# Patient Record
Sex: Male | Born: 1960
Health system: Southern US, Community
[De-identification: ages and names within clinical notes are randomized; demographics above are authoritative.]

## PROBLEM LIST (undated history)

## (undated) DIAGNOSIS — I5030 Unspecified diastolic (congestive) heart failure: Secondary | ICD-10-CM

## (undated) DIAGNOSIS — D696 Thrombocytopenia, unspecified: Secondary | ICD-10-CM

## (undated) DIAGNOSIS — K7460 Unspecified cirrhosis of liver: Secondary | ICD-10-CM

## (undated) DIAGNOSIS — N183 Chronic kidney disease, stage 3 unspecified: Secondary | ICD-10-CM

## (undated) DIAGNOSIS — I10 Essential (primary) hypertension: Secondary | ICD-10-CM

## (undated) DIAGNOSIS — I2510 Atherosclerotic heart disease of native coronary artery without angina pectoris: Secondary | ICD-10-CM

## (undated) DIAGNOSIS — M1990 Unspecified osteoarthritis, unspecified site: Secondary | ICD-10-CM

## (undated) DIAGNOSIS — D649 Anemia, unspecified: Secondary | ICD-10-CM

## (undated) DIAGNOSIS — G4733 Obstructive sleep apnea (adult) (pediatric): Secondary | ICD-10-CM

## (undated) DIAGNOSIS — D890 Polyclonal hypergammaglobulinemia: Secondary | ICD-10-CM

## (undated) DIAGNOSIS — E785 Hyperlipidemia, unspecified: Secondary | ICD-10-CM

## (undated) DIAGNOSIS — Z9989 Dependence on other enabling machines and devices: Secondary | ICD-10-CM

## (undated) DIAGNOSIS — E119 Type 2 diabetes mellitus without complications: Secondary | ICD-10-CM

## (undated) DIAGNOSIS — N182 Chronic kidney disease, stage 2 (mild): Secondary | ICD-10-CM

## (undated) HISTORY — PX: KNEE ARTHROSCOPY: SUR90

## (undated) HISTORY — DX: Obstructive sleep apnea (adult) (pediatric): G47.33

## (undated) HISTORY — DX: Polyclonal hypergammaglobulinemia: D89.0

## (undated) HISTORY — DX: Essential (primary) hypertension: I10

## (undated) HISTORY — DX: Dependence on other enabling machines and devices: Z99.89

## (undated) HISTORY — DX: Type 2 diabetes mellitus without complications: E11.9

## (undated) HISTORY — DX: Hyperlipidemia, unspecified: E78.5

## (undated) HISTORY — DX: Chronic kidney disease, stage 3 unspecified: N18.30

## (undated) HISTORY — PX: OTHER SURGICAL HISTORY: SHX169

## (undated) HISTORY — DX: Unspecified cirrhosis of liver: K74.60

## (undated) HISTORY — PX: APPENDECTOMY: SHX54

## (undated) HISTORY — DX: Unspecified diastolic (congestive) heart failure: I50.30

## (undated) HISTORY — DX: Atherosclerotic heart disease of native coronary artery without angina pectoris: I25.10

## (undated) HISTORY — DX: Chronic kidney disease, stage 2 (mild): N18.2

---
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
## 2003-09-19 ENCOUNTER — Ambulatory Visit (HOSPITAL_COMMUNITY): Admission: RE | Admit: 2003-09-19 | Discharge: 2003-09-19 | Payer: Self-pay | Admitting: Family Medicine

## 2003-09-19 ENCOUNTER — Encounter: Payer: Self-pay | Admitting: Family Medicine

## 2004-01-31 ENCOUNTER — Ambulatory Visit (HOSPITAL_COMMUNITY): Admission: RE | Admit: 2004-01-31 | Discharge: 2004-01-31 | Payer: Self-pay | Admitting: Family Medicine

## 2004-03-25 ENCOUNTER — Ambulatory Visit (HOSPITAL_COMMUNITY): Admission: RE | Admit: 2004-03-25 | Discharge: 2004-03-25 | Payer: Self-pay | Admitting: Orthopedic Surgery

## 2004-03-30 ENCOUNTER — Encounter (HOSPITAL_COMMUNITY): Admission: RE | Admit: 2004-03-30 | Discharge: 2004-04-29 | Payer: Self-pay | Admitting: Orthopedic Surgery

## 2010-04-16 ENCOUNTER — Encounter: Payer: Self-pay | Admitting: Internal Medicine

## 2010-04-29 ENCOUNTER — Telehealth (INDEPENDENT_AMBULATORY_CARE_PROVIDER_SITE_OTHER): Payer: Self-pay

## 2010-05-01 ENCOUNTER — Ambulatory Visit: Payer: Self-pay | Admitting: Internal Medicine

## 2010-05-01 ENCOUNTER — Ambulatory Visit (HOSPITAL_COMMUNITY): Admission: RE | Admit: 2010-05-01 | Discharge: 2010-05-01 | Payer: Self-pay | Admitting: Internal Medicine

## 2011-01-12 NOTE — Letter (Signed)
Summary: Internal Other Lynne Logan  Internal Other Lynne Logan   Imported By: Waldon Merl LPN [id number]  _____________________________________________________________________  External Attachment:    Type:   Image     Comment:   External Document

## 2011-01-12 NOTE — Progress Notes (Signed)
Summary: phone call/ ? need of antibiotics prior to TCS  Phone Note Call from Patient   Caller: Patient Summary of Call: Pt called to inquire about need of antibiotics prior to to TCS on Friday. He had rheumatic fever and hole in heart as a child. OK now, but dentist always requires antibiotics prior to dental procedures as precaution. Please advise. (Call back number today is [phone]). Initial call taken by: Waldon Merl LPN,  May 18, [id number] AM     Appended Document: phone call/ ? need of antibiotics prior to TCS yes for dental procedures bu no for endoscopy  Appended Document: phone call/ ? need of antibiotics prior to TCS Pt informed.

## 2011-03-01 LAB — GLUCOSE, CAPILLARY: Glucose-Capillary: 184 mg/dL — ABNORMAL HIGH (ref 70–99)

## 2011-03-16 ENCOUNTER — Other Ambulatory Visit (HOSPITAL_COMMUNITY): Payer: Self-pay | Admitting: Family Medicine

## 2011-03-16 ENCOUNTER — Ambulatory Visit (HOSPITAL_COMMUNITY)
Admission: RE | Admit: 2011-03-16 | Discharge: 2011-03-16 | Disposition: A | Discharge status: Home / Self Care | Payer: BC Managed Care – PPO | Source: Ambulatory Visit | Attending: Family Medicine | Admitting: Family Medicine

## 2011-03-16 DIAGNOSIS — M898X9 Other specified disorders of bone, unspecified site: Secondary | ICD-10-CM | POA: Insufficient documentation

## 2011-03-16 DIAGNOSIS — R52 Pain, unspecified: Secondary | ICD-10-CM

## 2011-03-16 DIAGNOSIS — M25569 Pain in unspecified knee: Secondary | ICD-10-CM | POA: Insufficient documentation

## 2011-04-30 NOTE — H&P (Signed)
NAME:  [name], [name] NO.:  [id number]   MEDICAL RECORD NO.:  [id number]                  PATIENT TYPE:   LOCATION:                                       FACILITY:   PHYSICIAN:  Carole Civil, M.D.           DATE OF BIRTH:   DATE OF ADMISSION:  DATE OF DISCHARGE:                                HISTORY & PHYSICAL   CHIEF COMPLAINT:  Knee pain.   HISTORY:  This is a gentleman who was referred to me by Dr. Hilma Favors for  consultation regarding his left knee.  His MRI showed a questionable medial  meniscal tear and osteoarthritis.  His pain was gradual in onset and  activity-related and was without mechanical symptoms.  However, since I saw  him on February 13, 2004 his pain has gotten worse.  He stepped wrong, he  twisted the knee, and the symptoms became mechanical.   His medical history is as follows:  He has diabetes, hypertension.  He has  had an appendectomy, a ruptured biceps tendon on the right, a pinched nerve  in the right hand.  He is currently on Actos 45 mg a day, Uniretic 12/12.5,  glyburide with metformin 2.5/500, and Vytorin.  He has a family history of  heart disease.  Marital status:  He is married.  He works as a Radio broadcast assistant.  He has no social habits.  Caffeine use:  Yes.  Highest grade  completed:  12 plus 3 years of post high school education.   REVIEW OF SYSTEMS:  Fever, diarrhea, diabetes.   PHYSICAL EXAMINATION:  VITAL SIGNS:  Weight 280.  Vital signs will be  recorded at the time of admission.  APPEARANCE:  He is a robust male, no deformities.  Grooming and hygiene  normal.  He is well developed and nourished.  PULSES:  Intact.  MUSCULOSKELETAL:  His extremities are warm.  No edema, tenderness, swelling,  or varicosities.  His gait and station is remarkable for a limp favoring the  left lower extremity.  His range of motion has remained good throughout.  His stability is normal.  His muscle strength and tone is normal.   He has  medial joint line tenderness, medial condylar tenderness suggesting chondral  lesion.  SKIN:  Intact without rash, lesion, or ulcer.  NEUROLOGIC:  Normal.  He is alert and oriented x3.   Data reviewed included MRI of the left knee which showed a degenerative-type  signal in the medial meniscus, degenerative changes of the medial  compartment.   We are recommending arthroscopy.  He has given informed consent.  He  understands his arthritis may hinder him from having a normal knee.  This  can be treated later with arthritic medications, viscosupplementation, and  oral supplements.   CPT [phone].  Planned procedure:  Meniscectomy, ICD-9 code 717.2 and 715.16.     ___________________________________________  Carole Civil, M.D.   SEH/MEDQ  D:  03/12/2004  T:  03/12/2004  Job:  [id number]

## 2011-04-30 NOTE — Op Note (Signed)
NAME:  AYDIAN, EAVEY                     ACCOUNT NO.:  [id number]   MEDICAL RECORD NO.:  [id number]                   PATIENT TYPE:  AMB   LOCATION:  DAY                                  FACILITY:  [facility]   PHYSICIAN:  Carole Civil, M.D.           DATE OF BIRTH:  [birth year]   DATE OF PROCEDURE:  DATE OF DISCHARGE:                                 OPERATIVE REPORT   PREOPERATIVE DIAGNOSIS:  Medial meniscal tear left knee.   POSTOPERATIVE DIAGNOSES:  1. Medial meniscal tear left knee.  2. Osteoarthritis left knee.   PROCEDURE:   SURGEON:  Carole Civil, M.D.   ANESTHETIC:  General.   OPERATIVE FINDINGS:  Torn medial meniscus of the left knee and mild-to-  moderate degenerative changes of the medial compartment.   HISTORY:  This is a 50 year old male who had several months of knee pain.  He tried to put the surgical procedure off as long as possible, but began to  have more mechanical symptoms and pain and presented for arthroscopy.   The patient was identified in the holding area as Moreen Fowler by arm  band.  His left knee was marked as the surgical site and signed with my  initials.  His medical record and consent both indicated that the left knee  was the surgical site and that arthroscopy was to be done.  He was given  Ancef, taken to the operating room for general anesthesia at which time a  sterile prep and drape was done.   We took a time out; all in the operating room agreed with the procedure;  that the correct extremity was prepped and that the patient was, indeed,  Moreen Fowler.   After diagnostic arthroscopy we found a medial meniscal tear.  All other  structures were normal except for degenerative changes of the medial  compartment and patella.  The meniscus was addressed with a duckbill  forceps.  The tear was resected and the meniscus was balanced with a  straight synovator and meniscal blade  the knee was  irrigated; suctioned dry.   Steri-Strips were then applied.  We injected the  knee with 30 cc of Marcaine.  Applied a CryoCuff.  Extubated him and took  him to recovery room in stable condition.   He will follow up in 2 days; start therapy in 4 days.      ___________________________________________                                            Carole Civil, M.D.   SEH/MEDQ  D:  03/26/2004  T:  03/26/2004  Job:  [id number]

## 2011-08-19 ENCOUNTER — Encounter: Payer: Self-pay | Admitting: Orthopedic Surgery

## 2011-08-19 ENCOUNTER — Ambulatory Visit (INDEPENDENT_AMBULATORY_CARE_PROVIDER_SITE_OTHER): Payer: Federal, State, Local not specified - PPO | Admitting: Orthopedic Surgery

## 2011-08-19 VITALS — Resp 16 | Ht 68.5 in | Wt 295.0 lb

## 2011-08-19 DIAGNOSIS — IMO0002 Reserved for concepts with insufficient information to code with codable children: Secondary | ICD-10-CM

## 2011-08-19 DIAGNOSIS — S83206A Unspecified tear of unspecified meniscus, current injury, right knee, initial encounter: Secondary | ICD-10-CM | POA: Insufficient documentation

## 2011-08-19 DIAGNOSIS — M67919 Unspecified disorder of synovium and tendon, unspecified shoulder: Secondary | ICD-10-CM

## 2011-08-19 DIAGNOSIS — M75102 Unspecified rotator cuff tear or rupture of left shoulder, not specified as traumatic: Secondary | ICD-10-CM | POA: Insufficient documentation

## 2011-08-19 MED ORDER — METHYLPREDNISOLONE ACETATE 40 MG/ML IJ SUSP: 40.0000 mg | Freq: Once | INTRAMUSCULAR | Status: DC

## 2011-08-19 NOTE — Progress Notes (Signed)
Chief complaint: Pain shoulder and knee HPI:(46) A 50 year old male who had a LEFT knee arthroscopy that I did several years ago presents with atraumatic onset of LEFT shoulder pain and RIGHT knee pain.  All addresses LEFT shoulder first.  He reports an injury in 1981 with a dislocated shoulder which was treated by closed methods.  He has sharp dull throbbing stabbing pain 9/10 in intensity, constant worse with activity and painful at night when he sleeps on his LEFT side.  The RIGHT knee is noted to have dull throbbing pain with catching swelling difficulty sitting standing walking and difficulty getting out of a chair or seated position  Get a cortisone shot in his RIGHT knee and his LEFT shoulder a month ago he also had some Ultracet for pain  His pain level again is 9/10.  ROS:(2) Review of systems positive for fever and fatigue, heart murmur, cough, seasonal ALLERGIES.  PFSH: (1)  Past Surgical History  Procedure Date  . Appendectomy   . Right biceps tendon   . Right hand nerve damage   . Left knee arthroscopy   . Knee arthroscopy left    Past Medical History  Diagnosis Date  . HTN (hypertension)   . Diabetes mellitus   . High cholesterol       Physical Exam(12) GENERAL: normal development, Obesity but is recently lost 30 pounds by dieting.  CDV: pulses are normal In both lower extremities  Skin: normal In both limbs.  Lymph: nodes were not palpable/normal  Psychiatric: awake, alert and oriented  Neuro: normal sensation Bilaterally in the lower extremities.  MSK He actually ambulates normally 1 LEFT shoulder, tenderness in the soft tissue in the sulcus between the acromion and his lower border.  Active range of motion normal external rotation, internal rotation is to the level of T12, forward elevation is 130 2 Strength assessment shows weakness of the supraspinatus tendon.  Apprehension test for stability is normal. 3 Neer test for impingement positive.   Deceleration test was normal. 4 RIGHT knee flexion 120, medial joint line tenderness, small joint effusion. 5 Strength and muscle tone normal, ligament stability normal 6 McMurray sign positive for medial joint line pain and clicking  Imaging: Shoulder and knee films were done at [facility].  Knee film shows arthritis.  Shoulder film was normal.  Assessment: #1 impingement syndrome LEFT shoulder.  #2 meniscal tear with arthritis RIGHT knee    Plan: Inject LEFT shoulder subacromial space, MRI RIGHT knee.  Subacromial Shoulder Injection Procedure Note  Pre-operative Diagnosis: left RC Syndrome  Post-operative Diagnosis: same  Indications: pain   Anesthesia: ethyl chloride   Procedure Details   Verbal consent was obtained for the procedure. The shoulder was prepped withalcohol and the skin was anesthetized. A 20 gauge needle was advanced into the subacromial space through posterior approach without difficulty  The space was then injected with 3 ml 1% lidocaine and 1 ml of depomedrol. The injection site was cleansed with isopropyl alcohol and a dressing was applied.  Complications:  None; patient tolerated the procedure well.

## 2011-08-19 NOTE — Patient Instructions (Signed)
We will refer you for MRI  Come back for results

## 2011-08-30 ENCOUNTER — Telehealth: Payer: Self-pay | Admitting: Orthopedic Surgery

## 2011-08-30 NOTE — Telephone Encounter (Signed)
Patient called to confirm, states he received the message for his MRI appointment for Wed, 09/01/11, registration time 4:30pm at [facility].  He has scheduled his follow up appointment here also.

## 2011-09-01 ENCOUNTER — Ambulatory Visit (HOSPITAL_COMMUNITY)
Admission: RE | Admit: 2011-09-01 | Discharge: 2011-09-01 | Disposition: A | Discharge status: Home / Self Care | Payer: Federal, State, Local not specified - PPO | Source: Ambulatory Visit | Attending: Orthopedic Surgery | Admitting: Orthopedic Surgery

## 2011-09-01 DIAGNOSIS — M23329 Other meniscus derangements, posterior horn of medial meniscus, unspecified knee: Secondary | ICD-10-CM | POA: Insufficient documentation

## 2011-09-01 DIAGNOSIS — M25569 Pain in unspecified knee: Secondary | ICD-10-CM | POA: Insufficient documentation

## 2011-09-01 DIAGNOSIS — S83206A Unspecified tear of unspecified meniscus, current injury, right knee, initial encounter: Secondary | ICD-10-CM

## 2011-09-07 ENCOUNTER — Encounter: Payer: Self-pay | Admitting: Orthopedic Surgery

## 2011-09-07 ENCOUNTER — Ambulatory Visit (INDEPENDENT_AMBULATORY_CARE_PROVIDER_SITE_OTHER): Payer: Federal, State, Local not specified - PPO | Admitting: Orthopedic Surgery

## 2011-09-07 VITALS — Ht 68.5 in | Wt 295.0 lb

## 2011-09-07 DIAGNOSIS — M23329 Other meniscus derangements, posterior horn of medial meniscus, unspecified knee: Secondary | ICD-10-CM

## 2011-09-07 DIAGNOSIS — IMO0002 Reserved for concepts with insufficient information to code with codable children: Secondary | ICD-10-CM

## 2011-09-07 DIAGNOSIS — M23302 Other meniscus derangements, unspecified lateral meniscus, unspecified knee: Secondary | ICD-10-CM

## 2011-09-07 DIAGNOSIS — M1710 Unilateral primary osteoarthritis, unspecified knee: Secondary | ICD-10-CM

## 2011-09-07 DIAGNOSIS — M179 Osteoarthritis of knee, unspecified: Secondary | ICD-10-CM | POA: Insufficient documentation

## 2011-09-07 MED ORDER — TRAMADOL-ACETAMINOPHEN 37.5-325 MG PO TABS: 1.0000 | ORAL_TABLET | ORAL | Status: AC | PRN

## 2011-09-07 NOTE — Progress Notes (Signed)
Followup  MRI.  RIGHT knee pain.  Clinical 2 preop visit. See preoperative history  Troponin history has been reviewed.  MRI review.  RIGHT knee torn medial meniscus, torn lateral meniscus 3 compartment arthritis.  We discussed arthroscopic treatment short-term total knee replacement long-term.

## 2011-09-13 ENCOUNTER — Other Ambulatory Visit (HOSPITAL_COMMUNITY): Payer: Self-pay | Admitting: Physician Assistant

## 2011-09-13 ENCOUNTER — Ambulatory Visit (HOSPITAL_COMMUNITY)
Admission: RE | Admit: 2011-09-13 | Discharge: 2011-09-13 | Disposition: A | Discharge status: Home / Self Care | Payer: Federal, State, Local not specified - PPO | Source: Ambulatory Visit | Attending: Physician Assistant | Admitting: Physician Assistant

## 2011-09-13 DIAGNOSIS — R059 Cough, unspecified: Secondary | ICD-10-CM

## 2011-09-13 DIAGNOSIS — R05 Cough: Secondary | ICD-10-CM

## 2011-10-05 ENCOUNTER — Encounter: Payer: Self-pay | Admitting: Orthopedic Surgery

## 2011-10-05 ENCOUNTER — Ambulatory Visit (INDEPENDENT_AMBULATORY_CARE_PROVIDER_SITE_OTHER): Payer: Federal, State, Local not specified - PPO | Admitting: Orthopedic Surgery

## 2011-10-05 DIAGNOSIS — IMO0002 Reserved for concepts with insufficient information to code with codable children: Secondary | ICD-10-CM

## 2011-10-05 DIAGNOSIS — M179 Osteoarthritis of knee, unspecified: Secondary | ICD-10-CM

## 2011-10-05 DIAGNOSIS — M1710 Unilateral primary osteoarthritis, unspecified knee: Secondary | ICD-10-CM

## 2011-10-05 DIAGNOSIS — M23329 Other meniscus derangements, posterior horn of medial meniscus, unspecified knee: Secondary | ICD-10-CM

## 2011-10-05 NOTE — Progress Notes (Signed)
   [facility]   MRI:  IMPRESSION:  1. Tricompartmental osteoarthritis, worst in the medial  compartment. There is associated extensive tearing of the  posterior horn and body of the medial meniscus.  2. Severe mucoid degeneration of the anterior cruciate ligament.

## 2011-10-05 NOTE — Patient Instructions (Addendum)
You have been scheduled for surgery.  All surgeries carry some risk.  Remember you always have the option of continued nonsurgical treatment. However in this situation the risks vs. the benefits favor surgery as the best treatment option. The risks of the surgery includes the following but is not limited to bleeding, infection, pulmonary embolus, death from anesthesia, nerve injury vascular injury or need for further surgery, continued pain.  Specific to this procedure the following risks and complications are rare but possible Success of surgery in arthritic knees is 90% + for meniscal pathology but decreases to 60% for arthritis   Other complications include swelling stiffness and blood clots   You have been scheduled for surgery  Please Go to your preoperative appointment and bring the folder that was given to you today  Please stop all blood thinners ( ibuprofen, Naprosyn, aspirin, Plavix, Coumadin ) 1 week before sugrery

## 2011-10-14 ENCOUNTER — Telehealth: Payer: Self-pay | Admitting: Orthopedic Surgery

## 2011-10-14 NOTE — Telephone Encounter (Signed)
[facility], ph#[phone], re: out-patient surgery scheduled 10/22/11 at [facility]. [place], [phone]. Per Seleta Rhymes, utilization review, no pre-authorization required for out-patient procedures, per federal plan guidelines. His name and today's date for reference.

## 2011-10-15 ENCOUNTER — Other Ambulatory Visit: Payer: Self-pay

## 2011-10-15 ENCOUNTER — Encounter (HOSPITAL_COMMUNITY): Payer: Self-pay | Admitting: Pharmacy Technician

## 2011-10-15 ENCOUNTER — Encounter (HOSPITAL_COMMUNITY): Payer: Self-pay

## 2011-10-15 ENCOUNTER — Encounter (HOSPITAL_COMMUNITY)
Admission: RE | Admit: 2011-10-15 | Discharge: 2011-10-15 | Disposition: A | Discharge status: Home / Self Care | Payer: Federal, State, Local not specified - PPO | Source: Ambulatory Visit | Attending: Orthopedic Surgery | Admitting: Orthopedic Surgery

## 2011-10-15 HISTORY — DX: Unspecified osteoarthritis, unspecified site: M19.90

## 2011-10-15 LAB — BASIC METABOLIC PANEL
BUN: 13 mg/dL (ref 6–23)
CO2: 27 mEq/L (ref 19–32)
Calcium: 9.3 mg/dL (ref 8.4–10.5)
Chloride: 96 mEq/L (ref 96–112)
Creatinine, Ser: 1.02 mg/dL (ref 0.50–1.35)
GFR calc Af Amer: >90 mL/min (ref >90)
GFR calc non Af Amer: 83 mL/min — ABNORMAL LOW (ref >90)
Glucose, Bld: 202 mg/dL — ABNORMAL HIGH (ref 70–99)
Potassium: 3.7 mEq/L (ref 3.5–5.1)
Sodium: 133 mEq/L — ABNORMAL LOW (ref 135–145)

## 2011-10-15 LAB — CBC
HCT: 41.9 % (ref 39.0–52.0)
Hemoglobin: 14.8 g/dL (ref 13.0–17.0)
MCH: 31.6 pg (ref 26.0–34.0)
MCHC: 35.3 g/dL (ref 30.0–36.0)
MCV: 89.5 fL (ref 78.0–100.0)
Platelets: 181 10*3/uL (ref 150–400)
RBC: 4.68 MIL/uL (ref 4.22–5.81)
RDW: 12.9 % (ref 11.5–15.5)
WBC: 5.1 10*3/uL (ref 4.0–10.5)

## 2011-10-15 LAB — SURGICAL PCR SCREEN
MRSA, PCR: NEGATIVE
Staphylococcus aureus: NEGATIVE

## 2011-10-15 NOTE — Patient Instructions (Addendum)
[name]  10/15/2011   Your procedure is scheduled on:  10/22/2011  Report to [facility] at  61  AM.  Call this number if you have problems the morning of surgery: [phone]   Remember:   Do not eat food:After Midnight.  Do not drink clear liquids: After Midnight.  Take these medicines the morning of surgery with A SIP OF WATER: zantac,ultracet   Do not wear jewelry, make-up or nail polish.  Do not wear lotions, powders, or perfumes. You may wear deodorant.  Do not shave 48 hours prior to surgery.  Do not bring valuables to the hospital.  Contacts, dentures or bridgework may not be worn into surgery.  Leave suitcase in the car. After surgery it may be brought to your room.  For patients admitted to the hospital, checkout time is 11:00 AM the day of discharge.   Patients discharged the day of surgery will not be allowed to drive home.  Name and phone number of your driver: family  Special Instructions: CHG Shower Use Special Wash: 1/2 bottle night before surgery and 1/2 bottle morning of surgery.   Please read over the following fact sheets that you were given: Pain Booklet, MRSA Information, Surgical Site Infection Prevention, Anesthesia Post-op Instructions and Care and Recovery After Surgery Arthroscopic Procedure, Knee An arthroscopic procedure can find what is wrong with your knee. PROCEDURE Arthroscopy is a surgical technique that allows your orthopedic surgeon to diagnose and treat your knee injury with accuracy. They will look into your knee through a small instrument. This is almost like a small (pencil sized) telescope. Because arthroscopy affects your knee less than open knee surgery, you can anticipate a more rapid recovery. Taking an active role by following your caregiver's instructions will help with rapid and complete recovery. Use crutches, rest, elevation, ice, and knee exercises as instructed. The length of recovery depends on various factors including type  of injury, age, physical condition, medical conditions, and your rehabilitation. Your knee is the joint between the large bones (femur and tibia) in your leg. Cartilage covers these bone ends which are smooth and slippery and allow your knee to bend and move smoothly. Two menisci, thick, semi-lunar shaped pads of cartilage which form a rim inside the joint, help absorb shock and stabilize your knee. Ligaments bind the bones together and support your knee joint. Muscles move the joint, help support your knee, and take stress off the joint itself. Because of this all programs and physical therapy to rehabilitate an injured or repaired knee require rebuilding and strengthening your muscles. AFTER THE PROCEDURE  After the procedure, you will be moved to a recovery area until most of the effects of the medication have worn off. Your caregiver will discuss the test results with you.   Only take over-the-counter or prescription medicines for pain, discomfort, or fever as directed by your caregiver.  SEEK MEDICAL CARE IF:   You have increased bleeding from your wounds.   You see redness, swelling, or have increasing pain in your wounds.   You have pus coming from your wound.   You have an oral temperature above 102 F (38.9 C).   You notice a bad smell coming from the wound or dressing.   You have severe pain with any motion of your knee.  SEEK IMMEDIATE MEDICAL CARE IF:   You develop a rash.   You have difficulty breathing.   You have any allergic problems.  Document Released: 11/26/2000 Document Revised:  08/11/2011 Document Reviewed: 06/19/2008 [facility] Patient Information 2012 [place].PATIENT INSTRUCTIONS POST-ANESTHESIA  IMMEDIATELY FOLLOWING SURGERY:  Do not drive or operate machinery for the first twenty four hours after surgery.  Do not make any important decisions for twenty four hours after surgery or while taking narcotic pain medications or sedatives.  If you develop  intractable nausea and vomiting or a severe headache please notify your doctor immediately.  FOLLOW-UP:  Please make an appointment with your surgeon as instructed. You do not need to follow up with anesthesia unless specifically instructed to do so.  WOUND CARE INSTRUCTIONS (if applicable):  Keep a dry clean dressing on the anesthesia/puncture wound site if there is drainage.  Once the wound has quit draining you may leave it open to air.  Generally you should leave the bandage intact for twenty four hours unless there is drainage.  If the epidural site drains for more than 36-48 hours please call the anesthesia department.  QUESTIONS?:  Please feel free to call your physician or the hospital operator if you have any questions, and they will be happy to assist you.     [place] [phone]

## 2011-10-21 NOTE — H&P (Signed)
Chief complaint RIGHT knee pain  The RIGHT knee is noted to have dull throbbing pain with catching swelling difficulty sitting standing walking and difficulty getting out of a chair or seated position   His pain level again is 9/10.   He did have an MRI which showed that he has torn cartilage and arthritis in his knee.  He is unwilling to continue with his knee the way it is and would like to schedule surgery  He understands that his success is dependent on the amount of arthritis in his knee and his activity level as well as his weight loss or weight gain.  History   Social History  . Marital Status: Married    Spouse Name: N/A    Number of Children: N/A  . Years of Education: college   Occupational History  . maintenance tech    Social History Main Topics  . Smoking status: Never Smoker   . Smokeless tobacco: Not on file  . Alcohol Use: No  . Drug Use: No  . Sexually Active: Not on file   Other Topics Concern  . Not on file   Social History Narrative  . No narrative on file   ROS:(2) Review of systems positive for fever and fatigue, heart murmur, cough, seasonal ALLERGIES.  PFSH: (1)  Past Surgical History   Procedure  Date   .  Appendectomy    .  Right biceps tendon    .  Right hand nerve damage    .  Left knee arthroscopy    .  Knee arthroscopy  left    Past Medical History   Diagnosis  Date   .  HTN (hypertension)    .  Diabetes mellitus    .  High cholesterol      Physical Exam GENERAL: normal development, Obesity but is recently lost 30 pounds by dieting.  CDV: pulses are normal In both lower extremities  Skin: normal In both limbs.  Lymph: nodes were not palpable/normal  Psychiatric: awake, alert and oriented  Neuro: normal sensation Bilaterally in the lower extremities.  MSK He actually ambulates normally  1 LEFT shoulder, tenderness in the soft tissue in the sulcus between the acromion and his lower border. Active range of motion normal external  rotation, internal rotation is to the level of T12, forward elevation is 130  2 Strength assessment shows weakness of the supraspinatus tendon. Apprehension test for stability is normal.  3 Neer test for impingement positive. Deceleration test was normal.  4 RIGHT knee flexion 120, medial joint line tenderness, small joint effusion.  5 Strength and muscle tone normal, ligament stability normal  6 McMurray sign positive for medial joint line pain and clicking   Knee film shows arthritis.   MRI report 1. Tricompartmental osteoarthritis, worst in the medial  compartment. There is associated extensive tearing of the  posterior horn and body of the medial meniscus.  2. Severe mucoid degeneration of the anterior cruciate ligament.  Diagnosis torn medial meniscus and osteoarthritis RIGHT knee  Plan arthroscopy RIGHT knee and partial medial meniscectomy

## 2011-10-22 ENCOUNTER — Encounter (HOSPITAL_COMMUNITY): Payer: Self-pay | Admitting: Anesthesiology

## 2011-10-22 ENCOUNTER — Encounter (HOSPITAL_COMMUNITY): Payer: Self-pay | Admitting: *Deleted

## 2011-10-22 ENCOUNTER — Ambulatory Visit (HOSPITAL_COMMUNITY)
Admission: RE | Admit: 2011-10-22 | Discharge: 2011-10-22 | Disposition: A | Discharge status: Home / Self Care | Payer: Federal, State, Local not specified - PPO | Source: Ambulatory Visit | Attending: Orthopedic Surgery | Admitting: Orthopedic Surgery

## 2011-10-22 ENCOUNTER — Encounter (HOSPITAL_COMMUNITY)
Admission: RE | Disposition: A | Discharge status: Home / Self Care | Payer: Self-pay | Source: Ambulatory Visit | Attending: Orthopedic Surgery

## 2011-10-22 DIAGNOSIS — Z79899 Other long term (current) drug therapy: Secondary | ICD-10-CM | POA: Insufficient documentation

## 2011-10-22 DIAGNOSIS — IMO0002 Reserved for concepts with insufficient information to code with codable children: Secondary | ICD-10-CM

## 2011-10-22 DIAGNOSIS — Z794 Long term (current) use of insulin: Secondary | ICD-10-CM | POA: Insufficient documentation

## 2011-10-22 DIAGNOSIS — Z01810 Encounter for preprocedural cardiovascular examination: Secondary | ICD-10-CM | POA: Insufficient documentation

## 2011-10-22 DIAGNOSIS — Z01812 Encounter for preprocedural laboratory examination: Secondary | ICD-10-CM | POA: Insufficient documentation

## 2011-10-22 DIAGNOSIS — S83206A Unspecified tear of unspecified meniscus, current injury, right knee, initial encounter: Secondary | ICD-10-CM

## 2011-10-22 DIAGNOSIS — M23329 Other meniscus derangements, posterior horn of medial meniscus, unspecified knee: Secondary | ICD-10-CM | POA: Insufficient documentation

## 2011-10-22 DIAGNOSIS — I10 Essential (primary) hypertension: Secondary | ICD-10-CM | POA: Insufficient documentation

## 2011-10-22 DIAGNOSIS — E119 Type 2 diabetes mellitus without complications: Secondary | ICD-10-CM | POA: Insufficient documentation

## 2011-10-22 LAB — GLUCOSE, CAPILLARY: Glucose-Capillary: 232 mg/dL — ABNORMAL HIGH (ref 70–99)

## 2011-10-22 SURGERY — ARTHROSCOPY, KNEE, WITH MEDIAL MENISCECTOMY
Anesthesia: General | Site: Knee | Laterality: Right | Wound class: Clean

## 2011-10-22 MED ORDER — CELECOXIB 100 MG PO CAPS
ORAL_CAPSULE | ORAL | Status: DC
Start: 2011-10-22 — End: 2011-10-22
  Filled 2011-10-22: qty 3

## 2011-10-22 MED ORDER — ONDANSETRON HCL 4 MG/2ML IJ SOLN
4.0000 mg | Freq: Once | INTRAMUSCULAR | Status: AC
  Administered 2011-10-22: 4 mg via INTRAVENOUS

## 2011-10-22 MED ORDER — LACTATED RINGERS IV SOLN
INTRAVENOUS | Status: DC
  Administered 2011-10-22: 1000 mL via INTRAVENOUS

## 2011-10-22 MED ORDER — HYDROCODONE-ACETAMINOPHEN 10-325 MG PO TABS: 1.0000 | ORAL_TABLET | ORAL | Status: AC | PRN

## 2011-10-22 MED ORDER — ONDANSETRON HCL 4 MG/2ML IJ SOLN
INTRAMUSCULAR | Status: AC
  Administered 2011-10-22: 4 mg via INTRAVENOUS
  Filled 2011-10-22: qty 2

## 2011-10-22 MED ORDER — LACTATED RINGERS IV SOLN: INTRAVENOUS | Status: DC

## 2011-10-22 MED ORDER — CEFAZOLIN SODIUM 1-5 GM-% IV SOLN
INTRAVENOUS | Status: AC
  Filled 2011-10-22: qty 50

## 2011-10-22 MED ORDER — SUCCINYLCHOLINE CHLORIDE 20 MG/ML IJ SOLN
INTRAMUSCULAR | Status: DC | PRN
  Administered 2011-10-22: 140 mg via INTRAVENOUS

## 2011-10-22 MED ORDER — FENTANYL CITRATE 0.05 MG/ML IJ SOLN
INTRAMUSCULAR | Status: AC
  Filled 2011-10-22: qty 5

## 2011-10-22 MED ORDER — PHENYLEPHRINE HCL 10 MG/ML IJ SOLN
INTRAMUSCULAR | Status: AC
  Filled 2011-10-22: qty 1

## 2011-10-22 MED ORDER — CELECOXIB 100 MG PO CAPS
400.0000 mg | ORAL_CAPSULE | Freq: Once | ORAL | Status: AC
  Administered 2011-10-22: 400 mg via ORAL

## 2011-10-22 MED ORDER — HYDROCODONE-ACETAMINOPHEN 5-325 MG PO TABS
ORAL_TABLET | ORAL | Status: AC
  Administered 2011-10-22: 1 via ORAL
  Filled 2011-10-22: qty 1

## 2011-10-22 MED ORDER — CEFAZOLIN SODIUM 1-5 GM-% IV SOLN
INTRAVENOUS | Status: DC | PRN
  Administered 2011-10-22: 1 g via INTRAVENOUS

## 2011-10-22 MED ORDER — MIDAZOLAM HCL 2 MG/2ML IJ SOLN
INTRAMUSCULAR | Status: AC
  Administered 2011-10-22: 2 mg via INTRAVENOUS
  Filled 2011-10-22: qty 2

## 2011-10-22 MED ORDER — FENTANYL CITRATE 0.05 MG/ML IJ SOLN
INTRAMUSCULAR | Status: DC | PRN
  Administered 2011-10-22 (×3): 50 ug via INTRAVENOUS

## 2011-10-22 MED ORDER — PROPOFOL 10 MG/ML IV EMUL
INTRAVENOUS | Status: DC | PRN
  Administered 2011-10-22: 180 mg via INTRAVENOUS
  Administered 2011-10-22: 20 mg via INTRAVENOUS

## 2011-10-22 MED ORDER — ACETAMINOPHEN 500 MG PO TABS
ORAL_TABLET | ORAL | Status: AC
  Administered 2011-10-22: 500 mg via ORAL
  Filled 2011-10-22: qty 1

## 2011-10-22 MED ORDER — PROMETHAZINE HCL 12.5 MG PO TABS: 12.5000 mg | ORAL_TABLET | Freq: Four times a day (QID) | ORAL | Status: AC | PRN

## 2011-10-22 MED ORDER — SODIUM CHLORIDE 0.9 % IR SOLN
Status: DC | PRN
  Administered 2011-10-22: 08:00:00

## 2011-10-22 MED ORDER — ROCURONIUM BROMIDE 100 MG/10ML IV SOLN
INTRAVENOUS | Status: DC | PRN
  Administered 2011-10-22: 5 mg via INTRAVENOUS

## 2011-10-22 MED ORDER — KETOROLAC TROMETHAMINE 30 MG/ML IJ SOLN
INTRAMUSCULAR | Status: AC
  Filled 2011-10-22: qty 1

## 2011-10-22 MED ORDER — HYDROCODONE-ACETAMINOPHEN 5-325 MG PO TABS
1.0000 | ORAL_TABLET | Freq: Once | ORAL | Status: AC
  Administered 2011-10-22: 1 via ORAL

## 2011-10-22 MED ORDER — BUPIVACAINE-EPINEPHRINE PF 0.5-1:200000 % IJ SOLN
INTRAMUSCULAR | Status: DC | PRN
  Administered 2011-10-22: 60 mL

## 2011-10-22 MED ORDER — GLYCOPYRROLATE 0.2 MG/ML IJ SOLN
0.2000 mg | Freq: Once | INTRAMUSCULAR | Status: AC | PRN
  Administered 2011-10-22: 0.2 mg via INTRAVENOUS

## 2011-10-22 MED ORDER — MIDAZOLAM HCL 2 MG/2ML IJ SOLN
1.0000 mg | INTRAMUSCULAR | Status: DC | PRN
  Administered 2011-10-22: 2 mg via INTRAVENOUS

## 2011-10-22 MED ORDER — PROPOFOL 10 MG/ML IV EMUL
INTRAVENOUS | Status: AC
  Filled 2011-10-22: qty 20

## 2011-10-22 MED ORDER — ACETAMINOPHEN 325 MG PO TABS: 325.0000 mg | ORAL_TABLET | ORAL | Status: DC | PRN

## 2011-10-22 MED ORDER — EPINEPHRINE HCL 1 MG/ML IJ SOLN
INTRAMUSCULAR | Status: AC
  Filled 2011-10-22: qty 5

## 2011-10-22 MED ORDER — ACETAMINOPHEN 500 MG PO TABS
500.0000 mg | ORAL_TABLET | Freq: Once | ORAL | Status: AC
  Administered 2011-10-22: 500 mg via ORAL

## 2011-10-22 MED ORDER — ONDANSETRON HCL 4 MG/2ML IJ SOLN: 4.0000 mg | Freq: Once | INTRAMUSCULAR | Status: DC | PRN

## 2011-10-22 MED ORDER — BUPIVACAINE-EPINEPHRINE PF 0.5-1:200000 % IJ SOLN
INTRAMUSCULAR | Status: AC
  Filled 2011-10-22: qty 20

## 2011-10-22 MED ORDER — ROCURONIUM BROMIDE 50 MG/5ML IV SOLN
INTRAVENOUS | Status: AC
  Filled 2011-10-22: qty 1

## 2011-10-22 MED ORDER — FENTANYL CITRATE 0.05 MG/ML IJ SOLN: 25.0000 ug | INTRAMUSCULAR | Status: DC | PRN

## 2011-10-22 MED ORDER — CEFAZOLIN SODIUM 1-5 GM-% IV SOLN: 1.0000 g | INTRAVENOUS | Status: DC

## 2011-10-22 MED ORDER — CELECOXIB 100 MG PO CAPS
ORAL_CAPSULE | ORAL | Status: AC
  Administered 2011-10-22: 400 mg via ORAL
  Filled 2011-10-22: qty 1

## 2011-10-22 MED ORDER — LIDOCAINE HCL (PF) 1 % IJ SOLN
INTRAMUSCULAR | Status: AC
  Filled 2011-10-22: qty 5

## 2011-10-22 MED ORDER — LIDOCAINE HCL 1 % IJ SOLN
INTRAMUSCULAR | Status: DC | PRN
  Administered 2011-10-22: 40 mg via INTRADERMAL

## 2011-10-22 MED ORDER — KETOROLAC TROMETHAMINE 30 MG/ML IJ SOLN: 30.0000 mg | Freq: Once | INTRAMUSCULAR | Status: DC

## 2011-10-22 MED ORDER — SUCCINYLCHOLINE CHLORIDE 20 MG/ML IJ SOLN
INTRAMUSCULAR | Status: AC
  Filled 2011-10-22: qty 1

## 2011-10-22 MED ORDER — SODIUM CHLORIDE 0.9 % IR SOLN
Status: DC | PRN
  Administered 2011-10-22: 1000 mL

## 2011-10-22 MED ORDER — GLYCOPYRROLATE 0.2 MG/ML IJ SOLN
INTRAMUSCULAR | Status: AC
  Administered 2011-10-22: 0.2 mg via INTRAVENOUS
  Filled 2011-10-22: qty 1

## 2011-10-22 SURGICAL SUPPLY — 56 items
ARTHROWAND PARAGON T2 (SURGICAL WAND)
BAG HAMPER (MISCELLANEOUS) ×3 IMPLANT
BANDAGE ELASTIC 6 VELCRO NS (GAUZE/BANDAGES/DRESSINGS) ×3 IMPLANT
BLADE AGGRESSIVE PLUS 4.0 (BLADE) ×3 IMPLANT
BLADE SURG SZ11 CARB STEEL (BLADE) ×3 IMPLANT
CHLORAPREP W/TINT 26ML (MISCELLANEOUS) ×3 IMPLANT
CLOTH BEACON ORANGE TIMEOUT ST (SAFETY) ×3 IMPLANT
COOLER CRYO IC GRAV AND TUBE (ORTHOPEDIC SUPPLIES) ×3 IMPLANT
COVER PROBE W GEL 5X96 (DRAPES) ×3 IMPLANT
CUFF CRYO KNEE LG 20X31 COOLER (ORTHOPEDIC SUPPLIES) ×2 IMPLANT
CUFF CRYO KNEE18X23 MED (MISCELLANEOUS) IMPLANT
CUFF TOURNIQUET SINGLE 34IN LL (TOURNIQUET CUFF) ×2 IMPLANT
CUFF TOURNIQUET SINGLE 44IN (TOURNIQUET CUFF) IMPLANT
CUTTER ANGLED DBL BITE 4.5 (BURR) IMPLANT
DECANTER SPIKE VIAL GLASS SM (MISCELLANEOUS) ×6 IMPLANT
FLOOR PAD 36X40 (MISCELLANEOUS) ×3
GAUZE SPONGE 4X4 16PLY XRAY LF (GAUZE/BANDAGES/DRESSINGS) ×3 IMPLANT
GAUZE XEROFORM 5X9 LF (GAUZE/BANDAGES/DRESSINGS) ×3 IMPLANT
GLOVE BIOGEL PI IND STRL 7.0 (GLOVE) ×1 IMPLANT
GLOVE BIOGEL PI INDICATOR 7.0 (GLOVE) ×1
GLOVE ECLIPSE 7.0 STRL STRAW (GLOVE) ×2 IMPLANT
GLOVE SKINSENSE NS SZ8.0 LF (GLOVE) ×1
GLOVE SKINSENSE STRL SZ8.0 LF (GLOVE) ×2 IMPLANT
GLOVE SS N UNI LF 8.5 STRL (GLOVE) ×3 IMPLANT
GOWN STRL REIN XL XLG (GOWN DISPOSABLE) ×6 IMPLANT
HLDR LEG FOAM (MISCELLANEOUS) ×2 IMPLANT
IV NS IRRIG 3000ML ARTHROMATIC (IV SOLUTION) ×12 IMPLANT
KIT BLADEGUARD II DBL (SET/KITS/TRAYS/PACK) ×3 IMPLANT
KIT ROOM TURNOVER AP CYSTO (KITS) ×3 IMPLANT
LEG HOLDER FOAM (MISCELLANEOUS) ×1
MANIFOLD NEPTUNE II (INSTRUMENTS) ×3 IMPLANT
MARKER SKIN DUAL TIP RULER LAB (MISCELLANEOUS) ×3 IMPLANT
NDL HYPO 18GX1.5 BLUNT FILL (NEEDLE) ×1 IMPLANT
NDL HYPO 21X1.5 SAFETY (NEEDLE) ×1 IMPLANT
NDL SPNL 18GX3.5 QUINCKE PK (NEEDLE) ×1 IMPLANT
NEEDLE HYPO 18GX1.5 BLUNT FILL (NEEDLE) ×3 IMPLANT
NEEDLE HYPO 21X1.5 SAFETY (NEEDLE) ×3 IMPLANT
NEEDLE SPNL 18GX3.5 QUINCKE PK (NEEDLE) ×3 IMPLANT
NS IRRIG 1000ML POUR BTL (IV SOLUTION) ×3 IMPLANT
PACK ARTHRO LIMB DRAPE STRL (MISCELLANEOUS) ×3 IMPLANT
PAD ABD 5X9 TENDERSORB (GAUZE/BANDAGES/DRESSINGS) ×3 IMPLANT
PAD ARMBOARD 7.5X6 YLW CONV (MISCELLANEOUS) ×3 IMPLANT
PAD FLOOR 36X40 (MISCELLANEOUS) ×2 IMPLANT
PADDING CAST COTTON 6X4 STRL (CAST SUPPLIES) ×3 IMPLANT
SET ARTHROSCOPY INST (INSTRUMENTS) ×3 IMPLANT
SET ARTHROSCOPY PUMP TUBE (IRRIGATION / IRRIGATOR) ×3 IMPLANT
SET BASIN LINEN APH (SET/KITS/TRAYS/PACK) ×3 IMPLANT
SPONGE GAUZE 4X4 12PLY (GAUZE/BANDAGES/DRESSINGS) ×3 IMPLANT
STRIP CLOSURE SKIN 1/2X4 (GAUZE/BANDAGES/DRESSINGS) ×1 IMPLANT
SUT ETHILON 3 0 FSL (SUTURE) ×2 IMPLANT
SYR 30ML LL (SYRINGE) ×3 IMPLANT
SYRINGE 10CC LL (SYRINGE) ×3 IMPLANT
WAND 50 DEG COVAC W/CORD (SURGICAL WAND) ×2 IMPLANT
WAND 90 DEG TURBOVAC W/CORD (SURGICAL WAND) IMPLANT
WAND ARTHRO PARAGON T2 (SURGICAL WAND) IMPLANT
YANKAUER SUCT BULB TIP 10FT TU (MISCELLANEOUS) ×9 IMPLANT

## 2011-10-22 NOTE — Brief Op Note (Signed)
10/22/2011  8:36 AM  PATIENT:  [name]  50 y.o. male  PRE-OPERATIVE DIAGNOSIS:  Torn Meniscus Right Knee  POST-OPERATIVE DIAGNOSIS:  Torn Medial Meniscus Right Knee; Degenerative Joint Disease Right Knee  PROCEDURE:  Procedure(s):RIGHT  KNEE ARTHROSCOPY WITH MEDIAL MENISECTOMY  SURGEON:  Surgeon(s): Arther Abbott, MD  PHYSICIAN ASSISTANT:   ASSISTANTS: none   ANESTHESIA:   general  EBL:  Total I/O In: 100 [I.V.:100] Out: 25 [Blood:25]  BLOOD ADMINISTERED:none  DRAINS: none   LOCAL MEDICATIONS USED:  MARCAINE WITH EPI 60 CC  SPECIMEN:  No Specimen  DISPOSITION OF SPECIMEN:  N/A  COUNTS:  YES  TOURNIQUET:  * Missing tourniquet times found for documented tourniquets in log:  5995 *  DICTATION: .Dragon Dictation  PLAN OF CARE: Discharge to home after PACU  PATIENT DISPOSITION:  PACU - hemodynamically stable.   Delay start of Pharmacological VTE agent (>24hrs) due to surgical blood loss or risk of bleeding: NOT APPLICABLE

## 2011-10-22 NOTE — Transfer of Care (Signed)
Immediate Anesthesia Transfer of Care Note  Patient: [name]  Procedure(s) Performed:  KNEE ARTHROSCOPY WITH MEDIAL MENISECTOMY  Patient Location: PACU  Anesthesia Type: General  Level of Consciousness: awake, alert  and oriented  Airway & Oxygen Therapy: Patient Spontanous Breathing and Patient connected to face mask oxygen  Post-op Assessment: Report given to PACU RN  Post vital signs: Reviewed and stable  Complications: No apparent anesthesia complications

## 2011-10-22 NOTE — Anesthesia Preprocedure Evaluation (Signed)
Anesthesia Evaluation  Patient identified by MRN, date of birth, ID band Patient awake    Reviewed: Allergy & Precautions, H&P , NPO status , Patient's Chart, lab work & pertinent test results  Airway Mallampati: I TM Distance: >3 FB Neck ROM: Full    Dental No notable dental hx.    Pulmonary neg pulmonary ROS,    Pulmonary exam normal       Cardiovascular hypertension, Pt. on medications Regular Normal    Neuro/Psych Negative Neurological ROS  Negative Psych ROS   GI/Hepatic GERD-  Medicated,  Endo/Other  Diabetes mellitus-, Type 2, Insulin Dependent  Renal/GU      Musculoskeletal   Abdominal Normal abdominal exam  (+) obese,  Abdomen: soft.    Peds  Hematology   Anesthesia Other Findings   Reproductive/Obstetrics                           Anesthesia Physical Anesthesia Plan  ASA: III  Anesthesia Plan: General   Post-op Pain Management:    Induction: Intravenous, Rapid sequence and Cricoid pressure planned  Airway Management Planned: Oral ETT  Additional Equipment:   Intra-op Plan:   Post-operative Plan: Extubation in OR  Informed Consent: I have reviewed the patients History and Physical, chart, labs and discussed the procedure including the risks, benefits and alternatives for the proposed anesthesia with the patient or authorized representative who has indicated his/her understanding and acceptance.     Plan Discussed with: CRNA  Anesthesia Plan Comments:         Anesthesia Quick Evaluation

## 2011-10-22 NOTE — Interval H&P Note (Signed)
History and Physical Interval Note:   10/22/2011   7:30 AM   [name]  has presented today for surgery, with the diagnosis of Torn meniscus Right knee  The various methods of treatment have been discussed with the patient and family. After consideration of risks, benefits and other options for treatment, the patient has consented to  Procedure(s): ARTHROSCOPY right KNEE as a surgical intervention .  The patients' history has been reviewed, patient examined, no change in status, stable for surgery.  I have reviewed the patients' chart and labs.  Questions were answered to the patient's satisfaction.    H&P guidelines Update  The H/P was reviewed, the patient was re-examined, and there is no change in the patient's condition since the original H/P was completed.  Per Joint commission requirements  Arther Abbott  MD

## 2011-10-22 NOTE — Anesthesia Postprocedure Evaluation (Signed)
  Anesthesia Post-op Note  Patient: [name]  Procedure(s) Performed:  KNEE ARTHROSCOPY WITH MEDIAL MENISECTOMY  Patient Location: PACU  Anesthesia Type: General  Level of Consciousness: awake, alert  and oriented  Airway and Oxygen Therapy: Patient Spontanous Breathing  Post-op Pain: none  Post-op Assessment: Post-op Vital signs reviewed, Patient's Cardiovascular Status Stable, Respiratory Function Stable and No signs of Nausea or vomiting  Post-op Vital Signs: Reviewed and stable  Complications: No apparent anesthesia complications

## 2011-10-22 NOTE — Op Note (Signed)
Procedure note Arthroscopy right knee on 10/22/2011 Preop diagnosis torn medial meniscus right knee Postop diagnosis torn medial meniscus right knee degenerative joint disease Procedure arthroscopy right knee partial medial meniscectomy Indication for procedure pain and mechanical symptoms Surgeon Aline Brochure General anesthesia No assistants Operative findings complex tear posterior horn and midbody medial meniscus with grade 4 changes of the tibial plateau and femoral condyle medially Synovitis of severe  Details of procedure The patient was reexamined in the preop area the site was marked the chart was reviewed and the consent was reviewed and signed. The patient was given a gram of Ancef. The patient was taken to the operating room for general anesthesia. In the supine position the left leg was placed in a well leg holder and the right leg was placed in an arthroscopic leg holder  Medial and lateral portals were established with an 11 blade. The scope was placed through the lateral portal into the medial joint. Diagnostic arthroscopy was completed. A probe was used to palpate intra-articular structures. A shaver was placed in the joint the meniscus was debrided. A straight duckbill forceps was used to resect the torn portions of meniscal tissue. Arthroscopic shaver was used to remove meniscal fragments. 50 ArthroCare wand was used to balance the meniscus. The meniscus was confirmed to be stable with a probe.  The knee was irrigated and the portals were closed with 3-0 nylon suture. 60 cc of Marcaine with epinephrine were injected into the joint.  Sterile bandages and a Cryo/Cuff were placed and activated the patient was extubated and taken to recovery room in stable condition  Plan is for discharge home with a followup on Monday  Copy to Dr. [name].

## 2011-10-22 NOTE — Anesthesia Procedure Notes (Signed)
Procedure Name: Intubation Date/Time: 10/22/2011 7:51 AM Performed by: Tressie Stalker Pre-anesthesia Checklist: Patient identified, Patient being monitored, Timeout performed, Emergency Drugs available and Suction available Patient Re-evaluated:Patient Re-evaluated prior to inductionOxygen Delivery Method: Circle System Utilized Preoxygenation: Pre-oxygenation with 100% oxygen Intubation Type: IV induction, Rapid sequence and Circoid Pressure applied Laryngoscope Size: Mac and 3 Grade View: Grade II Tube type: Oral Tube size: 7.0 mm Number of attempts: 1 Airway Equipment and Method: stylet Placement Confirmation: ETT inserted through vocal cords under direct vision,  positive ETCO2 and breath sounds checked- equal and bilateral Secured at: 21 cm Tube secured with: Tape Dental Injury: Teeth and Oropharynx as per pre-operative assessment

## 2011-10-23 LAB — GLUCOSE, CAPILLARY: Glucose-Capillary: 222 mg/dL — ABNORMAL HIGH (ref 70–99)

## 2011-10-25 ENCOUNTER — Ambulatory Visit: Payer: Federal, State, Local not specified - PPO | Admitting: Orthopedic Surgery

## 2011-10-25 ENCOUNTER — Telehealth: Payer: Self-pay | Admitting: Orthopedic Surgery

## 2011-10-25 NOTE — Telephone Encounter (Signed)
Moved patient's post op appointment onto tomorrow, patient aware.

## 2011-10-25 NOTE — Telephone Encounter (Signed)
Call received back from patient after calling him to re-schedule post op #1 appointment from today to Wednesday, due to Dr Aline Brochure in surgery.  States has slight fever, otherwise doing fine.  Relates also, hx of rheumatic fever as a child and said therefore usually takes an anti-biotic, either prior to, or following any procedure.  Pharmacy is [facility] in [place], [place].  Patient [phone] (Home)

## 2011-10-26 ENCOUNTER — Ambulatory Visit (INDEPENDENT_AMBULATORY_CARE_PROVIDER_SITE_OTHER): Payer: Federal, State, Local not specified - PPO | Admitting: Orthopedic Surgery

## 2011-10-26 ENCOUNTER — Encounter: Payer: Self-pay | Admitting: Orthopedic Surgery

## 2011-10-26 VITALS — BP 140/90 | Ht 68.5 in | Wt 285.0 lb

## 2011-10-26 DIAGNOSIS — IMO0002 Reserved for concepts with insufficient information to code with codable children: Secondary | ICD-10-CM

## 2011-10-26 DIAGNOSIS — Z9889 Other specified postprocedural states: Secondary | ICD-10-CM

## 2011-10-26 DIAGNOSIS — M1710 Unilateral primary osteoarthritis, unspecified knee: Secondary | ICD-10-CM

## 2011-10-26 DIAGNOSIS — M179 Osteoarthritis of knee, unspecified: Secondary | ICD-10-CM

## 2011-10-26 DIAGNOSIS — M23329 Other meniscus derangements, posterior horn of medial meniscus, unspecified knee: Secondary | ICD-10-CM

## 2011-10-26 NOTE — Progress Notes (Signed)
Arthroscopy right knee on 10/22/2011  Preop diagnosis torn medial meniscus right knee  Postop diagnosis torn medial meniscus right knee degenerative joint disease  Procedure arthroscopy right knee partial medial meniscectomy  Indication for procedure pain and mechanical symptoms  Surgeon Aline Brochure  General anesthesia  No assistants  Operative findings complex tear posterior horn and midbody medial meniscus with grade 4 changes of the tibial plateau and femoral condyle medially  Synovitis of severe  DOING WELL, WALKS WITHOUT SUPPORT, KNEE FLEXION 100 DEGREES   PORTAL SUTURES WERE REMOVED

## 2011-10-26 NOTE — Patient Instructions (Signed)
START PT

## 2011-10-27 ENCOUNTER — Ambulatory Visit: Payer: Federal, State, Local not specified - PPO | Admitting: Orthopedic Surgery

## 2011-10-29 ENCOUNTER — Ambulatory Visit (HOSPITAL_COMMUNITY)
Admission: RE | Admit: 2011-10-29 | Discharge: 2011-10-29 | Disposition: A | Discharge status: Home / Self Care | Payer: Federal, State, Local not specified - PPO | Source: Ambulatory Visit | Attending: Orthopedic Surgery | Admitting: Orthopedic Surgery

## 2011-10-29 DIAGNOSIS — E119 Type 2 diabetes mellitus without complications: Secondary | ICD-10-CM | POA: Insufficient documentation

## 2011-10-29 DIAGNOSIS — I10 Essential (primary) hypertension: Secondary | ICD-10-CM | POA: Insufficient documentation

## 2011-10-29 DIAGNOSIS — M6281 Muscle weakness (generalized): Secondary | ICD-10-CM | POA: Insufficient documentation

## 2011-10-29 DIAGNOSIS — R262 Difficulty in walking, not elsewhere classified: Secondary | ICD-10-CM | POA: Insufficient documentation

## 2011-10-29 DIAGNOSIS — IMO0001 Reserved for inherently not codable concepts without codable children: Secondary | ICD-10-CM | POA: Insufficient documentation

## 2011-10-29 DIAGNOSIS — M25669 Stiffness of unspecified knee, not elsewhere classified: Secondary | ICD-10-CM | POA: Insufficient documentation

## 2011-10-29 DIAGNOSIS — M25569 Pain in unspecified knee: Secondary | ICD-10-CM | POA: Insufficient documentation

## 2011-10-29 NOTE — Progress Notes (Signed)
Physical Therapy Evaluation  Patient Details  Name: [name] MRN: [id number] Date of Birth: 1961-10-30  Today's Date: 10/29/2011 Time: [id number]  Charges: 1 eval Visit#: 1 of 6 Re-eval: 11/16/11    Past Medical History:  Past Medical History  Diagnosis Date  . HTN (hypertension)   . Diabetes mellitus   . High cholesterol   . Arthritis    Past Surgical History:  Past Surgical History  Procedure Date  . Appendectomy   . Right biceps tendon   . Right hand nerve damage   . Left knee arthroscopy   . Knee arthroscopy left    Subjective Symptoms/Limitations Symptoms: Pt is a 50 year old male referred to PT s/p R knee scope.  He previously had a L knee scope in 2005 and recovered well from the surgery.  C/co is pain and getting up, especially from a chair especially to both sides of his knee.   He reports that he continues to improve everyday.  How long can you sit comfortably?: No difficulty  How long can you stand comfortably?: no difficulty ( just has difficulty going from sit to stand) How long can you walk comfortably?: 30 minutes  Pain Assessment Pain Score:   4 Pain Location: Knee Pain Orientation: Right   10/29/11 1400  Assessment  Diagnosis R knee scope  Surgical Date 10/22/11  Next MD Visit 11/16/11  Prior Therapy None for this condition.   Prior Function  Level of Independence Independent with basic ADLs;Independent with homemaking with ambulation  Able to Take Stairs? 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 [employer] (Comment)  [employer], golfing, walking for exercise (hiking)  RLE AROM (degrees)  Right Knee Extension 0-130 -2   Right Knee Flexion 0-140 112   RLE PROM (degrees)  Right Knee Extension 0-130 0   Right Knee Flexion 0-140 117   RLE Strength  Right Hip Flexion 4/5  Right Hip Extension 3+/5  Right Hip ABduction 5/5  Right Hip ADduction 5/5  Right Knee  Flexion 4/5  Right Knee Extension 4/5     Exercise/Treatments  10/29/11 1402  Knee Exercises: Stretches  Active Hamstring Stretch 30 seconds  Knee Exercises: Supine  Short Arc Quad Sets Right;10 reps  Bridges 10 reps  Straight Leg Raises 10 reps  Patellar Mobs Education to complete on his own  Knee Exercises: Prone  Hamstring Curl 10 reps  Hip Extension Right;10 reps     Physical Therapy Assessment and Plan      Goals    Problem List Patient Active Problem List  Diagnoses  . Rotator cuff syndrome of left shoulder  . Right knee meniscal tear  . Medial meniscus, posterior horn derangement  . Lateral meniscus derangement  . OA (osteoarthritis) of knee  . S/P right knee arthroscopy        Umar Patmon 10/29/2011, 6:45 PM  Physician Documentation Your signature is required to indicate approval of the treatment plan as stated above.  Please sign and either send electronically or make a copy of this report for your files and return this physician signed original.   Please mark one 1.__approve of plan  2. ___approve of plan with the following conditions.   ______________________________  _____________________ Physician Signature                                                                                                             Date

## 2011-11-03 ENCOUNTER — Ambulatory Visit (HOSPITAL_COMMUNITY)
Admission: RE | Admit: 2011-11-03 | Discharge: 2011-11-03 | Disposition: A | Discharge status: Home / Self Care | Payer: Federal, State, Local not specified - PPO | Source: Ambulatory Visit | Attending: Orthopedic Surgery | Admitting: Orthopedic Surgery

## 2011-11-03 NOTE — Progress Notes (Signed)
Physical Therapy Treatment Patient Details  Name: [name] MRN: [id number] Date of Birth: [birth year]  Today's Date: 11/03/2011 Time: [id number] Time Calculation (min): 42 min Visit#: 2  of 6   Re-eval: 11/16/11 Assessment Diagnosis: R knee scope Charges:  therex 30', icepack 10'  Subjective: Symptoms/Limitations Symptoms: Pt. reports only a little pain in the knee.  States he has more soreness but no real pain. Pain Assessment Currently in Pain?: No/denies   Exercise/Treatments Stretches Active Hamstring Stretch: 3 reps;30 seconds Gastroc Stretch: 3 reps;20 seconds Aerobic Stationary Bike: 6'@2 .5 Standing Heel Raises: 15 reps Knee Flexion: 15 reps Functional Squat: 10 reps Seated Long Arc Quad: 15 reps Supine Bridges: 15 reps Straight Leg Raises: 10 reps;Limitations Straight Leg Raises Limitations: 3# Prone  Hamstring Curl: 10 reps;Limitations Hamstring Curl Limitations: 3# Hip Extension: 10 reps;Limitations Hip Extension Limitations: 3#   Modalities Modalities: Cryotherapy Cryotherapy Number Minutes Cryotherapy: 10 Minutes Cryotherapy Location: Knee Type of Cryotherapy: Ice pack  Physical Therapy Assessment and Plan PT Assessment and Plan Clinical Impression Statement: Added standing exercises and added weight to mat exercises without difficulty or pain.  Overall progressing well. PT Treatment/Interventions: Therapeutic exercise (icepack) PT Plan: Add stool scoots, forward/lateral step ups and forward step downs next visit.    Problem List Patient Active Problem List  Diagnoses  . Rotator cuff syndrome of left shoulder  . Right knee meniscal tear  . Medial meniscus, posterior horn derangement  . Lateral meniscus derangement  . OA (osteoarthritis) of knee  . S/P right knee arthroscopy  . Knee pain  . Knee stiffness    PT - End of Session Activity Tolerance: Patient tolerated treatment well General Behavior During Session: [facility] for  tasks performed Cognition: [facility] for tasks performed  Roseanne Reno B 11/03/2011, 4:00 PM

## 2011-11-09 ENCOUNTER — Ambulatory Visit (HOSPITAL_COMMUNITY)
Admission: RE | Admit: 2011-11-09 | Discharge: 2011-11-09 | Disposition: A | Discharge status: Home / Self Care | Payer: Federal, State, Local not specified - PPO | Source: Ambulatory Visit | Attending: Family Medicine | Admitting: Family Medicine

## 2011-11-09 NOTE — Progress Notes (Addendum)
Physical Therapy Treatment Patient Details  Name: [name] MRN: [id number] Date of Birth: Oct 11, 1961  Today's Date: 11/09/2011 Time: [id number] Time Calculation (min): 46 min Visit#: 3  of 6   Re-eval: 11/16/11 Charges:  therex 30', icepack 10'    Subjective: Symptoms/Limitations Symptoms: Pt. states only a little discomfort today with continued soreness.  Exercise/Treatments Stretches Gastroc Stretch: 3 reps;20 seconds Aerobic Stationary Bike: 6'@2 .5 seat 10 Standing Heel Raises: 20 reps Knee Flexion: 20 reps Functional Squat: 15 reps Supine Short Arc Quad Sets: 15 reps;Limitations Short Arc Quad Sets Limitations: 3# Bridges: 15 reps Straight Leg Raises: 15 reps Straight Leg Raises Limitations: 3# Prone  Hamstring Curl: 15 reps Hamstring Curl Limitations: 3# Hip Extension: 15 reps Hip Extension Limitations: 3#   Modalities Modalities: Cryotherapy Cryotherapy Number Minutes Cryotherapy: 10 Minutes Cryotherapy Location: Knee Type of Cryotherapy: Ice pack  Physical Therapy Assessment and Plan PT Assessment and Plan Clinical Impression Statement: Progressing well with therex and functional activities.  Ready for progression PT Plan: Add step ups forward/lateral and step downs with 4" step next visit.   Progress to stairwell next week. Re-evaluate next week.  Pt returns to MD 11/16/11.   Problem List Patient Active Problem List  Diagnoses  . Rotator cuff syndrome of left shoulder  . Right knee meniscal tear  . Medial meniscus, posterior horn derangement  . Lateral meniscus derangement  . OA (osteoarthritis) of knee  . S/P right knee arthroscopy  . Knee pain  . Knee stiffness    PT - End of Session Activity Tolerance: Patient tolerated treatment well General Behavior During Session: [facility] for tasks performed Cognition: [facility] for tasks performed  Roseanne Reno B 11/09/2011, 8:42 AM

## 2011-11-12 ENCOUNTER — Ambulatory Visit (HOSPITAL_COMMUNITY)
Admission: RE | Admit: 2011-11-12 | Discharge: 2011-11-12 | Disposition: A | Discharge status: Home / Self Care | Payer: Federal, State, Local not specified - PPO | Source: Ambulatory Visit | Attending: Family Medicine | Admitting: Family Medicine

## 2011-11-12 NOTE — Progress Notes (Signed)
Physical Therapy Evaluation  Patient Details  Name: [name] MRN: [id number] Date of Birth: 10-Oct-1961  Today's Date: 11/12/2011 Time: [id number] Time Calculation (min): 44 min Visit#: 4  of 6   Re-eval:  today    Past Medical History:  Past Medical History  Diagnosis Date  . HTN (hypertension)   . Diabetes mellitus   . High cholesterol   . Arthritis    Past Surgical History:  Past Surgical History  Procedure Date  . Appendectomy   . Right biceps tendon   . Right hand nerve damage   . Left knee arthroscopy   . Knee arthroscopy left    Subjective Symptoms/Limitations Symptoms: Pt states he's doing good today.  He ran out of pain meds yesterday and had a rough night last night.  He is still taking pain meds about twice a day. How long can you sit comfortably?: no difficulthy How long can you stand comfortably?: Pt states that going sit to stand is no problem now.  On inital eval patient states he was having difficulty coming from sit to stand. How long can you walk comfortably?: The patient states that he has walked an hour.  He has not had the need to walk any further than this distance. Pain Assessment Currently in Pain?: Yes Pain Score:   2 (last night without pain meds pain was up to an 8) Pain Location: Knee Pain Orientation: Right;Medial Pain Type: Surgical pain;Chronic pain   Objective: RLE AROM (degrees) Right Knee Extension 0-130: 1 was 2 degrees Right Knee Flexion 0-140: 120 was 112 degrees RLE Strength Right Hip Flexion: 5/5 was 4/5 Right Hip Extension: 5/5 was 3+/5 Right Hip ABduction: 5/5 was 5/5 Right Hip ADduction: 5/5 was 5/5 Right Knee Flexion: 4/5 was 4/5 Right Knee Extension: 4/5 was 4/5  Exercise/Treatments   Aerobic Stationary Bike: 7'@3 .0    Standing Knee Flexion: Strengthening;Right;15 reps;Limitations Knee Flexion Limitations: 5# Wall Squat: 10 reps Seated Long Arc Quad: Strengthening;15 reps;Weights Long Arc Quad  Weight: 8 lbs. Supine Short Arc Quad Sets: 15 reps;Limitations Short Arc Quad Sets Limitations: 8# Terminal Knee Extension: 10 reps;Limitations Terminal Knee Extension Limitations: 3#   Prone  Hamstring Curl: 15 reps;Limitations Hamstring Curl Limitations: 8#    Physical Therapy Assessment and Plan PT Assessment and Plan Clinical Impression Statement: Pt improved in ROM and hip strength; knee strength has not changed continue last two treatments concentrating on knee mm strength Clinical Impairments Affecting Rehab Potential: knee mm strength PT Plan: Discharge in two treatments to HEP    Goals Home Exercise Program PT Goal: Perform Home Exercise Program - Progress: Met PT Short Term Goals PT Short Term Goal 1 - Progress: Met PT Short Term Goal 2 - Progress: Met PT Short Term Goal 3 - Progress: Partly met PT Short Term Goal 4: able to go up and down steps reciprocally but knee strength remains 4/5  Problem List Patient Active Problem List  Diagnoses  . Rotator cuff syndrome of left shoulder  . Right knee meniscal tear  . Medial meniscus, posterior horn derangement  . Lateral meniscus derangement  . OA (osteoarthritis) of knee  . S/P right knee arthroscopy  . Knee pain  . Knee stiffness    PT - End of Session Activity Tolerance: Patient tolerated treatment well General Behavior During Session: [facility] for tasks performed Cognition: [facility] for tasks performed   Jacqlyn Marolf,CINDY 11/12/2011, 8:52 AM  Physician Documentation Your signature is required to indicate approval of the treatment plan as  stated above.  Please sign and either send electronically or make a copy of this report for your files and return this physician signed original.   Please mark one 1.__approve of plan  2. ___approve of plan with the following conditions.   ______________________________                                                          _____________________ Physician Signature                                                                                                              Date

## 2011-11-16 ENCOUNTER — Encounter: Payer: Self-pay | Admitting: Orthopedic Surgery

## 2011-11-16 ENCOUNTER — Ambulatory Visit (INDEPENDENT_AMBULATORY_CARE_PROVIDER_SITE_OTHER): Payer: Federal, State, Local not specified - PPO | Admitting: Orthopedic Surgery

## 2011-11-16 ENCOUNTER — Ambulatory Visit (HOSPITAL_COMMUNITY)
Admission: RE | Admit: 2011-11-16 | Discharge: 2011-11-16 | Disposition: A | Discharge status: Home / Self Care | Payer: Federal, State, Local not specified - PPO | Source: Ambulatory Visit | Attending: Family Medicine | Admitting: Family Medicine

## 2011-11-16 DIAGNOSIS — M23302 Other meniscus derangements, unspecified lateral meniscus, unspecified knee: Secondary | ICD-10-CM

## 2011-11-16 DIAGNOSIS — IMO0002 Reserved for concepts with insufficient information to code with codable children: Secondary | ICD-10-CM

## 2011-11-16 DIAGNOSIS — M6281 Muscle weakness (generalized): Secondary | ICD-10-CM | POA: Insufficient documentation

## 2011-11-16 DIAGNOSIS — M23329 Other meniscus derangements, posterior horn of medial meniscus, unspecified knee: Secondary | ICD-10-CM

## 2011-11-16 DIAGNOSIS — M179 Osteoarthritis of knee, unspecified: Secondary | ICD-10-CM

## 2011-11-16 DIAGNOSIS — M25569 Pain in unspecified knee: Secondary | ICD-10-CM | POA: Insufficient documentation

## 2011-11-16 DIAGNOSIS — E119 Type 2 diabetes mellitus without complications: Secondary | ICD-10-CM | POA: Insufficient documentation

## 2011-11-16 DIAGNOSIS — I10 Essential (primary) hypertension: Secondary | ICD-10-CM | POA: Insufficient documentation

## 2011-11-16 DIAGNOSIS — Z9889 Other specified postprocedural states: Secondary | ICD-10-CM

## 2011-11-16 DIAGNOSIS — R262 Difficulty in walking, not elsewhere classified: Secondary | ICD-10-CM | POA: Insufficient documentation

## 2011-11-16 DIAGNOSIS — M1710 Unilateral primary osteoarthritis, unspecified knee: Secondary | ICD-10-CM

## 2011-11-16 DIAGNOSIS — IMO0001 Reserved for inherently not codable concepts without codable children: Secondary | ICD-10-CM | POA: Insufficient documentation

## 2011-11-16 NOTE — Patient Instructions (Signed)
Finish PT

## 2011-11-16 NOTE — Progress Notes (Signed)
Arthroscopy right knee on 10/22/2011  Preop diagnosis torn medial meniscus right knee  Postop diagnosis torn medial meniscus right knee degenerative joint disease  Procedure arthroscopy right knee partial medial meniscectomy  Indication for procedure pain and mechanical symptoms  Surgeon Aline Brochure  General anesthesia  No assistants  Operative findings complex tear posterior horn and midbody medial meniscus with grade 4 changes of the tibial plateau and femoral condyle medially  Synovitis of severe  Doing well   ROM back to pre-op level  No joint effusion no tenderness

## 2011-11-16 NOTE — Progress Notes (Signed)
Physical Therapy Treatment Patient Details  Name: [name] MRN: [id number] Date of Birth: 1961-08-04  Today's Date: 11/16/2011 Time: 1602-1700 Time Calculation (min): 58 min Visit#: 5  of 6   Re-eval: 11/19/11 Charges: Therex x 40'  Subjective: Symptoms/Limitations Symptoms: It's just a little sore and stiff. Pain Assessment Currently in Pain?: Yes Pain Score:   1 (1-2/10) Pain Location: Knee Pain Orientation: Right;Medial   Exercise/Treatments Aerobic Stationary Bike: 7'@3 .0 Standing Knee Flexion: 20 reps Knee Flexion Limitations: 5# Wall Squat: 15 reps;5 seconds Seated Long Arc Quad: 20 reps Long Arc Quad Weight: 8 lbs. Supine Short Arc Quad Sets: 20 reps Short Arc Quad Sets Limitations: 8# Bridges: 15 reps;Strengthening;20 reps Straight Leg Raises: 15 reps Straight Leg Raises Limitations: 3# Prone  Hamstring Curl: 20 reps Hamstring Curl Limitations: 8#   Cryotherapy Number Minutes Cryotherapy: 10 Minutes Cryotherapy Location: Knee (Right) Type of Cryotherapy: Ice pack  Physical Therapy Assessment and Plan PT Assessment and Plan Clinical Impression Statement: Pt completes exercises with good control. Pt requires minimal vc's for technique. Pt w/o c/o increased pain throughout tx. Ice added at end of session to decrease swelling/pain. PT Treatment/Interventions: Therapeutic activities;Other (comment) (Ice) PT Plan: Continue x 1 more visit then D/C to HEP per PT.     Problem List Patient Active Problem List  Diagnoses  . Rotator cuff syndrome of left shoulder  . Right knee meniscal tear  . Medial meniscus, posterior horn derangement  . Lateral meniscus derangement  . OA (osteoarthritis) of knee  . S/P right knee arthroscopy  . Knee pain  . Knee stiffness    PT - End of Session Activity Tolerance: Patient tolerated treatment well General Behavior During Session: [facility] for tasks performed Cognition: [facility] for tasks performed  Jonah Blue 11/16/2011, 5:45 PM

## 2011-11-19 ENCOUNTER — Ambulatory Visit (HOSPITAL_COMMUNITY)
Admission: RE | Admit: 2011-11-19 | Discharge: 2011-11-19 | Disposition: A | Discharge status: Home / Self Care | Payer: Federal, State, Local not specified - PPO | Source: Ambulatory Visit | Attending: Family Medicine | Admitting: Family Medicine

## 2011-11-19 ENCOUNTER — Ambulatory Visit (HOSPITAL_COMMUNITY): Payer: Federal, State, Local not specified - PPO | Admitting: *Deleted

## 2011-11-19 NOTE — Progress Notes (Signed)
Physical Therapy Treatment Patient Details  Name: MOHAMADALI BRANTON MRN: [id number] Date of Birth: 06/07/1961  Today's Date: 11/19/2011 Time: [id number] Time Calculation (min): 44 min Visit#: 6  of 6   Re-eval:      Subjective: Symptoms/Limitations Symptoms: Pt reports that he is going back to work on Monday morning.  He continues to do his exercises at home.   Objective:  MMT: 5/5 to knee flexion and extension  Exercise/Treatments Stretches Active Hamstring Stretch: 2 reps;30 seconds Quad Stretch: 2 reps;30 seconds Gastroc Stretch: 2 reps;30 seconds Soleus Stretch: 2 reps;30 seconds Aerobic Stationary Bike: 6' 4.0 Machines for Strengthening Total Gym Leg Press: 15 degrees RLE only 20x; Calf Press RLE only 15x Standing Forward Lunges: Both;5 reps;Limitations Forward Lunges Limitations: Half kneel to stand Wall Squat: 10 reps;5 seconds Stairs: 2 RT reciprocally Seated Stool Scoot - Round Trips: 3 RT Demonstrated sitting on ground to half kneel to stand x5  Physical Therapy Assessment and Plan PT Assessment and Plan Clinical Impression Statement: Mr. Tisby has attended 6 OPPT visits.  He has met all of his goals at this time.  In that time he has gained functional strength and ROM which will prepare him to return to work.   PT Plan: D/C with advanced HEP.     Goals  All goals met  Problem List Patient Active Problem List  Diagnoses  . Rotator cuff syndrome of left shoulder  . Right knee meniscal tear  . Medial meniscus, posterior horn derangement  . Lateral meniscus derangement  . OA (osteoarthritis) of knee  . S/P right knee arthroscopy  . Knee pain  . Knee stiffness       Carmel Garfield 11/19/2011, 4:03 PM

## 2012-01-09 ENCOUNTER — Other Ambulatory Visit: Payer: Self-pay | Admitting: Orthopedic Surgery

## 2012-01-09 DIAGNOSIS — M179 Osteoarthritis of knee, unspecified: Secondary | ICD-10-CM

## 2012-01-09 DIAGNOSIS — M1710 Unilateral primary osteoarthritis, unspecified knee: Secondary | ICD-10-CM

## 2012-01-10 NOTE — Telephone Encounter (Signed)
APPROVED

## 2012-02-28 ENCOUNTER — Other Ambulatory Visit: Payer: Self-pay | Admitting: *Deleted

## 2012-02-28 DIAGNOSIS — M179 Osteoarthritis of knee, unspecified: Secondary | ICD-10-CM

## 2012-02-28 DIAGNOSIS — M1710 Unilateral primary osteoarthritis, unspecified knee: Secondary | ICD-10-CM

## 2012-02-28 MED ORDER — TRAMADOL-ACETAMINOPHEN 37.5-325 MG PO TABS: 1.0000 | ORAL_TABLET | ORAL | Status: DC | PRN

## 2012-05-18 ENCOUNTER — Ambulatory Visit: Payer: Federal, State, Local not specified - PPO | Admitting: Orthopedic Surgery

## 2012-05-24 ENCOUNTER — Ambulatory Visit (INDEPENDENT_AMBULATORY_CARE_PROVIDER_SITE_OTHER): Payer: Federal, State, Local not specified - PPO

## 2012-05-24 ENCOUNTER — Ambulatory Visit (INDEPENDENT_AMBULATORY_CARE_PROVIDER_SITE_OTHER): Payer: Federal, State, Local not specified - PPO | Admitting: Orthopedic Surgery

## 2012-05-24 ENCOUNTER — Encounter: Payer: Self-pay | Admitting: Orthopedic Surgery

## 2012-05-24 VITALS — Ht 68.5 in | Wt 275.0 lb

## 2012-05-24 DIAGNOSIS — M25569 Pain in unspecified knee: Secondary | ICD-10-CM

## 2012-05-24 DIAGNOSIS — M1710 Unilateral primary osteoarthritis, unspecified knee: Secondary | ICD-10-CM

## 2012-05-24 DIAGNOSIS — M179 Osteoarthritis of knee, unspecified: Secondary | ICD-10-CM

## 2012-05-24 DIAGNOSIS — IMO0002 Reserved for concepts with insufficient information to code with codable children: Secondary | ICD-10-CM

## 2012-05-24 NOTE — Progress Notes (Signed)
Patient ID: [name], male   DOB: [birth year], 51 y.o.   MRN: [id number] Chief Complaint  Patient presents with  . Follow-up    6 month recheck and xray rt knee, DOS 10/22/11    History status post arthroscopy right knee for arthritis and meniscal pathology.  Doing well at this time with occasional pain  Review of systems negative for catching locking or giving way  Examination today he reveals a healthy well-developed well-nourished male slightly to moderately overweight blood and affect are normal he is oriented x3 walks without support he has a varus deformity to his right knee with mild medial joint line tenderness no swelling normal range of motion. All ligaments are stable. Strength is normal scans intact neurovascular exam is normal  X-rays show varus osteoarthritis of the knee with sclerotic bone and osteophytes as well as cyst formation  Minimal progression from his last x-ray  Stable osteoarthritis right knee status post arthroscopy  When the patient becomes more symptomatic with constant pain or severe unrelenting pain in the knee replacement is in order  He is encouraged to keep his weight down and exercise as tolerated per

## 2012-05-24 NOTE — Patient Instructions (Addendum)
activities as tolerated 

## 2012-08-07 ENCOUNTER — Other Ambulatory Visit: Payer: Self-pay | Admitting: Orthopedic Surgery

## 2013-01-17 ENCOUNTER — Other Ambulatory Visit: Payer: Self-pay | Admitting: *Deleted

## 2013-01-17 DIAGNOSIS — M25569 Pain in unspecified knee: Secondary | ICD-10-CM

## 2013-01-17 MED ORDER — TRAMADOL-ACETAMINOPHEN 37.5-325 MG PO TABS: 1.0000 | ORAL_TABLET | ORAL | Status: DC | PRN

## 2013-05-03 ENCOUNTER — Ambulatory Visit (INDEPENDENT_AMBULATORY_CARE_PROVIDER_SITE_OTHER): Payer: Federal, State, Local not specified - PPO

## 2013-05-03 ENCOUNTER — Ambulatory Visit (INDEPENDENT_AMBULATORY_CARE_PROVIDER_SITE_OTHER): Payer: Federal, State, Local not specified - PPO | Admitting: Orthopedic Surgery

## 2013-05-03 ENCOUNTER — Encounter: Payer: Self-pay | Admitting: Orthopedic Surgery

## 2013-05-03 VITALS — BP 136/90 | Ht 68.5 in | Wt 253.0 lb

## 2013-05-03 DIAGNOSIS — M179 Osteoarthritis of knee, unspecified: Secondary | ICD-10-CM

## 2013-05-03 DIAGNOSIS — M25569 Pain in unspecified knee: Secondary | ICD-10-CM

## 2013-05-03 DIAGNOSIS — M25561 Pain in right knee: Secondary | ICD-10-CM

## 2013-05-03 DIAGNOSIS — M25562 Pain in left knee: Secondary | ICD-10-CM

## 2013-05-03 DIAGNOSIS — M1710 Unilateral primary osteoarthritis, unspecified knee: Secondary | ICD-10-CM

## 2013-05-03 DIAGNOSIS — IMO0002 Reserved for concepts with insufficient information to code with codable children: Secondary | ICD-10-CM

## 2013-05-03 NOTE — Patient Instructions (Addendum)
Continue tramadol/apap  You have received a steroid shot. 15% of patients experience increased pain at the injection site with in the next 24 hours. This is best treated with ice and tylenol extra strength 2 tabs every 8 hours. If you are still having pain please call the office.

## 2013-05-03 NOTE — Progress Notes (Signed)
Patient ID: [name], male   DOB: 12-Apr-1961, 52 y.o.   MRN: [id number] Chief Complaint  Patient presents with  . Knee Pain    bilateral knee pain.     History  This is a 52 year old male status post right knee arthroscopy presents with bilateral knee pain worsening in the last 4 months. He has sharp dull throbbing intermittent pain which becomes a 10 after walking or kneeling which is required for his job. He reports a catching nothing really makes it better he says everything makes it worse his knee pain is diffuse.  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 Body habitus and do more for ambulation unsupported and without a limp  Knee examination inspection reveals varus deformity, there is a medial joint line tenderness no effusion. Range of motion 120 stability confirmed with anterior posterior drawer and valgus varus stress. Motor exam normal skin intact distal pulses normal sensation  X-rays both knees bilateral varus osteoarthritis  Diagnosis osteoarthritis  Recommend injections continue Ultracet for pain continue supplements  Patient will need knee replacement surgery. We discussed that today. It is unlikely he can crawl stand for long periods or climb ladders after the surgery. Sequential procedures one in 3 months later to the other side

## 2013-05-10 ENCOUNTER — Ambulatory Visit: Payer: Federal, State, Local not specified - PPO | Admitting: Orthopedic Surgery

## 2013-05-19 ENCOUNTER — Emergency Department (HOSPITAL_COMMUNITY)
Admission: EM | Admit: 2013-05-19 | Discharge: 2013-05-19 | Disposition: A | Discharge status: Home / Self Care | Payer: Federal, State, Local not specified - PPO | Attending: Emergency Medicine | Admitting: Emergency Medicine

## 2013-05-19 ENCOUNTER — Encounter (HOSPITAL_COMMUNITY): Payer: Self-pay | Admitting: *Deleted

## 2013-05-19 DIAGNOSIS — Y93E1 Activity, personal bathing and showering: Secondary | ICD-10-CM | POA: Insufficient documentation

## 2013-05-19 DIAGNOSIS — Z7982 Long term (current) use of aspirin: Secondary | ICD-10-CM | POA: Insufficient documentation

## 2013-05-19 DIAGNOSIS — S30860A Insect bite (nonvenomous) of lower back and pelvis, initial encounter: Secondary | ICD-10-CM

## 2013-05-19 DIAGNOSIS — M129 Arthropathy, unspecified: Secondary | ICD-10-CM | POA: Insufficient documentation

## 2013-05-19 DIAGNOSIS — Y9289 Other specified places as the place of occurrence of the external cause: Secondary | ICD-10-CM | POA: Insufficient documentation

## 2013-05-19 DIAGNOSIS — Z794 Long term (current) use of insulin: Secondary | ICD-10-CM | POA: Insufficient documentation

## 2013-05-19 DIAGNOSIS — R5381 Other malaise: Secondary | ICD-10-CM | POA: Insufficient documentation

## 2013-05-19 DIAGNOSIS — I10 Essential (primary) hypertension: Secondary | ICD-10-CM | POA: Insufficient documentation

## 2013-05-19 DIAGNOSIS — R5383 Other fatigue: Secondary | ICD-10-CM | POA: Insufficient documentation

## 2013-05-19 DIAGNOSIS — W57XXXA Bitten or stung by nonvenomous insect and other nonvenomous arthropods, initial encounter: Secondary | ICD-10-CM | POA: Insufficient documentation

## 2013-05-19 DIAGNOSIS — E119 Type 2 diabetes mellitus without complications: Secondary | ICD-10-CM | POA: Insufficient documentation

## 2013-05-19 DIAGNOSIS — E7800 Pure hypercholesterolemia, unspecified: Secondary | ICD-10-CM | POA: Insufficient documentation

## 2013-05-19 DIAGNOSIS — Z79899 Other long term (current) drug therapy: Secondary | ICD-10-CM | POA: Insufficient documentation

## 2013-05-19 MED ORDER — DOXYCYCLINE HYCLATE 100 MG PO CAPS: 100.0000 mg | ORAL_CAPSULE | Freq: Two times a day (BID) | ORAL | Status: AC

## 2013-05-19 MED ORDER — DOXYCYCLINE HYCLATE 100 MG PO TABS
100.0000 mg | ORAL_TABLET | Freq: Once | ORAL | Status: AC
  Administered 2013-05-19: 100 mg via ORAL
  Filled 2013-05-19: qty 1

## 2013-05-19 NOTE — ED Provider Notes (Signed)
Medical screening examination/treatment/procedure(s) were performed by non-physician practitioner and as supervising physician I was immediately available for consultation/collaboration.   Maudry Diego, MD 05/19/13 [id number]
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
## 2013-05-19 NOTE — ED Provider Notes (Signed)
History     CSN: [id number]  Arrival date & time 05/19/13  1858   First MD Initiated Contact with Patient 05/19/13 1935      Chief Complaint  Patient presents with  . Insect Bite    (Consider location/radiation/quality/duration/timing/severity/associated sxs/prior treatment) HPI Comments: Patient is a 52 year old male who has a history of hypertension, diabetes, and arthritis. He presents to the emergency department with complaint of a tick found on the buttocks. The patient states that today when he was taking a bath he felt something unusual at the top of the buttocks, his wife check that for him and found that it was a tick. They attempted to remove the tick but he was too much embedded and they present to the emergency department for additional evaluation. It is also of note that approximately 2 weeks ago the patient had a tick removed from the right lower leg. The patient's significant other states that the patient has been more tired in the last week than usual, and she is concerned that he may be developing [facility] spotted fever. She denies seeing any unusual rash. There's been no measured high fever. The patient also has not had any nausea, vomiting or muscle ache. There has been some joint pain, but the patient has a history of arthritis and takes Ultram for his joints.  The history is provided by the patient and the spouse.    Past Medical History  Diagnosis Date  . HTN (hypertension)   . Diabetes mellitus   . High cholesterol   . Arthritis     Past Surgical History  Procedure Laterality Date  . Appendectomy    . Right biceps tendon    . Right hand nerve damage    . Left knee arthroscopy    . Knee arthroscopy  left    Family History  Problem Relation Age of Onset  . Arthritis    . Cancer    . Diabetes    . Anesthesia problems Neg Hx   . Hypotension Neg Hx   . Malignant hyperthermia Neg Hx   . Pseudochol deficiency Neg Hx     History  Substance Use Topics   . Smoking status: Never Smoker   . Smokeless tobacco: Not on file  . Alcohol Use: No      Review of Systems  Constitutional: Positive for fatigue. Negative for activity change.       All ROS Neg except as noted in HPI  HENT: Negative for nosebleeds and neck pain.   Eyes: Negative for photophobia and discharge.  Respiratory: Negative for cough, shortness of breath and wheezing.   Cardiovascular: Negative for chest pain and palpitations.  Gastrointestinal: Negative for abdominal pain and blood in stool.  Genitourinary: Negative for dysuria, frequency and hematuria.  Musculoskeletal: Positive for arthralgias. Negative for back pain.  Skin: Negative.   Neurological: Negative for dizziness, seizures and speech difficulty.  Psychiatric/Behavioral: Negative for hallucinations and confusion.    Allergies  Review of patient's allergies indicates no known allergies.  Home Medications   Current Outpatient Rx  Name  Route  Sig  Dispense  Refill  . aspirin 325 MG tablet   Oral   Take 325 mg by mouth daily.         [name] co-enzyme Q-10 30 MG capsule   Oral   Take 30 mg by mouth 3 (three) times daily.         . hydrochlorothiazide 25 MG tablet   Oral  Take 12.5 mg by mouth daily.          . insulin glargine (LANTUS) 100 UNIT/ML injection   Subcutaneous   Inject 40 Units into the skin at bedtime.          [name] KOMBIGLYZE XR 2.04-999 MG TB24   Oral   Take 1 tablet by mouth 2 (two) times daily.          [name] loratadine (CLARITIN) 10 MG tablet   Oral   Take 10 mg by mouth daily.         [name] olmesartan (BENICAR) 40 MG tablet   Oral   Take 40 mg by mouth daily.         . pravastatin (PRAVACHOL) 20 MG tablet   Oral   Take 20 mg by mouth at bedtime.          . ranitidine (ZANTAC) 150 MG tablet   Oral   Take 150 mg by mouth at bedtime.          . traMADol-acetaminophen (ULTRACET) 37.5-325 MG per tablet   Oral   Take 1 tablet by mouth every 4 (four) hours as needed  for pain.   90 tablet   5     BP 128/82  Pulse 87  Temp(Src) 98.4 F (36.9 C)  Resp 20  Ht 5\' 10"  (1.778 m)  Wt 253 lb (114.76 kg)  BMI 36.3 kg/m2  SpO2 98%  Physical Exam  Nursing note and vitals reviewed. Constitutional: He is oriented to person, place, and time. He appears well-developed and well-nourished.  Non-toxic appearance.  HENT:  Head: Normocephalic.  Right Ear: Tympanic membrane and external ear normal.  Left Ear: Tympanic membrane and external ear normal.  Eyes: EOM and lids are normal. Pupils are equal, round, and reactive to light.  Neck: Normal range of motion. Neck supple. Carotid bruit is not present.  Cardiovascular: Normal rate, regular rhythm, normal heart sounds, intact distal pulses and normal pulses.   Pulmonary/Chest: Breath sounds normal. No respiratory distress.  Abdominal: Soft. Bowel sounds are normal. There is no tenderness. There is no guarding.  Genitourinary:  Patient has a tick embedded in the upper buttocks area. There is no significant increased redness around the bite area. There is no abscess and no red streaking.  Musculoskeletal: Normal range of motion.  There is a small well-healed red mark on the tibial aspect of the lower right leg. There is no red streaking. The area is not hot.  Lymphadenopathy:       Head (right side): No submandibular adenopathy present.       Head (left side): No submandibular adenopathy present.    He has no cervical adenopathy.  Neurological: He is alert and oriented to person, place, and time. He has normal strength. No cranial nerve deficit or sensory deficit.  Skin: Skin is warm and dry.  Psychiatric: He has a normal mood and affect. His speech is normal.    ED Course  Procedures (including critical care time)  Labs Reviewed - No data to display No results found.   No diagnosis found.    MDM  I have reviewed nursing notes, vital signs, and all appropriate lab and imaging results for this  patient. The tick was removed by the nursing staff. I have examined the area under magnification and there is no retained body parts. Discuss with patient and the patient's wife the plan of using the doxycycline twice a day. The family inquired about obtaining titers,  but explained to the patient that we would be treating the patient and the titers would not be needed unless there were more symptoms or complications. Prescription for doxycycline one tablet 2 times daily given to the patient. Patient is advised to see Dr. Hilma Favors or come to the emergency room immediately if any changes, problems, or concerns.       Lenox Ahr, PA-C 05/19/13 2002

## 2013-05-19 NOTE — ED Notes (Signed)
Pt with tick attached to sacral area, tick removed with tweezers, pt states that he removed a tick earlier this week, states with fever for past week as well

## 2013-08-07 ENCOUNTER — Other Ambulatory Visit: Payer: Self-pay | Admitting: *Deleted

## 2013-08-07 DIAGNOSIS — M25561 Pain in right knee: Secondary | ICD-10-CM

## 2013-08-07 MED ORDER — TRAMADOL-ACETAMINOPHEN 37.5-325 MG PO TABS: 1.0000 | ORAL_TABLET | ORAL | Status: DC | PRN

## 2013-10-19 ENCOUNTER — Ambulatory Visit (HOSPITAL_COMMUNITY)
Admission: RE | Admit: 2013-10-19 | Discharge: 2013-10-19 | Disposition: A | Discharge status: Home / Self Care | Payer: Federal, State, Local not specified - PPO | Source: Ambulatory Visit | Attending: Family Medicine | Admitting: Family Medicine

## 2013-10-19 ENCOUNTER — Other Ambulatory Visit (HOSPITAL_COMMUNITY): Payer: Self-pay | Admitting: Family Medicine

## 2013-10-19 DIAGNOSIS — R059 Cough, unspecified: Secondary | ICD-10-CM

## 2013-10-19 DIAGNOSIS — R05 Cough: Secondary | ICD-10-CM

## 2014-02-26 ENCOUNTER — Other Ambulatory Visit: Payer: Self-pay | Admitting: *Deleted

## 2014-02-26 DIAGNOSIS — M25562 Pain in left knee: Principal | ICD-10-CM

## 2014-02-26 DIAGNOSIS — M25561 Pain in right knee: Secondary | ICD-10-CM

## 2014-02-26 MED ORDER — TRAMADOL-ACETAMINOPHEN 37.5-325 MG PO TABS: 1.0000 | ORAL_TABLET | ORAL | Status: DC | PRN

## 2014-12-12 ENCOUNTER — Other Ambulatory Visit: Payer: Self-pay | Admitting: Orthopedic Surgery

## 2014-12-13 DIAGNOSIS — D696 Thrombocytopenia, unspecified: Secondary | ICD-10-CM

## 2014-12-13 DIAGNOSIS — E669 Obesity, unspecified: Secondary | ICD-10-CM | POA: Diagnosis present

## 2014-12-13 DIAGNOSIS — E871 Hypo-osmolality and hyponatremia: Secondary | ICD-10-CM | POA: Diagnosis present

## 2014-12-13 DIAGNOSIS — E1165 Type 2 diabetes mellitus with hyperglycemia: Secondary | ICD-10-CM | POA: Diagnosis present

## 2014-12-13 DIAGNOSIS — D649 Anemia, unspecified: Secondary | ICD-10-CM

## 2014-12-13 HISTORY — DX: Anemia, unspecified: D64.9

## 2014-12-13 HISTORY — DX: Thrombocytopenia, unspecified: D69.6

## 2014-12-18 ENCOUNTER — Other Ambulatory Visit: Payer: Self-pay | Admitting: *Deleted

## 2014-12-18 DIAGNOSIS — M25561 Pain in right knee: Secondary | ICD-10-CM

## 2014-12-18 DIAGNOSIS — M25562 Pain in left knee: Principal | ICD-10-CM

## 2014-12-18 MED ORDER — TRAMADOL-ACETAMINOPHEN 37.5-325 MG PO TABS: 1.0000 | ORAL_TABLET | ORAL | Status: DC | PRN

## 2015-05-05 ENCOUNTER — Encounter (HOSPITAL_COMMUNITY): Payer: Self-pay | Admitting: *Deleted

## 2015-05-05 ENCOUNTER — Emergency Department (HOSPITAL_COMMUNITY)
Admission: EM | Admit: 2015-05-05 | Discharge: 2015-05-05 | Disposition: A | Discharge status: Home / Self Care | Payer: Federal, State, Local not specified - PPO | Attending: Emergency Medicine | Admitting: Emergency Medicine

## 2015-05-05 DIAGNOSIS — S71111A Laceration without foreign body, right thigh, initial encounter: Secondary | ICD-10-CM | POA: Diagnosis not present

## 2015-05-05 DIAGNOSIS — S79921A Unspecified injury of right thigh, initial encounter: Secondary | ICD-10-CM | POA: Diagnosis present

## 2015-05-05 DIAGNOSIS — I10 Essential (primary) hypertension: Secondary | ICD-10-CM | POA: Insufficient documentation

## 2015-05-05 DIAGNOSIS — W260XXA Contact with knife, initial encounter: Secondary | ICD-10-CM | POA: Diagnosis not present

## 2015-05-05 DIAGNOSIS — E780 Pure hypercholesterolemia: Secondary | ICD-10-CM | POA: Insufficient documentation

## 2015-05-05 DIAGNOSIS — IMO0002 Reserved for concepts with insufficient information to code with codable children: Secondary | ICD-10-CM

## 2015-05-05 DIAGNOSIS — Z79899 Other long term (current) drug therapy: Secondary | ICD-10-CM | POA: Insufficient documentation

## 2015-05-05 DIAGNOSIS — Y9389 Activity, other specified: Secondary | ICD-10-CM | POA: Diagnosis not present

## 2015-05-05 DIAGNOSIS — Z794 Long term (current) use of insulin: Secondary | ICD-10-CM | POA: Insufficient documentation

## 2015-05-05 DIAGNOSIS — Z23 Encounter for immunization: Secondary | ICD-10-CM | POA: Diagnosis not present

## 2015-05-05 DIAGNOSIS — M1990 Unspecified osteoarthritis, unspecified site: Secondary | ICD-10-CM | POA: Diagnosis not present

## 2015-05-05 DIAGNOSIS — Y9289 Other specified places as the place of occurrence of the external cause: Secondary | ICD-10-CM | POA: Diagnosis not present

## 2015-05-05 DIAGNOSIS — Y998 Other external cause status: Secondary | ICD-10-CM | POA: Diagnosis not present

## 2015-05-05 DIAGNOSIS — E119 Type 2 diabetes mellitus without complications: Secondary | ICD-10-CM | POA: Diagnosis not present

## 2015-05-05 DIAGNOSIS — Z7982 Long term (current) use of aspirin: Secondary | ICD-10-CM | POA: Insufficient documentation

## 2015-05-05 MED ORDER — LIDOCAINE HCL (PF) 1 % IJ SOLN
INTRAMUSCULAR | Status: AC
  Administered 2015-05-05: 16:00:00
  Filled 2015-05-05: qty 5

## 2015-05-05 MED ORDER — TETANUS-DIPHTH-ACELL PERTUSSIS 5-2.5-18.5 LF-MCG/0.5 IM SUSP
0.5000 mL | Freq: Once | INTRAMUSCULAR | Status: AC
  Administered 2015-05-05: 0.5 mL via INTRAMUSCULAR
  Filled 2015-05-05: qty 0.5

## 2015-05-05 NOTE — Discharge Instructions (Signed)

## 2015-05-05 NOTE — ED Provider Notes (Signed)
CSN: [id number]     Arrival date & time 05/05/15  1401 History    Chief Complaint  Patient presents with  . Laceration    The history is provided by the patient.   HPI  HPI Comments: [name] is a 54 y.o. male who presents himself to the Emergency Department complaining of laceration to his right upper leg occuring just prior to arrival when he was attempting to cut a metal tape using a sharp knife, causing laceration to his upper leg.  It bled copiously but has since resolved after applying pressure.  He denies any other complaints at this time.  He believes he has received tetanus vaccine within the past several years by his PCP.  Past Medical History  Diagnosis Date  . HTN (hypertension)   . Diabetes mellitus   . High cholesterol   . Arthritis    Past Surgical History  Procedure Laterality Date  . Appendectomy    . Right biceps tendon    . Right hand nerve damage    . Left knee arthroscopy    . Knee arthroscopy  left   Family History  Problem Relation Age of Onset  . Arthritis    . Cancer    . Diabetes    . Anesthesia problems Neg Hx   . Hypotension Neg Hx   . Malignant hyperthermia Neg Hx   . Pseudochol deficiency Neg Hx    History  Substance Use Topics  . Smoking status: Never Smoker   . Smokeless tobacco: Not on file  . Alcohol Use: No    Review of Systems  Constitutional: Negative for fever and chills.  HENT: Negative for congestion and sore throat.   Eyes: Negative.   Respiratory: Negative for chest tightness, shortness of breath and wheezing.   Cardiovascular: Negative for chest pain.  Gastrointestinal: Negative for nausea and abdominal pain.  Genitourinary: Negative.   Musculoskeletal: Negative for joint swelling, arthralgias and neck pain.  Skin: Positive for wound. Negative for rash.  Neurological: Negative for dizziness, weakness, light-headedness, numbness and headaches.  Psychiatric/Behavioral: Negative.   All other systems reviewed  and are negative.     Allergies  Review of patient's allergies indicates no known allergies.  Home Medications   Prior to Admission medications   Medication Sig Start Date End Date Taking? Authorizing Provider  aspirin EC 81 MG tablet Take 81 mg by mouth daily.   Yes Historical Provider, MD  hydrochlorothiazide 25 MG tablet Take 12.5 mg by mouth daily.  06/19/11  Yes Historical Provider, MD  insulin lispro protamine-lispro (HUMALOG 75/25 MIX) (75-25) 100 UNIT/ML SUSP injection Inject 70 Units into the skin 2 (two) times daily with a meal.   Yes Historical Provider, MD  KOMBIGLYZE XR 2.04-999 MG TB24 Take 1 tablet by mouth 2 (two) times daily.  07/28/11  Yes Historical Provider, MD  loratadine (CLARITIN) 10 MG tablet Take 10 mg by mouth daily.   Yes Historical Provider, MD  olmesartan (BENICAR) 40 MG tablet Take 40 mg by mouth daily.   Yes Historical Provider, MD  pravastatin (PRAVACHOL) 20 MG tablet Take 20 mg by mouth at bedtime.    Yes Historical Provider, MD  ranitidine (ZANTAC) 150 MG tablet Take 150 mg by mouth at bedtime.  09/13/11  Yes Historical Provider, MD  traMADol-acetaminophen (ULTRACET) 37.5-325 MG per tablet Take 1 tablet by mouth every 4 (four) hours as needed. Patient not taking: Reported on 05/05/2015 12/18/14   Carole Civil, MD  BP 135/84 mmHg  Pulse 93  Temp(Src) 98 F (36.7 C) (Oral)  Resp 18  Ht 5' 10.5" (1.791 m)  Wt 248 lb (112.492 kg)  BMI 35.07 kg/m2  SpO2 97% Physical Exam  Constitutional: He is oriented to person, place, and time. He appears well-developed and well-nourished.  HENT:  Head: Normocephalic and atraumatic.  Neck: Normal range of motion.  Cardiovascular: Normal rate.   Pulses equal bilaterally  Pulmonary/Chest: Effort normal.  Musculoskeletal: He exhibits tenderness.  Neurological: He is alert and oriented to person, place, and time. He has normal strength. He displays normal reflexes. No sensory deficit.  Skin: Skin is warm and dry.  Laceration noted.  1 cm subcutaneous laceration right upper medial thigh.  Hemostatic.  Linear.  Psychiatric: He has a normal mood and affect.  Nursing note and vitals reviewed.   ED Course  Procedures   LACERATION REPAIR Performed by: Evalee Jefferson Authorized by: Evalee Jefferson Consent: Verbal consent obtained. Risks and benefits: risks, benefits and alternatives were discussed Consent given by: patient Patient identity confirmed: provided demographic data Prepped and Draped in normal sterile fashion Wound explored  Laceration Location: right thigh  Laceration Length: 1cm  No Foreign Bodies seen or palpated  Anesthesia: local infiltration  Local anesthetic: lidocaine 1% without epinephrine  Anesthetic total: 2 ml  Irrigation method: syringe Amount of cleaning: Copious using normal saline flush after sig cleanse wound cleaner.  Scrubbed with 4 x 4's.  Base of wound visualized, no foreign body.    Skin closure: ethilon 4-0  Number of sutures: 3  Technique: simple interrupted   Patient tolerance: Patient tolerated the procedure well with no immediate complications.   DIAGNOSTIC STUDIES: Oxygen Saturation is 97% on RA, normal by my interpretation.    COORDINATION OF CARE: 6:02 PM Discussed treatment plan with pt at bedside including sutures and pt agreed to plan.  Labs Review Labs Reviewed - No data to display  Imaging Review No results found.   EKG Interpretation None      MDM   Final diagnoses:  Laceration    Call placed to PCPs office, there is no record of patient being up-to-date on his vaccines.  This was discussed with him and he agrees to being updated today.  This vaccine was given.  Wound care instructions given.  Pt advised to have sutures removed in 10 days,  Return here sooner for any signs of infection including redness, swelling, worse pain or drainage of pus.      Evalee Jefferson, PA-C 05/05/15 1807  Evalee Jefferson, PA-C 05/05/15  1808  Dorie Rank, MD 05/08/15 1600

## 2015-05-05 NOTE — ED Notes (Addendum)
Pt was cutting a metal pipe when the pipe slipped cutting inside of right thigh, bleeding controlled at present, per pt tetanus is up to date,

## 2015-08-22 ENCOUNTER — Encounter (HOSPITAL_COMMUNITY): Payer: Self-pay | Admitting: *Deleted

## 2015-08-22 ENCOUNTER — Inpatient Hospital Stay (HOSPITAL_COMMUNITY)
Admission: EM | Admit: 2015-08-22 | Discharge: 2015-09-04 | DRG: 234 | Disposition: A | Discharge status: Home Health | Payer: Federal, State, Local not specified - PPO | Attending: Thoracic Surgery (Cardiothoracic Vascular Surgery) | Admitting: Thoracic Surgery (Cardiothoracic Vascular Surgery)

## 2015-08-22 ENCOUNTER — Emergency Department (HOSPITAL_COMMUNITY): Payer: Federal, State, Local not specified - PPO

## 2015-08-22 ENCOUNTER — Other Ambulatory Visit: Payer: Self-pay

## 2015-08-22 DIAGNOSIS — I25110 Atherosclerotic heart disease of native coronary artery with unstable angina pectoris: Secondary | ICD-10-CM | POA: Diagnosis present

## 2015-08-22 DIAGNOSIS — Z6835 Body mass index (BMI) 35.0-35.9, adult: Secondary | ICD-10-CM

## 2015-08-22 DIAGNOSIS — E119 Type 2 diabetes mellitus without complications: Secondary | ICD-10-CM | POA: Diagnosis present

## 2015-08-22 DIAGNOSIS — IMO0002 Reserved for concepts with insufficient information to code with codable children: Secondary | ICD-10-CM | POA: Diagnosis present

## 2015-08-22 DIAGNOSIS — N179 Acute kidney failure, unspecified: Secondary | ICD-10-CM | POA: Diagnosis present

## 2015-08-22 DIAGNOSIS — I25118 Atherosclerotic heart disease of native coronary artery with other forms of angina pectoris: Secondary | ICD-10-CM | POA: Insufficient documentation

## 2015-08-22 DIAGNOSIS — I2510 Atherosclerotic heart disease of native coronary artery without angina pectoris: Secondary | ICD-10-CM | POA: Insufficient documentation

## 2015-08-22 DIAGNOSIS — I5022 Chronic systolic (congestive) heart failure: Secondary | ICD-10-CM | POA: Insufficient documentation

## 2015-08-22 DIAGNOSIS — I10 Essential (primary) hypertension: Secondary | ICD-10-CM | POA: Diagnosis present

## 2015-08-22 DIAGNOSIS — E860 Dehydration: Secondary | ICD-10-CM | POA: Diagnosis present

## 2015-08-22 DIAGNOSIS — D62 Acute posthemorrhagic anemia: Secondary | ICD-10-CM | POA: Diagnosis not present

## 2015-08-22 DIAGNOSIS — Z79899 Other long term (current) drug therapy: Secondary | ICD-10-CM

## 2015-08-22 DIAGNOSIS — E785 Hyperlipidemia, unspecified: Secondary | ICD-10-CM | POA: Diagnosis present

## 2015-08-22 DIAGNOSIS — M1990 Unspecified osteoarthritis, unspecified site: Secondary | ICD-10-CM | POA: Diagnosis present

## 2015-08-22 DIAGNOSIS — Z951 Presence of aortocoronary bypass graft: Secondary | ICD-10-CM

## 2015-08-22 DIAGNOSIS — I214 Non-ST elevation (NSTEMI) myocardial infarction: Secondary | ICD-10-CM | POA: Diagnosis present

## 2015-08-22 DIAGNOSIS — D696 Thrombocytopenia, unspecified: Secondary | ICD-10-CM | POA: Diagnosis present

## 2015-08-22 DIAGNOSIS — R0602 Shortness of breath: Secondary | ICD-10-CM | POA: Diagnosis not present

## 2015-08-22 DIAGNOSIS — T502X5A Adverse effect of carbonic-anhydrase inhibitors, benzothiadiazides and other diuretics, initial encounter: Secondary | ICD-10-CM | POA: Diagnosis present

## 2015-08-22 DIAGNOSIS — R739 Hyperglycemia, unspecified: Secondary | ICD-10-CM

## 2015-08-22 DIAGNOSIS — E876 Hypokalemia: Secondary | ICD-10-CM | POA: Diagnosis not present

## 2015-08-22 DIAGNOSIS — Z833 Family history of diabetes mellitus: Secondary | ICD-10-CM

## 2015-08-22 DIAGNOSIS — E1165 Type 2 diabetes mellitus with hyperglycemia: Secondary | ICD-10-CM | POA: Diagnosis present

## 2015-08-22 DIAGNOSIS — Z7982 Long term (current) use of aspirin: Secondary | ICD-10-CM

## 2015-08-22 DIAGNOSIS — R197 Diarrhea, unspecified: Secondary | ICD-10-CM | POA: Diagnosis present

## 2015-08-22 DIAGNOSIS — R079 Chest pain, unspecified: Secondary | ICD-10-CM

## 2015-08-22 DIAGNOSIS — Z794 Long term (current) use of insulin: Secondary | ICD-10-CM

## 2015-08-22 DIAGNOSIS — E66813 Obesity, class 3: Secondary | ICD-10-CM | POA: Insufficient documentation

## 2015-08-22 DIAGNOSIS — Z8249 Family history of ischemic heart disease and other diseases of the circulatory system: Secondary | ICD-10-CM

## 2015-08-22 DIAGNOSIS — J9811 Atelectasis: Secondary | ICD-10-CM | POA: Diagnosis present

## 2015-08-22 LAB — URINALYSIS, ROUTINE W REFLEX MICROSCOPIC
Bilirubin Urine: NEGATIVE
Glucose, UA: >1000 mg/dL — AB
Hgb urine dipstick: NEGATIVE
Ketones, ur: 15 mg/dL — AB
Leukocytes, UA: NEGATIVE
Nitrite: NEGATIVE
Protein, ur: NEGATIVE mg/dL
Specific Gravity, Urine: 1.01 (ref 1.005–1.030)
Urobilinogen, UA: 0.2 mg/dL (ref 0.0–1.0)
pH: 5.5 (ref 5.0–8.0)

## 2015-08-22 LAB — COMPREHENSIVE METABOLIC PANEL
ALT: 34 U/L (ref 17–63)
AST: 59 U/L — ABNORMAL HIGH (ref 15–41)
Albumin: 4 g/dL (ref 3.5–5.0)
Alkaline Phosphatase: 44 U/L (ref 38–126)
Anion gap: 14 (ref 5–15)
BUN: 22 mg/dL — ABNORMAL HIGH (ref 6–20)
CO2: 21 mmol/L — ABNORMAL LOW (ref 22–32)
Calcium: 8.9 mg/dL (ref 8.9–10.3)
Chloride: 101 mmol/L (ref 101–111)
Creatinine, Ser: 1.49 mg/dL — ABNORMAL HIGH (ref 0.61–1.24)
GFR calc Af Amer: 59 mL/min — ABNORMAL LOW (ref >60)
GFR calc non Af Amer: 51 mL/min — ABNORMAL LOW (ref >60)
Glucose, Bld: 459 mg/dL — ABNORMAL HIGH (ref 65–99)
Potassium: 4.2 mmol/L (ref 3.5–5.1)
Sodium: 136 mmol/L (ref 135–145)
Total Bilirubin: 1.1 mg/dL (ref 0.3–1.2)
Total Protein: 7.4 g/dL (ref 6.5–8.1)

## 2015-08-22 LAB — CBC WITH DIFFERENTIAL/PLATELET
Basophils Absolute: 0 10*3/uL (ref 0.0–0.1)
Basophils Relative: 0 % (ref 0–1)
Eosinophils Absolute: 0 10*3/uL (ref 0.0–0.7)
Eosinophils Relative: 0 % (ref 0–5)
HCT: 39.4 % (ref 39.0–52.0)
Hemoglobin: 14 g/dL (ref 13.0–17.0)
Lymphocytes Relative: 4 % — ABNORMAL LOW (ref 12–46)
Lymphs Abs: 0.4 10*3/uL — ABNORMAL LOW (ref 0.7–4.0)
MCH: 32.7 pg (ref 26.0–34.0)
MCHC: 35.5 g/dL (ref 30.0–36.0)
MCV: 92.1 fL (ref 78.0–100.0)
Monocytes Absolute: 0.1 10*3/uL (ref 0.1–1.0)
Monocytes Relative: 1 % — ABNORMAL LOW (ref 3–12)
Neutro Abs: 9.1 10*3/uL — ABNORMAL HIGH (ref 1.7–7.7)
Neutrophils Relative %: 95 % — ABNORMAL HIGH (ref 43–77)
Platelets: 171 10*3/uL (ref 150–400)
RBC: 4.28 MIL/uL (ref 4.22–5.81)
RDW: 13.3 % (ref 11.5–15.5)
WBC: 9.7 10*3/uL (ref 4.0–10.5)

## 2015-08-22 LAB — URINE MICROSCOPIC-ADD ON

## 2015-08-22 LAB — TROPONIN I: Troponin I: <0.03 ng/mL (ref <0.031)

## 2015-08-22 LAB — CBG MONITORING, ED: Glucose-Capillary: 461 mg/dL — ABNORMAL HIGH (ref 65–99)

## 2015-08-22 MED ORDER — NITROGLYCERIN 2 % TD OINT
1.0000 [in_us] | TOPICAL_OINTMENT | Freq: Once | TRANSDERMAL | Status: AC
  Administered 2015-08-22: 1 [in_us] via TOPICAL
  Filled 2015-08-22: qty 1

## 2015-08-22 MED ORDER — NITROGLYCERIN 0.4 MG SL SUBL: 0.4000 mg | SUBLINGUAL_TABLET | SUBLINGUAL | Status: DC | PRN

## 2015-08-22 MED ORDER — ASPIRIN 81 MG PO CHEW
81.0000 mg | CHEWABLE_TABLET | Freq: Once | ORAL | Status: AC
  Administered 2015-08-22: 81 mg via ORAL
  Filled 2015-08-22: qty 1

## 2015-08-22 NOTE — ED Provider Notes (Signed)
CSN: [id number]     Arrival date & time 08/22/15  1946 History  This chart was scribed for Delora Fuel, MD by Starleen Arms, ED Scribe. This patient was seen in room APA06/APA06 and the patient's care was started at 11:05 PM.   Chief Complaint  Patient presents with  . Chest Pain   The history is provided by the patient. No language interpreter was used.   HPI Comments: [name] is a 54 y.o. obese male with hx of HTN, DM, HLD who presents to the Emergency Department complaining of non-radiating, 8/10 at worst, 3/10 currently central chest pressure onset 7:30 pm after eating while watching TV.  He has taken 3 baby ASA and 1 NTG with transient relief.  There are no aggravating/alleviating factors.  Associated symptoms include mild, improving SOB; brief, transient nausea; and diaphoresis.  He reports a family hx of heart disease in father and younger brother (49 MI).  Patient does not use tobacco.    Patient had a normal cardiac stress test 5 years ago.   Past Medical History  Diagnosis Date  . HTN (hypertension)   . Diabetes mellitus   . High cholesterol   . Arthritis    Past Surgical History  Procedure Laterality Date  . Appendectomy    . Right biceps tendon    . Right hand nerve damage    . Left knee arthroscopy    . Knee arthroscopy  left   Family History  Problem Relation Age of Onset  . Arthritis    . Cancer    . Diabetes    . Anesthesia problems Neg Hx   . Hypotension Neg Hx   . Malignant hyperthermia Neg Hx   . Pseudochol deficiency Neg Hx    Social History  Substance Use Topics  . Smoking status: Never Smoker   . Smokeless tobacco: None  . Alcohol Use: No    Review of Systems A complete 10 system review of systems was obtained and all systems are negative except as noted in the HPI and PMH.   Allergies  Review of patient's allergies indicates no known allergies.  Home Medications   Prior to Admission medications   Medication Sig Start Date End Date  Taking? Authorizing Provider  aspirin EC 81 MG tablet Take 81 mg by mouth daily.   Yes Historical Provider, MD  hydrochlorothiazide 25 MG tablet Take 12.5 mg by mouth daily.  06/19/11  Yes Historical Provider, MD  insulin lispro protamine-lispro (HUMALOG 75/25 MIX) (75-25) 100 UNIT/ML SUSP injection Inject 70 Units into the skin 2 (two) times daily with a meal.   Yes Historical Provider, MD  KOMBIGLYZE XR 2.04-999 MG TB24 Take 1 tablet by mouth 2 (two) times daily.  07/28/11  Yes Historical Provider, MD  loratadine (CLARITIN) 10 MG tablet Take 10 mg by mouth daily.   Yes Historical Provider, MD  olmesartan (BENICAR) 40 MG tablet Take 40 mg by mouth daily.   Yes Historical Provider, MD  pravastatin (PRAVACHOL) 20 MG tablet Take 20 mg by mouth at bedtime.    Yes Historical Provider, MD  ranitidine (ZANTAC) 150 MG tablet Take 150 mg by mouth at bedtime.  09/13/11  Yes Historical Provider, MD  traMADol-acetaminophen (ULTRACET) 37.5-325 MG per tablet Take 1 tablet by mouth every 4 (four) hours as needed. Patient not taking: Reported on 05/05/2015 12/18/14   Carole Civil, MD   BP 108/71 mmHg  Pulse 105  Temp(Src) 97.8 F (36.6 C) (Oral)  Resp  27  Ht 5\' 10"  (1.778 m)  Wt 263 lb (119.296 kg)  BMI 37.74 kg/m2  SpO2 95% Physical Exam  Constitutional: He is oriented to person, place, and time. He appears well-developed and well-nourished. No distress.  HENT:  Head: Normocephalic and atraumatic.  Eyes: Conjunctivae and EOM are normal. Pupils are equal, round, and reactive to light.  Neck: Normal range of motion. Neck supple. No tracheal deviation present.  Cardiovascular: Normal rate and regular rhythm.   1/6 systolic ejection murmur at lower left sternal border.    Pulmonary/Chest: Effort normal and breath sounds normal. He has no wheezes. He has no rales. He exhibits no tenderness.  Abdominal: Soft. Bowel sounds are normal. He exhibits no distension and no mass. There is no tenderness.   Musculoskeletal: Normal range of motion. He exhibits no edema.  2+ pitting edema.  Venus stasis changes noted right>left  Neurological: He is alert and oriented to person, place, and time. No cranial nerve deficit. He exhibits normal muscle tone. Coordination normal.  Skin: Skin is warm and dry. No rash noted.  Psychiatric: He has a normal mood and affect. His behavior is normal. Judgment and thought content normal.  Nursing note and vitals reviewed.   ED Course  Procedures (including critical care time)  DIAGNOSTIC STUDIES: Oxygen Saturation is 95% on room air, normal by my interpretation.    COORDINATION OF CARE:  11:13 PM Will order repeat labs, consult cardiologist, and possibly transfer to [facility].  Patient acknowledges and agrees with plan.    Labs Review Results for orders placed or performed during the hospital encounter of 08/22/15  Comprehensive metabolic panel  Result Value Ref Range   Sodium 136 135 - 145 mmol/L   Potassium 4.2 3.5 - 5.1 mmol/L   Chloride 101 101 - 111 mmol/L   CO2 21 (L) 22 - 32 mmol/L   Glucose, Bld 459 (H) 65 - 99 mg/dL   BUN 22 (H) 6 - 20 mg/dL   Creatinine, Ser 1.49 (H) 0.61 - 1.24 mg/dL   Calcium 8.9 8.9 - 10.3 mg/dL   Total Protein 7.4 6.5 - 8.1 g/dL   Albumin 4.0 3.5 - 5.0 g/dL   AST 59 (H) 15 - 41 U/L   ALT 34 17 - 63 U/L   Alkaline Phosphatase 44 38 - 126 U/L   Total Bilirubin 1.1 0.3 - 1.2 mg/dL   GFR calc non Af Amer 51 (L) >60 mL/min   GFR calc Af Amer 59 (L) >60 mL/min   Anion gap 14 5 - 15  Troponin I  Result Value Ref Range   Troponin I <0.03 <0.031 ng/mL  Urinalysis, Routine w reflex microscopic (not at [facility])  Result Value Ref Range   Color, Urine YELLOW YELLOW   APPearance CLEAR CLEAR   Specific Gravity, Urine 1.010 1.005 - 1.030   pH 5.5 5.0 - 8.0   Glucose, UA >1000 (A) NEGATIVE mg/dL   Hgb urine dipstick NEGATIVE NEGATIVE   Bilirubin Urine NEGATIVE NEGATIVE   Ketones, ur 15 (A) NEGATIVE mg/dL   Protein, ur  NEGATIVE NEGATIVE mg/dL   Urobilinogen, UA 0.2 0.0 - 1.0 mg/dL   Nitrite NEGATIVE NEGATIVE   Leukocytes, UA NEGATIVE NEGATIVE  CBC with Differential  Result Value Ref Range   WBC 9.7 4.0 - 10.5 K/uL   RBC 4.28 4.22 - 5.81 MIL/uL   Hemoglobin 14.0 13.0 - 17.0 g/dL   HCT 39.4 39.0 - 52.0 %   MCV 92.1 78.0 - 100.0 fL  MCH 32.7 26.0 - 34.0 pg   MCHC 35.5 30.0 - 36.0 g/dL   RDW 13.3 11.5 - 15.5 %   Platelets 171 150 - 400 K/uL   Neutrophils Relative % 95 (H) 43 - 77 %   Neutro Abs 9.1 (H) 1.7 - 7.7 K/uL   Lymphocytes Relative 4 (L) 12 - 46 %   Lymphs Abs 0.4 (L) 0.7 - 4.0 K/uL   Monocytes Relative 1 (L) 3 - 12 %   Monocytes Absolute 0.1 0.1 - 1.0 K/uL   Eosinophils Relative 0 0 - 5 %   Eosinophils Absolute 0.0 0.0 - 0.7 K/uL   Basophils Relative 0 0 - 1 %   Basophils Absolute 0.0 0.0 - 0.1 K/uL  Urine microscopic-add on  Result Value Ref Range   Squamous Epithelial / LPF RARE RARE   WBC, UA 0-2 <3 WBC/hpf   Bacteria, UA RARE RARE   Urine-Other RARE YEAST   Troponin I  Result Value Ref Range   Troponin I 0.48 (H) <0.031 ng/mL  Protime-INR  Result Value Ref Range   Prothrombin Time 15.3 (H) 11.6 - 15.2 seconds   INR 1.20 0.00 - 1.49  APTT  Result Value Ref Range   aPTT 30 24 - 37 seconds  Glucose, capillary  Result Value Ref Range   Glucose-Capillary 333 (H) 65 - 99 mg/dL  CBC WITH DIFFERENTIAL  Result Value Ref Range   WBC 7.2 4.0 - 10.5 K/uL   RBC 3.95 (L) 4.22 - 5.81 MIL/uL   Hemoglobin 12.5 (L) 13.0 - 17.0 g/dL   HCT 36.5 (L) 39.0 - 52.0 %   MCV 92.4 78.0 - 100.0 fL   MCH 31.6 26.0 - 34.0 pg   MCHC 34.2 30.0 - 36.0 g/dL   RDW 13.3 11.5 - 15.5 %   Platelets 145 (L) 150 - 400 K/uL   Neutrophils Relative % 84 (H) 43 - 77 %   Neutro Abs 6.0 1.7 - 7.7 K/uL   Lymphocytes Relative 11 (L) 12 - 46 %   Lymphs Abs 0.8 0.7 - 4.0 K/uL   Monocytes Relative 5 3 - 12 %   Monocytes Absolute 0.4 0.1 - 1.0 K/uL   Eosinophils Relative 0 0 - 5 %   Eosinophils Absolute 0.0 0.0 -  0.7 K/uL   Basophils Relative 0 0 - 1 %   Basophils Absolute 0.0 0.0 - 0.1 K/uL  CBG monitoring, ED  Result Value Ref Range   Glucose-Capillary 461 (H) 65 - 99 mg/dL  CBG monitoring, ED  Result Value Ref Range   Glucose-Capillary 356 (H) 65 - 99 mg/dL    Imaging Review Dg Chest 2 View  08/22/2015   CLINICAL DATA:  Mid chest pressure, diaphoresis, shortness of breath onset 1.5 hours ago.  EXAM: CHEST  2 VIEW  COMPARISON:  10/19/2013  FINDINGS: Normal heart size and pulmonary vascularity. No focal airspace disease or consolidation in the lungs. No blunting of costophrenic angles. No pneumothorax. Mediastinal contours appear intact. Degenerative changes in the spine.  IMPRESSION: No active cardiopulmonary disease.   Electronically Signed   By: Lucienne Capers M.D.   On: 08/22/2015 21:26   I have personally reviewed and evaluated these images and lab results as part of my medical decision-making.   EKG Interpretation   Date/Time:  Friday August 22 2015 19:52:55 EDT Ventricular Rate:  113 PR Interval:  148 QRS Duration: 94 QT Interval:  336 QTC Calculation: 460 R Axis:   54 Text Interpretation:  Sinus tachycardia Low voltage QRS Nonspecific ST and  T wave abnormality Abnormal ECG When compared with ECG of 10/15/2011, No  significant change was found Confirmed by [facility]  MD, Pericles Carmicheal ([id number]) on  08/22/2015 11:03:00 PM      CRITICAL CARE Performed by: [id number] Total critical care time: 45 minutes Critical care time was exclusive of separately billable procedures and treating other patients. Critical care was necessary to treat or prevent imminent or life-threatening deterioration. Critical care was time spent personally by me on the following activities: development of treatment plan with patient and/or surrogate as well as nursing, discussions with consultants, evaluation of patient's response to treatment, examination of patient, obtaining history from patient or surrogate, ordering  and performing treatments and interventions, ordering and review of laboratory studies, ordering and review of radiographic studies, pulse oximetry and re-evaluation of patient's condition. MDM   Final diagnoses:  Chest pain, unspecified chest pain type  Hyperglycemia    Chest pain with partial, temporary, relief from nitroglycerin.He does have significant risk factors including diabetes, hypertension, hyperlipidemia, and strong family history of premature coronary atherosclerosis. ECG does not show acute changes and initial troponin is negative. He is given nitroglycerin in the ED with complete relief of discomfort and is placed on nitroglycerin ointment. Case is discussed with Dr. Hal Hope of [facility] who agrees to admit the patient. I feel he needs to be inpatient since he probably should have catheterization done even if serial troponins are negative. He will need to be transferred to [facility] because of lack of cardiology coverage over the weekend. Hyperglycemia is present and patient stated that he received destroyed injection which may have exacerbated his hyperglycemia. No evidence of ketoacidosis.  I personally performed the services described in this documentation, which was scribed in my presence. The recorded information has been reviewed and is accurNote: Repeat troponin had been ordered at the time admission was arranged. This has come back after patient has left my care and is elevated, so he apparently has a non-STEMI.      Delora Fuel, MD [id number]

## 2015-08-22 NOTE — ED Notes (Signed)
Pt states he was sitting in a chair and he began having centralized chest pain/pressure. Pt also reports diaphoresis and sob. Pt took 1 nitro with minimal relief and 3- 81 mg baby aspirin.

## 2015-08-23 ENCOUNTER — Encounter (HOSPITAL_COMMUNITY): Payer: Self-pay | Admitting: Internal Medicine

## 2015-08-23 DIAGNOSIS — D62 Acute posthemorrhagic anemia: Secondary | ICD-10-CM | POA: Diagnosis not present

## 2015-08-23 DIAGNOSIS — I25118 Atherosclerotic heart disease of native coronary artery with other forms of angina pectoris: Secondary | ICD-10-CM | POA: Diagnosis not present

## 2015-08-23 DIAGNOSIS — I5022 Chronic systolic (congestive) heart failure: Secondary | ICD-10-CM | POA: Diagnosis present

## 2015-08-23 DIAGNOSIS — E860 Dehydration: Secondary | ICD-10-CM | POA: Diagnosis present

## 2015-08-23 DIAGNOSIS — I219 Acute myocardial infarction, unspecified: Secondary | ICD-10-CM | POA: Insufficient documentation

## 2015-08-23 DIAGNOSIS — E785 Hyperlipidemia, unspecified: Secondary | ICD-10-CM | POA: Diagnosis present

## 2015-08-23 DIAGNOSIS — Z7982 Long term (current) use of aspirin: Secondary | ICD-10-CM | POA: Diagnosis not present

## 2015-08-23 DIAGNOSIS — I10 Essential (primary) hypertension: Secondary | ICD-10-CM | POA: Diagnosis present

## 2015-08-23 DIAGNOSIS — M1990 Unspecified osteoarthritis, unspecified site: Secondary | ICD-10-CM | POA: Diagnosis present

## 2015-08-23 DIAGNOSIS — R079 Chest pain, unspecified: Secondary | ICD-10-CM | POA: Diagnosis not present

## 2015-08-23 DIAGNOSIS — J9811 Atelectasis: Secondary | ICD-10-CM | POA: Diagnosis present

## 2015-08-23 DIAGNOSIS — E119 Type 2 diabetes mellitus without complications: Secondary | ICD-10-CM | POA: Insufficient documentation

## 2015-08-23 DIAGNOSIS — E1165 Type 2 diabetes mellitus with hyperglycemia: Secondary | ICD-10-CM | POA: Diagnosis present

## 2015-08-23 DIAGNOSIS — Z6835 Body mass index (BMI) 35.0-35.9, adult: Secondary | ICD-10-CM | POA: Diagnosis not present

## 2015-08-23 DIAGNOSIS — E11641 Type 2 diabetes mellitus with hypoglycemia with coma: Secondary | ICD-10-CM | POA: Insufficient documentation

## 2015-08-23 DIAGNOSIS — I214 Non-ST elevation (NSTEMI) myocardial infarction: Secondary | ICD-10-CM | POA: Diagnosis present

## 2015-08-23 DIAGNOSIS — R739 Hyperglycemia, unspecified: Secondary | ICD-10-CM | POA: Diagnosis not present

## 2015-08-23 DIAGNOSIS — D696 Thrombocytopenia, unspecified: Secondary | ICD-10-CM | POA: Diagnosis present

## 2015-08-23 DIAGNOSIS — Z833 Family history of diabetes mellitus: Secondary | ICD-10-CM | POA: Diagnosis not present

## 2015-08-23 DIAGNOSIS — R0602 Shortness of breath: Secondary | ICD-10-CM | POA: Diagnosis present

## 2015-08-23 DIAGNOSIS — Z8249 Family history of ischemic heart disease and other diseases of the circulatory system: Secondary | ICD-10-CM | POA: Diagnosis not present

## 2015-08-23 DIAGNOSIS — Z01810 Encounter for preprocedural cardiovascular examination: Secondary | ICD-10-CM | POA: Diagnosis not present

## 2015-08-23 DIAGNOSIS — R197 Diarrhea, unspecified: Secondary | ICD-10-CM | POA: Diagnosis present

## 2015-08-23 DIAGNOSIS — I25110 Atherosclerotic heart disease of native coronary artery with unstable angina pectoris: Secondary | ICD-10-CM | POA: Diagnosis present

## 2015-08-23 DIAGNOSIS — N179 Acute kidney failure, unspecified: Secondary | ICD-10-CM | POA: Diagnosis present

## 2015-08-23 DIAGNOSIS — I2510 Atherosclerotic heart disease of native coronary artery without angina pectoris: Secondary | ICD-10-CM | POA: Diagnosis not present

## 2015-08-23 DIAGNOSIS — Z794 Long term (current) use of insulin: Secondary | ICD-10-CM | POA: Diagnosis not present

## 2015-08-23 DIAGNOSIS — Z79899 Other long term (current) drug therapy: Secondary | ICD-10-CM | POA: Diagnosis not present

## 2015-08-23 DIAGNOSIS — T502X5A Adverse effect of carbonic-anhydrase inhibitors, benzothiadiazides and other diuretics, initial encounter: Secondary | ICD-10-CM | POA: Diagnosis present

## 2015-08-23 DIAGNOSIS — IMO0002 Reserved for concepts with insufficient information to code with codable children: Secondary | ICD-10-CM | POA: Diagnosis present

## 2015-08-23 DIAGNOSIS — E876 Hypokalemia: Secondary | ICD-10-CM | POA: Diagnosis not present

## 2015-08-23 LAB — GLUCOSE, CAPILLARY
Glucose-Capillary: 228 mg/dL — ABNORMAL HIGH (ref 65–99)
Glucose-Capillary: 248 mg/dL — ABNORMAL HIGH (ref 65–99)
Glucose-Capillary: 263 mg/dL — ABNORMAL HIGH (ref 65–99)
Glucose-Capillary: 296 mg/dL — ABNORMAL HIGH (ref 65–99)
Glucose-Capillary: 304 mg/dL — ABNORMAL HIGH (ref 65–99)
Glucose-Capillary: 333 mg/dL — ABNORMAL HIGH (ref 65–99)

## 2015-08-23 LAB — TROPONIN I
Troponin I: 0.48 ng/mL — ABNORMAL HIGH (ref <0.031)
Troponin I: 5.35 ng/mL (ref <0.031)
Troponin I: 6.09 ng/mL (ref <0.031)
Troponin I: 4.17 ng/mL (ref <0.031)

## 2015-08-23 LAB — PROTIME-INR
INR: 1.2 (ref 0.00–1.49)
Prothrombin Time: 15.3 seconds — ABNORMAL HIGH (ref 11.6–15.2)

## 2015-08-23 LAB — CBC WITH DIFFERENTIAL/PLATELET
Basophils Absolute: 0 10*3/uL (ref 0.0–0.1)
Basophils Relative: 0 % (ref 0–1)
Eosinophils Absolute: 0 10*3/uL (ref 0.0–0.7)
Eosinophils Relative: 0 % (ref 0–5)
HCT: 36.5 % — ABNORMAL LOW (ref 39.0–52.0)
Hemoglobin: 12.5 g/dL — ABNORMAL LOW (ref 13.0–17.0)
Lymphocytes Relative: 11 % — ABNORMAL LOW (ref 12–46)
Lymphs Abs: 0.8 10*3/uL (ref 0.7–4.0)
MCH: 31.6 pg (ref 26.0–34.0)
MCHC: 34.2 g/dL (ref 30.0–36.0)
MCV: 92.4 fL (ref 78.0–100.0)
Monocytes Absolute: 0.4 10*3/uL (ref 0.1–1.0)
Monocytes Relative: 5 % (ref 3–12)
Neutro Abs: 6 10*3/uL (ref 1.7–7.7)
Neutrophils Relative %: 84 % — ABNORMAL HIGH (ref 43–77)
Platelets: 145 10*3/uL — ABNORMAL LOW (ref 150–400)
RBC: 3.95 MIL/uL — ABNORMAL LOW (ref 4.22–5.81)
RDW: 13.3 % (ref 11.5–15.5)
WBC: 7.2 10*3/uL (ref 4.0–10.5)

## 2015-08-23 LAB — COMPREHENSIVE METABOLIC PANEL
ALT: 29 U/L (ref 17–63)
AST: 71 U/L — ABNORMAL HIGH (ref 15–41)
Albumin: 3.4 g/dL — ABNORMAL LOW (ref 3.5–5.0)
Alkaline Phosphatase: 37 U/L — ABNORMAL LOW (ref 38–126)
Anion gap: 13 (ref 5–15)
BUN: 25 mg/dL — ABNORMAL HIGH (ref 6–20)
CO2: 22 mmol/L (ref 22–32)
Calcium: 9 mg/dL (ref 8.9–10.3)
Chloride: 103 mmol/L (ref 101–111)
Creatinine, Ser: 1.47 mg/dL — ABNORMAL HIGH (ref 0.61–1.24)
GFR calc Af Amer: >60 mL/min (ref >60)
GFR calc non Af Amer: 52 mL/min — ABNORMAL LOW (ref >60)
Glucose, Bld: 330 mg/dL — ABNORMAL HIGH (ref 65–99)
Potassium: 4.5 mmol/L (ref 3.5–5.1)
Sodium: 138 mmol/L (ref 135–145)
Total Bilirubin: 0.7 mg/dL (ref 0.3–1.2)
Total Protein: 6.2 g/dL — ABNORMAL LOW (ref 6.5–8.1)

## 2015-08-23 LAB — HEPARIN LEVEL (UNFRACTIONATED)
Heparin Unfractionated: 0.23 IU/mL — ABNORMAL LOW (ref 0.30–0.70)
Heparin Unfractionated: 0.3 IU/mL (ref 0.30–0.70)

## 2015-08-23 LAB — CBG MONITORING, ED: Glucose-Capillary: 356 mg/dL — ABNORMAL HIGH (ref 65–99)

## 2015-08-23 LAB — APTT: aPTT: 30 seconds (ref 24–37)

## 2015-08-23 MED ORDER — IRBESARTAN 150 MG PO TABS
150.0000 mg | ORAL_TABLET | Freq: Every day | ORAL | Status: DC
  Administered 2015-08-24 – 2015-08-28 (×5): 150 mg via ORAL
  Filled 2015-08-23 (×6): qty 1

## 2015-08-23 MED ORDER — HEPARIN BOLUS VIA INFUSION
4000.0000 [IU] | Freq: Once | INTRAVENOUS | Status: AC
  Administered 2015-08-23: 4000 [IU] via INTRAVENOUS

## 2015-08-23 MED ORDER — METOPROLOL TARTRATE 12.5 MG HALF TABLET
12.5000 mg | ORAL_TABLET | Freq: Two times a day (BID) | ORAL | Status: DC
  Administered 2015-08-24 – 2015-08-28 (×10): 12.5 mg via ORAL
  Filled 2015-08-23 (×15): qty 1

## 2015-08-23 MED ORDER — NITROGLYCERIN 0.4 MG/HR TD PT24
0.4000 mg | MEDICATED_PATCH | Freq: Every day | TRANSDERMAL | Status: DC
  Administered 2015-08-23 – 2015-08-24 (×2): 0.4 mg via TRANSDERMAL
  Filled 2015-08-23 (×7): qty 1

## 2015-08-23 MED ORDER — IRBESARTAN 300 MG PO TABS
300.0000 mg | ORAL_TABLET | Freq: Every day | ORAL | Status: DC
  Administered 2015-08-23: 300 mg via ORAL
  Filled 2015-08-23 (×2): qty 1

## 2015-08-23 MED ORDER — INSULIN GLARGINE 100 UNIT/ML ~~LOC~~ SOLN
50.0000 [IU] | Freq: Every day | SUBCUTANEOUS | Status: DC
  Administered 2015-08-23 (×2): 50 [IU] via SUBCUTANEOUS
  Filled 2015-08-23 (×4): qty 0.5

## 2015-08-23 MED ORDER — ONDANSETRON HCL 4 MG/2ML IJ SOLN: 4.0000 mg | Freq: Four times a day (QID) | INTRAMUSCULAR | Status: DC | PRN

## 2015-08-23 MED ORDER — ATORVASTATIN CALCIUM 80 MG PO TABS
80.0000 mg | ORAL_TABLET | Freq: Every day | ORAL | Status: DC
  Administered 2015-08-23 – 2015-09-03 (×10): 80 mg via ORAL
  Filled 2015-08-23 (×15): qty 1

## 2015-08-23 MED ORDER — INSULIN ASPART 100 UNIT/ML ~~LOC~~ SOLN
0.0000 [IU] | SUBCUTANEOUS | Status: DC
  Administered 2015-08-23 (×2): 5 [IU] via SUBCUTANEOUS
  Administered 2015-08-23: 3 [IU] via SUBCUTANEOUS
  Administered 2015-08-23: 5 [IU] via SUBCUTANEOUS
  Administered 2015-08-23: 7 [IU] via SUBCUTANEOUS
  Administered 2015-08-24: 3 [IU] via SUBCUTANEOUS
  Administered 2015-08-24: 7 [IU] via SUBCUTANEOUS

## 2015-08-23 MED ORDER — ASPIRIN EC 325 MG PO TBEC: 325.0000 mg | DELAYED_RELEASE_TABLET | Freq: Every day | ORAL | Status: DC

## 2015-08-23 MED ORDER — ACETAMINOPHEN 325 MG PO TABS
650.0000 mg | ORAL_TABLET | Freq: Four times a day (QID) | ORAL | Status: DC | PRN
  Administered 2015-08-23 – 2015-08-25 (×5): 650 mg via ORAL
  Filled 2015-08-23 (×6): qty 2

## 2015-08-23 MED ORDER — HEPARIN (PORCINE) IN NACL 100-0.45 UNIT/ML-% IJ SOLN
1650.0000 [IU]/h | INTRAMUSCULAR | Status: DC
  Administered 2015-08-23: 1350 [IU]/h via INTRAVENOUS
  Administered 2015-08-23: 1550 [IU]/h via INTRAVENOUS
  Administered 2015-08-24 (×2): 1650 [IU]/h via INTRAVENOUS
  Filled 2015-08-23 (×7): qty 250

## 2015-08-23 MED ORDER — ASPIRIN 81 MG PO CHEW
81.0000 mg | CHEWABLE_TABLET | Freq: Every day | ORAL | Status: DC
  Administered 2015-08-24 – 2015-08-28 (×5): 81 mg via ORAL
  Filled 2015-08-23 (×5): qty 1

## 2015-08-23 MED ORDER — SODIUM CHLORIDE 0.9 % IV SOLN
INTRAVENOUS | Status: DC
  Administered 2015-08-23 – 2015-08-24 (×2): via INTRAVENOUS

## 2015-08-23 MED ORDER — ACETAMINOPHEN 650 MG RE SUPP: 650.0000 mg | Freq: Four times a day (QID) | RECTAL | Status: DC | PRN

## 2015-08-23 MED ORDER — ONDANSETRON HCL 4 MG PO TABS: 4.0000 mg | ORAL_TABLET | Freq: Four times a day (QID) | ORAL | Status: DC | PRN

## 2015-08-23 MED ORDER — SODIUM CHLORIDE 0.9 % IV BOLUS (SEPSIS)
500.0000 mL | Freq: Once | INTRAVENOUS | Status: AC
  Administered 2015-08-23: 500 mL via INTRAVENOUS

## 2015-08-23 MED ORDER — CLOPIDOGREL BISULFATE 75 MG PO TABS
300.0000 mg | ORAL_TABLET | Freq: Once | ORAL | Status: AC
  Administered 2015-08-23: 300 mg via ORAL
  Filled 2015-08-23: qty 4

## 2015-08-23 NOTE — Progress Notes (Signed)
Patient seen and examined. Admitted after midnight secondary to CP, typical features and risk factors. Heart score is 5 and has troponin of 5.35. Patient is currently CP free and denies SOB. He is on heparin drip. Cardiology has been re-consulted. 2-D echo pending. Please referred to H&P written by Dr. Paula Compton for further info/details on admission.  [name] [id number]

## 2015-08-23 NOTE — Progress Notes (Signed)
Pt arrived to floor via care link. He is alert, orient, and independent. Vital signs are stable.

## 2015-08-23 NOTE — Progress Notes (Signed)
ANTICOAGULATION CONSULT NOTE - Initial Consult  Pharmacy Consult for Heparin Indication: chest pain/ACS  No Known Allergies  Patient Measurements: Height: 5\' 10"  (177.8 cm) Weight: 263 lb (119.296 kg) IBW/kg (Calculated) : 73 HEPARIN DW (KG): 99.7  Vital Signs: Temp: 97.8 F (36.6 C) (09/09 1950) Temp Source: Oral (09/09 1950) BP: 100/52 mmHg (09/10 0000) Pulse Rate: 96 (09/10 0000)  Labs:  Recent Labs  08/22/15 2030 08/22/15 2324  HGB 14.0  --   HCT 39.4  --   PLT 171  --   CREATININE 1.49*  --   TROPONINI <0.03 0.48*    Estimated Creatinine Clearance: 72.5 mL/min (by C-G formula based on Cr of 1.49).   Medical History: Past Medical History  Diagnosis Date  . HTN (hypertension)   . Diabetes mellitus   . High cholesterol   . Arthritis     Medications:   (Not in a hospital admission)  Home meds reviewed  Assessment: Okay for Protocol, baseline coag labs pending.  Obese male being treated with IV Heparin for chest pain/ACS.  H&P and admission orders pending.  Goal of Therapy:  Heparin level 0.3-0.7 units/ml Monitor platelets by anticoagulation protocol: Yes   Plan:  Give 4000 units bolus x 1 Start heparin infusion at 1350 units/hr Check anti-Xa level in 6-8 hours and daily while on heparin Continue to monitor H&H and platelets  Pricilla Larsson 08/23/2015,1:04 AM

## 2015-08-23 NOTE — Progress Notes (Signed)
CRITICAL VALUE ALERT  Critical value received:  Troponin 5.35  Date of notification:  08/23/15  Time of notification:  0800  Critical value read back:Yes.    Nurse who received alert:  Jabier Mutton  MD notified (1st page):  Dr. Dyann Kief   Time of first page:  0803  MD in at pt bedside; no new order received yet. Will closely monitor. Francis Gaines Demarrio Menges RN.

## 2015-08-23 NOTE — Consult Note (Signed)
Patient ID: [name] MRN: [id number] DOB/AGE: 56/02/1961 56 y.o.  Admit date: 08/22/2015 Primary Physician Purvis Kilts, MD Primary Cardiologist unassigned   Chief Complaint  Chest pain  HPI: [name] is a 54M with DM type 2, hypertension, and hyperlipidemia who presents as a transfer from [facility] with NSTEMI.  Pain started 7:30 on 08/22/15 while at rest.  The chest pain was 8/10 substernal chest pressure that did not radiate.  It was associated with SOB, nausea and diaphoresis.  He took 3 baby aspirin and one of his wife's nitroglycerin tablets, which helped but the pain recurred.  THe pain did not change with exertion.  He decided to present to the [facility] ED.  In the ED his vitals were BP 108/71, HR 105.  ECG showed sinus tachycardia. I nitial troponin was less than assay but the subsequent one was 0.48, so he was transferred to [facility] for management.  Follow up troponin was 5.35.  He has been chest pain free since he was treated in the hospital.  [name] bothers had PCIs in their 86s an his father had PCIs in his 45s and 23s.    Review of Systems:     Cardiac Review of Systems: {Y] = yes [ ]  = no  Chest Pain [ y   ]  Resting SOB [  y ] Exertional SOB  [  ]  Orthopnea [  ]   Pedal Edema [ y  ]    Palpitations [  ] Syncope  [  ]   Presyncope [   ]  General Review of Systems: [Y] = yes [  ]=no Constitional: recent weight change [  ]; anorexia [  ]; fatigue [  ]; nausea [  y]; night sweats [  ]; fever [  ]; or chills [  ];                                                                     Eyes : blurred vision [  ]; diplopia [   ]; vision changes [  ];  Amaurosis fugax[  ]; Resp: cough [  ];  wheezing[  ];  hemoptysis[  ];  PND [  ];  GI:  gallstones[  ], vomiting[  ];  dysphagia[  ]; melena[  ];  hematochezia [  ]; heartburn[  ];   GU: kidney stones [  ]; hematuria[  ];   dysuria [  ];  nocturia[  ]; incontinence [  ];             Skin: rash,  swelling[  ];, hair loss[  ];  peripheral edema[  ];  or itching[  ]; Musculosketetal: myalgias[  ];  joint swelling[  ];  joint erythema[  ];  joint pain[  ];  back pain[  ];  Heme/Lymph: bruising[  ];  bleeding[  ];  anemia[  ];  Neuro: TIA[  ];  headaches[  ];  stroke[  ];  vertigo[  ];  seizures[  ];   paresthesias[  ];  difficulty walking[  ];  Psych:depression[  ]; anxiety[  ];  Endocrine: diabetes[  ];  thyroid dysfunction[  ];  Other:  Past Medical History  Diagnosis Date  . HTN (hypertension)   . Diabetes mellitus   . High cholesterol   . Arthritis     Medications Prior to Admission  Medication Sig Dispense Refill  . aspirin EC 81 MG tablet Take 81 mg by mouth daily.    . hydrochlorothiazide 25 MG tablet Take 12.5 mg by mouth daily.     . insulin lispro protamine-lispro (HUMALOG 75/25 MIX) (75-25) 100 UNIT/ML SUSP injection Inject 70 Units into the skin 2 (two) times daily with a meal.    . KOMBIGLYZE XR 2.04-999 MG TB24 Take 1 tablet by mouth 2 (two) times daily.     [name] loratadine (CLARITIN) 10 MG tablet Take 10 mg by mouth daily.    [name] olmesartan (BENICAR) 40 MG tablet Take 40 mg by mouth daily.    . pravastatin (PRAVACHOL) 20 MG tablet Take 20 mg by mouth at bedtime.     . ranitidine (ZANTAC) 150 MG tablet Take 150 mg by mouth at bedtime.     . traMADol-acetaminophen (ULTRACET) 37.5-325 MG per tablet Take 1 tablet by mouth every 4 (four) hours as needed. (Patient not taking: Reported on 05/05/2015) 90 tablet 5     . aspirin EC  325 mg Oral Daily  . atorvastatin  80 mg Oral q1800  . insulin aspart  0-9 Units Subcutaneous Q4H  . insulin glargine  50 Units Subcutaneous QHS  . irbesartan  300 mg Oral Daily  . nitroGLYCERIN  0.4 mg Transdermal Daily    Infusions: . sodium chloride 125 mL/hr at 08/23/15 0155  . heparin 1,350 Units/hr (08/23/15 0126)    No Known Allergies  Social History   Social History  . Marital Status: Married    Spouse Name: N/A  . Number of  Children: N/A  . Years of Education: college   Occupational History  . maintenance tech    Social History Main Topics  . Smoking status: Never Smoker   . Smokeless tobacco: Not on file  . Alcohol Use: No  . Drug Use: No  . Sexual Activity: Not on file   Other Topics Concern  . Not on file   Social History Narrative    Family History  Problem Relation Age of Onset  . Arthritis    . Cancer    . Diabetes    . Anesthesia problems Neg Hx   . Hypotension Neg Hx   . Malignant hyperthermia Neg Hx   . Pseudochol deficiency Neg Hx   . CAD Father   . Diabetes Mellitus II Father   . CAD Brother   . Diabetes Mellitus II Brother     PHYSICAL EXAM: Filed Vitals:   08/23/15 0912  BP: 110/63  Pulse: 75  Temp:   Resp:      Intake/Output Summary (Last 24 hours) at 08/23/15 1046 Last data filed at 08/23/15 1000  Gross per 24 hour  Intake      0 ml  Output   1050 ml  Net  -1050 ml    General:  Well appearing. No respiratory difficulty HEENT: normal Neck: supple. no JVD. Carotids 2+ bilat; no bruits. No lymphadenopathy or thryomegaly appreciated. Cor: PMI nondisplaced. Regular rate & rhythm. No rubs, gallops or murmurs. Lungs: clear Abdomen: soft, nontender, nondistended. No hepatosplenomegaly. No bruits or masses. Good bowel sounds. Extremities: no cyanosis, clubbing, rash.  Trace edema to the ankles bilaterally. Neuro: alert & oriented x 3, cranial nerves grossly intact. moves all 4 extremities w/o difficulty. Affect pleasant.  Results for orders placed or performed during the hospital encounter of 08/22/15 (from the past 24 hour(s))  Comprehensive metabolic panel     Status: Abnormal   Collection Time: 08/22/15  8:30 PM  Result Value Ref Range   Sodium 136 135 - 145 mmol/L   Potassium 4.2 3.5 - 5.1 mmol/L   Chloride 101 101 - 111 mmol/L   CO2 21 (L) 22 - 32 mmol/L   Glucose, Bld 459 (H) 65 - 99 mg/dL   BUN 22 (H) 6 - 20 mg/dL   Creatinine, Ser 1.49 (H) 0.61 - 1.24  mg/dL   Calcium 8.9 8.9 - 10.3 mg/dL   Total Protein 7.4 6.5 - 8.1 g/dL   Albumin 4.0 3.5 - 5.0 g/dL   AST 59 (H) 15 - 41 U/L   ALT 34 17 - 63 U/L   Alkaline Phosphatase 44 38 - 126 U/L   Total Bilirubin 1.1 0.3 - 1.2 mg/dL   GFR calc non Af Amer 51 (L) >60 mL/min   GFR calc Af Amer 59 (L) >60 mL/min   Anion gap 14 5 - 15  Troponin I     Status: None   Collection Time: 08/22/15  8:30 PM  Result Value Ref Range   Troponin I <0.03 <0.031 ng/mL  CBC with Differential     Status: Abnormal   Collection Time: 08/22/15  8:30 PM  Result Value Ref Range   WBC 9.7 4.0 - 10.5 K/uL   RBC 4.28 4.22 - 5.81 MIL/uL   Hemoglobin 14.0 13.0 - 17.0 g/dL   HCT 39.4 39.0 - 52.0 %   MCV 92.1 78.0 - 100.0 fL   MCH 32.7 26.0 - 34.0 pg   MCHC 35.5 30.0 - 36.0 g/dL   RDW 13.3 11.5 - 15.5 %   Platelets 171 150 - 400 K/uL   Neutrophils Relative % 95 (H) 43 - 77 %   Neutro Abs 9.1 (H) 1.7 - 7.7 K/uL   Lymphocytes Relative 4 (L) 12 - 46 %   Lymphs Abs 0.4 (L) 0.7 - 4.0 K/uL   Monocytes Relative 1 (L) 3 - 12 %   Monocytes Absolute 0.1 0.1 - 1.0 K/uL   Eosinophils Relative 0 0 - 5 %   Eosinophils Absolute 0.0 0.0 - 0.7 K/uL   Basophils Relative 0 0 - 1 %   Basophils Absolute 0.0 0.0 - 0.1 K/uL  Protime-INR     Status: Abnormal   Collection Time: 08/22/15  8:30 PM  Result Value Ref Range   Prothrombin Time 15.3 (H) 11.6 - 15.2 seconds   INR 1.20 0.00 - 1.49  APTT     Status: None   Collection Time: 08/22/15  8:30 PM  Result Value Ref Range   aPTT 30 24 - 37 seconds  CBG monitoring, ED     Status: Abnormal   Collection Time: 08/22/15  8:31 PM  Result Value Ref Range   Glucose-Capillary 461 (H) 65 - 99 mg/dL  Urinalysis, Routine w reflex microscopic (not at [facility])     Status: Abnormal   Collection Time: 08/22/15  8:54 PM  Result Value Ref Range   Color, Urine YELLOW YELLOW   APPearance CLEAR CLEAR   Specific Gravity, Urine 1.010 1.005 - 1.030   pH 5.5 5.0 - 8.0   Glucose, UA >1000 (A) NEGATIVE  mg/dL   Hgb urine dipstick NEGATIVE NEGATIVE   Bilirubin Urine NEGATIVE NEGATIVE   Ketones, ur 15 (A) NEGATIVE mg/dL   Protein, ur  NEGATIVE NEGATIVE mg/dL   Urobilinogen, UA 0.2 0.0 - 1.0 mg/dL   Nitrite NEGATIVE NEGATIVE   Leukocytes, UA NEGATIVE NEGATIVE  Urine microscopic-add on     Status: None   Collection Time: 08/22/15  8:54 PM  Result Value Ref Range   Squamous Epithelial / LPF RARE RARE   WBC, UA 0-2 <3 WBC/hpf   Bacteria, UA RARE RARE   Urine-Other RARE YEAST   Troponin I     Status: Abnormal   Collection Time: 08/22/15 11:24 PM  Result Value Ref Range   Troponin I 0.48 (H) <0.031 ng/mL  CBG monitoring, ED     Status: Abnormal   Collection Time: 08/23/15  3:33 AM  Result Value Ref Range   Glucose-Capillary 356 (H) 65 - 99 mg/dL  Glucose, capillary     Status: Abnormal   Collection Time: 08/23/15  5:21 AM  Result Value Ref Range   Glucose-Capillary 333 (H) 65 - 99 mg/dL  Troponin I (q 6hr x 3)     Status: Abnormal   Collection Time: 08/23/15  5:45 AM  Result Value Ref Range   Troponin I 5.35 (HH) <0.031 ng/mL  Comprehensive metabolic panel     Status: Abnormal   Collection Time: 08/23/15  5:45 AM  Result Value Ref Range   Sodium 138 135 - 145 mmol/L   Potassium 4.5 3.5 - 5.1 mmol/L   Chloride 103 101 - 111 mmol/L   CO2 22 22 - 32 mmol/L   Glucose, Bld 330 (H) 65 - 99 mg/dL   BUN 25 (H) 6 - 20 mg/dL   Creatinine, Ser 1.47 (H) 0.61 - 1.24 mg/dL   Calcium 9.0 8.9 - 10.3 mg/dL   Total Protein 6.2 (L) 6.5 - 8.1 g/dL   Albumin 3.4 (L) 3.5 - 5.0 g/dL   AST 71 (H) 15 - 41 U/L   ALT 29 17 - 63 U/L   Alkaline Phosphatase 37 (L) 38 - 126 U/L   Total Bilirubin 0.7 0.3 - 1.2 mg/dL   GFR calc non Af Amer 52 (L) >60 mL/min   GFR calc Af Amer >60 >60 mL/min   Anion gap 13 5 - 15  CBC WITH DIFFERENTIAL     Status: Abnormal   Collection Time: 08/23/15  5:45 AM  Result Value Ref Range   WBC 7.2 4.0 - 10.5 K/uL   RBC 3.95 (L) 4.22 - 5.81 MIL/uL   Hemoglobin 12.5 (L) 13.0  - 17.0 g/dL   HCT 36.5 (L) 39.0 - 52.0 %   MCV 92.4 78.0 - 100.0 fL   MCH 31.6 26.0 - 34.0 pg   MCHC 34.2 30.0 - 36.0 g/dL   RDW 13.3 11.5 - 15.5 %   Platelets 145 (L) 150 - 400 K/uL   Neutrophils Relative % 84 (H) 43 - 77 %   Neutro Abs 6.0 1.7 - 7.7 K/uL   Lymphocytes Relative 11 (L) 12 - 46 %   Lymphs Abs 0.8 0.7 - 4.0 K/uL   Monocytes Relative 5 3 - 12 %   Monocytes Absolute 0.4 0.1 - 1.0 K/uL   Eosinophils Relative 0 0 - 5 %   Eosinophils Absolute 0.0 0.0 - 0.7 K/uL   Basophils Relative 0 0 - 1 %   Basophils Absolute 0.0 0.0 - 0.1 K/uL  Glucose, capillary     Status: Abnormal   Collection Time: 08/23/15  8:48 AM  Result Value Ref Range   Glucose-Capillary 248 (H) 65 - 99 mg/dL  Dg Chest 2 View  08/22/2015   CLINICAL DATA:  Mid chest pressure, diaphoresis, shortness of breath onset 1.5 hours ago.  EXAM: CHEST  2 VIEW  COMPARISON:  10/19/2013  FINDINGS: Normal heart size and pulmonary vascularity. No focal airspace disease or consolidation in the lungs. No blunting of costophrenic angles. No pneumothorax. Mediastinal contours appear intact. Degenerative changes in the spine.  IMPRESSION: No active cardiopulmonary disease.   Electronically Signed   By: Lucienne Capers M.D.   On: 08/22/2015 21:26      ECG: 08/22/15: Sinus tachycardia at 113 bpm.  Low voltage throughout.  Isolated ST elevation in lead III.  ASSESSMENT/PLAN:  # NSTEMI: Mr. Litterer is currently stable and denies chest pain.  ECG does not show STEMI.  He was loaded with plavix 300mg .  Will not start daily therapy and await cardiac catheterization for a decision on P2Y12. - echo pending - switch aspirin to 81mg  daily - decrease irbesartan to 150mg   - start metoprolol tartrate 12.5mg  bid - agree with switching pravastatin to atorvastatin 80mg  - continue nitroglycerin patch - check fasting lipid panel - NPO after MN Sunday for [facility] Monday   #Hypertension: BP well-controlled, but would like to add a beta blocker  to get his HR down.  WIll decrease irbesartan and start metoprolol as above.  Agree with holding HCTZ.  # Hyperlipidemia: Switched pravastatin to atorvastatin as above.  # DM: Glucose poorly-controlled. Defer management to primary team.    Signed: Skeet Latch P 08/23/2015, 10:46 AM

## 2015-08-23 NOTE — Progress Notes (Signed)
[facility] hospitalist progress note. Chief complaint. Transfer note. History of present illness. This 54 year old male presented to [facility] with complaints of central chest pain/pressure. He also reported diaphoresis and shortness of breath. Patient took one nitroglycerin with minimal relief. He was diagnosed with NSTMI and felt to require transfer to [facility] for further cardiac workup. The patient has arrived in transfer and seeing him at bedside to ensure he remains clinically stable and that his orders transferred appropriately. Patient is currently chest pain-free. Physical exam. Vital signs. Temperature 97.8, pulse 66, respiration 22, blood pressure 107/67. O2 sats 98%. General appearance. Obese elderly male who is alert and in no distress. Cardiac. Rate and rhythm regular. No jugular venous distention. Mild pedal edema. Lungs. Breath sounds clear and equal. Abdomen. Soft and obese with positive bowel sounds. No pain. Impression/plan. Problem #1. NSTMI. Patient has received aspirin and continues on statin and Plavix. He was also initiated on a heparin drip. Patient is currently chest pain-free. Latest troponin elevated 0.48. Cardiology requests daytime attending physician formally consult cardiology during the day shift. Problem #2. Diabetes. Patient will be nothing by mouth currently. Will cover with sliding scale insulin. Problem #3. Hyperlipidemia. Patient continues on statin. Problem #4. Hypertension. Patient continues on home blood pressure medications. Patient appears clinically stable post transfer. All orders appear to have transferred appropriately.

## 2015-08-23 NOTE — Progress Notes (Signed)
ANTICOAGULATION CONSULT NOTE - Follow Up Consult  Pharmacy Consult for Heparin Indication: chest pain/ACS  No Known Allergies  Patient Measurements: Height: 5\' 10"  (177.8 cm) Weight: 261 lb 7.5 oz (118.6 kg) IBW/kg (Calculated) : 73 Heparin Dosing Weight: 99kg  Vital Signs: Temp: 97.6 F (36.4 C) (09/10 1415) Temp Source: Oral (09/10 1415) BP: 98/62 mmHg (09/10 1415) Pulse Rate: 86 (09/10 1415)  Labs:  Recent Labs  08/22/15 2030  08/23/15 0545 08/23/15 1120 08/23/15 1920  HGB 14.0  --  12.5*  --   --   HCT 39.4  --  36.5*  --   --   PLT 171  --  145*  --   --   APTT 30  --   --   --   --   LABPROT 15.3*  --   --   --   --   INR 1.20  --   --   --   --   HEPARINUNFRC  --   --   --  0.23* 0.30  CREATININE 1.49*  --  1.47*  --   --   TROPONINI <0.03  < > 5.35* 6.09* 4.17*  < > = values in this interval not displayed.  Estimated Creatinine Clearance: 73.2 mL/min (by C-G formula based on Cr of 1.47).     Assessment: 53yom started on heparin earlier today for NSTEMI. Initial heparin level is below goal at 0.23. CBC is stable. No bleeding reported. Noted plan for cath on Monday.  PM HL at goal (low end)  Goal of Therapy:  Heparin level 0.3-0.7 units/ml Monitor platelets by anticoagulation protocol: Yes   Plan:  1) Increase  heparin to 1550 units/hr 2) Check 6 hour heparin level  Tad Moore 08/23/2015,8:20 PM

## 2015-08-23 NOTE — ED Notes (Signed)
(  Wife) Elion Mattiello [phone]

## 2015-08-23 NOTE — Progress Notes (Signed)
ANTICOAGULATION CONSULT NOTE - Follow Up Consult  Pharmacy Consult for Heparin Indication: chest pain/ACS  No Known Allergies  Patient Measurements: Height: 5\' 10"  (177.8 cm) Weight: 261 lb 7.5 oz (118.6 kg) IBW/kg (Calculated) : 73 Heparin Dosing Weight: 99kg  Vital Signs: Temp: 97.8 F (36.6 C) (09/10 0447) Temp Source: Oral (09/10 0447) BP: 110/63 mmHg (09/10 0912) Pulse Rate: 75 (09/10 0912)  Labs:  Recent Labs  08/22/15 2030 08/22/15 2324 08/23/15 0545 08/23/15 1120  HGB 14.0  --  12.5*  --   HCT 39.4  --  36.5*  --   PLT 171  --  145*  --   APTT 30  --   --   --   LABPROT 15.3*  --   --   --   INR 1.20  --   --   --   HEPARINUNFRC  --   --   --  0.23*  CREATININE 1.49*  --  1.47*  --   TROPONINI <0.03 0.48* 5.35*  --     Estimated Creatinine Clearance: 73.2 mL/min (by C-G formula based on Cr of 1.47).   Medications:  Heparin @ 1350 units/hr  Assessment: 53yom started on heparin earlier today for NSTEMI. Initial heparin level is below goal at 0.23. CBC is stable. No bleeding reported. Noted plan for cath on Monday.  Goal of Therapy:  Heparin level 0.3-0.7 units/ml Monitor platelets by anticoagulation protocol: Yes   Plan:  1) Continue heparin at 1550 units/hr 2) Check 6 hour heparin level  Deboraha Sprang 08/23/2015,12:07 PM

## 2015-08-23 NOTE — H&P (Addendum)
[facility] History and Physical  [name] [id number] DOB: 1961-01-13 DOA: 08/22/2015  Referring physician: Dr.Glick. PCP: Purvis Kilts, MD  Specialists: None.  Chief Complaint: Chest pain.  HPI: [name] is a 54 y.o. male with history of diabetes mellitus type 2, hypertension, hyperlipidemia and arthritis presents to the ER because of chest pain. Patient's chest pain started last evening around 7:30 PM while he was sitting and watching TV. Patient chest pain as retrosternal pressure-like with mild shortness of breath. Patient called EMS and was brought to the ER. In the ER patient was placed on nitroglycerin patch following which patient's chest pain resolved. Troponin is mildly elevated. EKG was showing nonspecific changes. Patient denies any abdominal pain nausea vomiting productive cough fever or chills. Patient has been admitted for further management of ACS. Presently patient is chest pain-free. Patient patient's blood sugar was found to be markedly elevated and patient states he has had steroids shots yesterday for his left knee and shoulder. He takes insulin and has not missed his dose as per the patient.   Review of Systems: As presented in the history of presenting illness, rest negative.  Past Medical History  Diagnosis Date  . HTN (hypertension)   . Diabetes mellitus   . High cholesterol   . Arthritis    Past Surgical History  Procedure Laterality Date  . Appendectomy    . Right biceps tendon    . Right hand nerve damage    . Left knee arthroscopy    . Knee arthroscopy  left   Social History:  reports that he has never smoked. He does not have any smokeless tobacco history on file. He reports that he does not drink alcohol or use illicit drugs. Where does patient live home. Can patient participate in ADLs? Yes.  No Known Allergies  Family History:  Family History  Problem Relation Age of Onset  . Arthritis    . Cancer    .  Diabetes    . Anesthesia problems Neg Hx   . Hypotension Neg Hx   . Malignant hyperthermia Neg Hx   . Pseudochol deficiency Neg Hx   . CAD Father   . Diabetes Mellitus II Father   . CAD Brother   . Diabetes Mellitus II Brother       Prior to Admission medications   Medication Sig Start Date End Date Taking? Authorizing Provider  aspirin EC 81 MG tablet Take 81 mg by mouth daily.   Yes Historical Provider, MD  hydrochlorothiazide 25 MG tablet Take 12.5 mg by mouth daily.  06/19/11  Yes Historical Provider, MD  insulin lispro protamine-lispro (HUMALOG 75/25 MIX) (75-25) 100 UNIT/ML SUSP injection Inject 70 Units into the skin 2 (two) times daily with a meal.   Yes Historical Provider, MD  KOMBIGLYZE XR 2.04-999 MG TB24 Take 1 tablet by mouth 2 (two) times daily.  07/28/11  Yes Historical Provider, MD  loratadine (CLARITIN) 10 MG tablet Take 10 mg by mouth daily.   Yes Historical Provider, MD  olmesartan (BENICAR) 40 MG tablet Take 40 mg by mouth daily.   Yes Historical Provider, MD  pravastatin (PRAVACHOL) 20 MG tablet Take 20 mg by mouth at bedtime.    Yes Historical Provider, MD  ranitidine (ZANTAC) 150 MG tablet Take 150 mg by mouth at bedtime.  09/13/11  Yes Historical Provider, MD  traMADol-acetaminophen (ULTRACET) 37.5-325 MG per tablet Take 1 tablet by mouth every 4 (four) hours as needed. Patient not taking:  Reported on 05/05/2015 12/18/14   Carole Civil, MD    Physical Exam: Filed Vitals:   08/22/15 2230 08/22/15 2300 08/22/15 2330 08/23/15 0000  BP: 105/60 96/51 107/87 100/52  Pulse: 101 100 98 96  Temp:      TempSrc:      Resp:      Height:      Weight:      SpO2: 97% 97% 98% 96%     General:  Moderately built and nourished.  Eyes: Anicteric no pallor.  ENT: No discharge from the ears eyes nose mouth.  Neck: No mass felt. No JVD appreciated.  Cardiovascular: S1-S2 heard.  Respiratory: No rhonchi or crepitations.  Abdomen: Soft nontender bowel sounds  present.  Skin: No [name].  Musculoskeletal: No edema.  Psychiatric: Appears normal.  Neurologic: Alert awake oriented to time place and person. Moves all extremities.  Labs on Admission:  Basic Metabolic Panel:  Recent Labs Lab 08/22/15 2030  NA 136  K 4.2  CL 101  CO2 21*  GLUCOSE 459*  BUN 22*  CREATININE 1.49*  CALCIUM 8.9   Liver Function Tests:  Recent Labs Lab 08/22/15 2030  AST 59*  ALT 34  ALKPHOS 44  BILITOT 1.1  PROT 7.4  ALBUMIN 4.0   No results for input(s): LIPASE, AMYLASE in the last 168 hours. No results for input(s): AMMONIA in the last 168 hours. CBC:  Recent Labs Lab 08/22/15 2030  WBC 9.7  NEUTROABS 9.1*  HGB 14.0  HCT 39.4  MCV 92.1  PLT 171   Cardiac Enzymes:  Recent Labs Lab 08/22/15 2030 08/22/15 2324  TROPONINI <0.03 0.48*    BNP (last 3 results) No results for input(s): BNP in the last 8760 hours.  ProBNP (last 3 results) No results for input(s): PROBNP in the last 8760 hours.  CBG:  Recent Labs Lab 08/22/15 2031  GLUCAP 461*    Radiological Exams on Admission: Dg Chest 2 View  08/22/2015   CLINICAL DATA:  Mid chest pressure, diaphoresis, shortness of breath onset 1.5 hours ago.  EXAM: CHEST  2 VIEW  COMPARISON:  10/19/2013  FINDINGS: Normal heart size and pulmonary vascularity. No focal airspace disease or consolidation in the lungs. No blunting of costophrenic angles. No pneumothorax. Mediastinal contours appear intact. Degenerative changes in the spine.  IMPRESSION: No active cardiopulmonary disease.   Electronically Signed   By: Lucienne Capers M.D.   On: 08/22/2015 21:26    EKG: Independently reviewed. Sinus tachycardia with nonspecific ST-T changes.   Assessment/Plan Principal Problem:   Non-ST elevation MI (NSTEMI) Active Problems:   Diabetes mellitus type 2, uncontrolled   Hypertension   Hyperlipidemia   1. Non-ST elevation MI - I have discussed with on-call cardiologist at [facility]  Dr. Candyce Churn. Dr. Candyce Churn has advised to give patient Plavix 300 mg by mouth 1 time dose along with which I have started heparin infusion and will be on aspirin and nitroglycerin patch with Lipitor. Patient's blood pressures and low normal to add beta blockers at this time. Will keep patient. Except medications. Check 2-D echo cycle cardiac markers and patient will be transferred [facility] for further management. Further recommendations per cardiologist. Check urine drug screen. 2. Diabetes mellitus type 2 uncontrolled - with possible early DKA. Patient did receive steroids short history probably contributing to elevated blood sugar levels. I have placed patient on 500 mL normal saline bolus followed by infusion. Patient takes NovoLog 70/30 70 units twice daily and  for now I have placed patient on Lantus 50 units subcutaneous 1 dose now and daily. If repeat metabolic panel still shows an angular then may need IV insulin infusion. 3. Renal failure probably acute - continue with hydration and closely follow intake and output and metabolic panel. Follow UA. 4. Hypertension - continue present medications. If creatinine does not improve then may have hold ARB. 5. Hyperlipidemia on statins. 6. Arthritis has received steroids injection for left knee and shoulder yesterday.  I have reviewed patient's charts and personally reviewed x-rays and EKG. I have discussed with on-call cardiologist. Patient's EKG did show some nonspecific changes in the inferior leads which as per the cardiologist is nonspecific. Patient will be transferred to [facility]. Patient is agreeable to transfer. Dr. Alcario Drought will be the accepting physician.   DVT Prophylaxis heparin infusion.  Code Status: Full code.  Family Communication: Discussed with patient.  Disposition Plan: Admit to inpatient.    Jhoselyn Ruffini N. [facility] Pager [phone].  If 7PM-7AM, please contact  night-coverage [email or web address] Password [email or web address] 08/23/2015, 1:09 AM

## 2015-08-24 ENCOUNTER — Inpatient Hospital Stay (HOSPITAL_COMMUNITY): Payer: Federal, State, Local not specified - PPO

## 2015-08-24 DIAGNOSIS — R079 Chest pain, unspecified: Secondary | ICD-10-CM

## 2015-08-24 LAB — CBC
HCT: 36 % — ABNORMAL LOW (ref 39.0–52.0)
Hemoglobin: 12.4 g/dL — ABNORMAL LOW (ref 13.0–17.0)
MCH: 32.5 pg (ref 26.0–34.0)
MCHC: 34.4 g/dL (ref 30.0–36.0)
MCV: 94.2 fL (ref 78.0–100.0)
Platelets: 135 10*3/uL — ABNORMAL LOW (ref 150–400)
RBC: 3.82 MIL/uL — ABNORMAL LOW (ref 4.22–5.81)
RDW: 13.5 % (ref 11.5–15.5)
WBC: 8.8 10*3/uL (ref 4.0–10.5)

## 2015-08-24 LAB — GLUCOSE, CAPILLARY
Glucose-Capillary: 242 mg/dL — ABNORMAL HIGH (ref 65–99)
Glucose-Capillary: 232 mg/dL — ABNORMAL HIGH (ref 65–99)
Glucose-Capillary: 224 mg/dL — ABNORMAL HIGH (ref 65–99)
Glucose-Capillary: 226 mg/dL — ABNORMAL HIGH (ref 65–99)
Glucose-Capillary: 164 mg/dL — ABNORMAL HIGH (ref 65–99)

## 2015-08-24 LAB — HEPARIN LEVEL (UNFRACTIONATED): Heparin Unfractionated: 0.64 IU/mL (ref 0.30–0.70)

## 2015-08-24 MED ORDER — INSULIN GLARGINE 100 UNIT/ML ~~LOC~~ SOLN
27.0000 [IU] | Freq: Two times a day (BID) | SUBCUTANEOUS | Status: DC
Start: 2015-08-24 — End: 2015-08-26
  Administered 2015-08-24 – 2015-08-25 (×4): 27 [IU] via SUBCUTANEOUS
  Filled 2015-08-24 (×5): qty 0.27

## 2015-08-24 MED ORDER — SODIUM CHLORIDE 0.9 % IV SOLN: 250.0000 mL | INTRAVENOUS | Status: DC | PRN

## 2015-08-24 MED ORDER — PERFLUTREN LIPID MICROSPHERE
1.0000 mL | INTRAVENOUS | Status: AC | PRN
  Administered 2015-08-24: 4 mL via INTRAVENOUS
  Filled 2015-08-24: qty 10

## 2015-08-24 MED ORDER — SODIUM CHLORIDE 0.9 % IJ SOLN
3.0000 mL | Freq: Two times a day (BID) | INTRAMUSCULAR | Status: DC
  Administered 2015-08-24 – 2015-08-25 (×2): 3 mL via INTRAVENOUS

## 2015-08-24 MED ORDER — SODIUM CHLORIDE 0.9 % WEIGHT BASED INFUSION
1.0000 mL/kg/h | INTRAVENOUS | Status: DC
Start: 2015-08-24 — End: 2015-08-25
  Administered 2015-08-24 – 2015-08-25 (×3): 1 mL/kg/h via INTRAVENOUS

## 2015-08-24 MED ORDER — INSULIN ASPART 100 UNIT/ML ~~LOC~~ SOLN
0.0000 [IU] | Freq: Three times a day (TID) | SUBCUTANEOUS | Status: DC
  Administered 2015-08-24 (×2): 5 [IU] via SUBCUTANEOUS
  Administered 2015-08-25 – 2015-08-26 (×3): 2 [IU] via SUBCUTANEOUS
  Administered 2015-08-26: 3 [IU] via SUBCUTANEOUS
  Administered 2015-08-27 – 2015-08-28 (×4): 2 [IU] via SUBCUTANEOUS
  Administered 2015-08-28: 3 [IU] via SUBCUTANEOUS

## 2015-08-24 MED ORDER — SODIUM CHLORIDE 0.9 % IJ SOLN: 3.0000 mL | INTRAMUSCULAR | Status: DC | PRN

## 2015-08-24 NOTE — Progress Notes (Signed)
ANTICOAGULATION CONSULT NOTE - Follow Up Consult  Pharmacy Consult for Heparin Indication: chest pain/ACS  No Known Allergies  Patient Measurements: Height: 5\' 10"  (177.8 cm) Weight: 261 lb 7.5 oz (118.6 kg) IBW/kg (Calculated) : 73 Heparin Dosing Weight: 99kg  Vital Signs: Temp: 98.2 F (36.8 C) (09/11 0941) Temp Source: Oral (09/11 0941) BP: 110/62 mmHg (09/11 0941) Pulse Rate: 80 (09/11 0941)  Labs:  Recent Labs  08/22/15 2030  08/23/15 0545 08/23/15 1120 08/23/15 1920 08/24/15 0415  HGB 14.0  --  12.5*  --   --  12.4*  HCT 39.4  --  36.5*  --   --  36.0*  PLT 171  --  145*  --   --  135*  APTT 30  --   --   --   --   --   LABPROT 15.3*  --   --   --   --   --   INR 1.20  --   --   --   --   --   HEPARINUNFRC  --   --   --  0.23* 0.30 0.64  CREATININE 1.49*  --  1.47*  --   --   --   TROPONINI <0.03  < > 5.35* 6.09* 4.17*  --   < > = values in this interval not displayed.  Estimated Creatinine Clearance: 73.2 mL/min (by C-G formula based on Cr of 1.47).   Medications:  Heparin @ 1650 units/hr  Assessment: 53yom continues on heparin for NSTEMI. Heparin level is therapeutic at 0.64. CBC is stable. No bleeding reported. Noted plan for cath on Monday.  Goal of Therapy:  Heparin level 0.3-0.7 units/ml Monitor platelets by anticoagulation protocol: Yes   Plan:  1) Continue heparin at 1650 units/hr 2) Heparin level and CBC in AM 3) Cath tomorrow  Deboraha Sprang 08/24/2015,10:31 AM

## 2015-08-24 NOTE — Progress Notes (Signed)
SUBJECTIVE: The patient is doing well today.  At this time, he denies chest pain, shortness of breath, or any new concerns.  [name] aspirin  81 mg Oral Daily  . atorvastatin  80 mg Oral q1800  . insulin aspart  0-15 Units Subcutaneous TID WC  . insulin glargine  27 Units Subcutaneous BID  . irbesartan  150 mg Oral Daily  . metoprolol tartrate  12.5 mg Oral BID  . nitroGLYCERIN  0.4 mg Transdermal Daily   . sodium chloride 125 mL/hr at 08/23/15 0155  . heparin 1,650 Units/hr (08/24/15 0951)    OBJECTIVE: Physical Exam: Filed Vitals:   08/23/15 1415 08/23/15 2031 08/24/15 0347 08/24/15 0941  BP: 98/62 98/64 97/54  110/62  Pulse: 86 67 71 80  Temp: 97.6 F (36.4 C) 97.4 F (36.3 C) 97.7 F (36.5 C) 98.2 F (36.8 C)  TempSrc: Oral Oral Oral Oral  Resp: 18 18 18 18   Height:      Weight:      SpO2: 100% 97% 96% 98%    Intake/Output Summary (Last 24 hours) at 08/24/15 1157 Last data filed at 08/24/15 0730  Gross per 24 hour  Intake 1621.5 ml  Output   3051 ml  Net -1429.5 ml    Telemetry reveals sinus rhythm  GEN- The patient is well appearing, alert and oriented x 3 today.   Head- normocephalic, atraumatic Eyes-  Sclera clear, conjunctiva pink Ears- hearing intact Oropharynx- clear Neck- supple,   Lungs- Clear to ausculation bilaterally, normal work of breathing Heart- Regular rate and rhythm, no murmurs, rubs or gallops, PMI not laterally displaced GI- soft, NT, ND, + BS Extremities- no clubbing, cyanosis, or edema Skin- no rash or lesion Psych- euthymic mood, full affect Neuro- strength and sensation are intact  LABS: Basic Metabolic Panel:  Recent Labs  08/22/15 2030 08/23/15 0545  NA 136 138  K 4.2 4.5  CL 101 103  CO2 21* 22  GLUCOSE 459* 330*  BUN 22* 25*  CREATININE 1.49* 1.47*  CALCIUM 8.9 9.0   Liver Function Tests:  Recent Labs  08/22/15 2030 08/23/15 0545  AST 59* 71*  ALT 34 29  ALKPHOS 44 37*  BILITOT 1.1 0.7  PROT 7.4 6.2*    ALBUMIN 4.0 3.4*   No results for input(s): LIPASE, AMYLASE in the last 72 hours. CBC:  Recent Labs  08/22/15 2030 08/23/15 0545 08/24/15 0415  WBC 9.7 7.2 8.8  NEUTROABS 9.1* 6.0  --   HGB 14.0 12.5* 12.4*  HCT 39.4 36.5* 36.0*  MCV 92.1 92.4 94.2  PLT 171 145* 135*   Cardiac Enzymes:  Recent Labs  08/23/15 0545 08/23/15 1120 08/23/15 1920  TROPONINI 5.35* 6.09* 4.17*   BNP: Invalid input(s): POCBNP D-Dimer: No results for input(s): DDIMER in the last 72 hours. Hemoglobin A1C: No results for input(s): HGBA1C in the last 72 hours. Fasting Lipid Panel: No results for input(s): CHOL, HDL, LDLCALC, TRIG, CHOLHDL, LDLDIRECT in the last 72 hours. Thyroid Function Tests: No results for input(s): TSH, T4TOTAL, T3FREE, THYROIDAB in the last 72 hours.  Invalid input(s): FREET3 Anemia Panel: No results for input(s): VITAMINB12, FOLATE, FERRITIN, TIBC, IRON, RETICCTPCT in the last 72 hours.  RADIOLOGY: Dg Chest 2 View  08/22/2015   CLINICAL DATA:  Mid chest pressure, diaphoresis, shortness of breath onset 1.5 hours ago.  EXAM: CHEST  2 VIEW  COMPARISON:  10/19/2013  FINDINGS: Normal heart size and pulmonary vascularity. No focal airspace disease or consolidation in the lungs. No  blunting of costophrenic angles. No pneumothorax. Mediastinal contours appear intact. Degenerative changes in the spine.  IMPRESSION: No active cardiopulmonary disease.   Electronically Signed   By: Lucienne Capers M.D.   On: 08/22/2015 21:26    ASSESSMENT AND PLAN:   # NSTEMI: Mr. Remme is currently stable and denies chest pain. ECG does not show STEMI. He was loaded with plavix 300mg . Will not start daily therapy and await cardiac catheterization for a decision on P2Y12. - echo pending - continue aspirin 81mg  daily and metoprolol tartrate 12.5mg  bid - atorvastatin 80mg  - continue nitroglycerin patch - NPO after MN Sunday for [facility] Monday Discussed the cath with the patient. The patient  understands that risks included but are not limited to stroke (1 in 1000), death (1 in 66), kidney failure [usually temporary] (1 in 500), bleeding (1 in 200), allergic reaction [possibly serious] (1 in 200). The patient understands and agrees to proceed.   #Hypertension: Stable No change required today  # Hyperlipidemia: Switched pravastatin to atorvastatin as above.  # DM: Glucose poorly-controlled. Defer management to primary team.   # ARF Gentle hydration prior to cath Avoid hctz going forward   Thompson Grayer, MD 08/24/2015 11:57 AM

## 2015-08-24 NOTE — Progress Notes (Signed)
Utilization review completed.  

## 2015-08-24 NOTE — Progress Notes (Signed)
[facility] PROGRESS NOTE  [name] [id number] DOB: [birth year] DOA: 08/22/2015 PCP: Purvis Kilts, MD  Assessment/Plan: 1-NSTEMI: patient with CP, associated SOB and nausea and troponin up 6.09 -on heparin drip -will continue ASA, b-blocker and statins  -patient is CP free -plan is for cath tomorrow -echo done but pending at this time  -continue NTG patch  2-essential HTN: stable -will continue current antihypertensive regimen  3-AKI: due to use of HCTZ and mild dehydration -will follow renal function trend -continue IVF's  4-diabetes type 2 with hyperglycemia -worsening by ischemia and recent use of steroids -will increase and split lantus dose -SSI changed to moderate -will check A1C  5-HLD: -will continue statins   Code Status: Full Family Communication: no family at bedside; care discussed with patient  Disposition Plan: home when medically stable   Consultants:  Cardiology   Procedures:  LHC planned for 9/12  2-D echo: pending  Antibiotics:  None   HPI/Subjective: Afebrile; denies CP, SOB. Troponin peak at 6.09 and is trending down now  Objective: Filed Vitals:   08/24/15 1358  BP: 109/61  Pulse: 77  Temp: 97.9 F (36.6 C)  Resp: 18    Intake/Output Summary (Last 24 hours) at 08/24/15 1450 Last data filed at 08/24/15 1230  Gross per 24 hour  Intake 1621.5 ml  Output   3053 ml  Net -1431.5 ml   Filed Weights   08/22/15 1950 08/23/15 0447 08/23/15 0452  Weight: 119.296 kg (263 lb) 118.6 kg (261 lb 7.5 oz) 118.6 kg (261 lb 7.5 oz)    Exam:   General:  Afebrile, no CP, no SOB.   Cardiovascular: S1 and S2, no rubs or gallops  Respiratory: good air movement, no wheezing, no crackles  Abdomen: soft, NT, ND, positive BS  Musculoskeletal: no edema, no cyanosis   Data Reviewed: Basic Metabolic Panel:  Recent Labs Lab 08/22/15 2030 08/23/15 0545  NA 136 138  K 4.2 4.5  CL 101 103  CO2 21* 22  GLUCOSE  459* 330*  BUN 22* 25*  CREATININE 1.49* 1.47*  CALCIUM 8.9 9.0   Liver Function Tests:  Recent Labs Lab 08/22/15 2030 08/23/15 0545  AST 59* 71*  ALT 34 29  ALKPHOS 44 37*  BILITOT 1.1 0.7  PROT 7.4 6.2*  ALBUMIN 4.0 3.4*   CBC:  Recent Labs Lab 08/22/15 2030 08/23/15 0545 08/24/15 0415  WBC 9.7 7.2 8.8  NEUTROABS 9.1* 6.0  --   HGB 14.0 12.5* 12.4*  HCT 39.4 36.5* 36.0*  MCV 92.1 92.4 94.2  PLT 171 145* 135*   Cardiac Enzymes:  Recent Labs Lab 08/22/15 2030 08/22/15 2324 08/23/15 0545 08/23/15 1120 08/23/15 1920  TROPONINI <0.03 0.48* 5.35* 6.09* 4.17*   CBG:  Recent Labs Lab 08/23/15 2024 08/23/15 2349 08/24/15 0344 08/24/15 0820 08/24/15 1101  GLUCAP 263* 304* 242* 232* 224*    Studies: Dg Chest 2 View  08/22/2015   CLINICAL DATA:  Mid chest pressure, diaphoresis, shortness of breath onset 1.5 hours ago.  EXAM: CHEST  2 VIEW  COMPARISON:  10/19/2013  FINDINGS: Normal heart size and pulmonary vascularity. No focal airspace disease or consolidation in the lungs. No blunting of costophrenic angles. No pneumothorax. Mediastinal contours appear intact. Degenerative changes in the spine.  IMPRESSION: No active cardiopulmonary disease.   Electronically Signed   By: Lucienne Capers M.D.   On: 08/22/2015 21:26    Scheduled Meds: . aspirin  81 mg Oral Daily  . atorvastatin  80  mg Oral q1800  . insulin aspart  0-15 Units Subcutaneous TID WC  . insulin glargine  27 Units Subcutaneous BID  . irbesartan  150 mg Oral Daily  . metoprolol tartrate  12.5 mg Oral BID  . nitroGLYCERIN  0.4 mg Transdermal Daily   Continuous Infusions: . sodium chloride 125 mL/hr at 08/23/15 0155  . heparin 1,650 Units/hr (08/24/15 0951)    Principal Problem:   Non-ST elevation MI (NSTEMI) Active Problems:   Diabetes mellitus type 2, uncontrolled   Hypertension   Hyperlipidemia   Time spent: 30 minutes   Barton Dubois  [facility] Pager [phone]. If  7PM-7AM, please contact night-coverage at [email or web address], password [facility] 08/24/2015, 2:50 PM  LOS: 1 day

## 2015-08-24 NOTE — Progress Notes (Signed)
  Echocardiogram 2D Echocardiogram has been performed.  Bobbye Charleston 08/24/2015, 3:19 PM

## 2015-08-25 ENCOUNTER — Other Ambulatory Visit: Payer: Self-pay | Admitting: *Deleted

## 2015-08-25 ENCOUNTER — Encounter (HOSPITAL_COMMUNITY)
Admission: EM | Disposition: A | Discharge status: Home Health | Payer: Self-pay | Source: Home / Self Care | Attending: Thoracic Surgery (Cardiothoracic Vascular Surgery)

## 2015-08-25 DIAGNOSIS — R739 Hyperglycemia, unspecified: Secondary | ICD-10-CM

## 2015-08-25 DIAGNOSIS — I2510 Atherosclerotic heart disease of native coronary artery without angina pectoris: Secondary | ICD-10-CM

## 2015-08-25 DIAGNOSIS — I5022 Chronic systolic (congestive) heart failure: Secondary | ICD-10-CM | POA: Insufficient documentation

## 2015-08-25 HISTORY — PX: CARDIAC CATHETERIZATION: SHX172

## 2015-08-25 LAB — BASIC METABOLIC PANEL
Anion gap: 7 (ref 5–15)
BUN: 19 mg/dL (ref 6–20)
CO2: 25 mmol/L (ref 22–32)
Calcium: 8 mg/dL — ABNORMAL LOW (ref 8.9–10.3)
Chloride: 104 mmol/L (ref 101–111)
Creatinine, Ser: 1.21 mg/dL (ref 0.61–1.24)
GFR calc Af Amer: >60 mL/min (ref >60)
GFR calc non Af Amer: >60 mL/min (ref >60)
Glucose, Bld: 141 mg/dL — ABNORMAL HIGH (ref 65–99)
Potassium: 3.7 mmol/L (ref 3.5–5.1)
Sodium: 136 mmol/L (ref 135–145)

## 2015-08-25 LAB — CBC
HCT: 35 % — ABNORMAL LOW (ref 39.0–52.0)
Hemoglobin: 12.1 g/dL — ABNORMAL LOW (ref 13.0–17.0)
MCH: 32.2 pg (ref 26.0–34.0)
MCHC: 34.6 g/dL (ref 30.0–36.0)
MCV: 93.1 fL (ref 78.0–100.0)
Platelets: 118 10*3/uL — ABNORMAL LOW (ref 150–400)
RBC: 3.76 MIL/uL — ABNORMAL LOW (ref 4.22–5.81)
RDW: 13.3 % (ref 11.5–15.5)
WBC: 7.2 10*3/uL (ref 4.0–10.5)

## 2015-08-25 LAB — GLUCOSE, CAPILLARY
Glucose-Capillary: 121 mg/dL — ABNORMAL HIGH (ref 65–99)
Glucose-Capillary: 125 mg/dL — ABNORMAL HIGH (ref 65–99)
Glucose-Capillary: 106 mg/dL — ABNORMAL HIGH (ref 65–99)
Glucose-Capillary: 147 mg/dL — ABNORMAL HIGH (ref 65–99)

## 2015-08-25 LAB — HEMOGLOBIN A1C
Hgb A1c MFr Bld: 9.3 % — ABNORMAL HIGH (ref 4.8–5.6)
Mean Plasma Glucose: 220 mg/dL

## 2015-08-25 LAB — PLATELET INHIBITION P2Y12: Platelet Function  P2Y12: 231 [PRU] (ref 194–418)

## 2015-08-25 LAB — HEPARIN LEVEL (UNFRACTIONATED): Heparin Unfractionated: 0.42 IU/mL (ref 0.30–0.70)

## 2015-08-25 SURGERY — LEFT HEART CATH AND CORONARY ANGIOGRAPHY
Anesthesia: LOCAL

## 2015-08-25 MED ORDER — HEPARIN (PORCINE) IN NACL 100-0.45 UNIT/ML-% IJ SOLN
1800.0000 [IU]/h | INTRAMUSCULAR | Status: DC
  Administered 2015-08-25 – 2015-08-27 (×3): 1650 [IU]/h via INTRAVENOUS
  Administered 2015-08-27 – 2015-08-29 (×3): 1800 [IU]/h via INTRAVENOUS
  Filled 2015-08-25 (×9): qty 250

## 2015-08-25 MED ORDER — ASPIRIN 81 MG PO CHEW
81.0000 mg | CHEWABLE_TABLET | Freq: Every day | ORAL | Status: DC
  Filled 2015-08-25: qty 1

## 2015-08-25 MED ORDER — MIDAZOLAM HCL 2 MG/2ML IJ SOLN
INTRAMUSCULAR | Status: DC | PRN
  Administered 2015-08-25: 1 mg via INTRAVENOUS

## 2015-08-25 MED ORDER — LIDOCAINE HCL (PF) 1 % IJ SOLN
INTRAMUSCULAR | Status: AC
  Filled 2015-08-25: qty 30

## 2015-08-25 MED ORDER — HEPARIN SODIUM (PORCINE) 1000 UNIT/ML IJ SOLN
INTRAMUSCULAR | Status: AC
  Filled 2015-08-25: qty 1

## 2015-08-25 MED ORDER — SODIUM CHLORIDE 0.9 % IV SOLN
INTRAVENOUS | Status: AC
  Administered 2015-08-25: 15:00:00 via INTRAVENOUS

## 2015-08-25 MED ORDER — SODIUM CHLORIDE 0.9 % IJ SOLN: 3.0000 mL | INTRAMUSCULAR | Status: DC | PRN

## 2015-08-25 MED ORDER — IOHEXOL 350 MG/ML SOLN
INTRAVENOUS | Status: DC | PRN
  Administered 2015-08-25: 110 mL via INTRA_ARTERIAL

## 2015-08-25 MED ORDER — LIDOCAINE HCL (PF) 1 % IJ SOLN
INTRAMUSCULAR | Status: DC | PRN
  Administered 2015-08-25: 14:00:00

## 2015-08-25 MED ORDER — HEPARIN SODIUM (PORCINE) 1000 UNIT/ML IJ SOLN
INTRAMUSCULAR | Status: DC | PRN
  Administered 2015-08-25: 6000 [IU] via INTRAVENOUS

## 2015-08-25 MED ORDER — FENTANYL CITRATE (PF) 100 MCG/2ML IJ SOLN
INTRAMUSCULAR | Status: DC | PRN
  Administered 2015-08-25: 50 ug via INTRAVENOUS

## 2015-08-25 MED ORDER — SODIUM CHLORIDE 0.9 % IV SOLN: 250.0000 mL | INTRAVENOUS | Status: DC | PRN

## 2015-08-25 MED ORDER — SODIUM CHLORIDE 0.9 % IJ SOLN
3.0000 mL | Freq: Two times a day (BID) | INTRAMUSCULAR | Status: DC
  Administered 2015-08-25 – 2015-08-27 (×3): 3 mL via INTRAVENOUS

## 2015-08-25 MED ORDER — MIDAZOLAM HCL 2 MG/2ML IJ SOLN
INTRAMUSCULAR | Status: AC
  Filled 2015-08-25: qty 4

## 2015-08-25 MED ORDER — VERAPAMIL HCL 2.5 MG/ML IV SOLN
INTRAVENOUS | Status: DC | PRN
  Administered 2015-08-25: 14:00:00 via INTRA_ARTERIAL

## 2015-08-25 MED ORDER — VERAPAMIL HCL 2.5 MG/ML IV SOLN
INTRAVENOUS | Status: AC
  Filled 2015-08-25: qty 2

## 2015-08-25 MED ORDER — FENTANYL CITRATE (PF) 100 MCG/2ML IJ SOLN
INTRAMUSCULAR | Status: AC
  Filled 2015-08-25: qty 4

## 2015-08-25 SURGICAL SUPPLY — 9 items
CATH INFINITI 5 FR JL3.5 (CATHETERS) ×2 IMPLANT
CATH INFINITI JR4 5F (CATHETERS) ×2 IMPLANT
DEVICE RAD COMP TR BAND LRG (VASCULAR PRODUCTS) ×2 IMPLANT
GLIDESHEATH SLEND A-KIT 6F 22G (SHEATH) ×2 IMPLANT
KIT HEART LEFT (KITS) ×2 IMPLANT
PACK CARDIAC CATHETERIZATION (CUSTOM PROCEDURE TRAY) ×2 IMPLANT
TRANSDUCER W/STOPCOCK (MISCELLANEOUS) ×2 IMPLANT
TUBING CIL FLEX 10 FLL-RA (TUBING) ×2 IMPLANT
WIRE SAFE-T 1.5MM-J .035X260CM (WIRE) ×2 IMPLANT

## 2015-08-25 NOTE — Progress Notes (Signed)
ANTICOAGULATION CONSULT NOTE - Follow Up Consult  Pharmacy Consult for Heparin Indication: chest pain/ACS  No Known Allergies  Patient Measurements: Height: 5\' 10"  (177.8 cm) Weight: 264 lb 15.9 oz (120.2 kg) IBW/kg (Calculated) : 73 Heparin Dosing Weight: 99kg  Vital Signs: Temp: 98 F (36.7 C) (09/12 1300) Temp Source: Oral (09/12 1300) BP: 116/76 mmHg (09/12 1503) Pulse Rate: 74 (09/12 1503)  Labs:  Recent Labs  08/22/15 2030  08/23/15 0545  08/23/15 1120 08/23/15 1920 08/24/15 0415 08/25/15 0225  HGB 14.0  --  12.5*  --   --   --  12.4* 12.1*  HCT 39.4  --  36.5*  --   --   --  36.0* 35.0*  PLT 171  --  145*  --   --   --  135* 118*  APTT 30  --   --   --   --   --   --   --   LABPROT 15.3*  --   --   --   --   --   --   --   INR 1.20  --   --   --   --   --   --   --   HEPARINUNFRC  --   --   --   < > 0.23* 0.30 0.64 0.42  CREATININE 1.49*  --  1.47*  --   --   --   --  1.21  TROPONINI <0.03  < > 5.35*  --  6.09* 4.17*  --   --   < > = values in this interval not displayed.  Estimated Creatinine Clearance: 89.7 mL/min (by C-G formula based on Cr of 1.21).  Assessment: 53yom started on IV heparin and now s/p cath which showed severe CAD with total occlusion of distal RCA. Heparin to resume 8 hours post-sheath removal  Goal of Therapy:  Heparin level 0.3-0.7 units/ml Monitor platelets by anticoagulation protocol: Yes   Plan:  - Restart heparin gtt at 1650 units/hr at 2300 tonight (~8 hours post-sheath removal) - Check an 8 hour heparin level - Daily heparin level and CBC - F/u surgery plans  Salome Arnt, PharmD, BCPS Pager # [phone] 08/25/2015 3:11 PM

## 2015-08-25 NOTE — Progress Notes (Addendum)
Scant oozing noted after 2nd air withdrawal from right radial TR band.  3cc of air replaced, bringing total back up to 10cc.  Pt other wise stable with hand elevated above heart, palpable pulse and <3sec capillary refill.  Will con't to monitor closely.

## 2015-08-25 NOTE — Interval H&P Note (Signed)
Cath Lab Visit (complete for each Cath Lab visit)  Clinical Evaluation Leading to the Procedure:   ACS: Yes.    Non-ACS:    Anginal Classification: CCS III  Anti-ischemic medical therapy: Maximal Therapy (2 or more classes of medications)  Non-Invasive Test Results: No non-invasive testing performed  Prior CABG: No previous CABG      History and Physical Interval Note:  08/25/2015 1:44 PM  [name]  has presented today for surgery, with the diagnosis of unstable angina  The various methods of treatment have been discussed with the patient and family. After consideration of risks, benefits and other options for treatment, the patient has consented to  Procedure(s): Left Heart Cath and Coronary Angiography (N/A) as a surgical intervention .  The patient's history has been reviewed, patient examined, no change in status, stable for surgery.  I have reviewed the patient's chart and labs.  Questions were answered to the patient's satisfaction.     Sinclair Grooms

## 2015-08-25 NOTE — Progress Notes (Signed)
SUBJECTIVE: The patient is doing well today.  At this time, he denies chest pain, shortness of breath, or any new concerns.  Has been walking without symptoms.  [name] aspirin  81 mg Oral Daily  . atorvastatin  80 mg Oral q1800  . insulin aspart  0-15 Units Subcutaneous TID WC  . insulin glargine  27 Units Subcutaneous BID  . irbesartan  150 mg Oral Daily  . metoprolol tartrate  12.5 mg Oral BID  . nitroGLYCERIN  0.4 mg Transdermal Daily  . sodium chloride  3 mL Intravenous Q12H   . sodium chloride Stopped (08/24/15 2023)  . sodium chloride 1 mL/kg/hr (08/25/15 0631)  . heparin 1,650 Units/hr (08/24/15 2303)    OBJECTIVE: Physical Exam: Filed Vitals:   08/24/15 1358 08/24/15 2003 08/25/15 0454 08/25/15 1045  BP: 109/61 109/69 118/74 129/75  Pulse: 77 72 75 69  Temp: 97.9 F (36.6 C) 97.7 F (36.5 C) 97.7 F (36.5 C)   TempSrc: Oral Oral Oral   Resp: 18 18 18    Height:      Weight:   120.2 kg (264 lb 15.9 oz)   SpO2: 97% 98% 97%     Intake/Output Summary (Last 24 hours) at 08/25/15 1116 Last data filed at 08/25/15 0730  Gross per 24 hour  Intake 2244.81 ml  Output   1803 ml  Net 441.81 ml    Telemetry reveals sinus rhythm  GEN- The patient is well appearing, alert and oriented x 3 today.   Head- normocephalic, atraumatic Eyes-  Sclera clear, conjunctiva pink Ears- hearing intact Oropharynx- clear Neck- supple,   Lungs- Clear to ausculation bilaterally, normal work of breathing Heart- Regular rate and rhythm, no murmurs, rubs or gallops, PMI not laterally displaced GI- soft, NT, ND, + BS Extremities- no clubbing, cyanosis, or edema Skin- no rash or lesion Psych- euthymic mood, full affect Neuro- strength and sensation are intact  LABS: Basic Metabolic Panel:  Recent Labs  08/23/15 0545 08/25/15 0225  NA 138 136  K 4.5 3.7  CL 103 104  CO2 22 25  GLUCOSE 330* 141*  BUN 25* 19  CREATININE 1.47* 1.21  CALCIUM 9.0 8.0*   Liver Function  Tests:  Recent Labs  08/22/15 2030 08/23/15 0545  AST 59* 71*  ALT 34 29  ALKPHOS 44 37*  BILITOT 1.1 0.7  PROT 7.4 6.2*  ALBUMIN 4.0 3.4*   No results for input(s): LIPASE, AMYLASE in the last 72 hours. CBC:  Recent Labs  08/22/15 2030 08/23/15 0545 08/24/15 0415 08/25/15 0225  WBC 9.7 7.2 8.8 7.2  NEUTROABS 9.1* 6.0  --   --   HGB 14.0 12.5* 12.4* 12.1*  HCT 39.4 36.5* 36.0* 35.0*  MCV 92.1 92.4 94.2 93.1  PLT 171 145* 135* 118*   Cardiac Enzymes:  Recent Labs  08/23/15 0545 08/23/15 1120 08/23/15 1920  TROPONINI 5.35* 6.09* 4.17*   BNP: Invalid input(s): POCBNP D-Dimer: No results for input(s): DDIMER in the last 72 hours. Hemoglobin A1C:  Recent Labs  08/24/15 1553  HGBA1C 9.3*   Fasting Lipid Panel: No results for input(s): CHOL, HDL, LDLCALC, TRIG, CHOLHDL, LDLDIRECT in the last 72 hours. Thyroid Function Tests: No results for input(s): TSH, T4TOTAL, T3FREE, THYROIDAB in the last 72 hours.  Invalid input(s): FREET3 Anemia Panel: No results for input(s): VITAMINB12, FOLATE, FERRITIN, TIBC, IRON, RETICCTPCT in the last 72 hours.  RADIOLOGY: Dg Chest 2 View  08/22/2015   CLINICAL DATA:  Mid chest pressure, diaphoresis, shortness of  breath onset 1.5 hours ago.  EXAM: CHEST  2 VIEW  COMPARISON:  10/19/2013  FINDINGS: Normal heart size and pulmonary vascularity. No focal airspace disease or consolidation in the lungs. No blunting of costophrenic angles. No pneumothorax. Mediastinal contours appear intact. Degenerative changes in the spine.  IMPRESSION: No active cardiopulmonary disease.   Electronically Signed   By: Lucienne Capers M.D.   On: 08/22/2015 21:26   TTE 08/24/15: Study Conclusions  - Left ventricle: The cavity size was normal. Wall thickness was increased in a pattern of mild LVH. Systolic function was mildly reduced. The estimated ejection fraction was in the range of 45% to 50%. Left ventricular diastolic function parameters  were normal.   ASSESSMENT AND PLAN:   # NSTEMI: Mr. Postiglione is currently stable and denies chest pain. ECG does not show STEMI. He was loaded with plavix 300mg . Will not start daily therapy and await cardiac catheterization for a decision on P2Y12.  Echo with mildly depressed LVEF.  Unable to assess for wall motion abnormality. - continue aspirin 81mg  daily and metoprolol tartrate 12.5mg  bid. WIll likely consolidate to succinate prior to d/c. - atorvastatin 80mg  - continue nitroglycerin patch.  Will switch to imdur or d/c based on cath results. - LHC today Discussed the cath with the patient. The patient understands that risks included but are not limited to stroke (1 in 1000), death (1 in 64), kidney failure [usually temporary] (1 in 500), bleeding (1 in 200), allergic reaction [possibly serious] (1 in 200). The patient understands and agrees to proceed.   #Hypertension: Stable No change required today  # Hyperlipidemia: Switched pravastatin to atorvastatin as above.  # DM: Glucose poorly-controlled. Defer management to primary team.   # ARF: Renal function improved after IV fluids.  Creatinine 1.2 today. Avoid hctz going forward   Sharol Harness, MD 08/25/2015 11:16 AM

## 2015-08-25 NOTE — H&P (View-Only) (Signed)
SUBJECTIVE: The patient is doing well today.  At this time, he denies chest pain, shortness of breath, or any new concerns.  Has been walking without symptoms.  [name] aspirin  81 mg Oral Daily  . atorvastatin  80 mg Oral q1800  . insulin aspart  0-15 Units Subcutaneous TID WC  . insulin glargine  27 Units Subcutaneous BID  . irbesartan  150 mg Oral Daily  . metoprolol tartrate  12.5 mg Oral BID  . nitroGLYCERIN  0.4 mg Transdermal Daily  . sodium chloride  3 mL Intravenous Q12H   . sodium chloride Stopped (08/24/15 2023)  . sodium chloride 1 mL/kg/hr (08/25/15 0631)  . heparin 1,650 Units/hr (08/24/15 2303)    OBJECTIVE: Physical Exam: Filed Vitals:   08/24/15 1358 08/24/15 2003 08/25/15 0454 08/25/15 1045  BP: 109/61 109/69 118/74 129/75  Pulse: 77 72 75 69  Temp: 97.9 F (36.6 C) 97.7 F (36.5 C) 97.7 F (36.5 C)   TempSrc: Oral Oral Oral   Resp: 18 18 18    Height:      Weight:   120.2 kg (264 lb 15.9 oz)   SpO2: 97% 98% 97%     Intake/Output Summary (Last 24 hours) at 08/25/15 1116 Last data filed at 08/25/15 0730  Gross per 24 hour  Intake 2244.81 ml  Output   1803 ml  Net 441.81 ml    Telemetry reveals sinus rhythm  GEN- The patient is well appearing, alert and oriented x 3 today.   Head- normocephalic, atraumatic Eyes-  Sclera clear, conjunctiva pink Ears- hearing intact Oropharynx- clear Neck- supple,   Lungs- Clear to ausculation bilaterally, normal work of breathing Heart- Regular rate and rhythm, no murmurs, rubs or gallops, PMI not laterally displaced GI- soft, NT, ND, + BS Extremities- no clubbing, cyanosis, or edema Skin- no rash or lesion Psych- euthymic mood, full affect Neuro- strength and sensation are intact  LABS: Basic Metabolic Panel:  Recent Labs  08/23/15 0545 08/25/15 0225  NA 138 136  K 4.5 3.7  CL 103 104  CO2 22 25  GLUCOSE 330* 141*  BUN 25* 19  CREATININE 1.47* 1.21  CALCIUM 9.0 8.0*   Liver Function  Tests:  Recent Labs  08/22/15 2030 08/23/15 0545  AST 59* 71*  ALT 34 29  ALKPHOS 44 37*  BILITOT 1.1 0.7  PROT 7.4 6.2*  ALBUMIN 4.0 3.4*   No results for input(s): LIPASE, AMYLASE in the last 72 hours. CBC:  Recent Labs  08/22/15 2030 08/23/15 0545 08/24/15 0415 08/25/15 0225  WBC 9.7 7.2 8.8 7.2  NEUTROABS 9.1* 6.0  --   --   HGB 14.0 12.5* 12.4* 12.1*  HCT 39.4 36.5* 36.0* 35.0*  MCV 92.1 92.4 94.2 93.1  PLT 171 145* 135* 118*   Cardiac Enzymes:  Recent Labs  08/23/15 0545 08/23/15 1120 08/23/15 1920  TROPONINI 5.35* 6.09* 4.17*   BNP: Invalid input(s): POCBNP D-Dimer: No results for input(s): DDIMER in the last 72 hours. Hemoglobin A1C:  Recent Labs  08/24/15 1553  HGBA1C 9.3*   Fasting Lipid Panel: No results for input(s): CHOL, HDL, LDLCALC, TRIG, CHOLHDL, LDLDIRECT in the last 72 hours. Thyroid Function Tests: No results for input(s): TSH, T4TOTAL, T3FREE, THYROIDAB in the last 72 hours.  Invalid input(s): FREET3 Anemia Panel: No results for input(s): VITAMINB12, FOLATE, FERRITIN, TIBC, IRON, RETICCTPCT in the last 72 hours.  RADIOLOGY: Dg Chest 2 View  08/22/2015   CLINICAL DATA:  Mid chest pressure, diaphoresis, shortness of  breath onset 1.5 hours ago.  EXAM: CHEST  2 VIEW  COMPARISON:  10/19/2013  FINDINGS: Normal heart size and pulmonary vascularity. No focal airspace disease or consolidation in the lungs. No blunting of costophrenic angles. No pneumothorax. Mediastinal contours appear intact. Degenerative changes in the spine.  IMPRESSION: No active cardiopulmonary disease.   Electronically Signed   By: Lucienne Capers M.D.   On: 08/22/2015 21:26   TTE 08/24/15: Study Conclusions  - Left ventricle: The cavity size was normal. Wall thickness was increased in a pattern of mild LVH. Systolic function was mildly reduced. The estimated ejection fraction was in the range of 45% to 50%. Left ventricular diastolic function parameters  were normal.   ASSESSMENT AND PLAN:   # NSTEMI: Mr. Lyga is currently stable and denies chest pain. ECG does not show STEMI. He was loaded with plavix 300mg . Will not start daily therapy and await cardiac catheterization for a decision on P2Y12.  Echo with mildly depressed LVEF.  Unable to assess for wall motion abnormality. - continue aspirin 81mg  daily and metoprolol tartrate 12.5mg  bid. WIll likely consolidate to succinate prior to d/c. - atorvastatin 80mg  - continue nitroglycerin patch.  Will switch to imdur or d/c based on cath results. - LHC today Discussed the cath with the patient. The patient understands that risks included but are not limited to stroke (1 in 1000), death (1 in 32), kidney failure [usually temporary] (1 in 500), bleeding (1 in 200), allergic reaction [possibly serious] (1 in 200). The patient understands and agrees to proceed.   #Hypertension: Stable No change required today  # Hyperlipidemia: Switched pravastatin to atorvastatin as above.  # DM: Glucose poorly-controlled. Defer management to primary team.   # ARF: Renal function improved after IV fluids.  Creatinine 1.2 today. Avoid hctz going forward   Sharol Harness, MD 08/25/2015 11:16 AM

## 2015-08-25 NOTE — Progress Notes (Signed)
ANTICOAGULATION CONSULT NOTE - Follow Up Consult  Pharmacy Consult for Heparin Indication: chest pain/ACS  No Known Allergies  Patient Measurements: Height: 5\' 10"  (177.8 cm) Weight: 264 lb 15.9 oz (120.2 kg) IBW/kg (Calculated) : 73 Heparin Dosing Weight: 99kg  Vital Signs: Temp: 97.7 F (36.5 C) (09/12 0454) Temp Source: Oral (09/12 0454) BP: 118/74 mmHg (09/12 0454) Pulse Rate: 75 (09/12 0454)  Labs:  Recent Labs  08/22/15 2030  08/23/15 0545  08/23/15 1120 08/23/15 1920 08/24/15 0415 08/25/15 0225  HGB 14.0  --  12.5*  --   --   --  12.4* 12.1*  HCT 39.4  --  36.5*  --   --   --  36.0* 35.0*  PLT 171  --  145*  --   --   --  135* 118*  APTT 30  --   --   --   --   --   --   --   LABPROT 15.3*  --   --   --   --   --   --   --   INR 1.20  --   --   --   --   --   --   --   HEPARINUNFRC  --   --   --   < > 0.23* 0.30 0.64 0.42  CREATININE 1.49*  --  1.47*  --   --   --   --  1.21  TROPONINI <0.03  < > 5.35*  --  6.09* 4.17*  --   --   < > = values in this interval not displayed.  Estimated Creatinine Clearance: 89.7 mL/min (by C-G formula based on Cr of 1.21).   Medications:  Heparin @ 1650 units/hr  Assessment: 53yom continues on heparin for NSTEMI. Heparin level is therapeutic at 0.42. CBC is stable. No bleeding reported. Noted plan for cath today.  Goal of Therapy:  Heparin level 0.3-0.7 units/ml Monitor platelets by anticoagulation protocol: Yes   Plan:  1) Continue heparin at 1650 units/hr 2) Daily heparin level and CBC 3) F/u cath  Larenda Reedy, Rande Lawman 08/25/2015,7:47 AM

## 2015-08-25 NOTE — Progress Notes (Signed)
[facility] PROGRESS NOTE  [name] [id number] DOB: Nov 11, 1961 DOA: 08/22/2015 PCP: Purvis Kilts, MD  Assessment/Plan: 1-NSTEMI: patient with CP, associated SOB and nausea and troponin peaked at 6.09 -on heparin drip -will continue ASA, b-blocker and statins  -patient has reamined CP free -plan is for cath today -echo inadequate to assess wall motion, mild decrease on EF (45-50%), mild LVH.  -will follow cardiology rec's  2-essential HTN: stable -will continue current antihypertensive regimen  3-AKI: due to use of HCTZ and mild dehydration -resolved with IVF's -will continue holding diuretics -will monitor renal function -Cr 1.27  4-diabetes type 2 with hyperglycemia -worsening by ischemia and recent use of steroids (bilateral cortisone shots to his knees) -CBG's better after lantus dose was changed to BID and dose increased -will continue SSI moderate -A1C 9.3  5-HLD: -will continue statins   6-severe HA: due to use of NTG paste -patient is CP -will d/c NTG -continue tylenol PRN  7-chronic mild systolic HF -will follow I's and O's -daily weight  -encourage low sodium diet -on B-blocker and ARB  Code Status: Full Family Communication: no family at bedside; care discussed with patient  Disposition Plan: home when medically stable; heart cath to be done today at some point    Consultants:  Cardiology   Procedures:  LHC planned for 9/12  2-D echo:  - Left ventricle: The cavity size was normal. Wall thickness was increased in a pattern of mild LVH. Systolic function was mildly reduced. The estimated ejection fraction was in the range of 45% to 50%. Left ventricular diastolic function parameters were normal.  Antibiotics:  None   HPI/Subjective: Troponin peak at 6.09 and is trending down now. No CP and no SOB. Complaining of HA from NTG paste  Objective: Filed Vitals:   08/25/15 1045  BP: 129/75  Pulse: 69  Temp:    Resp:     Intake/Output Summary (Last 24 hours) at 08/25/15 1222 Last data filed at 08/25/15 0730  Gross per 24 hour  Intake 2244.81 ml  Output   1803 ml  Net 441.81 ml   Filed Weights   08/23/15 0447 08/23/15 0452 08/25/15 0454  Weight: 118.6 kg (261 lb 7.5 oz) 118.6 kg (261 lb 7.5 oz) 120.2 kg (264 lb 15.9 oz)    Exam:   General:  Afebrile, no CP, no SOB. Complaining of HA's (most likely from NTG paste). No palpitation, no nausea, no vomiting  Cardiovascular: S1 and S2, no rubs or gallops  Respiratory: good air movement, no wheezing, no crackles  Abdomen: soft, NT, ND, positive BS  Musculoskeletal: no edema, no cyanosis   Data Reviewed: Basic Metabolic Panel:  Recent Labs Lab 08/22/15 2030 08/23/15 0545 08/25/15 0225  NA 136 138 136  K 4.2 4.5 3.7  CL 101 103 104  CO2 21* 22 25  GLUCOSE 459* 330* 141*  BUN 22* 25* 19  CREATININE 1.49* 1.47* 1.21  CALCIUM 8.9 9.0 8.0*   Liver Function Tests:  Recent Labs Lab 08/22/15 2030 08/23/15 0545  AST 59* 71*  ALT 34 29  ALKPHOS 44 37*  BILITOT 1.1 0.7  PROT 7.4 6.2*  ALBUMIN 4.0 3.4*   CBC:  Recent Labs Lab 08/22/15 2030 08/23/15 0545 08/24/15 0415 08/25/15 0225  WBC 9.7 7.2 8.8 7.2  NEUTROABS 9.1* 6.0  --   --   HGB 14.0 12.5* 12.4* 12.1*  HCT 39.4 36.5* 36.0* 35.0*  MCV 92.1 92.4 94.2 93.1  PLT 171 145* 135* 118*  Cardiac Enzymes:  Recent Labs Lab 08/22/15 2030 08/22/15 2324 08/23/15 0545 08/23/15 1120 08/23/15 1920  TROPONINI <0.03 0.48* 5.35* 6.09* 4.17*   CBG:  Recent Labs Lab 08/24/15 1101 08/24/15 1612 08/24/15 2118 08/25/15 0618 08/25/15 1114  GLUCAP 224* 226* 164* 121* 125*    Studies: No results found.  Scheduled Meds: . aspirin  81 mg Oral Daily  . atorvastatin  80 mg Oral q1800  . insulin aspart  0-15 Units Subcutaneous TID WC  . insulin glargine  27 Units Subcutaneous BID  . irbesartan  150 mg Oral Daily  . metoprolol tartrate  12.5 mg Oral BID  .  nitroGLYCERIN  0.4 mg Transdermal Daily  . sodium chloride  3 mL Intravenous Q12H   Continuous Infusions: . sodium chloride Stopped (08/24/15 2023)  . sodium chloride 1 mL/kg/hr (08/25/15 0631)  . heparin 1,650 Units/hr (08/24/15 2303)    Principal Problem:   Non-ST elevation MI (NSTEMI) Active Problems:   Diabetes mellitus type 2, uncontrolled   Hypertension   Hyperlipidemia   Time spent: 30 minutes   Barton Dubois  [facility] Pager [phone]. If 7PM-7AM, please contact night-coverage at [email or web address], password [facility] 08/25/2015, 12:22 PM  LOS: 2 days

## 2015-08-25 NOTE — Progress Notes (Signed)
   08/25/15 1532  Clinical Encounter Type  Visited With Patient;Health care provider  Visit Type Initial;Spiritual support  Referral From Patient  Spiritual Encounters  Spiritual Needs Prayer  Stress Factors  Patient Stress Factors Health changes   Chaplain met with patient and offered prayer for peace and upcoming surgery. Chaplain support available as needed. Pager - [place], [place] 08/25/2015 3:34 PM

## 2015-08-26 ENCOUNTER — Encounter (HOSPITAL_COMMUNITY): Payer: Self-pay | Admitting: Interventional Cardiology

## 2015-08-26 ENCOUNTER — Other Ambulatory Visit: Payer: Self-pay | Admitting: *Deleted

## 2015-08-26 ENCOUNTER — Other Ambulatory Visit (HOSPITAL_COMMUNITY): Payer: Federal, State, Local not specified - PPO

## 2015-08-26 DIAGNOSIS — E876 Hypokalemia: Secondary | ICD-10-CM

## 2015-08-26 DIAGNOSIS — I209 Angina pectoris, unspecified: Secondary | ICD-10-CM

## 2015-08-26 DIAGNOSIS — N179 Acute kidney failure, unspecified: Secondary | ICD-10-CM

## 2015-08-26 DIAGNOSIS — I5022 Chronic systolic (congestive) heart failure: Secondary | ICD-10-CM

## 2015-08-26 DIAGNOSIS — I2510 Atherosclerotic heart disease of native coronary artery without angina pectoris: Secondary | ICD-10-CM

## 2015-08-26 DIAGNOSIS — I25118 Atherosclerotic heart disease of native coronary artery with other forms of angina pectoris: Secondary | ICD-10-CM

## 2015-08-26 DIAGNOSIS — I25110 Atherosclerotic heart disease of native coronary artery with unstable angina pectoris: Secondary | ICD-10-CM

## 2015-08-26 LAB — CBC
HCT: 36.9 % — ABNORMAL LOW (ref 39.0–52.0)
Hemoglobin: 12.6 g/dL — ABNORMAL LOW (ref 13.0–17.0)
MCH: 31.8 pg (ref 26.0–34.0)
MCHC: 34.1 g/dL (ref 30.0–36.0)
MCV: 93.2 fL (ref 78.0–100.0)
Platelets: 126 10*3/uL — ABNORMAL LOW (ref 150–400)
RBC: 3.96 MIL/uL — ABNORMAL LOW (ref 4.22–5.81)
RDW: 13.4 % (ref 11.5–15.5)
WBC: 7.2 10*3/uL (ref 4.0–10.5)

## 2015-08-26 LAB — HEPARIN LEVEL (UNFRACTIONATED): Heparin Unfractionated: 0.41 IU/mL (ref 0.30–0.70)

## 2015-08-26 LAB — BASIC METABOLIC PANEL
Anion gap: 8 (ref 5–15)
BUN: 14 mg/dL (ref 6–20)
CO2: 24 mmol/L (ref 22–32)
Calcium: 7.9 mg/dL — ABNORMAL LOW (ref 8.9–10.3)
Chloride: 105 mmol/L (ref 101–111)
Creatinine, Ser: 1.23 mg/dL (ref 0.61–1.24)
GFR calc Af Amer: >60 mL/min (ref >60)
GFR calc non Af Amer: >60 mL/min (ref >60)
Glucose, Bld: 115 mg/dL — ABNORMAL HIGH (ref 65–99)
Potassium: 3.3 mmol/L — ABNORMAL LOW (ref 3.5–5.1)
Sodium: 137 mmol/L (ref 135–145)

## 2015-08-26 LAB — C DIFFICILE QUICK SCREEN W PCR REFLEX
C Diff antigen: NEGATIVE
C Diff interpretation: NEGATIVE
C Diff toxin: NEGATIVE

## 2015-08-26 LAB — GLUCOSE, CAPILLARY
Glucose-Capillary: 115 mg/dL — ABNORMAL HIGH (ref 65–99)
Glucose-Capillary: 126 mg/dL — ABNORMAL HIGH (ref 65–99)
Glucose-Capillary: 162 mg/dL — ABNORMAL HIGH (ref 65–99)
Glucose-Capillary: 170 mg/dL — ABNORMAL HIGH (ref 65–99)

## 2015-08-26 MED ORDER — POTASSIUM CHLORIDE CRYS ER 20 MEQ PO TBCR
40.0000 meq | EXTENDED_RELEASE_TABLET | ORAL | Status: AC
  Administered 2015-08-26 (×2): 40 meq via ORAL
  Filled 2015-08-26 (×2): qty 2

## 2015-08-26 MED ORDER — INSULIN GLARGINE 100 UNIT/ML ~~LOC~~ SOLN
28.0000 [IU] | Freq: Two times a day (BID) | SUBCUTANEOUS | Status: DC
  Administered 2015-08-26 – 2015-08-28 (×6): 28 [IU] via SUBCUTANEOUS
  Filled 2015-08-26 (×7): qty 0.28

## 2015-08-26 NOTE — Progress Notes (Signed)
CARDIAC REHAB PHASE I   PRE:  Rate/Rhythm: 72 SR  BP:  Supine: 122/70  Sitting:   Standing:    SaO2: 95%RA  MODE:  Ambulation: 400 ft   POST:  Rate/Rhythm: 87  BP:  Supine:   Sitting: 132/78  Standing:    SaO2: 97%RA 1355-1435 Discussed with pt the importance of mobility and IS after surgery. Discussed sternal precautions. Gave pt IS and he can get to 2000 ml. Gave [facility] booklet, care guide and wrote how to view pre op video. Pt stated he has someone to be with him 24/7 after discharge first week. Walked 400 ft with steady gait and no CP. Tolerated well.   Graylon Good, RN BSN  08/26/2015 2:33 PM

## 2015-08-26 NOTE — Consult Note (Signed)
Reason for Consult: 3 vessel CAD s/p NQWMI Referring Physician: Dr. Smith/ Primary Dr. Hilma Favors   HPI: [name] is a 54 year old man who presented with a chief complaint of chest pain.  [name] is a 54 year old man with a past medical history significant for hypertension, hyperlipidemia, insulin-dependent type 2 diabetes, morbid obesity, and a strong family history of coronary disease. He was in his usual state of health until after dinner Friday night (08/22/2015). After eating he developed substernal chest pressure. He initially thought this was due to food "sticking." However, the pain persisted and was associated with shortness of breath, nausea, and mild diaphoresis. He took 3 baby aspirin and one of his wife's nitroglycerin tablets. The pain improved but did not resolve and then it worsened again. He called EMS and was taken to the [facility] emergency room. His EKG showed sinus tachycardia and his initial troponin was negative, but a subsequent troponin was 0.48. His pain resolved with nitroglycerin, but he ruled in for a non-Q-wave MI with a peak troponin of 6.09.   His CBGs was markedly elevated on admission at 459 and his creatinine was elevated at 1.47. Both of those improved with treatment since admission. His hemoglobin A1c was elevated at 9.3.  He had an echocardiogram on 08/24/2015 which showed an ejection fraction of 45-50% and no significant valvular pathology. Yesterday he had cardiac catheterization which revealed severe three-vessel coronary disease.  He has been pain free since admission. He does complain of a headache from the nitroglycerin. He received 1 dose of Plavix, 300 mg on admission.   Past Medical History  Diagnosis Date  . HTN (hypertension)   . Diabetes mellitus   . High cholesterol   . Arthritis     Past Surgical History  Procedure Laterality Date  . Appendectomy    . Right biceps tendon    . Right hand nerve damage    . Left knee arthroscopy     . Knee arthroscopy  left  . Cardiac catheterization N/A 08/25/2015    Procedure: Left Heart Cath and Coronary Angiography;  Surgeon: Belva Crome, MD;  Location: [facility];  Service: Cardiovascular;  Laterality: N/A;    Family History  Problem Relation Age of Onset  . Arthritis    . Cancer    . Diabetes    . Anesthesia problems Neg Hx   . Hypotension Neg Hx   . Malignant hyperthermia Neg Hx   . Pseudochol deficiency Neg Hx   . CAD Father   . Diabetes Mellitus II Father   . CAD Brother   . Diabetes Mellitus II Brother     Social History:  reports that he has never smoked. He does not have any smokeless tobacco history on file. He reports that he does not drink alcohol or use illicit drugs.  Allergies: No Known Allergies  Medications:  Prior to Admission:  Prescriptions prior to admission  Medication Sig Dispense Refill Last Dose  . aspirin EC 81 MG tablet Take 81 mg by mouth daily.   08/22/2015 at Unknown time  . hydrochlorothiazide 25 MG tablet Take 12.5 mg by mouth daily.    08/22/2015 at Unknown time  . insulin lispro protamine-lispro (HUMALOG 75/25 MIX) (75-25) 100 UNIT/ML SUSP injection Inject 70 Units into the skin 2 (two) times daily with a meal.   08/22/2015 at Unknown time  . KOMBIGLYZE XR 2.04-999 MG TB24 Take 1 tablet by mouth 2 (two) times daily.    08/22/2015 at  Unknown time  . loratadine (CLARITIN) 10 MG tablet Take 10 mg by mouth daily.   08/22/2015 at Unknown time  . olmesartan (BENICAR) 40 MG tablet Take 40 mg by mouth daily.   08/22/2015 at Unknown time  . pravastatin (PRAVACHOL) 20 MG tablet Take 20 mg by mouth at bedtime.    08/22/2015 at Unknown time  . ranitidine (ZANTAC) 150 MG tablet Take 150 mg by mouth at bedtime.    08/22/2015 at Unknown time  . traMADol-acetaminophen (ULTRACET) 37.5-325 MG per tablet Take 1 tablet by mouth every 4 (four) hours as needed. (Patient not taking: Reported on 05/05/2015) 90 tablet 5 08/13/2015    Results for orders placed or  performed during the hospital encounter of 08/22/15 (from the past 48 hour(s))  Glucose, capillary     Status: Abnormal   Collection Time: 08/24/15 11:01 AM  Result Value Ref Range   Glucose-Capillary 224 (H) 65 - 99 mg/dL   Comment 1 Notify RN    Comment 2 Document in Chart   Hemoglobin A1c     Status: Abnormal   Collection Time: 08/24/15  3:53 PM  Result Value Ref Range   Hgb A1c MFr Bld 9.3 (H) 4.8 - 5.6 %    Comment: (NOTE)         Pre-diabetes: 5.7 - 6.4         Diabetes: >6.4         Glycemic control for adults with diabetes: <7.0    Mean Plasma Glucose 220 mg/dL    Comment: (NOTE) Performed At: [facility] [address] [place], [place] [id number] Lindon Romp MD CL:[id number]   Glucose, capillary     Status: Abnormal   Collection Time: 08/24/15  4:12 PM  Result Value Ref Range   Glucose-Capillary 226 (H) 65 - 99 mg/dL   Comment 1 Notify RN    Comment 2 Document in Chart   Glucose, capillary     Status: Abnormal   Collection Time: 08/24/15  9:18 PM  Result Value Ref Range   Glucose-Capillary 164 (H) 65 - 99 mg/dL  Heparin level (unfractionated)     Status: None   Collection Time: 08/25/15  2:25 AM  Result Value Ref Range   Heparin Unfractionated 0.42 0.30 - 0.70 IU/mL    Comment:        IF HEPARIN RESULTS ARE BELOW EXPECTED VALUES, AND PATIENT DOSAGE HAS BEEN CONFIRMED, SUGGEST FOLLOW UP TESTING OF ANTITHROMBIN III LEVELS.   CBC     Status: Abnormal   Collection Time: 08/25/15  2:25 AM  Result Value Ref Range   WBC 7.2 4.0 - 10.5 K/uL   RBC 3.76 (L) 4.22 - 5.81 MIL/uL   Hemoglobin 12.1 (L) 13.0 - 17.0 g/dL   HCT 35.0 (L) 39.0 - 52.0 %   MCV 93.1 78.0 - 100.0 fL   MCH 32.2 26.0 - 34.0 pg   MCHC 34.6 30.0 - 36.0 g/dL   RDW 13.3 11.5 - 15.5 %   Platelets 118 (L) 150 - 400 K/uL    Comment: SPECIMEN CHECKED FOR CLOTS REPEATED TO VERIFY PLATELET COUNT CONFIRMED BY SMEAR   Basic metabolic panel     Status: Abnormal   Collection Time: 08/25/15   2:25 AM  Result Value Ref Range   Sodium 136 135 - 145 mmol/L   Potassium 3.7 3.5 - 5.1 mmol/L    Comment: DELTA CHECK NOTED   Chloride 104 101 - 111 mmol/L   CO2 25 22 -  32 mmol/L   Glucose, Bld 141 (H) 65 - 99 mg/dL   BUN 19 6 - 20 mg/dL   Creatinine, Ser 1.21 0.61 - 1.24 mg/dL   Calcium 8.0 (L) 8.9 - 10.3 mg/dL   GFR calc non Af Amer >60 >60 mL/min   GFR calc Af Amer >60 >60 mL/min    Comment: (NOTE) The eGFR has been calculated using the CKD EPI equation. This calculation has not been validated in all clinical situations. eGFR's persistently <60 mL/min signify possible Chronic Kidney Disease.    Anion gap 7 5 - 15  Glucose, capillary     Status: Abnormal   Collection Time: 08/25/15  6:18 AM  Result Value Ref Range   Glucose-Capillary 121 (H) 65 - 99 mg/dL  Glucose, capillary     Status: Abnormal   Collection Time: 08/25/15 11:14 AM  Result Value Ref Range   Glucose-Capillary 125 (H) 65 - 99 mg/dL   Comment 1 Notify RN    Comment 2 Document in Chart   Glucose, capillary     Status: Abnormal   Collection Time: 08/25/15  3:57 PM  Result Value Ref Range   Glucose-Capillary 106 (H) 65 - 99 mg/dL  Platelet inhibition p2y12     Status: None   Collection Time: 08/25/15  4:05 PM  Result Value Ref Range   Platelet Function  P2Y12 231 194 - 418 PRU    Comment:        The literature has shown a direct correlation of PRU values over 230 with higher risks of thrombotic events.  Lower PRU values are associated with platelet inhibition.   Glucose, capillary     Status: Abnormal   Collection Time: 08/25/15 10:13 PM  Result Value Ref Range   Glucose-Capillary 147 (H) 65 - 99 mg/dL   Comment 1 Notify RN    Comment 2 Document in Chart   CBC     Status: Abnormal   Collection Time: 08/26/15  4:27 AM  Result Value Ref Range   WBC 7.2 4.0 - 10.5 K/uL   RBC 3.96 (L) 4.22 - 5.81 MIL/uL   Hemoglobin 12.6 (L) 13.0 - 17.0 g/dL   HCT 36.9 (L) 39.0 - 52.0 %   MCV 93.2 78.0 - 100.0 fL    MCH 31.8 26.0 - 34.0 pg   MCHC 34.1 30.0 - 36.0 g/dL   RDW 13.4 11.5 - 15.5 %   Platelets 126 (L) 150 - 400 K/uL  Basic metabolic panel     Status: Abnormal   Collection Time: 08/26/15  4:27 AM  Result Value Ref Range   Sodium 137 135 - 145 mmol/L   Potassium 3.3 (L) 3.5 - 5.1 mmol/L   Chloride 105 101 - 111 mmol/L   CO2 24 22 - 32 mmol/L   Glucose, Bld 115 (H) 65 - 99 mg/dL   BUN 14 6 - 20 mg/dL   Creatinine, Ser 1.23 0.61 - 1.24 mg/dL   Calcium 7.9 (L) 8.9 - 10.3 mg/dL   GFR calc non Af Amer >60 >60 mL/min   GFR calc Af Amer >60 >60 mL/min    Comment: (NOTE) The eGFR has been calculated using the CKD EPI equation. This calculation has not been validated in all clinical situations. eGFR's persistently <60 mL/min signify possible Chronic Kidney Disease.    Anion gap 8 5 - 15  Glucose, capillary     Status: Abnormal   Collection Time: 08/26/15  6:08 AM  Result Value Ref Range  Glucose-Capillary 115 (H) 65 - 99 mg/dL  Heparin level (unfractionated)     Status: None   Collection Time: 08/26/15  7:19 AM  Result Value Ref Range   Heparin Unfractionated 0.41 0.30 - 0.70 IU/mL    Comment:        IF HEPARIN RESULTS ARE BELOW EXPECTED VALUES, AND PATIENT DOSAGE HAS BEEN CONFIRMED, SUGGEST FOLLOW UP TESTING OF ANTITHROMBIN III LEVELS.     No results found.  Review of Systems  Constitutional: Positive for weight loss (has lost 100 pounds with diet) and malaise/fatigue. Negative for fever, chills and diaphoresis.  Eyes: Negative for blurred vision and double vision.  Respiratory: Positive for shortness of breath. Negative for hemoptysis and wheezing.   Cardiovascular: Positive for chest pain and leg swelling.  Gastrointestinal: Positive for heartburn and nausea. Negative for vomiting and blood in stool.  Genitourinary: Positive for frequency. Negative for dysuria and hematuria.  Musculoskeletal:       Right biceps tendon repair  Neurological: Positive for headaches.  Negative for focal weakness, seizures and loss of consciousness.  Endo/Heme/Allergies: Positive for polydipsia. Does not bruise/bleed easily.  All other systems reviewed and are negative.  Blood pressure 121/75, pulse 78, temperature 98.8 F (37.1 C), temperature source Oral, resp. rate 18, height '5\' 10"'  (1.778 m), weight 261 lb 1.6 oz (118.434 kg), SpO2 94 %. Physical Exam  Vitals reviewed. Constitutional: He is oriented to person, place, and time. No distress.  obese  HENT:  Head: Normocephalic and atraumatic.  Eyes: Conjunctivae and EOM are normal. No scleral icterus.  Neck: Neck supple. No thyromegaly present.  No carotid bruit  Cardiovascular: Normal rate, regular rhythm, normal heart sounds and intact distal pulses.  Exam reveals no gallop.   No murmur heard. Normal Allen's test on left  Respiratory: Effort normal and breath sounds normal. No respiratory distress. He has no wheezes. He has no rales.  GI: Soft. Bowel sounds are normal. He exhibits no distension. There is no tenderness.  Musculoskeletal: He exhibits no edema.  Mild deformity secondary to biceps tendon repair right  Lymphadenopathy:    He has no cervical adenopathy.  Neurological: He is alert and oriented to person, place, and time. No cranial nerve deficit. Coordination normal.  No focal motor deficit  Skin: Skin is warm and dry.  Psychiatric: He has a normal mood and affect.    Assessment/Plan: Mr. Noah is a 54 year old man with multiple cardiac risk factors including hypertension, hyperlipidemia, uncontrolled insulin-dependent type 2 diabetes, obesity, and a strong family history of coronary disease. He presents with a non-Q-wave MI. Cardiac catheterization revealed severe three-vessel coronary disease. Coronary artery bypass grafting is indicated for survival benefit and relief of symptoms.  I have discussed the general nature of CABG, the need for general anesthesia, the use of cardiopulmonary bypass,  and the incisions to be used. We discussed the expected hospital stay, overall recovery and short and long term outcomes. I reviewed the indications, risks, benefits, and alternatives. He understands the risks include, but are not limited to death, stroke, MI, DVT/PE, bleeding, possible need for transfusion, infections, cardiac arrhythmias, and other organ system dysfunction including respiratory, renal, or GI complications. He understands and accepts the risks and agrees to proceed.  Plan CABG on Friday, 08/29/2015.  [name] 08/26/2015, 10:36 AM

## 2015-08-26 NOTE — Progress Notes (Signed)
[facility] PROGRESS NOTE  [name] [id number] DOB: 1961/06/27 DOA: 08/22/2015 PCP: Purvis Kilts, MD  Assessment/Plan: 1-NSTEMI: patient with CP, associated SOB and nausea and troponin peaked at 6.09 -continue heparin drip -will continue ASA, b-blocker and statins  -patient has reamined CP free -cath demonstrated severe 3 vessels disease  -echo inadequate to assess wall motion, mild decrease on EF (45-50%), mild LVH.  -will follow CVTS and cardiology rec's -plan is for CABG on 08/29/15  2-essential HTN: stable -will continue current antihypertensive regimen -BP is stable  3-AKI: due to use of HCTZ and mild dehydration -resolved with IVF's -will continue holding diuretics -will monitor renal function -Cr 1.23  4-diabetes type 2 with hyperglycemia -worsening by ischemia and recent use of steroids (bilateral cortisone shots to his knees) -CBG's better after lantus dose was changed to BID and dose increased -will continue SSI moderate -will need tight control due to plan for CABG; will monitor and adjust insulin therapy accordingly  -A1C 9.3  5-HLD: -will continue statins   6-severe HA: due to use of NTG paste -resolved after NTG discontinued  -continue tylenol PRN  7-chronic mild systolic HF -will follow I's and O's -daily weight  -encourage low sodium diet -on B-blocker and ARB  8-diarrhea: -no abd pain, no nausea, no vomiting and normal WBC's -will check C. Diff  9-hypokalemia: will replete -most likely associated with GI loses   Code Status: Full Family Communication: no family at bedside; care discussed with patient  Disposition Plan: home when medically stable; plan is for CBAG on 08/29/15   Consultants:  Cardiology   Procedures:  LHC planned for 9/12  2-D echo:  - Left ventricle: The cavity size was normal. Wall thickness was increased in a pattern of mild LVH. Systolic function was mildly reduced. The estimated  ejection fraction was in the range of 45% to 50%. Left ventricular diastolic function parameters were normal.  Antibiotics:  None   HPI/Subjective: No CP, no SOB. Patient with severe 3 vessels disease appreciated on heart cath. Continue to be on heparin drip. Reports 3-4 loose stools since last night   Objective: Filed Vitals:   08/26/15 0610  BP: 121/75  Pulse: 78  Temp: 98.8 F (37.1 C)  Resp: 18    Intake/Output Summary (Last 24 hours) at 08/26/15 1133 Last data filed at 08/25/15 1630  Gross per 24 hour  Intake    360 ml  Output   1300 ml  Net   -940 ml   Filed Weights   08/23/15 0452 08/25/15 0454 08/26/15 0610  Weight: 118.6 kg (261 lb 7.5 oz) 120.2 kg (264 lb 15.9 oz) 118.434 kg (261 lb 1.6 oz)    Exam:   General:  Afebrile, no CP, no SOB. No acute distress. No palpitation, no nausea, no vomiting. Reports 3 episodes of loose stools.  Cardiovascular: S1 and S2, no rubs or gallops  Respiratory: good air movement, no wheezing, no crackles  Abdomen: soft, NT, ND, positive BS  Musculoskeletal: no edema, no cyanosis   Data Reviewed: Basic Metabolic Panel:  Recent Labs Lab 08/22/15 2030 08/23/15 0545 08/25/15 0225 08/26/15 0427  NA 136 138 136 137  K 4.2 4.5 3.7 3.3*  CL 101 103 104 105  CO2 21* 22 25 24   GLUCOSE 459* 330* 141* 115*  BUN 22* 25* 19 14  CREATININE 1.49* 1.47* 1.21 1.23  CALCIUM 8.9 9.0 8.0* 7.9*   Liver Function Tests:  Recent Labs Lab 08/22/15 2030 08/23/15 0545  AST 59* 71*  ALT 34 29  ALKPHOS 44 37*  BILITOT 1.1 0.7  PROT 7.4 6.2*  ALBUMIN 4.0 3.4*   CBC:  Recent Labs Lab 08/22/15 2030 08/23/15 0545 08/24/15 0415 08/25/15 0225 08/26/15 0427  WBC 9.7 7.2 8.8 7.2 7.2  NEUTROABS 9.1* 6.0  --   --   --   HGB 14.0 12.5* 12.4* 12.1* 12.6*  HCT 39.4 36.5* 36.0* 35.0* 36.9*  MCV 92.1 92.4 94.2 93.1 93.2  PLT 171 145* 135* 118* 126*   Cardiac Enzymes:  Recent Labs Lab 08/22/15 2030 08/22/15 2324  08/23/15 0545 08/23/15 1120 08/23/15 1920  TROPONINI <0.03 0.48* 5.35* 6.09* 4.17*   CBG:  Recent Labs Lab 08/25/15 1114 08/25/15 1557 08/25/15 2213 08/26/15 0608 08/26/15 1128  GLUCAP 125* 106* 147* 115* 126*    Studies: No results found.  Scheduled Meds: . aspirin  81 mg Oral Daily  . aspirin  81 mg Oral Daily  . atorvastatin  80 mg Oral q1800  . insulin aspart  0-15 Units Subcutaneous TID WC  . insulin glargine  28 Units Subcutaneous BID  . irbesartan  150 mg Oral Daily  . metoprolol tartrate  12.5 mg Oral BID  . nitroGLYCERIN  0.4 mg Transdermal Daily  . potassium chloride  40 mEq Oral Q4H  . sodium chloride  3 mL Intravenous Q12H   Continuous Infusions: . heparin 1,650 Units/hr (08/25/15 2223)    Principal Problem:   Non-ST elevation MI (NSTEMI) Active Problems:   Diabetes mellitus type 2, uncontrolled   Hypertension   Hyperlipidemia   Hyperglycemia   Chronic systolic HF (heart failure)   Time spent: 30 minutes   Barton Dubois  [facility] Pager [phone]. If 7PM-7AM, please contact night-coverage at [email or web address], password [facility] 08/26/2015, 11:33 AM  LOS: 3 days

## 2015-08-26 NOTE — Progress Notes (Signed)
Patient ID: [name] is a 37M with hypertension, diabetes mellitus type 2 (A1c 9.3), hyperlipidemia and morbid obesity here with NSTEMI and newly diagnosed systolic heart failure and 3 vessel CAD, awaiting CABG 08/29/15 with Dr. Roxan Hockey.  Interval history: [name] underwent LHC on 08/25/15 that revealed severe 3 vessel CAD.  He met with the CT Surgery team.  SUBJECTIVE: The patient is doing well today.  At this time, he denies chest pain, shortness of breath, or any new concerns.  Has been walking without symptoms.  [name] aspirin  81 mg Oral Daily  . aspirin  81 mg Oral Daily  . atorvastatin  80 mg Oral q1800  . insulin aspart  0-15 Units Subcutaneous TID WC  . insulin glargine  28 Units Subcutaneous BID  . irbesartan  150 mg Oral Daily  . metoprolol tartrate  12.5 mg Oral BID  . nitroGLYCERIN  0.4 mg Transdermal Daily  . potassium chloride  40 mEq Oral Q4H  . sodium chloride  3 mL Intravenous Q12H   . heparin 1,650 Units/hr (08/25/15 2223)    OBJECTIVE: Physical Exam: Filed Vitals:   08/25/15 2130 08/25/15 2200 08/25/15 2230 08/26/15 0610  BP: 120/74 126/73 124/80 121/75  Pulse: 104 85 89 78  Temp:    98.8 F (37.1 C)  TempSrc:    Oral  Resp:    18  Height:      Weight:    118.434 kg (261 lb 1.6 oz)  SpO2:    94%    Intake/Output Summary (Last 24 hours) at 08/26/15 1153 Last data filed at 08/25/15 1630  Gross per 24 hour  Intake    360 ml  Output   1300 ml  Net   -940 ml    Telemetry reveals sinus rhythm  GEN- The patient is well appearing, alert and oriented x 3 today.   Head- normocephalic, atraumatic Eyes-  Sclera clear, conjunctiva pink Ears- hearing intact Oropharynx- clear Neck- supple,   Lungs- Clear to ausculation bilaterally, normal work of breathing Heart- Regular rate and rhythm, no murmurs, rubs or gallops, PMI not laterally displaced GI- soft, NT, ND, + BS Extremities- no clubbing, cyanosis, or edema.  R radial 2+ and without  ecchymosis or hematoma. Skin- no rash or lesion Psych- euthymic mood, full affect Neuro- strength and sensation are intact  LABS: Basic Metabolic Panel:  Recent Labs  08/25/15 0225 08/26/15 0427  NA 136 137  K 3.7 3.3*  CL 104 105  CO2 25 24  GLUCOSE 141* 115*  BUN 19 14  CREATININE 1.21 1.23  CALCIUM 8.0* 7.9*   Liver Function Tests: No results for input(s): AST, ALT, ALKPHOS, BILITOT, PROT, ALBUMIN in the last 72 hours. No results for input(s): LIPASE, AMYLASE in the last 72 hours. CBC:  Recent Labs  08/25/15 0225 08/26/15 0427  WBC 7.2 7.2  HGB 12.1* 12.6*  HCT 35.0* 36.9*  MCV 93.1 93.2  PLT 118* 126*   Cardiac Enzymes:  Recent Labs  08/23/15 1920  TROPONINI 4.17*   BNP: Invalid input(s): POCBNP D-Dimer: No results for input(s): DDIMER in the last 72 hours. Hemoglobin A1C:  Recent Labs  08/24/15 1553  HGBA1C 9.3*   Fasting Lipid Panel: No results for input(s): CHOL, HDL, LDLCALC, TRIG, CHOLHDL, LDLDIRECT in the last 72 hours. Thyroid Function Tests: No results for input(s): TSH, T4TOTAL, T3FREE, THYROIDAB in the last 72 hours.  Invalid input(s): FREET3 Anemia Panel: No results for input(s): VITAMINB12, FOLATE, FERRITIN, TIBC, IRON, RETICCTPCT in  the last 72 hours.  RADIOLOGY: Dg Chest 2 View  08/22/2015   CLINICAL DATA:  Mid chest pressure, diaphoresis, shortness of breath onset 1.5 hours ago.  EXAM: CHEST  2 VIEW  COMPARISON:  10/19/2013  FINDINGS: Normal heart size and pulmonary vascularity. No focal airspace disease or consolidation in the lungs. No blunting of costophrenic angles. No pneumothorax. Mediastinal contours appear intact. Degenerative changes in the spine.  IMPRESSION: No active cardiopulmonary disease.   Electronically Signed   By: Lucienne Capers M.D.   On: 08/22/2015 21:26   TTE 08/24/15: Study Conclusions  - Left ventricle: The cavity size was normal. Wall thickness was increased in a pattern of mild LVH. Systolic function  was mildly reduced. The estimated ejection fraction was in the range of 45% to 50%. Left ventricular diastolic function parameters were normal.  LHC 08/25/15: 1. Mid RCA to Dist RCA lesion, 100% stenosed. 2. 1st Mrg lesion, 90% stenosed. 3. Ramus lesion, 70% stenosed. 4. Ost Cx to Dist Cx lesion, 60% stenosed. 5. Ost 1st Diag lesion, 90% stenosed. 6. Ost 2nd Diag to 2nd Diag lesion, 70% stenosed. 7. Mid LAD lesion, 90% stenosed. 8. Post Atrio lesion, 90% stenosed. 9. Dist RCA lesion, 100% stenosed.  ASSESSMENT AND PLAN:   # Severe 3 vessel CAD/NSTEMI: [name] is currently stable and denies chest pain. Awaiting CABG on Friday.   He was loaded with plavix 359m on 08/23/15, but has not received any other doses since admission. - continue aspirin 850mdaily and metoprolol tartrate 12.2m52mid. WIll likely consolidate to succinate prior to d/c. - atorvastatin 66m28mcontinue nitroglycerin patch.   - Continue heparin infusion  #Hypertension: Stable - Continue metoprolol   # Hyperlipidemia: Switched pravastatin to atorvastatin as above.  # DM: Glucose poorly-controlled. A1c >9%.  Defer management to primary team.   # ARF: Renal function improved after IV fluids.  Creatinine stable at 1.2 today. Avoid hctz going forward   [name] 08/26/2015 11:53 AM

## 2015-08-26 NOTE — Plan of Care (Signed)
Problem: Phase I Progression Outcomes Goal: Vascular site scale level 0 - I Vascular Site Scale Level 0: No bruising/bleeding/hematoma Level I (Mild): Bruising/Ecchymosis, minimal bleeding/ooozing, palpable hematoma < 3 cm Level II (Moderate): Bleeding not affecting hemodynamic parameters, pseudoaneurysm, palpable hematoma > 3 cm Level III (Severe) Bleeding which affects hemodynamic parameters or retroperitoneal hemorrhage  Outcome: Completed/Met Date Met:  08/26/15 Level 0

## 2015-08-26 NOTE — Progress Notes (Signed)
ANTICOAGULATION CONSULT NOTE - Follow Up Consult  Pharmacy Consult for Heparin Indication: chest pain/ACS  No Known Allergies  Patient Measurements: Height: 5\' 10"  (177.8 cm) Weight: 261 lb 1.6 oz (118.434 kg) IBW/kg (Calculated) : 73 Heparin Dosing Weight: 99kg  Vital Signs: Temp: 98.8 F (37.1 C) (09/13 0610) Temp Source: Oral (09/13 0610) BP: 121/75 mmHg (09/13 0610) Pulse Rate: 78 (09/13 0610)  Labs:  Recent Labs  08/23/15 1120 08/23/15 1920  08/24/15 0415 08/25/15 0225 08/26/15 0427 08/26/15 0719  HGB  --   --   < > 12.4* 12.1* 12.6*  --   HCT  --   --   --  36.0* 35.0* 36.9*  --   PLT  --   --   --  135* 118* 126*  --   HEPARINUNFRC 0.23* 0.30  --  0.64 0.42  --  0.41  CREATININE  --   --   --   --  1.21 1.23  --   TROPONINI 6.09* 4.17*  --   --   --   --   --   < > = values in this interval not displayed.  Estimated Creatinine Clearance: 87.5 mL/min (by C-G formula based on Cr of 1.23).  Assessment: 53yom started on IV heparin and now s/p cath 9/12 which showed severe CAD with total occlusion of distal RCA. Heparin restarted post-cath and heparin level is therapeutic. Awaiting surgical consult.   Goal of Therapy:  Heparin level 0.3-0.7 units/ml Monitor platelets by anticoagulation protocol: Yes   Plan:  - Continue heparin gtt at 1650 units/hr  - Daily heparin level and CBC - F/u surgery plans  Salome Arnt, PharmD, BCPS Pager # [phone] 08/26/2015 8:08 AM

## 2015-08-27 ENCOUNTER — Inpatient Hospital Stay (HOSPITAL_COMMUNITY): Payer: Federal, State, Local not specified - PPO

## 2015-08-27 DIAGNOSIS — R197 Diarrhea, unspecified: Secondary | ICD-10-CM

## 2015-08-27 DIAGNOSIS — Z01810 Encounter for preprocedural cardiovascular examination: Secondary | ICD-10-CM

## 2015-08-27 LAB — CBC
HCT: 37 % — ABNORMAL LOW (ref 39.0–52.0)
Hemoglobin: 13.4 g/dL (ref 13.0–17.0)
MCH: 32.9 pg (ref 26.0–34.0)
MCHC: 36.2 g/dL — ABNORMAL HIGH (ref 30.0–36.0)
MCV: 90.9 fL (ref 78.0–100.0)
Platelets: 120 10*3/uL — ABNORMAL LOW (ref 150–400)
RBC: 4.07 MIL/uL — ABNORMAL LOW (ref 4.22–5.81)
RDW: 13.4 % (ref 11.5–15.5)
WBC: 7.1 10*3/uL (ref 4.0–10.5)

## 2015-08-27 LAB — BASIC METABOLIC PANEL
Anion gap: 10 (ref 5–15)
BUN: 14 mg/dL (ref 6–20)
CO2: 20 mmol/L — ABNORMAL LOW (ref 22–32)
Calcium: 8.5 mg/dL — ABNORMAL LOW (ref 8.9–10.3)
Chloride: 109 mmol/L (ref 101–111)
Creatinine, Ser: 1.19 mg/dL (ref 0.61–1.24)
GFR calc Af Amer: >60 mL/min (ref >60)
GFR calc non Af Amer: >60 mL/min (ref >60)
Glucose, Bld: 133 mg/dL — ABNORMAL HIGH (ref 65–99)
Potassium: 3.5 mmol/L (ref 3.5–5.1)
Sodium: 139 mmol/L (ref 135–145)

## 2015-08-27 LAB — GLUCOSE, CAPILLARY
Glucose-Capillary: 123 mg/dL — ABNORMAL HIGH (ref 65–99)
Glucose-Capillary: 139 mg/dL — ABNORMAL HIGH (ref 65–99)
Glucose-Capillary: 169 mg/dL — ABNORMAL HIGH (ref 65–99)
Glucose-Capillary: 136 mg/dL — ABNORMAL HIGH (ref 65–99)

## 2015-08-27 LAB — MAGNESIUM: Magnesium: 1.9 mg/dL (ref 1.7–2.4)

## 2015-08-27 LAB — HEPARIN LEVEL (UNFRACTIONATED)
Heparin Unfractionated: 0.22 IU/mL — ABNORMAL LOW (ref 0.30–0.70)
Heparin Unfractionated: 0.36 IU/mL (ref 0.30–0.70)

## 2015-08-27 NOTE — Progress Notes (Signed)
Pre-op Cardiac Surgery  Carotid Findings:  Findings suggest 1-39% internal carotid artery stenosis bilaterally.  Upper Extremity Right Left  Brachial Pressures 148-Triphasic 142-Triphasic  Radial Waveforms Triphasic Triphasic  Ulnar Waveforms Triphasic Triphasic  Palmar Arch (Allen's Test) Signal is unaffected with radial compression, obliterates with ulnar compression. Signal decreases <50% with radial compression, obliterates with ulnar compression.   Findings:  Bilateral palpable pedal pulses.  08/27/2015 3:11 PM Maudry Mayhew, RVT, RDCS, RDMS

## 2015-08-27 NOTE — Progress Notes (Signed)
ANTICOAGULATION CONSULT NOTE - Follow Up Consult  Pharmacy Consult for Heparin Indication: chest pain/ACS  No Known Allergies  Patient Measurements: Height: 5\' 10"  (177.8 cm) Weight: 257 lb 15 oz (117 kg) IBW/kg (Calculated) : 73 Heparin Dosing Weight: 99kg  Vital Signs: Temp: 98.9 F (37.2 C) (09/14 1241) Temp Source: Oral (09/14 1241) BP: 123/70 mmHg (09/14 1241) Pulse Rate: 68 (09/14 1241)  Labs:  Recent Labs  08/25/15 0225 08/26/15 0427 08/26/15 0719 08/27/15 0535 08/27/15 1300  HGB 12.1* 12.6*  --  13.4  --   HCT 35.0* 36.9*  --  37.0*  --   PLT 118* 126*  --  120*  --   HEPARINUNFRC 0.42  --  0.41 0.22* 0.36  CREATININE 1.21 1.23  --  1.19  --     Estimated Creatinine Clearance: 89.9 mL/min (by C-G formula based on Cr of 1.19).  Assessment: 53yom started on IV heparin and now s/p cath 9/12 which showed severe CAD with total occlusion of distal RCA. Heparin continues with plan for CABG on 9/16. Heparin level is now therapeutic again after dose increase. No bleeding noted. Platelets remains slightly low.    Goal of Therapy:  Heparin level 0.3-0.7 units/ml Monitor platelets by anticoagulation protocol: Yes   Plan:  - Continue heparin gtt at 1800 units/hr  - Daily heparin level and CBC  Salome Arnt, PharmD, BCPS Pager # [phone] 08/27/2015 1:43 PM

## 2015-08-27 NOTE — Progress Notes (Signed)
ANTICOAGULATION CONSULT NOTE - Follow Up Consult  Pharmacy Consult for heparin Indication: CAD awaiting CABG  Labs:  Recent Labs  08/25/15 0225 08/26/15 0427 08/26/15 0719 08/27/15 0535  HGB 12.1* 12.6*  --   --   HCT 35.0* 36.9*  --   --   PLT 118* 126*  --   --   HEPARINUNFRC 0.42  --  0.41 0.22*  CREATININE 1.21 1.23  --   --      Assessment: 53yo male subtherapeutic on heparin after fairly stable on current rate; no gtt issues per RN.  Goal of Therapy:  Heparin level 0.3-0.7 units/ml   Plan:  Will increase heparin gtt by 1-2 units/kg/hr to 1800 units/hr and check level in 6hr.  Wynona Neat, PharmD, BCPS  08/27/2015,6:12 AM

## 2015-08-27 NOTE — Progress Notes (Signed)
[facility] PROGRESS NOTE  [name] [id number] DOB: 02-01-1961 DOA: 08/22/2015 PCP: Purvis Kilts, MD  Brief interim summary  54 y.o. male with history of diabetes mellitus type 2, hypertension, hyperlipidemia and arthritis presents to the ER because of chest pain. Patient's chest pain started last evening around 7:30 PM while he was sitting and watching TV. Patient chest pain as retrosternal pressure-like with mild shortness of breath. Patient called EMS and was brought to the ER. In the ER patient was placed on nitroglycerin patch following which patient's chest pain resolved. Troponin is mildly elevated. EKG was showing nonspecific changes. Patient denies any abdominal pain nausea vomiting productive cough fever or chills. Patient has been admitted for further management of ACS. His chest pain resolved with nitroglycerin, but he ruled in for a non-Q-wave MI with a peak troponin of 6.09.  Left heart cath positive for severe 3 vessels disease; plan now is for CABG on 08/29/15   Assessment/Plan: 1-NSTEMI: patient with CP, associated SOB and nausea and troponin peaked at 6.09 -continue heparin drip -will continue ASA, b-blocker and statins  -patient has reamined CP free -cath demonstrated severe 3 vessels disease  -echo inadequate to assess wall motion, mild decrease on EF (45-50%), mild LVH.  -will follow CVTS and cardiology rec's -plan is for CABG on 08/29/15  2-essential HTN: stable -will continue current antihypertensive regimen -BP is stable  3-AKI: due to use of HCTZ and mild dehydration -resolved with IVF's -will continue holding diuretics -will monitor renal function -Cr 1.23  4-diabetes type 2 with hyperglycemia -worsening by ischemia and recent use of steroids (bilateral cortisone shots to his knees) -CBG's much better after lantus dose was changed to BID and dose increased -will continue SSI moderate as well -will need tight control due to plan for  CABG; will monitor and adjust insulin therapy accordingly  -A1C 9.3 -in house CBG's < 180  5-HLD: -will continue statins   6-severe HA: due to use of NTG paste -resolved after NTG discontinued  -continue tylenol PRN  7-chronic mild systolic HF -will follow I's and O's -daily weight  -encourage low sodium diet -on B-blocker and ARB  8-diarrhea: -no abd pain, no nausea, no vomiting and normal WBC's -diarrhea now resolved -neg C. Diff  9-hypokalemia: will replete -most likely associated with GI loses  -K after repletion 3.5 and Mg 1.9  Code Status: Full Family Communication: no family at bedside; care discussed with patient  Disposition Plan: home when medically stable; plan is for CBAG on 08/29/15   Consultants:  Cardiology   Procedures:  LHC planned for 9/12  2-D echo:  - Left ventricle: The cavity size was normal. Wall thickness was increased in a pattern of mild LVH. Systolic function was mildly reduced. The estimated ejection fraction was in the range of 45% to 50%. Left ventricular diastolic function parameters were normal.  Antibiotics:  None   HPI/Subjective: No CP, no SOB. Patient in no distress. Reports no further diarrhea  Objective: Filed Vitals:   08/27/15 1058  BP: 122/76  Pulse: 74  Temp:   Resp:     Intake/Output Summary (Last 24 hours) at 08/27/15 1110 Last data filed at 08/27/15 0526  Gross per 24 hour  Intake    612 ml  Output    900 ml  Net   -288 ml   Filed Weights   08/25/15 0454 08/26/15 0610 08/27/15 0519  Weight: 120.2 kg (264 lb 15.9 oz) 118.434 kg (261 lb 1.6 oz) 117  kg (257 lb 15 oz)    Exam:   General:  Afebrile, no CP, no SOB. No acute distress. No further loose stools.  Cardiovascular: S1 and S2, no rubs or gallops  Respiratory: good air movement, no wheezing, no crackles  Abdomen: soft, NT, ND, positive BS  Musculoskeletal: no edema, no cyanosis   Data Reviewed: Basic Metabolic Panel:  Recent  Labs Lab 08/22/15 2030 08/23/15 0545 08/25/15 0225 08/26/15 0427 08/27/15 0535  NA 136 138 136 137 139  K 4.2 4.5 3.7 3.3* 3.5  CL 101 103 104 105 109  CO2 21* 22 25 24  20*  GLUCOSE 459* 330* 141* 115* 133*  BUN 22* 25* 19 14 14   CREATININE 1.49* 1.47* 1.21 1.23 1.19  CALCIUM 8.9 9.0 8.0* 7.9* 8.5*  MG  --   --   --   --  1.9   Liver Function Tests:  Recent Labs Lab 08/22/15 2030 08/23/15 0545  AST 59* 71*  ALT 34 29  ALKPHOS 44 37*  BILITOT 1.1 0.7  PROT 7.4 6.2*  ALBUMIN 4.0 3.4*   CBC:  Recent Labs Lab 08/22/15 2030 08/23/15 0545 08/24/15 0415 08/25/15 0225 08/26/15 0427 08/27/15 0535  WBC 9.7 7.2 8.8 7.2 7.2 7.1  NEUTROABS 9.1* 6.0  --   --   --   --   HGB 14.0 12.5* 12.4* 12.1* 12.6* 13.4  HCT 39.4 36.5* 36.0* 35.0* 36.9* 37.0*  MCV 92.1 92.4 94.2 93.1 93.2 90.9  PLT 171 145* 135* 118* 126* 120*   Cardiac Enzymes:  Recent Labs Lab 08/22/15 2030 08/22/15 2324 08/23/15 0545 08/23/15 1120 08/23/15 1920  TROPONINI <0.03 0.48* 5.35* 6.09* 4.17*   CBG:  Recent Labs Lab 08/26/15 0608 08/26/15 1128 08/26/15 1652 08/26/15 2025 08/27/15 0621  GLUCAP 115* 126* 162* 170* 123*    Studies: No results found.  Scheduled Meds: . aspirin  81 mg Oral Daily  . aspirin  81 mg Oral Daily  . atorvastatin  80 mg Oral q1800  . insulin aspart  0-15 Units Subcutaneous TID WC  . insulin glargine  28 Units Subcutaneous BID  . irbesartan  150 mg Oral Daily  . metoprolol tartrate  12.5 mg Oral BID  . nitroGLYCERIN  0.4 mg Transdermal Daily  . sodium chloride  3 mL Intravenous Q12H   Continuous Infusions: . heparin 1,800 Units/hr (08/27/15 0630)    Principal Problem:   Non-ST elevation MI (NSTEMI) Active Problems:   Diabetes mellitus type 2, uncontrolled   Hypertension   Hyperlipidemia   Hyperglycemia   Chronic systolic HF (heart failure)   Coronary artery disease involving native coronary artery with other forms of angina pectoris   Morbid  obesity   Time spent: 30 minutes   Barton Dubois  [facility] Pager [phone]. If 7PM-7AM, please contact night-coverage at [email or web address], password [facility] 08/27/2015, 11:10 AM  LOS: 4 days

## 2015-08-27 NOTE — Progress Notes (Signed)
CARDIAC REHAB PHASE I   PRE:  Rate/Rhythm: 75 SR  BP:  Supine:   Sitting: 142/89  Standing:    SaO2: 96%RA  MODE:  Ambulation: 550 ft   POST:  Rate/Rhythm: 90 SR  BP:  Supine:   Sitting: 153/84  Standing:    SaO2: 97%RA 0915-0940 Pt walked 550 ft with steady gait. No CP. Tolerated well. In good spirits.   Graylon Good, RN BSN  08/27/2015 9:39 AM

## 2015-08-28 ENCOUNTER — Encounter (HOSPITAL_COMMUNITY): Payer: Federal, State, Local not specified - PPO

## 2015-08-28 DIAGNOSIS — I25110 Atherosclerotic heart disease of native coronary artery with unstable angina pectoris: Secondary | ICD-10-CM

## 2015-08-28 LAB — BLOOD GAS, ARTERIAL
Acid-base deficit: 3.3 mmol/L — ABNORMAL HIGH (ref 0.0–2.0)
Bicarbonate: 20 mEq/L (ref 20.0–24.0)
Drawn by: 345601
O2 Saturation: 94.7 %
Patient temperature: 98.6
TCO2: 21 mmol/L (ref 0–100)
pCO2 arterial: 29.5 mmHg — ABNORMAL LOW (ref 35.0–45.0)
pH, Arterial: 7.447 (ref 7.350–7.450)
pO2, Arterial: 70.6 mmHg — ABNORMAL LOW (ref 80.0–100.0)

## 2015-08-28 LAB — ABO/RH: ABO/RH(D): A POS

## 2015-08-28 LAB — CBC
HCT: 36.4 % — ABNORMAL LOW (ref 39.0–52.0)
Hemoglobin: 12.6 g/dL — ABNORMAL LOW (ref 13.0–17.0)
MCH: 31.8 pg (ref 26.0–34.0)
MCHC: 34.6 g/dL (ref 30.0–36.0)
MCV: 91.9 fL (ref 78.0–100.0)
Platelets: 124 10*3/uL — ABNORMAL LOW (ref 150–400)
RBC: 3.96 MIL/uL — ABNORMAL LOW (ref 4.22–5.81)
RDW: 13.2 % (ref 11.5–15.5)
WBC: 6.5 10*3/uL (ref 4.0–10.5)

## 2015-08-28 LAB — URINALYSIS, ROUTINE W REFLEX MICROSCOPIC
Bilirubin Urine: NEGATIVE
Glucose, UA: NEGATIVE mg/dL
Hgb urine dipstick: NEGATIVE
Ketones, ur: NEGATIVE mg/dL
Leukocytes, UA: NEGATIVE
Nitrite: NEGATIVE
Protein, ur: NEGATIVE mg/dL
Specific Gravity, Urine: 1.01 (ref 1.005–1.030)
Urobilinogen, UA: 1 mg/dL (ref 0.0–1.0)
pH: 6 (ref 5.0–8.0)

## 2015-08-28 LAB — GLUCOSE, CAPILLARY
Glucose-Capillary: 107 mg/dL — ABNORMAL HIGH (ref 65–99)
Glucose-Capillary: 143 mg/dL — ABNORMAL HIGH (ref 65–99)
Glucose-Capillary: 159 mg/dL — ABNORMAL HIGH (ref 65–99)
Glucose-Capillary: 113 mg/dL — ABNORMAL HIGH (ref 65–99)

## 2015-08-28 LAB — PROTIME-INR
INR: 1.18 (ref 0.00–1.49)
Prothrombin Time: 15.2 seconds (ref 11.6–15.2)

## 2015-08-28 LAB — HEPARIN LEVEL (UNFRACTIONATED): Heparin Unfractionated: 0.41 IU/mL (ref 0.30–0.70)

## 2015-08-28 LAB — TYPE AND SCREEN
ABO/RH(D): A POS
Antibody Screen: NEGATIVE

## 2015-08-28 MED ORDER — EPINEPHRINE HCL 1 MG/ML IJ SOLN
0.0000 ug/min | INTRAVENOUS | Status: DC
  Filled 2015-08-28: qty 4

## 2015-08-28 MED ORDER — DEXTROSE 5 % IV SOLN
1.5000 g | INTRAVENOUS | Status: AC
  Administered 2015-08-29: .75 g via INTRAVENOUS
  Administered 2015-08-29: 1.5 g via INTRAVENOUS
  Administered 2015-08-29: 07:00:00 via INTRAVENOUS
  Filled 2015-08-28: qty 1.5

## 2015-08-28 MED ORDER — CHLORHEXIDINE GLUCONATE 4 % EX LIQD
60.0000 mL | Freq: Once | CUTANEOUS | Status: AC
  Administered 2015-08-29: 4 via TOPICAL

## 2015-08-28 MED ORDER — POTASSIUM CHLORIDE 2 MEQ/ML IV SOLN
80.0000 meq | INTRAVENOUS | Status: DC
  Filled 2015-08-28: qty 40

## 2015-08-28 MED ORDER — NITROGLYCERIN IN D5W 200-5 MCG/ML-% IV SOLN
2.0000 ug/min | INTRAVENOUS | Status: AC
  Administered 2015-08-29: 5 ug/min via INTRAVENOUS
  Administered 2015-08-29: 10 ug/min via INTRAVENOUS
  Filled 2015-08-28: qty 250

## 2015-08-28 MED ORDER — CHLORHEXIDINE GLUCONATE 0.12 % MT SOLN
15.0000 mL | Freq: Once | OROMUCOSAL | Status: AC
  Administered 2015-08-29: 15 mL via OROMUCOSAL
  Filled 2015-08-28: qty 15

## 2015-08-28 MED ORDER — TEMAZEPAM 15 MG PO CAPS: 15.0000 mg | ORAL_CAPSULE | Freq: Once | ORAL | Status: DC | PRN

## 2015-08-28 MED ORDER — PLASMA-LYTE 148 IV SOLN
INTRAVENOUS | Status: AC
  Administered 2015-08-29: 500 mL
  Filled 2015-08-28: qty 2.5

## 2015-08-28 MED ORDER — SODIUM CHLORIDE 0.9 % IV SOLN
INTRAVENOUS | Status: AC
  Administered 2015-08-29: 1.2 [IU]/h via INTRAVENOUS
  Filled 2015-08-28: qty 2.5

## 2015-08-28 MED ORDER — DEXMEDETOMIDINE HCL IN NACL 400 MCG/100ML IV SOLN
0.1000 ug/kg/h | INTRAVENOUS | Status: AC
Start: 2015-08-29 — End: 2015-08-29
  Administered 2015-08-29: .2 ug/kg/h via INTRAVENOUS
  Filled 2015-08-28: qty 100

## 2015-08-28 MED ORDER — DEXTROSE 5 % IV SOLN
750.0000 mg | INTRAVENOUS | Status: DC
  Filled 2015-08-28: qty 750

## 2015-08-28 MED ORDER — DIAZEPAM 5 MG PO TABS
5.0000 mg | ORAL_TABLET | Freq: Once | ORAL | Status: AC
  Administered 2015-08-29: 5 mg via ORAL
  Filled 2015-08-28: qty 1

## 2015-08-28 MED ORDER — PHENYLEPHRINE HCL 10 MG/ML IJ SOLN
30.0000 ug/min | INTRAVENOUS | Status: AC
  Administered 2015-08-29: 10 ug/min via INTRAVENOUS
  Filled 2015-08-28: qty 2

## 2015-08-28 MED ORDER — SODIUM CHLORIDE 0.9 % IV SOLN
INTRAVENOUS | Status: AC
  Administered 2015-08-29: 69.8 mL/h via INTRAVENOUS
  Filled 2015-08-28: qty 40

## 2015-08-28 MED ORDER — DOPAMINE-DEXTROSE 3.2-5 MG/ML-% IV SOLN
0.0000 ug/kg/min | INTRAVENOUS | Status: AC
  Administered 2015-08-29: 3 ug/kg/min via INTRAVENOUS
  Filled 2015-08-28: qty 250

## 2015-08-28 MED ORDER — METOPROLOL TARTRATE 12.5 MG HALF TABLET
12.5000 mg | ORAL_TABLET | Freq: Once | ORAL | Status: AC
  Administered 2015-08-29: 12.5 mg via ORAL
  Filled 2015-08-28: qty 1

## 2015-08-28 MED ORDER — BISACODYL 5 MG PO TBEC: 5.0000 mg | DELAYED_RELEASE_TABLET | Freq: Once | ORAL | Status: DC

## 2015-08-28 MED ORDER — ALPRAZOLAM 0.25 MG PO TABS: 0.2500 mg | ORAL_TABLET | ORAL | Status: DC | PRN

## 2015-08-28 MED ORDER — VANCOMYCIN HCL 10 G IV SOLR
1500.0000 mg | INTRAVENOUS | Status: AC
  Administered 2015-08-29: 1500 mg via INTRAVENOUS
  Filled 2015-08-28: qty 1500

## 2015-08-28 MED ORDER — CHLORHEXIDINE GLUCONATE 4 % EX LIQD
60.0000 mL | Freq: Once | CUTANEOUS | Status: AC
  Administered 2015-08-28: 4 via TOPICAL
  Filled 2015-08-28: qty 60

## 2015-08-28 MED ORDER — HEPARIN SODIUM (PORCINE) 1000 UNIT/ML IJ SOLN
INTRAMUSCULAR | Status: DC
  Filled 2015-08-28: qty 30

## 2015-08-28 MED ORDER — MAGNESIUM SULFATE 50 % IJ SOLN
40.0000 meq | INTRAMUSCULAR | Status: DC
  Filled 2015-08-28: qty 10

## 2015-08-28 NOTE — Progress Notes (Signed)
ANTICOAGULATION CONSULT NOTE - Follow Up Consult  Pharmacy Consult for Heparin Indication: chest pain/ACS  No Known Allergies  Patient Measurements: Height: 5\' 10"  (177.8 cm) Weight: 255 lb 1.2 oz (115.7 kg) IBW/kg (Calculated) : 73 Heparin Dosing Weight: 99kg  Vital Signs: Temp: 98.5 F (36.9 C) (09/15 0614) Temp Source: Oral (09/15 0614) BP: 130/72 mmHg (09/15 0614) Pulse Rate: 67 (09/15 0614)  Labs:  Recent Labs  08/26/15 0427  08/27/15 0535 08/27/15 1300 08/28/15 0435  HGB 12.6*  --  13.4  --  12.6*  HCT 36.9*  --  37.0*  --  36.4*  PLT 126*  --  120*  --  124*  HEPARINUNFRC  --   < > 0.22* 0.36 0.41  CREATININE 1.23  --  1.19  --   --   < > = values in this interval not displayed.  Estimated Creatinine Clearance: 89.4 mL/min (by C-G formula based on Cr of 1.19).  Assessment: 53yom started on IV heparin and now s/p cath 9/12 which showed severe CAD with total occlusion of distal RCA. Heparin continues with plan for CABG on 9/16. Heparin remains therapeutic with a level of 0.41. No bleeding noted. H/H + platelets are slightly low. No bleeding noted.    Goal of Therapy:  Heparin level 0.3-0.7 units/ml Monitor platelets by anticoagulation protocol: Yes   Plan:  - Continue heparin gtt at 1800 units/hr  - Daily heparin level and CBC - F/u OR tomorrow  Salome Arnt, PharmD, BCPS Pager # [phone] 08/28/2015 8:40 AM

## 2015-08-28 NOTE — Progress Notes (Signed)
[facility] PROGRESS NOTE  [name] [id number] DOB: 1961-06-23 DOA: 08/22/2015 PCP: Purvis Kilts, MD  Brief interim summary  54 y.o. male with history of diabetes mellitus type 2, hypertension, hyperlipidemia and arthritis presents to the ER because of chest pain. Patient's chest pain started last evening around 7:30 PM while he was sitting and watching TV. Patient chest pain as retrosternal pressure-like with mild shortness of breath. Patient called EMS and was brought to the ER. In the ER patient was placed on nitroglycerin patch following which patient's chest pain resolved. Troponin is mildly elevated. EKG was showing nonspecific changes. Patient denies any abdominal pain nausea vomiting productive cough fever or chills. Patient has been admitted for further management of ACS. His chest pain resolved with nitroglycerin, but he ruled in for a non-Q-wave MI with a peak troponin of 6.09.  Left heart cath positive for severe 3 vessels disease; plan now is for CABG on 08/29/15. Will most likely go to CVTS service after CABG.  Assessment/Plan: 1-NSTEMI: patient with CP, associated SOB and nausea and troponin peaked at 6.09 -continue heparin drip -will continue ASA, b-blocker and statins  -patient has reamined CP free -cath demonstrated severe 3 vessels disease  -echo inadequate to assess wall motion, mild decrease on EF (45-50%), mild LVH.  -will follow CVTS and cardiology rec's -plan is for CABG on 08/29/15  2-essential HTN:  -will continue current antihypertensive regimen -BP is stable and well controlled  3-AKI: due to use of HCTZ and mild dehydration -resolved with IVF's -will continue holding diuretics -will monitor renal function -last Cr 1.19  4-diabetes type 2 with hyperglycemia -worsening by ischemia and recent use of steroids (bilateral cortisone shots to his knees) -CBG's much better after lantus dose was changed to BID and dose increased -will  continue SSI moderate as well -will need tight control due to plan for CABG; will monitor and adjust insulin therapy accordingly  -A1C 9.3 -in house CBG's has continue to be < 180  5-HLD: -will continue statins   6-severe HA: due to use of NTG paste -resolved after NTG discontinued  -continue tylenol PRN  7-chronic mild systolic HF -will follow I's and O's -daily weight  -encourage low sodium diet -on B-blocker and ARB  8-diarrhea: -no abd pain, no nausea, no vomiting and normal WBC's -diarrhea now resolved -neg C. Diff  9-hypokalemia: will replete -most likely associated with GI loses  -K after repletion 3.5 and Mg 1.9 -will monitor and replete as needed   Code Status: Full Family Communication: no family at bedside; care discussed with patient  Disposition Plan: home when medically stable; plan is for CBAG on 08/29/15   Consultants:  Cardiology   Procedures:  LHC planned for 9/12  2-D echo:  - Left ventricle: The cavity size was normal. Wall thickness was increased in a pattern of mild LVH. Systolic function was mildly reduced. The estimated ejection fraction was in the range of 45% to 50%. Left ventricular diastolic function parameters were normal.  Antibiotics:  None   HPI/Subjective: No CP, no SOB. Patient in no distress. Reports no overnight issues. He is mentally prepare and ready for surgery. CBG's continue to be good.  Objective: Filed Vitals:   08/28/15 0614  BP: 130/72  Pulse: 67  Temp: 98.5 F (36.9 C)  Resp: 18   No intake or output data in the 24 hours ending 08/28/15 0955 Filed Weights   08/26/15 0610 08/27/15 0519 08/28/15 0614  Weight: 118.434 kg (261 lb  1.6 oz) 117 kg (257 lb 15 oz) 115.7 kg (255 lb 1.2 oz)    Exam:   General:  Afebrile, no CP, no SOB. No acute distress. Feeling good and ready for surgery he said.  Cardiovascular: S1 and S2, no rubs or gallops  Respiratory: good air movement, no wheezing, no  crackles  Abdomen: soft, NT, ND, positive BS  Musculoskeletal: no edema, no cyanosis   Data Reviewed: Basic Metabolic Panel:  Recent Labs Lab 08/22/15 2030 08/23/15 0545 08/25/15 0225 08/26/15 0427 08/27/15 0535  NA 136 138 136 137 139  K 4.2 4.5 3.7 3.3* 3.5  CL 101 103 104 105 109  CO2 21* 22 25 24  20*  GLUCOSE 459* 330* 141* 115* 133*  BUN 22* 25* 19 14 14   CREATININE 1.49* 1.47* 1.21 1.23 1.19  CALCIUM 8.9 9.0 8.0* 7.9* 8.5*  MG  --   --   --   --  1.9   Liver Function Tests:  Recent Labs Lab 08/22/15 2030 08/23/15 0545  AST 59* 71*  ALT 34 29  ALKPHOS 44 37*  BILITOT 1.1 0.7  PROT 7.4 6.2*  ALBUMIN 4.0 3.4*   CBC:  Recent Labs Lab 08/22/15 2030 08/23/15 0545 08/24/15 0415 08/25/15 0225 08/26/15 0427 08/27/15 0535 08/28/15 0435  WBC 9.7 7.2 8.8 7.2 7.2 7.1 6.5  NEUTROABS 9.1* 6.0  --   --   --   --   --   HGB 14.0 12.5* 12.4* 12.1* 12.6* 13.4 12.6*  HCT 39.4 36.5* 36.0* 35.0* 36.9* 37.0* 36.4*  MCV 92.1 92.4 94.2 93.1 93.2 90.9 91.9  PLT 171 145* 135* 118* 126* 120* 124*   Cardiac Enzymes:  Recent Labs Lab 08/22/15 2030 08/22/15 2324 08/23/15 0545 08/23/15 1120 08/23/15 1920  TROPONINI <0.03 0.48* 5.35* 6.09* 4.17*   CBG:  Recent Labs Lab 08/27/15 0621 08/27/15 1140 08/27/15 1612 08/27/15 2329 08/28/15 0612  GLUCAP 123* 139* 169* 136* 107*    Studies: No results found.  Scheduled Meds: . aspirin  81 mg Oral Daily  . aspirin  81 mg Oral Daily  . atorvastatin  80 mg Oral q1800  . insulin aspart  0-15 Units Subcutaneous TID WC  . insulin glargine  28 Units Subcutaneous BID  . irbesartan  150 mg Oral Daily  . metoprolol tartrate  12.5 mg Oral BID  . nitroGLYCERIN  0.4 mg Transdermal Daily  . sodium chloride  3 mL Intravenous Q12H   Continuous Infusions: . heparin 1,800 Units/hr (08/27/15 1931)    Principal Problem:   Non-ST elevation MI (NSTEMI) Active Problems:   Diabetes mellitus type 2, uncontrolled    Hypertension   Hyperlipidemia   Hyperglycemia   Chronic systolic HF (heart failure)   Coronary artery disease involving native coronary artery with other forms of angina pectoris   Morbid obesity   Time spent: 30 minutes   Barton Dubois  [facility] Pager [phone]. If 7PM-7AM, please contact night-coverage at [email or web address], password [facility] 08/28/2015, 9:55 AM  LOS: 5 days

## 2015-08-28 NOTE — Progress Notes (Signed)
3 Days Post-Op Procedure(s) (LRB): Left Heart Cath and Coronary Angiography (N/A) Subjective: No complaints  Objective: Vital signs in last 24 hours: Temp:  [98.5 F (36.9 C)-98.9 F (37.2 C)] 98.5 F (36.9 C) (09/15 0614) Pulse Rate:  [67-74] 67 (09/15 0614) Cardiac Rhythm:  [-] Sinus bradycardia;Bundle branch block (09/15 0700) Resp:  [18] 18 (09/15 0614) BP: (122-134)/(70-77) 130/72 mmHg (09/15 0614) SpO2:  [97 %-99 %] 97 % (09/15 0614) Weight:  [255 lb 1.2 oz (115.7 kg)] 255 lb 1.2 oz (115.7 kg) (09/15 0614)  Hemodynamic parameters for last 24 hours:    Intake/Output from previous day:   Intake/Output this shift:    General appearance: alert, cooperative and no distress Neurologic: intact Heart: regular rate and rhythm  Lab Results:  Recent Labs  08/27/15 0535 08/28/15 0435  WBC 7.1 6.5  HGB 13.4 12.6*  HCT 37.0* 36.4*  PLT 120* 124*   BMET:  Recent Labs  08/26/15 0427 08/27/15 0535  NA 137 139  K 3.3* 3.5  CL 105 109  CO2 24 20*  GLUCOSE 115* 133*  BUN 14 14  CREATININE 1.23 1.19  CALCIUM 7.9* 8.5*    PT/INR: No results for input(s): LABPROT, INR in the last 72 hours. ABG No results found for: PHART, HCO3, TCO2, ACIDBASEDEF, O2SAT CBG (last 3)   Recent Labs  08/27/15 1612 08/27/15 2329 08/28/15 0612  GLUCAP 169* 136* 107*    Assessment/Plan: S/P Procedure(s) (LRB): Left Heart Cath and Coronary Angiography (N/A) For CABG in AM  Vascular studies OK All questions answered preop orders written   LOS: 5 days    [name] 08/28/2015

## 2015-08-28 NOTE — Progress Notes (Signed)
CARDIAC REHAB PHASE I   PRE:  Rate/Rhythm: 71 SR  BP:  Supine:   Sitting: 140/81  Standing:    SaO2: 99%RA  MODE:  Ambulation: 690 ft   POST:  Rate/Rhythm: 74 SR  BP:  Supine:   Sitting: 141/77  Standing:    SaO2: 100%RA 0945-1006 Pt walked 690 ft with steady gait. No CP. Tolerated well. Will see after surgery.   Graylon Good, RN BSN  08/28/2015 10:04 AM

## 2015-08-29 ENCOUNTER — Encounter (HOSPITAL_COMMUNITY)
Admission: EM | Disposition: A | Discharge status: Home Health | Payer: Federal, State, Local not specified - PPO | Source: Home / Self Care | Attending: Thoracic Surgery (Cardiothoracic Vascular Surgery)

## 2015-08-29 ENCOUNTER — Inpatient Hospital Stay (HOSPITAL_COMMUNITY): Payer: Federal, State, Local not specified - PPO

## 2015-08-29 ENCOUNTER — Inpatient Hospital Stay (HOSPITAL_COMMUNITY): Payer: Federal, State, Local not specified - PPO | Admitting: Anesthesiology

## 2015-08-29 DIAGNOSIS — Z951 Presence of aortocoronary bypass graft: Secondary | ICD-10-CM

## 2015-08-29 HISTORY — PX: TEE WITHOUT CARDIOVERSION: SHX5443

## 2015-08-29 HISTORY — PX: CORONARY ARTERY BYPASS GRAFT: SHX141

## 2015-08-29 LAB — POCT I-STAT, CHEM 8
BUN: 11 mg/dL (ref 6–20)
BUN: 10 mg/dL (ref 6–20)
BUN: 10 mg/dL (ref 6–20)
BUN: 10 mg/dL (ref 6–20)
BUN: 10 mg/dL (ref 6–20)
BUN: 10 mg/dL (ref 6–20)
Calcium, Ion: 1.2 mmol/L (ref 1.12–1.23)
Calcium, Ion: 1.01 mmol/L — ABNORMAL LOW (ref 1.12–1.23)
Calcium, Ion: 1.18 mmol/L (ref 1.12–1.23)
Calcium, Ion: 1.02 mmol/L — ABNORMAL LOW (ref 1.12–1.23)
Calcium, Ion: 1.06 mmol/L — ABNORMAL LOW (ref 1.12–1.23)
Calcium, Ion: 1.12 mmol/L (ref 1.12–1.23)
Chloride: 104 mmol/L (ref 101–111)
Chloride: 105 mmol/L (ref 101–111)
Chloride: 98 mmol/L — ABNORMAL LOW (ref 101–111)
Chloride: 102 mmol/L (ref 101–111)
Chloride: 103 mmol/L (ref 101–111)
Chloride: 108 mmol/L (ref 101–111)
Creatinine, Ser: 0.9 mg/dL (ref 0.61–1.24)
Creatinine, Ser: 0.9 mg/dL (ref 0.61–1.24)
Creatinine, Ser: 0.9 mg/dL (ref 0.61–1.24)
Creatinine, Ser: 0.9 mg/dL (ref 0.61–1.24)
Creatinine, Ser: 0.9 mg/dL (ref 0.61–1.24)
Creatinine, Ser: 1 mg/dL (ref 0.61–1.24)
Glucose, Bld: 124 mg/dL — ABNORMAL HIGH (ref 65–99)
Glucose, Bld: 129 mg/dL — ABNORMAL HIGH (ref 65–99)
Glucose, Bld: 131 mg/dL — ABNORMAL HIGH (ref 65–99)
Glucose, Bld: 138 mg/dL — ABNORMAL HIGH (ref 65–99)
Glucose, Bld: 200 mg/dL — ABNORMAL HIGH (ref 65–99)
Glucose, Bld: 116 mg/dL — ABNORMAL HIGH (ref 65–99)
HCT: 35 % — ABNORMAL LOW (ref 39.0–52.0)
HCT: 26 % — ABNORMAL LOW (ref 39.0–52.0)
HCT: 31 % — ABNORMAL LOW (ref 39.0–52.0)
HCT: 28 % — ABNORMAL LOW (ref 39.0–52.0)
HCT: 30 % — ABNORMAL LOW (ref 39.0–52.0)
HCT: 35 % — ABNORMAL LOW (ref 39.0–52.0)
Hemoglobin: 11.9 g/dL — ABNORMAL LOW (ref 13.0–17.0)
Hemoglobin: 10.5 g/dL — ABNORMAL LOW (ref 13.0–17.0)
Hemoglobin: 8.8 g/dL — ABNORMAL LOW (ref 13.0–17.0)
Hemoglobin: 9.5 g/dL — ABNORMAL LOW (ref 13.0–17.0)
Hemoglobin: 10.2 g/dL — ABNORMAL LOW (ref 13.0–17.0)
Hemoglobin: 11.9 g/dL — ABNORMAL LOW (ref 13.0–17.0)
Potassium: 3.9 mmol/L (ref 3.5–5.1)
Potassium: 3.6 mmol/L (ref 3.5–5.1)
Potassium: 3.7 mmol/L (ref 3.5–5.1)
Potassium: 4.4 mmol/L (ref 3.5–5.1)
Potassium: 4.5 mmol/L (ref 3.5–5.1)
Potassium: 4.1 mmol/L (ref 3.5–5.1)
Sodium: 140 mmol/L (ref 135–145)
Sodium: 137 mmol/L (ref 135–145)
Sodium: 141 mmol/L (ref 135–145)
Sodium: 138 mmol/L (ref 135–145)
Sodium: 139 mmol/L (ref 135–145)
Sodium: 140 mmol/L (ref 135–145)
TCO2: 23 mmol/L (ref 0–100)
TCO2: 24 mmol/L (ref 0–100)
TCO2: 25 mmol/L (ref 0–100)
TCO2: 24 mmol/L (ref 0–100)
TCO2: 23 mmol/L (ref 0–100)
TCO2: 21 mmol/L (ref 0–100)

## 2015-08-29 LAB — CBC
HCT: 36.7 % — ABNORMAL LOW (ref 39.0–52.0)
HCT: 33.3 % — ABNORMAL LOW (ref 39.0–52.0)
HCT: 35.6 % — ABNORMAL LOW (ref 39.0–52.0)
Hemoglobin: 12.7 g/dL — ABNORMAL LOW (ref 13.0–17.0)
Hemoglobin: 11.5 g/dL — ABNORMAL LOW (ref 13.0–17.0)
Hemoglobin: 12.2 g/dL — ABNORMAL LOW (ref 13.0–17.0)
MCH: 31.7 pg (ref 26.0–34.0)
MCH: 31.9 pg (ref 26.0–34.0)
MCH: 31.6 pg (ref 26.0–34.0)
MCHC: 34.6 g/dL (ref 30.0–36.0)
MCHC: 34.5 g/dL (ref 30.0–36.0)
MCHC: 34.3 g/dL (ref 30.0–36.0)
MCV: 91.5 fL (ref 78.0–100.0)
MCV: 92.2 fL (ref 78.0–100.0)
MCV: 92.2 fL (ref 78.0–100.0)
Platelets: 116 10*3/uL — ABNORMAL LOW (ref 150–400)
Platelets: 108 10*3/uL — ABNORMAL LOW (ref 150–400)
Platelets: 138 10*3/uL — ABNORMAL LOW (ref 150–400)
RBC: 4.01 MIL/uL — ABNORMAL LOW (ref 4.22–5.81)
RBC: 3.61 MIL/uL — ABNORMAL LOW (ref 4.22–5.81)
RBC: 3.86 MIL/uL — ABNORMAL LOW (ref 4.22–5.81)
RDW: 13.3 % (ref 11.5–15.5)
RDW: 13.5 % (ref 11.5–15.5)
RDW: 13.7 % (ref 11.5–15.5)
WBC: 6.5 10*3/uL (ref 4.0–10.5)
WBC: 6.1 10*3/uL (ref 4.0–10.5)
WBC: 8.3 10*3/uL (ref 4.0–10.5)

## 2015-08-29 LAB — BASIC METABOLIC PANEL
Anion gap: 9 (ref 5–15)
BUN: 13 mg/dL (ref 6–20)
CO2: 20 mmol/L — ABNORMAL LOW (ref 22–32)
Calcium: 8.2 mg/dL — ABNORMAL LOW (ref 8.9–10.3)
Chloride: 108 mmol/L (ref 101–111)
Creatinine, Ser: 1.07 mg/dL (ref 0.61–1.24)
GFR calc Af Amer: >60 mL/min (ref >60)
GFR calc non Af Amer: >60 mL/min (ref >60)
Glucose, Bld: 138 mg/dL — ABNORMAL HIGH (ref 65–99)
Potassium: 3.6 mmol/L (ref 3.5–5.1)
Sodium: 137 mmol/L (ref 135–145)

## 2015-08-29 LAB — POCT I-STAT 3, ART BLOOD GAS (G3+)
Acid-base deficit: 1 mmol/L (ref 0.0–2.0)
Acid-base deficit: 3 mmol/L — ABNORMAL HIGH (ref 0.0–2.0)
Acid-base deficit: 3 mmol/L — ABNORMAL HIGH (ref 0.0–2.0)
Acid-base deficit: 3 mmol/L — ABNORMAL HIGH (ref 0.0–2.0)
Bicarbonate: 25 mEq/L — ABNORMAL HIGH (ref 20.0–24.0)
Bicarbonate: 23.3 mEq/L (ref 20.0–24.0)
Bicarbonate: 22.2 mEq/L (ref 20.0–24.0)
Bicarbonate: 21.7 mEq/L (ref 20.0–24.0)
O2 Saturation: 100 %
O2 Saturation: 88 %
O2 Saturation: 96 %
O2 Saturation: 97 %
Patient temperature: 36.3
Patient temperature: 36.9
Patient temperature: 37.5
TCO2: 26 mmol/L (ref 0–100)
TCO2: 25 mmol/L (ref 0–100)
TCO2: 23 mmol/L (ref 0–100)
TCO2: 23 mmol/L (ref 0–100)
pCO2 arterial: 44.9 mmHg (ref 35.0–45.0)
pCO2 arterial: 45.5 mmHg — ABNORMAL HIGH (ref 35.0–45.0)
pCO2 arterial: 39.7 mmHg (ref 35.0–45.0)
pCO2 arterial: 37.4 mmHg (ref 35.0–45.0)
pH, Arterial: 7.354 (ref 7.350–7.450)
pH, Arterial: 7.314 — ABNORMAL LOW (ref 7.350–7.450)
pH, Arterial: 7.355 (ref 7.350–7.450)
pH, Arterial: 7.373 (ref 7.350–7.450)
pO2, Arterial: 279 mmHg — ABNORMAL HIGH (ref 80.0–100.0)
pO2, Arterial: 57 mmHg — ABNORMAL LOW (ref 80.0–100.0)
pO2, Arterial: 85 mmHg (ref 80.0–100.0)
pO2, Arterial: 90 mmHg (ref 80.0–100.0)

## 2015-08-29 LAB — GLUCOSE, CAPILLARY
Glucose-Capillary: 118 mg/dL — ABNORMAL HIGH (ref 65–99)
Glucose-Capillary: 146 mg/dL — ABNORMAL HIGH (ref 65–99)
Glucose-Capillary: 133 mg/dL — ABNORMAL HIGH (ref 65–99)
Glucose-Capillary: 118 mg/dL — ABNORMAL HIGH (ref 65–99)
Glucose-Capillary: 96 mg/dL (ref 65–99)
Glucose-Capillary: 98 mg/dL (ref 65–99)
Glucose-Capillary: 107 mg/dL — ABNORMAL HIGH (ref 65–99)
Glucose-Capillary: 119 mg/dL — ABNORMAL HIGH (ref 65–99)
Glucose-Capillary: 118 mg/dL — ABNORMAL HIGH (ref 65–99)

## 2015-08-29 LAB — CREATININE, SERUM
Creatinine, Ser: 1.02 mg/dL (ref 0.61–1.24)
GFR calc Af Amer: >60 mL/min (ref >60)
GFR calc non Af Amer: >60 mL/min (ref >60)

## 2015-08-29 LAB — SURGICAL PCR SCREEN
MRSA, PCR: NEGATIVE
Staphylococcus aureus: NEGATIVE

## 2015-08-29 LAB — POCT I-STAT 4, (NA,K, GLUC, HGB,HCT)
Glucose, Bld: 166 mg/dL — ABNORMAL HIGH (ref 65–99)
HCT: 33 % — ABNORMAL LOW (ref 39.0–52.0)
Hemoglobin: 11.2 g/dL — ABNORMAL LOW (ref 13.0–17.0)
Potassium: 4 mmol/L (ref 3.5–5.1)
Sodium: 138 mmol/L (ref 135–145)

## 2015-08-29 LAB — PROTIME-INR
INR: 1.41 (ref 0.00–1.49)
Prothrombin Time: 17.4 seconds — ABNORMAL HIGH (ref 11.6–15.2)

## 2015-08-29 LAB — PLATELET COUNT: Platelets: 78 10*3/uL — ABNORMAL LOW (ref 150–400)

## 2015-08-29 LAB — APTT: aPTT: 29 seconds (ref 24–37)

## 2015-08-29 LAB — HEMOGLOBIN AND HEMATOCRIT, BLOOD
HCT: 27.2 % — ABNORMAL LOW (ref 39.0–52.0)
Hemoglobin: 9.5 g/dL — ABNORMAL LOW (ref 13.0–17.0)

## 2015-08-29 LAB — MAGNESIUM: Magnesium: 3.1 mg/dL — ABNORMAL HIGH (ref 1.7–2.4)

## 2015-08-29 SURGERY — CORONARY ARTERY BYPASS GRAFTING (CABG)
Anesthesia: General | Site: Chest

## 2015-08-29 MED ORDER — POTASSIUM CHLORIDE 10 MEQ/50ML IV SOLN: 10.0000 meq | INTRAVENOUS | Status: AC

## 2015-08-29 MED ORDER — PANTOPRAZOLE SODIUM 40 MG PO TBEC
40.0000 mg | DELAYED_RELEASE_TABLET | Freq: Every day | ORAL | Status: DC
  Administered 2015-08-31 – 2015-09-04 (×5): 40 mg via ORAL
  Filled 2015-08-29 (×5): qty 1

## 2015-08-29 MED ORDER — HYDROMORPHONE HCL 1 MG/ML IJ SOLN: 0.2500 mg | INTRAMUSCULAR | Status: DC | PRN

## 2015-08-29 MED ORDER — FENTANYL CITRATE (PF) 250 MCG/5ML IJ SOLN
INTRAMUSCULAR | Status: AC
  Filled 2015-08-29: qty 5

## 2015-08-29 MED ORDER — SODIUM CHLORIDE 0.9 % IJ SOLN
3.0000 mL | Freq: Two times a day (BID) | INTRAMUSCULAR | Status: DC
Start: 2015-08-30 — End: 2015-09-02
  Administered 2015-08-30: 6 mL via INTRAVENOUS
  Administered 2015-08-31 – 2015-09-02 (×4): 3 mL via INTRAVENOUS

## 2015-08-29 MED ORDER — BISACODYL 5 MG PO TBEC
10.0000 mg | DELAYED_RELEASE_TABLET | Freq: Every day | ORAL | Status: DC
  Administered 2015-08-30 – 2015-09-03 (×3): 10 mg via ORAL
  Filled 2015-08-29 (×4): qty 2

## 2015-08-29 MED ORDER — ASPIRIN EC 325 MG PO TBEC
325.0000 mg | DELAYED_RELEASE_TABLET | Freq: Every day | ORAL | Status: DC
  Administered 2015-08-30 – 2015-09-04 (×6): 325 mg via ORAL
  Filled 2015-08-29 (×6): qty 1

## 2015-08-29 MED ORDER — ACETAMINOPHEN 160 MG/5ML PO SOLN: 1000.0000 mg | Freq: Four times a day (QID) | ORAL | Status: AC

## 2015-08-29 MED ORDER — LACTATED RINGERS IV SOLN: 500.0000 mL | Freq: Once | INTRAVENOUS | Status: DC | PRN

## 2015-08-29 MED ORDER — LACTATED RINGERS IV SOLN: INTRAVENOUS | Status: DC

## 2015-08-29 MED ORDER — METOPROLOL TARTRATE 25 MG/10 ML ORAL SUSPENSION
12.5000 mg | Freq: Two times a day (BID) | ORAL | Status: DC
  Filled 2015-08-29 (×9): qty 5

## 2015-08-29 MED ORDER — 0.9 % SODIUM CHLORIDE (POUR BTL) OPTIME
TOPICAL | Status: DC | PRN
  Administered 2015-08-29: 6000 mL

## 2015-08-29 MED ORDER — MIDAZOLAM HCL 2 MG/2ML IJ SOLN: 2.0000 mg | INTRAMUSCULAR | Status: DC | PRN

## 2015-08-29 MED ORDER — MIDAZOLAM HCL 2 MG/2ML IJ SOLN
INTRAMUSCULAR | Status: AC
  Filled 2015-08-29: qty 4

## 2015-08-29 MED ORDER — MIDAZOLAM HCL 5 MG/5ML IJ SOLN
INTRAMUSCULAR | Status: DC | PRN
  Administered 2015-08-29: 2 mg via INTRAVENOUS
  Administered 2015-08-29 (×2): 3 mg via INTRAVENOUS
  Administered 2015-08-29 (×4): 2 mg via INTRAVENOUS

## 2015-08-29 MED ORDER — SODIUM CHLORIDE 0.9 % IV SOLN
250.0000 mL | INTRAVENOUS | Status: DC
Start: 2015-08-30 — End: 2015-09-02
  Administered 2015-08-29: 250 mL via INTRAVENOUS

## 2015-08-29 MED ORDER — SUCCINYLCHOLINE CHLORIDE 20 MG/ML IJ SOLN
INTRAMUSCULAR | Status: DC | PRN
  Administered 2015-08-29: 140 mg via INTRAVENOUS

## 2015-08-29 MED ORDER — ONDANSETRON HCL 4 MG/2ML IJ SOLN: 4.0000 mg | Freq: Four times a day (QID) | INTRAMUSCULAR | Status: DC | PRN

## 2015-08-29 MED ORDER — DEXMEDETOMIDINE HCL IN NACL 200 MCG/50ML IV SOLN
0.0000 ug/kg/h | INTRAVENOUS | Status: DC
  Administered 2015-08-30: 0.2 ug/kg/h via INTRAVENOUS
  Filled 2015-08-29: qty 50

## 2015-08-29 MED ORDER — FENTANYL CITRATE (PF) 100 MCG/2ML IJ SOLN
INTRAMUSCULAR | Status: DC | PRN
  Administered 2015-08-29: 100 ug via INTRAVENOUS
  Administered 2015-08-29 (×2): 150 ug via INTRAVENOUS
  Administered 2015-08-29: 100 ug via INTRAVENOUS
  Administered 2015-08-29 (×5): 150 ug via INTRAVENOUS
  Administered 2015-08-29: 50 ug via INTRAVENOUS
  Administered 2015-08-29: 200 ug via INTRAVENOUS

## 2015-08-29 MED ORDER — CHLORHEXIDINE GLUCONATE 0.12% ORAL RINSE (MEDLINE KIT)
15.0000 mL | Freq: Two times a day (BID) | OROMUCOSAL | Status: DC
  Administered 2015-08-30 – 2015-08-31 (×4): 15 mL via OROMUCOSAL

## 2015-08-29 MED ORDER — ACETAMINOPHEN 160 MG/5ML PO SOLN: 650.0000 mg | Freq: Once | ORAL | Status: AC

## 2015-08-29 MED ORDER — PROPOFOL 10 MG/ML IV BOLUS
INTRAVENOUS | Status: DC | PRN
  Administered 2015-08-29: 100 mg via INTRAVENOUS

## 2015-08-29 MED ORDER — PROTAMINE SULFATE 10 MG/ML IV SOLN
INTRAVENOUS | Status: AC
  Filled 2015-08-29: qty 25

## 2015-08-29 MED ORDER — ARTIFICIAL TEARS OP OINT
TOPICAL_OINTMENT | OPHTHALMIC | Status: DC | PRN
  Administered 2015-08-29: 1 via OPHTHALMIC

## 2015-08-29 MED ORDER — DEXMEDETOMIDINE HCL IN NACL 200 MCG/50ML IV SOLN
INTRAVENOUS | Status: AC
  Filled 2015-08-29: qty 50

## 2015-08-29 MED ORDER — SODIUM CHLORIDE 0.9 % IJ SOLN
3.0000 mL | INTRAMUSCULAR | Status: DC | PRN
  Administered 2015-08-30: 6 mL via INTRAVENOUS
  Filled 2015-08-29: qty 3

## 2015-08-29 MED ORDER — METOPROLOL TARTRATE 1 MG/ML IV SOLN: 2.5000 mg | INTRAVENOUS | Status: DC | PRN

## 2015-08-29 MED ORDER — HEMOSTATIC AGENTS (NO CHARGE) OPTIME
TOPICAL | Status: DC | PRN
  Administered 2015-08-29 (×4): 1 via TOPICAL

## 2015-08-29 MED ORDER — SODIUM CHLORIDE 0.9 % IV SOLN
INTRAVENOUS | Status: AC
  Administered 2015-08-29: 19:00:00 via INTRAVENOUS
  Filled 2015-08-29 (×3): qty 2.5

## 2015-08-29 MED ORDER — ANTISEPTIC ORAL RINSE SOLUTION (CORINZ)
7.0000 mL | Freq: Four times a day (QID) | OROMUCOSAL | Status: DC
  Administered 2015-08-30 – 2015-09-01 (×9): 7 mL via OROMUCOSAL

## 2015-08-29 MED ORDER — LACTATED RINGERS IV SOLN
INTRAVENOUS | Status: DC | PRN
  Administered 2015-08-29 (×2): via INTRAVENOUS

## 2015-08-29 MED ORDER — ACETAMINOPHEN 500 MG PO TABS
1000.0000 mg | ORAL_TABLET | Freq: Four times a day (QID) | ORAL | Status: AC
  Administered 2015-08-30 – 2015-09-03 (×18): 1000 mg via ORAL
  Filled 2015-08-29 (×22): qty 2

## 2015-08-29 MED ORDER — MAGNESIUM SULFATE 4 GM/100ML IV SOLN
4.0000 g | Freq: Once | INTRAVENOUS | Status: AC
  Administered 2015-08-29: 4 g via INTRAVENOUS
  Filled 2015-08-29: qty 100

## 2015-08-29 MED ORDER — ALBUMIN HUMAN 5 % IV SOLN
250.0000 mL | INTRAVENOUS | Status: AC | PRN
  Administered 2015-08-30: 250 mL via INTRAVENOUS
  Filled 2015-08-29: qty 250

## 2015-08-29 MED ORDER — NITROGLYCERIN IN D5W 200-5 MCG/ML-% IV SOLN: 0.0000 ug/min | INTRAVENOUS | Status: DC

## 2015-08-29 MED ORDER — TRAMADOL HCL 50 MG PO TABS
50.0000 mg | ORAL_TABLET | ORAL | Status: DC | PRN
  Administered 2015-08-30 – 2015-09-02 (×9): 100 mg via ORAL
  Filled 2015-08-29 (×9): qty 2

## 2015-08-29 MED ORDER — ROCURONIUM BROMIDE 100 MG/10ML IV SOLN
INTRAVENOUS | Status: DC | PRN
  Administered 2015-08-29: 100 mg via INTRAVENOUS

## 2015-08-29 MED ORDER — LACTATED RINGERS IV SOLN
INTRAVENOUS | Status: DC | PRN
  Administered 2015-08-29: 07:00:00 via INTRAVENOUS

## 2015-08-29 MED ORDER — OXYCODONE HCL 5 MG PO TABS
5.0000 mg | ORAL_TABLET | ORAL | Status: DC | PRN
  Administered 2015-08-30 (×3): 10 mg via ORAL
  Filled 2015-08-29 (×4): qty 2

## 2015-08-29 MED ORDER — PHENYLEPHRINE HCL 10 MG/ML IJ SOLN
0.0000 ug/min | INTRAVENOUS | Status: DC
  Filled 2015-08-29 (×3): qty 2

## 2015-08-29 MED ORDER — PROPOFOL 10 MG/ML IV BOLUS
INTRAVENOUS | Status: AC
  Filled 2015-08-29: qty 20

## 2015-08-29 MED ORDER — BISACODYL 10 MG RE SUPP: 10.0000 mg | Freq: Every day | RECTAL | Status: DC

## 2015-08-29 MED ORDER — PROMETHAZINE HCL 25 MG/ML IJ SOLN: 6.2500 mg | INTRAMUSCULAR | Status: DC | PRN

## 2015-08-29 MED ORDER — LACTATED RINGERS IV SOLN
INTRAVENOUS | Status: DC | PRN
  Administered 2015-08-29 (×2): via INTRAVENOUS

## 2015-08-29 MED ORDER — HEPARIN SODIUM (PORCINE) 1000 UNIT/ML IJ SOLN
INTRAMUSCULAR | Status: AC
  Filled 2015-08-29: qty 1

## 2015-08-29 MED ORDER — VECURONIUM BROMIDE 10 MG IV SOLR
INTRAVENOUS | Status: DC | PRN
  Administered 2015-08-29: 5 mg via INTRAVENOUS

## 2015-08-29 MED ORDER — PROTAMINE SULFATE 10 MG/ML IV SOLN
INTRAVENOUS | Status: DC | PRN
  Administered 2015-08-29: 50 mg via INTRAVENOUS
  Administered 2015-08-29: 100 mg via INTRAVENOUS
  Administered 2015-08-29: 40 mg via INTRAVENOUS
  Administered 2015-08-29: 50 mg via INTRAVENOUS
  Administered 2015-08-29: 60 mg via INTRAVENOUS

## 2015-08-29 MED ORDER — MIDAZOLAM HCL 10 MG/2ML IJ SOLN
INTRAMUSCULAR | Status: AC
  Filled 2015-08-29: qty 4

## 2015-08-29 MED ORDER — ALBUMIN HUMAN 5 % IV SOLN
INTRAVENOUS | Status: DC | PRN
  Administered 2015-08-29 (×2): via INTRAVENOUS

## 2015-08-29 MED ORDER — GERHARDT'S BUTT CREAM
TOPICAL_CREAM | CUTANEOUS | Status: DC | PRN
  Administered 2015-08-29: 19:00:00 via TOPICAL
  Filled 2015-08-29: qty 1

## 2015-08-29 MED ORDER — SODIUM CHLORIDE 0.45 % IV SOLN
INTRAVENOUS | Status: DC | PRN
  Administered 2015-08-29: 10 mL via INTRAVENOUS

## 2015-08-29 MED ORDER — DEXTROSE 5 % IV SOLN
1.5000 g | Freq: Two times a day (BID) | INTRAVENOUS | Status: AC
  Administered 2015-08-29 – 2015-08-31 (×4): 1.5 g via INTRAVENOUS
  Filled 2015-08-29 (×4): qty 1.5

## 2015-08-29 MED ORDER — HEPARIN SODIUM (PORCINE) 1000 UNIT/ML IJ SOLN
INTRAMUSCULAR | Status: DC | PRN
  Administered 2015-08-29: 32000 [IU] via INTRAVENOUS

## 2015-08-29 MED ORDER — VANCOMYCIN HCL IN DEXTROSE 1-5 GM/200ML-% IV SOLN
1000.0000 mg | Freq: Once | INTRAVENOUS | Status: AC
  Administered 2015-08-29: 1000 mg via INTRAVENOUS
  Filled 2015-08-29: qty 200

## 2015-08-29 MED ORDER — ACETAMINOPHEN 650 MG RE SUPP
650.0000 mg | Freq: Once | RECTAL | Status: AC
  Administered 2015-08-29: 650 mg via RECTAL

## 2015-08-29 MED ORDER — INSULIN REGULAR BOLUS VIA INFUSION
0.0000 [IU] | Freq: Three times a day (TID) | INTRAVENOUS | Status: DC
  Filled 2015-08-29: qty 10

## 2015-08-29 MED ORDER — ASPIRIN 81 MG PO CHEW: 324.0000 mg | CHEWABLE_TABLET | Freq: Every day | ORAL | Status: DC

## 2015-08-29 MED ORDER — DOCUSATE SODIUM 100 MG PO CAPS
200.0000 mg | ORAL_CAPSULE | Freq: Every day | ORAL | Status: DC
  Administered 2015-08-30 – 2015-09-04 (×6): 200 mg via ORAL
  Filled 2015-08-29 (×5): qty 2

## 2015-08-29 MED ORDER — METOPROLOL TARTRATE 12.5 MG HALF TABLET
12.5000 mg | ORAL_TABLET | Freq: Two times a day (BID) | ORAL | Status: DC
  Administered 2015-08-31 – 2015-09-04 (×8): 12.5 mg via ORAL
  Filled 2015-08-29 (×13): qty 1

## 2015-08-29 MED ORDER — MORPHINE SULFATE (PF) 2 MG/ML IV SOLN
2.0000 mg | INTRAVENOUS | Status: DC | PRN
  Administered 2015-08-29 – 2015-08-30 (×5): 4 mg via INTRAVENOUS
  Filled 2015-08-29 (×5): qty 2

## 2015-08-29 MED ORDER — FAMOTIDINE IN NACL 20-0.9 MG/50ML-% IV SOLN
20.0000 mg | Freq: Two times a day (BID) | INTRAVENOUS | Status: AC
  Administered 2015-08-29: 20 mg via INTRAVENOUS

## 2015-08-29 MED ORDER — SODIUM CHLORIDE 0.9 % IV SOLN: INTRAVENOUS | Status: DC

## 2015-08-29 MED ORDER — MORPHINE SULFATE (PF) 2 MG/ML IV SOLN
1.0000 mg | INTRAVENOUS | Status: AC | PRN
  Administered 2015-08-29: 2 mg via INTRAVENOUS
  Filled 2015-08-29: qty 1

## 2015-08-29 MED FILL — Heparin Sodium (Porcine) Inj 1000 Unit/ML: INTRAMUSCULAR | Qty: 10 | Status: AC

## 2015-08-29 MED FILL — Mannitol IV Soln 20%: INTRAVENOUS | Qty: 500 | Status: AC

## 2015-08-29 MED FILL — Electrolyte-R (PH 7.4) Solution: INTRAVENOUS | Qty: 4000 | Status: AC

## 2015-08-29 MED FILL — Lidocaine HCl IV Inj 20 MG/ML: INTRAVENOUS | Qty: 5 | Status: AC

## 2015-08-29 MED FILL — Magnesium Sulfate Inj 50%: INTRAMUSCULAR | Qty: 10 | Status: AC

## 2015-08-29 MED FILL — Heparin Sodium (Porcine) Inj 1000 Unit/ML: INTRAMUSCULAR | Qty: 30 | Status: AC

## 2015-08-29 MED FILL — Sodium Chloride IV Soln 0.9%: INTRAVENOUS | Qty: 2000 | Status: AC

## 2015-08-29 MED FILL — Sodium Bicarbonate IV Soln 8.4%: INTRAVENOUS | Qty: 50 | Status: AC

## 2015-08-29 MED FILL — Potassium Chloride Inj 2 mEq/ML: INTRAVENOUS | Qty: 40 | Status: AC

## 2015-08-29 SURGICAL SUPPLY — 104 items
APL SKNCLS STERI-STRIP NONHPOA (GAUZE/BANDAGES/DRESSINGS) ×2
BAG DECANTER FOR FLEXI CONT (MISCELLANEOUS) ×3 IMPLANT
BANDAGE ELASTIC 4 VELCRO ST LF (GAUZE/BANDAGES/DRESSINGS) ×3 IMPLANT
BANDAGE ELASTIC 6 VELCRO ST LF (GAUZE/BANDAGES/DRESSINGS) ×3 IMPLANT
BASKET HEART (ORDER IN 25'S) (MISCELLANEOUS) ×1
BASKET HEART (ORDER IN 25S) (MISCELLANEOUS) ×2 IMPLANT
BENZOIN TINCTURE PRP APPL 2/3 (GAUZE/BANDAGES/DRESSINGS) ×1 IMPLANT
BLADE STERNUM SYSTEM 6 (BLADE) ×3 IMPLANT
BLADE SURG 11 STRL SS (BLADE) ×2 IMPLANT
BNDG GAUZE ELAST 4 BULKY (GAUZE/BANDAGES/DRESSINGS) ×3 IMPLANT
CANISTER SUCTION 2500CC (MISCELLANEOUS) ×3 IMPLANT
CANNULA EZ GLIDE AORTIC 21FR (CANNULA) ×3 IMPLANT
CANNULA SOFTFLOW AORTIC 8M24FR (CANNULA) ×1 IMPLANT
CATH CPB KIT HENDRICKSON (MISCELLANEOUS) ×3 IMPLANT
CATH ROBINSON RED A/P 18FR (CATHETERS) ×3 IMPLANT
CATH THORACIC 36FR (CATHETERS) ×3 IMPLANT
CATH THORACIC 36FR RT ANG (CATHETERS) ×3 IMPLANT
CLIP TI MEDIUM 24 (CLIP) IMPLANT
CLIP TI WIDE RED SMALL 24 (CLIP) ×2 IMPLANT
CLSR STERI-STRIP ANTIMIC 1/2X4 (GAUZE/BANDAGES/DRESSINGS) ×1 IMPLANT
CRADLE DONUT ADULT HEAD (MISCELLANEOUS) ×3 IMPLANT
DRAPE CARDIOVASCULAR INCISE (DRAPES) ×3
DRAPE SLUSH/WARMER DISC (DRAPES) ×3 IMPLANT
DRAPE SRG 135X102X78XABS (DRAPES) ×2 IMPLANT
DRSG AQUACEL AG ADV 3.5X14 (GAUZE/BANDAGES/DRESSINGS) ×1 IMPLANT
DRSG COVADERM 4X14 (GAUZE/BANDAGES/DRESSINGS) ×3 IMPLANT
ELECT BLADE 4.0 EZ CLEAN MEGAD (MISCELLANEOUS) ×3
ELECT REM PT RETURN 9FT ADLT (ELECTROSURGICAL) ×6
ELECTRODE BLDE 4.0 EZ CLN MEGD (MISCELLANEOUS) IMPLANT
ELECTRODE REM PT RTRN 9FT ADLT (ELECTROSURGICAL) ×4 IMPLANT
GAUZE SPONGE 4X4 12PLY STRL (GAUZE/BANDAGES/DRESSINGS) ×6 IMPLANT
GLOVE BIO SURGEON STRL SZ 6 (GLOVE) ×2 IMPLANT
GLOVE BIO SURGEON STRL SZ 6.5 (GLOVE) ×4 IMPLANT
GLOVE BIOGEL PI IND STRL 6 (GLOVE) IMPLANT
GLOVE BIOGEL PI IND STRL 6.5 (GLOVE) IMPLANT
GLOVE BIOGEL PI IND STRL 8 (GLOVE) IMPLANT
GLOVE BIOGEL PI INDICATOR 6 (GLOVE) ×2
GLOVE BIOGEL PI INDICATOR 6.5 (GLOVE) ×2
GLOVE BIOGEL PI INDICATOR 8 (GLOVE) ×1
GLOVE SURG SIGNA 7.5 PF LTX (GLOVE) ×8 IMPLANT
GOWN STRL REUS W/ TWL LRG LVL3 (GOWN DISPOSABLE) ×8 IMPLANT
GOWN STRL REUS W/ TWL XL LVL3 (GOWN DISPOSABLE) ×4 IMPLANT
GOWN STRL REUS W/TWL LRG LVL3 (GOWN DISPOSABLE) ×24
GOWN STRL REUS W/TWL XL LVL3 (GOWN DISPOSABLE) ×6
HEMOSTAT POWDER SURGIFOAM 1G (HEMOSTASIS) ×9 IMPLANT
HEMOSTAT SURGICEL 2X14 (HEMOSTASIS) ×3 IMPLANT
INSERT FOGARTY 61MM (MISCELLANEOUS) ×1 IMPLANT
INSERT FOGARTY XLG (MISCELLANEOUS) ×1 IMPLANT
KIT BASIN OR (CUSTOM PROCEDURE TRAY) ×3 IMPLANT
KIT ROOM TURNOVER OR (KITS) ×3 IMPLANT
KIT SUCTION CATH 14FR (SUCTIONS) ×6 IMPLANT
KIT VASOVIEW W/TROCAR VH 2000 (KITS) ×3 IMPLANT
MARKER GRAFT CORONARY BYPASS (MISCELLANEOUS) ×9 IMPLANT
NS IRRIG 1000ML POUR BTL (IV SOLUTION) ×15 IMPLANT
PACK OPEN HEART (CUSTOM PROCEDURE TRAY) ×3 IMPLANT
PAD ARMBOARD 7.5X6 YLW CONV (MISCELLANEOUS) ×6 IMPLANT
PAD ELECT DEFIB RADIOL ZOLL (MISCELLANEOUS) ×3 IMPLANT
PENCIL BUTTON HOLSTER BLD 10FT (ELECTRODE) ×3 IMPLANT
PUNCH AORTIC ROTATE 4.0MM (MISCELLANEOUS) IMPLANT
PUNCH AORTIC ROTATE 4.5MM 8IN (MISCELLANEOUS) ×1 IMPLANT
PUNCH AORTIC ROTATE 5MM 8IN (MISCELLANEOUS) IMPLANT
SET CARDIOPLEGIA MPS 5001102 (MISCELLANEOUS) ×1 IMPLANT
SPONGE GAUZE 4X4 12PLY STER LF (GAUZE/BANDAGES/DRESSINGS) ×1 IMPLANT
STRIP CLOSURE SKIN 1/2X4 (GAUZE/BANDAGES/DRESSINGS) ×1 IMPLANT
SUT BONE WAX W31G (SUTURE) ×3 IMPLANT
SUT ETHIBOND 2 0 SH (SUTURE) ×12
SUT ETHIBOND 2 0 SH 36X2 (SUTURE) IMPLANT
SUT MNCRL AB 4-0 PS2 18 (SUTURE) ×1 IMPLANT
SUT PROLENE 3 0 SH DA (SUTURE) ×3 IMPLANT
SUT PROLENE 4 0 RB 1 (SUTURE) ×6
SUT PROLENE 4 0 SH DA (SUTURE) IMPLANT
SUT PROLENE 4-0 RB1 .5 CRCL 36 (SUTURE) IMPLANT
SUT PROLENE 5 0 C 1 36 (SUTURE) ×2 IMPLANT
SUT PROLENE 6 0 C 1 30 (SUTURE) ×12 IMPLANT
SUT PROLENE 7 0 BV 1 (SUTURE) ×2 IMPLANT
SUT PROLENE 7 0 BV1 MDA (SUTURE) ×3 IMPLANT
SUT PROLENE 8 0 BV175 6 (SUTURE) ×3 IMPLANT
SUT SILK  1 MH (SUTURE) ×3
SUT SILK 1 MH (SUTURE) IMPLANT
SUT SILK 1 TIES 10X30 (SUTURE) ×2 IMPLANT
SUT SILK 2 0 SH CR/8 (SUTURE) ×2 IMPLANT
SUT SILK 3 0 SH CR/8 (SUTURE) ×1 IMPLANT
SUT SILK 4 0 TIE 10X30 (SUTURE) ×2 IMPLANT
SUT STEEL 6MS V (SUTURE) ×3 IMPLANT
SUT STEEL STERNAL CCS#1 18IN (SUTURE) IMPLANT
SUT STEEL SZ 6 DBL 3X14 BALL (SUTURE) ×3 IMPLANT
SUT TEM PAC WIRE 2 0 SH (SUTURE) ×4 IMPLANT
SUT VIC AB 1 CTX 36 (SUTURE) ×6
SUT VIC AB 1 CTX36XBRD ANBCTR (SUTURE) ×4 IMPLANT
SUT VIC AB 2-0 CT1 27 (SUTURE) ×3
SUT VIC AB 2-0 CT1 TAPERPNT 27 (SUTURE) IMPLANT
SUT VIC AB 2-0 CTX 27 (SUTURE) ×3 IMPLANT
SUT VIC AB 3-0 SH 27 (SUTURE)
SUT VIC AB 3-0 SH 27X BRD (SUTURE) IMPLANT
SUT VIC AB 3-0 X1 27 (SUTURE) ×3 IMPLANT
SUT VICRYL 4-0 PS2 18IN ABS (SUTURE) IMPLANT
SYSTEM SAHARA CHEST DRAIN ATS (WOUND CARE) ×3 IMPLANT
TOWEL OR 17X24 6PK STRL BLUE (TOWEL DISPOSABLE) ×6 IMPLANT
TOWEL OR 17X26 10 PK STRL BLUE (TOWEL DISPOSABLE) ×6 IMPLANT
TRAY FOLEY IC TEMP SENS 16FR (CATHETERS) ×3 IMPLANT
TUBE FEEDING 8FR 16IN STR KANG (MISCELLANEOUS) ×3 IMPLANT
TUBING INSUFFLATION (TUBING) ×3 IMPLANT
UNDERPAD 30X30 INCONTINENT (UNDERPADS AND DIAPERS) ×3 IMPLANT
WATER STERILE IRR 1000ML POUR (IV SOLUTION) ×6 IMPLANT

## 2015-08-29 NOTE — H&P (View-Only) (Signed)
3 Days Post-Op Procedure(s) (LRB): Left Heart Cath and Coronary Angiography (N/A) Subjective: No complaints  Objective: Vital signs in last 24 hours: Temp:  [98.5 F (36.9 C)-98.9 F (37.2 C)] 98.5 F (36.9 C) (09/15 0614) Pulse Rate:  [67-74] 67 (09/15 0614) Cardiac Rhythm:  [-] Sinus bradycardia;Bundle branch block (09/15 0700) Resp:  [18] 18 (09/15 0614) BP: (122-134)/(70-77) 130/72 mmHg (09/15 0614) SpO2:  [97 %-99 %] 97 % (09/15 0614) Weight:  [255 lb 1.2 oz (115.7 kg)] 255 lb 1.2 oz (115.7 kg) (09/15 0614)  Hemodynamic parameters for last 24 hours:    Intake/Output from previous day:   Intake/Output this shift:    General appearance: alert, cooperative and no distress Neurologic: intact Heart: regular rate and rhythm  Lab Results:  Recent Labs  08/27/15 0535 08/28/15 0435  WBC 7.1 6.5  HGB 13.4 12.6*  HCT 37.0* 36.4*  PLT 120* 124*   BMET:  Recent Labs  08/26/15 0427 08/27/15 0535  NA 137 139  K 3.3* 3.5  CL 105 109  CO2 24 20*  GLUCOSE 115* 133*  BUN 14 14  CREATININE 1.23 1.19  CALCIUM 7.9* 8.5*    PT/INR: No results for input(s): LABPROT, INR in the last 72 hours. ABG No results found for: PHART, HCO3, TCO2, ACIDBASEDEF, O2SAT CBG (last 3)   Recent Labs  08/27/15 1612 08/27/15 2329 08/28/15 0612  GLUCAP 169* 136* 107*    Assessment/Plan: S/P Procedure(s) (LRB): Left Heart Cath and Coronary Angiography (N/A) For CABG in AM  Vascular studies OK All questions answered preop orders written   LOS: 5 days    [name] 08/28/2015

## 2015-08-29 NOTE — Progress Notes (Signed)
  Echocardiogram Echocardiogram Transesophageal has been performed.  [name] 08/29/2015, 8:37 AM

## 2015-08-29 NOTE — Interval H&P Note (Signed)
History and Physical Interval Note:  08/29/2015 7:16 AM  [name]  has presented today for surgery, with the diagnosis of CAD  The various methods of treatment have been discussed with the patient and family. After consideration of risks, benefits and other options for treatment, the patient has consented to  Procedure(s): CORONARY ARTERY BYPASS GRAFTING (CABG) (N/A) TRANSESOPHAGEAL ECHOCARDIOGRAM (TEE) (N/A) as a surgical intervention .  The patient's history has been reviewed, patient examined, no change in status, stable for surgery.  I have reviewed the patient's chart and labs.  Questions were answered to the patient's satisfaction.     [name]

## 2015-08-29 NOTE — Transfer of Care (Signed)
Immediate Anesthesia Transfer of Care Note  Patient: [name]  Procedure(s) Performed: Procedure(s): CORONARY ARTERY BYPASS GRAFTING (CABG) (N/A) TRANSESOPHAGEAL ECHOCARDIOGRAM (TEE) (N/A)  Patient Location: SICU  Anesthesia Type:General  Level of Consciousness: Patient remains intubated per anesthesia plan  Airway & Oxygen Therapy: Patient remains intubated per anesthesia plan and Patient placed on Ventilator (see vital sign flow sheet for setting)  Post-op Assessment: Report given to RN and Post -op Vital signs reviewed and stable  Post vital signs: Reviewed and stable  Last Vitals:  Filed Vitals:   08/29/15 0500  BP: 137/84  Pulse: 66  Temp: 36.6 C  Resp:     Complications: No apparent anesthesia complications

## 2015-08-29 NOTE — Progress Notes (Signed)
Patient with recent operation. Cardiothoracic to take over care.  Should any medical questions arise please feel free to consult.   Will sign off  VEGA, [employer]

## 2015-08-29 NOTE — Progress Notes (Signed)
Patient ID: KOLLIN RAAB, male   DOB: 1961/06/20, 54 y.o.   MRN: [id number] EVENING ROUNDS NOTE :     [address]       [place] [address]             [phone]                 Day of Surgery Procedure(s) (LRB): CORONARY ARTERY BYPASS GRAFTING (CABG) (N/A) TRANSESOPHAGEAL ECHOCARDIOGRAM (TEE) (N/A)  Total Length of Stay:  LOS: 6 days  BP 71/34 mmHg  Pulse 82  Temp(Src) 100 F (37.8 C) (Oral)  Resp 16  Ht 5\' 10"  (1.778 m)  Wt 252 lb 10.4 oz (114.6 kg)  BMI 36.25 kg/m2  SpO2 100%  .Intake/Output      09/15 0701 - 09/16 0700 09/16 0701 - 09/17 0700   P.O. 720    I.V. (mL/kg)  3491.5 (30.5)   Blood  596   NG/GT  30   IV Piggyback  400   Total Intake(mL/kg) 720 (6.3) 4517.5 (39.4)   Urine (mL/kg/hr) 1500 (0.5) 2860 (2.1)   Blood  1625 (1.2)   Chest Tube  90 (0.1)   Total Output 1500 4575   Net -780 -57.5        Urine Occurrence  325 x     . sodium chloride 10 mL/hr at 08/29/15 1800  . [START ON 08/30/2015] sodium chloride 250 mL (08/29/15 1349)  . sodium chloride 10 mL/hr at 08/29/15 1800  . dexmedetomidine Stopped (08/29/15 1530)  . insulin (NOVOLIN-R) infusion 1.1 Units/hr (08/29/15 1800)  . lactated ringers 10 mL/hr at 08/29/15 1800  . lactated ringers    . nitroGLYCERIN    . phenylephrine (NEO-SYNEPHRINE) Adult infusion Stopped (08/29/15 1315)     Lab Results  Component Value Date   WBC 6.1 08/29/2015   HGB 11.2* 08/29/2015   HCT 33.0* 08/29/2015   PLT 108* 08/29/2015   GLUCOSE 166* 08/29/2015   ALT 29 08/23/2015   AST 71* 08/23/2015   NA 138 08/29/2015   K 4.0 08/29/2015   CL 103 08/29/2015   CREATININE 0.90 08/29/2015   BUN 10 08/29/2015   CO2 20* 08/29/2015   INR 1.41 08/29/2015   HGBA1C 9.3* 08/24/2015   Extubated to bipap,  abg good Not bleeding   [name] MD  Beeper [phone] Office [phone] 08/29/2015 6:58 PM

## 2015-08-29 NOTE — Anesthesia Preprocedure Evaluation (Addendum)
Anesthesia Evaluation  Patient identified by MRN, date of birth, ID band Patient awake    Reviewed: Allergy & Precautions, NPO status , Patient's Chart, lab work & pertinent test results  Airway Mallampati: II  TM Distance: <3 FB Neck ROM: Full    Dental no notable dental hx.    Pulmonary neg pulmonary ROS,    Pulmonary exam normal breath sounds clear to auscultation       Cardiovascular hypertension, + CAD and + Past MI  Normal cardiovascular exam Rhythm:Regular Rate:Normal  Left ventricle: The cavity size was normal. Wall thickness was increased in a pattern of mild LVH. Systolic function was mildly reduced. The estimated ejection fraction was in the range of 45% to 50%. Images were inadequate for LV wall motion assessment. The transmitral flow pattern was normal. The deceleration time of the early transmitral flow velocity was normal. The pulmonary vein flow pattern was normal. The tissue Doppler parameters were normal. Left ventricular diastolic function parameters were normal.     Neuro/Psych negative neurological ROS  negative psych ROS   GI/Hepatic negative GI ROS, Neg liver ROS,   Endo/Other  diabetesMorbid obesity  Renal/GU negative Renal ROS  negative genitourinary   Musculoskeletal negative musculoskeletal ROS (+)   Abdominal   Peds negative pediatric ROS (+)  Hematology negative hematology ROS (+)   Anesthesia Other Findings   Reproductive/Obstetrics negative OB ROS                            Anesthesia Physical Anesthesia Plan  ASA: IV  Anesthesia Plan: General   Post-op Pain Management:    Induction: Intravenous  Airway Management Planned: Oral ETT  Additional Equipment: Arterial line, PA Cath, TEE, Ultrasound Guidance Line Placement and CVP  Intra-op Plan:   Post-operative Plan: Post-operative intubation/ventilation  Informed Consent: I have reviewed the  patients History and Physical, chart, labs and discussed the procedure including the risks, benefits and alternatives for the proposed anesthesia with the patient or authorized representative who has indicated his/her understanding and acceptance.   Dental advisory given  Plan Discussed with: CRNA and Surgeon  Anesthesia Plan Comments:         Anesthesia Quick Evaluation

## 2015-08-29 NOTE — Progress Notes (Signed)
Patient using his abdominal muscles to breath while dosing off to sleep. RN aware and notifying MD. Patient could benefit from BiPAP PRN.

## 2015-08-29 NOTE — OR Nursing (Signed)
RN called 2S, provided patient update.

## 2015-08-29 NOTE — OR Nursing (Signed)
RN contacted 2S, providing patient update.

## 2015-08-29 NOTE — OR Nursing (Signed)
RN contacted 2S, confirming 2S05.

## 2015-08-29 NOTE — Anesthesia Procedure Notes (Signed)
Procedure Name: Intubation Date/Time: 08/29/2015 7:34 AM Performed by: Neldon Newport Pre-anesthesia Checklist: Patient being monitored, Suction available, Emergency Drugs available, Patient identified and Timeout performed Patient Re-evaluated:Patient Re-evaluated prior to inductionOxygen Delivery Method: Circle system utilized Preoxygenation: Pre-oxygenation with 100% oxygen Intubation Type: IV induction Ventilation: Mask ventilation without difficulty Laryngoscope Size: Mac and 3 Grade View: Grade I Tube type: Oral Tube size: 8.0 mm Number of attempts: 1 Placement Confirmation: positive ETCO2,  ETT inserted through vocal cords under direct vision and breath sounds checked- equal and bilateral Secured at: 23 cm Tube secured with: Tape Dental Injury: Teeth and Oropharynx as per pre-operative assessment

## 2015-08-29 NOTE — Progress Notes (Signed)
Pt extubated and signs of increased work of breathing noted per RT and RN, Dr. Roxan Hockey paged in [place] for orders for Bipap, paged returned and order placed, pt placed on bipap @ 1640. Follow up abg for post extubation was wnl. Will continue to monitor. Sherrie Mustache

## 2015-08-29 NOTE — Brief Op Note (Addendum)
08/22/2015 - 08/29/2015  11:18 AM  PATIENT:  [name]  54 y.o. male  PRE-OPERATIVE DIAGNOSIS:  3 vessel CAD  POST-OPERATIVE DIAGNOSIS:  3 vessel CAD  PROCEDURE:   CORONARY ARTERY BYPASS GRAFTING x 4   LIMA to LAD  SVG- to Ramus  Sequential SVG to PD and PL ENDOSCOPIC GREATER SAPHENOUS VEIN HARVEST RIGHT LEG  SURGEON:  [name], MD  ASSISTANT: Suzzanne Cloud, PA-C  ANESTHESIA:   general  PATIENT CONDITION:  ICU - intubated and hemodynamically stable.  PRE-OPERATIVE WEIGHT: 115 kg  XC= 69 min CPB= 105 min  OM1 and diagonals too small to graft LAD, PD, PL good targets Ramus- 1.0 mm fair target  [name], MD [facility] [phone]

## 2015-08-29 NOTE — Anesthesia Postprocedure Evaluation (Signed)
  Anesthesia Post-op Note  Patient: [name]  Procedure(s) Performed: Procedure(s) (LRB): CORONARY ARTERY BYPASS GRAFTING (CABG) (N/A) TRANSESOPHAGEAL ECHOCARDIOGRAM (TEE) (N/A)  Patient Location: ICU  Anesthesia Type: General  Level of Consciousness: sedated   Airway and Oxygen Therapy: Patient intubated  Post-op Pain: mild  Post-op Assessment: Post-op Vital signs reviewed, Patient's Cardiovascular Status Stable, Respiratory Function Stable, Patent Airway  Last Vitals:  Filed Vitals:   08/29/15 1510  BP: 97/69  Pulse: 93  Temp:   Resp: 16    Post-op Vital Signs: stable   Complications: No apparent anesthesia complications

## 2015-08-29 NOTE — Procedures (Signed)
Extubation Procedure Note  Patient Details:   Name: [name] DOB: 07/25/1961 MRN: [id number]   Airway Documentation:     Evaluation  O2 sats: stable throughout Complications: No apparent complications Patient did tolerate procedure well. Bilateral Breath Sounds: Clear, Diminished Suctioning: Airway Yes  Patient tolerated rapid wean. NIF -20 and VC 0.75 L. Positive for cuff leak. Patient extubated to a 4 Lpm nasal cannula. No signs of stridor or dyspnea. Patient instructed on the Incentive Spirometer, achieving 500 mL, five times. RN at bedside. Will continue to monitor.    Myrtie Neither 08/29/2015, 4:24 PM

## 2015-08-30 ENCOUNTER — Inpatient Hospital Stay (HOSPITAL_COMMUNITY): Payer: Federal, State, Local not specified - PPO

## 2015-08-30 LAB — CBC
HCT: 33.7 % — ABNORMAL LOW (ref 39.0–52.0)
HCT: 32.4 % — ABNORMAL LOW (ref 39.0–52.0)
Hemoglobin: 11.5 g/dL — ABNORMAL LOW (ref 13.0–17.0)
Hemoglobin: 10.8 g/dL — ABNORMAL LOW (ref 13.0–17.0)
MCH: 31.9 pg (ref 26.0–34.0)
MCH: 31.7 pg (ref 26.0–34.0)
MCHC: 34.1 g/dL (ref 30.0–36.0)
MCHC: 33.3 g/dL (ref 30.0–36.0)
MCV: 93.6 fL (ref 78.0–100.0)
MCV: 95 fL (ref 78.0–100.0)
Platelets: 164 10*3/uL (ref 150–400)
Platelets: 129 10*3/uL — ABNORMAL LOW (ref 150–400)
RBC: 3.6 MIL/uL — ABNORMAL LOW (ref 4.22–5.81)
RBC: 3.41 MIL/uL — ABNORMAL LOW (ref 4.22–5.81)
RDW: 13.9 % (ref 11.5–15.5)
RDW: 14.3 % (ref 11.5–15.5)
WBC: 11.8 10*3/uL — ABNORMAL HIGH (ref 4.0–10.5)
WBC: 11 10*3/uL — ABNORMAL HIGH (ref 4.0–10.5)

## 2015-08-30 LAB — PREPARE PLATELET PHERESIS: Unit division: 0

## 2015-08-30 LAB — GLUCOSE, CAPILLARY
Glucose-Capillary: 101 mg/dL — ABNORMAL HIGH (ref 65–99)
Glucose-Capillary: 109 mg/dL — ABNORMAL HIGH (ref 65–99)
Glucose-Capillary: 114 mg/dL — ABNORMAL HIGH (ref 65–99)
Glucose-Capillary: 116 mg/dL — ABNORMAL HIGH (ref 65–99)
Glucose-Capillary: 116 mg/dL — ABNORMAL HIGH (ref 65–99)
Glucose-Capillary: 117 mg/dL — ABNORMAL HIGH (ref 65–99)
Glucose-Capillary: 119 mg/dL — ABNORMAL HIGH (ref 65–99)
Glucose-Capillary: 120 mg/dL — ABNORMAL HIGH (ref 65–99)
Glucose-Capillary: 122 mg/dL — ABNORMAL HIGH (ref 65–99)
Glucose-Capillary: 124 mg/dL — ABNORMAL HIGH (ref 65–99)
Glucose-Capillary: 129 mg/dL — ABNORMAL HIGH (ref 65–99)
Glucose-Capillary: 152 mg/dL — ABNORMAL HIGH (ref 65–99)
Glucose-Capillary: 205 mg/dL — ABNORMAL HIGH (ref 65–99)
Glucose-Capillary: 213 mg/dL — ABNORMAL HIGH (ref 65–99)

## 2015-08-30 LAB — POCT I-STAT, CHEM 8
BUN: 17 mg/dL (ref 6–20)
Calcium, Ion: 1.1 mmol/L — ABNORMAL LOW (ref 1.12–1.23)
Chloride: 102 mmol/L (ref 101–111)
Creatinine, Ser: 1.4 mg/dL — ABNORMAL HIGH (ref 0.61–1.24)
Glucose, Bld: 172 mg/dL — ABNORMAL HIGH (ref 65–99)
HCT: 33 % — ABNORMAL LOW (ref 39.0–52.0)
Hemoglobin: 11.2 g/dL — ABNORMAL LOW (ref 13.0–17.0)
Potassium: 4 mmol/L (ref 3.5–5.1)
Sodium: 138 mmol/L (ref 135–145)
TCO2: 22 mmol/L (ref 0–100)

## 2015-08-30 LAB — BASIC METABOLIC PANEL
Anion gap: 7 (ref 5–15)
BUN: 12 mg/dL (ref 6–20)
CO2: 22 mmol/L (ref 22–32)
Calcium: 7.5 mg/dL — ABNORMAL LOW (ref 8.9–10.3)
Chloride: 108 mmol/L (ref 101–111)
Creatinine, Ser: 1.32 mg/dL — ABNORMAL HIGH (ref 0.61–1.24)
GFR calc Af Amer: >60 mL/min (ref >60)
GFR calc non Af Amer: 59 mL/min — ABNORMAL LOW (ref >60)
Glucose, Bld: 121 mg/dL — ABNORMAL HIGH (ref 65–99)
Potassium: 3.9 mmol/L (ref 3.5–5.1)
Sodium: 137 mmol/L (ref 135–145)

## 2015-08-30 LAB — CREATININE, SERUM
Creatinine, Ser: 1.42 mg/dL — ABNORMAL HIGH (ref 0.61–1.24)
GFR calc Af Amer: >60 mL/min (ref >60)
GFR calc non Af Amer: 54 mL/min — ABNORMAL LOW (ref >60)

## 2015-08-30 LAB — MAGNESIUM
Magnesium: 2.6 mg/dL — ABNORMAL HIGH (ref 1.7–2.4)
Magnesium: 2.3 mg/dL (ref 1.7–2.4)

## 2015-08-30 MED ORDER — GUAIFENESIN ER 600 MG PO TB12
600.0000 mg | ORAL_TABLET | Freq: Two times a day (BID) | ORAL | Status: DC
  Administered 2015-08-30 – 2015-09-01 (×5): 600 mg via ORAL
  Filled 2015-08-30 (×10): qty 1

## 2015-08-30 MED ORDER — INSULIN DETEMIR 100 UNIT/ML ~~LOC~~ SOLN
25.0000 [IU] | Freq: Once | SUBCUTANEOUS | Status: AC
  Administered 2015-08-30: 25 [IU] via SUBCUTANEOUS
  Filled 2015-08-30: qty 0.25

## 2015-08-30 MED ORDER — FUROSEMIDE 10 MG/ML IJ SOLN
40.0000 mg | Freq: Once | INTRAMUSCULAR | Status: AC
  Administered 2015-08-30: 40 mg via INTRAVENOUS

## 2015-08-30 MED ORDER — CHLORHEXIDINE GLUCONATE 0.12 % MT SOLN
OROMUCOSAL | Status: AC
  Administered 2015-08-30: 15 mL
  Filled 2015-08-30: qty 15

## 2015-08-30 MED ORDER — INSULIN ASPART 100 UNIT/ML ~~LOC~~ SOLN
0.0000 [IU] | SUBCUTANEOUS | Status: DC
  Administered 2015-08-30 (×2): 8 [IU] via SUBCUTANEOUS
  Administered 2015-08-30: 2 [IU] via SUBCUTANEOUS
  Administered 2015-08-31: 4 [IU] via SUBCUTANEOUS
  Administered 2015-08-31: 2 [IU] via SUBCUTANEOUS
  Administered 2015-08-31: 8 [IU] via SUBCUTANEOUS
  Administered 2015-08-31: 2 [IU] via SUBCUTANEOUS
  Administered 2015-08-31: 8 [IU] via SUBCUTANEOUS
  Administered 2015-09-01 (×2): 2 [IU] via SUBCUTANEOUS

## 2015-08-30 MED ORDER — INSULIN DETEMIR 100 UNIT/ML ~~LOC~~ SOLN
25.0000 [IU] | Freq: Every day | SUBCUTANEOUS | Status: DC
  Filled 2015-08-30: qty 0.25

## 2015-08-30 NOTE — Progress Notes (Signed)
Patient ID: [name], male   DOB: [birth year], 54 y.o.   MRN: [id number] TCTS DAILY ICU PROGRESS NOTE                   [address]            ,[place] [address]          [phone]   1 Day Post-Op Procedure(s) (LRB): CORONARY ARTERY BYPASS GRAFTING (CABG) (N/A) TRANSESOPHAGEAL ECHOCARDIOGRAM (TEE) (N/A)  Total Length of Stay:  LOS: 7 days   Subjective: Alert and awake, no previous history of sleep apnea  Objective: Vital signs in last 24 hours: Temp:  [97.2 F (36.2 C)-102.6 F (39.2 C)] 100.4 F (38 C) (09/17 0755) Pulse Rate:  [73-93] 77 (09/17 0755) Cardiac Rhythm:  [-] Normal sinus rhythm (09/17 0800) Resp:  [12-27] 22 (09/17 0755) BP: (71-129)/(34-97) 91/54 mmHg (09/16 1910) SpO2:  [91 %-100 %] 96 % (09/17 0755) Arterial Line BP: (78-160)/(45-76) 102/52 mmHg (09/17 0755) FiO2 (%):  [40 %-60 %] 40 % (09/16 2000) Weight:  [262 lb 12.6 oz (119.2 kg)] 262 lb 12.6 oz (119.2 kg) (09/17 0500)  Filed Weights   08/28/15 0614 08/29/15 0500 08/30/15 0500  Weight: 255 lb 1.2 oz (115.7 kg) 252 lb 10.4 oz (114.6 kg) 262 lb 12.6 oz (119.2 kg)    Weight change: 10 lb 2.3 oz (4.6 kg)   Hemodynamic parameters for last 24 hours: PAP: (16-51)/(7-37) 39/19 mmHg CO:  [3.9 L/min-7.9 L/min] 6.7 L/min CI:  [1.7 L/min/m2-3.4 L/min/m2] 2.9 L/min/m2  Intake/Output from previous day: 09/16 0701 - 09/17 0700 In: 5905.3 [P.O.:240; I.V.:4089.3; Blood:596; NG/GT:30; IV Piggyback:950] Out: 5480 [Urine:3615; Blood:1625; Chest Tube:240]  Intake/Output this shift: Total I/O In: -  Out: 110 [Urine:60; Chest Tube:50]  Current Meds: Scheduled Meds: . acetaminophen  1,000 mg Oral 4 times per day   Or  . acetaminophen (TYLENOL) oral liquid 160 mg/5 mL  1,000 mg Per Tube 4 times per day  . antiseptic oral rinse  7 mL Mouth Rinse QID  . aspirin EC  325 mg Oral Daily   Or  . aspirin  324 mg Per Tube Daily  . atorvastatin  80 mg Oral q1800  . bisacodyl  10 mg Oral  Daily   Or  . bisacodyl  10 mg Rectal Daily  . cefUROXime (ZINACEF)  IV  1.5 g Intravenous Q12H  . chlorhexidine      . chlorhexidine gluconate  15 mL Mouth Rinse BID  . docusate sodium  200 mg Oral Daily  . insulin regular  0-10 Units Intravenous TID WC  . metoprolol tartrate  12.5 mg Oral BID   Or  . metoprolol tartrate  12.5 mg Per Tube BID  . [START ON 08/31/2015] pantoprazole  40 mg Oral Daily  . sodium chloride  3 mL Intravenous Q12H   Continuous Infusions: . sodium chloride 10 mL/hr at 08/30/15 0400  . sodium chloride 250 mL (08/29/15 1349)  . sodium chloride 10 mL/hr at 08/29/15 1900  . dexmedetomidine Stopped (08/30/15 0400)  . insulin (NOVOLIN-R) infusion 1.6 Units/hr (08/30/15 1000)  . lactated ringers 10 mL/hr at 08/30/15 0400  . lactated ringers    . nitroGLYCERIN    . phenylephrine (NEO-SYNEPHRINE) Adult infusion 55 mcg/min (08/30/15 0845)   PRN Meds:.sodium chloride, albumin human, Gerhardt's butt cream, lactated ringers, metoprolol, midazolam, morphine injection, ondansetron (ZOFRAN) IV, oxyCODONE, sodium chloride, traMADol  General appearance: alert and cooperative Neurologic: intact Heart: regular rate and rhythm,  S1, S2 normal, no murmur, click, rub or gallop Lungs: diminished breath sounds bibasilar Abdomen: soft, non-tender; bowel sounds normal; no masses,  no organomegaly Extremities: extremities normal, atraumatic, no cyanosis or edema and Homans sign is negative, no sign of DVT Wound: sternum stable  Lab Results: CBC: Recent Labs  08/29/15 1855 08/29/15 1857 08/30/15 0400  WBC 8.3  --  11.8*  HGB 12.2* 11.9* 11.5*  HCT 35.6* 35.0* 33.7*  PLT 138*  --  164   BMET:  Recent Labs  08/29/15 0521  08/29/15 1857 08/30/15 0400  NA 137  < > 140 137  K 3.6  < > 4.1 3.9  CL 108  < > 108 108  CO2 20*  --   --  22  GLUCOSE 138*  < > 116* 121*  BUN 13  < > 10 12  CREATININE 1.07  < > 1.00 1.32*  CALCIUM 8.2*  --   --  7.5*  < > = values in this  interval not displayed.  PT/INR:  Recent Labs  08/29/15 1320  LABPROT 17.4*  INR 1.41   Radiology: Dg Chest Port 1 View  08/30/2015   CLINICAL DATA:  Coronary artery bypass surgery.  EXAM: PORTABLE CHEST - 1 VIEW  COMPARISON:  08/29/2015  FINDINGS: The endotracheal tube has been removed. The NG tube is been removed. The Swan-Ganz catheter is stable. The left chest tube is stable no pneumothorax. Stable cardiac enlargement and prominent mediastinal contours with persistent perihilar atelectasis. Suspect a small left pleural effusion and left basilar atelectasis.  IMPRESSION: Removal of ETT and NG tube.  The Swan-Ganz catheter is stable.  Stable left-sided chest tube without pneumothorax.  Persistent perihilar and left basilar atelectasis and possible small left effusion.   Electronically Signed   By: Marijo Sanes M.D.   On: 08/30/2015 09:15   Dg Chest Port 1 View  08/29/2015   CLINICAL DATA:  Status post coronary artery bypass graft x4.  EXAM: PORTABLE CHEST - 1 VIEW  COMPARISON:  August 22, 2015.  FINDINGS: Endotracheal tube is in grossly good position with distal tip 3 cm above the carina. Left-sided chest tube is noted without pneumothorax. Nasogastric tube is seen entering the stomach. Right internal jugular Swan-Ganz catheter is noted with distal tip in expected position of main pulmonary artery. Mild central pulmonary vascular congestion is noted.  IMPRESSION: Endotracheal tube in grossly good position. Left-sided chest tube is noted without pneumothorax.   Electronically Signed   By: Marijo Conception, M.D.   On: 08/29/2015 13:31     Assessment/Plan: S/P Procedure(s) (LRB): CORONARY ARTERY BYPASS GRAFTING (CABG) (N/A) TRANSESOPHAGEAL ECHOCARDIOGRAM (TEE) (N/A) Mobilize Diuresis Diabetes control d/c tubes/lines Continue foley due to strict I&O, patient in ICU and urinary output monitoring See progression orders Poor preop control  Of DM Expected Acute  Blood - loss  Anemia    [name] 08/30/2015 10:27 AM

## 2015-08-30 NOTE — Progress Notes (Signed)
Patient is on 4L [place] tolerating well. Patient wants to leave [place] in tonight. No bipap tonight. Patient sat 96% on 4L. Patient in no distress at this time. RT will continue to monitor.

## 2015-08-30 NOTE — Progress Notes (Signed)
Patient ID: [name], male   DOB: 1961/08/29, 54 y.o.   MRN: [id number] EVENING ROUNDS NOTE :     [address]       [place] [address]             [phone]                 1 Day Post-Op Procedure(s) (LRB): CORONARY ARTERY BYPASS GRAFTING (CABG) (N/A) TRANSESOPHAGEAL ECHOCARDIOGRAM (TEE) (N/A)  Total Length of Stay:  LOS: 7 days  BP 101/69 mmHg  Pulse 84  Temp(Src) 99.1 F (37.3 C) (Oral)  Resp 20  Ht 5\' 10"  (1.778 m)  Wt 262 lb 12.6 oz (119.2 kg)  BMI 37.71 kg/m2  SpO2 96%  .Intake/Output      09/17 0701 - 09/18 0700   P.O.    I.V. (mL/kg) 411 (3.4)   Blood    NG/GT    IV Piggyback 50   Total Intake(mL/kg) 461 (3.9)   Urine (mL/kg/hr) 1150 (0.8)   Blood    Chest Tube 100 (0.1)   Total Output 1250   Net -789         . sodium chloride Stopped (08/30/15 0900)  . sodium chloride 250 mL (08/29/15 1349)  . sodium chloride Stopped (08/30/15 0900)  . dexmedetomidine Stopped (08/30/15 0400)  . lactated ringers 10 mL/hr at 08/30/15 0400  . lactated ringers    . nitroGLYCERIN    . phenylephrine (NEO-SYNEPHRINE) Adult infusion 10 mcg/min (08/30/15 1800)     Lab Results  Component Value Date   WBC 11.0* 08/30/2015   HGB 10.8* 08/30/2015   HCT 32.4* 08/30/2015   PLT 129* 08/30/2015   GLUCOSE 172* 08/30/2015   ALT 29 08/23/2015   AST 71* 08/23/2015   NA 138 08/30/2015   K 4.0 08/30/2015   CL 102 08/30/2015   CREATININE 1.42* 08/30/2015   BUN 17 08/30/2015   CO2 22 08/30/2015   INR 1.41 08/29/2015   HGBA1C 9.3* 08/24/2015   Stable day Watch cr 1.42   [name] MD  Beeper [phone] Office [phone] 08/30/2015 7:17 PM

## 2015-08-30 NOTE — Progress Notes (Signed)
Patient is on 4L [place] tolerating well. Sat 97%. RR normal 19. Bipap is not needed at this time. RT will continue to monitor.

## 2015-08-30 NOTE — Plan of Care (Signed)
Problem: Phase II - Intermediate Post-Op Goal: Wean to Extubate Outcome: Completed/Met Date Met:  08/30/15 Rapid wean perimeters met and Pt weaned to extubate after 3 hrs, able to perform pulm.toilet when prompted. Pt developed significant D.O.E. requiring PRN Bipap. Goal: CBGs/Blood Glucose per SCIP Criteria Outcome: Completed/Met Date Met:  08/30/15 Pt transitioned off insulin drip to Q4hr SSI CBGs and levemir. Per SCIP protocol.     

## 2015-08-31 ENCOUNTER — Inpatient Hospital Stay (HOSPITAL_COMMUNITY): Payer: Federal, State, Local not specified - PPO

## 2015-08-31 LAB — BASIC METABOLIC PANEL
Anion gap: 7 (ref 5–15)
BUN: 16 mg/dL (ref 6–20)
CO2: 24 mmol/L (ref 22–32)
Calcium: 7.6 mg/dL — ABNORMAL LOW (ref 8.9–10.3)
Chloride: 106 mmol/L (ref 101–111)
Creatinine, Ser: 1.38 mg/dL — ABNORMAL HIGH (ref 0.61–1.24)
GFR calc Af Amer: >60 mL/min (ref >60)
GFR calc non Af Amer: 56 mL/min — ABNORMAL LOW (ref >60)
Glucose, Bld: 140 mg/dL — ABNORMAL HIGH (ref 65–99)
Potassium: 3.8 mmol/L (ref 3.5–5.1)
Sodium: 137 mmol/L (ref 135–145)

## 2015-08-31 LAB — CBC
HCT: 28.7 % — ABNORMAL LOW (ref 39.0–52.0)
Hemoglobin: 9.7 g/dL — ABNORMAL LOW (ref 13.0–17.0)
MCH: 31.9 pg (ref 26.0–34.0)
MCHC: 33.8 g/dL (ref 30.0–36.0)
MCV: 94.4 fL (ref 78.0–100.0)
Platelets: 97 10*3/uL — ABNORMAL LOW (ref 150–400)
RBC: 3.04 MIL/uL — ABNORMAL LOW (ref 4.22–5.81)
RDW: 14.2 % (ref 11.5–15.5)
WBC: 9.5 10*3/uL (ref 4.0–10.5)

## 2015-08-31 LAB — GLUCOSE, CAPILLARY
Glucose-Capillary: 129 mg/dL — ABNORMAL HIGH (ref 65–99)
Glucose-Capillary: 149 mg/dL — ABNORMAL HIGH (ref 65–99)
Glucose-Capillary: 245 mg/dL — ABNORMAL HIGH (ref 65–99)
Glucose-Capillary: 221 mg/dL — ABNORMAL HIGH (ref 65–99)
Glucose-Capillary: 186 mg/dL — ABNORMAL HIGH (ref 65–99)

## 2015-08-31 MED ORDER — LEVALBUTEROL HCL 0.63 MG/3ML IN NEBU
0.6300 mg | INHALATION_SOLUTION | Freq: Four times a day (QID) | RESPIRATORY_TRACT | Status: DC
  Administered 2015-08-31 – 2015-09-02 (×8): 0.63 mg via RESPIRATORY_TRACT
  Filled 2015-08-31 (×16): qty 3

## 2015-08-31 MED ORDER — ACETYLCYSTEINE 10 % IN SOLN: 2.0000 mL | Freq: Four times a day (QID) | RESPIRATORY_TRACT | Status: DC

## 2015-08-31 MED ORDER — ACETYLCYSTEINE 20 % IN SOLN
2.0000 mL | Freq: Four times a day (QID) | RESPIRATORY_TRACT | Status: DC
  Administered 2015-08-31 – 2015-09-02 (×7): 2 mL via RESPIRATORY_TRACT
  Filled 2015-08-31 (×12): qty 4

## 2015-08-31 MED ORDER — INSULIN DETEMIR 100 UNIT/ML ~~LOC~~ SOLN
35.0000 [IU] | Freq: Every day | SUBCUTANEOUS | Status: DC
  Administered 2015-08-31 – 2015-09-04 (×5): 35 [IU] via SUBCUTANEOUS
  Filled 2015-08-31 (×5): qty 0.35

## 2015-08-31 MED ORDER — FUROSEMIDE 10 MG/ML IJ SOLN
40.0000 mg | Freq: Once | INTRAMUSCULAR | Status: AC
  Administered 2015-08-31: 40 mg via INTRAVENOUS

## 2015-08-31 MED ORDER — ACETYLCYSTEINE 20 % IN SOLN
2.0000 mL | Freq: Three times a day (TID) | RESPIRATORY_TRACT | Status: DC
  Filled 2015-08-31 (×3): qty 4

## 2015-08-31 MED ORDER — FUROSEMIDE 10 MG/ML IJ SOLN
40.0000 mg | Freq: Two times a day (BID) | INTRAMUSCULAR | Status: DC
  Administered 2015-08-31 – 2015-09-02 (×4): 40 mg via INTRAVENOUS
  Filled 2015-08-31 (×7): qty 4

## 2015-08-31 NOTE — Progress Notes (Signed)
Ambulated patient in the unit.  Pt ambulated with nasal cannula at 4l n/c.  HR did elevate and pt did take a few rests, however, sats maintained.  Once pt was sitting in his chair, sats on the monitor were 55%.  RT was called and placed pt on bipap at 100%.  Pt initially had sats in the mid 80s which did eventually rise to 100%.  Audible breath sounds bilat., however, diminished.  Dr Servando Snare was in the unit rounding and came to evaluate the pt.  Breathing treatments and lasix orders are being modified.  Will continue to monitor.

## 2015-08-31 NOTE — Progress Notes (Signed)
Patient ID: [name], male   DOB: 1961-03-09, 54 y.o.   MRN: [id number] TCTS DAILY ICU PROGRESS NOTE                   [address]            [place] [address]          [phone]   2 Days Post-Op Procedure(s) (LRB): CORONARY ARTERY BYPASS GRAFTING (CABG) (N/A) TRANSESOPHAGEAL ECHOCARDIOGRAM (TEE) (N/A)  Total Length of Stay:  LOS: 8 days   Subjective: Poor cough effort  Objective: Vital signs in last 24 hours: Temp:  [98.7 F (37.1 C)-101.7 F (38.7 C)] 99.2 F (37.3 C) (09/18 0804) Pulse Rate:  [69-101] 96 (09/18 0700) Cardiac Rhythm:  [-] Normal sinus rhythm (09/17 2000) Resp:  [13-29] 19 (09/18 0700) BP: (86-137)/(45-115) 137/115 mmHg (09/18 0700) SpO2:  [92 %-100 %] 98 % (09/18 0700) Arterial Line BP: (67-95)/(55-57) 95/57 mmHg (09/17 0900) FiO2 (%):  [2 %] 2 % (09/17 2000)  Filed Weights   08/28/15 0614 08/29/15 0500 08/30/15 0500  Weight: 255 lb 1.2 oz (115.7 kg) 252 lb 10.4 oz (114.6 kg) 262 lb 12.6 oz (119.2 kg)    Weight change:    Hemodynamic parameters for last 24 hours:    Intake/Output from previous day: 09/17 0701 - 09/18 0700 In: 1629.9 [P.O.:1020; I.V.:559.9; IV Piggyback:50] Out: 1595 [Urine:1495; Chest Tube:100]  Intake/Output this shift:    Current Meds: Scheduled Meds: . acetaminophen  1,000 mg Oral 4 times per day   Or  . acetaminophen (TYLENOL) oral liquid 160 mg/5 mL  1,000 mg Per Tube 4 times per day  . antiseptic oral rinse  7 mL Mouth Rinse QID  . aspirin EC  325 mg Oral Daily   Or  . aspirin  324 mg Per Tube Daily  . atorvastatin  80 mg Oral q1800  . bisacodyl  10 mg Oral Daily   Or  . bisacodyl  10 mg Rectal Daily  . cefUROXime (ZINACEF)  IV  1.5 g Intravenous Q12H  . chlorhexidine gluconate  15 mL Mouth Rinse BID  . docusate sodium  200 mg Oral Daily  . guaiFENesin  600 mg Oral BID  . insulin aspart  0-24 Units Subcutaneous 6 times per day  . insulin detemir  25 Units Subcutaneous Daily  .  metoprolol tartrate  12.5 mg Oral BID   Or  . metoprolol tartrate  12.5 mg Per Tube BID  . pantoprazole  40 mg Oral Daily  . sodium chloride  3 mL Intravenous Q12H   Continuous Infusions: . sodium chloride Stopped (08/30/15 0900)  . sodium chloride 250 mL (08/29/15 1349)  . sodium chloride Stopped (08/30/15 0900)  . dexmedetomidine Stopped (08/30/15 0400)  . lactated ringers 10 mL/hr at 08/30/15 0400  . lactated ringers    . nitroGLYCERIN    . phenylephrine (NEO-SYNEPHRINE) Adult infusion Stopped (08/31/15 0100)   PRN Meds:.sodium chloride, Gerhardt's butt cream, lactated ringers, metoprolol, midazolam, morphine injection, ondansetron (ZOFRAN) IV, oxyCODONE, sodium chloride, traMADol  General appearance: alert, cooperative and no distress Neurologic: intact Heart: regular rate and rhythm, S1, S2 normal, no murmur, click, rub or gallop Lungs: rhonchi LUL Abdomen: soft, non-tender; bowel sounds normal; no masses,  no organomegaly Extremities: extremities normal, atraumatic, no cyanosis or edema and Homans sign is negative, no sign of DVT Wound: sternum stable  Lab Results: CBC: Recent Labs  08/30/15 1716 08/31/15 0412  WBC 11.0* 9.5  HGB  10.8* 9.7*  HCT 32.4* 28.7*  PLT 129* 97*   BMET:  Recent Labs  08/30/15 0400 08/30/15 1710 08/30/15 1716 08/31/15 0412  NA 137 138  --  137  K 3.9 4.0  --  3.8  CL 108 102  --  106  CO2 22  --   --  24  GLUCOSE 121* 172*  --  140*  BUN 12 17  --  16  CREATININE 1.32* 1.40* 1.42* 1.38*  CALCIUM 7.5*  --   --  7.6*    PT/INR:  Recent Labs  08/29/15 1320  LABPROT 17.4*  INR 1.41   Radiology: No results found.   Assessment/Plan: S/P Procedure(s) (LRB): CORONARY ARTERY BYPASS GRAFTING (CABG) (N/A) TRANSESOPHAGEAL ECHOCARDIOGRAM (TEE) (N/A) Mobilize Diuresis Diabetes control increase insulin  Mild thrombocytopenia, avoid heparin, pas hose on Work on pulmonary toilet Renal function stable  [name] 08/31/2015  8:39 AM

## 2015-08-31 NOTE — Progress Notes (Signed)
Patient placed on BIPAP QHS on 14/6 and 70%/ Patient tolerating well sat 97%. Patient in no distress at this time. RT will continue to monitor as needed.

## 2015-08-31 NOTE — Progress Notes (Signed)
Patient ID: [name], male   DOB: 1961-09-06, 54 y.o.   MRN: [id number] EVENING ROUNDS NOTE :     [address]       [place] [address]             [phone]                 2 Days Post-Op Procedure(s) (LRB): CORONARY ARTERY BYPASS GRAFTING (CABG) (N/A) TRANSESOPHAGEAL ECHOCARDIOGRAM (TEE) (N/A)  Total Length of Stay:  LOS: 8 days  BP 119/65 mmHg  Pulse 101  Temp(Src) 98.3 F (36.8 C) (Oral)  Resp 18  Ht 5\' 10"  (1.778 m)  Wt 262 lb 12.6 oz (119.2 kg)  BMI 37.71 kg/m2  SpO2 100%  .Intake/Output      09/17 0701 - 09/18 0700 09/18 0701 - 09/19 0700   P.O. 1020 60   I.V. (mL/kg) 559.9 (4.7) 100 (0.8)   Blood     NG/GT     IV Piggyback 50    Total Intake(mL/kg) 1629.9 (13.7) 160 (1.3)   Urine (mL/kg/hr) 1495 (0.5) 555 (0.4)   Blood     Chest Tube 100 (0)    Total Output 1595 555   Net +34.9 -395          . sodium chloride Stopped (08/30/15 0900)  . sodium chloride 250 mL (08/29/15 1349)  . sodium chloride Stopped (08/30/15 0900)  . lactated ringers 10 mL/hr at 08/30/15 0400  . lactated ringers    . nitroGLYCERIN    . phenylephrine (NEO-SYNEPHRINE) Adult infusion Stopped (08/31/15 0100)     Lab Results  Component Value Date   WBC 9.5 08/31/2015   HGB 9.7* 08/31/2015   HCT 28.7* 08/31/2015   PLT 97* 08/31/2015   GLUCOSE 140* 08/31/2015   ALT 29 08/23/2015   AST 71* 08/23/2015   NA 137 08/31/2015   K 3.8 08/31/2015   CL 106 08/31/2015   CREATININE 1.38* 08/31/2015   BUN 16 08/31/2015   CO2 24 08/31/2015   INR 1.41 08/29/2015   HGBA1C 9.3* 08/24/2015   Off bipap  now, increased pulmonary ltoilet  [name] MD  Beeper [phone] Office [phone] 08/31/2015 5:48 PM

## 2015-08-31 NOTE — Progress Notes (Signed)
Called to assess pt by RN, upon arrival, pt Spo2 63%, RN had turned [place] up to 10L. Placed pt on BIPAP 14/6 fio2 100%. Pt tolerating well, Sitting in chair on BIPAP. Spo2 now at 1110 100%. RT will continue to monitor.

## 2015-09-01 ENCOUNTER — Inpatient Hospital Stay (HOSPITAL_COMMUNITY): Payer: Federal, State, Local not specified - PPO

## 2015-09-01 ENCOUNTER — Encounter (HOSPITAL_COMMUNITY): Payer: Self-pay | Admitting: Thoracic Surgery (Cardiothoracic Vascular Surgery)

## 2015-09-01 LAB — CBC
HCT: 28.6 % — ABNORMAL LOW (ref 39.0–52.0)
Hemoglobin: 9.6 g/dL — ABNORMAL LOW (ref 13.0–17.0)
MCH: 31.8 pg (ref 26.0–34.0)
MCHC: 33.6 g/dL (ref 30.0–36.0)
MCV: 94.7 fL (ref 78.0–100.0)
Platelets: 115 10*3/uL — ABNORMAL LOW (ref 150–400)
RBC: 3.02 MIL/uL — ABNORMAL LOW (ref 4.22–5.81)
RDW: 14.1 % (ref 11.5–15.5)
WBC: 8.9 10*3/uL (ref 4.0–10.5)

## 2015-09-01 LAB — BASIC METABOLIC PANEL
Anion gap: 7 (ref 5–15)
BUN: 19 mg/dL (ref 6–20)
CO2: 27 mmol/L (ref 22–32)
Calcium: 8 mg/dL — ABNORMAL LOW (ref 8.9–10.3)
Chloride: 102 mmol/L (ref 101–111)
Creatinine, Ser: 1.21 mg/dL (ref 0.61–1.24)
GFR calc Af Amer: >60 mL/min (ref >60)
GFR calc non Af Amer: >60 mL/min (ref >60)
Glucose, Bld: 127 mg/dL — ABNORMAL HIGH (ref 65–99)
Potassium: 3.5 mmol/L (ref 3.5–5.1)
Sodium: 136 mmol/L (ref 135–145)

## 2015-09-01 LAB — GLUCOSE, CAPILLARY
Glucose-Capillary: 114 mg/dL — ABNORMAL HIGH (ref 65–99)
Glucose-Capillary: 132 mg/dL — ABNORMAL HIGH (ref 65–99)
Glucose-Capillary: 134 mg/dL — ABNORMAL HIGH (ref 65–99)
Glucose-Capillary: 157 mg/dL — ABNORMAL HIGH (ref 65–99)
Glucose-Capillary: 165 mg/dL — ABNORMAL HIGH (ref 65–99)
Glucose-Capillary: 253 mg/dL — ABNORMAL HIGH (ref 65–99)

## 2015-09-01 MED ORDER — POTASSIUM CHLORIDE CRYS ER 20 MEQ PO TBCR
20.0000 meq | EXTENDED_RELEASE_TABLET | Freq: Two times a day (BID) | ORAL | Status: DC
  Administered 2015-09-01 (×2): 20 meq via ORAL
  Filled 2015-09-01 (×4): qty 1

## 2015-09-01 MED ORDER — CETYLPYRIDINIUM CHLORIDE 0.05 % MT LIQD
7.0000 mL | Freq: Two times a day (BID) | OROMUCOSAL | Status: DC
  Administered 2015-09-01 – 2015-09-02 (×2): 7 mL via OROMUCOSAL

## 2015-09-01 MED ORDER — GERHARDT'S BUTT CREAM: TOPICAL_CREAM | CUTANEOUS | Status: DC | PRN

## 2015-09-01 MED ORDER — LORATADINE 10 MG PO TABS
10.0000 mg | ORAL_TABLET | Freq: Every day | ORAL | Status: DC
  Administered 2015-09-01 – 2015-09-04 (×4): 10 mg via ORAL
  Filled 2015-09-01 (×4): qty 1

## 2015-09-01 MED ORDER — INSULIN ASPART 100 UNIT/ML ~~LOC~~ SOLN
0.0000 [IU] | Freq: Three times a day (TID) | SUBCUTANEOUS | Status: DC
  Administered 2015-09-01: 11 [IU] via SUBCUTANEOUS
  Administered 2015-09-01: 2 [IU] via SUBCUTANEOUS
  Administered 2015-09-02 (×2): 4 [IU] via SUBCUTANEOUS
  Administered 2015-09-02: 7 [IU] via SUBCUTANEOUS
  Administered 2015-09-03 (×2): 4 [IU] via SUBCUTANEOUS
  Administered 2015-09-03: 3 [IU] via SUBCUTANEOUS
  Administered 2015-09-04: 4 [IU] via SUBCUTANEOUS
  Administered 2015-09-04: 3 [IU] via SUBCUTANEOUS

## 2015-09-01 MED ORDER — POTASSIUM CHLORIDE 10 MEQ/50ML IV SOLN
10.0000 meq | INTRAVENOUS | Status: AC
  Administered 2015-09-01 (×3): 10 meq via INTRAVENOUS
  Filled 2015-09-01 (×3): qty 50

## 2015-09-01 MED ORDER — ENOXAPARIN SODIUM 40 MG/0.4ML ~~LOC~~ SOLN
40.0000 mg | SUBCUTANEOUS | Status: DC
  Administered 2015-09-01 – 2015-09-03 (×3): 40 mg via SUBCUTANEOUS
  Filled 2015-09-01 (×4): qty 0.4

## 2015-09-01 MED ORDER — INSULIN ASPART 100 UNIT/ML ~~LOC~~ SOLN
6.0000 [IU] | Freq: Three times a day (TID) | SUBCUTANEOUS | Status: DC
  Administered 2015-09-01 – 2015-09-04 (×8): 6 [IU] via SUBCUTANEOUS

## 2015-09-01 NOTE — Progress Notes (Signed)
TCTS BRIEF SICU PROGRESS NOTE  3 Days Post-Op  S/P Procedure(s) (LRB): CORONARY ARTERY BYPASS GRAFTING (CABG) (N/A) TRANSESOPHAGEAL ECHOCARDIOGRAM (TEE) (N/A)   Stable day NSR w/ stable BP O2 sats 98-100% on 3 L/min Diuresing well  Plan: Continue current plan  Rexene Alberts 09/01/2015 6:11 PM

## 2015-09-01 NOTE — Care Management Note (Signed)
Case Management Note  Patient Details  Name: [name] MRN: [id number] Date of Birth: 26-Jul-1961  Subjective/Objective:      Pt lives with spouse who works but plans to take FMLA to provide 24/7 assistance when pt medically stable for discharge.              Action/Plan: Continue to follow for d/c needs           Expected Discharge Plan:  [place]  Discharge planning Services  CM Consult  [place], Kym Groom, [place] 09/01/2015, 10:01 AM

## 2015-09-01 NOTE — Progress Notes (Signed)
3 Days Post-Op Procedure(s) (LRB): CORONARY ARTERY BYPASS GRAFTING (CABG) (N/A) TRANSESOPHAGEAL ECHOCARDIOGRAM (TEE) (N/A) Subjective: C/o cough, denies pain  Objective: Vital signs in last 24 hours: Temp:  [97.3 F (36.3 C)-99.3 F (37.4 C)] 97.3 F (36.3 C) (09/19 0418) Pulse Rate:  [81-119] 85 (09/19 0800) Cardiac Rhythm:  [-] Normal sinus rhythm (09/19 0800) Resp:  [13-25] 14 (09/19 0800) BP: (87-135)/(32-102) 105/68 mmHg (09/19 0800) SpO2:  [93 %-100 %] 97 % (09/19 0805) FiO2 (%):  [50 %-100 %] 50 % (09/19 0400) Weight:  [259 lb 11.2 oz (117.8 kg)] 259 lb 11.2 oz (117.8 kg) (09/19 0600)  Hemodynamic parameters for last 24 hours:    Intake/Output from previous day: 09/18 0701 - 09/19 0700 In: 780 [P.O.:540; I.V.:140; IV Piggyback:100] Out: 2040 [Urine:2040] Intake/Output this shift: Total I/O In: 50 [IV Piggyback:50] Out: 65 [Urine:65]  General appearance: alert, cooperative and no distress Neurologic: intact Heart: regular rate and rhythm Lungs: diminished breath sounds bilaterally Abdomen: obese, nontender  Lab Results:  Recent Labs  08/31/15 0412 09/01/15 0414  WBC 9.5 8.9  HGB 9.7* 9.6*  HCT 28.7* 28.6*  PLT 97* 115*   BMET:  Recent Labs  08/31/15 0412 09/01/15 0414  NA 137 136  K 3.8 3.5  CL 106 102  CO2 24 27  GLUCOSE 140* 127*  BUN 16 19  CREATININE 1.38* 1.21  CALCIUM 7.6* 8.0*    PT/INR:  Recent Labs  08/29/15 1320  LABPROT 17.4*  INR 1.41   ABG    Component Value Date/Time   PHART 7.373 08/29/2015 1718   HCO3 21.7 08/29/2015 1718   TCO2 22 08/30/2015 1710   ACIDBASEDEF 3.0* 08/29/2015 1718   O2SAT 97.0 08/29/2015 1718   CBG (last 3)   Recent Labs  08/31/15 1953 09/01/15 0007 09/01/15 0411  GLUCAP 186* 157* 114*    Assessment/Plan: S/P Procedure(s) (LRB): CORONARY ARTERY BYPASS GRAFTING (CABG) (N/A) TRANSESOPHAGEAL ECHOCARDIOGRAM (TEE) (N/A) -  CV- stable  RESP- primary issue. Chest xray shows bibasilar  atelectasis. Hardly moves any air on exam  Persistent mild nonproductive cough  Continue xopenex, IS, flutter, BIPAP PRN  RENAL- creatinine better. Supplement K, continue diuresis  ENDO- CBG elevated, add meal coverage  Thrombocytopenia- better this AM, will add enoxaparin for DVT prophylaxis due to limited mobility  Deconditioning- mobilize as tolerated   LOS: 9 days    [name] 09/01/2015

## 2015-09-01 NOTE — Progress Notes (Signed)
K+= 3.5 and creat= 1.21 w/ urine o/p > 30cc/hr; TCTS KCL protocol initiated with 10 mEq KCL in 50cc IV x 3, each over one hour.

## 2015-09-02 ENCOUNTER — Inpatient Hospital Stay (HOSPITAL_COMMUNITY): Payer: Federal, State, Local not specified - PPO

## 2015-09-02 LAB — CBC
HCT: 31.8 % — ABNORMAL LOW (ref 39.0–52.0)
Hemoglobin: 10.4 g/dL — ABNORMAL LOW (ref 13.0–17.0)
MCH: 31 pg (ref 26.0–34.0)
MCHC: 32.7 g/dL (ref 30.0–36.0)
MCV: 94.9 fL (ref 78.0–100.0)
Platelets: 170 10*3/uL (ref 150–400)
RBC: 3.35 MIL/uL — ABNORMAL LOW (ref 4.22–5.81)
RDW: 13.8 % (ref 11.5–15.5)
WBC: 8 10*3/uL (ref 4.0–10.5)

## 2015-09-02 LAB — GLUCOSE, CAPILLARY
Glucose-Capillary: 180 mg/dL — ABNORMAL HIGH (ref 65–99)
Glucose-Capillary: 216 mg/dL — ABNORMAL HIGH (ref 65–99)
Glucose-Capillary: 166 mg/dL — ABNORMAL HIGH (ref 65–99)
Glucose-Capillary: 191 mg/dL — ABNORMAL HIGH (ref 65–99)

## 2015-09-02 LAB — BASIC METABOLIC PANEL
Anion gap: 11 (ref 5–15)
BUN: 21 mg/dL — ABNORMAL HIGH (ref 6–20)
CO2: 27 mmol/L (ref 22–32)
Calcium: 8.1 mg/dL — ABNORMAL LOW (ref 8.9–10.3)
Chloride: 98 mmol/L — ABNORMAL LOW (ref 101–111)
Creatinine, Ser: 1.21 mg/dL (ref 0.61–1.24)
GFR calc Af Amer: >60 mL/min (ref >60)
GFR calc non Af Amer: >60 mL/min (ref >60)
Glucose, Bld: 143 mg/dL — ABNORMAL HIGH (ref 65–99)
Potassium: 3.8 mmol/L (ref 3.5–5.1)
Sodium: 136 mmol/L (ref 135–145)

## 2015-09-02 MED ORDER — MOVING RIGHT ALONG BOOK
Freq: Once | Status: AC
  Administered 2015-09-02: 1
  Filled 2015-09-02: qty 1

## 2015-09-02 MED ORDER — ZOLPIDEM TARTRATE 5 MG PO TABS: 5.0000 mg | ORAL_TABLET | Freq: Every evening | ORAL | Status: DC | PRN

## 2015-09-02 MED ORDER — SAXAGLIPTIN-METFORMIN ER 2.5-1000 MG PO TB24: 1.0000 | ORAL_TABLET | Freq: Two times a day (BID) | ORAL | Status: DC

## 2015-09-02 MED ORDER — HYDROCOD POLST-CPM POLST ER 10-8 MG/5ML PO SUER
5.0000 mL | Freq: Four times a day (QID) | ORAL | Status: DC | PRN
  Administered 2015-09-02: 5 mL via ORAL
  Filled 2015-09-02: qty 5

## 2015-09-02 MED ORDER — LEVALBUTEROL HCL 0.63 MG/3ML IN NEBU
0.6300 mg | INHALATION_SOLUTION | Freq: Four times a day (QID) | RESPIRATORY_TRACT | Status: DC | PRN
Start: 2015-09-02 — End: 2015-09-04

## 2015-09-02 MED ORDER — ALUM & MAG HYDROXIDE-SIMETH 200-200-20 MG/5ML PO SUSP: 15.0000 mL | ORAL | Status: DC | PRN

## 2015-09-02 MED ORDER — GUAIFENESIN ER 600 MG PO TB12
1200.0000 mg | ORAL_TABLET | Freq: Two times a day (BID) | ORAL | Status: DC
  Administered 2015-09-02 – 2015-09-04 (×5): 1200 mg via ORAL
  Filled 2015-09-02 (×4): qty 2

## 2015-09-02 MED ORDER — SODIUM CHLORIDE 0.9 % IV SOLN: 250.0000 mL | INTRAVENOUS | Status: DC | PRN

## 2015-09-02 MED ORDER — POTASSIUM CHLORIDE CRYS ER 20 MEQ PO TBCR
40.0000 meq | EXTENDED_RELEASE_TABLET | Freq: Two times a day (BID) | ORAL | Status: DC
  Administered 2015-09-02 – 2015-09-03 (×4): 40 meq via ORAL
  Filled 2015-09-02 (×3): qty 2

## 2015-09-02 MED ORDER — SODIUM CHLORIDE 0.9 % IJ SOLN
3.0000 mL | Freq: Two times a day (BID) | INTRAMUSCULAR | Status: DC
  Administered 2015-09-02 – 2015-09-04 (×4): 3 mL via INTRAVENOUS

## 2015-09-02 MED ORDER — MAGNESIUM HYDROXIDE 400 MG/5ML PO SUSP: 30.0000 mL | Freq: Every day | ORAL | Status: DC | PRN

## 2015-09-02 MED ORDER — METFORMIN HCL 500 MG PO TABS
1000.0000 mg | ORAL_TABLET | Freq: Two times a day (BID) | ORAL | Status: DC
  Administered 2015-09-02 – 2015-09-04 (×5): 1000 mg via ORAL
  Filled 2015-09-02 (×7): qty 2

## 2015-09-02 MED ORDER — LINAGLIPTIN 5 MG PO TABS
5.0000 mg | ORAL_TABLET | Freq: Every day | ORAL | Status: DC
  Administered 2015-09-02 – 2015-09-04 (×3): 5 mg via ORAL
  Filled 2015-09-02 (×4): qty 1

## 2015-09-02 MED ORDER — FUROSEMIDE 40 MG PO TABS
40.0000 mg | ORAL_TABLET | Freq: Two times a day (BID) | ORAL | Status: DC
  Administered 2015-09-02 – 2015-09-04 (×4): 40 mg via ORAL
  Filled 2015-09-02 (×4): qty 1

## 2015-09-02 MED ORDER — ALPRAZOLAM 0.25 MG PO TABS: 0.2500 mg | ORAL_TABLET | Freq: Four times a day (QID) | ORAL | Status: DC | PRN

## 2015-09-02 MED ORDER — HYDROCHLOROTHIAZIDE 12.5 MG PO CAPS
12.5000 mg | ORAL_CAPSULE | Freq: Every day | ORAL | Status: DC
  Administered 2015-09-02 – 2015-09-04 (×3): 12.5 mg via ORAL
  Filled 2015-09-02 (×3): qty 1

## 2015-09-02 MED ORDER — HYDROCHLOROTHIAZIDE 25 MG PO TABS
12.5000 mg | ORAL_TABLET | Freq: Every day | ORAL | Status: DC
  Filled 2015-09-02 (×3): qty 0.5

## 2015-09-02 MED ORDER — SODIUM CHLORIDE 0.9 % IJ SOLN: 3.0000 mL | INTRAMUSCULAR | Status: DC | PRN

## 2015-09-02 NOTE — Progress Notes (Signed)
Attempted to call report on 2W.  [name]

## 2015-09-02 NOTE — Progress Notes (Signed)
Report called to Doretha Sou, RN on 2W.  [name]

## 2015-09-02 NOTE — Progress Notes (Signed)
4 Days Post-Op Procedure(s) (LRB): CORONARY ARTERY BYPASS GRAFTING (CABG) (N/A) TRANSESOPHAGEAL ECHOCARDIOGRAM (TEE) (N/A) Subjective: Feels better today Still has a nagging cough  Objective: Vital signs in last 24 hours: Temp:  [98.2 F (36.8 C)-98.9 F (37.2 C)] 98.9 F (37.2 C) (09/20 0806) Pulse Rate:  [81-103] 92 (09/20 0700) Cardiac Rhythm:  [-] Normal sinus rhythm (09/20 0800) Resp:  [14-27] 18 (09/20 0700) BP: (89-136)/(64-106) 136/73 mmHg (09/20 0700) SpO2:  [93 %-100 %] 97 % (09/20 0700) FiO2 (%):  [50 %] 50 % (09/20 0004) Weight:  [255 lb 4.7 oz (115.8 kg)] 255 lb 4.7 oz (115.8 kg) (09/20 0600)  Hemodynamic parameters for last 24 hours:    Intake/Output from previous day: 09/19 0701 - 09/20 0700 In: 890 [P.O.:840; IV Piggyback:50] Out: [id number] [Urine:3170] Intake/Output this shift:    General appearance: alert, cooperative and no distress Neurologic: intact Heart: regular rate and rhythm Lungs: wheezes faint bilaterally Abdomen: normal findings: soft, non-tender Wound: clean and dry  Lab Results:  Recent Labs  09/01/15 0414 09/02/15 0402  WBC 8.9 8.0  HGB 9.6* 10.4*  HCT 28.6* 31.8*  PLT 115* 170   BMET:  Recent Labs  09/01/15 0414 09/02/15 0402  NA 136 136  K 3.5 3.8  CL 102 98*  CO2 27 27  GLUCOSE 127* 143*  BUN 19 21*  CREATININE 1.21 1.21  CALCIUM 8.0* 8.1*    PT/INR: No results for input(s): LABPROT, INR in the last 72 hours. ABG    Component Value Date/Time   PHART 7.373 08/29/2015 1718   HCO3 21.7 08/29/2015 1718   TCO2 22 08/30/2015 1710   ACIDBASEDEF 3.0* 08/29/2015 1718   O2SAT 97.0 08/29/2015 1718   CBG (last 3)   Recent Labs  09/01/15 1220 09/01/15 1639 09/01/15 2159  GLUCAP 134* 253* 165*    Assessment/Plan: S/P Procedure(s) (LRB): CORONARY ARTERY BYPASS GRAFTING (CABG) (N/A) TRANSESOPHAGEAL ECHOCARDIOGRAM (TEE) (N/A) Plan for transfer to step-down: see transfer orders   CV- in SR, BP still dips from time to  time  Continue ASA, atorvastatin, lopressor  Resume HCTZ  Restart benazepril when BP higher  RESP_ continue IS, flutter  Change xopenex to PRN  Dc mucomyst, increase mucinex  RENAL- creatinine normal, diuresed 3 L yesterday  Change lasix to PO, dc Foley  ENDO- CBG still elevated in afternoons- continue levemir, meal coverage, resume PO hypoglycemics  Deconditioning- did better with walk today, continue cardiac rehab  SCD + enoxaparin for DVT prophylaxis   LOS: 10 days    [name] 09/02/2015

## 2015-09-02 NOTE — Progress Notes (Signed)
Pt ambulated from 2s05 to 2w23 pushing wheelchair; meds, chart, and pt belongings along with transfer; wife along to accompany transfer; pt tolerated ambulation well; O2 2L in place for ambulation; sats 88% 2L, O2 up to 3L for remainder of walk; sats up to 90% 3L O2 while ambulating; will cont. To monitor.  [name]

## 2015-09-02 NOTE — Op Note (Signed)
NAMEMarland Kitchen  JAYDIEL, HOLTHAUS           ACCOUNT NO.:  [id number]  MEDICAL RECORD NO.:  [id number]  LOCATION:  2S05C                        FACILITY:  [facility]  PHYSICIAN:  Revonda Standard. Roxan Hockey, M.D.DATE OF BIRTH:  04/23/1961  DATE OF PROCEDURE:  08/29/2015 DATE OF DISCHARGE:                              OPERATIVE REPORT   POSTOPERATIVE DIAGNOSIS:  Three-vessel coronary artery disease, status post non-ST segment elevation myocardial infarction.  POSTOPERATIVE DIAGNOSIS:  Three-vessel coronary artery disease, status post non-ST segment elevation myocardial infarction.  PROCEDURE:   Median sternotomy, extracorporeal circulation Coronary artery bypass grafting x 4  Left internal mammary artery to left anterior descending  Saphenous vein graft to ramus intermedius  Sequential saphenous vein graft to posterior descending and posterolateral Endoscopic vein harvest right leg.  SURGEON:  Revonda Standard. Roxan Hockey, M.D.  ASSISTANT:  Suzzanne Cloud, P.A.  ANESTHESIA:  General.  FINDINGS:  LAD, posterior descending and posterolateral- good quality Targets. Ramus intermedius- fair quality target. OM1 and diagonal- too small to graft.  Mammary and saphenous vein good quality.  CLINICAL NOTE:  Mr. Kras is a 54 year old gentleman, who presented with unstable chest pain and ruled in for a non-Q-wave MI. At catheterization he had severe 3-vessel coronary disease.  He initially was stabilized medically with correction of his hyperglycemia and renal insufficiency.  He now is felt ready to undergo coronary bypass grafting.  The indications, risks, benefits, and alternatives were discussed in detail with the patient prior to the surgery.  He understood and accepted the risks and agreed to proceed.  OPERATIVE NOTE:  Mr. Craigen was brought to the preoperative holding area on August 29, 2015.  He had a Swan-Ganz catheter and an arterial blood pressure monitoring line placed by Anesthesia.  He  was taken to the operating room, anesthetized, and intubated.  A Foley catheter was placed.  Intravenous antibiotics were administered.  Transesophageal echocardiography was performed by Dr. Kalman Shan of Anesthesia.  There was no significant valvular pathology.  The chest, abdomen, and legs were prepped and draped in usual sterile fashion.  An incision made in the medial aspect of the right leg at the level of the knee,  the greater saphenous vein was harvested endoscopically from groin to the upper calf.  The vein was of good quality.  Simultaneously, a median sternotomy was performed and the left internal mammary artery was harvested using standard technique.  This was difficult due to the patient's obesity and body habitus, but the mammary artery was a good quality vessel.  2000 units of heparin was administered during the vessel harvest.  The remainder of the full heparin dose was given prior to opening the pericardium.  After harvesting the conduits, the pericardium was opened. Adequate anticoagulation with ACT measurement, and the aorta was cannulated via concentric 2-0 Ethibond pledgeted pursestring sutures.  A dual-stage venous cannula was placed via a pursestring suture in the right atrial appendage.  Cardiopulmonary bypass was instituted and the patient was cooled to 32 degrees Celsius.  The coronary arteries were inspected and anastomotic sites were chosen.  OM1 was too small to graft where it could be located laterally and the diagonal was a small vessel.  The ramus intermedius was small, but acceptable for  grafting.  The LAD, posterior descending and posterior lateral were all good targets.  The conduits were inspected and cut to length.  A foam pad was placed in the pericardium to insulate the heart and protect the left phrenic nerve.  A temperature probe was placed in myocardial septum.  A cardioplegia cannula was placed in the ascending aorta.  The aorta was cross-clamped.  The left  ventricle was emptied via the aortic root vent.  Cardiac arrest then was achieved a combination of cold antegrade blood cardioplegia and topical iced saline.  After achieving a rapid diastolic arrest and adequate septal cooling to less than 10 degrees Celsius, the following distal anastomoses were performed.  A reversed saphenous vein graft was placed sequentially to the posterior descending and posterolateral branches of the right coronary.  These were both 1.5 mm good quality targets.  The vein was good quality.  A side-to-side anastomosis was performed to the posterior descending and an end-to-side to the posterolateral. Both were done with running 7-0 Prolene sutures.  All anastomoses were probed proximally and distally to ensure patency prior to tying the sutures at the completion of each anastomosis. Cardioplegia was administered to assess flow and hemostasis. Both were good.  Next, a reversed saphenous vein graft was placed end-to-side to the ramus intermedius.  It was a 1 mm thin-walled fair quality vessel.  The vein was good quality, it was anastomosed end-to-side with a running 7-0 Prolene suture. Cardioplegia was administered.  Flow was acceptable.  There was good hemostasis.  The left internal mammary artery then was brought through a window in the pericardium.  The distal end was beveled.  It was anastomosed end-to- side to the distal LAD.  Both the mammary and LAD were good quality vessels.  The anastomosis was performed with a running 8-0 Prolene suture.  At the completion of the mammary to LAD anastomosis, the bulldog clamp was briefly removed.  Septal rewarming was noted.  The bulldog clamp was replaced.  The mammary pedicle was tacked to the epicardial surface of the heart with 6-0 Prolene sutures.  Additional cardioplegia was administered.  The vein grafts were cut to length.  The cardioplegic cannula was removed in the ascending aorta. The proximal vein graft  anastomoses were performed to 4.5 mm punch aortotomies with running 6-0 Prolene sutures.  At the completion of the final proximal anastomosis, the patient was placed in Trendelenburg position.  Lidocaine was administered.  The bulldog clamp was again removed from the left mammary artery.  The aortic root was de-aired and the aortic cross-clamp was removed.  Total cross-clamp time was 69 minutes.  While re-warming was completed, all proximal and distal anastomoses were inspected for hemostasis.  Epicardial pacing wires were placed on the right ventricle and right atrium.  When the patient had rewarmed to a core temperature of 37 degrees Celsius, he was weaned from cardiopulmonary bypass on the first attempt without difficulty.  The total bypass time was 105 minutes.  The initial cardiac index was greater than 2 L/minute/m2.  The patient remained hemodynamically stable throughout the post bypass period.  A test dose of protamine was administered and well tolerated.  The atrial and aortic cannulae were removed.  The remainder of the protamine was administered without incident.  Postbypass transesophageal echocardiography showed no change in left ventricular function. Hemostasis was achieved.  The chest was copiously irrigated with warm saline.  The pericardium was re-approximated over the ascending aorta with interrupted 3-0 silk sutures.  The left  pleural and mediastinal chest tubes were placed in separate subcostal incisions and secured with #1 silk sutures.  The sternum was closed with a combination of single and double heavy gauge stainless steel wires.  The pectoralis fascia, subcutaneous tissue, and skin were closed in standard fashion. All sponge, needle, and instrument counts were correct at the end of the procedure.  The patient was taken from the operating room to the surgical intensive care unit in good condition.     Revonda Standard Roxan Hockey, M.D.     SCH/MEDQ  D:   09/02/2015  T:  09/02/2015  Job:  [id number]

## 2015-09-02 NOTE — Progress Notes (Signed)
Patient is refusing BiPAP for the night. RT will continue to monitor.

## 2015-09-03 ENCOUNTER — Inpatient Hospital Stay (HOSPITAL_COMMUNITY): Payer: Federal, State, Local not specified - PPO

## 2015-09-03 LAB — CBC
HCT: 32.1 % — ABNORMAL LOW (ref 39.0–52.0)
Hemoglobin: 10.6 g/dL — ABNORMAL LOW (ref 13.0–17.0)
MCH: 30.9 pg (ref 26.0–34.0)
MCHC: 33 g/dL (ref 30.0–36.0)
MCV: 93.6 fL (ref 78.0–100.0)
Platelets: 205 10*3/uL (ref 150–400)
RBC: 3.43 MIL/uL — ABNORMAL LOW (ref 4.22–5.81)
RDW: 13.5 % (ref 11.5–15.5)
WBC: 6.7 10*3/uL (ref 4.0–10.5)

## 2015-09-03 LAB — BASIC METABOLIC PANEL
Anion gap: 8 (ref 5–15)
BUN: 21 mg/dL — ABNORMAL HIGH (ref 6–20)
CO2: 30 mmol/L (ref 22–32)
Calcium: 8.5 mg/dL — ABNORMAL LOW (ref 8.9–10.3)
Chloride: 97 mmol/L — ABNORMAL LOW (ref 101–111)
Creatinine, Ser: 1.18 mg/dL (ref 0.61–1.24)
GFR calc Af Amer: >60 mL/min (ref >60)
GFR calc non Af Amer: >60 mL/min (ref >60)
Glucose, Bld: 139 mg/dL — ABNORMAL HIGH (ref 65–99)
Potassium: 3.8 mmol/L (ref 3.5–5.1)
Sodium: 135 mmol/L (ref 135–145)

## 2015-09-03 LAB — GLUCOSE, CAPILLARY
Glucose-Capillary: 151 mg/dL — ABNORMAL HIGH (ref 65–99)
Glucose-Capillary: 174 mg/dL — ABNORMAL HIGH (ref 65–99)
Glucose-Capillary: 133 mg/dL — ABNORMAL HIGH (ref 65–99)
Glucose-Capillary: 122 mg/dL — ABNORMAL HIGH (ref 65–99)

## 2015-09-03 MED ORDER — POTASSIUM CHLORIDE CRYS ER 20 MEQ PO TBCR
20.0000 meq | EXTENDED_RELEASE_TABLET | Freq: Once | ORAL | Status: AC
  Administered 2015-09-03: 20 meq via ORAL

## 2015-09-03 NOTE — Progress Notes (Addendum)
      [address]       [place] [address]             [phone]        5 Days Post-Op Procedure(s) (LRB): CORONARY ARTERY BYPASS GRAFTING (CABG) (N/A) TRANSESOPHAGEAL ECHOCARDIOGRAM (TEE) (N/A)  Subjective: Patient sitting at edge of bed this am. He states he has no specific complaints at this time.  Objective: Vital signs in last 24 hours: Temp:  [97.7 F (36.5 C)-99.2 F (37.3 C)] 97.7 F (36.5 C) (09/21 0335) Pulse Rate:  [85-96] 88 (09/21 0335) Cardiac Rhythm:  [-] Normal sinus rhythm (09/21 0736) Resp:  [15-29] 18 (09/21 0335) BP: (91-136)/(55-82) 111/71 mmHg (09/21 0335) SpO2:  [93 %-100 %] 100 % (09/21 0335) Weight:  [250 lb 10.6 oz (113.7 kg)] 250 lb 10.6 oz (113.7 kg) (09/21 0200)  Pre op weight: 115 kg Current Weight  09/03/15 250 lb 10.6 oz (113.7 kg)      Intake/Output from previous day: 09/20 0701 - 09/21 0700 In: 420 [P.O.:420] Out: 1690 [Urine:1690]   Physical Exam:  Cardiovascular: RRR Pulmonary: Diminished throughout bilaterally; no rales, wheezes, or rhonchi. Abdomen: Soft, non tender, bowel sounds present. Extremities: Mild bilateral lower extremity edema. Wounds: Clean and dry.  No erythema or signs of infection.   Lab Results: CBC: Recent Labs  09/02/15 0402 09/03/15 0318  WBC 8.0 6.7  HGB 10.4* 10.6*  HCT 31.8* 32.1*  PLT 170 205   BMET:  Recent Labs  09/02/15 0402 09/03/15 0318  NA 136 135  K 3.8 3.8  CL 98* 97*  CO2 27 30  GLUCOSE 143* 139*  BUN 21* 21*  CREATININE 1.21 1.18  CALCIUM 8.1* 8.5*    PT/INR:  Lab Results  Component Value Date   INR 1.41 08/29/2015   INR 1.18 08/28/2015   INR 1.20 08/22/2015   ABG:  INR: Will add last result for INR, ABG once components are confirmed Will add last 4 CBG results once components are confirmed  Assessment/Plan:  1. CV - SR in the 80's. On Lopressor 12.5 mg bid, HCTZ 12.5 mg daily. 2.  Pulmonary - On 2 liters of oxygen via [place]. Wean to room  air as tolerates. CXR shows small left pleural effusion and atelectasis. Encourage incentive spirometer 3. Volume overload-on Lasix 40 mg bid 4.  Acute blood loss anemia - H and H stable at 10.6 and 32.1 5. Supplement potassium 6. DM-CBGs 166/191/151. On Metformin 1000 mg bid, Tradjenta 5 mg daily, and Insulin. Pre op HGA1C 9.3. Will need close medical follow up after discharge. 7. Possible home 1-2 days  ZIMMERMAN,DONIELLE MPA-C 09/03/2015,9:31 AM  Patient seen and examined, agree with above He looks remarkably better over past 48 hours Should be ready for dc tomorrow  [name], MD [facility] [phone]

## 2015-09-03 NOTE — Discharge Summary (Signed)
Physician Discharge Summary       [address]       [place] [address]             [phone]    Patient ID: [name] MRN: [id number] DOB/AGE: 01/30/1961 54 y.o.  Admit date: 08/22/2015 Discharge date: 09/04/2015  Admission Diagnoses: 1. S/p NSTEMI 2. History of hypertension 3. History of hyperlipidemia 4. History of diabetes mellitus type 2, uncontrolled 5. History of morbid obesity  Discharge Diagnoses:  1. S/p NSTEMI 2. History of hypertension 3. History of hyperlipidemia 4. History of diabetes mellitus type 2, uncontrolled 5. History of morbid obesity 6. ABL anemia 7. Wound infection right leg       Procedure (s): Median sternotomy, extracorporeal circulation, coronary artery bypass grafting x4 (left internal mammary artery to left anterior descending, saphenous vein graft to ramus intermedius, sequential saphenous vein graft to posterior descending and posterolateral), endoscopic vein harvest, right leg by Dr. Roxan Hockey on 08/29/2015.  History of Presenting Illness: This is a 54 year old man with a past medical history significant for hypertension, hyperlipidemia, insulin-dependent type 2 diabetes, morbid obesity, and a strong family history of coronary disease. He was in his usual state of health until after dinner Friday night (08/22/2015). After eating, he developed substernal chest pressure. He initially thought this was due to food "sticking." However, the pain persisted and was associated with shortness of breath, nausea, and mild diaphoresis. He took 3 baby aspirin and one of his wife's nitroglycerin tablets. The pain improved but did not resolve and then it worsened again. He called EMS and was taken to the [facility] emergency room. His EKG showed sinus tachycardia and his initial troponin was negative, but a subsequent troponin was 0.48. His pain resolved with nitroglycerin, but he ruled in for a non-Q-wave MI with a peak troponin of  6.09.   He was transferred to [facility] for further evaluation and treatment. His CBGs was markedly elevated on admission at 459 and his creatinine was elevated at 1.47. Both of those improved with treatment since admission. His hemoglobin A1c was elevated at 9.3.  He had an echocardiogram on 08/24/2015 which showed an ejection fraction of 45-50% and no significant valvular pathology. He had cardiac catheterization on 08/25/2015 which revealed severe three-vessel coronary disease.  He has been pain free since admission. He does complain of a headache from the nitroglycerin. He received 1 dose of Plavix, 300 mg on admission.  Dr. Roxan Hockey discussed the need for coronary artery bypass grafting surgery. Potential risks, benefits, and complications were discussed with the patient and he agreed to proceed with surgery. Pre operative carotid duplex showed no significant internal carotid artery stenosis bilaterally. After Plavix washout, he underwent a CABG x 4 on 08/29/2015.  Brief Hospital Course:  The patient was extubated the afternoon of surgery without difficulty. He remained afebrile and hemodynamically stable. Gordy Councilman, a line, chest tubes, and foley were removed early in the post operative course. Lopressor was started and titrated accordingly. He was volume over loaded and diuresed. He had ABL anemia. He did not require a post op transfusion. His last H and H was H stable at 10.6 and 32.1. He did require Bi pap initially post op but was weaned to a nasal cannula. He then intermittingly used bi pap. He was weaned off the insulin drip.  The patient's HGA1C pre op was 9.3. He will need close medical follow up after discharge. Once he was tolerating a diet,  home diabetic medicines of Tradjenta and Metformin were restarted. He was also on some Insulin. The patient's glucose remained well controlled.The patient was felt surgically stable for transfer from the ICU to [facility] for further  convalescence on 09/02/2015. He continues to progress with cardiac rehab. He is ambulating on 2 liters of oxygen via [place]. We will wean him to room air prior to discharge.He has been tolerating a diet and has had a bowel movement. Epicardial pacing wires and chest tube sutures will be removed prior to discharge. He developed redness along the vein harvest tract on the right lower leg and was started on Keflex empirically. The patient is felt surgically stable for discharge today.  Latest Vital Signs: Blood pressure 135/77, pulse 89, temperature 99.4 F (37.4 C), temperature source Oral, resp. rate 17, height 5\' 10"  (1.778 m), weight 244 lb 9.6 oz (110.95 kg), SpO2 97 %.  Physical Exam: Cardiovascular: RRR Pulmonary: Diminished throughout bilaterally; no rales, wheezes, or rhonchi. Abdomen: Soft, non tender, bowel sounds present. Extremities: Mild bilateral lower extremity edema. Wounds: Clean and dry. No erythema or signs of infection.  Discharge Condition:Stable and discharged to home  Recent laboratory studies:  Lab Results  Component Value Date   WBC 6.7 09/03/2015   HGB 10.6* 09/03/2015   HCT 32.1* 09/03/2015   MCV 93.6 09/03/2015   PLT 205 09/03/2015   Lab Results  Component Value Date   NA 135 09/03/2015   K 3.8 09/03/2015   CL 97* 09/03/2015   CO2 30 09/03/2015   CREATININE 1.18 09/03/2015   GLUCOSE 139* 09/03/2015     Diagnostic Studies: Dg Chest 2 View  09/03/2015   CLINICAL DATA:  Chest soreness and shortness of breath this morning status post CABG.  EXAM: CHEST  2 VIEW  COMPARISON:  Chest x-rays dated 09/02/2015, 08/30/2015, and 08/22/2015.  FINDINGS: Cardiomediastinal silhouette is stable in size and configuration with mild cardiomegaly. Median sternotomy wires intact and stable in alignment.  Persistent opacity at the left lung base is probably atelectasis and/or small effusion. Lungs otherwise clear. Lung aeration is improved in the interval. Central pulmonary vascular  congestion is improved.  IMPRESSION: Improved fluid status with improved aeration throughout both lungs and decreased central pulmonary vascular congestion.  Persistent opacity at the left lung base, most likely atelectasis and/or small effusion.  Heart size is stable.   Electronically Signed   By: Franki Cabot M.D.   On: 09/03/2015 08:26     Discharge Medications: The patient has been discharged on:   1.Beta Blocker:  Yes [ x  ]                              No   [   ]                              If No, reason:  2.Ace Inhibitor/ARB: Yes [ x  ]                                     No  [    ]                                     If No, reason:  3.Statin:   Yes [ x  ]                  No  [   ]                  If No, reason:  4.Ecasa:  Yes  [  x ]                  No   [   ]                  If No, reason:     Medication List    STOP taking these medications        traMADol-acetaminophen 37.5-325 MG per tablet  Commonly known as:  ULTRACET      TAKE these medications        aspirin 325 MG EC tablet  Take 1 tablet (325 mg total) by mouth daily.     cephALEXin 500 MG capsule  Commonly known as:  KEFLEX  Take 1 capsule (500 mg total) by mouth every 8 (eight) hours. X 10 days     chlorpheniramine-HYDROcodone 10-8 MG/5ML Suer  Commonly known as:  TUSSIONEX  Take 5 mLs by mouth every 6 (six) hours as needed for cough.     furosemide 40 MG tablet  Commonly known as:  LASIX  Take 1 tablet (40 mg total) by mouth daily. X 7 days  Start taking on:  09/05/2015     guaiFENesin 600 MG 12 hr tablet  Commonly known as:  MUCINEX  Take 2 tablets (1,200 mg total) by mouth 2 (two) times daily as needed.     hydrochlorothiazide 25 MG tablet  Commonly known as:  HYDRODIURIL  Take 12.5 mg by mouth daily.     insulin lispro protamine-lispro (75-25) 100 UNIT/ML Susp injection  Commonly known as:  HUMALOG 75/25 MIX  Inject 70 Units into the skin 2 (two) times daily with a meal.      KOMBIGLYZE XR 2.04-999 MG Tb24  Generic drug:  Saxagliptin-Metformin  Take 1 tablet by mouth 2 (two) times daily.     loratadine 10 MG tablet  Commonly known as:  CLARITIN  Take 10 mg by mouth daily.     metoprolol tartrate 25 MG tablet  Commonly known as:  LOPRESSOR  Take 0.5 tablets (12.5 mg total) by mouth 2 (two) times daily.     olmesartan 40 MG tablet  Commonly known as:  BENICAR  Take 40 mg by mouth daily.     oxyCODONE 5 MG immediate release tablet  Commonly known as:  Oxy IR/ROXICODONE  Take 1-2 tablets (5-10 mg total) by mouth every 3 (three) hours as needed for severe pain.     potassium chloride SA 20 MEQ tablet  Commonly known as:  K-DUR,KLOR-CON  Take 1 tablet (20 mEq total) by mouth daily. X 7 days     pravastatin 20 MG tablet  Commonly known as:  PRAVACHOL  Take 20 mg by mouth at bedtime.     ranitidine 150 MG tablet  Commonly known as:  ZANTAC  Take 150 mg by mouth at bedtime.     traMADol 50 MG tablet  Commonly known as:  ULTRAM  Take 1-2 tablets (50-100 mg total) by mouth every 4 (four) hours as needed for moderate pain.         Follow Up Appointments: Follow-up Information    Follow up with Jory Sims, NP On  09/18/2015.   Specialties:  Nurse Practitioner, Radiology, Cardiology   Why:  Appointment time is at 1:30 pm   Contact information:   [place] [address] [phone]       Follow up with [name], MD On 10/07/2015.   Specialty:  Cardiothoracic Surgery   Why:  PA/LAT CXR to be taken (at [facility] which is in the same building as Dr. Leonarda Salon office) on 10/07/2015 at 8:45 am;Appointment time is at 9:30 am   Contact information:   [place] [address] [phone]       Follow up with Purvis Kilts, MD.   Specialty:  Family Medicine   Why:  Call for further surveillance of HGA1C 9.3 and diabetes management   Contact information:   [address] [place] [id number] [phone]       Signed: Cinda Quest 09/04/2015, 9:13 AM

## 2015-09-03 NOTE — Progress Notes (Signed)
Patient refused BIPAP for the night.  

## 2015-09-03 NOTE — Discharge Instructions (Signed)
Activity: 1.May walk up steps °               2.No lifting more than ten pounds for four weeks.  °               3.No driving for four weeks. °               4.Stop any activity that causes chest pain, shortness of breath, dizziness, sweating or excessive weakness. °               5.Avoid straining. °               6.Continue with your breathing exercises daily. ° °Diet: Diabetic diet and Low fat, Low salt diet ° °Wound Care: May shower.  Clean wounds with mild soap and water daily. Contact the office at [phone] if any problems arise. ° °Coronary Artery Bypass Grafting, Care After °Refer to this sheet in the next few weeks. These instructions provide you with information on caring for yourself after your procedure. Your health care provider may also give you more specific instructions. Your treatment has been planned according to current medical practices, but problems sometimes occur. Call your health care provider if you have any problems or questions after your procedure. °WHAT TO EXPECT AFTER THE PROCEDURE °Recovery from surgery will be different for everyone. Some people feel well after 3 or 4 weeks, while for others it takes longer. After your procedure, it is typical to have the following: °· Nausea and a lack of appetite.   °· Constipation. °· Weakness and fatigue.   °· Depression or irritability.   °· Pain or discomfort at your incision site. °HOME CARE INSTRUCTIONS °· Take medicines only as directed by your health care provider. Do not stop taking medicines or start any new medicines without first checking with your health care provider. °· Take your pulse as directed by your health care provider. °· Perform deep breathing as directed by your health care provider. If you were given a device called an incentive spirometer, use it to practice deep breathing several times a day. Support your chest with a pillow or your arms when you take deep breaths or cough. °· Keep incision areas clean, dry, and  protected. Remove or change any bandages (dressings) only as directed by your health care provider. You may have skin adhesive strips over the incision areas. Do not take the strips off. They will fall off on their own. °· Check incision areas daily for any swelling, redness, or drainage. °· If incisions were made in your legs, do the following: °¨ Avoid crossing your legs.   °¨ Avoid sitting for long periods of time. Change positions every 30 minutes.   °¨ Elevate your legs when you are sitting. °· Wear compression stockings as directed by your health care provider. These stockings help keep blood clots from forming in your legs. °· Take showers once your health care provider approves. Until then, only take sponge baths. Pat incisions dry. Do not rub incisions with a washcloth or towel. Do not take baths, swim, or use a hot tub until your health care provider approves. °· Eat foods that are high in fiber, such as raw fruits and vegetables, whole grains, beans, and nuts. Meats should be lean cut. Avoid canned, processed, and fried foods. °· Drink enough fluid to keep your urine clear or pale yellow. °· Weigh yourself every day. This helps identify if you are retaining fluid that may make your heart and lungs   work harder. °· Rest and limit activity as directed by your health care provider. You may be instructed to: °¨ Stop any activity at once if you have chest pain, shortness of breath, irregular heartbeats, or dizziness. Get help right away if you have any of these symptoms. °¨ Move around frequently for short periods or take short walks as directed by your health care provider. Increase your activities gradually. You may need physical therapy or cardiac rehabilitation to help strengthen your muscles and build your endurance. °¨ Avoid lifting, pushing, or pulling anything heavier than 10 lb (4.5 kg) for at least 6 weeks after surgery. °· Do not drive until your health care provider approves.  °· Ask your health  care provider when you may return to work. °· Ask your health care provider when you may resume sexual activity. °· Keep all follow-up visits as directed by your health care provider. This is important. °SEEK MEDICAL CARE IF: °· You have swelling, redness, increasing pain, or drainage at the site of an incision. °· You have a fever. °· You have swelling in your ankles or legs. °· You have pain in your legs.   °· You gain 2 or more pounds (0.9 kg) a day. °· You are nauseous or vomit. °· You have diarrhea.  °SEEK IMMEDIATE MEDICAL CARE IF: °· You have chest pain that goes to your jaw or arms. °· You have shortness of breath.   °· You have a fast or irregular heartbeat.   °· You notice a "clicking" in your breastbone (sternum) when you move.   °· You have numbness or weakness in your arms or legs. °· You feel dizzy or light-headed.   °MAKE SURE YOU: °· Understand these instructions. °· Will watch your condition. °· Will get help right away if you are not doing well or get worse. °Document Released: 06/18/2005 Document Revised: 04/15/2014 Document Reviewed: 05/08/2013 °[employer]® Patient Information ©2015 [employer]. This information is not intended to replace advice given to you by your health care provider. Make sure you discuss any questions you have with your health care provider. ° ° ° °

## 2015-09-03 NOTE — Progress Notes (Signed)
CARDIAC REHAB PHASE I   PRE:  Rate/Rhythm: 92 SR  BP:  Supine:   Sitting: 104/71  Standing:    SaO2: 100% 2.5 L  MODE:  Ambulation: 350 ft   POST:  Rate/Rhythm: 109 ST  BP:  Supine:   Sitting: 76/38  Retook 112/66  Standing:    SaO2: 99%RA 1050-1120 Pt walked 350 ft on RA with shoes on and rolling walker and asst x 1. Tolerated well. No dizziness so do not think BP accurate at 76/38. To recliner with call bell. Left off of oxygen.   Graylon Good, RN BSN  09/03/2015 11:18 AM

## 2015-09-04 LAB — GLUCOSE, CAPILLARY
Glucose-Capillary: 138 mg/dL — ABNORMAL HIGH (ref 65–99)
Glucose-Capillary: 178 mg/dL — ABNORMAL HIGH (ref 65–99)

## 2015-09-04 MED ORDER — GUAIFENESIN ER 600 MG PO TB12: 1200.0000 mg | ORAL_TABLET | Freq: Two times a day (BID) | ORAL | Status: DC | PRN

## 2015-09-04 MED ORDER — FUROSEMIDE 40 MG PO TABS: 40.0000 mg | ORAL_TABLET | Freq: Every day | ORAL | Status: DC

## 2015-09-04 MED ORDER — POTASSIUM CHLORIDE CRYS ER 20 MEQ PO TBCR: 20.0000 meq | EXTENDED_RELEASE_TABLET | Freq: Every day | ORAL | Status: DC

## 2015-09-04 MED ORDER — IRBESARTAN 150 MG PO TABS
75.0000 mg | ORAL_TABLET | Freq: Every day | ORAL | Status: DC
Start: 2015-09-04 — End: 2015-09-04
  Administered 2015-09-04: 75 mg via ORAL
  Filled 2015-09-04: qty 1

## 2015-09-04 MED ORDER — CEPHALEXIN 500 MG PO CAPS
500.0000 mg | ORAL_CAPSULE | Freq: Three times a day (TID) | ORAL | Status: DC
  Administered 2015-09-04 (×2): 500 mg via ORAL
  Filled 2015-09-04: qty 1

## 2015-09-04 MED ORDER — HYDROCOD POLST-CPM POLST ER 10-8 MG/5ML PO SUER: 5.0000 mL | Freq: Four times a day (QID) | ORAL | Status: DC | PRN

## 2015-09-04 MED ORDER — ASPIRIN 325 MG PO TBEC: 325.0000 mg | DELAYED_RELEASE_TABLET | Freq: Every day | ORAL | Status: DC

## 2015-09-04 MED ORDER — TRAMADOL HCL 50 MG PO TABS: 50.0000 mg | ORAL_TABLET | ORAL | Status: DC | PRN

## 2015-09-04 MED ORDER — OXYCODONE HCL 5 MG PO TABS: 5.0000 mg | ORAL_TABLET | ORAL | Status: DC | PRN

## 2015-09-04 MED ORDER — POTASSIUM CHLORIDE CRYS ER 20 MEQ PO TBCR
20.0000 meq | EXTENDED_RELEASE_TABLET | Freq: Every day | ORAL | Status: DC
  Administered 2015-09-04: 20 meq via ORAL
  Filled 2015-09-04: qty 1

## 2015-09-04 MED ORDER — METOPROLOL TARTRATE 25 MG PO TABS: 12.5000 mg | ORAL_TABLET | Freq: Two times a day (BID) | ORAL | Status: DC

## 2015-09-04 MED ORDER — CEPHALEXIN 500 MG PO CAPS: 500.0000 mg | ORAL_CAPSULE | Freq: Three times a day (TID) | ORAL | Status: DC
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
## 2015-09-04 NOTE — Progress Notes (Signed)
Utilization review completed.  

## 2015-09-04 NOTE — Progress Notes (Signed)
Ed completed with pt and wife. Voiced understanding and requests his referral be sent to [facility]. Set up d/c video for pt and wife. Gu[place]upe 2:06 PM 09/04/2015

## 2015-09-04 NOTE — Progress Notes (Signed)
EPW removed. VSS. Patient tolerate well. Patient educated on bedrest for an hour with vitals taken every 15 minutes. Call bell within reach. Will continue to monitor.   Domingo Dimes RN

## 2015-09-04 NOTE — Progress Notes (Signed)
Patient Ambulated 126ft independently with no equipment. Patient tolerated well. VSS. Patient to chair. Will continue to monitor.   Domingo Dimes RN

## 2015-09-04 NOTE — Progress Notes (Signed)
Patient to D/C home with wife. IV removed. Tele box removed. Patient education done with wife at bedside. Handout given about Keflex. Belongings such as cell phone and glasses given to wife. Patient taken to the front to D/C with wife.   Domingo Dimes RN

## 2015-09-18 ENCOUNTER — Ambulatory Visit (INDEPENDENT_AMBULATORY_CARE_PROVIDER_SITE_OTHER): Payer: Federal, State, Local not specified - PPO | Admitting: Adult Health

## 2015-09-18 ENCOUNTER — Encounter: Payer: Self-pay | Admitting: Adult Health

## 2015-09-18 VITALS — BP 126/72 | HR 97 | Ht 70.0 in | Wt 245.4 lb

## 2015-09-18 DIAGNOSIS — Z951 Presence of aortocoronary bypass graft: Secondary | ICD-10-CM

## 2015-09-18 MED ORDER — OLMESARTAN MEDOXOMIL 40 MG PO TABS: 40.0000 mg | ORAL_TABLET | Freq: Every day | ORAL | Status: DC

## 2015-09-18 MED ORDER — METOPROLOL TARTRATE 25 MG PO TABS: 12.5000 mg | ORAL_TABLET | Freq: Two times a day (BID) | ORAL | Status: DC

## 2015-09-18 MED ORDER — PRAVASTATIN SODIUM 20 MG PO TABS: 20.0000 mg | ORAL_TABLET | Freq: Every day | ORAL | Status: DC

## 2015-09-18 NOTE — Patient Instructions (Signed)
Your physician recommends that you schedule a follow-up appointment in: Dr Domenic Polite in 3 months     Your physician recommends that you continue on your current medications as directed. Please refer to the Current Medication list given to you today.       Thank you for choosing [facility] !

## 2015-09-18 NOTE — Progress Notes (Signed)
Cardiology Office Note   Date:  09/18/2015   ID:  Railey, Seyer [birth year], MRN [id number]  PCP:  Purvis Kilts, MD  Cardiologist: To be established/ Jory Sims, NP   Chief Complaint  Patient presents with  . Coronary Artery Disease  . Hypertension      History of Present Illness: [name] is a 54 y.o. male who presents for post hospitalization for ongoing assessment and management of coronary artery disease, status post admission for non-ST elevation MI, with cardiac catheterization demonstrating severe three-vessel coronary artery disease, 08/25/2015.  Cardiovascular surgery was consulted, and the patient underwent coronary artery bypass grafting x4 (left internal mammary artery to left anterior descending, SVG to ramus intermediate, a sequential vein graft to posterior descending and posterior lateral).  Other history includes hypertension, uncontrolled, diabetes,hyperlipidemia, and morbid obesity.  It was noted, but the patient was also treated for a wound infection in her right leg. The patient was also referred to cardiac rehabilitation.  This is a TOC f/u.  Comes  today on follow well without any cardiac complaints with the exception of some mild soreness the operative site in his chest, and some fatigue, which is coming and going.  His leg is feeling much better, and does not have evidence of infection.  He states he is walking 30 minutes twice a day. He has no side effects from  any of the medications is provided.  Past Medical History  Diagnosis Date  . HTN (hypertension)   . Diabetes mellitus   . High cholesterol   . Arthritis     Past Surgical History  Procedure Laterality Date  . Appendectomy    . Right biceps tendon    . Right hand nerve damage    . Left knee arthroscopy    . Knee arthroscopy  left  . Cardiac catheterization N/A 08/25/2015    Procedure: Left Heart Cath and Coronary Angiography;  Surgeon: Belva Crome, MD;  Location:  [facility];  Service: Cardiovascular;  Laterality: N/A;  . Coronary artery bypass graft N/A 08/29/2015    Procedure: CORONARY ARTERY BYPASS GRAFTING (CABG);  Surgeon: [name], MD;  Location: [facility];  Service: Open Heart Surgery;  Laterality: N/A;  . Tee without cardioversion N/A 08/29/2015    Procedure: TRANSESOPHAGEAL ECHOCARDIOGRAM (TEE);  Surgeon: [name], MD;  Location: [facility];  Service: Open Heart Surgery;  Laterality: N/A;     Current Outpatient Prescriptions  Medication Sig Dispense Refill  . aspirin EC 325 MG EC tablet Take 1 tablet (325 mg total) by mouth daily. 30 tablet 0  . chlorpheniramine-HYDROcodone (TUSSIONEX) 10-8 MG/5ML SUER Take 5 mLs by mouth every 6 (six) hours as needed for cough. 140 mL 0  . guaiFENesin (MUCINEX) 600 MG 12 hr tablet Take 2 tablets (1,200 mg total) by mouth 2 (two) times daily as needed.    . hydrochlorothiazide 25 MG tablet Take 12.5 mg by mouth daily.     . insulin lispro protamine-lispro (HUMALOG 75/25 MIX) (75-25) 100 UNIT/ML SUSP injection Inject 70 Units into the skin 2 (two) times daily with a meal.    . KOMBIGLYZE XR 2.04-999 MG TB24 Take 1 tablet by mouth 2 (two) times daily.     [name] loratadine (CLARITIN) 10 MG tablet Take 10 mg by mouth daily.    . metoprolol tartrate (LOPRESSOR) 25 MG tablet Take 0.5 tablets (12.5 mg total) by mouth 2 (two) times daily. 90 tablet 3  . olmesartan (  BENICAR) 40 MG tablet Take 1 tablet (40 mg total) by mouth daily. 90 tablet 3  . oxyCODONE (OXY IR/ROXICODONE) 5 MG immediate release tablet Take 1-2 tablets (5-10 mg total) by mouth every 3 (three) hours as needed for severe pain. 30 tablet 0  . potassium chloride SA (K-DUR,KLOR-CON) 20 MEQ tablet Take 1 tablet (20 mEq total) by mouth daily. X 7 days 7 tablet 0  . pravastatin (PRAVACHOL) 20 MG tablet Take 1 tablet (20 mg total) by mouth at bedtime. 90 tablet 3  . ranitidine (ZANTAC) 150 MG tablet Take 150 mg by mouth at bedtime.     .  traMADol (ULTRAM) 50 MG tablet Take 1-2 tablets (50-100 mg total) by mouth every 4 (four) hours as needed for moderate pain. 30 tablet 0   No current facility-administered medications for this visit.    Allergies:   Review of patient's allergies indicates no known allergies.    Social History:  The patient  reports that he has never smoked. He does not have any smokeless tobacco history on file. He reports that he does not drink alcohol or use illicit drugs.   Family History:  The patient's family history includes Arthritis in an other family member; CAD in his brother and father; Cancer in an other family member; Diabetes in an other family member; Diabetes Mellitus II in his brother and father. There is no history of Anesthesia problems, Hypotension, Malignant hyperthermia, or Pseudochol deficiency.    ROS: All other systems are reviewed and negative. Unless otherwise mentioned in H&P    PHYSICAL EXAM: VS:  BP 126/72 mmHg  Pulse 97  Ht 5\' 10"  (1.778 m)  Wt 245 lb 6.4 oz (111.313 kg)  BMI 35.21 kg/m2  SpO2 98% , BMI Body mass index is 35.21 kg/(m^2). GEN: Well nourished, well developed, in no acute distress HEENT: normal Neck: no JVD, carotid bruits, or masses Cardiac: RRR; no murmurs, rubs, or gallops,no edema  Respiratory:  clear to auscultation bilaterally, normal work of breathing GI: soft, nontender, nondistended, + BS MS: no deformity or atrophy Skin: warm and dry, no rash Neuro:  Strength and sensation are intact Psych: euthymic mood, full affect   Recent Labs: 08/23/2015: ALT 29 08/30/2015: Magnesium 2.3 09/03/2015: BUN 21*; Creatinine, Ser 1.18; Hemoglobin 10.6*; Platelets 205; Potassium 3.8; Sodium 135    Lipid Panel No results found for: CHOL, TRIG, HDL, CHOLHDL, VLDL, LDLCALC, LDLDIRECT    Wt Readings from Last 3 Encounters:  09/18/15 245 lb 6.4 oz (111.313 kg)  09/04/15 244 lb 9.6 oz (110.95 kg)  05/05/15 248 lb (112.492 kg)      Other studies  Reviewed: Additional studies/ records that were reviewed today include: none Review of the above records demonstrates: N/A   ASSESSMENT AND PLAN:  1. CAD: Status post coronary artery bypass grafting on 08/25/2015.  He is doing very well postoperatively.  He is walking, without discomfort, improvement in leg infection and appears to be well-healed.  He is tolerating his medications without side effects.  I spent a good deal of time with him going over his medications, also any symptoms, which would require him to call his surgeon or our office.  He is already had an appointment made with cardiac rehabilitation and is due to start with them in approximately 10 days.  I will give him refills on metoprolol, losartan, and pravastatin.  It is noted on his vital signs it is harder was mildly elevated.  On reassessment, heart rate was rechecked and  was found to be 78 beats per minute. He has been given information about a website [email or web address]  He is advised if he has further questions or would like to know more about his heart disease, this would be a good resource for him.  He requests to be established with Dr. Domenic Polite, as his wife is also being followed by him.  A followup appointment for 3 months will be made to establish with a local cardiologist.he is to seek cardiac surgeons within the next 10 days.  At that time, labs and repeat chest x-ray will be completed.  I have answered multiple questions and encouraged him on his weight loss, and lifestyle modifications.  2. Hypertension: blood pressure is well controlled currently.  Will not make any change in his medication at this time.it is noted, that he has lost approximately 100 pounds and is continuing to lose weight.  He will need medication adjustments concerning this to avoid hypotension.  3. Diabetes: He is being followed by Dr. Hilma Favors, with medication adjustment.  He is very careful now in his diet and continues to lose weight.  4.  Hypercholesterolemia: He will continue statin therapy, with followup lipids and LFTs in 3 months.  Labs are usually drawn by Dr. Hilma Favors.  If they have not been completed by next followup appointment.  These will be ordered.   Current medicines are reviewed at length with the patient today.    Labs/ tests ordered today include: None No orders of the defined types were placed in this encounter.     Disposition:   FU with 3 months with Dr. Domenic Polite (patient request). Signed, Jory Sims, NP  09/18/2015 1:40 PM    [facility] [address], [place], [place] [address] Phone: [phone]; Fax: [phone]

## 2015-09-18 NOTE — Progress Notes (Deleted)
Name: [name]    DOB: 02-Aug-1961  Age: 54 y.o.  MR#: [id number]       PCP:  Purvis Kilts, MD      Insurance: Payor: [place] / Plan: BCBS/FEDERAL EMP PPO / Product Type: *No Product type* /   CC:    Chief Complaint  Patient presents with  . Coronary Artery Disease  . Hypertension    VS Filed Vitals:   09/18/15 1312  BP: 126/72  Pulse: 97  Height: 5\' 10"  (1.778 m)  Weight: 245 lb 6.4 oz (111.313 kg)  SpO2: 98%    Weights Current Weight  09/18/15 245 lb 6.4 oz (111.313 kg)  09/04/15 244 lb 9.6 oz (110.95 kg)  05/05/15 248 lb (112.492 kg)    Blood Pressure  BP Readings from Last 3 Encounters:  09/18/15 126/72  09/04/15 96/76  05/05/15 135/84     Admit date:  (Not on file) Last encounter with RMR:  Visit date not found   Allergy Review of patient's allergies indicates no known allergies.  Current Outpatient Prescriptions  Medication Sig Dispense Refill  . aspirin EC 325 MG EC tablet Take 1 tablet (325 mg total) by mouth daily. 30 tablet 0  . chlorpheniramine-HYDROcodone (TUSSIONEX) 10-8 MG/5ML SUER Take 5 mLs by mouth every 6 (six) hours as needed for cough. 140 mL 0  . guaiFENesin (MUCINEX) 600 MG 12 hr tablet Take 2 tablets (1,200 mg total) by mouth 2 (two) times daily as needed.    . hydrochlorothiazide 25 MG tablet Take 12.5 mg by mouth daily.     . insulin lispro protamine-lispro (HUMALOG 75/25 MIX) (75-25) 100 UNIT/ML SUSP injection Inject 70 Units into the skin 2 (two) times daily with a meal.    . KOMBIGLYZE XR 2.04-999 MG TB24 Take 1 tablet by mouth 2 (two) times daily.     [name] loratadine (CLARITIN) 10 MG tablet Take 10 mg by mouth daily.    . metoprolol tartrate (LOPRESSOR) 25 MG tablet Take 0.5 tablets (12.5 mg total) by mouth 2 (two) times daily. 30 tablet 3  . olmesartan (BENICAR) 40 MG tablet Take 40 mg by mouth daily.    [name] oxyCODONE (OXY IR/ROXICODONE) 5 MG immediate release tablet Take 1-2 tablets (5-10 mg total) by mouth  every 3 (three) hours as needed for severe pain. 30 tablet 0  . potassium chloride SA (K-DUR,KLOR-CON) 20 MEQ tablet Take 1 tablet (20 mEq total) by mouth daily. X 7 days 7 tablet 0  . pravastatin (PRAVACHOL) 20 MG tablet Take 20 mg by mouth at bedtime.     . ranitidine (ZANTAC) 150 MG tablet Take 150 mg by mouth at bedtime.     . traMADol (ULTRAM) 50 MG tablet Take 1-2 tablets (50-100 mg total) by mouth every 4 (four) hours as needed for moderate pain. 30 tablet 0   No current facility-administered medications for this visit.    Discontinued Meds:    Medications Discontinued During This Encounter  Medication Reason  . cephALEXin (KEFLEX) 500 MG capsule Error  . furosemide (LASIX) 40 MG tablet Error    Patient Active Problem List   Diagnosis Date Noted  . S/P CABG x 4 08/29/2015  . Coronary artery disease involving native coronary artery with other forms of angina pectoris ([facility])   . Morbid obesity ([facility])   . Hyperglycemia   . Chronic systolic HF (heart failure) ([place])   . Pain in the chest 08/23/2015  . Non-ST elevation MI (NSTEMI) (  [facility]) 08/23/2015  . Diabetes mellitus type 2, uncontrolled ([facility]) 08/23/2015  . Hypertension 08/23/2015  . Hyperlipidemia 08/23/2015  . Osteoarthritis, knee 05/24/2012  . Knee pain 10/29/2011  . Knee stiffness 10/29/2011  . S/P right knee arthroscopy 10/26/2011  . Medial meniscus, posterior horn derangement 09/07/2011  . Lateral meniscus derangement 09/07/2011  . OA (osteoarthritis) of knee 09/07/2011  . Rotator cuff syndrome of left shoulder 08/19/2011  . Right knee meniscal tear 08/19/2011    LABS    Component Value Date/Time   NA 135 09/03/2015 0318   NA 136 09/02/2015 0402   NA 136 09/01/2015 0414   K 3.8 09/03/2015 0318   K 3.8 09/02/2015 0402   K 3.5 09/01/2015 0414   CL 97* 09/03/2015 0318   CL 98* 09/02/2015 0402   CL 102 09/01/2015 0414   CO2 30 09/03/2015 0318   CO2 27 09/02/2015 0402   CO2 27 09/01/2015 0414   GLUCOSE 139*  09/03/2015 0318   GLUCOSE 143* 09/02/2015 0402   GLUCOSE 127* 09/01/2015 0414   BUN 21* 09/03/2015 0318   BUN 21* 09/02/2015 0402   BUN 19 09/01/2015 0414   CREATININE 1.18 09/03/2015 0318   CREATININE 1.21 09/02/2015 0402   CREATININE 1.21 09/01/2015 0414   CALCIUM 8.5* 09/03/2015 0318   CALCIUM 8.1* 09/02/2015 0402   CALCIUM 8.0* 09/01/2015 0414   GFRNONAA >60 09/03/2015 0318   GFRNONAA >60 09/02/2015 0402   GFRNONAA >60 09/01/2015 0414   GFRAA >60 09/03/2015 0318   GFRAA >60 09/02/2015 0402   GFRAA >60 09/01/2015 0414   CMP     Component Value Date/Time   NA 135 09/03/2015 0318   K 3.8 09/03/2015 0318   CL 97* 09/03/2015 0318   CO2 30 09/03/2015 0318   GLUCOSE 139* 09/03/2015 0318   BUN 21* 09/03/2015 0318   CREATININE 1.18 09/03/2015 0318   CALCIUM 8.5* 09/03/2015 0318   PROT 6.2* 08/23/2015 0545   ALBUMIN 3.4* 08/23/2015 0545   AST 71* 08/23/2015 0545   ALT 29 08/23/2015 0545   ALKPHOS 37* 08/23/2015 0545   BILITOT 0.7 08/23/2015 0545   GFRNONAA >60 09/03/2015 0318   GFRAA >60 09/03/2015 0318       Component Value Date/Time   WBC 6.7 09/03/2015 0318   WBC 8.0 09/02/2015 0402   WBC 8.9 09/01/2015 0414   HGB 10.6* 09/03/2015 0318   HGB 10.4* 09/02/2015 0402   HGB 9.6* 09/01/2015 0414   HCT 32.1* 09/03/2015 0318   HCT 31.8* 09/02/2015 0402   HCT 28.6* 09/01/2015 0414   MCV 93.6 09/03/2015 0318   MCV 94.9 09/02/2015 0402   MCV 94.7 09/01/2015 0414    Lipid Panel  No results found for: CHOL, TRIG, HDL, CHOLHDL, VLDL, LDLCALC, LDLDIRECT  ABG    Component Value Date/Time   PHART 7.373 08/29/2015 1718   PCO2ART 37.4 08/29/2015 1718   PO2ART 90.0 08/29/2015 1718   HCO3 21.7 08/29/2015 1718   TCO2 22 08/30/2015 1710   ACIDBASEDEF 3.0* 08/29/2015 1718   O2SAT 97.0 08/29/2015 1718     No results found for: TSH BNP (last 3 results) No results for input(s): BNP in the last 8760 hours.  ProBNP (last 3 results) No results for input(s): PROBNP in the  last 8760 hours.  Cardiac Panel (last 3 results) No results for input(s): CKTOTAL, CKMB, TROPONINI, RELINDX in the last 72 hours.  Iron/TIBC/Ferritin/ %Sat No results found for: IRON, TIBC, FERRITIN, IRONPCTSAT   EKG Orders placed or performed during  the hospital encounter of 08/22/15  . EKG  . EKG 12-Lead  . EKG 12-Lead  . EKG 12-Lead  . EKG 12-Lead     Prior Assessment and Plan Problem List as of 09/18/2015      Cardiovascular and Mediastinum   Non-ST elevation MI (NSTEMI) ([facility])   Hypertension   Chronic systolic HF (heart failure) ([facility])   Coronary artery disease involving native coronary artery with other forms of angina pectoris ([place])     Endocrine   Diabetes mellitus type 2, uncontrolled ([place])     Musculoskeletal and Integument   Rotator cuff syndrome of left shoulder   Right knee meniscal tear   Medial meniscus, posterior horn derangement   Lateral meniscus derangement   OA (osteoarthritis) of knee   Osteoarthritis, knee     Other   S/P right knee arthroscopy   Knee pain   Knee stiffness   Pain in the chest   Hyperlipidemia   Hyperglycemia   Morbid obesity ([facility])   S/P CABG x 4       Imaging: Dg Chest 2 View  09/03/2015   CLINICAL DATA:  Chest soreness and shortness of breath this morning status post CABG.  EXAM: CHEST  2 VIEW  COMPARISON:  Chest x-rays dated 09/02/2015, 08/30/2015, and 08/22/2015.  FINDINGS: Cardiomediastinal silhouette is stable in size and configuration with mild cardiomegaly. Median sternotomy wires intact and stable in alignment.  Persistent opacity at the left lung base is probably atelectasis and/or small effusion. Lungs otherwise clear. Lung aeration is improved in the interval. Central pulmonary vascular congestion is improved.  IMPRESSION: Improved fluid status with improved aeration throughout both lungs and decreased central pulmonary vascular congestion.  Persistent opacity at the left lung base, most likely atelectasis and/or small  effusion.  Heart size is stable.   Electronically Signed   By: Franki Cabot M.D.   On: 09/03/2015 08:26   Dg Chest 2 View  08/22/2015   CLINICAL DATA:  Mid chest pressure, diaphoresis, shortness of breath onset 1.5 hours ago.  EXAM: CHEST  2 VIEW  COMPARISON:  10/19/2013  FINDINGS: Normal heart size and pulmonary vascularity. No focal airspace disease or consolidation in the lungs. No blunting of costophrenic angles. No pneumothorax. Mediastinal contours appear intact. Degenerative changes in the spine.  IMPRESSION: No active cardiopulmonary disease.   Electronically Signed   By: Lucienne Capers M.D.   On: 08/22/2015 21:26   Dg Chest Port 1 View  09/02/2015   CLINICAL DATA:  Status post CABG.  EXAM: PORTABLE CHEST - 1 VIEW  COMPARISON:  Yesterday.  FINDINGS: The right jugular Swan-Ganz catheter sheath has been removed. Stable enlarged cardiac silhouette. No significant change in mild atelectasis on the left. Median sternotomy wires and surgical clips at the thoracic inlet. No acute bony abnormality. No pneumothorax.  IMPRESSION: Stable cardiomegaly and left lung atelectasis.   Electronically Signed   By: Claudie Revering M.D.   On: 09/02/2015 08:19   Dg Chest Port 1 View  09/01/2015   CLINICAL DATA:  CABG.  EXAM: PORTABLE CHEST - 1 VIEW  COMPARISON:  Nineteen 2016.  FINDINGS: Right IJ sheath in stable position. Prior CABG. Persistent cardiomegaly. Low lung volumes with basilar atelectasis. Mild bilateral from interstitial prominence. Mild component congestive heart failure cannot be excluded. Small left pleural effusion cannot be completely excluded. Thorax .  IMPRESSION: 1. Right IJ sheath in stable position. 2. Prior CABG.  Persistent cardiomegaly. 3. Low lung volumes with bibasilar atelectasis. Mild bilateral pulmonary  interstitial prominence noted. A mild component of congestive heart failure cannot be excluded. Small bilateral pleural effusions cannot be excluded .   Electronically Signed   By: [place]   On: 09/01/2015 07:42   Dg Chest Port 1 View  08/31/2015   CLINICAL DATA:  Bypass surgery.  EXAM: PORTABLE CHEST - 1 VIEW  COMPARISON:  08/30/2015  FINDINGS: The Swan-Ganz catheter has been removed. The right IJ Cordis is still in place. The mediastinal drain tubes and left chest tube have been removed. No pneumothorax. Very low lung volumes with vascular crowding and atelectasis. No edema or pleural effusion.  IMPRESSION: Removal of Swan-Ganz catheter, left-sided chest tube and mediastinal drain tubes. No pneumothorax.  Very low lung volumes with vascular crowding and atelectasis.   Electronically Signed   By: Marijo Sanes M.D.   On: 08/31/2015 09:03   Dg Chest Port 1 View  08/30/2015   CLINICAL DATA:  Coronary artery bypass surgery.  EXAM: PORTABLE CHEST - 1 VIEW  COMPARISON:  08/29/2015  FINDINGS: The endotracheal tube has been removed. The NG tube is been removed. The Swan-Ganz catheter is stable. The left chest tube is stable no pneumothorax. Stable cardiac enlargement and prominent mediastinal contours with persistent perihilar atelectasis. Suspect a small left pleural effusion and left basilar atelectasis.  IMPRESSION: Removal of ETT and NG tube.  The Swan-Ganz catheter is stable.  Stable left-sided chest tube without pneumothorax.  Persistent perihilar and left basilar atelectasis and possible small left effusion.   Electronically Signed   By: Marijo Sanes M.D.   On: 08/30/2015 09:15   Dg Chest Port 1 View  08/29/2015   CLINICAL DATA:  Status post coronary artery bypass graft x4.  EXAM: PORTABLE CHEST - 1 VIEW  COMPARISON:  August 22, 2015.  FINDINGS: Endotracheal tube is in grossly good position with distal tip 3 cm above the carina. Left-sided chest tube is noted without pneumothorax. Nasogastric tube is seen entering the stomach. Right internal jugular Swan-Ganz catheter is noted with distal tip in expected position of main pulmonary artery. Mild central pulmonary vascular  congestion is noted.  IMPRESSION: Endotracheal tube in grossly good position. Left-sided chest tube is noted without pneumothorax.   Electronically Signed   By: Marijo Conception, M.D.   On: 08/29/2015 13:31

## 2015-09-30 ENCOUNTER — Encounter (HOSPITAL_COMMUNITY): Payer: Self-pay

## 2015-09-30 ENCOUNTER — Encounter (HOSPITAL_COMMUNITY)
Admission: RE | Admit: 2015-09-30 | Discharge: 2015-09-30 | Disposition: A | Discharge status: Home / Self Care | Payer: Federal, State, Local not specified - PPO | Source: Ambulatory Visit | Attending: Cardiology | Admitting: Cardiology

## 2015-09-30 VITALS — BP 120/84 | HR 105 | Ht 70.0 in | Wt 251.0 lb

## 2015-09-30 DIAGNOSIS — I214 Non-ST elevation (NSTEMI) myocardial infarction: Secondary | ICD-10-CM | POA: Diagnosis not present

## 2015-09-30 DIAGNOSIS — I2510 Atherosclerotic heart disease of native coronary artery without angina pectoris: Secondary | ICD-10-CM | POA: Diagnosis not present

## 2015-09-30 DIAGNOSIS — Z951 Presence of aortocoronary bypass graft: Secondary | ICD-10-CM | POA: Insufficient documentation

## 2015-09-30 NOTE — Progress Notes (Signed)
Patient arrived for 1st visit/orientation/education at 1420. Patient was referred to CR by Dr. Harl Bowie due to CABG (Z95.1) and MI (I24.4). During orientation advised patient on arrival and appointment times what to wear, what to do before, during and after exercise. Reviewed attendance and class policy. Talked about inclement weather and class consultation policy. Pt is scheduled to return Cardiac Rehab on October 03, 2015 at 0815. Pt was advised to come to class 5 minutes before class starts. He was also given instructions on meeting with the dietician and attending the Family Structure classes. Pt is eager to get started. Patient had PHQ 9 score of 2.  No need for counseling at this time.  Patient was able to complete 6 minute walk test. Patient was measured for the equipment. Discussed equipment safety with patient. Took patient pre-anthropometric measurements. Patient finished visit at 1540.

## 2015-09-30 NOTE — Patient Instructions (Signed)
Pt has finished orientation and is scheduled to start CR on October 03, 2015 at 0815. Pt has been instructed to arrive to class 15 minutes early for scheduled class. Pt has been instructed to wear comfortable clothing and shoes with rubber soles. Pt has been told to take their medications 1 hour prior to coming to class.  If the patient is not going to attend class, he has been instructed to call.

## 2015-09-30 NOTE — Progress Notes (Signed)
Cardiac/Pulmonary Rehab Medication Review by a Pharmacist  Does the patient  feel that his/her medications are working for him/her?  yes  Has the patient been experiencing any side effects to the medications prescribed?  no  Does the patient measure his/her own blood pressure or blood glucose at home?  yes   Does the patient have any problems obtaining medications due to transportation or finances?   no  Understanding of regimen: excellent Understanding of indications: excellent Potential of compliance: excellent  Questions asked to Determine Patient Understanding of Medication Regimen:  1. What is the name of the medication?  2. What is the medication used for?  3. When should it be taken?  4. How much should be taken?  5. How will you take it?  6. What side effects should you report?  Understanding Defined as: Excellent: All questions above are correct Good: Questions 1-4 are correct Fair: Questions 1-2 are correct  Poor: 1 or none of the above questions are correct   Pharmacist comments: Pt is not having any side effects.  Pt does monitor BP and blood sugar at home.  Understands regimen very well.  [name] A 09/30/2015 2:34 PM

## 2015-10-03 ENCOUNTER — Encounter (HOSPITAL_COMMUNITY)
Admission: RE | Admit: 2015-10-03 | Discharge: 2015-10-03 | Disposition: A | Discharge status: Home / Self Care | Payer: Federal, State, Local not specified - PPO | Source: Ambulatory Visit | Attending: Cardiology | Admitting: Cardiology

## 2015-10-03 DIAGNOSIS — I214 Non-ST elevation (NSTEMI) myocardial infarction: Secondary | ICD-10-CM | POA: Diagnosis not present

## 2015-10-06 ENCOUNTER — Encounter (HOSPITAL_COMMUNITY)
Admission: RE | Admit: 2015-10-06 | Discharge: 2015-10-06 | Disposition: A | Discharge status: Home / Self Care | Payer: Federal, State, Local not specified - PPO | Source: Ambulatory Visit | Attending: Cardiology | Admitting: Cardiology

## 2015-10-06 DIAGNOSIS — I214 Non-ST elevation (NSTEMI) myocardial infarction: Secondary | ICD-10-CM | POA: Diagnosis not present

## 2015-10-07 ENCOUNTER — Ambulatory Visit: Payer: Federal, State, Local not specified - PPO

## 2015-10-07 ENCOUNTER — Ambulatory Visit: Payer: Federal, State, Local not specified - PPO | Admitting: Thoracic Surgery (Cardiothoracic Vascular Surgery)

## 2015-10-08 ENCOUNTER — Encounter (HOSPITAL_COMMUNITY)
Admission: RE | Admit: 2015-10-08 | Discharge: 2015-10-08 | Disposition: A | Discharge status: Home / Self Care | Payer: Federal, State, Local not specified - PPO | Source: Ambulatory Visit | Attending: Cardiology | Admitting: Cardiology

## 2015-10-08 DIAGNOSIS — I214 Non-ST elevation (NSTEMI) myocardial infarction: Secondary | ICD-10-CM | POA: Diagnosis not present

## 2015-10-08 NOTE — Progress Notes (Signed)
Cardiac Rehabilitation Program Outcomes Report   Orientation:  09/30/15 Graduate Date:  tbd Discharge Date:  tbd # of sessions completed: 3  Cardiologist: [place] Family MD:  Bethann Berkshire Time:  0815  A.  Exercise Program:  Tolerates exercise @ 3.73 METS for 15 minutes and Walk Test Results:  Pre: 2.37 mets  B.  Mental Health:  Good mental attitude and PHQ-9: 2. Score does not warrant counseling.   C.  Education/Instruction/Skills  Accurately checks own pulse.  Rest:  85  Exercise:  113  Uses Perceived Exertion Scale and/or Dyspnea Scale  D.  Nutrition/Weight Control/Body Composition:  Adherence to prescribed nutrition program: fair    E.  Blood Lipids   No results found for: CHOL, HDL, LDLCALC, LDLDIRECT, TRIG, CHOLHDL  F.  Lifestyle Changes:  Making positive lifestyle changes and Not smoking:  Quit Never Smoker  G.  Symptoms noted with exercise:  Asymptomatic  Report Completed By:  Stevphen Rochester RN   Comments:  This is the patients first week progress note for [facility].

## 2015-10-10 ENCOUNTER — Encounter (HOSPITAL_COMMUNITY)
Admission: RE | Admit: 2015-10-10 | Discharge: 2015-10-10 | Disposition: A | Discharge status: Home / Self Care | Payer: Federal, State, Local not specified - PPO | Source: Ambulatory Visit | Attending: Cardiology | Admitting: Cardiology

## 2015-10-10 ENCOUNTER — Other Ambulatory Visit: Payer: Self-pay | Admitting: Thoracic Surgery (Cardiothoracic Vascular Surgery)

## 2015-10-10 DIAGNOSIS — I214 Non-ST elevation (NSTEMI) myocardial infarction: Secondary | ICD-10-CM | POA: Diagnosis not present

## 2015-10-10 DIAGNOSIS — Z951 Presence of aortocoronary bypass graft: Secondary | ICD-10-CM

## 2015-10-13 ENCOUNTER — Encounter (HOSPITAL_COMMUNITY)
Admission: RE | Admit: 2015-10-13 | Discharge: 2015-10-13 | Disposition: A | Discharge status: Home / Self Care | Payer: Federal, State, Local not specified - PPO | Source: Ambulatory Visit | Attending: Cardiology | Admitting: Cardiology

## 2015-10-13 ENCOUNTER — Ambulatory Visit
Admission: RE | Admit: 2015-10-13 | Discharge: 2015-10-13 | Disposition: A | Discharge status: Home / Self Care | Payer: Federal, State, Local not specified - PPO | Source: Ambulatory Visit | Attending: Thoracic Surgery (Cardiothoracic Vascular Surgery) | Admitting: Thoracic Surgery (Cardiothoracic Vascular Surgery)

## 2015-10-13 ENCOUNTER — Ambulatory Visit (INDEPENDENT_AMBULATORY_CARE_PROVIDER_SITE_OTHER): Payer: Self-pay | Admitting: Physician Assistant

## 2015-10-13 VITALS — BP 90/60 | HR 91 | Resp 16 | Ht 70.0 in | Wt 245.0 lb

## 2015-10-13 DIAGNOSIS — Z951 Presence of aortocoronary bypass graft: Secondary | ICD-10-CM

## 2015-10-13 DIAGNOSIS — I214 Non-ST elevation (NSTEMI) myocardial infarction: Secondary | ICD-10-CM | POA: Diagnosis not present

## 2015-10-13 DIAGNOSIS — I2510 Atherosclerotic heart disease of native coronary artery without angina pectoris: Secondary | ICD-10-CM

## 2015-10-13 NOTE — Progress Notes (Signed)
HPI: Patient returns for routine postoperative follow-up having undergone CABG x 4 on 08/29/2015. The patient's early postoperative recovery while in the hospital was unremarkable.  Since hospital discharge the patient reports he has been doing very well.  He does have some sensitivity over the left side of his chest.  He also states he has 2 "bumps" that he has noticed under his incision.  They do not bother him.  He is participating with cardiac rehab without difficulty.  His wife is with him and is concerned about his BP being low and him being treated for high blood pressure.  I explained to her the benefits of taking a beta blocker post operatively and she understood this.  The patient's SBP has been in the 90s post cardiac rehab sessions and patient is asymptomatic.  She also states she is concerned because his blood sugars have been low.  When asking he patient he states his sugars have been in the low 100's.  I explained this is a very good sugar level and to record his daily sugars so his PCP can accurately adjust insulin as needed.  However, at this time if his sugars are this well controlled I don't think adjustment would be needed.   Current Outpatient Prescriptions  Medication Sig Dispense Refill  . aspirin EC 325 MG EC tablet Take 1 tablet (325 mg total) by mouth daily. 30 tablet 0  . chlorpheniramine-HYDROcodone (TUSSIONEX) 10-8 MG/5ML SUER Take 5 mLs by mouth every 6 (six) hours as needed for cough. 140 mL 0  . guaiFENesin (MUCINEX) 600 MG 12 hr tablet Take 2 tablets (1,200 mg total) by mouth 2 (two) times daily as needed.    . hydrochlorothiazide 25 MG tablet Take 12.5 mg by mouth daily.     . insulin lispro protamine-lispro (HUMALOG 75/25 MIX) (75-25) 100 UNIT/ML SUSP injection Inject 70 Units into the skin 2 (two) times daily with a meal.    . KOMBIGLYZE XR 2.04-999 MG TB24 Take 1 tablet by mouth 2 (two) times daily.     [name] loratadine (CLARITIN) 10 MG tablet Take 10 mg by mouth daily.     . metoprolol tartrate (LOPRESSOR) 25 MG tablet Take 0.5 tablets (12.5 mg total) by mouth 2 (two) times daily. 90 tablet 3  . Multiple Vitamin (MULTIVITAMIN WITH MINERALS) TABS tablet Take 1 tablet by mouth daily.    [name] olmesartan (BENICAR) 40 MG tablet Take 1 tablet (40 mg total) by mouth daily. 90 tablet 3  . oxyCODONE (OXY IR/ROXICODONE) 5 MG immediate release tablet Take 1-2 tablets (5-10 mg total) by mouth every 3 (three) hours as needed for severe pain. 30 tablet 0  . pravastatin (PRAVACHOL) 20 MG tablet Take 1 tablet (20 mg total) by mouth at bedtime. 90 tablet 3  . ranitidine (ZANTAC) 150 MG tablet Take 150 mg by mouth at bedtime as needed for heartburn (as needed for heartburn.).     [name] traMADol (ULTRAM) 50 MG tablet Take 1-2 tablets (50-100 mg total) by mouth every 4 (four) hours as needed for moderate pain. 30 tablet 0   No current facility-administered medications for this visit.    Physical Exam:  BP 90/60 mmHg  Pulse 91  Resp 16  Ht 5\' 10"  (1.778 m)  Wt 245 lb (111.131 kg)  BMI 35.15 kg/m2  SpO2 98%  Gen: no apparent distress Heart:RRR Lungs: CTA bilaterally Abd: soft non-tender, non-distended Skin: incisions well healed, 2 areas of scar tissue along inferior and superior portion of sternotomy,  no evidence on infection  Diagnostic Tests:  CXR: no significant pleural effusion, no pneumothorax   A/P:  1. S/P CABG- doing very well, some hypotension post cardiac rehab, however patient is asymptomatic, will continue current medications.  However if this becomes an issue for him we can decrease his dosage of Benicar 2.DM- continued tight glucose control 3. "Bumps" under incision- this is likely scar tissues, the areas do not increase with vagal maneuver... Patient instructed to monitor for signs of infection and enlargement 4. Activity- as tolerated, he is 6 weeks out from surgery.  He was instructed to refrain from golfing/fishing/hunting for another 6 weeks 5. RTC in 6  weeks for follow up with Dr. Elyse Hsu, PA-C [facility] [phone]

## 2015-10-15 ENCOUNTER — Encounter (HOSPITAL_COMMUNITY)
Admission: RE | Admit: 2015-10-15 | Discharge: 2015-10-15 | Disposition: A | Discharge status: Home / Self Care | Payer: Federal, State, Local not specified - PPO | Source: Ambulatory Visit | Attending: Cardiology | Admitting: Cardiology

## 2015-10-15 DIAGNOSIS — I2510 Atherosclerotic heart disease of native coronary artery without angina pectoris: Secondary | ICD-10-CM | POA: Insufficient documentation

## 2015-10-15 DIAGNOSIS — Z951 Presence of aortocoronary bypass graft: Secondary | ICD-10-CM | POA: Insufficient documentation

## 2015-10-15 DIAGNOSIS — I214 Non-ST elevation (NSTEMI) myocardial infarction: Secondary | ICD-10-CM | POA: Insufficient documentation

## 2015-10-15 NOTE — Progress Notes (Signed)
Patient was given individual home exercise plan. Handout was reviewed and discussed. Patient verbalized an understanding. 

## 2015-10-17 ENCOUNTER — Encounter (HOSPITAL_COMMUNITY)
Admission: RE | Admit: 2015-10-17 | Discharge: 2015-10-17 | Disposition: A | Discharge status: Home / Self Care | Payer: Federal, State, Local not specified - PPO | Source: Ambulatory Visit | Attending: Cardiology | Admitting: Cardiology

## 2015-10-17 DIAGNOSIS — I214 Non-ST elevation (NSTEMI) myocardial infarction: Secondary | ICD-10-CM | POA: Diagnosis not present

## 2015-10-20 ENCOUNTER — Encounter (HOSPITAL_COMMUNITY)
Admission: RE | Admit: 2015-10-20 | Discharge: 2015-10-20 | Disposition: A | Discharge status: Home / Self Care | Payer: Federal, State, Local not specified - PPO | Source: Ambulatory Visit | Attending: Cardiology | Admitting: Cardiology

## 2015-10-20 DIAGNOSIS — I214 Non-ST elevation (NSTEMI) myocardial infarction: Secondary | ICD-10-CM | POA: Diagnosis not present

## 2015-10-22 ENCOUNTER — Encounter (HOSPITAL_COMMUNITY)
Admission: RE | Admit: 2015-10-22 | Discharge: 2015-10-22 | Disposition: A | Discharge status: Home / Self Care | Payer: Federal, State, Local not specified - PPO | Source: Ambulatory Visit | Attending: Cardiology | Admitting: Cardiology

## 2015-10-22 DIAGNOSIS — I214 Non-ST elevation (NSTEMI) myocardial infarction: Secondary | ICD-10-CM | POA: Diagnosis not present

## 2015-10-24 ENCOUNTER — Encounter (HOSPITAL_COMMUNITY)
Admission: RE | Admit: 2015-10-24 | Discharge: 2015-10-24 | Disposition: A | Discharge status: Home / Self Care | Payer: Federal, State, Local not specified - PPO | Source: Ambulatory Visit | Attending: Cardiology | Admitting: Cardiology

## 2015-10-24 DIAGNOSIS — I214 Non-ST elevation (NSTEMI) myocardial infarction: Secondary | ICD-10-CM | POA: Diagnosis not present

## 2015-10-27 ENCOUNTER — Encounter (HOSPITAL_COMMUNITY)
Admission: RE | Admit: 2015-10-27 | Discharge: 2015-10-27 | Disposition: A | Discharge status: Home / Self Care | Payer: Federal, State, Local not specified - PPO | Source: Ambulatory Visit | Attending: Cardiology | Admitting: Cardiology

## 2015-10-27 DIAGNOSIS — I214 Non-ST elevation (NSTEMI) myocardial infarction: Secondary | ICD-10-CM | POA: Diagnosis not present

## 2015-10-29 ENCOUNTER — Encounter (HOSPITAL_COMMUNITY)
Admission: RE | Admit: 2015-10-29 | Discharge: 2015-10-29 | Disposition: A | Discharge status: Home / Self Care | Payer: Federal, State, Local not specified - PPO | Source: Ambulatory Visit | Attending: Cardiology | Admitting: Cardiology

## 2015-10-29 DIAGNOSIS — I214 Non-ST elevation (NSTEMI) myocardial infarction: Secondary | ICD-10-CM | POA: Diagnosis not present

## 2015-10-30 ENCOUNTER — Other Ambulatory Visit: Payer: Self-pay

## 2015-10-31 ENCOUNTER — Encounter (HOSPITAL_COMMUNITY)
Admission: RE | Admit: 2015-10-31 | Discharge: 2015-10-31 | Disposition: A | Discharge status: Home / Self Care | Payer: Federal, State, Local not specified - PPO | Source: Ambulatory Visit | Attending: Cardiology | Admitting: Cardiology

## 2015-10-31 DIAGNOSIS — I214 Non-ST elevation (NSTEMI) myocardial infarction: Secondary | ICD-10-CM | POA: Diagnosis not present

## 2015-11-03 ENCOUNTER — Encounter (HOSPITAL_COMMUNITY)
Admission: RE | Admit: 2015-11-03 | Discharge: 2015-11-03 | Disposition: A | Discharge status: Home / Self Care | Payer: Federal, State, Local not specified - PPO | Source: Ambulatory Visit | Attending: Cardiology | Admitting: Cardiology

## 2015-11-03 DIAGNOSIS — I214 Non-ST elevation (NSTEMI) myocardial infarction: Secondary | ICD-10-CM | POA: Diagnosis not present

## 2015-11-04 ENCOUNTER — Ambulatory Visit (INDEPENDENT_AMBULATORY_CARE_PROVIDER_SITE_OTHER): Payer: Federal, State, Local not specified - PPO | Admitting: Neurology

## 2015-11-04 ENCOUNTER — Encounter: Payer: Self-pay | Admitting: Neurology

## 2015-11-04 VITALS — BP 132/80 | HR 88 | Resp 20 | Ht 70.0 in | Wt 259.0 lb

## 2015-11-04 DIAGNOSIS — G4710 Hypersomnia, unspecified: Secondary | ICD-10-CM

## 2015-11-04 DIAGNOSIS — R0683 Snoring: Secondary | ICD-10-CM | POA: Diagnosis not present

## 2015-11-04 DIAGNOSIS — G4730 Sleep apnea, unspecified: Secondary | ICD-10-CM | POA: Diagnosis not present

## 2015-11-04 NOTE — Progress Notes (Signed)
SLEEP MEDICINE CLINIC   Provider:  Larey Seat, M D  Referring Provider: [name], MD Primary Care Physician:  Purvis Kilts, MD  Chief Complaint  [name] presents with  . New [name] (Initial Visit)    denies snoring,  his cardiologist recommended having a sleep study, rm 11, alone    HPI:  [name] is a 54 y.o. male , seen here as a referral from Dr. Hilma Favors for a sleep evaluation.       [name] states that he is here because his physician and his spouse are concerned about his sleep apnea. About 7 weeks ago he had presented with chest tightness and was told that he had significant coronary artery disease. Some of his vessels were 95% occluded. He underwent bypass surgery on 08-29-15, and it was in Christopher hospital that he was observed having apnea by his nurses as well as by his spouse. I would like to allude that [name] has other risk factors such as diabetes, hypertension, hypercholesterolemia and that he used to be morbidly obese but lost about 80 pounds within Christopher last 12 months. [name] acknowledges that he feels fatigued and daytime sleepy also this varies day by day. Often his sleep is not refreshing nonrestorative. His wife has noticed him to snore as well as witnessed apneas.  Sleep habits are as follows: [name] retreats usually between 9 and 10 at night to Christopher bedroom, he is promptly asleep. Christopher bedroom is described as core, quiet and dark. He shares Christopher bedroom in bed with his wife. His wife has not mentioned any restlessness kicking or thrashing. He prefers to sleep on his side and states that he cannot sleep on his back. He usually wakes up every 2 hours or so goes to Christopher bathroom. 2-3 times at night. He goes promptly to sleep again. He rises in Christopher morning at 6 AM and wakes spontaneously without alarm. He has not noticed a dry mouth in Christopher morning, he may sometimes have a morning headache. He reports no numbness, no jaw pain. He has  never been woken up by headaches from sleep. He reports that he dreams also his dreams are not acted out upon, nor are extremely vivid or Water quality scientist. When he wakes up he is often on his back and not on his side.   Sleep medical history and family sleep history: [name] father suffered from coronary artery disease and died of multiorgan failure. [name] younger brother also has undergone bypass surgery for coronary artery disease. His brother also has been diagnosed with obstructive sleep apnea.   Social history: married, disabled due to heart disease. He does not use tobacco products and does not drink alcohol, he drinks mainly water and has 1 or 2 coffees in Christopher morning. No soda or iced tea.    Review of Systems: Out of a complete 14 system review, [name] complains of only Christopher following symptoms, and all other reviewed systems are negative. Coughing, joint pain, birth marks, wife has reported snoring and apnea to be witnessed. Daytime fatigue and sleepiness.  Epworth score 11 , Fatigue severity score 42  .   Social History   Social History  . Marital Status: Married    Spouse Name: N/A  . Number of Children: N/A  . Years of Education: college   Occupational History  . maintenance tech    Social History Main Topics  . Smoking status: Never Smoker   .  Smokeless tobacco: Not on file  . Alcohol Use: No  . Drug Use: No  . Sexual Activity: Not on file   Other Topics Concern  . Not on file   Social History Narrative    Family History  Problem Relation Age of Onset  . Arthritis    . Cancer    . Diabetes    . Anesthesia problems Neg Hx   . Hypotension Neg Hx   . Malignant hyperthermia Neg Hx   . Pseudochol deficiency Neg Hx   . CAD Father   . Diabetes Mellitus II Father   . CAD Brother   . Diabetes Mellitus II Brother     Past Medical History  Diagnosis Date  . HTN (hypertension)   . Diabetes mellitus   . High cholesterol   .  Arthritis     Past Surgical History  Procedure Laterality Date  . Appendectomy    . Right biceps tendon    . Right hand nerve damage    . Left knee arthroscopy    . Knee arthroscopy  left  . Cardiac catheterization N/A 08/25/2015    Procedure: Left Heart Cath and Coronary Angiography;  Surgeon: Belva Crome, MD;  Location: [facility];  Service: Cardiovascular;  Laterality: N/A;  . Coronary artery bypass graft N/A 08/29/2015    Procedure: CORONARY ARTERY BYPASS GRAFTING (CABG);  Surgeon: [name], MD;  Location: [facility];  Service: Open Heart Surgery;  Laterality: N/A;  . Tee without cardioversion N/A 08/29/2015    Procedure: TRANSESOPHAGEAL ECHOCARDIOGRAM (TEE);  Surgeon: [name], MD;  Location: [facility];  Service: Open Heart Surgery;  Laterality: N/A;    Current Outpatient Prescriptions  Medication Sig Dispense Refill  . aspirin EC 325 MG EC tablet Take 1 tablet (325 mg total) by mouth daily. 30 tablet 0  . benzonatate (TESSALON) 100 MG capsule 100 mg 3 (three) times daily.    . chlorpheniramine-HYDROcodone (TUSSIONEX) 10-8 MG/5ML SUER Take 5 mLs by mouth every 6 (six) hours as needed for cough. 140 mL 0  . guaiFENesin (MUCINEX) 600 MG 12 hr tablet Take 2 tablets (1,200 mg total) by mouth 2 (two) times daily as needed.    . hydrochlorothiazide 25 MG tablet Take 12.5 mg by mouth daily.     . insulin lispro protamine-lispro (HUMALOG 75/25 MIX) (75-25) 100 UNIT/ML SUSP injection Inject 70 Units into Christopher skin 2 (two) times daily with a meal.    . KOMBIGLYZE XR 2.04-999 MG TB24 Take 1 tablet by mouth 2 (two) times daily.     [name] loratadine (CLARITIN) 10 MG tablet Take 10 mg by mouth daily.    . metoprolol tartrate (LOPRESSOR) 25 MG tablet Take 0.5 tablets (12.5 mg total) by mouth 2 (two) times daily. 90 tablet 3  . Multiple Vitamin (MULTIVITAMIN WITH MINERALS) TABS tablet Take 1 tablet by mouth daily.    [name] olmesartan (BENICAR) 40 MG tablet Take 1 tablet (40 mg total)  by mouth daily. 90 tablet 3  . oxyCODONE (OXY IR/ROXICODONE) 5 MG immediate release tablet Take 1-2 tablets (5-10 mg total) by mouth every 3 (three) hours as needed for severe pain. 30 tablet 0  . pravastatin (PRAVACHOL) 20 MG tablet Take 1 tablet (20 mg total) by mouth at bedtime. 90 tablet 3  . ranitidine (ZANTAC) 150 MG tablet Take 150 mg by mouth at bedtime as needed for heartburn (as needed for heartburn.).     [name] traMADol (ULTRAM) 50 MG tablet  Take 1-2 tablets (50-100 mg total) by mouth every 4 (four) hours as needed for moderate pain. 30 tablet 0   No current facility-administered medications for this visit.    Allergies as of 11/04/2015  . (No Known Allergies)    Vitals: BP 132/80 mmHg  Pulse 88  Resp 20  Ht 5\' 10"  (1.778 m)  Wt 259 lb (117.482 kg)  BMI 37.16 kg/m2 Last Weight:  Wt Readings from Last 1 Encounters:  11/04/15 259 lb (117.482 kg)   [id number] mass index is 37.16 kg/(m^2).     Last Height:   Ht Readings from Last 1 Encounters:  11/04/15 5\' 10"  (1.778 m)    Physical exam:  General: [name] is awake, alert and appears not in acute distress. [name] is well groomed. Head: Normocephalic, atraumatic. Neck is supple. Mallampati1 ,  neck circumference:18.25 . Nasal airflow intact , TMJ is not  evident . Retrognathia is not seen. Crowded dental status/ Cardiovascular:  Regular rate and rhythm , without  murmurs or carotid bruit, and without distended neck veins. Respiratory: Lungs are clear to auscultation. Skin:  Without evidence of edema, or rash Trunk: BMI is  [name]'s posture is erect.   Neurologic exam : [name] is awake and alert, oriented to place and time.   Memory subjective described as intact.  Attention span & concentration ability appears normal.  Speech is fluent,  without  dysarthria, dysphonia or aphasia.  Mood and affect are appropriate.  Cranial nerves: Pupils are equal and briskly reactive to light. Funduscopic exam without   evidence of pallor or edema. Extraocular movements  in vertical and horizontal planes intact and without nystagmus. Visual fields by finger perimetry are intact. Hearing to finger rub intact.   Facial sensation intact to fine touch.  Facial motor strength is symmetric and tongue and uvula move midline. Shoulder shrug was symmetrical.   Motor exam:  Normal tone, muscle bulk and symmetric strength in all extremities.  Sensory:  Fine touch, pinprick and vibration were tested in all extremities. Proprioception tested in Christopher upper extremities was normal.  Coordination: Rapid alternating movements in Christopher fingers/hands. Finger-to-nose maneuver intact without evidence of ataxia, dysmetria or tremor.  Gait and station: [name] walks without assistive device and is able unassisted to climb up to Christopher exam table. Strength within normal limits.  Stance is stable and normal. Turns with 3.5  Steps. Romberg testing is  negative.  Deep tendon reflexes: in Christopher  upper and lower extremities are symmetric and intact. Babinski maneuver response is  downgoing.  [name] was advised of Christopher nature of Christopher diagnosed sleep disorder , Christopher treatment options and risks for general a health and wellness arising from not treating Christopher condition.  I spent more than 40 minutes of face to face time with [name]. Greater than 50% of time was spent in counseling and coordination of care. We have discussed Christopher diagnosis and differential and I answered [name]'s questions.     Assessment:  After physical and neurologic examination, review of laboratory studies.   Personal review of imaging studies, reports of other /same  Imaging studies, results of polysomnography/ neurophysiology testing and pre-existing records as far as provided in visit. I reviewed hospital notes, my assessment is:    1) [name] does have risk factors for obstructive sleep apnea including he still elevated body mass index and is larger neck  circumference, but his upper airway is Mallampati grade 1. It is surprising that he  would be such a loud snorer. Apparently he finds himself sometimes sleeping supine when he wakes up which would explain that apneas were promoted in supine sleep position and that his waking up this likely facilitated by a respiratory arousal. Nurses, spouse and family doctor have all eluded to his hospitalization stay when apnea was clearly witnessed while he was on cardiac monitoring.  2) [name] also had hypoxemia related to Christopher apnea during his hospitalization and remembers that Christopher monitors were beeping frequently. It is depending on Christopher degree of obstructive sleep apnea and if associated hypoxemia will be found to decide which kind of treatment to use. A REM sleep accentuated sleep apnea with hypoxemia is usually treated with CPAP. A mild apnea with out significant drops in oxygen saturation is usually susceptible to a dental device which also will address snoring.  3)nocturia, may be related to obstructive sleep apnea, can also be related to diabetes or fluid intake. [name] will try to refrain himself from drinking fluids after 8 PM. Hba1c 6.7.     Plan:  Treatment plan and additional workup :  [name] will be seen for an attended sleep study, I will add capnography if available. Revisit after sleep study.BCBS.     Asencion Partridge Klayten Jolliff MD  11/04/2015   CC: [name], [place],  [id number]

## 2015-11-05 ENCOUNTER — Encounter (HOSPITAL_COMMUNITY)
Admission: RE | Admit: 2015-11-05 | Discharge: 2015-11-05 | Disposition: A | Discharge status: Home / Self Care | Payer: Federal, State, Local not specified - PPO | Source: Ambulatory Visit | Attending: Cardiology | Admitting: Cardiology

## 2015-11-05 DIAGNOSIS — I214 Non-ST elevation (NSTEMI) myocardial infarction: Secondary | ICD-10-CM | POA: Diagnosis not present

## 2015-11-07 ENCOUNTER — Encounter (HOSPITAL_COMMUNITY): Payer: Federal, State, Local not specified - PPO

## 2015-11-10 ENCOUNTER — Encounter (HOSPITAL_COMMUNITY)
Admission: RE | Admit: 2015-11-10 | Discharge: 2015-11-10 | Disposition: A | Discharge status: Home / Self Care | Payer: Federal, State, Local not specified - PPO | Source: Ambulatory Visit | Attending: Cardiology | Admitting: Cardiology

## 2015-11-10 DIAGNOSIS — I214 Non-ST elevation (NSTEMI) myocardial infarction: Secondary | ICD-10-CM | POA: Diagnosis not present

## 2015-11-12 ENCOUNTER — Encounter (HOSPITAL_COMMUNITY)
Admission: RE | Admit: 2015-11-12 | Discharge: 2015-11-12 | Disposition: A | Discharge status: Home / Self Care | Payer: Federal, State, Local not specified - PPO | Source: Ambulatory Visit | Attending: Cardiology | Admitting: Cardiology

## 2015-11-12 DIAGNOSIS — I214 Non-ST elevation (NSTEMI) myocardial infarction: Secondary | ICD-10-CM | POA: Diagnosis not present

## 2015-11-12 NOTE — Progress Notes (Signed)
Cardiac Rehabilitation Program Outcomes Report   Orientation:  09/30/15 Graduate Date:  tbd Discharge Date:  tbd # of sessions completed: 18  Cardiologist: Jefm Bryant MD:  Haynes Bast Time:  0815  A.  Exercise Program:  Tolerates exercise @ 2.60 METS for 15 minutes  B.  Mental Health:  Good mental attitude  C.  Education/Instruction/Skills  Accurately checks own pulse.  Rest:  89  Exercise:  126 and Knows THR for exercise  Uses Perceived Exertion Scale and/or Dyspnea Scale  D.  Nutrition/Weight Control/Body Composition:  Adherence to prescribed nutrition program: good    E.  Blood Lipids   No results found for: CHOL, HDL, LDLCALC, LDLDIRECT, TRIG, CHOLHDL  F.  Lifestyle Changes:  Making positive lifestyle changes and Not smoking:  Quit Never Smoker  G.  Symptoms noted with exercise:  Asymptomatic  Report Completed By:  Stevphen Rochester RN   Comments:  This is the patients halfway progress note for [facility]. Patient is progressing well.

## 2015-11-14 ENCOUNTER — Encounter (HOSPITAL_COMMUNITY)
Admission: RE | Admit: 2015-11-14 | Discharge: 2015-11-14 | Disposition: A | Discharge status: Home / Self Care | Payer: Federal, State, Local not specified - PPO | Source: Ambulatory Visit | Attending: Cardiology | Admitting: Cardiology

## 2015-11-14 DIAGNOSIS — Z951 Presence of aortocoronary bypass graft: Secondary | ICD-10-CM | POA: Diagnosis not present

## 2015-11-14 DIAGNOSIS — I214 Non-ST elevation (NSTEMI) myocardial infarction: Secondary | ICD-10-CM | POA: Diagnosis present

## 2015-11-14 DIAGNOSIS — I2510 Atherosclerotic heart disease of native coronary artery without angina pectoris: Secondary | ICD-10-CM | POA: Diagnosis not present

## 2015-11-17 ENCOUNTER — Encounter (HOSPITAL_COMMUNITY)
Admission: RE | Admit: 2015-11-17 | Discharge: 2015-11-17 | Disposition: A | Discharge status: Home / Self Care | Payer: Federal, State, Local not specified - PPO | Source: Ambulatory Visit | Attending: Cardiology | Admitting: Cardiology

## 2015-11-17 DIAGNOSIS — I214 Non-ST elevation (NSTEMI) myocardial infarction: Secondary | ICD-10-CM | POA: Diagnosis not present

## 2015-11-19 ENCOUNTER — Encounter (HOSPITAL_COMMUNITY)
Admission: RE | Admit: 2015-11-19 | Discharge: 2015-11-19 | Disposition: A | Discharge status: Home / Self Care | Payer: Federal, State, Local not specified - PPO | Source: Ambulatory Visit | Attending: Cardiology | Admitting: Cardiology

## 2015-11-19 DIAGNOSIS — I214 Non-ST elevation (NSTEMI) myocardial infarction: Secondary | ICD-10-CM | POA: Diagnosis not present

## 2015-11-21 ENCOUNTER — Encounter (HOSPITAL_COMMUNITY)
Admission: RE | Admit: 2015-11-21 | Discharge: 2015-11-21 | Disposition: A | Discharge status: Home / Self Care | Payer: Federal, State, Local not specified - PPO | Source: Ambulatory Visit | Attending: Cardiology | Admitting: Cardiology

## 2015-11-21 DIAGNOSIS — I214 Non-ST elevation (NSTEMI) myocardial infarction: Secondary | ICD-10-CM | POA: Diagnosis not present

## 2015-11-24 ENCOUNTER — Encounter (HOSPITAL_COMMUNITY)
Admission: RE | Admit: 2015-11-24 | Discharge: 2015-11-24 | Disposition: A | Discharge status: Home / Self Care | Payer: Federal, State, Local not specified - PPO | Source: Ambulatory Visit | Attending: Cardiology | Admitting: Cardiology

## 2015-11-24 DIAGNOSIS — I214 Non-ST elevation (NSTEMI) myocardial infarction: Secondary | ICD-10-CM | POA: Diagnosis not present

## 2015-11-25 ENCOUNTER — Ambulatory Visit (INDEPENDENT_AMBULATORY_CARE_PROVIDER_SITE_OTHER): Payer: Self-pay | Admitting: Thoracic Surgery (Cardiothoracic Vascular Surgery)

## 2015-11-25 ENCOUNTER — Encounter: Payer: Self-pay | Admitting: Thoracic Surgery (Cardiothoracic Vascular Surgery)

## 2015-11-25 VITALS — BP 128/82 | HR 92 | Resp 20 | Ht 70.0 in | Wt 259.0 lb

## 2015-11-25 DIAGNOSIS — I2510 Atherosclerotic heart disease of native coronary artery without angina pectoris: Secondary | ICD-10-CM

## 2015-11-25 DIAGNOSIS — Z951 Presence of aortocoronary bypass graft: Secondary | ICD-10-CM

## 2015-11-25 NOTE — Progress Notes (Signed)
[address]       [place] [address]             [phone]       HPI: Mr. Fenger returns today for scheduled follow-up visit.  He is a 54 year old gentleman who had coronary bypass grafting back in September. He was last in the office on Halloween by our physician's assistant. He is halfway through cardiac rehabilitation now. That has been going well. He does still occasionally have some pain particularly along the left side of his incision. He is not having to take any pain medication a regular basis. He has not had any recurrent angina.  Past Medical History  Diagnosis Date  . HTN (hypertension)   . Diabetes mellitus   . High cholesterol   . Arthritis        Current Outpatient Prescriptions  Medication Sig Dispense Refill  . aspirin EC 325 MG EC tablet Take 1 tablet (325 mg total) by mouth daily. 30 tablet 0  . benzonatate (TESSALON) 100 MG capsule 100 mg 3 (three) times daily.    . chlorpheniramine-HYDROcodone (TUSSIONEX) 10-8 MG/5ML SUER Take 5 mLs by mouth every 6 (six) hours as needed for cough. 140 mL 0  . guaiFENesin (MUCINEX) 600 MG 12 hr tablet Take 2 tablets (1,200 mg total) by mouth 2 (two) times daily as needed.    . hydrochlorothiazide 25 MG tablet Take 12.5 mg by mouth daily.     . insulin lispro protamine-lispro (HUMALOG 75/25 MIX) (75-25) 100 UNIT/ML SUSP injection Inject 70 Units into the skin 2 (two) times daily with a meal.    . KOMBIGLYZE XR 2.04-999 MG TB24 Take 1 tablet by mouth 2 (two) times daily.     [name] loratadine (CLARITIN) 10 MG tablet Take 10 mg by mouth daily.    . metoprolol tartrate (LOPRESSOR) 25 MG tablet Take 0.5 tablets (12.5 mg total) by mouth 2 (two) times daily. 90 tablet 3  . Multiple Vitamin (MULTIVITAMIN WITH MINERALS) TABS tablet Take 1 tablet by mouth daily.    [name] olmesartan (BENICAR) 40 MG tablet Take 1 tablet (40 mg total) by mouth daily. 90 tablet 3  . oxyCODONE (OXY IR/ROXICODONE) 5 MG immediate release  tablet Take 1-2 tablets (5-10 mg total) by mouth every 3 (three) hours as needed for severe pain. 30 tablet 0  . pravastatin (PRAVACHOL) 20 MG tablet Take 1 tablet (20 mg total) by mouth at bedtime. 90 tablet 3  . ranitidine (ZANTAC) 150 MG tablet Take 150 mg by mouth at bedtime as needed for heartburn (as needed for heartburn.).     [name] traMADol (ULTRAM) 50 MG tablet Take 1-2 tablets (50-100 mg total) by mouth every 4 (four) hours as needed for moderate pain. 30 tablet 0   No current facility-administered medications for this visit.    Physical Exam BP 128/82 mmHg  Pulse 92  Resp 20  Ht 5\' 10"  (1.778 m)  Wt 259 lb (117.482 kg)  BMI 37.16 kg/m2  SpO2 13% Obese 54 year old man in no acute distress Alert and oriented 3 with no focal neurologic deficits Cardiac regular rate and rhythm normal S1 and S2, no rubs or murmurs Lungs clear with equal breath size bilaterally Leg incisions well healed, no peripheral edema  Diagnostic Tests: I personally reviewed the chest x-ray, it shows cardiomegaly. Otherwise unremarkable.  Impression: 54 year old man who is now about 3 months out from coronary bypass grafting. He is doing well at this time.  He does still have some occasional discomfort, but nothing out of the ordinary. He has not had any recurrent angina. He has been doing well with cardiac rehabilitation. He will complete that program.  There are no restrictions on his activities.  His work involves a Radio producer. I think he'll probably be able to return to work after the first of the year.  Plan: He will follow-up with Dr. Domenic Polite in [place].  I will be happy to see him back any time if I can be of any further assistance with his care.  [name], MD [facility] [phone]

## 2015-11-26 ENCOUNTER — Encounter (HOSPITAL_COMMUNITY)
Admission: RE | Admit: 2015-11-26 | Discharge: 2015-11-26 | Disposition: A | Discharge status: Home / Self Care | Payer: Federal, State, Local not specified - PPO | Source: Ambulatory Visit | Attending: Cardiology | Admitting: Cardiology

## 2015-11-26 DIAGNOSIS — I214 Non-ST elevation (NSTEMI) myocardial infarction: Secondary | ICD-10-CM | POA: Diagnosis not present

## 2015-11-28 ENCOUNTER — Encounter (HOSPITAL_COMMUNITY)
Admission: RE | Admit: 2015-11-28 | Discharge: 2015-11-28 | Disposition: A | Discharge status: Home / Self Care | Payer: Federal, State, Local not specified - PPO | Source: Ambulatory Visit | Attending: Cardiology | Admitting: Cardiology

## 2015-11-28 DIAGNOSIS — I214 Non-ST elevation (NSTEMI) myocardial infarction: Secondary | ICD-10-CM | POA: Diagnosis not present

## 2015-11-30 ENCOUNTER — Ambulatory Visit (INDEPENDENT_AMBULATORY_CARE_PROVIDER_SITE_OTHER): Payer: Federal, State, Local not specified - PPO | Admitting: Neurology

## 2015-11-30 DIAGNOSIS — G4730 Sleep apnea, unspecified: Secondary | ICD-10-CM

## 2015-11-30 DIAGNOSIS — R0683 Snoring: Secondary | ICD-10-CM

## 2015-11-30 DIAGNOSIS — G4710 Hypersomnia, unspecified: Secondary | ICD-10-CM | POA: Diagnosis not present

## 2015-11-30 NOTE — Sleep Study (Signed)
Please see the scanned sleep study interpretation located in the Procedure tab within the Chart Review section. 

## 2015-12-01 ENCOUNTER — Encounter (HOSPITAL_COMMUNITY)
Admission: RE | Admit: 2015-12-01 | Discharge: 2015-12-01 | Disposition: A | Discharge status: Home / Self Care | Payer: Federal, State, Local not specified - PPO | Source: Ambulatory Visit | Attending: Cardiology | Admitting: Cardiology

## 2015-12-01 DIAGNOSIS — I214 Non-ST elevation (NSTEMI) myocardial infarction: Secondary | ICD-10-CM | POA: Diagnosis not present

## 2015-12-03 ENCOUNTER — Encounter (HOSPITAL_COMMUNITY)
Admission: RE | Admit: 2015-12-03 | Discharge: 2015-12-03 | Disposition: A | Discharge status: Home / Self Care | Payer: Federal, State, Local not specified - PPO | Source: Ambulatory Visit | Attending: Cardiology | Admitting: Cardiology

## 2015-12-03 ENCOUNTER — Telehealth: Payer: Self-pay

## 2015-12-03 DIAGNOSIS — G4733 Obstructive sleep apnea (adult) (pediatric): Secondary | ICD-10-CM

## 2015-12-03 DIAGNOSIS — I214 Non-ST elevation (NSTEMI) myocardial infarction: Secondary | ICD-10-CM | POA: Diagnosis not present

## 2015-12-03 NOTE — Telephone Encounter (Signed)
Spoke to pt and advised him that his sleep study results revealed osa and treatment is advised. PAP therapy is indicated. Dr. Brett Fairy recommends proceeding with a CPAP titration study to optimize therapy, and that alternative therapies, such as an oral appliance or ENT evaluation will not address the REM dependent apnea. I also advised pt that his study does reveal significant PLMS of sleep resulting in significant sleep disruption. I advised him to sleep on his side. Pt is agreeable to coming in for a cpap titration. I advised pt to lose weight, diet, and exercise if not contraindicated by his other physicians. I advised pt to not drive or operate machinery when sleepy. Pt verbalized understanding.

## 2015-12-05 ENCOUNTER — Encounter (HOSPITAL_COMMUNITY)
Admission: RE | Admit: 2015-12-05 | Discharge: 2015-12-05 | Disposition: A | Discharge status: Home / Self Care | Payer: Federal, State, Local not specified - PPO | Source: Ambulatory Visit | Attending: Cardiology | Admitting: Cardiology

## 2015-12-05 DIAGNOSIS — I214 Non-ST elevation (NSTEMI) myocardial infarction: Secondary | ICD-10-CM | POA: Diagnosis not present

## 2015-12-08 ENCOUNTER — Encounter (HOSPITAL_COMMUNITY): Payer: Federal, State, Local not specified - PPO

## 2015-12-10 ENCOUNTER — Encounter (HOSPITAL_COMMUNITY)
Admission: RE | Admit: 2015-12-10 | Discharge: 2015-12-10 | Disposition: A | Discharge status: Home / Self Care | Payer: Federal, State, Local not specified - PPO | Source: Ambulatory Visit | Attending: Cardiology | Admitting: Cardiology

## 2015-12-10 DIAGNOSIS — I214 Non-ST elevation (NSTEMI) myocardial infarction: Secondary | ICD-10-CM | POA: Diagnosis not present

## 2015-12-12 ENCOUNTER — Encounter (HOSPITAL_COMMUNITY): Payer: Federal, State, Local not specified - PPO

## 2015-12-15 ENCOUNTER — Encounter (HOSPITAL_COMMUNITY): Payer: Federal, State, Local not specified - PPO

## 2015-12-17 ENCOUNTER — Encounter (HOSPITAL_COMMUNITY): Payer: Federal, State, Local not specified - PPO

## 2015-12-18 ENCOUNTER — Encounter: Payer: Self-pay | Admitting: Cardiology

## 2015-12-18 ENCOUNTER — Ambulatory Visit (INDEPENDENT_AMBULATORY_CARE_PROVIDER_SITE_OTHER): Payer: Federal, State, Local not specified - PPO | Admitting: Cardiology

## 2015-12-18 VITALS — BP 106/68 | HR 89 | Ht 70.0 in | Wt 256.0 lb

## 2015-12-18 DIAGNOSIS — I2510 Atherosclerotic heart disease of native coronary artery without angina pectoris: Secondary | ICD-10-CM

## 2015-12-18 DIAGNOSIS — E1159 Type 2 diabetes mellitus with other circulatory complications: Secondary | ICD-10-CM | POA: Diagnosis not present

## 2015-12-18 DIAGNOSIS — G4733 Obstructive sleep apnea (adult) (pediatric): Secondary | ICD-10-CM

## 2015-12-18 DIAGNOSIS — I10 Essential (primary) hypertension: Secondary | ICD-10-CM

## 2015-12-18 DIAGNOSIS — E785 Hyperlipidemia, unspecified: Secondary | ICD-10-CM

## 2015-12-18 LAB — LIPID PANEL
Cholesterol: 166 mg/dL (ref 125–200)
HDL: 35 mg/dL — ABNORMAL LOW (ref >=40)
LDL Cholesterol: 90 mg/dL (ref <130)
Total CHOL/HDL Ratio: 4.7 Ratio (ref <=5.0)
Triglycerides: 205 mg/dL — ABNORMAL HIGH (ref <150)
VLDL: 41 mg/dL — ABNORMAL HIGH (ref <30)

## 2015-12-18 NOTE — Progress Notes (Signed)
Cardiology Office Note  Date: 12/18/2015   ID: [name], DOB 1961-04-03, MRN [id number]  PCP: Purvis Kilts, MD  Primary Cardiologist: Christopher Lesches, MD   Chief Complaint  Patient presents with  . Coronary Artery Disease    History of Present Illness: [name] is a 55 y.o. male that I am meeting for the first time in the office today. I reviewed his records and updated the chart. He was seen by [name] back in October in follow-up of CABG. He presented in September 2016 with NSTEMI and underwent cardiac catheterization at [facility] revealing multivessel disease. LVEF was 45-50% by echocardiogram. He underwent CABG with LIMA to LAD, SVG to ramus, and SVG to PDA and PL with Dr. Roxan [name].  He has been participating in cardiac rehabilitation. States that he has enjoyed this program, has 7 more visits and then may do the maintenance program. He does not report any angina with his exercise. Typically has NYHA class II dyspnea. He still describes some mild postsurgical thoracic discomfort at times. Does not quite have the stamina that he wants as yet.  He underwent a sleep study per [facility] and diagnosed with obstructive sleep apnea. He plans to have a follow up titration study and will likely start CPAP soon.  I could not locate a recent lipid panel. He has been on Pravachol for years by report. We discussed his remaining cardiac medications which are outlined below and include aspirin, HCTZ, Lopressor, and Benicar.  He follows with endocrinology for management of his type 2 diabetes mellitus. Reports stable regimen and recent hemoglobin A1c of 6.7 which was an improvement.   Past Medical History  Diagnosis Date  . Essential hypertension   . Type 2 diabetes mellitus ([place])   . Hyperlipidemia   . Arthritis   . CAD (coronary artery disease)     Multivessel disease status post CABG 08/2015    Past Surgical History  Procedure Laterality  Date  . Appendectomy    . Biceps tendon surgery Right   . Knee arthroscopy Left   . Cardiac catheterization N/A 08/25/2015    Procedure: Left Heart Cath and Coronary Angiography;  Surgeon: Belva Crome, MD;  Location: [place] [facility];  Service: Cardiovascular;  Laterality: N/A;  . Coronary artery bypass graft N/A 08/29/2015    Procedure: CORONARY ARTERY BYPASS GRAFTING (CABG);  Surgeon: [name], MD;  Location: [place];  Service: Open Heart Surgery;  Laterality: N/A;  . Tee without cardioversion N/A 08/29/2015    Procedure: TRANSESOPHAGEAL ECHOCARDIOGRAM (TEE);  Surgeon: [name], MD;  Location: [place];  Service: Open Heart Surgery;  Laterality: N/A;    Current Outpatient Prescriptions  Medication Sig Dispense Refill  . aspirin EC 325 MG EC tablet Take 1 tablet (325 mg total) by mouth daily. 30 tablet 0  . benzonatate (TESSALON) 100 MG capsule 100 mg 3 (three) times daily.    . chlorpheniramine-HYDROcodone (TUSSIONEX) 10-8 MG/5ML SUER Take 5 mLs by mouth every 6 (six) hours as needed for cough. 140 mL 0  . guaiFENesin (MUCINEX) 600 MG 12 hr tablet Take 2 tablets (1,200 mg total) by mouth 2 (two) times daily as needed.    . hydrochlorothiazide 25 MG tablet Take 12.5 mg by mouth daily.     . insulin lispro protamine-lispro (HUMALOG 75/25 MIX) (75-25) 100 UNIT/ML SUSP injection Inject 70 Units into the skin 2 (two) times daily with a meal.    .  KOMBIGLYZE XR 2.04-999 MG TB24 Take 1 tablet by mouth 2 (two) times daily.     [name] loratadine (CLARITIN) 10 MG tablet Take 10 mg by mouth daily.    . metoprolol tartrate (LOPRESSOR) 25 MG tablet Take 0.5 tablets (12.5 mg total) by mouth 2 (two) times daily. 90 tablet 3  . Multiple Vitamin (MULTIVITAMIN WITH MINERALS) TABS tablet Take 1 tablet by mouth daily.    [name] olmesartan (BENICAR) 40 MG tablet Take 1 tablet (40 mg total) by mouth daily. 90 tablet 3  . oxyCODONE (OXY IR/ROXICODONE) 5 MG immediate release tablet Take 1-2 tablets  (5-10 mg total) by mouth every 3 (three) hours as needed for severe pain. 30 tablet 0  . pravastatin (PRAVACHOL) 20 MG tablet Take 1 tablet (20 mg total) by mouth at bedtime. 90 tablet 3  . ranitidine (ZANTAC) 150 MG tablet Take 150 mg by mouth at bedtime as needed for heartburn (as needed for heartburn.).     [name] traMADol (ULTRAM) 50 MG tablet Take 1-2 tablets (50-100 mg total) by mouth every 4 (four) hours as needed for moderate pain. 30 tablet 0   No current facility-administered medications for this visit.   Allergies:  Review of patient's allergies indicates no known allergies.   Social History: The patient  reports that he has never smoked. He does not have any smokeless tobacco history on file. He reports that he does not drink alcohol or use illicit drugs.   ROS:  Please see the history of present illness. Otherwise, complete review of systems is positive for mild intermittent oh surgical neuropathic thoracic discomfort. No orthopnea or PND. No palpitations..  All other systems are reviewed and negative.   Physical Exam: VS:  BP 106/68 mmHg  Pulse 89  Ht 5\' 10"  (1.778 m)  Wt 256 lb (116.121 kg)  BMI 36.73 kg/m2  SpO2 99%, BMI Body mass index is 36.73 kg/(m^2).  Wt Readings from Last 3 Encounters:  12/18/15 256 lb (116.121 kg)  11/25/15 259 lb (117.482 kg)  11/04/15 259 lb (117.482 kg)    General: Obese male, appears comfortable at rest. HEENT: Conjunctiva and lids normal, oropharynx clear. Neck: Supple, no elevated JVP or carotid bruits, no thyromegaly. Thorax: Well-healed sternal incision. Lungs: Clear to auscultation, nonlabored breathing at rest. Cardiac: Regular rate and rhythm, no S3 or significant systolic murmur, no pericardial rub. Abdomen: Soft, nontender, bowel sounds present, no guarding or rebound. Extremities: No pitting edema, distal pulses 2+. Skin: Warm and dry. Musculoskeletal: No kyphosis. Neuropsychiatric: Alert and oriented x3, affect grossly  appropriate.  ECG: Tracing from 08/29/2015 showed sinus rhythm with decreased R wave progression and nonspecific ST changes, possibly ischemic in the inferior leads.  Recent Labwork: 08/23/2015: ALT 29; AST 71* 08/30/2015: Magnesium 2.3 09/03/2015: BUN 21*; Creatinine, Ser 1.18; Hemoglobin 10.6*; Platelets 205; Potassium 3.8; Sodium 135   Other Studies Reviewed Today:  Echocardiogram 08/24/2015: Study Conclusions  - Left ventricle: The cavity size was normal. Wall thickness was increased in a pattern of mild LVH. Systolic function was mildly reduced. The estimated ejection fraction was in the range of 45% to 50%. Left ventricular diastolic function parameters were normal.  Cardiac catheterization 08/25/2015: Conclusion    1. Mid RCA to Dist RCA lesion, 100% stenosed. 2. 1st Mrg lesion, 90% stenosed. 3. Ramus lesion, 70% stenosed. 4. Ost Cx to Dist Cx lesion, 60% stenosed. 5. Ost 1st Diag lesion, 90% stenosed. 6. Ost 2nd Diag to 2nd Diag lesion, 70% stenosed. 7. Mid LAD lesion, 90%  stenosed. 8. Post Atrio lesion, 90% stenosed. 9. Dist RCA lesion, 100% stenosed.   Severe native vessel coronary artery disease with total occlusion of the distal RCA (likely culprit for acute presentation), high-grade obstruction in the proximal to mid LAD, high-grade obstruction in the first obtuse marginal, significant stenosis in the ramus intermedius, first diagonal, and second diagonal.  LVEF 50% with marked elevation in LVEDP, consistent with acute diastolic left heart failure.  Distal right coronary is collateralized from the LAD and circumflex. The right coronary is dominant.   Assessment and Plan:  1. Multivessel CAD status post NSTEMI in September 2016, subsequently diagnosed with multivessel CAD and now status post CABG as outlined. LVEF 45-50%. He is recuperating relatively well at this time on medical therapy, and nearing the end of cardiac rehabilitation. I encouraged him to  continue with regular exercise at the completion of that program.  2. Hyperlipidemia, on Pravachol. Follow-up lipid panel will be obtained.  3. Essential hypertension, blood pressure is well controlled today.  4. Type 2 diabetes mellitus, followed by endocrinology. Reports recent hemoglobin A1c of 6.7.  5. Recently diagnosed obstructive sleep apnea, undergoing CPAP titration study next.  6. Obesity. Discussed weight loss, plan to continue exercise.  Current medicines were reviewed with the patient today.   Orders Placed This Encounter  Procedures  . Lipid Profile    Disposition: FU with me in 6 months.   Signed, Satira Sark, MD, [facility] 12/18/2015 8:16 AM    [facility] at [facility] [address], [place], [place] [address] Phone: [phone]; Fax: [phone]

## 2015-12-18 NOTE — Patient Instructions (Signed)
Your physician wants you to follow-up in: 6 months with Dr Ferne Reus will receive a reminder letter in the mail two months in advance. If you don't receive a letter, please call our office to schedule the follow-up appointment.   Your physician recommends that you continue on your current medications as directed. Please refer to the Current Medication list given to you today.   If you need a refill on your cardiac medications before your next appointment, please call your pharmacy.    Your physician recommends that you return for lab work in: FASTING Lipid      Thank you for choosing [facility] !

## 2015-12-19 ENCOUNTER — Encounter (HOSPITAL_COMMUNITY)
Admission: RE | Admit: 2015-12-19 | Discharge: 2015-12-19 | Disposition: A | Discharge status: Home / Self Care | Payer: Federal, State, Local not specified - PPO | Source: Ambulatory Visit | Attending: Cardiology | Admitting: Cardiology

## 2015-12-19 DIAGNOSIS — Z951 Presence of aortocoronary bypass graft: Secondary | ICD-10-CM | POA: Diagnosis not present

## 2015-12-19 DIAGNOSIS — I2510 Atherosclerotic heart disease of native coronary artery without angina pectoris: Secondary | ICD-10-CM | POA: Insufficient documentation

## 2015-12-19 DIAGNOSIS — I214 Non-ST elevation (NSTEMI) myocardial infarction: Secondary | ICD-10-CM | POA: Insufficient documentation

## 2015-12-22 ENCOUNTER — Encounter (HOSPITAL_COMMUNITY): Payer: Federal, State, Local not specified - PPO

## 2015-12-24 ENCOUNTER — Encounter (HOSPITAL_COMMUNITY)
Admission: RE | Admit: 2015-12-24 | Discharge: 2015-12-24 | Disposition: A | Discharge status: Home / Self Care | Payer: Federal, State, Local not specified - PPO | Source: Ambulatory Visit | Attending: Cardiology | Admitting: Cardiology

## 2015-12-24 DIAGNOSIS — I214 Non-ST elevation (NSTEMI) myocardial infarction: Secondary | ICD-10-CM | POA: Diagnosis not present

## 2015-12-26 ENCOUNTER — Encounter (HOSPITAL_COMMUNITY)
Admission: RE | Admit: 2015-12-26 | Discharge: 2015-12-26 | Disposition: A | Discharge status: Home / Self Care | Payer: Federal, State, Local not specified - PPO | Source: Ambulatory Visit | Attending: Cardiology | Admitting: Cardiology

## 2015-12-26 DIAGNOSIS — I214 Non-ST elevation (NSTEMI) myocardial infarction: Secondary | ICD-10-CM | POA: Diagnosis not present

## 2015-12-29 ENCOUNTER — Encounter (HOSPITAL_COMMUNITY)
Admission: RE | Admit: 2015-12-29 | Discharge: 2015-12-29 | Disposition: A | Discharge status: Home / Self Care | Payer: Federal, State, Local not specified - PPO | Source: Ambulatory Visit | Attending: Cardiology | Admitting: Cardiology

## 2015-12-29 DIAGNOSIS — I214 Non-ST elevation (NSTEMI) myocardial infarction: Secondary | ICD-10-CM | POA: Diagnosis not present

## 2015-12-30 ENCOUNTER — Ambulatory Visit (INDEPENDENT_AMBULATORY_CARE_PROVIDER_SITE_OTHER): Payer: Federal, State, Local not specified - PPO | Admitting: Neurology

## 2015-12-30 DIAGNOSIS — G4733 Obstructive sleep apnea (adult) (pediatric): Secondary | ICD-10-CM

## 2015-12-31 ENCOUNTER — Encounter (HOSPITAL_COMMUNITY)
Admission: RE | Admit: 2015-12-31 | Discharge: 2015-12-31 | Disposition: A | Discharge status: Home / Self Care | Payer: Federal, State, Local not specified - PPO | Source: Ambulatory Visit | Attending: Cardiology | Admitting: Cardiology

## 2015-12-31 DIAGNOSIS — I214 Non-ST elevation (NSTEMI) myocardial infarction: Secondary | ICD-10-CM | POA: Diagnosis not present

## 2015-12-31 NOTE — Sleep Study (Signed)
Please see the scanned sleep study interpretation located in the Procedure tab within the Chart Review section. 

## 2016-01-02 ENCOUNTER — Encounter (HOSPITAL_COMMUNITY)
Admission: RE | Admit: 2016-01-02 | Discharge: 2016-01-02 | Disposition: A | Discharge status: Home / Self Care | Payer: Federal, State, Local not specified - PPO | Source: Ambulatory Visit | Attending: Cardiology | Admitting: Cardiology

## 2016-01-02 DIAGNOSIS — I214 Non-ST elevation (NSTEMI) myocardial infarction: Secondary | ICD-10-CM | POA: Diagnosis not present

## 2016-01-03 ENCOUNTER — Other Ambulatory Visit: Payer: Self-pay | Admitting: Physician Assistant

## 2016-01-05 ENCOUNTER — Encounter (HOSPITAL_COMMUNITY)
Admission: RE | Admit: 2016-01-05 | Discharge: 2016-01-05 | Disposition: A | Discharge status: Home / Self Care | Payer: Federal, State, Local not specified - PPO | Source: Ambulatory Visit | Attending: Cardiology | Admitting: Cardiology

## 2016-01-05 DIAGNOSIS — I214 Non-ST elevation (NSTEMI) myocardial infarction: Secondary | ICD-10-CM | POA: Diagnosis not present

## 2016-01-05 NOTE — Progress Notes (Signed)
Cardiac Rehabilitation Program Outcomes Report   Orientation:  09/30/15 Graduate Date:  01/05/16 Discharge Date:  01/05/16 # of sessions completed: 36  Cardiologist: Harl Bowie Family MD:  Bethann Berkshire Time:  0815  A.  Exercise Program:  Tolerates exercise @ 3.80 METS for 15 minutes, Walk Test Results:  Post: 2.56 mets, Improved functional capacity  24.89 %, Improved  muscular strength  1.14 %, Decreased  flexibility 7.27 % and Progressed to Phase 4 maintenance program  B.  Mental Health:  Good mental attitude, Quality of Life (QOL)  changes:  Overall  -4.27 %, Health/Functioning -3.88 %, Socioeconomics -2.94 %, Psych/Spiritual -5.71 %, Family -5 %   and PHQ-9: 0  C.  Education/Instruction/Skills  Accurately checks own pulse.  Rest:  79  Exercise:  122, Knows THR for exercise and Attended 13 education classes  Uses Perceived Exertion Scale and/or Dyspnea Scale  D.  Nutrition/Weight Control/Body Composition:  Adherence to prescribed nutrition program: fair  and Patient has gained 3.8 kg   E.  Blood Lipids    Lab Results  Component Value Date   CHOL 166 12/18/2015   HDL 35* 12/18/2015   LDLCALC 90 12/18/2015   TRIG 205* 12/18/2015   CHOLHDL 4.7 12/18/2015    F.  Lifestyle Changes:  Making positive lifestyle changes.  Never Smoker  G.  Symptoms noted with exercise:  Asymptomatic  Report Completed By:  Stevphen Rochester RN   Comments:  This is the patients graduation note for [facility].  Patient progressed well in the program.  Evidence of body weight loss noted.  Patient is going to join the maintenance program in February.

## 2016-01-05 NOTE — Progress Notes (Signed)
Patient is discharged from [facility] program today, 01/05/16 with 36 sessions.  He achieved LTG of 30 minutes of aerobic exercise at max met level of 3.80.  All patient vitals mostly WNL with some hypotensive recovery pressures where patient was asymptomatic.  Patient has not met with dietician.  Discharge instructions have been reviewed in detail and patient expressed an understanding of material given.  Patient plans to exercise at home and possibly join the maintenance program. Cardiac Rehab will make 1 month, 6 month and 1 year call backs.  Patient had no complaints of any abnormal S/S or pain on their exit visit.  Patient finished post walk test.   

## 2016-01-07 ENCOUNTER — Telehealth: Payer: Self-pay

## 2016-01-07 ENCOUNTER — Encounter (HOSPITAL_COMMUNITY): Payer: Federal, State, Local not specified - PPO

## 2016-01-07 DIAGNOSIS — G4733 Obstructive sleep apnea (adult) (pediatric): Secondary | ICD-10-CM

## 2016-01-07 NOTE — Telephone Encounter (Signed)
Spoke to pt and advised him that his sleep study results were reviewed by Dr. Brett Fairy and once the CPAP was initiated, it was effective in treating pt's apnea. There were PLMS during his sleep study, and if they don't resolve, Dr. Brett Fairy and the pt may consider treating them. Pt verbalized understanding. Pt is agreeable to starting cpap. Advised pt to use it at least four or more hours per night. Will refer to [facility]. A follow up appt was made for 3/21 at 3:30. Pt verbalized understanding.

## 2016-01-09 DIAGNOSIS — Z736 Limitation of activities due to disability: Secondary | ICD-10-CM

## 2016-01-15 ENCOUNTER — Encounter: Payer: Self-pay | Admitting: Adult Health

## 2016-01-15 ENCOUNTER — Ambulatory Visit (INDEPENDENT_AMBULATORY_CARE_PROVIDER_SITE_OTHER): Payer: Federal, State, Local not specified - PPO | Admitting: Adult Health

## 2016-01-15 VITALS — BP 106/60 | HR 84 | Ht 70.0 in | Wt 263.0 lb

## 2016-01-15 DIAGNOSIS — Q341 Congenital cyst of mediastinum: Secondary | ICD-10-CM | POA: Insufficient documentation

## 2016-01-15 NOTE — Patient Instructions (Signed)
Your physician wants you to follow-up in: 6 Months with Dr. McDowell.  You will receive a reminder letter in the mail two months in advance. If you don't receive a letter, please call our office to schedule the follow-up appointment.  Your physician recommends that you continue on your current medications as directed. Please refer to the Current Medication list given to you today.  If you need a refill on your cardiac medications before your next appointment, please call your pharmacy.  Thank you for choosing [facility]! '  

## 2016-01-15 NOTE — Progress Notes (Signed)
Name: [name]    DOB: 1961/06/01  Age: 55 y.o.  MR#: [id number]       PCP:  Purvis Kilts, MD      Insurance: Payor: [place] / Plan: BCBS/FEDERAL EMP PPO / Product Type: *No Product type* /   CC:   No chief complaint on file.   VS Filed Vitals:   01/15/16 1512  BP: 106/60  Pulse: 84  Height: 5\' 10"  (1.778 m)  Weight: 263 lb (119.296 kg)  SpO2: 95%    Weights Current Weight  01/15/16 263 lb (119.296 kg)  12/18/15 256 lb (116.121 kg)  11/25/15 259 lb (117.482 kg)    Blood Pressure  BP Readings from Last 3 Encounters:  01/15/16 106/60  12/18/15 106/68  11/25/15 128/82     Admit date:  (Not on file) Last encounter with RMR:  09/18/2015   Allergy Review of patient's allergies indicates no known allergies.  Current Outpatient Prescriptions  Medication Sig Dispense Refill  . aspirin EC 325 MG EC tablet Take 1 tablet (325 mg total) by mouth daily. 30 tablet 0  . benzonatate (TESSALON) 100 MG capsule 100 mg 3 (three) times daily.    . chlorpheniramine-HYDROcodone (TUSSIONEX) 10-8 MG/5ML SUER Take 5 mLs by mouth every 6 (six) hours as needed for cough. 140 mL 0  . guaiFENesin (MUCINEX) 600 MG 12 hr tablet Take 2 tablets (1,200 mg total) by mouth 2 (two) times daily as needed.    . hydrochlorothiazide 25 MG tablet Take 12.5 mg by mouth daily.     . insulin lispro protamine-lispro (HUMALOG 75/25 MIX) (75-25) 100 UNIT/ML SUSP injection Inject 70 Units into the skin 2 (two) times daily with a meal.    . KOMBIGLYZE XR 2.04-999 MG TB24 Take 1 tablet by mouth 2 (two) times daily.     [name] loratadine (CLARITIN) 10 MG tablet Take 10 mg by mouth daily.    . metoprolol tartrate (LOPRESSOR) 25 MG tablet Take 0.5 tablets (12.5 mg total) by mouth 2 (two) times daily. 90 tablet 3  . Multiple Vitamin (MULTIVITAMIN WITH MINERALS) TABS tablet Take 1 tablet by mouth daily.    [name] olmesartan (BENICAR) 40 MG tablet Take 1 tablet (40 mg total) by mouth daily. 90 tablet 3  .  oxyCODONE (OXY IR/ROXICODONE) 5 MG immediate release tablet Take 1-2 tablets (5-10 mg total) by mouth every 3 (three) hours as needed for severe pain. 30 tablet 0  . pravastatin (PRAVACHOL) 20 MG tablet Take 1 tablet (20 mg total) by mouth at bedtime. 90 tablet 3  . ranitidine (ZANTAC) 150 MG tablet Take 150 mg by mouth at bedtime as needed for heartburn (as needed for heartburn.).     [name] traMADol (ULTRAM) 50 MG tablet Take 1-2 tablets (50-100 mg total) by mouth every 4 (four) hours as needed for moderate pain. 30 tablet 0   No current facility-administered medications for this visit.    Discontinued Meds:   There are no discontinued medications.  Patient Active Problem List   Diagnosis Date Noted  . S/P CABG x 4 08/29/2015  . Coronary artery disease involving native coronary artery with other forms of angina pectoris ([place])   . Morbid obesity ([place])   . Hyperglycemia   . Chronic systolic HF (heart failure) ([employer])   . Pain in the chest 08/23/2015  . Non-ST elevation MI (NSTEMI) ([place]) 08/23/2015  . Diabetes mellitus type 2, uncontrolled ([place]) 08/23/2015  . Hypertension 08/23/2015  . Hyperlipidemia 08/23/2015  .  Osteoarthritis, knee 05/24/2012  . Knee pain 10/29/2011  . Knee stiffness 10/29/2011  . S/P right knee arthroscopy 10/26/2011  . Medial meniscus, posterior horn derangement 09/07/2011  . Lateral meniscus derangement 09/07/2011  . OA (osteoarthritis) of knee 09/07/2011  . Rotator cuff syndrome of left shoulder 08/19/2011  . Right knee meniscal tear 08/19/2011    LABS    Component Value Date/Time   NA 135 09/03/2015 0318   NA 136 09/02/2015 0402   NA 136 09/01/2015 0414   K 3.8 09/03/2015 0318   K 3.8 09/02/2015 0402   K 3.5 09/01/2015 0414   CL 97* 09/03/2015 0318   CL 98* 09/02/2015 0402   CL 102 09/01/2015 0414   CO2 30 09/03/2015 0318   CO2 27 09/02/2015 0402   CO2 27 09/01/2015 0414   GLUCOSE 139* 09/03/2015 0318   GLUCOSE 143* 09/02/2015 0402   GLUCOSE 127*  09/01/2015 0414   BUN 21* 09/03/2015 0318   BUN 21* 09/02/2015 0402   BUN 19 09/01/2015 0414   CREATININE 1.18 09/03/2015 0318   CREATININE 1.21 09/02/2015 0402   CREATININE 1.21 09/01/2015 0414   CALCIUM 8.5* 09/03/2015 0318   CALCIUM 8.1* 09/02/2015 0402   CALCIUM 8.0* 09/01/2015 0414   GFRNONAA >60 09/03/2015 0318   GFRNONAA >60 09/02/2015 0402   GFRNONAA >60 09/01/2015 0414   GFRAA >60 09/03/2015 0318   GFRAA >60 09/02/2015 0402   GFRAA >60 09/01/2015 0414   CMP     Component Value Date/Time   NA 135 09/03/2015 0318   K 3.8 09/03/2015 0318   CL 97* 09/03/2015 0318   CO2 30 09/03/2015 0318   GLUCOSE 139* 09/03/2015 0318   BUN 21* 09/03/2015 0318   CREATININE 1.18 09/03/2015 0318   CALCIUM 8.5* 09/03/2015 0318   PROT 6.2* 08/23/2015 0545   ALBUMIN 3.4* 08/23/2015 0545   AST 71* 08/23/2015 0545   ALT 29 08/23/2015 0545   ALKPHOS 37* 08/23/2015 0545   BILITOT 0.7 08/23/2015 0545   GFRNONAA >60 09/03/2015 0318   GFRAA >60 09/03/2015 0318       Component Value Date/Time   WBC 6.7 09/03/2015 0318   WBC 8.0 09/02/2015 0402   WBC 8.9 09/01/2015 0414   HGB 10.6* 09/03/2015 0318   HGB 10.4* 09/02/2015 0402   HGB 9.6* 09/01/2015 0414   HCT 32.1* 09/03/2015 0318   HCT 31.8* 09/02/2015 0402   HCT 28.6* 09/01/2015 0414   MCV 93.6 09/03/2015 0318   MCV 94.9 09/02/2015 0402   MCV 94.7 09/01/2015 0414    Lipid Panel     Component Value Date/Time   CHOL 166 12/18/2015 0819   TRIG 205* 12/18/2015 0819   HDL 35* 12/18/2015 0819   CHOLHDL 4.7 12/18/2015 0819   VLDL 41* 12/18/2015 0819   LDLCALC 90 12/18/2015 0819    ABG    Component Value Date/Time   PHART 7.373 08/29/2015 1718   PCO2ART 37.4 08/29/2015 1718   PO2ART 90.0 08/29/2015 1718   HCO3 21.7 08/29/2015 1718   TCO2 22 08/30/2015 1710   ACIDBASEDEF 3.0* 08/29/2015 1718   O2SAT 97.0 08/29/2015 1718     No results found for: TSH BNP (last 3 results) No results for input(s): BNP in the last 8760  hours.  ProBNP (last 3 results) No results for input(s): PROBNP in the last 8760 hours.  Cardiac Panel (last 3 results) No results for input(s): CKTOTAL, CKMB, TROPONINI, RELINDX in the last 72 hours.  Iron/TIBC/Ferritin/ %Sat No results found for:  IRON, TIBC, FERRITIN, IRONPCTSAT   EKG Orders placed or performed during the hospital encounter of 08/22/15  . EKG  . EKG 12-Lead  . EKG 12-Lead  . EKG 12-Lead  . EKG 12-Lead     Prior Assessment and Plan Problem List as of 01/15/2016      Cardiovascular and Mediastinum   Non-ST elevation MI (NSTEMI) ([place])   Hypertension   Chronic systolic HF (heart failure) ([facility])   Coronary artery disease involving native coronary artery with other forms of angina pectoris ([place])     Endocrine   Diabetes mellitus type 2, uncontrolled ([place])     Musculoskeletal and Integument   Rotator cuff syndrome of left shoulder   Right knee meniscal tear   Medial meniscus, posterior horn derangement   Lateral meniscus derangement   OA (osteoarthritis) of knee   Osteoarthritis, knee     Other   S/P right knee arthroscopy   Knee pain   Knee stiffness   Pain in the chest   Hyperlipidemia   Hyperglycemia   Morbid obesity ([facility])   S/P CABG x 4       Imaging: No results found.

## 2016-01-15 NOTE — Progress Notes (Signed)
Cardiology Office Note   Date:  01/15/2016   ID:  Rondel, Keating 1961-05-28, MRN [id number]  PCP:  Purvis Kilts, MD  Cardiologist: [place]/  Jory Sims, NP   Chief Complaint  Patient presents with  . Cyst      History of Present Illness: [name] is a 55 y.o. male who presents for ongoing assessment and management of coronary artery disease, with history of coronary artery bypass grafting, (LIMA to LAD, SVG to ramus, SVG to PL and PDA.). He also has been diagnosed with obstructive sleep apnea, was placed on CPAP.  Diabetes, and hypertension. He had been participating in cardiac rehabilitation.  He was last seen by Dr. Domenic Polite on 12/18/2015.  No medication changes were made at that time, and was due to follow up in 6 months.  Today because he has seen a small cyst begin to grow at the base of his sternotomy scar.  This is concerning him as he had had something similar to this happened with a small cyst on the back of his right leg, which.so large that it became the size of the cyst, became painful, and had to have it removed surgically.  This concerns him as it is at its sternotomy site.  It is not painful, but it has been growing over the last week.  Past Medical History  Diagnosis Date  . Essential hypertension   . Type 2 diabetes mellitus ([place])   . Hyperlipidemia   . Arthritis   . CAD (coronary artery disease)     Multivessel disease status post CABG 08/2015    Past Surgical History  Procedure Laterality Date  . Appendectomy    . Biceps tendon surgery Right   . Knee arthroscopy Left   . Cardiac catheterization N/A 08/25/2015    Procedure: Left Heart Cath and Coronary Angiography;  Surgeon: Belva Crome, MD;  Location: [facility];  Service: Cardiovascular;  Laterality: N/A;  . Coronary artery bypass graft N/A 08/29/2015    Procedure: CORONARY ARTERY BYPASS GRAFTING (CABG);  Surgeon: [name], MD;  Location: [facility];  Service:  Open Heart Surgery;  Laterality: N/A;  . Tee without cardioversion N/A 08/29/2015    Procedure: TRANSESOPHAGEAL ECHOCARDIOGRAM (TEE);  Surgeon: [name], MD;  Location: [facility];  Service: Open Heart Surgery;  Laterality: N/A;     Current Outpatient Prescriptions  Medication Sig Dispense Refill  . aspirin EC 325 MG EC tablet Take 1 tablet (325 mg total) by mouth daily. 30 tablet 0  . benzonatate (TESSALON) 100 MG capsule 100 mg 3 (three) times daily.    . chlorpheniramine-HYDROcodone (TUSSIONEX) 10-8 MG/5ML SUER Take 5 mLs by mouth every 6 (six) hours as needed for cough. 140 mL 0  . guaiFENesin (MUCINEX) 600 MG 12 hr tablet Take 2 tablets (1,200 mg total) by mouth 2 (two) times daily as needed.    . hydrochlorothiazide 25 MG tablet Take 12.5 mg by mouth daily.     . insulin lispro protamine-lispro (HUMALOG 75/25 MIX) (75-25) 100 UNIT/ML SUSP injection Inject 70 Units into the skin 2 (two) times daily with a meal.    . KOMBIGLYZE XR 2.04-999 MG TB24 Take 1 tablet by mouth 2 (two) times daily.     [name] loratadine (CLARITIN) 10 MG tablet Take 10 mg by mouth daily.    . metoprolol tartrate (LOPRESSOR) 25 MG tablet Take 0.5 tablets (12.5 mg total) by mouth 2 (two) times daily. 90 tablet 3  .  Multiple Vitamin (MULTIVITAMIN WITH MINERALS) TABS tablet Take 1 tablet by mouth daily.    [name] olmesartan (BENICAR) 40 MG tablet Take 1 tablet (40 mg total) by mouth daily. 90 tablet 3  . oxyCODONE (OXY IR/ROXICODONE) 5 MG immediate release tablet Take 1-2 tablets (5-10 mg total) by mouth every 3 (three) hours as needed for severe pain. 30 tablet 0  . pravastatin (PRAVACHOL) 20 MG tablet Take 1 tablet (20 mg total) by mouth at bedtime. 90 tablet 3  . ranitidine (ZANTAC) 150 MG tablet Take 150 mg by mouth at bedtime as needed for heartburn (as needed for heartburn.).     [name] traMADol (ULTRAM) 50 MG tablet Take 1-2 tablets (50-100 mg total) by mouth every 4 (four) hours as needed for moderate pain. 30 tablet 0    No current facility-administered medications for this visit.    Allergies:   Review of patient's allergies indicates no known allergies.    Social History:  The patient  reports that he has never smoked. He does not have any smokeless tobacco history on file. He reports that he does not drink alcohol or use illicit drugs.   Family History:  The patient's family history includes CAD in his brother and father; Diabetes Mellitus II in his brother and father. There is no history of Anesthesia problems, Hypotension, Malignant hyperthermia, or Pseudochol deficiency.    ROS: All other systems are reviewed and negative. Unless otherwise mentioned in H&P    PHYSICAL EXAM: VS:  BP 106/60 mmHg  Pulse 84  Ht 5\' 10"  (1.778 m)  Wt 263 lb (119.296 kg)  BMI 37.74 kg/m2  SpO2 95% , BMI Body mass index is 37.74 kg/(m^2). GEN: Well nourished, well developed, in no acute distress HEENT: normal Neck: no JVD, carotid bruits, or masses Cardiac: RRR; no murmurs, rubs, or gallops,no edema  Respiratory:  clear to auscultation bilaterally, normal work of breathing GI: soft, nontender, nondistended, + BS MS: no deformity or atrophy Skin: warm and dry, no rash Neuro:  Strength and sensation are intact Psych: euthymic mood, full affect  Recent Labs: 08/23/2015: ALT 29 08/30/2015: Magnesium 2.3 09/03/2015: BUN 21*; Creatinine, Ser 1.18; Hemoglobin 10.6*; Platelets 205; Potassium 3.8; Sodium 135    Lipid Panel    Component Value Date/Time   CHOL 166 12/18/2015 0819   TRIG 205* 12/18/2015 0819   HDL 35* 12/18/2015 0819   CHOLHDL 4.7 12/18/2015 0819   VLDL 41* 12/18/2015 0819   LDLCALC 90 12/18/2015 0819      Wt Readings from Last 3 Encounters:  01/15/16 263 lb (119.296 kg)  12/18/15 256 lb (116.121 kg)  11/25/15 259 lb (117.482 kg)      ASSESSMENT AND PLAN:  1.  Skin vesicle: small size, round, 1 mm clear skin vesicle, soft.  It is not causing any erythema, it is not painful.  We have  asked him to watch this, if it begins to grow about the size of a thumbnail, he is to see his primary care physician, and may be seen by a dermatologist or surgeon to lance this area.  Otherwise, no further cardiology recommendations in this situation.  2.Hypotension:blood pressure is normally low, he is asymptomatic with this and does not wish to adjust his medications as he feels good.  On this medication regimen, and has no dizziness, lightheadedness, or feelings of near syncope.   Current medicines are reviewed at length with the patient today.    Labs/ tests ordered today include: No orders of the  defined types were placed in this encounter.     Disposition:   FU with 6 months  Signed, Jory Sims, NP  01/15/2016 4:54 PM    [facility] [address] [place], [place] [address] Phone: [phone]; Fax: [phone]

## 2016-01-27 ENCOUNTER — Other Ambulatory Visit: Payer: Self-pay | Admitting: Orthopedic Surgery

## 2016-01-28 ENCOUNTER — Ambulatory Visit (INDEPENDENT_AMBULATORY_CARE_PROVIDER_SITE_OTHER): Payer: Federal, State, Local not specified - PPO | Admitting: *Deleted

## 2016-01-28 DIAGNOSIS — T814XXA Infection following a procedure, initial encounter: Secondary | ICD-10-CM

## 2016-01-28 DIAGNOSIS — Z951 Presence of aortocoronary bypass graft: Secondary | ICD-10-CM

## 2016-01-28 DIAGNOSIS — IMO0001 Reserved for inherently not codable concepts without codable children: Secondary | ICD-10-CM

## 2016-01-28 NOTE — Progress Notes (Unsigned)
[name] was asked to come to the office after he called and said he had an area at the proximal end of his sternal incision that kept draining off and on and it was pus.  He had seen his cardiologist and PCP and was advised to see his surgeon.  He is s/p CABG 08/29/15.  He is otherwise doing well. On exam he has a small area that does express a very small amount of yellow drainage. I cleansed the area with an alcohol wipe and rubbed it with the end of a Q-tip.  A very small hole was evident. I explored it with the end of a forcep and extracted pieces of retained suture. I cleansed the area with peroxide followed by normal saline.  The area was very clean and depth was only 1/2 the head of a Q-tip.  I applied Bacitracin.  He will observe the site and call if the problem persists.

## 2016-02-02 ENCOUNTER — Other Ambulatory Visit: Payer: Self-pay | Admitting: *Deleted

## 2016-02-02 DIAGNOSIS — M25561 Pain in right knee: Secondary | ICD-10-CM

## 2016-02-02 DIAGNOSIS — M25562 Pain in left knee: Principal | ICD-10-CM

## 2016-02-02 MED ORDER — TRAMADOL-ACETAMINOPHEN 37.5-325 MG PO TABS: 1.0000 | ORAL_TABLET | ORAL | Status: AC | PRN

## 2016-02-12 DIAGNOSIS — G4733 Obstructive sleep apnea (adult) (pediatric): Secondary | ICD-10-CM | POA: Diagnosis not present

## 2016-03-02 ENCOUNTER — Ambulatory Visit (INDEPENDENT_AMBULATORY_CARE_PROVIDER_SITE_OTHER): Payer: Federal, State, Local not specified - PPO | Admitting: Neurology

## 2016-03-02 ENCOUNTER — Encounter: Payer: Self-pay | Admitting: Neurology

## 2016-03-02 VITALS — BP 122/82 | HR 88 | Resp 20 | Ht 70.0 in | Wt 264.0 lb

## 2016-03-02 DIAGNOSIS — G4734 Idiopathic sleep related nonobstructive alveolar hypoventilation: Secondary | ICD-10-CM | POA: Diagnosis not present

## 2016-03-02 DIAGNOSIS — G4733 Obstructive sleep apnea (adult) (pediatric): Secondary | ICD-10-CM | POA: Diagnosis not present

## 2016-03-02 DIAGNOSIS — Z9989 Dependence on other enabling machines and devices: Secondary | ICD-10-CM

## 2016-03-02 DIAGNOSIS — R0902 Hypoxemia: Secondary | ICD-10-CM | POA: Insufficient documentation

## 2016-03-02 NOTE — Patient Instructions (Signed)

## 2016-03-02 NOTE — Progress Notes (Signed)
SLEEP MEDICINE CLINIC   Provider:  Larey Seat, M D  Referring Provider: [name], MD Primary Care Physician:  Purvis Kilts, MD  Chief Complaint  Patient presents with  . Follow-up    new cpap user, going well, rm 10, alone    HPI:  [name] is a 55 y.o. male , seen here as a referral from Dr. Hilma Favors for a sleep evaluation.      Mr. Matsuyama states that he is here because his physician and his spouse are concerned about his sleep apnea. About 7 weeks ago he had presented with chest tightness and was told that he had significant coronary artery disease. Some of his vessels were 95% occluded. He underwent bypass surgery on 08-29-15, and it was in the hospital that he was observed having apnea by his nurses as well as by his spouse. I would like to allude that the patient has other risk factors such as diabetes, hypertension, hypercholesterolemia and that he used to be morbidly obese but lost about 80 pounds within the last 12 months. The patient acknowledges that he feels fatigued and daytime sleepy also this varies day by day. Often his sleep is not refreshing nonrestorative. His wife has noticed him to snore as well as witnessed apneas. Sleep habits are as follows: The patient retreats usually between 9 and 10 at night to the bedroom, he is promptly asleep. The bedroom is described as core, quiet and dark. He shares the bedroom in bed with his wife. His wife has not mentioned any restlessness kicking or thrashing. He prefers to sleep on his side and states that he cannot sleep on his back. He usually wakes up every 2 hours or so goes to the bathroom. 2-3 times at night. He goes promptly to sleep again. He rises in the morning at 6 AM and wakes spontaneously without alarm. He has not noticed a dry mouth in the morning, he may sometimes have a morning headache. He reports no numbness, no jaw pain. He has never been woken up by headaches from sleep. He reports that he  dreams also his dreams are not acted out upon, nor are extremely vivid or Water quality scientist. When he wakes up he is often on his back and not on his side. Sleep medical history and family sleep history: Mr. Overdorf father suffered from coronary artery disease and died of multiorgan failure. Mr. Hapner younger brother also has undergone bypass surgery for coronary artery disease. His brother also has been diagnosed with obstructive sleep apnea. Social history: married, disabled due to heart disease. He does not use tobacco products and does not drink alcohol, he drinks mainly water and has 1 or 2 coffees in the morning. No soda or iced tea.   Interval history from 03/02/2016  We are  meeting today to follow-up on a recent sleep study;  the patient underwent polysomnography on 11/30/2015 and was diagnosed with mild to moderate apnea at an AHI of 16.5, REM AHI of 30.7, supine AHI of 90.6. He also had the oxygen nadir as low as 79% saturation was 30 minutes of desaturation time.  For this reason I suggested strongly to use CPAP rather than a dental device. The CPAP titration followed on 12/30/2015 and reduced the AHI to 1.3 the patient did best at 9 cm water pressure was 17 m EPR I recommended an air-fit P 10 in medium size with heated humidity.  The patient's compliance downloads today shows 100% compliance for  the last 30 days 97% compliance for use over 4 hours average user time is 7 hours 31 minutes and the patient has an AHI of 0.6 at 9 cm water pressure. This is an almost complete alleviation there is no significant air leak and he has been using the same unit humidity settings for the last month. He endorsed today the Epworth sleepiness score at 4 points and the fatigue severity score at 17 points.    Review of Systems: Out of a complete 14 system review, the patient complains of only the following symptoms, and all other reviewed systems are negative. Coughing, joint pain, birth  marks, wife has reported snoring and apnea to be witnessed. Daytime fatigue and sleepiness.  Epworth score  4 from 11 , Fatigue severity score 17 from 42  .   Social History   Social History  . Marital Status: Married    Spouse Name: N/A  . Number of Children: N/A  . Years of Education: college   Occupational History  . Maintenance tech    Social History Main Topics  . Smoking status: Never Smoker   . Smokeless tobacco: Not on file  . Alcohol Use: No  . Drug Use: No  . Sexual Activity: Not on file   Other Topics Concern  . Not on file   Social History Narrative    Family History  Problem Relation Age of Onset  . Arthritis    . Cancer    . Diabetes    . Anesthesia problems Neg Hx   . Hypotension Neg Hx   . Malignant hyperthermia Neg Hx   . Pseudochol deficiency Neg Hx   . CAD Father   . Diabetes Mellitus II Father   . CAD Brother   . Diabetes Mellitus II Brother     Past Medical History  Diagnosis Date  . Essential hypertension   . Type 2 diabetes mellitus ([place])   . Hyperlipidemia   . Arthritis   . CAD (coronary artery disease)     Multivessel disease status post CABG 08/2015    Past Surgical History  Procedure Laterality Date  . Appendectomy    . Biceps tendon surgery Right   . Knee arthroscopy Left   . Cardiac catheterization N/A 08/25/2015    Procedure: Left Heart Cath and Coronary Angiography;  Surgeon: Belva Crome, MD;  Location: [facility];  Service: Cardiovascular;  Laterality: N/A;  . Coronary artery bypass graft N/A 08/29/2015    Procedure: CORONARY ARTERY BYPASS GRAFTING (CABG);  Surgeon: [name], MD;  Location: [facility];  Service: Open Heart Surgery;  Laterality: N/A;  . Tee without cardioversion N/A 08/29/2015    Procedure: TRANSESOPHAGEAL ECHOCARDIOGRAM (TEE);  Surgeon: [name], MD;  Location: [facility];  Service: Open Heart Surgery;  Laterality: N/A;    Current Outpatient Prescriptions  Medication Sig Dispense  Refill  . aspirin EC 325 MG EC tablet Take 1 tablet (325 mg total) by mouth daily. 30 tablet 0  . chlorpheniramine-HYDROcodone (TUSSIONEX) 10-8 MG/5ML SUER Take 5 mLs by mouth every 6 (six) hours as needed for cough. 140 mL 0  . guaiFENesin (MUCINEX) 600 MG 12 hr tablet Take 2 tablets (1,200 mg total) by mouth 2 (two) times daily as needed.    . hydrochlorothiazide 25 MG tablet Take 12.5 mg by mouth daily.     . insulin lispro protamine-lispro (HUMALOG 75/25 MIX) (75-25) 100 UNIT/ML SUSP injection Inject 70 Units into the skin 2 (two) times  daily with a meal.    . KOMBIGLYZE XR 2.04-999 MG TB24 Take 1 tablet by mouth 2 (two) times daily.     [name] loratadine (CLARITIN) 10 MG tablet Take 10 mg by mouth daily.    . metoprolol tartrate (LOPRESSOR) 25 MG tablet Take 0.5 tablets (12.5 mg total) by mouth 2 (two) times daily. 90 tablet 3  . Multiple Vitamin (MULTIVITAMIN WITH MINERALS) TABS tablet Take 1 tablet by mouth daily.    [name] olmesartan (BENICAR) 40 MG tablet Take 1 tablet (40 mg total) by mouth daily. 90 tablet 3  . oxyCODONE (OXY IR/ROXICODONE) 5 MG immediate release tablet Take 1-2 tablets (5-10 mg total) by mouth every 3 (three) hours as needed for severe pain. 30 tablet 0  . pravastatin (PRAVACHOL) 20 MG tablet Take 1 tablet (20 mg total) by mouth at bedtime. 90 tablet 3  . ranitidine (ZANTAC) 150 MG tablet Take 150 mg by mouth at bedtime as needed for heartburn (as needed for heartburn.).     [name] traMADol (ULTRAM) 50 MG tablet Take 1-2 tablets (50-100 mg total) by mouth every 4 (four) hours as needed for moderate pain. 30 tablet 0  . traMADol-acetaminophen (ULTRACET) 37.5-325 MG tablet Take 1 tablet by mouth every 4 (four) hours as needed. 90 tablet 5   No current facility-administered medications for this visit.    Allergies as of 03/02/2016  . (No Known Allergies)    Vitals: BP 122/82 mmHg  Pulse 88  Resp 20  Ht 5\' 10"  (1.778 m)  Wt 264 lb (119.75 kg)  BMI 37.88 kg/m2 Last Weight:    Wt Readings from Last 1 Encounters:  03/02/16 264 lb (119.75 kg)   [id number] mass index is 37.88 kg/(m^2).     Last Height:   Ht Readings from Last 1 Encounters:  03/02/16 5\' 10"  (1.778 m)    Physical exam:  General: The patient is awake, alert and appears not in acute distress. The patient is well groomed. Head: Normocephalic, atraumatic. Neck is supple. Mallampati1 ,  neck circumference:18.25 . Nasal airflow intact , TMJ is not  evident . Retrognathia is not seen. Crowded dental status/ Cardiovascular:  Regular rate and rhythm , without  murmurs or carotid bruit, and without distended neck veins. Respiratory: Lungs are clear to auscultation. Skin:  Without evidence of edema, or rash Trunk: BMI is  The patient's posture is erect.   Neurologic exam : The patient is awake and alert, oriented to place and time.   Memory subjective described as intact.  Attention span & concentration ability appears normal.  Speech is fluent,  without  dysarthria, dysphonia or aphasia.  Mood and affect are appropriate.  Cranial nerves: Pupils are equal and briskly reactive to light. Hearing to finger rub intact.   Facial sensation intact to fine touch.  Facial motor strength is symmetric and tongue and uvula move midline. Shoulder shrug was symmetrical.   Motor exam:  Normal tone, muscle bulk and symmetric strength in all extremities.   The patient was advised of the nature of the diagnosed sleep disorder , the treatment options and risks for general a health and wellness arising from not treating the condition.  I spent more than 20 minutes of face to face time with the patient. Greater than 50% of time was spent in counseling and coordination of care. We have discussed the diagnosis and differential and I answered the patient's questions.     Assessment:  After physical and neurologic examination, review of laboratory  studies.   Personal review of imaging studies, reports of other /same  Imaging  studies, results of polysomnography/ neurophysiology testing and pre-existing records as far as provided in visit. I reviewed hospital notes, my assessment is:    1) Mr. Laramore does have OSA. 2) the patient also had hypoxemia related to the apnea during his PSG, this made the case for using CPAP in the treatment.  3) nocturia, much reduced on CPAP.  Plan:  Treatment plan and additional workup :  I like for Mr. Rzepecki to continue using his CPAP at the current setting he is using also a dream where mask and a dream station CPAP machine there is no reason to change to auto CPAP. A reduction and sleepiness and fatigue is also reflected in his self-assessment scores. From now on I will see Mr. Winborn once a year in follow-up.  Asencion Partridge Armani Brar MD  03/02/2016   CC: [name], [place], [place] [address]

## 2016-03-07 ENCOUNTER — Encounter: Payer: Self-pay | Admitting: *Deleted

## 2016-03-14 DIAGNOSIS — G4733 Obstructive sleep apnea (adult) (pediatric): Secondary | ICD-10-CM | POA: Diagnosis not present

## 2016-03-29 DIAGNOSIS — J343 Hypertrophy of nasal turbinates: Secondary | ICD-10-CM | POA: Diagnosis not present

## 2016-03-29 DIAGNOSIS — Z1389 Encounter for screening for other disorder: Secondary | ICD-10-CM | POA: Diagnosis not present

## 2016-03-29 DIAGNOSIS — J069 Acute upper respiratory infection, unspecified: Secondary | ICD-10-CM | POA: Diagnosis not present

## 2016-03-29 DIAGNOSIS — Z6839 Body mass index (BMI) 39.0-39.9, adult: Secondary | ICD-10-CM | POA: Diagnosis not present

## 2016-03-29 DIAGNOSIS — J40 Bronchitis, not specified as acute or chronic: Secondary | ICD-10-CM | POA: Diagnosis not present

## 2016-04-13 DIAGNOSIS — G4733 Obstructive sleep apnea (adult) (pediatric): Secondary | ICD-10-CM | POA: Diagnosis not present

## 2016-04-29 DIAGNOSIS — I10 Essential (primary) hypertension: Secondary | ICD-10-CM | POA: Diagnosis not present

## 2016-04-29 DIAGNOSIS — N189 Chronic kidney disease, unspecified: Secondary | ICD-10-CM | POA: Diagnosis not present

## 2016-04-29 DIAGNOSIS — E7800 Pure hypercholesterolemia, unspecified: Secondary | ICD-10-CM | POA: Diagnosis not present

## 2016-04-29 DIAGNOSIS — E1165 Type 2 diabetes mellitus with hyperglycemia: Secondary | ICD-10-CM | POA: Diagnosis not present

## 2016-05-03 DIAGNOSIS — Z736 Limitation of activities due to disability: Secondary | ICD-10-CM

## 2016-05-15 ENCOUNTER — Emergency Department (HOSPITAL_COMMUNITY)
Admission: EM | Admit: 2016-05-15 | Discharge: 2016-05-16 | Disposition: A | Discharge status: Home / Self Care | Payer: Federal, State, Local not specified - PPO | Attending: Emergency Medicine | Admitting: Emergency Medicine

## 2016-05-15 ENCOUNTER — Encounter (HOSPITAL_COMMUNITY): Payer: Self-pay | Admitting: *Deleted

## 2016-05-15 DIAGNOSIS — E119 Type 2 diabetes mellitus without complications: Secondary | ICD-10-CM | POA: Diagnosis not present

## 2016-05-15 DIAGNOSIS — Z79899 Other long term (current) drug therapy: Secondary | ICD-10-CM | POA: Diagnosis not present

## 2016-05-15 DIAGNOSIS — I2510 Atherosclerotic heart disease of native coronary artery without angina pectoris: Secondary | ICD-10-CM | POA: Diagnosis not present

## 2016-05-15 DIAGNOSIS — I10 Essential (primary) hypertension: Secondary | ICD-10-CM | POA: Diagnosis not present

## 2016-05-15 DIAGNOSIS — E785 Hyperlipidemia, unspecified: Secondary | ICD-10-CM | POA: Insufficient documentation

## 2016-05-15 DIAGNOSIS — Z794 Long term (current) use of insulin: Secondary | ICD-10-CM | POA: Diagnosis not present

## 2016-05-15 DIAGNOSIS — Z7982 Long term (current) use of aspirin: Secondary | ICD-10-CM | POA: Diagnosis not present

## 2016-05-15 DIAGNOSIS — R1011 Right upper quadrant pain: Secondary | ICD-10-CM

## 2016-05-15 DIAGNOSIS — M1990 Unspecified osteoarthritis, unspecified site: Secondary | ICD-10-CM | POA: Insufficient documentation

## 2016-05-15 DIAGNOSIS — R1013 Epigastric pain: Secondary | ICD-10-CM | POA: Diagnosis not present

## 2016-05-15 LAB — COMPREHENSIVE METABOLIC PANEL
ALT: 27 U/L (ref 17–63)
AST: 43 U/L — ABNORMAL HIGH (ref 15–41)
Albumin: 4.1 g/dL (ref 3.5–5.0)
Alkaline Phosphatase: 48 U/L (ref 38–126)
Anion gap: 9 (ref 5–15)
BUN: 22 mg/dL — ABNORMAL HIGH (ref 6–20)
CO2: 24 mmol/L (ref 22–32)
Calcium: 9 mg/dL (ref 8.9–10.3)
Chloride: 101 mmol/L (ref 101–111)
Creatinine, Ser: 1.31 mg/dL — ABNORMAL HIGH (ref 0.61–1.24)
GFR calc Af Amer: >60 mL/min (ref >60)
GFR calc non Af Amer: 59 mL/min — ABNORMAL LOW (ref >60)
Glucose, Bld: 238 mg/dL — ABNORMAL HIGH (ref 65–99)
Potassium: 3.9 mmol/L (ref 3.5–5.1)
Sodium: 134 mmol/L — ABNORMAL LOW (ref 135–145)
Total Bilirubin: 0.5 mg/dL (ref 0.3–1.2)
Total Protein: 7.4 g/dL (ref 6.5–8.1)

## 2016-05-15 LAB — CBC WITH DIFFERENTIAL/PLATELET
Basophils Absolute: 0 10*3/uL (ref 0.0–0.1)
Basophils Relative: 0 %
Eosinophils Absolute: 0.1 10*3/uL (ref 0.0–0.7)
Eosinophils Relative: 2 %
HCT: 39.7 % (ref 39.0–52.0)
Hemoglobin: 13.7 g/dL (ref 13.0–17.0)
Lymphocytes Relative: 30 %
Lymphs Abs: 2 10*3/uL (ref 0.7–4.0)
MCH: 32 pg (ref 26.0–34.0)
MCHC: 34.5 g/dL (ref 30.0–36.0)
MCV: 92.8 fL (ref 78.0–100.0)
Monocytes Absolute: 0.6 10*3/uL (ref 0.1–1.0)
Monocytes Relative: 9 %
Neutro Abs: 4.1 10*3/uL (ref 1.7–7.7)
Neutrophils Relative %: 59 %
Platelets: 174 10*3/uL (ref 150–400)
RBC: 4.28 MIL/uL (ref 4.22–5.81)
RDW: 13.7 % (ref 11.5–15.5)
WBC: 6.9 10*3/uL (ref 4.0–10.5)

## 2016-05-15 LAB — TROPONIN I: Troponin I: <0.03 ng/mL (ref <0.031)

## 2016-05-15 LAB — LIPASE, BLOOD: Lipase: 25 U/L (ref 11–51)

## 2016-05-15 MED ORDER — ONDANSETRON HCL 4 MG/2ML IJ SOLN
4.0000 mg | Freq: Once | INTRAMUSCULAR | Status: AC
  Administered 2016-05-15: 4 mg via INTRAVENOUS
  Filled 2016-05-15: qty 2

## 2016-05-15 MED ORDER — MORPHINE SULFATE (PF) 4 MG/ML IV SOLN
4.0000 mg | Freq: Once | INTRAVENOUS | Status: AC
  Administered 2016-05-15: 4 mg via INTRAVENOUS
  Filled 2016-05-15: qty 1

## 2016-05-15 NOTE — ED Provider Notes (Signed)
CSN: [id number]     Arrival date & time 05/15/16  2219 History  By signing my name below, I, [name], attest that this documentation has been prepared under the direction and in the presence of [name], MD. Electronically Signed: [name], ED Scribe. 05/16/2016. 12:57 AM.   Chief Complaint  Patient presents with  . Abdominal Pain    The history is provided by the patient. No language interpreter was used.  HPI Comments: [name] is a 55 y.o. male with a hx of HTN, HLN, DM, CAD, appendectomy, and CABG who presents to the Emergency Department complaining of constant, moderate, nonradiating epigastric and RUQ abdominal pain onset earlier this evening. Pt states that he was sitting at rest when pain began. Pain is worse with movement. Per pt, he had a CABG last year that presented only with chest pressure in his chest and notes that current symptoms are completely different. He takes aspirin regularly. Denies similar symptoms in the past Denies hx of abdominal conditions, recent surgeries, coronary stents, MIs, GERD, and gastric ulcers. Denies diarrhea, emesis, nausea, CP, dysuria, testicular pain, back pain, cough, SOB, or any other symptoms currently. NKDA.  Past Medical History  Diagnosis Date  . Essential hypertension   . Type 2 diabetes mellitus ([place])   . Hyperlipidemia   . Arthritis   . CAD (coronary artery disease)     Multivessel disease status post CABG 08/2015   Past Surgical History  Procedure Laterality Date  . Appendectomy    . Biceps tendon surgery Right   . Knee arthroscopy Left   . Cardiac catheterization N/A 08/25/2015    Procedure: Left Heart Cath and Coronary Angiography;  Surgeon: Belva Crome, MD;  Location: [facility];  Service: Cardiovascular;  Laterality: N/A;  . Coronary artery bypass graft N/A 08/29/2015    Procedure: CORONARY ARTERY BYPASS GRAFTING (CABG);  Surgeon: [name], MD;  Location: [facility];  Service:  Open Heart Surgery;  Laterality: N/A;  . Tee without cardioversion N/A 08/29/2015    Procedure: TRANSESOPHAGEAL ECHOCARDIOGRAM (TEE);  Surgeon: [name], MD;  Location: [facility];  Service: Open Heart Surgery;  Laterality: N/A;   Family History  Problem Relation Age of Onset  . Arthritis    . Cancer    . Diabetes    . Anesthesia problems Neg Hx   . Hypotension Neg Hx   . Malignant hyperthermia Neg Hx   . Pseudochol deficiency Neg Hx   . CAD Father   . Diabetes Mellitus II Father   . CAD Brother   . Diabetes Mellitus II Brother    Social History  Substance Use Topics  . Smoking status: Never Smoker   . Smokeless tobacco: None  . Alcohol Use: No    Review of Systems A complete 10 system review of systems was obtained and all systems are negative except as noted in the HPI and PMH.    Allergies  Review of patient's allergies indicates no known allergies.  Home Medications   Prior to Admission medications   Medication Sig Start Date End Date Taking? Authorizing Provider  aspirin EC 325 MG EC tablet Take 1 tablet (325 mg total) by mouth daily. 09/04/15  Yes Erin R Barrett, PA-C  fluticasone (FLONASE) 50 MCG/ACT nasal spray Place 2 sprays into both nostrils daily.   Yes Historical Provider, MD  guaiFENesin (MUCINEX) 600 MG 12 hr tablet Take 2 tablets (1,200 mg total) by mouth 2 (two) times daily  as needed. 09/04/15  Yes Erin R Barrett, PA-C  hydrochlorothiazide 25 MG tablet Take 12.5 mg by mouth daily.  06/19/11  Yes Historical Provider, MD  insulin lispro protamine-lispro (HUMALOG 75/25 MIX) (75-25) 100 UNIT/ML SUSP injection Inject 70 Units into the skin 2 (two) times daily with a meal.   Yes Historical Provider, MD  KOMBIGLYZE XR 2.04-999 MG TB24 Take 1 tablet by mouth 2 (two) times daily.  07/28/11  Yes Historical Provider, MD  loratadine (CLARITIN) 10 MG tablet Take 10 mg by mouth daily.   Yes Historical Provider, MD  metoprolol tartrate (LOPRESSOR) 25 MG tablet Take 0.5  tablets (12.5 mg total) by mouth 2 (two) times daily. 09/18/15  Yes Lendon Colonel, NP  Multiple Vitamin (MULTIVITAMIN WITH MINERALS) TABS tablet Take 1 tablet by mouth daily.   Yes Historical Provider, MD  olmesartan (BENICAR) 40 MG tablet Take 1 tablet (40 mg total) by mouth daily. 09/18/15  Yes Lendon Colonel, NP  pravastatin (PRAVACHOL) 20 MG tablet Take 1 tablet (20 mg total) by mouth at bedtime. 09/18/15  Yes Lendon Colonel, NP  ranitidine (ZANTAC) 150 MG tablet Take 150 mg by mouth at bedtime as needed for heartburn (as needed for heartburn.).  09/13/11  Yes Historical Provider, MD  traMADol-acetaminophen (ULTRACET) 37.5-325 MG tablet Take 1 tablet by mouth every 4 (four) hours as needed. 02/02/16 10/19/16 Yes Carole Civil, MD  chlorpheniramine-HYDROcodone (TUSSIONEX) 10-8 MG/5ML SUER Take 5 mLs by mouth every 6 (six) hours as needed for cough. 09/04/15   Erin R Barrett, PA-C  oxyCODONE (OXY IR/ROXICODONE) 5 MG immediate release tablet Take 1-2 tablets (5-10 mg total) by mouth every 3 (three) hours as needed for severe pain. 09/04/15   Erin R Barrett, PA-C  traMADol (ULTRAM) 50 MG tablet Take 1-2 tablets (50-100 mg total) by mouth every 4 (four) hours as needed for moderate pain. 09/04/15   Erin R Barrett, PA-C   BP 105/79 mmHg  Pulse 86  Temp(Src) 97.8 F (36.6 C) (Oral)  Resp 20  Ht 5\' 10"  (1.778 m)  Wt 260 lb (117.935 kg)  BMI 37.31 kg/m2  SpO2 95% Physical Exam  Constitutional: He is oriented to person, place, and time. He appears well-developed and well-nourished. No distress.  HENT:  Head: Normocephalic and atraumatic.  Mouth/Throat: Oropharynx is clear and moist. No oropharyngeal exudate.  Eyes: Conjunctivae and EOM are normal. Pupils are equal, round, and reactive to light.  Neck: Normal range of motion. Neck supple.  No meningismus.  Cardiovascular: Normal rate, regular rhythm, normal heart sounds and intact distal pulses.   No murmur heard. Pulmonary/Chest:  Effort normal and breath sounds normal. No respiratory distress.  Abdominal: Soft. There is tenderness in the right upper quadrant and epigastric area. There is guarding. There is no rebound and no CVA tenderness.  Obese abdomen. Tender RUQ and epigastrium with voluntary guarding. Well healed stenostomy incision. No CVA tenderness.  Musculoskeletal: Normal range of motion. He exhibits no edema or tenderness.  Neurological: He is alert and oriented to person, place, and time. No cranial nerve deficit. He exhibits normal muscle tone. Coordination normal.  No ataxia on finger to nose bilaterally. No pronator drift. 5/5 strength throughout. CN 2-12 intact.Equal grip strength. Sensation intact.   Skin: Skin is warm.  Psychiatric: He has a normal mood and affect. His behavior is normal.  Nursing note and vitals reviewed.   ED Course  Procedures   DIAGNOSTIC STUDIES: Oxygen Saturation is 96% on RA, adequate by  my interpretation.    COORDINATION OF CARE: 11:12 PM Will order labs, EKG, morphine, Zofran. Discussed treatment plan with pt at bedside and pt agreed to plan.   Labs Review Labs Reviewed  COMPREHENSIVE METABOLIC PANEL - Abnormal; Notable for the following:    Sodium 134 (*)    Glucose, Bld 238 (*)    BUN 22 (*)    Creatinine, Ser 1.31 (*)    AST 43 (*)    GFR calc non Af Amer 59 (*)    All other components within normal limits  URINALYSIS, ROUTINE W REFLEX MICROSCOPIC (NOT AT [facility]) - Abnormal; Notable for the following:    Glucose, UA 250 (*)    All other components within normal limits  CBC WITH DIFFERENTIAL/PLATELET  LIPASE, BLOOD  TROPONIN I  TROPONIN I    Imaging Review Ct Abdomen Pelvis W Contrast  05/16/2016  CLINICAL DATA:  Acute onset of epigastric and right upper quadrant abdominal pain. Initial encounter. EXAM: CT ABDOMEN AND PELVIS WITH CONTRAST TECHNIQUE: Multidetector CT imaging of the abdomen and pelvis was performed using the standard protocol following bolus  administration of intravenous contrast. CONTRAST:  139mL ISOVUE-300 IOPAMIDOL (ISOVUE-300) INJECTION 61% COMPARISON:  None. FINDINGS: The visualized lung bases are clear. Diffuse coronary artery calcifications are seen. The patient is status post median sternotomy. The liver and spleen are unremarkable in appearance. The gallbladder is within normal limits. The pancreas and adrenal glands are unremarkable. Nonspecific perinephric stranding is noted bilaterally. The kidneys are otherwise unremarkable. There is no evidence of hydronephrosis. No renal or ureteral stones are seen. No free fluid is identified. The small bowel is unremarkable in appearance. The stomach is within normal limits. No acute vascular abnormalities are seen. The patient is status post appendectomy. The colon is grossly unremarkable in appearance. The bladder is mildly distended and grossly unremarkable. The prostate is normal in size, with minimal calcification. No inguinal lymphadenopathy is seen. No acute osseous abnormalities are identified. Anterior bridging osteophytes are seen along the lower thoracic spine. IMPRESSION: 1. No acute abnormality seen within the abdomen or pelvis. 2. Diffuse coronary artery calcifications seen. Electronically Signed   By: Garald Balding M.D.   On: 05/16/2016 02:15   I have personally reviewed and evaluated these images and lab results as part of my medical decision-making.   EKG Interpretation   Date/Time:  Saturday May 15 2016 23:08:55 EDT Ventricular Rate:  88 PR Interval:  151 QRS Duration: 82 QT Interval:  345 QTC Calculation: 417 R Axis:   78 Text Interpretation:  Sinus rhythm Low voltage, precordial leads Confirmed  by Jeneen Rinks  MD, [place] ([id number]) on 05/15/2016 11:18:57 PM      MDM   Final diagnoses:  RUQ pain   Constant R upper abdominal pain since 7 PM. No chest pain. No nausea or vomiting. Dissimilar to previous angina. EKG is nonischemic.  LFTs and lipase normal. Troponin normal.  We'll obtain CT scan and workup upper abdominal pain.  EKG unchanged. Troponin negative 2. Doubt cardiac etiology of pain.  CT scan reassuring. Normal-appearing gallbladder.  We'll arrange for outpatient ultrasound of his gallbladder.  Patient feels improved in the ED. No vomiting. We'll start PPI. Arrange for outpatient gallbladder ultrasound. Troponin negative 2. EKG nonischemic. Pain dissimilar to previous cardiac pain. Doubt cardiac etiology of pain today. Return precautions discussed.   I personally performed the services described in this documentation, which was scribed in my presence. The recorded information has been reviewed and is accurate.  [name], MD 05/16/16 0530

## 2016-05-15 NOTE — ED Notes (Signed)
Pt c/o upper generalized abd pain that started this evening, denies any n/v/d,

## 2016-05-16 ENCOUNTER — Emergency Department (HOSPITAL_COMMUNITY): Payer: Federal, State, Local not specified - PPO

## 2016-05-16 ENCOUNTER — Ambulatory Visit (HOSPITAL_COMMUNITY)
Admission: RE | Admit: 2016-05-16 | Discharge: 2016-05-16 | Disposition: A | Discharge status: Home / Self Care | Payer: Federal, State, Local not specified - PPO | Source: Ambulatory Visit | Attending: Emergency Medicine | Admitting: Emergency Medicine

## 2016-05-16 DIAGNOSIS — R1013 Epigastric pain: Secondary | ICD-10-CM | POA: Diagnosis not present

## 2016-05-16 DIAGNOSIS — R1011 Right upper quadrant pain: Secondary | ICD-10-CM | POA: Insufficient documentation

## 2016-05-16 DIAGNOSIS — K828 Other specified diseases of gallbladder: Secondary | ICD-10-CM | POA: Diagnosis not present

## 2016-05-16 LAB — URINALYSIS, ROUTINE W REFLEX MICROSCOPIC
Bilirubin Urine: NEGATIVE
Glucose, UA: 250 mg/dL — AB
Hgb urine dipstick: NEGATIVE
Ketones, ur: NEGATIVE mg/dL
Leukocytes, UA: NEGATIVE
Nitrite: NEGATIVE
Protein, ur: NEGATIVE mg/dL
Specific Gravity, Urine: 1.025 (ref 1.005–1.030)
pH: 5.5 (ref 5.0–8.0)

## 2016-05-16 LAB — TROPONIN I: Troponin I: <0.03 ng/mL (ref <0.031)

## 2016-05-16 LAB — CBG MONITORING, ED: Glucose-Capillary: 176 mg/dL — ABNORMAL HIGH (ref 65–99)

## 2016-05-16 MED ORDER — OMEPRAZOLE 20 MG PO CPDR: 20.0000 mg | DELAYED_RELEASE_CAPSULE | Freq: Every day | ORAL | Status: DC

## 2016-05-16 MED ORDER — IOPAMIDOL (ISOVUE-300) INJECTION 61%
100.0000 mL | Freq: Once | INTRAVENOUS | Status: AC | PRN
  Administered 2016-05-16: 100 mL via INTRAVENOUS

## 2016-05-16 MED ORDER — DIATRIZOATE MEGLUMINE & SODIUM 66-10 % PO SOLN
ORAL | Status: AC
  Filled 2016-05-16: qty 30

## 2016-05-16 MED ORDER — GI COCKTAIL ~~LOC~~
30.0000 mL | Freq: Once | ORAL | Status: AC
  Administered 2016-05-16: 30 mL via ORAL
  Filled 2016-05-16: qty 30

## 2016-05-16 NOTE — ED Provider Notes (Signed)
Evaluation post imaging- ultrasound abdomen.  The patient states that he feels better at this time. He has not started taking the medication prescribed by Dr. Wyvonnia Dusky, Prilosec. Currently taking Zantac.  Findings discussed with the patient. Ultrasound normal. He plans on starting the PPI, using Maalox as needed and discontinue Zantac and a few days. He will follow-up with his PCP in a week or so.  Daleen Bo, MD 05/16/16 [phone]

## 2016-05-16 NOTE — ED Notes (Signed)
Pt given diet coke. 

## 2016-05-16 NOTE — Discharge Instructions (Signed)
Abdominal Pain, Adult Followup for an ultrasound of your gallbladder. Return to the ED if you develop chest pain, shortness of breath, or any other concerns. Many things can cause abdominal pain. Usually, abdominal pain is not caused by a disease and will improve without treatment. It can often be observed and treated at home. Your health care provider will do a physical exam and possibly order blood tests and X-rays to help determine the seriousness of your pain. However, in many cases, more time must pass before a clear cause of the pain can be found. Before that point, your health care provider may not know if you need more testing or further treatment. HOME CARE INSTRUCTIONS Monitor your abdominal pain for any changes. The following actions may help to alleviate any discomfort you are experiencing:  Only take over-the-counter or prescription medicines as directed by your health care provider.  Do not take laxatives unless directed to do so by your health care provider.  Try a clear liquid diet (broth, tea, or water) as directed by your health care provider. Slowly move to a bland diet as tolerated. SEEK MEDICAL CARE IF:  You have unexplained abdominal pain.  You have abdominal pain associated with nausea or diarrhea.  You have pain when you urinate or have a bowel movement.  You experience abdominal pain that wakes you in the night.  You have abdominal pain that is worsened or improved by eating food.  You have abdominal pain that is worsened with eating fatty foods.  You have a fever. SEEK IMMEDIATE MEDICAL CARE IF:  Your pain does not go away within 2 hours.  You keep throwing up (vomiting).  Your pain is felt only in portions of the abdomen, such as the right side or the left lower portion of the abdomen.  You pass bloody or black tarry stools. MAKE SURE YOU:  Understand these instructions.  Will watch your condition.  Will get help right away if you are not doing well  or get worse.   This information is not intended to replace advice given to you by your health care provider. Make sure you discuss any questions you have with your health care provider.   Document Released: 09/08/2005 Document Revised: 08/20/2015 Document Reviewed: 08/08/2013 Elsevier Interactive Patient Education [employer].

## 2016-05-24 DIAGNOSIS — Z1389 Encounter for screening for other disorder: Secondary | ICD-10-CM | POA: Diagnosis not present

## 2016-05-24 DIAGNOSIS — R1011 Right upper quadrant pain: Secondary | ICD-10-CM | POA: Diagnosis not present

## 2016-05-24 DIAGNOSIS — Z6839 Body mass index (BMI) 39.0-39.9, adult: Secondary | ICD-10-CM | POA: Diagnosis not present

## 2016-06-16 ENCOUNTER — Ambulatory Visit: Payer: Federal, State, Local not specified - PPO | Admitting: Cardiology

## 2016-06-18 DIAGNOSIS — M170 Bilateral primary osteoarthritis of knee: Secondary | ICD-10-CM | POA: Diagnosis not present

## 2016-06-18 DIAGNOSIS — Z1389 Encounter for screening for other disorder: Secondary | ICD-10-CM | POA: Diagnosis not present

## 2016-06-18 DIAGNOSIS — Z6841 Body Mass Index (BMI) 40.0 and over, adult: Secondary | ICD-10-CM | POA: Diagnosis not present

## 2016-06-28 ENCOUNTER — Ambulatory Visit (INDEPENDENT_AMBULATORY_CARE_PROVIDER_SITE_OTHER): Payer: Federal, State, Local not specified - PPO | Admitting: Orthopedic Surgery

## 2016-06-28 ENCOUNTER — Ambulatory Visit (INDEPENDENT_AMBULATORY_CARE_PROVIDER_SITE_OTHER): Payer: Federal, State, Local not specified - PPO

## 2016-06-28 ENCOUNTER — Encounter: Payer: Self-pay | Admitting: Orthopedic Surgery

## 2016-06-28 VITALS — BP 127/81 | Ht 69.0 in | Wt 273.0 lb

## 2016-06-28 DIAGNOSIS — M25562 Pain in left knee: Secondary | ICD-10-CM

## 2016-06-28 DIAGNOSIS — M25561 Pain in right knee: Secondary | ICD-10-CM

## 2016-06-28 DIAGNOSIS — M170 Bilateral primary osteoarthritis of knee: Secondary | ICD-10-CM

## 2016-06-28 NOTE — Patient Instructions (Signed)
The patient will see the cardiologist this week and then the cardiologist send [place] a clearance note for his left total knee surgery  No date is been set at this time

## 2016-06-28 NOTE — Progress Notes (Signed)
Patient ID: [name], male   DOB: 03/17/1961, 55 y.o.   MRN: [id number]  Chief Complaint  Patient presents with  . Knee Pain    bilateral knee pain    HPI 46 rolled male prior knee arthroscopy presents with diffuse left knee pain and right knee pain for that matter but left knee seems worse with medial sharp severe pain worsening over the last year worse with activity and worsening in intensity worsened also with certain activities of daily living.    ROS bypass surgery. Currently no chest pain seems to have a tendency to hold fluid in his lower extremities with edema which is intermittent. No heartburn frequency skin changes numbness tingling anxiety depression easy bleeding or bruising excessive thirst food reactions headache blurred vision  Past Medical History  Diagnosis Date  . Essential hypertension   . Type 2 diabetes mellitus ([name])   . Hyperlipidemia   . Arthritis   . CAD (coronary artery disease)     Multivessel disease status post CABG 08/2015    BP 127/81 mmHg  Ht 5\' 9"  (1.753 m)  Wt 273 lb (123.832 kg)  BMI 40.30 kg/m2  Physical Exam Physical Exam  Constitutional: The patient appears well-developed and well-nourished. No distress.  The patient is oriented to person, place, and time.  Psychiatric: The patient has a normal mood and affect.  Cardiovascular: Intact distal pulses.  IN BOTH LEGS  Neurological: sensation is normal IS NORMAL IN BOTH LEGS  Skin: Skin is warm and dry. No rash noted. The patient is not diaphoretic. No erythema. No pallor.   Ortho Exam Bilateral knee examination bilateral varus knees. Both knees have medial joint line tenderness no effusion both knees are stable quadriceps strength is normal except mechanism is intact skin normal both legs with some mild peripheral discoloration from chronic edema. Distal pulses are intact and normal sensation is noted in both lower extremities. Knee flexion on the left is 115 on the right is the  same   ASSESSMENT AND PLAN   Bilateral knee pain bilateral knee arthritis, bilateral knee x-rays show severe varus 3 compartment disease and medial joint space narrowing  Patient would like to proceed with total knee replacement  He will see the cardiologist this week and call us and let us know that he is cleared for left total knee Arther Abbott, MD 06/28/2016 3:56 PM

## 2016-06-30 ENCOUNTER — Encounter: Payer: Self-pay | Admitting: Cardiology

## 2016-06-30 ENCOUNTER — Ambulatory Visit (INDEPENDENT_AMBULATORY_CARE_PROVIDER_SITE_OTHER): Payer: Federal, State, Local not specified - PPO | Admitting: Cardiology

## 2016-06-30 VITALS — BP 102/70 | HR 98 | Ht 69.0 in | Wt 268.0 lb

## 2016-06-30 DIAGNOSIS — I2510 Atherosclerotic heart disease of native coronary artery without angina pectoris: Secondary | ICD-10-CM | POA: Diagnosis not present

## 2016-06-30 DIAGNOSIS — E785 Hyperlipidemia, unspecified: Secondary | ICD-10-CM | POA: Diagnosis not present

## 2016-06-30 DIAGNOSIS — Z01810 Encounter for preprocedural cardiovascular examination: Secondary | ICD-10-CM

## 2016-06-30 DIAGNOSIS — I10 Essential (primary) hypertension: Secondary | ICD-10-CM | POA: Diagnosis not present

## 2016-06-30 NOTE — Progress Notes (Signed)
Cardiology Office Note  Date: 06/30/2016   ID: [name], DOB December 29, 1960, MRN [id number]  PCP: Purvis Kilts, MD  Primary Cardiologist: Rozann Lesches, MD   Chief Complaint  Patient presents with  . Coronary Artery Disease    History of Present Illness: [name] is a 55 y.o. male last seen by [name] in February. He presents for a routine follow-up visit. Overall doing well from a cardiac perspective without angina symptoms. Still has some mild residual musculoskeletal chest soreness after CABG last September. He completed cardiac rehabilitation and was exercising in the maintenance program, but has been limited by progressive bilateral knee pain. He saw Dr. Aline Brochure recently and is being considered for knee replacement ultimately. At this point he feels like his left knee is bothering him the most, and he may be considering surgery sometime in September which would be a year out from his CABG.  I reviewed his medications which are outlined below. He reports no significant changes in cardiac regimen.  Blood pressure is well controlled today.  Past Medical History  Diagnosis Date  . Essential hypertension   . Type 2 diabetes mellitus ([name])   . Hyperlipidemia   . Arthritis   . CAD (coronary artery disease)     Multivessel disease status post CABG 08/2015    Past Surgical History  Procedure Laterality Date  . Appendectomy    . Biceps tendon surgery Right   . Knee arthroscopy Left   . Cardiac catheterization N/A 08/25/2015    Procedure: Left Heart Cath and Coronary Angiography;  Surgeon: Belva Crome, MD;  Location: [facility];  Service: Cardiovascular;  Laterality: N/A;  . Coronary artery bypass graft N/A 08/29/2015    Procedure: CORONARY ARTERY BYPASS GRAFTING (CABG);  Surgeon: [name], MD;  Location: [facility];  Service: Open Heart Surgery;  Laterality: N/A;  . Tee without cardioversion N/A 08/29/2015    Procedure:  TRANSESOPHAGEAL ECHOCARDIOGRAM (TEE);  Surgeon: [name], MD;  Location: [facility];  Service: Open Heart Surgery;  Laterality: N/A;    Current Outpatient Prescriptions  Medication Sig Dispense Refill  . aspirin EC 325 MG EC tablet Take 1 tablet (325 mg total) by mouth daily. 30 tablet 0  . furosemide (LASIX) 20 MG tablet Take 20 mg by mouth.    . hydrochlorothiazide (HYDRODIURIL) 25 MG tablet 12.5 mg BID  0  . HYDROcodone-acetaminophen (NORCO/VICODIN) 5-325 MG tablet Take 1 tablet by mouth every 6 (six) hours as needed for moderate pain.    [name] insulin lispro protamine-lispro (HUMALOG 75/25 MIX) (75-25) 100 UNIT/ML SUSP injection Inject 70 Units into the skin 2 (two) times daily with a meal.    . KOMBIGLYZE XR 2.04-999 MG TB24 Take 1 tablet by mouth 2 (two) times daily.     . meloxicam (MOBIC) 15 MG tablet Take 15 mg by mouth daily.    . metoprolol tartrate (LOPRESSOR) 25 MG tablet Take 0.5 tablets (12.5 mg total) by mouth 2 (two) times daily. 90 tablet 3  . Multiple Vitamin (MULTIVITAMIN WITH MINERALS) TABS tablet Take 1 tablet by mouth daily.    [name] olmesartan (BENICAR) 40 MG tablet Take 1 tablet (40 mg total) by mouth daily. 90 tablet 3  . omeprazole (PRILOSEC) 20 MG capsule Take 1 capsule (20 mg total) by mouth daily. (Patient taking differently: Take 40 mg by mouth daily. ) 30 capsule 0  . pravastatin (PRAVACHOL) 20 MG tablet Take 1 tablet (20 mg  total) by mouth at bedtime. 90 tablet 3  . traMADol-acetaminophen (ULTRACET) 37.5-325 MG tablet Take 1 tablet by mouth every 4 (four) hours as needed. 90 tablet 5   No current facility-administered medications for this visit.   Allergies:  Review of patient's allergies indicates no known allergies.   Social History: The patient  reports that he has never smoked. He does not have any smokeless tobacco history on file. He reports that he does not drink alcohol or use illicit drugs.   ROS:  Please see the history of present illness. Otherwise,  complete review of systems is positive for bilateral knee pain.  All other systems are reviewed and negative.   Physical Exam: VS:  BP 102/70 mmHg  Pulse 98  Ht 5\' 9"  (1.753 m)  Wt 268 lb (121.564 kg)  BMI 39.56 kg/m2  SpO2 99%, BMI Body mass index is 39.56 kg/(m^2).  Wt Readings from Last 3 Encounters:  06/30/16 268 lb (121.564 kg)  06/28/16 273 lb (123.832 kg)  05/15/16 260 lb (117.935 kg)    General: Obese male, appears comfortable at rest. HEENT: Conjunctiva and lids normal, oropharynx clear. Neck: Supple, no elevated JVP or carotid bruits, no thyromegaly. Thorax: Well-healed sternal incision. Lungs: Clear to auscultation, nonlabored breathing at rest. Cardiac: Regular rate and rhythm, no S3 or significant systolic murmur, no pericardial rub. Abdomen: Soft, nontender, bowel sounds present, no guarding or rebound. Extremities: No pitting edema, distal pulses 2+. Skin: Warm and dry. Musculoskeletal: No kyphosis. Neuropsychiatric: Alert and oriented x3, affect grossly appropriate.  ECG: I personally reviewed the tracing from 05/15/2016 which showed sinus rhythm with decreased R wave progression and nonspecific ST changes.  Recent Labwork: 08/30/2015: Magnesium 2.3 05/15/2016: ALT 27; AST 43*; BUN 22*; Creatinine, Ser 1.31*; Hemoglobin 13.7; Platelets 174; Potassium 3.9; Sodium 134*     Component Value Date/Time   CHOL 166 12/18/2015 0819   TRIG 205* 12/18/2015 0819   HDL 35* 12/18/2015 0819   CHOLHDL 4.7 12/18/2015 0819   VLDL 41* 12/18/2015 0819   LDLCALC 90 12/18/2015 0819    Other Studies Reviewed Today:  Echocardiogram 08/24/2015: Study Conclusions  - Left ventricle: The cavity size was normal. Wall thickness was increased in a pattern of mild LVH. Systolic function was mildly reduced. The estimated ejection fraction was in the range of 45% to 50%. Left ventricular diastolic function parameters were normal.  Assessment and Plan:  1. Multivessel CAD status  post CABG in September 2016. Patient is doing reasonably well, no active angina symptoms. Plan is to continue medical therapy and observation.  2. Preoperative evaluation, patient considering possible left knee replacement sometime in September or October. He will be a year out from CABG at that time and I would not anticipate any follow-up ischemic testing unless he develops any interval symptoms. Reviewed his recent ECG.  3. Essential hypertension, blood pressure is well controlled today.  4. Hyperlipidemia, continues on Pravachol. Last LDL was 90. Weight loss encouraged.  Current medicines were reviewed with the patient today.  Disposition: Follow-up with me in 6 months.  Signed, Satira Sark, MD, [facility] 06/30/2016 2:15 PM    [facility] at [facility] [address], [place], [place] [address] Phone: [phone]; Fax: [phone]

## 2016-06-30 NOTE — Patient Instructions (Signed)
Your physician wants you to follow-up in: 6 months You will receive a reminder letter in the mail two months in advance. If you don't receive a letter, please call our office to schedule the follow-up appointment.     Your physician recommends that you continue on your current medications as directed. Please refer to the Current Medication list given to you today.      Thank you for choosing [facility] !        

## 2016-07-07 ENCOUNTER — Other Ambulatory Visit: Payer: Self-pay | Admitting: *Deleted

## 2016-07-08 ENCOUNTER — Emergency Department (HOSPITAL_COMMUNITY): Payer: Federal, State, Local not specified - PPO

## 2016-07-08 ENCOUNTER — Emergency Department (HOSPITAL_COMMUNITY)
Admission: EM | Admit: 2016-07-08 | Discharge: 2016-07-08 | Disposition: A | Discharge status: Home / Self Care | Payer: Federal, State, Local not specified - PPO | Attending: Emergency Medicine | Admitting: Emergency Medicine

## 2016-07-08 ENCOUNTER — Encounter (HOSPITAL_COMMUNITY): Payer: Self-pay | Admitting: Emergency Medicine

## 2016-07-08 DIAGNOSIS — Z794 Long term (current) use of insulin: Secondary | ICD-10-CM | POA: Insufficient documentation

## 2016-07-08 DIAGNOSIS — Z951 Presence of aortocoronary bypass graft: Secondary | ICD-10-CM | POA: Insufficient documentation

## 2016-07-08 DIAGNOSIS — I10 Essential (primary) hypertension: Secondary | ICD-10-CM | POA: Diagnosis not present

## 2016-07-08 DIAGNOSIS — E1129 Type 2 diabetes mellitus with other diabetic kidney complication: Secondary | ICD-10-CM | POA: Diagnosis not present

## 2016-07-08 DIAGNOSIS — E782 Mixed hyperlipidemia: Secondary | ICD-10-CM | POA: Diagnosis not present

## 2016-07-08 DIAGNOSIS — E119 Type 2 diabetes mellitus without complications: Secondary | ICD-10-CM | POA: Diagnosis not present

## 2016-07-08 DIAGNOSIS — I5022 Chronic systolic (congestive) heart failure: Secondary | ICD-10-CM | POA: Insufficient documentation

## 2016-07-08 DIAGNOSIS — R200 Anesthesia of skin: Secondary | ICD-10-CM | POA: Diagnosis not present

## 2016-07-08 DIAGNOSIS — I2510 Atherosclerotic heart disease of native coronary artery without angina pectoris: Secondary | ICD-10-CM | POA: Insufficient documentation

## 2016-07-08 DIAGNOSIS — I6789 Other cerebrovascular disease: Secondary | ICD-10-CM | POA: Diagnosis not present

## 2016-07-08 DIAGNOSIS — Z681 Body mass index (BMI) 19 or less, adult: Secondary | ICD-10-CM | POA: Diagnosis not present

## 2016-07-08 DIAGNOSIS — G510 Bell's palsy: Secondary | ICD-10-CM | POA: Diagnosis not present

## 2016-07-08 DIAGNOSIS — Z7982 Long term (current) use of aspirin: Secondary | ICD-10-CM | POA: Diagnosis not present

## 2016-07-08 DIAGNOSIS — Z1389 Encounter for screening for other disorder: Secondary | ICD-10-CM | POA: Diagnosis not present

## 2016-07-08 DIAGNOSIS — I110 Hypertensive heart disease with heart failure: Secondary | ICD-10-CM | POA: Diagnosis not present

## 2016-07-08 DIAGNOSIS — Z79899 Other long term (current) drug therapy: Secondary | ICD-10-CM | POA: Insufficient documentation

## 2016-07-08 DIAGNOSIS — R531 Weakness: Secondary | ICD-10-CM | POA: Diagnosis not present

## 2016-07-08 HISTORY — DX: Essential (primary) hypertension: I10

## 2016-07-08 LAB — BASIC METABOLIC PANEL
Anion gap: 10 (ref 5–15)
BUN: 19 mg/dL (ref 6–20)
CO2: 24 mmol/L (ref 22–32)
Calcium: 9.4 mg/dL (ref 8.9–10.3)
Chloride: 101 mmol/L (ref 101–111)
Creatinine, Ser: 1.5 mg/dL — ABNORMAL HIGH (ref 0.61–1.24)
GFR calc Af Amer: 58 mL/min — ABNORMAL LOW (ref >60)
GFR calc non Af Amer: 50 mL/min — ABNORMAL LOW (ref >60)
Glucose, Bld: 196 mg/dL — ABNORMAL HIGH (ref 65–99)
Potassium: 3.7 mmol/L (ref 3.5–5.1)
Sodium: 135 mmol/L (ref 135–145)

## 2016-07-08 LAB — CBC WITH DIFFERENTIAL/PLATELET
Basophils Absolute: 0 10*3/uL (ref 0.0–0.1)
Basophils Relative: 0 %
Eosinophils Absolute: 0.1 10*3/uL (ref 0.0–0.7)
Eosinophils Relative: 1 %
HCT: 37.4 % — ABNORMAL LOW (ref 39.0–52.0)
Hemoglobin: 13.1 g/dL (ref 13.0–17.0)
Lymphocytes Relative: 30 %
Lymphs Abs: 1.7 10*3/uL (ref 0.7–4.0)
MCH: 32.3 pg (ref 26.0–34.0)
MCHC: 35 g/dL (ref 30.0–36.0)
MCV: 92.3 fL (ref 78.0–100.0)
Monocytes Absolute: 0.5 10*3/uL (ref 0.1–1.0)
Monocytes Relative: 8 %
Neutro Abs: 3.5 10*3/uL (ref 1.7–7.7)
Neutrophils Relative %: 61 %
Platelets: 141 10*3/uL — ABNORMAL LOW (ref 150–400)
RBC: 4.05 MIL/uL — ABNORMAL LOW (ref 4.22–5.81)
RDW: 13 % (ref 11.5–15.5)
WBC: 5.8 10*3/uL (ref 4.0–10.5)

## 2016-07-08 LAB — CBG MONITORING, ED: Glucose-Capillary: 202 mg/dL — ABNORMAL HIGH (ref 65–99)

## 2016-07-08 MED ORDER — PREDNISONE 20 MG PO TABS
60.0000 mg | ORAL_TABLET | Freq: Once | ORAL | Status: AC
  Administered 2016-07-08: 60 mg via ORAL
  Filled 2016-07-08: qty 3

## 2016-07-08 MED ORDER — PREDNISONE 20 MG PO TABS: 40.0000 mg | ORAL_TABLET | Freq: Every day | ORAL | 0 refills | Status: DC

## 2016-07-08 NOTE — ED Notes (Signed)
Patient transported to CT 

## 2016-07-08 NOTE — ED Notes (Signed)
MD at bedside. 

## 2016-07-08 NOTE — ED Provider Notes (Signed)
[facility] DEPT Provider Note   CSN: [id number] Arrival date & time: 07/08/16  [id number]  First Provider Contact:  First MD Initiated Contact with Patient 07/08/16 1937   By signing my name below, I, Evelene Croon, attest that this documentation has been prepared under the direction and in the presence of Virgel Manifold, MD . Electronically Signed: Evelene Croon, Scribe. 07/08/2016. 8:06 PM.  History   Chief Complaint Chief Complaint  Patient presents with  . Numbness    The history is provided by the EMS personnel and the patient. No language interpreter was used.   HPI Comments:  [name] is a 55 y.o. male with a history of CAD, CABG x 4, HTN and DM, who presents to the Emergency Department via EMS complaining of numbness to both sides of his mouth. He notes the numbness intially began 3 days ago and was intermittent until today. He denies numbness when he went to bed last night. Pt notes mild numbness to his right cheek; states he feels as if the muscles in his face are not working as they should. He reports associated blurred vision, change in his taste ("everything tastes the same") and difficulty speaking. Pt states he knows what he wants to say but is having trouble saying it.  He denies h/o TIA and CVA. Pt also denies CP, SOB, changes in hearing, nausea and vomiting. Pt also has a h/o bells palsy on the left ~ 30 years ago. No alleviating factors noted. No ASA taken today.   Past Medical History:  Diagnosis Date  . Arthritis   . CAD (coronary artery disease)    Multivessel disease status post CABG 08/2015  . Essential hypertension   . Hyperlipidemia   . Hypertension   . Type 2 diabetes mellitus [facility])     Patient Active Problem List   Diagnosis Date Noted  . OSA on CPAP 03/02/2016  . Hypoxia, sleep related 03/02/2016  . Cyst of mediastinum 01/15/2016  . S/P CABG x 4 08/29/2015  . Coronary artery disease involving native coronary artery with other forms of angina  pectoris ([place])   . Morbid obesity ([place])   . Hyperglycemia   . Chronic systolic HF (heart failure) ([place])   . Pain in the chest 08/23/2015  . Non-ST elevation MI (NSTEMI) ([place]) 08/23/2015  . Diabetes mellitus type 2, uncontrolled ([place]) 08/23/2015  . Hypertension 08/23/2015  . Hyperlipidemia 08/23/2015  . Osteoarthritis, knee 05/24/2012  . Knee pain 10/29/2011  . Knee stiffness 10/29/2011  . S/P right knee arthroscopy 10/26/2011  . Medial meniscus, posterior horn derangement 09/07/2011  . Lateral meniscus derangement 09/07/2011  . OA (osteoarthritis) of knee 09/07/2011  . Rotator cuff syndrome of left shoulder 08/19/2011  . Right knee meniscal tear 08/19/2011    Past Surgical History:  Procedure Laterality Date  . APPENDECTOMY    . Biceps tendon surgery Right   . CARDIAC CATHETERIZATION N/A 08/25/2015   Procedure: Left Heart Cath and Coronary Angiography;  Surgeon: Belva Crome, MD;  Location: [facility];  Service: Cardiovascular;  Laterality: N/A;  . CORONARY ARTERY BYPASS GRAFT N/A 08/29/2015   Procedure: CORONARY ARTERY BYPASS GRAFTING (CABG);  Surgeon: [name], MD;  Location: [facility];  Service: Open Heart Surgery;  Laterality: N/A;  . KNEE ARTHROSCOPY Left   . TEE WITHOUT CARDIOVERSION N/A 08/29/2015   Procedure: TRANSESOPHAGEAL ECHOCARDIOGRAM (TEE);  Surgeon: [name], MD;  Location: [facility];  Service: Open Heart Surgery;  Laterality: N/A;  Home Medications    Prior to Admission medications   Medication Sig Start Date End Date Taking? Authorizing Provider  aspirin EC 325 MG EC tablet Take 1 tablet (325 mg total) by mouth daily. 09/04/15   Erin R Barrett, PA-C  furosemide (LASIX) 20 MG tablet Take 20 mg by mouth.    Historical Provider, MD  hydrochlorothiazide (HYDRODIURIL) 25 MG tablet 12.5 mg BID 05/07/16   Historical Provider, MD  HYDROcodone-acetaminophen (NORCO/VICODIN) 5-325 MG tablet Take 1 tablet by mouth every 6 (six) hours as needed for  moderate pain.    Historical Provider, MD  insulin lispro protamine-lispro (HUMALOG 75/25 MIX) (75-25) 100 UNIT/ML SUSP injection Inject 70 Units into the skin 2 (two) times daily with a meal.    Historical Provider, MD  KOMBIGLYZE XR 2.04-999 MG TB24 Take 1 tablet by mouth 2 (two) times daily.  07/28/11   Historical Provider, MD  meloxicam (MOBIC) 15 MG tablet Take 15 mg by mouth daily.    Historical Provider, MD  metoprolol tartrate (LOPRESSOR) 25 MG tablet Take 0.5 tablets (12.5 mg total) by mouth 2 (two) times daily. 09/18/15   Lendon Colonel, NP  Multiple Vitamin (MULTIVITAMIN WITH MINERALS) TABS tablet Take 1 tablet by mouth daily.    Historical Provider, MD  olmesartan (BENICAR) 40 MG tablet Take 1 tablet (40 mg total) by mouth daily. 09/18/15   Lendon Colonel, NP  omeprazole (PRILOSEC) 20 MG capsule Take 1 capsule (20 mg total) by mouth daily. Patient taking differently: Take 40 mg by mouth daily.  05/16/16   [name], MD  pravastatin (PRAVACHOL) 20 MG tablet Take 1 tablet (20 mg total) by mouth at bedtime. 09/18/15   Lendon Colonel, NP  traMADol-acetaminophen (ULTRACET) 37.5-325 MG tablet Take 1 tablet by mouth every 4 (four) hours as needed. 02/02/16 10/19/16  Carole Civil, MD    Family History Family History  Problem Relation Age of Onset  . Arthritis    . Cancer    . Diabetes    . CAD Father   . Diabetes Mellitus II Father   . CAD Brother   . Diabetes Mellitus II Brother   . Anesthesia problems Neg Hx   . Hypotension Neg Hx   . Malignant hyperthermia Neg Hx   . Pseudochol deficiency Neg Hx     Social History Social History  Substance Use Topics  . Smoking status: Never Smoker  . Smokeless tobacco: Not on file  . Alcohol use No     Allergies   Review of patient's allergies indicates no known allergies.   Review of Systems Review of Systems  HENT: Negative for hearing loss.   Eyes: Positive for visual disturbance.  Respiratory: Negative for  shortness of breath.   Cardiovascular: Negative for chest pain.  Gastrointestinal: Negative for nausea and vomiting.  Neurological: Positive for speech difficulty and numbness.  All other systems reviewed and are negative.  Physical Exam Updated Vital Signs BP 122/74 (BP Location: Right Arm)   Pulse 80   Temp 98.2 F (36.8 C) (Oral)   Resp 20   Ht 5\' 10"  (1.778 m)   Wt 272 lb (123.4 kg)   SpO2 96%   BMI 39.03 kg/m   Physical Exam  Constitutional: He is oriented to person, place, and time. He appears well-developed and well-nourished.  HENT:  Head: Normocephalic and atraumatic.  Eyes: EOM are normal.  Neck: Normal range of motion.  Cardiovascular: Normal rate, regular rhythm, normal heart sounds and intact  distal pulses.   Pulmonary/Chest: Effort normal and breath sounds normal. No respiratory distress.  Abdominal: Soft. He exhibits no distension. There is no tenderness.  Musculoskeletal: Normal range of motion. He exhibits edema.  Mild symmetric lower extremity edema   Neurological: He is alert and oriented to person, place, and time.  Decreased sensation to light touch on the right V2 distribution Right facial droop Speech is slightly dysarthric but understandable   Skin: Skin is warm and dry.  Psychiatric: He has a normal mood and affect. Judgment normal.  Nursing note and vitals reviewed.   ED Treatments / Results  DIAGNOSTIC STUDIES:  Oxygen Saturation is 99% on RA, normal by my interpretation.    COORDINATION OF CARE:  7:49 PM Discussed treatment plan with pt at bedside and pt agreed to plan.  Labs (all labs ordered are listed, but only abnormal results are displayed) Labs Reviewed - No data to display  EKG  EKG Interpretation None       Radiology No results found.  Procedures Procedures  Medications Ordered in ED Medications - No data to display   Initial Impression / Assessment and Plan / ED Course  I have reviewed the triage vital signs  and the nursing notes.  Pertinent labs & imaging results that were available during my care of the patient were reviewed by me and considered in my medical decision making (see chart for details).  Clinical Course    60y male with what I suspect his Bell's palsy. Doubt central cause. Forehead is not spared. Course of steroids. Return precautions discussed.  Final Clinical Impressions(s) / ED Diagnoses   Final diagnoses:  Bell's palsy    New Prescriptions New Prescriptions   No medications on file   I personally preformed the services scribed in my presence. The recorded information has been reviewed is accurate. Virgel Manifold, MD.     Virgel Manifold, MD 07/19/16 [phone]

## 2016-07-08 NOTE — ED Triage Notes (Signed)
Pt was sent by PCP after reports of numbness to the right side of her face. Also reports blurry vision and changes in his speech. He reports this has been going on for 3 days but was constant today.  Hx of CABGE X4, bells palusy, diabeties and HNT.

## 2016-07-08 NOTE — ED Notes (Signed)
cbg 202/notified nurse

## 2016-07-23 DIAGNOSIS — Z6841 Body Mass Index (BMI) 40.0 and over, adult: Secondary | ICD-10-CM | POA: Diagnosis not present

## 2016-07-23 DIAGNOSIS — I2510 Atherosclerotic heart disease of native coronary artery without angina pectoris: Secondary | ICD-10-CM | POA: Diagnosis not present

## 2016-07-23 DIAGNOSIS — L089 Local infection of the skin and subcutaneous tissue, unspecified: Secondary | ICD-10-CM | POA: Diagnosis not present

## 2016-07-23 DIAGNOSIS — E119 Type 2 diabetes mellitus without complications: Secondary | ICD-10-CM | POA: Diagnosis not present

## 2016-07-23 DIAGNOSIS — E1140 Type 2 diabetes mellitus with diabetic neuropathy, unspecified: Secondary | ICD-10-CM | POA: Diagnosis not present

## 2016-07-23 DIAGNOSIS — Z1389 Encounter for screening for other disorder: Secondary | ICD-10-CM | POA: Diagnosis not present

## 2016-07-23 DIAGNOSIS — J069 Acute upper respiratory infection, unspecified: Secondary | ICD-10-CM | POA: Diagnosis not present

## 2016-09-02 DIAGNOSIS — E7800 Pure hypercholesterolemia, unspecified: Secondary | ICD-10-CM | POA: Diagnosis not present

## 2016-09-02 DIAGNOSIS — E1165 Type 2 diabetes mellitus with hyperglycemia: Secondary | ICD-10-CM | POA: Diagnosis not present

## 2016-09-06 DIAGNOSIS — G4733 Obstructive sleep apnea (adult) (pediatric): Secondary | ICD-10-CM | POA: Diagnosis not present

## 2016-09-08 ENCOUNTER — Encounter: Payer: Self-pay | Admitting: *Deleted

## 2016-09-08 NOTE — Progress Notes (Signed)
[employer] AUTHORIZED INPATIENT SURGERY SCHEDULED FOR 09/16/16 AUTH [id number]

## 2016-09-08 NOTE — Telephone Encounter (Signed)
This encounter was created in error - please disregard.

## 2016-09-09 DIAGNOSIS — I10 Essential (primary) hypertension: Secondary | ICD-10-CM | POA: Diagnosis not present

## 2016-09-09 DIAGNOSIS — N189 Chronic kidney disease, unspecified: Secondary | ICD-10-CM | POA: Diagnosis not present

## 2016-09-09 DIAGNOSIS — E1165 Type 2 diabetes mellitus with hyperglycemia: Secondary | ICD-10-CM | POA: Diagnosis not present

## 2016-09-09 DIAGNOSIS — E7800 Pure hypercholesterolemia, unspecified: Secondary | ICD-10-CM | POA: Diagnosis not present

## 2016-09-10 NOTE — Patient Instructions (Signed)
[name]  09/10/2016     @PREFPERIOPPHARMACY @   Your procedure is scheduled on 09/16/2016   Report to [name] at  615   A.M.  Call this number if you have problems the morning of surgery:  [phone]   Remember:  Do not eat food or drink liquids after midnight.  Take these medicines the morning of surgery with A SIP OF WATER hydrocodone or tramdol, claritin, mobic, metoprolol, benicar, prilosec. Take your flonase before you come. Only take 1/2 of your usual night time Insulin  Dose the night before your surgery. DO NOT take any medicines for diabetes the morning of your surgery.   Do not wear jewelry, make-up or nail polish.  Do not wear lotions, powders, or perfumes, or deoderant.  Do not shave 48 hours prior to surgery.  Men may shave face and neck.  Do not bring valuables to the hospital.  [facility] is not responsible for any belongings or valuables.  Contacts, dentures or bridgework may not be worn into surgery.  Leave your suitcase in the car.  After surgery it may be brought to your room.  For patients admitted to the hospital, discharge time will be determined by your treatment team.  Patients discharged the day of surgery will not be allowed to drive home.   Name and phone number of your driver:   Wife- [name]. Special instructions:  May go online at [email or web address] CODE- [name]. To view more information about a total knee replacement.  Please read over the following fact sheets that you were given. Coughing and Deep Breathing, Blood Transfusion Information, Total Joint Packet, Surgical Site Infection Prevention, Anesthesia Post-op Instructions and Care and Recovery After Surgery       Total Knee Replacement  Total knee replacement is a surgery to replace your damaged knee joint. Your knee joint is replaced with a man-made (artificial) knee joint. The man-made knee joint is called a prosthesis. This surgery is done to lessen pain and improve  movement. BEFORE THE PROCEDURE   Do not eat or drink anything after midnight on the night before the procedure or as told by your doctor.  Ask your doctor about:  Changing or stopping your normal medicines. This is important if you take diabetes medicines or blood thinners.  Taking aspirin or ibuprofen medicines. These thin your blood. Do not take these medicines if your doctor tells you not to.  Plan to have someone take you home after the procedure.  Ask your health care team how your surgery site will be marked.  You may be given medicines that kill germs (antibiotics) to help prevent infection. PROCEDURE   To help prevent infection:  Your health care team will wash or sanitize their hands.  Your skin will be washed with soap.  An IV tube will be put into one of your veins.  You will be given one or more of the following:  Sedative. This is a medicine that makes you relaxed.  General anesthetic. This is a medicine that makes you fall asleep.  Spinal anesthetic. This is a medicine that numbs your body below the waist.  Nerve block. This is a medicine to block feeling in your leg. This medicine helps to ease pain after surgery.  A cut (incision) will be made in the front of your knee. Your surgeon will take out any damaged parts of your knee joint.  Your surgeon will then:  Put new metal liner over  the part of the thigh bone that is taken out.  Put a plastic liner over the shin bone.  Your surgeon may put a plastic piece over the surface of your knee cap.  The cut will be closed. A bandage will be placed over it. AFTER THE PROCEDURE   You will stay in a recovery area until your medicines wear off.  You may have tubes to drain fluid from your knee.  Once you are doing okay, you will be taken to your hospital room.  You may be told to take actions to help prevent blood clots. These may include:  Walking soon after surgery with someone helping you. Moving around  helps to improve blood flow.  Taking medicines to thin your blood (anticoagulants).  Wearing special socks (compression stockings) or using other types of devices.  You may need to do exercise therapy (physical therapy) until you are doing well. Your doctor will tell you when you are well enough to go home.   This information is not intended to replace advice given to you by your health care provider. Make sure you discuss any questions you have with your health care provider.   Document Released: 02/21/2012 Document Revised: 08/20/2015 Document Reviewed: 02/21/2012 Elsevier Interactive Patient Education 2016 [place]. Total Knee Replacement, Care After These instructions give you information on caring for yourself after your procedure. Your doctor also may give you specific instructions. Call your doctor if you have any problems or questions after your procedure. HOME CARE  See a physical therapist as told by your doctor.  Do not take baths, swim, or use hot tubs until your doctor says that it is okay.  Take medicines as told by your doctor.  Do not lift or drive until your doctor says it is okay.  Use crutches, a walker, or a cane as told by your doctor.  If you were given a knee joint motion machine, use it as told.  Rest often, but move around as much as you can. Movement helps you to heal and helps to prevent problems.  Wear special socks (compression stockings) as told by your doctor. These socks help to prevent blood clots and lessen swelling in your legs.  Follow instructions from your doctor about how to take care of your cut from surgery (incision). Make sure you:  Wash your hands with soap and water before you change your bandage (dressing). If you cannot use soap and water, use hand sanitizer.  Change your bandage as told by your doctor.  Leave stitches (sutures), skin glue, or skin tape (adhesive) strips in place. These may need to stay in place for 2 weeks or  longer. If the tape strips get loose and curl up, you may trim the loose edges. Do not remove the strips completely unless your doctor says it is okay. GET HELP IF:  You have trouble breathing.  Your wound is red, puffy, or is getting more painful.  You have yellowish-white fluid (pus) coming from your wound.  You have a bad smell coming from your wound.  Your wound will not stop bleeding.  Your wound opens up after sutures (stitches) or staples are removed.  You have a fever. GET HELP RIGHT AWAY IF:   You have a rash.  You have shortness of breath or chest pain.  You have pain or puffiness (swelling) in your calf or thigh.  Your ability to move your knee is decreasing rather than increasing.  Your knee pain is increasing  daily.   This information is not intended to replace advice given to you by your health care provider. Make sure you discuss any questions you have with your health care provider.   Document Released: 02/21/2012 Document Revised: 08/20/2015 Document Reviewed: 02/21/2012 Elsevier Interactive Patient Education 2016 [employer] Inc[employer]   PATIENT INSTRUCTIONS POST-ANESTHESIA  IMMEDIATELY FOLLOWING SURGERY:  Do not drive or operate machinery for the first twenty four hours after surgery.  Do not make any important decisions for twenty four hours after surgery or while taking narcotic pain medications or sedatives.  If you develop intractable nausea and vomiting or a severe headache please notify your doctor immediately.  FOLLOW-UP:  Please make an appointment with your surgeon as instructed. You do not need to follow up with anesthesia unless specifically instructed to do so.  WOUND CARE INSTRUCTIONS (if applicable):  Keep a dry clean dressing on the anesthesia/puncture wound site if there is drainage.  Once the wound has quit draining you may leave it open to air.  Generally you should leave the bandage intact for twenty four hours unless there is drainage.  If the  epidural site drains for more than 36-48 hours please call the anesthesia department.  QUESTIONS?:  Please feel free to call your physician or the hospital operator if you have any questions, and they will be happy to assist you.

## 2016-09-13 ENCOUNTER — Encounter (HOSPITAL_COMMUNITY): Payer: Self-pay

## 2016-09-13 ENCOUNTER — Telehealth: Payer: Self-pay | Admitting: Orthopedic Surgery

## 2016-09-13 ENCOUNTER — Encounter (HOSPITAL_COMMUNITY)
Admission: RE | Admit: 2016-09-13 | Discharge: 2016-09-13 | Disposition: A | Discharge status: Home / Self Care | Payer: Federal, State, Local not specified - PPO | Source: Ambulatory Visit | Attending: Orthopedic Surgery | Admitting: Orthopedic Surgery

## 2016-09-13 NOTE — Telephone Encounter (Signed)
Noted, surgery scheduling notified

## 2016-09-13 NOTE — Telephone Encounter (Signed)
Patient left msg on voicemail saying his endocrinologist has advised him to postpone his surgery due to his blood sugar levels. He has been advised to wait until his blood sugar is under control.

## 2016-09-16 ENCOUNTER — Inpatient Hospital Stay (HOSPITAL_COMMUNITY)
Admission: RE | Admit: 2016-09-16 | Payer: Federal, State, Local not specified - PPO | Source: Ambulatory Visit | Admitting: Orthopedic Surgery

## 2016-09-16 ENCOUNTER — Encounter (HOSPITAL_COMMUNITY): Admission: RE | Payer: Self-pay | Source: Ambulatory Visit

## 2016-09-16 SURGERY — ARTHROPLASTY, KNEE, TOTAL
Anesthesia: Spinal | Laterality: Left

## 2016-09-20 ENCOUNTER — Other Ambulatory Visit: Payer: Self-pay | Admitting: Adult Health

## 2016-09-24 ENCOUNTER — Ambulatory Visit: Payer: Federal, State, Local not specified - PPO | Admitting: Orthopedic Surgery

## 2016-09-28 ENCOUNTER — Ambulatory Visit: Payer: Federal, State, Local not specified - PPO | Admitting: Orthopedic Surgery

## 2016-10-21 DIAGNOSIS — M1991 Primary osteoarthritis, unspecified site: Secondary | ICD-10-CM | POA: Diagnosis not present

## 2016-10-21 DIAGNOSIS — L03032 Cellulitis of left toe: Secondary | ICD-10-CM | POA: Diagnosis not present

## 2016-10-21 DIAGNOSIS — E1129 Type 2 diabetes mellitus with other diabetic kidney complication: Secondary | ICD-10-CM | POA: Diagnosis not present

## 2016-10-21 DIAGNOSIS — Z6839 Body mass index (BMI) 39.0-39.9, adult: Secondary | ICD-10-CM | POA: Diagnosis not present

## 2016-10-21 DIAGNOSIS — N182 Chronic kidney disease, stage 2 (mild): Secondary | ICD-10-CM | POA: Diagnosis not present

## 2016-10-21 DIAGNOSIS — Z1389 Encounter for screening for other disorder: Secondary | ICD-10-CM | POA: Diagnosis not present

## 2016-11-18 DIAGNOSIS — Z1389 Encounter for screening for other disorder: Secondary | ICD-10-CM | POA: Diagnosis not present

## 2016-11-18 DIAGNOSIS — L600 Ingrowing nail: Secondary | ICD-10-CM | POA: Diagnosis not present

## 2016-11-18 DIAGNOSIS — Z6839 Body mass index (BMI) 39.0-39.9, adult: Secondary | ICD-10-CM | POA: Diagnosis not present

## 2016-11-24 DIAGNOSIS — M79675 Pain in left toe(s): Secondary | ICD-10-CM | POA: Diagnosis not present

## 2016-11-24 DIAGNOSIS — L600 Ingrowing nail: Secondary | ICD-10-CM | POA: Diagnosis not present

## 2016-11-24 DIAGNOSIS — L03032 Cellulitis of left toe: Secondary | ICD-10-CM | POA: Diagnosis not present

## 2016-11-24 DIAGNOSIS — M79672 Pain in left foot: Secondary | ICD-10-CM | POA: Diagnosis not present

## 2016-12-08 ENCOUNTER — Other Ambulatory Visit: Payer: Self-pay | Admitting: Adult Health

## 2016-12-15 DIAGNOSIS — L600 Ingrowing nail: Secondary | ICD-10-CM | POA: Diagnosis not present

## 2016-12-15 DIAGNOSIS — L03032 Cellulitis of left toe: Secondary | ICD-10-CM | POA: Diagnosis not present

## 2016-12-15 DIAGNOSIS — M79672 Pain in left foot: Secondary | ICD-10-CM | POA: Diagnosis not present

## 2016-12-15 DIAGNOSIS — M79675 Pain in left toe(s): Secondary | ICD-10-CM | POA: Diagnosis not present

## 2016-12-15 DIAGNOSIS — K080 Exfoliation of teeth due to systemic causes: Secondary | ICD-10-CM | POA: Diagnosis not present

## 2016-12-27 DIAGNOSIS — K080 Exfoliation of teeth due to systemic causes: Secondary | ICD-10-CM | POA: Diagnosis not present

## 2017-01-20 DIAGNOSIS — Z1389 Encounter for screening for other disorder: Secondary | ICD-10-CM | POA: Diagnosis not present

## 2017-01-20 DIAGNOSIS — Z6838 Body mass index (BMI) 38.0-38.9, adult: Secondary | ICD-10-CM | POA: Diagnosis not present

## 2017-01-20 DIAGNOSIS — J111 Influenza due to unidentified influenza virus with other respiratory manifestations: Secondary | ICD-10-CM | POA: Diagnosis not present

## 2017-02-01 ENCOUNTER — Ambulatory Visit (INDEPENDENT_AMBULATORY_CARE_PROVIDER_SITE_OTHER): Payer: Federal, State, Local not specified - PPO | Admitting: Orthopedic Surgery

## 2017-02-01 DIAGNOSIS — M170 Bilateral primary osteoarthritis of knee: Secondary | ICD-10-CM | POA: Diagnosis not present

## 2017-02-01 DIAGNOSIS — M25562 Pain in left knee: Secondary | ICD-10-CM | POA: Diagnosis not present

## 2017-02-01 DIAGNOSIS — G8929 Other chronic pain: Secondary | ICD-10-CM

## 2017-02-01 DIAGNOSIS — M25561 Pain in right knee: Secondary | ICD-10-CM

## 2017-02-01 MED ORDER — TRAMADOL HCL 50 MG PO TABS: 50.0000 mg | ORAL_TABLET | Freq: Four times a day (QID) | ORAL | 5 refills | Status: DC | PRN

## 2017-02-01 NOTE — Progress Notes (Signed)
Patient ID: [name], male   DOB: [birth year], 56 y.o.   MRN: [id number]  Chief Complaint  Patient presents with  . Follow-up    DISCUSS LEFT KNEE REPLACEMENT    HPI [name] is a 56 y.o. male.   Left knee pain over 1 year  Dull  Medial left knee Swelling  Pain unrelieved by Anti-inflammatory medication, mild pain relief with tramadol. Patient has reached end-stage of discomfort and disability with his knee and would like to have left total knee replacement  1.5 years ago he had coronary artery bypass grafting  Review of Systems Review of Systems  Constitutional: Negative for fatigue and fever.  Respiratory: Negative for shortness of breath.   Cardiovascular: Negative for chest pain.   (2 MINIMUM)  Past Medical History:  Diagnosis Date  . Arthritis   . CAD (coronary artery disease)    Multivessel disease status post CABG 08/2015  . Essential hypertension   . Hyperlipidemia   . Hypertension   . Type 2 diabetes mellitus ([place])     Past Surgical History:  Procedure Laterality Date  . APPENDECTOMY    . Biceps tendon surgery Right   . CARDIAC CATHETERIZATION N/A 08/25/2015   Procedure: Left Heart Cath and Coronary Angiography;  Surgeon: Belva Crome, MD;  Location: [facility];  Service: Cardiovascular;  Laterality: N/A;  . CORONARY ARTERY BYPASS GRAFT N/A 08/29/2015   Procedure: CORONARY ARTERY BYPASS GRAFTING (CABG);  Surgeon: [name], MD;  Location: [facility];  Service: Open Heart Surgery;  Laterality: N/A;  . KNEE ARTHROSCOPY Left   . TEE WITHOUT CARDIOVERSION N/A 08/29/2015   Procedure: TRANSESOPHAGEAL ECHOCARDIOGRAM (TEE);  Surgeon: [name], MD;  Location: [facility];  Service: Open Heart Surgery;  Laterality: N/A;    Social History Social History  Substance Use Topics  . Smoking status: Never Smoker  . Smokeless tobacco: Not on file  . Alcohol use No    No Known Allergies  No outpatient prescriptions have been  marked as taking for the 02/01/17 encounter (Appointment) with Carole Civil, MD.      Physical Exam Physical Exam 1.There were no vitals taken for this visit.  2. Gen. appearance. The patient is well-developed and well-nourished, grooming and hygiene are normal. There are no gross congenital abnormalities  3. The patient is alert and oriented to person place and time  4. Mood and affect are normal  5. Ambulation left knee limp  Examination reveals the following: 6. On inspection we find varus knee with medial joint line tenderness small effusion  7. With the range of motion of  10 flexion contracture and 110 knee flexion  8. Stability tests were normal    9. Strength tests revealed grade 5 motor strength  10. Skin we find no rash ulceration or erythema darkening of the skin secondary to chronic venous stasis  11. Sensation remains intact  12 Vascular system shows mild peripheral edema  Right knee 5 flexion contracture flexion 120  MEDICAL DECISION MAKING:    Data Reviewed Plain film shows varus alignment of the left knee bone-on-bone position of the medial compartment  Assessment Severe arthritis left knee  Plan Plan for surgery for left total knee  This procedure has been fully reviewed with the patient and written informed consent has been obtained. Meds ordered this encounter  Medications  . azithromycin (ZITHROMAX) 250 MG tablet    Sig: Take by mouth daily.  . TURMERIC CURCUMIN PO  Sig: Take by mouth.  . Garlic 2257 MG CAPS    Sig: Take by mouth.  . metoprolol tartrate (LOPRESSOR) 25 MG tablet    Sig: Take 12.5 mg by mouth 2 (two) times daily.  . traMADol (ULTRAM) 50 MG tablet    Sig: Take 1 tablet (50 mg total) by mouth every 6 (six) hours as needed.    Dispense:  60 tablet    Refill:  5     Arther Abbott 02/01/2017, 1:56 PM

## 2017-02-01 NOTE — Patient Instructions (Signed)
You have decided to proceed with knee replacement surgery. You have decided not to continue with nonoperative measures such as but not limited to oral medication, weight loss, activity modification, physical therapy, bracing, or injection.  We will perform the procedure commonly known as total knee replacement. Some of the risks associated with knee replacement surgery include but are not limited to Bleeding Infection Swelling Stiffness Blood clot Pain that persists even after surgery  Infection is especially devastating complication of knee surgery although rare. If infection does occur your implant will usually have to be removed and several surgeries and antibiotics will be needed to eradicate the infection prior to performing a repeat replacement.   In some cases amputation is required to eradicate the infection. In other rare cases a knee fusion is needed    If you're not comfortable with these risks and would like to continue with nonoperative treatment please let Dr. Aline Brochure know prior to your surgery.

## 2017-02-07 ENCOUNTER — Inpatient Hospital Stay (HOSPITAL_COMMUNITY): Admission: RE | Admit: 2017-02-07 | Payer: Federal, State, Local not specified - PPO | Source: Ambulatory Visit

## 2017-02-09 DIAGNOSIS — E1129 Type 2 diabetes mellitus with other diabetic kidney complication: Secondary | ICD-10-CM | POA: Diagnosis not present

## 2017-02-09 DIAGNOSIS — J069 Acute upper respiratory infection, unspecified: Secondary | ICD-10-CM | POA: Diagnosis not present

## 2017-02-09 DIAGNOSIS — B349 Viral infection, unspecified: Secondary | ICD-10-CM | POA: Diagnosis not present

## 2017-02-09 DIAGNOSIS — Z6838 Body mass index (BMI) 38.0-38.9, adult: Secondary | ICD-10-CM | POA: Diagnosis not present

## 2017-02-09 DIAGNOSIS — Z1389 Encounter for screening for other disorder: Secondary | ICD-10-CM | POA: Diagnosis not present

## 2017-02-09 DIAGNOSIS — R05 Cough: Secondary | ICD-10-CM | POA: Diagnosis not present

## 2017-02-14 ENCOUNTER — Other Ambulatory Visit: Payer: Self-pay | Admitting: *Deleted

## 2017-02-14 DIAGNOSIS — Z96652 Presence of left artificial knee joint: Secondary | ICD-10-CM

## 2017-02-14 NOTE — Progress Notes (Signed)
Per [employer] previous request for pre authorization for Cpt [id number] was good for 6 months. No new auth required. Date of service updated.  Auth number stays the same.   [employer] AUTHORIZED INPATIENT SURGERY SCHEDULED FOR 09/16/16 AUTH [id number]

## 2017-03-01 ENCOUNTER — Encounter: Payer: Self-pay | Admitting: Neurology

## 2017-03-01 NOTE — Patient Instructions (Signed)
[name]  03/01/2017     @[email or web address] @   Your procedure is scheduled on  03/09/2017   Report to [facility] at  700  A.M.  Call this number if you have problems the morning of surgery:  [phone]   Remember:  Do not eat food or drink liquids after midnight.  Take these medicines the morning of surgery with A SIP OF WATER  Mobic or ultram, metoprolol, benicar, prilosec.   Do not wear jewelry, make-up or nail polish.  Do not wear lotions, powders, or perfumes, or deoderant.  Do not shave 48 hours prior to surgery.  Men may shave face and neck.  Do not bring valuables to the hospital.  [facility] is not responsible for any belongings or valuables.  Contacts, dentures or bridgework may not be worn into surgery.  Leave your suitcase in the car.  After surgery it may be brought to your room.  For patients admitted to the hospital, discharge time will be determined by your treatment team.  Patients discharged the day of surgery will not be allowed to drive home.   Name and phone number of your driver:   wife Special instructions:  None  Please read over the following fact sheets that you were given. Pain Booklet, Coughing and Deep Breathing, Blood Transfusion Information, Lab Information, Total Joint Packet, MRSA Information, Surgical Site Infection Prevention, Anesthesia Post-op Instructions and Care and Recovery After Surgery       Total Knee Replacement Total knee replacement is a procedure to replace the knee joint with an artificial (prosthetic) knee joint. The purpose of this surgery is to reduce knee pain and improve knee function. The prosthetic knee joint (prosthesis) is usually made of metal and plastic. It replaces parts of the thigh bone (femur), lower leg bone (tibia), and kneecap (patella) that are removed during the procedure. Tell a health care provider about:  Any allergies you have.  All medicines you are taking, including  vitamins, herbs, eye drops, creams, and over-the-counter medicines.  Any problems you or family members have had with anesthetic medicines.  Any blood disorders you have.  Any surgeries you have had.  Any medical conditions you have.  Whether you are pregnant or may be pregnant. What are the risks? Generally, this is a safe procedure. However, problems may occur, including:  Infection.  Bleeding.  Allergic reactions to medicines.  Damage to other structures or organs.  Decreased range of motion of the knee.  Instability of the knee.  Loosening of the prosthetic joint.  Knee pain that does not go away (chronic pain). What happens before the procedure?  Ask your health care provider about:  Changing or stopping your regular medicines. This is especially important if you are taking diabetes medicines or blood thinners.  Taking medicines such as aspirin and ibuprofen. These medicines can thin your blood. Do not take these medicines before your procedure if your health care provider instructs you not to.  Have dental care and routine cleanings completed before your procedure. Plan to not have dental work done for 3 months after your procedure. Germs from anywhere in your body, including your mouth, can travel to your new joint and infect it.  Follow instructions from your health care provider about eating or drinking restrictions.  Ask your health care provider how your surgical site will be marked or identified.  You may be given antibiotic medicine to help prevent infection.  If  your health care provider prescribes physical therapy, do exercises as instructed.  Do not use any tobacco products, such as cigarettes, chewing tobacco, or e-cigarettes. If you need help quitting, ask your health care provider.  You may have a physical exam.  You may have tests, such as:  X-rays.  MRI.  CT scan.  Bone scans.  You may have a blood or urine sample taken.  Plan to  have someone take you home after the procedure.  If you will be going home right after the procedure, plan to have someone with you for at least 24 hours. It is recommended that you have someone to help care for you for at least 4-6 weeks after your procedure. What happens during the procedure?  To reduce your risk of infection:  Your health care team will wash or sanitize their hands.  Your skin will be washed with soap.  An IV tube will be inserted into one of your veins.  You will be given one or more of the following:  A medicine to help you relax (sedative).  A medicine to numb the area (local anesthetic).  A medicine to make you fall asleep (general anesthetic).  A medicine that is injected into your spine to numb the area below and slightly above the injection site (spinal anesthetic).  A medicine that is injected into an area of your body to numb everything below the injection site (regional anesthetic).  An incision will be made in your knee.  Damaged cartilage and bone will be removed from your femur, tibia, and patella.  Parts of the prosthesis (liners)will be placed over the areas of bone and cartilage that were removed. A metal liner will be placed over your femur, and plastic liners will be placed over your tibia and the underside of your patella.  One or more small tubes (drains) may be placed near your incision to help drain extra fluid from your surgical site.  Your incision will be closed with stitches (sutures), skin glue, or adhesive strips. Medicine may be applied to your incision.  A bandage (dressing) will be placed over your incision. The procedure may vary among health care providers and hospitals. What happens after the procedure?  Your blood pressure, heart rate, breathing rate, and blood oxygen level will be monitored often until the medicines you were given have worn off.  You may continue to receive fluids and medicines through an IV  tube.  You will have some pain. Pain medicines will be available to help you.  You may have fluid coming from one or more drains in your incision.  You may have to wear compression stockings. These stockings help to prevent blood clots and reduce swelling in your legs.  You will be encouraged to move around as much as possible.  You may be given a continuous passive motion machine to use at home. You will be shown how to use this machine.  Do not drive for 24 hours if you received a sedative. This information is not intended to replace advice given to you by your health care provider. Make sure you discuss any questions you have with your health care provider. Document Released: 03/07/2001 Document Revised: 08/02/2016 Document Reviewed: 11/05/2015 Elsevier Interactive Patient Education  2017 [place].  Total Knee Replacement, Care After These instructions give you information about caring for yourself after your procedure. Your doctor may also give you more specific instructions. Call your doctor if you have any problems or questions after your  procedure. Follow these instructions at home: Medicines   Take over-the-counter and prescription medicines only as told by your doctor.  If you were prescribed an antibiotic medicine, take it as told by your doctor. Do not stop taking the antibiotic even if you start to feel better.  If you were prescribed a blood thinner (anticoagulant), take it as told by your doctor. If you have a splint or brace:   Wear the splint or brace as told by your doctor. Remove it only as told by your doctor.  Loosen the splint or brace if your toes tingle, get numb, or turn cold and blue.  Do not let your splint or brace get wet if it is not waterproof.  Keep the splint or brace clean. Bathing    Do not take baths, swim, or use a hot tub until your doctor says it is okay. Ask your doctor if you can take showers. You may only be allowed to take sponge  baths for bathing.  If you have a splint or brace that is not waterproof, cover it with a watertight covering when you take a bath or a shower.  Keep your bandage (dressing) dry until your doctor says it can be taken off. Incision care and drain care   Check your cut from surgery (incision) and your drain every day for signs of infection. Check for:  More redness, swelling, or pain.  More fluid or blood.  Warmth.  Pus or a bad smell.  Follow instructions from your doctor about how to take care of your cut from surgery. Make sure you:  Wash your hands with soap and water before you change your bandage. If you cannot use soap and water, use hand sanitizer.  Change your bandage as told by your doctor.  Leave stitches (sutures), skin glue, or skin tape (adhesive) strips in place. They may need to stay in place for 2 weeks or longer. If tape strips get loose and curl up, you may trim the loose edges. Do not remove tape strips completely unless your doctor says it is okay.  If you have a drain, follow instructions from your doctor about caring for it. Do not remove the drain tube or any bandages unless your doctor says it is okay. Managing pain, stiffness, and swelling    If directed, put ice on your knee.  Put ice in a plastic bag.  Place a towel between your skin and the bag.  Leave the ice on for 20 minutes, 2-3 times per day.  If directed, apply heat to the affected area as often as told by your doctor. Use the heat source that your doctor recommends, such as a moist heat pack or a heating pad.  Place a towel between your skin and the heat source.  Leave the heat on for 20-30 minutes.  Remove the heat if your skin turns bright red. This is especially important if you are unable to feel pain, heat, or cold. You may have a greater risk of getting burned.  Move your toes often to avoid stiffness and to lessen swelling.  Raise (elevate) your knee above the level of your heart  while you are sitting or lying down.  Wear elastic knee support for as long as told by your doctor. Driving    Do not drive until your doctor says it is okay. Ask your doctor when it is safe to drive if you have a splint or brace on your knee.  Do not drive  or use heavy machinery while taking prescription pain medicine.  Do not drive for 24 hours if you received a sedative. Activity   Do not lift anything that is heavier than 10 lb (4.5 kg) until your doctor says it is okay.  Do not play contact sports until your doctor says it is okay.  Avoid high-impact activities, including running, jumping rope, and jumping jacks.  Avoid sitting for a long time without moving. Get up and move around at least every few hours.  If physical therapy was prescribed, do exercises as told by your doctor.  Return to your normal activities as told by your doctor. Ask your doctor what activities are safe for you. Safety   Do not use your leg to support your body weight until your doctor says that you can. Use crutches or a walker as told by your doctor. General instructions    Do not have any dental work done for at least 3 months after your surgery. When you do have dental work done, tell your dentist about your joint replacement.  Do not use any tobacco products, such as cigarettes, chewing tobacco, or e-cigarettes. If you need help quitting, ask your doctor.  Wear special socks (compression stockings) as told by your doctor.  If you have been sent home with a knee joint motion machine (continuous passive motion machine), use it as told by your doctor.  Drink enough fluid to keep your pee (urine) clear or pale yellow.  If you have been told to lose weight, follow instructions from your doctor about how to do this safely.  Keep all follow-up visits as told by your doctor. This is important. Contact a doctor if:  You have more redness, swelling, or pain around your cut from surgery or your  drain.  You have more fluid or blood coming from your cut from surgery or your drain.  Your cut from surgery or your drain area feels warm to the touch.  You have pus or a bad smell coming from your cut from surgery or your drain.  You have a fever.  Your cut breaks open after your doctor removes your stitches, skin glue, or skin tape strips.  Your new joint feels loose.  You have knee pain that does not go away. Get help right away if:  You have a rash.  You have pain in your calf or thigh.  You have swelling in your calf or thigh.  You have shortness of breath.  You have trouble breathing.  You have chest pain.  Your ability to move your knee is getting worse. This information is not intended to replace advice given to you by your health care provider. Make sure you discuss any questions you have with your health care provider. Document Released: 02/21/2012 Document Revised: 08/02/2016 Document Reviewed: 11/05/2015 Elsevier Interactive Patient Education  2017 [employer] PATIENT INSTRUCTIONS POST-ANESTHESIA  IMMEDIATELY FOLLOWING SURGERY:  Do not drive or operate machinery for the first twenty four hours after surgery.  Do not make any important decisions for twenty four hours after surgery or while taking narcotic pain medications or sedatives.  If you develop intractable nausea and vomiting or a severe headache please notify your doctor immediately.  FOLLOW-UP:  Please make an appointment with your surgeon as instructed. You do not need to follow up with anesthesia unless specifically instructed to do so.  WOUND CARE INSTRUCTIONS (if applicable):  Keep a dry clean dressing on the anesthesia/puncture wound site if there is drainage.  Once the wound has quit draining you may leave it open to air.  Generally you should leave the bandage intact for twenty four hours unless there is drainage.  If the epidural site drains for more than 36-48 hours please call the anesthesia  department.  QUESTIONS?:  Please feel free to call your physician or the hospital operator if you have any questions, and they will be happy to assist you.

## 2017-03-02 ENCOUNTER — Ambulatory Visit (INDEPENDENT_AMBULATORY_CARE_PROVIDER_SITE_OTHER): Payer: Federal, State, Local not specified - PPO | Admitting: Neurology

## 2017-03-02 ENCOUNTER — Encounter: Payer: Self-pay | Admitting: Neurology

## 2017-03-02 VITALS — BP 127/78 | HR 97 | Resp 20 | Ht 70.0 in | Wt 259.0 lb

## 2017-03-02 DIAGNOSIS — Z87898 Personal history of other specified conditions: Secondary | ICD-10-CM | POA: Diagnosis not present

## 2017-03-02 DIAGNOSIS — G4733 Obstructive sleep apnea (adult) (pediatric): Secondary | ICD-10-CM | POA: Diagnosis not present

## 2017-03-02 DIAGNOSIS — E669 Obesity, unspecified: Secondary | ICD-10-CM | POA: Diagnosis not present

## 2017-03-02 DIAGNOSIS — Z9989 Dependence on other enabling machines and devices: Secondary | ICD-10-CM

## 2017-03-02 NOTE — Progress Notes (Signed)
SLEEP MEDICINE CLINIC   Provider:  Larey Seat, M D  Referring Provider: [name], MD Primary Care Physician:  Purvis Kilts, MD  Chief Complaint  Patient presents with  . Follow-up    cpap    HPI:  [name] is a 56 y.o. male , seen here as a referral from Dr. Hilma Favors for a sleep evaluation.      [name] states that he is here because his physician and his spouse are concerned about his sleep apnea. About 7 weeks ago he had presented with chest tightness and was told that he had significant coronary artery disease. Some of his vessels were 95% occluded. He underwent bypass surgery on 08-29-15, and it was in the hospital that he was observed having apnea by his nurses as well as by his spouse. I would like to allude that the patient has other risk factors such as diabetes, hypertension, hypercholesterolemia and that he used to be morbidly obese but lost about 80 pounds within the last 12 months. The patient acknowledges that he feels fatigued and daytime sleepy also this varies day by day. Often his sleep is not refreshing nonrestorative. His wife has noticed him to snore as well as witnessed apneas. Sleep habits are as follows:The patient retreats usually between 9 and 10 at night to the bedroom, he is promptly asleep. The bedroom is described as core, quiet and dark. He shares the bedroom in bed with his wife. His wife has not mentioned any restlessness kicking or thrashing. He prefers to sleep on his side and states that he cannot sleep on his back. He usually wakes up every 2 hours or so goes to the bathroom. 2-3 times at night. He goes promptly to sleep again. He rises in the morning at 6 AM and wakes spontaneously without alarm. He has not noticed a dry mouth in the morning, he may sometimes have a morning headache. He reports no numbness, no jaw pain. He has never been woken up by headaches from sleep. He reports that he dreams also his dreams are not acted  out upon, nor are extremely vivid or Water quality scientist. When he wakes up he is often on his back and not on his side. Sleep medical history and family sleep history: [name] father suffered from coronary artery disease and died of multiorgan failure. [name] younger brother also has undergone bypass surgery for coronary artery disease. His brother also has been diagnosed with obstructive sleep apnea. Social history: married, disabled due to heart disease. He does not use tobacco products and does not drink alcohol, he drinks mainly water and has 1 or 2 coffees in the morning. No soda or iced tea.   Interval history from 03/02/2016  We are meeting today to follow-up on a recent sleep study;  the patient underwent polysomnography on 11/30/2015 and was diagnosed with mild to moderate apnea at an AHI of 16.5, REM AHI of 30.7, supine AHI of 90.6. He also had the oxygen nadir as low as 79% saturation was 30 minutes of desaturation time.  For this reason I suggested strongly to use CPAP rather than a dental device. The CPAP titration followed on 12/30/2015 and reduced the AHI to 1.3 the patient did best at 9 cm water pressure was 17 m EPR I recommended an air-fit P 10 in medium size with heated humidity. The patient's compliance download today shows 100% compliance for the last 30 days 97% compliance for use over 4  hours average user time is 7 hours 31 minutes and the patient has an AHI of 0.6 at 9 cm water pressure. This is an almost complete alleviation there is no significant air leak and he has been using the same unit humidity settings for the last month. He endorsed today the Epworth sleepiness score at 4 points and the fatigue severity score at 17 points.  Interval history from 03/02/2017, the patient continues to use CPAP at 100% compliance with an average of 6 hours and 38 minutes of nightly use, with a residual AHI of 0.4 at a setting of 9 cm water pressure. 100% compliant by  time and number of days -his machine should be WiFi compatible more actual data should be obtained.  He assured me that he much more alert, able to multitask, concentrated and that his Epworth sleepiness score is 3 points, his fatigue severity score is 12 points. He lost 30 pounds since last year, he reported.      Review of Systems: Out of a complete 14 system review, the patient complains of only the following symptoms, and all other reviewed systems are negative. See above. Knee pain, limited mobility.     Social History   Social History  . Marital status: Married    Spouse name: N/A  . Number of children: N/A  . Years of education: college   Occupational History  . Maintenance tech [employer]   Social History Main Topics  . Smoking status: Never Smoker  . Smokeless tobacco: Never Used  . Alcohol use No  . Drug use: No  . Sexual activity: Not on file   Other Topics Concern  . Not on file   Social History Narrative  . No narrative on file    Family History  Problem Relation Age of Onset  . Arthritis    . Cancer    . Diabetes    . CAD Father   . Diabetes Mellitus II Father   . CAD Brother   . Diabetes Mellitus II Brother   . Anesthesia problems Neg Hx   . Hypotension Neg Hx   . Malignant hyperthermia Neg Hx   . Pseudochol deficiency Neg Hx     Past Medical History:  Diagnosis Date  . Arthritis   . CAD (coronary artery disease)    Multivessel disease status post CABG 08/2015  . Essential hypertension   . Hyperlipidemia   . Hypertension   . Type 2 diabetes mellitus ([place])     Past Surgical History:  Procedure Laterality Date  . APPENDECTOMY    . Biceps tendon surgery Right   . CARDIAC CATHETERIZATION N/A 08/25/2015   Procedure: Left Heart Cath and Coronary Angiography;  Surgeon: Belva Crome, MD;  Location: [place] [facility];  Service: Cardiovascular;  Laterality: N/A;  . CORONARY ARTERY BYPASS GRAFT N/A 08/29/2015   Procedure: CORONARY ARTERY  BYPASS GRAFTING (CABG);  Surgeon: [name], MD;  Location: [place];  Service: Open Heart Surgery;  Laterality: N/A;  . KNEE ARTHROSCOPY Left   . TEE WITHOUT CARDIOVERSION N/A 08/29/2015   Procedure: TRANSESOPHAGEAL ECHOCARDIOGRAM (TEE);  Surgeon: [name], MD;  Location: [place];  Service: Open Heart Surgery;  Laterality: N/A;    Current Outpatient Prescriptions  Medication Sig Dispense Refill  . aspirin EC 325 MG EC tablet Take 1 tablet (325 mg total) by mouth daily. 30 tablet 0  . azithromycin (ZITHROMAX) 250 MG tablet Take 250-500 mg by mouth daily. 500 mg the  1st day and 250 mg daily until completed.  Has 3 days left to take should be done by 02-04-17    . furosemide (LASIX) 20 MG tablet Take 20 mg by mouth daily.     . Garlic 7829 MG CAPS Take 1,000 mg by mouth daily.     . hydrochlorothiazide (HYDRODIURIL) 25 MG tablet 12.5 mg  twice daily  0  . insulin lispro protamine-lispro (HUMALOG 75/25 MIX) (75-25) 100 UNIT/ML SUSP injection Inject 70 Units into the skin 2 (two) times daily with a meal.     . KOMBIGLYZE XR 2.04-999 MG TB24 Take 1 tablet by mouth 2 (two) times daily.     . meloxicam (MOBIC) 15 MG tablet Take 15 mg by mouth daily.    . metoprolol tartrate (LOPRESSOR) 25 MG tablet Take 12.5 mg by mouth 2 (two) times daily.    [name] olmesartan (BENICAR) 40 MG tablet Take 1 tablet (40 mg total) by mouth daily. 90 tablet 3  . omeprazole (PRILOSEC) 20 MG capsule Take 1 capsule (20 mg total) by mouth daily. 30 capsule 0  . omeprazole (PRILOSEC) 40 MG capsule Take 40 mg by mouth daily.     . pravastatin (PRAVACHOL) 20 MG tablet TAKE 1 TABLET(20 MG) BY MOUTH AT BEDTIME 90 tablet 3  . traMADol (ULTRAM) 50 MG tablet Take 1 tablet (50 mg total) by mouth every 6 (six) hours as needed. (Patient taking differently: Take 50 mg by mouth every 6 (six) hours as needed for moderate pain. ) 60 tablet 5  . TURMERIC CURCUMIN PO Take 1 tablet by mouth daily.      No current  facility-administered medications for this visit.     Allergies as of 03/02/2017  . (No Known Allergies)    Vitals: BP 127/78   Pulse 97   Resp 20   Ht 5\' 10"  (1.778 m)   Wt 259 lb (117.5 kg)   BMI 37.16 kg/m  Last Weight:  Wt Readings from Last 1 Encounters:  03/02/17 259 lb (117.5 kg)   FAO:ZHYQ mass index is 37.16 kg/m.     Last Height:   Ht Readings from Last 1 Encounters:  03/02/17 5\' 10"  (1.778 m)    Physical exam:  General: The patient is awake, alert and appears not in acute distress. Head: Normocephalic, atraumatic. Neck is supple, but short and thick-. Mallampati 2  ,  neck circumference:18.00 . Nasal airflow is Patent. Retrognathia is not seen. Crowded dental status/ Cardiovascular:  Regular rate and rhythm , without  murmurs or carotid bruit.  Respiratory: Lungs are clear to auscultation. Skin:  Without evidence of edema, or rash Trunk: BMI 37, reduced in comp. To last year Neurologic exam : The patient is awake and alert, oriented to place and time.  Mood and affect are appropriate.  Cranial nerves:  No change is ability to taste or smell. Pupils are equal and briskly reactive to light. Hearing to finger rub intact.   Facial sensation intact to fine touch.  Facial motor strength is symmetric and tongue and uvula move midline. Shoulder shrug was symmetrical.   Motor exam:  Normal tone, muscle bulk and symmetric strength in all extremities. Limited ROM for both knees. [name] does have advanced arthritis in both knees, and is scheduled for a total knee replacement was in the week, likely the other side will follow in June or July of this year.   The patient was advised of the nature of the diagnosed sleep disorder , the  treatment options and risks for general a health and wellness arising from not treating the condition.  I spent more than 15 minutes of face to face time with the patient. Greater than 50% of time was spent in counseling and coordination  of care. We have discussed the diagnosis and differential and I answered the patient's questions.     Assessment:  After physical and neurologic examination, review of laboratory studies.   Personal review of imaging studies, reports of other /same  Imaging studies, results of polysomnography/ neurophysiology testing and pre-existing records as far as provided in visit.     1) [name] does have OSA. This caused the EDS and fatigue. CPAP reduced these symptoms.  2) the patient also had hypoxemia related to the apnea during his PSG, this made the case for using CPAP in the treatment.  3) nocturia, much reduced on CPAP. 4) obesity as main risk factor - addressed with a carbohydrate reduced diet.   Plan:  Treatment plan and additional workup :  I like for [name] to continue using his CPAP at the current setting he is using also a dream wear mask and a dream station CPAP machine. I see no reason to change to auto CPAP.  A reduction and sleepiness and fatigue is also reflected in his self-assessment scores, supporting clinical benefit.  He will continue using CPAP especially since a total knee replacement is scheduled on March 28th of this year. Using CPAP reduces the intraoperative risks and prevents prolonged recovery time after anesthesia. He will likely be prescribed opiate pain medication for a short while and this can increase nocturnal apnea.  I encouraged him to continue low carb dieting.   I will see continue to see [name] once a year in follow-up.  Asencion Partridge Cannon Arreola MD  03/02/2017   CC: [name], [address] [place], [place] [address]

## 2017-03-03 ENCOUNTER — Telehealth: Payer: Self-pay | Admitting: Orthopedic Surgery

## 2017-03-03 NOTE — Telephone Encounter (Signed)
Patient wants you to call him regarding his upcoming surgery on 03-09-17.  He has some questions.  Thanks

## 2017-03-03 NOTE — Telephone Encounter (Signed)
SPOKE WITH PATIENT

## 2017-03-04 ENCOUNTER — Encounter (HOSPITAL_COMMUNITY)
Admission: RE | Admit: 2017-03-04 | Discharge: 2017-03-04 | Disposition: A | Discharge status: Home / Self Care | Payer: Federal, State, Local not specified - PPO | Source: Ambulatory Visit | Attending: Orthopedic Surgery | Admitting: Orthopedic Surgery

## 2017-03-04 ENCOUNTER — Encounter (HOSPITAL_COMMUNITY): Payer: Self-pay

## 2017-03-04 DIAGNOSIS — M1712 Unilateral primary osteoarthritis, left knee: Secondary | ICD-10-CM | POA: Insufficient documentation

## 2017-03-04 DIAGNOSIS — Z0183 Encounter for blood typing: Secondary | ICD-10-CM | POA: Diagnosis not present

## 2017-03-04 DIAGNOSIS — Z01812 Encounter for preprocedural laboratory examination: Secondary | ICD-10-CM | POA: Insufficient documentation

## 2017-03-04 LAB — CBC WITH DIFFERENTIAL/PLATELET
Basophils Absolute: 0 10*3/uL (ref 0.0–0.1)
Basophils Relative: 0 %
Eosinophils Absolute: 0.1 10*3/uL (ref 0.0–0.7)
Eosinophils Relative: 2 %
HCT: 37.5 % — ABNORMAL LOW (ref 39.0–52.0)
Hemoglobin: 12.8 g/dL — ABNORMAL LOW (ref 13.0–17.0)
Lymphocytes Relative: 26 %
Lymphs Abs: 1.2 10*3/uL (ref 0.7–4.0)
MCH: 30.8 pg (ref 26.0–34.0)
MCHC: 34.1 g/dL (ref 30.0–36.0)
MCV: 90.4 fL (ref 78.0–100.0)
Monocytes Absolute: 0.4 10*3/uL (ref 0.1–1.0)
Monocytes Relative: 9 %
Neutro Abs: 2.9 10*3/uL (ref 1.7–7.7)
Neutrophils Relative %: 62 %
Platelets: 112 10*3/uL — ABNORMAL LOW (ref 150–400)
RBC: 4.15 MIL/uL — ABNORMAL LOW (ref 4.22–5.81)
RDW: 14 % (ref 11.5–15.5)
WBC: 4.7 10*3/uL (ref 4.0–10.5)

## 2017-03-04 LAB — PROTIME-INR
INR: 1.08
Prothrombin Time: 14 seconds (ref 11.4–15.2)

## 2017-03-04 LAB — BASIC METABOLIC PANEL WITH GFR
Anion gap: 10 (ref 5–15)
BUN: 12 mg/dL (ref 6–20)
CO2: 23 mmol/L (ref 22–32)
Calcium: 8.9 mg/dL (ref 8.9–10.3)
Chloride: 98 mmol/L — ABNORMAL LOW (ref 101–111)
Creatinine, Ser: 1.16 mg/dL (ref 0.61–1.24)
GFR calc Af Amer: >60 mL/min (ref >60)
GFR calc non Af Amer: >60 mL/min (ref >60)
Glucose, Bld: 411 mg/dL — ABNORMAL HIGH (ref 65–99)
Potassium: 3.8 mmol/L (ref 3.5–5.1)
Sodium: 131 mmol/L — ABNORMAL LOW (ref 135–145)

## 2017-03-04 LAB — ABO/RH: ABO/RH(D): A POS

## 2017-03-04 LAB — SURGICAL PCR SCREEN
MRSA, PCR: NEGATIVE
Staphylococcus aureus: POSITIVE — AB

## 2017-03-04 LAB — PREPARE RBC (CROSSMATCH)

## 2017-03-04 LAB — APTT: aPTT: 29 seconds (ref 24–36)

## 2017-03-07 ENCOUNTER — Telehealth: Payer: Self-pay | Admitting: Orthopedic Surgery

## 2017-03-07 ENCOUNTER — Other Ambulatory Visit: Payer: Self-pay | Admitting: *Deleted

## 2017-03-07 NOTE — Pre-Procedure Instructions (Signed)
Patient has glucose of 411, he is a diabetic. Dr Patsey Berthold aware. Will do CBG am of procedure. Patient notified of + staph and to come get his mupercion.

## 2017-03-07 NOTE — Telephone Encounter (Signed)
switch proep meds to vancomycin

## 2017-03-07 NOTE — Telephone Encounter (Signed)
Call received from Maudie Mercury at [facility] Day surgery; states patient tested positive for staff, and medication is being started. Patient is scheduled for total knee surgery 03/09/17.

## 2017-03-07 NOTE — Telephone Encounter (Signed)
Routing to Dr Harrison 

## 2017-03-07 NOTE — Telephone Encounter (Signed)
done

## 2017-03-07 NOTE — Telephone Encounter (Signed)
Done

## 2017-03-08 NOTE — H&P (Addendum)
TOTAL KNEE ADMISSION H&P  Patient is being admitted for left total knee arthroplasty.  Subjective:  Chief Complaint:left knee pain.  HPI: [name], 56 y.o. male, has a history of pain and functional disability in the left knee due to arthritis and has failed non-surgical conservative treatments for greater than 12 weeks to includeNSAID's and/or analgesics, use of assistive devices, weight reduction as appropriate and activity modification.  He has disabling pain in his left knee. He wishes to continue with nonoperative care and wished to proceed with a left total knee replacement  Patient Active Problem List   Diagnosis Date Noted  . History of nocturia 03/02/2017  . OSA on CPAP 03/02/2016  . Hypoxia, sleep related 03/02/2016  . Cyst of mediastinum 01/15/2016  . S/P CABG x 4 08/29/2015  . Coronary artery disease involving native coronary artery with other forms of angina pectoris   . Morbid obesity ([place])   . Hyperglycemia   . Chronic systolic HF (heart failure) ([place])   . Pain in the chest 08/23/2015  . Non-ST elevation MI (NSTEMI) ([place]) 08/23/2015  . Diabetes mellitus type 2, uncontrolled ([place]) 08/23/2015  . Hypertension 08/23/2015  . Hyperlipidemia 08/23/2015  . Osteoarthritis, knee 05/24/2012  . Knee pain 10/29/2011  . Knee stiffness 10/29/2011  . S/P right knee arthroscopy 10/26/2011  . Medial meniscus, posterior horn derangement 09/07/2011  . Lateral meniscus derangement 09/07/2011  . OA (osteoarthritis) of knee 09/07/2011  . Rotator cuff syndrome of left shoulder 08/19/2011  . Right knee meniscal tear 08/19/2011   Past Medical History:  Diagnosis Date  . Arthritis   . CAD (coronary artery disease)    Multivessel disease status post CABG 08/2015  . Essential hypertension   . Hyperlipidemia   . Hypertension   . Sleep apnea    uses CPAP  . Type 2 diabetes mellitus ([place])     Past Surgical History:  Procedure Laterality Date  . APPENDECTOMY    . Biceps  tendon surgery Right   . CARDIAC CATHETERIZATION N/A 08/25/2015   Procedure: Left Heart Cath and Coronary Angiography;  Surgeon: Belva Crome, MD;  Location: [place] [facility];  Service: Cardiovascular;  Laterality: N/A;  . CORONARY ARTERY BYPASS GRAFT N/A 08/29/2015   Procedure: CORONARY ARTERY BYPASS GRAFTING (CABG);  Surgeon: [name], MD;  Location: [place];  Service: Open Heart Surgery;  Laterality: N/A;  . KNEE ARTHROSCOPY Left   . TEE WITHOUT CARDIOVERSION N/A 08/29/2015   Procedure: TRANSESOPHAGEAL ECHOCARDIOGRAM (TEE);  Surgeon: [name], MD;  Location: [place];  Service: Open Heart Surgery;  Laterality: N/A;    No prescriptions prior to admission.   No Known Allergies  Social History  Substance Use Topics  . Smoking status: Never Smoker  . Smokeless tobacco: Never Used  . Alcohol use No    Family History  Problem Relation Age of Onset  . Arthritis    . Cancer    . Diabetes    . CAD Father   . Diabetes Mellitus II Father   . CAD Brother   . Diabetes Mellitus II Brother   . Anesthesia problems Neg Hx   . Hypotension Neg Hx   . Malignant hyperthermia Neg Hx   . Pseudochol deficiency Neg Hx      Review of Systems  Respiratory: Positive for shortness of breath. Negative for sputum production.   Cardiovascular: Positive for leg swelling. Negative for chest pain and orthopnea.  All other systems reviewed and are negative.  Objective:  Physical Exam   Physical Exam  Constitutional: The patient is oriented to person, place, and time. The patient appears well-developed and well-nourished. No distress.  Cardiovascular: Intact distal pulses.   Neurological: The patient alert and oriented to person, place, and time. The patient exhibits normal muscle tone. Coordination normal.  Skin: Skin is warm and dry. No rash noted. The patient is not diaphoretic. No erythema. No pallor.  Psychiatric: The patient has a normal mood and affect. Her behavior is  normal. Judgment and thought content normal.  Gait is abnormal. He is limping. He waddles actually.  The left knee shows decreased range of motion medial and lateral joint line tenderness varus deformity flexion contracture, ligaments are stable muscle strength and tone are normal  His upper extremities show no contracture subluxation atrophy or tremor  On the right knee we also find decreased range of motion contracture in terms of a flexion contracture no instability and normal muscle strength and muscle tone   Vital signs in last 24 hours:    Labs:  BMP Latest Ref Rng & Units 03/04/2017 07/08/2016 05/15/2016  Glucose 65 - 99 mg/dL 411(H) 196(H) 238(H)  BUN 6 - 20 mg/dL 12 19 22(H)  Creatinine 0.61 - 1.24 mg/dL 1.16 1.50(H) 1.31(H)  Sodium 135 - 145 mmol/L 131(L) 135 134(L)  Potassium 3.5 - 5.1 mmol/L 3.8 3.7 3.9  Chloride 101 - 111 mmol/L 98(L) 101 101  CO2 22 - 32 mmol/L 23 24 24   Calcium 8.9 - 10.3 mg/dL 8.9 9.4 9.0   CBC Latest Ref Rng & Units 03/04/2017 07/08/2016 05/15/2016  WBC 4.0 - 10.5 K/uL 4.7 5.8 6.9  Hemoglobin 13.0 - 17.0 g/dL 12.8(L) 13.1 13.7  Hematocrit 39.0 - 52.0 % 37.5(L) 37.4(L) 39.7  Platelets 150 - 400 K/uL 112(L) 141(L) 174    Estimated body mass index is 36.88 kg/m as calculated from the following:   Height as of 03/04/17: 5\' 10"  (1.778 m).   Weight as of 03/04/17: 257 lb (116.6 kg).   Imaging Review Plain radiographs demonstrate severe degenerative joint disease of the left knee(s). The overall alignment issignificant varus. The bone quality appears to be good for age and reported activity level.  Assessment/Plan:  End stage arthritis, left knee   The patient history, physical examination, clinical judgment of the provider and imaging studies are consistent with end stage degenerative joint disease of the left knee(s) and total knee arthroplasty is deemed medically necessary. The treatment options including medical management, injection therapy  arthroscopy and arthroplasty were discussed at length. The risks and benefits of total knee arthroplasty were presented and reviewed. The risks due to aseptic loosening, infection, stiffness, patella tracking problems, thromboembolic complications and other imponderables were discussed. The patient acknowledged the explanation, agreed to proceed with the plan and consent was signed. Patient is being admitted for inpatient treatment for surgery, pain control, PT, OT, prophylactic antibiotics, VTE prophylaxis, progressive ambulation and ADL's and discharge planning. The patient is planning to be discharged home with home health services

## 2017-03-09 ENCOUNTER — Inpatient Hospital Stay (HOSPITAL_COMMUNITY): Payer: Federal, State, Local not specified - PPO | Admitting: Anesthesiology

## 2017-03-09 ENCOUNTER — Inpatient Hospital Stay (HOSPITAL_COMMUNITY): Payer: Federal, State, Local not specified - PPO

## 2017-03-09 ENCOUNTER — Inpatient Hospital Stay (HOSPITAL_COMMUNITY)
Admission: RE | Admit: 2017-03-09 | Discharge: 2017-03-12 | DRG: 470 | Disposition: A | Discharge status: Home Health | Payer: Federal, State, Local not specified - PPO | Source: Ambulatory Visit | Attending: Orthopedic Surgery | Admitting: Orthopedic Surgery

## 2017-03-09 ENCOUNTER — Encounter (HOSPITAL_COMMUNITY): Payer: Self-pay | Admitting: *Deleted

## 2017-03-09 ENCOUNTER — Encounter (HOSPITAL_COMMUNITY)
Admission: RE | Disposition: A | Discharge status: Home Health | Payer: Self-pay | Source: Ambulatory Visit | Attending: Orthopedic Surgery

## 2017-03-09 DIAGNOSIS — I5022 Chronic systolic (congestive) heart failure: Secondary | ICD-10-CM | POA: Diagnosis not present

## 2017-03-09 DIAGNOSIS — I110 Hypertensive heart disease with heart failure: Secondary | ICD-10-CM | POA: Diagnosis present

## 2017-03-09 DIAGNOSIS — G4733 Obstructive sleep apnea (adult) (pediatric): Secondary | ICD-10-CM | POA: Diagnosis present

## 2017-03-09 DIAGNOSIS — I2510 Atherosclerotic heart disease of native coronary artery without angina pectoris: Secondary | ICD-10-CM | POA: Diagnosis not present

## 2017-03-09 DIAGNOSIS — E1165 Type 2 diabetes mellitus with hyperglycemia: Secondary | ICD-10-CM | POA: Diagnosis present

## 2017-03-09 DIAGNOSIS — M1712 Unilateral primary osteoarthritis, left knee: Secondary | ICD-10-CM | POA: Diagnosis not present

## 2017-03-09 DIAGNOSIS — Z96652 Presence of left artificial knee joint: Secondary | ICD-10-CM | POA: Diagnosis not present

## 2017-03-09 DIAGNOSIS — M170 Bilateral primary osteoarthritis of knee: Secondary | ICD-10-CM | POA: Diagnosis not present

## 2017-03-09 DIAGNOSIS — Z6838 Body mass index (BMI) 38.0-38.9, adult: Secondary | ICD-10-CM | POA: Diagnosis not present

## 2017-03-09 DIAGNOSIS — Z951 Presence of aortocoronary bypass graft: Secondary | ICD-10-CM

## 2017-03-09 DIAGNOSIS — Z471 Aftercare following joint replacement surgery: Secondary | ICD-10-CM | POA: Diagnosis not present

## 2017-03-09 DIAGNOSIS — I252 Old myocardial infarction: Secondary | ICD-10-CM

## 2017-03-09 DIAGNOSIS — M179 Osteoarthritis of knee, unspecified: Secondary | ICD-10-CM | POA: Diagnosis not present

## 2017-03-09 DIAGNOSIS — K219 Gastro-esophageal reflux disease without esophagitis: Secondary | ICD-10-CM | POA: Diagnosis present

## 2017-03-09 HISTORY — PX: TOTAL KNEE ARTHROPLASTY: SHX125

## 2017-03-09 LAB — GLUCOSE, CAPILLARY
Glucose-Capillary: 302 mg/dL — ABNORMAL HIGH (ref 65–99)
Glucose-Capillary: 249 mg/dL — ABNORMAL HIGH (ref 65–99)
Glucose-Capillary: 215 mg/dL — ABNORMAL HIGH (ref 65–99)
Glucose-Capillary: 195 mg/dL — ABNORMAL HIGH (ref 65–99)

## 2017-03-09 SURGERY — ARTHROPLASTY, KNEE, TOTAL
Anesthesia: Spinal | Laterality: Left

## 2017-03-09 MED ORDER — FENTANYL CITRATE (PF) 100 MCG/2ML IJ SOLN
25.0000 ug | INTRAMUSCULAR | Status: AC
  Administered 2017-03-09 (×2): 25 ug via INTRAVENOUS

## 2017-03-09 MED ORDER — ACETAMINOPHEN 325 MG PO TABS: 650.0000 mg | ORAL_TABLET | Freq: Four times a day (QID) | ORAL | Status: DC | PRN

## 2017-03-09 MED ORDER — DOCUSATE SODIUM 100 MG PO CAPS
100.0000 mg | ORAL_CAPSULE | Freq: Two times a day (BID) | ORAL | Status: DC
  Administered 2017-03-09 – 2017-03-12 (×6): 100 mg via ORAL
  Filled 2017-03-09 (×6): qty 1

## 2017-03-09 MED ORDER — DIPHENHYDRAMINE HCL 12.5 MG/5ML PO ELIX: 12.5000 mg | ORAL_SOLUTION | ORAL | Status: DC | PRN

## 2017-03-09 MED ORDER — METHOCARBAMOL 1000 MG/10ML IJ SOLN
500.0000 mg | Freq: Once | INTRAVENOUS | Status: AC
  Administered 2017-03-09: 500 mg via INTRAVENOUS
  Filled 2017-03-09: qty 5

## 2017-03-09 MED ORDER — OXYCODONE HCL 5 MG PO TABS
ORAL_TABLET | ORAL | Status: AC
  Filled 2017-03-09: qty 1

## 2017-03-09 MED ORDER — PHENYLEPHRINE HCL 10 MG/ML IJ SOLN
INTRAMUSCULAR | Status: DC | PRN
  Administered 2017-03-09 (×2): 40 ug via INTRAVENOUS
  Administered 2017-03-09 (×3): 80 ug via INTRAVENOUS

## 2017-03-09 MED ORDER — ONDANSETRON HCL 4 MG/2ML IJ SOLN: 4.0000 mg | Freq: Four times a day (QID) | INTRAMUSCULAR | Status: DC | PRN

## 2017-03-09 MED ORDER — MENTHOL 3 MG MT LOZG: 1.0000 | LOZENGE | OROMUCOSAL | Status: DC | PRN

## 2017-03-09 MED ORDER — BUPIVACAINE-EPINEPHRINE (PF) 0.5% -1:200000 IJ SOLN
INTRAMUSCULAR | Status: DC | PRN
  Administered 2017-03-09: 30 mL via PERINEURAL

## 2017-03-09 MED ORDER — BUPIVACAINE-EPINEPHRINE (PF) 0.5% -1:200000 IJ SOLN
INTRAMUSCULAR | Status: AC
  Filled 2017-03-09: qty 30

## 2017-03-09 MED ORDER — ONDANSETRON HCL 4 MG/2ML IJ SOLN
INTRAMUSCULAR | Status: AC
  Filled 2017-03-09: qty 2

## 2017-03-09 MED ORDER — SENNOSIDES-DOCUSATE SODIUM 8.6-50 MG PO TABS: 1.0000 | ORAL_TABLET | Freq: Every evening | ORAL | Status: DC | PRN

## 2017-03-09 MED ORDER — TRANEXAMIC ACID 1000 MG/10ML IV SOLN
1000.0000 mg | INTRAVENOUS | Status: AC
  Administered 2017-03-09: 1000 mg via INTRAVENOUS
  Filled 2017-03-09: qty 10

## 2017-03-09 MED ORDER — FENTANYL CITRATE (PF) 100 MCG/2ML IJ SOLN
INTRAMUSCULAR | Status: AC
  Filled 2017-03-09: qty 2

## 2017-03-09 MED ORDER — BISACODYL 5 MG PO TBEC: 5.0000 mg | DELAYED_RELEASE_TABLET | Freq: Every day | ORAL | Status: DC | PRN

## 2017-03-09 MED ORDER — EPHEDRINE SULFATE 50 MG/ML IJ SOLN
INTRAMUSCULAR | Status: DC | PRN
  Administered 2017-03-09 (×2): 10 mg via INTRAVENOUS
  Administered 2017-03-09: 5 mg via INTRAVENOUS

## 2017-03-09 MED ORDER — BUPIVACAINE LIPOSOME 1.3 % IJ SUSP
INTRAMUSCULAR | Status: AC
  Filled 2017-03-09: qty 20

## 2017-03-09 MED ORDER — ASPIRIN EC 325 MG PO TBEC
325.0000 mg | DELAYED_RELEASE_TABLET | Freq: Every day | ORAL | Status: DC
  Administered 2017-03-10 – 2017-03-12 (×3): 325 mg via ORAL
  Filled 2017-03-09 (×3): qty 1

## 2017-03-09 MED ORDER — MIDAZOLAM HCL 5 MG/5ML IJ SOLN
INTRAMUSCULAR | Status: DC | PRN
  Administered 2017-03-09 (×2): 1 mg via INTRAVENOUS

## 2017-03-09 MED ORDER — PROPOFOL 10 MG/ML IV BOLUS
INTRAVENOUS | Status: AC
  Filled 2017-03-09: qty 40

## 2017-03-09 MED ORDER — PHENOL 1.4 % MT LIQD: 1.0000 | OROMUCOSAL | Status: DC | PRN

## 2017-03-09 MED ORDER — ONDANSETRON HCL 4 MG/2ML IJ SOLN
4.0000 mg | Freq: Once | INTRAMUSCULAR | Status: AC
  Administered 2017-03-09: 4 mg via INTRAVENOUS

## 2017-03-09 MED ORDER — HYDROCODONE-ACETAMINOPHEN 7.5-325 MG PO TABS
1.0000 | ORAL_TABLET | Freq: Four times a day (QID) | ORAL | Status: DC
  Administered 2017-03-09 – 2017-03-12 (×9): 1 via ORAL
  Filled 2017-03-09 (×9): qty 1

## 2017-03-09 MED ORDER — ALUM & MAG HYDROXIDE-SIMETH 200-200-20 MG/5ML PO SUSP: 30.0000 mL | ORAL | Status: DC | PRN

## 2017-03-09 MED ORDER — CHLORHEXIDINE GLUCONATE 4 % EX LIQD: 60.0000 mL | Freq: Once | CUTANEOUS | Status: DC

## 2017-03-09 MED ORDER — BUPIVACAINE LIPOSOME 1.3 % IJ SUSP: 20.0000 mL | Freq: Once | INTRAMUSCULAR | Status: DC

## 2017-03-09 MED ORDER — INSULIN ASPART 100 UNIT/ML ~~LOC~~ SOLN
8.0000 [IU] | Freq: Once | SUBCUTANEOUS | Status: AC
Start: 2017-03-09 — End: 2017-03-09
  Administered 2017-03-09: 8 [IU] via SUBCUTANEOUS
  Filled 2017-03-09: qty 0.08

## 2017-03-09 MED ORDER — PREGABALIN 50 MG PO CAPS
50.0000 mg | ORAL_CAPSULE | Freq: Once | ORAL | Status: AC
  Administered 2017-03-09: 50 mg via ORAL

## 2017-03-09 MED ORDER — MIDAZOLAM HCL 2 MG/2ML IJ SOLN
1.0000 mg | INTRAMUSCULAR | Status: AC
  Administered 2017-03-09 (×2): 2 mg via INTRAVENOUS
  Filled 2017-03-09: qty 2

## 2017-03-09 MED ORDER — 0.9 % SODIUM CHLORIDE (POUR BTL) OPTIME
TOPICAL | Status: DC | PRN
  Administered 2017-03-09: 1000 mL

## 2017-03-09 MED ORDER — DEXAMETHASONE SODIUM PHOSPHATE 10 MG/ML IJ SOLN
10.0000 mg | Freq: Once | INTRAMUSCULAR | Status: AC
  Administered 2017-03-10: 10 mg via INTRAVENOUS
  Filled 2017-03-09: qty 1

## 2017-03-09 MED ORDER — METOCLOPRAMIDE HCL 5 MG/ML IJ SOLN: 5.0000 mg | Freq: Three times a day (TID) | INTRAMUSCULAR | Status: DC | PRN

## 2017-03-09 MED ORDER — CELECOXIB 100 MG PO CAPS
200.0000 mg | ORAL_CAPSULE | Freq: Two times a day (BID) | ORAL | Status: DC
  Administered 2017-03-09 – 2017-03-12 (×6): 200 mg via ORAL
  Filled 2017-03-09 (×6): qty 2

## 2017-03-09 MED ORDER — MAGNESIUM CITRATE PO SOLN: 1.0000 | Freq: Once | ORAL | Status: DC | PRN

## 2017-03-09 MED ORDER — ONDANSETRON HCL 4 MG PO TABS: 4.0000 mg | ORAL_TABLET | Freq: Four times a day (QID) | ORAL | Status: DC | PRN

## 2017-03-09 MED ORDER — BUPIVACAINE IN DEXTROSE 0.75-8.25 % IT SOLN
INTRATHECAL | Status: AC
  Filled 2017-03-09: qty 2

## 2017-03-09 MED ORDER — FENTANYL CITRATE (PF) 100 MCG/2ML IJ SOLN
INTRAMUSCULAR | Status: DC | PRN
  Administered 2017-03-09: 25 ug via INTRATHECAL
  Administered 2017-03-09 (×3): 25 ug via INTRAVENOUS

## 2017-03-09 MED ORDER — METHOCARBAMOL 1000 MG/10ML IJ SOLN
500.0000 mg | Freq: Four times a day (QID) | INTRAVENOUS | Status: DC | PRN
  Filled 2017-03-09: qty 5

## 2017-03-09 MED ORDER — VANCOMYCIN HCL IN DEXTROSE 1-5 GM/200ML-% IV SOLN
1000.0000 mg | INTRAVENOUS | Status: AC
  Administered 2017-03-09: 1000 mg via INTRAVENOUS
  Filled 2017-03-09: qty 200

## 2017-03-09 MED ORDER — MUPIROCIN 2 % EX OINT
1.0000 "application " | TOPICAL_OINTMENT | Freq: Two times a day (BID) | CUTANEOUS | Status: DC
  Administered 2017-03-09 – 2017-03-12 (×6): 1 via NASAL
  Filled 2017-03-09 (×3): qty 22

## 2017-03-09 MED ORDER — PROPOFOL 10 MG/ML IV BOLUS
INTRAVENOUS | Status: AC
  Filled 2017-03-09: qty 20

## 2017-03-09 MED ORDER — SODIUM CHLORIDE 0.9 % IJ SOLN
INTRAMUSCULAR | Status: AC
  Filled 2017-03-09: qty 40

## 2017-03-09 MED ORDER — METHOCARBAMOL 500 MG PO TABS
500.0000 mg | ORAL_TABLET | Freq: Four times a day (QID) | ORAL | Status: DC | PRN
  Administered 2017-03-09 – 2017-03-11 (×2): 500 mg via ORAL
  Filled 2017-03-09 (×2): qty 1

## 2017-03-09 MED ORDER — CELECOXIB 400 MG PO CAPS
ORAL_CAPSULE | ORAL | Status: AC
  Filled 2017-03-09: qty 1

## 2017-03-09 MED ORDER — LACTATED RINGERS IV SOLN
INTRAVENOUS | Status: DC
  Administered 2017-03-09 (×3): via INTRAVENOUS

## 2017-03-09 MED ORDER — GABAPENTIN 300 MG PO CAPS
300.0000 mg | ORAL_CAPSULE | Freq: Three times a day (TID) | ORAL | Status: DC
Start: 2017-03-09 — End: 2017-03-12
  Administered 2017-03-09 – 2017-03-12 (×8): 300 mg via ORAL
  Filled 2017-03-09 (×8): qty 1

## 2017-03-09 MED ORDER — ACETAMINOPHEN 650 MG RE SUPP: 650.0000 mg | Freq: Four times a day (QID) | RECTAL | Status: DC | PRN

## 2017-03-09 MED ORDER — BUPIVACAINE IN DEXTROSE 0.75-8.25 % IT SOLN
INTRATHECAL | Status: DC | PRN
  Administered 2017-03-09: 15 mg via INTRATHECAL

## 2017-03-09 MED ORDER — HYDROMORPHONE HCL 1 MG/ML IJ SOLN: 0.2500 mg | INTRAMUSCULAR | Status: DC | PRN

## 2017-03-09 MED ORDER — PREGABALIN 50 MG PO CAPS
ORAL_CAPSULE | ORAL | Status: AC
  Filled 2017-03-09: qty 1

## 2017-03-09 MED ORDER — MIDAZOLAM HCL 2 MG/2ML IJ SOLN
INTRAMUSCULAR | Status: AC
  Filled 2017-03-09: qty 2

## 2017-03-09 MED ORDER — SODIUM CHLORIDE 0.9 % IV SOLN
INTRAVENOUS | Status: DC
  Administered 2017-03-09: 17:00:00 via INTRAVENOUS

## 2017-03-09 MED ORDER — KETOROLAC TROMETHAMINE 15 MG/ML IJ SOLN
15.0000 mg | Freq: Four times a day (QID) | INTRAMUSCULAR | Status: AC
  Administered 2017-03-09 – 2017-03-10 (×4): 15 mg via INTRAVENOUS
  Filled 2017-03-09 (×4): qty 1

## 2017-03-09 MED ORDER — CHLORHEXIDINE GLUCONATE CLOTH 2 % EX PADS
6.0000 | MEDICATED_PAD | Freq: Every day | CUTANEOUS | Status: DC
  Administered 2017-03-10 – 2017-03-11 (×3): 6 via TOPICAL

## 2017-03-09 MED ORDER — CELECOXIB 400 MG PO CAPS
400.0000 mg | ORAL_CAPSULE | Freq: Once | ORAL | Status: AC
  Administered 2017-03-09: 400 mg via ORAL

## 2017-03-09 MED ORDER — VANCOMYCIN HCL IN DEXTROSE 1-5 GM/200ML-% IV SOLN
1000.0000 mg | Freq: Two times a day (BID) | INTRAVENOUS | Status: AC
  Administered 2017-03-09: 1000 mg via INTRAVENOUS
  Filled 2017-03-09: qty 200

## 2017-03-09 MED ORDER — SODIUM CHLORIDE 0.9 % IR SOLN
Status: DC | PRN
  Administered 2017-03-09: 3000 mL

## 2017-03-09 MED ORDER — DEXTROSE 5 % IV SOLN
INTRAVENOUS | Status: DC | PRN
  Administered 2017-03-09: 11:00:00 via INTRAVENOUS

## 2017-03-09 MED ORDER — SODIUM CHLORIDE 0.9 % IV SOLN
INTRAVENOUS | Status: DC | PRN
  Administered 2017-03-09: 60 mL

## 2017-03-09 MED ORDER — HYDROMORPHONE HCL 1 MG/ML IJ SOLN
0.5000 mg | INTRAMUSCULAR | Status: DC | PRN
  Administered 2017-03-09 – 2017-03-11 (×2): 0.5 mg via INTRAVENOUS
  Filled 2017-03-09 (×2): qty 1

## 2017-03-09 MED ORDER — PROPOFOL 500 MG/50ML IV EMUL
INTRAVENOUS | Status: DC | PRN
  Administered 2017-03-09 (×2): via INTRAVENOUS
  Administered 2017-03-09: 35 ug/kg/min via INTRAVENOUS

## 2017-03-09 MED ORDER — OXYCODONE HCL 5 MG PO TABS
5.0000 mg | ORAL_TABLET | ORAL | Status: DC | PRN
  Administered 2017-03-09 (×2): 5 mg via ORAL
  Administered 2017-03-10 (×4): 10 mg via ORAL
  Administered 2017-03-11: 5 mg via ORAL
  Administered 2017-03-11 – 2017-03-12 (×3): 10 mg via ORAL
  Filled 2017-03-09: qty 2
  Filled 2017-03-09: qty 1
  Filled 2017-03-09: qty 2
  Filled 2017-03-09: qty 1
  Filled 2017-03-09 (×4): qty 2
  Filled 2017-03-09: qty 1
  Filled 2017-03-09: qty 2

## 2017-03-09 MED ORDER — METOCLOPRAMIDE HCL 10 MG PO TABS: 5.0000 mg | ORAL_TABLET | Freq: Three times a day (TID) | ORAL | Status: DC | PRN

## 2017-03-09 MED ORDER — OXYCODONE HCL 5 MG PO TABS
5.0000 mg | ORAL_TABLET | Freq: Once | ORAL | Status: AC
  Administered 2017-03-09: 5 mg via ORAL

## 2017-03-09 SURGICAL SUPPLY — 69 items
BAG HAMPER (MISCELLANEOUS) ×2 IMPLANT
BANDAGE ESMARK 6X9 LF (GAUZE/BANDAGES/DRESSINGS) ×1 IMPLANT
BIT DRILL 3.2X128 (BIT) IMPLANT
BLADE HEX COATED 2.75 (ELECTRODE) ×2 IMPLANT
BLADE SAGITTAL 25.0X1.27X90 (BLADE) ×2 IMPLANT
BNDG CMPR 9X6 STRL LF SNTH (GAUZE/BANDAGES/DRESSINGS) ×1
BNDG ESMARK 6X9 LF (GAUZE/BANDAGES/DRESSINGS) ×2
BOWL SMART MIX CTS (DISPOSABLE) IMPLANT
CAP KNEE TOTAL 3 SIGMA ×1 IMPLANT
CEMENT HV SMART SET (Cement) ×4 IMPLANT
CLOTH BEACON ORANGE TIMEOUT ST (SAFETY) ×2 IMPLANT
COOLER CRYO CUFF IC AND MOTOR (MISCELLANEOUS) ×2 IMPLANT
COVER LIGHT HANDLE STERIS (MISCELLANEOUS) ×4 IMPLANT
CUFF CRYO KNEE LG 20X31 COOLER (ORTHOPEDIC SUPPLIES) ×1 IMPLANT
CUFF CRYO KNEE18X23 MED (MISCELLANEOUS) IMPLANT
CUFF TOURNIQUET SINGLE 34IN LL (TOURNIQUET CUFF) ×1 IMPLANT
CUFF TOURNIQUET SINGLE 44IN (TOURNIQUET CUFF) IMPLANT
DECANTER SPIKE VIAL GLASS SM (MISCELLANEOUS) ×2 IMPLANT
DRAPE BACK TABLE (DRAPES) ×2 IMPLANT
DRAPE EXTREMITY T 121X128X90 (DRAPE) ×2 IMPLANT
DRESSING AQUACEL AG ADV 3.5X12 (MISCELLANEOUS) ×1 IMPLANT
DRSG AQUACEL AG ADV 3.5X12 (MISCELLANEOUS) ×2
DRSG MEPILEX BORDER 4X12 (GAUZE/BANDAGES/DRESSINGS) ×2 IMPLANT
DURAPREP 26ML APPLICATOR (WOUND CARE) ×4 IMPLANT
ELECT REM PT RETURN 9FT ADLT (ELECTROSURGICAL) ×2
ELECTRODE REM PT RTRN 9FT ADLT (ELECTROSURGICAL) ×1 IMPLANT
EVACUATOR 3/16  PVC DRAIN (DRAIN) ×1
EVACUATOR 3/16 PVC DRAIN (DRAIN) ×1 IMPLANT
GLOVE BIO SURGEON STRL SZ7 (GLOVE) ×4 IMPLANT
GLOVE BIOGEL PI IND STRL 7.0 (GLOVE) ×2 IMPLANT
GLOVE BIOGEL PI INDICATOR 7.0 (GLOVE) ×4
GLOVE SKINSENSE NS SZ8.0 LF (GLOVE) ×3
GLOVE SKINSENSE STRL SZ8.0 LF (GLOVE) ×2 IMPLANT
GLOVE SS N UNI LF 8.5 STRL (GLOVE) ×4 IMPLANT
GOWN STRL REUS W/ TWL LRG LVL3 (GOWN DISPOSABLE) ×1 IMPLANT
GOWN STRL REUS W/TWL LRG LVL3 (GOWN DISPOSABLE) ×6 IMPLANT
GOWN STRL REUS W/TWL XL LVL3 (GOWN DISPOSABLE) ×3 IMPLANT
HANDPIECE INTERPULSE COAX TIP (DISPOSABLE) ×2
HOOD W/PEELAWAY (MISCELLANEOUS) ×9 IMPLANT
INST SET MAJOR BONE (KITS) ×2 IMPLANT
IV NS IRRIG 3000ML ARTHROMATIC (IV SOLUTION) ×2 IMPLANT
KIT BLADEGUARD II DBL (SET/KITS/TRAYS/PACK) ×2 IMPLANT
KIT ROOM TURNOVER APOR (KITS) ×2 IMPLANT
MANIFOLD NEPTUNE II (INSTRUMENTS) ×2 IMPLANT
MARKER SKIN DUAL TIP RULER LAB (MISCELLANEOUS) ×2 IMPLANT
NDL HYPO 21X1.5 SAFETY (NEEDLE) ×1 IMPLANT
NEEDLE HYPO 21X1.5 SAFETY (NEEDLE) ×2 IMPLANT
NS IRRIG 1000ML POUR BTL (IV SOLUTION) ×2 IMPLANT
PACK TOTAL JOINT (CUSTOM PROCEDURE TRAY) ×2 IMPLANT
PAD ARMBOARD 7.5X6 YLW CONV (MISCELLANEOUS) ×2 IMPLANT
PAD DANNIFLEX CPM (ORTHOPEDIC SUPPLIES) ×2 IMPLANT
PIN TROCAR 3 INCH (PIN) IMPLANT
SAW OSC TIP CART 19.5X105X1.3 (SAW) ×2 IMPLANT
SET BASIN LINEN APH (SET/KITS/TRAYS/PACK) ×2 IMPLANT
SET HNDPC FAN SPRY TIP SCT (DISPOSABLE) ×1 IMPLANT
STAPLER VISISTAT 35W (STAPLE) ×2 IMPLANT
SUT BRALON NAB BRD #1 30IN (SUTURE) ×4 IMPLANT
SUT MNCRL 0 VIOLET CTX 36 (SUTURE) ×1 IMPLANT
SUT MON AB 0 CT1 (SUTURE) ×2 IMPLANT
SUT MON AB 2-0 CT1 36 (SUTURE) IMPLANT
SUT MONOCRYL 0 CTX 36 (SUTURE) ×1
SYR 20CC LL (SYRINGE) ×1 IMPLANT
SYR 30ML LL (SYRINGE) ×2 IMPLANT
SYR BULB IRRIGATION 50ML (SYRINGE) ×2 IMPLANT
TOWEL OR 17X26 4PK STRL BLUE (TOWEL DISPOSABLE) ×2 IMPLANT
TOWER CARTRIDGE SMART MIX (DISPOSABLE) ×2 IMPLANT
TRAY FOLEY W/METER SILVER 16FR (SET/KITS/TRAYS/PACK) ×2 IMPLANT
WATER STERILE IRR 1000ML POUR (IV SOLUTION) ×4 IMPLANT
YANKAUER SUCT 12FT TUBE ARGYLE (SUCTIONS) ×2 IMPLANT

## 2017-03-09 NOTE — Op Note (Signed)
03/09/2017  1:15 PM  PATIENT:  [name]  56 y.o. male  PRE-OPERATIVE DIAGNOSIS:  LEFT KNEE OSTEOARTHRITIS  POST-OPERATIVE DIAGNOSIS:  LEFT KNEE OSTEOARTHRITIS  Operative findings grade 4 wear of the entire medial femoral condyle and entire tibial plateau a portion of the medial meniscus was still intact. Anterior cruciate ligament and PCL were intact. Lateral condyle was normal as was the lateral tibial plateau and lateral meniscus  Patellofemoral joint showed mild grade 1 and 2 where  PROCEDURE:  Procedure(s): TOTAL KNEE ARTHROPLASTY (Left)  DEPUY SIGMA FB PS size 4 femur size 4 tibia size 41 patella size 12.5 polyethylene insert  Surgeon Dr. Aline Brochure [phone]  DETAILS    The patient was identified in the preop holding area and the surgical site was confirmed as the left knee. Chart review and update were completed. The patient was taken to the operating room for spinal anesthesia. After successful spinal anesthesia Foley catheter was inserted. The patient was placed supine on the operating table.  Vancomycin antibiotic was given secondary to positive on nasal culture   the left leg was prepped with DuraPrep and draped sterilely. Timeout was completed. The limb was then exsanguinated a  6 inch Esmarch. The tourniquet was elevated to 300 mmHg.   A midline incision was made and taken down to the extensor mechanism followed by medial arthrotomy. The patella was everted. A synovectomy was performed as needed. The osteophytes were resected.  Anterior cruciate ligament and PCL and medial and lateral meniscus were resected.   a 3/8 inch drill bit was used to enter the femoral canal which was suctioned and irrigated until the fluid was clear. The distal femoral cut was set for 11 millimeter resection with a 5   Left Valgus angle. This cut was completed and checked for flatness.   the femur was then measured to a size 4.  The cutting block was placed to match the epicondyles  and the 4 distal cuts were made.   the tibia was subluxated forward and the external alignment guide was placed. We removed 2 mm of bone from the LOWER MEDIAL  side. We set the guide for neutral varus valgus cut related to the  Mechanical axis of the tibia and for slope matching the patient's anatomy. Rotational alignment was set using the tibial tubercle, tibial spine and second metatarsal. The cutting block was pinned and the proximal tibia was resected.    spacer blocks were placed starting with a 10 mm insert to confirm equal flexion-extension gaps. A size  12.5  mm insert balanced the gaps.   We placed the femoral notch cutting guide size 4  and resected the notch.   Trial implants were placed using appropriate size femur , appropriate size tibial baseplate which was measured after the proximal tibia resection. Tibial rotation was set patella tracking was normal   The tibia was then punched per manufacture technique making sure to avoid internal rotation.   The patella measured a size 26   We resected down to a size 14 using a size 11.5 X 41 button.   Final range of motion check was performed with the appropriate size trials as mentioned above. Satisfactory reduction and motion were obtained.   Trial implants were removed. The bone was irrigated and dried and the cement was mixed on the back table  exparel was injected in the soft tissues and posterior capsule of the knee  These implants were then cemented in place. Excess cement was removed. The  cement was allowed to cure. Second irrigation was performed.    FInal range of motion check and stability check was completed  The wound was irrigated third time Hemovac drain was placed, extensor mechanism was closed with #1 Nurolon followed by 0 Monocryl and staples to reapproximate the skin edges and subcutaneous tissue.   Sterile dressing was applied  The patient was taken recovery in stable condition        Surgical assistants by  Simonne Maffucci and Marquita Palms  Spinal anesthetic  No blood administered   SURGEON:  Surgeon(s) and Role:    * Carole Civil, MD - Primary  EBL:  Total I/O In: 8676 [I.V.:2600] Out: 170 [Urine:150; Blood:20]  One Hemovac drain  30 mL of Marcaine with epinephrine and 20 mL of EXPAREL diluted with 40 mL of saline   No specimens  Counts correct  70 minute tourniquet time  Sales executive  PLAN OF CARE: Admit to inpatient   PATIENT DISPOSITION:  PACU - hemodynamically stable.   Delay start of Pharmacological VTE agent (>24hrs) due to surgical blood loss or risk of bleeding: yes

## 2017-03-09 NOTE — Anesthesia Preprocedure Evaluation (Signed)
Anesthesia Evaluation  Patient identified by MRN, date of birth, ID band Patient awake    Reviewed: Allergy & Precautions, NPO status , Patient's Chart, lab work & pertinent test results  Airway Mallampati: II  TM Distance: <3 FB Neck ROM: Full    Dental  (+) Teeth Intact   Pulmonary neg pulmonary ROS, sleep apnea and Continuous Positive Airway Pressure Ventilation ,    breath sounds clear to auscultation       Cardiovascular hypertension, Pt. on medications (-) angina+ CAD and + Past MI   Rhythm:Regular Rate:Normal      Neuro/Psych negative neurological ROS  negative psych ROS   GI/Hepatic Neg liver ROS, GERD  Medicated and Controlled,  Endo/Other  diabetes, Type 2Morbid obesity  Renal/GU negative Renal ROS  negative genitourinary   Musculoskeletal negative musculoskeletal ROS (+) Arthritis ,   Abdominal   Peds negative pediatric ROS (+)  Hematology negative hematology ROS (+)   Anesthesia Other Findings   Reproductive/Obstetrics negative OB ROS                             Anesthesia Physical Anesthesia Plan  ASA: III  Anesthesia Plan: Spinal   Post-op Pain Management:    Induction:   Airway Management Planned: Simple Face Mask  Additional Equipment:   Intra-op Plan:   Post-operative Plan:   Informed Consent: I have reviewed the patients History and Physical, chart, labs and discussed the procedure including the risks, benefits and alternatives for the proposed anesthesia with the patient or authorized representative who has indicated his/her understanding and acceptance.     Plan Discussed with:   Anesthesia Plan Comments:         Anesthesia Quick Evaluation

## 2017-03-09 NOTE — Brief Op Note (Signed)
03/09/2017  1:15 PM  PATIENT:  [name]  56 y.o. male  PRE-OPERATIVE DIAGNOSIS:  LEFT KNEE OSTEOARTHRITIS  POST-OPERATIVE DIAGNOSIS:  LEFT KNEE OSTEOARTHRITIS  Operative findings grade 4 wear of the entire medial femoral condyle and entire tibial plateau a portion of the medial meniscus was still intact. Anterior cruciate ligament and PCL were intact. Lateral condyle was normal as was the lateral tibial plateau and lateral meniscus  Patellofemoral joint showed mild grade 1 and 2 where  PROCEDURE:  Procedure(s): TOTAL KNEE ARTHROPLASTY (Left)  DEPUY SIGMA FB PS size 4 femur size 4 tibia size 41 patella size 12.5 polyethylene insert  Surgeon Dr. Aline Brochure [phone]  DETAILS    The patient was identified in the preop holding area and the surgical site was confirmed as the left knee. Chart review and update were completed. The patient was taken to the operating room for spinal anesthesia. After successful spinal anesthesia Foley catheter was inserted. The patient was placed supine on the operating table.  Vancomycin antibiotic was given secondary to positive on nasal culture   the left leg was prepped with DuraPrep and draped sterilely. Timeout was completed. The limb was then exsanguinated a  6 inch Esmarch. The tourniquet was elevated to 300 mmHg.   A midline incision was made and taken down to the extensor mechanism followed by medial arthrotomy. The patella was everted. A synovectomy was performed as needed. The osteophytes were resected.  Anterior cruciate ligament and PCL and medial and lateral meniscus were resected.   a 3/8 inch drill bit was used to enter the femoral canal which was suctioned and irrigated until the fluid was clear. The distal femoral cut was set for 11 millimeter resection with a 5   Left Valgus angle. This cut was completed and checked for flatness.   the femur was then measured to a size 4.  The cutting block was placed to match the epicondyles  and the 4 distal cuts were made.   the tibia was subluxated forward and the external alignment guide was placed. We removed 2 mm of bone from the LOWER MEDIAL  side. We set the guide for neutral varus valgus cut related to the  Mechanical axis of the tibia and for slope matching the patient's anatomy. Rotational alignment was set using the tibial tubercle, tibial spine and second metatarsal. The cutting block was pinned and the proximal tibia was resected.    spacer blocks were placed starting with a 10 mm insert to confirm equal flexion-extension gaps. A size  12.5  mm insert balanced the gaps.   We placed the femoral notch cutting guide size 4  and resected the notch.   Trial implants were placed using appropriate size femur , appropriate size tibial baseplate which was measured after the proximal tibia resection. Tibial rotation was set patella tracking was normal   The tibia was then punched per manufacture technique making sure to avoid internal rotation.   The patella measured a size 26   We resected down to a size 14 using a size 11.5 X 41 button.   Final range of motion check was performed with the appropriate size trials as mentioned above. Satisfactory reduction and motion were obtained.   Trial implants were removed. The bone was irrigated and dried and the cement was mixed on the back table  exparel was injected in the soft tissues and posterior capsule of the knee  These implants were then cemented in place. Excess cement was removed. The  cement was allowed to cure. Second irrigation was performed.    FInal range of motion check and stability check was completed  The wound was irrigated third time Hemovac drain was placed, extensor mechanism was closed with #1 Nurolon followed by 0 Monocryl and staples to reapproximate the skin edges and subcutaneous tissue.   Sterile dressing was applied  The patient was taken recovery in stable condition        Surgical assistants by  Simonne Maffucci and Marquita Palms  Spinal anesthetic  No blood administered   SURGEON:  Surgeon(s) and Role:    * Carole Civil, MD - Primary  EBL:  Total I/O In: 4037 [I.V.:2600] Out: 170 [Urine:150; Blood:20]  One Hemovac drain  30 mL of Marcaine with epinephrine and 20 mL of EXPAREL diluted with 40 mL of saline   No specimens  Counts correct  70 minute tourniquet time  Sales executive  PLAN OF CARE: Admit to inpatient   PATIENT DISPOSITION:  PACU - hemodynamically stable.   Delay start of Pharmacological VTE agent (>24hrs) due to surgical blood loss or risk of bleeding: yes

## 2017-03-09 NOTE — Transfer of Care (Signed)
Immediate Anesthesia Transfer of Care Note  Patient: [name]  Procedure(s) Performed: Procedure(s): TOTAL KNEE ARTHROPLASTY (Left)  Patient Location: PACU  Anesthesia Type:Spinal  Level of Consciousness: awake and patient cooperative  Airway & Oxygen Therapy: Patient Spontanous Breathing and Patient connected to face mask oxygen  Post-op Assessment: Report given to RN and Post -op Vital signs reviewed and stable  Post vital signs: Reviewed and stable  Last Vitals:  Vitals:   03/09/17 1045 03/09/17 1100  BP: 103/73 106/73  Resp: (!) 27 18  Temp:      Last Pain:  Vitals:   03/09/17 0913  TempSrc: Oral  PainSc: 3       Patients Stated Pain Goal: 9 (50/56/78 8933)  Complications: No apparent anesthesia complications

## 2017-03-09 NOTE — Anesthesia Procedure Notes (Addendum)
Spinal  Patient location during procedure: OR Start time: 03/09/2017 11:21 AM Staffing Resident/CRNA: Bernette Redbird J Preanesthetic Checklist Completed: patient identified, site marked, surgical consent, pre-op evaluation, timeout performed, IV checked, risks and benefits discussed and monitors and equipment checked Spinal Block Patient position: left lateral decubitus Prep: Betadine Patient monitoring: heart rate, cardiac monitor, continuous pulse ox and blood pressure Approach: midline Location: L3-4 Injection technique: single-shot Needle Needle type: Quincke  Needle gauge: 22 G Assessment Sensory level: T8 Additional Notes Needle length 22g x 5inch                           [phone]       12 December 2017

## 2017-03-09 NOTE — Interval H&P Note (Signed)
History and Physical Interval Note:  03/09/2017 10:28 AM  BP 92/64   Temp 98.3 F (36.8 C) (Oral)   Resp (!) 23   SpO2 97%   Skin left knee looks great  [name]  has presented today for surgery, with the diagnosis of LEFT KNEE OSTEOARTHRITIS  The various methods of treatment have been discussed with the patient and family. After consideration of risks, benefits and other options for treatment, the patient has consented to  Procedure(s): TOTAL KNEE ARTHROPLASTY (Left) as a surgical intervention .  The patient's history has been reviewed, patient examined, no change in status, stable for surgery.  I have reviewed the patient's chart and labs.  Questions were answered to the patient's satisfaction.     Arther Abbott

## 2017-03-09 NOTE — Addendum Note (Signed)
Addendum  created 03/09/17 1337 by Charmaine Downs, CRNA   Anesthesia Intra Meds edited

## 2017-03-09 NOTE — Anesthesia Postprocedure Evaluation (Signed)
Anesthesia Post Note  Patient: [name]  Procedure(s) Performed: Procedure(s) (LRB): TOTAL KNEE ARTHROPLASTY (Left)  Patient location during evaluation: PACU Anesthesia Type: Spinal Level of consciousness: awake, oriented and patient cooperative Pain management: pain level controlled Vital Signs Assessment: post-procedure vital signs reviewed and stable Respiratory status: spontaneous breathing, nonlabored ventilation and respiratory function stable Cardiovascular status: blood pressure returned to baseline Postop Assessment: no signs of nausea or vomiting and spinal receding Anesthetic complications: no     Last Vitals:  Vitals:   03/09/17 1045 03/09/17 1100  BP: 103/73 106/73  Resp: (!) 27 18  Temp:      Last Pain:  Vitals:   03/09/17 0913  TempSrc: Oral  PainSc: 3                  [name]

## 2017-03-10 LAB — BASIC METABOLIC PANEL
Anion gap: 7 (ref 5–15)
BUN: 14 mg/dL (ref 6–20)
CO2: 26 mmol/L (ref 22–32)
Calcium: 8.3 mg/dL — ABNORMAL LOW (ref 8.9–10.3)
Chloride: 98 mmol/L — ABNORMAL LOW (ref 101–111)
Creatinine, Ser: 1.19 mg/dL (ref 0.61–1.24)
GFR calc Af Amer: >60 mL/min (ref >60)
GFR calc non Af Amer: >60 mL/min (ref >60)
Glucose, Bld: 283 mg/dL — ABNORMAL HIGH (ref 65–99)
Potassium: 3.8 mmol/L (ref 3.5–5.1)
Sodium: 131 mmol/L — ABNORMAL LOW (ref 135–145)

## 2017-03-10 LAB — GLUCOSE, CAPILLARY
Glucose-Capillary: 316 mg/dL — ABNORMAL HIGH (ref 65–99)
Glucose-Capillary: 423 mg/dL — ABNORMAL HIGH (ref 65–99)
Glucose-Capillary: 459 mg/dL — ABNORMAL HIGH (ref 65–99)
Glucose-Capillary: 473 mg/dL — ABNORMAL HIGH (ref 65–99)
Glucose-Capillary: 568 mg/dL (ref 65–99)

## 2017-03-10 LAB — CBC
HCT: 31.8 % — ABNORMAL LOW (ref 39.0–52.0)
Hemoglobin: 10.9 g/dL — ABNORMAL LOW (ref 13.0–17.0)
MCH: 31.1 pg (ref 26.0–34.0)
MCHC: 34.3 g/dL (ref 30.0–36.0)
MCV: 90.6 fL (ref 78.0–100.0)
Platelets: 90 10*3/uL — ABNORMAL LOW (ref 150–400)
RBC: 3.51 MIL/uL — ABNORMAL LOW (ref 4.22–5.81)
RDW: 13.7 % (ref 11.5–15.5)
WBC: 5.1 10*3/uL (ref 4.0–10.5)

## 2017-03-10 LAB — GLUCOSE, RANDOM: Glucose, Bld: 489 mg/dL — ABNORMAL HIGH (ref 65–99)

## 2017-03-10 MED ORDER — INSULIN ASPART 100 UNIT/ML ~~LOC~~ SOLN
0.0000 [IU] | Freq: Three times a day (TID) | SUBCUTANEOUS | Status: DC
  Administered 2017-03-10: 9 [IU] via SUBCUTANEOUS
  Administered 2017-03-10: 7 [IU] via SUBCUTANEOUS
  Administered 2017-03-11: 5 [IU] via SUBCUTANEOUS
  Administered 2017-03-11: 9 [IU] via SUBCUTANEOUS
  Administered 2017-03-11: 5 [IU] via SUBCUTANEOUS

## 2017-03-10 MED ORDER — INSULIN ASPART 100 UNIT/ML ~~LOC~~ SOLN
15.0000 [IU] | Freq: Once | SUBCUTANEOUS | Status: AC
  Administered 2017-03-10: 15 [IU] via SUBCUTANEOUS

## 2017-03-10 MED ORDER — INSULIN ASPART 100 UNIT/ML ~~LOC~~ SOLN
8.0000 [IU] | Freq: Once | SUBCUTANEOUS | Status: AC
  Administered 2017-03-10: 8 [IU] via SUBCUTANEOUS

## 2017-03-10 NOTE — Care Management Note (Addendum)
Case Management Note  Patient Details  Name: [name] MRN: [id number] Date of Birth: 10/10/1961  Subjective/Objective:   S/p total knee day 1. PT recommends with HH PT and RW. CM verified with [employer] that CPM machine is ordered and notified them that anticipated DC is 03/11/2017. Patient given list of [place] agencies.                  Action/Plan: CM will follow up with patient later today for [facility] choice.    Later entry: Patient elects to use [facility] for [facility] PT and RW. Romualdo Bolk of [facility] notified and will obtain orders from chart.   Expected Discharge Date:     03/11/2017             Expected Discharge Plan:  [place]  In-House Referral:  Clinical Social Work  Discharge planning Services  CM Consult  Post Acute Care Choice:  Durable Medical Equipment, Home Health Choice offered to:  Patient  DME Arranged:  Walker rolling DME Agency:     HH Arranged:  PT [place]:     Status of Service:  In process, will continue to follow  If discussed at Long Length of Stay Meetings, dates discussed:    Additional Comments:  [name], [name], RN 03/10/2017, 11:04 AM

## 2017-03-10 NOTE — Progress Notes (Signed)
qPhysical Therapy Treatment Patient Details Name: [name] MRN: [id number] DOB: October 10, 1961 Today's Date: 03/10/2017    History of Present Illness 56 yo M s/p L TKA    PT Comments    Pt received in bed, and was agreeable to PT tx.  Pt was able to increase gait distance to 2106f with RW and modified independent, and he also negotiated 2 steps at modified interdependent level.   He has met all acute care PT goals, and he is cleared to d/c home with HHPT, and RW.     Follow Up Recommendations  Home health PT;Supervision/Assistance - 24 hour     Equipment Recommendations  Rolling walker with 5" wheels    Recommendations for Other Services       Precautions / Restrictions Precautions Precautions: Fall Precaution Comments: due to recent surgery Restrictions Weight Bearing Restrictions: No    Mobility  Bed Mobility Overal bed mobility: Independent                Transfers Overall transfer level: Independent Equipment used: Rolling walker (2 wheeled)                Ambulation/Gait Ambulation/Gait assistance: Modified independent (Device/Increase time) Ambulation Distance (Feet): 250 Feet Assistive device: Rolling walker (2 wheeled) Gait Pattern/deviations: Step-through pattern;Antalgic;Decreased step length - right;Decreased step length - left         Stairs Stairs: Yes   Stair Management: Two rails;Step to pattern;Forwards Number of Stairs: 2    Wheelchair Mobility    Modified Rankin (Stroke Patients Only)       Balance   Sitting-balance support: No upper extremity supported Sitting balance-Leahy Scale: Normal     Standing balance support: Bilateral upper extremity supported Standing balance-Leahy Scale: Fair                              Cognition Arousal/Alertness: Awake/alert Behavior During Therapy: WFL for tasks assessed/performed Overall Cognitive Status: Within Functional Limits for tasks assessed                                         Exercises Total Joint Exercises Goniometric ROM: L knee extention lacking 7 from full extension, and 80* of flexion    General Comments        Pertinent Vitals/Pain Pain Score: 6  Pain Location: L knee Pain Descriptors / Indicators: Throbbing Pain Intervention(s): Limited activity within patient's tolerance;Monitored during session;Repositioned    Home Living                      Prior Function            PT Goals (current goals can now be found in the care plan section) Acute Rehab PT Goals Patient Stated Goal: To go home PT Goal Formulation: With patient Time For Goal Achievement: 03/17/17 Potential to Achieve Goals: Good Progress towards PT goals: Progressing toward goals    Frequency    BID      PT Plan Current plan remains appropriate    Co-evaluation             End of Session Equipment Utilized During Treatment: Gait belt   Patient left: in bed;in CPM   PT Visit Diagnosis: Muscle weakness (generalized) (M62.81);Other abnormalities of gait and mobility (R26.89)     Time: 13810-1751PT  Time Calculation (min) (ACUTE ONLY): 30 min  Charges:  $Gait Training: 23-37 mins                    G Codes:       Beth Shekinah Pitones, PT, DPT X: [phone]

## 2017-03-10 NOTE — Progress Notes (Signed)
Patient ID: ANDREN BETHEA, male   DOB: 1961/09/04, 56 y.o.   MRN: [id number] Postop day 1 left total knee  BP 111/73 (BP Location: Left Arm)   Pulse 85   Temp 97.1 F (36.2 C) (Oral)   Resp 18   Ht 5\' 10"  (1.778 m)   Wt 271 lb 2.7 oz (123 kg)   SpO2 94%   BMI 38.91 kg/m   I observed Mr. Lechuga ambulatory with PT full weightbearing with a walker  CBC Latest Ref Rng & Units 03/10/2017 03/04/2017 07/08/2016  WBC 4.0 - 10.5 K/uL 5.1 4.7 5.8  Hemoglobin 13.0 - 17.0 g/dL 10.9(L) 12.8(L) 13.1  Hematocrit 39.0 - 52.0 % 31.8(L) 37.5(L) 37.4(L)  Platelets 150 - 400 K/uL 90(L) 112(L) 141(L)   BMP Latest Ref Rng & Units 03/10/2017 03/04/2017 07/08/2016  Glucose 65 - 99 mg/dL 283(H) 411(H) 196(H)  BUN 6 - 20 mg/dL 14 12 19   Creatinine 0.61 - 1.24 mg/dL 1.19 1.16 1.50(H)  Sodium 135 - 145 mmol/L 131(L) 131(L) 135  Potassium 3.5 - 5.1 mmol/L 3.8 3.8 3.7  Chloride 101 - 111 mmol/L 98(L) 98(L) 101  CO2 22 - 32 mmol/L 26 23 24   Calcium 8.9 - 10.3 mg/dL 8.3(L) 8.9 9.4    I placed him a glucose protocol to try to control his sugar and get it to about 110-120  I will stop his IV fluids  Continue therapy, start CPM machine  Prepare for discharge tomorrow

## 2017-03-10 NOTE — Evaluation (Signed)
Occupational Therapy Evaluation Patient Details Name: [name] MRN: [id number] DOB: [birth year] Today's Date: 03/10/2017    History of Present Illness 56 yo M s/p L TKA   Clinical Impression   OT education complete.Pt has all needed DME. No further OT needed    Follow Up Recommendations  No OT follow up    Equipment Recommendations  None recommended by OT    Recommendations for Other Services       Precautions / Restrictions Precautions Precautions: Fall Precaution Comments: due to recent surgery Restrictions Weight Bearing Restrictions: No      Mobility Bed Mobility Overal bed mobility: Independent             General bed mobility comments: pt in chair  Transfers Overall transfer level: Needs assistance Equipment used: Rolling walker (2 wheeled) Transfers: Sit to/from [facility] Sit to Stand: Min guard Stand pivot transfers: Min guard       General transfer comment: VC for hand placement    Balance Overall balance assessment: Needs assistance Sitting-balance support: No upper extremity supported Sitting balance-Leahy Scale: Normal     Standing balance support: Bilateral upper extremity supported Standing balance-Leahy Scale: Fair                             ADL either performed or assessed with clinical judgement   ADL Overall ADL's : Needs assistance/impaired Eating/Feeding: Independent   Grooming: Set up   Upper Body Bathing: Set up;Sitting   Lower Body Bathing: Minimal assistance;Sit to/from stand;Cueing for safety;Cueing for sequencing;Cueing for compensatory techniques Lower Body Bathing Details (indicate cue type and reason): wife will A as needed Upper Body Dressing : Set up;Sitting   Lower Body Dressing: Minimal assistance;Sit to/from stand;Cueing for safety;Cueing for sequencing;Cueing for compensatory techniques Lower Body Dressing Details (indicate cue type and reason): wife will A as  needed Toilet Transfer: Ambulation;RW;Cueing for sequencing;Comfort height toilet;Minimal assistance;Cueing for safety   Toileting- Clothing Manipulation and Hygiene: Min guard;Cueing for safety;Sit to/from stand;Cueing for sequencing     Tub/Shower Transfer Details (indicate cue type and reason): verbalized safety with pts walk in shower Functional mobility during ADLs: Min guard;Caregiver able to provide necessary level of assistance;Cueing for safety;Cueing for sequencing General ADL Comments: OT education complete.  Pt has all needed DMe and wife will A as needed     Vision Patient Visual Report: No change from baseline       Perception     Praxis      Pertinent Vitals/Pain Pain Assessment: 0-10 Pain Score: 5  Pain Location: L knee Pain Descriptors / Indicators: Throbbing Pain Intervention(s): Monitored during session;Repositioned;Limited activity within patient's tolerance     Hand Dominance Right   Extremity/Trunk Assessment Upper Extremity Assessment RUE Deficits / Details: previous bicep tendon rupture - pt states he has limited elbow extension from this.    Lower Extremity Assessment Lower Extremity Assessment: LLE deficits/detail LLE Deficits / Details: Grossly 3/5       Communication Communication Communication: No difficulties   Cognition Arousal/Alertness: Awake/alert Behavior During Therapy: WFL for tasks assessed/performed Overall Cognitive Status: Within Functional Limits for tasks assessed                                                Home Living   Living Arrangements: Spouse/significant other  Available Help at Discharge: Available 24 hours/day Type of Home: House Home Access: Stairs to enter [employer] of Steps: 1-2   Home Layout: One level     Bathroom Shower/Tub: Occupational psychologist: Handicapped height     Home Equipment: Shower seat;Bedside commode;Cane - single point          Prior  Functioning/Environment Level of Independence: Independent  Gait / Transfers Assistance Needed: independent ADL's / Homemaking Assistance Needed: independent   Comments: self employeed for appliance repair                OT Goals(Current goals can be found in the care plan section) Acute Rehab OT Goals Patient Stated Goal: To go home  OT Frequency:     Barriers to D/C:               End of Session Equipment Utilized During Treatment: Surveyor, mining Communication: Mobility status  Activity Tolerance: Patient tolerated treatment well Patient left: in chair;with call bell/phone within reach  OT Visit Diagnosis: Pain;Other abnormalities of gait and mobility (R26.89) Pain - Right/Left: Left Pain - part of body: Knee                Time: 7614-7092 OT Time Calculation (min): 11 min Charges:  OT General Charges $OT Visit: 1 Procedure OT Evaluation $OT Eval Low Complexity: 1 Procedure G-Codes:     Kari Baars, OT [phone]  Payton Mccallum D 03/10/2017, 11:08 AM

## 2017-03-10 NOTE — Progress Notes (Signed)
RN went into pts room to discontinue foley, after assessing pt RN noticed pts pupils look smaller (73mm) than at the beginning of the shift. Pt is alert and oriented, talking in the room. Foley catheter discontinued. Educated pt on voiding within 8 hours after catheter removal. Pt verbalized understanding.

## 2017-03-10 NOTE — Progress Notes (Signed)
Inpatient Diabetes Program Recommendations  AACE/ADA: New Consensus Statement on Inpatient Glycemic Control (2015)  Target Ranges:  Prepandial:   less than 140 mg/dL      Peak postprandial:   less than 180 mg/dL (1-2 hours)      Critically ill patients:  140 - 180 mg/dL  Results for SHONDELL, POULSON (MRN [id number]) as of 03/10/2017 07:58  Ref. Range 03/10/2017 04:41  Glucose Latest Ref Range: 65 - 99 mg/dL 283 (H)   Results for KAMERAN, MCNEESE (MRN [id number]) as of 03/10/2017 07:58  Ref. Range 03/09/2017 08:54 03/09/2017 09:48 03/09/2017 11:01 03/09/2017 13:27  Glucose-Capillary Latest Ref Range: 65 - 99 mg/dL 302 (H) 249 (H) 215 (H) 195 (H)   Review of Glycemic Control  Diabetes history: DM2 Outpatient Diabetes medications: Humalog 75/25 70 units BID, Kombiglyze 2.04-999 mg BID Current orders for Inpatient glycemic control: Novolog 0-9 units TID with meals  Inpatient Diabetes Program Recommendations: Insulin - Basal: Please consider ordering Lantus 15 units daily starting now. Correction (SSI): Please increase Novolog correction to moderate scale (0-15 units) and add Novolog bedtime correction 0-5 units QHS. HgbA1C: Please add on an A1C to blood in lab to evaluate glycemic control over the past 2-3 months.  Thanks, Barnie Alderman, RN, MSN, CDE Diabetes Coordinator Inpatient Diabetes Program [phone] (Team Pager from 8am to 5pm)

## 2017-03-10 NOTE — Anesthesia Postprocedure Evaluation (Signed)
Anesthesia Post Note  Patient: [name]  Procedure(s) Performed: Procedure(s) (LRB): TOTAL KNEE ARTHROPLASTY (Left)  Patient location during evaluation: Nursing Unit Anesthesia Type: Spinal Level of consciousness: awake and alert Pain management: satisfactory to patient Vital Signs Assessment: post-procedure vital signs reviewed and stable Respiratory status: spontaneous breathing Cardiovascular status: stable Anesthetic complications: no     Last Vitals:  Vitals:   03/09/17 2343 03/10/17 0400  BP: 115/80 (!) 116/59  Pulse: 90 91  Resp: 16 18  Temp: 36.8 C 36.6 C    Last Pain:  Vitals:   03/10/17 0616  TempSrc:   PainSc: Asleep                 Breyon Sigg

## 2017-03-10 NOTE — Addendum Note (Signed)
Addendum  created 03/10/17 0800 by Vista Deck, CRNA   Sign clinical note

## 2017-03-10 NOTE — Evaluation (Signed)
Physical Therapy Evaluation Patient Details Name: [name] MRN: [id number] DOB: 10-20-1961 Today's Date: 03/10/2017   History of Present Illness  56 yo M s/p L TKA  Clinical Impression  Pt received sitting up on the EOB, and was agreeable to PT evaluation.  Pt expressed that he was independent with all functional mobility and ADL's prior to admission.  During PT evaluation he was able to perform sit<>stand transfer with min guard and RW, and ambulated 25ft with RW and min guard while progressing to a step through pattern.  He is recommended to return home with HHPT, and a RW.      Follow Up Recommendations Home health PT;Supervision/Assistance - 24 hour    Equipment Recommendations  Rolling walker with 5" wheels    Recommendations for Other Services       Precautions / Restrictions Precautions Precautions: Fall Precaution Comments: due to recent surgery Restrictions Weight Bearing Restrictions: No      Mobility  Bed Mobility Overal bed mobility: Independent             General bed mobility comments: increased time  Transfers Overall transfer level: Needs assistance Equipment used: Rolling walker (2 wheeled) Transfers: Sit to/from Stand Sit to Stand: Min guard            Ambulation/Gait Ambulation/Gait assistance: Min guard Ambulation Distance (Feet): 80 Feet Assistive device: Rolling walker (2 wheeled) Gait Pattern/deviations: Step-to pattern;Step-through pattern;Antalgic   Gait velocity interpretation: <1.8 ft/sec, indicative of risk for recurrent falls General Gait Details: decreased L terminal knee extension.  Progressed to step through pattern on the way back to the room. Vc's for breathing.   Stairs            Wheelchair Mobility    Modified Rankin (Stroke Patients Only)       Balance Overall balance assessment: Needs assistance Sitting-balance support: No upper extremity supported Sitting balance-Leahy Scale: Normal      Standing balance support: Bilateral upper extremity supported Standing balance-Leahy Scale: Fair                               Pertinent Vitals/Pain Pain Assessment: 0-10 Pain Score: 4  Pain Location: L knee Pain Descriptors / Indicators: Throbbing;Burning Pain Intervention(s): Limited activity within patient's tolerance;Monitored during session;Repositioned    Home Living   Living Arrangements: Spouse/significant other Available Help at Discharge: Available 24 hours/day Type of Home: House Home Access: Stairs to enter   [employer] of Steps: 1-2 Home Layout: One level Home Equipment: Shower seat;Bedside commode;Cane - single point      Prior Function Level of Independence: Independent   Gait / Transfers Assistance Needed: independent  ADL's / Homemaking Assistance Needed: independent  Comments: self employeed for appliance repair     Hand Dominance   Dominant Hand: Right    Extremity/Trunk Assessment   Upper Extremity Assessment RUE Deficits / Details: previous bicep tendon rupture - pt states he has limited elbow extension from this.     Lower Extremity Assessment Lower Extremity Assessment: LLE deficits/detail LLE Deficits / Details: Grossly 3/5       Communication   Communication: No difficulties  Cognition Arousal/Alertness: Awake/alert Behavior During Therapy: WFL for tasks assessed/performed Overall Cognitive Status: Within Functional Limits for tasks assessed  General Comments      Exercises Total Joint Exercises Ankle Circles/Pumps: AROM;Both;20 reps;Supine Quad Sets: Strengthening;Both;10 reps;Supine Gluteal Sets: Strengthening;10 reps;Both;Supine Short Arc Quad: Strengthening;Left;10 reps;Supine Heel Slides: Strengthening;Left;10 reps;Supine;Limitations Heel Slides Limitations: Pt requires assistance to initiate exercises for the first 3 reps, and ROM is limited  due to pain.    Assessment/Plan    PT Assessment Patient needs continued PT services  PT Problem List Decreased strength;Decreased range of motion;Decreased activity tolerance;Decreased balance;Decreased mobility;Decreased knowledge of use of DME;Pain       PT Treatment Interventions DME instruction;Gait training;Stair training;Functional mobility training;Therapeutic activities;Therapeutic exercise;Balance training;Patient/family education    PT Goals (Current goals can be found in the Care Plan section)  Acute Rehab PT Goals Patient Stated Goal: To go home PT Goal Formulation: With patient Time For Goal Achievement: 03/17/17 Potential to Achieve Goals: Good    Frequency BID   Barriers to discharge        Co-evaluation               End of Session Equipment Utilized During Treatment: Gait belt Activity Tolerance: Patient tolerated treatment well Patient left: in chair;with call bell/phone within reach Nurse Communication: Mobility status;Patient requests pain meds (Tamika, RN aware of pt's mobility status. ) PT Visit Diagnosis: Muscle weakness (generalized) (M62.81);Other abnormalities of gait and mobility (R26.89)    Time: 6759-1638 PT Time Calculation (min) (ACUTE ONLY): 40 min   Charges:   PT Evaluation $PT Eval Low Complexity: 1 Procedure PT Treatments $Gait Training: 8-22 mins $Therapeutic Exercise: 8-22 mins   PT G Codes:   PT G-Codes **NOT FOR INPATIENT CLASS** Functional Assessment Tool Used: Clinical judgement;AM-PAC 6 Clicks Basic Mobility Functional Limitation: Mobility: Walking and moving around Mobility: Walking and Moving Around Current Status (G6659): At least 20 percent but less than 40 percent impaired, limited or restricted Mobility: Walking and Moving Around Goal Status [id number]): At least 1 percent but less than 20 percent impaired, limited or restricted    Beth Pal Shell, PT, DPT X: [phone]

## 2017-03-10 NOTE — Progress Notes (Signed)
Pts blood sugar 589, RN called and made Dr. Aline Brochure aware. New order for 8 units of insulin Novolog. No new orders to recheck blood sugar. Dr. Aline Brochure stated he would order a medical consult in the am.

## 2017-03-10 NOTE — Progress Notes (Signed)
LCSW received consult for SNF placement/disposition.  Patient is doing well per PT and safe to return home with wife and home health. No CSW interventions warranted at this time.  Available if needs arise.  Will sign off.  Lane Hacker, MSW Clinical Social Work: [employer] Coverage for :  [phone]

## 2017-03-11 LAB — CBC
HCT: 29.7 % — ABNORMAL LOW (ref 39.0–52.0)
Hemoglobin: 9.9 g/dL — ABNORMAL LOW (ref 13.0–17.0)
MCH: 30.2 pg (ref 26.0–34.0)
MCHC: 33.3 g/dL (ref 30.0–36.0)
MCV: 90.5 fL (ref 78.0–100.0)
Platelets: 84 10*3/uL — ABNORMAL LOW (ref 150–400)
RBC: 3.28 MIL/uL — ABNORMAL LOW (ref 4.22–5.81)
RDW: 13 % (ref 11.5–15.5)
WBC: 6.3 10*3/uL (ref 4.0–10.5)

## 2017-03-11 LAB — GLUCOSE, CAPILLARY
Glucose-Capillary: 379 mg/dL — ABNORMAL HIGH (ref 65–99)
Glucose-Capillary: 294 mg/dL — ABNORMAL HIGH (ref 65–99)
Glucose-Capillary: 283 mg/dL — ABNORMAL HIGH (ref 65–99)
Glucose-Capillary: 249 mg/dL — ABNORMAL HIGH (ref 65–99)

## 2017-03-11 NOTE — Progress Notes (Addendum)
Inpatient Diabetes Program Recommendations  AACE/ADA: New Consensus Statement on Inpatient Glycemic Control (2015)  Target Ranges:  Prepandial:   less than 140 mg/dL      Peak postprandial:   less than 180 mg/dL (1-2 hours)      Critically ill patients:  140 - 180 mg/dL   Results for [name], [name] (MRN [id number]) as of 03/11/2017 07:32  Ref. Range 03/10/2017 08:40 03/10/2017 11:21 03/10/2017 16:33 03/10/2017 21:40 03/10/2017 23:59  Glucose-Capillary Latest Ref Range: 65 - 99 mg/dL 316 (H) 423 (H) 459 (H) 568 (HH) 473 (H)    Home DM Meds: Kombiglyze 2.04/999 mg BID       Humalog 75/25 Insulin- 70 units BID  Current Insulin Orders: Novolog Sensitive Correction Scale/ SSI (0-9 units) TID AC        MD- Patient eating 50-90% of meals.  Needs at least a portion of his home insulin added back.  Also note patient received 10 mg Decadron yesterday at 10am.  Please consider the following:  1. Start 70/30 Insulin- 50 units BID with meals (this would be about 75% total home dose to start)  2. Increase Novolog SSI to Moderate scale (0-15 units) TID AC + HS     Addendum 12pm- Bretta Bang, RN caring for pt today.  Reviewed CBGs with RN.  Asked RN to please page MD and ask for orders for 70/30 Insulin (please reference DM Coordinator note).  Will Follow.     --Will follow patient during hospitalization--  Wyn Quaker RN, MSN, CDE Diabetes Coordinator Inpatient Glycemic Control Team Team Pager: [phone] (8a-5p)

## 2017-03-11 NOTE — Progress Notes (Signed)
Subjective: 2 Days Post-Op Procedure(s) (LRB): TOTAL KNEE ARTHROPLASTY (Left) Patient reports pain as 4 on 0-10 scale.    Objective: Vital signs in last 24 hours: Temp:  [97.8 F (36.6 C)] 97.8 F (36.6 C) (03/30 0639) Pulse Rate:  [79-102] 79 (03/30 0639) Resp:  [18] 18 (03/30 0639) BP: (105-145)/(64-83) 105/64 (03/30 0639) SpO2:  [94 %-96 %] 95 % (03/30 0639)  Intake/Output from previous day: 03/29 0701 - 03/30 0700 In: 1290 [P.O.:1290] Out: 2156 [Urine:1701; Drains:455] Intake/Output this shift: Total I/O In: 240 [P.O.:240] Out: 200 [Urine:200]   Recent Labs  03/10/17 0441 03/11/17 0523  HGB 10.9* 9.9*    Recent Labs  03/10/17 0441 03/11/17 0523  WBC 5.1 6.3  RBC 3.51* 3.28*  HCT 31.8* 29.7*  PLT 90* 84*    Recent Labs  03/10/17 0441 03/10/17 1754  NA 131*  --   K 3.8  --   CL 98*  --   CO2 26  --   BUN 14  --   CREATININE 1.19  --   GLUCOSE 283* 489*  CALCIUM 8.3*  --    No results for input(s): LABPT, INR in the last 72 hours.  Dressing was changed today. The dressing was clean dry and intact. Minimal swelling. Calf is soft and supple. Homans sign was negative on both sides.  The glucose is now much better controlled.  However we would like it under better control so we will be here for another 24 hours while we work on his hyperglycemia  Assessment/Plan: 2 Days Post-Op Procedure(s) (LRB): TOTAL KNEE ARTHROPLASTY (Left) Up with therapy Plan for discharge tomorrow  Arther Abbott 03/11/2017, 10:21 AM

## 2017-03-11 NOTE — Progress Notes (Signed)
qPhysical Therapy Treatment Patient Details Name: KNOLAN SIMIEN MRN: [id number] DOB: 1961-04-03 Today's Date: 03/11/2017    History of Present Illness 56 yo M s/p L TKA    PT Comments    Pt pleasant and wiling to participate with therapist this afternoon.  Session focus on improving knee mobility and open chain exercises for strengtheing.  Pt able to demonstrate appropriate form and technique without cueing required and reports compliance with HEP multiple times daily.  Progressed to STS for functional strengthening and began static balance activities for safety with min A required.  AROM for knee flexion at  70 degrees at EOS.  Left on CPM  At 80 degrees flexion comfortably with call bell within reach and ice applied to knee for pain and edema control.      Follow Up Recommendations  Home health PT;Supervision/Assistance - 24 hour     Equipment Recommendations  Rolling walker with 5" wheels    Recommendations for Other Services       Precautions / Restrictions Precautions Precautions: None Precaution Comments: due to recent surgery Restrictions Weight Bearing Restrictions: Yes    Mobility  Bed Mobility Overal bed mobility: Independent             General bed mobility comments: Independent bed mobility   Transfers Overall transfer level: Independent Equipment used: Rolling walker (2 wheeled) Transfers: Sit to/from Stand Sit to Stand: Supervision         General transfer comment: VC for hand placement, 5 reps for strengthening  Ambulation/Gait Ambulation/Gait assistance: Modified independent (Device/Increase time) Ambulation Distance (Feet): 200 Feet Assistive device: Rolling walker (2 wheeled) Gait Pattern/deviations: Step-through pattern;Antalgic;Decreased step length - right;Decreased step length - left         Stairs Stairs: Yes   Stair Management: One rail Right;Forwards;Step to pattern Number of Stairs: 10    Wheelchair Mobility     Modified Rankin (Stroke Patients Only)       Balance Overall balance assessment: Needs assistance Sitting-balance support: No upper extremity supported Sitting balance-Leahy Scale: Normal     Standing balance support: Bilateral upper extremity supported Standing balance-Leahy Scale: Fair                              Cognition Arousal/Alertness: Awake/alert Behavior During Therapy: WFL for tasks assessed/performed Overall Cognitive Status: Within Functional Limits for tasks assessed                                        Exercises Total Joint Exercises Ankle Circles/Pumps: AROM;Both;Supine;10 reps Quad Sets: Strengthening;Both;10 reps;Supine Gluteal Sets: Strengthening;10 reps;Both;Supine Short Arc Quad: Strengthening;Left;10 reps;Supine Heel Slides: Strengthening;Left;10 reps;Supine;Limitations Heel Slides Limitations: AAROM 70 degrees; pt left with CPM to80 degrees flexion Straight Leg Raises: Strengthening;Left;5 reps;Supine Other Exercises Other Exercises: 5 STS for strengthening Other Exercises: NBOS 2x 30" Other Exercises: Tandem stance 2x 30" wiht min A for safety    General Comments        Pertinent Vitals/Pain Pain Assessment: 0-10 Pain Score: 6  Pain Location: L knee Pain Descriptors / Indicators: Tightness Pain Intervention(s): Monitored during session;Repositioned;Ice applied    Home Living                      Prior Function            PT Goals (current goals  can now be found in the care plan section) Acute Rehab PT Goals Patient Stated Goal: To go home PT Goal Formulation: With patient Time For Goal Achievement: 03/17/17 Potential to Achieve Goals: Good Progress towards PT goals: Progressing toward goals    Frequency    BID      PT Plan Current plan remains appropriate    Co-evaluation             End of Session Equipment Utilized During Treatment: Gait belt Activity Tolerance: Patient  tolerated treatment well Patient left: in chair;with call bell/phone within reach;with family/visitor present Nurse Communication: Mobility status PT Visit Diagnosis: Muscle weakness (generalized) (M62.81);Other abnormalities of gait and mobility (R26.89)     Time: 2956-2130 PT Time Calculation (min) (ACUTE ONLY): 38 min  Charges:  $Gait Training: 8-22 mins $Therapeutic Exercise: 8-22 mins $Therapeutic Activity: 8-22 mins                    G Codes:       Ihor Austin, LPTA; CBIS [phone]    Aldona Lento 03/11/2017, 7:06 PM

## 2017-03-11 NOTE — Progress Notes (Signed)
qPhysical Therapy Treatment Patient Details Name: [name] MRN: [id number] DOB: [birth year] Today's Date: 03/11/2017    History of Present Illness 56 yo M s/p L TKA    PT Comments    Pt received sitting up in the chair, but was able to transfer into the bed to perform exercises with good technique, and then ambulated 281ft with RW Mod (I), and neogitated 1 flight of steps Mod (I).  Pt is cleared to d/c home with HHPT.     Follow Up Recommendations  Home health PT;Supervision/Assistance - 24 hour     Equipment Recommendations  Rolling walker with 5" wheels    Recommendations for Other Services       Precautions / Restrictions Precautions Precautions: None Precaution Comments: due to recent surgery Restrictions Weight Bearing Restrictions: Yes    Mobility  Bed Mobility Overal bed mobility: Independent             General bed mobility comments: Independent bed mobility   Transfers Overall transfer level: Independent Equipment used: Rolling walker (2 wheeled) Transfers: Sit to/from Stand Sit to Stand: Supervision         General transfer comment: VC for hand placement, 5 reps for strengthening  Ambulation/Gait Ambulation/Gait assistance: Modified independent (Device/Increase time) Ambulation Distance (Feet): 200 Feet Assistive device: Rolling walker (2 wheeled) Gait Pattern/deviations: Step-through pattern;Antalgic;Decreased step length - right;Decreased step length - left         Stairs Stairs: Yes   Stair Management: One rail Right;Forwards;Step to pattern Number of Stairs: 10    Wheelchair Mobility    Modified Rankin (Stroke Patients Only)       Balance Overall balance assessment: Needs assistance Sitting-balance support: No upper extremity supported Sitting balance-Leahy Scale: Normal     Standing balance support: Bilateral upper extremity supported Standing balance-Leahy Scale: Fair                               Cognition Arousal/Alertness: Awake/alert Behavior During Therapy: WFL for tasks assessed/performed Overall Cognitive Status: Within Functional Limits for tasks assessed                                        Exercises Total Joint Exercises Ankle Circles/Pumps: AROM;Both;Supine;10 reps Quad Sets: Strengthening;Both;10 reps;Supine Gluteal Sets: Strengthening;10 reps;Both;Supine Short Arc Quad: Strengthening;Left;10 reps;Supine Heel Slides: Strengthening;Left;10 reps;Supine;Limitations Heel Slides Limitations: AAROM 70 degrees; pt left with CPM to80 degrees flexion Straight Leg Raises: Strengthening;Left;5 reps;Supine    General Comments        Pertinent Vitals/Pain Pain Assessment: 0-10 Pain Score: 6  Pain Location: L knee Pain Descriptors / Indicators: Tightness Pain Intervention(s): Monitored during session;Repositioned;Ice applied    Home Living                      Prior Function            PT Goals (current goals can now be found in the care plan section) Acute Rehab PT Goals Patient Stated Goal: To go home PT Goal Formulation: With patient Time For Goal Achievement: 03/17/17 Potential to Achieve Goals: Good Progress towards PT goals: Progressing toward goals    Frequency    BID      PT Plan Current plan remains appropriate    Co-evaluation  End of Session Equipment Utilized During Treatment: Gait belt Activity Tolerance: Patient tolerated treatment well Patient left: in chair;with call bell/phone within reach;with family/visitor present Nurse Communication: Mobility status PT Visit Diagnosis: Muscle weakness (generalized) (M62.81);Other abnormalities of gait and mobility (R26.89)     Time: 2174-7159 PT Time Calculation (min) (ACUTE ONLY): 31 min  Charges:  $Gait Training: 8-22 mins $Therapeutic Exercise: 8-22 mins                    G Codes:       Beth Tiffanie Blassingame, PT, DPT X: [phone]

## 2017-03-12 ENCOUNTER — Encounter (HOSPITAL_COMMUNITY): Payer: Self-pay | Admitting: Orthopedic Surgery

## 2017-03-12 LAB — BPAM RBC
Blood Product Expiration Date: 201804132359
Blood Product Expiration Date: 201804132359
Unit Type and Rh: 6200
Unit Type and Rh: 6200

## 2017-03-12 LAB — CBC
HCT: 33.9 % — ABNORMAL LOW (ref 39.0–52.0)
Hemoglobin: 11.3 g/dL — ABNORMAL LOW (ref 13.0–17.0)
MCH: 30.5 pg (ref 26.0–34.0)
MCHC: 33.3 g/dL (ref 30.0–36.0)
MCV: 91.4 fL (ref 78.0–100.0)
Platelets: 132 10*3/uL — ABNORMAL LOW (ref 150–400)
RBC: 3.71 MIL/uL — ABNORMAL LOW (ref 4.22–5.81)
RDW: 14.2 % (ref 11.5–15.5)
WBC: 8.4 10*3/uL (ref 4.0–10.5)

## 2017-03-12 LAB — TYPE AND SCREEN
ABO/RH(D): A POS
Antibody Screen: NEGATIVE
Unit division: 0
Unit division: 0

## 2017-03-12 LAB — GLUCOSE, CAPILLARY
Glucose-Capillary: 116 mg/dL — ABNORMAL HIGH (ref 65–99)
Glucose-Capillary: 237 mg/dL — ABNORMAL HIGH (ref 65–99)

## 2017-03-12 MED ORDER — HYDROCODONE-ACETAMINOPHEN 7.5-325 MG PO TABS: 1.0000 | ORAL_TABLET | Freq: Four times a day (QID) | ORAL | 0 refills | Status: DC

## 2017-03-12 MED ORDER — DOCUSATE SODIUM 100 MG PO CAPS: 100.0000 mg | ORAL_CAPSULE | Freq: Two times a day (BID) | ORAL | 0 refills | Status: DC

## 2017-03-12 MED ORDER — METHOCARBAMOL 500 MG PO TABS: 500.0000 mg | ORAL_TABLET | Freq: Four times a day (QID) | ORAL | 1 refills | Status: DC | PRN

## 2017-03-12 NOTE — Progress Notes (Signed)
Discharged home with instructions given to patient and wife regarding follow up visits and medications, both verbalized understanding discharge instructions. Prescription sent with patient,and also some medications were sent to Pharmacy of choice documented on AVS. Accompanied by staff to an awaiting vehicle.

## 2017-03-12 NOTE — Progress Notes (Signed)
Patient's IV came out this morning at 0530--called Dr. Aline Brochure, per doctor's order there is no need to restart this IV; as patient is most likely going home today.

## 2017-03-12 NOTE — Discharge Summary (Signed)
Physician Discharge Summary  Patient ID: [name] MRN: [id number] DOB/AGE: [birth year] 56 y.o.  Admit date: 03/09/2017 Discharge date: 03/12/2017  Admission Diagnoses: OA LEFT KNEE   Discharge Diagnoses: OA LEFT KNEE                                          HYPERGLYCEMIA  Active Problems:   Primary osteoarthritis of left knee   Discharged Condition: stable  Hospital Course:  HD 1 TKA SPINAL ANESTHESIA UNCOMPLICATED ; DEPUY FB PS TKA 66F 4T 12.5 POLY 41 P HD 2 HYPERGLYCEMIA INDUCED BY POST OP STEROIDS ; TREATED WITH IV INSULIN, STILL AMBULATED 100 FT  HD 3 GLU 116 THIS AM   CBC Latest Ref Rng & Units 03/12/2017 03/11/2017 03/10/2017  WBC 4.0 - 10.5 K/uL 8.4 6.3 5.1  Hemoglobin 13.0 - 17.0 g/dL 11.3(L) 9.9(L) 10.9(L)  Hematocrit 39.0 - 52.0 % 33.9(L) 29.7(L) 31.8(L)  Platelets 150 - 400 K/uL 132(L) 84(L) 90(L)   BMP Latest Ref Rng & Units 03/10/2017 03/10/2017 03/04/2017  Glucose 65 - 99 mg/dL 489(H) 283(H) 411(H)  BUN 6 - 20 mg/dL - 14 12  Creatinine 0.61 - 1.24 mg/dL - 1.19 1.16  Sodium 135 - 145 mmol/L - 131(L) 131(L)  Potassium 3.5 - 5.1 mmol/L - 3.8 3.8  Chloride 101 - 111 mmol/L - 98(L) 98(L)  CO2 22 - 32 mmol/L - 26 23  Calcium 8.9 - 10.3 mg/dL - 8.3(L) 8.9   Discharge Exam: Blood pressure 115/67, pulse 84, temperature 98.3 F (36.8 C), temperature source Oral, resp. rate 18, height 5\' 10"  (1.778 m), weight 271 lb 2.7 oz (123 kg), SpO2 98 %. The patient awake alert and oriented 3 mood and affect are normal knee incision is clean dry and intact without erythema calf is supple Homans sign is negative  Disposition: 01-Home or Self Care  Discharge Instructions    CPM    Complete by:  As directed    Continuous passive motion machine (CPM):      Use the CPM from 0- to 75 for 6 hours per day.      You may increase by 10 per day.  You may break it up into 2 or 3 sessions per day.      Use CPM for 2 weeks or until you are told to stop.   Call MD / Call 911     Complete by:  As directed    If you experience chest pain or shortness of breath, CALL 911 and be transported to the hospital emergency room.  If you develope a fever above 101 F, pus (white drainage) or increased drainage or redness at the wound, or calf pain, call your surgeon's office.   Change dressing    Complete by:  As directed    Do not Change dressing   Constipation Prevention    Complete by:  As directed    Drink plenty of fluids.  Prune juice may be helpful.  You may use a stool softener, such as Colace (over the counter) 100 mg twice a day.  Use MiraLax (over the counter) for constipation as needed.   Diet - low sodium heart healthy    Complete by:  As directed    Do not put a pillow under the knee. Place it under the heel.    Complete by:  As directed    Driving restrictions  Complete by:  As directed    No driving for 2 weeks   Increase activity slowly as tolerated    Complete by:  As directed    TED hose    Complete by:  As directed    Use stockings (TED hose) for 2 weeks on both leg(s).  You may remove them at night for sleeping.     Allergies as of 03/12/2017   No Known Allergies     Medication List    STOP taking these medications   azithromycin 250 MG tablet Commonly known as:  ZITHROMAX   traMADol 50 MG tablet Commonly known as:  ULTRAM     TAKE these medications   aspirin 325 MG EC tablet Take 1 tablet (325 mg total) by mouth daily.   docusate sodium 100 MG capsule Commonly known as:  COLACE Take 1 capsule (100 mg total) by mouth 2 (two) times daily.   furosemide 20 MG tablet Commonly known as:  LASIX Take 20 mg by mouth daily.   Garlic 1245 MG Caps Take 1,000 mg by mouth daily.   hydrochlorothiazide 25 MG tablet Commonly known as:  HYDRODIURIL 12.5 mg  twice daily   HYDROcodone-acetaminophen 7.5-325 MG tablet Commonly known as:  NORCO Take 1 tablet by mouth every 6 (six) hours.   insulin lispro protamine-lispro (75-25) 100 UNIT/ML  Susp injection Commonly known as:  HUMALOG 75/25 MIX Inject 70 Units into the skin 2 (two) times daily with a meal.   KOMBIGLYZE XR 2.04-999 MG Tb24 Generic drug:  Saxagliptin-Metformin Take 1 tablet by mouth 2 (two) times daily.   loratadine 10 MG tablet Commonly known as:  CLARITIN Take 10 mg by mouth daily.   meloxicam 15 MG tablet Commonly known as:  MOBIC Take 15 mg by mouth daily.   methocarbamol 500 MG tablet Commonly known as:  ROBAXIN Take 1 tablet (500 mg total) by mouth every 6 (six) hours as needed for muscle spasms.   metoprolol tartrate 25 MG tablet Commonly known as:  LOPRESSOR Take 12.5 mg by mouth 2 (two) times daily.   olmesartan 40 MG tablet Commonly known as:  BENICAR Take 1 tablet (40 mg total) by mouth daily.   omeprazole 40 MG capsule Commonly known as:  PRILOSEC Take 40 mg by mouth daily. What changed:  Another medication with the same name was removed. Continue taking this medication, and follow the directions you see here.   pravastatin 20 MG tablet Commonly known as:  PRAVACHOL TAKE 1 TABLET(20 MG) BY MOUTH AT BEDTIME   TURMERIC CURCUMIN PO Take 1 tablet by mouth daily.            Durable Medical Equipment        Start     Ordered   03/10/17 1058  For home use only DME Walker rolling  Once    Question:  Patient needs a walker to treat with the following condition  Answer:  Osteoarthritis of left knee   03/10/17 1058     Follow-up Information    Arther Abbott, MD Follow up.   Specialties:  Orthopedic Surgery, Radiology Contact information: [address] [place] [address] [phone]           Signed: Arther Abbott 03/12/2017, 8:11 AM

## 2017-03-12 NOTE — Progress Notes (Signed)
Patient had uneventful night.  Patient rested well.  Patient complained of severe pain once and was given PRN medication.

## 2017-03-13 DIAGNOSIS — I5022 Chronic systolic (congestive) heart failure: Secondary | ICD-10-CM | POA: Diagnosis not present

## 2017-03-13 DIAGNOSIS — Z7982 Long term (current) use of aspirin: Secondary | ICD-10-CM | POA: Diagnosis not present

## 2017-03-13 DIAGNOSIS — I110 Hypertensive heart disease with heart failure: Secondary | ICD-10-CM | POA: Diagnosis not present

## 2017-03-13 DIAGNOSIS — G4733 Obstructive sleep apnea (adult) (pediatric): Secondary | ICD-10-CM | POA: Diagnosis not present

## 2017-03-13 DIAGNOSIS — Z791 Long term (current) use of non-steroidal anti-inflammatories (NSAID): Secondary | ICD-10-CM | POA: Diagnosis not present

## 2017-03-13 DIAGNOSIS — Z471 Aftercare following joint replacement surgery: Secondary | ICD-10-CM | POA: Diagnosis not present

## 2017-03-13 DIAGNOSIS — Z794 Long term (current) use of insulin: Secondary | ICD-10-CM | POA: Diagnosis not present

## 2017-03-13 DIAGNOSIS — T07 Unspecified multiple injuries: Secondary | ICD-10-CM | POA: Diagnosis not present

## 2017-03-13 DIAGNOSIS — Z96652 Presence of left artificial knee joint: Secondary | ICD-10-CM | POA: Diagnosis not present

## 2017-03-13 DIAGNOSIS — I2510 Atherosclerotic heart disease of native coronary artery without angina pectoris: Secondary | ICD-10-CM | POA: Diagnosis not present

## 2017-03-13 DIAGNOSIS — E1165 Type 2 diabetes mellitus with hyperglycemia: Secondary | ICD-10-CM | POA: Diagnosis not present

## 2017-03-13 DIAGNOSIS — E785 Hyperlipidemia, unspecified: Secondary | ICD-10-CM | POA: Diagnosis not present

## 2017-03-15 DIAGNOSIS — E1165 Type 2 diabetes mellitus with hyperglycemia: Secondary | ICD-10-CM | POA: Diagnosis not present

## 2017-03-15 DIAGNOSIS — Z791 Long term (current) use of non-steroidal anti-inflammatories (NSAID): Secondary | ICD-10-CM | POA: Diagnosis not present

## 2017-03-15 DIAGNOSIS — I110 Hypertensive heart disease with heart failure: Secondary | ICD-10-CM | POA: Diagnosis not present

## 2017-03-15 DIAGNOSIS — Z794 Long term (current) use of insulin: Secondary | ICD-10-CM | POA: Diagnosis not present

## 2017-03-15 DIAGNOSIS — T07 Unspecified multiple injuries: Secondary | ICD-10-CM | POA: Diagnosis not present

## 2017-03-15 DIAGNOSIS — Z7982 Long term (current) use of aspirin: Secondary | ICD-10-CM | POA: Diagnosis not present

## 2017-03-15 DIAGNOSIS — I5022 Chronic systolic (congestive) heart failure: Secondary | ICD-10-CM | POA: Diagnosis not present

## 2017-03-15 DIAGNOSIS — E785 Hyperlipidemia, unspecified: Secondary | ICD-10-CM | POA: Diagnosis not present

## 2017-03-15 DIAGNOSIS — I2510 Atherosclerotic heart disease of native coronary artery without angina pectoris: Secondary | ICD-10-CM | POA: Diagnosis not present

## 2017-03-15 DIAGNOSIS — Z96652 Presence of left artificial knee joint: Secondary | ICD-10-CM | POA: Diagnosis not present

## 2017-03-15 DIAGNOSIS — Z471 Aftercare following joint replacement surgery: Secondary | ICD-10-CM | POA: Diagnosis not present

## 2017-03-15 DIAGNOSIS — G4733 Obstructive sleep apnea (adult) (pediatric): Secondary | ICD-10-CM | POA: Diagnosis not present

## 2017-03-17 DIAGNOSIS — Z7982 Long term (current) use of aspirin: Secondary | ICD-10-CM | POA: Diagnosis not present

## 2017-03-17 DIAGNOSIS — Z791 Long term (current) use of non-steroidal anti-inflammatories (NSAID): Secondary | ICD-10-CM | POA: Diagnosis not present

## 2017-03-17 DIAGNOSIS — T07 Unspecified multiple injuries: Secondary | ICD-10-CM | POA: Diagnosis not present

## 2017-03-17 DIAGNOSIS — I5022 Chronic systolic (congestive) heart failure: Secondary | ICD-10-CM | POA: Diagnosis not present

## 2017-03-17 DIAGNOSIS — I110 Hypertensive heart disease with heart failure: Secondary | ICD-10-CM | POA: Diagnosis not present

## 2017-03-17 DIAGNOSIS — Z794 Long term (current) use of insulin: Secondary | ICD-10-CM | POA: Diagnosis not present

## 2017-03-17 DIAGNOSIS — E785 Hyperlipidemia, unspecified: Secondary | ICD-10-CM | POA: Diagnosis not present

## 2017-03-17 DIAGNOSIS — Z471 Aftercare following joint replacement surgery: Secondary | ICD-10-CM | POA: Diagnosis not present

## 2017-03-17 DIAGNOSIS — Z96652 Presence of left artificial knee joint: Secondary | ICD-10-CM | POA: Diagnosis not present

## 2017-03-17 DIAGNOSIS — G4733 Obstructive sleep apnea (adult) (pediatric): Secondary | ICD-10-CM | POA: Diagnosis not present

## 2017-03-17 DIAGNOSIS — E1165 Type 2 diabetes mellitus with hyperglycemia: Secondary | ICD-10-CM | POA: Diagnosis not present

## 2017-03-17 DIAGNOSIS — I2510 Atherosclerotic heart disease of native coronary artery without angina pectoris: Secondary | ICD-10-CM | POA: Diagnosis not present

## 2017-03-21 DIAGNOSIS — Z791 Long term (current) use of non-steroidal anti-inflammatories (NSAID): Secondary | ICD-10-CM | POA: Diagnosis not present

## 2017-03-21 DIAGNOSIS — I110 Hypertensive heart disease with heart failure: Secondary | ICD-10-CM | POA: Diagnosis not present

## 2017-03-21 DIAGNOSIS — Z794 Long term (current) use of insulin: Secondary | ICD-10-CM | POA: Diagnosis not present

## 2017-03-21 DIAGNOSIS — G4733 Obstructive sleep apnea (adult) (pediatric): Secondary | ICD-10-CM | POA: Diagnosis not present

## 2017-03-21 DIAGNOSIS — T07 Unspecified multiple injuries: Secondary | ICD-10-CM | POA: Diagnosis not present

## 2017-03-21 DIAGNOSIS — Z96652 Presence of left artificial knee joint: Secondary | ICD-10-CM | POA: Diagnosis not present

## 2017-03-21 DIAGNOSIS — E785 Hyperlipidemia, unspecified: Secondary | ICD-10-CM | POA: Diagnosis not present

## 2017-03-21 DIAGNOSIS — E1165 Type 2 diabetes mellitus with hyperglycemia: Secondary | ICD-10-CM | POA: Diagnosis not present

## 2017-03-21 DIAGNOSIS — Z471 Aftercare following joint replacement surgery: Secondary | ICD-10-CM | POA: Diagnosis not present

## 2017-03-21 DIAGNOSIS — I2510 Atherosclerotic heart disease of native coronary artery without angina pectoris: Secondary | ICD-10-CM | POA: Diagnosis not present

## 2017-03-21 DIAGNOSIS — I5022 Chronic systolic (congestive) heart failure: Secondary | ICD-10-CM | POA: Diagnosis not present

## 2017-03-21 DIAGNOSIS — Z7982 Long term (current) use of aspirin: Secondary | ICD-10-CM | POA: Diagnosis not present

## 2017-03-22 ENCOUNTER — Other Ambulatory Visit: Payer: Self-pay | Admitting: Adult Health

## 2017-03-22 DIAGNOSIS — Z471 Aftercare following joint replacement surgery: Secondary | ICD-10-CM | POA: Diagnosis not present

## 2017-03-22 DIAGNOSIS — I2510 Atherosclerotic heart disease of native coronary artery without angina pectoris: Secondary | ICD-10-CM | POA: Diagnosis not present

## 2017-03-22 DIAGNOSIS — I5022 Chronic systolic (congestive) heart failure: Secondary | ICD-10-CM | POA: Diagnosis not present

## 2017-03-22 DIAGNOSIS — G4733 Obstructive sleep apnea (adult) (pediatric): Secondary | ICD-10-CM | POA: Diagnosis not present

## 2017-03-22 DIAGNOSIS — T07 Unspecified multiple injuries: Secondary | ICD-10-CM | POA: Diagnosis not present

## 2017-03-22 DIAGNOSIS — Z794 Long term (current) use of insulin: Secondary | ICD-10-CM | POA: Diagnosis not present

## 2017-03-22 DIAGNOSIS — Z791 Long term (current) use of non-steroidal anti-inflammatories (NSAID): Secondary | ICD-10-CM | POA: Diagnosis not present

## 2017-03-22 DIAGNOSIS — Z7982 Long term (current) use of aspirin: Secondary | ICD-10-CM | POA: Diagnosis not present

## 2017-03-22 DIAGNOSIS — E785 Hyperlipidemia, unspecified: Secondary | ICD-10-CM | POA: Diagnosis not present

## 2017-03-22 DIAGNOSIS — Z96652 Presence of left artificial knee joint: Secondary | ICD-10-CM | POA: Diagnosis not present

## 2017-03-22 DIAGNOSIS — I110 Hypertensive heart disease with heart failure: Secondary | ICD-10-CM | POA: Diagnosis not present

## 2017-03-22 DIAGNOSIS — E1165 Type 2 diabetes mellitus with hyperglycemia: Secondary | ICD-10-CM | POA: Diagnosis not present

## 2017-03-23 ENCOUNTER — Other Ambulatory Visit: Payer: Self-pay | Admitting: Orthopedic Surgery

## 2017-03-23 ENCOUNTER — Telehealth: Payer: Self-pay | Admitting: Orthopedic Surgery

## 2017-03-23 MED ORDER — HYDROCODONE-ACETAMINOPHEN 7.5-325 MG PO TABS: 1.0000 | ORAL_TABLET | Freq: Four times a day (QID) | ORAL | 0 refills | Status: DC

## 2017-03-23 NOTE — Telephone Encounter (Signed)
Patient requests refill on Hydrocodone/Acetaminophen 7.5-325 mgs.   Qty  30       Sig: Take 1 tablet by mouth every 6 (six) hours.

## 2017-03-23 NOTE — Progress Notes (Signed)
[place] controlled substance reporting system reviewed  

## 2017-03-23 NOTE — Telephone Encounter (Signed)
Routing to Dr Harrison for approval 

## 2017-03-24 DIAGNOSIS — I2510 Atherosclerotic heart disease of native coronary artery without angina pectoris: Secondary | ICD-10-CM | POA: Diagnosis not present

## 2017-03-24 DIAGNOSIS — I5022 Chronic systolic (congestive) heart failure: Secondary | ICD-10-CM | POA: Diagnosis not present

## 2017-03-24 DIAGNOSIS — T07 Unspecified multiple injuries: Secondary | ICD-10-CM | POA: Diagnosis not present

## 2017-03-24 DIAGNOSIS — E1165 Type 2 diabetes mellitus with hyperglycemia: Secondary | ICD-10-CM | POA: Diagnosis not present

## 2017-03-24 DIAGNOSIS — E785 Hyperlipidemia, unspecified: Secondary | ICD-10-CM | POA: Diagnosis not present

## 2017-03-24 DIAGNOSIS — G4733 Obstructive sleep apnea (adult) (pediatric): Secondary | ICD-10-CM | POA: Diagnosis not present

## 2017-03-24 DIAGNOSIS — Z791 Long term (current) use of non-steroidal anti-inflammatories (NSAID): Secondary | ICD-10-CM | POA: Diagnosis not present

## 2017-03-24 DIAGNOSIS — Z7982 Long term (current) use of aspirin: Secondary | ICD-10-CM | POA: Diagnosis not present

## 2017-03-24 DIAGNOSIS — Z794 Long term (current) use of insulin: Secondary | ICD-10-CM | POA: Diagnosis not present

## 2017-03-24 DIAGNOSIS — Z471 Aftercare following joint replacement surgery: Secondary | ICD-10-CM | POA: Diagnosis not present

## 2017-03-24 DIAGNOSIS — Z96652 Presence of left artificial knee joint: Secondary | ICD-10-CM | POA: Diagnosis not present

## 2017-03-24 DIAGNOSIS — I110 Hypertensive heart disease with heart failure: Secondary | ICD-10-CM | POA: Diagnosis not present

## 2017-03-25 ENCOUNTER — Encounter: Payer: Self-pay | Admitting: Orthopedic Surgery

## 2017-03-25 ENCOUNTER — Ambulatory Visit (INDEPENDENT_AMBULATORY_CARE_PROVIDER_SITE_OTHER): Payer: Federal, State, Local not specified - PPO | Admitting: Orthopedic Surgery

## 2017-03-25 DIAGNOSIS — G4700 Insomnia, unspecified: Secondary | ICD-10-CM

## 2017-03-25 DIAGNOSIS — Z4889 Encounter for other specified surgical aftercare: Secondary | ICD-10-CM

## 2017-03-25 DIAGNOSIS — Z96652 Presence of left artificial knee joint: Secondary | ICD-10-CM

## 2017-03-25 MED ORDER — DIPHENHYDRAMINE HCL 50 MG PO TABS: 50.0000 mg | ORAL_TABLET | Freq: Every evening | ORAL | 0 refills | Status: DC | PRN

## 2017-03-25 NOTE — Patient Instructions (Signed)
Work on Air traffic controller PT

## 2017-03-25 NOTE — Progress Notes (Addendum)
Post op   Chief Complaint  Patient presents with  . Follow-up    POST OP 1, LT TKA, DOS 03/09/17    Pod 16   Patient is doing well but he is having trouble sleeping at night. His range of motion is currently measured at minus 10-95 degrees and he demonstrated that for me in the office. I've asked him to work on long extension exercises in a chair  His wound looked clean dry and intact he had no swelling of his knee or his ankle. He walks without any assistive devices with a slight limp  He will get some Benadryl for sleeping 50 mg at night and come back and see me in 4 weeks  Encounter Diagnoses  Name Primary?  [name] Aftercare following surgery   . Status post total left knee replacement   . Insomnia, unspecified type Yes

## 2017-03-25 NOTE — Addendum Note (Signed)
Addended by: Baldomero Lamy B on: 03/25/2017 12:18 PM   Modules accepted: Orders

## 2017-03-25 NOTE — Addendum Note (Signed)
Addended by: Carole Civil on: 03/25/2017 09:07 AM   Modules accepted: Orders

## 2017-03-29 ENCOUNTER — Telehealth: Payer: Self-pay | Admitting: Orthopedic Surgery

## 2017-03-29 NOTE — Telephone Encounter (Signed)
You can do a nurse check and you can ask [name] to look at it

## 2017-03-29 NOTE — Telephone Encounter (Signed)
Pt left message on voicemail that he has a concern with knee. He had TKA on 03/09/17 and he stated that it was warm and was just asking for a return call about this. [phone]

## 2017-03-29 NOTE — Telephone Encounter (Signed)
Patient states top of wound is warm, no redness or drainage, no other signs of infection

## 2017-03-30 ENCOUNTER — Ambulatory Visit (INDEPENDENT_AMBULATORY_CARE_PROVIDER_SITE_OTHER): Payer: Federal, State, Local not specified - PPO | Admitting: Orthopaedic Surgery

## 2017-03-30 ENCOUNTER — Ambulatory Visit (HOSPITAL_COMMUNITY): Payer: Federal, State, Local not specified - PPO | Attending: Orthopedic Surgery | Admitting: Physical Therapy

## 2017-03-30 ENCOUNTER — Encounter: Payer: Self-pay | Admitting: Orthopaedic Surgery

## 2017-03-30 ENCOUNTER — Encounter (HOSPITAL_COMMUNITY): Payer: Self-pay | Admitting: Physical Therapy

## 2017-03-30 VITALS — BP 123/72 | HR 102 | Temp 97.9°F | Ht 70.0 in | Wt 257.0 lb

## 2017-03-30 DIAGNOSIS — R600 Localized edema: Secondary | ICD-10-CM | POA: Insufficient documentation

## 2017-03-30 DIAGNOSIS — R262 Difficulty in walking, not elsewhere classified: Secondary | ICD-10-CM | POA: Diagnosis not present

## 2017-03-30 DIAGNOSIS — M6281 Muscle weakness (generalized): Secondary | ICD-10-CM

## 2017-03-30 DIAGNOSIS — M25662 Stiffness of left knee, not elsewhere classified: Secondary | ICD-10-CM | POA: Diagnosis not present

## 2017-03-30 DIAGNOSIS — Z4889 Encounter for other specified surgical aftercare: Secondary | ICD-10-CM

## 2017-03-30 NOTE — Patient Instructions (Signed)
   QUAD SET  Tighten your top thigh muscle as you attempt to press the back of your knee downward towards the table.  Hold for 3-5 seconds.   Repeat 15 times, at least 3 times per day.     KNEE FLEXION STRETCH - SELF ASSISTED  While seated in a chair, use your unaffected leg to bend your affected knee until a stretch is felt.  Hold for 3-5 seconds and relax.  Repeat 15 times at least 3 times per day.    QUAD SET - KNEE EXTENSION STRETCH - SEATED  While sitting, tighten your top thigh muscle to press the back of your knee downward towards the ground.   You should feel a gentle stretch in the back of your knee. Then, use your hands to push down on your leg, just above your knee joint as hard as you can tolerate to try to help the leg get straight.  Hold 3-5 seconds and repeat 10-15 times, 3 times per day.

## 2017-03-30 NOTE — Therapy (Signed)
[place], [place], [address] Phone: [phone]   Fax:  [phone]  Physical Therapy Evaluation  Patient Details  Name: [name] MRN: [id number] Date of Birth: [birth year] Referring Provider: Arther Abbott   Encounter Date: 03/30/2017      PT End of Session - 03/30/17 1745    Visit Number 1   Number of Visits 19   Date for PT Re-Evaluation 04/20/17   Authorization Type BCBS/Federal Employee PPO    Authorization Time Period 03/30/17 to 05/11/17   Authorization - Visit Number 1   Authorization - Number of Visits 10   PT Start Time 0815   PT Stop Time 0858   PT Time Calculation (min) 43 min   Activity Tolerance Patient tolerated treatment well   Behavior During Therapy [facility] for tasks assessed/performed      Past Medical History:  Diagnosis Date  . Arthritis   . CAD (coronary artery disease)    Multivessel disease status post CABG 08/2015  . Essential hypertension   . Hyperlipidemia   . Hypertension   . Sleep apnea    uses CPAP  . Type 2 diabetes mellitus ([place])     Past Surgical History:  Procedure Laterality Date  . APPENDECTOMY    . Biceps tendon surgery Right   . CARDIAC CATHETERIZATION N/A 08/25/2015   Procedure: Left Heart Cath and Coronary Angiography;  Surgeon: Belva Crome, MD;  Location: [place] [facility];  Service: Cardiovascular;  Laterality: N/A;  . CORONARY ARTERY BYPASS GRAFT N/A 08/29/2015   Procedure: CORONARY ARTERY BYPASS GRAFTING (CABG);  Surgeon: [name], MD;  Location: [place];  Service: Open Heart Surgery;  Laterality: N/A;  . KNEE ARTHROSCOPY Left   . TEE WITHOUT CARDIOVERSION N/A 08/29/2015   Procedure: TRANSESOPHAGEAL ECHOCARDIOGRAM (TEE);  Surgeon: [name], MD;  Location: [place];  Service: Open Heart Surgery;  Laterality: N/A;  . TOTAL KNEE ARTHROPLASTY Left 03/09/2017   Procedure: TOTAL KNEE ARTHROPLASTY;  Surgeon: Carole Civil, MD;   [facility];  Service: Orthopedics;  Laterality: Left;    There were no vitals filed for this visit.       Subjective Assessment - 03/30/17 0818    Subjective Patient reports that he has just worn his knees down over time and ulimtately ended up getting a L TKR due to pain on March 28th with Dr. Aline Brochure; things are going well since surgery, but standing in one spot is difficult, walking is OK, staris are also OK. He had HHPT and has since been discharged. No falls or close calls.    Pertinent History hx of NSTEMI, HTN, DM, CABG x4, hx of R knee scope    How long can you sit comfortably? 30 minutes    How long can you stand comfortably? 20 minutes    How long can you walk comfortably? has walked through [employer]    Patient Stated Goals as much ROM and flexibiity as he can get    Currently in Pain? Yes   Pain Score 2    Pain Location Knee   Pain Orientation Left   Pain Descriptors / Indicators Aching   Pain Type Surgical pain   Pain Radiating Towards none    Pain Onset 1 to 4 weeks ago   Pain Frequency Intermittent   Aggravating Factors  being in one position for too long   Pain Relieving Factors walking, movement    Effect of Pain  on Daily Activities moderate impact            [facility] PT Assessment - 03/30/17 0001      Assessment   Medical Diagnosis L TKR    Referring Provider Arther Abbott    Onset Date/Surgical Date 03/09/17   Next MD Visit Dr. Aline Brochure May 11th    Prior Therapy HHPT      Precautions   Precautions None     Balance Screen   Has the patient fallen in the past 6 months No   Has the patient had a decrease in activity level because of a fear of falling?  No   Is the patient reluctant to leave their home because of a fear of falling?  No     Prior Function   Level of Independence Independent;Independent with basic ADLs;Independent with gait;Independent with transfers   Vocation Full time employment   Vocation Requirements self-employed, physical  job involving squatting      Observation/Other Assessments   Observations localized edema noted surgical knee    Skin Integrity incision appears well healing without signs of infeciton or inflammation but is raised and does have one red spot on distal incision we will monitor      AROM   Left Knee Extension 8   Left Knee Flexion 89     Strength   Right Hip Flexion 5/5   Right Hip ABduction 4/5   Left Hip Flexion 4/5   Left Hip ABduction 3+/5   Right Knee Flexion 4/5   Right Knee Extension 4+/5   Left Knee Flexion 3+/5   Left Knee Extension 4+/5   Right Ankle Dorsiflexion 4+/5   Left Ankle Dorsiflexion 5/5     Ambulation/Gait   Gait Comments reduced gait speed, reduced ankle DF, reduced knee extension, reduced L knee flexion      6 minute walk test results    Aerobic Endurance Distance Walked 529   Endurance additional comments 3MWT, no devices      High Level Balance   High Level Balance Comments TUG 11 seconds no device                    [facility] Adult PT Treatment/Exercise - 03/30/17 0001      Exercises   Exercises Knee/Hip     Knee/Hip Exercises: Stretches   Active Hamstring Stretch Left;3 reps;30 seconds   Active Hamstring Stretch Limitations 12 inch box    Knee: Self-Stretch to increase Flexion Left   Knee: Self-Stretch Limitations 5 reps 5 second holds    Gastroc Stretch Both;3 reps;30 seconds                PT Education - 03/30/17 1744    Education provided Yes   Education Details prognosis, examination findings, HEP, POC    Person(s) Educated Patient   Methods Explanation;Demonstration;Handout   Comprehension Verbalized understanding;Returned demonstration;Need further instruction          PT Short Term Goals - 03/30/17 1749      PT SHORT TERM GOAL #1   Title Patient to demonstrate knee ROM as being 0-110 degrees in order to improve mechanics and reduce pain    Time 3   Period Weeks   Status New     PT SHORT TERM GOAL #2    Title Patient to be independent in performing scar mobility, patella mobilzation, and regular icing routine in order to improve self-efficacy in managing condition    Time 3   Period  Weeks   Status New     PT SHORT TERM GOAL #3   Title Patient to consistently be utilizing heel-toe gait pattern and able to ambulate with no device over uneven surfaces such as grass and gravel in order to improve general mobilty    Time 3   Period Weeks   Status New     PT SHORT TERM GOAL #4   Title Patient to be independent in correctly and consistently performing appropriate HEP, to be updated PRN    Time 1   Period Weeks   Status New           PT Long Term Goals - 03/30/17 1752      PT LONG TERM GOAL #1   Title Patient to demonstrate functional strength as being 5/5 in order to improve functional mechanics and general gait pattern    Time 6   Period Weeks   Status New     PT LONG TERM GOAL #2   Title Patient to be able to reciprocally ascend/descend 4 stairs with U railing, good eccentric control, minimal unsteadiness, in order to improve home and community access    Time 6   Period Weeks   Status New     PT LONG TERM GOAL #3   Title Patient to be able to ambulate 768ft during 3MWT in order to improve mobilty and overall community access    Time 6   Period Weeks   Status New     PT LONG TERM GOAL #4   Title Patient to be able to maintain SLS for at least 20 seconds each LE In order to show imrpoved balance    Time 6   Period Weeks   Status New               Plan - 03/30/17 1747    Clinical Impression Statement Patient arrives approximately 3 weeks after total knee replacement surgery by Dr. Aline Brochure; he reports that things are going well, he is mostly concerned with being able to squat again and getting his range and flexibility back. Examination reveals L knee stiffness, localized edema, functional weakness, gait deviation, and reduced tolerance to PLOF based activities.  Incision appears mostly well healing however will require ongoing monitoring to ensure non-complicated healing process. Recommend skilled PT services in order to address functional limitations and return to optimal level of function.    Rehab Potential Good   PT Frequency 3x / week   PT Duration 6 weeks   PT Treatment/Interventions ADLs/Self Care Home Management;Cryotherapy;DME Instruction;Gait training;Stair training;Functional mobility training;Therapeutic activities;Therapeutic exercise;Balance training;Neuromuscular re-education;Patient/family education;Manual techniques;Scar mobilization;Passive range of motion;Dry needling   PT Next Visit Plan review initial eval/goals, HEP; DO NOT USE ANKLE WEIGHTS OR CYBEX UNTIL ROM IS 0-110 DEGREES. Focus on edema control, ROM based activities, gait training, monitor incision's healing. Pre-gait and balance.    PT Home Exercise Plan Eval: quad sets, self overpressure for knee extension in HS stretch sitting position, knee flexion stretch, regular walking    Consulted and Agree with Plan of Care Patient      Patient will benefit from skilled therapeutic intervention in order to improve the following deficits and impairments:  Abnormal gait, Decreased skin integrity, Improper body mechanics, Decreased mobility, Decreased scar mobility, Decreased activity tolerance, Decreased range of motion, Decreased strength, Hypomobility, Difficulty walking, Increased edema, Impaired flexibility  Visit Diagnosis: Stiffness of left knee, not elsewhere classified - Plan: PT plan of care cert/re-cert  Localized edema - Plan: PT  plan of care cert/re-cert  Difficulty in walking, not elsewhere classified - Plan: PT plan of care cert/re-cert  Muscle weakness (generalized) - Plan: PT plan of care cert/re-cert      G-Codes - 44/62/86 1755    Functional Assessment Tool Used (Outpatient Only) Based on skilled clincial assessment of ROM, gait, strength, balance, incision     Functional Limitation Mobility: Walking and moving around   Mobility: Walking and Moving Around Current Status ([id number]) At least 20 percent but less than 40 percent impaired, limited or restricted   Mobility: Walking and Moving Around Goal Status [id number]) At least 1 percent but less than 20 percent impaired, limited or restricted       Problem List Patient Active Problem List   Diagnosis Date Noted  . Primary osteoarthritis of left knee 03/09/2017  . History of nocturia 03/02/2017  . OSA on CPAP 03/02/2016  . Hypoxia, sleep related 03/02/2016  . Cyst of mediastinum 01/15/2016  . S/P CABG x 4 08/29/2015  . Coronary artery disease involving native coronary artery with other forms of angina pectoris   . Morbid obesity ([place])   . Hyperglycemia   . Chronic systolic HF (heart failure) ([place])   . Pain in the chest 08/23/2015  . Non-ST elevation MI (NSTEMI) ([place]) 08/23/2015  . Diabetes mellitus type 2, uncontrolled ([place]) 08/23/2015  . Hypertension 08/23/2015  . Hyperlipidemia 08/23/2015  . Osteoarthritis, knee 05/24/2012  . Knee pain 10/29/2011  . Knee stiffness 10/29/2011  . S/P right knee arthroscopy 10/26/2011  . Medial meniscus, posterior horn derangement 09/07/2011  . Lateral meniscus derangement 09/07/2011  . OA (osteoarthritis) of knee 09/07/2011  . Rotator cuff syndrome of left shoulder 08/19/2011  . Right knee meniscal tear 08/19/2011    Deniece Ree PT, DPT [facility] [address] [place], [place], [address] Phone: [phone]   Fax:  [phone]  Name: [name] MRN: [id number] Date of Birth: 09/12/1961

## 2017-03-30 NOTE — Telephone Encounter (Signed)
Called patient; scheduled for today, 03/30/17 accordingly.

## 2017-03-30 NOTE — Telephone Encounter (Signed)
Please arrange this today

## 2017-03-30 NOTE — Progress Notes (Signed)
CC:  My knee is a little warm at times  He was concerned his left knee has been a little warm at times.  He thought he saw some redness early this morning.  He was told to come in.  His left total knee wound looks good.  There is no redness, no fluctuance, no discharge.  ROM is 0 to 110.  He walks well.  He has no pain.  NV is intact.  Encounter Diagnosis  Name Primary?  [name] Aftercare following surgery Yes   Keep appointment with Dr. Aline Brochure as scheduled.  Call if any problem whatsoever.  Electronically Signed Sanjuana Kava, MD 4/18/20182:57 PM

## 2017-04-01 ENCOUNTER — Encounter (HOSPITAL_COMMUNITY): Payer: Self-pay

## 2017-04-01 ENCOUNTER — Ambulatory Visit (HOSPITAL_COMMUNITY): Payer: Federal, State, Local not specified - PPO

## 2017-04-01 DIAGNOSIS — R600 Localized edema: Secondary | ICD-10-CM | POA: Diagnosis not present

## 2017-04-01 DIAGNOSIS — R262 Difficulty in walking, not elsewhere classified: Secondary | ICD-10-CM | POA: Diagnosis not present

## 2017-04-01 DIAGNOSIS — M25662 Stiffness of left knee, not elsewhere classified: Secondary | ICD-10-CM

## 2017-04-01 DIAGNOSIS — M6281 Muscle weakness (generalized): Secondary | ICD-10-CM

## 2017-04-01 NOTE — Therapy (Signed)
[facility] [address] [place], [place], [address] Phone: [phone]   Fax:  [phone]  Physical Therapy Treatment  Patient Details  Name: [name] MRN: [id number] Date of Birth: [birth year] Referring Provider: Arther Abbott   Encounter Date: 04/01/2017      PT End of Session - 04/01/17 0841    Visit Number 2   Number of Visits 19   Date for PT Re-Evaluation 04/20/17   Authorization Type BCBS/Federal Employee PPO    Authorization Time Period 03/30/17 to 05/11/17   Authorization - Visit Number 2   Authorization - Number of Visits 10   PT Start Time 0814   PT Stop Time 0859   PT Time Calculation (min) 45 min   Activity Tolerance Patient tolerated treatment well   Behavior During Therapy [facility] for tasks assessed/performed      Past Medical History:  Diagnosis Date  . Arthritis   . CAD (coronary artery disease)    Multivessel disease status post CABG 08/2015  . Essential hypertension   . Hyperlipidemia   . Hypertension   . Sleep apnea    uses CPAP  . Type 2 diabetes mellitus ([place])     Past Surgical History:  Procedure Laterality Date  . APPENDECTOMY    . Biceps tendon surgery Right   . CARDIAC CATHETERIZATION N/A 08/25/2015   Procedure: Left Heart Cath and Coronary Angiography;  Surgeon: Belva Crome, MD;  Location: [place];  Service: Cardiovascular;  Laterality: N/A;  . CORONARY ARTERY BYPASS GRAFT N/A 08/29/2015   Procedure: CORONARY ARTERY BYPASS GRAFTING (CABG);  Surgeon: [name], MD;  Location: [facility];  Service: Open Heart Surgery;  Laterality: N/A;  . KNEE ARTHROSCOPY Left   . TEE WITHOUT CARDIOVERSION N/A 08/29/2015   Procedure: TRANSESOPHAGEAL ECHOCARDIOGRAM (TEE);  Surgeon: [name], MD;  Location: [facility];  Service: Open Heart Surgery;  Laterality: N/A;  . TOTAL KNEE ARTHROPLASTY Left 03/09/2017   Procedure: TOTAL KNEE ARTHROPLASTY;  Surgeon: Carole Civil, MD;   Location: [facility];  Service: Orthopedics;  Laterality: Left;    There were no vitals filed for this visit.      Subjective Assessment - 04/01/17 0816    Subjective Pt states that he feels okay. He was finally able to get some sleep.   Pertinent History hx of NSTEMI, HTN, DM, CABG x4, hx of R knee scope    How long can you sit comfortably? 30 minutes    How long can you stand comfortably? 20 minutes    How long can you walk comfortably? has walked through [employer]    Patient Stated Goals as much ROM and flexibiity as he can get    Currently in Pain? Yes   Pain Score 2    Pain Location Knee   Pain Orientation Left   Pain Descriptors / Indicators Aching   Pain Type Surgical pain   Pain Onset 1 to 4 weeks ago   Pain Frequency Intermittent   Aggravating Factors  being in one position for too long   Pain Relieving Factors walking, movement   Effect of Pain on Daily Activities moderate impact            [facility] Adult PT Treatment/Exercise - 04/01/17 0001      Knee/Hip Exercises: Stretches   Active Hamstring Stretch Left;2 reps;30 seconds   Active Hamstring Stretch Limitations seated with OP (reviewed HEP stretch)   Knee: Self-Stretch to increase Flexion  Left   Knee: Self-Stretch Limitations 10 reps 5-10 sec hold each     Knee/Hip Exercises: Standing   Terminal Knee Extension Limitations GTB  10 x 5 sec holds each   Gait Training 78ft x 2 at EOS, pt with slight antalgia     Knee/Hip Exercises: Supine   Quad Sets Left;1 set;10 reps   Quad Sets Limitations max tactile cues   Heel Slides Left;1 set;15 reps   Heel Slides Limitations supine with sheet   Knee Extension Limitations 7   Knee Flexion Limitations 95     Manual Therapy   Manual Therapy Edema management   Manual therapy comments manual completed separate rest of treatment   Edema Management retrograde massage with LLE elevated on wedge to promote decreased edema and improved ROM (pt's AROM 7-90 following)              PT Education - 04/01/17 0842    Education provided Yes   Education Details proper quad contraction, continue HEP   Person(s) Educated Patient   Methods Explanation;Demonstration   Comprehension Verbalized understanding;Returned demonstration          PT Short Term Goals - 03/30/17 1749      PT SHORT TERM GOAL #1   Title Patient to demonstrate knee ROM as being 0-110 degrees in order to improve mechanics and reduce pain    Time 3   Period Weeks   Status New     PT SHORT TERM GOAL #2   Title Patient to be independent in performing scar mobility, patella mobilzation, and regular icing routine in order to improve self-efficacy in managing condition    Time 3   Period Weeks   Status New     PT SHORT TERM GOAL #3   Title Patient to consistently be utilizing heel-toe gait pattern and able to ambulate with no device over uneven surfaces such as grass and gravel in order to improve general mobilty    Time 3   Period Weeks   Status New     PT SHORT TERM GOAL #4   Title Patient to be independent in correctly and consistently performing appropriate HEP, to be updated PRN    Time 1   Period Weeks   Status New           PT Long Term Goals - 03/30/17 1752      PT LONG TERM GOAL #1   Title Patient to demonstrate functional strength as being 5/5 in order to improve functional mechanics and general gait pattern    Time 6   Period Weeks   Status New     PT LONG TERM GOAL #2   Title Patient to be able to reciprocally ascend/descend 4 stairs with U railing, good eccentric control, minimal unsteadiness, in order to improve home and community access    Time 6   Period Weeks   Status New     PT LONG TERM GOAL #3   Title Patient to be able to ambulate 720ft during 3MWT in order to improve mobilty and overall community access    Time 6   Period Weeks   Status New     PT LONG TERM GOAL #4   Title Patient to be able to maintain SLS for at least 20 seconds each LE  In order to show imrpoved balance    Time 6   Period Weeks   Status New  Plan - 04/01/17 1015    Clinical Impression Statement Reviewed pt's eval and goals and he did not have any follow-up questions. Pt's scar continues to heal; it did not have any increased redness or warmth to it. Began session with manual for edema control. Pt had some tenderness along distal medial HS during but no increased in pain following. Following manual, pt's AROM 7-90. Pt tolerated therex well but he demonstrated decreased ability to properly engage his quad and required max tactile cues to illicit a minor contraction. Pt's gait was slightly antalgic on the L after therex and he continues to demonstrate decreased knee extension and ankle DF throughout. Pt's AROM 7-95 at EOS and he did not have any increased pain. Continue POC.   Rehab Potential Good   PT Frequency 3x / week   PT Duration 6 weeks   PT Treatment/Interventions ADLs/Self Care Home Management;Cryotherapy;DME Instruction;Gait training;Stair training;Functional mobility training;Therapeutic activities;Therapeutic exercise;Balance training;Neuromuscular re-education;Patient/family education;Manual techniques;Scar mobilization;Passive range of motion;Dry needling   PT Next Visit Plan DO NOT USE ANKLE WEIGHTS OR CYBEX UNTIL ROM IS 0-110 DEGREES. Focus on edema control, ROM based activities, gait training, monitor incision's healing. Pre-gait and balance.    PT Home Exercise Plan Eval: quad sets, self overpressure for knee extension in HS stretch sitting position, knee flexion stretch, regular walking    Consulted and Agree with Plan of Care Patient      Patient will benefit from skilled therapeutic intervention in order to improve the following deficits and impairments:  Abnormal gait, Decreased skin integrity, Improper body mechanics, Decreased mobility, Decreased scar mobility, Decreased activity tolerance, Decreased range of motion,  Decreased strength, Hypomobility, Difficulty walking, Increased edema, Impaired flexibility  Visit Diagnosis: Stiffness of left knee, not elsewhere classified  Localized edema  Difficulty in walking, not elsewhere classified  Muscle weakness (generalized)     Problem List Patient Active Problem List   Diagnosis Date Noted  . Primary osteoarthritis of left knee 03/09/2017  . History of nocturia 03/02/2017  . OSA on CPAP 03/02/2016  . Hypoxia, sleep related 03/02/2016  . Cyst of mediastinum 01/15/2016  . S/P CABG x 4 08/29/2015  . Coronary artery disease involving native coronary artery with other forms of angina pectoris   . Morbid obesity ([place])   . Hyperglycemia   . Chronic systolic HF (heart failure) ([place])   . Pain in the chest 08/23/2015  . Non-ST elevation MI (NSTEMI) ([place]) 08/23/2015  . Diabetes mellitus type 2, uncontrolled ([place]) 08/23/2015  . Hypertension 08/23/2015  . Hyperlipidemia 08/23/2015  . Osteoarthritis, knee 05/24/2012  . Knee pain 10/29/2011  . Knee stiffness 10/29/2011  . S/P right knee arthroscopy 10/26/2011  . Medial meniscus, posterior horn derangement 09/07/2011  . Lateral meniscus derangement 09/07/2011  . OA (osteoarthritis) of knee 09/07/2011  . Rotator cuff syndrome of left shoulder 08/19/2011  . Right knee meniscal tear 08/19/2011     Geraldine Solar PT, DPT   [facility] [address] [place], [place], [address] Phone: [phone]   Fax:  [phone]  Name: [name] MRN: [id number] Date of Birth: 08-12-1961

## 2017-04-04 ENCOUNTER — Ambulatory Visit (HOSPITAL_COMMUNITY): Payer: Federal, State, Local not specified - PPO | Admitting: Physical Therapy

## 2017-04-04 DIAGNOSIS — M6281 Muscle weakness (generalized): Secondary | ICD-10-CM

## 2017-04-04 DIAGNOSIS — R262 Difficulty in walking, not elsewhere classified: Secondary | ICD-10-CM

## 2017-04-04 DIAGNOSIS — M25662 Stiffness of left knee, not elsewhere classified: Secondary | ICD-10-CM

## 2017-04-04 DIAGNOSIS — R600 Localized edema: Secondary | ICD-10-CM | POA: Diagnosis not present

## 2017-04-04 NOTE — Therapy (Signed)
[place], [place], [address] Phone: [phone]   Fax:  [phone]  Physical Therapy Treatment  Patient Details  Name: [name] MRN: [id number] Date of Birth: 1961-04-19 Referring Provider: Arther Abbott   Encounter Date: 04/04/2017      PT End of Session - 04/04/17 0953    Visit Number 3   Number of Visits 19   Date for PT Re-Evaluation 04/20/17   Authorization Type BCBS/Federal Employee PPO    Authorization Time Period 03/30/17 to 05/11/17   Authorization - Visit Number 3   Authorization - Number of Visits 10   PT Start Time 0935   PT Stop Time 1020   PT Time Calculation (min) 45 min   Activity Tolerance Patient tolerated treatment well   Behavior During Therapy [facility] for tasks assessed/performed      Past Medical History:  Diagnosis Date  . Arthritis   . CAD (coronary artery disease)    Multivessel disease status post CABG 08/2015  . Essential hypertension   . Hyperlipidemia   . Hypertension   . Sleep apnea    uses CPAP  . Type 2 diabetes mellitus ([place])     Past Surgical History:  Procedure Laterality Date  . APPENDECTOMY    . Biceps tendon surgery Right   . CARDIAC CATHETERIZATION N/A 08/25/2015   Procedure: Left Heart Cath and Coronary Angiography;  Surgeon: Belva Crome, MD;  Location: [place] [facility];  Service: Cardiovascular;  Laterality: N/A;  . CORONARY ARTERY BYPASS GRAFT N/A 08/29/2015   Procedure: CORONARY ARTERY BYPASS GRAFTING (CABG);  Surgeon: [name], MD;  Location: [facility];  Service: Open Heart Surgery;  Laterality: N/A;  . KNEE ARTHROSCOPY Left   . TEE WITHOUT CARDIOVERSION N/A 08/29/2015   Procedure: TRANSESOPHAGEAL ECHOCARDIOGRAM (TEE);  Surgeon: [name], MD;  Location: [place];  Service: Open Heart Surgery;  Laterality: N/A;  . TOTAL KNEE ARTHROPLASTY Left 03/09/2017   Procedure: TOTAL KNEE ARTHROPLASTY;  Surgeon: Carole Civil, MD;   Location: [facility];  Service: Orthopedics;  Laterality: Left;    There were no vitals filed for this visit.      Subjective Assessment - 04/04/17 0936    Subjective Pt reports some soreness today, likely due to the weather. He has no other complaints.    Pertinent History hx of NSTEMI, HTN, DM, CABG x4, hx of R knee scope    How long can you sit comfortably? 30 minutes    How long can you stand comfortably? 20 minutes    How long can you walk comfortably? has walked through [employer]    Patient Stated Goals as much ROM and flexibiity as he can get    Currently in Pain? Yes   Pain Score 2    Pain Location Knee   Pain Orientation Left   Pain Descriptors / Indicators Aching   Pain Type Surgical pain   Pain Onset 1 to 4 weeks ago   Pain Frequency Intermittent   Aggravating Factors  walking on it too long    Pain Relieving Factors exercsise have helped             [facility] PT Assessment - 04/04/17 0001      AROM   Left Knee Extension 5   Left Knee Flexion 100                     [facility] Adult PT Treatment/Exercise -  04/04/17 0001      Knee/Hip Exercises: Standing   Other Standing Knee Exercises ispilateral hip/UE flexion against wall x10 reps each; Side stepping over 16" hurdle with BUE support in // bars 2x5 reps Lt/Rt; Standing TKE x10 reps with green TB.      Knee/Hip Exercises: Seated   Other Seated Knee/Hip Exercises Lt knee flexion stretch 10x10 sec hold        Knee/Hip Exercises: Supine   Quad Sets Left;1 set;10 reps   Quad Sets Limitations x3 sec hold      Manual Therapy   Manual Therapy Soft tissue mobilization   Manual therapy comments manual completed separate rest of treatment   Soft tissue mobilization scar mobilization superior/inferior; Lt/Rt. Lt distal quadriceps STM                PT Education - 04/04/17 0945    Education provided Yes   Education Details importance of scar massage in decreasing adhesions   Person(s) Educated Patient    Methods Explanation   Comprehension Verbalized understanding          PT Short Term Goals - 03/30/17 1749      PT SHORT TERM GOAL #1   Title Patient to demonstrate knee ROM as being 0-110 degrees in order to improve mechanics and reduce pain    Time 3   Period Weeks   Status New     PT SHORT TERM GOAL #2   Title Patient to be independent in performing scar mobility, patella mobilzation, and regular icing routine in order to improve self-efficacy in managing condition    Time 3   Period Weeks   Status New     PT SHORT TERM GOAL #3   Title Patient to consistently be utilizing heel-toe gait pattern and able to ambulate with no device over uneven surfaces such as grass and gravel in order to improve general mobilty    Time 3   Period Weeks   Status New     PT SHORT TERM GOAL #4   Title Patient to be independent in correctly and consistently performing appropriate HEP, to be updated PRN    Time 1   Period Weeks   Status New           PT Long Term Goals - 03/30/17 1752      PT LONG TERM GOAL #1   Title Patient to demonstrate functional strength as being 5/5 in order to improve functional mechanics and general gait pattern    Time 6   Period Weeks   Status New     PT LONG TERM GOAL #2   Title Patient to be able to reciprocally ascend/descend 4 stairs with U railing, good eccentric control, minimal unsteadiness, in order to improve home and community access    Time 6   Period Weeks   Status New     PT LONG TERM GOAL #3   Title Patient to be able to ambulate 76ft during 3MWT in order to improve mobilty and overall community access    Time 6   Period Weeks   Status New     PT LONG TERM GOAL #4   Title Patient to be able to maintain SLS for at least 20 seconds each LE In order to show imrpoved balance    Time 6   Period Weeks   Status New               Plan - 04/04/17 1001  Clinical Impression Statement Today's session continued with treatment to  improve Lt knee ROM and strength. Pt was able to perform all exercises without increase in pain, reporting fatigue only. Manual techniques were performed to improve scar mobility and quadriceps relaxation. Performed several activities to improve hip/knee flexion on the Lt for carryover into ambulation however he will continue to need further reinforcement in this area. Ended session with ROM measurements of 0-5-100. Pt reporting no increase in pain after today's activities.    Rehab Potential Good   PT Frequency 3x / week   PT Duration 6 weeks   PT Treatment/Interventions ADLs/Self Care Home Management;Cryotherapy;DME Instruction;Gait training;Stair training;Functional mobility training;Therapeutic activities;Therapeutic exercise;Balance training;Neuromuscular re-education;Patient/family education;Manual techniques;Scar mobilization;Passive range of motion;Dry needling   PT Next Visit Plan DO NOT USE ANKLE WEIGHTS OR CYBEX UNTIL ROM IS 0-110 DEGREES.ROM based activities, gait training to improve knee flexion, scar mobilization    PT Home Exercise Plan Eval: quad sets, self overpressure for knee extension in HS stretch sitting position, knee flexion stretch, regular walking    Consulted and Agree with Plan of Care Patient      Patient will benefit from skilled therapeutic intervention in order to improve the following deficits and impairments:  Abnormal gait, Decreased skin integrity, Improper body mechanics, Decreased mobility, Decreased scar mobility, Decreased activity tolerance, Decreased range of motion, Decreased strength, Hypomobility, Difficulty walking, Increased edema, Impaired flexibility  Visit Diagnosis: Stiffness of left knee, not elsewhere classified  Localized edema  Difficulty in walking, not elsewhere classified  Muscle weakness (generalized)     Problem List Patient Active Problem List   Diagnosis Date Noted  . Primary osteoarthritis of left knee 03/09/2017  . History  of nocturia 03/02/2017  . OSA on CPAP 03/02/2016  . Hypoxia, sleep related 03/02/2016  . Cyst of mediastinum 01/15/2016  . S/P CABG x 4 08/29/2015  . Coronary artery disease involving native coronary artery with other forms of angina pectoris   . Morbid obesity ([place])   . Hyperglycemia   . Chronic systolic HF (heart failure) ([place])   . Pain in the chest 08/23/2015  . Non-ST elevation MI (NSTEMI) ([place]) 08/23/2015  . Diabetes mellitus type 2, uncontrolled ([place]) 08/23/2015  . Hypertension 08/23/2015  . Hyperlipidemia 08/23/2015  . Osteoarthritis, knee 05/24/2012  . Knee pain 10/29/2011  . Knee stiffness 10/29/2011  . S/P right knee arthroscopy 10/26/2011  . Medial meniscus, posterior horn derangement 09/07/2011  . Lateral meniscus derangement 09/07/2011  . OA (osteoarthritis) of knee 09/07/2011  . Rotator cuff syndrome of left shoulder 08/19/2011  . Right knee meniscal tear 08/19/2011   11:26 AM,04/04/17 Elly Modena PT, DPT [name] [address] [place], [place], [address] Phone: [phone]   Fax:  [phone]  Name: KA FLAMMER MRN: [id number] Date of Birth: 27-Nov-1961

## 2017-04-06 ENCOUNTER — Ambulatory Visit (HOSPITAL_COMMUNITY): Payer: Federal, State, Local not specified - PPO

## 2017-04-06 DIAGNOSIS — R600 Localized edema: Secondary | ICD-10-CM

## 2017-04-06 DIAGNOSIS — M25662 Stiffness of left knee, not elsewhere classified: Secondary | ICD-10-CM | POA: Diagnosis not present

## 2017-04-06 DIAGNOSIS — R262 Difficulty in walking, not elsewhere classified: Secondary | ICD-10-CM

## 2017-04-06 DIAGNOSIS — M6281 Muscle weakness (generalized): Secondary | ICD-10-CM

## 2017-04-06 NOTE — Therapy (Signed)
[place], [place], [address] Phone: [phone]   Fax:  [phone]  Physical Therapy Treatment  Patient Details  Name: [name] MRN: [id number] Date of Birth: 08-15-1961 Referring Provider: Arther Abbott   Encounter Date: 04/06/2017      PT End of Session - 04/06/17 0907    Visit Number 4   Number of Visits 19   Date for PT Re-Evaluation 04/20/17   Authorization Type BCBS/Federal Employee PPO    Authorization Time Period 03/30/17 to 05/11/17   PT Start Time 0902   PT Stop Time 0947   PT Time Calculation (min) 45 min   Activity Tolerance Patient tolerated treatment well;No increased pain   Behavior During Therapy [facility] for tasks assessed/performed      Past Medical History:  Diagnosis Date  . Arthritis   . CAD (coronary artery disease)    Multivessel disease status post CABG 08/2015  . Essential hypertension   . Hyperlipidemia   . Hypertension   . Sleep apnea    uses CPAP  . Type 2 diabetes mellitus ([place])     Past Surgical History:  Procedure Laterality Date  . APPENDECTOMY    . Biceps tendon surgery Right   . CARDIAC CATHETERIZATION N/A 08/25/2015   Procedure: Left Heart Cath and Coronary Angiography;  Surgeon: Belva Crome, MD;  Location: [place] [facility];  Service: Cardiovascular;  Laterality: N/A;  . CORONARY ARTERY BYPASS GRAFT N/A 08/29/2015   Procedure: CORONARY ARTERY BYPASS GRAFTING (CABG);  Surgeon: [name], MD;  Location: [facility];  Service: Open Heart Surgery;  Laterality: N/A;  . KNEE ARTHROSCOPY Left   . TEE WITHOUT CARDIOVERSION N/A 08/29/2015   Procedure: TRANSESOPHAGEAL ECHOCARDIOGRAM (TEE);  Surgeon: [name], MD;  Location: [facility];  Service: Open Heart Surgery;  Laterality: N/A;  . TOTAL KNEE ARTHROPLASTY Left 03/09/2017   Procedure: TOTAL KNEE ARTHROPLASTY;  Surgeon: Carole Civil, MD;  Location: [facility];  Service: Orthopedics;  Laterality:  Left;    There were no vitals filed for this visit.      Subjective Assessment - 04/06/17 0904    Subjective Pt reports knee has some soreness today, pain scale 3/10.     Pertinent History hx of NSTEMI, HTN, DM, CABG x4, hx of R knee scope    Patient Stated Goals as much ROM and flexibiity as he can get    Currently in Pain? Yes   Pain Score 3    Pain Location Knee   Pain Orientation Left   Pain Descriptors / Indicators Aching;Sore   Pain Type Surgical pain   Pain Radiating Towards none   Pain Onset 1 to 4 weeks ago   Pain Frequency Intermittent   Aggravating Factors  walking on it too long   Pain Relieving Factors exercise have helped   Effect of Pain on Daily Activities moderate impact                         [facility] Adult PT Treatment/Exercise - 04/06/17 0001      Knee/Hip Exercises: Stretches   Sports administrator 3 reps;30 seconds   Quad Stretch Limitations prone wiht sheet   Knee: Self-Stretch to increase Flexion Left   Knee: Self-Stretch Limitations 10 reps 5-10 sec hold each 12in step   Gastroc Stretch Both;3 reps;30 seconds   Gastroc Stretch Limitations slant board     Knee/Hip Exercises: Standing  Terminal Knee Extension Limitations GTB  10 x 5 sec holds each   Gait Training 282ft with proper heel to toe mechanics and equal stance phase   Other Standing Knee Exercises ipsilateral hip/UE flexion against wall x10 reps each; side stepping over 16" hurdle 2x5 reps Lt and Rt in // bars.      Knee/Hip Exercises: Supine   Short Arc Quad Sets 15 reps   Heel Slides Left;1 set;15 reps   Heel Slides Limitations AAROM with Rt LE   Knee Extension Limitations 4   Knee Flexion Limitations 106     Manual Therapy   Manual Therapy Soft tissue mobilization   Manual therapy comments manual completed separate rest of treatment   Edema Management retrograde massage with LLE elevated on wedge to promote decreased edema and improved ROM   Soft tissue mobilization scar  mobilization superior/inferior; Lt/Rt. Lt distal quadriceps STM                  PT Short Term Goals - 03/30/17 1749      PT SHORT TERM GOAL #1   Title Patient to demonstrate knee ROM as being 0-110 degrees in order to improve mechanics and reduce pain    Time 3   Period Weeks   Status New     PT SHORT TERM GOAL #2   Title Patient to be independent in performing scar mobility, patella mobilzation, and regular icing routine in order to improve self-efficacy in managing condition    Time 3   Period Weeks   Status New     PT SHORT TERM GOAL #3   Title Patient to consistently be utilizing heel-toe gait pattern and able to ambulate with no device over uneven surfaces such as grass and gravel in order to improve general mobilty    Time 3   Period Weeks   Status New     PT SHORT TERM GOAL #4   Title Patient to be independent in correctly and consistently performing appropriate HEP, to be updated PRN    Time 1   Period Weeks   Status New           PT Long Term Goals - 03/30/17 1752      PT LONG TERM GOAL #1   Title Patient to demonstrate functional strength as being 5/5 in order to improve functional mechanics and general gait pattern    Time 6   Period Weeks   Status New     PT LONG TERM GOAL #2   Title Patient to be able to reciprocally ascend/descend 4 stairs with U railing, good eccentric control, minimal unsteadiness, in order to improve home and community access    Time 6   Period Weeks   Status New     PT LONG TERM GOAL #3   Title Patient to be able to ambulate 772ft during 3MWT in order to improve mobilty and overall community access    Time 6   Period Weeks   Status New     PT LONG TERM GOAL #4   Title Patient to be able to maintain SLS for at least 20 seconds each LE In order to show imrpoved balance    Time 6   Period Weeks   Status New               Plan - 04/06/17 1255    Clinical Impression Statement Continued session focus on  knee mobility and strengthening to improve gait mechanics.  Manual  technqiues were complete to improve scar tissue mobilty, reduce tightness in quadriceps and edema control with LE elevated.  Pt making progress towards ROM goals with AROM at 4-106 degrees following manual and therex.  Continued standing exercises with focus on improivng hip and knee flexion to progress to gait mechanics.  Pt able to demonstrate appropraite gait mechanics without AD today.  No reports of increased pain through session.     Rehab Potential Good   PT Frequency 3x / week   PT Duration 6 weeks   PT Treatment/Interventions ADLs/Self Care Home Management;Cryotherapy;DME Instruction;Gait training;Stair training;Functional mobility training;Therapeutic activities;Therapeutic exercise;Balance training;Neuromuscular re-education;Patient/family education;Manual techniques;Scar mobilization;Passive range of motion;Dry needling   PT Next Visit Plan DO NOT USE ANKLE WEIGHTS OR CYBEX UNTIL ROM IS 0-110 DEGREES.ROM based activities, gait training to improve knee flexion, scar mobilization    PT Home Exercise Plan Eval: quad sets, self overpressure for knee extension in HS stretch sitting position, knee flexion stretch, regular walking       Patient will benefit from skilled therapeutic intervention in order to improve the following deficits and impairments:  Abnormal gait, Decreased skin integrity, Improper body mechanics, Decreased mobility, Decreased scar mobility, Decreased activity tolerance, Decreased range of motion, Decreased strength, Hypomobility, Difficulty walking, Increased edema, Impaired flexibility  Visit Diagnosis: Stiffness of left knee, not elsewhere classified  Localized edema  Difficulty in walking, not elsewhere classified  Muscle weakness (generalized)     Problem List Patient Active Problem List   Diagnosis Date Noted  . Primary osteoarthritis of left knee 03/09/2017  . History of nocturia  03/02/2017  . OSA on CPAP 03/02/2016  . Hypoxia, sleep related 03/02/2016  . Cyst of mediastinum 01/15/2016  . S/P CABG x 4 08/29/2015  . Coronary artery disease involving native coronary artery with other forms of angina pectoris   . Morbid obesity ([place])   . Hyperglycemia   . Chronic systolic HF (heart failure) ([place])   . Pain in the chest 08/23/2015  . Non-ST elevation MI (NSTEMI) ([place]) 08/23/2015  . Diabetes mellitus type 2, uncontrolled ([place]) 08/23/2015  . Hypertension 08/23/2015  . Hyperlipidemia 08/23/2015  . Osteoarthritis, knee 05/24/2012  . Knee pain 10/29/2011  . Knee stiffness 10/29/2011  . S/P right knee arthroscopy 10/26/2011  . Medial meniscus, posterior horn derangement 09/07/2011  . Lateral meniscus derangement 09/07/2011  . OA (osteoarthritis) of knee 09/07/2011  . Rotator cuff syndrome of left shoulder 08/19/2011  . Right knee meniscal tear 08/19/2011   Ihor Austin, LPTA; CBIS [phone]  Aldona Lento 04/06/2017, 1:01 PM  [facility] [address] [place], [place], [address] Phone: [phone]   Fax:  [phone]  Name: [name] MRN: [id number] Date of Birth: 01-Jun-1961

## 2017-04-08 ENCOUNTER — Ambulatory Visit (HOSPITAL_COMMUNITY): Payer: Federal, State, Local not specified - PPO

## 2017-04-08 DIAGNOSIS — M6281 Muscle weakness (generalized): Secondary | ICD-10-CM

## 2017-04-08 DIAGNOSIS — R262 Difficulty in walking, not elsewhere classified: Secondary | ICD-10-CM | POA: Diagnosis not present

## 2017-04-08 DIAGNOSIS — R600 Localized edema: Secondary | ICD-10-CM

## 2017-04-08 DIAGNOSIS — M25662 Stiffness of left knee, not elsewhere classified: Secondary | ICD-10-CM

## 2017-04-08 NOTE — Therapy (Signed)
[place], [place], [address] Phone: [phone]   Fax:  [phone]  Physical Therapy Treatment  Patient Details  Name: [name] MRN: [id number] Date of Birth: 1961/02/02 Referring Provider: Arther Abbott   Encounter Date: 04/08/2017      PT End of Session - 04/08/17 0858    Visit Number 5   Number of Visits 19   Date for PT Re-Evaluation 04/20/17   Authorization Type BCBS/Federal Employee PPO    Authorization Time Period 03/30/17 to 05/11/17   Authorization - Visit Number 4   Authorization - Number of Visits 10   PT Start Time 3500   PT Stop Time 0944   PT Time Calculation (min) 52 min   Activity Tolerance Patient tolerated treatment well;No increased pain   Behavior During Therapy WFL for tasks assessed/performed      Past Medical History:  Diagnosis Date  . Arthritis   . CAD (coronary artery disease)    Multivessel disease status post CABG 08/2015  . Essential hypertension   . Hyperlipidemia   . Hypertension   . Sleep apnea    uses CPAP  . Type 2 diabetes mellitus ([place])     Past Surgical History:  Procedure Laterality Date  . APPENDECTOMY    . Biceps tendon surgery Right   . CARDIAC CATHETERIZATION N/A 08/25/2015   Procedure: Left Heart Cath and Coronary Angiography;  Surgeon: Belva Crome, MD;  Location: [facility];  Service: Cardiovascular;  Laterality: N/A;  . CORONARY ARTERY BYPASS GRAFT N/A 08/29/2015   Procedure: CORONARY ARTERY BYPASS GRAFTING (CABG);  Surgeon: [name], MD;  Location: [facility];  Service: Open Heart Surgery;  Laterality: N/A;  . KNEE ARTHROSCOPY Left   . TEE WITHOUT CARDIOVERSION N/A 08/29/2015   Procedure: TRANSESOPHAGEAL ECHOCARDIOGRAM (TEE);  Surgeon: [name], MD;  Location: [facility];  Service: Open Heart Surgery;  Laterality: N/A;  . TOTAL KNEE ARTHROPLASTY Left 03/09/2017   Procedure: TOTAL KNEE ARTHROPLASTY;  Surgeon: Carole Civil, MD;  Location: [facility];  Service: Orthopedics;  Laterality: Left;    There were no vitals filed for this visit.      Subjective Assessment - 04/08/17 0851    Subjective Pt reports increased standing with increased edema present yesterday with increased pain at entrance today, current pain scale 6/10 dull achey stiffness on anterior knee    Pertinent History hx of NSTEMI, HTN, DM, CABG x4, hx of R knee scope    Patient Stated Goals as much ROM and flexibiity as he can get    Currently in Pain? Yes   Pain Score 6    Pain Location Knee   Pain Orientation Left;Anterior   Pain Descriptors / Indicators Aching;Sore;Dull;Tightness   Pain Type Surgical pain   Pain Onset 1 to 4 weeks ago   Pain Frequency Intermittent   Aggravating Factors  walking on it too long    Pain Relieving Factors exercise have helped    Effect of Pain on Daily Activities moderate impact                         [facility] Adult PT Treatment/Exercise - 04/08/17 0001      Ambulation/Gait   Ambulation Distance (Feet) 226 Feet   Assistive device None   Gait Pattern Step-through pattern   Gait Comments cueing for heel to toe sequence; equal stance phase and appropriate arm swing; reduced  gait speed, reduced ankle DF, reduced knee extension, reduced L knee flexion      Knee/Hip Exercises: Stretches   Active Hamstring Stretch Left;2 reps;30 seconds     Knee/Hip Exercises: Aerobic   Stationary Bike 3' initially rocking then full revolution seat 17 (no charge)     Knee/Hip Exercises: Standing   Heel Raises 15 reps   Heel Raises Limitations incline slope   Terminal Knee Extension Limitations Resume next session   Rocker Board 2 minutes   Rocker Board Limitations R/L    Gait Training 226ft with proper heel to toe mechanics and equal stance phase   Other Standing Knee Exercises sidestep R/L over 16in hurdles 2RT      Knee/Hip Exercises: Supine   Quad Sets Left;1 set;10 reps   Quad Sets  Limitations 3" holds; tactile and verbal cueing to imprpve distal quad contraction   Short Arc [employer] 15 reps   Short Arc Quad Sets Limitations 3" holds   Heel Slides Left;1 set;15 reps   Heel Slides Limitations AAROM with Rt LE   Knee Extension Limitations 4   Knee Flexion Limitations 110  following manual and therex     Manual Therapy   Manual Therapy Soft tissue mobilization;Edema management;Joint mobilization;Passive ROM   Manual therapy comments manual completed separate rest of treatment   Edema Management retrograde massage with LLE elevated on wedge to promote decreased edema and improved ROM   Joint Mobilization patella mobility all directions   Soft tissue mobilization scar mobilization superior/inferior; Lt/Rt. Lt distal quadriceps STM   Passive ROM PROM flexion and extension                  PT Short Term Goals - 03/30/17 1749      PT SHORT TERM GOAL #1   Title Patient to demonstrate knee ROM as being 0-110 degrees in order to improve mechanics and reduce pain    Time 3   Period Weeks   Status New     PT SHORT TERM GOAL #2   Title Patient to be independent in performing scar mobility, patella mobilzation, and regular icing routine in order to improve self-efficacy in managing condition    Time 3   Period Weeks   Status New     PT SHORT TERM GOAL #3   Title Patient to consistently be utilizing heel-toe gait pattern and able to ambulate with no device over uneven surfaces such as grass and gravel in order to improve general mobilty    Time 3   Period Weeks   Status New     PT SHORT TERM GOAL #4   Title Patient to be independent in correctly and consistently performing appropriate HEP, to be updated PRN    Time 1   Period Weeks   Status New           PT Long Term Goals - 03/30/17 1752      PT LONG TERM GOAL #1   Title Patient to demonstrate functional strength as being 5/5 in order to improve functional mechanics and general gait pattern     Time 6   Period Weeks   Status New     PT LONG TERM GOAL #2   Title Patient to be able to reciprocally ascend/descend 4 stairs with U railing, good eccentric control, minimal unsteadiness, in order to improve home and community access    Time 6   Period Weeks   Status New     PT LONG  TERM GOAL #3   Title Patient to be able to ambulate 779ft during 3MWT in order to improve mobilty and overall community access    Time 6   Period Weeks   Status New     PT LONG TERM GOAL #4   Title Patient to be able to maintain SLS for at least 20 seconds each LE In order to show imrpoved balance    Time 6   Period Weeks   Status New               Plan - 04/08/17 0944    Clinical Impression Statement Pt progressing well towards goals with reports of compliance with HEP and has began walking program.  Session focus on knee mobility and improve gait mechanics.  Began session with manual technqiues to improve scar tissue mobility, reduce tightness in quadriceps musculature and edema control.  AROM improved 4-110 following manual and therex.  Added rockerboard to improve weight distribution wiht gait and continued with sidestepping over hurdles to include glut med activation for strengthening and balance to improve stability with gait.  EOS pt reports decreased pain to 3/10 with improved gait mechanics.     Rehab Potential Good   PT Frequency 3x / week   PT Duration 6 weeks   PT Treatment/Interventions ADLs/Self Care Home Management;Cryotherapy;DME Instruction;Gait training;Stair training;Functional mobility training;Therapeutic activities;Therapeutic exercise;Balance training;Neuromuscular re-education;Patient/family education;Manual techniques;Scar mobilization;Passive range of motion;Dry needling   PT Next Visit Plan Next session focus on quad strengthening to improve extension, quad sets, SAQ, standing TKE, etc.... DO NOT USE ANKLE WEIGHTS OR CYBEX UNTIL ROM IS 0-110 DEGREES.ROM based activities,  gait training to improve knee flexion, scar mobilization    PT Home Exercise Plan Eval: quad sets, self overpressure for knee extension in HS stretch sitting position, knee flexion stretch, regular walking       Patient will benefit from skilled therapeutic intervention in order to improve the following deficits and impairments:  Abnormal gait, Decreased skin integrity, Improper body mechanics, Decreased mobility, Decreased scar mobility, Decreased activity tolerance, Decreased range of motion, Decreased strength, Hypomobility, Difficulty walking, Increased edema, Impaired flexibility  Visit Diagnosis: Stiffness of left knee, not elsewhere classified  Localized edema  Difficulty in walking, not elsewhere classified  Muscle weakness (generalized)     Problem List Patient Active Problem List   Diagnosis Date Noted  . Primary osteoarthritis of left knee 03/09/2017  . History of nocturia 03/02/2017  . OSA on CPAP 03/02/2016  . Hypoxia, sleep related 03/02/2016  . Cyst of mediastinum 01/15/2016  . S/P CABG x 4 08/29/2015  . Coronary artery disease involving native coronary artery with other forms of angina pectoris   . Morbid obesity ([place])   . Hyperglycemia   . Chronic systolic HF (heart failure) ([place])   . Pain in the chest 08/23/2015  . Non-ST elevation MI (NSTEMI) ([place]) 08/23/2015  . Diabetes mellitus type 2, uncontrolled ([place]) 08/23/2015  . Hypertension 08/23/2015  . Hyperlipidemia 08/23/2015  . Osteoarthritis, knee 05/24/2012  . Knee pain 10/29/2011  . Knee stiffness 10/29/2011  . S/P right knee arthroscopy 10/26/2011  . Medial meniscus, posterior horn derangement 09/07/2011  . Lateral meniscus derangement 09/07/2011  . OA (osteoarthritis) of knee 09/07/2011  . Rotator cuff syndrome of left shoulder 08/19/2011  . Right knee meniscal tear 08/19/2011   [name], LPTA; CBIS [phone]  [name] 04/08/2017, 10:06 AM  [place], [place], [address] Phone:  [phone]   Fax:  [phone]  Name: [name] MRN: [id number] Date of Birth: 1961/01/27

## 2017-04-11 ENCOUNTER — Ambulatory Visit (HOSPITAL_COMMUNITY): Payer: Federal, State, Local not specified - PPO | Admitting: Physical Therapy

## 2017-04-11 DIAGNOSIS — M6281 Muscle weakness (generalized): Secondary | ICD-10-CM | POA: Diagnosis not present

## 2017-04-11 DIAGNOSIS — R262 Difficulty in walking, not elsewhere classified: Secondary | ICD-10-CM

## 2017-04-11 DIAGNOSIS — M25662 Stiffness of left knee, not elsewhere classified: Secondary | ICD-10-CM | POA: Diagnosis not present

## 2017-04-11 DIAGNOSIS — R600 Localized edema: Secondary | ICD-10-CM

## 2017-04-11 NOTE — Therapy (Signed)
[place], [place], [address] Phone: [phone]   Fax:  [phone]  Physical Therapy Treatment  Patient Details  Name: [name] MRN: [id number] Date of Birth: 1961-11-06 Referring Provider: Arther Abbott   Encounter Date: 04/11/2017      PT End of Session - 04/11/17 0956    Visit Number 6   Number of Visits 19   Date for PT Re-Evaluation 04/20/17   Authorization Type BCBS/Federal Employee PPO    Authorization Time Period 03/30/17 to 05/11/17   Authorization - Visit Number 6   Authorization - Number of Visits 10   PT Start Time 2595   PT Stop Time 0952   PT Time Calculation (min) 47 min   Activity Tolerance Patient tolerated treatment well;No increased pain   Behavior During Therapy WFL for tasks assessed/performed      Past Medical History:  Diagnosis Date  . Arthritis   . CAD (coronary artery disease)    Multivessel disease status post CABG 08/2015  . Essential hypertension   . Hyperlipidemia   . Hypertension   . Sleep apnea    uses CPAP  . Type 2 diabetes mellitus ([place])     Past Surgical History:  Procedure Laterality Date  . APPENDECTOMY    . Biceps tendon surgery Right   . CARDIAC CATHETERIZATION N/A 08/25/2015   Procedure: Left Heart Cath and Coronary Angiography;  Surgeon: Belva Crome, MD;  Location: [facility];  Service: Cardiovascular;  Laterality: N/A;  . CORONARY ARTERY BYPASS GRAFT N/A 08/29/2015   Procedure: CORONARY ARTERY BYPASS GRAFTING (CABG);  Surgeon: [name], MD;  Location: [facility];  Service: Open Heart Surgery;  Laterality: N/A;  . KNEE ARTHROSCOPY Left   . TEE WITHOUT CARDIOVERSION N/A 08/29/2015   Procedure: TRANSESOPHAGEAL ECHOCARDIOGRAM (TEE);  Surgeon: [name], MD;  Location: [facility];  Service: Open Heart Surgery;  Laterality: N/A;  . TOTAL KNEE ARTHROPLASTY Left 03/09/2017   Procedure: TOTAL KNEE ARTHROPLASTY;  Surgeon: Carole Civil, MD;  Location: [facility];  Service: Orthopedics;  Laterality: Left;    There were no vitals filed for this visit.      Subjective Assessment - 04/11/17 0915    Subjective Pt states no pain, just tightness in Lt knee this morning.  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  states between his wife and his sister, he is doing all his exercises.                           [facility] Adult PT Treatment/Exercise - 04/11/17 0001      Ambulation/Gait   Ambulation Distance (Feet) 226 Feet   Assistive device None   Gait Pattern Step-through pattern     Knee/Hip Exercises: Stretches   Knee: Self-Stretch to increase Flexion Left   Knee: Self-Stretch Limitations 10 reps 5-10 sec hold each 12in step   Gastroc Stretch Both;3 reps;30 seconds   Gastroc Stretch Limitations slant board     Knee/Hip Exercises: Aerobic   Stationary Bike 3' initially rocking then full revolution 2 minutes seat 17 (no charge)     Knee/Hip Exercises: Standing   Heel Raises 15 reps   Lateral Step Up Left;10 reps;Step Height: 4";Hand Hold: 1   Forward Step Up Left;10 reps;Step Height: 4";Hand Hold: 1     Knee/Hip Exercises: Supine   Quad Sets Left;1 set;15 reps   [employer]  Sets Limitations 3" holds   Short Arc [employer] 15 reps   Short Arc Quad Sets Limitations 3" holds   Heel Slides Left;1 set;15 reps   Heel Slides Limitations AAROM with Rt LE   Knee Extension Limitations 4   Knee Flexion Limitations 110     Manual Therapy   Manual Therapy Soft tissue mobilization;Joint mobilization;Passive ROM;Myofascial release   Manual therapy comments manual completed separate rest of treatment   Edema Management --   Joint Mobilization patella mobility all directions   Soft tissue mobilization scar mobilization superior/inferior; Lt/Rt. Lt distal quadriceps STM   Myofascial Release along scar line (restrictions throughout) and medial/lateral knee   Passive ROM PROM flexion and extension                   PT Short Term Goals - 03/30/17 1749      PT SHORT TERM GOAL #1   Title Patient to demonstrate knee ROM as being 0-110 degrees in order to improve mechanics and reduce pain    Time 3   Period Weeks   Status New     PT SHORT TERM GOAL #2   Title Patient to be independent in performing scar mobility, patella mobilzation, and regular icing routine in order to improve self-efficacy in managing condition    Time 3   Period Weeks   Status New     PT SHORT TERM GOAL #3   Title Patient to consistently be utilizing heel-toe gait pattern and able to ambulate with no device over uneven surfaces such as grass and gravel in order to improve general mobilty    Time 3   Period Weeks   Status New     PT SHORT TERM GOAL #4   Title Patient to be independent in correctly and consistently performing appropriate HEP, to be updated PRN    Time 1   Period Weeks   Status New           PT Long Term Goals - 03/30/17 1752      PT LONG TERM GOAL #1   Title Patient to demonstrate functional strength as being 5/5 in order to improve functional mechanics and general gait pattern    Time 6   Period Weeks   Status New     PT LONG TERM GOAL #2   Title Patient to be able to reciprocally ascend/descend 4 stairs with U railing, good eccentric control, minimal unsteadiness, in order to improve home and community access    Time 6   Period Weeks   Status New     PT LONG TERM GOAL #3   Title Patient to be able to ambulate 760ft during 3MWT in order to improve mobilty and overall community access    Time 6   Period Weeks   Status New     PT LONG TERM GOAL #4   Title Patient to be able to maintain SLS for at least 20 seconds each LE In order to show imrpoved balance    Time 6   Period Weeks   Status New               Plan - 04/11/17 0957    Clinical Impression Statement Continued with exercise progression, adding step ups to POC.  Pt able to complete these in good  form and without pain.  Main focus continued on achieving end range of motion.  Completed myofascial techniques as patient with retrictions along entire scar line and medial/lateral  knee.  Most release felt in inferior portion of scar this session.  ROM holds at 4-110.  Pt encouraged to work on adhesions at home.  Pt with good patellar mobility without need of mobes.     Rehab Potential Good   PT Frequency 3x / week   PT Duration 6 weeks   PT Treatment/Interventions ADLs/Self Care Home Management;Cryotherapy;DME Instruction;Gait training;Stair training;Functional mobility training;Therapeutic activities;Therapeutic exercise;Balance training;Neuromuscular re-education;Patient/family education;Manual techniques;Scar mobilization;Passive range of motion;Dry needling   PT Next Visit Plan DO NOT USE ANKLE WEIGHTS OR CYBEX UNTIL ROM IS 0-110 DEGREES.  ROM based activities, gait training to improve knee flexion, scar mobilization.  Next session begin forward lunge on 4" step without UE to work on stability and quad strengthening.     PT Home Exercise Plan Eval: quad sets, self overpressure for knee extension in HS stretch sitting position, knee flexion stretch, regular walking       Patient will benefit from skilled therapeutic intervention in order to improve the following deficits and impairments:  Abnormal gait, Decreased skin integrity, Improper body mechanics, Decreased mobility, Decreased scar mobility, Decreased activity tolerance, Decreased range of motion, Decreased strength, Hypomobility, Difficulty walking, Increased edema, Impaired flexibility  Visit Diagnosis: Stiffness of left knee, not elsewhere classified  Localized edema  Difficulty in walking, not elsewhere classified  Muscle weakness (generalized)     Problem List Patient Active Problem List   Diagnosis Date Noted  . Primary osteoarthritis of left knee 03/09/2017  . History of nocturia 03/02/2017  . OSA on CPAP 03/02/2016  .  Hypoxia, sleep related 03/02/2016  . Cyst of mediastinum 01/15/2016  . S/P CABG x 4 08/29/2015  . Coronary artery disease involving native coronary artery with other forms of angina pectoris   . Morbid obesity ([place])   . Hyperglycemia   . Chronic systolic HF (heart failure) ([place])   . Pain in the chest 08/23/2015  . Non-ST elevation MI (NSTEMI) ([place]) 08/23/2015  . Diabetes mellitus type 2, uncontrolled ([place]) 08/23/2015  . Hypertension 08/23/2015  . Hyperlipidemia 08/23/2015  . Osteoarthritis, knee 05/24/2012  . Knee pain 10/29/2011  . Knee stiffness 10/29/2011  . S/P right knee arthroscopy 10/26/2011  . Medial meniscus, posterior horn derangement 09/07/2011  . Lateral meniscus derangement 09/07/2011  . OA (osteoarthritis) of knee 09/07/2011  . Rotator cuff syndrome of left shoulder 08/19/2011  . Right knee meniscal tear 08/19/2011    Teena Irani, PTA/CLT [phone]  04/11/2017, 10:01 AM  [place], [place], [address] Phone: [phone]   Fax:  [phone]  Name: [name] MRN: [id number] Date of Birth: Dec 19, 1960

## 2017-04-13 ENCOUNTER — Ambulatory Visit (HOSPITAL_COMMUNITY): Payer: Federal, State, Local not specified - PPO | Attending: Orthopedic Surgery

## 2017-04-13 DIAGNOSIS — M25662 Stiffness of left knee, not elsewhere classified: Secondary | ICD-10-CM

## 2017-04-13 DIAGNOSIS — R262 Difficulty in walking, not elsewhere classified: Secondary | ICD-10-CM | POA: Diagnosis not present

## 2017-04-13 DIAGNOSIS — M6281 Muscle weakness (generalized): Secondary | ICD-10-CM | POA: Diagnosis not present

## 2017-04-13 DIAGNOSIS — R600 Localized edema: Secondary | ICD-10-CM

## 2017-04-13 NOTE — Therapy (Signed)
[facility] [place], [place], [address] Phone: [phone]   Fax:  [phone]  Physical Therapy Treatment  Patient Details  Name: [name] MRN: [id number] Date of Birth: 09/23/1961 Referring Provider: Arther Abbott  Encounter Date: 04/13/2017      PT End of Session - 04/13/17 0936    Visit Number 7   Number of Visits 19   Date for PT Re-Evaluation 04/20/17   Authorization Type BCBS/Federal Employee PPO    Authorization Time Period 03/30/17 to 05/11/17   Authorization - Visit Number 7   Authorization - Number of Visits 10   PT Start Time 0093   PT Stop Time 0946  5' initially on bike, no charge   PT Time Calculation (min) 48 min   Activity Tolerance Patient tolerated treatment well;No increased pain   Behavior During Therapy WFL for tasks assessed/performed      Past Medical History:  Diagnosis Date  . Arthritis   . CAD (coronary artery disease)    Multivessel disease status post CABG 08/2015  . Essential hypertension   . Hyperlipidemia   . Hypertension   . Sleep apnea    uses CPAP  . Type 2 diabetes mellitus ([place])     Past Surgical History:  Procedure Laterality Date  . APPENDECTOMY    . Biceps tendon surgery Right   . CARDIAC CATHETERIZATION N/A 08/25/2015   Procedure: Left Heart Cath and Coronary Angiography;  Surgeon: Belva Crome, MD;  Location: [facility];  Service: Cardiovascular;  Laterality: N/A;  . CORONARY ARTERY BYPASS GRAFT N/A 08/29/2015   Procedure: CORONARY ARTERY BYPASS GRAFTING (CABG);  Surgeon: [name], MD;  Location: [facility];  Service: Open Heart Surgery;  Laterality: N/A;  . KNEE ARTHROSCOPY Left   . TEE WITHOUT CARDIOVERSION N/A 08/29/2015   Procedure: TRANSESOPHAGEAL ECHOCARDIOGRAM (TEE);  Surgeon: [name], MD;  Location: [facility];  Service: Open Heart Surgery;  Laterality: N/A;  . TOTAL KNEE ARTHROPLASTY Left 03/09/2017   Procedure: TOTAL KNEE  ARTHROPLASTY;  Surgeon: Carole Civil, MD;  Location: [facility];  Service: Orthopedics;  Laterality: Left;    There were no vitals filed for this visit.      Subjective Assessment - 04/13/17 0900    Subjective Pt stated knee is feeling good today, no reports of pain today.  Knee is stiff today.  Reports compliance with HEP and has increased focus on walking at home.     Pertinent History hx of NSTEMI, HTN, DM, CABG x4, hx of R knee scope    Patient Stated Goals as much ROM and flexibiity as he can get    Currently in Pain? No/denies            [facility] PT Assessment - 04/13/17 0001      Assessment   Medical Diagnosis L TKR    Referring Provider Arther Abbott   Onset Date/Surgical Date 03/09/17   Next MD Visit Dr. Aline Brochure May 11th    Prior Therapy [facility]      Precautions   Precautions None     AROM   Left Knee Extension 3   Left Knee Flexion 110                     [facility] Adult PT Treatment/Exercise - 04/13/17 0001      Knee/Hip Exercises: Stretches   Knee: Self-Stretch to increase Flexion Left   Knee: Self-Stretch Limitations 10 reps  5-10 sec hold each 12in step   Gastroc Stretch Both;3 reps;30 seconds   Gastroc Stretch Limitations slant board     Knee/Hip Exercises: Aerobic   Stationary Bike 5' full revolution seat 17 at beginning of session (no charge)     Knee/Hip Exercises: Standing   Heel Raises 15 reps   Heel Raises Limitations incline slope   Forward Lunges Both;15 reps   Forward Lunges Limitations 4in step with UE flexion   Terminal Knee Extension Limitations 15x GTB 5" holds   Lateral Step Up Left;10 reps;Step Height: 4";Hand Hold: 1   Forward Step Up Left;10 reps;Step Height: 4";Hand Hold: 1;Step Height: 6"     Knee/Hip Exercises: Supine   Short Arc Quad Sets 15 reps   Short Arc Quad Sets Limitations 3" holds   Heel Slides 10 reps   Heel Slides Limitations AAROM with Rt LE   Knee Extension Limitations 3   Knee Flexion Limitations 110      Manual Therapy   Manual Therapy Joint mobilization;Myofascial release;Passive ROM   Manual therapy comments manual completed separate rest of treatment   Joint Mobilization patella mobility all directions   Soft tissue mobilization scar mobilization superior/inferior; Lt/Rt. Lt distal quadriceps STM   Myofascial Release along scar line (restrictions throughout) and medial/lateral knee   Passive ROM PROM flexion and extension                  PT Short Term Goals - 03/30/17 1749      PT SHORT TERM GOAL #1   Title Patient to demonstrate knee ROM as being 0-110 degrees in order to improve mechanics and reduce pain    Time 3   Period Weeks   Status New     PT SHORT TERM GOAL #2   Title Patient to be independent in performing scar mobility, patella mobilzation, and regular icing routine in order to improve self-efficacy in managing condition    Time 3   Period Weeks   Status New     PT SHORT TERM GOAL #3   Title Patient to consistently be utilizing heel-toe gait pattern and able to ambulate with no device over uneven surfaces such as grass and gravel in order to improve general mobilty    Time 3   Period Weeks   Status New     PT SHORT TERM GOAL #4   Title Patient to be independent in correctly and consistently performing appropriate HEP, to be updated PRN    Time 1   Period Weeks   Status New           PT Long Term Goals - 03/30/17 1752      PT LONG TERM GOAL #1   Title Patient to demonstrate functional strength as being 5/5 in order to improve functional mechanics and general gait pattern    Time 6   Period Weeks   Status New     PT LONG TERM GOAL #2   Title Patient to be able to reciprocally ascend/descend 4 stairs with U railing, good eccentric control, minimal unsteadiness, in order to improve home and community access    Time 6   Period Weeks   Status New     PT LONG TERM GOAL #3   Title Patient to be able to ambulate 764ft during 3MWT in order  to improve mobilty and overall community access    Time 6   Period Weeks   Status New     PT LONG TERM  GOAL #4   Title Patient to be able to maintain SLS for at least 20 seconds each LE In order to show imrpoved balance    Time [place] - 04/13/17 2637    Clinical Impression Statement Pt progressing well with reports of compliance with HEP and pain free.  Continued manual myofascial technqiues to address restrictions especially on inferior portion of scar to improve knee mobilty.  AROM 3-110 degrees.  Pt able to make full revolution on bike seat 17.  Progressed functional strengthening with increased height wiht step up and began forward lunges without UE A for stabilty.  No reports of pain through session.     Rehab Potential Good   PT Frequency 3x / week   PT Duration 6 weeks   PT Treatment/Interventions ADLs/Self Care Home Management;Cryotherapy;DME Instruction;Gait training;Stair training;Functional mobility training;Therapeutic activities;Therapeutic exercise;Balance training;Neuromuscular re-education;Patient/family education;Manual techniques;Scar mobilization;Passive range of motion;Dry needling   PT Next Visit Plan Review goals and take ROM prior MD apt next week.  DO NOT USE ANKLE WEIGHTS OR CYBEX UNTIL ROM IS 0-110 DEGREES.  ROM based activities, gait training to improve knee flexion, scar mobilization.  Next session begin sit to stand and/or squats for functional strengthening.   PT Home Exercise Plan Eval: quad sets, self overpressure for knee extension in HS stretch sitting position, knee flexion stretch, regular walking       Patient will benefit from skilled therapeutic intervention in order to improve the following deficits and impairments:  Abnormal gait, Decreased skin integrity, Improper body mechanics, Decreased mobility, Decreased scar mobility, Decreased activity tolerance, Decreased range of motion, Decreased strength,  Hypomobility, Difficulty walking, Increased edema, Impaired flexibility  Visit Diagnosis: Stiffness of left knee, not elsewhere classified  Localized edema  Difficulty in walking, not elsewhere classified  Muscle weakness (generalized)     Problem List Patient Active Problem List   Diagnosis Date Noted  . Primary osteoarthritis of left knee 03/09/2017  . History of nocturia 03/02/2017  . OSA on CPAP 03/02/2016  . Hypoxia, sleep related 03/02/2016  . Cyst of mediastinum 01/15/2016  . S/P CABG x 4 08/29/2015  . Coronary artery disease involving native coronary artery with other forms of angina pectoris   . Morbid obesity ([place])   . Hyperglycemia   . Chronic systolic HF (heart failure) ([place])   . Pain in the chest 08/23/2015  . Non-ST elevation MI (NSTEMI) ([place]) 08/23/2015  . Diabetes mellitus type 2, uncontrolled ([place]) 08/23/2015  . Hypertension 08/23/2015  . Hyperlipidemia 08/23/2015  . Osteoarthritis, knee 05/24/2012  . Knee pain 10/29/2011  . Knee stiffness 10/29/2011  . S/P right knee arthroscopy 10/26/2011  . Medial meniscus, posterior horn derangement 09/07/2011  . Lateral meniscus derangement 09/07/2011  . OA (osteoarthritis) of knee 09/07/2011  . Rotator cuff syndrome of left shoulder 08/19/2011  . Right knee meniscal tear 08/19/2011   Ihor Austin, LPTA; CBIS [phone]  Aldona Lento 04/13/2017, [facility] [address] [place], [place], [address] Phone: [phone]   Fax:  [phone]  Name: BRYDEN DARDEN MRN: [id number] Date of Birth: 02/24/1961

## 2017-04-15 ENCOUNTER — Ambulatory Visit (HOSPITAL_COMMUNITY): Payer: Federal, State, Local not specified - PPO

## 2017-04-15 DIAGNOSIS — M6281 Muscle weakness (generalized): Secondary | ICD-10-CM | POA: Diagnosis not present

## 2017-04-15 DIAGNOSIS — M25662 Stiffness of left knee, not elsewhere classified: Secondary | ICD-10-CM | POA: Diagnosis not present

## 2017-04-15 DIAGNOSIS — R262 Difficulty in walking, not elsewhere classified: Secondary | ICD-10-CM | POA: Diagnosis not present

## 2017-04-15 DIAGNOSIS — R600 Localized edema: Secondary | ICD-10-CM | POA: Diagnosis not present

## 2017-04-15 NOTE — Therapy (Addendum)
PHYSICAL THERAPY DISCHARGE SUMMARY  Visits from Start of Care: 8  Current functional level related to goals / functional outcomes: *see detail below; pt did not return after this visit, hence no formal re-assessment could be performed.    Remaining deficits: *see below    Education / Equipment: Not applicable at this time.  Plan: Patient agrees to discharge.  Patient goals were not met. Patient is being discharged due to not returning since the last visit.  ?????          8:25 AM, 07/18/17 Etta Grandchild, PT, DPT Physical Therapist - [facility] [phone] (Office)     -------------------------------------------------------------------------------------------------------------------------------------   [facility] [address] [place], [place], [address] Phone: [phone]   Fax:  [phone]  Physical Therapy Treatment  Patient Details  Name: [name] MRN: [id number] Date of Birth: Oct 01, 1961 Referring Provider: Arther Abbott  Encounter Date: 04/15/2017      PT End of Session - 04/15/17 0922    Visit Number 8   Number of Visits 19   Date for PT Re-Evaluation 04/20/17   Authorization Type BCBS/Federal Employee PPO    Authorization Time Period 03/30/17 to 05/11/17   Authorization - Visit Number 8   Authorization - Number of Visits 10   PT Start Time 0903   PT Stop Time 0941   PT Time Calculation (min) 38 min   Activity Tolerance Patient tolerated treatment well;No increased pain   Behavior During Therapy WFL for tasks assessed/performed      Past Medical History:  Diagnosis Date  . Arthritis   . CAD (coronary artery disease)    Multivessel disease status post CABG 08/2015  . Essential hypertension   . Hyperlipidemia   . Hypertension   . Sleep apnea    uses CPAP  . Type 2 diabetes mellitus ([place])     Past Surgical History:  Procedure Laterality Date  . APPENDECTOMY     . Biceps tendon surgery Right   . CARDIAC CATHETERIZATION N/A 08/25/2015   Procedure: Left Heart Cath and Coronary Angiography;  Surgeon: Belva Crome, MD;  Location: [facility];  Service: Cardiovascular;  Laterality: N/A;  . CORONARY ARTERY BYPASS GRAFT N/A 08/29/2015   Procedure: CORONARY ARTERY BYPASS GRAFTING (CABG);  Surgeon: [name], MD;  Location: [facility];  Service: Open Heart Surgery;  Laterality: N/A;  . KNEE ARTHROSCOPY Left   . TEE WITHOUT CARDIOVERSION N/A 08/29/2015   Procedure: TRANSESOPHAGEAL ECHOCARDIOGRAM (TEE);  Surgeon: [name], MD;  Location: [facility];  Service: Open Heart Surgery;  Laterality: N/A;  . TOTAL KNEE ARTHROPLASTY Left 03/09/2017   Procedure: TOTAL KNEE ARTHROPLASTY;  Surgeon: Carole Civil, MD;  Location: [facility];  Service: Orthopedics;  Laterality: Left;    There were no vitals filed for this visit.      Subjective Assessment - 04/15/17 0908    Subjective Pt reports things are going well so far. He says exercises are goign well with lots of support from family.    Pertinent History hx of NSTEMI, HTN, DM, CABG x4, hx of R knee scope    Currently in Pain? Yes   Pain Score 2    Pain Location --  Left TKA syrgical site                          [facility] Adult PT Treatment/Exercise - 04/15/17 0001  Ambulation/Gait   Ambulation Distance (Feet) 450 Feet   Assistive device None   Gait Pattern Step-through pattern   Gait velocity 1.33ms   Gait Comments noted left hip fatigue after 4057f with tredelenburg gait and lt abducted gait     Knee/Hip Exercises: Stretches   Passive Hamstring Stretch Left;3 reps;30 seconds   Passive Hamstring Stretch Limitations --  Prone knee hang: 3x30sec 2lb   Quad Stretch 3 reps;30 seconds;Left   Quad Stretch Limitations prone wiht sheet   Gastroc Stretch Both;3 reps;30 seconds   Gastroc Stretch Limitations slant board     Knee/Hip Exercises: Standing   SLS 10x  alteranting bilat, with contralat hip flexion to 90*  1x10x3sH    Other Standing Knee Exercises Lateral side stepping on blue line:   2x each direction, 4lb ankle weights      Knee/Hip Exercises: Seated   Long Arc Quad Left;AROM;15 reps;2 sets;Weights   Long Arc Quad Weight 2 lbs.  for joint proprioception   Long Arc Quad Limitations Excellent access of TKE, range limited but not by quads lag.      Knee/Hip Exercises: Supine   Knee Extension Limitations 9   Knee Flexion Limitations 110     Knee/Hip Exercises: Prone   Hamstring Curl 2 sets;15 reps   Hamstring Curl Limitations 2lb for improved proprioception                  PT Short Term Goals - 04/15/17 0927      PT SHORT TERM GOAL #1   Title Patient to demonstrate knee ROM as being 0-110 degrees in order to improve mechanics and reduce pain    Time 3   Period Weeks   Status Achieved     PT SHORT TERM GOAL #2   Title Patient to be independent in performing scar mobility, patella mobilzation, and regular icing routine in order to improve self-efficacy in managing condition    Time 3   Period Weeks   Status Achieved     PT SHORT TERM GOAL #3   Title Patient to consistently be utilizing heel-toe gait pattern and able to ambulate with no device over uneven surfaces such as grass and gravel in order to improve general mobilty    Time 3   Period Weeks   Status Achieved     PT SHORT TERM GOAL #4   Title Patient to be independent in correctly and consistently performing appropriate HEP, to be updated PRN    Time 1   Period Weeks   Status Achieved           PT Long Term Goals - 03/30/17 1752      PT LONG TERM GOAL #1   Title Patient to demonstrate functional strength as being 5/5 in order to improve functional mechanics and general gait pattern    Time 6   Period Weeks   Status New     PT LONG TERM GOAL #2   Title Patient to be able to reciprocally ascend/descend 4 stairs with U railing, good eccentric  control, minimal unsteadiness, in order to improve home and community access    Time 6   Period Weeks   Status New     PT LONG TERM GOAL #3   Title Patient to be able to ambulate 70079furing 3MWT in order to improve mobilty and overall community access    Time 6   Period Weeks   Status New     PT LONG TERM GOAL #  4   Title Patient to be able to maintain SLS for at least 20 seconds each LE In order to show imrpoved balance    Time [place] - 04/15/17 2297    Clinical Impression Statement Session with continued focus on mobility adn muscle activation. Pt making good progress toward goals overall. TKE remain minimally limited in ROM. Pain is well controlled. Noted onset of left hip fatigue after 49f AMB, hence more left hip strength/balance work is needed.    Rehab Potential Good   PT Frequency 3x / week   PT Duration 6 weeks   PT Treatment/Interventions ADLs/Self Care Home Management;Cryotherapy;DME Instruction;Gait training;Stair training;Functional mobility training;Therapeutic activities;Therapeutic exercise;Balance training;Neuromuscular re-education;Patient/family education;Manual techniques;Scar mobilization;Passive range of motion;Dry needling   PT Next Visit Plan DO NOT USE ANKLE WEIGHTS OR CYBEX UNTIL ROM IS 0-110 DEGREES.  ROM based activities, gait training to improve knee flexion, scar mobilization.  Continue sit to stand and/or squats for functional strengthening.   PT Home Exercise Plan Eval: quad sets, self overpressure for knee extension in HS stretch sitting position, knee flexion stretch, regular walking    Consulted and Agree with Plan of Care Patient      Patient will benefit from skilled therapeutic intervention in order to improve the following deficits and impairments:  Abnormal gait, Decreased skin integrity, Improper body mechanics, Decreased mobility, Decreased scar mobility, Decreased activity tolerance,  Decreased range of motion, Decreased strength, Hypomobility, Difficulty walking, Increased edema, Impaired flexibility  Visit Diagnosis: Stiffness of left knee, not elsewhere classified  Localized edema  Difficulty in walking, not elsewhere classified  Muscle weakness (generalized)     Problem List Patient Active Problem List   Diagnosis Date Noted  . Primary osteoarthritis of left knee 03/09/2017  . History of nocturia 03/02/2017  . OSA on CPAP 03/02/2016  . Hypoxia, sleep related 03/02/2016  . Cyst of mediastinum 01/15/2016  . S/P CABG x 4 08/29/2015  . Coronary artery disease involving native coronary artery with other forms of angina pectoris   . Morbid obesity ([place]   . Hyperglycemia   . Chronic systolic HF (heart failure) ([place]   . Pain in the chest 08/23/2015  . Non-ST elevation MI (NSTEMI) ([place] 08/23/2015  . Diabetes mellitus type 2, uncontrolled ([place] 08/23/2015  . Hypertension 08/23/2015  . Hyperlipidemia 08/23/2015  . Osteoarthritis, knee 05/24/2012  . Knee pain 10/29/2011  . Knee stiffness 10/29/2011  . S/P right knee arthroscopy 10/26/2011  . Medial meniscus, posterior horn derangement 09/07/2011  . Lateral meniscus derangement 09/07/2011  . OA (osteoarthritis) of knee 09/07/2011  . Rotator cuff syndrome of left shoulder 08/19/2011  . Right knee meniscal tear 08/19/2011    9:41 AM, 04/15/17 AEtta Grandchild PT, DPT [facility] Therap[phone](office)     [facility] StreetSPecan Grove NAl[place] [address]Phone: [phone]  Fax:  [phone] Name: ANATHANIEL WAKELEYMRN: [id number] of Birth: 1Feb 23, 1962

## 2017-04-22 ENCOUNTER — Encounter: Payer: Self-pay | Admitting: Orthopedic Surgery

## 2017-04-22 ENCOUNTER — Ambulatory Visit (INDEPENDENT_AMBULATORY_CARE_PROVIDER_SITE_OTHER): Payer: Federal, State, Local not specified - PPO | Admitting: Orthopedic Surgery

## 2017-04-22 DIAGNOSIS — Z4889 Encounter for other specified surgical aftercare: Secondary | ICD-10-CM

## 2017-04-22 DIAGNOSIS — Z96652 Presence of left artificial knee joint: Secondary | ICD-10-CM

## 2017-04-22 MED ORDER — METHOCARBAMOL 500 MG PO TABS: 500.0000 mg | ORAL_TABLET | Freq: Four times a day (QID) | ORAL | 1 refills | Status: DC | PRN

## 2017-04-22 MED ORDER — HYDROCODONE-ACETAMINOPHEN 5-325 MG PO TABS: 1.0000 | ORAL_TABLET | Freq: Three times a day (TID) | ORAL | 0 refills | Status: DC | PRN

## 2017-04-22 NOTE — Progress Notes (Signed)
POST OP   Encounter Diagnoses  Name Primary?  [name] Aftercare following surgery Yes  . Status post total left knee replacement    POD 20  C/O PAIN AT NIGHT   HE HAS RETURNED TO WORK   ROM 3-105  [employer]   Meds ordered this encounter  Medications  . HYDROcodone-acetaminophen (NORCO/VICODIN) 5-325 MG tablet    Sig: Take 1 tablet by mouth every 8 (eight) hours as needed for moderate pain.    Dispense:  42 tablet    Refill:  0  . methocarbamol (ROBAXIN) 500 MG tablet    Sig: Take 1 tablet (500 mg total) by mouth every 6 (six) hours as needed for muscle spasms.    Dispense:  56 tablet    Refill:  1    CONTINUE THERAPY RETURN IN 5 WEEK S

## 2017-05-23 DIAGNOSIS — R05 Cough: Secondary | ICD-10-CM | POA: Diagnosis not present

## 2017-05-23 DIAGNOSIS — Z6841 Body Mass Index (BMI) 40.0 and over, adult: Secondary | ICD-10-CM | POA: Diagnosis not present

## 2017-05-24 ENCOUNTER — Encounter: Payer: Self-pay | Admitting: Cardiology

## 2017-05-24 ENCOUNTER — Ambulatory Visit (INDEPENDENT_AMBULATORY_CARE_PROVIDER_SITE_OTHER): Payer: Federal, State, Local not specified - PPO | Admitting: Cardiology

## 2017-05-24 VITALS — BP 90/60 | HR 94 | Ht 70.0 in | Wt 254.0 lb

## 2017-05-24 DIAGNOSIS — I2510 Atherosclerotic heart disease of native coronary artery without angina pectoris: Secondary | ICD-10-CM | POA: Diagnosis not present

## 2017-05-24 DIAGNOSIS — I959 Hypotension, unspecified: Secondary | ICD-10-CM

## 2017-05-24 DIAGNOSIS — I10 Essential (primary) hypertension: Secondary | ICD-10-CM | POA: Diagnosis not present

## 2017-05-24 DIAGNOSIS — E782 Mixed hyperlipidemia: Secondary | ICD-10-CM | POA: Diagnosis not present

## 2017-05-24 NOTE — Progress Notes (Signed)
Cardiology Office Note  Date: 05/24/2017   ID: [name], DOB [birth year], MRN [id number]  PCP: [name], MD  Primary Cardiologist: Rozann Lesches, MD   Chief Complaint  Patient presents with  . Coronary Artery Disease    History of Present Illness: [name] is a 56 y.o. male last seen in July 2017.He presents for a follow-up visit, saw his PCP recently with upper respiratory symptoms and noted to have low blood pressure. He has had no dizziness or syncope, no chest pain or unusual shortness of breath. He reports compliance with his medications. He did undergo left knee replacement in the interim since our last visit, has done very well and is back to baseline. States that he may undergo right knee replacement in the fall.  Current medications include Benicar, Pravachol, Lopressor, aspirin, HCTZ, and Lasix. He does not report any leg swelling, orthopnea or PND. Echocardiogram from 2016 revealed LVEF 45-50% range. His weight is down about 15 pounds from last year.  I personally reviewed his ECG today which shows sinus rhythm with borderline low voltage, poor R-wave progression, nonspecific T-wave changes.  Past Medical History:  Diagnosis Date  . Arthritis   . CAD (coronary artery disease)    Multivessel disease status post CABG 08/2015  . Essential hypertension   . Hyperlipidemia   . Hypertension   . OSA on CPAP   . Type 2 diabetes mellitus ([place])     Past Surgical History:  Procedure Laterality Date  . APPENDECTOMY    . Biceps tendon surgery Right   . CARDIAC CATHETERIZATION N/A 08/25/2015   Procedure: Left Heart Cath and Coronary Angiography;  Surgeon: Belva Crome, MD;  Location: [facility];  Service: Cardiovascular;  Laterality: N/A;  . CORONARY ARTERY BYPASS GRAFT N/A 08/29/2015   Procedure: CORONARY ARTERY BYPASS GRAFTING (CABG);  Surgeon: [name], MD;  Location: [facility];  Service: Open Heart Surgery;  Laterality: N/A;  .  KNEE ARTHROSCOPY Left   . TEE WITHOUT CARDIOVERSION N/A 08/29/2015   Procedure: TRANSESOPHAGEAL ECHOCARDIOGRAM (TEE);  Surgeon: [name], MD;  Location: [facility];  Service: Open Heart Surgery;  Laterality: N/A;  . TOTAL KNEE ARTHROPLASTY Left 03/09/2017   Procedure: TOTAL KNEE ARTHROPLASTY;  Surgeon: Carole Civil, MD;  Location: [facility];  Service: Orthopedics;  Laterality: Left;    Current Outpatient Prescriptions  Medication Sig Dispense Refill  . aspirin EC 325 MG EC tablet Take 1 tablet (325 mg total) by mouth daily. 30 tablet 0  . diphenhydrAMINE (BENADRYL) 50 MG tablet Take 1 tablet (50 mg total) by mouth at bedtime as needed for itching. 30 tablet 0  . furosemide (LASIX) 20 MG tablet Take 20 mg by mouth daily.     . Garlic 2536 MG CAPS Take 1,000 mg by mouth daily.     . hydrochlorothiazide (HYDRODIURIL) 25 MG tablet 12.5 mg  twice daily  0  . HYDROcodone-acetaminophen (NORCO/VICODIN) 5-325 MG tablet Take 1 tablet by mouth every 8 (eight) hours as needed for moderate pain. 42 tablet 0  . insulin lispro protamine-lispro (HUMALOG 75/25 MIX) (75-25) 100 UNIT/ML SUSP injection Inject 70 Units into the skin 2 (two) times daily with a meal.     . KOMBIGLYZE XR 2.04-999 MG TB24 Take 1 tablet by mouth 2 (two) times daily.     [name] loratadine (CLARITIN) 10 MG tablet Take 10 mg by mouth daily.    . meloxicam (MOBIC) 15 MG tablet Take 15  mg by mouth daily.    . methocarbamol (ROBAXIN) 500 MG tablet Take 1 tablet (500 mg total) by mouth every 6 (six) hours as needed for muscle spasms. 56 tablet 1  . metoprolol tartrate (LOPRESSOR) 25 MG tablet Take 12.5 mg by mouth 2 (two) times daily.    . metoprolol tartrate (LOPRESSOR) 25 MG tablet TAKE 1/2 TABLET(12.5 MG) BY MOUTH TWICE DAILY 90 tablet 3  . olmesartan (BENICAR) 40 MG tablet Take 1 tablet (40 mg total) by mouth daily. 90 tablet 3  . omeprazole (PRILOSEC) 40 MG capsule Take 40 mg by mouth daily.     . pravastatin (PRAVACHOL) 20 MG tablet  TAKE 1 TABLET(20 MG) BY MOUTH AT BEDTIME 90 tablet 3  . TURMERIC CURCUMIN PO Take 1 tablet by mouth daily.      No current facility-administered medications for this visit.    Allergies:  Patient has no known allergies.   Social History: The patient  reports that he has never smoked. He has never used smokeless tobacco. He reports that he does not drink alcohol or use drugs.   ROS:  Please see the history of present illness. Otherwise, complete review of systems is positive for improved left knees soreness and stiffness.  All other systems are reviewed and negative.   Physical Exam: VS:  BP 90/60   Pulse 94   Ht 5\' 10"  (1.778 m)   Wt 254 lb (115.2 kg)   SpO2 98%   BMI 36.45 kg/m , BMI Body mass index is 36.45 kg/m.  Wt Readings from Last 3 Encounters:  05/24/17 254 lb (115.2 kg)  03/30/17 257 lb (116.6 kg)  03/09/17 271 lb 2.7 oz (123 kg)    General: Obese male, appears comfortable at rest. HEENT: Conjunctiva and lids normal, oropharynx clear. Neck: Supple, no elevated JVP or carotid bruits, no thyromegaly. Lungs: Clear to auscultation, nonlabored breathing at rest. Cardiac: Regular rate and rhythm, no S3 or significant systolic murmur, no pericardial rub. Abdomen: Soft, nontender, bowel sounds present, no guarding or rebound. Extremities: No pitting edema, distal pulses 2+.Well-healed incision anterior left knee. Skin: Warm and dry. Musculoskeletal: No kyphosis. Neuropsychiatric: Alert and oriented x3, affect grossly appropriate.  ECG: I personally reviewed the tracing from 07/08/2016 which showed sinus rhythm with decreased R wave progression and low voltage.  Recent Labwork: 03/10/2017: BUN 14; Creatinine, Ser 1.19; Potassium 3.8; Sodium 131 03/12/2017: Hemoglobin 11.3; Platelets 132     Component Value Date/Time   CHOL 166 12/18/2015 0819   TRIG 205 (H) 12/18/2015 0819   HDL 35 (L) 12/18/2015 0819   CHOLHDL 4.7 12/18/2015 0819   VLDL 41 (H) 12/18/2015 0819   LDLCALC  90 12/18/2015 0819    Other Studies Reviewed Today:  Echocardiogram 08/24/2015: Study Conclusions  - Left ventricle: The cavity size was normal. Wall thickness was increased in a pattern of mild LVH. Systolic function was mildly reduced. The estimated ejection fraction was in the range of 45% to 50%. Left ventricular diastolic function parameters were normal.  Assessment and Plan:  1. Relative hypotension, patient not particularly symptomatic at this time. Plan will be to stop HCTZ and Lasix for now. We will continue remaining cardiac regimen. Previously LVEF had been 45-50% range around the time of CABG in 2016, follow-up echocardiogram be obtained. We may not need to resume diuretic therapy going forward.  2. Multivessel CAD status post CABG in 2016. He has recovered well, no active angina symptoms. Continue medical therapy as discussed above. Reduce aspirin  to 81 mg daily.  3. Hyperlipidemia, on Pravachol. He follows with Dr. Hilma Favors. LDL was 90 last year.  4. History of essential hypertension. Stopping diuretics as noted above. We will try to keep him on Benicar and Lopressor given cardiac history, although if his blood pressure remains low this may need to be adjusted as well.  Current medicines were reviewed with the patient today.   Orders Placed This Encounter  Procedures  . EKG 12-Lead    Disposition: Follow-up in 6 months.  Signed, Satira Sark, MD, [facility] 05/24/2017 8:57 AM    [facility] [place]. [address] [place], [place] [address] Phone: [phone]; Fax: [phone]

## 2017-05-24 NOTE — Patient Instructions (Signed)
Your physician wants you to follow-up in: 6 months Dr Ferne Reus will receive a reminder letter in the mail two months in advance. If you don't receive a letter, please call our office to schedule the follow-up appointment.   DECREASE Aspirin to 81 mg daily   HOLD LASIX AND HCTZ UNTIL FURTHER NOTICE   Your physician has requested that you have an echocardiogram. Echocardiography is a painless test that uses sound waves to create images of your heart. It provides your doctor with information about the size and shape of your heart and how well your heart's chambers and valves are working. This procedure takes approximately one hour. There are no restrictions for this procedure.     Thank you for choosing [facility] !

## 2017-05-26 ENCOUNTER — Telehealth: Payer: Self-pay

## 2017-05-26 ENCOUNTER — Ambulatory Visit (HOSPITAL_COMMUNITY)
Admission: RE | Admit: 2017-05-26 | Discharge: 2017-05-26 | Disposition: A | Discharge status: Home / Self Care | Payer: Federal, State, Local not specified - PPO | Source: Ambulatory Visit | Attending: Cardiology | Admitting: Cardiology

## 2017-05-26 DIAGNOSIS — I2510 Atherosclerotic heart disease of native coronary artery without angina pectoris: Secondary | ICD-10-CM | POA: Diagnosis not present

## 2017-05-26 DIAGNOSIS — I340 Nonrheumatic mitral (valve) insufficiency: Secondary | ICD-10-CM | POA: Insufficient documentation

## 2017-05-26 DIAGNOSIS — I252 Old myocardial infarction: Secondary | ICD-10-CM | POA: Insufficient documentation

## 2017-05-26 DIAGNOSIS — E785 Hyperlipidemia, unspecified: Secondary | ICD-10-CM | POA: Insufficient documentation

## 2017-05-26 DIAGNOSIS — I119 Hypertensive heart disease without heart failure: Secondary | ICD-10-CM | POA: Insufficient documentation

## 2017-05-26 LAB — ECHOCARDIOGRAM COMPLETE
E decel time: 275 msec
E/e' ratio: 7.47
FS: 21 % — AB (ref 28–44)
IVS/LV PW RATIO, ED: 0.66
LA ID, A-P, ES: 35 mm
LA diam end sys: 35 mm
LA diam index: 1.52 cm/m2
LA vol A4C: 33.4 ml
LA vol index: 13.9 mL/m2
LA vol: 32.1 mL
LV E/e' medial: 7.47
LV E/e'average: 7.47
LV PW d: 15.3 mm — AB (ref 0.6–1.1)
LV e' LATERAL: 11.1 cm/s
LVOT area: 3.14 cm2
LVOT diameter: 20 mm
Lateral S' vel: 8.92 cm/s
MV Dec: 275
MV Peak grad: 3 mmHg
MV pk A vel: 64.5 m/s
MV pk E vel: 82.9 m/s
TAPSE: 13.2 mm
TDI e' lateral: 11.1
TDI e' medial: 6.96

## 2017-05-26 MED ORDER — PERFLUTREN LIPID MICROSPHERE
1.0000 mL | INTRAVENOUS | Status: AC | PRN
Start: 2017-05-26 — End: 2017-05-26
  Administered 2017-05-26: 1 mL via INTRAVENOUS
  Filled 2017-05-26: qty 10

## 2017-05-26 MED ORDER — PERFLUTREN LIPID MICROSPHERE
1.0000 mL | INTRAVENOUS | Status: DC | PRN
  Filled 2017-05-26: qty 10

## 2017-05-26 NOTE — Telephone Encounter (Signed)
Patient made aware, d/c'd Lasix and HCTZ

## 2017-05-26 NOTE — Telephone Encounter (Signed)
-----   Message from Satira Sark, MD sent at 05/26/2017  3:46 PM EDT ----- Results reviewed. Study shows normalization of LVEF now in the range of 55-60%. In light of this and his recent low blood pressure, would hold off resuming any diuretics. A copy of this test should be forwarded to [name], MD.

## 2017-05-26 NOTE — Progress Notes (Signed)
*  PRELIMINARY RESULTS* Echocardiogram 2D Echocardiogram with definity has been performed.  Leavy Cella 05/26/2017, 2:14 PM

## 2017-05-27 ENCOUNTER — Ambulatory Visit (INDEPENDENT_AMBULATORY_CARE_PROVIDER_SITE_OTHER): Payer: Federal, State, Local not specified - PPO | Admitting: Orthopedic Surgery

## 2017-05-27 ENCOUNTER — Encounter: Payer: Self-pay | Admitting: Orthopedic Surgery

## 2017-05-27 DIAGNOSIS — Z96652 Presence of left artificial knee joint: Secondary | ICD-10-CM

## 2017-05-27 DIAGNOSIS — Z4889 Encounter for other specified surgical aftercare: Secondary | ICD-10-CM

## 2017-05-27 MED ORDER — METHOCARBAMOL 500 MG PO TABS: 500.0000 mg | ORAL_TABLET | Freq: Four times a day (QID) | ORAL | 1 refills | Status: DC | PRN

## 2017-05-27 MED ORDER — HYDROCODONE-ACETAMINOPHEN 5-325 MG PO TABS: 1.0000 | ORAL_TABLET | Freq: Three times a day (TID) | ORAL | 0 refills | Status: DC | PRN

## 2017-05-27 NOTE — Progress Notes (Signed)
Follow up post op period  Patient ID: [name], male   DOB: 1961-06-19, 56 y.o.   MRN: [id number]  Chief Complaint  Patient presents with  . Follow-up    LEFT TKA, DOS 03/09/17    [name] is in his 10th week after a left total knee. He has done well so far. He does have some residual pain and stiffness.      Review of Systems  Constitutional: Negative for chills and fever.    Gen. appearance is normal grooming and hygiene normal Orientation to person place and time normal Mood normal   Ortho Exam The incision is nice and healed. There is no erythema. There is no swelling or tenderness around the knee. The knee is stable in all planes. His range of motion is 5-110   A/P  Medical decision-making  Encounter Diagnoses  Name Primary?  [name] Aftercare following surgery   . Status post total left knee replacement      Meds ordered this encounter  Medications  . HYDROcodone-acetaminophen (NORCO/VICODIN) 5-325 MG tablet    Sig: Take 1 tablet by mouth every 8 (eight) hours as needed for moderate pain.    Dispense:  42 tablet    Refill:  0  . methocarbamol (ROBAXIN) 500 MG tablet    Sig: Take 1 tablet (500 mg total) by mouth every 6 (six) hours as needed for muscle spasms.    Dispense:  56 tablet    Refill:  1     Plan is for him to continue strengthening exercises work on his range of motion, return in 3 months to discuss his other knee     Arther Abbott, MD 05/27/2017 8:43 AM

## 2017-06-03 ENCOUNTER — Telehealth: Payer: Self-pay | Admitting: Cardiology

## 2017-06-03 ENCOUNTER — Telehealth: Payer: Self-pay | Admitting: Nurse Practitioner

## 2017-06-03 MED ORDER — FUROSEMIDE 20 MG PO TABS: ORAL_TABLET | ORAL | 3 refills | Status: DC

## 2017-06-03 NOTE — Addendum Note (Signed)
Addended by: Barbarann Ehlers A on: 06/03/2017 12:38 PM   Modules accepted: Orders

## 2017-06-03 NOTE — Telephone Encounter (Signed)
Pt will weight self daily and monitor BP

## 2017-06-03 NOTE — Telephone Encounter (Signed)
Dr. Domenic Polite recently stopped both his HCTZ and Lasix due to low blood pressures.  If he has had return of swelling, the happy medium may be to allow for him to take lasix 20 mg daily as needed for wt gain or swelling.

## 2017-06-03 NOTE — Telephone Encounter (Signed)
Please call patient about medication concerns / tg

## 2017-06-03 NOTE — Telephone Encounter (Signed)
Patient states that last time he was taken off HCTZ , within a week he had leg swelling.He states the same thing has happened again.His weight is 251 lbs which is down from OV of 254 lbs.His BP is 95/65 but he has swelling in both legs he states up to his knees.

## 2017-06-30 DIAGNOSIS — K080 Exfoliation of teeth due to systemic causes: Secondary | ICD-10-CM | POA: Diagnosis not present

## 2017-07-06 ENCOUNTER — Telehealth: Payer: Self-pay | Admitting: Orthopedic Surgery

## 2017-07-06 ENCOUNTER — Other Ambulatory Visit: Payer: Self-pay | Admitting: Orthopedic Surgery

## 2017-07-06 DIAGNOSIS — Z4889 Encounter for other specified surgical aftercare: Secondary | ICD-10-CM

## 2017-07-06 DIAGNOSIS — Z96652 Presence of left artificial knee joint: Secondary | ICD-10-CM

## 2017-07-06 MED ORDER — HYDROCODONE-ACETAMINOPHEN 5-325 MG PO TABS: 1.0000 | ORAL_TABLET | Freq: Three times a day (TID) | ORAL | 0 refills | Status: DC | PRN

## 2017-07-06 NOTE — Telephone Encounter (Signed)
Patient requests refill,  HYDROcodone-acetaminophen (NORCO/VICODIN) 5-325 MG tablet 42 tablet

## 2017-07-06 NOTE — Telephone Encounter (Signed)
Routing to Dr Harrison 

## 2017-07-07 DIAGNOSIS — K080 Exfoliation of teeth due to systemic causes: Secondary | ICD-10-CM | POA: Diagnosis not present

## 2017-07-20 DIAGNOSIS — K080 Exfoliation of teeth due to systemic causes: Secondary | ICD-10-CM | POA: Diagnosis not present

## 2017-07-29 DIAGNOSIS — Z1389 Encounter for screening for other disorder: Secondary | ICD-10-CM | POA: Diagnosis not present

## 2017-07-29 DIAGNOSIS — J069 Acute upper respiratory infection, unspecified: Secondary | ICD-10-CM | POA: Diagnosis not present

## 2017-07-29 DIAGNOSIS — Z6841 Body Mass Index (BMI) 40.0 and over, adult: Secondary | ICD-10-CM | POA: Diagnosis not present

## 2017-07-29 DIAGNOSIS — L259 Unspecified contact dermatitis, unspecified cause: Secondary | ICD-10-CM | POA: Diagnosis not present

## 2017-08-07 ENCOUNTER — Other Ambulatory Visit: Payer: Self-pay | Admitting: Orthopedic Surgery

## 2017-08-22 ENCOUNTER — Telehealth: Payer: Self-pay | Admitting: Orthopedic Surgery

## 2017-08-22 ENCOUNTER — Other Ambulatory Visit: Payer: Self-pay | Admitting: Orthopedic Surgery

## 2017-08-22 MED ORDER — TRAMADOL-ACETAMINOPHEN 37.5-325 MG PO TABS: 1.0000 | ORAL_TABLET | ORAL | 5 refills | Status: DC | PRN

## 2017-08-22 NOTE — Telephone Encounter (Signed)
Routing to dr Aline Brochure

## 2017-08-22 NOTE — Telephone Encounter (Signed)
Patient requests refill on the following medications:   1.  Hydrocodone/Acetaminophen 5-325  Mgs.   Qty  30 Sig: Take 1 tablet by mouth every 8 (eight) hours as needed for moderate pain.   2.  Methocarbamol 500 mgs.  Qty 56  Sig: Take 1 tablet (500 mg total) by mouth every 6 (six) hours as needed for muscle spasms.         3.  Tramadol (Ultram) 50 mgs.   Qty  60 Sig: TAKE 1 TABLET BY MOUTH EVERY 6 HOURS AS NEEDED

## 2017-08-23 DIAGNOSIS — I10 Essential (primary) hypertension: Secondary | ICD-10-CM | POA: Diagnosis not present

## 2017-08-23 DIAGNOSIS — N189 Chronic kidney disease, unspecified: Secondary | ICD-10-CM | POA: Diagnosis not present

## 2017-08-23 DIAGNOSIS — E1165 Type 2 diabetes mellitus with hyperglycemia: Secondary | ICD-10-CM | POA: Diagnosis not present

## 2017-08-23 DIAGNOSIS — E7800 Pure hypercholesterolemia, unspecified: Secondary | ICD-10-CM | POA: Diagnosis not present

## 2017-08-26 ENCOUNTER — Ambulatory Visit: Payer: Federal, State, Local not specified - PPO | Admitting: Orthopedic Surgery

## 2017-08-30 ENCOUNTER — Encounter: Payer: Self-pay | Admitting: Orthopedic Surgery

## 2017-08-30 ENCOUNTER — Ambulatory Visit (INDEPENDENT_AMBULATORY_CARE_PROVIDER_SITE_OTHER): Payer: Federal, State, Local not specified - PPO | Admitting: Orthopedic Surgery

## 2017-08-30 ENCOUNTER — Ambulatory Visit: Payer: Federal, State, Local not specified - PPO | Admitting: Orthopedic Surgery

## 2017-08-30 VITALS — BP 108/72 | HR 94 | Ht 70.0 in | Wt 248.0 lb

## 2017-08-30 DIAGNOSIS — Z96652 Presence of left artificial knee joint: Secondary | ICD-10-CM

## 2017-08-30 DIAGNOSIS — M25561 Pain in right knee: Secondary | ICD-10-CM

## 2017-08-30 DIAGNOSIS — G8929 Other chronic pain: Secondary | ICD-10-CM | POA: Diagnosis not present

## 2017-08-30 DIAGNOSIS — Z4889 Encounter for other specified surgical aftercare: Secondary | ICD-10-CM

## 2017-08-30 MED ORDER — METHOCARBAMOL 500 MG PO TABS
500.0000 mg | ORAL_TABLET | Freq: Four times a day (QID) | ORAL | 1 refills | Status: DC | PRN
Start: 2017-08-30 — End: 2017-11-22

## 2017-08-30 MED ORDER — TRAMADOL-ACETAMINOPHEN 37.5-325 MG PO TABS: 1.0000 | ORAL_TABLET | ORAL | 5 refills | Status: DC | PRN

## 2017-08-30 NOTE — Patient Instructions (Signed)
What You Need to Know About Prescription Opioid Pain Medicine        Please be advised. You are on a medication which is classified as an "opiod". The CDC the [facility]  has recently advised all providers to advise patient's that these medications have certain risks which include but are not limited to:    drug intolerance  drug addiction  respiratory depression   respiratory failure  Death  Please keep these medications locked away. If you feel that you are becoming addicted to these medicines or you are having difficulties with these medications please alert your provider.   As your provider I will attempt to wean you off of these medications when you're severe acute pain has been taking care of. However, if we cannot wean you off of this medication you will be sent to a pain management center where they can better manage chronic pain   Opioids are powerful medicines that are used to treat moderate to severe pain. Opioids should be taken with the supervision of a trained health care provider. They should be taken for the shortest period of time as possible. This is because opioids can be addictive and the longer you take opioids, the greater your risk of addiction (opioid use disorder). What do opioids do? Opioids help reduce or eliminate pain. When used for short periods of time, they can help you:  Sleep better.  Do better in physical or occupational therapy.  Feel better in the first few days after an injury.  Recover from surgery. What kind of problems can opioids cause? Opioids can cause side effects, such as:  Constipation.  Nausea.  Vomiting.  Drowsiness.  Confusion.  Opioid use disorder.  Breathing difficulties (respiratory depression). Using opioid pain medicines for longer than 3 days increases your risk of these side effects. Taking opioid pain medicine for a long period of time can affect your ability to do daily tasks. It also  puts you at risk for:  Car accidents.  Heart attack.  Overdose, which can sometimes lead to death. What can increase my risk for developing problems while taking opioids? You may be at an especially high risk for problems while taking opioids if you:  Are over the age of 27.  Are pregnant.  Have kidney or liver disease.  Have certain mental health conditions, such as depression or anxiety.  Have a history of substance use disorder.  Have had an opioid overdose in the past. How do I stop taking opioids if I have been taking them for a long time? If you have been taking opioid medicine for more than a few weeks, you may need to slowly stop taking them (taper). Tapering your use of opioids can decrease your chances of experiencing withdrawal symptoms, such as:  Abdominal pain and cramping.  Nausea.  Sweating.  Sleepiness.  Restlessness.  Uncontrollable shaking (tremors).  Cravings for the medicine. Do not attempt to taper your use of opioids on your own. Talk with your health care provider about how to do this. Your health care provider may prescribe a step-down schedule based on how much medicine you are taking and how long you have been taking it. What are the benefits of stopping the use of opioids? By switching from opioid pain medicine to non-opioid pain management options, you will decrease your risk of accidents and injuries associated with long-term opioid use. You will also be able to:  Monitor your pain more accurately and know when to  seek medical care if it is not improving.  Decrease risk to others around you. Having opioids in the home increases the risk for accidental or intentional use or overdose by others. How can I treat pain without opioids? Pain can be managed with many types of alternative treatments. Ask your health care provider to refer you to one or more specialists who can help you manage pain through:  Physical or occupational  therapy.  Counseling (cognitive-behavioral therapy).  Good nutrition.  Biofeedback.  Massage.  Meditation.  Non-opioid medicine.  Following a gentle exercise program. Where can I get support? If you have been taking opioids for a long time, you may benefit from receiving support for quitting from a local support group or counselor. Ask your health care provider for a referral to these resources in your area. When should I seek medical care? Seek medical care right away if you are taking opioids and you experience any of the following:  Difficulty breathing.  Breathing that is more shallow or slower than normal.  A very slow heartbeat (pulse).  Severe confusion.  Unconsciousness.  Sleepiness.  Difficulty waking from sleep.  Slurred speech.  Nausea and vomiting.  Cold, clammy skin.  Blue lips or fingernails.  Limpness.  Abnormally small pupils. If you think that you or someone else may have taken too much of an opioid medicine, get medical help right away. Do not wait to see if the symptoms go away on their own. Call your local emergency services (911 in the [place] or call the hotline of the [facility] 819 158 2575 in the [place].).  Where can I get more information? To learn more about opioid medicines, visit the Centers for Disease Control and Prevention web site Opioid Basics at https://keller-santana.com/. Summary  Opioid medicines can help you manage moderate-to-severe pain for a short period of time.  Taking opioid pain medicine for a long period of time puts you at risk for unintentional accidents, injury, and even death.  If you think that you or someone else may have taken too much of an opioid, get medical help right away. This information is not intended to replace advice given to you by your health care provider. Make sure you discuss any questions you have with your health care provider. Document Released:  12/26/2015 Document Revised: 07/23/2016 Document Reviewed: 07/11/2015 Elsevier Interactive Patient Education  2017 [employer].

## 2017-08-30 NOTE — Progress Notes (Signed)
Progress Note   Patient ID: CYRUS RAMSBURG, male   DOB: 02/06/1961, 56 y.o.   MRN: [id number]  Chief Complaint  Patient presents with  . Follow-up    Recheck on left knee.    56 years old had a left total knee on March 28. No complaints related to the left knee at this time other than some quadriceps soreness and he says it's hard to kneel on his left knee. He has a more symptomatic right knee with chronic aching dull pain. He's had that for several years. It seems to be weather and activity related.     Review of Systems  Musculoskeletal: Positive for joint pain. Negative for back pain.  Neurological: Negative for tingling.     Physical Exam BP 108/72   Pulse 94   Ht 5\' 10"  (1.778 m)   Wt 248 lb (112.5 kg)   BMI 35.58 kg/m   Gen. appearance the patient's appearance is normal with normal grooming and  hygiene The patient is oriented to person place and time Mood and affect are normal   Ortho Exam  Motor exam 5/5 manual muscle testing , no atrophy  Skin is normal (no rash or erythema)  Left knee incision healed well. Knee comes to full extension. Flexion is 110. Knee is stable in all planes.  Medical decision-making Diagnosis, Data, Plan (risk)  Encounter Diagnoses  Name Primary?  . Status post total left knee replacement   . Aftercare following surgery   . Chronic pain of right knee Yes     Meds ordered this encounter  Medications  . methocarbamol (ROBAXIN) 500 MG tablet    Sig: Take 1 tablet (500 mg total) by mouth every 6 (six) hours as needed for muscle spasms.    Dispense:  56 tablet    Refill:  1  . traMADol-acetaminophen (ULTRACET) 37.5-325 MG tablet    Sig: Take 1 tablet by mouth every 4 (four) hours as needed.    Dispense:  60 tablet    Refill:  5   Recommend annual knee x-ray on March 2019  He will decide on when he wants to have the right total knee done.  Arther Abbott, MD 08/30/2017 2:30 PM

## 2017-09-05 DIAGNOSIS — H35031 Hypertensive retinopathy, right eye: Secondary | ICD-10-CM | POA: Diagnosis not present

## 2017-09-29 DIAGNOSIS — Z1389 Encounter for screening for other disorder: Secondary | ICD-10-CM | POA: Diagnosis not present

## 2017-09-29 DIAGNOSIS — J069 Acute upper respiratory infection, unspecified: Secondary | ICD-10-CM | POA: Diagnosis not present

## 2017-09-29 DIAGNOSIS — Z6841 Body Mass Index (BMI) 40.0 and over, adult: Secondary | ICD-10-CM | POA: Diagnosis not present

## 2017-09-29 DIAGNOSIS — L03115 Cellulitis of right lower limb: Secondary | ICD-10-CM | POA: Diagnosis not present

## 2017-10-07 DIAGNOSIS — Z6841 Body Mass Index (BMI) 40.0 and over, adult: Secondary | ICD-10-CM | POA: Diagnosis not present

## 2017-10-07 DIAGNOSIS — L03115 Cellulitis of right lower limb: Secondary | ICD-10-CM | POA: Diagnosis not present

## 2017-10-07 DIAGNOSIS — J189 Pneumonia, unspecified organism: Secondary | ICD-10-CM | POA: Diagnosis not present

## 2017-11-18 DIAGNOSIS — Z1389 Encounter for screening for other disorder: Secondary | ICD-10-CM | POA: Diagnosis not present

## 2017-11-18 DIAGNOSIS — E119 Type 2 diabetes mellitus without complications: Secondary | ICD-10-CM | POA: Diagnosis not present

## 2017-11-18 DIAGNOSIS — L0390 Cellulitis, unspecified: Secondary | ICD-10-CM | POA: Diagnosis not present

## 2017-11-18 DIAGNOSIS — Z6838 Body mass index (BMI) 38.0-38.9, adult: Secondary | ICD-10-CM | POA: Diagnosis not present

## 2017-11-18 DIAGNOSIS — J069 Acute upper respiratory infection, unspecified: Secondary | ICD-10-CM | POA: Diagnosis not present

## 2017-11-22 ENCOUNTER — Ambulatory Visit (INDEPENDENT_AMBULATORY_CARE_PROVIDER_SITE_OTHER): Payer: Federal, State, Local not specified - PPO

## 2017-11-22 ENCOUNTER — Ambulatory Visit: Payer: Federal, State, Local not specified - PPO

## 2017-11-22 ENCOUNTER — Encounter: Payer: Self-pay | Admitting: Orthopedic Surgery

## 2017-11-22 ENCOUNTER — Ambulatory Visit: Payer: Federal, State, Local not specified - PPO | Admitting: Orthopedic Surgery

## 2017-11-22 VITALS — BP 127/85 | HR 94 | Ht 70.0 in | Wt 258.0 lb

## 2017-11-22 DIAGNOSIS — M25561 Pain in right knee: Secondary | ICD-10-CM

## 2017-11-22 DIAGNOSIS — M1711 Unilateral primary osteoarthritis, right knee: Secondary | ICD-10-CM | POA: Diagnosis not present

## 2017-11-22 DIAGNOSIS — Z96652 Presence of left artificial knee joint: Secondary | ICD-10-CM

## 2017-11-22 NOTE — Progress Notes (Signed)
Progress Note   Patient ID: [name], male   DOB: [birth year], 56 y.o.   MRN: [id number]  Chief Complaint  Patient presents with  . Knee Pain     left TKR 03/09/17    HPI  S/P LEFT TKA (MARCH 2018),  3 WEEKS OF NEW DULL NON RADIATING INTERMITTENT ANTERIOR KNEE PAIN   CHRONIC RIGHT KNEE PAIN H/O OA , PREVIOUSLY PLANNED TKA CANCELLED FOR PHLEBITIS.  Primarily aching medial joint line pain with occasional swelling stiffness and difficulty with weightbearing   Review of Systems  Constitutional: Negative for fever.  Skin: Negative.   Neurological: Negative for tingling.    Past Medical History:  Diagnosis Date  . Arthritis   . CAD (coronary artery disease)    Multivessel disease status post CABG 08/2015  . Essential hypertension   . Hyperlipidemia   . Hypertension   . OSA on CPAP   . Type 2 diabetes mellitus ([facility])     Current Meds  Medication Sig  . aspirin EC 81 MG tablet Take 81 mg by mouth daily.  . diphenhydrAMINE (BENADRYL) 50 MG tablet Take 1 tablet (50 mg total) by mouth at bedtime as needed for itching.  . furosemide (LASIX) 20 MG tablet Take 20 mg daily as needed for weight gain or swelling  . Garlic 8588 MG CAPS Take 1,000 mg by mouth daily.   . insulin lispro protamine-lispro (HUMALOG 75/25 MIX) (75-25) 100 UNIT/ML SUSP injection Inject 70 Units into the skin 2 (two) times daily with a meal.   . KOMBIGLYZE XR 2.04-999 MG TB24 Take 1 tablet by mouth 2 (two) times daily.   [name] loratadine (CLARITIN) 10 MG tablet Take 10 mg by mouth daily.  . meloxicam (MOBIC) 15 MG tablet Take 15 mg by mouth daily.  . metoprolol tartrate (LOPRESSOR) 25 MG tablet TAKE 1/2 TABLET(12.5 MG) BY MOUTH TWICE DAILY  . olmesartan (BENICAR) 40 MG tablet Take 1 tablet (40 mg total) by mouth daily.  [name] omeprazole (PRILOSEC) 40 MG capsule Take 40 mg by mouth daily.   . pravastatin (PRAVACHOL) 20 MG tablet TAKE 1 TABLET(20 MG) BY MOUTH AT BEDTIME  . traMADol-acetaminophen (ULTRACET) 37.5-325  MG tablet Take 1 tablet by mouth every 4 (four) hours as needed.  . TURMERIC CURCUMIN PO Take 1 tablet by mouth daily.     No Known Allergies   BP 127/85   Pulse 94   Ht 5\' 10"  (1.778 m)   Wt 258 lb (117 kg)   BMI 37.02 kg/m   Physical Exam  Constitutional: He is oriented to person, place, and time. He appears well-developed and well-nourished.  Vital signs have been reviewed and are stable. Gen. appearance the patient is well-developed and well-nourished with normal grooming and hygiene.   Cardiovascular:  Bilateral peripheral edema mild, mild skin discoloration bilateral chronic peripheral vascular disease    Musculoskeletal:       Left knee: He exhibits no swelling, no effusion, no ecchymosis, no deformity, no laceration, no erythema, normal alignment, no LCL laxity and no MCL laxity. Tenderness found.       Legs: GAIT IS NORMAL   Neurological: He is alert and oriented to person, place, and time.  Skin: Skin is warm and dry. No erythema.  Psychiatric: He has a normal mood and affect.  Vitals reviewed.    Medical decision-making Encounter Diagnoses  Name Primary?  . S/P total knee replacement, left 03/09/17   . Right knee pain, unspecified chronicity   . Primary  osteoarthritis of right knee Yes    Plain films are ordered of both knees  The last x-ray on the left knee showed a stable prosthesis with no complicating features.  This was done on February 27, 2017  The last x-ray of the right knee was July 2017 it showed severe varus alignment joint space narrowing osteophyte formation subchondral sclerosis  Today's x-rays left knee stable prosthesis no complications  Right knee severe arthritis medial compartment  See both reports but summation's are noted above.  Plan TIGER BALM LEFT KNEE AND KNEE EXERCISES   RIGHT KNEE: Injection Procedure note right knee injection verbal consent was obtained to inject right knee joint  Timeout was completed to confirm the site  of injection  The medications used were 40 mg of Depo-Medrol and 1% lidocaine 3 cc  Anesthesia was provided by ethyl chloride and the skin was prepped with alcohol.  After cleaning the skin with alcohol a 20-gauge needle was used to inject the right knee joint. There were no complications. A sterile bandage was applied.   Mr. Grundman will call us when he is ready to schedule surgery total knee right knee     Arther Abbott, MD 11/22/2017 11:41 AM

## 2017-11-22 NOTE — Patient Instructions (Signed)
Exercises for the next 6 weeks as instructed on the sheet  Tiger balm on the left knee

## 2017-11-23 NOTE — Progress Notes (Signed)
Cardiology Office Note  Date: 11/24/2017   ID: [name], DOB 1961-02-09, MRN [id number]  PCP: [name], MD  Primary Cardiologist: Rozann Lesches, MD   Chief Complaint  Patient presents with  . Coronary Artery Disease    History of Present Illness: [name] is a 56 y.o. male last seen in June.  He presents for a routine follow-up visit.  He does not report any progressive angina symptoms or increasing shortness of breath with typical activities.  He has had no palpitations or syncope.  At the previous visit he was taken off HCTZ with relatively low blood pressure.  He still uses Lasix for dependent lower leg edema.  Otherwise cardiac regimen includes aspirin, Lopressor, Benicar, and Pravachol.  He reports no intolerances.  Continues to follow with endocrinology as well for management of diabetes mellitus.  Recent follow-up echocardiogram is outlined below, LVEF 55-60% range.  We discussed these results again today.  Past Medical History:  Diagnosis Date  . Arthritis   . CAD (coronary artery disease)    Multivessel disease status post CABG 08/2015  . Essential hypertension   . Hyperlipidemia   . Hypertension   . OSA on CPAP   . Type 2 diabetes mellitus ([place])     Past Surgical History:  Procedure Laterality Date  . APPENDECTOMY    . Biceps tendon surgery Right   . CARDIAC CATHETERIZATION N/A 08/25/2015   Procedure: Left Heart Cath and Coronary Angiography;  Surgeon: Belva Crome, MD;  Location: [place] [facility];  Service: Cardiovascular;  Laterality: N/A;  . CORONARY ARTERY BYPASS GRAFT N/A 08/29/2015   Procedure: CORONARY ARTERY BYPASS GRAFTING (CABG);  Surgeon: [name], MD;  Location: [place];  Service: Open Heart Surgery;  Laterality: N/A;  . KNEE ARTHROSCOPY Left   . TEE WITHOUT CARDIOVERSION N/A 08/29/2015   Procedure: TRANSESOPHAGEAL ECHOCARDIOGRAM (TEE);  Surgeon: [name], MD;  Location: [place];  Service: Open Heart  Surgery;  Laterality: N/A;  . TOTAL KNEE ARTHROPLASTY Left 03/09/2017   Procedure: TOTAL KNEE ARTHROPLASTY;  Surgeon: Carole Civil, MD;  Location: [facility];  Service: Orthopedics;  Laterality: Left;    Current Outpatient Medications  Medication Sig Dispense Refill  . aspirin EC 81 MG tablet Take 81 mg by mouth daily.    . diphenhydrAMINE (BENADRYL) 50 MG tablet Take 1 tablet (50 mg total) by mouth at bedtime as needed for itching. 30 tablet 0  . furosemide (LASIX) 20 MG tablet Take 20 mg daily as needed for weight gain or swelling 90 tablet 3  . Garlic 9735 MG CAPS Take 1,000 mg by mouth daily.     . insulin lispro protamine-lispro (HUMALOG 75/25 MIX) (75-25) 100 UNIT/ML SUSP injection Inject 70 Units into the skin 2 (two) times daily with a meal.     . KOMBIGLYZE XR 2.04-999 MG TB24 Take 1 tablet by mouth 2 (two) times daily.     [name] loratadine (CLARITIN) 10 MG tablet Take 10 mg by mouth daily.    . meloxicam (MOBIC) 15 MG tablet Take 15 mg by mouth daily.    . metoprolol tartrate (LOPRESSOR) 25 MG tablet TAKE 1/2 TABLET(12.5 MG) BY MOUTH TWICE DAILY 90 tablet 3  . olmesartan (BENICAR) 40 MG tablet Take 1 tablet (40 mg total) by mouth daily. 90 tablet 3  . omeprazole (PRILOSEC) 40 MG capsule Take 40 mg by mouth daily.     . pravastatin (PRAVACHOL) 20 MG tablet TAKE 1  TABLET(20 MG) BY MOUTH AT BEDTIME 90 tablet 3  . traMADol-acetaminophen (ULTRACET) 37.5-325 MG tablet Take 1 tablet by mouth every 4 (four) hours as needed. 60 tablet 5  . TURMERIC CURCUMIN PO Take 1 tablet by mouth daily.      No current facility-administered medications for this visit.    Allergies:  Patient has no known allergies.   Social History: The patient  reports that  has never smoked. he has never used smokeless tobacco. He reports that he does not drink alcohol or use drugs.   ROS:  Please see the history of present illness. Otherwise, complete review of systems is positive for right knee arthritic pain.  All  other systems are reviewed and negative.   Physical Exam: VS:  BP 136/82   Pulse 84   Ht 5\' 10"  (1.778 m)   Wt 260 lb (117.9 kg)   SpO2 97%   BMI 37.31 kg/m , BMI Body mass index is 37.31 kg/m.  Wt Readings from Last 3 Encounters:  11/24/17 260 lb (117.9 kg)  11/22/17 258 lb (117 kg)  08/30/17 248 lb (112.5 kg)    General: Morbidly obese male, appears comfortable at rest. HEENT: Conjunctiva and lids normal, oropharynx clear. Neck: Supple, no elevated JVP or carotid bruits, no thyromegaly. Lungs: Clear to auscultation, nonlabored breathing at rest. Cardiac: Regular rate and rhythm, no S3 or significant systolic murmur, no pericardial rub. Abdomen: Soft, nontender, bowel sounds present, no guarding or rebound. Extremities: Mild lower leg edema, distal pulses 2+. Skin: Warm and dry. Musculoskeletal: No kyphosis. Neuropsychiatric: Alert and oriented x3, affect grossly appropriate.  ECG: I personally reviewed the tracing from 05/24/2017 which showed sinus rhythm with decreased R wave progression and low voltage, nonspecific ST-T changes.  Recent Labwork: 03/10/2017: BUN 14; Creatinine, Ser 1.19; Potassium 3.8; Sodium 131 03/12/2017: Hemoglobin 11.3; Platelets 132     Component Value Date/Time   CHOL 166 12/18/2015 0819   TRIG 205 (H) 12/18/2015 0819   HDL 35 (L) 12/18/2015 0819   CHOLHDL 4.7 12/18/2015 0819   VLDL 41 (H) 12/18/2015 0819   LDLCALC 90 12/18/2015 0819    Other Studies Reviewed Today:  Echocardiogram 05/26/2017: Study Conclusions  - Procedure narrative: Transthoracic echocardiography. Image   quality was adequate. Intravenous contrast (Definity) was   administered. - Left ventricle: The cavity size was normal. Wall thickness was   increased in a pattern of mild LVH. Systolic function was normal.   The estimated ejection fraction was in the range of 55% to 60%.   Wall motion was normal; there were no regional wall motion   abnormalities. - Mitral valve:  There was mild regurgitation. - Right ventricle: Systolic function was reduced. - Atrial septum: No defect or patent foramen ovale was identified.  Assessment and Plan:  1.  Multivessel CAD status post CABG in 2016.  He reports no angina and is doing well on current medical regimen.  No changes were made today.  2.  History of cardiomyopathy with LVEF 45-50%, now normalized in the range of 55-60% by echocardiogram in June.  He continues to use low-dose Lasix for mild lower leg dependent edema.  3.  Essential hypertension, blood pressure control is adequate today.  No other changes were made in regimen.  4.  Hyperlipidemia, continues on Pravachol.  He follows with Dr. Hilma Favors.  Current medicines were reviewed with the patient today.  Disposition: Follow-up in 6 months.  Signed, Satira Sark, MD, [facility] 11/24/2017 8:52 AM  [facility] [address], [place], [place] [address] Phone: [phone]; Fax: [phone]

## 2017-11-24 ENCOUNTER — Ambulatory Visit: Payer: Federal, State, Local not specified - PPO | Admitting: Cardiology

## 2017-11-24 ENCOUNTER — Encounter: Payer: Self-pay | Admitting: Cardiology

## 2017-11-24 VITALS — BP 136/82 | HR 84 | Ht 70.0 in | Wt 260.0 lb

## 2017-11-24 DIAGNOSIS — I25119 Atherosclerotic heart disease of native coronary artery with unspecified angina pectoris: Secondary | ICD-10-CM | POA: Diagnosis not present

## 2017-11-24 DIAGNOSIS — Z8679 Personal history of other diseases of the circulatory system: Secondary | ICD-10-CM

## 2017-11-24 DIAGNOSIS — I10 Essential (primary) hypertension: Secondary | ICD-10-CM | POA: Diagnosis not present

## 2017-11-24 DIAGNOSIS — E782 Mixed hyperlipidemia: Secondary | ICD-10-CM | POA: Diagnosis not present

## 2017-11-24 NOTE — Patient Instructions (Signed)
Your physician wants you to follow-up in:  6 months with Dr McDowell You will receive a reminder letter in the mail two months in advance. If you don't receive a letter, please call our office to schedule the follow-up appointment.    Your physician recommends that you continue on your current medications as directed. Please refer to the Current Medication list given to you today.     If you need a refill on your cardiac medications before your next appointment, please call your pharmacy.     No lab work or testing ordered today.      Thank you for choosing [facility] !        

## 2017-12-04 ENCOUNTER — Other Ambulatory Visit: Payer: Self-pay | Admitting: Orthopedic Surgery

## 2017-12-04 DIAGNOSIS — Z4889 Encounter for other specified surgical aftercare: Secondary | ICD-10-CM

## 2017-12-04 DIAGNOSIS — Z96652 Presence of left artificial knee joint: Secondary | ICD-10-CM

## 2017-12-21 ENCOUNTER — Other Ambulatory Visit: Payer: Self-pay | Admitting: Adult Health

## 2017-12-22 DIAGNOSIS — J302 Other seasonal allergic rhinitis: Secondary | ICD-10-CM | POA: Diagnosis not present

## 2017-12-22 DIAGNOSIS — E1129 Type 2 diabetes mellitus with other diabetic kidney complication: Secondary | ICD-10-CM | POA: Diagnosis not present

## 2017-12-22 DIAGNOSIS — Z6838 Body mass index (BMI) 38.0-38.9, adult: Secondary | ICD-10-CM | POA: Diagnosis not present

## 2017-12-22 DIAGNOSIS — Z1389 Encounter for screening for other disorder: Secondary | ICD-10-CM | POA: Diagnosis not present

## 2017-12-22 DIAGNOSIS — A630 Anogenital (venereal) warts: Secondary | ICD-10-CM | POA: Diagnosis not present

## 2017-12-28 DIAGNOSIS — I10 Essential (primary) hypertension: Secondary | ICD-10-CM | POA: Diagnosis not present

## 2017-12-28 DIAGNOSIS — E1165 Type 2 diabetes mellitus with hyperglycemia: Secondary | ICD-10-CM | POA: Diagnosis not present

## 2017-12-28 DIAGNOSIS — N189 Chronic kidney disease, unspecified: Secondary | ICD-10-CM | POA: Diagnosis not present

## 2017-12-28 DIAGNOSIS — E7800 Pure hypercholesterolemia, unspecified: Secondary | ICD-10-CM | POA: Diagnosis not present

## 2018-01-15 ENCOUNTER — Other Ambulatory Visit: Payer: Self-pay | Admitting: Cardiology

## 2018-01-20 DIAGNOSIS — I10 Essential (primary) hypertension: Secondary | ICD-10-CM | POA: Diagnosis not present

## 2018-01-20 DIAGNOSIS — E1129 Type 2 diabetes mellitus with other diabetic kidney complication: Secondary | ICD-10-CM | POA: Diagnosis not present

## 2018-01-20 DIAGNOSIS — N182 Chronic kidney disease, stage 2 (mild): Secondary | ICD-10-CM | POA: Diagnosis not present

## 2018-01-20 DIAGNOSIS — M25511 Pain in right shoulder: Secondary | ICD-10-CM | POA: Diagnosis not present

## 2018-01-20 DIAGNOSIS — M1990 Unspecified osteoarthritis, unspecified site: Secondary | ICD-10-CM | POA: Diagnosis not present

## 2018-01-20 DIAGNOSIS — M170 Bilateral primary osteoarthritis of knee: Secondary | ICD-10-CM | POA: Diagnosis not present

## 2018-01-20 DIAGNOSIS — E782 Mixed hyperlipidemia: Secondary | ICD-10-CM | POA: Diagnosis not present

## 2018-01-20 DIAGNOSIS — I25118 Atherosclerotic heart disease of native coronary artery with other forms of angina pectoris: Secondary | ICD-10-CM | POA: Diagnosis not present

## 2018-01-20 DIAGNOSIS — I5022 Chronic systolic (congestive) heart failure: Secondary | ICD-10-CM | POA: Diagnosis not present

## 2018-01-20 DIAGNOSIS — Z1389 Encounter for screening for other disorder: Secondary | ICD-10-CM | POA: Diagnosis not present

## 2018-01-20 DIAGNOSIS — Z6841 Body Mass Index (BMI) 40.0 and over, adult: Secondary | ICD-10-CM | POA: Diagnosis not present

## 2018-01-26 DIAGNOSIS — J111 Influenza due to unidentified influenza virus with other respiratory manifestations: Secondary | ICD-10-CM | POA: Diagnosis not present

## 2018-01-26 DIAGNOSIS — J22 Unspecified acute lower respiratory infection: Secondary | ICD-10-CM | POA: Diagnosis not present

## 2018-01-26 DIAGNOSIS — Z1389 Encounter for screening for other disorder: Secondary | ICD-10-CM | POA: Diagnosis not present

## 2018-01-26 DIAGNOSIS — Z6841 Body Mass Index (BMI) 40.0 and over, adult: Secondary | ICD-10-CM | POA: Diagnosis not present

## 2018-02-11 ENCOUNTER — Other Ambulatory Visit: Payer: Self-pay | Admitting: Orthopedic Surgery

## 2018-02-11 DIAGNOSIS — Z4889 Encounter for other specified surgical aftercare: Secondary | ICD-10-CM

## 2018-02-11 DIAGNOSIS — Z96652 Presence of left artificial knee joint: Secondary | ICD-10-CM

## 2018-02-15 DIAGNOSIS — G4733 Obstructive sleep apnea (adult) (pediatric): Secondary | ICD-10-CM | POA: Diagnosis not present

## 2018-02-17 ENCOUNTER — Encounter: Payer: Self-pay | Admitting: Orthopedic Surgery

## 2018-02-17 ENCOUNTER — Ambulatory Visit (INDEPENDENT_AMBULATORY_CARE_PROVIDER_SITE_OTHER): Payer: Federal, State, Local not specified - PPO

## 2018-02-17 ENCOUNTER — Ambulatory Visit: Payer: Federal, State, Local not specified - PPO | Admitting: Orthopedic Surgery

## 2018-02-17 VITALS — BP 117/74 | HR 91 | Ht 70.0 in | Wt 271.0 lb

## 2018-02-17 DIAGNOSIS — M25512 Pain in left shoulder: Secondary | ICD-10-CM | POA: Diagnosis not present

## 2018-02-17 DIAGNOSIS — M7542 Impingement syndrome of left shoulder: Secondary | ICD-10-CM

## 2018-02-17 NOTE — Progress Notes (Signed)
Patient ID: [name], male   DOB: [birth year], 59 y.o.   MRN: [id number]  Chief Complaint  Patient presents with  . Shoulder Pain    left shoulder pain x 4-5 weeks no recent injury old dislocation 1981    HPI [name] is a 57 y.o. male.  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  Musculoskeletal: Negative for neck pain.  Neurological: Negative for tingling and sensory change.    Past Medical History:  Diagnosis Date  . Arthritis   . CAD (coronary artery disease)    Multivessel disease status post CABG 08/2015  . Essential hypertension   . Hyperlipidemia   . Hypertension   . OSA on CPAP   . Type 2 diabetes mellitus ([place])     Past Surgical History:  Procedure Laterality Date  . APPENDECTOMY    . Biceps tendon surgery Right   . CARDIAC CATHETERIZATION N/A 08/25/2015   Procedure: Left Heart Cath and Coronary Angiography;  Surgeon: Belva Crome, MD;  Location: [place] [facility];  Service: Cardiovascular;  Laterality: N/A;  . CORONARY ARTERY BYPASS GRAFT N/A 08/29/2015   Procedure: CORONARY ARTERY BYPASS GRAFTING (CABG);  Surgeon: [name], MD;  Location: [place];  Service: Open Heart Surgery;  Laterality: N/A;  . KNEE ARTHROSCOPY Left   . TEE WITHOUT CARDIOVERSION N/A 08/29/2015   Procedure: TRANSESOPHAGEAL ECHOCARDIOGRAM (TEE);  Surgeon: [name], MD;  Location: [place];  Service: Open Heart Surgery;  Laterality: N/A;  . TOTAL KNEE ARTHROPLASTY Left 03/09/2017   Procedure: TOTAL KNEE ARTHROPLASTY;  Surgeon: Carole Civil, MD;  Location: [facility];  Service: Orthopedics;  Laterality: Left;    Family History  Problem  Relation Age of Onset  . Arthritis Unknown   . Cancer Unknown   . Diabetes Unknown   . CAD Father   . Diabetes Mellitus II Father   . CAD Brother   . Diabetes Mellitus II Brother   . Anesthesia problems Neg Hx   . Hypotension Neg Hx   . Malignant hyperthermia Neg Hx   . Pseudochol deficiency Neg Hx      Social History   Tobacco Use  . Smoking status: Never Smoker  . Smokeless tobacco: Never Used  Substance Use Topics  . Alcohol use: No    Alcohol/week: 0.0 oz  . Drug use: No    No Known Allergies    Current Meds  Medication Sig  . aspirin EC 81 MG tablet Take 81 mg by mouth daily.  . furosemide (LASIX) 20 MG tablet Take 20 mg daily as needed for weight gain or swelling  . Garlic 2751 MG CAPS Take 1,000 mg by mouth daily.   . hydrochlorothiazide (HYDRODIURIL) 25 MG tablet TAKE 1/2 TABLET PO DAILY  . HYDROcodone-acetaminophen (NORCO/VICODIN) 5-325 MG tablet Take 1 tablet by mouth every 6 (six) hours as needed for moderate pain.  [name] insulin aspart (NOVOLOG) 100 UNIT/ML injection Inject into the skin 3 (three) times daily before meals.  [name] KOMBIGLYZE XR 2.04-999 MG TB24 Take 1 tablet by mouth 2 (two) times daily.   [name] loratadine (CLARITIN) 10 MG tablet Take 10 mg by mouth  daily.  . meloxicam (MOBIC) 15 MG tablet Take 15 mg by mouth daily.  [name] omeprazole (PRILOSEC) 40 MG capsule Take 40 mg by mouth daily.   . pantoprazole (PROTONIX) 40 MG tablet Take 40 mg by mouth daily.  . pravastatin (PRAVACHOL) 20 MG tablet TAKE 1 TABLET(20 MG) BY MOUTH AT BEDTIME  . TRESIBA FLEXTOUCH 200 UNIT/ML SOPN   . TURMERIC CURCUMIN PO Take 1 tablet by mouth daily.        Physical Exam BP 117/74   Pulse 91   Ht 5\' 10"  (1.778 m)   Wt 271 lb (122.9 kg)   BMI 38.88 kg/m  Physical Exam  Constitutional: He is oriented to person, place, and time. He appears well-developed and well-nourished.  Vital signs have been reviewed and are stable. Gen. appearance the patient is well-developed and  well-nourished with normal grooming and hygiene.   Neurological: He is alert and oriented to person, place, and time. Gait normal.  Skin: Skin is warm and dry. No erythema.  Psychiatric: He has a normal mood and affect.  Vitals reviewed.  Ambulatory status normal with no assistive devices Right Shoulder Exam   Tenderness  The patient is experiencing tenderness in the acromion.  Range of Motion  Forward flexion: abnormal  Internal rotation 0 degrees: abnormal   Muscle Strength  The patient has normal right shoulder strength. Abduction: 4/5  Supraspinatus: 4/5   Tests  Apprehension: negative Cross arm: negative Impingement: positive Drop arm: negative  Other  Erythema: absent Sensation: normal Pulse: present   Left Shoulder Exam   Tenderness  The patient is experiencing tenderness in the acromion.  Range of Motion  Forward flexion: abnormal  Internal rotation 0 degrees: abnormal   Muscle Strength  The patient has normal left shoulder strength. Abduction: 4/5  Supraspinatus: 4/5   Tests  Apprehension: negative Cross arm: negative Impingement: positive Drop arm: negative  Other  Erythema: absent Sensation: normal Pulse: present      Painful arc was between 90 and 120 degrees no external rotation lag liftoff test normal belly press normal   MEDICAL DECISION SECTION  xrays ordered?  Yes  My independent reading of xrays: See dictated report normal shoulder glenohumeral joint and acromioclavicular joint   Encounter Diagnoses  Name Primary?  . Acute pain of left shoulder Yes  . Impingement syndrome of left shoulder region      PLAN:   No orders of the defined types were placed in this encounter.  Injection?  Yes MRI/CT/?  No  Procedure note the subacromial injection shoulder left   Verbal consent was obtained to inject the  Left   Shoulder  Timeout was completed to confirm the injection site is a subacromial space of the  left   shoulder  Medication used Depo-Medrol 40 mg and lidocaine 1% 3 cc  Anesthesia was provided by ethyl chloride  The injection was performed in the left  posterior subacromial space. After pinning the skin with alcohol and anesthetized the skin with ethyl chloride the subacromial space was injected using a 20-gauge needle. There were no complications  Sterile dressing was applied.  Plan for him is this times to take meloxicam and Tylenol for pain use 1 of the muscle creams as needed for other pain relief if needed home exercise program stretching and strengthening repeat exam 6 weeks

## 2018-02-17 NOTE — Patient Instructions (Addendum)
Take meloxicam and tylenol for pain and use one of the muscle creams as needed   These are the muscle and arthrits creams I recommend:  PLEASE READ THE PACKAGE INSTRUCTIONS BEFORE USING   Ben Gay arthritis cream  Icy hot vanishing gel  Aspercreme odor free  Myoflex Oderless pain reliever  Capzasin  Sportscreme  Max freeze    Shoulder Range of Motion Exercises Shoulder range of motion (ROM) exercises are designed to keep the shoulder moving freely. They are often recommended for people who have shoulder pain. Phase 1 exercises When you are able, do this exercise 5-6 days per week, or as told by your health care provider. Work toward doing 2 sets of 10 swings. Pendulum Exercise How To Do This Exercise Lying Down 1. Lie face-down on a bed with your abdomen close to the side of the bed. 2. Let your arm hang over the side of the bed. 3. Relax your shoulder, arm, and hand. 4. Slowly and gently swing your arm forward and back. Do not use your neck muscles to swing your arm. They should be relaxed. If you are struggling to swing your arm, have someone gently swing it for you. When you do this exercise for the first time, swing your arm at a 15 degree angle for 15 seconds, or swing your arm 10 times. As pain lessens over time, increase the angle of the swing to 30-45 degrees. 5. Repeat steps 1-4 with the other arm.  How To Do This Exercise While Standing 1. Stand next to a sturdy chair or table and hold on to it with your hand. 1. Bend forward at the waist. 2. Bend your knees slightly. 3. Relax your other arm and let it hang limp. 4. Relax the shoulder blade of the arm that is hanging and let it drop. 5. While keeping your shoulder relaxed, use body motion to swing your arm in small circles. The first time you do this exercise, swing your arm for about 30 seconds or 10 times. When you do it next time, swing your arm for a little longer. 6. Stand up tall and relax. 7. Repeat steps  1-7, this time changing the direction of the circles. 2. Repeat steps 1-8 with the other arm.  Phase 2 exercises Do these exercises 3-4 times per day on 5-6 days per week or as told by your health care provider. Work toward holding the stretch for 20 seconds. Stretching Exercise 1 1. Lift your arm straight out in front of you. 2. Bend your arm 90 degrees at the elbow (right angle) so your forearm goes across your body and looks like the letter "L." 3. Use your other arm to gently pull the elbow forward and across your body. 4. Repeat steps 1-3 with the other arm. Stretching Exercise 2 You will need a towel or rope for this exercise. 1. Bend one arm behind your back with the palm facing outward. 2. Hold a towel with your other hand. 3. Reach the arm that holds the towel above your head, and bend that arm at the elbow. Your wrist should be behind your neck. 4. Use your free hand to grab the free end of the towel. 5. With the higher hand, gently pull the towel up behind you. 6. With the lower hand, pull the towel down behind you. 7. Repeat steps 1-6 with the other arm.  Phase 3 exercises Do each of these exercises at four different times of day (sessions) every day or  as told by your health care provider. To begin with, repeat each exercise 5 times (repetitions). Work toward doing 3 sets of 12 repetitions or as told by your health care provider. Strengthening Exercise 1 You will need a light weight for this activity. As you grow stronger, you may use a heavier weight. 1. Standing with a weight in your hand, lift your arm straight out to the side until it is at the same height as your shoulder. 2. Bend your arm at 90 degrees so that your fingers are pointing to the ceiling. 3. Slowly raise your hand until your arm is straight up in the air. 4. Repeat steps 1-3 with the other arm.  Strengthening Exercise 2 You will need a light weight for this activity. As you grow stronger, you may use a  heavier weight. 1. Standing with a weight in your hand, gradually move your straight arm in an arc, starting at your side, then out in front of you, then straight up over your head. 2. Gradually move your other arm in an arc, starting at your side, then out in front of you, then straight up over your head. 3. Repeat steps 1-2 with the other arm.  Strengthening Exercise 3 You will need an elastic band for this activity. As you grow stronger, gradually increase the size of the bands or increase the number of bands that you use at one time. 1. While standing, hold an elastic band in one hand and raise that arm up in the air. 2. With your other hand, pull down the band until that hand is by your side. 3. Repeat steps 1-2 with the other arm.  This information is not intended to replace advice given to you by your health care provider. Make sure you discuss any questions you have with your health care provider. Document Released: 08/28/2003 Document Revised: 07/25/2016 Document Reviewed: 11/25/2014 Elsevier Interactive Patient Education  [name].

## 2018-02-28 ENCOUNTER — Ambulatory Visit (HOSPITAL_COMMUNITY)
Admission: RE | Admit: 2018-02-28 | Discharge: 2018-02-28 | Disposition: A | Discharge status: Home / Self Care | Payer: Federal, State, Local not specified - PPO | Source: Ambulatory Visit | Attending: Physician Assistant | Admitting: Physician Assistant

## 2018-02-28 ENCOUNTER — Other Ambulatory Visit (HOSPITAL_COMMUNITY): Payer: Self-pay | Admitting: Physician Assistant

## 2018-02-28 DIAGNOSIS — K080 Exfoliation of teeth due to systemic causes: Secondary | ICD-10-CM | POA: Diagnosis not present

## 2018-02-28 DIAGNOSIS — Z0001 Encounter for general adult medical examination with abnormal findings: Secondary | ICD-10-CM | POA: Diagnosis not present

## 2018-02-28 DIAGNOSIS — Z6841 Body Mass Index (BMI) 40.0 and over, adult: Secondary | ICD-10-CM | POA: Diagnosis not present

## 2018-02-28 DIAGNOSIS — R0602 Shortness of breath: Secondary | ICD-10-CM

## 2018-02-28 DIAGNOSIS — Z1389 Encounter for screening for other disorder: Secondary | ICD-10-CM | POA: Diagnosis not present

## 2018-03-01 ENCOUNTER — Encounter: Payer: Self-pay | Admitting: Neurology

## 2018-03-06 ENCOUNTER — Ambulatory Visit: Payer: Federal, State, Local not specified - PPO | Admitting: Neurology

## 2018-03-06 ENCOUNTER — Encounter: Payer: Self-pay | Admitting: Neurology

## 2018-03-06 VITALS — BP 124/74 | HR 85 | Ht 70.0 in | Wt 274.0 lb

## 2018-03-06 DIAGNOSIS — Z9989 Dependence on other enabling machines and devices: Secondary | ICD-10-CM | POA: Diagnosis not present

## 2018-03-06 DIAGNOSIS — Z6838 Body mass index (BMI) 38.0-38.9, adult: Secondary | ICD-10-CM

## 2018-03-06 DIAGNOSIS — G4733 Obstructive sleep apnea (adult) (pediatric): Secondary | ICD-10-CM | POA: Diagnosis not present

## 2018-03-06 DIAGNOSIS — E669 Obesity, unspecified: Secondary | ICD-10-CM | POA: Diagnosis not present

## 2018-03-06 NOTE — Progress Notes (Signed)
SLEEP MEDICINE CLINIC   Provider:  Larey Seat, M D  Referring Provider: [name], MD Primary Care Physician:  [name], MD  Chief Complaint  Patient presents with  . Follow-up    pt alone, rm 10. pt states CPAP is working well.  DME [facility]    HPI:  [name] is a 57 y.o. male , seen here on 03-03-2015 as a referral from [name] for a sleep evaluation.   [name] states that he is here because his physician and his spouse are concerned about his sleep apnea. About 7 weeks ago he had presented with chest tightness and was told that he had significant coronary artery disease. Some of his vessels were 95% occluded. He underwent bypass surgery on 08-29-15, and it was in the hospital that he was observed having apnea by his nurses as well as by his spouse. I would like to allude that the patient has other risk factors such as diabetes, hypertension, hypercholesterolemia and that he used to be morbidly obese but lost about 80 pounds within the last 12 months. The patient acknowledges that he feels fatigued and daytime sleepy also this varies day by day. Often his sleep is not refreshing nonrestorative. His wife has noticed him to snore as well as witnessed apneas. Sleep habits are as follows:The patient retreats usually between 9 and 10 at night to the bedroom, he is promptly asleep. The bedroom is described as core, quiet and dark. He shares the bedroom in bed with his wife. His wife has not mentioned any restlessness kicking or thrashing. He prefers to sleep on his side and states that he cannot sleep on his back. He usually wakes up every 2 hours or so goes to the bathroom. 2-3 times at night. He goes promptly to sleep again. He rises in the morning at 6 AM and wakes spontaneously without alarm. He has not noticed a dry mouth in the morning, he may sometimes have a morning headache. He reports no numbness, no jaw pain. He has never been woken up by headaches from  sleep. He reports that he dreams also his dreams are not acted out upon, nor are extremely vivid or Water quality scientist. When he wakes up he is often on his back and not on his side. Sleep medical history and family sleep history: [name] father suffered from coronary artery disease and died of multiorgan failure. [name] younger brother also has undergone bypass surgery for coronary artery disease. His brother also has been diagnosed with obstructive sleep apnea. Social history: married, disabled due to heart disease. He does not use tobacco products and does not drink alcohol, he drinks mainly water and has 1 or 2 coffees in the morning. No soda or iced tea.   Interval history from 03/02/2016  We are meeting today to follow-up on a recent sleep study;  the patient underwent polysomnography on 11/30/2015 and was diagnosed with mild to moderate apnea at an AHI of 16.5, REM AHI of 30.7, supine AHI of 90.6. He also had the oxygen nadir as low as 79% saturation was 30 minutes of desaturation time.  For this reason I suggested strongly to use CPAP rather than a dental device. The CPAP titration followed on 12/30/2015 and reduced the AHI to 1.3 the patient did best at 9 cm water pressure was 17 m EPR I recommended an air-fit P 10 in medium size with heated humidity. The patient's compliance download today shows 100% compliance for  the last 30 days 97% compliance for use over 4 hours average user time is 7 hours 31 minutes and the patient has an AHI of 0.6 at 9 cm water pressure. This is an almost complete alleviation there is no significant air leak and he has been using the same unit humidity settings for the last month. He endorsed today the Epworth sleepiness score at 4 points and the fatigue severity score at 17 points.  Interval history from 03/02/2017, the patient continues to use CPAP at 100% compliance with an average of 6 hours and 38 minutes of nightly use, with a residual AHI of  0.4 at a setting of 9 cm water pressure. 100% compliant by time and number of days -his machine should be WiFi compatible more actual data should be obtained.  He assured me that he much more alert, able to multitask, concentrated and that his Epworth sleepiness score is 3 points, his fatigue severity score is 12 points. He lost 30 pounds since last year, he reported.   Interval history from 06 March 2018, I have the pleasure of seeing [name] today for his yearly follow-up.  His DME is [facility], he has been followed in this practice since 2016.  He is a very compliant CPAP user but this last 30-day download had a couple of gap days.  On 5 days he did not use his device he had a power outage.  Overall his compliance is still very good at 85%, average AHI is 0.7.  CPAP is set at 9 cm water pressure.  His ramp starts over 10 minutes at 5 cmH2O and he uses a 1 cm C-Flex setting.  Humidifier is set at level 2, the patient still endorses a moderate degree of fatigue and a SSS of 39, daytime sleepiness however is not present the Epworth sleepiness score was endorsed at 6 points. His sleep habits have been impaired by a shoulder injury- he cannot sleep on his preferred left side, neither on his back.  He had surgery for rotator cuff injury. No problems with anesthesia and waking up was uncomplicated.       Review of Systems: Out of a complete 14 system review, the patient complains of only the following symptoms, and all other reviewed systems are negative.  Left knee replaced on March 28 of 2018, right knee pending.  Epworth score is 6, FSS 39 points, no nocturia.    Social History   Socioeconomic History  . Marital status: Married    Spouse name: Not on file  . Number of children: Not on file  . Years of education: college  . Highest education level: Not on file  Occupational History  . Occupation: Magazine features editor: [employer]  Social Needs  . Financial resource  strain: Not on file  . Food insecurity:    Worry: Not on file    Inability: Not on file  . Transportation needs:    Medical: Not on file    Non-medical: Not on file  Tobacco Use  . Smoking status: Never Smoker  . Smokeless tobacco: Never Used  Substance and Sexual Activity  . Alcohol use: No    Alcohol/week: 0.0 oz  . Drug use: No  . Sexual activity: Yes    Birth control/protection: None  Lifestyle  . Physical activity:    Days per week: Not on file    Minutes per session: Not on file  . Stress: Not on file  Relationships  .  Social connections:    Talks on phone: Not on file    Gets together: Not on file    Attends religious service: Not on file    Active member of club or organization: Not on file    Attends meetings of clubs or organizations: Not on file    Relationship status: Not on file  . Intimate partner violence:    Fear of current or ex partner: Not on file    Emotionally abused: Not on file    Physically abused: Not on file    Forced sexual activity: Not on file  Other Topics Concern  . Not on file  Social History Narrative  . Not on file    Family History  Problem Relation Age of Onset  . Arthritis Unknown   . Cancer Unknown   . Diabetes Unknown   . CAD Father   . Diabetes Mellitus II Father   . CAD Brother   . Diabetes Mellitus II Brother   . Anesthesia problems Neg Hx   . Hypotension Neg Hx   . Malignant hyperthermia Neg Hx   . Pseudochol deficiency Neg Hx     Past Medical History:  Diagnosis Date  . Arthritis   . CAD (coronary artery disease)    Multivessel disease status post CABG 08/2015  . Essential hypertension   . Hyperlipidemia   . Hypertension   . OSA on CPAP   . Type 2 diabetes mellitus ([facility])     Past Surgical History:  Procedure Laterality Date  . APPENDECTOMY    . Biceps tendon surgery Right   . CARDIAC CATHETERIZATION N/A 08/25/2015   Procedure: Left Heart Cath and Coronary Angiography;  Surgeon: Belva Crome, MD;   Location: [facility];  Service: Cardiovascular;  Laterality: N/A;  . CORONARY ARTERY BYPASS GRAFT N/A 08/29/2015   Procedure: CORONARY ARTERY BYPASS GRAFTING (CABG);  Surgeon: [name], MD;  Location: [facility];  Service: Open Heart Surgery;  Laterality: N/A;  . KNEE ARTHROSCOPY Left   . TEE WITHOUT CARDIOVERSION N/A 08/29/2015   Procedure: TRANSESOPHAGEAL ECHOCARDIOGRAM (TEE);  Surgeon: [name], MD;  Location: [facility];  Service: Open Heart Surgery;  Laterality: N/A;  . TOTAL KNEE ARTHROPLASTY Left 03/09/2017   Procedure: TOTAL KNEE ARTHROPLASTY;  Surgeon: Carole Civil, MD;  Location: [facility];  Service: Orthopedics;  Laterality: Left;    Current Outpatient Medications  Medication Sig Dispense Refill  . aspirin EC 81 MG tablet Take 81 mg by mouth daily.    . diphenhydrAMINE (BENADRYL) 50 MG tablet Take 1 tablet (50 mg total) by mouth at bedtime as needed for itching. 30 tablet 0  . furosemide (LASIX) 20 MG tablet Take 20 mg daily as needed for weight gain or swelling 90 tablet 3  . Garlic 1448 MG CAPS Take 1,000 mg by mouth daily.     . hydrochlorothiazide (HYDRODIURIL) 25 MG tablet TAKE 1/2 TABLET PO DAILY  3  . HYDROcodone-acetaminophen (NORCO/VICODIN) 5-325 MG tablet Take 1 tablet by mouth every 6 (six) hours as needed for moderate pain.    [name] insulin aspart (NOVOLOG) 100 UNIT/ML injection Inject into the skin 3 (three) times daily before meals.    . insulin lispro protamine-lispro (HUMALOG 75/25 MIX) (75-25) 100 UNIT/ML SUSP injection Inject 70 Units into the skin 2 (two) times daily with a meal.     . KOMBIGLYZE XR 2.04-999 MG TB24 Take 1 tablet by mouth 2 (two) times daily.     [name]  loratadine (CLARITIN) 10 MG tablet Take 10 mg by mouth daily.    . meloxicam (MOBIC) 15 MG tablet Take 15 mg by mouth daily.    . methocarbamol (ROBAXIN) 500 MG tablet TAKE 1 TABLET(500 MG) BY MOUTH THREE TIMES DAILY 56 tablet 0  . metoprolol tartrate (LOPRESSOR) 25 MG tablet TAKE 1/2  TABLET(12.5 MG) BY MOUTH TWICE DAILY 90 tablet 0  . olmesartan (BENICAR) 40 MG tablet Take 1 tablet (40 mg total) by mouth daily. 90 tablet 3  . omeprazole (PRILOSEC) 40 MG capsule Take 40 mg by mouth daily.     . pantoprazole (PROTONIX) 40 MG tablet Take 40 mg by mouth daily.    . pravastatin (PRAVACHOL) 20 MG tablet TAKE 1 TABLET(20 MG) BY MOUTH AT BEDTIME 90 tablet 3  . traMADol-acetaminophen (ULTRACET) 37.5-325 MG tablet Take 1 tablet by mouth every 4 (four) hours as needed. 60 tablet 5  . TRESIBA FLEXTOUCH 200 UNIT/ML SOPN     . TURMERIC CURCUMIN PO Take 1 tablet by mouth daily.      No current facility-administered medications for this visit.     Allergies as of 03/06/2018  . (No Known Allergies)    Vitals: BP 124/74   Pulse 85   Ht 5\' 10"  (1.778 m)   Wt 274 lb (124.3 kg)   BMI 39.31 kg/m  Last Weight:  Wt Readings from Last 1 Encounters:  03/06/18 274 lb (124.3 kg)   RSW:NIOE mass index is 39.31 kg/m.     Last Height:   Ht Readings from Last 1 Encounters:  03/06/18 5\' 10"  (1.778 m)    Physical exam:  General: The patient is awake, alert and appears not in acute distress. Head: Normocephalic, atraumatic. Neck is supple, but short and thick-. Mallampati 2  ,  neck circumference:18.00 . Nasal airflow is Patent. Retrognathia is not seen. Crowded dental status/ Cardiovascular:  Regular rate and rhythm , without  murmurs or carotid bruit.  Respiratory: Lungs are clear to auscultation. Skin:  Without evidence of edema, or rash Trunk: BMI 39 again - gained back from 37    Neurologic exam : The patient is awake and alert, oriented to place and time.  Mood and affect are appropriate.  Cranial nerves:  No change is ability to taste or smell. Pupils are equal and briskly reactive to light. Hearing to finger rub intact.   Facial sensation intact to fine touch.  Facial motor strength is symmetric and tongue and uvula move midline. Shoulder shrug was symmetrical.   Motor  exam:  Normal tone, muscle bulk and symmetric strength in all extremities. Limited ROM for both knees. Mr. Bacci does have advanced arthritis in both knees, and is scheduled for a total knee replacement was in the week, likely the other side will follow in June or July of this year.   The patient was advised of the nature of the diagnosed sleep disorder , the treatment options and risks for general a health and wellness arising from not treating the condition.  I spent more than 15 minutes of face to face time with the patient. Greater than 50% of time was spent in counseling and coordination of care. We have discussed the diagnosis and differential and I answered the patient's questions.     Assessment:  After physical and neurologic examination, review of laboratory studies.   Personal review of imaging studies, reports of other /same  Imaging studies, results of polysomnography/ neurophysiology testing and pre-existing records as far as provided  in visit.  His diabetes is better controlled - he has a no needle check device.  His sleep habits are stable , he rises at 6 AM.  He is a compliant CPAP user, lost power for 4 days which affected this years compliance. He felt the difference in sleep quality, wife noted jerking and snoring.     Plan:  Treatment plan and additional workup :  I like for Mr. Cerveny to continue using his CPAP at the current setting he is using also a dream wear mask and a dream station CPAP machine. I see no reason to change to auto CPAP.  He re-gained some weight in 2019 - I will send his for medical; weight management - Dr Leafy Ro.    I will see continue to see Mr. Recore once a year in follow-up.   Asencion Partridge Ronae Noell MD  03/06/2018   CC: [name], [place], [place] [address]

## 2018-03-26 ENCOUNTER — Other Ambulatory Visit: Payer: Self-pay | Admitting: Orthopedic Surgery

## 2018-03-26 DIAGNOSIS — G8929 Other chronic pain: Secondary | ICD-10-CM

## 2018-03-26 DIAGNOSIS — Z4889 Encounter for other specified surgical aftercare: Secondary | ICD-10-CM

## 2018-03-26 DIAGNOSIS — Z96652 Presence of left artificial knee joint: Secondary | ICD-10-CM

## 2018-03-26 DIAGNOSIS — M25561 Pain in right knee: Principal | ICD-10-CM

## 2018-03-30 ENCOUNTER — Encounter (INDEPENDENT_AMBULATORY_CARE_PROVIDER_SITE_OTHER): Payer: Federal, State, Local not specified - PPO

## 2018-04-03 ENCOUNTER — Encounter (INDEPENDENT_AMBULATORY_CARE_PROVIDER_SITE_OTHER): Payer: Self-pay

## 2018-04-03 ENCOUNTER — Ambulatory Visit (INDEPENDENT_AMBULATORY_CARE_PROVIDER_SITE_OTHER): Payer: Federal, State, Local not specified - PPO | Admitting: Family Medicine

## 2018-04-10 DIAGNOSIS — E1165 Type 2 diabetes mellitus with hyperglycemia: Secondary | ICD-10-CM | POA: Diagnosis not present

## 2018-04-10 DIAGNOSIS — I10 Essential (primary) hypertension: Secondary | ICD-10-CM | POA: Diagnosis not present

## 2018-04-10 DIAGNOSIS — E7800 Pure hypercholesterolemia, unspecified: Secondary | ICD-10-CM | POA: Diagnosis not present

## 2018-04-10 DIAGNOSIS — N189 Chronic kidney disease, unspecified: Secondary | ICD-10-CM | POA: Diagnosis not present

## 2018-04-11 ENCOUNTER — Ambulatory Visit: Payer: Federal, State, Local not specified - PPO | Admitting: Orthopedic Surgery

## 2018-04-12 ENCOUNTER — Encounter: Payer: Self-pay | Admitting: Orthopedic Surgery

## 2018-04-12 ENCOUNTER — Ambulatory Visit: Payer: Federal, State, Local not specified - PPO | Admitting: Orthopedic Surgery

## 2018-04-12 VITALS — BP 102/66 | HR 87 | Ht 70.0 in | Wt 283.0 lb

## 2018-04-12 DIAGNOSIS — M25561 Pain in right knee: Secondary | ICD-10-CM | POA: Diagnosis not present

## 2018-04-12 DIAGNOSIS — M7542 Impingement syndrome of left shoulder: Secondary | ICD-10-CM

## 2018-04-12 DIAGNOSIS — M25512 Pain in left shoulder: Secondary | ICD-10-CM

## 2018-04-12 DIAGNOSIS — G8929 Other chronic pain: Secondary | ICD-10-CM

## 2018-04-12 MED ORDER — TRAMADOL-ACETAMINOPHEN 37.5-325 MG PO TABS: 1.0000 | ORAL_TABLET | ORAL | 0 refills | Status: DC | PRN

## 2018-04-12 MED ORDER — MELOXICAM 15 MG PO TABS: 15.0000 mg | ORAL_TABLET | Freq: Every day | ORAL | 5 refills | Status: DC

## 2018-04-12 NOTE — Progress Notes (Signed)
Progress Note   Patient ID: [name], male   DOB: [birth year], 59 y.o.   MRN: [id number]  Chief Complaint  Patient presents with  . Follow-up    Recheck on left shoulder.     Medical decision-making Encounter Diagnosis  Name Primary?  . Impingement syndrome of left shoulder region Yes   No improvement left shoulder after injection and Mobic as well as home exercises PLAN: MRI scan left shoulder evaluate rotator cuff re-tear and possible surgical repair     Chief Complaint  Patient presents with  . Follow-up    Recheck on left shoulder.    No improvement in the left shoulder, previous history no changes HPI [name] is a 57 y.o. male.  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  Musculoskeletal: Negative for neck pain.  Neurological: Negative for tingling and sensory change.       ROS No outpatient medications have been marked as taking for the 04/12/18 encounter (Office Visit) with Christopher Civil, MD.    No Known Allergies   BP 102/66   Pulse 87   Ht 5\' 10"  (1.778 m)   Wt 283 lb (128.4 kg)   BMI 40.61 kg/m   Physical Exam  Constitutional: He is oriented to person, place, and time. He appears well-developed and well-nourished.  Vital signs have been reviewed and are stable. Gen. appearance the patient is well-developed and well-nourished with normal grooming and hygiene.   Musculoskeletal:       Arms: Neurological: He is alert and oriented to person, place, and time.  Skin: Skin is warm and dry. No erythema.  Psychiatric: He has a normal mood and affect.  Vitals reviewed.      Arther Abbott,  MD 04/12/2018 9:24 AM

## 2018-04-12 NOTE — Addendum Note (Signed)
Addended byCandice Camp on: 04/12/2018 09:50 AM   Modules accepted: Orders

## 2018-04-17 ENCOUNTER — Ambulatory Visit (HOSPITAL_COMMUNITY)
Admission: RE | Admit: 2018-04-17 | Discharge: 2018-04-17 | Disposition: A | Discharge status: Home / Self Care | Payer: Federal, State, Local not specified - PPO | Source: Ambulatory Visit | Attending: Orthopedic Surgery | Admitting: Orthopedic Surgery

## 2018-04-17 ENCOUNTER — Encounter (HOSPITAL_COMMUNITY): Payer: Self-pay

## 2018-04-17 DIAGNOSIS — M25512 Pain in left shoulder: Principal | ICD-10-CM

## 2018-04-17 DIAGNOSIS — G8929 Other chronic pain: Secondary | ICD-10-CM

## 2018-04-20 ENCOUNTER — Ambulatory Visit: Payer: Federal, State, Local not specified - PPO | Admitting: Orthopedic Surgery

## 2018-04-20 NOTE — Patient Instructions (Signed)
Your OPEN  MRI has been ordered for [name], [place].  Their scheduling number is [phone].  Call them to schedule the appointment .

## 2018-04-21 NOTE — Progress Notes (Signed)
Appointment rescheduled.

## 2018-04-24 ENCOUNTER — Telehealth: Payer: Self-pay | Admitting: Orthopedic Surgery

## 2018-04-24 NOTE — Telephone Encounter (Signed)
Patient called stating his MRI is scheduled for 5/15. He is asking if Dr. Aline Brochure would send something to his pharmacy to take that day.  PATIENT USES [facility] ON [address]

## 2018-04-25 ENCOUNTER — Other Ambulatory Visit: Payer: Self-pay | Admitting: Orthopedic Surgery

## 2018-04-25 MED ORDER — ALPRAZOLAM 0.25 MG PO TABS: 0.2500 mg | ORAL_TABLET | Freq: Once | ORAL | 0 refills | Status: AC

## 2018-04-26 DIAGNOSIS — M75112 Incomplete rotator cuff tear or rupture of left shoulder, not specified as traumatic: Secondary | ICD-10-CM | POA: Diagnosis not present

## 2018-04-26 DIAGNOSIS — M19012 Primary osteoarthritis, left shoulder: Secondary | ICD-10-CM | POA: Diagnosis not present

## 2018-04-26 NOTE — Telephone Encounter (Signed)
Patient states med has no instructions, but I do not see where you sent in meds please advise.

## 2018-04-26 NOTE — Telephone Encounter (Signed)
Patient called back about medication prescribed to be taken prior to scheduled MRI - today, 5/15.  States picked up from [facility] and that it has no instructions.  Please call [phone]

## 2018-04-27 NOTE — Telephone Encounter (Signed)
Dose: 0.25 mg Route: Oral Frequency: Once Dispense Quantity: 1 tablet Refills: 0 Fills remaining: --      Sig: Take 1 tablet (0.25 mg total) by mouth once for 1 dose. For mri     Written Date: 04/25/18 Expiration Date: 10/22/18   Start Date: 04/25/18 End Date: 04/25/18 after 1 doses       Ordering Provider:  -- DEA #:  [id number] NPI:  --  Authorizing Provider:  Carole Civil, MD DEA #:  [id number] NPI:  [id number]  Ordering User:  Carole Civil, MD         Pharmacy:  [facility] [place], [place] [address] AT [address]. [place] #:  [id number]   Pharmacy Comments:  --     Fill quantity remaining:  -- Fill quantity used:  --

## 2018-05-01 ENCOUNTER — Ambulatory Visit: Payer: Federal, State, Local not specified - PPO | Admitting: Orthopedic Surgery

## 2018-05-01 ENCOUNTER — Telehealth: Payer: Self-pay | Admitting: Orthopedic Surgery

## 2018-05-01 VITALS — BP 96/64 | HR 100 | Ht 70.0 in | Wt 283.0 lb

## 2018-05-01 DIAGNOSIS — Z4889 Encounter for other specified surgical aftercare: Secondary | ICD-10-CM | POA: Diagnosis not present

## 2018-05-01 DIAGNOSIS — Z96652 Presence of left artificial knee joint: Secondary | ICD-10-CM | POA: Diagnosis not present

## 2018-05-01 DIAGNOSIS — M25512 Pain in left shoulder: Secondary | ICD-10-CM | POA: Diagnosis not present

## 2018-05-01 DIAGNOSIS — G8929 Other chronic pain: Secondary | ICD-10-CM

## 2018-05-01 DIAGNOSIS — M75112 Incomplete rotator cuff tear or rupture of left shoulder, not specified as traumatic: Secondary | ICD-10-CM

## 2018-05-01 MED ORDER — METHOCARBAMOL 500 MG PO TABS: ORAL_TABLET | ORAL | 0 refills | Status: DC

## 2018-05-01 NOTE — Patient Instructions (Signed)
You have decided to proceed with rotator cuff repair surgery. You have decided not to continue with nonoperative measures such as but not limited to oral medication,   activity modification, physical therapy, or injection.  We will perform a rotator cuff repair. Some of the risks associated with rotator cuff repair include but are not limited to Bleeding Infection Swelling Stiffness Blood clot Pain Re-tearing of the rotator cuff Failure of the rotator cuff to heal   If you're not comfortable with these risks and would like to continue with nonoperative treatment please let Dr. Aline Brochure know prior to your surgery.

## 2018-05-01 NOTE — Progress Notes (Signed)
FOLLOW UP VISIT : MRI RESULTS   Chief Complaint  Patient presents with  . Follow-up    MRI results of left shoulder.     HPI: The patient is here TO DISCUSS THE RESULTS OF MRI  57 year old male with diabetes hypertension status post CABG status post knee replacement presents back for follow-up regarding his left shoulder MRI.  He has left shoulder pain weakness decreased range of motion and he has had that for several months is not responded to nonoperative treatment including exercises and injection.  He is a appliance repair person   Review of Systems  Constitutional: Negative.   Respiratory: Negative.   Cardiovascular: Negative.   Neurological: Positive for focal weakness. Negative for tingling.   Past Medical History:  Diagnosis Date  . Arthritis   . CAD (coronary artery disease)    Multivessel disease status post CABG 08/2015  . Essential hypertension   . Hyperlipidemia   . Hypertension   . OSA on CPAP   . Type 2 diabetes mellitus ([facility])      BP 96/64   Pulse 100   Ht 5\' 10"  (1.778 m)   Wt 283 lb (128.4 kg)   BMI 40.61 kg/m   Physical Exam  Constitutional: He is oriented to person, place, and time. He appears well-developed and well-nourished.  Vital signs have been reviewed and are stable. Gen. appearance the patient is well-developed and well-nourished with normal grooming and hygiene.   Musculoskeletal:       Arms: Neurological: He is alert and oriented to person, place, and time.  Skin: Skin is warm and dry. No erythema.  Psychiatric: He has a normal mood and affect.  Vitals reviewed.    Medical decision-making section   DATA  MRI REPORT:  COMPARISON:None. INDICATION: Pain in left shoulder TECHNIQUE:MRI SHOULDER LEFT WO IV CONTRAST-- Multiplanar, multisequence MRI of the shoulder (MRI SHOULDER LEFT WO IV CONTRAST) was performed.  FINDINGS:   ROTATOR CUFF/BICEPS TENDON: - Small deep bursal anterior footprint tear of the supraspinatus  tendon. - Intact rotator cuff muscle belly volume. - Minimal fluid accumulation in the subacromial-subdeltoid bursa.  - Intact long head biceps tendon.   OSSEOUS STRUCTURES/JOINTS: - Mild acromioclavicular osteoarthritis.  - No acute fractures or dislocations. - No evidence of avascular necrosis. - No significant glenohumeral joint effusions. - No labral tear is identified.   SOFT TISSUES: - No abnormal masses or fluid collections.   Other Result Information  Acute Interface, Incoming Rad Results - 04/26/2018  7:35 PM EDT COMPARISON:  None.   INDICATION: Pain in left shoulder   TECHNIQUE:MRI SHOULDER LEFT WO IV CONTRAST-- Multiplanar, multisequence MRI of the shoulder (MRI SHOULDER LEFT WO IV CONTRAST) was performed.  FINDINGS:     ROTATOR CUFF/BICEPS TENDON: - Small deep bursal anterior footprint tear of the supraspinatus tendon. - Intact rotator cuff muscle belly volume. - Minimal fluid accumulation in the subacromial-subdeltoid bursa.  - Intact long head biceps tendon.     OSSEOUS STRUCTURES/JOINTS: - Mild acromioclavicular osteoarthritis.  - No acute fractures or dislocations. - No evidence of avascular necrosis. - No significant glenohumeral joint effusions. - No labral tear is identified.   SOFT TISSUES: - No abnormal masses or fluid collections.    IMPRESSION: 1.  Small anterior supraspinatus deep bursal footprint tear. 2.  Mild acromioclavicular osteoarthritis.   Electronically Signed by: Mariea Clonts      MY READING: MRI OF THE   I do see that this patient has a rotator cuff tear and  some mild degenerative arthritis of the acromioclavicular joint there does not appear to be any atrophy the tear appears to be anterior and about 90% if not 100% at the anterior margin of the supraspinatus   Encounter Diagnoses  Name Primary?  . Chronic left shoulder pain Yes  . Nontraumatic incomplete tear of left rotator cuff   . Aftercare following surgery   .  Status post total left knee replacement      PLAN: I discussed with him the possibility rotator cuff repair out of work 6 months  He needs a preop medical clearance and we will proceed with an open rotator cuff repair left shoulder using ArthroCare speed screw

## 2018-05-01 NOTE — Telephone Encounter (Signed)
Patient called stating he has contacted his PCP and everything is in motion so he can get his surgery scheduled in June.

## 2018-05-01 NOTE — Telephone Encounter (Signed)
I did send a letter asking for clearance to his PCP, I do not have their correspondence yet.   Patient states he is good to schedule (he has an appt with [facility] on 05/04/[facility], do you want to schedule or wait for clearance and cardiology visit?

## 2018-05-03 ENCOUNTER — Encounter: Payer: Self-pay | Admitting: Physician Assistant

## 2018-05-03 DIAGNOSIS — M170 Bilateral primary osteoarthritis of knee: Secondary | ICD-10-CM | POA: Diagnosis not present

## 2018-05-03 DIAGNOSIS — Z01811 Encounter for preprocedural respiratory examination: Secondary | ICD-10-CM | POA: Diagnosis not present

## 2018-05-03 DIAGNOSIS — Z6841 Body Mass Index (BMI) 40.0 and over, adult: Secondary | ICD-10-CM | POA: Diagnosis not present

## 2018-05-03 DIAGNOSIS — Z1389 Encounter for screening for other disorder: Secondary | ICD-10-CM | POA: Diagnosis not present

## 2018-05-03 DIAGNOSIS — E782 Mixed hyperlipidemia: Secondary | ICD-10-CM | POA: Diagnosis not present

## 2018-05-03 DIAGNOSIS — I10 Essential (primary) hypertension: Secondary | ICD-10-CM | POA: Diagnosis not present

## 2018-05-03 NOTE — Progress Notes (Addendum)
Cardiology Office Note    Date:  05/04/2018  ID:  [name], [name] 25-Nov-1961, MRN [id number] PCP:  [name], MD  Cardiologist:  Rozann Lesches, MD  Chief Complaint: leg swelling  History of Present Illness:  [name] is a 57 y.o. male with history of CAD s/p CABG 08/2015, chronic leg edema, HTN, HLD, OSA on CPAP, DM, leg edema, probable CKD II per labs who presents for evaluation of leg swelling. 2D Echo 05/2017 showed mild LVH, EF 55-60%, mild MR, reduced RV function. He did have an EF of 45-50% by echo prior to bypass surgery but I do not see that Dr. Domenic Polite has considered him to have formal dx of CHF in recent notes. He has history of low BP in the past. Last labs 02/2017 showed Hgb 11.3, plt 132, NA 131, K 3.8, Cr 1.19 in setting of knee surgery.  He returns for follow-up today. He has h/o intermittent LEE which has crept back up the last few weeks bilaterally. Reports compliance with meds. He reports he is on a combo of HCTZ plus PRN Lasix because in the past he states when HCTZ was stopped even while taking Lasix, his legs became much bigger. He restarted Lasix 20mg  4 days ago with minimal improvement in edema. No known hx of pulm disease. PFT results from pre cabg are not in the system. No chest pain. He has mild dyspnea with higher activities but not out of the ordinary. His BP ranges from 90s-130s at home. Drinks 8-10 bottles of water daily, watches salt in diet.  Past Medical History:  Diagnosis Date  . Arthritis   . CAD (coronary artery disease)    Multivessel disease status post CABG 08/2015  . CKD (chronic kidney disease), stage II   . Essential hypertension   . Hyperlipidemia   . Hypertension   . OSA on CPAP   . Type 2 diabetes mellitus ([place])     Past Surgical History:  Procedure Laterality Date  . APPENDECTOMY    . Biceps tendon surgery Right   . CARDIAC CATHETERIZATION N/A 08/25/2015   Procedure: Left Heart Cath and Coronary Angiography;   Surgeon: Belva Crome, MD;  Location: [facility];  Service: Cardiovascular;  Laterality: N/A;  . CORONARY ARTERY BYPASS GRAFT N/A 08/29/2015   Procedure: CORONARY ARTERY BYPASS GRAFTING (CABG);  Surgeon: [name], MD;  Location: [facility];  Service: Open Heart Surgery;  Laterality: N/A;  . KNEE ARTHROSCOPY Left   . TEE WITHOUT CARDIOVERSION N/A 08/29/2015   Procedure: TRANSESOPHAGEAL ECHOCARDIOGRAM (TEE);  Surgeon: [name], MD;  Location: [facility];  Service: Open Heart Surgery;  Laterality: N/A;  . TOTAL KNEE ARTHROPLASTY Left 03/09/2017   Procedure: TOTAL KNEE ARTHROPLASTY;  Surgeon: Carole Civil, MD;  Location: [facility];  Service: Orthopedics;  Laterality: Left;    Current Medications: Current Meds  Medication Sig  . aspirin EC 81 MG tablet Take 81 mg by mouth daily.  . diphenhydrAMINE (BENADRYL) 50 MG tablet Take 1 tablet (50 mg total) by mouth at bedtime as needed for itching.  . furosemide (LASIX) 20 MG tablet Take 20 mg daily as needed for weight gain or swelling  . hydrochlorothiazide (HYDRODIURIL) 25 MG tablet TAKE 1/2 TABLET PO DAILY  . HYDROcodone-acetaminophen (NORCO/VICODIN) 5-325 MG tablet Take 1 tablet by mouth every 6 (six) hours as needed for moderate pain.  [name] insulin aspart (NOVOLOG) 100 UNIT/ML injection Inject into the skin 3 (three) times daily  before meals.  . insulin lispro protamine-lispro (HUMALOG 75/25 MIX) (75-25) 100 UNIT/ML SUSP injection Inject 70 Units into the skin 2 (two) times daily with a meal.   . KOMBIGLYZE XR 2.04-999 MG TB24 Take 1 tablet by mouth 2 (two) times daily.   [name] loratadine (CLARITIN) 10 MG tablet Take 10 mg by mouth daily.  . meloxicam (MOBIC) 15 MG tablet Take 1 tablet (15 mg total) by mouth daily.  . methocarbamol (ROBAXIN) 500 MG tablet TAKE 1 TABLET(500 MG) BY MOUTH THREE TIMES DAILY  . metoprolol tartrate (LOPRESSOR) 25 MG tablet TAKE 1/2 TABLET(12.5 MG) BY MOUTH TWICE DAILY  . olmesartan (BENICAR) 40 MG tablet  Take 1 tablet (40 mg total) by mouth daily.  [name] omeprazole (PRILOSEC) 40 MG capsule Take 40 mg by mouth daily.   . pravastatin (PRAVACHOL) 20 MG tablet TAKE 1 TABLET(20 MG) BY MOUTH AT BEDTIME  . traMADol-acetaminophen (ULTRACET) 37.5-325 MG tablet Take 1 tablet by mouth every 4 (four) hours as needed. for pain  . TRESIBA FLEXTOUCH 200 UNIT/ML SOPN   . TURMERIC CURCUMIN PO Take 1 tablet by mouth daily.      Allergies:   Patient has no known allergies.   Social History   Socioeconomic History  . Marital status: Married    Spouse name: Not on file  . Number of children: Not on file  . Years of education: college  . Highest education level: Not on file  Occupational History  . Occupation: Magazine features editor: [employer]  Social Needs  . Financial resource strain: Not on file  . Food insecurity:    Worry: Not on file    Inability: Not on file  . Transportation needs:    Medical: Not on file    Non-medical: Not on file  Tobacco Use  . Smoking status: Never Smoker  . Smokeless tobacco: Never Used  Substance and Sexual Activity  . Alcohol use: No    Alcohol/week: 0.0 oz  . Drug use: No  . Sexual activity: Yes    Birth control/protection: None  Lifestyle  . Physical activity:    Days per week: Not on file    Minutes per session: Not on file  . Stress: Not on file  Relationships  . Social connections:    Talks on phone: Not on file    Gets together: Not on file    Attends religious service: Not on file    Active member of club or organization: Not on file    Attends meetings of clubs or organizations: Not on file    Relationship status: Not on file  Other Topics Concern  . Not on file  Social History Narrative  . Not on file     Family History:  The patient's *family history includes Arthritis in his unknown relative; CAD in his brother and father; Cancer in his unknown relative; Diabetes in his unknown relative; Diabetes Mellitus II in his brother and  father. There is no history of Anesthesia problems, Hypotension, Malignant hyperthermia, or Pseudochol deficiency.  ROS:   Please see the history of present illness. All other systems are reviewed and otherwise negative.    PHYSICAL EXAM:   VS:  BP 120/65   Pulse 93   Ht 5\' 10"  (1.778 m)   Wt 274 lb (124.3 kg)   SpO2 96%   BMI 39.31 kg/m   BMI: Body mass index is 39.31 kg/m. GEN: Well nourished, well developed obese WM, in no acute distress  HEENT: normocephalic, atraumatic Neck: no JVD, carotid bruits, or masses Cardiac: RRR; no murmurs, rubs, or gallops, 1+ bilateral LE pitting edema wiithout significant erhythema. No palpable cords Respiratory:  clear to auscultation bilaterally, normal work of breathing GI: soft, nontender, nondistended, + BS MS: no deformity or atrophy Skin: warm and dry, no rash Neuro:  Alert and Oriented x 3, Strength and sensation are intact, follows commands Psych: euthymic mood, full affect  Wt Readings from Last 3 Encounters:  05/04/18 274 lb (124.3 kg)  05/01/18 283 lb (128.4 kg)  04/20/18 283 lb (128.4 kg)      Studies/Labs Reviewed:   EKG:  EKG was ordered today and personally reviewed by me and demonstrates NSR 86bpm, prior anterior infarct, nonacute.  Recent Labs: No results found for requested labs within last 8760 hours.   Lipid Panel    Component Value Date/Time   CHOL 166 12/18/2015 0819   TRIG 205 (H) 12/18/2015 0819   HDL 35 (L) 12/18/2015 0819   CHOLHDL 4.7 12/18/2015 0819   VLDL 41 (H) 12/18/2015 0819   LDLCALC 90 12/18/2015 0819    Additional studies/ records that were reviewed today include: Summarized above.    ASSESSMENT & PLAN:   1. Lower extremity edema - prior echo showed impaired RV function but was a difficult study. He has history of OSA but reports compliance with CPAP and regular f/u for this. Will repeat echo. If RV function remains impaired, would recommend proceeding with PFTs to evaluate for underlying  pulm disease. He is a nonsmoker but did have exposure to secondhand [place]. Check CBC, CMET, TSH today. Increase Lasix to 40mg  daily and hold ARB to allow BP room. Continue HCTZ at low dose given his prior reports of worsening edema when off this. Warning sx reviewed with patient. He also drinks 8-10 bottles of water daily which is likely contributing. Reviewed 2g sodium restriction, 2L fluid restriction with patient. Also gave instructions on daily weights. 2. CAD - no recent angina. EKG stable. Should theoretically be on higher intensity statin than pravastatin. He denies prior statin intolerances. He's been on this even after CABG. Check LFTs/lipids today and consider titration. 3. CKD II - borderline Cr noted on prior labs, recheck today. He reports UAs followed by PCP. 4. Essential HTN - follow with changes above. Recheck BP by me 120/65.  Disposition: F/u with APP in 2 weeks for recheck.   Medication Adjustments/Labs and Tests Ordered: Current medicines are reviewed at length with the patient today.  Concerns regarding medicines are outlined above. Medication changes, Labs and Tests ordered today are summarized above and listed in the Patient Instructions accessible in Encounters.   Signed, Charlie Pitter, PA-C  05/04/2018 3:39 PM    [facility] Location in [place], [place] [address] Ph: [phone]; Fax [phone]

## 2018-05-04 ENCOUNTER — Encounter: Payer: Self-pay | Admitting: Physician Assistant

## 2018-05-04 ENCOUNTER — Encounter: Payer: Self-pay | Admitting: *Deleted

## 2018-05-04 ENCOUNTER — Other Ambulatory Visit (HOSPITAL_COMMUNITY)
Admission: RE | Admit: 2018-05-04 | Discharge: 2018-05-04 | Disposition: A | Discharge status: Home / Self Care | Payer: Federal, State, Local not specified - PPO | Source: Ambulatory Visit | Attending: Physician Assistant | Admitting: Physician Assistant

## 2018-05-04 ENCOUNTER — Ambulatory Visit: Payer: Federal, State, Local not specified - PPO | Admitting: Physician Assistant

## 2018-05-04 VITALS — BP 120/65 | HR 93 | Ht 70.0 in | Wt 274.0 lb

## 2018-05-04 DIAGNOSIS — R600 Localized edema: Secondary | ICD-10-CM | POA: Insufficient documentation

## 2018-05-04 DIAGNOSIS — I2510 Atherosclerotic heart disease of native coronary artery without angina pectoris: Secondary | ICD-10-CM

## 2018-05-04 DIAGNOSIS — N182 Chronic kidney disease, stage 2 (mild): Secondary | ICD-10-CM

## 2018-05-04 DIAGNOSIS — I10 Essential (primary) hypertension: Secondary | ICD-10-CM | POA: Diagnosis not present

## 2018-05-04 LAB — COMPREHENSIVE METABOLIC PANEL
ALT: 29 U/L (ref 17–63)
AST: 98 U/L — ABNORMAL HIGH (ref 15–41)
Albumin: 3.5 g/dL (ref 3.5–5.0)
Alkaline Phosphatase: 68 U/L (ref 38–126)
Anion gap: 10 (ref 5–15)
BUN: 28 mg/dL — ABNORMAL HIGH (ref 6–20)
CO2: 23 mmol/L (ref 22–32)
Calcium: 9.1 mg/dL (ref 8.9–10.3)
Chloride: 102 mmol/L (ref 101–111)
Creatinine, Ser: 1.59 mg/dL — ABNORMAL HIGH (ref 0.61–1.24)
GFR calc Af Amer: 54 mL/min — ABNORMAL LOW (ref >60)
GFR calc non Af Amer: 46 mL/min — ABNORMAL LOW (ref >60)
Glucose, Bld: 139 mg/dL — ABNORMAL HIGH (ref 65–99)
Potassium: 3.8 mmol/L (ref 3.5–5.1)
Sodium: 135 mmol/L (ref 135–145)
Total Bilirubin: 1 mg/dL (ref 0.3–1.2)
Total Protein: 8.2 g/dL — ABNORMAL HIGH (ref 6.5–8.1)

## 2018-05-04 LAB — LIPID PANEL
Cholesterol: 177 mg/dL (ref 0–200)
HDL: 29 mg/dL — ABNORMAL LOW (ref >40)
LDL Cholesterol: 110 mg/dL — ABNORMAL HIGH (ref 0–99)
Total CHOL/HDL Ratio: 6.1 RATIO
Triglycerides: 190 mg/dL — ABNORMAL HIGH (ref <150)
VLDL: 38 mg/dL (ref 0–40)

## 2018-05-04 LAB — BRAIN NATRIURETIC PEPTIDE: B Natriuretic Peptide: 29 pg/mL (ref 0.0–100.0)

## 2018-05-04 LAB — TSH: TSH: 2.42 u[IU]/mL (ref 0.350–4.500)

## 2018-05-04 MED ORDER — FUROSEMIDE 20 MG PO TABS: 20.0000 mg | ORAL_TABLET | Freq: Every day | ORAL | 3 refills | Status: DC

## 2018-05-04 NOTE — Patient Instructions (Addendum)
Medication Instructions:  Your physician has recommended you make the following change in your medication:  Stop Taking Prilosec  Increase Lasix to 2 Tablets ( 40 mg ) Daily    Labwork: Your physician recommends that you return for lab work today.    Testing/Procedures: Your physician has requested that you have an echocardiogram. Echocardiography is a painless test that uses sound waves to create images of your heart. It provides your doctor with information about the size and shape of your heart and how well your heart's chambers and valves are working. This procedure takes approximately one hour. There are no restrictions for this procedure.    Follow-Up: Your physician recommends that you schedule a follow-up appointment in: 2 Weeks    Any Other Special Instructions Will Be Listed Below (If Applicable).     If you need a refill on your cardiac medications before your next appointment, please call your pharmacy.    For patients with fluid retention, we give them these special instructions:  1. Follow a low-salt diet - you are allowed no more than 2,000mg  of sodium per day. Watch your fluid intake. In general, you should not be taking in more than 2 liters of fluid per day (no more than 8 glasses per day). This includes sources of water in foods like soup, coffee, tea, milk, etc. 2. Weigh yourself on the same scale at same time of day and keep a log. 3. Call your doctor: (Anytime you feel any of the following symptoms)  - 3lb weight gain overnight or 5lb within a few days - Shortness of breath, with or without a dry hacking cough  - Swelling in the hands, feet or stomach  - If you have to sleep on extra pillows at night in order to breathe   IT IS IMPORTANT TO LET YOUR DOCTOR KNOW EARLY ON IF YOU ARE HAVING SYMPTOMS SO WE CAN HELP YOU!

## 2018-05-04 NOTE — Telephone Encounter (Signed)
Okay to schedule

## 2018-05-04 NOTE — Telephone Encounter (Signed)
Have gotten letter from PCP nurse regarding surgical clearance / will you review? I do nto see a risk determination, there is a note regarding uncontrolled diabetes on 3 insulin meds plus Kombiglyze / has referred to cardiology for leg swelling  Letter I received is on the surgery book.

## 2018-05-05 ENCOUNTER — Telehealth: Payer: Self-pay | Admitting: *Deleted

## 2018-05-05 DIAGNOSIS — I5022 Chronic systolic (congestive) heart failure: Secondary | ICD-10-CM

## 2018-05-05 DIAGNOSIS — E782 Mixed hyperlipidemia: Secondary | ICD-10-CM

## 2018-05-05 NOTE — Telephone Encounter (Signed)
Please call patient.   Labs show that BUN/Cr are slightly up from prior. I reviewed records from [place] in 02/2018 - BUN/Cr were 23/1.42 with AST 92 and ALT 36 at that time. So labs are not markedly different from prior, but still creeping upward. I reviewed his case with Dr. Domenic Polite. At this time we would recommend:   - at visit yesterday, we increased Lasix to 40mg  daily - would do the higher dose of 40mg  for 2 days total (yesterday and today) then reduce back down and continue 20mg  daily. I anticipate that with the Lasix and really scaling back his fluid intake, his swelling will improve.   - remain off Benicar for now, but might restart at a lower dose down the road given his diabetes. NOTE: I reviewed his AVS and Alda Berthold wrote "stop taking Prilosec." We did not discuss the Prilosec - this was supposed to stay stop taking BENICAR. Please clarify with patient.   - recheck CMET in 1 week   - his liver enzymes remain abnormal as they were earlier this year by primary care. I am not sure what kind of workup he has had for this, but since his echo in 2018 showed reduced right heart function, he may need further evaluation for liver disease as these two problems (right heart failure and liver disease) can go hand in hand. As such, would not increase his cholesterol medicine at this time, but if he would like, I would suggest referral to the lipid clinic to discuss PCSK9 inhibitors which are injectable cholesterol medications that do not interfere with liver function. LDL is too high for someone with diabetes and CAD - goal <70 and his is 110. The latter issue is obivously independent of his acute problems but wanted to offer it to him.   Dayna Dunn PA-C       Pt aware and voiced understanding of plan - will come by office next Thursday to pick up lab orders CMP - also will update medication list pt will stay off Benicar (pt clarified that he hasn't been taking this) avoid NSAIDS and reduce lasix 20 mg  daily - pt is agreeable to lipid clinic referral and will place orders for referral

## 2018-05-05 NOTE — Telephone Encounter (Signed)
-----   Message from Charlie Pitter, [place] sent at 05/05/2018 10:50 AM EDT ----- Alison Stalling to add: he is also on a medicine called meloxicam. Patients with kidney issues should generally stay away from medicines like ibuprofen, Advil, Motrin, naproxen, and Aleve due to risk of worsening kidney or liver function. I also see the benicar is on his med list still - please stop it. (see prior result note) Dayna Dunn PA-C

## 2018-05-09 NOTE — Telephone Encounter (Signed)
Send him back for a formal Dr. preop clearance or were not doing the surgery

## 2018-05-10 ENCOUNTER — Ambulatory Visit (HOSPITAL_COMMUNITY)
Admission: RE | Admit: 2018-05-10 | Discharge: 2018-05-10 | Disposition: A | Discharge status: Home / Self Care | Payer: Federal, State, Local not specified - PPO | Source: Ambulatory Visit | Attending: Physician Assistant | Admitting: Physician Assistant

## 2018-05-10 DIAGNOSIS — Z951 Presence of aortocoronary bypass graft: Secondary | ICD-10-CM | POA: Insufficient documentation

## 2018-05-10 DIAGNOSIS — E785 Hyperlipidemia, unspecified: Secondary | ICD-10-CM | POA: Diagnosis not present

## 2018-05-10 DIAGNOSIS — Z6841 Body Mass Index (BMI) 40.0 and over, adult: Secondary | ICD-10-CM | POA: Diagnosis not present

## 2018-05-10 DIAGNOSIS — Z1389 Encounter for screening for other disorder: Secondary | ICD-10-CM | POA: Diagnosis not present

## 2018-05-10 DIAGNOSIS — E119 Type 2 diabetes mellitus without complications: Secondary | ICD-10-CM | POA: Insufficient documentation

## 2018-05-10 DIAGNOSIS — I350 Nonrheumatic aortic (valve) stenosis: Secondary | ICD-10-CM | POA: Insufficient documentation

## 2018-05-10 DIAGNOSIS — I119 Hypertensive heart disease without heart failure: Secondary | ICD-10-CM | POA: Insufficient documentation

## 2018-05-10 DIAGNOSIS — I252 Old myocardial infarction: Secondary | ICD-10-CM | POA: Insufficient documentation

## 2018-05-10 DIAGNOSIS — S30860A Insect bite (nonvenomous) of lower back and pelvis, initial encounter: Secondary | ICD-10-CM | POA: Diagnosis not present

## 2018-05-10 DIAGNOSIS — W57XXXA Bitten or stung by nonvenomous insect and other nonvenomous arthropods, initial encounter: Secondary | ICD-10-CM | POA: Diagnosis not present

## 2018-05-10 DIAGNOSIS — G4733 Obstructive sleep apnea (adult) (pediatric): Secondary | ICD-10-CM | POA: Insufficient documentation

## 2018-05-10 DIAGNOSIS — R600 Localized edema: Secondary | ICD-10-CM | POA: Insufficient documentation

## 2018-05-10 MED ORDER — PERFLUTREN LIPID MICROSPHERE
1.0000 mL | INTRAVENOUS | Status: AC | PRN
  Administered 2018-05-10: 1 mL via INTRAVENOUS
  Administered 2018-05-10: 2 mL via INTRAVENOUS
  Filled 2018-05-10: qty 10

## 2018-05-10 NOTE — Telephone Encounter (Signed)
Letter sent.

## 2018-05-10 NOTE — Progress Notes (Signed)
*  PRELIMINARY RESULTS* Echocardiogram 2D Echocardiogram has been performed with Def[facility].  [name] 05/10/2018, 4:09 PM

## 2018-05-11 ENCOUNTER — Telehealth: Payer: Self-pay | Admitting: *Deleted

## 2018-05-11 ENCOUNTER — Other Ambulatory Visit (HOSPITAL_COMMUNITY)
Admission: RE | Admit: 2018-05-11 | Discharge: 2018-05-11 | Disposition: A | Discharge status: Home / Self Care | Payer: Federal, State, Local not specified - PPO | Source: Ambulatory Visit | Attending: Physician Assistant | Admitting: Physician Assistant

## 2018-05-11 DIAGNOSIS — I5022 Chronic systolic (congestive) heart failure: Secondary | ICD-10-CM | POA: Diagnosis not present

## 2018-05-11 DIAGNOSIS — Z79899 Other long term (current) drug therapy: Secondary | ICD-10-CM

## 2018-05-11 LAB — COMPREHENSIVE METABOLIC PANEL
ALT: 14 U/L — ABNORMAL LOW (ref 17–63)
AST: 63 U/L — ABNORMAL HIGH (ref 15–41)
Albumin: 3.4 g/dL — ABNORMAL LOW (ref 3.5–5.0)
Alkaline Phosphatase: 69 U/L (ref 38–126)
Anion gap: 10 (ref 5–15)
BUN: 24 mg/dL — ABNORMAL HIGH (ref 6–20)
CO2: 25 mmol/L (ref 22–32)
Calcium: 8.9 mg/dL (ref 8.9–10.3)
Chloride: 98 mmol/L — ABNORMAL LOW (ref 101–111)
Creatinine, Ser: 1.4 mg/dL — ABNORMAL HIGH (ref 0.61–1.24)
GFR calc Af Amer: >60 mL/min (ref >60)
GFR calc non Af Amer: 54 mL/min — ABNORMAL LOW (ref >60)
Glucose, Bld: 144 mg/dL — ABNORMAL HIGH (ref 65–99)
Potassium: 4.1 mmol/L (ref 3.5–5.1)
Sodium: 133 mmol/L — ABNORMAL LOW (ref 135–145)
Total Bilirubin: 0.9 mg/dL (ref 0.3–1.2)
Total Protein: 8.2 g/dL — ABNORMAL HIGH (ref 6.5–8.1)

## 2018-05-11 NOTE — Telephone Encounter (Signed)
Lab sheet faxed to lab

## 2018-05-11 NOTE — Telephone Encounter (Signed)
-----   Message from Charlie Pitter, [place] sent at 05/10/2018 10:43 AM EDT ----- Can you make sure they get a CBC when he returns for his CMET? Thanks. ----- Message ----- From: Janan Ridge Sent: 05/10/2018  10:33 AM To: Charlie Pitter, PA-C  [name] lab states they are unable to run the CBC and DIFF today due to inadequate specimen volume.

## 2018-05-12 ENCOUNTER — Other Ambulatory Visit (HOSPITAL_COMMUNITY)
Admission: RE | Admit: 2018-05-12 | Discharge: 2018-05-12 | Disposition: A | Discharge status: Home / Self Care | Payer: Federal, State, Local not specified - PPO | Source: Ambulatory Visit | Attending: Physician Assistant | Admitting: Physician Assistant

## 2018-05-12 DIAGNOSIS — Z79899 Other long term (current) drug therapy: Secondary | ICD-10-CM | POA: Diagnosis not present

## 2018-05-12 LAB — CBC
HCT: 34.2 % — ABNORMAL LOW (ref 39.0–52.0)
Hemoglobin: 11 g/dL — ABNORMAL LOW (ref 13.0–17.0)
MCH: 29.3 pg (ref 26.0–34.0)
MCHC: 32.2 g/dL (ref 30.0–36.0)
MCV: 91.2 fL (ref 78.0–100.0)
Platelets: 91 10*3/uL — ABNORMAL LOW (ref 150–400)
RBC: 3.75 MIL/uL — ABNORMAL LOW (ref 4.22–5.81)
RDW: 14.9 % (ref 11.5–15.5)
WBC: 4.5 10*3/uL (ref 4.0–10.5)

## 2018-05-15 ENCOUNTER — Other Ambulatory Visit: Payer: Self-pay | Admitting: Orthopedic Surgery

## 2018-05-15 DIAGNOSIS — G8929 Other chronic pain: Secondary | ICD-10-CM

## 2018-05-15 DIAGNOSIS — M25561 Pain in right knee: Principal | ICD-10-CM

## 2018-05-16 NOTE — Telephone Encounter (Signed)
I have advised patient of this, and told him to touch base with his primary care to let us know if he is acceptable risk for surgery.  He voiced understanding.

## 2018-05-29 ENCOUNTER — Telehealth: Payer: Self-pay | Admitting: Orthopedic Surgery

## 2018-05-29 DIAGNOSIS — M25561 Pain in right knee: Principal | ICD-10-CM

## 2018-05-29 DIAGNOSIS — G8929 Other chronic pain: Secondary | ICD-10-CM

## 2018-05-29 NOTE — Telephone Encounter (Signed)
Dr Aline Brochure states : Send him back for a formal Dr. preop clearance or were not doing the surgery  We do not have anything from his PCP yet about clearance

## 2018-05-29 NOTE — Telephone Encounter (Signed)
Patient has not called his PCP about clearance, as I have previously advised him to, and I have sent another letter there to advise we need clearance. I have advised him again we need a letter of clearance, either he needs to see PCP about this or call there to have them send [place] a letter  He states Ultracet not helping as much as it was previously

## 2018-05-29 NOTE — Telephone Encounter (Signed)
Patient is asking to speak with someone about his surgery. He is also asking if he can get something for pain.   PATIENT USES [facility] ON [address]  Please call and advise

## 2018-05-30 ENCOUNTER — Other Ambulatory Visit: Payer: Self-pay | Admitting: Orthopedic Surgery

## 2018-05-30 DIAGNOSIS — Z96652 Presence of left artificial knee joint: Secondary | ICD-10-CM

## 2018-05-30 DIAGNOSIS — Z4889 Encounter for other specified surgical aftercare: Secondary | ICD-10-CM

## 2018-05-30 NOTE — Telephone Encounter (Signed)
Ok waiti for clearance

## 2018-05-30 NOTE — Telephone Encounter (Signed)
Cant do other pain meds within 30 days of surgery

## 2018-05-30 NOTE — Telephone Encounter (Signed)
So, will you refill the Ultracet?

## 2018-05-30 NOTE — Telephone Encounter (Signed)
I have gotten a hand written note on an office visit placed on your desk from [facility], patient cleared for surgery needs tight glucose control after surgery. His A1C is 9.8.  Do you want to schedule? If so where?  Will you address the pain med refill request also? He states Ultracet not helping much any longer.

## 2018-05-30 NOTE — Telephone Encounter (Signed)
Ok / he is asking for pain meds, states Ultracet not helping a lot anymore

## 2018-05-30 NOTE — Telephone Encounter (Signed)
9.8 too high has to be 7.0 or less  Lett dr Collene Mares and patient know please

## 2018-05-31 ENCOUNTER — Ambulatory Visit: Payer: Federal, State, Local not specified - PPO | Admitting: Student

## 2018-05-31 ENCOUNTER — Other Ambulatory Visit: Payer: Self-pay | Admitting: Orthopedic Surgery

## 2018-05-31 DIAGNOSIS — M25561 Pain in right knee: Principal | ICD-10-CM

## 2018-05-31 DIAGNOSIS — G8929 Other chronic pain: Secondary | ICD-10-CM

## 2018-05-31 MED ORDER — TRAMADOL-ACETAMINOPHEN 37.5-325 MG PO TABS: 1.0000 | ORAL_TABLET | ORAL | 0 refills | Status: DC | PRN

## 2018-06-06 DIAGNOSIS — Z1389 Encounter for screening for other disorder: Secondary | ICD-10-CM | POA: Diagnosis not present

## 2018-06-06 DIAGNOSIS — Z23 Encounter for immunization: Secondary | ICD-10-CM | POA: Diagnosis not present

## 2018-06-06 DIAGNOSIS — N182 Chronic kidney disease, stage 2 (mild): Secondary | ICD-10-CM | POA: Diagnosis not present

## 2018-06-06 DIAGNOSIS — J22 Unspecified acute lower respiratory infection: Secondary | ICD-10-CM | POA: Diagnosis not present

## 2018-06-06 DIAGNOSIS — Z6839 Body mass index (BMI) 39.0-39.9, adult: Secondary | ICD-10-CM | POA: Diagnosis not present

## 2018-06-06 NOTE — Progress Notes (Signed)
Cardiology Office Note    Date:  06/07/2018   ID:  [name], Christopher 07-29-1961, MRN [id number]  PCP:  [name], MD  Cardiologist: Rozann Lesches, MD    Chief Complaint  Patient presents with  . Follow-up    4 week visit    History of Present Illness:    [name] is a 57 y.o. male with past medical history of CAD (s/p CABG in 08/2015), HTN, HLD, Stage 3 CKD, and OSA who presents to the office today for 4-week follow-up.   He was last examined by Melina Copa, PA-C on 05/04/2018 and reported worsening lower extremity edema over the past several weeks. Labs were checked and BNP was within normal limits but his creatinine was elevated to 1.59 (previously 1.1 - 1.2) and LFT's were slightly elevated with AST at 98 and ALT 29. His Lasix had been increased from 20mg  daily to 40mg  daily at the time of his visit and it was recommended he only take extra dosing for 2 days, then reduce back to 20mg  daily. Was also recommended to stop Benicar and Mobic at that time. A repeat echo was obtained on 5/29 and showed a preserved EF of 55-60% with no regional WMA, low-normal RV function, and mild AS.   In talking with the patient today, he reports significant improvement in his lower extremity edema since his last office visit. He has experienced a 6 pound weight loss on our office scale since then. Denies any recent dyspnea on exertion, orthopnea, PND, chest discomfort, or palpitations. He is not overly active at baseline due to bilateral knee pain.  He reports good compliance with his medication regimen and says he has been on both Lasix 20 mg daily and HCTZ 12.5 mg daily for several years  Reports when HCTZ was discontinued previously, he experienced worsening fluid retention despite Lasix titration.    Past Medical History:  Diagnosis Date  . Arthritis   . CAD (coronary artery disease)    Multivessel disease status post CABG 08/2015  . CKD (chronic kidney disease), stage II     . Essential hypertension   . Hyperlipidemia   . Hypertension   . OSA on CPAP   . Type 2 diabetes mellitus ([place])     Past Surgical History:  Procedure Laterality Date  . APPENDECTOMY    . Biceps tendon surgery Right   . CARDIAC CATHETERIZATION N/A 08/25/2015   Procedure: Left Heart Cath and Coronary Angiography;  Surgeon: Belva Crome, MD;  Location: [facility];  Service: Cardiovascular;  Laterality: N/A;  . CORONARY ARTERY BYPASS GRAFT N/A 08/29/2015   Procedure: CORONARY ARTERY BYPASS GRAFTING (CABG);  Surgeon: [name], MD;  Location: [facility];  Service: Open Heart Surgery;  Laterality: N/A;  . KNEE ARTHROSCOPY Left   . TEE WITHOUT CARDIOVERSION N/A 08/29/2015   Procedure: TRANSESOPHAGEAL ECHOCARDIOGRAM (TEE);  Surgeon: [name], MD;  Location: [facility];  Service: Open Heart Surgery;  Laterality: N/A;  . TOTAL KNEE ARTHROPLASTY Left 03/09/2017   Procedure: TOTAL KNEE ARTHROPLASTY;  Surgeon: Carole Civil, MD;  Location: [facility];  Service: Orthopedics;  Laterality: Left;    Current Medications: Outpatient Medications Prior to Visit  Medication Sig Dispense Refill  . aspirin EC 81 MG tablet Take 81 mg by mouth daily.    . diphenhydrAMINE (BENADRYL) 50 MG tablet Take 1 tablet (50 mg total) by mouth at bedtime as needed for itching. 30 tablet 0  . furosemide (  LASIX) 20 MG tablet Take 1 tablet (20 mg total) by mouth daily. 90 tablet 3  . hydrochlorothiazide (HYDRODIURIL) 25 MG tablet TAKE 1/2 TABLET PO DAILY  3  . HYDROcodone-acetaminophen (NORCO/VICODIN) 5-325 MG tablet Take 1 tablet by mouth every 6 (six) hours as needed for moderate pain.    [name] insulin aspart (NOVOLOG) 100 UNIT/ML injection Inject into the skin 3 (three) times daily before meals.    . insulin lispro protamine-lispro (HUMALOG 75/25 MIX) (75-25) 100 UNIT/ML SUSP injection Inject 70 Units into the skin 2 (two) times daily with a meal.     . KOMBIGLYZE XR 2.04-999 MG TB24 Take 1 tablet by mouth  2 (two) times daily.     [name] loratadine (CLARITIN) 10 MG tablet Take 10 mg by mouth daily.    . methocarbamol (ROBAXIN) 500 MG tablet TAKE 1 TABLET(500 MG) BY MOUTH THREE TIMES DAILY 56 tablet 0  . metoprolol tartrate (LOPRESSOR) 25 MG tablet TAKE 1/2 TABLET(12.5 MG) BY MOUTH TWICE DAILY 90 tablet 0  . pravastatin (PRAVACHOL) 20 MG tablet TAKE 1 TABLET(20 MG) BY MOUTH AT BEDTIME 90 tablet 3  . traMADol-acetaminophen (ULTRACET) 37.5-325 MG tablet Take 1 tablet by mouth every 4 (four) hours as needed. for pain 60 tablet 0  . TRESIBA FLEXTOUCH 200 UNIT/ML SOPN     . TURMERIC CURCUMIN PO Take 1 tablet by mouth daily.     . meloxicam (MOBIC) 15 MG tablet Take 1 tablet (15 mg total) by mouth daily. 30 tablet 5   No facility-administered medications prior to visit.      Allergies:   Patient has no known allergies.   Social History   Socioeconomic History  . Marital status: Married    Spouse name: Not on file  . Number of children: Not on file  . Years of education: college  . Highest education level: Not on file  Occupational History  . Occupation: Magazine features editor: [employer]  Social Needs  . Financial resource strain: Not on file  . Food insecurity:    Worry: Not on file    Inability: Not on file  . Transportation needs:    Medical: Not on file    Non-medical: Not on file  Tobacco Use  . Smoking status: Never Smoker  . Smokeless tobacco: Never Used  Substance and Sexual Activity  . Alcohol use: No    Alcohol/week: 0.0 oz  . Drug use: No  . Sexual activity: Yes    Birth control/protection: None  Lifestyle  . Physical activity:    Days per week: Not on file    Minutes per session: Not on file  . Stress: Not on file  Relationships  . Social connections:    Talks on phone: Not on file    Gets together: Not on file    Attends religious service: Not on file    Active member of club or organization: Not on file    Attends meetings of clubs or organizations: Not  on file    Relationship status: Not on file  Other Topics Concern  . Not on file  Social History Narrative  . Not on file     Family History:  The patient's family history includes Arthritis in his unknown relative; CAD in his brother and father; Cancer in his unknown relative; Diabetes in his unknown relative; Diabetes Mellitus II in his brother and father.   Review of Systems:   Please see the history of present illness.  General:  No chills, fever, night sweats or weight changes. Positive for bilateral knee pain.  Cardiovascular:  No chest pain, dyspnea on exertion, orthopnea, palpitations, paroxysmal nocturnal dyspnea. Positive for edema (improved).  Dermatological: No rash, lesions/masses Respiratory: No cough, dyspnea Urologic: No hematuria, dysuria Abdominal:   No nausea, vomiting, diarrhea, bright red blood per rectum, melena, or hematemesis Neurologic:  No visual changes, wkns, changes in mental status. All other systems reviewed and are otherwise negative except as noted above.   Physical Exam:    VS:  BP 128/76   Pulse 86   Ht 5' 10.5" (1.791 m)   Wt 268 lb (121.6 kg)   SpO2 96%   BMI 37.91 kg/m    General: Well developed, overweight Caucasian male appearing in no acute distress. Head: Normocephalic, atraumatic, sclera non-icteric, no xanthomas, nares are without discharge.  Neck: No carotid bruits. JVD not elevated.  Lungs: Respirations regular and unlabored, without wheezes or rales.  Heart: Regular rate and rhythm. No S3 or S4.  No murmur, no rubs, or gallops appreciated. Abdomen: Soft, non-tender, non-distended with normoactive bowel sounds. No hepatomegaly. No rebound/guarding. No obvious abdominal masses. Msk:  Strength and tone appear normal for age. No joint deformities or effusions. Extremities: No clubbing or cyanosis. Trace ankle edema.  Distal pedal pulses are 2+ bilaterally. Neuro: Alert and oriented X 3. Moves all extremities spontaneously. No focal  deficits noted. Psych:  Responds to questions appropriately with a normal affect. Skin: No rashes or lesions noted  Wt Readings from Last 3 Encounters:  06/07/18 268 lb (121.6 kg)  05/04/18 274 lb (124.3 kg)  05/01/18 283 lb (128.4 kg)     Studies/Labs Reviewed:   EKG:  EKG is not ordered today.   Recent Labs: 05/04/2018: B Natriuretic Peptide 29.0; TSH 2.420 05/11/2018: ALT 14 05/12/2018: Hemoglobin 11.0; Platelets 91 06/07/2018: BUN 22; Creatinine, Ser 1.38; Potassium 3.9; Sodium 134   Lipid Panel    Component Value Date/Time   CHOL 177 05/04/2018 1605   TRIG 190 (H) 05/04/2018 1605   HDL 29 (L) 05/04/2018 1605   CHOLHDL 6.1 05/04/2018 1605   VLDL 38 05/04/2018 1605   LDLCALC 110 (H) 05/04/2018 1605    Additional studies/ records that were reviewed today include:   Echocardiogram: 05/10/2018 Study Conclusions  - Left ventricle: The cavity size was normal. Wall thickness was   increased in a pattern of mild LVH. Systolic function was normal.   The estimated ejection fraction was in the range of 55% to 60%.   Wall motion was normal; there were no regional wall motion   abnormalities. Left ventricular diastolic function parameters   were normal for the patient&'s age. - Aortic valve: Moderately calcified annulus. Trileaflet; mildly   calcified leaflets. There was mild stenosis. Mean gradient (S):   10 mm Hg. Peak gradient (S): 18 mm Hg. Valve area (VTI): 1.68   cm^2. - Mitral valve: Mildly calcified annulus. There was trivial   regurgitation. - Right ventricle: Systolic function was low normal. - Right atrium: Central venous pressure (est): 3 mm Hg. - Atrial septum: No defect or patent foramen ovale was identified. - Tricuspid valve: There was trivial regurgitation. - Pulmonary arteries: PA peak pressure: 9 mm Hg (S). - Pericardium, extracardiac: There was no pericardial effusion.  Assessment:    1. Coronary artery disease involving native coronary artery of  native heart without angina pectoris   2. Abnormality of right ventricle of heart   3. Lower extremity edema  4. Essential hypertension   5. Mixed hyperlipidemia   6. CKD (chronic kidney disease) stage 3, GFR 30-59 ml/min (HCC)   7. Medication management      Plan:   In order of problems listed above:  1. CAD - he is s/p CABG in 08/2015 and recent echocardiogram showed a preserved EF of 55-60% with no regional WMA. He denies any recent chest pain or dyspnea on exertion. No indication for further ischemic testing at this time.  - continue ASA, BB, and statin therapy.   2. Abnormal RV Function - recently noted to have low-normal RV function on his echocardiogram last month. This was thought to be secondary to obesity and OSA. Continued compliance with CPAP and dietary modifications reviewed with the patient today. He has lost over 30 lbs in the past 6 months and was congratulated on this with continued weight loss advised.   3. Lower Extremity Edema - this has significantly improved since his last office visit. He has an interesting baseline medication regimen as he has remained on both Lasix 20mg  daily and HCTZ 12.5mg  daily for several years as he experienced worsening symptoms with the cessation of HCTZ and further titration of Lasix in the past. Will recheck a BMET today. Sodium and fluid restriction reviewed (he was previously consuming 8-10 bottles of water per day and has reduced this to 5-6).   4. HTN - BP is well-controlled at 128/76 during today's visit.  - continue current medication regimen.   5. HLD - Recent FLP showed total cholesterol of 177, HDL 29, and LDL 110. Not at goal of LDL < 70 and he was recently referred to the Lipid Clinic by Melina Copa, PA-C at the time of his last office visit for consideration of PCSK9 inhibitor therapy as LFT's were previously elevated (improved by most recent labs with AST at 63 and ALT 14).  6. Stage 3 CKD - baseline creatinine 1.2 -  1.3. Elevated to 1.59 when checked on 5/23 and improved to 1.40 on 05/11/2018 following the cessation of NSAIDS and Benicar. Will recheck BMET today. Would consider resuming Benicar at a lower dose in the future once kidney function stabilizes as he also has IDDM and this would be beneficial long-term.    Medication Adjustments/Labs and Tests Ordered: Current medicines are reviewed at length with the patient today.  Concerns regarding medicines are outlined above.  Medication changes, Labs and Tests ordered today are listed in the Patient Instructions below. Patient Instructions  Medication Instructions:  Your physician recommends that you continue on your current medications as directed. Please refer to the Current Medication list given to you today.   Labwork: Your physician recommends that you return for lab work today.    Testing/Procedures: NONE   Follow-Up: Your physician wants you to follow-up in: 6 Months. You will receive a reminder letter in the mail two months in advance. If you don't receive a letter, please call our office to schedule the follow-up appointment.  Any Other Special Instructions Will Be Listed Below (If Applicable).  If you need a refill on your cardiac medications before your next appointment, please call your pharmacy. Thank you for choosing [facility]!    Signed, Erma Heritage, PA-C  06/07/2018 7:34 PM    [facility] [place] [address] [place],  [address] Phone: [phone]

## 2018-06-07 ENCOUNTER — Other Ambulatory Visit (HOSPITAL_COMMUNITY)
Admission: RE | Admit: 2018-06-07 | Discharge: 2018-06-07 | Disposition: A | Discharge status: Home / Self Care | Payer: Federal, State, Local not specified - PPO | Source: Ambulatory Visit | Attending: Student | Admitting: Student

## 2018-06-07 ENCOUNTER — Encounter: Payer: Self-pay | Admitting: *Deleted

## 2018-06-07 ENCOUNTER — Ambulatory Visit: Payer: Federal, State, Local not specified - PPO | Admitting: Student

## 2018-06-07 VITALS — BP 128/76 | HR 86 | Ht 70.5 in | Wt 268.0 lb

## 2018-06-07 DIAGNOSIS — Z79899 Other long term (current) drug therapy: Secondary | ICD-10-CM | POA: Insufficient documentation

## 2018-06-07 DIAGNOSIS — N183 Chronic kidney disease, stage 3 unspecified: Secondary | ICD-10-CM

## 2018-06-07 DIAGNOSIS — I10 Essential (primary) hypertension: Secondary | ICD-10-CM | POA: Diagnosis not present

## 2018-06-07 DIAGNOSIS — R600 Localized edema: Secondary | ICD-10-CM | POA: Diagnosis not present

## 2018-06-07 DIAGNOSIS — I2510 Atherosclerotic heart disease of native coronary artery without angina pectoris: Secondary | ICD-10-CM | POA: Diagnosis not present

## 2018-06-07 DIAGNOSIS — Q208 Other congenital malformations of cardiac chambers and connections: Secondary | ICD-10-CM

## 2018-06-07 DIAGNOSIS — E782 Mixed hyperlipidemia: Secondary | ICD-10-CM | POA: Diagnosis not present

## 2018-06-07 LAB — BASIC METABOLIC PANEL
Anion gap: 12 (ref 5–15)
BUN: 22 mg/dL — ABNORMAL HIGH (ref 6–20)
CO2: 22 mmol/L (ref 22–32)
Calcium: 9.1 mg/dL (ref 8.9–10.3)
Chloride: 100 mmol/L (ref 98–111)
Creatinine, Ser: 1.38 mg/dL — ABNORMAL HIGH (ref 0.61–1.24)
GFR calc Af Amer: >60 mL/min (ref >60)
GFR calc non Af Amer: 55 mL/min — ABNORMAL LOW (ref >60)
Glucose, Bld: 216 mg/dL — ABNORMAL HIGH (ref 70–99)
Potassium: 3.9 mmol/L (ref 3.5–5.1)
Sodium: 134 mmol/L — ABNORMAL LOW (ref 135–145)

## 2018-06-07 MED ORDER — PANTOPRAZOLE SODIUM 40 MG PO TBEC: 40.0000 mg | DELAYED_RELEASE_TABLET | Freq: Every day | ORAL | 0 refills | Status: DC

## 2018-06-07 NOTE — Patient Instructions (Signed)
Medication Instructions:  Your physician recommends that you continue on your current medications as directed. Please refer to the Current Medication list given to you today.   Labwork: Your physician recommends that you return for lab work today.    Testing/Procedures: NONE   Follow-Up: Your physician wants you to follow-up in: 6 Months. You will receive a reminder letter in the mail two months in advance. If you don't receive a letter, please call our office to schedule the follow-up appointment.   Any Other Special Instructions Will Be Listed Below (If Applicable).     If you need a refill on your cardiac medications before your next appointment, please call your pharmacy. Thank you for choosing [facility]!

## 2018-06-18 ENCOUNTER — Other Ambulatory Visit: Payer: Self-pay | Admitting: Orthopedic Surgery

## 2018-06-18 DIAGNOSIS — Z96652 Presence of left artificial knee joint: Secondary | ICD-10-CM

## 2018-06-18 DIAGNOSIS — Z4889 Encounter for other specified surgical aftercare: Secondary | ICD-10-CM

## 2018-06-22 ENCOUNTER — Ambulatory Visit (INDEPENDENT_AMBULATORY_CARE_PROVIDER_SITE_OTHER): Payer: Federal, State, Local not specified - PPO | Admitting: Pharmacist

## 2018-06-22 ENCOUNTER — Encounter: Payer: Self-pay | Admitting: Pharmacist

## 2018-06-22 DIAGNOSIS — E782 Mixed hyperlipidemia: Secondary | ICD-10-CM

## 2018-06-22 MED ORDER — EZETIMIBE 10 MG PO TABS: 10.0000 mg | ORAL_TABLET | Freq: Every day | ORAL | 3 refills | Status: DC

## 2018-06-22 NOTE — Patient Instructions (Addendum)
START ezetimibe 10mg  daily CONTINUE pravastatin 20mg  daily  We will check your liver function test in 3-4 weeks. Then we will check your cholesterol in 2-3 month.   Call [phone] with any questions or concerns.   Cholesterol Cholesterol is a fat. Your body needs a small amount of cholesterol. Cholesterol (plaque) may build up in your blood vessels (arteries). That makes you more likely to have a heart attack or stroke. You cannot feel your cholesterol level. Having a blood test is the only way to find out if your level is high. Keep your test results. Work with your doctor to keep your cholesterol at a good level. What do the results mean?  Total cholesterol is how much cholesterol is in your blood.  LDL is bad cholesterol. This is the type that can build up. Try to have low LDL.  HDL is good cholesterol. It cleans your blood vessels and carries LDL away. Try to have high HDL.  Triglycerides are fat that the body can store or burn for energy. What are good levels of cholesterol?  Total cholesterol below 200.  LDL below 100 is good for people who have health risks. LDL below 70 is good for people who have very high risks.  HDL above 40 is good. It is best to have HDL of 60 or higher.  Triglycerides below 150. How can I lower my cholesterol? Diet Follow your diet program as told by your doctor.  Choose fish, white meat chicken, or [place] that is roasted or baked. Try not to eat red meat, fried foods, sausage, or lunch meats.  Eat lots of fresh fruits and vegetables.  Choose whole grains, beans, pasta, potatoes, and cereals.  Choose olive oil, corn oil, or canola oil. Only use small amounts.  Try not to eat butter, mayonnaise, shortening, or palm kernel oils.  Try not to eat foods with trans fats.  Choose low-fat or nonfat dairy foods. ? Drink skim or nonfat milk. ? Eat low-fat or nonfat yogurt and cheeses. ? Try not to drink whole milk or cream. ? Try not to eat ice  cream, egg yolks, or full-fat cheeses.  Healthy desserts include angel food cake, ginger snaps, animal crackers, hard candy, popsicles, and low-fat or nonfat frozen yogurt. Try not to eat pastries, cakes, pies, and cookies.  Exercise Follow your exercise program as told by your doctor.  Be more active. Try gardening, walking, and taking the stairs.  Ask your doctor about ways that you can be more active.  Medicine  Take over-the-counter and prescription medicines only as told by your doctor. This information is not intended to replace advice given to you by your health care provider. Make sure you discuss any questions you have with your health care provider. Document Released: 02/25/2009 Document Revised: 06/30/2016 Document Reviewed: 06/10/2016 Elsevier Interactive Patient Education  [name].

## 2018-06-22 NOTE — Progress Notes (Signed)
Patient ID: [name]                 DOB: 1961-01-25                    MRN: [id number]     HPI: [name] is a 57 y.o. male patient of Dr. Domenic Polite that presents today for lipid evaluation.  PMH includes CAD (s/p CABG in 08/2015), HTN, HLD, Stage 3 CKD, and OSA. He has a history of elevated LFTs. These more recently have been trending toward normal. He has been on pravastatin 20mg  for years according to him.   He reports tolerating his pravastatin well. He has not yet seen GI for workup of elevated LFTs, though he thinks that he has been told that a referral has been entered. He believes that he has muscle aches on atorvastatin (though he is not certain). He recalls it was stopped soon after starting due to some sort of side effect.    Risk Factors: CAD s/p NSTEMI, HTN LDL Goal: <70, nonHDL <100  Current Medications: pravastatin 20mg  daily  Intolerances: atorvastatin 80mg  - (muscle aches)   Diet: Eats out and from home. He generally avoid fast foods. He eats mostly chicken and fish. He eats red meat 1-2 times per month. He does eat vegetables regularly. He avoids potatoes. Occasional pasta and bread. He drinks 1 cup of coffee per morning and water otherwise.   Exercise: He is somewhat limited due to knee replacement and shoulder pain.   Family History: CAD in his brother and father; Cancer in his unknown relative; Diabetes in his unknown relative; Diabetes Mellitus II in his brother and father.  Social History: denies tobacco and alcohol  Labs: 05/04/18:  TC 177, TG 190, HDL 29, LDL 110, nonHDL 148, AST 63, ALT 14 (pravastatin 20mg  daily)  Past Medical History:  Diagnosis Date  . Arthritis   . CAD (coronary artery disease)    Multivessel disease status post CABG 08/2015  . CKD (chronic kidney disease), stage II   . Essential hypertension   . Hyperlipidemia   . Hypertension   . OSA on CPAP   . Type 2 diabetes mellitus ([place])     Current Outpatient Medications  on File Prior to Visit  Medication Sig Dispense Refill  . aspirin EC 81 MG tablet Take 81 mg by mouth daily.    . diphenhydrAMINE (BENADRYL) 50 MG tablet Take 1 tablet (50 mg total) by mouth at bedtime as needed for itching. 30 tablet 0  . furosemide (LASIX) 20 MG tablet Take 1 tablet (20 mg total) by mouth daily. 90 tablet 3  . hydrochlorothiazide (HYDRODIURIL) 25 MG tablet TAKE 1/2 TABLET PO DAILY  3  . HYDROcodone-acetaminophen (NORCO/VICODIN) 5-325 MG tablet Take 1 tablet by mouth every 6 (six) hours as needed for moderate pain.    [name] insulin aspart (NOVOLOG) 100 UNIT/ML injection Inject into the skin 3 (three) times daily before meals.    . insulin lispro protamine-lispro (HUMALOG 75/25 MIX) (75-25) 100 UNIT/ML SUSP injection Inject 70 Units into the skin 2 (two) times daily with a meal.     . KOMBIGLYZE XR 2.04-999 MG TB24 Take 1 tablet by mouth 2 (two) times daily.     [name] loratadine (CLARITIN) 10 MG tablet Take 10 mg by mouth daily.    . methocarbamol (ROBAXIN) 500 MG tablet TAKE 1 TABLET(500 MG) BY MOUTH THREE TIMES DAILY 56 tablet 0  . metoprolol tartrate (LOPRESSOR) 25 MG  tablet TAKE 1/2 TABLET(12.5 MG) BY MOUTH TWICE DAILY 90 tablet 0  . pantoprazole (PROTONIX) 40 MG tablet Take 1 tablet (40 mg total) by mouth daily. 30 tablet 0  . pravastatin (PRAVACHOL) 20 MG tablet TAKE 1 TABLET(20 MG) BY MOUTH AT BEDTIME 90 tablet 3  . traMADol-acetaminophen (ULTRACET) 37.5-325 MG tablet Take 1 tablet by mouth every 4 (four) hours as needed. for pain 60 tablet 0  . TRESIBA FLEXTOUCH 200 UNIT/ML SOPN     . TURMERIC CURCUMIN PO Take 1 tablet by mouth daily.      No current facility-administered medications on file prior to visit.     No Known Allergies  Assessment/Plan: Hyperlipidemia: LDL not at goal. Discussed options including increasing pravastatin, ezetimibe, and PCSK9i therapy. Will defer increase in statin until after GI work up for other causes of changes in LFTs. Will start ezetimibe 10mg   daily. Will recheck LFTs in 2-3 weeks after starting ezetimibe. Will check cholesterol panel in 2-3 months.    Thank you,  Lelan Pons. Patterson Hammersmith, [facility]  06/22/2018 7:00 AM  Order placed in mail for him to have labs drawn at Lab in [place].

## 2018-07-01 ENCOUNTER — Other Ambulatory Visit: Payer: Self-pay | Admitting: Orthopedic Surgery

## 2018-07-01 DIAGNOSIS — G8929 Other chronic pain: Secondary | ICD-10-CM

## 2018-07-01 DIAGNOSIS — M25561 Pain in right knee: Principal | ICD-10-CM

## 2018-07-03 ENCOUNTER — Other Ambulatory Visit: Payer: Self-pay | Admitting: Orthopedic Surgery

## 2018-07-03 DIAGNOSIS — M25561 Pain in right knee: Principal | ICD-10-CM

## 2018-07-03 DIAGNOSIS — G8929 Other chronic pain: Secondary | ICD-10-CM

## 2018-07-03 MED ORDER — TRAMADOL-ACETAMINOPHEN 37.5-325 MG PO TABS: 1.0000 | ORAL_TABLET | ORAL | 0 refills | Status: DC | PRN

## 2018-07-10 ENCOUNTER — Other Ambulatory Visit (HOSPITAL_COMMUNITY)
Admission: RE | Admit: 2018-07-10 | Discharge: 2018-07-10 | Disposition: A | Discharge status: Home / Self Care | Payer: Federal, State, Local not specified - PPO | Source: Ambulatory Visit | Attending: Cardiology | Admitting: Cardiology

## 2018-07-10 ENCOUNTER — Other Ambulatory Visit: Payer: Self-pay | Admitting: Orthopedic Surgery

## 2018-07-10 DIAGNOSIS — Z96652 Presence of left artificial knee joint: Secondary | ICD-10-CM

## 2018-07-10 DIAGNOSIS — E782 Mixed hyperlipidemia: Secondary | ICD-10-CM | POA: Insufficient documentation

## 2018-07-10 DIAGNOSIS — Z4889 Encounter for other specified surgical aftercare: Secondary | ICD-10-CM

## 2018-07-10 LAB — HEPATIC FUNCTION PANEL
ALT: 19 U/L (ref 0–44)
AST: 58 U/L — ABNORMAL HIGH (ref 15–41)
Albumin: 3.4 g/dL — ABNORMAL LOW (ref 3.5–5.0)
Alkaline Phosphatase: 51 U/L (ref 38–126)
Bilirubin, Direct: 0.1 mg/dL (ref 0.0–0.2)
Indirect Bilirubin: 0.9 mg/dL (ref 0.3–0.9)
Total Bilirubin: 1 mg/dL (ref 0.3–1.2)
Total Protein: 7.6 g/dL (ref 6.5–8.1)

## 2018-07-11 ENCOUNTER — Telehealth: Payer: Self-pay | Admitting: *Deleted

## 2018-07-11 DIAGNOSIS — E1129 Type 2 diabetes mellitus with other diabetic kidney complication: Secondary | ICD-10-CM | POA: Diagnosis not present

## 2018-07-11 DIAGNOSIS — I10 Essential (primary) hypertension: Secondary | ICD-10-CM | POA: Diagnosis not present

## 2018-07-11 DIAGNOSIS — E1165 Type 2 diabetes mellitus with hyperglycemia: Secondary | ICD-10-CM | POA: Diagnosis not present

## 2018-07-11 DIAGNOSIS — E7800 Pure hypercholesterolemia, unspecified: Secondary | ICD-10-CM | POA: Diagnosis not present

## 2018-07-11 DIAGNOSIS — N189 Chronic kidney disease, unspecified: Secondary | ICD-10-CM | POA: Diagnosis not present

## 2018-07-11 NOTE — Telephone Encounter (Signed)
-----   Message from Satira Sark, MD sent at 07/10/2018 10:35 AM EDT ----- Results reviewed.  Chart reviewed.  Patient has been seen in the lipid clinic.  Recently placed on Zetia in addition to Pravachol.  Liver function tests are actually fairly stable, ALT is normal and his AST is only mildly increased, actually less than the last 2 recorded checks. A copy of this test should be forwarded to [name], MD.

## 2018-07-11 NOTE — Telephone Encounter (Signed)
Patient informed and copy sent to PCP. 

## 2018-07-14 DIAGNOSIS — Z1389 Encounter for screening for other disorder: Secondary | ICD-10-CM | POA: Diagnosis not present

## 2018-07-14 DIAGNOSIS — Z6839 Body mass index (BMI) 39.0-39.9, adult: Secondary | ICD-10-CM | POA: Diagnosis not present

## 2018-07-14 DIAGNOSIS — J069 Acute upper respiratory infection, unspecified: Secondary | ICD-10-CM | POA: Diagnosis not present

## 2018-07-18 ENCOUNTER — Other Ambulatory Visit: Payer: Self-pay | Admitting: Cardiology

## 2018-08-02 ENCOUNTER — Telehealth: Payer: Self-pay | Admitting: Cardiology

## 2018-08-02 NOTE — Telephone Encounter (Signed)
Per phone call from pt-- having rt leg swelling for about 3-4 days

## 2018-08-02 NOTE — Telephone Encounter (Signed)
Called pt. No answer. Left message for pt to return call.  

## 2018-08-03 NOTE — Telephone Encounter (Signed)
Called pt. No answer. Left message for pt to return call.  

## 2018-08-04 NOTE — Telephone Encounter (Signed)
Called pt. Left message for him to return call to update on symptoms.

## 2018-08-13 ENCOUNTER — Other Ambulatory Visit: Payer: Self-pay | Admitting: Orthopaedic Surgery

## 2018-08-13 ENCOUNTER — Other Ambulatory Visit: Payer: Self-pay | Admitting: Orthopedic Surgery

## 2018-08-13 DIAGNOSIS — G8929 Other chronic pain: Secondary | ICD-10-CM

## 2018-08-13 DIAGNOSIS — Z4889 Encounter for other specified surgical aftercare: Secondary | ICD-10-CM

## 2018-08-13 DIAGNOSIS — Z96652 Presence of left artificial knee joint: Secondary | ICD-10-CM

## 2018-08-13 DIAGNOSIS — M25561 Pain in right knee: Principal | ICD-10-CM

## 2018-08-25 DIAGNOSIS — E1165 Type 2 diabetes mellitus with hyperglycemia: Secondary | ICD-10-CM | POA: Diagnosis not present

## 2018-08-25 DIAGNOSIS — N189 Chronic kidney disease, unspecified: Secondary | ICD-10-CM | POA: Diagnosis not present

## 2018-08-25 DIAGNOSIS — E7800 Pure hypercholesterolemia, unspecified: Secondary | ICD-10-CM | POA: Diagnosis not present

## 2018-08-25 DIAGNOSIS — Z23 Encounter for immunization: Secondary | ICD-10-CM | POA: Diagnosis not present

## 2018-08-25 DIAGNOSIS — I10 Essential (primary) hypertension: Secondary | ICD-10-CM | POA: Diagnosis not present

## 2018-08-31 ENCOUNTER — Other Ambulatory Visit (HOSPITAL_COMMUNITY)
Admission: RE | Admit: 2018-08-31 | Discharge: 2018-08-31 | Disposition: A | Discharge status: Home / Self Care | Payer: Federal, State, Local not specified - PPO | Source: Ambulatory Visit | Attending: Cardiology | Admitting: Cardiology

## 2018-08-31 DIAGNOSIS — E782 Mixed hyperlipidemia: Secondary | ICD-10-CM | POA: Insufficient documentation

## 2018-08-31 DIAGNOSIS — J069 Acute upper respiratory infection, unspecified: Secondary | ICD-10-CM | POA: Diagnosis not present

## 2018-08-31 DIAGNOSIS — Z681 Body mass index (BMI) 19 or less, adult: Secondary | ICD-10-CM | POA: Diagnosis not present

## 2018-08-31 DIAGNOSIS — L853 Xerosis cutis: Secondary | ICD-10-CM | POA: Diagnosis not present

## 2018-08-31 LAB — HEPATIC FUNCTION PANEL
ALT: 21 U/L (ref 0–44)
AST: 59 U/L — ABNORMAL HIGH (ref 15–41)
Albumin: 3.4 g/dL — ABNORMAL LOW (ref 3.5–5.0)
Alkaline Phosphatase: 59 U/L (ref 38–126)
Bilirubin, Direct: 0.2 mg/dL (ref 0.0–0.2)
Indirect Bilirubin: 0.7 mg/dL (ref 0.3–0.9)
Total Bilirubin: 0.9 mg/dL (ref 0.3–1.2)
Total Protein: 7.7 g/dL (ref 6.5–8.1)

## 2018-08-31 LAB — LIPID PANEL
Cholesterol: 103 mg/dL (ref 0–200)
HDL: 30 mg/dL — ABNORMAL LOW (ref >40)
LDL Cholesterol: 36 mg/dL (ref 0–99)
Total CHOL/HDL Ratio: 3.4 RATIO
Triglycerides: 187 mg/dL — ABNORMAL HIGH (ref <150)
VLDL: 37 mg/dL (ref 0–40)

## 2018-09-04 ENCOUNTER — Other Ambulatory Visit: Payer: Self-pay | Admitting: Orthopedic Surgery

## 2018-09-04 DIAGNOSIS — Z4889 Encounter for other specified surgical aftercare: Secondary | ICD-10-CM

## 2018-09-04 DIAGNOSIS — G8929 Other chronic pain: Secondary | ICD-10-CM

## 2018-09-04 DIAGNOSIS — M25561 Pain in right knee: Secondary | ICD-10-CM

## 2018-09-04 DIAGNOSIS — Z96652 Presence of left artificial knee joint: Secondary | ICD-10-CM

## 2018-09-05 DIAGNOSIS — K080 Exfoliation of teeth due to systemic causes: Secondary | ICD-10-CM | POA: Diagnosis not present

## 2018-09-06 DIAGNOSIS — E11319 Type 2 diabetes mellitus with unspecified diabetic retinopathy without macular edema: Secondary | ICD-10-CM | POA: Diagnosis not present

## 2018-09-14 DIAGNOSIS — G4733 Obstructive sleep apnea (adult) (pediatric): Secondary | ICD-10-CM | POA: Diagnosis not present

## 2018-09-15 ENCOUNTER — Ambulatory Visit (INDEPENDENT_AMBULATORY_CARE_PROVIDER_SITE_OTHER): Payer: Federal, State, Local not specified - PPO | Admitting: Orthopedic Surgery

## 2018-09-15 ENCOUNTER — Ambulatory Visit (INDEPENDENT_AMBULATORY_CARE_PROVIDER_SITE_OTHER): Payer: Federal, State, Local not specified - PPO

## 2018-09-15 ENCOUNTER — Encounter: Payer: Self-pay | Admitting: Orthopedic Surgery

## 2018-09-15 VITALS — BP 119/69 | HR 102 | Ht 70.5 in | Wt 265.0 lb

## 2018-09-15 DIAGNOSIS — M1711 Unilateral primary osteoarthritis, right knee: Secondary | ICD-10-CM

## 2018-09-15 DIAGNOSIS — I2510 Atherosclerotic heart disease of native coronary artery without angina pectoris: Secondary | ICD-10-CM

## 2018-09-15 NOTE — Patient Instructions (Addendum)
You have decided to proceed with knee replacement surgery. You have decided not to continue with nonoperative measures such as but not limited to oral medication, weight loss, activity modification, physical therapy, bracing, or injection.  We will perform the procedure commonly known as total knee replacement. Some of the risks associated with knee replacement surgery include but are not limited to Bleeding Infection Swelling Stiffness Blood clot Pain that persists even after surgery  Infection is especially devastating complication of knee surgery although rare. If infection does occur your implant will usually have to be removed and several surgeries and antibiotics will be needed to eradicate the infection prior to performing a repeat replacement.   In some cases amputation is required to eradicate the infection. In other rare cases a knee fusion is needed   In compliance with recent [place] law in federal regulation regarding opioid use and abuse and addiction, we will taper (stop) opioid medication after 6 weeks.  If you're not comfortable with these risks and would like to continue with nonoperative treatment please let Dr. Aline Brochure know prior to your surgery.   Preparing for Knee Replacement Getting prepared before knee replacement surgery can make your recovery easier and more comfortable. This document provides some tips and guidelines that will help you prepare for your surgery. You can ease any concerns about your financial responsibilities by calling your insurance company after you decide to have surgery. In addition to asking about your surgery and hospital stay, you will want to ask about coverage for medical equipment, rehabilitation facilities, and home care. How should I arrange for help? You will be stronger and more mobile every day. However, in the first couple of weeks after surgery, it is unlikely that you will be able to do all your daily activities as easily as  you did before your surgery. You may tire easily and will still have limited movement in your leg. Follow these guidelines to best arrange for the help you may need after your surgery:  Plan to have someone take you home after the procedure. Your health care provider will tell you how many days you can expect to be in the hospital.  Cancel all work, caregiving, and volunteer responsibilities for at least 4-6 weeks after your surgery.  If you live alone, arrange for someone to take care of your home and pets for the first 4-6 weeks after surgery.  Plan to have someone stay with you day and night for the first week. This person should be someone you are comfortable with. You may need this person to help you with your exercises and personal care, such as bathing and using the toilet.  Arrange for drivers to take you to and from your follow-up appointments, the grocery store, and other places you may need to go for at least 4-6 weeks.  How should I prepare my home?  Pick a recovery spot, but do not plan on recovering in bed. Sitting upright is better for your health. You may want to use a recliner with a small table nearby. Place the items you use most frequently on that table. These may include the TV remote, a cordless phone, a cell phone, a book or laptop computer, a water glass, and any other items of your choice.  Remove all clutter from your floors. Also remove any throw rugs.  To see if you will be able to move in your home with a walker, hold your hands out about 6 inches (15 cm) from  your sides. Walk from your recovery spot to your kitchen and bathroom. Then walk from your bed to the bathroom. If you do not hit anything with your hands, you'll know that you have enough room for a walker.  Move the items that you use most often from your kitchen, bathroom, and bedroom to shelves and drawers that are at countertop height.  Prepare a few meals to freeze and reheat later.  Consider adding  grab bars in the shower and near the toilet.  While you are in the hospital, you will learn about equipment that can be helpful for your recovery. Equipment may include raised toilet seats, tub benches, and shower benches. How should I prepare my body?  Have a preoperative exam. ? During the exam, your health care provider will make sure that your body is healthy enough to safely have this surgery. ? To your exam, take a complete list of all your medicines and supplements, including herbs and vitamins. ? You may need to have additional tests to ensure your safety.  Have elective dental care and routine cleanings completed before your surgery. Germs from anywhere in your body, including your mouth, can travel to your new joint and infect it. It is important that you do not have any dental work performed for at least 3 months after your surgery. After surgery, be sure to tell your dentist about your joint replacement.  Maintain a healthy diet. Do not change your diet before surgery unless your health care provider advised you to do that.  Do not use any products that contain nicotine or tobacco, such as cigarettes or e-cigarettes. Tobacco and nicotine can delay bone healing. If you need help quitting, ask your health care provider.  The day before your surgery, follow instructions from your health care provider for showering, eating, drinking, and taking medicines. These directions are for your safety. What kinds of exercises should I do? Your health care provider may have you do the following exercises before your surgery. Be sure to follow the exercise program only as directed by your health care provider. You should not feel pain while performing these exercises. Ankle pumps ( Dorsiflexion/plantar flexion) 1. Sit on a firm surface with your __________ leg straight out in front of you. Do not rest your foot on anything. 2. Move your foot at the ankle joint to tilt the top of your foot toward  your shin. 3. Hold this position for __________ seconds. 4. Reverse the motion by tilting the top of your foot away from your shin. 5. Hold this position for __________ seconds. Repeat __________ times, then do the exercise with your other leg. Complete this exercise __________ times a day. Heel slides ( Knee flexion) 1. Lie on your back with both knees straight. (If this causes back discomfort, bend your healthy knee so your foot is flat on the floor. Keep this leg in this position while doing heel slides with the other leg.) 2. Slowly slide your __________ heel back toward your buttocks until you feel a gentle stretch in the front of your knee or thigh. 3. Hold this position for __________ seconds. 4. Slowly slide your __________ heel to the starting position. Repeat __________ times, then do the exercise with your other leg. Complete this exercise __________ times a day. Quadriceps, isometric 1. Lie on your back with your __________ leg extended and your other knee bent. 2. Gradually tighten the muscles in the front of your __________ thigh. This motion will push the back  of your knee down toward the surface that is under it. 3. For __________ seconds, keep the muscles as tight as you can without causing or increasing pain. 4. Relax the muscles slowly and completely after each repetition. Repeat __________ times, then do the exercise with your other leg. Complete this exercise __________ times a day. Short arc kicks 1. Lie on your back. Place a rolled towel (about 4-6 inches [10-15 cm] in diameter) under one knee so the knee is slightly bent. 2. Tighten the muscles in the front of your __________ thigh to raise only the lower part of your slightly bent leg off the floor. Do not allow your thigh to rise. 3. Hold this position for __________ seconds. Repeat __________ times, then do the exercise with your other leg. Complete this exercise __________ times a day. Straight leg raises -  quadriceps 1. Lie on your back with your __________ leg extended and your other knee bent. 2. Tighten the muscles in the front of your __________ thigh. Your thigh may shake slightly. 3. Tighten these muscles even more to raise your leg 4-6 inches (10-15 cm) off the floor. 4. Hold this position for __________ seconds. 5. Keep these muscles tight as you lower your leg. 6. Relax the muscles slowly and completely after each repetition. Repeat __________ times, then do the exercise with your other leg. Complete this exercise __________ times a day. Hamstring curls, prone If told by your health care provider, do this exercise while wearing ankle weights. Begin with __________ weights. Then increase the weight by 1 lb (0.5 kg) increments. Do not wear ankle weights that are more than __________. 1. Lie on your abdomen with your legs straight. [place] your __________ knee as far as you can comfortably do that. Keep your hips flat against the surface that is under them. When you bend your knee, bring your foot straight toward your buttock. Do not let it fall in or out. 3. Hold this position for __________ seconds. 4. Slowly lower your leg to the starting position.  Repeat __________ times, then do the exercise with your other leg. Complete this exercise __________ times a day. Armchair push-ups 1. Find a firm, non-wheeled chair that has solid armrests. 2. Sitting in the chair, extend your __________ leg straight out in front of you. 3. Use your arms and your other leg to lift your body weight off of the seat of the chair. 4. Slowly lower your body weight to the seat. Repeat __________ times, then do the exercise with your other leg. Complete this exercise __________ times a day. This information is not intended to replace advice given to you by your health care provider. Make sure you discuss any questions you have with your health care provider. Document Released: 03/05/2011 Document Revised:  01/01/2017 Document Reviewed: 02/21/2014 Elsevier Interactive Patient Education  2018 [employer].  Total Knee Replacement Total knee replacement is a surgery to replace your knee joint with a man-made (prosthetic) joint. The man-made joint is called a prosthesis. It replaces parts of the thigh bone (femur), lower leg bone (tibia), and kneecap (patella). This surgery is done to lessen pain and improve knee movement. What happens before the procedure?  Ask your doctor about: ? Changing or stopping your normal medicines. This is especially important if you take diabetes medicines or blood thinners. ? Taking medicines such as aspirin and ibuprofen. These medicines can thin your blood. Do not take these medicines before your procedure if your doctor tells you not  to.  Get all dental care that you need done before your procedure. Plan to not have dental work done for 3 months after your surgery.  Follow instructions from your doctor about what you cannot eat or drink.  Ask your doctor how your surgical site will be marked or identified.  You may be given antibiotic medicine to help prevent infection.  If your doctor prescribes physical therapy, do exercises as told.  Do not use any tobacco products, such as cigarettes, chewing tobacco, or e-cigarettes. If you need help quitting, ask your doctor.  You may have a physical exam.  You may have tests, such as: ? X-rays. ? MRI. ? CT scan. ? Bone scans.  You may have a blood or urine sample taken.  Plan to have someone take you home after the procedure.  If you will be going home right after the procedure, plan to have someone with you for at least 24 hours. It is best to have someone help care for you for at least 4-6 weeks after surgery. What happens during the procedure?  To reduce your risk of infection: ? Your health care team will wash or sanitize their hands. ? Your skin will be washed with soap.  An IV tube will be put into  one of your veins.  You will be given one or more of the following: ? Sedative. This is a medicine that makes you relaxed. ? Local anesthetic. This is a medicine to numb the area. ? General anesthetic. This is a medicine that makes you fall asleep. ? Spinal anesthetic. This is a medicine that numbs your body below the waist. ? Regional anesthetic. This is a medicine that numbs everything below the injection site.  A cut (incision) will be made in your knee.  Damaged parts of your thigh bone, lower leg bone, and kneecap will be removed.  A piece of metal (liner) will be placed on your thigh bone. Pieces of plastic will be placed on your lower leg bone and the underside of your kneecap.  One or more small tubes (drains) may be placed near your cut to help drain fluid.  Your cut will be closed with stitches (sutures), skin glue, or skin tape (adhesive) strips. Medicine may be put on your cut.  A bandage (dressing) will be placed over your cut. The procedure may vary among doctors and hospitals. What happens after the procedure?  Your blood pressure, heart rate, breathing rate, and blood oxygen level will be monitored often until the medicines you were given have worn off.  You may continue to get fluids and medicines through an IV tube.  You will have some pain. There will be medicines to help you.  You may have fluid coming from a drain.  You may have to wear special socks (compression stockings). These help to prevent blood clots and reduce swelling in your legs.  You will be told to move around as much as possible.  You may be given a continuous passive motion machine to use at home. You will be shown how to use this machine.  Do not drive for 24 hours if you received a sedative. This information is not intended to replace advice given to you by your health care provider. Make sure you discuss any questions you have with your health care provider. Document Released: 02/21/2012  Document Revised: 08/02/2016 Document Reviewed: 11/05/2015 Elsevier Interactive Patient Education  2017 [employer].

## 2018-09-15 NOTE — Progress Notes (Signed)
[facility]  Chief Complaint  Patient presents with  . Knee Pain    Right knee wants replacement    57 year old male with history of coronary artery disease had a CABG in 2016 has hypertension diabetes hemoglobin A1c is 6.8 BMI is 37.5 presents for preop for right total knee arthroplasty.  His left arthroplasty went well.  HE C/O DISABLING DULL ACHING RIGHT KNEE PAIN ALONG THE MEDIAL COMPARTMENT WITH STIFFNESS AND INTERMITTENT SWELLING.  He has had pain for several years he has had an injection 2 weeks had oral medication including tramadol and NSAIDs.  His activities of daily living are difficult such as getting up sitting down walking going up and down steps   Review of Systems  Constitutional: Positive for weight loss. Negative for chills, diaphoresis, fever and malaise/fatigue.  Respiratory: Negative for shortness of breath.   Cardiovascular: Negative for chest pain.  Musculoskeletal: Negative for falls.  Neurological: Negative for tingling, sensory change, focal weakness and weakness.  All other systems reviewed and are negative.    Past Medical History:  Diagnosis Date  . Arthritis   . CAD (coronary artery disease)    Multivessel disease status post CABG 08/2015  . CKD (chronic kidney disease), stage II   . Essential hypertension   . Hyperlipidemia   . Hypertension   . OSA on CPAP   . Type 2 diabetes mellitus ([place])     Past Surgical History:  Procedure Laterality Date  . APPENDECTOMY    . Biceps tendon surgery Right   . CARDIAC CATHETERIZATION N/A 08/25/2015   Procedure: Left Heart Cath and Coronary Angiography;  Surgeon: Belva Crome, MD;  Location: [facility];  Service: Cardiovascular;  Laterality: N/A;  . CORONARY ARTERY BYPASS GRAFT N/A 08/29/2015   Procedure: CORONARY ARTERY BYPASS GRAFTING (CABG);  Surgeon: [name], MD;  Location: [facility];  Service: Open Heart Surgery;  Laterality: N/A;  . KNEE ARTHROSCOPY Left   . TEE WITHOUT  CARDIOVERSION N/A 08/29/2015   Procedure: TRANSESOPHAGEAL ECHOCARDIOGRAM (TEE);  Surgeon: [name], MD;  Location: [facility];  Service: Open Heart Surgery;  Laterality: N/A;  . TOTAL KNEE ARTHROPLASTY Left 03/09/2017   Procedure: TOTAL KNEE ARTHROPLASTY;  Surgeon: Carole Civil, MD;  Location: [facility];  Service: Orthopedics;  Laterality: Left;    Family History  Problem Relation Age of Onset  . Arthritis Unknown   . Cancer Unknown   . Diabetes Unknown   . CAD Father   . Diabetes Mellitus II Father   . CAD Brother   . Diabetes Mellitus II Brother   . Anesthesia problems Neg Hx   . Hypotension Neg Hx   . Malignant hyperthermia Neg Hx   . Pseudochol deficiency Neg Hx    Social History   Tobacco Use  . Smoking status: Never Smoker  . Smokeless tobacco: Never Used  Substance Use Topics  . Alcohol use: No    Alcohol/week: 0.0 standard drinks  . Drug use: No    No Known Allergies   Current Meds  Medication Sig  . aspirin EC 81 MG tablet Take 81 mg by mouth daily.  . diphenhydrAMINE (BENADRYL) 50 MG tablet Take 1 tablet (50 mg total) by mouth at bedtime as needed for itching.  . ezetimibe (ZETIA) 10 MG tablet Take 1 tablet (10 mg total) by mouth daily.  . hydrochlorothiazide (HYDRODIURIL) 25 MG tablet TAKE 1/2 TABLET PO DAILY  . HYDROcodone-acetaminophen (NORCO/VICODIN) 5-325 MG tablet Take 1 tablet by  mouth every 6 (six) hours as needed for moderate pain.  [name] insulin aspart (NOVOLOG) 100 UNIT/ML injection Inject into the skin 3 (three) times daily before meals.  . insulin lispro protamine-lispro (HUMALOG 75/25 MIX) (75-25) 100 UNIT/ML SUSP injection Inject 70 Units into the skin 2 (two) times daily with a meal.   . KOMBIGLYZE XR 2.04-999 MG TB24 Take 1 tablet by mouth 2 (two) times daily.   [name] loratadine (CLARITIN) 10 MG tablet Take 10 mg by mouth daily.  . methocarbamol (ROBAXIN) 500 MG tablet TAKE 1 TABLET(500 MG) BY MOUTH THREE TIMES DAILY  . metoprolol tartrate  (LOPRESSOR) 25 MG tablet TAKE 1/2 TABLET(12.5 MG) BY MOUTH TWICE DAILY  . pravastatin (PRAVACHOL) 20 MG tablet TAKE 1 TABLET(20 MG) BY MOUTH AT BEDTIME  . traMADol-acetaminophen (ULTRACET) 37.5-325 MG tablet Take 1 tablet by mouth every 4 (four) hours as needed. for pain  . TRESIBA FLEXTOUCH 200 UNIT/ML SOPN 2 (two) times daily.   . TURMERIC CURCUMIN PO Take 1 tablet by mouth daily.     BP 119/69   Pulse (!) 102   Ht 5' 10.5" (1.791 m)   Wt 265 lb (120.2 kg)   BMI 37.49 kg/m   Physical Exam  Constitutional: He is oriented to person, place, and time. He appears well-developed and well-nourished. No distress.  HENT:  Head: Normocephalic and atraumatic.  Right Ear: External ear normal.  Left Ear: External ear normal.  Nose: Nose normal.  Eyes: Pupils are equal, round, and reactive to light. Conjunctivae and EOM are normal. Right eye exhibits no discharge. Left eye exhibits no discharge.  Neck: Normal range of motion. Neck supple. No tracheal deviation present. No thyromegaly present.  Cardiovascular: Normal rate, regular rhythm and intact distal pulses.  Pulmonary/Chest: Effort normal. No stridor. No respiratory distress. He has no wheezes. He exhibits no tenderness.  Abdominal: Soft. He exhibits no distension and no mass. There is no guarding.  Lymphadenopathy:    He has no cervical adenopathy.  Neurological: He is alert and oriented to person, place, and time. He has normal reflexes. Gait normal.  Skin: Skin is warm and dry. Capillary refill takes less than 2 seconds. He is not diaphoretic.  Psychiatric: He has a normal mood and affect. His behavior is normal. Judgment and thought content normal.    Ortho Exam   Right and left upper extremities no clubbing cyanosis or edema alignment is normal full range of motion without contracture subluxation atrophy or tremor  Left knee prior knee replacement incision healed nicely his knee comes to near full extension he regained 110 degrees  of flexion the knee is stable is no tenderness or swelling joint is reduced without subluxation or laxity neurovascular exam is intact  Right knee flexion 94 degrees 5 degree flexion contracture no instability tenderness primarily in the medial joint line varus alignment is noted in the knee no effusion distal neurovascular function is intact skin looks good  MEDICAL DECISION SECTION  xrays ordered?  Yes [facility]  My independent reading of xrays: See report summation varus arthritis severe patellofemoral arthritis severe     Encounter Diagnosis  Name Primary?  . Primary osteoarthritis of right knee Yes     PLAN:   Surgical procedure planned: RIGHT TKA   The procedure has been fully reviewed with the patient; The risks and benefits of surgery have been discussed and explained and understood. Alternative treatment has also been reviewed, questions were encouraged and answered. The postoperative plan is also been  reviewed.  Nonsurgical treatment as described in the history and physical section was attempted and unsuccessful and the patient has agreed to proceed with surgical intervention to improve their situation.  No orders of the defined types were placed in this encounter.   Arther Abbott, MD 09/15/2018 10:44 AM

## 2018-09-19 ENCOUNTER — Other Ambulatory Visit: Payer: Self-pay | Admitting: Orthopedic Surgery

## 2018-09-19 DIAGNOSIS — Z4889 Encounter for other specified surgical aftercare: Secondary | ICD-10-CM

## 2018-09-19 DIAGNOSIS — G8929 Other chronic pain: Secondary | ICD-10-CM

## 2018-09-19 DIAGNOSIS — Z96652 Presence of left artificial knee joint: Secondary | ICD-10-CM

## 2018-09-19 DIAGNOSIS — M25561 Pain in right knee: Principal | ICD-10-CM

## 2018-09-20 ENCOUNTER — Other Ambulatory Visit: Payer: Self-pay | Admitting: Orthopedic Surgery

## 2018-09-20 DIAGNOSIS — G8929 Other chronic pain: Secondary | ICD-10-CM

## 2018-09-20 DIAGNOSIS — M25561 Pain in right knee: Principal | ICD-10-CM

## 2018-09-20 MED ORDER — TRAMADOL-ACETAMINOPHEN 37.5-325 MG PO TABS: 1.0000 | ORAL_TABLET | ORAL | 0 refills | Status: DC | PRN

## 2018-09-20 NOTE — Progress Notes (Signed)
[email or web address] search completed

## 2018-10-05 DIAGNOSIS — L0231 Cutaneous abscess of buttock: Secondary | ICD-10-CM | POA: Diagnosis not present

## 2018-10-05 DIAGNOSIS — J069 Acute upper respiratory infection, unspecified: Secondary | ICD-10-CM | POA: Diagnosis not present

## 2018-10-05 DIAGNOSIS — Z6839 Body mass index (BMI) 39.0-39.9, adult: Secondary | ICD-10-CM | POA: Diagnosis not present

## 2018-10-08 ENCOUNTER — Other Ambulatory Visit: Payer: Self-pay | Admitting: Orthopedic Surgery

## 2018-10-08 DIAGNOSIS — Z4889 Encounter for other specified surgical aftercare: Secondary | ICD-10-CM

## 2018-10-08 DIAGNOSIS — Z96652 Presence of left artificial knee joint: Secondary | ICD-10-CM

## 2018-10-24 ENCOUNTER — Other Ambulatory Visit: Payer: Self-pay | Admitting: Orthopedic Surgery

## 2018-10-24 DIAGNOSIS — S0083XA Contusion of other part of head, initial encounter: Secondary | ICD-10-CM | POA: Diagnosis not present

## 2018-10-24 DIAGNOSIS — G8929 Other chronic pain: Secondary | ICD-10-CM

## 2018-10-24 DIAGNOSIS — Z96652 Presence of left artificial knee joint: Secondary | ICD-10-CM

## 2018-10-24 DIAGNOSIS — S7002XA Contusion of left hip, initial encounter: Secondary | ICD-10-CM | POA: Diagnosis not present

## 2018-10-24 DIAGNOSIS — M25561 Pain in right knee: Secondary | ICD-10-CM

## 2018-10-24 DIAGNOSIS — Z1389 Encounter for screening for other disorder: Secondary | ICD-10-CM | POA: Diagnosis not present

## 2018-10-24 DIAGNOSIS — Z4889 Encounter for other specified surgical aftercare: Secondary | ICD-10-CM

## 2018-10-30 ENCOUNTER — Telehealth: Payer: Self-pay | Admitting: Orthopedic Surgery

## 2018-10-30 NOTE — Telephone Encounter (Signed)
LET ME SEE HIM DEC 2

## 2018-10-30 NOTE — Telephone Encounter (Signed)
Called back to patient after reviewing the date of Dec 2nd with Dr Aline Brochure - he will not be in clinic; patient aware Dr Aline Brochure will determine a date for an appointment.

## 2018-10-30 NOTE — Telephone Encounter (Signed)
Patient called, notes scheduled for right total knee replacement on 11/14/18, however, had a fall last week - states hit his head, and has a "knot" on it, and also fell onto both knees.  States on the day it happened, he saw his primary care at [facility], [place]. Said no Xrays or other testing done yet.  Please advise. (We have also requested office notes.)  Pt's [phone]

## 2018-10-30 NOTE — Patient Instructions (Signed)
[name]  10/30/2018     @PREFPERIOPPHARMACY @   Your procedure is scheduled on  11/14/2018  Report to [name] at   615  A.M.  Call this number if you have problems the morning of surgery:  [phone]   Remember:  Do not eat or drink after midnight.                        Take these medicines the morning of surgery with A SIP OF WATER hydrocodone( if needed), claritin, robaxin ( if needed), metoprolol, tramadol ( if needed).    Do not wear jewelry, make-up or nail polish.  Do not wear lotions, powders, or perfumes, or deodorant.  Do not shave 48 hours prior to surgery.  Men may shave face and neck.  Do not bring valuables to the hospital.  [facility] is not responsible for any belongings or valuables.  Contacts, dentures or bridgework may not be worn into surgery.  Leave your suitcase in the car.  After surgery it may be brought to your room.  For patients admitted to the hospital, discharge time will be determined by your treatment team.  Patients discharged the day of surgery will not be allowed to drive home.   Name and phone number of your driver:   family Special instructions:  None  Please read over the following fact sheets that you were given. Pain Booklet, Coughing and Deep Breathing, Blood Transfusion Information, Lab Information, MRSA Information, Surgical Site Infection Prevention, Anesthesia Post-op Instructions and Care and Recovery After Surgery       Total Knee Replacement Total knee replacement is a procedure to replace the knee joint with an artificial (prosthetic) knee joint. The purpose of this surgery is to reduce knee pain and improve knee function. The prosthetic knee joint (prosthesis) may be made of metal, plastic or ceramic. It replaces parts of the thigh bone (femur), lower leg bone (tibia), and kneecap (patella) that are removed during the procedure. Tell a health care provider about:  Any allergies you  have.  All medicines you are taking, including vitamins, herbs, eye drops, creams, and over-the-counter medicines.  Any problems you or family members have had with anesthetic medicines.  Any blood disorders you have.  Any surgeries you have had.  Any medical conditions you have.  Whether you are pregnant or may be pregnant. What are the risks? Generally, this is a safe procedure. However, problems may occur, including:  Infection.  Bleeding.  Allergic reactions to medicines.  Damage to other structures or organs.  Decreased range of motion of the knee.  Instability of the knee.  Loosening of the prosthetic joint.  Knee pain that does not go away (chronic pain).  What happens before the procedure?  Ask your health care provider about: ? Changing or stopping your regular medicines. This is especially important if you are taking diabetes medicines or blood thinners. ? Taking medicines such as aspirin and ibuprofen. These medicines can thin your blood. Do not take these medicines before your procedure if your health care provider instructs you not to.  Have dental care and routine cleanings completed before your procedure. Plan to not have dental work done for 3 months after your procedure. Germs from anywhere in your body, including your mouth, can travel to your new joint and infect it.  Follow instructions from your health care provider about  eating or drinking restrictions.  Ask your health care provider how your surgical site will be marked or identified.  You may be given antibiotic medicine to help prevent infection.  If your health care provider prescribes physical therapy, do exercises as instructed.  Do not use any tobacco products, such as cigarettes, chewing tobacco, or e-cigarettes. If you need help quitting, ask your health care provider.  You may have a physical exam.  You may have tests, such as: ? X-rays. ? MRI. ? CT scan. ? Bone scans.  You may  have a blood or urine sample taken.  Plan to have someone take you home after the procedure.  If you will be going home right after the procedure, plan to have someone with you for at least 24 hours. It is recommended that you have someone to help care for you for at least 4-6 weeks after your procedure. What happens during the procedure?  To reduce your risk of infection: ? Your health care team will wash or sanitize their hands. ? Your skin will be washed with soap.  An IV tube will be inserted into one of your veins.  You will be given one or more of the following: ? A medicine to help you relax (sedative). ? A medicine to numb the area (local anesthetic). ? A medicine to make you fall asleep (general anesthetic). ? A medicine that is injected into your spine to numb the area below and slightly above the injection site (spinal anesthetic). ? A medicine that is injected into an area of your body to numb everything below the injection site (regional anesthetic).  An incision will be made in your knee.  Damaged cartilage and bone will be removed from your femur, tibia, and patella.  Parts of the prosthesis (liners) will be placed over the areas of bone and cartilage that were removed. A metal liner will be placed over your femur, and plastic liners will be placed over your tibia and the underside of your patella.  One or more small tubes (drains) may be placed near your incision to help drain extra fluid from your surgical site.  Your incision will be closed with stitches (sutures), skin glue, or adhesive strips. Medicine may be applied to your incision.  A bandage (dressing) will be placed over your incision. The procedure may vary among health care providers and hospitals. What happens after the procedure?  Your blood pressure, heart rate, breathing rate, and blood oxygen level will be monitored often until the medicines you were given have worn off.  You may continue to receive  fluids and medicines through an IV tube.  You will have some pain. Pain medicines will be available to help you.  You may have fluid coming from one or more drains in your incision.  You may have to wear compression stockings. These stockings help to prevent blood clots and reduce swelling in your legs.  You will be encouraged to move around as much as possible.  You may be given a continuous passive motion machine to use at home. You will be shown how to use this machine.  Do not drive for 24 hours if you received a sedative. This information is not intended to replace advice given to you by your health care provider. Make sure you discuss any questions you have with your health care provider. Document Released: 03/07/2001 Document Revised: 01/01/2017 Document Reviewed: 11/05/2015 Elsevier Interactive Patient Education  2018 [employer].  Total Knee Replacement, Care After  Refer to this sheet in the next few weeks. These instructions provide you with information about caring for yourself after your procedure. Your health care provider may also give you more specific instructions. Your treatment has been planned according to current medical practices, but problems sometimes occur. Call your health care provider if you have any problems or questions after your procedure. What can I expect after the procedure? After the procedure, it is common to have:  Pain and swelling.  A small amount of blood or clear fluid coming from your incision.  Limited range of motion.  Follow these instructions at home: Medicines  Take over-the-counter and prescription medicines only as told by your health care provider.  If you were prescribed an antibiotic medicine, take it as told by your health care provider. Do not stop taking the antibiotic even if you start to feel better.  If you were prescribed a blood thinner (anticoagulant), take it as told by your health care provider. If you have a splint  or brace:  Wear the immobilizer as told by your health care provider. Remove it only as told by your health care provider.  Loosen the immobilizer if your toes tingle, become numb, or turn cold and blue.  Do not let your immobilizer get wet if it is not waterproof.  Keep the immobilizer clean. Bathing   Do not take baths, swim, or use a hot tub until your health care provider approves. Ask your health care provider if you can take showers. You may only be allowed to take sponge baths for bathing.  If you have an immobilizer that is not waterproof, cover it with a watertight covering when you take a bath or shower.  Keep your bandage (dressing) dry until your health care provider says it can be removed. Incision care and drain care  Check your incision area and drain site every day for signs of infection. Check for: ? More redness, swelling, or pain. ? More fluid or blood. ? Warmth. ? Pus or a bad smell.  Follow instructions from your health care provider about how to take care of your incision. Make sure you: ? Wash your hands with soap and water before you change your dressing. If soap and water are not available, use hand sanitizer. ? Change your dressing as told by your health care provider. ? Leave stitches (sutures), skin glue, or adhesive strips in place. These skin closures may need to stay in place for 2 weeks or longer. If adhesive strip edges start to loosen and curl up, you may trim the loose edges. Do not remove adhesive strips completely unless your health care provider tells you to do that.  If you have a drain, follow instructions from your health care provider about caring for it. Do not remove the drain tube or any dressings around the tube opening unless your health care provider approves. Managing pain, stiffness, and swelling  If directed, put ice on your knee. ? Put ice in a plastic bag. ? Place a towel between your skin and the bag. ? Leave the ice on for 20  minutes, 2-3 times per day.  If directed, apply heat to the affected area as often as told by your health care provider. Use the heat source that your health care provider recommends, such as a moist heat pack or a heating pad. ? Place a towel between your skin and the heat source. ? Leave the heat on for 20-30 minutes. ? Remove the heat if  your skin turns bright red. This is especially important if you are unable to feel pain, heat, or cold. You may have a greater risk of getting burned.  Move your toes often to avoid stiffness and to lessen swelling.  Raise (elevate) your knee above the level of your heart while you are sitting or lying down.  Wear elastic knee support for as long as told by your health care provider. Driving   Do not drive until your health care provider approves. Ask your health care provider when it is safe to drive if you have an immobilizer on your knee.  Do not drive or operate heavy machinery while taking prescription pain medicine.  Do not drive for 24 hours if you received a sedative. Activity  Do not lift anything that is heavier than 10 lb (4.5 kg) until your health care provider approves.  Do not play contact sports until your health care provider approves.  Avoid high-impact activities, including running, jumping rope, and jumping jacks.  Avoid sitting for a long time without moving. Get up and move around at least every few hours.  If physical therapy was prescribed, do exercises as told by your health care provider.  Return to your normal activities as told by your health care provider. Ask your health care provider what activities are safe for you. Safety  Do not use your leg to support your body weight until your health care provider approves. Use crutches or a walker as told by your health care provider. General instructions   Do not have any dental work done for at least 3 months after your surgery. When you do have dental work done, tell  your dentist about your joint replacement.  Do not use any tobacco products, such as cigarettes, chewing tobacco, or e-cigarettes. If you need help quitting, ask your health care provider.  Wear compression stockings as told by your health care provider. These stockings help to prevent blood clots and reduce swelling in your legs.  If you have been sent home with a continuous passive motion machine, use it as told by your health care provider.  Drink enough fluid to keep your urine clear or pale yellow.  If you have been instructed to lose weight, follow instructions from your health care provider about how to do this safely.  Keep all follow-up visits as told by your health care provider. This is important. Contact a health care provider if:  You have more redness, swelling, or pain around your incision or drain.  You have more fluid or blood coming from your incision or drain.  Your incision or drain site feels warm to the touch.  You have pus or a bad smell coming from your incision or drain.  You have a fever.  Your incision breaks open after your health care provider removes your sutures, skin glue, or adhesive tape.  Your prosthesis feels loose.  You have knee pain that does not go away. Get help right away if:  You have a rash.  You have pain or swelling in your calf or thigh.  You have shortness of breath or difficulty breathing.  You have chest pain.  Your range of motion in your knee is getting worse. This information is not intended to replace advice given to you by your health care provider. Make sure you discuss any questions you have with your health care provider. Document Released: 06/18/2005 Document Revised: 08/02/2016 Document Reviewed: 11/05/2015 Elsevier Interactive Patient Education  [name].  Spinal Anesthesia and Epidural Anesthesia Spinal anesthesia and epidural anesthesia are methods of numbing the body. They are done by injecting  numbing medicine (anesthetic) into the back, near the spinal cord. Spinal anesthesia is usually done to numb an area at and below the place where the injection is made. It is often used during surgeries of the pelvis, hips, legs, and lower abdomen. It begins to work almost immediately. Epidural anesthesia may be done to numb an area above or below the area where the injection is made. It is often used during childbirth and after major abdominal or chest surgery. It begins to work after 10-20 minutes. Tell a health care provider about:  Any allergies you have.  All medicines you are taking, including vitamins, herbs, eye drops, creams, and over-the-counter medicines.  Any problems you or family members have had with anesthetic medicines.  Any blood disorders you have.  Any surgeries you have had.  Any medical conditions you have.  Whether you are pregnant or may be pregnant.  Any recent alcohol, tobacco, or drug use. What are the risks? Generally, this is a safe procedure. However, problems may occur, including:  Headache.  A drop in blood pressure. In some cases, this can lead to a heart attack or a stroke.  Nerve damage.  Infection.  Allergic reaction to medicines.  Seizures.  Spinal fluid leak.  Bleeding around the injection site.  Inability to breathe. If this happens, a tube may be put into your windpipe (trachea) and a machine may be used to help you breathe until the anesthetic wears off.  Long-lasting numbness, pain, or loss of function of body parts.  Nausea and vomiting.  Dizziness and fainting.  Shivering.  Itching.  What happens before the procedure? Staying hydrated Follow instructions from your health care provider about hydration, which may include:  Up to 2 hours before the procedure - you may continue to drink clear liquids, such as water, clear fruit juice, black coffee, and plain tea.  Eating and drinking restrictions Follow instructions  from your health care provider about eating and drinking, which may include:  8 hours before the procedure - stop eating heavy meals or foods such as meat, fried foods, or fatty foods.  6 hours before the procedure - stop eating light meals or foods, such as toast or cereal.  6 hours before the procedure - stop drinking milk or drinks that contain milk.  2 hours before the procedure - stop drinking clear liquids.  Medicine Ask your health care provider about:  Changing or stopping your regular medicines. This is especially important if you are taking diabetes medicines or blood thinners.  Changing or stopping your dietary supplements.  Taking medicines such as aspirin and ibuprofen. These medicines can thin your blood. Do not take these medicines before your procedure if your health care provider instructs you not to.  General instructions   Do not use any tobacco products, such as cigarettes, chewing tobacco, and electronic cigarettes, for as long as possible before your procedure. If you need help quitting, ask your health care provider.  Ask your health care provider if you will have to stay overnight at the hospital or clinic.  If you will not have to stay overnight: ? Plan to have someone take you home from the hospital or clinic. ? Plan to have someone with you for 24 hours. What happens during the procedure?  A health care provider will put patches on your chest, a cuff around your  arm, or a sensor device on your finger. These will be attached to monitors that allow your health care provider to watch your blood pressure, pulse, and oxygen levels to make sure that the anesthetic does not cause any problems.  An IV tube may be inserted into one of your veins. The tube will be used to give you fluids and medicines throughout the procedure as needed.  You may be given a medicine to help you relax (sedative).  You will be asked to sit up or to lie on your side with your knees  and your chin bent toward your chest. These positions open up the space between the bones in your back, making it easier to inject the medicine.  The area of your back where the medicine will be injected will be cleaned.  A medicine called a local anesthetic may be injected to numb the area where the spinal or epidural anesthetic will be injected.  A needle will be inserted between the bones of your back. While this is being done: ? Continue to breathe normally. ? Stay as still and quiet as you can. ? If you feel a tingling shock or pain going down your leg, tell your health care provider but try not to move.  The spinal or epidural anesthetic will be injected.  If you receive an epidural anesthetic and need more than one dose, a tiny, flexible tube (catheter) will be placed where the anesthetic was injected. Additional doses will be given through the catheter. If you need pain medicine after the procedure, the catheter will be kept in place.  If an epidural catheter has not been placed in your injection site or left there, a small bandage (dressing) will be placed over the injection site. The procedure may vary among health care providers and hospitals. What happens after the procedure?  You will need to stay in bed until your health care provider says it safe for you to walk.  Your blood pressure, heart rate, breathing rate, and blood oxygen level will be monitored until the medicines you were given have worn off.  If you have a catheter, it will be removed when it is no longer needed.  Do not drive for 24 hours if you received a sedative.  It is common to have nausea and itching. There are medicines that can help with these side effects. It is also common to have: ? Sleepiness. ? Vomiting. ? Numbness or tingling in your legs. ? Trouble urinating. This information is not intended to replace advice given to you by your health care provider. Make sure you discuss any questions you  have with your health care provider. Document Released: 02/19/2004 Document Revised: 05/11/2016 Document Reviewed: 03/22/2016 Elsevier Interactive Patient Education  2018 [employer].  Spinal Anesthesia and Epidural Anesthesia, Care After These instructions give you information about caring for yourself after your procedure. Your doctor may also give you more specific instructions. Call your doctor if you have any problems or questions after your procedure. Follow these instructions at home: For at least 24 hours after the procedure:   Do not: ? Do activities where you could fall or get hurt (injured). ? Drive. ? Use heavy machinery. ? Drink alcohol. ? Take sleeping pills or medicines that make you sleepy (drowsy). ? Make important decisions. ? Sign legal documents. ? Take care of children on your own.  Rest. Eating and drinking  If you throw up (vomit), drink water, juice, or soup when you can drink  without throwing up.  Make sure you do not feel like throwing up (are not nauseous) before you eat.  Follow the diet that is recommended by your doctor. General instructions  Have a responsible adult stay with you until you are awake and alert.  Take over-the-counter and prescription medicines only as told by your doctor.  If you smoke, do not smoke unless an adult is watching.  Keep all follow-up visits as told by your doctor. This is important. Contact a doctor if:  It has been more than one day since your procedure and you feel like throwing up.  It has been more than one day since your procedure and you throw up.  You have a rash. Get help right away if:  You have a fever.  You have a headache that lasts a long time.  You have a very bad headache.  Your vision is blurry.  You see two of a single object (double vision).  You are dizzy or light-headed.  You faint.  Your arms or legs tingle, feel weak, or get numb.  You have trouble breathing.  You  cannot pee (urinate). This information is not intended to replace advice given to you by your health care provider. Make sure you discuss any questions you have with your health care provider. Document Released: 03/22/2016 Document Revised: 07/22/2016 Document Reviewed: 03/22/2016 Elsevier Interactive Patient Education  2018 [employer] Anesthesia, Adult General anesthesia is the use of medicines to make a person "go to sleep" (be unconscious) for a medical procedure. General anesthesia is often recommended when a procedure:  Is long.  Requires you to be still or in an unusual position.  Is major and can cause you to lose blood.  Is impossible to do without general anesthesia.  The medicines used for general anesthesia are called general anesthetics. In addition to making you sleep, the medicines:  Prevent pain.  Control your blood pressure.  Relax your muscles.  Tell a health care provider about:  Any allergies you have.  All medicines you are taking, including vitamins, herbs, eye drops, creams, and over-the-counter medicines.  Any problems you or family members have had with anesthetic medicines.  Types of anesthetics you have had in the past.  Any bleeding disorders you have.  Any surgeries you have had.  Any medical conditions you have.  Any history of heart or lung conditions, such as heart failure, sleep apnea, or chronic obstructive pulmonary disease (COPD).  Whether you are pregnant or may be pregnant.  Whether you use tobacco, alcohol, marijuana, or street drugs.  Any history of Armed forces logistics/support/administrative officer.  Any history of depression or anxiety. What are the risks? Generally, this is a safe procedure. However, problems may occur, including:  Allergic reaction to anesthetics.  Lung and heart problems.  Inhaling food or liquids from your stomach into your lungs (aspiration).  Injury to nerves.  Waking up during your procedure and being unable to  move (rare).  Extreme agitation or a state of mental confusion (delirium) when you wake up from the anesthetic.  Air in the bloodstream, which can lead to stroke.  These problems are more likely to develop if you are having a major surgery or if you have an advanced medical condition. You can prevent some of these complications by answering all of your health care provider's questions thoroughly and by following all pre-procedure instructions. General anesthesia can cause side effects, including:  Nausea or vomiting  A sore throat from the  breathing tube.  Feeling cold or shivery.  Feeling tired, washed out, or achy.  Sleepiness or drowsiness.  Confusion or agitation.  What happens before the procedure? Staying hydrated Follow instructions from your health care provider about hydration, which may include:  Up to 2 hours before the procedure - you may continue to drink clear liquids, such as water, clear fruit juice, black coffee, and plain tea.  Eating and drinking restrictions Follow instructions from your health care provider about eating and drinking, which may include:  8 hours before the procedure - stop eating heavy meals or foods such as meat, fried foods, or fatty foods.  6 hours before the procedure - stop eating light meals or foods, such as toast or cereal.  6 hours before the procedure - stop drinking milk or drinks that contain milk.  2 hours before the procedure - stop drinking clear liquids.  Medicines  Ask your health care provider about: ? Changing or stopping your regular medicines. This is especially important if you are taking diabetes medicines or blood thinners. ? Taking medicines such as aspirin and ibuprofen. These medicines can thin your blood. Do not take these medicines before your procedure if your health care provider instructs you not to. ? Taking new dietary supplements or medicines. Do not take these during the week before your procedure  unless your health care provider approves them.  If you are told to take a medicine or to continue taking a medicine on the day of the procedure, take the medicine with sips of water. General instructions   Ask if you will be going home the same day, the following day, or after a longer hospital stay. ? Plan to have someone take you home. ? Plan to have someone stay with you for the first 24 hours after you leave the hospital or clinic.  For 3-6 weeks before the procedure, try not to use any tobacco products, such as cigarettes, chewing tobacco, and e-cigarettes.  You may brush your teeth on the morning of the procedure, but make sure to spit out the toothpaste. What happens during the procedure?  You will be given anesthetics through a mask and through an IV tube in one of your veins.  You may receive medicine to help you relax (sedative).  As soon as you are asleep, a breathing tube may be used to help you breathe.  An anesthesia specialist will stay with you throughout the procedure. He or she will help keep you comfortable and safe by continuing to give you medicines and adjusting the amount of medicine that you get. He or she will also watch your blood pressure, pulse, and oxygen levels to make sure that the anesthetics do not cause any problems.  If a breathing tube was used to help you breathe, it will be removed before you wake up. The procedure may vary among health care providers and hospitals. What happens after the procedure?  You will wake up, often slowly, after the procedure is complete, usually in a recovery area.  Your blood pressure, heart rate, breathing rate, and blood oxygen level will be monitored until the medicines you were given have worn off.  You may be given medicine to help you calm down if you feel anxious or agitated.  If you will be going home the same day, your health care provider may check to make sure you can stand, drink, and urinate.  Your  health care providers will treat your pain and side effects before  you go home.  Do not drive for 24 hours if you received a sedative.  You may: ? Feel nauseous and vomit. ? Have a sore throat. ? Have mental slowness. ? Feel cold or shivery. ? Feel sleepy. ? Feel tired. ? Feel sore or achy, even in parts of your body where you did not have surgery. This information is not intended to replace advice given to you by your health care provider. Make sure you discuss any questions you have with your health care provider. Document Released: 03/07/2008 Document Revised: 05/11/2016 Document Reviewed: 11/13/2015 Elsevier Interactive Patient Education  2018 [place] Anesthesia, Adult, Care After These instructions provide you with information about caring for yourself after your procedure. Your health care provider may also give you more specific instructions. Your treatment has been planned according to current medical practices, but problems sometimes occur. Call your health care provider if you have any problems or questions after your procedure. What can I expect after the procedure? After the procedure, it is common to have:  Vomiting.  A sore throat.  Mental slowness.  It is common to feel:  Nauseous.  Cold or shivery.  Sleepy.  Tired.  Sore or achy, even in parts of your body where you did not have surgery.  Follow these instructions at home: For at least 24 hours after the procedure:  Do not: ? Participate in activities where you could fall or become injured. ? Drive. ? Use heavy machinery. ? Drink alcohol. ? Take sleeping pills or medicines that cause drowsiness. ? Make important decisions or sign legal documents. ? Take care of children on your own.  Rest. Eating and drinking  If you vomit, drink water, juice, or soup when you can drink without vomiting.  Drink enough fluid to keep your urine clear or pale yellow.  Make sure you have little or no  nausea before eating solid foods.  Follow the diet recommended by your health care provider. General instructions  Have a responsible adult stay with you until you are awake and alert.  Return to your normal activities as told by your health care provider. Ask your health care provider what activities are safe for you.  Take over-the-counter and prescription medicines only as told by your health care provider.  If you smoke, do not smoke without supervision.  Keep all follow-up visits as told by your health care provider. This is important. Contact a health care provider if:  You continue to have nausea or vomiting at home, and medicines are not helpful.  You cannot drink fluids or start eating again.  You cannot urinate after 8-12 hours.  You develop a skin rash.  You have fever.  You have increasing redness at the site of your procedure. Get help right away if:  You have difficulty breathing.  You have chest pain.  You have unexpected bleeding.  You feel that you are having a life-threatening or urgent problem. This information is not intended to replace advice given to you by your health care provider. Make sure you discuss any questions you have with your health care provider. Document Released: 03/07/2001 Document Revised: 05/03/2016 Document Reviewed: 11/13/2015 Elsevier Interactive Patient Education  [name].

## 2018-11-01 NOTE — Telephone Encounter (Signed)
Nov 25th

## 2018-11-01 NOTE — Telephone Encounter (Signed)
Called patient; scheduled appointment accordingly.

## 2018-11-02 ENCOUNTER — Other Ambulatory Visit: Payer: Self-pay | Admitting: Orthopedic Surgery

## 2018-11-02 DIAGNOSIS — M1711 Unilateral primary osteoarthritis, right knee: Secondary | ICD-10-CM

## 2018-11-02 NOTE — Progress Notes (Signed)
Called to initiate prior authorization for patients total knee replacement case #[id number]   Have put in orders for home health therapy and for his CPM

## 2018-11-06 ENCOUNTER — Encounter: Payer: Self-pay | Admitting: Orthopedic Surgery

## 2018-11-06 ENCOUNTER — Ambulatory Visit (INDEPENDENT_AMBULATORY_CARE_PROVIDER_SITE_OTHER): Payer: Federal, State, Local not specified - PPO | Admitting: Orthopedic Surgery

## 2018-11-06 VITALS — BP 122/69 | HR 96 | Ht 70.5 in | Wt 273.0 lb

## 2018-11-06 DIAGNOSIS — I2510 Atherosclerotic heart disease of native coronary artery without angina pectoris: Secondary | ICD-10-CM | POA: Diagnosis not present

## 2018-11-06 DIAGNOSIS — W19XXXA Unspecified fall, initial encounter: Secondary | ICD-10-CM

## 2018-11-06 NOTE — Progress Notes (Signed)
Chief Complaint  Patient presents with  . Knee Pain    After fall 2 weeks ago    Surgery scheduled for this patient on December 3 for total knee he fell at his home hit his head and had memory loss.  Other than some bumps and bruises he is currently doing pretty good other than some aches and pains from the bumps and bruises.  His skin over both knees looks normal he has some bruising on his left hip and trunk area.  He denies any history of blurred vision numbness tingling headache  Status post fall should be okay for surgery on December 3  Encounter Diagnosis  Name Primary?  . Fall, initial encounter Yes

## 2018-11-07 ENCOUNTER — Telehealth: Payer: Self-pay | Admitting: Orthopedic Surgery

## 2018-11-07 NOTE — Telephone Encounter (Signed)
Patient left message on voicemail that he would like to cancel his surgery until after 12/13/2018. His wife asked him to wait until he is healed up, so he stated he would call back next week and schedule an appointment in December so things could be gone over again and then get surgery rescheduled.

## 2018-11-07 NOTE — Telephone Encounter (Signed)
Called patient to RS left message for him/ to Dr Bill Salinas

## 2018-11-08 ENCOUNTER — Encounter (HOSPITAL_COMMUNITY)
Admission: RE | Admit: 2018-11-08 | Discharge: 2018-11-08 | Disposition: A | Discharge status: Home / Self Care | Payer: Federal, State, Local not specified - PPO | Source: Ambulatory Visit | Attending: Orthopedic Surgery | Admitting: Orthopedic Surgery

## 2018-11-08 ENCOUNTER — Encounter (HOSPITAL_COMMUNITY): Payer: Self-pay

## 2018-11-13 ENCOUNTER — Telehealth: Payer: Self-pay | Admitting: Orthopedic Surgery

## 2018-11-13 ENCOUNTER — Telehealth: Payer: Self-pay | Admitting: Radiology

## 2018-11-13 NOTE — Telephone Encounter (Signed)
Patient left message on voicemail that he would love to reschedule his surgery in January. He wanted to let Dr. Aline Brochure know he fell again.  Please call and advise

## 2018-11-13 NOTE — Telephone Encounter (Signed)
Left another message for patient, have open surgery orders, need a date in January. Unless he wants to cancel, then I can just cancel the orders.

## 2018-11-14 ENCOUNTER — Other Ambulatory Visit: Payer: Self-pay | Admitting: Orthopedic Surgery

## 2018-11-14 DIAGNOSIS — M25561 Pain in right knee: Principal | ICD-10-CM

## 2018-11-14 DIAGNOSIS — G8929 Other chronic pain: Secondary | ICD-10-CM

## 2018-11-14 NOTE — Telephone Encounter (Signed)
I have RS his surgery to Jan 14th, but he has fallen again, he does not know what is happening, he is not tripping over anything. I have advised him to let his primary care know and to try to find out why he is falling. He states he has an upcoming appointment with the Cardiologist and will discuss with Cardiology  To you [facility]

## 2018-11-22 DIAGNOSIS — J22 Unspecified acute lower respiratory infection: Secondary | ICD-10-CM | POA: Diagnosis not present

## 2018-11-22 DIAGNOSIS — J209 Acute bronchitis, unspecified: Secondary | ICD-10-CM | POA: Diagnosis not present

## 2018-11-22 DIAGNOSIS — Z1389 Encounter for screening for other disorder: Secondary | ICD-10-CM | POA: Diagnosis not present

## 2018-11-22 DIAGNOSIS — Z6841 Body Mass Index (BMI) 40.0 and over, adult: Secondary | ICD-10-CM | POA: Diagnosis not present

## 2018-11-26 ENCOUNTER — Other Ambulatory Visit: Payer: Self-pay | Admitting: Orthopedic Surgery

## 2018-11-26 DIAGNOSIS — Z96652 Presence of left artificial knee joint: Secondary | ICD-10-CM

## 2018-11-26 DIAGNOSIS — Z4889 Encounter for other specified surgical aftercare: Secondary | ICD-10-CM

## 2018-11-27 ENCOUNTER — Ambulatory Visit: Payer: Federal, State, Local not specified - PPO | Admitting: Orthopedic Surgery

## 2018-12-04 ENCOUNTER — Ambulatory Visit (INDEPENDENT_AMBULATORY_CARE_PROVIDER_SITE_OTHER): Payer: Federal, State, Local not specified - PPO | Admitting: Cardiology

## 2018-12-04 ENCOUNTER — Encounter: Payer: Self-pay | Admitting: Cardiology

## 2018-12-04 VITALS — BP 144/78 | HR 86 | Ht 70.5 in | Wt 268.0 lb

## 2018-12-04 DIAGNOSIS — Z01810 Encounter for preprocedural cardiovascular examination: Secondary | ICD-10-CM

## 2018-12-04 DIAGNOSIS — I25119 Atherosclerotic heart disease of native coronary artery with unspecified angina pectoris: Secondary | ICD-10-CM

## 2018-12-04 DIAGNOSIS — Z9989 Dependence on other enabling machines and devices: Secondary | ICD-10-CM | POA: Diagnosis not present

## 2018-12-04 DIAGNOSIS — E782 Mixed hyperlipidemia: Secondary | ICD-10-CM

## 2018-12-04 DIAGNOSIS — G4733 Obstructive sleep apnea (adult) (pediatric): Secondary | ICD-10-CM

## 2018-12-04 DIAGNOSIS — I2510 Atherosclerotic heart disease of native coronary artery without angina pectoris: Secondary | ICD-10-CM

## 2018-12-04 NOTE — Patient Instructions (Signed)
Medication Instructions:  Your physician recommends that you continue on your current medications as directed. Please refer to the Current Medication list given to you today.  If you need a refill on your cardiac medications before your next appointment, please call your pharmacy.   Lab work: None today If you have labs (blood work) drawn today and your tests are completely normal, you will receive your results only by: . MyChart Message (if you have MyChart) OR . A paper copy in the mail If you have any lab test that is abnormal or we need to change your treatment, we will call you to review the results.  Testing/Procedures: None today  Follow-Up: At [facility], you and your health needs are our priority.  As part of our continuing mission to provide you with exceptional heart care, we have created designated Provider Care Teams.  These Care Teams include your primary Cardiologist (physician) and Advanced Practice Providers (APPs -  Physician Assistants and Nurse Practitioners) who all work together to provide you with the care you need, when you need it. You will need a follow up appointment in 6 months.  Please call our office 2 months in advance to schedule this appointment.  You may see Samuel McDowell, MD or one of the following Advanced Practice Providers on your designated Care Team:   Brittany Strader, PA-C ([place] Office) . Michele Lenze, PA-C ([place] Office)  Any Other Special Instructions Will Be Listed Below (If Applicable). None   

## 2018-12-04 NOTE — Progress Notes (Signed)
Cardiology Office Note  Date: 12/04/2018   ID: Oneal, Schoenberger 10-Feb-1961, MRN [id number]  PCP: [name], MD  Primary Cardiologist: Rozann Lesches, MD   Chief Complaint  Patient presents with  . Coronary Artery Disease    History of Present Illness: [name] is a 57 y.o. male last seen by Ms. Strader PA-C in June.  He is here for a routine visit.  He does not report any angina symptoms on medical therapy, stable NYHA class II dyspnea.  He has been having a lot of trouble with knee pain, scheduled to undergo right knee replacement with Dr. Aline Brochure in mid January.  He does not report any palpitations or obvious syncope.  He did have a fall while walking in the hall in his house, states that he lost his balance, may have been favoring his right knee due to pain.  He hit his head, states that he saw his PCP for follow-up without major injury.  He has had no other obvious unexplained falls.  Head CT from 2017 was negative.  I reviewed his cardiac medications.  He is currently on aspirin, Zetia, Lasix, Lopressor, and Pravachol.  Follow-up lipid panel in September showed LDL 36.  I reviewed his ECG from May.  Past Medical History:  Diagnosis Date  . Arthritis   . CAD (coronary artery disease)    Multivessel disease status post CABG 08/2015  . CKD (chronic kidney disease), stage II   . Essential hypertension   . Hyperlipidemia   . Hypertension   . OSA on CPAP   . Type 2 diabetes mellitus ([place])     Past Surgical History:  Procedure Laterality Date  . APPENDECTOMY    . Biceps tendon surgery Right   . CARDIAC CATHETERIZATION N/A 08/25/2015   Procedure: Left Heart Cath and Coronary Angiography;  Surgeon: Belva Crome, MD;  Location: [facility];  Service: Cardiovascular;  Laterality: N/A;  . CORONARY ARTERY BYPASS GRAFT N/A 08/29/2015   Procedure: CORONARY ARTERY BYPASS GRAFTING (CABG);  Surgeon: [name], MD;  Location: [facility];  Service:  Open Heart Surgery;  Laterality: N/A;  . KNEE ARTHROSCOPY Left   . TEE WITHOUT CARDIOVERSION N/A 08/29/2015   Procedure: TRANSESOPHAGEAL ECHOCARDIOGRAM (TEE);  Surgeon: [name], MD;  Location: [facility];  Service: Open Heart Surgery;  Laterality: N/A;  . TOTAL KNEE ARTHROPLASTY Left 03/09/2017   Procedure: TOTAL KNEE ARTHROPLASTY;  Surgeon: Carole Civil, MD;  Location: [facility];  Service: Orthopedics;  Laterality: Left;    Current Outpatient Medications  Medication Sig Dispense Refill  . aspirin EC 81 MG tablet Take 81 mg by mouth at bedtime.     . diphenhydrAMINE (BENADRYL) 50 MG tablet Take 1 tablet (50 mg total) by mouth at bedtime as needed for itching. 30 tablet 0  . ezetimibe (ZETIA) 10 MG tablet Take 1 tablet (10 mg total) by mouth daily. 90 tablet 3  . Flaxseed, Linseed, (FLAXSEED OIL PO) Take 500 mg by mouth daily.    . furosemide (LASIX) 20 MG tablet Take 1 tablet (20 mg total) by mouth daily. 90 tablet 3  . hydrochlorothiazide (HYDRODIURIL) 25 MG tablet Take 12.5 mg by mouth daily.   3  . insulin aspart (NOVOLOG) 100 UNIT/ML injection Inject 0.4-35 Units into the skin 3 (three) times daily before meals.     . insulin lispro protamine-lispro (HUMALOG 75/25 MIX) (75-25) 100 UNIT/ML SUSP injection Inject 35-70 Units into the skin 3 (  three) times daily.     [name] KOMBIGLYZE XR 2.04-999 MG TB24 Take 1 tablet by mouth 2 (two) times daily.     [name] loratadine (CLARITIN) 10 MG tablet Take 10 mg by mouth daily.    . methocarbamol (ROBAXIN) 500 MG tablet TAKE 1 TABLET(500 MG) BY MOUTH THREE TIMES DAILY 56 tablet 0  . metoprolol tartrate (LOPRESSOR) 25 MG tablet TAKE 1/2 TABLET(12.5 MG) BY MOUTH TWICE DAILY (Patient taking differently: Take 12.5 mg by mouth 2 (two) times daily. ) 90 tablet 3  . Multiple Vitamins-Minerals (MULTIVITAMIN ADULT PO) Take 1 tablet by mouth daily.    [name] omeprazole (PRILOSEC) 40 MG capsule Take 40 mg by mouth at bedtime.  2  . pravastatin (PRAVACHOL) 20 MG tablet  TAKE 1 TABLET(20 MG) BY MOUTH AT BEDTIME (Patient taking differently: Take 20 mg by mouth daily. ) 90 tablet 3  . traMADol-acetaminophen (ULTRACET) 37.5-325 MG tablet Take 1 tablet by mouth every 4 (four) hours as needed. for pain 60 tablet 0  . TRESIBA FLEXTOUCH 200 UNIT/ML SOPN Inject 60-70 Units into the skin See admin instructions. Inject 70 units subcutaneously in the morning and 60 units in the evening    . TURMERIC CURCUMIN PO Take 1,000 mg by mouth daily.      No current facility-administered medications for this visit.    Allergies:  Patient has no known allergies.   Social History: The patient  reports that he has never smoked. He has never used smokeless tobacco. He reports that he does not drink alcohol or use drugs.   ROS:  Please see the history of present illness. Otherwise, complete review of systems is positive for arthritic pain.  All other systems are reviewed and negative.   Physical Exam: VS:  BP (!) 144/78 (BP Location: Right Arm)   Pulse 86   Ht 5' 10.5" (1.791 m)   Wt 268 lb (121.6 kg)   SpO2 98%   BMI 37.91 kg/m , BMI Body mass index is 37.91 kg/m.  Wt Readings from Last 3 Encounters:  12/04/18 268 lb (121.6 kg)  11/06/18 273 lb (123.8 kg)  09/15/18 265 lb (120.2 kg)    General: Morbidly obese male, appears comfortable at rest. HEENT: Conjunctiva and lids normal, oropharynx clear. Neck: Supple, no elevated JVP or carotid bruits, no thyromegaly. Lungs: Clear to auscultation, nonlabored breathing at rest. Cardiac: Regular rate and rhythm, no S3, soft systolic murmur, no pericardial rub. Abdomen: Obese, nontender, bowel sounds present. Extremities: Mild, stable ankle edema, distal pulses 2+. Skin: Warm and dry. Musculoskeletal: No kyphosis. Neuropsychiatric: Alert and oriented x3, affect grossly appropriate.  ECG: I personally reviewed the tracing from 05/04/2018 which showed sinus rhythm with poor R wave progression, rule out old anterior infarct pattern,  low voltage.  Recent Labwork: 05/04/2018: B Natriuretic Peptide 29.0; TSH 2.420 05/12/2018: Hemoglobin 11.0; Platelets 91 06/07/2018: BUN 22; Creatinine, Ser 1.38; Potassium 3.9; Sodium 134 08/31/2018: ALT 21; AST 59     Component Value Date/Time   CHOL 103 08/31/2018 1137   TRIG 187 (H) 08/31/2018 1137   HDL 30 (L) 08/31/2018 1137   CHOLHDL 3.4 08/31/2018 1137   VLDL 37 08/31/2018 1137   LDLCALC 36 08/31/2018 1137    Other Studies Reviewed Today:  Echocardiogram 05/10/2018: Study Conclusions  - Left ventricle: The cavity size was normal. Wall thickness was   increased in a pattern of mild LVH. Systolic function was normal.   The estimated ejection fraction was in the range of 55% to  60%.   Wall motion was normal; there were no regional wall motion   abnormalities. Left ventricular diastolic function parameters   were normal for the patient&'s age. - Aortic valve: Moderately calcified annulus. Trileaflet; mildly   calcified leaflets. There was mild stenosis. Mean gradient (S):   10 mm Hg. Peak gradient (S): 18 mm Hg. Valve area (VTI): 1.68   cm^2. - Mitral valve: Mildly calcified annulus. There was trivial   regurgitation. - Right ventricle: Systolic function was low normal. - Right atrium: Central venous pressure (est): 3 mm Hg. - Atrial septum: No defect or patent foramen ovale was identified. - Tricuspid valve: There was trivial regurgitation. - Pulmonary arteries: PA peak pressure: 9 mm Hg (S). - Pericardium, extracardiac: There was no pericardial effusion.  Assessment and Plan:  1.  Multivessel CAD status post CABG in 2016.  He reports no significant angina symptoms on medical therapy.  LVEF 55 to 60% range by echocardiogram in May.  Continue medical therapy and observation.  2.  Preoperative evaluation prior to right total knee replacement with Dr. Aline Brochure in January under general anesthesia.  RCRI risk calculator indicates class III risk, 6.6 percent risk of major  cardiac event.  Overall low to intermediate risk, no further cardiac testing is planned in the absence of progressive angina symptoms.  3.  OSA on CPAP.  He follows with Dr. Hilma Favors.  4.  Mixed hyperlipidemia, on Pravachol and Zetia with recent LDL 36.  5.  Type 2 diabetes mellitus, follows with Dr. Hilma Favors.  Current medicines were reviewed with the patient today.  Disposition: Follow-up in 6 months.  Signed, Satira Sark, MD, [facility] 12/04/2018 1:23 PM    [facility] at [facility] [address], [place], [place] [address] Phone: [phone]; Fax: [phone]

## 2018-12-08 DIAGNOSIS — N182 Chronic kidney disease, stage 2 (mild): Secondary | ICD-10-CM | POA: Diagnosis not present

## 2018-12-08 DIAGNOSIS — E1129 Type 2 diabetes mellitus with other diabetic kidney complication: Secondary | ICD-10-CM | POA: Diagnosis not present

## 2018-12-08 DIAGNOSIS — I5022 Chronic systolic (congestive) heart failure: Secondary | ICD-10-CM | POA: Diagnosis not present

## 2018-12-08 DIAGNOSIS — I10 Essential (primary) hypertension: Secondary | ICD-10-CM | POA: Diagnosis not present

## 2018-12-08 DIAGNOSIS — J069 Acute upper respiratory infection, unspecified: Secondary | ICD-10-CM | POA: Diagnosis not present

## 2018-12-08 DIAGNOSIS — Z1389 Encounter for screening for other disorder: Secondary | ICD-10-CM | POA: Diagnosis not present

## 2018-12-08 DIAGNOSIS — Z6839 Body mass index (BMI) 39.0-39.9, adult: Secondary | ICD-10-CM | POA: Diagnosis not present

## 2018-12-14 NOTE — Patient Instructions (Signed)
ARYN KOPS  12/14/2018     @PREFPERIOPPHARMACY @   Your procedure is scheduled on  12/26/2018  Report to [name] at  615   A.M.  Call this number if you have problems the morning of surgery:  [phone]   Remember:  Do not eat or drink after midnight.                        Take these medicines the morning of surgery with A SIP OF WATER  Claritin, robaxin ( if needed), prilosec, tramadol ( if needed). Only take 1/2 of your usual night time insulin dosage. DO NOT take any medications for diabetes the morning of your procedure.    Do not wear jewelry, make-up or nail polish.  Do not wear lotions, powders, or perfumes, or deodorant.  Do not shave 48 hours prior to surgery.  Men may shave face and neck.  Do not bring valuables to the hospital.  [facility] is not responsible for any belongings or valuables.  Contacts, dentures or bridgework may not be worn into surgery.  Leave your suitcase in the car.  After surgery it may be brought to your room.  For patients admitted to the hospital, discharge time will be determined by your treatment team.  Patients discharged the day of surgery will not be allowed to drive home.   Name and phone number of your driver:   family Special instructions:  None  Please read over the following fact sheets that you were given. Pain Booklet, Coughing and Deep Breathing, Blood Transfusion Information, Total Joint Packet, MRSA Information, Surgical Site Infection Prevention, Anesthesia Post-op Instructions and Care and Recovery After Surgery       Total Knee Replacement Total knee replacement is a procedure to replace the knee joint with an artificial (prosthetic) knee joint. The purpose of this surgery is to reduce knee pain and improve knee function. The prosthetic knee joint (prosthesis) may be made of metal, plastic or ceramic. It replaces parts of the thigh bone (femur), lower leg bone (tibia), and kneecap  (patella) that are removed during the procedure. Tell a health care provider about:  Any allergies you have.  All medicines you are taking, including vitamins, herbs, eye drops, creams, and over-the-counter medicines.  Any problems you or family members have had with anesthetic medicines.  Any blood disorders you have.  Any surgeries you have had.  Any medical conditions you have.  Whether you are pregnant or may be pregnant. What are the risks? Generally, this is a safe procedure. However, problems may occur, including:  Infection.  Bleeding.  Allergic reactions to medicines.  Damage to other structures or organs.  Decreased range of motion of the knee.  Instability of the knee.  Loosening of the prosthetic joint.  Knee pain that does not go away (chronic pain). What happens before the procedure? Staying hydrated Follow instructions from your health care provider about hydration, which may include:  Up to 2 hours before the procedure - you may continue to drink clear liquids, such as water, clear fruit juice, black coffee, and plain tea.  Eating and drinking restrictions Follow instructions from your health care provider about eating and drinking, which may include:  8 hours before the procedure - stop eating heavy meals or foods such as meat, fried foods, or fatty foods.  6 hours before the procedure - stop  eating light meals or foods, such as toast or cereal.  6 hours before the procedure - stop drinking milk or drinks that contain milk.  2 hours before the procedure - stop drinking clear liquids. Medicines  Ask your health care provider about: ? Changing or stopping your regular medicines. This is especially important if you are taking diabetes medicines or blood thinners. ? Taking medicines such as aspirin and ibuprofen. These medicines can thin your blood. Do not take these medicines before your procedure if your health care provider instructs you not  to.  You may be given antibiotic medicine to help prevent infection. General instructions  Have dental care and routine cleanings completed before your procedure. Plan to not have dental work done for 3 months after your procedure. Germs from anywhere in your body, including your mouth, can travel to your new joint and infect it.  Ask your health care provider how your surgical site will be marked or identified.  If your health care provider prescribes physical therapy, do exercises as instructed.  Do not use any tobacco products, such as cigarettes, chewing tobacco, or e-cigarettes. If you need help quitting, ask your health care provider.  You may have a physical exam.  You may have tests, such as: ? X-rays. ? MRI. ? CT scan. ? Bone scans.  You may have a blood or urine sample taken.  Plan to have someone take you home after the procedure.  If you will be going home right after the procedure, plan to have someone with you for at least 24 hours. It is recommended that you have someone to help care for you for at least 4-6 weeks after your procedure. What happens during the procedure?   To reduce your risk of infection: ? Your health care team will wash or sanitize their hands. ? Your skin will be washed with soap.  An IV tube will be inserted into one of your veins.  You will be given one or more of the following: ? A medicine to help you relax (sedative). ? A medicine to numb the area (local anesthetic). ? A medicine to make you fall asleep (general anesthetic). ? A medicine that is injected into your spine to numb the area below and slightly above the injection site (spinal anesthetic). ? A medicine that is injected into an area of your body to numb everything below the injection site (regional anesthetic).  An incision will be made in your knee.  Damaged cartilage and bone will be removed from your femur, tibia, and patella.  Parts of the prosthesis (liners) will be  placed over the areas of bone and cartilage that were removed. A metal liner will be placed over your femur, and plastic liners will be placed over your tibia and the underside of your patella.  One or more small tubes (drains) may be placed near your incision to help drain extra fluid from your surgical site.  Your incision will be closed with stitches (sutures), skin glue, or adhesive strips. Medicine may be applied to your incision.  A bandage (dressing) will be placed over your incision. The procedure may vary among health care providers and hospitals. What happens after the procedure?  Your blood pressure, heart rate, breathing rate, and blood oxygen level will be monitored often until the medicines you were given have worn off.  You may continue to receive fluids and medicines through an IV tube.  You will have some pain. Pain medicines will be available to  help you.  You may have fluid coming from one or more drains in your incision.  You may have to wear compression stockings. These stockings help to prevent blood clots and reduce swelling in your legs.  You will be encouraged to move around as much as possible.  You may be given a continuous passive motion machine to use at home. You will be shown how to use this machine.  Do not drive for 24 hours if you received a sedative. This information is not intended to replace advice given to you by your health care provider. Make sure you discuss any questions you have with your health care provider. Document Released: 03/07/2001 Document Revised: 05/19/2017 Document Reviewed: 11/05/2015 Elsevier Interactive Patient Education  2019 [place].  Total Knee Replacement, Care After Refer to this sheet in the next few weeks. These instructions provide you with information about caring for yourself after your procedure. Your health care provider may also give you more specific instructions. Your treatment has been planned according to  current medical practices, but problems sometimes occur. Call your health care provider if you have any problems or questions after your procedure. What can I expect after the procedure? After the procedure, it is common to have:  Pain and swelling.  A small amount of blood or clear fluid coming from your incision.  Limited range of motion. Follow these instructions at home: Medicines  Take over-the-counter and prescription medicines only as told by your health care provider.  If you were prescribed an antibiotic medicine, take it as told by your health care provider. Do not stop taking the antibiotic even if you start to feel better.  If you were prescribed a blood thinner (anticoagulant), take it as told by your health care provider. Bathing  Do not take baths, swim, or use a hot tub until your health care provider approves. Ask your health care provider if you can take showers. You may only be allowed to take sponge baths for bathing.  If you have an immobilizer that is not waterproof, cover it with a watertight covering when you take a bath or shower.  Keep your bandage (dressing) dry until your health care provider says it can be removed. Incision care and drain care   Check your incision area and drain site every day for signs of infection. Check for: ? More redness, swelling, or pain. ? More fluid or blood. ? Warmth. ? Pus or a bad smell.  Follow instructions from your health care provider about how to take care of your incision. Make sure you: ? Wash your hands with soap and water before you change your dressing. If soap and water are not available, use alcohol-based hand sanitizer. ? Change your dressing as told by your health care provider. ? Leave stitches (sutures), skin glue, or adhesive strips in place. These skin closures may need to stay in place for 2 weeks or longer. If adhesive strip edges start to loosen and curl up, you may trim the loose edges. Do not remove  adhesive strips completely unless your health care provider tells you to do that.  If you have a drain, follow instructions from your health care provider about caring for it. Do not remove the drain tube or any dressings around the tube opening unless your health care provider approves. Managing pain, stiffness, and swelling      If directed, put ice on your knee. ? Put ice in a plastic bag or use the icing device (  cold flow pad or cryocuff) that you were given. Follow instructions from your health care provider about how to use the icing device. ? Place a towel between your skin and the bag or between your skin and the icing device. ? Leave the ice on for 20 minutes, 2-3 times per day.  If directed, apply heat to the affected area as often as told by your health care provider. Use the heat source that your health care provider recommends, such as a moist heat pack or a heating pad. ? Place a towel between your skin and the heat source. ? Leave the heat on for 20-30 minutes. ? Remove the heat if your skin turns bright red. This is especially important if you are unable to feel pain, heat, or cold. You may have a greater risk of getting burned.  Move your toes often to avoid stiffness and to lessen swelling.  Raise (elevate) your knee above the level of your heart while you are sitting or lying down.  Wear elastic knee support for as long as told by your health care provider. Driving   Do not drive until your health care provider approves. Ask your health care provider when it is safe to drive if you have an immobilizer on your knee.  Do not drive or operate heavy machinery while taking prescription pain medicine.  Do not drive for 24 hours if you received a sedative. Activity  Do not play contact sports until your health care provider approves.  Avoid high-impact activities, including running, jumping rope, and jumping jacks.  Avoid sitting for a long time without moving. Get up  and move around at least every few hours.  If physical therapy was prescribed, do exercises as told by your health care provider.  Return to your normal activities as told by your health care provider. Ask your health care provider what activities are safe for you. Safety  Do not use your leg to support your body weight until your health care provider approves. Use crutches or a walker as told by your health care provider. General instructions  Do not have any dental work done for at least 3 months after your surgery. When you do have dental work done, tell your dentist about your joint replacement.  Do not use any tobacco products, such as cigarettes, chewing tobacco, or e-cigarettes. If you need help quitting, ask your health care provider.  Wear compression stockings as told by your health care provider. These stockings help to prevent blood clots and reduce swelling in your legs.  If you have been sent home with a continuous passive motion machine, use it as told by your health care provider.  Drink enough fluid to keep your urine clear or pale yellow.  If you have been instructed to lose weight, follow instructions from your health care provider about how to do this safely.  Keep all follow-up visits as told by your health care provider. This is important. Contact a health care provider if:  You have more redness, swelling, or pain around your incision or drain.  You have more fluid or blood coming from your incision or drain.  Your incision or drain site feels warm to the touch.  You have pus or a bad smell coming from your incision or drain.  You have a fever.  Your incision breaks open after your health care provider removes your sutures, skin glue, or adhesive tape.  Your prosthesis feels loose.  You have knee pain that does  not go away. Get help right away if:  You have a rash.  You have pain or swelling in your calf or thigh.  You have shortness of breath or  difficulty breathing.  You have chest pain.  Your range of motion in your knee is getting worse. This information is not intended to replace advice given to you by your health care provider. Make sure you discuss any questions you have with your health care provider. Document Released: 06/18/2005 Document Revised: 04/24/2017 Document Reviewed: 11/05/2015 Elsevier Interactive Patient Education  2019 [place].  Spinal Anesthesia and Epidural Anesthesia Spinal anesthesia and epidural anesthesia are methods of numbing the body. They are done by injecting numbing medicine (anesthetic) into the back, near the spinal cord. Spinal anesthesia is usually done to numb an area at and below the place where the injection is made. It is often used during surgeries of the pelvis, hips, legs, and lower abdomen. It begins to work almost immediately. Epidural anesthesia may be done to numb an area above or below the area where the injection is made. It is often used during childbirth and after major abdominal or chest surgery. It begins to work after 10-20 minutes. Tell a health care provider about:  Any allergies you have.  All medicines you are taking, including vitamins, herbs, eye drops, creams, and over-the-counter medicines.  Any problems you or family members have had with anesthetic medicines.  Any blood disorders you have.  Any surgeries you have had.  Any medical conditions you have.  Whether you are pregnant or may be pregnant.  Any recent alcohol, tobacco, or drug use. What are the risks? Generally, this is a safe procedure. However, problems may occur, including:  Headache.  A drop in blood pressure. In some cases, this can lead to a heart attack or a stroke.  Nerve damage.  Infection.  Allergic reaction to medicines.  Seizures.  Spinal fluid leak.  Bleeding around the injection site.  Inability to breathe. If this happens, a tube may be put into your windpipe  (trachea) and a machine may be used to help you breathe until the anesthetic wears off.  Long-lasting numbness, pain, or loss of function of body parts.  Nausea and vomiting.  Dizziness and fainting.  Shivering.  Itching. What happens before the procedure? Staying hydrated Follow instructions from your health care provider about hydration, which may include:  Up to 2 hours before the procedure - you may continue to drink clear liquids, such as water, clear fruit juice, black coffee, and plain tea.  Eating and drinking restrictions Follow instructions from your health care provider about eating and drinking, which may include:  8 hours before the procedure - stop eating heavy meals or foods such as meat, fried foods, or fatty foods.  6 hours before the procedure - stop eating light meals or foods, such as toast or cereal.  6 hours before the procedure - stop drinking milk or drinks that contain milk.  2 hours before the procedure - stop drinking clear liquids. Medicine Ask your health care provider about:  Changing or stopping your regular medicines. This is especially important if you are taking diabetes medicines or blood thinners.  Changing or stopping your dietary supplements.  Taking medicines such as aspirin and ibuprofen. These medicines can thin your blood. Do not take these medicines before your procedure if your health care provider instructs you not to. General instructions   Do not use any tobacco products, such as cigarettes,  chewing tobacco, and electronic cigarettes, for as long as possible before your procedure. If you need help quitting, ask your health care provider.  If you use a sleep apnea device, ask your health care provider whether you should bring it with you on the day of your surgery.  Ask your health care provider if you will have to stay overnight at the hospital or clinic.  If you will not have to stay overnight: ? Plan to have someone take  you home from the hospital or clinic. ? Plan to have a responsible adult care for you for at least 24 hours after you leave the hospital or clinic. This is important. What happens during the procedure?   A health care provider will put patches on your chest, a cuff around your arm, or a sensor device on your finger. These will be attached to monitors that allow your health care provider to watch your blood pressure, pulse, and oxygen levels to make sure that the anesthetic does not cause any problems.  An IV tube may be inserted into one of your veins. The tube will be used to give you fluids and medicines throughout the procedure as needed.  You may be given a medicine to help you relax (sedative).  You will be asked to sit up or to lie on your side with your knees and your chin bent toward your chest. These positions open up the space between the bones in your back, making it easier to inject the medicine.  The area of your back where the medicine will be injected will be cleaned.  A medicine called a local anesthetic may be injected to numb the area where the spinal or epidural anesthetic will be injected.  A needle will be inserted between the bones of your back. While this is being done: ? Continue to breathe normally. ? Stay as still and quiet as you can. ? If you feel a tingling shock or pain going down your leg, tell your health care provider but try not to move.  The spinal or epidural anesthetic will be injected.  If you receive an epidural anesthetic and need more than one dose, a tiny, flexible tube (catheter) will be placed where the anesthetic was injected. Additional doses will be given through the catheter. If you need pain medicine after the procedure, the catheter will be kept in place.  If an epidural catheter has not been placed in your injection site or left there, a small bandage (dressing) will be placed over the injection site. The procedure may vary among health  care providers and hospitals. What happens after the procedure?  You will need to stay in bed until your health care provider says it safe for you to walk.  Your blood pressure, heart rate, breathing rate, and blood oxygen level will be monitored until the medicines you were given have worn off.  If you have a catheter, it will be removed when it is no longer needed.  Do not drive for 24 hours if you received a sedative.  It is common to have nausea and itching. There are medicines that can help with these side effects. It is also common to have: ? Sleepiness. ? Vomiting. ? Numbness or tingling in your legs. ? Trouble urinating. Summary  Spinal anesthesia and epidural anesthesia are methods of numbing the body.  Tell your health care provider about any allergies or medical problems you have, any problems you have had with anesthetic medicines, any blood  disorders you have, and whether you are or may be pregnant.  If you use a sleep apnea device, ask your health care provider whether you should bring it with you on the day of your surgery.  The spinal or epidural anesthetic will be injected through the space between the bones in your back.  Do not drive for 24 hours if you received a sedative. This information is not intended to replace advice given to you by your health care provider. Make sure you discuss any questions you have with your health care provider. Document Released: 02/19/2004 Document Revised: 07/13/2017 Document Reviewed: 03/22/2016 Elsevier Interactive Patient Education  2019 [place].  Spinal Anesthesia and Epidural Anesthesia, Care After This sheet gives you information about how to care for yourself after your procedure. Your doctor may also give you more specific instructions. If you have problems or questions, call your doctor. Follow these instructions at home: For at least 24 hours after the procedure:   Have a responsible adult stay with you. It is  important to have someone help care for you until you are awake and alert.  Rest as needed.  Do not do activities where you could fall or get hurt (injured).  Do not drive.  Do not use heavy machinery.  Do not drink alcohol.  Do not take sleeping pills or medicines that make you sleepy (drowsy).  Do not make important decisions.  Do not sign legal documents.  Do not take care of children on your own. Eating and drinking  If you throw up (vomit), drink water, juice, or soup when nausea and vomiting stop.  Drink enough fluid to keep your pee (urine) pale yellow.  Make sure you do not feel like throwing up (nauseous) before you eat solid foods.  Follow the diet that your doctor recommends. General instructions  Return to your normal activities as told by your doctor. Ask your doctor what activities are safe for you.  Take over-the-counter and prescription medicines only as told by your doctor.  If you have sleep apnea, surgery and certain medicines can raise your risk for breathing problems. Follow instructions from your doctor about when to wear your sleep device. Your doctor may tell you to wear your sleep device: ? Anytime you are sleeping, including during daytime naps. ? While taking prescription pain medicines, sleeping pills, or medicines that make you sleepy.  Do not use any products that contain nicotine or tobacco. This includes cigarettes and e-cigarettes. ? If you need help quitting, ask your doctor. ? If you smoke, do not smoke by yourself. Make sure someone is nearby in case you need help.  Keep all follow-up visits as told by your doctor. This is important. Contact a doctor if:  It has been more than one day since your procedure and you feel like throwing up.  It has been more than one day since your procedure and you throw up.  You have a rash. Get help right away if:  You have a fever.  You have a headache that lasts a long time.  You have a very  bad headache.  Your vision is blurry.  You see two of a single object (double vision).  You are dizzy or light-headed.  You faint.  Your arms or legs tingle, feel weak, or get numb.  You have trouble breathing.  You cannot pee (urinate). Summary  After the procedure, have a responsible adult stay with you at home until you are fully awake and  alert.  Do not do activities that might get you injured. Do not drive, use heavy machinery, drink alcohol, or make important decisions for 24 hours after the procedure.  Take medicines as told by your doctor. Do not use products that contain nicotine or tobacco.  Get help right away if you have a fever, blurry vision, difficulty breathing or passing urine, or weakness or numbness in arms or legs. This information is not intended to replace advice given to you by your health care provider. Make sure you discuss any questions you have with your health care provider. Document Released: 03/22/2016 Document Revised: 07/13/2017 Document Reviewed: 03/22/2016 Elsevier Interactive Patient Education  2019 [place] Anesthesia, Adult General anesthesia is the use of medicines to make a person "go to sleep" (unconscious) for a medical procedure. General anesthesia must be used for certain procedures, and is often recommended for procedures that:  Last a long time.  Require you to be still or in an unusual position.  Are major and can cause blood loss. The medicines used for general anesthesia are called general anesthetics. As well as making you unconscious for a certain amount of time, these medicines:  Prevent pain.  Control your blood pressure.  Relax your muscles. Tell a health care provider about:  Any allergies you have.  All medicines you are taking, including vitamins, herbs, eye drops, creams, and over-the-counter medicines.  Any problems you or family members have had with anesthetic medicines.  Types of anesthetics  you have had in the past.  Any blood disorders you have.  Any surgeries you have had.  Any medical conditions you have.  Any recent upper respiratory, chest, or ear infections.  Any history of: ? Heart or lung conditions, such as heart failure, sleep apnea, asthma, or chronic obstructive pulmonary disease (COPD). ? Armed forces logistics/support/administrative officer. ? Depression or anxiety.  Any tobacco or drug use, including marijuana or alcohol use.  Whether you are pregnant or may be pregnant. What are the risks? Generally, this is a safe procedure. However, problems may occur, including:  Allergic reaction.  Lung and heart problems.  Inhaling food or liquid from the stomach into the lungs (aspiration).  Nerve injury.  Dental injury.  Air in the bloodstream, which can lead to stroke.  Extreme agitation or confusion (delirium) when you wake up from the anesthetic.  Waking up during your procedure and being unable to move. This is rare. These problems are more likely to develop if you are having a major surgery or if you have an advanced or serious medical condition. You can prevent some of these complications by answering all of your health care provider's questions thoroughly and by following all instructions before your procedure. General anesthesia can cause side effects, including:  Nausea or vomiting.  A sore throat from the breathing tube.  Hoarseness.  Wheezing or coughing.  Shaking chills.  Tiredness.  Body aches.  Anxiety.  Sleepiness or drowsiness.  Confusion or agitation. What happens before the procedure? Staying hydrated Follow instructions from your health care provider about hydration, which may include:  Up to 2 hours before the procedure - you may continue to drink clear liquids, such as water, clear fruit juice, black coffee, and plain tea.  Eating and drinking restrictions Follow instructions from your health care provider about eating and drinking, which may  include:  8 hours before the procedure - stop eating heavy meals or foods such as meat, fried foods, or fatty foods.  6 hours  before the procedure - stop eating light meals or foods, such as toast or cereal.  6 hours before the procedure - stop drinking milk or drinks that contain milk.  2 hours before the procedure - stop drinking clear liquids. Medicines Ask your health care provider about:  Changing or stopping your regular medicines. This is especially important if you are taking diabetes medicines or blood thinners.  Taking medicines such as aspirin and ibuprofen. These medicines can thin your blood. Do not take these medicines unless your health care provider tells you to take them.  Taking over-the-counter medicines, vitamins, herbs, and supplements. Do not take these during the week before your procedure unless your health care provider approves them. General instructions  Starting 3-6 weeks before the procedure, do not use any products that contain nicotine or tobacco, such as cigarettes and e-cigarettes. If you need help quitting, ask your health care provider.  If you brush your teeth on the morning of the procedure, make sure to spit out all of the toothpaste.  Tell your health care provider if you become ill or develop a cold, cough, or fever.  If instructed by your health care provider, bring your sleep apnea device with you on the day of your surgery (if applicable).  Ask your health care provider if you will be going home the same day, the following day, or after a longer hospital stay. ? Plan to have someone take you home from the hospital or clinic. ? Plan to have a responsible adult care for you for at least 24 hours after you leave the hospital or clinic. This is important. What happens during the procedure?   You will be given anesthetics through both of the following: ? A mask placed over your nose and mouth. ? An IV in one of your veins.  You may receive a  medicine to help you relax (sedative).  After you are unconscious, a breathing tube may be inserted down your throat to help you breathe. This will be removed before you wake up.  An anesthesia specialist will stay with you throughout your procedure. He or she will: ? Keep you comfortable and safe by continuing to give you medicines and adjusting the amount of medicine that you get. ? Monitor your blood pressure, pulse, and oxygen levels to make sure that the anesthetics do not cause any problems. The procedure may vary among health care providers and hospitals. What happens after the procedure?  Your blood pressure, temperature, heart rate, breathing rate, and blood oxygen level will be monitored until the medicines you were given have worn off.  You will wake up in a recovery area. You may wake up slowly.  If you feel anxious or agitated, you may be given medicine to help you calm down.  If you will be going home the same day, your health care provider may check to make sure you can walk, drink, and urinate.  Your health care provider will treat any pain or side effects you have before you go home.  Do not drive for 24 hours if you were given a sedative. Summary  General anesthesia is used to keep you still and prevent pain during a procedure.  It is important to tell your health care provider about your medical history and any surgeries you have had, and previous experience with anesthesia.  Follow your health care provider's instructions about when to stop eating, drinking, or taking certain medicines before your procedure.  Plan to have someone  take you home from the hospital or clinic. This information is not intended to replace advice given to you by your health care provider. Make sure you discuss any questions you have with your health care provider. Document Released: 03/07/2008 Document Revised: 04/18/2018 Document Reviewed: 07/15/2017 Elsevier Interactive Patient Education   2019 [employer] Anesthesia, Adult, Care After This sheet gives you information about how to care for yourself after your procedure. Your health care provider may also give you more specific instructions. If you have problems or questions, contact your health care provider. What can I expect after the procedure? After the procedure, the following side effects are common:  Pain or discomfort at the IV site.  Nausea.  Vomiting.  Sore throat.  Trouble concentrating.  Feeling cold or chills.  Weak or tired.  Sleepiness and fatigue.  Soreness and body aches. These side effects can affect parts of the body that were not involved in surgery. Follow these instructions at home:  For at least 24 hours after the procedure:  Have a responsible adult stay with you. It is important to have someone help care for you until you are awake and alert.  Rest as needed.  Do not: ? Participate in activities in which you could fall or become injured. ? Drive. ? Use heavy machinery. ? Drink alcohol. ? Take sleeping pills or medicines that cause drowsiness. ? Make important decisions or sign legal documents. ? Take care of children on your own. Eating and drinking  Follow any instructions from your health care provider about eating or drinking restrictions.  When you feel hungry, start by eating small amounts of foods that are soft and easy to digest (bland), such as toast. Gradually return to your regular diet.  Drink enough fluid to keep your urine pale yellow.  If you vomit, rehydrate by drinking water, juice, or clear broth. General instructions  If you have sleep apnea, surgery and certain medicines can increase your risk for breathing problems. Follow instructions from your health care provider about wearing your sleep device: ? Anytime you are sleeping, including during daytime naps. ? While taking prescription pain medicines, sleeping medicines, or medicines that make  you drowsy.  Return to your normal activities as told by your health care provider. Ask your health care provider what activities are safe for you.  Take over-the-counter and prescription medicines only as told by your health care provider.  If you smoke, do not smoke without supervision.  Keep all follow-up visits as told by your health care provider. This is important. Contact a health care provider if:  You have nausea or vomiting that does not get better with medicine.  You cannot eat or drink without vomiting.  You have pain that does not get better with medicine.  You are unable to pass urine.  You develop a skin rash.  You have a fever.  You have redness around your IV site that gets worse. Get help right away if:  You have difficulty breathing.  You have chest pain.  You have blood in your urine or stool, or you vomit blood. Summary  After the procedure, it is common to have a sore throat or nausea. It is also common to feel tired.  Have a responsible adult stay with you for the first 24 hours after general anesthesia. It is important to have someone help care for you until you are awake and alert.  When you feel hungry, start by eating small amounts of foods  that are soft and easy to digest (bland), such as toast. Gradually return to your regular diet.  Drink enough fluid to keep your urine pale yellow.  Return to your normal activities as told by your health care provider. Ask your health care provider what activities are safe for you. This information is not intended to replace advice given to you by your health care provider. Make sure you discuss any questions you have with your health care provider. Document Released: 03/07/2001 Document Revised: 07/15/2017 Document Reviewed: 07/15/2017 Elsevier Interactive Patient Education  2019 [employer].

## 2018-12-15 ENCOUNTER — Encounter: Payer: Self-pay | Admitting: Allergy

## 2018-12-15 ENCOUNTER — Ambulatory Visit (INDEPENDENT_AMBULATORY_CARE_PROVIDER_SITE_OTHER): Payer: Federal, State, Local not specified - PPO | Admitting: Allergy

## 2018-12-15 VITALS — BP 102/58 | HR 85 | Temp 97.9°F | Resp 16 | Ht 70.5 in | Wt 271.0 lb

## 2018-12-15 DIAGNOSIS — J310 Chronic rhinitis: Secondary | ICD-10-CM

## 2018-12-15 DIAGNOSIS — L2089 Other atopic dermatitis: Secondary | ICD-10-CM

## 2018-12-15 DIAGNOSIS — R05 Cough: Secondary | ICD-10-CM

## 2018-12-15 DIAGNOSIS — J988 Other specified respiratory disorders: Secondary | ICD-10-CM

## 2018-12-15 DIAGNOSIS — R059 Cough, unspecified: Secondary | ICD-10-CM

## 2018-12-15 MED ORDER — MONTELUKAST SODIUM 10 MG PO TABS: 10.0000 mg | ORAL_TABLET | Freq: Every day | ORAL | 5 refills | Status: DC

## 2018-12-15 MED ORDER — CRISABOROLE 2 % EX OINT: 1.0000 "application " | TOPICAL_OINTMENT | Freq: Two times a day (BID) | CUTANEOUS | 5 refills | Status: DC | PRN

## 2018-12-15 MED ORDER — ALBUTEROL SULFATE HFA 108 (90 BASE) MCG/ACT IN AERS: 2.0000 | INHALATION_SPRAY | Freq: Four times a day (QID) | RESPIRATORY_TRACT | 2 refills | Status: DC | PRN

## 2018-12-15 NOTE — Progress Notes (Signed)
New Patient Note  RE: [name] MRN: [id number] DOB: [birth year] Date of Office Visit: 12/15/2018  Referring provider: Carole Civil, MD Primary care provider: [name], MD  Chief Complaint: rash  History of present illness: [name] is a 58 y.o. male presenting today for consultation for cough/congestion and upper respiratory infections.    He states he has been having episodes of URIs treated with antibiotics.  He reports cough, fever, congestion primarily.  States the symptoms never seem to go away even with antibiotics.  But feels like he is having a least an infection once a month.  He does report being treated with steroids as well with the antibiotics.  Reports cough is sometimes dry and sometimes productive.  He also reports SOB and occasional chest tightness.  No wheezing.  No breathing treatments or inhaler use previously.  UTD with vaccines.  He denies any ear infections or sinus infections as an adult.  Several years ago he reports he did have a skin abscess treated as outpatient.    He states he has been having an intermittent rash mostly on his earlobes that is itchy and flakey.  He has tried vitamin E oil on the ears.      He denies history eczema or food allergy.    He does report sneezing and some nasal drainage.  Year-round symptoms.  He will take claritin daily and he states he can tell a difference when he doesn't take it.  He believes he has been taking claritin for the past 4 years.  He has used flonase in the past which hasn't helped much.    He has OSA and is using a CPAP.    Review of systems: Review of Systems  Constitutional: Positive for fever. Negative for chills and malaise/fatigue.  HENT: Positive for congestion. Negative for ear discharge, ear pain, nosebleeds, sinus pain and sore throat.   Eyes: Negative for pain, discharge and redness.  Respiratory: Positive for cough, sputum production and shortness of breath. Negative  for hemoptysis and wheezing.   Cardiovascular: Negative for chest pain.  Gastrointestinal: Negative for abdominal pain, constipation, diarrhea, heartburn, nausea and vomiting.  Musculoskeletal: Negative for joint pain.  Skin: Positive for itching and rash.  Neurological: Negative for headaches.    All other systems negative unless noted above in HPI  Past medical history: Past Medical History:  Diagnosis Date  . Arthritis   . CAD (coronary artery disease)    Multivessel disease status post CABG 08/2015  . CKD (chronic kidney disease), stage II   . Essential hypertension   . Hyperlipidemia   . Hypertension   . OSA on CPAP   . Type 2 diabetes mellitus (HCC)     Past surgical history: Past Surgical History:  Procedure Laterality Date  . APPENDECTOMY    . Biceps tendon surgery Right   . CARDIAC CATHETERIZATION N/A 08/25/2015   Procedure: Left Heart Cath and Coronary Angiography;  Surgeon: Belva Crome, MD;  Location: [facility];  Service: Cardiovascular;  Laterality: N/A;  . CORONARY ARTERY BYPASS GRAFT N/A 08/29/2015   Procedure: CORONARY ARTERY BYPASS GRAFTING (CABG);  Surgeon: [name], MD;  Location: [facility];  Service: Open Heart Surgery;  Laterality: N/A;  . KNEE ARTHROSCOPY Left   . TEE WITHOUT CARDIOVERSION N/A 08/29/2015   Procedure: TRANSESOPHAGEAL ECHOCARDIOGRAM (TEE);  Surgeon: [name], MD;  Location: [facility];  Service: Open Heart Surgery;  Laterality: N/A;  . TOTAL  KNEE ARTHROPLASTY Left 03/09/2017   Procedure: TOTAL KNEE ARTHROPLASTY;  Surgeon: Carole Civil, MD;  Location: [facility];  Service: Orthopedics;  Laterality: Left;    Family history:  Family History  Problem Relation Age of Onset  . Arthritis Other   . Cancer Other   . Diabetes Other   . CAD Father   . Diabetes Mellitus II Father   . CAD Brother   . Diabetes Mellitus II Brother   . Anesthesia problems Neg Hx   . Hypotension Neg Hx   . Malignant hyperthermia Neg Hx   .  Pseudochol deficiency Neg Hx     Social history: Lives in a home with carpeting with gas heating and central cooling.  No pets in the home.  No concern for water damage, mildew or roaches in the home.  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  Denies smoking history.   Medication List: Allergies as of 12/15/2018   No Known Allergies     Medication List       Accurate as of December 15, 2018  4:02 PM. Always use your most recent med list.        aspirin EC 81 MG tablet Take 81 mg by mouth at bedtime.   diphenhydrAMINE 50 MG tablet Commonly known as:  BENADRYL Take 1 tablet (50 mg total) by mouth at bedtime as needed for itching.   ezetimibe 10 MG tablet Commonly known as:  ZETIA Take 1 tablet (10 mg total) by mouth daily.   FLAXSEED OIL PO Take 500 mg by mouth daily.   furosemide 20 MG tablet Commonly known as:  LASIX Take 1 tablet (20 mg total) by mouth daily.   hydrochlorothiazide 25 MG tablet Commonly known as:  HYDRODIURIL Take 12.5 mg by mouth daily.   insulin aspart 100 UNIT/ML injection Commonly known as:  novoLOG Inject 0.4-35 Units into the skin 3 (three) times daily before meals.   insulin lispro protamine-lispro (75-25) 100 UNIT/ML Susp injection Commonly known as:  HUMALOG 75/25 MIX Inject 35-70 Units into the skin 3 (three) times daily.   KOMBIGLYZE XR 2.04-999 MG Tb24 Generic drug:  Saxagliptin-Metformin Take 1 tablet by mouth 2 (two) times daily.   loratadine 10 MG tablet Commonly known as:  CLARITIN Take 10 mg by mouth daily.   methocarbamol 500 MG tablet Commonly known as:  ROBAXIN TAKE 1 TABLET(500 MG) BY MOUTH THREE TIMES DAILY   metoprolol tartrate 25 MG tablet Commonly known as:  LOPRESSOR TAKE 1/2 TABLET(12.5 MG) BY MOUTH TWICE DAILY   MULTIVITAMIN ADULT PO Take 1 tablet by mouth daily.   omeprazole 40 MG capsule Commonly known as:  PRILOSEC Take 40 mg by mouth at bedtime.   pravastatin 20 MG tablet Commonly known as:   PRAVACHOL TAKE 1 TABLET(20 MG) BY MOUTH AT BEDTIME   traMADol-acetaminophen 37.5-325 MG tablet Commonly known as:  ULTRACET Take 1 tablet by mouth every 4 (four) hours as needed. for pain   TRESIBA FLEXTOUCH 200 UNIT/ML Sopn Generic drug:  Insulin Degludec Inject 60-70 Units into the skin See admin instructions. Inject 70 units subcutaneously in the morning and 60 units in the evening   TURMERIC CURCUMIN PO Take 1,000 mg by mouth daily.       Known medication allergies: No Known Allergies   Physical examination: Blood pressure (!) 102/58, pulse 85, temperature 97.9 F (36.6 C), temperature source Oral, resp. rate 16, height 5' 10.5" (1.791 m), weight 271 lb (122.9 kg), SpO2 97 %.  General:  Alert, interactive, in no acute distress. HEENT: PERRLA,  TMs pearly gray, turbinates mildly edematous without discharge, post-pharynx non erythematous. Neck: Supple without lymphadenopathy. Lungs: Clear to auscultation without wheezing, rhonchi or rales. {no increased work of breathing. CV: Normal S1, S2 without murmurs. Abdomen: Nondistended, nontender. Skin: Dry, erythematous, excoriated patches on the pinna b/l. Extremities:  Glucose sensor disk on right arm, No clubbing, cyanosis or edema. Neuro:   Grossly intact.  Diagnositics/Labs:  Spirometry: FEV1: 2.6L 65%, FVC: 3.21L 67%.  Lung function is reduced and shows a restrictive pattern.  He did have not improvement after albuterol neb.    Allergy testing: did not perform due to reduced lung function  Assessment and plan:   Recurrent respiratory infections with cough component  - due to degree of infections you are having and history of skin abscess will screen your immune system with immunocompetence work-up.  Labs to be obtained: CBC w diff, CMP, immunoglobulins and vaccine titers.  Next steps will be pending lab results  - cough is significant component of these infections.  Spirometry today shows reduced lung function.    - have  access to albuterol inhaler for as needed use for relief of symptoms of cough, shortness of breath, chest tightness or wheeze.  Use 2 puffs every 4-6 hours as needed and monitor frequency of use.    - start singulair 10mg  daily - take at bedtime  - will obtain environmental allergen panel via blood work  - will have low threshold to initiate ICS or ICS/LABA to see if this will improve symptom and lung function   Rhinitis, presumed allergic  - will obtain environmental allergen panel via blood work  - allergen avoidance measures discussed/handouts provided  - since you have been on claritin for years recommend changing to either zyrtec 10mg , allegra 180mg  or xyzal 5mg  to determine if more effective.   - singulair can also be effective in managing allergy symptoms  Eczema  - rash of the ear lobes appears eczematous in nature  - will provide with Eucrisa, non-steroidal eczema cream, that can be applied in thin layer twice a day itchy/patchy/dry/scaly areas.  Can use a qtip to apply Eucrisa to the earlobes.    - Vaseline can be used as moisturizer for the ears  Follow-up 3-4 months or sooner if needed  I appreciate the opportunity to take part in Zigmond's care. Please do not hesitate to contact me with questions.  Sincerely,   Prudy Feeler, MD Allergy/Immunology [facility]

## 2018-12-15 NOTE — Patient Instructions (Addendum)
Recurrent respiratory infections with cough component  - due to degree of infections you are having and history of skin abscess will screen your immune system with immunocompetence work-up.  Labs to be obtained: CBC w diff, CMP, immunoglobulins and vaccine titers.  Next steps will be pending lab results  - cough is significant component of these infections.  Spirometry today shows reduced lung function.    - have access to albuterol inhaler for as needed use for relief of symptoms of cough, shortness of breath, chest tightness or wheeze.  Use 2 puffs every 4-6 hours as needed and monitor frequency of use.    - start singulair 10mg  daily - take at bedtime  - will obtain environmental allergen panel via blood work  Allergies  - will obtain environmental allergen panel via blood work  - allergen avoidance measures discussed/handouts provided  - since you have been on claritin for years recommend changing to either zyrtec 10mg , allegra 180mg  or xyzal 5mg  to determine if more effective.   - singulair can also be effective in managing allergy symptoms  Eczema  - rash of the ear lobes appears eczematous in nature  - will provide with Eucrisa, non-steroidal eczema cream, that can be applied in thin layer twice a day itchy/patchy/dry/scaly areas.  Can use a qtip to apply Eucrisa to the earlobes.    - Vaseline can be used as moisturizer for the ears  Follow-up 3-4 months or sooner if needed

## 2018-12-18 ENCOUNTER — Other Ambulatory Visit: Payer: Self-pay | Admitting: Orthopedic Surgery

## 2018-12-18 ENCOUNTER — Telehealth: Payer: Self-pay | Admitting: *Deleted

## 2018-12-18 DIAGNOSIS — M1711 Unilateral primary osteoarthritis, right knee: Secondary | ICD-10-CM

## 2018-12-18 NOTE — Telephone Encounter (Signed)
Submitted PA via covermymeds. PA pending.

## 2018-12-19 ENCOUNTER — Other Ambulatory Visit: Payer: Self-pay | Admitting: Allergy

## 2018-12-19 DIAGNOSIS — Z23 Encounter for immunization: Secondary | ICD-10-CM | POA: Diagnosis not present

## 2018-12-19 DIAGNOSIS — G4733 Obstructive sleep apnea (adult) (pediatric): Secondary | ICD-10-CM | POA: Diagnosis not present

## 2018-12-19 MED ORDER — PIMECROLIMUS 1 % EX CREA: TOPICAL_CREAM | Freq: Two times a day (BID) | CUTANEOUS | 5 refills | Status: DC

## 2018-12-19 NOTE — Telephone Encounter (Signed)
Prescription has been sent in. I will await approval/denial from insurance.

## 2018-12-19 NOTE — Telephone Encounter (Signed)
It is possible.  Try for elidel.

## 2018-12-19 NOTE — Telephone Encounter (Signed)
Patient's insurance has denied coverage of Eucrisa. No alternatives given. Possibility they may cover elidel?

## 2018-12-19 NOTE — Addendum Note (Signed)
Addended by: Lucrezia Starch I on: 12/19/2018 12:11 PM   Modules accepted: Orders

## 2018-12-20 ENCOUNTER — Telehealth: Payer: Self-pay | Admitting: *Deleted

## 2018-12-20 ENCOUNTER — Telehealth: Payer: Self-pay | Admitting: Orthopedic Surgery

## 2018-12-20 ENCOUNTER — Telehealth: Payer: Self-pay | Admitting: Radiology

## 2018-12-20 DIAGNOSIS — D649 Anemia, unspecified: Secondary | ICD-10-CM

## 2018-12-20 DIAGNOSIS — R768 Other specified abnormal immunological findings in serum: Secondary | ICD-10-CM

## 2018-12-20 DIAGNOSIS — D696 Thrombocytopenia, unspecified: Secondary | ICD-10-CM

## 2018-12-20 NOTE — Telephone Encounter (Signed)
Call received from [facility] at [place], [phone]; states mutual patient who is scheduled for surgery as noted has low hemoglobin and platelets. States sent a staff message to Dr Aline Brochure also. Please advise.

## 2018-12-20 NOTE — Telephone Encounter (Signed)
Thanks see previous notes.

## 2018-12-20 NOTE — Addendum Note (Signed)
Addended by: Horris Latino on: 12/20/2018 09:46 AM   Modules accepted: Orders

## 2018-12-20 NOTE — Telephone Encounter (Signed)
-----   Message from [place], MD sent at 12/20/2018  8:57 AM EST ----- Please let patient know the following:  -Immunoglobulin panel shows very elevated IgA and mild elevation in IgG.  He does not have a deficiency of these immunoglobulins however having such elevated IgA can also pose some issues.  I will need to order additional blood work and urine study to evaluate this elevated IgA level.    -CBC shows that he is anemic however this appears to be chronic as he has had low hemoglobin levels over the past year.  His platelets (which help in clotting during bleeding) are also low and have been dropping over the past year as well.  -The anemia and low platelets coupled with the elevated IgA makes me concerned there is some underlying process going on involving the bone marrow I would like to be evaluated by hematologist/oncologist.  I have discussed this with his primary care's office and I will be placing this referral for him.  -CMP shows a very slight decrease in potassium; he may warrant potassium supplement to raise this to normal limits.  The liver enzyme AST is also slightly elevated however this has been slightly elevated over the past year.  -He has normal protective titers to tetanus and diphtheria.  Pneumococcal titers are still pending and we will call in this results back.  -Environmental allergy panel shows high IgE levels to tree pollen, low IgE levels to cat dander and dog dander.  Please provide with avoidance measures. ---------------------------------------------- He will need to come into the office to have a another blood draw to evaluate his elevated IgA levels and to have 24-hour urine studies done.  Please send labs to his PCP's office.

## 2018-12-20 NOTE — Telephone Encounter (Signed)
Sent note to cancel to [name] called patient to advise.

## 2018-12-20 NOTE — Telephone Encounter (Signed)
-----   Message from Carole Civil, MD sent at 12/20/2018 11:53 AM EST ----- Regarding: RE: recent blood test. Cancel surgery   Needs myeloma work up and hg A1c is too high  ----- Message ----- From: Horris Latino, CMA Sent: 12/20/2018   9:42 AM EST To: Carole Civil, MD Subject: recent blood test.                             Dr. Aline Brochure   Dr. Nelva Bush wanted to make you aware of [name] recent blood test. His hemoglobin is 10.1 and his platelet was 87. She noticed he has a surgery coming up and wanted to make you aware. He also had a elevated IgA and she is concerned he has myeloma. She has referred him to hematology for additional work up.   Nira Conn, CMA

## 2018-12-20 NOTE — Telephone Encounter (Signed)
Referral placed for hematologist/onocolgy for anemia, low platelets, and elevated IgA. Concern for multiple myeloma.

## 2018-12-21 ENCOUNTER — Encounter (HOSPITAL_COMMUNITY): Payer: Self-pay

## 2018-12-21 ENCOUNTER — Encounter (HOSPITAL_COMMUNITY)
Admission: RE | Admit: 2018-12-21 | Discharge: 2018-12-21 | Disposition: A | Discharge status: Home / Self Care | Payer: Federal, State, Local not specified - PPO | Source: Ambulatory Visit | Attending: Orthopedic Surgery | Admitting: Orthopedic Surgery

## 2018-12-22 ENCOUNTER — Telehealth: Payer: Self-pay | Admitting: Internal Medicine

## 2018-12-22 ENCOUNTER — Encounter: Payer: Self-pay | Admitting: Internal Medicine

## 2018-12-22 LAB — COMPREHENSIVE METABOLIC PANEL
ALT: 18 IU/L (ref 0–44)
AST: 54 IU/L — ABNORMAL HIGH (ref 0–40)
Albumin/Globulin Ratio: 1.1 — ABNORMAL LOW (ref 1.2–2.2)
Albumin: 3.9 g/dL (ref 3.5–5.5)
Alkaline Phosphatase: 66 IU/L (ref 39–117)
BUN/Creatinine Ratio: 16 (ref 9–20)
BUN: 20 mg/dL (ref 6–24)
Bilirubin Total: 0.6 mg/dL (ref 0.0–1.2)
CO2: 21 mmol/L (ref 20–29)
Calcium: 9.1 mg/dL (ref 8.7–10.2)
Chloride: 101 mmol/L (ref 96–106)
Creatinine, Ser: 1.24 mg/dL (ref 0.76–1.27)
GFR calc Af Amer: 73 mL/min/{1.73_m2} (ref >59)
GFR calc non Af Amer: 63 mL/min/{1.73_m2} (ref >59)
Globulin, Total: 3.6 g/dL (ref 1.5–4.5)
Glucose: 65 mg/dL (ref 65–99)
Potassium: 3.3 mmol/L — ABNORMAL LOW (ref 3.5–5.2)
Sodium: 140 mmol/L (ref 134–144)
Total Protein: 7.5 g/dL (ref 6.0–8.5)

## 2018-12-22 LAB — STREP PNEUMONIAE 23 SEROTYPES IGG
Pneumo Ab Type 1*: 7.1 ug/mL (ref >1.3)
Pneumo Ab Type 12 (12F)*: 0.1 ug/mL — ABNORMAL LOW (ref >1.3)
Pneumo Ab Type 14*: >17.4 ug/mL (ref >1.3)
Pneumo Ab Type 17 (17F)*: 1.2 ug/mL — ABNORMAL LOW (ref >1.3)
Pneumo Ab Type 19 (19F)*: 7 ug/mL (ref >1.3)
Pneumo Ab Type 2*: <0.1 ug/mL — ABNORMAL LOW (ref >1.3)
Pneumo Ab Type 20*: 1.3 ug/mL — ABNORMAL LOW (ref >1.3)
Pneumo Ab Type 22 (22F)*: 0.4 ug/mL — ABNORMAL LOW (ref >1.3)
Pneumo Ab Type 23 (23F)*: 0.2 ug/mL — ABNORMAL LOW (ref >1.3)
Pneumo Ab Type 26 (6B)*: 0.2 ug/mL — ABNORMAL LOW (ref >1.3)
Pneumo Ab Type 3*: 1.4 ug/mL (ref >1.3)
Pneumo Ab Type 34 (10A)*: 1.2 ug/mL — ABNORMAL LOW (ref >1.3)
Pneumo Ab Type 4*: 0.1 ug/mL — ABNORMAL LOW (ref >1.3)
Pneumo Ab Type 43 (11A)*: 0.3 ug/mL — ABNORMAL LOW (ref >1.3)
Pneumo Ab Type 5*: 3 ug/mL (ref >1.3)
Pneumo Ab Type 51 (7F)*: 1.1 ug/mL — ABNORMAL LOW (ref >1.3)
Pneumo Ab Type 54 (15B)*: 13.3 ug/mL (ref >1.3)
Pneumo Ab Type 56 (18C)*: 2.7 ug/mL (ref >1.3)
Pneumo Ab Type 57 (19A)*: 3.9 ug/mL (ref >1.3)
Pneumo Ab Type 68 (9V)*: 0.3 ug/mL — ABNORMAL LOW (ref >1.3)
Pneumo Ab Type 70 (33F)*: 1.1 ug/mL — ABNORMAL LOW (ref >1.3)
Pneumo Ab Type 8*: 1.3 ug/mL — ABNORMAL LOW (ref >1.3)
Pneumo Ab Type 9 (9N)*: 0.1 ug/mL — ABNORMAL LOW (ref >1.3)

## 2018-12-22 LAB — CBC WITH DIFFERENTIAL/PLATELET
Basophils Absolute: 0 10*3/uL (ref 0.0–0.2)
Basos: 0 %
EOS (ABSOLUTE): 0.1 10*3/uL (ref 0.0–0.4)
Eos: 1 %
Hematocrit: 30.2 % — ABNORMAL LOW (ref 37.5–51.0)
Hemoglobin: 10.1 g/dL — ABNORMAL LOW (ref 13.0–17.7)
Immature Grans (Abs): 0 10*3/uL (ref 0.0–0.1)
Immature Granulocytes: 0 %
Lymphocytes Absolute: 1.5 10*3/uL (ref 0.7–3.1)
Lymphs: 24 %
MCH: 28.1 pg (ref 26.6–33.0)
MCHC: 33.4 g/dL (ref 31.5–35.7)
MCV: 84 fL (ref 79–97)
Monocytes Absolute: 0.7 10*3/uL (ref 0.1–0.9)
Monocytes: 10 %
Neutrophils Absolute: 4.1 10*3/uL (ref 1.4–7.0)
Neutrophils: 65 %
Platelets: 87 10*3/uL — CL (ref 150–450)
RBC: 3.6 x10E6/uL — ABNORMAL LOW (ref 4.14–5.80)
RDW: 15.1 % (ref 12.3–15.4)
WBC: 6.4 10*3/uL (ref 3.4–10.8)

## 2018-12-22 LAB — ALLERGENS W/TOTAL IGE AREA 2
Alternaria Alternata IgE: <0.1 kU/L
Aspergillus Fumigatus IgE: <0.1 kU/L
Bermuda Grass IgE: <0.1 kU/L
Cat Dander IgE: 0.36 kU/L — AB
Cedar, Mountain IgE: <0.1 kU/L
Cladosporium Herbarum IgE: <0.1 kU/L
Cockroach, German IgE: <0.1 kU/L
Common Silver Birch IgE: 3.41 kU/L — AB
Cottonwood IgE: 0.57 kU/L — AB
D Farinae IgE: <0.1 kU/L
D Pteronyssinus IgE: <0.1 kU/L
Dog Dander IgE: 0.52 kU/L — AB
Elm, American IgE: <0.1 kU/L
IgE (Immunoglobulin E), Serum: 467 IU/mL (ref 6–495)
Johnson Grass IgE: <0.1 kU/L
Maple/Box Elder IgE: 0.19 kU/L — AB
Mouse Urine IgE: <0.1 kU/L
Oak, White IgE: 0.24 kU/L — AB
Pecan, Hickory IgE: 0.63 kU/L — AB
Penicillium Chrysogen IgE: <0.1 kU/L
Pigweed, Rough IgE: <0.1 kU/L
Ragweed, Short IgE: <0.1 kU/L
Sheep Sorrel IgE Qn: <0.1 kU/L
Timothy Grass IgE: <0.1 kU/L
White Mulberry IgE: <0.1 kU/L

## 2018-12-22 LAB — IGG, IGA, IGM
IgA/Immunoglobulin A, Serum: 1132 mg/dL — ABNORMAL HIGH (ref 90–386)
IgG (Immunoglobin G), Serum: 1640 mg/dL — ABNORMAL HIGH (ref 700–1600)
IgM (Immunoglobulin M), Srm: 132 mg/dL (ref 20–172)

## 2018-12-22 LAB — DIPHTHERIA / TETANUS ANTIBODY PANEL
Diphtheria Ab: 2.11 IU/mL (ref <0.10)
Tetanus Ab, IgG: >7 IU/mL (ref <0.10)

## 2018-12-22 NOTE — Telephone Encounter (Signed)
New hem appt has been scheduled for the pt to see Dr. Julien Nordmann on 1/18 at 10am. Pt aware to arrive 30 minutes early. Letter mailed.

## 2018-12-22 NOTE — Telephone Encounter (Signed)
Referral is in the correct place.   Noted.  Thanks

## 2018-12-25 ENCOUNTER — Other Ambulatory Visit: Payer: Self-pay | Admitting: Orthopedic Surgery

## 2018-12-25 DIAGNOSIS — Z96652 Presence of left artificial knee joint: Secondary | ICD-10-CM

## 2018-12-25 DIAGNOSIS — Z4889 Encounter for other specified surgical aftercare: Secondary | ICD-10-CM

## 2018-12-25 DIAGNOSIS — M25561 Pain in right knee: Secondary | ICD-10-CM

## 2018-12-25 DIAGNOSIS — R768 Other specified abnormal immunological findings in serum: Secondary | ICD-10-CM | POA: Diagnosis not present

## 2018-12-25 DIAGNOSIS — G8929 Other chronic pain: Secondary | ICD-10-CM

## 2018-12-26 ENCOUNTER — Other Ambulatory Visit: Payer: Self-pay | Admitting: Internal Medicine

## 2018-12-26 ENCOUNTER — Inpatient Hospital Stay (HOSPITAL_COMMUNITY)
Admission: RE | Admit: 2018-12-26 | Payer: Federal, State, Local not specified - PPO | Source: Ambulatory Visit | Admitting: Orthopedic Surgery

## 2018-12-26 ENCOUNTER — Encounter (HOSPITAL_COMMUNITY): Admission: RE | Payer: Self-pay | Source: Ambulatory Visit

## 2018-12-26 DIAGNOSIS — D472 Monoclonal gammopathy: Secondary | ICD-10-CM

## 2018-12-26 SURGERY — ARTHROPLASTY, KNEE, TOTAL
Anesthesia: Choice | Laterality: Right

## 2018-12-28 ENCOUNTER — Telehealth: Payer: Self-pay | Admitting: Internal Medicine

## 2018-12-28 LAB — IFE, PE AND FLC, SERUM
Albumin SerPl Elph-Mcnc: 3.5 g/dL (ref 2.9–4.4)
Albumin/Glob SerPl: 0.9 (ref 0.7–1.7)
Alpha 1: 0.3 g/dL (ref 0.0–0.4)
Alpha2 Glob SerPl Elph-Mcnc: 0.7 g/dL (ref 0.4–1.0)
B-Globulin SerPl Elph-Mcnc: 1.7 g/dL — ABNORMAL HIGH (ref 0.7–1.3)
Gamma Glob SerPl Elph-Mcnc: 1.4 g/dL (ref 0.4–1.8)
Globulin, Total: 4.1 g/dL — ABNORMAL HIGH (ref 2.2–3.9)
Ig Kappa Free Light Chain: 67.7 mg/L — ABNORMAL HIGH (ref 3.3–19.4)
Ig Lambda Free Light Chain: 48.1 mg/L — ABNORMAL HIGH (ref 5.7–26.3)
IgA/Immunoglobulin A, Serum: 1062 mg/dL — ABNORMAL HIGH (ref 90–386)
IgG (Immunoglobin G), Serum: 1523 mg/dL (ref 700–1600)
IgM (Immunoglobulin M), Srm: 129 mg/dL (ref 20–172)
Kappa/Lambda FluidC Ratio: 1.41 (ref 0.26–1.65)
Total Protein: 7.6 g/dL (ref 6.0–8.5)

## 2018-12-28 LAB — PE+INTERP(RFX IFE), 24-HR U
Albumin, U: 100 %
Alpha 1, Urine: 0 %
Alpha 2, Urine: 0 %
Beta, Urine: 0 %
Gamma Globulin, Urine: 0 %
Protein, 24H Urine: 218 mg/24 hr — ABNORMAL HIGH (ref 30–150)
Protein, Ur: 16.9 mg/dL

## 2018-12-28 NOTE — Telephone Encounter (Signed)
Lab appt scheduled for 1/17 at 3pm. Pt has been made aware.

## 2018-12-29 ENCOUNTER — Inpatient Hospital Stay: Payer: Federal, State, Local not specified - PPO | Attending: Internal Medicine

## 2018-12-29 DIAGNOSIS — D472 Monoclonal gammopathy: Secondary | ICD-10-CM

## 2018-12-29 DIAGNOSIS — D696 Thrombocytopenia, unspecified: Secondary | ICD-10-CM | POA: Insufficient documentation

## 2018-12-29 DIAGNOSIS — E785 Hyperlipidemia, unspecified: Secondary | ICD-10-CM | POA: Diagnosis not present

## 2018-12-29 DIAGNOSIS — I129 Hypertensive chronic kidney disease with stage 1 through stage 4 chronic kidney disease, or unspecified chronic kidney disease: Secondary | ICD-10-CM | POA: Diagnosis not present

## 2018-12-29 DIAGNOSIS — Z794 Long term (current) use of insulin: Secondary | ICD-10-CM | POA: Insufficient documentation

## 2018-12-29 DIAGNOSIS — Z7982 Long term (current) use of aspirin: Secondary | ICD-10-CM | POA: Diagnosis not present

## 2018-12-29 DIAGNOSIS — M1990 Unspecified osteoarthritis, unspecified site: Secondary | ICD-10-CM | POA: Diagnosis not present

## 2018-12-29 DIAGNOSIS — D649 Anemia, unspecified: Secondary | ICD-10-CM | POA: Diagnosis not present

## 2018-12-29 DIAGNOSIS — Z79899 Other long term (current) drug therapy: Secondary | ICD-10-CM | POA: Insufficient documentation

## 2018-12-29 DIAGNOSIS — J45909 Unspecified asthma, uncomplicated: Secondary | ICD-10-CM | POA: Diagnosis not present

## 2018-12-29 DIAGNOSIS — Z800 Family history of malignant neoplasm of digestive organs: Secondary | ICD-10-CM | POA: Diagnosis not present

## 2018-12-29 DIAGNOSIS — I2510 Atherosclerotic heart disease of native coronary artery without angina pectoris: Secondary | ICD-10-CM | POA: Diagnosis not present

## 2018-12-29 DIAGNOSIS — E1122 Type 2 diabetes mellitus with diabetic chronic kidney disease: Secondary | ICD-10-CM | POA: Diagnosis not present

## 2018-12-29 DIAGNOSIS — N182 Chronic kidney disease, stage 2 (mild): Secondary | ICD-10-CM | POA: Diagnosis not present

## 2018-12-29 DIAGNOSIS — G4730 Sleep apnea, unspecified: Secondary | ICD-10-CM | POA: Insufficient documentation

## 2018-12-29 DIAGNOSIS — R5383 Other fatigue: Secondary | ICD-10-CM | POA: Insufficient documentation

## 2018-12-29 LAB — CBC WITH DIFFERENTIAL (CANCER CENTER ONLY)
Abs Immature Granulocytes: 0.02 10*3/uL (ref 0.00–0.07)
Basophils Absolute: 0 10*3/uL (ref 0.0–0.1)
Basophils Relative: 0 %
Eosinophils Absolute: 0.1 10*3/uL (ref 0.0–0.5)
Eosinophils Relative: 1 %
HCT: 29.9 % — ABNORMAL LOW (ref 39.0–52.0)
Hemoglobin: 9.3 g/dL — ABNORMAL LOW (ref 13.0–17.0)
Immature Granulocytes: 0 %
Lymphocytes Relative: 27 %
Lymphs Abs: 1.2 10*3/uL (ref 0.7–4.0)
MCH: 27.7 pg (ref 26.0–34.0)
MCHC: 31.1 g/dL (ref 30.0–36.0)
MCV: 89 fL (ref 80.0–100.0)
Monocytes Absolute: 0.4 10*3/uL (ref 0.1–1.0)
Monocytes Relative: 8 %
Neutro Abs: 2.8 10*3/uL (ref 1.7–7.7)
Neutrophils Relative %: 64 %
Platelet Count: 66 10*3/uL — ABNORMAL LOW (ref 150–400)
RBC: 3.36 MIL/uL — ABNORMAL LOW (ref 4.22–5.81)
RDW: 15.7 % — ABNORMAL HIGH (ref 11.5–15.5)
WBC Count: 4.5 10*3/uL (ref 4.0–10.5)
nRBC: 0 % (ref 0.0–0.2)

## 2018-12-29 LAB — CMP (CANCER CENTER ONLY)
ALT: 18 U/L (ref 0–44)
AST: 40 U/L (ref 15–41)
Albumin: 3.3 g/dL — ABNORMAL LOW (ref 3.5–5.0)
Alkaline Phosphatase: 68 U/L (ref 38–126)
Anion gap: 9 (ref 5–15)
BUN: 19 mg/dL (ref 6–20)
CO2: 24 mmol/L (ref 22–32)
Calcium: 8.7 mg/dL — ABNORMAL LOW (ref 8.9–10.3)
Chloride: 104 mmol/L (ref 98–111)
Creatinine: 1.31 mg/dL — ABNORMAL HIGH (ref 0.61–1.24)
GFR, Est AFR Am: >60 mL/min (ref >60)
GFR, Estimated: 59 mL/min — ABNORMAL LOW (ref >60)
Glucose, Bld: 92 mg/dL (ref 70–99)
Potassium: 3.9 mmol/L (ref 3.5–5.1)
Sodium: 137 mmol/L (ref 135–145)
Total Bilirubin: 0.6 mg/dL (ref 0.3–1.2)
Total Protein: 7.6 g/dL (ref 6.5–8.1)

## 2018-12-29 LAB — LACTATE DEHYDROGENASE: LDH: 151 U/L (ref 98–192)

## 2018-12-30 ENCOUNTER — Encounter: Payer: Self-pay | Admitting: Internal Medicine

## 2018-12-30 ENCOUNTER — Inpatient Hospital Stay: Payer: Federal, State, Local not specified - PPO

## 2018-12-30 ENCOUNTER — Other Ambulatory Visit: Payer: Self-pay

## 2018-12-30 ENCOUNTER — Inpatient Hospital Stay (HOSPITAL_BASED_OUTPATIENT_CLINIC_OR_DEPARTMENT_OTHER): Payer: Federal, State, Local not specified - PPO | Admitting: Internal Medicine

## 2018-12-30 VITALS — BP 146/70 | HR 84 | Temp 97.9°F | Resp 18 | Ht 70.5 in | Wt 277.1 lb

## 2018-12-30 DIAGNOSIS — M1990 Unspecified osteoarthritis, unspecified site: Secondary | ICD-10-CM

## 2018-12-30 DIAGNOSIS — D649 Anemia, unspecified: Secondary | ICD-10-CM

## 2018-12-30 DIAGNOSIS — J45909 Unspecified asthma, uncomplicated: Secondary | ICD-10-CM

## 2018-12-30 DIAGNOSIS — D539 Nutritional anemia, unspecified: Secondary | ICD-10-CM

## 2018-12-30 DIAGNOSIS — D696 Thrombocytopenia, unspecified: Secondary | ICD-10-CM | POA: Diagnosis not present

## 2018-12-30 DIAGNOSIS — E785 Hyperlipidemia, unspecified: Secondary | ICD-10-CM | POA: Diagnosis not present

## 2018-12-30 DIAGNOSIS — E1122 Type 2 diabetes mellitus with diabetic chronic kidney disease: Secondary | ICD-10-CM

## 2018-12-30 DIAGNOSIS — G4730 Sleep apnea, unspecified: Secondary | ICD-10-CM

## 2018-12-30 DIAGNOSIS — I2510 Atherosclerotic heart disease of native coronary artery without angina pectoris: Secondary | ICD-10-CM

## 2018-12-30 DIAGNOSIS — Z79899 Other long term (current) drug therapy: Secondary | ICD-10-CM | POA: Diagnosis not present

## 2018-12-30 DIAGNOSIS — Z794 Long term (current) use of insulin: Secondary | ICD-10-CM | POA: Diagnosis not present

## 2018-12-30 DIAGNOSIS — Z7982 Long term (current) use of aspirin: Secondary | ICD-10-CM | POA: Diagnosis not present

## 2018-12-30 DIAGNOSIS — I129 Hypertensive chronic kidney disease with stage 1 through stage 4 chronic kidney disease, or unspecified chronic kidney disease: Secondary | ICD-10-CM

## 2018-12-30 DIAGNOSIS — Z800 Family history of malignant neoplasm of digestive organs: Secondary | ICD-10-CM

## 2018-12-30 DIAGNOSIS — N182 Chronic kidney disease, stage 2 (mild): Secondary | ICD-10-CM

## 2018-12-30 DIAGNOSIS — D890 Polyclonal hypergammaglobulinemia: Secondary | ICD-10-CM

## 2018-12-30 LAB — IRON AND TIBC
Iron: 45 ug/dL (ref 45–182)
Saturation Ratios: 9 % — ABNORMAL LOW (ref 17.9–39.5)
TIBC: 480 ug/dL — ABNORMAL HIGH (ref 250–450)
UIBC: 435 ug/dL

## 2018-12-30 LAB — FOLATE: Folate: 35.2 ng/mL (ref >5.9)

## 2018-12-30 LAB — FERRITIN: Ferritin: 11 ng/mL — ABNORMAL LOW (ref 24–336)

## 2018-12-30 LAB — IGG, IGA, IGM
IgA: 1030 mg/dL — ABNORMAL HIGH (ref 90–386)
IgG (Immunoglobin G), Serum: 1448 mg/dL (ref 700–1600)
IgM (Immunoglobulin M), Srm: 139 mg/dL (ref 20–172)

## 2018-12-30 LAB — VITAMIN B12: Vitamin B-12: 391 pg/mL (ref 180–914)

## 2018-12-30 NOTE — Progress Notes (Signed)
[facility] [phone]   [phone]  CONSULT NOTE  REFERRING PHYSICIAN:  Prudy Feeler, MD  REASON FOR CONSULTATION:  58 years old white male with elevated IgA level.  HPI [name] is a 58 y.o. male with past medical history significant for osteoarthritis, coronary artery disease, chronic kidney disease, hypertension, dyslipidemia, diabetes mellitus, and obstructive sleep apnea.  The patient is currently followed by Dr. Nelva Bush for evaluation of allergic conditions.  He was also found mold recently in his house.  He has been suffering from frequent bronchitis in the last for 5 months.  During his evaluation he had several studies performed including quantitative immunoglobulin which showed slightly elevated IgG of 1640 and elevated IgA of 1132 but normal IgM.  The patient also had serum protein electrophoresis with immunofixation that showed no M spike and polyclonal gammopathy.  His CBC showed normal white blood count of 6.4 but low hemoglobin of 10.1 and hematocrit 30.2% and platelets count of 87,000.  I order repeat quantitative immunoglobulin which was performed yesterday and that showed normal IgG of 1448 but persistent elevation of IgA of 1030.  His CBC showed persistent anemia with further drop in his hemoglobin to 9.3 and hematocrit 29.9% with low platelets count of 66,000. When seen today the patient is feeling fine except for persistent cough and shortness of breath with exertion but no significant chest pain or hemoptysis.  He denied having any recent weight loss or night sweats.  He has no nausea, vomiting, diarrhea or constipation. Family history significant for mother with ALS and father had stomach cancer. The patient is married and has no children.  He was accompanied today by [name].  The patient is self-employed and he works on [employer].  He has no history for smoking, alcohol or drug abuse.  HPI  Past Medical  History:  Diagnosis Date  . Arthritis   . CAD (coronary artery disease)    Multivessel disease status post CABG 08/2015  . CKD (chronic kidney disease), stage II   . Essential hypertension   . Hyperlipidemia   . Hypertension   . OSA on CPAP   . Type 2 diabetes mellitus ([place])     Past Surgical History:  Procedure Laterality Date  . APPENDECTOMY    . Biceps tendon surgery Right   . CARDIAC CATHETERIZATION N/A 08/25/2015   Procedure: Left Heart Cath and Coronary Angiography;  Surgeon: Belva Crome, MD;  Location: [facility];  Service: Cardiovascular;  Laterality: N/A;  . CORONARY ARTERY BYPASS GRAFT N/A 08/29/2015   Procedure: CORONARY ARTERY BYPASS GRAFTING (CABG);  Surgeon: [name], MD;  Location: [facility];  Service: Open Heart Surgery;  Laterality: N/A;  . KNEE ARTHROSCOPY Left   . TEE WITHOUT CARDIOVERSION N/A 08/29/2015   Procedure: TRANSESOPHAGEAL ECHOCARDIOGRAM (TEE);  Surgeon: [name], MD;  Location: [facility];  Service: Open Heart Surgery;  Laterality: N/A;  . TOTAL KNEE ARTHROPLASTY Left 03/09/2017   Procedure: TOTAL KNEE ARTHROPLASTY;  Surgeon: Carole Civil, MD;  Location: [facility];  Service: Orthopedics;  Laterality: Left;    Family History  Problem Relation Age of Onset  . Arthritis Other   . Cancer Other   . Diabetes Other   . CAD Father   . Diabetes Mellitus II Father   . CAD Brother   . Diabetes Mellitus II Brother   . Anesthesia problems Neg Hx   . Hypotension Neg Hx   .  Malignant hyperthermia Neg Hx   . Pseudochol deficiency Neg Hx     Social History Social History   Tobacco Use  . Smoking status: Never Smoker  . Smokeless tobacco: Never Used  Substance Use Topics  . Alcohol use: No    Alcohol/week: 0.0 standard drinks  . Drug use: No    No Known Allergies  Current Outpatient Medications  Medication Sig Dispense Refill  . albuterol (PROVENTIL HFA;VENTOLIN HFA) 108 (90 Base) MCG/ACT inhaler Inhale 2 puffs into the  lungs every 6 (six) hours as needed for wheezing or shortness of breath. 1 Inhaler 2  . aspirin EC 81 MG tablet Take 81 mg by mouth at bedtime.     [name] (EUCRISA) 2 % OINT Apply 1 application topically 2 (two) times daily as needed. 60 g 5  . diphenhydrAMINE (BENADRYL) 50 MG tablet Take 1 tablet (50 mg total) by mouth at bedtime as needed for itching. 30 tablet 0  . Flaxseed, Linseed, (FLAXSEED OIL PO) Take 500 mg by mouth daily.    . hydrochlorothiazide (HYDRODIURIL) 25 MG tablet Take 12.5 mg by mouth daily.   3  . insulin aspart (NOVOLOG) 100 UNIT/ML injection Inject 0.4-35 Units into the skin 3 (three) times daily before meals.     . insulin lispro protamine-lispro (HUMALOG 75/25 MIX) (75-25) 100 UNIT/ML SUSP injection Inject 35-70 Units into the skin 3 (three) times daily.     [name] KOMBIGLYZE XR 2.04-999 MG TB24 Take 1 tablet by mouth 2 (two) times daily.     [name] loratadine (CLARITIN) 10 MG tablet Take 10 mg by mouth daily.    . methocarbamol (ROBAXIN) 500 MG tablet TAKE 1 TABLET(500 MG) BY MOUTH THREE TIMES DAILY 56 tablet 0  . metoprolol tartrate (LOPRESSOR) 25 MG tablet TAKE 1/2 TABLET(12.5 MG) BY MOUTH TWICE DAILY (Patient taking differently: Take 12.5 mg by mouth 2 (two) times daily. ) 90 tablet 3  . montelukast (SINGULAIR) 10 MG tablet Take 1 tablet (10 mg total) by mouth at bedtime. 30 tablet 5  . Multiple Vitamins-Minerals (MULTIVITAMIN ADULT PO) Take 1 tablet by mouth daily.    [name] omeprazole (PRILOSEC) 40 MG capsule Take 40 mg by mouth at bedtime.  2  . pimecrolimus (ELIDEL) 1 % cream Apply topically 2 (two) times daily. 30 g 5  . pravastatin (PRAVACHOL) 20 MG tablet TAKE 1 TABLET(20 MG) BY MOUTH AT BEDTIME (Patient taking differently: Take 20 mg by mouth daily. ) 90 tablet 3  . traMADol-acetaminophen (ULTRACET) 37.5-325 MG tablet TAKE 1 TABLET BY MOUTH EVERY 4 HOURS FOR UP TO 7 DAYS AS NEEDED FOR MODERATE PAIN 42 tablet 0  . TRESIBA FLEXTOUCH 200 UNIT/ML SOPN Inject 60-70 Units  into the skin See admin instructions. Inject 70 units subcutaneously in the morning and 60 units in the evening    . TURMERIC CURCUMIN PO Take 1,000 mg by mouth daily.     [name] ezetimibe (ZETIA) 10 MG tablet Take 1 tablet (10 mg total) by mouth daily. 90 tablet 3  . furosemide (LASIX) 20 MG tablet Take 1 tablet (20 mg total) by mouth daily. 90 tablet 3   No current facility-administered medications for this visit.     Review of Systems  Constitutional: positive for fatigue Eyes: negative Ears, nose, mouth, throat, and face: negative Respiratory: positive for cough and dyspnea on exertion Cardiovascular: negative Gastrointestinal: negative Genitourinary:negative Integument/breast: negative Hematologic/lymphatic: negative Musculoskeletal:negative Neurological: negative Behavioral/Psych: negative Endocrine: negative Allergic/Immunologic: negative  Physical Exam  EKC:MKLKJ, healthy, no  distress, well nourished, well developed and anxious SKIN: skin color, texture, turgor are normal, no rashes or significant lesions HEAD: Normocephalic, No masses, lesions, tenderness or abnormalities EYES: normal, PERRLA, Conjunctiva are pink and non-injected EARS: External ears normal, Canals clear OROPHARYNX:no exudate, no erythema and lips, buccal mucosa, and tongue normal  NECK: supple, no adenopathy, no JVD LYMPH:  no palpable lymphadenopathy, no hepatosplenomegaly LUNGS: clear to auscultation , and palpation HEART: regular rate & rhythm, no murmurs and no gallops ABDOMEN:abdomen soft, non-tender, normal bowel sounds and no masses or organomegaly BACK: No CVA tenderness, Range of motion is normal EXTREMITIES:no joint deformities, effusion, or inflammation, no edema  NEURO: alert & oriented x 3 with fluent speech, no focal motor/sensory deficits  PERFORMANCE STATUS: ECOG 1  LABORATORY DATA: Lab Results  Component Value Date   WBC 4.5 12/29/2018   HGB 9.3 (L) 12/29/2018   HCT 29.9 (L)  12/29/2018   MCV 89.0 12/29/2018   PLT 66 (L) 12/29/2018      Chemistry      Component Value Date/Time   NA 137 12/29/2018 1519   NA 140 12/15/2018 1525   K 3.9 12/29/2018 1519   CL 104 12/29/2018 1519   CO2 24 12/29/2018 1519   BUN 19 12/29/2018 1519   BUN 20 12/15/2018 1525   CREATININE 1.31 (H) 12/29/2018 1519      Component Value Date/Time   CALCIUM 8.7 (L) 12/29/2018 1519   ALKPHOS 68 12/29/2018 1519   AST 40 12/29/2018 1519   ALT 18 12/29/2018 1519   BILITOT 0.6 12/29/2018 1519       RADIOGRAPHIC STUDIES: No results found.  ASSESSMENT: This is a very pleasant 58 years old white male presented for evaluation of elevated IgA level.  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  He is followed by allergy and asthma medicine for this problem. The patient was also found to have significant bicytopenia with concerning anemia and thrombocytopenia.   PLAN: I had a lengthy discussion with the patient and his wife today about his condition and further investigation to confirm the etiology of his bicytopenia.  I order iron study and ferritin, vitamin B12, serum folate as well as serum erythropoietin in addition to acute hepatitis panel and HIV. I will also arrange for the patient to have a bone marrow biopsy and aspirate to rule out any underlying bone marrow abnormality. I will see the patient back for follow-up visit in 2-3 weeks for reevaluation and discussion of the pending lab results and further recommendation regarding his condition. The patient was advised to call immediately if he has any concerning symptoms in the interval.  The patient voices understanding of current disease status and treatment options and is in agreement with the current care plan.  All questions were answered. The patient knows to call the clinic with any problems, questions or concerns. We can certainly see the patient much sooner if necessary.  Thank you so much  for allowing me to participate in the care of [name]. I will continue to follow up the patient with you and assist in his care.  I spent 40 minutes counseling the patient face to face. The total time spent in the appointment was 60 minutes.  Disclaimer: This note was dictated with voice recognition software. Similar sounding words can inadvertently be transcribed and may not be corrected upon review.   Eilleen Kempf December 30, 2018, 10:01 AM

## 2018-12-31 LAB — BETA 2 MICROGLOBULIN, SERUM: Beta-2 Microglobulin: 2 mg/L (ref 0.6–2.4)

## 2018-12-31 LAB — ERYTHROPOIETIN: Erythropoietin: 119.1 m[IU]/mL — ABNORMAL HIGH (ref 2.6–18.5)

## 2018-12-31 LAB — HIV ANTIBODY (ROUTINE TESTING W REFLEX): HIV Screen 4th Generation wRfx: NONREACTIVE

## 2019-01-01 LAB — KAPPA/LAMBDA LIGHT CHAINS
Kappa free light chain: 75.1 mg/L — ABNORMAL HIGH (ref 3.3–19.4)
Kappa, lambda light chain ratio: 1.59 (ref 0.26–1.65)
Lambda free light chains: 47.1 mg/L — ABNORMAL HIGH (ref 5.7–26.3)

## 2019-01-02 LAB — HEPATITIS PANEL, ACUTE
HCV Ab: <0.1 s/co ratio (ref 0.0–0.9)
Hep A IgM: NEGATIVE
Hep B C IgM: NEGATIVE
Hepatitis B Surface Ag: NEGATIVE

## 2019-01-03 DIAGNOSIS — E1165 Type 2 diabetes mellitus with hyperglycemia: Secondary | ICD-10-CM | POA: Diagnosis not present

## 2019-01-03 DIAGNOSIS — E7800 Pure hypercholesterolemia, unspecified: Secondary | ICD-10-CM | POA: Diagnosis not present

## 2019-01-03 DIAGNOSIS — I10 Essential (primary) hypertension: Secondary | ICD-10-CM | POA: Diagnosis not present

## 2019-01-03 DIAGNOSIS — N189 Chronic kidney disease, unspecified: Secondary | ICD-10-CM | POA: Diagnosis not present

## 2019-01-09 ENCOUNTER — Other Ambulatory Visit: Payer: Self-pay | Admitting: Orthopedic Surgery

## 2019-01-09 ENCOUNTER — Other Ambulatory Visit: Payer: Self-pay | Admitting: *Deleted

## 2019-01-09 DIAGNOSIS — Z4889 Encounter for other specified surgical aftercare: Secondary | ICD-10-CM

## 2019-01-09 DIAGNOSIS — M25561 Pain in right knee: Principal | ICD-10-CM

## 2019-01-09 DIAGNOSIS — Z96652 Presence of left artificial knee joint: Secondary | ICD-10-CM

## 2019-01-09 DIAGNOSIS — D539 Nutritional anemia, unspecified: Secondary | ICD-10-CM

## 2019-01-09 DIAGNOSIS — G8929 Other chronic pain: Secondary | ICD-10-CM

## 2019-01-10 ENCOUNTER — Inpatient Hospital Stay (HOSPITAL_BASED_OUTPATIENT_CLINIC_OR_DEPARTMENT_OTHER): Payer: Federal, State, Local not specified - PPO | Admitting: Adult Health

## 2019-01-10 ENCOUNTER — Inpatient Hospital Stay: Payer: Federal, State, Local not specified - PPO

## 2019-01-10 VITALS — BP 113/63 | HR 93 | Temp 100.1°F | Resp 18

## 2019-01-10 DIAGNOSIS — D696 Thrombocytopenia, unspecified: Secondary | ICD-10-CM

## 2019-01-10 DIAGNOSIS — M1990 Unspecified osteoarthritis, unspecified site: Secondary | ICD-10-CM | POA: Diagnosis not present

## 2019-01-10 DIAGNOSIS — N182 Chronic kidney disease, stage 2 (mild): Secondary | ICD-10-CM | POA: Diagnosis not present

## 2019-01-10 DIAGNOSIS — J45909 Unspecified asthma, uncomplicated: Secondary | ICD-10-CM | POA: Diagnosis not present

## 2019-01-10 DIAGNOSIS — E785 Hyperlipidemia, unspecified: Secondary | ICD-10-CM | POA: Diagnosis not present

## 2019-01-10 DIAGNOSIS — I129 Hypertensive chronic kidney disease with stage 1 through stage 4 chronic kidney disease, or unspecified chronic kidney disease: Secondary | ICD-10-CM | POA: Diagnosis not present

## 2019-01-10 DIAGNOSIS — Z794 Long term (current) use of insulin: Secondary | ICD-10-CM | POA: Diagnosis not present

## 2019-01-10 DIAGNOSIS — E1122 Type 2 diabetes mellitus with diabetic chronic kidney disease: Secondary | ICD-10-CM | POA: Diagnosis not present

## 2019-01-10 DIAGNOSIS — D61818 Other pancytopenia: Secondary | ICD-10-CM | POA: Diagnosis not present

## 2019-01-10 DIAGNOSIS — I2510 Atherosclerotic heart disease of native coronary artery without angina pectoris: Secondary | ICD-10-CM | POA: Diagnosis not present

## 2019-01-10 DIAGNOSIS — Z79899 Other long term (current) drug therapy: Secondary | ICD-10-CM | POA: Diagnosis not present

## 2019-01-10 DIAGNOSIS — D649 Anemia, unspecified: Secondary | ICD-10-CM | POA: Diagnosis not present

## 2019-01-10 DIAGNOSIS — D539 Nutritional anemia, unspecified: Secondary | ICD-10-CM

## 2019-01-10 DIAGNOSIS — G4730 Sleep apnea, unspecified: Secondary | ICD-10-CM | POA: Diagnosis not present

## 2019-01-10 DIAGNOSIS — Z7982 Long term (current) use of aspirin: Secondary | ICD-10-CM | POA: Diagnosis not present

## 2019-01-10 DIAGNOSIS — Z800 Family history of malignant neoplasm of digestive organs: Secondary | ICD-10-CM | POA: Diagnosis not present

## 2019-01-10 LAB — CBC WITH DIFFERENTIAL (CANCER CENTER ONLY)
Abs Immature Granulocytes: 0.01 10*3/uL (ref 0.00–0.07)
Basophils Absolute: 0 10*3/uL (ref 0.0–0.1)
Basophils Relative: 0 %
Eosinophils Absolute: 0 10*3/uL (ref 0.0–0.5)
Eosinophils Relative: 1 %
HCT: 31.4 % — ABNORMAL LOW (ref 39.0–52.0)
Hemoglobin: 9.7 g/dL — ABNORMAL LOW (ref 13.0–17.0)
Immature Granulocytes: 0 %
Lymphocytes Relative: 25 %
Lymphs Abs: 0.7 10*3/uL (ref 0.7–4.0)
MCH: 28.4 pg (ref 26.0–34.0)
MCHC: 30.9 g/dL (ref 30.0–36.0)
MCV: 91.8 fL (ref 80.0–100.0)
Monocytes Absolute: 0.6 10*3/uL (ref 0.1–1.0)
Monocytes Relative: 22 %
Neutro Abs: 1.4 10*3/uL — ABNORMAL LOW (ref 1.7–7.7)
Neutrophils Relative %: 52 %
Platelet Count: 63 10*3/uL — ABNORMAL LOW (ref 150–400)
RBC: 3.42 MIL/uL — ABNORMAL LOW (ref 4.22–5.81)
RDW: 17.3 % — ABNORMAL HIGH (ref 11.5–15.5)
WBC Count: 2.8 10*3/uL — ABNORMAL LOW (ref 4.0–10.5)
nRBC: 0 % (ref 0.0–0.2)

## 2019-01-10 MED ORDER — ACETAMINOPHEN 325 MG PO TABS
ORAL_TABLET | ORAL | Status: AC
  Filled 2019-01-10: qty 2

## 2019-01-10 MED ORDER — ACETAMINOPHEN 325 MG PO TABS
650.0000 mg | ORAL_TABLET | Freq: Once | ORAL | Status: AC
  Administered 2019-01-10: 650 mg via ORAL

## 2019-01-10 NOTE — Progress Notes (Signed)
INDICATION: refractory anemia, thrombocytopenia   Bone Marrow Biopsy and Aspiration Procedure Note   Informed consent was obtained and potential risks including bleeding, infection and pain were reviewed with the patient.  The patient's name, date of birth, identification, consent and allergies were verified prior to the start of procedure and time out was performed.  The right posterior iliac crest was chosen as the site of biopsy.  The skin was prepped with ChloraPrep.   8 cc of 2% lidocaine was used to provide local anaesthesia.   Unable to obtain aspirate obtained 2cm biopsy.  Pressure was applied to the biopsy site and bandage was placed over the biopsy site. Patient was made to lie on the back for 30 mins prior to discharge.  The procedure was tolerated well. COMPLICATIONS: None BLOOD LOSS: none The patient was discharged home in stable condition with a 1 week follow up to review results.  Patient was provided with post bone marrow biopsy instructions and instructed to call if there was any bleeding or worsening pain.  Specimens sent for flow cytometry, cytogenetics and additional studies.  Signed Scot Dock, NP

## 2019-01-10 NOTE — Progress Notes (Signed)
At 8:50am when VS were obtained, temp 100.1 patient and spouse stated that they have not been feeling well and think :they are trying to catch something" Contacted Mendel Ryder RN and obtained order for tylenol po. Instructed patient and spouse to monitor temp through out the day and contact PCP if needed. Also educated on post biopsy instructions and provided written education. Site at 9 am clean, dry, intact and no drainage. Patient is are to call with any drainage, redness, swelling, or pain to the site. Patient dc'd and instructed t go to lab for post biopsy CBC. Called lab and made aware that patient on his way.

## 2019-01-10 NOTE — Patient Instructions (Signed)
[facility] Discharge Instructions for Post Bone Marrow Procedure  Today you had a bone marrow biopsy and aspirate of right hip.  Please keep the pressure dressing in place for at least 24 hours.  Have someone check your dressing periodically for bleeding.  If needed you can reapply a pressure dressing to the site.  Take pain medication as directed.  IF BLEEDING REOCCURS THAT SHOULD BE REPORTED IMMEDIATELY. Call the Cancer Center at [phone] if during business hours. Or report to the Emergency Room.   I have been informed and understand all the instructions given to me. I know to contact the clinic, my physician, or go to the Emergency Department if any problems should occur. I do not have any questions at this time, but understand that I may call the clinic during office hours at [phone]  should I have any questions or need assistance in obtaining follow up care.    __________________________________________  _____________  __________ Signature of Patient or Authorized Representative            Date                   Time    __________________________________________ Nurse's Signature     

## 2019-01-11 DIAGNOSIS — Z6841 Body Mass Index (BMI) 40.0 and over, adult: Secondary | ICD-10-CM | POA: Diagnosis not present

## 2019-01-11 DIAGNOSIS — J069 Acute upper respiratory infection, unspecified: Secondary | ICD-10-CM | POA: Diagnosis not present

## 2019-01-11 DIAGNOSIS — J683 Other acute and subacute respiratory conditions due to chemicals, gases, fumes and vapors: Secondary | ICD-10-CM | POA: Diagnosis not present

## 2019-01-11 DIAGNOSIS — Z1389 Encounter for screening for other disorder: Secondary | ICD-10-CM | POA: Diagnosis not present

## 2019-01-14 ENCOUNTER — Other Ambulatory Visit: Payer: Self-pay | Admitting: Cardiology

## 2019-01-15 ENCOUNTER — Other Ambulatory Visit (HOSPITAL_COMMUNITY): Payer: Self-pay | Admitting: Family Medicine

## 2019-01-15 ENCOUNTER — Ambulatory Visit (HOSPITAL_COMMUNITY)
Admission: RE | Admit: 2019-01-15 | Discharge: 2019-01-15 | Disposition: A | Discharge status: Home / Self Care | Payer: Federal, State, Local not specified - PPO | Source: Ambulatory Visit | Attending: Family Medicine | Admitting: Family Medicine

## 2019-01-15 DIAGNOSIS — R05 Cough: Secondary | ICD-10-CM | POA: Diagnosis not present

## 2019-01-15 DIAGNOSIS — R0602 Shortness of breath: Secondary | ICD-10-CM | POA: Diagnosis not present

## 2019-01-15 DIAGNOSIS — R509 Fever, unspecified: Secondary | ICD-10-CM

## 2019-01-15 DIAGNOSIS — R059 Cough, unspecified: Secondary | ICD-10-CM

## 2019-01-18 ENCOUNTER — Encounter: Payer: Self-pay | Admitting: Oncology

## 2019-01-18 ENCOUNTER — Inpatient Hospital Stay: Payer: Federal, State, Local not specified - PPO | Attending: Internal Medicine | Admitting: Oncology

## 2019-01-18 VITALS — BP 135/79 | HR 85 | Temp 97.6°F | Resp 18 | Ht 70.5 in | Wt 270.5 lb

## 2019-01-18 DIAGNOSIS — I2510 Atherosclerotic heart disease of native coronary artery without angina pectoris: Secondary | ICD-10-CM | POA: Diagnosis not present

## 2019-01-18 DIAGNOSIS — J45909 Unspecified asthma, uncomplicated: Secondary | ICD-10-CM | POA: Diagnosis not present

## 2019-01-18 DIAGNOSIS — N183 Chronic kidney disease, stage 3 (moderate): Secondary | ICD-10-CM | POA: Diagnosis not present

## 2019-01-18 DIAGNOSIS — E785 Hyperlipidemia, unspecified: Secondary | ICD-10-CM | POA: Insufficient documentation

## 2019-01-18 DIAGNOSIS — D61818 Other pancytopenia: Secondary | ICD-10-CM | POA: Insufficient documentation

## 2019-01-18 DIAGNOSIS — Z794 Long term (current) use of insulin: Secondary | ICD-10-CM | POA: Insufficient documentation

## 2019-01-18 DIAGNOSIS — M129 Arthropathy, unspecified: Secondary | ICD-10-CM | POA: Diagnosis not present

## 2019-01-18 DIAGNOSIS — Z7982 Long term (current) use of aspirin: Secondary | ICD-10-CM | POA: Diagnosis not present

## 2019-01-18 DIAGNOSIS — D890 Polyclonal hypergammaglobulinemia: Secondary | ICD-10-CM

## 2019-01-18 DIAGNOSIS — I129 Hypertensive chronic kidney disease with stage 1 through stage 4 chronic kidney disease, or unspecified chronic kidney disease: Secondary | ICD-10-CM | POA: Insufficient documentation

## 2019-01-18 DIAGNOSIS — D649 Anemia, unspecified: Secondary | ICD-10-CM

## 2019-01-18 DIAGNOSIS — Z79899 Other long term (current) drug therapy: Secondary | ICD-10-CM | POA: Insufficient documentation

## 2019-01-18 DIAGNOSIS — D539 Nutritional anemia, unspecified: Secondary | ICD-10-CM | POA: Insufficient documentation

## 2019-01-18 DIAGNOSIS — E119 Type 2 diabetes mellitus without complications: Secondary | ICD-10-CM

## 2019-01-18 NOTE — Progress Notes (Signed)
[facility] OFFICE PROGRESS NOTE  [name], MD [place] [address]  DIAGNOSIS:  1) Pancytopenia  2) Elevated IgA level.  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  He is followed by allergy and asthma medicine for this problem.  PRIOR THERAPY: None  CURRENT THERAPY: None  INTERVAL HISTORY: [name] 58 y.o. male returns for a follow-up visit accompanied by his wife.  Patient is feeling well today with no concerning complaints except for fatigue.  He denies cough, shortness of breath, chest pain, palpitations, or wheezing. The patient denies any fever, chills, night sweats, or weight loss. He denies any diarrhea, constipation, nausea, or vomiting. He denies hematuria, melena, hematochezia, hemoptysis, epistaxis, or easy bleeding or bruising. His last colonoscopy was 9 years ago which he states was "clear". He is due for a repeat colonoscopy in approximately 1 year. The patient had recently undergone a bone marrow biopsy. The patient is here today review the recent lab results and for recommendations.   MEDICAL HISTORY: Past Medical History:  Diagnosis Date  . Arthritis   . CAD (coronary artery disease)    Multivessel disease status post CABG 08/2015  . CKD (chronic kidney disease), stage II   . Essential hypertension   . Hyperlipidemia   . Hypertension   . OSA on CPAP   . Type 2 diabetes mellitus (HCC)     ALLERGIES:  has No Known Allergies.  MEDICATIONS:  Current Outpatient Medications  Medication Sig Dispense Refill  . albuterol (PROVENTIL HFA;VENTOLIN HFA) 108 (90 Base) MCG/ACT inhaler Inhale 2 puffs into the lungs every 6 (six) hours as needed for wheezing or shortness of breath. 1 Inhaler 2  . aspirin EC 81 MG tablet Take 81 mg by mouth at bedtime.     [name] (EUCRISA) 2 % OINT Apply 1 application topically 2 (two) times daily as needed. 60 g 5  . diphenhydrAMINE (BENADRYL) 50 MG  tablet Take 1 tablet (50 mg total) by mouth at bedtime as needed for itching. 30 tablet 0  . Flaxseed, Linseed, (FLAXSEED OIL PO) Take 500 mg by mouth daily.    . hydrochlorothiazide (HYDRODIURIL) 25 MG tablet Take 12.5 mg by mouth daily.   3  . insulin aspart (NOVOLOG) 100 UNIT/ML injection Inject 0.4-35 Units into the skin 3 (three) times daily before meals.     [name] KOMBIGLYZE XR 2.04-999 MG TB24 Take 1 tablet by mouth 2 (two) times daily.     [name] loratadine (CLARITIN) 10 MG tablet Take 10 mg by mouth daily.    . methocarbamol (ROBAXIN) 500 MG tablet TAKE 1 TABLET(500 MG) BY MOUTH THREE TIMES DAILY 56 tablet 0  . metoprolol tartrate (LOPRESSOR) 25 MG tablet TAKE 1/2 TABLET(12.5 MG) BY MOUTH TWICE DAILY (Patient taking differently: Take 12.5 mg by mouth 2 (two) times daily. ) 90 tablet 3  . montelukast (SINGULAIR) 10 MG tablet Take 1 tablet (10 mg total) by mouth at bedtime. 30 tablet 5  . Multiple Vitamins-Minerals (MULTIVITAMIN ADULT PO) Take 1 tablet by mouth daily.    [name] omeprazole (PRILOSEC) 40 MG capsule Take 40 mg by mouth at bedtime.  2  . pimecrolimus (ELIDEL) 1 % cream Apply topically 2 (two) times daily. 30 g 5  . pravastatin (PRAVACHOL) 20 MG tablet TAKE 1 TABLET(20 MG) BY MOUTH AT BEDTIME 90 tablet 3  . traMADol-acetaminophen (ULTRACET) 37.5-325 MG tablet TAKE 1 TABLET BY MOUTH EVERY 4 HOURS FOR UP TO 7 DAYS  AS NEEDED FOR MODERATE PAIN 42 tablet 5  . TRESIBA FLEXTOUCH 200 UNIT/ML SOPN Inject 60-70 Units into the skin See admin instructions. Inject 70 units subcutaneously in the morning and 60 units in the evening    . TURMERIC CURCUMIN PO Take 1,000 mg by mouth daily.     [name] ezetimibe (ZETIA) 10 MG tablet Take 1 tablet (10 mg total) by mouth daily. 90 tablet 3  . furosemide (LASIX) 20 MG tablet Take 1 tablet (20 mg total) by mouth daily. 90 tablet 3  . insulin lispro protamine-lispro (HUMALOG 75/25 MIX) (75-25) 100 UNIT/ML SUSP injection Inject 35-70 Units into the skin 3 (three) times  daily.      No current facility-administered medications for this visit.     SURGICAL HISTORY:  Past Surgical History:  Procedure Laterality Date  . APPENDECTOMY    . Biceps tendon surgery Right   . CARDIAC CATHETERIZATION N/A 08/25/2015   Procedure: Left Heart Cath and Coronary Angiography;  Surgeon: Belva Crome, MD;  Location: [facility];  Service: Cardiovascular;  Laterality: N/A;  . CORONARY ARTERY BYPASS GRAFT N/A 08/29/2015   Procedure: CORONARY ARTERY BYPASS GRAFTING (CABG);  Surgeon: [name], MD;  Location: [facility];  Service: Open Heart Surgery;  Laterality: N/A;  . KNEE ARTHROSCOPY Left   . TEE WITHOUT CARDIOVERSION N/A 08/29/2015   Procedure: TRANSESOPHAGEAL ECHOCARDIOGRAM (TEE);  Surgeon: [name], MD;  Location: [facility];  Service: Open Heart Surgery;  Laterality: N/A;  . TOTAL KNEE ARTHROPLASTY Left 03/09/2017   Procedure: TOTAL KNEE ARTHROPLASTY;  Surgeon: Carole Civil, MD;  Location: [facility];  Service: Orthopedics;  Laterality: Left;    REVIEW OF SYSTEMS:   Review of Systems  Constitutional: Positive for fatigue. Negative for appetite change, chills, fever and unexpected weight change.  HENT:   Negative for mouth sores, nosebleeds, sore throat and trouble swallowing.   Eyes: Negative for eye problems and icterus.  Respiratory: Negative for cough, hemoptysis, shortness of breath and wheezing.   Cardiovascular: Negative for chest pain or palpitations. Gastrointestinal: Negative for abdominal pain, constipation, diarrhea, melena, hematochezia, nausea and vomiting.  Genitourinary: Negative for bladder incontinence, difficulty urinating, dysuria, frequency and hematuria.   Musculoskeletal: Negative for back pain, gait problem, neck pain and neck stiffness.  Skin: Negative for itching and rash.  Neurological: Negative for dizziness, extremity weakness, gait problem, headaches, light-headedness and seizures.  Hematological: Negative for  adenopathy. Does not bruise/bleed easily.  Psychiatric/Behavioral: Negative for confusion, depression and sleep disturbance. The patient is not nervous/anxious.     PHYSICAL EXAMINATION:  Blood pressure 135/79, pulse 85, temperature 97.6 F (36.4 C), temperature source Oral, resp. rate 18, height 5' 10.5" (1.791 m), weight 270 lb 8 oz (122.7 kg), SpO2 99 %.  ECOG PERFORMANCE STATUS: 1 - Symptomatic but completely ambulatory  Physical Exam  Constitutional: Oriented to person, place, and time and well-developed, well-nourished, and in no distress. No distress.  HENT:  Head: Normocephalic and atraumatic.  Mouth/Throat: Oropharynx is clear and moist. No oropharyngeal exudate.  Eyes: Conjunctivae are normal. Right eye exhibits no discharge. Left eye exhibits no discharge. No scleral icterus.  Neck: Normal range of motion. Neck supple.  Cardiovascular: Normal rate, regular rhythm, normal heart sounds and intact distal pulses.   Pulmonary/Chest: Effort normal and breath sounds normal. No respiratory distress. No wheezes. No rales.  Abdominal: Soft. Bowel sounds are normal. Exhibits no distension and no mass. There is no tenderness.  Musculoskeletal: Positive for lower extremity  edema. Normal range of motion. [name]  Lymphadenopathy:    No cervical adenopathy.  Neurological: Alert and oriented to person, place, and time. Exhibits normal muscle tone. Gait normal. Coordination normal.  Skin: Skin is warm and dry. No rash noted. Not diaphoretic. No erythema. No pallor.  Psychiatric: Mood, memory and judgment normal.  Vitals reviewed.  LABORATORY DATA: Lab Results  Component Value Date   WBC 2.8 (L) 01/10/2019   HGB 9.7 (L) 01/10/2019   HCT 31.4 (L) 01/10/2019   MCV 91.8 01/10/2019   PLT 63 (L) 01/10/2019      Chemistry      Component Value Date/Time   NA 137 12/29/2018 1519   NA 140 12/15/2018 1525   K 3.9 12/29/2018 1519   CL 104 12/29/2018 1519   CO2 24 12/29/2018 1519   BUN 19  12/29/2018 1519   BUN 20 12/15/2018 1525   CREATININE 1.31 (H) 12/29/2018 1519      Component Value Date/Time   CALCIUM 8.7 (L) 12/29/2018 1519   ALKPHOS 68 12/29/2018 1519   AST 40 12/29/2018 1519   ALT 18 12/29/2018 1519   BILITOT 0.6 12/29/2018 1519     Labs from 12/29/2018: Kappa free light chain 75.1, lambda free light chain 47.1, kappa lambda light chain ratio 1.59, IgG 1448, IgA, 1030, IgM 139, beta-2 microglobulin 2.0, LDH 151 Labs from 12/30/2018: Vitamin B12 391, HIV nonreactive, acute hepatitis panel negative, erythropoietin 119.1, ferritin 11, iron 45, TIBC 480, percent saturation 9, UIBC 435, folate 35.2  RADIOGRAPHIC STUDIES:  Dg Chest 2 View  Result Date: 01/16/2019 CLINICAL DATA:  Fever, cough.  Shortness of breath. EXAM: CHEST - 2 VIEW COMPARISON:  Radiographs of February 28, 2018. FINDINGS: The heart size and mediastinal contours are within normal limits. Both lungs are clear. No pneumothorax or pleural effusion is noted. Status post coronary bypass graft. The visualized skeletal structures are unremarkable. IMPRESSION: No active cardiopulmonary disease. Electronically Signed   By: Marijo Conception, M.D.   On: 01/16/2019 08:41   PATHOLOGY:  Diagnosis Bone Marrow Biopsy - LIMITED BONE MARROW MATERIAL. - SEE NOTE. PERIPHERAL BLOOD: - PANCYTOPENIA. Diagnosis Note The bone marrow material is extremely limited and considered suboptimal for evaluation. A repeat biopsy is recommended. (BNS:ah 01/11/19) Susanne Greenhouse MD Pathologist, Electronic Signature (Case signed 01/11/2019)  ASSESSMENT/PLAN: . This is a very pleasant 58 year old white male who presented for evaluation of elevated IgA levels.  This is likely polyclonal gammopathy.  Due to his allergic condition.  He is followed by allergy and asthma medicine for this problem.  Patient was also found to have concerning pancytopenia.   Patient was seen today with Dr. Julien Nordmann.  Lab results and biopsy results were reviewed with  the patient.  Bone marrow biopsy results demonstrated that it was inadequate sample and further investigation into the etiology of the pancytopenia is warranted to rule out any underlying bone marrow abnormality. I will arrange for the patient to see interventional radiology for a CT guided bone marrow biopsy in approximately 1 week.    The patient was found to have low ferritin at 11. The patient denied any bleeding. He had a colonoscopy at the age of 50 and is due for a repeat in approximately 1 year. The patient was given fecal occult cards to evaluate the anemia further. Discussed the possible need for a repeat colonoscopy earlier should the patient be postive for occult blood.  . I will see the patient back for follow-up visit in 2 weeks  for reevaluation and discussion of the pending results and further recommendation regarding his condition. The patient was advised to call immediately if he has any concerning symptoms in the interval.  The patient voices understanding of current disease status and treatment options and is in agreement with the current care plan.  All questions were answered. The patient knows to call the clinic with any problems, questions or concerns. We can certainly see the patient much sooner if necessary.   Orders Placed This Encounter  Procedures  . CT Biopsy    Standing Status:   Future    Standing Expiration Date:   01/18/2020    Order Specific Question:   Lab orders requested (DO NOT place separate lab orders, these will be automatically ordered during procedure specimen collection):    Answer:   Cytology - Non Pap    Comments:   Cytogenetics    Order Specific Question:   Lab orders requested (DO NOT place separate lab orders, these will be automatically ordered during procedure specimen collection):    Answer:   Other    Order Specific Question:   Reason for Exam (SYMPTOM  OR DIAGNOSIS REQUIRED)    Answer:   Pancytopenia    Order Specific Question:    Preferred imaging location?    Answer:   [facility]    Order Specific Question:   Radiology Contrast Protocol - do NOT remove file path    Answer:   \\[email or web address]  . CT BONE MARROW BIOPSY & ASPIRATION    Standing Status:   Future    Standing Expiration Date:   04/17/2020    Order Specific Question:   Reason for Exam (SYMPTOM  OR DIAGNOSIS REQUIRED)    Answer:   Pancytopenia    Order Specific Question:   Preferred imaging location?    Answer:   [facility]    Order Specific Question:   Radiology Contrast Protocol - do NOT remove file path    Answer:   \\[email or web address]  . Occult blood card to lab, stool    Standing Status:   Future    Standing Expiration Date:   01/19/2020  . Occult blood card to lab, stool    Standing Status:   Future    Standing Expiration Date:   01/19/2020  . Occult blood card to lab, stool    Standing Status:   Future    Standing Expiration Date:   01/19/2020  . CBC with Differential (Cancer Center Only)    Standing Status:   Future    Standing Expiration Date:   01/19/2020     Mikey Bussing, DNP, AGPCNP-BC, AOCNP 01/18/19   ADDENDUM: Hematology/Oncology Attending: I had a face-to-face encounter with the patient.  I recommended his care plan.  This is a very pleasant 58 years old white male with significant pancytopenia as well as elevated IgA.  His condition could be secondary to autoimmune disorder but the patient was also found recently on blood work to have significant iron deficiency. He had several blood work performed recently that were unremarkable except for the monoclonal gammopathy as well as iron deficiency.  He underwent a bone marrow biopsy and aspirate but unfortunately the specimen was not enough for analysis. I had a lengthy discussion with the patient today about his current condition and further investigation to confirm diagnosis. I recommended for the patient to have repeat  bone marrow biopsy and aspirate by interventional radiology for a better biopsy specimen. We will also give the  patient stool cards to check for Hemoccult.  I would also recommend for the patient to see his gastroenterologist for evaluation and to rule out any gastrointestinal bleed especially with the iron deficiency.  His last colonoscopy was many years ago and it is probably time for another one. I will see the patient back for follow-up visit in 2 weeks for evaluation and discussion of his biopsy results as well as recommendation regarding his condition. I may also start the patient empirically on oral iron tablets for now. The patient was advised to call immediately if he has any concerning symptoms in the interval.  Disclaimer: This note was dictated with voice recognition software. Similar sounding words can inadvertently be transcribed and may be missed upon review. Eilleen Kempf, MD  01/19/19

## 2019-01-19 ENCOUNTER — Ambulatory Visit: Payer: Medicare Other | Admitting: Internal Medicine

## 2019-01-19 ENCOUNTER — Encounter (HOSPITAL_COMMUNITY): Payer: Self-pay | Admitting: Internal Medicine

## 2019-01-19 ENCOUNTER — Telehealth: Payer: Self-pay | Admitting: Internal Medicine

## 2019-01-19 NOTE — Telephone Encounter (Signed)
Scheduled appt per 2/6 los - pt is aware of apt date and time

## 2019-01-22 ENCOUNTER — Telehealth: Payer: Self-pay | Admitting: Physician Assistant

## 2019-01-22 NOTE — Telephone Encounter (Signed)
Spoke to patient today. Advised patient to start taking oral iron supplements 1-2 tabs a day. Discussed patient take supplements with vitamin C rich sources. Discussed possible side effects including GI upset. Advised patient to start taking OTC stool softener should he develop constipation from the iron supplements. The patient expressed understanding and all questions were answered.

## 2019-01-23 ENCOUNTER — Telehealth: Payer: Self-pay | Admitting: Medical Oncology

## 2019-01-23 NOTE — Telephone Encounter (Signed)
Clarified type of biopsy . Pt understands it is for bone marrow aspiration and biopsy.

## 2019-01-25 ENCOUNTER — Other Ambulatory Visit: Payer: Self-pay | Admitting: Radiology

## 2019-01-26 ENCOUNTER — Ambulatory Visit (HOSPITAL_COMMUNITY)
Admission: RE | Admit: 2019-01-26 | Discharge: 2019-01-26 | Disposition: A | Discharge status: Home / Self Care | Payer: Federal, State, Local not specified - PPO | Source: Ambulatory Visit | Attending: Physician Assistant | Admitting: Physician Assistant

## 2019-01-26 ENCOUNTER — Encounter (HOSPITAL_COMMUNITY): Payer: Self-pay

## 2019-01-26 ENCOUNTER — Other Ambulatory Visit: Payer: Self-pay

## 2019-01-26 DIAGNOSIS — Z79899 Other long term (current) drug therapy: Secondary | ICD-10-CM | POA: Insufficient documentation

## 2019-01-26 DIAGNOSIS — D61818 Other pancytopenia: Secondary | ICD-10-CM | POA: Insufficient documentation

## 2019-01-26 DIAGNOSIS — Z833 Family history of diabetes mellitus: Secondary | ICD-10-CM | POA: Diagnosis not present

## 2019-01-26 DIAGNOSIS — G4733 Obstructive sleep apnea (adult) (pediatric): Secondary | ICD-10-CM | POA: Insufficient documentation

## 2019-01-26 DIAGNOSIS — N182 Chronic kidney disease, stage 2 (mild): Secondary | ICD-10-CM | POA: Insufficient documentation

## 2019-01-26 DIAGNOSIS — Z7982 Long term (current) use of aspirin: Secondary | ICD-10-CM | POA: Diagnosis not present

## 2019-01-26 DIAGNOSIS — Z8249 Family history of ischemic heart disease and other diseases of the circulatory system: Secondary | ICD-10-CM | POA: Insufficient documentation

## 2019-01-26 DIAGNOSIS — I2510 Atherosclerotic heart disease of native coronary artery without angina pectoris: Secondary | ICD-10-CM | POA: Insufficient documentation

## 2019-01-26 DIAGNOSIS — Z96652 Presence of left artificial knee joint: Secondary | ICD-10-CM | POA: Insufficient documentation

## 2019-01-26 DIAGNOSIS — Z794 Long term (current) use of insulin: Secondary | ICD-10-CM | POA: Diagnosis not present

## 2019-01-26 DIAGNOSIS — Z8261 Family history of arthritis: Secondary | ICD-10-CM | POA: Diagnosis not present

## 2019-01-26 DIAGNOSIS — Z809 Family history of malignant neoplasm, unspecified: Secondary | ICD-10-CM | POA: Insufficient documentation

## 2019-01-26 DIAGNOSIS — M1990 Unspecified osteoarthritis, unspecified site: Secondary | ICD-10-CM | POA: Insufficient documentation

## 2019-01-26 DIAGNOSIS — D696 Thrombocytopenia, unspecified: Secondary | ICD-10-CM | POA: Diagnosis not present

## 2019-01-26 DIAGNOSIS — R768 Other specified abnormal immunological findings in serum: Secondary | ICD-10-CM | POA: Diagnosis not present

## 2019-01-26 DIAGNOSIS — Z951 Presence of aortocoronary bypass graft: Secondary | ICD-10-CM | POA: Insufficient documentation

## 2019-01-26 DIAGNOSIS — E1122 Type 2 diabetes mellitus with diabetic chronic kidney disease: Secondary | ICD-10-CM | POA: Diagnosis not present

## 2019-01-26 DIAGNOSIS — I129 Hypertensive chronic kidney disease with stage 1 through stage 4 chronic kidney disease, or unspecified chronic kidney disease: Secondary | ICD-10-CM | POA: Diagnosis not present

## 2019-01-26 DIAGNOSIS — E785 Hyperlipidemia, unspecified: Secondary | ICD-10-CM | POA: Diagnosis not present

## 2019-01-26 DIAGNOSIS — D72822 Plasmacytosis: Secondary | ICD-10-CM | POA: Diagnosis not present

## 2019-01-26 LAB — CBC WITH DIFFERENTIAL/PLATELET
Abs Immature Granulocytes: 0.02 10*3/uL (ref 0.00–0.07)
Basophils Absolute: 0 10*3/uL (ref 0.0–0.1)
Basophils Relative: 1 %
Eosinophils Absolute: 0.1 10*3/uL (ref 0.0–0.5)
Eosinophils Relative: 1 %
HCT: 36.9 % — ABNORMAL LOW (ref 39.0–52.0)
Hemoglobin: 11.7 g/dL — ABNORMAL LOW (ref 13.0–17.0)
Immature Granulocytes: 0 %
Lymphocytes Relative: 26 %
Lymphs Abs: 1.7 10*3/uL (ref 0.7–4.0)
MCH: 29.8 pg (ref 26.0–34.0)
MCHC: 31.7 g/dL (ref 30.0–36.0)
MCV: 94.1 fL (ref 80.0–100.0)
Monocytes Absolute: 0.6 10*3/uL (ref 0.1–1.0)
Monocytes Relative: 10 %
Neutro Abs: 4 10*3/uL (ref 1.7–7.7)
Neutrophils Relative %: 62 %
Platelets: 83 10*3/uL — ABNORMAL LOW (ref 150–400)
RBC: 3.92 MIL/uL — ABNORMAL LOW (ref 4.22–5.81)
RDW: 18.7 % — ABNORMAL HIGH (ref 11.5–15.5)
WBC: 6.4 10*3/uL (ref 4.0–10.5)
nRBC: 0 % (ref 0.0–0.2)

## 2019-01-26 LAB — BASIC METABOLIC PANEL
Anion gap: 10 (ref 5–15)
BUN: 29 mg/dL — ABNORMAL HIGH (ref 6–20)
CO2: 24 mmol/L (ref 22–32)
Calcium: 9.2 mg/dL (ref 8.9–10.3)
Chloride: 102 mmol/L (ref 98–111)
Creatinine, Ser: 1.18 mg/dL (ref 0.61–1.24)
GFR calc Af Amer: >60 mL/min (ref >60)
GFR calc non Af Amer: >60 mL/min (ref >60)
Glucose, Bld: 97 mg/dL (ref 70–99)
Potassium: 3.8 mmol/L (ref 3.5–5.1)
Sodium: 136 mmol/L (ref 135–145)

## 2019-01-26 LAB — PROTIME-INR
INR: 1.18
Prothrombin Time: 14.9 seconds (ref 11.4–15.2)

## 2019-01-26 LAB — GLUCOSE, CAPILLARY
Glucose-Capillary: 100 mg/dL — ABNORMAL HIGH (ref 70–99)
Glucose-Capillary: 84 mg/dL (ref 70–99)

## 2019-01-26 MED ORDER — FENTANYL CITRATE (PF) 100 MCG/2ML IJ SOLN
INTRAMUSCULAR | Status: AC
  Filled 2019-01-26: qty 2

## 2019-01-26 MED ORDER — SODIUM CHLORIDE 0.9 % IV SOLN
INTRAVENOUS | Status: DC
  Administered 2019-01-26: 08:00:00 via INTRAVENOUS

## 2019-01-26 MED ORDER — MIDAZOLAM HCL 2 MG/2ML IJ SOLN
INTRAMUSCULAR | Status: AC | PRN
  Administered 2019-01-26 (×2): 1 mg via INTRAVENOUS
  Administered 2019-01-26: 2 mg via INTRAVENOUS

## 2019-01-26 MED ORDER — MIDAZOLAM HCL 2 MG/2ML IJ SOLN
INTRAMUSCULAR | Status: AC
  Filled 2019-01-26: qty 4

## 2019-01-26 MED ORDER — FENTANYL CITRATE (PF) 100 MCG/2ML IJ SOLN
INTRAMUSCULAR | Status: AC | PRN
  Administered 2019-01-26 (×2): 50 ug via INTRAVENOUS

## 2019-01-26 MED ORDER — LIDOCAINE HCL (PF) 1 % IJ SOLN
INTRAMUSCULAR | Status: AC | PRN
  Administered 2019-01-26: 10 mL

## 2019-01-26 NOTE — Consult Note (Signed)
Chief Complaint: Patient was seen in consultation today for CT guided bone marrow biopsy  Referring Physician(s): Mohamed,M  Supervising Physician: Aletta Edouard  Patient Status: [facility] - Out-pt  History of Present Illness: [name] is a 58 y.o. male with history of pancytopenia, elevated IgA levels as well as low ferritin who underwent bone marrow biopsy by oncology on 01/10/2019 with suboptimal specimen.  He presents again today for CT-guided bone marrow biopsy for further evaluation.  Past Medical History:  Diagnosis Date  . Arthritis   . CAD (coronary artery disease)    Multivessel disease status post CABG 08/2015  . CKD (chronic kidney disease), stage II   . Essential hypertension   . Hyperlipidemia   . Hypertension   . OSA on CPAP   . Type 2 diabetes mellitus ([place])     Past Surgical History:  Procedure Laterality Date  . APPENDECTOMY    . Biceps tendon surgery Right   . CARDIAC CATHETERIZATION N/A 08/25/2015   Procedure: Left Heart Cath and Coronary Angiography;  Surgeon: Belva Crome, MD;  Location: [facility];  Service: Cardiovascular;  Laterality: N/A;  . CORONARY ARTERY BYPASS GRAFT N/A 08/29/2015   Procedure: CORONARY ARTERY BYPASS GRAFTING (CABG);  Surgeon: [name], MD;  Location: [facility];  Service: Open Heart Surgery;  Laterality: N/A;  . KNEE ARTHROSCOPY Left   . TEE WITHOUT CARDIOVERSION N/A 08/29/2015   Procedure: TRANSESOPHAGEAL ECHOCARDIOGRAM (TEE);  Surgeon: [name], MD;  Location: [place];  Service: Open Heart Surgery;  Laterality: N/A;  . TOTAL KNEE ARTHROPLASTY Left 03/09/2017   Procedure: TOTAL KNEE ARTHROPLASTY;  Surgeon: Carole Civil, MD;  Location: [facility];  Service: Orthopedics;  Laterality: Left;    Allergies: Patient has no known allergies.  Medications: Prior to Admission medications   Medication Sig Start Date End Date Taking? Authorizing Provider  albuterol (PROVENTIL HFA;VENTOLIN HFA) 108  (90 Base) MCG/ACT inhaler Inhale 2 puffs into the lungs every 6 (six) hours as needed for wheezing or shortness of breath. 12/15/18  Yes Kennith Gain, MD  aspirin EC 81 MG tablet Take 81 mg by mouth at bedtime.    Yes [provider]  Flaxseed, Linseed, (FLAXSEED OIL PO) Take 500 mg by mouth daily.   Yes [provider]  furosemide (LASIX) 20 MG tablet Take 1 tablet (20 mg total) by mouth daily. 05/04/18 01/26/19 Yes Dunn, Dayna N, PA-C  hydrochlorothiazide (HYDRODIURIL) 25 MG tablet Take 12.5 mg by mouth daily.  01/21/18  Yes [provider]  insulin aspart (NOVOLOG) 100 UNIT/ML injection Inject 0.4-35 Units into the skin 3 (three) times daily before meals.    Yes [provider]  KOMBIGLYZE XR 2.04-999 MG TB24 Take 1 tablet by mouth 2 (two) times daily.  07/28/11  Yes [provider]  loratadine (CLARITIN) 10 MG tablet Take 10 mg by mouth daily.   Yes [provider]  methocarbamol (ROBAXIN) 500 MG tablet TAKE 1 TABLET(500 MG) BY MOUTH THREE TIMES DAILY 01/10/19  Yes Carole Civil, MD  metoprolol tartrate (LOPRESSOR) 25 MG tablet TAKE 1/2 TABLET(12.5 MG) BY MOUTH TWICE DAILY Patient taking differently: Take 12.5 mg by mouth 2 (two) times daily.  07/18/18  Yes Satira Sark, MD  montelukast (SINGULAIR) 10 MG tablet Take 1 tablet (10 mg total) by mouth at bedtime. 12/15/18  Yes Padgett, Rae Halsted, MD  Multiple Vitamins-Minerals (MULTIVITAMIN ADULT PO) Take 1 tablet by mouth daily.   Yes [provider]  omeprazole (PRILOSEC) 40 MG capsule Take 40 mg by mouth at bedtime. 09/13/18  Yes [provider]  pravastatin (PRAVACHOL) 20 MG tablet TAKE 1 TABLET(20 MG) BY MOUTH AT BEDTIME 01/15/19  Yes Satira Sark, MD  traMADol-acetaminophen (ULTRACET) 37.5-325 MG tablet TAKE 1 TABLET BY MOUTH EVERY 4 HOURS FOR UP TO 7 DAYS AS NEEDED FOR MODERATE PAIN 01/10/19  Yes Carole Civil, MD  TRESIBA FLEXTOUCH 200 UNIT/ML  SOPN Inject 60-70 Units into the skin See admin instructions. Inject 70 units subcutaneously in the morning and 60 units in the evening 01/02/18  Yes [provider]  TURMERIC CURCUMIN PO Take 1,000 mg by mouth daily.    Yes [provider]  Crisaborole (EUCRISA) 2 % OINT Apply 1 application topically 2 (two) times daily as needed. 12/15/18   Kennith Gain, MD  diphenhydrAMINE (BENADRYL) 50 MG tablet Take 1 tablet (50 mg total) by mouth at bedtime as needed for itching. 03/25/17   Carole Civil, MD  ezetimibe (ZETIA) 10 MG tablet Take 1 tablet (10 mg total) by mouth daily. 06/22/18 12/04/18  Satira Sark, MD  insulin lispro protamine-lispro (HUMALOG 75/25 MIX) (75-25) 100 UNIT/ML SUSP injection Inject 35-70 Units into the skin 3 (three) times daily.     [provider]  pimecrolimus (ELIDEL) 1 % cream Apply topically 2 (two) times daily. 12/19/18   Kennith Gain, MD     Family History  Problem Relation Age of Onset  . Arthritis Other   . Cancer Other   . Diabetes Other   . CAD Father   . Diabetes Mellitus II Father   . CAD Brother   . Diabetes Mellitus II Brother   . Anesthesia problems Neg Hx   . Hypotension Neg Hx   . Malignant hyperthermia Neg Hx   . Pseudochol deficiency Neg Hx     Social History   Socioeconomic History  . Marital status: Married    Spouse name: Not on file  . Number of children: Not on file  . Years of education: college  . Highest education level: Not on file  Occupational History  . Occupation: Magazine features editor: [employer]  Social Needs  . Financial resource strain: Not on file  . Food insecurity:    Worry: Not on file    Inability: Not on file  . Transportation needs:    Medical: Not on file    Non-medical: Not on file  Tobacco Use  . Smoking status: Never Smoker  . Smokeless tobacco: Never Used  Substance and Sexual Activity  . Alcohol use: No    Alcohol/week: 0.0 standard  drinks  . Drug use: No  . Sexual activity: Yes    Birth control/protection: None  Lifestyle  . Physical activity:    Days per week: Not on file    Minutes per session: Not on file  . Stress: Not on file  Relationships  . Social connections:    Talks on phone: Not on file    Gets together: Not on file    Attends religious service: Not on file    Active member of club or organization: Not on file    Attends meetings of clubs or organizations: Not on file    Relationship status: Not on file  Other Topics Concern  . Not on file  Social History Narrative  . Not on file      Review of Systems:  denies fever,  chest pain, dyspnea, cough, abdominal/back pain, nausea, vomiting or bleeding.  He does have occasional headaches.  Vital Signs: BP 138/75 (BP Location: Right Arm)   Pulse 87   Temp 97.7 F (36.5 C) (Oral)   Resp 18   SpO2 98%   Physical Exam :awake, alert.  Chest clear to auscultation bilaterally.  Heart with regular rate and rhythm, soft murmur.  Abdomen obese, soft, positive bowel sounds, nontender.  Trace pretibial edema bilaterally.  Imaging: Dg Chest 2 View  Result Date: 01/16/2019 CLINICAL DATA:  Fever, cough.  Shortness of breath. EXAM: CHEST - 2 VIEW COMPARISON:  Radiographs of February 28, 2018. FINDINGS: The heart size and mediastinal contours are within normal limits. Both lungs are clear. No pneumothorax or pleural effusion is noted. Status post coronary bypass graft. The visualized skeletal structures are unremarkable. IMPRESSION: No active cardiopulmonary disease. Electronically Signed   By: Marijo Conception, M.D.   On: 01/16/2019 08:41    Labs:  CBC: Recent Labs    05/12/18 0815 12/15/18 1525 12/29/18 1519 01/10/19 0917  WBC 4.5 6.4 4.5 2.8*  HGB 11.0* 10.1* 9.3* 9.7*  HCT 34.2* 30.2* 29.9* 31.4*  PLT 91* 87* 66* 63*    COAGS: No results for input(s): INR, APTT in the last 8760 hours.  BMP: Recent Labs    05/11/18 0835 06/07/18 1404  12/15/18 1525 12/29/18 1519  NA 133* 134* 140 137  K 4.1 3.9 3.3* 3.9  CL 98* 100 101 104  CO2 _0 GLUCOSE 144* 216* 65 92  BUN 24* 22* 20 19  CALCIUM 8.9 9.1 9.1 8.7*  CREATININE 1.40* 1.38* 1.24 1.31*  GFRNONAA 54* 55* 63 59*  GFRAA >60 >60 73 >60    LIVER FUNCTION TESTS: Recent Labs    07/10/18 0913 08/31/18 1137 12/15/18 1525 12/25/18 1223 12/29/18 1519  BILITOT 1.0 0.9 0.6  --  0.6  AST 58* 59* 54*  --  40  ALT _1 --  18  ALKPHOS 51 59 66  --  68  PROT 7.6 7.7 7.5 7.6 7.6  ALBUMIN 3.4* 3.4* 3.9  --  3.3*    TUMOR MARKERS: No results for input(s): AFPTM, CEA, CA199, CHROMGRNA in the last 8760 hours.  Assessment and Plan:  58 y.o. male with history of pancytopenia, elevated IgA levels as well as low ferritin who underwent bone marrow biopsy by oncology on 01/10/2019 with suboptimal specimen.  He presents again today for CT-guided bone marrow biopsy for further evaluation. Risks and benefits of procedure was discussed with the patient and/or patient's family including, but not limited to bleeding, infection, damage to adjacent structures or low yield requiring additional tests.  All of the questions were answered and there is agreement to proceed.  Consent signed and in chart.      Thank you for this interesting consult.  I greatly enjoyed meeting [name] and look forward to participating in their care.  A copy of this report was sent to the requesting provider on this date.  Electronically Signed: D. Rowe Robert, PA-C 01/26/2019, 8:33 AM   I spent a total of 25 minutes  in face to face in clinical consultation, greater than 50% of which was counseling/coordinating care for CT-guided bone marrow biopsy

## 2019-01-26 NOTE — Procedures (Signed)
Interventional Radiology Procedure Note  Procedure: CT guided bone marrow aspiration and biopsy  Complications: None  EBL: < 10 mL  Findings: Aspirate and core biopsy performed of bone marrow in right iliac bone.  Plan: Bedrest supine x 1 hrs  [name] T. [name], Christopher MejiaD Pager:  [phone]   

## 2019-01-26 NOTE — Discharge Instructions (Signed)
Bone Marrow Aspiration and Bone Marrow Biopsy, Adult, Care After °This sheet gives you information about how to care for yourself after your procedure. Your health care provider may also give you more specific instructions. If you have problems or questions, contact your health care provider. °What can I expect after the procedure? °After the procedure, it is common to have: °· Mild pain and tenderness. °· Swelling. °· Bruising. °Follow these instructions at home: °Puncture site care ° °  ° °· Follow instructions from your health care provider about how to take care of the puncture site. Make sure you: °? Wash your hands with soap and water before you change your bandage (dressing). If soap and water are not available, use hand sanitizer. °? Change your dressing as told by your health care provider. °· Check your puncture site every day for signs of infection. Check for: °? More redness, swelling, or pain. °? More fluid or blood. °? Warmth. °? Pus or a bad smell. °General instructions °· Take over-the-counter and prescription medicines only as told by your health care provider. °· Do not take baths, swim, or use a hot tub until your health care provider approves. Ask if you can take a shower or have a sponge bath. °· Return to your normal activities as told by your health care provider. Ask your health care provider what activities are safe for you. °· Do not drive for 24 hours if you were given a medicine to help you relax (sedative) during your procedure. °· Keep all follow-up visits as told by your health care provider. This is important. °Contact a health care provider if: °· Your pain is not controlled with medicine. °Get help right away if: °· You have a fever. °· You have more redness, swelling, or pain around the puncture site. °· You have more fluid or blood coming from the puncture site. °· Your puncture site feels warm to the touch. °· You have pus or a bad smell coming from the puncture site. °These  symptoms may represent a serious problem that is an emergency. Do not wait to see if the symptoms will go away. Get medical help right away. Call your local emergency services (911 in the [place] Do not drive yourself to the hospital. °Summary °· After the procedure, it is common to have mild pain, tenderness, swelling, and bruising. °· Follow instructions from your health care provider about how to take care of the puncture site. °· Get help right away if you have any symptoms of infection or if you have more blood or fluid coming from the puncture site. °This information is not intended to replace advice given to you by your health care provider. Make sure you discuss any questions you have with your health care provider. °Document Released: 06/18/2005 Document Revised: 03/14/2018 Document Reviewed: 05/12/2016 °Elsevier Interactive Patient Education © 2019 [employer] ° ° ° °Moderate Conscious Sedation, Adult, Care After °These instructions provide you with information about caring for yourself after your procedure. Your health care provider may also give you more specific instructions. Your treatment has been planned according to current medical practices, but problems sometimes occur. Call your health care provider if you have any problems or questions after your procedure. °What can I expect after the procedure? °After your procedure, it is common: °· To feel sleepy for several hours. °· To feel clumsy and have poor balance for several hours. °· To have poor judgment for several hours. °· To vomit if you eat too soon. °  Follow these instructions at home: °For at least 24 hours after the procedure: ° °· Do not: °? Participate in activities where you could fall or become injured. °? Drive. °? Use heavy machinery. °? Drink alcohol. °? Take sleeping pills or medicines that cause drowsiness. °? Make important decisions or sign legal documents. °? Take care of children on your own. °· Rest. °Eating and  drinking °· Follow the diet recommended by your health care provider. °· If you vomit: °? Drink water, juice, or soup when you can drink without vomiting. °? Make sure you have little or no nausea before eating solid foods. °General instructions °· Have a responsible adult stay with you until you are awake and alert. °· Take over-the-counter and prescription medicines only as told by your health care provider. °· If you smoke, do not smoke without supervision. °· Keep all follow-up visits as told by your health care provider. This is important. °Contact a health care provider if: °· You keep feeling nauseous or you keep vomiting. °· You feel light-headed. °· You develop a rash. °· You have a fever. °Get help right away if: °· You have trouble breathing. °This information is not intended to replace advice given to you by your health care provider. Make sure you discuss any questions you have with your health care provider. °Document Released: 09/19/2013 Document Revised: 05/03/2016 Document Reviewed: 03/20/2016 °Elsevier Interactive Patient Education © 2019 [employer] ° °

## 2019-02-01 ENCOUNTER — Encounter: Payer: Self-pay | Admitting: Physician Assistant

## 2019-02-01 ENCOUNTER — Inpatient Hospital Stay: Payer: Federal, State, Local not specified - PPO

## 2019-02-01 ENCOUNTER — Other Ambulatory Visit: Payer: Self-pay

## 2019-02-01 ENCOUNTER — Other Ambulatory Visit: Payer: Self-pay | Admitting: Physician Assistant

## 2019-02-01 ENCOUNTER — Inpatient Hospital Stay (HOSPITAL_BASED_OUTPATIENT_CLINIC_OR_DEPARTMENT_OTHER): Payer: Federal, State, Local not specified - PPO | Admitting: Physician Assistant

## 2019-02-01 ENCOUNTER — Telehealth: Payer: Self-pay | Admitting: Physician Assistant

## 2019-02-01 VITALS — BP 133/71 | HR 86 | Temp 97.8°F | Resp 19 | Ht 70.5 in | Wt 273.0 lb

## 2019-02-01 DIAGNOSIS — D649 Anemia, unspecified: Secondary | ICD-10-CM

## 2019-02-01 DIAGNOSIS — I2510 Atherosclerotic heart disease of native coronary artery without angina pectoris: Secondary | ICD-10-CM

## 2019-02-01 DIAGNOSIS — Z794 Long term (current) use of insulin: Secondary | ICD-10-CM | POA: Diagnosis not present

## 2019-02-01 DIAGNOSIS — Z79899 Other long term (current) drug therapy: Secondary | ICD-10-CM | POA: Diagnosis not present

## 2019-02-01 DIAGNOSIS — E119 Type 2 diabetes mellitus without complications: Secondary | ICD-10-CM | POA: Diagnosis not present

## 2019-02-01 DIAGNOSIS — N183 Chronic kidney disease, stage 3 (moderate): Secondary | ICD-10-CM | POA: Diagnosis not present

## 2019-02-01 DIAGNOSIS — D61818 Other pancytopenia: Secondary | ICD-10-CM | POA: Diagnosis not present

## 2019-02-01 DIAGNOSIS — I129 Hypertensive chronic kidney disease with stage 1 through stage 4 chronic kidney disease, or unspecified chronic kidney disease: Secondary | ICD-10-CM | POA: Diagnosis not present

## 2019-02-01 DIAGNOSIS — J45909 Unspecified asthma, uncomplicated: Secondary | ICD-10-CM | POA: Diagnosis not present

## 2019-02-01 DIAGNOSIS — D696 Thrombocytopenia, unspecified: Secondary | ICD-10-CM

## 2019-02-01 DIAGNOSIS — E785 Hyperlipidemia, unspecified: Secondary | ICD-10-CM

## 2019-02-01 DIAGNOSIS — Z7982 Long term (current) use of aspirin: Secondary | ICD-10-CM

## 2019-02-01 DIAGNOSIS — M129 Arthropathy, unspecified: Secondary | ICD-10-CM | POA: Diagnosis not present

## 2019-02-01 LAB — CBC WITH DIFFERENTIAL (CANCER CENTER ONLY)
Abs Immature Granulocytes: 0.01 10*3/uL (ref 0.00–0.07)
Basophils Absolute: 0 10*3/uL (ref 0.0–0.1)
Basophils Relative: 0 %
Eosinophils Absolute: 0.1 10*3/uL (ref 0.0–0.5)
Eosinophils Relative: 2 %
HCT: 37.2 % — ABNORMAL LOW (ref 39.0–52.0)
Hemoglobin: 11.4 g/dL — ABNORMAL LOW (ref 13.0–17.0)
Immature Granulocytes: 0 %
Lymphocytes Relative: 24 %
Lymphs Abs: 1.3 10*3/uL (ref 0.7–4.0)
MCH: 29.2 pg (ref 26.0–34.0)
MCHC: 30.6 g/dL (ref 30.0–36.0)
MCV: 95.4 fL (ref 80.0–100.0)
Monocytes Absolute: 0.5 10*3/uL (ref 0.1–1.0)
Monocytes Relative: 9 %
Neutro Abs: 3.4 10*3/uL (ref 1.7–7.7)
Neutrophils Relative %: 65 %
Platelet Count: 62 10*3/uL — ABNORMAL LOW (ref 150–400)
RBC: 3.9 MIL/uL — ABNORMAL LOW (ref 4.22–5.81)
RDW: 18.7 % — ABNORMAL HIGH (ref 11.5–15.5)
WBC Count: 5.3 10*3/uL (ref 4.0–10.5)
nRBC: 0 % (ref 0.0–0.2)

## 2019-02-01 LAB — OCCULT BLOOD X 1 CARD TO LAB, STOOL
Fecal Occult Bld: NEGATIVE
Fecal Occult Bld: NEGATIVE
Fecal Occult Bld: NEGATIVE

## 2019-02-01 NOTE — Telephone Encounter (Signed)
Scheduled appt per 2/20 los. ° °Printed calendar and avs. °

## 2019-02-01 NOTE — Progress Notes (Signed)
[facility] OFFICE PROGRESS NOTE  [name], MD [place] [id number]  DIAGNOSIS:  1) Bicytopenia/Pancytopenia  2) Elevated IgA level. 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 He is followed by allergy and asthma medicine for this problem.  PRIOR THERAPY: None  CURRENT THERAPY: OTC iron supplements  INTERVAL HISTORY: [name] 58 y.o. male returns to the clinic today for follow-up visit for his bicytopenia/pancytopenia accompanied by his wife. The patient is feeling well today with no concerning symptoms except for some mild fatigue and baseline shortness of breath.  He denies any fever, chills, night sweats, or weight loss.  Denies any chest pain, cough, palpitations, or wheezing.  He denies any nausea, vomiting, diarrhea, constipation.  He endorses mild bruising of his hands but denies any other bleeding/bruising such as hematuria, melena, or epistaxis. Patient denies any frequent or recurrent infections.  He was started on oral iron supplements at the previous visit and is tolerating those well without any adverse effects. He also was provided with occult blood stool cards which he has completed and returned today. He recently underwent a repeat bone marrow biopsy and is here today to discuss the results and treatment options.   MEDICAL HISTORY: Past Medical History:  Diagnosis Date  . Arthritis   . CAD (coronary artery disease)    Multivessel disease status post CABG 08/2015  . CKD (chronic kidney disease), stage II   . Essential hypertension   . Hyperlipidemia   . Hypertension   . OSA on CPAP   . Type 2 diabetes mellitus (HCC)     ALLERGIES:  has No Known Allergies.  MEDICATIONS:  Current Outpatient Medications  Medication Sig Dispense Refill  . albuterol (PROVENTIL HFA;VENTOLIN HFA) 108 (90 Base) MCG/ACT inhaler Inhale 2 puffs into the lungs every 6 (six) hours as needed for wheezing or  shortness of breath. 1 Inhaler 2  . aspirin EC 81 MG tablet Take 81 mg by mouth at bedtime.     [name] (EUCRISA) 2 % OINT Apply 1 application topically 2 (two) times daily as needed. 60 g 5  . diphenhydrAMINE (BENADRYL) 50 MG tablet Take 1 tablet (50 mg total) by mouth at bedtime as needed for itching. 30 tablet 0  . Flaxseed, Linseed, (FLAXSEED OIL PO) Take 500 mg by mouth daily.    . hydrochlorothiazide (HYDRODIURIL) 25 MG tablet Take 12.5 mg by mouth daily.   3  . insulin aspart (NOVOLOG) 100 UNIT/ML injection Inject 0.4-35 Units into the skin 3 (three) times daily before meals.     . insulin lispro protamine-lispro (HUMALOG 75/25 MIX) (75-25) 100 UNIT/ML SUSP injection Inject 35-70 Units into the skin 3 (three) times daily.     [name] KOMBIGLYZE XR 2.04-999 MG TB24 Take 1 tablet by mouth 2 (two) times daily.     [name] loratadine (CLARITIN) 10 MG tablet Take 10 mg by mouth daily.    . methocarbamol (ROBAXIN) 500 MG tablet TAKE 1 TABLET(500 MG) BY MOUTH THREE TIMES DAILY 56 tablet 0  . metoprolol tartrate (LOPRESSOR) 25 MG tablet TAKE 1/2 TABLET(12.5 MG) BY MOUTH TWICE DAILY (Patient taking differently: Take 12.5 mg by mouth 2 (two) times daily. ) 90 tablet 3  . montelukast (SINGULAIR) 10 MG tablet Take 1 tablet (10 mg total) by mouth at bedtime. 30 tablet 5  . Multiple Vitamins-Minerals (MULTIVITAMIN ADULT PO) Take 1 tablet by mouth daily.    [name] omeprazole (PRILOSEC) 40 MG capsule Take 40  mg by mouth at bedtime.  2  . pimecrolimus (ELIDEL) 1 % cream Apply topically 2 (two) times daily. 30 g 5  . pravastatin (PRAVACHOL) 20 MG tablet TAKE 1 TABLET(20 MG) BY MOUTH AT BEDTIME 90 tablet 3  . traMADol-acetaminophen (ULTRACET) 37.5-325 MG tablet TAKE 1 TABLET BY MOUTH EVERY 4 HOURS FOR UP TO 7 DAYS AS NEEDED FOR MODERATE PAIN 42 tablet 5  . TRESIBA FLEXTOUCH 200 UNIT/ML SOPN Inject 60-70 Units into the skin See admin instructions. Inject 70 units subcutaneously in the morning and 60 units in the evening     . TURMERIC CURCUMIN PO Take 1,000 mg by mouth daily.     [name] ezetimibe (ZETIA) 10 MG tablet Take 1 tablet (10 mg total) by mouth daily. 90 tablet 3  . furosemide (LASIX) 20 MG tablet Take 1 tablet (20 mg total) by mouth daily. 90 tablet 3   No current facility-administered medications for this visit.     SURGICAL HISTORY:  Past Surgical History:  Procedure Laterality Date  . APPENDECTOMY    . Biceps tendon surgery Right   . CARDIAC CATHETERIZATION N/A 08/25/2015   Procedure: Left Heart Cath and Coronary Angiography;  Surgeon: Belva Crome, MD;  Location: [facility];  Service: Cardiovascular;  Laterality: N/A;  . CORONARY ARTERY BYPASS GRAFT N/A 08/29/2015   Procedure: CORONARY ARTERY BYPASS GRAFTING (CABG);  Surgeon: [name], MD;  Location: [facility];  Service: Open Heart Surgery;  Laterality: N/A;  . KNEE ARTHROSCOPY Left   . TEE WITHOUT CARDIOVERSION N/A 08/29/2015   Procedure: TRANSESOPHAGEAL ECHOCARDIOGRAM (TEE);  Surgeon: [name], MD;  Location: [facility];  Service: Open Heart Surgery;  Laterality: N/A;  . TOTAL KNEE ARTHROPLASTY Left 03/09/2017   Procedure: TOTAL KNEE ARTHROPLASTY;  Surgeon: Carole Civil, MD;  Location: [facility];  Service: Orthopedics;  Laterality: Left;    REVIEW OF SYSTEMS:   Review of Systems  Constitutional: Positive for fatigue. Negative for appetite change, chills, fever and unexpected weight change.  HENT:   Negative for mouth sores, nosebleeds, sore throat and trouble swallowing.   Eyes: Negative for eye problems and icterus.  Respiratory: Positive for baseline shortness of breath. Negative for cough, hemoptysis, and wheezing.   Cardiovascular:  Negative for chest pain or palpitations Gastrointestinal: Negative for abdominal pain, constipation, diarrhea, nausea and vomiting.  Genitourinary: Negative for bladder incontinence, difficulty urinating, dysuria, frequency and hematuria.   Musculoskeletal: Negative for back pain, gait  problem, neck pain and neck stiffness.  Skin: Negative for itching and rash.  Neurological: Negative for dizziness, extremity weakness, gait problem, headaches, light-headedness and seizures.  Hematological: Negative for adenopathy. Positive for extremity bruising.  Psychiatric/Behavioral: Negative for confusion, depression and sleep disturbance. The patient is not nervous/anxious.     PHYSICAL EXAMINATION:  Blood pressure 133/71, pulse 86, temperature 97.8 F (36.6 C), temperature source Oral, resp. rate 19, height 5' 10.5" (1.791 m), weight 273 lb (123.8 kg), SpO2 99 %.  ECOG PERFORMANCE STATUS: 1 - Symptomatic but completely ambulatory  Physical Exam  Constitutional: Oriented to person, place, and time and well-developed, well-nourished, and in no distress. No distress.  HENT:  Head: Normocephalic and atraumatic.  Mouth/Throat: Oropharynx is clear and moist. No oropharyngeal exudate.  Eyes: Conjunctivae are normal. Right eye exhibits no discharge. Left eye exhibits no discharge. No scleral icterus.  Neck: Normal range of motion. Neck supple.  Cardiovascular: Normal rate, regular rhythm, normal heart sounds and intact distal pulses.   Pulmonary/Chest: Effort  normal and breath sounds normal. No respiratory distress. No wheezes. No rales.  Abdominal: Soft. Bowel sounds are normal. Exhibits no distension and no mass. There is no tenderness.  Musculoskeletal: Bilateral lower extremity edema. Normal range of motion.  Lymphadenopathy:    No cervical adenopathy.  Neurological: Alert and oriented to person, place, and time. Exhibits normal muscle tone. Gait normal. Coordination normal.  Skin: briusing on posterior surface of hand bilaterally. Skin is warm and dry. No rash noted. Not diaphoretic. No erythema. No pallor.  Psychiatric: Mood, memory and judgment normal.  Vitals reviewed.  LABORATORY DATA: Lab Results  Component Value Date   WBC 5.3 02/01/2019   HGB 11.4 (L) 02/01/2019   HCT  37.2 (L) 02/01/2019   MCV 95.4 02/01/2019   PLT 62 (L) 02/01/2019      Chemistry      Component Value Date/Time   NA 136 01/26/2019 0758   NA 140 12/15/2018 1525   K 3.8 01/26/2019 0758   CL 102 01/26/2019 0758   CO2 24 01/26/2019 0758   BUN 29 (H) 01/26/2019 0758   BUN 20 12/15/2018 1525   CREATININE 1.18 01/26/2019 0758   CREATININE 1.31 (H) 12/29/2018 1519      Component Value Date/Time   CALCIUM 9.2 01/26/2019 0758   ALKPHOS 68 12/29/2018 1519   AST 40 12/29/2018 1519   ALT 18 12/29/2018 1519   BILITOT 0.6 12/29/2018 1519       RADIOGRAPHIC STUDIES:  Dg Chest 2 View  Result Date: 01/16/2019 CLINICAL DATA:  Fever, cough.  Shortness of breath. EXAM: CHEST - 2 VIEW COMPARISON:  Radiographs of February 28, 2018. FINDINGS: The heart size and mediastinal contours are within normal limits. Both lungs are clear. No pneumothorax or pleural effusion is noted. Status post coronary bypass graft. The visualized skeletal structures are unremarkable. IMPRESSION: No active cardiopulmonary disease. Electronically Signed   By: Marijo Conception, M.D.   On: 01/16/2019 08:41   Ct Biopsy  Result Date: 01/26/2019 CLINICAL DATA:  Pancytopenia and elevated IgA level. Need for repeat bone marrow biopsy after prior bedside biopsy on 01/10/2019 did not yield an adequate sample. EXAM: CT GUIDED BONE MARROW ASPIRATION AND BIOPSY ANESTHESIA/SEDATION: Versed 4.0 mg IV, Fentanyl 100 mcg IV Total Moderate Sedation Time:   10 minutes. The patient's level of consciousness and physiologic status were continuously monitored during the procedure by Radiology nursing. PROCEDURE: The procedure risks, benefits, and alternatives were explained to the patient. Questions regarding the procedure were encouraged and answered. The patient understands and consents to the procedure. A time out was performed prior to initiating the procedure. The right gluteal region was prepped with chlorhexidine. Sterile gown and sterile gloves  were used for the procedure. Local anesthesia was provided with 1% Lidocaine. Under CT guidance, an 11 gauge On Control bone cutting needle was advanced from a posterior approach into the right iliac bone. Needle positioning was confirmed with CT. Initial non heparinized and heparinized aspirate samples were obtained of bone marrow. Core biopsy was performed via the On Control drill needle. COMPLICATIONS: None FINDINGS: Inspection of initial aspirate did reveal visible particles. Intact core biopsy sample was obtained. IMPRESSION: CT guided bone marrow biopsy of right posterior iliac bone with both aspirate and core samples obtained. Electronically Signed   By: Aletta Edouard M.D.   On: 01/26/2019 11:06   Ct Bone Marrow Biopsy & Aspiration  Result Date: 01/26/2019 CLINICAL DATA:  Pancytopenia and elevated IgA level. Need for repeat bone marrow biopsy  after prior bedside biopsy on 01/10/2019 did not yield an adequate sample. EXAM: CT GUIDED BONE MARROW ASPIRATION AND BIOPSY ANESTHESIA/SEDATION: Versed 4.0 mg IV, Fentanyl 100 mcg IV Total Moderate Sedation Time:   10 minutes. The patient's level of consciousness and physiologic status were continuously monitored during the procedure by Radiology nursing. PROCEDURE: The procedure risks, benefits, and alternatives were explained to the patient. Questions regarding the procedure were encouraged and answered. The patient understands and consents to the procedure. A time out was performed prior to initiating the procedure. The right gluteal region was prepped with chlorhexidine. Sterile gown and sterile gloves were used for the procedure. Local anesthesia was provided with 1% Lidocaine. Under CT guidance, an 11 gauge On Control bone cutting needle was advanced from a posterior approach into the right iliac bone. Needle positioning was confirmed with CT. Initial non heparinized and heparinized aspirate samples were obtained of bone marrow. Core biopsy was performed via  the On Control drill needle. COMPLICATIONS: None FINDINGS: Inspection of initial aspirate did reveal visible particles. Intact core biopsy sample was obtained. IMPRESSION: CT guided bone marrow biopsy of right posterior iliac bone with both aspirate and core samples obtained. Electronically Signed   By: Aletta Edouard M.D.   On: 01/26/2019 11:06     ASSESSMENT/PLAN:  This is a very pleasant 58 year old Caucasian male who presented for evaluation of elevated IgA levels. This is likely polyclonal gammopathy due to his allergic condition.  He is followed by an allergy and asthma medicine for this problem.  The patient was also found to have bicytopenia/pancytopenia.  Patient was seen with Dr. Julien Nordmann today.  Patient is feeling well without any concerns or complaints except for some mild fatigue, baseline shortness of breath, and bruising of the hands.  The patient recently underwent a CT-guided bone marrow biopsy.  Dr. Julien Nordmann discussed the results with the patient and his wife.  The biopsy demonstrated no concerning findings for a hematological or oncologic etiology for his bicytopenia.  His labs today continue to show thrombocytopenia with a platelet count of 62,000.  Additionally he continues to have mild anemia with a hemoglobin of 11.4.  Patient's fecal  stool cards were negative for occult blood.  Patient was advised to continue taking his over-the-counter oral iron supplements.   The patient was advised to return to his allergist for management of his condition.  We also recommended that he see a rheumatologist in the future to evaluate him for an autoimmune etiology of his pancytopenia.   The patient will return for a follow-up visit and routine labs in approximately 3-4 months.   The patient was advised to call immediately if he has any concerning symptoms in the interval. The patient voices understanding of current disease status and treatment options and is in agreement with the current care  plan. All questions were answered. The patient knows to call the clinic with any problems, questions or concerns. We can certainly see the patient much sooner if necessary.    Orders Placed This Encounter  Procedures  . CBC with Differential ([facility] Center Only)    Standing Status:   Future    Standing Expiration Date:   02/02/2020  . CMP ([facility] only)    Standing Status:   Future    Standing Expiration Date:   02/02/2020     [name] , PA-C 02/01/19  ADDENDUM: Hematology/Oncology Attending: I had a face-to-face encounter with the patient today.  I recommended his care plan.  This is  a very pleasant 58 years old white male who was evaluated for elevated IgA level.  The patient had several studies performed recently that also showed pancytopenia in addition to the elevated immunoglobulin. He had CT guided bone marrow biopsy and aspirate performed recently. I discussed the biopsy results with the patient and his wife.  His biopsy showed no concerning findings for multiple myeloma or malignancy involving the bone marrow. His pancytopenia as well as the elevated IgA level are likely autoimmune in origin. I recommended for the patient to see his allergy physician as well as referral to a rheumatologist for further evaluation of his condition.  I am not sure if the patient would benefit from prolonged course of steroids for his condition but this will be left to the rheumatologist and allergist. We will see the patient back for follow-up visit in 3 months with repeat CBC, comprehensive metabolic panel and LDH. He was advised to call immediately if he has any concerning symptoms in the interval.  Disclaimer: This note was dictated with voice recognition software. Similar sounding words can inadvertently be transcribed and may be missed upon review. Eilleen Kempf, MD 02/01/19

## 2019-02-02 ENCOUNTER — Other Ambulatory Visit: Payer: Self-pay | Admitting: Orthopedic Surgery

## 2019-02-02 DIAGNOSIS — Z96652 Presence of left artificial knee joint: Secondary | ICD-10-CM

## 2019-02-02 DIAGNOSIS — Z4889 Encounter for other specified surgical aftercare: Secondary | ICD-10-CM

## 2019-02-13 ENCOUNTER — Encounter (HOSPITAL_COMMUNITY): Payer: Self-pay | Admitting: Internal Medicine

## 2019-02-26 ENCOUNTER — Encounter: Payer: Self-pay | Admitting: Orthopedic Surgery

## 2019-02-26 ENCOUNTER — Ambulatory Visit (INDEPENDENT_AMBULATORY_CARE_PROVIDER_SITE_OTHER): Payer: Federal, State, Local not specified - PPO | Admitting: Orthopedic Surgery

## 2019-02-26 ENCOUNTER — Other Ambulatory Visit: Payer: Self-pay

## 2019-02-26 ENCOUNTER — Ambulatory Visit (INDEPENDENT_AMBULATORY_CARE_PROVIDER_SITE_OTHER): Payer: Federal, State, Local not specified - PPO

## 2019-02-26 VITALS — BP 114/69 | HR 97 | Ht 70.5 in | Wt 275.0 lb

## 2019-02-26 DIAGNOSIS — M25561 Pain in right knee: Secondary | ICD-10-CM

## 2019-02-26 DIAGNOSIS — S8001XA Contusion of right knee, initial encounter: Secondary | ICD-10-CM | POA: Diagnosis not present

## 2019-02-26 NOTE — Progress Notes (Signed)
Progress Note   Patient ID: [name], male   DOB: [birth year], 58 y.o.   MRN: [id number]   Chief Complaint  Patient presents with  . Knee Pain    Right knee pain, DOI 02-22-19.    58 year old male has been scheduled for knee replacement on several occasions but was canceled for various reasons.  He has had 3 falls over the last 6 months the last 2 caused by tripping.  He is also been seen for allergies and found out he had multiple allergies.  He was also seen by hematology for anemia and low white count has been referred for rheumatology and endoscopy with a platelet count of 62,000.  His last fall was March 12 injured his right knee complains of increased pain swelling decreased range of motion and painful weightbearing  Is also had bilateral leg edema and restarted his diuretic oral diuretics    Review of Systems  Cardiovascular: Positive for leg swelling.  Musculoskeletal: Positive for falls and joint pain.  Endo/Heme/Allergies: Positive for environmental allergies.   Past Medical History:  Diagnosis Date  . Arthritis   . CAD (coronary artery disease)    Multivessel disease status post CABG 08/2015  . CKD (chronic kidney disease), stage II   . Essential hypertension   . Hyperlipidemia   . Hypertension   . OSA on CPAP   . Type 2 diabetes mellitus (HCC)     No Known Allergies   BP 114/69   Pulse 97   Ht 5' 10.5" (1.791 m)   Wt 275 lb (124.7 kg)   BMI 38.90 kg/m   Physical Exam Vitals signs and nursing note reviewed.  Constitutional:      Appearance: Normal appearance.     Comments: He seems weak and sluggish to me pale  Cardiovascular:     Rate and Rhythm: Normal rate.  Musculoskeletal:       Legs:  Skin:    General: Skin is warm and dry.     Capillary Refill: Capillary refill takes less than 2 seconds.     Coloration: Skin is pale.     Findings: No bruising, erythema, lesion or rash.  Neurological:     General: No focal deficit present.   Mental Status: He is alert and oriented to person, place, and time.     Sensory: No sensory deficit.     Gait: Gait abnormal.  Psychiatric:        Mood and Affect: Mood normal.      Medical decisions:   Data  Imaging:   Right knee film moderate varus severe arthritis  Encounter Diagnoses  Name Primary?  . Acute pain of right knee   . Contusion of right knee, initial encounter Yes    PLAN:   Recommend ice Decreased activity and limited activity Complete medical work-up before scheduling right total knee Refill medicines for pain as needed Follow-up 3 mos    Arther Abbott, MD 02/26/2019 11:45 AM

## 2019-02-26 NOTE — Patient Instructions (Signed)
  Recommend ice Decreased activity and limited activity Complete medical work-up before scheduling right total knee Refill medicines for pain as needed Follow-up 3 mos

## 2019-03-02 ENCOUNTER — Other Ambulatory Visit: Payer: Self-pay

## 2019-03-02 MED ORDER — ALBUTEROL SULFATE HFA 108 (90 BASE) MCG/ACT IN AERS: 2.0000 | INHALATION_SPRAY | Freq: Four times a day (QID) | RESPIRATORY_TRACT | 2 refills | Status: DC | PRN

## 2019-03-05 ENCOUNTER — Other Ambulatory Visit: Payer: Self-pay | Admitting: Orthopedic Surgery

## 2019-03-05 DIAGNOSIS — Z4889 Encounter for other specified surgical aftercare: Secondary | ICD-10-CM

## 2019-03-05 DIAGNOSIS — Z96652 Presence of left artificial knee joint: Secondary | ICD-10-CM

## 2019-03-05 MED ORDER — METHOCARBAMOL 500 MG PO TABS: 500.0000 mg | ORAL_TABLET | Freq: Four times a day (QID) | ORAL | 0 refills | Status: DC

## 2019-03-07 ENCOUNTER — Telehealth: Payer: Self-pay | Admitting: Neurology

## 2019-03-07 ENCOUNTER — Encounter: Payer: Self-pay | Admitting: Neurology

## 2019-03-07 DIAGNOSIS — J209 Acute bronchitis, unspecified: Secondary | ICD-10-CM | POA: Diagnosis not present

## 2019-03-07 DIAGNOSIS — J22 Unspecified acute lower respiratory infection: Secondary | ICD-10-CM | POA: Diagnosis not present

## 2019-03-07 DIAGNOSIS — Z1389 Encounter for screening for other disorder: Secondary | ICD-10-CM | POA: Diagnosis not present

## 2019-03-07 DIAGNOSIS — Z6841 Body Mass Index (BMI) 40.0 and over, adult: Secondary | ICD-10-CM | POA: Diagnosis not present

## 2019-03-07 NOTE — Telephone Encounter (Signed)
Called the patient to inform them that our office has placed new protocols in place for our office visits. Due to the virus pandemic our office is reducing our number of office visits in order to minimize the risk to our patients and healthcare providers. Advised that our office is now providing the capability to offer the patients phone visits at this time. Informed of what that process looks like and informed that the telephone office visit will still be billed through insurance and due to [place] we need them to know since the appointment is taking place over the phone, we can't guarantee the security of the phone line. With that said if we do move forward I would have to get verbal consent to completed the call over the phone. Patient verbalized understanding and gave verbalized consent to the video/telephone apt.  I have reviewed with the pt his allergies, medication list, pharmacy and History and updated per the patient. I have reviewed with the patient the app that he should download for the video visit. I will send a email to [email or web address] and advised the patient to be ready for his apt on Monday beween 10:15/10:30 am to join meeting on the email. Pt verbalized understanding. Patient DME [employer] and he has some concerns that he was wanting to address.

## 2019-03-08 ENCOUNTER — Encounter: Payer: Self-pay | Admitting: Neurology

## 2019-03-12 ENCOUNTER — Other Ambulatory Visit: Payer: Self-pay

## 2019-03-12 ENCOUNTER — Telehealth: Payer: Self-pay | Admitting: *Deleted

## 2019-03-12 ENCOUNTER — Ambulatory Visit: Payer: Federal, State, Local not specified - PPO | Admitting: Nurse Practitioner

## 2019-03-12 ENCOUNTER — Ambulatory Visit (INDEPENDENT_AMBULATORY_CARE_PROVIDER_SITE_OTHER): Payer: Federal, State, Local not specified - PPO | Admitting: Neurology

## 2019-03-12 ENCOUNTER — Encounter: Payer: Self-pay | Admitting: Neurology

## 2019-03-12 DIAGNOSIS — Z951 Presence of aortocoronary bypass graft: Secondary | ICD-10-CM | POA: Diagnosis not present

## 2019-03-12 DIAGNOSIS — G4710 Hypersomnia, unspecified: Secondary | ICD-10-CM | POA: Diagnosis not present

## 2019-03-12 DIAGNOSIS — D890 Polyclonal hypergammaglobulinemia: Secondary | ICD-10-CM | POA: Diagnosis not present

## 2019-03-12 DIAGNOSIS — D649 Anemia, unspecified: Secondary | ICD-10-CM | POA: Diagnosis not present

## 2019-03-12 MED ORDER — ARMODAFINIL 200 MG PO TABS: ORAL_TABLET | ORAL | 5 refills | Status: DC

## 2019-03-12 NOTE — Telephone Encounter (Signed)
Submitted PA armodafinil on covermymeds. [id number].   PA approved effective 02/10/2019 through 03/11/2020.

## 2019-03-12 NOTE — Telephone Encounter (Signed)
PA approved for the patient 02/10/2019-03/11/2020 through [employer].

## 2019-03-12 NOTE — Progress Notes (Signed)
SLEEP MEDICINE CLINIC   Provider:  Larey Seat, M D  Referring Provider: [name], MD Primary Care Physician:  [name], MD    HPI:  [name] is a 58 y.o. male , seen here on 03-03-2015 as a referral from Dr. Hilma Favors for a sleep evaluation.   [name] states that he is here because his physician and his spouse are concerned about his sleep apnea. About 7 weeks ago he had presented with chest tightness and was told that he had significant coronary artery disease. Some of his vessels were 95% occluded. He underwent bypass surgery on 08-29-15, and it was in the hospital that he was observed having apnea by his nurses as well as by his spouse. I would like to allude that the patient has other risk factors such as diabetes, hypertension, hypercholesterolemia and that he used to be morbidly obese but lost about 80 pounds within the last 12 months. The patient acknowledges that he feels fatigued and daytime sleepy also this varies day by day. Often his sleep is not refreshing nonrestorative. His wife has noticed him to snore as well as witnessed apneas. Sleep habits are as follows:The patient retreats usually between 9 and 10 at night to the bedroom, he is promptly asleep. The bedroom is described as core, quiet and dark. He shares the bedroom in bed with his wife. His wife has not mentioned any restlessness kicking or thrashing. He prefers to sleep on his side and states that he cannot sleep on his back. He usually wakes up every 2 hours or so goes to the bathroom. 2-3 times at night. He goes promptly to sleep again. He rises in the morning at 6 AM and wakes spontaneously without alarm. He has not noticed a dry mouth in the morning, he may sometimes have a morning headache. He reports no numbness, no jaw pain. He has never been woken up by headaches from sleep. He reports that he dreams also his dreams are not acted out upon, nor are extremely vivid or Photographer. When he wakes up he is often on his back and not on his side. Sleep medical history and family sleep history: [name] father suffered from coronary artery disease and died of multiorgan failure. [name] younger brother also has undergone bypass surgery for coronary artery disease. His brother also has been diagnosed with obstructive sleep apnea. Social history: married, disabled due to heart disease. He does not use tobacco products and does not drink alcohol, he drinks mainly water and has 1 or 2 coffees in the morning. No soda or iced tea.   Interval history from 03/02/2016  We are meeting today to follow-up on a recent sleep study;  the patient underwent polysomnography on 11/30/2015 and was diagnosed with mild to moderate apnea at an AHI of 16.5, REM AHI of 30.7, supine AHI of 90.6. He also had the oxygen nadir as low as 79% saturation was 30 minutes of desaturation time.  For this reason I suggested strongly to use CPAP rather than a dental device. The CPAP titration followed on 12/30/2015 and reduced the AHI to 1.3 the patient did best at 9 cm water pressure was 17 m EPR I recommended an air-fit P 10 in medium size with heated humidity. The patient's compliance download today shows 100% compliance for the last 30 days 97% compliance for use over 4 hours average user time is 7 hours 31 minutes and the patient has an AHI of  0.6 at 9 cm water pressure. This is an almost complete alleviation there is no significant air leak and he has been using the same unit humidity settings for the last month. He endorsed today the Epworth sleepiness score at 4 points and the fatigue severity score at 17 points.  Interval history from 03/02/2017, the patient continues to use CPAP at 100% compliance with an average of 6 hours and 38 minutes of nightly use, with a residual AHI of 0.4 at a setting of 9 cm water pressure. 100% compliant by time and number of days -his machine should be WiFi  compatible more actual data should be obtained.  He assured me that he much more alert, able to multitask, concentrated and that his Epworth sleepiness score is 3 points, his fatigue severity score is 12 points. He lost 30 pounds since last year, he reported.   Interval history from 06 March 2018, I have the pleasure of seeing [name] today for his yearly follow-up.  His DME is [facility], he has been followed in this practice since 2016.  He is a very compliant CPAP user but this last 30-day download had a couple of gap days.  On 5 days he did not use his device he had a power outage.  Overall his compliance is still very good at 85%, average AHI is 0.7.  CPAP is set at 9 cm water pressure.  His ramp starts over 10 minutes at 5 cmH2O and he uses a 1 cm C-Flex setting.  Humidifier is set at level 2, the patient still endorses a moderate degree of fatigue and a SSS of 39, daytime sleepiness however is not present the Epworth sleepiness score was endorsed at 6 points. His sleep habits have been impaired by a shoulder injury- he cannot sleep on his preferred left side, neither on his back.  He had surgery for rotator cuff injury. No problems with anesthesia and waking up was uncomplicated.    Virtual Visit via Video Note  I connected with [name] on 03/12/19 at 10:30 AM EDT by a video enabled telemedicine application and verified that I am speaking with the correct person using two identifiers.   I discussed the limitations of evaluation and management by telemedicine and the availability of in person appointments. The patient expressed understanding and agreed to proceed.      VIDEO VISIT - 23 minutes.  Interval history from 12 March 2019.  This is a virtual visit with the patient [name]. [name] I mean by 58 year old Caucasian male with a history of coronary artery disease status post four-vessel bypass, anemia followed by oncology-hematology and obstructive sleep apnea  on CPAP.  In preparation for this video conference call we asked [name] to endorse the Epworth sleepiness score, he did this at 20 out of 24 possible points, he also endorsed fatigue and acknowledged some depression to be present.  He continues to have ankle edema has a history of right heart dysfunction, but he denies any interval history of atrial fibrillation, stroke, chest pain or shortness of breath.  I asked him questions about possible cataplectic symptoms, which he denied.  He reports that he sleeps between 8 and 9 hours each night, and that his wife has noted that he sometimes seems to still gasp for breath and may be wake up or seemingly wake up for a couple of seconds but then resumes sleeping quietly and restfully.  He does not feel that he has extreme REM pressure  he dreams vividly but not in naps, he had isolated cases of sleep paralysis.  His main problem is that he sleeps when he is not stimulated or physically active.  I reviewed his sleep records and his CPAP download, he has been my patient since 2016 he is using a Respironics C-Flex machine through [facility] his 100% compliance average user time 8 hours 45 minutes, he does have occasional air leakage but his residual AHI is only 0.4 events per hour of sleep at a CPAP setting of 9 cmH2O with an expiratory pressure relief setting of 1 cm.  Ramp time is 10 minutes start pressure of 5.  He was evaluated in May of last year by Dr. Conni Elliot for lower extremity edema and had a transthoracic echocardiography at any [facility], this was compared to a previous study from June 2018.  Left ventricle was normal with very mild left ventricular hypertrophy, systolic function was normal with an ejection fraction of about 60%, aortic valve has mild calcification mild stenosis and no significant regurgitation.  Mildly calcified annulus of the mitral valve noted.  Right and left atrium were normal in size, pericardium appeared normal Dr. Conni Elliot  felt that his right heart function was likely low normal due to obesity and obstructive sleep apnea.  I would consider the sleep apnea however to be well treated.  The patient's sleep study PSG took place on 30 November 2015 with an AHI of 16.5/h during REM sleep of 30.7/h oxygen nadir 79% SPO2.  This was followed by a CPAP titration on 30 December 2015, he did excellent at 9 cmH2O was 1 cm EPR and uses a ResMed air fit P 10 and medium size.  In my last visit with the patient in March 2019 I had also asked him to consider medical weight loss and wellness program here at [facility] health.  I reviewed the patient's medication and his hematology oncology visit.  He was seen by Dr. Lorna Few on 30 December 2018 remarkable for persistent dry cough shortness of breath with exertion but no chest pain.  No night sweats.  Hemoglobin was 10.1 hematocrit 30.2% platelet count 80 7K.  Normal quantitative immunoglobulin.  IgA was slightly elevated  I reviewed the patient's current medication which includes tramadol as needed I think this was actually prescribed after a knee surgery and may not be his daily routine.   Given that the patient has persistent hypersomnia while being well treated for obstructive sleep apnea on CPAP and has been highly compliant with this therapy, I am not sure if his continued sleepiness and fatigue at this point related to anemia or depression.  It seems that the heart function was not a big part.  I spoke with the patient about his safety when driving and he is concerned about his sleepiness.  For this reason I would consider prescribing modafinil or armodafinil.  I explained to the patient that it should not be taken with caffeine, that it is usually blood pressure neutral, that it has to be taken 12 hours before intended bedtime to not cause insomnia.  The patient agreed with a trial and I advised him that I will also inform Dr. Conni Elliot of his treatment.     Review of Systems:  03-12-2019 Out of a complete 14 system review, the patient complains of only the following symptoms, and all other reviewed systems are negative.   Epworth score was 6 in 2019 and is not 20/24 while comlpiant on CPAP THERAPY,  FSS remains 39 points, no nocturia. Ankle edema and right heart dysfunction.   No dream pressure. No chest pain, no SOB.  Agrees to feeling depressed.    Average sleep time each night is 8-9 hours, no dream enactment, no cataplexy, rare sleep paralysis   Social History   Socioeconomic History   Marital status: Married    Spouse name: Not on file   Number of children: Not on file   Years of education: college   Highest education level: Not on file  Occupational History   Occupation: Maintenance tech    Employer: [employer] resource strain: Not on file   Food insecurity:    Worry: Not on file    Inability: Not on file   Transportation needs:    Medical: Not on file    Non-medical: Not on file  Tobacco Use   Smoking status: Never Smoker   Smokeless tobacco: Never Used  Substance and Sexual Activity   Alcohol use: No    Alcohol/week: 0.0 standard drinks   Drug use: No   Sexual activity: Yes    Birth control/protection: None  Lifestyle   Physical activity:    Days per week: Not on file    Minutes per session: Not on file   Stress: Not on file  Relationships   Social connections:    Talks on phone: Not on file    Gets together: Not on file    Attends religious service: Not on file    Active member of club or organization: Not on file    Attends meetings of clubs or organizations: Not on file    Relationship status: Not on file   Intimate partner violence:    Fear of current or ex partner: Not on file    Emotionally abused: Not on file    Physically abused: Not on file    Forced sexual activity: Not on file  Other Topics Concern   Not on file  Social History Narrative   Not on file     Family History  Problem Relation Age of Onset   Arthritis Other    Cancer Other    Diabetes Other    CAD Father    Diabetes Mellitus II Father    CAD Brother    Diabetes Mellitus II Brother    Anesthesia problems Neg Hx    Hypotension Neg Hx    Malignant hyperthermia Neg Hx    Pseudochol deficiency Neg Hx     Past Medical History:  Diagnosis Date   Arthritis    CAD (coronary artery disease)    Multivessel disease status post CABG 08/2015   CKD (chronic kidney disease), stage II    Essential hypertension    Hyperlipidemia    Hypertension    OSA on CPAP    Type 2 diabetes mellitus ([address] [place] [address])     Past Surgical History:  Procedure Laterality Date   APPENDECTOMY     Biceps tendon surgery Right    CARDIAC CATHETERIZATION N/A 08/25/2015   Procedure: Left Heart Cath and Coronary Angiography;  Surgeon: Belva Crome, MD;  Location: [place] [facility];  Service: Cardiovascular;  Laterality: N/A;   CORONARY ARTERY BYPASS GRAFT N/A 08/29/2015   Procedure: CORONARY ARTERY BYPASS GRAFTING (CABG);  Surgeon: [name], MD;  Location: [place];  Service: Open Heart Surgery;  Laterality: N/A;   KNEE ARTHROSCOPY Left    TEE WITHOUT CARDIOVERSION N/A 08/29/2015   Procedure: TRANSESOPHAGEAL  ECHOCARDIOGRAM (TEE);  Surgeon: [name], MD;  Location: [place];  Service: Open Heart Surgery;  Laterality: N/A;   TOTAL KNEE ARTHROPLASTY Left 03/09/2017   Procedure: TOTAL KNEE ARTHROPLASTY;  Surgeon: Carole Civil, MD;  Location: [facility];  Service: Orthopedics;  Laterality: Left;    Current Outpatient Medications  Medication Sig Dispense Refill   albuterol (PROVENTIL HFA;VENTOLIN HFA) 108 (90 Base) MCG/ACT inhaler Inhale 2 puffs into the lungs every 6 (six) hours as needed for wheezing or shortness of breath. 1 Inhaler 2   aspirin EC 81 MG tablet Take 81 mg by mouth at bedtime.      Crisaborole (EUCRISA) 2 % OINT Apply 1 application topically 2 (two)  times daily as needed. (Patient not taking: Reported on 03/07/2019) 60 g 5   diphenhydrAMINE (BENADRYL) 50 MG tablet Take 1 tablet (50 mg total) by mouth at bedtime as needed for itching. 30 tablet 0   ezetimibe (ZETIA) 10 MG tablet Take 1 tablet (10 mg total) by mouth daily. 90 tablet 3   ezetimibe (ZETIA) 10 MG tablet Take 10 mg by mouth daily.     Flaxseed, Linseed, (FLAXSEED OIL PO) Take 500 mg by mouth daily.     furosemide (LASIX) 20 MG tablet Take 1 tablet (20 mg total) by mouth daily. 90 tablet 3   furosemide (LASIX) 20 MG tablet Take 20 mg by mouth daily.     hydrochlorothiazide (HYDRODIURIL) 25 MG tablet Take 12.5 mg by mouth daily.   3   insulin aspart (NOVOLOG) 100 UNIT/ML injection Inject 0.4-35 Units into the skin 3 (three) times daily before meals.      insulin lispro protamine-lispro (HUMALOG 75/25 MIX) (75-25) 100 UNIT/ML SUSP injection Inject 35-70 Units into the skin 3 (three) times daily.      KOMBIGLYZE XR 2.04-999 MG TB24 Take 1 tablet by mouth 2 (two) times daily.      loratadine (CLARITIN) 10 MG tablet Take 10 mg by mouth daily.     methocarbamol (ROBAXIN) 500 MG tablet Take 1 tablet (500 mg total) by mouth 4 (four) times daily. 56 tablet 0   metoprolol tartrate (LOPRESSOR) 25 MG tablet TAKE 1/2 TABLET(12.5 MG) BY MOUTH TWICE DAILY (Patient taking differently: Take 12.5 mg by mouth 2 (two) times daily. ) 90 tablet 3   montelukast (SINGULAIR) 10 MG tablet Take 1 tablet (10 mg total) by mouth at bedtime. 30 tablet 5   Multiple Vitamins-Minerals (MULTIVITAMIN ADULT PO) Take 1 tablet by mouth daily.     omeprazole (PRILOSEC) 40 MG capsule Take 40 mg by mouth at bedtime.  2   pimecrolimus (ELIDEL) 1 % cream Apply topically 2 (two) times daily. (Patient not taking: Reported on 03/07/2019) 30 g 5   pravastatin (PRAVACHOL) 20 MG tablet TAKE 1 TABLET(20 MG) BY MOUTH AT BEDTIME 90 tablet 3   traMADol-acetaminophen (ULTRACET) 37.5-325 MG tablet TAKE 1 TABLET BY MOUTH  EVERY 4 HOURS FOR UP TO 7 DAYS AS NEEDED FOR MODERATE PAIN 42 tablet 5   TRESIBA FLEXTOUCH 200 UNIT/ML SOPN Inject 60-70 Units into the skin See admin instructions. Inject 70 units subcutaneously in the morning and 60 units in the evening     TURMERIC CURCUMIN PO Take 1,000 mg by mouth daily.      No current facility-administered medications for this visit.     Allergies as of 03/12/2019   (No Known Allergies)    Vitals: There were no vitals taken for this visit. Last Weight:  Wt Readings  from Last 1 Encounters:  02/26/19 275 lb (124.7 kg)   YSH:UOHFG is no height or weight on file to calculate BMI.     Last Height:   Ht Readings from Last 1 Encounters:  02/26/19 5' 10.5" (1.791 m)    Physical exam:  General: The patient is awake, alert and appears not in acute distress. Head: Normocephalic, atraumatic. Neck is supple, but short and thick-. Mallampati 2  ,  neck circumference:18.00"  .Neurologic exam : The patient is awake and alert, oriented to place and time.  Mood and affect are appropriate.  Assessment:  After physical and neurologic examination, review of laboratory studies.   Personal review of imaging studies, reports of other /same  Imaging studies, results of polysomnography/ neurophysiology testing and pre-existing records as far as provided in visit.  His diabetes is better controlled - he has a no needle check device.  His sleep habits are stable , he rises at 6 AM.  He is a compliant CPAP user, lost power for 4 days which affected this years compliance. He felt the difference in sleep quality, wife noted jerking and snoring.     [name]:  Treatment [name] and additional workup :  I like for [name] to continue using his CPAP at the current setting he is using also a dream wear mask and a dream station CPAP machine. I see no reason to change to auto CPAP. I discussed the assessment and treatment [name] with the patient. The patient was provided an opportunity  to ask questions and all were answered. The patient agreed with the [name] and demonstrated an understanding of the instructions.   The patient was advised to call back or seek an in-person evaluation if the symptoms worsen or if the condition fails to improve as anticipated.  I provided 23 minutes of non-face-to-face time during this encounter.   Larey Seat, MD  RV in 3-4 month, after trial of NUVIGIL.     Asencion Partridge Buck Mcaffee MD  03/12/2019   CC:  Dr. Domenic Polite, Dr.Mohamed,   PCP- [name], [place], [place] [address]

## 2019-03-12 NOTE — Patient Instructions (Addendum)
Modafinil tablets What is this medicine? MODAFINIL (moe DAF i nil) is used to treat excessive sleepiness caused by certain sleep disorders. This includes narcolepsy, sleep apnea, and shift work sleep disorder.   This medicine may be used for other purposes; ask your health care provider or pharmacist if you have questions. COMMON BRAND NAME(S): Provigil  Armodafinil tablets What is this medicine? ARMODAFINIL (ar moe DAF i nil) is used to treat excessive sleepiness caused by certain sleep disorders. This includes narcolepsy, sleep apnea, and shift work sleep disorder. This medicine may be used for other purposes; ask your health care provider or pharmacist if you have questions. COMMON BRAND NAME(S): Nuvigil What should I tell my health care provider before I take this medicine? They need to know if you have any of these conditions: -bipolar disorder -depression -drug or alcohol abuse or addiction -heart disease -high blood pressure -kidney disease -liver disease -schizophrenia -suicidal thoughts, plans, or attempt; a previous suicide attempt by you or a family member -an unusual or allergic reaction to armodafinil, modafinil, medicines, foods, dyes, or preservatives -pregnant or trying to get pregnant -breast-feeding How should I use this medicine? Take this medicine by mouth with a glass of water. Follow the directions on the prescription label. Take your doses at regular intervals. Do not take your medicine more often than directed. Do not stop taking this medicine suddenly except upon the advice of your doctor. Stopping this medicine too quickly may cause serious side effects or your condition may worsen. A special MedGuide will be given to you by the pharmacist with each prescription and refill. Be sure to read this information carefully each time. Talk to your pediatrician regarding the use of this medicine in children. While this drug may be prescribed for children as young as 52  years of age for selected conditions, precautions do apply. Overdosage: If you think you have taken too much of this medicine contact a poison control center or emergency room at once. NOTE: This medicine is only for you. Do not share this medicine with others. What if I miss a dose? If you miss a dose, take it as soon as you can. If it is almost time for your next dose, take only that dose. Do not take double or extra doses. What may interact with this medicine? Do not take this medicine with any of the following medications: -amphetamine or dextroamphetamine -dexmethylphenidate or methylphenidate -MAOIs like Carbex, Eldepryl, Marplan, Nardil, and Parnate -pemoline -procarbazine This medicine may also interact with the following medications: -antifungal medicines like itraconazole or ketoconazole -barbiturates, like phenobarbital -birth control pills or other hormone-containing birth control devices or implants -carbamazepine -cyclosporine -diazepam -medicines for depression, anxiety, or psychotic disturbances -phenytoin -propranolol -triazolam -warfarin This list may not describe all possible interactions. Give your health care provider a list of all the medicines, herbs, non-prescription drugs, or dietary supplements you use. Also tell them if you smoke, drink alcohol, or use illegal drugs. Some items may interact with your medicine. What should I watch for while using this medicine? Visit your doctor or health care professional for regular checks on your progress. The full effect of this medicine may not be seen right away. This medicine may affect your concentration, function, or may hide signs that you are tired. You may get dizzy. This medicine will not eliminate your abnormal tendency to fall asleep and is not a replacement for sleep. Do not change your previous behavior regarding potentially dangerous activities, such as driving, using machinery,  or doing anything that needs  mental alertness until you know how this drug affects you. Alcohol can make you more dizzy and may interfere with your response to this medicine or your alertness. Avoid alcoholic drinks. Birth control pills may not work properly while you are taking this medicine. While using birth control pills, you will need an additional barrier method or an alternative non-hormonal method of birth control during treatment with armodafinil and for 1 month after stopping armodafinil. Talk to your doctor about which extra method of birth control is right for you. It is unknown if the effects of this medicine will be increased by the use of caffeine. Caffeine is found in many foods, beverages, and medications. Ask your doctor if you should limit or change your intake of caffeine-containing products while on this medicine. Do not stop previously prescribed treatments for your condition, such as a CPAP machine, except on the advise of your physician or health care professional. What side effects may I notice from receiving this medicine? Side effects that you should report to your doctor or health care professional as soon as possible: -allergic reactions like skin rash, itching or hives, swelling of the face, lips, or tongue -anxiety -breathing or swallowing problems -chest pain -depressed mood -elevated mood, decreased need for sleep, racing thoughts, impulsive behavior -fast, irregular heartbeat -fever with rash, swollen lymph nodes, or swelling of the face -hallucination, loss of contact with reality -increased blood pressure -mouth sores, blisters, or peeling skin -sore throat, fever, or chills -suicidal thoughts or other mood changes -tremors -vomiting Side effects that usually do not require medical attention (report to your doctor or health care professional if they continue or are bothersome): -headache -nausea, diarrhea, or stomach upset -nervousness -trouble sleeping This list may not describe all  possible side effects. Call your doctor for medical advice about side effects. You may report side effects to FDA at 1-800-FDA-1088. Where should I keep my medicine? Keep out of the reach of children. Store at room temperature between 20 and 25 degrees C (68 and 77 degrees F). Throw away any unused medicine after the expiration date. NOTE: This sheet is a summary. It may not cover all possible information. If you have questions about this medicine, talk to your doctor, pharmacist, or health care provider.  2019 Elsevier/Gold Standard (2016-05-07 18:16:30)  What should I tell my health care provider before I take this medicine? They need to know if you have any of these conditions: -history of depression, mania, or other mental disorder -kidney disease -liver disease -an unusual or allergic reaction to modafinil, other medicines, foods, dyes, or preservatives -pregnant or trying to get pregnant -breast-feeding How should I use this medicine? Take this medicine by mouth with a glass of water. Follow the directions on the prescription label. Take your doses at regular intervals. Do not take your medicine more often than directed. Do not stop taking except on your doctor's advice. A special MedGuide will be given to you by the pharmacist with each prescription and refill. Be sure to read this information carefully each time. Talk to your pediatrician regarding the use of this medicine in children. This medicine is not approved for use in children. Overdosage: If you think you have taken too much of this medicine contact a poison control center or emergency room at once. NOTE: This medicine is only for you. Do not share this medicine with others. What if I miss a dose? If you miss a dose, take it as  soon as you can. If it is almost time for your next dose, take only that dose. Do not take double or extra doses. What may interact with this medicine? Do not take this medicine with any of the  following medications: -amphetamine or dextroamphetamine -dexmethylphenidate or methylphenidate -medicines called MAO Inhibitors like Nardil, Parnate, Marplan, Eldepryl -pemoline -procarbazine This medicine may also interact with the following medications: -antifungal medicines like itraconazole or ketoconazole -barbiturates like phenobarbital -birth control pills or other hormone-containing birth control devices or implants -carbamazepine -cyclosporine -diazepam -medicines for depression, anxiety, or psychotic disturbances -phenytoin -propranolol -triazolam -warfarin This list may not describe all possible interactions. Give your health care provider a list of all the medicines, herbs, non-prescription drugs, or dietary supplements you use. Also tell them if you smoke, drink alcohol, or use illegal drugs. Some items may interact with your medicine. What should I watch for while using this medicine? Visit your doctor or health care professional for regular checks on your progress. The full effects of this medicine may not be seen right away. This medicine may affect your concentration, function, or may hide signs that you are tired. You may get dizzy. Do not drive, use machinery, or do anything that needs mental alertness until you know how this drug affects you. Alcohol can make you more dizzy and may interfere with your response to this medicine or your alertness. Avoid alcoholic drinks. Birth control pills may not work properly while you are taking this medicine. Talk to your doctor about using an extra method of birth control. It is unknown if the effects of this medicine will be increased by the use of caffeine. Caffeine is available in many foods, beverages, and medications. Ask your doctor if you should limit or change your intake of caffeine-containing products while on this medicine. What side effects may I notice from receiving this medicine? Side effects that you should report to  your doctor or health care professional as soon as possible: -allergic reactions like skin rash, itching or hives, swelling of the face, lips, or tongue -anxiety -breathing problems -chest pain -fast, irregular heartbeat -hallucinations -increased blood pressure -redness, blistering, peeling or loosening of the skin, including inside the mouth -sore throat, fever, or chills -suicidal thoughts or other mood changes -tremors -vomiting Side effects that usually do not require medical attention (report to your doctor or health care professional if they continue or are bothersome): -headache -nausea, diarrhea, or stomach upset -nervousness -trouble sleeping This list may not describe all possible side effects. Call your doctor for medical advice about side effects. You may report side effects to FDA at 1-800-FDA-1088. Where should I keep my medicine? Keep out of the reach of children. This medicine can be abused. Keep your medicine in a safe place to protect it from theft. Do not share this medicine with anyone. Selling or giving away this medicine is dangerous and against the law. This medicine may cause accidental overdose and death if taken by other adults, children, or pets. Mix any unused medicine with a substance like cat litter or coffee grounds. Then throw the medicine away in a sealed container like a sealed bag or a coffee can with a lid. Do not use the medicine after the expiration date. Store at room temperature between 20 and 25 degrees C (68 and 77 degrees F). NOTE: This sheet is a summary. It may not cover all possible information. If you have questions about this medicine, talk to your doctor, pharmacist, or health care  provider.  2019 Elsevier/Gold Standard (2014-08-20 15:34:55)

## 2019-03-31 ENCOUNTER — Other Ambulatory Visit: Payer: Self-pay | Admitting: Orthopedic Surgery

## 2019-03-31 DIAGNOSIS — Z4889 Encounter for other specified surgical aftercare: Secondary | ICD-10-CM

## 2019-03-31 DIAGNOSIS — Z96652 Presence of left artificial knee joint: Secondary | ICD-10-CM

## 2019-04-11 DIAGNOSIS — Z6841 Body Mass Index (BMI) 40.0 and over, adult: Secondary | ICD-10-CM | POA: Diagnosis not present

## 2019-04-11 DIAGNOSIS — Z1389 Encounter for screening for other disorder: Secondary | ICD-10-CM | POA: Diagnosis not present

## 2019-04-11 DIAGNOSIS — R197 Diarrhea, unspecified: Secondary | ICD-10-CM | POA: Diagnosis not present

## 2019-04-14 ENCOUNTER — Other Ambulatory Visit: Payer: Self-pay | Admitting: Physician Assistant

## 2019-04-23 ENCOUNTER — Other Ambulatory Visit: Payer: Self-pay | Admitting: Orthopedic Surgery

## 2019-04-23 DIAGNOSIS — Z4889 Encounter for other specified surgical aftercare: Secondary | ICD-10-CM

## 2019-04-23 DIAGNOSIS — Z96652 Presence of left artificial knee joint: Secondary | ICD-10-CM

## 2019-05-03 ENCOUNTER — Inpatient Hospital Stay (HOSPITAL_BASED_OUTPATIENT_CLINIC_OR_DEPARTMENT_OTHER): Payer: Federal, State, Local not specified - PPO | Admitting: Internal Medicine

## 2019-05-03 ENCOUNTER — Inpatient Hospital Stay: Payer: Federal, State, Local not specified - PPO | Attending: Internal Medicine

## 2019-05-03 ENCOUNTER — Encounter: Payer: Self-pay | Admitting: Internal Medicine

## 2019-05-03 ENCOUNTER — Other Ambulatory Visit: Payer: Self-pay

## 2019-05-03 VITALS — BP 109/60 | HR 93 | Temp 97.9°F | Resp 18 | Ht 70.5 in | Wt 275.6 lb

## 2019-05-03 DIAGNOSIS — I129 Hypertensive chronic kidney disease with stage 1 through stage 4 chronic kidney disease, or unspecified chronic kidney disease: Secondary | ICD-10-CM | POA: Diagnosis not present

## 2019-05-03 DIAGNOSIS — R7989 Other specified abnormal findings of blood chemistry: Secondary | ICD-10-CM | POA: Insufficient documentation

## 2019-05-03 DIAGNOSIS — Z79899 Other long term (current) drug therapy: Secondary | ICD-10-CM

## 2019-05-03 DIAGNOSIS — G4730 Sleep apnea, unspecified: Secondary | ICD-10-CM | POA: Diagnosis not present

## 2019-05-03 DIAGNOSIS — M129 Arthropathy, unspecified: Secondary | ICD-10-CM | POA: Diagnosis not present

## 2019-05-03 DIAGNOSIS — N183 Chronic kidney disease, stage 3 (moderate): Secondary | ICD-10-CM

## 2019-05-03 DIAGNOSIS — E1122 Type 2 diabetes mellitus with diabetic chronic kidney disease: Secondary | ICD-10-CM

## 2019-05-03 DIAGNOSIS — E785 Hyperlipidemia, unspecified: Secondary | ICD-10-CM

## 2019-05-03 DIAGNOSIS — D649 Anemia, unspecified: Secondary | ICD-10-CM | POA: Diagnosis not present

## 2019-05-03 DIAGNOSIS — Z7982 Long term (current) use of aspirin: Secondary | ICD-10-CM | POA: Insufficient documentation

## 2019-05-03 DIAGNOSIS — D696 Thrombocytopenia, unspecified: Secondary | ICD-10-CM

## 2019-05-03 DIAGNOSIS — I2510 Atherosclerotic heart disease of native coronary artery without angina pectoris: Secondary | ICD-10-CM | POA: Insufficient documentation

## 2019-05-03 DIAGNOSIS — I10 Essential (primary) hypertension: Secondary | ICD-10-CM

## 2019-05-03 DIAGNOSIS — D890 Polyclonal hypergammaglobulinemia: Secondary | ICD-10-CM

## 2019-05-03 LAB — CMP (CANCER CENTER ONLY)
ALT: 19 U/L (ref 0–44)
AST: 42 U/L — ABNORMAL HIGH (ref 15–41)
Albumin: 3.6 g/dL (ref 3.5–5.0)
Alkaline Phosphatase: 55 U/L (ref 38–126)
Anion gap: 13 (ref 5–15)
BUN: 22 mg/dL — ABNORMAL HIGH (ref 6–20)
CO2: 21 mmol/L — ABNORMAL LOW (ref 22–32)
Calcium: 9.2 mg/dL (ref 8.9–10.3)
Chloride: 103 mmol/L (ref 98–111)
Creatinine: 1.48 mg/dL — ABNORMAL HIGH (ref 0.61–1.24)
GFR, Est AFR Am: 59 mL/min — ABNORMAL LOW (ref >60)
GFR, Estimated: 51 mL/min — ABNORMAL LOW (ref >60)
Glucose, Bld: 113 mg/dL — ABNORMAL HIGH (ref 70–99)
Potassium: 4.2 mmol/L (ref 3.5–5.1)
Sodium: 137 mmol/L (ref 135–145)
Total Bilirubin: 1 mg/dL (ref 0.3–1.2)
Total Protein: 7.6 g/dL (ref 6.5–8.1)

## 2019-05-03 LAB — CBC WITH DIFFERENTIAL (CANCER CENTER ONLY)
Abs Immature Granulocytes: 0.01 10*3/uL (ref 0.00–0.07)
Basophils Absolute: 0 10*3/uL (ref 0.0–0.1)
Basophils Relative: 0 %
Eosinophils Absolute: 0.1 10*3/uL (ref 0.0–0.5)
Eosinophils Relative: 1 %
HCT: 36.9 % — ABNORMAL LOW (ref 39.0–52.0)
Hemoglobin: 12.5 g/dL — ABNORMAL LOW (ref 13.0–17.0)
Immature Granulocytes: 0 %
Lymphocytes Relative: 23 %
Lymphs Abs: 1.3 10*3/uL (ref 0.7–4.0)
MCH: 32.6 pg (ref 26.0–34.0)
MCHC: 33.9 g/dL (ref 30.0–36.0)
MCV: 96.3 fL (ref 80.0–100.0)
Monocytes Absolute: 0.7 10*3/uL (ref 0.1–1.0)
Monocytes Relative: 12 %
Neutro Abs: 3.6 10*3/uL (ref 1.7–7.7)
Neutrophils Relative %: 64 %
Platelet Count: 72 10*3/uL — ABNORMAL LOW (ref 150–400)
RBC: 3.83 MIL/uL — ABNORMAL LOW (ref 4.22–5.81)
RDW: 14.7 % (ref 11.5–15.5)
WBC Count: 5.6 10*3/uL (ref 4.0–10.5)
nRBC: 0 % (ref 0.0–0.2)

## 2019-05-03 LAB — LACTATE DEHYDROGENASE: LDH: 153 U/L (ref 98–192)

## 2019-05-03 NOTE — Progress Notes (Signed)
[name] [phone]   [phone]  OFFICE PROGRESS NOTE  [name], MD [address] [place] [address]  DIAGNOSIS: Elevated IgA level.  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   PRIOR THERAPY: Taper dose of prednisone  CURRENT THERAPY: Observation.  INTERVAL HISTORY: [name] 58 y.o. male returns to the clinic today for follow-up visit.  The patient is feeling fine today with no concerning complaints except for mild fatigue.  He denied having any current chest pain, shortness of breath, cough or hemoptysis.  He denied having any fever or chills.  He has no nausea, vomiting, diarrhea or constipation.  He has no significant weight loss or visual changes.  He felt better when he was on the prednisone taper.  He is here today for evaluation with repeat CBC, comprehensive metabolic panel and LDH.  MEDICAL HISTORY: Past Medical History:  Diagnosis Date  . Arthritis   . CAD (coronary artery disease)    Multivessel disease status post CABG 08/2015  . CKD (chronic kidney disease), stage II   . Essential hypertension   . Hyperlipidemia   . Hypertension   . OSA on CPAP   . Type 2 diabetes mellitus (HCC)     ALLERGIES:  has No Known Allergies.  MEDICATIONS:  Current Outpatient Medications  Medication Sig Dispense Refill  . albuterol (PROVENTIL HFA;VENTOLIN HFA) 108 (90 Base) MCG/ACT inhaler Inhale 2 puffs into the lungs every 6 (six) hours as needed for wheezing or shortness of breath. 1 Inhaler 2  . Armodafinil 200 MG TABS Use as needed for sleepiness, by mouth. 1 tab  before 12 noon. 30 tablet 5  . aspirin EC 81 MG tablet Take 81 mg by mouth at bedtime.     [name] (EUCRISA) 2 % OINT Apply 1 application topically 2 (two) times daily as needed. (Patient not taking: Reported on 03/07/2019) 60 g 5  . diphenhydrAMINE (BENADRYL) 50 MG tablet Take 1 tablet (50 mg total) by mouth at  bedtime as needed for itching. 30 tablet 0  . ezetimibe (ZETIA) 10 MG tablet Take 1 tablet (10 mg total) by mouth daily. 90 tablet 3  . ezetimibe (ZETIA) 10 MG tablet Take 10 mg by mouth daily.    . Flaxseed, Linseed, (FLAXSEED OIL PO) Take 500 mg by mouth daily.    . furosemide (LASIX) 20 MG tablet Take 20 mg by mouth daily.    . furosemide (LASIX) 20 MG tablet TAKE 1 TABLET BY MOUTH DAILY 90 tablet 2  . hydrochlorothiazide (HYDRODIURIL) 25 MG tablet Take 12.5 mg by mouth daily.   3  . insulin aspart (NOVOLOG) 100 UNIT/ML injection Inject 0.4-35 Units into the skin 3 (three) times daily before meals.     . insulin lispro protamine-lispro (HUMALOG 75/25 MIX) (75-25) 100 UNIT/ML SUSP injection Inject 35-70 Units into the skin 3 (three) times daily.     [name] KOMBIGLYZE XR 2.04-999 MG TB24 Take 1 tablet by mouth 2 (two) times daily.     [name] loratadine (CLARITIN) 10 MG tablet Take 10 mg by mouth daily.    . methocarbamol (ROBAXIN) 500 MG tablet TAKE 1 TABLET(500 MG) BY MOUTH THREE TIMES DAILY 56 tablet 0  . metoprolol tartrate (LOPRESSOR) 25 MG tablet TAKE 1/2 TABLET(12.5 MG) BY MOUTH TWICE DAILY (Patient taking differently: Take 12.5 mg by mouth 2 (two) times daily. ) 90 tablet 3  . montelukast (SINGULAIR) 10 MG tablet Take 1  tablet (10 mg total) by mouth at bedtime. 30 tablet 5  . Multiple Vitamins-Minerals (MULTIVITAMIN ADULT PO) Take 1 tablet by mouth daily.    [name] omeprazole (PRILOSEC) 40 MG capsule Take 40 mg by mouth at bedtime.  2  . pimecrolimus (ELIDEL) 1 % cream Apply topically 2 (two) times daily. (Patient not taking: Reported on 03/07/2019) 30 g 5  . pravastatin (PRAVACHOL) 20 MG tablet TAKE 1 TABLET(20 MG) BY MOUTH AT BEDTIME 90 tablet 3  . traMADol-acetaminophen (ULTRACET) 37.5-325 MG tablet TAKE 1 TABLET BY MOUTH EVERY 4 HOURS FOR UP TO 7 DAYS AS NEEDED FOR MODERATE PAIN 42 tablet 5  . TRESIBA FLEXTOUCH 200 UNIT/ML SOPN Inject 60-70 Units into the skin See admin instructions. Inject 70 units  subcutaneously in the morning and 60 units in the evening    . TURMERIC CURCUMIN PO Take 1,000 mg by mouth daily.      No current facility-administered medications for this visit.     SURGICAL HISTORY:  Past Surgical History:  Procedure Laterality Date  . APPENDECTOMY    . Biceps tendon surgery Right   . CARDIAC CATHETERIZATION N/A 08/25/2015   Procedure: Left Heart Cath and Coronary Angiography;  Surgeon: Belva Crome, MD;  Location: [facility];  Service: Cardiovascular;  Laterality: N/A;  . CORONARY ARTERY BYPASS GRAFT N/A 08/29/2015   Procedure: CORONARY ARTERY BYPASS GRAFTING (CABG);  Surgeon: [name], MD;  Location: [facility];  Service: Open Heart Surgery;  Laterality: N/A;  . KNEE ARTHROSCOPY Left   . TEE WITHOUT CARDIOVERSION N/A 08/29/2015   Procedure: TRANSESOPHAGEAL ECHOCARDIOGRAM (TEE);  Surgeon: [name], MD;  Location: [facility];  Service: Open Heart Surgery;  Laterality: N/A;  . TOTAL KNEE ARTHROPLASTY Left 03/09/2017   Procedure: TOTAL KNEE ARTHROPLASTY;  Surgeon: Carole Civil, MD;  Location: [facility];  Service: Orthopedics;  Laterality: Left;    REVIEW OF SYSTEMS:  A comprehensive review of systems was negative except for: Constitutional: positive for fatigue   PHYSICAL EXAMINATION: General appearance: alert, cooperative, fatigued and no distress Head: Normocephalic, without obvious abnormality, atraumatic Neck: no adenopathy, no JVD, supple, symmetrical, trachea midline and thyroid not enlarged, symmetric, no tenderness/mass/nodules Lymph nodes: Cervical, supraclavicular, and axillary nodes normal. Resp: clear to auscultation bilaterally Back: symmetric, no curvature. ROM normal. No CVA tenderness. Cardio: regular rate and rhythm, S1, S2 normal, no murmur, click, rub or gallop GI: soft, non-tender; bowel sounds normal; no masses,  no organomegaly Extremities: extremities normal, atraumatic, no cyanosis or edema  ECOG PERFORMANCE STATUS: 1 -  Symptomatic but completely ambulatory  Blood pressure 109/60, pulse 93, temperature 97.9 F (36.6 C), temperature source Oral, resp. rate 18, height 5' 10.5" (1.791 m), weight 275 lb 9.6 oz (125 kg), SpO2 97 %.  LABORATORY DATA: Lab Results  Component Value Date   WBC 5.6 05/03/2019   HGB 12.5 (L) 05/03/2019   HCT 36.9 (L) 05/03/2019   MCV 96.3 05/03/2019   PLT 72 (L) 05/03/2019      Chemistry      Component Value Date/Time   NA 136 01/26/2019 0758   NA 140 12/15/2018 1525   K 3.8 01/26/2019 0758   CL 102 01/26/2019 0758   CO2 24 01/26/2019 0758   BUN 29 (H) 01/26/2019 0758   BUN 20 12/15/2018 1525   CREATININE 1.18 01/26/2019 0758   CREATININE 1.31 (H) 12/29/2018 1519      Component Value Date/Time   CALCIUM 9.2 01/26/2019 0758   ALKPHOS  68 12/29/2018 1519   AST 40 12/29/2018 1519   ALT 18 12/29/2018 1519   BILITOT 0.6 12/29/2018 1519       RADIOGRAPHIC STUDIES: No results found.  ASSESSMENT AND PLAN: This is a very pleasant 58 years old white male with elevated IgA level likely secondary to polyclonal gammopathy and allergy. The patient is doing fine today with no concerning complaints except for mild fatigue. Repeat CBC today showed persistent mild thrombocytopenia.  He also has mild anemia but improved compared to few months ago. I discussed the lab results with the patient and recommended for him to continue on observation with repeat follow-up visit on as-needed basis. He was advised to call immediately if he has any concerning symptoms in the interval. The patient voices understanding of current disease status and treatment options and is in agreement with the current care plan.  All questions were answered. The patient knows to call the clinic with any problems, questions or concerns. We can certainly see the patient much sooner if necessary.  I spent 10 minutes counseling the patient face to face. The total time spent in the appointment was 15 minutes.   Disclaimer: This note was dictated with voice recognition software. Similar sounding words can inadvertently be transcribed and may not be corrected upon review.

## 2019-05-08 DIAGNOSIS — R197 Diarrhea, unspecified: Secondary | ICD-10-CM | POA: Diagnosis not present

## 2019-05-08 DIAGNOSIS — Z1389 Encounter for screening for other disorder: Secondary | ICD-10-CM | POA: Diagnosis not present

## 2019-05-08 DIAGNOSIS — Z6841 Body Mass Index (BMI) 40.0 and over, adult: Secondary | ICD-10-CM | POA: Diagnosis not present

## 2019-05-09 DIAGNOSIS — N189 Chronic kidney disease, unspecified: Secondary | ICD-10-CM | POA: Diagnosis not present

## 2019-05-09 DIAGNOSIS — E1165 Type 2 diabetes mellitus with hyperglycemia: Secondary | ICD-10-CM | POA: Diagnosis not present

## 2019-05-09 DIAGNOSIS — E7800 Pure hypercholesterolemia, unspecified: Secondary | ICD-10-CM | POA: Diagnosis not present

## 2019-05-09 DIAGNOSIS — E1129 Type 2 diabetes mellitus with other diabetic kidney complication: Secondary | ICD-10-CM | POA: Diagnosis not present

## 2019-05-09 DIAGNOSIS — I10 Essential (primary) hypertension: Secondary | ICD-10-CM | POA: Diagnosis not present

## 2019-05-17 ENCOUNTER — Other Ambulatory Visit: Payer: Self-pay | Admitting: *Deleted

## 2019-05-17 MED ORDER — ALBUTEROL SULFATE HFA 108 (90 BASE) MCG/ACT IN AERS: 2.0000 | INHALATION_SPRAY | Freq: Four times a day (QID) | RESPIRATORY_TRACT | 0 refills | Status: DC | PRN

## 2019-05-17 NOTE — Telephone Encounter (Signed)
Courtesy refill  

## 2019-05-17 NOTE — Telephone Encounter (Addendum)
Received 90 day request for albuterol for patient denied request due to needing an appt. Faxed back to [facility] [phone]

## 2019-05-21 ENCOUNTER — Other Ambulatory Visit: Payer: Self-pay | Admitting: Orthopedic Surgery

## 2019-05-21 DIAGNOSIS — Z96652 Presence of left artificial knee joint: Secondary | ICD-10-CM

## 2019-05-21 DIAGNOSIS — Z4889 Encounter for other specified surgical aftercare: Secondary | ICD-10-CM

## 2019-05-24 ENCOUNTER — Ambulatory Visit (INDEPENDENT_AMBULATORY_CARE_PROVIDER_SITE_OTHER): Payer: Federal, State, Local not specified - PPO | Admitting: Cardiology

## 2019-05-24 ENCOUNTER — Encounter: Payer: Self-pay | Admitting: Cardiology

## 2019-05-24 ENCOUNTER — Other Ambulatory Visit: Payer: Self-pay

## 2019-05-24 VITALS — BP 101/55 | HR 82 | Temp 97.1°F | Ht 71.0 in

## 2019-05-24 DIAGNOSIS — N183 Chronic kidney disease, stage 3 unspecified: Secondary | ICD-10-CM

## 2019-05-24 DIAGNOSIS — R0989 Other specified symptoms and signs involving the circulatory and respiratory systems: Secondary | ICD-10-CM

## 2019-05-24 DIAGNOSIS — E782 Mixed hyperlipidemia: Secondary | ICD-10-CM

## 2019-05-24 DIAGNOSIS — I10 Essential (primary) hypertension: Secondary | ICD-10-CM | POA: Diagnosis not present

## 2019-05-24 DIAGNOSIS — I25119 Atherosclerotic heart disease of native coronary artery with unspecified angina pectoris: Secondary | ICD-10-CM | POA: Diagnosis not present

## 2019-05-24 NOTE — Progress Notes (Signed)
Cardiology Office Note  Date: 05/24/2019   ID: [name], DOB 12-17-1960, MRN [id number]  PCP:  [name], MD  Cardiologist:  Rozann Lesches, MD Electrophysiologist:  None   Chief Complaint  Patient presents with   Coronary Artery Disease    History of Present Illness: [name] is a 58 y.o. male last seen in December 2019.  He presents for a routine follow-up visit.  He does not report any active angina symptoms at this time, stable NYHA class II dyspnea.  He has been working on blood glucose control through diet.  Weight has not changed significantly, but he states that his hemoglobin A1c has come down from 10 to close to 7.  He follows with an endocrinologist.  He did not undergo a right knee surgery with Dr. Aline Brochure earlier this year but plans to see him soon to schedule the operation.  Still having a lot of arthritic pain.    I reviewed his medications which are outlined below.  We discussed obtaining a follow-up fasting lipid profile.  He has been on both Zetia and Pravachol, his last LDL was only 36.  Echocardiogram from last year showed normal LVEF at 55 to 60% with mild aortic stenosis.  Past Medical History:  Diagnosis Date   Arthritis    CAD (coronary artery disease)    Multivessel disease status post CABG 08/2015   CKD (chronic kidney disease), stage II    Essential hypertension    Hyperlipidemia    Hypertension    OSA on CPAP    Type 2 diabetes mellitus ([place])     Past Surgical History:  Procedure Laterality Date   APPENDECTOMY     Biceps tendon surgery Right    CARDIAC CATHETERIZATION N/A 08/25/2015   Procedure: Left Heart Cath and Coronary Angiography;  Surgeon: Belva Crome, MD;  Location: [facility];  Service: Cardiovascular;  Laterality: N/A;   CORONARY ARTERY BYPASS GRAFT N/A 08/29/2015   Procedure: CORONARY ARTERY BYPASS GRAFTING (CABG);  Surgeon: [name], MD;  Location: [facility];  Service: Open  Heart Surgery;  Laterality: N/A;   KNEE ARTHROSCOPY Left    TEE WITHOUT CARDIOVERSION N/A 08/29/2015   Procedure: TRANSESOPHAGEAL ECHOCARDIOGRAM (TEE);  Surgeon: [name], MD;  Location: [facility];  Service: Open Heart Surgery;  Laterality: N/A;   TOTAL KNEE ARTHROPLASTY Left 03/09/2017   Procedure: TOTAL KNEE ARTHROPLASTY;  Surgeon: Carole Civil, MD;  Location: [facility];  Service: Orthopedics;  Laterality: Left;    Current Outpatient Medications  Medication Sig Dispense Refill   albuterol (VENTOLIN HFA) 108 (90 Base) MCG/ACT inhaler Inhale 2 puffs into the lungs every 6 (six) hours as needed for wheezing or shortness of breath. 1 Inhaler 0   Armodafinil 200 MG TABS Use as needed for sleepiness, by mouth. 1 tab  before 12 noon. 30 tablet 5   aspirin EC 81 MG tablet Take 81 mg by mouth at bedtime.      Crisaborole (EUCRISA) 2 % OINT Apply 1 application topically 2 (two) times daily as needed. (Patient not taking: Reported on 03/07/2019) 60 g 5   diphenhydrAMINE (BENADRYL) 50 MG tablet Take 1 tablet (50 mg total) by mouth at bedtime as needed for itching. 30 tablet 0   ezetimibe (ZETIA) 10 MG tablet Take 1 tablet (10 mg total) by mouth daily. 90 tablet 3   ezetimibe (ZETIA) 10 MG tablet Take 10 mg by mouth daily.     Flaxseed,  Linseed, (FLAXSEED OIL PO) Take 500 mg by mouth daily.     furosemide (LASIX) 20 MG tablet Take 20 mg by mouth daily.     furosemide (LASIX) 20 MG tablet TAKE 1 TABLET BY MOUTH DAILY 90 tablet 2   hydrochlorothiazide (HYDRODIURIL) 25 MG tablet Take 12.5 mg by mouth daily.   3   insulin aspart (NOVOLOG) 100 UNIT/ML injection Inject 0.4-35 Units into the skin 3 (three) times daily before meals.      insulin lispro protamine-lispro (HUMALOG 75/25 MIX) (75-25) 100 UNIT/ML SUSP injection Inject 35-70 Units into the skin 3 (three) times daily.      KOMBIGLYZE XR 2.04-999 MG TB24 Take 1 tablet by mouth 2 (two) times daily.      loratadine (CLARITIN) 10  MG tablet Take 10 mg by mouth daily.     methocarbamol (ROBAXIN) 500 MG tablet TAKE 1 TABLET(500 MG) BY MOUTH THREE TIMES DAILY 56 tablet 0   metoprolol tartrate (LOPRESSOR) 25 MG tablet TAKE 1/2 TABLET(12.5 MG) BY MOUTH TWICE DAILY (Patient taking differently: Take 12.5 mg by mouth 2 (two) times daily. ) 90 tablet 3   montelukast (SINGULAIR) 10 MG tablet Take 1 tablet (10 mg total) by mouth at bedtime. 30 tablet 5   Multiple Vitamins-Minerals (MULTIVITAMIN ADULT PO) Take 1 tablet by mouth daily.     omeprazole (PRILOSEC) 40 MG capsule Take 40 mg by mouth at bedtime.  2   pimecrolimus (ELIDEL) 1 % cream Apply topically 2 (two) times daily. (Patient not taking: Reported on 03/07/2019) 30 g 5   pravastatin (PRAVACHOL) 20 MG tablet TAKE 1 TABLET(20 MG) BY MOUTH AT BEDTIME 90 tablet 3   traMADol-acetaminophen (ULTRACET) 37.5-325 MG tablet TAKE 1 TABLET BY MOUTH EVERY 4 HOURS FOR UP TO 7 DAYS AS NEEDED FOR MODERATE PAIN 42 tablet 5   TRESIBA FLEXTOUCH 200 UNIT/ML SOPN Inject 60-70 Units into the skin See admin instructions. Inject 70 units subcutaneously in the morning and 60 units in the evening     TURMERIC CURCUMIN PO Take 1,000 mg by mouth daily.      No current facility-administered medications for this visit.    Allergies:  Patient has no known allergies.   Social History: The patient  reports that he has never smoked. He has never used smokeless tobacco. He reports that he does not drink alcohol or use drugs.   Family History: The patient's family history includes Arthritis in an other family member; CAD in his brother and father; Cancer in an other family member; Diabetes in an other family member; Diabetes Mellitus II in his brother and father.   ROS:  Please see the history of present illness. Otherwise, complete review of systems is positive for chronic knee pain, mainly right-sided.  All other systems are reviewed and negative.   Physical Exam: VS:  BP (!) 101/55    Pulse 82     Temp (!) 97.1 F (36.2 C)    Ht 5\' 11"  (1.803 m)    SpO2 97%    BMI 38.44 kg/m , BMI Body mass index is 38.44 kg/m.  Wt Readings from Last 3 Encounters:  05/03/19 275 lb 9.6 oz (125 kg)  02/26/19 275 lb (124.7 kg)  02/01/19 273 lb (123.8 kg)    General: Morbidly obese male, appears comfortable at rest. HEENT: Conjunctiva and lids normal, oropharynx clear. Neck: Supple, no elevated JVP or carotid bruits, no thyromegaly. Lungs: Clear to auscultation, nonlabored breathing at rest. Cardiac: Regular rate and rhythm, no  S3, soft systolic murmur. Abdomen: Obese, nontender, bowel sounds present, no guarding or rebound. Extremities: Mild ankle edema, distal pulses 2+. Skin: Warm and dry. Musculoskeletal: No kyphosis. Neuropsychiatric: Alert and oriented x3, affect grossly appropriate.  ECG:  An ECG dated 05/04/2018 was personally reviewed today and demonstrated:  Sinus rhythm with poor R wave progression, rule out old anterior infarct pattern, low voltage.  Recent Labwork: 05/03/2019: ALT 19; AST 42; BUN 22; Creatinine 1.48; Hemoglobin 12.5; Platelet Count 72; Potassium 4.2; Sodium 137     Component Value Date/Time   CHOL 103 08/31/2018 1137   TRIG 187 (H) 08/31/2018 1137   HDL 30 (L) 08/31/2018 1137   CHOLHDL 3.4 08/31/2018 1137   VLDL 37 08/31/2018 1137   [place] 36 08/31/2018 1137    Other Studies Reviewed Today:  Echocardiogram 05/10/2018: Study Conclusions  - Left ventricle: The cavity size was normal. Wall thickness was increased in a pattern of mild LVH. Systolic function was normal. The estimated ejection fraction was in the range of 55% to 60%. Wall motion was normal; there were no regional wall motion abnormalities. Left ventricular diastolic function parameters were normal for the patient&'s age. - Aortic valve: Moderately calcified annulus. Trileaflet; mildly calcified leaflets. There was mild stenosis. Mean gradient (S): 10 mm Hg. Peak gradient (S): 18  mm Hg. Valve area (VTI): 1.68 cm^2. - Mitral valve: Mildly calcified annulus. There was trivial regurgitation. - Right ventricle: Systolic function was low normal. - Right atrium: Central venous pressure (est): 3 mm Hg. - Atrial septum: No defect or patent foramen ovale was identified. - Tricuspid valve: There was trivial regurgitation. - Pulmonary arteries: PA peak pressure: 9 mm Hg (S). - Pericardium, extracardiac: There was no pericardial effusion.  Assessment and Plan:  1.  Multi-vessel CAD status post CABG in 2016.  LVEF is normal range and he reports no active angina symptoms or progressive shortness of breath with typical activities on medical therapy.  Continue with observation.  2.  Preoperative cardiac assessment prior to anticipated right total knee replacement by Dr. Aline Brochure under general anesthesia.  He was assessed 6 months ago with overall low to intermediate risk, class III at 6.6% chance of major adverse cardiac event by RCRI calculator.  This has not changed since that time and he should be able to proceed.  3.  Carotid bruits, only mild ICA stenosis by carotid Dopplers in 2016.  These will be repeated.  4.  Mixed hyperlipidemia.  He is on both Pravachol and Zetia.  Last LDL was 36.  Repeat fasting lipid panel.  May be able to stop Zetia.  Medication Adjustments/Labs and Tests Ordered: Current medicines are reviewed at length with the patient today.  Concerns regarding medicines are outlined above.   Tests Ordered: Orders Placed This Encounter  Procedures   US Carotid Duplex Bilateral   Lipid Profile    Medication Changes: No orders of the defined types were placed in this encounter.    Disposition:  Follow up 6 months in the [place] office.  Signed, Satira Sark, MD, [facility] 05/24/2019 2:32 PM    [place] [facility] [address], [place], [place] [address] Phone: [phone]; Fax: [phone]

## 2019-05-24 NOTE — Patient Instructions (Signed)
Medication Instructions: Your physician recommends that you continue on your current medications as directed. Please refer to the Current Medication list given to you today.   Labwork: FASTING Lipids  Procedures/Testing: Your physician has requested that you have a carotid duplex. This test is an ultrasound of the carotid arteries in your neck. It looks at blood flow through these arteries that supply the brain with blood. Allow one hour for this exam. There are no restrictions or special instructions.    Follow-Up: 6 months with Dr.McDowell  Any Additional Special Instructions Will Be Listed Below (If Applicable).     If you need a refill on your cardiac medications before your next appointment, please call your pharmacy.

## 2019-05-25 ENCOUNTER — Other Ambulatory Visit (HOSPITAL_COMMUNITY)
Admission: RE | Admit: 2019-05-25 | Discharge: 2019-05-25 | Disposition: A | Discharge status: Home / Self Care | Payer: Federal, State, Local not specified - PPO | Source: Ambulatory Visit | Attending: Cardiology | Admitting: Cardiology

## 2019-05-25 DIAGNOSIS — E782 Mixed hyperlipidemia: Secondary | ICD-10-CM | POA: Insufficient documentation

## 2019-05-25 DIAGNOSIS — I25119 Atherosclerotic heart disease of native coronary artery with unspecified angina pectoris: Secondary | ICD-10-CM | POA: Insufficient documentation

## 2019-05-25 LAB — LIPID PANEL
Cholesterol: 120 mg/dL (ref 0–200)
HDL: 38 mg/dL — ABNORMAL LOW (ref >40)
LDL Cholesterol: 55 mg/dL (ref 0–99)
Total CHOL/HDL Ratio: 3.2 RATIO
Triglycerides: 136 mg/dL (ref <150)
VLDL: 27 mg/dL (ref 0–40)

## 2019-05-28 ENCOUNTER — Telehealth: Payer: Self-pay

## 2019-05-28 ENCOUNTER — Ambulatory Visit (HOSPITAL_COMMUNITY)
Admission: RE | Admit: 2019-05-28 | Discharge: 2019-05-28 | Disposition: A | Discharge status: Home / Self Care | Payer: Federal, State, Local not specified - PPO | Source: Ambulatory Visit | Attending: Cardiology | Admitting: Cardiology

## 2019-05-28 ENCOUNTER — Other Ambulatory Visit: Payer: Self-pay

## 2019-05-28 DIAGNOSIS — E782 Mixed hyperlipidemia: Secondary | ICD-10-CM

## 2019-05-28 DIAGNOSIS — R0989 Other specified symptoms and signs involving the circulatory and respiratory systems: Secondary | ICD-10-CM | POA: Diagnosis not present

## 2019-05-28 DIAGNOSIS — I6523 Occlusion and stenosis of bilateral carotid arteries: Secondary | ICD-10-CM | POA: Diagnosis not present

## 2019-05-28 DIAGNOSIS — Z8679 Personal history of other diseases of the circulatory system: Secondary | ICD-10-CM | POA: Diagnosis not present

## 2019-05-28 NOTE — Telephone Encounter (Signed)
Pt notified, order placed for carotid US in 1 yr

## 2019-05-28 NOTE — Telephone Encounter (Signed)
-----   Message from Satira Sark, MD sent at 05/28/2019  1:44 PM EDT ----- Results reviewed.  Follow-up carotid Dopplers does show progression in atherosclerosis since last assessment in 2016, now moderate range overall.  Would repeat carotid Dopplers in 1 year, continue medical therapy.

## 2019-05-28 NOTE — Telephone Encounter (Addendum)
Pt notified, will stop zetia, mailed lab slip for repeat lipids in December

## 2019-05-28 NOTE — Telephone Encounter (Signed)
-----   Message from Satira Sark, MD sent at 05/27/2019 11:11 AM EDT ----- Results reviewed. LDL is 55. He is on Pravachol, so let's try and simplify his medications by stopping Zetia. We should repeat a lipid panel for his follow-up visit.

## 2019-05-30 ENCOUNTER — Encounter: Payer: Self-pay | Admitting: Orthopedic Surgery

## 2019-05-30 ENCOUNTER — Telehealth: Payer: Self-pay | Admitting: Radiology

## 2019-05-30 ENCOUNTER — Other Ambulatory Visit: Payer: Self-pay

## 2019-05-30 ENCOUNTER — Ambulatory Visit (INDEPENDENT_AMBULATORY_CARE_PROVIDER_SITE_OTHER): Payer: Federal, State, Local not specified - PPO | Admitting: Orthopedic Surgery

## 2019-05-30 VITALS — BP 108/64 | HR 72 | Temp 97.2°F | Ht 71.0 in | Wt 274.0 lb

## 2019-05-30 DIAGNOSIS — M1711 Unilateral primary osteoarthritis, right knee: Secondary | ICD-10-CM

## 2019-05-30 DIAGNOSIS — I6523 Occlusion and stenosis of bilateral carotid arteries: Secondary | ICD-10-CM

## 2019-05-30 NOTE — Progress Notes (Signed)
Chief Complaint  Patient presents with  . Knee Pain    Rt knee f/u fall   58 year old male for right total knee  He had to see 2 specialist 1 for his heart and he was cleared see Dr. Myles Gip note which basically says he has low to intermediate risk.  He did have some carotid stenosis which did not need further work-up at this time his ejection fraction was 50 to 60%  He also saw hematology and he has IgG allergic antibody response.  His platelet count was 72,000 on last screening which does indicate some possibilities for more bleeding than normal although his pre-and post op trans-Amick acid should help with that.  We can use intraoperative Surgicel as needed as well.  Review of Systems  Respiratory: Negative for shortness of breath.   Cardiovascular: Negative for chest pain.  Musculoskeletal: Positive for falls and joint pain.    BP 108/64   Pulse 72   Ht 5\' 11"  (1.803 m)   Wt 274 lb (124.3 kg)   BMI 38.22 kg/m   Physical Exam Constitutional:      General: He is not in acute distress.    Appearance: Normal appearance.  Musculoskeletal:     Right lower leg: 1+ Edema present.  Skin:      Neurological:     Mental Status: He is alert.     Right knee flexion contracture 5 degrees knee flexion 105 degrees Stable in all planes Skin looks clean   Schedule RT TKA   Saw 2 subspecialists both cleared   Encounter Diagnosis  Name Primary?  . Primary osteoarthritis of right knee Yes

## 2019-05-30 NOTE — Telephone Encounter (Signed)
Patient will call me back to discuss surgery date after he talks to his wife. He is total knee will need additional lab work PT INR and A1C / right total knee replacement

## 2019-05-31 DIAGNOSIS — R05 Cough: Secondary | ICD-10-CM | POA: Diagnosis not present

## 2019-05-31 DIAGNOSIS — Z6841 Body Mass Index (BMI) 40.0 and over, adult: Secondary | ICD-10-CM | POA: Diagnosis not present

## 2019-06-05 ENCOUNTER — Telehealth: Payer: Self-pay | Admitting: Cardiology

## 2019-06-05 NOTE — Telephone Encounter (Signed)
Patient has c/o of feet swelling and one leg swelling. No SOB. Please advise / tg

## 2019-06-05 NOTE — Telephone Encounter (Signed)
Spoke with pt who states that lower extremities are swollen, states that it started yesterday. Pt states that Rt leg is more swollen than the left. Pt denies that leg is warm and red to touch. Pt also denies SOB, CP, and being dizzy. States that Rt leg feels tight. Current weight is 276 lbs and he states that he is down 2 lbs. Please advise.

## 2019-06-06 ENCOUNTER — Other Ambulatory Visit: Payer: Self-pay | Admitting: Cardiology

## 2019-06-06 NOTE — Telephone Encounter (Signed)
    Thanks for the update. If no improvement in symptoms with Lasix, would plan to obtain a d-dimer to rule-out a DVT.   Signed, Erma Heritage, PA-C 06/06/2019, 10:51 AM Pager: [phone]

## 2019-06-06 NOTE — Telephone Encounter (Signed)
   Has he experienced any pain along his lower right leg? Any recent travel or surgeries? If swelling in both legs, that would make a clot less likely unless he answers yes to one of those questions.   He is listed as taking Lasix 20mg  daily. Would have him take an extra tablet for the next 2-3 days and report back on symptoms. Would elevate his lower extremities as well.   Signed, Erma Heritage, PA-C 06/06/2019, 7:52 AM Pager: [phone]

## 2019-06-06 NOTE — Telephone Encounter (Signed)
Pt states that he has not had any surgeries or traveled lately. Reports only feeling pain when applying pressures to the leg. Pt informed how to take Lasix over the next 2-3 days. Pt voiced understanding.

## 2019-06-11 ENCOUNTER — Other Ambulatory Visit: Payer: Self-pay | Admitting: Orthopedic Surgery

## 2019-06-11 ENCOUNTER — Other Ambulatory Visit: Payer: Self-pay | Admitting: *Deleted

## 2019-06-11 DIAGNOSIS — M25561 Pain in right knee: Secondary | ICD-10-CM

## 2019-06-11 DIAGNOSIS — G8929 Other chronic pain: Secondary | ICD-10-CM

## 2019-06-11 MED ORDER — ALBUTEROL SULFATE HFA 108 (90 BASE) MCG/ACT IN AERS: 2.0000 | INHALATION_SPRAY | Freq: Four times a day (QID) | RESPIRATORY_TRACT | 0 refills | Status: DC | PRN

## 2019-06-11 NOTE — Telephone Encounter (Signed)
Sent in albuterol inhaler advised to make appt in pharmacy notes

## 2019-06-14 DIAGNOSIS — Z1389 Encounter for screening for other disorder: Secondary | ICD-10-CM | POA: Diagnosis not present

## 2019-06-14 DIAGNOSIS — Z6841 Body Mass Index (BMI) 40.0 and over, adult: Secondary | ICD-10-CM | POA: Diagnosis not present

## 2019-06-14 DIAGNOSIS — K529 Noninfective gastroenteritis and colitis, unspecified: Secondary | ICD-10-CM | POA: Diagnosis not present

## 2019-06-14 NOTE — Telephone Encounter (Signed)
Request received through interface; also received via Fax, "2nd request", [employer], [address], [place] Tramadol/APAP 37.5MG /325MG  TABS / 1 Tablet by mouth every 4 hours for up to 7 days as needed / #42

## 2019-06-19 ENCOUNTER — Other Ambulatory Visit: Payer: Self-pay

## 2019-06-19 ENCOUNTER — Encounter: Payer: Self-pay | Admitting: Neurology

## 2019-06-19 ENCOUNTER — Ambulatory Visit (INDEPENDENT_AMBULATORY_CARE_PROVIDER_SITE_OTHER): Payer: Federal, State, Local not specified - PPO | Admitting: Neurology

## 2019-06-19 VITALS — BP 122/73 | HR 83 | Temp 97.2°F | Ht 70.0 in | Wt 267.0 lb

## 2019-06-19 DIAGNOSIS — I6523 Occlusion and stenosis of bilateral carotid arteries: Secondary | ICD-10-CM

## 2019-06-19 DIAGNOSIS — G4733 Obstructive sleep apnea (adult) (pediatric): Secondary | ICD-10-CM

## 2019-06-19 DIAGNOSIS — Z9989 Dependence on other enabling machines and devices: Secondary | ICD-10-CM | POA: Diagnosis not present

## 2019-06-19 MED ORDER — ARMODAFINIL 200 MG PO TABS: ORAL_TABLET | ORAL | 5 refills | Status: DC

## 2019-06-19 NOTE — Progress Notes (Signed)
SLEEP MEDICINE CLINIC   Provider:  Larey Seat, M D  Referring Provider: [name], MD Primary Care Physician:  [name], MD      Interval history : 06-19-2019. This is a revisit for [name],  58 year old caucasian right handed married male with Bypass surgery vessels 4.  Presenting with CPAP compliance data  And is suing OSA treatment by CPAP with since January 2017. He reached a BMI 38.31 kg/m2. under the corona pandemic he reported having less work and he is feeling well.  The patient is followed by our [facility] and uses a dream station auto CPAP.  30 days included the last night to be the night to 19 June 2019 with 100% days of use and 87% of Spiriva 4 hours of consecutive use.  The average AHI was 0.3 with a CPAP set at 9 cm water pressure.  Ramp time was 10 minutes and stop pressure is 5 cm water.  1 cm C-Flex setting which equals expiratory pressure relief in this model. In our last visit we had also discussed help to help with the excessive daytime fatigue and I suggested to try Nuvigil.  Today's Epworth score is endorsed at only 4 out of 24 points and seems to have improved greatly, the fatigue severity scale is still at 42 out of 63 points but this has been actually present for well over 3 years.     HPI:  [name] is a 58 y.o. male , who had been seen here on 03-03-2015 upon  referral from Dr. Hilma Favors for a sleep evaluation the first time. .   Mr. Wingard states that he is here because his physician and his spouse are concerned about his sleep apnea. About 7 weeks ago he had presented with chest tightness and was told that he had significant coronary artery disease. Some of his vessels were 95% occluded. He underwent bypass surgery on 08-29-15, and it was in the hospital that he was observed having apnea by his nurses as well as by his spouse. I would like to allude that the patient has other risk factors such as diabetes, hypertension,  hypercholesterolemia and that he used to be morbidly obese but lost about 80 pounds within the last 12 months. The patient acknowledges that he feels fatigued and daytime sleepy also this varies day by day. Often his sleep is not refreshing nonrestorative. His wife has noticed him to snore as well as witnessed apneas. Sleep habits are as follows:The patient retreats usually between 9 and 10 at night to the bedroom, he is promptly asleep. The bedroom is described as core, quiet and dark. He shares the bedroom in bed with his wife. His wife has not mentioned any restlessness kicking or thrashing. He prefers to sleep on his side and states that he cannot sleep on his back. He usually wakes up every 2 hours or so goes to the bathroom. 2-3 times at night. He goes promptly to sleep again. He rises in the morning at 6 AM and wakes spontaneously without alarm. He has not noticed a dry mouth in the morning, he may sometimes have a morning headache. He reports no numbness, no jaw pain. He has never been woken up by headaches from sleep. He reports that he dreams also his dreams are not acted out upon, nor are extremely vivid or Water quality scientist. When he wakes up he is often on his back and not on his side. Sleep medical history and  family sleep history: Mr. Segreto father suffered from coronary artery disease and died of multiorgan failure. Mr. Valbuena younger brother also has undergone bypass surgery for coronary artery disease. His brother also has been diagnosed with obstructive sleep apnea. Social history: married, disabled due to heart disease. He does not use tobacco products and does not drink alcohol, he drinks mainly water and has 1 or 2 coffees in the morning. No soda or iced tea.   Interval history from 03/02/2016  We are meeting today to follow-up on a recent sleep study;  the patient underwent polysomnography on 11/30/2015 and was diagnosed with mild to moderate apnea at an AHI of 16.5,  REM AHI of 30.7, supine AHI of 90.6. He also had the oxygen nadir as low as 79% saturation was 30 minutes of desaturation time.  For this reason I suggested strongly to use CPAP rather than a dental device. The CPAP titration followed on 12/30/2015 and reduced the AHI to 1.3 the patient did best at 9 cm water pressure was 17 m EPR I recommended an air-fit P 10 in medium size with heated humidity. The patient's compliance download today shows 100% compliance for the last 30 days 97% compliance for use over 4 hours average user time is 7 hours 31 minutes and the patient has an AHI of 0.6 at 9 cm water pressure. This is an almost complete alleviation there is no significant air leak and he has been using the same unit humidity settings for the last month. He endorsed today the Epworth sleepiness score at 4 points and the fatigue severity score at 17 points.  Interval history from 03/02/2017, the patient continues to use CPAP at 100% compliance with an average of 6 hours and 38 minutes of nightly use, with a residual AHI of 0.4 at a setting of 9 cm water pressure. 100% compliant by time and number of days -his machine should be WiFi compatible more actual data should be obtained.  He assured me that he much more alert, able to multitask, concentrated and that his Epworth sleepiness score is 3 points, his fatigue severity score is 12 points. He lost 30 pounds since last year, he reported.   Interval history from 06 March 2018, I have the pleasure of seeing [name] today for his yearly follow-up.  His DME is [facility], he has been followed in this practice since 2016.  He is a very compliant CPAP user but this last 30-day download had a couple of gap days.  On 5 days he did not use his device he had a power outage.  Overall his compliance is still very good at 85%, average AHI is 0.7.  CPAP is set at 9 cm water pressure.  His ramp starts over 10 minutes at 5 cmH2O and he uses a 1 cm C-Flex setting.   Humidifier is set at level 2, the patient still endorses a moderate degree of fatigue and a SSS of 39, daytime sleepiness however is not present the Epworth sleepiness score was endorsed at 6 points. His sleep habits have been impaired by a shoulder injury- he cannot sleep on his preferred left side, neither on his back.  He had surgery for rotator cuff injury. No problems with anesthesia and waking up was uncomplicated.    Virtual Visit via Video Note  I connected with [name]  by a video enabled telemedicine application and verified that I am speaking with the correct person using two identifiers.   I  discussed the limitations of evaluation and management by telemedicine and the availability of in person appointments. The patient expressed understanding and agreed to proceed.      VIDEO VISIT - 23 minutes.  Interval history from 12 March 2019.  This is a virtual visit with the patient [name]. Marvel Plan I mean by 58 year old Caucasian male with a history of coronary artery disease status post four-vessel bypass, anemia followed by oncology-hematology and obstructive sleep apnea on CPAP.  In preparation for this video conference call we asked Mr. Wing to endorse the Epworth sleepiness score, he did this at 20 out of 24 possible points, he also endorsed fatigue and acknowledged some depression to be present.  He continues to have ankle edema has a history of right heart dysfunction, but he denies any interval history of atrial fibrillation, stroke, chest pain or shortness of breath.  I asked him questions about possible cataplectic symptoms, which he denied.  He reports that he sleeps between 8 and 9 hours each night, and that his wife has noted that he sometimes seems to still gasp for breath and may be wake up or seemingly wake up for a couple of seconds but then resumes sleeping quietly and restfully.  He does not feel that he has extreme REM pressure he dreams vividly but not  in naps, he had isolated cases of sleep paralysis.  His main problem is that he sleeps when he is not stimulated or physically active.  I reviewed his sleep records and his CPAP download, he has been my patient since 2016 he is using a Respironics C-Flex machine through [facility] his 100% compliance average user time 8 hours 45 minutes, he does have occasional air leakage but his residual AHI is only 0.4 events per hour of sleep at a CPAP setting of 9 cmH2O with an expiratory pressure relief setting of 1 cm.  Ramp time is 10 minutes start pressure of 5.  He was evaluated in May of last year by Dr. Conni Elliot for lower extremity edema and had a transthoracic echocardiography at any [facility], this was compared to a previous study from June 2018.  Left ventricle was normal with very mild left ventricular hypertrophy, systolic function was normal with an ejection fraction of about 60%, aortic valve has mild calcification mild stenosis and no significant regurgitation.  Mildly calcified annulus of the mitral valve noted.  Right and left atrium were normal in size, pericardium appeared normal Dr. Conni Elliot felt that his right heart function was likely low normal due to obesity and obstructive sleep apnea.  I would consider the sleep apnea however to be well treated.  The patient's sleep study PSG took place on 30 November 2015 with an AHI of 16.5/h during REM sleep of 30.7/h oxygen nadir 79% SPO2.  This was followed by a CPAP titration on 30 December 2015, he did excellent at 9 cmH2O was 1 cm EPR and uses a ResMed air fit P 10 and medium size.  In my last visit with the patient in March 2019 I had also asked him to consider medical weight loss and wellness program here at [facility].  I reviewed the patient's medication and his hematology oncology visit.  He was seen by Dr. Lorna Few on 30 December 2018 remarkable for persistent dry cough shortness of breath with exertion but no chest pain.  No night sweats.   Hemoglobin was 10.1 hematocrit 30.2% platelet count 80 7K.  Normal quantitative immunoglobulin.  IgA was slightly  elevated  I reviewed the patient's current medication which includes tramadol as needed I think this was actually prescribed after a knee surgery and may not be his daily routine.   Given that the patient has persistent hypersomnia while being well treated for obstructive sleep apnea on CPAP and has been highly compliant with this therapy, I am not sure if his continued sleepiness and fatigue at this point related to anemia or depression.  It seems that the heart function was not a big part.  I spoke with the patient about his safety when driving and he is concerned about his sleepiness.  For this reason I would consider prescribing modafinil or armodafinil.  I explained to the patient that it should not be taken with caffeine, that it is usually blood pressure neutral, that it has to be taken 12 hours before intended bedtime to not cause insomnia.  The patient agreed with a trial and I advised him that I will also inform Dr. Conni Elliot of his treatment.     Review of Systems: 03-12-2019 Out of a complete 14 system review, the patient complains of only the following symptoms, and all other reviewed systems are negative.   Epworth score was 6 in 2019 and is not 20/24 while comlpiant on CPAP THERAPY,  FSS remains 39 points, no nocturia. Ankle edema and right heart dysfunction.   No dream pressure. No chest pain, no SOB.  Agrees to feeling depressed and fatigued but less sleepy on NUVIGIL>     Average sleep time each night is 8-9 hours, no dream enactment, no cataplexy, rare sleep paralysis.  How likely are you to doze in the following situations: 0 = not likely, 1 = slight chance, 2 = moderate chance, 3 = high chance  Sitting and Reading?1 Watching Television?1 Sitting inactive in a public place (theater or meeting)?1 Lying down in the afternoon when circumstances permit? Sitting  and talking to someone? Sitting quietly after lunch without alcohol? In a car, while stopped for a few minutes in traffic? As a passenger in a car for an hour without a break?1  Total = 4/ 24      Social History   Socioeconomic History  . Marital status: Married    Spouse name: Not on file  . Number of children: Not on file  . Years of education: college  . Highest education level: Not on file  Occupational History  . Occupation: Magazine features editor: [employer]  Social Needs  . Financial resource strain: Not on file  . Food insecurity    Worry: Not on file    Inability: Not on file  . Transportation needs    Medical: Not on file    Non-medical: Not on file  Tobacco Use  . Smoking status: Never Smoker  . Smokeless tobacco: Never Used  Substance and Sexual Activity  . Alcohol use: No    Alcohol/week: 0.0 standard drinks  . Drug use: No  . Sexual activity: Yes    Birth control/protection: None  Lifestyle  . Physical activity    Days per week: Not on file    Minutes per session: Not on file  . Stress: Not on file  Relationships  . Social Herbalist on phone: Not on file    Gets together: Not on file    Attends religious service: Not on file    Active member of club or organization: Not on file    Attends meetings of  clubs or organizations: Not on file    Relationship status: Not on file  . Intimate partner violence    Fear of current or ex partner: Not on file    Emotionally abused: Not on file    Physically abused: Not on file    Forced sexual activity: Not on file  Other Topics Concern  . Not on file  Social History Narrative  . Not on file    Family History  Problem Relation Age of Onset  . Arthritis Other   . Cancer Other   . Diabetes Other   . CAD Father   . Diabetes Mellitus II Father   . CAD Brother   . Diabetes Mellitus II Brother   . Anesthesia problems Neg Hx   . Hypotension Neg Hx   . Malignant hyperthermia Neg Hx    . Pseudochol deficiency Neg Hx     Past Medical History:  Diagnosis Date  . Arthritis   . CAD (coronary artery disease)    Multivessel disease status post CABG 08/2015  . CKD (chronic kidney disease), stage II   . Essential hypertension   . Hyperlipidemia   . Hypertension   . OSA on CPAP   . Type 2 diabetes mellitus ([place])     Past Surgical History:  Procedure Laterality Date  . APPENDECTOMY    . Biceps tendon surgery Right   . CARDIAC CATHETERIZATION N/A 08/25/2015   Procedure: Left Heart Cath and Coronary Angiography;  Surgeon: Belva Crome, MD;  Location: [facility];  Service: Cardiovascular;  Laterality: N/A;  . CORONARY ARTERY BYPASS GRAFT N/A 08/29/2015   Procedure: CORONARY ARTERY BYPASS GRAFTING (CABG);  Surgeon: [name], MD;  Location: [facility];  Service: Open Heart Surgery;  Laterality: N/A;  . KNEE ARTHROSCOPY Left   . TEE WITHOUT CARDIOVERSION N/A 08/29/2015   Procedure: TRANSESOPHAGEAL ECHOCARDIOGRAM (TEE);  Surgeon: [name], MD;  Location: [facility];  Service: Open Heart Surgery;  Laterality: N/A;  . TOTAL KNEE ARTHROPLASTY Left 03/09/2017   Procedure: TOTAL KNEE ARTHROPLASTY;  Surgeon: Carole Civil, MD;  Location: [facility];  Service: Orthopedics;  Laterality: Left;    Current Outpatient Medications  Medication Sig Dispense Refill  . albuterol (VENTOLIN HFA) 108 (90 Base) MCG/ACT inhaler Inhale 2 puffs into the lungs every 6 (six) hours as needed for wheezing or shortness of breath. 18 g 0  . Armodafinil 200 MG TABS Use as needed for sleepiness, by mouth. 1 tab  before 12 noon. 30 tablet 5  . aspirin EC 81 MG tablet Take 81 mg by mouth at bedtime.     [name] (EUCRISA) 2 % OINT Apply 1 application topically 2 (two) times daily as needed. 60 g 5  . diphenhydrAMINE (BENADRYL) 50 MG tablet Take 1 tablet (50 mg total) by mouth at bedtime as needed for itching. 30 tablet 0  . Flaxseed, Linseed, (FLAXSEED OIL PO) Take 500 mg by mouth  daily.    . furosemide (LASIX) 20 MG tablet Take 20 mg by mouth daily.    . furosemide (LASIX) 20 MG tablet TAKE 1 TABLET BY MOUTH DAILY 90 tablet 2  . hydrochlorothiazide (HYDRODIURIL) 25 MG tablet Take 12.5 mg by mouth daily.   3  . insulin aspart (NOVOLOG) 100 UNIT/ML injection Inject 0.4-35 Units into the skin 3 (three) times daily before meals.     . insulin lispro protamine-lispro (HUMALOG 75/25 MIX) (75-25) 100 UNIT/ML SUSP injection Inject 35-70 Units into the  skin 3 (three) times daily.     [name] KOMBIGLYZE XR 2.04-999 MG TB24 Take 1 tablet by mouth 2 (two) times daily.     [name] loratadine (CLARITIN) 10 MG tablet Take 10 mg by mouth daily.    . methocarbamol (ROBAXIN) 500 MG tablet TAKE 1 TABLET(500 MG) BY MOUTH THREE TIMES DAILY 56 tablet 0  . metoprolol tartrate (LOPRESSOR) 25 MG tablet TAKE 1/2 TABLET(12.5 MG) BY MOUTH TWICE DAILY (Patient taking differently: Take 12.5 mg by mouth 2 (two) times daily. ) 90 tablet 3  . montelukast (SINGULAIR) 10 MG tablet Take 1 tablet (10 mg total) by mouth at bedtime. 30 tablet 5  . Multiple Vitamins-Minerals (MULTIVITAMIN ADULT PO) Take 1 tablet by mouth daily.    [name] olmesartan (BENICAR) 40 MG tablet TK 1 T PO QD    . omeprazole (PRILOSEC) 40 MG capsule Take 40 mg by mouth at bedtime.  2  . pimecrolimus (ELIDEL) 1 % cream Apply topically 2 (two) times daily. 30 g 5  . pravastatin (PRAVACHOL) 20 MG tablet TAKE 1 TABLET(20 MG) BY MOUTH AT BEDTIME 90 tablet 3  . traMADol-acetaminophen (ULTRACET) 37.5-325 MG tablet Take 1 tablet by mouth every 4 (four) hours as needed for up to 7 days for moderate pain. 42 tablet 0  . TRESIBA FLEXTOUCH 200 UNIT/ML SOPN Inject 60-70 Units into the skin See admin instructions. Inject 70 units subcutaneously in the morning and 60 units in the evening    . TURMERIC CURCUMIN PO Take 1,000 mg by mouth daily.      No current facility-administered medications for this visit.     Allergies as of 06/19/2019  . (No Known Allergies)     Vitals: BP 122/73   Pulse 83   Temp (!) 97.2 F (36.2 C)   Ht 5\' 10"  (1.778 m)   Wt 267 lb (121.1 kg)   BMI 38.31 kg/m  Last Weight:  Wt Readings from Last 1 Encounters:  06/19/19 267 lb (121.1 kg)   GQB:VQXI mass index is 38.31 kg/m.     Last Height:   Ht Readings from Last 1 Encounters:  06/19/19 5\' 10"  (1.778 m)    Physical exam:  General: The patient is awake, alert and appears not in acute distress. Head: Normocephalic, atraumatic. Neck is supple, but short and thick-. Mallampati 2  ,  neck circumference:18.00"  .Neurologic exam : The patient is awake and alert, oriented to place and time.  Mood and affect are appropriate.  Assessment:  After physical and neurologic examination, review of laboratory studies. Personal review of imaging studies, reports of other /same  Imaging studies, results of polysomnography/ neurophysiology testing and pre-existing records as far as provided in visit.  His diabetes is better controlled - he has a no needle check device.  His sleep habits are stable , he rises at 6 AM. Even during the pandemic.  He is a compliant CPAP user, lost power for 4 days which affected this years compliance. He felt the difference in sleep quality, wife noted jerking and snoring.     Plan:  Treatment plan and additional workup : 06-19-2019 I like for Mr. Acklin to continue using his CPAP at the current setting he is using also a dream wear mask and a dream station CPAP machine. I see no reason to change to auto-settings on CPAP. He tolerated NUVIGIl very well, no palpitations, he also cut down from 4 to one cup of coffee, has achieved better blood glucose control I discussed  the assessment and treatment plan with the patient.  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     I provided 23 minutes of -face-to-face time  during this encounter.   Larey Seat, MD  RV in 3-4 month, after trial of NUVIGIL.     Asencion Partridge Japneet Staggs MD  06/19/2019   CC:  Dr. Domenic Polite, Dr.Mohamed,   PCP- [name], [place] [address] [place],  [place] [address]

## 2019-06-20 ENCOUNTER — Encounter: Payer: Self-pay | Admitting: Internal Medicine

## 2019-07-04 ENCOUNTER — Other Ambulatory Visit: Payer: Self-pay | Admitting: Orthopedic Surgery

## 2019-07-04 DIAGNOSIS — G8929 Other chronic pain: Secondary | ICD-10-CM

## 2019-07-05 ENCOUNTER — Other Ambulatory Visit: Payer: Self-pay | Admitting: Orthopedic Surgery

## 2019-07-05 MED ORDER — TRAMADOL-ACETAMINOPHEN 37.5-325 MG PO TABS: 1.0000 | ORAL_TABLET | ORAL | 0 refills | Status: DC | PRN

## 2019-07-13 ENCOUNTER — Other Ambulatory Visit: Payer: Self-pay | Admitting: Cardiology

## 2019-07-17 ENCOUNTER — Other Ambulatory Visit: Payer: Self-pay | Admitting: *Deleted

## 2019-07-17 DIAGNOSIS — R197 Diarrhea, unspecified: Secondary | ICD-10-CM | POA: Insufficient documentation

## 2019-07-17 NOTE — Assessment & Plan Note (Deleted)
Patient with history of thrombocytopenia and anemia at least since 2016. Within the last year hemoglobin has been anywhere in the 9-12 range. Most recently in May 2020 at 12.5, but was improved from 11.4 in February. Platelets have been between 63-91 over the last year. Most recently at 91. Oncology saw him this year to evaluate him. Bone marrow biopsy without concerning findings for a hematological or oncologic etiology for his bicytopenia. 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 Last seen in May 2020 and oncology planning for patient to follow-up as needed as he is stable.   Has not been recently evaluated for liver etiology recently. US abdomen and pelvis with mild increased parenchymal echogenicity. Acute hepatitis panel negative in Jan 2020. LFTs on 05/03/19: AST 42, ALT 19, Alk phos 55. AST has been mildly elevated since 2016. Only 1 occasions without elevation, which was 12/29/18 with AST 40.  Stool heme negative on 02/01/19. 12/30/18: Ferritin 11, Iron 45, TIBC 480, Sat 9%.

## 2019-07-17 NOTE — Progress Notes (Deleted)
Referring Provider: Collene Mares, PA-C Primary Care Physician:  [name], MD Primary Gastroenterologist:  Dr. Gala Romney  No chief complaint on file.   HPI:   [name] is a 58 y.o. male presenting today at the request of Collene Mares, PA-C for diarrhea. Past medical history significant for T2DM, OSA on CPAP, HTN, HLD, CKD, CAD s/p CABG in 2016, Anemia, thrombocytopenia, and polyclonal gammopathy who has been evaluated by oncology and follows with allergy/asthma center.   12/30/18: Ferritin 11, Iron 45, TIBC 480, Sat 9%.  Hemoglobin 02/01/19: Stool heme negative [place] 2017 with mild increased parenchymal echogenicity.  LFTs on 05/03/19: AST 42, ALT 19, Alk phos 55. AST has been mildly elevated since 2016. Only 1 occasions without elevation, which was 12/29/18 with AST 40.   5/21 Platelets 72. Stable over the last 6 months. Been low at least since 2016.  Hemoglobin 12.5 which has improved.  12/30/18: Acute Hep Panel Negative  THS normal on 05/04/18  Last TCS on 05/01/10 with normal colon. Repeat  In 10 years.   Today:     Past Medical History:  Diagnosis Date  . Arthritis   . CAD (coronary artery disease)    Multivessel disease status post CABG 08/2015  . CKD (chronic kidney disease), stage II   . Essential hypertension   . Hyperlipidemia   . Hypertension   . OSA on CPAP   . Type 2 diabetes mellitus ([place])     Past Surgical History:  Procedure Laterality Date  . APPENDECTOMY    . Biceps tendon surgery Right   . CARDIAC CATHETERIZATION N/A 08/25/2015   Procedure: Left Heart Cath and Coronary Angiography;  Surgeon: Belva Crome, MD;  Location: [facility];  Service: Cardiovascular;  Laterality: N/A;  . CORONARY ARTERY BYPASS GRAFT N/A 08/29/2015   Procedure: CORONARY ARTERY BYPASS GRAFTING (CABG);  Surgeon: [name], MD;  Location: [facility];  Service: Open Heart Surgery;  Laterality: N/A;  . KNEE ARTHROSCOPY Left   . TEE WITHOUT CARDIOVERSION N/A  08/29/2015   Procedure: TRANSESOPHAGEAL ECHOCARDIOGRAM (TEE);  Surgeon: [name], MD;  Location: [facility];  Service: Open Heart Surgery;  Laterality: N/A;  . TOTAL KNEE ARTHROPLASTY Left 03/09/2017   Procedure: TOTAL KNEE ARTHROPLASTY;  Surgeon: Carole Civil, MD;  Location: [facility];  Service: Orthopedics;  Laterality: Left;    Current Outpatient Medications  Medication Sig Dispense Refill  . albuterol (VENTOLIN HFA) 108 (90 Base) MCG/ACT inhaler Inhale 2 puffs into the lungs every 6 (six) hours as needed for wheezing or shortness of breath. 18 g 0  . Armodafinil 200 MG TABS Use as needed for sleepiness, by mouth. 1 tab  before 12 noon. 30 tablet 5  . aspirin EC 81 MG tablet Take 81 mg by mouth at bedtime.     [name] (EUCRISA) 2 % OINT Apply 1 application topically 2 (two) times daily as needed. 60 g 5  . diphenhydrAMINE (BENADRYL) 50 MG tablet Take 1 tablet (50 mg total) by mouth at bedtime as needed for itching. 30 tablet 0  . Flaxseed, Linseed, (FLAXSEED OIL PO) Take 500 mg by mouth daily.    . furosemide (LASIX) 20 MG tablet Take 20 mg by mouth daily.    . furosemide (LASIX) 20 MG tablet TAKE 1 TABLET BY MOUTH DAILY 90 tablet 2  . hydrochlorothiazide (HYDRODIURIL) 25 MG tablet Take 12.5 mg by mouth daily.   3  . insulin aspart (NOVOLOG) 100 UNIT/ML injection  Inject 0.4-35 Units into the skin 3 (three) times daily before meals.     . insulin lispro protamine-lispro (HUMALOG 75/25 MIX) (75-25) 100 UNIT/ML SUSP injection Inject 35-70 Units into the skin 3 (three) times daily.     [name] KOMBIGLYZE XR 2.04-999 MG TB24 Take 1 tablet by mouth 2 (two) times daily.     [name] loratadine (CLARITIN) 10 MG tablet Take 10 mg by mouth daily.    . methocarbamol (ROBAXIN) 500 MG tablet TAKE 1 TABLET(500 MG) BY MOUTH THREE TIMES DAILY 56 tablet 0  . metoprolol tartrate (LOPRESSOR) 25 MG tablet TAKE 1/2 TABLET(12.5 MG) BY MOUTH TWICE DAILY 90 tablet 3  . montelukast (SINGULAIR) 10 MG tablet Take 1  tablet (10 mg total) by mouth at bedtime. 30 tablet 5  . Multiple Vitamins-Minerals (MULTIVITAMIN ADULT PO) Take 1 tablet by mouth daily.    [name] olmesartan (BENICAR) 40 MG tablet TK 1 T PO QD    . omeprazole (PRILOSEC) 40 MG capsule Take 40 mg by mouth at bedtime.  2  . pimecrolimus (ELIDEL) 1 % cream Apply topically 2 (two) times daily. 30 g 5  . pravastatin (PRAVACHOL) 20 MG tablet TAKE 1 TABLET(20 MG) BY MOUTH AT BEDTIME 90 tablet 3  . TRESIBA FLEXTOUCH 200 UNIT/ML SOPN Inject 60-70 Units into the skin See admin instructions. Inject 70 units subcutaneously in the morning and 60 units in the evening    . TURMERIC CURCUMIN PO Take 1,000 mg by mouth daily.      No current facility-administered medications for this visit.     Allergies as of 07/18/2019  . (No Known Allergies)    Family History  Problem Relation Age of Onset  . Arthritis Other   . Cancer Other   . Diabetes Other   . CAD Father   . Diabetes Mellitus II Father   . CAD Brother   . Diabetes Mellitus II Brother   . Anesthesia problems Neg Hx   . Hypotension Neg Hx   . Malignant hyperthermia Neg Hx   . Pseudochol deficiency Neg Hx     Social History   Socioeconomic History  . Marital status: Married    Spouse name: Not on file  . Number of children: Not on file  . Years of education: college  . Highest education level: Not on file  Occupational History  . Occupation: Magazine features editor: [employer]  Social Needs  . Financial resource strain: Not on file  . Food insecurity    Worry: Not on file    Inability: Not on file  . Transportation needs    Medical: Not on file    Non-medical: Not on file  Tobacco Use  . Smoking status: Never Smoker  . Smokeless tobacco: Never Used  Substance and Sexual Activity  . Alcohol use: No    Alcohol/week: 0.0 standard drinks  . Drug use: No  . Sexual activity: Yes    Birth control/protection: None  Lifestyle  . Physical activity    Days per week: Not on  file    Minutes per session: Not on file  . Stress: Not on file  Relationships  . Social Herbalist on phone: Not on file    Gets together: Not on file    Attends religious service: Not on file    Active member of club or organization: Not on file    Attends meetings of clubs or organizations: Not on file  Relationship status: Not on file  . Intimate partner violence    Fear of current or ex partner: Not on file    Emotionally abused: Not on file    Physically abused: Not on file    Forced sexual activity: Not on file  Other Topics Concern  . Not on file  Social History Narrative  . Not on file    Review of Systems: Gen: Denies any fever, chills, fatigue, weight loss, lack of appetite.  CV: Denies chest pain, heart palpitations, peripheral edema, syncope.  Resp: Denies shortness of breath at rest or with exertion. Denies wheezing or cough.  GI: Denies dysphagia or odynophagia. Denies jaundice, hematemesis, fecal incontinence. GU : Denies urinary burning, urinary frequency, urinary hesitancy MS: Denies joint pain, muscle weakness, cramps, or limitation of movement.  Derm: Denies rash, itching, dry skin Psych: Denies depression, anxiety, memory loss, and confusion Heme: Denies bruising, bleeding, and enlarged lymph nodes.  Physical Exam: There were no vitals taken for this visit. General:   Alert and oriented. Pleasant and cooperative. Well-nourished and well-developed.  Head:  Normocephalic and atraumatic. Eyes:  Without icterus, sclera clear and conjunctiva pink.  Ears:  Normal auditory acuity. Nose:  No deformity, discharge,  or lesions. Mouth:  No deformity or lesions, oral mucosa pink.  Neck:  Supple, without mass or thyromegaly. Lungs:  Clear to auscultation bilaterally. No wheezes, rales, or rhonchi. No distress.  Heart:  S1, S2 present without murmurs appreciated.  Abdomen:  +BS, soft, non-tender and non-distended. No HSM noted. No guarding or rebound. No  masses appreciated.  Rectal:  Deferred  Msk:  Symmetrical without gross deformities. Normal posture. Pulses:  Normal pulses noted. Extremities:  Without clubbing or edema. Neurologic:  Alert and  oriented x4;  grossly normal neurologically. Skin:  Intact without significant lesions or rashes. Cervical Nodes:  No significant cervical adenopathy. Psych:  Alert and cooperative. Normal mood and affect.

## 2019-07-17 NOTE — Telephone Encounter (Signed)
Denied refill to albuterol he has had 2 courtesy refill faxed back to [facility] [phone]. Needs office visit

## 2019-07-18 ENCOUNTER — Ambulatory Visit: Payer: Federal, State, Local not specified - PPO | Admitting: Gastroenterology

## 2019-07-18 DIAGNOSIS — J019 Acute sinusitis, unspecified: Secondary | ICD-10-CM | POA: Diagnosis not present

## 2019-07-18 NOTE — Assessment & Plan Note (Deleted)
Patient with history of anemia as well as thrombocytopenia at least since 2016.  Iron studies in Jan 2020 with Ferritin 11, Iron 45, TIBC 480, Sat 9%. Stool heme negative on 02/01/19. Within the last year hemoglobin has been anywhere in the 9-12 range. Most recently in May 2020 at 12.5, but was improved from 11.4 in February. Platelets have been between 63-91 over the last year. Most recently at 36. Oncology saw him this year. Bone marrow biopsy without concerning findings for a hematological or oncologic etiology for his bicytopenia. 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 Last seen in May 2020 and oncology planning for patient to follow-up as needed as he is stable. Last TCS in 2011 normal. Recommended repeat in 2011.   At this time, I feel completing TCS and EGD  Has not been recently evaluated for liver etiology recently. US abdomen and pelvis with mild increased parenchymal echogenicity. Acute hepatitis panel negative in Jan 2020. LFTs on 05/03/19: AST 42, ALT 19, Alk phos 55. AST has been mildly elevated since 2016. Only 1 occasions without elevation, which was 12/29/18 with AST 40.  12/30/18: Ferritin 11, Iron 45, TIBC 480, Sat 9%.

## 2019-08-01 ENCOUNTER — Other Ambulatory Visit: Payer: Self-pay | Admitting: Cardiology

## 2019-08-22 NOTE — Progress Notes (Signed)
Referring Provider: Ginger Organ Primary Care Physician:  [name], MD Primary Gastroenterologist:  Dr. Gala Romney  Chief Complaint  Patient presents with  . Diarrhea    occ    HPI:   [name] is a 58 y.o. male presenting today at the request of Cory Munch, PA-C for diarrhea. Past medical history of type 2 diabetes, HTN, HLD, CKD, CAD s/p CABG in 2016, arthritis, polyclonal gammopathy, thrombocytopenia, and anemia with iron deficiency.   Patient was last seen by our staff in 2011 at the time of his colonoscopy which identified normal rectum and colon. Recommended repeat in 10 years.   Reviewed recent PCP note from 06/14/2019.  Patient presented with 2 weeks of persistent watery diarrhea.  Had prior episode for several weeks in April without discovering a cause with stool studies, thyroid evaluation, medication reconciliation.  As no etiology has been identified they recommended following up with GI.    Today he states he had an episode of diarrhea in April that resolved after about 3 to 4 weeks.  Diarrhea started again in July but worse compared to diarrhea in April.  Reports PCP tested his stool again which was negative for infection.  Stools are typically mushy. Rarely watery.  Often postprandial.  Associated urgency.  No nocturnal stools.  Diarrhea starting to improve at this point.  Having about 2 BMs daily.  Was up to 6 times daily at the onset. No bright red blood per rectum or black stools. Some lower abdominal pain when diarrhea started in July. Was intermittent for a few days but has resolved.  Was not associated with BMs. No nausea or vomiting. No fever or chills. Reports being on Levaquin in July, but diarrhea was already present. No recent hospitalizations, sick contacts, well water, or contact with livestock. No medication changes.  No gas or bloating. No history of IBD in the family. No NSAIDs. Dairy consumption a few time a week.  Has a history of lactose  intolerance. If he eats more fiber, he doesn't have as much diarrhea.   Has a history of acid reflux but symptoms are well controlled on omeprazole daily.  No dysphagia.   He has had thrombocytopenia and isolated mild elevation of AST since 2016. Denies any alcohol since 1982. Has never used any drugs. No known exposure to hepatitis C. Never worked in [employer]. No home tattoos. No yellowing of skin or eyes. No confusion. Now abdominal swelling.  Admits to lower extremity pitting edema which he was told was related to his heart and easy bruising. No bleeding.   Has been working on weight loss due to needing a knee replacement.  Reports weight loss of about 40 lbs in the last 6 months. In review of documented weights in our system, he has lost about 5 lbs since January 2020.    Past Medical History:  Diagnosis Date  . Anemia 2016   iron deficiency noted in 2018.   . Arthritis   . CAD (coronary artery disease)    Multivessel disease status post CABG 08/2015  . CKD (chronic kidney disease), stage II   . Essential hypertension   . Hyperlipidemia   . Hypertension   . OSA on CPAP   . Thrombocytopenia ([place]) 2016  . Type 2 diabetes mellitus ([place])     Past Surgical History:  Procedure Laterality Date  . APPENDECTOMY    . Biceps tendon surgery Right   . CARDIAC CATHETERIZATION N/A 08/25/2015   Procedure:  Left Heart Cath and Coronary Angiography;  Surgeon: Belva Crome, MD;  Location: [facility];  Service: Cardiovascular;  Laterality: N/A;  . CORONARY ARTERY BYPASS GRAFT N/A 08/29/2015   Procedure: CORONARY ARTERY BYPASS GRAFTING (CABG);  Surgeon: [name], MD;  Location: [facility];  Service: Open Heart Surgery;  Laterality: N/A;  . KNEE ARTHROSCOPY Left   . TEE WITHOUT CARDIOVERSION N/A 08/29/2015   Procedure: TRANSESOPHAGEAL ECHOCARDIOGRAM (TEE);  Surgeon: [name], MD;  Location: [facility];  Service: Open Heart Surgery;  Laterality: N/A;  . TOTAL KNEE ARTHROPLASTY  Left 03/09/2017   Procedure: TOTAL KNEE ARTHROPLASTY;  Surgeon: Carole Civil, MD;  Location: [facility];  Service: Orthopedics;  Laterality: Left;    Current Outpatient Medications  Medication Sig Dispense Refill  . albuterol (VENTOLIN HFA) 108 (90 Base) MCG/ACT inhaler Inhale 2 puffs into the lungs every 6 (six) hours as needed for wheezing or shortness of breath. 18 g 0  . Armodafinil 200 MG TABS Use as needed for sleepiness, by mouth. 1 tab  before 12 noon. 30 tablet 5  . aspirin EC 81 MG tablet Take 81 mg by mouth at bedtime.     [name] (EUCRISA) 2 % OINT Apply 1 application topically 2 (two) times daily as needed. 60 g 5  . diphenhydrAMINE (BENADRYL) 50 MG tablet Take 1 tablet (50 mg total) by mouth at bedtime as needed for itching. 30 tablet 0  . FERROUS SULFATE PO Take 500 mg by mouth.     . Flaxseed, Linseed, (FLAXSEED OIL PO) Take 500 mg by mouth daily.    . furosemide (LASIX) 20 MG tablet TAKE 1 TABLET BY MOUTH DAILY 90 tablet 2  . hydrochlorothiazide (HYDRODIURIL) 25 MG tablet Take 12.5 mg by mouth daily.   3  . insulin aspart (NOVOLOG) 100 UNIT/ML injection Inject 0.4-35 Units into the skin 3 (three) times daily before meals.     [name] KOMBIGLYZE XR 2.04-999 MG TB24 Take 1 tablet by mouth 2 (two) times daily.     [name] loratadine (CLARITIN) 10 MG tablet Take 10 mg by mouth daily.    . methocarbamol (ROBAXIN) 500 MG tablet TAKE 1 TABLET(500 MG) BY MOUTH THREE TIMES DAILY 56 tablet 0  . metoprolol tartrate (LOPRESSOR) 25 MG tablet TAKE 1/2 TABLET(12.5 MG) BY MOUTH TWICE DAILY 90 tablet 3  . montelukast (SINGULAIR) 10 MG tablet Take 1 tablet (10 mg total) by mouth at bedtime. 30 tablet 5  . Multiple Vitamins-Minerals (MULTIVITAMIN ADULT PO) Take 1 tablet by mouth daily.    [name] olmesartan (BENICAR) 40 MG tablet TK 1 T PO QD    . omeprazole (PRILOSEC) 40 MG capsule Take 40 mg by mouth at bedtime.  2  . pravastatin (PRAVACHOL) 20 MG tablet TAKE 1 TABLET(20 MG) BY MOUTH AT BEDTIME 90  tablet 3  . TRESIBA FLEXTOUCH 200 UNIT/ML SOPN Inject 60-70 Units into the skin See admin instructions. Inject 70 units subcutaneously in the morning and 60 units in the evening    . TURMERIC CURCUMIN PO Take 1,000 mg by mouth daily.      No current facility-administered medications for this visit.     Allergies as of 08/23/2019  . (No Known Allergies)    Family History  Problem Relation Age of Onset  . Arthritis Other   . Cancer Other   . Diabetes Other   . CAD Father   . Diabetes Mellitus II Father   . CAD Brother   .  Diabetes Mellitus II Brother   . Anesthesia problems Neg Hx   . Hypotension Neg Hx   . Malignant hyperthermia Neg Hx   . Pseudochol deficiency Neg Hx   . Colon cancer Neg Hx     Social History   Socioeconomic History  . Marital status: Married    Spouse name: Not on file  . Number of children: Not on file  . Years of education: college  . Highest education level: Not on file  Occupational History  . Occupation: Magazine features editor: [employer]  Social Needs  . Financial resource strain: Not on file  . Food insecurity    Worry: Not on file    Inability: Not on file  . Transportation needs    Medical: Not on file    Non-medical: Not on file  Tobacco Use  . Smoking status: Never Smoker  . Smokeless tobacco: Never Used  Substance and Sexual Activity  . Alcohol use: No    Alcohol/week: 0.0 standard drinks  . Drug use: No  . Sexual activity: Yes    Birth control/protection: None  Lifestyle  . Physical activity    Days per week: Not on file    Minutes per session: Not on file  . Stress: Not on file  Relationships  . Social Herbalist on phone: Not on file    Gets together: Not on file    Attends religious service: Not on file    Active member of club or organization: Not on file    Attends meetings of clubs or organizations: Not on file    Relationship status: Not on file  . Intimate partner violence    Fear of  current or ex partner: Not on file    Emotionally abused: Not on file    Physically abused: Not on file    Forced sexual activity: Not on file  Other Topics Concern  . Not on file  Social History Narrative  . Not on file    Review of Systems: Gen: See HPI.  CV: Denies chest pain, heart palpitations Resp: Denies shortness of breath.  Admits to occasional cough.  GI: See HPI GU : Denies urinary burning, urinary frequency, urinary hesitancy MS: Admits to knee pain and shoulder pain.  Needs his right knee replaced.  Is prone to falling. Derm: Denies rash Psych: Denies depression, anxiety Heme: See HPI  Physical Exam: BP 115/69   Pulse 78   Temp (!) 96.9 F (36.1 C) (Temporal)   Ht 5\' 10"  (1.778 m)   Wt 272 lb 12.8 oz (123.7 kg)   BMI 39.14 kg/m  General:   Alert and oriented. Pleasant and cooperative. Well-nourished and well-developed.  Head:  Normocephalic and atraumatic. Eyes:  Without icterus, sclera clear and conjunctiva pink.  Ears:  Normal auditory acuity. Nose:  No deformity, discharge,  or lesions. Lungs:  Clear to auscultation bilaterally. No wheezes, rales, or rhonchi. No distress.  Heart:  S1, S2 present without murmurs appreciated.  Abdomen:  +BS, soft, non-tender and non-distended. No HSM noted. No guarding or rebound. No masses appreciated.  Rectal:  Deferred  Msk:  Symmetrical without gross deformities. Normal posture. Extremities:  1-2+ pitting edema in the left lower leg. 2-3+ pitting edema in the right lower leg that was obviously more swollen than the left. No tenderness to palpation of the right leg. Some redness/diskiness on both lower extremities. Not significantly worse on the right. No increased warmth of the  right leg. No Homan's sign. Patient states he has seen cardiology about this and it improved some, but has worsened some again.  Neurologic:  Alert and  oriented x4;  grossly normal neurologically. Skin:  Intact without significant lesions or rashes.  Psych: Normal mood and affect.

## 2019-08-23 ENCOUNTER — Ambulatory Visit (INDEPENDENT_AMBULATORY_CARE_PROVIDER_SITE_OTHER): Payer: Federal, State, Local not specified - PPO | Admitting: Gastroenterology

## 2019-08-23 ENCOUNTER — Other Ambulatory Visit: Payer: Self-pay

## 2019-08-23 ENCOUNTER — Encounter: Payer: Self-pay | Admitting: Gastroenterology

## 2019-08-23 VITALS — BP 115/69 | HR 78 | Temp 96.9°F | Ht 70.0 in | Wt 272.8 lb

## 2019-08-23 DIAGNOSIS — D696 Thrombocytopenia, unspecified: Secondary | ICD-10-CM | POA: Diagnosis not present

## 2019-08-23 DIAGNOSIS — D509 Iron deficiency anemia, unspecified: Secondary | ICD-10-CM | POA: Diagnosis not present

## 2019-08-23 DIAGNOSIS — R7401 Elevation of levels of liver transaminase levels: Secondary | ICD-10-CM | POA: Insufficient documentation

## 2019-08-23 DIAGNOSIS — R740 Nonspecific elevation of levels of transaminase and lactic acid dehydrogenase [LDH]: Secondary | ICD-10-CM

## 2019-08-23 DIAGNOSIS — R7402 Elevation of levels of lactic acid dehydrogenase (LDH): Secondary | ICD-10-CM | POA: Insufficient documentation

## 2019-08-23 DIAGNOSIS — R197 Diarrhea, unspecified: Secondary | ICD-10-CM | POA: Diagnosis not present

## 2019-08-23 NOTE — Assessment & Plan Note (Addendum)
Isolated mild elevation of AST since 2016. Also with history of anemia and thrombocytopenia since 2016 as described above.  He admits to easy bruising but no other signs or symptoms of advanced liver disease.  AST has been trending down since May 2019.  Most recently in May 2020 AST stable at 42.  Hepatitis A, B, and C negative in January 2020.  Right upper quadrant ultrasound in 2017 with mild increased parenchymal echogenicity of the liver.  No other imaging of the liver has been completed.   Suspect mild elevation could be related to fatty liver as patient does have a history of diabetes, hyperlipidemia, and obesity.  Could also be med effect.  He reports he has started trying to lose weight. Will go ahead and update abdominal ultrasound to ensure no changes of his liver and to take a look at his spleen in light of thrombocytopenia. Advised he continue with his weight loss efforts.

## 2019-08-23 NOTE — Patient Instructions (Addendum)
Please have labs and [place] completed.   I would like to get you scheduled for a colonoscopy and upper endoscopy soon, but first I would like for you to follow-up with cardiology regarding the swelling in your right leg.   We will plan to see you back in 3 months. This may be adjusted depending on cardiology clearance and when can get you scheduled for procedure.   Aliene Altes, PA-C [facility]

## 2019-08-23 NOTE — Assessment & Plan Note (Addendum)
Patient has had thrombocytopenia and anemia since 2016.  Most recently in May 2020 platelets were stable/slightly improved at 72 and hemoglobin improved to 12.5. He has been following with oncology/hematology and their work-up including a bone marrow biopsy in February 2020 was without identified etiology for his thrombocytopenia or anemia.  They felt his pancytopenia was likely autoimmune as he also has history of polyclonal gammopathy.  Recommended he continue to follow with an allergist and see rheumatology. Admits to easy bruising but denies any bleeding. No history of alcohol use or drug use.  No other signs or symptoms of advanced liver disease.  He does have isolated mildly elevated AST since 2016.  Most recently this had improved and is stable at 36 in May 2020.  Right upper quadrant ultrasound in 2017 with mild increased parenchymal echogenicity of the liver.    Etiology of thrombocytopenia is not clear.  Will obtain complete ultrasound of the abdomen to evaluate his spleen and for any changes of his liver. Follow through with oncology/hematology recommendations

## 2019-08-23 NOTE — Assessment & Plan Note (Addendum)
Patient has had anemia since 2016.  Has been following with oncology/hematology.  Recent CBC in May 2020 with hemoglobin improved to 12.5.  Evaluation with oncology/hematology including bone marrow biopsy in February 2020 has been without identified etiology of anemia.  Per oncology note they have felt this could be autoimmune and recommend that he continue to follow with allergist and see rheumatology.  Iron deficiency was identified in January 2020 with ferritin 11, saturation 9%, iron 45, TIBC 490.  FOBT negative in February 2020.  He is taking oral iron at this time.  Denies bright red blood per rectum or melena.  He is without any significant upper GI symptoms.  Has history of reflux but this is well controlled on omeprazole daily.  Denies NSAID use. Lower GI symptom of diarrhea with further evaluation as per above. Last colonoscopy on 05/01/10 with normal rectum and colon. Recommended repeat in 10 years.    With new onset of iron deficiency anemia patient needs endoscopic evaluation with EGD and TCS with Dr. Gala Romney.  Patient is to follow-up with cardiology regarding his asymmetric lower extremity edema that has been present since June prior to scheduling procedures.  He has followed up with cardiology on this once with plans for possible evaluation of DVT.  Addendum: Patient has followed up with cardiology.  D-dimer negative.  Cardiology noted that this is the site of prior vein harvesting.  Recommended continuing low-dose diuretics and add a compression stocking. Proceed with EGD and TCS with propofol with Dr. Gala Romney for further evaluation of iron deficiency anemia. The risks, benefits, and alternatives have been discussed in detail with patient at the time of office visit.  Propofol due to Robaxin, armodafinil, and new addition of Ultracet since office visit.

## 2019-08-24 ENCOUNTER — Telehealth: Payer: Self-pay | Admitting: Gastroenterology

## 2019-08-24 NOTE — Telephone Encounter (Signed)
[name], could you call patient and verify how much oral iron he is taking so I can add this to his medication list? Also, I had ordered a Hep C Ab yesterday, but in reviewing his chart, he had this checked in Jan, so I have canceled this lab.

## 2019-08-24 NOTE — Telephone Encounter (Signed)
Spoke with pt. Pt is aware that the Hep c lab was cancelled due to him having it checked 12/2018. Pt will have his other labs completed. Pt takes iron 500 mg once daily.

## 2019-08-24 NOTE — Assessment & Plan Note (Addendum)
58 y.o. male presenting with diarrhea x 2 months. Stools are typically mushy. Rarely watery.  Often postprandial.  Associated urgency.  No nocturnal stools, brbpr, or melena.  Diarrhea starting to improve at this point.  Having about 2 BMs daily.  Was up to 6 times daily initially. Intermittent lower abdominal pain for a few days initially. This has resolved. Patient reported similar episode in April of this year that lasted about 3-4 weeks. No recent hospitalizations. Was on Levaquin in July, but diarrhea was already present. No sick contacts, well water, or contact with livestock. No medication changes. No other significant lower or upper GI symptoms. Past medical history significant for diabetes, polyclonal gammopathy with elevated IgA currently and elevated IgG in the past which he follows with an allergist for, and newly diagnosed iron deficiency in January 2020. No family history of IBD. Evaluation with PCP included stool studies which were reported negative per patient and PCP note although I do not have the results.   Differentials include celiac disease, lactose intolerance, IBS, possibly post infectious, IBD, and pancreatic insufficiency. Do not suspect infectious process at this point as diarrhea is improving.  Will obtain ttgIgA, TSH, fecal fat, and fecal calprotectin.  Avoid dairy products.  Ultimately patient will need a colonoscopy for further evaluation of iron deficiency anemia which will also assist in evaluation of diarrhea.  We will get this scheduled after he follows up with cardiology for asymmetric swelling in his lower legs which he discussed with them back in June of this year. Plan to follow-up in 3 months. This may be adjusted if he gets cleared by cardiology quickly and we get him scheduled for procedures.   Addendum: Celiac negative, TSH normal, fecal fat normal, fecal calprotectin elevated at 205.  Will proceed with colonoscopy with propofol with Dr. Buford Dresser for further  evaluation.

## 2019-08-27 ENCOUNTER — Other Ambulatory Visit: Payer: Self-pay | Admitting: Orthopedic Surgery

## 2019-08-29 ENCOUNTER — Ambulatory Visit (HOSPITAL_COMMUNITY): Payer: Federal, State, Local not specified - PPO | Attending: Gastroenterology

## 2019-08-29 ENCOUNTER — Telehealth: Payer: Self-pay | Admitting: *Deleted

## 2019-08-29 DIAGNOSIS — R600 Localized edema: Secondary | ICD-10-CM

## 2019-08-29 NOTE — Telephone Encounter (Signed)
Pt notified and voiced understanding 

## 2019-08-29 NOTE — Telephone Encounter (Signed)
-----   Message from Erma Heritage, [place] sent at 08/27/2019 12:51 PM EDT ----- Regarding: RE: Asymmetric LE  edema. Right greater than left. Hi Kisha,   Can you please arrange for this patient to have a D-dimer blood test (association lower extremity edema). Has been occurring for several months by review of prior phone notes and again noted during his recent GI visit.  Thanks,  [place]    ----- Message ----- From: Isaiah Serge, NP Sent: 08/24/2019   4:22 PM EDT To: Erma Heritage, PA-C Subject: FW: Asymmetric LE  edema. Right greater than#  [place] I will leave this to you it is Friday at 4:22 and I have 7 charts to finish.  Thanks.  Mickel Baas ----- Message ----- From: Roselyn Reef Sent: 08/24/2019  12:33 PM EDT To: Isaiah Serge, NP Subject: RE: Asymmetric LE  edema. Right greater than#  This is fine for [place] regarding getting him scheduled for procedures. He may need to be triaged over the phone with you all to ensure he doesn't need additional workup prior to that appointment.   ----- Message ----- From: Isaiah Serge, NP Sent: 08/24/2019  12:19 PM EDT To: Erenest Rasher, PA-C Subject: RE: Asymmetric LE  edema. Right greater than#  He has appt with Dr. Domenic Polite 9/24 is that ok?   ----- Message ----- From: Erenest Rasher, PA-C Sent: 08/23/2019  12:46 PM EDT To: Erma Heritage, PA-C Subject: Asymmetric LE  edema. Right greater than lef#  [place],   Needing to schedule this patient for upper and lower endoscopy for IDA. He has asymmetric LE pitting edema with right greater than left. I saw he had called your office with this in June. He was to increase his Lasix for a few days and if no improvement, would consider DVT evaluation. He states he did have some improvement, but it is worsening again. No pain or increased warmth of right leg. I have advised he call your office for further evaluation. Hoping he can get in to see you soon so we can get him  scheduled for his procedures.   Thanks,   Aliene Altes, PA-C

## 2019-08-30 ENCOUNTER — Other Ambulatory Visit (HOSPITAL_COMMUNITY)
Admission: RE | Admit: 2019-08-30 | Discharge: 2019-08-30 | Disposition: A | Discharge status: Home / Self Care | Payer: Federal, State, Local not specified - PPO | Source: Ambulatory Visit | Attending: Student | Admitting: Student

## 2019-08-30 DIAGNOSIS — R600 Localized edema: Secondary | ICD-10-CM | POA: Diagnosis not present

## 2019-08-30 LAB — D-DIMER, QUANTITATIVE: D-Dimer, Quant: 0.41 ug/mL-FEU (ref 0.00–0.50)

## 2019-08-31 DIAGNOSIS — R197 Diarrhea, unspecified: Secondary | ICD-10-CM | POA: Diagnosis not present

## 2019-09-05 ENCOUNTER — Encounter: Payer: Self-pay | Admitting: Cardiology

## 2019-09-05 ENCOUNTER — Telehealth (INDEPENDENT_AMBULATORY_CARE_PROVIDER_SITE_OTHER): Payer: Federal, State, Local not specified - PPO | Admitting: Cardiology

## 2019-09-05 ENCOUNTER — Other Ambulatory Visit: Payer: Self-pay

## 2019-09-05 VITALS — BP 128/72 | HR 84 | Ht 70.0 in | Wt 274.0 lb

## 2019-09-05 DIAGNOSIS — E782 Mixed hyperlipidemia: Secondary | ICD-10-CM | POA: Diagnosis not present

## 2019-09-05 DIAGNOSIS — I10 Essential (primary) hypertension: Secondary | ICD-10-CM

## 2019-09-05 DIAGNOSIS — M7989 Other specified soft tissue disorders: Secondary | ICD-10-CM | POA: Diagnosis not present

## 2019-09-05 DIAGNOSIS — I25119 Atherosclerotic heart disease of native coronary artery with unspecified angina pectoris: Secondary | ICD-10-CM

## 2019-09-05 DIAGNOSIS — I6523 Occlusion and stenosis of bilateral carotid arteries: Secondary | ICD-10-CM | POA: Diagnosis not present

## 2019-09-05 NOTE — Patient Instructions (Addendum)
Medication Instructions:  Continue all current medications.  Labwork: none  Testing/Procedures: none  Follow-Up: Keep your already scheduled visit as planned for 11/26/2019 with Dr. Domenic Polite in the [place] office.    Any Other Special Instructions Will Be Listed Below (If Applicable).  Order enclosed for below the knee, low compression stockings.  You make take this to places like [facility] or [facility] pharmacies that sell durable medical equipment will probably be the best.   If you need a refill on your cardiac medications before your next appointment, please call your pharmacy.

## 2019-09-05 NOTE — Progress Notes (Signed)
Virtual Visit via Telephone Note   This visit type was conducted due to national recommendations for restrictions regarding the COVID-19 Pandemic (e.g. social distancing) in an effort to limit this patient's exposure and mitigate transmission in our community.  Due to his co-morbid illnesses, this patient is at least at moderate risk for complications without adequate follow up.  This format is felt to be most appropriate for this patient at this time.  The patient did not have access to video technology/had technical difficulties with video requiring transitioning to audio format only (telephone).  All issues noted in this document were discussed and addressed.  No physical exam could be performed with this format.  Please refer to the patient's chart for his  consent to telehealth for [facility].   Date:  09/05/2019   ID:  [name], DOB 12-Dec-1961, MRN [id number]  Patient Location: Home Provider Location: Office  PCP:  [name], MD  Cardiologist:  Rozann Lesches, MD Electrophysiologist:  None   Evaluation Performed:  Follow-Up Visit  Chief Complaint:   Cardiac follow-up  History of Present Illness:    [name] is a 58 y.o. male last seen in June.  We had technical difficulties with video conferencing today and spoke by phone.  He does not report any active angina symptoms at this time.  He has had intermittent trouble with right leg swelling, remains on low-dose diuretics.  This is the leg that he had vein harvesting with CABG in the past.  Swelling is usually better in the morning after his legs have been up.  Recent lab work is outlined below, LDL 55.  He was taken off Zetia and continues on statin therapy.  Carotid Dopplers from June revealed bilateral 50 to 69% ICA stenoses.  He has been asymptomatic.  He continues on aspirin and statin therapy.  He tells me that he is undergoing GI evaluation, knee surgery is still on hold.  The patient does not  have symptoms concerning for COVID-19 infection (fever, chills, cough, or new shortness of breath).    Past Medical History:  Diagnosis Date  . Anemia 2016   iron deficiency noted in 2018.   . Arthritis   . CAD (coronary artery disease)    Multivessel disease status post CABG 08/2015  . CKD (chronic kidney disease), stage II   . Essential hypertension   . Hyperlipidemia   . Hypertension   . OSA on CPAP   . Thrombocytopenia ([place]) 2016  . Type 2 diabetes mellitus ([place])    Past Surgical History:  Procedure Laterality Date  . APPENDECTOMY    . Biceps tendon surgery Right   . CARDIAC CATHETERIZATION N/A 08/25/2015   Procedure: Left Heart Cath and Coronary Angiography;  Surgeon: Belva Crome, MD;  Location: [facility];  Service: Cardiovascular;  Laterality: N/A;  . CORONARY ARTERY BYPASS GRAFT N/A 08/29/2015   Procedure: CORONARY ARTERY BYPASS GRAFTING (CABG);  Surgeon: [name], MD;  Location: [facility];  Service: Open Heart Surgery;  Laterality: N/A;  . KNEE ARTHROSCOPY Left   . TEE WITHOUT CARDIOVERSION N/A 08/29/2015   Procedure: TRANSESOPHAGEAL ECHOCARDIOGRAM (TEE);  Surgeon: [name], MD;  Location: [facility];  Service: Open Heart Surgery;  Laterality: N/A;  . TOTAL KNEE ARTHROPLASTY Left 03/09/2017   Procedure: TOTAL KNEE ARTHROPLASTY;  Surgeon: Carole Civil, MD;  Location: [facility];  Service: Orthopedics;  Laterality: Left;     Current Meds  Medication Sig  .  albuterol (VENTOLIN HFA) 108 (90 Base) MCG/ACT inhaler Inhale 2 puffs into the lungs every 6 (six) hours as needed for wheezing or shortness of breath.  . Armodafinil 200 MG TABS Use as needed for sleepiness, by mouth. 1 tab  before 12 noon.  [name] aspirin EC 81 MG tablet Take 81 mg by mouth at bedtime.   [name] (EUCRISA) 2 % OINT Apply 1 application topically 2 (two) times daily as needed.  . diphenhydrAMINE (BENADRYL) 50 MG tablet Take 1 tablet (50 mg total) by mouth at bedtime as needed for  itching.  [name] FERROUS SULFATE PO Take 500 mg by mouth.   . Flaxseed, Linseed, (FLAXSEED OIL PO) Take 500 mg by mouth daily.  . furosemide (LASIX) 20 MG tablet TAKE 1 TABLET BY MOUTH DAILY  . hydrochlorothiazide (HYDRODIURIL) 25 MG tablet Take 12.5 mg by mouth daily.   . insulin aspart (NOVOLOG) 100 UNIT/ML injection Inject 0.4-35 Units into the skin 3 (three) times daily before meals.   [name] KOMBIGLYZE XR 2.04-999 MG TB24 Take 1 tablet by mouth 2 (two) times daily.   [name] loratadine (CLARITIN) 10 MG tablet Take 10 mg by mouth daily.  . methocarbamol (ROBAXIN) 500 MG tablet TAKE 1 TABLET(500 MG) BY MOUTH THREE TIMES DAILY  . metoprolol tartrate (LOPRESSOR) 25 MG tablet TAKE 1/2 TABLET(12.5 MG) BY MOUTH TWICE DAILY  . montelukast (SINGULAIR) 10 MG tablet Take 1 tablet (10 mg total) by mouth at bedtime.  . Multiple Vitamins-Minerals (MULTIVITAMIN ADULT PO) Take 1 tablet by mouth daily.  [name] olmesartan (BENICAR) 40 MG tablet TK 1 T PO QD  . omeprazole (PRILOSEC) 40 MG capsule Take 40 mg by mouth at bedtime.  . pravastatin (PRAVACHOL) 20 MG tablet TAKE 1 TABLET(20 MG) BY MOUTH AT BEDTIME  . traMADol-acetaminophen (ULTRACET) 37.5-325 MG tablet TAKE 1 TABLET BY MOUTH EVERY 4 HOURS FOR UP TO 7 DAYS AS NEEDED  . TRESIBA FLEXTOUCH 200 UNIT/ML SOPN Inject 40-70 Units into the skin See admin instructions. Inject 70 units subcutaneously in the morning and 40 units in the evening  . TURMERIC CURCUMIN PO Take 1,000 mg by mouth daily.      Allergies:   Patient has no known allergies.   Social History   Tobacco Use  . Smoking status: Never Smoker  . Smokeless tobacco: Never Used  Substance Use Topics  . Alcohol use: No    Alcohol/week: 0.0 standard drinks  . Drug use: No     Family Hx: The patient's family history includes Arthritis in an other family member; CAD in his brother and father; Cancer in an other family member; Diabetes in an other family member; Diabetes Mellitus II in his brother and father.  There is no history of Anesthesia problems, Hypotension, Malignant hyperthermia, Pseudochol deficiency, or Colon cancer.  ROS:   Please see the history of present illness. All other systems reviewed and are negative.   Prior CV studies:   The following studies were reviewed today:  Echocardiogram 05/10/2018: Study Conclusions  - Left ventricle: The cavity size was normal. Wall thickness was increased in a pattern of mild LVH. Systolic function was normal. The estimated ejection fraction was in the range of 55% to 60%. Wall motion was normal; there were no regional wall motion abnormalities. Left ventricular diastolic function parameters were normal for the patient&'s age. - Aortic valve: Moderately calcified annulus. Trileaflet; mildly calcified leaflets. There was mild stenosis. Mean gradient (S): 10 mm Hg. Peak gradient (S): 18 mm Hg. Valve area (  VTI): 1.68 cm^2. - Mitral valve: Mildly calcified annulus. There was trivial regurgitation. - Right ventricle: Systolic function was low normal. - Right atrium: Central venous pressure (est): 3 mm Hg. - Atrial septum: No defect or patent foramen ovale was identified. - Tricuspid valve: There was trivial regurgitation. - Pulmonary arteries: PA peak pressure: 9 mm Hg (S). - Pericardium, extracardiac: There was no pericardial effusion.  Carotid Dopplers 05/28/2019: IMPRESSION: Minimal amount of atherosclerotic plaque results in elevated peak systolic velocities within the bilateral internal carotid arteries compatible with the 50-69% luminal narrowing range bilaterally. Further evaluation with CTA could performed as clinically indicated.  Labs/Other Tests and Data Reviewed:    EKG:  An ECG dated 05/04/2018 was personally reviewed today and demonstrated:  Sinus rhythm with poor R wave progression, rule out old anterior infarct pattern, low voltage.  Recent Labs: 05/03/2019: ALT 19; BUN 22; Creatinine 1.48; Hemoglobin  12.5; Platelet Count 72; Potassium 4.2; Sodium 137 08/31/2019: TSH 2.400   Recent Lipid Panel Lab Results  Component Value Date/Time   CHOL 120 05/25/2019 10:01 AM   TRIG 136 05/25/2019 10:01 AM   HDL 38 (L) 05/25/2019 10:01 AM   CHOLHDL 3.2 05/25/2019 10:01 AM   LDLCALC 55 05/25/2019 10:01 AM    Wt Readings from Last 3 Encounters:  09/05/19 274 lb (124.3 kg)  08/23/19 272 lb 12.8 oz (123.7 kg)  06/19/19 267 lb (121.1 kg)     Objective:    Vital Signs:  BP 128/72   Pulse 84   Ht 5\' 10"  (1.778 m)   Wt 274 lb (124.3 kg)   BMI 39.31 kg/m    Patient spoke in full sentences, not short of breath. No audible wheezing or coughing. Speech pattern normal.  ASSESSMENT & PLAN:    1.  Multivessel CAD status post CABG in 2016.  He does not report any active angina at this time.  Continue aspirin and statin.  2.  Right leg swelling, dependent, generally better in the mornings.  Continue low-dose diuretics, would not advance too much in light of his renal insufficiency.  Prescription provided for right leg below the knee compression stocking.  This is the site of prior vein harvesting.  3.  Mixed hyperlipidemia.  He is on Pravachol with most recent LDL 55.  4.  Moderate bilateral ICA stenoses, asymptomatic.  Continue aspirin and statin with follow-up carotid Dopplers next year.  COVID-19 Education: The signs and symptoms of COVID-19 were discussed with the patient and how to seek care for testing (follow up with PCP or arrange E-visit).  The importance of social distancing was discussed today.  Time:   Today, I have spent 7 minutes with the patient with telehealth technology discussing the above problems.     Medication Adjustments/Labs and Tests Ordered: Current medicines are reviewed at length with the patient today.  Concerns regarding medicines are outlined above.   Tests Ordered: No orders of the defined types were placed in this encounter.   Medication Changes: No orders  of the defined types were placed in this encounter.   Follow Up:  In Person 6 months in the [place] office.  Signed, Rozann Lesches, MD  09/05/2019 1:20 PM    [place]

## 2019-09-06 ENCOUNTER — Telehealth: Payer: Federal, State, Local not specified - PPO | Admitting: Cardiology

## 2019-09-07 ENCOUNTER — Telehealth: Payer: Self-pay | Admitting: Gastroenterology

## 2019-09-07 ENCOUNTER — Telehealth: Payer: Self-pay | Admitting: Cardiology

## 2019-09-07 ENCOUNTER — Other Ambulatory Visit: Payer: Self-pay | Admitting: *Deleted

## 2019-09-07 DIAGNOSIS — D509 Iron deficiency anemia, unspecified: Secondary | ICD-10-CM

## 2019-09-07 DIAGNOSIS — R197 Diarrhea, unspecified: Secondary | ICD-10-CM

## 2019-09-07 LAB — CALPROTECTIN, FECAL: Calprotectin, Fecal: 205 ug/g — ABNORMAL HIGH (ref 0–120)

## 2019-09-07 LAB — FECAL FAT, QUALITATIVE
Fat Qual Neutral, Stl: NORMAL
Fat Qual Total, Stl: NORMAL

## 2019-09-07 LAB — TISSUE TRANSGLUTAMINASE, IGA: Transglutaminase IgA: 3 U/mL (ref 0–3)

## 2019-09-07 LAB — HEPATITIS C ANTIBODY: Hep C Virus Ab: <0.1 s/co ratio (ref 0.0–0.9)

## 2019-09-07 LAB — TSH: TSH: 2.4 u[IU]/mL (ref 0.450–4.500)

## 2019-09-07 MED ORDER — PEG 3350-KCL-NA BICARB-NACL 420 G PO SOLR: 4000.0000 mL | Freq: Once | ORAL | 0 refills | Status: AC

## 2019-09-07 NOTE — Telephone Encounter (Signed)
Virtual apt Wednesday afternoon, suspect rx was mailed thurs am, pt will call back Monday afternoon if he hasn't received yet

## 2019-09-07 NOTE — Telephone Encounter (Signed)
Pt would like to know where the Rx for his compression stockings is?  Had virtual visit w/ Dr. Domenic Polite on 09/05/2019  Please call pt @ [phone]

## 2019-09-07 NOTE — Telephone Encounter (Signed)
[name], can you let patient know his other lab results and my recommendations?  Fecal fat is normal. Fecal Calprotectin is elevated. This is usually elevated if there is an inflammation within the colon. Non-diagnostic at this point, but can be seen in IBD.   I have reviewed his recent cardiology note and see that he had a d-dimer to evaluate for possible DVT in his right leg that was negative.  Cardiology recommended continuing diuretics and using a compression stocking.  Of note this was the site of prior vein harvesting which is likely contributing to his asymmetrical lower extremity swelling.  As he has completed cardiac evaluation and they are not pursuing any further evaluation, we should go ahead and get patient scheduled for colonoscopy and upper endoscopy with propofol in the near future with Dr. Gala Romney. Dx: IDA, diarrhea, elevated fecal calprotectin.   [facility] Clinical Pool, can we get this scheduled?  Medication adjustments prior to procedures:  Hold iron x7 days. 1 day before the procedure:  Continue sliding scale insulin as needed   Take 1/2 tablet Kombiglyze XR in the morning and evening  Hold diabetes medications the morning of the procedure  [name]: We can adjust follow-up date if he is scheduled to be seen in the office before his procedures would be.  I would like to see him back in the office after procedures.

## 2019-09-10 ENCOUNTER — Encounter: Payer: Self-pay | Admitting: *Deleted

## 2019-09-10 MED ORDER — PEG 3350-KCL-NA BICARB-NACL 420 G PO SOLR: 4000.0000 mL | Freq: Once | ORAL | 0 refills | Status: AC

## 2019-09-10 NOTE — Telephone Encounter (Signed)
Called patient and he is scheduled for TCS/EGD 12/10 at 7:30am. Patient aware will mail prep instructions with his pre-op/covid-19 testing appt. Confirmed mailing address. Rx sent to pharmacy

## 2019-09-10 NOTE — Telephone Encounter (Signed)
CANCELLED HIS APPOINTMENT AND ADDED HIM TO RECALL FOR [place]

## 2019-09-10 NOTE — Addendum Note (Signed)
Addended by: Cheron Every on: 09/10/2019 08:31 AM   Modules accepted: Orders

## 2019-09-10 NOTE — Telephone Encounter (Signed)
Spoke with pt. He is aware of his results and states he received a mychart message as well.

## 2019-09-20 DIAGNOSIS — Z6839 Body mass index (BMI) 39.0-39.9, adult: Secondary | ICD-10-CM | POA: Diagnosis not present

## 2019-09-20 DIAGNOSIS — R05 Cough: Secondary | ICD-10-CM | POA: Diagnosis not present

## 2019-10-01 ENCOUNTER — Other Ambulatory Visit: Payer: Self-pay | Admitting: Orthopedic Surgery

## 2019-10-01 ENCOUNTER — Telehealth: Payer: Self-pay | Admitting: Cardiology

## 2019-10-01 MED ORDER — TRAMADOL-ACETAMINOPHEN 37.5-325 MG PO TABS: 1.0000 | ORAL_TABLET | ORAL | 5 refills | Status: DC | PRN

## 2019-10-01 NOTE — Telephone Encounter (Signed)
Pt states he has not received his Rx yet for his compression stockings

## 2019-10-02 NOTE — Telephone Encounter (Signed)
I asked him to come by office and will will have another order form for him

## 2019-10-09 ENCOUNTER — Telehealth: Payer: Self-pay | Admitting: Internal Medicine

## 2019-10-09 NOTE — Telephone Encounter (Signed)
[phone] PATIENT USES [facility] IN [place] AND WANTS TO KNOW IF WE CAN SEND HIM IN A PRESCRIPTION FOR DIARRHEA

## 2019-10-09 NOTE — Telephone Encounter (Signed)
Pt returned call. Pt was seen 08/2019. Pt is still having watery or loose diarrhea. Pt states the stool can be black and he is aware that he takes iron sup, Pepto Bismol prn and Imodium. Pt's diarrhea comes on with or without eating. Pt isn't currently having any abdominal pain but does get some pain from time to time when he needs to have a bowel movement. Pt's pain isn't constant. Pt would like a prescription medication sent to his pharmacy to help with his diarrhea.

## 2019-10-09 NOTE — Telephone Encounter (Signed)
Lmom, waiting on a return call.  

## 2019-10-09 NOTE — Telephone Encounter (Signed)
Pt was returning a call to AM. [phone]

## 2019-10-10 ENCOUNTER — Other Ambulatory Visit: Payer: Self-pay | Admitting: Gastroenterology

## 2019-10-10 DIAGNOSIS — Z6839 Body mass index (BMI) 39.0-39.9, adult: Secondary | ICD-10-CM | POA: Diagnosis not present

## 2019-10-10 DIAGNOSIS — R197 Diarrhea, unspecified: Secondary | ICD-10-CM

## 2019-10-10 DIAGNOSIS — R05 Cough: Secondary | ICD-10-CM | POA: Diagnosis not present

## 2019-10-10 MED ORDER — DICYCLOMINE HCL 10 MG PO CAPS: 10.0000 mg | ORAL_CAPSULE | Freq: Three times a day (TID) | ORAL | 2 refills | Status: DC

## 2019-10-10 NOTE — Telephone Encounter (Signed)
Noted. Left a detailed message for pt.pt notified that medication was called in and can cause dry mouth, dizziness and constipation.

## 2019-10-10 NOTE — Progress Notes (Signed)
Bentyl sent to pharmacy for diarrhea.

## 2019-10-10 NOTE — Telephone Encounter (Signed)
Sending in Bentyl 10 mg for patient to try. He can take this up to 3 times daily for diarrhea and abdominal cramping. This can cause dry mouth, dizziness and constipation.

## 2019-10-31 DIAGNOSIS — E1165 Type 2 diabetes mellitus with hyperglycemia: Secondary | ICD-10-CM | POA: Diagnosis not present

## 2019-10-31 DIAGNOSIS — I10 Essential (primary) hypertension: Secondary | ICD-10-CM | POA: Diagnosis not present

## 2019-10-31 DIAGNOSIS — N189 Chronic kidney disease, unspecified: Secondary | ICD-10-CM | POA: Diagnosis not present

## 2019-10-31 DIAGNOSIS — E7800 Pure hypercholesterolemia, unspecified: Secondary | ICD-10-CM | POA: Diagnosis not present

## 2019-11-13 ENCOUNTER — Telehealth: Payer: Self-pay | Admitting: Internal Medicine

## 2019-11-13 NOTE — Telephone Encounter (Signed)
I am glad his diarrhea has improved! He does still need TCS and EGD for IDA.

## 2019-11-13 NOTE — Telephone Encounter (Signed)
Pt is scheduled tcs/egd with [facility] on 12/10 and has questions about medication and side effects and whether or not he still needs to have procedure. I told him that I would have the nurse call him. [phone]

## 2019-11-13 NOTE — Telephone Encounter (Signed)
Lmom, waiting on a return call.  

## 2019-11-13 NOTE — Telephone Encounter (Signed)
Noted. Pt is aware that he will need to have the TCS and EGD for IDA.

## 2019-11-13 NOTE — Telephone Encounter (Signed)
Pt returned call. Can say his doctor and pt told his doctor about diarrhea he was having. Pt was asked to d/c Kombiglyze XR 2.04-999 mg. Diarrhea is a side affect when taking Kombiglyze XR. Pt has been off of the medication for 13 days and after stopping it, he's only had diarrhea once. Pt states his doctor said if the diarrhea continued, they could give him a medication to stop it.  Pt would like to know if he still needs to have the TCS/EGD? Pt is ok with having it if needed.

## 2019-11-15 NOTE — Patient Instructions (Signed)
Your procedure is scheduled on: 11/22/2019  Report to [name] at    6:15 AM.  Call this number if you have problems the morning of surgery: [phone]   Remember:              Follow Directions on the letter you received from Your Physician's office regarding the Bowel Prep  :  Take these medicines the morning of surgery with A SIP OF WATER: Metoprolol, Benicar, omeprazole, claritin, tramadol and Albuterol inhaler if needed   Take only 1/2 dose of Tresiba 20 units night before surgery none am of procedure   Do not wear jewelry, make-up or nail polish.    Do not bring valuables to the hospital.  Contacts, dentures or bridgework may not be worn into surgery.  .   Patients discharged the day of surgery will not be allowed to drive home.     Colonoscopy, Adult, Care After This sheet gives you information about how to care for yourself after your procedure. Your health care provider may also give you more specific instructions. If you have problems or questions, contact your health care provider. What can I expect after the procedure? After the procedure, it is common to have:  A small amount of blood in your stool for 24 hours after the procedure.  Some gas.  Mild abdominal cramping or bloating.  Follow these instructions at home: General instructions   For the first 24 hours after the procedure: ? Do not drive or use machinery. ? Do not sign important documents. ? Do not drink alcohol. ? Do your regular daily activities at a slower pace than normal. ? Eat soft, easy-to-digest foods. ? Rest often.  Take over-the-counter or prescription medicines only as told by your health care provider.  It is up to you to get the results of your procedure. Ask your health care provider, or the department performing the procedure, when your results will be ready. Relieving cramping and bloating  Try walking around when you have cramps or feel bloated.  Apply heat to your  abdomen as told by your health care provider. Use a heat source that your health care provider recommends, such as a moist heat pack or a heating pad. ? Place a towel between your skin and the heat source. ? Leave the heat on for 20-30 minutes. ? Remove the heat if your skin turns bright red. This is especially important if you are unable to feel pain, heat, or cold. You may have a greater risk of getting burned. Eating and drinking  Drink enough fluid to keep your urine clear or pale yellow.  Resume your normal diet as instructed by your health care provider. Avoid heavy or fried foods that are hard to digest.  Avoid drinking alcohol for as long as instructed by your health care provider. Contact a health care provider if:  You have blood in your stool 2-3 days after the procedure. Get help right away if:  You have more than a small spotting of blood in your stool.  You pass large blood clots in your stool.  Your abdomen is swollen.  You have nausea or vomiting.  You have a fever.  You have increasing abdominal pain that is not relieved with medicine. This information is not intended to replace advice given to you by your health care provider. Make sure you discuss any questions you have with your health care provider. Document Released: 07/13/2004 Document Revised: 08/23/2016 Document Reviewed: 02/10/2016 Elsevier Interactive  Patient Education  2018 [place] [facility], Adult, Care After This sheet gives you information about how to care for yourself after your procedure. Your health care provider may also give you more specific instructions. If you have problems or questions, contact your health care provider. What can I expect after the procedure? After the procedure, it is common to have:  A sore throat.  Mild stomach pain or discomfort.  Bloating.  Nausea. Follow these instructions at home:   Follow instructions from your health care provider about what  to eat or drink after your procedure.  Return to your normal activities as told by your health care provider. Ask your health care provider what activities are safe for you.  Take over-the-counter and prescription medicines only as told by your health care provider.  Do not drive for 24 hours if you were given a sedative during your procedure.  Keep all follow-up visits as told by your health care provider. This is important. Contact a health care provider if you have:  A sore throat that lasts longer than one day.  Trouble swallowing. Get help right away if:  You vomit blood or your vomit looks like coffee grounds.  You have: ? A fever. ? Bloody, black, or tarry stools. ? A severe sore throat or you cannot swallow. ? Difficulty breathing. ? Severe pain in your chest or abdomen. Summary  After the procedure, it is common to have a sore throat, mild stomach discomfort, bloating, and nausea.  Do not drive for 24 hours if you were given a sedative during the procedure.  Follow instructions from your health care provider about what to eat or drink after your procedure.  Return to your normal activities as told by your health care provider. This information is not intended to replace advice given to you by your health care provider. Make sure you discuss any questions you have with your health care provider. Document Released: 05/30/2012 Document Revised: 05/23/2018 Document Reviewed: 05/01/2018 Elsevier Patient Education  2020 [employer].

## 2019-11-20 ENCOUNTER — Encounter (HOSPITAL_COMMUNITY)
Admission: RE | Admit: 2019-11-20 | Discharge: 2019-11-20 | Disposition: A | Discharge status: Home / Self Care | Payer: Federal, State, Local not specified - PPO | Source: Ambulatory Visit | Attending: Internal Medicine | Admitting: Internal Medicine

## 2019-11-20 ENCOUNTER — Other Ambulatory Visit: Payer: Self-pay

## 2019-11-20 ENCOUNTER — Encounter (HOSPITAL_COMMUNITY): Payer: Self-pay

## 2019-11-20 ENCOUNTER — Other Ambulatory Visit (HOSPITAL_COMMUNITY)
Admission: RE | Admit: 2019-11-20 | Discharge: 2019-11-20 | Disposition: A | Discharge status: Home / Self Care | Payer: Federal, State, Local not specified - PPO | Source: Ambulatory Visit | Attending: Internal Medicine | Admitting: Internal Medicine

## 2019-11-20 DIAGNOSIS — Z01812 Encounter for preprocedural laboratory examination: Secondary | ICD-10-CM | POA: Insufficient documentation

## 2019-11-20 LAB — BASIC METABOLIC PANEL
Anion gap: 11 (ref 5–15)
BUN: 27 mg/dL — ABNORMAL HIGH (ref 6–20)
CO2: 24 mmol/L (ref 22–32)
Calcium: 9.2 mg/dL (ref 8.9–10.3)
Chloride: 100 mmol/L (ref 98–111)
Creatinine, Ser: 1.52 mg/dL — ABNORMAL HIGH (ref 0.61–1.24)
GFR calc Af Amer: 57 mL/min — ABNORMAL LOW (ref >60)
GFR calc non Af Amer: 49 mL/min — ABNORMAL LOW (ref >60)
Glucose, Bld: 266 mg/dL — ABNORMAL HIGH (ref 70–99)
Potassium: 3.8 mmol/L (ref 3.5–5.1)
Sodium: 135 mmol/L (ref 135–145)

## 2019-11-20 LAB — SARS CORONAVIRUS 2 (TAT 6-24 HRS): SARS Coronavirus 2: NEGATIVE

## 2019-11-22 ENCOUNTER — Other Ambulatory Visit: Payer: Self-pay

## 2019-11-22 ENCOUNTER — Ambulatory Visit (HOSPITAL_COMMUNITY)
Admission: RE | Admit: 2019-11-22 | Discharge: 2019-11-22 | Disposition: A | Discharge status: Home / Self Care | Payer: Federal, State, Local not specified - PPO | Attending: Internal Medicine | Admitting: Internal Medicine

## 2019-11-22 ENCOUNTER — Ambulatory Visit: Payer: Federal, State, Local not specified - PPO | Admitting: Gastroenterology

## 2019-11-22 ENCOUNTER — Ambulatory Visit (HOSPITAL_COMMUNITY): Payer: Federal, State, Local not specified - PPO | Admitting: Anesthesiology

## 2019-11-22 ENCOUNTER — Encounter (HOSPITAL_COMMUNITY)
Admission: RE | Disposition: A | Discharge status: Home / Self Care | Payer: Self-pay | Source: Home / Self Care | Attending: Internal Medicine

## 2019-11-22 ENCOUNTER — Encounter (HOSPITAL_COMMUNITY): Payer: Self-pay | Admitting: Internal Medicine

## 2019-11-22 DIAGNOSIS — N182 Chronic kidney disease, stage 2 (mild): Secondary | ICD-10-CM | POA: Diagnosis not present

## 2019-11-22 DIAGNOSIS — K6289 Other specified diseases of anus and rectum: Secondary | ICD-10-CM | POA: Insufficient documentation

## 2019-11-22 DIAGNOSIS — E119 Type 2 diabetes mellitus without complications: Secondary | ICD-10-CM | POA: Diagnosis not present

## 2019-11-22 DIAGNOSIS — K317 Polyp of stomach and duodenum: Secondary | ICD-10-CM | POA: Insufficient documentation

## 2019-11-22 DIAGNOSIS — R197 Diarrhea, unspecified: Secondary | ICD-10-CM

## 2019-11-22 DIAGNOSIS — I2510 Atherosclerotic heart disease of native coronary artery without angina pectoris: Secondary | ICD-10-CM | POA: Diagnosis not present

## 2019-11-22 DIAGNOSIS — D649 Anemia, unspecified: Secondary | ICD-10-CM | POA: Diagnosis present

## 2019-11-22 DIAGNOSIS — K2950 Unspecified chronic gastritis without bleeding: Secondary | ICD-10-CM | POA: Diagnosis not present

## 2019-11-22 DIAGNOSIS — E785 Hyperlipidemia, unspecified: Secondary | ICD-10-CM | POA: Insufficient documentation

## 2019-11-22 DIAGNOSIS — I8500 Esophageal varices without bleeding: Secondary | ICD-10-CM | POA: Insufficient documentation

## 2019-11-22 DIAGNOSIS — Z794 Long term (current) use of insulin: Secondary | ICD-10-CM | POA: Insufficient documentation

## 2019-11-22 DIAGNOSIS — K529 Noninfective gastroenteritis and colitis, unspecified: Secondary | ICD-10-CM | POA: Diagnosis not present

## 2019-11-22 DIAGNOSIS — K3189 Other diseases of stomach and duodenum: Secondary | ICD-10-CM | POA: Diagnosis not present

## 2019-11-22 DIAGNOSIS — Z951 Presence of aortocoronary bypass graft: Secondary | ICD-10-CM | POA: Insufficient documentation

## 2019-11-22 DIAGNOSIS — Z79899 Other long term (current) drug therapy: Secondary | ICD-10-CM | POA: Insufficient documentation

## 2019-11-22 DIAGNOSIS — Z7982 Long term (current) use of aspirin: Secondary | ICD-10-CM | POA: Insufficient documentation

## 2019-11-22 DIAGNOSIS — I129 Hypertensive chronic kidney disease with stage 1 through stage 4 chronic kidney disease, or unspecified chronic kidney disease: Secondary | ICD-10-CM | POA: Insufficient documentation

## 2019-11-22 DIAGNOSIS — I8510 Secondary esophageal varices without bleeding: Secondary | ICD-10-CM

## 2019-11-22 DIAGNOSIS — K635 Polyp of colon: Secondary | ICD-10-CM

## 2019-11-22 DIAGNOSIS — G4733 Obstructive sleep apnea (adult) (pediatric): Secondary | ICD-10-CM | POA: Insufficient documentation

## 2019-11-22 DIAGNOSIS — I10 Essential (primary) hypertension: Secondary | ICD-10-CM | POA: Diagnosis not present

## 2019-11-22 DIAGNOSIS — D123 Benign neoplasm of transverse colon: Secondary | ICD-10-CM | POA: Insufficient documentation

## 2019-11-22 DIAGNOSIS — I252 Old myocardial infarction: Secondary | ICD-10-CM | POA: Insufficient documentation

## 2019-11-22 DIAGNOSIS — Z96652 Presence of left artificial knee joint: Secondary | ICD-10-CM | POA: Insufficient documentation

## 2019-11-22 DIAGNOSIS — E1122 Type 2 diabetes mellitus with diabetic chronic kidney disease: Secondary | ICD-10-CM | POA: Insufficient documentation

## 2019-11-22 DIAGNOSIS — D509 Iron deficiency anemia, unspecified: Secondary | ICD-10-CM | POA: Diagnosis not present

## 2019-11-22 HISTORY — PX: ESOPHAGOGASTRODUODENOSCOPY (EGD) WITH PROPOFOL: SHX5813

## 2019-11-22 HISTORY — PX: COLONOSCOPY WITH PROPOFOL: SHX5780

## 2019-11-22 HISTORY — PX: BIOPSY: SHX5522

## 2019-11-22 LAB — GLUCOSE, CAPILLARY: Glucose-Capillary: 181 mg/dL — ABNORMAL HIGH (ref 70–99)

## 2019-11-22 SURGERY — COLONOSCOPY WITH PROPOFOL
Anesthesia: General

## 2019-11-22 MED ORDER — LIDOCAINE HCL (CARDIAC) PF 50 MG/5ML IV SOSY
PREFILLED_SYRINGE | INTRAVENOUS | Status: DC | PRN
  Administered 2019-11-22: 60 mg via INTRAVENOUS

## 2019-11-22 MED ORDER — PHENYLEPHRINE HCL (PRESSORS) 10 MG/ML IV SOLN
INTRAVENOUS | Status: DC | PRN
  Administered 2019-11-22 (×2): 80 ug via INTRAVENOUS

## 2019-11-22 MED ORDER — LACTATED RINGERS IV SOLN
INTRAVENOUS | Status: DC | PRN
  Administered 2019-11-22: 07:00:00 via INTRAVENOUS

## 2019-11-22 MED ORDER — PROPOFOL 500 MG/50ML IV EMUL
INTRAVENOUS | Status: DC | PRN
  Administered 2019-11-22: 65 ug/kg/min via INTRAVENOUS
  Administered 2019-11-22: 150 ug/kg/min via INTRAVENOUS
  Administered 2019-11-22: 65 ug/kg/min via INTRAVENOUS

## 2019-11-22 MED ORDER — KETAMINE HCL 10 MG/ML IJ SOLN
INTRAMUSCULAR | Status: DC | PRN
  Administered 2019-11-22: 25 mg via INTRAVENOUS

## 2019-11-22 MED ORDER — CHLORHEXIDINE GLUCONATE CLOTH 2 % EX PADS: 6.0000 | MEDICATED_PAD | Freq: Once | CUTANEOUS | Status: DC

## 2019-11-22 MED ORDER — PROPOFOL 10 MG/ML IV BOLUS
INTRAVENOUS | Status: AC
  Filled 2019-11-22: qty 100

## 2019-11-22 MED ORDER — KETAMINE HCL 50 MG/5ML IJ SOSY
PREFILLED_SYRINGE | INTRAMUSCULAR | Status: AC
  Filled 2019-11-22: qty 5

## 2019-11-22 MED ORDER — PROPOFOL 10 MG/ML IV BOLUS
INTRAVENOUS | Status: DC | PRN
  Administered 2019-11-22: 20 mg via INTRAVENOUS

## 2019-11-22 MED ORDER — EPHEDRINE SULFATE 50 MG/ML IJ SOLN
INTRAMUSCULAR | Status: DC | PRN
  Administered 2019-11-22 (×4): 10 mg via INTRAVENOUS

## 2019-11-22 MED ORDER — GLYCOPYRROLATE 0.2 MG/ML IJ SOLN
INTRAMUSCULAR | Status: DC | PRN
  Administered 2019-11-22: .2 mg via INTRAVENOUS

## 2019-11-22 MED ORDER — LACTATED RINGERS IV SOLN
Freq: Once | INTRAVENOUS | Status: AC
  Administered 2019-11-22: 07:00:00 via INTRAVENOUS

## 2019-11-22 NOTE — Op Note (Signed)
[facility] Patient Name: [name] Procedure Date: 11/22/2019 7:14 AM MRN: [id number] Date of Birth: 09/21/1961 Attending MD: Norvel Richards , MD CSN: [id number] Age: 58 Admit Type: Outpatient Procedure:                Colonoscopy Indications:              Iron deficiency anemia Providers:                Norvel Richards, MD, Jeanann Lewandowsky. Sharon Seller, RN,                            Janeece Riggers, RN, Aram Candela Referring MD:              Medicines:                Propofol per Anesthesia Complications:            No immediate complications. Estimated Blood Loss:     Estimated blood loss was minimal. Procedure:                After obtaining informed consent, the colonoscope                            was passed under direct vision. Throughout the                            procedure, the patient's blood pressure, pulse, and                            oxygen saturations were monitored continuously. The                            [id number]-H190DL ([id number]) scope was introduced through                            the anus and advanced to the the terminal ileum. Scope In: 7:52:28 AM Scope Out: 8:16:43 AM Scope Withdrawal Time: 0 hours 17 minutes 2 seconds  Total Procedure Duration: 0 hours 24 minutes 15 seconds  Findings:      The perianal and digital rectal examinations were normal.      Moderate size rectal varices present. Preparation inadequate on the       right side of the colon. Congested appearing colonic mucosa diffusely.      Four semi-pedunculated polyps were found in the splenic flexure. The       polyps were 4 to 5 mm in size. These polyps were removed with a cold       snare. Resection and retrieval were complete. Estimated blood loss was       minimal.      Cold biopsy of the abnormal colonic mucosa on the right side taken.       Distal 5 cm of terminal ileum mucosa appeared normal. Mild lesion may       have been it was cured today by the relatively poor  prep particularly on       the right side. Impression:               - Four 4 to 5 mm polyps at the splenic flexure,  removed with a cold snare. Resected and retrieved.                            Abnormal: Suggestive of portal colopathy.                            Inadequate preparation. Rectal varices. Moderate Sedation:      Moderate (conscious) sedation was personally administered by an       anesthesia professional. The following parameters were monitored: oxygen       saturation, heart rate, blood pressure, respiratory rate, EKG, adequacy       of pulmonary ventilation, and response to care. Recommendation:           - Patient has a contact number available for                            emergencies. The signs and symptoms of potential                            delayed complications were discussed with the                            patient. Return to normal activities tomorrow.                            Written discharge instructions were provided to the                            patient.                           - Advance diet as tolerated. Follow-up on                            pathology. Would suggest a early interval repeat                            colonoscopy to be determined. Follow-up on                            pathology. See EGD report. Procedure Code(s):        --- Professional ---                           [phone], Colonoscopy, flexible; with removal of                            tumor(s), polyp(s), or other lesion(s) by snare                            technique Diagnosis Code(s):        --- Professional ---                           K63.5, Polyp of colon  D50.9, Iron deficiency anemia, unspecified CPT copyright 2019 American Medical Association. All rights reserved. The codes documented in this report are preliminary and upon coder review may  be revised to meet current compliance requirements. [name].  [name], MD Norvel Richards, MD 11/22/2019 8:42:47 AM This report has been signed electronically. Number of Addenda: 0

## 2019-11-22 NOTE — Op Note (Signed)
[facility] Patient Name: [name] Procedure Date: 11/22/2019 7:16 AM MRN: [id number] Date of Birth: 1961/05/23 Attending MD: Norvel Richards , MD CSN: [id number] Age: 58 Admit Type: Outpatient Procedure:                Upper GI endoscopy Indications:              Iron deficiency anemia Providers:                Norvel Richards, MD, Janeece Riggers, RN, Aram Candela Referring MD:              Medicines:                Propofol per Anesthesia Complications:            No immediate complications. Estimated Blood Loss:     Estimated blood loss was minimal. Procedure:                Pre-Anesthesia Assessment:                           - Prior to the procedure, a History and Physical                            was performed, and patient medications and                            allergies were reviewed. The patient's tolerance of                            previous anesthesia was also reviewed. The risks                            and benefits of the procedure and the sedation                            options and risks were discussed with the patient.                            All questions were answered, and informed consent                            was obtained. Prior Anticoagulants: The patient has                            taken no previous anticoagulant or antiplatelet                            agents. ASA Grade Assessment: II - A patient with                            mild systemic disease. After reviewing the risks  and benefits, the patient was deemed in                            satisfactory condition to undergo the procedure.                           After obtaining informed consent, the endoscope was                            passed under direct vision. Throughout the                            procedure, the patient's blood pressure, pulse, and                            oxygen saturations  were monitored continuously. The                            GIF-H190 ([id number]) was introduced through the                            mouth, and advanced to the second part of duodenum.                            The upper GI endoscopy was accomplished without                            difficulty. The patient tolerated the procedure                            well. Scope In: 7:41:42 AM Scope Out: 7:47:53 AM Total Procedure Duration: 0 hours 6 minutes 11 seconds  Findings:      4 columns grade 2-3 esophageal varices involving the distal one third of       the tubular esophagus. Overlying mucosa appeared normal otherwise.      Abnormal gastric mucosa multiple 5 to 6 mm benign-appearing polyps       involving the antrum. Marked injected/erythematous gastric mucosa       diffusely. Some snakeskin of the body and fundal mucosa consistent with       portal gastropathy. No gastric varices ulcer or infiltrating process       observed. Pylorus patent. Examination of the bulb and second portion       revealed no abnormalities biopsies of the polyp and abnormal gastric       mucosa taken for histologic study. Impression:               - Esophageal varices. Portal gastropathy.                           -Gastric polyp/abnormal gastric mucosa?"status post                            biopsy].                           - Moderate Sedation:  Moderate (conscious) sedation was personally administered by an       anesthesia professional. The following parameters were monitored: oxygen       saturation, heart rate, blood pressure, respiratory rate, EKG, adequacy       of pulmonary ventilation, and response to care. Recommendation:           - Patient has a contact number available for                            emergencies. The signs and symptoms of potential                            delayed complications were discussed with the                            patient. Return to normal activities  tomorrow.                            Written discharge instructions were provided to the                            patient.                           - Advance diet as tolerated.                           - Continue present medications.                           - Await pathology results.                           - Return to my office in 6 weeks. We will begin                            cirrhosis care. We will plan to initiate                            nonselective beta-blockade as primary prophylaxis                            against variceal bleeding at that time as well. See                            colonoscopy report Procedure Code(s):        --- Professional ---                           [phone], Esophagogastroduodenoscopy, flexible,                            transoral; diagnostic, including collection of                            specimen(s) by brushing or washing, when performed                            (  separate procedure) Diagnosis Code(s):        --- Professional ---                           I85.00, Esophageal varices without bleeding                           K31.89, Other diseases of stomach and duodenum                           D50.9, Iron deficiency anemia, unspecified CPT copyright 2019 American Medical Association. All rights reserved. The codes documented in this report are preliminary and upon coder review may  be revised to meet current compliance requirements. [name]. Lucillia Corson, MD Norvel Richards, MD 11/22/2019 8:38:03 AM This report has been signed electronically. Number of Addenda: 0

## 2019-11-22 NOTE — Addendum Note (Signed)
Addendum  created 11/22/19 5750 by Mickel Baas, CRNA   Charge Capture section accepted

## 2019-11-22 NOTE — H&P (Signed)
@LOGO @   Primary Care Physician:  [name], MD Primary Gastroenterologist:  Dr. Gala Romney  Pre-Procedure History & Physical: HPI:  [name] is a 58 y.o. male here for further evaluation of anemia via EGD and colonoscopy.  Patient states diarrhea has resolved since diabetes medications were modified per his endocrinologist.  Negative colonoscopy 10 years ago.  Patient denies any upper GI tract symptoms dysphagia, reflux early satiety nausea abdominal pain denies melena or rectal bleeding.  Past Medical History:  Diagnosis Date  . Anemia 2016   iron deficiency noted in 2018.   . Arthritis   . CAD (coronary artery disease)    Multivessel disease status post CABG 08/2015  . CKD (chronic kidney disease), stage II   . Essential hypertension   . Hyperlipidemia   . Hypertension   . OSA on CPAP   . Thrombocytopenia ([place]) 2016  . Type 2 diabetes mellitus ([place])     Past Surgical History:  Procedure Laterality Date  . APPENDECTOMY    . Biceps tendon surgery Right   . CARDIAC CATHETERIZATION N/A 08/25/2015   Procedure: Left Heart Cath and Coronary Angiography;  Surgeon: Belva Crome, MD;  Location: [facility];  Service: Cardiovascular;  Laterality: N/A;  . CORONARY ARTERY BYPASS GRAFT N/A 08/29/2015   Procedure: CORONARY ARTERY BYPASS GRAFTING (CABG);  Surgeon: [name], MD;  Location: [facility];  Service: Open Heart Surgery;  Laterality: N/A;  . KNEE ARTHROSCOPY Left   . TEE WITHOUT CARDIOVERSION N/A 08/29/2015   Procedure: TRANSESOPHAGEAL ECHOCARDIOGRAM (TEE);  Surgeon: [name], MD;  Location: [facility];  Service: Open Heart Surgery;  Laterality: N/A;  . TOTAL KNEE ARTHROPLASTY Left 03/09/2017   Procedure: TOTAL KNEE ARTHROPLASTY;  Surgeon: Carole Civil, MD;  Location: [facility];  Service: Orthopedics;  Laterality: Left;    Prior to Admission medications   Medication Sig Start Date End Date Taking? Authorizing Provider  albuterol (VENTOLIN HFA) 108  (90 Base) MCG/ACT inhaler Inhale 2 puffs into the lungs every 6 (six) hours as needed for wheezing or shortness of breath. 06/11/19  Yes Padgett, Rae Halsted, MD  aspirin EC 81 MG tablet Take 81 mg by mouth at bedtime.    Yes [provider]  dicyclomine (BENTYL) 10 MG capsule Take 1 capsule (10 mg total) by mouth 3 (three) times daily. 10/10/19 01/08/20 Yes Harper, Tivis Ringer, PA-C  FERROUS SULFATE PO Take 500 mg by mouth daily.    Yes [provider]  Flaxseed, Linseed, (FLAXSEED OIL PO) Take 500 mg by mouth daily.   Yes [provider]  furosemide (LASIX) 20 MG tablet TAKE 1 TABLET BY MOUTH DAILY Patient taking differently: Take 20 mg by mouth daily.  04/16/19  Yes Satira Sark, MD  hydrochlorothiazide (HYDRODIURIL) 25 MG tablet Take 12.5 mg by mouth daily.  01/21/18  Yes [provider]  insulin aspart (NOVOLOG) 100 UNIT/ML injection Inject 5-30 Units into the skin 3 (three) times daily before meals.    Yes [provider]  loratadine (CLARITIN) 10 MG tablet Take 10 mg by mouth daily.   Yes [provider]  metoprolol tartrate (LOPRESSOR) 25 MG tablet TAKE 1/2 TABLET(12.5 MG) BY MOUTH TWICE DAILY Patient taking differently: Take 12.5 mg by mouth 2 (two) times daily.  07/13/19  Yes Satira Sark, MD  montelukast (SINGULAIR) 10 MG tablet Take 1 tablet (10 mg total) by mouth at bedtime. 12/15/18  Yes Kennith Gain, MD  Multiple Vitamins-Minerals (  MULTIVITAMIN ADULT PO) Take 1 tablet by mouth daily.   Yes [provider]  olmesartan (BENICAR) 40 MG tablet Take 40 mg by mouth daily.  04/14/19  Yes [provider]  omeprazole (PRILOSEC) 40 MG capsule Take 40 mg by mouth at bedtime. 09/13/18  Yes [provider]  pravastatin (PRAVACHOL) 20 MG tablet TAKE 1 TABLET(20 MG) BY MOUTH AT BEDTIME Patient taking differently: Take 20 mg by mouth at bedtime.  01/15/19  Yes Satira Sark, MD  traMADol-acetaminophen  (ULTRACET) 37.5-325 MG tablet Take 1 tablet by mouth every 4 (four) hours as needed. 10/01/19  Yes Carole Civil, MD  TRESIBA FLEXTOUCH 200 UNIT/ML SOPN Inject 40-70 Units into the skin at bedtime. Inject 60-70 units into the skin in the morning and 40 units at bedtime 01/02/18  Yes [provider]  TURMERIC CURCUMIN PO Take 1,000 mg by mouth daily.    Yes [provider]  Armodafinil 200 MG TABS Use as needed for sleepiness, by mouth. 1 tab  before 12 noon. Patient taking differently: Take 200 mg by mouth daily at 12 noon.  06/19/19   Dohmeier, Asencion Partridge, MD  Crisaborole (EUCRISA) 2 % OINT Apply 1 application topically 2 (two) times daily as needed. 12/15/18   Kennith Gain, MD  diphenhydrAMINE (BENADRYL) 50 MG tablet Take 1 tablet (50 mg total) by mouth at bedtime as needed for itching. 03/25/17   Carole Civil, MD  methocarbamol (ROBAXIN) 500 MG tablet TAKE 1 TABLET(500 MG) BY MOUTH THREE TIMES DAILY Patient not taking: Reported on 11/15/2019 04/03/19   Carole Civil, MD  traMADol-acetaminophen (ULTRACET) 37.5-325 MG tablet TAKE 1 TABLET BY MOUTH EVERY 4 HOURS FOR UP TO 7 DAYS AS NEEDED Patient not taking: Reported on 11/15/2019 08/28/19   Carole Civil, MD    Allergies as of 09/07/2019  . (No Known Allergies)    Family History  Problem Relation Age of Onset  . Arthritis Other   . Cancer Other   . Diabetes Other   . CAD Father   . Diabetes Mellitus II Father   . CAD Brother   . Diabetes Mellitus II Brother   . Anesthesia problems Neg Hx   . Hypotension Neg Hx   . Malignant hyperthermia Neg Hx   . Pseudochol deficiency Neg Hx   . Colon cancer Neg Hx     Social History   Socioeconomic History  . Marital status: Married    Spouse name: Not on file  . Number of children: Not on file  . Years of education: college  . Highest education level: Not on file  Occupational History  . Occupation: Maintenance tech    Employer: [employer]   Tobacco Use  . Smoking status: Never Smoker  . Smokeless tobacco: Never Used  Substance and Sexual Activity  . Alcohol use: No    Alcohol/week: 0.0 standard drinks  . Drug use: No  . Sexual activity: Yes    Birth control/protection: None  Other Topics Concern  . Not on file  Social History Narrative  . Not on file   Social Determinants of Health   Financial Resource Strain:   . Difficulty of Paying Living Expenses: Not on file  Food Insecurity:   . Worried About Charity fundraiser in the Last Year: Not on file  . Ran Out of Food in the Last Year: Not on file  Transportation Needs:   . Lack of Transportation (Medical): Not on file  .  Lack of Transportation (Non-Medical): Not on file  Physical Activity:   . Days of Exercise per Week: Not on file  . Minutes of Exercise per Session: Not on file  Stress:   . Feeling of Stress : Not on file  Social Connections:   . Frequency of Communication with Friends and Family: Not on file  . Frequency of Social Gatherings with Friends and Family: Not on file  . Attends Religious Services: Not on file  . Active Member of Clubs or Organizations: Not on file  . Attends Archivist Meetings: Not on file  . Marital Status: Not on file  Intimate Partner Violence:   . Fear of Current or Ex-Partner: Not on file  . Emotionally Abused: Not on file  . Physically Abused: Not on file  . Sexually Abused: Not on file    Review of Systems: See HPI, otherwise negative ROS  Physical Exam: BP 106/64   Pulse 65   Temp 97.9 F (36.6 C) (Oral)   Resp 17   Ht 5\' 10"  (1.778 m)   SpO2 98%   BMI 39.46 kg/m  General:   Alert,  Well-developed, well-nourished, pleasant and cooperative in NAD Skin:  Intact without significant lesions or rashes. ENeck:  Supple; no masses or thyromegaly. No significant cervical adenopathy. Lungs:  Clear throughout to auscultation.   No wheezes, crackles, or rhonchi. No acute distress. Heart:  Regular rate and  rhythm; no murmurs, clicks, rubs,  or gallops. Abdomen: Non-distended, normal bowel sounds.  Soft and nontender without appreciable mass or hepatosplenomegaly.  Pulses:  Normal pulses noted. Extremities:  Without clubbing or edema.  Impression/Plan: 58 year old gentleman history of iron deficiency anemia.  He essentially devoid of any GI tract symptoms at this time.  He is due for a screening colonoscopy.  Given IDA will perform EGD and colonoscopy per plan.  The risks, benefits, limitations, imponderables and alternatives regarding both EGD and colonoscopy have been reviewed with the patient. Questions have been answered. All parties agreeable.      Notice: This dictation was prepared with Dragon dictation along with smaller phrase technology. Any transcriptional errors that result from this process are unintentional and may not be corrected upon review.

## 2019-11-22 NOTE — Discharge Instructions (Signed)
°Colonoscopy °Discharge Instructions ° °Read the instructions outlined below and refer to this sheet in the next few weeks. These discharge instructions provide you with general information on caring for yourself after you leave the hospital. Your doctor may also give you specific instructions. While your treatment has been planned according to the most current medical practices available, unavoidable complications occasionally occur. If you have any problems or questions after discharge, call Dr. Rourk at [phone]. °ACTIVITY °· You may resume your regular activity, but move at a slower pace for the next 24 hours.  °· Take frequent rest periods for the next 24 hours.  °· Walking will help get rid of the air and reduce the bloated feeling in your belly (abdomen).  °· No driving for 24 hours (because of the medicine (anesthesia) used during the test).   °· Do not sign any important legal documents or operate any machinery for 24 hours (because of the anesthesia used during the test).  °NUTRITION °· Drink plenty of fluids.  °· You may resume your normal diet as instructed by your doctor.  °· Begin with a light meal and progress to your normal diet. Heavy or fried foods are harder to digest and may make you feel sick to your stomach (nauseated).  °· Avoid alcoholic beverages for 24 hours or as instructed.  °MEDICATIONS °· You may resume your normal medications unless your doctor tells you otherwise.  °WHAT YOU CAN EXPECT TODAY °· Some feelings of bloating in the abdomen.  °· Passage of more gas than usual.  °· Spotting of blood in your stool or on the toilet paper.  °IF YOU HAD POLYPS REMOVED DURING THE COLONOSCOPY: °· No aspirin products for 7 days or as instructed.  °· No alcohol for 7 days or as instructed.  °· Eat a soft diet for the next 24 hours.  °FINDING OUT THE RESULTS OF YOUR TEST °Not all test results are available during your visit. If your test results are not back during the visit, make an appointment  with your caregiver to find out the results. Do not assume everything is normal if you have not heard from your caregiver or the medical facility. It is important for you to follow up on all of your test results.  °SEEK IMMEDIATE MEDICAL ATTENTION IF: °· You have more than a spotting of blood in your stool.  °· Your belly is swollen (abdominal distention).  °· You are nauseated or vomiting.  °· You have a temperature over 101.  °· You have abdominal pain or discomfort that is severe or gets worse throughout the day.  °EGD °Discharge instructions °Please read the instructions outlined below and refer to this sheet in the next few weeks. These discharge instructions provide you with general information on caring for yourself after you leave the hospital. Your doctor may also give you specific instructions. While your treatment has been planned according to the most current medical practices available, unavoidable complications occasionally occur. If you have any problems or questions after discharge, please call your doctor. °ACTIVITY °· You may resume your regular activity but move at a slower pace for the next 24 hours.  °· Take frequent rest periods for the next 24 hours.  °· Walking will help expel (get rid of) the air and reduce the bloated feeling in your abdomen.  °· No driving for 24 hours (because of the anesthesia (medicine) used during the test).  °· You may shower.  °· Do not sign any important   legal documents or operate any machinery for 24 hours (because of the anesthesia used during the test).  NUTRITION  Drink plenty of fluids.   You may resume your normal diet.   Begin with a light meal and progress to your normal diet.   Avoid alcoholic beverages for 24 hours or as instructed by your caregiver.  MEDICATIONS  You may resume your normal medications unless your caregiver tells you otherwise.  WHAT YOU CAN EXPECT TODAY  You may experience abdominal discomfort such as a feeling of fullness  or gas pains.  FOLLOW-UP  Your doctor will discuss the results of your test with you.  SEEK IMMEDIATE MEDICAL ATTENTION IF ANY OF THE FOLLOWING OCCUR:  Excessive nausea (feeling sick to your stomach) and/or vomiting.   Severe abdominal pain and distention (swelling).   Trouble swallowing.   Temperature over 101 F (37.8 C).   Rectal bleeding or vomiting of blood.    Colon Polyps  Polyps are tissue growths inside the body. Polyps can grow in many places, including the large intestine (colon). A polyp may be a round bump or a mushroom-shaped growth. You could have one polyp or several. Most colon polyps are noncancerous (benign). However, some colon polyps can become cancerous over time. Finding and removing the polyps early can help prevent this. What are the causes? The exact cause of colon polyps is not known. What increases the risk? You are more likely to develop this condition if you:  Have a family history of colon cancer or colon polyps.  Are older than 79 or older than 45 if you are African American.  Have inflammatory bowel disease, such as ulcerative colitis or Crohn's disease.  Have certain hereditary conditions, such as: ? Familial adenomatous polyposis. ? Lynch syndrome. ? Turcot syndrome. ? Peutz-Jeghers syndrome.  Are overweight.  Smoke cigarettes.  Do not get enough exercise.  Drink too much alcohol.  Eat a diet that is high in fat and red meat and low in fiber.  Had childhood cancer that was treated with abdominal radiation. What are the signs or symptoms? Most polyps do not cause symptoms. If you have symptoms, they may include:  Blood coming from your rectum when having a bowel movement.  Blood in your stool. The stool may look dark red or black.  Abdominal pain.  A change in bowel habits, such as constipation or diarrhea. How is this diagnosed? This condition is diagnosed with a colonoscopy. This is a procedure in which a lighted,  flexible scope is inserted into the anus and then passed into the colon to examine the area. Polyps are sometimes found when a colonoscopy is done as part of routine cancer screening tests. How is this treated? Treatment for this condition involves removing any polyps that are found. Most polyps can be removed during a colonoscopy. Those polyps will then be tested for cancer. Additional treatment may be needed depending on the results of testing. Follow these instructions at home: Lifestyle  Maintain a healthy weight, or lose weight if recommended by your health care provider.  Exercise every day or as told by your health care provider.  Do not use any products that contain nicotine or tobacco, such as cigarettes and e-cigarettes. If you need help quitting, ask your health care provider.  If you drink alcohol, limit how much you have: ? 0-1 drink a day for women. ? 0-2 drinks a day for men.  Be aware of how much alcohol is in your drink. In  the [place] one drink equals one 12 oz bottle of beer (355 mL), one 5 oz glass of wine (148 mL), or one 1 oz shot of hard liquor (44 mL). Eating and drinking   Eat foods that are high in fiber, such as fruits, vegetables, and whole grains.  Eat foods that are high in calcium and vitamin D, such as milk, cheese, yogurt, eggs, liver, fish, and broccoli.  Limit foods that are high in fat, such as fried foods and desserts.  Limit the amount of red meat and processed meat you eat, such as hot dogs, sausage, bacon, and lunch meats. General instructions  Keep all follow-up visits as told by your health care provider. This is important. ? This includes having regularly scheduled colonoscopies. ? Talk to your health care provider about when you need a colonoscopy. Contact a health care provider if:  You have new or worsening bleeding during a bowel movement.  You have new or increased blood in your stool.  You have a change in bowel habits.  You lose  weight for no known reason. Summary  Polyps are tissue growths inside the body. Polyps can grow in many places, including the colon.  Most colon polyps are noncancerous (benign), but some can become cancerous over time.  This condition is diagnosed with a colonoscopy.  Treatment for this condition involves removing any polyps that are found. Most polyps can be removed during a colonoscopy. This information is not intended to replace advice given to you by your health care provider. Make sure you discuss any questions you have with your health care provider. Document Released: 08/25/2004 Document Revised: 03/16/2018 Document Reviewed: 03/16/2018 Elsevier Patient Education  2020 [place] After These instructions provide you with information about caring for yourself after your procedure. Your health care provider may also give you more specific instructions. Your treatment has been planned according to current medical practices, but problems sometimes occur. Call your health care provider if you have any problems or questions after your procedure. What can I expect after the procedure? After your procedure, you may:  Feel sleepy for several hours.  Feel clumsy and have poor balance for several hours.  Feel forgetful about what happened after the procedure.  Have poor judgment for several hours.  Feel nauseous or vomit.  Have a sore throat if you had a breathing tube during the procedure. Follow these instructions at home: For at least 24 hours after the procedure:      Have a responsible adult stay with you. It is important to have someone help care for you until you are awake and alert.  Rest as needed.  Do not: ? Participate in activities in which you could fall or become injured. ? Drive. ? Use heavy machinery. ? Drink alcohol. ? Take sleeping pills or medicines that cause drowsiness. ? Make important decisions or sign legal  documents. ? Take care of children on your own. Eating and drinking  Follow the diet that is recommended by your health care provider.  If you vomit, drink water, juice, or soup when you can drink without vomiting.  Make sure you have little or no nausea before eating solid foods. General instructions  Take over-the-counter and prescription medicines only as told by your health care provider.  If you have sleep apnea, surgery and certain medicines can increase your risk for breathing problems. Follow instructions from your health care provider about wearing your sleep device: ?  Anytime you are sleeping, including during daytime naps. ? While taking prescription pain medicines, sleeping medicines, or medicines that make you drowsy.  If you smoke, do not smoke without supervision.  Keep all follow-up visits as told by your health care provider. This is important. Contact a health care provider if:  You keep feeling nauseous or you keep vomiting.  You feel light-headed.  You develop a rash.  You have a fever. Get help right away if:  You have trouble breathing. Summary  For several hours after your procedure, you may feel sleepy and have poor judgment.  Have a responsible adult stay with you for at least 24 hours or until you are awake and alert. This information is not intended to replace advice given to you by your health care provider. Make sure you discuss any questions you have with your health care provider. Document Released: 03/21/2016 Document Revised: 02/27/2018 Document Reviewed: 03/21/2016 Elsevier Patient Education  2020 [employer].   Further recommendations to follow pending review of pathology report  Colon polyp information provided  Return visit to our office in 6 weeks Wednesday, Jan. 27th at 10:00  At patient request, I called wife, Arrie Aran at [phone] and reviewed results

## 2019-11-22 NOTE — Anesthesia Postprocedure Evaluation (Signed)
Anesthesia Post Note  Patient: [name]  Procedure(s) Performed: COLONOSCOPY WITH PROPOFOL (N/A ) ESOPHAGOGASTRODUODENOSCOPY (EGD) WITH PROPOFOL (N/A ) BIOPSY  Patient location during evaluation: PACU Anesthesia Type: General Level of consciousness: awake and alert, patient cooperative and oriented Pain management: pain level controlled Vital Signs Assessment: post-procedure vital signs reviewed and stable Respiratory status: spontaneous breathing Cardiovascular status: stable Postop Assessment: no apparent nausea or vomiting Anesthetic complications: no     Last Vitals:  Vitals:   11/22/19 0715 11/22/19 0720  BP: (!) 107/58 106/64  Pulse:    Resp:    Temp:    SpO2:      Last Pain:  Vitals:   11/22/19 0658  TempSrc: Oral  PainSc: 0-No pain                 Robin Petrakis A

## 2019-11-22 NOTE — Anesthesia Procedure Notes (Signed)
Procedure Name: General with mask airway Date/Time: 11/22/2019 7:33 AM Performed by: Andree Elk, Braelee Herrle A, CRNA Pre-anesthesia Checklist: Timeout performed, Patient being monitored, Suction available, Emergency Drugs available and Patient identified Patient Re-evaluated:Patient Re-evaluated prior to induction Oxygen Delivery Method: Non-rebreather mask

## 2019-11-22 NOTE — Transfer of Care (Signed)
Immediate Anesthesia Transfer of Care Note  Patient: [name]  Procedure(s) Performed: COLONOSCOPY WITH PROPOFOL (N/A ) ESOPHAGOGASTRODUODENOSCOPY (EGD) WITH PROPOFOL (N/A ) BIOPSY  Patient Location: PACU  Anesthesia Type:General  Level of Consciousness: awake, alert , oriented and patient cooperative  Airway & Oxygen Therapy: Patient Spontanous Breathing  Post-op Assessment: Report given to RN and Post -op Vital signs reviewed and stable  Post vital signs: Reviewed and stable  Last Vitals:  Vitals Value Taken Time  BP 79/33 11/22/19 0822  Temp    Pulse 79 11/22/19 0824  Resp 18 11/22/19 0824  SpO2 97 % 11/22/19 0824  Vitals shown include unvalidated device data.  Last Pain:  Vitals:   11/22/19 0658  TempSrc: Oral  PainSc: 0-No pain      Patients Stated Pain Goal: 10 (35/82/51 [id number])  Complications: No apparent anesthesia complications

## 2019-11-22 NOTE — Anesthesia Preprocedure Evaluation (Addendum)
Anesthesia Evaluation  Patient identified by MRN, date of birth, ID band Patient awake    Reviewed: Allergy & Precautions, NPO status , Patient's Chart, lab work & pertinent test results, reviewed documented beta blocker date and time   Airway Mallampati: III  TM Distance: >3 FB Neck ROM: Full    Dental no notable dental hx.    Pulmonary sleep apnea and Continuous Positive Airway Pressure Ventilation ,    Pulmonary exam normal breath sounds clear to auscultation       Cardiovascular METS: 3 - Mets hypertension, Pt. on medications and Pt. on home beta blockers + angina + CAD, + Past MI and + CABG  Normal cardiovascular exam Rhythm:Regular Rate:Normal  History:   PMH:  Acquired from the patient and from the patient&'s chart.  PMH:  OSA on CPAP. Non-ST elevation MI (NSTEMI) Non-ST elevation MI (NSTEMI)  Risk factors:  Hypertension. Diabetes mellitus. Dyslipidemia.  ------------------------------------------------------------------- Study Conclusions  - Left ventricle: The cavity size was normal. Wall thickness was   increased in a pattern of mild LVH. Systolic function was normal.   The estimated ejection fraction was in the range of 55% to 60%.   Wall motion was normal; there were no regional wall motion   abnormalities. Left ventricular diastolic function parameters   were normal for the patient&'s age. - Aortic valve: Moderately calcified annulus. Trileaflet; mildly   calcified leaflets. There was mild stenosis. Mean gradient (S):   10 mm Hg. Peak gradient (S): 18 mm Hg. Valve area (VTI): 1.68   cm^2. - Mitral valve: Mildly calcified annulus. There was trivial   regurgitation. - Right ventricle: Systolic function was low normal. - Right atrium: Central venous pressure (est): 3 mm Hg. - Atrial septum: No defect or patent foramen ovale was identified. - Tricuspid valve: There was trivial regurgitation. - Pulmonary arteries:  PA peak pressure: 9 mm Hg (S). - Pericardium, extracardiac: There was no pericardial effusion.    Neuro/Psych    GI/Hepatic GERD  Medicated and Controlled,  Endo/Other  diabetes, Type 2, Oral Hypoglycemic Agents, Insulin Dependent  Renal/GU Renal InsufficiencyRenal disease     Musculoskeletal  (+) Arthritis ,   Abdominal   Peds  Hematology  (+) anemia ,   Anesthesia Other Findings   Reproductive/Obstetrics                           Anesthesia Physical Anesthesia Plan  ASA: III  Anesthesia Plan: General   Post-op Pain Management:    Induction: Intravenous  PONV Risk Score and Plan: 0 and TIVA  Airway Management Planned: Nasal Cannula, Natural Airway and Simple Face Mask  Additional Equipment:   Intra-op Plan:   Post-operative Plan:   Informed Consent: I have reviewed the patients History and Physical, chart, labs and discussed the procedure including the risks, benefits and alternatives for the proposed anesthesia with the patient or authorized representative who has indicated his/her understanding and acceptance.     Dental advisory given  Plan Discussed with: CRNA  Anesthesia Plan Comments:         Anesthesia Quick Evaluation

## 2019-11-23 ENCOUNTER — Other Ambulatory Visit: Payer: Self-pay

## 2019-11-23 LAB — SURGICAL PATHOLOGY

## 2019-11-24 ENCOUNTER — Encounter: Payer: Self-pay | Admitting: Internal Medicine

## 2019-11-26 ENCOUNTER — Ambulatory Visit (INDEPENDENT_AMBULATORY_CARE_PROVIDER_SITE_OTHER): Payer: Federal, State, Local not specified - PPO | Admitting: Cardiology

## 2019-11-26 ENCOUNTER — Other Ambulatory Visit: Payer: Self-pay

## 2019-11-26 ENCOUNTER — Encounter: Payer: Self-pay | Admitting: Cardiology

## 2019-11-26 VITALS — BP 132/68 | HR 87 | Temp 97.5°F | Ht 70.0 in | Wt 263.0 lb

## 2019-11-26 DIAGNOSIS — I6523 Occlusion and stenosis of bilateral carotid arteries: Secondary | ICD-10-CM

## 2019-11-26 DIAGNOSIS — M7989 Other specified soft tissue disorders: Secondary | ICD-10-CM | POA: Diagnosis not present

## 2019-11-26 DIAGNOSIS — I25119 Atherosclerotic heart disease of native coronary artery with unspecified angina pectoris: Secondary | ICD-10-CM | POA: Diagnosis not present

## 2019-11-26 DIAGNOSIS — I10 Essential (primary) hypertension: Secondary | ICD-10-CM

## 2019-11-26 NOTE — Progress Notes (Signed)
Cardiology Office Note  Date: 11/26/2019   ID: [name], [name] 02-24-1961, MRN [id number]  PCP:  [name], MD  Cardiologist:  Rozann Lesches, MD Electrophysiologist:  None   Chief Complaint  Patient presents with  . Cardiac follow-up    History of Present Illness: [name] is a 58 y.o. male last assessed via telehealth encounter in September.  He presents for a routine follow-up visit.  He tells me that overall he has been doing well, no angina symptoms or unusual shortness of breath with typical activities.  He recently underwent a colonoscopy and is awaiting final results.  Still having difficulty with right knee pain and anticipates surgery eventually.  I reviewed his medications which are outlined below.  Current cardiac regimen includes aspirin, Lasix, Lopressor, Benicar, and Pravachol.  LDL was 55 in June.  He will be due for follow-up carotid Dopplers next June.  I personally reviewed his ECG today which shows sinus rhythm with low voltage and poor R wave progression as before, nonspecific T wave changes.  Past Medical History:  Diagnosis Date  . Anemia   . Arthritis   . CAD (coronary artery disease)    Multivessel disease status post CABG 08/2015  . CKD (chronic kidney disease) stage 3, GFR 30-59 ml/min   . Essential hypertension   . Hyperlipidemia   . OSA on CPAP   . Thrombocytopenia ([place]) 2016  . Type 2 diabetes mellitus ([place])     Past Surgical History:  Procedure Laterality Date  . APPENDECTOMY    . Biceps tendon surgery Right   . BIOPSY  11/22/2019   Procedure: BIOPSY;  Surgeon: Daneil Dolin, MD;  [facility];  Service: Endoscopy;;  . CARDIAC CATHETERIZATION N/A 08/25/2015   Procedure: Left Heart Cath and Coronary Angiography;  Surgeon: Belva Crome, MD;  Location: [place] [facility];  Service: Cardiovascular;  Laterality: N/A;  . COLONOSCOPY WITH PROPOFOL N/A 11/22/2019   Procedure: COLONOSCOPY WITH PROPOFOL;   Surgeon: Daneil Dolin, MD;  [facility];  Service: Endoscopy;  Laterality: N/A;  7:30am  . CORONARY ARTERY BYPASS GRAFT N/A 08/29/2015   Procedure: CORONARY ARTERY BYPASS GRAFTING (CABG);  Surgeon: [name], MD;  Location: [facility];  Service: Open Heart Surgery;  Laterality: N/A;  . ESOPHAGOGASTRODUODENOSCOPY (EGD) WITH PROPOFOL N/A 11/22/2019   Procedure: ESOPHAGOGASTRODUODENOSCOPY (EGD) WITH PROPOFOL;  Surgeon: Daneil Dolin, MD;  [facility];  Service: Endoscopy;  Laterality: N/A;  . KNEE ARTHROSCOPY Left   . TEE WITHOUT CARDIOVERSION N/A 08/29/2015   Procedure: TRANSESOPHAGEAL ECHOCARDIOGRAM (TEE);  Surgeon: [name], MD;  Location: [facility];  Service: Open Heart Surgery;  Laterality: N/A;  . TOTAL KNEE ARTHROPLASTY Left 03/09/2017   Procedure: TOTAL KNEE ARTHROPLASTY;  Surgeon: Carole Civil, MD;  [facility];  Service: Orthopedics;  Laterality: Left;    Current Outpatient Medications  Medication Sig Dispense Refill  . albuterol (VENTOLIN HFA) 108 (90 Base) MCG/ACT inhaler Inhale 2 puffs into the lungs every 6 (six) hours as needed for wheezing or shortness of breath. 18 g 0  . Armodafinil 200 MG TABS Use as needed for sleepiness, by mouth. 1 tab  before 12 noon. (Patient taking differently: Take 200 mg by mouth daily at 12 noon. ) 30 tablet 5  . aspirin EC 81 MG tablet Take 81 mg by mouth at bedtime.     [name] (EUCRISA) 2 % OINT Apply 1 application topically  2 (two) times daily as needed. 60 g 5  . dicyclomine (BENTYL) 10 MG capsule Take 1 capsule (10 mg total) by mouth 3 (three) times daily. 90 capsule 2  . diphenhydrAMINE (BENADRYL) 50 MG tablet Take 1 tablet (50 mg total) by mouth at bedtime as needed for itching. 30 tablet 0  . FERROUS SULFATE PO Take 500 mg by mouth daily.     . Flaxseed, Linseed, (FLAXSEED OIL PO) Take 500 mg by mouth daily.    . furosemide (LASIX) 20 MG tablet TAKE 1 TABLET BY MOUTH DAILY (Patient taking  differently: Take 20 mg by mouth daily. ) 90 tablet 2  . hydrochlorothiazide (HYDRODIURIL) 25 MG tablet Take 12.5 mg by mouth daily.   3  . insulin aspart (NOVOLOG) 100 UNIT/ML injection Inject 5-30 Units into the skin 3 (three) times daily before meals.     [name] loratadine (CLARITIN) 10 MG tablet Take 10 mg by mouth daily.    . metoprolol tartrate (LOPRESSOR) 25 MG tablet TAKE 1/2 TABLET(12.5 MG) BY MOUTH TWICE DAILY (Patient taking differently: Take 12.5 mg by mouth 2 (two) times daily. ) 90 tablet 3  . montelukast (SINGULAIR) 10 MG tablet Take 1 tablet (10 mg total) by mouth at bedtime. 30 tablet 5  . Multiple Vitamins-Minerals (MULTIVITAMIN ADULT PO) Take 1 tablet by mouth daily.    [name] olmesartan (BENICAR) 40 MG tablet Take 40 mg by mouth daily.     [name] omeprazole (PRILOSEC) 40 MG capsule Take 40 mg by mouth at bedtime.  2  . pravastatin (PRAVACHOL) 20 MG tablet TAKE 1 TABLET(20 MG) BY MOUTH AT BEDTIME (Patient taking differently: Take 20 mg by mouth at bedtime. ) 90 tablet 3  . traMADol-acetaminophen (ULTRACET) 37.5-325 MG tablet Take 1 tablet by mouth every 4 (four) hours as needed. 60 tablet 5  . TRESIBA FLEXTOUCH 200 UNIT/ML SOPN Inject 40-70 Units into the skin at bedtime. Inject 60-70 units into the skin in the morning and 40 units at bedtime    . TURMERIC CURCUMIN PO Take 1,000 mg by mouth daily.      No current facility-administered medications for this visit.   Allergies:  Patient has no known allergies.   Social History: The patient  reports that he has never smoked. He has never used smokeless tobacco. He reports that he does not drink alcohol or use drugs.   ROS:  Please see the history of present illness. Otherwise, complete review of systems is positive for none.  All other systems are reviewed and negative.   Physical Exam: VS:  BP 132/68   Pulse 87   Temp (!) 97.5 F (36.4 C)   Ht 5\' 10"  (1.778 m)   Wt 263 lb (119.3 kg)   SpO2 98%   BMI 37.74 kg/m , BMI Body mass index is  37.74 kg/m.  Wt Readings from Last 3 Encounters:  11/26/19 263 lb (119.3 kg)  11/20/19 275 lb (124.7 kg)  09/05/19 274 lb (124.3 kg)    General: Patient appears comfortable at rest. HEENT: Conjunctiva and lids normal, wearing a mask. Neck: Supple, no elevated JVP or carotid bruits, no thyromegaly. Lungs: Clear to auscultation, nonlabored breathing at rest. Cardiac: Regular rate and rhythm, no S3, soft systolic murmur. Abdomen: Obese, nontender, bowel sounds present. Extremities: Compression stockings in place (he states these have been helpful), distal pulses 2+. Skin: Warm and dry. Musculoskeletal: No kyphosis. Neuropsychiatric: Alert and oriented x3, affect grossly appropriate.  ECG:  An ECG dated 05/04/2018 was  personally reviewed today and demonstrated:  Sinus rhythm with poor R wave progression, rule out old anterior infarct pattern, low voltage.  Recent Labwork: 05/03/2019: ALT 19; AST 42; Hemoglobin 12.5; Platelet Count 72 08/31/2019: TSH 2.400 11/20/2019: BUN 27; Creatinine, Ser 1.52; Potassium 3.8; Sodium 135     Component Value Date/Time   CHOL 120 05/25/2019 1001   TRIG 136 05/25/2019 1001   HDL 38 (L) 05/25/2019 1001   CHOLHDL 3.2 05/25/2019 1001   VLDL 27 05/25/2019 1001   LDLCALC 55 05/25/2019 1001    Other Studies Reviewed Today:  Echocardiogram 05/10/2018: Study Conclusions  - Left ventricle: The cavity size was normal. Wall thickness was increased in a pattern of mild LVH. Systolic function was normal. The estimated ejection fraction was in the range of 55% to 60%. Wall motion was normal; there were no regional wall motion abnormalities. Left ventricular diastolic function parameters were normal for the patient&'s age. - Aortic valve: Moderately calcified annulus. Trileaflet; mildly calcified leaflets. There was mild stenosis. Mean gradient (S): 10 mm Hg. Peak gradient (S): 18 mm Hg. Valve area (VTI): 1.68 cm^2. - Mitral valve: Mildly  calcified annulus. There was trivial regurgitation. - Right ventricle: Systolic function was low normal. - Right atrium: Central venous pressure (est): 3 mm Hg. - Atrial septum: No defect or patent foramen ovale was identified. - Tricuspid valve: There was trivial regurgitation. - Pulmonary arteries: PA peak pressure: 9 mm Hg (S). - Pericardium, extracardiac: There was no pericardial effusion.  Carotid Dopplers 05/28/2019: IMPRESSION: Minimal amount of atherosclerotic plaque results in elevated peak systolic velocities within the bilateral internal carotid arteries compatible with the 50-69% luminal narrowing range bilaterally. Further evaluation with CTA could performed as clinically indicated.  Assessment and Plan:  1.  Multivessel CAD status post CABG in 2016.  He is symptomatically stable on medical therapy which includes aspirin, Lopressor, Benicar, and Pravachol.  LVEF normal in the range of 55 to 60%.  ECG reviewed and stable.  2.  Mixed hyperlipidemia, tolerating Pravachol.  Last LDL 55.  No changes made today.  3.  Obesity, he has lost some weight since last assessment, trying to work on diet.  4.  Leg swelling, improved with use of compression stockings.  He also continues on low-dose Lasix.  5.  Moderate, asymptomatic carotid artery disease.  Continue aspirin and statin.  Anticipate follow-up carotid Dopplers next summer.  Medication Adjustments/Labs and Tests Ordered: Current medicines are reviewed at length with the patient today.  Concerns regarding medicines are outlined above.   Tests Ordered: Orders Placed This Encounter  Procedures  . EKG 12-Lead    Medication Changes: No orders of the defined types were placed in this encounter.   Disposition:  Follow up 6 months in the [place] office.  Signed, Satira Sark, MD, [facility] 11/26/2019 1:44 PM    [facility] [facility] [address] [place], [place] [address] Phone:  [phone]; Fax: [phone]

## 2019-11-26 NOTE — Patient Instructions (Signed)
Medication Instructions:  Your physician recommends that you continue on your current medications as directed. Please refer to the Current Medication list given to you today.  *If you need a refill on your cardiac medications before your next appointment, please call your pharmacy*  Lab Work: None today If you have labs (blood work) drawn today and your tests are completely normal, you will receive your results only by: [name] MyChart Message (if you have MyChart) OR . A paper copy in the mail If you have any lab test that is abnormal or we need to change your treatment, we will call you to review the results.  Testing/Procedures: None today  Follow-Up: At [facility], you and your health needs are our priority.  As part of our continuing mission to provide you with exceptional heart care, we have created designated Provider Care Teams.  These Care Teams include your primary Cardiologist (physician) and Advanced Practice Providers (APPs -  Physician Assistants and Nurse Practitioners) who all work together to provide you with the care you need, when you need it.  Your next appointment:   6 month(s)  The format for your next appointment:   In Person  Provider:   Rozann Lesches, MD  Other Instructions NONE      Thank you for choosing [facility] !

## 2020-01-08 ENCOUNTER — Encounter: Payer: Self-pay | Admitting: Gastroenterology

## 2020-01-08 NOTE — Progress Notes (Signed)
Referring Provider: [name], MD Primary Care Physician:  [name], MD Primary GI Physician: Dr. Gala Romney  Chief Complaint  Patient presents with  . Follow-up    fu pp EGD/TCS    HPI:   [name] is a 59 y.o. male medical history of type 2 diabetes, HTN, HLD, CKD, CAD s/p CABG in 2016, arthritis, polyclonal gammopathy, thrombocytopenia, and anemia with iron deficiency noted in January 2020.    He was last seen in our office on 08/23/19 for initial consult due to diarrhea.  Postprandial diarrhea has been present for 2 months and was improving at the time of office visit.  Only having 2 BMs a day, down from 6 daily.  Prior evaluation with stool studies was negative.  Differentials included celiac disease, lactose intolerance, IBS, possible postinfectious IBS, IBD, pancreatic insufficiency.  Plans to check celiac serologies, TSH, fecal fat, and fecal calprotectin and advised to avoid dairy products.   Also address at the time of office visit was isolated elevated AST, thrombocytopenia, and anemia which have been present since 2016.  Iron deficiency noted in January 2020.  Hepatitis A, B, C negative in January 2020.  Prior ultrasound in 2017 with mild increased parenchymal echogenicity.  Has been following with hematology/oncology with prior work-up including bone marrow biopsy in February 2020 without identified etiology for thrombocytopenia or anemia.  Felt pancytopenia was likely autoimmune with history of polyclonal gammopathy with recommendations to follow with allergist and see rheumatology.  Plans were to update abdominal ultrasound to ensure no changes in his liver and to evaluate his spleen in light of thrombocytopenia.  Also with newly diagnosed iron deficiency, would pursue EGD and TCS for further evaluation.  Labs completed on 08/31/2019.  Celiac serologies negative.  Thyroid function normal.  Fecal fat normal.  Fecal calprotectin elevated at 205.  [place] not completed.    Telephone note on 10/09/2019 with patient reporting intermittent loose or watery diarrhea.  Abdominal pain prior to BMs that resolved thereafter.  Prescription for Bentyl 10 mg up to 3 times daily sent to pharmacy.  Ultimately, patients endocrinologist stopped Kombiglyze XR in November 2020 which improved his diarrhea.  Colonoscopy on 11/22/2019: Four 4-5 mm polyps at the splenic flexure, findings suggestive of portal colopathy, congested appearing colonic mucosa diffusely, rectal varices, and inadequate preparation of right colon.  Pathology with 3 tubular adenomas and hyperplastic polyp.  Right colon biopsy with focal active colitis, main consideration is self-limited (infectious) colitis.  Due to poor prep, recommendations to repeat colonoscopy in 3 months.  EGD on 11/22/2019: Four columns of grade 2-3 esophageal varices, portal gastropathy, gastric polyp/abnormal gastric mucosa s/p biopsy.  Pathology with hyperplastic polyp, mild chronic gastritis, negative H. Pylori.  Recommendations: Return to office in 6 weeks to begin cirrhosis care.  Initiate nonselective beta-blocker as primary prophylaxis against variceal bleed at office visit.  Today:  Diarrhea: This has resolved. BMs daily. Stools are soft and formed. No constipation. No blood in the stool or black stools.   IDA: Taking iron daily. Continues following with cancer center in [place].  Thinks he has an appointment coming up soon.  No lightheadedness, dizziness, pre-syncope or syncope.  No hematuria.  Cirrhosis: No alcohol since 1982. Drank heavily at that time. No history of IV or intranasal drug use. Father had liver cancer in his 46s. No known autoimmune conditions.  No family history of alpha 1 antitrypsin deficiency, hemochromatosis, wilsons disease, thyroid disorders, celiac disease, IBD.  Has chronic lower extremity  edema since CABG in 2016, currently on Lasix 20 mg daily.  No swelling in abdomen. No yellowing of eyes or skin. No  confusion. No dark urine, bruising, or bleeding.   Denies abdominal pain.  No nausea or vomiting.  No heartburn, acid reflux, indigestion, or dysphagia. Has been working on diet and eating healthier in light of diabetes.     Past Medical History:  Diagnosis Date  . Anemia   . Arthritis   . CAD (coronary artery disease)    Multivessel disease status post CABG 08/2015  . CKD (chronic kidney disease) stage 3, GFR 30-59 ml/min   . Essential hypertension   . Hyperlipidemia   . OSA on CPAP   . Polyclonal gammopathy   . Thrombocytopenia ([place]) 2016  . Type 2 diabetes mellitus ([place])     Past Surgical History:  Procedure Laterality Date  . APPENDECTOMY    . Biceps tendon surgery Right   . BIOPSY  11/22/2019   Procedure: BIOPSY;  Surgeon: Daneil Dolin, MD;  [facility];  Service: Endoscopy;;  . CARDIAC CATHETERIZATION N/A 08/25/2015   Procedure: Left Heart Cath and Coronary Angiography;  Surgeon: Belva Crome, MD;  Location: [place] [facility];  Service: Cardiovascular;  Laterality: N/A;  . COLONOSCOPY WITH PROPOFOL N/A 11/22/2019   Procedure: COLONOSCOPY WITH PROPOFOL;  Surgeon: Daneil Dolin, MD; Four 4-5 mm polyps, findings suggestive of portal colopathy, congested appearing colonic mucosa diffusely, rectal varices, and adequate right colon prep.  Pathology with tubular adenomas and hyperplastic polyp.  Right colon biopsy with focal active colitis.  Recommendations to repeat colonoscopy in 3 months due to poor prep.  . CORONARY ARTERY BYPASS GRAFT N/A 08/29/2015   Procedure: CORONARY ARTERY BYPASS GRAFTING (CABG);  Surgeon: [name], MD;  Location: [place];  Service: Open Heart Surgery;  Laterality: N/A;  . ESOPHAGOGASTRODUODENOSCOPY (EGD) WITH PROPOFOL N/A 11/22/2019   Procedure: ESOPHAGOGASTRODUODENOSCOPY (EGD) WITH PROPOFOL;  Surgeon: Daneil Dolin, MD; 4 columns of grade 2-3 esophageal varices, portal gastropathy, gastric polyp/abnormal gastric mucosa s/p  biopsy.  Pathology with hyperplastic polyp, mild chronic gastritis, negative H. pylori.  [name] KNEE ARTHROSCOPY Left   . TEE WITHOUT CARDIOVERSION N/A 08/29/2015   Procedure: TRANSESOPHAGEAL ECHOCARDIOGRAM (TEE);  Surgeon: [name], MD;  Location: [place];  Service: Open Heart Surgery;  Laterality: N/A;  . TOTAL KNEE ARTHROPLASTY Left 03/09/2017   Procedure: TOTAL KNEE ARTHROPLASTY;  Surgeon: Carole Civil, MD;  [facility];  Service: Orthopedics;  Laterality: Left;    Current Outpatient Medications  Medication Sig Dispense Refill  . albuterol (VENTOLIN HFA) 108 (90 Base) MCG/ACT inhaler Inhale 2 puffs into the lungs every 6 (six) hours as needed for wheezing or shortness of breath. 18 g 0  . Armodafinil 200 MG TABS Use as needed for sleepiness, by mouth. 1 tab  before 12 noon. (Patient taking differently: Take 200 mg by mouth daily at 12 noon. ) 30 tablet 5  . aspirin EC 81 MG tablet Take 81 mg by mouth at bedtime.     [name] (EUCRISA) 2 % OINT Apply 1 application topically 2 (two) times daily as needed. 60 g 5  . diphenhydrAMINE (BENADRYL) 50 MG tablet Take 1 tablet (50 mg total) by mouth at bedtime as needed for itching. 30 tablet 0  . FERROUS SULFATE PO Take 500 mg by mouth daily.     . Flaxseed, Linseed, (FLAXSEED OIL PO) Take 500 mg by mouth daily.    [name]  furosemide (LASIX) 20 MG tablet TAKE 1 TABLET BY MOUTH DAILY (Patient taking differently: Take 20 mg by mouth daily. ) 90 tablet 2  . hydrochlorothiazide (HYDRODIURIL) 25 MG tablet Take 12.5 mg by mouth daily.   3  . insulin aspart (NOVOLOG) 100 UNIT/ML injection Inject 5-30 Units into the skin 3 (three) times daily before meals.     [name] loratadine (CLARITIN) 10 MG tablet Take 10 mg by mouth daily.    . metoprolol tartrate (LOPRESSOR) 25 MG tablet TAKE 1/2 TABLET(12.5 MG) BY MOUTH TWICE DAILY (Patient taking differently: Take 12.5 mg by mouth 2 (two) times daily. ) 90 tablet 3  . Multiple Vitamins-Minerals (MULTIVITAMIN  ADULT PO) Take 1 tablet by mouth daily.    [name] olmesartan (BENICAR) 40 MG tablet Take 40 mg by mouth daily.     [name] omeprazole (PRILOSEC) 40 MG capsule Take 40 mg by mouth at bedtime.  2  . pravastatin (PRAVACHOL) 20 MG tablet TAKE 1 TABLET(20 MG) BY MOUTH AT BEDTIME (Patient taking differently: Take 20 mg by mouth at bedtime. ) 90 tablet 3  . traMADol-acetaminophen (ULTRACET) 37.5-325 MG tablet Take 1 tablet by mouth every 4 (four) hours as needed. (Patient taking differently: Take 1 tablet by mouth as needed. ) 60 tablet 5  . TRESIBA FLEXTOUCH 200 UNIT/ML SOPN Inject 40-70 Units into the skin at bedtime. Inject 60-70 units into the skin in the morning and 40 units at bedtime    . TURMERIC CURCUMIN PO Take 1,000 mg by mouth daily.     [name] dicyclomine (BENTYL) 10 MG capsule Take 1 capsule (10 mg total) by mouth 3 (three) times daily. (Patient not taking: Reported on 01/09/2020) 90 capsule 2   No current facility-administered medications for this visit.    Allergies as of 01/09/2020  . (No Known Allergies)    Family History  Problem Relation Age of Onset  . Arthritis Other   . Cancer Other   . Diabetes Other   . CAD Father   . Diabetes Mellitus II Father   . Liver cancer Father   . Hodgkin's lymphoma Father   . CAD Brother   . Diabetes Mellitus II Brother   . Anesthesia problems Neg Hx   . Hypotension Neg Hx   . Malignant hyperthermia Neg Hx   . Pseudochol deficiency Neg Hx   . Colon cancer Neg Hx     Social History   Socioeconomic History  . Marital status: Married    Spouse name: Not on file  . Number of children: Not on file  . Years of education: college  . Highest education level: Not on file  Occupational History  . Occupation: Maintenance tech    Employer: [employer]  Tobacco Use  . Smoking status: Never Smoker  . Smokeless tobacco: Never Used  Substance and Sexual Activity  . Alcohol use: No    Alcohol/week: 0.0 standard drinks  . Drug use: No  . Sexual  activity: Not on file  Other Topics Concern  . Not on file  Social History Narrative  . Not on file   Social Determinants of Health   Financial Resource Strain:   . Difficulty of Paying Living Expenses: Not on file  Food Insecurity:   . Worried About Charity fundraiser in the Last Year: Not on file  . Ran Out of Food in the Last Year: Not on file  Transportation Needs:   . Lack of Transportation (Medical): Not on file  . Lack  of Transportation (Non-Medical): Not on file  Physical Activity:   . Days of Exercise per Week: Not on file  . Minutes of Exercise per Session: Not on file  Stress:   . Feeling of Stress : Not on file  Social Connections:   . Frequency of Communication with Friends and Family: Not on file  . Frequency of Social Gatherings with Friends and Family: Not on file  . Attends Religious Services: Not on file  . Active Member of Clubs or Organizations: Not on file  . Attends Archivist Meetings: Not on file  . Marital Status: Not on file    Review of Systems: Gen: Denies fever or chills. CV: Denies chest pain or heart palpitations. Resp: Denies shortness of breath or cough. GI: See HPI Derm: Denies rash Psych: Denies depression or anxiety Heme: See HPI  Physical Exam: BP 126/68   Pulse 81   Temp (!) 97.3 F (36.3 C)   Ht 5' 10.5" (1.791 m)   Wt 275 lb (124.7 kg)   BMI 38.90 kg/m  General:   Alert and oriented. No distress noted. Pleasant and cooperative.  Head:  Normocephalic and atraumatic. Eyes:  Conjuctiva clear without scleral icterus. Heart:  S1, S2 present without murmurs appreciated. Lungs:  Clear to auscultation bilaterally. No wheezes, rales, or rhonchi. No distress.  Abdomen:  Obese. +BS, soft, non-tender and non-distended. No rebound or guarding. No HSM or masses noted. Msk:  Symmetrical without gross deformities. Normal posture. Extremities: 1-2 + pitting edema in the right leg.  Minimal pitting edema in the left leg.  Lower  extremities with chronic venous stasis changes.  Asymmetric swelling of the lower extremities is chronic.  Cardiology feels this is secondary to prior vein harvesting. Neurologic:  Alert and  oriented x4 Psych: Normal mood and affect.

## 2020-01-09 ENCOUNTER — Other Ambulatory Visit: Payer: Self-pay

## 2020-01-09 ENCOUNTER — Encounter: Payer: Self-pay | Admitting: Gastroenterology

## 2020-01-09 ENCOUNTER — Ambulatory Visit (INDEPENDENT_AMBULATORY_CARE_PROVIDER_SITE_OTHER): Payer: Federal, State, Local not specified - PPO | Admitting: Gastroenterology

## 2020-01-09 VITALS — BP 126/68 | HR 81 | Temp 97.3°F | Ht 70.5 in | Wt 275.0 lb

## 2020-01-09 DIAGNOSIS — Z86010 Personal history of colonic polyps: Secondary | ICD-10-CM | POA: Insufficient documentation

## 2020-01-09 DIAGNOSIS — Z860101 Personal history of adenomatous and serrated colon polyps: Secondary | ICD-10-CM | POA: Insufficient documentation

## 2020-01-09 DIAGNOSIS — R197 Diarrhea, unspecified: Secondary | ICD-10-CM

## 2020-01-09 DIAGNOSIS — I8500 Esophageal varices without bleeding: Secondary | ICD-10-CM | POA: Insufficient documentation

## 2020-01-09 DIAGNOSIS — D509 Iron deficiency anemia, unspecified: Secondary | ICD-10-CM | POA: Diagnosis not present

## 2020-01-09 DIAGNOSIS — D649 Anemia, unspecified: Secondary | ICD-10-CM | POA: Diagnosis not present

## 2020-01-09 DIAGNOSIS — R7401 Elevation of levels of liver transaminase levels: Secondary | ICD-10-CM | POA: Diagnosis not present

## 2020-01-09 NOTE — Assessment & Plan Note (Addendum)
Patient has had isolated mild elevation of AST since 2016.  Also with history of anemia and thrombocytopenia since 2016.  Most recent labs in May 2020 with AST stable at 42.  Hepatitis A, B, and C negative in January 2020.  Last ultrasound on file from 2017 with mild increased parenchymal echogenicity of the liver.  EGD/TCS in December 2020 for IDA revealed rectal varices and findings suggestive of portal colopathy as well as four grade 2-3 esophageal varices, portal gastropathy.   I suspect patient likely has cirrhosis secondary to NASH with history of diabetes, HTN, HLD, and obesity.  He is without signs or symptoms of decompensated cirrhosis. He has a remote history of heavy drinking prior to 1982 but has not drank any alcohol since.  No IV or intranasal drug use.  With history of polyclonal gammopathy, cannot rule out possible autoimmune causes.  Reports father had liver cancer in his 2s.  No personal history or family history of autoimmune conditions.  No known family history of alpha-1 antitrypsin deficiency, hemochromatosis, Wilson's disease, or other liver problems.  CBC, HFP, BMP, INR, AFP ANA, AMA, ASMA, alpha-1 antitrypsin phenotype, ceruloplasmin, immunoglobulins Ultrasound abdomen complete Patient needs to start nonselective beta-blocker for esophageal varices bleeding prophylaxis.  We will likely start nadolol pending result of kidney function. He is currently on Lasix 20 mg daily for lower extremity edema.  For now, he will continue on this.  Could consider addition of Aldactone in the future if needed. Follow-up in 6 months.

## 2020-01-09 NOTE — Assessment & Plan Note (Addendum)
Colonoscopy on 11/22/2019 with 3 tubular adenomas and 1 hyperplastic polyp.  Also with findings suggestive of portal colopathy, rectal varices.  Also with biopsy of right colon that revealed focal active colitis with main consideration being self-limited (infectious) colitis. He had inadequate preparation of the right colon with recommendations to repeat colonoscopy in 3 months.  He is without any significant lower GI symptoms at this time. No alarm symptoms.   Proceed with TCS with propofol with Dr. Gala Romney in the near future. The risks, benefits, and alternatives have been discussed in detail with patient. They have stated understanding and desire to proceed.  Due to poor prep, patient will complete an extra 1/2-day of clears an extra one half dose of prep.  He will also have 3 days of Linzess 145 mcg daily prior to colon prep. Advised to monitor his blood sugars closely while he is on clear liquids and correct any low blood sugars with sugary clear liquids. One half dose of Tresiba the night before his procedure.  He will hold this the morning of his procedure. Hold iron x7 days prior to procedure. Follow-up in 6 months for cirrhosis care.

## 2020-01-09 NOTE — Patient Instructions (Signed)
We will get you scheduled for repeat TCS in the near future with Dr. Gala Romney.  Hold iron x 7 days 1 day prior: 1/2 night time dose of [place].  Day of: Hold diabetes medications the morning of.  Check blood sugars frequently while on clear liquids. If you feel your blood sugars dropping, correct this with sugary clear liquids.   Have labs and [place] completed.   We will call you with further recommendations once labs and [place] are completed.   Follow-up in 6 months.   Aliene Altes, PA-C [facility]

## 2020-01-09 NOTE — Assessment & Plan Note (Signed)
Addressed below under elevated AST.

## 2020-01-09 NOTE — Progress Notes (Signed)
CC'ED TO PCP 

## 2020-01-09 NOTE — Assessment & Plan Note (Signed)
Resolved.  Endocrinologist took patient off of Kombiglyze XR in November 2020 and he has not had diarrhea since.  Continue to monitor.

## 2020-01-09 NOTE — Assessment & Plan Note (Addendum)
59 year old male with history of anemia at least since 2016 with diagnosis of iron deficiency in January 2020 and was started on oral iron.  FOBT negative in February 2020. He has been followed by oncology/hematology with prior work-up including bone marrow biopsy in February 2020 without identified etiology of anemia.  He does have history of polyclonal gammopathy and oncology felt anemia could be related to autoimmune conditions and recommended he continue to follow with allergist and see rheumatology.  Celiac serologies negative.  Colonoscopy and EGD completed in December 2020 to evaluate possible GI source of IDA.  He with 3 tubular adenomas and 1 hyperplastic polyp, findings suggestive of portal colopathy, rectal varices.  Inadequate prep of right colon.  EGD with four columns of grade 2-3 esophageal varices, portal gastropathy, gastric polyp/abnormal gastric mucosa biopsy.  Pathology with hyperplastic polyp and mild chronic gastritis, negative H. Pylori.  Patient is without any overt GI bleeding at this time.  Last hemoglobin 12.5 in May 2020.  Iron panel not checked since January.  As patient is without overt GI bleeding, will update his labs at this time and continue to monitor.  If he were to have downtrending hemoglobin or iron studies, could consider Givens capsule study in the future.  CBC, iron panel with ferritin Continue iron daily. Monitor for overt GI bleeding. Follow-up in 6 months.

## 2020-01-10 ENCOUNTER — Other Ambulatory Visit: Payer: Self-pay | Admitting: *Deleted

## 2020-01-10 MED ORDER — PRAVASTATIN SODIUM 20 MG PO TABS: ORAL_TABLET | ORAL | 3 refills | Status: DC

## 2020-01-10 MED ORDER — FUROSEMIDE 20 MG PO TABS: 20.0000 mg | ORAL_TABLET | Freq: Every day | ORAL | 2 refills | Status: DC

## 2020-01-14 NOTE — Progress Notes (Signed)
Hemoglobin slightly low but stable at 12.4. Platelets low at 55, likely related to cirrhosis. AST slightly elevated at 47, but stable. Other LFTs and bilirubin within normal limits. Autoimmune labs unrevealing other than elevated IgA which he has been following with hematology/allergy for. INR normal. AFP within normal limits. Electrolytes within normal limits. Kidney function remains elevated but stable with Creatinine at 1.4. GFR 54. Iron panel with iron elevated at 216, percent saturation elevated at 64%.  He has history of IDA and is on oral iron.  He should follow up with hematology and consider discontinuing iron.   Based on these labs, MELD 11, Child Pugh A (well compensated).  Waiting on [place] to confirm cirrhosis.  He should be following a low sodium diet. No more than 2 g daily.  We need to get him started on nadolol 20 mg daily with goal of resting HR between 55-60. Systolic BP should stay above 90. Is he able to check his HR and BP at home? Is so, we need to call him in 1 week for an update on HR and BP. If he isn't able to check HR and BP at home, he will need to stop by to have this checked for no charge.  Possible side effects: fatigue, lightheadedness, dizziness, drowsiness.   Additionally: Glucose is quite elevated at 239 (I do not think these labs were fasting). He needs to be sure he is taking his diabetes medications and watching his sugar/carbohydrate intake closely. He should follow up with PCP regarding diabetes.

## 2020-01-15 ENCOUNTER — Ambulatory Visit (HOSPITAL_COMMUNITY)
Admission: RE | Admit: 2020-01-15 | Discharge: 2020-01-15 | Disposition: A | Discharge status: Home / Self Care | Payer: Federal, State, Local not specified - PPO | Source: Ambulatory Visit | Attending: Gastroenterology | Admitting: Gastroenterology

## 2020-01-15 ENCOUNTER — Other Ambulatory Visit: Payer: Self-pay

## 2020-01-15 DIAGNOSIS — I8500 Esophageal varices without bleeding: Secondary | ICD-10-CM | POA: Diagnosis not present

## 2020-01-15 DIAGNOSIS — K7689 Other specified diseases of liver: Secondary | ICD-10-CM | POA: Diagnosis not present

## 2020-01-15 DIAGNOSIS — R7401 Elevation of levels of liver transaminase levels: Secondary | ICD-10-CM | POA: Diagnosis not present

## 2020-01-15 DIAGNOSIS — K824 Cholesterolosis of gallbladder: Secondary | ICD-10-CM | POA: Diagnosis not present

## 2020-01-16 LAB — CBC WITH DIFFERENTIAL/PLATELET
Absolute Monocytes: 386 cells/uL (ref 200–950)
Basophils Absolute: 21 cells/uL (ref 0–200)
Basophils Relative: 0.5 %
Eosinophils Absolute: 71 cells/uL (ref 15–500)
Eosinophils Relative: 1.7 %
HCT: 35.1 % — ABNORMAL LOW (ref 38.5–50.0)
Hemoglobin: 12.4 g/dL — ABNORMAL LOW (ref 13.2–17.1)
Lymphs Abs: 907 cells/uL (ref 850–3900)
MCH: 34.7 pg — ABNORMAL HIGH (ref 27.0–33.0)
MCHC: 35.3 g/dL (ref 32.0–36.0)
MCV: 98.3 fL (ref 80.0–100.0)
MPV: 11 fL (ref 7.5–12.5)
Monocytes Relative: 9.2 %
Neutro Abs: 2814 cells/uL (ref 1500–7800)
Neutrophils Relative %: 67 %
Platelets: 55 10*3/uL — ABNORMAL LOW (ref 140–400)
RBC: 3.57 10*6/uL — ABNORMAL LOW (ref 4.20–5.80)
RDW: 14.2 % (ref 11.0–15.0)
Total Lymphocyte: 21.6 %
WBC: 4.2 10*3/uL (ref 3.8–10.8)

## 2020-01-16 LAB — BASIC METABOLIC PANEL WITH GFR
BUN/Creatinine Ratio: 19 (calc) (ref 6–22)
BUN: 26 mg/dL — ABNORMAL HIGH (ref 7–25)
CO2: 23 mmol/L (ref 20–32)
Calcium: 9 mg/dL (ref 8.6–10.3)
Chloride: 104 mmol/L (ref 98–110)
Creat: 1.4 mg/dL — ABNORMAL HIGH (ref 0.70–1.25)
GFR, Est African American: 63 mL/min/{1.73_m2} (ref >=60)
GFR, Est Non African American: 54 mL/min/{1.73_m2} — ABNORMAL LOW (ref >=60)
Glucose, Bld: 239 mg/dL — ABNORMAL HIGH (ref 65–139)
Potassium: 4.2 mmol/L (ref 3.5–5.3)
Sodium: 136 mmol/L (ref 135–146)

## 2020-01-16 LAB — MITOCHONDRIAL ANTIBODIES: Mitochondrial M2 Ab, IgG: <=20 U

## 2020-01-16 LAB — HEPATIC FUNCTION PANEL
AG Ratio: 1.2 (calc) (ref 1.0–2.5)
ALT: 20 U/L (ref 9–46)
AST: 47 U/L — ABNORMAL HIGH (ref 10–35)
Albumin: 3.8 g/dL (ref 3.6–5.1)
Alkaline phosphatase (APISO): 66 U/L (ref 35–144)
Bilirubin, Direct: 0.3 mg/dL — ABNORMAL HIGH (ref 0.0–0.2)
Globulin: 3.1 g/dL (calc) (ref 1.9–3.7)
Indirect Bilirubin: 0.7 mg/dL (calc) (ref 0.2–1.2)
Total Bilirubin: 1 mg/dL (ref 0.2–1.2)
Total Protein: 6.9 g/dL (ref 6.1–8.1)

## 2020-01-16 LAB — IRON,TIBC AND FERRITIN PANEL
%SAT: 64 % (calc) — ABNORMAL HIGH (ref 20–48)
Ferritin: 110 ng/mL (ref 24–380)
Iron: 216 ug/dL — ABNORMAL HIGH (ref 50–180)
TIBC: 337 mcg/dL (calc) (ref 250–425)

## 2020-01-16 LAB — ALPHA-1 ANTITRYPSIN PHENOTYPE: A-1 Antitrypsin, Ser: 163 mg/dL (ref 83–199)

## 2020-01-16 LAB — IGG, IGA, IGM
IgG (Immunoglobin G), Serum: 1277 mg/dL (ref 600–1640)
IgM, Serum: 122 mg/dL (ref 50–300)
Immunoglobulin A: 979 mg/dL — ABNORMAL HIGH (ref 47–310)

## 2020-01-16 LAB — ANTI-SMOOTH MUSCLE ANTIBODY, IGG: Actin (Smooth Muscle) Antibody (IGG): <20 U (ref <20)

## 2020-01-16 LAB — ANA: Anti Nuclear Antibody (ANA): NEGATIVE

## 2020-01-16 LAB — CERULOPLASMIN: Ceruloplasmin: 31 mg/dL (ref 18–36)

## 2020-01-16 LAB — PROTIME-INR
INR: 1.1
Prothrombin Time: 11.8 s — ABNORMAL HIGH (ref 9.0–11.5)

## 2020-01-16 LAB — AFP TUMOR MARKER: AFP-Tumor Marker: 2.5 ng/mL (ref <6.1)

## 2020-01-16 NOTE — Progress Notes (Signed)
[place] confirms cirrhosis as suspected with esophageal varices noted on EGD. Spleen is enlarged likely related to his cirrhosis. We will continue with care as planned. He needs to start nadolol as stated on prior lab results. Not sure if these have been discussed with patient. Low sodium diet. No more than 2 g daily.   With confirmed cirrhosis, we need to check immunity to Hep A and B. Please arrange Hep A Ab total and Hep B surface Ab.   2 additional findings: 1. 9 mm gallbladder polyp. Based on his age and size of the polyp, patient needs cholecystectomy as there is increased risk of cancer with larger polyps. Spoke with Dr. Constance Haw, and she recommends surgery in [place].  2. 3.9 cm abdominal aortic aneurysm. This needs to be followed by his PCP. He needs aortic US in 2 years for surveillance.   Elmo Putt, please let patient know the above results and recommendations. Have you discussed his prior lab results with him yet?

## 2020-01-17 ENCOUNTER — Telehealth: Payer: Self-pay

## 2020-01-17 ENCOUNTER — Ambulatory Visit: Payer: Federal, State, Local not specified - PPO | Attending: Internal Medicine

## 2020-01-17 ENCOUNTER — Other Ambulatory Visit: Payer: Self-pay

## 2020-01-17 DIAGNOSIS — J069 Acute upper respiratory infection, unspecified: Secondary | ICD-10-CM | POA: Diagnosis not present

## 2020-01-17 DIAGNOSIS — Z6839 Body mass index (BMI) 39.0-39.9, adult: Secondary | ICD-10-CM | POA: Diagnosis not present

## 2020-01-17 DIAGNOSIS — Z20822 Contact with and (suspected) exposure to covid-19: Secondary | ICD-10-CM

## 2020-01-17 MED ORDER — PEG 3350-KCL-NA BICARB-NACL 420 G PO SOLR: 4000.0000 mL | ORAL | 0 refills | Status: DC

## 2020-01-17 NOTE — Telephone Encounter (Signed)
Pt will need 2 bowel preps for upcoming colonoscopy. 2 Tri-Lyte rx sent to [facility]. Called [facility] and [facility] to inform staff pt will need 2 bowel preps.

## 2020-01-18 LAB — NOVEL CORONAVIRUS, NAA: SARS-CoV-2, NAA: DETECTED — AB

## 2020-01-19 ENCOUNTER — Telehealth: Payer: Self-pay | Admitting: Adult Health

## 2020-01-19 ENCOUNTER — Other Ambulatory Visit: Payer: Self-pay | Admitting: Physician Assistant

## 2020-01-19 DIAGNOSIS — Z951 Presence of aortocoronary bypass graft: Secondary | ICD-10-CM

## 2020-01-19 DIAGNOSIS — E1165 Type 2 diabetes mellitus with hyperglycemia: Secondary | ICD-10-CM

## 2020-01-19 DIAGNOSIS — U071 COVID-19: Secondary | ICD-10-CM

## 2020-01-19 NOTE — Progress Notes (Signed)
  I connected by phone with [name] on 01/19/2020 at 9:51 AM to discuss the potential use of an new treatment for mild to moderate COVID-19 viral infection in non-hospitalized patients.  This patient is a 59 y.o. male that meets the FDA criteria for Emergency Use Authorization of bamlanivimab or casirivimab\imdevimab.  Has a (+) direct SARS-CoV-2 viral test result  Has mild or moderate COVID-19   Is ? 59 years of age and weighs ? 40 kg  Is NOT hospitalized due to COVID-19  Is NOT requiring oxygen therapy or requiring an increase in baseline oxygen flow rate due to COVID-19  Is within 10 days of symptom onset  Has at least one of the high risk factor(s) for progression to severe COVID-19 and/or hospitalization as defined in EUA.  Specific high risk criteria : Cardiovascular Disease, HTN, DMT2, morbid obesity   I have spoken and communicated the following to the patient or parent/caregiver:  1. FDA has authorized the emergency use of bamlanivimab and casirivimab\imdevimab for the treatment of mild to moderate COVID-19 in adults and pediatric patients with positive results of direct SARS-CoV-2 viral testing who are 65 years of age and older weighing at least 40 kg, and who are at high risk for progressing to severe COVID-19 and/or hospitalization.  2. The significant known and potential risks and benefits of bamlanivimab and casirivimab\imdevimab, and the extent to which such potential risks and benefits are unknown.  3. Information on available alternative treatments and the risks and benefits of those alternatives, including clinical trials.  4. Patients treated with bamlanivimab and casirivimab\imdevimab should continue to self-isolate and use infection control measures (e.g., wear mask, isolate, social distance, avoid sharing personal items, clean and disinfect "high touch" surfaces, and frequent handwashing) according to CDC guidelines.   5. The patient or parent/caregiver  has the option to accept or refuse bamlanivimab or casirivimab\imdevimab .  After reviewing this information with the patient, The patient agreed to proceed with receiving the bamlanimivab infusion and will be provided a copy of the Fact sheet prior to receiving the infusion.   Pt set up for 01/20/20 @ 8:30am. Sx onset 01/14/20. Directions given.   [name] 01/19/2020 9:51 AM

## 2020-01-19 NOTE — Telephone Encounter (Signed)
Called patient regarding his positive COVID-19 test results.  Called to check and see how his clinical status is.  Called to discuss possible treatment option of monoclonal antibody infusion as he is in a high risk category for [id number] complications. Left message to call back.  Rexene Edison NP -C

## 2020-01-20 ENCOUNTER — Ambulatory Visit (HOSPITAL_COMMUNITY)
Admission: RE | Admit: 2020-01-20 | Discharge: 2020-01-20 | Disposition: A | Discharge status: Home / Self Care | Payer: Federal, State, Local not specified - PPO | Source: Ambulatory Visit | Attending: Pulmonary Disease | Admitting: Pulmonary Disease

## 2020-01-20 DIAGNOSIS — U071 COVID-19: Secondary | ICD-10-CM | POA: Insufficient documentation

## 2020-01-20 DIAGNOSIS — E1165 Type 2 diabetes mellitus with hyperglycemia: Secondary | ICD-10-CM | POA: Diagnosis not present

## 2020-01-20 DIAGNOSIS — Z951 Presence of aortocoronary bypass graft: Secondary | ICD-10-CM

## 2020-01-20 DIAGNOSIS — Z23 Encounter for immunization: Secondary | ICD-10-CM | POA: Diagnosis not present

## 2020-01-20 MED ORDER — DIPHENHYDRAMINE HCL 50 MG/ML IJ SOLN: 50.0000 mg | Freq: Once | INTRAMUSCULAR | Status: DC | PRN

## 2020-01-20 MED ORDER — METHYLPREDNISOLONE SODIUM SUCC 125 MG IJ SOLR: 125.0000 mg | Freq: Once | INTRAMUSCULAR | Status: DC | PRN

## 2020-01-20 MED ORDER — FAMOTIDINE IN NACL 20-0.9 MG/50ML-% IV SOLN: 20.0000 mg | Freq: Once | INTRAVENOUS | Status: DC | PRN

## 2020-01-20 MED ORDER — SODIUM CHLORIDE 0.9 % IV SOLN
700.0000 mg | Freq: Once | INTRAVENOUS | Status: AC
  Administered 2020-01-20: 700 mg via INTRAVENOUS
  Filled 2020-01-20: qty 20

## 2020-01-20 MED ORDER — ALBUTEROL SULFATE HFA 108 (90 BASE) MCG/ACT IN AERS: 2.0000 | INHALATION_SPRAY | Freq: Once | RESPIRATORY_TRACT | Status: DC | PRN

## 2020-01-20 MED ORDER — EPINEPHRINE 0.3 MG/0.3ML IJ SOAJ: 0.3000 mg | Freq: Once | INTRAMUSCULAR | Status: DC | PRN

## 2020-01-20 MED ORDER — SODIUM CHLORIDE 0.9 % IV SOLN: INTRAVENOUS | Status: DC | PRN

## 2020-01-20 NOTE — Progress Notes (Signed)
  Diagnosis: COVID-19  Physician: Dr. Joya Gaskins  Procedure: [facility] Med: bamlanivimab infusion - Provided patient with bamlanimivab fact sheet for patients, parents and caregivers prior to infusion.  Complications: No immediate complications noted.  Discharge: Discharged home   [name] 01/20/2020

## 2020-01-20 NOTE — Discharge Instructions (Signed)

## 2020-01-23 ENCOUNTER — Other Ambulatory Visit: Payer: Self-pay | Admitting: Gastroenterology

## 2020-01-23 DIAGNOSIS — I8500 Esophageal varices without bleeding: Secondary | ICD-10-CM

## 2020-01-23 DIAGNOSIS — K7460 Unspecified cirrhosis of liver: Secondary | ICD-10-CM

## 2020-01-23 MED ORDER — NADOLOL 20 MG PO TABS: 20.0000 mg | ORAL_TABLET | Freq: Every day | ORAL | 3 refills | Status: DC

## 2020-01-30 DIAGNOSIS — J1282 Pneumonia due to coronavirus disease 2019: Secondary | ICD-10-CM | POA: Diagnosis not present

## 2020-01-30 DIAGNOSIS — Z681 Body mass index (BMI) 19 or less, adult: Secondary | ICD-10-CM | POA: Diagnosis not present

## 2020-02-01 ENCOUNTER — Encounter (HOSPITAL_COMMUNITY): Payer: Self-pay

## 2020-02-01 ENCOUNTER — Other Ambulatory Visit (HOSPITAL_COMMUNITY): Payer: Self-pay | Admitting: Internal Medicine

## 2020-02-01 ENCOUNTER — Ambulatory Visit (HOSPITAL_COMMUNITY)
Admission: RE | Admit: 2020-02-01 | Discharge: 2020-02-01 | Disposition: A | Discharge status: Home / Self Care | Payer: Federal, State, Local not specified - PPO | Source: Ambulatory Visit | Attending: Internal Medicine | Admitting: Internal Medicine

## 2020-02-01 ENCOUNTER — Other Ambulatory Visit: Payer: Self-pay

## 2020-02-01 DIAGNOSIS — R059 Cough, unspecified: Secondary | ICD-10-CM

## 2020-02-01 DIAGNOSIS — R05 Cough: Secondary | ICD-10-CM | POA: Insufficient documentation

## 2020-02-11 ENCOUNTER — Telehealth: Payer: Self-pay | Admitting: Gastroenterology

## 2020-02-11 DIAGNOSIS — K824 Cholesterolosis of gallbladder: Secondary | ICD-10-CM

## 2020-02-11 NOTE — Telephone Encounter (Signed)
[name], please see prior lab notes for complete documentation.   Can you call patient this week to follow-up on HR and BP? He was started on nadolol 20 mg daily.   Additionally, I do not see documentation that patient's [place] results and recommendations were ever relayed to him. He needed labs arranged and is in need of cholecystectomy due to large gallbladder polyp. See original [place] result note dated 01/15/20 for results and complete recommendations. Please let me know if patient is agreeable to cholecystectomy and we will have [facility] clinical pool arrange referral.

## 2020-02-11 NOTE — Telephone Encounter (Signed)
I spoke with pt. Pt feels that he is better with Covid but now has pneumonia. Pt hasn't started Nadol. He says he hasn't received it from his pharmacy. Pt's blood pressure/ heart rate has been good. Pt has been taking it at home and hasn't dropped to 55. Do you want pt to start on Nadolol at this time and d/c Metoprolol while taking it. I will call the pharmacy if so, to give a verbal RX. I see that Nadolol was sent to [facility]. We can arrange lab work and when pt is able to go this week, he can do it. Pt was concerned about having labs done due to the pneumonia.

## 2020-02-11 NOTE — Telephone Encounter (Signed)
Noted  

## 2020-02-11 NOTE — Telephone Encounter (Signed)
Yes, he needs to start nadolol. Not sure why he hasn't received it as I sent Rx to his pharmacy. Please call pharmacy to verify they have Rx and whether or not patient has picked this up. Stop Metoprolol when he starts nadolol. We again need to call in 1 week after he starts nadolol to follow-up on BP and HR.

## 2020-02-11 NOTE — Telephone Encounter (Signed)
[place], I spoke with the pharmacy and they gave pt a 90 day supply of Nadolol on 01/28/2020. I told that Nadolol was written for a 30 day supply with 3 refills and they said we gave pt a 90 day supply. I spoke with pt and he is going to go through his medication and call our office back tomorrow, per pt.

## 2020-02-13 ENCOUNTER — Other Ambulatory Visit: Payer: Self-pay

## 2020-02-13 ENCOUNTER — Ambulatory Visit: Payer: Federal, State, Local not specified - PPO | Attending: Internal Medicine

## 2020-02-13 DIAGNOSIS — Z20822 Contact with and (suspected) exposure to covid-19: Secondary | ICD-10-CM

## 2020-02-14 ENCOUNTER — Ambulatory Visit (HOSPITAL_COMMUNITY)
Admission: RE | Admit: 2020-02-14 | Discharge: 2020-02-14 | Disposition: A | Discharge status: Home / Self Care | Payer: Federal, State, Local not specified - PPO | Source: Ambulatory Visit | Attending: Internal Medicine | Admitting: Internal Medicine

## 2020-02-14 ENCOUNTER — Other Ambulatory Visit (HOSPITAL_COMMUNITY): Payer: Self-pay | Admitting: Internal Medicine

## 2020-02-14 DIAGNOSIS — J683 Other acute and subacute respiratory conditions due to chemicals, gases, fumes and vapors: Secondary | ICD-10-CM | POA: Diagnosis not present

## 2020-02-14 DIAGNOSIS — R05 Cough: Secondary | ICD-10-CM | POA: Diagnosis not present

## 2020-02-14 DIAGNOSIS — R059 Cough, unspecified: Secondary | ICD-10-CM

## 2020-02-14 DIAGNOSIS — J1282 Pneumonia due to coronavirus disease 2019: Secondary | ICD-10-CM | POA: Diagnosis not present

## 2020-02-14 DIAGNOSIS — I10 Essential (primary) hypertension: Secondary | ICD-10-CM | POA: Diagnosis not present

## 2020-02-14 DIAGNOSIS — Z681 Body mass index (BMI) 19 or less, adult: Secondary | ICD-10-CM | POA: Diagnosis not present

## 2020-02-14 LAB — NOVEL CORONAVIRUS, NAA

## 2020-02-15 NOTE — Telephone Encounter (Signed)
[name], can we follow-up with patient?

## 2020-02-19 NOTE — Telephone Encounter (Signed)
Routing to [facility]

## 2020-02-19 NOTE — Addendum Note (Signed)
Addended by: Cheron Every on: 02/19/2020 03:37 PM   Modules accepted: Orders

## 2020-02-19 NOTE — Telephone Encounter (Signed)
Spoke with pt. Pt checked his medication and is taking Nadolol. Pt d/c Metoprolol when he started. Pt doesn't remember when he started the medication but pt has been taking it. Pt is going to keep a log of his BP and HR so when our office follow ups with him, he can give that information to [facility]. Pt's most recent BP was 97/80 and 94/78 per pt.

## 2020-02-19 NOTE — Telephone Encounter (Signed)
Referral sent to [facility] 

## 2020-02-19 NOTE — Telephone Encounter (Signed)
Noted. Spoke with patient. Advised to monitor BP and HR and let us know if his systolic BP drops below 90 or if his HR drops below 55-60. He voiced his understanding.   I also discussed his [place] results with him. He has a 46mm gallbladder polyp and needs cholecystectomy due to increased risk of cancer at his age. See prior US result note. I had discussed this with Dr. Constance Haw and she recommended cholecystectomy in [place].   [facility] Clinical Pool: Please refer to [facility] in [facility] for gallbladder polyp with need for cholecystectomy.

## 2020-03-11 ENCOUNTER — Ambulatory Visit (INDEPENDENT_AMBULATORY_CARE_PROVIDER_SITE_OTHER): Payer: Federal, State, Local not specified - PPO | Admitting: Orthopedic Surgery

## 2020-03-11 ENCOUNTER — Other Ambulatory Visit: Payer: Self-pay

## 2020-03-11 ENCOUNTER — Encounter: Payer: Self-pay | Admitting: Orthopedic Surgery

## 2020-03-11 ENCOUNTER — Ambulatory Visit: Payer: Federal, State, Local not specified - PPO

## 2020-03-11 VITALS — BP 144/73 | HR 134 | Ht 70.5 in | Wt 256.0 lb

## 2020-03-11 DIAGNOSIS — M1711 Unilateral primary osteoarthritis, right knee: Secondary | ICD-10-CM | POA: Diagnosis not present

## 2020-03-11 NOTE — Progress Notes (Signed)
[name]  03/11/2020  Body mass index is 36.21 kg/m.   HISTORY SECTION :  Chief Complaint  Patient presents with  . Knee Pain    right    59 year old male with some ongoing medical problems including thrombocytopenia.  He is seeing hematology and has had a work-up in he was told that he had issues with his liver however he is scheduled to have a colonoscopy a cholecystectomy and perhaps a liver biopsy if not partial hepatectomy  He is considering total knee replacement but obviously he will have to have these issues ironed out especially his platelet count.  He says his knee is hurting worse than it ever has been getting worse with primarily medial joint line pain    Review of Systems  Constitutional: Negative for fever.  Gastrointestinal: Positive for abdominal pain.  All other systems reviewed and are negative.    has a past medical history of Anemia, Arthritis, CAD (coronary artery disease), CKD (chronic kidney disease) stage 3, GFR 30-59 ml/min, Essential hypertension, Hyperlipidemia, OSA on CPAP, Polyclonal gammopathy, Thrombocytopenia ([employer]) (2016), and Type 2 diabetes mellitus ([place]).   Past Surgical History:  Procedure Laterality Date  . APPENDECTOMY    . Biceps tendon surgery Right   . BIOPSY  11/22/2019   Procedure: BIOPSY;  Surgeon: Daneil Dolin, MD;  [facility]: [facility];  Service: Endoscopy;;  . CARDIAC CATHETERIZATION N/A 08/25/2015   Procedure: Left Heart Cath and Coronary Angiography;  Surgeon: Belva Crome, MD;  Location: [place] [facility];  Service: Cardiovascular;  Laterality: N/A;  . COLONOSCOPY WITH PROPOFOL N/A 11/22/2019   Procedure: COLONOSCOPY WITH PROPOFOL;  Surgeon: Daneil Dolin, MD; Four 4-5 mm polyps, findings suggestive of portal colopathy, congested appearing colonic mucosa diffusely, rectal varices, and adequate right colon prep.  Pathology with tubular adenomas and hyperplastic polyp.  Right colon biopsy with focal active  colitis.  Recommendations to repeat colonoscopy in 3 months due to poor prep.  . CORONARY ARTERY BYPASS GRAFT N/A 08/29/2015   Procedure: CORONARY ARTERY BYPASS GRAFTING (CABG);  Surgeon: [name], MD;  Location: [place];  Service: Open Heart Surgery;  Laterality: N/A;  . ESOPHAGOGASTRODUODENOSCOPY (EGD) WITH PROPOFOL N/A 11/22/2019   Procedure: ESOPHAGOGASTRODUODENOSCOPY (EGD) WITH PROPOFOL;  Surgeon: Daneil Dolin, MD; 4 columns of grade 2-3 esophageal varices, portal gastropathy, gastric polyp/abnormal gastric mucosa s/p biopsy.  Pathology with hyperplastic polyp, mild chronic gastritis, negative H. pylori.  [name] KNEE ARTHROSCOPY Left   . TEE WITHOUT CARDIOVERSION N/A 08/29/2015   Procedure: TRANSESOPHAGEAL ECHOCARDIOGRAM (TEE);  Surgeon: [name], MD;  Location: [place];  Service: Open Heart Surgery;  Laterality: N/A;  . TOTAL KNEE ARTHROPLASTY Left 03/09/2017   Procedure: TOTAL KNEE ARTHROPLASTY;  Surgeon: Carole Civil, MD;  Location: [facility];  Service: Orthopedics;  Laterality: Left;    Body mass index is 36.21 kg/m.   No Known Allergies   Current Outpatient Medications:  .  albuterol (VENTOLIN HFA) 108 (90 Base) MCG/ACT inhaler, Inhale 2 puffs into the lungs every 6 (six) hours as needed for wheezing or shortness of breath., Disp: 18 g, Rfl: 0 .  Armodafinil 200 MG TABS, Use as needed for sleepiness, by mouth. 1 tab  before 12 noon. (Patient taking differently: Take 200 mg by mouth daily as needed (energy). ), Disp: 30 tablet, Rfl: 5 .  aspirin EC 81 MG tablet, Take 81 mg by mouth at bedtime. , Disp: , Rfl:  .  b complex vitamins  tablet, Take 1 tablet by mouth daily., Disp: , Rfl:  .  Bioflavonoid Products (ESTER C PO), Take 1 capsule by mouth daily., Disp: , Rfl:  .  bismuth subsalicylate (PEPTO BISMOL) 262 MG/15ML suspension, Take 30 mLs by mouth every 6 (six) hours as needed., Disp: , Rfl:  .  Cholecalciferol (DIALYVITE VITAMIN D 5000) 125 MCG (5000 UT)  capsule, Take 10,000 Units by mouth daily., Disp: , Rfl:  .  Crisaborole (EUCRISA) 2 % OINT, Apply 1 application topically 2 (two) times daily as needed., Disp: 60 g, Rfl: 5 .  diphenhydrAMINE (BENADRYL) 2 % cream, Apply 1 application topically 3 (three) times daily as needed for itching., Disp: , Rfl:  .  ELDERBERRY PO, Take 1 capsule by mouth daily. With  Vitamin c and zinc, Disp: , Rfl:  .  ferrous sulfate 325 (65 FE) MG tablet, Take 325 mg by mouth daily., Disp: , Rfl:  .  Flaxseed, Linseed, (FLAXSEED OIL PO), Take 1,400 mg by mouth daily. , Disp: , Rfl:  .  furosemide (LASIX) 20 MG tablet, Take 1 tablet (20 mg total) by mouth daily., Disp: 90 tablet, Rfl: 2 .  hydrochlorothiazide (HYDRODIURIL) 25 MG tablet, Take 12.5 mg by mouth daily. , Disp: , Rfl: 3 .  insulin aspart (NOVOLOG) 100 UNIT/ML injection, Inject 5-30 Units into the skin 3 (three) times daily before meals. , Disp: , Rfl:  .  loratadine (CLARITIN) 10 MG tablet, Take 10 mg by mouth daily., Disp: , Rfl:  .  Magnesium 250 MG TABS, Take 250 mg by mouth daily., Disp: , Rfl:  .  Multiple Vitamins-Minerals (MULTIVITAMIN ADULT PO), Take 1 tablet by mouth daily., Disp: , Rfl:  .  nadolol (CORGARD) 20 MG tablet, Take 1 tablet (20 mg total) by mouth daily., Disp: 30 tablet, Rfl: 3 .  olmesartan (BENICAR) 40 MG tablet, Take 40 mg by mouth daily. , Disp: , Rfl:  .  omeprazole (PRILOSEC) 40 MG capsule, Take 40 mg by mouth at bedtime., Disp: , Rfl: 2 .  polyethylene glycol-electrolytes (TRILYTE) 420 g solution, Take 4,000 mLs by mouth as directed., Disp: 4000 mL, Rfl: 0 .  pravastatin (PRAVACHOL) 20 MG tablet, TAKE 1 TABLET(20 MG) BY MOUTH AT BEDTIME (Patient taking differently: Take 20 mg by mouth at bedtime. ), Disp: 90 tablet, Rfl: 3 .  traMADol-acetaminophen (ULTRACET) 37.5-325 MG tablet, Take 1 tablet by mouth every 4 (four) hours as needed. (Patient taking differently: Take 1 tablet by mouth every 4 (four) hours as needed for moderate pain.  ), Disp: 60 tablet, Rfl: 5 .  TRESIBA FLEXTOUCH 200 UNIT/ML SOPN, Inject 40-70 Units into the skin See admin instructions. Inject 70 units into the skin in the morning and 40 units at bedtime, Disp: , Rfl:  .  TURMERIC CURCUMIN PO, Take 2,000 mg by mouth daily. , Disp: , Rfl:  .  dicyclomine (BENTYL) 10 MG capsule, Take 1 capsule (10 mg total) by mouth 3 (three) times daily. (Patient not taking: Reported on 03/06/2020), Disp: 90 capsule, Rfl: 2   PHYSICAL EXAM SECTION: 1) BP (!) 144/73   Pulse (!) 134   Ht 5' 10.5" (1.791 m)   Wt 256 lb (116.1 kg)   BMI 36.21 kg/m   Body mass index is 36.21 kg/m. General appearance: Well-developed well-nourished no gross deformities  2) Cardiovascular normal pulse and perfusion , normal color   3) Neurologically deep tendon reflexes are equal and normal, no sensation loss or deficits no pathologic reflexes  4)  Psychological: Awake alert and oriented x3 mood and affect normal  5) Skin no lacerations or ulcerations no nodularity no palpable masses, no erythema or nodularity  6) Musculoskeletal:   His knee does not come to full extension he has a flexion of 115 degrees feels stable he has good muscle tone and extensor mechanism is intact.  Skin is normal   MEDICAL DECISION MAKING  A.  Encounter Diagnosis  Name Primary?  . Primary osteoarthritis of right knee Yes   Images were updated today.  He has a varus knee mild deformity with severe narrowing of his medial compartment and mild to moderate secondary bone changes  Lab report iron 216 TIBC 337% saturation 64% ferritin 110   IgA 979 which was high IgG was normal at 1277 IgM was 122  INR was 11.8  Hemoglobin 12.7 platelet count 55,002 months ago   See report Right now he will have to have his other surgeries done and then if his platelet count can be managed I had like to see at least 100,000 then we could consider doing his knee replacement 6 weeks after removal of the  gallbladder  However, a phone conversation will be needed between me and his hematologist before we can schedule the surgery. No orders of the defined types were placed in this encounter.     Arther Abbott, MD  03/11/2020 4:13 PM

## 2020-03-11 NOTE — Patient Instructions (Addendum)
After gall bladder and liver surgery and colonoscopy approximately 6 weeks after if all blood studies are normal we can replace the knee

## 2020-03-12 ENCOUNTER — Telehealth: Payer: Self-pay | Admitting: Orthopedic Surgery

## 2020-03-12 DIAGNOSIS — E1169 Type 2 diabetes mellitus with other specified complication: Secondary | ICD-10-CM | POA: Diagnosis not present

## 2020-03-12 DIAGNOSIS — R011 Cardiac murmur, unspecified: Secondary | ICD-10-CM | POA: Diagnosis not present

## 2020-03-12 DIAGNOSIS — Z8616 Personal history of COVID-19: Secondary | ICD-10-CM | POA: Diagnosis not present

## 2020-03-12 DIAGNOSIS — I8500 Esophageal varices without bleeding: Secondary | ICD-10-CM | POA: Diagnosis not present

## 2020-03-12 DIAGNOSIS — E669 Obesity, unspecified: Secondary | ICD-10-CM | POA: Diagnosis not present

## 2020-03-12 DIAGNOSIS — K824 Cholesterolosis of gallbladder: Secondary | ICD-10-CM | POA: Diagnosis not present

## 2020-03-12 DIAGNOSIS — K7469 Other cirrhosis of liver: Secondary | ICD-10-CM | POA: Diagnosis not present

## 2020-03-12 DIAGNOSIS — R161 Splenomegaly, not elsewhere classified: Secondary | ICD-10-CM | POA: Diagnosis not present

## 2020-03-12 NOTE — Telephone Encounter (Signed)
Sent message to [employer] md

## 2020-03-12 NOTE — Patient Instructions (Signed)
[name]  03/12/2020     @PREFPERIOPPHARMACY @   Your procedure is scheduled on  03/17/2020 .  Report to [name] at  0730  A.M.  Call this number if you have problems the morning of surgery:  [phone]   Remember:   Follow the diet and prep instructions given to you by Dr Roseanne Kaufman office.                      Take these medicines the morning of surgery with A SIP OF WATER  Nadolol, omeprazole, tramadol. Use your inhaler before you come. Take 1/2 of your usual night time insulin the night before your procedure. DO NOT take any medications for diabetes the morning of your procedure.    Do not wear jewelry, make-up or nail polish.  Do not wear lotions, powders, or perfumes. Please wear deodorant and brush your teeth.  Do not shave 48 hours prior to surgery.  Men may shave face and neck.  Do not bring valuables to the hospital.  [facility] is not responsible for any belongings or valuables.  Contacts, dentures or bridgework may not be worn into surgery.  Leave your suitcase in the car.  After surgery it may be brought to your room.  For patients admitted to the hospital, discharge time will be determined by your treatment team.  Patients discharged the day of surgery will not be allowed to drive home.   Name and phone number of your driver:   family Special instructions:  DO NOT smoke the morning of your procedure.  Please read over the following fact sheets that you were given. Anesthesia Post-op Instructions and Care and Recovery After Surgery       Colonoscopy, Adult, Care After This sheet gives you information about how to care for yourself after your procedure. Your health care provider may also give you more specific instructions. If you have problems or questions, contact your health care provider. What can I expect after the procedure? After the procedure, it is common to have:  A small amount of blood in your stool for 24 hours after the  procedure.  Some gas.  Mild cramping or bloating of your abdomen. Follow these instructions at home: Eating and drinking   Drink enough fluid to keep your urine pale yellow.  Follow instructions from your health care provider about eating or drinking restrictions.  Resume your normal diet as instructed by your health care provider. Avoid heavy or fried foods that are hard to digest. Activity  Rest as told by your health care provider.  Avoid sitting for a long time without moving. Get up to take short walks every 1-2 hours. This is important to improve blood flow and breathing. Ask for help if you feel weak or unsteady.  Return to your normal activities as told by your health care provider. Ask your health care provider what activities are safe for you. Managing cramping and bloating   Try walking around when you have cramps or feel bloated.  Apply heat to your abdomen as told by your health care provider. Use the heat source that your health care provider recommends, such as a moist heat pack or a heating pad. ? Place a towel between your skin and the heat source. ? Leave the heat on for 20-30 minutes. ? Remove the heat if your skin turns bright red. This is especially important if you are unable to feel pain,  heat, or cold. You may have a greater risk of getting burned. General instructions  For the first 24 hours after the procedure: ? Do not drive or use machinery. ? Do not sign important documents. ? Do not drink alcohol. ? Do your regular daily activities at a slower pace than normal. ? Eat soft foods that are easy to digest.  Take over-the-counter and prescription medicines only as told by your health care provider.  Keep all follow-up visits as told by your health care provider. This is important. Contact a health care provider if:  You have blood in your stool 2-3 days after the procedure. Get help right away if you have:  More than a small spotting of blood in  your stool.  Large blood clots in your stool.  Swelling of your abdomen.  Nausea or vomiting.  A fever.  Increasing pain in your abdomen that is not relieved with medicine. Summary  After the procedure, it is common to have a small amount of blood in your stool. You may also have mild cramping and bloating of your abdomen.  For the first 24 hours after the procedure, do not drive or use machinery, sign important documents, or drink alcohol.  Get help right away if you have a lot of blood in your stool, nausea or vomiting, a fever, or increased pain in your abdomen. This information is not intended to replace advice given to you by your health care provider. Make sure you discuss any questions you have with your health care provider. Document Revised: 06/25/2019 Document Reviewed: 06/25/2019 Elsevier Patient Education  [place] These instructions provide you with information about caring for yourself after your procedure. Your health care provider may also give you more specific instructions. Your treatment has been planned according to current medical practices, but problems sometimes occur. Call your health care provider if you have any problems or questions after your procedure. What can I expect after the procedure? After your procedure, you may:  Feel sleepy for several hours.  Feel clumsy and have poor balance for several hours.  Feel forgetful about what happened after the procedure.  Have poor judgment for several hours.  Feel nauseous or vomit.  Have a sore throat if you had a breathing tube during the procedure. Follow these instructions at home: For at least 24 hours after the procedure:      Have a responsible adult stay with you. It is important to have someone help care for you until you are awake and alert.  Rest as needed.  Do not: ? Participate in activities in which you could fall or become injured. ?  Drive. ? Use heavy machinery. ? Drink alcohol. ? Take sleeping pills or medicines that cause drowsiness. ? Make important decisions or sign legal documents. ? Take care of children on your own. Eating and drinking  Follow the diet that is recommended by your health care provider.  If you vomit, drink water, juice, or soup when you can drink without vomiting.  Make sure you have little or no nausea before eating solid foods. General instructions  Take over-the-counter and prescription medicines only as told by your health care provider.  If you have sleep apnea, surgery and certain medicines can increase your risk for breathing problems. Follow instructions from your health care provider about wearing your sleep device: ? Anytime you are sleeping, including during daytime naps. ? While taking prescription pain medicines, sleeping medicines, or medicines  that make you drowsy.  If you smoke, do not smoke without supervision.  Keep all follow-up visits as told by your health care provider. This is important. Contact a health care provider if:  You keep feeling nauseous or you keep vomiting.  You feel light-headed.  You develop a rash.  You have a fever. Get help right away if:  You have trouble breathing. Summary  For several hours after your procedure, you may feel sleepy and have poor judgment.  Have a responsible adult stay with you for at least 24 hours or until you are awake and alert. This information is not intended to replace advice given to you by your health care provider. Make sure you discuss any questions you have with your health care provider. Document Revised: 02/27/2018 Document Reviewed: 03/21/2016 Elsevier Patient Education  [place].

## 2020-03-13 ENCOUNTER — Encounter (HOSPITAL_COMMUNITY): Payer: Self-pay

## 2020-03-13 ENCOUNTER — Encounter (HOSPITAL_COMMUNITY)
Admission: RE | Admit: 2020-03-13 | Discharge: 2020-03-13 | Disposition: A | Discharge status: Home / Self Care | Payer: Federal, State, Local not specified - PPO | Source: Ambulatory Visit | Attending: Internal Medicine | Admitting: Internal Medicine

## 2020-03-13 ENCOUNTER — Other Ambulatory Visit (HOSPITAL_COMMUNITY)
Admission: RE | Admit: 2020-03-13 | Discharge: 2020-03-13 | Disposition: A | Discharge status: Home / Self Care | Payer: Federal, State, Local not specified - PPO | Source: Ambulatory Visit | Attending: Internal Medicine | Admitting: Internal Medicine

## 2020-03-13 ENCOUNTER — Other Ambulatory Visit: Payer: Self-pay

## 2020-03-13 ENCOUNTER — Telehealth: Payer: Self-pay

## 2020-03-13 DIAGNOSIS — Z01812 Encounter for preprocedural laboratory examination: Secondary | ICD-10-CM | POA: Insufficient documentation

## 2020-03-13 LAB — CBC WITH DIFFERENTIAL/PLATELET
Abs Immature Granulocytes: 0.02 10*3/uL (ref 0.00–0.07)
Basophils Absolute: 0 10*3/uL (ref 0.0–0.1)
Basophils Relative: 0 %
Eosinophils Absolute: 0.1 10*3/uL (ref 0.0–0.5)
Eosinophils Relative: 2 %
HCT: 36.8 % — ABNORMAL LOW (ref 39.0–52.0)
Hemoglobin: 12.3 g/dL — ABNORMAL LOW (ref 13.0–17.0)
Immature Granulocytes: 0 %
Lymphocytes Relative: 16 %
Lymphs Abs: 0.9 10*3/uL (ref 0.7–4.0)
MCH: 34.3 pg — ABNORMAL HIGH (ref 26.0–34.0)
MCHC: 33.4 g/dL (ref 30.0–36.0)
MCV: 102.5 fL — ABNORMAL HIGH (ref 80.0–100.0)
Monocytes Absolute: 0.5 10*3/uL (ref 0.1–1.0)
Monocytes Relative: 9 %
Neutro Abs: 4 10*3/uL (ref 1.7–7.7)
Neutrophils Relative %: 73 %
Platelets: 83 10*3/uL — ABNORMAL LOW (ref 150–400)
RBC: 3.59 MIL/uL — ABNORMAL LOW (ref 4.22–5.81)
RDW: 15.5 % (ref 11.5–15.5)
WBC: 5.6 10*3/uL (ref 4.0–10.5)
nRBC: 0 % (ref 0.0–0.2)

## 2020-03-13 LAB — BASIC METABOLIC PANEL
Anion gap: 15 (ref 5–15)
BUN: 17 mg/dL (ref 6–20)
CO2: 23 mmol/L (ref 22–32)
Calcium: 9.4 mg/dL (ref 8.9–10.3)
Chloride: 98 mmol/L (ref 98–111)
Creatinine, Ser: 1.45 mg/dL — ABNORMAL HIGH (ref 0.61–1.24)
GFR calc Af Amer: >60 mL/min (ref >60)
GFR calc non Af Amer: 52 mL/min — ABNORMAL LOW (ref >60)
Glucose, Bld: 346 mg/dL — ABNORMAL HIGH (ref 70–99)
Potassium: 3.6 mmol/L (ref 3.5–5.1)
Sodium: 136 mmol/L (ref 135–145)

## 2020-03-13 LAB — PROTIME-INR
INR: 1.3 — ABNORMAL HIGH (ref 0.8–1.2)
Prothrombin Time: 15.7 seconds — ABNORMAL HIGH (ref 11.4–15.2)

## 2020-03-13 NOTE — Telephone Encounter (Signed)
Patient notified and verbalized understanding. 

## 2020-03-13 NOTE — Telephone Encounter (Signed)
Per Pt he was informed ASA may be decreasing his blood platelets   Please call [phone]   Thanks renee

## 2020-03-13 NOTE — Telephone Encounter (Signed)
Dr. Aline Brochure - ortho - saw him yesterday - was told that his platelets was low & was told by him to check with pharmacy to see if any of his medications could be causing this.  Pharmacy told him that his ASA could be decreasing his platelets.  Per Dr. Ruthe Mannan note - he has suggested she see Hematologist prior to any procedures with him.  He will continue the ASA if Dr. Domenic Polite feels necessary.

## 2020-03-13 NOTE — Telephone Encounter (Signed)
Noted.  I would agree that he should see hematology for follow-up of his thrombocytopenia, particularly if this is inhibiting plans for surgery.  I would not be opposed to holding aspirin if necessary.

## 2020-03-17 ENCOUNTER — Encounter (HOSPITAL_COMMUNITY)
Admission: RE | Disposition: A | Discharge status: Home / Self Care | Payer: Self-pay | Source: Ambulatory Visit | Attending: Internal Medicine

## 2020-03-17 ENCOUNTER — Telehealth: Payer: Self-pay | Admitting: Radiology

## 2020-03-17 ENCOUNTER — Encounter (HOSPITAL_COMMUNITY): Payer: Self-pay | Admitting: Internal Medicine

## 2020-03-17 ENCOUNTER — Ambulatory Visit (HOSPITAL_COMMUNITY): Payer: Federal, State, Local not specified - PPO | Admitting: Anesthesiology

## 2020-03-17 ENCOUNTER — Ambulatory Visit (HOSPITAL_COMMUNITY)
Admission: RE | Admit: 2020-03-17 | Discharge: 2020-03-17 | Disposition: A | Discharge status: Home / Self Care | Payer: Federal, State, Local not specified - PPO | Source: Ambulatory Visit | Attending: Internal Medicine | Admitting: Internal Medicine

## 2020-03-17 DIAGNOSIS — M1990 Unspecified osteoarthritis, unspecified site: Secondary | ICD-10-CM | POA: Insufficient documentation

## 2020-03-17 DIAGNOSIS — Z96652 Presence of left artificial knee joint: Secondary | ICD-10-CM | POA: Diagnosis not present

## 2020-03-17 DIAGNOSIS — G4733 Obstructive sleep apnea (adult) (pediatric): Secondary | ICD-10-CM | POA: Diagnosis not present

## 2020-03-17 DIAGNOSIS — I2510 Atherosclerotic heart disease of native coronary artery without angina pectoris: Secondary | ICD-10-CM | POA: Diagnosis not present

## 2020-03-17 DIAGNOSIS — Z09 Encounter for follow-up examination after completed treatment for conditions other than malignant neoplasm: Secondary | ICD-10-CM | POA: Diagnosis not present

## 2020-03-17 DIAGNOSIS — N183 Chronic kidney disease, stage 3 unspecified: Secondary | ICD-10-CM | POA: Diagnosis not present

## 2020-03-17 DIAGNOSIS — I129 Hypertensive chronic kidney disease with stage 1 through stage 4 chronic kidney disease, or unspecified chronic kidney disease: Secondary | ICD-10-CM | POA: Insufficient documentation

## 2020-03-17 DIAGNOSIS — D696 Thrombocytopenia, unspecified: Secondary | ICD-10-CM

## 2020-03-17 DIAGNOSIS — Z7982 Long term (current) use of aspirin: Secondary | ICD-10-CM | POA: Diagnosis not present

## 2020-03-17 DIAGNOSIS — E785 Hyperlipidemia, unspecified: Secondary | ICD-10-CM | POA: Insufficient documentation

## 2020-03-17 DIAGNOSIS — Z1211 Encounter for screening for malignant neoplasm of colon: Secondary | ICD-10-CM | POA: Diagnosis not present

## 2020-03-17 DIAGNOSIS — Z951 Presence of aortocoronary bypass graft: Secondary | ICD-10-CM | POA: Diagnosis not present

## 2020-03-17 DIAGNOSIS — K6289 Other specified diseases of anus and rectum: Secondary | ICD-10-CM | POA: Diagnosis not present

## 2020-03-17 DIAGNOSIS — Z86010 Personal history of colonic polyps: Secondary | ICD-10-CM | POA: Diagnosis not present

## 2020-03-17 DIAGNOSIS — E1122 Type 2 diabetes mellitus with diabetic chronic kidney disease: Secondary | ICD-10-CM | POA: Diagnosis not present

## 2020-03-17 DIAGNOSIS — I868 Varicose veins of other specified sites: Secondary | ICD-10-CM | POA: Insufficient documentation

## 2020-03-17 DIAGNOSIS — Z9119 Patient's noncompliance with other medical treatment and regimen: Secondary | ICD-10-CM | POA: Diagnosis not present

## 2020-03-17 DIAGNOSIS — Z794 Long term (current) use of insulin: Secondary | ICD-10-CM | POA: Diagnosis not present

## 2020-03-17 DIAGNOSIS — Z79899 Other long term (current) drug therapy: Secondary | ICD-10-CM | POA: Insufficient documentation

## 2020-03-17 HISTORY — PX: COLONOSCOPY WITH PROPOFOL: SHX5780

## 2020-03-17 LAB — GLUCOSE, CAPILLARY
Glucose-Capillary: 89 mg/dL (ref 70–99)
Glucose-Capillary: 98 mg/dL (ref 70–99)

## 2020-03-17 SURGERY — COLONOSCOPY WITH PROPOFOL
Anesthesia: General

## 2020-03-17 MED ORDER — PROPOFOL 500 MG/50ML IV EMUL
INTRAVENOUS | Status: DC | PRN
  Administered 2020-03-17: 100 ug/kg/min via INTRAVENOUS

## 2020-03-17 MED ORDER — LIDOCAINE HCL (CARDIAC) PF 100 MG/5ML IV SOSY
PREFILLED_SYRINGE | INTRAVENOUS | Status: DC | PRN
  Administered 2020-03-17: 4 mg via INTRATRACHEAL

## 2020-03-17 MED ORDER — LIDOCAINE 2% (20 MG/ML) 5 ML SYRINGE
INTRAMUSCULAR | Status: AC
  Filled 2020-03-17: qty 5

## 2020-03-17 MED ORDER — LACTATED RINGERS IV SOLN: INTRAVENOUS | Status: DC | PRN

## 2020-03-17 MED ORDER — LACTATED RINGERS IV SOLN: Freq: Once | INTRAVENOUS | Status: DC

## 2020-03-17 MED ORDER — FENTANYL CITRATE (PF) 100 MCG/2ML IJ SOLN
INTRAMUSCULAR | Status: AC
  Filled 2020-03-17: qty 2

## 2020-03-17 MED ORDER — PROPOFOL 10 MG/ML IV BOLUS
INTRAVENOUS | Status: AC
  Filled 2020-03-17: qty 60

## 2020-03-17 MED ORDER — CHLORHEXIDINE GLUCONATE CLOTH 2 % EX PADS: 6.0000 | MEDICATED_PAD | Freq: Once | CUTANEOUS | Status: DC

## 2020-03-17 MED ORDER — PROPOFOL 10 MG/ML IV BOLUS
INTRAVENOUS | Status: DC | PRN
  Administered 2020-03-17 (×2): 50 mg via INTRAVENOUS

## 2020-03-17 MED ORDER — FENTANYL CITRATE (PF) 100 MCG/2ML IJ SOLN
INTRAMUSCULAR | Status: DC | PRN
  Administered 2020-03-17: 50 ug via INTRAVENOUS

## 2020-03-17 NOTE — H&P (Signed)
@LOGO @   Primary Care Physician:  [name], MD Primary Gastroenterologist:  Dr. Gala Romney  Pre-Procedure History & Physical: HPI:  [name] is a 59 y.o. male here for   Past Medical History:  Diagnosis Date  . Anemia   . Arthritis   . CAD (coronary artery disease)    Multivessel disease status post CABG 08/2015  . CKD (chronic kidney disease) stage 3, GFR 30-59 ml/min   . Essential hypertension   . Hyperlipidemia   . OSA on CPAP   . Polyclonal gammopathy   . Thrombocytopenia ([place]) 2016  . Type 2 diabetes mellitus ([place])     Past Surgical History:  Procedure Laterality Date  . APPENDECTOMY    . Biceps tendon surgery Right   . BIOPSY  11/22/2019   Procedure: BIOPSY;  Surgeon: Daneil Dolin, MD;  Location: [facility];  Service: Endoscopy;;  . CARDIAC CATHETERIZATION N/A 08/25/2015   Procedure: Left Heart Cath and Coronary Angiography;  Surgeon: Belva Crome, MD;  Location: [place] [facility] LAB;  Service: Cardiovascular;  Laterality: N/A;  . COLONOSCOPY WITH PROPOFOL N/A 11/22/2019   Procedure: COLONOSCOPY WITH PROPOFOL;  Surgeon: Daneil Dolin, MD; Four 4-5 mm polyps, findings suggestive of portal colopathy, congested appearing colonic mucosa diffusely, rectal varices, and adequate right colon prep.  Pathology with tubular adenomas and hyperplastic polyp.  Right colon biopsy with focal active colitis.  Recommendations to repeat colonoscopy in 3 months due to poor prep.  . CORONARY ARTERY BYPASS GRAFT N/A 08/29/2015   Procedure: CORONARY ARTERY BYPASS GRAFTING (CABG);  Surgeon: [name], MD;  Location: [place];  Service: Open Heart Surgery;  Laterality: N/A;  . ESOPHAGOGASTRODUODENOSCOPY (EGD) WITH PROPOFOL N/A 11/22/2019   Procedure: ESOPHAGOGASTRODUODENOSCOPY (EGD) WITH PROPOFOL;  Surgeon: Daneil Dolin, MD; 4 columns of grade 2-3 esophageal varices, portal gastropathy, gastric polyp/abnormal gastric mucosa s/p biopsy.  Pathology with hyperplastic polyp,  mild chronic gastritis, negative H. pylori.  [name] KNEE ARTHROSCOPY Left   . TEE WITHOUT CARDIOVERSION N/A 08/29/2015   Procedure: TRANSESOPHAGEAL ECHOCARDIOGRAM (TEE);  Surgeon: [name], MD;  Location: [place];  Service: Open Heart Surgery;  Laterality: N/A;  . TOTAL KNEE ARTHROPLASTY Left 03/09/2017   Procedure: TOTAL KNEE ARTHROPLASTY;  Surgeon: Carole Civil, MD;  Location: [facility];  Service: Orthopedics;  Laterality: Left;    Prior to Admission medications   Medication Sig Start Date End Date Taking? Authorizing Provider  albuterol (VENTOLIN HFA) 108 (90 Base) MCG/ACT inhaler Inhale 2 puffs into the lungs every 6 (six) hours as needed for wheezing or shortness of breath. 06/11/19  Yes Padgett, Rae Halsted, MD  Armodafinil 200 MG TABS Use as needed for sleepiness, by mouth. 1 tab  before 12 noon. Patient taking differently: Take 200 mg by mouth daily as needed (energy).  06/19/19  Yes Dohmeier, Asencion Partridge, MD  aspirin EC 81 MG tablet Take 81 mg by mouth at bedtime.    Yes [provider]  b complex vitamins tablet Take 1 tablet by mouth daily.   Yes [provider]  Bioflavonoid Products (ESTER C PO) Take 1 capsule by mouth daily.   Yes [provider]  bismuth subsalicylate (PEPTO BISMOL) 262 MG/15ML suspension Take 30 mLs by mouth every 6 (six) hours as needed.   Yes [provider]  Cholecalciferol (DIALYVITE VITAMIN D 5000) 125 MCG (5000 UT) capsule Take 10,000 Units by mouth daily.   Yes [provider]  ELDERBERRY PO Take 1 capsule  by mouth daily. With  Vitamin c and zinc   Yes [provider]  ferrous sulfate 325 (65 FE) MG tablet Take 325 mg by mouth daily.   Yes [provider]  Flaxseed, Linseed, (FLAXSEED OIL PO) Take 1,400 mg by mouth daily.    Yes [provider]  furosemide (LASIX) 20 MG tablet Take 1 tablet (20 mg total) by mouth daily. 01/10/20  Yes Satira Sark, MD  hydrochlorothiazide  (HYDRODIURIL) 25 MG tablet Take 12.5 mg by mouth daily.  01/21/18  Yes [provider]  insulin aspart (NOVOLOG) 100 UNIT/ML injection Inject 5-30 Units into the skin 3 (three) times daily before meals.    Yes [provider]  loratadine (CLARITIN) 10 MG tablet Take 10 mg by mouth daily.   Yes [provider]  Magnesium 250 MG TABS Take 250 mg by mouth daily.   Yes [provider]  Multiple Vitamins-Minerals (MULTIVITAMIN ADULT PO) Take 1 tablet by mouth daily.   Yes [provider]  nadolol (CORGARD) 20 MG tablet Take 1 tablet (20 mg total) by mouth daily. 01/23/20  Yes Erenest Rasher, PA-C  olmesartan (BENICAR) 40 MG tablet Take 40 mg by mouth daily.  04/14/19  Yes [provider]  omeprazole (PRILOSEC) 40 MG capsule Take 40 mg by mouth at bedtime. 09/13/18  Yes [provider]  polyethylene glycol-electrolytes (TRILYTE) 420 g solution Take 4,000 mLs by mouth as directed. 01/17/20  Yes Rachna Schonberger, [name], MD  pravastatin (PRAVACHOL) 20 MG tablet TAKE 1 TABLET(20 MG) BY MOUTH AT BEDTIME Patient taking differently: Take 20 mg by mouth at bedtime.  01/10/20  Yes Satira Sark, MD  traMADol-acetaminophen (ULTRACET) 37.5-325 MG tablet Take 1 tablet by mouth every 4 (four) hours as needed. Patient taking differently: Take 1 tablet by mouth every 4 (four) hours as needed for moderate pain.  10/01/19  Yes Carole Civil, MD  TRESIBA FLEXTOUCH 200 UNIT/ML SOPN Inject 40-70 Units into the skin See admin instructions. Inject 70 units into the skin in the morning and 40 units at bedtime 01/02/18  Yes [provider]  TURMERIC CURCUMIN PO Take 2,000 mg by mouth daily.    Yes [provider]  Crisaborole (EUCRISA) 2 % OINT Apply 1 application topically 2 (two) times daily as needed. 12/15/18   Kennith Gain, MD  dicyclomine (BENTYL) 10 MG capsule Take 1 capsule (10 mg total) by mouth 3 (three) times daily. Patient not  taking: Reported on 03/06/2020 10/10/19 03/06/20  Erenest Rasher, PA-C  diphenhydrAMINE (BENADRYL) 2 % cream Apply 1 application topically 3 (three) times daily as needed for itching.    [provider]    Allergies as of 01/17/2020  . (No Known Allergies)    Family History  Problem Relation Age of Onset  . Arthritis Other   . Cancer Other   . Diabetes Other   . CAD Father   . Diabetes Mellitus II Father   . Liver cancer Father   . Hodgkin's lymphoma Father   . CAD Brother   . Diabetes Mellitus II Brother   . Anesthesia problems Neg Hx   . Hypotension Neg Hx   . Malignant hyperthermia Neg Hx   . Pseudochol deficiency Neg Hx   . Colon cancer Neg Hx     Social History   Socioeconomic History  . Marital status: Married    Spouse name: Not on file  . Number of children: Not on file  .  Years of education: college  . Highest education level: Not on file  Occupational History  . Occupation: Maintenance tech    Employer: [employer]  Tobacco Use  . Smoking status: Never Smoker  . Smokeless tobacco: Never Used  Substance and Sexual Activity  . Alcohol use: No    Alcohol/week: 0.0 standard drinks  . Drug use: No  . Sexual activity: Not on file  Other Topics Concern  . Not on file  Social History Narrative  . Not on file   Social Determinants of Health   Financial Resource Strain:   . Difficulty of Paying Living Expenses:   Food Insecurity:   . Worried About Charity fundraiser in the Last Year:   . Arboriculturist in the Last Year:   Transportation Needs:   . Film/video editor (Medical):   [name] Lack of Transportation (Non-Medical):   Physical Activity:   . Days of Exercise per Week:   . Minutes of Exercise per Session:   Stress:   . Feeling of Stress :   Social Connections:   . Frequency of Communication with Friends and Family:   . Frequency of Social Gatherings with Friends and Family:   . Attends Religious Services:   . Active Member of  Clubs or Organizations:   . Attends Archivist Meetings:   [name] Marital Status:   Intimate Partner Violence:   . Fear of Current or Ex-Partner:   . Emotionally Abused:   [name] Physically Abused:   . Sexually Abused:     Review of Systems: See HPI, otherwise negative ROS  Physical Exam: BP 109/61   Pulse 74   Temp 97.8 F (36.6 C) (Oral)   Resp 18   Ht 5\' 10"  (1.778 m)   Wt 117 kg   SpO2 97%   BMI 37.01 kg/m  General:   Alert,  Well-developed, well-nourished, pleasant and cooperative in NAD Neck:  Supple; no masses or thyromegaly. No significant cervical adenopathy. Lungs:  Clear throughout to auscultation.   No wheezes, crackles, or rhonchi. No acute distress. Heart:  Regular rate and rhythm; no murmurs, clicks, rubs,  or gallops. Abdomen: Non-distended, normal bowel sounds.  Soft and nontender without appreciable mass or hepatosplenomegaly.  Pulses:  Normal pulses noted. Extremities:  Without clubbing or edema.  Impression/Plan: 59 year old [place] with a multiple colonic adenomas removed from his colon the setting of inadequate preparation.  Remainder of colonic mucosa not seen well.  He is here today for a repeat examination.  He feels his prep is much better today.  Therefore, we are proceeding with a repeat screening colonoscopy.  The risks, benefits, limitations, alternatives and imponderables have been reviewed with the patient. Questions have been answered. All parties are agreeable.      Notice: This dictation was prepared with Dragon dictation along with smaller phrase technology. Any transcriptional errors that result from this process are unintentional and may not be corrected upon review.

## 2020-03-17 NOTE — Anesthesia Postprocedure Evaluation (Signed)
Anesthesia Post Note  Patient: [name]  Procedure(s) Performed: COLONOSCOPY WITH PROPOFOL (N/A )  Patient location during evaluation: Endoscopy Anesthesia Type: General Level of consciousness: awake and alert Pain management: pain level controlled Vital Signs Assessment: post-procedure vital signs reviewed and stable Respiratory status: spontaneous breathing, nonlabored ventilation, respiratory function stable and patient connected to nasal cannula oxygen Cardiovascular status: blood pressure returned to baseline and stable Postop Assessment: no apparent nausea or vomiting Anesthetic complications: no     Last Vitals:  Vitals:   03/17/20 1030 03/17/20 1045  BP: (!) 88/50 100/65  Pulse: 78 71  Resp: 16 20  Temp:    SpO2: 93%     Last Pain:  Vitals:   03/17/20 1014  TempSrc:   PainSc: 0-No pain                 Talitha Givens

## 2020-03-17 NOTE — Op Note (Signed)
[facility] Patient Name: [name] Procedure Date: 03/17/2020 9:32 AM MRN: [id number] Date of Birth: November 21, 1961 Attending MD: Norvel Richards , MD CSN: [id number] Age: 59 Admit Type: Outpatient Procedure:                Colonoscopy Indications:              High risk colon cancer surveillance: Personal                            history of colonic polyps; inadequate preparation                            December 2020. Providers:                Norvel Richards, MD, Jeanann Lewandowsky. Sharon Seller, RN,                            Raphael Gibney, Technician Referring MD:             Halford Chessman MD, MD Medicines:                Propofol per Anesthesia Complications:            No immediate complications. Estimated Blood Loss:     Estimated blood loss: none. Procedure:                Pre-Anesthesia Assessment:                           - Prior to the procedure, a History and Physical                            was performed, and patient medications and                            allergies were reviewed. The patient's tolerance of                            previous anesthesia was also reviewed. The risks                            and benefits of the procedure and the sedation                            options and risks were discussed with the patient.                            All questions were answered, and informed consent                            was obtained. Prior Anticoagulants: The patient has                            taken no previous anticoagulant or antiplatelet  agents. ASA Grade Assessment: II - A patient with                            mild systemic disease. After reviewing the risks                            and benefits, the patient was deemed in                            satisfactory condition to undergo the procedure.                           After obtaining informed consent, the colonoscope                            was  passed under direct vision. Throughout the                            procedure, the patient's blood pressure, pulse, and                            oxygen saturations were monitored continuously. The                            CF-HQ190L ([id number]) scope was introduced through                            the anus and advanced to the the cecum, identified                            by appendiceal orifice and ileocecal valve. The                            colonoscopy was performed without difficulty. The                            patient tolerated the procedure well. The quality                            of the bowel preparation was inadequate. Scope In: 9:54:19 AM Scope Out: 10:07:51 AM Scope Withdrawal Time: 0 hours 6 minutes 43 seconds  Total Procedure Duration: 0 hours 13 minutes 32 seconds  Findings:      The perianal and digital rectal examinations were normal. Scope advanced       to the cecum. Marginal prep in the rectum and descending. Prep appeared       progressively worse as the ascending segment was reached. In the       ascending segment. Was quite a bit of viscous formed semiformed stool       pretty covering and obscuring the vast majority of the colonic mucosa in       this segment. Appeared worse than previously seen. This was a grossly       inadequate examination. A significant lesion could have been obscured       and consequently  not seen on today's examination. He did appear to have       rectal varices. Impression:               - Preparation of the colon was inadequate. Much of                            the colon could not be seen today. Rectal varices.                           - No specimens collected. Moderate Sedation:      Moderate (conscious) sedation was personally administered by an       anesthesia professional. The following parameters were monitored: oxygen       saturation, heart rate, blood pressure, respiratory rate, EKG, adequacy       of  pulmonary ventilation, and response to care. Recommendation:           - Patient has a contact number available for                            emergencies. The signs and symptoms of potential                            delayed complications were discussed with the                            patient. Return to normal activities tomorrow.                            Written discharge instructions were provided to the                            patient.                           - Advance diet as tolerated.                           - Patient has a contact number available for                            emergencies. The signs and symptoms of potential                            delayed complications were discussed with the                            patient. Return to normal activities tomorrow.                            Written discharge instructions were provided to the                            patient. Repeat colonoscopy recommended?"details to  be determined. Procedure Code(s):        --- Professional ---                           [id number], Colonoscopy, flexible; diagnostic, including                            collection of specimen(s) by brushing or washing,                            when performed (separate procedure) Diagnosis Code(s):        --- Professional ---                           Z86.010, Personal history of colonic polyps CPT copyright 2019 American Medical Association. All rights reserved. The codes documented in this report are preliminary and upon coder review may  be revised to meet current compliance requirements. [name]. Elinore Shults, MD Norvel Richards, MD 03/17/2020 10:25:08 AM This report has been signed electronically. Number of Addenda: 0

## 2020-03-17 NOTE — Anesthesia Preprocedure Evaluation (Signed)
Anesthesia Evaluation  Patient identified by MRN, date of birth, ID band Patient awake    Reviewed: Allergy & Precautions, NPO status , Patient's Chart, lab work & pertinent test results, reviewed documented beta blocker date and time   Airway Mallampati: III  TM Distance: >3 FB Neck ROM: Full    Dental  (+) Missing, Dental Advisory Given   Pulmonary sleep apnea and Continuous Positive Airway Pressure Ventilation ,    Pulmonary exam normal breath sounds clear to auscultation       Cardiovascular Exercise Tolerance: Good METS: 3 - Mets hypertension, Pt. on medications (-) angina+ CAD, + Past MI and + CABG  Normal cardiovascular exam Rhythm:Regular Rate:Normal  History:   PMH:  Acquired from the patient and from the patient&'s chart.  PMH:  OSA on CPAP. Non-ST elevation MI (NSTEMI) Non-ST elevation MI (NSTEMI)  Risk factors:  Hypertension. Diabetes mellitus. Dyslipidemia.  ------------------------------------------------------------------- Study Conclusions  - Left ventricle: The cavity size was normal. Wall thickness was   increased in a pattern of mild LVH. Systolic function was normal.   The estimated ejection fraction was in the range of 55% to 60%.   Wall motion was normal; there were no regional wall motion   abnormalities. Left ventricular diastolic function parameters   were normal for the patient&'s age. - Aortic valve: Moderately calcified annulus. Trileaflet; mildly   calcified leaflets. There was mild stenosis. Mean gradient (S):   10 mm Hg. Peak gradient (S): 18 mm Hg. Valve area (VTI): 1.68   cm^2. - Mitral valve: Mildly calcified annulus. There was trivial   regurgitation. - Right ventricle: Systolic function was low normal. - Right atrium: Central venous pressure (est): 3 mm Hg. - Atrial septum: No defect or patent foramen ovale was identified. - Tricuspid valve: There was trivial regurgitation. - Pulmonary  arteries: PA peak pressure: 9 mm Hg (S). - Pericardium, extracardiac: There was no pericardial effusion.    Neuro/Psych    GI/Hepatic GERD  Medicated and Controlled,  Endo/Other  diabetes, Well Controlled, Type 2, Insulin Dependent, Oral Hypoglycemic Agents  Renal/GU Renal InsufficiencyRenal disease     Musculoskeletal  (+) Arthritis ,   Abdominal   Peds  Hematology  (+) anemia ,   Anesthesia Other Findings   Reproductive/Obstetrics                             Anesthesia Physical  Anesthesia Plan  ASA: III  Anesthesia Plan: General   Post-op Pain Management:    Induction: Intravenous  PONV Risk Score and Plan: 0 and TIVA  Airway Management Planned: Nasal Cannula, Natural Airway and Simple Face Mask  Additional Equipment:   Intra-op Plan:   Post-operative Plan:   Informed Consent: I have reviewed the patients History and Physical, chart, labs and discussed the procedure including the risks, benefits and alternatives for the proposed anesthesia with the patient or authorized representative who has indicated his/her understanding and acceptance.     Dental advisory given  Plan Discussed with: CRNA and Surgeon  Anesthesia Plan Comments:         Anesthesia Quick Evaluation

## 2020-03-17 NOTE — Discharge Instructions (Signed)
Colonoscopy Discharge Instructions  Read the instructions outlined below and refer to this sheet in the next few weeks. These discharge instructions provide you with general information on caring for yourself after you leave the hospital. Your doctor may also give you specific instructions. While your treatment has been planned according to the most current medical practices available, unavoidable complications occasionally occur. If you have any problems or questions after discharge, call Dr. Gala Romney at [phone]. ACTIVITY  You may resume your regular activity, but move at a slower pace for the next 24 hours.   Take frequent rest periods for the next 24 hours.   Walking will help get rid of the air and reduce the bloated feeling in your belly (abdomen).   No driving for 24 hours (because of the medicine (anesthesia) used during the test).    Do not sign any important legal documents or operate any machinery for 24 hours (because of the anesthesia used during the test).  NUTRITION  Drink plenty of fluids.   You may resume your normal diet as instructed by your doctor.   Begin with a light meal and progress to your normal diet. Heavy or fried foods are harder to digest and may make you feel sick to your stomach (nauseated).   Avoid alcoholic beverages for 24 hours or as instructed.  MEDICATIONS  You may resume your normal medications unless your doctor tells you otherwise.  WHAT YOU CAN EXPECT TODAY  Some feelings of bloating in the abdomen.   Passage of more gas than usual.   Spotting of blood in your stool or on the toilet paper.  IF YOU HAD POLYPS REMOVED DURING THE COLONOSCOPY:  No aspirin products for 7 days or as instructed.   No alcohol for 7 days or as instructed.   Eat a soft diet for the next 24 hours.  FINDING OUT THE RESULTS OF YOUR TEST Not all test results are available during your visit. If your test results are not back during the visit, make an appointment  with your caregiver to find out the results. Do not assume everything is normal if you have not heard from your caregiver or the medical facility. It is important for you to follow up on all of your test results.  SEEK IMMEDIATE MEDICAL ATTENTION IF:  You have more than a spotting of blood in your stool.   Your belly is swollen (abdominal distention).   You are nauseated or vomiting.   You have a temperature over 101.   You have abdominal pain or discomfort that is severe or gets worse throughout the day.    Your colonoscopy prep was very poor today.  Probably WORSE than seen at the prior attempt.    Further recommendations to follow.  At patient request, I spoke to brother, Laney Bagshaw at [phone] and reviewed results     Monitored Anesthesia Care, Care After These instructions provide you with information about caring for yourself after your procedure. Your health care provider may also give you more specific instructions. Your treatment has been planned according to current medical practices, but problems sometimes occur. Call your health care provider if you have any problems or questions after your procedure. What can I expect after the procedure? After your procedure, you may:  Feel sleepy for several hours.  Feel clumsy and have poor balance for several hours.  Feel forgetful about what happened after the procedure.  Have poor judgment for several hours.  Feel nauseous or vomit.  Have a sore throat if you had a breathing tube during the procedure. Follow these instructions at home: For at least 24 hours after the procedure:      Have a responsible adult stay with you. It is important to have someone help care for you until you are awake and alert.  Rest as needed.  Do not: ? Participate in activities in which you could fall or become injured. ? Drive. ? Use heavy machinery. ? Drink alcohol. ? Take sleeping pills or medicines that cause  drowsiness. ? Make important decisions or sign legal documents. ? Take care of children on your own. Eating and drinking  Follow the diet that is recommended by your health care provider.  If you vomit, drink water, juice, or soup when you can drink without vomiting.  Make sure you have little or no nausea before eating solid foods. General instructions  Take over-the-counter and prescription medicines only as told by your health care provider.  If you have sleep apnea, surgery and certain medicines can increase your risk for breathing problems. Follow instructions from your health care provider about wearing your sleep device: ? Anytime you are sleeping, including during daytime naps. ? While taking prescription pain medicines, sleeping medicines, or medicines that make you drowsy.  If you smoke, do not smoke without supervision.  Keep all follow-up visits as told by your health care provider. This is important. Contact a health care provider if:  You keep feeling nauseous or you keep vomiting.  You feel light-headed.  You develop a rash.  You have a fever. Get help right away if:  You have trouble breathing. Summary  For several hours after your procedure, you may feel sleepy and have poor judgment.  Have a responsible adult stay with you for at least 24 hours or until you are awake and alert. This information is not intended to replace advice given to you by your health care provider. Make sure you discuss any questions you have with your health care provider. Document Revised: 02/27/2018 Document Reviewed: 03/21/2016 Elsevier Patient Education  [place].

## 2020-03-17 NOTE — Addendum Note (Signed)
Addendum  created 03/17/20 1214 by Lyda Jester, CRNA   Charge Capture section accepted

## 2020-03-17 NOTE — Telephone Encounter (Addendum)
Sent over referral.  Left message for him to advise

## 2020-03-17 NOTE — Transfer of Care (Signed)
Immediate Anesthesia Transfer of Care Note  Patient: [name]  Procedure(s) Performed: COLONOSCOPY WITH PROPOFOL (N/A )  Patient Location: PACU  Anesthesia Type:General  Level of Consciousness: awake, alert  and oriented  Airway & Oxygen Therapy: Patient Spontanous Breathing  Post-op Assessment: Report given to RN, Post -op Vital signs reviewed and stable and Patient moving all extremities X 4  Post vital signs: Reviewed and stable  Last Vitals:  Vitals Value Taken Time  BP 86/58 03/17/20 1012  Temp    Pulse 72 03/17/20 1014  Resp 18 03/17/20 1014  SpO2 100 % 03/17/20 1014  Vitals shown include unvalidated device data.  Last Pain:  Vitals:   03/17/20 0948  TempSrc:   PainSc: 0-No pain         Complications: No apparent anesthesia complications

## 2020-03-17 NOTE — Addendum Note (Signed)
Addendum  created 03/17/20 1222 by Lyda Jester, CRNA   Intraprocedure Staff edited

## 2020-03-17 NOTE — Telephone Encounter (Signed)
Patient called, left message.  He was referred from Dr Luna Glasgow to Dr Aline Brochure for surgery.  He states that he is still to see the doctor at [facility] about his gall bladder, but that his cardiologist says go ahead and have Dr Aline Brochure refer him to the hematologist to see what is going on with his platelets.  Please call him to advise on this.

## 2020-03-23 ENCOUNTER — Telehealth: Payer: Self-pay | Admitting: Gastroenterology

## 2020-03-23 NOTE — Telephone Encounter (Signed)
Reviewed [facility] OV Note regarding patients gallbladder polyp. Patient has been referred to [facility] for a second opinion to weigh risk and benefits of cholecystectomy versus surveillance of gallbladder polyp. Although patient is child pugh A, he has varices and splenomegaly which increases perioperative risk of mortality and Dr. Redmond Pulling felt patient needed to be evaluated by a surgical program that does liver transplant surgery.

## 2020-03-25 ENCOUNTER — Other Ambulatory Visit: Payer: Self-pay

## 2020-03-25 ENCOUNTER — Encounter (HOSPITAL_COMMUNITY): Payer: Self-pay

## 2020-03-26 ENCOUNTER — Inpatient Hospital Stay (HOSPITAL_COMMUNITY): Payer: Federal, State, Local not specified - PPO | Attending: Hematology | Admitting: Hematology

## 2020-03-26 ENCOUNTER — Encounter (HOSPITAL_COMMUNITY): Payer: Self-pay | Admitting: Hematology

## 2020-03-26 ENCOUNTER — Inpatient Hospital Stay (HOSPITAL_COMMUNITY): Payer: Federal, State, Local not specified - PPO

## 2020-03-26 VITALS — BP 112/68 | HR 96 | Temp 97.5°F | Resp 18 | Ht 70.0 in | Wt 261.0 lb

## 2020-03-26 DIAGNOSIS — I129 Hypertensive chronic kidney disease with stage 1 through stage 4 chronic kidney disease, or unspecified chronic kidney disease: Secondary | ICD-10-CM | POA: Diagnosis not present

## 2020-03-26 DIAGNOSIS — Z79899 Other long term (current) drug therapy: Secondary | ICD-10-CM | POA: Insufficient documentation

## 2020-03-26 DIAGNOSIS — K7469 Other cirrhosis of liver: Secondary | ICD-10-CM | POA: Diagnosis not present

## 2020-03-26 DIAGNOSIS — E119 Type 2 diabetes mellitus without complications: Secondary | ICD-10-CM | POA: Insufficient documentation

## 2020-03-26 DIAGNOSIS — D649 Anemia, unspecified: Secondary | ICD-10-CM | POA: Diagnosis not present

## 2020-03-26 DIAGNOSIS — E785 Hyperlipidemia, unspecified: Secondary | ICD-10-CM | POA: Diagnosis not present

## 2020-03-26 DIAGNOSIS — G4730 Sleep apnea, unspecified: Secondary | ICD-10-CM | POA: Diagnosis not present

## 2020-03-26 DIAGNOSIS — I2510 Atherosclerotic heart disease of native coronary artery without angina pectoris: Secondary | ICD-10-CM | POA: Diagnosis not present

## 2020-03-26 DIAGNOSIS — Z8616 Personal history of COVID-19: Secondary | ICD-10-CM | POA: Insufficient documentation

## 2020-03-26 DIAGNOSIS — N183 Chronic kidney disease, stage 3 unspecified: Secondary | ICD-10-CM | POA: Diagnosis not present

## 2020-03-26 DIAGNOSIS — D696 Thrombocytopenia, unspecified: Secondary | ICD-10-CM | POA: Diagnosis not present

## 2020-03-26 LAB — CBC WITH DIFFERENTIAL/PLATELET
Abs Immature Granulocytes: 0 10*3/uL (ref 0.00–0.07)
Band Neutrophils: 0 %
Basophils Absolute: 0 10*3/uL (ref 0.0–0.1)
Basophils Relative: 0 %
Blasts: 0 %
Eosinophils Absolute: 0.1 10*3/uL (ref 0.0–0.5)
Eosinophils Relative: 2 %
HCT: 35.4 % — ABNORMAL LOW (ref 39.0–52.0)
Hemoglobin: 11.8 g/dL — ABNORMAL LOW (ref 13.0–17.0)
Lymphocytes Relative: 29 %
Lymphs Abs: 1 10*3/uL (ref 0.7–4.0)
MCH: 33.3 pg (ref 26.0–34.0)
MCHC: 33.3 g/dL (ref 30.0–36.0)
MCV: 100 fL (ref 80.0–100.0)
Metamyelocytes Relative: 0 %
Monocytes Absolute: 0.3 10*3/uL (ref 0.1–1.0)
Monocytes Relative: 8 %
Myelocytes: 0 %
Neutro Abs: 1.9 10*3/uL (ref 1.7–7.7)
Neutrophils Relative %: 61 %
Other: 0 %
Platelets: 66 10*3/uL — ABNORMAL LOW (ref 150–400)
Promyelocytes Relative: 0 %
RBC: 3.54 MIL/uL — ABNORMAL LOW (ref 4.22–5.81)
RDW: 14.3 % (ref 11.5–15.5)
WBC: 3.3 10*3/uL — ABNORMAL LOW (ref 4.0–10.5)
nRBC: 0 % (ref 0.0–0.2)
nRBC: 0 /100 WBC

## 2020-03-26 LAB — LACTATE DEHYDROGENASE: LDH: 131 U/L (ref 98–192)

## 2020-03-26 LAB — RETICULOCYTES
Immature Retic Fract: 12 % (ref 2.3–15.9)
RBC.: 3.41 MIL/uL — ABNORMAL LOW (ref 4.22–5.81)
Retic Count, Absolute: 46 10*3/uL (ref 19.0–186.0)
Retic Ct Pct: 1.4 % (ref 0.4–3.1)

## 2020-03-26 LAB — DIRECT ANTIGLOBULIN TEST (NOT AT ARMC)
DAT, IgG: NEGATIVE
DAT, complement: NEGATIVE

## 2020-03-26 LAB — FERRITIN: Ferritin: 63 ng/mL (ref 24–336)

## 2020-03-26 LAB — FOLATE: Folate: 19.3 ng/mL (ref >5.9)

## 2020-03-26 LAB — IRON AND TIBC
Iron: 72 ug/dL (ref 45–182)
Saturation Ratios: 22 % (ref 17.9–39.5)
TIBC: 330 ug/dL (ref 250–450)
UIBC: 258 ug/dL

## 2020-03-26 LAB — VITAMIN D 25 HYDROXY (VIT D DEFICIENCY, FRACTURES): Vit D, 25-Hydroxy: 92.38 ng/mL (ref 30–100)

## 2020-03-26 LAB — SAVE SMEAR(SSMR), FOR PROVIDER SLIDE REVIEW

## 2020-03-26 LAB — VITAMIN B12: Vitamin B-12: 743 pg/mL (ref 180–914)

## 2020-03-26 NOTE — Patient Instructions (Signed)
[facility] Discharge Instructions  Follow up in 2-3 weeks   Thank you for choosing [facility] at [facility] to provide your oncology and hematology care.  To afford each patient quality time with our provider, please arrive at least 15 minutes before your scheduled appointment time.   If you have a lab appointment with the Cancer Center please come in thru the Main Entrance and check in at the main information desk.  You need to re-schedule your appointment should you arrive 10 or more minutes late.  We strive to give you quality time with our providers, and arriving late affects you and other patients whose appointments are after yours.  Also, if you no show three or more times for appointments you may be dismissed from the clinic at the providers discretion.     Again, thank you for choosing [facility].  Our hope is that these requests will decrease the amount of time that you wait before being seen by our physicians.       _____________________________________________________________  Should you have questions after your visit to [facility], please contact our office at [phone] between the hours of 8:00 a.m. and 4:30 p.m.  Voicemails left after 4:00 p.m. will not be returned until the following business day.  For prescription refill requests, have your pharmacy contact our office and allow 72 hours.    Due to Covid, you will need to wear a mask upon entering the hospital. If you do not have a mask, a mask will be given to you at the Main Entrance upon arrival. For doctor visits, patients may have 1 support person with them. For treatment visits, patients can not have anyone with them due to social distancing guidelines and our immunocompromised population.      

## 2020-03-26 NOTE — Progress Notes (Signed)
CONSULT NOTE  Patient Care Team: [name], MD as PCP - General (Family Medicine) Satira Sark, MD as PCP - Cardiology (Cardiology) Gala Romney [name], MD as Consulting Physician (Gastroenterology)  CHIEF COMPLAINTS/PURPOSE OF CONSULTATION: Anemia and thrombocytopenia.  HISTORY OF PRESENTING ILLNESS:  [name] 59 y.o. male is sent here by his PCP for anemia and thrombocytopenia.  Patient reports he is known about his anemia and thrombocytopenia for some time now.  He reports he received a full work-up at [facility] where he also received 2 bone marrow biopsies.  He reports he is being sent again due to worsening of his thrombocytopenia.  He also reports he is being sent to [facility] for gallbladder removal and work-up for liver transplant.  He was diagnosed with cirrhosis of the liver.  He has had a recent colonoscopy 2 weeks ago for continued work-up of his anemia.  They found rectal varices but no source of bleeding.  He is also on oral iron supplements with no side effects.  He does have a history of CKD.  He also has a history of obstructive sleep apnea where he sleeps with the BiPAP.  He denies history of ever needing a blood transfusion.  He denies any recent bleeding such as epistasis, hematuria or hematochezia.  He denies any alcohol, smoking or any illicit drugs.  He denies any form of pica.  He reports the only time he has had steroids was for his Covid pneumonia back in February 2021.  He denies any B symptoms including night sweats, chills, fevers or unexplained weight loss.  He denies any enlarged spleen.  He had a CT back in 2017 which showed normal-sized spleen.  He denies any recent chest pain on exertion, shortness of breath on minimal exertion, presyncopal episodes or palpitations.  He denies any over-the-counter NSAID ingestion.  He has had no prior history or diagnosis of cancer.  He has a family history of a dad with liver cancer and a maternal grandfather  with lung cancer.    MEDICAL HISTORY:  Past Medical History:  Diagnosis Date  . Anemia   . Arthritis   . CAD (coronary artery disease)    Multivessel disease status post CABG 08/2015  . CKD (chronic kidney disease) stage 3, GFR 30-59 ml/min   . Essential hypertension   . Hyperlipidemia   . OSA on CPAP   . Polyclonal gammopathy   . Thrombocytopenia ([place]) 2016  . Type 2 diabetes mellitus ([place])     SURGICAL HISTORY: Past Surgical History:  Procedure Laterality Date  . APPENDECTOMY    . Biceps tendon surgery Right   . BIOPSY  11/22/2019   Procedure: BIOPSY;  Surgeon: Daneil Dolin, MD;  [facility];  Service: Endoscopy;;  . CARDIAC CATHETERIZATION N/A 08/25/2015   Procedure: Left Heart Cath and Coronary Angiography;  Surgeon: Belva Crome, MD;  Location: [place] CV LAB;  Service: Cardiovascular;  Laterality: N/A;  . COLONOSCOPY WITH PROPOFOL N/A 11/22/2019   Procedure: COLONOSCOPY WITH PROPOFOL;  Surgeon: Daneil Dolin, MD; Four 4-5 mm polyps, findings suggestive of portal colopathy, congested appearing colonic mucosa diffusely, rectal varices, and adequate right colon prep.  Pathology with tubular adenomas and hyperplastic polyp.  Right colon biopsy with focal active colitis.  Recommendations to repeat colonoscopy in 3 months due to poor prep.  . COLONOSCOPY WITH PROPOFOL N/A 03/17/2020   Procedure: COLONOSCOPY WITH PROPOFOL;  Surgeon: Daneil Dolin, MD;  Location[facility];  Service: Endoscopy;  Laterality: N/A;  8:45am - pt does not need covid test, was + 2/4 <90 days  . CORONARY ARTERY BYPASS GRAFT N/A 08/29/2015   Procedure: CORONARY ARTERY BYPASS GRAFTING (CABG);  Surgeon: [name], MD;  Location: [place];  Service: Open Heart Surgery;  Laterality: N/A;  . ESOPHAGOGASTRODUODENOSCOPY (EGD) WITH PROPOFOL N/A 11/22/2019   Procedure: ESOPHAGOGASTRODUODENOSCOPY (EGD) WITH PROPOFOL;  Surgeon: Daneil Dolin, MD; 4 columns of grade 2-3 esophageal  varices, portal gastropathy, gastric polyp/abnormal gastric mucosa s/p biopsy.  Pathology with hyperplastic polyp, mild chronic gastritis, negative H. pylori.  [name] KNEE ARTHROSCOPY Left   . TEE WITHOUT CARDIOVERSION N/A 08/29/2015   Procedure: TRANSESOPHAGEAL ECHOCARDIOGRAM (TEE);  Surgeon: [name], MD;  Location: [place];  Service: Open Heart Surgery;  Laterality: N/A;  . TOTAL KNEE ARTHROPLASTY Left 03/09/2017   Procedure: TOTAL KNEE ARTHROPLASTY;  Surgeon: Carole Civil, MD;  Location: [facility] OR[facility];  Service: Orthopedics;  Laterality: Left;    SOCIAL HISTORY: Social History   Socioeconomic History  . Marital status: Married    Spouse name: Not on file  . Number of children: 0  . Years of education: college  . Highest education level: Not on file  Occupational History  . Occupation: Maintenance tech    Employer: [employer]  Tobacco Use  . Smoking status: Never Smoker  . Smokeless tobacco: Never Used  Substance and Sexual Activity  . Alcohol use: No    Alcohol/week: 0.0 standard drinks  . Drug use: No  . Sexual activity: Not Currently  Other Topics Concern  . Not on file  Social History Narrative  . Not on file   Social Determinants of Health   Financial Resource Strain:   . Difficulty of Paying Living Expenses:   Food Insecurity:   . Worried About Charity fundraiser in the Last Year:   . Arboriculturist in the Last Year:   Transportation Needs:   . Film/video editor (Medical):   [name] Lack of Transportation (Non-Medical):   Physical Activity:   . Days of Exercise per Week:   . Minutes of Exercise per Session:   Stress:   . Feeling of Stress :   Social Connections:   . Frequency of Communication with Friends and Family:   . Frequency of Social Gatherings with Friends and Family:   . Attends Religious Services:   . Active Member of Clubs or Organizations:   . Attends Archivist Meetings:   [name] Marital Status:   Intimate Partner Violence:   .  Fear of Current or Ex-Partner:   . Emotionally Abused:   [name] Physically Abused:   . Sexually Abused:     FAMILY HISTORY: Family History  Problem Relation Age of Onset  . Arthritis Other   . Cancer Other   . Diabetes Other   . CAD Father   . Diabetes Mellitus II Father   . Liver cancer Father   . Hodgkin's lymphoma Father   . CAD Brother   . Diabetes Mellitus II Brother   . ALS Mother   . Diabetes Mellitus II Sister   . Diabetes Mellitus II Brother   . Diabetes Mellitus II Maternal Grandmother   . Aneurysm Maternal Grandmother   . Cancer Maternal Grandfather   . Anesthesia problems Neg Hx   . Hypotension Neg Hx   . Malignant hyperthermia Neg Hx   . Pseudochol deficiency Neg Hx   . Colon cancer  Neg Hx     ALLERGIES:  has No Known Allergies.  MEDICATIONS:  Current Outpatient Medications  Medication Sig Dispense Refill  . albuterol (VENTOLIN HFA) 108 (90 Base) MCG/ACT inhaler Inhale 2 puffs into the lungs every 6 (six) hours as needed for wheezing or shortness of breath. (Patient not taking: Reported on 03/25/2020) 18 g 0  . Armodafinil 200 MG TABS Use as needed for sleepiness, by mouth. 1 tab  before 12 noon. (Patient not taking: Reported on 03/25/2020) 30 tablet 5  . aspirin EC 81 MG tablet Take 81 mg by mouth at bedtime.     [name] b complex vitamins tablet Take 1 tablet by mouth daily.    [name] Bioflavonoid Products (ESTER C PO) Take 1 capsule by mouth daily.    [name] bismuth subsalicylate (PEPTO BISMOL) 262 MG/15ML suspension Take 30 mLs by mouth every 6 (six) hours as needed.    . Cholecalciferol (DIALYVITE VITAMIN D 5000) 125 MCG (5000 UT) capsule Take 10,000 Units by mouth daily.    [name] (EUCRISA) 2 % OINT Apply 1 application topically 2 (two) times daily as needed. (Patient not taking: Reported on 03/25/2020) 60 g 5  . dicyclomine (BENTYL) 10 MG capsule Take 1 capsule (10 mg total) by mouth 3 (three) times daily. (Patient not taking: Reported on 03/06/2020) 90 capsule 2  .  diphenhydrAMINE (BENADRYL) 2 % cream Apply 1 application topically 3 (three) times daily as needed for itching.    [name] ELDERBERRY PO Take 1 capsule by mouth daily. With  Vitamin c and zinc    . ferrous sulfate 325 (65 FE) MG tablet Take 325 mg by mouth daily.    . Flaxseed, Linseed, (FLAXSEED OIL PO) Take 1,400 mg by mouth daily.     . furosemide (LASIX) 20 MG tablet Take 1 tablet (20 mg total) by mouth daily. 90 tablet 2  . hydrochlorothiazide (HYDRODIURIL) 25 MG tablet Take 12.5 mg by mouth daily.   3  . insulin aspart (NOVOLOG) 100 UNIT/ML injection Inject 5-30 Units into the skin 3 (three) times daily before meals.     [name] loratadine (CLARITIN) 10 MG tablet Take 10 mg by mouth daily.    . Magnesium 250 MG TABS Take 250 mg by mouth daily.    . Multiple Vitamins-Minerals (MULTIVITAMIN ADULT PO) Take 1 tablet by mouth daily.    . nadolol (CORGARD) 20 MG tablet Take 1 tablet (20 mg total) by mouth daily. 30 tablet 3  . olmesartan (BENICAR) 40 MG tablet Take 40 mg by mouth daily.     [name] omeprazole (PRILOSEC) 40 MG capsule Take 40 mg by mouth at bedtime.  2  . pravastatin (PRAVACHOL) 20 MG tablet TAKE 1 TABLET(20 MG) BY MOUTH AT BEDTIME (Patient taking differently: Take 20 mg by mouth at bedtime. ) 90 tablet 3  . traMADol-acetaminophen (ULTRACET) 37.5-325 MG tablet Take 1 tablet by mouth every 4 (four) hours as needed. (Patient not taking: Reported on 03/25/2020) 60 tablet 5  . TRESIBA FLEXTOUCH 200 UNIT/ML SOPN Inject 40-70 Units into the skin See admin instructions. Inject 70 units into the skin in the morning and 40 units at bedtime    . TURMERIC CURCUMIN PO Take 2,000 mg by mouth daily.      No current facility-administered medications for this visit.    REVIEW OF SYSTEMS:   Constitutional: Denies fevers, chills or abnormal night sweats Respiratory: Denies cough, dyspnea or wheezes Cardiovascular: Denies palpitation, chest discomfort.  Positive for leg swelling. Gastrointestinal:  Denies nausea,  heartburn or change in bowel habits Skin: Denies abnormal skin rashes Lymphatics: Denies new lymphadenopathy or easy bruising Neurological:Denies numbness, tingling or new weaknesses Behavioral/Psych: Mood is stable, no new changes  All other systems were reviewed with the patient and are negative.  PHYSICAL EXAMINATION: ECOG PERFORMANCE STATUS: 1 - Symptomatic but completely ambulatory  Vitals:   03/26/20 1044  BP: 112/68  Pulse: 96  Resp: 18  Temp: (!) 97.5 F (36.4 C)  SpO2: 99%   Filed Weights   03/26/20 1044  Weight: 261 lb (118.4 kg)    GENERAL:alert, no distress and comfortable SKIN: skin color, texture, turgor are normal, no rashes or significant lesions NECK: supple, thyroid normal size, non-tender, without nodularity LYMPH:  no palpable lymphadenopathy in the cervical, axillary or inguinal LUNGS: clear to auscultation and percussion with normal breathing effort HEART: regular rate & rhythm and no murmurs and no lower extremity edema ABDOMEN:abdomen soft, non-tender and normal bowel sounds Musculoskeletal:no cyanosis of digits and no clubbing  PSYCH: alert & oriented x 3 with fluent speech NEURO: no focal motor/sensory deficits  LABORATORY DATA:  I have reviewed the data as listed Recent Results (from the past 2160 hour(s))  CBC w/Diff/Platelet     Status: Abnormal   Collection Time: 01/09/20 12:04 PM  Result Value Ref Range   WBC 4.2 3.8 - 10.8 Thousand/uL   RBC 3.57 (L) 4.20 - 5.80 Million/uL   Hemoglobin 12.4 (L) 13.2 - 17.1 g/dL   HCT 35.1 (L) 38.5 - 50.0 %   MCV 98.3 80.0 - 100.0 fL   MCH 34.7 (H) 27.0 - 33.0 pg   MCHC 35.3 32.0 - 36.0 g/dL   RDW 14.2 11.0 - 15.0 %   Platelets 55 (L) 140 - 400 Thousand/uL   MPV 11.0 7.5 - 12.5 fL   Neutro Abs 2,814 1,500 - 7,800 cells/uL   Lymphs Abs 907 850 - 3,900 cells/uL   Absolute Monocytes 386 200 - 950 cells/uL   Eosinophils Absolute 71 15 - 500 cells/uL   Basophils Absolute 21 0 - 200 cells/uL    Neutrophils Relative % 67 %   Total Lymphocyte 21.6 %   Monocytes Relative 9.2 %   Eosinophils Relative 1.7 %   Basophils Relative 0.5 %   Smear Review      Comment: No platelet clumps seen. Review of the peripheral smear reveals decreased numbers of platelets. Review of peripheral smear confirms automated results.   Hepatic function panel     Status: Abnormal   Collection Time: 01/09/20 12:04 PM  Result Value Ref Range   Total Protein 6.9 6.1 - 8.1 g/dL   Albumin 3.8 3.6 - 5.1 g/dL   Globulin 3.1 1.9 - 3.7 g/dL (calc)   AG Ratio 1.2 1.0 - 2.5 (calc)   Total Bilirubin 1.0 0.2 - 1.2 mg/dL   Bilirubin, Direct 0.3 (H) 0.0 - 0.2 mg/dL   Indirect Bilirubin 0.7 0.2 - 1.2 mg/dL (calc)   Alkaline phosphatase (APISO) 66 35 - 144 U/L   AST 47 (H) 10 - 35 U/L   ALT 20 9 - 46 U/L  BASIC METABOLIC PANEL WITH GFR     Status: Abnormal   Collection Time: 01/09/20 12:04 PM  Result Value Ref Range   Glucose, Bld 239 (H) 65 - 139 mg/dL    Comment: .        Non-fasting reference interval .    BUN 26 (H) 7 - 25 mg/dL   Creat 1.40 (H) 0.70 -  1.25 mg/dL    Comment: For patients >64 years of age, the reference limit for Creatinine is approximately 13% higher for people identified as African-American. .    GFR, Est Non African American 54 (L) > OR = 60 mL/min/1.81m   GFR, Est African American 63 > OR = 60 mL/min/1.783m  BUN/Creatinine Ratio 19 6 - 22 (calc)   Sodium 136 135 - 146 mmol/L   Potassium 4.2 3.5 - 5.3 mmol/L   Chloride 104 98 - 110 mmol/L   CO2 23 20 - 32 mmol/L   Calcium 9.0 8.6 - 10.3 mg/dL  AFP tumor marker     Status: None   Collection Time: 01/09/20 12:04 PM  Result Value Ref Range   AFP-Tumor Marker 2.5 <6.1 ng/mL    Comment: . This test was performed using the Beckman Coulter chemiluminescent method. Values obtained from different assay methods cannot be used interchangeably. AFP levels, regardless of value, should not be interpreted as absolute evidence of the  presence or absence of disease. .   INR/PT     Status: Abnormal   Collection Time: 01/09/20 12:04 PM  Result Value Ref Range   INR 1.1     Comment: Reference Range                     0.9-1.1 Moderate-intensity Warfarin Therapy 2.0-3.0 Higher-intensity Warfarin Therapy   3.0-4.0  .    Prothrombin Time 11.8 (H) 9.0 - 11.5 sec    Comment: For additional information, please refer to http://education.[facility].com/faq/FAQ104 (This link is being provided for informational/ educational purposes only.)   ANA     Status: None   Collection Time: 01/09/20 12:04 PM  Result Value Ref Range   Anti Nuclear Antibody (ANA) NEGATIVE NEGATIVE    Comment: ANA IFA is a first line screen for detecting the presence of up to approximately 150 autoantibodies in various autoimmune diseases. A negative ANA IFA result suggests an ANA-associated autoimmune disease is not present at this time, but is not definitive. If there is high clinical suspicion for Sjogren's syndrome, testing for anti-SS-A/Ro antibody should be considered. Anti-Jo-1 antibody should be considered for clinically suspected inflammatory myopathies. . AC-0: Negative . International Consensus on ANA Patterns ([email or web address]://www.hernandez-[email or web address]/. For additional information, please refer to http://education.[facility].com/faq/FAQ177 (This link is being provided for informational/ educational purposes only.) .   Mitochondrial antibodies     Status: None   Collection Time: 01/09/20 12:04 PM  Result Value Ref Range   Mitochondrial M2 Ab, IgG < OR = 20.0 U    Comment:                 Reference Range                 Negative:  < or = 20.0                 Equivocal: 20.1 - 24.9                 Positive:  > or = 25.0   Anti-smooth muscle antibody, IgG     Status: None   Collection Time: 01/09/20 12:04 PM  Result Value Ref Range   Actin (Smooth Muscle) Antibody (IGG) <20 <20 U    Comment: . Reference Range:     <20 U: Negative >or=20 U: Positive . . Marland Kitchenntibodies recognizing actin are the main component of smooth muscle antibodies associated with auto- immune liver disease. Actin antibodies are found in approximately 75%  of patients with autoimmune hepatitis (AIH) type 1, approximately 65% of patients with autoimmune cholangitis, approximately 30% of patients with primary biliary cirrhosis and approximately 2% of healthy controls. High values are closely correlated with AIH type 1. .   IgG, IgA, IgM     Status: Abnormal   Collection Time: 01/09/20 12:04 PM  Result Value Ref Range   Immunoglobulin A 979 (H) 47 - 310 mg/dL   IgG (Immunoglobin G), Serum 1,277 600 - 1,640 mg/dL   IgM, Serum 122 50 - 300 mg/dL  Ceruloplasmin     Status: None   Collection Time: 01/09/20 12:04 PM  Result Value Ref Range   Ceruloplasmin 31 18 - 36 mg/dL  Alpha-1 antitrypsin phenotype     Status: None   Collection Time: 01/09/20 12:04 PM  Result Value Ref Range   A-1 Antitrypsin, Ser 163 83 - 199 mg/dL   ALPHA-1-ANTITRYPSIN (AAT) PHENOTYPE SEE NOTE     Comment: THIS PATIENT'S ALPHA-1-ANTITRYPSIN PHENOTYPE IS PI*MM. [name] 90% of normal individuals have the MM phenotype, with normal quantitative AAT levels. Many phenotypic patterns have been described, including deficiency states with F, S, Z, or other alleles. As a general estimation, compared to M allele of 100% of normal A-1-Antitrypsin protein, the S allele produces approximately 60% and the Z allele 20%. For example, an MS phenotype would have about 80% of normal A-1-Antitrypsin protein level, a 50% contribution from the M allele and 30% from the S allele. A ZZ phenotype would have about 20% of normal levels, a 10% contribution from each Z gene. The F allele has normal A-1-Antitrypsin levels, but the kinetics of elastase inhibition is not as efficient as an M allele product; F alleles should be considered functionally mildly deficient. Other variants  are identifiable by phenotypic analysis. These include CM, DP, EM, GM, IS, LM, M1M2, M3M3, MP, MT, XX, MY, and M1N. I, P, T and  null alleles are considered deleterious. C, D, E, G, L, M1, M2, M3, X and Y alleles are generally considered normal variants. The MZ-Pratt phenotype is a normal variant; care should be taken to avoid confusion with the deficient MZ phenotype.   Fe+TIBC+Fer     Status: Abnormal   Collection Time: 01/09/20 12:04 PM  Result Value Ref Range   Iron 216 (H) 50 - 180 mcg/dL   TIBC 337 250 - 425 mcg/dL (calc)   %SAT 64 (H) 20 - 48 % (calc)   Ferritin 110 24 - 380 ng/mL  Novel Coronavirus, NAA ([facility])     Status: Abnormal   Collection Time: 01/17/20  8:29 AM   Specimen: Nasopharyngeal(NP) swabs in vial transport medium   NASOPHARYNGE  TESTING  Result Value Ref Range   SARS-CoV-2, NAA Detected (A) Not Detected    Comment: This nucleic acid amplification test was developed and its performance characteristics determined by [employer]. Nucleic acid amplification tests include RT-PCR and TMA. This test has not been FDA cleared or approved. This test has been authorized by FDA under an Emergency Use Authorization (EUA). This test is only authorized for the duration of time the declaration that circumstances exist justifying the authorization of the emergency use of in vitro diagnostic tests for detection of SARS-CoV-2 virus and/or diagnosis of COVID-19 infection under section 564(b)(1) of the Act, 21 U.S.C. 725DGU-4(Q) (1), unless the authorization is terminated or revoked sooner. When diagnostic testing is negative, the possibility of a false negative result should be considered in the context of a patient's recent exposures and  the presence of clinical signs and symptoms consistent with COVID-19. An individual without symptoms of COVID-19 and who is not shedding SARS-CoV-2 virus wo uld expect to have a negative (not detected) result in this assay.    Novel Coronavirus, NAA ([facility])     Status: None   Collection Time: 02/13/20  8:37 AM   Specimen: Nasopharyngeal(NP) swabs in vial transport medium   NASOPHARYNGE  TESTING  Result Value Ref Range   SARS-CoV-2, NAA Comment Not Detected    Comment: We are UNABLE to reliably determine a result for the specimen due to the presence of PCR inhibitor(s) in the specimen submitted.  If clinically indicated, please recollect an additional specimen for testing. This nucleic acid amplification test was developed and its performance characteristics determined by [employer]. Nucleic acid amplification tests include RT-PCR and TMA. This test has not been FDA cleared or approved. This test has been authorized by FDA under an Emergency Use Authorization (EUA). This test is only authorized for the duration of time the declaration that circumstances exist justifying the authorization of the emergency use of in vitro diagnostic tests for detection of SARS-CoV-2 virus and/or diagnosis of COVID-19 infection under section 564(b)(1) of the Act, 21 U.S.C. 267TIW-5(Y) (1), unless the authorization is terminated or revoked sooner. When diagnostic testing is negative, the possibility of a false negative result should be considere d in the context of a patient's recent exposures and the presence of clinical signs and symptoms consistent with COVID-19. An individual without symptoms of COVID-19 and who is not shedding SARS-CoV-2 virus would expect to have a negative (not detected) result in this assay.   CBC with Differential/Platelet     Status: Abnormal   Collection Time: 03/13/20  2:06 PM  Result Value Ref Range   WBC 5.6 4.0 - 10.5 K/uL   RBC 3.59 (L) 4.22 - 5.81 MIL/uL   Hemoglobin 12.3 (L) 13.0 - 17.0 g/dL   HCT 36.8 (L) 39.0 - 52.0 %   MCV 102.5 (H) 80.0 - 100.0 fL   MCH 34.3 (H) 26.0 - 34.0 pg   MCHC 33.4 30.0 - 36.0 g/dL   RDW 15.5 11.5 - 15.5 %   Platelets 83 (L) 150 - 400 K/uL     Comment: PLATELET COUNT CONFIRMED BY SMEAR SPECIMEN CHECKED FOR CLOTS Immature Platelet Fraction may be clinically indicated, consider ordering this additional test [id number]    nRBC 0.0 0.0 - 0.2 %   Neutrophils Relative % 73 %   Neutro Abs 4.0 1.7 - 7.7 K/uL   Lymphocytes Relative 16 %   Lymphs Abs 0.9 0.7 - 4.0 K/uL   Monocytes Relative 9 %   Monocytes Absolute 0.5 0.1 - 1.0 K/uL   Eosinophils Relative 2 %   Eosinophils Absolute 0.1 0.0 - 0.5 K/uL   Basophils Relative 0 %   Basophils Absolute 0.0 0.0 - 0.1 K/uL   Immature Granulocytes 0 %   Abs Immature Granulocytes 0.02 0.00 - 0.07 K/uL    Comment: Performed at [facility], [address], [place], [place] [address]  Basic metabolic panel     Status: Abnormal   Collection Time: 03/13/20  2:06 PM  Result Value Ref Range   Sodium 136 135 - 145 mmol/L   Potassium 3.6 3.5 - 5.1 mmol/L   Chloride 98 98 - 111 mmol/L   CO2 23 22 - 32 mmol/L   Glucose, Bld 346 (H) 70 - 99 mg/dL    Comment: Glucose reference range applies only to  samples taken after fasting for at least 8 hours.   BUN 17 6 - 20 mg/dL   Creatinine, Ser 1.45 (H) 0.61 - 1.24 mg/dL   Calcium 9.4 8.9 - 10.3 mg/dL   GFR calc non Af Amer 52 (L) >60 mL/min   GFR calc Af Amer >60 >60 mL/min   Anion gap 15 5 - 15    Comment: Performed at [facility], [address], [place], [place] [address]  Protime-INR     Status: Abnormal   Collection Time: 03/13/20  2:12 PM  Result Value Ref Range   Prothrombin Time 15.7 (H) 11.4 - 15.2 seconds   INR 1.3 (H) 0.8 - 1.2    Comment: (NOTE) INR goal varies based on device and disease states. Performed at [facility], [address], [place], [place] [address]   Glucose, capillary     Status: None   Collection Time: 03/17/20  8:12 AM  Result Value Ref Range   Glucose-Capillary 89 70 - 99 mg/dL    Comment: Glucose reference range applies only to samples taken after fasting for at least 8 hours.  Glucose, capillary     Status: None    Collection Time: 03/17/20 10:17 AM  Result Value Ref Range   Glucose-Capillary 98 70 - 99 mg/dL    Comment: Glucose reference range applies only to samples taken after fasting for at least 8 hours.  Direct antiglobulin test (not at [facility])     Status: None (Preliminary result)   Collection Time: 03/26/20 12:17 PM  Result Value Ref Range   DAT, complement PENDING    DAT, IgG      NEG Performed at [facility], [address], [place], [place] [address]   Reticulocytes     Status: Abnormal   Collection Time: 03/26/20 12:17 PM  Result Value Ref Range   Retic Ct Pct 1.4 0.4 - 3.1 %   RBC. 3.41 (L) 4.22 - 5.81 MIL/uL   Retic Count, Absolute 46.0 19.0 - 186.0 K/uL   Immature Retic Fract 12.0 2.3 - 15.9 %    Comment: Performed at [facility], [address] [place], [place] [id number]  CBC with Differential/Platelet     Status: Abnormal (Preliminary result)   Collection Time: 03/26/20 12:17 PM  Result Value Ref Range   WBC 3.3 (L) 4.0 - 10.5 K/uL   RBC 3.54 (L) 4.22 - 5.81 MIL/uL   Hemoglobin 11.8 (L) 13.0 - 17.0 g/dL   HCT 35.4 (L) 39.0 - 52.0 %   MCV 100.0 80.0 - 100.0 fL   MCH 33.3 26.0 - 34.0 pg   MCHC 33.3 30.0 - 36.0 g/dL   RDW 14.3 11.5 - 15.5 %   Platelets 66 (L) 150 - 400 K/uL    Comment: SPECIMEN CHECKED FOR CLOTS CONSISTENT WITH PREVIOUS RESULT Immature Platelet Fraction may be clinically indicated, consider ordering this additional test [id number]    nRBC 0.0 0.0 - 0.2 %    Comment: Performed at [facility], [address], [place], [place] [address]   Neutrophils Relative % PENDING %   Neutro Abs PENDING 1.7 - 7.7 K/uL   Band Neutrophils PENDING %   Lymphocytes Relative PENDING %   Lymphs Abs PENDING 0.7 - 4.0 K/uL   Monocytes Relative PENDING %   Monocytes Absolute PENDING 0.1 - 1.0 K/uL   Eosinophils Relative PENDING %   Eosinophils Absolute PENDING 0.0 - 0.5 K/uL   Basophils Relative PENDING %   Basophils Absolute PENDING 0.0 -  0.1 K/uL   WBC Morphology  PENDING    RBC Morphology PENDING    Smear Review PENDING    Other PENDING %   nRBC PENDING 0 /100 WBC   Metamyelocytes Relative PENDING %   Myelocytes PENDING %   Promyelocytes Relative PENDING %   Blasts PENDING %   Immature Granulocytes PENDING %   Abs Immature Granulocytes PENDING 0.00 - 0.07 K/uL  Lactate dehydrogenase     Status: None   Collection Time: 03/26/20 12:17 PM  Result Value Ref Range   LDH 131 98 - 192 U/L    Comment: Performed at [facility], [address] [place],  [address]    RADIOGRAPHIC STUDIES: I have personally reviewed the radiological images as listed and agreed with the findings in the report. DG Knee AP/LAT W/Sunrise Right  Result Date: 03/12/2020 DG Knee AP/LAT W/Sunrise RightAccession #[id number] Priority: Routine  Exam Info Dept  Ordering Info Dept 03/11/2020 3:05 PM [place] [phone]  03/11/2020 2:59 PM [place] [phone] Technologist Ordered Procedure  Ordering Provider Loman Chroman, AMY W DG Knee AP/LAT W/Sunrise Right  Carole Civil, MD [phone] Not Available Order Comments None  Painful right knee preop assessment Patient's knee is in a moderate amount of varus 9 degrees with extensive narrowing of the medial compartment and minimal secondary bone changes Impression moderate varus deformity 9 degrees severe arthritis medial compartment minimal secondary bone changes  I have independently interviewed this patient and examined him.  I agree with the HPI written by my nurse practitioner Wenda Low, FNP.  I have independently formulated the assessment and plan.  ASSESSMENT & PLAN:  Thrombocytopenia ([place]) 1.  Thrombocytopenia: -He has thrombocytopenia since 2016.  Platelet count varying between 55-90 since 2018.  Prior to that it was mildly low. -He denies any easy bruising or bleeding.  He is planning to have knee replacement surgery by Dr. Aline Brochure. -Bone marrow biopsy on 01/26/2019  showed slightly hypercellular marrow for age with trilineage hematopoiesis.  No significant dyspoiesis or increase in blast cells.  Plasma cells are slightly increased in number but with polyclonal staining pattern for kappa and lambda light chains.  Abundant megakaryocytes with scattered small hypolobated forms.  Chromosome analysis was normal.  Bone marrow biopsy was done previously for work-up of increased IgA levels. -Ultrasound of the abdomen on 01/15/2020 showed nodular contour of the liver consistent with cirrhosis.  Splenomegaly with a volume of 1562 mL. -Today we will repeat his platelet count with a sodium citrate tube.  We will check for nutritional deficiencies and infectious causes.  We will also check for connective tissue disorders. 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  We will talk to him about lab results and further options at next visit.  2.  Normocytic to macrocytic anemia: -Likely from combination of CKD and liver disease.  Has CKD since 2016. -Colonoscopy on 03/17/2020 showed inadequate preparation of the colon with rectal varices.  Much of the colon could not be seen. -We will check for nutritional deficiencies including ferritin, iron panel, L38 and folic acid.  We will also check for hemolysis and monoclonal gammopathy.     All questions were answered. The patient knows to call the clinic with any problems, questions or concerns.      Derek Jack, MD 03/26/20 1:34 PM

## 2020-03-26 NOTE — Assessment & Plan Note (Addendum)
1.  Thrombocytopenia: -He has thrombocytopenia since 2016.  Platelet count varying between 55-90 since 2018.  Prior to that it was mildly low. -He denies any easy bruising or bleeding.  He is planning to have knee replacement surgery by Dr. Aline Brochure. -Bone marrow biopsy on 01/26/2019 showed slightly hypercellular marrow for age with trilineage hematopoiesis.  No significant dyspoiesis or increase in blast cells.  Plasma cells are slightly increased in number but with polyclonal staining pattern for kappa and lambda light chains.  Abundant megakaryocytes with scattered small hypolobated forms.  Chromosome analysis was normal.  Bone marrow biopsy was done previously for work-up of increased IgA levels. -Ultrasound of the abdomen on 01/15/2020 showed nodular contour of the liver consistent with cirrhosis.  Splenomegaly with a volume of 1562 mL. -Today we will repeat his platelet count with a sodium citrate tube.  We will check for nutritional deficiencies and infectious causes.  We will also check for connective tissue disorders. 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  We will talk to him about lab results and further options at next visit.  2.  Normocytic to macrocytic anemia: -Likely from combination of CKD and liver disease.  Has CKD since 2016. -Colonoscopy on 03/17/2020 showed inadequate preparation of the colon with rectal varices.  Much of the colon could not be seen. -We will check for nutritional deficiencies including ferritin, iron panel, B12 and folic acid.  We will also check for hemolysis and monoclonal gammopathy.

## 2020-03-27 LAB — HAPTOGLOBIN: Haptoglobin: 53 mg/dL (ref 29–370)

## 2020-03-27 LAB — RHEUMATOID FACTOR: Rheumatoid fact SerPl-aCnc: 58.6 IU/mL — ABNORMAL HIGH (ref 0.0–13.9)

## 2020-03-27 LAB — ANTINUCLEAR ANTIBODIES, IFA: ANA Ab, IFA: NEGATIVE

## 2020-03-28 LAB — PROTEIN ELECTROPHORESIS, SERUM
A/G Ratio: 1 (ref 0.7–1.7)
Albumin ELP: 3.5 g/dL (ref 2.9–4.4)
Alpha-1-Globulin: 0.2 g/dL (ref 0.0–0.4)
Alpha-2-Globulin: 0.6 g/dL (ref 0.4–1.0)
Beta Globulin: 1.4 g/dL — ABNORMAL HIGH (ref 0.7–1.3)
Gamma Globulin: 1.2 g/dL (ref 0.4–1.8)
Globulin, Total: 3.5 g/dL (ref 2.2–3.9)
Total Protein ELP: 7 g/dL (ref 6.0–8.5)

## 2020-03-28 LAB — COPPER, SERUM: Copper: 105 ug/dL (ref 69–132)

## 2020-04-01 DIAGNOSIS — E083293 Diabetes mellitus due to underlying condition with mild nonproliferative diabetic retinopathy without macular edema, bilateral: Secondary | ICD-10-CM | POA: Diagnosis not present

## 2020-04-03 LAB — METHYLMALONIC ACID, SERUM: Methylmalonic Acid, Quantitative: 138 nmol/L (ref 0–378)

## 2020-04-17 ENCOUNTER — Inpatient Hospital Stay (HOSPITAL_COMMUNITY): Payer: Federal, State, Local not specified - PPO | Attending: Hematology | Admitting: Hematology

## 2020-04-17 ENCOUNTER — Encounter (HOSPITAL_COMMUNITY): Payer: Self-pay | Admitting: Hematology

## 2020-04-17 ENCOUNTER — Other Ambulatory Visit: Payer: Self-pay

## 2020-04-17 VITALS — BP 100/49 | HR 83 | Temp 97.5°F | Resp 19 | Wt 276.7 lb

## 2020-04-17 DIAGNOSIS — I129 Hypertensive chronic kidney disease with stage 1 through stage 4 chronic kidney disease, or unspecified chronic kidney disease: Secondary | ICD-10-CM | POA: Diagnosis not present

## 2020-04-17 DIAGNOSIS — Z79899 Other long term (current) drug therapy: Secondary | ICD-10-CM | POA: Diagnosis not present

## 2020-04-17 DIAGNOSIS — D6959 Other secondary thrombocytopenia: Secondary | ICD-10-CM | POA: Insufficient documentation

## 2020-04-17 DIAGNOSIS — D696 Thrombocytopenia, unspecified: Secondary | ICD-10-CM | POA: Diagnosis not present

## 2020-04-17 DIAGNOSIS — E611 Iron deficiency: Secondary | ICD-10-CM | POA: Diagnosis not present

## 2020-04-17 DIAGNOSIS — G4730 Sleep apnea, unspecified: Secondary | ICD-10-CM | POA: Insufficient documentation

## 2020-04-17 DIAGNOSIS — R760 Raised antibody titer: Secondary | ICD-10-CM | POA: Diagnosis not present

## 2020-04-17 DIAGNOSIS — D631 Anemia in chronic kidney disease: Secondary | ICD-10-CM | POA: Insufficient documentation

## 2020-04-17 DIAGNOSIS — I2510 Atherosclerotic heart disease of native coronary artery without angina pectoris: Secondary | ICD-10-CM | POA: Diagnosis not present

## 2020-04-17 DIAGNOSIS — E785 Hyperlipidemia, unspecified: Secondary | ICD-10-CM | POA: Diagnosis not present

## 2020-04-17 DIAGNOSIS — E1122 Type 2 diabetes mellitus with diabetic chronic kidney disease: Secondary | ICD-10-CM | POA: Diagnosis not present

## 2020-04-17 DIAGNOSIS — R161 Splenomegaly, not elsewhere classified: Secondary | ICD-10-CM | POA: Insufficient documentation

## 2020-04-17 DIAGNOSIS — Z794 Long term (current) use of insulin: Secondary | ICD-10-CM | POA: Insufficient documentation

## 2020-04-17 DIAGNOSIS — M129 Arthropathy, unspecified: Secondary | ICD-10-CM | POA: Diagnosis not present

## 2020-04-17 DIAGNOSIS — Z7982 Long term (current) use of aspirin: Secondary | ICD-10-CM | POA: Diagnosis not present

## 2020-04-17 DIAGNOSIS — N183 Chronic kidney disease, stage 3 unspecified: Secondary | ICD-10-CM | POA: Diagnosis not present

## 2020-04-17 DIAGNOSIS — M25561 Pain in right knee: Secondary | ICD-10-CM | POA: Diagnosis not present

## 2020-04-17 NOTE — Patient Instructions (Addendum)
[place] at [facility] Discharge Instructions  You were seen today by Dr. Delton Coombes. He went over your recent lab results. All of the labs were negative except your Rheumatoid Factor, it was positive. He will schedule you for 2 weekly doses of IV iron. He will see you back in 2 months for labs and follow up.   Thank you for choosing [place] at [facility] to provide your oncology and hematology care.  To afford each patient quality time with our provider, please arrive at least 15 minutes before your scheduled appointment time.   If you have a lab appointment with the [facility] please come in thru the  Main Entrance and check in at the main information desk  You need to re-schedule your appointment should you arrive 10 or more minutes late.  We strive to give you quality time with our providers, and arriving late affects you and other patients whose appointments are after yours.  Also, if you no show three or more times for appointments you may be dismissed from the clinic at the providers discretion.     Again, thank you for choosing [facility].  Our hope is that these requests will decrease the amount of time that you wait before being seen by our physicians.       _____________________________________________________________  Should you have questions after your visit to [facility], please contact our office at [phone] between the hours of 8:00 a.m. and 4:30 p.m.  Voicemails left after 4:00 p.m. will not be returned until the following business day.  For prescription refill requests, have your pharmacy contact our office and allow 72 hours.    Cancer Center Support Programs:   > Cancer Support Group  2nd Tuesday of the month 1pm-2pm, Journey Room

## 2020-04-17 NOTE — Progress Notes (Signed)
[name], [name] [id number]   CLINIC:  Medical Oncology/Hematology  PCP:  [name], [place] [address] [phone]   REASON FOR VISIT:  Follow-up for thrombocytopenia and normocytic anemia.   CURRENT THERAPY: Parenteral iron therapy.   INTERVAL HISTORY:  [name] 59 y.o. male seen for follow-up of moderate thrombocytopenia and anemia.  Denies any bleeding per rectum or melena.  No easy bruising reported.  He is having problems with his right knee and want to have knee replacement done by Dr. Aline Brochure.  Appetite is 75%.  Energy levels are 50%.  Pain in the right knee is reported as 10 out of 10.  He also reports tiredness.    REVIEW OF SYSTEMS:  Review of Systems  Constitutional: Positive for fatigue.  Musculoskeletal:       Right knee pain.  All other systems reviewed and are negative.    PAST MEDICAL/SURGICAL HISTORY:  Past Medical History:  Diagnosis Date  . Anemia   . Arthritis   . CAD (coronary artery disease)    Multivessel disease status post CABG 08/2015  . CKD (chronic kidney disease) stage 3, GFR 30-59 ml/min   . Essential hypertension   . Hyperlipidemia   . OSA on CPAP   . Polyclonal gammopathy   . Thrombocytopenia ([place]) 2016  . Type 2 diabetes mellitus ([place])    Past Surgical History:  Procedure Laterality Date  . APPENDECTOMY    . Biceps tendon surgery Right   . BIOPSY  11/22/2019   Procedure: BIOPSY;  Surgeon: Daneil Dolin, MD;  [facility];  Service: Endoscopy;;  . CARDIAC CATHETERIZATION N/A 08/25/2015   Procedure: Left Heart Cath and Coronary Angiography;  Surgeon: Belva Crome, MD;  Location: [place] CV LAB;  Service: Cardiovascular;  Laterality: N/A;  . COLONOSCOPY WITH PROPOFOL N/A 11/22/2019   Procedure: COLONOSCOPY WITH PROPOFOL;  Surgeon: Daneil Dolin, MD; Four 4-5 mm polyps, findings suggestive of portal colopathy, congested appearing  colonic mucosa diffusely, rectal varices, and adequate right colon prep.  Pathology with tubular adenomas and hyperplastic polyp.  Right colon biopsy with focal active colitis.  Recommendations to repeat colonoscopy in 3 months due to poor prep.  . COLONOSCOPY WITH PROPOFOL N/A 03/17/2020   Procedure: COLONOSCOPY WITH PROPOFOL;  Surgeon: Daneil Dolin, MD;  Location[facility];  Service: Endoscopy;  Laterality: N/A;  8:45am - pt does not need covid test, was + 2/4 <90 days  . CORONARY ARTERY BYPASS GRAFT N/A 08/29/2015   Procedure: CORONARY ARTERY BYPASS GRAFTING (CABG);  Surgeon: [name], MD;  Location: [facility];  Service: Open Heart Surgery;  Laterality: N/A;  . ESOPHAGOGASTRODUODENOSCOPY (EGD) WITH PROPOFOL N/A 11/22/2019   Procedure: ESOPHAGOGASTRODUODENOSCOPY (EGD) WITH PROPOFOL;  Surgeon: Daneil Dolin, MD; 4 columns of grade 2-3 esophageal varices, portal gastropathy, gastric polyp/abnormal gastric mucosa s/p biopsy.  Pathology with hyperplastic polyp, mild chronic gastritis, negative H. pylori.  [name] KNEE ARTHROSCOPY Left   . TEE WITHOUT CARDIOVERSION N/A 08/29/2015   Procedure: TRANSESOPHAGEAL ECHOCARDIOGRAM (TEE);  Surgeon: [name], MD;  Location: [facility];  Service: Open Heart Surgery;  Laterality: N/A;  . TOTAL KNEE ARTHROPLASTY Left 03/09/2017   Procedure: TOTAL KNEE ARTHROPLASTY;  Surgeon: Carole Civil, MD;  Location: [facility];  Service: Orthopedics;  Laterality: Left;     SOCIAL HISTORY:  Social History   Socioeconomic History  . Marital status: Married  Spouse name: Not on file  . Number of children: 0  . Years of education: college  . Highest education level: Not on file  Occupational History  . Occupation: Maintenance tech    Employer: [employer]  Tobacco Use  . Smoking status: Never Smoker  . Smokeless tobacco: Never Used  Substance and Sexual Activity  . Alcohol use: No    Alcohol/week: 0.0 standard drinks  . Drug use: No  . Sexual  activity: Not Currently  Other Topics Concern  . Not on file  Social History Narrative  . Not on file   Social Determinants of Health   Financial Resource Strain:   . Difficulty of Paying Living Expenses:   Food Insecurity:   . Worried About Charity fundraiser in the Last Year:   . Arboriculturist in the Last Year:   Transportation Needs:   . Film/video editor (Medical):   [name] Lack of Transportation (Non-Medical):   Physical Activity:   . Days of Exercise per Week:   . Minutes of Exercise per Session:   Stress:   . Feeling of Stress :   Social Connections:   . Frequency of Communication with Friends and Family:   . Frequency of Social Gatherings with Friends and Family:   . Attends Religious Services:   . Active Member of Clubs or Organizations:   . Attends Archivist Meetings:   [name] Marital Status:   Intimate Partner Violence:   . Fear of Current or Ex-Partner:   . Emotionally Abused:   [name] Physically Abused:   . Sexually Abused:     FAMILY HISTORY:  Family History  Problem Relation Age of Onset  . Arthritis Other   . Cancer Other   . Diabetes Other   . CAD Father   . Diabetes Mellitus II Father   . Liver cancer Father   . Hodgkin's lymphoma Father   . CAD Brother   . Diabetes Mellitus II Brother   . ALS Mother   . Diabetes Mellitus II Sister   . Diabetes Mellitus II Brother   . Diabetes Mellitus II Maternal Grandmother   . Aneurysm Maternal Grandmother   . Cancer Maternal Grandfather   . Anesthesia problems Neg Hx   . Hypotension Neg Hx   . Malignant hyperthermia Neg Hx   . Pseudochol deficiency Neg Hx   . Colon cancer Neg Hx     CURRENT MEDICATIONS:  Outpatient Encounter Medications as of 04/17/2020  Medication Sig  . aspirin EC 81 MG tablet Take 81 mg by mouth at bedtime.   [name] b complex vitamins tablet Take 1 tablet by mouth daily.  [name] Bioflavonoid Products (ESTER C PO) Take 1 capsule by mouth daily.  . Cholecalciferol (DIALYVITE VITAMIN D  5000) 125 MCG (5000 UT) capsule Take 10,000 Units by mouth daily.  [name] ELDERBERRY PO Take 1 capsule by mouth daily. With  Vitamin c and zinc  . ferrous sulfate 325 (65 FE) MG tablet Take 325 mg by mouth daily.  . Flaxseed, Linseed, (FLAXSEED OIL PO) Take 1,400 mg by mouth daily.   . furosemide (LASIX) 20 MG tablet Take 1 tablet (20 mg total) by mouth daily.  . hydrochlorothiazide (HYDRODIURIL) 25 MG tablet Take 12.5 mg by mouth daily.   . insulin aspart (NOVOLOG) 100 UNIT/ML injection Inject 5-30 Units into the skin 3 (three) times daily before meals.   [name] loratadine (CLARITIN) 10 MG tablet Take 10 mg by mouth daily.  [name]  Magnesium 250 MG TABS Take 250 mg by mouth daily.  . Multiple Vitamins-Minerals (MULTIVITAMIN ADULT PO) Take 1 tablet by mouth daily.  . nadolol (CORGARD) 20 MG tablet Take 1 tablet (20 mg total) by mouth daily.  [name] olmesartan (BENICAR) 40 MG tablet Take 40 mg by mouth daily.   [name] omeprazole (PRILOSEC) 40 MG capsule Take 40 mg by mouth at bedtime.  . pravastatin (PRAVACHOL) 20 MG tablet TAKE 1 TABLET(20 MG) BY MOUTH AT BEDTIME (Patient taking differently: Take 20 mg by mouth at bedtime. )  . traMADol-acetaminophen (ULTRACET) 37.5-325 MG tablet Take 1 tablet by mouth every 4 (four) hours as needed.  . TRESIBA FLEXTOUCH 200 UNIT/ML SOPN Inject 40-70 Units into the skin See admin instructions. Inject 70 units into the skin in the morning and 40 units at bedtime  . TURMERIC CURCUMIN PO Take 2,000 mg by mouth daily.   [name] albuterol (VENTOLIN HFA) 108 (90 Base) MCG/ACT inhaler Inhale 2 puffs into the lungs every 6 (six) hours as needed for wheezing or shortness of breath. (Patient not taking: Reported on 03/25/2020)  . Armodafinil 200 MG TABS Use as needed for sleepiness, by mouth. 1 tab  before 12 noon. (Patient not taking: Reported on 03/25/2020)  . bismuth subsalicylate (PEPTO BISMOL) 262 MG/15ML suspension Take 30 mLs by mouth every 6 (six) hours as needed.  [name] (EUCRISA) 2 % OINT  Apply 1 application topically 2 (two) times daily as needed. (Patient not taking: Reported on 03/25/2020)  . dicyclomine (BENTYL) 10 MG capsule Take 1 capsule (10 mg total) by mouth 3 (three) times daily. (Patient not taking: Reported on 03/06/2020)  . diphenhydrAMINE (BENADRYL) 2 % cream Apply 1 application topically 3 (three) times daily as needed for itching.   No facility-administered encounter medications on file as of 04/17/2020.    ALLERGIES:  No Known Allergies   PHYSICAL EXAM:  ECOG Performance status: 1  Vitals:   04/17/20 1533  BP: (!) 100/49  Pulse: 83  Resp: 19  Temp: (!) 97.5 F (36.4 C)  SpO2: 99%   Filed Weights   04/17/20 1533  Weight: 276 lb 11.2 oz (125.5 kg)   Physical Exam Vitals reviewed.  Constitutional:      Appearance: Normal appearance.  HENT:     Head: Normocephalic.  Pulmonary:     Effort: Pulmonary effort is normal.     Breath sounds: Normal breath sounds.  Skin:    General: Skin is warm.  Neurological:     Mental Status: He is alert and oriented to person, place, and time.  Psychiatric:        Mood and Affect: Mood normal.        Behavior: Behavior normal.      LABORATORY DATA:  I have reviewed the labs as listed.  CBC    Component Value Date/Time   WBC 3.3 (L) 03/26/2020 1217   RBC 3.41 (L) 03/26/2020 1217   RBC 3.54 (L) 03/26/2020 1217   HGB 11.8 (L) 03/26/2020 1217   HGB 12.5 (L) 05/03/2019 1005   HGB 10.1 (L) 12/15/2018 1525   HCT 35.4 (L) 03/26/2020 1217   HCT 30.2 (L) 12/15/2018 1525   PLT 66 (L) 03/26/2020 1217   PLT 72 (L) 05/03/2019 1005   PLT 87 (LL) 12/15/2018 1525   MCV 100.0 03/26/2020 1217   MCV 84 12/15/2018 1525   MCH 33.3 03/26/2020 1217   MCHC 33.3 03/26/2020 1217   RDW 14.3 03/26/2020 1217   RDW 15.1 12/15/2018  1525   LYMPHSABS 1.0 03/26/2020 1217   LYMPHSABS 1.5 12/15/2018 1525   MONOABS 0.3 03/26/2020 1217   EOSABS 0.1 03/26/2020 1217   EOSABS 0.1 12/15/2018 1525   BASOSABS 0.0 03/26/2020 1217    BASOSABS 0.0 12/15/2018 1525   CMP Latest Ref Rng & Units 03/13/2020 01/09/2020 11/20/2019  Glucose 70 - 99 mg/dL 346(H) 239(H) 266(H)  BUN 6 - 20 mg/dL 17 26(H) 27(H)  Creatinine 0.61 - 1.24 mg/dL 1.45(H) 1.40(H) 1.52(H)  Sodium 135 - 145 mmol/L 136 136 135  Potassium 3.5 - 5.1 mmol/L 3.6 4.2 3.8  Chloride 98 - 111 mmol/L 98 104 100  CO2 22 - 32 mmol/L 23 23 24   Calcium 8.9 - 10.3 mg/dL 9.4 9.0 9.2  Total Protein 6.1 - 8.1 g/dL - 6.9 -  Total Bilirubin 0.2 - 1.2 mg/dL - 1.0 -  Alkaline Phos 38 - 126 U/L - - -  AST 10 - 35 U/L - 47(H) -  ALT 9 - 46 U/L - 20 -    DIAGNOSTIC IMAGING:  I have reviewed scans.  ASSESSMENT & PLAN:  Thrombocytopenia ([place]) 1.  Moderate thrombocytopenia: -Thrombocytopenia since 2016.  Platelets between 55-90 since 2018. -BMBX on 01/26/2019 showed slightly hypercellular marrow for age with trilineage hematopoiesis.  No significant dyspoiesis or increase in blasts.  Plasma cells are slightly increased in number but with polyclonal staining pattern.  Abundant megakaryocytes with scattered small hypolobulated forms.  Chromosome analysis was normal.  Biopsy was done for work-up of increased IgA levels. -Ultrasound abdomen on 01/15/2020 showed nodular contour of the liver consistent with cirrhosis.  Splenomegaly with volume of 1562 mL. -We reviewed results of blood work from 03/26/2020.  M76, folic acid, methylmalonic acid and copper levels were normal.  SPEP was negative.  CBC shows platelet count 66.  LDH was normal.  ANA was negative.  Rheumatoid factor was positive at 58.6. -Thrombocytopenia secondary to splenomegaly.  There might also be a component of immune mediated thrombocytopenia.  We can give a trial of dexamethasone for 4 days if needed. -He is referred to [facility] liver [facility] team. -He was told to let us know whenever he is planning to have knee replacement surgery by Dr. Aline Brochure.  2.  Normocytic to macrocytic anemia: -CBC on 03/26/2020 shows hemoglobin 11.8.   MCV is 100. -He does have CKD.  Ferritin was 63 and percent saturation was 22. -He is feeling very tired.  This is a combination anemia from CKD and iron deficiency. -Colonoscopy on 03/17/2020 showed inadequate preparation of the colon with rectal varices.  Much of the colon could not be seen. -I have recommended weekly Feraheme x2.  We talked about the side effects of Feraheme including rare chance of anaphylactic reactions.  We will schedule him for iron infusions. -We will see him back in 2 months with repeat CBC, ferritin and iron panel.  3.  Positive rheumatoid factor: -Work-up for thrombocytopenia showed rheumatoid factor positive at 58.6.  ANA was negative. -He does not have any clinical signs or symptoms of rheumatoid arthritis.     Orders placed this encounter:  Orders Placed This Encounter  Procedures  . CBC with Differential  . Comprehensive metabolic panel  . Ferritin  . Iron and TIBC      Derek Jack, MD [place] [phone]

## 2020-04-17 NOTE — Assessment & Plan Note (Signed)
1.  Moderate thrombocytopenia: -Thrombocytopenia since 2016.  Platelets between 55-90 since 2018. -BMBX on 01/26/2019 showed slightly hypercellular marrow for age with trilineage hematopoiesis.  No significant dyspoiesis or increase in blasts.  Plasma cells are slightly increased in number but with polyclonal staining pattern.  Abundant megakaryocytes with scattered small hypolobulated forms.  Chromosome analysis was normal.  Biopsy was done for work-up of increased IgA levels. -Ultrasound abdomen on 01/15/2020 showed nodular contour of the liver consistent with cirrhosis.  Splenomegaly with volume of 1562 mL. -We reviewed results of blood work from 03/26/2020.  X43, folic acid, methylmalonic acid and copper levels were normal.  SPEP was negative.  CBC shows platelet count 66.  LDH was normal.  ANA was negative.  Rheumatoid factor was positive at 58.6. -Thrombocytopenia secondary to splenomegaly.  There might also be a component of immune mediated thrombocytopenia.  We can give a trial of dexamethasone for 4 days if needed. -He is referred to [facility] liver transplant team. -He was told to let us know whenever he is planning to have knee replacement surgery by Dr. Aline Brochure.  2.  Normocytic to macrocytic anemia: -CBC on 03/26/2020 shows hemoglobin 11.8.  MCV is 100. -He does have CKD.  Ferritin was 63 and percent saturation was 22. -He is feeling very tired.  This is a combination anemia from CKD and iron deficiency. -Colonoscopy on 03/17/2020 showed inadequate preparation of the colon with rectal varices.  Much of the colon could not be seen. -I have recommended weekly Feraheme x2.  We talked about the side effects of Feraheme including rare chance of anaphylactic reactions.  We will schedule him for iron infusions. -We will see him back in 2 months with repeat CBC, ferritin and iron panel.  3.  Positive rheumatoid factor: -Work-up for thrombocytopenia showed rheumatoid factor positive at 58.6.  ANA was  negative. -He does not have any clinical signs or symptoms of rheumatoid arthritis.

## 2020-04-25 ENCOUNTER — Other Ambulatory Visit: Payer: Self-pay

## 2020-04-25 ENCOUNTER — Encounter (HOSPITAL_COMMUNITY): Payer: Self-pay

## 2020-04-25 ENCOUNTER — Inpatient Hospital Stay (HOSPITAL_COMMUNITY): Payer: Federal, State, Local not specified - PPO

## 2020-04-25 ENCOUNTER — Other Ambulatory Visit: Payer: Self-pay | Admitting: Orthopedic Surgery

## 2020-04-25 VITALS — BP 91/39 | HR 70 | Temp 98.2°F | Resp 18

## 2020-04-25 DIAGNOSIS — G4730 Sleep apnea, unspecified: Secondary | ICD-10-CM | POA: Diagnosis not present

## 2020-04-25 DIAGNOSIS — E611 Iron deficiency: Secondary | ICD-10-CM | POA: Diagnosis not present

## 2020-04-25 DIAGNOSIS — Z79899 Other long term (current) drug therapy: Secondary | ICD-10-CM | POA: Diagnosis not present

## 2020-04-25 DIAGNOSIS — D6959 Other secondary thrombocytopenia: Secondary | ICD-10-CM | POA: Diagnosis not present

## 2020-04-25 DIAGNOSIS — D509 Iron deficiency anemia, unspecified: Secondary | ICD-10-CM

## 2020-04-25 DIAGNOSIS — Z794 Long term (current) use of insulin: Secondary | ICD-10-CM | POA: Diagnosis not present

## 2020-04-25 DIAGNOSIS — I129 Hypertensive chronic kidney disease with stage 1 through stage 4 chronic kidney disease, or unspecified chronic kidney disease: Secondary | ICD-10-CM | POA: Diagnosis not present

## 2020-04-25 DIAGNOSIS — E785 Hyperlipidemia, unspecified: Secondary | ICD-10-CM | POA: Diagnosis not present

## 2020-04-25 DIAGNOSIS — M25561 Pain in right knee: Secondary | ICD-10-CM | POA: Diagnosis not present

## 2020-04-25 DIAGNOSIS — E1122 Type 2 diabetes mellitus with diabetic chronic kidney disease: Secondary | ICD-10-CM | POA: Diagnosis not present

## 2020-04-25 DIAGNOSIS — R161 Splenomegaly, not elsewhere classified: Secondary | ICD-10-CM | POA: Diagnosis not present

## 2020-04-25 DIAGNOSIS — M129 Arthropathy, unspecified: Secondary | ICD-10-CM | POA: Diagnosis not present

## 2020-04-25 DIAGNOSIS — Z7982 Long term (current) use of aspirin: Secondary | ICD-10-CM | POA: Diagnosis not present

## 2020-04-25 DIAGNOSIS — I2510 Atherosclerotic heart disease of native coronary artery without angina pectoris: Secondary | ICD-10-CM | POA: Diagnosis not present

## 2020-04-25 DIAGNOSIS — N183 Chronic kidney disease, stage 3 unspecified: Secondary | ICD-10-CM | POA: Diagnosis not present

## 2020-04-25 DIAGNOSIS — R760 Raised antibody titer: Secondary | ICD-10-CM | POA: Diagnosis not present

## 2020-04-25 DIAGNOSIS — D631 Anemia in chronic kidney disease: Secondary | ICD-10-CM | POA: Diagnosis not present

## 2020-04-25 MED ORDER — SODIUM CHLORIDE 0.9 % IV SOLN: Freq: Once | INTRAVENOUS | Status: AC

## 2020-04-25 MED ORDER — SODIUM CHLORIDE 0.9 % IV SOLN
510.0000 mg | Freq: Once | INTRAVENOUS | Status: AC
  Administered 2020-04-25: 510 mg via INTRAVENOUS
  Filled 2020-04-25: qty 510

## 2020-04-25 NOTE — Progress Notes (Signed)
Patient presents today for first Feraheme infusion. MAR reviewed and updated. Vital signs stable. Patient has no complaints of any changes since his last visit.   Feraheme given today per MD orders. Tolerated infusion without adverse affects. Vital signs stable. No complaints at this time. Discharged from clinic ambulatory. F/U with [facility] as scheduled.

## 2020-04-25 NOTE — Patient Instructions (Signed)
[facility] at [facility]  Discharge Instructions:   _______________________________________________________________  Thank you for choosing [facility] at [facility] to provide your oncology and hematology care.  To afford each patient quality time with our providers, please arrive at least 15 minutes before your scheduled appointment.  You need to re-schedule your appointment if you arrive 10 or more minutes late.  We strive to give you quality time with our providers, and arriving late affects you and other patients whose appointments are after yours.  Also, if you no show three or more times for appointments you may be dismissed from the clinic.  Again, thank you for choosing [facility] at [facility]. Our hope is that these requests will allow you access to exceptional care and in a timely manner. _______________________________________________________________  If you have questions after your visit, please contact our office at [phone] between the hours of 8:30 a.m. and 5:00 p.m. Voicemails left after 4:30 p.m. will not be returned until the following business day. _______________________________________________________________  For prescription refill requests, have your pharmacy contact our office. _______________________________________________________________  Recommendations made by the consultant and any test results will be sent to your referring physician. _______________________________________________________________ 

## 2020-04-30 DIAGNOSIS — N189 Chronic kidney disease, unspecified: Secondary | ICD-10-CM | POA: Diagnosis not present

## 2020-04-30 DIAGNOSIS — E1165 Type 2 diabetes mellitus with hyperglycemia: Secondary | ICD-10-CM | POA: Diagnosis not present

## 2020-04-30 DIAGNOSIS — E7800 Pure hypercholesterolemia, unspecified: Secondary | ICD-10-CM | POA: Diagnosis not present

## 2020-04-30 DIAGNOSIS — I10 Essential (primary) hypertension: Secondary | ICD-10-CM | POA: Diagnosis not present

## 2020-05-02 ENCOUNTER — Encounter (HOSPITAL_COMMUNITY): Payer: Self-pay

## 2020-05-02 ENCOUNTER — Inpatient Hospital Stay (HOSPITAL_COMMUNITY): Payer: Federal, State, Local not specified - PPO

## 2020-05-02 ENCOUNTER — Other Ambulatory Visit: Payer: Self-pay

## 2020-05-02 VITALS — BP 103/61 | HR 61 | Temp 98.1°F | Resp 18

## 2020-05-02 DIAGNOSIS — R161 Splenomegaly, not elsewhere classified: Secondary | ICD-10-CM | POA: Diagnosis not present

## 2020-05-02 DIAGNOSIS — D509 Iron deficiency anemia, unspecified: Secondary | ICD-10-CM

## 2020-05-02 DIAGNOSIS — I129 Hypertensive chronic kidney disease with stage 1 through stage 4 chronic kidney disease, or unspecified chronic kidney disease: Secondary | ICD-10-CM | POA: Diagnosis not present

## 2020-05-02 DIAGNOSIS — D631 Anemia in chronic kidney disease: Secondary | ICD-10-CM | POA: Diagnosis not present

## 2020-05-02 DIAGNOSIS — M25561 Pain in right knee: Secondary | ICD-10-CM | POA: Diagnosis not present

## 2020-05-02 DIAGNOSIS — E785 Hyperlipidemia, unspecified: Secondary | ICD-10-CM | POA: Diagnosis not present

## 2020-05-02 DIAGNOSIS — I2510 Atherosclerotic heart disease of native coronary artery without angina pectoris: Secondary | ICD-10-CM | POA: Diagnosis not present

## 2020-05-02 DIAGNOSIS — E611 Iron deficiency: Secondary | ICD-10-CM | POA: Diagnosis not present

## 2020-05-02 DIAGNOSIS — D6959 Other secondary thrombocytopenia: Secondary | ICD-10-CM | POA: Diagnosis not present

## 2020-05-02 DIAGNOSIS — G4730 Sleep apnea, unspecified: Secondary | ICD-10-CM | POA: Diagnosis not present

## 2020-05-02 DIAGNOSIS — Z79899 Other long term (current) drug therapy: Secondary | ICD-10-CM | POA: Diagnosis not present

## 2020-05-02 DIAGNOSIS — M129 Arthropathy, unspecified: Secondary | ICD-10-CM | POA: Diagnosis not present

## 2020-05-02 DIAGNOSIS — R760 Raised antibody titer: Secondary | ICD-10-CM | POA: Diagnosis not present

## 2020-05-02 DIAGNOSIS — Z794 Long term (current) use of insulin: Secondary | ICD-10-CM | POA: Diagnosis not present

## 2020-05-02 DIAGNOSIS — E1122 Type 2 diabetes mellitus with diabetic chronic kidney disease: Secondary | ICD-10-CM | POA: Diagnosis not present

## 2020-05-02 DIAGNOSIS — N183 Chronic kidney disease, stage 3 unspecified: Secondary | ICD-10-CM | POA: Diagnosis not present

## 2020-05-02 DIAGNOSIS — Z7982 Long term (current) use of aspirin: Secondary | ICD-10-CM | POA: Diagnosis not present

## 2020-05-02 MED ORDER — SODIUM CHLORIDE 0.9 % IV SOLN: Freq: Once | INTRAVENOUS | Status: AC

## 2020-05-02 MED ORDER — SODIUM CHLORIDE 0.9 % IV SOLN
510.0000 mg | Freq: Once | INTRAVENOUS | Status: AC
  Administered 2020-05-02: 510 mg via INTRAVENOUS
  Filled 2020-05-02: qty 510

## 2020-05-02 NOTE — Progress Notes (Signed)
Patient tolerated iron infusion with no complaints voiced.  Peripheral IV site clean and dry with good blood return noted before and after infusion.  Band aid applied.  VSS with discharge and left ambulatory with no s/s of distress noted.  

## 2020-05-13 ENCOUNTER — Encounter: Payer: Self-pay | Admitting: Cardiology

## 2020-05-13 ENCOUNTER — Ambulatory Visit (INDEPENDENT_AMBULATORY_CARE_PROVIDER_SITE_OTHER): Payer: Federal, State, Local not specified - PPO | Admitting: Cardiology

## 2020-05-13 ENCOUNTER — Other Ambulatory Visit: Payer: Self-pay

## 2020-05-13 VITALS — BP 122/68 | HR 68 | Ht 70.5 in | Wt 296.0 lb

## 2020-05-13 DIAGNOSIS — E782 Mixed hyperlipidemia: Secondary | ICD-10-CM | POA: Diagnosis not present

## 2020-05-13 DIAGNOSIS — I25119 Atherosclerotic heart disease of native coronary artery with unspecified angina pectoris: Secondary | ICD-10-CM

## 2020-05-13 DIAGNOSIS — I6523 Occlusion and stenosis of bilateral carotid arteries: Secondary | ICD-10-CM

## 2020-05-13 MED ORDER — MEDICAL COMPRESSION STOCKINGS MISC: 1.0000 | 0 refills | Status: DC

## 2020-05-13 NOTE — Patient Instructions (Addendum)
Medication Instructions:   Your physician recommends that you continue on your current medications as directed. Please refer to the Current Medication list given to you today.  Labwork:  NONE  Testing/Procedures: Your physician has requested that you have a carotid duplex. This test is an ultrasound of the carotid arteries in your neck. It looks at blood flow through these arteries that supply the brain with blood. Allow one hour for this exam. There are no restrictions or special instructions.  Follow-Up:  Your physician recommends that you schedule a follow-up appointment in: 6 months (office)   Any Other Special Instructions Will Be Listed Below (If Applicable).  Compression Stockings prescription given to you today  If you need a refill on your cardiac medications before your next appointment, please call your pharmacy.

## 2020-05-13 NOTE — Progress Notes (Signed)
Cardiology Office Note  Date: 05/13/2020   ID: [name], DOB Feb 28, 1961, MRN [id number]  PCP:  Christopher Sites, MD  Cardiologist:  Christopher Lesches, MD Electrophysiologist:  None   Chief Complaint  Patient presents with  . Cardiac follow-up    History of Present Illness: [name] is a 59 y.o. male last seen in December 2020.  He is here for a routine visit.  From a cardiac perspective, he is not report any active angina symptoms on medical therapy.  He is limited by arthritic right knee pain.  He is due for follow-up carotid Dopplers.  We discussed getting these arranged.  I reviewed his medications which are outlined below.  Cardiac regimen includes aspirin, Lasix, nadolol, Benicar, and Pravachol.  He continues to wear knee-high compression stockings for treatment of leg swelling and lymphedema.  He follows with [name] thrombocytopenia and anemia.  Recent lab work is outlined below.  Past Medical History:  Diagnosis Date  . Anemia   . Arthritis   . CAD (coronary artery disease)    Multivessel disease status post CABG 08/2015  . CKD (chronic kidney disease) stage 3, GFR 30-59 ml/min   . Essential hypertension   . Hyperlipidemia   . OSA on CPAP   . Polyclonal gammopathy   . Thrombocytopenia ([place]) 2016  . Type 2 diabetes mellitus ([place])     Past Surgical History:  Procedure Laterality Date  . APPENDECTOMY    . Biceps tendon surgery Right   . BIOPSY  11/22/2019   Procedure: BIOPSY;  Surgeon: Christopher Dolin, MD;  [facility]: [facility];  Service: Endoscopy;;  . CARDIAC CATHETERIZATION N/A 08/25/2015   Procedure: Left Heart Cath and Coronary Angiography;  Surgeon: Christopher Crome, MD;  Location: [facility];  Service: Cardiovascular;  Laterality: N/A;  . COLONOSCOPY WITH PROPOFOL N/A 11/22/2019   Procedure: COLONOSCOPY WITH PROPOFOL;  Surgeon: Christopher Dolin, MD; Four 4-5 mm polyps, findings suggestive of portal colopathy, congested  appearing colonic mucosa diffusely, rectal varices, and adequate right colon prep.  Pathology with tubular adenomas and hyperplastic polyp.  Right colon biopsy with focal active colitis.  Recommendations to repeat colonoscopy in 3 months due to poor prep.  . COLONOSCOPY WITH PROPOFOL N/A 03/17/2020   Procedure: COLONOSCOPY WITH PROPOFOL;  Surgeon: Christopher Dolin, MD;  [facility];  Service: Endoscopy;  Laterality: N/A;  8:45am - pt does not need covid test, was + 2/4 <90 days  . CORONARY ARTERY BYPASS GRAFT N/A 08/29/2015   Procedure: CORONARY ARTERY BYPASS GRAFTING (CABG);  Surgeon: Christopher Nakayama, MD;  Location: [place];  Service: Open Heart Surgery;  Laterality: N/A;  . ESOPHAGOGASTRODUODENOSCOPY (EGD) WITH PROPOFOL N/A 11/22/2019   Procedure: ESOPHAGOGASTRODUODENOSCOPY (EGD) WITH PROPOFOL;  Surgeon: Christopher Dolin, MD; 4 columns of grade 2-3 esophageal varices, portal gastropathy, gastric polyp/abnormal gastric mucosa s/p biopsy.  Pathology with hyperplastic polyp, mild chronic gastritis, negative H. pylori.  [name] KNEE ARTHROSCOPY Left   . TEE WITHOUT CARDIOVERSION N/A 08/29/2015   Procedure: TRANSESOPHAGEAL ECHOCARDIOGRAM (TEE);  Surgeon: Christopher Nakayama, MD;  Location: [place];  Service: Open Heart Surgery;  Laterality: N/A;  . TOTAL KNEE ARTHROPLASTY Left 03/09/2017   Procedure: TOTAL KNEE ARTHROPLASTY;  Surgeon: Christopher Civil, MD;  Location: [facility];  Service: Orthopedics;  Laterality: Left;    Current Outpatient Medications  Medication Sig Dispense Refill  . albuterol (VENTOLIN HFA) 108 (90 Base) MCG/ACT inhaler Inhale 2 puffs into  the lungs every 6 (six) hours as needed for wheezing or shortness of breath. 18 g 0  . Armodafinil 200 MG TABS Use as needed for sleepiness, by mouth. 1 tab  before 12 noon. 30 tablet 5  . aspirin EC 81 MG tablet Take 81 mg by mouth at bedtime.     [name] b complex vitamins tablet Take 1 tablet by mouth daily.    [name] Bioflavonoid Products (ESTER C  PO) Take 1 capsule by mouth daily.    [name] bismuth subsalicylate (PEPTO BISMOL) 262 MG/15ML suspension Take 30 mLs by mouth every 6 (six) hours as needed.    . Cholecalciferol (DIALYVITE VITAMIN D 5000) 125 MCG (5000 UT) capsule Take 10,000 Units by mouth daily.    [name] (EUCRISA) 2 % OINT Apply 1 application topically 2 (two) times daily as needed. 60 g 5  . diphenhydrAMINE (BENADRYL) 2 % cream Apply 1 application topically 3 (three) times daily as needed for itching.    [name] Bandages & Supports (MEDICAL COMPRESSION STOCKINGS) MISC 1 each by Does not apply route as directed. Low pressure knee high compression stockings Dx: leg swelling 1 each 0  . ELDERBERRY PO Take 1 capsule by mouth daily. With  Vitamin c and zinc    . ferrous sulfate 325 (65 FE) MG tablet Take 325 mg by mouth daily.    . Flaxseed, Linseed, (FLAXSEED OIL PO) Take 1,400 mg by mouth daily.     . furosemide (LASIX) 20 MG tablet Take 1 tablet (20 mg total) by mouth daily. 90 tablet 2  . hydrochlorothiazide (HYDRODIURIL) 25 MG tablet Take 12.5 mg by mouth daily.   3  . insulin aspart (NOVOLOG) 100 UNIT/ML injection Inject 5-30 Units into the skin 3 (three) times daily before meals.     [name] loratadine (CLARITIN) 10 MG tablet Take 10 mg by mouth daily.    . Magnesium 250 MG TABS Take 250 mg by mouth daily.    . Multiple Vitamins-Minerals (MULTIVITAMIN ADULT PO) Take 1 tablet by mouth daily.    . nadolol (CORGARD) 20 MG tablet Take 1 tablet (20 mg total) by mouth daily. 30 tablet 3  . olmesartan (BENICAR) 40 MG tablet Take 40 mg by mouth daily.     [name] omeprazole (PRILOSEC) 40 MG capsule Take 40 mg by mouth at bedtime.  2  . pravastatin (PRAVACHOL) 20 MG tablet TAKE 1 TABLET(20 MG) BY MOUTH AT BEDTIME (Patient taking differently: Take 20 mg by mouth at bedtime. ) 90 tablet 3  . traMADol-acetaminophen (ULTRACET) 37.5-325 MG tablet TAKE 1 TABLET BY MOUTH EVERY 4 HOURS AS NEEDED 60 tablet 0  . TRESIBA FLEXTOUCH 200 UNIT/ML SOPN  Inject 40-70 Units into the skin See admin instructions. Inject 70 units into the skin in the morning and 40 units at bedtime    . TURMERIC CURCUMIN PO Take 2,000 mg by mouth daily.      No current facility-administered medications for this visit.   Allergies:  Patient has no known allergies.   ROS:   No palpitations or syncope.  Physical Exam: VS:  BP 122/68   Pulse 68   Ht 5' 10.5" (1.791 m)   Wt 296 lb (134.3 kg)   SpO2 97%   BMI 41.87 kg/m , BMI Body mass index is 41.87 kg/m.  Wt Readings from Last 3 Encounters:  05/13/20 296 lb (134.3 kg)  04/17/20 276 lb 11.2 oz (125.5 kg)  03/26/20 261 lb (118.4 kg)    General:  Patient appears comfortable at rest. HEENT: Conjunctiva and lids normal, wearing a mask. Neck: Supple, no elevated JVP or carotid bruits, no thyromegaly. Lungs: Clear to auscultation, nonlabored breathing at rest. Cardiac: Regular rate and rhythm, no S3, soft systolic murmur, no pericardial rub. Abdomen: Obese, nontender, bowel sounds present. Extremities: Lower extremity edema right greater than left with compression stockings in place, distal pulses 2+.  ECG:  An ECG dated 11/26/2019 was personally reviewed today and demonstrated:  Sinus rhythm with low voltage, poor R wave progression, nonspecific T wave changes.  Recent Labwork: 08/31/2019: TSH 2.400 01/09/2020: ALT 20; AST 47 03/13/2020: BUN 17; Creatinine, Ser 1.45; Potassium 3.6; Sodium 136 03/26/2020: Hemoglobin 11.8; Platelets 66     Component Value Date/Time   CHOL 120 05/25/2019 1001   TRIG 136 05/25/2019 1001   HDL 38 (L) 05/25/2019 1001   CHOLHDL 3.2 05/25/2019 1001   VLDL 27 05/25/2019 1001   LDLCALC 55 05/25/2019 1001    Other Studies Reviewed Today:  Echocardiogram 05/10/2018: Study Conclusions  - Left ventricle: The cavity size was normal. Wall thickness was increased in a pattern of mild LVH. Systolic function was normal. The estimated ejection fraction was in the range of 55% to  60%. Wall motion was normal; there were no regional wall motion abnormalities. Left ventricular diastolic function parameters were normal for the patient&'s age. - Aortic valve: Moderately calcified annulus. Trileaflet; mildly calcified leaflets. There was mild stenosis. Mean gradient (S): 10 mm Hg. Peak gradient (S): 18 mm Hg. Valve area (VTI): 1.68 cm^2. - Mitral valve: Mildly calcified annulus. There was trivial regurgitation. - Right ventricle: Systolic function was low normal. - Right atrium: Central venous pressure (est): 3 mm Hg. - Atrial septum: No defect or patent foramen ovale was identified. - Tricuspid valve: There was trivial regurgitation. - Pulmonary arteries: PA peak pressure: 9 mm Hg (S). - Pericardium, extracardiac: There was no pericardial effusion.  Carotid Dopplers 05/28/2019: IMPRESSION: Minimal amount of atherosclerotic plaque results in elevated peak systolic velocities within the bilateral internal carotid arteries compatible with the 50-69% luminal narrowing range bilaterally. Further evaluation with CTA could performed as clinically indicated.  Assessment and Plan:  1.  Multivessel CAD status post CABG in 2016.  He does not report any progressive angina.  Continue medical therapy including aspirin, nadolol, Benicar, and Pravachol.  LVEF 55 to 60% by last assessment.  2.  Mild aortic stenosis, asymptomatic with mean gradient 10 mmHg as of 2019.  3.  Chronic leg edema and lymphedema.  Continue compression stockings along with Lasix.  Weight loss would also be beneficial.  4.  Carotid artery disease.  Follow-up carotid Dopplers will be obtained. Continue aspirin and statin.  Medication Adjustments/Labs and Tests Ordered: Current medicines are reviewed at length with the patient today.  Concerns regarding medicines are outlined above.   Tests Ordered: Orders Placed This Encounter  Procedures  . VAS US CAROTID    Medication Changes: Meds  ordered this encounter  Medications  . Elastic Bandages & Supports (MEDICAL COMPRESSION STOCKINGS) MISC    Sig: 1 each by Does not apply route as directed. Low pressure knee high compression stockings Dx: leg swelling    Dispense:  1 each    Refill:  0    Disposition:  Follow up 6 months.  Signed, Satira Sark, MD, [facility] 05/13/2020 1:30 PM    [facility] [address], [place], [place] [address] Phone: [phone]; Fax: [phone]

## 2020-05-14 ENCOUNTER — Ambulatory Visit (INDEPENDENT_AMBULATORY_CARE_PROVIDER_SITE_OTHER): Payer: Federal, State, Local not specified - PPO

## 2020-05-14 ENCOUNTER — Other Ambulatory Visit: Payer: Self-pay | Admitting: Cardiology

## 2020-05-14 DIAGNOSIS — I6523 Occlusion and stenosis of bilateral carotid arteries: Secondary | ICD-10-CM

## 2020-05-20 ENCOUNTER — Telehealth: Payer: Self-pay | Admitting: *Deleted

## 2020-05-20 ENCOUNTER — Other Ambulatory Visit: Payer: Self-pay | Admitting: *Deleted

## 2020-05-20 DIAGNOSIS — I10 Essential (primary) hypertension: Secondary | ICD-10-CM

## 2020-05-20 MED ORDER — FUROSEMIDE 20 MG PO TABS: 40.0000 mg | ORAL_TABLET | Freq: Every day | ORAL | 2 refills | Status: DC

## 2020-05-20 NOTE — Telephone Encounter (Signed)
Patient informed and verbalized understanding of plan. Lab orders faxed to [facility] send new rx if continue 40 mg lasix daily after lab work is done

## 2020-05-20 NOTE — Telephone Encounter (Signed)
Patient informed. Copy sent to PCP °

## 2020-05-20 NOTE — Telephone Encounter (Signed)
Let's double Lasix to 40 mg daily, check BMET in 7 to 10 days.

## 2020-05-20 NOTE — Telephone Encounter (Signed)
-----   Message from Satira Sark, MD sent at 05/15/2020  7:47 PM EDT ----- Results reviewed. Stable, mild to moderate ICA atherosclerosis, asymptomatic. Continue ASA and Pravachol.

## 2020-05-20 NOTE — Telephone Encounter (Signed)
Reports swelling in legs and torso that he noticed on 05/15/2020 that has gradually worsened each day. Weighed 292 lbs on 05/13/2020; weighed 305 lbs on 05/15/2020 and 308 lbs today. Reports having a little SOB. Denies chest pain or dizziness. Medications reviewed. Advised that message would be sent to provider.

## 2020-05-21 ENCOUNTER — Other Ambulatory Visit: Payer: Self-pay | Admitting: Gastroenterology

## 2020-05-21 DIAGNOSIS — I8500 Esophageal varices without bleeding: Secondary | ICD-10-CM

## 2020-05-21 DIAGNOSIS — K7460 Unspecified cirrhosis of liver: Secondary | ICD-10-CM

## 2020-05-23 ENCOUNTER — Telehealth: Payer: Self-pay

## 2020-05-23 ENCOUNTER — Other Ambulatory Visit (HOSPITAL_COMMUNITY): Payer: Federal, State, Local not specified - PPO

## 2020-05-23 DIAGNOSIS — R0602 Shortness of breath: Secondary | ICD-10-CM

## 2020-05-23 MED ORDER — FUROSEMIDE 20 MG PO TABS: 80.0000 mg | ORAL_TABLET | Freq: Every day | ORAL | 2 refills | Status: DC

## 2020-05-23 NOTE — Telephone Encounter (Signed)
Echo 830 am Monday at [facility] and APP with Rosaria Ferries on 6/18 at 130 pm   Wife notified, will also weigh and increase lasix to 80 mg qd

## 2020-05-23 NOTE — Telephone Encounter (Signed)
Wife states since 6/1 office visit patient has gained so much weight, she had to buy clothes 2 sizes larger.He has not weighed self and is out at the store now unable to weigh. She reports that he has a cough and gets SOB bending over and when getting off toilet. His current dose of lasix is 40 mg daily.

## 2020-05-23 NOTE — Telephone Encounter (Signed)
Please have him double Lasix dose for now, record weights daily.  Schedule a follow-up echocardiogram for Monday and then an office encounter with APP either in [place] or [place] next week to determine if further medication adjustments are needed.

## 2020-05-26 ENCOUNTER — Ambulatory Visit (HOSPITAL_COMMUNITY)
Admission: RE | Admit: 2020-05-26 | Discharge: 2020-05-26 | Disposition: A | Discharge status: Home / Self Care | Payer: Federal, State, Local not specified - PPO | Source: Ambulatory Visit | Attending: Cardiology | Admitting: Cardiology

## 2020-05-26 ENCOUNTER — Other Ambulatory Visit: Payer: Self-pay

## 2020-05-26 DIAGNOSIS — R0602 Shortness of breath: Secondary | ICD-10-CM | POA: Diagnosis not present

## 2020-05-26 NOTE — Progress Notes (Deleted)
*  PRELIMINARY RESULTS* Echocardiogram 2D Echocardiogram has been performed.  Leavy Cella 05/26/2020, 9:38 AM

## 2020-05-26 NOTE — Progress Notes (Signed)
*  PRELIMINARY RESULTS* Echocardiogram 2D Echocardiogram has been performed.  Samuel Germany 05/26/2020, 9:39 AM

## 2020-05-30 ENCOUNTER — Encounter: Payer: Self-pay | Admitting: Physician Assistant

## 2020-05-30 ENCOUNTER — Other Ambulatory Visit: Payer: Self-pay

## 2020-05-30 ENCOUNTER — Ambulatory Visit (INDEPENDENT_AMBULATORY_CARE_PROVIDER_SITE_OTHER): Payer: Federal, State, Local not specified - PPO | Admitting: Physician Assistant

## 2020-05-30 ENCOUNTER — Other Ambulatory Visit (HOSPITAL_COMMUNITY)
Admission: RE | Admit: 2020-05-30 | Discharge: 2020-05-30 | Disposition: A | Discharge status: Home / Self Care | Payer: Federal, State, Local not specified - PPO | Source: Ambulatory Visit | Attending: Physician Assistant | Admitting: Physician Assistant

## 2020-05-30 VITALS — BP 110/60 | HR 74 | Ht 70.5 in | Wt 300.0 lb

## 2020-05-30 DIAGNOSIS — I5033 Acute on chronic diastolic (congestive) heart failure: Secondary | ICD-10-CM | POA: Diagnosis not present

## 2020-05-30 DIAGNOSIS — I2510 Atherosclerotic heart disease of native coronary artery without angina pectoris: Secondary | ICD-10-CM | POA: Diagnosis not present

## 2020-05-30 DIAGNOSIS — I6523 Occlusion and stenosis of bilateral carotid arteries: Secondary | ICD-10-CM

## 2020-05-30 DIAGNOSIS — I10 Essential (primary) hypertension: Secondary | ICD-10-CM

## 2020-05-30 LAB — BASIC METABOLIC PANEL
Anion gap: 9 (ref 5–15)
BUN: 24 mg/dL — ABNORMAL HIGH (ref 6–20)
CO2: 25 mmol/L (ref 22–32)
Calcium: 9 mg/dL (ref 8.9–10.3)
Chloride: 104 mmol/L (ref 98–111)
Creatinine, Ser: 1.39 mg/dL — ABNORMAL HIGH (ref 0.61–1.24)
GFR calc Af Amer: >60 mL/min (ref >60)
GFR calc non Af Amer: 55 mL/min — ABNORMAL LOW (ref >60)
Glucose, Bld: 182 mg/dL — ABNORMAL HIGH (ref 70–99)
Potassium: 4.1 mmol/L (ref 3.5–5.1)
Sodium: 138 mmol/L (ref 135–145)

## 2020-05-30 NOTE — Progress Notes (Signed)
Cardiology Office Note   Date:  05/30/2020   ID:  [name], DOB Jun 24, 1961, MRN [id number]  PCP:  [name], MD Cardiologist:  Rozann Lesches, MD 05/13/2020 Electrphysiologist: None Rosaria Ferries, PA-C   No chief complaint on file.   History of Present Illness: [name] is a 59 y.o. male with a history of CABG 2016, CKD III, DM, HTN, HLD, OSA on CPAP, low plt, polyclonal gammopathy, carotid dz, mild AS, lymphedema, COVID PNA 01/2020.   06/01 office visit pt doing well, wt 296 (up 20 lbs) 06/08 phone notes, wt 308, Lasix increased to 40 mg qd 06/11 phone notes, wt higher, SOB worse, Lasix 40 mg bid, echo, f/u appt made  [name] presents for cardiology follow up.  He drinks 4-5 1/2 L bottles daily plus 3-4 bottles Gatorade. He is willing to see a nutritionist.   His peak weight was 312 lbs, he is 300 lbs today. He definitely lost a lot of fluid.  Although his legs are still very swollen, they are smaller than they were.   He is compliant w/ CPAP, no PND. +DOE but activity is limited by knee pain more than breathing. He needs R TKR but needs to get gallbladder issues figured out first.   He needs his gallbladder out, he has a polyp. He was referred to [facility] because he also has cirrhosis and the MDs were concerned that he might need a liver transplant after his gallbladder is removed.   He has not had chest pain.  He admits that he has not been very active.  He is not able to walk for exercise, and has trouble exerting himself because of his knees.  His wife admits that they might be able to do better with a diabetic diet, but she feels she is really trying to avoid sodium.  He has problems with urge and stress incontinence.  He admits that after he takes the Lasix, he has a lot of trouble with frequent urination.   Past Medical History:  Diagnosis Date  . Anemia   . Arthritis   . CAD (coronary artery disease)    Multivessel  disease status post CABG 08/2015  . CKD (chronic kidney disease) stage 3, GFR 30-59 ml/min   . Essential hypertension   . Hyperlipidemia   . OSA on CPAP   . Polyclonal gammopathy   . Thrombocytopenia ([place]) 2016  . Type 2 diabetes mellitus ([place])     Past Surgical History:  Procedure Laterality Date  . APPENDECTOMY    . Biceps tendon surgery Right   . BIOPSY  11/22/2019   Procedure: BIOPSY;  Surgeon: Daneil Dolin, MD;  [facility];  Service: Endoscopy;;  . CARDIAC CATHETERIZATION N/A 08/25/2015   Procedure: Left Heart Cath and Coronary Angiography;  Surgeon: Belva Crome, MD;  Location: [place] [facility];  Service: Cardiovascular;  Laterality: N/A;  . COLONOSCOPY WITH PROPOFOL N/A 11/22/2019   Procedure: COLONOSCOPY WITH PROPOFOL;  Surgeon: Daneil Dolin, MD; Four 4-5 mm polyps, findings suggestive of portal colopathy, congested appearing colonic mucosa diffusely, rectal varices, and adequate right colon prep.  Pathology with tubular adenomas and hyperplastic polyp.  Right colon biopsy with focal active colitis.  Recommendations to repeat colonoscopy in 3 months due to poor prep.  . COLONOSCOPY WITH PROPOFOL N/A 03/17/2020   Procedure: COLONOSCOPY WITH PROPOFOL;  Surgeon: Daneil Dolin, MD;  Location[facility];  Service: Endoscopy;  Laterality: N/A;  8:45am - pt does not need covid test, was + 2/4 <90 days  . CORONARY ARTERY BYPASS GRAFT N/A 08/29/2015   Procedure: CORONARY ARTERY BYPASS GRAFTING (CABG);  Surgeon: [name], MD;  Location: [place];  Service: Open Heart Surgery;  Laterality: N/A;  . ESOPHAGOGASTRODUODENOSCOPY (EGD) WITH PROPOFOL N/A 11/22/2019   Procedure: ESOPHAGOGASTRODUODENOSCOPY (EGD) WITH PROPOFOL;  Surgeon: Daneil Dolin, MD; 4 columns of grade 2-3 esophageal varices, portal gastropathy, gastric polyp/abnormal gastric mucosa s/p biopsy.  Pathology with hyperplastic polyp, mild chronic gastritis, negative H. pylori.  [name] KNEE ARTHROSCOPY Left    . TEE WITHOUT CARDIOVERSION N/A 08/29/2015   Procedure: TRANSESOPHAGEAL ECHOCARDIOGRAM (TEE);  Surgeon: [name], MD;  Location: [place];  Service: Open Heart Surgery;  Laterality: N/A;  . TOTAL KNEE ARTHROPLASTY Left 03/09/2017   Procedure: TOTAL KNEE ARTHROPLASTY;  Surgeon: Carole Civil, MD;  Location: [facility];  Service: Orthopedics;  Laterality: Left;    Current Outpatient Medications  Medication Sig Dispense Refill  . albuterol (VENTOLIN HFA) 108 (90 Base) MCG/ACT inhaler Inhale 2 puffs into the lungs every 6 (six) hours as needed for wheezing or shortness of breath. 18 g 0  . Armodafinil 200 MG TABS Use as needed for sleepiness, by mouth. 1 tab  before 12 noon. 30 tablet 5  . aspirin EC 81 MG tablet Take 81 mg by mouth at bedtime.     [name] b complex vitamins tablet Take 1 tablet by mouth daily.    [name] Bioflavonoid Products (ESTER C PO) Take 1 capsule by mouth daily.    [name] bismuth subsalicylate (PEPTO BISMOL) 262 MG/15ML suspension Take 30 mLs by mouth every 6 (six) hours as needed.    . Cholecalciferol (DIALYVITE VITAMIN D 5000) 125 MCG (5000 UT) capsule Take 10,000 Units by mouth daily.    [name] (EUCRISA) 2 % OINT Apply 1 application topically 2 (two) times daily as needed. 60 g 5  . diphenhydrAMINE (BENADRYL) 2 % cream Apply 1 application topically 3 (three) times daily as needed for itching.    [name] Bandages & Supports (MEDICAL COMPRESSION STOCKINGS) MISC 1 each by Does not apply route as directed. Low pressure knee high compression stockings Dx: leg swelling 1 each 0  . ELDERBERRY PO Take 1 capsule by mouth daily. With  Vitamin c and zinc    . ferrous sulfate 325 (65 FE) MG tablet Take 325 mg by mouth daily.    . Flaxseed, Linseed, (FLAXSEED OIL PO) Take 1,400 mg by mouth daily.     . furosemide (LASIX) 20 MG tablet Take 4 tablets (80 mg total) by mouth daily. 90 tablet 2  . hydrochlorothiazide (HYDRODIURIL) 25 MG tablet Take 12.5 mg by mouth daily.   3  .  insulin aspart (NOVOLOG) 100 UNIT/ML injection Inject 5-30 Units into the skin 3 (three) times daily before meals.     [name] loratadine (CLARITIN) 10 MG tablet Take 10 mg by mouth daily.    . Magnesium 250 MG TABS Take 250 mg by mouth daily.    . Multiple Vitamins-Minerals (MULTIVITAMIN ADULT PO) Take 1 tablet by mouth daily.    . nadolol (CORGARD) 20 MG tablet TAKE 1 TABLET(20 MG) BY MOUTH DAILY 30 tablet 3  . olmesartan (BENICAR) 40 MG tablet Take 40 mg by mouth daily.     [name] omeprazole (PRILOSEC) 40 MG capsule Take 40 mg by mouth at bedtime.  2  . pravastatin (PRAVACHOL) 20 MG tablet TAKE 1 TABLET(20 MG) BY  MOUTH AT BEDTIME 90 tablet 3  . traMADol-acetaminophen (ULTRACET) 37.5-325 MG tablet TAKE 1 TABLET BY MOUTH EVERY 4 HOURS AS NEEDED 60 tablet 0  . TRESIBA FLEXTOUCH 200 UNIT/ML SOPN Inject 40-70 Units into the skin See admin instructions. Inject 70 units into the skin in the morning and 40 units at bedtime    . TURMERIC CURCUMIN PO Take 2,000 mg by mouth daily.      No current facility-administered medications for this visit.    Allergies:   Patient has no known allergies.    Social History:  The patient  reports that he has never smoked. He has never used smokeless tobacco. He reports that he does not drink alcohol and does not use drugs.   Family History:  The patient's family history includes ALS in his mother; Aneurysm in his maternal grandmother; Arthritis in an other family member; CAD in his brother and father; Cancer in his maternal grandfather and another family member; Diabetes in an other family member; Diabetes Mellitus II in his brother, brother, father, maternal grandmother, and sister; Hodgkin's lymphoma in his father; Liver cancer in his father.  He indicated that his mother is deceased. He indicated that his father is deceased. He indicated that his sister is alive. He indicated that both of his brothers are alive. He indicated that his maternal grandmother is deceased. He  indicated that his maternal grandfather is deceased. He indicated that his paternal grandmother is deceased. He indicated that his paternal grandfather is deceased. He indicated that the status of his neg hx is unknown. He indicated that the status of his other is unknown.    ROS:  Please see the history of present illness.No fevers or chills All other systems are reviewed and negative.    PHYSICAL EXAM: VS:  BP 110/60   Pulse 74   Ht 5' 10.5" (1.791 m)   Wt 300 lb (136.1 kg)   SpO2 97%   BMI 42.44 kg/m  , BMI Body mass index is 42.44 kg/m. GEN: Well nourished, morbidly obese, male in no acute distress HEENT: normal for age  Neck: 10 cm JVD, no carotid bruit, no masses Cardiac: RRR; no murmur, no rubs, or gallops Respiratory: Rales bases bilaterally, normal work of breathing GI: soft, nontender, nondistended, + BS MS: no deformity or atrophy; 2+ lower extremity edema; distal pulses are 2+ in upper extremities  Skin: warm and dry, no rash, some erythema and lesions noted on both lower extremities Neuro:  Strength and sensation are intact Psych: euthymic mood, full affect   EKG:  EKG is not ordered today.   ECHO: 05/26/2020 1. Left ventricular ejection fraction, by estimation, is 60 to 65%. The  left ventricle has normal function. The left ventricle has no regional  wall motion abnormalities. The left ventricular internal cavity size was  mildly dilated. There is mild left  ventricular hypertrophy. Left ventricular diastolic parameters are  indeterminate.  2. Right ventricular systolic function is normal. The right ventricular  size is normal. There is normal pulmonary artery systolic pressure.  3. The mitral valve is abnormal. Mild mitral valve regurgitation.  4. AV is thickened, calcifieid Difficult to see well. Peak and mean  gradients through the valve are 31 and 18 mm Hg respectively consistent  with mild AS. Compared to echo report from 2019, gradients are  increased.  . The aortic valve is abnormal. Aortic  valve regurgitation is not visualized. Mild aortic valve stenosis.  5. The inferior vena cava is dilated  in size with <50% respiratory  variability, suggesting right atrial pressure of 15 mmHg.   CATH: 08/25/2015 pre-CABG 1. Mid RCA to Dist RCA lesion, 100% stenosed. 2. 1st Mrg lesion, 90% stenosed. 3. Ramus lesion, 70% stenosed. 4. Ost Cx to Dist Cx lesion, 60% stenosed. 5. Ost 1st Diag lesion, 90% stenosed. 6. Ost 2nd Diag to 2nd Diag lesion, 70% stenosed. 7. Mid LAD lesion, 90% stenosed. 8. Post Atrio lesion, 90% stenosed. 9. Dist RCA lesion, 100% stenosed.    Severe native vessel coronary artery disease with total occlusion of the distal RCA (likely culprit for acute presentation), high-grade obstruction in the proximal to mid LAD, high-grade obstruction in the first obtuse marginal, significant stenosis in the ramus intermedius, first diagonal, and second diagonal.  LVEF 50% with marked elevation in LVEDP, consistent with acute diastolic left heart failure.  Distal right coronary is collateralized from the LAD and circumflex. The right coronary is dominant.   RECOMMENDATIONS:   TCTS consultation for consideration of surgical revascularization.  Continue aspirin. No Plavix. In TIMI IV heparin.   Recent Labs: 08/31/2019: TSH 2.400 01/09/2020: ALT 20 03/13/2020: BUN 17; Creatinine, Ser 1.45; Potassium 3.6; Sodium 136 03/26/2020: Hemoglobin 11.8; Platelets 66  CBC    Component Value Date/Time   WBC 3.3 (L) 03/26/2020 1217   RBC 3.41 (L) 03/26/2020 1217   RBC 3.54 (L) 03/26/2020 1217   HGB 11.8 (L) 03/26/2020 1217   HGB 12.5 (L) 05/03/2019 1005   HGB 10.1 (L) 12/15/2018 1525   HCT 35.4 (L) 03/26/2020 1217   HCT 30.2 (L) 12/15/2018 1525   PLT 66 (L) 03/26/2020 1217   PLT 72 (L) 05/03/2019 1005   PLT 87 (LL) 12/15/2018 1525   MCV 100.0 03/26/2020 1217   MCV 84 12/15/2018 1525   MCH 33.3 03/26/2020 1217   MCHC 33.3  03/26/2020 1217   RDW 14.3 03/26/2020 1217   RDW 15.1 12/15/2018 1525   LYMPHSABS 1.0 03/26/2020 1217   LYMPHSABS 1.5 12/15/2018 1525   MONOABS 0.3 03/26/2020 1217   EOSABS 0.1 03/26/2020 1217   EOSABS 0.1 12/15/2018 1525   BASOSABS 0.0 03/26/2020 1217   BASOSABS 0.0 12/15/2018 1525   CMP Latest Ref Rng & Units 03/13/2020 01/09/2020 11/20/2019  Glucose 70 - 99 mg/dL 346(H) 239(H) 266(H)  BUN 6 - 20 mg/dL 17 26(H) 27(H)  Creatinine 0.61 - 1.24 mg/dL 1.45(H) 1.40(H) 1.52(H)  Sodium 135 - 145 mmol/L 136 136 135  Potassium 3.5 - 5.1 mmol/L 3.6 4.2 3.8  Chloride 98 - 111 mmol/L 98 104 100  CO2 22 - 32 mmol/L 23 23 24   Calcium 8.9 - 10.3 mg/dL 9.4 9.0 9.2  Total Protein 6.1 - 8.1 g/dL - 6.9 -  Total Bilirubin 0.2 - 1.2 mg/dL - 1.0 -  Alkaline Phos 38 - 126 U/L - - -  AST 10 - 35 U/L - 47(H) -  ALT 9 - 46 U/L - 20 -   Lab Results  Component Value Date   HGBA1C 9.3 (H) 08/24/2015    Lipid Panel Lab Results  Component Value Date   CHOL 120 05/25/2019   HDL 38 (L) 05/25/2019   LDLCALC 55 05/25/2019   TRIG 136 05/25/2019   CHOLHDL 3.2 05/25/2019      Wt Readings from Last 3 Encounters:  05/30/20 300 lb (136.1 kg)  05/13/20 296 lb (134.3 kg)  04/17/20 276 lb 11.2 oz (125.5 kg)     Other studies Reviewed: Additional studies/ records that were reviewed today include: Office  notes, hospital records and testing.  ASSESSMENT AND PLAN:  1.  Acute on chronic diastolic CHF: -His weight has been trending up, it is still up almost 25 pounds in the last 3 weeks -When his diuretic was increased, he lost 12 pounds -There is significant accidental dietary indiscretion and that he was drinking Gatorade every day. -He was given instructions on sodium and fluid restrictions -.  He is to continue daily weights. -Continue Lasix at the current dose for now. -Check a BMET today -MD to review and advise if if he should remain on both the HCTZ and the Lasix  2.  CAD: -He is on aspirin,  nadolol, Pravachol -No ischemic symptoms, continue current therapy  3.  Hypertension: -He restarted the olmesartan, and has continued on the nadolol and the Lasix. -Continue current therapy, blood pressure is well controlled   Current medicines are reviewed at length with the patient today.  The patient does not have concerns regarding medicines.  The following changes have been made:  no change, continue Lasix at 80 mg daily  Labs/ tests ordered today include:  No orders of the defined types were placed in this encounter.    Disposition:   FU with Rozann Lesches, MD  Signed, Rosaria Ferries, PA-C  05/30/2020 1:54 PM    [facility] Phone: [phone]; Fax: [phone]

## 2020-05-30 NOTE — Patient Instructions (Addendum)
WEIGH DAILY, every am, wearing the same amount of clothing Record weights, contact Rozann Lesches, MD for weight gain of 3 lbs in a day or 5 lbs in a week Limit sodium to 500 mg per meal, total 2000 mg per day Limit all liquids to 1.5-2 liters/quarts per day  OK to use Lite-Salt ----------------------------------------------------------------------------------------- Medication Instructions:   Continue the Lasix 80mg  daily for now.   Continue all other medications.    Labwork:  BMET - order given today.   Office will contact with results via phone or letter.    Testing/Procedures: none  Follow-Up: 1 month   Any Other Special Instructions Will Be Listed Below (If Applicable). You have been referred to:  Nutrition   If you need a refill on your cardiac medications before your next appointment, please call your pharmacy.

## 2020-06-02 ENCOUNTER — Telehealth: Payer: Self-pay

## 2020-06-02 DIAGNOSIS — Z79899 Other long term (current) drug therapy: Secondary | ICD-10-CM

## 2020-06-02 NOTE — Telephone Encounter (Signed)
-----   Message from Lonn Georgia, PA-C sent at 05/30/2020  8:12 PM EDT ----- Please let him know that his lab work is okay.  His renal function is about where it has been, not completely normal, but not any worse.  His potassium and other electrolytes are fine.  Continue current therapy and keep follow-up appointments.  Recheck BMET in 2 weeks.  Thank you

## 2020-06-18 DIAGNOSIS — G4733 Obstructive sleep apnea (adult) (pediatric): Secondary | ICD-10-CM | POA: Diagnosis not present

## 2020-06-19 ENCOUNTER — Other Ambulatory Visit: Payer: Self-pay

## 2020-06-19 ENCOUNTER — Ambulatory Visit: Payer: Federal, State, Local not specified - PPO | Admitting: Neurology

## 2020-06-19 ENCOUNTER — Telehealth: Payer: Self-pay | Admitting: Neurology

## 2020-06-19 ENCOUNTER — Inpatient Hospital Stay (HOSPITAL_COMMUNITY): Payer: Federal, State, Local not specified - PPO | Attending: Hematology

## 2020-06-19 DIAGNOSIS — D696 Thrombocytopenia, unspecified: Secondary | ICD-10-CM

## 2020-06-19 DIAGNOSIS — D509 Iron deficiency anemia, unspecified: Secondary | ICD-10-CM | POA: Insufficient documentation

## 2020-06-19 DIAGNOSIS — J069 Acute upper respiratory infection, unspecified: Secondary | ICD-10-CM | POA: Diagnosis not present

## 2020-06-19 LAB — IRON AND TIBC
Iron: 57 ug/dL (ref 45–182)
Saturation Ratios: 20 % (ref 17.9–39.5)
TIBC: 288 ug/dL (ref 250–450)
UIBC: 231 ug/dL

## 2020-06-19 LAB — CBC WITH DIFFERENTIAL/PLATELET
Abs Immature Granulocytes: 0.01 10*3/uL (ref 0.00–0.07)
Basophils Absolute: 0 10*3/uL (ref 0.0–0.1)
Basophils Relative: 0 %
Eosinophils Absolute: 0.2 10*3/uL (ref 0.0–0.5)
Eosinophils Relative: 3 %
HCT: 32.3 % — ABNORMAL LOW (ref 39.0–52.0)
Hemoglobin: 10.5 g/dL — ABNORMAL LOW (ref 13.0–17.0)
Immature Granulocytes: 0 %
Lymphocytes Relative: 18 %
Lymphs Abs: 1 10*3/uL (ref 0.7–4.0)
MCH: 34.9 pg — ABNORMAL HIGH (ref 26.0–34.0)
MCHC: 32.5 g/dL (ref 30.0–36.0)
MCV: 107.3 fL — ABNORMAL HIGH (ref 80.0–100.0)
Monocytes Absolute: 0.6 10*3/uL (ref 0.1–1.0)
Monocytes Relative: 11 %
Neutro Abs: 3.7 10*3/uL (ref 1.7–7.7)
Neutrophils Relative %: 68 %
Platelets: 80 10*3/uL — ABNORMAL LOW (ref 150–400)
RBC: 3.01 MIL/uL — ABNORMAL LOW (ref 4.22–5.81)
RDW: 14.5 % (ref 11.5–15.5)
WBC: 5.4 10*3/uL (ref 4.0–10.5)
nRBC: 0 % (ref 0.0–0.2)

## 2020-06-19 LAB — COMPREHENSIVE METABOLIC PANEL
ALT: 11 U/L (ref 0–44)
AST: 28 U/L (ref 15–41)
Albumin: 3.9 g/dL (ref 3.5–5.0)
Alkaline Phosphatase: 47 U/L (ref 38–126)
Anion gap: 12 (ref 5–15)
BUN: 61 mg/dL — ABNORMAL HIGH (ref 6–20)
CO2: 21 mmol/L — ABNORMAL LOW (ref 22–32)
Calcium: 9 mg/dL (ref 8.9–10.3)
Chloride: 109 mmol/L (ref 98–111)
Creatinine, Ser: 2.08 mg/dL — ABNORMAL HIGH (ref 0.61–1.24)
GFR calc Af Amer: 39 mL/min — ABNORMAL LOW (ref >60)
GFR calc non Af Amer: 34 mL/min — ABNORMAL LOW (ref >60)
Glucose, Bld: 64 mg/dL — ABNORMAL LOW (ref 70–99)
Potassium: 4.7 mmol/L (ref 3.5–5.1)
Sodium: 142 mmol/L (ref 135–145)
Total Bilirubin: 1.1 mg/dL (ref 0.3–1.2)
Total Protein: 7.4 g/dL (ref 6.5–8.1)

## 2020-06-19 LAB — FERRITIN: Ferritin: 208 ng/mL (ref 24–336)

## 2020-06-19 NOTE — Telephone Encounter (Signed)
Pt was a no show to the follow up apt that was scheduled for today.

## 2020-06-20 ENCOUNTER — Inpatient Hospital Stay (HOSPITAL_COMMUNITY)
Admission: AD | Admit: 2020-06-20 | Discharge: 2020-07-01 | DRG: 441 | Disposition: A | Discharge status: Home Health | Payer: Federal, State, Local not specified - PPO | Source: Ambulatory Visit | Attending: Internal Medicine | Admitting: Internal Medicine

## 2020-06-20 ENCOUNTER — Encounter: Payer: Self-pay | Admitting: Cardiology

## 2020-06-20 ENCOUNTER — Inpatient Hospital Stay (HOSPITAL_COMMUNITY): Payer: Federal, State, Local not specified - PPO

## 2020-06-20 ENCOUNTER — Other Ambulatory Visit: Payer: Self-pay

## 2020-06-20 ENCOUNTER — Ambulatory Visit (INDEPENDENT_AMBULATORY_CARE_PROVIDER_SITE_OTHER): Payer: Federal, State, Local not specified - PPO | Admitting: Cardiology

## 2020-06-20 ENCOUNTER — Emergency Department (HOSPITAL_COMMUNITY)
Admission: EM | Admit: 2020-06-20 | Discharge: 2020-06-20 | Discharge status: Absent without leave | Payer: Federal, State, Local not specified - PPO

## 2020-06-20 VITALS — BP 130/60 | HR 76 | Ht 70.5 in | Wt 319.4 lb

## 2020-06-20 DIAGNOSIS — E1165 Type 2 diabetes mellitus with hyperglycemia: Secondary | ICD-10-CM | POA: Diagnosis not present

## 2020-06-20 DIAGNOSIS — I25119 Atherosclerotic heart disease of native coronary artery with unspecified angina pectoris: Secondary | ICD-10-CM

## 2020-06-20 DIAGNOSIS — Z833 Family history of diabetes mellitus: Secondary | ICD-10-CM

## 2020-06-20 DIAGNOSIS — K7469 Other cirrhosis of liver: Secondary | ICD-10-CM

## 2020-06-20 DIAGNOSIS — K317 Polyp of stomach and duodenum: Secondary | ICD-10-CM | POA: Diagnosis present

## 2020-06-20 DIAGNOSIS — Z6841 Body Mass Index (BMI) 40.0 and over, adult: Secondary | ICD-10-CM | POA: Diagnosis not present

## 2020-06-20 DIAGNOSIS — Z23 Encounter for immunization: Secondary | ICD-10-CM | POA: Diagnosis not present

## 2020-06-20 DIAGNOSIS — I8510 Secondary esophageal varices without bleeding: Secondary | ICD-10-CM | POA: Diagnosis not present

## 2020-06-20 DIAGNOSIS — Z7982 Long term (current) use of aspirin: Secondary | ICD-10-CM

## 2020-06-20 DIAGNOSIS — R601 Generalized edema: Secondary | ICD-10-CM | POA: Diagnosis not present

## 2020-06-20 DIAGNOSIS — Z807 Family history of other malignant neoplasms of lymphoid, hematopoietic and related tissues: Secondary | ICD-10-CM

## 2020-06-20 DIAGNOSIS — D61818 Other pancytopenia: Secondary | ICD-10-CM | POA: Diagnosis present

## 2020-06-20 DIAGNOSIS — I2510 Atherosclerotic heart disease of native coronary artery without angina pectoris: Secondary | ICD-10-CM | POA: Diagnosis not present

## 2020-06-20 DIAGNOSIS — D890 Polyclonal hypergammaglobulinemia: Secondary | ICD-10-CM | POA: Diagnosis present

## 2020-06-20 DIAGNOSIS — D684 Acquired coagulation factor deficiency: Secondary | ICD-10-CM | POA: Diagnosis not present

## 2020-06-20 DIAGNOSIS — R188 Other ascites: Secondary | ICD-10-CM | POA: Diagnosis present

## 2020-06-20 DIAGNOSIS — J90 Pleural effusion, not elsewhere classified: Secondary | ICD-10-CM | POA: Diagnosis not present

## 2020-06-20 DIAGNOSIS — I6523 Occlusion and stenosis of bilateral carotid arteries: Secondary | ICD-10-CM

## 2020-06-20 DIAGNOSIS — R278 Other lack of coordination: Secondary | ICD-10-CM | POA: Diagnosis present

## 2020-06-20 DIAGNOSIS — Z20822 Contact with and (suspected) exposure to covid-19: Secondary | ICD-10-CM | POA: Diagnosis present

## 2020-06-20 DIAGNOSIS — K7581 Nonalcoholic steatohepatitis (NASH): Secondary | ICD-10-CM | POA: Diagnosis not present

## 2020-06-20 DIAGNOSIS — E1122 Type 2 diabetes mellitus with diabetic chronic kidney disease: Secondary | ICD-10-CM | POA: Diagnosis present

## 2020-06-20 DIAGNOSIS — Z800 Family history of malignant neoplasm of digestive organs: Secondary | ICD-10-CM

## 2020-06-20 DIAGNOSIS — I2583 Coronary atherosclerosis due to lipid rich plaque: Secondary | ICD-10-CM | POA: Diagnosis not present

## 2020-06-20 DIAGNOSIS — Z8261 Family history of arthritis: Secondary | ICD-10-CM

## 2020-06-20 DIAGNOSIS — D631 Anemia in chronic kidney disease: Secondary | ICD-10-CM | POA: Diagnosis present

## 2020-06-20 DIAGNOSIS — E66813 Obesity, class 3: Secondary | ICD-10-CM

## 2020-06-20 DIAGNOSIS — D509 Iron deficiency anemia, unspecified: Secondary | ICD-10-CM | POA: Diagnosis present

## 2020-06-20 DIAGNOSIS — Z9989 Dependence on other enabling machines and devices: Secondary | ICD-10-CM | POA: Diagnosis not present

## 2020-06-20 DIAGNOSIS — Z951 Presence of aortocoronary bypass graft: Secondary | ICD-10-CM | POA: Diagnosis not present

## 2020-06-20 DIAGNOSIS — K7689 Other specified diseases of liver: Secondary | ICD-10-CM | POA: Diagnosis not present

## 2020-06-20 DIAGNOSIS — I10 Essential (primary) hypertension: Secondary | ICD-10-CM | POA: Diagnosis not present

## 2020-06-20 DIAGNOSIS — I350 Nonrheumatic aortic (valve) stenosis: Secondary | ICD-10-CM

## 2020-06-20 DIAGNOSIS — K8020 Calculus of gallbladder without cholecystitis without obstruction: Secondary | ICD-10-CM | POA: Diagnosis present

## 2020-06-20 DIAGNOSIS — I5032 Chronic diastolic (congestive) heart failure: Secondary | ICD-10-CM

## 2020-06-20 DIAGNOSIS — K529 Noninfective gastroenteritis and colitis, unspecified: Secondary | ICD-10-CM | POA: Diagnosis present

## 2020-06-20 DIAGNOSIS — Z79899 Other long term (current) drug therapy: Secondary | ICD-10-CM

## 2020-06-20 DIAGNOSIS — Z8249 Family history of ischemic heart disease and other diseases of the circulatory system: Secondary | ICD-10-CM

## 2020-06-20 DIAGNOSIS — N179 Acute kidney failure, unspecified: Secondary | ICD-10-CM | POA: Diagnosis not present

## 2020-06-20 DIAGNOSIS — E119 Type 2 diabetes mellitus without complications: Secondary | ICD-10-CM | POA: Diagnosis present

## 2020-06-20 DIAGNOSIS — E785 Hyperlipidemia, unspecified: Secondary | ICD-10-CM | POA: Diagnosis present

## 2020-06-20 DIAGNOSIS — K766 Portal hypertension: Secondary | ICD-10-CM | POA: Diagnosis not present

## 2020-06-20 DIAGNOSIS — N183 Chronic kidney disease, stage 3 unspecified: Secondary | ICD-10-CM | POA: Diagnosis not present

## 2020-06-20 DIAGNOSIS — IMO0002 Reserved for concepts with insufficient information to code with codable children: Secondary | ICD-10-CM | POA: Diagnosis present

## 2020-06-20 DIAGNOSIS — N5089 Other specified disorders of the male genital organs: Secondary | ICD-10-CM | POA: Diagnosis present

## 2020-06-20 DIAGNOSIS — K219 Gastro-esophageal reflux disease without esophagitis: Secondary | ICD-10-CM | POA: Diagnosis present

## 2020-06-20 DIAGNOSIS — I130 Hypertensive heart and chronic kidney disease with heart failure and stage 1 through stage 4 chronic kidney disease, or unspecified chronic kidney disease: Secondary | ICD-10-CM | POA: Diagnosis present

## 2020-06-20 DIAGNOSIS — I509 Heart failure, unspecified: Secondary | ICD-10-CM | POA: Diagnosis not present

## 2020-06-20 DIAGNOSIS — I5033 Acute on chronic diastolic (congestive) heart failure: Secondary | ICD-10-CM | POA: Diagnosis not present

## 2020-06-20 DIAGNOSIS — R059 Cough, unspecified: Secondary | ICD-10-CM

## 2020-06-20 DIAGNOSIS — K7290 Hepatic failure, unspecified without coma: Secondary | ICD-10-CM | POA: Diagnosis present

## 2020-06-20 DIAGNOSIS — R531 Weakness: Secondary | ICD-10-CM | POA: Diagnosis present

## 2020-06-20 DIAGNOSIS — R161 Splenomegaly, not elsewhere classified: Secondary | ICD-10-CM | POA: Diagnosis present

## 2020-06-20 DIAGNOSIS — Z794 Long term (current) use of insulin: Secondary | ICD-10-CM

## 2020-06-20 DIAGNOSIS — N1832 Chronic kidney disease, stage 3b: Secondary | ICD-10-CM | POA: Diagnosis present

## 2020-06-20 DIAGNOSIS — D539 Nutritional anemia, unspecified: Secondary | ICD-10-CM | POA: Diagnosis present

## 2020-06-20 DIAGNOSIS — G4733 Obstructive sleep apnea (adult) (pediatric): Secondary | ICD-10-CM | POA: Diagnosis not present

## 2020-06-20 DIAGNOSIS — D649 Anemia, unspecified: Secondary | ICD-10-CM | POA: Diagnosis not present

## 2020-06-20 DIAGNOSIS — K828 Other specified diseases of gallbladder: Secondary | ICD-10-CM | POA: Diagnosis not present

## 2020-06-20 DIAGNOSIS — K7460 Unspecified cirrhosis of liver: Secondary | ICD-10-CM

## 2020-06-20 DIAGNOSIS — Z96652 Presence of left artificial knee joint: Secondary | ICD-10-CM | POA: Diagnosis present

## 2020-06-20 DIAGNOSIS — N1831 Chronic kidney disease, stage 3a: Secondary | ICD-10-CM | POA: Diagnosis not present

## 2020-06-20 DIAGNOSIS — E876 Hypokalemia: Secondary | ICD-10-CM | POA: Diagnosis not present

## 2020-06-20 DIAGNOSIS — R32 Unspecified urinary incontinence: Secondary | ICD-10-CM | POA: Diagnosis not present

## 2020-06-20 DIAGNOSIS — K2950 Unspecified chronic gastritis without bleeding: Secondary | ICD-10-CM | POA: Diagnosis present

## 2020-06-20 LAB — BRAIN NATRIURETIC PEPTIDE: B Natriuretic Peptide: 601 pg/mL — ABNORMAL HIGH (ref 0.0–100.0)

## 2020-06-20 LAB — HEPATIC FUNCTION PANEL
ALT: 11 U/L (ref 0–44)
AST: 27 U/L (ref 15–41)
Albumin: 3.6 g/dL (ref 3.5–5.0)
Alkaline Phosphatase: 45 U/L (ref 38–126)
Bilirubin, Direct: 0.2 mg/dL (ref 0.0–0.2)
Indirect Bilirubin: 0.8 mg/dL (ref 0.3–0.9)
Total Bilirubin: 1 mg/dL (ref 0.3–1.2)
Total Protein: 7.1 g/dL (ref 6.5–8.1)

## 2020-06-20 LAB — BASIC METABOLIC PANEL
Anion gap: 6 (ref 5–15)
BUN: 59 mg/dL — ABNORMAL HIGH (ref 6–20)
CO2: 24 mmol/L (ref 22–32)
Calcium: 8.8 mg/dL — ABNORMAL LOW (ref 8.9–10.3)
Chloride: 112 mmol/L — ABNORMAL HIGH (ref 98–111)
Creatinine, Ser: 2.11 mg/dL — ABNORMAL HIGH (ref 0.61–1.24)
GFR calc Af Amer: 38 mL/min — ABNORMAL LOW (ref >60)
GFR calc non Af Amer: 33 mL/min — ABNORMAL LOW (ref >60)
Glucose, Bld: 120 mg/dL — ABNORMAL HIGH (ref 70–99)
Potassium: 4.8 mmol/L (ref 3.5–5.1)
Sodium: 142 mmol/L (ref 135–145)

## 2020-06-20 LAB — CBC
HCT: 31 % — ABNORMAL LOW (ref 39.0–52.0)
Hemoglobin: 9.8 g/dL — ABNORMAL LOW (ref 13.0–17.0)
MCH: 34.6 pg — ABNORMAL HIGH (ref 26.0–34.0)
MCHC: 31.6 g/dL (ref 30.0–36.0)
MCV: 109.5 fL — ABNORMAL HIGH (ref 80.0–100.0)
Platelets: 63 10*3/uL — ABNORMAL LOW (ref 150–400)
RBC: 2.83 MIL/uL — ABNORMAL LOW (ref 4.22–5.81)
RDW: 14.6 % (ref 11.5–15.5)
WBC: 3.6 10*3/uL — ABNORMAL LOW (ref 4.0–10.5)
nRBC: 0 % (ref 0.0–0.2)

## 2020-06-20 LAB — GLUCOSE, CAPILLARY
Glucose-Capillary: 132 mg/dL — ABNORMAL HIGH (ref 70–99)
Glucose-Capillary: 111 mg/dL — ABNORMAL HIGH (ref 70–99)

## 2020-06-20 LAB — PROTIME-INR
INR: 1.4 — ABNORMAL HIGH (ref 0.8–1.2)
Prothrombin Time: 16.2 seconds — ABNORMAL HIGH (ref 11.4–15.2)

## 2020-06-20 LAB — SARS CORONAVIRUS 2 BY RT PCR (HOSPITAL ORDER, PERFORMED IN ~~LOC~~ HOSPITAL LAB): SARS Coronavirus 2: NEGATIVE

## 2020-06-20 LAB — AMMONIA: Ammonia: 68 umol/L — ABNORMAL HIGH (ref 9–35)

## 2020-06-20 MED ORDER — INSULIN DETEMIR 100 UNIT/ML ~~LOC~~ SOLN
20.0000 [IU] | Freq: Every day | SUBCUTANEOUS | Status: DC
  Administered 2020-06-21 – 2020-06-23 (×3): 20 [IU] via SUBCUTANEOUS
  Filled 2020-06-20 (×3): qty 0.2
  Filled 2020-06-20: qty 1
  Filled 2020-06-20 (×2): qty 0.2

## 2020-06-20 MED ORDER — ASPIRIN EC 81 MG PO TBEC
81.0000 mg | DELAYED_RELEASE_TABLET | Freq: Every day | ORAL | Status: DC
  Administered 2020-06-20 – 2020-06-30 (×11): 81 mg via ORAL
  Filled 2020-06-20 (×11): qty 1

## 2020-06-20 MED ORDER — SPIRONOLACTONE 25 MG PO TABS
25.0000 mg | ORAL_TABLET | Freq: Every day | ORAL | Status: DC
  Administered 2020-06-20 – 2020-06-21 (×2): 25 mg via ORAL
  Filled 2020-06-20 (×2): qty 1

## 2020-06-20 MED ORDER — PANTOPRAZOLE SODIUM 40 MG PO TBEC
40.0000 mg | DELAYED_RELEASE_TABLET | Freq: Every day | ORAL | Status: DC
  Administered 2020-06-20 – 2020-07-01 (×12): 40 mg via ORAL
  Filled 2020-06-20 (×12): qty 1

## 2020-06-20 MED ORDER — LORATADINE 10 MG PO TABS
10.0000 mg | ORAL_TABLET | Freq: Every morning | ORAL | Status: DC
  Administered 2020-06-21 – 2020-07-01 (×11): 10 mg via ORAL
  Filled 2020-06-20 (×11): qty 1

## 2020-06-20 MED ORDER — VITAMIN D 25 MCG (1000 UNIT) PO TABS
5000.0000 [IU] | ORAL_TABLET | Freq: Every morning | ORAL | Status: DC
  Administered 2020-06-21 – 2020-07-01 (×11): 5000 [IU] via ORAL
  Filled 2020-06-20 (×11): qty 5

## 2020-06-20 MED ORDER — NADOLOL 20 MG PO TABS
20.0000 mg | ORAL_TABLET | Freq: Every morning | ORAL | Status: DC
  Administered 2020-06-21 – 2020-07-01 (×11): 20 mg via ORAL
  Filled 2020-06-20 (×12): qty 1

## 2020-06-20 MED ORDER — FUROSEMIDE 10 MG/ML IJ SOLN
40.0000 mg | Freq: Two times a day (BID) | INTRAMUSCULAR | Status: DC
  Administered 2020-06-20 – 2020-06-23 (×6): 40 mg via INTRAVENOUS
  Filled 2020-06-20 (×6): qty 4

## 2020-06-20 MED ORDER — PRAVASTATIN SODIUM 10 MG PO TABS
20.0000 mg | ORAL_TABLET | Freq: Every day | ORAL | Status: DC
  Administered 2020-06-20 – 2020-06-30 (×11): 20 mg via ORAL
  Filled 2020-06-20 (×11): qty 2

## 2020-06-20 MED ORDER — INSULIN ASPART 100 UNIT/ML ~~LOC~~ SOLN
0.0000 [IU] | Freq: Three times a day (TID) | SUBCUTANEOUS | Status: DC
  Administered 2020-06-21: 3 [IU] via SUBCUTANEOUS
  Administered 2020-06-22 – 2020-06-25 (×4): 2 [IU] via SUBCUTANEOUS
  Administered 2020-06-25 – 2020-06-26 (×2): 3 [IU] via SUBCUTANEOUS
  Administered 2020-06-27: 2 [IU] via SUBCUTANEOUS
  Administered 2020-06-27: 5 [IU] via SUBCUTANEOUS
  Administered 2020-06-27: 3 [IU] via SUBCUTANEOUS
  Administered 2020-06-28: 5 [IU] via SUBCUTANEOUS
  Administered 2020-06-28 – 2020-06-30 (×4): 3 [IU] via SUBCUTANEOUS
  Administered 2020-07-01: 2 [IU] via SUBCUTANEOUS
  Administered 2020-07-01: 5 [IU] via SUBCUTANEOUS

## 2020-06-20 MED ORDER — INSULIN DEGLUDEC 200 UNIT/ML ~~LOC~~ SOPN: 40.0000 [IU] | PEN_INJECTOR | SUBCUTANEOUS | Status: DC

## 2020-06-20 MED ORDER — HEPARIN SODIUM (PORCINE) 5000 UNIT/ML IJ SOLN: 5000.0000 [IU] | Freq: Three times a day (TID) | INTRAMUSCULAR | Status: DC

## 2020-06-20 MED ORDER — INSULIN DETEMIR 100 UNIT/ML ~~LOC~~ SOLN
40.0000 [IU] | Freq: Every day | SUBCUTANEOUS | Status: DC
  Administered 2020-06-21 – 2020-06-24 (×4): 40 [IU] via SUBCUTANEOUS
  Filled 2020-06-20 (×6): qty 0.4

## 2020-06-20 MED ORDER — ALBUTEROL SULFATE (2.5 MG/3ML) 0.083% IN NEBU
2.5000 mg | INHALATION_SOLUTION | Freq: Four times a day (QID) | RESPIRATORY_TRACT | Status: DC | PRN
  Administered 2020-06-20: 2.5 mg via RESPIRATORY_TRACT
  Filled 2020-06-20 (×2): qty 3

## 2020-06-20 MED ORDER — PNEUMOCOCCAL VAC POLYVALENT 25 MCG/0.5ML IJ INJ
0.5000 mL | INJECTION | INTRAMUSCULAR | Status: AC
  Administered 2020-06-21: 0.5 mL via INTRAMUSCULAR
  Filled 2020-06-20: qty 0.5

## 2020-06-20 MED ORDER — ALBUTEROL SULFATE HFA 108 (90 BASE) MCG/ACT IN AERS: 2.0000 | INHALATION_SPRAY | Freq: Four times a day (QID) | RESPIRATORY_TRACT | Status: DC | PRN

## 2020-06-20 MED ORDER — FERROUS SULFATE 325 (65 FE) MG PO TABS
325.0000 mg | ORAL_TABLET | Freq: Every morning | ORAL | Status: DC
  Administered 2020-06-21 – 2020-07-01 (×11): 325 mg via ORAL
  Filled 2020-06-20 (×11): qty 1

## 2020-06-20 MED ORDER — INSULIN ASPART 100 UNIT/ML ~~LOC~~ SOLN
0.0000 [IU] | Freq: Every day | SUBCUTANEOUS | Status: DC
  Administered 2020-06-21: 2 [IU] via SUBCUTANEOUS

## 2020-06-20 MED ORDER — TRAMADOL-ACETAMINOPHEN 37.5-325 MG PO TABS: 1.0000 | ORAL_TABLET | ORAL | Status: DC | PRN

## 2020-06-20 NOTE — Patient Instructions (Signed)
Medication Instructions:  *If you need a refill on your cardiac medications before your next appointment, please call your pharmacy*  Follow-Up: At [facility], you and your health needs are our priority.  As part of our continuing mission to provide you with exceptional heart care, we have created designated Provider Care Teams.  These Care Teams include your primary Cardiologist (physician) and Advanced Practice Providers (APPs -  Physician Assistants and Nurse Practitioners) who all work together to provide you with the care you need, when you need it.  We recommend signing up for the patient portal called "MyChart".  Sign up information is provided on this After Visit Summary.  MyChart is used to connect with patients for Virtual Visits (Telemedicine).  Patients are able to view lab/test results, encounter notes, upcoming appointments, etc.  Non-urgent messages can be sent to your provider as well.   To learn more about what you can do with MyChart, go to [email or web address].     -- Please proceed to [facility] Emergency Room for admission and evaluation --

## 2020-06-20 NOTE — Progress Notes (Addendum)
Notified MD of current BP 88/33 and repeat 85/34 with XL cuff at Left brachial. Rechecked with smaller cuff at Left radial 119/71.

## 2020-06-20 NOTE — H&P (Signed)
TRH H&P   Patient Demographics:    [name], is a 59 y.o. male  MRN: [id number]   DOB - Nov 08, 1961  Admit Date - 06/20/2020  Outpatient Primary MD for the patient is [name], MD  Referring MD/NP/PA: Direct admission from Cards  Office, Dr Domenic Polite  Outpatient Specialists: GI Dr Gala Romney, Cardiology Dr Domenic Polite  Patient coming from: Cardiolgoy office  No chief complaint on file.     HPI:    [name]  is a 59 y.o. male, past medical history of chronic diastolic CHF, who does report worsening overload status, was recently seen by cardiology in June, where they have increased his Lasix to 80 mg oral daily, despite that he continues to have worsening volume overload, and increased weight gain, worsening lower extremity edema up to the thigh, scrotal edema, increased abdominal girth as well, does report some exertional dyspnea as well, he does report some cough and congestion recently started on p.o. antibiotics by PCP which he did not start taking it, he denies any chest pain, but he does report dyspnea, mainly exertional, he was seen today by cardiology for a follow-up, where his creatinine was noted to have increased to 2.08, he was significantly volume overloaded, where he gained 60 pounds since April, as well patient with abdominal ultrasound this February which suggesting cirrhosis, slightly nodular contour, as well he was noted to have thrombocytopenia, will you is following with oncology Dr. Delton Coombes regarding that, direct admission was requested by cardiology for IV diuresis.    Review of systems:    In addition to the HPI above,  No Fever-chills, does report volume overload, 60 pound fluid weight gain over last 34-month. No Headache, No changes with Vision or hearing, No problems swallowing food or Liquids, No Chest pain, does report exertional dyspnea, and  cough No Abdominal pain, No Nausea or Vommitting, Bowel movements are regular, No Blood in stool or Urine, No dysuria, No new skin rashes or bruises, No new joints pains-aches,  No new weakness, tingling, numbness in any extremity, No recent weight gain or loss, No polyuria, polydypsia or polyphagia, No significant Mental Stressors.  A full 10 point Review of Systems was done, except as stated above, all other Review of Systems were negative.   With Past History of the following :    Past Medical History:  Diagnosis Date  . Anemia   . Arthritis   . CAD (coronary artery disease)    Multivessel disease status post CABG 08/2015  . CKD (chronic kidney disease) stage 3, GFR 30-59 ml/min   . Essential hypertension   . Hyperlipidemia   . OSA on CPAP   . Polyclonal gammopathy   . Thrombocytopenia ([place]) 2016  . Type 2 diabetes mellitus ([place])       Past Surgical History:  Procedure Laterality Date  . APPENDECTOMY    . Biceps tendon surgery Right   .  BIOPSY  11/22/2019   Procedure: BIOPSY;  Surgeon: Daneil Dolin, MD;  Location: [facility];  Service: Endoscopy;;  . CARDIAC CATHETERIZATION N/A 08/25/2015   Procedure: Left Heart Cath and Coronary Angiography;  Surgeon: Belva Crome, MD;  Location: [place] [facility];  Service: Cardiovascular;  Laterality: N/A;  . COLONOSCOPY WITH PROPOFOL N/A 11/22/2019   Procedure: COLONOSCOPY WITH PROPOFOL;  Surgeon: Daneil Dolin, MD; Four 4-5 mm polyps, findings suggestive of portal colopathy, congested appearing colonic mucosa diffusely, rectal varices, and adequate right colon prep.  Pathology with tubular adenomas and hyperplastic polyp.  Right colon biopsy with focal active colitis.  Recommendations to repeat colonoscopy in 3 months due to poor prep.  . COLONOSCOPY WITH PROPOFOL N/A 03/17/2020   Procedure: COLONOSCOPY WITH PROPOFOL;  Surgeon: Daneil Dolin, MD;  Location: [facility];  Service: Endoscopy;  Laterality: N/A;  8:45am - pt  does not need covid test, was + 2/4 <90 days  . CORONARY ARTERY BYPASS GRAFT N/A 08/29/2015   Procedure: CORONARY ARTERY BYPASS GRAFTING (CABG);  Surgeon: [name], MD;  Location: [facility];  Service: Open Heart Surgery;  Laterality: N/A;  . ESOPHAGOGASTRODUODENOSCOPY (EGD) WITH PROPOFOL N/A 11/22/2019   Procedure: ESOPHAGOGASTRODUODENOSCOPY (EGD) WITH PROPOFOL;  Surgeon: Daneil Dolin, MD; 4 columns of grade 2-3 esophageal varices, portal gastropathy, gastric polyp/abnormal gastric mucosa s/p biopsy.  Pathology with hyperplastic polyp, mild chronic gastritis, negative H. pylori.  [name] KNEE ARTHROSCOPY Left   . TEE WITHOUT CARDIOVERSION N/A 08/29/2015   Procedure: TRANSESOPHAGEAL ECHOCARDIOGRAM (TEE);  Surgeon: [name], MD;  Location: [facility];  Service: Open Heart Surgery;  Laterality: N/A;  . TOTAL KNEE ARTHROPLASTY Left 03/09/2017   Procedure: TOTAL KNEE ARTHROPLASTY;  Surgeon: Carole Civil, MD;  Location: [facility];  Service: Orthopedics;  Laterality: Left;      Social History:     Social History   Tobacco Use  . Smoking status: Never Smoker  . Smokeless tobacco: Never Used  Substance Use Topics  . Alcohol use: No    Alcohol/week: 0.0 standard drinks      Family History :     Family History  Problem Relation Age of Onset  . Arthritis Other   . Cancer Other   . Diabetes Other   . CAD Father   . Diabetes Mellitus II Father   . Liver cancer Father   . Hodgkin's lymphoma Father   . CAD Brother   . Diabetes Mellitus II Brother   . ALS Mother   . Diabetes Mellitus II Sister   . Diabetes Mellitus II Brother   . Diabetes Mellitus II Maternal Grandmother   . Aneurysm Maternal Grandmother   . Cancer Maternal Grandfather   . Anesthesia problems Neg Hx   . Hypotension Neg Hx   . Malignant hyperthermia Neg Hx   . Pseudochol deficiency Neg Hx   . Colon cancer Neg Hx      Home Medications:   Prior to Admission medications   Medication Sig Start Date End  Date Taking? Authorizing Provider  albuterol (VENTOLIN HFA) 108 (90 Base) MCG/ACT inhaler Inhale 2 puffs into the lungs every 6 (six) hours as needed for wheezing or shortness of breath. 06/11/19  Yes Padgett, Rae Halsted, MD  Armodafinil 200 MG TABS Use as needed for sleepiness, by mouth. 1 tab  before 12 noon. Patient taking differently: Take 200 mg by mouth daily as needed (for sleepiness).  06/19/19  Yes Dohmeier, Asencion Partridge, MD  aspirin EC 81 MG tablet Take 81 mg by mouth at bedtime.    Yes [provider]  b complex vitamins tablet Take 1 tablet by mouth in the morning.    Yes [provider]  Bioflavonoid Products (ESTER C PO) Take 1 capsule by mouth in the morning.    Yes [provider]  Cholecalciferol (DIALYVITE VITAMIN D 5000) 125 MCG (5000 UT) capsule Take 5,000 Units by mouth in the morning.    Yes [provider]  Elastic Bandages & Supports ([place]) [place] 1 each by Does not apply route as directed. Low pressure knee high compression stockings Dx: leg swelling 05/13/20  Yes Satira Sark, MD  ELDERBERRY PO Take 1 capsule by mouth at bedtime. With  Vitamin c and zinc    Yes [provider]  ferrous sulfate 325 (65 FE) MG tablet Take 325 mg by mouth in the morning.    Yes [provider]  Flaxseed, Linseed, (FLAXSEED OIL PO) Take 1,400 mg by mouth at bedtime.    Yes [provider]  furosemide (LASIX) 20 MG tablet Take 4 tablets (80 mg total) by mouth daily. Patient taking differently: Take 80 mg by mouth in the morning.  05/23/20  Yes Satira Sark, MD  hydrochlorothiazide (HYDRODIURIL) 25 MG tablet Take 12.5 mg by mouth in the morning.  01/21/18  Yes [provider]  insulin aspart (NOVOLOG) 100 UNIT/ML injection Inject 5-30 Units into the skin 3 (three) times daily before meals.    Yes [provider]  loratadine (CLARITIN) 10 MG tablet Take 10 mg by mouth in the morning.    Yes  [provider]  Magnesium 250 MG TABS Take 250 mg by mouth daily.   Yes [provider]  Multiple Vitamins-Minerals (MULTIVITAMIN ADULT PO) Take 1 tablet by mouth in the morning.    Yes [provider]  nadolol (CORGARD) 20 MG tablet TAKE 1 TABLET(20 MG) BY MOUTH DAILY Patient taking differently: Take 20 mg by mouth in the morning.  05/23/20  Yes Carlis Stable, NP  olmesartan (BENICAR) 40 MG tablet Take 40 mg by mouth in the morning.  04/14/19  Yes [provider]  omeprazole (PRILOSEC) 40 MG capsule Take 40 mg by mouth at bedtime. 09/13/18  Yes [provider]  pravastatin (PRAVACHOL) 20 MG tablet TAKE 1 TABLET(20 MG) BY MOUTH AT BEDTIME Patient taking differently: Take 20 mg by mouth at bedtime. TAKE 1 TABLET(20 MG) BY MOUTH AT BEDTIME 01/10/20  Yes Satira Sark, MD  traMADol-acetaminophen (ULTRACET) 37.5-325 MG tablet TAKE 1 TABLET BY MOUTH EVERY 4 HOURS AS NEEDED Patient taking differently: Take 1-2 tablets by mouth every 4 (four) hours as needed for moderate pain or severe pain.  04/28/20  Yes Carole Civil, MD  TRESIBA FLEXTOUCH 200 UNIT/ML SOPN Inject 40-70 Units into the skin See admin instructions. Inject 70 units into the skin in the morning and 40 units at bedtime 01/02/18  Yes [provider]  TURMERIC CURCUMIN PO Take 2,000 mg by mouth at bedtime.    Yes [provider]     Allergies:    No Known Allergies   Physical Exam:   Vitals  Blood pressure 96/60, pulse 66, temperature 97.9 F (36.6 C), temperature source Oral, resp. rate 20, height 5' 10.5" (1.791 m), weight (!) 144.9 kg.   1. General  Obese male lying in bed in NAD,    2. Normal affect and insight, Not  Suicidal or Homicidal, Awake Alert, Oriented X 3.  3. No F.N deficits, ALL C.Nerves Intact, Strength 5/5 all 4 extremities, Sensation intact all 4 extremities, Plantars down going.  4. Ears and Eyes appear Normal, Conjunctivae clear, PERRLA. Moist  Oral Mucosa.  5. Supple Neck, No JVD, No cervical lymphadenopathy appriciated, No Carotid Bruits.  6. Symmetrical Chest wall movement, Good air movement bilaterally, CTAB.  7. RRR, No Gallops, Rubs or Murmurs, No Parasternal Heave.  8. Positive Bowel Sounds, patient with increased abdominal girth, no ascites wave could be appreciated well, no rebound, no guarding, no tenderness.    9.  No Cyanosis, Normal Skin Turgor, No Skin Rash or Bruise.  10. Good muscle tone,  joints appear normal , no effusions, Normal ROM.  Patient was +3 edema all the way to the thigh, he has some scrotal edema as well.  11. No Palpable Lymph Nodes in Neck or Axillae     Data Review:    CBC Recent Labs  Lab 06/19/20 1325 06/20/20 1800  WBC 5.4 3.6*  HGB 10.5* 9.8*  HCT 32.3* 31.0*  PLT 80* 63*  MCV 107.3* 109.5*  MCH 34.9* 34.6*  MCHC 32.5 31.6  RDW 14.5 14.6  LYMPHSABS 1.0  --   MONOABS 0.6  --   EOSABS 0.2  --   BASOSABS 0.0  --    ------------------------------------------------------------------------------------------------------------------  Chemistries  Recent Labs  Lab 06/19/20 1325 06/20/20 1800  NA 142 142  K 4.7 4.8  CL 109 112*  CO2 21* 24  GLUCOSE 64* 120*  BUN 61* 59*  CREATININE 2.08* 2.11*  CALCIUM 9.0 8.8*  AST 28 27  ALT 11 11  ALKPHOS 47 45  BILITOT 1.1 1.0   ------------------------------------------------------------------------------------------------------------------ estimated creatinine clearance is 54 mL/min (A) (by C-G formula based on SCr of 2.11 mg/dL (H)). ------------------------------------------------------------------------------------------------------------------ No results for input(s): TSH, T4TOTAL, T3FREE, THYROIDAB in the last 72 hours.  Invalid input(s): FREET3  Coagulation profile Recent Labs  Lab 06/20/20 1800  INR 1.4*    ------------------------------------------------------------------------------------------------------------------- No results for input(s): DDIMER in the last 72 hours. -------------------------------------------------------------------------------------------------------------------  Cardiac Enzymes No results for input(s): CKMB, TROPONINI, MYOGLOBIN in the last 168 hours.  Invalid input(s): CK ------------------------------------------------------------------------------------------------------------------    Component Value Date/Time   BNP 601.0 (H) 06/20/2020 1800     ---------------------------------------------------------------------------------------------------------------  Urinalysis    Component Value Date/Time   COLORURINE YELLOW 05/16/2016 0035   APPEARANCEUR CLEAR 05/16/2016 0035   LABSPEC 1.025 05/16/2016 0035   PHURINE 5.5 05/16/2016 0035   GLUCOSEU 250 (A) 05/16/2016 0035   HGBUR NEGATIVE 05/16/2016 0035   BILIRUBINUR NEGATIVE 05/16/2016 0035   KETONESUR NEGATIVE 05/16/2016 0035   PROTEINUR NEGATIVE 05/16/2016 0035   UROBILINOGEN 1.0 08/28/2015 1634   NITRITE NEGATIVE 05/16/2016 0035   LEUKOCYTESUR NEGATIVE 05/16/2016 0035    ----------------------------------------------------------------------------------------------------------------   Imaging Results:    No results found.  EKG is pending.   Assessment & Plan:    Active Problems:   Diabetes mellitus type 2, uncontrolled (HCC)   Hypertension   CAD (coronary artery disease)   OSA on CPAP   Anemia   Liver failure (HCC)  Volume overload -Significant volume overload, 60 pounds weight gain over last 58-month, +3 edema, scrotal edema and increased abdominal girth, this is most likely in the setting of liver failure, and to a much lesser degree due to chronic diastolic CHF.  Liver cirrhosis -Patient with evidence of liver cirrhosis on imaging and/01/2020, showing nodular contour of the liver  consistent with cirrhosis,  as well with splenomegaly. -We will consult GI for further recommendation. -Per oncology documentation patient has been [facility] to [facility] for liver [facility]. -We will continue with IV diuresis, will start on Lasix 40 mg IV twice daily, will continue with Aldactone 50 mg oral daily, will go up slowly and Aldactone as long potassium and creatinine is tolerating. -Will check acute hepatitis panel, denies any history of alcohol abuse, likely because of his liver cirrhosis is [name]. -He is already on nadolol for hypertension. -He has some coagulopathy with INR of 1.4. -Already has some splenomegaly and thrombocytopenia.  AKI on CKD stage III -We will hold telmisartan, avoid nephrotoxic medication, will monitor closely as on diuresis.  Acute on chronic diastolic CHF -Is to be mildly contributing to his volume overload, will monitor closely on IV diuresis. -Will check BNP  Hypertension -Continue only with nadolol, will hold hydrochlorothiazide and olmesartan, in the setting of worsening renal function, and to allow more room for diuresis.   Hx of CAD -Aspirin and statin -Denies any chest pain, will check EKG on admission  OSA -Continue with CPAP  Thrombocytopenia -Due to splenomegaly, SCD for DVT prophylaxis.  Coagulopathy -INR of 1.4, due to liver disease  Diabetes mellitus -We will resume his Tresiba at a lower dose, will continue with Levemir at a lower dose, will lower from 70-40 daytime, and from 40-20 nightly, will add insulin sliding scale .  Patient was supposed to be started on p.o. antibiotic for  congestion by PCP, I will check chest x-ray, if there is any evidence of pneumonia I will start on antibiotics, otherwise no indication.  DVT Prophylaxis SCD  AM Labs Ordered, also please review Full Orders  Family Communication: Admission, patients condition and plan of care including tests being ordered have been discussed with the patient and wife at  bedside who indicate understanding and agree with the plan and Code Status.  Code Status Full  Likely DC to Home  Condition GUARDED    Consults called:  GI requested in EPIC  Admission status:  inpatient  Time spent in minutes : 60 minutes   Phillips Climes M.D on 06/20/2020 at 7:08 PM   [facility] - Office  [phone]

## 2020-06-20 NOTE — Progress Notes (Signed)
Cardiology Office Note  Date: 06/20/2020   ID: Elim, Economou 08-26-1961, MRN [id number]  PCP:  [name], MD  Cardiologist:  Rozann Lesches, MD Electrophysiologist:  None   Chief Complaint  Patient presents with  . Cardiac follow-up    History of Present Illness: [name] is a 59 y.o. male last seen by Ms. Barrett PA-C in June.  He presents for a follow-up visit.  Lasix was increased to 80 mg daily at the last visit, he has had continued weight gain since that time.  He reports leg swelling bilaterally up to the thighs, scrotal edema, increasing abdominal girth.  Feels very uncomfortable.  He does not report any palpitations or chest pain.  Follow-up echocardiogram in June revealed LVEF 60 to 65% with indeterminate diastolic parameters, normal RV contraction, mild mitral regurgitation, mild aortic stenosis.  Recent lab work shows BUN and creatinine up to 61 and 2.08 respectively from June 8.  In going back through the chart, since April of this year he has gained nearly 60 pounds.  He had an abdominal ultrasound back in February of this year suggesting cirrhosis with heterogeneous hepatic echotexture and slightly nodular contour.  He also had splenomegaly at that time portal vein.  Incidentally noted 3.9 cm AAA.  He is also following with Dr. Delton Coombes, history of thrombocytopenia.  Past Medical History:  Diagnosis Date  . Anemia   . Arthritis   . CAD (coronary artery disease)    Multivessel disease status post CABG 08/2015  . CKD (chronic kidney disease) stage 3, GFR 30-59 ml/min   . Essential hypertension   . Hyperlipidemia   . OSA on CPAP   . Polyclonal gammopathy   . Thrombocytopenia ([place]) 2016  . Type 2 diabetes mellitus ([place])     Past Surgical History:  Procedure Laterality Date  . APPENDECTOMY    . Biceps tendon surgery Right   . BIOPSY  11/22/2019   Procedure: BIOPSY;  Surgeon: Daneil Dolin, MD;  [facility];   Service: Endoscopy;;  . CARDIAC CATHETERIZATION N/A 08/25/2015   Procedure: Left Heart Cath and Coronary Angiography;  Surgeon: Belva Crome, MD;  Location: [facility];  Service: Cardiovascular;  Laterality: N/A;  . COLONOSCOPY WITH PROPOFOL N/A 11/22/2019   Procedure: COLONOSCOPY WITH PROPOFOL;  Surgeon: Daneil Dolin, MD; Four 4-5 mm polyps, findings suggestive of portal colopathy, congested appearing colonic mucosa diffusely, rectal varices, and adequate right colon prep.  Pathology with tubular adenomas and hyperplastic polyp.  Right colon biopsy with focal active colitis.  Recommendations to repeat colonoscopy in 3 months due to poor prep.  . COLONOSCOPY WITH PROPOFOL N/A 03/17/2020   Procedure: COLONOSCOPY WITH PROPOFOL;  Surgeon: Daneil Dolin, MD;  [facility];  Service: Endoscopy;  Laterality: N/A;  8:45am - pt does not need covid test, was + 2/4 <90 days  . CORONARY ARTERY BYPASS GRAFT N/A 08/29/2015   Procedure: CORONARY ARTERY BYPASS GRAFTING (CABG);  Surgeon: [name], MD;  Location: [facility];  Service: Open Heart Surgery;  Laterality: N/A;  . ESOPHAGOGASTRODUODENOSCOPY (EGD) WITH PROPOFOL N/A 11/22/2019   Procedure: ESOPHAGOGASTRODUODENOSCOPY (EGD) WITH PROPOFOL;  Surgeon: Daneil Dolin, MD; 4 columns of grade 2-3 esophageal varices, portal gastropathy, gastric polyp/abnormal gastric mucosa s/p biopsy.  Pathology with hyperplastic polyp, mild chronic gastritis, negative H. pylori.  [name] KNEE ARTHROSCOPY Left   . TEE WITHOUT CARDIOVERSION N/A 08/29/2015   Procedure: TRANSESOPHAGEAL ECHOCARDIOGRAM (TEE);  Surgeon:  [name], MD;  Location: [facility];  Service: Open Heart Surgery;  Laterality: N/A;  . TOTAL KNEE ARTHROPLASTY Left 03/09/2017   Procedure: TOTAL KNEE ARTHROPLASTY;  Surgeon: Carole Civil, MD;  Location: [facility];  Service: Orthopedics;  Laterality: Left;    Current Outpatient Medications  Medication Sig Dispense Refill  . albuterol  (VENTOLIN HFA) 108 (90 Base) MCG/ACT inhaler Inhale 2 puffs into the lungs every 6 (six) hours as needed for wheezing or shortness of breath. 18 g 0  . Armodafinil 200 MG TABS Use as needed for sleepiness, by mouth. 1 tab  before 12 noon. 30 tablet 5  . aspirin EC 81 MG tablet Take 81 mg by mouth at bedtime.     [name] b complex vitamins tablet Take 1 tablet by mouth daily.    [name] Bioflavonoid Products (ESTER C PO) Take 1 capsule by mouth daily.    [name] bismuth subsalicylate (PEPTO BISMOL) 262 MG/15ML suspension Take 30 mLs by mouth every 6 (six) hours as needed.    . Cholecalciferol (DIALYVITE VITAMIN D 5000) 125 MCG (5000 UT) capsule Take 10,000 Units by mouth daily.    [name] (EUCRISA) 2 % OINT Apply 1 application topically 2 (two) times daily as needed. 60 g 5  . diphenhydrAMINE (BENADRYL) 2 % cream Apply 1 application topically 3 (three) times daily as needed for itching.    [name] Bandages & Supports (MEDICAL COMPRESSION STOCKINGS) MISC 1 each by Does not apply route as directed. Low pressure knee high compression stockings Dx: leg swelling 1 each 0  . ELDERBERRY PO Take 1 capsule by mouth daily. With  Vitamin c and zinc    . ferrous sulfate 325 (65 FE) MG tablet Take 325 mg by mouth daily.    . Flaxseed, Linseed, (FLAXSEED OIL PO) Take 1,400 mg by mouth daily.     . furosemide (LASIX) 20 MG tablet Take 4 tablets (80 mg total) by mouth daily. 90 tablet 2  . hydrochlorothiazide (HYDRODIURIL) 25 MG tablet Take 12.5 mg by mouth daily.   3  . insulin aspart (NOVOLOG) 100 UNIT/ML injection Inject 5-30 Units into the skin 3 (three) times daily before meals.     [name] loratadine (CLARITIN) 10 MG tablet Take 10 mg by mouth daily.    . Magnesium 250 MG TABS Take 250 mg by mouth daily.    . Multiple Vitamins-Minerals (MULTIVITAMIN ADULT PO) Take 1 tablet by mouth daily.    . nadolol (CORGARD) 20 MG tablet TAKE 1 TABLET(20 MG) BY MOUTH DAILY 30 tablet 3  . olmesartan (BENICAR) 40 MG tablet Take 40 mg by  mouth daily.     [name] omeprazole (PRILOSEC) 40 MG capsule Take 40 mg by mouth at bedtime.  2  . pravastatin (PRAVACHOL) 20 MG tablet TAKE 1 TABLET(20 MG) BY MOUTH AT BEDTIME 90 tablet 3  . traMADol-acetaminophen (ULTRACET) 37.5-325 MG tablet TAKE 1 TABLET BY MOUTH EVERY 4 HOURS AS NEEDED 60 tablet 0  . TRESIBA FLEXTOUCH 200 UNIT/ML SOPN Inject 40-70 Units into the skin See admin instructions. Inject 70 units into the skin in the morning and 40 units at bedtime    . TURMERIC CURCUMIN PO Take 2,000 mg by mouth daily.      No current facility-administered medications for this visit.   Allergies:  Patient has no known allergies.   Social History: The patient  reports that he has never smoked. He has never used smokeless tobacco. He reports that he does not  drink alcohol and does not use drugs.   Family History: The patient's family history includes ALS in his mother; Aneurysm in his maternal grandmother; Arthritis in an other family member; CAD in his brother and father; Cancer in his maternal grandfather and another family member; Diabetes in an other family member; Diabetes Mellitus II in his brother, brother, father, maternal grandmother, and sister; Hodgkin's lymphoma in his father; Liver cancer in his father.   ROS:  No syncope.  No fevers or chills.  Physical Exam: VS:  BP 130/60   Pulse 76   Ht 5' 10.5" (1.791 m)   Wt (!) 319 lb 6.4 oz (144.9 kg)   SpO2 93%   BMI 45.18 kg/m , BMI Body mass index is 45.18 kg/m.  Wt Readings from Last 3 Encounters:  06/20/20 (!) 319 lb 6.4 oz (144.9 kg)  05/30/20 300 lb (136.1 kg)  05/13/20 296 lb (134.3 kg)    General: Patient with obvious anasarca. HEENT: Conjunctiva and lids normal, wearing a mask. Neck: Supple, increased JVP, no carotid bruits. Lungs: Decreased breath sounds at the bases, nonlabored breathing at rest. Cardiac: Regular rate and rhythm, no S3, 2/6 systolic murmur, no pericardial rub. Abdomen: Protuberant and tight with anasarca.   Bowel sounds present Extremities: Significant bilateral edema to the thighs with scrotal edema. Skin: Warm and dry. Musculoskeletal: No kyphosis. Neuropsychiatric: Alert and oriented x3, affect grossly appropriate.  ECG:  An ECG dated 11/26/2019 was personally reviewed today and demonstrated:  Sinus rhythm with poor R wave progression and nonspecific T wave changes.  Recent Labwork: 08/31/2019: TSH 2.400 06/19/2020: ALT 11; AST 28; BUN 61; Creatinine, Ser 2.08; Hemoglobin 10.5; Platelets 80; Potassium 4.7; Sodium 142     Component Value Date/Time   CHOL 120 05/25/2019 1001   TRIG 136 05/25/2019 1001   HDL 38 (L) 05/25/2019 1001   CHOLHDL 3.2 05/25/2019 1001   VLDL 27 05/25/2019 1001   LDLCALC 55 05/25/2019 1001    Other Studies Reviewed Today:  Echocardiogram 05/26/2020: 1. Left ventricular ejection fraction, by estimation, is 60 to 65%. The  left ventricle has normal function. The left ventricle has no regional  wall motion abnormalities. The left ventricular internal cavity size was  mildly dilated. There is mild left  ventricular hypertrophy. Left ventricular diastolic parameters are  indeterminate.  2. Right ventricular systolic function is normal. The right ventricular  size is normal. There is normal pulmonary artery systolic pressure.  3. The mitral valve is abnormal. Mild mitral valve regurgitation.  4. AV is thickened, calcifieid Difficult to see well. Peak and mean  gradients through the valve are 31 and 18 mm Hg respectively consistent  with mild AS. Compared to echo report from 2019, gradients are increased.  . The aortic valve is abnormal. Aortic  valve regurgitation is not visualized. Mild aortic valve stenosis.  5. The inferior vena cava is dilated in size with <50% respiratory  variability, suggesting right atrial pressure of 15 mmHg.   Carotid Dopplers 05/28/2019: IMPRESSION: Minimal amount of atherosclerotic plaque results in elevated peak systolic  velocities within the bilateral internal carotid arteries compatible with the 50-69% luminal narrowing range bilaterally. Further evaluation with CTA could performed as clinically indicated.  Assessment and Plan:  1.  Progressive anasarca with substantial weight gain, approximately 60 pounds since April based on chart review.  He is not responding to Lasix 80 mg daily at home.  Acute on chronic diastolic heart failure was considered when seen earlier in June (also history  of lymphedema), although follow-up echocardiogram shows LVEF 60 to 65% with indeterminate diastolic function, normal RV contraction and estimated PA pressure.  He has mild aortic stenosis, this should not be contributing.  He did have an abdominal ultrasound back in February which raised the possibility of cirrhosis and this may be more of the problem at this point.  Recent LFTs normal as were total protein and albumin.  Portal vein described as patent by the previous abdominal ultrasound.  I have recommended hospitalization for IV diuresis and further work-up.  I spoke with Dr. Denton Brick on the hospitalist team and patient will be directly admitted.  Would recommend starting a Lasix drip, could initiate at 5 mg/h and follow urine output as well as renal function.  Would reimage lungs and abdomen, he is currently not hypoxic and hemodynamically stable.  Would get input from gastroenterology.  2.  Acute on chronic renal insufficiency with CKD stage IIIb at baseline.  3.  CAD status post CABG in 2016.  No obvious angina reported.  He is on aspirin, Benicar, nadolol, and Pravachol.  4.  Mild aortic stenosis, asymptomatic.  Recent mean gradient 18 mmHg.  Medication Adjustments/Labs and Tests Ordered: Current medicines are reviewed at length with the patient today.  Concerns regarding medicines are outlined above.   Tests Ordered: No orders of the defined types were placed in this encounter.   Medication Changes: No orders of the  defined types were placed in this encounter.   Disposition: Direct admission to the hospitalist service at [place], Satira Sark, MD, [facility] 06/20/2020 3:10 PM    [facility] [address] [place], [place] [address] Phone: [phone]; Fax: [phone]

## 2020-06-21 ENCOUNTER — Inpatient Hospital Stay (HOSPITAL_COMMUNITY): Payer: Federal, State, Local not specified - PPO

## 2020-06-21 DIAGNOSIS — I10 Essential (primary) hypertension: Secondary | ICD-10-CM | POA: Diagnosis not present

## 2020-06-21 DIAGNOSIS — D61818 Other pancytopenia: Secondary | ICD-10-CM

## 2020-06-21 DIAGNOSIS — N179 Acute kidney failure, unspecified: Secondary | ICD-10-CM

## 2020-06-21 DIAGNOSIS — I5033 Acute on chronic diastolic (congestive) heart failure: Secondary | ICD-10-CM | POA: Diagnosis not present

## 2020-06-21 DIAGNOSIS — N1831 Chronic kidney disease, stage 3a: Secondary | ICD-10-CM | POA: Diagnosis not present

## 2020-06-21 DIAGNOSIS — E1165 Type 2 diabetes mellitus with hyperglycemia: Secondary | ICD-10-CM

## 2020-06-21 DIAGNOSIS — G4733 Obstructive sleep apnea (adult) (pediatric): Secondary | ICD-10-CM | POA: Diagnosis not present

## 2020-06-21 DIAGNOSIS — K7581 Nonalcoholic steatohepatitis (NASH): Secondary | ICD-10-CM

## 2020-06-21 DIAGNOSIS — K7689 Other specified diseases of liver: Secondary | ICD-10-CM | POA: Diagnosis not present

## 2020-06-21 DIAGNOSIS — K828 Other specified diseases of gallbladder: Secondary | ICD-10-CM | POA: Diagnosis not present

## 2020-06-21 DIAGNOSIS — K7469 Other cirrhosis of liver: Secondary | ICD-10-CM | POA: Diagnosis not present

## 2020-06-21 DIAGNOSIS — Z9989 Dependence on other enabling machines and devices: Secondary | ICD-10-CM | POA: Diagnosis not present

## 2020-06-21 DIAGNOSIS — K766 Portal hypertension: Secondary | ICD-10-CM | POA: Diagnosis not present

## 2020-06-21 DIAGNOSIS — D631 Anemia in chronic kidney disease: Secondary | ICD-10-CM

## 2020-06-21 LAB — COMPREHENSIVE METABOLIC PANEL
ALT: 10 U/L (ref 0–44)
AST: 26 U/L (ref 15–41)
Albumin: 3.3 g/dL — ABNORMAL LOW (ref 3.5–5.0)
Alkaline Phosphatase: 42 U/L (ref 38–126)
Anion gap: 9 (ref 5–15)
BUN: 59 mg/dL — ABNORMAL HIGH (ref 6–20)
CO2: 23 mmol/L (ref 22–32)
Calcium: 8.9 mg/dL (ref 8.9–10.3)
Chloride: 112 mmol/L — ABNORMAL HIGH (ref 98–111)
Creatinine, Ser: 2.08 mg/dL — ABNORMAL HIGH (ref 0.61–1.24)
GFR calc Af Amer: 39 mL/min — ABNORMAL LOW (ref >60)
GFR calc non Af Amer: 34 mL/min — ABNORMAL LOW (ref >60)
Glucose, Bld: 86 mg/dL (ref 70–99)
Potassium: 4.4 mmol/L (ref 3.5–5.1)
Sodium: 144 mmol/L (ref 135–145)
Total Bilirubin: 1.1 mg/dL (ref 0.3–1.2)
Total Protein: 6.7 g/dL (ref 6.5–8.1)

## 2020-06-21 LAB — HEPATITIS PANEL, ACUTE
HCV Ab: NONREACTIVE
Hep A IgM: NONREACTIVE
Hep B C IgM: NONREACTIVE
Hepatitis B Surface Ag: NONREACTIVE

## 2020-06-21 LAB — CBC
HCT: 30.2 % — ABNORMAL LOW (ref 39.0–52.0)
Hemoglobin: 9.4 g/dL — ABNORMAL LOW (ref 13.0–17.0)
MCH: 34.3 pg — ABNORMAL HIGH (ref 26.0–34.0)
MCHC: 31.1 g/dL (ref 30.0–36.0)
MCV: 110.2 fL — ABNORMAL HIGH (ref 80.0–100.0)
Platelets: 58 10*3/uL — ABNORMAL LOW (ref 150–400)
RBC: 2.74 MIL/uL — ABNORMAL LOW (ref 4.22–5.81)
RDW: 14.7 % (ref 11.5–15.5)
WBC: 2.9 10*3/uL — ABNORMAL LOW (ref 4.0–10.5)
nRBC: 0 % (ref 0.0–0.2)

## 2020-06-21 LAB — GLUCOSE, CAPILLARY
Glucose-Capillary: 76 mg/dL (ref 70–99)
Glucose-Capillary: 67 mg/dL — ABNORMAL LOW (ref 70–99)
Glucose-Capillary: 183 mg/dL — ABNORMAL HIGH (ref 70–99)
Glucose-Capillary: 246 mg/dL — ABNORMAL HIGH (ref 70–99)

## 2020-06-21 LAB — HEMOGLOBIN A1C
Hgb A1c MFr Bld: 6.8 % — ABNORMAL HIGH (ref 4.8–5.6)
Mean Plasma Glucose: 148.46 mg/dL

## 2020-06-21 LAB — HIV ANTIBODY (ROUTINE TESTING W REFLEX): HIV Screen 4th Generation wRfx: NONREACTIVE

## 2020-06-21 MED ORDER — ALBUMIN HUMAN 25 % IV SOLN
50.0000 g | Freq: Four times a day (QID) | INTRAVENOUS | Status: AC
  Administered 2020-06-21 – 2020-06-22 (×4): 50 g via INTRAVENOUS
  Filled 2020-06-21 (×4): qty 200

## 2020-06-21 MED ORDER — SPIRONOLACTONE 25 MG PO TABS
25.0000 mg | ORAL_TABLET | Freq: Two times a day (BID) | ORAL | Status: DC
  Administered 2020-06-21 – 2020-06-26 (×10): 25 mg via ORAL
  Filled 2020-06-21 (×10): qty 1

## 2020-06-21 MED ORDER — LACTULOSE 10 GM/15ML PO SOLN
5.0000 g | Freq: Every morning | ORAL | Status: DC
  Administered 2020-06-21 – 2020-06-25 (×5): 5 g via ORAL
  Filled 2020-06-21 (×5): qty 30

## 2020-06-21 MED ORDER — TRAMADOL HCL 50 MG PO TABS
50.0000 mg | ORAL_TABLET | ORAL | Status: DC | PRN
  Administered 2020-06-22 – 2020-06-30 (×16): 50 mg via ORAL
  Filled 2020-06-21 (×17): qty 1

## 2020-06-21 NOTE — Progress Notes (Addendum)
PROGRESS NOTE    [name]  ERX:[id number] DOB: [birth year] DOA: 06/20/2020 PCP: [name], MD   Chief complaint: Fluid overload/anasarca, shortness of breath or orthopnea.  Brief Narrative:  As per H&P written by Dr. Waldron Labs on 06/20/2020  59 y.o. male, past medical history of chronic diastolic CHF, who does report worsening overload status, was recently seen by cardiology in June, where they have increased his Lasix to 80 mg oral daily, despite that he continues to have worsening volume overload, and increased weight gain, worsening lower extremity edema up to the thigh, scrotal edema, increased abdominal girth as well, does report some exertional dyspnea as well, he does report some cough and congestion recently started on p.o. antibiotics by PCP which he did not start taking it, he denies any chest pain, but he does report dyspnea, mainly exertional, he was seen today by cardiology for a follow-up, where his creatinine was noted to have increased to 2.08, he was significantly volume overloaded, and referred to the hospital for admission and IV diuresis.   Assessment & Plan: 1-fluid overload/anasarca: Multifactorial in the setting of cirrhosis and acute on chronic diastolic heart failure -Patient encouraged to follow low-sodium diet -Continue IV diuresis with Lasix and also the use of Aldactone (last 1 increase to 25 mg twice a day). -Continue to follow daily weight and history does not note -IV albumin will be also started. -Cardiology service and gastroenterology service.  2-Diabetes mellitus type 2, uncontrolled ([place]) with nephropathy -Continue long-acting insulin and sliding scale insulin -Follow CBGs and adjust hypoglycemic regimen as required.  3-acute on chronic stage IIIa chronic kidney disease -In the setting of heart failure exacerbation and concern for hepatorenal syndrome -Continue diuresis and the use of albumin -Closely for electrolytes and renal  function. -Minimize the use of nephrotoxic agents.  4-CAD (coronary artery disease) -No chest pain currently. -Continue the use of beta-blocker, aspirin and statins. -Telemetry monitoring in place.  5-OSA on CPAP -Continue CPAP nightly.  6-pancytopenia -In the setting of cirrhosis and anemia of chronic kidney disease -Continue patient follow-up with Dr. Delton Coombes for pancytopenia purposes and further work-up -Continue GI follow-up and referral to hepatology clinic in [place]. -No signs of overt bleeding -Continue the use of nadolol and PPI.   7-NASH cirrhosis -Guarded prognosis as per GI evaluation -Plan is to pursued outpatient follow-up with [facility] hepatology clinic -Because of his cirrhosis is NASH. -Ammonia has been added -Continue Lasix, Aldactone, nadolol and PPI  8-morbid obesity -Body mass index is 45.28 kg/m. -low calorie diet and portion control discussed with patient   DVT prophylaxis: SCDs Code Status: Full code Family Communication: No family member at bedside. Disposition:   Status is: Inpatient  Dispo: The patient is from: Home              Anticipated d/c is to: To be determined; hopefully home.              Anticipated d/c date is: To be determined              Patient currently no medically stable for discharge; still with significant fluid overload findings, complaining of shortness of breath with exertion and having orthopnea.  Continue IV diuresis.     Consultants:   Cardiology service  GI service   Procedures:  See below for x-ray reports  Antimicrobials:  None  Subjective: Still with significant fluid overload findings on examination; complaining of shortness of breath with minimal exertion and positive orthopnea.  No chest  pain, no nausea, no vomiting.  Objective: Vitals:   06/21/20 0300 06/21/20 1004 06/21/20 1414 06/21/20 1616  BP:  120/68 127/67 119/62  Pulse:  66 64 65  Resp:  16 18 15   Temp:  97.7 F (36.5 C) 98.1 F (36.7  C) 97.9 F (36.6 C)  TempSrc:  Oral Oral Oral  SpO2:  98% 100% 100%  Weight: (!) 145.2 kg     Height:        Intake/Output Summary (Last 24 hours) at 06/21/2020 1751 Last data filed at 06/21/2020 1124 Gross per 24 hour  Intake --  Output 900 ml  Net -900 ml   Filed Weights   06/20/20 1834 06/21/20 0300  Weight: (!) 144.9 kg (!) 145.2 kg    Examination: General exam: Appears calm and comfortable, still with significant fluid overload findings complaining of orthopnea and shortness of breath with minimal exertion.  No requiring oxygen supplementation.  No chest pain, no nausea, no vomiting. Respiratory system: Decreased breath sounds at the bases, no wheezing, no using accessory muscles.   Cardiovascular system: S1 & S2 heard, no rubs, no gallops, unable to properly assess JVD with body habitus. Gastrointestinal system: Abdomen is obese, tense with increased abdominal girth and positive bowel sounds.  Reports no pain on palpation.   Central nervous system: Alert and oriented. No focal neurological deficits. Extremities: Symmetric 4/5 muscle strength due to increased swelling and difficulty bending his legs.  3+ edema bilaterally extending all the way to his thighs. Skin: No open wounds, no petechiae; scrotum swelling appreciated. Psychiatry: Judgement and insight appear normal. Mood & affect appropriate.     Data Reviewed: I have personally reviewed following labs and imaging studies  CBC: Recent Labs  Lab 06/19/20 1325 06/20/20 1800 06/21/20 0608  WBC 5.4 3.6* 2.9*  NEUTROABS 3.7  --   --   HGB 10.5* 9.8* 9.4*  HCT 32.3* 31.0* 30.2*  MCV 107.3* 109.5* 110.2*  PLT 80* 63* 58*    Basic Metabolic Panel: Recent Labs  Lab 06/19/20 1325 06/20/20 1800 06/21/20 0608  NA 142 142 144  K 4.7 4.8 4.4  CL 109 112* 112*  CO2 21* 24 23  GLUCOSE 64* 120* 86  BUN 61* 59* 59*  CREATININE 2.08* 2.11* 2.08*  CALCIUM 9.0 8.8* 8.9    GFR: Estimated Creatinine Clearance: 54.8  mL/min (A) (by C-G formula based on SCr of 2.08 mg/dL (H)).  Liver Function Tests: Recent Labs  Lab 06/19/20 1325 06/20/20 1800 06/21/20 0608  AST 28 27 26   ALT 11 11 10   ALKPHOS 47 45 42  BILITOT 1.1 1.0 1.1  PROT 7.4 7.1 6.7  ALBUMIN 3.9 3.6 3.3*    CBG: Recent Labs  Lab 06/20/20 1656 06/20/20 2145 06/21/20 0742 06/21/20 1121 06/21/20 1624  GLUCAP 132* 111* 76 67* 183*     Recent Results (from the past 240 hour(s))  SARS Coronavirus 2 by RT PCR (hospital order, performed in [place] hospital lab) Nasopharyngeal Nasopharyngeal Swab     Status: None   Collection Time: 06/20/20  5:30 PM   Specimen: Nasopharyngeal Swab  Result Value Ref Range Status   SARS Coronavirus 2 NEGATIVE NEGATIVE Final    Comment: (NOTE) SARS-CoV-2 target nucleic acids are NOT DETECTED.  The SARS-CoV-2 RNA is generally detectable in upper and lower respiratory specimens during the acute phase of infection. The lowest concentration of SARS-CoV-2 viral copies this assay can detect is 250 copies / mL. A negative result does not preclude SARS-CoV-2  infection and should not be used as the sole basis for treatment or other patient management decisions.  A negative result may occur with improper specimen collection / handling, submission of specimen other than nasopharyngeal swab, presence of viral mutation(s) within the areas targeted by this assay, and inadequate number of viral copies (<250 copies / mL). A negative result must be combined with clinical observations, patient history, and epidemiological information.  Fact Sheet for Patients:   StrictlyIdeas.no  Fact Sheet for Healthcare Providers: BankingDealers.co.za  This test is not yet approved or  cleared by the [place] FDA and has been authorized for detection and/or diagnosis of SARS-CoV-2 by FDA under an Emergency Use Authorization (EUA).  This EUA will remain in effect (meaning  this test can be used) for the duration of the COVID-19 declaration under Section 564(b)(1) of the Act, 21 U.S.C. section 360bbb-3(b)(1), unless the authorization is terminated or revoked sooner.  Performed at [facility], [address], [place], [place] [address]      Radiology Studies: US Abdomen Complete  Result Date: 06/21/2020 CLINICAL DATA:  Morbid obesity, hepatic cirrhosis EXAM: ABDOMEN ULTRASOUND COMPLETE COMPARISON:  Prior abdominal ultrasound 01/15/2020 FINDINGS: Gallbladder: Multiple mobile echogenic foci within the gallbladder lumen consistent with cholelithiasis. The gallbladder wall is thickened at 4.6 mm. Per the sonographer, the sonographic Percell Miller sign was negative. Common bile duct: Diameter: Normal at 4 mm. Liver: Nodular hepatic contour. The hepatic parenchyma is diffusely heterogeneous with coarsening of the echotexture. No discrete hepatic lesion is identified. Small volume perihepatic ascites. Portal vein is patent on color Doppler imaging with normal direction of blood flow towards the liver. IVC: No abnormality visualized. Pancreas: Visualized portion unremarkable. Spleen: Splenomegaly. The spleen measures approximately 768 cubic cm in volume. There is small volume perisplenic ascites as well. Right Kidney: Length: 10.5 cm. Echogenicity within normal limits. No mass or hydronephrosis visualized. Left Kidney: Length: 11.7 cm. Echogenicity within normal limits. No mass or hydronephrosis visualized. Abdominal aorta: No evidence of aneurysm. Other findings: None. IMPRESSION: 1. Hepatic cirrhosis with evidence of portal hypertension (splenomegaly and mild ascites). 2. No discrete hepatic lesion identified. 3. Cholelithiasis. 4. Gallbladder wall thickening is nonspecific in the setting of cirrhosis and can be related to the underlying liver pathology and/or hypoalbuminemia. Acute cholecystitis is not suspected. Electronically Signed   By: Jacqulynn Cadet M.D.   On: 06/21/2020 13:43    DG Chest Port 1 View  Result Date: 06/20/2020 CLINICAL DATA:  History of CHF with reported fluid overload EXAM: PORTABLE CHEST 1 VIEW COMPARISON:  Radiograph 02/14/2020, 02/01/2020 FINDINGS: Patient rotated in a left anterior obliquity. Postsurgical changes from prior sternotomy and CABG. Slight prominence of the cardiomediastinal silhouette compared to prior is likely related to projectional differences and portable technique. Calcified aorta. No focal consolidative opacity. Central vascular congestion but without convincing features of frank edema. No pneumothorax or visible effusion. No acute osseous or soft tissue abnormality. Degenerative changes are present in the imaged spine and shoulders. IMPRESSION: Central vascular congestion without frank edema or effusion at this time. Electronically Signed   By: Lovena Le M.D.   On: 06/20/2020 20:04    Scheduled Meds: . aspirin EC  81 mg Oral QHS  . cholecalciferol  5,000 Units Oral q AM  . ferrous sulfate  325 mg Oral q AM  . furosemide  40 mg Intravenous BID  . insulin aspart  0-15 Units Subcutaneous TID WC  . insulin aspart  0-5 Units Subcutaneous QHS  . insulin detemir  20 Units Subcutaneous QHS  . insulin detemir  40 Units Subcutaneous Daily  . lactulose  5 g Oral q morning - 10a  . loratadine  10 mg Oral q AM  . nadolol  20 mg Oral q AM  . pantoprazole  40 mg Oral Daily  . pravastatin  20 mg Oral QHS  . spironolactone  25 mg Oral BID   Continuous Infusions: . albumin human       LOS: 1 day    Time spent: 35 minutes   Barton Dubois, MD [facility]   To contact the attending provider between 7A-7P or the covering provider during after hours 7P-7A, please log into the web site [email or web address] and access using universal [place] password for that web site. If you do not have the password, please call the hospital operator.  06/21/2020, 5:51 PM

## 2020-06-21 NOTE — Consult Note (Signed)
Referring Provider: No ref. provider found Primary Care Physician:  [name], MD Primary Gastroenterologist:  Dr.  Luiz Iron for Consultation: Anemia  HPI: Pleasant 59 year old morbidly obese gentleman with cirrhosis secondary to ASH/NASH, coronary artery disease, aortic stenosis, chronic kidney disease (stage III), type 2 diabetes mellitus, polyclonal gammopathy of uncertain significance admitted with increasing edema.  Seen by Dr. Domenic Polite yesterday and noted to have progressing anasarca.  In fact, 60 pound weight gain going back to April.  Poor response to oral diuretics.  If hospital admission recommended. Admitting laboratory data revealed a white count 9.8 yesterday; 9.4 today white count 3.6 yesterday; 2.9 today; platelet count 58,000. BNP elevated at 601; ammonia also elevated at 68 LFTs were normal including a albumin of 3.6. Patient states his stools are intermittently dark however he takes iron daily.  On top of iron, he takes Pepto-Bismol regularly on a as needed basis.  He denies hematemesis, odynophagia, dysphagia or early satiety.  Denies hematochezia.  History of iron deficiency anemia with normal iron parameters on supplementation.  History of colonic adenomas;.  Attempted colonoscopy last year and this year thwarted because of poor preparation. Takes omeprazole 40 mg daily with good control of GERD.  Patient has a history of gallbladder polyps on imaging for which he was sent to Dr. Greer Pickerel for potential cholecystectomy but he referred patient down to [facility] for further evaluation.  That referral is pending.  He has known grade 3 esophageal varices.  He is never bled.  He is currently on primary prophylaxis taking nadolol daily.     Past Medical History:  Diagnosis Date  . Anemia   . Arthritis   . CAD (coronary artery disease)    Multivessel disease status post CABG 08/2015  . CKD (chronic kidney disease) stage 3, GFR 30-59 ml/min   . Essential hypertension     . Hyperlipidemia   . OSA on CPAP   . Polyclonal gammopathy   . Thrombocytopenia ([place]) 2016  . Type 2 diabetes mellitus ([place])     Past Surgical History:  Procedure Laterality Date  . APPENDECTOMY    . Biceps tendon surgery Right   . BIOPSY  11/22/2019   Procedure: BIOPSY;  Surgeon: Daneil Dolin, MD;  Location: [facility];  Service: Endoscopy;;  . CARDIAC CATHETERIZATION N/A 08/25/2015   Procedure: Left Heart Cath and Coronary Angiography;  Surgeon: Belva Crome, MD;  Location: [place] [facility];  Service: Cardiovascular;  Laterality: N/A;  . COLONOSCOPY WITH PROPOFOL N/A 11/22/2019   Procedure: COLONOSCOPY WITH PROPOFOL;  Surgeon: Daneil Dolin, MD; Four 4-5 mm polyps, findings suggestive of portal colopathy, congested appearing colonic mucosa diffusely, rectal varices, and adequate right colon prep.  Pathology with tubular adenomas and hyperplastic polyp.  Right colon biopsy with focal active colitis.  Recommendations to repeat colonoscopy in 3 months due to poor prep.  . COLONOSCOPY WITH PROPOFOL N/A 03/17/2020   Procedure: COLONOSCOPY WITH PROPOFOL;  Surgeon: Daneil Dolin, MD;  Location: [facility];  Service: Endoscopy;  Laterality: N/A;  8:45am - pt does not need covid test, was + 2/4 <90 days  . CORONARY ARTERY BYPASS GRAFT N/A 08/29/2015   Procedure: CORONARY ARTERY BYPASS GRAFTING (CABG);  Surgeon: [name], MD;  Location: [place];  Service: Open Heart Surgery;  Laterality: N/A;  . ESOPHAGOGASTRODUODENOSCOPY (EGD) WITH PROPOFOL N/A 11/22/2019   Procedure: ESOPHAGOGASTRODUODENOSCOPY (EGD) WITH PROPOFOL;  Surgeon: Daneil Dolin, MD; 4 columns of grade 2-3 esophageal varices,  portal gastropathy, gastric polyp/abnormal gastric mucosa s/p biopsy.  Pathology with hyperplastic polyp, mild chronic gastritis, negative H. pylori.  [name] KNEE ARTHROSCOPY Left   . TEE WITHOUT CARDIOVERSION N/A 08/29/2015   Procedure: TRANSESOPHAGEAL ECHOCARDIOGRAM (TEE);  Surgeon: [name], MD;  Location: [place];  Service: Open Heart Surgery;  Laterality: N/A;  . TOTAL KNEE ARTHROPLASTY Left 03/09/2017   Procedure: TOTAL KNEE ARTHROPLASTY;  Surgeon: Carole Civil, MD;  Location: [facility];  Service: Orthopedics;  Laterality: Left;    Prior to Admission medications   Medication Sig Start Date End Date Taking? Authorizing Provider  albuterol (VENTOLIN HFA) 108 (90 Base) MCG/ACT inhaler Inhale 2 puffs into the lungs every 6 (six) hours as needed for wheezing or shortness of breath. 06/11/19  Yes Padgett, Rae Halsted, MD  Armodafinil 200 MG TABS Use as needed for sleepiness, by mouth. 1 tab  before 12 noon. Patient taking differently: Take 200 mg by mouth daily as needed (for sleepiness).  06/19/19  Yes Dohmeier, Asencion Partridge, MD  aspirin EC 81 MG tablet Take 81 mg by mouth at bedtime.    Yes [provider]  b complex vitamins tablet Take 1 tablet by mouth in the morning.    Yes [provider]  Bioflavonoid Products (ESTER C PO) Take 1 capsule by mouth in the morning.    Yes [provider]  Cholecalciferol (DIALYVITE VITAMIN D 5000) 125 MCG (5000 UT) capsule Take 5,000 Units by mouth in the morning.    Yes [provider]  Elastic Bandages & Supports ([place]) [place] 1 each by Does not apply route as directed. Low pressure knee high compression stockings Dx: leg swelling 05/13/20  Yes Satira Sark, MD  ELDERBERRY PO Take 1 capsule by mouth at bedtime. With  Vitamin c and zinc    Yes [provider]  ferrous sulfate 325 (65 FE) MG tablet Take 325 mg by mouth in the morning.    Yes [provider]  Flaxseed, Linseed, (FLAXSEED OIL PO) Take 1,400 mg by mouth at bedtime.    Yes [provider]  furosemide (LASIX) 20 MG tablet Take 4 tablets (80 mg total) by mouth daily. Patient taking differently: Take 80 mg by mouth in the morning.  05/23/20  Yes Satira Sark, MD  hydrochlorothiazide  (HYDRODIURIL) 25 MG tablet Take 12.5 mg by mouth in the morning.  01/21/18  Yes [provider]  insulin aspart (NOVOLOG) 100 UNIT/ML injection Inject 5-30 Units into the skin 3 (three) times daily before meals.    Yes [provider]  loratadine (CLARITIN) 10 MG tablet Take 10 mg by mouth in the morning.    Yes [provider]  Magnesium 250 MG TABS Take 250 mg by mouth daily.   Yes [provider]  Multiple Vitamins-Minerals (MULTIVITAMIN ADULT PO) Take 1 tablet by mouth in the morning.    Yes [provider]  nadolol (CORGARD) 20 MG tablet TAKE 1 TABLET(20 MG) BY MOUTH DAILY Patient taking differently: Take 20 mg by mouth in the morning.  05/23/20  Yes Carlis Stable, NP  olmesartan (BENICAR) 40 MG tablet Take 40 mg by mouth in the morning.  04/14/19  Yes [provider]  omeprazole (PRILOSEC) 40 MG capsule Take 40 mg by mouth at bedtime. 09/13/18  Yes [provider]  pravastatin (PRAVACHOL) 20 MG tablet TAKE 1 TABLET(20 MG) BY MOUTH AT BEDTIME Patient taking differently: Take 20 mg by mouth at bedtime.  TAKE 1 TABLET(20 MG) BY MOUTH AT BEDTIME 01/10/20  Yes Satira Sark, MD  traMADol-acetaminophen (ULTRACET) 37.5-325 MG tablet TAKE 1 TABLET BY MOUTH EVERY 4 HOURS AS NEEDED Patient taking differently: Take 1-2 tablets by mouth every 4 (four) hours as needed for moderate pain or severe pain.  04/28/20  Yes Carole Civil, MD  TRESIBA FLEXTOUCH 200 UNIT/ML SOPN Inject 40-70 Units into the skin See admin instructions. Inject 70 units into the skin in the morning and 40 units at bedtime 01/02/18  Yes [provider]  TURMERIC CURCUMIN PO Take 2,000 mg by mouth at bedtime.    Yes [provider]    Current Facility-Administered Medications  Medication Dose Route Frequency Provider Last Rate Last Admin  . albuterol (PROVENTIL) (2.5 MG/3ML) 0.083% nebulizer solution 2.5 mg  2.5 mg Nebulization Q6H PRN Elgergawy, Silver Huguenin,  MD   2.5 mg at 06/20/20 2105  . aspirin EC tablet 81 mg  81 mg Oral QHS Elgergawy, Silver Huguenin, MD   81 mg at 06/20/20 2201  . cholecalciferol (VITAMIN D3) tablet 5,000 Units  5,000 Units Oral q AM Elgergawy, Silver Huguenin, MD      . ferrous sulfate tablet 325 mg  325 mg Oral q AM Elgergawy, Silver Huguenin, MD      . furosemide (LASIX) injection 40 mg  40 mg Intravenous BID Lovey Newcomer T, NP   40 mg at 06/20/20 2200  . insulin aspart (novoLOG) injection 0-15 Units  0-15 Units Subcutaneous TID WC Elgergawy, Emeline Gins S, MD      . insulin aspart (novoLOG) injection 0-5 Units  0-5 Units Subcutaneous QHS Elgergawy, Dawood S, MD      . insulin detemir (LEVEMIR) injection 20 Units  20 Units Subcutaneous QHS Elgergawy, Dawood S, MD      . insulin detemir (LEVEMIR) injection 40 Units  40 Units Subcutaneous Daily Elgergawy, Silver Huguenin, MD      . loratadine (CLARITIN) tablet 10 mg  10 mg Oral q AM Elgergawy, Silver Huguenin, MD      . nadolol (CORGARD) tablet 20 mg  20 mg Oral q AM Elgergawy, Silver Huguenin, MD      . pantoprazole (PROTONIX) EC tablet 40 mg  40 mg Oral Daily Elgergawy, Silver Huguenin, MD   40 mg at 06/20/20 2159  . pneumococcal 23 valent vaccine (PNEUMOVAX-23) injection 0.5 mL  0.5 mL Intramuscular Tomorrow-1000 Elgergawy, Dawood S, MD      . pravastatin (PRAVACHOL) tablet 20 mg  20 mg Oral QHS Elgergawy, Silver Huguenin, MD   20 mg at 06/20/20 2201  . spironolactone (ALDACTONE) tablet 25 mg  25 mg Oral Daily Elgergawy, Silver Huguenin, MD   25 mg at 06/20/20 2159  . traMADol (ULTRAM) tablet 50 mg  50 mg Oral Q4H PRN Barton Dubois, MD        Allergies as of 06/20/2020  . (No Known Allergies)    Family History  Problem Relation Age of Onset  . Arthritis Other   . Cancer Other   . Diabetes Other   . CAD Father   . Diabetes Mellitus II Father   . Liver cancer Father   . Hodgkin's lymphoma Father   . CAD Brother   . Diabetes Mellitus II Brother   . ALS Mother   . Diabetes Mellitus II Sister   . Diabetes Mellitus II Brother     . Diabetes Mellitus II Maternal Grandmother   . Aneurysm Maternal Grandmother   . Cancer Maternal Grandfather   .  Anesthesia problems Neg Hx   . Hypotension Neg Hx   . Malignant hyperthermia Neg Hx   . Pseudochol deficiency Neg Hx   . Colon cancer Neg Hx     Social History   Socioeconomic History  . Marital status: Married    Spouse name: Not on file  . Number of children: 0  . Years of education: college  . Highest education level: Not on file  Occupational History  . Occupation: Maintenance tech    Employer: [employer]  Tobacco Use  . Smoking status: Never Smoker  . Smokeless tobacco: Never Used  Vaping Use  . Vaping Use: Never used  Substance and Sexual Activity  . Alcohol use: No    Alcohol/week: 0.0 standard drinks  . Drug use: No  . Sexual activity: Not Currently  Other Topics Concern  . Not on file  Social History Narrative  . Not on file   Social Determinants of Health   Financial Resource Strain:   . Difficulty of Paying Living Expenses:   Food Insecurity:   . Worried About Charity fundraiser in the Last Year:   . Arboriculturist in the Last Year:   Transportation Needs:   . Film/video editor (Medical):   [name] Lack of Transportation (Non-Medical):   Physical Activity:   . Days of Exercise per Week:   . Minutes of Exercise per Session:   Stress:   . Feeling of Stress :   Social Connections:   . Frequency of Communication with Friends and Family:   . Frequency of Social Gatherings with Friends and Family:   . Attends Religious Services:   . Active Member of Clubs or Organizations:   . Attends Archivist Meetings:   [name] Marital Status:   Intimate Partner Violence:   . Fear of Current or Ex-Partner:   . Emotionally Abused:   [name] Physically Abused:   . Sexually Abused:     Review of Systems: As in history of present illness.   Physical Exam: Vital signs in last 24 hours: Temp:  [97.9 F (36.6 C)-98.3 F (36.8 C)] 98.3 F  (36.8 C) (07/10 0513) Pulse Rate:  [66-76] 66 (07/10 0513) Resp:  [16-20] 16 (07/10 0513) BP: (96-130)/(55-71) 100/55 (07/10 0513) SpO2:  [93 %-100 %] 97 % (07/10 0513) Weight:  [144.9 kg-145.2 kg] 145.2 kg (07/10 0300) Last BM Date: 06/20/20 General:   Alert,   pleasant and cooperative in NAD; bedside ultrasound being performed.  He appears to be mentally intact oriented x4 However, he has prominent asterixis Lungs:  Clear throughout to auscultation.   No wheezes, crackles, or rhonchi. No acute distress. Heart:  Regular rate and rhythm; no murmurs, clicks, rubs,  or gallops. Abdomen: Obese/distended.  Positive bowel sounds soft no obvious organomegaly or shifting fluid wave.  Nontender.  Abdominal exam is limited by adipose.   Rectal:  Deferred until time of colonoscopy.   Msk:  Symmetrical without gross deformities. Normal posture. Pulses:  Normal pulses noted. Extremities: 2-3+ lower extremity edema.     Intake/Output from previous day: No intake/output data recorded. Intake/Output this shift: No intake/output data recorded.  Lab Results: Recent Labs    06/19/20 1325 06/20/20 1800 06/21/20 0608  WBC 5.4 3.6* 2.9*  HGB 10.5* 9.8* 9.4*  HCT 32.3* 31.0* 30.2*  PLT 80* 63* 58*   BMET Recent Labs    06/19/20 1325 06/20/20 1800 06/21/20 0608  NA 142 142 144  K 4.7 4.8 4.4  CL 109 112* 112*  CO2 21* 24 23  GLUCOSE 64* 120* 86  BUN 61* 59* 59*  CREATININE 2.08* 2.11* 2.08*  CALCIUM 9.0 8.8* 8.9   LFT Recent Labs    06/20/20 1800 06/20/20 1800 06/21/20 0608  PROT 7.1   < > 6.7  ALBUMIN 3.6   < > 3.3*  AST 27   < > 26  ALT 11   < > 10  ALKPHOS 45   < > 42  BILITOT 1.0   < > 1.1  BILIDIR 0.2  --   --   IBILI 0.8  --   --    < > = values in this interval not displayed.   PT/INR Recent Labs    06/20/20 1800  LABPROT 16.2*  INR 1.4*   Hepatitis Panel Recent Labs    06/20/20 1800  HEPBSAG NON REACTIVE  HCVAB PENDING  HEPAIGM PENDING  HEPBIGM PENDING     Impression: Complicated 59 year old gentleman with NASH/ASH cirrhosis with multiple co- morbidities including CAD, CHF, chronic kidney disease, diabetes, aortic stenosis admitted to the hospital with decompensation (anasarca).  He has pancytopenia. Some progression of chronic anemia.  No documented GI bleed.  Stools intermittently dark in the setting of chronic iron and Pepto-Bismol therapy He has known esophageal varices but has never bled on primary prophylaxis with nadolol. History of iron deficiency anemia.  We have never been able to accomplish a diagnostic colonoscopy in spite of multiple attempts.  His albumin is surprisingly normal at 3.6.  I do not feel he has had recent acute GI bleeding.  He is pancytopenic.  Likely multifactorial in etiology (i.e. inflammatory, cirrhosis, chronic disease).  Dark stools more likely related to ingestion of iron and Pepto-Bismol  Child's Pugh B cirrhotic  He has asterixis and likely has an element of hepatic encephalopathy.  Ammonia elevated.    Of note, preliminary bedside imaging on ultrasound reveals he has small dependent gallstones and no polyps.     Recommendations: No need for any urgent inpatient endoscopic evaluation at this time although patient does need a high-quality colonoscopy at some point to wrap up the evaluation of iron deficiency anemia.  We have not been able to accomplish that here.  I fully agree with pursuing a [facility] hepatology clinic evaluation.  I would also recommend that they pursue completing a high-quality colonoscopy at their institution.  We will add low-dose lactulose to his regimen with the goal of 3-4 semiformed stools daily.  Continue nadolol as primary prophylaxis against variceal hemorrhage with a target heart rate in the mid 50s.  If lack of gallbladder polyps is confirmed, then he would not need a cholecystectomy.  Overall prognosis is guarded.

## 2020-06-22 DIAGNOSIS — N179 Acute kidney failure, unspecified: Secondary | ICD-10-CM | POA: Diagnosis not present

## 2020-06-22 DIAGNOSIS — I5033 Acute on chronic diastolic (congestive) heart failure: Secondary | ICD-10-CM | POA: Diagnosis not present

## 2020-06-22 DIAGNOSIS — D61818 Other pancytopenia: Secondary | ICD-10-CM | POA: Diagnosis not present

## 2020-06-22 DIAGNOSIS — Z9989 Dependence on other enabling machines and devices: Secondary | ICD-10-CM | POA: Diagnosis not present

## 2020-06-22 DIAGNOSIS — I10 Essential (primary) hypertension: Secondary | ICD-10-CM | POA: Diagnosis not present

## 2020-06-22 DIAGNOSIS — K7469 Other cirrhosis of liver: Secondary | ICD-10-CM | POA: Diagnosis not present

## 2020-06-22 DIAGNOSIS — D631 Anemia in chronic kidney disease: Secondary | ICD-10-CM | POA: Diagnosis not present

## 2020-06-22 DIAGNOSIS — G4733 Obstructive sleep apnea (adult) (pediatric): Secondary | ICD-10-CM | POA: Diagnosis not present

## 2020-06-22 DIAGNOSIS — N1831 Chronic kidney disease, stage 3a: Secondary | ICD-10-CM | POA: Diagnosis not present

## 2020-06-22 DIAGNOSIS — E1165 Type 2 diabetes mellitus with hyperglycemia: Secondary | ICD-10-CM | POA: Diagnosis not present

## 2020-06-22 LAB — BASIC METABOLIC PANEL
Anion gap: 8 (ref 5–15)
BUN: 54 mg/dL — ABNORMAL HIGH (ref 6–20)
CO2: 24 mmol/L (ref 22–32)
Calcium: 9.2 mg/dL (ref 8.9–10.3)
Chloride: 111 mmol/L (ref 98–111)
Creatinine, Ser: 1.8 mg/dL — ABNORMAL HIGH (ref 0.61–1.24)
GFR calc Af Amer: 46 mL/min — ABNORMAL LOW (ref >60)
GFR calc non Af Amer: 40 mL/min — ABNORMAL LOW (ref >60)
Glucose, Bld: 118 mg/dL — ABNORMAL HIGH (ref 70–99)
Potassium: 4.5 mmol/L (ref 3.5–5.1)
Sodium: 143 mmol/L (ref 135–145)

## 2020-06-22 LAB — GLUCOSE, CAPILLARY
Glucose-Capillary: 101 mg/dL — ABNORMAL HIGH (ref 70–99)
Glucose-Capillary: 146 mg/dL — ABNORMAL HIGH (ref 70–99)
Glucose-Capillary: 119 mg/dL — ABNORMAL HIGH (ref 70–99)
Glucose-Capillary: 161 mg/dL — ABNORMAL HIGH (ref 70–99)

## 2020-06-22 NOTE — Progress Notes (Addendum)
PROGRESS NOTE    [name]  ZOX:[id number] DOB: February 26, 1961 DOA: 06/20/2020 PCP: [name], MD   Chief complaint: Fluid overload/anasarca, shortness of breath or orthopnea.  Brief Narrative:  As per H&P written by Dr. Waldron Labs on 06/20/2020  59 y.o. male, past medical history of chronic diastolic CHF, who does report worsening overload status, was recently seen by cardiology in June, where they have increased his Lasix to 80 mg oral daily, despite that he continues to have worsening volume overload, and increased weight gain, worsening lower extremity edema up to the thigh, scrotal edema, increased abdominal girth as well, does report some exertional dyspnea as well, he does report some cough and congestion recently started on p.o. antibiotics by PCP which he did not start taking it, he denies any chest pain, but he does report dyspnea, mainly exertional, he was seen today by cardiology for a follow-up, where his creatinine was noted to have increased to 2.08, he was significantly volume overloaded, and referred to the hospital for admission and IV diuresis.   Assessment & Plan: 1-fluid overload/anasarca: Multifactorial in the setting of cirrhosis and acute on chronic diastolic heart failure -Patient encouraged to follow low-sodium diet -Continue IV diuresis with Lasix and also the use of Aldactone (last 1 increase to 25 mg twice a day). -Continue to follow daily weight and history does not note -IV albumin will be also started. -Cardiology service and gastroenterology service. -weight down to 145 Kg.  2-Diabetes mellitus type 2, with nephropathy and transient uncontrolled hyperglycemia: -CBG in the 240's range on presentation. -Continue long-acting insulin and sliding scale insulin -Follow CBGs and further adjust hypoglycemic regimen as required. -A1C 6.8  3-acute on chronic stage IIIa chronic kidney disease -In the setting of heart failure exacerbation and concern for  hepatorenal syndrome -Continue diuresis and the use of albumin -Closely for electrolytes and renal function. -Minimize the use of nephrotoxic agents.  4-CAD (coronary artery disease) -No chest pain currently. -Continue the use of beta-blocker, aspirin and statins. -Telemetry monitoring in place.  5-OSA on CPAP -Continue CPAP nightly.  6-pancytopenia -In the setting of cirrhosis and anemia of chronic kidney disease -Continue patient follow-up with Dr. Delton Coombes for pancytopenia purposes and further work-up -Continue GI follow-up and referral to hepatology clinic in [place]. -No signs of overt bleeding -Continue the use of nadolol and PPI.   7-NASH cirrhosis -Guarded prognosis as per GI evaluation -Plan is to pursued outpatient follow-up with [facility] hepatology clinic -Because of his cirrhosis is NASH. -Ammonia has been added -Continue Lasix, Aldactone, nadolol and PPI -per GI rec's started on low dose lactulose  8-morbid obesity -Body mass index is 44.91 kg/m. -low calorie diet and portion control discussed with patient   DVT prophylaxis: SCDs Code Status: Full code Family Communication: No family member at bedside. Disposition:   Status is: Inpatient  Dispo: The patient is from: Home              Anticipated d/c is to: To be determined; hopefully home.              Anticipated d/c date is: To be determined              Patient currently no medically stable for discharge; still with significant fluid overload findings, complaining of shortness of breath with exertion and having orthopnea.  Continue IV diuresis.     Consultants:   Cardiology service  GI service   Procedures:  See below for x-ray reports  Antimicrobials:  None  Subjective: Still with significant fluid overload findings on examination; reports shortness of breath on exertion.  No chest pain, no nausea, no vomiting.  Patient reports some orthopnea.  Objective: Vitals:   06/21/20 2131 06/22/20  0556 06/22/20 0700 06/22/20 1324  BP: 132/63 103/60  (!) 106/52  Pulse: 74 65  66  Resp: 17 17  18   Temp: 98.8 F (37.1 C) 98.1 F (36.7 C)  98.2 F (36.8 C)  TempSrc: Oral Oral  Oral  SpO2: 100% 100%  100%  Weight:   (!) 144 kg   Height:        Intake/Output Summary (Last 24 hours) at 06/22/2020 1356 Last data filed at 06/22/2020 1141 Gross per 24 hour  Intake 1407 ml  Output 1700 ml  Net -293 ml   Filed Weights   06/20/20 1834 06/21/20 0300 06/22/20 0700  Weight: (!) 144.9 kg (!) 145.2 kg (!) 144 kg    Examination: General exam: Alert, awake, oriented x 3; continue to have signs of fluid overload on physical examination; reports shortness of breath on exertion and orthopnea.  Good urine output and decrease in his weight appreciated. Respiratory system: Fair air movement bilaterally; decreased breath sounds at the bases.  No wheezing, no using accessory muscles.  No using oxygen supplementation. Cardiovascular system: S1 and S2, no rubs, no gallops, unable to assess JVD with body habitus. Gastrointestinal system: Abdomen is obese, nontender, positive bowel sounds.  No organomegaly appreciated.  Increased abdominal girth appreciated.   Central nervous system: Alert and oriented. No focal neurological deficits. Extremities: No cyanosis or clubbing.  3+ edema bilaterally appreciated on the way to his thighs. Skin: No petechiae; scrotal edema appreciated.  Foley catheter in place. Psychiatry: Judgement and insight appear normal. Mood & affect appropriate.   Data Reviewed: I have personally reviewed following labs and imaging studies  CBC: Recent Labs  Lab 06/19/20 1325 06/20/20 1800 06/21/20 0608  WBC 5.4 3.6* 2.9*  NEUTROABS 3.7  --   --   HGB 10.5* 9.8* 9.4*  HCT 32.3* 31.0* 30.2*  MCV 107.3* 109.5* 110.2*  PLT 80* 63* 58*    Basic Metabolic Panel: Recent Labs  Lab 06/19/20 1325 06/20/20 1800 06/21/20 0608 06/22/20 0620  NA 142 142 144 143  K 4.7 4.8 4.4 4.5    CL 109 112* 112* 111  CO2 21* 24 23 24   GLUCOSE 64* 120* 86 118*  BUN 61* 59* 59* 54*  CREATININE 2.08* 2.11* 2.08* 1.80*  CALCIUM 9.0 8.8* 8.9 9.2    GFR: Estimated Creatinine Clearance: 63 mL/min (A) (by C-G formula based on SCr of 1.8 mg/dL (H)).  Liver Function Tests: Recent Labs  Lab 06/19/20 1325 06/20/20 1800 06/21/20 0608  AST 28 27 26   ALT 11 11 10   ALKPHOS 47 45 42  BILITOT 1.1 1.0 1.1  PROT 7.4 7.1 6.7  ALBUMIN 3.9 3.6 3.3*    CBG: Recent Labs  Lab 06/21/20 1121 06/21/20 1624 06/21/20 2129 06/22/20 0734 06/22/20 1116  GLUCAP 67* 183* 246* 101* 146*     Recent Results (from the past 240 hour(s))  SARS Coronavirus 2 by RT PCR (hospital order, performed in [facility] hospital lab) Nasopharyngeal Nasopharyngeal Swab     Status: None   Collection Time: 06/20/20  5:30 PM   Specimen: Nasopharyngeal Swab  Result Value Ref Range Status   SARS Coronavirus 2 NEGATIVE NEGATIVE Final    Comment: (NOTE) SARS-CoV-2 target nucleic acids are NOT DETECTED.  The SARS-CoV-2 RNA  is generally detectable in upper and lower respiratory specimens during the acute phase of infection. The lowest concentration of SARS-CoV-2 viral copies this assay can detect is 250 copies / mL. A negative result does not preclude SARS-CoV-2 infection and should not be used as the sole basis for treatment or other patient management decisions.  A negative result may occur with improper specimen collection / handling, submission of specimen other than nasopharyngeal swab, presence of viral mutation(s) within the areas targeted by this assay, and inadequate number of viral copies (<250 copies / mL). A negative result must be combined with clinical observations, patient history, and epidemiological information.  Fact Sheet for Patients:   StrictlyIdeas.no  Fact Sheet for Healthcare Providers: BankingDealers.co.za  This test is not yet  approved or  cleared by the [place] FDA and has been authorized for detection and/or diagnosis of SARS-CoV-2 by FDA under an Emergency Use Authorization (EUA).  This EUA will remain in effect (meaning this test can be used) for the duration of the COVID-19 declaration under Section 564(b)(1) of the Act, 21 U.S.C. section 360bbb-3(b)(1), unless the authorization is terminated or revoked sooner.  Performed at [facility], [address], [place], [place] [address]      Radiology Studies: US Abdomen Complete  Result Date: 06/21/2020 CLINICAL DATA:  Morbid obesity, hepatic cirrhosis EXAM: ABDOMEN ULTRASOUND COMPLETE COMPARISON:  Prior abdominal ultrasound 01/15/2020 FINDINGS: Gallbladder: Multiple mobile echogenic foci within the gallbladder lumen consistent with cholelithiasis. The gallbladder wall is thickened at 4.6 mm. Per the sonographer, the sonographic Percell Miller sign was negative. Common bile duct: Diameter: Normal at 4 mm. Liver: Nodular hepatic contour. The hepatic parenchyma is diffusely heterogeneous with coarsening of the echotexture. No discrete hepatic lesion is identified. Small volume perihepatic ascites. Portal vein is patent on color Doppler imaging with normal direction of blood flow towards the liver. IVC: No abnormality visualized. Pancreas: Visualized portion unremarkable. Spleen: Splenomegaly. The spleen measures approximately 768 cubic cm in volume. There is small volume perisplenic ascites as well. Right Kidney: Length: 10.5 cm. Echogenicity within normal limits. No mass or hydronephrosis visualized. Left Kidney: Length: 11.7 cm. Echogenicity within normal limits. No mass or hydronephrosis visualized. Abdominal aorta: No evidence of aneurysm. Other findings: None. IMPRESSION: 1. Hepatic cirrhosis with evidence of portal hypertension (splenomegaly and mild ascites). 2. No discrete hepatic lesion identified. 3. Cholelithiasis. 4. Gallbladder wall thickening is nonspecific in the  setting of cirrhosis and can be related to the underlying liver pathology and/or hypoalbuminemia. Acute cholecystitis is not suspected. Electronically Signed   By: Jacqulynn Cadet M.D.   On: 06/21/2020 13:43   DG Chest Port 1 View  Result Date: 06/20/2020 CLINICAL DATA:  History of CHF with reported fluid overload EXAM: PORTABLE CHEST 1 VIEW COMPARISON:  Radiograph 02/14/2020, 02/01/2020 FINDINGS: Patient rotated in a left anterior obliquity. Postsurgical changes from prior sternotomy and CABG. Slight prominence of the cardiomediastinal silhouette compared to prior is likely related to projectional differences and portable technique. Calcified aorta. No focal consolidative opacity. Central vascular congestion but without convincing features of frank edema. No pneumothorax or visible effusion. No acute osseous or soft tissue abnormality. Degenerative changes are present in the imaged spine and shoulders. IMPRESSION: Central vascular congestion without frank edema or effusion at this time. Electronically Signed   By: Lovena Le M.D.   On: 06/20/2020 20:04    Scheduled Meds: . aspirin EC  81 mg Oral QHS  . cholecalciferol  5,000 Units Oral q AM  . ferrous sulfate  325 mg Oral q AM  . furosemide  40 mg Intravenous BID  . insulin aspart  0-15 Units Subcutaneous TID WC  . insulin aspart  0-5 Units Subcutaneous QHS  . insulin detemir  20 Units Subcutaneous QHS  . insulin detemir  40 Units Subcutaneous Daily  . lactulose  5 g Oral q morning - 10a  . loratadine  10 mg Oral q AM  . nadolol  20 mg Oral q AM  . pantoprazole  40 mg Oral Daily  . pravastatin  20 mg Oral QHS  . spironolactone  25 mg Oral BID   Continuous Infusions:    LOS: 2 days    Time spent: 35 minutes   Barton Dubois, MD [facility]   To contact the attending provider between 7A-7P or the covering provider during after hours 7P-7A, please log into the web site [email or web address] and access using universal [place]  password for that web site. If you do not have the password, please call the hospital operator.  06/22/2020, 1:56 PM

## 2020-06-23 DIAGNOSIS — N179 Acute kidney failure, unspecified: Secondary | ICD-10-CM | POA: Diagnosis not present

## 2020-06-23 DIAGNOSIS — I5033 Acute on chronic diastolic (congestive) heart failure: Secondary | ICD-10-CM | POA: Diagnosis not present

## 2020-06-23 DIAGNOSIS — G4733 Obstructive sleep apnea (adult) (pediatric): Secondary | ICD-10-CM | POA: Diagnosis not present

## 2020-06-23 DIAGNOSIS — Z9989 Dependence on other enabling machines and devices: Secondary | ICD-10-CM | POA: Diagnosis not present

## 2020-06-23 DIAGNOSIS — N1831 Chronic kidney disease, stage 3a: Secondary | ICD-10-CM | POA: Diagnosis not present

## 2020-06-23 DIAGNOSIS — K7469 Other cirrhosis of liver: Secondary | ICD-10-CM

## 2020-06-23 DIAGNOSIS — E1165 Type 2 diabetes mellitus with hyperglycemia: Secondary | ICD-10-CM | POA: Diagnosis not present

## 2020-06-23 DIAGNOSIS — K7460 Unspecified cirrhosis of liver: Secondary | ICD-10-CM

## 2020-06-23 DIAGNOSIS — D61818 Other pancytopenia: Secondary | ICD-10-CM | POA: Diagnosis not present

## 2020-06-23 DIAGNOSIS — I10 Essential (primary) hypertension: Secondary | ICD-10-CM | POA: Diagnosis not present

## 2020-06-23 DIAGNOSIS — D631 Anemia in chronic kidney disease: Secondary | ICD-10-CM | POA: Diagnosis not present

## 2020-06-23 LAB — CBC WITH DIFFERENTIAL/PLATELET
Abs Immature Granulocytes: 0 10*3/uL (ref 0.00–0.07)
Basophils Absolute: 0 10*3/uL (ref 0.0–0.1)
Basophils Relative: 0 %
Eosinophils Absolute: 0.1 10*3/uL (ref 0.0–0.5)
Eosinophils Relative: 3 %
HCT: 29.7 % — ABNORMAL LOW (ref 39.0–52.0)
Hemoglobin: 9.4 g/dL — ABNORMAL LOW (ref 13.0–17.0)
Immature Granulocytes: 0 %
Lymphocytes Relative: 17 %
Lymphs Abs: 0.7 10*3/uL (ref 0.7–4.0)
MCH: 35.1 pg — ABNORMAL HIGH (ref 26.0–34.0)
MCHC: 31.6 g/dL (ref 30.0–36.0)
MCV: 110.8 fL — ABNORMAL HIGH (ref 80.0–100.0)
Monocytes Absolute: 0.5 10*3/uL (ref 0.1–1.0)
Monocytes Relative: 12 %
Neutro Abs: 2.8 10*3/uL (ref 1.7–7.7)
Neutrophils Relative %: 68 %
Platelets: 54 10*3/uL — ABNORMAL LOW (ref 150–400)
RBC: 2.68 MIL/uL — ABNORMAL LOW (ref 4.22–5.81)
RDW: 14.5 % (ref 11.5–15.5)
WBC: 4.1 10*3/uL (ref 4.0–10.5)
nRBC: 0 % (ref 0.0–0.2)

## 2020-06-23 LAB — GLUCOSE, CAPILLARY
Glucose-Capillary: 73 mg/dL (ref 70–99)
Glucose-Capillary: 82 mg/dL (ref 70–99)
Glucose-Capillary: 109 mg/dL — ABNORMAL HIGH (ref 70–99)
Glucose-Capillary: 91 mg/dL (ref 70–99)

## 2020-06-23 LAB — HEPATIC FUNCTION PANEL
ALT: 9 U/L (ref 0–44)
AST: 21 U/L (ref 15–41)
Albumin: 4.6 g/dL (ref 3.5–5.0)
Alkaline Phosphatase: 34 U/L — ABNORMAL LOW (ref 38–126)
Bilirubin, Direct: 0.3 mg/dL — ABNORMAL HIGH (ref 0.0–0.2)
Indirect Bilirubin: 1.7 mg/dL — ABNORMAL HIGH (ref 0.3–0.9)
Total Bilirubin: 2 mg/dL — ABNORMAL HIGH (ref 0.3–1.2)
Total Protein: 7.3 g/dL (ref 6.5–8.1)

## 2020-06-23 LAB — BASIC METABOLIC PANEL
Anion gap: 8 (ref 5–15)
BUN: 58 mg/dL — ABNORMAL HIGH (ref 6–20)
CO2: 24 mmol/L (ref 22–32)
Calcium: 9.3 mg/dL (ref 8.9–10.3)
Chloride: 110 mmol/L (ref 98–111)
Creatinine, Ser: 1.82 mg/dL — ABNORMAL HIGH (ref 0.61–1.24)
GFR calc Af Amer: 46 mL/min — ABNORMAL LOW (ref >60)
GFR calc non Af Amer: 39 mL/min — ABNORMAL LOW (ref >60)
Glucose, Bld: 102 mg/dL — ABNORMAL HIGH (ref 70–99)
Potassium: 4.2 mmol/L (ref 3.5–5.1)
Sodium: 142 mmol/L (ref 135–145)

## 2020-06-23 LAB — PROTIME-INR
INR: 1.5 — ABNORMAL HIGH (ref 0.8–1.2)
Prothrombin Time: 17.9 seconds — ABNORMAL HIGH (ref 11.4–15.2)

## 2020-06-23 MED ORDER — ALBUMIN HUMAN 25 % IV SOLN
50.0000 g | Freq: Two times a day (BID) | INTRAVENOUS | Status: AC
  Administered 2020-06-23 – 2020-06-24 (×4): 50 g via INTRAVENOUS
  Filled 2020-06-23 (×4): qty 200

## 2020-06-23 MED ORDER — FUROSEMIDE 10 MG/ML IJ SOLN
60.0000 mg | Freq: Two times a day (BID) | INTRAMUSCULAR | Status: DC
  Administered 2020-06-23 – 2020-06-25 (×4): 60 mg via INTRAVENOUS
  Filled 2020-06-23 (×4): qty 6

## 2020-06-23 NOTE — Progress Notes (Signed)
PROGRESS NOTE    [name]  HAL:[id number] DOB: 25-Dec-1960 DOA: 06/20/2020 PCP: [name], MD   Chief complaint: Fluid overload/anasarca, shortness of breath or orthopnea.  Brief Narrative:  As per H&P written by Dr. Waldron Labs on 06/20/2020  59 y.o. male, past medical history of chronic diastolic CHF, who does report worsening overload status, was recently seen by cardiology in June, where they have increased his Lasix to 80 mg oral daily, despite that he continues to have worsening volume overload, and increased weight gain, worsening lower extremity edema up to the thigh, scrotal edema, increased abdominal girth as well, does report some exertional dyspnea as well, he does report some cough and congestion recently started on p.o. antibiotics by PCP which he did not start taking it, he denies any chest pain, but he does report dyspnea, mainly exertional, he was seen today by cardiology for a follow-up, where his creatinine was noted to have increased to 2.08, he was significantly volume overloaded, and referred to the hospital for admission and IV diuresis.   Assessment & Plan: 1-fluid overload/anasarca: Multifactorial in the setting of cirrhosis and acute on chronic diastolic heart failure -Patient encouraged to follow low-sodium diet; fluid restriction to 2 L requested. -Continue IV diuresis with Lasix (increase to 60 mg every 12 hours) and continue the use of Aldactone 25 mg twice a day.   -Continue to follow daily weight and history does not note -Continue IV albumin. -Cardiology service and gastroenterology service on board; appreciate recommendations and assistance. -Continue to follow electrolytes and renal function closely. -weight down to 142 Kg.  2-Diabetes mellitus type 2, with nephropathy and transient uncontrolled hyperglycemia: -CBG in the 240's range on presentation. -Continue long-acting insulin and sliding scale insulin -Follow CBGs and further adjust  hypoglycemic regimen as required. -A1C 6.8  3-acute on chronic stage IIIa chronic kidney disease -In the setting of heart failure exacerbation and concern for hepatorenal syndrome -Continue diuresis and the use of albumin -Closely for electrolytes and renal function. -Minimize the use of nephrotoxic agents.  4-CAD (coronary artery disease) -No chest pain currently. -Continue the use of beta-blocker, aspirin and statins. -Telemetry monitoring in place.  5-OSA on CPAP -Continue CPAP nightly.  6-pancytopenia -In the setting of cirrhosis and anemia of chronic kidney disease -Continue patient follow-up with Dr. Delton Coombes for pancytopenia purposes and further work-up -Continue GI follow-up and referral to hepatology clinic in [place]. -No signs of overt bleeding -Continue the use of nadolol and PPI.   7-NASH cirrhosis -Guarded prognosis as per GI evaluation -Plan is to pursued outpatient follow-up with [facility] hepatology clinic -Because of his cirrhosis is NASH. -Ammonia has been added -Continue Lasix, Aldactone, nadolol and PPI -per GI rec's started on low dose lactulose  8-morbid obesity -Body mass index is 44.41 kg/m. -low calorie diet and portion control discussed with patient   DVT prophylaxis: SCDs Code Status: Full code Family Communication: No family member at bedside. Disposition:   Status is: Inpatient  Dispo: The patient is from: Home              Anticipated d/c is to: To be determined; hopefully home.              Anticipated d/c date is: To be determined              Patient currently no medically stable for discharge; still with significant fluid overload findings, complaining of shortness of breath with exertion and having orthopnea.  Continue IV diuresis.  Consultants:   Cardiology service  GI service   Procedures:  See below for x-ray reports  Antimicrobials:  None  Subjective: Planing of shortness of breath with minimal exertion and  orthopnea; breathing is slowly improving as per his reports.  Good urine output reported.  No chest pain, no nausea, no vomiting, no abdominal pain.  No using oxygen supplementation at rest.  Objective: Vitals:   06/22/20 1324 06/22/20 2157 06/22/20 2335 06/23/20 0541  BP: (!) 106/52 122/70  107/67  Pulse: 66 66 68 66  Resp: 18 20 20 20   Temp: 98.2 F (36.8 C) 98.4 F (36.9 C)  99 F (37.2 C)  TempSrc: Oral Oral  Oral  SpO2: 100% 97% 96% 96%  Weight:    (!) 142.4 kg  Height:        Intake/Output Summary (Last 24 hours) at 06/23/2020 1127 Last data filed at 06/23/2020 6967 Gross per 24 hour  Intake 1200 ml  Output 2400 ml  Net -1200 ml   Filed Weights   06/21/20 0300 06/22/20 0700 06/23/20 0541  Weight: (!) 145.2 kg (!) 144 kg (!) 142.4 kg    Examination: General exam: Alert, awake, oriented x 3, no requiring oxygen supplementation at rest; is still expressing shortness of breath with minimal exertion and orthopnea.  Reports breathing is a slightly better.  Good urine output documented. Respiratory system: Fine crackles at the bases; positive rhonchi.  No using accessory muscle. Cardiovascular system: S1 and S2, no rubs, no gallops, unable to assess JVD with body habitus. Gastrointestinal system: Abdomen is obese, soft and nontender.  Increased abdominal girth appreciated on examination.  Positive bowel sounds. Central nervous system: Alert and oriented. No focal neurological deficits. Extremities: No cyanosis or clubbing; 2-3+ edema appreciated bilaterally with wrist thighs. Skin: No petechiae. Psychiatry: Judgement and insight appear normal. Mood & affect appropriate.    Data Reviewed: I have personally reviewed following labs and imaging studies  CBC: Recent Labs  Lab 06/19/20 1325 06/20/20 1800 06/21/20 0608 06/23/20 0958  WBC 5.4 3.6* 2.9* 4.1  NEUTROABS 3.7  --   --  2.8  HGB 10.5* 9.8* 9.4* 9.4*  HCT 32.3* 31.0* 30.2* 29.7*  MCV 107.3* 109.5* 110.2* 110.8*    PLT 80* 63* 58* 54*    Basic Metabolic Panel: Recent Labs  Lab 06/19/20 1325 06/20/20 1800 06/21/20 0608 06/22/20 0620 06/23/20 0613  NA 142 142 144 143 142  K 4.7 4.8 4.4 4.5 4.2  CL 109 112* 112* 111 110  CO2 21* 24 23 24 24   GLUCOSE 64* 120* 86 118* 102*  BUN 61* 59* 59* 54* 58*  CREATININE 2.08* 2.11* 2.08* 1.80* 1.82*  CALCIUM 9.0 8.8* 8.9 9.2 9.3    GFR: Estimated Creatinine Clearance: 62 mL/min (A) (by C-G formula based on SCr of 1.82 mg/dL (H)).  Liver Function Tests: Recent Labs  Lab 06/19/20 1325 06/20/20 1800 06/21/20 0608 06/23/20 0958  AST 28 27 26 21   ALT 11 11 10 9   ALKPHOS 47 45 42 34*  BILITOT 1.1 1.0 1.1 2.0*  PROT 7.4 7.1 6.7 7.3  ALBUMIN 3.9 3.6 3.3* 4.6    CBG: Recent Labs  Lab 06/22/20 1116 06/22/20 1620 06/22/20 2154 06/23/20 0745 06/23/20 1115  GLUCAP 146* 119* 161* 73 82     Recent Results (from the past 240 hour(s))  SARS Coronavirus 2 by RT PCR (hospital order, performed in [place] hospital lab) Nasopharyngeal Nasopharyngeal Swab     Status: None   Collection Time:  06/20/20  5:30 PM   Specimen: Nasopharyngeal Swab  Result Value Ref Range Status   SARS Coronavirus 2 NEGATIVE NEGATIVE Final    Comment: (NOTE) SARS-CoV-2 target nucleic acids are NOT DETECTED.  The SARS-CoV-2 RNA is generally detectable in upper and lower respiratory specimens during the acute phase of infection. The lowest concentration of SARS-CoV-2 viral copies this assay can detect is 250 copies / mL. A negative result does not preclude SARS-CoV-2 infection and should not be used as the sole basis for treatment or other patient management decisions.  A negative result may occur with improper specimen collection / handling, submission of specimen other than nasopharyngeal swab, presence of viral mutation(s) within the areas targeted by this assay, and inadequate number of viral copies (<250 copies / mL). A negative result must be combined with  clinical observations, patient history, and epidemiological information.  Fact Sheet for Patients:   StrictlyIdeas.no  Fact Sheet for Healthcare Providers: BankingDealers.co.za  This test is not yet approved or  cleared by the [place] FDA and has been authorized for detection and/or diagnosis of SARS-CoV-2 by FDA under an Emergency Use Authorization (EUA).  This EUA will remain in effect (meaning this test can be used) for the duration of the COVID-19 declaration under Section 564(b)(1) of the Act, 21 U.S.C. section 360bbb-3(b)(1), unless the authorization is terminated or revoked sooner.  Performed at [facility], [address], [place], [place] [address]      Radiology Studies: No results found.  Scheduled Meds: . aspirin EC  81 mg Oral QHS  . cholecalciferol  5,000 Units Oral q AM  . ferrous sulfate  325 mg Oral q AM  . furosemide  60 mg Intravenous BID  . insulin aspart  0-15 Units Subcutaneous TID WC  . insulin aspart  0-5 Units Subcutaneous QHS  . insulin detemir  20 Units Subcutaneous QHS  . insulin detemir  40 Units Subcutaneous Daily  . lactulose  5 g Oral q morning - 10a  . loratadine  10 mg Oral q AM  . nadolol  20 mg Oral q AM  . pantoprazole  40 mg Oral Daily  . pravastatin  20 mg Oral QHS  . spironolactone  25 mg Oral BID   Continuous Infusions: . albumin human       LOS: 3 days    Time spent: 35 minutes   Barton Dubois, MD [facility]   To contact the attending provider between 7A-7P or the covering provider during after hours 7P-7A, please log into the web site [email or web address] and access using universal [place] password for that web site. If you do not have the password, please call the hospital operator.  06/23/2020, 11:27 AM

## 2020-06-23 NOTE — Plan of Care (Signed)
  Problem: Acute Rehab PT Goals(only PT should resolve) Goal: Pt Will Go Supine/Side To Sit Flowsheets (Taken 06/23/2020 1421) Pt will go Supine/Side to Sit: with supervision Goal: Patient Will Transfer Sit To/From Stand Flowsheets (Taken 06/23/2020 1421) Patient will transfer sit to/from stand: with modified independence Goal: Pt Will Transfer Bed To Chair/Chair To Bed Flowsheets (Taken 06/23/2020 1421) Pt will Transfer Bed to Chair/Chair to Bed: with modified independence Goal: Pt Will Ambulate Flowsheets (Taken 06/23/2020 1421) Pt will Ambulate:  75 feet  with modified independence  with supervision  with rolling walker   2:22 PM, 06/23/20 Lonell Grandchild, MPT Physical Therapist with [facility] [phone] office [phone] mobile phone

## 2020-06-23 NOTE — Progress Notes (Addendum)
Subjective: No abdominal pain, N/V. Tolerating diet. 3 BMs yesterday but didn't make it to bedside commode with third one due to urgency. No overt GI bleeding. Feels edema is slightly improved. No confusion or mental status changes.   Objective: Vital signs in last 24 hours: Temp:  [98.2 F (36.8 C)-99 F (37.2 C)] 99 F (37.2 C) (07/12 0541) Pulse Rate:  [66-68] 66 (07/12 0541) Resp:  [18-20] 20 (07/12 0541) BP: (106-122)/(52-70) 107/67 (07/12 0541) SpO2:  [96 %-100 %] 96 % (07/12 0541) Weight:  [142.4 kg-144 kg] 142.4 kg (07/12 0541) Last BM Date: 06/22/20 General:   Alert and oriented, pleasant Head:  Normocephalic and atraumatic. Eyes:  No icterus, sclera clear. Conjuctiva pink.  Abdomen:  Bowel sounds present, obese, distended but soft, small amount of anasarca bilateral flanks, no TTP  Extremities:  With 2+ edema to thigh Neurologic:  Alert and  oriented x4;  Negative asterixis Psych:  Alert and cooperative. Normal mood and affect.  Intake/Output from previous day: 07/11 0701 - 07/12 0700 In: 1200 [P.O.:1200] Out: 2400 [Urine:2400] Intake/Output this shift: Total I/O In: 240 [P.O.:240] Out: -   Lab Results: Recent Labs    06/20/20 1800 06/21/20 0608  WBC 3.6* 2.9*  HGB 9.8* 9.4*  HCT 31.0* 30.2*  PLT 63* 58*   BMET Recent Labs    06/21/20 0608 06/22/20 0620 06/23/20 0613  NA 144 143 142  K 4.4 4.5 4.2  CL 112* 111 110  CO2 23 24 24   GLUCOSE 86 118* 102*  BUN 59* 54* 58*  CREATININE 2.08* 1.80* 1.82*  CALCIUM 8.9 9.2 9.3   LFT Recent Labs    06/20/20 1800 06/21/20 0608  PROT 7.1 6.7  ALBUMIN 3.6 3.3*  AST 27 26  ALT 11 10  ALKPHOS 45 42  BILITOT 1.0 1.1  BILIDIR 0.2  --   IBILI 0.8  --    PT/INR Recent Labs    06/20/20 1800  LABPROT 16.2*  INR 1.4*   Hepatitis Panel Recent Labs    06/20/20 1800  HEPBSAG NON REACTIVE  HCVAB NON REACTIVE  HEPAIGM NON REACTIVE  HEPBIGM NON REACTIVE     Studies/Results: US Abdomen  Complete  Result Date: 06/21/2020 CLINICAL DATA:  Morbid obesity, hepatic cirrhosis EXAM: ABDOMEN ULTRASOUND COMPLETE COMPARISON:  Prior abdominal ultrasound 01/15/2020 FINDINGS: Gallbladder: Multiple mobile echogenic foci within the gallbladder lumen consistent with cholelithiasis. The gallbladder wall is thickened at 4.6 mm. Per the sonographer, the sonographic Percell Miller sign was negative. Common bile duct: Diameter: Normal at 4 mm. Liver: Nodular hepatic contour. The hepatic parenchyma is diffusely heterogeneous with coarsening of the echotexture. No discrete hepatic lesion is identified. Small volume perihepatic ascites. Portal vein is patent on color Doppler imaging with normal direction of blood flow towards the liver. IVC: No abnormality visualized. Pancreas: Visualized portion unremarkable. Spleen: Splenomegaly. The spleen measures approximately 768 cubic cm in volume. There is small volume perisplenic ascites as well. Right Kidney: Length: 10.5 cm. Echogenicity within normal limits. No mass or hydronephrosis visualized. Left Kidney: Length: 11.7 cm. Echogenicity within normal limits. No mass or hydronephrosis visualized. Abdominal aorta: No evidence of aneurysm. Other findings: None. IMPRESSION: 1. Hepatic cirrhosis with evidence of portal hypertension (splenomegaly and mild ascites). 2. No discrete hepatic lesion identified. 3. Cholelithiasis. 4. Gallbladder wall thickening is nonspecific in the setting of cirrhosis and can be related to the underlying liver pathology and/or hypoalbuminemia. Acute cholecystitis is not suspected. Electronically Signed   By: Dellis Filbert.D.  On: 06/21/2020 13:43    Assessment: 59 year old male with cirrhosis due to NASH/ASH, multiple comorbidities, admitted with decompensation characterized by anasarca. Cardiology following due to concern for heart failure. Known history of IDA with worsening anemia from baseline but remains without overt GI bleeding. Mild  encephalopathy this admission and started on low-dose lactulose, which he has responded well to and with resolution of asterixis today.   Continues with notable anasarca, slow diuresing since admission. IV albumin runs started by hospitalist on 7/10, receiving 4 doses thus far. Notably, portal vein is patent on doppler imaging this admission. [place] 7/10 with small volume ascites. On physical exam, seems to be more consistent with anasarca than worsening ascites. Repeat limited US if concern for accumulating ascites.   Known Grade 2-3 esophageal varices and portal gastropathy on EGD Dec 2020, currently on outpatient bleeding prophylaxis with Nadolol 20 mg daily that continues while inpatient. Unable to complete adequate colonoscopy outpatient, which is recommended to be done at tertiary care facility.   History of 9 mm gallbladder polyp noted on US abdomen in Feb 2021 but not mentioned on this [place] inpatient. Recommending [facility] evaluation as outpatient. Will make sure this is in the works.   Overall, prognosis is guarded. Needs strict I/0s, with continued weights each morning.      Plan: Recheck CBC, HFP, INR today Continue albumin runs for now Strict I/O Continue 2 gram sodium diet Appreciate Cardiology involvement Will ensure outpatient [facility] evaluation is in the works Continue low dose lactulose Nadolol 20 mg daily PPI daily  Annitta Needs, PhD, ANP-BC [facility]     LOS: 3 days    06/23/2020, 6:58 AM

## 2020-06-23 NOTE — Evaluation (Signed)
Physical Therapy Evaluation Patient Details Name: [name] MRN: [id number] DOB: [birth year] Today's Date: 06/23/2020   History of Present Illness  [name]  is a 59 y.o. male, past medical history of chronic diastolic CHF, who does report worsening overload status, was recently seen by cardiology in June, where they have increased his Lasix to 80 mg oral daily, despite that he continues to have worsening volume overload, and increased weight gain, worsening lower extremity edema up to the thigh, scrotal edema, increased abdominal girth as well, does report some exertional dyspnea as well, he does report some cough and congestion recently started on p.o. antibiotics by PCP which he did not start taking it, he denies any chest pain, but he does report dyspnea, mainly exertional, he was seen today by cardiology for a follow-up, where his creatinine was noted to have increased to 2.08, he was significantly volume overloaded, where he gained 60 pounds since April, as well patient with abdominal ultrasound this February which suggesting cirrhosis, slightly nodular contour, as well he was noted to have thrombocytopenia, will you is following with oncology Dr. Delton Coombes regarding that, direct admission was requested by cardiology for IV diuresis.    Clinical Impression  Patient demonstrates slow labored movement for sitting up at bedside requiring assistance to move legs and pulling self to sitting, increased time with multiple attempts before completing sit to stands and ambulated in room/hallway without loss of balance.  Patient limited for gait training and tolerated sitting up in chair after therapy.  Patient will benefit from continued physical therapy in hospital and recommended venue below to increase strength, balance, endurance for safe ADLs and gait.     Follow Up Recommendations Home health PT;Supervision for mobility/OOB;Supervision - Intermittent    Equipment  Recommendations  None recommended by PT    Recommendations for Other Services       Precautions / Restrictions Precautions Precautions: Fall Restrictions Weight Bearing Restrictions: No      Mobility  Bed Mobility Overal bed mobility: Needs Assistance Bed Mobility: Supine to Sit     Supine to sit: Min assist     General bed mobility comments: slow labored movement requiring assistance to move legs, difficulty propping up on elbows with bed flat  Transfers Overall transfer level: Needs assistance Equipment used: Rolling walker (2 wheeled) Transfers: Sit to/from [facility] Sit to Stand: Supervision Stand pivot transfers: Supervision;Min guard       General transfer comment: increased time, labored  movement  Ambulation/Gait Ambulation/Gait assistance: Supervision;Min guard Gait Distance (Feet): 55 Feet Assistive device: Rolling walker (2 wheeled) Gait Pattern/deviations: Decreased step length - right;Decreased step length - left;Decreased stride length Gait velocity: decreased   General Gait Details: slow labored cadence without loss of balance, limited secondary to c/o fatigue  Stairs            Wheelchair Mobility    Modified Rankin (Stroke Patients Only)       Balance Overall balance assessment: Needs assistance Sitting-balance support: Feet supported;No upper extremity supported Sitting balance-Leahy Scale: Good Sitting balance - Comments: seated at EOB   Standing balance support: During functional activity;Bilateral upper extremity supported Standing balance-Leahy Scale: Fair Standing balance comment: using RW                             Pertinent Vitals/Pain Pain Assessment: No/denies pain    Home Living Family/patient expects to be discharged to:: Private residence Living Arrangements: Spouse/significant  other Available Help at Discharge: Family;Available 24 hours/day Type of Home: House Home Access: Stairs  to enter Entrance Stairs-Rails: Left Entrance Stairs-Number of Steps: 2 Home Layout: One level Home Equipment: Walker - 2 wheels;Cane - single point      Prior Function Level of Independence: Independent         Comments: Hydrographic surveyor, drives, 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     Hand Dominance   Dominant Hand: Right    Extremity/Trunk Assessment   Upper Extremity Assessment Upper Extremity Assessment: Generalized weakness    Lower Extremity Assessment Lower Extremity Assessment: Generalized weakness    Cervical / Trunk Assessment Cervical / Trunk Assessment: Normal  Communication   Communication: No difficulties  Cognition Arousal/Alertness: Awake/alert Behavior During Therapy: WFL for tasks assessed/performed Overall Cognitive Status: Within Functional Limits for tasks assessed                                        General Comments      Exercises     Assessment/Plan    PT Assessment Patient needs continued PT services  PT Problem List Decreased strength;Decreased activity tolerance;Decreased balance;Decreased mobility       PT Treatment Interventions Gait training;Stair training;Functional mobility training;Therapeutic activities;Therapeutic exercise;Patient/family education;Balance training    PT Goals (Current goals can be found in the Care Plan section)  Acute Rehab PT Goals Patient Stated Goal: return home with family to assist PT Goal Formulation: With patient Time For Goal Achievement: 06/27/20 Potential to Achieve Goals: Good    Frequency Min 3X/week   Barriers to discharge        Co-evaluation               AM-PAC PT "6 Clicks" Mobility  Outcome Measure Help needed turning from your back to your side while in a flat bed without using bedrails?: A Little Help needed moving from lying on your back to sitting on the side of a flat bed without using bedrails?: A Lot Help needed moving to  and from a bed to a chair (including a wheelchair)?: A Little Help needed standing up from a chair using your arms (e.g., wheelchair or bedside chair)?: A Little Help needed to walk in hospital room?: A Little Help needed climbing 3-5 steps with a railing? : A Little 6 Click Score: 17    End of Session   Activity Tolerance: Patient tolerated treatment well;Patient limited by fatigue Patient left: in chair;with call bell/phone within reach Nurse Communication: Mobility status PT Visit Diagnosis: Other abnormalities of gait and mobility (R26.89);Unsteadiness on feet (R26.81);Muscle weakness (generalized) (M62.81)    Time: 9417-4081 PT Time Calculation (min) (ACUTE ONLY): 35 min   Charges:   PT Evaluation $PT Eval Moderate Complexity: 1 Mod PT Treatments $Therapeutic Activity: 23-37 mins        2:20 PM, 06/23/20 Lonell Grandchild, MPT Physical Therapist with [facility] 336 [phone] office [phone] mobile phone

## 2020-06-23 NOTE — TOC Initial Note (Addendum)
Transition of Care [facility]) - Initial/Assessment Note   Patient Details  Name: [name] MRN: [id number] Date of Birth: 01-25-1961  Transition of Care [facility]) CM/SW Contact:    Sherie Don, LCSW Phone Number: 06/23/2020, 11:58 AM  Clinical Narrative: Patient is a 59 year old male who was admitted for acute on chronic heart failure with preserved ejection fraction. TOC received consult for CHF screening. PT recommended HHPT. CSW spoke with patient to complete assessment. Per patient, he resides at home with his wife, [name], and has a CPAP, walker, and cane at home. Patient reported he is mostly independent with his ADLs, but has some difficulty due to leg swelling caused by CHF. Patient reported he is able to afford his medications and takes these as prescribed, including those for CHF.  Patient reported he follow a heart healthy diet and consumes "very little salt." Patient also reported he restricts his fluid intake and weighs himself daily to monitor fluid retention. CSW discussed HHPT. Patient requested [facility] as he has had them in the past. CSW made referral to [facility] with [facility], but patient's insurance is not currently in-network. Referral also made to [place] with [facility] and [facility] with [facility]. [facility] unable to accept referral at this time. [facility] is currently reviewing the referral.  Addendum: 4:07pm: Tim with [facility] has accepted [facility] referral.  Expected Discharge Plan: [place] Barriers to Discharge: Continued Medical Work up  Patient Goals and CMS Choice Patient states their goals for this hospitalization and ongoing recovery are:: Discharge home with [facility] CMS Medicare.gov Compare Post Acute Care list provided to:: Patient Choice offered to / list presented to : Patient  Expected Discharge Plan and Services Expected Discharge Plan: [place] In-house Referral: Clinical Social Work Discharge Planning Services: NA Post Acute Care Choice:  [place] arrangements for the past 2 months: Single Family Home            DME Arranged: N/A DME Agency: NA HH Arranged: PT [place] Agency: [place] ([place]) Date [place]: 06/23/20 Representative spoke with at [place]: Vaughan Basta  Prior Living Arrangements/Services Living arrangements for the past 2 months: [place] Lives with:: Spouse Patient language and need for interpreter reviewed:: Yes Do you feel safe going back to the place where you live?: Yes      Need for Family Participation in Patient Care: No (Comment) Care giver support system in place?: Yes (comment) ([name] Weible (wife)) Current home services: DME (CPAP, walker, cane) Criminal Activity/Legal Involvement Pertinent to Current Situation/Hospitalization: No - Comment as needed  Activities of Daily Living Home Assistive Devices/Equipment: Cane (specify quad or straight), Walker (specify type) ADL Screening (condition at time of admission) Patient's cognitive ability adequate to safely complete daily activities?: Yes Is the patient deaf or have difficulty hearing?: No Does the patient have difficulty seeing, even when wearing glasses/contacts?: No Does the patient have difficulty concentrating, remembering, or making decisions?: No Patient able to express need for assistance with ADLs?: Yes Does the patient have difficulty dressing or bathing?: Yes Independently performs ADLs?: No Communication: Independent Dressing (OT): Needs assistance Is this a change from baseline?: Change from baseline, expected to last >3 days Grooming: Independent Feeding: Independent Bathing: Needs assistance Is this a change from baseline?: Change from baseline, expected to last >3 days Toileting: Needs assistance Is this a change from baseline?: Change from baseline, expected to last >3days In/Out Bed: Needs assistance Is this a change from  baseline?: Change from baseline, expected to last >3  days Walks in Home: Independent with device (comment) Does the patient have difficulty walking or climbing stairs?: Yes Weakness of Legs: Both Weakness of Arms/Hands: None  Permission Sought/Granted Permission sought to share information with : Chartered certified accountant granted to share info w AGENCY: [facility]  Emotional Assessment Appearance:: Appears stated age Attitude/Demeanor/Rapport: Engaged Affect (typically observed): Accepting Orientation: : Oriented to Self, Oriented to Place, Oriented to  Time, Oriented to Situation Alcohol / Substance Use: Not Applicable Psych Involvement: No (comment)  Admission diagnosis:  Liver failure ([place]) [K72.90] Patient Active Problem List   Diagnosis Date Noted  . Cirrhosis of liver ([place])   . Acute on chronic heart failure with preserved ejection fraction ([place])   . Liver failure ([place]) 06/20/2020  . Esophageal varices ([place]) 01/09/2020  . History of adenomatous polyp of colon 01/09/2020  . Elevated AST (SGOT) 08/23/2019  . Diarrhea 07/17/2019  . Hypersomnia, persistent 03/12/2019  . Anemia 01/18/2019  . Other pancytopenia ([place]) 01/18/2019  . Thrombocytopenia ([place]) 12/30/2018  . Polyclonal gammopathy 12/30/2018  . S/P total knee replacement, left 03/09/17 11/22/2017  . Primary osteoarthritis of left knee 03/09/2017  . History of nocturia 03/02/2017  . OSA on CPAP 03/02/2016  . Hypoxia, sleep related 03/02/2016  . Cyst of mediastinum 01/15/2016  . S/P CABG x 4 08/29/2015  . CAD (coronary artery disease)   . Morbid obesity ([place])   . Hyperglycemia   . Pain in the chest 08/23/2015  . Non-ST elevation MI (NSTEMI) ([place]) 08/23/2015  . Diabetes mellitus type 2, uncontrolled ([place]) 08/23/2015  . Hypertension 08/23/2015  . Hyperlipidemia 08/23/2015  . Osteoarthritis, knee 05/24/2012  . Knee pain 10/29/2011  . Knee stiffness 10/29/2011  . S/P right knee arthroscopy 10/26/2011  . Medial meniscus, posterior horn derangement  09/07/2011  . Lateral meniscus derangement 09/07/2011  . OA (osteoarthritis) of knee 09/07/2011  . Rotator cuff syndrome of left shoulder 08/19/2011  . Right knee meniscal tear 08/19/2011   PCP:  [name], MD Pharmacy:   [facility], [address] [place] [address] Phone: [phone] Fax: [facility], [address] [place] [address] Phone: [phone] Fax: [phone]  Readmission Risk Interventions No flowsheet data found.

## 2020-06-23 NOTE — Progress Notes (Signed)
Pt reported 9/10 pain to center of lower back. PRN medication given (see mar)

## 2020-06-23 NOTE — Progress Notes (Addendum)
Progress Note  Patient Name: [name] Date of Encounter: 06/23/2020  [facility] Cardiologist: Rozann Lesches, MD   Subjective   Breathing a little better.  Inpatient Medications    Scheduled Meds: . aspirin EC  81 mg Oral QHS  . cholecalciferol  5,000 Units Oral q AM  . ferrous sulfate  325 mg Oral q AM  . furosemide  40 mg Intravenous BID  . insulin aspart  0-15 Units Subcutaneous TID WC  . insulin aspart  0-5 Units Subcutaneous QHS  . insulin detemir  20 Units Subcutaneous QHS  . insulin detemir  40 Units Subcutaneous Daily  . lactulose  5 g Oral q morning - 10a  . loratadine  10 mg Oral q AM  . nadolol  20 mg Oral q AM  . pantoprazole  40 mg Oral Daily  . pravastatin  20 mg Oral QHS  . spironolactone  25 mg Oral BID   Continuous Infusions: . albumin human     PRN Meds: albuterol, traMADol   Vital Signs    Vitals:   06/22/20 1324 06/22/20 2157 06/22/20 2335 06/23/20 0541  BP: (!) 106/52 122/70  107/67  Pulse: 66 66 68 66  Resp: 18 20 20 20   Temp: 98.2 F (36.8 C) 98.4 F (36.9 C)  99 F (37.2 C)  TempSrc: Oral Oral  Oral  SpO2: 100% 97% 96% 96%  Weight:    (!) 142.4 kg  Height:        Intake/Output Summary (Last 24 hours) at 06/23/2020 0755 Last data filed at 06/23/2020 0629 Gross per 24 hour  Intake 1200 ml  Output 2400 ml  Net -1200 ml   Last 3 Weights 06/23/2020 06/22/2020 06/21/2020  Weight (lbs) 313 lb 15 oz 317 lb 7.4 oz 320 lb 1.7 oz  Weight (kg) 142.4 kg 144 kg 145.2 kg      Telemetry    NSR - Personally Reviewed  ECG       Physical Exam    GEN: No acute distress.   Neck: No JVD Cardiac: RRR, distant HS, no murmurs, rubs, or gallops.  Respiratory: decreased breath sounds but Clear to auscultation bilaterally. GI: distended anasarca MS: plus 3 edema; No deformity. Neuro:  Nonfocal  Psych: Normal affect   Labs    High Sensitivity Troponin:  No results for input(s): TROPONINIHS in the last 720 hours.     Chemistry Recent Labs  Lab 06/19/20 1325 06/19/20 1325 06/20/20 1800 06/20/20 1800 06/21/20 0608 06/22/20 0620 06/23/20 0613  NA 142   < > 142   < > 144 143 142  K 4.7   < > 4.8   < > 4.4 4.5 4.2  CL 109   < > 112*   < > 112* 111 110  CO2 21*   < > 24   < > 23 24 24   GLUCOSE 64*   < > 120*   < > 86 118* 102*  BUN 61*   < > 59*   < > 59* 54* 58*  CREATININE 2.08*   < > 2.11*   < > 2.08* 1.80* 1.82*  CALCIUM 9.0   < > 8.8*   < > 8.9 9.2 9.3  PROT 7.4  --  7.1  --  6.7  --   --   ALBUMIN 3.9  --  3.6  --  3.3*  --   --   AST 28  --  27  --  26  --   --  ALT 11  --  11  --  10  --   --   ALKPHOS 47  --  45  --  42  --   --   BILITOT 1.1  --  1.0  --  1.1  --   --   GFRNONAA 34*   < > 33*   < > 34* 40* 39*  GFRAA 39*   < > 38*   < > 39* 46* 46*  ANIONGAP 12   < > 6   < > 9 8 8    < > = values in this interval not displayed.     Hematology Recent Labs  Lab 06/19/20 1325 06/20/20 1800 06/21/20 0608  WBC 5.4 3.6* 2.9*  RBC 3.01* 2.83* 2.74*  HGB 10.5* 9.8* 9.4*  HCT 32.3* 31.0* 30.2*  MCV 107.3* 109.5* 110.2*  MCH 34.9* 34.6* 34.3*  MCHC 32.5 31.6 31.1  RDW 14.5 14.6 14.7  PLT 80* 63* 58*    BNP Recent Labs  Lab 06/20/20 1800  BNP 601.0*     DDimer No results for input(s): DDIMER in the last 168 hours.   Radiology    US Abdomen Complete  Result Date: 06/21/2020 CLINICAL DATA:  Morbid obesity, hepatic cirrhosis EXAM: ABDOMEN ULTRASOUND COMPLETE COMPARISON:  Prior abdominal ultrasound 01/15/2020 FINDINGS: Gallbladder: Multiple mobile echogenic foci within the gallbladder lumen consistent with cholelithiasis. The gallbladder wall is thickened at 4.6 mm. Per the sonographer, the sonographic Percell Miller sign was negative. Common bile duct: Diameter: Normal at 4 mm. Liver: Nodular hepatic contour. The hepatic parenchyma is diffusely heterogeneous with coarsening of the echotexture. No discrete hepatic lesion is identified. Small volume perihepatic ascites. Portal vein is  patent on color Doppler imaging with normal direction of blood flow towards the liver. IVC: No abnormality visualized. Pancreas: Visualized portion unremarkable. Spleen: Splenomegaly. The spleen measures approximately 768 cubic cm in volume. There is small volume perisplenic ascites as well. Right Kidney: Length: 10.5 cm. Echogenicity within normal limits. No mass or hydronephrosis visualized. Left Kidney: Length: 11.7 cm. Echogenicity within normal limits. No mass or hydronephrosis visualized. Abdominal aorta: No evidence of aneurysm. Other findings: None. IMPRESSION: 1. Hepatic cirrhosis with evidence of portal hypertension (splenomegaly and mild ascites). 2. No discrete hepatic lesion identified. 3. Cholelithiasis. 4. Gallbladder wall thickening is nonspecific in the setting of cirrhosis and can be related to the underlying liver pathology and/or hypoalbuminemia. Acute cholecystitis is not suspected. Electronically Signed   By: Jacqulynn Cadet M.D.   On: 06/21/2020 13:43    Cardiac Studies   Echocardiogram 05/26/2020:  1. Left ventricular ejection fraction, by estimation, is 60 to 65%. The  left ventricle has normal function. The left ventricle has no regional  wall motion abnormalities. The left ventricular internal cavity size was  mildly dilated. There is mild left  ventricular hypertrophy. Left ventricular diastolic parameters are  indeterminate.   2. Right ventricular systolic function is normal. The right ventricular  size is normal. There is normal pulmonary artery systolic pressure.   3. The mitral valve is abnormal. Mild mitral valve regurgitation.   4. AV is thickened, calcifieid Difficult to see well. Peak and mean  gradients through the valve are 31 and 18 mm Hg respectively consistent  with mild AS. Compared to echo report from 2019, gradients are increased.  . The aortic valve is abnormal. Aortic  valve regurgitation is not visualized. Mild aortic valve stenosis.   5. The  inferior vena cava is dilated in size with <50%  respiratory  variability, suggesting right atrial pressure of 15 mmHg.    Carotid Dopplers 05/28/2019: IMPRESSION: Minimal amount of atherosclerotic plaque results in elevated peak systolic velocities within the bilateral internal carotid arteries compatible with the 50-69% luminal narrowing range bilaterally. Further evaluation with CTA could performed as clinically indicated.     Patient Profile     59 y.o. male with history of CAD, diastolic CHF, admitted with anasarca, 60 lb weight gain since April, cirrhosis.  Assessment & Plan    Acute on chronic diastolic CHF with anasarca and 60 lb weight gain since April not responding to Lasix oral . Now on IV 40 mg bid. Negative 1.6L weight down 319 to 313 lbs.Crt holding at 1.82. could try to increase Lasix 80 mg IV BID or continuous drip as discussed in Dr. Myles Gip note.   Acute on chronic renal insufficiency-CKD 3b  CAD CABG 2016 no angina   Mild AS  NASH cirrhosis receiving [employer] ascites on CT  Morbid obesity       For questions or updates, please contact [place] Please consult [email or web address] for contact info under        Signed, Ermalinda Barrios, PA-C  06/23/2020, 7:55 AM    Attending note Patient seen and disucssed with PA Bonnell Public, I agree with her documentation. Admitted from cardiolgy clinic anasarca with reported 60 lbs weight gain since April. Did not respond to oral lasix at home. Followed by GI for NASH   I/Os incomplete. He is on IV lasix 40mg  bid. Overall downtrend in Cr with diuresis. If weights are accurate would indicate he is down 7 lbs. I reviewed echo, agree with LVEF and diastolic being indeterminate. RV looks to be mildly enlarged with low normal function based on TAPSE and RV S'.   CXR central vascular congestion Abd [place]: cirrhosis with evidence of portal hypertension 05/2020 echo LVEF 13-24%, indet diastolic function, normal RV EKG  NSR  Ongoing significant fluid overload, conitnue IV diuretics. Increase lasix to 60mg  IV bid, albumin per primary team. Combined cirrhosis and HF with preserved ejection fraction.   Carlyle Dolly MD

## 2020-06-24 ENCOUNTER — Telehealth: Payer: Self-pay | Admitting: Gastroenterology

## 2020-06-24 ENCOUNTER — Ambulatory Visit: Payer: Federal, State, Local not specified - PPO | Admitting: Neurology

## 2020-06-24 DIAGNOSIS — D61818 Other pancytopenia: Secondary | ICD-10-CM | POA: Diagnosis not present

## 2020-06-24 DIAGNOSIS — K7460 Unspecified cirrhosis of liver: Secondary | ICD-10-CM

## 2020-06-24 DIAGNOSIS — Z9889 Other specified postprocedural states: Secondary | ICD-10-CM

## 2020-06-24 DIAGNOSIS — Z86010 Personal history of colonic polyps: Secondary | ICD-10-CM

## 2020-06-24 DIAGNOSIS — D509 Iron deficiency anemia, unspecified: Secondary | ICD-10-CM

## 2020-06-24 DIAGNOSIS — I10 Essential (primary) hypertension: Secondary | ICD-10-CM | POA: Diagnosis not present

## 2020-06-24 DIAGNOSIS — G4733 Obstructive sleep apnea (adult) (pediatric): Secondary | ICD-10-CM | POA: Diagnosis not present

## 2020-06-24 DIAGNOSIS — N179 Acute kidney failure, unspecified: Secondary | ICD-10-CM | POA: Diagnosis not present

## 2020-06-24 DIAGNOSIS — D631 Anemia in chronic kidney disease: Secondary | ICD-10-CM | POA: Diagnosis not present

## 2020-06-24 DIAGNOSIS — Z9989 Dependence on other enabling machines and devices: Secondary | ICD-10-CM | POA: Diagnosis not present

## 2020-06-24 DIAGNOSIS — K7469 Other cirrhosis of liver: Secondary | ICD-10-CM | POA: Diagnosis not present

## 2020-06-24 DIAGNOSIS — E1165 Type 2 diabetes mellitus with hyperglycemia: Secondary | ICD-10-CM | POA: Diagnosis not present

## 2020-06-24 DIAGNOSIS — I5033 Acute on chronic diastolic (congestive) heart failure: Secondary | ICD-10-CM | POA: Diagnosis not present

## 2020-06-24 DIAGNOSIS — N1831 Chronic kidney disease, stage 3a: Secondary | ICD-10-CM | POA: Diagnosis not present

## 2020-06-24 LAB — BASIC METABOLIC PANEL
Anion gap: 10 (ref 5–15)
BUN: 57 mg/dL — ABNORMAL HIGH (ref 6–20)
CO2: 23 mmol/L (ref 22–32)
Calcium: 9.2 mg/dL (ref 8.9–10.3)
Chloride: 107 mmol/L (ref 98–111)
Creatinine, Ser: 1.75 mg/dL — ABNORMAL HIGH (ref 0.61–1.24)
GFR calc Af Amer: 48 mL/min — ABNORMAL LOW (ref >60)
GFR calc non Af Amer: 41 mL/min — ABNORMAL LOW (ref >60)
Glucose, Bld: 76 mg/dL (ref 70–99)
Potassium: 4.1 mmol/L (ref 3.5–5.1)
Sodium: 140 mmol/L (ref 135–145)

## 2020-06-24 LAB — GLUCOSE, CAPILLARY
Glucose-Capillary: 66 mg/dL — ABNORMAL LOW (ref 70–99)
Glucose-Capillary: 69 mg/dL — ABNORMAL LOW (ref 70–99)
Glucose-Capillary: 158 mg/dL — ABNORMAL HIGH (ref 70–99)
Glucose-Capillary: 132 mg/dL — ABNORMAL HIGH (ref 70–99)
Glucose-Capillary: 134 mg/dL — ABNORMAL HIGH (ref 70–99)
Glucose-Capillary: 113 mg/dL — ABNORMAL HIGH (ref 70–99)

## 2020-06-24 MED ORDER — INSULIN DETEMIR 100 UNIT/ML ~~LOC~~ SOLN
30.0000 [IU] | Freq: Every day | SUBCUTANEOUS | Status: DC
  Administered 2020-06-25 – 2020-07-01 (×7): 30 [IU] via SUBCUTANEOUS
  Filled 2020-06-24 (×8): qty 0.3

## 2020-06-24 MED ORDER — INSULIN DETEMIR 100 UNIT/ML ~~LOC~~ SOLN
15.0000 [IU] | Freq: Every day | SUBCUTANEOUS | Status: DC
  Administered 2020-06-24 – 2020-06-30 (×7): 15 [IU] via SUBCUTANEOUS
  Filled 2020-06-24 (×8): qty 0.15

## 2020-06-24 NOTE — Progress Notes (Addendum)
Inpatient Diabetes Program Recommendations  AACE/ADA: New Consensus Statement on Inpatient Glycemic Control (2015)  Target Ranges:  Prepandial:   less than 140 mg/dL      Peak postprandial:   less than 180 mg/dL (1-2 hours)      Critically ill patients:  140 - 180 mg/dL   Lab Results  Component Value Date   GLUCAP 132 (H) 06/24/2020   HGBA1C 6.8 (H) 06/20/2020    Review of Glycemic Control Results for AVIAN, KONIGSBERG" (MRN [id number]) as of 06/24/2020 11:34  Ref. Range 06/23/2020 21:01 06/24/2020 07:38 06/24/2020 08:03 06/24/2020 09:28 06/24/2020 11:17  Glucose-Capillary Latest Ref Range: 70 - 99 mg/dL 91 66 (L) 69 (L) 158 (H) 132 (H)   Diabetes history: DM2 Outpatient Diabetes medications: Tresiba 70 units am + 40 units hs + Novolog 5-30 units meal coverage Current orders for Inpatient glycemic control: Levemir 40 units am + 20 units pm + Novolog moderate correction tid + hs 0-5 units  Inpatient Diabetes Program Recommendations:   -Decrease pm Levemir to 15 units  -Decrease Novolog correction to sensitive tid + hs 0-5 units  Spoke with patient by phone and reviewed history of insulin regimen. Patient sees Dr. Chalmers Cater for diabetes management and has been doing well with Tresiba bid. Patient has no additional questions regarding diabetes.  Thank you, Nani Gasser. Keajah Killough, RN, MSN, CDE  Diabetes Coordinator Inpatient Glycemic Control Team Team Pager [phone] (8am-5pm) 06/24/2020 11:31 AM

## 2020-06-24 NOTE — Progress Notes (Signed)
Subjective: Feels swelling is improving. Abdomen is less tight. Had 3 loose BMs yesterday.1 BM thus far today. No brbp or melena. No N/V. Tolerating diet well. Brief abdominal discomfort related to lactulose prior to BM. Alert and oriented x 4.    Objective: Vital signs in last 24 hours: Temp:  [97.9 F (36.6 C)-98.8 F (37.1 C)] 98.1 F (36.7 C) (07/13 0442) Pulse Rate:  [64-70] 65 (07/13 0442) Resp:  [16] 16 (07/12 2242) BP: (122-135)/(62-75) 135/68 (07/13 0442) SpO2:  [97 %-100 %] 97 % (07/13 0442) Weight:  [138.4 kg] 138.4 kg (07/13 0500) Last BM Date: 06/24/20 General:   Alert and oriented, pleasant Head: Normocephalic and atraumatic.  Eyes:  No icterus, sclera clear. Conjuctiva pink.  Abdomen:  Bowel sounds present, obese abdomen, distended but seems fairly soft. Small amount of anasarca in the very low abdomen. No TTP.  Extremities:  With 2+ pitting edema up to knees, no significant edema appreciated in thighs.  Neurologic:  Alert and  oriented x4;  grossly normal neurologically. Psych:  Normal mood and affect.  Intake/Output from previous day: 07/12 0701 - 07/13 0700 In: 798.3 [P.O.:720; IV Piggyback:78.3] Out: 1700 [Urine:1700] Intake/Output this shift: Total I/O In: -  Out: 200 [Urine:200]  Lab Results: Recent Labs    06/23/20 0958  WBC 4.1  HGB 9.4*  HCT 29.7*  PLT 54*   BMET Recent Labs    06/22/20 0620 06/23/20 0613 06/24/20 0558  NA 143 142 140  K 4.5 4.2 4.1  CL 111 110 107  CO2 24 24 23   GLUCOSE 118* 102* 76  BUN 54* 58* 57*  CREATININE 1.80* 1.82* 1.75*  CALCIUM 9.2 9.3 9.2   LFT Recent Labs    06/23/20 0958  PROT 7.3  ALBUMIN 4.6  AST 21  ALT 9  ALKPHOS 34*  BILITOT 2.0*  BILIDIR 0.3*  IBILI 1.7*   PT/INR Recent Labs    06/23/20 0958  LABPROT 17.9*  INR 1.5*    Assessment: 59 year old male with cirrhosis due to NASH/ASH, multiple comorbidities, admitted with decompensation characterized by anasarca. Cardiology  following due to concern for heart failure. Known history of IDA with worsening anemia from baseline but remains without overt GI bleeding and overall stable this admission. Mild encephalopathy this admission and started on low-dose lactulose, which he has responded well to and with resolution of asterixis. He remains alert and oriented x4. MELD 17 on admission, 19 today (bilirubin up slightly at 2 and INR up slightly at 1.5). Child Pugh B.   Continues with notable anasarca, slow diuresing since admission.  Down 14 pounds. IV albumin runs started by hospitalist on 7/10, receiving 6 doses thus far. Cardiology managing diuretics. Notably, portal vein is patent on doppler imaging this admission. [place] 7/10 with small volume ascites. On physical exam, seems to be more consistent with anasarca than worsening ascites. Consider repeat limited US if concern for accumulating ascites.   Known Grade 2-3 esophageal varices and portal gastropathy on EGD Dec 2020, currently on outpatient bleeding prophylaxis with Nadolol 20 mg daily that continues while inpatient.   IDA: Suspect IDA is multifactorial in the setting of chronic disease, CKD, and cirrhosis. Can't rule out colonic etiology as he has not been able to successfully complete colonoscopy as an outpatient after multiple attempts.  Is has been recommended for patient to proceed with colonoscopy at a tertiary care facility.  Doubt any significant GI bleed contributing to slight decline.  He does have known esophageal varices  but remains on bleeding prophylaxis with no history of bleed.  No need for inpatient procedures.  He will need colonoscopy outpatient.  Gallbladder polyp: History of 9 mm gallbladder polyp noted on US abdomen in Feb 2021 but not mentioned on this [place] inpatient. Recommending [facility] evaluation as outpatient. Will make sure this is in the works.   Plan: No need for procedures to evaluate IDA as hemoglobin has remained stable and he is without overt GI  bleeding. Monitor of any overt GI bleeding.  Continue to follow H/H Continue PPI daily. He will need outpatient colonoscopy at tertiary care center. Recommend pursuing at [facility]. Will need to ensure [facility] outpatient evaluation is in the works. Appreciate cardiology involvement. Continue diuretics per cardiology. Consider increasing Lasix to 80 mg twice daily tomorrow.  Could consider increasing spironolactone as he is currently only on 25 mg twice daily.  Somewhat difficult to manage diuretics in the setting of CKD. Continue 2 g sodium diet. Strict I/O Continue low-dose lactulose. Continue nadolol 20 mg daily. Repeat CBC, INR and HFP tomorrow.      LOS: 4 days    06/24/2020, 11:16 AM   Aliene Altes, [facility]

## 2020-06-24 NOTE — Progress Notes (Addendum)
Progress Note  Patient Name: [name] Date of Encounter: 06/24/2020  Primary Cardiologist: Rozann Lesches, MD   Subjective   Breathing improved. No chest pain or palpitations. Still with significant abdominal distension and lower extremity edema.   Inpatient Medications    Scheduled Meds: . aspirin EC  81 mg Oral QHS  . cholecalciferol  5,000 Units Oral q AM  . ferrous sulfate  325 mg Oral q AM  . furosemide  60 mg Intravenous BID  . insulin aspart  0-15 Units Subcutaneous TID WC  . insulin aspart  0-5 Units Subcutaneous QHS  . insulin detemir  20 Units Subcutaneous QHS  . insulin detemir  40 Units Subcutaneous Daily  . lactulose  5 g Oral q morning - 10a  . loratadine  10 mg Oral q AM  . nadolol  20 mg Oral q AM  . pantoprazole  40 mg Oral Daily  . pravastatin  20 mg Oral QHS  . spironolactone  25 mg Oral BID   Continuous Infusions: . albumin human 50 g (06/23/20 1538)   PRN Meds: albuterol, traMADol   Vital Signs    Vitals:   06/23/20 2100 06/23/20 2242 06/24/20 0442 06/24/20 0500  BP: 122/62  135/68   Pulse: 66 70 65   Resp:  16    Temp: 98.8 F (37.1 C)  98.1 F (36.7 C)   TempSrc: Oral  Oral   SpO2: 100% 98% 97%   Weight:    (!) 138.4 kg  Height:        Intake/Output Summary (Last 24 hours) at 06/24/2020 0857 Last data filed at 06/24/2020 0500 Gross per 24 hour  Intake 558.25 ml  Output 1700 ml  Net -1141.75 ml    Last 3 Weights 06/24/2020 06/23/2020 06/22/2020  Weight (lbs) 305 lb 1.9 oz 313 lb 15 oz 317 lb 7.4 oz  Weight (kg) 138.4 kg 142.4 kg 144 kg      Telemetry    NSR, HR in 60's to 70's. No significant arrhythmias.  - Personally Reviewed  ECG   No new tracings.   Physical Exam   General: Well developed, obese male appearing in no acute distress. Head: Normocephalic, atraumatic.  Neck: Supple without bruits, JVD not elevated. Lungs:  Resp regular and unlabored, decreased breath sounds along the bases  bilaterally. Heart: RRR, S1, S2, no S3, S4, or murmur; no rub. Abdomen: Soft, non-tender, non-distended with normoactive bowel sounds. No hepatomegaly. No rebound/guarding. No obvious abdominal masses. Extremities: No clubbing or cyanosis, 2+ pitting edema bilaterally. Distal pedal pulses are 2+ bilaterally. Neuro: Alert and oriented X 3. Moves all extremities spontaneously. Psych: Normal affect.  Labs    Chemistry Recent Labs  Lab 06/20/20 1800 06/20/20 1800 06/21/20 0608 06/21/20 0608 06/22/20 0620 06/23/20 0613 06/23/20 0958 06/24/20 0558  NA 142   < > 144   < > 143 142  --  140  K 4.8   < > 4.4   < > 4.5 4.2  --  4.1  CL 112*   < > 112*   < > 111 110  --  107  CO2 24   < > 23   < > 24 24  --  23  GLUCOSE 120*   < > 86   < > 118* 102*  --  76  BUN 59*   < > 59*   < > 54* 58*  --  57*  CREATININE 2.11*   < > 2.08*   < >  1.80* 1.82*  --  1.75*  CALCIUM 8.8*   < > 8.9   < > 9.2 9.3  --  9.2  PROT 7.1  --  6.7  --   --   --  7.3  --   ALBUMIN 3.6  --  3.3*  --   --   --  4.6  --   AST 27  --  26  --   --   --  21  --   ALT 11  --  10  --   --   --  9  --   ALKPHOS 45  --  42  --   --   --  34*  --   BILITOT 1.0  --  1.1  --   --   --  2.0*  --   GFRNONAA 33*   < > 34*   < > 40* 39*  --  41*  GFRAA 38*   < > 39*   < > 46* 46*  --  48*  ANIONGAP 6   < > 9   < > 8 8  --  10   < > = values in this interval not displayed.     Hematology Recent Labs  Lab 06/20/20 1800 06/21/20 0608 06/23/20 0958  WBC 3.6* 2.9* 4.1  RBC 2.83* 2.74* 2.68*  HGB 9.8* 9.4* 9.4*  HCT 31.0* 30.2* 29.7*  MCV 109.5* 110.2* 110.8*  MCH 34.6* 34.3* 35.1*  MCHC 31.6 31.1 31.6  RDW 14.6 14.7 14.5  PLT 63* 58* 54*    Cardiac EnzymesNo results for input(s): TROPONINI in the last 168 hours. No results for input(s): TROPIPOC in the last 168 hours.   BNP Recent Labs  Lab 06/20/20 1800  BNP 601.0*     DDimer No results for input(s): DDIMER in the last 168 hours.   Radiology    No results  found.  Cardiac Studies   Echocardiogram: 05/2020 IMPRESSIONS    1. Left ventricular ejection fraction, by estimation, is 60 to 65%. The  left ventricle has normal function. The left ventricle has no regional  wall motion abnormalities. The left ventricular internal cavity size was  mildly dilated. There is mild left  ventricular hypertrophy. Left ventricular diastolic parameters are  indeterminate.  2. Right ventricular systolic function is normal. The right ventricular  size is normal. There is normal pulmonary artery systolic pressure.  3. The mitral valve is abnormal. Mild mitral valve regurgitation.  4. AV is thickened, calcifieid Difficult to see well. Peak and mean  gradients through the valve are 31 and 18 mm Hg respectively consistent  with mild AS. Compared to echo report from 2019, gradients are increased.  . The aortic valve is abnormal. Aortic  valve regurgitation is not visualized. Mild aortic valve stenosis.  5. The inferior vena cava is dilated in size with <50% respiratory  variability, suggesting right atrial pressure of 15 mmHg.   Patient Profile     59 y.o. male w/ PMH of CAD (s/p CABG in 2016), HTN, HLD, Type 2 DM, Stage 3 CKD, OSA, thrombocytopenia and polyclonal gammopathy who was admitted directly from the office on 06/20/2020 for an acute CHF exacerbation as weight had increased by 60 lbs since 03/2020.  Assessment & Plan    1. Acute on Chronic Combined Diastolic CHF Exacerbation - weight was elevated to 319 lbs at the time of his office visit the day of admission and he reported worsening dyspnea, abdominal distension  and lower extremity edema. BNP elevated to 601 on admission and CXR showed vascular congestion.  - He was initially started on IV Lasix 40mg  BID and this was titrated to 60mg  BID yesterday and creatinine has improved from 1.82 to 1.75. Only received one dose of 60mg  yesterday so continue with dosing today. Pending repeat labs and output, can  possibly titrate to 80mg  BID tomorrow as he remains significantly volume overloaded. Recorded output of -2.7 L this admission but doubt the accuracy of this and I discussed with nursing staff who report he has been unable to use a urinal due to scrotal swelling. Weight has decreased from 320 lbs on admission to 305 lbs today (was previously 261 lbs in 03/2020).    2. CAD - He is s/p CABG in 2016. Denies any recent chest pain. He remains on ASA 81mg  daily, Nadolol 20mg  daily and Pravastatin 20mg  daily.   3. Aortic Stenosis - Mild by most recent echocardiogram in 05/2020. Continue to follow as an outpatient.   4. Liver Cirrhosis/NASH - Noted on imaging in 01/2020. Repeat Abdominal US this admission shows hepatic cirrhosis with evidence of portal HTN with splenomegaly and mild ascites. GI was consulted on this admission and recommended Hepatology [facility] at [facility]. He was started on low-dose Lactulose and continued on Nadolol.   5. Stage 3 CKD - baseline creatinine 1.3 - 1.4. Elevated at 2.08 on admission, improved to 1.75 today. Continue to follow with diuresis.   6. Weakness - He is significantly deconditioned and required 2 assists to the restroom. Evaluated by PT and Home Health PT recommended.    For questions or updates, please contact [place] Please consult [email or web address] for contact info under Cardiology/STEMI.   Arna Medici , PA-C 8:57 AM 06/24/2020 Pager: [phone]  Attending note Patient seen and discussed with PA Ahmed Prima, I agree with her documentation. Presented severely volume overloaded. Negative 985mL yesterday, neg 2.5 L since admission though I/Os data is incomplete. Weight appears inaccurate. Lasix increased to 60mg  IV bid just yeterday evening, Cr is trending down with diuresis consistent with venous congestion and CHF. Follow on IV lasix 60mg  bid, titrate lasix as needed to get net negative around 3 L per day.   Carlyle Dolly MD

## 2020-06-24 NOTE — Telephone Encounter (Signed)
[facility] clinical pool:  Please refer to [facility] for colonoscopy and hepatology evaluation. History of incomplete colonoscopies after several attempts. History of NASH/ASH.

## 2020-06-24 NOTE — Telephone Encounter (Signed)
Referral faxed to [facility] 

## 2020-06-24 NOTE — Progress Notes (Signed)
PROGRESS NOTE    [name]  ZOX:[id number] DOB: [birth year] DOA: 06/20/2020 PCP: [name], MD   Chief complaint: Fluid overload/anasarca, shortness of breath or orthopnea.  Brief Narrative:  As per H&P written by Dr. Waldron Labs on 06/20/2020  59 y.o. male, past medical history of chronic diastolic CHF, who does report worsening overload status, was recently seen by cardiology in June, where they have increased his Lasix to 80 mg oral daily, despite that he continues to have worsening volume overload, and increased weight gain, worsening lower extremity edema up to the thigh, scrotal edema, increased abdominal girth as well, does report some exertional dyspnea as well, he does report some cough and congestion recently started on p.o. antibiotics by PCP which he did not start taking it, he denies any chest pain, but he does report dyspnea, mainly exertional, he was seen today by cardiology for a follow-up, where his creatinine was noted to have increased to 2.08, he was significantly volume overloaded, and referred to the hospital for admission and IV diuresis.   Assessment & Plan: 1-fluid overload/anasarca: Multifactorial in the setting of cirrhosis and acute on chronic diastolic heart failure -Patient encouraged to follow low-sodium diet; fluid restriction to 2 L requested. -Continue IV diuresis with Lasix (increase to 60 mg every 12 hours) and continue the use of Aldactone 25 mg twice a day.   -Continue to follow daily weight and history does not note -Continue IV albumin. -Cardiology service and gastroenterology service on board; appreciate recommendations and assistance. -Continue to follow electrolytes and renal function closely. -weight down to 138.4 Kg.  2-Diabetes mellitus type 2, with nephropathy and transient uncontrolled hyperglycemia: -CBG in the 240's range on presentation. -Continue long-acting insulin and sliding scale insulin -Follow CBGs and further adjust  hypoglycemic regimen as required. -A1C 6.8  3-acute on chronic stage IIIa chronic kidney disease -In the setting of heart failure exacerbation and concern for hepatorenal syndrome -Continue diuresis and the use of albumin -Closely for electrolytes and renal function. -Minimize the use of nephrotoxic agents. -Creatinine down to 1.7  4-CAD (coronary artery disease) -No chest pain currently. -Continue the use of beta-blocker, aspirin and statins. -Telemetry monitoring in place.  5-OSA on CPAP -Continue CPAP nightly.  6-pancytopenia -In the setting of cirrhosis and anemia of chronic kidney disease -Continue patient follow-up with Dr. Delton Coombes for pancytopenia purposes and further work-up -Continue GI follow-up and referral to hepatology clinic in [place]. -No signs of overt bleeding -Continue the use of nadolol and PPI.   7-NASH cirrhosis -Guarded prognosis as per GI evaluation -Plan is to pursued outpatient follow-up with [facility] hepatology clinic -Because of his cirrhosis is NASH. -Ammonia has been added -Continue Lasix, Aldactone, nadolol and PPI -per GI rec's started on low dose lactulose  8-morbid obesity -Body mass index is 43.16 kg/m. -low calorie diet and portion control discussed with patient   DVT prophylaxis: SCDs Code Status: Full code Family Communication: No family member at bedside. Disposition:   Status is: Inpatient  Dispo: The patient is from: Home              Anticipated d/c is to: To be determined; hopefully home.              Anticipated d/c date is: To be determined              Patient currently no medically stable for discharge; still with significant fluid overload findings, complaining of shortness of breath with exertion and having orthopnea.  Continue  IV diuresis.     Consultants:   Cardiology service  GI service   Procedures:  See below for x-ray reports  Antimicrobials:  None  Subjective: Complaining of shortness of breath with  exertion and also having orthopnea; overall expressed breathing is improving.  No chest pain, no nausea, no vomiting, no fever.  No requiring oxygen supplementation.  Still massively fluid overloaded.  Objective: Vitals:   06/23/20 2100 06/23/20 2242 06/24/20 0442 06/24/20 0500  BP: 122/62  135/68   Pulse: 66 70 65   Resp:  16    Temp: 98.8 F (37.1 C)  98.1 F (36.7 C)   TempSrc: Oral  Oral   SpO2: 100% 98% 97%   Weight:    (!) 138.4 kg  Height:        Intake/Output Summary (Last 24 hours) at 06/24/2020 1326 Last data filed at 06/24/2020 1149 Gross per 24 hour  Intake 558.25 ml  Output 2400 ml  Net -1841.75 ml   Filed Weights   06/22/20 0700 06/23/20 0541 06/24/20 0500  Weight: (!) 144 kg (!) 142.4 kg (!) 138.4 kg    Examination: General exam: Alert, awake, oriented x 3, pain, no nausea, no vomiting, no palpitations.  Expressed that his breathing continued to improve.  Still with significant fluid overload on examination and expressing shortness of breath with activity and positive orthopnea. Respiratory system: Decreased breath sounds at the bases, no wheezing, no using accessory muscle.  Good O2 sat on room air. Cardiovascular system: Rate controlled, no rubs, no gallops, no murmurs; unable to assess JVD with body habitus. Gastrointestinal system: Abdomen is obese, nondistended, soft and nontender.  Positive bowel sounds. Central nervous system: Alert and oriented. No focal neurological deficits. Extremities: No cyanosis or clubbing; 2-3+ edema appreciated bilaterally. Skin: No rashes, no petechiae. Psychiatry: Judgement and insight appear normal. Mood & affect appropriate.   Data Reviewed: I have personally reviewed following labs and imaging studies  CBC: Recent Labs  Lab 06/19/20 1325 06/20/20 1800 06/21/20 0608 06/23/20 0958  WBC 5.4 3.6* 2.9* 4.1  NEUTROABS 3.7  --   --  2.8  HGB 10.5* 9.8* 9.4* 9.4*  HCT 32.3* 31.0* 30.2* 29.7*  MCV 107.3* 109.5* 110.2*  110.8*  PLT 80* 63* 58* 54*    Basic Metabolic Panel: Recent Labs  Lab 06/20/20 1800 06/21/20 0608 06/22/20 0620 06/23/20 0613 06/24/20 0558  NA 142 144 143 142 140  K 4.8 4.4 4.5 4.2 4.1  CL 112* 112* 111 110 107  CO2 24 23 24 24 23   GLUCOSE 120* 86 118* 102* 76  BUN 59* 59* 54* 58* 57*  CREATININE 2.11* 2.08* 1.80* 1.82* 1.75*  CALCIUM 8.8* 8.9 9.2 9.3 9.2    GFR: Estimated Creatinine Clearance: 63.4 mL/min (A) (by C-G formula based on SCr of 1.75 mg/dL (H)).  Liver Function Tests: Recent Labs  Lab 06/19/20 1325 06/20/20 1800 06/21/20 0608 06/23/20 0958  AST 28 27 26 21   ALT 11 11 10 9   ALKPHOS 47 45 42 34*  BILITOT 1.1 1.0 1.1 2.0*  PROT 7.4 7.1 6.7 7.3  ALBUMIN 3.9 3.6 3.3* 4.6    CBG: Recent Labs  Lab 06/23/20 2101 06/24/20 0738 06/24/20 0803 06/24/20 0928 06/24/20 1117  GLUCAP 91 66* 69* 158* 132*     Recent Results (from the past 240 hour(s))  SARS Coronavirus 2 by RT PCR (hospital order, performed in [facility] hospital lab) Nasopharyngeal Nasopharyngeal Swab     Status: None   Collection Time:  06/20/20  5:30 PM   Specimen: Nasopharyngeal Swab  Result Value Ref Range Status   SARS Coronavirus 2 NEGATIVE NEGATIVE Final    Comment: (NOTE) SARS-CoV-2 target nucleic acids are NOT DETECTED.  The SARS-CoV-2 RNA is generally detectable in upper and lower respiratory specimens during the acute phase of infection. The lowest concentration of SARS-CoV-2 viral copies this assay can detect is 250 copies / mL. A negative result does not preclude SARS-CoV-2 infection and should not be used as the sole basis for treatment or other patient management decisions.  A negative result may occur with improper specimen collection / handling, submission of specimen other than nasopharyngeal swab, presence of viral mutation(s) within the areas targeted by this assay, and inadequate number of viral copies (<250 copies / mL). A negative result must be combined with  clinical observations, patient history, and epidemiological information.  Fact Sheet for Patients:   StrictlyIdeas.no  Fact Sheet for Healthcare Providers: BankingDealers.co.za  This test is not yet approved or  cleared by the [place] FDA and has been authorized for detection and/or diagnosis of SARS-CoV-2 by FDA under an Emergency Use Authorization (EUA).  This EUA will remain in effect (meaning this test can be used) for the duration of the COVID-19 declaration under Section 564(b)(1) of the Act, 21 U.S.C. section 360bbb-3(b)(1), unless the authorization is terminated or revoked sooner.  Performed at [facility], [address], [place], [place] [address]      Radiology Studies: No results found.  Scheduled Meds: . aspirin EC  81 mg Oral QHS  . cholecalciferol  5,000 Units Oral q AM  . ferrous sulfate  325 mg Oral q AM  . furosemide  60 mg Intravenous BID  . insulin aspart  0-15 Units Subcutaneous TID WC  . insulin aspart  0-5 Units Subcutaneous QHS  . insulin detemir  15 Units Subcutaneous QHS  . [START ON 06/25/2020] insulin detemir  30 Units Subcutaneous Daily  . lactulose  5 g Oral q morning - 10a  . loratadine  10 mg Oral q AM  . nadolol  20 mg Oral q AM  . pantoprazole  40 mg Oral Daily  . pravastatin  20 mg Oral QHS  . spironolactone  25 mg Oral BID   Continuous Infusions: . albumin human 50 g (06/24/20 1220)     LOS: 4 days    Time spent: 35 minutes   Barton Dubois, MD [facility]   To contact the attending provider between 7A-7P or the covering provider during after hours 7P-7A, please log into the web site [email or web address] and access using universal [place] password for that web site. If you do not have the password, please call the hospital operator.  06/24/2020, 1:26 PM

## 2020-06-24 NOTE — Progress Notes (Signed)
Hypoglycemic Event  CBG: 66 @ 0738  Treatment: 4 oz juice given  Symptoms: None  Follow-up CBG: Time: 0803 CBG Result: 69  Follow-up CBG: Time: 0928   CBG Result: 158  Possible Reasons for Event: Unsue Comments/MD notified: Dyann Kief, [place] Debbie Yearick

## 2020-06-24 NOTE — Addendum Note (Signed)
Addended by: Cheron Every on: 06/24/2020 01:45 PM   Modules accepted: Orders

## 2020-06-25 DIAGNOSIS — D649 Anemia, unspecified: Secondary | ICD-10-CM | POA: Diagnosis not present

## 2020-06-25 DIAGNOSIS — I5033 Acute on chronic diastolic (congestive) heart failure: Secondary | ICD-10-CM | POA: Diagnosis not present

## 2020-06-25 DIAGNOSIS — R601 Generalized edema: Secondary | ICD-10-CM | POA: Diagnosis not present

## 2020-06-25 DIAGNOSIS — K7460 Unspecified cirrhosis of liver: Secondary | ICD-10-CM | POA: Diagnosis not present

## 2020-06-25 LAB — GLUCOSE, CAPILLARY
Glucose-Capillary: 87 mg/dL (ref 70–99)
Glucose-Capillary: 123 mg/dL — ABNORMAL HIGH (ref 70–99)
Glucose-Capillary: 147 mg/dL — ABNORMAL HIGH (ref 70–99)
Glucose-Capillary: 158 mg/dL — ABNORMAL HIGH (ref 70–99)

## 2020-06-25 LAB — CBC WITH DIFFERENTIAL/PLATELET
Abs Immature Granulocytes: 0.01 10*3/uL (ref 0.00–0.07)
Basophils Absolute: 0 10*3/uL (ref 0.0–0.1)
Basophils Relative: 0 %
Eosinophils Absolute: 0.1 10*3/uL (ref 0.0–0.5)
Eosinophils Relative: 3 %
HCT: 26.7 % — ABNORMAL LOW (ref 39.0–52.0)
Hemoglobin: 8.5 g/dL — ABNORMAL LOW (ref 13.0–17.0)
Immature Granulocytes: 0 %
Lymphocytes Relative: 26 %
Lymphs Abs: 0.9 10*3/uL (ref 0.7–4.0)
MCH: 34.4 pg — ABNORMAL HIGH (ref 26.0–34.0)
MCHC: 31.8 g/dL (ref 30.0–36.0)
MCV: 108.1 fL — ABNORMAL HIGH (ref 80.0–100.0)
Monocytes Absolute: 0.4 10*3/uL (ref 0.1–1.0)
Monocytes Relative: 12 %
Neutro Abs: 2 10*3/uL (ref 1.7–7.7)
Neutrophils Relative %: 59 %
Platelets: 49 10*3/uL — ABNORMAL LOW (ref 150–400)
RBC: 2.47 MIL/uL — ABNORMAL LOW (ref 4.22–5.81)
RDW: 13.9 % (ref 11.5–15.5)
WBC: 3.4 10*3/uL — ABNORMAL LOW (ref 4.0–10.5)
nRBC: 0 % (ref 0.0–0.2)

## 2020-06-25 LAB — HEPATIC FUNCTION PANEL
ALT: 9 U/L (ref 0–44)
AST: 17 U/L (ref 15–41)
Albumin: 4.7 g/dL (ref 3.5–5.0)
Alkaline Phosphatase: 32 U/L — ABNORMAL LOW (ref 38–126)
Bilirubin, Direct: 0.4 mg/dL — ABNORMAL HIGH (ref 0.0–0.2)
Indirect Bilirubin: 1.5 mg/dL — ABNORMAL HIGH (ref 0.3–0.9)
Total Bilirubin: 1.9 mg/dL — ABNORMAL HIGH (ref 0.3–1.2)
Total Protein: 7 g/dL (ref 6.5–8.1)

## 2020-06-25 LAB — BASIC METABOLIC PANEL
Anion gap: 9 (ref 5–15)
BUN: 55 mg/dL — ABNORMAL HIGH (ref 6–20)
CO2: 26 mmol/L (ref 22–32)
Calcium: 9.3 mg/dL (ref 8.9–10.3)
Chloride: 104 mmol/L (ref 98–111)
Creatinine, Ser: 1.76 mg/dL — ABNORMAL HIGH (ref 0.61–1.24)
GFR calc Af Amer: 48 mL/min — ABNORMAL LOW (ref >60)
GFR calc non Af Amer: 41 mL/min — ABNORMAL LOW (ref >60)
Glucose, Bld: 110 mg/dL — ABNORMAL HIGH (ref 70–99)
Potassium: 3.8 mmol/L (ref 3.5–5.1)
Sodium: 139 mmol/L (ref 135–145)

## 2020-06-25 LAB — PROTIME-INR
INR: 1.7 — ABNORMAL HIGH (ref 0.8–1.2)
Prothrombin Time: 19.4 seconds — ABNORMAL HIGH (ref 11.4–15.2)

## 2020-06-25 MED ORDER — FUROSEMIDE 10 MG/ML IJ SOLN
80.0000 mg | Freq: Two times a day (BID) | INTRAMUSCULAR | Status: DC
  Administered 2020-06-25 – 2020-06-30 (×10): 80 mg via INTRAVENOUS
  Filled 2020-06-25 (×10): qty 8

## 2020-06-25 MED ORDER — FUROSEMIDE 10 MG/ML IJ SOLN
20.0000 mg | Freq: Once | INTRAMUSCULAR | Status: AC
  Administered 2020-06-25: 20 mg via INTRAVENOUS
  Filled 2020-06-25: qty 2

## 2020-06-25 MED ORDER — VITAMIN K1 10 MG/ML IJ SOLN
10.0000 mg | Freq: Once | INTRAVENOUS | Status: AC
  Administered 2020-06-25: 10 mg via INTRAVENOUS
  Filled 2020-06-25: qty 1

## 2020-06-25 MED ORDER — LACTULOSE 10 GM/15ML PO SOLN
10.0000 g | Freq: Every day | ORAL | Status: DC
  Administered 2020-06-26: 10 g via ORAL
  Filled 2020-06-25: qty 30

## 2020-06-25 MED ORDER — LACTULOSE 10 GM/15ML PO SOLN
5.0000 g | Freq: Once | ORAL | Status: AC
  Administered 2020-06-25: 5 g via ORAL
  Filled 2020-06-25: qty 30

## 2020-06-25 NOTE — Progress Notes (Addendum)
Progress Note  Patient Name: [name] Date of Encounter: 06/25/2020  Primary Cardiologist: Rozann Lesches, MD   Subjective   Breathing improved. Still with lower extremity edema and abdominal distension. No chest pain or palpitations.    Inpatient Medications    Scheduled Meds: . aspirin EC  81 mg Oral QHS  . cholecalciferol  5,000 Units Oral q AM  . ferrous sulfate  325 mg Oral q AM  . furosemide  60 mg Intravenous BID  . insulin aspart  0-15 Units Subcutaneous TID WC  . insulin aspart  0-5 Units Subcutaneous QHS  . insulin detemir  15 Units Subcutaneous QHS  . insulin detemir  30 Units Subcutaneous Daily  . lactulose  5 g Oral q morning - 10a  . loratadine  10 mg Oral q AM  . nadolol  20 mg Oral q AM  . pantoprazole  40 mg Oral Daily  . pravastatin  20 mg Oral QHS  . spironolactone  25 mg Oral BID   Continuous Infusions:  PRN Meds: albuterol, traMADol   Vital Signs    Vitals:   06/24/20 2201 06/25/20 0140 06/25/20 0511 06/25/20 0600  BP: 123/68  135/63   Pulse: 70 68 71   Resp: 18  20   Temp:   98.3 F (36.8 C)   TempSrc:   Oral   SpO2:   96%   Weight:    135.3 kg  Height:        Intake/Output Summary (Last 24 hours) at 06/25/2020 0818 Last data filed at 06/25/2020 0100 Gross per 24 hour  Intake 860.1 ml  Output 2900 ml  Net -2039.9 ml    Last 3 Weights 06/25/2020 06/24/2020 06/23/2020  Weight (lbs) 298 lb 4.5 oz 305 lb 1.9 oz 313 lb 15 oz  Weight (kg) 135.3 kg 138.4 kg 142.4 kg      Telemetry    NSR, HR in 60's to 70's with occasional PVC's. Sometimes in a couplet pattern.  - Personally Reviewed   ECG    No new tracings.   Physical Exam   General: Well developed, well nourished, male appearing in no acute distress. Head: Normocephalic, atraumatic.  Neck: Supple without bruits, JVD not elevated. Lungs:  Resp regular and unlabored, decreased along bases bilaterally. Heart: RRR, S1, S2, no S3, S4, or murmur; no rub. Abdomen: Soft,  non-tender, appears distended. No hepatomegaly. No rebound/guarding. No obvious abdominal masses. Extremities: No clubbing or cyanosis. 2+ pitting edema bilaterally. Distal pedal pulses are 2+ bilaterally. Neuro: Alert and oriented X 3. Moves all extremities spontaneously. Psych: Normal affect.  Labs    Chemistry Recent Labs  Lab 06/21/20 [phone] 06/22/20 0620 06/23/20 2993 06/23/20 0958 06/24/20 0558 06/25/20 0605  NA 144   < > 142  --  140 139  K 4.4   < > 4.2  --  4.1 3.8  CL 112*   < > 110  --  107 104  CO2 23   < > 24  --  23 26  GLUCOSE 86   < > 102*  --  76 110*  BUN 59*   < > 58*  --  57* 55*  CREATININE 2.08*   < > 1.82*  --  1.75* 1.76*  CALCIUM 8.9   < > 9.3  --  9.2 9.3  PROT 6.7  --   --  7.3  --  7.0  ALBUMIN 3.3*  --   --  4.6  --  4.7  AST 26  --   --  21  --  17  ALT 10  --   --  9  --  9  ALKPHOS 42  --   --  34*  --  32*  BILITOT 1.1  --   --  2.0*  --  1.9*  GFRNONAA 34*   < > 39*  --  41* 41*  GFRAA 39*   < > 46*  --  48* 48*  ANIONGAP 9   < > 8  --  10 9   < > = values in this interval not displayed.     Hematology Recent Labs  Lab 06/21/20 5643 06/23/20 0958 06/25/20 0605  WBC 2.9* 4.1 3.4*  RBC 2.74* 2.68* 2.47*  HGB 9.4* 9.4* 8.5*  HCT 30.2* 29.7* 26.7*  MCV 110.2* 110.8* 108.1*  MCH 34.3* 35.1* 34.4*  MCHC 31.1 31.6 31.8  RDW 14.7 14.5 13.9  PLT 58* 54* 49*    Cardiac EnzymesNo results for input(s): TROPONINI in the last 168 hours. No results for input(s): TROPIPOC in the last 168 hours.   BNP Recent Labs  Lab 06/20/20 1800  BNP 601.0*     DDimer No results for input(s): DDIMER in the last 168 hours.   Radiology    No results found.  Cardiac Studies   Echocardiogram: 05/2020 IMPRESSIONS    1. Left ventricular ejection fraction, by estimation, is 60 to 65%. The  left ventricle has normal function. The left ventricle has no regional  wall motion abnormalities. The left ventricular internal cavity size was  mildly  dilated. There is mild left  ventricular hypertrophy. Left ventricular diastolic parameters are  indeterminate.  2. Right ventricular systolic function is normal. The right ventricular  size is normal. There is normal pulmonary artery systolic pressure.  3. The mitral valve is abnormal. Mild mitral valve regurgitation.  4. AV is thickened, calcifieid Difficult to see well. Peak and mean  gradients through the valve are 31 and 18 mm Hg respectively consistent  with mild AS. Compared to echo report from 2019, gradients are increased.  . The aortic valve is abnormal. Aortic  valve regurgitation is not visualized. Mild aortic valve stenosis.  5. The inferior vena cava is dilated in size with <50% respiratory  variability, suggesting right atrial pressure of 15 mmHg.   Patient Profile     59 y.o. male w/ PMH of CAD (s/p CABG in 2016), HTN, HLD, Type 2 DM, Stage 3 CKD, OSA, thrombocytopenia and polyclonal gammopathy who was admitted directly from the office on 06/20/2020 for an acute CHF exacerbation as weight had increased by 60 lbs since 03/2020.  Assessment & Plan    1. Acute on Chronic Combined Diastolic CHF Exacerbation - Weight was elevated to 319 lbs at the time of his office visit the day of admission and previously 260 lbs in 03/2020. BNP elevated to 601 on admission and CXR showed vascular congestion.  - I&O's not fully recorded as he has been unable to use a urinal due to scrotal swelling. Recorded output of -2.0 L yesterday (-4.5L this admission). Weight has declined to 298 lbs. He still remains significantly volume overloaded by examination. Will titrate IV Lasix from 60mg  BID to 80mg  BID. Repeat BMET in AM.   2. CAD - He is s/p CABG in 2016. He does have baseline dyspnea on exertion but denies any recent chest pain. Continue ASA (follow Hgb and platelets), BB and statin therapy.   3. Aortic  Stenosis - He did have mild AS by echocardiogram last month. Continue to follow as an  outpatient.   4. Liver Cirrhosis/NASH - Abdominal US this admission showed cirrhosis with evidence of portal HTN and mid ascites. He has been followed by GI and started on Lactulose with recommendations to continue Nadolol and Spironolactone. Referred to the Hepatology Clinic at [facility] for further evaluation. GI mentioned possibly titrating Spironolactone today as well. Recommended to at least wait until tomorrow given dose adjustment of Lasix today in the setting of his known Stage 3 CKD and low-normal BP.  - Per GI and the admitting team.   5. Stage 3 CKD - Baseline creatinine appears to be around 1.3 - 1.4. baseline creatinine 1.3 - 1.4. Elevated at 2.08 on admission, stable at 1.76 today (at 1.75 on 7/13).   6. Weakness - Being followed by PT with Home Health PT recommended at the time of discharge.   7. Pancytopenia - CBC this AM shows WBC 3.4, Hgb 8.5, and platelets 49K. Followed by Hematology as an outpatient. He did ask me to cancel his Outpatient Hematology appointment scheduled for tomorrow.    For questions or updates, please contact Echo Please consult [email or web address] for contact info under Cardiology/STEMI.   Arna Medici , PA-C 8:18 AM 06/25/2020 Pager: [phone]  Attending note Patient seen and disucssed with PA Strader, I agtee with her documentaiton. Presented with ansarca, signs of likely diastolic HF and cirrhosis. Neg 2 L yesterday, neg 4.5 L since admission. He is on IV lasix 60mg  bid, agree with increasing to 80mg  bid. Renal function is stable. Goal would be around net negative 3L per day. Remains severely volume overloaded, continue IV diuresis.    Carlyle Dolly MD

## 2020-06-25 NOTE — Progress Notes (Addendum)
Subjective: States he is feeling well. Swelling is improving. Abdomen is still distended but feels it is less tight. States he feels less bloated. 2 BMs yesterday. No BRBPR or melena. Denies abdominal pain, nausea, vomiting, GERD symptoms, or dysphagia. Tolerating his diet well. No hematuria noted in collection canister.   Objective: Vital signs in last 24 hours: Temp:  [98 F (36.7 C)-98.8 F (37.1 C)] 98.3 F (36.8 C) (07/14 0511) Pulse Rate:  [66-71] 71 (07/14 0511) Resp:  [18-20] 20 (07/14 0511) BP: (94-135)/(44-68) 135/63 (07/14 0511) SpO2:  [96 %-100 %] 96 % (07/14 0511) Weight:  [135.3 kg] 135.3 kg (07/14 0600) Last BM Date: 06/24/20 General:   Alert and oriented, pleasant Head:  Normocephalic and atraumatic. Eyes:  No icterus. Mild erythema around left eye with mild redness of sclera. Patient reports he has been rubbing this eye as he felt something was in it.    Abdomen:  Bowel sounds present. Abdomen is obese and appears distended but soft and non-tender.No rebound or guarding. No masses appreciated   Anasarca appreciated in the flanks bilaterally and mildly in the lower abdomen.  Extremities:  With 2+ bilateral LE pitting edema bilaterally.   Neurologic:  Alert and  oriented x4;  grossly normal neurologically. No asterixis.  Psych:  Normal mood and affect.  Intake/Output from previous day: 07/13 0701 - 07/14 0700 In: 860.1 [P.O.:480; IV Piggyback:380.1] Out: 2900 [Urine:2900] Intake/Output this shift: No intake/output data recorded.  Lab Results: Recent Labs    06/23/20 0958 06/25/20 0605  WBC 4.1 3.4*  HGB 9.4* 8.5*  HCT 29.7* 26.7*  PLT 54* 49*   BMET Recent Labs    06/23/20 0613 06/24/20 0558 06/25/20 0605  NA 142 140 139  K 4.2 4.1 3.8  CL 110 107 104  CO2 24 23 26   GLUCOSE 102* 76 110*  BUN 58* 57* 55*  CREATININE 1.82* 1.75* 1.76*  CALCIUM 9.3 9.2 9.3   LFT Recent Labs    06/23/20 0958 06/25/20 0605  PROT 7.3 7.0  ALBUMIN 4.6 4.7   AST 21 17  ALT 9 9  ALKPHOS 34* 32*  BILITOT 2.0* 1.9*  BILIDIR 0.3* 0.4*  IBILI 1.7* 1.5*   PT/INR Recent Labs    06/23/20 0958 06/25/20 0605  LABPROT 17.9* 19.4*  INR 1.5* 1.7*   Assessment: 59 year old male with cirrhosis due to NASH/ASH, multiple comorbidities, admitted with decompensation characterized by anasarca. Cardiology following due to concern for heart failure.Known history of IDA with worsening anemia from baseline butremainswithout overt GI bleeding.   Fluid overload: Multifactorial in the setting of acute on chronic CHF and cirrhosis. Notably, portal vein is patent on doppler imaging this admission.[place] 7/10 with small volume ascites without significant change in abdominal distension to suggest ascites. Clinically more consistent with anasarca. Continues with notable anasarca. He has been slowly diuresing this admission. Down a total of 21 lbs since admission and 4.57 L. Complicated by CKD with creatinine bumped to 2.11 on admission, now improved to 1.76. Cardiology has been managing diuretics. Increased Lasix to 60 mg BID yesterday and increasing to 80 mg BID today. Discussed with Bernerd Pho, PA with cardiology this morning regarding potential for adjusting spironolactone. She recommended increasing Lasix for now and consider increasing spironolactone tomorrow.    Cirrhosis: Secondary to NASH/ASH with decompensation characterized by fluid overload addressed above.  MELD was 11 when I saw him in January 2021.  MELD 17 on admission. MELD 20 today with INR 1.7, bilirubin 1.9, creatinine  1.76, sodium 139. Child Pugh B.  Mild hepatic encephalopathy on admission, now improved with low dose lactulose. Also with thrombocytopenia, known grade 2-3 esophageal varices and portal gastropathy on EGD in December 2020, currently on bleeding prophylaxis with nadolol 20 mg daily.  Colonoscopy in December 2020 with polyps, findings suggestive of portal colopathy, and rectal varices.   History of anemia that is multifactorial with decline in baseline, no overt GI bleeding, hemoglobin 8.5 today, down from 9.4 on 06/23/2020.  Doubt any significant GI bleed here. It is possible he could be using from somewhere in the setting of thrombocytopenia and mildly elevated INR.  May need to consider starting SBP prophylaxis with plans to continue this chronically.  I will discuss this with Dr. Gala Romney.  I will go ahead and order 1 dose of vitamin K today. Otherwise, regarding decompensated cirrhosis, we have recommended referral to [facility] hepatology and this process has been started.   Anemia/pancytopenia: Chronic since 2016.  Diagnosed with IDA in January 2020 with iron stores now within normal limits secondary to replacement. Hemoglobin down from baseline on admission with hemoglobin 9.8. Hemoglobin down to 8.5 today. Denies any overt GI bleeding. No other GI symptoms. Extensive evaluation with hematology in the past and currently following with Dr. Delton Coombes.  GI evaluation with negative celiac serologies, EGD and colonoscopy in December 2020.  EGD with 4 columns of grade 2-3 esophageal varices, portal gastropathy, gastric polyp/abnormal gastric mucosa s/p biopsy.  Pathology with hyperplastic polyp and chronic mild gastritis, negative H. pylori.  Colonoscopy with adequate prep but he did have 3 tubular adenomas and 1 hyperplastic polyp, findings suggestive of portal colopathy, rectal varices.  Right colon biopsy did reveal focal active colitis which was suspected to be secondary to self-limiting (infectious) colitis.  Overall suspect anemia is multifactorial secondary to inflammation, CKD, cirrhosis, possible slow oozing from the GI tract. Considering Bilirubin increased on 7/12 to 2.0 from 1.1 with primary elevation of indirect bilirubin, and he has splenomegaly, he could have mild hemolysis contributing to anemia. Total and indirect bilirubin slightly improved today which doesn't suggest any significant  hemolysis. Do not think there is a role for endoscopic evaluation at this time. He will need high quality colonoscopy in the future at [facility] as prior attempts locally have not been complete due to poor prep. Will discuss the role of starting chronic SBP prophylaxis considering possible slow occult GI blood loss. Will also order 1 dose of vitamin K today.    Gallbladder polyp: History of 9 mm gallbladder polyp noted on US abdomen in Feb 2021 but not mentioned on this [place] inpatient. Previously seen by [facility] who recommended referral to [facility].    Plan: 1. 10 mg IV vitamin K x 1 today.  2. Hold off on GI procedures to evaluate anemia at this time. He will need high quality TCS as outpatient with [facility].  4. Continue to monitor H/H and for any overt GI bleeding.  5. Continue PPI daily.  6. Referral has been placed to [place].  7. Continue low-dose lactulose. May need to consider increasing if he doesn't continue with 3 BMs daily.  8. Continue nadolol 20 mg daily. 9. Continue diuretics per cardiology. Lasix increased to 80 BID today. May consider adjusting spironolactone tomorrow if kidney function tolerates diuretic adjustments today.  10. Continue 2 g sodium diet. 11. Strict I/O   LOS: 5 days    06/25/2020, 9:10 AM    Attending note: Agree with the  assessment as outlined above.  Continue to titrate lactulose to 3-4 semiformed stools daily.  No need for SBE prophylaxis at this time.  Aliene Altes, PA-C [facility]

## 2020-06-25 NOTE — Progress Notes (Signed)
Purwick not working and bed pad and sheet soaked along with bath towel between legs.  Assist up to bedside commode to try to have bm and more urinary incontinence puddle in floor.  Did not have bm.

## 2020-06-25 NOTE — Progress Notes (Signed)
PROGRESS NOTE    [name]  YSA:[id number] DOB: Feb 04, 1961 DOA: 06/20/2020 PCP: [name], MD   Brief Narrative:  As per H&P written by Dr. Waldron Labs on 06/20/2020 59 y.o.male,past medical history of chronic diastolic CHF, who does report worsening overload status, was recently seen by cardiology in June, where they have increased his Lasix to 80 mg oral daily, despite that he continues to have worsening volume overload, and increased weight gain, worsening lower extremity edema up to the thigh, scrotal edema, increased abdominal girth as well, does report some exertional dyspnea as well, he does report some cough and congestion recently started on p.o. antibiotics by PCP which he did not start taking it, he denies any chest pain, but he does report dyspnea, mainly exertional, he was seen today by cardiology for a follow-up, where his creatinine was noted to have increased to 2.08, he was significantly volume overloaded, and referred to the hospital for admission and IV diuresis.  Assessment & Plan:   Active Problems:   Diabetes mellitus type 2, uncontrolled (HCC)   Hypertension   CAD (coronary artery disease)   OSA on CPAP   Anemia   Liver failure (HCC)   Cirrhosis of liver (HCC)   Acute on chronic heart failure with preserved ejection fraction (HCC)   1-fluid overload/anasarca: Multifactorial in the setting of cirrhosis and acute on chronic diastolic heart failure -Patient encouraged to follow low-sodium diet; fluid restriction to 2 L requested. -Continue IV diuresis with Lasix (increase to 80 mg every 12 hours) and continue the use of Aldactone 25 mg twice a day.   -Continue to follow daily weight and history does not note -Continue IV albumin. -Cardiology service and gastroenterology service on board; appreciate recommendations and assistance. -Continue to follow electrolytes and renal function closely. -weight down to 298 pounds with goal around 261  pounds  2-Diabetes mellitus type 2, with nephropathy and transient uncontrolled hyperglycemia-improved -CBG in the 240's range on presentation. -Continue long-acting insulin and sliding scale insulin -Follow CBGs and further adjust hypoglycemic regimen as required. -A1C 6.8  3-acute on chronic stage IIIa chronic kidney disease -In the setting of heart failure exacerbation and concern for hepatorenal syndrome -Continue diuresis and the use of albumin -Closely for electrolytes and renal function. -Minimize the use of nephrotoxic agents. -Creatinine down to 1.7  4-CAD (coronary artery disease) -No chest pain currently. -Continue the use of beta-blocker, aspirin and statins. -Telemetry monitoring in place.  5-OSA on CPAP -Continue CPAP nightly.  6-pancytopenia -In the setting of cirrhosis and anemia of chronic kidney disease -Continue patient follow-up with Dr. Delton Coombes for pancytopenia purposes and further work-up -Continue GI follow-up and referral to hepatology clinic in [place]. -No signs of overt bleeding -Continue the use of nadolol and PPI.   7-NASH cirrhosis -Guarded prognosis as per GI evaluation -Plan is to pursued outpatient follow-up with [facility] hepatology clinic -Because of his cirrhosis is NASH. -Ammonia has been added -Continue Lasix, Aldactone, nadolol and PPI -per GI rec's started on low dose lactulose -IV vitamin K x10 mg given 7/14  8-morbid obesity -Body mass index is 43.16 kg/m. -low calorie diet and portion control discussed with patient   DVT prophylaxis: SCDs Code Status: Full code Family Communication: No family member at bedside. Disposition:   Status is: Inpatient  Dispo: The patient is from: Home  Anticipated d/c is to: To be determined; hopefully home.  Anticipated d/c date is: To be determined  Patient currently no medically stable for discharge; still with  significant fluid overload findings,  complaining of shortness of breath with exertion and having orthopnea.  Continue IV diuresis likely for several more days.   Consultants:   Cardiology service  GI service   Procedures:  See below for x-ray reports  Antimicrobials:  None  Subjective: Patient seen and evaluated today with no new acute complaints or concerns. No acute concerns or events noted overnight.  He continues to diurese quite well.  Objective: Vitals:   06/24/20 2201 06/25/20 0140 06/25/20 0511 06/25/20 0600  BP: 123/68  135/63   Pulse: 70 68 71   Resp: 18  20   Temp:   98.3 F (36.8 C)   TempSrc:   Oral   SpO2:   96%   Weight:    135.3 kg  Height:        Intake/Output Summary (Last 24 hours) at 06/25/2020 1335 Last data filed at 06/25/2020 0100 Gross per 24 hour  Intake 620.1 ml  Output 2200 ml  Net -1579.9 ml   Filed Weights   06/23/20 0541 06/24/20 0500 06/25/20 0600  Weight: (!) 142.4 kg (!) 138.4 kg 135.3 kg    Examination:  General exam: Appears calm and comfortable  Respiratory system: Clear to auscultation. Respiratory effort normal.  Currently on room air Cardiovascular system: S1 & S2 heard, RRR. No JVD, murmurs, rubs, gallops or clicks.  Bilateral 2+ pitting edema Gastrointestinal system: Abdomen is obese and distended, soft and nontender. No organomegaly or masses felt. Normal bowel sounds heard. Central nervous system: Alert and oriented. No focal neurological deficits. Extremities: Significant bilateral edema noted Skin: No rashes, lesions or ulcers Psychiatry: Judgement and insight appear normal. Mood & affect appropriate.     Data Reviewed: I have personally reviewed following labs and imaging studies  CBC: Recent Labs  Lab 06/19/20 1325 06/20/20 1800 06/21/20 0608 06/23/20 0958 06/25/20 0605  WBC 5.4 3.6* 2.9* 4.1 3.4*  NEUTROABS 3.7  --   --  2.8 2.0  HGB 10.5* 9.8* 9.4* 9.4* 8.5*  HCT 32.3* 31.0* 30.2* 29.7* 26.7*  MCV 107.3* 109.5* 110.2* 110.8* 108.1*   PLT 80* 63* 58* 54* 49*   Basic Metabolic Panel: Recent Labs  Lab 06/21/20 0608 06/22/20 0620 06/23/20 0613 06/24/20 0558 06/25/20 0605  NA 144 143 142 140 139  K 4.4 4.5 4.2 4.1 3.8  CL 112* 111 110 107 104  CO2 23 24 24 23 26   GLUCOSE 86 118* 102* 76 110*  BUN 59* 54* 58* 57* 55*  CREATININE 2.08* 1.80* 1.82* 1.75* 1.76*  CALCIUM 8.9 9.2 9.3 9.2 9.3   GFR: Estimated Creatinine Clearance: 62.2 mL/min (A) (by C-G formula based on SCr of 1.76 mg/dL (H)). Liver Function Tests: Recent Labs  Lab 06/19/20 1325 06/20/20 1800 06/21/20 0608 06/23/20 0958 06/25/20 0605  AST 28 27 26 21 17   ALT 11 11 10 9 9   ALKPHOS 47 45 42 34* 32*  BILITOT 1.1 1.0 1.1 2.0* 1.9*  PROT 7.4 7.1 6.7 7.3 7.0  ALBUMIN 3.9 3.6 3.3* 4.6 4.7   No results for input(s): LIPASE, AMYLASE in the last 168 hours. Recent Labs  Lab 06/20/20 1800  AMMONIA 68*   Coagulation Profile: Recent Labs  Lab 06/20/20 1800 06/23/20 0958 06/25/20 0605  INR 1.4* 1.5* 1.7*   Cardiac Enzymes: No results for input(s): CKTOTAL, CKMB, CKMBINDEX, TROPONINI in the last 168 hours. BNP (last 3 results) No results for input(s): PROBNP in the last 8760 hours. HbA1C: No results for input(s): HGBA1C in the  last 72 hours. CBG: Recent Labs  Lab 06/24/20 1117 06/24/20 1617 06/24/20 2044 06/25/20 0750 06/25/20 1146  GLUCAP 132* 134* 113* 87 123*   Lipid Profile: No results for input(s): CHOL, HDL, LDLCALC, TRIG, CHOLHDL, LDLDIRECT in the last 72 hours. Thyroid Function Tests: No results for input(s): TSH, T4TOTAL, FREET4, T3FREE, THYROIDAB in the last 72 hours. Anemia Panel: No results for input(s): VITAMINB12, FOLATE, FERRITIN, TIBC, IRON, RETICCTPCT in the last 72 hours. Sepsis Labs: No results for input(s): PROCALCITON, LATICACIDVEN in the last 168 hours.  Recent Results (from the past 240 hour(s))  SARS Coronavirus 2 by RT PCR (hospital order, performed in [facility] hospital lab) Nasopharyngeal  Nasopharyngeal Swab     Status: None   Collection Time: 06/20/20  5:30 PM   Specimen: Nasopharyngeal Swab  Result Value Ref Range Status   SARS Coronavirus 2 NEGATIVE NEGATIVE Final    Comment: (NOTE) SARS-CoV-2 target nucleic acids are NOT DETECTED.  The SARS-CoV-2 RNA is generally detectable in upper and lower respiratory specimens during the acute phase of infection. The lowest concentration of SARS-CoV-2 viral copies this assay can detect is 250 copies / mL. A negative result does not preclude SARS-CoV-2 infection and should not be used as the sole basis for treatment or other patient management decisions.  A negative result may occur with improper specimen collection / handling, submission of specimen other than nasopharyngeal swab, presence of viral mutation(s) within the areas targeted by this assay, and inadequate number of viral copies (<250 copies / mL). A negative result must be combined with clinical observations, patient history, and epidemiological information.  Fact Sheet for Patients:   StrictlyIdeas.no  Fact Sheet for Healthcare Providers: BankingDealers.co.za  This test is not yet approved or  cleared by the [place] FDA and has been authorized for detection and/or diagnosis of SARS-CoV-2 by FDA under an Emergency Use Authorization (EUA).  This EUA will remain in effect (meaning this test can be used) for the duration of the COVID-19 declaration under Section 564(b)(1) of the Act, 21 U.S.C. section 360bbb-3(b)(1), unless the authorization is terminated or revoked sooner.  Performed at [facility], [address] [place], [place] [address]          Radiology Studies: No results found.      Scheduled Meds: . aspirin EC  81 mg Oral QHS  . cholecalciferol  5,000 Units Oral q AM  . ferrous sulfate  325 mg Oral q AM  . furosemide  80 mg Intravenous BID  . insulin aspart  0-15 Units Subcutaneous TID  WC  . insulin aspart  0-5 Units Subcutaneous QHS  . insulin detemir  15 Units Subcutaneous QHS  . insulin detemir  30 Units Subcutaneous Daily  . [START ON 06/26/2020] lactulose  10 g Oral Daily  . lactulose  5 g Oral Once  . loratadine  10 mg Oral q AM  . nadolol  20 mg Oral q AM  . pantoprazole  40 mg Oral Daily  . pravastatin  20 mg Oral QHS  . spironolactone  25 mg Oral BID    LOS: 5 days    Time spent: 35 minutes    Orlandria Kissner Darleen Crocker, DO [facility]  If 7PM-7AM, please contact night-coverage [email or web address] 06/25/2020, 1:35 PM

## 2020-06-26 ENCOUNTER — Ambulatory Visit (HOSPITAL_COMMUNITY): Payer: Federal, State, Local not specified - PPO | Admitting: Hematology

## 2020-06-26 DIAGNOSIS — R601 Generalized edema: Secondary | ICD-10-CM

## 2020-06-26 DIAGNOSIS — I5033 Acute on chronic diastolic (congestive) heart failure: Secondary | ICD-10-CM | POA: Diagnosis not present

## 2020-06-26 DIAGNOSIS — K7460 Unspecified cirrhosis of liver: Secondary | ICD-10-CM | POA: Diagnosis not present

## 2020-06-26 LAB — COMPREHENSIVE METABOLIC PANEL
ALT: 8 U/L (ref 0–44)
AST: 18 U/L (ref 15–41)
Albumin: 4.5 g/dL (ref 3.5–5.0)
Alkaline Phosphatase: 35 U/L — ABNORMAL LOW (ref 38–126)
Anion gap: 10 (ref 5–15)
BUN: 53 mg/dL — ABNORMAL HIGH (ref 6–20)
CO2: 24 mmol/L (ref 22–32)
Calcium: 9 mg/dL (ref 8.9–10.3)
Chloride: 105 mmol/L (ref 98–111)
Creatinine, Ser: 1.65 mg/dL — ABNORMAL HIGH (ref 0.61–1.24)
GFR calc Af Amer: 52 mL/min — ABNORMAL LOW (ref >60)
GFR calc non Af Amer: 44 mL/min — ABNORMAL LOW (ref >60)
Glucose, Bld: 91 mg/dL (ref 70–99)
Potassium: 3.8 mmol/L (ref 3.5–5.1)
Sodium: 139 mmol/L (ref 135–145)
Total Bilirubin: 1.8 mg/dL — ABNORMAL HIGH (ref 0.3–1.2)
Total Protein: 7 g/dL (ref 6.5–8.1)

## 2020-06-26 LAB — CBC
HCT: 27.7 % — ABNORMAL LOW (ref 39.0–52.0)
Hemoglobin: 9 g/dL — ABNORMAL LOW (ref 13.0–17.0)
MCH: 34.7 pg — ABNORMAL HIGH (ref 26.0–34.0)
MCHC: 32.5 g/dL (ref 30.0–36.0)
MCV: 106.9 fL — ABNORMAL HIGH (ref 80.0–100.0)
Platelets: 55 10*3/uL — ABNORMAL LOW (ref 150–400)
RBC: 2.59 MIL/uL — ABNORMAL LOW (ref 4.22–5.81)
RDW: 13.9 % (ref 11.5–15.5)
WBC: 3.5 10*3/uL — ABNORMAL LOW (ref 4.0–10.5)
nRBC: 0 % (ref 0.0–0.2)

## 2020-06-26 LAB — GLUCOSE, CAPILLARY
Glucose-Capillary: 85 mg/dL (ref 70–99)
Glucose-Capillary: 168 mg/dL — ABNORMAL HIGH (ref 70–99)
Glucose-Capillary: 103 mg/dL — ABNORMAL HIGH (ref 70–99)
Glucose-Capillary: 150 mg/dL — ABNORMAL HIGH (ref 70–99)

## 2020-06-26 LAB — PROTIME-INR
INR: 1.6 — ABNORMAL HIGH (ref 0.8–1.2)
Prothrombin Time: 18.5 seconds — ABNORMAL HIGH (ref 11.4–15.2)

## 2020-06-26 LAB — MAGNESIUM: Magnesium: 1.9 mg/dL (ref 1.7–2.4)

## 2020-06-26 MED ORDER — SPIRONOLACTONE 25 MG PO TABS
50.0000 mg | ORAL_TABLET | Freq: Two times a day (BID) | ORAL | Status: DC
  Administered 2020-06-26 – 2020-06-27 (×2): 50 mg via ORAL
  Filled 2020-06-26 (×2): qty 2

## 2020-06-26 MED ORDER — LACTULOSE 10 GM/15ML PO SOLN
20.0000 g | Freq: Every day | ORAL | Status: DC
  Administered 2020-06-27 – 2020-06-30 (×4): 20 g via ORAL
  Filled 2020-06-26 (×5): qty 30

## 2020-06-26 NOTE — Progress Notes (Addendum)
Progress Note  Patient Name: [name] Date of Encounter: 06/26/2020  Primary Cardiologist: Rozann Lesches, MD   Subjective   No orthopnea or PND overnight. Still with significant edema and abdominal distension. No chest pain or palpitations.   Inpatient Medications    Scheduled Meds: . aspirin EC  81 mg Oral QHS  . cholecalciferol  5,000 Units Oral q AM  . ferrous sulfate  325 mg Oral q AM  . furosemide  80 mg Intravenous BID  . insulin aspart  0-15 Units Subcutaneous TID WC  . insulin aspart  0-5 Units Subcutaneous QHS  . insulin detemir  15 Units Subcutaneous QHS  . insulin detemir  30 Units Subcutaneous Daily  . lactulose  10 g Oral Daily  . loratadine  10 mg Oral q AM  . nadolol  20 mg Oral q AM  . pantoprazole  40 mg Oral Daily  . pravastatin  20 mg Oral QHS  . spironolactone  25 mg Oral BID   Continuous Infusions:  PRN Meds: albuterol, traMADol   Vital Signs    Vitals:   06/25/20 2140 06/25/20 2340 06/26/20 0538 06/26/20 0600  BP: 125/67  (!) 119/56   Pulse: 65  64   Resp: 18  19   Temp: 98.2 F (36.8 C)  98 F (36.7 C)   TempSrc: Oral  Oral   SpO2: 100% 96% 96%   Weight:    (!) 136.3 kg  Height:        Intake/Output Summary (Last 24 hours) at 06/26/2020 0806 Last data filed at 06/26/2020 0600 Gross per 24 hour  Intake -  Output 1800 ml  Net -1800 ml    Last 3 Weights 06/26/2020 06/25/2020 06/24/2020  Weight (lbs) 300 lb 7.8 oz 298 lb 4.5 oz 305 lb 1.9 oz  Weight (kg) 136.3 kg 135.3 kg 138.4 kg      Telemetry    NSR, HR in 60's. No significant arrhythmias.  - Personally Reviewed  ECG    No new tracings.   Physical Exam   General: Well developed, obese male appearing in no acute distress. Head: Normocephalic, atraumatic.  Neck: Supple without bruits, JVD not elevated. Lungs:  Resp regular and unlabored, decreased breast sounds along bases bilaterally. Heart: RRR, S1, S2, no S3, S4, no rub. 2/6 SEM along RUSB.  Abdomen: Soft,  non-tender. No hepatomegaly. No rebound/guarding. No obvious abdominal masses. Appears distended. Extremities: No clubbing or cyanosis, 2+ pitting edema bilaterally. Distal pedal pulses are 2+ bilaterally. Neuro: Alert and oriented X 3. Moves all extremities spontaneously. Psych: Normal affect.  Labs    Chemistry Recent Labs  Lab 06/23/20 [phone] 06/23/20 0958 06/24/20 0558 06/25/20 0605 06/26/20 0555  NA   < >  --  140 139 139  K   < >  --  4.1 3.8 3.8  CL   < >  --  107 104 105  CO2   < >  --  23 26 24   GLUCOSE   < >  --  76 110* 91  BUN   < >  --  57* 55* 53*  CREATININE   < >  --  1.75* 1.76* 1.65*  CALCIUM   < >  --  9.2 9.3 9.0  PROT  --  7.3  --  7.0 7.0  ALBUMIN  --  4.6  --  4.7 4.5  AST  --  21  --  17 18  ALT  --  9  --  9 8  ALKPHOS  --  34*  --  32* 35*  BILITOT  --  2.0*  --  1.9* 1.8*  GFRNONAA   < >  --  41* 41* 44*  GFRAA   < >  --  48* 48* 52*  ANIONGAP   < >  --  10 9 10    < > = values in this interval not displayed.     Hematology Recent Labs  Lab 06/23/20 0958 06/25/20 0605 06/26/20 0555  WBC 4.1 3.4* 3.5*  RBC 2.68* 2.47* 2.59*  HGB 9.4* 8.5* 9.0*  HCT 29.7* 26.7* 27.7*  MCV 110.8* 108.1* 106.9*  MCH 35.1* 34.4* 34.7*  MCHC 31.6 31.8 32.5  RDW 14.5 13.9 13.9  PLT 54* 49* 55*    Cardiac EnzymesNo results for input(s): TROPONINI in the last 168 hours. No results for input(s): TROPIPOC in the last 168 hours.   BNP Recent Labs  Lab 06/20/20 1800  BNP 601.0*     DDimer No results for input(s): DDIMER in the last 168 hours.   Radiology    No results found.  Cardiac Studies   Echocardiogram: 05/2020 IMPRESSIONS    1. Left ventricular ejection fraction, by estimation, is 60 to 65%. The  left ventricle has normal function. The left ventricle has no regional  wall motion abnormalities. The left ventricular internal cavity size was  mildly dilated. There is mild left  ventricular hypertrophy. Left ventricular diastolic parameters  are  indeterminate.  2. Right ventricular systolic function is normal. The right ventricular  size is normal. There is normal pulmonary artery systolic pressure.  3. The mitral valve is abnormal. Mild mitral valve regurgitation.  4. AV is thickened, calcifieid Difficult to see well. Peak and mean  gradients through the valve are 31 and 18 mm Hg respectively consistent  with mild AS. Compared to echo report from 2019, gradients are increased.  . The aortic valve is abnormal. Aortic  valve regurgitation is not visualized. Mild aortic valve stenosis.  5. The inferior vena cava is dilated in size with <50% respiratory  variability, suggesting right atrial pressure of 15 mmHg.   Patient Profile     59 y.o. male w/ PMH of CAD (s/p CABG in 2016), HTN, HLD, Type 2 DM, Stage 3 CKD, OSA, thrombocytopenia and polyclonal gammopathy who was admitted directly from the office on 06/20/2020 for an acute CHF exacerbation as weight had increased by 60 lbs since 03/2020.  Assessment & Plan    1. Acute on Chronic Diastolic CHF Exacerbation - Weight was elevated to 319 lbs at the time of his office visit the day of admission and previously 260 lbs in 03/2020. BNP elevated to 601 on admission and CXR showed vascular congestion.  - IV Lasix was just titrated to 80mg  BID on 06/25/2020 and creatinine continued to improve (1.76 --> 1.65). I&O's have been inaccurate given difficulty with PurWick and scrotal edema. Recorded as - 6.3L thus far. Weight has declined by at least 20 lbs since admission. Listed as going up since yesterday but doubt the accuracy of this. Will ask for a standing weight to be obtained later this morning.   2. CAD - He is s/p CABG in 2016. No recent anginal symptoms. Continue ASA 81mg  daily, Nadolol 20mg  daily and Pravastatin 20mg  daily.   3. Aortic Stenosis - Mild AS by echo in 05/2020. Continue to follow.   4. Liver Cirrhosis/NASH - Abdominal US has shown cirrhosis with portal HTN and  ascites.  Remains on Nadolol and Spironolactone. Referred to the Hepatology Clinic at [facility] for further evaluation. - Would hold on further titration of Spironolactone for now given his softer BP (at 108/64 yesterday afternoon). Will likely be able to further titrate once no longer on high-dose Lasix.  - Per GI and the admitting team.   5. Stage 3 CKD - His baseline creatinine appears to be 1.3 - 1.4. Elevated at 2.08 on admission, improved to 1.65 today.   6. Weakness - Home Health PT has been recommended by Physical Therapy.   7. Pancytopenia - CBC this AM shows WBC 3.5, Hgb 9.0 and platelets 55K. Followed by Hematology as an outpatient.    For questions or updates, please contact [place] Please consult [email or web address] for contact info under Cardiology/STEMI.   Signed, Erma Heritage , PA-C 8:06 AM 06/26/2020 Pager: [phone]  Pt seen and examined   I agree with findngs as noted by B Strader above   Pt comfortable laying in bed   He remains significantly volume overloaded  On exam: Lungs aer CTA Cardiac RRR  No S3 Abd Distended   Minimally tender Ext with 1-2+ edema      Continue diuresis   I would recomm increasing aldactone to 50 mg   Continue  Lasix   Continue fluid and salt restriction.  Dorris Carnes MD

## 2020-06-26 NOTE — Progress Notes (Signed)
PROGRESS NOTE    [name]  ZLD:[id number] DOB: 1961-03-17 DOA: 06/20/2020 PCP: [name], MD   Brief Narrative:  As per H&P written by Dr. Waldron Labs on 06/20/2020 58 y.o.male,past medical history of chronic diastolic CHF, who does report worsening overload status, was recently seen by cardiology in June, where they have increased his Lasix to 80 mg oral daily, despite that he continues to have worsening volume overload, and increased weight gain, worsening lower extremity edema up to the thigh, scrotal edema, increased abdominal girth as well, does report some exertional dyspnea as well, he does report some cough and congestion recently started on p.o. antibiotics by PCP which he did not start taking it, he denies any chest pain, but he does report dyspnea, mainly exertional, he was seen today by cardiology for a follow-up, where his creatinine was noted to have increased to 2.08, he was significantly volume overloaded, and referred to the hospital for admission and IV diuresis.  -Patient still with significant volume overload requiring IV Lasix 80 mg twice daily.  Will likely require several more days of ongoing IV diuresis.  Assessment & Plan:   Active Problems:   Diabetes mellitus type 2, uncontrolled (HCC)   Hypertension   CAD (coronary artery disease)   OSA on CPAP   Anemia   Liver failure (HCC)   Cirrhosis of liver (HCC)   Acute on chronic heart failure with preserved ejection fraction (HCC)   Anasarca   1-fluid overload/anasarca: Multifactorial in the setting of cirrhosis and acute on chronic diastolic heart failure -Patient encouraged to follow low-sodium diet; fluid restriction to 1800 mL requested. -Continue IV diuresis with Lasix (increase to 80 mg every 12 hours) and continue the use of Aldactone 25 mg twice a day. May increase to 50 mg if tolerated. -Continue to follow daily weight and history does not note -Continue IV albumin. -Cardiology service and  gastroenterology service on board; appreciate recommendations and assistance. -Continue to follow electrolytes and renal function closely. -weight down to 298 pounds with goal around 261 pounds  2-Diabetes mellitus type 2, with nephropathy and transient uncontrolled hyperglycemia-improved -CBG in the 240's range on presentation. -Continue long-acting insulin and sliding scale insulin -Follow CBGs and further adjust hypoglycemic regimen as required. -A1C 6.8  3-acute on chronic stage IIIa chronic kidney disease -In the setting of heart failure exacerbation and concern for hepatorenal syndrome -Continue diuresis and the use of albumin -Closely for electrolytes and renal function. -Minimize the use of nephrotoxic agents. -Creatinine down to 1.65  4-CAD (coronary artery disease) -No chest pain currently. -Continue the use of beta-blocker, aspirin and statins. -Telemetry monitoring in place.  5-OSA on CPAP -Continue CPAP nightly.  6-pancytopenia -In the setting of cirrhosis and anemia of chronic kidney disease -Continue patient follow-up with Dr. Delton Coombes for pancytopenia purposes and further work-up -Continue GI follow-up and referral to hepatology clinic in [place]. -No signs of overt bleeding -Continue the use of nadolol and PPI.   7-NASH cirrhosis -Guarded prognosis as per GI evaluation -Plan is to pursued outpatient follow-up with [facility] hepatology clinic -Because of his cirrhosis is NASH. -Ammonia has been added -Continue Lasix, Aldactone, nadolol and PPI -per GI rec's started on low dose lactulose -IV vitamin K x10 mg given 7/14  8-morbid obesity -Body mass index is 43.16 kg/m. -low calorie diet and portion control discussed with patient   DVT prophylaxis:SCDs Code Status:Full code Family Communication:No family member at bedside. Disposition:  Status is: Inpatient  Dispo: The patient is from:  Home Anticipated d/c is to: To be  determined; hopefully home. Anticipated d/c date is: To be determined Patient currently no medically stable for discharge; still with significant fluid overload findings, complaining of shortness of breath with exertion and having orthopnea. Continue IV diuresis likely for several more days.  Consultants:  Cardiology service  GI service   Procedures: See below for x-ray reports  Antimicrobials:  None  Subjective: Patient seen and evaluated today with no new acute complaints or concerns. No acute concerns or events noted overnight.  He continues to have ongoing abdominal distention and edema.  Objective: Vitals:   06/25/20 2140 06/25/20 2340 06/26/20 0538 06/26/20 0600  BP: 125/67  (!) 119/56   Pulse: 65  64   Resp: 18  19   Temp: 98.2 F (36.8 C)  98 F (36.7 C)   TempSrc: Oral  Oral   SpO2: 100% 96% 96%   Weight:    (!) 136.3 kg  Height:        Intake/Output Summary (Last 24 hours) at 06/26/2020 1342 Last data filed at 06/26/2020 1200 Gross per 24 hour  Intake --  Output 2250 ml  Net -2250 ml   Filed Weights   06/24/20 0500 06/25/20 0600 06/26/20 0600  Weight: (!) 138.4 kg 135.3 kg (!) 136.3 kg    Examination:  General exam: Appears calm and comfortable  Respiratory system: Clear to auscultation. Respiratory effort normal. Cardiovascular system: S1 & S2 heard, RRR. No JVD, murmurs, rubs, gallops or clicks.  Gastrointestinal system: Abdomen is distended, soft and nontender. No organomegaly or masses felt. Normal bowel sounds heard. Central nervous system: Alert and oriented. No focal neurological deficits. Extremities: Bilateral 1-2+ edema noted Skin: No rashes, lesions or ulcers Psychiatry: Judgement and insight appear normal. Mood & affect appropriate.     Data Reviewed: I have personally reviewed following labs and imaging studies  CBC: Recent Labs  Lab 06/20/20 1800 06/21/20 0608 06/23/20 0958 06/25/20 0605  06/26/20 0555  WBC 3.6* 2.9* 4.1 3.4* 3.5*  NEUTROABS  --   --  2.8 2.0  --   HGB 9.8* 9.4* 9.4* 8.5* 9.0*  HCT 31.0* 30.2* 29.7* 26.7* 27.7*  MCV 109.5* 110.2* 110.8* 108.1* 106.9*  PLT 63* 58* 54* 49* 55*   Basic Metabolic Panel: Recent Labs  Lab 06/22/20 0620 06/23/20 0613 06/24/20 0558 06/25/20 0605 06/26/20 0555  NA 143 142 140 139 139  K 4.5 4.2 4.1 3.8 3.8  CL 111 110 107 104 105  CO2 24 24 23 26 24   GLUCOSE 118* 102* 76 110* 91  BUN 54* 58* 57* 55* 53*  CREATININE 1.80* 1.82* 1.75* 1.76* 1.65*  CALCIUM 9.2 9.3 9.2 9.3 9.0  MG  --   --   --   --  1.9   GFR: Estimated Creatinine Clearance: 66.7 mL/min (A) (by C-G formula based on SCr of 1.65 mg/dL (H)). Liver Function Tests: Recent Labs  Lab 06/20/20 1800 06/21/20 0608 06/23/20 0958 06/25/20 0605 06/26/20 0555  AST 27 26 21 17 18   ALT 11 10 9 9 8   ALKPHOS 45 42 34* 32* 35*  BILITOT 1.0 1.1 2.0* 1.9* 1.8*  PROT 7.1 6.7 7.3 7.0 7.0  ALBUMIN 3.6 3.3* 4.6 4.7 4.5   No results for input(s): LIPASE, AMYLASE in the last 168 hours. Recent Labs  Lab 06/20/20 1800  AMMONIA 68*   Coagulation Profile: Recent Labs  Lab 06/20/20 1800 06/23/20 0958 06/25/20 0605 06/26/20 0555  INR 1.4* 1.5* 1.7* 1.6*  Cardiac Enzymes: No results for input(s): CKTOTAL, CKMB, CKMBINDEX, TROPONINI in the last 168 hours. BNP (last 3 results) No results for input(s): PROBNP in the last 8760 hours. HbA1C: No results for input(s): HGBA1C in the last 72 hours. CBG: Recent Labs  Lab 06/25/20 1146 06/25/20 1655 06/25/20 2138 06/26/20 0738 06/26/20 1051  GLUCAP 123* 147* 158* 85 168*   Lipid Profile: No results for input(s): CHOL, HDL, LDLCALC, TRIG, CHOLHDL, LDLDIRECT in the last 72 hours. Thyroid Function Tests: No results for input(s): TSH, T4TOTAL, FREET4, T3FREE, THYROIDAB in the last 72 hours. Anemia Panel: No results for input(s): VITAMINB12, FOLATE, FERRITIN, TIBC, IRON, RETICCTPCT in the last 72 hours. Sepsis  Labs: No results for input(s): PROCALCITON, LATICACIDVEN in the last 168 hours.  Recent Results (from the past 240 hour(s))  SARS Coronavirus 2 by RT PCR (hospital order, performed in [facility] hospital lab) Nasopharyngeal Nasopharyngeal Swab     Status: None   Collection Time: 06/20/20  5:30 PM   Specimen: Nasopharyngeal Swab  Result Value Ref Range Status   SARS Coronavirus 2 NEGATIVE NEGATIVE Final    Comment: (NOTE) SARS-CoV-2 target nucleic acids are NOT DETECTED.  The SARS-CoV-2 RNA is generally detectable in upper and lower respiratory specimens during the acute phase of infection. The lowest concentration of SARS-CoV-2 viral copies this assay can detect is 250 copies / mL. A negative result does not preclude SARS-CoV-2 infection and should not be used as the sole basis for treatment or other patient management decisions.  A negative result may occur with improper specimen collection / handling, submission of specimen other than nasopharyngeal swab, presence of viral mutation(s) within the areas targeted by this assay, and inadequate number of viral copies (<250 copies / mL). A negative result must be combined with clinical observations, patient history, and epidemiological information.  Fact Sheet for Patients:   StrictlyIdeas.no  Fact Sheet for Healthcare Providers: BankingDealers.co.za  This test is not yet approved or  cleared by the [place] FDA and has been authorized for detection and/or diagnosis of SARS-CoV-2 by FDA under an Emergency Use Authorization (EUA).  This EUA will remain in effect (meaning this test can be used) for the duration of the COVID-19 declaration under Section 564(b)(1) of the Act, 21 U.S.C. section 360bbb-3(b)(1), unless the authorization is terminated or revoked sooner.  Performed at [facility], [address], [place], [place] [address]          Radiology Studies: No results  found.      Scheduled Meds: . aspirin EC  81 mg Oral QHS  . cholecalciferol  5,000 Units Oral q AM  . ferrous sulfate  325 mg Oral q AM  . furosemide  80 mg Intravenous BID  . insulin aspart  0-15 Units Subcutaneous TID WC  . insulin aspart  0-5 Units Subcutaneous QHS  . insulin detemir  15 Units Subcutaneous QHS  . insulin detemir  30 Units Subcutaneous Daily  . [START ON 06/27/2020] lactulose  20 g Oral Daily  . loratadine  10 mg Oral q AM  . nadolol  20 mg Oral q AM  . pantoprazole  40 mg Oral Daily  . pravastatin  20 mg Oral QHS  . spironolactone  50 mg Oral BID    LOS: 6 days    Time spent: 30 minutes    Karine Garn Darleen Crocker, DO [facility]  If 7PM-7AM, please contact night-coverage [email or web address] 06/26/2020, 1:42 PM

## 2020-06-26 NOTE — Progress Notes (Signed)
Had three bms today.  Walking better after max assist to stand up

## 2020-06-26 NOTE — Progress Notes (Signed)
Physical Therapy Treatment Patient Details Name: [name] MRN: [id number] DOB: [birth year] Today's Date: 06/26/2020    History of Present Illness [name]  is a 59 y.o. male, past medical history of chronic diastolic CHF, who does report worsening overload status, was recently seen by cardiology in June, where they have increased his Lasix to 80 mg oral daily, despite that he continues to have worsening volume overload, and increased weight gain, worsening lower extremity edema up to the thigh, scrotal edema, increased abdominal girth as well, does report some exertional dyspnea as well, he does report some cough and congestion recently started on p.o. antibiotics by PCP which he did not start taking it, he denies any chest pain, but he does report dyspnea, mainly exertional, he was seen today by cardiology for a follow-up, where his creatinine was noted to have increased to 2.08, he was significantly volume overloaded, where he gained 60 pounds since April, as well patient with abdominal ultrasound this February which suggesting cirrhosis, slightly nodular contour, as well he was noted to have thrombocytopenia, will you is following with oncology Dr. Delton [name] regarding that, direct admission was requested by cardiology for IV diuresis.    PT Comments    Patient demonstrates increased endurance/distance for gait training with slow slightly labored cadence, limited mostly due to fatigue, able to transfer to commode in bathroom to have a bowel movement, required Min guard assist to stand up from commode and tolerated staying up in chair after therapy.  Patient will benefit from continued physical therapy in hospital and recommended venue below to increase strength, balance, endurance for safe ADLs and gait.   Follow Up Recommendations  Home health PT;Supervision for mobility/OOB;Supervision - Intermittent     Equipment Recommendations  None recommended by PT    Recommendations  for Other Services       Precautions / Restrictions Precautions Precautions: Fall Restrictions Weight Bearing Restrictions: No    Mobility  Bed Mobility               General bed mobility comments: Patient present seated in chair (assisted by nursing staff)  Transfers Overall transfer level: Needs assistance Equipment used: Rolling walker (2 wheeled) Transfers: Sit to/from [facility] Sit to Stand: Min guard Stand pivot transfers: Supervision;Min guard       General transfer comment: increased time, labored  movement, requires verbal cues for proper hand placement during sit to stands with fair/good carryover  Ambulation/Gait Ambulation/Gait assistance: Supervision Gait Distance (Feet): 70 Feet Assistive device: Rolling walker (2 wheeled) Gait Pattern/deviations: Decreased step length - right;Decreased step length - left;Decreased stride length Gait velocity: decreased   General Gait Details: slightly increased endurance/distance for ambulation with slow labored cadence without loss of balance, limited secondary to c/o fatigue   Stairs             Wheelchair Mobility    Modified Rankin (Stroke Patients Only)       Balance Overall balance assessment: Needs assistance Sitting-balance support: Feet supported;No upper extremity supported Sitting balance-Leahy Scale: Good Sitting balance - Comments: seated in chair   Standing balance support: During functional activity;Bilateral upper extremity supported Standing balance-Leahy Scale: Fair Standing balance comment: using RW                            Cognition Arousal/Alertness: Awake/alert Behavior During Therapy: WFL for tasks assessed/performed Overall Cognitive Status: Within Functional Limits for tasks assessed  Exercises      General Comments        Pertinent Vitals/Pain Pain Assessment: No/denies pain     Home Living                      Prior Function            PT Goals (current goals can now be found in the care plan section) Acute Rehab PT Goals Patient Stated Goal: return home with family to assist PT Goal Formulation: With patient Time For Goal Achievement: 06/27/20 Potential to Achieve Goals: Good Progress towards PT goals: Progressing toward goals    Frequency    Min 3X/week      PT Plan      Co-evaluation              AM-PAC PT "6 Clicks" Mobility   Outcome Measure  Help needed turning from your back to your side while in a flat bed without using bedrails?: A Little Help needed moving from lying on your back to sitting on the side of a flat bed without using bedrails?: A Little Help needed moving to and from a bed to a chair (including a wheelchair)?: A Little Help needed standing up from a chair using your arms (e.g., wheelchair or bedside chair)?: A Little Help needed to walk in hospital room?: A Little Help needed climbing 3-5 steps with a railing? : A Little 6 Click Score: 18    End of Session   Activity Tolerance: Patient tolerated treatment well;Patient limited by fatigue Patient left: in chair;with call bell/phone within reach Nurse Communication: Mobility status PT Visit Diagnosis: Other abnormalities of gait and mobility (R26.89);Unsteadiness on feet (R26.81);Muscle weakness (generalized) (M62.81)     Time: 0920-0950 PT Time Calculation (min) (ACUTE ONLY): 30 min  Charges:  $Gait Training: 8-22 mins $Therapeutic Activity: 8-22 mins                     12:06 PM, 06/26/20 Lonell Grandchild, MPT Physical Therapist with [facility] [phone] office [phone] mobile phone

## 2020-06-26 NOTE — Progress Notes (Signed)
Subjective:  Doing okay. No abdominal pain. Tolerating diet. No n/v. Issues with Purwick, bed pad and sheet soaked yesterday, some urinary incontinence while ambulated to beside commode.   Objective: Vital signs in last 24 hours: Temp:  [98 F (36.7 C)-98.4 F (36.9 C)] 98 F (36.7 C) (07/15 0538) Pulse Rate:  [64-65] 64 (07/15 0538) Resp:  [16-19] 19 (07/15 0538) BP: (108-125)/(56-67) 119/56 (07/15 0538) SpO2:  [96 %-100 %] 96 % (07/15 0538) Weight:  [136.3 kg] 136.3 kg (07/15 0600) Last BM Date: 06/25/20 General:   Alert,  Well-developed, well-nourished, pleasant and cooperative in NAD Head:  Normocephalic and atraumatic. Eyes:  Sclera clear, no icterus.  Abdomen:  Soft, obese, nontender. Some dependent edema in the flanks, right side greater than left. Normal bowel sounds, without guarding, and without rebound.   Extremities:  Without clubbing, deformity. Edema mostly in pedal area 2+. SCDs in place. No edema above the knees. Neurologic:  Alert and  oriented x4;  grossly normal neurologically. Skin:  Intact without significant lesions or rashes. Psych:  Alert and cooperative. Normal mood and affect.  Intake/Output from previous day: 07/14 0701 - 07/15 0700 In: -  Out: 1800 [Urine:1800] Intake/Output this shift: No intake/output data recorded.  Lab Results: CBC Recent Labs    06/23/20 0958 06/25/20 0605 06/26/20 0555  WBC 4.1 3.4* 3.5*  HGB 9.4* 8.5* 9.0*  HCT 29.7* 26.7* 27.7*  MCV 110.8* 108.1* 106.9*  PLT 54* 49* 55*   BMET Recent Labs    06/24/20 0558 06/25/20 0605 06/26/20 0555  NA 140 139 139  K 4.1 3.8 3.8  CL 107 104 105  CO2 23 26 24   GLUCOSE 76 110* 91  BUN 57* 55* 53*  CREATININE 1.75* 1.76* 1.65*  CALCIUM 9.2 9.3 9.0   LFTs Recent Labs    06/23/20 0958 06/25/20 0605 06/26/20 0555  BILITOT 2.0* 1.9* 1.8*  BILIDIR 0.3* 0.4*  --   IBILI 1.7* 1.5*  --   ALKPHOS 34* 32* 35*  AST 21 17 18   ALT 9 9 8   PROT 7.3 7.0 7.0  ALBUMIN 4.6 4.7 4.5    No results for input(s): LIPASE in the last 72 hours. PT/INR Recent Labs    06/23/20 0958 06/25/20 0605 06/26/20 0555  LABPROT 17.9* 19.4* 18.5*  INR 1.5* 1.7* 1.6*      Imaging Studies: US Abdomen Complete  Result Date: 06/21/2020 CLINICAL DATA:  Morbid obesity, hepatic cirrhosis EXAM: ABDOMEN ULTRASOUND COMPLETE COMPARISON:  Prior abdominal ultrasound 01/15/2020 FINDINGS: Gallbladder: Multiple mobile echogenic foci within the gallbladder lumen consistent with cholelithiasis. The gallbladder wall is thickened at 4.6 mm. Per the sonographer, the sonographic Percell Miller sign was negative. Common bile duct: Diameter: Normal at 4 mm. Liver: Nodular hepatic contour. The hepatic parenchyma is diffusely heterogeneous with coarsening of the echotexture. No discrete hepatic lesion is identified. Small volume perihepatic ascites. Portal vein is patent on color Doppler imaging with normal direction of blood flow towards the liver. IVC: No abnormality visualized. Pancreas: Visualized portion unremarkable. Spleen: Splenomegaly. The spleen measures approximately 768 cubic cm in volume. There is small volume perisplenic ascites as well. Right Kidney: Length: 10.5 cm. Echogenicity within normal limits. No mass or hydronephrosis visualized. Left Kidney: Length: 11.7 cm. Echogenicity within normal limits. No mass or hydronephrosis visualized. Abdominal aorta: No evidence of aneurysm. Other findings: None. IMPRESSION: 1. Hepatic cirrhosis with evidence of portal hypertension (splenomegaly and mild ascites). 2. No discrete hepatic lesion identified. 3. Cholelithiasis. 4. Gallbladder wall thickening is nonspecific  in the setting of cirrhosis and can be related to the underlying liver pathology and/or hypoalbuminemia. Acute cholecystitis is not suspected. Electronically Signed   By: Jacqulynn Cadet M.D.   On: 06/21/2020 13:43   DG Chest Port 1 View  Result Date: 06/20/2020 CLINICAL DATA:  History of CHF with reported  fluid overload EXAM: PORTABLE CHEST 1 VIEW COMPARISON:  Radiograph 02/14/2020, 02/01/2020 FINDINGS: Patient rotated in a left anterior obliquity. Postsurgical changes from prior sternotomy and CABG. Slight prominence of the cardiomediastinal silhouette compared to prior is likely related to projectional differences and portable technique. Calcified aorta. No focal consolidative opacity. Central vascular congestion but without convincing features of frank edema. No pneumothorax or visible effusion. No acute osseous or soft tissue abnormality. Degenerative changes are present in the imaged spine and shoulders. IMPRESSION: Central vascular congestion without frank edema or effusion at this time. Electronically Signed   By: Lovena Le M.D.   On: 06/20/2020 20:04  [2 weeks]   Assessment:  59 y/o male with cirrhosis due to NASH/ASH, multiple comorbidities, admitted with decompensation/anasarca. Cardiology following due to concern for heart failure. Known h/o IDA with worsening anemia from baseline but no overt GI bleeding.   Fluid overload: multifactorial in setting of acute on chronic CHF and cirrhosis. U/S 7/10 with small volume ascites. Diuresis has been complicated by CKD. Cardiology has been managing diuretics. Plans to hold off aldactone today due to soft BPs in setting of high dose lasix. Creatinine continues to improve. I/Os inaccurate last 24 hours due to missed urine measurements in setting of incontinence and Purwick issues. Down about 20 pounds this admission.   Cirrhosis: mild encephalopathy on admission improved with low dose lactulose. Also with thrombocytopenia, known grade 2-3 esophageal varices and portal gastropathy on EGD in December 2020, currently on bleeding prophylaxis with nadolol 20 mg daily.  Colonoscopy in December 2020 with polyps, findings suggestive of portal colopathy, and rectal varices. History of anemia that is multifactorial with decline in baseline, no overt GI bleeding,  Hgb 9 today stable. MELD Na 19 today.   Anemia: likely multifactorial. No overt GI bleeding. No need for inpatient endoscopy at this time. He will need high quality colonoscopy in the future at [facility] as prior attempts locally have not been complete due to poor prep.     Gallbladder polyp:History of 9 mm gallbladder polyp noted on US abdomen in Feb 2021 but not mentioned on this [place] inpatient. Previously seen by [facility] who recommended referral to [facility].    Plan: 1. Continue with diuretics per cardiology recommendations.  2. Continue to monitor diuresis.  3. Titrate lactulose for 2-3 BMs daily. 4. Reassess in am.  Laureen Ochs. Bernarda Caffey [facility] [phone] 7/15/202111:57 AM     LOS: 6 days

## 2020-06-27 ENCOUNTER — Ambulatory Visit: Payer: Federal, State, Local not specified - PPO | Admitting: Student

## 2020-06-27 DIAGNOSIS — N1831 Chronic kidney disease, stage 3a: Secondary | ICD-10-CM | POA: Diagnosis not present

## 2020-06-27 DIAGNOSIS — I10 Essential (primary) hypertension: Secondary | ICD-10-CM | POA: Diagnosis not present

## 2020-06-27 DIAGNOSIS — N179 Acute kidney failure, unspecified: Secondary | ICD-10-CM | POA: Diagnosis not present

## 2020-06-27 DIAGNOSIS — D631 Anemia in chronic kidney disease: Secondary | ICD-10-CM | POA: Diagnosis not present

## 2020-06-27 DIAGNOSIS — R601 Generalized edema: Secondary | ICD-10-CM | POA: Diagnosis not present

## 2020-06-27 DIAGNOSIS — N183 Chronic kidney disease, stage 3 unspecified: Secondary | ICD-10-CM

## 2020-06-27 DIAGNOSIS — I5033 Acute on chronic diastolic (congestive) heart failure: Secondary | ICD-10-CM | POA: Diagnosis not present

## 2020-06-27 DIAGNOSIS — K7460 Unspecified cirrhosis of liver: Secondary | ICD-10-CM | POA: Diagnosis not present

## 2020-06-27 DIAGNOSIS — D649 Anemia, unspecified: Secondary | ICD-10-CM | POA: Diagnosis not present

## 2020-06-27 DIAGNOSIS — I350 Nonrheumatic aortic (valve) stenosis: Secondary | ICD-10-CM

## 2020-06-27 DIAGNOSIS — Z9989 Dependence on other enabling machines and devices: Secondary | ICD-10-CM | POA: Diagnosis not present

## 2020-06-27 DIAGNOSIS — K7469 Other cirrhosis of liver: Secondary | ICD-10-CM | POA: Diagnosis not present

## 2020-06-27 DIAGNOSIS — G4733 Obstructive sleep apnea (adult) (pediatric): Secondary | ICD-10-CM | POA: Diagnosis not present

## 2020-06-27 DIAGNOSIS — D61818 Other pancytopenia: Secondary | ICD-10-CM | POA: Diagnosis not present

## 2020-06-27 DIAGNOSIS — E1165 Type 2 diabetes mellitus with hyperglycemia: Secondary | ICD-10-CM | POA: Diagnosis not present

## 2020-06-27 LAB — COMPREHENSIVE METABOLIC PANEL
ALT: 9 U/L (ref 0–44)
AST: 21 U/L (ref 15–41)
Albumin: 4.3 g/dL (ref 3.5–5.0)
Alkaline Phosphatase: 36 U/L — ABNORMAL LOW (ref 38–126)
BUN: 47 mg/dL — ABNORMAL HIGH (ref 6–20)
CO2: 28 mmol/L (ref 22–32)
Calcium: 10.1 mg/dL (ref 8.9–10.3)
Chloride: 95 mmol/L — ABNORMAL LOW (ref 98–111)
Creatinine, Ser: 1.59 mg/dL — ABNORMAL HIGH (ref 0.61–1.24)
GFR calc Af Amer: 54 mL/min — ABNORMAL LOW (ref >60)
GFR calc non Af Amer: 46 mL/min — ABNORMAL LOW (ref >60)
Glucose, Bld: 105 mg/dL — ABNORMAL HIGH (ref 70–99)
Potassium: 3.6 mmol/L (ref 3.5–5.1)
Sodium: 145 mmol/L (ref 135–145)
Total Bilirubin: 1.8 mg/dL — ABNORMAL HIGH (ref 0.3–1.2)
Total Protein: 6.8 g/dL (ref 6.5–8.1)

## 2020-06-27 LAB — GLUCOSE, CAPILLARY
Glucose-Capillary: 123 mg/dL — ABNORMAL HIGH (ref 70–99)
Glucose-Capillary: 226 mg/dL — ABNORMAL HIGH (ref 70–99)
Glucose-Capillary: 154 mg/dL — ABNORMAL HIGH (ref 70–99)
Glucose-Capillary: 164 mg/dL — ABNORMAL HIGH (ref 70–99)

## 2020-06-27 LAB — CBC
HCT: 28.8 % — ABNORMAL LOW (ref 39.0–52.0)
Hemoglobin: 9.5 g/dL — ABNORMAL LOW (ref 13.0–17.0)
MCH: 34.8 pg — ABNORMAL HIGH (ref 26.0–34.0)
MCHC: 33 g/dL (ref 30.0–36.0)
MCV: 105.5 fL — ABNORMAL HIGH (ref 80.0–100.0)
Platelets: 57 10*3/uL — ABNORMAL LOW (ref 150–400)
RBC: 2.73 MIL/uL — ABNORMAL LOW (ref 4.22–5.81)
RDW: 14.2 % (ref 11.5–15.5)
WBC: 3.2 10*3/uL — ABNORMAL LOW (ref 4.0–10.5)
nRBC: 0 % (ref 0.0–0.2)

## 2020-06-27 MED ORDER — POLYVINYL ALCOHOL 1.4 % OP SOLN
2.0000 [drp] | OPHTHALMIC | Status: DC | PRN
  Filled 2020-06-27: qty 15

## 2020-06-27 MED ORDER — SPIRONOLACTONE 25 MG PO TABS: 100.0000 mg | ORAL_TABLET | Freq: Two times a day (BID) | ORAL | Status: DC

## 2020-06-27 MED ORDER — SPIRONOLACTONE 25 MG PO TABS
100.0000 mg | ORAL_TABLET | Freq: Every day | ORAL | Status: DC
  Administered 2020-06-28 – 2020-07-01 (×4): 100 mg via ORAL
  Filled 2020-06-27 (×4): qty 4

## 2020-06-27 MED ORDER — POTASSIUM CHLORIDE CRYS ER 20 MEQ PO TBCR
20.0000 meq | EXTENDED_RELEASE_TABLET | Freq: Once | ORAL | Status: DC
  Filled 2020-06-27: qty 1

## 2020-06-27 MED ORDER — SPIRONOLACTONE 25 MG PO TABS
50.0000 mg | ORAL_TABLET | Freq: Once | ORAL | Status: AC
  Administered 2020-06-27: 50 mg via ORAL
  Filled 2020-06-27: qty 2

## 2020-06-27 NOTE — Progress Notes (Addendum)
Progress Note  Patient Name: [name] Date of Encounter: 06/27/2020  Primary Cardiologist: Rozann Lesches, MD   Subjective   Not pleased with having lab sticks every morning but is aware of why this is being obtained. Breathing at baseline. No chest pain or palpitations.   Inpatient Medications    Scheduled Meds: . aspirin EC  81 mg Oral QHS  . cholecalciferol  5,000 Units Oral q AM  . ferrous sulfate  325 mg Oral q AM  . furosemide  80 mg Intravenous BID  . insulin aspart  0-15 Units Subcutaneous TID WC  . insulin aspart  0-5 Units Subcutaneous QHS  . insulin detemir  15 Units Subcutaneous QHS  . insulin detemir  30 Units Subcutaneous Daily  . lactulose  20 g Oral Daily  . loratadine  10 mg Oral q AM  . nadolol  20 mg Oral q AM  . pantoprazole  40 mg Oral Daily  . pravastatin  20 mg Oral QHS  . spironolactone  50 mg Oral BID   Continuous Infusions:  PRN Meds: albuterol, traMADol   Vital Signs    Vitals:   06/26/20 2026 06/27/20 0011 06/27/20 0310 06/27/20 0425  BP: 121/64   103/62  Pulse: 62   62  Resp: 16   16  Temp: 98.6 F (37 C)   97.7 F (36.5 C)  TempSrc: Oral   Oral  SpO2: 99% 97%  95%  Weight:   133.7 kg   Height:        Intake/Output Summary (Last 24 hours) at 06/27/2020 0824 Last data filed at 06/26/2020 1200 Gross per 24 hour  Intake --  Output 250 ml  Net -250 ml    Last 3 Weights 06/27/2020 06/26/2020 06/25/2020  Weight (lbs) 294 lb 12.1 oz 300 lb 7.8 oz 298 lb 4.5 oz  Weight (kg) 133.7 kg 136.3 kg 135.3 kg      Telemetry    NSR, HR in 60's with occasional PVC's. Strip labeled as "Vtach" appears most consistent with sinus rhythm with artifact. No NSVT noted. - Personally Reviewed  ECG    No new tracings.   Physical Exam   General: Well developed, well nourished, male appearing in no acute distress. Head: Normocephalic, atraumatic.  Neck: Supple without bruits, JVD not elevated. Lungs:  Resp regular and unlabored, CTA  without wheezing or rales. Heart: RRR, S1, S2, no S3, S4, or murmur; no rub. Abdomen: Soft, non-tender, non-distended with normoactive bowel sounds. No hepatomegaly. No rebound/guarding. No obvious abdominal masses. Extremities: No clubbing or cyanosis, 1+ pitting edema bilaterally (improving). Distal pedal pulses are 2+ bilaterally. Neuro: Alert and oriented X 3. Moves all extremities spontaneously. Psych: Normal affect.  Labs    Chemistry Recent Labs  Lab 06/23/20 [phone] 06/23/20 0958 06/24/20 0558 06/25/20 0605 06/26/20 0555  NA   < >  --  140 139 139  K   < >  --  4.1 3.8 3.8  CL   < >  --  107 104 105  CO2   < >  --  23 26 24   GLUCOSE   < >  --  76 110* 91  BUN   < >  --  57* 55* 53*  CREATININE   < >  --  1.75* 1.76* 1.65*  CALCIUM   < >  --  9.2 9.3 9.0  PROT  --  7.3  --  7.0 7.0  ALBUMIN  --  4.6  --  4.7 4.5  AST  --  21  --  17 18  ALT  --  9  --  9 8  ALKPHOS  --  34*  --  32* 35*  BILITOT  --  2.0*  --  1.9* 1.8*  GFRNONAA   < >  --  41* 41* 44*  GFRAA   < >  --  48* 48* 52*  ANIONGAP   < >  --  10 9 10    < > = values in this interval not displayed.     Hematology Recent Labs  Lab 06/23/20 0958 06/25/20 0605 06/26/20 0555  WBC 4.1 3.4* 3.5*  RBC 2.68* 2.47* 2.59*  HGB 9.4* 8.5* 9.0*  HCT 29.7* 26.7* 27.7*  MCV 110.8* 108.1* 106.9*  MCH 35.1* 34.4* 34.7*  MCHC 31.6 31.8 32.5  RDW 14.5 13.9 13.9  PLT 54* 49* 55*    Cardiac EnzymesNo results for input(s): TROPONINI in the last 168 hours. No results for input(s): TROPIPOC in the last 168 hours.   BNP Recent Labs  Lab 06/20/20 1800  BNP 601.0*     DDimer No results for input(s): DDIMER in the last 168 hours.   Radiology    No results found.  Cardiac Studies   Echocardiogram: 05/2020 IMPRESSIONS    1. Left ventricular ejection fraction, by estimation, is 60 to 65%. The  left ventricle has normal function. The left ventricle has no regional  wall motion abnormalities. The left  ventricular internal cavity size was  mildly dilated. There is mild left  ventricular hypertrophy. Left ventricular diastolic parameters are  indeterminate.  2. Right ventricular systolic function is normal. The right ventricular  size is normal. There is normal pulmonary artery systolic pressure.  3. The mitral valve is abnormal. Mild mitral valve regurgitation.  4. AV is thickened, calcifieid Difficult to see well. Peak and mean  gradients through the valve are 31 and 18 mm Hg respectively consistent  with mild AS. Compared to echo report from 2019, gradients are increased.  . The aortic valve is abnormal. Aortic  valve regurgitation is not visualized. Mild aortic valve stenosis.  5. The inferior vena cava is dilated in size with <50% respiratory  variability, suggesting right atrial pressure of 15 mmHg.   Patient Profile     59 y.o. male w/ PMH ofCAD (s/p CABG in 2016), HTN, HLD, Type 2 DM, Stage 3 CKD, OSA, thrombocytopenia and polyclonal gammopathy who was admitted directly from the office on 06/20/2020 for an acute CHF exacerbation as weight had increased by 60 lbs since 03/2020.  Assessment & Plan    1. Acute on Chronic Diastolic CHF Exacerbation -At the time of his office visit, weight was at 319 lbs and was previously 260 lbs in 03/2020. IV Lasix has been titrated this admission and he is now on IV Lasix 80mg  BID. I&O's are not accurate as he has scrotal edema and difficulty with PurWick placement. Weight has declined to 294 lbs and I will ask for a standing weight to be obtained today for improved accuracy. Creatinine stable at 1.59. K+ low-normal at 3.6 and will order K+ supplementation with his AM Lasix. Continue with high-dose IV diuresis as he will likely require several more days of this given his volume overload.    2. CAD - s/p CABG in 2016 and he denies any recent anginal symptoms. Recent echo in 05/2020 showed a preserved EF with no regional WMA. Continue ASA, BB and  statin therapy.  3. Aortic Stenosis -His AS was mild by most recent echo last month. Will continue to follow periodically as an outpatient.   4. Liver Cirrhosis/NASH - Abdominal US has shown cirrhosis with portal HTN and ascites. Remains on Nadolol and Spironolactone. Referred to the Hepatology Clinic at [facility] for further evaluation. - Spironolactone was further titrated to 50mg  BID yesterday and BP appears to be tolerating this well. Creatinine stable at 1.59.  5. Stage 3 CKD - Baseline creatinine close to 1.3 - 1.4. Creatinine was elevated at 2.08 on admission and continues to improve with diuresis, at 1.59 this AM.   6. Weakness -He has been evaluated by PT this admission with Home Health recommended.  7. Pancytopenia - CBC for this AM is pending but CBC from 7/15 showed WBC 3.5, Hgb 9.0 and platelets 55K. He is followed by Hematology here at [facility] as an outpatient.   For questions or updates, please contact [place] Please consult [email or web address] for contact info under Cardiology/STEMI.   Signed, Erma Heritage , PA-C 8:24 AM 06/27/2020 Pager: [phone]  Patient seen and examined   I agree with findings as noted above by B Strader Pt is clinically improved from yesterday Currently sitting in chair  Pudde of urine at feet (Leak at wick seal)  On exam: LUngs are clear Cardiac RRR  No S3    Abd remains distended Ext with 1+ edema   Legs a little softer   Would continue diuresis   After today I would switch spironolactone to 100 mg daily.  May get better response with one higher dose   Continue lasix IV now    Platelets low   49 to 57 K    On ASA   Hgb has been rel stable   At some pt if drops may need to drop ASA  Importantly   Pt needs review of diet   He has 2 bags of potato chips (450 mg Na) on table as well as a muffin that had clsoe to 300     Discussed this with him   Will switch diet to low Na and have dietary review this with him (and hopefully  wife).  Dorris Carnes

## 2020-06-27 NOTE — Progress Notes (Addendum)
Subjective: Feels like he's doing better. Denies abdominal pain, N/V. Has had a couple stools in the last day, loose and denies hematochezia; dark but not black. Oriented x 3. His left eye feels irritated, no severe pain or vision changes. Tolerating diet well. No other overt GI complaints.  Objective: Vital signs in last 24 hours: Temp:  [97.5 F (36.4 C)-98.6 F (37 C)] 97.7 F (36.5 C) (07/16 0425) Pulse Rate:  [62] 62 (07/16 0425) Resp:  [16-18] 16 (07/16 0425) BP: (103-121)/(62-64) 103/62 (07/16 0425) SpO2:  [95 %-99 %] 95 % (07/16 0425) Weight:  [133.7 kg] 133.7 kg (07/16 0310) Last BM Date: 06/26/20 General:   Alert and oriented, pleasant Head:  Normocephalic and atraumatic. Eyes:  Left eye appears mildly injected compared to the right.  Neck:  Supple, without thyromegaly or masses.  Heart:  S1, S2 present, no murmurs noted.  Lungs: Clear to auscultation bilaterally, without wheezing, rales, or rhonchi.  Abdomen:  Bowel sounds present, rounded and mildly firm but no obvious ascites. Abdomen non-tender. No HSM or hernias noted. No rebound or guarding. No masses appreciated  Msk:  Symmetrical without gross deformities. Pulses:  Normal pulses noted. Extremities:  Without clubbing. Persistent though improved LE edema. Neurologic:  Alert and  oriented x4;  grossly normal neurologically. Psych:  Alert and cooperative. Normal mood and affect.  Intake/Output from previous day: 07/15 0701 - 07/16 0700 In: -  Out: 250 [Urine:250] Intake/Output this shift: No intake/output data recorded.  Lab Results: Recent Labs    06/25/20 0605 06/26/20 0555  WBC 3.4* 3.5*  HGB 8.5* 9.0*  HCT 26.7* 27.7*  PLT 49* 55*   BMET Recent Labs    06/25/20 0605 06/26/20 0555  NA 139 139  K 3.8 3.8  CL 104 105  CO2 26 24  GLUCOSE 110* 91  BUN 55* 53*  CREATININE 1.76* 1.65*  CALCIUM 9.3 9.0   LFT Recent Labs    06/25/20 0605 06/26/20 0555  PROT 7.0 7.0  ALBUMIN 4.7 4.5  AST  17 18  ALT 9 8  ALKPHOS 32* 35*  BILITOT 1.9* 1.8*  BILIDIR 0.4*  --   IBILI 1.5*  --    PT/INR Recent Labs    06/25/20 0605 06/26/20 0555  LABPROT 19.4* 18.5*  INR 1.7* 1.6*   Hepatitis Panel No results for input(s): HEPBSAG, HCVAB, HEPAIGM, HEPBIGM in the last 72 hours.   Studies/Results: No results found.  Assessment: 59 y/o male with cirrhosis due to NASH/ASH, multiple comorbidities, admitted with decompensation/anasarca. Cardiology following due to concern for heart failure. Known h/o IDA with worsening anemia from baseline but no overt GI bleeding.    Fluid overload- multifactorial in setting of acute on chronic CHF and cirrhosis. Abdominal U/S 7/10 with small volume ascites. Diuresis has been complicated by CKD. Cardiology has been managing diuretics due to CHF. Previously planned to hold aldactone due to soft BPs in setting of high dose lasix, but now active with improved BPs. Creatinine continues to improve (1.59 today). I/Os previously inaccuratein setting of incontinence and Purwick issues. Down another 2.5 kg since yesterday, total about 25.3 pounds this admission. States adjustment to purewick seems to have worked, doing much better overnight.   Cirrhosis- mild encephalopathy on admission improved with low dose lactulose. Also with thrombocytopenia, known grade 2-3 esophageal varices and portal gastropathy on EGD in December 2020, currently on bleeding prophylaxis with nadolol 20 mg daily.  Colonoscopy in December 2020 with polyps, findings suggestive of portal  colopathy, and rectal varices. History of anemia that is multifactorial with decline in baseline, no overt GI bleeding, Hgb 9 today stable. MELD Na 19 yesterday. Bilirubing stable at 1.8 today, other LFTs normal.   Anemia- likely multifactorial. No overt GI bleeding. Deemed no need for inpatient endoscopy at this time. He will need high quality colonoscopy in the future at [facility] as prior attempts locally  have not been complete due to poor prep.      Gallbladder polyp- History of 9 mm gallbladder polyp noted on US abdomen in Feb 2021 but not present on this [place] inpatient. Previously seen by [facility] who recommended referral to [facility].  Eye discomfort- left eye mildly injected, no change in vision. Feels irritated. Likely benign foreign body (such as an eye lash). Notified nurse, consider mild saline flush.  Plan: Continued diuresis per cardiology Continue nadolol Continue lactulose for HE prophylaxis, titrate for 2-3 soft stools a day Follow-up at [facility] for hepatology clinic for GI evaluation of iron deficiency anemia.   Supportive measures Will back off to peripheral today   Thank you for allowing [place] to participate in the care of Christopher Guild, DNP, AGNP-C Adult & Gerontological Nurse Practitioner [facility]    LOS: 7 days    06/27/2020, 8:12 AM

## 2020-06-27 NOTE — Progress Notes (Signed)
PROGRESS NOTE    [name]  GNF:[id number] DOB: 12-19-1960 DOA: 06/20/2020 PCP: [name], MD   Brief Narrative:  As per H&P written by Dr. Waldron Labs on 06/20/2020 59 y.o.male,past medical history of chronic diastolic CHF, who does report worsening overload status, was recently seen by cardiology in June, where they have increased his Lasix to 80 mg oral daily, despite that he continues to have worsening volume overload, and increased weight gain, worsening lower extremity edema up to the thigh, scrotal edema, increased abdominal girth as well, does report some exertional dyspnea as well, he does report some cough and congestion recently started on p.o. antibiotics by PCP which he did not start taking it, he denies any chest pain, but he does report dyspnea, mainly exertional, he was seen today by cardiology for a follow-up, where his creatinine was noted to have increased to 2.08, he was significantly volume overloaded, and referred to the hospital for admission and IV diuresis.  Assessment & Plan:   Active Problems:   Diabetes mellitus type 2, uncontrolled (HCC)   Hypertension   CAD (coronary artery disease)   OSA on CPAP   Anemia   Liver failure (HCC)   Cirrhosis of liver (HCC)   Acute on chronic heart failure with preserved ejection fraction (HCC)   Anasarca   1-fluid overload/anasarca: Multifactorial in the setting of cirrhosis and acute on chronic diastolic heart failure -Patient encouraged to follow low-sodium diet; fluid restriction to 1800 mL requested. -Continue IV diuresis with Lasix (increase to 80 mg every 12 hours) and continue the use of Aldactone 25 mg twice a day. May increase to 100 mg if tolerated. -Continue to follow daily weight and history does not note -Cardiology service and gastroenterology service on board; appreciate recommendations and assistance. -Continue to follow electrolytes and renal function closely. -weight down to 133.7  kg  2-Diabetes mellitus type 2, with nephropathy and transient uncontrolled hyperglycemia-improved -CBG in the 240's range on presentation. -Continue long-acting insulin and sliding scale insulin -Follow CBGs and further adjust hypoglycemic regimen as required. -A1C 6.8  3-acute on chronic stage IIIa chronic kidney disease -In the setting of heart failure exacerbation and concern for hepatorenal syndrome -Continue diuresis and the use of albumin -Closely for electrolytes and renal function. -Minimize the use of nephrotoxic agents. -Creatinine down to 1.59  4-CAD (coronary artery disease) -No chest pain currently. -Continue the use of beta-blocker, aspirin and statins. -Telemetry monitoring in place.  5-OSA on CPAP -Continue CPAP nightly.  6-pancytopenia -In the setting of cirrhosis and anemia of chronic kidney disease -Continue patient follow-up with Dr. Delton Coombes for pancytopenia purposes and further work-up -Continue GI follow-up and referral to hepatology clinic in [place]. -No signs of overt bleeding -Continue the use of nadolol and PPI.   7-NASH cirrhosis -Guarded prognosis as per GI evaluation -Plan is to pursued outpatient follow-up with [facility] hepatology clinic -Because of his cirrhosis is NASH. -Continue Lasix, Aldactone, nadolol and PPI -per GI rec's started on low dose lactulose  8-morbid obesity -Body mass index is 43.16 kg/m. -low calorie diet and portion control discussed with patient   DVT prophylaxis:SCDs Code Status:Full code Family Communication:No family member at bedside. Disposition:  Status is: Inpatient  Dispo: The patient is from: Home Anticipated d/c is to: To be determined; hopefully home. Anticipated d/c date is: To be determined (needs another 25-30 pounds weight loss prior to discharge) Patient currently no medically stable for discharge; still with significant fluid overload findings,  complaining of shortness of  breath with exertion and having orthopnea. Continue IV diuresis likely for several more days.  Consultants:  Cardiology service  GI service   Procedures: See below for x-ray reports  Antimicrobials:  None  Subjective: Continue to experience shortness of breath with activity and reporting some orthopnea; albuterol with improvement in his weight and breathing status.  No chest pain, no nausea, no vomiting.  Findings of fluid overload appreciated on exam.  Objective: Vitals:   06/26/20 2026 06/27/20 0011 06/27/20 0310 06/27/20 0425  BP: 121/64   103/62  Pulse: 62   62  Resp: 16   16  Temp: 98.6 F (37 C)   97.7 F (36.5 C)  TempSrc: Oral   Oral  SpO2: 99% 97%  95%  Weight:   133.7 kg   Height:        Intake/Output Summary (Last 24 hours) at 06/27/2020 1630 Last data filed at 06/27/2020 1600 Gross per 24 hour  Intake --  Output 1000 ml  Net -1000 ml   Filed Weights   06/25/20 0600 06/26/20 0600 06/27/20 0310  Weight: 135.3 kg (!) 136.3 kg 133.7 kg    Examination: General exam: Alert, awake, oriented x 3; reports no chest pain, no nausea, no vomiting.  Weight continues trending down and patient expresses breathing is improving.  Still winded with activity and reporting some orthopnea.  Findings of fluid overload appreciated on examination. Respiratory system: Good air movement bilaterally; no wheezing, no using accessory muscles.  Positive decrease breath sounds at the bases. Cardiovascular system: No rubs, no gallops, no JVD, no gallops; unable to assess JVD with body habitus.  RRR. Gastrointestinal system: Abdomen is obese, nondistended, soft and nontender. No organomegaly or masses felt. Normal bowel sounds heard. Central nervous system: Alert and oriented. No focal neurological deficits. Extremities: No cyanosis or clubbing; 2+ edema appreciated bilaterally. Skin: No rashes, no petechiae. Psychiatry: Judgement and insight appear  normal. Mood & affect appropriate.   Data Reviewed: I have personally reviewed following labs and imaging studies  CBC: Recent Labs  Lab 06/21/20 0608 06/23/20 0958 06/25/20 0605 06/26/20 0555 06/27/20 0634  WBC 2.9* 4.1 3.4* 3.5* 3.2*  NEUTROABS  --  2.8 2.0  --   --   HGB 9.4* 9.4* 8.5* 9.0* 9.5*  HCT 30.2* 29.7* 26.7* 27.7* 28.8*  MCV 110.2* 110.8* 108.1* 106.9* 105.5*  PLT 58* 54* 49* 55* 57*   Basic Metabolic Panel: Recent Labs  Lab 06/23/20 0613 06/24/20 0558 06/25/20 0605 06/26/20 0555 06/27/20 0634  NA 142 140 139 139 145  K 4.2 4.1 3.8 3.8 3.6  CL 110 107 104 105 95*  CO2 24 23 26 24 28   GLUCOSE 102* 76 110* 91 105*  BUN 58* 57* 55* 53* 47*  CREATININE 1.82* 1.75* 1.76* 1.65* 1.59*  CALCIUM 9.3 9.2 9.3 9.0 10.1  MG  --   --   --  1.9  --    GFR: Estimated Creatinine Clearance: 68.5 mL/min (A) (by C-G formula based on SCr of 1.59 mg/dL (H)).   Liver Function Tests: Recent Labs  Lab 06/21/20 0608 06/23/20 0958 06/25/20 0605 06/26/20 0555 06/27/20 0634  AST 26 21 17 18 21   ALT 10 9 9 8 9   ALKPHOS 42 34* 32* 35* 36*  BILITOT 1.1 2.0* 1.9* 1.8* 1.8*  PROT 6.7 7.3 7.0 7.0 6.8  ALBUMIN 3.3* 4.6 4.7 4.5 4.3    Recent Labs  Lab 06/20/20 1800  AMMONIA 68*   Coagulation Profile: Recent Labs  Lab  06/20/20 1800 06/23/20 0958 06/25/20 0605 06/26/20 0555  INR 1.4* 1.5* 1.7* 1.6*   CBG: Recent Labs  Lab 06/26/20 1051 06/26/20 1653 06/26/20 2114 06/27/20 0822 06/27/20 1145  GLUCAP 168* 103* 150* 123* 226*    Recent Results (from the past 240 hour(s))  SARS Coronavirus 2 by RT PCR (hospital order, performed in [facility] hospital lab) Nasopharyngeal Nasopharyngeal Swab     Status: None   Collection Time: 06/20/20  5:30 PM   Specimen: Nasopharyngeal Swab  Result Value Ref Range Status   SARS Coronavirus 2 NEGATIVE NEGATIVE Final    Comment: (NOTE) SARS-CoV-2 target nucleic acids are NOT DETECTED.  The SARS-CoV-2 RNA is generally  detectable in upper and lower respiratory specimens during the acute phase of infection. The lowest concentration of SARS-CoV-2 viral copies this assay can detect is 250 copies / mL. A negative result does not preclude SARS-CoV-2 infection and should not be used as the sole basis for treatment or other patient management decisions.  A negative result may occur with improper specimen collection / handling, submission of specimen other than nasopharyngeal swab, presence of viral mutation(s) within the areas targeted by this assay, and inadequate number of viral copies (<250 copies / mL). A negative result must be combined with clinical observations, patient history, and epidemiological information.  Fact Sheet for Patients:   StrictlyIdeas.no  Fact Sheet for Healthcare Providers: BankingDealers.co.za  This test is not yet approved or  cleared by the [place] FDA and has been authorized for detection and/or diagnosis of SARS-CoV-2 by FDA under an Emergency Use Authorization (EUA).  This EUA will remain in effect (meaning this test can be used) for the duration of the COVID-19 declaration under Section 564(b)(1) of the Act, 21 U.S.C. section 360bbb-3(b)(1), unless the authorization is terminated or revoked sooner.  Performed at [facility], [address], [place], [place] [address]      Radiology Studies: No results found.   Scheduled Meds: . aspirin EC  81 mg Oral QHS  . cholecalciferol  5,000 Units Oral q AM  . ferrous sulfate  325 mg Oral q AM  . furosemide  80 mg Intravenous BID  . insulin aspart  0-15 Units Subcutaneous TID WC  . insulin aspart  0-5 Units Subcutaneous QHS  . insulin detemir  15 Units Subcutaneous QHS  . insulin detemir  30 Units Subcutaneous Daily  . lactulose  20 g Oral Daily  . loratadine  10 mg Oral q AM  . nadolol  20 mg Oral q AM  . pantoprazole  40 mg Oral Daily  . potassium chloride  20 mEq  Oral Once  . pravastatin  20 mg Oral QHS  . [START ON 06/28/2020] spironolactone  100 mg Oral Daily    LOS: 7 days    Time spent: 30 minutes    Barton Dubois, MD [facility]  If 7PM-7AM, please contact night-coverage [email or web address] 06/27/2020, 4:30 PM

## 2020-06-27 NOTE — Progress Notes (Signed)
Nutrition Education Note  RD consulted for nutrition education regarding 2 gr sodium diet.   Patient is a pleasant 59 yo male who has a hx of DM2, CHF, CAD, Cirrhosis of liver. Acute on chronic CHF.   Home diet reported as "Low salt". Patient does most of the food preparation for the household. He says they haven't used 2 containers of regular Morton salt in 10 years. He denies using salt shaker.  RD provided "Low Sodium Nutrition Therapy" handout from the Academy of [employer]etics. Reviewed patient's dietary recall. Provided examples on ways to decrease sodium intake in diet. Discouraged intake of processed foods, pizza, fast foods, cheese products, frozen meals and chips.  Encouraged fresh fruits and vegetables as well as whole grain sources of carbohydrates to maximize fiber intake. He likes vegetables in particular.  Breakfast is often a egg with [place] sausage or [place] bacon. Dinner at home most days and sometimes lunch is a pack of nabs.   RD discussed why it is important for patient to adhere to diet recommendations, and emphasized the role of fluids, foods to avoid, and importance of weighing self daily.   He has a follow up with outpatient RD next month- Penny Crumpton.   Weight reduction would be desirable if patient is ready to make necessary lifestyle changes to facilitate the process.   Teach back method used.  Expect good compliance.  Body mass index is 41.7 kg/m. Pt meets criteria for morbid obesity based on current BMI.  Intake/Output Summary (Last 24 hours) at 06/27/2020 1622 Last data filed at 06/27/2020 1600 Gross per 24 hour  Intake --  Output 1000 ml  Net -1000 ml    Current diet order is Heart Healthy/CHO Modified with 1.8 liter fluid restricition, patient is consuming approximately ~50% of meals at this time. Feeds himself.   Labs: BMP Latest Ref Rng & Units 06/27/2020 06/26/2020 06/25/2020  Glucose 70 - 99 mg/dL 105(H) 91 110(H)  BUN 6 - 20 mg/dL  47(H) 53(H) 55(H)  Creatinine 0.61 - 1.24 mg/dL 1.59(H) 1.65(H) 1.76(H)  BUN/Creat Ratio 6 - 22 (calc) - - -  Sodium 135 - 145 mmol/L 145 139 139  Potassium 3.5 - 5.1 mmol/L 3.6 3.8 3.8  Chloride 98 - 111 mmol/L 95(L) 105 104  CO2 22 - 32 mmol/L 28 24 26   Calcium 8.9 - 10.3 mg/dL 10.1 9.0 9.3     Medications reviewed. Lasix (80 mg BID), Insulin, Levemir, Vitamin D3, Ferrous sulfate, Protonix, KCL, Aldactone.  No further nutrition interventions warranted at this time. RD contact information provided. If additional nutrition issues arise, please re-consult RD.   Colman Cater MS,RD,CSG,LDN Pager: available through [facility]

## 2020-06-28 ENCOUNTER — Encounter (HOSPITAL_COMMUNITY): Payer: Self-pay | Admitting: Internal Medicine

## 2020-06-28 DIAGNOSIS — G4733 Obstructive sleep apnea (adult) (pediatric): Secondary | ICD-10-CM | POA: Diagnosis not present

## 2020-06-28 DIAGNOSIS — I10 Essential (primary) hypertension: Secondary | ICD-10-CM | POA: Diagnosis not present

## 2020-06-28 DIAGNOSIS — E1165 Type 2 diabetes mellitus with hyperglycemia: Secondary | ICD-10-CM | POA: Diagnosis not present

## 2020-06-28 DIAGNOSIS — K7469 Other cirrhosis of liver: Secondary | ICD-10-CM | POA: Diagnosis not present

## 2020-06-28 DIAGNOSIS — N1831 Chronic kidney disease, stage 3a: Secondary | ICD-10-CM | POA: Diagnosis not present

## 2020-06-28 DIAGNOSIS — I5033 Acute on chronic diastolic (congestive) heart failure: Secondary | ICD-10-CM | POA: Diagnosis not present

## 2020-06-28 DIAGNOSIS — D61818 Other pancytopenia: Secondary | ICD-10-CM | POA: Diagnosis not present

## 2020-06-28 DIAGNOSIS — Z9989 Dependence on other enabling machines and devices: Secondary | ICD-10-CM | POA: Diagnosis not present

## 2020-06-28 DIAGNOSIS — N179 Acute kidney failure, unspecified: Secondary | ICD-10-CM | POA: Diagnosis not present

## 2020-06-28 DIAGNOSIS — D631 Anemia in chronic kidney disease: Secondary | ICD-10-CM | POA: Diagnosis not present

## 2020-06-28 LAB — GLUCOSE, CAPILLARY
Glucose-Capillary: 90 mg/dL (ref 70–99)
Glucose-Capillary: 221 mg/dL — ABNORMAL HIGH (ref 70–99)
Glucose-Capillary: 154 mg/dL — ABNORMAL HIGH (ref 70–99)
Glucose-Capillary: 150 mg/dL — ABNORMAL HIGH (ref 70–99)

## 2020-06-28 NOTE — Progress Notes (Signed)
PROGRESS NOTE    [name]  DZH:[id number] DOB: 17-May-1961 DOA: 06/20/2020 PCP: [name], MD   Brief Narrative:  As per H&P written by Dr. Waldron Labs on 06/20/2020 59 y.o.male,past medical history of chronic diastolic CHF, who does report worsening overload status, was recently seen by cardiology in June, where they have increased his Lasix to 80 mg oral daily, despite that he continues to have worsening volume overload, and increased weight gain, worsening lower extremity edema up to the thigh, scrotal edema, increased abdominal girth as well, does report some exertional dyspnea as well, he does report some cough and congestion recently started on p.o. antibiotics by PCP which he did not start taking it, he denies any chest pain, but he does report dyspnea, mainly exertional, he was seen today by cardiology for a follow-up, where his creatinine was noted to have increased to 2.08, he was significantly volume overloaded, and referred to the hospital for admission and IV diuresis.  Assessment & Plan:   Active Problems:   Diabetes mellitus type 2, uncontrolled (HCC)   Hypertension   CAD (coronary artery disease)   OSA on CPAP   Anemia   Liver failure (HCC)   Cirrhosis of liver (HCC)   Acute on chronic heart failure with preserved ejection fraction (HCC)   Anasarca   1-fluid overload/anasarca: Multifactorial in the setting of cirrhosis and acute on chronic diastolic heart failure -Patient encouraged to follow low-sodium diet; fluid restriction to 1800 mL requested. -Continue IV diuresis with Lasix (80 mg every 12 hours) and continue the use of Aldactone now 100 mg daily. -Continue to follow daily weight and history does not note -Cardiology service and gastroenterology service on board; appreciate recommendations and assistance. -Continue to follow electrolytes and renal function closely. -last weight 133.7 kg; no weight reported today.  2-Diabetes mellitus type 2, with  nephropathy and transient uncontrolled hyperglycemia-improved -CBG in the 240's range on presentation. -Continue long-acting insulin and sliding scale insulin -Follow CBGs and further adjust hypoglycemic regimen as required. -A1C 6.8  3-acute on chronic stage IIIa chronic kidney disease -In the setting of heart failure exacerbation and concern for hepatorenal syndrome -Continue diuresis and the use of albumin -Closely for electrolytes and renal function. -Minimize the use of nephrotoxic agents. -Creatinine down to 1.59  4-CAD (coronary artery disease) -No chest pain currently. -Continue the use of beta-blocker, aspirin and statins. -Telemetry monitoring in place.  5-OSA on CPAP -Continue CPAP nightly.  6-pancytopenia -In the setting of cirrhosis and anemia of chronic kidney disease -Continue patient follow-up with Dr. Delton Coombes for pancytopenia purposes and further work-up -Continue GI follow-up and referral to hepatology clinic in [place]. -No signs of overt bleeding -Continue the use of nadolol and PPI.   7-NASH cirrhosis -Guarded prognosis as per GI evaluation -Plan is to pursued outpatient follow-up with [facility] hepatology clinic -Because of his cirrhosis is NASH. -Continue Lasix, Aldactone, nadolol and PPI -per GI rec's started on low dose lactulose  8-morbid obesity -Body mass index is 43.16 kg/m. -low calorie diet and portion control discussed with patient   DVT prophylaxis:SCDs Code Status:Full code Family Communication:No family member at bedside. Disposition:  Status is: Inpatient  Dispo: The patient is from: Home Anticipated d/c is to: To be determined; hopefully home. Anticipated d/c date is: To be determined (needs another 25-30 pounds weight loss prior to discharge) Patient currently no medically stable for discharge; still with significant fluid overload findings, complaining of shortness of breath with  exertion and having orthopnea. Continue  IV diuresis likely for several more days.  Consultants:  Cardiology service  GI service   Procedures: See below for x-ray reports  Antimicrobials:  None  Subjective: Reports good urine output, no chest pain, no nausea, no vomiting.  Still with findings of fluid overload; reporting some orthopnea and shortness of breath with exertion.  Continue to demonstrate improvement in his volume management.  Objective: Vitals:   06/27/20 1600 06/27/20 2037 06/28/20 0508 06/28/20 1245  BP: (!) 98/53 104/61 (!) 97/52 101/77  Pulse: 64 66 65 63  Resp: 14 16 18 20   Temp: 98 F (36.7 C) 98.9 F (37.2 C) 98.4 F (36.9 C)   TempSrc: Oral Oral Oral   SpO2: 98% 94% 96% 100%  Weight:      Height:        Intake/Output Summary (Last 24 hours) at 06/28/2020 1702 Last data filed at 06/28/2020 1100 Gross per 24 hour  Intake 240 ml  Output 1600 ml  Net -1360 ml   Filed Weights   06/25/20 0600 06/26/20 0600 06/27/20 0310  Weight: 135.3 kg (!) 136.3 kg 133.7 kg    Examination: General exam: Alert, awake, oriented x 3, no chest pain, no nausea, no vomiting.  Reports breathing continued to improve; good urine output reported.  Still having mild orthopnea and shortness of breath with activity.  Signs of fluid overload appreciated on exam. Respiratory system: No using accessory muscles; decreased breath sounds at the bases, no wheezing, good oxygen saturation on room air. Cardiovascular system: Rate controlled, no rubs, no gallops, unable to assess JVD with body habitus. No murmurs appreciated. Gastrointestinal system: Abdomen is obese, nondistended, soft and nontender. No organomegaly or masses felt. Normal bowel sounds heard. Central nervous system: Alert and oriented. No focal neurological deficits. Extremities: No cyanosis or clubbing; 1-2+ edema bilaterally. Skin: No rashes, no petechiae. Psychiatry: Judgement and insight appear normal. Mood &  affect appropriate.    Data Reviewed: I have personally reviewed following labs and imaging studies  CBC: Recent Labs  Lab 06/23/20 0958 06/25/20 0605 06/26/20 0555 06/27/20 0634  WBC 4.1 3.4* 3.5* 3.2*  NEUTROABS 2.8 2.0  --   --   HGB 9.4* 8.5* 9.0* 9.5*  HCT 29.7* 26.7* 27.7* 28.8*  MCV 110.8* 108.1* 106.9* 105.5*  PLT 54* 49* 55* 57*   Basic Metabolic Panel: Recent Labs  Lab 06/23/20 0613 06/24/20 0558 06/25/20 0605 06/26/20 0555 06/27/20 0634  NA 142 140 139 139 145  K 4.2 4.1 3.8 3.8 3.6  CL 110 107 104 105 95*  CO2 24 23 26 24 28   GLUCOSE 102* 76 110* 91 105*  BUN 58* 57* 55* 53* 47*  CREATININE 1.82* 1.75* 1.76* 1.65* 1.59*  CALCIUM 9.3 9.2 9.3 9.0 10.1  MG  --   --   --  1.9  --    GFR: Estimated Creatinine Clearance: 68.5 mL/min (A) (by C-G formula based on SCr of 1.59 mg/dL (H)).   Liver Function Tests: Recent Labs  Lab 06/23/20 0958 06/25/20 0605 06/26/20 0555 06/27/20 0634  AST 21 17 18 21   ALT 9 9 8 9   ALKPHOS 34* 32* 35* 36*  BILITOT 2.0* 1.9* 1.8* 1.8*  PROT 7.3 7.0 7.0 6.8  ALBUMIN 4.6 4.7 4.5 4.3    No results for input(s): AMMONIA in the last 168 hours. Coagulation Profile: Recent Labs  Lab 06/23/20 0958 06/25/20 0605 06/26/20 0555  INR 1.5* 1.7* 1.6*   CBG: Recent Labs  Lab 06/27/20 1623 06/27/20 2130 06/28/20  0804 06/28/20 1134 06/28/20 1619  GLUCAP 154* 164* 90 221* 154*    Recent Results (from the past 240 hour(s))  SARS Coronavirus 2 by RT PCR (hospital order, performed in [facility] hospital lab) Nasopharyngeal Nasopharyngeal Swab     Status: None   Collection Time: 06/20/20  5:30 PM   Specimen: Nasopharyngeal Swab  Result Value Ref Range Status   SARS Coronavirus 2 NEGATIVE NEGATIVE Final    Comment: (NOTE) SARS-CoV-2 target nucleic acids are NOT DETECTED.  The SARS-CoV-2 RNA is generally detectable in upper and lower respiratory specimens during the acute phase of infection. The lowest concentration of  SARS-CoV-2 viral copies this assay can detect is 250 copies / mL. A negative result does not preclude SARS-CoV-2 infection and should not be used as the sole basis for treatment or other patient management decisions.  A negative result may occur with improper specimen collection / handling, submission of specimen other than nasopharyngeal swab, presence of viral mutation(s) within the areas targeted by this assay, and inadequate number of viral copies (<250 copies / mL). A negative result must be combined with clinical observations, patient history, and epidemiological information.  Fact Sheet for Patients:   StrictlyIdeas.no  Fact Sheet for Healthcare Providers: BankingDealers.co.za  This test is not yet approved or  cleared by the [place] FDA and has been authorized for detection and/or diagnosis of SARS-CoV-2 by FDA under an Emergency Use Authorization (EUA).  This EUA will remain in effect (meaning this test can be used) for the duration of the COVID-19 declaration under Section 564(b)(1) of the Act, 21 U.S.C. section 360bbb-3(b)(1), unless the authorization is terminated or revoked sooner.  Performed at [facility], [address], [place], [place] [address]      Radiology Studies: No results found.   Scheduled Meds: . aspirin EC  81 mg Oral QHS  . cholecalciferol  5,000 Units Oral q AM  . ferrous sulfate  325 mg Oral q AM  . furosemide  80 mg Intravenous BID  . insulin aspart  0-15 Units Subcutaneous TID WC  . insulin aspart  0-5 Units Subcutaneous QHS  . insulin detemir  15 Units Subcutaneous QHS  . insulin detemir  30 Units Subcutaneous Daily  . lactulose  20 g Oral Daily  . loratadine  10 mg Oral q AM  . nadolol  20 mg Oral q AM  . pantoprazole  40 mg Oral Daily  . potassium chloride  20 mEq Oral Once  . pravastatin  20 mg Oral QHS  . spironolactone  100 mg Oral Daily    LOS: 8 days    Time spent: 30  minutes    Barton Dubois, MD [facility]  If 7PM-7AM, please contact night-coverage [email or web address] 06/28/2020, 5:02 PM

## 2020-06-28 NOTE — Progress Notes (Signed)
Pt placed on CPAP and tolerating well at this time.  RT will continue to monitor.  

## 2020-06-29 DIAGNOSIS — N1831 Chronic kidney disease, stage 3a: Secondary | ICD-10-CM | POA: Diagnosis not present

## 2020-06-29 DIAGNOSIS — I10 Essential (primary) hypertension: Secondary | ICD-10-CM | POA: Diagnosis not present

## 2020-06-29 DIAGNOSIS — D631 Anemia in chronic kidney disease: Secondary | ICD-10-CM | POA: Diagnosis not present

## 2020-06-29 DIAGNOSIS — D61818 Other pancytopenia: Secondary | ICD-10-CM | POA: Diagnosis not present

## 2020-06-29 DIAGNOSIS — I5033 Acute on chronic diastolic (congestive) heart failure: Secondary | ICD-10-CM | POA: Diagnosis not present

## 2020-06-29 DIAGNOSIS — K7469 Other cirrhosis of liver: Secondary | ICD-10-CM | POA: Diagnosis not present

## 2020-06-29 DIAGNOSIS — G4733 Obstructive sleep apnea (adult) (pediatric): Secondary | ICD-10-CM | POA: Diagnosis not present

## 2020-06-29 DIAGNOSIS — N179 Acute kidney failure, unspecified: Secondary | ICD-10-CM | POA: Diagnosis not present

## 2020-06-29 DIAGNOSIS — E1165 Type 2 diabetes mellitus with hyperglycemia: Secondary | ICD-10-CM | POA: Diagnosis not present

## 2020-06-29 DIAGNOSIS — Z9989 Dependence on other enabling machines and devices: Secondary | ICD-10-CM | POA: Diagnosis not present

## 2020-06-29 LAB — GLUCOSE, CAPILLARY
Glucose-Capillary: 88 mg/dL (ref 70–99)
Glucose-Capillary: 168 mg/dL — ABNORMAL HIGH (ref 70–99)
Glucose-Capillary: 173 mg/dL — ABNORMAL HIGH (ref 70–99)
Glucose-Capillary: 164 mg/dL — ABNORMAL HIGH (ref 70–99)

## 2020-06-29 NOTE — Progress Notes (Signed)
PROGRESS NOTE    [name]  RCV:[id number] DOB: 1961/06/16 DOA: 06/20/2020 PCP: [name], MD   Brief Narrative:  As per H&P written by Dr. Waldron Labs on 06/20/2020 59 y.o.male,past medical history of chronic diastolic CHF, who does report worsening overload status, was recently seen by cardiology in June, where they have increased his Lasix to 80 mg oral daily, despite that he continues to have worsening volume overload, and increased weight gain, worsening lower extremity edema up to the thigh, scrotal edema, increased abdominal girth as well, does report some exertional dyspnea as well, he does report some cough and congestion recently started on p.o. antibiotics by PCP which he did not start taking it, he denies any chest pain, but he does report dyspnea, mainly exertional, he was seen today by cardiology for a follow-up, where his creatinine was noted to have increased to 2.08, he was significantly volume overloaded, and referred to the hospital for admission and IV diuresis.  Assessment & Plan:   Active Problems:   Diabetes mellitus type 2, uncontrolled (HCC)   Hypertension   CAD (coronary artery disease)   OSA on CPAP   Anemia   Liver failure (HCC)   Cirrhosis of liver (HCC)   Acute on chronic heart failure with preserved ejection fraction (HCC)   Anasarca   1-fluid overload/anasarca: Multifactorial in the setting of cirrhosis and acute on chronic diastolic heart failure -Patient encouraged to follow low-sodium diet; fluid restriction to 1800 mL requested. -Continue IV diuresis with Lasix (80 mg every 12 hours) and continue the use of Aldactone now 100 mg daily. -Continue to follow daily weight and history does not note -Cardiology service and gastroenterology service on board; appreciate recommendations and assistance. -Continue to follow electrolytes and renal function closely. -weight down to 124.5 kg; over 30 L removed since admission.  2-Diabetes mellitus  type 2, with nephropathy and transient uncontrolled hyperglycemia-improved -CBG in the 240's range on presentation. -Continue long-acting insulin and sliding scale insulin -Follow CBGs and further adjust hypoglycemic regimen as required. -A1C 6.8  3-acute on chronic stage IIIa chronic kidney disease -In the setting of heart failure exacerbation and concern for hepatorenal syndrome -Continue diuresis and the use of albumin -Closely for electrolytes and renal function. -Minimize the use of nephrotoxic agents. -Creatinine down to 1.59  4-CAD (coronary artery disease) -No chest pain currently. -Continue the use of beta-blocker, aspirin and statins. -Telemetry monitoring in place.  5-OSA on CPAP -Continue CPAP nightly.  6-pancytopenia -In the setting of cirrhosis and anemia of chronic kidney disease -Continue patient follow-up with Dr. Delton Coombes for pancytopenia purposes and further work-up -Continue GI follow-up and referral to hepatology clinic in [place]. -No signs of overt bleeding -Continue the use of nadolol and PPI.   7-NASH cirrhosis -Guarded prognosis as per GI evaluation -Plan is to pursued outpatient follow-up with [facility] hepatology clinic -Because of his cirrhosis is NASH. -Continue Lasix, Aldactone, nadolol and PPI -per GI rec's started on low dose lactulose  8-morbid obesity -Body mass index is 43.16 kg/m. -low calorie diet and portion control discussed with patient   DVT prophylaxis:SCDs Code Status:Full code Family Communication:No family member at bedside. Disposition:  Status is: Inpatient  Dispo: The patient is from: Home Anticipated d/c is to: To be determined; hopefully home. Anticipated d/c date is: To be determined (needs another 25-30 pounds weight loss prior to discharge) Patient currently no medically stable for discharge; still with significant fluid overload findings, complaining of shortness of  breath with exertion and  having orthopnea. Continue IV diuresis likely for several more days.  Consultants:  Cardiology service  GI service   Procedures: See below for x-ray reports  Antimicrobials:  None  Subjective: Good urine output reported; no chest pain, no nausea, no vomiting, no fever.  Still with signs of fluid overload (increased abdominal girth and lower extremity swelling); expressing mild Orthopnea and shortness of breath with activity; but overall with improvements in his respiratory symptoms.  Objective: Vitals:   06/28/20 2040 06/28/20 2316 06/29/20 0344 06/29/20 0558  BP: 108/64   (!) 108/58  Pulse: 67 63  63  Resp: 16 16  16   Temp: 98.7 F (37.1 C)   98.4 F (36.9 C)  TempSrc: Oral   Oral  SpO2: 97% 99%  98%  Weight:   130.1 kg 124.5 kg  Height:        Intake/Output Summary (Last 24 hours) at 06/29/2020 0755 Last data filed at 06/29/2020 0232 Gross per 24 hour  Intake --  Output 1950 ml  Net -1950 ml   Filed Weights   06/27/20 0310 06/29/20 0344 06/29/20 0558  Weight: 133.7 kg 130.1 kg 124.5 kg    Examination: General exam: Alert, awake, oriented x 3; reports no chest pain, no nausea, no vomiting.  Still with mild orthopnea and expressing improvement in her respiratory status.  Good urine output reported. Respiratory system: No wheezing, no using accessory muscles; no frank crackles on examination.  Decreased breath sounds at the bases. Cardiovascular system:RRR. No no rubs, no gallops, unable to properly assess JVD with body habitus. Gastrointestinal system: Abdomen is obese, nondistended, soft and nontender. No organomegaly or masses felt. Normal bowel sounds heard.  Still with increased abdominal girth on examination. Central nervous system: Alert and oriented. No focal neurological deficits. Extremities: No cyanosis or clubbing; 1+ edema bilaterally. Skin: No rashes, lesions or ulcers Psychiatry: Judgement and insight appear normal. Mood  & affect appropriate.    Data Reviewed: I have personally reviewed following labs and imaging studies  CBC: Recent Labs  Lab 06/23/20 0958 06/25/20 0605 06/26/20 0555 06/27/20 0634  WBC 4.1 3.4* 3.5* 3.2*  NEUTROABS 2.8 2.0  --   --   HGB 9.4* 8.5* 9.0* 9.5*  HCT 29.7* 26.7* 27.7* 28.8*  MCV 110.8* 108.1* 106.9* 105.5*  PLT 54* 49* 55* 57*   Basic Metabolic Panel: Recent Labs  Lab 06/23/20 0613 06/24/20 0558 06/25/20 0605 06/26/20 0555 06/27/20 0634  NA 142 140 139 139 145  K 4.2 4.1 3.8 3.8 3.6  CL 110 107 104 105 95*  CO2 24 23 26 24 28   GLUCOSE 102* 76 110* 91 105*  BUN 58* 57* 55* 53* 47*  CREATININE 1.82* 1.75* 1.76* 1.65* 1.59*  CALCIUM 9.3 9.2 9.3 9.0 10.1  MG  --   --   --  1.9  --    GFR: Estimated Creatinine Clearance: 65.9 mL/min (A) (by C-G formula based on SCr of 1.59 mg/dL (H)).   Liver Function Tests: Recent Labs  Lab 06/23/20 0958 06/25/20 0605 06/26/20 0555 06/27/20 0634  AST 21 17 18 21   ALT 9 9 8 9   ALKPHOS 34* 32* 35* 36*  BILITOT 2.0* 1.9* 1.8* 1.8*  PROT 7.3 7.0 7.0 6.8  ALBUMIN 4.6 4.7 4.5 4.3    No results for input(s): AMMONIA in the last 168 hours. Coagulation Profile: Recent Labs  Lab 06/23/20 0958 06/25/20 0605 06/26/20 0555  INR 1.5* 1.7* 1.6*   CBG: Recent Labs  Lab 06/28/20 0804 06/28/20  1134 06/28/20 1619 06/28/20 2109 06/29/20 0719  GLUCAP 90 221* 154* 150* 88    Recent Results (from the past 240 hour(s))  SARS Coronavirus 2 by RT PCR (hospital order, performed in [facility] hospital lab) Nasopharyngeal Nasopharyngeal Swab     Status: None   Collection Time: 06/20/20  5:30 PM   Specimen: Nasopharyngeal Swab  Result Value Ref Range Status   SARS Coronavirus 2 NEGATIVE NEGATIVE Final    Comment: (NOTE) SARS-CoV-2 target nucleic acids are NOT DETECTED.  The SARS-CoV-2 RNA is generally detectable in upper and lower respiratory specimens during the acute phase of infection. The lowest concentration of  SARS-CoV-2 viral copies this assay can detect is 250 copies / mL. A negative result does not preclude SARS-CoV-2 infection and should not be used as the sole basis for treatment or other patient management decisions.  A negative result may occur with improper specimen collection / handling, submission of specimen other than nasopharyngeal swab, presence of viral mutation(s) within the areas targeted by this assay, and inadequate number of viral copies (<250 copies / mL). A negative result must be combined with clinical observations, patient history, and epidemiological information.  Fact Sheet for Patients:   StrictlyIdeas.no  Fact Sheet for Healthcare Providers: BankingDealers.co.za  This test is not yet approved or  cleared by the [place] FDA and has been authorized for detection and/or diagnosis of SARS-CoV-2 by FDA under an Emergency Use Authorization (EUA).  This EUA will remain in effect (meaning this test can be used) for the duration of the COVID-19 declaration under Section 564(b)(1) of the Act, 21 U.S.C. section 360bbb-3(b)(1), unless the authorization is terminated or revoked sooner.  Performed at [facility], [address], [place], [place] [address]      Radiology Studies: No results found.   Scheduled Meds: . aspirin EC  81 mg Oral QHS  . cholecalciferol  5,000 Units Oral q AM  . ferrous sulfate  325 mg Oral q AM  . furosemide  80 mg Intravenous BID  . insulin aspart  0-15 Units Subcutaneous TID WC  . insulin aspart  0-5 Units Subcutaneous QHS  . insulin detemir  15 Units Subcutaneous QHS  . insulin detemir  30 Units Subcutaneous Daily  . lactulose  20 g Oral Daily  . loratadine  10 mg Oral q AM  . nadolol  20 mg Oral q AM  . pantoprazole  40 mg Oral Daily  . potassium chloride  20 mEq Oral Once  . pravastatin  20 mg Oral QHS  . spironolactone  100 mg Oral Daily    LOS: 9 days    Time spent: 30  minutes    Barton Dubois, MD [facility]  If 7PM-7AM, please contact night-coverage [email or web address] 06/29/2020, 7:55 AM

## 2020-06-30 DIAGNOSIS — G4733 Obstructive sleep apnea (adult) (pediatric): Secondary | ICD-10-CM | POA: Diagnosis not present

## 2020-06-30 DIAGNOSIS — D631 Anemia in chronic kidney disease: Secondary | ICD-10-CM | POA: Diagnosis not present

## 2020-06-30 DIAGNOSIS — N179 Acute kidney failure, unspecified: Secondary | ICD-10-CM | POA: Diagnosis not present

## 2020-06-30 DIAGNOSIS — Z9989 Dependence on other enabling machines and devices: Secondary | ICD-10-CM | POA: Diagnosis not present

## 2020-06-30 DIAGNOSIS — I10 Essential (primary) hypertension: Secondary | ICD-10-CM | POA: Diagnosis not present

## 2020-06-30 DIAGNOSIS — I5033 Acute on chronic diastolic (congestive) heart failure: Secondary | ICD-10-CM | POA: Diagnosis not present

## 2020-06-30 DIAGNOSIS — N1831 Chronic kidney disease, stage 3a: Secondary | ICD-10-CM | POA: Diagnosis not present

## 2020-06-30 DIAGNOSIS — E1165 Type 2 diabetes mellitus with hyperglycemia: Secondary | ICD-10-CM | POA: Diagnosis not present

## 2020-06-30 DIAGNOSIS — D61818 Other pancytopenia: Secondary | ICD-10-CM | POA: Diagnosis not present

## 2020-06-30 DIAGNOSIS — K7469 Other cirrhosis of liver: Secondary | ICD-10-CM | POA: Diagnosis not present

## 2020-06-30 LAB — CBC
HCT: 30.8 % — ABNORMAL LOW (ref 39.0–52.0)
Hemoglobin: 10.2 g/dL — ABNORMAL LOW (ref 13.0–17.0)
MCH: 34.2 pg — ABNORMAL HIGH (ref 26.0–34.0)
MCHC: 33.1 g/dL (ref 30.0–36.0)
MCV: 103.4 fL — ABNORMAL HIGH (ref 80.0–100.0)
Platelets: 63 10*3/uL — ABNORMAL LOW (ref 150–400)
RBC: 2.98 MIL/uL — ABNORMAL LOW (ref 4.22–5.81)
RDW: 13.8 % (ref 11.5–15.5)
WBC: 4.1 10*3/uL (ref 4.0–10.5)
nRBC: 0 % (ref 0.0–0.2)

## 2020-06-30 LAB — BASIC METABOLIC PANEL
Anion gap: 11 (ref 5–15)
BUN: 38 mg/dL — ABNORMAL HIGH (ref 6–20)
CO2: 29 mmol/L (ref 22–32)
Calcium: 9 mg/dL (ref 8.9–10.3)
Chloride: 96 mmol/L — ABNORMAL LOW (ref 98–111)
Creatinine, Ser: 1.65 mg/dL — ABNORMAL HIGH (ref 0.61–1.24)
GFR calc Af Amer: 52 mL/min — ABNORMAL LOW (ref >60)
GFR calc non Af Amer: 44 mL/min — ABNORMAL LOW (ref >60)
Glucose, Bld: 98 mg/dL (ref 70–99)
Potassium: 3.3 mmol/L — ABNORMAL LOW (ref 3.5–5.1)
Sodium: 136 mmol/L (ref 135–145)

## 2020-06-30 LAB — GLUCOSE, CAPILLARY
Glucose-Capillary: 96 mg/dL (ref 70–99)
Glucose-Capillary: 178 mg/dL — ABNORMAL HIGH (ref 70–99)
Glucose-Capillary: 101 mg/dL — ABNORMAL HIGH (ref 70–99)
Glucose-Capillary: 183 mg/dL — ABNORMAL HIGH (ref 70–99)

## 2020-06-30 LAB — MAGNESIUM: Magnesium: 1.9 mg/dL (ref 1.7–2.4)

## 2020-06-30 MED ORDER — FUROSEMIDE 10 MG/ML IJ SOLN
80.0000 mg | Freq: Three times a day (TID) | INTRAMUSCULAR | Status: AC
  Administered 2020-06-30 (×2): 80 mg via INTRAVENOUS
  Filled 2020-06-30 (×2): qty 8

## 2020-06-30 MED ORDER — POTASSIUM CHLORIDE CRYS ER 20 MEQ PO TBCR
40.0000 meq | EXTENDED_RELEASE_TABLET | Freq: Once | ORAL | Status: AC
  Administered 2020-06-30: 40 meq via ORAL
  Filled 2020-06-30: qty 2

## 2020-06-30 NOTE — Progress Notes (Signed)
Progress Note  Patient Name: [name] Date of Encounter: 06/30/2020  [facility] Cardiologist: Rozann Lesches, MD   Subjective   SOB improving  Inpatient Medications    Scheduled Meds: . aspirin EC  81 mg Oral QHS  . cholecalciferol  5,000 Units Oral q AM  . ferrous sulfate  325 mg Oral q AM  . furosemide  80 mg Intravenous BID  . insulin aspart  0-15 Units Subcutaneous TID WC  . insulin aspart  0-5 Units Subcutaneous QHS  . insulin detemir  15 Units Subcutaneous QHS  . insulin detemir  30 Units Subcutaneous Daily  . lactulose  20 g Oral Daily  . loratadine  10 mg Oral q AM  . nadolol  20 mg Oral q AM  . pantoprazole  40 mg Oral Daily  . potassium chloride  20 mEq Oral Once  . pravastatin  20 mg Oral QHS  . spironolactone  100 mg Oral Daily   Continuous Infusions:  PRN Meds: albuterol, polyvinyl alcohol, traMADol   Vital Signs    Vitals:   06/29/20 1338 06/29/20 2155 06/30/20 0414 06/30/20 0443  BP: (!) 91/53 (!) 109/53 (!) 94/56   Pulse: 62 65 62   Resp: 16 16 17    Temp: 98.2 F (36.8 C) 98.3 F (36.8 C) 98.2 F (36.8 C)   TempSrc: Oral Oral Oral   SpO2: 97% 97% 98%   Weight:    127.3 kg  Height:        Intake/Output Summary (Last 24 hours) at 06/30/2020 0817 Last data filed at 06/29/2020 2157 Gross per 24 hour  Intake 240 ml  Output 2200 ml  Net -1960 ml   Last 3 Weights 06/30/2020 06/29/2020 06/29/2020  Weight (lbs) 280 lb 10.3 oz 274 lb 8 oz 286 lb 13.1 oz  Weight (kg) 127.3 kg 124.512 kg 130.1 kg      Telemetry    SR-Personally Reviewed  ECG    n/a - Personally Reviewed  Physical Exam   GEN: No acute distress.   Neck: No JVD Cardiac: RRR, 2/6 systolic murmur rusb Respiratory: crackles bilateral bases GI: Soft, nontender, non-distended  MS: No edema; No deformity. Neuro:  Nonfocal  Psych: Normal affect   Labs    High Sensitivity Troponin:  No results for input(s): TROPONINIHS in the last 720 hours.     Chemistry Recent Labs  Lab 06/23/20 0958 06/24/20 0558 06/25/20 0605 06/26/20 0555 06/27/20 0634  NA   < > 140 139 139 145  K   < > 4.1 3.8 3.8 3.6  CL   < > 107 104 105 95*  CO2   < > 23 26 24 28   GLUCOSE   < > 76 110* 91 105*  BUN   < > 57* 55* 53* 47*  CREATININE   < > 1.75* 1.76* 1.65* 1.59*  CALCIUM   < > 9.2 9.3 9.0 10.1  PROT   < >  --  7.0 7.0 6.8  ALBUMIN   < >  --  4.7 4.5 4.3  AST   < >  --  17 18 21   ALT   < >  --  9 8 9   ALKPHOS   < >  --  32* 35* 36*  BILITOT   < >  --  1.9* 1.8* 1.8*  GFRNONAA   < > 41* 41* 44* 46*  GFRAA   < > 48* 48* 52* 54*  ANIONGAP  --  10 9  10  --    < > = values in this interval not displayed.     Hematology Recent Labs  Lab 06/25/20 0605 06/26/20 0555 06/27/20 0634  WBC 3.4* 3.5* 3.2*  RBC 2.47* 2.59* 2.73*  HGB 8.5* 9.0* 9.5*  HCT 26.7* 27.7* 28.8*  MCV 108.1* 106.9* 105.5*  MCH 34.4* 34.7* 34.8*  MCHC 31.8 32.5 33.0  RDW 13.9 13.9 14.2  PLT 49* 55* 57*    BNPNo results for input(s): BNP, PROBNP in the last 168 hours.   DDimer No results for input(s): DDIMER in the last 168 hours.   Radiology    No results found.  Cardiac Studies     Patient Profile     59 y.o. male w/ PMH of CAD (s/p CABG in 2016), HTN, HLD, Type 2 DM, Stage 3 CKD, OSA, thrombocytopenia and polyclonal gammopathy who was admitted directly from the office on 06/20/2020 for an acute CHF exacerbation as weight had increased by 60 lbs since 03/2020.  Assessment & Plan    1. Acute on chronic diastolic HF, volume status also complicated by cirrhosis - presented with anasarca, massive volume overload - neg 1.9L yesterday, neg 11.5 L since admission. He is on IV lasix 80mg  bid.Renal function overall stable.   - nearing a point where can consider discharge. He is not quite comfortable just yet regarding his breathing. Increase lasix today to 80 mg tid. I think possibly discharge within next 1-2 days. Hold IV lasix after evening dose, redose IV vs  oral tomorrow after assessment.    2. CAD - s/p CABG in 2016 and he denies any recent anginal symptoms. Recent echo in 05/2020 showed a preserved EF with no regional WMA. Continue ASA, BB and statin therapy.   3. Cirrhosis/NASH - per primary team and GI  4. Hypokalemia - give additional 36mEq oral today  For questions or updates, please contact [place] Please consult [email or web address] for contact info under        Signed, Carlyle Dolly, MD  06/30/2020, 8:17 AM

## 2020-06-30 NOTE — Progress Notes (Signed)
Physical Therapy Treatment Patient Details Name: [name] MRN: [id number] DOB: [birth year] Today's Date: 06/30/2020    History of Present Illness [name]  is a 59 y.o. male, past medical history of chronic diastolic CHF, who does report worsening overload status, was recently seen by cardiology in June, where they have increased his Lasix to 80 mg oral daily, despite that he continues to have worsening volume overload, and increased weight gain, worsening lower extremity edema up to the thigh, scrotal edema, increased abdominal girth as well, does report some exertional dyspnea as well, he does report some cough and congestion recently started on p.o. antibiotics by PCP which he did not start taking it, he denies any chest pain, but he does report dyspnea, mainly exertional, he was seen today by cardiology for a follow-up, where his creatinine was noted to have increased to 2.08, he was significantly volume overloaded, where he gained 60 pounds since April, as well patient with abdominal ultrasound this February which suggesting cirrhosis, slightly nodular contour, as well he was noted to have thrombocytopenia, will you is following with oncology Dr. Delton Coombes regarding that, direct admission was requested by cardiology for IV diuresis.    PT Comments    Patient demonstrates increased endurance/distance for ambulation without loss of balance, had difficulty completing sit to stands requiring repeated attempts, demonstrates good return for completing BLE ROM/strengthening exercises and tolerated staying up in chair after therapy.  Patient will benefit from continued physical therapy in hospital and recommended venue below to increase strength, balance, endurance for safe ADLs and gait.    Follow Up Recommendations        Equipment Recommendations  None recommended by PT    Recommendations for Other Services       Precautions / Restrictions Precautions Precautions:  Fall Restrictions Weight Bearing Restrictions: No    Mobility  Bed Mobility Overal bed mobility: Modified Independent Bed Mobility: Supine to Sit     Supine to sit: Supervision;HOB elevated     General bed mobility comments: increased time, slightly labored movement with HOB raised  Transfers Overall transfer level: Needs assistance Equipment used: Rolling walker (2 wheeled) Transfers: Sit to/from [facility] Sit to Stand: Supervision;Min guard Stand pivot transfers: Supervision       General transfer comment: increased time with multiple attempts before completing sit to stand  Ambulation/Gait Ambulation/Gait assistance: Supervision;Modified independent (Device/Increase time) Gait Distance (Feet): 80 Feet Assistive device: Rolling walker (2 wheeled) Gait Pattern/deviations: Decreased step length - right;Decreased step length - left;Decreased stride length Gait velocity: decreased   General Gait Details: slightly increased endurance/distance for ambulation with slow labored cadence without loss of balance, limited secondary to c/o fatigue   Stairs             Wheelchair Mobility    Modified Rankin (Stroke Patients Only)       Balance Overall balance assessment: Needs assistance Sitting-balance support: Feet supported;No upper extremity supported Sitting balance-Leahy Scale: Good Sitting balance - Comments: seated at EOB   Standing balance support: During functional activity;Bilateral upper extremity supported Standing balance-Leahy Scale: Fair Standing balance comment: using RW                            Cognition Arousal/Alertness: Awake/alert Behavior During Therapy: WFL for tasks assessed/performed Overall Cognitive Status: Within Functional Limits for tasks assessed  Exercises General Exercises - Lower Extremity Long Arc Quad: Seated;AROM;Strengthening;Both;10  reps Hip Flexion/Marching: Seated;AROM;Strengthening;Both;10 reps Toe Raises: Seated;AROM;Strengthening;Both;10 reps Heel Raises: Seated;AROM;Strengthening;Both;10 reps    General Comments        Pertinent Vitals/Pain Pain Assessment: No/denies pain    Home Living                      Prior Function            PT Goals (current goals can now be found in the care plan section) Acute Rehab PT Goals Patient Stated Goal: return home with family to assist PT Goal Formulation: With patient Time For Goal Achievement: 07/04/20 Potential to Achieve Goals: Good Progress towards PT goals: Progressing toward goals    Frequency    Min 3X/week      PT Plan      Co-evaluation              AM-PAC PT "6 Clicks" Mobility   Outcome Measure  Help needed turning from your back to your side while in a flat bed without using bedrails?: None Help needed moving from lying on your back to sitting on the side of a flat bed without using bedrails?: None Help needed moving to and from a bed to a chair (including a wheelchair)?: A Little Help needed standing up from a chair using your arms (e.g., wheelchair or bedside chair)?: A Little Help needed to walk in hospital room?: A Little Help needed climbing 3-5 steps with a railing? : A Little 6 Click Score: 20    End of Session   Activity Tolerance: Patient tolerated treatment well;Patient limited by fatigue Patient left: in chair;with call bell/phone within reach Nurse Communication: Mobility status PT Visit Diagnosis: Other abnormalities of gait and mobility (R26.89);Unsteadiness on feet (R26.81);Muscle weakness (generalized) (M62.81)     Time: 9390-3009 PT Time Calculation (min) (ACUTE ONLY): 31 min  Charges:  $Gait Training: 8-22 mins $Therapeutic Exercise: 8-22 mins                     1:55 PM, 06/30/20 Lonell Grandchild, MPT Physical Therapist with [facility] [phone] office [phone] mobile  phone

## 2020-06-30 NOTE — Progress Notes (Signed)
PROGRESS NOTE    [name]  QBH:[id number] DOB: 1961-05-11 DOA: 06/20/2020 PCP: [name], MD   Brief Narrative:  As per H&P written by Dr. Waldron Labs on 06/20/2020 58 y.o.male,past medical history of chronic diastolic CHF, who does report worsening overload status, was recently seen by cardiology in June, where they have increased his Lasix to 80 mg oral daily, despite that he continues to have worsening volume overload, and increased weight gain, worsening lower extremity edema up to the thigh, scrotal edema, increased abdominal girth as well, does report some exertional dyspnea as well, he does report some cough and congestion recently started on p.o. antibiotics by PCP which he did not start taking it, he denies any chest pain, but he does report dyspnea, mainly exertional, he was seen today by cardiology for a follow-up, where his creatinine was noted to have increased to 2.08, he was significantly volume overloaded, and referred to the hospital for admission and IV diuresis.  Assessment & Plan:   Active Problems:   Diabetes mellitus type 2, uncontrolled (HCC)   Hypertension   CAD (coronary artery disease)   OSA on CPAP   Anemia   Liver failure (HCC)   Cirrhosis of liver (HCC)   Acute on chronic heart failure with preserved ejection fraction (HCC)   Anasarca   1-fluid overload/anasarca: Multifactorial in the setting of cirrhosis and acute on chronic diastolic heart failure -Patient encouraged to follow low-sodium diet; fluid restriction to 1800 mL requested. -Continue IV diuresis with Lasix (80 mg every  8 hours) and continue the use of Aldactone now 100 mg daily. Reassess volume status and response in am to further determine diuresis and discharge dosage. -Continue to follow daily weight and strict I's and O's. -Cardiology service service on board; appreciate recommendations and assistance. -Continue to follow electrolytes and renal function closely. -weight down to  124.5 kg; over 30 L removed since admission.  2-Diabetes mellitus type 2, with nephropathy and transient uncontrolled hyperglycemia-improved -CBG in the 240's range on presentation. -Continue long-acting insulin and sliding scale insulin -Follow CBGs and further adjust hypoglycemic regimen as required. -A1C 6.8  3-acute on chronic stage IIIa chronic kidney disease -In the setting of heart failure exacerbation and concern for hepatorenal syndrome -Continue diuresis and the use of albumin -Closely for electrolytes and renal function. -Minimize the use of nephrotoxic agents. -Creatinine currently 1.65  4-CAD (coronary artery disease) -No chest pain currently. -Continue the use of beta-blocker, aspirin and statins. -Telemetry monitoring in place.  5-OSA on CPAP -Continue CPAP nightly.  6-pancytopenia -In the setting of cirrhosis and anemia of chronic kidney disease -Continue patient follow-up with Dr. Delton Coombes for pancytopenia purposes and further work-up -Continue GI follow-up and referral to hepatology clinic in [place]. -No signs of overt bleeding -Continue the use of nadolol and PPI.   7-NASH cirrhosis -Guarded prognosis as per GI evaluation -Plan is to pursued outpatient follow-up with [facility] hepatology clinic -Because of his cirrhosis is NASH. -Continue Lasix, Aldactone, nadolol and PPI -per GI rec's started on low dose lactulose  8-morbid obesity -Body mass index is 43.16 kg/m. -low calorie diet and portion control discussed with patient   DVT prophylaxis:SCDs Code Status:Full code Family Communication:No family member at bedside. Disposition:  Status is: Inpatient  Dispo: The patient is from: Home Anticipated d/c is to: To be determined; hopefully home. Anticipated d/c date is: To be determined (needs another 25-30 pounds weight loss prior to discharge) Patient currently no medically stable for discharge; still  with  significant fluid overload findings, complaining of shortness of breath with exertion and having orthopnea. Continue IV diuresis likely for several more days.  Consultants:  Cardiology service  GI service   Procedures: See below for x-ray reports  Antimicrobials:  None  Subjective: Very little shortness of breath with exertion and mild orthopnea reported.  No fever, no chest pain, no nausea vomiting.  Good urine output reported.  Still with signs of fluid overload/anasarca, but mostly improved.  Objective: Vitals:   06/29/20 1338 06/29/20 2155 06/30/20 0414 06/30/20 0443  BP: (!) 91/53 (!) 109/53 (!) 94/56   Pulse: 62 65 62   Resp: 16 16 17    Temp: 98.2 F (36.8 C) 98.3 F (36.8 C) 98.2 F (36.8 C)   TempSrc: Oral Oral Oral   SpO2: 97% 97% 98%   Weight:    127.3 kg  Height:        Intake/Output Summary (Last 24 hours) at 06/30/2020 1438 Last data filed at 06/29/2020 2157 Gross per 24 hour  Intake 240 ml  Output 1400 ml  Net -1160 ml   Filed Weights   06/29/20 0344 06/29/20 0558 06/30/20 0443  Weight: 130.1 kg 124.5 kg 127.3 kg    Examination: General exam: Alert, awake, oriented x 3; feeling much better, denying chest pain, no nausea, no vomiting, no abdominal pain.  Good urine output reported.  Patient expressed very little orthopnea and just mild shortness of breath on exertion.  No fever Respiratory system: Good air movement bilaterally, decreased breath sounds at the bases, no frank crackles, no wheezing.  No using accessory muscle.  Good O2 sat on room air. Cardiovascular system:RRR. No murmurs, rubs or gallops.  Unable to assess JVD with body habitus. Gastrointestinal system: Abdomen is obese, nontender, positive bowel sounds; improvement in his abdominal girth appreciated; no guarding.   Central nervous system: Alert and oriented. No focal neurological deficits. Extremities: No cyanosis or clubbing; 1+ edema bilaterally. Skin: No petechiae.  No  rashes. Psychiatry: Judgement and insight appear normal. Mood & affect appropriate.   Data Reviewed: I have personally reviewed following labs and imaging studies  CBC: Recent Labs  Lab 06/25/20 0605 06/26/20 0555 06/27/20 0634 06/30/20 0838  WBC 3.4* 3.5* 3.2* 4.1  NEUTROABS 2.0  --   --   --   HGB 8.5* 9.0* 9.5* 10.2*  HCT 26.7* 27.7* 28.8* 30.8*  MCV 108.1* 106.9* 105.5* 103.4*  PLT 49* 55* 57* 63*   Basic Metabolic Panel: Recent Labs  Lab 06/24/20 0558 06/25/20 0605 06/26/20 0555 06/27/20 0634 06/30/20 0838  NA 140 139 139 145 136  K 4.1 3.8 3.8 3.6 3.3*  CL 107 104 105 95* 96*  CO2 23 26 24 28 29   GLUCOSE 76 110* 91 105* 98  BUN 57* 55* 53* 47* 38*  CREATININE 1.75* 1.76* 1.65* 1.59* 1.65*  CALCIUM 9.2 9.3 9.0 10.1 9.0  MG  --   --  1.9  --  1.9   GFR: Estimated Creatinine Clearance: 64.2 mL/min (A) (by C-G formula based on SCr of 1.65 mg/dL (H)).   Liver Function Tests: Recent Labs  Lab 06/25/20 0605 06/26/20 0555 06/27/20 0634  AST 17 18 21   ALT 9 8 9   ALKPHOS 32* 35* 36*  BILITOT 1.9* 1.8* 1.8*  PROT 7.0 7.0 6.8  ALBUMIN 4.7 4.5 4.3   Coagulation Profile: Recent Labs  Lab 06/25/20 0605 06/26/20 0555  INR 1.7* 1.6*   CBG: Recent Labs  Lab 06/29/20 1123 06/29/20 1611 06/29/20 2153  06/30/20 0722 06/30/20 1121  GLUCAP 168* 173* 164* 96 178*    Recent Results (from the past 240 hour(s))  SARS Coronavirus 2 by RT PCR (hospital order, performed in [facility] hospital lab) Nasopharyngeal Nasopharyngeal Swab     Status: None   Collection Time: 06/20/20  5:30 PM   Specimen: Nasopharyngeal Swab  Result Value Ref Range Status   SARS Coronavirus 2 NEGATIVE NEGATIVE Final    Comment: (NOTE) SARS-CoV-2 target nucleic acids are NOT DETECTED.  The SARS-CoV-2 RNA is generally detectable in upper and lower respiratory specimens during the acute phase of infection. The lowest concentration of SARS-CoV-2 viral copies this assay can detect is  250 copies / mL. A negative result does not preclude SARS-CoV-2 infection and should not be used as the sole basis for treatment or other patient management decisions.  A negative result may occur with improper specimen collection / handling, submission of specimen other than nasopharyngeal swab, presence of viral mutation(s) within the areas targeted by this assay, and inadequate number of viral copies (<250 copies / mL). A negative result must be combined with clinical observations, patient history, and epidemiological information.  Fact Sheet for Patients:   StrictlyIdeas.no  Fact Sheet for Healthcare Providers: BankingDealers.co.za  This test is not yet approved or  cleared by the [place] FDA and has been authorized for detection and/or diagnosis of SARS-CoV-2 by FDA under an Emergency Use Authorization (EUA).  This EUA will remain in effect (meaning this test can be used) for the duration of the COVID-19 declaration under Section 564(b)(1) of the Act, 21 U.S.C. section 360bbb-3(b)(1), unless the authorization is terminated or revoked sooner.  Performed at [facility], [address], [place],  [address]      Radiology Studies: No results found.   Scheduled Meds: . aspirin EC  81 mg Oral QHS  . cholecalciferol  5,000 Units Oral q AM  . ferrous sulfate  325 mg Oral q AM  . furosemide  80 mg Intravenous TID  . insulin aspart  0-15 Units Subcutaneous TID WC  . insulin aspart  0-5 Units Subcutaneous QHS  . insulin detemir  15 Units Subcutaneous QHS  . insulin detemir  30 Units Subcutaneous Daily  . lactulose  20 g Oral Daily  . loratadine  10 mg Oral q AM  . nadolol  20 mg Oral q AM  . pantoprazole  40 mg Oral Daily  . potassium chloride  20 mEq Oral Once  . pravastatin  20 mg Oral QHS  . spironolactone  100 mg Oral Daily    LOS: 10 days    Time spent: 30 minutes    Barton Dubois, MD [facility]  If 7PM-7AM, please contact night-coverage [email or web address] 06/30/2020, 2:38 PM

## 2020-07-01 DIAGNOSIS — G4733 Obstructive sleep apnea (adult) (pediatric): Secondary | ICD-10-CM | POA: Diagnosis not present

## 2020-07-01 DIAGNOSIS — Z9989 Dependence on other enabling machines and devices: Secondary | ICD-10-CM | POA: Diagnosis not present

## 2020-07-01 DIAGNOSIS — I10 Essential (primary) hypertension: Secondary | ICD-10-CM | POA: Diagnosis not present

## 2020-07-01 DIAGNOSIS — K7460 Unspecified cirrhosis of liver: Secondary | ICD-10-CM | POA: Diagnosis not present

## 2020-07-01 DIAGNOSIS — R601 Generalized edema: Secondary | ICD-10-CM | POA: Diagnosis not present

## 2020-07-01 DIAGNOSIS — E1165 Type 2 diabetes mellitus with hyperglycemia: Secondary | ICD-10-CM | POA: Diagnosis not present

## 2020-07-01 DIAGNOSIS — I5033 Acute on chronic diastolic (congestive) heart failure: Secondary | ICD-10-CM | POA: Diagnosis not present

## 2020-07-01 LAB — BASIC METABOLIC PANEL
Anion gap: 14 (ref 5–15)
BUN: 38 mg/dL — ABNORMAL HIGH (ref 6–20)
CO2: 29 mmol/L (ref 22–32)
Calcium: 9.5 mg/dL (ref 8.9–10.3)
Chloride: 95 mmol/L — ABNORMAL LOW (ref 98–111)
Creatinine, Ser: 1.56 mg/dL — ABNORMAL HIGH (ref 0.61–1.24)
GFR calc Af Amer: 55 mL/min — ABNORMAL LOW (ref >60)
GFR calc non Af Amer: 48 mL/min — ABNORMAL LOW (ref >60)
Glucose, Bld: 101 mg/dL — ABNORMAL HIGH (ref 70–99)
Potassium: 3.4 mmol/L — ABNORMAL LOW (ref 3.5–5.1)
Sodium: 138 mmol/L (ref 135–145)

## 2020-07-01 LAB — GLUCOSE, CAPILLARY
Glucose-Capillary: 93 mg/dL (ref 70–99)
Glucose-Capillary: 244 mg/dL — ABNORMAL HIGH (ref 70–99)
Glucose-Capillary: 125 mg/dL — ABNORMAL HIGH (ref 70–99)

## 2020-07-01 MED ORDER — NYSTATIN 100000 UNIT/GM EX POWD
Freq: Three times a day (TID) | CUTANEOUS | Status: DC
  Filled 2020-07-01: qty 15

## 2020-07-01 MED ORDER — SPIRONOLACTONE 100 MG PO TABS: 100.0000 mg | ORAL_TABLET | Freq: Every day | ORAL | 2 refills | Status: DC

## 2020-07-01 MED ORDER — POTASSIUM CHLORIDE CRYS ER 20 MEQ PO TBCR: 40.0000 meq | EXTENDED_RELEASE_TABLET | Freq: Every day | ORAL | 1 refills | Status: DC

## 2020-07-01 MED ORDER — ARMODAFINIL 200 MG PO TABS: 200.0000 mg | ORAL_TABLET | Freq: Every day | ORAL | Status: DC | PRN

## 2020-07-01 MED ORDER — TORSEMIDE 20 MG PO TABS
60.0000 mg | ORAL_TABLET | Freq: Two times a day (BID) | ORAL | Status: DC
  Administered 2020-07-01 (×2): 60 mg via ORAL
  Filled 2020-07-01 (×2): qty 3

## 2020-07-01 MED ORDER — POTASSIUM CHLORIDE CRYS ER 20 MEQ PO TBCR
40.0000 meq | EXTENDED_RELEASE_TABLET | Freq: Four times a day (QID) | ORAL | Status: AC
  Administered 2020-07-01 (×2): 40 meq via ORAL
  Filled 2020-07-01 (×2): qty 2

## 2020-07-01 MED ORDER — LACTULOSE 10 GM/15ML PO SOLN: 20.0000 g | Freq: Every day | ORAL | 0 refills | Status: DC

## 2020-07-01 MED ORDER — TRAMADOL-ACETAMINOPHEN 37.5-325 MG PO TABS: 1.0000 | ORAL_TABLET | ORAL | Status: DC | PRN

## 2020-07-01 MED ORDER — POTASSIUM CHLORIDE CRYS ER 20 MEQ PO TBCR: 40.0000 meq | EXTENDED_RELEASE_TABLET | Freq: Every day | ORAL | Status: DC

## 2020-07-01 MED ORDER — TORSEMIDE 20 MG PO TABS: 60.0000 mg | ORAL_TABLET | Freq: Two times a day (BID) | ORAL | 2 refills | Status: DC

## 2020-07-01 NOTE — Discharge Summary (Signed)
Physician Discharge Summary  [name]:[id number] DOB: [birth year] DOA: 06/20/2020  PCP: [name], MD  Admit date: 06/20/2020 Discharge date: 07/01/2020  Time spent: 35 minutes  Recommendations for Outpatient Follow-up:  1. Repeat basic metabolic panel to follow across renal function 2. Reassess patient volume status and further adjust diuretic therapy as needed.   Discharge Diagnoses:  Active Problems:   Diabetes mellitus type 2, uncontrolled (HCC)   Hypertension   CAD (coronary artery disease)   Obesity, Class III, BMI 40-49.9 (morbid obesity) (HCC)   OSA on CPAP   Anemia   Liver failure (HCC)   Cirrhosis of liver (HCC)   Acute on chronic heart failure with preserved ejection fraction ([place])   Anasarca   Discharge Condition: Stable and improved.  Patient discharged home with instruction to follow-up with PCP, cardiology service and gastroenterology as an outpatient.  CODE STATUS: full code.  Diet recommendation: Modified carbohydrate, low sodium, low calorie diet.  Filed Weights   06/29/20 0558 06/30/20 0443 07/01/20 0528  Weight: 124.5 kg 127.3 kg 123.8 kg    History of present illness:  As per H&P written by Dr. Waldron Labs on 06/20/2020 59 y.o.male,past medical history of chronic diastolic CHF, who does report worsening overload status, was recently seen by cardiology in June, where they have increased his Lasix to 80 mg oral daily, despite that he continues to have worsening volume overload, and increased weight gain, worsening lower extremity edema up to the thigh, scrotal edema, increased abdominal girth as well, does report some exertional dyspnea as well, he does report some cough and congestion recently started on p.o. antibiotics by PCP which he did not start taking it, he denies any chest pain, but he does report dyspnea, mainly exertional, he was seen today by cardiology for a follow-up, where his creatinine was noted to have increased to 2.08, he was  significantly volume overloaded, and referred to the hospital for admission and IV diuresis.  Hospital Course:  1-fluid overload/anasarca: Multifactorial in the setting of cirrhosis and acute on chronic diastolic heart failure -Patient encouraged to follow adequate hydration no more than 2 L of fluids daily. -Patient will be discharged on Demadex 60 mg twice a day and Aldactone 100 mg daily. -Continue to follow daily weight and  low-sodium diet. -Appreciate assistance and recommendation by cardiology service; patient will follow up in 1 to 2 weeks after discharge with pain. -Continue to follow electrolytes and renal function closely after discharge. -weight down to 123.8 kg at discharge; over 30 L removed since admission.  2-Diabetes mellitus type 2, with nephropathy and transient uncontrolled hyperglycemia -improved. -CBG in the 240's range on presentation. -resume home hypoglycemic regimen.  -Follow CBGs and further adjust hypoglycemic regimen as required. -A1C 6.8  3-acute on chronic stage IIIa chronic kidney disease -In the setting of heart failure exacerbation and concern for hepatorenal syndrome component. -Good response to the use of IV diuresis, Aldactone and albumin. -Repeat basic metabolic panel at follow-up visit to reassess electrolytes and renal function. -Continue to minimize the use of nephrotoxic agents. -At discharge creatinine  back to baseline and 1.56  4-CAD (coronary artery disease) -No chest pain currently. -Continue the use of beta-blocker, aspirin and statins. -Continue outpatient follow-up with cardiology service.  5-OSA on CPAP -Continue CPAP nightly. -Weight loss encouraged.  6-pancytopenia -In the setting of cirrhosis and anemia of chronic kidney disease -Continue patient follow-up with Dr. Delton Coombes for pancytopenia purposes and further work-up. -Continue GI follow-up and referral to hepatology clinic  at [facility]. -No signs of overt  bleeding -Continue the use of nadolol and PPI.   7-NASH cirrhosis -Because of his cirrhosis is NASH. -Continue Lasix, Aldactone, nadolol and PPI -per GI rec's started on low dose lactulose. -Continue outpatient follow-up with gastroenterology service; plan is to pursuit follow up at [facility] hepatology clinic.   8-morbid obesity -Body mass index was 43.16 kg/m on admission and 38.61 at discharge -low calorie diet, increase physical activity and portion control discussed with patient.   Procedures: See below for x-ray report  Consultations:  Cardiology service  Gastroenterology  Discharge Exam: Vitals:   06/30/20 2310 07/01/20 0528  BP:  103/65  Pulse: 68 60  Resp: 16 20  Temp:  98.8 F (37.1 C)  SpO2: 96% 96%    General: Afebrile, no chest pain, no nausea, no vomiting.  Patient is speaking in full sentences and reporting no orthopnea currently.  Very little shortness of breath with activity has been expressed, but feeling ready to go home and continue medical treatment as an outpatient. Cardiovascular: S1 and S2, no rubs, no gallops, unable to properly assess for JVD due to body habitus.  No murmurs heard. Respiratory: Improved air movement bilaterally; no frank wheezing or crackles appreciated; no using accessory muscle.  Good oxygen saturation on room air. Abdomen: Obese, soft, nontender, distended, positive bowel sounds.  Significant improvement in his abdominal girth appreciated at time of discharge. Extremities: trace to 1+ edema appreciated on both legs.  Discharge Instructions   Discharge Instructions    (HEART FAILURE PATIENTS) Call MD:  Anytime you have any of the following symptoms: 1) 3 pound weight gain in 24 hours or 5 pounds in 1 week 2) shortness of breath, with or without a dry hacking cough 3) swelling in the hands, feet or stomach 4) if you have to sleep on extra pillows at night in order to breathe.   Complete by: As directed    Diet - low sodium heart  healthy   Complete by: As directed    Discharge instructions   Complete by: As directed    Take medications as prescribed Follow low-sodium diet (less than 2 g of sodium in 24 hours) Maintain adequate hydration (no more than 2 L of fluids in 24 hours) Arrange follow-up with PCP in 10 days Follow-up with cardiology service as instructed (office will contact you with appointment details). Check your weight on daily basis.     Allergies as of 07/01/2020   No Known Allergies     Medication List    STOP taking these medications   b complex vitamins tablet   ESTER C PO   furosemide 20 MG tablet Commonly known as: LASIX   hydrochlorothiazide 25 MG tablet Commonly known as: HYDRODIURIL   MULTIVITAMIN ADULT PO   olmesartan 40 MG tablet Commonly known as: BENICAR     TAKE these medications   albuterol 108 (90 Base) MCG/ACT inhaler Commonly known as: VENTOLIN HFA Inhale 2 puffs into the lungs every 6 (six) hours as needed for wheezing or shortness of breath.   Armodafinil 200 MG Tabs Take 200 mg by mouth daily as needed (for sleepiness).   aspirin EC 81 MG tablet Take 81 mg by mouth at bedtime.   Dialyvite Vitamin D 5000 125 MCG (5000 UT) capsule Generic drug: Cholecalciferol Take 5,000 Units by mouth in the morning.   ELDERBERRY PO Take 1 capsule by mouth at bedtime. With  Vitamin c and zinc   ferrous sulfate 325 (65  FE) MG tablet Take 325 mg by mouth in the morning.   FLAXSEED OIL PO Take 1,400 mg by mouth at bedtime.   insulin aspart 100 UNIT/ML injection Commonly known as: novoLOG Inject 5-30 Units into the skin 3 (three) times daily before meals.   lactulose 10 GM/15ML solution Commonly known as: CHRONULAC Take 30 mLs (20 g total) by mouth daily. Start taking on: July 02, 2020   loratadine 10 MG tablet Commonly known as: CLARITIN Take 10 mg by mouth in the morning.   Magnesium 250 MG Tabs Take 250 mg by mouth daily.   Medical Compression Stockings  Misc 1 each by Does not apply route as directed. Low pressure knee high compression stockings Dx: leg swelling   nadolol 20 MG tablet Commonly known as: CORGARD TAKE 1 TABLET(20 MG) BY MOUTH DAILY What changed: See the new instructions.   omeprazole 40 MG capsule Commonly known as: PRILOSEC Take 40 mg by mouth at bedtime.   potassium chloride SA 20 MEQ tablet Commonly known as: KLOR-CON Take 2 tablets (40 mEq total) by mouth daily. Start taking on: July 02, 2020   pravastatin 20 MG tablet Commonly known as: PRAVACHOL TAKE 1 TABLET(20 MG) BY MOUTH AT BEDTIME What changed:   how much to take  how to take this  when to take this   spironolactone 100 MG tablet Commonly known as: ALDACTONE Take 1 tablet (100 mg total) by mouth daily. Start taking on: July 02, 2020   torsemide 20 MG tablet Commonly known as: DEMADEX Take 3 tablets (60 mg total) by mouth 2 (two) times daily.   traMADol-acetaminophen 37.5-325 MG tablet Commonly known as: ULTRACET Take 1 tablet by mouth every 4 (four) hours as needed for severe pain. What changed: reasons to take this   Tresiba FlexTouch 200 UNIT/ML FlexTouch Pen Generic drug: insulin degludec Inject 40-70 Units into the skin See admin instructions. Inject 70 units into the skin in the morning and 40 units at bedtime   TURMERIC CURCUMIN PO Take 2,000 mg by mouth at bedtime.      No Known Allergies  Follow-up Information    [facility] Follow up.   Specialty: Home Health Services Why:  RN/PT Contact information: [place] [address] [phone]        Erma Heritage, PA-C Follow up on 07/11/2020.   Specialties: Physician Assistant, Cardiology Why: Cardiology Hospital Follow-up on 07/11/2020 at 1:00 PM.  Contact information: [place] [address] [phone]        [name], MD. Schedule an appointment as soon as possible for a visit in 10 day(s).   Specialty: Family  Medicine Contact information: [address] [place] [address] [phone]        Satira Sark, MD .   Specialty: Cardiology Contact information: [place] [address] [phone]               The results of significant diagnostics from this hospitalization (including imaging, microbiology, ancillary and laboratory) are listed below for reference.    Significant Diagnostic Studies: US Abdomen Complete  Result Date: 06/21/2020 CLINICAL DATA:  Morbid obesity, hepatic cirrhosis EXAM: ABDOMEN ULTRASOUND COMPLETE COMPARISON:  Prior abdominal ultrasound 01/15/2020 FINDINGS: Gallbladder: Multiple mobile echogenic foci within the gallbladder lumen consistent with cholelithiasis. The gallbladder wall is thickened at 4.6 mm. Per the sonographer, the sonographic Percell Miller sign was negative. Common bile duct: Diameter: Normal at 4 mm. Liver:  Nodular hepatic contour. The hepatic parenchyma is diffusely heterogeneous with coarsening of the echotexture. No discrete hepatic lesion is identified. Small volume perihepatic ascites. Portal vein is patent on color Doppler imaging with normal direction of blood flow towards the liver. IVC: No abnormality visualized. Pancreas: Visualized portion unremarkable. Spleen: Splenomegaly. The spleen measures approximately 768 cubic cm in volume. There is small volume perisplenic ascites as well. Right Kidney: Length: 10.5 cm. Echogenicity within normal limits. No mass or hydronephrosis visualized. Left Kidney: Length: 11.7 cm. Echogenicity within normal limits. No mass or hydronephrosis visualized. Abdominal aorta: No evidence of aneurysm. Other findings: None. IMPRESSION: 1. Hepatic cirrhosis with evidence of portal hypertension (splenomegaly and mild ascites). 2. No discrete hepatic lesion identified. 3. Cholelithiasis. 4. Gallbladder wall thickening is nonspecific in the setting of cirrhosis and can be related to the underlying liver  pathology and/or hypoalbuminemia. Acute cholecystitis is not suspected. Electronically Signed   By: Jacqulynn Cadet M.D.   On: 06/21/2020 13:43   DG Chest Port 1 View  Result Date: 06/20/2020 CLINICAL DATA:  History of CHF with reported fluid overload EXAM: PORTABLE CHEST 1 VIEW COMPARISON:  Radiograph 02/14/2020, 02/01/2020 FINDINGS: Patient rotated in a left anterior obliquity. Postsurgical changes from prior sternotomy and CABG. Slight prominence of the cardiomediastinal silhouette compared to prior is likely related to projectional differences and portable technique. Calcified aorta. No focal consolidative opacity. Central vascular congestion but without convincing features of frank edema. No pneumothorax or visible effusion. No acute osseous or soft tissue abnormality. Degenerative changes are present in the imaged spine and shoulders. IMPRESSION: Central vascular congestion without frank edema or effusion at this time. Electronically Signed   By: Lovena Le M.D.   On: 06/20/2020 20:04    Microbiology: No results found for this or any previous visit (from the past 240 hour(s)).   Labs: Basic Metabolic Panel: Recent Labs  Lab 06/25/20 0605 06/26/20 0555 06/27/20 0634 06/30/20 0838 07/01/20 0424  NA 139 139 145 136 138  K 3.8 3.8 3.6 3.3* 3.4*  CL 104 105 95* 96* 95*  CO2 26 24 28 29 29   GLUCOSE 110* 91 105* 98 101*  BUN 55* 53* 47* 38* 38*  CREATININE 1.76* 1.65* 1.59* 1.65* 1.56*  CALCIUM 9.3 9.0 10.1 9.0 9.5  MG  --  1.9  --  1.9  --    Liver Function Tests: Recent Labs  Lab 06/25/20 0605 06/26/20 0555 06/27/20 0634  AST 17 18 21   ALT 9 8 9   ALKPHOS 32* 35* 36*  BILITOT 1.9* 1.8* 1.8*  PROT 7.0 7.0 6.8  ALBUMIN 4.7 4.5 4.3   CBC: Recent Labs  Lab 06/25/20 0605 06/26/20 0555 06/27/20 0634 06/30/20 0838  WBC 3.4* 3.5* 3.2* 4.1  NEUTROABS 2.0  --   --   --   HGB 8.5* 9.0* 9.5* 10.2*  HCT 26.7* 27.7* 28.8* 30.8*  MCV 108.1* 106.9* 105.5* 103.4*  PLT 49* 55*  57* 63*    BNP (last 3 results) Recent Labs    06/20/20 1800  BNP 601.0*   CBG: Recent Labs  Lab 06/30/20 1121 06/30/20 1712 06/30/20 2052 07/01/20 0734 07/01/20 1112  GLUCAP 178* 101* 183* 93 244*   Signed:  Barton Dubois MD.  [facility] 07/01/2020, 3:42 PM

## 2020-07-01 NOTE — TOC Transition Note (Signed)
Transition of Care [facility]) - CM/SW Discharge Note   Patient Details  Name: JEDREK DINOVO MRN: [id number] Date of Birth: Apr 05, 1961  Transition of Care [facility]) CM/SW Contact:  Boneta Lucks, RN Phone Number: 07/01/2020, 10:13 AM   Clinical Narrative:   Patient medically ready to discharge home today with home health. Tim with [facility] accepted the referral for PT/RN. Added to AVS.     Final next level of care: [place] Barriers to Discharge: Barriers Resolved   Patient Goals and CMS Choice Patient states their goals for this hospitalization and ongoing recovery are:: Discharge home with [facility] CMS Medicare.gov Compare Post Acute Care list provided to:: Patient Choice offered to / list presented to : Patient  Discharge Placement         Patient and family notified of of transfer: 07/01/20  Discharge Plan and Services In-house Referral: Clinical Social Work Discharge Planning Services: NA Post Acute Care Choice: Home Health          DME Arranged: N/A DME Agency: NA       HH Arranged: PT [place] Agency: [place] (now [facility]) Date [place]: 07/01/20 Time [place]: [id number] Representative spoke with at [place]: [employer]

## 2020-07-01 NOTE — Progress Notes (Signed)
Progress Note  Patient Name: [name] Date of Encounter: 07/01/2020  [facility] Cardiologist: Rozann Lesches, MD   Subjective   No complaints  Inpatient Medications    Scheduled Meds: . aspirin EC  81 mg Oral QHS  . cholecalciferol  5,000 Units Oral q AM  . ferrous sulfate  325 mg Oral q AM  . insulin aspart  0-15 Units Subcutaneous TID WC  . insulin aspart  0-5 Units Subcutaneous QHS  . insulin detemir  15 Units Subcutaneous QHS  . insulin detemir  30 Units Subcutaneous Daily  . lactulose  20 g Oral Daily  . loratadine  10 mg Oral q AM  . nadolol  20 mg Oral q AM  . nystatin   Topical TID  . pantoprazole  40 mg Oral Daily  . potassium chloride  20 mEq Oral Once  . pravastatin  20 mg Oral QHS  . spironolactone  100 mg Oral Daily   Continuous Infusions:  PRN Meds: albuterol, polyvinyl alcohol, traMADol   Vital Signs    Vitals:   06/30/20 1504 06/30/20 2115 06/30/20 2310 07/01/20 0528  BP: 101/66 (!) 99/59  103/65  Pulse: (!) 58 (!) 58 68 60  Resp: 18 20 16 20   Temp: 98.7 F (37.1 C) 98 F (36.7 C)  98.8 F (37.1 C)  TempSrc: Oral Oral  Oral  SpO2: 96% 98% 96% 96%  Weight:    123.8 kg  Height:        Intake/Output Summary (Last 24 hours) at 07/01/2020 0814 Last data filed at 07/01/2020 0500 Gross per 24 hour  Intake 720 ml  Output 3550 ml  Net -2830 ml   Last 3 Weights 07/01/2020 06/30/2020 06/29/2020  Weight (lbs) 272 lb 14.9 oz 280 lb 10.3 oz 274 lb 8 oz  Weight (kg) 123.8 kg 127.3 kg 124.512 kg      Telemetry    SR - Personally Reviewed  ECG    n/a - Personally Reviewed  Physical Exam   GEN: No acute distress.   Neck: No JVD Cardiac: RRR, no murmurs, rubs, or gallops.  Respiratory: faint crackles bilateral bases GI: Soft, nontender, non-distended  MS: No edema; No deformity. Neuro:  Nonfocal  Psych: Normal affect   Labs    High Sensitivity Troponin:  No results for input(s): TROPONINIHS in the last 720 hours.     Chemistry Recent Labs  Lab 06/25/20 0605 06/25/20 0605 06/26/20 0555 06/26/20 0555 06/27/20 0634 06/30/20 0838 07/01/20 0424  NA 139   < > 139   < > 145 136 138  K 3.8   < > 3.8   < > 3.6 3.3* 3.4*  CL 104   < > 105   < > 95* 96* 95*  CO2 26   < > 24   < > 28 29 29   GLUCOSE 110*   < > 91   < > 105* 98 101*  BUN 55*   < > 53*   < > 47* 38* 38*  CREATININE 1.76*   < > 1.65*   < > 1.59* 1.65* 1.56*  CALCIUM 9.3   < > 9.0   < > 10.1 9.0 9.5  PROT 7.0  --  7.0  --  6.8  --   --   ALBUMIN 4.7  --  4.5  --  4.3  --   --   AST 17  --  18  --  21  --   --  ALT 9  --  8  --  9  --   --   ALKPHOS 32*  --  35*  --  36*  --   --   BILITOT 1.9*  --  1.8*  --  1.8*  --   --   GFRNONAA 41*   < > 44*   < > 46* 44* 48*  GFRAA 48*   < > 52*   < > 54* 52* 55*  ANIONGAP 9   < > 10  --   --  11 14   < > = values in this interval not displayed.     Hematology Recent Labs  Lab 06/26/20 0555 06/27/20 0634 06/30/20 0838  WBC 3.5* 3.2* 4.1  RBC 2.59* 2.73* 2.98*  HGB 9.0* 9.5* 10.2*  HCT 27.7* 28.8* 30.8*  MCV 106.9* 105.5* 103.4*  MCH 34.7* 34.8* 34.2*  MCHC 32.5 33.0 33.1  RDW 13.9 14.2 13.8  PLT 55* 57* 63*    BNPNo results for input(s): BNP, PROBNP in the last 168 hours.   DDimer No results for input(s): DDIMER in the last 168 hours.   Radiology    No results found.  Cardiac Studies     Patient Profile     59 y.o.malew/ PMH ofCAD (s/p CABG in 2016), HTN, HLD, Type 2 DM, Stage 3 CKD, OSA, thrombocytopenia and polyclonal gammopathy who was admitted directly from the office on 06/20/2020 for an acute CHF exacerbation as weight had increased by 60 lbs since 03/2020.  Assessment & Plan    1. Acute on chronic diastolic HF, volume status also complicated by cirrhosis - presented with anasarca, massive volume overload - neg 2.8 L yesterday, neg 14.3 L since admission. He is on IV lasix 80mg  tid increased just yesterday.Mild fluctuations in renal function.   - nearing  euvolemia, we will d/c IV lasix. Start torsemide 60mg  bid.  2. CAD -s/p CABG in 2016 and he denies any recent anginal symptoms. Recent echo in 05/2020 showed a preserved EF with no regional WMA. Continue ASA, BB and statin therapy.  3. Cirrhosis/NASH - per primary team and GI  4. Hypokalemia - wrote for 54mEq x 2 today - would discharge on oral 67mEq daily  Ok for discharge from cardiac standpoint, we will arange f/u 1-2 weeks. At f/u will need BMET/Mg.   For questions or updates, please contact [place] Please consult [email or web address] for contact info under        Signed, Carlyle Dolly, MD  07/01/2020, 8:14 AM

## 2020-07-01 NOTE — Progress Notes (Signed)
Physical Therapy Treatment Patient Details Name: [name] MRN: [id number] DOB: 01-31-1961 Today's Date: 07/01/2020    History of Present Illness [name]  is a 59 y.o. male, past medical history of chronic diastolic CHF, who does report worsening overload status, was recently seen by cardiology in June, where they have increased his Lasix to 80 mg oral daily, despite that he continues to have worsening volume overload, and increased weight gain, worsening lower extremity edema up to the thigh, scrotal edema, increased abdominal girth as well, does report some exertional dyspnea as well, he does report some cough and congestion recently started on p.o. antibiotics by PCP which he did not start taking it, he denies any chest pain, but he does report dyspnea, mainly exertional, he was seen today by cardiology for a follow-up, where his creatinine was noted to have increased to 2.08, he was significantly volume overloaded, where he gained 60 pounds since April, as well patient with abdominal ultrasound this February which suggesting cirrhosis, slightly nodular contour, as well he was noted to have thrombocytopenia, will you is following with oncology Dr. Delton Coombes regarding that, direct admission was requested by cardiology for IV diuresis.    PT Comments    Pt motivated to participate today, reports he is going home later today. Pt tolerates increase in reps with seated therapeutic exercises and increased ambulation distance using RW for steadying and to decrease weight-bearing through knees. Pt without complaints at EOS. Patient will benefit from continued physical therapy in hospital and recommendations below to increase strength, balance, endurance for safe ADLs and gait.    Follow Up Recommendations  Home health PT;Supervision for mobility/OOB;Supervision - Intermittent     Equipment Recommendations  None recommended by PT    Recommendations for Other Services        Precautions / Restrictions Precautions Precautions: Fall Restrictions Weight Bearing Restrictions: No    Mobility  Bed Mobility Overal bed mobility: Modified Independent Bed Mobility: Supine to Sit  General bed mobility comments: increased time with HOB elevated  Transfers Overall transfer level: Modified independent Equipment used: Rolling walker (2 wheeled) Transfers: Sit to/from [facility] Sit to Stand: Modified independent (Device/Increase time) Stand pivot transfers: Modified independent (Device/Increase time)    General transfer comment: BUE assisting to power up, good steadiness upon standing, increased time  Ambulation/Gait Ambulation/Gait assistance: Supervision Gait Distance (Feet): 100 Feet Assistive device: Rolling walker (2 wheeled) Gait Pattern/deviations: Decreased step length - right;Decreased step length - left;Decreased stride length Gait velocity: decreased   General Gait Details: slow steps with increased lateral weight-shifting and minimal bil foot clearance, dependent on RW for steadying and to decrease bil knee weight-bearing for comfort, no loss of balance or unsteadiness noted   Stairs             Wheelchair Mobility    Modified Rankin (Stroke Patients Only)       Balance Overall balance assessment: Needs assistance Sitting-balance support: Feet supported;No upper extremity supported Sitting balance-Leahy Scale: Good Sitting balance - Comments: seated EOB   Standing balance support: During functional activity;Bilateral upper extremity supported Standing balance-Leahy Scale: Fair Standing balance comment: with RW       Cognition Arousal/Alertness: Awake/alert Behavior During Therapy: WFL for tasks assessed/performed Overall Cognitive Status: Within Functional Limits for tasks assessed           Exercises General Exercises - Lower Extremity Long Arc Quad: Seated;AROM;Strengthening;Both;15 reps Hip  Flexion/Marching: Seated;AROM;Strengthening;Both;15 reps Toe Raises: Seated;AROM;Strengthening;Both;15 reps Heel Raises: Seated;AROM;Strengthening;Both;15 reps  General Comments        Pertinent Vitals/Pain Pain Assessment: Faces ("very little in L knee") Faces Pain Scale: Hurts a little bit Pain Location: L knee Pain Descriptors / Indicators: Sore Pain Intervention(s): Limited activity within patient's tolerance;Monitored during session    Home Living                      Prior Function            PT Goals (current goals can now be found in the care plan section) Acute Rehab PT Goals Patient Stated Goal: return home with family to assist PT Goal Formulation: With patient Time For Goal Achievement: 07/04/20 Potential to Achieve Goals: Good Progress towards PT goals: Progressing toward goals    Frequency    Min 3X/week      PT Plan Current plan remains appropriate    Co-evaluation              AM-PAC PT "6 Clicks" Mobility   Outcome Measure  Help needed turning from your back to your side while in a flat bed without using bedrails?: None Help needed moving from lying on your back to sitting on the side of a flat bed without using bedrails?: None Help needed moving to and from a bed to a chair (including a wheelchair)?: None Help needed standing up from a chair using your arms (e.g., wheelchair or bedside chair)?: A Little Help needed to walk in hospital room?: A Little Help needed climbing 3-5 steps with a railing? : A Little 6 Click Score: 21    End of Session   Activity Tolerance: Patient tolerated treatment well Patient left: in chair;with call bell/phone within reach Nurse Communication: Mobility status PT Visit Diagnosis: Other abnormalities of gait and mobility (R26.89);Unsteadiness on feet (R26.81);Muscle weakness (generalized) (M62.81)     Time: 3343-5686 PT Time Calculation (min) (ACUTE ONLY): 23 min  Charges:  $Gait Training:  8-22 mins $Therapeutic Exercise: 8-22 mins                      Tori Amika Tassin PT, DPT 07/01/20, 10:47 AM [phone]

## 2020-07-01 NOTE — Plan of Care (Signed)

## 2020-07-07 ENCOUNTER — Other Ambulatory Visit: Payer: Self-pay | Admitting: Cardiology

## 2020-07-07 DIAGNOSIS — I5033 Acute on chronic diastolic (congestive) heart failure: Secondary | ICD-10-CM | POA: Diagnosis not present

## 2020-07-07 DIAGNOSIS — N179 Acute kidney failure, unspecified: Secondary | ICD-10-CM | POA: Diagnosis not present

## 2020-07-07 DIAGNOSIS — I2510 Atherosclerotic heart disease of native coronary artery without angina pectoris: Secondary | ICD-10-CM | POA: Diagnosis not present

## 2020-07-07 DIAGNOSIS — K7290 Hepatic failure, unspecified without coma: Secondary | ICD-10-CM | POA: Diagnosis not present

## 2020-07-07 DIAGNOSIS — K7460 Unspecified cirrhosis of liver: Secondary | ICD-10-CM | POA: Diagnosis not present

## 2020-07-07 DIAGNOSIS — M1991 Primary osteoarthritis, unspecified site: Secondary | ICD-10-CM | POA: Diagnosis not present

## 2020-07-07 DIAGNOSIS — E785 Hyperlipidemia, unspecified: Secondary | ICD-10-CM | POA: Diagnosis not present

## 2020-07-07 DIAGNOSIS — E1122 Type 2 diabetes mellitus with diabetic chronic kidney disease: Secondary | ICD-10-CM | POA: Diagnosis not present

## 2020-07-07 DIAGNOSIS — I350 Nonrheumatic aortic (valve) stenosis: Secondary | ICD-10-CM | POA: Diagnosis not present

## 2020-07-07 DIAGNOSIS — K7581 Nonalcoholic steatohepatitis (NASH): Secondary | ICD-10-CM | POA: Diagnosis not present

## 2020-07-07 DIAGNOSIS — G4733 Obstructive sleep apnea (adult) (pediatric): Secondary | ICD-10-CM | POA: Diagnosis not present

## 2020-07-07 DIAGNOSIS — I509 Heart failure, unspecified: Secondary | ICD-10-CM | POA: Diagnosis not present

## 2020-07-07 DIAGNOSIS — N1831 Chronic kidney disease, stage 3a: Secondary | ICD-10-CM | POA: Diagnosis not present

## 2020-07-07 DIAGNOSIS — E1165 Type 2 diabetes mellitus with hyperglycemia: Secondary | ICD-10-CM | POA: Diagnosis not present

## 2020-07-07 DIAGNOSIS — I130 Hypertensive heart and chronic kidney disease with heart failure and stage 1 through stage 4 chronic kidney disease, or unspecified chronic kidney disease: Secondary | ICD-10-CM | POA: Diagnosis not present

## 2020-07-07 DIAGNOSIS — E876 Hypokalemia: Secondary | ICD-10-CM | POA: Diagnosis not present

## 2020-07-07 DIAGNOSIS — D631 Anemia in chronic kidney disease: Secondary | ICD-10-CM | POA: Diagnosis not present

## 2020-07-08 ENCOUNTER — Ambulatory Visit: Payer: Federal, State, Local not specified - PPO | Admitting: Family Medicine

## 2020-07-08 DIAGNOSIS — E785 Hyperlipidemia, unspecified: Secondary | ICD-10-CM | POA: Diagnosis not present

## 2020-07-08 DIAGNOSIS — G4733 Obstructive sleep apnea (adult) (pediatric): Secondary | ICD-10-CM | POA: Diagnosis not present

## 2020-07-08 DIAGNOSIS — I5033 Acute on chronic diastolic (congestive) heart failure: Secondary | ICD-10-CM | POA: Diagnosis not present

## 2020-07-08 DIAGNOSIS — I130 Hypertensive heart and chronic kidney disease with heart failure and stage 1 through stage 4 chronic kidney disease, or unspecified chronic kidney disease: Secondary | ICD-10-CM | POA: Diagnosis not present

## 2020-07-08 DIAGNOSIS — N179 Acute kidney failure, unspecified: Secondary | ICD-10-CM | POA: Diagnosis not present

## 2020-07-08 DIAGNOSIS — N1831 Chronic kidney disease, stage 3a: Secondary | ICD-10-CM | POA: Diagnosis not present

## 2020-07-08 DIAGNOSIS — K7290 Hepatic failure, unspecified without coma: Secondary | ICD-10-CM | POA: Diagnosis not present

## 2020-07-08 DIAGNOSIS — K7581 Nonalcoholic steatohepatitis (NASH): Secondary | ICD-10-CM | POA: Diagnosis not present

## 2020-07-08 DIAGNOSIS — E876 Hypokalemia: Secondary | ICD-10-CM | POA: Diagnosis not present

## 2020-07-08 DIAGNOSIS — E1122 Type 2 diabetes mellitus with diabetic chronic kidney disease: Secondary | ICD-10-CM | POA: Diagnosis not present

## 2020-07-08 DIAGNOSIS — I2510 Atherosclerotic heart disease of native coronary artery without angina pectoris: Secondary | ICD-10-CM | POA: Diagnosis not present

## 2020-07-08 DIAGNOSIS — D631 Anemia in chronic kidney disease: Secondary | ICD-10-CM | POA: Diagnosis not present

## 2020-07-08 DIAGNOSIS — I350 Nonrheumatic aortic (valve) stenosis: Secondary | ICD-10-CM | POA: Diagnosis not present

## 2020-07-08 DIAGNOSIS — M1991 Primary osteoarthritis, unspecified site: Secondary | ICD-10-CM | POA: Diagnosis not present

## 2020-07-08 DIAGNOSIS — E1165 Type 2 diabetes mellitus with hyperglycemia: Secondary | ICD-10-CM | POA: Diagnosis not present

## 2020-07-08 DIAGNOSIS — K7460 Unspecified cirrhosis of liver: Secondary | ICD-10-CM | POA: Diagnosis not present

## 2020-07-08 NOTE — Progress Notes (Signed)
Referring Provider: [name], MD Primary Care Physician:  [name], MD Primary GI Physician: Dr. Gala Romney  Chief Complaint  Patient presents with  . Cirrhosis    HPI:   [name] is a 59 y.o. male presenting today for follow-up of suspected NASH cirrhosis and IDA.  History of type 2 diabetes, HTN, HLD, CKD, CAD s/p CABG in 2016, arthritis, polyclonal gammopathy, thrombocytopenia, chronic anemiawith iron deficiency noted in January 2020 following with hematology, and cirrhosis. Prior GI evaluation locally with incomplete colonoscopies due to poor prep.  3 tubular adenomas and 1 hyperplastic polyp noted on colonoscopy in December 2020.  Right colon biopsy with focal active colitis suspicious for self-limiting colitis.  Also with findings suggestive of portal colopathy and rectal varices.  EGD December 2020 with 4 columns of grade 2-3 esophageal varices, portal gastropathy, gastric polyp/abnormal gastric mucosa biopsy.  Pathology with hyperplastic polyp, mild chronic gastritis, negative H. Pylori.  Last seen in our office 01/09/2020.  Prior episodes of diarrhea have resolved with discontinuation of Kombiglyze XR.  Continued to follow with cancer center in [place].  No overt GI bleeding.  Regarding suspected cirrhosis, admitted to drinking heavily in the past but no alcohol since 1982.  No history of IV or intranasal drug use.  Father with liver cancer in his 76s.  No other known liver disease in the family.  No known autoimmune conditions.  Reported chronic lower extremity edema since CABG in 2016 currently on Lasix 20 mg daily.  Denied abdominal swelling, jaundice, confusion, dark urine, bruising, or bleeding.  Plan to update labs, obtain ultrasound, repeat colonoscopy due to poor prep, continue current diuretic regimen, could consider Givens capsule in the future if he were to have downtrending hemoglobin or iron stores.  Labs completed with hemoglobin slightly low but stable  at 12.4, platelets low at 55, AST slightly elevated at 47 but stable, other LFTs and bilirubin within normal limits, autoimmune labs unrevealing other than elevated IgA which has been followed by hematology/allergy, INR within normal limits, AFP within normal limits, electrolytes within normal limits, kidney function elevated but stable with creatinine 1.4.  Iron elevated at 216, percent saturation 64%.  Recommended following up with hematology and considering discontinuing iron.  Labs corresponded to meld 11, Child Pugh A.   Ultrasound confirmed cirrhosis with splenomegaly.  Also with 9 mm gallbladder polyp.  Due to patient's age and size of polyp, there is indication for cholecystectomy.  He is referred to [facility] surgery who ultimately recommended referral to [facility].  He was started on nadolol 20 mg daily in February 2021.   Colonoscopy 03/17/2020: Again with poor prep.  Much of the colon not visualized.  Rectal varices present.  Hospitalized 06/20/2020-07/01/2020 with significant volume overload/anasarca-multifactorial in the setting of acute on chronic diastolic CHF and cirrhosis.  He had weight gain, worsening lower extremity edema up to the thigh, scrotal edema, increased abdominal girth.  Cardiology managed diuretics during hospitalization.  Ultrasound revealed small volume ascites. Had over 30 L removed during hospitalization with weight down to 272 pounds from 319 pounds on admission.  He was discharged on Demadex 60 mg twice daily and Aldactone 100 mg daily.  Advised to consume no more than 2 L of fluid daily.  Also with acute on chronic stage III kidney disease.  Creatinine improved back to baseline at 1.56 at discharge.  GI was consulted during hospitalization due to worsening anemia with hemoglobin 9.8 on admission.  No overt GI bleeding.  Hemoglobin drifted down to 8.5 but improved to 10.2 by discharge.  Overall, felt IDA was multifactorial in setting of chronic disease, CKD, and cirrhosis.   Unable to rule out colonic etiology due to unsuccessful colonoscopy attempts locally.  Normal grade 2-3 esophageal varices with no prior bleed and on bleeding prophylaxis with nadolol 20 mg daily.  Recommended repeat colonoscopy outpatient with [facility].  He would also continue to follow with oncology in outpatient setting for evaluation of pancytopenia. Regarding cirrhosis, MELD up to 20 during hospitalization, up from 11 when he was last seen in office in January 2021.  He had mild hepatic encephalopathy on admission which improved on lactulose.  Patient has appointment with [facility] on 07/15/2020.   Today:  Weight is down to 250 lbs today.   Feeling much better. Swelling much improved. No abdominal pain. No mental status changes. Taking 30 ml lactulose once a day. Having about 3-5 BMs daily. No bruising or bleeding. No yellowing of eyes or skin or dark urine.   Following a low sodium diet. Not more than 1800 mg sodium daily. No more than 2L fluid daily.   No brbpr or melena. No lightheadedness, pre-syncope, or syncope. Continues to take oral iron.   No nausea or vomiting. No GERD symptoms. No dysphagia.   Nadolol 20 mg daily. Blood pressure in 120s/65-70 at home. Pulse usually in the 70s.   No CP or heart palpitations. No shortness of breath or cough.   Past Medical History:  Diagnosis Date  . Anemia   . Arthritis   . CAD (coronary artery disease)    Multivessel disease status post CABG 08/2015  . CKD (chronic kidney disease) stage 3, GFR 30-59 ml/min   . Diastolic CHF ([place])   . Essential hypertension   . Hyperlipidemia   . OSA on CPAP   . Polyclonal gammopathy   . Thrombocytopenia ([place]) 2016  . Type 2 diabetes mellitus ([place])     Past Surgical History:  Procedure Laterality Date  . APPENDECTOMY    . Biceps tendon surgery Right   . BIOPSY  11/22/2019   Procedure: BIOPSY;  Surgeon: Daneil Dolin, MD;  [facility];  Service: Endoscopy;;  . CARDIAC CATHETERIZATION N/A  08/25/2015   Procedure: Left Heart Cath and Coronary Angiography;  Surgeon: Belva Crome, MD;  Location: [facility];  Service: Cardiovascular;  Laterality: N/A;  . COLONOSCOPY WITH PROPOFOL N/A 11/22/2019   Procedure: COLONOSCOPY WITH PROPOFOL;  Surgeon: Daneil Dolin, MD; Four 4-5 mm polyps, findings suggestive of portal colopathy, congested appearing colonic mucosa diffusely, rectal varices, and adequate right colon prep.  Pathology with tubular adenomas and hyperplastic polyp.  Right colon biopsy with focal active colitis.  Recommendations to repeat colonoscopy in 3 months due to poor prep.  . COLONOSCOPY WITH PROPOFOL N/A 03/17/2020   Procedure: COLONOSCOPY WITH PROPOFOL;  Surgeon: Daneil Dolin, MD;  [facility]: [facility];  Service: Endoscopy;  Laterality: N/A;  8:45am - pt does not need covid test, was + 2/4 <90 days  . CORONARY ARTERY BYPASS GRAFT N/A 08/29/2015   Procedure: CORONARY ARTERY BYPASS GRAFTING (CABG);  Surgeon: [name], MD;  Location: [facility];  Service: Open Heart Surgery;  Laterality: N/A;  . ESOPHAGOGASTRODUODENOSCOPY (EGD) WITH PROPOFOL N/A 11/22/2019   Procedure: ESOPHAGOGASTRODUODENOSCOPY (EGD) WITH PROPOFOL;  Surgeon: Daneil Dolin, MD; 4 columns of grade 2-3 esophageal varices, portal gastropathy, gastric polyp/abnormal gastric mucosa s/p biopsy.  Pathology with hyperplastic polyp, mild chronic  gastritis, negative H. pylori.  [name] KNEE ARTHROSCOPY Left   . TEE WITHOUT CARDIOVERSION N/A 08/29/2015   Procedure: TRANSESOPHAGEAL ECHOCARDIOGRAM (TEE);  Surgeon: [name], MD;  Location: [facility];  Service: Open Heart Surgery;  Laterality: N/A;  . TOTAL KNEE ARTHROPLASTY Left 03/09/2017   Procedure: TOTAL KNEE ARTHROPLASTY;  Surgeon: Carole Civil, MD;  Location: [facility];  Service: Orthopedics;  Laterality: Left;    Current Outpatient Medications  Medication Sig Dispense Refill  . albuterol (VENTOLIN HFA) 108 (90 Base) MCG/ACT inhaler Inhale 2  puffs into the lungs every 6 (six) hours as needed for wheezing or shortness of breath. 18 g 0  . Armodafinil 200 MG TABS Take 200 mg by mouth daily as needed (for sleepiness).    [name] aspirin EC 81 MG tablet Take 81 mg by mouth at bedtime.     . Cholecalciferol (DIALYVITE VITAMIN D 5000) 125 MCG (5000 UT) capsule Take 5,000 Units by mouth in the morning.     [name] Bandages & Supports (MEDICAL COMPRESSION STOCKINGS) MISC 1 each by Does not apply route as directed. Low pressure knee high compression stockings Dx: leg swelling 1 each 0  . ELDERBERRY PO Take 1 capsule by mouth at bedtime. With  Vitamin c and zinc     . ferrous sulfate 325 (65 FE) MG tablet Take 325 mg by mouth in the morning.     . Flaxseed, Linseed, (FLAXSEED OIL PO) Take 1,400 mg by mouth at bedtime.     . insulin aspart (NOVOLOG) 100 UNIT/ML injection Inject 5-30 Units into the skin 3 (three) times daily before meals.     . lactulose (CHRONULAC) 10 GM/15ML solution Take 30 mLs (20 g total) by mouth daily. 236 mL 0  . loratadine (CLARITIN) 10 MG tablet Take 10 mg by mouth in the morning.     . Magnesium 250 MG TABS Take 250 mg by mouth daily.    [name] omeprazole (PRILOSEC) 40 MG capsule Take 40 mg by mouth at bedtime.  2  . pravastatin (PRAVACHOL) 20 MG tablet TAKE 1 TABLET(20 MG) BY MOUTH AT BEDTIME 90 tablet 3  . spironolactone (ALDACTONE) 100 MG tablet Take 1 tablet (100 mg total) by mouth daily. 30 tablet 2  . traMADol-acetaminophen (ULTRACET) 37.5-325 MG tablet Take 1 tablet by mouth every 4 (four) hours as needed for severe pain.    . TRESIBA FLEXTOUCH 200 UNIT/ML SOPN Inject 40-70 Units into the skin See admin instructions. Inject 70 units into the skin in the morning and 40 units at bedtime    . TURMERIC CURCUMIN PO Take 2,000 mg by mouth at bedtime.     [name] amoxicillin-clavulanate (AUGMENTIN) 875-125 MG tablet Take 1 tablet by mouth 2 (two) times daily.    . nadolol (CORGARD) 40 MG tablet Take 1 tablet (40 mg total) by mouth  daily. 30 tablet 3  . potassium chloride SA (KLOR-CON) 20 MEQ tablet Take 1 tablet (20 mEq total) by mouth daily. 90 tablet 3  . torsemide (DEMADEX) 20 MG tablet Take 2 tablets (40 mg total) by mouth 2 (two) times daily. 360 tablet 3   No current facility-administered medications for this visit.    Allergies as of 07/09/2020  . (No Known Allergies)    Family History  Problem Relation Age of Onset  . Arthritis Other   . Cancer Other   . Diabetes Other   . CAD Father   . Diabetes Mellitus II Father   . Liver cancer  Father   . Hodgkin's lymphoma Father   . CAD Brother   . Diabetes Mellitus II Brother   . ALS Mother   . Diabetes Mellitus II Sister   . Diabetes Mellitus II Brother   . Diabetes Mellitus II Maternal Grandmother   . Aneurysm Maternal Grandmother   . Cancer Maternal Grandfather   . Anesthesia problems Neg Hx   . Hypotension Neg Hx   . Malignant hyperthermia Neg Hx   . Pseudochol deficiency Neg Hx   . Colon cancer Neg Hx     Social History   Socioeconomic History  . Marital status: Married    Spouse name: Not on file  . Number of children: 0  . Years of education: college  . Highest education level: Not on file  Occupational History  . Occupation: Maintenance tech    Employer: [employer]  Tobacco Use  . Smoking status: Never Smoker  . Smokeless tobacco: Never Used  Vaping Use  . Vaping Use: Never used  Substance and Sexual Activity  . Alcohol use: No    Alcohol/week: 0.0 standard drinks  . Drug use: No  . Sexual activity: Not Currently  Other Topics Concern  . Not on file  Social History Narrative  . Not on file   Social Determinants of Health   Financial Resource Strain:   . Difficulty of Paying Living Expenses:   Food Insecurity:   . Worried About Charity fundraiser in the Last Year:   . Arboriculturist in the Last Year:   Transportation Needs:   . Film/video editor (Medical):   [name] Lack of Transportation (Non-Medical):    Physical Activity:   . Days of Exercise per Week:   . Minutes of Exercise per Session:   Stress:   . Feeling of Stress :   Social Connections:   . Frequency of Communication with Friends and Family:   . Frequency of Social Gatherings with Friends and Family:   . Attends Religious Services:   . Active Member of Clubs or Organizations:   . Attends Archivist Meetings:   [name] Marital Status:     Review of Systems: Gen: Denies fever, chills CV: See HPI Resp: See HPI GI: See HPI  Heme: See HPI  Physical Exam: BP 116/73   Pulse 73   Temp (!) 97.1 F (36.2 C) (Temporal)   Ht 5\' 11"  (1.803 m)   Wt (!) 250 lb 6.4 oz (113.6 kg)   BMI 34.92 kg/m  General:   Alert and oriented. No distress noted. Pleasant and cooperative.  Head:  Normocephalic and atraumatic. Eyes:  Conjuctiva clear without scleral icterus. Heart:  S1, S2 present without murmurs appreciated. Lungs:  Clear to auscultation bilaterally. No wheezes, rales, or rhonchi. No distress.  Abdomen:  +BS, soft, non-tender and non-distended. No rebound or guarding. No HSM or masses noted. Msk:  Symmetrical without gross deformities. Normal posture. Extremities: Minimal LE enema around the ankles.  Neurologic:  Alert and  oriented x4 Psych:  Normal mood and affect.

## 2020-07-09 ENCOUNTER — Other Ambulatory Visit: Payer: Self-pay

## 2020-07-09 ENCOUNTER — Other Ambulatory Visit (HOSPITAL_COMMUNITY): Payer: Self-pay | Admitting: *Deleted

## 2020-07-09 ENCOUNTER — Ambulatory Visit (INDEPENDENT_AMBULATORY_CARE_PROVIDER_SITE_OTHER): Payer: Federal, State, Local not specified - PPO | Admitting: Gastroenterology

## 2020-07-09 ENCOUNTER — Encounter: Payer: Self-pay | Admitting: Gastroenterology

## 2020-07-09 VITALS — BP 116/73 | HR 73 | Temp 97.1°F | Ht 71.0 in | Wt 250.4 lb

## 2020-07-09 DIAGNOSIS — D61818 Other pancytopenia: Secondary | ICD-10-CM | POA: Diagnosis not present

## 2020-07-09 DIAGNOSIS — I8500 Esophageal varices without bleeding: Secondary | ICD-10-CM | POA: Diagnosis not present

## 2020-07-09 DIAGNOSIS — E1129 Type 2 diabetes mellitus with other diabetic kidney complication: Secondary | ICD-10-CM | POA: Diagnosis not present

## 2020-07-09 DIAGNOSIS — I10 Essential (primary) hypertension: Secondary | ICD-10-CM | POA: Diagnosis not present

## 2020-07-09 DIAGNOSIS — D649 Anemia, unspecified: Secondary | ICD-10-CM

## 2020-07-09 DIAGNOSIS — K7460 Unspecified cirrhosis of liver: Secondary | ICD-10-CM | POA: Diagnosis not present

## 2020-07-09 DIAGNOSIS — Z0000 Encounter for general adult medical examination without abnormal findings: Secondary | ICD-10-CM | POA: Diagnosis not present

## 2020-07-09 DIAGNOSIS — I5030 Unspecified diastolic (congestive) heart failure: Secondary | ICD-10-CM | POA: Diagnosis not present

## 2020-07-09 DIAGNOSIS — D509 Iron deficiency anemia, unspecified: Secondary | ICD-10-CM

## 2020-07-09 DIAGNOSIS — Z1389 Encounter for screening for other disorder: Secondary | ICD-10-CM | POA: Diagnosis not present

## 2020-07-09 DIAGNOSIS — D696 Thrombocytopenia, unspecified: Secondary | ICD-10-CM

## 2020-07-09 DIAGNOSIS — Z6836 Body mass index (BMI) 36.0-36.9, adult: Secondary | ICD-10-CM | POA: Diagnosis not present

## 2020-07-09 DIAGNOSIS — N182 Chronic kidney disease, stage 2 (mild): Secondary | ICD-10-CM | POA: Diagnosis not present

## 2020-07-09 MED ORDER — NADOLOL 40 MG PO TABS: 40.0000 mg | ORAL_TABLET | Freq: Every day | ORAL | 3 refills | Status: DC

## 2020-07-09 NOTE — Patient Instructions (Addendum)
Please have labs completed at [facility].  Please stop nadolol 20 mg and start nadolol 40 mg daily. Please check your blood pressures and heart rate daily and keep a log of this. Please bring blood pressure log back to our office in 1 week. If you have any blood pressures below 90/60 or if you feel lightheaded, dizzy, or like you might pass out, please let me know immediately as we may need to reduce the dose of nadolol.  Continue following a strict low-sodium diet.  No more than 1800 mg daily. Continue following fluid restrictions as directed by cardiology.  Continue taking torsemide 60 mg twice a day and spironolactone 100 mg daily.  Continue taking lactulose 30 mL daily with a goal of 3-4 bowel movements daily.  If you need to take Tylenol, be sure not to take more than 2000 in 24 hours.   Monitor for any bright red blood per rectum, black stool, change in mental status, swelling in your abdomen or lower extremities, yellowing of the eyes and let me know if this occurs.  Keep your upcoming appointment with [facility].  You will also need a colonoscopy with [facility].  We will plan to see you back in 3 months.  Do not hesitate to call with questions or concerns prior.  Aliene Altes, PA-C [facility]

## 2020-07-10 ENCOUNTER — Inpatient Hospital Stay (HOSPITAL_COMMUNITY): Payer: Federal, State, Local not specified - PPO

## 2020-07-10 DIAGNOSIS — D696 Thrombocytopenia, unspecified: Secondary | ICD-10-CM | POA: Diagnosis not present

## 2020-07-10 DIAGNOSIS — D649 Anemia, unspecified: Secondary | ICD-10-CM

## 2020-07-10 DIAGNOSIS — D509 Iron deficiency anemia, unspecified: Secondary | ICD-10-CM

## 2020-07-10 LAB — COMPREHENSIVE METABOLIC PANEL
ALT: 15 U/L (ref 0–44)
AST: 29 U/L (ref 15–41)
Albumin: 5 g/dL (ref 3.5–5.0)
Alkaline Phosphatase: 63 U/L (ref 38–126)
Anion gap: 13 (ref 5–15)
BUN: 49 mg/dL — ABNORMAL HIGH (ref 6–20)
CO2: 22 mmol/L (ref 22–32)
Calcium: 10.2 mg/dL (ref 8.9–10.3)
Chloride: 99 mmol/L (ref 98–111)
Creatinine, Ser: 2.17 mg/dL — ABNORMAL HIGH (ref 0.61–1.24)
GFR calc Af Amer: 37 mL/min — ABNORMAL LOW (ref >60)
GFR calc non Af Amer: 32 mL/min — ABNORMAL LOW (ref >60)
Glucose, Bld: 276 mg/dL — ABNORMAL HIGH (ref 70–99)
Potassium: 4.7 mmol/L (ref 3.5–5.1)
Sodium: 134 mmol/L — ABNORMAL LOW (ref 135–145)
Total Bilirubin: 1.8 mg/dL — ABNORMAL HIGH (ref 0.3–1.2)
Total Protein: 8.9 g/dL — ABNORMAL HIGH (ref 6.5–8.1)

## 2020-07-10 LAB — IRON AND TIBC
Iron: 93 ug/dL (ref 45–182)
Saturation Ratios: 35 % (ref 17.9–39.5)
TIBC: 264 ug/dL (ref 250–450)
UIBC: 171 ug/dL

## 2020-07-10 LAB — CBC WITH DIFFERENTIAL/PLATELET
Abs Immature Granulocytes: 0.01 10*3/uL (ref 0.00–0.07)
Basophils Absolute: 0 10*3/uL (ref 0.0–0.1)
Basophils Relative: 1 %
Eosinophils Absolute: 0.2 10*3/uL (ref 0.0–0.5)
Eosinophils Relative: 4 %
HCT: 35.5 % — ABNORMAL LOW (ref 39.0–52.0)
Hemoglobin: 12 g/dL — ABNORMAL LOW (ref 13.0–17.0)
Immature Granulocytes: 0 %
Lymphocytes Relative: 17 %
Lymphs Abs: 0.9 10*3/uL (ref 0.7–4.0)
MCH: 33.4 pg (ref 26.0–34.0)
MCHC: 33.8 g/dL (ref 30.0–36.0)
MCV: 98.9 fL (ref 80.0–100.0)
Monocytes Absolute: 0.5 10*3/uL (ref 0.1–1.0)
Monocytes Relative: 10 %
Neutro Abs: 3.8 10*3/uL (ref 1.7–7.7)
Neutrophils Relative %: 68 %
Platelets: 92 10*3/uL — ABNORMAL LOW (ref 150–400)
RBC: 3.59 MIL/uL — ABNORMAL LOW (ref 4.22–5.81)
RDW: 13.4 % (ref 11.5–15.5)
WBC: 5.5 10*3/uL (ref 4.0–10.5)
nRBC: 0 % (ref 0.0–0.2)

## 2020-07-10 LAB — FERRITIN: Ferritin: 291 ng/mL (ref 24–336)

## 2020-07-11 ENCOUNTER — Other Ambulatory Visit: Payer: Self-pay

## 2020-07-11 ENCOUNTER — Ambulatory Visit (INDEPENDENT_AMBULATORY_CARE_PROVIDER_SITE_OTHER): Payer: Federal, State, Local not specified - PPO | Admitting: Student

## 2020-07-11 ENCOUNTER — Encounter: Payer: Self-pay | Admitting: Student

## 2020-07-11 VITALS — BP 117/68 | HR 77 | Ht 70.0 in | Wt 249.2 lb

## 2020-07-11 DIAGNOSIS — I10 Essential (primary) hypertension: Secondary | ICD-10-CM | POA: Diagnosis not present

## 2020-07-11 DIAGNOSIS — I25119 Atherosclerotic heart disease of native coronary artery with unspecified angina pectoris: Secondary | ICD-10-CM

## 2020-07-11 DIAGNOSIS — Z79899 Other long term (current) drug therapy: Secondary | ICD-10-CM

## 2020-07-11 DIAGNOSIS — I350 Nonrheumatic aortic (valve) stenosis: Secondary | ICD-10-CM | POA: Diagnosis not present

## 2020-07-11 DIAGNOSIS — I5032 Chronic diastolic (congestive) heart failure: Secondary | ICD-10-CM

## 2020-07-11 DIAGNOSIS — N183 Chronic kidney disease, stage 3 unspecified: Secondary | ICD-10-CM | POA: Diagnosis not present

## 2020-07-11 DIAGNOSIS — E785 Hyperlipidemia, unspecified: Secondary | ICD-10-CM | POA: Diagnosis not present

## 2020-07-11 DIAGNOSIS — I6523 Occlusion and stenosis of bilateral carotid arteries: Secondary | ICD-10-CM

## 2020-07-11 MED ORDER — POTASSIUM CHLORIDE CRYS ER 20 MEQ PO TBCR
20.0000 meq | EXTENDED_RELEASE_TABLET | Freq: Every day | ORAL | 3 refills | Status: DC
Start: 2020-07-11 — End: 2020-08-15

## 2020-07-11 MED ORDER — TORSEMIDE 20 MG PO TABS
40.0000 mg | ORAL_TABLET | Freq: Two times a day (BID) | ORAL | 3 refills | Status: DC
Start: 2020-07-11 — End: 2020-08-22

## 2020-07-11 NOTE — Patient Instructions (Signed)
Medication Instructions:  Your physician has recommended you make the following change in your medication:   Decrease Torsemide to 40 mg Two Times Daily  Decrease KDur to 20 meq Daily    *If you need a refill on your cardiac medications before your next appointment, please call your pharmacy*   Lab Work: Your physician recommends that you return for lab work in: 2 Weeks   If you have labs (blood work) drawn today and your tests are completely normal, you will receive your results only by: [name] MyChart Message (if you have MyChart) OR . A paper copy in the mail If you have any lab test that is abnormal or we need to change your treatment, we will call you to review the results.   Testing/Procedures: NONE    Follow-Up: At [facility], you and your health needs are our priority.  As part of our continuing mission to provide you with exceptional heart care, we have created designated Provider Care Teams.  These Care Teams include your primary Cardiologist (physician) and Advanced Practice Providers (APPs -  Physician Assistants and Nurse Practitioners) who all work together to provide you with the care you need, when you need it.  We recommend signing up for the patient portal called "MyChart".  Sign up information is provided on this After Visit Summary.  MyChart is used to connect with patients for Virtual Visits (Telemedicine).  Patients are able to view lab/test results, encounter notes, upcoming appointments, etc.  Non-urgent messages can be sent to your provider as well.   To learn more about what you can do with MyChart, go to [email or web address].    Your next appointment:   2 month(s)  The format for your next appointment:   In Person  Provider:   Rozann Lesches, MD or Bernerd Pho, PA-C   Other Instructions Thank you for choosing [facility]!

## 2020-07-11 NOTE — Progress Notes (Signed)
Cardiology Office Note    Date:  07/12/2020   ID:  Eliberto, Sole [birth year], MRN [id number]  PCP:  Christopher Sites, MD  Cardiologist: Rozann Lesches, MD    Chief Complaint  Patient presents with  . Hospitalization Follow-up    History of Present Illness:    [name] is a 59 y.o. male with past medical history of CAD (s/p CABG in 2016), HTN, HLD, Type 2 DM, Stage 3 CKD, OSA, thrombocytopenia and polyclonal gammopathy who presents to the office today for hospital follow-up.  He was examined by Dr. Domenic Polite on 06/20/2020 and was found to be significantly volume overloaded in the setting of his weight having increased by 60 pounds since 03/2020. He was directly admitted to the hospital for an acute CHF exacerbation. Weight had been elevated to 319 lbs on the office scales prior to admission and he responded well with IV Lasix with a recorded net output of -14.3 L during admission and weight had declined to 272 lbs. He was transitioned to Torsemide 60 mg twice daily. He did have an abdominal ultrasound during admission which showed cirrhosis with portal hypertension and ascites. He was continued on Nadolol and Spironolactone was further titrated during admission. He was evaluated by GI and referred to the Hepatology Clinic at [facility] for further management. He was discharged home on 07/01/2020 and creatinine was 1.56 at that time (peaked at 2.11 during admission).  He did have repeat labs on 07/10/2020 which showed WBC 5.5, Hgb 12.0 and platelets 92. K+ was stable at 4.7 but creatinine had trended upwards to 2.17.  In talking with the patient today, he reports overall doing well since his recent hospitalization. He feels like his breathing has significantly improved and his lower extremity pain and edema have resolved. His weight has declined by an additional 23 pounds since hospital discharge. He has been following his daily weights at home along with trying to limit his sodium  intake. He denies any specific orthopnea, PND, chest pain or palpitations.  Past Medical History:  Diagnosis Date  . Anemia   . Arthritis   . CAD (coronary artery disease)    Multivessel disease status post CABG 08/2015  . CKD (chronic kidney disease) stage 3, GFR 30-59 ml/min   . Diastolic CHF ([place])   . Essential hypertension   . Hyperlipidemia   . OSA on CPAP   . Polyclonal gammopathy   . Thrombocytopenia ([place]) 2016  . Type 2 diabetes mellitus ([place])     Past Surgical History:  Procedure Laterality Date  . APPENDECTOMY    . Biceps tendon surgery Right   . BIOPSY  11/22/2019   Procedure: BIOPSY;  Surgeon: Daneil Dolin, MD;  [facility];  Service: Endoscopy;;  . CARDIAC CATHETERIZATION N/A 08/25/2015   Procedure: Left Heart Cath and Coronary Angiography;  Surgeon: Belva Crome, MD;  Location: [place] CV LAB;  Service: Cardiovascular;  Laterality: N/A;  . COLONOSCOPY WITH PROPOFOL N/A 11/22/2019   Procedure: COLONOSCOPY WITH PROPOFOL;  Surgeon: Daneil Dolin, MD; Four 4-5 mm polyps, findings suggestive of portal colopathy, congested appearing colonic mucosa diffusely, rectal varices, and adequate right colon prep.  Pathology with tubular adenomas and hyperplastic polyp.  Right colon biopsy with focal active colitis.  Recommendations to repeat colonoscopy in 3 months due to poor prep.  . COLONOSCOPY WITH PROPOFOL N/A 03/17/2020   Procedure: COLONOSCOPY WITH PROPOFOL;  Surgeon: Daneil Dolin, MD;  [facility];  Service: Endoscopy;  Laterality: N/A;  8:45am - pt does not need covid test, was + 2/4 <90 days  . CORONARY ARTERY BYPASS GRAFT N/A 08/29/2015   Procedure: CORONARY ARTERY BYPASS GRAFTING (CABG);  Surgeon: [name], MD;  Location: [place];  Service: Open Heart Surgery;  Laterality: N/A;  . ESOPHAGOGASTRODUODENOSCOPY (EGD) WITH PROPOFOL N/A 11/22/2019   Procedure: ESOPHAGOGASTRODUODENOSCOPY (EGD) WITH PROPOFOL;  Surgeon: Daneil Dolin, MD;  4 columns of grade 2-3 esophageal varices, portal gastropathy, gastric polyp/abnormal gastric mucosa s/p biopsy.  Pathology with hyperplastic polyp, mild chronic gastritis, negative H. pylori.  [name] KNEE ARTHROSCOPY Left   . TEE WITHOUT CARDIOVERSION N/A 08/29/2015   Procedure: TRANSESOPHAGEAL ECHOCARDIOGRAM (TEE);  Surgeon: [name], MD;  Location: [place];  Service: Open Heart Surgery;  Laterality: N/A;  . TOTAL KNEE ARTHROPLASTY Left 03/09/2017   Procedure: TOTAL KNEE ARTHROPLASTY;  Surgeon: Carole Civil, MD;  Location: [facility] OR[facility];  Service: Orthopedics;  Laterality: Left;    Current Medications: Outpatient Medications Prior to Visit  Medication Sig Dispense Refill  . albuterol (VENTOLIN HFA) 108 (90 Base) MCG/ACT inhaler Inhale 2 puffs into the lungs every 6 (six) hours as needed for wheezing or shortness of breath. 18 g 0  . amoxicillin-clavulanate (AUGMENTIN) 875-125 MG tablet Take 1 tablet by mouth 2 (two) times daily.    . Armodafinil 200 MG TABS Take 200 mg by mouth daily as needed (for sleepiness).    [name] aspirin EC 81 MG tablet Take 81 mg by mouth at bedtime.     . Cholecalciferol (DIALYVITE VITAMIN D 5000) 125 MCG (5000 UT) capsule Take 5,000 Units by mouth in the morning.     [name] Bandages & Supports (MEDICAL COMPRESSION STOCKINGS) MISC 1 each by Does not apply route as directed. Low pressure knee high compression stockings Dx: leg swelling 1 each 0  . ELDERBERRY PO Take 1 capsule by mouth at bedtime. With  Vitamin c and zinc     . ferrous sulfate 325 (65 FE) MG tablet Take 325 mg by mouth in the morning.     . Flaxseed, Linseed, (FLAXSEED OIL PO) Take 1,400 mg by mouth at bedtime.     . insulin aspart (NOVOLOG) 100 UNIT/ML injection Inject 5-30 Units into the skin 3 (three) times daily before meals.     . lactulose (CHRONULAC) 10 GM/15ML solution Take 30 mLs (20 g total) by mouth daily. 236 mL 0  . loratadine (CLARITIN) 10 MG tablet Take 10 mg by mouth in the morning.      . Magnesium 250 MG TABS Take 250 mg by mouth daily.    . nadolol (CORGARD) 40 MG tablet Take 1 tablet (40 mg total) by mouth daily. 30 tablet 3  . omeprazole (PRILOSEC) 40 MG capsule Take 40 mg by mouth at bedtime.  2  . pravastatin (PRAVACHOL) 20 MG tablet TAKE 1 TABLET(20 MG) BY MOUTH AT BEDTIME 90 tablet 3  . spironolactone (ALDACTONE) 100 MG tablet Take 1 tablet (100 mg total) by mouth daily. 30 tablet 2  . traMADol-acetaminophen (ULTRACET) 37.5-325 MG tablet Take 1 tablet by mouth every 4 (four) hours as needed for severe pain.    . TRESIBA FLEXTOUCH 200 UNIT/ML SOPN Inject 40-70 Units into the skin See admin instructions. Inject 70 units into the skin in the morning and 40 units at bedtime    . TURMERIC CURCUMIN PO Take 2,000 mg by mouth at bedtime.     . potassium chloride SA (KLOR-CON)  20 MEQ tablet Take 2 tablets (40 mEq total) by mouth daily. 60 tablet 1  . torsemide (DEMADEX) 20 MG tablet Take 3 tablets (60 mg total) by mouth 2 (two) times daily. 60 tablet 2   No facility-administered medications prior to visit.     Allergies:   Patient has no known allergies.   Social History   Socioeconomic History  . Marital status: Married    Spouse name: Not on file  . Number of children: 0  . Years of education: college  . Highest education level: Not on file  Occupational History  . Occupation: Maintenance tech    Employer: [employer]  Tobacco Use  . Smoking status: Never Smoker  . Smokeless tobacco: Never Used  Vaping Use  . Vaping Use: Never used  Substance and Sexual Activity  . Alcohol use: No    Alcohol/week: 0.0 standard drinks  . Drug use: No  . Sexual activity: Not Currently  Other Topics Concern  . Not on file  Social History Narrative  . Not on file   Social Determinants of Health   Financial Resource Strain:   . Difficulty of Paying Living Expenses:   Food Insecurity:   . Worried About Charity fundraiser in the Last Year:   . Arboriculturist in the  Last Year:   Transportation Needs:   . Film/video editor (Medical):   [name] Lack of Transportation (Non-Medical):   Physical Activity:   . Days of Exercise per Week:   . Minutes of Exercise per Session:   Stress:   . Feeling of Stress :   Social Connections:   . Frequency of Communication with Friends and Family:   . Frequency of Social Gatherings with Friends and Family:   . Attends Religious Services:   . Active Member of Clubs or Organizations:   . Attends Archivist Meetings:   [name] Marital Status:      Family History:  The patient's family history includes ALS in his mother; Aneurysm in his maternal grandmother; Arthritis in an other family member; CAD in his brother and father; Cancer in his maternal grandfather and another family member; Diabetes in an other family member; Diabetes Mellitus II in his brother, brother, father, maternal grandmother, and sister; Hodgkin's lymphoma in his father; Liver cancer in his father.   Review of Systems:   Please see the history of present illness.     General:  No chills, fever, night sweats or weight changes.  Cardiovascular:  No chest pain, dyspnea on exertion, orthopnea, palpitations, paroxysmal nocturnal dyspnea. Positive for edema (improved).  Dermatological: No rash, lesions/masses Respiratory: No cough, dyspnea Urologic: No hematuria, dysuria Abdominal:   No nausea, vomiting, diarrhea, bright red blood per rectum, melena, or hematemesis Neurologic:  No visual changes, wkns, changes in mental status. All other systems reviewed and are otherwise negative except as noted above.   Physical Exam:    VS:  BP 117/68   Pulse 77   Ht 5\' 10"  (1.778 m)   Wt (!) 249 lb 3.2 oz (113 kg)   SpO2 96%   BMI 35.76 kg/m    General: Well developed, well nourished,male appearing in no acute distress. Head: Normocephalic, atraumatic, sclera non-icteric.  Neck: No carotid bruits. JVD not elevated.  Lungs: Respirations regular and  unlabored, without wheezes or rales.  Heart: Regular rate and rhythm. No S3 or S4.  No rubs or gallops appreciated. 2/6 SEM along RUSB.  Abdomen: Soft, non-tender,  non-distended. No obvious abdominal masses. Msk:  Strength and tone appear normal for age. No obvious joint deformities or effusions. Extremities: No clubbing or cyanosis. Trace ankle edema bilaterally.  Distal pedal pulses are 2+ bilaterally. Neuro: Alert and oriented X 3. Moves all extremities spontaneously. No focal deficits noted. Psych:  Responds to questions appropriately with a normal affect. Skin: No rashes or lesions noted  Wt Readings from Last 3 Encounters:  07/11/20 (!) 249 lb 3.2 oz (113 kg)  07/09/20 (!) 250 lb 6.4 oz (113.6 kg)  07/01/20 272 lb 14.9 oz (123.8 kg)     Studies/Labs Reviewed:   EKG:  EKG is not ordered today.    Recent Labs: 08/31/2019: TSH 2.400 06/20/2020: B Natriuretic Peptide 601.0 06/30/2020: Magnesium 1.9 07/10/2020: ALT 15; BUN 49; Creatinine, Ser 2.17; Hemoglobin 12.0; Platelets 92; Potassium 4.7; Sodium 134   Lipid Panel    Component Value Date/Time   CHOL 120 05/25/2019 1001   TRIG 136 05/25/2019 1001   HDL 38 (L) 05/25/2019 1001   CHOLHDL 3.2 05/25/2019 1001   VLDL 27 05/25/2019 1001   LDLCALC 55 05/25/2019 1001    Additional studies/ records that were reviewed today include:   Cardiac Catheterization: 08/2015 1. Mid RCA to Dist RCA lesion, 100% stenosed. 2. 1st Mrg lesion, 90% stenosed. 3. Ramus lesion, 70% stenosed. 4. Ost Cx to Dist Cx lesion, 60% stenosed. 5. Ost 1st Diag lesion, 90% stenosed. 6. Ost 2nd Diag to 2nd Diag lesion, 70% stenosed. 7. Mid LAD lesion, 90% stenosed. 8. Post Atrio lesion, 90% stenosed. 9. Dist RCA lesion, 100% stenosed.    Severe native vessel coronary artery disease with total occlusion of the distal RCA (likely culprit for acute presentation), high-grade obstruction in the proximal to mid LAD, high-grade obstruction in the first obtuse  marginal, significant stenosis in the ramus intermedius, first diagonal, and second diagonal.  LVEF 50% with marked elevation in LVEDP, consistent with acute diastolic left heart failure.  Distal right coronary is collateralized from the LAD and circumflex. The right coronary is dominant.   RECOMMENDATIONS:   TCTS consultation for consideration of surgical revascularization.  Continue aspirin. No Plavix. In TIMI IV heparin.   Echocardiogram: 05/2020 IMPRESSIONS    1. Left ventricular ejection fraction, by estimation, is 60 to 65%. The  left ventricle has normal function. The left ventricle has no regional  wall motion abnormalities. The left ventricular internal cavity size was  mildly dilated. There is mild left  ventricular hypertrophy. Left ventricular diastolic parameters are  indeterminate.  2. Right ventricular systolic function is normal. The right ventricular  size is normal. There is normal pulmonary artery systolic pressure.  3. The mitral valve is abnormal. Mild mitral valve regurgitation.  4. AV is thickened, calcifieid Difficult to see well. Peak and mean  gradients through the valve are 31 and 18 mm Hg respectively consistent  with mild AS. Compared to echo report from 2019, gradients are increased.  . The aortic valve is abnormal. Aortic  valve regurgitation is not visualized. Mild aortic valve stenosis.  5. The inferior vena cava is dilated in size with <50% respiratory  variability, suggesting right atrial pressure of 15 mmHg.   Assessment:    1. Chronic diastolic heart failure ([place])   2. Coronary artery disease involving native coronary artery of native heart with angina pectoris ([place])   3. Nonrheumatic aortic valve stenosis   4. Stage 3 chronic kidney disease, unspecified whether stage 3a or 3b CKD   5. Medication  management   6. Essential hypertension   7. Hyperlipidemia LDL goal <70      Plan:   In order of problems listed above:  1.  Chronic Diastolic CHF - He was recently admitted for an acute CHF exacerbation and weight declined by almost 50 lbs during admission and has declined by 23 lbs since hospital discharge with an overall weight loss of 70 lbs since his last office visit. He feels that his breathing has significantly improved and he only has trace ankle edema on examination today.  - He was discharged on Torsemide 60mg  BID and recent labs showed his creatinine has trended upwards to 2.17 (previously 1.56 at the time of hospital discharge). Will reduce Torsemide to 40mg  BID and obtain a repeat BMET in 2 weeks. He was encouraged to call us if his weight increases by more than 5 lbs. Sodium and fluid restriction reviewed.   2. CAD - He is s/p CABG in 2016. He denies any recent chest pain and his dyspnea has significantly improved with diuresis.  - Continue current medication regimen with ASA 81mg  daily, Nadolol 40mg  daily and Pravastatin 20mg  daily.   3. Aortic Stenosis - He did have mild AS by recent echocardiogram in 05/2020. Will continue to follow.   4. Stage 3 CKD - Baseline creatinine 1.5 - 1.6. Was elevated to 2.17 by recent labs. Will reduce Torsemide as outlined above and obtain a repeat BMET in 2 weeks.   5. HTN - BP was well-controlled at 117/68 during today's visit. Continue current medication regimen with Nadolol 40mg  daily and Spironolactone 100mg  daily.   6. HLD - LDL was at 55 in 05/2019. He remains on Pravastatin 20mg  daily.    Medication Adjustments/Labs and Tests Ordered: Current medicines are reviewed at length with the patient today.  Concerns regarding medicines are outlined above.  Medication changes, Labs and Tests ordered today are listed in the Patient Instructions below. Patient Instructions  Medication Instructions:  Your physician has recommended you make the following change in your medication:   Decrease Torsemide to 40 mg Two Times Daily  Decrease KDur to 20 meq Daily    *If you  need a refill on your cardiac medications before your next appointment, please call your pharmacy*   Lab Work: Your physician recommends that you return for lab work in: 2 Weeks   If you have labs (blood work) drawn today and your tests are completely normal, you will receive your results only by: [name] MyChart Message (if you have MyChart) OR . A paper copy in the mail If you have any lab test that is abnormal or we need to change your treatment, we will call you to review the results.   Testing/Procedures: NONE    Follow-Up: At [facility], you and your health needs are our priority.  As part of our continuing mission to provide you with exceptional heart care, we have created designated Provider Care Teams.  These Care Teams include your primary Cardiologist (physician) and Advanced Practice Providers (APPs -  Physician Assistants and Nurse Practitioners) who all work together to provide you with the care you need, when you need it.  We recommend signing up for the patient portal called "MyChart".  Sign up information is provided on this After Visit Summary.  MyChart is used to connect with patients for Virtual Visits (Telemedicine).  Patients are able to view lab/test results, encounter notes, upcoming appointments, etc.  Non-urgent messages can be sent to your provider as well.  To learn more about what you can do with MyChart, go to [email or web address].    Your next appointment:   2 month(s)  The format for your next appointment:   In Person  Provider:   Rozann Lesches, MD or Bernerd Pho, PA-C   Other Instructions Thank you for choosing [facility]!       Signed, Erma Heritage, PA-C  07/12/2020 7:50 AM    [place] [address] [place], [place] [address] Phone: [phone] Fax: [phone]

## 2020-07-12 NOTE — Assessment & Plan Note (Addendum)
59 year old male with history of anemia at least since 2016 with diagnosis of iron deficiency in January 2020 starting on oral iron at that time.  FOBT negative in February 2020.  He has been followed by oncology/hematology with prior work-up including bone marrow biopsy in February 2020 without identified etiology.  He does have polyclonal gammopathy and oncology felt anemia could be related to autoimmune conditions with recommendations to see allergist/rheumatology.  Celiac serologies negative.  EGD in December 2020 with 4 columns of grade 2-3 esophageal varices, portal gastropathy, gastric polyp/abnormal gastric mucosa biopsied.  Pathology with hyperplastic polyp and mild chronic gastritis, negative H. pylori.  Colonoscopy in December 2020 with 3 tubular adenomas and 1 hyperplastic polyp, findings suggestive of portal colopathy, rectal varices.  Notably, colonoscopy prep was poor and was reattempted in April 2020, but prep was again poor.  He is without overt GI bleeding.  Notably, he does have significant thrombocytopenia.  During hospitalization in July 2021, hemoglobin drifted down to 8.5 without overt GI bleeding and improved to 10.2 by discharge with no intervention.   Anemia is likely multifactorial in the setting of chronic disease, CKD, and cirrhosis with portal gastropathy/portal colopathy. Unable to rule out other colonic etiology due to unsuccessful colonoscopy attempts locally.  He has been referred to [facility] for cirrhosis and we have also recommended colonoscopy there as well. Could consider givens capsule if needed in the future to evaluate his small bowel should his hemoglobin begin to trend down and his colonoscopy is unrevealing. Will hold off on this for now.   Plan:  Repeat CBC to ensure stability since hospital discharge.  Continue nadolol for esophageal variceal bleeding prophylaxis. We are trying to titrate this to achieve goal HR of 55-60 (see esophageal varices above).  Colonoscopy at  [facility] to follow with hematology/oncology Advised he monitor for brbpr or melena and let us know.   Follow-up in 3 months.

## 2020-07-12 NOTE — Assessment & Plan Note (Addendum)
59 year old male recently diagnosed with cirrhosis in Jan/Feb 2021 likely secondary to NAFLD/NASH.  He does report drinking heavily prior to 1982.  Associated significant thrombocytopenia.  Meld was 11 in January 2021 and patient was without symptoms of decompensated liver disease.  Unfortunately, he was hospitalized in July 2021 with volume overload/anasarca that was multifactorial in the setting of cirrhosis and acute on chronic diastolic CHF.  He had slight hepatic encephalopathy during admission which improved on lactulose.  Diuretics were managed by cardiology and he diuresed over 30L during hospitalization.  Meld increased to max of 20 during hospitalization which was driven by worsening kidney function, INR up to 1.7, and bilirubin up to 2.  Kidney function had returned to baseline around 1.56 at discharge. Per cardiology's instruction, he was discharged on Demadex 60 mg twice daily and Aldactone 100 mg daily.  Advised to consume low more than 2 L of fluid daily.  He is clinically doing very well today.  Anasarca has resolved and he has minimal lower extremity pitting edema.  Down a total of 69 pounds since day of hospital admission (7/9 ).  No encephalopathy on lactulose.  Continues to take nadolol 20 mg.  Heart rate is not at goal today (72 bpm).  Per patient, heart rates typically in the 70s at home and blood pressure in the 120s/65-70. No obvious bruising or bleeding.  He has appointment with [facility] on 07/15/2020.  Unknown immunity status to Hep A/B.  Plan: 1.  Update labs including CBC, CMP, INR.  Will recalculate meld. 2.  Check hepatitis A antibody total and hepatitis B surface antibody 3.  Try increasing nadolol to 40 mg daily.  Requested patient keep log of blood pressures twice daily x1 week and return log to our office.  Advised if he had any blood pressures below 90/60 or felt lightheaded, dizzy, or like he would pass out, he should let us know immediately. 4.  Continue torsemide 60 mg twice  daily (per cardiology) and Aldactone 100 mg daily 5.  Strict low-sodium diet.  As per cardiology recommendations, no more than 1800 mg daily. 6.  Continue lactulose 30 mL daily with goal of 3-4 BMs daily. 7.  No more than 2000 mg of Tylenol in 24 hours. 8.  Keep appointment with [facility]. 9.  Follow-up in 3 months or sooner if needed.

## 2020-07-12 NOTE — Assessment & Plan Note (Signed)
59 year old male recently diagnosed with cirrhosis with 4 columns of grade 2-3 esophageal varices noted on EGD in December 2020.  He is currently on nadolol 20 mg.  Heart rate not at goal.  Pulse is 73 today, blood pressure 116/73.  Per patient, he checks his blood pressures regularly at home and blood pressure is typically in the 120s/65-70 and pulse is usually in the 70s.    We will try to titrate nadolol up to 40 mg to achieve heart rate goal of 55-60 bpm.  I have advised patient to check his blood pressure and heart rate twice daily for the next week and keep a log of the readings.  Advised if he has any blood pressure readings below 90/60 or feels lightheaded, dizzy, or like he may pass out, he should let us know immediately.  I have requested he bring blood pressure log back to our office in 1 week.  I have also asked nursing staff to follow-up with patient in 1 week in the instance that he does not remember to come by the office.

## 2020-07-14 DIAGNOSIS — K7581 Nonalcoholic steatohepatitis (NASH): Secondary | ICD-10-CM | POA: Diagnosis not present

## 2020-07-14 DIAGNOSIS — N1831 Chronic kidney disease, stage 3a: Secondary | ICD-10-CM | POA: Diagnosis not present

## 2020-07-14 DIAGNOSIS — G4733 Obstructive sleep apnea (adult) (pediatric): Secondary | ICD-10-CM | POA: Diagnosis not present

## 2020-07-14 DIAGNOSIS — E876 Hypokalemia: Secondary | ICD-10-CM | POA: Diagnosis not present

## 2020-07-14 DIAGNOSIS — E1122 Type 2 diabetes mellitus with diabetic chronic kidney disease: Secondary | ICD-10-CM | POA: Diagnosis not present

## 2020-07-14 DIAGNOSIS — I5033 Acute on chronic diastolic (congestive) heart failure: Secondary | ICD-10-CM | POA: Diagnosis not present

## 2020-07-14 DIAGNOSIS — K7290 Hepatic failure, unspecified without coma: Secondary | ICD-10-CM | POA: Diagnosis not present

## 2020-07-14 DIAGNOSIS — K7460 Unspecified cirrhosis of liver: Secondary | ICD-10-CM | POA: Diagnosis not present

## 2020-07-14 DIAGNOSIS — M1991 Primary osteoarthritis, unspecified site: Secondary | ICD-10-CM | POA: Diagnosis not present

## 2020-07-14 DIAGNOSIS — N179 Acute kidney failure, unspecified: Secondary | ICD-10-CM | POA: Diagnosis not present

## 2020-07-14 DIAGNOSIS — I350 Nonrheumatic aortic (valve) stenosis: Secondary | ICD-10-CM | POA: Diagnosis not present

## 2020-07-14 DIAGNOSIS — E1165 Type 2 diabetes mellitus with hyperglycemia: Secondary | ICD-10-CM | POA: Diagnosis not present

## 2020-07-14 DIAGNOSIS — I2510 Atherosclerotic heart disease of native coronary artery without angina pectoris: Secondary | ICD-10-CM | POA: Diagnosis not present

## 2020-07-14 DIAGNOSIS — E785 Hyperlipidemia, unspecified: Secondary | ICD-10-CM | POA: Diagnosis not present

## 2020-07-14 DIAGNOSIS — I130 Hypertensive heart and chronic kidney disease with heart failure and stage 1 through stage 4 chronic kidney disease, or unspecified chronic kidney disease: Secondary | ICD-10-CM | POA: Diagnosis not present

## 2020-07-14 DIAGNOSIS — D631 Anemia in chronic kidney disease: Secondary | ICD-10-CM | POA: Diagnosis not present

## 2020-07-15 DIAGNOSIS — K7460 Unspecified cirrhosis of liver: Secondary | ICD-10-CM | POA: Diagnosis not present

## 2020-07-15 DIAGNOSIS — N189 Chronic kidney disease, unspecified: Secondary | ICD-10-CM | POA: Diagnosis not present

## 2020-07-15 DIAGNOSIS — G8929 Other chronic pain: Secondary | ICD-10-CM | POA: Diagnosis not present

## 2020-07-15 DIAGNOSIS — Z951 Presence of aortocoronary bypass graft: Secondary | ICD-10-CM | POA: Diagnosis not present

## 2020-07-15 DIAGNOSIS — Z794 Long term (current) use of insulin: Secondary | ICD-10-CM | POA: Diagnosis not present

## 2020-07-15 DIAGNOSIS — E1122 Type 2 diabetes mellitus with diabetic chronic kidney disease: Secondary | ICD-10-CM | POA: Diagnosis not present

## 2020-07-15 DIAGNOSIS — I8500 Esophageal varices without bleeding: Secondary | ICD-10-CM | POA: Diagnosis not present

## 2020-07-15 DIAGNOSIS — K7290 Hepatic failure, unspecified without coma: Secondary | ICD-10-CM | POA: Diagnosis not present

## 2020-07-15 DIAGNOSIS — I129 Hypertensive chronic kidney disease with stage 1 through stage 4 chronic kidney disease, or unspecified chronic kidney disease: Secondary | ICD-10-CM | POA: Diagnosis not present

## 2020-07-15 DIAGNOSIS — I714 Abdominal aortic aneurysm, without rupture: Secondary | ICD-10-CM | POA: Diagnosis not present

## 2020-07-15 DIAGNOSIS — R161 Splenomegaly, not elsewhere classified: Secondary | ICD-10-CM | POA: Diagnosis not present

## 2020-07-15 DIAGNOSIS — K766 Portal hypertension: Secondary | ICD-10-CM | POA: Diagnosis not present

## 2020-07-15 DIAGNOSIS — R188 Other ascites: Secondary | ICD-10-CM | POA: Diagnosis not present

## 2020-07-15 DIAGNOSIS — E8809 Other disorders of plasma-protein metabolism, not elsewhere classified: Secondary | ICD-10-CM | POA: Diagnosis not present

## 2020-07-15 DIAGNOSIS — Z79899 Other long term (current) drug therapy: Secondary | ICD-10-CM | POA: Diagnosis not present

## 2020-07-16 ENCOUNTER — Telehealth: Payer: Self-pay

## 2020-07-16 NOTE — Telephone Encounter (Signed)
At this point, he needs to decrease Nadolol back to 20 mg daily as his blood pressures are getting a little too low. We will just need to repeat his EGDs a little more frequently to keep an eye on the esophageal varices.

## 2020-07-16 NOTE — Telephone Encounter (Signed)
Noted. Spoke with pt. Pt is going to decrease Nadolol to 20 mg. Pt will continue to monitor his BP and f/u as directed.

## 2020-07-16 NOTE — Telephone Encounter (Signed)
Spoke with pt s/p apt on 07/09/20. Pt was starting Nadolol 40 mg daily.  Pt's BP yesterday 07/15/20 was 90/60 P-78, today 07/16/20 95/62 p 78.

## 2020-07-17 ENCOUNTER — Other Ambulatory Visit: Payer: Self-pay

## 2020-07-17 ENCOUNTER — Inpatient Hospital Stay (HOSPITAL_COMMUNITY): Payer: Federal, State, Local not specified - PPO | Attending: Hematology | Admitting: Nurse Practitioner

## 2020-07-17 DIAGNOSIS — I2510 Atherosclerotic heart disease of native coronary artery without angina pectoris: Secondary | ICD-10-CM | POA: Insufficient documentation

## 2020-07-17 DIAGNOSIS — K7460 Unspecified cirrhosis of liver: Secondary | ICD-10-CM

## 2020-07-17 DIAGNOSIS — Z7982 Long term (current) use of aspirin: Secondary | ICD-10-CM | POA: Diagnosis not present

## 2020-07-17 DIAGNOSIS — N183 Chronic kidney disease, stage 3 unspecified: Secondary | ICD-10-CM | POA: Diagnosis not present

## 2020-07-17 DIAGNOSIS — I130 Hypertensive heart and chronic kidney disease with heart failure and stage 1 through stage 4 chronic kidney disease, or unspecified chronic kidney disease: Secondary | ICD-10-CM | POA: Diagnosis not present

## 2020-07-17 DIAGNOSIS — I5032 Chronic diastolic (congestive) heart failure: Secondary | ICD-10-CM | POA: Insufficient documentation

## 2020-07-17 DIAGNOSIS — E785 Hyperlipidemia, unspecified: Secondary | ICD-10-CM | POA: Insufficient documentation

## 2020-07-17 DIAGNOSIS — Z794 Long term (current) use of insulin: Secondary | ICD-10-CM | POA: Diagnosis not present

## 2020-07-17 DIAGNOSIS — D631 Anemia in chronic kidney disease: Secondary | ICD-10-CM | POA: Insufficient documentation

## 2020-07-17 DIAGNOSIS — M129 Arthropathy, unspecified: Secondary | ICD-10-CM | POA: Diagnosis not present

## 2020-07-17 DIAGNOSIS — R601 Generalized edema: Secondary | ICD-10-CM

## 2020-07-17 DIAGNOSIS — R161 Splenomegaly, not elsewhere classified: Secondary | ICD-10-CM | POA: Diagnosis not present

## 2020-07-17 DIAGNOSIS — E119 Type 2 diabetes mellitus without complications: Secondary | ICD-10-CM | POA: Diagnosis not present

## 2020-07-17 DIAGNOSIS — G4730 Sleep apnea, unspecified: Secondary | ICD-10-CM | POA: Insufficient documentation

## 2020-07-17 DIAGNOSIS — D696 Thrombocytopenia, unspecified: Secondary | ICD-10-CM | POA: Diagnosis not present

## 2020-07-17 DIAGNOSIS — I509 Heart failure, unspecified: Secondary | ICD-10-CM | POA: Insufficient documentation

## 2020-07-17 DIAGNOSIS — Z79899 Other long term (current) drug therapy: Secondary | ICD-10-CM | POA: Diagnosis not present

## 2020-07-17 NOTE — Assessment & Plan Note (Signed)
1. Moderate thrombocytopenia: -Thrombocytopenia since 2016.  Platelets between 55 and 90 since 2018. -BMBX on 01/26/2019 showed slightly hypercellular marrow for age with trilineage hematopoiesis.  No significant dyspoiesis or increase in blasts.  Plasma cells are slightly increased in number but with polyclonal staining pattern.  Abundant megakaryocytes with scattered small hypolobulated forms.  Chromosome analysis was normal.  Biopsy was done for work-up of increased IgA levels. -Ultrasound abdomen on 01/15/2020 showed nodular contour of the liver consistent with cirrhosis.  Splenomegaly with volume of 1562 mL. -Work-up on 1/57/2620 showed B55, folic acid, methylmalonic acid and copper levels were normal.  SPEP was negative.  CBC showed platelet count of 66.  LDH was normal.  ANA was negative.  Rheumatoid factor was positive at 58.6. -Thrombocytopenia secondary to splenomegaly.  There also might be a component of immune mediated thrombocytopenia.  We can give a trial of dexamethasone for 4 days if needed. -She was referred to [facility] liver for transplant team.  He is currently on the active list for liver transplant with [facility]. -She also needs a knee replacement which will come after his liver transplant. -Labs done on 07/10/2020 showed platelet count of 93 -We will see him back in 4 months with repeat labs.  2.  Normocytic to macrocytic anemia: -Most likely from CKD. -Colonoscopy on 03/17/2020 showed an adequate preparation of the colon with rectal varices.  Much of the colon cannot be seen. -He received Feraheme infusions on 04/25/2020 and 05/02/2020. -Labs done on 07/10/2020 showed hemoglobin of 12.0, ferritin of 291, percent saturation of 35. -He does not need an iron transfusion at this time. -She will follow-up in 4 months with repeat labs.  3.  Positive rheumatoid factor: -Work-up for thrombocytopenia include rheumatoid factor positive at 58.6.  ANA was negative. -He does not have any  clinical signs or symptoms of rheumatoid arthritis.

## 2020-07-17 NOTE — Progress Notes (Signed)
[place],  [id number]   CLINIC:  Medical Oncology/Hematology  PCP:  [name], [place] [address] [phone]   REASON FOR VISIT: Follow-up for thrombocytopenia   CURRENT THERAPY: Observation   INTERVAL HISTORY:  [name] 59 y.o. male returns for routine follow-up for thrombocytopenia.  Patient reports he is doing well since his last visit.  He is being seen by [facility] and currently on the transplant list for liver.  He follows up with them regularly.  He denies any petechiae.  He denies any issues with bleeding. Denies any nausea, vomiting, or diarrhea. Denies any new pains. Had not noticed any recent bleeding such as epistaxis, hematuria or hematochezia. Denies recent chest pain on exertion, shortness of breath on minimal exertion, pre-syncopal episodes, or palpitations. Denies any numbness or tingling in hands or feet. Denies any recent fevers, infections, or recent hospitalizations. Patient reports appetite at 75% and energy level at 75%.  He is eating well maintain his weight at this time.     REVIEW OF SYSTEMS:  Review of Systems  All other systems reviewed and are negative.    PAST MEDICAL/SURGICAL HISTORY:  Past Medical History:  Diagnosis Date  . Anemia   . Arthritis   . CAD (coronary artery disease)    Multivessel disease status post CABG 08/2015  . CKD (chronic kidney disease) stage 3, GFR 30-59 ml/min   . Diastolic CHF ([place])   . Essential hypertension   . Hyperlipidemia   . OSA on CPAP   . Polyclonal gammopathy   . Thrombocytopenia ([place]) 2016  . Type 2 diabetes mellitus ([place])    Past Surgical History:  Procedure Laterality Date  . APPENDECTOMY    . Biceps tendon surgery Right   . BIOPSY  11/22/2019   Procedure: BIOPSY;  Surgeon: Daneil Dolin, MD;  [facility];  Service: Endoscopy;;  . CARDIAC CATHETERIZATION N/A 08/25/2015   Procedure: Left  Heart Cath and Coronary Angiography;  Surgeon: Belva Crome, MD;  Location: [place] CV LAB;  Service: Cardiovascular;  Laterality: N/A;  . COLONOSCOPY WITH PROPOFOL N/A 11/22/2019   Procedure: COLONOSCOPY WITH PROPOFOL;  Surgeon: Daneil Dolin, MD; Four 4-5 mm polyps, findings suggestive of portal colopathy, congested appearing colonic mucosa diffusely, rectal varices, and adequate right colon prep.  Pathology with tubular adenomas and hyperplastic polyp.  Right colon biopsy with focal active colitis.  Recommendations to repeat colonoscopy in 3 months due to poor prep.  . COLONOSCOPY WITH PROPOFOL N/A 03/17/2020   Procedure: COLONOSCOPY WITH PROPOFOL;  Surgeon: Daneil Dolin, MD;  Location: [facility];  Service: Endoscopy;  Laterality: N/A;  8:45am - pt does not need covid test, was + 2/4 <90 days  . CORONARY ARTERY BYPASS GRAFT N/A 08/29/2015   Procedure: CORONARY ARTERY BYPASS GRAFTING (CABG);  Surgeon: [name], MD;  Location: [place];  Service: Open Heart Surgery;  Laterality: N/A;  . ESOPHAGOGASTRODUODENOSCOPY (EGD) WITH PROPOFOL N/A 11/22/2019   Procedure: ESOPHAGOGASTRODUODENOSCOPY (EGD) WITH PROPOFOL;  Surgeon: Daneil Dolin, MD; 4 columns of grade 2-3 esophageal varices, portal gastropathy, gastric polyp/abnormal gastric mucosa s/p biopsy.  Pathology with hyperplastic polyp, mild chronic gastritis, negative H. pylori.  [name] KNEE ARTHROSCOPY Left   . TEE WITHOUT CARDIOVERSION N/A 08/29/2015   Procedure: TRANSESOPHAGEAL ECHOCARDIOGRAM (TEE);  Surgeon: [name], MD;  Location: [place];  Service: Open Heart Surgery;  Laterality: N/A;  . TOTAL  KNEE ARTHROPLASTY Left 03/09/2017   Procedure: TOTAL KNEE ARTHROPLASTY;  Surgeon: Carole Civil, MD;  Location: [facility] ORS;  Service: Orthopedics;  Laterality: Left;     SOCIAL HISTORY:  Social History   Socioeconomic History  . Marital status: Married    Spouse name: Not on file  . Number of children: 0  . Years of  education: college  . Highest education level: Not on file  Occupational History  . Occupation: Maintenance tech    Employer: [employer]  Tobacco Use  . Smoking status: Never Smoker  . Smokeless tobacco: Never Used  Vaping Use  . Vaping Use: Never used  Substance and Sexual Activity  . Alcohol use: No    Alcohol/week: 0.0 standard drinks  . Drug use: No  . Sexual activity: Not Currently  Other Topics Concern  . Not on file  Social History Narrative  . Not on file   Social Determinants of Health   Financial Resource Strain:   . Difficulty of Paying Living Expenses:   Food Insecurity:   . Worried About Charity fundraiser in the Last Year:   . Arboriculturist in the Last Year:   Transportation Needs:   . Film/video editor (Medical):   [name] Lack of Transportation (Non-Medical):   Physical Activity:   . Days of Exercise per Week:   . Minutes of Exercise per Session:   Stress:   . Feeling of Stress :   Social Connections:   . Frequency of Communication with Friends and Family:   . Frequency of Social Gatherings with Friends and Family:   . Attends Religious Services:   . Active Member of Clubs or Organizations:   . Attends Archivist Meetings:   [name] Marital Status:   Intimate Partner Violence:   . Fear of Current or Ex-Partner:   . Emotionally Abused:   [name] Physically Abused:   . Sexually Abused:     FAMILY HISTORY:  Family History  Problem Relation Age of Onset  . Arthritis Other   . Cancer Other   . Diabetes Other   . CAD Father   . Diabetes Mellitus II Father   . Liver cancer Father   . Hodgkin's lymphoma Father   . CAD Brother   . Diabetes Mellitus II Brother   . ALS Mother   . Diabetes Mellitus II Sister   . Diabetes Mellitus II Brother   . Diabetes Mellitus II Maternal Grandmother   . Aneurysm Maternal Grandmother   . Cancer Maternal Grandfather   . Anesthesia problems Neg Hx   . Hypotension Neg Hx   . Malignant hyperthermia Neg Hx     . Pseudochol deficiency Neg Hx   . Colon cancer Neg Hx     CURRENT MEDICATIONS:  Outpatient Encounter Medications as of 07/17/2020  Medication Sig  . amoxicillin-clavulanate (AUGMENTIN) 875-125 MG tablet Take 1 tablet by mouth 2 (two) times daily.  . Armodafinil 200 MG TABS Take 200 mg by mouth daily as needed (for sleepiness).  [name] aspirin EC 81 MG tablet Take 81 mg by mouth at bedtime.   . Cholecalciferol (DIALYVITE VITAMIN D 5000) 125 MCG (5000 UT) capsule Take 5,000 Units by mouth in the morning.   [name] Bandages & Supports (MEDICAL COMPRESSION STOCKINGS) MISC 1 each by Does not apply route as directed. Low pressure knee high compression stockings Dx: leg swelling  . ELDERBERRY PO Take 1 capsule by mouth at bedtime. With  Vitamin c  and zinc   . ferrous sulfate 325 (65 FE) MG tablet Take 325 mg by mouth in the morning.   . Flaxseed, Linseed, (FLAXSEED OIL PO) Take 1,400 mg by mouth at bedtime.   . insulin aspart (NOVOLOG) 100 UNIT/ML injection Inject 5-30 Units into the skin 3 (three) times daily before meals.   . lactulose (CHRONULAC) 10 GM/15ML solution Take 30 mLs (20 g total) by mouth daily.  [name] loratadine (CLARITIN) 10 MG tablet Take 10 mg by mouth in the morning.   . Magnesium 250 MG TABS Take 250 mg by mouth daily.  . nadolol (CORGARD) 40 MG tablet Take 1 tablet (40 mg total) by mouth daily.  [name] omeprazole (PRILOSEC) 40 MG capsule Take 40 mg by mouth at bedtime.  . potassium chloride SA (KLOR-CON) 20 MEQ tablet Take 1 tablet (20 mEq total) by mouth daily.  . pravastatin (PRAVACHOL) 20 MG tablet TAKE 1 TABLET(20 MG) BY MOUTH AT BEDTIME  . spironolactone (ALDACTONE) 100 MG tablet Take 1 tablet (100 mg total) by mouth daily.  [name] torsemide (DEMADEX) 20 MG tablet Take 2 tablets (40 mg total) by mouth 2 (two) times daily.  . traMADol-acetaminophen (ULTRACET) 37.5-325 MG tablet Take 1 tablet by mouth every 4 (four) hours as needed for severe pain.  . TRESIBA FLEXTOUCH 200 UNIT/ML SOPN  Inject 40-70 Units into the skin See admin instructions. Inject 70 units into the skin in the morning and 40 units at bedtime  . TURMERIC CURCUMIN PO Take 2,000 mg by mouth at bedtime.   . [DISCONTINUED] nadolol (CORGARD) 20 MG tablet Take by mouth.  [name] albuterol (VENTOLIN HFA) 108 (90 Base) MCG/ACT inhaler Inhale 2 puffs into the lungs every 6 (six) hours as needed for wheezing or shortness of breath. (Patient not taking: Reported on 07/17/2020)  . [DISCONTINUED] hydrochlorothiazide (HYDRODIURIL) 25 MG tablet Take 12.5 mg by mouth daily.  . [DISCONTINUED] olmesartan (BENICAR) 40 MG tablet Take 40 mg by mouth daily.   No facility-administered encounter medications on file as of 07/17/2020.    ALLERGIES:  No Known Allergies   PHYSICAL EXAM:  ECOG Performance status: 1  Vitals:   07/17/20 0855  BP: (!) 114/59  Pulse: 64  Resp: 18  Temp: (!) 97 F (36.1 C)  SpO2: 99%   Filed Weights   07/17/20 0855  Weight: 244 lb 7.8 oz (110.9 kg)   Physical Exam Constitutional:      Appearance: Normal appearance. He is normal weight.  Cardiovascular:     Rate and Rhythm: Normal rate and regular rhythm.     Heart sounds: Normal heart sounds.  Pulmonary:     Effort: Pulmonary effort is normal.     Breath sounds: Normal breath sounds.  Abdominal:     General: Bowel sounds are normal.     Palpations: Abdomen is soft.  Musculoskeletal:        General: Normal range of motion.  Skin:    General: Skin is warm.  Neurological:     Mental Status: He is alert and oriented to person, place, and time. Mental status is at baseline.  Psychiatric:        Mood and Affect: Mood normal.        Behavior: Behavior normal.        Thought Content: Thought content normal.        Judgment: Judgment normal.      LABORATORY DATA:  I have reviewed the labs as listed.  CBC    Component Value  Date/Time   WBC 5.5 07/10/2020 1337   RBC 3.59 (L) 07/10/2020 1337   HGB 12.0 (L) 07/10/2020 1337   HGB 12.5 (L)  05/03/2019 1005   HGB 10.1 (L) 12/15/2018 1525   HCT 35.5 (L) 07/10/2020 1337   HCT 30.2 (L) 12/15/2018 1525   PLT 92 (L) 07/10/2020 1337   PLT 72 (L) 05/03/2019 1005   PLT 87 (LL) 12/15/2018 1525   MCV 98.9 07/10/2020 1337   MCV 84 12/15/2018 1525   MCH 33.4 07/10/2020 1337   MCHC 33.8 07/10/2020 1337   RDW 13.4 07/10/2020 1337   RDW 15.1 12/15/2018 1525   LYMPHSABS 0.9 07/10/2020 1337   LYMPHSABS 1.5 12/15/2018 1525   MONOABS 0.5 07/10/2020 1337   EOSABS 0.2 07/10/2020 1337   EOSABS 0.1 12/15/2018 1525   BASOSABS 0.0 07/10/2020 1337   BASOSABS 0.0 12/15/2018 1525   CMP Latest Ref Rng & Units 07/10/2020 07/01/2020 06/30/2020  Glucose 70 - 99 mg/dL 276(H) 101(H) 98  BUN 6 - 20 mg/dL 49(H) 38(H) 38(H)  Creatinine 0.61 - 1.24 mg/dL 2.17(H) 1.56(H) 1.65(H)  Sodium 135 - 145 mmol/L 134(L) 138 136  Potassium 3.5 - 5.1 mmol/L 4.7 3.4(L) 3.3(L)  Chloride 98 - 111 mmol/L 99 95(L) 96(L)  CO2 22 - 32 mmol/L 22 29 29   Calcium 8.9 - 10.3 mg/dL 10.2 9.5 9.0  Total Protein 6.5 - 8.1 g/dL 8.9(H) - -  Total Bilirubin 0.3 - 1.2 mg/dL 1.8(H) - -  Alkaline Phos 38 - 126 U/L 63 - -  AST 15 - 41 U/L 29 - -  ALT 0 - 44 U/L 15 - -    All questions were answered to patient's stated satisfaction. Encouraged patient to call with any new concerns or questions before his next visit to the cancer center and we can certain see him sooner, if needed.     ASSESSMENT & PLAN:  Thrombocytopenia ([place]) 1. Moderate thrombocytopenia: -Thrombocytopenia since 2016.  Platelets between 55 and 90 since 2018. -BMBX on 01/26/2019 showed slightly hypercellular marrow for age with trilineage hematopoiesis.  No significant dyspoiesis or increase in blasts.  Plasma cells are slightly increased in number but with polyclonal staining pattern.  Abundant megakaryocytes with scattered small hypolobulated forms.  Chromosome analysis was normal.  Biopsy was done for work-up of increased IgA levels. -Ultrasound abdomen on 01/15/2020  showed nodular contour of the liver consistent with cirrhosis.  Splenomegaly with volume of 1562 mL. -Work-up on 0/93/8182 showed X93, folic acid, methylmalonic acid and copper levels were normal.  SPEP was negative.  CBC showed platelet count of 66.  LDH was normal.  ANA was negative.  Rheumatoid factor was positive at 58.6. -Thrombocytopenia secondary to splenomegaly.  There also might be a component of immune mediated thrombocytopenia.  We can give a trial of dexamethasone for 4 days if needed. -She was referred to [facility] liver for transplant team.  He is currently on the active list for liver transplant with [facility]. -She also needs a knee replacement which will come after his liver transplant. -Labs done on 07/10/2020 showed platelet count of 93 -We will see him back in 4 months with repeat labs.  2.  Normocytic to macrocytic anemia: -Most likely from CKD. -Colonoscopy on 03/17/2020 showed an adequate preparation of the colon with rectal varices.  Much of the colon cannot be seen. -He received Feraheme infusions on 04/25/2020 and 05/02/2020. -Labs done on 07/10/2020 showed hemoglobin of 12.0, ferritin of 291, percent saturation of 35. -He does  not need an iron transfusion at this time. -She will follow-up in 4 months with repeat labs.  3.  Positive rheumatoid factor: -Work-up for thrombocytopenia include rheumatoid factor positive at 58.6.  ANA was negative. -He does not have any clinical signs or symptoms of rheumatoid arthritis.     Orders placed this encounter:  Orders Placed This Encounter  Procedures  . Lactate dehydrogenase  . CBC with Differential/Platelet  . Comprehensive metabolic panel  . Ferritin  . Iron and TIBC  . Vitamin B12  . VITAMIN D 25 Hydroxy (Vit-D Deficiency, Fractures)  . Folate      Francene Finders, FNP-C [place] [phone]

## 2020-07-18 DIAGNOSIS — N1831 Chronic kidney disease, stage 3a: Secondary | ICD-10-CM | POA: Diagnosis not present

## 2020-07-18 DIAGNOSIS — K7460 Unspecified cirrhosis of liver: Secondary | ICD-10-CM | POA: Diagnosis not present

## 2020-07-18 DIAGNOSIS — E1122 Type 2 diabetes mellitus with diabetic chronic kidney disease: Secondary | ICD-10-CM | POA: Diagnosis not present

## 2020-07-18 DIAGNOSIS — N179 Acute kidney failure, unspecified: Secondary | ICD-10-CM | POA: Diagnosis not present

## 2020-07-18 DIAGNOSIS — I130 Hypertensive heart and chronic kidney disease with heart failure and stage 1 through stage 4 chronic kidney disease, or unspecified chronic kidney disease: Secondary | ICD-10-CM | POA: Diagnosis not present

## 2020-07-18 DIAGNOSIS — I2510 Atherosclerotic heart disease of native coronary artery without angina pectoris: Secondary | ICD-10-CM | POA: Diagnosis not present

## 2020-07-18 DIAGNOSIS — M1991 Primary osteoarthritis, unspecified site: Secondary | ICD-10-CM | POA: Diagnosis not present

## 2020-07-18 DIAGNOSIS — E785 Hyperlipidemia, unspecified: Secondary | ICD-10-CM | POA: Diagnosis not present

## 2020-07-18 DIAGNOSIS — E876 Hypokalemia: Secondary | ICD-10-CM | POA: Diagnosis not present

## 2020-07-18 DIAGNOSIS — G4733 Obstructive sleep apnea (adult) (pediatric): Secondary | ICD-10-CM | POA: Diagnosis not present

## 2020-07-18 DIAGNOSIS — K7290 Hepatic failure, unspecified without coma: Secondary | ICD-10-CM | POA: Diagnosis not present

## 2020-07-18 DIAGNOSIS — I350 Nonrheumatic aortic (valve) stenosis: Secondary | ICD-10-CM | POA: Diagnosis not present

## 2020-07-18 DIAGNOSIS — E1165 Type 2 diabetes mellitus with hyperglycemia: Secondary | ICD-10-CM | POA: Diagnosis not present

## 2020-07-18 DIAGNOSIS — I5033 Acute on chronic diastolic (congestive) heart failure: Secondary | ICD-10-CM | POA: Diagnosis not present

## 2020-07-18 DIAGNOSIS — K7581 Nonalcoholic steatohepatitis (NASH): Secondary | ICD-10-CM | POA: Diagnosis not present

## 2020-07-18 DIAGNOSIS — D631 Anemia in chronic kidney disease: Secondary | ICD-10-CM | POA: Diagnosis not present

## 2020-07-21 ENCOUNTER — Telehealth: Payer: Self-pay | Admitting: Cardiology

## 2020-07-21 NOTE — Telephone Encounter (Signed)
Rec'd Short Term Disability forms from Pt. Forms faxed to HIM on 07-21-20/rval

## 2020-07-22 DIAGNOSIS — K7290 Hepatic failure, unspecified without coma: Secondary | ICD-10-CM | POA: Diagnosis not present

## 2020-07-22 DIAGNOSIS — D631 Anemia in chronic kidney disease: Secondary | ICD-10-CM | POA: Diagnosis not present

## 2020-07-22 DIAGNOSIS — I130 Hypertensive heart and chronic kidney disease with heart failure and stage 1 through stage 4 chronic kidney disease, or unspecified chronic kidney disease: Secondary | ICD-10-CM | POA: Diagnosis not present

## 2020-07-22 DIAGNOSIS — N1831 Chronic kidney disease, stage 3a: Secondary | ICD-10-CM | POA: Diagnosis not present

## 2020-07-22 DIAGNOSIS — I2510 Atherosclerotic heart disease of native coronary artery without angina pectoris: Secondary | ICD-10-CM | POA: Diagnosis not present

## 2020-07-22 DIAGNOSIS — E876 Hypokalemia: Secondary | ICD-10-CM | POA: Diagnosis not present

## 2020-07-22 DIAGNOSIS — I5033 Acute on chronic diastolic (congestive) heart failure: Secondary | ICD-10-CM | POA: Diagnosis not present

## 2020-07-22 DIAGNOSIS — N179 Acute kidney failure, unspecified: Secondary | ICD-10-CM | POA: Diagnosis not present

## 2020-07-22 DIAGNOSIS — E1122 Type 2 diabetes mellitus with diabetic chronic kidney disease: Secondary | ICD-10-CM | POA: Diagnosis not present

## 2020-07-22 DIAGNOSIS — E785 Hyperlipidemia, unspecified: Secondary | ICD-10-CM | POA: Diagnosis not present

## 2020-07-22 DIAGNOSIS — K7460 Unspecified cirrhosis of liver: Secondary | ICD-10-CM | POA: Diagnosis not present

## 2020-07-22 DIAGNOSIS — M1991 Primary osteoarthritis, unspecified site: Secondary | ICD-10-CM | POA: Diagnosis not present

## 2020-07-22 DIAGNOSIS — E1165 Type 2 diabetes mellitus with hyperglycemia: Secondary | ICD-10-CM | POA: Diagnosis not present

## 2020-07-22 DIAGNOSIS — K7581 Nonalcoholic steatohepatitis (NASH): Secondary | ICD-10-CM | POA: Diagnosis not present

## 2020-07-22 DIAGNOSIS — G4733 Obstructive sleep apnea (adult) (pediatric): Secondary | ICD-10-CM | POA: Diagnosis not present

## 2020-07-22 DIAGNOSIS — I350 Nonrheumatic aortic (valve) stenosis: Secondary | ICD-10-CM | POA: Diagnosis not present

## 2020-07-24 ENCOUNTER — Encounter: Payer: Federal, State, Local not specified - PPO | Attending: Family Medicine | Admitting: Nutrition

## 2020-07-24 ENCOUNTER — Encounter: Payer: Self-pay | Admitting: Nutrition

## 2020-07-24 ENCOUNTER — Other Ambulatory Visit: Payer: Self-pay

## 2020-07-24 VITALS — Ht 71.0 in | Wt 246.6 lb

## 2020-07-24 DIAGNOSIS — I10 Essential (primary) hypertension: Secondary | ICD-10-CM | POA: Diagnosis not present

## 2020-07-24 DIAGNOSIS — I2510 Atherosclerotic heart disease of native coronary artery without angina pectoris: Secondary | ICD-10-CM | POA: Diagnosis not present

## 2020-07-24 DIAGNOSIS — K7460 Unspecified cirrhosis of liver: Secondary | ICD-10-CM | POA: Diagnosis not present

## 2020-07-24 DIAGNOSIS — E1165 Type 2 diabetes mellitus with hyperglycemia: Secondary | ICD-10-CM | POA: Diagnosis not present

## 2020-07-24 DIAGNOSIS — E669 Obesity, unspecified: Secondary | ICD-10-CM

## 2020-07-24 DIAGNOSIS — K7581 Nonalcoholic steatohepatitis (NASH): Secondary | ICD-10-CM | POA: Diagnosis not present

## 2020-07-24 DIAGNOSIS — E782 Mixed hyperlipidemia: Secondary | ICD-10-CM | POA: Diagnosis not present

## 2020-07-24 DIAGNOSIS — I5022 Chronic systolic (congestive) heart failure: Secondary | ICD-10-CM | POA: Diagnosis not present

## 2020-07-24 NOTE — Patient Instructions (Signed)
  Goals  Follow My Plate Eat 2-3 carb choices per meal Eat 100 g of protein per meal. Read food labels Watch portion sizes. Keep walking and increase as tolerated.

## 2020-07-24 NOTE — Progress Notes (Signed)
Medical Nutrition Therapy:  Appt start time: 0800 end time:  0900.  Assessment:  Primary concerns today: NASH, Cirrohosis, Obesity, DM, CHF. Diabetes Type 2., Got 60 lbs of fluid off rececently and was in the hospital for 12 days. Went from  313 lbs to 270, and now is down to  246 lbs Changes made recently:  Has cut out fried foods,  Eating more fresh fruits and vegetables and no canned foods. Uses Mrs. Dash. Walking a little bit.  Eats 3 meals per day.  Fluid restriction to 1800 -2000 mls per day. FBS this was 111 this am. Had a 67 bs early Monday am. Was sweaty. Drank juice and it came back up and he ate breakfast. Tries to limit his carbohydrates. Novolog 5 units plus sliding scale,  Tresiba 40 at night and 70 units in the morning. Dr. Lauraine Rinne, is his endocrinologist.. Has a Elenor Legato and uses it 5+ times a day. BS are well controlled with A1C of 6.8%. Had a low blood sugar the other morning of 47 mg/dl. He has a tendency to restrict carbs more than necessary. He is aware he should have 100 g of Protein per day as recommended by his GI MD at [facility]. Motivated to continue working on lifestyle changes to improve his health in all areas needed.  CMP Latest Ref Rng & Units 07/10/2020 07/01/2020 06/30/2020  Glucose 70 - 99 mg/dL 276(H) 101(H) 98  BUN 6 - 20 mg/dL 49(H) 38(H) 38(H)  Creatinine 0.61 - 1.24 mg/dL 2.17(H) 1.56(H) 1.65(H)  Sodium 135 - 145 mmol/L 134(L) 138 136  Potassium 3.5 - 5.1 mmol/L 4.7 3.4(L) 3.3(L)  Chloride 98 - 111 mmol/L 99 95(L) 96(L)  CO2 22 - 32 mmol/L 22 29 29   Calcium 8.9 - 10.3 mg/dL 10.2 9.5 9.0  Total Protein 6.5 - 8.1 g/dL 8.9(H) - -  Total Bilirubin 0.3 - 1.2 mg/dL 1.8(H) - -  Alkaline Phos 38 - 126 U/L 63 - -  AST 15 - 41 U/L 29 - -  ALT 0 - 44 U/L 15 - -   Lipid Panel     Component Value Date/Time   CHOL 120 05/25/2019 1001   TRIG 136 05/25/2019 1001   HDL 38 (L) 05/25/2019 1001   CHOLHDL 3.2 05/25/2019 1001   VLDL 27 05/25/2019 1001    LDLCALC 55 05/25/2019 1001   Lab Results  Component Value Date   HGBA1C 6.8 (H) 06/20/2020    Preferred Learning Style:   No preference indicated   Learning Readiness:   Ready  Change in progress   MEDICATIONS:    DIETARY INTAKE:  24-hr recall:  B ( AM): 2 scrambled eggs and 2 slices of dry toast. BS was 57 before meal. Apple juice  Snk ( AM):   L ( PM): Cabbage, squash and green beans, 1 salmon cake, water Snk ( PM):  D ( PM): Vegetarian burrito, water, guamolie  Snk ( PM): blueberry mini muffin, 1 Beverages: water  Usual physical activity: Walks 1/2 mile 3 times per week.  Estimated energy needs: 1600 -1800  calories 200  g carbohydrates 100 g protein 50 g fat  Progress Towards Goal(s):  In progress.   Nutritional Diagnosis:  NB-1.1 Food and nutrition-related knowledge deficit As related to NASH, DM and Excessive calories.  As evidenced by Cirrhosis, A1C 6.8 and BMI 34 after losing 60+ lbs of fluid..    Intervention:  Nutrition and Diabetes education provided on My Plate, CHO counting, meal planning, portion  sizes, timing of meals, avoiding snacks between meals unless having a low blood sugar, target ranges for A1C and blood sugars, signs/symptoms and treatment of hyper/hypoglycemia, monitoring blood sugars, taking medications as prescribed, benefits of exercising 30 minutes per day and prevention of complications of DM.    Goals  Follow My Plate Eat 2-3 carb choices per meal Eat 100 g of protein per meal. Read food labels Watch portion sizes. Keep walking and increase as tolerated.  Teaching Method Utilized:  Visual Auditory Hands on  Handouts given during visit include:  The Plate Method   Meal Plan Card  S/S of hyper/hypoglycemia  Low salt heart healty diet.  Barriers to learning/adherence to lifestyle change: CHF and deconditioning  Demonstrated degree of understanding via:  Teach Back   Monitoring/Evaluation:  Dietary intake, exercise,  , and body weight in 1 month(s). Would recommend to reduce long acting insulin by 10% having had episodes of hypoglycemia. Would recommend to combine the amount of Tresiba to be given once  A day instead of 2 shots per day with the 10% decrease in TDD or more if needed.[name]

## 2020-07-28 DIAGNOSIS — G4733 Obstructive sleep apnea (adult) (pediatric): Secondary | ICD-10-CM | POA: Diagnosis not present

## 2020-07-28 DIAGNOSIS — N179 Acute kidney failure, unspecified: Secondary | ICD-10-CM | POA: Diagnosis not present

## 2020-07-28 DIAGNOSIS — I5033 Acute on chronic diastolic (congestive) heart failure: Secondary | ICD-10-CM | POA: Diagnosis not present

## 2020-07-28 DIAGNOSIS — E1165 Type 2 diabetes mellitus with hyperglycemia: Secondary | ICD-10-CM | POA: Diagnosis not present

## 2020-07-28 DIAGNOSIS — E785 Hyperlipidemia, unspecified: Secondary | ICD-10-CM | POA: Diagnosis not present

## 2020-07-28 DIAGNOSIS — M1991 Primary osteoarthritis, unspecified site: Secondary | ICD-10-CM | POA: Diagnosis not present

## 2020-07-28 DIAGNOSIS — I130 Hypertensive heart and chronic kidney disease with heart failure and stage 1 through stage 4 chronic kidney disease, or unspecified chronic kidney disease: Secondary | ICD-10-CM | POA: Diagnosis not present

## 2020-07-28 DIAGNOSIS — K7290 Hepatic failure, unspecified without coma: Secondary | ICD-10-CM | POA: Diagnosis not present

## 2020-07-28 DIAGNOSIS — K7581 Nonalcoholic steatohepatitis (NASH): Secondary | ICD-10-CM | POA: Diagnosis not present

## 2020-07-28 DIAGNOSIS — I2510 Atherosclerotic heart disease of native coronary artery without angina pectoris: Secondary | ICD-10-CM | POA: Diagnosis not present

## 2020-07-28 DIAGNOSIS — N1831 Chronic kidney disease, stage 3a: Secondary | ICD-10-CM | POA: Diagnosis not present

## 2020-07-28 DIAGNOSIS — D631 Anemia in chronic kidney disease: Secondary | ICD-10-CM | POA: Diagnosis not present

## 2020-07-28 DIAGNOSIS — K7460 Unspecified cirrhosis of liver: Secondary | ICD-10-CM | POA: Diagnosis not present

## 2020-07-28 DIAGNOSIS — I350 Nonrheumatic aortic (valve) stenosis: Secondary | ICD-10-CM | POA: Diagnosis not present

## 2020-07-28 DIAGNOSIS — E876 Hypokalemia: Secondary | ICD-10-CM | POA: Diagnosis not present

## 2020-07-28 DIAGNOSIS — E1122 Type 2 diabetes mellitus with diabetic chronic kidney disease: Secondary | ICD-10-CM | POA: Diagnosis not present

## 2020-07-29 ENCOUNTER — Encounter: Payer: Self-pay | Admitting: Student

## 2020-07-30 DIAGNOSIS — Z0279 Encounter for issue of other medical certificate: Secondary | ICD-10-CM

## 2020-07-30 NOTE — Telephone Encounter (Signed)
Form completed and picked up on 07/30/20 . [facility] 07/30/20 )

## 2020-08-05 ENCOUNTER — Telehealth: Payer: Self-pay | Admitting: Internal Medicine

## 2020-08-05 NOTE — Telephone Encounter (Signed)
Called pt and made aware don't see where we referred pt to a kidney specialists

## 2020-08-05 NOTE — Telephone Encounter (Signed)
Patient called asking if we referred him to the kidney doctor, if so he can not remember the name and number

## 2020-08-06 DIAGNOSIS — I509 Heart failure, unspecified: Secondary | ICD-10-CM | POA: Diagnosis not present

## 2020-08-07 DIAGNOSIS — M1991 Primary osteoarthritis, unspecified site: Secondary | ICD-10-CM | POA: Diagnosis not present

## 2020-08-07 DIAGNOSIS — E876 Hypokalemia: Secondary | ICD-10-CM | POA: Diagnosis not present

## 2020-08-07 DIAGNOSIS — E785 Hyperlipidemia, unspecified: Secondary | ICD-10-CM | POA: Diagnosis not present

## 2020-08-07 DIAGNOSIS — N1831 Chronic kidney disease, stage 3a: Secondary | ICD-10-CM | POA: Diagnosis not present

## 2020-08-07 DIAGNOSIS — D631 Anemia in chronic kidney disease: Secondary | ICD-10-CM | POA: Diagnosis not present

## 2020-08-07 DIAGNOSIS — G4733 Obstructive sleep apnea (adult) (pediatric): Secondary | ICD-10-CM | POA: Diagnosis not present

## 2020-08-07 DIAGNOSIS — I2510 Atherosclerotic heart disease of native coronary artery without angina pectoris: Secondary | ICD-10-CM | POA: Diagnosis not present

## 2020-08-07 DIAGNOSIS — K7460 Unspecified cirrhosis of liver: Secondary | ICD-10-CM | POA: Diagnosis not present

## 2020-08-07 DIAGNOSIS — I5033 Acute on chronic diastolic (congestive) heart failure: Secondary | ICD-10-CM | POA: Diagnosis not present

## 2020-08-07 DIAGNOSIS — K7290 Hepatic failure, unspecified without coma: Secondary | ICD-10-CM | POA: Diagnosis not present

## 2020-08-07 DIAGNOSIS — E1122 Type 2 diabetes mellitus with diabetic chronic kidney disease: Secondary | ICD-10-CM | POA: Diagnosis not present

## 2020-08-07 DIAGNOSIS — K7581 Nonalcoholic steatohepatitis (NASH): Secondary | ICD-10-CM | POA: Diagnosis not present

## 2020-08-07 DIAGNOSIS — I350 Nonrheumatic aortic (valve) stenosis: Secondary | ICD-10-CM | POA: Diagnosis not present

## 2020-08-07 DIAGNOSIS — I130 Hypertensive heart and chronic kidney disease with heart failure and stage 1 through stage 4 chronic kidney disease, or unspecified chronic kidney disease: Secondary | ICD-10-CM | POA: Diagnosis not present

## 2020-08-07 DIAGNOSIS — N179 Acute kidney failure, unspecified: Secondary | ICD-10-CM | POA: Diagnosis not present

## 2020-08-07 DIAGNOSIS — E1165 Type 2 diabetes mellitus with hyperglycemia: Secondary | ICD-10-CM | POA: Diagnosis not present

## 2020-08-14 ENCOUNTER — Other Ambulatory Visit (HOSPITAL_COMMUNITY)
Admission: RE | Admit: 2020-08-14 | Discharge: 2020-08-14 | Disposition: A | Discharge status: Home / Self Care | Payer: Federal, State, Local not specified - PPO | Source: Ambulatory Visit | Attending: Cardiology | Admitting: Cardiology

## 2020-08-14 ENCOUNTER — Telehealth: Payer: Self-pay | Admitting: Student

## 2020-08-14 DIAGNOSIS — G4733 Obstructive sleep apnea (adult) (pediatric): Secondary | ICD-10-CM | POA: Diagnosis not present

## 2020-08-14 DIAGNOSIS — I2510 Atherosclerotic heart disease of native coronary artery without angina pectoris: Secondary | ICD-10-CM | POA: Diagnosis not present

## 2020-08-14 DIAGNOSIS — K7581 Nonalcoholic steatohepatitis (NASH): Secondary | ICD-10-CM | POA: Diagnosis not present

## 2020-08-14 DIAGNOSIS — E785 Hyperlipidemia, unspecified: Secondary | ICD-10-CM | POA: Diagnosis not present

## 2020-08-14 DIAGNOSIS — E876 Hypokalemia: Secondary | ICD-10-CM | POA: Diagnosis not present

## 2020-08-14 DIAGNOSIS — Z79899 Other long term (current) drug therapy: Secondary | ICD-10-CM | POA: Diagnosis not present

## 2020-08-14 DIAGNOSIS — J019 Acute sinusitis, unspecified: Secondary | ICD-10-CM | POA: Diagnosis not present

## 2020-08-14 DIAGNOSIS — D631 Anemia in chronic kidney disease: Secondary | ICD-10-CM | POA: Diagnosis not present

## 2020-08-14 DIAGNOSIS — K7290 Hepatic failure, unspecified without coma: Secondary | ICD-10-CM | POA: Diagnosis not present

## 2020-08-14 DIAGNOSIS — I130 Hypertensive heart and chronic kidney disease with heart failure and stage 1 through stage 4 chronic kidney disease, or unspecified chronic kidney disease: Secondary | ICD-10-CM | POA: Diagnosis not present

## 2020-08-14 DIAGNOSIS — E1165 Type 2 diabetes mellitus with hyperglycemia: Secondary | ICD-10-CM | POA: Diagnosis not present

## 2020-08-14 DIAGNOSIS — M1991 Primary osteoarthritis, unspecified site: Secondary | ICD-10-CM | POA: Diagnosis not present

## 2020-08-14 DIAGNOSIS — I350 Nonrheumatic aortic (valve) stenosis: Secondary | ICD-10-CM | POA: Diagnosis not present

## 2020-08-14 DIAGNOSIS — E1122 Type 2 diabetes mellitus with diabetic chronic kidney disease: Secondary | ICD-10-CM | POA: Diagnosis not present

## 2020-08-14 DIAGNOSIS — N1831 Chronic kidney disease, stage 3a: Secondary | ICD-10-CM | POA: Diagnosis not present

## 2020-08-14 DIAGNOSIS — N179 Acute kidney failure, unspecified: Secondary | ICD-10-CM | POA: Diagnosis not present

## 2020-08-14 DIAGNOSIS — K7460 Unspecified cirrhosis of liver: Secondary | ICD-10-CM | POA: Diagnosis not present

## 2020-08-14 DIAGNOSIS — I5033 Acute on chronic diastolic (congestive) heart failure: Secondary | ICD-10-CM | POA: Diagnosis not present

## 2020-08-14 LAB — BASIC METABOLIC PANEL
Anion gap: 10 (ref 5–15)
BUN: 62 mg/dL — ABNORMAL HIGH (ref 6–20)
CO2: 18 mmol/L — ABNORMAL LOW (ref 22–32)
Calcium: 9.2 mg/dL (ref 8.9–10.3)
Chloride: 104 mmol/L (ref 98–111)
Creatinine, Ser: 2.72 mg/dL — ABNORMAL HIGH (ref 0.61–1.24)
GFR calc Af Amer: 28 mL/min — ABNORMAL LOW (ref >60)
GFR calc non Af Amer: 24 mL/min — ABNORMAL LOW (ref >60)
Glucose, Bld: 345 mg/dL — ABNORMAL HIGH (ref 70–99)
Potassium: 5.8 mmol/L — ABNORMAL HIGH (ref 3.5–5.1)
Sodium: 132 mmol/L — ABNORMAL LOW (ref 135–145)

## 2020-08-14 NOTE — Telephone Encounter (Signed)
I reviewed the chart.  His last post hospital visit was with Ms. Ahmed Prima PA-C.  At that time Demadex was reduced to 40 mg twice daily in light of renal insufficiency with creatinine of 2.1 up from 1.5.  It does not appear that he has had follow-up lab work as yet.  Have him discuss leg redness with PCP in case there is any concern for cellulitis, but I suspect that he may need a temporary increase in his diuretic.  Could consider taking Demadex 60 mg twice daily for a few days and then return to baseline.  He needs a BMET.

## 2020-08-14 NOTE — Telephone Encounter (Signed)
Patient will have bmet done today and agrees to to increase demadex to 60 mg twice a day for 3 days and then return to 40 mg BID. He will speak with pcp regarding leg redness.

## 2020-08-14 NOTE — Telephone Encounter (Signed)
Spoke with Pt who states that he has lower extremity swelling. More so on the Rt leg than the Left leg. Pt had CABG in 2016. Pt reports that the right leg is red but not warm to touch. Onset was 2 day ago. Weight today was 245 lbs, 244 lbs on yesterday and 244 on the day before. Pt has a virtual visit with his PCP on today and will notify them of the swelling. Please advise.

## 2020-08-14 NOTE — Telephone Encounter (Signed)
Per Pt right leg is swollen   PLease call [phone]   Thanks renee

## 2020-08-15 ENCOUNTER — Telehealth: Payer: Self-pay

## 2020-08-15 DIAGNOSIS — Z79899 Other long term (current) drug therapy: Secondary | ICD-10-CM

## 2020-08-15 MED ORDER — SPIRONOLACTONE 100 MG PO TABS: 50.0000 mg | ORAL_TABLET | Freq: Every day | ORAL | 2 refills | Status: DC

## 2020-08-15 NOTE — Telephone Encounter (Signed)
I spoke with patient.He will stop potassium and decrease Aldactone to 50 mg daily.Will repeat bmet in 1 week and also stop by for weight check.

## 2020-08-15 NOTE — Telephone Encounter (Signed)
-----   Message from Satira Sark, MD sent at 08/14/2020  5:16 PM EDT ----- Results reviewed.  Unfortunately, lab work looks worse in terms of potassium and renal function compared to 1 month ago.  Creatinine now 2.72 up from 2.17 and his potassium is 5.8.  Please have him stop potassium supplements completely, reduce Aldactone to 50 mg daily (he also has hepatic cirrhosis with ascites and follows with gastroenterology at [facility]).  We should get him back in for an office visit in the next week or so to recheck weight and response to diuretics.  He would need a repeat BMET at that time.

## 2020-08-20 DIAGNOSIS — D631 Anemia in chronic kidney disease: Secondary | ICD-10-CM | POA: Diagnosis not present

## 2020-08-20 DIAGNOSIS — I350 Nonrheumatic aortic (valve) stenosis: Secondary | ICD-10-CM | POA: Diagnosis not present

## 2020-08-20 DIAGNOSIS — N1831 Chronic kidney disease, stage 3a: Secondary | ICD-10-CM | POA: Diagnosis not present

## 2020-08-20 DIAGNOSIS — E1165 Type 2 diabetes mellitus with hyperglycemia: Secondary | ICD-10-CM | POA: Diagnosis not present

## 2020-08-20 DIAGNOSIS — K7581 Nonalcoholic steatohepatitis (NASH): Secondary | ICD-10-CM | POA: Diagnosis not present

## 2020-08-20 DIAGNOSIS — K7290 Hepatic failure, unspecified without coma: Secondary | ICD-10-CM | POA: Diagnosis not present

## 2020-08-20 DIAGNOSIS — I2510 Atherosclerotic heart disease of native coronary artery without angina pectoris: Secondary | ICD-10-CM | POA: Diagnosis not present

## 2020-08-20 DIAGNOSIS — K7460 Unspecified cirrhosis of liver: Secondary | ICD-10-CM | POA: Diagnosis not present

## 2020-08-20 DIAGNOSIS — M1991 Primary osteoarthritis, unspecified site: Secondary | ICD-10-CM | POA: Diagnosis not present

## 2020-08-20 DIAGNOSIS — E1122 Type 2 diabetes mellitus with diabetic chronic kidney disease: Secondary | ICD-10-CM | POA: Diagnosis not present

## 2020-08-20 DIAGNOSIS — E785 Hyperlipidemia, unspecified: Secondary | ICD-10-CM | POA: Diagnosis not present

## 2020-08-20 DIAGNOSIS — I5033 Acute on chronic diastolic (congestive) heart failure: Secondary | ICD-10-CM | POA: Diagnosis not present

## 2020-08-20 DIAGNOSIS — I130 Hypertensive heart and chronic kidney disease with heart failure and stage 1 through stage 4 chronic kidney disease, or unspecified chronic kidney disease: Secondary | ICD-10-CM | POA: Diagnosis not present

## 2020-08-20 DIAGNOSIS — N179 Acute kidney failure, unspecified: Secondary | ICD-10-CM | POA: Diagnosis not present

## 2020-08-20 DIAGNOSIS — G4733 Obstructive sleep apnea (adult) (pediatric): Secondary | ICD-10-CM | POA: Diagnosis not present

## 2020-08-20 DIAGNOSIS — E876 Hypokalemia: Secondary | ICD-10-CM | POA: Diagnosis not present

## 2020-08-22 ENCOUNTER — Ambulatory Visit (INDEPENDENT_AMBULATORY_CARE_PROVIDER_SITE_OTHER): Payer: Federal, State, Local not specified - PPO

## 2020-08-22 ENCOUNTER — Other Ambulatory Visit: Payer: Self-pay

## 2020-08-22 ENCOUNTER — Other Ambulatory Visit: Payer: Federal, State, Local not specified - PPO

## 2020-08-22 VITALS — HR 74 | Wt 260.0 lb

## 2020-08-22 DIAGNOSIS — Z79899 Other long term (current) drug therapy: Secondary | ICD-10-CM

## 2020-08-22 DIAGNOSIS — Z20822 Contact with and (suspected) exposure to covid-19: Secondary | ICD-10-CM

## 2020-08-22 MED ORDER — TORSEMIDE 20 MG PO TABS
60.0000 mg | ORAL_TABLET | Freq: Two times a day (BID) | ORAL | 3 refills | Status: DC
Start: 2020-08-22 — End: 2020-09-19

## 2020-08-22 NOTE — Progress Notes (Signed)
Patient was to return today for BMET and weight check.He was unable to get to lab in time as his apt with nutrition ran over. He plans to get labs on Monday, 08/25/20   Office weight today is 260 lbs  Home weight is 255 lbs   This is a 10 lbs gain from last home weight on 08/14/20 of 245 Lbs   Per Dr.McDowell : Increase Torsemide to 60 mg BID (from 40 mg BID)   Patient states he goes to [place] next week to discuss liver transplant.States he feels good, redness and swelling in legs has improved greatly in his opinion.     Will call patient after BMET is done.

## 2020-08-22 NOTE — Patient Instructions (Signed)
INCREASE Torsemide to 60 mg twice a day  (2 tablets twice a day)   Get lab work BMET on Monday 9/13   We will call you with results    Thank you for choosing [facility] !

## 2020-08-25 ENCOUNTER — Other Ambulatory Visit: Payer: Self-pay

## 2020-08-25 ENCOUNTER — Encounter: Payer: Self-pay | Admitting: Dietician

## 2020-08-25 ENCOUNTER — Encounter: Payer: Federal, State, Local not specified - PPO | Attending: Family Medicine | Admitting: Dietician

## 2020-08-25 ENCOUNTER — Telehealth: Payer: Self-pay | Admitting: "Endocrinology

## 2020-08-25 ENCOUNTER — Other Ambulatory Visit (HOSPITAL_COMMUNITY)
Admission: RE | Admit: 2020-08-25 | Discharge: 2020-08-25 | Disposition: A | Discharge status: Home / Self Care | Payer: Federal, State, Local not specified - PPO | Source: Ambulatory Visit | Attending: Cardiology | Admitting: Cardiology

## 2020-08-25 VITALS — Ht 70.0 in | Wt 257.6 lb

## 2020-08-25 DIAGNOSIS — K7581 Nonalcoholic steatohepatitis (NASH): Secondary | ICD-10-CM | POA: Diagnosis not present

## 2020-08-25 DIAGNOSIS — E1165 Type 2 diabetes mellitus with hyperglycemia: Secondary | ICD-10-CM | POA: Insufficient documentation

## 2020-08-25 DIAGNOSIS — Z79899 Other long term (current) drug therapy: Secondary | ICD-10-CM | POA: Diagnosis not present

## 2020-08-25 DIAGNOSIS — K7460 Unspecified cirrhosis of liver: Secondary | ICD-10-CM

## 2020-08-25 DIAGNOSIS — I2510 Atherosclerotic heart disease of native coronary artery without angina pectoris: Secondary | ICD-10-CM | POA: Diagnosis not present

## 2020-08-25 LAB — BASIC METABOLIC PANEL
Anion gap: 11 (ref 5–15)
BUN: 85 mg/dL — ABNORMAL HIGH (ref 6–20)
CO2: 22 mmol/L (ref 22–32)
Calcium: 9.3 mg/dL (ref 8.9–10.3)
Chloride: 99 mmol/L (ref 98–111)
Creatinine, Ser: 2.78 mg/dL — ABNORMAL HIGH (ref 0.61–1.24)
GFR calc Af Amer: 27 mL/min — ABNORMAL LOW (ref >60)
GFR calc non Af Amer: 24 mL/min — ABNORMAL LOW (ref >60)
Glucose, Bld: 234 mg/dL — ABNORMAL HIGH (ref 70–99)
Potassium: 5.2 mmol/L — ABNORMAL HIGH (ref 3.5–5.1)
Sodium: 132 mmol/L — ABNORMAL LOW (ref 135–145)

## 2020-08-25 LAB — NOVEL CORONAVIRUS, NAA: SARS-CoV-2, NAA: NOT DETECTED

## 2020-08-25 NOTE — Telephone Encounter (Signed)
Error when checking in for dietician

## 2020-08-25 NOTE — Patient Instructions (Addendum)
Maintain following the plate method after your transplant.  Work towards getting your 100g of protein a day.  Try Glucerna protein shake in place of your Ensure Plus.  Keep up the great work eating your vegetables!  Keep up walking as many days as you can!

## 2020-08-25 NOTE — Progress Notes (Signed)
Medical Nutrition Therapy:  Appt start time: 0900 end time:  0930.  Assessment:  Primary concerns today: NASH, Cirrohosis, Obesity, DM, CHF. Diabetes Type 2.,  Pt will be going to [facility] tomorrow to begin the process of getting his liver transplant. He found a donor. He will be finding out what he needs to do to prepare for the procedure. Will also be having a knee replacement in the future. Pt reports loving and eating vegetables all the time. Struggles to get his protein needs of 100g daily. FBS of 57 yesterday. Drank orange juice, BS hit 69 in 15 minutes. Walks about 1/2 to 1 mile almost every day. His knee usually allows him to do that.  CMP Latest Ref Rng & Units 08/14/2020 07/10/2020 07/01/2020  Glucose 70 - 99 mg/dL 345(H) 276(H) 101(H)  BUN 6 - 20 mg/dL 62(H) 49(H) 38(H)  Creatinine 0.61 - 1.24 mg/dL 2.72(H) 2.17(H) 1.56(H)  Sodium 135 - 145 mmol/L 132(L) 134(L) 138  Potassium 3.5 - 5.1 mmol/L 5.8(H) 4.7 3.4(L)  Chloride 98 - 111 mmol/L 104 99 95(L)  CO2 22 - 32 mmol/L 18(L) 22 29  Calcium 8.9 - 10.3 mg/dL 9.2 10.2 9.5  Total Protein 6.5 - 8.1 g/dL - 8.9(H) -  Total Bilirubin 0.3 - 1.2 mg/dL - 1.8(H) -  Alkaline Phos 38 - 126 U/L - 63 -  AST 15 - 41 U/L - 29 -  ALT 0 - 44 U/L - 15 -   Lipid Panel     Component Value Date/Time   CHOL 120 05/25/2019 1001   TRIG 136 05/25/2019 1001   HDL 38 (L) 05/25/2019 1001   CHOLHDL 3.2 05/25/2019 1001   VLDL 27 05/25/2019 1001   LDLCALC 55 05/25/2019 1001   Lab Results  Component Value Date   HGBA1C 6.8 (H) 06/20/2020    Preferred Learning Style:   No preference indicated   Learning Readiness:   Ready  Change in progress   MEDICATIONS:    DIETARY INTAKE:  24-hr recall:  B ( AM): 3 Eggs, 1 [place] sausage, cantaloupe, Snk ( AM):  none L ( PM): Chicken biscuit, water Snk ( PM): Apple D ( PM):  2 chicken tacos, chips, guacamole, water Snk ( PM):  Beverages: water  Usual physical activity: Walks 1/2 mile 3 times per  week.  Estimated energy needs: 1600 -1800  calories 200  g carbohydrates 100 g protein 50 g fat  Progress Towards Goal(s):  In progress.   Nutritional Diagnosis:  NB-1.1 Food and nutrition-related knowledge deficit As related to NASH, DM and Excessive calories.  As evidenced by Cirrhosis, A1C 6.8 and BMI 34 after losing 60+ lbs of fluid..    Intervention:  Nutrition and Diabetes education provided on My Plate, CHO counting, meal planning, portion sizes, timing of meals, avoiding snacks between meals unless having a low blood sugar, target ranges for A1C and blood sugars, signs/symptoms and treatment of hyper/hypoglycemia, monitoring blood sugars, taking medications as prescribed, benefits of exercising 30 minutes per day and prevention of complications of DM.    Goals:  Maintain following the plate method after your transplant. Work towards getting your 100g of protein a day. Try Glucerna protein shake in place of your Ensure Plus. Keep up the great work eating your vegetables! Keep up walking as many days as you can!   Teaching Method Utilized:  Visual Auditory Hands on  Handouts given during visit include:  The Plate Method   Meal Plan Card  S/S of hyper/hypoglycemia  Low salt heart healty diet.  Barriers to learning/adherence to lifestyle change: CHF and deconditioning  Demonstrated degree of understanding via:  Teach Back   Monitoring/Evaluation:  Dietary intake, exercise, and body weight in 1 month(s). Would recommend to reduce long acting insulin by 10% having had episodes of hypoglycemia. Would recommend to combine the amount of Tresiba to be given once  A day instead of 2 shots per day with the 10% decrease in TDD or more if needed.[name]

## 2020-08-26 ENCOUNTER — Telehealth: Payer: Self-pay

## 2020-08-26 DIAGNOSIS — K7460 Unspecified cirrhosis of liver: Secondary | ICD-10-CM | POA: Diagnosis not present

## 2020-08-26 DIAGNOSIS — E1165 Type 2 diabetes mellitus with hyperglycemia: Secondary | ICD-10-CM | POA: Diagnosis not present

## 2020-08-26 DIAGNOSIS — E785 Hyperlipidemia, unspecified: Secondary | ICD-10-CM | POA: Diagnosis not present

## 2020-08-26 DIAGNOSIS — E669 Obesity, unspecified: Secondary | ICD-10-CM | POA: Diagnosis not present

## 2020-08-26 DIAGNOSIS — Z6836 Body mass index (BMI) 36.0-36.9, adult: Secondary | ICD-10-CM | POA: Diagnosis not present

## 2020-08-26 DIAGNOSIS — Z01810 Encounter for preprocedural cardiovascular examination: Secondary | ICD-10-CM | POA: Diagnosis not present

## 2020-08-26 DIAGNOSIS — I2510 Atherosclerotic heart disease of native coronary artery without angina pectoris: Secondary | ICD-10-CM | POA: Diagnosis not present

## 2020-08-26 DIAGNOSIS — Z01818 Encounter for other preprocedural examination: Secondary | ICD-10-CM | POA: Insufficient documentation

## 2020-08-26 DIAGNOSIS — I1310 Hypertensive heart and chronic kidney disease without heart failure, with stage 1 through stage 4 chronic kidney disease, or unspecified chronic kidney disease: Secondary | ICD-10-CM | POA: Diagnosis not present

## 2020-08-26 DIAGNOSIS — K8020 Calculus of gallbladder without cholecystitis without obstruction: Secondary | ICD-10-CM | POA: Diagnosis not present

## 2020-08-26 DIAGNOSIS — D696 Thrombocytopenia, unspecified: Secondary | ICD-10-CM | POA: Diagnosis not present

## 2020-08-26 DIAGNOSIS — K766 Portal hypertension: Secondary | ICD-10-CM | POA: Diagnosis not present

## 2020-08-26 DIAGNOSIS — D509 Iron deficiency anemia, unspecified: Secondary | ICD-10-CM | POA: Diagnosis not present

## 2020-08-26 DIAGNOSIS — K7581 Nonalcoholic steatohepatitis (NASH): Secondary | ICD-10-CM | POA: Diagnosis not present

## 2020-08-26 DIAGNOSIS — E1122 Type 2 diabetes mellitus with diabetic chronic kidney disease: Secondary | ICD-10-CM | POA: Diagnosis not present

## 2020-08-26 DIAGNOSIS — G4733 Obstructive sleep apnea (adult) (pediatric): Secondary | ICD-10-CM | POA: Diagnosis not present

## 2020-08-26 DIAGNOSIS — K2950 Unspecified chronic gastritis without bleeding: Secondary | ICD-10-CM | POA: Diagnosis not present

## 2020-08-26 DIAGNOSIS — Z951 Presence of aortocoronary bypass graft: Secondary | ICD-10-CM | POA: Diagnosis not present

## 2020-08-26 DIAGNOSIS — I8500 Esophageal varices without bleeding: Secondary | ICD-10-CM | POA: Diagnosis not present

## 2020-08-26 DIAGNOSIS — R161 Splenomegaly, not elsewhere classified: Secondary | ICD-10-CM | POA: Diagnosis not present

## 2020-08-26 DIAGNOSIS — K7290 Hepatic failure, unspecified without coma: Secondary | ICD-10-CM | POA: Diagnosis not present

## 2020-08-26 DIAGNOSIS — N183 Chronic kidney disease, stage 3 unspecified: Secondary | ICD-10-CM

## 2020-08-26 DIAGNOSIS — I10 Essential (primary) hypertension: Secondary | ICD-10-CM | POA: Diagnosis not present

## 2020-08-26 DIAGNOSIS — Z23 Encounter for immunization: Secondary | ICD-10-CM | POA: Diagnosis not present

## 2020-08-26 DIAGNOSIS — I517 Cardiomegaly: Secondary | ICD-10-CM | POA: Diagnosis not present

## 2020-08-26 DIAGNOSIS — Z79899 Other long term (current) drug therapy: Secondary | ICD-10-CM | POA: Diagnosis not present

## 2020-08-26 DIAGNOSIS — R188 Other ascites: Secondary | ICD-10-CM | POA: Diagnosis not present

## 2020-08-26 DIAGNOSIS — Z7982 Long term (current) use of aspirin: Secondary | ICD-10-CM | POA: Diagnosis not present

## 2020-08-26 DIAGNOSIS — N189 Chronic kidney disease, unspecified: Secondary | ICD-10-CM | POA: Diagnosis not present

## 2020-08-26 NOTE — Telephone Encounter (Signed)
-----   Message from Satira Sark, MD sent at 08/26/2020  3:16 PM EDT ----- Results reviewed.  Creatinine 2.78, previously 2.72, potassium has come down to 5.2.  Aldactone was already cut back, he is not on any potassium supplements.  Continue current dose of Demadex which was recently increased to 60 mg twice daily.  Would also recommend nephrology consultation.

## 2020-08-26 NOTE — Telephone Encounter (Signed)
I have left message for patient to call back.I have placed referral to nephrologist, Dr.Manpreet Bhutani.

## 2020-08-27 DIAGNOSIS — Z9189 Other specified personal risk factors, not elsewhere classified: Secondary | ICD-10-CM | POA: Diagnosis not present

## 2020-08-27 DIAGNOSIS — K7581 Nonalcoholic steatohepatitis (NASH): Secondary | ICD-10-CM | POA: Diagnosis not present

## 2020-08-27 DIAGNOSIS — Z9049 Acquired absence of other specified parts of digestive tract: Secondary | ICD-10-CM | POA: Diagnosis not present

## 2020-08-27 DIAGNOSIS — Z7189 Other specified counseling: Secondary | ICD-10-CM | POA: Diagnosis not present

## 2020-08-27 DIAGNOSIS — Z794 Long term (current) use of insulin: Secondary | ICD-10-CM | POA: Diagnosis not present

## 2020-08-27 DIAGNOSIS — R197 Diarrhea, unspecified: Secondary | ICD-10-CM | POA: Diagnosis not present

## 2020-08-27 DIAGNOSIS — Z01818 Encounter for other preprocedural examination: Secondary | ICD-10-CM | POA: Diagnosis not present

## 2020-08-27 DIAGNOSIS — Z515 Encounter for palliative care: Secondary | ICD-10-CM | POA: Diagnosis not present

## 2020-08-27 DIAGNOSIS — E1165 Type 2 diabetes mellitus with hyperglycemia: Secondary | ICD-10-CM | POA: Diagnosis not present

## 2020-08-27 DIAGNOSIS — F4329 Adjustment disorder with other symptoms: Secondary | ICD-10-CM | POA: Diagnosis not present

## 2020-08-27 DIAGNOSIS — Z01811 Encounter for preprocedural respiratory examination: Secondary | ICD-10-CM | POA: Diagnosis not present

## 2020-08-27 DIAGNOSIS — I8500 Esophageal varices without bleeding: Secondary | ICD-10-CM | POA: Diagnosis not present

## 2020-08-27 DIAGNOSIS — Z96652 Presence of left artificial knee joint: Secondary | ICD-10-CM | POA: Diagnosis not present

## 2020-08-27 DIAGNOSIS — K7460 Unspecified cirrhosis of liver: Secondary | ICD-10-CM | POA: Diagnosis not present

## 2020-08-27 DIAGNOSIS — R5381 Other malaise: Secondary | ICD-10-CM | POA: Diagnosis not present

## 2020-08-27 DIAGNOSIS — Z01810 Encounter for preprocedural cardiovascular examination: Secondary | ICD-10-CM | POA: Diagnosis not present

## 2020-08-27 DIAGNOSIS — R601 Generalized edema: Secondary | ICD-10-CM | POA: Diagnosis not present

## 2020-08-27 DIAGNOSIS — K7290 Hepatic failure, unspecified without coma: Secondary | ICD-10-CM | POA: Diagnosis not present

## 2020-08-27 DIAGNOSIS — Z951 Presence of aortocoronary bypass graft: Secondary | ICD-10-CM | POA: Diagnosis not present

## 2020-08-27 DIAGNOSIS — I8510 Secondary esophageal varices without bleeding: Secondary | ICD-10-CM | POA: Diagnosis not present

## 2020-08-27 DIAGNOSIS — I2510 Atherosclerotic heart disease of native coronary artery without angina pectoris: Secondary | ICD-10-CM | POA: Diagnosis not present

## 2020-08-27 DIAGNOSIS — Z713 Dietary counseling and surveillance: Secondary | ICD-10-CM | POA: Diagnosis not present

## 2020-08-27 DIAGNOSIS — K766 Portal hypertension: Secondary | ICD-10-CM | POA: Diagnosis not present

## 2020-08-28 NOTE — Telephone Encounter (Signed)
Mr. Rountree spoke with L.Pinnix, LPN and message was relayed.He has apt with Dr.Bhutani on 09/09/20

## 2020-09-01 DIAGNOSIS — E1165 Type 2 diabetes mellitus with hyperglycemia: Secondary | ICD-10-CM | POA: Diagnosis not present

## 2020-09-01 DIAGNOSIS — I130 Hypertensive heart and chronic kidney disease with heart failure and stage 1 through stage 4 chronic kidney disease, or unspecified chronic kidney disease: Secondary | ICD-10-CM | POA: Diagnosis not present

## 2020-09-01 DIAGNOSIS — K7460 Unspecified cirrhosis of liver: Secondary | ICD-10-CM | POA: Diagnosis not present

## 2020-09-01 DIAGNOSIS — K7581 Nonalcoholic steatohepatitis (NASH): Secondary | ICD-10-CM | POA: Diagnosis not present

## 2020-09-01 DIAGNOSIS — N1831 Chronic kidney disease, stage 3a: Secondary | ICD-10-CM | POA: Diagnosis not present

## 2020-09-01 DIAGNOSIS — E785 Hyperlipidemia, unspecified: Secondary | ICD-10-CM | POA: Diagnosis not present

## 2020-09-01 DIAGNOSIS — I5033 Acute on chronic diastolic (congestive) heart failure: Secondary | ICD-10-CM | POA: Diagnosis not present

## 2020-09-01 DIAGNOSIS — E1122 Type 2 diabetes mellitus with diabetic chronic kidney disease: Secondary | ICD-10-CM | POA: Diagnosis not present

## 2020-09-01 DIAGNOSIS — G4733 Obstructive sleep apnea (adult) (pediatric): Secondary | ICD-10-CM | POA: Diagnosis not present

## 2020-09-01 DIAGNOSIS — I129 Hypertensive chronic kidney disease with stage 1 through stage 4 chronic kidney disease, or unspecified chronic kidney disease: Secondary | ICD-10-CM | POA: Diagnosis not present

## 2020-09-01 DIAGNOSIS — Z23 Encounter for immunization: Secondary | ICD-10-CM | POA: Diagnosis not present

## 2020-09-01 DIAGNOSIS — N189 Chronic kidney disease, unspecified: Secondary | ICD-10-CM | POA: Diagnosis not present

## 2020-09-01 DIAGNOSIS — Z794 Long term (current) use of insulin: Secondary | ICD-10-CM | POA: Diagnosis not present

## 2020-09-02 DIAGNOSIS — G4733 Obstructive sleep apnea (adult) (pediatric): Secondary | ICD-10-CM | POA: Diagnosis not present

## 2020-09-02 DIAGNOSIS — I130 Hypertensive heart and chronic kidney disease with heart failure and stage 1 through stage 4 chronic kidney disease, or unspecified chronic kidney disease: Secondary | ICD-10-CM | POA: Diagnosis not present

## 2020-09-02 DIAGNOSIS — I350 Nonrheumatic aortic (valve) stenosis: Secondary | ICD-10-CM | POA: Diagnosis not present

## 2020-09-02 DIAGNOSIS — N179 Acute kidney failure, unspecified: Secondary | ICD-10-CM | POA: Diagnosis not present

## 2020-09-02 DIAGNOSIS — K7581 Nonalcoholic steatohepatitis (NASH): Secondary | ICD-10-CM | POA: Diagnosis not present

## 2020-09-02 DIAGNOSIS — E785 Hyperlipidemia, unspecified: Secondary | ICD-10-CM | POA: Diagnosis not present

## 2020-09-02 DIAGNOSIS — E1122 Type 2 diabetes mellitus with diabetic chronic kidney disease: Secondary | ICD-10-CM | POA: Diagnosis not present

## 2020-09-02 DIAGNOSIS — M1991 Primary osteoarthritis, unspecified site: Secondary | ICD-10-CM | POA: Diagnosis not present

## 2020-09-02 DIAGNOSIS — I5033 Acute on chronic diastolic (congestive) heart failure: Secondary | ICD-10-CM | POA: Diagnosis not present

## 2020-09-02 DIAGNOSIS — E1165 Type 2 diabetes mellitus with hyperglycemia: Secondary | ICD-10-CM | POA: Diagnosis not present

## 2020-09-02 DIAGNOSIS — N1831 Chronic kidney disease, stage 3a: Secondary | ICD-10-CM | POA: Diagnosis not present

## 2020-09-02 DIAGNOSIS — I2510 Atherosclerotic heart disease of native coronary artery without angina pectoris: Secondary | ICD-10-CM | POA: Diagnosis not present

## 2020-09-02 DIAGNOSIS — D631 Anemia in chronic kidney disease: Secondary | ICD-10-CM | POA: Diagnosis not present

## 2020-09-02 DIAGNOSIS — K7460 Unspecified cirrhosis of liver: Secondary | ICD-10-CM | POA: Diagnosis not present

## 2020-09-02 DIAGNOSIS — K7290 Hepatic failure, unspecified without coma: Secondary | ICD-10-CM | POA: Diagnosis not present

## 2020-09-02 DIAGNOSIS — E876 Hypokalemia: Secondary | ICD-10-CM | POA: Diagnosis not present

## 2020-09-11 ENCOUNTER — Ambulatory Visit: Payer: Federal, State, Local not specified - PPO

## 2020-09-12 DIAGNOSIS — E1122 Type 2 diabetes mellitus with diabetic chronic kidney disease: Secondary | ICD-10-CM | POA: Diagnosis not present

## 2020-09-12 DIAGNOSIS — K7460 Unspecified cirrhosis of liver: Secondary | ICD-10-CM | POA: Diagnosis not present

## 2020-09-12 DIAGNOSIS — N189 Chronic kidney disease, unspecified: Secondary | ICD-10-CM | POA: Diagnosis not present

## 2020-09-12 DIAGNOSIS — N170 Acute kidney failure with tubular necrosis: Secondary | ICD-10-CM | POA: Diagnosis not present

## 2020-09-12 DIAGNOSIS — D696 Thrombocytopenia, unspecified: Secondary | ICD-10-CM | POA: Diagnosis not present

## 2020-09-12 DIAGNOSIS — I517 Cardiomegaly: Secondary | ICD-10-CM | POA: Diagnosis not present

## 2020-09-12 DIAGNOSIS — E875 Hyperkalemia: Secondary | ICD-10-CM | POA: Diagnosis not present

## 2020-09-12 DIAGNOSIS — E871 Hypo-osmolality and hyponatremia: Secondary | ICD-10-CM | POA: Diagnosis not present

## 2020-09-15 ENCOUNTER — Other Ambulatory Visit: Payer: Self-pay | Admitting: Nephrology

## 2020-09-15 ENCOUNTER — Other Ambulatory Visit (HOSPITAL_COMMUNITY): Payer: Self-pay | Admitting: Nephrology

## 2020-09-15 DIAGNOSIS — D696 Thrombocytopenia, unspecified: Secondary | ICD-10-CM

## 2020-09-15 DIAGNOSIS — E871 Hypo-osmolality and hyponatremia: Secondary | ICD-10-CM

## 2020-09-15 DIAGNOSIS — N170 Acute kidney failure with tubular necrosis: Secondary | ICD-10-CM

## 2020-09-15 DIAGNOSIS — E875 Hyperkalemia: Secondary | ICD-10-CM

## 2020-09-15 DIAGNOSIS — I517 Cardiomegaly: Secondary | ICD-10-CM

## 2020-09-15 DIAGNOSIS — K7460 Unspecified cirrhosis of liver: Secondary | ICD-10-CM

## 2020-09-15 DIAGNOSIS — E1122 Type 2 diabetes mellitus with diabetic chronic kidney disease: Secondary | ICD-10-CM

## 2020-09-18 ENCOUNTER — Other Ambulatory Visit: Payer: Self-pay | Admitting: Nurse Practitioner

## 2020-09-18 DIAGNOSIS — E871 Hypo-osmolality and hyponatremia: Secondary | ICD-10-CM | POA: Diagnosis not present

## 2020-09-18 DIAGNOSIS — D696 Thrombocytopenia, unspecified: Secondary | ICD-10-CM | POA: Diagnosis not present

## 2020-09-18 DIAGNOSIS — K7460 Unspecified cirrhosis of liver: Secondary | ICD-10-CM | POA: Diagnosis not present

## 2020-09-18 DIAGNOSIS — E1122 Type 2 diabetes mellitus with diabetic chronic kidney disease: Secondary | ICD-10-CM | POA: Diagnosis not present

## 2020-09-18 DIAGNOSIS — N189 Chronic kidney disease, unspecified: Secondary | ICD-10-CM | POA: Diagnosis not present

## 2020-09-18 DIAGNOSIS — I8500 Esophageal varices without bleeding: Secondary | ICD-10-CM

## 2020-09-18 DIAGNOSIS — N170 Acute kidney failure with tubular necrosis: Secondary | ICD-10-CM | POA: Diagnosis not present

## 2020-09-18 DIAGNOSIS — G4733 Obstructive sleep apnea (adult) (pediatric): Secondary | ICD-10-CM | POA: Diagnosis not present

## 2020-09-19 ENCOUNTER — Ambulatory Visit (INDEPENDENT_AMBULATORY_CARE_PROVIDER_SITE_OTHER): Payer: Federal, State, Local not specified - PPO | Admitting: Cardiology

## 2020-09-19 ENCOUNTER — Other Ambulatory Visit: Payer: Self-pay

## 2020-09-19 ENCOUNTER — Encounter: Payer: Self-pay | Admitting: Cardiology

## 2020-09-19 VITALS — BP 112/68 | HR 68 | Ht 70.0 in | Wt 246.0 lb

## 2020-09-19 DIAGNOSIS — K7460 Unspecified cirrhosis of liver: Secondary | ICD-10-CM

## 2020-09-19 DIAGNOSIS — I25119 Atherosclerotic heart disease of native coronary artery with unspecified angina pectoris: Secondary | ICD-10-CM

## 2020-09-19 DIAGNOSIS — N1832 Chronic kidney disease, stage 3b: Secondary | ICD-10-CM

## 2020-09-19 DIAGNOSIS — I6523 Occlusion and stenosis of bilateral carotid arteries: Secondary | ICD-10-CM

## 2020-09-19 DIAGNOSIS — R188 Other ascites: Secondary | ICD-10-CM

## 2020-09-19 DIAGNOSIS — I5032 Chronic diastolic (congestive) heart failure: Secondary | ICD-10-CM

## 2020-09-19 NOTE — Patient Instructions (Signed)
Medication Instructions:  Your physician recommends that you continue on your current medications as directed. Please refer to the Current Medication list given to you today.  *If you need a refill on your cardiac medications before your next appointment, please call your pharmacy*   Lab Work: None today If you have labs (blood work) drawn today and your tests are completely normal, you will receive your results only by: . MyChart Message (if you have MyChart) OR . A paper copy in the mail If you have any lab test that is abnormal or we need to change your treatment, we will call you to review the results.   Testing/Procedures: None today   Follow-Up: At [facility], you and your health needs are our priority.  As part of our continuing mission to provide you with exceptional heart care, we have created designated Provider Care Teams.  These Care Teams include your primary Cardiologist (physician) and Advanced Practice Providers (APPs -  Physician Assistants and Nurse Practitioners) who all work together to provide you with the care you need, when you need it.  We recommend signing up for the patient portal called "MyChart".  Sign up information is provided on this After Visit Summary.  MyChart is used to connect with patients for Virtual Visits (Telemedicine).  Patients are able to view lab/test results, encounter notes, upcoming appointments, etc.  Non-urgent messages can be sent to your provider as well.   To learn more about what you can do with MyChart, go to [email or web address].    Your next appointment:   3 month(s)  The format for your next appointment:   In Person  Provider:   Samuel McDowell, MD   Other Instructions None      Thank you for choosing [facility] !         

## 2020-09-19 NOTE — Progress Notes (Signed)
Cardiology Office Note  Date: 09/19/2020   ID: [name], [name] 1961-11-23, MRN [id number]  PCP:  [name], MD  Cardiologist:  Rozann Lesches, MD Electrophysiologist:  None   Chief Complaint  Patient presents with  . Cardiac follow-up    History of Present Illness: [name] is a 59 y.o. male last seen in July by Ms. Strader PA-C.  Interval chart reviewed including follow-up by nephrology.  He had follow-up with Dr. Theador Hawthorne in early October.  At that time Demadex was changed to 60 mg twice daily on Monday/Wednesday/Friday and once daily for the rest of the week.  He has had acute on chronic renal failure with CKD stage IIIb at baseline.  Recent lab work is noted below.  He tells me that he has been taking Demadex 60 mg daily, thought that that is what they had decided on.  His weight has not increased any further, in fact overall trend is down compared to earlier in September.  The remainder of his medications are outlined below.  He has cirrhosis with portal hypertension and ascites, now following at the [place].  He states that he is currently on the transplant list.  Past Medical History:  Diagnosis Date  . Anemia   . Arthritis   . CAD (coronary artery disease)    Multivessel disease status post CABG 08/2015  . CKD (chronic kidney disease) stage 3, GFR 30-59 ml/min (HCC)   . Diastolic CHF ([place])   . Essential hypertension   . Hyperlipidemia   . OSA on CPAP   . Polyclonal gammopathy   . Thrombocytopenia ([place]) 2016  . Type 2 diabetes mellitus ([place])     Past Surgical History:  Procedure Laterality Date  . APPENDECTOMY    . Biceps tendon surgery Right   . BIOPSY  11/22/2019   Procedure: BIOPSY;  Surgeon: Daneil Dolin, MD;  [facility];  Service: Endoscopy;;  . CARDIAC CATHETERIZATION N/A 08/25/2015   Procedure: Left Heart Cath and Coronary Angiography;  Surgeon: Belva Crome, MD;  Location: [facility];   Service: Cardiovascular;  Laterality: N/A;  . COLONOSCOPY WITH PROPOFOL N/A 11/22/2019   Procedure: COLONOSCOPY WITH PROPOFOL;  Surgeon: Daneil Dolin, MD; Four 4-5 mm polyps, findings suggestive of portal colopathy, congested appearing colonic mucosa diffusely, rectal varices, and adequate right colon prep.  Pathology with tubular adenomas and hyperplastic polyp.  Right colon biopsy with focal active colitis.  Recommendations to repeat colonoscopy in 3 months due to poor prep.  . COLONOSCOPY WITH PROPOFOL N/A 03/17/2020   Procedure: COLONOSCOPY WITH PROPOFOL;  Surgeon: Daneil Dolin, MD;  Location[facility];  Service: Endoscopy;  Laterality: N/A;  8:45am - pt does not need covid test, was + 2/4 <90 days  . CORONARY ARTERY BYPASS GRAFT N/A 08/29/2015   Procedure: CORONARY ARTERY BYPASS GRAFTING (CABG);  Surgeon: [name], MD;  Location: [facility];  Service: Open Heart Surgery;  Laterality: N/A;  . ESOPHAGOGASTRODUODENOSCOPY (EGD) WITH PROPOFOL N/A 11/22/2019   Procedure: ESOPHAGOGASTRODUODENOSCOPY (EGD) WITH PROPOFOL;  Surgeon: Daneil Dolin, MD; 4 columns of grade 2-3 esophageal varices, portal gastropathy, gastric polyp/abnormal gastric mucosa s/p biopsy.  Pathology with hyperplastic polyp, mild chronic gastritis, negative H. pylori.  [name] KNEE ARTHROSCOPY Left   . TEE WITHOUT CARDIOVERSION N/A 08/29/2015   Procedure: TRANSESOPHAGEAL ECHOCARDIOGRAM (TEE);  Surgeon: [name], MD;  Location: [facility];  Service: Open Heart Surgery;  Laterality: N/A;  .  TOTAL KNEE ARTHROPLASTY Left 03/09/2017   Procedure: TOTAL KNEE ARTHROPLASTY;  Surgeon: Carole Civil, MD;  Location: [facility];  Service: Orthopedics;  Laterality: Left;    Current Outpatient Medications  Medication Sig Dispense Refill  . albuterol (VENTOLIN HFA) 108 (90 Base) MCG/ACT inhaler Inhale 2 puffs into the lungs every 6 (six) hours as needed for wheezing or shortness of breath. 18 g 0  . aspirin EC 81 MG tablet Take  81 mg by mouth at bedtime.     . Cholecalciferol (DIALYVITE VITAMIN D 5000) 125 MCG (5000 UT) capsule Take 5,000 Units by mouth in the morning.     [name] Bandages & Supports (MEDICAL COMPRESSION STOCKINGS) MISC 1 each by Does not apply route as directed. Low pressure knee high compression stockings Dx: leg swelling 1 each 0  . ELDERBERRY PO Take by mouth at bedtime. With  Vitamin c and zinc     . ferrous sulfate 325 (65 FE) MG tablet Take 325 mg by mouth in the morning.     . Flaxseed, Linseed, (FLAXSEED OIL PO) Take 1,400 mg by mouth at bedtime.     . insulin aspart (NOVOLOG) 100 UNIT/ML injection Inject 5-30 Units into the skin 3 (three) times daily before meals.     . lactulose (CHRONULAC) 10 GM/15ML solution Take 30 mLs (20 g total) by mouth daily. 236 mL 0  . loratadine (CLARITIN) 10 MG tablet Take 10 mg by mouth in the morning.     . Magnesium 250 MG TABS Take 250 mg by mouth daily.    . nadolol (CORGARD) 40 MG tablet Take 1 tablet (40 mg total) by mouth daily. 30 tablet 3  . omeprazole (PRILOSEC) 40 MG capsule Take 40 mg by mouth at bedtime.  2  . pravastatin (PRAVACHOL) 20 MG tablet TAKE 1 TABLET(20 MG) BY MOUTH AT BEDTIME 90 tablet 3  . spironolactone (ALDACTONE) 100 MG tablet Take 0.5 tablets (50 mg total) by mouth daily. 30 tablet 2  . torsemide (DEMADEX) 20 MG tablet Take 60 mg by mouth daily. Take 3 tablets (60 mg ) daily    . traMADol-acetaminophen (ULTRACET) 37.5-325 MG tablet Take 1 tablet by mouth every 4 (four) hours as needed for severe pain.    . TRESIBA FLEXTOUCH 200 UNIT/ML SOPN Inject 40-70 Units into the skin See admin instructions. Inject 70 units into the skin in the morning and 40 units at bedtime    . TURMERIC CURCUMIN PO Take 2,000 mg by mouth at bedtime.     . Armodafinil 200 MG TABS Take 200 mg by mouth daily as needed (for sleepiness). (Patient not taking: Reported on 09/19/2020)     No current facility-administered medications for this visit.   Allergies:   Patient has no known allergies.   ROS: Leg swelling much improved, uses compression stockings.  Physical Exam: VS:  BP 112/68   Pulse 68   Ht 5\' 10"  (1.778 m)   Wt 246 lb (111.6 kg)   SpO2 98%   BMI 35.30 kg/m , BMI Body mass index is 35.3 kg/m.  Wt Readings from Last 3 Encounters:  09/19/20 246 lb (111.6 kg)  08/25/20 257 lb 9.6 oz (116.8 kg)  08/22/20 260 lb (117.9 kg)    General: Patient appears comfortable at rest. HEENT: Conjunctiva and lids normal, wearing a mask. Neck: Supple, no elevated JVP or carotid bruits, no thyromegaly. Lungs: Decreased breath sounds at the bases, no crackles. Cardiac: Regular rate and rhythm, no S3 or  significant systolic murmur. Abdomen: Protuberant, nontender, bowel sounds present. Extremities: Compression stockings in place without pitting edema.  ECG:  An ECG dated 06/20/2020 was personally reviewed today and demonstrated:  Sinus rhythm with low voltage and nonspecific T wave changes.  Recent Labwork: 06/20/2020: B Natriuretic Peptide 601.0 06/30/2020: Magnesium 1.9 07/10/2020: ALT 15; AST 29; Hemoglobin 12.0; Platelets 92 08/25/2020: BUN 85; Creatinine, Ser 2.78; Potassium 5.2; Sodium 132     Component Value Date/Time   CHOL 120 05/25/2019 1001   TRIG 136 05/25/2019 1001   HDL 38 (L) 05/25/2019 1001   CHOLHDL 3.2 05/25/2019 1001   VLDL 27 05/25/2019 1001   LDLCALC 55 05/25/2019 1001    Other Studies Reviewed Today:  Echocardiogram 05/26/2020: 1. Left ventricular ejection fraction, by estimation, is 60 to 65%. The  left ventricle has normal function. The left ventricle has no regional  wall motion abnormalities. The left ventricular internal cavity size was  mildly dilated. There is mild left  ventricular hypertrophy. Left ventricular diastolic parameters are  indeterminate.  2. Right ventricular systolic function is normal. The right ventricular  size is normal. There is normal pulmonary artery systolic pressure.  3. The mitral  valve is abnormal. Mild mitral valve regurgitation.  4. AV is thickened, calcifieid Difficult to see well. Peak and mean  gradients through the valve are 31 and 18 mm Hg respectively consistent  with mild AS. Compared to echo report from 2019, gradients are increased.  . The aortic valve is abnormal. Aortic  valve regurgitation is not visualized. Mild aortic valve stenosis.  5. The inferior vena cava is dilated in size with <50% respiratory  variability, suggesting right atrial pressure of 15 mmHg.   Carotid Dopplers 05/14/2020: Summary:  Right Carotid: Velocities in the right ICA are consistent with a 1-39%  stenosis.   Left Carotid: Velocities in the left ICA are consistent with a 40-59%  stenosis.   Vertebrals: Bilateral vertebral arteries demonstrate antegrade flow.  Subclavians: Normal flow hemodynamics were seen in bilateral subclavian        arteries.   Assessment and Plan:  1.  History of fluid overload in the setting of chronic diastolic heart failure, cirrhosis with portal hypertension and ascites, and CKD stage IIIb.  Weight trend is down.  He has been on Demadex 60 mg once daily since last Friday, no weight increase and we will continue for now.  No change in Aldactone or nadolol.  2.  CKD stage IIIb with recent acute insufficiency.  Creatinine 2.78.  Demadex has been reduced and he has follow-up with Dr. Theador Hawthorne later this month.  3.  Multivessel CAD status post CABG in 2016.  LVEF 60 to 65%.  He does not report any active angina at this time and otherwise continues on aspirin, beta-blocker in the form of nadolol, and Pravachol.  Medication Adjustments/Labs and Tests Ordered: Current medicines are reviewed at length with the patient today.  Concerns regarding medicines are outlined above.   Tests Ordered: No orders of the defined types were placed in this encounter.   Medication Changes: No orders of the defined types were placed in this  encounter.   Disposition:  Follow up 3 months in the [place] office.  Signed, Satira Sark, MD, [facility] 09/19/2020 3:14 PM    [place] at [facility] [address], [place], [place] [address] Phone: [phone]; Fax: [phone]

## 2020-09-19 NOTE — Telephone Encounter (Signed)
Recommended continue Nadolol 20 mg per [facility].

## 2020-09-22 ENCOUNTER — Other Ambulatory Visit: Payer: Federal, State, Local not specified - PPO

## 2020-09-22 ENCOUNTER — Other Ambulatory Visit: Payer: Self-pay | Admitting: Critical Care Medicine

## 2020-09-22 DIAGNOSIS — Z20822 Contact with and (suspected) exposure to covid-19: Secondary | ICD-10-CM | POA: Diagnosis not present

## 2020-09-23 ENCOUNTER — Encounter (HOSPITAL_COMMUNITY): Payer: Self-pay

## 2020-09-23 ENCOUNTER — Ambulatory Visit (HOSPITAL_COMMUNITY): Payer: Federal, State, Local not specified - PPO

## 2020-09-23 DIAGNOSIS — K7460 Unspecified cirrhosis of liver: Secondary | ICD-10-CM | POA: Diagnosis not present

## 2020-09-23 DIAGNOSIS — Z23 Encounter for immunization: Secondary | ICD-10-CM | POA: Diagnosis not present

## 2020-09-23 LAB — NOVEL CORONAVIRUS, NAA: SARS-CoV-2, NAA: NOT DETECTED

## 2020-09-23 LAB — SARS-COV-2, NAA 2 DAY TAT

## 2020-09-23 LAB — SPECIMEN STATUS REPORT

## 2020-10-02 ENCOUNTER — Ambulatory Visit: Payer: Federal, State, Local not specified - PPO | Attending: Internal Medicine

## 2020-10-02 DIAGNOSIS — Z23 Encounter for immunization: Secondary | ICD-10-CM

## 2020-10-02 NOTE — Progress Notes (Signed)
   Covid-19 Vaccination Clinic  Name:  [name]    MRN: [id number] DOB: [birth year]  10/02/2020  [name] was observed post Covid-19 immunization for 15 minutes without incident. He was provided with Vaccine Information Sheet and instruction to access the V-Safe system.   [name] was instructed to call 911 with any severe reactions post vaccine: [name] Difficulty breathing  . Swelling of face and throat  . A fast heartbeat  . A bad rash all over body  . Dizziness and weakness   Immunizations Administered    Name Date Dose VIS Date Route   Pfizer COVID-19 Vaccine 10/02/2020  1:10 PM 0.3 mL 02/06/2019 Intramuscular   Manufacturer: [place]   Lot: [place]: [id number]

## 2020-10-03 DIAGNOSIS — Z6837 Body mass index (BMI) 37.0-37.9, adult: Secondary | ICD-10-CM | POA: Diagnosis not present

## 2020-10-03 DIAGNOSIS — I5030 Unspecified diastolic (congestive) heart failure: Secondary | ICD-10-CM | POA: Diagnosis not present

## 2020-10-03 DIAGNOSIS — G4733 Obstructive sleep apnea (adult) (pediatric): Secondary | ICD-10-CM | POA: Diagnosis not present

## 2020-10-03 DIAGNOSIS — K7581 Nonalcoholic steatohepatitis (NASH): Secondary | ICD-10-CM | POA: Diagnosis not present

## 2020-10-05 ENCOUNTER — Other Ambulatory Visit: Payer: Self-pay | Admitting: Cardiology

## 2020-10-07 DIAGNOSIS — H353122 Nonexudative age-related macular degeneration, left eye, intermediate dry stage: Secondary | ICD-10-CM | POA: Diagnosis not present

## 2020-10-08 NOTE — Progress Notes (Signed)
Referring Provider: [name], MD Primary Care Physician:  [name], MD Primary GI Physician: Dr. Gala Romney  Chief Complaint  Patient presents with  . Cirrhosis    f/u  . ida    f/u    HPI:   [name] is a 59 y.o. male with history of polyclonal gammopathy, type 2 diabetes, HTN, CKD, CAD s/p CABG in 5956, chronic diastolic heart failure, suspected NASH cirrhosis with known esophageal varices on nadolol, thrombocytopenia, chronic anemia with iron deficiency noted in January 2020 following with hematology (Feraheme infusions on 04/25/2020 and 05/02/2020), and adenomatous colon polyps.  History of multiple poor colon preps.  Last colonoscopy in April 2021 again with an inadequate prep (worse than prior exam in December 2020), rectal varices. Dr. Gala Romney did not recommend repeat colonoscopy locally due to multiple failed attempts. Recommended possible virtual colonoscopy vs referral to outside facility.  EGD December 2020 with 4 columns of grade 2-3 esophageal varices, portal gastropathy, gastric polyp/abnormal gastric mucosa biopsy.  Pathology with hyperplastic polyp, mild chronic gastritis, negative H. Pylori. 9 mm gallbladder polyp noted on [place] in February 2021 for which he was seen at [facility] surgery who recommended referral to [facility]. Last [place] in July with no mention of gallbladder polyp.   Patient was last seen 07/09/2020 following hospitalization for significant volume overload/anasarca in the setting of chronic diastolic CHF and cirrhosis. Also had mild hepatic encephalopathy on admission which improved with lactulose. Meld had increased to 20 during hospitalization. At the time of his office visit, swelling has significantly improved. He was down 69 pounds since day of hospital admission. Continue taking lactulose 30 mL daily which is working well. No other significant symptoms. No obvious GI bleeding. Plans to update labs, increase nadolol to 40 mg daily to attempt to  achieve heart rate 55-60, continue current diuretic dosing, continue lactulose, keep upcoming appointment with [facility] for cirrhosis and colonoscopy, follow-up in 3 months.  Office visit with [facility] on 07/15/2020. Recommended transplant evaluation and this process has been started  He was continued on his current medications.  He has received 2 doses of hepatitis A/B vaccine was given.  Patient did not have labs completed that I ordered. Reviewed additional labs completed in the system. LFTs within normal limits 07/10/2020. Total bilirubin stable at 1.8. Potassium was found to be 5.8 and creatinine up to 2.72 on 9/2.  Cardiology advised he stop potassium supplement completely and reduce Aldactone to 50 mg daily.  Repeat BMP 08/25/2020 with creatinine 2.78, potassium down to 5.2.  Cardiology recommended nephrology consult.  Patient established with nephrology 09/12/2020.  Potassium down to 4.4 on 10/7 [facility] Everywhere).   He has had multiple labs completed with [facility] and Nephrology in September and October which I have reviewed in [place]. Labs correspond to MELD 29, primarily driven by elevated creatinine at 3.15 and sodium low at 125. T. Bili stable at 1.6. INR stable at 1.1 in September. Hemoglobin has remained stable/improved at 12.8 on 09/18/20.  Today:  Hypotension: BP is around 90/60s at home. Taking Nadolol 40. Thought he was supposed to take this rather than the 20 mg. States he has both at home. No lightheadedness, dizziness, pre-syncope or syncope.   Cirrhosis: States he is on the transplant. States they told him he can get a call and would need to be there within an hour. No swelling in abdomen in LE. Taking torsemide 60 mg daily and aldactone 50 mg daily. Watching sodium closely. No more  than 1800 mg daily and no more than 2L of liquids daily. No confusion, difficulty thinking, or changes in mental status. Taking lactulose daily having 1-2 BMs daily. Sometimes taking 15 ml daily and other  days taking 30 ml daily. No nausea. No alcohol. No drug use.   Anemia: No blood in the stool or black stool. Stools can be dark on iron at times.   GERD: Taking omeprazole at bedtime. Having symptoms occasional at night. Maybe twice a week. Not eating within 3 hours of laying down. Had a hot dog last night and had mild reflux. 1 soda daily. No dysphagia.    Has appointment with Nephrology 10/10/20 and prefers to follow-up with them on kidney function and diuretic adjustments/additional labs needed. Also has several upcoming appointments with the transplant team at [facility], and appointment with [facility] on 10/21/20.   Past Medical History:  Diagnosis Date  . Anemia   . Arthritis   . CAD (coronary artery disease)    Multivessel disease status post CABG 08/2015  . Cirrhosis ([place])   . CKD (chronic kidney disease) stage 3, GFR 30-59 ml/min (HCC)   . Diastolic CHF ([place])   . Essential hypertension   . Hyperlipidemia   . OSA on CPAP   . Polyclonal gammopathy   . Thrombocytopenia ([place]) 2016  . Type 2 diabetes mellitus ([place])     Past Surgical History:  Procedure Laterality Date  . APPENDECTOMY    . Biceps tendon surgery Right   . BIOPSY  11/22/2019   Procedure: BIOPSY;  Surgeon: Daneil Dolin, MD;  [facility];  Service: Endoscopy;;  . CARDIAC CATHETERIZATION N/A 08/25/2015   Procedure: Left Heart Cath and Coronary Angiography;  Surgeon: Belva Crome, MD;  Location: [place] [facility];  Service: Cardiovascular;  Laterality: N/A;  . COLONOSCOPY WITH PROPOFOL N/A 11/22/2019   Procedure: COLONOSCOPY WITH PROPOFOL;  Surgeon: Daneil Dolin, MD; Four 4-5 mm polyps, findings suggestive of portal colopathy, congested appearing colonic mucosa diffusely, rectal varices, and adequate right colon prep.  Pathology with tubular adenomas and hyperplastic polyp.  Right colon biopsy with focal active colitis.  Recommendations to repeat colonoscopy in 3 months due to poor prep.  . COLONOSCOPY WITH  PROPOFOL N/A 03/17/2020   Procedure: COLONOSCOPY WITH PROPOFOL;  Surgeon: Daneil Dolin, MD;  Location: [facility];  Service: Endoscopy;  Laterality: N/A;  8:45am - pt does not need covid test, was + 2/4 <90 days  . CORONARY ARTERY BYPASS GRAFT N/A 08/29/2015   Procedure: CORONARY ARTERY BYPASS GRAFTING (CABG);  Surgeon: [name], MD;  Location: [place];  Service: Open Heart Surgery;  Laterality: N/A;  . ESOPHAGOGASTRODUODENOSCOPY (EGD) WITH PROPOFOL N/A 11/22/2019   Procedure: ESOPHAGOGASTRODUODENOSCOPY (EGD) WITH PROPOFOL;  Surgeon: Daneil Dolin, MD; 4 columns of grade 2-3 esophageal varices, portal gastropathy, gastric polyp/abnormal gastric mucosa s/p biopsy.  Pathology with hyperplastic polyp, mild chronic gastritis, negative H. pylori.  [name] KNEE ARTHROSCOPY Left   . TEE WITHOUT CARDIOVERSION N/A 08/29/2015   Procedure: TRANSESOPHAGEAL ECHOCARDIOGRAM (TEE);  Surgeon: [name], MD;  Location: [place];  Service: Open Heart Surgery;  Laterality: N/A;  . TOTAL KNEE ARTHROPLASTY Left 03/09/2017   Procedure: TOTAL KNEE ARTHROPLASTY;  Surgeon: Carole Civil, MD;  Location: [facility];  Service: Orthopedics;  Laterality: Left;    Current Outpatient Medications  Medication Sig Dispense Refill  . albuterol (VENTOLIN HFA) 108 (90 Base) MCG/ACT inhaler Inhale 2 puffs into the lungs every  6 (six) hours as needed for wheezing or shortness of breath. 18 g 0  . aspirin EC 81 MG tablet Take 81 mg by mouth at bedtime.     . Cholecalciferol (DIALYVITE VITAMIN D 5000) 125 MCG (5000 UT) capsule Take 5,000 Units by mouth in the morning.     [name] Bandages & Supports (MEDICAL COMPRESSION STOCKINGS) MISC 1 each by Does not apply route as directed. Low pressure knee high compression stockings Dx: leg swelling 1 each 0  . ELDERBERRY PO Take by mouth at bedtime. With  Vitamin c and zinc     . ferrous sulfate 325 (65 FE) MG tablet Take 325 mg by mouth in the morning.     . Flaxseed, Linseed,  (FLAXSEED OIL PO) Take 1,400 mg by mouth at bedtime.     . insulin aspart (NOVOLOG) 100 UNIT/ML injection Inject 5-30 Units into the skin 3 (three) times daily before meals.     . lactulose (CHRONULAC) 10 GM/15ML solution Take 30 mLs (20 g total) by mouth daily. 236 mL 0  . loratadine (CLARITIN) 10 MG tablet Take 10 mg by mouth in the morning.     . Magnesium 250 MG TABS Take 250 mg by mouth daily.    . nadolol (CORGARD) 20 MG tablet Take 20 mg by mouth daily.    [name] omeprazole (PRILOSEC) 40 MG capsule Take 40 mg by mouth at bedtime.  2  . pravastatin (PRAVACHOL) 20 MG tablet TAKE 1 TABLET(20 MG) BY MOUTH AT BEDTIME 90 tablet 3  . spironolactone (ALDACTONE) 100 MG tablet Take 0.5 tablets (50 mg total) by mouth daily. 30 tablet 2  . torsemide (DEMADEX) 20 MG tablet Take 60 mg by mouth daily. Take 3 tablets (60 mg ) daily    . traMADol-acetaminophen (ULTRACET) 37.5-325 MG tablet Take 1 tablet by mouth every 4 (four) hours as needed for severe pain.    . TRESIBA FLEXTOUCH 200 UNIT/ML SOPN Inject 40-70 Units into the skin See admin instructions. Inject 70 units into the skin in the morning and 40 units at bedtime    . TURMERIC CURCUMIN PO Take 2,000 mg by mouth at bedtime.     . Armodafinil 200 MG TABS Take 200 mg by mouth daily as needed (for sleepiness). (Patient not taking: Reported on 09/19/2020)     No current facility-administered medications for this visit.    Allergies as of 10/09/2020  . (No Known Allergies)    Family History  Problem Relation Age of Onset  . Arthritis Other   . Cancer Other   . Diabetes Other   . CAD Father   . Diabetes Mellitus II Father   . Liver cancer Father   . Hodgkin's lymphoma Father   . CAD Brother   . Diabetes Mellitus II Brother   . ALS Mother   . Diabetes Mellitus II Sister   . Diabetes Mellitus II Brother   . Diabetes Mellitus II Maternal Grandmother   . Aneurysm Maternal Grandmother   . Cancer Maternal Grandfather   . Anesthesia problems Neg Hx    . Hypotension Neg Hx   . Malignant hyperthermia Neg Hx   . Pseudochol deficiency Neg Hx   . Colon cancer Neg Hx     Social History   Socioeconomic History  . Marital status: Married    Spouse name: Not on file  . Number of children: 0  . Years of education: college  . Highest education level: Not on file  Occupational  History  . Occupation: Maintenance tech    Employer: [employer]  Tobacco Use  . Smoking status: Never Smoker  . Smokeless tobacco: Never Used  Vaping Use  . Vaping Use: Never used  Substance and Sexual Activity  . Alcohol use: No    Alcohol/week: 0.0 standard drinks  . Drug use: No  . Sexual activity: Not Currently  Other Topics Concern  . Not on file  Social History Narrative  . Not on file   Social Determinants of Health   Financial Resource Strain:   . Difficulty of Paying Living Expenses: Not on file  Food Insecurity:   . Worried About Charity fundraiser in the Last Year: Not on file  . Ran Out of Food in the Last Year: Not on file  Transportation Needs:   . Lack of Transportation (Medical): Not on file  . Lack of Transportation (Non-Medical): Not on file  Physical Activity:   . Days of Exercise per Week: Not on file  . Minutes of Exercise per Session: Not on file  Stress:   . Feeling of Stress : Not on file  Social Connections:   . Frequency of Communication with Friends and Family: Not on file  . Frequency of Social Gatherings with Friends and Family: Not on file  . Attends Religious Services: Not on file  . Active Member of Clubs or Organizations: Not on file  . Attends Archivist Meetings: Not on file  . Marital Status: Not on file    Review of Systems: Gen: Denies fever, chills, cold or flulike symptoms, lightheadedness, dizziness, presyncope, syncope. CV: Denies chest pain or heart palpitations Resp: Denies dyspnea or cough. GI: See HPI Heme: See HPI  Physical Exam: BP (!) 89/59   Pulse 79   Temp 98 F (36.7  C)   Ht 5\' 9"  (1.753 m)   Wt 250 lb 6.4 oz (113.6 kg)   BMI 36.98 kg/m  General:   Alert and oriented. No distress noted. Pleasant and cooperative.  Head:  Normocephalic and atraumatic. Eyes:  Conjuctiva clear without scleral icterus. Heart:  S1, S2 present without murmurs appreciated. Lungs:  Clear to auscultation bilaterally. No wheezes, rales, or rhonchi. No distress.  Abdomen:  +BS, soft, non-tender and non-distended. No rebound or guarding. No HSM or masses noted. Msk:  Symmetrical without gross deformities. Normal posture. Extremities:  Without edema. Neurologic:  Alert and  oriented x4 Psych: Normal mood and affect.

## 2020-10-09 ENCOUNTER — Encounter: Payer: Self-pay | Admitting: Internal Medicine

## 2020-10-09 ENCOUNTER — Encounter: Payer: Self-pay | Admitting: Gastroenterology

## 2020-10-09 ENCOUNTER — Other Ambulatory Visit: Payer: Self-pay

## 2020-10-09 ENCOUNTER — Ambulatory Visit (INDEPENDENT_AMBULATORY_CARE_PROVIDER_SITE_OTHER): Payer: Federal, State, Local not specified - PPO | Admitting: Gastroenterology

## 2020-10-09 VITALS — BP 89/59 | HR 79 | Temp 98.0°F | Ht 69.0 in | Wt 250.4 lb

## 2020-10-09 DIAGNOSIS — K219 Gastro-esophageal reflux disease without esophagitis: Secondary | ICD-10-CM | POA: Diagnosis not present

## 2020-10-09 DIAGNOSIS — K7460 Unspecified cirrhosis of liver: Secondary | ICD-10-CM

## 2020-10-09 DIAGNOSIS — D649 Anemia, unspecified: Secondary | ICD-10-CM | POA: Diagnosis not present

## 2020-10-09 NOTE — Patient Instructions (Addendum)
Stop nadolol 40 mg daily and start nadolol to 20 mg daily.   Please continue to check your blood pressure daily at home and let me know if your blood pressure does not come up to at least 90/60.  Please return to our office in 1 week for a blood pressure recheck.   Continue lactulose. Take 102ml every morning. If you haven't had 3 BMs by the evening, you may take an additional 15 ml in the evening.   Continue Torsemide 60 mg daily and spironolactone 50 mg daily.  Continue following closely with nephrology and cardiology for management of diuretics.   Continue low sodium diet, no more than 1800 mg daily.   Please increase your protein intake. You should consume at minimum 113 g of protein daily.   Please start taking omeprazole 40 mg 30 minutes before breakfast to see if this helps control your GERD symptoms better. Let me know if you have any ongoing problems with reflux.   Follow a GERD diet:  Avoid fried, fatty, greasy, spicy, citrus foods. Avoid caffeine and carbonated beverages. Avoid chocolate. Try eating 4-6 small meals a day rather than 3 large meals. Do not eat within 3 hours of laying down. Prop head of bed up on wood or bricks to create a 6 inch incline.  We will follow-up with you in 6 months. If you have any significant changes you can feel free to contact us, but you should also contact [facility] as you are being evaluated for transplant.   It was great seeing you today!  Aliene Altes, PA-C [facility]

## 2020-10-10 ENCOUNTER — Other Ambulatory Visit (HOSPITAL_COMMUNITY)
Admission: RE | Admit: 2020-10-10 | Discharge: 2020-10-10 | Disposition: A | Discharge status: Home / Self Care | Payer: Federal, State, Local not specified - PPO | Source: Ambulatory Visit | Attending: Nephrology | Admitting: Nephrology

## 2020-10-10 ENCOUNTER — Other Ambulatory Visit: Payer: Self-pay

## 2020-10-10 DIAGNOSIS — D696 Thrombocytopenia, unspecified: Secondary | ICD-10-CM | POA: Diagnosis not present

## 2020-10-10 DIAGNOSIS — N170 Acute kidney failure with tubular necrosis: Secondary | ICD-10-CM | POA: Insufficient documentation

## 2020-10-10 DIAGNOSIS — E211 Secondary hyperparathyroidism, not elsewhere classified: Secondary | ICD-10-CM | POA: Insufficient documentation

## 2020-10-10 DIAGNOSIS — N189 Chronic kidney disease, unspecified: Secondary | ICD-10-CM | POA: Diagnosis not present

## 2020-10-10 DIAGNOSIS — E871 Hypo-osmolality and hyponatremia: Secondary | ICD-10-CM | POA: Insufficient documentation

## 2020-10-10 DIAGNOSIS — E1122 Type 2 diabetes mellitus with diabetic chronic kidney disease: Secondary | ICD-10-CM | POA: Insufficient documentation

## 2020-10-10 LAB — RENAL FUNCTION PANEL
Albumin: 3.4 g/dL — ABNORMAL LOW (ref 3.5–5.0)
Anion gap: 9 (ref 5–15)
BUN: 62 mg/dL — ABNORMAL HIGH (ref 6–20)
CO2: 21 mmol/L — ABNORMAL LOW (ref 22–32)
Calcium: 9 mg/dL (ref 8.9–10.3)
Chloride: 97 mmol/L — ABNORMAL LOW (ref 98–111)
Creatinine, Ser: 2.73 mg/dL — ABNORMAL HIGH (ref 0.61–1.24)
GFR, Estimated: 26 mL/min — ABNORMAL LOW (ref >60)
Glucose, Bld: 260 mg/dL — ABNORMAL HIGH (ref 70–99)
Phosphorus: 3.5 mg/dL (ref 2.5–4.6)
Potassium: 5 mmol/L (ref 3.5–5.1)
Sodium: 127 mmol/L — ABNORMAL LOW (ref 135–145)

## 2020-10-13 ENCOUNTER — Other Ambulatory Visit: Payer: Self-pay | Admitting: Cardiology

## 2020-10-14 ENCOUNTER — Encounter: Payer: Self-pay | Admitting: Gastroenterology

## 2020-10-14 NOTE — Assessment & Plan Note (Addendum)
59 year old male diagnosed with cirrhosis in January/February 2021 likely secondary to NAFLD/NASH.  Report of heavy alcohol use prior to 1982.  Associated significant thrombocytopenia. Hospitalized in July 2021 with mild hepatic encephalopathy and anasarca which was multifactorial in setting cirrhosis and acute on chronic diastolic CHF.  He has maintained on lactulose without encephalopathy.  Cardiology has been primarily managing diuretics with Demadex 60 mg twice daily.  He has also been on Aldactone.  Unfortunately, his meld score has been steadily worsening, primarily driven by worsening kidney function.  He had mild hyperkalemia in September and cardiology reduce Aldactone to 50 mg daily.  He established with nephrology 09/12/2020.  Most recent labs 09/18/2020 with creatinine up to 3.15, sodium 125, potassium 4.4.  These findings have driven MELD up to 29. He is currently following with [facility] transplant and per patient, he has been listed for transplant.  Has also received first 2 doses of Hep A/B vaccine with [facility]. EGD in December 2020 with 4 columns of grade 2-3 esophageal varices and portal gastropathy. Denies overt GI bleeding. He has continued on nadolol 40 mg rather than previously recommended 20 mg and his blood pressure is 89/59 today (asymptomatic) with pulse of 79. He has no significant LE edema or appreciable ascites on exam.   His Demadex likely needs to be reduced. Patient would like to see his nephrologist tomorrow as scheduled as he will likely have to have repeat labs and will discuss with diuretics at that time. Nadolol will also need to be reduced due to hypotension.   Plan: 1. Decrease nadolol to 20 mg daily. 2. Request that he keep a close check on his blood pressure at home and let me know if BP does not come up to at least 90/60.  Also requested he return to our office in 1 week for blood pressure recheck. 3. Needs updated BMP but prefers to wait until seeing nephrology. 4. Continue  Aldactone 50 mg daily. 5. Continue Demadex 60 mg daily for now pending follow-up with nephrology tomorrow.  This medication likely needs to be reduced due to worsening kidney function. 6. Continue 1800 mg daily sodium restriction.  7. Continue lactulose with 30 ml daily and an extra 15 ml in the evening if needed. Titrate to achieve 3-4 BMs daily.  8. Increase protein intake with at least 1g/kg daily (113 g protein daily).    9. Due for routine cirrhosis labs in March 2022 as well as routine ultrasound in March 2022.  He is likely to have labs updated prior to this with close monitoring by [facility].  10. Will discuss with Dr. Gala Romney whether or not he recommends repeat EGD for variceal surveillance as heart rate is not at goal on nadolol. 11. Follow-up in our office in 6 months as patient is being followed closely with [facility]/Transplant team with frequent evaluation. He has follow-up with [facility]/transplant clinic on 11/9 and 12/21 currently.  Advised he call with any questions or concerns prior to his next visit.

## 2020-10-14 NOTE — Assessment & Plan Note (Addendum)
59 year old male with history of anemia at least since 2016 with diagnosis of iron deficiency in January 2020 starting on oral iron at that time.  FOBT negative in February 2020.  Previously evaluated by hematology/oncology including a bone marrow biopsy in February 2020 without identified etiology.  He does have polyclonal gammopathy and oncology felt anemia could be related to autoimmune conditions with recommendations to see allergy/rheumatology.  Celiac serologies negative.  EGD December 2020 with 4 columns of grade 2-3 esophageal varices, portal gastropathy, gastric polyp/abnormal gastric mucosa s/p biopsy revealing hyperplastic polyp with mild chronic gastritis, no H. pylori.  He had 3 tubular adenomas removed from his colon in December 2020, portal colopathy, and rectal varices.  He had a poor colon prep.  Reattempted colonoscopy in April 2021 but again with inadequate prep. Received IV iron x2 in May 2021 and has continued on oral iron.  Hematology also following for significant thrombocytopenia.  Most recent labs with hemoglobin stable/improved at 12.8, platelets 77 on 09/18/2020.  He continues to deny any overt GI bleeding.  Anemia is multifactorial in the setting of chronic disease, CKD, and cirrhosis with portal gastropathy/portal colopathy and significant thrombocytopenia.  As colonoscopies have been incomplete due to poor prep, cannot rule out colonic etiology.  We have previously recommended considering colonoscopy with [facility] as he has been referred there due to cirrhosis.  This was not discussed at his initial consult. Will try to touch base with [facility] to ensure they are aware patient is in need of colonoscopy due to failed attempts locally.  As his hemoglobin is stable/improved, will hold off on small bowel evaluation for now.  May need to consider this in the future to complete GI evaluation if hemoglobin begins to trend down and colonoscopy is unrevealing. He will continue with oral iron and  following with hematology. Follow-up in 6 months.

## 2020-10-14 NOTE — Assessment & Plan Note (Signed)
Chronic history of GERD.  Symptoms are fairly well controlled on omeprazole 40 mg daily.  He reports intermittent breakthrough symptoms a couple times a week occurring at night.  Notably, patient is taking omeprazole right before bedtime.  Trying to avoid fattier foods but does consume these occasionally which causes mild reflux.  Continues to drink 1 soda daily.  No alarm symptoms.  Plan: Advised to start taking omeprazole 40 mg 30 minutes before breakfast. Also counseled on GERD diet/lifestyle:  Avoid fried, fatty, greasy, spicy, citrus foods.  Avoid caffeine and carbonated beverages.  Avoid chocolate.  Try eating 4-6 small meals a day rather than 3 large meals.  Do not eat within 3 hours of laying down.  Prop head of bed up on wood or bricks to create a 6 inch incline. Advised to let me know of any ongoing issues with acid reflux. We will plan to follow-up in 6 months or sooner if needed.

## 2020-10-16 ENCOUNTER — Other Ambulatory Visit: Payer: Self-pay

## 2020-10-16 ENCOUNTER — Ambulatory Visit (INDEPENDENT_AMBULATORY_CARE_PROVIDER_SITE_OTHER): Payer: Federal, State, Local not specified - PPO | Admitting: Neurology

## 2020-10-16 ENCOUNTER — Encounter: Payer: Self-pay | Admitting: Neurology

## 2020-10-16 VITALS — BP 117/77 | HR 74 | Ht 70.5 in | Wt 250.0 lb

## 2020-10-16 DIAGNOSIS — K7460 Unspecified cirrhosis of liver: Secondary | ICD-10-CM

## 2020-10-16 DIAGNOSIS — I214 Non-ST elevation (NSTEMI) myocardial infarction: Secondary | ICD-10-CM

## 2020-10-16 DIAGNOSIS — I5033 Acute on chronic diastolic (congestive) heart failure: Secondary | ICD-10-CM | POA: Diagnosis not present

## 2020-10-16 DIAGNOSIS — Z9989 Dependence on other enabling machines and devices: Secondary | ICD-10-CM

## 2020-10-16 DIAGNOSIS — E1165 Type 2 diabetes mellitus with hyperglycemia: Secondary | ICD-10-CM | POA: Diagnosis not present

## 2020-10-16 DIAGNOSIS — Z951 Presence of aortocoronary bypass graft: Secondary | ICD-10-CM

## 2020-10-16 DIAGNOSIS — R188 Other ascites: Secondary | ICD-10-CM

## 2020-10-16 DIAGNOSIS — G4733 Obstructive sleep apnea (adult) (pediatric): Secondary | ICD-10-CM

## 2020-10-16 DIAGNOSIS — I6523 Occlusion and stenosis of bilateral carotid arteries: Secondary | ICD-10-CM

## 2020-10-16 DIAGNOSIS — E66813 Obesity, class 3: Secondary | ICD-10-CM

## 2020-10-16 MED ORDER — ARMODAFINIL 200 MG PO TABS
200.0000 mg | ORAL_TABLET | Freq: Every day | ORAL | 2 refills | Status: DC | PRN
Start: 2020-10-16 — End: 2020-11-13

## 2020-10-16 NOTE — Progress Notes (Signed)
SLEEP MEDICINE CLINIC   Provider:  Larey Seat, M D  Referring Provider: [name], MD Primary Care Physician:  [name], MD      Interval history : 10-16-2020: I have the pleasure of meeting today with [name] who is called [name].  He is a meanwhile 59 year old and some 59 year old Caucasian gentleman who has followed in our practice for over 5 years with a diagnosis of obstructive sleep apnea snoring and being treated on CPAP.  His risk factors have always included morbid obesity. He states that he was recently told that he will need a liver transplant in response to a NASH nonalcoholic steatosis of the liver. Liver is cirrhotic. Had pancreatic failure-  He states that his pancreas makes neither digestive enzymes nor insulin. His diabetes is better controlled - he has a no needle check device.   He endorsed today the Epworth Sleepiness Scale at only 4 points which is a remarkable response in the past he had sometimes been excessively daytime sleepy.  His fatigue score was high also at 53 out of 63 points and he stated today that he does feel sometimes depressed.  This is a score of 3 out of 15 points so a mild depression.  His C-Flex machine was interrogated today is a DreamStation auto CPAP this device had been recalled.  He has used it compliantly for 97% of the last 30 days usual average daily use is 4 hours 42 minutes only but he achieves a reduced AHI of only 0.3/h and his CPAP is set at 9 cm water.  There are sometimes air leaks but these seem to be related to movement going to the bathroom etc.  Coumadin humidification setting has been medium, he has a C-Flex function of 1 cm water, the equivalent of an expiratory pressure relief.  He is on 10 cm ramp starting at 4 cmH2O pressure and reaching his max pressure of 9 cm.  He never used an ozone cleaner- his machine doesn't make noise and he has not noted particles in his water-chamber.  Due to supply chain  delays, I cannot promise a replacement device to reach him  before January anyway. So I will order an HST to establish his baseline this year and order his new machine based on that- in the meantime he will continue to use the [employer] device.       06-19-2019. This is a revisit for [name],  59 year old caucasian right handed married male with Bypass surgery vessels 4.  Presenting with CPAP compliance data  And is suing OSA treatment by CPAP with since January 2017. He reached a BMI 38.31 kg/m2. under the corona pandemic he reported having less work and he is feeling well.  The patient is followed by our [facility] and uses a dream station auto CPAP.  30 days included the last night to be the night to 19 June 2019 with 100% days of use and 87% of Spiriva 4 hours of consecutive use.  The average AHI was 0.3 with a CPAP set at 9 cm water pressure.  Ramp time was 10 minutes and stop pressure is 5 cm water.  1 cm C-Flex setting which equals expiratory pressure relief in this model. In our last visit we had also discussed help to help with the excessive daytime fatigue and I suggested to try Nuvigil.  Today's Epworth score is endorsed at only 4 out of 24 points and seems to have improved greatly,  the fatigue severity scale is still at 42 out of 63 points but this has been actually present for well over 3 years.     HPI:  [name] is a 59 y.o. male , who had been seen here on 03-03-2015 upon  referral from Dr. Hilma Favors for a sleep evaluation the first time. .   [name] states that he is here because his physician and his spouse are concerned about his sleep apnea. About 7 weeks ago he had presented with chest tightness and was told that he had significant coronary artery disease. Some of his vessels were 95% occluded. He underwent bypass surgery on 08-29-15, and it was in the hospital that he was observed having apnea by his nurses as well as by his spouse. I would like to  allude that the patient has other risk factors such as diabetes, hypertension, hypercholesterolemia and that he used to be morbidly obese but lost about 80 pounds within the last 12 months. The patient acknowledges that he feels fatigued and daytime sleepy also this varies day by day. Often his sleep is not refreshing nonrestorative. His wife has noticed him to snore as well as witnessed apneas. Sleep habits are as follows:The patient retreats usually between 9 and 10 at night to the bedroom, he is promptly asleep. The bedroom is described as core, quiet and dark. He shares the bedroom in bed with his wife. His wife has not mentioned any restlessness kicking or thrashing. He prefers to sleep on his side and states that he cannot sleep on his back. He usually wakes up every 2 hours or so goes to the bathroom. 2-3 times at night. He goes promptly to sleep again. He rises in the morning at 6 AM and wakes spontaneously without alarm. He has not noticed a dry mouth in the morning, he may sometimes have a morning headache. He reports no numbness, no jaw pain. He has never been woken up by headaches from sleep. He reports that he dreams also his dreams are not acted out upon, nor are extremely vivid or Water quality scientist. When he wakes up he is often on his back and not on his side. Sleep medical history and family sleep history: [name] father suffered from coronary artery disease and died of multiorgan failure. [name] younger brother also has undergone bypass surgery for coronary artery disease. His brother also has been diagnosed with obstructive sleep apnea. Social history: married, disabled due to heart disease. He does not use tobacco products and does not drink alcohol, he drinks mainly water and has 1 or 2 coffees in the morning. No soda or iced tea.   Interval history from 03/02/2016  We are meeting today to follow-up on a recent sleep study;  the patient underwent polysomnography  on 11/30/2015 and was diagnosed with mild to moderate apnea at an AHI of 16.5, REM AHI of 30.7, supine AHI of 90.6. He also had the oxygen nadir as low as 79% saturation was 30 minutes of desaturation time.  For this reason I suggested strongly to use CPAP rather than a dental device. The CPAP titration followed on 12/30/2015 and reduced the AHI to 1.3 the patient did best at 9 cm water pressure was 17 m EPR I recommended an air-fit P 10 in medium size with heated humidity. The patient's compliance download today shows 100% compliance for the last 30 days 97% compliance for use over 4 hours average user time is 7 hours 31 minutes  and the patient has an AHI of 0.6 at 9 cm water pressure. This is an almost complete alleviation there is no significant air leak and he has been using the same unit humidity settings for the last month. He endorsed today the Epworth sleepiness score at 4 points and the fatigue severity score at 17 points.  Interval history from 03/02/2017, the patient continues to use CPAP at 100% compliance with an average of 6 hours and 38 minutes of nightly use, with a residual AHI of 0.4 at a setting of 9 cm water pressure. 100% compliant by time and number of days -his machine should be WiFi compatible more actual data should be obtained.  He assured me that he much more alert, able to multitask, concentrated and that his Epworth sleepiness score is 3 points, his fatigue severity score is 12 points. He lost 30 pounds since last year, he reported.   Interval history from 06 March 2018, I have the pleasure of seeing [name] today for his yearly follow-up.  His DME is [facility], he has been followed in this practice since 2016.  He is a very compliant CPAP user but this last 30-day download had a couple of gap days.  On 5 days he did not use his device he had a power outage.  Overall his compliance is still very good at 85%, average AHI is 0.7.  CPAP is set at 9 cm water pressure.  His  ramp starts over 10 minutes at 5 cmH2O and he uses a 1 cm C-Flex setting.  Humidifier is set at level 2, the patient still endorses a moderate degree of fatigue and a SSS of 39, daytime sleepiness however is not present the Epworth sleepiness score was endorsed at 6 points. His sleep habits have been impaired by a shoulder injury- he cannot sleep on his preferred left side, neither on his back.  He had surgery for rotator cuff injury. No problems with anesthesia and waking up was uncomplicated.     VIDEO VISIT - 23 minutes.  Interval history from 12 March 2019.  This is a virtual visit with the patient [name], I meanwhile 59 year old Caucasian male with a history of coronary artery disease status post four-vessel bypass, anemia followed by oncology-hematology and obstructive sleep apnea on CPAP.  In preparation for this video conference call we asked [name] to endorse the Epworth sleepiness score, he did this at 20 out of 24 possible points, he also endorsed fatigue and acknowledged some depression to be present.  He continues to have ankle edema has a history of right heart dysfunction, but he denies any interval history of atrial fibrillation, stroke, chest pain or shortness of breath.  I asked him questions about possible cataplectic symptoms, which he denied.  He reports that he sleeps between 8 and 9 hours each night, and that his wife has noted that he sometimes seems to still gasp for breath and may be wake up or seemingly wake up for a couple of seconds but then resumes sleeping quietly and restfully.  He does not feel that he has extreme REM pressure he dreams vividly but not in naps, he had isolated cases of sleep paralysis.  His main problem is that he sleeps when he is not stimulated or physically active.  I reviewed his sleep records and his CPAP download, he has been my patient since 2016 he is using a [employer] C-Flex machine through [facility] his 100% compliance average  user  time 8 hours 45 minutes, he does have occasional air leakage but his residual AHI is only 0.4 events per hour of sleep at a CPAP setting of 9 cmH2O with an expiratory pressure relief setting of 1 cm.  Ramp time is 10 minutes start pressure of 5.  He was evaluated in May of last year by Dr. Conni Elliot for lower extremity edema and had a transthoracic echocardiography at [facility], this was compared to a previous study from June 2018.   Left ventricle was normal with very mild left ventricular hypertrophy, systolic function was normal with an ejection fraction of about 60%, aortic valve has mild calcification mild stenosis and no significant regurgitation.  Mildly calcified annulus of the mitral valve noted.  Right and left atrium were normal in size, pericardium appeared normal Dr. Conni Elliot felt that his right heart function was likely low normal due to obesity and obstructive sleep apnea.  I would consider the sleep apnea however to be well treated.  The patient's sleep study PSG took place on 30 November 2015 with an AHI of 16.5/h during REM sleep of 30.7/h oxygen nadir 79% SPO2.   This was followed by a CPAP titration on 30 December 2015, he did excellent at 9 cmH2O was 1 cm EPR and uses a ResMed air fit P 10 and medium size.  In my last visit with the patient in March 2019 I had also asked him to consider medical weight loss and wellness program here at [facility] health.    I reviewed the patient's medication and his hematology oncology visit.  He was seen by Dr. Lorna Few on 30 December 2018 remarkable for persistent dry cough shortness of breath with exertion but no chest pain.  No night sweats.  Hemoglobin was 10.1 hematocrit 30.2% platelet count 80 7K.  Normal quantitative immunoglobulin.  IgA was slightly elevated  I reviewed the patient's current medication which includes tramadol as needed I think this was actually prescribed after a knee surgery and may not be his daily  routine. Given that the patient has persistent hypersomnia while being well treated for obstructive sleep apnea on CPAP and has been highly compliant with this therapy, I am not sure if his continued sleepiness and fatigue at this point related to anemia or depression.  It seems that the heart function was not a big part.  I spoke with the patient about his safety when driving and he is concerned about his sleepiness.  For this reason I would consider prescribing modafinil or armodafinil.  I explained to the patient that it should not be taken with caffeine, that it is usually blood pressure neutral, that it has to be taken 12 hours before intended bedtime to not cause insomnia.  The patient agreed with a trial and I advised him that I will also inform Dr. Conni Elliot of his treatmen  Review of Systems: 11-04-201Out of a complete 14 system review, the patient complains of only the following symptoms, and all other reviewed systems are negative.  How likely are you to doze in the following situations: 0 = not likely, 1 = slight chance, 2 = moderate chance, 3 = high chance  Sitting and Reading? Watching Television? Sitting inactive in a public place (theater or meeting)? Lying down in the afternoon when circumstances permit? Sitting and talking to someone? Sitting quietly after lunch without alcohol? In a car, while stopped for a few minutes in traffic? As a passenger in a car for an hour without a  break?  Total =      Epworth score was 6 in 2019 and is not 20/24 while comlpiant on CPAP THERAPY,  FSS remains 39 points, no nocturia. Ankle edema and right heart dysfunction.   No dream pressure. No chest pain, no SOB.  Agrees to feeling depressed and fatigued but less sleepy on NUVIGIL>     Average sleep time each night is 8-9 hours, no dream enactment, no cataplexy, rare sleep paralysis.  How likely are you to doze in the following situations: 0 = not likely, 1 = slight chance, 2 = moderate  chance, 3 = high chance  Sitting and Reading?1 Watching Television?1 Sitting inactive in a public place (theater or meeting)?1 Lying down in the afternoon when circumstances permit? Sitting and talking to someone? Sitting quietly after lunch without alcohol? In a car, while stopped for a few minutes in traffic? As a passenger in a car for an hour without a break?1  Total = 4/ 24      Social History   Socioeconomic History  . Marital status: Married    Spouse name: Not on file  . Number of children: 0  . Years of education: college  . Highest education level: Not on file  Occupational History  . Occupation: Maintenance tech    Employer: [employer]  Tobacco Use  . Smoking status: Never Smoker  . Smokeless tobacco: Never Used  Vaping Use  . Vaping Use: Never used  Substance and Sexual Activity  . Alcohol use: No    Alcohol/week: 0.0 standard drinks  . Drug use: No  . Sexual activity: Not Currently  Other Topics Concern  . Not on file  Social History Narrative  . Not on file   Social Determinants of Health   Financial Resource Strain:   . Difficulty of Paying Living Expenses: Not on file  Food Insecurity:   . Worried About Charity fundraiser in the Last Year: Not on file  . Ran Out of Food in the Last Year: Not on file  Transportation Needs:   . Lack of Transportation (Medical): Not on file  . Lack of Transportation (Non-Medical): Not on file  Physical Activity:   . Days of Exercise per Week: Not on file  . Minutes of Exercise per Session: Not on file  Stress:   . Feeling of Stress : Not on file  Social Connections:   . Frequency of Communication with Friends and Family: Not on file  . Frequency of Social Gatherings with Friends and Family: Not on file  . Attends Religious Services: Not on file  . Active Member of Clubs or Organizations: Not on file  . Attends Archivist Meetings: Not on file  . Marital Status: Not on file  Intimate Partner  Violence:   . Fear of Current or Ex-Partner: Not on file  . Emotionally Abused: Not on file  . Physically Abused: Not on file  . Sexually Abused: Not on file    Family History  Problem Relation Age of Onset  . Arthritis Other   . Cancer Other   . Diabetes Other   . CAD Father   . Diabetes Mellitus II Father   . Liver cancer Father   . Hodgkin's lymphoma Father   . CAD Brother   . Diabetes Mellitus II Brother   . ALS Mother   . Diabetes Mellitus II Sister   . Diabetes Mellitus II Brother   . Diabetes Mellitus II Maternal Grandmother   .  Aneurysm Maternal Grandmother   . Cancer Maternal Grandfather   . Anesthesia problems Neg Hx   . Hypotension Neg Hx   . Malignant hyperthermia Neg Hx   . Pseudochol deficiency Neg Hx   . Colon cancer Neg Hx     Past Medical History:  Diagnosis Date  . Anemia   . Arthritis   . CAD (coronary artery disease)    Multivessel disease status post CABG 08/2015  . Cirrhosis ([place])   . CKD (chronic kidney disease) stage 3, GFR 30-59 ml/min (HCC)   . Diastolic CHF ([place])   . Essential hypertension   . Hyperlipidemia   . OSA on CPAP   . Polyclonal gammopathy   . Thrombocytopenia ([place]) 2016  . Type 2 diabetes mellitus ([place])     Past Surgical History:  Procedure Laterality Date  . APPENDECTOMY    . Biceps tendon surgery Right   . BIOPSY  11/22/2019   Procedure: BIOPSY;  Surgeon: Daneil Dolin, MD;  [facility];  Service: Endoscopy;;  . CARDIAC CATHETERIZATION N/A 08/25/2015   Procedure: Left Heart Cath and Coronary Angiography;  Surgeon: Belva Crome, MD;  Location: [place] CV LAB;  Service: Cardiovascular;  Laterality: N/A;  . COLONOSCOPY WITH PROPOFOL N/A 11/22/2019   Procedure: COLONOSCOPY WITH PROPOFOL;  Surgeon: Daneil Dolin, MD; Four 4-5 mm polyps, findings suggestive of portal colopathy, congested appearing colonic mucosa diffusely, rectal varices, and adequate right colon prep.  Pathology with tubular adenomas and  hyperplastic polyp.  Right colon biopsy with focal active colitis.  Recommendations to repeat colonoscopy in 3 months due to poor prep.  . COLONOSCOPY WITH PROPOFOL N/A 03/17/2020   Procedure: COLONOSCOPY WITH PROPOFOL;  Surgeon: Daneil Dolin, MD;  [facility];  Service: Endoscopy;  Laterality: N/A;  8:45am - pt does not need covid test, was + 2/4 <90 days  . CORONARY ARTERY BYPASS GRAFT N/A 08/29/2015   Procedure: CORONARY ARTERY BYPASS GRAFTING (CABG);  Surgeon: [name], MD;  Location: [place];  Service: Open Heart Surgery;  Laterality: N/A;  . ESOPHAGOGASTRODUODENOSCOPY (EGD) WITH PROPOFOL N/A 11/22/2019   Procedure: ESOPHAGOGASTRODUODENOSCOPY (EGD) WITH PROPOFOL;  Surgeon: Daneil Dolin, MD; 4 columns of grade 2-3 esophageal varices, portal gastropathy, gastric polyp/abnormal gastric mucosa s/p biopsy.  Pathology with hyperplastic polyp, mild chronic gastritis, negative H. pylori.  [name] KNEE ARTHROSCOPY Left   . TEE WITHOUT CARDIOVERSION N/A 08/29/2015   Procedure: TRANSESOPHAGEAL ECHOCARDIOGRAM (TEE);  Surgeon: [name], MD;  Location: [place];  Service: Open Heart Surgery;  Laterality: N/A;  . TOTAL KNEE ARTHROPLASTY Left 03/09/2017   Procedure: TOTAL KNEE ARTHROPLASTY;  Surgeon: Carole Civil, MD;  Location: [facility];  Service: Orthopedics;  Laterality: Left;    Current Outpatient Medications  Medication Sig Dispense Refill  . albuterol (VENTOLIN HFA) 108 (90 Base) MCG/ACT inhaler Inhale 2 puffs into the lungs every 6 (six) hours as needed for wheezing or shortness of breath. 18 g 0  . Armodafinil 200 MG TABS Take 200 mg by mouth daily as needed (for sleepiness).    [name] aspirin EC 81 MG tablet Take 81 mg by mouth at bedtime.     . Cholecalciferol (DIALYVITE VITAMIN D 5000) 125 MCG (5000 UT) capsule Take 5,000 Units by mouth in the morning.     [name] Bandages & Supports (MEDICAL COMPRESSION STOCKINGS) MISC 1 each by Does not apply route as directed. Low  pressure knee high compression stockings Dx: leg swelling  1 each 0  . ELDERBERRY PO Take by mouth at bedtime. With  Vitamin c and zinc     . ferrous sulfate 325 (65 FE) MG tablet Take 325 mg by mouth in the morning.     . Flaxseed, Linseed, (FLAXSEED OIL PO) Take 1,400 mg by mouth at bedtime.     . insulin aspart (NOVOLOG) 100 UNIT/ML injection Inject 5-30 Units into the skin 3 (three) times daily before meals.     . lactulose (CHRONULAC) 10 GM/15ML solution Take 30 mLs (20 g total) by mouth daily. 236 mL 0  . loratadine (CLARITIN) 10 MG tablet Take 10 mg by mouth in the morning.     . Magnesium 250 MG TABS Take 250 mg by mouth daily.    . nadolol (CORGARD) 20 MG tablet Take 20 mg by mouth daily.    [name] omeprazole (PRILOSEC) 40 MG capsule Take 40 mg by mouth at bedtime.  2  . pravastatin (PRAVACHOL) 20 MG tablet TAKE 1 TABLET(20 MG) BY MOUTH AT BEDTIME 90 tablet 3  . spironolactone (ALDACTONE) 100 MG tablet Take 0.5 tablets (50 mg total) by mouth daily. 30 tablet 2  . torsemide (DEMADEX) 20 MG tablet Take 60 mg by mouth daily. Take 3 tablets (60 mg ) daily    . traMADol-acetaminophen (ULTRACET) 37.5-325 MG tablet Take 1 tablet by mouth every 4 (four) hours as needed for severe pain.    . TRESIBA FLEXTOUCH 200 UNIT/ML SOPN Inject 40-70 Units into the skin See admin instructions. Inject 70 units into the skin in the morning and 40 units at bedtime    . TURMERIC CURCUMIN PO Take 2,000 mg by mouth at bedtime.      No current facility-administered medications for this visit.    Allergies as of 10/16/2020  . (No Known Allergies)    Vitals: BP 117/77   Pulse 74   Ht 5' 10.5" (1.791 m)   Wt 250 lb (113.4 kg)   BMI 35.36 kg/m  Last Weight:  Wt Readings from Last 1 Encounters:  10/16/20 250 lb (113.4 kg)   MWU:XLKG mass index is 35.36 kg/m.     Last Height:   Ht Readings from Last 1 Encounters:  10/16/20 5' 10.5" (1.791 m)    Physical exam:  General: The patient is awake, alert and  appears not in acute distress. He is morbidly obese and still has ankle edema, reportedly lost 100 pounds in fluid on torsemide.  Head: Normocephalic, atraumatic.  Neck is supple, but short and thick-. Mallampati 2  ,  neck circumference:18.00"   .Neurologic exam : The patient is awake and alert, oriented to place and time.  Mood and affect are appropriate.  CN; intact; normal EOM, visual fil elds and pupillary response to light - direct and consensual.   Motor :  Denies tremor. No jaundice noted.   Assessment:  After physical and neurologic examination, review of laboratory studies. Personal review of imaging studies, reports of other /same  Imaging studies, results of polysomnography/ neurophysiology testing and pre-existing records as far as provided in visit.   His sleep habits are stable , he rises at 6 AM. Even during the pandemic.  He is a compliant CPAP user, lost power for 4 days which affected this years compliance. He felt the difference in sleep quality, wife noted jerking and snoring.     Plan:  Treatment plan and additional workup : 06-19-2019 I like for [name] to continue using his CPAP at the  current setting he is using also a dream wear mask and a dream station CPAP machine. He is due for a new machine , but his machine is also on recall- I will order a new HST and  see no reason to change to auto-settings on CPAP. I will then order a new CPAP autotitration device for January - February 2022.   He tolerated NUVIGIl very well, no palpitations, he also cut down from 4 to one cup of coffee, has achieved better blood glucose control I discussed the assessment and treatment plan with the patient. I refilled this script for PRN use.    I provided 23 minutes of -face-to-face time during this encounter.   Larey Seat, MD  RV in 4-5 month, after HST and new machine have been delivered-   for CPAP compliance and prn Nuvigil refills.  Good luck with the liver  transplant !    Asencion Partridge Dejaun Vidrio MD  10/16/2020   CC:  Dr. Domenic Polite, Dr.Mohamed,   PCP- [name], [address],  [place] [id number]

## 2020-10-16 NOTE — Patient Instructions (Signed)
Nonalcoholic Fatty Liver Disease Diet, Adult Nonalcoholic fatty liver disease is a condition that causes fat to build up in and around the liver. The disease makes it harder for the liver to work the way that it should. Following a healthy diet can help to keep nonalcoholic fatty liver disease under control. It can also help to prevent or improve conditions that are associated with the disease, such as heart disease, diabetes, high blood pressure, and abnormal cholesterol levels. Along with regular exercise, this diet:  Promotes weight loss.  Helps to control blood sugar levels.  Helps to improve the way that the body uses insulin. What are tips for following this plan? Reading food labels Always check food labels for:  The amount of saturated fat in a food. You should limit your intake of saturated fat. Saturated fat is found in foods that come from animals, including meat and dairy products such as butter, cheese, and whole milk.  The amount of fiber in a food. You should choose high-fiber foods such as fruits, vegetables, and whole grains. Try to get 25-30 grams (g) of fiber a day.  Cooking  When cooking, use heart-healthy oils that are high in monounsaturated fats. These include olive oil, canola oil, and avocado oil.  Limit frying or deep-frying foods. Cook foods using healthy methods such as baking, boiling, steaming, and grilling instead. Meal planning  You may want to keep track of how many calories you take in. Eating the right amount of calories will help you achieve a healthy weight. Meeting with a registered dietitian can help you get started.  Limit how often you eat takeout and fast food. These foods are usually very high in fat, salt, and sugar.  Use the glycemic index (GI) to plan your meals. The index tells you how quickly a food will raise your blood sugar. Choose low-GI foods (GI less than 55). These foods take a longer time to raise blood sugar. A registered dietitian  can help you identify foods lower on the GI scale. Lifestyle  You may want to follow a Mediterranean diet. This diet includes a lot of vegetables, lean meats or fish, whole grains, fruits, and healthy oils and fats. What foods can I eat?  Fruits Bananas. Apples. Oranges. Grapes. Papaya. Mango. Pomegranate. Kiwi. Grapefruit. Cherries. Vegetables Lettuce. Spinach. Peas. Beets. Cauliflower. Cabbage. Broccoli. Carrots. Tomatoes. Squash. Eggplant. Herbs. Peppers. Onions. Cucumbers. Brussels sprouts. Yams and sweet potatoes. Beans. Lentils. Grains Whole wheat or whole-grain foods, including breads, crackers, cereals, and pasta. Stone-ground whole wheat. Unsweetened oatmeal. Bulgur. Barley. Quinoa. Brown or wild rice. Corn or whole wheat flour tortillas. Meats and other proteins Lean meats. Poultry. Tofu. Seafood and shellfish. Dairy Low-fat or fat-free dairy products, such as yogurt, cottage cheese, or cheese. Beverages Water. Sugar-free drinks. Tea. Coffee. Low-fat or skim milk. Milk alternatives, such as soy or almond milk. Real fruit juice. Fats and oils Avocado. Canola or olive oil. Nuts and nut butters. Seeds. Seasonings and condiments Mustard. Relish. Low-fat, low-sugar ketchup and barbecue sauce. Low-fat or fat-free mayonnaise. Sweets and desserts Sugar-free sweets. The items listed above may not be a complete list of foods and beverages you can eat. Contact a dietitian for more information. What foods should I limit or avoid? Meats and other proteins Limit red meat to 1-2 times a week. Dairy Full-fat dairy. Fats and oils Palm oil and coconut oil. Fried foods. Other foods Processed foods. Foods that contain a lot of salt or sodium. Sweets and desserts Sweets that contain   sugar. Beverages Sweetened drinks, such as sweet tea, milkshakes, iced sweet drinks, and sodas. Alcohol. The items listed above may not be a complete list of foods and beverages you should avoid. Contact a  dietitian for more information. Where to find more information The National Institute of Diabetes and Digestive and Kidney Diseases: niddk.nih.gov Summary  Nonalcoholic fatty liver disease is a condition that causes fat to build up in and around the liver.  Following a healthy diet can help to keep nonalcoholic fatty liver disease under control. Your diet should be rich in fruits, vegetables, whole grains, and lean proteins.  Limit your intake of saturated fat. Saturated fat is found in foods that come from animals, including meat and dairy products such as butter, cheese, and whole milk.  This diet promotes weight loss, helps to control blood sugar levels, and helps to improve the way that the body uses insulin. This information is not intended to replace advice given to you by your health care provider. Make sure you discuss any questions you have with your health care provider. Document Revised: 03/23/2019 Document Reviewed: 12/21/2018 Elsevier Patient Education  2020 Elsevier Inc.  

## 2020-10-17 ENCOUNTER — Other Ambulatory Visit (HOSPITAL_COMMUNITY)
Admission: RE | Admit: 2020-10-17 | Discharge: 2020-10-17 | Disposition: A | Discharge status: Home / Self Care | Payer: Federal, State, Local not specified - PPO | Source: Ambulatory Visit | Attending: Nephrology | Admitting: Nephrology

## 2020-10-17 DIAGNOSIS — E871 Hypo-osmolality and hyponatremia: Secondary | ICD-10-CM | POA: Insufficient documentation

## 2020-10-17 DIAGNOSIS — N170 Acute kidney failure with tubular necrosis: Secondary | ICD-10-CM | POA: Insufficient documentation

## 2020-10-17 DIAGNOSIS — E1122 Type 2 diabetes mellitus with diabetic chronic kidney disease: Secondary | ICD-10-CM | POA: Diagnosis not present

## 2020-10-17 DIAGNOSIS — D696 Thrombocytopenia, unspecified: Secondary | ICD-10-CM | POA: Diagnosis not present

## 2020-10-17 DIAGNOSIS — N189 Chronic kidney disease, unspecified: Secondary | ICD-10-CM | POA: Insufficient documentation

## 2020-10-17 DIAGNOSIS — E211 Secondary hyperparathyroidism, not elsewhere classified: Secondary | ICD-10-CM | POA: Insufficient documentation

## 2020-10-17 DIAGNOSIS — K7460 Unspecified cirrhosis of liver: Secondary | ICD-10-CM | POA: Diagnosis not present

## 2020-10-17 DIAGNOSIS — Z01818 Encounter for other preprocedural examination: Secondary | ICD-10-CM | POA: Diagnosis not present

## 2020-10-17 DIAGNOSIS — F4323 Adjustment disorder with mixed anxiety and depressed mood: Secondary | ICD-10-CM | POA: Diagnosis not present

## 2020-10-17 LAB — PROTEIN / CREATININE RATIO, URINE
Creatinine, Urine: 25.03 mg/dL
Total Protein, Urine: <6 mg/dL

## 2020-10-17 LAB — RENAL FUNCTION PANEL
Albumin: 3.7 g/dL (ref 3.5–5.0)
Anion gap: 11 (ref 5–15)
BUN: 49 mg/dL — ABNORMAL HIGH (ref 6–20)
CO2: 27 mmol/L (ref 22–32)
Calcium: 9.4 mg/dL (ref 8.9–10.3)
Chloride: 97 mmol/L — ABNORMAL LOW (ref 98–111)
Creatinine, Ser: 2.17 mg/dL — ABNORMAL HIGH (ref 0.61–1.24)
GFR, Estimated: 34 mL/min — ABNORMAL LOW (ref >60)
Glucose, Bld: 147 mg/dL — ABNORMAL HIGH (ref 70–99)
Phosphorus: 3.7 mg/dL (ref 2.5–4.6)
Potassium: 3.4 mmol/L — ABNORMAL LOW (ref 3.5–5.1)
Sodium: 135 mmol/L (ref 135–145)

## 2020-10-17 LAB — CBC
HCT: 30.9 % — ABNORMAL LOW (ref 39.0–52.0)
Hemoglobin: 11.3 g/dL — ABNORMAL LOW (ref 13.0–17.0)
MCH: 36.2 pg — ABNORMAL HIGH (ref 26.0–34.0)
MCHC: 36.6 g/dL — ABNORMAL HIGH (ref 30.0–36.0)
MCV: 99 fL (ref 80.0–100.0)
Platelets: 91 10*3/uL — ABNORMAL LOW (ref 150–400)
RBC: 3.12 MIL/uL — ABNORMAL LOW (ref 4.22–5.81)
RDW: 15.5 % (ref 11.5–15.5)
WBC: 5.4 10*3/uL (ref 4.0–10.5)
nRBC: 0 % (ref 0.0–0.2)

## 2020-10-23 ENCOUNTER — Ambulatory Visit: Payer: Federal, State, Local not specified - PPO

## 2020-10-23 DIAGNOSIS — I5030 Unspecified diastolic (congestive) heart failure: Secondary | ICD-10-CM | POA: Diagnosis not present

## 2020-10-23 DIAGNOSIS — I10 Essential (primary) hypertension: Secondary | ICD-10-CM | POA: Diagnosis not present

## 2020-10-23 DIAGNOSIS — E1129 Type 2 diabetes mellitus with other diabetic kidney complication: Secondary | ICD-10-CM | POA: Diagnosis not present

## 2020-10-23 DIAGNOSIS — J683 Other acute and subacute respiratory conditions due to chemicals, gases, fumes and vapors: Secondary | ICD-10-CM | POA: Diagnosis not present

## 2020-10-27 ENCOUNTER — Other Ambulatory Visit (HOSPITAL_COMMUNITY): Payer: Self-pay | Admitting: Internal Medicine

## 2020-10-27 DIAGNOSIS — R051 Acute cough: Secondary | ICD-10-CM

## 2020-10-28 ENCOUNTER — Other Ambulatory Visit: Payer: Self-pay

## 2020-10-28 ENCOUNTER — Ambulatory Visit (HOSPITAL_COMMUNITY)
Admission: RE | Admit: 2020-10-28 | Discharge: 2020-10-28 | Disposition: A | Discharge status: Home / Self Care | Payer: Federal, State, Local not specified - PPO | Source: Ambulatory Visit | Attending: Internal Medicine | Admitting: Internal Medicine

## 2020-10-28 DIAGNOSIS — R051 Acute cough: Secondary | ICD-10-CM | POA: Insufficient documentation

## 2020-10-28 DIAGNOSIS — J9811 Atelectasis: Secondary | ICD-10-CM | POA: Diagnosis not present

## 2020-10-28 DIAGNOSIS — R059 Cough, unspecified: Secondary | ICD-10-CM | POA: Diagnosis not present

## 2020-10-28 DIAGNOSIS — J90 Pleural effusion, not elsewhere classified: Secondary | ICD-10-CM | POA: Diagnosis not present

## 2020-10-30 ENCOUNTER — Ambulatory Visit: Payer: Federal, State, Local not specified - PPO

## 2020-11-03 ENCOUNTER — Telehealth: Payer: Self-pay | Admitting: Cardiology

## 2020-11-03 ENCOUNTER — Other Ambulatory Visit (HOSPITAL_COMMUNITY)
Admission: RE | Admit: 2020-11-03 | Discharge: 2020-11-03 | Disposition: A | Discharge status: Home / Self Care | Payer: Federal, State, Local not specified - PPO | Source: Ambulatory Visit | Attending: Nephrology | Admitting: Nephrology

## 2020-11-03 ENCOUNTER — Other Ambulatory Visit: Payer: Self-pay

## 2020-11-03 DIAGNOSIS — N189 Chronic kidney disease, unspecified: Secondary | ICD-10-CM | POA: Insufficient documentation

## 2020-11-03 DIAGNOSIS — N170 Acute kidney failure with tubular necrosis: Secondary | ICD-10-CM | POA: Diagnosis not present

## 2020-11-03 DIAGNOSIS — E1122 Type 2 diabetes mellitus with diabetic chronic kidney disease: Secondary | ICD-10-CM | POA: Diagnosis not present

## 2020-11-03 DIAGNOSIS — D696 Thrombocytopenia, unspecified: Secondary | ICD-10-CM | POA: Insufficient documentation

## 2020-11-03 DIAGNOSIS — E211 Secondary hyperparathyroidism, not elsewhere classified: Secondary | ICD-10-CM | POA: Diagnosis not present

## 2020-11-03 LAB — CBC
HCT: 36.9 % — ABNORMAL LOW (ref 39.0–52.0)
Hemoglobin: 12.3 g/dL — ABNORMAL LOW (ref 13.0–17.0)
MCH: 35.5 pg — ABNORMAL HIGH (ref 26.0–34.0)
MCHC: 33.3 g/dL (ref 30.0–36.0)
MCV: 106.6 fL — ABNORMAL HIGH (ref 80.0–100.0)
Platelets: 72 10*3/uL — ABNORMAL LOW (ref 150–400)
RBC: 3.46 MIL/uL — ABNORMAL LOW (ref 4.22–5.81)
RDW: 16.7 % — ABNORMAL HIGH (ref 11.5–15.5)
WBC: 5 10*3/uL (ref 4.0–10.5)
nRBC: 0 % (ref 0.0–0.2)

## 2020-11-03 LAB — RENAL FUNCTION PANEL
Albumin: 3.4 g/dL — ABNORMAL LOW (ref 3.5–5.0)
Anion gap: 10 (ref 5–15)
BUN: 29 mg/dL — ABNORMAL HIGH (ref 6–20)
CO2: 31 mmol/L (ref 22–32)
Calcium: 8.9 mg/dL (ref 8.9–10.3)
Chloride: 96 mmol/L — ABNORMAL LOW (ref 98–111)
Creatinine, Ser: 1.68 mg/dL — ABNORMAL HIGH (ref 0.61–1.24)
GFR, Estimated: 46 mL/min — ABNORMAL LOW (ref >60)
Glucose, Bld: 164 mg/dL — ABNORMAL HIGH (ref 70–99)
Phosphorus: 3.9 mg/dL (ref 2.5–4.6)
Potassium: 3.4 mmol/L — ABNORMAL LOW (ref 3.5–5.1)
Sodium: 137 mmol/L (ref 135–145)

## 2020-11-03 LAB — PROTEIN / CREATININE RATIO, URINE
Creatinine, Urine: 24.22 mg/dL
Total Protein, Urine: <6 mg/dL

## 2020-11-03 NOTE — Telephone Encounter (Signed)
Patient states Dr. Domenic Polite asked that he calls the office we his leg starts swelling.  Right leg started swelling on Thursday. He is not in any pain, just tightness.

## 2020-11-03 NOTE — Telephone Encounter (Signed)
Thank you.  Let's have him double the dose for the next few days to see if this is helpful, would also get his leg looked at by PCP to make sure it does not look like cellulitis.

## 2020-11-03 NOTE — Telephone Encounter (Signed)
Please find out current dose of Demadex.  We might end up increasing this temporarily, but it sounds like he should have his leg looked at by PCP in case this is developing cellulitis.

## 2020-11-03 NOTE — Telephone Encounter (Signed)
Spoke with pt who states that his has right leg swelling. Onset was Thursday. Pt reports that leg is red and warm to touch. He denies chest pain and SOB at this time. He does report a weight gain of 3 pounds since Thursday. Please advise.

## 2020-11-03 NOTE — Telephone Encounter (Signed)
[name] will double his torsemide to 120 mg for 3 days. He will call me on Wednesday with update. He awaits call from PCP

## 2020-11-03 NOTE — Telephone Encounter (Signed)
Current dose of torsemide is 60 mg daily.  He will call his pcp for an apt.

## 2020-11-05 DIAGNOSIS — D696 Thrombocytopenia, unspecified: Secondary | ICD-10-CM | POA: Diagnosis not present

## 2020-11-05 DIAGNOSIS — N189 Chronic kidney disease, unspecified: Secondary | ICD-10-CM | POA: Diagnosis not present

## 2020-11-05 DIAGNOSIS — N170 Acute kidney failure with tubular necrosis: Secondary | ICD-10-CM | POA: Diagnosis not present

## 2020-11-05 DIAGNOSIS — E871 Hypo-osmolality and hyponatremia: Secondary | ICD-10-CM | POA: Diagnosis not present

## 2020-11-07 ENCOUNTER — Other Ambulatory Visit: Payer: Self-pay | Admitting: Gastroenterology

## 2020-11-07 DIAGNOSIS — K7460 Unspecified cirrhosis of liver: Secondary | ICD-10-CM

## 2020-11-07 DIAGNOSIS — I8500 Esophageal varices without bleeding: Secondary | ICD-10-CM

## 2020-11-10 DIAGNOSIS — R600 Localized edema: Secondary | ICD-10-CM | POA: Diagnosis not present

## 2020-11-10 DIAGNOSIS — Z6836 Body mass index (BMI) 36.0-36.9, adult: Secondary | ICD-10-CM | POA: Diagnosis not present

## 2020-11-10 DIAGNOSIS — N182 Chronic kidney disease, stage 2 (mild): Secondary | ICD-10-CM | POA: Diagnosis not present

## 2020-11-10 DIAGNOSIS — A46 Erysipelas: Secondary | ICD-10-CM | POA: Diagnosis not present

## 2020-11-12 ENCOUNTER — Ambulatory Visit (INDEPENDENT_AMBULATORY_CARE_PROVIDER_SITE_OTHER): Payer: Federal, State, Local not specified - PPO | Admitting: Neurology

## 2020-11-12 DIAGNOSIS — Z951 Presence of aortocoronary bypass graft: Secondary | ICD-10-CM

## 2020-11-12 DIAGNOSIS — G4733 Obstructive sleep apnea (adult) (pediatric): Secondary | ICD-10-CM

## 2020-11-12 DIAGNOSIS — Z01811 Encounter for preprocedural respiratory examination: Secondary | ICD-10-CM | POA: Diagnosis not present

## 2020-11-12 DIAGNOSIS — I5033 Acute on chronic diastolic (congestive) heart failure: Secondary | ICD-10-CM

## 2020-11-12 DIAGNOSIS — Z01810 Encounter for preprocedural cardiovascular examination: Secondary | ICD-10-CM | POA: Diagnosis not present

## 2020-11-12 DIAGNOSIS — E1165 Type 2 diabetes mellitus with hyperglycemia: Secondary | ICD-10-CM

## 2020-11-12 DIAGNOSIS — I214 Non-ST elevation (NSTEMI) myocardial infarction: Secondary | ICD-10-CM

## 2020-11-12 DIAGNOSIS — Z01818 Encounter for other preprocedural examination: Secondary | ICD-10-CM | POA: Diagnosis not present

## 2020-11-12 DIAGNOSIS — K7460 Unspecified cirrhosis of liver: Secondary | ICD-10-CM

## 2020-11-12 DIAGNOSIS — M810 Age-related osteoporosis without current pathological fracture: Secondary | ICD-10-CM | POA: Diagnosis not present

## 2020-11-12 DIAGNOSIS — R188 Other ascites: Secondary | ICD-10-CM

## 2020-11-13 ENCOUNTER — Other Ambulatory Visit: Payer: Self-pay | Admitting: Neurology

## 2020-11-13 ENCOUNTER — Ambulatory Visit: Payer: Federal, State, Local not specified - PPO | Attending: Internal Medicine

## 2020-11-13 ENCOUNTER — Telehealth: Payer: Self-pay | Admitting: Neurology

## 2020-11-13 DIAGNOSIS — Z23 Encounter for immunization: Secondary | ICD-10-CM

## 2020-11-13 MED ORDER — ARMODAFINIL 200 MG PO TABS
200.0000 mg | ORAL_TABLET | Freq: Every day | ORAL | 2 refills | Status: DC | PRN
Start: 2020-11-13 — End: 2020-12-22

## 2020-11-13 NOTE — Progress Notes (Signed)
   Covid-19 Vaccination Clinic  Name:  [name]    MRN: [id number] DOB: 1961-11-18  11/13/2020  [name] was observed post Covid-19 immunization for 15 minutes without incident. He was provided with Vaccine Information Sheet and instruction to access the V-Safe system.   [name] was instructed to call 911 with any severe reactions post vaccine: [name] Difficulty breathing  . Swelling of face and throat  . A fast heartbeat  . A bad rash all over body  . Dizziness and weakness   Immunizations Administered    Name Date Dose VIS Date Route   Pfizer COVID-19 Vaccine 11/13/2020  1:20 PM 0.3 mL 10/01/2020 Intramuscular   Manufacturer: [place]   Lot: [id number]   [place]: [address]-6

## 2020-11-13 NOTE — Telephone Encounter (Signed)
I have routed this request to Dr Dohmeier for review. The pt is due for the medication and [place] registry was verified.  

## 2020-11-13 NOTE — Telephone Encounter (Signed)
Pt called stating that he was to start a new medication in the beginning of November but the pharmacy informed him that they never received the order. Please send Armodafinil 200 MG TABS to the [facility] on [address]

## 2020-11-14 ENCOUNTER — Telehealth: Payer: Self-pay | Admitting: Neurology

## 2020-11-14 NOTE — Telephone Encounter (Signed)
PA completed through cover my meds/ [employer].  ERD:[id number] Approved immediately 10/15/2020 through 11/14/2021

## 2020-11-17 ENCOUNTER — Ambulatory Visit: Payer: Federal, State, Local not specified - PPO | Admitting: Cardiology

## 2020-11-17 NOTE — Progress Notes (Deleted)
Cardiology Office Note  Date: 11/17/2020   ID: Rachit, Grim [birth year], MRN [id number]  PCP:  [name], MD  Cardiologist:  Rozann Lesches, MD Electrophysiologist:  None   No chief complaint on file.   History of Present Illness: [name] is a 59 y.o. male last seen in October.  Recent lab work is noted below.  Past Medical History:  Diagnosis Date  . Anemia   . Arthritis   . CAD (coronary artery disease)    Multivessel disease status post CABG 08/2015  . Cirrhosis ([place])   . CKD (chronic kidney disease) stage 3, GFR 30-59 ml/min ([facility])   . Diastolic CHF ([place])   . Essential hypertension   . Hyperlipidemia   . OSA on CPAP   . Polyclonal gammopathy   . Thrombocytopenia ([place]) 2016  . Type 2 diabetes mellitus ([place])     Past Surgical History:  Procedure Laterality Date  . APPENDECTOMY    . Biceps tendon surgery Right   . BIOPSY  11/22/2019   Procedure: BIOPSY;  Surgeon: Daneil Dolin, MD;  [facility];  Service: Endoscopy;;  . CARDIAC CATHETERIZATION N/A 08/25/2015   Procedure: Left Heart Cath and Coronary Angiography;  Surgeon: Belva Crome, MD;  Location: [facility];  Service: Cardiovascular;  Laterality: N/A;  . COLONOSCOPY WITH PROPOFOL N/A 11/22/2019   Procedure: COLONOSCOPY WITH PROPOFOL;  Surgeon: Daneil Dolin, MD; Four 4-5 mm polyps, findings suggestive of portal colopathy, congested appearing colonic mucosa diffusely, rectal varices, and adequate right colon prep.  Pathology with tubular adenomas and hyperplastic polyp.  Right colon biopsy with focal active colitis.  Recommendations to repeat colonoscopy in 3 months due to poor prep.  . COLONOSCOPY WITH PROPOFOL N/A 03/17/2020   Procedure: COLONOSCOPY WITH PROPOFOL;  Surgeon: Daneil Dolin, MD;  [facility];  Service: Endoscopy;  Laterality: N/A;  8:45am - pt does not need covid test, was + 2/4 <90 days  . CORONARY ARTERY BYPASS GRAFT N/A 08/29/2015    Procedure: CORONARY ARTERY BYPASS GRAFTING (CABG);  Surgeon: [name], MD;  Location: [facility];  Service: Open Heart Surgery;  Laterality: N/A;  . ESOPHAGOGASTRODUODENOSCOPY (EGD) WITH PROPOFOL N/A 11/22/2019   Procedure: ESOPHAGOGASTRODUODENOSCOPY (EGD) WITH PROPOFOL;  Surgeon: Daneil Dolin, MD; 4 columns of grade 2-3 esophageal varices, portal gastropathy, gastric polyp/abnormal gastric mucosa s/p biopsy.  Pathology with hyperplastic polyp, mild chronic gastritis, negative H. pylori.  [name] KNEE ARTHROSCOPY Left   . TEE WITHOUT CARDIOVERSION N/A 08/29/2015   Procedure: TRANSESOPHAGEAL ECHOCARDIOGRAM (TEE);  Surgeon: [name], MD;  Location: [facility];  Service: Open Heart Surgery;  Laterality: N/A;  . TOTAL KNEE ARTHROPLASTY Left 03/09/2017   Procedure: TOTAL KNEE ARTHROPLASTY;  Surgeon: Carole Civil, MD;  Location: [facility];  Service: Orthopedics;  Laterality: Left;    Current Outpatient Medications  Medication Sig Dispense Refill  . albuterol (VENTOLIN HFA) 108 (90 Base) MCG/ACT inhaler Inhale 2 puffs into the lungs every 6 (six) hours as needed for wheezing or shortness of breath. 18 g 0  . Armodafinil 200 MG TABS Take 200 mg by mouth daily as needed (for sleepiness). 30 tablet 2  . aspirin EC 81 MG tablet Take 81 mg by mouth at bedtime.     . Cholecalciferol (DIALYVITE VITAMIN D 5000) 125 MCG (5000 UT) capsule Take 5,000 Units by mouth in the morning.     [name] Bandages & Supports (MEDICAL COMPRESSION STOCKINGS)  MISC 1 each by Does not apply route as directed. Low pressure knee high compression stockings Dx: leg swelling 1 each 0  . ELDERBERRY PO Take by mouth at bedtime. With  Vitamin c and zinc     . ferrous sulfate 325 (65 FE) MG tablet Take 325 mg by mouth in the morning.     . Flaxseed, Linseed, (FLAXSEED OIL PO) Take 1,400 mg by mouth at bedtime.     . insulin aspart (NOVOLOG) 100 UNIT/ML injection Inject 5-30 Units into the skin 3 (three) times daily before  meals.     . lactulose (CHRONULAC) 10 GM/15ML solution Take 30 mLs (20 g total) by mouth daily. 236 mL 0  . loratadine (CLARITIN) 10 MG tablet Take 10 mg by mouth in the morning.     . Magnesium 250 MG TABS Take 250 mg by mouth daily.    . nadolol (CORGARD) 20 MG tablet Take 20 mg by mouth daily.    . nadolol (CORGARD) 40 MG tablet TAKE 1 TABLET(40 MG) BY MOUTH DAILY 30 tablet 3  . omeprazole (PRILOSEC) 40 MG capsule Take 40 mg by mouth at bedtime.  2  . pravastatin (PRAVACHOL) 20 MG tablet TAKE 1 TABLET(20 MG) BY MOUTH AT BEDTIME 90 tablet 3  . spironolactone (ALDACTONE) 100 MG tablet Take 0.5 tablets (50 mg total) by mouth daily. 30 tablet 2  . torsemide (DEMADEX) 20 MG tablet Take 60 mg by mouth daily. Take 3 tablets (60 mg ) daily    . TRESIBA FLEXTOUCH 200 UNIT/ML SOPN Inject 40-70 Units into the skin See admin instructions. Inject 70 units into the skin in the morning and 40 units at bedtime    . TURMERIC CURCUMIN PO Take 2,000 mg by mouth at bedtime.      No current facility-administered medications for this visit.   Allergies:  Patient has no known allergies.   Social History: The patient  reports that he has never smoked. He has never used smokeless tobacco. He reports that he does not drink alcohol and does not use drugs.   Family History: The patient's family history includes ALS in his mother; Aneurysm in his maternal grandmother; Arthritis in an other family member; CAD in his brother and father; Cancer in his maternal grandfather and another family member; Diabetes in an other family member; Diabetes Mellitus II in his brother, brother, father, maternal grandmother, and sister; Hodgkin's lymphoma in his father; Liver cancer in his father.   ROS:  Please see the history of present illness. Otherwise, complete review of systems is positive for {NONE DEFAULTED:18576::"none"}.  All other systems are reviewed and negative.   Physical Exam: VS:  There were no vitals taken for this  visit., BMI There is no height or weight on file to calculate BMI.  Wt Readings from Last 3 Encounters:  10/16/20 250 lb (113.4 kg)  10/09/20 250 lb 6.4 oz (113.6 kg)  09/19/20 246 lb (111.6 kg)    General: Patient appears comfortable at rest. HEENT: Conjunctiva and lids normal, oropharynx clear with moist mucosa. Neck: Supple, no elevated JVP or carotid bruits, no thyromegaly. Lungs: Clear to auscultation, nonlabored breathing at rest. Cardiac: Regular rate and rhythm, no S3 or significant systolic murmur, no pericardial rub. Abdomen: Soft, nontender, no hepatomegaly, bowel sounds present, no guarding or rebound. Extremities: No pitting edema, distal pulses 2+. Skin: Warm and dry. Musculoskeletal: No kyphosis. Neuropsychiatric: Alert and oriented x3, affect grossly appropriate.  ECG:  An ECG dated 06/20/2020 was personally  reviewed today and demonstrated:  Sinus rhythm with low voltage and nonspecific T wave changes.  Recent Labwork: 06/20/2020: B Natriuretic Peptide 601.0 06/30/2020: Magnesium 1.9 07/10/2020: ALT 15; AST 29 11/03/2020: BUN 29; Creatinine, Ser 1.68; Hemoglobin 12.3; Platelets 72; Potassium 3.4; Sodium 137     Component Value Date/Time   CHOL 120 05/25/2019 1001   TRIG 136 05/25/2019 1001   HDL 38 (L) 05/25/2019 1001   CHOLHDL 3.2 05/25/2019 1001   VLDL 27 05/25/2019 1001   LDLCALC 55 05/25/2019 1001    Other Studies Reviewed Today:  Echocardiogram 05/26/2020: 1. Left ventricular ejection fraction, by estimation, is 60 to 65%. The  left ventricle has normal function. The left ventricle has no regional  wall motion abnormalities. The left ventricular internal cavity size was  mildly dilated. There is mild left  ventricular hypertrophy. Left ventricular diastolic parameters are  indeterminate.  2. Right ventricular systolic function is normal. The right ventricular  size is normal. There is normal pulmonary artery systolic pressure.  3. The mitral valve is  abnormal. Mild mitral valve regurgitation.  4. AV is thickened, calcifieid Difficult to see well. Peak and mean  gradients through the valve are 31 and 18 mm Hg respectively consistent  with mild AS. Compared to echo report from 2019, gradients are increased.  . The aortic valve is abnormal. Aortic  valve regurgitation is not visualized. Mild aortic valve stenosis.  5. The inferior vena cava is dilated in size with <50% respiratory  variability, suggesting right atrial pressure of 15 mmHg.   Assessment and Plan:    Medication Adjustments/Labs and Tests Ordered: Current medicines are reviewed at length with the patient today.  Concerns regarding medicines are outlined above.   Tests Ordered: No orders of the defined types were placed in this encounter.   Medication Changes: No orders of the defined types were placed in this encounter.   Disposition:  Follow up {follow up:15908}  Signed, Satira Sark, MD, [facility] 11/17/2020 8:06 AM    [place] [facility] [place], [place], [place] [address] Phone: [phone]; Fax: [phone]

## 2020-11-17 NOTE — Progress Notes (Signed)
Cardiology Office Note  Date: 11/18/2020   ID: [name], Christopher Sep 11, 1961, MRN [id number]  PCP:  [name], MD  Cardiologist:  Rozann Lesches, MD Electrophysiologist:  None   Chief Complaint: Follow-up swelling in legs and ankles for the last 2 weeks   History of Present Illness: [name] is a 59 y.o. male with a history of chronic diastolic heart failure, CAD, cirrhosis of liver/ascites, stage III CKD, HTN, HLD, DM 2, thrombocytopenia, OSA on CPAP.  Last encounter with Dr. Domenic Polite 09/19/2020.  He had seen Dr. Theador Hawthorne in October.  His Demadex was changed to 60 mg p.o. twice daily on M/W/F and once daily for the rest of the week.  He has chronic renal failure with stage III CKD baseline.  His weight had not increased further.  History of liver cirrhosis with portal hypertension-ascites.  Following at hepatology center at [facility] currently on transplant list.  Creatinine was 2.78.  He was following with Dr. Theador Hawthorne later in the month.  Multivessel CAD status post CABG 2016.  EF 60 to 65%.  No active angina.  Continuing aspirin, beta-blocker and Pravachol.    Past Medical History:  Diagnosis Date  . Anemia   . Arthritis   . CAD (coronary artery disease)    Multivessel disease status post CABG 08/2015  . Cirrhosis ([place])   . CKD (chronic kidney disease) stage 3, GFR 30-59 ml/min (HCC)   . Diastolic CHF ([place])   . Essential hypertension   . Hyperlipidemia   . OSA on CPAP   . Polyclonal gammopathy   . Thrombocytopenia ([place]) 2016  . Type 2 diabetes mellitus ([place])     Past Surgical History:  Procedure Laterality Date  . APPENDECTOMY    . Biceps tendon surgery Right   . BIOPSY  11/22/2019   Procedure: BIOPSY;  Surgeon: Daneil Dolin, MD;  [facility];  Service: Endoscopy;;  . CARDIAC CATHETERIZATION N/A 08/25/2015   Procedure: Left Heart Cath and Coronary Angiography;  Surgeon: Belva Crome, MD;  Location: [place] [facility];  Service: Cardiovascular;  Laterality: N/A;  . COLONOSCOPY WITH PROPOFOL N/A 11/22/2019   Procedure: COLONOSCOPY WITH PROPOFOL;  Surgeon: Daneil Dolin, MD; Four 4-5 mm polyps, findings suggestive of portal colopathy, congested appearing colonic mucosa diffusely, rectal varices, and adequate right colon prep.  Pathology with tubular adenomas and hyperplastic polyp.  Right colon biopsy with focal active colitis.  Recommendations to repeat colonoscopy in 3 months due to poor prep.  . COLONOSCOPY WITH PROPOFOL N/A 03/17/2020   Procedure: COLONOSCOPY WITH PROPOFOL;  Surgeon: Daneil Dolin, MD;  [facility];  Service: Endoscopy;  Laterality: N/A;  8:45am - pt does not need covid test, was + 2/4 <90 days  . CORONARY ARTERY BYPASS GRAFT N/A 08/29/2015   Procedure: CORONARY ARTERY BYPASS GRAFTING (CABG);  Surgeon: [name], MD;  Location: [place];  Service: Open Heart Surgery;  Laterality: N/A;  . ESOPHAGOGASTRODUODENOSCOPY (EGD) WITH PROPOFOL N/A 11/22/2019   Procedure: ESOPHAGOGASTRODUODENOSCOPY (EGD) WITH PROPOFOL;  Surgeon: Daneil Dolin, MD; 4 columns of grade 2-3 esophageal varices, portal gastropathy, gastric polyp/abnormal gastric mucosa s/p biopsy.  Pathology with hyperplastic polyp, mild chronic gastritis, negative H. pylori.  [name] KNEE ARTHROSCOPY Left   . TEE WITHOUT CARDIOVERSION N/A 08/29/2015   Procedure: TRANSESOPHAGEAL ECHOCARDIOGRAM (TEE);  Surgeon: [name], MD;  Location: [place];  Service: Open Heart Surgery;  Laterality: N/A;  . TOTAL  KNEE ARTHROPLASTY Left 03/09/2017   Procedure: TOTAL KNEE ARTHROPLASTY;  Surgeon: Carole Civil, MD;  Location: [facility];  Service: Orthopedics;  Laterality: Left;    Current Outpatient Medications  Medication Sig Dispense Refill  . albuterol (VENTOLIN HFA) 108 (90 Base) MCG/ACT inhaler Inhale 2 puffs into the lungs every 6 (six) hours as needed for wheezing or shortness of breath. 18 g 0  . Armodafinil 200 MG TABS  Take 200 mg by mouth daily as needed (for sleepiness). 30 tablet 2  . aspirin EC 81 MG tablet Take 81 mg by mouth at bedtime.     . Cholecalciferol (DIALYVITE VITAMIN D 5000) 125 MCG (5000 UT) capsule Take 5,000 Units by mouth in the morning.     [name] Bandages & Supports (MEDICAL COMPRESSION STOCKINGS) MISC 1 each by Does not apply route as directed. Low pressure knee high compression stockings Dx: leg swelling 1 each 0  . ELDERBERRY PO Take by mouth at bedtime. With  Vitamin c and zinc     . ferrous sulfate 325 (65 FE) MG tablet Take 325 mg by mouth in the morning.     . Flaxseed, Linseed, (FLAXSEED OIL PO) Take 1,400 mg by mouth at bedtime.     . insulin aspart (NOVOLOG) 100 UNIT/ML injection Inject 5-30 Units into the skin 3 (three) times daily before meals.     . lactulose (CHRONULAC) 10 GM/15ML solution Take 30 mLs (20 g total) by mouth daily. 236 mL 0  . loratadine (CLARITIN) 10 MG tablet Take 10 mg by mouth in the morning.     . Magnesium 250 MG TABS Take 250 mg by mouth daily.    . nadolol (CORGARD) 20 MG tablet Take 20 mg by mouth daily.    . nadolol (CORGARD) 40 MG tablet TAKE 1 TABLET(40 MG) BY MOUTH DAILY 30 tablet 3  . omeprazole (PRILOSEC) 40 MG capsule Take 40 mg by mouth at bedtime.  2  . pravastatin (PRAVACHOL) 20 MG tablet TAKE 1 TABLET(20 MG) BY MOUTH AT BEDTIME 90 tablet 3  . spironolactone (ALDACTONE) 100 MG tablet Take 0.5 tablets (50 mg total) by mouth daily. 30 tablet 2  . torsemide (DEMADEX) 20 MG tablet Take 60 mg by mouth 2 (two) times daily.    . TRESIBA FLEXTOUCH 200 UNIT/ML SOPN Inject 40-70 Units into the skin See admin instructions. Inject 70 units into the skin in the morning and 40 units at bedtime    . TURMERIC CURCUMIN PO Take 2,000 mg by mouth at bedtime.      No current facility-administered medications for this visit.   Allergies:  Patient has no known allergies.   Social History: The patient  reports that he has never smoked. He has never used  smokeless tobacco. He reports that he does not drink alcohol and does not use drugs.   Family History: The patient's family history includes ALS in his mother; Aneurysm in his maternal grandmother; Arthritis in an other family member; CAD in his brother and father; Cancer in his maternal grandfather and another family member; Diabetes in an other family member; Diabetes Mellitus II in his brother, brother, father, maternal grandmother, and sister; Hodgkin's lymphoma in his father; Liver cancer in his father.   ROS:  Please see the history of present illness. Otherwise, complete review of systems is positive for none.  All other systems are reviewed and negative.   Physical Exam: VS:  BP 108/64   Pulse 79   Ht 5'  10.5" (1.791 m)   Wt 254 lb (115.2 kg)   SpO2 99%   BMI 35.93 kg/m , BMI Body mass index is 35.93 kg/m.  Wt Readings from Last 3 Encounters:  11/18/20 254 lb (115.2 kg)  10/16/20 250 lb (113.4 kg)  10/09/20 250 lb 6.4 oz (113.6 kg)    General: Patient appears comfortable at rest. Neck: Supple, no elevated JVP or carotid bruits, no thyromegaly. Lungs: Clear to auscultation, nonlabored breathing at rest. Cardiac: Regular rate and rhythm, no S3 or significant systolic murmur, no pericardial rub. Extremities: No pitting edema, distal pulses 2+. Skin: Warm and dry. Musculoskeletal: No kyphosis. Neuropsychiatric: Alert and oriented x3, affect grossly appropriate.  ECG:  EKG June 20, 2020: Sinus rhythm rate of 69.  Recent Labwork: 06/20/2020: B Natriuretic Peptide 601.0 06/30/2020: Magnesium 1.9 07/10/2020: ALT 15; AST 29 11/03/2020: BUN 29; Creatinine, Ser 1.68; Hemoglobin 12.3; Platelets 72; Potassium 3.4; Sodium 137     Component Value Date/Time   CHOL 120 05/25/2019 1001   TRIG 136 05/25/2019 1001   HDL 38 (L) 05/25/2019 1001   CHOLHDL 3.2 05/25/2019 1001   VLDL 27 05/25/2019 1001   LDLCALC 55 05/25/2019 1001    Other Studies Reviewed Today:  Echocardiogram  05/26/2020: 1. Left ventricular ejection fraction, by estimation, is 60 to 65%. The  left ventricle has normal function. The left ventricle has no regional  wall motion abnormalities. The left ventricular internal cavity size was  mildly dilated. There is mild left  ventricular hypertrophy. Left ventricular diastolic parameters are  indeterminate.  2. Right ventricular systolic function is normal. The right ventricular  size is normal. There is normal pulmonary artery systolic pressure.  3. The mitral valve is abnormal. Mild mitral valve regurgitation.  4. AV is thickened, calcifieid Difficult to see well. Peak and mean  gradients through the valve are 31 and 18 mm Hg respectively consistent  with mild AS. Compared to echo report from 2019, gradients are increased.  . The aortic valve is abnormal. Aortic  valve regurgitation is not visualized. Mild aortic valve stenosis.  5. The inferior vena cava is dilated in size with <50% respiratory  variability, suggesting right atrial pressure of 15 mmHg.   Carotid Dopplers 05/14/2020: Summary:  Right Carotid: Velocities in the right ICA are consistent with a 1-39%  stenosis.   Left Carotid: Velocities in the left ICA are consistent with a 40-59%  stenosis.   Vertebrals: Bilateral vertebral arteries demonstrate antegrade flow.  Subclavians: Normal flow hemodynamics were seen in bilateral subclavian        arteries.   Assessment and Plan:  1. Chronic diastolic heart failure (HCC)   2. Stage 3b chronic kidney disease ([place])   3. CAD in native artery   4. Cirrhosis of liver with ascites, unspecified hepatic cirrhosis type ([place])   5. Thrombocytopenia ([place])    1. Chronic diastolic heart failure ([place]) He states he has gained some weight and he has some increased lower extremity edema.  On exam he has some very mild edema.  Since 10/16/2020 he has gained approximately 4 pounds.  He denies any shortness of breath or dyspnea on exertion.   Increase torsemide to 60 mg twice a day for 1 week then revert back to 60 mg daily on Mondays, Wednesdays, and Fridays: Then back to 60 mg twice daily on Tuesday, Thursday, Saturday, Sunday as as he was prior.  2. Stage 3b chronic kidney disease ([place]) Recent creatinine 1.68, GFR 46 on 11/03/2020.  This  was an improvement from previous creatinine of 2.17 and GFR of 34 on 10/17/2020.  He is following with Dr. Theador Hawthorne nephrology.  3. CAD in native artery Denies any anginal or exertional symptoms.  4. Cirrhosis of liver with ascites, unspecified hepatic cirrhosis type [facility]) Patient is seeing [facility] and is on the transplant list for liver transplant.  5. Thrombocytopenia (HCC) Recent CBC showed hemoglobin of 12.3, hematocrit 36.9.  Platelets 72.  He has an upcoming appointment with Dr. Delton Coombes secondary to thrombocytopenia.  Medication Adjustments/Labs and Tests Ordered: Current medicines are reviewed at length with the patient today.  Concerns regarding medicines are outlined above.   Disposition: Keep upcoming appointment with Dr. Domenic Polite January 01, 2021  Signed, Levell July, NP 11/18/2020 2:43 PM    [facility] [address], [place], [place] [address] Phone: [phone]; Fax: [phone]

## 2020-11-18 ENCOUNTER — Ambulatory Visit (INDEPENDENT_AMBULATORY_CARE_PROVIDER_SITE_OTHER): Payer: Federal, State, Local not specified - PPO | Admitting: Family Medicine

## 2020-11-18 ENCOUNTER — Encounter: Payer: Self-pay | Admitting: Family Medicine

## 2020-11-18 ENCOUNTER — Other Ambulatory Visit (HOSPITAL_COMMUNITY): Payer: Federal, State, Local not specified - PPO

## 2020-11-18 ENCOUNTER — Other Ambulatory Visit: Payer: Self-pay

## 2020-11-18 VITALS — BP 108/64 | HR 79 | Ht 70.5 in | Wt 254.0 lb

## 2020-11-18 DIAGNOSIS — I6523 Occlusion and stenosis of bilateral carotid arteries: Secondary | ICD-10-CM

## 2020-11-18 DIAGNOSIS — N1832 Chronic kidney disease, stage 3b: Secondary | ICD-10-CM

## 2020-11-18 DIAGNOSIS — I5032 Chronic diastolic (congestive) heart failure: Secondary | ICD-10-CM | POA: Diagnosis not present

## 2020-11-18 DIAGNOSIS — K7460 Unspecified cirrhosis of liver: Secondary | ICD-10-CM | POA: Diagnosis not present

## 2020-11-18 DIAGNOSIS — I2510 Atherosclerotic heart disease of native coronary artery without angina pectoris: Secondary | ICD-10-CM

## 2020-11-18 DIAGNOSIS — R188 Other ascites: Secondary | ICD-10-CM

## 2020-11-18 DIAGNOSIS — D696 Thrombocytopenia, unspecified: Secondary | ICD-10-CM

## 2020-11-18 NOTE — Patient Instructions (Addendum)
Medication Instructions:   Your physician has recommended you make the following change in your medication:   Take torsemide 60 mg twice daily for one week, then resume previous dose   Continue other medications the same  Labwork:  None  Testing/Procedures:  None  Follow-Up:  Your physician recommends that you schedule a follow-up appointment in: in January as planned  Any Other Special Instructions Will Be Listed Below (If Applicable).  If you need a refill on your cardiac medications before your next appointment, please call your pharmacy.

## 2020-11-19 DIAGNOSIS — Z794 Long term (current) use of insulin: Secondary | ICD-10-CM | POA: Diagnosis not present

## 2020-11-19 DIAGNOSIS — K7460 Unspecified cirrhosis of liver: Secondary | ICD-10-CM | POA: Diagnosis not present

## 2020-11-19 DIAGNOSIS — E1122 Type 2 diabetes mellitus with diabetic chronic kidney disease: Secondary | ICD-10-CM | POA: Diagnosis not present

## 2020-11-19 DIAGNOSIS — N189 Chronic kidney disease, unspecified: Secondary | ICD-10-CM | POA: Diagnosis not present

## 2020-11-19 DIAGNOSIS — E1165 Type 2 diabetes mellitus with hyperglycemia: Secondary | ICD-10-CM | POA: Diagnosis not present

## 2020-11-19 NOTE — Progress Notes (Signed)
   [facility]  HOME SLEEP TEST (Watch PAT)  STUDY DATE: data load on 11/19/20  DOB: 1961-10-31  MRN: [id number]  ORDERING CLINICIAN: Larey Seat, MD   REFERRING CLINICIAN: [name], MD   CLINICAL INFORMATION/HISTORY: 11.4.2021; [name] "[name]" is a meanwhile  59 year old Caucasian gentleman who has followed in our practice for over 5 years with a diagnosis of obstructive sleep apnea snoring and is being treated on CPAP.  His risk factors have always included morbid obesity. He states that he was recently told that he will need a liver transplant in response to a NASH nonalcoholic steatosis of the liver. Liver is cirrhotic. Had pancreatic failure- He states that his pancreas no longer produces either digestive enzymes nor insulin. His diabetes is better controlled - he has a no needle check device. CPAP: His C-Flex machine was interrogated today , it is a DreamStation auto CPAP- this device had been recalled.  He has used it compliantly for 97% of the last 30 days, his usual average daily user time is 4 hours 42 minutes and he achieves a reduced AHI of only 0.3/h . This CPAP is set at 9 cm water.  There are sometimes air leaks but these seem to be related to movement going to the bathroom etc. The humidification setting has been medium, he has a C-Flex function of 1 cm water, the equivalent of an expiratory pressure relief.  He is on 10 cm ramp starting at 4 cmH2O pressure and reaching his max pressure of 9 cm. He never used an ozone cleaner- his machine doesn't make noise and he has not noted particles in his water-chamber.  Epworth sleepiness score: 4/24.  BMI: 35.36 kg/m  FINDINGS:   Total Record Time (hours, min): 7 h 15 min  Total Sleep Time (hours, min):  5 h 40 min   Percent REM (%):    20.26 %   Calculated pAHI (per hour):  5.9       REM pAHI:    19.1     NREM pAHI: 2.5 Supine AHI: N/A   Oxygen Saturation (%)  Mean: 93  Minimum oxygen saturation (%):        87   O2 Saturation Range (%): 87-98  O2Saturation (minutes) <=88%: 2.4 min   Pulse Mean (bpm):    78  Pulse Range (68-94)   IMPRESSION: Mild OSA (obstructive sleep apnea),  OSA is much reduced to an AHI of only 5.9/h overall and REM AHI of 19.1/h. There was no prolonged hypoxia and no tachy-bradycardia noted.   RECOMMENDATION: The patient has struggled with Excessive Daytime Sleepiness in the past, he can continue to use CPAP if his degree of sleepiness benefits from this or has the option to discontinue the therapy. Apnea is mild enough to make PAP therapy optional.       INTERPRETING PHYSICIAN:  Larey Seat, MD

## 2020-11-24 ENCOUNTER — Ambulatory Visit (HOSPITAL_COMMUNITY): Payer: Federal, State, Local not specified - PPO | Admitting: Hematology

## 2020-11-25 ENCOUNTER — Other Ambulatory Visit (HOSPITAL_COMMUNITY)
Admission: RE | Admit: 2020-11-25 | Discharge: 2020-11-25 | Disposition: A | Discharge status: Home / Self Care | Payer: Federal, State, Local not specified - PPO | Source: Ambulatory Visit | Attending: Nephrology | Admitting: Nephrology

## 2020-11-25 ENCOUNTER — Other Ambulatory Visit: Payer: Self-pay

## 2020-11-25 DIAGNOSIS — E871 Hypo-osmolality and hyponatremia: Secondary | ICD-10-CM | POA: Insufficient documentation

## 2020-11-25 DIAGNOSIS — E876 Hypokalemia: Secondary | ICD-10-CM | POA: Insufficient documentation

## 2020-11-25 DIAGNOSIS — E1122 Type 2 diabetes mellitus with diabetic chronic kidney disease: Secondary | ICD-10-CM | POA: Insufficient documentation

## 2020-11-25 DIAGNOSIS — D696 Thrombocytopenia, unspecified: Secondary | ICD-10-CM | POA: Insufficient documentation

## 2020-11-25 DIAGNOSIS — N170 Acute kidney failure with tubular necrosis: Secondary | ICD-10-CM | POA: Diagnosis not present

## 2020-11-25 DIAGNOSIS — N189 Chronic kidney disease, unspecified: Secondary | ICD-10-CM | POA: Insufficient documentation

## 2020-11-25 DIAGNOSIS — J069 Acute upper respiratory infection, unspecified: Secondary | ICD-10-CM | POA: Diagnosis not present

## 2020-11-25 LAB — POTASSIUM: Potassium: 4.2 mmol/L (ref 3.5–5.1)

## 2020-11-25 NOTE — Progress Notes (Signed)
IMPRESSION: Mild OSA (obstructive sleep apnea),  OSA is much reduced to an AHI of only 5.9/h overall and REM AHI of 19.1/h. There was no prolonged hypoxia and no tachy-bradycardia noted.   RECOMMENDATION: The patient has struggled with Excessive Daytime Sleepiness in the past, he can continue to use CPAP if his degree of sleepiness benefits from this or has the option to discontinue the therapy. Apnea is mild enough to make PAP therapy optional.     If he prefers to continue auto-CPAP, I will order a new machine with a setting of 9 cm water, mask of his choice and heated humidification.

## 2020-11-25 NOTE — Procedures (Signed)
[facility]  HOME SLEEP TEST (Watch PAT)  STUDY DATE: data load on 11/19/20  DOB: 08-31-1961  MRN: [id number]  ORDERING CLINICIAN: Larey Seat, MD   REFERRING CLINICIAN: [name], MD   CLINICAL INFORMATION/HISTORY: 11.4.2021; [name] "[name]" is a meanwhile  59 year old Caucasian gentleman who has followed in our practice for over 5 years with a diagnosis of obstructive sleep apnea snoring and is being treated on CPAP.  His risk factors have always included morbid obesity, CAD and he used Modafinil for EDS. He was recently told that he will need a liver transplant in response to a NASH nonalcoholic steatosis , the Liver is cirrhotic. Had pancreatic failure- He states that his pancreas no longer produces either digestive enzymes nor insulin. Has diabetes and uses a no needle check device. CPAP: His C-Flex machine was interrogated today , it is a Presenter, broadcasting auto CPAP- this device had been recalled.  He has used it compliantly for 97% of the last 30 days, his usual average daily user time is 4 hours 42 minutes and he achieves a reduced AHI of only 0.3/h . This CPAP is set at 9 cm water.  There are sometimes air leaks but these seem to be related to movement going to the bathroom etc. The humidification setting has been medium, he has a C-Flex function of 1 cm water, the equivalent of an expiratory pressure relief.  He is on 10 cm ramp starting at 4 cmH2O pressure and reaching his max pressure of 9 cm. He never used an ozone cleaner- his machine doesn't make noise and he has not noted particles in his water-chamber.  Epworth sleepiness score: 4/24.  BMI: 35.36 kg/m  FINDINGS:   Total Record Time (hours, min): 7 h 15 min  Total Sleep Time (hours, min):  5 h 40 min   Percent REM (%):    20.26 %   Calculated pAHI (per hour):  5.9       REM pAHI:    19.1     NREM pAHI: 2.5 Supine AHI: N/A   Oxygen Saturation (%)  Mean: 93  Minimum oxygen saturation (%):        87   O2 Saturation Range (%): 87-98  O2Saturation (minutes) <=88%: 2.4 min   Pulse Mean (bpm):    78  Pulse Range (68-94)   IMPRESSION: Mild OSA (obstructive sleep apnea),  OSA is much reduced to an AHI of only 5.9/h overall and REM AHI of 19.1/h. There was no prolonged hypoxia and no tachy-bradycardia noted.   RECOMMENDATION: The patient has struggled with Excessive Daytime Sleepiness in the past, he can continue to use CPAP if his degree of sleepiness benefits from this or has the option to discontinue the therapy. Apnea is mild enough to make PAP therapy optional.       INTERPRETING PHYSICIAN:  Larey Seat, MD

## 2020-11-26 ENCOUNTER — Encounter: Payer: Self-pay | Admitting: Neurology

## 2020-11-27 ENCOUNTER — Telehealth: Payer: Self-pay | Admitting: Neurology

## 2020-11-27 NOTE — Telephone Encounter (Signed)
I called pt. I advised pt that Dr. Brett Fairy reviewed their sleep study results and found that pt has very mild OSA. Dr. Brett Fairy recommends that pt starts auto CPAP or he can choose to not move forward with set up on new machine given the mild apnea. I advised given medical history and excessive daytime sleepiness concerns, may be beneficial to continue treatment. I reviewed PAP compliance expectations with the pt. Pt is agreeable to starting a CPAP. I advised pt that an order will be sent to a DME, [facility] and [facility] will call the pt within about one week after they file with the pt's insurance. [facility]) will show the pt how to use the machine, fit for masks, and troubleshoot the CPAP if needed. A follow up appt will need to be made for insurance purposes with Dr. Brett Fairy or the NP.  Pt verbalized understanding to call and schedule the apt 31-90 days from initial CPAP set up. A letter with all of this information in it will be sent to the pt as a reminder. I verified with the pt that the address we have on file is correct. Pt verbalized understanding of results. Pt had no questions at this time but was encouraged to call back if questions arise. I have sent the order to [facility]) and have received confirmation that they have received the order.

## 2020-11-27 NOTE — Telephone Encounter (Signed)
-----   Message from Larey Seat, MD sent at 11/25/2020  5:19 PM EST ----- IMPRESSION: Mild OSA (obstructive sleep apnea),  OSA is much reduced to an AHI of only 5.9/h overall and REM AHI of 19.1/h. There was no prolonged hypoxia and no tachy-bradycardia noted.   RECOMMENDATION: The patient has struggled with Excessive Daytime Sleepiness in the past, he can continue to use CPAP if his degree of sleepiness benefits from this or has the option to discontinue the therapy. Apnea is mild enough to make PAP therapy optional.     If he prefers to continue auto-CPAP, I will order a new machine with a setting of 9 cm water, mask of his choice and heated humidification.

## 2020-11-28 ENCOUNTER — Other Ambulatory Visit (HOSPITAL_COMMUNITY): Payer: Self-pay

## 2020-11-28 DIAGNOSIS — D696 Thrombocytopenia, unspecified: Secondary | ICD-10-CM

## 2020-11-28 DIAGNOSIS — D890 Polyclonal hypergammaglobulinemia: Secondary | ICD-10-CM

## 2020-11-28 DIAGNOSIS — D509 Iron deficiency anemia, unspecified: Secondary | ICD-10-CM

## 2020-12-01 ENCOUNTER — Other Ambulatory Visit (HOSPITAL_COMMUNITY)
Admission: RE | Admit: 2020-12-01 | Discharge: 2020-12-01 | Disposition: A | Discharge status: Home / Self Care | Payer: Federal, State, Local not specified - PPO | Source: Ambulatory Visit | Attending: Nephrology | Admitting: Nephrology

## 2020-12-01 ENCOUNTER — Inpatient Hospital Stay (HOSPITAL_COMMUNITY): Payer: Federal, State, Local not specified - PPO

## 2020-12-01 ENCOUNTER — Other Ambulatory Visit: Payer: Self-pay | Admitting: Neurology

## 2020-12-01 ENCOUNTER — Inpatient Hospital Stay (HOSPITAL_COMMUNITY): Payer: Federal, State, Local not specified - PPO | Attending: Medical

## 2020-12-01 ENCOUNTER — Other Ambulatory Visit: Payer: Self-pay

## 2020-12-01 DIAGNOSIS — R161 Splenomegaly, not elsewhere classified: Secondary | ICD-10-CM | POA: Diagnosis not present

## 2020-12-01 DIAGNOSIS — Z794 Long term (current) use of insulin: Secondary | ICD-10-CM | POA: Diagnosis not present

## 2020-12-01 DIAGNOSIS — I130 Hypertensive heart and chronic kidney disease with heart failure and stage 1 through stage 4 chronic kidney disease, or unspecified chronic kidney disease: Secondary | ICD-10-CM | POA: Diagnosis not present

## 2020-12-01 DIAGNOSIS — G4733 Obstructive sleep apnea (adult) (pediatric): Secondary | ICD-10-CM | POA: Diagnosis not present

## 2020-12-01 DIAGNOSIS — N183 Chronic kidney disease, stage 3 unspecified: Secondary | ICD-10-CM | POA: Diagnosis not present

## 2020-12-01 DIAGNOSIS — I2510 Atherosclerotic heart disease of native coronary artery without angina pectoris: Secondary | ICD-10-CM | POA: Insufficient documentation

## 2020-12-01 DIAGNOSIS — I959 Hypotension, unspecified: Secondary | ICD-10-CM | POA: Insufficient documentation

## 2020-12-01 DIAGNOSIS — E785 Hyperlipidemia, unspecified: Secondary | ICD-10-CM | POA: Diagnosis not present

## 2020-12-01 DIAGNOSIS — E1122 Type 2 diabetes mellitus with diabetic chronic kidney disease: Secondary | ICD-10-CM | POA: Insufficient documentation

## 2020-12-01 DIAGNOSIS — N189 Chronic kidney disease, unspecified: Secondary | ICD-10-CM | POA: Diagnosis not present

## 2020-12-01 DIAGNOSIS — N170 Acute kidney failure with tubular necrosis: Secondary | ICD-10-CM | POA: Diagnosis not present

## 2020-12-01 DIAGNOSIS — I5032 Chronic diastolic (congestive) heart failure: Secondary | ICD-10-CM | POA: Insufficient documentation

## 2020-12-01 DIAGNOSIS — D890 Polyclonal hypergammaglobulinemia: Secondary | ICD-10-CM

## 2020-12-01 DIAGNOSIS — Z79899 Other long term (current) drug therapy: Secondary | ICD-10-CM | POA: Insufficient documentation

## 2020-12-01 DIAGNOSIS — E871 Hypo-osmolality and hyponatremia: Secondary | ICD-10-CM | POA: Diagnosis not present

## 2020-12-01 DIAGNOSIS — Z7982 Long term (current) use of aspirin: Secondary | ICD-10-CM | POA: Insufficient documentation

## 2020-12-01 DIAGNOSIS — K7460 Unspecified cirrhosis of liver: Secondary | ICD-10-CM | POA: Insufficient documentation

## 2020-12-01 DIAGNOSIS — D509 Iron deficiency anemia, unspecified: Secondary | ICD-10-CM | POA: Diagnosis not present

## 2020-12-01 DIAGNOSIS — D696 Thrombocytopenia, unspecified: Secondary | ICD-10-CM | POA: Insufficient documentation

## 2020-12-01 DIAGNOSIS — D539 Nutritional anemia, unspecified: Secondary | ICD-10-CM | POA: Insufficient documentation

## 2020-12-01 DIAGNOSIS — E875 Hyperkalemia: Secondary | ICD-10-CM | POA: Diagnosis not present

## 2020-12-01 DIAGNOSIS — E876 Hypokalemia: Secondary | ICD-10-CM | POA: Insufficient documentation

## 2020-12-01 DIAGNOSIS — M129 Arthropathy, unspecified: Secondary | ICD-10-CM | POA: Insufficient documentation

## 2020-12-01 DIAGNOSIS — D631 Anemia in chronic kidney disease: Secondary | ICD-10-CM | POA: Insufficient documentation

## 2020-12-01 DIAGNOSIS — Z9989 Dependence on other enabling machines and devices: Secondary | ICD-10-CM

## 2020-12-01 LAB — CBC WITH DIFFERENTIAL/PLATELET
Abs Immature Granulocytes: 0.04 10*3/uL (ref 0.00–0.07)
Basophils Absolute: 0.1 10*3/uL (ref 0.0–0.1)
Basophils Relative: 1 %
Eosinophils Absolute: 0.2 10*3/uL (ref 0.0–0.5)
Eosinophils Relative: 3 %
HCT: 37.7 % — ABNORMAL LOW (ref 39.0–52.0)
Hemoglobin: 13.3 g/dL (ref 13.0–17.0)
Immature Granulocytes: 1 %
Lymphocytes Relative: 23 %
Lymphs Abs: 1.6 10*3/uL (ref 0.7–4.0)
MCH: 34.5 pg — ABNORMAL HIGH (ref 26.0–34.0)
MCHC: 35.3 g/dL (ref 30.0–36.0)
MCV: 97.7 fL (ref 80.0–100.0)
Monocytes Absolute: 0.5 10*3/uL (ref 0.1–1.0)
Monocytes Relative: 7 %
Neutro Abs: 4.6 10*3/uL (ref 1.7–7.7)
Neutrophils Relative %: 65 %
Platelets: 78 10*3/uL — ABNORMAL LOW (ref 150–400)
RBC: 3.86 MIL/uL — ABNORMAL LOW (ref 4.22–5.81)
RDW: 14 % (ref 11.5–15.5)
WBC: 7 10*3/uL (ref 4.0–10.5)
nRBC: 0 % (ref 0.0–0.2)

## 2020-12-01 LAB — RENAL FUNCTION PANEL
Albumin: 3.9 g/dL (ref 3.5–5.0)
Anion gap: 9 (ref 5–15)
BUN: 39 mg/dL — ABNORMAL HIGH (ref 8–23)
CO2: 26 mmol/L (ref 22–32)
Calcium: 9.5 mg/dL (ref 8.9–10.3)
Chloride: 90 mmol/L — ABNORMAL LOW (ref 98–111)
Creatinine, Ser: 1.89 mg/dL — ABNORMAL HIGH (ref 0.61–1.24)
GFR, Estimated: 40 mL/min — ABNORMAL LOW (ref >60)
Glucose, Bld: 243 mg/dL — ABNORMAL HIGH (ref 70–99)
Phosphorus: 3.9 mg/dL (ref 2.5–4.6)
Potassium: 4.7 mmol/L (ref 3.5–5.1)
Sodium: 125 mmol/L — ABNORMAL LOW (ref 135–145)

## 2020-12-01 LAB — COMPREHENSIVE METABOLIC PANEL
ALT: 22 U/L (ref 0–44)
AST: 37 U/L (ref 15–41)
Albumin: 3.9 g/dL (ref 3.5–5.0)
Alkaline Phosphatase: 64 U/L (ref 38–126)
Anion gap: 9 (ref 5–15)
BUN: 40 mg/dL — ABNORMAL HIGH (ref 8–23)
CO2: 26 mmol/L (ref 22–32)
Calcium: 9.5 mg/dL (ref 8.9–10.3)
Chloride: 89 mmol/L — ABNORMAL LOW (ref 98–111)
Creatinine, Ser: 1.92 mg/dL — ABNORMAL HIGH (ref 0.61–1.24)
GFR, Estimated: 39 mL/min — ABNORMAL LOW (ref >60)
Glucose, Bld: 241 mg/dL — ABNORMAL HIGH (ref 70–99)
Potassium: 4.8 mmol/L (ref 3.5–5.1)
Sodium: 124 mmol/L — ABNORMAL LOW (ref 135–145)
Total Bilirubin: 1.5 mg/dL — ABNORMAL HIGH (ref 0.3–1.2)
Total Protein: 8 g/dL (ref 6.5–8.1)

## 2020-12-01 LAB — PROTEIN / CREATININE RATIO, URINE
Creatinine, Urine: 26.19 mg/dL
Total Protein, Urine: <6 mg/dL

## 2020-12-01 LAB — IRON AND TIBC
Iron: 127 ug/dL (ref 45–182)
Saturation Ratios: 35 % (ref 17.9–39.5)
TIBC: 368 ug/dL (ref 250–450)
UIBC: 241 ug/dL

## 2020-12-01 LAB — FOLATE: Folate: 10.1 ng/mL (ref >5.9)

## 2020-12-01 LAB — VITAMIN D 25 HYDROXY (VIT D DEFICIENCY, FRACTURES): Vit D, 25-Hydroxy: 57.4 ng/mL (ref 30–100)

## 2020-12-01 LAB — VITAMIN B12: Vitamin B-12: 693 pg/mL (ref 180–914)

## 2020-12-01 LAB — LACTATE DEHYDROGENASE: LDH: 195 U/L — ABNORMAL HIGH (ref 98–192)

## 2020-12-01 LAB — FERRITIN: Ferritin: 301 ng/mL (ref 24–336)

## 2020-12-02 DIAGNOSIS — I8500 Esophageal varices without bleeding: Secondary | ICD-10-CM | POA: Diagnosis not present

## 2020-12-02 DIAGNOSIS — E1165 Type 2 diabetes mellitus with hyperglycemia: Secondary | ICD-10-CM | POA: Diagnosis not present

## 2020-12-02 DIAGNOSIS — Z01818 Encounter for other preprocedural examination: Secondary | ICD-10-CM | POA: Diagnosis not present

## 2020-12-02 DIAGNOSIS — K766 Portal hypertension: Secondary | ICD-10-CM | POA: Diagnosis not present

## 2020-12-02 DIAGNOSIS — Z794 Long term (current) use of insulin: Secondary | ICD-10-CM | POA: Diagnosis not present

## 2020-12-02 DIAGNOSIS — E669 Obesity, unspecified: Secondary | ICD-10-CM | POA: Diagnosis not present

## 2020-12-02 DIAGNOSIS — K7460 Unspecified cirrhosis of liver: Secondary | ICD-10-CM | POA: Diagnosis not present

## 2020-12-02 DIAGNOSIS — D696 Thrombocytopenia, unspecified: Secondary | ICD-10-CM | POA: Diagnosis not present

## 2020-12-02 DIAGNOSIS — Z008 Encounter for other general examination: Secondary | ICD-10-CM | POA: Diagnosis not present

## 2020-12-08 NOTE — Progress Notes (Signed)
[place], [place] [address]   CLINIC:  Medical Oncology/Hematology  PCP:  [name], [place] / [place] [address]  [phone]  REASON FOR VISIT:  Follow-up for thrombocytopenia  PRIOR THERAPY: Feraheme last on 05/02/2020  CURRENT THERAPY: Observation  INTERVAL HISTORY:  [name], a 59 y.o. male, returns for routine follow-up for his thrombocytopenia. [name] was last seen by [name] on 07/17/2020.  Today he reports that he has been doing well.  He was reportedly seen by [facility] last week.  Nadolol was discontinued and carvedilol was started.  Denies any chest pains or lightheadedness.Denies any bleeding per rectum or melena.  REVIEW OF SYSTEMS:  Review of Systems  All other systems reviewed and are negative.   PAST MEDICAL/SURGICAL HISTORY:  Past Medical History:  Diagnosis Date  . Anemia   . Arthritis   . CAD (coronary artery disease)    Multivessel disease status post CABG 08/2015  . Cirrhosis ([facility])   . CKD (chronic kidney disease) stage 3, GFR 30-59 ml/min (HCC)   . Diastolic CHF ([facility])   . Essential hypertension   . Hyperlipidemia   . OSA on CPAP   . Polyclonal gammopathy   . Thrombocytopenia ([facility]) 2016  . Type 2 diabetes mellitus ([facility])    Past Surgical History:  Procedure Laterality Date  . APPENDECTOMY    . Biceps tendon surgery Right   . BIOPSY  11/22/2019   Procedure: BIOPSY;  Surgeon: Daneil Dolin, MD;  [facility];  Service: Endoscopy;;  . CARDIAC CATHETERIZATION N/A 08/25/2015   Procedure: Left Heart Cath and Coronary Angiography;  Surgeon: Belva Crome, MD;  Location: [facility];  Service: Cardiovascular;  Laterality: N/A;  . COLONOSCOPY WITH PROPOFOL N/A 11/22/2019   Procedure: COLONOSCOPY WITH PROPOFOL;  Surgeon: Daneil Dolin, MD; Four 4-5 mm polyps, findings suggestive of portal colopathy, congested appearing colonic mucosa  diffusely, rectal varices, and adequate right colon prep.  Pathology with tubular adenomas and hyperplastic polyp.  Right colon biopsy with focal active colitis.  Recommendations to repeat colonoscopy in 3 months due to poor prep.  . COLONOSCOPY WITH PROPOFOL N/A 03/17/2020   Procedure: COLONOSCOPY WITH PROPOFOL;  Surgeon: Daneil Dolin, MD;  [facility];  Service: Endoscopy;  Laterality: N/A;  8:45am - pt does not need covid test, was + 2/4 <90 days  . CORONARY ARTERY BYPASS GRAFT N/A 08/29/2015   Procedure: CORONARY ARTERY BYPASS GRAFTING (CABG);  Surgeon: [name], MD;  Location: [facility];  Service: Open Heart Surgery;  Laterality: N/A;  . ESOPHAGOGASTRODUODENOSCOPY (EGD) WITH PROPOFOL N/A 11/22/2019   Procedure: ESOPHAGOGASTRODUODENOSCOPY (EGD) WITH PROPOFOL;  Surgeon: Daneil Dolin, MD; 4 columns of grade 2-3 esophageal varices, portal gastropathy, gastric polyp/abnormal gastric mucosa s/p biopsy.  Pathology with hyperplastic polyp, mild chronic gastritis, negative H. pylori.  [name] KNEE ARTHROSCOPY Left   . TEE WITHOUT CARDIOVERSION N/A 08/29/2015   Procedure: TRANSESOPHAGEAL ECHOCARDIOGRAM (TEE);  Surgeon: [name], MD;  Location: [facility];  Service: Open Heart Surgery;  Laterality: N/A;  . TOTAL KNEE ARTHROPLASTY Left 03/09/2017   Procedure: TOTAL KNEE ARTHROPLASTY;  Surgeon: Carole Civil, MD;  Location: [facility];  Service: Orthopedics;  Laterality: Left;    SOCIAL HISTORY:  Social History   Socioeconomic History  . Marital status: Married    Spouse name: Not on file  . Number of children: 0  .  Years of education: college  . Highest education level: Not on file  Occupational History  . Occupation: Maintenance tech    Employer: [employer]  Tobacco Use  . Smoking status: Never Smoker  . Smokeless tobacco: Never Used  Vaping Use  . Vaping Use: Never used  Substance and Sexual Activity  . Alcohol use: No    Alcohol/week: 0.0 standard drinks  .  Drug use: No  . Sexual activity: Not Currently  Other Topics Concern  . Not on file  Social History Narrative  . Not on file   Social Determinants of Health   Financial Resource Strain: Not on file  Food Insecurity: Not on file  Transportation Needs: Not on file  Physical Activity: Not on file  Stress: Not on file  Social Connections: Not on file  Intimate Partner Violence: Not on file    FAMILY HISTORY:  Family History  Problem Relation Age of Onset  . Arthritis Other   . Cancer Other   . Diabetes Other   . CAD Father   . Diabetes Mellitus II Father   . Liver cancer Father   . Hodgkin's lymphoma Father   . CAD Brother   . Diabetes Mellitus II Brother   . ALS Mother   . Diabetes Mellitus II Sister   . Diabetes Mellitus II Brother   . Diabetes Mellitus II Maternal Grandmother   . Aneurysm Maternal Grandmother   . Cancer Maternal Grandfather   . Anesthesia problems Neg Hx   . Hypotension Neg Hx   . Malignant hyperthermia Neg Hx   . Pseudochol deficiency Neg Hx   . Colon cancer Neg Hx     CURRENT MEDICATIONS:  Current Outpatient Medications  Medication Sig Dispense Refill  . albuterol (VENTOLIN HFA) 108 (90 Base) MCG/ACT inhaler Inhale 2 puffs into the lungs every 6 (six) hours as needed for wheezing or shortness of breath. 18 g 0  . Armodafinil 200 MG TABS Take 200 mg by mouth daily as needed (for sleepiness). 30 tablet 2  . aspirin EC 81 MG tablet Take 81 mg by mouth at bedtime.     . Cholecalciferol (DIALYVITE VITAMIN D 5000) 125 MCG (5000 UT) capsule Take 5,000 Units by mouth in the morning.     [name] Bandages & Supports (MEDICAL COMPRESSION STOCKINGS) MISC 1 each by Does not apply route as directed. Low pressure knee high compression stockings Dx: leg swelling 1 each 0  . ELDERBERRY PO Take by mouth at bedtime. With  Vitamin c and zinc     . ferrous sulfate 325 (65 FE) MG tablet Take 325 mg by mouth in the morning.     . Flaxseed, Linseed, (FLAXSEED OIL PO)  Take 1,400 mg by mouth at bedtime.     . insulin aspart (NOVOLOG) 100 UNIT/ML injection Inject 5-30 Units into the skin 3 (three) times daily before meals.     . lactulose (CHRONULAC) 10 GM/15ML solution Take 30 mLs (20 g total) by mouth daily. 236 mL 0  . loratadine (CLARITIN) 10 MG tablet Take 10 mg by mouth in the morning.     . Magnesium 250 MG TABS Take 250 mg by mouth daily.    . nadolol (CORGARD) 20 MG tablet Take 20 mg by mouth daily.    . nadolol (CORGARD) 40 MG tablet TAKE 1 TABLET(40 MG) BY MOUTH DAILY 30 tablet 3  . omeprazole (PRILOSEC) 40 MG capsule Take 40 mg by mouth at bedtime.  2  .  pravastatin (PRAVACHOL) 20 MG tablet TAKE 1 TABLET(20 MG) BY MOUTH AT BEDTIME 90 tablet 3  . spironolactone (ALDACTONE) 100 MG tablet Take 0.5 tablets (50 mg total) by mouth daily. 30 tablet 2  . torsemide (DEMADEX) 20 MG tablet Take 60 mg by mouth 2 (two) times daily.    . TRESIBA FLEXTOUCH 200 UNIT/ML SOPN Inject 40-70 Units into the skin See admin instructions. Inject 70 units into the skin in the morning and 40 units at bedtime    . TURMERIC CURCUMIN PO Take 2,000 mg by mouth at bedtime.      No current facility-administered medications for this visit.    ALLERGIES:  No Known Allergies  PHYSICAL EXAM:  Performance status (ECOG): 1 - Symptomatic but completely ambulatory  There were no vitals filed for this visit. Wt Readings from Last 3 Encounters:  11/18/20 254 lb (115.2 kg)  10/16/20 250 lb (113.4 kg)  10/09/20 250 lb 6.4 oz (113.6 kg)   Physical Exam Vitals reviewed.  Constitutional:      Appearance: Normal appearance.  Cardiovascular:     Rate and Rhythm: Normal rate and regular rhythm.     Heart sounds: Normal heart sounds.  Pulmonary:     Breath sounds: Normal breath sounds.  Neurological:     Mental Status: He is alert and oriented to person, place, and time.  Psychiatric:        Mood and Affect: Mood normal.        Behavior: Behavior normal.     LABORATORY DATA:   I have reviewed the labs as listed.  CBC Latest Ref Rng & Units 12/01/2020 11/03/2020 10/17/2020  WBC 4.0 - 10.5 K/uL 7.0 5.0 5.4  Hemoglobin 13.0 - 17.0 g/dL 13.3 12.3(L) 11.3(L)  Hematocrit 39.0 - 52.0 % 37.7(L) 36.9(L) 30.9(L)  Platelets 150 - 400 K/uL 78(L) 72(L) 91(L)   CMP Latest Ref Rng & Units 12/01/2020 12/01/2020 11/25/2020  Glucose 70 - 99 mg/dL 243(H) 241(H) -  BUN 8 - 23 mg/dL 39(H) 40(H) -  Creatinine 0.61 - 1.24 mg/dL 1.89(H) 1.92(H) -  Sodium 135 - 145 mmol/L 125(L) 124(L) -  Potassium 3.5 - 5.1 mmol/L 4.7 4.8 4.2  Chloride 98 - 111 mmol/L 90(L) 89(L) -  CO2 22 - 32 mmol/L 26 26 -  Calcium 8.9 - 10.3 mg/dL 9.5 9.5 -  Total Protein 6.5 - 8.1 g/dL - 8.0 -  Total Bilirubin 0.3 - 1.2 mg/dL - 1.5(H) -  Alkaline Phos 38 - 126 U/L - 64 -  AST 15 - 41 U/L - 37 -  ALT 0 - 44 U/L - 22 -      Component Value Date/Time   RBC 3.86 (L) 12/01/2020 0933   MCV 97.7 12/01/2020 0933   MCV 84 12/15/2018 1525   MCH 34.5 (H) 12/01/2020 0933   MCHC 35.3 12/01/2020 0933   RDW 14.0 12/01/2020 0933   RDW 15.1 12/15/2018 1525   LYMPHSABS 1.6 12/01/2020 0933   LYMPHSABS 1.5 12/15/2018 1525   MONOABS 0.5 12/01/2020 0933   EOSABS 0.2 12/01/2020 0933   EOSABS 0.1 12/15/2018 1525   BASOSABS 0.1 12/01/2020 0933   BASOSABS 0.0 12/15/2018 1525   Lab Results  Component Value Date   VD25OH 57.40 12/01/2020   VD25OH 92.38 03/26/2020   Lab Results  Component Value Date   TIBC 368 12/01/2020   TIBC 264 07/10/2020   TIBC 288 06/19/2020   FERRITIN 301 12/01/2020   FERRITIN 291 07/10/2020   FERRITIN 208 06/19/2020  IRONPCTSAT 35 12/01/2020   IRONPCTSAT 35 07/10/2020   IRONPCTSAT 20 06/19/2020    DIAGNOSTIC IMAGING:  I have independently reviewed the scans and discussed with the patient. Home sleep test  Result Date: 11/12/2020 Larey Seat, MD     11/25/2020  5:17 PM [facility] HOME SLEEP TEST (Watch PAT) STUDY DATE: data load on 11/19/20  DOB: [birth year] MRN: [id number] ORDERING CLINICIAN: Larey Seat, MD  REFERRING CLINICIAN: [name], MD CLINICAL INFORMATION/HISTORY: 11.4.2021; [name]. Purk "[name]" is a meanwhile  59 year old Caucasian gentleman who has followed in our practice for over 5 years with a diagnosis of obstructive sleep apnea snoring and is being treated on CPAP.  His risk factors have always included morbid obesity, CAD and he used Modafinil for EDS. He was recently told that he will need a liver transplant in response to a NASH nonalcoholic steatosis , the Liver is cirrhotic. Had pancreatic failure- He states that his pancreas no longer produces either digestive enzymes nor insulin. Has diabetes and uses a no needle check device. CPAP: His C-Flex machine was interrogated today , it is a Presenter, broadcasting auto CPAP- this device had been recalled.  He has used it compliantly for 97% of the last 30 days, his usual average daily user time is 4 hours 42 minutes and he achieves a reduced AHI of only 0.3/h . This CPAP is set at 9 cm water.  There are sometimes air leaks but these seem to be related to movement going to the bathroom etc. The humidification setting has been medium, he has a C-Flex function of 1 cm water, the equivalent of an expiratory pressure relief.  He is on 10 cm ramp starting at 4 cmH2O pressure and reaching his max pressure of 9 cm. He never used an ozone cleaner- his machine doesn't make noise and he has not noted particles in his water-chamber. Epworth sleepiness score: 4/24. BMI: 35.36 kg/m FINDINGS: Total Record Time (hours, min): 7 h 15 min Total Sleep Time (hours, min):  5 h 40 min Percent REM (%):    20.26 % Calculated pAHI (per hour):  5.9       REM pAHI:    19.1     NREM pAHI: 2.5 Supine AHI: N/A  Oxygen Saturation (%) Mean: 93  Minimum oxygen saturation (%):        87 O2 Saturation Range (%): 87-98  O2Saturation (minutes) <=88%: 2.4 min Pulse Mean (bpm):    78  Pulse Range (68-94)  IMPRESSION: Mild OSA (obstructive sleep apnea), OSA is much reduced to an AHI of only 5.9/h overall and REM AHI of 19.1/h. There was no prolonged hypoxia and no tachy-bradycardia noted. RECOMMENDATION: The patient has struggled with Excessive Daytime Sleepiness in the past, he can continue to use CPAP if his degree of sleepiness benefits from this or has the option to discontinue the therapy. Apnea is mild enough to make PAP therapy optional.   INTERPRETING PHYSICIAN: Larey Seat, MD      ASSESSMENT:  1.  Moderate thrombocytopenia: -Thrombocytopenia since 2016.  Platelets between 55-90 since 2018. -BMBX on 01/26/2019 showed slightly hypercellular marrow for age with trilineage hematopoiesis.  No significant dyspoiesis or increase in blasts.  Plasma cells are slightly increased in number but with polyclonal staining pattern.  Abundant megakaryocytes with scattered small hypolobulated forms.  Chromosome analysis was normal.  Biopsy was done for work-up of increased IgA levels. -Ultrasound abdomen on 01/15/2020 showed nodular contour of the liver consistent with cirrhosis.  Splenomegaly with volume of 1562 mL. -Thrombocytopenia secondary to splenomegaly from cirrhosis.  2.  Normocytic to macrocytic anemia: -CBC on 03/26/2020 shows hemoglobin 11.8.  MCV is 100. -He does have CKD.  Ferritin was 63 and percent saturation was 22. -He is feeling very tired.  This is a combination anemia from CKD and iron deficiency. -Colonoscopy on 03/17/2020 showed inadequate preparation of the colon with rectal varices.  Much of the colon could not be seen. -Last Feraheme infusion was in May 2021.  3.  Positive rheumatoid factor: -Work-up for thrombocytopenia showed rheumatoid factor positive at 58.6.  ANA was negative. -He does not have any clinical signs or symptoms of rheumatoid arthritis.   PLAN:  1.  Moderate thrombocytopenia: -CBC from 12/01/2020 shows platelet count stable around 78.  No bleeding  issues. -Recommend follow-up in 4 months.  2.  Normocytic to macrocytic anemia: -Hemoglobin is 13.3.  Ferritin is 301 and saturations 135.  Received Feraheme in May 2021.  No indication for parenteral iron therapy.  Repeat labs in 4 months.  3.  Hypotension: -His blood pressure in the office was 68/42 on manual exam.  Pulse rate was 88. -He denied any chest pains or lightheadedness initially. -He was started on carvedilol 6.25 mg twice daily last week at [facility] hepatology clinic.  Nadolol was discontinued.  He is also on spironolactone and torsemide. -I have recommended cutting back on carvedilol to 3.125 mg twice daily.  He was told to skip tonight's carvedilol dose. -While he was walking out of her clinic he was having difficulty walking with unsteady gait.  He denied any falls. -He will be sent to ER for evaluation.   Orders placed this encounter:  No orders of the defined types were placed in this encounter.    Derek Jack, MD [place] [phone]   I, Milinda Antis, am acting as a scribe for Dr. Sanda Linger.  I, Derek Jack MD, have reviewed the above documentation for accuracy and completeness, and I agree with the above.

## 2020-12-09 ENCOUNTER — Emergency Department (HOSPITAL_COMMUNITY): Payer: Federal, State, Local not specified - PPO

## 2020-12-09 ENCOUNTER — Other Ambulatory Visit: Payer: Self-pay

## 2020-12-09 ENCOUNTER — Inpatient Hospital Stay (HOSPITAL_BASED_OUTPATIENT_CLINIC_OR_DEPARTMENT_OTHER): Payer: Federal, State, Local not specified - PPO | Admitting: Hematology

## 2020-12-09 ENCOUNTER — Encounter (HOSPITAL_COMMUNITY): Payer: Self-pay | Admitting: Internal Medicine

## 2020-12-09 ENCOUNTER — Other Ambulatory Visit (HOSPITAL_COMMUNITY): Payer: Self-pay | Admitting: Hematology

## 2020-12-09 ENCOUNTER — Inpatient Hospital Stay (HOSPITAL_COMMUNITY)
Admission: EM | Admit: 2020-12-09 | Discharge: 2020-12-16 | DRG: 683 | Disposition: A | Discharge status: Skilled Nursing Facility | Payer: Federal, State, Local not specified - PPO | Attending: Internal Medicine | Admitting: Internal Medicine

## 2020-12-09 ENCOUNTER — Other Ambulatory Visit (HOSPITAL_COMMUNITY): Payer: Self-pay

## 2020-12-09 VITALS — BP 68/42 | HR 88 | Temp 97.0°F | Resp 20 | Wt 261.6 lb

## 2020-12-09 DIAGNOSIS — I5032 Chronic diastolic (congestive) heart failure: Secondary | ICD-10-CM | POA: Diagnosis present

## 2020-12-09 DIAGNOSIS — D631 Anemia in chronic kidney disease: Secondary | ICD-10-CM | POA: Diagnosis not present

## 2020-12-09 DIAGNOSIS — K760 Fatty (change of) liver, not elsewhere classified: Secondary | ICD-10-CM | POA: Diagnosis present

## 2020-12-09 DIAGNOSIS — D696 Thrombocytopenia, unspecified: Secondary | ICD-10-CM | POA: Diagnosis not present

## 2020-12-09 DIAGNOSIS — Z96652 Presence of left artificial knee joint: Secondary | ICD-10-CM | POA: Diagnosis present

## 2020-12-09 DIAGNOSIS — Z20822 Contact with and (suspected) exposure to covid-19: Secondary | ICD-10-CM | POA: Diagnosis not present

## 2020-12-09 DIAGNOSIS — E11649 Type 2 diabetes mellitus with hypoglycemia without coma: Secondary | ICD-10-CM | POA: Diagnosis not present

## 2020-12-09 DIAGNOSIS — N179 Acute kidney failure, unspecified: Secondary | ICD-10-CM | POA: Diagnosis not present

## 2020-12-09 DIAGNOSIS — I10 Essential (primary) hypertension: Secondary | ICD-10-CM | POA: Diagnosis not present

## 2020-12-09 DIAGNOSIS — I952 Hypotension due to drugs: Secondary | ICD-10-CM | POA: Diagnosis not present

## 2020-12-09 DIAGNOSIS — E669 Obesity, unspecified: Secondary | ICD-10-CM | POA: Diagnosis not present

## 2020-12-09 DIAGNOSIS — E871 Hypo-osmolality and hyponatremia: Secondary | ICD-10-CM | POA: Diagnosis not present

## 2020-12-09 DIAGNOSIS — E66812 Obesity, class 2: Secondary | ICD-10-CM | POA: Diagnosis present

## 2020-12-09 DIAGNOSIS — N1832 Chronic kidney disease, stage 3b: Secondary | ICD-10-CM | POA: Diagnosis present

## 2020-12-09 DIAGNOSIS — E869 Volume depletion, unspecified: Secondary | ICD-10-CM | POA: Diagnosis present

## 2020-12-09 DIAGNOSIS — E785 Hyperlipidemia, unspecified: Secondary | ICD-10-CM | POA: Diagnosis present

## 2020-12-09 DIAGNOSIS — G4733 Obstructive sleep apnea (adult) (pediatric): Secondary | ICD-10-CM | POA: Diagnosis not present

## 2020-12-09 DIAGNOSIS — D539 Nutritional anemia, unspecified: Secondary | ICD-10-CM | POA: Diagnosis present

## 2020-12-09 DIAGNOSIS — I959 Hypotension, unspecified: Secondary | ICD-10-CM | POA: Diagnosis present

## 2020-12-09 DIAGNOSIS — I9589 Other hypotension: Secondary | ICD-10-CM | POA: Diagnosis not present

## 2020-12-09 DIAGNOSIS — Z888 Allergy status to other drugs, medicaments and biological substances status: Secondary | ICD-10-CM

## 2020-12-09 DIAGNOSIS — Z800 Family history of malignant neoplasm of digestive organs: Secondary | ICD-10-CM

## 2020-12-09 DIAGNOSIS — Z794 Long term (current) use of insulin: Secondary | ICD-10-CM | POA: Diagnosis not present

## 2020-12-09 DIAGNOSIS — Z8249 Family history of ischemic heart disease and other diseases of the circulatory system: Secondary | ICD-10-CM

## 2020-12-09 DIAGNOSIS — I2510 Atherosclerotic heart disease of native coronary artery without angina pectoris: Secondary | ICD-10-CM | POA: Diagnosis not present

## 2020-12-09 DIAGNOSIS — K7460 Unspecified cirrhosis of liver: Secondary | ICD-10-CM | POA: Diagnosis present

## 2020-12-09 DIAGNOSIS — I130 Hypertensive heart and chronic kidney disease with heart failure and stage 1 through stage 4 chronic kidney disease, or unspecified chronic kidney disease: Secondary | ICD-10-CM | POA: Diagnosis not present

## 2020-12-09 DIAGNOSIS — Z7982 Long term (current) use of aspirin: Secondary | ICD-10-CM | POA: Diagnosis not present

## 2020-12-09 DIAGNOSIS — E119 Type 2 diabetes mellitus without complications: Secondary | ICD-10-CM | POA: Diagnosis present

## 2020-12-09 DIAGNOSIS — D6959 Other secondary thrombocytopenia: Secondary | ICD-10-CM | POA: Diagnosis present

## 2020-12-09 DIAGNOSIS — M129 Arthropathy, unspecified: Secondary | ICD-10-CM | POA: Diagnosis not present

## 2020-12-09 DIAGNOSIS — K7031 Alcoholic cirrhosis of liver with ascites: Secondary | ICD-10-CM | POA: Diagnosis not present

## 2020-12-09 DIAGNOSIS — N183 Chronic kidney disease, stage 3 unspecified: Secondary | ICD-10-CM | POA: Diagnosis not present

## 2020-12-09 DIAGNOSIS — Z951 Presence of aortocoronary bypass graft: Secondary | ICD-10-CM

## 2020-12-09 DIAGNOSIS — Z79899 Other long term (current) drug therapy: Secondary | ICD-10-CM | POA: Diagnosis not present

## 2020-12-09 DIAGNOSIS — D509 Iron deficiency anemia, unspecified: Secondary | ICD-10-CM | POA: Diagnosis not present

## 2020-12-09 DIAGNOSIS — IMO0002 Reserved for concepts with insufficient information to code with codable children: Secondary | ICD-10-CM | POA: Diagnosis present

## 2020-12-09 DIAGNOSIS — T502X5A Adverse effect of carbonic-anhydrase inhibitors, benzothiadiazides and other diuretics, initial encounter: Secondary | ICD-10-CM | POA: Diagnosis present

## 2020-12-09 DIAGNOSIS — E1122 Type 2 diabetes mellitus with diabetic chronic kidney disease: Secondary | ICD-10-CM | POA: Diagnosis not present

## 2020-12-09 DIAGNOSIS — Z807 Family history of other malignant neoplasms of lymphoid, hematopoietic and related tissues: Secondary | ICD-10-CM

## 2020-12-09 DIAGNOSIS — E1165 Type 2 diabetes mellitus with hyperglycemia: Secondary | ICD-10-CM | POA: Diagnosis present

## 2020-12-09 DIAGNOSIS — R161 Splenomegaly, not elsewhere classified: Secondary | ICD-10-CM | POA: Diagnosis not present

## 2020-12-09 DIAGNOSIS — Z833 Family history of diabetes mellitus: Secondary | ICD-10-CM

## 2020-12-09 DIAGNOSIS — I950 Idiopathic hypotension: Secondary | ICD-10-CM | POA: Diagnosis not present

## 2020-12-09 DIAGNOSIS — X58XXXA Exposure to other specified factors, initial encounter: Secondary | ICD-10-CM | POA: Diagnosis present

## 2020-12-09 DIAGNOSIS — Z6835 Body mass index (BMI) 35.0-35.9, adult: Secondary | ICD-10-CM

## 2020-12-09 LAB — BASIC METABOLIC PANEL
Anion gap: 13 (ref 5–15)
BUN: 77 mg/dL — ABNORMAL HIGH (ref 8–23)
CO2: 20 mmol/L — ABNORMAL LOW (ref 22–32)
Calcium: 8.1 mg/dL — ABNORMAL LOW (ref 8.9–10.3)
Chloride: 92 mmol/L — ABNORMAL LOW (ref 98–111)
Creatinine, Ser: 4 mg/dL — ABNORMAL HIGH (ref 0.61–1.24)
GFR, Estimated: 16 mL/min — ABNORMAL LOW (ref >60)
Glucose, Bld: 189 mg/dL — ABNORMAL HIGH (ref 70–99)
Potassium: 4.9 mmol/L (ref 3.5–5.1)
Sodium: 125 mmol/L — ABNORMAL LOW (ref 135–145)

## 2020-12-09 LAB — PROTIME-INR
INR: 1.3 — ABNORMAL HIGH (ref 0.8–1.2)
Prothrombin Time: 15.6 seconds — ABNORMAL HIGH (ref 11.4–15.2)

## 2020-12-09 LAB — CBC WITH DIFFERENTIAL/PLATELET
Abs Immature Granulocytes: 0.16 10*3/uL — ABNORMAL HIGH (ref 0.00–0.07)
Basophils Absolute: 0 10*3/uL (ref 0.0–0.1)
Basophils Relative: 0 %
Eosinophils Absolute: 0.1 10*3/uL (ref 0.0–0.5)
Eosinophils Relative: 1 %
HCT: 35.8 % — ABNORMAL LOW (ref 39.0–52.0)
Hemoglobin: 12.1 g/dL — ABNORMAL LOW (ref 13.0–17.0)
Immature Granulocytes: 1 %
Lymphocytes Relative: 16 %
Lymphs Abs: 1.8 10*3/uL (ref 0.7–4.0)
MCH: 34.3 pg — ABNORMAL HIGH (ref 26.0–34.0)
MCHC: 33.8 g/dL (ref 30.0–36.0)
MCV: 101.4 fL — ABNORMAL HIGH (ref 80.0–100.0)
Monocytes Absolute: 1.1 10*3/uL — ABNORMAL HIGH (ref 0.1–1.0)
Monocytes Relative: 10 %
Neutro Abs: 8.2 10*3/uL — ABNORMAL HIGH (ref 1.7–7.7)
Neutrophils Relative %: 72 %
Platelets: 73 10*3/uL — ABNORMAL LOW (ref 150–400)
RBC: 3.53 MIL/uL — ABNORMAL LOW (ref 4.22–5.81)
RDW: 14.8 % (ref 11.5–15.5)
WBC: 11.4 10*3/uL — ABNORMAL HIGH (ref 4.0–10.5)
nRBC: 0 % (ref 0.0–0.2)

## 2020-12-09 LAB — HEPATIC FUNCTION PANEL
ALT: 20 U/L (ref 0–44)
AST: 26 U/L (ref 15–41)
Albumin: 3.1 g/dL — ABNORMAL LOW (ref 3.5–5.0)
Alkaline Phosphatase: 54 U/L (ref 38–126)
Bilirubin, Direct: 0.2 mg/dL (ref 0.0–0.2)
Indirect Bilirubin: 1 mg/dL — ABNORMAL HIGH (ref 0.3–0.9)
Total Bilirubin: 1.2 mg/dL (ref 0.3–1.2)
Total Protein: 6.2 g/dL — ABNORMAL LOW (ref 6.5–8.1)

## 2020-12-09 LAB — TROPONIN I (HIGH SENSITIVITY)
Troponin I (High Sensitivity): 10 ng/L (ref <18)
Troponin I (High Sensitivity): 8 ng/L (ref <18)

## 2020-12-09 LAB — URINALYSIS, ROUTINE W REFLEX MICROSCOPIC
Bilirubin Urine: NEGATIVE
Glucose, UA: NEGATIVE mg/dL
Hgb urine dipstick: NEGATIVE
Ketones, ur: NEGATIVE mg/dL
Leukocytes,Ua: NEGATIVE
Nitrite: NEGATIVE
Protein, ur: NEGATIVE mg/dL
Specific Gravity, Urine: 1.008 (ref 1.005–1.030)
pH: 5 (ref 5.0–8.0)

## 2020-12-09 LAB — AMMONIA: Ammonia: 18 umol/L (ref 9–35)

## 2020-12-09 LAB — LACTIC ACID, PLASMA
Lactic Acid, Venous: 1.4 mmol/L (ref 0.5–1.9)
Lactic Acid, Venous: 2.1 mmol/L (ref 0.5–1.9)

## 2020-12-09 LAB — RESP PANEL BY RT-PCR (FLU A&B, COVID) ARPGX2
Influenza A by PCR: NEGATIVE
Influenza B by PCR: NEGATIVE
SARS Coronavirus 2 by RT PCR: NEGATIVE

## 2020-12-09 LAB — CBG MONITORING, ED: Glucose-Capillary: 164 mg/dL — ABNORMAL HIGH (ref 70–99)

## 2020-12-09 MED ORDER — MAGNESIUM OXIDE 400 (241.3 MG) MG PO TABS
200.0000 mg | ORAL_TABLET | Freq: Every day | ORAL | Status: DC
  Administered 2020-12-10 – 2020-12-14 (×5): 200 mg via ORAL
  Filled 2020-12-09 (×8): qty 1

## 2020-12-09 MED ORDER — SODIUM CHLORIDE 0.9 % IV SOLN
50.0000 ug/h | INTRAVENOUS | Status: DC
  Administered 2020-12-09: 50 ug/h via INTRAVENOUS
  Administered 2020-12-10: 40 ug/h via INTRAVENOUS
  Filled 2020-12-09 (×5): qty 1

## 2020-12-09 MED ORDER — INSULIN GLARGINE 100 UNIT/ML ~~LOC~~ SOLN
40.0000 [IU] | Freq: Every day | SUBCUTANEOUS | Status: DC
  Administered 2020-12-09: 40 [IU] via SUBCUTANEOUS
  Filled 2020-12-09 (×2): qty 0.4

## 2020-12-09 MED ORDER — MIDODRINE HCL 5 MG PO TABS
5.0000 mg | ORAL_TABLET | Freq: Three times a day (TID) | ORAL | Status: DC
  Administered 2020-12-09 – 2020-12-10 (×3): 5 mg via ORAL
  Filled 2020-12-09 (×6): qty 1

## 2020-12-09 MED ORDER — OCTREOTIDE ACETATE 500 MCG/ML IJ SOLN
INTRAMUSCULAR | Status: AC
  Filled 2020-12-09: qty 1

## 2020-12-09 MED ORDER — CARVEDILOL 3.125 MG PO TABS: 3.1250 mg | ORAL_TABLET | Freq: Two times a day (BID) | ORAL | 0 refills | Status: DC

## 2020-12-09 MED ORDER — INSULIN DEGLUDEC 200 UNIT/ML ~~LOC~~ SOPN
40.0000 [IU] | PEN_INJECTOR | SUBCUTANEOUS | Status: DC
Start: 2020-12-09 — End: 2020-12-09

## 2020-12-09 MED ORDER — INSULIN GLARGINE 100 UNIT/ML ~~LOC~~ SOLN
70.0000 [IU] | Freq: Every day | SUBCUTANEOUS | Status: DC
  Administered 2020-12-10: 70 [IU] via SUBCUTANEOUS
  Filled 2020-12-09 (×3): qty 0.7

## 2020-12-09 MED ORDER — ALPRAZOLAM 0.5 MG PO TABS
0.5000 mg | ORAL_TABLET | Freq: Every day | ORAL | Status: DC | PRN
  Administered 2020-12-14 – 2020-12-15 (×2): 0.5 mg via ORAL
  Filled 2020-12-09 (×2): qty 1

## 2020-12-09 MED ORDER — VITAMIN D 25 MCG (1000 UNIT) PO TABS
5000.0000 [IU] | ORAL_TABLET | Freq: Every morning | ORAL | Status: DC
  Administered 2020-12-10 – 2020-12-16 (×7): 5000 [IU] via ORAL
  Filled 2020-12-09 (×8): qty 5

## 2020-12-09 MED ORDER — INSULIN ASPART 100 UNIT/ML ~~LOC~~ SOLN
0.0000 [IU] | Freq: Three times a day (TID) | SUBCUTANEOUS | Status: DC
  Administered 2020-12-10: 2 [IU] via SUBCUTANEOUS
  Administered 2020-12-11: 5 [IU] via SUBCUTANEOUS
  Administered 2020-12-11 – 2020-12-12 (×2): 3 [IU] via SUBCUTANEOUS

## 2020-12-09 MED ORDER — SODIUM CHLORIDE 0.9 % IV BOLUS
1000.0000 mL | Freq: Once | INTRAVENOUS | Status: AC
  Administered 2020-12-09: 1000 mL via INTRAVENOUS

## 2020-12-09 MED ORDER — LACTULOSE 10 GM/15ML PO SOLN
20.0000 g | Freq: Every day | ORAL | Status: DC
  Administered 2020-12-10 – 2020-12-16 (×7): 20 g via ORAL
  Filled 2020-12-09 (×7): qty 30

## 2020-12-09 MED ORDER — ARMODAFINIL 200 MG PO TABS: 200.0000 mg | ORAL_TABLET | Freq: Every day | ORAL | Status: DC | PRN

## 2020-12-09 MED ORDER — ALBUMIN HUMAN 25 % IV SOLN
25.0000 g | Freq: Four times a day (QID) | INTRAVENOUS | Status: AC
  Administered 2020-12-09 – 2020-12-10 (×4): 25 g via INTRAVENOUS
  Filled 2020-12-09 (×6): qty 100

## 2020-12-09 MED ORDER — ALBUTEROL SULFATE HFA 108 (90 BASE) MCG/ACT IN AERS: 2.0000 | INHALATION_SPRAY | Freq: Four times a day (QID) | RESPIRATORY_TRACT | Status: DC | PRN

## 2020-12-09 MED ORDER — SODIUM CHLORIDE 0.9 % IV SOLN: INTRAVENOUS | Status: DC

## 2020-12-09 MED ORDER — ONDANSETRON HCL 4 MG/2ML IJ SOLN: 4.0000 mg | Freq: Four times a day (QID) | INTRAMUSCULAR | Status: DC | PRN

## 2020-12-09 MED ORDER — PRAVASTATIN SODIUM 10 MG PO TABS
20.0000 mg | ORAL_TABLET | Freq: Every day | ORAL | Status: DC
  Administered 2020-12-10 – 2020-12-16 (×7): 20 mg via ORAL
  Filled 2020-12-09 (×6): qty 2
  Filled 2020-12-09: qty 1
  Filled 2020-12-09: qty 2

## 2020-12-09 MED ORDER — ALBUMIN HUMAN 25 % IV SOLN
INTRAVENOUS | Status: AC
  Filled 2020-12-09: qty 100

## 2020-12-09 MED ORDER — ASPIRIN EC 81 MG PO TBEC
81.0000 mg | DELAYED_RELEASE_TABLET | Freq: Every day | ORAL | Status: DC
  Administered 2020-12-09 – 2020-12-15 (×7): 81 mg via ORAL
  Filled 2020-12-09 (×6): qty 1

## 2020-12-09 MED ORDER — LORATADINE 10 MG PO TABS
10.0000 mg | ORAL_TABLET | Freq: Every morning | ORAL | Status: DC
  Administered 2020-12-10 – 2020-12-16 (×7): 10 mg via ORAL
  Filled 2020-12-09 (×7): qty 1

## 2020-12-09 MED ORDER — FERROUS SULFATE 325 (65 FE) MG PO TABS
325.0000 mg | ORAL_TABLET | Freq: Every morning | ORAL | Status: DC
  Administered 2020-12-10 – 2020-12-16 (×7): 325 mg via ORAL
  Filled 2020-12-09 (×6): qty 1

## 2020-12-09 MED ORDER — PANTOPRAZOLE SODIUM 40 MG PO TBEC
40.0000 mg | DELAYED_RELEASE_TABLET | Freq: Every day | ORAL | Status: DC
  Administered 2020-12-10 – 2020-12-16 (×7): 40 mg via ORAL
  Filled 2020-12-09 (×7): qty 1

## 2020-12-09 MED ORDER — MODAFINIL 200 MG PO TABS
200.0000 mg | ORAL_TABLET | Freq: Every day | ORAL | Status: DC | PRN
  Filled 2020-12-09: qty 1

## 2020-12-09 MED ORDER — ONDANSETRON HCL 4 MG PO TABS: 4.0000 mg | ORAL_TABLET | Freq: Four times a day (QID) | ORAL | Status: DC | PRN

## 2020-12-09 NOTE — ED Notes (Signed)
PT in  Trendelenburg for BP

## 2020-12-09 NOTE — ED Notes (Signed)
BP is low, pt feet are up.

## 2020-12-09 NOTE — Progress Notes (Signed)
Upon standing, patient stumbled and nearly fell. Patient reports that he recently began walking in a "zig zag pattern." Dr. Delton Coombes instructed patient to report to the emergency department. Report called to Apolonio Schneiders, ED RN. Patient accompanied to the ED by [facility], LPN.

## 2020-12-09 NOTE — ED Notes (Signed)
Provider at bedside

## 2020-12-09 NOTE — Patient Instructions (Signed)
[place] at [facility] Discharge Instructions  You were seen and examined today by Dr. Delton Coombes.  Your blood counts are doing well today. Your blood pressure is low today. Please do NOT take your Carvedilol tonight. We have sent a new, decreased prescription for you to begin tomorrow. Please ensure that you are not taking Benicar and Metoprolol.  Please go to the Emergency Department immediately if you experience falls, confusion, dizziness or shortness of breath.  Thank you for choosing [place] at [facility] to provide your oncology and hematology care.  To afford each patient quality time with our provider, please arrive at least 15 minutes before your scheduled appointment time.   If you have a lab appointment with the [facility] please come in thru the Main Entrance and check in at the main information desk.  You need to re-schedule your appointment should you arrive 10 or more minutes late.  We strive to give you quality time with our providers, and arriving late affects you and other patients whose appointments are after yours.  Also, if you no show three or more times for appointments you may be dismissed from the clinic at the providers discretion.     Again, thank you for choosing [facility].  Our hope is that these requests will decrease the amount of time that you wait before being seen by our physicians.       _____________________________________________________________  Should you have questions after your visit to [facility], please contact our office at [phone] and follow the prompts.  Our office hours are 8:00 a.m. and 4:30 p.m. Monday - Friday.  Please note that voicemails left after 4:00 p.m. may not be returned until the following business day.  We are closed weekends and major holidays.  You do have access to a nurse 24-7, just call the main number to the clinic [phone] and do not  press any options, hold on the line and a nurse will answer the phone.    For prescription refill requests, have your pharmacy contact our office and allow 72 hours.    Due to Covid, you will need to wear a mask upon entering the hospital. If you do not have a mask, a mask will be given to you at the Main Entrance upon arrival. For doctor visits, patients may have 1 support person age 55 or older with them. For treatment visits, patients can not have anyone with them due to social distancing guidelines and our immunocompromised population.

## 2020-12-09 NOTE — ED Notes (Signed)
Dr. Olevia Bowens paged x1

## 2020-12-09 NOTE — H&P (Signed)
History and Physical    [name] RSW:[id number] DOB: [birth year] DOA: 12/09/2020  PCP: [name], MD   Patient coming from: Home.  I have personally briefly reviewed patient's old medical records in [place]  Chief Complaint: Hypotension.  HPI: [name] is a 59 y.o. male with medical history significant of microcytic anemia, osteoarthritis, CAD/CABG, liver cirrhosis, stage III CKD, chronic diastolic CHF, essential hypertension, hyperlipidemia, OSA on CPAP, polyclonal gammopathy, thrombocytopenia, type II DM who is coming to the emergency department referred from Dr. Tomie China office after developing hypotension at the office.  The patient just yesterday got his carvedilol increased from 3.125 mg to 6.25 mg and states he has been lightheaded since.  He also endorses decreased urination over the past 2 days.  He denies fever, chills, rhinorrhea or sore throat.  No wheezing, dyspnea or hemoptysis.  Denies chest pain, palpitations, diaphoresis, PND, orthopnea, but gets occasional pitting edema of the lower extremities.  No abdominal pain, nausea, vomiting, diarrhea, constipation, melena or hematochezia.  No dysuria, frequency or hematuria, but decreased urination.  No polyuria, polydipsia, polyphagia or blurred vision.  ED Course: Initial vital signs were temperature 98.3 F, pulse 84, RR 22, BP 60/45 mmHg and O2 sat 94% on room air.  The patient received 2 L of normal saline bolus in the emergency department which resulted in an increase of his systolic blood pressure to to the 90s.  Labwork: His urinalysis was normal.  Lactic acid 1.4 and then 2.1 mmol/L.  Coronavirus and influenza PCR was negative.  CBC showed a white count of 11.4 with 72% neutrophils, hemoglobin 12.1 g/dL with an MCV of 101.4 fL and platelets 73.  PT 15.6 and INR 1.3.  His sodium was 125, potassium 4.9, chloride 92 and CO2 20 mmol/L.  Glucose 189, BUN 77 creatinine 4.00 mg/dL.  LFTs show total  protein of 6.2 and albumin of 3.1 g/dL.  Total bilirubin slightly increased to 1.0 mg/dL.  The rest of the hepatic functions are normal.  Troponin was static and then 8 ng/L.  Ammonia was 18 mol/L.  Imaging: His chest radiograph did not show any acute process.  Please see images for regular report for further detail.  Review of Systems: As per HPI otherwise all other systems reviewed and are negative.  Past Medical History:  Diagnosis Date  . Anemia   . Arthritis   . CAD (coronary artery disease)    Multivessel disease status post CABG 08/2015  . Cirrhosis ([place])   . CKD (chronic kidney disease) stage 3, GFR 30-59 ml/min (HCC)   . Diastolic CHF ([place])   . Essential hypertension   . Hyperlipidemia   . OSA on CPAP   . Polyclonal gammopathy   . Thrombocytopenia ([place]) 2016  . Type 2 diabetes mellitus ([place])    Past Surgical History:  Procedure Laterality Date  . APPENDECTOMY    . Biceps tendon surgery Right   . BIOPSY  11/22/2019   Procedure: BIOPSY;  Surgeon: Daneil Dolin, MD;  [facility];  Service: Endoscopy;;  . CARDIAC CATHETERIZATION N/A 08/25/2015   Procedure: Left Heart Cath and Coronary Angiography;  Surgeon: Belva Crome, MD;  Location: [place] [facility];  Service: Cardiovascular;  Laterality: N/A;  . COLONOSCOPY WITH PROPOFOL N/A 11/22/2019   Procedure: COLONOSCOPY WITH PROPOFOL;  Surgeon: Daneil Dolin, MD; Four 4-5 mm polyps, findings suggestive of portal colopathy, congested appearing colonic mucosa diffusely, rectal varices, and adequate right colon prep.  Pathology with tubular adenomas and hyperplastic polyp.  Right colon biopsy with focal active colitis.  Recommendations to repeat colonoscopy in 3 months due to poor prep.  . COLONOSCOPY WITH PROPOFOL N/A 03/17/2020   Procedure: COLONOSCOPY WITH PROPOFOL;  Surgeon: Daneil Dolin, MD;  [facility];  Service: Endoscopy;  Laterality: N/A;  8:45am - pt does not need covid test, was + 2/4 <90 days   . CORONARY ARTERY BYPASS GRAFT N/A 08/29/2015   Procedure: CORONARY ARTERY BYPASS GRAFTING (CABG);  Surgeon: [name], MD;  Location: [place];  Service: Open Heart Surgery;  Laterality: N/A;  . ESOPHAGOGASTRODUODENOSCOPY (EGD) WITH PROPOFOL N/A 11/22/2019   Procedure: ESOPHAGOGASTRODUODENOSCOPY (EGD) WITH PROPOFOL;  Surgeon: Daneil Dolin, MD; 4 columns of grade 2-3 esophageal varices, portal gastropathy, gastric polyp/abnormal gastric mucosa s/p biopsy.  Pathology with hyperplastic polyp, mild chronic gastritis, negative H. pylori.  [name] KNEE ARTHROSCOPY Left   . TEE WITHOUT CARDIOVERSION N/A 08/29/2015   Procedure: TRANSESOPHAGEAL ECHOCARDIOGRAM (TEE);  Surgeon: [name], MD;  Location: [place];  Service: Open Heart Surgery;  Laterality: N/A;  . TOTAL KNEE ARTHROPLASTY Left 03/09/2017   Procedure: TOTAL KNEE ARTHROPLASTY;  Surgeon: Carole Civil, MD;  Location: [facility];  Service: Orthopedics;  Laterality: Left;   Social History  reports that he has never smoked. He has never used smokeless tobacco. He reports that he does not drink alcohol and does not use drugs.  Allergies  Allergen Reactions  . Fluticasone Rash   Family History  Problem Relation Age of Onset  . Arthritis Other   . Cancer Other   . Diabetes Other   . CAD Father   . Diabetes Mellitus II Father   . Liver cancer Father   . Hodgkin's lymphoma Father   . CAD Brother   . Diabetes Mellitus II Brother   . ALS Mother   . Diabetes Mellitus II Sister   . Diabetes Mellitus II Brother   . Diabetes Mellitus II Maternal Grandmother   . Aneurysm Maternal Grandmother   . Cancer Maternal Grandfather   . Anesthesia problems Neg Hx   . Hypotension Neg Hx   . Malignant hyperthermia Neg Hx   . Pseudochol deficiency Neg Hx   . Colon cancer Neg Hx    Prior to Admission medications   Medication Sig Start Date End Date Taking? Authorizing Provider  albuterol (VENTOLIN HFA) 108 (90 Base) MCG/ACT inhaler Inhale  2 puffs into the lungs every 6 (six) hours as needed for wheezing or shortness of breath. 06/11/19  Yes Padgett, Rae Halsted, MD  ALPRAZolam Duanne Moron) 0.5 MG tablet Take 0.5 mg by mouth daily as needed. 10/03/20  Yes [provider]  Armodafinil 200 MG TABS Take 200 mg by mouth daily as needed (for sleepiness). 11/13/20  Yes Dohmeier, Asencion Partridge, MD  aspirin EC 81 MG tablet Take 81 mg by mouth at bedtime.    Yes [provider]  Cholecalciferol (DIALYVITE VITAMIN D 5000) 125 MCG (5000 UT) capsule Take 5,000 Units by mouth in the morning.    Yes [provider]  insulin aspart (NOVOLOG) 100 UNIT/ML injection Inject 5-30 Units into the skin 3 (three) times daily before meals.    Yes [provider]  lactulose (CHRONULAC) 10 GM/15ML solution Take 30 mLs (20 g total) by mouth daily. 07/02/20  Yes Barton Dubois, MD  loratadine (CLARITIN) 10 MG tablet Take 10 mg by mouth in the morning.    Yes [provider]  omeprazole (PRILOSEC) 40 MG capsule Take 40 mg by mouth at bedtime. 09/13/18  Yes [provider]  OZEMPIC, 1 MG/DOSE, 4 MG/3ML SOPN Inject 1 mg into the skin once a week. 11/24/20  Yes [provider]  pravastatin (PRAVACHOL) 20 MG tablet Take 20 mg by mouth daily.   Yes [provider]  spironolactone (ALDACTONE) 100 MG tablet Take 0.5 tablets (50 mg total) by mouth daily. 08/15/20  Yes Satira Sark, MD  torsemide (DEMADEX) 20 MG tablet Take 60 mg by mouth daily.   Yes [provider]  TRESIBA FLEXTOUCH 200 UNIT/ML SOPN Inject 40-70 Units into the skin See admin instructions. Inject 70 units into the skin in the morning and 40 units at bedtime 01/02/18  Yes [provider]  TURMERIC CURCUMIN PO Take 2,000 mg by mouth at bedtime.    Yes [provider]  carvedilol (COREG) 3.125 MG tablet TAKE 1 TABLET(3.125 MG) BY MOUTH TWICE DAILY WITH A MEAL 12/09/20   Derek Jack, MD  Continuous Blood Gluc  Receiver (FREESTYLE LIBRE 14 DAY READER) [place] 1 reader    [provider]  Continuous Blood Gluc Sensor (FREESTYLE LIBRE 14 DAY SENSOR) MISC 1 sensor 12/28/17   [provider]  Elastic Bandages & Supports ([place]) [place] 1 each by Does not apply route as directed. Low pressure knee high compression stockings Dx: leg swelling 05/13/20   Satira Sark, MD  ELDERBERRY PO Take by mouth at bedtime. With  Vitamin c and zinc    [provider]  ferrous sulfate 325 (65 FE) MG tablet Take 325 mg by mouth in the morning.     [provider]  Flaxseed, Linseed, (FLAXSEED OIL PO) Take 1,400 mg by mouth at bedtime.     [provider]  INS SYRINGE/NEEDLE .5CC/28G 28G X 1/2" 0.5 ML MISC See admin instructions.    [provider]  Magnesium 250 MG TABS Take 250 mg by mouth daily.    [provider]   Physical Exam: Vitals:   12/09/20 1800 12/09/20 1830 12/09/20 1900 12/09/20 1930  BP: (!) 76/46 (!) 76/45 (!) 97/52 (!) 90/55  Pulse: 77 78 85 83  Resp: 18 15 (!) 21 19  Temp:      TempSrc:      SpO2: 98% 99% 97% 98%   Constitutional: Looks chronically ill.  NAD, calm, comfortable Eyes: PERRL, lids and conjunctivae normal ENMT: Mucous membranes are dry.. Posterior pharynx clear of any exudate or lesions. Neck: normal, supple, no masses, no thyromegaly Respiratory: clear to auscultation bilaterally, no wheezing, no crackles. Normal respiratory effort. No accessory muscle use.  Cardiovascular: Regular rate and rhythm, no murmurs / rubs / gallops.  Stage II lymphedema.  Trace bilateral lower extremity pitting edema. 2+ pedal pulses. No carotid bruits.  Abdomen: Obese, bowel sounds positive.  Soft, no tenderness, no masses palpated. No hepatosplenomegaly. Musculoskeletal: no clubbing / cyanosis.  Good ROM, no contractures. Normal muscle tone.  Skin: no rashes, lesions, ulcers on limited dermatological  examination. Neurologic: CN 2-12 grossly intact. Sensation intact, DTR normal. Strength 5/5 in all 4.  Psychiatric: Normal judgment and insight. Alert and oriented x 3. Normal mood.   Labs on Admission: I have personally reviewed following labs and imaging studies  CBC: Recent Labs  Lab 12/09/20 1730  WBC 11.4*  NEUTROABS 8.2*  HGB 12.1*  HCT 35.8*  MCV 101.4*  PLT 73*    Basic Metabolic Panel: Recent Labs  Lab 12/09/20  1730  NA 125*  K 4.9  CL 92*  CO2 20*  GLUCOSE 189*  BUN 77*  CREATININE 4.00*  CALCIUM 8.1*    GFR: Estimated Creatinine Clearance: 25.2 mL/min (A) (by C-G formula based on SCr of 4 mg/dL (H)).  Liver Function Tests: Recent Labs  Lab 12/09/20 1730  AST 26  ALT 20  ALKPHOS 54  BILITOT 1.2  PROT 6.2*  ALBUMIN 3.1*    Urine analysis:    Component Value Date/Time   COLORURINE YELLOW 12/09/2020 1700   APPEARANCEUR CLEAR 12/09/2020 1700   LABSPEC 1.008 12/09/2020 1700   PHURINE 5.0 12/09/2020 1700   GLUCOSEU NEGATIVE 12/09/2020 1700   HGBUR NEGATIVE 12/09/2020 1700   BILIRUBINUR NEGATIVE 12/09/2020 1700   KETONESUR NEGATIVE 12/09/2020 1700   PROTEINUR NEGATIVE 12/09/2020 1700   UROBILINOGEN 1.0 08/28/2015 1634   NITRITE NEGATIVE 12/09/2020 1700   LEUKOCYTESUR NEGATIVE 12/09/2020 1700    Radiological Exams on Admission: DG Chest Portable 1 View  Result Date: 12/09/2020 CLINICAL DATA:  Hypotension EXAM: PORTABLE CHEST 1 VIEW COMPARISON:  10/28/2020 FINDINGS: Single frontal view of the chest demonstrates a stable cardiac silhouette. Postsurgical changes from median sternotomy. No airspace disease, effusion, or pneumothorax. No acute bony abnormalities. IMPRESSION: 1. Stable exam, no acute process. Electronically Signed   By: Randa Ngo M.D.   On: 12/09/2020 17:41   Echocardiogram 05/26/2020  IMPRESSIONS  1. Left ventricular ejection fraction, by estimation, is 60 to 65%. The  left ventricle has normal function. The left ventricle  has no regional  wall motion abnormalities. The left ventricular internal cavity size was  mildly dilated. There is mild left  ventricular hypertrophy. Left ventricular diastolic parameters are  indeterminate.  2. Right ventricular systolic function is normal. The right ventricular  size is normal. There is normal pulmonary artery systolic pressure.  3. The mitral valve is abnormal. Mild mitral valve regurgitation.  4. AV is thickened, calcifieid Difficult to see well. Peak and mean  gradients through the valve are 31 and 18 mm Hg respectively consistent  with mild AS. Compared to echo report from 2019, gradients are increased.  . The aortic valve is abnormal. Aortic  valve regurgitation is not visualized. Mild aortic valve stenosis.  5. The inferior vena cava is dilated in size with <50% respiratory  variability, suggesting right atrial pressure of 15 mmHg.    EKG: Independently reviewed. Vent. rate 83 BPM PR interval * ms QRS duration 96 ms QT/QTc 381/448 ms P-R-T axes 61 79 33 Sinus rhythm Low voltage with right axis deviation  Assessment/Plan Principal Problem:   AKI (acute kidney injury) ([place])   Hypotension Observation/stepdown. Continue IVF NS 125 mL/h. Hold diuretics. Hold beta-blocker. Midodrine 5 mg p.o. 3 times daily. Albumin 25 g every 6 hours x 4. Start octreotide infusion. Monitor intake and output. Follow renal function electrolytes.  Active Problems:   Hyponatremia Secondary to diuretic use/    Hypertension Hold diuretics and beta-blocker. Monitor blood pressure.    Hyperlipidemia Continue pravastatin 40 mg p.o. daily. Monitor LFTs periodically.    CAD (coronary artery disease) Beta-blocker has been held. Continue statin and aspirin.    Thrombocytopenia ([place]) In the setting of liver cirrhosis. Monitor platelet count.    Macrocytic anemia Already following with hematology. Monitor H&H.    Type 2 diabetes mellitus with hyperglycemia  (HCC) Carbohydrate modified diet. Tresiba or formulary equivalent 40 units at bedtime. Tresiba or formulary equivalent 70 units daily. CBG monitoring with RI SS.    Class  2 obesity Continue lifestyle modifications.   DVT prophylaxis: SCDs. Code Status:   Full code. Family Communication: Disposition Plan:   Patient is from:  Home.  Anticipated DC to:  Home.  Anticipated DC date:  12/10/2020.  Anticipated DC barriers: Clinical status. Consults called: Admission status:  Observation/telemetry.  Severity of Illness:  High due to hypotension with AKI on the patient with a history of liver cirrhosis which puts him a risk of hepatorenal syndrome.  The patient will have to stay at least overnight for IV hydration and close blood pressure monitoring.  Reubin Milan MD [facility]  How to contact the [facility] Attending or Consulting provider [place] or covering provider during after hours [place], for this patient?   1. Check the care team in [facility] and look for a) attending/consulting [facility] provider listed and b) the [facility] team listed 2. Log into [email or web address] and use [place]'s universal password to access. If you do not have the password, please contact the hospital operator. 3. Locate the [facility] provider you are looking for under [facility] and page to a number that you can be directly reached. 4. If you still have difficulty reaching the provider, please page the [facility] (Director on Call) for the Hospitalists listed on amion for assistance.  12/09/2020, 9:24 PM   This document was prepared using Dragon voice recognition software and may contain some unintended transcription errors.

## 2020-12-09 NOTE — ED Notes (Signed)
Chest xray done.

## 2020-12-09 NOTE — ED Provider Notes (Addendum)
[facility] EMERGENCY DEPARTMENT Provider Note   CSN: [id number] Arrival date & time: 12/09/20  1656     History No chief complaint on file.   [name] is a 59 y.o. male history significant for CAD/CABG, CKD, cirrhosis, CHF, OSA, hypertension, diabetes, thrombocytopenia, hyperlipidemia.  History obtained from patient: Patient reports that he was at the cancer center today discussing blood works pertaining to his recent diagnosis of thrombocytopenia.  He reports that he does not have cancer.  He reports that since yesterday he has been feeling generally fatigued but cannot give a clear answer to what he has been feeling.  He reports that anytime he has tried to get up from a sitting or lying position since yesterday he has felt lightheaded and off balance and has had to use a wall to catch himself from falling.  He denies this has been a problem for him in the past.  He reports that yesterday he started taking carvedilol 6.25 mg.  Denies recent illness, denies fever/chills, vision changes, headache, neck stiffness, cough, chest pain/shortness of breath, abdominal pain, nausea/vomiting, diarrhea, dysuria/hematuria, extremity swelling/color change, unilateral weakness, difficulty speaking or any additional concerns.  HPI     Past Medical History:  Diagnosis Date  . Anemia   . Arthritis   . CAD (coronary artery disease)    Multivessel disease status post CABG 08/2015  . Cirrhosis ([place])   . CKD (chronic kidney disease) stage 3, GFR 30-59 ml/min (HCC)   . Diastolic CHF ([place])   . Essential hypertension   . Hyperlipidemia   . OSA on CPAP   . Polyclonal gammopathy   . Thrombocytopenia ([place]) 2016  . Type 2 diabetes mellitus [facility])     Patient Active Problem List   Diagnosis Date Noted  . GERD (gastroesophageal reflux disease) 10/09/2020  . Anasarca   . Cirrhosis of liver ([place])   . Acute on chronic heart failure with preserved ejection fraction ([place])   . Liver failure  ([employer]) 06/20/2020  . Esophageal varices ([place]) 01/09/2020  . History of adenomatous polyp of colon 01/09/2020  . Elevated AST (SGOT) 08/23/2019  . Diarrhea 07/17/2019  . Hypersomnia, persistent 03/12/2019  . Anemia 01/18/2019  . Other pancytopenia ([place]) 01/18/2019  . Thrombocytopenia ([place]) 12/30/2018  . Polyclonal gammopathy 12/30/2018  . S/P total knee replacement, left 03/09/17 11/22/2017  . Primary osteoarthritis of left knee 03/09/2017  . History of nocturia 03/02/2017  . OSA on CPAP 03/02/2016  . Hypoxia, sleep related 03/02/2016  . Cyst of mediastinum 01/15/2016  . S/P CABG x 4 08/29/2015  . CAD (coronary artery disease)   . Obesity, Class III, BMI 40-49.9 (morbid obesity) ([place])   . Hyperglycemia   . Pain in the chest 08/23/2015  . Non-ST elevation MI (NSTEMI) ([place]) 08/23/2015  . Diabetes mellitus type 2, uncontrolled ([place]) 08/23/2015  . Hypertension 08/23/2015  . Hyperlipidemia 08/23/2015  . Osteoarthritis, knee 05/24/2012  . Knee pain 10/29/2011  . Knee stiffness 10/29/2011  . S/P right knee arthroscopy 10/26/2011  . Medial meniscus, posterior horn derangement 09/07/2011  . Lateral meniscus derangement 09/07/2011  . OA (osteoarthritis) of knee 09/07/2011  . Rotator cuff syndrome of left shoulder 08/19/2011  . Right knee meniscal tear 08/19/2011    Past Surgical History:  Procedure Laterality Date  . APPENDECTOMY    . Biceps tendon surgery Right   . BIOPSY  11/22/2019   Procedure: BIOPSY;  Surgeon: Daneil Dolin, MD;  Location[facility] ENDO [facility];  Service: Endoscopy;;  .  CARDIAC CATHETERIZATION N/A 08/25/2015   Procedure: Left Heart Cath and Coronary Angiography;  Surgeon: Belva Crome, MD;  Location: [place] CV LAB;  Service: Cardiovascular;  Laterality: N/A;  . COLONOSCOPY WITH PROPOFOL N/A 11/22/2019   Procedure: COLONOSCOPY WITH PROPOFOL;  Surgeon: Daneil Dolin, MD; Four 4-5 mm polyps, findings suggestive of portal colopathy, congested appearing colonic  mucosa diffusely, rectal varices, and adequate right colon prep.  Pathology with tubular adenomas and hyperplastic polyp.  Right colon biopsy with focal active colitis.  Recommendations to repeat colonoscopy in 3 months due to poor prep.  . COLONOSCOPY WITH PROPOFOL N/A 03/17/2020   Procedure: COLONOSCOPY WITH PROPOFOL;  Surgeon: Daneil Dolin, MD;  Location: [facility] ENDO SUITE;  Service: Endoscopy;  Laterality: N/A;  8:45am - pt does not need covid test, was + 2/4 <90 days  . CORONARY ARTERY BYPASS GRAFT N/A 08/29/2015   Procedure: CORONARY ARTERY BYPASS GRAFTING (CABG);  Surgeon: [name], MD;  Location: [place];  Service: Open Heart Surgery;  Laterality: N/A;  . ESOPHAGOGASTRODUODENOSCOPY (EGD) WITH PROPOFOL N/A 11/22/2019   Procedure: ESOPHAGOGASTRODUODENOSCOPY (EGD) WITH PROPOFOL;  Surgeon: Daneil Dolin, MD; 4 columns of grade 2-3 esophageal varices, portal gastropathy, gastric polyp/abnormal gastric mucosa s/p biopsy.  Pathology with hyperplastic polyp, mild chronic gastritis, negative H. pylori.  [name] KNEE ARTHROSCOPY Left   . TEE WITHOUT CARDIOVERSION N/A 08/29/2015   Procedure: TRANSESOPHAGEAL ECHOCARDIOGRAM (TEE);  Surgeon: [name], MD;  Location: [place];  Service: Open Heart Surgery;  Laterality: N/A;  . TOTAL KNEE ARTHROPLASTY Left 03/09/2017   Procedure: TOTAL KNEE ARTHROPLASTY;  Surgeon: Carole Civil, MD;  Location: [facility] ORS;  Service: Orthopedics;  Laterality: Left;       Family History  Problem Relation Age of Onset  . Arthritis Other   . Cancer Other   . Diabetes Other   . CAD Father   . Diabetes Mellitus II Father   . Liver cancer Father   . Hodgkin's lymphoma Father   . CAD Brother   . Diabetes Mellitus II Brother   . ALS Mother   . Diabetes Mellitus II Sister   . Diabetes Mellitus II Brother   . Diabetes Mellitus II Maternal Grandmother   . Aneurysm Maternal Grandmother   . Cancer Maternal Grandfather   . Anesthesia problems Neg Hx   .  Hypotension Neg Hx   . Malignant hyperthermia Neg Hx   . Pseudochol deficiency Neg Hx   . Colon cancer Neg Hx     Social History   Tobacco Use  . Smoking status: Never Smoker  . Smokeless tobacco: Never Used  Vaping Use  . Vaping Use: Never used  Substance Use Topics  . Alcohol use: No    Alcohol/week: 0.0 standard drinks  . Drug use: No    Home Medications Prior to Admission medications   Medication Sig Start Date End Date Taking? Authorizing Provider  albuterol (VENTOLIN HFA) 108 (90 Base) MCG/ACT inhaler Inhale 2 puffs into the lungs every 6 (six) hours as needed for wheezing or shortness of breath. 06/11/19  Yes Padgett, Rae Halsted, MD  ALPRAZolam Duanne Moron) 0.5 MG tablet Take 0.5 mg by mouth daily as needed. 10/03/20  Yes [provider]  Armodafinil 200 MG TABS Take 200 mg by mouth daily as needed (for sleepiness). 11/13/20  Yes Dohmeier, Asencion Partridge, MD  aspirin EC 81 MG tablet Take 81 mg by mouth at bedtime.    Yes [provider]  Cholecalciferol (DIALYVITE VITAMIN  D 5000) 125 MCG (5000 UT) capsule Take 5,000 Units by mouth in the morning.    Yes [provider]  insulin aspart (NOVOLOG) 100 UNIT/ML injection Inject 5-30 Units into the skin 3 (three) times daily before meals.    Yes [provider]  lactulose (CHRONULAC) 10 GM/15ML solution Take 30 mLs (20 g total) by mouth daily. 07/02/20  Yes Barton Dubois, MD  loratadine (CLARITIN) 10 MG tablet Take 10 mg by mouth in the morning.    Yes [provider]  omeprazole (PRILOSEC) 40 MG capsule Take 40 mg by mouth at bedtime. 09/13/18  Yes [provider]  OZEMPIC, 1 MG/DOSE, 4 MG/3ML SOPN Inject 1 mg into the skin once a week. 11/24/20  Yes [provider]  pravastatin (PRAVACHOL) 20 MG tablet Take 20 mg by mouth daily.   Yes [provider]  spironolactone (ALDACTONE) 100 MG tablet Take 0.5 tablets (50 mg total) by mouth daily. 08/15/20  Yes Satira Sark,  MD  torsemide (DEMADEX) 20 MG tablet Take 60 mg by mouth daily.   Yes [provider]  TRESIBA FLEXTOUCH 200 UNIT/ML SOPN Inject 40-70 Units into the skin See admin instructions. Inject 70 units into the skin in the morning and 40 units at bedtime 01/02/18  Yes [provider]  TURMERIC CURCUMIN PO Take 2,000 mg by mouth at bedtime.    Yes [provider]  carvedilol (COREG) 3.125 MG tablet TAKE 1 TABLET(3.125 MG) BY MOUTH TWICE DAILY WITH A MEAL 12/09/20   Derek Jack, MD  Continuous Blood Gluc Receiver (FREESTYLE LIBRE 14 DAY READER) [place] 1 reader    [provider]  Continuous Blood Gluc Sensor (FREESTYLE LIBRE 14 DAY SENSOR) MISC 1 sensor 12/28/17   [provider]  Elastic Bandages & Supports ([place]) [place] 1 each by Does not apply route as directed. Low pressure knee high compression stockings Dx: leg swelling 05/13/20   Satira Sark, MD  ELDERBERRY PO Take by mouth at bedtime. With  Vitamin c and zinc    [provider]  ferrous sulfate 325 (65 FE) MG tablet Take 325 mg by mouth in the morning.     [provider]  Flaxseed, Linseed, (FLAXSEED OIL PO) Take 1,400 mg by mouth at bedtime.     [provider]  INS SYRINGE/NEEDLE .5CC/28G 28G X 1/2" 0.5 ML MISC See admin instructions.    [provider]  Magnesium 250 MG TABS Take 250 mg by mouth daily.    [provider]    Allergies    Fluticasone  Review of Systems   Review of Systems Ten systems are reviewed and are negative for acute change except as noted in the HPI  Physical Exam Updated Vital Signs BP (!) 90/55   Pulse 83   Temp 98.3 F (36.8 C) (Oral)   Resp 19   SpO2 98%   Physical Exam Constitutional:      General: He is not in acute distress.    Appearance: Normal appearance. He is well-developed. He is not ill-appearing or diaphoretic.  HENT:     Head: Normocephalic and atraumatic.  Eyes:      General: Vision grossly intact. Gaze aligned appropriately.     Pupils: Pupils are equal, round, and reactive to light.  Neck:     Trachea: Trachea and phonation normal.  Pulmonary:     Effort: Pulmonary effort is normal. No respiratory distress.  Abdominal:     General:  There is no distension.     Palpations: Abdomen is soft.     Tenderness: There is no abdominal tenderness. There is no guarding or rebound.  Musculoskeletal:        General: Normal range of motion.     Cervical back: Normal range of motion.  Skin:    General: Skin is warm and dry.  Neurological:     Mental Status: He is alert.     GCS: GCS eye subscore is 4. GCS verbal subscore is 5. GCS motor subscore is 6.     Comments: Speech is clear and goal oriented, follows commands Major Cranial nerves without deficit, no facial droop Normal strength in upper and lower extremities bilaterally including dorsiflexion and plantar flexion, strong and equal grip strength Sensation normal to light and sharp touch Moves extremities without ataxia, coordination intact Normal finger to nose and rapid alternating movements No pronator drift  Psychiatric:        Behavior: Behavior normal.     ED Results / Procedures / Treatments   Labs (all labs ordered are listed, but only abnormal results are displayed) Labs Reviewed  CBC WITH DIFFERENTIAL/PLATELET - Abnormal; Notable for the following components:      Result Value   WBC 11.4 (*)    RBC 3.53 (*)    Hemoglobin 12.1 (*)    HCT 35.8 (*)    MCV 101.4 (*)    MCH 34.3 (*)    Platelets 73 (*)    Neutro Abs 8.2 (*)    Monocytes Absolute 1.1 (*)    Abs Immature Granulocytes 0.16 (*)    All other components within normal limits  BASIC METABOLIC PANEL - Abnormal; Notable for the following components:   Sodium 125 (*)    Chloride 92 (*)    CO2 20 (*)    Glucose, Bld 189 (*)    BUN 77 (*)    Creatinine, Ser 4.00 (*)    Calcium 8.1 (*)    GFR, Estimated 16 (*)    All other  components within normal limits  HEPATIC FUNCTION PANEL - Abnormal; Notable for the following components:   Total Protein 6.2 (*)    Albumin 3.1 (*)    Indirect Bilirubin 1.0 (*)    All other components within normal limits  CBG MONITORING, ED - Abnormal; Notable for the following components:   Glucose-Capillary 164 (*)    All other components within normal limits  RESP PANEL BY RT-PCR (FLU A&B, COVID) [id number]  LACTIC ACID, PLASMA  URINALYSIS, ROUTINE W REFLEX MICROSCOPIC  LACTIC ACID, PLASMA  AMMONIA  TROPONIN I (HIGH SENSITIVITY)  TROPONIN I (HIGH SENSITIVITY)    EKG EKG Interpretation  Date/Time:  Tuesday December 09 2020 17:07:48 EST Ventricular Rate:  83 PR Interval:    QRS Duration: 96 QT Interval:  381 QTC Calculation: 448 R Axis:   79 Text Interpretation: Sinus rhythm Low voltage with right axis deviation No significant change since prior 9/21 Confirmed by Aletta Edouard [id number]) on 12/09/2020 5:51:06 PM   Radiology DG Chest Portable 1 View  Result Date: 12/09/2020 CLINICAL DATA:  Hypotension EXAM: PORTABLE CHEST 1 VIEW COMPARISON:  10/28/2020 FINDINGS: Single frontal view of the chest demonstrates a stable cardiac silhouette. Postsurgical changes from median sternotomy. No airspace disease, effusion, or pneumothorax. No acute bony abnormalities. IMPRESSION: 1. Stable exam, no acute process. Electronically Signed   By: Randa Ngo M.D.   On: 12/09/2020 17:41    Procedures .Critical Care Performed by: Athena Masse,  Camillia Herter, PA-C Authorized by: Deliah Boston, PA-C   Critical care provider statement:    Critical care time (minutes):  35   Critical care was necessary to treat or prevent imminent or life-threatening deterioration of the following conditions: Hypotension, AKI.   Critical care was time spent personally by me on the following activities:  Discussions with consultants, evaluation of patient's response to treatment, examination of patient, ordering and  performing treatments and interventions, ordering and review of laboratory studies, ordering and review of radiographic studies, pulse oximetry, re-evaluation of patient's condition, obtaining history from patient or surrogate, review of old charts and development of treatment plan with patient or surrogate   (including critical care time)  Medications Ordered in ED Medications  sodium chloride 0.9 % bolus 1,000 mL (0 mLs Intravenous Stopped 12/09/20 1925)  sodium chloride 0.9 % bolus 1,000 mL (1,000 mLs Intravenous New Bag/Given 12/09/20 1931)    ED Course  I have reviewed the triage vital signs and the nursing notes.  Pertinent labs & imaging results that were available during my care of the patient were reviewed by me and considered in my medical decision making (see chart for details).  Clinical Course as of 12/09/20 1946  Tue Dec 09, 2254  4411 59 year old male with history of thrombocytopenia recently started on carvedilol here feeling lightheaded and unsteady since yesterday.  Was at the [facility] today for evaluation and found to be hypotensive.  Denies any recent illnesses.  Getting fluids labs EKG.  Disposition per results of testing. [MB]  1913 ED ECG REPORT   Date: @EDTODAY @  Rate: 83  Rhythm: normal sinus rhythm  QRS Axis: right axis  Intervals: normal  ST/T Wave abnormalities: normal  Conduction Disutrbances:none  Narrative Interpretation:   Old EKG Reviewed: unchanged  I have personally reviewed the EKG tracing and agree with the computerized printout as noted.  [MB]    Clinical Course User Index [MB] Hayden Rasmussen, MD   MDM Rules/Calculators/A&P                         Additional history obtained from: 1. Nursing notes from this visit. 2. Review of electronic medical records.  Patient has a brief note from cancer center office visit today.  Diagnosis thrombocytopenia.  On the note it appears patient stumbled and nearly fell was walking in a zigzag  pattern and was instructed to go to the emergency department today.  It appears that Dr. Raliegh Ip has decreased patient's dose of carvedilol and has advised patient not to take Benicar or metoprolol. 3. Patient seen at [facility] liver clinic 12/02/20.  Appears she was prescribed per day below 6.25 mg at that visit.  It appears patient has not had a liver transplant and was not a candidate. ------------------------- I ordered, reviewed and interpreted labs which include: CBC shows mild leukocytosis of 11.4, mild anemia of 12.1, baseline thrombocytopenia of 73. Covid/influenza panel negative. Lactic within normal limits Urinalysis within normal limits, no evidence of infection LFTs show no acute elevations High-sensitivity troponin within normal limits CBG 164 BMP shows hyponatremia 125 similar to prior, bicarb 20 normal gap.  Creatinine BUN elevated - AKI. EKG reviewed by Dr. Melina Copa without emergent findings. Ammonia pending  CXR:  IMPRESSION:  1. Stable exam, no acute process.  - 7:25 PM: Patient reassessed he is resting comfortably in bed no acute distress reports that he is feeling better.  Repeat blood pressure improving systolic in the  28s.  Suspect patient's AKI may be secondary to hypoperfusion of the kidneys.  Will continue to give IV fluids.  Will consult hospitalist service for admission.  Possible patient's hypotension secondary to carvedilol, he will need continued monitoring, no indication for antibiotics at this time low suspicion for SIRS/sepsis.  Patient seen and evaluated by Dr. Melina Copa during this visit who agrees with admission. - 7:45 PM: Consulted Dr. Olevia Bowens, patient accepted to hospitalist service  Note: Portions of this report may have been transcribed using voice recognition software. Every effort was made to ensure accuracy; however, inadvertent computerized transcription errors may still be present. Final Clinical Impression(s) / ED Diagnoses Final diagnoses:   Hypotension, unspecified hypotension type  AKI (acute kidney injury) [facility])    Rx / DC Orders ED Discharge Orders    None       Gari Crown 12/09/20 1946    Gari Crown 12/09/20 1954    Hayden Rasmussen, MD 12/10/20 1255

## 2020-12-10 ENCOUNTER — Other Ambulatory Visit: Payer: Self-pay

## 2020-12-10 ENCOUNTER — Encounter (HOSPITAL_COMMUNITY): Payer: Self-pay | Admitting: Internal Medicine

## 2020-12-10 DIAGNOSIS — N1832 Chronic kidney disease, stage 3b: Secondary | ICD-10-CM

## 2020-12-10 DIAGNOSIS — E785 Hyperlipidemia, unspecified: Secondary | ICD-10-CM | POA: Diagnosis not present

## 2020-12-10 DIAGNOSIS — E871 Hypo-osmolality and hyponatremia: Secondary | ICD-10-CM | POA: Diagnosis not present

## 2020-12-10 DIAGNOSIS — I959 Hypotension, unspecified: Secondary | ICD-10-CM | POA: Diagnosis not present

## 2020-12-10 DIAGNOSIS — Z888 Allergy status to other drugs, medicaments and biological substances status: Secondary | ICD-10-CM | POA: Diagnosis not present

## 2020-12-10 DIAGNOSIS — N179 Acute kidney failure, unspecified: Secondary | ICD-10-CM | POA: Diagnosis not present

## 2020-12-10 DIAGNOSIS — I5032 Chronic diastolic (congestive) heart failure: Secondary | ICD-10-CM | POA: Diagnosis not present

## 2020-12-10 DIAGNOSIS — Z96652 Presence of left artificial knee joint: Secondary | ICD-10-CM | POA: Diagnosis present

## 2020-12-10 DIAGNOSIS — E1129 Type 2 diabetes mellitus with other diabetic kidney complication: Secondary | ICD-10-CM | POA: Diagnosis not present

## 2020-12-10 DIAGNOSIS — D649 Anemia, unspecified: Secondary | ICD-10-CM | POA: Diagnosis not present

## 2020-12-10 DIAGNOSIS — D696 Thrombocytopenia, unspecified: Secondary | ICD-10-CM

## 2020-12-10 DIAGNOSIS — Z20822 Contact with and (suspected) exposure to covid-19: Secondary | ICD-10-CM | POA: Diagnosis present

## 2020-12-10 DIAGNOSIS — K7031 Alcoholic cirrhosis of liver with ascites: Secondary | ICD-10-CM

## 2020-12-10 DIAGNOSIS — R262 Difficulty in walking, not elsewhere classified: Secondary | ICD-10-CM | POA: Diagnosis not present

## 2020-12-10 DIAGNOSIS — E669 Obesity, unspecified: Secondary | ICD-10-CM | POA: Diagnosis not present

## 2020-12-10 DIAGNOSIS — K760 Fatty (change of) liver, not elsewhere classified: Secondary | ICD-10-CM | POA: Diagnosis present

## 2020-12-10 DIAGNOSIS — I10 Essential (primary) hypertension: Secondary | ICD-10-CM | POA: Diagnosis not present

## 2020-12-10 DIAGNOSIS — E1165 Type 2 diabetes mellitus with hyperglycemia: Secondary | ICD-10-CM

## 2020-12-10 DIAGNOSIS — K7581 Nonalcoholic steatohepatitis (NASH): Secondary | ICD-10-CM | POA: Diagnosis not present

## 2020-12-10 DIAGNOSIS — E869 Volume depletion, unspecified: Secondary | ICD-10-CM | POA: Diagnosis present

## 2020-12-10 DIAGNOSIS — Z800 Family history of malignant neoplasm of digestive organs: Secondary | ICD-10-CM | POA: Diagnosis not present

## 2020-12-10 DIAGNOSIS — Z951 Presence of aortocoronary bypass graft: Secondary | ICD-10-CM | POA: Diagnosis not present

## 2020-12-10 DIAGNOSIS — I130 Hypertensive heart and chronic kidney disease with heart failure and stage 1 through stage 4 chronic kidney disease, or unspecified chronic kidney disease: Secondary | ICD-10-CM | POA: Diagnosis not present

## 2020-12-10 DIAGNOSIS — E11649 Type 2 diabetes mellitus with hypoglycemia without coma: Secondary | ICD-10-CM | POA: Diagnosis not present

## 2020-12-10 DIAGNOSIS — I9589 Other hypotension: Secondary | ICD-10-CM | POA: Diagnosis not present

## 2020-12-10 DIAGNOSIS — K7460 Unspecified cirrhosis of liver: Secondary | ICD-10-CM | POA: Diagnosis not present

## 2020-12-10 DIAGNOSIS — I952 Hypotension due to drugs: Secondary | ICD-10-CM | POA: Diagnosis not present

## 2020-12-10 DIAGNOSIS — Z8249 Family history of ischemic heart disease and other diseases of the circulatory system: Secondary | ICD-10-CM | POA: Diagnosis not present

## 2020-12-10 DIAGNOSIS — G4733 Obstructive sleep apnea (adult) (pediatric): Secondary | ICD-10-CM | POA: Diagnosis not present

## 2020-12-10 DIAGNOSIS — Z833 Family history of diabetes mellitus: Secondary | ICD-10-CM | POA: Diagnosis not present

## 2020-12-10 DIAGNOSIS — I950 Idiopathic hypotension: Secondary | ICD-10-CM

## 2020-12-10 DIAGNOSIS — E6609 Other obesity due to excess calories: Secondary | ICD-10-CM | POA: Diagnosis not present

## 2020-12-10 DIAGNOSIS — E1122 Type 2 diabetes mellitus with diabetic chronic kidney disease: Secondary | ICD-10-CM | POA: Diagnosis present

## 2020-12-10 DIAGNOSIS — M6281 Muscle weakness (generalized): Secondary | ICD-10-CM | POA: Diagnosis not present

## 2020-12-10 DIAGNOSIS — X58XXXA Exposure to other specified factors, initial encounter: Secondary | ICD-10-CM | POA: Diagnosis present

## 2020-12-10 DIAGNOSIS — D6959 Other secondary thrombocytopenia: Secondary | ICD-10-CM | POA: Diagnosis present

## 2020-12-10 DIAGNOSIS — Z807 Family history of other malignant neoplasms of lymphoid, hematopoietic and related tissues: Secondary | ICD-10-CM | POA: Diagnosis not present

## 2020-12-10 DIAGNOSIS — I2510 Atherosclerotic heart disease of native coronary artery without angina pectoris: Secondary | ICD-10-CM | POA: Diagnosis not present

## 2020-12-10 LAB — CBC
HCT: 36.8 % — ABNORMAL LOW (ref 39.0–52.0)
Hemoglobin: 12.5 g/dL — ABNORMAL LOW (ref 13.0–17.0)
MCH: 34.3 pg — ABNORMAL HIGH (ref 26.0–34.0)
MCHC: 34 g/dL (ref 30.0–36.0)
MCV: 101.1 fL — ABNORMAL HIGH (ref 80.0–100.0)
Platelets: 52 10*3/uL — ABNORMAL LOW (ref 150–400)
RBC: 3.64 MIL/uL — ABNORMAL LOW (ref 4.22–5.81)
RDW: 14.6 % (ref 11.5–15.5)
WBC: 6.9 10*3/uL (ref 4.0–10.5)
nRBC: 0 % (ref 0.0–0.2)

## 2020-12-10 LAB — COMPREHENSIVE METABOLIC PANEL
ALT: 18 U/L (ref 0–44)
AST: 24 U/L (ref 15–41)
Albumin: 3.4 g/dL — ABNORMAL LOW (ref 3.5–5.0)
Alkaline Phosphatase: 47 U/L (ref 38–126)
Anion gap: 11 (ref 5–15)
BUN: 70 mg/dL — ABNORMAL HIGH (ref 8–23)
CO2: 21 mmol/L — ABNORMAL LOW (ref 22–32)
Calcium: 8.2 mg/dL — ABNORMAL LOW (ref 8.9–10.3)
Chloride: 101 mmol/L (ref 98–111)
Creatinine, Ser: 3.09 mg/dL — ABNORMAL HIGH (ref 0.61–1.24)
GFR, Estimated: 22 mL/min — ABNORMAL LOW (ref >60)
Glucose, Bld: 85 mg/dL (ref 70–99)
Potassium: 4.5 mmol/L (ref 3.5–5.1)
Sodium: 133 mmol/L — ABNORMAL LOW (ref 135–145)
Total Bilirubin: 1.1 mg/dL (ref 0.3–1.2)
Total Protein: 6.7 g/dL (ref 6.5–8.1)

## 2020-12-10 LAB — HEMOGLOBIN A1C
Hgb A1c MFr Bld: 7.9 % — ABNORMAL HIGH (ref 4.8–5.6)
Mean Plasma Glucose: 180.03 mg/dL

## 2020-12-10 LAB — GLUCOSE, CAPILLARY
Glucose-Capillary: 114 mg/dL — ABNORMAL HIGH (ref 70–99)
Glucose-Capillary: 138 mg/dL — ABNORMAL HIGH (ref 70–99)
Glucose-Capillary: 44 mg/dL — CL (ref 70–99)
Glucose-Capillary: 96 mg/dL (ref 70–99)
Glucose-Capillary: 99 mg/dL (ref 70–99)
Glucose-Capillary: 99 mg/dL (ref 70–99)

## 2020-12-10 LAB — MRSA PCR SCREENING: MRSA by PCR: NEGATIVE

## 2020-12-10 LAB — PROCALCITONIN: Procalcitonin: 0.11 ng/mL

## 2020-12-10 MED ORDER — OCTREOTIDE ACETATE 500 MCG/ML IJ SOLN
INTRAMUSCULAR | Status: AC
  Filled 2020-12-10: qty 1

## 2020-12-10 MED ORDER — DEXTROSE 50 % IV SOLN
INTRAVENOUS | Status: AC
  Administered 2020-12-10: 50 mL
  Filled 2020-12-10: qty 50

## 2020-12-10 MED ORDER — INSULIN GLARGINE 100 UNIT/ML ~~LOC~~ SOLN
30.0000 [IU] | Freq: Every day | SUBCUTANEOUS | Status: DC
  Filled 2020-12-10 (×2): qty 0.3

## 2020-12-10 MED ORDER — INSULIN GLARGINE 100 UNIT/ML ~~LOC~~ SOLN
20.0000 [IU] | Freq: Every day | SUBCUTANEOUS | Status: DC
  Filled 2020-12-10 (×2): qty 0.2

## 2020-12-10 MED ORDER — CHLORHEXIDINE GLUCONATE CLOTH 2 % EX PADS
6.0000 | MEDICATED_PAD | Freq: Every day | CUTANEOUS | Status: DC
  Administered 2020-12-10 – 2020-12-16 (×7): 6 via TOPICAL

## 2020-12-10 NOTE — TOC Initial Note (Signed)
Transition of Care [facility]) - Initial/Assessment Note    Patient Details  Name: [name] MRN: [id number] Date of Birth: December 18, 1960  Transition of Care [facility]) CM/SW Contact:    Salome Arnt, [place] Phone Number: 12/10/2020, 4:27 PM  Clinical Narrative:  Pt admitted due to hypotension with AKI. LCSW completed assessment with pt's wife. Per wife, pt is unsteady on his feet at baseline and refuses to use walker. Discussed PT recommendation for home health and she is agreeable. Pt's wife states pt had home health in the past with [facility], however they are unable to accept now due to insurance. She would prefer home health, but is open to outpatient PT as well if needed if no other home health will accept. She requests shower chair at d/c.                  Expected Discharge Plan: [place] Barriers to Discharge: Continued Medical Work up   Patient Goals and CMS Choice Patient states their goals for this hospitalization and ongoing recovery are:: return home   Choice offered to / list presented to : Spouse  Expected Discharge Plan and Services Expected Discharge Plan: [place] In-house Referral: Clinical Social Work   Post Acute Care Choice: [place] arrangements for the past 2 months: [place]                                      Prior Living Arrangements/Services Living arrangements for the past 2 months: Single Family Home Lives with:: Spouse Patient language and need for interpreter reviewed:: Yes Do you feel safe going back to the place where you live?: Yes      Need for Family Participation in Patient Care: Yes (Comment) Care giver support system in place?: Yes (comment) Current home services: DME (walker, cane, potty chair) Criminal Activity/Legal Involvement Pertinent to Current Situation/Hospitalization: No - Comment as needed  Activities of Daily Living Home Assistive Devices/Equipment:  CBG Meter,Cane (specify quad or straight),Walker (specify type),Blood pressure cuff ADL Screening (condition at time of admission) Patient's cognitive ability adequate to safely complete daily activities?: Yes Is the patient deaf or have difficulty hearing?: No Does the patient have difficulty seeing, even when wearing glasses/contacts?: No Does the patient have difficulty concentrating, remembering, or making decisions?: No Patient able to express need for assistance with ADLs?: Yes Does the patient have difficulty dressing or bathing?: No Independently performs ADLs?: Yes (appropriate for developmental age) Does the patient have difficulty walking or climbing stairs?: Yes Weakness of Legs: None Weakness of Arms/Hands: None  Permission Sought/Granted                  Emotional Assessment         Alcohol / Substance Use: Not Applicable Psych Involvement: No (comment)  Admission diagnosis:  AKI (acute kidney injury) ([place]) [N17.9] Hypotension, unspecified hypotension type [I95.9] Acute renal failure superimposed on stage 3b chronic kidney disease, unspecified acute renal failure type ([place]) [N17.9, N18.32] Patient Active Problem List   Diagnosis Date Noted  . Hypotension 12/10/2020  . Acute renal failure superimposed on stage 3b chronic kidney disease ([place]) 12/10/2020  . Acute renal failure superimposed on stage 3b chronic kidney disease, unspecified acute renal failure type ([place]) 12/10/2020  . AKI (acute kidney injury) ([place]) 12/09/2020  . GERD (gastroesophageal reflux disease) 10/09/2020  . Anasarca   .  Cirrhosis of liver ([place])   . Acute on chronic heart failure with preserved ejection fraction ([place])   . Liver failure ([place]) 06/20/2020  . Esophageal varices ([place]) 01/09/2020  . History of adenomatous polyp of colon 01/09/2020  . Elevated AST (SGOT) 08/23/2019  . Diarrhea 07/17/2019  . Hypersomnia, persistent 03/12/2019  . Macrocytic anemia 01/18/2019  . Other pancytopenia  ([place]) 01/18/2019  . Thrombocytopenia ([place]) 12/30/2018  . Polyclonal gammopathy 12/30/2018  . S/P total knee replacement, left 03/09/17 11/22/2017  . Primary osteoarthritis of left knee 03/09/2017  . History of nocturia 03/02/2017  . OSA on CPAP 03/02/2016  . Hypoxia, sleep related 03/02/2016  . Cyst of mediastinum 01/15/2016  . S/P CABG x 4 08/29/2015  . CAD (coronary artery disease)   . Obesity, Class III, BMI 40-49.9 (morbid obesity) ([place])   . Hyperglycemia   . Pain in the chest 08/23/2015  . Non-ST elevation MI (NSTEMI) ([place]) 08/23/2015  . Diabetes mellitus type 2, uncontrolled ([place]) 08/23/2015  . Hypertension 08/23/2015  . Hyperlipidemia 08/23/2015  . Type 2 diabetes mellitus with hyperglycemia ([place]) 2016  . Class 2 obesity 2016  . Hyponatremia 2016  . Osteoarthritis, knee 05/24/2012  . Knee pain 10/29/2011  . Knee stiffness 10/29/2011  . S/P right knee arthroscopy 10/26/2011  . Medial meniscus, posterior horn derangement 09/07/2011  . Lateral meniscus derangement 09/07/2011  . OA (osteoarthritis) of knee 09/07/2011  . Rotator cuff syndrome of left shoulder 08/19/2011  . Right knee meniscal tear 08/19/2011   PCP:  [name], MD Pharmacy:   [facility], [address] [place] [address] Phone: [phone] Fax: [facility], [place] [address] AT [address] [place] [address] Phone: [phone] Fax: [phone]     Social Determinants of Health (SDOH) Interventions    Readmission Risk Interventions Readmission Risk Prevention Plan 12/10/2020  Transportation Screening Complete  HRI or Home Care Consult Complete  Social Work Consult for [place] Planning/Counseling Complete  Palliative Care Screening Not Applicable  Medication Review Press photographer) Complete  Some recent data might be hidden

## 2020-12-10 NOTE — TOC Progression Note (Addendum)
Transition of Care [facility]) - Progression Note    Patient Details  Name: [name] MRN: [id number] Date of Birth: March 26, 1961  Transition of Care [facility]) CM/SW [place], [place] Phone Number: 12/10/2020, 5:57 PM  Clinical Narrative:    CSW reached out to multiple [facility] agencies including [facility], [place], [place], [place], [facility], and [place]. No companies were able to accept pt. CSW spoke with pts wife [name] to update on difficulty finding [place]. [name] is understanding and accepting of referral being made to outpatient PT. CSW made referral and added to pts AVS.   Ms. Orrego also states that she would appreciate pts medical records from hospital stay be sent to both [facility] and [facility] [place] [facility] as pt is on list for a liver transplant. TOC to follow.   Expected Discharge Plan: [place] Barriers to Discharge: Continued Medical Work up  Expected Discharge Plan and Services Expected Discharge Plan: [place] In-house Referral: Clinical Social Work   Post Acute Care Choice: [place] arrangements for the past 2 months: Single Family Home                                       Social Determinants of Health (SDOH) Interventions    Readmission Risk Interventions Readmission Risk Prevention Plan 12/10/2020  Transportation Screening Complete  HRI or Home Care Consult Complete  Social Work Consult for [place] Planning/Counseling Complete  Palliative Care Screening Not Applicable  Medication Review Press photographer) Complete  Some recent data might be hidden

## 2020-12-10 NOTE — Plan of Care (Signed)
  Problem: Acute Rehab PT Goals(only PT should resolve) Goal: Pt Will Go Supine/Side To Sit Outcome: Progressing Flowsheets (Taken 12/10/2020 1608) Pt will go Supine/Side to Sit: with modified independence Goal: Patient Will Transfer Sit To/From Stand Outcome: Progressing Flowsheets (Taken 12/10/2020 1608) Patient will transfer sit to/from stand: with supervision Goal: Pt Will Transfer Bed To Chair/Chair To Bed Outcome: Progressing Flowsheets (Taken 12/10/2020 1608) Pt will Transfer Bed to Chair/Chair to Bed: with supervision Goal: Pt Will Ambulate Outcome: Progressing Flowsheets (Taken 12/10/2020 1608) Pt will Ambulate:  50 feet  with supervision  with min guard assist  with cane  with rolling walker   4:08 PM, 12/10/20 Lonell Grandchild, MPT Physical Therapist with [facility] [phone] office [phone] mobile phone

## 2020-12-10 NOTE — Progress Notes (Signed)
PROGRESS NOTE  [name] GHW:[id number] DOB: 1961-08-15 DOA: 12/09/2020 PCP: [name], MD  Brief History:  59 year old male with a history of NAFLD/ALD cirrhosis, diabetes mellitus type 2, coronary artery disease, CKD stage III, diastolic CHF, hypertension, IDA presenting with lightheadedness and generalized malaise for approximately 2 days prior to this admission.  The patient stated that he felt groggy and cloudy.  He went to follow-up with his medical oncologist for his thrombocytopenia.  He was noted to have systolic blood pressure in the 60s.  As result, the patient was admitted for further evaluation.  The patient denies any fevers, chills, chest pain, shortness breath, cough, hemoptysis, nausea, vomiting, diarrhea, abdominal pain, dysuria, hematuria.  He did state that he has had decreased urine output for last few days prior to admission.  He denies any headache, neck pain.  The patient followed up with his hepatologist at [facility] on 12/02/2020 at which time he has nadolol was discontinued, and the patient was started on carvedilol 6.25 mg twice daily.  In addition, the patient has been instructed to increase his torsemide to 60 mg twice daily during a cardiology follow-up visit on 11/18/2020.  He was instructed to take it twice daily for 1 week, then decrease back to once daily.  Unfortunately, he continued to take it twice daily.  He was instructed to decrease back to once daily by his hepatologist at [facility] on 12/02/2020.  Other than that, the patient denied any new medications.  He stated that his appetite has been fair.  ED Course: Initial vital signs were temperature 98.3 F, pulse 84, RR 22, BP 60/45 mmHg and O2 sat 94% on room air.  The patient received 2 L of normal saline bolus in the emergency department which resulted in an increase of his systolic blood pressure to to the 90s.  Labwork: His urinalysis was normal.  Lactic acid 1.4 and then 2.1 mmol/L.  Coronavirus  and influenza PCR was negative.  CBC showed a white count of 11.4 with 72% neutrophils, hemoglobin 12.1 g/dL with an MCV of 101.4 fL and platelets 73.  PT 15.6 and INR 1.3.  His sodium was 125, potassium 4.9, chloride 92 and CO2 20 mmol/L.  Glucose 189, BUN 77 creatinine 4.00 mg/dL.  LFTs show total protein of 6.2 and albumin of 3.1 g/dL.  Total bilirubin slightly increased to 1.0 mg/dL.  The rest of the hepatic functions are normal.  Troponin was static and then 8 ng/L.  Ammonia was 18 mol/L.  Assessment/Plan: Hypotension -Likely secondary to volume depletion and recent change in his antihypertensive regimen for his liver cirrhosis -Check procalcitonin -TSH -A.m. cortisol -Continue IV fluids -Discontinue midodrine and octreotide -Holding torsemide which has been decreased back to 60 mg daily -BP remains hypotensive/soft  Acute on chronic renal failure--CKD 3B -Secondary to volume depletion and hemodynamic change -Baseline creatinine 1.6-1.9 -Patient presented with serum creatinine 4.00 -Continue IV fluids  Chronic diastolic CHF -He appears euvolemic clinically -Holding torsemide temporarily secondary to hypotension  Uncontrolled diabetes mellitus type 2 with hyperglycemia -12/10/2020 hemoglobin A1c 7.9 -Reduced dose Lantus in the setting of acute kidney injury -Continue NovoLog sliding scale  NAFLD/ALD Cirrhosis -Holding spironolactone and torsemide temporarily secondary to hypotension and acute kidney injury -Continue lactulose -Patient follows hepatology at [facility]  Coronary artery disease -No chest pain presently -Continue aspirin 81 mg daily -Holding carvedilol secondary to hypotension  Thrombocytopenia -Secondary to liver cirrhosis -Monitor for signs of bleeding -Hemoglobin  remained stable     Status is: Observation  The patient will require care spanning > 2 midnights and should be moved to inpatient because: IV treatments appropriate due to intensity of illness  or inability to take PO  Dispo: The patient is from: Home              Anticipated d/c is to: Home              Anticipated d/c date is: 2 days              Patient currently is not medically stable to d/c.        Family Communication:  no Family at bedside  Consultants:  none  Code Status:  FULL   DVT Prophylaxis:  SCDs   Procedures: As Listed in Progress Note Above  Antibiotics: None       Subjective: Patient denies fevers, chills, headache, chest pain, dyspnea, nausea, vomiting, diarrhea, abdominal pain, dysuria, hematuria, hematochezia, and melena.   Objective: Vitals:   12/10/20 0900 12/10/20 1000 12/10/20 1100 12/10/20 1128  BP: 98/66 (!) 105/59 (!) 108/56   Pulse: 79 84 82   Resp: 16 15 17    Temp:    97.9 F (36.6 C)  TempSrc:    Oral  SpO2: 100% 99% 98%   Weight:      Height:        Intake/Output Summary (Last 24 hours) at 12/10/2020 1235 Last data filed at 12/10/2020 1120 Gross per 24 hour  Intake 3667 ml  Output 2700 ml  Net 967 ml   Weight change:  Exam:   General:  Pt is alert, follows commands appropriately, not in acute distress  HEENT: No icterus, No thrush, No neck mass, [place]/AT  Cardiovascular: RRR, S1/S2, no rubs, no gallops  Respiratory: bibasilar crackles. No wheeze  Abdomen: Soft/+BS, non tender, non distended, no guarding  Extremities: trace LE edema, No lymphangitis, No petechiae, No rashes, no synovitis   Data Reviewed: I have personally reviewed following labs and imaging studies Basic Metabolic Panel: Recent Labs  Lab 12/09/20 1730 12/10/20 0241  NA 125* 133*  K 4.9 4.5  CL 92* 101  CO2 20* 21*  GLUCOSE 189* 85  BUN 77* 70*  CREATININE 4.00* 3.09*  CALCIUM 8.1* 8.2*   Liver Function Tests: Recent Labs  Lab 12/09/20 1730 12/10/20 0241  AST 26 24  ALT 20 18  ALKPHOS 54 47  BILITOT 1.2 1.1  PROT 6.2* 6.7  ALBUMIN 3.1* 3.4*   No results for input(s): LIPASE, AMYLASE in the last 168  hours. Recent Labs  Lab 12/09/20 1907  AMMONIA 18   Coagulation Profile: Recent Labs  Lab 12/09/20 1907  INR 1.3*   CBC: Recent Labs  Lab 12/09/20 1730 12/10/20 0241  WBC 11.4* 6.9  NEUTROABS 8.2*  --   HGB 12.1* 12.5*  HCT 35.8* 36.8*  MCV 101.4* 101.1*  PLT 73* 52*   Cardiac Enzymes: No results for input(s): CKTOTAL, CKMB, CKMBINDEX, TROPONINI in the last 168 hours. BNP: Invalid input(s): POCBNP CBG: Recent Labs  Lab 12/09/20 1806 12/10/20 0744 12/10/20 0812 12/10/20 1127  GLUCAP 164* 44* 99 114*   HbA1C: Recent Labs    12/10/20 0241  HGBA1C 7.9*   Urine analysis:    Component Value Date/Time   COLORURINE YELLOW 12/09/2020 1700   APPEARANCEUR CLEAR 12/09/2020 1700   LABSPEC 1.008 12/09/2020 1700   PHURINE 5.0 12/09/2020 1700   GLUCOSEU NEGATIVE 12/09/2020 1700   HGBUR NEGATIVE  12/09/2020 1700   BILIRUBINUR NEGATIVE 12/09/2020 1700   KETONESUR NEGATIVE 12/09/2020 1700   PROTEINUR NEGATIVE 12/09/2020 1700   UROBILINOGEN 1.0 08/28/2015 1634   NITRITE NEGATIVE 12/09/2020 1700   LEUKOCYTESUR NEGATIVE 12/09/2020 1700   Sepsis Labs: @LABRCNTIP (procalcitonin:4,lacticidven:4) ) Recent Results (from the past 240 hour(s))  Resp Panel by RT-PCR (Flu A&B, Covid) Nasopharyngeal Swab     Status: None   Collection Time: 12/09/20  5:30 PM   Specimen: Nasopharyngeal Swab; Nasopharyngeal(NP) swabs in vial transport medium  Result Value Ref Range Status   SARS Coronavirus 2 by RT PCR NEGATIVE NEGATIVE Final    Comment: (NOTE) SARS-CoV-2 target nucleic acids are NOT DETECTED.  The SARS-CoV-2 RNA is generally detectable in upper respiratory specimens during the acute phase of infection. The lowest concentration of SARS-CoV-2 viral copies this assay can detect is 138 copies/mL. A negative result does not preclude SARS-Cov-2 infection and should not be used as the sole basis for treatment or other patient management decisions. A negative result may occur with   improper specimen collection/handling, submission of specimen other than nasopharyngeal swab, presence of viral mutation(s) within the areas targeted by this assay, and inadequate number of viral copies(<138 copies/mL). A negative result must be combined with clinical observations, patient history, and epidemiological information. The expected result is Negative.  Fact Sheet for Patients:  EntrepreneurPulse.com.au  Fact Sheet for Healthcare Providers:  IncredibleEmployment.be  This test is no t yet approved or cleared by the [place] FDA and  has been authorized for detection and/or diagnosis of SARS-CoV-2 by FDA under an Emergency Use Authorization (EUA). This EUA will remain  in effect (meaning this test can be used) for the duration of the COVID-19 declaration under Section 564(b)(1) of the Act, 21 U.S.C.section 360bbb-3(b)(1), unless the authorization is terminated  or revoked sooner.       Influenza A by PCR NEGATIVE NEGATIVE Final   Influenza B by PCR NEGATIVE NEGATIVE Final    Comment: (NOTE) The Xpert Xpress SARS-CoV-2/FLU/RSV plus assay is intended as an aid in the diagnosis of influenza from Nasopharyngeal swab specimens and should not be used as a sole basis for treatment. Nasal washings and aspirates are unacceptable for Xpert Xpress SARS-CoV-2/FLU/RSV testing.  Fact Sheet for Patients: EntrepreneurPulse.com.au  Fact Sheet for Healthcare Providers: IncredibleEmployment.be  This test is not yet approved or cleared by the [place] FDA and has been authorized for detection and/or diagnosis of SARS-CoV-2 by FDA under an Emergency Use Authorization (EUA). This EUA will remain in effect (meaning this test can be used) for the duration of the COVID-19 declaration under Section 564(b)(1) of the Act, 21 U.S.C. section 360bbb-3(b)(1), unless the authorization is terminated  or revoked.  Performed at [facility], [address], [place], [place] [address]   MRSA PCR Screening     Status: None   Collection Time: 12/10/20  1:41 AM   Specimen: Nasal Mucosa; Nasopharyngeal  Result Value Ref Range Status   MRSA by PCR NEGATIVE NEGATIVE Final    Comment:        The GeneXpert MRSA Assay (FDA approved for NASAL specimens only), is one component of a comprehensive MRSA colonization surveillance program. It is not intended to diagnose MRSA infection nor to guide or monitor treatment for MRSA infections. Performed at [facility], [address], [place], [place] [address]      Scheduled Meds: . aspirin EC  81 mg Oral QHS  . Chlorhexidine Gluconate Cloth  6 each Topical Daily  .  cholecalciferol  5,000 Units Oral q AM  . ferrous sulfate  325 mg Oral q AM  . insulin aspart  0-15 Units Subcutaneous TID WC  . insulin glargine  20 Units Subcutaneous QHS  . [START ON 12/11/2020] insulin glargine  30 Units Subcutaneous Daily  . lactulose  20 g Oral Daily  . loratadine  10 mg Oral q AM  . magnesium oxide  200 mg Oral Daily  . midodrine  5 mg Oral TID WC  . pantoprazole  40 mg Oral Daily  . pravastatin  20 mg Oral Daily   Continuous Infusions: . sodium chloride 125 mL/hr at 12/10/20 1117  . albumin human 25 g (12/10/20 0831)  . octreotide  (SANDOSTATIN)    IV infusion 40 mcg/hr (12/10/20 0718)    Procedures/Studies: DG Chest Portable 1 View  Result Date: 12/09/2020 CLINICAL DATA:  Hypotension EXAM: PORTABLE CHEST 1 VIEW COMPARISON:  10/28/2020 FINDINGS: Single frontal view of the chest demonstrates a stable cardiac silhouette. Postsurgical changes from median sternotomy. No airspace disease, effusion, or pneumothorax. No acute bony abnormalities. IMPRESSION: 1. Stable exam, no acute process. Electronically Signed   By: Randa Ngo M.D.   On: 12/09/2020 17:41   Home sleep test  Result Date: 11/12/2020 Larey Seat, MD     11/25/2020  5:17 PM  [facility]/ [facility] HOME SLEEP TEST (Watch PAT) STUDY DATE: data load on 11/19/20 DOB: December 22, 1960 MRN: [id number] ORDERING CLINICIAN: Larey Seat, MD  REFERRING CLINICIAN: [name], MD CLINICAL INFORMATION/HISTORY: 11.4.2021; [name]. Meyn "[name]" is a meanwhile  59 year old Caucasian gentleman who has followed in our practice for over 5 years with a diagnosis of obstructive sleep apnea snoring and is being treated on CPAP.  His risk factors have always included morbid obesity, CAD and he used Modafinil for EDS. He was recently told that he will need a liver transplant in response to a NASH nonalcoholic steatosis , the Liver is cirrhotic. Had pancreatic failure- He states that his pancreas no longer produces either digestive enzymes nor insulin. Has diabetes and uses a no needle check device. CPAP: His C-Flex machine was interrogated today , it is a Presenter, broadcasting auto CPAP- this device had been recalled.  He has used it compliantly for 97% of the last 30 days, his usual average daily user time is 4 hours 42 minutes and he achieves a reduced AHI of only 0.3/h . This CPAP is set at 9 cm water.  There are sometimes air leaks but these seem to be related to movement going to the bathroom etc. The humidification setting has been medium, he has a C-Flex function of 1 cm water, the equivalent of an expiratory pressure relief.  He is on 10 cm ramp starting at 4 cmH2O pressure and reaching his max pressure of 9 cm. He never used an ozone cleaner- his machine doesn't make noise and he has not noted particles in his water-chamber. Epworth sleepiness score: 4/24. BMI: 35.36 kg/m FINDINGS: Total Record Time (hours, min): 7 h 15 min Total Sleep Time (hours, min):  5 h 40 min Percent REM (%):    20.26 % Calculated pAHI (per hour):  5.9       REM pAHI:    19.1     NREM pAHI: 2.5 Supine AHI: N/A  Oxygen Saturation (%) Mean: 93  Minimum oxygen saturation (%):        87 O2 Saturation  Range (%): 87-98  O2Saturation (minutes) <=88%: 2.4 min Pulse Mean (bpm):  78  Pulse Range (68-94) IMPRESSION: Mild OSA (obstructive sleep apnea), OSA is much reduced to an AHI of only 5.9/h overall and REM AHI of 19.1/h. There was no prolonged hypoxia and no tachy-bradycardia noted. RECOMMENDATION: The patient has struggled with Excessive Daytime Sleepiness in the past, he can continue to use CPAP if his degree of sleepiness benefits from this or has the option to discontinue the therapy. Apnea is mild enough to make PAP therapy optional.   INTERPRETING PHYSICIAN: Larey Seat, MD     Orson Eva, DO  [facility]  If 7PM-7AM, please contact night-coverage [email or web address] Password [facility] 12/10/2020, 12:35 PM   LOS: 0 days

## 2020-12-10 NOTE — Progress Notes (Signed)
Inpatient Diabetes Program Recommendations  AACE/ADA: New Consensus Statement on Inpatient Glycemic Control (2015)  Target Ranges:  Prepandial:   less than 140 mg/dL      Peak postprandial:   less than 180 mg/dL (1-2 hours)      Critically ill patients:  140 - 180 mg/dL   Lab Results  Component Value Date   GLUCAP 114 (H) 12/10/2020   HGBA1C 7.9 (H) 12/10/2020    Review of Glycemic Control Results for [name], [name]" (MRN [id number]) as of 12/10/2020 12:06  Ref. Range 12/09/2020 18:06 12/10/2020 07:44 12/10/2020 08:12 12/10/2020 11:27  Glucose-Capillary Latest Ref Range: 70 - 99 mg/dL 164 (H) 44 (LL) 99 114 (H)   Diabetes history: DM2 Outpatient Diabetes medications: Tresiba 70 units am + 40 units pm + Novolog 5-30 units tid Current orders for Inpatient glycemic control: Lantus 70 units am + 40 units hs + Novolog 0-15 units tid  Inpatient Diabetes Program Recommendations:   -Consider decrease Lantus to 35 units am + 20 units hs -Decrease Novolog correction to sensitive tid + hs 0-5 units -Add Novolog meal coverage as needed  Thank you, Bethena Roys E. Mylin Hirano, RN, MSN, CDE  Diabetes Coordinator Inpatient Glycemic Control Team Team Pager [phone] (8am-5pm) 12/10/2020 12:11 PM

## 2020-12-10 NOTE — Evaluation (Signed)
Physical Therapy Evaluation Patient Details Name: [name] MRN: [id number] DOB: 16-Jan-1961 Today's Date: 12/10/2020   History of Present Illness  [name] is a 59 y.o. male with medical history significant of microcytic anemia, osteoarthritis, CAD/CABG, liver cirrhosis, stage III CKD, chronic diastolic CHF, essential hypertension, hyperlipidemia, OSA on CPAP, polyclonal gammopathy, thrombocytopenia, type II DM who is coming to the emergency department referred from Dr. Tomie China office after developing hypotension at the office.  The patient just yesterday got his carvedilol increased from 3.125 mg to 6.25 mg and states he has been lightheaded since.  He also endorses decreased urination over the past 2 days.  He denies fever, chills, rhinorrhea or sore throat.  No wheezing, dyspnea or hemoptysis.  Denies chest pain, palpitations, diaphoresis, PND, orthopnea, but gets occasional pitting edema of the lower extremities.  No abdominal pain, nausea, vomiting, diarrhea, constipation, melena or hematochezia.  No dysuria, frequency or hematuria, but decreased urination.  No polyuria, polydipsia, polyphagia or blurred vision.    Clinical Impression  Patient demonstrates slow labored movement for sitting up at bedside, limited to a few steps at bedside mostly due to c/o fatigue, had to lean on armrest of chair for support, declined to attempt further ambulation due to fatigue and tolerated staying up in chair after therapy - RN notified.  Patient will benefit from continued physical therapy in hospital and recommended venue below to increase strength, balance, endurance for safe ADLs and gait.     Follow Up Recommendations Home health PT;Supervision for mobility/OOB;Supervision - Intermittent    Equipment Recommendations  None recommended by PT    Recommendations for Other Services       Precautions / Restrictions Precautions Precautions: Fall Restrictions Weight Bearing  Restrictions: No      Mobility  Bed Mobility Overal bed mobility: Needs Assistance Bed Mobility: Supine to Sit     Supine to sit: Min guard;Supervision     General bed mobility comments: labored movement, increased time    Transfers Overall transfer level: Needs assistance Equipment used: 1 person hand held assist Transfers: Sit to/from [facility] Sit to Stand: Min assist Stand pivot transfers: Min assist       General transfer comment: slow labored movement having to lean on armrest of chair for support  Ambulation/Gait Ambulation/Gait assistance: Min assist Gait Distance (Feet): 4 Feet Assistive device: 1 person hand held assist Gait Pattern/deviations: Decreased step length - right;Decreased step length - left;Decreased stride length Gait velocity: decreased   General Gait Details: limited to 4-5 slow labored unsteady steps at bedside due to fair/poor standing balance and fatigue  Stairs            Wheelchair Mobility    Modified Rankin (Stroke Patients Only)       Balance Overall balance assessment: Needs assistance Sitting-balance support: Feet supported;No upper extremity supported Sitting balance-Leahy Scale: Good Sitting balance - Comments: seated at EOB   Standing balance support: During functional activity;No upper extremity supported Standing balance-Leahy Scale: Poor Standing balance comment: fair/poor without AD                             Pertinent Vitals/Pain Pain Assessment: No/denies pain    Home Living Family/patient expects to be discharged to:: Private residence Living Arrangements: Spouse/significant other Available Help at Discharge: Family;Available 24 hours/day Type of Home: House Home Access: Stairs to enter Entrance Stairs-Rails: Left Entrance Stairs-Number of Steps: 2 Home Layout: One  level Home Equipment: Walker - 2 wheels;Cane - single point;Cane - quad      Prior Function Level of  Independence: Independent with assistive device(s)         Comments: Lawyer SPC     Hand Dominance   Dominant Hand: Right    Extremity/Trunk Assessment   Upper Extremity Assessment Upper Extremity Assessment: Generalized weakness    Lower Extremity Assessment Lower Extremity Assessment: Generalized weakness    Cervical / Trunk Assessment Cervical / Trunk Assessment: Normal  Communication   Communication: No difficulties  Cognition Arousal/Alertness: Awake/alert Behavior During Therapy: WFL for tasks assessed/performed Overall Cognitive Status: Within Functional Limits for tasks assessed                                        General Comments      Exercises     Assessment/Plan    PT Assessment Patient needs continued PT services  PT Problem List Decreased strength;Decreased activity tolerance;Decreased balance;Decreased mobility       PT Treatment Interventions DME instruction;Gait training;Stair training;Functional mobility training;Therapeutic activities;Therapeutic exercise;Balance training    PT Goals (Current goals can be found in the Care Plan section)  Acute Rehab PT Goals Patient Stated Goal: return home with family to assist PT Goal Formulation: With patient Time For Goal Achievement: 12/15/20 Potential to Achieve Goals: Good    Frequency Min 3X/week   Barriers to discharge        Co-evaluation               AM-PAC PT "6 Clicks" Mobility  Outcome Measure Help needed turning from your back to your side while in a flat bed without using bedrails?: None Help needed moving from lying on your back to sitting on the side of a flat bed without using bedrails?: A Little Help needed moving to and from a bed to a chair (including a wheelchair)?: A Little Help needed standing up from a chair using your arms (e.g., wheelchair or bedside chair)?: A Little Help needed to walk in hospital room?: A Lot Help needed  climbing 3-5 steps with a railing? : A Lot 6 Click Score: 17    End of Session   Activity Tolerance: Patient tolerated treatment well;Patient limited by fatigue Patient left: in chair;with call bell/phone within reach Nurse Communication: Mobility status PT Visit Diagnosis: Unsteadiness on feet (R26.81);Other abnormalities of gait and mobility (R26.89);Muscle weakness (generalized) (M62.81)    Time: 9563-8756 PT Time Calculation (min) (ACUTE ONLY): 28 min   Charges:   PT Evaluation $PT Eval Moderate Complexity: 1 Mod PT Treatments $Therapeutic Activity: 23-37 mins        4:07 PM, 12/10/20 Lonell Grandchild, MPT Physical Therapist with [facility] 336 [phone] office [phone] mobile phone

## 2020-12-11 ENCOUNTER — Other Ambulatory Visit: Payer: Federal, State, Local not specified - PPO

## 2020-12-11 DIAGNOSIS — K7460 Unspecified cirrhosis of liver: Secondary | ICD-10-CM

## 2020-12-11 DIAGNOSIS — I959 Hypotension, unspecified: Secondary | ICD-10-CM

## 2020-12-11 LAB — T4, FREE: Free T4: 0.71 ng/dL (ref 0.61–1.12)

## 2020-12-11 LAB — CBC
HCT: 35.3 % — ABNORMAL LOW (ref 39.0–52.0)
Hemoglobin: 11.6 g/dL — ABNORMAL LOW (ref 13.0–17.0)
MCH: 34.1 pg — ABNORMAL HIGH (ref 26.0–34.0)
MCHC: 32.9 g/dL (ref 30.0–36.0)
MCV: 103.8 fL — ABNORMAL HIGH (ref 80.0–100.0)
Platelets: 55 10*3/uL — ABNORMAL LOW (ref 150–400)
RBC: 3.4 MIL/uL — ABNORMAL LOW (ref 4.22–5.81)
RDW: 14.7 % (ref 11.5–15.5)
WBC: 6 10*3/uL (ref 4.0–10.5)
nRBC: 0 % (ref 0.0–0.2)

## 2020-12-11 LAB — GLUCOSE, CAPILLARY
Glucose-Capillary: 156 mg/dL — ABNORMAL HIGH (ref 70–99)
Glucose-Capillary: 206 mg/dL — ABNORMAL HIGH (ref 70–99)
Glucose-Capillary: 218 mg/dL — ABNORMAL HIGH (ref 70–99)
Glucose-Capillary: 223 mg/dL — ABNORMAL HIGH (ref 70–99)
Glucose-Capillary: 50 mg/dL — ABNORMAL LOW (ref 70–99)
Glucose-Capillary: 63 mg/dL — ABNORMAL LOW (ref 70–99)
Glucose-Capillary: 66 mg/dL — ABNORMAL LOW (ref 70–99)
Glucose-Capillary: 81 mg/dL (ref 70–99)
Glucose-Capillary: 95 mg/dL (ref 70–99)

## 2020-12-11 LAB — BASIC METABOLIC PANEL
Anion gap: 10 (ref 5–15)
BUN: 37 mg/dL — ABNORMAL HIGH (ref 8–23)
CO2: 19 mmol/L — ABNORMAL LOW (ref 22–32)
Calcium: 8.4 mg/dL — ABNORMAL LOW (ref 8.9–10.3)
Chloride: 109 mmol/L (ref 98–111)
Creatinine, Ser: 1.66 mg/dL — ABNORMAL HIGH (ref 0.61–1.24)
GFR, Estimated: 47 mL/min — ABNORMAL LOW (ref >60)
Glucose, Bld: 67 mg/dL — ABNORMAL LOW (ref 70–99)
Potassium: 4.4 mmol/L (ref 3.5–5.1)
Sodium: 138 mmol/L (ref 135–145)

## 2020-12-11 LAB — CORTISOL-AM, BLOOD: Cortisol - AM: 15.3 ug/dL (ref 6.7–22.6)

## 2020-12-11 LAB — TSH: TSH: 0.414 u[IU]/mL (ref 0.350–4.500)

## 2020-12-11 LAB — MAGNESIUM: Magnesium: 2.1 mg/dL (ref 1.7–2.4)

## 2020-12-11 MED ORDER — DEXTROSE 50 % IV SOLN
INTRAVENOUS | Status: AC
  Administered 2020-12-11: 50 mL
  Filled 2020-12-11: qty 50

## 2020-12-11 MED ORDER — SODIUM CHLORIDE 0.9 % IV BOLUS
500.0000 mL | Freq: Once | INTRAVENOUS | Status: AC
  Administered 2020-12-11: 500 mL via INTRAVENOUS

## 2020-12-11 MED ORDER — SODIUM CHLORIDE 0.9 % IV BOLUS: 250.0000 mL | Freq: Once | INTRAVENOUS | Status: DC

## 2020-12-11 MED ORDER — ACETAMINOPHEN 325 MG PO TABS
650.0000 mg | ORAL_TABLET | Freq: Three times a day (TID) | ORAL | Status: DC | PRN
  Administered 2020-12-11 – 2020-12-16 (×5): 650 mg via ORAL
  Filled 2020-12-11 (×5): qty 2

## 2020-12-11 NOTE — Progress Notes (Signed)
Recheck CBG is 81

## 2020-12-11 NOTE — Progress Notes (Signed)
2:22AM RN called due to pt's SBP in the 70's, he was in no acute distress IV NS 500 mL bolus was given

## 2020-12-11 NOTE — NC FL2 (Signed)
[place] LEVEL OF CARE SCREENING TOOL     IDENTIFICATION  Patient Name: [name] Birthdate: March 23, 1961 Sex: male Admission Date (Current Location): 12/09/2020  [facility] and [place] Number:  [employer] and Address:  [place] [address] [place]      Provider Number: [id number]  Attending Physician Name and Address:  Orson Eva, MD  Relative Name and Phone Number:  Duvan, Mousel (Spouse)   [phone]    Current Level of Care: Hospital Recommended Level of Care: [place] Prior Approval Number:    Date Approved/Denied: 12/11/20 PASRR Number: [id number]  Discharge Plan: SNF    Current Diagnoses: Patient Active Problem List   Diagnosis Date Noted  . Hypotension 12/10/2020  . Acute renal failure superimposed on stage 3b chronic kidney disease ([place]) 12/10/2020  . Acute renal failure superimposed on stage 3b chronic kidney disease, unspecified acute renal failure type ([place]) 12/10/2020  . AKI (acute kidney injury) ([place]) 12/09/2020  . GERD (gastroesophageal reflux disease) 10/09/2020  . Anasarca   . Cirrhosis of liver ([place])   . Acute on chronic heart failure with preserved ejection fraction ([place])   . Liver failure ([place]) 06/20/2020  . Esophageal varices ([name]) 01/09/2020  . History of adenomatous polyp of colon 01/09/2020  . Elevated AST (SGOT) 08/23/2019  . Diarrhea 07/17/2019  . Hypersomnia, persistent 03/12/2019  . Macrocytic anemia 01/18/2019  . Other pancytopenia ([place]) 01/18/2019  . Thrombocytopenia ([place]) 12/30/2018  . Polyclonal gammopathy 12/30/2018  . S/P total knee replacement, left 03/09/17 11/22/2017  . Primary osteoarthritis of left knee 03/09/2017  . History of nocturia 03/02/2017  . OSA on CPAP 03/02/2016  . Hypoxia, sleep related 03/02/2016  . Cyst of mediastinum 01/15/2016  . S/P CABG x 4 08/29/2015  . CAD (coronary artery disease)   . Obesity, Class III, BMI 40-49.9  (morbid obesity) ([place])   . Hyperglycemia   . Pain in the chest 08/23/2015  . Non-ST elevation MI (NSTEMI) ([place]) 08/23/2015  . Diabetes mellitus type 2, uncontrolled ([place]) 08/23/2015  . Hypertension 08/23/2015  . Hyperlipidemia 08/23/2015  . Type 2 diabetes mellitus with hyperglycemia ([place]) 2016  . Class 2 obesity 2016  . Hyponatremia 2016  . Osteoarthritis, knee 05/24/2012  . Knee pain 10/29/2011  . Knee stiffness 10/29/2011  . S/P right knee arthroscopy 10/26/2011  . Medial meniscus, posterior horn derangement 09/07/2011  . Lateral meniscus derangement 09/07/2011  . OA (osteoarthritis) of knee 09/07/2011  . Rotator cuff syndrome of left shoulder 08/19/2011  . Right knee meniscal tear 08/19/2011    Orientation RESPIRATION BLADDER Height & Weight     Self,Time,Situation,Place  Normal Incontinent,External catheter Weight: 251 lb 12.3 oz (114.2 kg) Height:  5' 10.5" (179.1 cm)  BEHAVIORAL SYMPTOMS/MOOD NEUROLOGICAL BOWEL NUTRITION STATUS      Continent Diet (renal/carb modified with fluid restriction Diet-HS Snack? Nothing; Fluid restriction: 1200 mL Fluid; Room service appropriate? Yes; Fluid consistency: Thin)  AMBULATORY STATUS COMMUNICATION OF NEEDS Skin   Extensive Assist Verbally Normal                       Personal Care Assistance Level of Assistance  Bathing,Feeding,Dressing Bathing Assistance: Maximum assistance Feeding assistance: Independent Dressing Assistance: Maximum assistance     Functional Limitations Info  Sight,Hearing,Speech Sight Info: Adequate Hearing Info: Adequate Speech Info: Adequate    SPECIAL CARE FACTORS FREQUENCY  PT (By licensed PT)     PT Frequency: 5x  Contractures Contractures Info: Not present    Additional Factors Info  Code Status,Allergies,Insulin Sliding Scale,Psychotropic Code Status Info: Full Allergies Info: Fluticasone Psychotropic Info: Xanax, Provigil Insulin Sliding Scale Info: Insulin aspart  (novoLOG) injections 0 - 15 units   0 dash 15 units sub continues, three times daily with meal first dose 12/10/2020  CVG <70: Implement hyperglycemia standing orders and referred to hypoglycemia standard orders sidebar report   CBG 70 - 120: 0 units   CBG 121 - 150: 3 units   CBG 151 - 250: 4 units   CBG 201 - 250: 7 units   CBG 251 - 300: 11 units   CBG 301 - 250 : 15 units   CBG 351 - 400: 20 units   CBG > 400: called MD and obtained STAT lab verification       Current Medications (12/11/2020):  This is the current hospital active medication list Current Facility-Administered Medications  Medication Dose Route Frequency Provider Last Rate Last Admin  . 0.9 %  sodium chloride infusion   Intravenous Continuous Reubin Milan, MD 125 mL/hr at 12/11/20 1518 Infusion Verify at 12/11/20 1518  . acetaminophen (TYLENOL) tablet 650 mg  650 mg Oral Q8H PRN Orson Eva, MD   650 mg at 12/11/20 0910  . albuterol (VENTOLIN HFA) 108 (90 Base) MCG/ACT inhaler 2 puff  2 puff Inhalation Q6H PRN Reubin Milan, MD      . ALPRAZolam Duanne Moron) tablet 0.5 mg  0.5 mg Oral Daily PRN Reubin Milan, MD      . aspirin EC tablet 81 mg  81 mg Oral QHS Reubin Milan, MD   81 mg at 12/10/20 2337  . Chlorhexidine Gluconate Cloth 2 % PADS 6 each  6 each Topical Daily Tat, David, MD   6 each at 12/11/20 1041  . cholecalciferol (VITAMIN D3) tablet 5,000 Units  5,000 Units Oral q AM Reubin Milan, MD   5,000 Units at 12/11/20 [phone]  . ferrous sulfate tablet 325 mg  325 mg Oral q AM Reubin Milan, MD   325 mg at 12/11/20 0911  . insulin aspart (novoLOG) injection 0-15 Units  0-15 Units Subcutaneous TID WC Reubin Milan, MD   3 Units at 12/11/20 1719  . lactulose (CHRONULAC) 10 GM/15ML solution 20 g  20 g Oral Daily Reubin Milan, MD   20 g at 12/11/20 0912  . loratadine (CLARITIN) tablet 10 mg  10 mg Oral q AM Reubin Milan, MD   10 mg at 12/11/20 0912  . magnesium oxide (MAG-OX)  tablet 200 mg  200 mg Oral Daily Reubin Milan, MD   200 mg at 12/11/20 0913  . modafinil (PROVIGIL) tablet 200 mg  200 mg Oral Daily PRN Reubin Milan, MD      . ondansetron [facility]) tablet 4 mg  4 mg Oral Q6H PRN Reubin Milan, MD       Or  . ondansetron [facility]) injection 4 mg  4 mg Intravenous Q6H PRN Reubin Milan, MD      . pantoprazole (PROTONIX) EC tablet 40 mg  40 mg Oral Daily Reubin Milan, MD   40 mg at 12/11/20 0912  . pravastatin (PRAVACHOL) tablet 20 mg  20 mg Oral Daily Reubin Milan, MD   20 mg at 12/11/20 7741     Discharge Medications: Please see discharge summary for a list of discharge medications.  Relevant Imaging Results:  Relevant Lab Results:  Additional Information Pt SSN: [id number]  Natasha Bence, LCSW

## 2020-12-11 NOTE — TOC Progression Note (Signed)
Transition of Care [facility]) - Progression Note    Patient Details  Name: [name] MRN: [id number] Date of Birth: 1961/11/22  Transition of Care [facility]) CM/SW [place], LCSW Phone Number: 12/11/2020, 5:43 PM  Clinical Narrative:    CSW notified by PT that patient's recommendation was changed to SNF due to the patient's wife not being able to assist or provide supervision at home. CSW contacted patient's wife to identify if patient is agreeable to SNF. Patient's wife reported that they are agreeable to SNF and at the time do not have a preference for SNF locations. CSW completed FL2 and faxed initial referral to local SNF's and surrounding counties. TOC to follow.   Expected Discharge Plan: [place] Barriers to Discharge: Continued Medical Work up  Expected Discharge Plan and Services Expected Discharge Plan: [place] In-house Referral: Clinical Social Work   Post Acute Care Choice: [place] arrangements for the past 2 months: Single Family Home                                       Social Determinants of Health (SDOH) Interventions    Readmission Risk Interventions Readmission Risk Prevention Plan 12/10/2020  Transportation Screening Complete  HRI or Home Care Consult Complete  Social Work Consult for Parachute Planning/Counseling Complete  Palliative Care Screening Not Applicable  Medication Review Press photographer) Complete  Some recent data might be hidden

## 2020-12-11 NOTE — Progress Notes (Addendum)
Physical Therapy Treatment Patient Details Name: [name] MRN: [id number] DOB: 11-Apr-1961 Today's Date: 12/11/2020    History of Present Illness [name] is a 59 y.o. male with medical history significant of microcytic anemia, osteoarthritis, CAD/CABG, liver cirrhosis, stage III CKD, chronic diastolic CHF, essential hypertension, hyperlipidemia, OSA on CPAP, polyclonal gammopathy, thrombocytopenia, type II DM who is coming to the emergency department referred from Dr. Tomie China office after developing hypotension at the office.  The patient just yesterday got his carvedilol increased from 3.125 mg to 6.25 mg and states he has been lightheaded since.  He also endorses decreased urination over the past 2 days.  He denies fever, chills, rhinorrhea or sore throat.  No wheezing, dyspnea or hemoptysis.  Denies chest pain, palpitations, diaphoresis, PND, orthopnea, but gets occasional pitting edema of the lower extremities.  No abdominal pain, nausea, vomiting, diarrhea, constipation, melena or hematochezia.  No dysuria, frequency or hematuria, but decreased urination.  No polyuria, polydipsia, polyphagia or blurred vision.    PT Comments    Patient agreeable for therapy and his spouse in room.  Patient demonstrates slow labored movement for sitting up at bedside, requires frequent rest breaks to complete functional task and limited to a few steps at bedside before having to sit due to generalized weakness/fatigue.  Patient tolerated sitting up in chair after therapy with his spouse present in room.  Patient's spouse states she has to work which will limit her ability to take care of patient - SW notified.  Patient's orthostatic BP's as follows: lying 88/47, sitting 97/59, standing 94/53 - RN notified.  Patient will benefit from continued physical therapy in hospital and recommended venue below to increase strength, balance, endurance for safe ADLs and gait.    Follow Up  Recommendations  SNF;Supervision for mobility/OOB;Supervision - Intermittent     Equipment Recommendations  None recommended by PT    Recommendations for Other Services       Precautions / Restrictions Precautions Precautions: Fall Restrictions Weight Bearing Restrictions: No    Mobility  Bed Mobility Overal bed mobility: Needs Assistance Bed Mobility: Supine to Sit     Supine to sit: Min assist     General bed mobility comments: slow labored movement, had difficulty going from supine to sitting with bed flat  Transfers Overall transfer level: Needs assistance Equipment used: Rolling walker (2 wheeled) Transfers: Sit to/from [facility] Sit to Stand: Min assist Stand pivot transfers: Min assist       General transfer comment: slow labored movement  Ambulation/Gait Ambulation/Gait assistance: Min assist Gait Distance (Feet): 5 Feet Assistive device: Rolling walker (2 wheeled) Gait Pattern/deviations: Decreased step length - right;Decreased step length - left;Decreased stride length Gait velocity: decreased   General Gait Details: limited to 5-6 slow labored unsteady steps using RW mostly due to c/o fatigue   Stairs             Wheelchair Mobility    Modified Rankin (Stroke Patients Only)       Balance Overall balance assessment: Needs assistance Sitting-balance support: Feet supported;No upper extremity supported Sitting balance-Leahy Scale: Good Sitting balance - Comments: seated at EOB   Standing balance support: During functional activity;Bilateral upper extremity supported Standing balance-Leahy Scale: Fair Standing balance comment: using RW                            Cognition Arousal/Alertness: Awake/alert Behavior During Therapy: WFL for tasks assessed/performed Overall Cognitive  Status: Within Functional Limits for tasks assessed                                        Exercises General  Exercises - Lower Extremity Long Arc Quad: Seated;AROM;Strengthening;Both;10 reps Hip Flexion/Marching: Seated;AROM;Strengthening;Both;10 reps Toe Raises: Seated;AROM;Strengthening;Both;10 reps Heel Raises: Seated;AROM;Strengthening;Both;10 reps    General Comments        Pertinent Vitals/Pain Pain Assessment: No/denies pain    Home Living                      Prior Function            PT Goals (current goals can now be found in the care plan section) Acute Rehab PT Goals Patient Stated Goal: return home with family to assist PT Goal Formulation: With patient/family Time For Goal Achievement: 12/25/20 Potential to Achieve Goals: Good Progress towards PT goals: Progressing toward goals    Frequency    Min 3X/week      PT Plan Current plan remains appropriate    Co-evaluation              AM-PAC PT "6 Clicks" Mobility   Outcome Measure  Help needed turning from your back to your side while in a flat bed without using bedrails?: A Little Help needed moving from lying on your back to sitting on the side of a flat bed without using bedrails?: A Little Help needed moving to and from a bed to a chair (including a wheelchair)?: A Little Help needed standing up from a chair using your arms (e.g., wheelchair or bedside chair)?: A Little Help needed to walk in hospital room?: A Lot Help needed climbing 3-5 steps with a railing? : A Lot 6 Click Score: 16    End of Session   Activity Tolerance: Patient tolerated treatment well;Patient limited by fatigue Patient left: in chair;with call bell/phone within reach;with family/visitor present Nurse Communication: Mobility status PT Visit Diagnosis: Unsteadiness on feet (R26.81);Other abnormalities of gait and mobility (R26.89);Muscle weakness (generalized) (M62.81)     Time: 0947-0962 PT Time Calculation (min) (ACUTE ONLY): 29 min  Charges:  $Therapeutic Exercise: 8-22 mins $Therapeutic Activity: 8-22  mins                     3:15 PM, 12/11/20 Lonell Grandchild, MPT Physical Therapist with [facility] 336 [phone] office [phone] mobile phone

## 2020-12-11 NOTE — Progress Notes (Signed)
Spoke with patient's spouse yesterday via telephone and updated her on patient's condition. She voiced concerns about his blood pressure and cough that she states has been present since he had Covid in February. Dr. Carles Collet also spoke with spouse yesterday via telephone. Patient's spouse is currently at bedside and has voiced these concerns once again. Patient and spouse were updated on his condition and plan of care. I gave patient a flutter valve to assist with any secretions and his cough.

## 2020-12-11 NOTE — Progress Notes (Signed)
Notified physician of not administering nighttime insulin due to CBG, monitored cbg additional times and it remained at the same levels (90s) Check of CBg this AM is low 60s , juice is administered and the recheck is still 60s , additional beverage administered and rechecks are ordered by physician

## 2020-12-11 NOTE — Progress Notes (Addendum)
0712, CBG 50. Administered Dextrose 50% solution at 0729. CBG rechecked at 0956 with result of 223.

## 2020-12-11 NOTE — Progress Notes (Signed)
PROGRESS NOTE  CARY LOTHROP HAL:[id number] DOB: 01-06-1961 DOA: 12/09/2020 PCP: [name], MD  Brief History:  59 year old male with a history of NAFLD/ALD cirrhosis, diabetes mellitus type 2, coronary artery disease, CKD stage III, diastolic CHF, hypertension, IDA presenting with lightheadedness and generalized malaise for approximately 2 days prior to this admission.  The patient stated that he felt groggy and cloudy.  He went to follow-up with his medical oncologist for his thrombocytopenia.  He was noted to have systolic blood pressure in the 60s.  As result, the patient was admitted for further evaluation.  The patient denies any fevers, chills, chest pain, shortness breath, cough, hemoptysis, nausea, vomiting, diarrhea, abdominal pain, dysuria, hematuria.  He did state that he has had decreased urine output for last few days prior to admission.  He denies any headache, neck pain.  The patient followed up with his hepatologist at [facility] on 12/02/2020 at which time he has nadolol was discontinued, and the patient was started on carvedilol 6.25 mg twice daily.  In addition, the patient has been instructed to increase his torsemide to 60 mg twice daily during a cardiology follow-up visit on 11/18/2020.  He was instructed to take it twice daily for 1 week, then decrease back to once daily.  Unfortunately, he continued to take it twice daily.  He was instructed to decrease back to once daily by his hepatologist at [facility] on 12/02/2020.  Other than that, the patient denied any new medications.  He stated that his appetite has been fair.  ED Course:Initial vital signs were temperature98.3 F, pulse 84, RR 22, BP 60/45 mmHg and O2 sat 94% on room air. The patient received 2 L of normal saline bolus in the emergency department which resulted in an increase of his systolic blood pressure to to the 90s.  Labwork:His urinalysis was normal. Lactic acid 1.4 and then 2.1 mmol/L. Coronavirus  and influenza PCR was negative. CBC showed a white count of 11.4 with 72% neutrophils, hemoglobin 12.1 g/dL with an MCV of 101.4 fL and platelets 73. PT 15.6 and INR 1.3. His sodium was 125, potassium 4.9, chloride 92 and CO2 20 mmol/L. Glucose 189, BUN 77 creatinine 4.00 mg/dL. LFTs show total protein of 6.2 and albumin of 3.1 g/dL. Total bilirubin slightly increased to 1.0 mg/dL. The rest of the hepatic functions are normal. Troponin was static and then 8 ng/L. Ammonia was 18 mol/L.  Assessment/Plan: Hypotension -Likely secondary to volume depletion and recent change in his antihypertensive regimen for his liver cirrhosis -Check procalcitonin -TSH--0.414 -A.m. cortisol--15.3 -PCT 0.11 -Continue IV fluids -Discontinued midodrine and octreotide -Holding torsemide which has been decreased back to 60 mg daily -BP remains hypotensive/soft but continues to improve  Acute on chronic renal failure--CKD 3B -Secondary to volume depletion and hemodynamic change -Baseline creatinine 1.6-1.9 -Patient presented with serum creatinine 4.00 -Continue IV fluids>>improved  Chronic diastolic CHF -He appears euvolemic clinically -Holding torsemide temporarily secondary to hypotension  Uncontrolled diabetes mellitus type 2 with hyperglycemia -12/10/2020 hemoglobin A1c 7.9 -d/c Lantus in the setting of acute kidney injury and hypoglycemia -Continue NovoLog sliding scale  NAFLD/ALD Cirrhosis -Holding spironolactone and torsemide temporarily secondary to hypotension and acute kidney injury -Continue lactulose -Patient follows hepatology at [facility]   Coronary artery disease -No chest pain presently -Continue aspirin 81 mg daily -Holding carvedilol secondary to hypotension  Thrombocytopenia -Secondary to liver cirrhosis -Monitor for signs of bleeding -Hemoglobin remained stable     Status  is: Inpatient  The patient will require care spanning > 2 midnights and should be  moved to inpatient because: IV treatments appropriate due to intensity of illness or inability to take PO  Dispo: The patient is from: Home  Anticipated d/c is to: Home  Anticipated d/c date is: 2 days  Patient currently is not medically stable to d/c.        Family Communication:  spouse updated at bedside  Consultants:  none  Code Status:  FULL   DVT Prophylaxis:  SCDs   Procedures: As Listed in Progress Note Above  Antibiotics: None   Total time spent 35 minutes.  Greater than 50% spent face to face counseling and coordinating care.   Subjective:  Patient denies fevers, chills, headache, chest pain, dyspnea, nausea, vomiting, diarrhea, abdominal pain, dysuria, hematuria, hematochezia, and melena.  Objective: Vitals:   12/11/20 1400 12/11/20 1500 12/11/20 1600 12/11/20 1700  BP: (!) 88/47 112/61 93/69 133/67  Pulse: 83  79 87  Resp: 15 16 (!) 21 14  Temp:      TempSrc:      SpO2: 100%  100% 100%  Weight:      Height:        Intake/Output Summary (Last 24 hours) at 12/11/2020 1833 Last data filed at 12/11/2020 1700 Gross per 24 hour  Intake 3619.27 ml  Output 3125 ml  Net 494.27 ml   Weight change:  Exam:   General:  Pt is alert, follows commands appropriately, not in acute distress  HEENT: No icterus, No thrush, No neck mass, [place]/AT  Cardiovascular: RRR, S1/S2, no rubs, no gallops  Respiratory: fine bibasilar rales. No wheeze  Abdomen: Soft/+BS, non tender, non distended, no guarding  Extremities: 1 + LE edema, No lymphangitis, No petechiae, No rashes, no synovitis   Data Reviewed: I have personally reviewed following labs and imaging studies Basic Metabolic Panel: Recent Labs  Lab 12/09/20 1730 12/10/20 0241 12/11/20 0508  NA 125* 133* 138  K 4.9 4.5 4.4  CL 92* 101 109  CO2 20* 21* 19*  GLUCOSE 189* 85 67*  BUN 77* 70* 37*  CREATININE 4.00* 3.09* 1.66*  CALCIUM 8.1* 8.2* 8.4*   MG  --   --  2.1   Liver Function Tests: Recent Labs  Lab 12/09/20 1730 12/10/20 0241  AST 26 24  ALT 20 18  ALKPHOS 54 47  BILITOT 1.2 1.1  PROT 6.2* 6.7  ALBUMIN 3.1* 3.4*   No results for input(s): LIPASE, AMYLASE in the last 168 hours. Recent Labs  Lab 12/09/20 1907  AMMONIA 18   Coagulation Profile: Recent Labs  Lab 12/09/20 1907  INR 1.3*   CBC: Recent Labs  Lab 12/09/20 1730 12/10/20 0241 12/11/20 0508  WBC 11.4* 6.9 6.0  NEUTROABS 8.2*  --   --   HGB 12.1* 12.5* 11.6*  HCT 35.8* 36.8* 35.3*  MCV 101.4* 101.1* 103.8*  PLT 73* 52* 55*   Cardiac Enzymes: No results for input(s): CKTOTAL, CKMB, CKMBINDEX, TROPONINI in the last 168 hours. BNP: Invalid input(s): POCBNP CBG: Recent Labs  Lab 12/11/20 0608 12/11/20 0712 12/11/20 0956 12/11/20 1106 12/11/20 1604  GLUCAP 81 50* 223* 206* 156*   HbA1C: Recent Labs    12/10/20 0241  HGBA1C 7.9*   Urine analysis:    Component Value Date/Time   COLORURINE YELLOW 12/09/2020 1700   APPEARANCEUR CLEAR 12/09/2020 1700   LABSPEC 1.008 12/09/2020 1700   PHURINE 5.0 12/09/2020 1700   GLUCOSEU NEGATIVE 12/09/2020 1700  HGBUR NEGATIVE 12/09/2020 1700   BILIRUBINUR NEGATIVE 12/09/2020 1700   KETONESUR NEGATIVE 12/09/2020 1700   PROTEINUR NEGATIVE 12/09/2020 1700   UROBILINOGEN 1.0 08/28/2015 1634   NITRITE NEGATIVE 12/09/2020 1700   LEUKOCYTESUR NEGATIVE 12/09/2020 1700   Sepsis Labs: @LABRCNTIP (procalcitonin:4,lacticidven:4) ) Recent Results (from the past 240 hour(s))  Resp Panel by RT-PCR (Flu A&B, Covid) Nasopharyngeal Swab     Status: None   Collection Time: 12/09/20  5:30 PM   Specimen: Nasopharyngeal Swab; Nasopharyngeal(NP) swabs in vial transport medium  Result Value Ref Range Status   SARS Coronavirus 2 by RT PCR NEGATIVE NEGATIVE Final    Comment: (NOTE) SARS-CoV-2 target nucleic acids are NOT DETECTED.  The SARS-CoV-2 RNA is generally detectable in upper respiratory specimens  during the acute phase of infection. The lowest concentration of SARS-CoV-2 viral copies this assay can detect is 138 copies/mL. A negative result does not preclude SARS-Cov-2 infection and should not be used as the sole basis for treatment or other patient management decisions. A negative result may occur with  improper specimen collection/handling, submission of specimen other than nasopharyngeal swab, presence of viral mutation(s) within the areas targeted by this assay, and inadequate number of viral copies(<138 copies/mL). A negative result must be combined with clinical observations, patient history, and epidemiological information. The expected result is Negative.  Fact Sheet for Patients:  EntrepreneurPulse.com.au  Fact Sheet for Healthcare Providers:  IncredibleEmployment.be  This test is no t yet approved or cleared by the [place] FDA and  has been authorized for detection and/or diagnosis of SARS-CoV-2 by FDA under an Emergency Use Authorization (EUA). This EUA will remain  in effect (meaning this test can be used) for the duration of the COVID-19 declaration under Section 564(b)(1) of the Act, 21 U.S.C.section 360bbb-3(b)(1), unless the authorization is terminated  or revoked sooner.       Influenza A by PCR NEGATIVE NEGATIVE Final   Influenza B by PCR NEGATIVE NEGATIVE Final    Comment: (NOTE) The Xpert Xpress SARS-CoV-2/FLU/RSV plus assay is intended as an aid in the diagnosis of influenza from Nasopharyngeal swab specimens and should not be used as a sole basis for treatment. Nasal washings and aspirates are unacceptable for Xpert Xpress SARS-CoV-2/FLU/RSV testing.  Fact Sheet for Patients: EntrepreneurPulse.com.au  Fact Sheet for Healthcare Providers: IncredibleEmployment.be  This test is not yet approved or cleared by the [place] FDA and has been authorized for detection  and/or diagnosis of SARS-CoV-2 by FDA under an Emergency Use Authorization (EUA). This EUA will remain in effect (meaning this test can be used) for the duration of the COVID-19 declaration under Section 564(b)(1) of the Act, 21 U.S.C. section 360bbb-3(b)(1), unless the authorization is terminated or revoked.  Performed at [facility], [address] [place], [place] [address]   MRSA PCR Screening     Status: None   Collection Time: 12/10/20  1:41 AM   Specimen: Nasal Mucosa; Nasopharyngeal  Result Value Ref Range Status   MRSA by PCR NEGATIVE NEGATIVE Final    Comment:        The GeneXpert MRSA Assay (FDA approved for NASAL specimens only), is one component of a comprehensive MRSA colonization surveillance program. It is not intended to diagnose MRSA infection nor to guide or monitor treatment for MRSA infections. Performed at [facility], [address], [place], [place] [address]      Scheduled Meds: . aspirin EC  81 mg Oral QHS  . Chlorhexidine Gluconate Cloth  6 each Topical Daily  .  cholecalciferol  5,000 Units Oral q AM  . ferrous sulfate  325 mg Oral q AM  . insulin aspart  0-15 Units Subcutaneous TID WC  . lactulose  20 g Oral Daily  . loratadine  10 mg Oral q AM  . magnesium oxide  200 mg Oral Daily  . pantoprazole  40 mg Oral Daily  . pravastatin  20 mg Oral Daily   Continuous Infusions: . sodium chloride 125 mL/hr at 12/11/20 1518    Procedures/Studies: DG Chest Portable 1 View  Result Date: 12/09/2020 CLINICAL DATA:  Hypotension EXAM: PORTABLE CHEST 1 VIEW COMPARISON:  10/28/2020 FINDINGS: Single frontal view of the chest demonstrates a stable cardiac silhouette. Postsurgical changes from median sternotomy. No airspace disease, effusion, or pneumothorax. No acute bony abnormalities. IMPRESSION: 1. Stable exam, no acute process. Electronically Signed   By: Randa Ngo M.D.   On: 12/09/2020 17:41   Home sleep test  Result Date: 11/12/2020 Larey Seat, MD     11/25/2020  5:17 PM [facility]/ [facility] HOME SLEEP TEST (Watch PAT) STUDY DATE: data load on 11/19/20 DOB: August 04, 1961 MRN: [id number] ORDERING CLINICIAN: Larey Seat, MD  REFERRING CLINICIAN: [name], MD CLINICAL INFORMATION/HISTORY: 11.4.2021; [name]. Brink "[name]" is a meanwhile  59 year old Caucasian gentleman who has followed in our practice for over 5 years with a diagnosis of obstructive sleep apnea snoring and is being treated on CPAP.  His risk factors have always included morbid obesity, CAD and he used Modafinil for EDS. He was recently told that he will need a liver transplant in response to a NASH nonalcoholic steatosis , the Liver is cirrhotic. Had pancreatic failure- He states that his pancreas no longer produces either digestive enzymes nor insulin. Has diabetes and uses a no needle check device. CPAP: His C-Flex machine was interrogated today , it is a Presenter, broadcasting auto CPAP- this device had been recalled.  He has used it compliantly for 97% of the last 30 days, his usual average daily user time is 4 hours 42 minutes and he achieves a reduced AHI of only 0.3/h . This CPAP is set at 9 cm water.  There are sometimes air leaks but these seem to be related to movement going to the bathroom etc. The humidification setting has been medium, he has a C-Flex function of 1 cm water, the equivalent of an expiratory pressure relief.  He is on 10 cm ramp starting at 4 cmH2O pressure and reaching his max pressure of 9 cm. He never used an ozone cleaner- his machine doesn't make noise and he has not noted particles in his water-chamber. Epworth sleepiness score: 4/24. BMI: 35.36 kg/m FINDINGS: Total Record Time (hours, min): 7 h 15 min Total Sleep Time (hours, min):  5 h 40 min Percent REM (%):    20.26 % Calculated pAHI (per hour):  5.9       REM pAHI:    19.1     NREM pAHI: 2.5 Supine AHI: N/A  Oxygen Saturation (%) Mean: 93  Minimum oxygen  saturation (%):        87 O2 Saturation Range (%): 87-98  O2Saturation (minutes) <=88%: 2.4 min Pulse Mean (bpm):    78  Pulse Range (68-94) IMPRESSION: Mild OSA (obstructive sleep apnea), OSA is much reduced to an AHI of only 5.9/h overall and REM AHI of 19.1/h. There was no prolonged hypoxia and no tachy-bradycardia noted. RECOMMENDATION: The patient has struggled with Excessive Daytime Sleepiness in the past, he  can continue to use CPAP if his degree of sleepiness benefits from this or has the option to discontinue the therapy. Apnea is mild enough to make PAP therapy optional.   INTERPRETING PHYSICIAN: Larey Seat, MD     Orson Eva, DO  [facility]  If 7PM-7AM, please contact night-coverage [email or web address] Password [email or web address] 12/11/2020, 6:33 PM   LOS: 1 day

## 2020-12-12 DIAGNOSIS — E11649 Type 2 diabetes mellitus with hypoglycemia without coma: Secondary | ICD-10-CM

## 2020-12-12 DIAGNOSIS — I952 Hypotension due to drugs: Secondary | ICD-10-CM

## 2020-12-12 LAB — GLUCOSE, CAPILLARY
Glucose-Capillary: 87 mg/dL (ref 70–99)
Glucose-Capillary: 58 mg/dL — ABNORMAL LOW (ref 70–99)
Glucose-Capillary: 108 mg/dL — ABNORMAL HIGH (ref 70–99)
Glucose-Capillary: 185 mg/dL — ABNORMAL HIGH (ref 70–99)
Glucose-Capillary: 309 mg/dL — ABNORMAL HIGH (ref 70–99)
Glucose-Capillary: 264 mg/dL — ABNORMAL HIGH (ref 70–99)

## 2020-12-12 LAB — BASIC METABOLIC PANEL
Anion gap: 5 (ref 5–15)
BUN: 27 mg/dL — ABNORMAL HIGH (ref 8–23)
CO2: 19 mmol/L — ABNORMAL LOW (ref 22–32)
Calcium: 8.2 mg/dL — ABNORMAL LOW (ref 8.9–10.3)
Chloride: 108 mmol/L (ref 98–111)
Creatinine, Ser: 1.38 mg/dL — ABNORMAL HIGH (ref 0.61–1.24)
GFR, Estimated: 58 mL/min — ABNORMAL LOW (ref >60)
Glucose, Bld: 86 mg/dL (ref 70–99)
Potassium: 4.3 mmol/L (ref 3.5–5.1)
Sodium: 132 mmol/L — ABNORMAL LOW (ref 135–145)

## 2020-12-12 LAB — MAGNESIUM: Magnesium: 1.9 mg/dL (ref 1.7–2.4)

## 2020-12-12 MED ORDER — INSULIN ASPART 100 UNIT/ML ~~LOC~~ SOLN
0.0000 [IU] | Freq: Three times a day (TID) | SUBCUTANEOUS | Status: DC
  Administered 2020-12-12: 7 [IU] via SUBCUTANEOUS
  Administered 2020-12-13: 3 [IU] via SUBCUTANEOUS
  Administered 2020-12-13: 2 [IU] via SUBCUTANEOUS
  Administered 2020-12-14: 3 [IU] via SUBCUTANEOUS
  Administered 2020-12-14 – 2020-12-15 (×4): 2 [IU] via SUBCUTANEOUS
  Administered 2020-12-16: 5 [IU] via SUBCUTANEOUS
  Administered 2020-12-16: 3 [IU] via SUBCUTANEOUS

## 2020-12-12 MED ORDER — INSULIN ASPART 100 UNIT/ML ~~LOC~~ SOLN
0.0000 [IU] | Freq: Every day | SUBCUTANEOUS | Status: DC
  Administered 2020-12-12 – 2020-12-13 (×2): 3 [IU] via SUBCUTANEOUS
  Administered 2020-12-14: 2 [IU] via SUBCUTANEOUS
  Administered 2020-12-15: 3 [IU] via SUBCUTANEOUS

## 2020-12-12 NOTE — TOC Progression Note (Signed)
Transition of Care [facility]) - Progression Note    Patient Details  Name: [name] MRN: [id number] Date of Birth: [birth year]  Transition of Care [facility]) CM/SW Contact  Natasha Bence, LCSW Phone Number: 12/12/2020, 4:37 PM  Clinical Narrative:    CSW received SNF recommendation. CSW contacted patient's wife for SNF recommendation. Patient's wife agreeable to referral. CSW discussed bed offers with wife. Wife reported that she preferred [facility]. CSW informed patient's wife that [facility]'s admissions is not available for the holiday weekend. Patient's wife agreeable to [facility]. Ebony Hail with [employer] reported that due to [place] being patient's primary ins, she will need to submit auth with [employer] instead of patient's Medicare. [employer] also reported that Josem Kaufmann will not be able to be started until Monday due to [facility] hours of operation TOC to follow.    Expected Discharge Plan: [place] Barriers to Discharge: Continued Medical Work up  Expected Discharge Plan and Services Expected Discharge Plan: [place] In-house Referral: Clinical Social Work   Post Acute Care Choice: [place] arrangements for the past 2 months: Single Family Home                                       Social Determinants of Health (SDOH) Interventions    Readmission Risk Interventions Readmission Risk Prevention Plan 12/10/2020  Transportation Screening Complete  HRI or Home Care Consult Complete  Social Work Consult for [place] Planning/Counseling Complete  Palliative Care Screening Not Applicable  Medication Review Press photographer) Complete  Some recent data might be hidden

## 2020-12-12 NOTE — Progress Notes (Signed)
PROGRESS NOTE  [name] VFI:[id number] DOB: 01-Jun-1961 DOA: 12/09/2020 PCP: [name], MD  Brief History: 59 year old male with a history ofNAFLD/ALDcirrhosis, diabetes mellitus type 2, coronary artery disease, CKD stage III, diastolic CHF, hypertension, IDApresenting with lightheadedness and generalized malaise for approximately 2 days prior to this admission. The patient stated that he felt groggy and cloudy. He went to follow-up with his medical oncologist for his thrombocytopenia. He was noted to have systolic blood pressure in the 60s. As result, the patient was admitted for further evaluation. The patient denies any fevers, chills, chest pain, shortness breath, cough, hemoptysis, nausea, vomiting, diarrhea, abdominal pain, dysuria, hematuria. He did state that he has had decreased urine output for last few days prior to admission. He denies any headache, neck pain.The patient followed up with his hepatologist at [facility] on 12/02/2020 at which time he has nadolol was discontinued, and the patient was started on carvedilol 6.25 mg twice daily. In addition, the patient has been instructed to increase his torsemide to 60 mg twice daily during a cardiology follow-up visit on 11/18/2020. He was instructed to take it twice daily for 1 week, then decrease back to once daily. Unfortunately, he continued to take it twice daily. He was instructed to decrease back to once daily by his hepatologist at [facility] on 12/02/2020. Other than that, the patient denied any new medications. He stated that his appetite has been fair.  ED Course:Initial vital signs were temperature98.3 F, pulse 84, RR 22, BP 60/45 mmHg and O2 sat 94% on room air. The patient received 2 L of normal saline bolus in the emergency department which resulted in an increase of his systolic blood pressure to to the 90s.  Labwork:His urinalysis was normal. Lactic acid 1.4 and then 2.1 mmol/L. Coronavirus  and influenza PCR was negative. CBC showed a white count of 11.4 with 72% neutrophils, hemoglobin 12.1 g/dL with an MCV of 101.4 fL and platelets 73. PT 15.6 and INR 1.3. His sodium was 125, potassium 4.9, chloride 92 and CO2 20 mmol/L. Glucose 189, BUN 77 creatinine 4.00 mg/dL. LFTs show total protein of 6.2 and albumin of 3.1 g/dL. Total bilirubin slightly increased to 1.0 mg/dL. The rest of the hepatic functions are normal. Troponin was static and then 8 ng/L. Ammonia was 18 mol/L.  Assessment/Plan: Hypotension -secondary to volume depletion and recent change in his antihypertensive regimen for his liver cirrhosis -Check procalcitonin--0.11 -TSH--0.414 -A.m. cortisol--15.3 -PCT 0.11 -Continue IV fluids>>saline lock -Discontinued midodrine and octreotide -Holding torsemide which had been decreased back to 60 mg daily -BP remains sofft but continues to improve -12/31--discussed with pt's hepatologist--[name] to hold coreg and diuretics for now--instruct patient to keep BP log after d/c and she will call patient next week to make any new changes  Acute on chronic renal failure--CKD 3B -Secondary to volume depletion and hemodynamic change -Baseline creatinine 1.6-1.9 -Patient presented with serum creatinine 4.00 -Continue IV fluids>>improved  Chronic diastolic CHF -He appears euvolemic clinically -Holding torsemide temporarily secondary to hypotension  Uncontrolled diabetes mellitus type 2 with hyperglycemia -12/10/2020 hemoglobin A1c 7.9 -d/c Lantus in the setting of acute kidney injury and hypoglycemia -Continue NovoLog sliding scale  NAFLD/ALD Cirrhosis -Holding spironolactone and torsemide temporarily secondary to hypotension and acute kidney injury -Continue lactulose -Patient follows hepatology at [facility]  -discussed with [name]  Coronary artery disease -No chest pain presently -Continue aspirin 81 mg daily -Holding carvedilol secondary to  hypotension  Thrombocytopenia -  Secondary to liver cirrhosis -Monitor for signs of bleeding -Hemoglobin remained stable     Status is: Inpatient  The patient will require care spanning > 2 midnights and should be moved to inpatient because:IV treatments appropriate due to intensity of illness or inability to take PO  Dispo: The patient is from:Home Anticipated d/c is to:SNF Anticipated d/c date is: 1 days Patient currently is not medically stable to d/c.        Family Communication:spouse updated at bedside 12/30  Consultants:none  Code Status: FULL   DVT Prophylaxis: SCDs   Procedures: As Listed in Progress Note Above  Antibiotics: None     Subjective: Patient denies fevers, chills, headache, chest pain, dyspnea, nausea, vomiting, diarrhea, abdominal pain, dysuria, hematuria, hematochezia, and melena.   Objective: Vitals:   12/12/20 1230 12/12/20 1300 12/12/20 1400 12/12/20 1430  BP: (!) 89/73 115/79 131/80 (!) 119/52  Pulse:      Resp: 17 20  (!) 21  Temp:      TempSrc:      SpO2:      Weight:      Height:        Intake/Output Summary (Last 24 hours) at 12/12/2020 1447 Last data filed at 12/12/2020 1400 Gross per 24 hour  Intake 2661.32 ml  Output 3350 ml  Net -688.68 ml   Weight change:  Exam:   General:  Pt is alert, follows commands appropriately, not in acute distress  HEENT: No icterus, No thrush, No neck mass, [place]/AT  Cardiovascular: RRR, S1/S2, no rubs, no gallops  Respiratory: fine bibasilar rales. No wheeze  Abdomen: Soft/+BS, non tender, non distended, no guarding  Extremities: 1+ LE edema, No lymphangitis, No petechiae, No rashes, no synovitis   Data Reviewed: I have personally reviewed following labs and imaging studies Basic Metabolic Panel: Recent Labs  Lab 12/09/20 1730 12/10/20 0241 12/11/20 0508 12/12/20 0539  NA 125* 133* 138 132*  K 4.9  4.5 4.4 4.3  CL 92* 101 109 108  CO2 20* 21* 19* 19*  GLUCOSE 189* 85 67* 86  BUN 77* 70* 37* 27*  CREATININE 4.00* 3.09* 1.66* 1.38*  CALCIUM 8.1* 8.2* 8.4* 8.2*  MG  --   --  2.1 1.9   Liver Function Tests: Recent Labs  Lab 12/09/20 1730 12/10/20 0241  AST 26 24  ALT 20 18  ALKPHOS 54 47  BILITOT 1.2 1.1  PROT 6.2* 6.7  ALBUMIN 3.1* 3.4*   No results for input(s): LIPASE, AMYLASE in the last 168 hours. Recent Labs  Lab 12/09/20 1907  AMMONIA 18   Coagulation Profile: Recent Labs  Lab 12/09/20 1907  INR 1.3*   CBC: Recent Labs  Lab 12/09/20 1730 12/10/20 0241 12/11/20 0508  WBC 11.4* 6.9 6.0  NEUTROABS 8.2*  --   --   HGB 12.1* 12.5* 11.6*  HCT 35.8* 36.8* 35.3*  MCV 101.4* 101.1* 103.8*  PLT 73* 52* 55*   Cardiac Enzymes: No results for input(s): CKTOTAL, CKMB, CKMBINDEX, TROPONINI in the last 168 hours. BNP: Invalid input(s): POCBNP CBG: Recent Labs  Lab 12/11/20 2349 12/12/20 0538 12/12/20 0809 12/12/20 0923 12/12/20 1142  GLUCAP 218* 87 58* 108* 185*   HbA1C: Recent Labs    12/10/20 0241  HGBA1C 7.9*   Urine analysis:    Component Value Date/Time   COLORURINE YELLOW 12/09/2020 1700   APPEARANCEUR CLEAR 12/09/2020 1700   LABSPEC 1.008 12/09/2020 1700   PHURINE 5.0 12/09/2020 1700   GLUCOSEU NEGATIVE 12/09/2020 1700  HGBUR NEGATIVE 12/09/2020 1700   BILIRUBINUR NEGATIVE 12/09/2020 1700   KETONESUR NEGATIVE 12/09/2020 1700   PROTEINUR NEGATIVE 12/09/2020 1700   UROBILINOGEN 1.0 08/28/2015 1634   NITRITE NEGATIVE 12/09/2020 1700   LEUKOCYTESUR NEGATIVE 12/09/2020 1700   Sepsis Labs: @LABRCNTIP (procalcitonin:4,lacticidven:4) ) Recent Results (from the past 240 hour(s))  Resp Panel by RT-PCR (Flu A&B, Covid) Nasopharyngeal Swab     Status: None   Collection Time: 12/09/20  5:30 PM   Specimen: Nasopharyngeal Swab; Nasopharyngeal(NP) swabs in vial transport medium  Result Value Ref Range Status   SARS Coronavirus 2 by RT PCR  NEGATIVE NEGATIVE Final    Comment: (NOTE) SARS-CoV-2 target nucleic acids are NOT DETECTED.  The SARS-CoV-2 RNA is generally detectable in upper respiratory specimens during the acute phase of infection. The lowest concentration of SARS-CoV-2 viral copies this assay can detect is 138 copies/mL. A negative result does not preclude SARS-Cov-2 infection and should not be used as the sole basis for treatment or other patient management decisions. A negative result may occur with  improper specimen collection/handling, submission of specimen other than nasopharyngeal swab, presence of viral mutation(s) within the areas targeted by this assay, and inadequate number of viral copies(<138 copies/mL). A negative result must be combined with clinical observations, patient history, and epidemiological information. The expected result is Negative.  Fact Sheet for Patients:  EntrepreneurPulse.com.au  Fact Sheet for Healthcare Providers:  IncredibleEmployment.be  This test is no t yet approved or cleared by the [place] FDA and  has been authorized for detection and/or diagnosis of SARS-CoV-2 by FDA under an Emergency Use Authorization (EUA). This EUA will remain  in effect (meaning this test can be used) for the duration of the COVID-19 declaration under Section 564(b)(1) of the Act, 21 U.S.C.section 360bbb-3(b)(1), unless the authorization is terminated  or revoked sooner.       Influenza A by PCR NEGATIVE NEGATIVE Final   Influenza B by PCR NEGATIVE NEGATIVE Final    Comment: (NOTE) The Xpert Xpress SARS-CoV-2/FLU/RSV plus assay is intended as an aid in the diagnosis of influenza from Nasopharyngeal swab specimens and should not be used as a sole basis for treatment. Nasal washings and aspirates are unacceptable for Xpert Xpress SARS-CoV-2/FLU/RSV testing.  Fact Sheet for Patients: EntrepreneurPulse.com.au  Fact Sheet for  Healthcare Providers: IncredibleEmployment.be  This test is not yet approved or cleared by the [place] FDA and has been authorized for detection and/or diagnosis of SARS-CoV-2 by FDA under an Emergency Use Authorization (EUA). This EUA will remain in effect (meaning this test can be used) for the duration of the COVID-19 declaration under Section 564(b)(1) of the Act, 21 U.S.C. section 360bbb-3(b)(1), unless the authorization is terminated or revoked.  Performed at [facility], [address] [place], [place] [address]   MRSA PCR Screening     Status: None   Collection Time: 12/10/20  1:41 AM   Specimen: Nasal Mucosa; Nasopharyngeal  Result Value Ref Range Status   MRSA by PCR NEGATIVE NEGATIVE Final    Comment:        The GeneXpert MRSA Assay (FDA approved for NASAL specimens only), is one component of a comprehensive MRSA colonization surveillance program. It is not intended to diagnose MRSA infection nor to guide or monitor treatment for MRSA infections. Performed at [facility], [address], [place], [place] [address]      Scheduled Meds: . aspirin EC  81 mg Oral QHS  . Chlorhexidine Gluconate Cloth  6 each Topical Daily  .  cholecalciferol  5,000 Units Oral q AM  . ferrous sulfate  325 mg Oral q AM  . insulin aspart  0-5 Units Subcutaneous QHS  . insulin aspart  0-9 Units Subcutaneous TID WC  . lactulose  20 g Oral Daily  . loratadine  10 mg Oral q AM  . magnesium oxide  200 mg Oral Daily  . pantoprazole  40 mg Oral Daily  . pravastatin  20 mg Oral Daily   Continuous Infusions: . sodium chloride 125 mL/hr at 12/12/20 1201    Procedures/Studies: DG Chest Portable 1 View  Result Date: 12/09/2020 CLINICAL DATA:  Hypotension EXAM: PORTABLE CHEST 1 VIEW COMPARISON:  10/28/2020 FINDINGS: Single frontal view of the chest demonstrates a stable cardiac silhouette. Postsurgical changes from median sternotomy. No airspace disease, effusion, or  pneumothorax. No acute bony abnormalities. IMPRESSION: 1. Stable exam, no acute process. Electronically Signed   By: Randa Ngo M.D.   On: 12/09/2020 17:41    Orson Eva, DO  [facility]  If 7PM-7AM, please contact night-coverage [email or web address] Password [email or web address] 12/12/2020, 2:47 PM   LOS: 2 days

## 2020-12-13 DIAGNOSIS — I9589 Other hypotension: Secondary | ICD-10-CM

## 2020-12-13 LAB — GLUCOSE, CAPILLARY
Glucose-Capillary: 66 mg/dL — ABNORMAL LOW (ref 70–99)
Glucose-Capillary: 136 mg/dL — ABNORMAL HIGH (ref 70–99)
Glucose-Capillary: 179 mg/dL — ABNORMAL HIGH (ref 70–99)
Glucose-Capillary: 223 mg/dL — ABNORMAL HIGH (ref 70–99)
Glucose-Capillary: 254 mg/dL — ABNORMAL HIGH (ref 70–99)

## 2020-12-13 NOTE — Progress Notes (Addendum)
PROGRESS NOTE  [name] TDD:[id number] DOB: Jul 05, 1961 DOA: 12/09/2020 PCP: [name], MD  Brief History:  60 year old male with a history ofNAFLD/ALDcirrhosis, diabetes mellitus type 2, coronary artery disease, CKD stage III, diastolic CHF, hypertension, IDApresenting with lightheadedness and generalized malaise for approximately 2 days prior to this admission. The patient stated that he felt groggy and cloudy. He went to follow-up with his medical oncologist for his thrombocytopenia. He was noted to have systolic blood pressure in the 60s. As result, the patient was admitted for further evaluation. The patient denies any fevers, chills, chest pain, shortness breath, cough, hemoptysis, nausea, vomiting, diarrhea, abdominal pain, dysuria, hematuria. He did state that he has had decreased urine output for last few days prior to admission. He denies any headache, neck pain.The patient followed up with his hepatologist at [facility] on 12/02/2020 at which time he has nadolol was discontinued, and the patient was started on carvedilol 6.25 mg twice daily. In addition, the patient has been instructed to increase his torsemide to 60 mg twice daily during a cardiology follow-up visit on 11/18/2020. He was instructed to take it twice daily for 1 week, then decrease back to once daily. Unfortunately, he continued to take it twice daily. He was instructed to decrease back to once daily by his hepatologist at [facility] on 12/02/2020. Other than that, the patient denied any new medications. He stated that his appetite has been fair.  ED Course:Initial vital signs were temperature98.3 F, pulse 84, RR 22, BP 60/45 mmHg and O2 sat 94% on room air. The patient received 2 L of normal saline bolus in the emergency department which resulted in an increase of his systolic blood pressure to to the 90s.  Labwork:His urinalysis was normal. Lactic acid 1.4 and then 2.1 mmol/L. Coronavirus  and influenza PCR was negative. CBC showed a white count of 11.4 with 72% neutrophils, hemoglobin 12.1 g/dL with an MCV of 101.4 fL and platelets 73. PT 15.6 and INR 1.3. His sodium was 125, potassium 4.9, chloride 92 and CO2 20 mmol/L. Glucose 189, BUN 77 creatinine 4.00 mg/dL. LFTs show total protein of 6.2 and albumin of 3.1 g/dL. Total bilirubin slightly increased to 1.0 mg/dL. The rest of the hepatic functions are normal. Troponin was static and then 8 ng/L. Ammonia was 18 mol/L.  Assessment/Plan: Hypotension -secondary to volume depletion and recent change in his antihypertensive regimen for his liver cirrhosis -Check procalcitonin--0.11 -TSH--0.414 -A.m. cortisol--15.3 -PCT 0.11 -Continue IV fluids>>saline lock -Discontinuedmidodrine and octreotide which was started in ED -Holding torsemide which had been decreased back to 60 mg daily -BP overall improved although remains soft with SBP in mid 90s to low 100s -12/31--discussed with pt's hepatologist--[name] to hold coreg and diuretics for now--instruct patient to keep BP log after d/c and she will call patient next week to make any new changes  Acute on chronic renal failure--CKD 3B -Secondary to volume depletion and hemodynamic change -Baseline creatinine 1.6-1.9 -Patient presented with serum creatinine 4.00 -Continue IV WCBJSE>>GBTDVVOH>>6.07  Chronic diastolic CHF -He appears euvolemic clinically -Holding torsemide temporarily secondary to hypotension and soft BPs  Uncontrolled diabetes mellitus type 2 with hyperglycemia -12/10/2020 hemoglobin A1c 7.9 -d/cLantus in the setting of acute kidney injury and hypoglycemia -Continue NovoLog sliding scale during hospitalization  NAFLD/ALD Cirrhosis -Holding spironolactone and torsemide temporarily secondary to hypotension and acute kidney injury -Continue lactulose throughout hospitalization -Patient follows hepatology at [facility] -discussed with Dr.  Marcille Buffy, [facility] hepatologist  Coronary artery disease -No chest pain presently -Continue aspirin 81 mg daily -Holding carvedilol secondary to hypotension  Thrombocytopenia -Secondary to liver cirrhosis -Monitor for signs of bleeding -Hemoglobin remained stable with drop due to dilution -baseline Hgb 11-12     Status OV:FIEPPIRJJ  The patient will require care spanning > 2 midnights and should be moved to inpatient because:IV treatments appropriate due to intensity of illness or inability to take PO  Dispo: The patient is from:Home Anticipated d/c is to:SNF Anticipated d/c date is: 1 days Patient currently is medically stable to d/c.  Barrier for discharge is due to prolonged insurance authorization process for SNF placement        Family Communication:spouse updatedat bedside 12/13/20  Consultants:none  Code Status: FULL   DVT Prophylaxis: SCDs   Procedures: As Listed in Progress Note Above  Antibiotics: None     Subjective: Patient denies fevers, chills, headache, chest pain, dyspnea, nausea, vomiting, diarrhea, abdominal pain, dysuria, hematuria, hematochezia, and melena.   Objective: Vitals:   12/13/20 0400 12/13/20 0500 12/13/20 0600 12/13/20 0720  BP: 117/63 (!) 104/53 (!) 96/54   Pulse: 85 82 81 76  Resp: 16 18 19  (!) 23  Temp:    98.5 F (36.9 C)  TempSrc:    Oral  SpO2: 98% 96% 97% 95%  Weight:      Height:        Intake/Output Summary (Last 24 hours) at 12/13/2020 1126 Last data filed at 12/12/2020 2300 Gross per 24 hour  Intake 200 ml  Output 1550 ml  Net -1350 ml   Weight change:  Exam:   General:  Pt is alert, follows commands appropriately, not in acute distress  HEENT: No icterus, No thrush, No neck mass, [place]/AT  Cardiovascular: RRR, S1/S2, no rubs, no gallops  Respiratory: CTA bilaterally, no wheezing, no crackles, no rhonchi  Abdomen: Soft/+BS, non  tender, non distended, no guarding  Extremities: trace LE edema, No lymphangitis, No petechiae, No rashes, no synovitis   Data Reviewed: I have personally reviewed following labs and imaging studies Basic Metabolic Panel: Recent Labs  Lab 12/09/20 1730 12/10/20 0241 12/11/20 0508 12/12/20 0539  NA 125* 133* 138 132*  K 4.9 4.5 4.4 4.3  CL 92* 101 109 108  CO2 20* 21* 19* 19*  GLUCOSE 189* 85 67* 86  BUN 77* 70* 37* 27*  CREATININE 4.00* 3.09* 1.66* 1.38*  CALCIUM 8.1* 8.2* 8.4* 8.2*  MG  --   --  2.1 1.9   Liver Function Tests: Recent Labs  Lab 12/09/20 1730 12/10/20 0241  AST 26 24  ALT 20 18  ALKPHOS 54 47  BILITOT 1.2 1.1  PROT 6.2* 6.7  ALBUMIN 3.1* 3.4*   No results for input(s): LIPASE, AMYLASE in the last 168 hours. Recent Labs  Lab 12/09/20 1907  AMMONIA 18   Coagulation Profile: Recent Labs  Lab 12/09/20 1907  INR 1.3*   CBC: Recent Labs  Lab 12/09/20 1730 12/10/20 0241 12/11/20 0508  WBC 11.4* 6.9 6.0  NEUTROABS 8.2*  --   --   HGB 12.1* 12.5* 11.6*  HCT 35.8* 36.8* 35.3*  MCV 101.4* 101.1* 103.8*  PLT 73* 52* 55*   Cardiac Enzymes: No results for input(s): CKTOTAL, CKMB, CKMBINDEX, TROPONINI in the last 168 hours. BNP: Invalid input(s): POCBNP CBG: Recent Labs  Lab 12/12/20 1142 12/12/20 1654 12/12/20 2041 12/13/20 0831 12/13/20 0949  GLUCAP 185* 309* 264* 66* 136*   HbA1C: No results for input(s): HGBA1C in the last  72 hours. Urine analysis:    Component Value Date/Time   COLORURINE YELLOW 12/09/2020 1700   APPEARANCEUR CLEAR 12/09/2020 1700   LABSPEC 1.008 12/09/2020 1700   PHURINE 5.0 12/09/2020 1700   GLUCOSEU NEGATIVE 12/09/2020 1700   HGBUR NEGATIVE 12/09/2020 1700   BILIRUBINUR NEGATIVE 12/09/2020 1700   KETONESUR NEGATIVE 12/09/2020 1700   PROTEINUR NEGATIVE 12/09/2020 1700   UROBILINOGEN 1.0 08/28/2015 1634   NITRITE NEGATIVE 12/09/2020 1700   LEUKOCYTESUR NEGATIVE 12/09/2020 1700   Sepsis  Labs: @LABRCNTIP (procalcitonin:4,lacticidven:4) ) Recent Results (from the past 240 hour(s))  Resp Panel by RT-PCR (Flu A&B, Covid) Nasopharyngeal Swab     Status: None   Collection Time: 12/09/20  5:30 PM   Specimen: Nasopharyngeal Swab; Nasopharyngeal(NP) swabs in vial transport medium  Result Value Ref Range Status   SARS Coronavirus 2 by RT PCR NEGATIVE NEGATIVE Final    Comment: (NOTE) SARS-CoV-2 target nucleic acids are NOT DETECTED.  The SARS-CoV-2 RNA is generally detectable in upper respiratory specimens during the acute phase of infection. The lowest concentration of SARS-CoV-2 viral copies this assay can detect is 138 copies/mL. A negative result does not preclude SARS-Cov-2 infection and should not be used as the sole basis for treatment or other patient management decisions. A negative result may occur with  improper specimen collection/handling, submission of specimen other than nasopharyngeal swab, presence of viral mutation(s) within the areas targeted by this assay, and inadequate number of viral copies(<138 copies/mL). A negative result must be combined with clinical observations, patient history, and epidemiological information. The expected result is Negative.  Fact Sheet for Patients:  EntrepreneurPulse.com.au  Fact Sheet for Healthcare Providers:  IncredibleEmployment.be  This test is no t yet approved or cleared by the [place] FDA and  has been authorized for detection and/or diagnosis of SARS-CoV-2 by FDA under an Emergency Use Authorization (EUA). This EUA will remain  in effect (meaning this test can be used) for the duration of the COVID-19 declaration under Section 564(b)(1) of the Act, 21 U.S.C.section 360bbb-3(b)(1), unless the authorization is terminated  or revoked sooner.       Influenza A by PCR NEGATIVE NEGATIVE Final   Influenza B by PCR NEGATIVE NEGATIVE Final    Comment: (NOTE) The Xpert Xpress  SARS-CoV-2/FLU/RSV plus assay is intended as an aid in the diagnosis of influenza from Nasopharyngeal swab specimens and should not be used as a sole basis for treatment. Nasal washings and aspirates are unacceptable for Xpert Xpress SARS-CoV-2/FLU/RSV testing.  Fact Sheet for Patients: EntrepreneurPulse.com.au  Fact Sheet for Healthcare Providers: IncredibleEmployment.be  This test is not yet approved or cleared by the [place] FDA and has been authorized for detection and/or diagnosis of SARS-CoV-2 by FDA under an Emergency Use Authorization (EUA). This EUA will remain in effect (meaning this test can be used) for the duration of the COVID-19 declaration under Section 564(b)(1) of the Act, 21 U.S.C. section 360bbb-3(b)(1), unless the authorization is terminated or revoked.  Performed at [facility], [address], [place], [place] [address]   MRSA PCR Screening     Status: None   Collection Time: 12/10/20  1:41 AM   Specimen: Nasal Mucosa; Nasopharyngeal  Result Value Ref Range Status   MRSA by PCR NEGATIVE NEGATIVE Final    Comment:        The GeneXpert MRSA Assay (FDA approved for NASAL specimens only), is one component of a comprehensive MRSA colonization surveillance program. It is not intended to diagnose MRSA infection nor to guide  or monitor treatment for MRSA infections. Performed at [facility], [address], [place], [place] [address]      Scheduled Meds: . aspirin EC  81 mg Oral QHS  . Chlorhexidine Gluconate Cloth  6 each Topical Daily  . cholecalciferol  5,000 Units Oral q AM  . ferrous sulfate  325 mg Oral q AM  . insulin aspart  0-5 Units Subcutaneous QHS  . insulin aspart  0-9 Units Subcutaneous TID WC  . lactulose  20 g Oral Daily  . loratadine  10 mg Oral q AM  . magnesium oxide  200 mg Oral Daily  . pantoprazole  40 mg Oral Daily  . pravastatin  20 mg Oral Daily   Continuous Infusions: . sodium  chloride 125 mL/hr at 12/13/20 0205    Procedures/Studies: DG Chest Portable 1 View  Result Date: 12/09/2020 CLINICAL DATA:  Hypotension EXAM: PORTABLE CHEST 1 VIEW COMPARISON:  10/28/2020 FINDINGS: Single frontal view of the chest demonstrates a stable cardiac silhouette. Postsurgical changes from median sternotomy. No airspace disease, effusion, or pneumothorax. No acute bony abnormalities. IMPRESSION: 1. Stable exam, no acute process. Electronically Signed   By: Randa Ngo M.D.   On: 12/09/2020 17:41    Orson Eva, DO  [facility]  If 7PM-7AM, please contact night-coverage [email or web address] Password [email or web address] 12/13/2020, 11:26 AM   LOS: 3 days

## 2020-12-13 NOTE — Progress Notes (Signed)
Pt blood sugar 64 at 0720.Pt was alert and oriented and given apple juice and rechecked a hour later and it was 66. Breakfast was being delivered and pt ate and rechecked it was 136.

## 2020-12-14 LAB — BASIC METABOLIC PANEL
Anion gap: 3 — ABNORMAL LOW (ref 5–15)
BUN: 20 mg/dL (ref 8–23)
CO2: 21 mmol/L — ABNORMAL LOW (ref 22–32)
Calcium: 8.4 mg/dL — ABNORMAL LOW (ref 8.9–10.3)
Chloride: 106 mmol/L (ref 98–111)
Creatinine, Ser: 1.26 mg/dL — ABNORMAL HIGH (ref 0.61–1.24)
GFR, Estimated: >60 mL/min (ref >60)
Glucose, Bld: 92 mg/dL (ref 70–99)
Potassium: 4.3 mmol/L (ref 3.5–5.1)
Sodium: 130 mmol/L — ABNORMAL LOW (ref 135–145)

## 2020-12-14 LAB — GLUCOSE, CAPILLARY
Glucose-Capillary: 81 mg/dL (ref 70–99)
Glucose-Capillary: 167 mg/dL — ABNORMAL HIGH (ref 70–99)
Glucose-Capillary: 207 mg/dL — ABNORMAL HIGH (ref 70–99)
Glucose-Capillary: 222 mg/dL — ABNORMAL HIGH (ref 70–99)

## 2020-12-14 LAB — MAGNESIUM: Magnesium: 1.7 mg/dL (ref 1.7–2.4)

## 2020-12-14 MED ORDER — MAGNESIUM OXIDE 400 (241.3 MG) MG PO TABS
400.0000 mg | ORAL_TABLET | Freq: Every day | ORAL | Status: DC
  Administered 2020-12-15 – 2020-12-16 (×2): 400 mg via ORAL
  Filled 2020-12-14 (×2): qty 1

## 2020-12-14 NOTE — Progress Notes (Signed)
PROGRESS NOTE  [name] TMH:[id number] DOB: [birth year] DOA: 12/09/2020 PCP: [name], MD  Brief History:  60 year old male with a history ofNAFLD/ALDcirrhosis, diabetes mellitus type 2, coronary artery disease, CKD stage III, diastolic CHF, hypertension, IDApresenting with lightheadedness and generalized malaise for approximately 2 days prior to this admission. The patient stated that he felt groggy and cloudy. He went to follow-up with his medical oncologist for his thrombocytopenia. He was noted to have systolic blood pressure in the 60s. As result, the patient was admitted for further evaluation. The patient denies any fevers, chills, chest pain, shortness breath, cough, hemoptysis, nausea, vomiting, diarrhea, abdominal pain, dysuria, hematuria. He did state that he has had decreased urine output for last few days prior to admission. He denies any headache, neck pain.The patient followed up with his hepatologist at [facility] on 12/02/2020 at which time he has nadolol was discontinued, and the patient was started on carvedilol 6.25 mg twice daily. In addition, the patient has been instructed to increase his torsemide to 60 mg twice daily during a cardiology follow-up visit on 11/18/2020. He was instructed to take it twice daily for 1 week, then decrease back to once daily. Unfortunately, he continued to take it twice daily. He was instructed to decrease back to once daily by his hepatologist at [facility] on 12/02/2020. Other than that, the patient denied any new medications. He stated that his appetite has been fair.  ED Course:Initial vital signs were temperature98.3 F, pulse 84, RR 22, BP 60/45 mmHg and O2 sat 94% on room air. The patient received 2 L of normal saline bolus in the emergency department which resulted in an increase of his systolic blood pressure to to the 90s.  Labwork:His urinalysis was normal. Lactic acid 1.4 and then 2.1 mmol/L. Coronavirus  and influenza PCR was negative. CBC showed a white count of 11.4 with 72% neutrophils, hemoglobin 12.1 g/dL with an MCV of 101.4 fL and platelets 73. PT 15.6 and INR 1.3. His sodium was 125, potassium 4.9, chloride 92 and CO2 20 mmol/L. Glucose 189, BUN 77 creatinine 4.00 mg/dL. LFTs show total protein of 6.2 and albumin of 3.1 g/dL. Total bilirubin slightly increased to 1.0 mg/dL. The rest of the hepatic functions are normal. Troponin was static and then 8 ng/L. Ammonia was 18 mol/L.  Assessment/Plan: Hypotension -secondary to volume depletion and recent change in his antihypertensive regimen for his liver cirrhosis -Check procalcitonin--0.11 -TSH--0.414 -A.m. cortisol--15.3 -PCT 0.11 -Continue IV fluids>>saline lock -Discontinuedmidodrine and octreotide which was started in ED -Holding torsemide which hadbeen decreased back to 60 mg daily -BP overall improved although remains soft with SBP in mid 90s to low 100s -12/31--discussed with pt's hepatologist--[name] to hold coreg and diuretics for now--instruct patient to keep BP log after d/c and she will call patient next week to make any new changes  Acute on chronic renal failure--CKD 3B -Secondary to volume depletion and hemodynamic change -Baseline creatinine 1.6-1.9 -Patient presented with serum creatinine 4.00 -Continue IV XQJJHE>>RDEYCXKG>>8.18  Chronic diastolic CHF -He appears euvolemic clinically -Holding torsemide temporarily secondary to hypotension and soft BPs  Uncontrolled diabetes mellitus type 2 with hyperglycemia -12/10/2020 hemoglobin A1c 7.9 -d/cLantus in the setting of acute kidney injury and hypoglycemia -Continue NovoLog sliding scale during hospitalization  NAFLD/ALD Cirrhosis -Holding spironolactone and torsemide temporarily secondary to hypotension and acute kidney injury -Continue lactulose throughout hospitalization -Patient follows hepatology at [facility] -discussed with Dr.  Marcille Buffy, [place] hepatologist  Coronary  artery disease -No chest pain presently -Continue aspirin 81 mg daily -Holding carvedilol secondary to hypotension  Thrombocytopenia -Secondary to liver cirrhosis -Monitor for signs of bleeding -Hemoglobin remained stable with drop due to dilution -baseline Hgb 11-12     Status XK:GYJEHUDJS  The patient will require care spanning > 2 midnights and should be moved to inpatient because:IV treatments appropriate due to intensity of illness or inability to take PO  Dispo: The patient is from:Home Anticipated d/c is to:SNF Anticipated d/c date is:12/15/20 Patient currently is medically stable to d/c.  Barrier for discharge is due to prolonged insurance authorization process for SNF placement        Family Communication:spouse updatedat bedside1/1/22  Consultants:none  Code Status: FULL   DVT Prophylaxis: SCDs   Procedures: As Listed in Progress Note Above  Antibiotics: None    Subjective: Patient denies fevers, chills, headache, chest pain, dyspnea, nausea, vomiting, diarrhea, abdominal pain, dysuria, hematuria, hematochezia, and melena.   Objective: Vitals:   12/13/20 1342 12/13/20 2107 12/14/20 0146 12/14/20 0559  BP: 116/73 108/77 (!) 105/56 106/69  Pulse: 89 90 94 95  Resp: 20 20 19 18   Temp: 98.2 F (36.8 C) 98.9 F (37.2 C) 98.6 F (37 C) 98.9 F (37.2 C)  TempSrc: Oral Oral Oral Oral  SpO2: 100% 100% 97% 96%  Weight:      Height:        Intake/Output Summary (Last 24 hours) at 12/14/2020 1631 Last data filed at 12/14/2020 1112 Gross per 24 hour  Intake --  Output 1003 ml  Net -1003 ml   Weight change:  Exam:   General:  Pt is alert, follows commands appropriately, not in acute distress  HEENT: No icterus, No thrush, No neck mass, [place]/AT  Cardiovascular: RRR, S1/S2, no rubs, no gallops  Respiratory: bibasilar crackles. No  wheeze  Abdomen: Soft/+BS, non tender, non distended, no guarding  Extremities: trace LE edema, No lymphangitis, No petechiae, No rashes, no synovitis   Data Reviewed: I have personally reviewed following labs and imaging studies Basic Metabolic Panel: Recent Labs  Lab 12/09/20 1730 12/10/20 0241 12/11/20 0508 12/12/20 0539 12/14/20 0643  NA 125* 133* 138 132* 130*  K 4.9 4.5 4.4 4.3 4.3  CL 92* 101 109 108 106  CO2 20* 21* 19* 19* 21*  GLUCOSE 189* 85 67* 86 92  BUN 77* 70* 37* 27* 20  CREATININE 4.00* 3.09* 1.66* 1.38* 1.26*  CALCIUM 8.1* 8.2* 8.4* 8.2* 8.4*  MG  --   --  2.1 1.9 1.7   Liver Function Tests: Recent Labs  Lab 12/09/20 1730 12/10/20 0241  AST 26 24  ALT 20 18  ALKPHOS 54 47  BILITOT 1.2 1.1  PROT 6.2* 6.7  ALBUMIN 3.1* 3.4*   No results for input(s): LIPASE, AMYLASE in the last 168 hours. Recent Labs  Lab 12/09/20 1907  AMMONIA 18   Coagulation Profile: Recent Labs  Lab 12/09/20 1907  INR 1.3*   CBC: Recent Labs  Lab 12/09/20 1730 12/10/20 0241 12/11/20 0508  WBC 11.4* 6.9 6.0  NEUTROABS 8.2*  --   --   HGB 12.1* 12.5* 11.6*  HCT 35.8* 36.8* 35.3*  MCV 101.4* 101.1* 103.8*  PLT 73* 52* 55*   Cardiac Enzymes: No results for input(s): CKTOTAL, CKMB, CKMBINDEX, TROPONINI in the last 168 hours. BNP: Invalid input(s): POCBNP CBG: Recent Labs  Lab 12/13/20 1622 12/13/20 2150 12/14/20 0753 12/14/20 1156 12/14/20 1614  GLUCAP 223* 254* 81 167* 207*   HbA1C:  No results for input(s): HGBA1C in the last 72 hours. Urine analysis:    Component Value Date/Time   COLORURINE YELLOW 12/09/2020 1700   APPEARANCEUR CLEAR 12/09/2020 1700   LABSPEC 1.008 12/09/2020 1700   PHURINE 5.0 12/09/2020 1700   GLUCOSEU NEGATIVE 12/09/2020 1700   HGBUR NEGATIVE 12/09/2020 1700   BILIRUBINUR NEGATIVE 12/09/2020 1700   KETONESUR NEGATIVE 12/09/2020 1700   PROTEINUR NEGATIVE 12/09/2020 1700   UROBILINOGEN 1.0 08/28/2015 1634   NITRITE  NEGATIVE 12/09/2020 1700   LEUKOCYTESUR NEGATIVE 12/09/2020 1700   Sepsis Labs: @LABRCNTIP (procalcitonin:4,lacticidven:4) ) Recent Results (from the past 240 hour(s))  Resp Panel by RT-PCR (Flu A&B, Covid) Nasopharyngeal Swab     Status: None   Collection Time: 12/09/20  5:30 PM   Specimen: Nasopharyngeal Swab; Nasopharyngeal(NP) swabs in vial transport medium  Result Value Ref Range Status   SARS Coronavirus 2 by RT PCR NEGATIVE NEGATIVE Final    Comment: (NOTE) SARS-CoV-2 target nucleic acids are NOT DETECTED.  The SARS-CoV-2 RNA is generally detectable in upper respiratory specimens during the acute phase of infection. The lowest concentration of SARS-CoV-2 viral copies this assay can detect is 138 copies/mL. A negative result does not preclude SARS-Cov-2 infection and should not be used as the sole basis for treatment or other patient management decisions. A negative result may occur with  improper specimen collection/handling, submission of specimen other than nasopharyngeal swab, presence of viral mutation(s) within the areas targeted by this assay, and inadequate number of viral copies(<138 copies/mL). A negative result must be combined with clinical observations, patient history, and epidemiological information. The expected result is Negative.  Fact Sheet for Patients:  EntrepreneurPulse.com.au  Fact Sheet for Healthcare Providers:  IncredibleEmployment.be  This test is no t yet approved or cleared by the [place] FDA and  has been authorized for detection and/or diagnosis of SARS-CoV-2 by FDA under an Emergency Use Authorization (EUA). This EUA will remain  in effect (meaning this test can be used) for the duration of the COVID-19 declaration under Section 564(b)(1) of the Act, 21 U.S.C.section 360bbb-3(b)(1), unless the authorization is terminated  or revoked sooner.       Influenza A by PCR NEGATIVE NEGATIVE Final    Influenza B by PCR NEGATIVE NEGATIVE Final    Comment: (NOTE) The Xpert Xpress SARS-CoV-2/FLU/RSV plus assay is intended as an aid in the diagnosis of influenza from Nasopharyngeal swab specimens and should not be used as a sole basis for treatment. Nasal washings and aspirates are unacceptable for Xpert Xpress SARS-CoV-2/FLU/RSV testing.  Fact Sheet for Patients: EntrepreneurPulse.com.au  Fact Sheet for Healthcare Providers: IncredibleEmployment.be  This test is not yet approved or cleared by the [place] FDA and has been authorized for detection and/or diagnosis of SARS-CoV-2 by FDA under an Emergency Use Authorization (EUA). This EUA will remain in effect (meaning this test can be used) for the duration of the COVID-19 declaration under Section 564(b)(1) of the Act, 21 U.S.C. section 360bbb-3(b)(1), unless the authorization is terminated or revoked.  Performed at [facility], [address], [place], [place] [address]   MRSA PCR Screening     Status: None   Collection Time: 12/10/20  1:41 AM   Specimen: Nasal Mucosa; Nasopharyngeal  Result Value Ref Range Status   MRSA by PCR NEGATIVE NEGATIVE Final    Comment:        The GeneXpert MRSA Assay (FDA approved for NASAL specimens only), is one component of a comprehensive MRSA colonization surveillance program. It is not  intended to diagnose MRSA infection nor to guide or monitor treatment for MRSA infections. Performed at [facility], [address], [place], [place] [address]      Scheduled Meds: . aspirin EC  81 mg Oral QHS  . Chlorhexidine Gluconate Cloth  6 each Topical Daily  . cholecalciferol  5,000 Units Oral q AM  . ferrous sulfate  325 mg Oral q AM  . insulin aspart  0-5 Units Subcutaneous QHS  . insulin aspart  0-9 Units Subcutaneous TID WC  . lactulose  20 g Oral Daily  . loratadine  10 mg Oral q AM  . magnesium oxide  200 mg Oral Daily  . pantoprazole  40 mg Oral  Daily  . pravastatin  20 mg Oral Daily   Continuous Infusions:  Procedures/Studies: DG Chest Portable 1 View  Result Date: 12/09/2020 CLINICAL DATA:  Hypotension EXAM: PORTABLE CHEST 1 VIEW COMPARISON:  10/28/2020 FINDINGS: Single frontal view of the chest demonstrates a stable cardiac silhouette. Postsurgical changes from median sternotomy. No airspace disease, effusion, or pneumothorax. No acute bony abnormalities. IMPRESSION: 1. Stable exam, no acute process. Electronically Signed   By: Randa Ngo M.D.   On: 12/09/2020 17:41    Orson Eva, DO  [facility]  If 7PM-7AM, please contact night-coverage [email or web address] Password [email or web address] 12/14/2020, 4:31 PM   LOS: 4 days

## 2020-12-14 NOTE — Discharge Summary (Signed)
Physician Discharge Summary  [name] ZSW:[id number] DOB: [birth year] DOA: 12/09/2020  PCP: [name], MD  Admit date: 12/09/2020 Discharge date: 12/16/2020  Admitted From: Home Disposition:  SNF  Recommendations for Outpatient Follow-up:  1. Follow up with PCP in 1-2 weeks 2. Please obtain BMP/CBC in one week     Discharge Condition: Stable CODE STATUS: FULL Diet recommendation: Heart Healthy / Carb Modified  Brief/Interim Summary: 60 year old male with a history ofNAFLD/ALDcirrhosis, diabetes mellitus type 2, coronary artery disease, CKD stage III, diastolic CHF, hypertension, IDApresenting with lightheadedness and generalized malaise for approximately 2 days prior to this admission. The patient stated that he felt groggy and cloudy. He went to follow-up with his medical oncologist for his thrombocytopenia. He was noted to have systolic blood pressure in the 60s. As result, the patient was admitted for further evaluation. The patient denies any fevers, chills, chest pain, shortness breath, cough, hemoptysis, nausea, vomiting, diarrhea, abdominal pain, dysuria, hematuria. He did state that he has had decreased urine output for last few days prior to admission. He denies any headache, neck pain.The patient followed up with his hepatologist at [facility] on 12/02/2020 at which time he has nadolol was discontinued, and the patient was started on carvedilol 6.25 mg twice daily. In addition, the patient has been instructed to increase his torsemide to 60 mg twice daily during a cardiology follow-up visit on 11/18/2020. He was instructed to take it twice daily for 1 week, then decrease back to once daily. Unfortunately, he continued to take it twice daily. He was instructed to decrease back to once daily by his hepatologist at [facility] on 12/02/2020. Other than that, the patient denied any new medications. He stated that his appetite has been fair.  ED Course:Initial vital  signs were temperature98.3 F, pulse 84, RR 22, BP 60/45 mmHg and O2 sat 94% on room air. The patient received 2 L of normal saline bolus in the emergency department which resulted in an increase of his systolic blood pressure to to the 90s.  Labwork:His urinalysis was normal. Lactic acid 1.4 and then 2.1 mmol/L. Coronavirus and influenza PCR was negative. CBC showed a white count of 11.4 with 72% neutrophils, hemoglobin 12.1 g/dL with an MCV of 101.4 fL and platelets 73. PT 15.6 and INR 1.3. His sodium was 125, potassium 4.9, chloride 92 and CO2 20 mmol/L. Glucose 189, BUN 77 creatinine 4.00 mg/dL. LFTs show total protein of 6.2 and albumin of 3.1 g/dL. Total bilirubin slightly increased to 1.0 mg/dL. The rest of the hepatic functions are normal. Troponin was static and then 8 ng/L. Ammonia was 18 mol/L.  Discharge Diagnoses:  Hypotension -secondary to volume depletion and recent change in his antihypertensive regimen for his liver cirrhosis -Check procalcitonin--0.11 -TSH--0.414 -A.m. cortisol--15.3 -PCT 0.11 -Continue IV fluids>>saline lock -Discontinuedmidodrine and octreotidewhich was started in ED -Holding torsemide which hadbeen decreased back to 60 mg daily -BPoverall improved although remains soft with SBP in low 100s -12/31--discussed with pt's hepatologist--[name] to hold coreg and diuretics for now--instruct patient to keep BP log after d/c and she will call patient next week to make any new changes  Acute on chronic renal failure--CKD 3B -Secondary to volume depletion and hemodynamic change -Baseline creatinine 1.6-1.9 -Patient presented with serum creatinine 4.00 -Continue IV fluids>>improved>>1.26>>1.19 on day of d/c  Chronic diastolic CHF -He appears euvolemic clinically -Holding torsemide temporarily secondary to hypotensionand soft BPs-->restart home dose after d/c  Uncontrolled diabetes mellitus type 2 with hyperglycemia -12/10/2020  hemoglobin A1c 7.9 -  d/cLantus in the setting of acute kidney injury and hypoglycemia -Continue NovoLog sliding scaleduring hospitalization -restart tresiba at lower dose after d/c-->10 units at hs -[facility] updated about pt on 12/16/20  NAFLD/ALD Cirrhosis -Holding spironolactone and torsemide temporarily secondary to hypotension and acute kidney injury -Continue lactulosethroughout hospitalization -Patient follows hepatology at [facility] -discussed with [name], [place] hepatologist -as BP is slowly improving-->restart torsemide and spironolactone after d/c-->would check BMP in one week  Coronary artery disease -No chest pain presently -Continue aspirin 81 mg daily -Holding carvedilol secondary to hypotension  Thrombocytopenia -Secondary to liver cirrhosis -plt = 85K on day of dc -Monitor for signs of bleeding -Hemoglobin remained stable -baseline Hgb 11-12   Discharge Instructions   Allergies as of 12/16/2020      Reactions   Fluticasone Rash      Medication List    STOP taking these medications   carvedilol 3.125 MG tablet Commonly known as: Coreg     TAKE these medications   albuterol 108 (90 Base) MCG/ACT inhaler Commonly known as: VENTOLIN HFA Inhale 2 puffs into the lungs every 6 (six) hours as needed for wheezing or shortness of breath.   ALPRAZolam 0.5 MG tablet Commonly known as: XANAX Take 0.5 mg by mouth daily as needed.   Armodafinil 200 MG Tabs Take 200 mg by mouth daily as needed (for sleepiness).   aspirin EC 81 MG tablet Take 81 mg by mouth at bedtime.   Dialyvite Vitamin D 5000 125 MCG (5000 UT) capsule Generic drug: Cholecalciferol Take 5,000 Units by mouth in the morning.   ELDERBERRY PO Take by mouth at bedtime. With  Vitamin c and zinc   ferrous sulfate 325 (65 FE) MG tablet Take 325 mg by mouth in the morning.   FLAXSEED OIL PO Take 1,400 mg by mouth at bedtime.   FreeStyle Libre 14 Day Reader [name] 1  reader   [employer] 14 Day Sensor Misc 1 sensor   INS SYRINGE/NEEDLE .5CC/28G 28G X 1/2" 0.5 ML Misc See admin instructions.   insulin aspart 100 UNIT/ML injection Commonly known as: novoLOG Inject 5-30 Units into the skin 3 (three) times daily before meals.   lactulose 10 GM/15ML solution Commonly known as: CHRONULAC Take 30 mLs (20 g total) by mouth daily.   loratadine 10 MG tablet Commonly known as: CLARITIN Take 10 mg by mouth in the morning.   Magnesium 250 MG Tabs Take 250 mg by mouth daily.   Medical Compression Stockings Misc 1 each by Does not apply route as directed. Low pressure knee high compression stockings Dx: leg swelling   omeprazole 40 MG capsule Commonly known as: PRILOSEC Take 40 mg by mouth at bedtime.   Ozempic (1 MG/DOSE) 4 MG/3ML Sopn Generic drug: Semaglutide (1 MG/DOSE) Inject 1 mg into the skin once a week.   pravastatin 20 MG tablet Commonly known as: PRAVACHOL Take 20 mg by mouth daily.   spironolactone 100 MG tablet Commonly known as: ALDACTONE Take 0.5 tablets (50 mg total) by mouth daily.   torsemide 20 MG tablet Commonly known as: DEMADEX Take 60 mg by mouth daily.   Tyler Aas FlexTouch 200 UNIT/ML FlexTouch Pen Generic drug: insulin degludec Inject 10 Units into the skin at bedtime. Inject 70 units into the skin in the morning and 40 units at bedtime What changed:   how much to take  when to take this   TURMERIC CURCUMIN PO Take 2,000 mg by mouth at bedtime.  Contact information for follow-up providers    [facility] Follow up.   Specialty: Rehabilitation Contact information: [place] [id number] [place] [phone]           Contact information for after-discharge care    [facility] .   Service: Skilled Nursing Contact information: [address] [place]  [address] [phone]                 Allergies  Allergen Reactions  . Fluticasone Rash    Consultations:  none   Procedures/Studies: DG Chest Portable 1 View  Result Date: 12/09/2020 CLINICAL DATA:  Hypotension EXAM: PORTABLE CHEST 1 VIEW COMPARISON:  10/28/2020 FINDINGS: Single frontal view of the chest demonstrates a stable cardiac silhouette. Postsurgical changes from median sternotomy. No airspace disease, effusion, or pneumothorax. No acute bony abnormalities. IMPRESSION: 1. Stable exam, no acute process. Electronically Signed   By: Randa Ngo M.D.   On: 12/09/2020 17:41        Discharge Exam: Vitals:   12/16/20 0514 12/16/20 1440  BP: 94/64 121/74  Pulse: 83 91  Resp: 18 16  Temp: 97.8 F (36.6 C) (!) 97.5 F (36.4 C)  SpO2: 97% 100%   Vitals:   12/15/20 1259 12/15/20 2037 12/16/20 0514 12/16/20 1440  BP: 94/66 102/71 94/64 121/74  Pulse: 91 89 83 91  Resp: 18 17 18 16   Temp: 97.9 F (36.6 C) 97.9 F (36.6 C) 97.8 F (36.6 C) (!) 97.5 F (36.4 C)  TempSrc: Oral Oral Oral Oral  SpO2: 96% 100% 97% 100%  Weight:      Height:        General: Pt is alert, awake, not in acute distress Cardiovascular: RRR, S1/S2 +, no rubs, no gallops Respiratory: CTA bilaterally, no wheezing, no rhonchi Abdominal: Soft, NT, ND, bowel sounds + Extremities: trace LE edema, no cyanosis   The results of significant diagnostics from this hospitalization (including imaging, microbiology, ancillary and laboratory) are listed below for reference.    Significant Diagnostic Studies: DG Chest Portable 1 View  Result Date: 12/09/2020 CLINICAL DATA:  Hypotension EXAM: PORTABLE CHEST 1 VIEW COMPARISON:  10/28/2020 FINDINGS: Single frontal view of the chest demonstrates a stable cardiac silhouette. Postsurgical changes from median sternotomy. No airspace disease, effusion, or pneumothorax. No acute bony abnormalities. IMPRESSION: 1. Stable exam, no acute process.  Electronically Signed   By: Randa Ngo M.D.   On: 12/09/2020 17:41     Microbiology: Recent Results (from the past 240 hour(s))  Resp Panel by RT-PCR (Flu A&B, Covid) Nasopharyngeal Swab     Status: None   Collection Time: 12/09/20  5:30 PM   Specimen: Nasopharyngeal Swab; Nasopharyngeal(NP) swabs in vial transport medium  Result Value Ref Range Status   SARS Coronavirus 2 by RT PCR NEGATIVE NEGATIVE Final    Comment: (NOTE) SARS-CoV-2 target nucleic acids are NOT DETECTED.  The SARS-CoV-2 RNA is generally detectable in upper respiratory specimens during the acute phase of infection. The lowest concentration of SARS-CoV-2 viral copies this assay can detect is 138 copies/mL. A negative result does not preclude SARS-Cov-2 infection and should not be used as the sole basis for treatment or other patient management decisions. A negative result may occur with  improper specimen collection/handling, submission of specimen other than nasopharyngeal swab, presence of viral mutation(s) within the areas targeted by this assay, and inadequate number of viral copies(<138 copies/mL).  A negative result must be combined with clinical observations, patient history, and epidemiological information. The expected result is Negative.  Fact Sheet for Patients:  EntrepreneurPulse.com.au  Fact Sheet for Healthcare Providers:  IncredibleEmployment.be  This test is no t yet approved or cleared by the [place] FDA and  has been authorized for detection and/or diagnosis of SARS-CoV-2 by FDA under an Emergency Use Authorization (EUA). This EUA will remain  in effect (meaning this test can be used) for the duration of the COVID-19 declaration under Section 564(b)(1) of the Act, 21 U.S.C.section 360bbb-3(b)(1), unless the authorization is terminated  or revoked sooner.       Influenza A by PCR NEGATIVE NEGATIVE Final   Influenza B by PCR NEGATIVE NEGATIVE  Final    Comment: (NOTE) The Xpert Xpress SARS-CoV-2/FLU/RSV plus assay is intended as an aid in the diagnosis of influenza from Nasopharyngeal swab specimens and should not be used as a sole basis for treatment. Nasal washings and aspirates are unacceptable for Xpert Xpress SARS-CoV-2/FLU/RSV testing.  Fact Sheet for Patients: EntrepreneurPulse.com.au  Fact Sheet for Healthcare Providers: IncredibleEmployment.be  This test is not yet approved or cleared by the [place] FDA and has been authorized for detection and/or diagnosis of SARS-CoV-2 by FDA under an Emergency Use Authorization (EUA). This EUA will remain in effect (meaning this test can be used) for the duration of the COVID-19 declaration under Section 564(b)(1) of the Act, 21 U.S.C. section 360bbb-3(b)(1), unless the authorization is terminated or revoked.  Performed at [facility], [address] [place], [place] [address]   MRSA PCR Screening     Status: None   Collection Time: 12/10/20  1:41 AM   Specimen: Nasal Mucosa; Nasopharyngeal  Result Value Ref Range Status   MRSA by PCR NEGATIVE NEGATIVE Final    Comment:        The GeneXpert MRSA Assay (FDA approved for NASAL specimens only), is one component of a comprehensive MRSA colonization surveillance program. It is not intended to diagnose MRSA infection nor to guide or monitor treatment for MRSA infections. Performed at [facility], [address] [place], [place] [address]   SARS CORONAVIRUS 2 (Narek Kniss 6-24 HRS) Nasopharyngeal Nasopharyngeal Swab     Status: None   Collection Time: 12/15/20  9:42 AM   Specimen: Nasopharyngeal Swab  Result Value Ref Range Status   SARS Coronavirus 2 NEGATIVE NEGATIVE Final    Comment: (NOTE) SARS-CoV-2 target nucleic acids are NOT DETECTED.  The SARS-CoV-2 RNA is generally detectable in upper and lower respiratory specimens during the acute phase of infection. Negative results do  not preclude SARS-CoV-2 infection, do not rule out co-infections with other pathogens, and should not be used as the sole basis for treatment or other patient management decisions. Negative results must be combined with clinical observations, patient history, and epidemiological information. The expected result is Negative.  Fact Sheet for Patients: SugarRoll.be  Fact Sheet for Healthcare Providers: https://www.woods-mathews.com/  This test is not yet approved or cleared by the [place] FDA and  has been authorized for detection and/or diagnosis of SARS-CoV-2 by FDA under an Emergency Use Authorization (EUA). This EUA will remain  in effect (meaning this test can be used) for the duration of the COVID-19 declaration under Se ction 564(b)(1) of the Act, 21 U.S.C. section 360bbb-3(b)(1), unless the authorization is terminated or revoked sooner.  Performed at [facility], [place] [address], [place], [place] [address]      Labs: Basic Metabolic Panel:  Recent Labs  Lab 12/10/20 0241 12/11/20 0508 12/12/20 0539 12/14/20 0643 12/16/20 0427  NA 133* 138 132* 130* 132*  K 4.5 4.4 4.3 4.3 3.9  CL 101 109 108 106 103  CO2 21* 19* 19* 21* 21*  GLUCOSE 85 67* 86 92 155*  BUN 70* 37* 27* 20 26*  CREATININE 3.09* 1.66* 1.38* 1.26* 1.19  CALCIUM 8.2* 8.4* 8.2* 8.4* 8.8*  MG  --  2.1 1.9 1.7  --    Liver Function Tests: Recent Labs  Lab 12/09/20 1730 12/10/20 0241  AST 26 24  ALT 20 18  ALKPHOS 54 47  BILITOT 1.2 1.1  PROT 6.2* 6.7  ALBUMIN 3.1* 3.4*   No results for input(s): LIPASE, AMYLASE in the last 168 hours. Recent Labs  Lab 12/09/20 1907  AMMONIA 18   CBC: Recent Labs  Lab 12/09/20 1730 12/10/20 0241 12/11/20 0508 12/16/20 0427  WBC 11.4* 6.9 6.0 5.9  NEUTROABS 8.2*  --   --   --   HGB 12.1* 12.5* 11.6* 11.3*  HCT 35.8* 36.8* 35.3* 33.4*  MCV 101.4* 101.1* 103.8* 98.5  PLT 73* 52* 55* 85*   Cardiac  Enzymes: No results for input(s): CKTOTAL, CKMB, CKMBINDEX, TROPONINI in the last 168 hours. BNP: Invalid input(s): POCBNP CBG: Recent Labs  Lab 12/15/20 1136 12/15/20 1644 12/15/20 2043 12/16/20 0738 12/16/20 1116  GLUCAP 183* 195* 264* 97 246*    Time coordinating discharge:  36 minutes  Signed:  Orson Eva, DO [facility] Pager: [phone] 12/16/2020, 4:05 PM

## 2020-12-15 LAB — GLUCOSE, CAPILLARY
Glucose-Capillary: 64 mg/dL — ABNORMAL LOW (ref 70–99)
Glucose-Capillary: 180 mg/dL — ABNORMAL HIGH (ref 70–99)
Glucose-Capillary: 183 mg/dL — ABNORMAL HIGH (ref 70–99)
Glucose-Capillary: 195 mg/dL — ABNORMAL HIGH (ref 70–99)
Glucose-Capillary: 264 mg/dL — ABNORMAL HIGH (ref 70–99)

## 2020-12-15 NOTE — Progress Notes (Signed)
PROGRESS NOTE  [name] VVO:[id number] DOB: 1961/07/25 DOA: 12/09/2020 PCP: [name], MD  Brief History: 60 year old male with a history ofNAFLD/ALDcirrhosis, diabetes mellitus type 2, coronary artery disease, CKD stage III, diastolic CHF, hypertension, IDApresenting with lightheadedness and generalized malaise for approximately 2 days prior to this admission. The patient stated that he felt groggy and cloudy. He went to follow-up with his medical oncologist for his thrombocytopenia. He was noted to have systolic blood pressure in the 60s. As result, the patient was admitted for further evaluation. The patient denies any fevers, chills, chest pain, shortness breath, cough, hemoptysis, nausea, vomiting, diarrhea, abdominal pain, dysuria, hematuria. He did state that he has had decreased urine output for last few days prior to admission. He denies any headache, neck pain.The patient followed up with his hepatologist at [facility] on 12/02/2020 at which time he has nadolol was discontinued, and the patient was started on carvedilol 6.25 mg twice daily. In addition, the patient has been instructed to increase his torsemide to 60 mg twice daily during a cardiology follow-up visit on 11/18/2020. He was instructed to take it twice daily for 1 week, then decrease back to once daily. Unfortunately, he continued to take it twice daily. He was instructed to decrease back to once daily by his hepatologist at [facility] on 12/02/2020. Other than that, the patient denied any new medications. He stated that his appetite has been fair.  ED Course:Initial vital signs were temperature98.3 F, pulse 84, RR 22, BP 60/45 mmHg and O2 sat 94% on room air. The patient received 2 L of normal saline bolus in the emergency department which resulted in an increase of his systolic blood pressure to to the 90s.  Labwork:His urinalysis was normal. Lactic acid 1.4 and then 2.1 mmol/L. Coronavirus  and influenza PCR was negative. CBC showed a white count of 11.4 with 72% neutrophils, hemoglobin 12.1 g/dL with an MCV of 101.4 fL and platelets 73. PT 15.6 and INR 1.3. His sodium was 125, potassium 4.9, chloride 92 and CO2 20 mmol/L. Glucose 189, BUN 77 creatinine 4.00 mg/dL. LFTs show total protein of 6.2 and albumin of 3.1 g/dL. Total bilirubin slightly increased to 1.0 mg/dL. The rest of the hepatic functions are normal. Troponin was static and then 8 ng/L. Ammonia was 18 mol/L.  Assessment/Plan: Hypotension -secondary to volume depletion and recent change in his antihypertensive regimen for his liver cirrhosis -Check procalcitonin--0.11 -TSH--0.414 -A.m. cortisol--15.3 -PCT 0.11 -Continue IV fluids>>saline lock -Discontinuedmidodrine and octreotidewhich was started in ED -Holding torsemide which hadbeen decreased back to 60 mg daily -BPoverall improved although remains soft with SBP in upper 90s to low 100s -12/31--discussed with pt's hepatologist--[name] to hold coreg and diuretics for now--instruct patient to keep BP log after d/c and she will call patient next week to make any new changes  Acute on chronic renal failure--CKD 3B -Secondary to volume depletion and hemodynamic change -Baseline creatinine 1.6-1.9 -Patient presented with serum creatinine 4.00 -Continue IV YIRSWN>>IOEVOJJK>>0.93  Chronic diastolic CHF -He appears euvolemic clinically -Holding torsemide temporarily secondary to hypotensionand soft BPs  Uncontrolled diabetes mellitus type 2 with hyperglycemia -12/10/2020 hemoglobin A1c 7.9 -d/cLantus in the setting of acute kidney injury and hypoglycemia -Continue NovoLog sliding scaleduring hospitalization  NAFLD/ALD Cirrhosis -Holding spironolactone and torsemide temporarily secondary to hypotension and acute kidney injury -Continue lactulosethroughout hospitalization -Patient follows hepatology at [facility] -discussed with Dr.  Marcille Buffy, [place] hepatologist  Coronary artery disease -No chest pain presently -  Continue aspirin 81 mg daily -Holding carvedilol secondary to hypotension  Thrombocytopenia -Secondary to liver cirrhosis -Monitor for signs of bleeding -Hemoglobin remained stablewith drop due to dilution -baseline Hgb 11-12     Status FX:[id number]  The patient will require care spanning > 2 midnights and should be moved to inpatient because:IV treatments appropriate due to intensity of illness or inability to take PO  Dispo: The patient is from:Home Anticipated d/c is to:SNF Anticipated d/c date is:12/15/20 Patient currently is medically stable to d/c.Barrier for discharge is due to prolonged insurance authorization process for SNF placement        Family Communication:spouse updatedat bedside1/2/22  Consultants:none  Code Status: FULL   DVT Prophylaxis: SCDs   Procedures: As Listed in Progress Note Above  Antibiotics: None      Subjective: Patient denies fevers, chills, headache, chest pain, dyspnea, nausea, vomiting, diarrhea, abdominal pain, dysuria, hematuria, hematochezia, and melena.   Objective: Vitals:   12/14/20 0559 12/14/20 2236 12/15/20 0449 12/15/20 1259  BP: 106/69 99/62 112/66 94/66  Pulse: 95 88 93 91  Resp: 18 18 18 18   Temp: 98.9 F (37.2 C) 99 F (37.2 C) 99 F (37.2 C) 97.9 F (36.6 C)  TempSrc: Oral Oral Oral Oral  SpO2: 96% 100% 99% 96%  Weight:      Height:        Intake/Output Summary (Last 24 hours) at 12/15/2020 1546 Last data filed at 12/15/2020 1300 Gross per 24 hour  Intake 720 ml  Output --  Net 720 ml   Weight change:  Exam:   General:  Pt is alert, follows commands appropriately, not in acute distress  HEENT: No icterus, No thrush, No neck mass, [place]/AT  Cardiovascular: RRR, S1/S2, no rubs, no gallops  Respiratory: fine bibasilar rales. No  wheeze  Abdomen: Soft/+BS, non tender, non distended, no guarding  Extremities: trace LE edema, No lymphangitis, No petechiae, No rashes, no synovitis   Data Reviewed: I have personally reviewed following labs and imaging studies Basic Metabolic Panel: Recent Labs  Lab 12/09/20 1730 12/10/20 0241 12/11/20 0508 12/12/20 0539 12/14/20 0643  NA 125* 133* 138 132* 130*  K 4.9 4.5 4.4 4.3 4.3  CL 92* 101 109 108 106  CO2 20* 21* 19* 19* 21*  GLUCOSE 189* 85 67* 86 92  BUN 77* 70* 37* 27* 20  CREATININE 4.00* 3.09* 1.66* 1.38* 1.26*  CALCIUM 8.1* 8.2* 8.4* 8.2* 8.4*  MG  --   --  2.1 1.9 1.7   Liver Function Tests: Recent Labs  Lab 12/09/20 1730 12/10/20 0241  AST 26 24  ALT 20 18  ALKPHOS 54 47  BILITOT 1.2 1.1  PROT 6.2* 6.7  ALBUMIN 3.1* 3.4*   No results for input(s): LIPASE, AMYLASE in the last 168 hours. Recent Labs  Lab 12/09/20 1907  AMMONIA 18   Coagulation Profile: Recent Labs  Lab 12/09/20 1907  INR 1.3*   CBC: Recent Labs  Lab 12/09/20 1730 12/10/20 0241 12/11/20 0508  WBC 11.4* 6.9 6.0  NEUTROABS 8.2*  --   --   HGB 12.1* 12.5* 11.6*  HCT 35.8* 36.8* 35.3*  MCV 101.4* 101.1* 103.8*  PLT 73* 52* 55*   Cardiac Enzymes: No results for input(s): CKTOTAL, CKMB, CKMBINDEX, TROPONINI in the last 168 hours. BNP: Invalid input(s): POCBNP CBG: Recent Labs  Lab 12/14/20 1156 12/14/20 1614 12/14/20 2236 12/15/20 0756 12/15/20 1136  GLUCAP 167* 207* 222* 180* 183*   HbA1C: No results for input(s): HGBA1C in the  last 72 hours. Urine analysis:    Component Value Date/Time   COLORURINE YELLOW 12/09/2020 1700   APPEARANCEUR CLEAR 12/09/2020 1700   LABSPEC 1.008 12/09/2020 1700   PHURINE 5.0 12/09/2020 1700   GLUCOSEU NEGATIVE 12/09/2020 1700   HGBUR NEGATIVE 12/09/2020 1700   BILIRUBINUR NEGATIVE 12/09/2020 1700   KETONESUR NEGATIVE 12/09/2020 1700   PROTEINUR NEGATIVE 12/09/2020 1700   UROBILINOGEN 1.0 08/28/2015 1634   NITRITE  NEGATIVE 12/09/2020 1700   LEUKOCYTESUR NEGATIVE 12/09/2020 1700   Sepsis Labs: @LABRCNTIP (procalcitonin:4,lacticidven:4) ) Recent Results (from the past 240 hour(s))  Resp Panel by RT-PCR (Flu A&B, Covid) Nasopharyngeal Swab     Status: None   Collection Time: 12/09/20  5:30 PM   Specimen: Nasopharyngeal Swab; Nasopharyngeal(NP) swabs in vial transport medium  Result Value Ref Range Status   SARS Coronavirus 2 by RT PCR NEGATIVE NEGATIVE Final    Comment: (NOTE) SARS-CoV-2 target nucleic acids are NOT DETECTED.  The SARS-CoV-2 RNA is generally detectable in upper respiratory specimens during the acute phase of infection. The lowest concentration of SARS-CoV-2 viral copies this assay can detect is 138 copies/mL. A negative result does not preclude SARS-Cov-2 infection and should not be used as the sole basis for treatment or other patient management decisions. A negative result may occur with  improper specimen collection/handling, submission of specimen other than nasopharyngeal swab, presence of viral mutation(s) within the areas targeted by this assay, and inadequate number of viral copies(<138 copies/mL). A negative result must be combined with clinical observations, patient history, and epidemiological information. The expected result is Negative.  Fact Sheet for Patients:  EntrepreneurPulse.com.au  Fact Sheet for Healthcare Providers:  IncredibleEmployment.be  This test is no t yet approved or cleared by the [place] FDA and  has been authorized for detection and/or diagnosis of SARS-CoV-2 by FDA under an Emergency Use Authorization (EUA). This EUA will remain  in effect (meaning this test can be used) for the duration of the COVID-19 declaration under Section 564(b)(1) of the Act, 21 U.S.C.section 360bbb-3(b)(1), unless the authorization is terminated  or revoked sooner.       Influenza A by PCR NEGATIVE NEGATIVE Final    Influenza B by PCR NEGATIVE NEGATIVE Final    Comment: (NOTE) The Xpert Xpress SARS-CoV-2/FLU/RSV plus assay is intended as an aid in the diagnosis of influenza from Nasopharyngeal swab specimens and should not be used as a sole basis for treatment. Nasal washings and aspirates are unacceptable for Xpert Xpress SARS-CoV-2/FLU/RSV testing.  Fact Sheet for Patients: EntrepreneurPulse.com.au  Fact Sheet for Healthcare Providers: IncredibleEmployment.be  This test is not yet approved or cleared by the [place] FDA and has been authorized for detection and/or diagnosis of SARS-CoV-2 by FDA under an Emergency Use Authorization (EUA). This EUA will remain in effect (meaning this test can be used) for the duration of the COVID-19 declaration under Section 564(b)(1) of the Act, 21 U.S.C. section 360bbb-3(b)(1), unless the authorization is terminated or revoked.  Performed at [facility], [address] [place], [place] [address]   MRSA PCR Screening     Status: None   Collection Time: 12/10/20  1:41 AM   Specimen: Nasal Mucosa; Nasopharyngeal  Result Value Ref Range Status   MRSA by PCR NEGATIVE NEGATIVE Final    Comment:        The GeneXpert MRSA Assay (FDA approved for NASAL specimens only), is one component of a comprehensive MRSA colonization surveillance program. It is not intended to diagnose MRSA infection nor to  guide or monitor treatment for MRSA infections. Performed at [facility], [address], [place], [place] [address]      Scheduled Meds: . aspirin EC  81 mg Oral QHS  . Chlorhexidine Gluconate Cloth  6 each Topical Daily  . cholecalciferol  5,000 Units Oral q AM  . ferrous sulfate  325 mg Oral q AM  . insulin aspart  0-5 Units Subcutaneous QHS  . insulin aspart  0-9 Units Subcutaneous TID WC  . lactulose  20 g Oral Daily  . loratadine  10 mg Oral q AM  . magnesium oxide  400 mg Oral Daily  . pantoprazole  40 mg Oral  Daily  . pravastatin  20 mg Oral Daily   Continuous Infusions:  Procedures/Studies: DG Chest Portable 1 View  Result Date: 12/09/2020 CLINICAL DATA:  Hypotension EXAM: PORTABLE CHEST 1 VIEW COMPARISON:  10/28/2020 FINDINGS: Single frontal view of the chest demonstrates a stable cardiac silhouette. Postsurgical changes from median sternotomy. No airspace disease, effusion, or pneumothorax. No acute bony abnormalities. IMPRESSION: 1. Stable exam, no acute process. Electronically Signed   By: Randa Ngo M.D.   On: 12/09/2020 17:41    Orson Eva, DO  [facility]  If 7PM-7AM, please contact night-coverage [email or web address] Password [email or web address] 12/15/2020, 3:46 PM   LOS: 5 days

## 2020-12-15 NOTE — Progress Notes (Signed)
Physical Therapy Treatment Patient Details Name: [name] MRN: [id number] DOB: 12-29-1960 Today's Date: 12/15/2020    History of Present Illness [name] is a 60 y.o. male with medical history significant of microcytic anemia, osteoarthritis, CAD/CABG, liver cirrhosis, stage III CKD, chronic diastolic CHF, essential hypertension, hyperlipidemia, OSA on CPAP, polyclonal gammopathy, thrombocytopenia, type II DM who is coming to the emergency department referred from Dr. Tomie China office after developing hypotension at the office.  The patient just yesterday got his carvedilol increased from 3.125 mg to 6.25 mg and states he has been lightheaded since.  He also endorses decreased urination over the past 2 days.  He denies fever, chills, rhinorrhea or sore throat.  No wheezing, dyspnea or hemoptysis.  Denies chest pain, palpitations, diaphoresis, PND, orthopnea, but gets occasional pitting edema of the lower extremities.  No abdominal pain, nausea, vomiting, diarrhea, constipation, melena or hematochezia.  No dysuria, frequency or hematuria, but decreased urination.  No polyuria, polydipsia, polyphagia or blurred vision.    PT Comments    Patient demonstrates increased endurance/distance for ambulation without loss of balance, limited mostly due to fatigue and mild SOB, fair/good return for completing BLE ROM/strengthening exercises while seated at bedside with occasional verbal cueing and demonstration.  Patient tolerated sitting up in chair after therapy with speech therapist present.  Patient will benefit from continued physical therapy in hospital and recommended venue below to increase strength, balance, endurance for safe ADLs and gait.     Follow Up Recommendations  SNF;Supervision for mobility/OOB;Supervision - Intermittent     Equipment Recommendations  None recommended by PT    Recommendations for Other Services       Precautions / Restrictions  Precautions Precautions: Fall Restrictions Weight Bearing Restrictions: No    Mobility  Bed Mobility Overal bed mobility: Needs Assistance Bed Mobility: Supine to Sit     Supine to sit: Min guard     General bed mobility comments: increased time, labored movement  Transfers Overall transfer level: Needs assistance Equipment used: Rolling walker (2 wheeled) Transfers: Sit to/from [facility] Sit to Stand: Min assist;Min guard Stand pivot transfers: Min assist       General transfer comment: demonstrates increased BLE strength for completing sit to stands  Ambulation/Gait Ambulation/Gait assistance: Min assist Gait Distance (Feet): 60 Feet Assistive device: Rolling walker (2 wheeled) Gait Pattern/deviations: Decreased step length - right;Decreased step length - left;Decreased stride length Gait velocity: decreased   General Gait Details: demonstrates increased endurance/distance for ambulation with slow slightly labored cadence, no loss of balance, limited mostly due to fatigue and mild SOB   Stairs             Wheelchair Mobility    Modified Rankin (Stroke Patients Only)       Balance Overall balance assessment: Needs assistance Sitting-balance support: Feet supported;No upper extremity supported Sitting balance-Leahy Scale: Good Sitting balance - Comments: seated at EOB   Standing balance support: During functional activity;Bilateral upper extremity supported Standing balance-Leahy Scale: Fair Standing balance comment: using RW                            Cognition Arousal/Alertness: Awake/alert Behavior During Therapy: WFL for tasks assessed/performed Overall Cognitive Status: Within Functional Limits for tasks assessed  Exercises General Exercises - Lower Extremity Long Arc Quad: Seated;AROM;Strengthening;Both;20 reps Hip Flexion/Marching:  Seated;AROM;Strengthening;Both;20 reps Toe Raises: Seated;AROM;Strengthening;Both;20 reps Heel Raises: Seated;AROM;Strengthening;Both;20 reps    General Comments        Pertinent Vitals/Pain Pain Assessment: No/denies pain    Home Living                      Prior Function            PT Goals (current goals can now be found in the care plan section) Acute Rehab PT Goals Patient Stated Goal: return home with family to assist Time For Goal Achievement: 12/25/20 Potential to Achieve Goals: Good Progress towards PT goals: Progressing toward goals    Frequency    Min 3X/week      PT Plan Current plan remains appropriate    Co-evaluation              AM-PAC PT "6 Clicks" Mobility   Outcome Measure  Help needed turning from your back to your side while in a flat bed without using bedrails?: A Little Help needed moving from lying on your back to sitting on the side of a flat bed without using bedrails?: A Little Help needed moving to and from a bed to a chair (including a wheelchair)?: A Little Help needed standing up from a chair using your arms (e.g., wheelchair or bedside chair)?: A Little Help needed to walk in hospital room?: A Little Help needed climbing 3-5 steps with a railing? : A Lot 6 Click Score: 17    End of Session   Activity Tolerance: Patient tolerated treatment well;Patient limited by fatigue Patient left: in bed;with call bell/phone within reach Nurse Communication: Mobility status PT Visit Diagnosis: Unsteadiness on feet (R26.81);Other abnormalities of gait and mobility (R26.89);Muscle weakness (generalized) (M62.81)     Time: 1421-1450 PT Time Calculation (min) (ACUTE ONLY): 29 min  Charges:  $Gait Training: 8-22 mins $Therapeutic Exercise: 8-22 mins                     3:02 PM, 12/15/20 [name] Grandchild, MPT Physical Therapist with [facility] [phone] office [phone] mobile phone

## 2020-12-15 NOTE — TOC Progression Note (Signed)
Transition of Care [facility]) - Progression Note    Patient Details  Name: [name] MRN: [id number] Date of Birth: [birth year]  Transition of Care [facility] CM/SW Contact  Natasha Bence, LCSW Phone Number: 12/15/2020, 3:43 PM  Clinical Narrative:    [facility] made bed offer for patient. Kerri with [facility] reported that Ins will need 2 disciplines for SNF approval. CSW request SLP and or OT eval for patient. Kerri reported both the PT eval and additional eval will need to be completed within 24 hours of auth submission. CSW requested SLP and OT eval for patient. TOC to follow.    Expected Discharge Plan: [place] Barriers to Discharge: Continued Medical Work up  Expected Discharge Plan and Services Expected Discharge Plan: [place] In-house Referral: Clinical Social Work   Post Acute Care Choice: [place] arrangements for the past 2 months: Single Family Home                                       Social Determinants of Health (SDOH) Interventions    Readmission Risk Interventions Readmission Risk Prevention Plan 12/10/2020  Transportation Screening Complete  HRI or Home Care Consult Complete  Social Work Consult for [place] Planning/Counseling Complete  Palliative Care Screening Not Applicable  Medication Review [employer] photographer) Complete  Some recent data might be hidden

## 2020-12-15 NOTE — Evaluation (Signed)
Speech Language Pathology Evaluation Patient Details Name: [name] MRN: [id number] DOB: 1961/06/09 Today's Date: 12/15/2020 Time: 4098-1191 SLP Time Calculation (min) (ACUTE ONLY): 31.9 min  Problem List:  Patient Active Problem List   Diagnosis Date Noted  . Hypotension 12/10/2020  . Acute renal failure superimposed on stage 3b chronic kidney disease ([place]) 12/10/2020  . Acute renal failure superimposed on stage 3b chronic kidney disease, unspecified acute renal failure type ([place]) 12/10/2020  . AKI (acute kidney injury) ([place]) 12/09/2020  . GERD (gastroesophageal reflux disease) 10/09/2020  . Anasarca   . Cirrhosis of liver ([place])   . Acute on chronic heart failure with preserved ejection fraction ([place])   . Liver failure ([place]) 06/20/2020  . Esophageal varices ([place]) 01/09/2020  . History of adenomatous polyp of colon 01/09/2020  . Elevated AST (SGOT) 08/23/2019  . Diarrhea 07/17/2019  . Hypersomnia, persistent 03/12/2019  . Macrocytic anemia 01/18/2019  . Other pancytopenia ([place]) 01/18/2019  . Thrombocytopenia ([place]) 12/30/2018  . Polyclonal gammopathy 12/30/2018  . S/P total knee replacement, left 03/09/17 11/22/2017  . Primary osteoarthritis of left knee 03/09/2017  . History of nocturia 03/02/2017  . OSA on CPAP 03/02/2016  . Hypoxia, sleep related 03/02/2016  . Cyst of mediastinum 01/15/2016  . S/P CABG x 4 08/29/2015  . CAD (coronary artery disease)   . Obesity, Class III, BMI 40-49.9 (morbid obesity) ([place])   . Hyperglycemia   . Pain in the chest 08/23/2015  . Non-ST elevation MI (NSTEMI) ([place]) 08/23/2015  . Diabetes mellitus type 2, uncontrolled ([place]) 08/23/2015  . Hypertension 08/23/2015  . Hyperlipidemia 08/23/2015  . Type 2 diabetes mellitus with hyperglycemia ([place]) 2016  . Class 2 obesity 2016  . Hyponatremia 2016  . Osteoarthritis, knee 05/24/2012  . Knee pain 10/29/2011  . Knee stiffness 10/29/2011  . S/P right knee arthroscopy 10/26/2011  . Medial  meniscus, posterior horn derangement 09/07/2011  . Lateral meniscus derangement 09/07/2011  . OA (osteoarthritis) of knee 09/07/2011  . Rotator cuff syndrome of left shoulder 08/19/2011  . Right knee meniscal tear 08/19/2011   Past Medical History:  Past Medical History:  Diagnosis Date  . Anemia   . Arthritis   . CAD (coronary artery disease)    Multivessel disease status post CABG 08/2015  . Cirrhosis ([place])   . CKD (chronic kidney disease) stage 3, GFR 30-59 ml/min (HCC)   . Diastolic CHF ([place])   . Essential hypertension   . Hyperlipidemia   . OSA on CPAP   . Polyclonal gammopathy   . Thrombocytopenia ([place]) 2016  . Type 2 diabetes mellitus ([place])    Past Surgical History:  Past Surgical History:  Procedure Laterality Date  . APPENDECTOMY    . Biceps tendon surgery Right   . BIOPSY  11/22/2019   Procedure: BIOPSY;  Surgeon: Daneil Dolin, MD;  [facility];  Service: Endoscopy;;  . CARDIAC CATHETERIZATION N/A 08/25/2015   Procedure: Left Heart Cath and Coronary Angiography;  Surgeon: Belva Crome, MD;  Location: [place];  Service: Cardiovascular;  Laterality: N/A;  . COLONOSCOPY WITH PROPOFOL N/A 11/22/2019   Procedure: COLONOSCOPY WITH PROPOFOL;  Surgeon: Daneil Dolin, MD; Four 4-5 mm polyps, findings suggestive of portal colopathy, congested appearing colonic mucosa diffusely, rectal varices, and adequate right colon prep.  Pathology with tubular adenomas and hyperplastic polyp.  Right colon biopsy with focal active colitis.  Recommendations to repeat colonoscopy in 3 months due to poor prep.  . COLONOSCOPY WITH  PROPOFOL N/A 03/17/2020   Procedure: COLONOSCOPY WITH PROPOFOL;  Surgeon: Daneil Dolin, MD;  [facility];  Service: Endoscopy;  Laterality: N/A;  8:45am - pt does not need covid test, was + 2/4 <90 days  . CORONARY ARTERY BYPASS GRAFT N/A 08/29/2015   Procedure: CORONARY ARTERY BYPASS GRAFTING (CABG);  Surgeon: [name],  MD;  Location: [facility];  Service: Open Heart Surgery;  Laterality: N/A;  . ESOPHAGOGASTRODUODENOSCOPY (EGD) WITH PROPOFOL N/A 11/22/2019   Procedure: ESOPHAGOGASTRODUODENOSCOPY (EGD) WITH PROPOFOL;  Surgeon: Daneil Dolin, MD; 4 columns of grade 2-3 esophageal varices, portal gastropathy, gastric polyp/abnormal gastric mucosa s/p biopsy.  Pathology with hyperplastic polyp, mild chronic gastritis, negative H. pylori.  [name] KNEE ARTHROSCOPY Left   . TEE WITHOUT CARDIOVERSION N/A 08/29/2015   Procedure: TRANSESOPHAGEAL ECHOCARDIOGRAM (TEE);  Surgeon: [name], MD;  Location: [place] [facility];  Service: Open Heart Surgery;  Laterality: N/A;  . TOTAL KNEE ARTHROPLASTY Left 03/09/2017   Procedure: TOTAL KNEE ARTHROPLASTY;  Surgeon: Carole Civil, MD;  Location: [facility];  Service: Orthopedics;  Laterality: Left;   HPI:  [name] is a 60 y.o. male with medical history significant of microcytic anemia, osteoarthritis, CAD/CABG, liver cirrhosis, stage III CKD, chronic diastolic CHF, essential hypertension, hyperlipidemia, OSA on CPAP, polyclonal gammopathy, thrombocytopenia, type II DM who is coming to the emergency department referred from Dr. Tomie China office after developing hypotension at the office.  The patient just yesterday got his carvedilol increased from 3.125 mg to 6.25 mg and states he has been lightheaded since.  He also endorses decreased urination over the past 2 days.  He denies fever, chills, rhinorrhea or sore throat.  No wheezing, dyspnea or hemoptysis.  Denies chest pain, palpitations, diaphoresis, PND, orthopnea, but gets occasional pitting edema of the lower extremities.  No abdominal pain, nausea, vomiting, diarrhea, constipation, melena or hematochezia.  No dysuria, frequency or hematuria, but decreased urination.  No polyuria, polydipsia, polyphagia or blurred vision.SLE requested....   Assessment / Plan / Recommendation Clinical Impression  Cognitive linguistic evaluation  requested. Pt denies changes in thinking, memory, speech, language, voice, or swallowing. He was administered the SLUMS cognitive screener as well as other informal measures to assess his cognitive linguistic skills. Pt scored a 30/30. He was able to communicate complex thoughts without evidence of aphasia, no dysarthria. Pt is at baseline (WNL) for cognitive communication skills. No further SLP services required.    SLP Assessment  SLP Recommendation/Assessment: Patient does not need any further Speech Lanaguage Pathology Services SLP Visit Diagnosis: Cognitive communication deficit (R41.841)    Follow Up Recommendations  None    Frequency and Duration           SLP Evaluation Cognition  Overall Cognitive Status: Within Functional Limits for tasks assessed Arousal/Alertness: Awake/alert Orientation Level: Oriented X4 Memory: Appears intact (5/5) Awareness: Appears intact Problem Solving: Appears intact Safety/Judgment: Appears intact       Comprehension  Auditory Comprehension Overall Auditory Comprehension: Appears within functional limits for tasks assessed Yes/No Questions: Within Functional Limits Commands: Within Functional Limits Conversation: Complex Visual Recognition/Discrimination Discrimination: Within Function Limits Reading Comprehension Reading Status: Not tested    Expression Expression Primary Mode of Expression: Verbal Verbal Expression Overall Verbal Expression: Appears within functional limits for tasks assessed Initiation: No impairment Automatic Speech: Name;Social Response Level of Generative/Spontaneous Verbalization: Conversation Repetition: No impairment Naming: No impairment Pragmatics: No impairment Non-Verbal Means of Communication: Not applicable Written Expression Dominant Hand: Right Written Expression: Not tested  Oral / Motor  Oral Motor/Sensory Function Overall Oral Motor/Sensory Function: Within functional limits Motor  Speech Overall Motor Speech: Appears within functional limits for tasks assessed Respiration: Within functional limits Phonation: Normal Resonance: Within functional limits Articulation: Within functional limitis Intelligibility: Intelligible Motor Planning: Witnin functional limits Motor Speech Errors: Not applicable     Thank you,  Genene Churn, [place]                    [place] 12/15/2020, 3:14 PM

## 2020-12-16 ENCOUNTER — Inpatient Hospital Stay
Admission: RE | Admit: 2020-12-16 | Discharge: 2020-12-23 | Disposition: A | Discharge status: Home / Self Care | Payer: Federal, State, Local not specified - PPO | Source: Ambulatory Visit | Attending: Internal Medicine | Admitting: Internal Medicine

## 2020-12-16 DIAGNOSIS — G4733 Obstructive sleep apnea (adult) (pediatric): Secondary | ICD-10-CM | POA: Diagnosis not present

## 2020-12-16 DIAGNOSIS — D696 Thrombocytopenia, unspecified: Secondary | ICD-10-CM | POA: Diagnosis not present

## 2020-12-16 DIAGNOSIS — E1129 Type 2 diabetes mellitus with other diabetic kidney complication: Secondary | ICD-10-CM | POA: Diagnosis not present

## 2020-12-16 DIAGNOSIS — E6609 Other obesity due to excess calories: Secondary | ICD-10-CM | POA: Diagnosis not present

## 2020-12-16 DIAGNOSIS — E1165 Type 2 diabetes mellitus with hyperglycemia: Secondary | ICD-10-CM | POA: Diagnosis not present

## 2020-12-16 DIAGNOSIS — I9589 Other hypotension: Secondary | ICD-10-CM | POA: Diagnosis not present

## 2020-12-16 DIAGNOSIS — M6281 Muscle weakness (generalized): Secondary | ICD-10-CM | POA: Diagnosis not present

## 2020-12-16 DIAGNOSIS — E669 Obesity, unspecified: Secondary | ICD-10-CM | POA: Diagnosis not present

## 2020-12-16 DIAGNOSIS — I2510 Atherosclerotic heart disease of native coronary artery without angina pectoris: Secondary | ICD-10-CM | POA: Diagnosis not present

## 2020-12-16 DIAGNOSIS — D649 Anemia, unspecified: Secondary | ICD-10-CM | POA: Diagnosis not present

## 2020-12-16 DIAGNOSIS — E1169 Type 2 diabetes mellitus with other specified complication: Secondary | ICD-10-CM | POA: Diagnosis not present

## 2020-12-16 DIAGNOSIS — I959 Hypotension, unspecified: Secondary | ICD-10-CM | POA: Diagnosis not present

## 2020-12-16 DIAGNOSIS — N1832 Chronic kidney disease, stage 3b: Secondary | ICD-10-CM | POA: Diagnosis not present

## 2020-12-16 DIAGNOSIS — R262 Difficulty in walking, not elsewhere classified: Secondary | ICD-10-CM | POA: Diagnosis not present

## 2020-12-16 DIAGNOSIS — I130 Hypertensive heart and chronic kidney disease with heart failure and stage 1 through stage 4 chronic kidney disease, or unspecified chronic kidney disease: Secondary | ICD-10-CM | POA: Diagnosis not present

## 2020-12-16 DIAGNOSIS — E785 Hyperlipidemia, unspecified: Secondary | ICD-10-CM | POA: Diagnosis not present

## 2020-12-16 DIAGNOSIS — F419 Anxiety disorder, unspecified: Secondary | ICD-10-CM | POA: Diagnosis not present

## 2020-12-16 DIAGNOSIS — N179 Acute kidney failure, unspecified: Secondary | ICD-10-CM | POA: Diagnosis not present

## 2020-12-16 DIAGNOSIS — I25810 Atherosclerosis of coronary artery bypass graft(s) without angina pectoris: Secondary | ICD-10-CM | POA: Diagnosis not present

## 2020-12-16 DIAGNOSIS — K219 Gastro-esophageal reflux disease without esophagitis: Secondary | ICD-10-CM | POA: Diagnosis not present

## 2020-12-16 DIAGNOSIS — I5033 Acute on chronic diastolic (congestive) heart failure: Secondary | ICD-10-CM | POA: Diagnosis not present

## 2020-12-16 DIAGNOSIS — K7581 Nonalcoholic steatohepatitis (NASH): Secondary | ICD-10-CM | POA: Diagnosis not present

## 2020-12-16 DIAGNOSIS — I10 Essential (primary) hypertension: Secondary | ICD-10-CM | POA: Diagnosis not present

## 2020-12-16 DIAGNOSIS — K7460 Unspecified cirrhosis of liver: Secondary | ICD-10-CM | POA: Diagnosis not present

## 2020-12-16 DIAGNOSIS — I5032 Chronic diastolic (congestive) heart failure: Secondary | ICD-10-CM | POA: Diagnosis not present

## 2020-12-16 DIAGNOSIS — E1122 Type 2 diabetes mellitus with diabetic chronic kidney disease: Secondary | ICD-10-CM | POA: Diagnosis not present

## 2020-12-16 DIAGNOSIS — M25512 Pain in left shoulder: Secondary | ICD-10-CM | POA: Diagnosis not present

## 2020-12-16 DIAGNOSIS — D539 Nutritional anemia, unspecified: Secondary | ICD-10-CM | POA: Diagnosis not present

## 2020-12-16 DIAGNOSIS — E871 Hypo-osmolality and hyponatremia: Secondary | ICD-10-CM | POA: Diagnosis not present

## 2020-12-16 LAB — BASIC METABOLIC PANEL
Anion gap: 8 (ref 5–15)
BUN: 26 mg/dL — ABNORMAL HIGH (ref 8–23)
CO2: 21 mmol/L — ABNORMAL LOW (ref 22–32)
Calcium: 8.8 mg/dL — ABNORMAL LOW (ref 8.9–10.3)
Chloride: 103 mmol/L (ref 98–111)
Creatinine, Ser: 1.19 mg/dL (ref 0.61–1.24)
GFR, Estimated: >60 mL/min (ref >60)
Glucose, Bld: 155 mg/dL — ABNORMAL HIGH (ref 70–99)
Potassium: 3.9 mmol/L (ref 3.5–5.1)
Sodium: 132 mmol/L — ABNORMAL LOW (ref 135–145)

## 2020-12-16 LAB — CBC
HCT: 33.4 % — ABNORMAL LOW (ref 39.0–52.0)
Hemoglobin: 11.3 g/dL — ABNORMAL LOW (ref 13.0–17.0)
MCH: 33.3 pg (ref 26.0–34.0)
MCHC: 33.8 g/dL (ref 30.0–36.0)
MCV: 98.5 fL (ref 80.0–100.0)
Platelets: 85 10*3/uL — ABNORMAL LOW (ref 150–400)
RBC: 3.39 MIL/uL — ABNORMAL LOW (ref 4.22–5.81)
RDW: 14 % (ref 11.5–15.5)
WBC: 5.9 10*3/uL (ref 4.0–10.5)
nRBC: 0 % (ref 0.0–0.2)

## 2020-12-16 LAB — GLUCOSE, CAPILLARY
Glucose-Capillary: 97 mg/dL (ref 70–99)
Glucose-Capillary: 246 mg/dL — ABNORMAL HIGH (ref 70–99)
Glucose-Capillary: 258 mg/dL — ABNORMAL HIGH (ref 70–99)

## 2020-12-16 LAB — SARS CORONAVIRUS 2 (TAT 6-24 HRS): SARS Coronavirus 2: NEGATIVE

## 2020-12-16 MED ORDER — TRESIBA FLEXTOUCH 200 UNIT/ML ~~LOC~~ SOPN: 10.0000 [IU] | PEN_INJECTOR | Freq: Every day | SUBCUTANEOUS | Status: DC

## 2020-12-16 NOTE — Evaluation (Signed)
Occupational Therapy Evaluation Patient Details Name: [name] MRN: [id number] DOB: [birth year] Today's Date: 12/16/2020    History of Present Illness [name] is a 60 y.o. male with medical history significant of microcytic anemia, osteoarthritis, CAD/CABG, liver cirrhosis, stage III CKD, chronic diastolic CHF, essential hypertension, hyperlipidemia, OSA on CPAP, polyclonal gammopathy, thrombocytopenia, type II DM who is coming to the emergency department referred from Dr. Tomie China office after developing hypotension at the office.  The patient just yesterday got his carvedilol increased from 3.125 mg to 6.25 mg and states he has been lightheaded since.  He also endorses decreased urination over the past 2 days.  He denies fever, chills, rhinorrhea or sore throat.  No wheezing, dyspnea or hemoptysis.  Denies chest pain, palpitations, diaphoresis, PND, orthopnea, but gets occasional pitting edema of the lower extremities.  No abdominal pain, nausea, vomiting, diarrhea, constipation, melena or hematochezia.  No dysuria, frequency or hematuria, but decreased urination.  No polyuria, polydipsia, polyphagia or blurred vision.   Clinical Impression   Pt agreeable to OT evaluation this am. Pt reports no lightheaded sensation or dizziness this am, blood glucose checked by NT and at 97 at start of session. Pt performing morning ADLs with supervision/min guard this am. Pt requires increased time for tasks due to fatigue. Pt without LOB during tasks. Pt's family unable to provide 24/7 supervision on discharge. Recommend SNF on discharge to improve pt's strength and activity tolerance prior to returning home, pt is agreeable. No further acute OT needs at this time.     Follow Up Recommendations  SNF;Supervision/Assistance - 24 hour    Equipment Recommendations  None recommended by OT       Precautions / Restrictions Precautions Precautions: Fall Restrictions Weight Bearing  Restrictions: No      Mobility Bed Mobility Overal bed mobility: Needs Assistance Bed Mobility: Supine to Sit     Supine to sit: Supervision          Transfers Overall transfer level: Needs assistance Equipment used: Rolling walker (2 wheeled) Transfers: Sit to/from [facility] Sit to Stand: Min guard Stand pivot transfers: Min guard       General transfer comment: increased time for tasks        ADL either performed or assessed with clinical judgement   ADL Overall ADL's : Needs assistance/impaired     Grooming: Wash/dry hands;Wash/dry face;Oral care;Min guard;Standing Grooming Details (indicate cue type and reason): pt performing tasks while standing at sink, occasional one UE support during tasks. Min guard for safety Upper Body Bathing: Supervision/ safety;Sitting   Lower Body Bathing: Supervison/ safety;Sitting/lateral leans   Upper Body Dressing : Modified independent;Sitting   Lower Body Dressing: Supervision/safety;Sitting/lateral leans;Sit to/from stand   Toilet Transfer: Min guard;Ambulation;RW Toilet Transfer Details (indicate cue type and reason): increased time for transfer [place] and Hygiene: Supervision/safety;Sitting/lateral lean;Sit to/from stand       Functional mobility during ADLs: Min guard       Vision Baseline Vision/History: Wears glasses Wears Glasses: At all times Patient Visual Report: No change from baseline Vision Assessment?: No apparent visual deficits            Pertinent Vitals/Pain Pain Assessment: No/denies pain     Hand Dominance Right   Extremity/Trunk Assessment Upper Extremity Assessment Upper Extremity Assessment: Overall WFL for tasks assessed   Lower Extremity Assessment Lower Extremity Assessment: Defer to PT evaluation   Cervical / Trunk Assessment Cervical / Trunk Assessment: Normal   Communication Communication  Communication: No difficulties    Cognition Arousal/Alertness: Awake/alert Behavior During Therapy: WFL for tasks assessed/performed Overall Cognitive Status: Within Functional Limits for tasks assessed                                                Home Living Family/patient expects to be discharged to:: Private residence Living Arrangements: Spouse/significant other;Other relatives (2 grandsons) Available Help at Discharge: Family;Available PRN/intermittently Type of Home: House Home Access: Stairs to enter [employer] of Steps: 3 Entrance Stairs-Rails: Left Home Layout: One level     Bathroom Shower/Tub: Occupational psychologist: Handicapped height     Home Equipment: Environmental consultant - 2 wheels;Cane - single point;Cane - quad      Lives With: Spouse;Family (2 grandsons)    Prior Functioning/Environment Level of Independence: Independent with assistive device(s)        Comments: Community ambulator using [place]; reports independence in ADLs        OT Problem List: Decreased activity tolerance;Impaired balance (sitting and/or standing);Cardiopulmonary status limiting activity       End of Session Equipment Utilized During Treatment: Gait belt;Rolling walker  Activity Tolerance: Patient tolerated treatment well Patient left: in chair;with call bell/phone within reach  OT Visit Diagnosis: Muscle weakness (generalized) (M62.81)                Time: 2536-6440 OT Time Calculation (min): 18 min Charges:  OT General Charges $OT Visit: 1 Visit OT Evaluation $OT Eval Low Complexity: [place], OTR/L  [phone] 12/16/2020, 8:34 AM

## 2020-12-16 NOTE — TOC Transition Note (Signed)
Transition of Care [facility]) - CM/SW Discharge Note   Patient Details  Name: ESTILL LLERENA MRN: [id number] Date of Birth: Feb 26, 1961  [facility] CM/SW Contact:  Shade Flood, LCSW Phone Number: 12/16/2020, 4:25 PM   Clinical Narrative:     Pt stable for dc. [facility] has received insurance auth. Updated MD and pt will transfer today. DC Clinical sent electronically. RN to call report. There are no other TOC needs for dc.  Final next level of care: Skilled Nursing Facility Barriers to Discharge: Barriers Resolved   Patient Goals and CMS Choice Patient states their goals for this hospitalization and ongoing recovery are:: return home   Choice offered to / list presented to : Spouse  Discharge Placement              Patient chooses bed at: [facility] Patient to be transferred to facility by: w/c Name of family member notified: [place] Patient and family notified of of transfer: 12/16/20  Discharge Plan and Services In-house Referral: Clinical Social Work   Post Acute Care Choice: Home Health                               Social Determinants of Health (SDOH) Interventions     Readmission Risk Interventions Readmission Risk Prevention Plan 12/10/2020  Transportation Screening Complete  HRI or Home Care Consult Complete  Social Work Consult for [place] Planning/Counseling Complete  Palliative Care Screening Not Applicable  Medication Review Press photographer) Complete  Some recent data might be hidden

## 2020-12-16 NOTE — TOC Progression Note (Signed)
Transition of Care [facility]) - Progression Note    Patient Details  Name: [name] MRN: [id number] Date of Birth: 01/03/1961  Transition of Care [facility]) CM/SW Contact  Salome Arnt, [place] Phone Number: 12/16/2020, 10:49 AM  Clinical Narrative:  LCSW spoke with Marianna Fuss at [facility] who reports she started authorization yesterday after PT/OT evaluations were completed. She states that pt's insurance only covers 30 days at rehab in a calendar year. Pt notified.      Expected Discharge Plan: [place] Barriers to Discharge: Continued Medical Work up  Expected Discharge Plan and Services Expected Discharge Plan: [place] In-house Referral: Clinical Social Work   Post Acute Care Choice: [place] arrangements for the past 2 months: Single Family Home                                       Social Determinants of Health (SDOH) Interventions    Readmission Risk Interventions Readmission Risk Prevention Plan 12/10/2020  Transportation Screening Complete  HRI or Home Care Consult Complete  Social Work Consult for [place] Planning/Counseling Complete  Palliative Care Screening Not Applicable  Medication Review Press photographer) Complete  Some recent data might be hidden

## 2020-12-17 ENCOUNTER — Other Ambulatory Visit: Payer: Self-pay | Admitting: Adult Health

## 2020-12-17 ENCOUNTER — Encounter: Payer: Self-pay | Admitting: Adult Health

## 2020-12-17 ENCOUNTER — Non-Acute Institutional Stay (SKILLED_NURSING_FACILITY): Payer: Federal, State, Local not specified - PPO | Admitting: Adult Health

## 2020-12-17 DIAGNOSIS — I129 Hypertensive chronic kidney disease with stage 1 through stage 4 chronic kidney disease, or unspecified chronic kidney disease: Secondary | ICD-10-CM

## 2020-12-17 DIAGNOSIS — E785 Hyperlipidemia, unspecified: Secondary | ICD-10-CM

## 2020-12-17 DIAGNOSIS — I9589 Other hypotension: Secondary | ICD-10-CM

## 2020-12-17 DIAGNOSIS — D696 Thrombocytopenia, unspecified: Secondary | ICD-10-CM | POA: Diagnosis not present

## 2020-12-17 DIAGNOSIS — F419 Anxiety disorder, unspecified: Secondary | ICD-10-CM

## 2020-12-17 DIAGNOSIS — E66812 Obesity, class 2: Secondary | ICD-10-CM

## 2020-12-17 DIAGNOSIS — K219 Gastro-esophageal reflux disease without esophagitis: Secondary | ICD-10-CM | POA: Diagnosis not present

## 2020-12-17 DIAGNOSIS — I5032 Chronic diastolic (congestive) heart failure: Secondary | ICD-10-CM | POA: Diagnosis not present

## 2020-12-17 DIAGNOSIS — K7460 Unspecified cirrhosis of liver: Secondary | ICD-10-CM

## 2020-12-17 DIAGNOSIS — D539 Nutritional anemia, unspecified: Secondary | ICD-10-CM | POA: Diagnosis not present

## 2020-12-17 DIAGNOSIS — E669 Obesity, unspecified: Secondary | ICD-10-CM

## 2020-12-17 DIAGNOSIS — N183 Chronic kidney disease, stage 3 unspecified: Secondary | ICD-10-CM | POA: Insufficient documentation

## 2020-12-17 DIAGNOSIS — E1122 Type 2 diabetes mellitus with diabetic chronic kidney disease: Secondary | ICD-10-CM | POA: Diagnosis not present

## 2020-12-17 DIAGNOSIS — I130 Hypertensive heart and chronic kidney disease with heart failure and stage 1 through stage 4 chronic kidney disease, or unspecified chronic kidney disease: Secondary | ICD-10-CM | POA: Diagnosis not present

## 2020-12-17 DIAGNOSIS — J3089 Other allergic rhinitis: Secondary | ICD-10-CM

## 2020-12-17 DIAGNOSIS — I5033 Acute on chronic diastolic (congestive) heart failure: Secondary | ICD-10-CM | POA: Insufficient documentation

## 2020-12-17 DIAGNOSIS — E1169 Type 2 diabetes mellitus with other specified complication: Secondary | ICD-10-CM | POA: Diagnosis not present

## 2020-12-17 DIAGNOSIS — I25810 Atherosclerosis of coronary artery bypass graft(s) without angina pectoris: Secondary | ICD-10-CM | POA: Diagnosis not present

## 2020-12-17 DIAGNOSIS — N1832 Chronic kidney disease, stage 3b: Secondary | ICD-10-CM

## 2020-12-17 MED ORDER — ALPRAZOLAM 0.5 MG PO TABS: 0.5000 mg | ORAL_TABLET | Freq: Every day | ORAL | 0 refills | Status: DC | PRN

## 2020-12-17 NOTE — Progress Notes (Signed)
Location:  [facility] Room Number: 133/P Place of Service:  SNF (31)   CODE STATUS: Full Code  Allergies  Allergen Reactions  . Fluticasone Rash    Chief Complaint  Patient presents with  . Hospitalization Follow-up    Hospitalization Follow Up     HPI:  He is a 60 year old man who has been hospitalized from 12-09-20 to 12-16-20. For 2 days prior to his admission he felt groggy and cloudy; he went to his oncologist where his systolic blood pressure was in the 60s.. he was taken to the hospital for further workup and treatment. His demadex had been increased to twice daily for a short period of time; however; he continued to take this dose.  He was treated for hypotension; secondary to volume depletion and recent changes in his blood pressure medications for his liver cirrhosis.  normal cortisol level; he required IVF; and his demadex was returned to one time daily. His blood pressure remains in the low 100s. He had acute on chronic stage 3b CKD; he was treated with IVF with a return of his creatine to his base line. He is followed by Dr. Ardeen Garland hepatologist; he is on the liver transplant list; he will come off this list until he returns back home. His goal is to return home. He denies any weakness; no dizziness; no chest pain; no shortness of breath. He will continue to be followed for his chronic illnesses including: Hypertensive heart and kidney disease with heart failure and chronic kidney disease stage III:    Coronary artery disease involving coronary bypass graft of native heart without angina:  Chronic diastolic heart failure    Past Medical History:  Diagnosis Date  . Anemia   . Arthritis   . CAD (coronary artery disease)    Multivessel disease status post CABG 08/2015  . Cirrhosis ([place])   . CKD (chronic kidney disease) stage 3, GFR 30-59 ml/min (HCC)   . Diastolic CHF ([place])   . Essential hypertension   . Hyperlipidemia   . OSA on CPAP   .  Polyclonal gammopathy   . Thrombocytopenia ([place]) 2016  . Type 2 diabetes mellitus ([place])     Past Surgical History:  Procedure Laterality Date  . APPENDECTOMY    . Biceps tendon surgery Right   . BIOPSY  11/22/2019   Procedure: BIOPSY;  Surgeon: Daneil Dolin, MD;  Location: [facility];  Service: Endoscopy;;  . CARDIAC CATHETERIZATION N/A 08/25/2015   Procedure: Left Heart Cath and Coronary Angiography;  Surgeon: Belva Crome, MD;  Location: [facility];  Service: Cardiovascular;  Laterality: N/A;  . COLONOSCOPY WITH PROPOFOL N/A 11/22/2019   Procedure: COLONOSCOPY WITH PROPOFOL;  Surgeon: Daneil Dolin, MD; Four 4-5 mm polyps, findings suggestive of portal colopathy, congested appearing colonic mucosa diffusely, rectal varices, and adequate right colon prep.  Pathology with tubular adenomas and hyperplastic polyp.  Right colon biopsy with focal active colitis.  Recommendations to repeat colonoscopy in 3 months due to poor prep.  . COLONOSCOPY WITH PROPOFOL N/A 03/17/2020   Procedure: COLONOSCOPY WITH PROPOFOL;  Surgeon: Daneil Dolin, MD;  Location: [facility];  Service: Endoscopy;  Laterality: N/A;  8:45am - pt does not need covid test, was + 2/4 <90 days  . CORONARY ARTERY BYPASS GRAFT N/A 08/29/2015   Procedure: CORONARY ARTERY BYPASS GRAFTING (CABG);  Surgeon: [name], MD;  Location: [facility];  Service: Open Heart Surgery;  Laterality: N/A;  . ESOPHAGOGASTRODUODENOSCOPY (EGD) WITH PROPOFOL N/A 11/22/2019   Procedure: ESOPHAGOGASTRODUODENOSCOPY (EGD) WITH PROPOFOL;  Surgeon: Daneil Dolin, MD; 4 columns of grade 2-3 esophageal varices, portal gastropathy, gastric polyp/abnormal gastric mucosa s/p biopsy.  Pathology with hyperplastic polyp, mild chronic gastritis, negative H. pylori.  [name] KNEE ARTHROSCOPY Left   . TEE WITHOUT CARDIOVERSION N/A 08/29/2015   Procedure: TRANSESOPHAGEAL ECHOCARDIOGRAM (TEE);  Surgeon: [name], MD;  Location: [place];  Service:  Open Heart Surgery;  Laterality: N/A;  . TOTAL KNEE ARTHROPLASTY Left 03/09/2017   Procedure: TOTAL KNEE ARTHROPLASTY;  Surgeon: Carole Civil, MD;  Location: [facility];  Service: Orthopedics;  Laterality: Left;    Social History   Socioeconomic History  . Marital status: Married    Spouse name: Not on file  . Number of children: 0  . Years of education: college  . Highest education level: Not on file  Occupational History  . Occupation: Maintenance tech    Employer: [employer]  Tobacco Use  . Smoking status: Never Smoker  . Smokeless tobacco: Never Used  Vaping Use  . Vaping Use: Never used  Substance and Sexual Activity  . Alcohol use: No    Alcohol/week: 0.0 standard drinks  . Drug use: No  . Sexual activity: Not Currently  Other Topics Concern  . Not on file  Social History Narrative  . Not on file   Social Determinants of Health   Financial Resource Strain: Not on file  Food Insecurity: Not on file  Transportation Needs: Not on file  Physical Activity: Not on file  Stress: Not on file  Social Connections: Not on file  Intimate Partner Violence: Not on file   Family History  Problem Relation Age of Onset  . Arthritis Other   . Cancer Other   . Diabetes Other   . CAD Father   . Diabetes Mellitus II Father   . Liver cancer Father   . Hodgkin's lymphoma Father   . CAD Brother   . Diabetes Mellitus II Brother   . ALS Mother   . Diabetes Mellitus II Sister   . Diabetes Mellitus II Brother   . Diabetes Mellitus II Maternal Grandmother   . Aneurysm Maternal Grandmother   . Cancer Maternal Grandfather   . Anesthesia problems Neg Hx   . Hypotension Neg Hx   . Malignant hyperthermia Neg Hx   . Pseudochol deficiency Neg Hx   . Colon cancer Neg Hx       VITAL SIGNS BP 110/72   Pulse 88   Temp 98 F (36.7 C)   Resp 18   Ht 5' 10.5" (1.791 m)   Wt 251 lb 12.3 oz (114.2 kg)   SpO2 95%   BMI 35.61 kg/m   Outpatient Encounter Medications as of  12/17/2020  Medication Sig  . albuterol (VENTOLIN HFA) 108 (90 Base) MCG/ACT inhaler Inhale 2 puffs into the lungs every 6 (six) hours as needed for wheezing or shortness of breath.  . ALPRAZolam (XANAX) 0.5 MG tablet Take 0.5 mg by mouth daily as needed.  . Armodafinil 200 MG TABS Take 200 mg by mouth daily as needed (for sleepiness).  [name] aspirin EC 81 MG tablet Take 81 mg by mouth at bedtime.   . Cholecalciferol (DIALYVITE VITAMIN D 5000) 125 MCG (5000 UT) capsule Take 5,000 Units by mouth in the morning.   . ferrous sulfate 325 (65 FE) MG tablet Take 325 mg by mouth in the morning.   [name]  Flaxseed, Linseed, (FLAXSEED OIL) 1000 MG CAPS Take 1,000 mg by mouth at bedtime.  . insulin aspart (NOVOLOG) 100 UNIT/ML injection Inject 5 Units into the skin 3 (three) times daily before meals. Special Instructions: If accu-check is greater than 150. Hold for accu-check 150 or below. With Meals  . [START ON 12/18/2020] insulin degludec (TRESIBA FLEXTOUCH) 200 UNIT/ML FlexTouch Pen Inject 40 Units into the skin in the morning. Give 30 units Subcutaneous  at Bedtime 8:00 pm  . lactulose (CHRONULAC) 10 GM/15ML solution Take 30 mLs (20 g total) by mouth daily.  [name] loratadine (CLARITIN) 10 MG tablet Take 10 mg by mouth in the morning.   . Magnesium 250 MG TABS Take 250 mg by mouth daily.  . Misc Natural Products (ELDERBERRY ZINC/VIT C/IMMUNE) LOZG Take 1 lozenge by mouth at bedtime.  . NON FORMULARY Diet: _____ Regular, __x____ NAS, __x_____Consistent Carbohydrate, _______NPO _____Other  . omeprazole (PRILOSEC) 40 MG capsule Take 40 mg by mouth at bedtime.  [name] OZEMPIC, 1 MG/DOSE, 4 MG/3ML SOPN Inject 1 mg into the skin once a week.  . pravastatin (PRAVACHOL) 20 MG tablet Take 20 mg by mouth daily.  [name] spironolactone (ALDACTONE) 100 MG tablet Take 0.5 tablets (50 mg total) by mouth daily.  [name] torsemide (DEMADEX) 20 MG tablet Take 60 mg by mouth daily.  . TURMERIC CURCUMIN PO Take 2,000 mg by mouth at bedtime.   .  Continuous Blood Gluc Receiver (FREESTYLE LIBRE 14 DAY READER) DEVI 1 reader (Patient not taking: Reported on 12/17/2020)  . Continuous Blood Gluc Sensor (FREESTYLE LIBRE 14 DAY SENSOR) MISC 1 sensor (Patient not taking: Reported on 12/17/2020)  . Elastic Bandages & Supports (MEDICAL COMPRESSION STOCKINGS) MISC 1 each by Does not apply route as directed. Low pressure knee high compression stockings Dx: leg swelling (Patient not taking: Reported on 12/17/2020)  . INS SYRINGE/NEEDLE .5CC/28G 28G X 1/2" 0.5 ML MISC See admin instructions. (Patient not taking: Reported on 12/17/2020)   No facility-administered encounter medications on file as of 12/17/2020.     SIGNIFICANT DIAGNOSTIC EXAMS  TODAY  12-09-20: chest x-ray:  Single frontal view of the chest demonstrates a stable cardiac silhouette. Postsurgical changes from median sternotomy. No airspace disease, effusion, or pneumothorax. No acute bony abnormalities.  LABS REVIEWED TODAY;   12-09-20: wbc 11.4; hgb 12.1; hct 35.8; mcv 101.4 plt 73; glucose 189; bun 77; creat 4.00; k+ 4.9; na++ 125; ca 8.1 GFR: 16; liver normal albumin 3.1 ammonia 18 12-10-20 wbc 6.9; hgb 12.5; hct 36.8; mcv 101.1 plt 52; glucose 85; bun 70; creat 3.09; k+ 4.5; na++ 133; ca 8.2; GFR:22; liver normal albumin 3.4 hgb a1c 7.9 12-11-20: tsh 0.414 free t4: 0.71 AM cortisol 15.3 12-17-19: wbc 5.9; hgb 11.3; hct 33.4; mcv 98.5 plt 85; glucose 155; bun 26; creat 1.19; k+ 3.9; na++ 132; ca 8.8 GFR>60  Review of Systems  Constitutional: Negative for malaise/fatigue.  Respiratory: Negative for cough and shortness of breath.   Cardiovascular: Negative for chest pain, palpitations and leg swelling.  Gastrointestinal: Negative for abdominal pain, constipation and heartburn.  Musculoskeletal: Negative for back pain, joint pain and myalgias.  Skin: Negative.   Neurological: Negative for dizziness.  Psychiatric/Behavioral: The patient is not nervous/anxious.      Physical  Exam Constitutional:      General: He is not in acute distress.    Appearance: He is well-developed and well-nourished. He is obese. He is not diaphoretic.  HENT:     Mouth/Throat:     Mouth:  Mucous membranes are moist.     Pharynx: Oropharynx is clear.  Neck:     Thyroid: No thyromegaly.  Cardiovascular:     Rate and Rhythm: Normal rate and regular rhythm.     Pulses: Intact distal pulses.     Heart sounds: Normal heart sounds.  Pulmonary:     Effort: Pulmonary effort is normal. No respiratory distress.     Breath sounds: Normal breath sounds.  Abdominal:     General: Bowel sounds are normal. There is no distension.     Palpations: Abdomen is soft.     Tenderness: There is no abdominal tenderness.  Musculoskeletal:        General: No edema. Normal range of motion.     Cervical back: Neck supple.     Right lower leg: No edema.     Left lower leg: No edema.  Lymphadenopathy:     Cervical: No cervical adenopathy.  Skin:    General: Skin is warm and dry.  Neurological:     Mental Status: He is alert and oriented to person, place, and time.  Psychiatric:        Mood and Affect: Mood and affect and mood normal.        ASSESSMENT/ PLAN:  TODAY  1. Hypertensive heart and kidney disease with heart failure and chronic kidney disease stage III: has had hypotension; his antihypertensives have been stopped at this time. B/p 110/72 will continue to monitor his status.   2. Coronary artery disease involving coronary bypass graft of native heart without angina: is stable will continue asa 81 mg daily  3. Chronic diastolic heart failure is stable EF 60-65% (05-26-20) will continue aldactone 50 mg daily demadex 60 mg daily   4. Hepatic cirrhosis unspecified hepatic cirrhosis type unspecified whether ascites present: is stable is off the transplant list while he is a patient here; will continue lactulose 30 mL daily will monitor his status.   5. Gastroesophageal reflux disease  unspecified whether esophagitis present; is stable will continue prilosec 40 mg daily   6. Type 2 diabetes mellitus with stage 3b chronic kidney disease and hypertension: is without change: hgb a1c 7.9; will change tresiba to 40 units in the AM and 30 units in the PM will continue novolog 5 units with meals; will continue ozempric 1 mg weekly   7. Chronic kidney disease stage 3 due to type 2 diabetes mellitus: is stable bun 26 creat 1.19  8. Hyperlipidemia associated with type 2 diabetes mellitus: is stable will continue pravachol 20 mg daily   9. Thrombocytopenia: is without change plt 85 will monitor   10. Macrocytic anemia: is stable hgb 11.3 will continue iron daily   11. Class 2 obesity: BMI 35.61 will monitor   12. Anxiety is stable will continue xanax 0.5 mg twice daily as needed for 14 days  13. Chronic non seasonal allergic rhinitis: is stable will continue claritin 10 mg daily     MD is aware of resident's narcotic use and is in agreement with current plan of care. We will attempt to wean resident as appropriate.  Ok Edwards NP [facility]  Contact [phone] Monday through Friday 8am- 5pm  After hours call [phone]

## 2020-12-19 ENCOUNTER — Non-Acute Institutional Stay (SKILLED_NURSING_FACILITY): Payer: Federal, State, Local not specified - PPO | Admitting: Internal Medicine

## 2020-12-19 ENCOUNTER — Encounter: Payer: Self-pay | Admitting: Internal Medicine

## 2020-12-19 ENCOUNTER — Other Ambulatory Visit (HOSPITAL_COMMUNITY)
Admission: RE | Admit: 2020-12-19 | Discharge: 2020-12-19 | Disposition: A | Discharge status: Home / Self Care | Payer: Federal, State, Local not specified - PPO | Source: Skilled Nursing Facility | Attending: Adult Health | Admitting: Adult Health

## 2020-12-19 DIAGNOSIS — M25512 Pain in left shoulder: Secondary | ICD-10-CM

## 2020-12-19 DIAGNOSIS — E1165 Type 2 diabetes mellitus with hyperglycemia: Secondary | ICD-10-CM

## 2020-12-19 DIAGNOSIS — I9589 Other hypotension: Secondary | ICD-10-CM

## 2020-12-19 DIAGNOSIS — K7581 Nonalcoholic steatohepatitis (NASH): Secondary | ICD-10-CM | POA: Diagnosis not present

## 2020-12-19 DIAGNOSIS — N179 Acute kidney failure, unspecified: Secondary | ICD-10-CM

## 2020-12-19 LAB — CBC WITH DIFFERENTIAL/PLATELET
Abs Immature Granulocytes: 0.02 10*3/uL (ref 0.00–0.07)
Basophils Absolute: 0.1 10*3/uL (ref 0.0–0.1)
Basophils Relative: 1 %
Eosinophils Absolute: 0.2 10*3/uL (ref 0.0–0.5)
Eosinophils Relative: 3 %
HCT: 32.8 % — ABNORMAL LOW (ref 39.0–52.0)
Hemoglobin: 11.3 g/dL — ABNORMAL LOW (ref 13.0–17.0)
Immature Granulocytes: 0 %
Lymphocytes Relative: 32 %
Lymphs Abs: 1.9 10*3/uL (ref 0.7–4.0)
MCH: 33.6 pg (ref 26.0–34.0)
MCHC: 34.5 g/dL (ref 30.0–36.0)
MCV: 97.6 fL (ref 80.0–100.0)
Monocytes Absolute: 0.5 10*3/uL (ref 0.1–1.0)
Monocytes Relative: 9 %
Neutro Abs: 3.4 10*3/uL (ref 1.7–7.7)
Neutrophils Relative %: 55 %
Platelets: 105 10*3/uL — ABNORMAL LOW (ref 150–400)
RBC: 3.36 MIL/uL — ABNORMAL LOW (ref 4.22–5.81)
RDW: 14.4 % (ref 11.5–15.5)
WBC: 6 10*3/uL (ref 4.0–10.5)
nRBC: 0 % (ref 0.0–0.2)

## 2020-12-19 LAB — COMPREHENSIVE METABOLIC PANEL
ALT: 13 U/L (ref 0–44)
AST: 19 U/L (ref 15–41)
Albumin: 3.4 g/dL — ABNORMAL LOW (ref 3.5–5.0)
Alkaline Phosphatase: 55 U/L (ref 38–126)
Anion gap: 11 (ref 5–15)
BUN: 38 mg/dL — ABNORMAL HIGH (ref 8–23)
CO2: 23 mmol/L (ref 22–32)
Calcium: 8.8 mg/dL — ABNORMAL LOW (ref 8.9–10.3)
Chloride: 99 mmol/L (ref 98–111)
Creatinine, Ser: 1.62 mg/dL — ABNORMAL HIGH (ref 0.61–1.24)
GFR, Estimated: 48 mL/min — ABNORMAL LOW (ref >60)
Glucose, Bld: 108 mg/dL — ABNORMAL HIGH (ref 70–99)
Potassium: 3.4 mmol/L — ABNORMAL LOW (ref 3.5–5.1)
Sodium: 133 mmol/L — ABNORMAL LOW (ref 135–145)
Total Bilirubin: 1 mg/dL (ref 0.3–1.2)
Total Protein: 6.7 g/dL (ref 6.5–8.1)

## 2020-12-19 LAB — APTT: aPTT: 33 seconds (ref 24–36)

## 2020-12-19 NOTE — Assessment & Plan Note (Signed)
Current A1c is 7.9% which is an adequate goal based on his multiple, advanced comorbidities.  Critical is to avoid hypoglycemia which would mimic TIAs.

## 2020-12-19 NOTE — Patient Instructions (Signed)
See assessment and plan under each diagnosis in the problem list and acutely for this visit 

## 2020-12-19 NOTE — Assessment & Plan Note (Signed)
Blood pressure remains soft and antihypertensive regimen will not be titrated upward at this time.

## 2020-12-19 NOTE — Progress Notes (Signed)
NURSING HOME LOCATION: [place] NUMBER: 133/P   CODE STATUS: Full Code   PCP: [name], MD   This is a comprehensive admission note to [facility] performed on this date less than 30 days from date of admission. Included are preadmission medical/surgical history; reconciled medication list; family history; social history and comprehensive review of systems.  Corrections and additions to the records were documented. Comprehensive physical exam was also performed. Additionally a clinical summary was entered for each active diagnosis pertinent to this admission in the Problem List to enhance continuity of care.  HPI: Patient was hospitalized 12/09/2020 - 12/16/2020, presenting with lightheadedness and generalized malaise for approximately 2 days prior to admission.  He also described feeling "groggy and cloudy".  He also had noted decreased urine output for a few days PTA. At a  follow-up visit with his Oncologist to evaluate his thrombocytopenia, systolic blood pressure was noted to be in the 60s prompting the admission. Cardiology had increased his torsemide to 60 mg twice daily at the 12/7 visit.  After 1 week he was to decrease it back to once daily.  Unfortunately he continued to take the twice daily dosage. He had been seen 12/21 at [facility]; at that visit nadolol was discontinued and carvedilol 6.25 mg twice daily initiated. Blood pressure was documented as 60/45 in the ED; he received 2 L of normal saline bolus with an improvement in his systolic blood pressure to the 90s.  Midodrine and octreotide were initiated in the ED subsequently discontinued.   Lactic acid was 1.4 and subsequently 2.1.  White count was 11,400 with 72% neutrophils.  Macrocytic anemia was present with a hemoglobin of 12.1.  Platelet count was 73,000.  Sodium was 125.  Creatinine 4 and BUN 77. Hypotension was attributed to volume depletion in the context of antihypertensive regimen changes in the  context of liver cirrhosis. Torsemide initially was held was subsequently decreased to 60 mg daily.  The blood pressures did improve but remains soft with systolic blood pressures in the low 100s.  Coreg was also held until follow-up visits with medication regimen adjustment based on his blood pressure log. AKI superimposed on CKD stage IIIb was felt to be related to volume depletion.  Baseline creatinine was felt to be 1.6-1.9.  With rehydration creatinine was 1.19 at discharge. A1c was 7.9% on 12/29; Lantus has been discontinued in the setting of acute kidney injury and hypoglycemia.  NovoLog sliding scale was continued during hospitalization.  Tresiba dose was decreased to 10 units at discharge. Spironolactone and torsemide were held because of hypotension and AKI.  Lactulose was continued throughout the hospitalization. Post discharge he was to follow-up with Hepatology at [facility].  Past medical and surgical history: Also includes CAD, polyclonal gammopathy, OSA on CPAP, dyslipidemia, chronic diastolic heart failure, and the NAFLD/ALD cirrhosis complicated by thrombocytopenia. Surgeries and procedures include cardiac catheterization, CABG, and TKA.  Social history: Nondrinker, never smoked.  Family history: Extensive history reviewed.  Dramatic is the family history of diabetes.   Review of systems: His only active complaints are pain in the left shoulder which appeared today upon awakening.  Also with PT/OT he has been having increased pain in the left knee ,site of the previous TKA.  He denies any cardiopulmonary or GI symptoms.  Constitutional: No fever, significant weight change, fatigue  Eyes: No redness, discharge, pain, vision change ENT/mouth: No nasal congestion, purulent discharge, earache, change in hearing, sore throat  Cardiovascular: No chest pain,  palpitations, paroxysmal nocturnal dyspnea, claudication, edema  Respiratory: No cough, sputum production, hemoptysis, DOE, significant  snoring, apnea Gastrointestinal: No heartburn, dysphagia, abdominal pain, nausea /vomiting, rectal bleeding, melena, change in bowels Genitourinary: No dysuria, hematuria, pyuria, incontinence, nocturia Dermatologic: No new rash, pruritus, change in appearance of skin Neurologic: No dizziness, headache, syncope, seizures, numbness, tingling Psychiatric: No significant anxiety, depression, insomnia, anorexia Endocrine: No change in hair/skin/nails, excessive thirst, excessive hunger, excessive urination  Hematologic/lymphatic: No significant bruising, lymphadenopathy, abnormal bleeding Allergy/immunology: No itchy/watery eyes, significant sneezing, urticaria, angioedema  Physical exam:  Pertinent or positive findings: He is morbidly obese.  Pattern alopecia is present.  Beard and mustache are cropped closely.  Slight exotropia noted on the left.  He has multiple missing teeth and some malalignment. Faint bruit in the left carotid, faint basilar systolic murmur, and slight tachycardia are present.  He has trace - 1/2+ edema at the sock line.  The skin over the lower shins is dry and slightly scaly with faint hyperpigmentation.  Strength of opposition is good except in the left upper extremity.  He opposition increases the left shoulder pain.  General appearance: no acute distress, increased work of breathing is present.   Lymphatic: No lymphadenopathy about the head, neck, axilla. Eyes: No conjunctival inflammation or lid edema is present. There is no scleral icterus. Ears:  External ear exam shows no significant lesions or deformities.   Nose:  External nasal examination shows no deformity or inflammation. Nasal mucosa are pink and moist without lesions, exudates Oral exam: There is no oropharyngeal erythema or exudate. Neck:  No thyromegaly, masses, tenderness noted.    Heart:  No gallop, click, rub.  Lungs: Chest clear to auscultation without wheezes, rhonchi, rales, rubs. Abdomen: Bowel  sounds are normal.  Abdomen is soft and nontender with no organomegaly, hernias, masses. GU: Deferred  Extremities:  No cyanosis, clubbing. Neurologic exam:  Balance, Rhomberg, finger to nose testing could not be completed due to clinical state Skin: Warm & dry w/o tenting.  See clinical summary under each active problem in the Problem List with associated updated therapeutic plan

## 2020-12-21 NOTE — Assessment & Plan Note (Addendum)
Monitor BMET  Avoid nephrotoxic meds

## 2020-12-22 ENCOUNTER — Other Ambulatory Visit: Payer: Self-pay | Admitting: Adult Health

## 2020-12-22 ENCOUNTER — Encounter: Payer: Self-pay | Admitting: Adult Health

## 2020-12-22 ENCOUNTER — Non-Acute Institutional Stay (SKILLED_NURSING_FACILITY): Payer: Federal, State, Local not specified - PPO | Admitting: Adult Health

## 2020-12-22 ENCOUNTER — Encounter (HOSPITAL_COMMUNITY)
Admission: RE | Admit: 2020-12-22 | Discharge: 2020-12-22 | Disposition: A | Discharge status: Home / Self Care | Payer: Federal, State, Local not specified - PPO | Source: Skilled Nursing Facility | Attending: Internal Medicine | Admitting: Internal Medicine

## 2020-12-22 ENCOUNTER — Other Ambulatory Visit: Payer: Self-pay | Admitting: Cardiology

## 2020-12-22 DIAGNOSIS — D696 Thrombocytopenia, unspecified: Secondary | ICD-10-CM

## 2020-12-22 DIAGNOSIS — I5033 Acute on chronic diastolic (congestive) heart failure: Secondary | ICD-10-CM | POA: Diagnosis not present

## 2020-12-22 DIAGNOSIS — K7460 Unspecified cirrhosis of liver: Secondary | ICD-10-CM

## 2020-12-22 DIAGNOSIS — I130 Hypertensive heart and chronic kidney disease with heart failure and stage 1 through stage 4 chronic kidney disease, or unspecified chronic kidney disease: Secondary | ICD-10-CM | POA: Insufficient documentation

## 2020-12-22 LAB — BASIC METABOLIC PANEL
Anion gap: 10 (ref 5–15)
BUN: 32 mg/dL — ABNORMAL HIGH (ref 8–23)
CO2: 24 mmol/L (ref 22–32)
Calcium: 8.9 mg/dL (ref 8.9–10.3)
Chloride: 97 mmol/L — ABNORMAL LOW (ref 98–111)
Creatinine, Ser: 1.5 mg/dL — ABNORMAL HIGH (ref 0.61–1.24)
GFR, Estimated: 53 mL/min — ABNORMAL LOW (ref >60)
Glucose, Bld: 119 mg/dL — ABNORMAL HIGH (ref 70–99)
Potassium: 3.8 mmol/L (ref 3.5–5.1)
Sodium: 131 mmol/L — ABNORMAL LOW (ref 135–145)

## 2020-12-22 MED ORDER — PRAVASTATIN SODIUM 20 MG PO TABS: ORAL_TABLET | ORAL | 0 refills | Status: DC

## 2020-12-22 MED ORDER — INSULIN SYRINGE/NEEDLE 28G X 1/2" 0.5 ML MISC
1.0000 | 0 refills | Status: DC
Start: 2020-12-22 — End: 2021-08-11

## 2020-12-22 MED ORDER — ARMODAFINIL 200 MG PO TABS: 200.0000 mg | ORAL_TABLET | Freq: Every day | ORAL | 0 refills | Status: DC | PRN

## 2020-12-22 MED ORDER — TRESIBA FLEXTOUCH 200 UNIT/ML ~~LOC~~ SOPN: 40.0000 [IU] | PEN_INJECTOR | Freq: Every morning | SUBCUTANEOUS | 0 refills | Status: DC

## 2020-12-22 MED ORDER — OMEPRAZOLE 40 MG PO CPDR: 40.0000 mg | DELAYED_RELEASE_CAPSULE | Freq: Every day | ORAL | 0 refills | Status: DC

## 2020-12-22 MED ORDER — SPIRONOLACTONE 100 MG PO TABS: 50.0000 mg | ORAL_TABLET | Freq: Every day | ORAL | 0 refills | Status: DC

## 2020-12-22 MED ORDER — LACTULOSE 10 GM/15ML PO SOLN
20.0000 g | Freq: Every day | ORAL | 0 refills | Status: AC
Start: 2020-12-22 — End: ?

## 2020-12-22 MED ORDER — TORSEMIDE 20 MG PO TABS: 60.0000 mg | ORAL_TABLET | Freq: Every day | ORAL | 0 refills | Status: DC

## 2020-12-22 MED ORDER — INSULIN ASPART 100 UNIT/ML ~~LOC~~ SOLN: 5.0000 [IU] | Freq: Three times a day (TID) | SUBCUTANEOUS | 0 refills | Status: DC

## 2020-12-22 MED ORDER — OZEMPIC (1 MG/DOSE) 4 MG/3ML ~~LOC~~ SOPN: 1.0000 mg | PEN_INJECTOR | SUBCUTANEOUS | 0 refills | Status: DC

## 2020-12-22 MED ORDER — ALPRAZOLAM 0.5 MG PO TABS: 0.5000 mg | ORAL_TABLET | Freq: Every day | ORAL | 0 refills | Status: DC | PRN

## 2020-12-22 MED ORDER — FREESTYLE LIBRE 14 DAY READER DEVI: 0 refills | Status: DC

## 2020-12-22 MED ORDER — ALBUTEROL SULFATE HFA 108 (90 BASE) MCG/ACT IN AERS: 2.0000 | INHALATION_SPRAY | Freq: Four times a day (QID) | RESPIRATORY_TRACT | 0 refills | Status: AC | PRN

## 2020-12-22 NOTE — Progress Notes (Signed)
Location:    [facility] Room Number: 133/P Place of Service:  SNF (31)    CODE STATUS: Full Code  Allergies  Allergen Reactions  . Fluticasone Rash    Chief Complaint  Patient presents with  . Discharge Note    Discharge Visit     HPI:  He is being discharged to home. He will complete his pt on an outpatient basis. He will not need any dme. He will need his prescriptions written and will need to follow up iwht his medical provider. He has been hospitalized. He had been hospitalized for hypotension and liver cirrhosis. He was admitted to this facility for short term rehab. He has participated in pt/ot and is now ready for discharge to home.    Past Medical History:  Diagnosis Date  . Anemia   . Arthritis   . CAD (coronary artery disease)    Multivessel disease status post CABG 08/2015  . Cirrhosis ([place])   . CKD (chronic kidney disease) stage 3, GFR 30-59 ml/min (HCC)   . Diastolic CHF ([place])   . Essential hypertension   . Hyperlipidemia   . OSA on CPAP   . Polyclonal gammopathy   . Thrombocytopenia ([place]) 2016  . Type 2 diabetes mellitus ([place])     Past Surgical History:  Procedure Laterality Date  . APPENDECTOMY    . Biceps tendon surgery Right   . BIOPSY  11/22/2019   Procedure: BIOPSY;  Surgeon: Daneil Dolin, MD;  [facility];  Service: Endoscopy;;  . CARDIAC CATHETERIZATION N/A 08/25/2015   Procedure: Left Heart Cath and Coronary Angiography;  Surgeon: Belva Crome, MD;  Location: [facility];  Service: Cardiovascular;  Laterality: N/A;  . COLONOSCOPY WITH PROPOFOL N/A 11/22/2019   Procedure: COLONOSCOPY WITH PROPOFOL;  Surgeon: Daneil Dolin, MD; Four 4-5 mm polyps, findings suggestive of portal colopathy, congested appearing colonic mucosa diffusely, rectal varices, and adequate right colon prep.  Pathology with tubular adenomas and hyperplastic polyp.  Right colon biopsy with focal active colitis.  Recommendations to  repeat colonoscopy in 3 months due to poor prep.  . COLONOSCOPY WITH PROPOFOL N/A 03/17/2020   Procedure: COLONOSCOPY WITH PROPOFOL;  Surgeon: Daneil Dolin, MD;  [facility];  Service: Endoscopy;  Laterality: N/A;  8:45am - pt does not need covid test, was + 2/4 <90 days  . CORONARY ARTERY BYPASS GRAFT N/A 08/29/2015   Procedure: CORONARY ARTERY BYPASS GRAFTING (CABG);  Surgeon: [name], MD;  Location: [facility];  Service: Open Heart Surgery;  Laterality: N/A;  . ESOPHAGOGASTRODUODENOSCOPY (EGD) WITH PROPOFOL N/A 11/22/2019   Procedure: ESOPHAGOGASTRODUODENOSCOPY (EGD) WITH PROPOFOL;  Surgeon: Daneil Dolin, MD; 4 columns of grade 2-3 esophageal varices, portal gastropathy, gastric polyp/abnormal gastric mucosa s/p biopsy.  Pathology with hyperplastic polyp, mild chronic gastritis, negative H. pylori.  [name] KNEE ARTHROSCOPY Left   . TEE WITHOUT CARDIOVERSION N/A 08/29/2015   Procedure: TRANSESOPHAGEAL ECHOCARDIOGRAM (TEE);  Surgeon: [name], MD;  Location: [facility];  Service: Open Heart Surgery;  Laterality: N/A;  . TOTAL KNEE ARTHROPLASTY Left 03/09/2017   Procedure: TOTAL KNEE ARTHROPLASTY;  Surgeon: Carole Civil, MD;  Location: [facility];  Service: Orthopedics;  Laterality: Left;    Social History   Socioeconomic History  . Marital status: Married    Spouse name: Not on file  . Number of children: 0  . Years of education: college  . Highest education level: Not on file  Occupational History  . Occupation: Maintenance tech    Employer: [employer]  Tobacco Use  . Smoking status: Never Smoker  . Smokeless tobacco: Never Used  Vaping Use  . Vaping Use: Never used  Substance and Sexual Activity  . Alcohol use: No    Alcohol/week: 0.0 standard drinks  . Drug use: No  . Sexual activity: Not Currently  Other Topics Concern  . Not on file  Social History Narrative  . Not on file   Social Determinants of Health   Financial Resource Strain: Not on  file  Food Insecurity: Not on file  Transportation Needs: Not on file  Physical Activity: Not on file  Stress: Not on file  Social Connections: Not on file  Intimate Partner Violence: Not on file   Family History  Problem Relation Age of Onset  . Arthritis Other   . Cancer Other   . Diabetes Other   . CAD Father   . Diabetes Mellitus II Father   . Liver cancer Father   . Hodgkin's lymphoma Father   . CAD Brother   . Diabetes Mellitus II Brother   . ALS Mother   . Diabetes Mellitus II Sister   . Diabetes Mellitus II Brother   . Diabetes Mellitus II Maternal Grandmother   . Aneurysm Maternal Grandmother   . Cancer Maternal Grandfather   . Anesthesia problems Neg Hx   . Hypotension Neg Hx   . Malignant hyperthermia Neg Hx   . Pseudochol deficiency Neg Hx   . Colon cancer Neg Hx     VITAL SIGNS BP (!) 110/56   Pulse 90   Temp (!) 97.5 F (36.4 C)   Resp 20   Ht 5' 10.5" (1.791 m)   Wt 246 lb 12.8 oz (111.9 kg)   SpO2 95%   BMI 34.91 kg/m   Patient's Medications  New Prescriptions   No medications on file  Previous Medications   ACETAMINOPHEN (TYLENOL) 325 MG TABLET    Take 650 mg by mouth every 6 (six) hours as needed.   ALBUTEROL (VENTOLIN HFA) 108 (90 BASE) MCG/ACT INHALER    Inhale 2 puffs into the lungs every 6 (six) hours as needed for wheezing or shortness of breath.   ALPRAZOLAM (XANAX) 0.5 MG TABLET    Take 1 tablet (0.5 mg total) by mouth daily as needed.   ARMODAFINIL 200 MG TABS    Take 200 mg by mouth daily as needed (for sleepiness).   ASPIRIN EC 81 MG TABLET    Take 81 mg by mouth at bedtime.    CHOLECALCIFEROL (DIALYVITE VITAMIN D 5000) 125 MCG (5000 UT) CAPSULE    Take 5,000 Units by mouth in the morning.    CONTINUOUS BLOOD GLUC RECEIVER (FREESTYLE LIBRE 14 DAY READER) DEVI    1 reader   CONTINUOUS BLOOD GLUC SENSOR (FREESTYLE LIBRE 14 DAY SENSOR) MISC    1 sensor   ELASTIC BANDAGES & SUPPORTS (MEDICAL COMPRESSION STOCKINGS) MISC    1 each by Does  not apply route as directed. Low pressure knee high compression stockings Dx: leg swelling   FERROUS SULFATE 325 (65 FE) MG TABLET    Take 325 mg by mouth in the morning.    FLAXSEED, LINSEED, (FLAXSEED OIL) 1000 MG CAPS    Take 1,000 mg by mouth at bedtime.   INS SYRINGE/NEEDLE .5CC/28G 28G X 1/2" 0.5 ML MISC    See admin instructions.   INSULIN ASPART (NOVOLOG) 100 UNIT/ML INJECTION  Inject 5 Units into the skin 3 (three) times daily before meals. Special Instructions: If accu-check is greater than 150. Hold for accu-check 150 or below. With Meals   INSULIN DEGLUDEC (TRESIBA FLEXTOUCH) 200 UNIT/ML FLEXTOUCH PEN    Inject 40 Units into the skin in the morning. Give 30 units Subcutaneous  at Bedtime 8:00 pm   LACTULOSE (CHRONULAC) 10 GM/15ML SOLUTION    Take 30 mLs (20 g total) by mouth daily.   LORATADINE (CLARITIN) 10 MG TABLET    Take 10 mg by mouth in the morning.    MAGNESIUM 250 MG TABS    Take 250 mg by mouth daily.   MISC NATURAL PRODUCTS (ELDERBERRY ZINC/VIT C/IMMUNE) LOZG    Take 1 lozenge by mouth at bedtime.   NON FORMULARY    Diet: _____ Regular, __x____ NAS, __x_____Consistent Carbohydrate, _______NPO _____Other   OMEPRAZOLE (PRILOSEC) 40 MG CAPSULE    Take 40 mg by mouth at bedtime.   OZEMPIC, 1 MG/DOSE, 4 MG/3ML SOPN    Inject 1 mg into the skin once a week.   PRAVASTATIN (PRAVACHOL) 20 MG TABLET    TAKE 1 TABLET(20 MG) BY MOUTH AT BEDTIME   SPIRONOLACTONE (ALDACTONE) 100 MG TABLET    Take 0.5 tablets (50 mg total) by mouth daily.   TORSEMIDE (DEMADEX) 20 MG TABLET    Take 60 mg by mouth daily.   TURMERIC CURCUMIN PO    Take 2,000 mg by mouth at bedtime.   Modified Medications   No medications on file  Discontinued Medications   No medications on file     SIGNIFICANT DIAGNOSTIC EXAMS   PREVIOUS   12-09-20: chest x-ray:  Single frontal view of the chest demonstrates a stable cardiac silhouette. Postsurgical changes from median sternotomy. No airspace disease,  effusion, or pneumothorax. No acute bony abnormalities.  NO NEW EXAMS.   LABS REVIEWED: PREVIOUS    12-09-20: wbc 11.4; hgb 12.1; hct 35.8; mcv 101.4 plt 73; glucose 189; bun 77; creat 4.00; k+ 4.9; na++ 125; ca 8.1 GFR: 16; liver normal albumin 3.1 ammonia 18 12-10-20 wbc 6.9; hgb 12.5; hct 36.8; mcv 101.1 plt 52; glucose 85; bun 70; creat 3.09; k+ 4.5; na++ 133; ca 8.2; GFR:22; liver normal albumin 3.4 hgb a1c 7.9 12-11-20: tsh 0.414 free t4: 0.71 AM cortisol 15.3 12-17-19: wbc 5.9; hgb 11.3; hct 33.4; mcv 98.5 plt 85; glucose 155; bun 26; creat 1.19; k+ 3.9; na++ 132; ca 8.8 GFR>60  NO NEW LABS.   Review of Systems  Constitutional: Negative for malaise/fatigue.  Respiratory: Negative for cough and shortness of breath.   Cardiovascular: Negative for chest pain, palpitations and leg swelling.  Gastrointestinal: Negative for abdominal pain, constipation and heartburn.  Musculoskeletal: Negative for back pain, joint pain and myalgias.  Skin: Negative.   Neurological: Negative for dizziness.  Psychiatric/Behavioral: The patient is not nervous/anxious.    Physical Exam Constitutional:      General: He is not in acute distress.    Appearance: He is well-developed and well-nourished. He is obese. He is not diaphoretic.  Neck:     Thyroid: No thyromegaly.  Cardiovascular:     Rate and Rhythm: Normal rate and regular rhythm.     Pulses: Normal pulses and intact distal pulses.     Heart sounds: Normal heart sounds.  Pulmonary:     Effort: Pulmonary effort is normal. No respiratory distress.     Breath sounds: Normal breath sounds.  Abdominal:     General: Bowel sounds are  normal. There is no distension.     Palpations: Abdomen is soft.     Tenderness: There is no abdominal tenderness.  Musculoskeletal:        General: No edema. Normal range of motion.     Cervical back: Neck supple.     Right lower leg: No edema.     Left lower leg: No edema.  Lymphadenopathy:     Cervical: No  cervical adenopathy.  Skin:    General: Skin is warm and dry.  Neurological:     Mental Status: He is alert and oriented to person, place, and time.  Psychiatric:        Mood and Affect: Mood and affect and mood normal.       ASSESSMENT/ PLAN:  Patient is being discharged with the following home health services: outpatient therapy for pt to evaluate and treat as indicated for gait balance and strength   Patient is being discharged with the following durable medical equipment:  Non needed   Patient has been advised to f/u with their PCP in 1-2 weeks to bring them up to date on their rehab stay.  Social services at facility was responsible for arranging this appointment.  Pt was provided with a 30 day supply of prescriptions for medications and refills must be obtained from their PCP.  For controlled substances, a more limited supply may be provided adequate until PCP appointment only.  A 30 day supply of his prescription medications with #10 xanax 0.5 mg and armodafinal 200 mg #30 to [facility] on [address]   Time spent with patient 35 minutes: dme; medications; outpatient therapy.    Ok Edwards NP [facility]  Contact [phone] Monday through Friday 8am- 5pm  After hours call [phone]

## 2020-12-24 ENCOUNTER — Encounter (HOSPITAL_COMMUNITY): Payer: Self-pay

## 2020-12-24 ENCOUNTER — Ambulatory Visit (HOSPITAL_COMMUNITY): Payer: Federal, State, Local not specified - PPO | Attending: Family Medicine

## 2020-12-24 ENCOUNTER — Other Ambulatory Visit: Payer: Self-pay

## 2020-12-24 DIAGNOSIS — M6281 Muscle weakness (generalized): Secondary | ICD-10-CM | POA: Diagnosis not present

## 2020-12-24 DIAGNOSIS — R2681 Unsteadiness on feet: Secondary | ICD-10-CM | POA: Insufficient documentation

## 2020-12-24 NOTE — Therapy (Signed)
[name], [place], [address] Phone: [phone]   Fax:  [phone]  Physical Therapy Evaluation  Patient Details  Name: [name] MRN: [id number] Date of Birth: Dec 11, 1961 Referring Provider (PT): Dr. [name]   Encounter Date: 12/24/2020   PT End of Session - 12/24/20 1443    Visit Number 1    Number of Visits 12    Date for PT Re-Evaluation 02/04/21    Authorization Type BCBS/Federal employee, no auth, no VL    PT Start Time 1436    PT Stop Time 1515    PT Time Calculation (min) 39 min    Activity Tolerance Patient tolerated treatment well;Patient limited by fatigue           Past Medical History:  Diagnosis Date  . Anemia   . Arthritis   . CAD (coronary artery disease)    Multivessel disease status post CABG 08/2015  . Cirrhosis ([place])   . CKD (chronic kidney disease) stage 3, GFR 30-59 ml/min (HCC)   . Diastolic CHF ([place])   . Essential hypertension   . Hyperlipidemia   . OSA on CPAP   . Polyclonal gammopathy   . Thrombocytopenia ([place]) 2016  . Type 2 diabetes mellitus ([place])     Past Surgical History:  Procedure Laterality Date  . APPENDECTOMY    . Biceps tendon surgery Right   . BIOPSY  11/22/2019   Procedure: BIOPSY;  Surgeon: Daneil Dolin, MD;  [facility];  Service: Endoscopy;;  . CARDIAC CATHETERIZATION N/A 08/25/2015   Procedure: Left Heart Cath and Coronary Angiography;  Surgeon: Belva Crome, MD;  Location: [facility];  Service: Cardiovascular;  Laterality: N/A;  . COLONOSCOPY WITH PROPOFOL N/A 11/22/2019   Procedure: COLONOSCOPY WITH PROPOFOL;  Surgeon: Daneil Dolin, MD; Four 4-5 mm polyps, findings suggestive of portal colopathy, congested appearing colonic mucosa diffusely, rectal varices, and adequate right colon prep.  Pathology with tubular adenomas and hyperplastic polyp.  Right colon biopsy with focal active colitis.  Recommendations to  repeat colonoscopy in 3 months due to poor prep.  . COLONOSCOPY WITH PROPOFOL N/A 03/17/2020   Procedure: COLONOSCOPY WITH PROPOFOL;  Surgeon: Daneil Dolin, MD;  [facility];  Service: Endoscopy;  Laterality: N/A;  8:45am - pt does not need covid test, was + 2/4 <90 days  . CORONARY ARTERY BYPASS GRAFT N/A 08/29/2015   Procedure: CORONARY ARTERY BYPASS GRAFTING (CABG);  Surgeon: [name], MD;  Location: [facility];  Service: Open Heart Surgery;  Laterality: N/A;  . ESOPHAGOGASTRODUODENOSCOPY (EGD) WITH PROPOFOL N/A 11/22/2019   Procedure: ESOPHAGOGASTRODUODENOSCOPY (EGD) WITH PROPOFOL;  Surgeon: Daneil Dolin, MD; 4 columns of grade 2-3 esophageal varices, portal gastropathy, gastric polyp/abnormal gastric mucosa s/p biopsy.  Pathology with hyperplastic polyp, mild chronic gastritis, negative H. pylori.  [name] KNEE ARTHROSCOPY Left   . TEE WITHOUT CARDIOVERSION N/A 08/29/2015   Procedure: TRANSESOPHAGEAL ECHOCARDIOGRAM (TEE);  Surgeon: [name], MD;  Location: [facility];  Service: Open Heart Surgery;  Laterality: N/A;  . TOTAL KNEE ARTHROPLASTY Left 03/09/2017   Procedure: TOTAL KNEE ARTHROPLASTY;  Surgeon: Carole Civil, MD;  Location: [facility];  Service: Orthopedics;  Laterality: Left;    There were no vitals filed for this visit.    Subjective Assessment - 12/24/20 1439    Subjective Patient experienced episode of hypotension and was subsequently hospitalized 1-2 weeks and then  spent 4-5 days  in a SNF following episode for short-term rehab.  Patient was D/C to home environment and reports continued deficits in ambulation and activity tolerance.  Of note, patient reports he is on the list for a liver transplant due to cirrhosis. Patient reports main complaint is poor activity/walking tolerance.    How long can you walk comfortably? 5-10 min    Currently in Pain? No/denies   reports chronic Right knee pain/problems from OA             [facility] PT Assessment -  12/24/20 0001      Assessment   Medical Diagnosis Hypotension    Referring Provider (PT) Dr. [name]    Prior Therapy recent SNF stay      Balance Screen   Has the patient fallen in the past 6 months No    Has the patient had a decrease in activity level because of a fear of falling?  No    Is the patient reluctant to leave their home because of a fear of falling?  No      Home Ecologist residence    Living Arrangements Spouse/significant other    Available Help at Discharge Family    Type of [place] to enter    Entrance Stairs-Number of Steps 2-3    [place] One level    [place] - 2 wheels;Cane - single point;Walker - 4 wheels      Prior Function   Level of Independence Independent    Vocation On disability    Leisure fishing, golfing      ROM / Strength   AROM / PROM / Strength Strength      Strength   Overall Strength Deficits    Overall Strength Comments 3+5 gross BLE strength      Transfers   Five time sit to stand comments  17 sec from 20" seat height      Ambulation/Gait   Ambulation/Gait Yes    Ambulation/Gait Assistance 5: Supervision    Ambulation Distance (Feet) 200 Feet    Assistive device None    Gait Pattern Decreased stance time - right;Decreased stride length   right lateral lean due to right knee pain. Episode of LOB during test   Ambulation Surface Level    Gait velocity decreased    Stairs Yes    Stairs Assistance 6: Modified independent (Device/Increase time)    Stair Management Technique Two rails;Step to pattern    Number of Stairs 4    Gait Comments 2MWT                      Objective measurements completed on examination: See above findings.       [facility] Adult PT Treatment/Exercise - 12/24/20 0001      Exercises   Exercises Knee/Hip      Knee/Hip Exercises: Supine   Hip Adduction Isometric Strengthening;Both;2 sets;10 reps    Straight Leg  Raises Strengthening;Both;2 sets;10 reps                  PT Education - 12/24/20 1455    Education Details Patient educated on use of cane to improve gait pattern and stability to reduce risk for falls    Person(s) Educated Patient    Methods Explanation    Comprehension Verbalized understanding;Returned demonstration            PT Short Term Goals -  12/24/20 1513      PT SHORT TERM GOAL #1   Title Patient will be independent with HEP in order to improve functional outcomes.    Time 3    Period Weeks    Status New    Target Date 01/14/21      PT SHORT TERM GOAL #2   Title Patient will manifest low risk for falls per time of 12 sec 5 Times Sit to Stand Test    Baseline 17 sec 20" seat height    Time 3    Period Weeks    Status New    Target Date 01/14/21      PT SHORT TERM GOAL #3   Title Patient will demo improved ambulation as evidenced by distance of 300 ft during 2MWT without need for rest period    Baseline 200 ft with LOB and 4/10 RPE    Time 3    Period Weeks    Status New    Target Date 01/14/21             PT Long Term Goals - 12/24/20 1520      PT LONG TERM GOAL #1   Title Patient will be able to navigate stairs with reciprocal pattern without compensation in order to demonstrate improved LE strength.    Baseline BHR and non-reciprocal    Time 6    Period Weeks    Status New    Target Date 02/04/21      PT LONG TERM GOAL #2   Title Patient will demonstrate improved ambulation tolerance as evidenced by distance of 350 ft during 2MWT    Baseline 200 ft w/ LOB    Time 6    Period Weeks    Status New    Target Date 02/04/21                  Plan - 12/24/20 1503    Clinical Impression Statement Patient demonstrates generalized weakness and reduced activity tolerance with notable unsteadiness on feet with near LOB during ambultion assessment of evaluation.  Patient demonstrates high risk for falls per time of 17 sec on 5 Times Sit  to Stand Test. Poor ambulation tolerance and decreased velocity as evidenced by distance of 200 ft during 2MWT and standing rest periods ad lib.  Patient would benefit from skilled PT services to increase strength and improve dynamic standing balance to reduce risk for falls during mobility and to develop comprehensive HEP to facilitate home-based self-progression to reduce sequelae from medical conditions    Personal Factors and Comorbidities Age;Comorbidity 1;Time since onset of injury/illness/exacerbation    Comorbidities PMH, liver    Examination-Activity Limitations Bed Mobility;Bend;Carry;Lift;Stand;Stairs;Squat;Locomotion Level;Transfers    Examination-Participation Restrictions Community Activity;Yard Work;Meal Prep    Stability/Clinical Decision Making Stable/Uncomplicated    Clinical Decision Making Low    Rehab Potential Good    PT Frequency 2x / week    PT Duration 6 weeks    PT Treatment/Interventions ADLs/Self Care Home Management;Aquatic Therapy;Electrical Stimulation;DME Instruction;Gait training;Stair training;Functional mobility training;Therapeutic activities;Therapeutic exercise;Balance training;Patient/family education;Neuromuscular re-education;Manual techniques;Dry needling;Energy conservation;Spinal Manipulations;Joint Manipulations    PT Next Visit Plan Develop LE PRE to improve activity tolerance    PT Home Exercise Plan SLR, hip add           Patient will benefit from skilled therapeutic intervention in order to improve the following deficits and impairments:  Abnormal gait,Decreased activity tolerance,Decreased balance,Decreased mobility,Decreased knowledge of use of DME,Decreased endurance,Decreased strength,Difficulty walking,Impaired perceived functional  ability,Improper body mechanics,Obesity  Visit Diagnosis: Unsteadiness on feet  Muscle weakness (generalized)     Problem List Patient Active Problem List   Diagnosis Date Noted  . Hypertensive heart and  kidney disease with heart failure and with chronic kidney disease stage III ([place]) 12/17/2020  . Chronic diastolic heart failure ([place]) 12/17/2020  . Type 2 diabetes mellitus with stage 3b chronic kidney disease and hypertension ([place]) 12/17/2020  . CKD stage 3 due to type 2 diabetes mellitus ([place]) 12/17/2020  . Hyperlipidemia associated with type 2 diabetes mellitus ([place]) 12/17/2020  . Anxiety 12/17/2020  . Chronic non-seasonal allergic rhinitis 12/17/2020  . Hypotension 12/10/2020  . Acute renal failure superimposed on stage 3b chronic kidney disease ([place]) 12/10/2020  . Acute renal failure superimposed on stage 3b chronic kidney disease, unspecified acute renal failure type ([place]) 12/10/2020  . AKI (acute kidney injury) ([place]) 12/09/2020  . GERD (gastroesophageal reflux disease) 10/09/2020  . Anasarca   . Cirrhosis of liver ([place])   . Acute on chronic heart failure with preserved ejection fraction ([place])   . Liver failure ([place]) 06/20/2020  . Esophageal varices ([place]) 01/09/2020  . History of adenomatous polyp of colon 01/09/2020  . Elevated AST (SGOT) 08/23/2019  . Diarrhea 07/17/2019  . Hypersomnia, persistent 03/12/2019  . Macrocytic anemia 01/18/2019  . Other pancytopenia ([place]) 01/18/2019  . Thrombocytopenia ([place]) 12/30/2018  . Polyclonal gammopathy 12/30/2018  . S/P total knee replacement, left 03/09/17 11/22/2017  . Primary osteoarthritis of left knee 03/09/2017  . History of nocturia 03/02/2017  . OSA on CPAP 03/02/2016  . Hypoxia, sleep related 03/02/2016  . Cyst of mediastinum 01/15/2016  . S/P CABG x 4 08/29/2015  . CAD (coronary artery disease)   . Obesity, Class III, BMI 40-49.9 (morbid obesity) ([place])   . Hyperglycemia   . Pain in the chest 08/23/2015  . Non-ST elevation MI (NSTEMI) ([place]) 08/23/2015  . Diabetes mellitus type 2, uncontrolled ([place]) 08/23/2015  . Hypertension 08/23/2015  . Hyperlipidemia 08/23/2015  . Type 2 diabetes mellitus with hyperglycemia ([place]) 2016  .  Class 2 obesity 2016  . Hyponatremia 2016  . Osteoarthritis, knee 05/24/2012  . Knee pain 10/29/2011  . Knee stiffness 10/29/2011  . S/P right knee arthroscopy 10/26/2011  . Medial meniscus, posterior horn derangement 09/07/2011  . Lateral meniscus derangement 09/07/2011  . OA (osteoarthritis) of knee 09/07/2011  . Rotator cuff syndrome of left shoulder 08/19/2011  . Right knee meniscal tear 08/19/2011    4:12 PM, 12/24/20 M. Sherlyn Lees, PT, DPT Physical Therapist- [place] [facility] Office Number: [phone]  [facility] [address] [place], [place], [address] Phone: [phone]   Fax:  [phone]  Name: TORRENCE HAMMACK MRN: [id number] Date of Birth: 15-Apr-1961

## 2020-12-25 DIAGNOSIS — I959 Hypotension, unspecified: Secondary | ICD-10-CM | POA: Diagnosis not present

## 2020-12-25 DIAGNOSIS — Z681 Body mass index (BMI) 19 or less, adult: Secondary | ICD-10-CM | POA: Diagnosis not present

## 2020-12-30 ENCOUNTER — Ambulatory Visit (HOSPITAL_COMMUNITY): Payer: Federal, State, Local not specified - PPO

## 2020-12-30 ENCOUNTER — Encounter (HOSPITAL_COMMUNITY): Payer: Self-pay

## 2020-12-30 ENCOUNTER — Other Ambulatory Visit: Payer: Self-pay

## 2020-12-30 VITALS — BP 115/66 | HR 109

## 2020-12-30 DIAGNOSIS — R2681 Unsteadiness on feet: Secondary | ICD-10-CM | POA: Diagnosis not present

## 2020-12-30 DIAGNOSIS — M6281 Muscle weakness (generalized): Secondary | ICD-10-CM | POA: Diagnosis not present

## 2020-12-30 NOTE — Therapy (Signed)
[facility] [address] [place], [place], [address] Phone: [phone]   Fax:  [phone]  Physical Therapy Treatment  Patient Details  Name: [name] MRN: [id number] Date of Birth: 02-20-1961 Referring Provider (PT): Dr. [name]   Encounter Date: 12/30/2020   PT End of Session - 12/30/20 0857    Visit Number 2    Number of Visits 12    Date for PT Re-Evaluation 02/04/21    Authorization Type BCBS/Federal employee, no auth, no VL    PT Start Time 6962   Pt late for apt due to weather   PT Stop Time 0932    PT Time Calculation (min) 38 min    Activity Tolerance Patient tolerated treatment well;Patient limited by fatigue    Behavior During Therapy [facility] for tasks assessed/performed           Past Medical History:  Diagnosis Date  . Anemia   . Arthritis   . CAD (coronary artery disease)    Multivessel disease status post CABG 08/2015  . Cirrhosis ([place])   . CKD (chronic kidney disease) stage 3, GFR 30-59 ml/min (HCC)   . Diastolic CHF ([place])   . Essential hypertension   . Hyperlipidemia   . OSA on CPAP   . Polyclonal gammopathy   . Thrombocytopenia ([place]) 2016  . Type 2 diabetes mellitus ([place])     Past Surgical History:  Procedure Laterality Date  . APPENDECTOMY    . Biceps tendon surgery Right   . BIOPSY  11/22/2019   Procedure: BIOPSY;  Surgeon: Daneil Dolin, MD;  [facility];  Service: Endoscopy;;  . CARDIAC CATHETERIZATION N/A 08/25/2015   Procedure: Left Heart Cath and Coronary Angiography;  Surgeon: Belva Crome, MD;  Location: [place] CV LAB;  Service: Cardiovascular;  Laterality: N/A;  . COLONOSCOPY WITH PROPOFOL N/A 11/22/2019   Procedure: COLONOSCOPY WITH PROPOFOL;  Surgeon: Daneil Dolin, MD; Four 4-5 mm polyps, findings suggestive of portal colopathy, congested appearing colonic mucosa diffusely, rectal varices, and adequate right colon prep.  Pathology with tubular adenomas  and hyperplastic polyp.  Right colon biopsy with focal active colitis.  Recommendations to repeat colonoscopy in 3 months due to poor prep.  . COLONOSCOPY WITH PROPOFOL N/A 03/17/2020   Procedure: COLONOSCOPY WITH PROPOFOL;  Surgeon: Daneil Dolin, MD;  [facility];  Service: Endoscopy;  Laterality: N/A;  8:45am - pt does not need covid test, was + 2/4 <90 days  . CORONARY ARTERY BYPASS GRAFT N/A 08/29/2015   Procedure: CORONARY ARTERY BYPASS GRAFTING (CABG);  Surgeon: [name], MD;  Location: [place];  Service: Open Heart Surgery;  Laterality: N/A;  . ESOPHAGOGASTRODUODENOSCOPY (EGD) WITH PROPOFOL N/A 11/22/2019   Procedure: ESOPHAGOGASTRODUODENOSCOPY (EGD) WITH PROPOFOL;  Surgeon: Daneil Dolin, MD; 4 columns of grade 2-3 esophageal varices, portal gastropathy, gastric polyp/abnormal gastric mucosa s/p biopsy.  Pathology with hyperplastic polyp, mild chronic gastritis, negative H. pylori.  [name] KNEE ARTHROSCOPY Left   . TEE WITHOUT CARDIOVERSION N/A 08/29/2015   Procedure: TRANSESOPHAGEAL ECHOCARDIOGRAM (TEE);  Surgeon: [name], MD;  Location: [facility];  Service: Open Heart Surgery;  Laterality: N/A;  . TOTAL KNEE ARTHROPLASTY Left 03/09/2017   Procedure: TOTAL KNEE ARTHROPLASTY;  Surgeon: Carole Civil, MD;  Location: [facility];  Service: Orthopedics;  Laterality: Left;    Vitals:   12/30/20 0854 12/30/20 0919 12/30/20 0922  BP: 125/73 112/66 115/66  Pulse: (!) 104 [name])  108 (!) 109     Subjective Assessment - 12/30/20 0854    Subjective Pt late for apt, stated he got stuck in the snow and had to use shovel to get out of driveway.  Reports increased Rt knee pain due to cold weather.  Waiting on liver transplant prior TKA considered.    Currently in Pain? Yes    Pain Score 7     Pain Location Knee    Pain Orientation Right    Pain Descriptors / Indicators Dull    Pain Type Chronic pain    Pain Onset More than a month ago    Pain Frequency Constant     Aggravating Factors  cold weather    Pain Relieving Factors elevation                             [facility] Adult PT Treatment/Exercise - 12/30/20 0001      Knee/Hip Exercises: Seated   Long Arc Quad 10 reps    Sit to [employer] 5 reps;without UE support   elevated height     Knee/Hip Exercises: Supine   Hip Adduction Isometric Strengthening;10 reps    Hip Adduction Isometric Limitations 5" holds, squeeze towel    Bridges 2 sets;5 reps    Bridges Limitations reduced range    Straight Leg Raises Strengthening;10 reps                  PT Education - 12/30/20 0906    Education Details Reviewed goals, educated importance of HEP compliance.  Pt unable to recall current home exercise program, reviewed this session and printout given    Person(s) Educated Patient    Methods Explanation;Demonstration;Handout    Comprehension Verbalized understanding;Returned demonstration            PT Short Term Goals - 12/24/20 1513      PT SHORT TERM GOAL #1   Title Patient will be independent with HEP in order to improve functional outcomes.    Time 3    Period Weeks    Status New    Target Date 01/14/21      PT SHORT TERM GOAL #2   Title Patient will manifest low risk for falls per time of 12 sec 5 Times Sit to Stand Test    Baseline 17 sec 20" seat height    Time 3    Period Weeks    Status New    Target Date 01/14/21      PT SHORT TERM GOAL #3   Title Patient will demo improved ambulation as evidenced by distance of 300 ft during 2MWT without need for rest period    Baseline 200 ft with LOB and 4/10 RPE    Time 3    Period Weeks    Status New    Target Date 01/14/21             PT Long Term Goals - 12/24/20 1520      PT LONG TERM GOAL #1   Title Patient will be able to navigate stairs with reciprocal pattern without compensation in order to demonstrate improved LE strength.    Baseline BHR and non-reciprocal    Time 6    Period Weeks    Status New     Target Date 02/04/21      PT LONG TERM GOAL #2   Title Patient will demonstrate improved ambulation tolerance as evidenced by distance of 350  ft during 2MWT    Baseline 200 ft w/ LOB    Time 6    Period Weeks    Status New    Target Date 02/04/21                 Plan - 12/30/20 0909    Clinical Impression Statement Reviewed goals, educated importance of HEP compliance for maximal benefits, pt unable to recall current exercise program.  Reviewed current exercises with printout given, verbalized understanding.  Session focus with LE strengthening to assist with functional strengthening.  Elevated height with STS for knee pain control.  Pt c/o lightheadedness following transition supine to sit, vitals assessed.  Pt presents with gluteal weakness noted with inability to reach full range with bridges and required elevated seat height to complete STS without HHA.  EOS reports pain reduced in Rt knee.    Personal Factors and Comorbidities Age;Comorbidity 1;Time since onset of injury/illness/exacerbation    Comorbidities PMH, liver    Examination-Activity Limitations Bed Mobility;Bend;Carry;Lift;Stand;Stairs;Squat;Locomotion Level;Transfers    Examination-Participation Restrictions Community Activity;Yard Work;Meal Prep    Stability/Clinical Decision Making Stable/Uncomplicated    Clinical Decision Making Low    Rehab Potential Good    PT Frequency 2x / week    PT Duration 6 weeks    PT Treatment/Interventions ADLs/Self Care Home Management;Aquatic Therapy;Electrical Stimulation;DME Instruction;Gait training;Stair training;Functional mobility training;Therapeutic activities;Therapeutic exercise;Balance training;Patient/family education;Neuromuscular re-education;Manual techniques;Dry needling;Energy conservation;Spinal Manipulations;Joint Manipulations    PT Next Visit Plan Develop LE PRE to improve activity tolerance    PT Home Exercise Plan SLR, hip add, 1/18: bridge, STS            Patient will benefit from skilled therapeutic intervention in order to improve the following deficits and impairments:  Abnormal gait,Decreased activity tolerance,Decreased balance,Decreased mobility,Decreased knowledge of use of DME,Decreased endurance,Decreased strength,Difficulty walking,Impaired perceived functional ability,Improper body mechanics,Obesity  Visit Diagnosis: Unsteadiness on feet  Muscle weakness (generalized)     Problem List Patient Active Problem List   Diagnosis Date Noted  . Hypertensive heart and kidney disease with heart failure and with chronic kidney disease stage III ([place]) 12/17/2020  . Chronic diastolic heart failure ([place]) 12/17/2020  . Type 2 diabetes mellitus with stage 3b chronic kidney disease and hypertension ([place]) 12/17/2020  . CKD stage 3 due to type 2 diabetes mellitus ([place]) 12/17/2020  . Hyperlipidemia associated with type 2 diabetes mellitus ([place]) 12/17/2020  . Anxiety 12/17/2020  . Chronic non-seasonal allergic rhinitis 12/17/2020  . Hypotension 12/10/2020  . Acute renal failure superimposed on stage 3b chronic kidney disease ([place]) 12/10/2020  . Acute renal failure superimposed on stage 3b chronic kidney disease, unspecified acute renal failure type ([place]) 12/10/2020  . AKI (acute kidney injury) ([place]) 12/09/2020  . GERD (gastroesophageal reflux disease) 10/09/2020  . Anasarca   . Cirrhosis of liver ([place])   . Acute on chronic heart failure with preserved ejection fraction ([place])   . Liver failure ([place]) 06/20/2020  . Esophageal varices ([place]) 01/09/2020  . History of adenomatous polyp of colon 01/09/2020  . Elevated AST (SGOT) 08/23/2019  . Diarrhea 07/17/2019  . Hypersomnia, persistent 03/12/2019  . Macrocytic anemia 01/18/2019  . Other pancytopenia ([place]) 01/18/2019  . Thrombocytopenia ([place]) 12/30/2018  . Polyclonal gammopathy 12/30/2018  . S/P total knee replacement, left 03/09/17 11/22/2017  . Primary osteoarthritis of left knee  03/09/2017  . History of nocturia 03/02/2017  . OSA on CPAP 03/02/2016  . Hypoxia, sleep related 03/02/2016  . Cyst of mediastinum 01/15/2016  . S/P  CABG x 4 08/29/2015  . CAD (coronary artery disease)   . Obesity, Class III, BMI 40-49.9 (morbid obesity) ([place])   . Hyperglycemia   . Pain in the chest 08/23/2015  . Non-ST elevation MI (NSTEMI) ([place]) 08/23/2015  . Diabetes mellitus type 2, uncontrolled ([place]) 08/23/2015  . Hypertension 08/23/2015  . Hyperlipidemia 08/23/2015  . Type 2 diabetes mellitus with hyperglycemia ([place]) 2016  . Class 2 obesity 2016  . Hyponatremia 2016  . Osteoarthritis, knee 05/24/2012  . Knee pain 10/29/2011  . Knee stiffness 10/29/2011  . S/P right knee arthroscopy 10/26/2011  . Medial meniscus, posterior horn derangement 09/07/2011  . Lateral meniscus derangement 09/07/2011  . OA (osteoarthritis) of knee 09/07/2011  . Rotator cuff syndrome of left shoulder 08/19/2011  . Right knee meniscal tear 08/19/2011   Ihor Austin, LPTA/CLT; CBIS [phone]  Aldona Lento 12/30/2020, 9:41 AM  [facility] [address] [place], [place], [address] Phone: [phone]   Fax:  [phone]  Name: [name] MRN: [id number] Date of Birth: 1961/09/24

## 2020-12-30 NOTE — Patient Instructions (Signed)
Straight Leg Raise    Tighten stomach and slowly raise locked right leg 12 inches from floor. Repeat 10 times per set. Do 2 sets per day.  http://orth.exer.us/1102   Copyright  VHI. All rights reserved.

## 2021-01-01 ENCOUNTER — Other Ambulatory Visit: Payer: Self-pay

## 2021-01-01 ENCOUNTER — Encounter: Payer: Self-pay | Admitting: Cardiology

## 2021-01-01 ENCOUNTER — Ambulatory Visit (INDEPENDENT_AMBULATORY_CARE_PROVIDER_SITE_OTHER): Payer: Federal, State, Local not specified - PPO | Admitting: Cardiology

## 2021-01-01 ENCOUNTER — Ambulatory Visit (HOSPITAL_COMMUNITY): Payer: Federal, State, Local not specified - PPO | Admitting: Physical Therapy

## 2021-01-01 VITALS — BP 136/74 | HR 99 | Ht 70.5 in | Wt 261.8 lb

## 2021-01-01 DIAGNOSIS — N1832 Chronic kidney disease, stage 3b: Secondary | ICD-10-CM | POA: Diagnosis not present

## 2021-01-01 DIAGNOSIS — M6281 Muscle weakness (generalized): Secondary | ICD-10-CM | POA: Diagnosis not present

## 2021-01-01 DIAGNOSIS — I25119 Atherosclerotic heart disease of native coronary artery with unspecified angina pectoris: Secondary | ICD-10-CM | POA: Diagnosis not present

## 2021-01-01 DIAGNOSIS — I5032 Chronic diastolic (congestive) heart failure: Secondary | ICD-10-CM

## 2021-01-01 DIAGNOSIS — I25810 Atherosclerosis of coronary artery bypass graft(s) without angina pectoris: Secondary | ICD-10-CM

## 2021-01-01 DIAGNOSIS — Z79899 Other long term (current) drug therapy: Secondary | ICD-10-CM

## 2021-01-01 DIAGNOSIS — R2681 Unsteadiness on feet: Secondary | ICD-10-CM

## 2021-01-01 MED ORDER — TORSEMIDE 20 MG PO TABS: 80.0000 mg | ORAL_TABLET | Freq: Every day | ORAL | 11 refills | Status: DC

## 2021-01-01 NOTE — Patient Instructions (Signed)
Medication Instructions:  Your physician has recommended you make the following change in your medication:   Increase Demadex to 80 mg ( 4 Tablets) in the morning   *If you need a refill on your cardiac medications before your next appointment, please call your pharmacy*   Lab Work: Your physician recommends that you return for lab work in: 7- 10 Days   If you have labs (blood work) drawn today and your tests are completely normal, you will receive your results only by: [name] MyChart Message (if you have MyChart) OR . A paper copy in the mail If you have any lab test that is abnormal or we need to change your treatment, we will call you to review the results.   Testing/Procedures: NONE   Follow-Up: At [facility], you and your health needs are our priority.  As part of our continuing mission to provide you with exceptional heart care, we have created designated Provider Care Teams.  These Care Teams include your primary Cardiologist (physician) and Advanced Practice Providers (APPs -  Physician Assistants and Nurse Practitioners) who all work together to provide you with the care you need, when you need it.  We recommend signing up for the patient portal called "MyChart".  Sign up information is provided on this After Visit Summary.  MyChart is used to connect with patients for Virtual Visits (Telemedicine).  Patients are able to view lab/test results, encounter notes, upcoming appointments, etc.  Non-urgent messages can be sent to your provider as well.   To learn more about what you can do with MyChart, go to [email or web address].    Your next appointment:   4 week(s)  The format for your next appointment:   In Person  Provider:   You will see one of the following Advanced Practice Providers on your designated Care Team:    Dr. Domenic Polite   Other Instructions Thank you for choosing [facility]!

## 2021-01-01 NOTE — Progress Notes (Signed)
Cardiology Office Note  Date: 01/01/2021   ID: [name], [name] [birth year], MRN [id number]  PCP:  [name], MD  Cardiologist:  Rozann Lesches, MD Electrophysiologist:  None   Chief Complaint  Patient presents with  . Cardiac follow-up    History of Present Illness: [name] is a 60 y.o. male last seen in December 2021 by Mr. Leonides Sake NP, I reviewed the note.  I also reviewed interval records, he was hospitalized earlier this month with hypotension in the setting of intravascular volume depletion and acute renal failure. He was hydrated and temporarily taken off all diuretics, also taken off carvedilol. Creatinine peaked at 4.0, most recently down to 1.5.  Since hospital discharge she has been on spironolactone 50 mg daily and Demadex 60 mg daily.  His weight has increased approximately 15 pounds.  He is participating in outpatient rehabilitation, states that his strength is improving.  He has follow-up in the liver transplant clinic at [facility] next week.  I reviewed his medications and we discussed uptitrating his Demadex with close follow-up of renal function.  Past Medical History:  Diagnosis Date  . Anemia   . Arthritis   . CAD (coronary artery disease)    Multivessel disease status post CABG 08/2015  . Cirrhosis ([place])   . CKD (chronic kidney disease) stage 3, GFR 30-59 ml/min (HCC)   . Diastolic CHF ([place])   . Essential hypertension   . Hyperlipidemia   . OSA on CPAP   . Polyclonal gammopathy   . Thrombocytopenia ([place]) 2016  . Type 2 diabetes mellitus ([place])     Past Surgical History:  Procedure Laterality Date  . APPENDECTOMY    . Biceps tendon surgery Right   . BIOPSY  11/22/2019   Procedure: BIOPSY;  Surgeon: Daneil Dolin, MD;  [facility];  Service: Endoscopy;;  . CARDIAC CATHETERIZATION N/A 08/25/2015   Procedure: Left Heart Cath and Coronary Angiography;  Surgeon: Belva Crome, MD;  Location: [place] CV LAB;  Service:  Cardiovascular;  Laterality: N/A;  . COLONOSCOPY WITH PROPOFOL N/A 11/22/2019   Procedure: COLONOSCOPY WITH PROPOFOL;  Surgeon: Daneil Dolin, MD; Four 4-5 mm polyps, findings suggestive of portal colopathy, congested appearing colonic mucosa diffusely, rectal varices, and adequate right colon prep.  Pathology with tubular adenomas and hyperplastic polyp.  Right colon biopsy with focal active colitis.  Recommendations to repeat colonoscopy in 3 months due to poor prep.  . COLONOSCOPY WITH PROPOFOL N/A 03/17/2020   Procedure: COLONOSCOPY WITH PROPOFOL;  Surgeon: Daneil Dolin, MD;  [facility];  Service: Endoscopy;  Laterality: N/A;  8:45am - pt does not need covid test, was + 2/4 <90 days  . CORONARY ARTERY BYPASS GRAFT N/A 08/29/2015   Procedure: CORONARY ARTERY BYPASS GRAFTING (CABG);  Surgeon: [name], MD;  Location: [facility];  Service: Open Heart Surgery;  Laterality: N/A;  . ESOPHAGOGASTRODUODENOSCOPY (EGD) WITH PROPOFOL N/A 11/22/2019   Procedure: ESOPHAGOGASTRODUODENOSCOPY (EGD) WITH PROPOFOL;  Surgeon: Daneil Dolin, MD; 4 columns of grade 2-3 esophageal varices, portal gastropathy, gastric polyp/abnormal gastric mucosa s/p biopsy.  Pathology with hyperplastic polyp, mild chronic gastritis, negative H. pylori.  [name] KNEE ARTHROSCOPY Left   . TEE WITHOUT CARDIOVERSION N/A 08/29/2015   Procedure: TRANSESOPHAGEAL ECHOCARDIOGRAM (TEE);  Surgeon: [name], MD;  Location: [facility];  Service: Open Heart Surgery;  Laterality: N/A;  . TOTAL KNEE ARTHROPLASTY Left 03/09/2017   Procedure: TOTAL KNEE ARTHROPLASTY;  Surgeon: Dorothyann Peng  Vela Prose, MD;  Location: [facility];  Service: Orthopedics;  Laterality: Left;    Current Outpatient Medications  Medication Sig Dispense Refill  . acetaminophen (TYLENOL) 325 MG tablet Take 650 mg by mouth every 6 (six) hours as needed.    [name] albuterol (VENTOLIN HFA) 108 (90 Base) MCG/ACT inhaler Inhale 2 puffs into the lungs every 6 (six) hours  as needed for wheezing or shortness of breath. 18 g 0  . ALPRAZolam (XANAX) 0.5 MG tablet Take 1 tablet (0.5 mg total) by mouth daily as needed. 10 tablet 0  . Armodafinil 200 MG TABS Take 200 mg by mouth daily as needed (for sleepiness). 30 tablet 0  . aspirin EC 81 MG tablet Take 81 mg by mouth at bedtime.     . Cholecalciferol (DIALYVITE VITAMIN D 5000) 125 MCG (5000 UT) capsule Take 5,000 Units by mouth in the morning.     . Continuous Blood Gluc Receiver (FREESTYLE LIBRE 14 DAY READER) DEVI 1 reader 10 each 0  . Continuous Blood Gluc Sensor (FREESTYLE LIBRE 14 DAY SENSOR) MISC 1 sensor    . Elastic Bandages & Supports (MEDICAL COMPRESSION STOCKINGS) MISC 1 each by Does not apply route as directed. Low pressure knee high compression stockings Dx: leg swelling 1 each 0  . ferrous sulfate 325 (65 FE) MG tablet Take 325 mg by mouth in the morning.     . Flaxseed, Linseed, (FLAXSEED OIL) 1000 MG CAPS Take 1,000 mg by mouth at bedtime.    . INS SYRINGE/NEEDLE .5CC/28G 28G X 1/2" 0.5 ML MISC Inject 1 Syringe into the skin See admin instructions. 100 each 0  . insulin aspart (NOVOLOG) 100 UNIT/ML injection Inject 5 Units into the skin 3 (three) times daily before meals. Special Instructions: If accu-check is greater than 150. Hold for accu-check 150 or below. With Meals 10 mL 0  . insulin degludec (TRESIBA FLEXTOUCH) 200 UNIT/ML FlexTouch Pen Inject 40 Units into the skin in the morning. Give 30 units Subcutaneous  at Bedtime 8:00 pm 15 mL 0  . lactulose (CHRONULAC) 10 GM/15ML solution Take 30 mLs (20 g total) by mouth daily. 900 mL 0  . loratadine (CLARITIN) 10 MG tablet Take 10 mg by mouth in the morning.     . Magnesium 250 MG TABS Take 250 mg by mouth daily.    . Misc Natural Products (ELDERBERRY ZINC/VIT C/IMMUNE) LOZG Take 1 lozenge by mouth at bedtime.    . NON FORMULARY Diet: _____ Regular, __x____ NAS, __x_____Consistent Carbohydrate, _______NPO _____Other    . omeprazole (PRILOSEC) 40  MG capsule Take 1 capsule (40 mg total) by mouth at bedtime. 30 capsule 0  . OZEMPIC, 1 MG/DOSE, 4 MG/3ML SOPN Inject 1 mg into the skin once a week. 3 mL 0  . pravastatin (PRAVACHOL) 20 MG tablet TAKE 1 TABLET(20 MG) BY MOUTH AT BEDTIME 30 tablet 0  . spironolactone (ALDACTONE) 100 MG tablet Take 0.5 tablets (50 mg total) by mouth daily. 15 tablet 0  . torsemide (DEMADEX) 20 MG tablet Take 4 tablets (80 mg total) by mouth daily. 120 tablet 11  . TURMERIC CURCUMIN PO Take 2,000 mg by mouth at bedtime.     . potassium chloride SA (KLOR-CON) 20 MEQ tablet Take 20 mEq by mouth 2 (two) times daily.     No current facility-administered medications for this visit.   Allergies:  Fluticasone   ROS: Fluctuating leg swelling.  Physical Exam: VS:  BP 136/74   Pulse 99  Ht 5' 10.5" (1.791 m)   Wt 261 lb 12.8 oz (118.8 kg)   SpO2 100%   BMI 37.03 kg/m , BMI Body mass index is 37.03 kg/m.  Wt Readings from Last 3 Encounters:  01/01/21 261 lb 12.8 oz (118.8 kg)  12/22/20 246 lb 12.8 oz (111.9 kg)  12/19/20 248 lb (112.5 kg)    General: Patient appears comfortable at rest. HEENT: Conjunctiva and lids normal, wearing a mask. Neck: Supple, no elevated JVP. Lungs: Nonlabored breathing, no crackles. Cardiac: Regular rate and rhythm, no S3 or significant systolic murmur. Abdomen: Protuberant, nontender, bowel sounds present. Extremities: Compression stockings in place.  ECG:  An ECG dated 12/09/2020 was personally reviewed today and demonstrated:  Sinus rhythm with low voltage.  Recent Labwork: 06/20/2020: B Natriuretic Peptide 601.0 12/11/2020: TSH 0.414 12/14/2020: Magnesium 1.7 12/19/2020: ALT 13; AST 19; Hemoglobin 11.3; Platelets 105 12/22/2020: BUN 32; Creatinine, Ser 1.50; Potassium 3.8; Sodium 131     Component Value Date/Time   CHOL 120 05/25/2019 1001   TRIG 136 05/25/2019 1001   HDL 38 (L) 05/25/2019 1001   CHOLHDL 3.2 05/25/2019 1001   VLDL 27 05/25/2019 1001   LDLCALC 55  05/25/2019 1001    Other Studies Reviewed Today:  Echocardiogram 05/26/2020: 1. Left ventricular ejection fraction, by estimation, is 60 to 65%. The  left ventricle has normal function. The left ventricle has no regional  wall motion abnormalities. The left ventricular internal cavity size was  mildly dilated. There is mild left  ventricular hypertrophy. Left ventricular diastolic parameters are  indeterminate.  2. Right ventricular systolic function is normal. The right ventricular  size is normal. There is normal pulmonary artery systolic pressure.  3. The mitral valve is abnormal. Mild mitral valve regurgitation.  4. AV is thickened, calcifieid Difficult to see well. Peak and mean  gradients through the valve are 31 and 18 mm Hg respectively consistent  with mild AS. Compared to echo report from 2019, gradients are increased.  . The aortic valve is abnormal. Aortic  valve regurgitation is not visualized. Mild aortic valve stenosis.  5. The inferior vena cava is dilated in size with <50% respiratory  variability, suggesting right atrial pressure of 15 mmHg.   Assessment and Plan:  1. History of recurring anasarca in the setting of chronic diastolic heart failure as well as cirrhosis with portal hypertension and CKD stage IIIb. Recent hospitalization noted.  He has gained 15 pounds since discharge on reduced diuretic dosing.  Follow-up creatinine 1.5 with normal potassium.  Plan to increase Demadex to 80 mg in the morning, continue current dose of spironolactone.  Follow-up BMET in 7 to 10 days.  Clinical visit in 1 month.  2. CKD stage IIIb, recent follow-up creatinine 1.5.  He follows with Dr. Theador Hawthorne.  3. Multivessel CAD status post CABG in 2016.  He reports no active angina and remains on aspirin and Pravachol.  Last LVEF 60 to 65%.  Medication Adjustments/Labs and Tests Ordered: Current medicines are reviewed at length with the patient today.  Concerns regarding medicines  are outlined above.   Tests Ordered: Orders Placed This Encounter  Procedures  . Basic metabolic panel    Medication Changes: Meds ordered this encounter  Medications  . torsemide (DEMADEX) 20 MG tablet    Sig: Take 4 tablets (80 mg total) by mouth daily.    Dispense:  120 tablet    Refill:  11    Disposition:  Follow up 4 to 6 weeks in the  [facility] office.  Signed, Satira Sark, MD, [facility] 01/01/2021 2:11 PM     [facility] [address], [place], [place] [address] Phone: [phone]; Fax: [phone]

## 2021-01-01 NOTE — Therapy (Signed)
[place], [place], [address] Phone: [phone]   Fax:  [phone]  Physical Therapy Treatment  Patient Details  Name: [name] MRN: [id number] Date of Birth: 1961/11/21 Referring Provider (PT): Dr. [name]   Encounter Date: 01/01/2021   PT End of Session - 01/01/21 1537    Number of Visits 12    Date for PT Re-Evaluation 02/04/21    Authorization Type BCBS/Federal employee, no auth, no VL    PT Start Time 1455    PT Stop Time 1536    PT Time Calculation (min) 41 min    Activity Tolerance Patient tolerated treatment well;Patient limited by fatigue    Behavior During Therapy [facility] for tasks assessed/performed           Past Medical History:  Diagnosis Date  . Anemia   . Arthritis   . CAD (coronary artery disease)    Multivessel disease status post CABG 08/2015  . Cirrhosis ([place])   . CKD (chronic kidney disease) stage 3, GFR 30-59 ml/min (HCC)   . Diastolic CHF ([place])   . Essential hypertension   . Hyperlipidemia   . OSA on CPAP   . Polyclonal gammopathy   . Thrombocytopenia ([place]) 2016  . Type 2 diabetes mellitus ([place])     Past Surgical History:  Procedure Laterality Date  . APPENDECTOMY    . Biceps tendon surgery Right   . BIOPSY  11/22/2019   Procedure: BIOPSY;  Surgeon: Daneil Dolin, MD;  [facility];  Service: Endoscopy;;  . CARDIAC CATHETERIZATION N/A 08/25/2015   Procedure: Left Heart Cath and Coronary Angiography;  Surgeon: Belva Crome, MD;  Location: [place] [facility];  Service: Cardiovascular;  Laterality: N/A;  . COLONOSCOPY WITH PROPOFOL N/A 11/22/2019   Procedure: COLONOSCOPY WITH PROPOFOL;  Surgeon: Daneil Dolin, MD; Four 4-5 mm polyps, findings suggestive of portal colopathy, congested appearing colonic mucosa diffusely, rectal varices, and adequate right colon prep.  Pathology with tubular adenomas and hyperplastic polyp.  Right colon biopsy with  focal active colitis.  Recommendations to repeat colonoscopy in 3 months due to poor prep.  . COLONOSCOPY WITH PROPOFOL N/A 03/17/2020   Procedure: COLONOSCOPY WITH PROPOFOL;  Surgeon: Daneil Dolin, MD;  [facility];  Service: Endoscopy;  Laterality: N/A;  8:45am - pt does not need covid test, was + 2/4 <90 days  . CORONARY ARTERY BYPASS GRAFT N/A 08/29/2015   Procedure: CORONARY ARTERY BYPASS GRAFTING (CABG);  Surgeon: [name], MD;  Location: [place];  Service: Open Heart Surgery;  Laterality: N/A;  . ESOPHAGOGASTRODUODENOSCOPY (EGD) WITH PROPOFOL N/A 11/22/2019   Procedure: ESOPHAGOGASTRODUODENOSCOPY (EGD) WITH PROPOFOL;  Surgeon: Daneil Dolin, MD; 4 columns of grade 2-3 esophageal varices, portal gastropathy, gastric polyp/abnormal gastric mucosa s/p biopsy.  Pathology with hyperplastic polyp, mild chronic gastritis, negative H. pylori.  [name] KNEE ARTHROSCOPY Left   . TEE WITHOUT CARDIOVERSION N/A 08/29/2015   Procedure: TRANSESOPHAGEAL ECHOCARDIOGRAM (TEE);  Surgeon: [name], MD;  Location: [place];  Service: Open Heart Surgery;  Laterality: N/A;  . TOTAL KNEE ARTHROPLASTY Left 03/09/2017   Procedure: TOTAL KNEE ARTHROPLASTY;  Surgeon: Carole Civil, MD;  [facility];  Service: Orthopedics;  Laterality: Left;    There were no vitals filed for this visit.   Subjective Assessment - 01/01/21 1505    Subjective Pt states his Rt knee remains stiff and sore; states the rainy weather doesn't  help and makes it achey    Currently in Pain? Yes    Pain Score 6     Pain Location Knee    Pain Orientation Right    Pain Descriptors / Indicators Dull    Pain Type Chronic pain                             [facility] Adult PT Treatment/Exercise - 01/01/21 0001      Knee/Hip Exercises: Seated   Long Arc Quad 10 reps    Sit to [employer] 10 reps;without UE support      Knee/Hip Exercises: Supine   Hip Adduction Isometric Strengthening;2 sets;10 reps     Hip Adduction Isometric Limitations 5" holds, squeeze towel    Bridges 2 sets;10 reps    Bridges Limitations reduced range    Straight Leg Raises Strengthening;10 reps;2 sets;Both      Knee/Hip Exercises: Sidelying   Hip ABduction Both;2 sets;10 reps                    PT Short Term Goals - 12/24/20 1513      PT SHORT TERM GOAL #1   Title Patient will be independent with HEP in order to improve functional outcomes.    Time 3    Period Weeks    Status New    Target Date 01/14/21      PT SHORT TERM GOAL #2   Title Patient will manifest low risk for falls per time of 12 sec 5 Times Sit to Stand Test    Baseline 17 sec 20" seat height    Time 3    Period Weeks    Status New    Target Date 01/14/21      PT SHORT TERM GOAL #3   Title Patient will demo improved ambulation as evidenced by distance of 300 ft during 2MWT without need for rest period    Baseline 200 ft with LOB and 4/10 RPE    Time 3    Period Weeks    Status New    Target Date 01/14/21             PT Long Term Goals - 12/24/20 1520      PT LONG TERM GOAL #1   Title Patient will be able to navigate stairs with reciprocal pattern without compensation in order to demonstrate improved LE strength.    Baseline BHR and non-reciprocal    Time 6    Period Weeks    Status New    Target Date 02/04/21      PT LONG TERM GOAL #2   Title Patient will demonstrate improved ambulation tolerance as evidenced by distance of 350 ft during 2MWT    Baseline 200 ft w/ LOB    Time 6    Period Weeks    Status New    Target Date 02/04/21                 Plan - 01/01/21 1535    Clinical Impression Statement Continued with established POC with additional sets/reps added.  Pt required frequent rest breaks due to fatigue.  Little cues needed as tends to complete slowly and controlled.  Added sidelying hip abduction with tactile cues to maintain "stacked" form and utilize hip abductor rather than flexor.   Noted challenge with this added exercise.    Personal Factors and Comorbidities Age;Comorbidity 1;Time since onset of injury/illness/exacerbation  Comorbidities PMH, liver    Examination-Activity Limitations Bed Mobility;Bend;Carry;Lift;Stand;Stairs;Squat;Locomotion Level;Transfers    Examination-Participation Restrictions Community Activity;Yard Work;Meal Prep    Stability/Clinical Decision Making Stable/Uncomplicated    Rehab Potential Good    PT Frequency 2x / week    PT Duration 6 weeks    PT Treatment/Interventions ADLs/Self Care Home Management;Aquatic Therapy;Electrical Stimulation;DME Instruction;Gait training;Stair training;Functional mobility training;Therapeutic activities;Therapeutic exercise;Balance training;Patient/family education;Neuromuscular re-education;Manual techniques;Dry needling;Energy conservation;Spinal Manipulations;Joint Manipulations    PT Next Visit Plan continue to improve LE strength and activity tolerance.  Add nustep at EOS next visit to work on this (NB).    PT Home Exercise Plan SLR, hip add, 1/18: bridge, STS           Patient will benefit from skilled therapeutic intervention in order to improve the following deficits and impairments:  Abnormal gait,Decreased activity tolerance,Decreased balance,Decreased mobility,Decreased knowledge of use of DME,Decreased endurance,Decreased strength,Difficulty walking,Impaired perceived functional ability,Improper body mechanics,Obesity  Visit Diagnosis: No diagnosis found.     Problem List Patient Active Problem List   Diagnosis Date Noted  . Hypertensive heart and kidney disease with heart failure and with chronic kidney disease stage III ([place]) 12/17/2020  . Chronic diastolic heart failure ([place]) 12/17/2020  . Type 2 diabetes mellitus with stage 3b chronic kidney disease and hypertension ([place]) 12/17/2020  . CKD stage 3 due to type 2 diabetes mellitus ([place]) 12/17/2020  . Hyperlipidemia associated with type 2  diabetes mellitus ([place]) 12/17/2020  . Anxiety 12/17/2020  . Chronic non-seasonal allergic rhinitis 12/17/2020  . Hypotension 12/10/2020  . Acute renal failure superimposed on stage 3b chronic kidney disease ([place]) 12/10/2020  . Acute renal failure superimposed on stage 3b chronic kidney disease, unspecified acute renal failure type ([place]) 12/10/2020  . AKI (acute kidney injury) ([place]) 12/09/2020  . GERD (gastroesophageal reflux disease) 10/09/2020  . Anasarca   . Cirrhosis of liver ([place])   . Acute on chronic heart failure with preserved ejection fraction ([place])   . Liver failure ([place]) 06/20/2020  . Esophageal varices ([place]) 01/09/2020  . History of adenomatous polyp of colon 01/09/2020  . Elevated AST (SGOT) 08/23/2019  . Diarrhea 07/17/2019  . Hypersomnia, persistent 03/12/2019  . Macrocytic anemia 01/18/2019  . Other pancytopenia ([place]) 01/18/2019  . Thrombocytopenia ([place]) 12/30/2018  . Polyclonal gammopathy 12/30/2018  . S/P total knee replacement, left 03/09/17 11/22/2017  . Primary osteoarthritis of left knee 03/09/2017  . History of nocturia 03/02/2017  . OSA on CPAP 03/02/2016  . Hypoxia, sleep related 03/02/2016  . Cyst of mediastinum 01/15/2016  . S/P CABG x 4 08/29/2015  . CAD (coronary artery disease)   . Obesity, Class III, BMI 40-49.9 (morbid obesity) ([place])   . Hyperglycemia   . Pain in the chest 08/23/2015  . Non-ST elevation MI (NSTEMI) ([place]) 08/23/2015  . Diabetes mellitus type 2, uncontrolled ([place]) 08/23/2015  . Hypertension 08/23/2015  . Hyperlipidemia 08/23/2015  . Type 2 diabetes mellitus with hyperglycemia ([place]) 2016  . Class 2 obesity 2016  . Hyponatremia 2016  . Osteoarthritis, knee 05/24/2012  . Knee pain 10/29/2011  . Knee stiffness 10/29/2011  . S/P right knee arthroscopy 10/26/2011  . Medial meniscus, posterior horn derangement 09/07/2011  . Lateral meniscus derangement 09/07/2011  . OA (osteoarthritis) of knee 09/07/2011  . Rotator cuff syndrome of  left shoulder 08/19/2011  . Right knee meniscal tear 08/19/2011   Teena Irani, PTA/CLT [phone]  Roseanne Reno B 01/01/2021, 3:47 PM  [place]  [place], [place], [address] Phone: [phone]   Fax:  [phone]  Name: HENDRIX YURKOVICH MRN: [id number] Date of Birth: 1961/04/26

## 2021-01-02 ENCOUNTER — Telehealth: Payer: Self-pay | Admitting: Cardiology

## 2021-01-02 NOTE — Telephone Encounter (Signed)
  Pt c/o medication issue:  1. Name of Medication: torsemide (DEMADEX) 20 MG tablet.  2. How are you currently taking this medication (dosage and times per day)? NA  3. Are you having a reaction (difficulty breathing--STAT)? No  4. What is your medication issue? Patient's PCP put him back on his medication after recent hospital visit. He said it is causing his BP to drop. He would like to know if he should continue taking this medication.

## 2021-01-02 NOTE — Telephone Encounter (Signed)
Contacted patient. Explained to him that Dr. Domenic Polite increased his Demadex to 80 mg in the mornings. He verbalized understanding and will continue to take the medication the way it was prescribed.

## 2021-01-06 ENCOUNTER — Ambulatory Visit (HOSPITAL_COMMUNITY): Payer: Federal, State, Local not specified - PPO

## 2021-01-06 ENCOUNTER — Telehealth (HOSPITAL_COMMUNITY): Payer: Self-pay

## 2021-01-06 DIAGNOSIS — Z79899 Other long term (current) drug therapy: Secondary | ICD-10-CM | POA: Diagnosis not present

## 2021-01-06 DIAGNOSIS — R188 Other ascites: Secondary | ICD-10-CM | POA: Diagnosis not present

## 2021-01-06 DIAGNOSIS — I252 Old myocardial infarction: Secondary | ICD-10-CM | POA: Diagnosis not present

## 2021-01-06 DIAGNOSIS — I2510 Atherosclerotic heart disease of native coronary artery without angina pectoris: Secondary | ICD-10-CM | POA: Diagnosis not present

## 2021-01-06 DIAGNOSIS — Z01818 Encounter for other preprocedural examination: Secondary | ICD-10-CM | POA: Diagnosis not present

## 2021-01-06 DIAGNOSIS — R0600 Dyspnea, unspecified: Secondary | ICD-10-CM | POA: Diagnosis not present

## 2021-01-06 DIAGNOSIS — G4733 Obstructive sleep apnea (adult) (pediatric): Secondary | ICD-10-CM | POA: Diagnosis not present

## 2021-01-06 DIAGNOSIS — R011 Cardiac murmur, unspecified: Secondary | ICD-10-CM | POA: Diagnosis not present

## 2021-01-06 DIAGNOSIS — K7581 Nonalcoholic steatohepatitis (NASH): Secondary | ICD-10-CM | POA: Diagnosis not present

## 2021-01-06 DIAGNOSIS — I350 Nonrheumatic aortic (valve) stenosis: Secondary | ICD-10-CM | POA: Diagnosis not present

## 2021-01-06 DIAGNOSIS — E1165 Type 2 diabetes mellitus with hyperglycemia: Secondary | ICD-10-CM | POA: Diagnosis not present

## 2021-01-06 DIAGNOSIS — I5033 Acute on chronic diastolic (congestive) heart failure: Secondary | ICD-10-CM | POA: Diagnosis not present

## 2021-01-06 DIAGNOSIS — E785 Hyperlipidemia, unspecified: Secondary | ICD-10-CM | POA: Diagnosis not present

## 2021-01-06 DIAGNOSIS — Z9049 Acquired absence of other specified parts of digestive tract: Secondary | ICD-10-CM | POA: Diagnosis not present

## 2021-01-06 DIAGNOSIS — K7460 Unspecified cirrhosis of liver: Secondary | ICD-10-CM | POA: Diagnosis not present

## 2021-01-06 DIAGNOSIS — I8510 Secondary esophageal varices without bleeding: Secondary | ICD-10-CM | POA: Diagnosis not present

## 2021-01-06 DIAGNOSIS — I130 Hypertensive heart and chronic kidney disease with heart failure and stage 1 through stage 4 chronic kidney disease, or unspecified chronic kidney disease: Secondary | ICD-10-CM | POA: Diagnosis not present

## 2021-01-06 DIAGNOSIS — Z833 Family history of diabetes mellitus: Secondary | ICD-10-CM | POA: Diagnosis not present

## 2021-01-06 DIAGNOSIS — Z8249 Family history of ischemic heart disease and other diseases of the circulatory system: Secondary | ICD-10-CM | POA: Diagnosis not present

## 2021-01-06 DIAGNOSIS — E1122 Type 2 diabetes mellitus with diabetic chronic kidney disease: Secondary | ICD-10-CM | POA: Diagnosis not present

## 2021-01-06 DIAGNOSIS — N189 Chronic kidney disease, unspecified: Secondary | ICD-10-CM | POA: Diagnosis not present

## 2021-01-06 DIAGNOSIS — Z01810 Encounter for preprocedural cardiovascular examination: Secondary | ICD-10-CM | POA: Diagnosis not present

## 2021-01-06 DIAGNOSIS — Z951 Presence of aortocoronary bypass graft: Secondary | ICD-10-CM | POA: Diagnosis not present

## 2021-01-06 DIAGNOSIS — K766 Portal hypertension: Secondary | ICD-10-CM | POA: Diagnosis not present

## 2021-01-06 DIAGNOSIS — Z794 Long term (current) use of insulin: Secondary | ICD-10-CM | POA: Diagnosis not present

## 2021-01-06 DIAGNOSIS — Z6839 Body mass index (BMI) 39.0-39.9, adult: Secondary | ICD-10-CM | POA: Diagnosis not present

## 2021-01-06 NOTE — Telephone Encounter (Signed)
pt cancelled appt for today because he has to go to [facility]

## 2021-01-08 ENCOUNTER — Ambulatory Visit (HOSPITAL_COMMUNITY): Payer: Federal, State, Local not specified - PPO

## 2021-01-08 ENCOUNTER — Telehealth: Payer: Self-pay

## 2021-01-08 ENCOUNTER — Other Ambulatory Visit (HOSPITAL_COMMUNITY)
Admission: RE | Admit: 2021-01-08 | Discharge: 2021-01-08 | Disposition: A | Discharge status: Home / Self Care | Payer: Federal, State, Local not specified - PPO | Source: Ambulatory Visit | Attending: Cardiology | Admitting: Cardiology

## 2021-01-08 ENCOUNTER — Encounter (HOSPITAL_COMMUNITY): Payer: Self-pay

## 2021-01-08 ENCOUNTER — Other Ambulatory Visit: Payer: Self-pay

## 2021-01-08 DIAGNOSIS — R2681 Unsteadiness on feet: Secondary | ICD-10-CM | POA: Diagnosis not present

## 2021-01-08 DIAGNOSIS — M6281 Muscle weakness (generalized): Secondary | ICD-10-CM | POA: Diagnosis not present

## 2021-01-08 DIAGNOSIS — Z79899 Other long term (current) drug therapy: Secondary | ICD-10-CM | POA: Diagnosis not present

## 2021-01-08 LAB — BASIC METABOLIC PANEL
Anion gap: 6 (ref 5–15)
BUN: 35 mg/dL — ABNORMAL HIGH (ref 8–23)
CO2: 22 mmol/L (ref 22–32)
Calcium: 9.1 mg/dL (ref 8.9–10.3)
Chloride: 102 mmol/L (ref 98–111)
Creatinine, Ser: 2.42 mg/dL — ABNORMAL HIGH (ref 0.61–1.24)
GFR, Estimated: 30 mL/min — ABNORMAL LOW (ref >60)
Glucose, Bld: 265 mg/dL — ABNORMAL HIGH (ref 70–99)
Potassium: 5.5 mmol/L — ABNORMAL HIGH (ref 3.5–5.1)
Sodium: 130 mmol/L — ABNORMAL LOW (ref 135–145)

## 2021-01-08 NOTE — Therapy (Signed)
[place], [place], [address] Phone: [phone]   Fax:  [phone]  Physical Therapy Treatment  Patient Details  Name: [name] MRN: [id number] Date of Birth: 28-Mar-1961 Referring Provider (PT): Dr. [name]   Encounter Date: 01/08/2021   PT End of Session - 01/08/21 0846    Visit Number 4    Number of Visits 12    Date for PT Re-Evaluation 02/04/21    Authorization Type BCBS/Federal employee, no auth, no VL    PT Start Time [phone]   pt late sign in   PT Stop Time 0919    PT Time Calculation (min) 40 min    Activity Tolerance Patient tolerated treatment well;Patient limited by fatigue    Behavior During Therapy [facility] for tasks assessed/performed           Past Medical History:  Diagnosis Date  . Anemia   . Arthritis   . CAD (coronary artery disease)    Multivessel disease status post CABG 08/2015  . Cirrhosis ([place])   . CKD (chronic kidney disease) stage 3, GFR 30-59 ml/min (HCC)   . Diastolic CHF ([place])   . Essential hypertension   . Hyperlipidemia   . OSA on CPAP   . Polyclonal gammopathy   . Thrombocytopenia ([place]) 2016  . Type 2 diabetes mellitus ([place])     Past Surgical History:  Procedure Laterality Date  . APPENDECTOMY    . Biceps tendon surgery Right   . BIOPSY  11/22/2019   Procedure: BIOPSY;  Surgeon: Daneil Dolin, MD;  [facility];  Service: Endoscopy;;  . CARDIAC CATHETERIZATION N/A 08/25/2015   Procedure: Left Heart Cath and Coronary Angiography;  Surgeon: Belva Crome, MD;  Location: [facility];  Service: Cardiovascular;  Laterality: N/A;  . COLONOSCOPY WITH PROPOFOL N/A 11/22/2019   Procedure: COLONOSCOPY WITH PROPOFOL;  Surgeon: Daneil Dolin, MD; Four 4-5 mm polyps, findings suggestive of portal colopathy, congested appearing colonic mucosa diffusely, rectal varices, and adequate right colon prep.  Pathology with tubular adenomas and hyperplastic  polyp.  Right colon biopsy with focal active colitis.  Recommendations to repeat colonoscopy in 3 months due to poor prep.  . COLONOSCOPY WITH PROPOFOL N/A 03/17/2020   Procedure: COLONOSCOPY WITH PROPOFOL;  Surgeon: Daneil Dolin, MD;  [facility];  Service: Endoscopy;  Laterality: N/A;  8:45am - pt does not need covid test, was + 2/4 <90 days  . CORONARY ARTERY BYPASS GRAFT N/A 08/29/2015   Procedure: CORONARY ARTERY BYPASS GRAFTING (CABG);  Surgeon: [name], MD;  Location: [facility];  Service: Open Heart Surgery;  Laterality: N/A;  . ESOPHAGOGASTRODUODENOSCOPY (EGD) WITH PROPOFOL N/A 11/22/2019   Procedure: ESOPHAGOGASTRODUODENOSCOPY (EGD) WITH PROPOFOL;  Surgeon: Daneil Dolin, MD; 4 columns of grade 2-3 esophageal varices, portal gastropathy, gastric polyp/abnormal gastric mucosa s/p biopsy.  Pathology with hyperplastic polyp, mild chronic gastritis, negative H. pylori.  [name] KNEE ARTHROSCOPY Left   . TEE WITHOUT CARDIOVERSION N/A 08/29/2015   Procedure: TRANSESOPHAGEAL ECHOCARDIOGRAM (TEE);  Surgeon: [name], MD;  Location: [facility];  Service: Open Heart Surgery;  Laterality: N/A;  . TOTAL KNEE ARTHROPLASTY Left 03/09/2017   Procedure: TOTAL KNEE ARTHROPLASTY;  Surgeon: Carole Civil, MD;  [facility];  Service: Orthopedics;  Laterality: Left;    There were no vitals filed for this visit.   Subjective Assessment - 01/08/21 0844    Subjective Pt stated he  is tired today, stated he was at [facility] for 7 hrs on Tuesday and walked close to 6 hrs.    Currently in Pain? Yes    Pain Score 6     Pain Location Knee    Pain Orientation Right    Pain Descriptors / Indicators Sharp    Pain Type Chronic pain    Pain Onset More than a month ago    Pain Frequency Constant    Aggravating Factors  cold weather    Pain Relieving Factors elevation                             [facility] Adult PT Treatment/Exercise - 01/08/21 0001      Knee/Hip  Exercises: Aerobic   Nustep 5' UE/LE L2      Knee/Hip Exercises: Standing   Heel Raises 10 reps    Heel Raises Limitations toe raises with UE Support    Hip Abduction 10 reps    Hip Extension 10 reps      Knee/Hip Exercises: Seated   Sit to Sand 15 reps;without UE support   eccentric control     Knee/Hip Exercises: Supine   Bridges 2 sets;10 reps    Bridges Limitations reduced range      Knee/Hip Exercises: Sidelying   Hip ABduction Both;2 sets;10 reps                    PT Short Term Goals - 12/24/20 1513      PT SHORT TERM GOAL #1   Title Patient will be independent with HEP in order to improve functional outcomes.    Time 3    Period Weeks    Status New    Target Date 01/14/21      PT SHORT TERM GOAL #2   Title Patient will manifest low risk for falls per time of 12 sec 5 Times Sit to Stand Test    Baseline 17 sec 20" seat height    Time 3    Period Weeks    Status New    Target Date 01/14/21      PT SHORT TERM GOAL #3   Title Patient will demo improved ambulation as evidenced by distance of 300 ft during 2MWT without need for rest period    Baseline 200 ft with LOB and 4/10 RPE    Time 3    Period Weeks    Status New    Target Date 01/14/21             PT Long Term Goals - 12/24/20 1520      PT LONG TERM GOAL #1   Title Patient will be able to navigate stairs with reciprocal pattern without compensation in order to demonstrate improved LE strength.    Baseline BHR and non-reciprocal    Time 6    Period Weeks    Status New    Target Date 02/04/21      PT LONG TERM GOAL #2   Title Patient will demonstrate improved ambulation tolerance as evidenced by distance of 350 ft during 2MWT    Baseline 200 ft w/ LOB    Time 6    Period Weeks    Status New    Target Date 02/04/21                 Plan - 01/08/21 0850    Clinical Impression Statement Continued wiht established POC  for LE strengthening.  Pt continues to exhibit quick  fatigue and weakness.  Pt able to present with good control with exercises, did require verbal and tactile cueing for proper form to reduce compensation wiht hip flexors vs abductors and decreased range with bridges due to weakness.  Progressed to closed chain exercises this session with good tolerance.  Did utilize UE support for balance with exercises.  EOS on nustep for activity tolerance.    Personal Factors and Comorbidities Age;Comorbidity 1;Time since onset of injury/illness/exacerbation    Comorbidities PMH, liver    Examination-Activity Limitations Bed Mobility;Bend;Carry;Lift;Stand;Stairs;Squat;Locomotion Level;Transfers    Examination-Participation Restrictions Community Activity;Yard Work;Meal Prep    Stability/Clinical Decision Making Stable/Uncomplicated    Rehab Potential Good    PT Frequency 2x / week    PT Duration 6 weeks    PT Treatment/Interventions ADLs/Self Care Home Management;Aquatic Therapy;Electrical Stimulation;DME Instruction;Gait training;Stair training;Functional mobility training;Therapeutic activities;Therapeutic exercise;Balance training;Patient/family education;Neuromuscular re-education;Manual techniques;Dry needling;Energy conservation;Spinal Manipulations;Joint Manipulations    PT Next Visit Plan continue to improve LE strength and activity tolerance.  Add nustep at EOS next visit to work on this (NB).    PT Home Exercise Plan SLR, hip add, 1/18: bridge, STS; 1/27: heel/toe raise and sidelying abd           Patient will benefit from skilled therapeutic intervention in order to improve the following deficits and impairments:  Abnormal gait,Decreased activity tolerance,Decreased balance,Decreased mobility,Decreased knowledge of use of DME,Decreased endurance,Decreased strength,Difficulty walking,Impaired perceived functional ability,Improper body mechanics,Obesity  Visit Diagnosis: Unsteadiness on feet  Muscle weakness (generalized)     Problem  List Patient Active Problem List   Diagnosis Date Noted  . Hypertensive heart and kidney disease with heart failure and with chronic kidney disease stage III ([place]) 12/17/2020  . Chronic diastolic heart failure ([place]) 12/17/2020  . Type 2 diabetes mellitus with stage 3b chronic kidney disease and hypertension ([place]) 12/17/2020  . CKD stage 3 due to type 2 diabetes mellitus ([place]) 12/17/2020  . Hyperlipidemia associated with type 2 diabetes mellitus ([place]) 12/17/2020  . Anxiety 12/17/2020  . Chronic non-seasonal allergic rhinitis 12/17/2020  . Hypotension 12/10/2020  . Acute renal failure superimposed on stage 3b chronic kidney disease ([place]) 12/10/2020  . Acute renal failure superimposed on stage 3b chronic kidney disease, unspecified acute renal failure type ([place]) 12/10/2020  . AKI (acute kidney injury) ([place]) 12/09/2020  . GERD (gastroesophageal reflux disease) 10/09/2020  . Anasarca   . Cirrhosis of liver ([place])   . Acute on chronic heart failure with preserved ejection fraction ([place])   . Liver failure ([place]) 06/20/2020  . Esophageal varices ([place]) 01/09/2020  . History of adenomatous polyp of colon 01/09/2020  . Elevated AST (SGOT) 08/23/2019  . Diarrhea 07/17/2019  . Hypersomnia, persistent 03/12/2019  . Macrocytic anemia 01/18/2019  . Other pancytopenia ([place]) 01/18/2019  . Thrombocytopenia ([place]) 12/30/2018  . Polyclonal gammopathy 12/30/2018  . S/P total knee replacement, left 03/09/17 11/22/2017  . Primary osteoarthritis of left knee 03/09/2017  . History of nocturia 03/02/2017  . OSA on CPAP 03/02/2016  . Hypoxia, sleep related 03/02/2016  . Cyst of mediastinum 01/15/2016  . S/P CABG x 4 08/29/2015  . CAD (coronary artery disease)   . Obesity, Class III, BMI 40-49.9 (morbid obesity) ([place])   . Hyperglycemia   . Pain in the chest 08/23/2015  . Non-ST elevation MI (NSTEMI) ([place]) 08/23/2015  . Diabetes mellitus type 2, uncontrolled ([place]) 08/23/2015  . Hypertension 08/23/2015  .  Hyperlipidemia 08/23/2015  . Type  2 diabetes mellitus with hyperglycemia ([place]) 2016  . Class 2 obesity 2016  . Hyponatremia 2016  . Osteoarthritis, knee 05/24/2012  . Knee pain 10/29/2011  . Knee stiffness 10/29/2011  . S/P right knee arthroscopy 10/26/2011  . Medial meniscus, posterior horn derangement 09/07/2011  . Lateral meniscus derangement 09/07/2011  . OA (osteoarthritis) of knee 09/07/2011  . Rotator cuff syndrome of left shoulder 08/19/2011  . Right knee meniscal tear 08/19/2011   Ihor Austin, LPTA/CLT; CBIS [phone]  Aldona Lento 01/08/2021, 9:16 AM  [facility] [address] [place], [place], [address] Phone: [phone]   Fax:  [phone]  Name: [name] MRN: [id number] Date of Birth: 1961/07/13

## 2021-01-08 NOTE — Telephone Encounter (Signed)
-----   Message from Satira Sark, MD sent at 01/08/2021 12:35 PM EST ----- Results reviewed.  Creatinine has bumped back up to 2.42, previously down to 1.50 on lowered dose diuretics.  Check and see how he is doing in terms of fluid loss since we titrated his Demadex at last office visit.  He had gained about 15 pounds and we increased his Demadex to 80 mg daily at that time.

## 2021-01-08 NOTE — Patient Instructions (Addendum)
Abduction: Side Leg Lift (Eccentric) - Side-Lying    Lie on side. Lift top leg slightly higher than shoulder level. Keep top leg straight with body, toes pointing forward.  Slowly lower for 3-5 seconds.  10 reps per set, 1 sets per day, 4 days per week.  http://ecce.exer.us/62   Copyright  VHI. All rights reserved.   Toe / Heel Raise (Standing)    Standing with support, raise heels, then rock back on heels and raise toes. Repeat 10 times.  Copyright  VHI. All rights reserved.

## 2021-01-08 NOTE — Telephone Encounter (Signed)
Mr.Pang states he was weighed at [facility] on Tuesday, 1/25 and weighed 251 lbs ( ten lbs weight since since our office visit on 1/20) He states the physician at [facility] wants him to decrease Torsemide to 20 mg daily. He has lab work ordered by them for next week. He is still taking Torsemide 80 mg daily.

## 2021-01-08 NOTE — Telephone Encounter (Signed)
He can follow the instructions given him by [facility] providers since he was just recently assessed there and had lab work.  I think unfortunately we are going to deal with an up-and-down situation in terms of his weight fluctuations and renal function.

## 2021-01-09 ENCOUNTER — Emergency Department (HOSPITAL_COMMUNITY): Payer: Federal, State, Local not specified - PPO

## 2021-01-09 ENCOUNTER — Other Ambulatory Visit: Payer: Self-pay

## 2021-01-09 ENCOUNTER — Emergency Department (HOSPITAL_COMMUNITY)
Admission: EM | Admit: 2021-01-09 | Discharge: 2021-01-09 | Disposition: A | Discharge status: Home / Self Care | Payer: Federal, State, Local not specified - PPO | Attending: Emergency Medicine | Admitting: Emergency Medicine

## 2021-01-09 ENCOUNTER — Encounter (HOSPITAL_COMMUNITY): Payer: Self-pay | Admitting: *Deleted

## 2021-01-09 DIAGNOSIS — E785 Hyperlipidemia, unspecified: Secondary | ICD-10-CM | POA: Diagnosis not present

## 2021-01-09 DIAGNOSIS — Y92009 Unspecified place in unspecified non-institutional (private) residence as the place of occurrence of the external cause: Secondary | ICD-10-CM | POA: Insufficient documentation

## 2021-01-09 DIAGNOSIS — I130 Hypertensive heart and chronic kidney disease with heart failure and stage 1 through stage 4 chronic kidney disease, or unspecified chronic kidney disease: Secondary | ICD-10-CM | POA: Insufficient documentation

## 2021-01-09 DIAGNOSIS — I2510 Atherosclerotic heart disease of native coronary artery without angina pectoris: Secondary | ICD-10-CM | POA: Insufficient documentation

## 2021-01-09 DIAGNOSIS — W010XXA Fall on same level from slipping, tripping and stumbling without subsequent striking against object, initial encounter: Secondary | ICD-10-CM | POA: Diagnosis not present

## 2021-01-09 DIAGNOSIS — N1832 Chronic kidney disease, stage 3b: Secondary | ICD-10-CM | POA: Insufficient documentation

## 2021-01-09 DIAGNOSIS — E1122 Type 2 diabetes mellitus with diabetic chronic kidney disease: Secondary | ICD-10-CM | POA: Insufficient documentation

## 2021-01-09 DIAGNOSIS — E1169 Type 2 diabetes mellitus with other specified complication: Secondary | ICD-10-CM | POA: Insufficient documentation

## 2021-01-09 DIAGNOSIS — Z794 Long term (current) use of insulin: Secondary | ICD-10-CM | POA: Diagnosis not present

## 2021-01-09 DIAGNOSIS — I5042 Chronic combined systolic (congestive) and diastolic (congestive) heart failure: Secondary | ICD-10-CM | POA: Diagnosis not present

## 2021-01-09 DIAGNOSIS — R52 Pain, unspecified: Secondary | ICD-10-CM | POA: Diagnosis not present

## 2021-01-09 DIAGNOSIS — M25519 Pain in unspecified shoulder: Secondary | ICD-10-CM | POA: Diagnosis not present

## 2021-01-09 DIAGNOSIS — E1165 Type 2 diabetes mellitus with hyperglycemia: Secondary | ICD-10-CM | POA: Insufficient documentation

## 2021-01-09 DIAGNOSIS — Z7982 Long term (current) use of aspirin: Secondary | ICD-10-CM | POA: Diagnosis not present

## 2021-01-09 DIAGNOSIS — Z96652 Presence of left artificial knee joint: Secondary | ICD-10-CM | POA: Insufficient documentation

## 2021-01-09 DIAGNOSIS — M25512 Pain in left shoulder: Secondary | ICD-10-CM

## 2021-01-09 DIAGNOSIS — W19XXXA Unspecified fall, initial encounter: Secondary | ICD-10-CM | POA: Diagnosis not present

## 2021-01-09 MED ORDER — TORSEMIDE 20 MG PO TABS: 20.0000 mg | ORAL_TABLET | Freq: Every day | ORAL | 3 refills | Status: DC

## 2021-01-09 NOTE — Discharge Instructions (Addendum)
As we discussed today I would recommend that for at least the next few days you wear your ring on your other hand as any arm injury can cause the fingers to swell and you don't want your ring to get stuck on your finger and cut off circulation.    I have given you a sling for comfort.  Please try to wear it as little as possible.  As we discussed you can develop a condition called frozen shoulder where your shoulder gets very stiff that can be hard to fix.  If it is more comfortable for you to do so you may wear it at night and sit in a recliner for the next few days to sleep.  Please make sure you are taking the sling off multiple times a day to gently move your arm including hand/wrist, elbow, and shoulder as much as your pain allows without forcing anything.  While we had originally discussed using Tylenol as needed, based on your liver dysfunction and kidney dysfunction I would strongly caution against using either ibuprofens or Tylenol (unless your liver doctor and medical team have told you you can safely take these).  I would strongly recommend trying to manage this with rest, ice for the first 2 days followed by heat, gentle stretching, gentle range of motion.

## 2021-01-09 NOTE — Telephone Encounter (Signed)
I spoke with patient. He will reduce Torsemide to 20 mg qd and follow instructions with the [facility] team.

## 2021-01-09 NOTE — ED Provider Notes (Signed)
[facility] EMERGENCY DEPARTMENT Provider Note   CSN: [id number] Arrival date & time: 01/09/21  2016     History Chief Complaint  Patient presents with  . Fall    [name] is a 60 y.o. male with a past medical history of CAD, cirrhosis, CKD, preliver transplant, DM, CHF, who presents today for evaluation of pain in his left shoulder.  He states that he was outside walking when he had a mechanical nonsyncopal trip and fall over some rocks at his house landing on his left shoulder.  He did not strike his head or pass out.  He does not take anticoagulants.  He denies any pain in his head, neck, or back.  He reports his only injury is his left shoulder.  He denies pain in his left elbow wrist or hand.  He states that his pain is made worse with movement, made better with being still.    HPI     Past Medical History:  Diagnosis Date  . Anemia   . Arthritis   . CAD (coronary artery disease)    Multivessel disease status post CABG 08/2015  . Cirrhosis ([place])   . CKD (chronic kidney disease) stage 3, GFR 30-59 ml/min (HCC)   . Diastolic CHF ([place])   . Essential hypertension   . Hyperlipidemia   . OSA on CPAP   . Polyclonal gammopathy   . Thrombocytopenia ([place]) 2016  . Type 2 diabetes mellitus [facility])     Patient Active Problem List   Diagnosis Date Noted  . Hypertensive heart and kidney disease with heart failure and with chronic kidney disease stage III ([place]) 12/17/2020  . Chronic diastolic heart failure ([place]) 12/17/2020  . Type 2 diabetes mellitus with stage 3b chronic kidney disease and hypertension ([place]) 12/17/2020  . CKD stage 3 due to type 2 diabetes mellitus ([place]) 12/17/2020  . Hyperlipidemia associated with type 2 diabetes mellitus ([place]) 12/17/2020  . Anxiety 12/17/2020  . Chronic non-seasonal allergic rhinitis 12/17/2020  . Hypotension 12/10/2020  . Acute renal failure superimposed on stage 3b chronic kidney disease ([place]) 12/10/2020  . Acute renal failure  superimposed on stage 3b chronic kidney disease, unspecified acute renal failure type ([place]) 12/10/2020  . AKI (acute kidney injury) ([place]) 12/09/2020  . GERD (gastroesophageal reflux disease) 10/09/2020  . Anasarca   . Cirrhosis of liver ([place])   . Acute on chronic heart failure with preserved ejection fraction ([place])   . Liver failure ([place]) 06/20/2020  . Esophageal varices ([place]) 01/09/2020  . History of adenomatous polyp of colon 01/09/2020  . Elevated AST (SGOT) 08/23/2019  . Diarrhea 07/17/2019  . Hypersomnia, persistent 03/12/2019  . Macrocytic anemia 01/18/2019  . Other pancytopenia ([place]) 01/18/2019  . Thrombocytopenia ([place]) 12/30/2018  . Polyclonal gammopathy 12/30/2018  . S/P total knee replacement, left 03/09/17 11/22/2017  . Primary osteoarthritis of left knee 03/09/2017  . History of nocturia 03/02/2017  . OSA on CPAP 03/02/2016  . Hypoxia, sleep related 03/02/2016  . Cyst of mediastinum 01/15/2016  . S/P CABG x 4 08/29/2015  . CAD (coronary artery disease)   . Obesity, Class III, BMI 40-49.9 (morbid obesity) ([place])   . Hyperglycemia   . Pain in the chest 08/23/2015  . Non-ST elevation MI (NSTEMI) ([place]) 08/23/2015  . Diabetes mellitus type 2, uncontrolled ([place]) 08/23/2015  . Hypertension 08/23/2015  . Hyperlipidemia 08/23/2015  . Type 2 diabetes mellitus with hyperglycemia ([place]) 2016  . Class 2 obesity 2016  . Hyponatremia 2016  .  Osteoarthritis, knee 05/24/2012  . Knee pain 10/29/2011  . Knee stiffness 10/29/2011  . S/P right knee arthroscopy 10/26/2011  . Medial meniscus, posterior horn derangement 09/07/2011  . Lateral meniscus derangement 09/07/2011  . OA (osteoarthritis) of knee 09/07/2011  . Rotator cuff syndrome of left shoulder 08/19/2011  . Right knee meniscal tear 08/19/2011    Past Surgical History:  Procedure Laterality Date  . APPENDECTOMY    . Biceps tendon surgery Right   . BIOPSY  11/22/2019   Procedure: BIOPSY;  Surgeon: Daneil Dolin, MD;   Location: [facility] ENDO SU[facility];  Service: Endoscopy;;  . CARDIAC CATHETERIZATION N/A 08/25/2015   Procedure: Left Heart Cath and Coronary Angiography;  Surgeon: Belva Crome, MD;  Location: [place] CV LAB;  Service: Cardiovascular;  Laterality: N/A;  . COLONOSCOPY WITH PROPOFOL N/A 11/22/2019   Procedure: COLONOSCOPY WITH PROPOFOL;  Surgeon: Daneil Dolin, MD; Four 4-5 mm polyps, findings suggestive of portal colopathy, congested appearing colonic mucosa diffusely, rectal varices, and adequate right colon prep.  Pathology with tubular adenomas and hyperplastic polyp.  Right colon biopsy with focal active colitis.  Recommendations to repeat colonoscopy in 3 months due to poor prep.  . COLONOSCOPY WITH PROPOFOL N/A 03/17/2020   Procedure: COLONOSCOPY WITH PROPOFOL;  Surgeon: Daneil Dolin, MD;  Location: [facility] END[facility];  Service: Endoscopy;  Laterality: N/A;  8:45am - pt does not need covid test, was + 2/4 <90 days  . CORONARY ARTERY BYPASS GRAFT N/A 08/29/2015   Procedure: CORONARY ARTERY BYPASS GRAFTING (CABG);  Surgeon: [name], MD;  Location: [place];  Service: Open Heart Surgery;  Laterality: N/A;  . ESOPHAGOGASTRODUODENOSCOPY (EGD) WITH PROPOFOL N/A 11/22/2019   Procedure: ESOPHAGOGASTRODUODENOSCOPY (EGD) WITH PROPOFOL;  Surgeon: Daneil Dolin, MD; 4 columns of grade 2-3 esophageal varices, portal gastropathy, gastric polyp/abnormal gastric mucosa s/p biopsy.  Pathology with hyperplastic polyp, mild chronic gastritis, negative H. pylori.  [name] KNEE ARTHROSCOPY Left   . TEE WITHOUT CARDIOVERSION N/A 08/29/2015   Procedure: TRANSESOPHAGEAL ECHOCARDIOGRAM (TEE);  Surgeon: [name], MD;  Location: [place];  Service: Open Heart Surgery;  Laterality: N/A;  . TOTAL KNEE ARTHROPLASTY Left 03/09/2017   Procedure: TOTAL KNEE ARTHROPLASTY;  Surgeon: Carole Civil, MD;  Location: [facility] ORS;  Service: Orthopedics;  Laterality: Left;       Family History  Problem Relation Age of Onset  .  Arthritis Other   . Cancer Other   . Diabetes Other   . CAD Father   . Diabetes Mellitus II Father   . Liver cancer Father   . Hodgkin's lymphoma Father   . CAD Brother   . Diabetes Mellitus II Brother   . ALS Mother   . Diabetes Mellitus II Sister   . Diabetes Mellitus II Brother   . Diabetes Mellitus II Maternal Grandmother   . Aneurysm Maternal Grandmother   . Cancer Maternal Grandfather   . Anesthesia problems Neg Hx   . Hypotension Neg Hx   . Malignant hyperthermia Neg Hx   . Pseudochol deficiency Neg Hx   . Colon cancer Neg Hx     Social History   Tobacco Use  . Smoking status: Never Smoker  . Smokeless tobacco: Never Used  Vaping Use  . Vaping Use: Never used  Substance Use Topics  . Alcohol use: No    Alcohol/week: 0.0 standard drinks  . Drug use: No    Home Medications Prior to Admission medications   Medication Sig Start Date  End Date Taking? Authorizing Provider  acetaminophen (TYLENOL) 325 MG tablet Take 650 mg by mouth every 6 (six) hours as needed. 12/19/20   [provider]  albuterol (VENTOLIN HFA) 108 (90 Base) MCG/ACT inhaler Inhale 2 puffs into the lungs every 6 (six) hours as needed for wheezing or shortness of breath. 12/22/20   Gerlene Fee, NP  ALPRAZolam Duanne Moron) 0.5 MG tablet Take 1 tablet (0.5 mg total) by mouth daily as needed. 12/22/20   Gerlene Fee, NP  Armodafinil 200 MG TABS Take 200 mg by mouth daily as needed (for sleepiness). 12/22/20   Gerlene Fee, NP  aspirin EC 81 MG tablet Take 81 mg by mouth at bedtime.     [provider]  Cholecalciferol (DIALYVITE VITAMIN D 5000) 125 MCG (5000 UT) capsule Take 5,000 Units by mouth in the morning.     [provider]  Continuous Blood Gluc Receiver (FREESTYLE LIBRE 14 DAY READER) DEVI 1 reader 12/22/20   Gerlene Fee, NP  Continuous Blood Gluc Sensor (FREESTYLE LIBRE 14 DAY SENSOR) MISC 1 sensor 12/28/17   [provider]  Elastic Bandages & Supports  ([place]) [place] 1 each by Does not apply route as directed. Low pressure knee high compression stockings Dx: leg swelling 05/13/20   Satira Sark, MD  ferrous sulfate 325 (65 FE) MG tablet Take 325 mg by mouth in the morning.     [provider]  Flaxseed, Linseed, (FLAXSEED OIL) 1000 MG CAPS Take 1,000 mg by mouth at bedtime. 12/16/20   [provider]  INS SYRINGE/NEEDLE .5CC/28G 28G X 1/2" 0.5 ML MISC Inject 1 Syringe into the skin See admin instructions. 12/22/20   Gerlene Fee, NP  insulin aspart (NOVOLOG) 100 UNIT/ML injection Inject 5 Units into the skin 3 (three) times daily before meals. Special Instructions: If accu-check is greater than 150. Hold for accu-check 150 or below. With Meals 12/22/20   Gerlene Fee, NP  insulin degludec (TRESIBA FLEXTOUCH) 200 UNIT/ML FlexTouch Pen Inject 40 Units into the skin in the morning. Give 30 units Subcutaneous  at Bedtime 8:00 pm 12/22/20   Gerlene Fee, NP  lactulose (CHRONULAC) 10 GM/15ML solution Take 30 mLs (20 g total) by mouth daily. 12/22/20   Gerlene Fee, NP  loratadine (CLARITIN) 10 MG tablet Take 10 mg by mouth in the morning.     [provider]  Magnesium 250 MG TABS Take 250 mg by mouth daily.    [provider]  Misc Natural Products (ELDERBERRY ZINC/VIT C/IMMUNE) LOZG Take 1 lozenge by mouth at bedtime. 12/16/20   [provider]  NON FORMULARY Diet: _____ Regular, __x____ NAS, __x_____Consistent Carbohydrate, _______NPO _____Other 12/16/20   [provider]  omeprazole (PRILOSEC) 40 MG capsule Take 1 capsule (40 mg total) by mouth at bedtime. 12/22/20   Gerlene Fee, NP  OZEMPIC, 1 MG/DOSE, 4 MG/3ML SOPN Inject 1 mg into the skin once a week. 12/22/20   Gerlene Fee, NP  potassium chloride SA (KLOR-CON) 20 MEQ tablet Take 20 mEq by mouth 2 (two) times daily. 12/31/20   [provider]  pravastatin (PRAVACHOL) 20 MG tablet TAKE 1  TABLET(20 MG) BY MOUTH AT BEDTIME 12/22/20   Gerlene Fee, NP  spironolactone (ALDACTONE) 100 MG tablet Take 0.5 tablets (50 mg total) by mouth daily. 12/22/20   Gerlene Fee, NP  torsemide (DEMADEX) 20 MG tablet Take 1 tablet (20 mg total) by mouth  daily. 01/09/21 04/09/21  Satira Sark, MD  TURMERIC CURCUMIN PO Take 2,000 mg by mouth at bedtime.     [provider]    Allergies    Fluticasone  Review of Systems   Review of Systems  Constitutional: Negative for chills and fever.  Respiratory: Negative for cough and shortness of breath.   Cardiovascular: Negative for chest pain.  Musculoskeletal: Negative for back pain and neck pain.       Left lateral shoulder pain  Neurological: Negative for weakness and headaches.  All other systems reviewed and are negative.   Physical Exam Updated Vital Signs BP 126/76   Pulse 88   Temp 98 F (36.7 C) (Oral)   Resp 16   Ht 5' 10.5" (1.791 m)   Wt 116.1 kg   SpO2 98%   BMI 36.21 kg/m   Physical Exam Vitals and nursing note reviewed.  Constitutional:      General: He is not in acute distress.    Appearance: He is not ill-appearing.  HENT:     Head: Normocephalic and atraumatic.  Cardiovascular:     Rate and Rhythm: Normal rate.     Comments: 2+ left radial pulse, left arm/hand is warm and well-perfused. Pulmonary:     Effort: Pulmonary effort is normal. No respiratory distress.  Musculoskeletal:     Cervical back: No rigidity or tenderness.     Comments: Patient has pain with palpation over the left lateral shoulder.  Palpation here both recreates and exacerbates his reported pain.  No crepitus or deformity along the left clavicle or along the superior aspect of the left shoulder posteriorly.  Compartments in left arm are soft and easily compressible.  5/5 left grip strength.  Full active range of motion of fingers on left hand, left hand, left wrist and elbow.  Range of motion of the left shoulder recreates and  exacerbates his reported pain. No C/T-spine midline tenderness to palpation, step-offs, or deformities.  Skin:    General: Skin is warm and dry.     Comments: No abrasion, skin break, or tear visualized on the left lateral shoulder  Neurological:     Mental Status: He is alert. Mental status is at baseline.     Sensory: No sensory deficit (Sensation intact to light touch to left upper extremity.).     Comments: Awake and alert, answers all questions appropriately.  Speech is not slurred.    Psychiatric:        Mood and Affect: Mood normal.     ED Results / Procedures / Treatments   Labs (all labs ordered are listed, but only abnormal results are displayed) Labs Reviewed - No data to display  EKG None  Radiology DG Shoulder Left  Result Date: 01/09/2021 CLINICAL DATA:  Recent fall with left shoulder pain, initial encounter EXAM: LEFT SHOULDER - 2+ VIEW COMPARISON:  None. FINDINGS: Mild degenerative changes of the acromioclavicular joint are seen. No acute fracture or dislocation is noted. No soft tissue abnormality is seen. IMPRESSION: No acute abnormality noted. Electronically Signed   By: Inez Catalina M.D.   On: 01/09/2021 21:05    Procedures Procedures   Medications Ordered in ED Medications - No data to display  ED Course  I have reviewed the triage vital signs and the nursing notes.  Pertinent labs & imaging results that were available during my care of the patient were reviewed by me and considered in my medical decision making (see chart for details).  MDM Rules/Calculators/A&P                         Patient is a 60 year old man who presents today for evaluation of pain in his left shoulder.  He had a mechanical, nonsyncopal trip and fall where he landed on his left shoulder shortly prior to arrival.  X-rays are obtained without fracture or dislocation. On exam he has pain with range of motion of the left shoulder.  Compartments in left upper extremity are soft and  easily compressible and he is neurovascularly intact.  I recommended that he wear his ring on the opposite hand for the next few days in case he develops any finger swelling.  On review patient has both liver and kidney dysfunction/failure and is followed at [facility] for possibility of transplant.  I recommended that he avoid ibuprofen and Tylenol and less his primary specialist have told him that it is okay.  I recommended rest, ice.  He is given a sling to help with comfort with instructions that he needs to frequently remove it for range of motion.  He did not strike his head, does not have any pain in his head, neck, or upper back and denies any injuries other than his left shoulder.  Return precautions were discussed with patient who states their understanding.  At the time of discharge patient denied any unaddressed complaints or concerns.  Patient is agreeable for discharge home.  Note: Portions of this report may have been transcribed using voice recognition software. Every effort was made to ensure accuracy; however, inadvertent computerized transcription errors may be present  Final Clinical Impression(s) / ED Diagnoses Final diagnoses:  Fall, initial encounter  Acute pain of left shoulder    Rx / DC Orders ED Discharge Orders    None       Ollen Gross 01/09/21 [id number]    Milton Ferguson, MD 01/10/21 1842

## 2021-01-09 NOTE — ED Triage Notes (Signed)
Pt tripped over some rocks outside at home and landed on left shoulder. C/o left shoulder pain.  Pt denies hitting his head with fall.

## 2021-01-12 ENCOUNTER — Encounter (HOSPITAL_COMMUNITY): Payer: Federal, State, Local not specified - PPO

## 2021-01-12 ENCOUNTER — Telehealth (HOSPITAL_COMMUNITY): Payer: Self-pay

## 2021-01-12 NOTE — Telephone Encounter (Signed)
pt cancelled appt for today because he fell in the yard over the weekend and his arm is in a sling.

## 2021-01-14 ENCOUNTER — Other Ambulatory Visit: Payer: Self-pay

## 2021-01-14 ENCOUNTER — Encounter (HOSPITAL_COMMUNITY): Payer: Self-pay

## 2021-01-14 ENCOUNTER — Ambulatory Visit (HOSPITAL_COMMUNITY): Payer: Federal, State, Local not specified - PPO | Attending: Family Medicine

## 2021-01-14 DIAGNOSIS — R2681 Unsteadiness on feet: Secondary | ICD-10-CM | POA: Insufficient documentation

## 2021-01-14 DIAGNOSIS — M6281 Muscle weakness (generalized): Secondary | ICD-10-CM | POA: Insufficient documentation

## 2021-01-14 NOTE — Therapy (Signed)
[facility] [address] [place], [place], [address] Phone: [phone]   Fax:  [phone]  Physical Therapy Treatment  Patient Details  Name: [name] MRN: [id number] Date of Birth: 1960-12-21 Referring Provider (PT): Dr. [name]   Encounter Date: 01/14/2021   PT End of Session - 01/14/21 0844    Visit Number 5    Number of Visits 12    Date for PT Re-Evaluation 02/04/21    Authorization Type BCBS/Federal employee, no auth, no VL    PT Start Time [phone]   late arrival   PT Stop Time 0918    PT Time Calculation (min) 41 min    Equipment Utilized During Treatment Gait belt    Activity Tolerance Patient tolerated treatment well;Patient limited by fatigue    Behavior During Therapy [name] for tasks assessed/performed           Past Medical History:  Diagnosis Date  . Anemia   . Arthritis   . CAD (coronary artery disease)    Multivessel disease status post CABG 08/2015  . Cirrhosis ([place])   . CKD (chronic kidney disease) stage 3, GFR 30-59 ml/min (HCC)   . Diastolic CHF ([place])   . Essential hypertension   . Hyperlipidemia   . OSA on CPAP   . Polyclonal gammopathy   . Thrombocytopenia ([place]) 2016  . Type 2 diabetes mellitus ([place])     Past Surgical History:  Procedure Laterality Date  . APPENDECTOMY    . Biceps tendon surgery Right   . BIOPSY  11/22/2019   Procedure: BIOPSY;  Surgeon: Daneil Dolin, MD;  [facility];  Service: Endoscopy;;  . CARDIAC CATHETERIZATION N/A 08/25/2015   Procedure: Left Heart Cath and Coronary Angiography;  Surgeon: Belva Crome, MD;  Location: [facility];  Service: Cardiovascular;  Laterality: N/A;  . COLONOSCOPY WITH PROPOFOL N/A 11/22/2019   Procedure: COLONOSCOPY WITH PROPOFOL;  Surgeon: Daneil Dolin, MD; Four 4-5 mm polyps, findings suggestive of portal colopathy, congested appearing colonic mucosa diffusely, rectal varices, and adequate right colon prep.   Pathology with tubular adenomas and hyperplastic polyp.  Right colon biopsy with focal active colitis.  Recommendations to repeat colonoscopy in 3 months due to poor prep.  . COLONOSCOPY WITH PROPOFOL N/A 03/17/2020   Procedure: COLONOSCOPY WITH PROPOFOL;  Surgeon: Daneil Dolin, MD;  [facility];  Service: Endoscopy;  Laterality: N/A;  8:45am - pt does not need covid test, was + 2/4 <90 days  . CORONARY ARTERY BYPASS GRAFT N/A 08/29/2015   Procedure: CORONARY ARTERY BYPASS GRAFTING (CABG);  Surgeon: [name], MD;  Location: [facility];  Service: Open Heart Surgery;  Laterality: N/A;  . ESOPHAGOGASTRODUODENOSCOPY (EGD) WITH PROPOFOL N/A 11/22/2019   Procedure: ESOPHAGOGASTRODUODENOSCOPY (EGD) WITH PROPOFOL;  Surgeon: Daneil Dolin, MD; 4 columns of grade 2-3 esophageal varices, portal gastropathy, gastric polyp/abnormal gastric mucosa s/p biopsy.  Pathology with hyperplastic polyp, mild chronic gastritis, negative H. pylori.  [name] KNEE ARTHROSCOPY Left   . TEE WITHOUT CARDIOVERSION N/A 08/29/2015   Procedure: TRANSESOPHAGEAL ECHOCARDIOGRAM (TEE);  Surgeon: [name], MD;  Location: [facility];  Service: Open Heart Surgery;  Laterality: N/A;  . TOTAL KNEE ARTHROPLASTY Left 03/09/2017   Procedure: TOTAL KNEE ARTHROPLASTY;  Surgeon: Carole Civil, MD;  Location: [facility];  Service: Orthopedics;  Laterality: Left;    There were no vitals filed for this visit.   Subjective Assessment - 01/14/21 1962  Subjective Pt reports a fall in his garden with rain and landed on Lt shoulder.  Required EMS to held him up off the ground.  Wears sling occasionally for pain control.  Reports wife has signed up for [employer], plans to begin in next couple weeks.    Currently in Pain? Yes    Pain Score 6     Pain Location Shoulder    Pain Orientation Left    Pain Descriptors / Indicators Aching;Sore    Pain Type Acute pain    Pain Onset In the past 7 days    Pain Frequency  Intermittent    Aggravating Factors  cold weather    Pain Relieving Factors elevation                             [facility] Adult PT Treatment/Exercise - 01/14/21 0001      Knee/Hip Exercises: Aerobic   Nustep 5' LE only L2      Knee/Hip Exercises: Standing   Heel Raises 2 sets;10 reps    Heel Raises Limitations toe raises with UE Support    Hip Abduction 10 reps    Hip Extension 10 reps      Knee/Hip Exercises: Seated   Sit to Sand 10 reps;without UE support   eccentric control              Balance Exercises - 01/14/21 0001      Balance Exercises: Standing   Tandem Stance Eyes open;2 reps;30 secs    SLS 5 reps   Lt 11", Rt 15" max   Retro Gait 2 reps    Sidestepping 2 reps               PT Short Term Goals - 12/24/20 1513      PT SHORT TERM GOAL #1   Title Patient will be independent with HEP in order to improve functional outcomes.    Time 3    Period Weeks    Status New    Target Date 01/14/21      PT SHORT TERM GOAL #2   Title Patient will manifest low risk for falls per time of 12 sec 5 Times Sit to Stand Test    Baseline 17 sec 20" seat height    Time 3    Period Weeks    Status New    Target Date 01/14/21      PT SHORT TERM GOAL #3   Title Patient will demo improved ambulation as evidenced by distance of 300 ft during 2MWT without need for rest period    Baseline 200 ft with LOB and 4/10 RPE    Time 3    Period Weeks    Status New    Target Date 01/14/21             PT Long Term Goals - 12/24/20 1520      PT LONG TERM GOAL #1   Title Patient will be able to navigate stairs with reciprocal pattern without compensation in order to demonstrate improved LE strength.    Baseline BHR and non-reciprocal    Time 6    Period Weeks    Status New    Target Date 02/04/21      PT LONG TERM GOAL #2   Title Patient will demonstrate improved ambulation tolerance as evidenced by distance of 350 ft during 2MWT    Baseline 200 ft  w/ LOB  Time 6    Period Weeks    Status New    Target Date 02/04/21                 Plan - 01/14/21 1346    Clinical Impression Statement Session focus wiht functional strengthening as well as additional balance training following fall earlier this week.  Added static tandem stance and gait associated balalnce activities.  Pt with min guard/A for safety with static balance activities required HHA and presents with good step stragety with LOB.  Cueing for posture to assist wtih balance.  Nustep complete LE only at EOS due to Lt shoulder pain.    Personal Factors and Comorbidities Age;Comorbidity 1;Time since onset of injury/illness/exacerbation    Comorbidities PMH, liver    Examination-Activity Limitations Bed Mobility;Bend;Carry;Lift;Stand;Stairs;Squat;Locomotion Level;Transfers    Examination-Participation Restrictions Community Activity;Yard Work;Meal Prep    Stability/Clinical Decision Making Stable/Uncomplicated    Clinical Decision Making Low    Rehab Potential Good    PT Frequency 2x / week    PT Duration 6 weeks    PT Treatment/Interventions ADLs/Self Care Home Management;Aquatic Therapy;Electrical Stimulation;DME Instruction;Gait training;Stair training;Functional mobility training;Therapeutic activities;Therapeutic exercise;Balance training;Patient/family education;Neuromuscular re-education;Manual techniques;Dry needling;Energy conservation;Spinal Manipulations;Joint Manipulations    PT Next Visit Plan Continue to improve LE strength and activity tolerance.  Next session progress balance activities (add toe tapping) and add machines (educate proper weight and mechanics) for beginning [employer].    PT Home Exercise Plan SLR, hip add, 1/18: bridge, STS; 1/27: heel/toe raise and sidelying abd; 2/2: tandem stance and SLS by counter           Patient will benefit from skilled therapeutic intervention in order to improve the following deficits and impairments:  Abnormal  gait,Decreased activity tolerance,Decreased balance,Decreased mobility,Decreased knowledge of use of DME,Decreased endurance,Decreased strength,Difficulty walking,Impaired perceived functional ability,Improper body mechanics,Obesity  Visit Diagnosis: Unsteadiness on feet  Muscle weakness (generalized)     Problem List Patient Active Problem List   Diagnosis Date Noted  . Hypertensive heart and kidney disease with heart failure and with chronic kidney disease stage III ([place]) 12/17/2020  . Chronic diastolic heart failure ([place]) 12/17/2020  . Type 2 diabetes mellitus with stage 3b chronic kidney disease and hypertension ([place]) 12/17/2020  . CKD stage 3 due to type 2 diabetes mellitus ([place]) 12/17/2020  . Hyperlipidemia associated with type 2 diabetes mellitus ([place]) 12/17/2020  . Anxiety 12/17/2020  . Chronic non-seasonal allergic rhinitis 12/17/2020  . Hypotension 12/10/2020  . Acute renal failure superimposed on stage 3b chronic kidney disease ([place]) 12/10/2020  . Acute renal failure superimposed on stage 3b chronic kidney disease, unspecified acute renal failure type ([place]) 12/10/2020  . AKI (acute kidney injury) ([place]) 12/09/2020  . GERD (gastroesophageal reflux disease) 10/09/2020  . Anasarca   . Cirrhosis of liver ([place])   . Acute on chronic heart failure with preserved ejection fraction ([place])   . Liver failure ([place]) 06/20/2020  . Esophageal varices ([place]) 01/09/2020  . History of adenomatous polyp of colon 01/09/2020  . Elevated AST (SGOT) 08/23/2019  . Diarrhea 07/17/2019  . Hypersomnia, persistent 03/12/2019  . Macrocytic anemia 01/18/2019  . Other pancytopenia ([place]) 01/18/2019  . Thrombocytopenia ([place]) 12/30/2018  . Polyclonal gammopathy 12/30/2018  . S/P total knee replacement, left 03/09/17 11/22/2017  . Primary osteoarthritis of left knee 03/09/2017  . History of nocturia 03/02/2017  . OSA on CPAP 03/02/2016  . Hypoxia, sleep related 03/02/2016  . Cyst of mediastinum  01/15/2016  . S/P CABG x  4 08/29/2015  . CAD (coronary artery disease)   . Obesity, Class III, BMI 40-49.9 (morbid obesity) ([place])   . Hyperglycemia   . Pain in the chest 08/23/2015  . Non-ST elevation MI (NSTEMI) ([place]) 08/23/2015  . Diabetes mellitus type 2, uncontrolled ([place]) 08/23/2015  . Hypertension 08/23/2015  . Hyperlipidemia 08/23/2015  . Type 2 diabetes mellitus with hyperglycemia ([place]) 2016  . Class 2 obesity 2016  . Hyponatremia 2016  . Osteoarthritis, knee 05/24/2012  . Knee pain 10/29/2011  . Knee stiffness 10/29/2011  . S/P right knee arthroscopy 10/26/2011  . Medial meniscus, posterior horn derangement 09/07/2011  . Lateral meniscus derangement 09/07/2011  . OA (osteoarthritis) of knee 09/07/2011  . Rotator cuff syndrome of left shoulder 08/19/2011  . Right knee meniscal tear 08/19/2011   [name], LPTA/CLT; CBIS [phone]  [name] 01/14/2021, 1:54 PM  [facility] [address] [place], [place], [address] Phone: [phone]   Fax:  [phone]  Name: [name] MRN: [id number] Date of Birth: November 28, 1961

## 2021-01-14 NOTE — Patient Instructions (Signed)
Complete all balance activities by counter of sink for safety.  Tandem Stance    Right foot in front of left, heel touching toe both feet "straight ahead". Stand on Foot Triangle of Support with both feet. Balance in this position 30 seconds. Do with left foot in front of right.  Copyright  VHI. All rights reserved.   SINGLE LIMB STANCE    Stance: single leg on floor. Raise leg. Hold 30 seconds. Repeat with other leg.  Copyright  VHI. All rights reserved.

## 2021-01-15 DIAGNOSIS — Z01818 Encounter for other preprocedural examination: Secondary | ICD-10-CM | POA: Diagnosis not present

## 2021-01-16 ENCOUNTER — Other Ambulatory Visit: Payer: Self-pay | Admitting: Adult Health

## 2021-01-16 DIAGNOSIS — R0609 Other forms of dyspnea: Secondary | ICD-10-CM | POA: Diagnosis not present

## 2021-01-16 DIAGNOSIS — R0600 Dyspnea, unspecified: Secondary | ICD-10-CM | POA: Diagnosis not present

## 2021-01-20 ENCOUNTER — Ambulatory Visit (HOSPITAL_COMMUNITY): Payer: Federal, State, Local not specified - PPO

## 2021-01-20 ENCOUNTER — Telehealth (HOSPITAL_COMMUNITY): Payer: Self-pay

## 2021-01-20 NOTE — Telephone Encounter (Signed)
Called and spoke with patient who reports not feeling well and with new onset of fever.  Pt educated on importance of symptom monitoring and reporting to follow up with most salient healthcare provider.  9:57 AM, 01/20/21 M. Sherlyn Lees, PT, DPT Physical Therapist- [place] Office Number: [phone]

## 2021-01-22 ENCOUNTER — Encounter (HOSPITAL_COMMUNITY): Payer: Federal, State, Local not specified - PPO

## 2021-01-22 ENCOUNTER — Ambulatory Visit: Payer: Federal, State, Local not specified - PPO | Admitting: Orthopedic Surgery

## 2021-01-22 DIAGNOSIS — R161 Splenomegaly, not elsewhere classified: Secondary | ICD-10-CM | POA: Diagnosis not present

## 2021-01-22 DIAGNOSIS — G8929 Other chronic pain: Secondary | ICD-10-CM | POA: Diagnosis not present

## 2021-01-22 DIAGNOSIS — I700 Atherosclerosis of aorta: Secondary | ICD-10-CM | POA: Diagnosis not present

## 2021-01-22 DIAGNOSIS — Z111 Encounter for screening for respiratory tuberculosis: Secondary | ICD-10-CM | POA: Diagnosis not present

## 2021-01-22 DIAGNOSIS — K760 Fatty (change of) liver, not elsewhere classified: Secondary | ICD-10-CM | POA: Diagnosis not present

## 2021-01-22 DIAGNOSIS — Z7682 Awaiting organ transplant status: Secondary | ICD-10-CM | POA: Diagnosis not present

## 2021-01-22 DIAGNOSIS — I350 Nonrheumatic aortic (valve) stenosis: Secondary | ICD-10-CM | POA: Diagnosis not present

## 2021-01-22 DIAGNOSIS — R935 Abnormal findings on diagnostic imaging of other abdominal regions, including retroperitoneum: Secondary | ICD-10-CM | POA: Diagnosis not present

## 2021-01-22 DIAGNOSIS — I2510 Atherosclerotic heart disease of native coronary artery without angina pectoris: Secondary | ICD-10-CM | POA: Diagnosis not present

## 2021-01-22 DIAGNOSIS — Z800 Family history of malignant neoplasm of digestive organs: Secondary | ICD-10-CM | POA: Diagnosis not present

## 2021-01-22 DIAGNOSIS — Z1159 Encounter for screening for other viral diseases: Secondary | ICD-10-CM | POA: Diagnosis not present

## 2021-01-22 DIAGNOSIS — K7460 Unspecified cirrhosis of liver: Secondary | ICD-10-CM | POA: Diagnosis not present

## 2021-01-22 DIAGNOSIS — N179 Acute kidney failure, unspecified: Secondary | ICD-10-CM | POA: Diagnosis not present

## 2021-01-22 DIAGNOSIS — E1121 Type 2 diabetes mellitus with diabetic nephropathy: Secondary | ICD-10-CM | POA: Diagnosis not present

## 2021-01-22 DIAGNOSIS — M25561 Pain in right knee: Secondary | ICD-10-CM | POA: Diagnosis not present

## 2021-01-22 DIAGNOSIS — E1165 Type 2 diabetes mellitus with hyperglycemia: Secondary | ICD-10-CM | POA: Diagnosis not present

## 2021-01-22 DIAGNOSIS — Z8249 Family history of ischemic heart disease and other diseases of the circulatory system: Secondary | ICD-10-CM | POA: Diagnosis not present

## 2021-01-22 DIAGNOSIS — K766 Portal hypertension: Secondary | ICD-10-CM | POA: Diagnosis not present

## 2021-01-22 DIAGNOSIS — I5033 Acute on chronic diastolic (congestive) heart failure: Secondary | ICD-10-CM | POA: Diagnosis not present

## 2021-01-22 DIAGNOSIS — Z955 Presence of coronary angioplasty implant and graft: Secondary | ICD-10-CM | POA: Diagnosis not present

## 2021-01-22 DIAGNOSIS — Z951 Presence of aortocoronary bypass graft: Secondary | ICD-10-CM | POA: Diagnosis not present

## 2021-01-22 DIAGNOSIS — Z794 Long term (current) use of insulin: Secondary | ICD-10-CM | POA: Diagnosis not present

## 2021-01-22 DIAGNOSIS — I130 Hypertensive heart and chronic kidney disease with heart failure and stage 1 through stage 4 chronic kidney disease, or unspecified chronic kidney disease: Secondary | ICD-10-CM | POA: Diagnosis not present

## 2021-01-22 DIAGNOSIS — Z01818 Encounter for other preprocedural examination: Secondary | ICD-10-CM | POA: Diagnosis not present

## 2021-01-22 DIAGNOSIS — Z9049 Acquired absence of other specified parts of digestive tract: Secondary | ICD-10-CM | POA: Diagnosis not present

## 2021-01-22 DIAGNOSIS — I10 Essential (primary) hypertension: Secondary | ICD-10-CM | POA: Diagnosis not present

## 2021-01-22 DIAGNOSIS — N189 Chronic kidney disease, unspecified: Secondary | ICD-10-CM | POA: Diagnosis not present

## 2021-01-22 DIAGNOSIS — Z114 Encounter for screening for human immunodeficiency virus [HIV]: Secondary | ICD-10-CM | POA: Diagnosis not present

## 2021-01-22 DIAGNOSIS — N184 Chronic kidney disease, stage 4 (severe): Secondary | ICD-10-CM | POA: Diagnosis not present

## 2021-01-22 DIAGNOSIS — E1122 Type 2 diabetes mellitus with diabetic chronic kidney disease: Secondary | ICD-10-CM | POA: Diagnosis not present

## 2021-01-22 DIAGNOSIS — Z79899 Other long term (current) drug therapy: Secondary | ICD-10-CM | POA: Diagnosis not present

## 2021-01-23 ENCOUNTER — Ambulatory Visit (HOSPITAL_COMMUNITY): Payer: Federal, State, Local not specified - PPO

## 2021-01-26 ENCOUNTER — Encounter: Payer: Self-pay | Admitting: Orthopedic Surgery

## 2021-01-26 ENCOUNTER — Ambulatory Visit (HOSPITAL_COMMUNITY): Payer: Federal, State, Local not specified - PPO

## 2021-01-26 ENCOUNTER — Ambulatory Visit (INDEPENDENT_AMBULATORY_CARE_PROVIDER_SITE_OTHER): Payer: Federal, State, Local not specified - PPO | Admitting: Orthopedic Surgery

## 2021-01-26 ENCOUNTER — Telehealth (HOSPITAL_COMMUNITY): Payer: Self-pay

## 2021-01-26 ENCOUNTER — Other Ambulatory Visit: Payer: Self-pay

## 2021-01-26 ENCOUNTER — Ambulatory Visit: Payer: Federal, State, Local not specified - PPO

## 2021-01-26 VITALS — BP 132/76 | HR 95 | Ht 70.5 in | Wt 252.0 lb

## 2021-01-26 DIAGNOSIS — M25512 Pain in left shoulder: Secondary | ICD-10-CM | POA: Diagnosis not present

## 2021-01-26 DIAGNOSIS — I25810 Atherosclerosis of coronary artery bypass graft(s) without angina pectoris: Secondary | ICD-10-CM

## 2021-01-26 DIAGNOSIS — G8929 Other chronic pain: Secondary | ICD-10-CM | POA: Diagnosis not present

## 2021-01-26 NOTE — Progress Notes (Signed)
Chief Complaint  Patient presents with  . Shoulder Pain    Patient reports he had a fall 2 weeks ago in the snow.    [place] c/o left shoulder pain   60 yo fell during a recent snowstorm complains of left shoulder pain he is unclear if it happened before after he went to the ER so we had to rex-ray him.  He complains of weakness and pain in his left shoulder primarily anterior joint line anterolateral acromion and deltoid  Review of Systems  Constitutional: Negative for chills and fever.  Neurological: Negative for tingling.   Scheduled for liver transplant due to nonalcoholic cirrhosis  Physical Exam Constitutional:      General: He is not in acute distress.    Appearance: He is well-developed.     Comments: Well developed, well nourished Normal grooming and hygiene     Cardiovascular:     Comments: No peripheral edema Musculoskeletal:     Comments: Left shoulder weakness and manual muscle testing right shoulder 5 out of 5 supraspinatus  Passive and active range of motion otherwise normal but painful  Skin:    General: Skin is warm and dry.  Neurological:     Mental Status: He is alert and oriented to person, place, and time.     Sensory: No sensory deficit.     Coordination: Coordination normal.     Gait: Gait normal.     Deep Tendon Reflexes: Reflexes are normal and symmetric.  Psychiatric:        Mood and Affect: Mood normal.        Behavior: Behavior normal.        Thought Content: Thought content normal.        Judgment: Judgment normal.     Comments: Affect normal      Outside images: were read by me  xrays 3 views left shoulder glenohumeral arthritis is seen on the axillary view  Type II acromion high riding humeral head  ? Timing of fall so images repeated  No frx  Patient will get a cortisone shot for injection and sent home exercises  Follow-up as needed  Procedure note the subacromial injection shoulder left   Verbal consent was obtained to inject  the  Left   Shoulder  Timeout was completed to confirm the injection site is a subacromial space of the  left  shoulder  Medication used Depo-Medrol 40 mg and lidocaine 1% 3 cc  Anesthesia was provided by ethyl chloride  The injection was performed in the left  posterior subacromial space. After pinning the skin with alcohol and anesthetized the skin with ethyl chloride the subacromial space was injected using a 20-gauge needle. There were no complications  Sterile dressing was applied.   Procedure note the subacromial injection shoulder left   Verbal consent was obtained to inject the  Left   Shoulder  Timeout was completed to confirm the injection site is a subacromial space of the  left  shoulder  Medication usedcelestone and lidocaine 1% 3 cc  Anesthesia was provided by ethyl chloride  The injection was performed in the left  posterior subacromial space. After pinning the skin with alcohol and anesthetized the skin with ethyl chloride the subacromial space was injected using a 20-gauge needle. There were no complications  Sterile dressing was applied.  F/u prn

## 2021-01-26 NOTE — Telephone Encounter (Signed)
pt called to cx this appt due to he has a cough

## 2021-01-26 NOTE — Progress Notes (Signed)
xrayed patients left shoulder he has a glucose monitor in place during xrays I have told him to contact the company and replace device after xrays, told him when he calls let them know he had xrays and needs replacement. He has voiced understanding

## 2021-01-26 NOTE — Patient Instructions (Signed)

## 2021-01-28 ENCOUNTER — Ambulatory Visit (HOSPITAL_COMMUNITY): Payer: Federal, State, Local not specified - PPO

## 2021-02-02 ENCOUNTER — Other Ambulatory Visit (HOSPITAL_COMMUNITY)
Admission: RE | Admit: 2021-02-02 | Discharge: 2021-02-02 | Disposition: A | Discharge status: Home / Self Care | Payer: Federal, State, Local not specified - PPO | Source: Ambulatory Visit | Attending: Nephrology | Admitting: Nephrology

## 2021-02-02 DIAGNOSIS — D696 Thrombocytopenia, unspecified: Secondary | ICD-10-CM | POA: Insufficient documentation

## 2021-02-02 DIAGNOSIS — E876 Hypokalemia: Secondary | ICD-10-CM | POA: Diagnosis not present

## 2021-02-02 DIAGNOSIS — E871 Hypo-osmolality and hyponatremia: Secondary | ICD-10-CM | POA: Diagnosis not present

## 2021-02-02 DIAGNOSIS — N189 Chronic kidney disease, unspecified: Secondary | ICD-10-CM | POA: Diagnosis not present

## 2021-02-02 DIAGNOSIS — E875 Hyperkalemia: Secondary | ICD-10-CM | POA: Insufficient documentation

## 2021-02-02 DIAGNOSIS — K7460 Unspecified cirrhosis of liver: Secondary | ICD-10-CM | POA: Insufficient documentation

## 2021-02-02 DIAGNOSIS — E1122 Type 2 diabetes mellitus with diabetic chronic kidney disease: Secondary | ICD-10-CM | POA: Insufficient documentation

## 2021-02-02 LAB — RENAL FUNCTION PANEL
Albumin: 4 g/dL (ref 3.5–5.0)
Anion gap: 10 (ref 5–15)
BUN: 36 mg/dL — ABNORMAL HIGH (ref 8–23)
CO2: 23 mmol/L (ref 22–32)
Calcium: 9.9 mg/dL (ref 8.9–10.3)
Chloride: 97 mmol/L — ABNORMAL LOW (ref 98–111)
Creatinine, Ser: 1.66 mg/dL — ABNORMAL HIGH (ref 0.61–1.24)
GFR, Estimated: 47 mL/min — ABNORMAL LOW (ref >60)
Glucose, Bld: 253 mg/dL — ABNORMAL HIGH (ref 70–99)
Phosphorus: 3.7 mg/dL (ref 2.5–4.6)
Potassium: 4.7 mmol/L (ref 3.5–5.1)
Sodium: 130 mmol/L — ABNORMAL LOW (ref 135–145)

## 2021-02-02 LAB — CBC
HCT: 38.1 % — ABNORMAL LOW (ref 39.0–52.0)
Hemoglobin: 13 g/dL (ref 13.0–17.0)
MCH: 33.6 pg (ref 26.0–34.0)
MCHC: 34.1 g/dL (ref 30.0–36.0)
MCV: 98.4 fL (ref 80.0–100.0)
Platelets: 74 10*3/uL — ABNORMAL LOW (ref 150–400)
RBC: 3.87 MIL/uL — ABNORMAL LOW (ref 4.22–5.81)
RDW: 15.1 % (ref 11.5–15.5)
WBC: 5.9 10*3/uL (ref 4.0–10.5)
nRBC: 0 % (ref 0.0–0.2)

## 2021-02-02 LAB — CORTISOL: Cortisol, Plasma: 5.8 ug/dL

## 2021-02-02 LAB — TSH: TSH: 1.901 u[IU]/mL (ref 0.350–4.500)

## 2021-02-02 LAB — PROTEIN / CREATININE RATIO, URINE
Creatinine, Urine: 23.3 mg/dL
Total Protein, Urine: <6 mg/dL

## 2021-02-02 LAB — T4, FREE: Free T4: 0.8 ng/dL (ref 0.61–1.12)

## 2021-02-03 ENCOUNTER — Ambulatory Visit (HOSPITAL_COMMUNITY): Payer: Federal, State, Local not specified - PPO

## 2021-02-03 ENCOUNTER — Encounter (HOSPITAL_COMMUNITY): Payer: Self-pay

## 2021-02-03 ENCOUNTER — Other Ambulatory Visit: Payer: Self-pay

## 2021-02-03 DIAGNOSIS — R2681 Unsteadiness on feet: Secondary | ICD-10-CM | POA: Diagnosis not present

## 2021-02-03 DIAGNOSIS — M6281 Muscle weakness (generalized): Secondary | ICD-10-CM

## 2021-02-03 NOTE — Therapy (Addendum)
[facility] [address] [place], [place], [address] Phone: [phone]   Fax:  [phone]  Physical Therapy Treatment and Progress Note and Recertification  Patient Details  Name: [name] MRN: [id number] Date of Birth: 05-Mar-1961 Referring Provider (PT): Dr. [name]  Progress Note Reporting Period 12/24/20 to 02/03/21  See note below for Objective Data and Assessment of Progress/Goals.      Encounter Date: 02/03/2021   PT End of Session - 02/03/21 0931    Visit Number 6    Number of Visits 12    Date for PT Re-Evaluation 02/04/21    Authorization Type BCBS/Federal employee, no auth, no VL    PT Start Time 0930   Pt late to apt   PT Stop Time 1002    PT Time Calculation (min) 32 min    Activity Tolerance Patient tolerated treatment well;Patient limited by fatigue    Behavior During Therapy [facility] for tasks assessed/performed           Past Medical History:  Diagnosis Date  . Anemia   . Arthritis   . CAD (coronary artery disease)    Multivessel disease status post CABG 08/2015  . Cirrhosis ([place])   . CKD (chronic kidney disease) stage 3, GFR 30-59 ml/min (HCC)   . Diastolic CHF ([place])   . Essential hypertension   . Hyperlipidemia   . OSA on CPAP   . Polyclonal gammopathy   . Thrombocytopenia ([place]) 2016  . Type 2 diabetes mellitus ([place])     Past Surgical History:  Procedure Laterality Date  . APPENDECTOMY    . Biceps tendon surgery Right   . BIOPSY  11/22/2019   Procedure: BIOPSY;  Surgeon: Daneil Dolin, MD;  [facility];  Service: Endoscopy;;  . CARDIAC CATHETERIZATION N/A 08/25/2015   Procedure: Left Heart Cath and Coronary Angiography;  Surgeon: Belva Crome, MD;  Location: [place] CV LAB;  Service: Cardiovascular;  Laterality: N/A;  . COLONOSCOPY WITH PROPOFOL N/A 11/22/2019   Procedure: COLONOSCOPY WITH PROPOFOL;  Surgeon: Daneil Dolin, MD; Four 4-5 mm polyps, findings  suggestive of portal colopathy, congested appearing colonic mucosa diffusely, rectal varices, and adequate right colon prep.  Pathology with tubular adenomas and hyperplastic polyp.  Right colon biopsy with focal active colitis.  Recommendations to repeat colonoscopy in 3 months due to poor prep.  . COLONOSCOPY WITH PROPOFOL N/A 03/17/2020   Procedure: COLONOSCOPY WITH PROPOFOL;  Surgeon: Daneil Dolin, MD;  [facility];  Service: Endoscopy;  Laterality: N/A;  8:45am - pt does not need covid test, was + 2/4 <90 days  . CORONARY ARTERY BYPASS GRAFT N/A 08/29/2015   Procedure: CORONARY ARTERY BYPASS GRAFTING (CABG);  Surgeon: [name], MD;  Location: [facility];  Service: Open Heart Surgery;  Laterality: N/A;  . ESOPHAGOGASTRODUODENOSCOPY (EGD) WITH PROPOFOL N/A 11/22/2019   Procedure: ESOPHAGOGASTRODUODENOSCOPY (EGD) WITH PROPOFOL;  Surgeon: Daneil Dolin, MD; 4 columns of grade 2-3 esophageal varices, portal gastropathy, gastric polyp/abnormal gastric mucosa s/p biopsy.  Pathology with hyperplastic polyp, mild chronic gastritis, negative H. pylori.  [name] KNEE ARTHROSCOPY Left   . TEE WITHOUT CARDIOVERSION N/A 08/29/2015   Procedure: TRANSESOPHAGEAL ECHOCARDIOGRAM (TEE);  Surgeon: [name], MD;  Location: [facility];  Service: Open Heart Surgery;  Laterality: N/A;  . TOTAL KNEE ARTHROPLASTY Left 03/09/2017   Procedure: TOTAL KNEE ARTHROPLASTY;  Surgeon: Carole Civil, MD;  Location[facility];  Service: Orthopedics;  Laterality: Left;    There were no vitals filed for this visit.   Subjective Assessment - 02/03/21 0929    Subjective Pt reports he is feeling good today.  Reports she has had to cancel PT sessions due to apts at [facility] for transplant.  Reports shoulder feels better, has been given additional exercises and a shot.  Reports no falls since he fell in the rain and hurt shoulder.  REports he feels 75% improvements though feels he could continue wiht therapy to improve  balance and functional strengthening.    Currently in Pain? No/denies              [facility] PT Assessment - 02/03/21 0001      Ambulation/Gait   Ambulation/Gait Yes    Ambulation/Gait Assistance 5: Supervision    Ambulation Distance (Feet) 354 Feet   was 200   Assistive device None    Gait Pattern Decreased stance time - right;Decreased stride length    Ambulation Surface Level;Indoor    Gait velocity decreased    Stairs Yes    Stairs Assistance 6: Modified independent (Device/Increase time)    Stair Management Technique One rail Right;Two rails;Step to pattern;Alternating pattern    Number of Stairs 4    Height of Stairs 7    Gait Comments 2MWT                         [facility] Adult PT Treatment/Exercise - 02/03/21 0001      Knee/Hip Exercises: Standing   Step Down Right;10 reps;Hand Hold: 2;Step Height: 4"    Functional Squat 15 reps    Stairs 7in; difficulty with step downs weak Rt    SLS 3x Lt 14", Rt 16" max      Knee/Hip Exercises: Seated   Long Arc Quad 10 reps    Long Arc Quad Weight 4 lbs.    Sit to Sand 5 reps;without UE support   5STS 13.85"                   PT Short Term Goals - 02/03/21 0931      PT SHORT TERM GOAL #1   Title Patient will be independent with HEP in order to improve functional outcomes.    Baseline 02/03/21: reports compliance with HEP 4x a week.    Status Achieved      PT SHORT TERM GOAL #2   Title Patient will manifest low risk for falls per time of 12 sec 5 Times Sit to Stand Test    Baseline 02/03/21: 5 STS no HHA 13.85" standard chair height;  inital eval was 17 sec 20" seat height    Status On-going      PT SHORT TERM GOAL #3   Title Patient will demo improved ambulation as evidenced by distance of 300 ft during 2MWT without need for rest period    Baseline 02/03/21: 2MWT 373f initial eval was 200 ft with LOB and 4/10 RPE    Status Achieved             PT Long Term Goals - 02/03/21 0939      PT LONG  TERM GOAL #1   Title Patient will be able to navigate stairs with reciprocal pattern without compensation in order to demonstrate improved LE strength.    Baseline 02/03/21: difficulty with eccentric control descending Rt LE required BHR and tendency to step-to pattern; initial eval: BHR and non-reciprocal    Status On-going  PT LONG TERM GOAL #2   Title Patient will demonstrate improved ambulation tolerance as evidenced by distance of 350 ft during 2MWT    Baseline 02/03/21: 2MWT 353f initial eval was 200 ft with LOB and 4/10 RPE    Status Achieved                 Plan - 02/03/21 1113    Clinical Impression Statement Pt has missed 3 weeks due to coughing and apts in [facility] concerning liver transplant.  Reviewed goals per cert dates with the following findings:  Pt reports complaince wiht HEP 3-4 times a week, able to recall and demonstrate appropriate mechanics.  Improved time wiht 5STS though has not met this goal yet.  Increased cadence noted with 2MWT and ability to ambulate 3569fwith no AD and no LOB episodes.  Pt demonstrates Rt quad weakness noted with ascending and descending stairs and difficulty with eccentric control.  Decreased confidence with SLS activities.  Pt will continue to benefit from therapy to address these deficits for fall prevention and functional strengthening.    Personal Factors and Comorbidities Age;Comorbidity 1;Time since onset of injury/illness/exacerbation    Comorbidities PMH, liver    Examination-Activity Limitations Bed Mobility;Bend;Carry;Lift;Stand;Stairs;Squat;Locomotion Level;Transfers    Examination-Participation Restrictions Community Activity;Yard Work;Meal Prep    Stability/Clinical Decision Making Stable/Uncomplicated    Clinical Decision Making Low    Rehab Potential Good    PT Frequency 2x / week    PT Duration 6 weeks    PT Treatment/Interventions ADLs/Self Care Home Management;Aquatic Therapy;Electrical Stimulation;DME Instruction;Gait  training;Stair training;Functional mobility training;Therapeutic activities;Therapeutic exercise;Balance training;Patient/family education;Neuromuscular re-education;Manual techniques;Dry needling;Energy conservation;Spinal Manipulations;Joint Manipulations    PT Next Visit Plan Continue to improve LE strength and activity tolerance.  Next session progress quad strength wtih stairs, balance activities (add toe tapping) and add machines (educate proper weight and mechanics) for beginning [employer].    PT Home Exercise Plan SLR, hip add, 1/18: bridge, STS; 1/27: heel/toe raise and sidelying abd; 2/2: tandem stance and SLS by counter           Patient will benefit from skilled therapeutic intervention in order to improve the following deficits and impairments:  Abnormal gait,Decreased activity tolerance,Decreased balance,Decreased mobility,Decreased knowledge of use of DME,Decreased endurance,Decreased strength,Difficulty walking,Impaired perceived functional ability,Improper body mechanics,Obesity  Visit Diagnosis: Muscle weakness (generalized)  Unsteadiness on feet     Problem List Patient Active Problem List   Diagnosis Date Noted  . Hypertensive heart and kidney disease with heart failure and with chronic kidney disease stage III (HCLewistown Heights01/04/2021  . Chronic diastolic heart failure (HCHaslett01/04/2021  . Type 2 diabetes mellitus with stage 3b chronic kidney disease and hypertension (HCMahaska01/04/2021  . CKD stage 3 due to type 2 diabetes mellitus (HCHarmon01/04/2021  . Hyperlipidemia associated with type 2 diabetes mellitus (HCYalobusha01/04/2021  . Anxiety 12/17/2020  . Chronic non-seasonal allergic rhinitis 12/17/2020  . Hypotension 12/10/2020  . Acute renal failure superimposed on stage 3b chronic kidney disease (HCSouth Laurel12/29/2021  . Acute renal failure superimposed on stage 3b chronic kidney disease, unspecified acute renal failure type (HCBrookmont12/29/2021  . AKI (acute kidney injury) (HCAddyston 12/09/2020  . GERD (gastroesophageal reflux disease) 10/09/2020  . Anasarca   . Cirrhosis of liver (HCGrand Haven  . Acute on chronic heart failure with preserved ejection fraction (HCCatheys Valley  . Liver failure (HCGladwin07/08/2020  . Esophageal varices (HCWetmore01/27/2021  . History of adenomatous polyp of colon 01/09/2020  . Elevated AST (SGOT)  08/23/2019  . Diarrhea 07/17/2019  . Hypersomnia, persistent 03/12/2019  . Macrocytic anemia 01/18/2019  . Other pancytopenia ([place]) 01/18/2019  . Thrombocytopenia ([place]) 12/30/2018  . Polyclonal gammopathy 12/30/2018  . S/P total knee replacement, left 03/09/17 11/22/2017  . Primary osteoarthritis of left knee 03/09/2017  . History of nocturia 03/02/2017  . OSA on CPAP 03/02/2016  . Hypoxia, sleep related 03/02/2016  . Cyst of mediastinum 01/15/2016  . S/P CABG x 4 08/29/2015  . CAD (coronary artery disease)   . Obesity, Class III, BMI 40-49.9 (morbid obesity) ([place])   . Hyperglycemia   . Pain in the chest 08/23/2015  . Non-ST elevation MI (NSTEMI) ([place]) 08/23/2015  . Diabetes mellitus type 2, uncontrolled ([place]) 08/23/2015  . Hypertension 08/23/2015  . Hyperlipidemia 08/23/2015  . Type 2 diabetes mellitus with hyperglycemia ([place]) 2016  . Class 2 obesity 2016  . Hyponatremia 2016  . Osteoarthritis, knee 05/24/2012  . Knee pain 10/29/2011  . Knee stiffness 10/29/2011  . S/P right knee arthroscopy 10/26/2011  . Medial meniscus, posterior horn derangement 09/07/2011  . Lateral meniscus derangement 09/07/2011  . OA (osteoarthritis) of knee 09/07/2011  . Rotator cuff syndrome of left shoulder 08/19/2011  . Right knee meniscal tear 08/19/2011   Ihor Austin, LPTA/CLT; CBIS [phone]  Aldona Lento 02/03/2021, 11:23 AM  3:35 PM, 02/03/21  M. Sherlyn Lees, PT, DPT Physical Therapist- [place] Office Number: [phone]   [facility] [address] [place], [place], [address] Phone: [phone]    Fax:  [phone]  Name: LUCIEN BUDNEY MRN: [id number] Date of Birth: 26-Aug-1961

## 2021-02-03 NOTE — Addendum Note (Signed)
Addended by: Toniann Fail on: 02/03/2021 03:38 PM   Modules accepted: Orders

## 2021-02-04 LAB — PROTEIN ELECTROPHORESIS, SERUM
A/G Ratio: 1.2 (ref 0.7–1.7)
Albumin ELP: 3.9 g/dL (ref 2.9–4.4)
Alpha-1-Globulin: 0.2 g/dL (ref 0.0–0.4)
Alpha-2-Globulin: 0.7 g/dL (ref 0.4–1.0)
Beta Globulin: 1.3 g/dL (ref 0.7–1.3)
Gamma Globulin: 1 g/dL (ref 0.4–1.8)
Globulin, Total: 3.2 g/dL (ref 2.2–3.9)
Total Protein ELP: 7.1 g/dL (ref 6.0–8.5)

## 2021-02-05 ENCOUNTER — Ambulatory Visit (HOSPITAL_COMMUNITY): Payer: Federal, State, Local not specified - PPO

## 2021-02-05 ENCOUNTER — Other Ambulatory Visit: Payer: Self-pay

## 2021-02-05 ENCOUNTER — Encounter (HOSPITAL_COMMUNITY): Payer: Self-pay

## 2021-02-05 DIAGNOSIS — M6281 Muscle weakness (generalized): Secondary | ICD-10-CM | POA: Diagnosis not present

## 2021-02-05 DIAGNOSIS — E871 Hypo-osmolality and hyponatremia: Secondary | ICD-10-CM | POA: Diagnosis not present

## 2021-02-05 DIAGNOSIS — K7460 Unspecified cirrhosis of liver: Secondary | ICD-10-CM | POA: Diagnosis not present

## 2021-02-05 DIAGNOSIS — E1122 Type 2 diabetes mellitus with diabetic chronic kidney disease: Secondary | ICD-10-CM | POA: Diagnosis not present

## 2021-02-05 DIAGNOSIS — I129 Hypertensive chronic kidney disease with stage 1 through stage 4 chronic kidney disease, or unspecified chronic kidney disease: Secondary | ICD-10-CM | POA: Diagnosis not present

## 2021-02-05 DIAGNOSIS — D472 Monoclonal gammopathy: Secondary | ICD-10-CM | POA: Diagnosis not present

## 2021-02-05 DIAGNOSIS — R891 Abnormal level of hormones in specimens from other organs, systems and tissues: Secondary | ICD-10-CM | POA: Diagnosis not present

## 2021-02-05 DIAGNOSIS — R2681 Unsteadiness on feet: Secondary | ICD-10-CM

## 2021-02-05 DIAGNOSIS — N189 Chronic kidney disease, unspecified: Secondary | ICD-10-CM | POA: Diagnosis not present

## 2021-02-05 NOTE — Therapy (Signed)
[facility] [place], [place], [address] Phone: [phone]   Fax:  [phone]  Physical Therapy Treatment  Patient Details  Name: [name] MRN: [id number] Date of Birth: [birth year] Referring Provider (PT): Dr. [name]   Encounter Date: 02/05/2021   PT End of Session - 02/05/21 0932    Visit Number 7    Number of Visits 12    Date for PT Re-Evaluation 03/03/21    Authorization Type BCBS/Federal employee, no auth, no VL    Progress Note Due on Visit 16    PT Start Time 0929   late for apt   PT Stop Time 1005    PT Time Calculation (min) 36 min    Equipment Utilized During Treatment Gait belt    Activity Tolerance Patient tolerated treatment well;Patient limited by fatigue    Behavior During Therapy [facility] for tasks assessed/performed           Past Medical History:  Diagnosis Date  . Anemia   . Arthritis   . CAD (coronary artery disease)    Multivessel disease status post CABG 08/2015  . Cirrhosis ([place])   . CKD (chronic kidney disease) stage 3, GFR 30-59 ml/min (HCC)   . Diastolic CHF ([place])   . Essential hypertension   . Hyperlipidemia   . OSA on CPAP   . Polyclonal gammopathy   . Thrombocytopenia ([place]) 2016  . Type 2 diabetes mellitus ([place])     Past Surgical History:  Procedure Laterality Date  . APPENDECTOMY    . Biceps tendon surgery Right   . BIOPSY  11/22/2019   Procedure: BIOPSY;  Surgeon: Daneil Dolin, MD;  [facility];  Service: Endoscopy;;  . CARDIAC CATHETERIZATION N/A 08/25/2015   Procedure: Left Heart Cath and Coronary Angiography;  Surgeon: Belva Crome, MD;  Location: [facility] CV LAB;  Service: Cardiovascular;  Laterality: N/A;  . COLONOSCOPY WITH PROPOFOL N/A 11/22/2019   Procedure: COLONOSCOPY WITH PROPOFOL;  Surgeon: Daneil Dolin, MD; Four 4-5 mm polyps, findings suggestive of portal colopathy, congested appearing colonic mucosa diffusely, rectal varices,  and adequate right colon prep.  Pathology with tubular adenomas and hyperplastic polyp.  Right colon biopsy with focal active colitis.  Recommendations to repeat colonoscopy in 3 months due to poor prep.  . COLONOSCOPY WITH PROPOFOL N/A 03/17/2020   Procedure: COLONOSCOPY WITH PROPOFOL;  Surgeon: Daneil Dolin, MD;  [facility];  Service: Endoscopy;  Laterality: N/A;  8:45am - pt does not need covid test, was + 2/4 <90 days  . CORONARY ARTERY BYPASS GRAFT N/A 08/29/2015   Procedure: CORONARY ARTERY BYPASS GRAFTING (CABG);  Surgeon: [name], MD;  Location: [facility];  Service: Open Heart Surgery;  Laterality: N/A;  . ESOPHAGOGASTRODUODENOSCOPY (EGD) WITH PROPOFOL N/A 11/22/2019   Procedure: ESOPHAGOGASTRODUODENOSCOPY (EGD) WITH PROPOFOL;  Surgeon: Daneil Dolin, MD; 4 columns of grade 2-3 esophageal varices, portal gastropathy, gastric polyp/abnormal gastric mucosa s/p biopsy.  Pathology with hyperplastic polyp, mild chronic gastritis, negative H. pylori.  [name] KNEE ARTHROSCOPY Left   . TEE WITHOUT CARDIOVERSION N/A 08/29/2015   Procedure: TRANSESOPHAGEAL ECHOCARDIOGRAM (TEE);  Surgeon: [name], MD;  Location: [facility];  Service: Open Heart Surgery;  Laterality: N/A;  . TOTAL KNEE ARTHROPLASTY Left 03/09/2017   Procedure: TOTAL KNEE ARTHROPLASTY;  Surgeon: Carole Civil, MD;  [facility];  Service: Orthopedics;  Laterality: Left;    There were no vitals filed  for this visit.   Subjective Assessment - 02/05/21 0930    Subjective Pt reports a fall in the bathroom Tuesday night, stated foot got under rug in bathroom and he landed on toilet.  Increased Lt shoulder pain from hitting it against the wall.    Currently in Pain? Yes    Pain Score 7     Pain Location Shoulder    Pain Orientation Left    Pain Descriptors / Indicators Aching;Sore    Pain Type Acute pain    Pain Onset In the past 7 days    Pain Frequency Intermittent    Aggravating Factors  cold  weather    Pain Relieving Factors elevation                             [facility] Adult PT Treatment/Exercise - 02/05/21 0001      Knee/Hip Exercises: Aerobic   Nustep 5' UE/LE only L2      Knee/Hip Exercises: Standing   Heel Raises 2 sets;10 reps    Heel Raises Limitations toe raises with UE Support    Step Down Both;10 reps;Hand Hold: 2;Step Height: 4"    Functional Squat 15 reps    SLS 3x Lt 4", Rt 3"    SLS with Vectors 3x 5" with UE support      Knee/Hip Exercises: Seated   Sit to Sand 10 reps;without UE support               Balance Exercises - 02/05/21 0001      Balance Exercises: Standing   Tandem Stance Eyes open;2 reps;30 secs;Foam/compliant surface;Intermittent upper extremity support    Retro Gait 2 reps    Sidestepping 2 reps               PT Short Term Goals - 02/03/21 0931      PT SHORT TERM GOAL #1   Title Patient will be independent with HEP in order to improve functional outcomes.    Baseline 02/03/21: reports compliance with HEP 4x a week.    Status Achieved      PT SHORT TERM GOAL #2   Title Patient will manifest low risk for falls per time of 12 sec 5 Times Sit to Stand Test    Baseline 02/03/21: 5 STS no HHA 13.85" standard chair height;  inital eval was 17 sec 20" seat height    Status On-going      PT SHORT TERM GOAL #3   Title Patient will demo improved ambulation as evidenced by distance of 300 ft during 2MWT without need for rest period    Baseline 02/03/21: 2MWT 366ft initial eval was 200 ft with LOB and 4/10 RPE    Status Achieved             PT Long Term Goals - 02/03/21 0939      PT LONG TERM GOAL #1   Title Patient will be able to navigate stairs with reciprocal pattern without compensation in order to demonstrate improved LE strength.    Baseline 02/03/21: difficulty with eccentric control descending Rt LE required BHR and tendency to step-to pattern; initial eval: BHR and non-reciprocal    Status On-going       PT LONG TERM GOAL #2   Title Patient will demonstrate improved ambulation tolerance as evidenced by distance of 350 ft during 2MWT    Baseline 02/03/21: 2MWT 360ft initial eval was 200 ft with LOB  and 4/10 RPE    Status Achieved                 Plan - 02/05/21 0947    Clinical Impression Statement Session focus with functional strengthening and balance training.  Discussed fall prevention strategies in home included removal of rugs and clutter, good lighting at night, and wear supportive shoes.  Cueing for posture during balalnce activities.  Therex focus on quad and gluteal strengthening.  Added vector stance for hip strengthening and SLS.  EOS nustep for activity tolerance.  Reports decreased pain at EOS.    Personal Factors and Comorbidities Age;Comorbidity 1;Time since onset of injury/illness/exacerbation    Comorbidities PMH, liver    Examination-Activity Limitations Bed Mobility;Bend;Carry;Lift;Stand;Stairs;Squat;Locomotion Level;Transfers    Examination-Participation Restrictions Community Activity;Yard Work;Meal Prep    Stability/Clinical Decision Making Stable/Uncomplicated    Rehab Potential Good    PT Frequency 2x / week    PT Duration 6 weeks    PT Treatment/Interventions ADLs/Self Care Home Management;Aquatic Therapy;Electrical Stimulation;DME Instruction;Gait training;Stair training;Functional mobility training;Therapeutic activities;Therapeutic exercise;Balance training;Patient/family education;Neuromuscular re-education;Manual techniques;Dry needling;Energy conservation;Spinal Manipulations;Joint Manipulations    PT Next Visit Plan Continue to improve LE strength and activity tolerance.  Next session progress quad strength wtih stairs, balance activities (add toe tapping) and add machines (educate proper weight and mechanics) for beginning [employer].    PT Home Exercise Plan SLR, hip add, 1/18: bridge, STS; 1/27: heel/toe raise and sidelying abd; 2/2: tandem  stance and SLS by counter           Patient will benefit from skilled therapeutic intervention in order to improve the following deficits and impairments:  Abnormal gait,Decreased activity tolerance,Decreased balance,Decreased mobility,Decreased knowledge of use of DME,Decreased endurance,Decreased strength,Difficulty walking,Impaired perceived functional ability,Improper body mechanics,Obesity  Visit Diagnosis: Muscle weakness (generalized)  Unsteadiness on feet     Problem List Patient Active Problem List   Diagnosis Date Noted  . Hypertensive heart and kidney disease with heart failure and with chronic kidney disease stage III ([place]) 12/17/2020  . Chronic diastolic heart failure ([place]) 12/17/2020  . Type 2 diabetes mellitus with stage 3b chronic kidney disease and hypertension ([place]) 12/17/2020  . CKD stage 3 due to type 2 diabetes mellitus ([place]) 12/17/2020  . Hyperlipidemia associated with type 2 diabetes mellitus ([place]) 12/17/2020  . Anxiety 12/17/2020  . Chronic non-seasonal allergic rhinitis 12/17/2020  . Hypotension 12/10/2020  . Acute renal failure superimposed on stage 3b chronic kidney disease ([place]) 12/10/2020  . Acute renal failure superimposed on stage 3b chronic kidney disease, unspecified acute renal failure type ([place]) 12/10/2020  . AKI (acute kidney injury) ([place]) 12/09/2020  . GERD (gastroesophageal reflux disease) 10/09/2020  . Anasarca   . Cirrhosis of liver ([place])   . Acute on chronic heart failure with preserved ejection fraction ([place])   . Liver failure ([place]) 06/20/2020  . Esophageal varices ([place]) 01/09/2020  . History of adenomatous polyp of colon 01/09/2020  . Elevated AST (SGOT) 08/23/2019  . Diarrhea 07/17/2019  . Hypersomnia, persistent 03/12/2019  . Macrocytic anemia 01/18/2019  . Other pancytopenia ([place]) 01/18/2019  . Thrombocytopenia ([place]) 12/30/2018  . Polyclonal gammopathy 12/30/2018  . S/P total knee replacement, left 03/09/17 11/22/2017  .  Primary osteoarthritis of left knee 03/09/2017  . History of nocturia 03/02/2017  . OSA on CPAP 03/02/2016  . Hypoxia, sleep related 03/02/2016  . Cyst of mediastinum 01/15/2016  . S/P CABG x 4 08/29/2015  . CAD (coronary artery disease)   . Obesity, Class III, BMI  40-49.9 (morbid obesity) ([place])   . Hyperglycemia   . Pain in the chest 08/23/2015  . Non-ST elevation MI (NSTEMI) ([place]) 08/23/2015  . Diabetes mellitus type 2, uncontrolled ([place]) 08/23/2015  . Hypertension 08/23/2015  . Hyperlipidemia 08/23/2015  . Type 2 diabetes mellitus with hyperglycemia ([place]) 2016  . Class 2 obesity 2016  . Hyponatremia 2016  . Osteoarthritis, knee 05/24/2012  . Knee pain 10/29/2011  . Knee stiffness 10/29/2011  . S/P right knee arthroscopy 10/26/2011  . Medial meniscus, posterior horn derangement 09/07/2011  . Lateral meniscus derangement 09/07/2011  . OA (osteoarthritis) of knee 09/07/2011  . Rotator cuff syndrome of left shoulder 08/19/2011  . Right knee meniscal tear 08/19/2011   Ihor Austin, LPTA/CLT; CBIS [phone]  Aldona Lento 02/05/2021, 10:07 AM  [facility] [place], [place], [address] Phone: [phone]   Fax:  [phone]  Name: JAKIM DRAPEAU MRN: [id number] Date of Birth: 11-18-1961

## 2021-02-06 ENCOUNTER — Ambulatory Visit: Payer: Federal, State, Local not specified - PPO | Admitting: Student

## 2021-02-06 NOTE — Progress Notes (Deleted)
Cardiology Office Note    Date:  02/06/2021   ID:  Christopher, [name] September 16, 1961, MRN [id number]  PCP:  Christopher Sites, MD  Cardiologist: Christopher Lesches, MD    No chief complaint on file.   History of Present Illness:    [name] is a 60 y.o. male with past medical history of CAD (s/p CABG in 2016), chronic diastolic CHF, cirrhosis, HTN, HLD, Type 2 DM, Stage 3 CKD, OSA, thrombocytopenia and polyclonal gammopathy  who presents to the office today for 4-week follow-up.  He was last examined by Dr. Domenic [name] on 01/01/2021 and had recently been hospitalized earlier that month for hypotension and an AKI and was temporarily taken off all diuretics and Coreg.  At the time of his visit, his weight had increased by approximately 15 pounds since hospital discharge (at 261 lbs during his office visit) and he was taking Spironolactone 50 mg daily and Torsemide 60 mg daily.  Torsemide was increased to 80 mg daily with plans for a follow-up BMET in 7-10 days. Repeat labs showed creatinine had increased from 1.50 to 2.42 and Torsemide had been reduced to 20mg  daily by GI/Transplant Team at [facility]. Repeat labs on 2/21 showed creatinine had improved to 1.66.    Past Medical History:  Diagnosis Date  . Anemia   . Arthritis   . CAD (coronary artery disease)    Multivessel disease status post CABG 08/2015  . Cirrhosis ([place])   . CKD (chronic kidney disease) stage 3, GFR 30-59 ml/min (HCC)   . Diastolic CHF ([place])   . Essential hypertension   . Hyperlipidemia   . OSA on CPAP   . Polyclonal gammopathy   . Thrombocytopenia ([place]) 2016  . Type 2 diabetes mellitus ([place])     Past Surgical History:  Procedure Laterality Date  . APPENDECTOMY    . Biceps tendon surgery Right   . BIOPSY  11/22/2019   Procedure: BIOPSY;  Surgeon: Christopher Dolin, MD;  [facility];  Service: Endoscopy;;  . CARDIAC CATHETERIZATION N/A 08/25/2015   Procedure: Left Heart Cath and Coronary  Angiography;  Surgeon: Christopher Crome, MD;  Location: [facility];  Service: Cardiovascular;  Laterality: N/A;  . COLONOSCOPY WITH PROPOFOL N/A 11/22/2019   Procedure: COLONOSCOPY WITH PROPOFOL;  Surgeon: Christopher Dolin, MD; Four 4-5 mm polyps, findings suggestive of portal colopathy, congested appearing colonic mucosa diffusely, rectal varices, and adequate right colon prep.  Pathology with tubular adenomas and hyperplastic polyp.  Right colon biopsy with focal active colitis.  Recommendations to repeat colonoscopy in 3 months due to poor prep.  . COLONOSCOPY WITH PROPOFOL N/A 03/17/2020   Procedure: COLONOSCOPY WITH PROPOFOL;  Surgeon: Christopher Dolin, MD;  [facility];  Service: Endoscopy;  Laterality: N/A;  8:45am - pt does not need covid test, was + 2/4 <90 days  . CORONARY ARTERY BYPASS GRAFT N/A 08/29/2015   Procedure: CORONARY ARTERY BYPASS GRAFTING (CABG);  Surgeon: Christopher Nakayama, MD;  Location: [place];  Service: Open Heart Surgery;  Laterality: N/A;  . ESOPHAGOGASTRODUODENOSCOPY (EGD) WITH PROPOFOL N/A 11/22/2019   Procedure: ESOPHAGOGASTRODUODENOSCOPY (EGD) WITH PROPOFOL;  Surgeon: Christopher Dolin, MD; 4 columns of grade 2-3 esophageal varices, portal gastropathy, gastric polyp/abnormal gastric mucosa s/p biopsy.  Pathology with hyperplastic polyp, mild chronic gastritis, negative H. pylori.  [name] KNEE ARTHROSCOPY Left   . TEE WITHOUT CARDIOVERSION N/A 08/29/2015   Procedure: TRANSESOPHAGEAL ECHOCARDIOGRAM (TEE);  Surgeon: Christopher Nakayama, MD;  [facility];  Service: Open Heart Surgery;  Laterality: N/A;  . TOTAL KNEE ARTHROPLASTY Left 03/09/2017   Procedure: TOTAL KNEE ARTHROPLASTY;  Surgeon: Christopher Civil, MD;  Location: [facility];  Service: Orthopedics;  Laterality: Left;    Current Medications: Outpatient Medications Prior to Visit  Medication Sig Dispense Refill  . acetaminophen (TYLENOL) 325 MG tablet Take 650 mg by mouth every 6 (six) hours as needed.     [name] albuterol (VENTOLIN HFA) 108 (90 Base) MCG/ACT inhaler Inhale 2 puffs into the lungs every 6 (six) hours as needed for wheezing or shortness of breath. 18 g 0  . ALPRAZolam (XANAX) 0.5 MG tablet Take 1 tablet (0.5 mg total) by mouth daily as needed. 10 tablet 0  . Armodafinil 200 MG TABS Take 200 mg by mouth daily as needed (for sleepiness). 30 tablet 0  . aspirin EC 81 MG tablet Take 81 mg by mouth at bedtime.     . Cholecalciferol (DIALYVITE VITAMIN D 5000) 125 MCG (5000 UT) capsule Take 5,000 Units by mouth in the morning.     . Continuous Blood Gluc Receiver (FREESTYLE LIBRE 14 DAY READER) DEVI 1 reader 10 each 0  . Continuous Blood Gluc Sensor (FREESTYLE LIBRE 14 DAY SENSOR) MISC 1 sensor    . Elastic Bandages & Supports (MEDICAL COMPRESSION STOCKINGS) MISC 1 each by Does not apply route as directed. Low pressure knee high compression stockings Dx: leg swelling 1 each 0  . ferrous sulfate 325 (65 FE) MG tablet Take 325 mg by mouth in the morning.     . Flaxseed, Linseed, (FLAXSEED OIL) 1000 MG CAPS Take 1,000 mg by mouth at bedtime.    . INS SYRINGE/NEEDLE .5CC/28G 28G X 1/2" 0.5 ML MISC Inject 1 Syringe into the skin See admin instructions. 100 each 0  . insulin aspart (NOVOLOG) 100 UNIT/ML injection Inject 5 Units into the skin 3 (three) times daily before meals. Special Instructions: If accu-check is greater than 150. Hold for accu-check 150 or below. With Meals 10 mL 0  . insulin degludec (TRESIBA FLEXTOUCH) 200 UNIT/ML FlexTouch Pen Inject 40 Units into the skin in the morning. Give 30 units Subcutaneous  at Bedtime 8:00 pm 15 mL 0  . lactulose (CHRONULAC) 10 GM/15ML solution Take 30 mLs (20 g total) by mouth daily. 900 mL 0  . loratadine (CLARITIN) 10 MG tablet Take 10 mg by mouth in the morning.     . Magnesium 250 MG TABS Take 250 mg by mouth daily.    . Misc Natural Products (ELDERBERRY ZINC/VIT C/IMMUNE) LOZG Take 1 lozenge by mouth at bedtime.    . NON FORMULARY Diet: _____  Regular, __x____ NAS, __x_____Consistent Carbohydrate, _______NPO _____Other    . omeprazole (PRILOSEC) 40 MG capsule Take 1 capsule (40 mg total) by mouth at bedtime. 30 capsule 0  . OZEMPIC, 1 MG/DOSE, 4 MG/3ML SOPN Inject 1 mg into the skin once a week. 3 mL 0  . potassium chloride SA (KLOR-CON) 20 MEQ tablet Take 20 mEq by mouth 2 (two) times daily.    . pravastatin (PRAVACHOL) 20 MG tablet TAKE 1 TABLET(20 MG) BY MOUTH AT BEDTIME 30 tablet 0  . spironolactone (ALDACTONE) 100 MG tablet Take 0.5 tablets (50 mg total) by mouth daily. 15 tablet 0  . torsemide (DEMADEX) 20 MG tablet Take 1 tablet (20 mg total) by mouth daily. 90 tablet 3  . TURMERIC CURCUMIN PO Take 2,000 mg by mouth at bedtime.  No facility-administered medications prior to visit.     Allergies:   Fluticasone   Social History   Socioeconomic History  . Marital status: Married    Spouse name: Not on file  . Number of children: 0  . Years of education: college  . Highest education level: Not on file  Occupational History  . Occupation: Maintenance tech    Employer: [employer]  Tobacco Use  . Smoking status: Never Smoker  . Smokeless tobacco: Never Used  Vaping Use  . Vaping Use: Never used  Substance and Sexual Activity  . Alcohol use: No    Alcohol/week: 0.0 standard drinks  . Drug use: No  . Sexual activity: Not Currently  Other Topics Concern  . Not on file  Social History Narrative  . Not on file   Social Determinants of Health   Financial Resource Strain: Not on file  Food Insecurity: Not on file  Transportation Needs: Not on file  Physical Activity: Not on file  Stress: Not on file  Social Connections: Not on file     Family History:  The patient's ***family history includes ALS in his mother; Aneurysm in his maternal grandmother; Arthritis in an other family member; CAD in his brother and father; Cancer in his maternal grandfather and another family member; Diabetes in an other  family member; Diabetes Mellitus II in his brother, brother, father, maternal grandmother, and sister; Hodgkin's lymphoma in his father; Liver cancer in his father.   Review of Systems:   Please see the history of present illness.     General:  No chills, fever, night sweats or weight changes.  Cardiovascular:  No chest pain, dyspnea on exertion, edema, orthopnea, palpitations, paroxysmal nocturnal dyspnea. Dermatological: No rash, lesions/masses Respiratory: No cough, dyspnea Urologic: No hematuria, dysuria Abdominal:   No nausea, vomiting, diarrhea, bright red blood per rectum, melena, or hematemesis Neurologic:  No visual changes, wkns, changes in mental status. All other systems reviewed and are otherwise negative except as noted above.   Physical Exam:    VS:  There were no vitals taken for this visit.   General: Well developed, well nourished,male appearing in no acute distress. Head: Normocephalic, atraumatic. Neck: No carotid bruits. JVD not elevated.  Lungs: Respirations regular and unlabored, without wheezes or rales.  Heart: ***Regular rate and rhythm. No S3 or S4.  No murmur, no rubs, or gallops appreciated. Abdomen: Appears non-distended. No obvious abdominal masses. Msk:  Strength and tone appear normal for age. No obvious joint deformities or effusions. Extremities: No clubbing or cyanosis. No edema.  Distal pedal pulses are 2+ bilaterally. Neuro: Alert and oriented X 3. Moves all extremities spontaneously. No focal deficits noted. Psych:  Responds to questions appropriately with a normal affect. Skin: No rashes or lesions noted  Wt Readings from Last 3 Encounters:  01/26/21 252 lb (114.3 kg)  01/09/21 256 lb (116.1 kg)  01/01/21 261 lb 12.8 oz (118.8 kg)        Studies/Labs Reviewed:   EKG:  EKG is*** ordered today.  The ekg ordered today demonstrates ***  Recent Labs: 06/20/2020: B Natriuretic Peptide 601.0 12/14/2020: Magnesium 1.7 12/19/2020: ALT  13 02/02/2021: BUN 36; Creatinine, Ser 1.66; Hemoglobin 13.0; Platelets 74; Potassium 4.7; Sodium 130; TSH 1.901   Lipid Panel    Component Value Date/Time   CHOL 120 05/25/2019 1001   TRIG 136 05/25/2019 1001   HDL 38 (L) 05/25/2019 1001   CHOLHDL 3.2 05/25/2019 1001   VLDL 27 05/25/2019  [place] 05/25/2019 1001    Additional studies/ records that were reviewed today include:   Cardiac Catheterization: 08/2015 1. Mid RCA to Dist RCA lesion, 100% stenosed. 2. 1st Mrg lesion, 90% stenosed. 3. Ramus lesion, 70% stenosed. 4. Ost Cx to Dist Cx lesion, 60% stenosed. 5. Ost 1st Diag lesion, 90% stenosed. 6. Ost 2nd Diag to 2nd Diag lesion, 70% stenosed. 7. Mid LAD lesion, 90% stenosed. 8. Post Atrio lesion, 90% stenosed. 9. Dist RCA lesion, 100% stenosed.    Severe native vessel coronary artery disease with total occlusion of the distal RCA (likely culprit for acute presentation), high-grade obstruction in the proximal to mid LAD, high-grade obstruction in the first obtuse marginal, significant stenosis in the ramus intermedius, first diagonal, and second diagonal.  LVEF 50% with marked elevation in LVEDP, consistent with acute diastolic left heart failure.  Distal right coronary is collateralized from the LAD and circumflex. The right coronary is dominant.   RECOMMENDATIONS:   TCTS consultation for consideration of surgical revascularization.  Continue aspirin. No Plavix. In TIMI IV heparin.    Echocardiogram: 05/2020 IMPRESSIONS    1. Left ventricular ejection fraction, by estimation, is 60 to 65%. The  left ventricle has normal function. The left ventricle has no regional  wall motion abnormalities. The left ventricular internal cavity size was  mildly dilated. There is mild left  ventricular hypertrophy. Left ventricular diastolic parameters are  indeterminate.  2. Right ventricular systolic function is normal. The right ventricular  size is normal.  There is normal pulmonary artery systolic pressure.  3. The mitral valve is abnormal. Mild mitral valve regurgitation.  4. AV is thickened, calcifieid Difficult to see well. Peak and mean  gradients through the valve are 31 and 18 mm Hg respectively consistent  with mild AS. Compared to echo report from 2019, gradients are increased.  . The aortic valve is abnormal. Aortic  valve regurgitation is not visualized. Mild aortic valve stenosis.  5. The inferior vena cava is dilated in size with <50% respiratory  variability, suggesting right atrial pressure of 15 mmHg.   Assessment:    No diagnosis found.   Plan:   In order of problems listed above:  1. ***    Shared Decision Making/Informed Consent:   {Are you ordering a CV Procedure (e.g. stress test, cath, DCCV, TEE, etc)?   Press F2        :[id number]}    Medication Adjustments/Labs and Tests Ordered: Current medicines are reviewed at length with the patient today.  Concerns regarding medicines are outlined above.  Medication changes, Labs and Tests ordered today are listed in the Patient Instructions below. There are no Patient Instructions on file for this visit.   Signed, Erma Heritage, PA-C  02/06/2021 7:35 AM    [facility] [address] [place],  [address] Phone: [phone] Fax: [phone]

## 2021-02-10 ENCOUNTER — Telehealth (HOSPITAL_COMMUNITY): Payer: Self-pay

## 2021-02-10 ENCOUNTER — Ambulatory Visit (HOSPITAL_COMMUNITY): Payer: Federal, State, Local not specified - PPO

## 2021-02-10 NOTE — Telephone Encounter (Signed)
pt called to cx today due to he has a cough

## 2021-02-11 ENCOUNTER — Other Ambulatory Visit: Payer: Self-pay

## 2021-02-11 ENCOUNTER — Ambulatory Visit (HOSPITAL_COMMUNITY): Payer: Federal, State, Local not specified - PPO | Attending: Family Medicine

## 2021-02-11 ENCOUNTER — Encounter (HOSPITAL_COMMUNITY): Payer: Self-pay

## 2021-02-11 DIAGNOSIS — M6281 Muscle weakness (generalized): Secondary | ICD-10-CM | POA: Insufficient documentation

## 2021-02-11 DIAGNOSIS — R2681 Unsteadiness on feet: Secondary | ICD-10-CM | POA: Diagnosis not present

## 2021-02-11 NOTE — Therapy (Signed)
[facility] [place], [place], [address] Phone: [phone]   Fax:  [phone]  Physical Therapy Treatment  Patient Details  Name: [name] MRN: [id number] Date of Birth: January 25, 1961 Referring Provider (PT): Dr. [name]   Encounter Date: 02/11/2021   PT End of Session - 02/11/21 0839    Visit Number 8    Number of Visits 12    Date for PT Re-Evaluation 03/03/21    Authorization Type BCBS/Federal employee, no auth, no VL    Progress Note Due on Visit 16    PT Start Time 0834    PT Stop Time 0917    PT Time Calculation (min) 43 min    Equipment Utilized During Treatment --   SBA   Activity Tolerance Patient tolerated treatment well;Patient limited by fatigue    Behavior During Therapy [facility] for tasks assessed/performed           Past Medical History:  Diagnosis Date  . Anemia   . Arthritis   . CAD (coronary artery disease)    Multivessel disease status post CABG 08/2015  . Cirrhosis ([place])   . CKD (chronic kidney disease) stage 3, GFR 30-59 ml/min (HCC)   . Diastolic CHF ([place])   . Essential hypertension   . Hyperlipidemia   . OSA on CPAP   . Polyclonal gammopathy   . Thrombocytopenia ([place]) 2016  . Type 2 diabetes mellitus ([place])     Past Surgical History:  Procedure Laterality Date  . APPENDECTOMY    . Biceps tendon surgery Right   . BIOPSY  11/22/2019   Procedure: BIOPSY;  Surgeon: Daneil Dolin, MD;  [facility];  Service: Endoscopy;;  . CARDIAC CATHETERIZATION N/A 08/25/2015   Procedure: Left Heart Cath and Coronary Angiography;  Surgeon: Belva Crome, MD;  Location: [place] [facility] CV LAB;  Service: Cardiovascular;  Laterality: N/A;  . COLONOSCOPY WITH PROPOFOL N/A 11/22/2019   Procedure: COLONOSCOPY WITH PROPOFOL;  Surgeon: Daneil Dolin, MD; Four 4-5 mm polyps, findings suggestive of portal colopathy, congested appearing colonic mucosa diffusely, rectal varices, and adequate  right colon prep.  Pathology with tubular adenomas and hyperplastic polyp.  Right colon biopsy with focal active colitis.  Recommendations to repeat colonoscopy in 3 months due to poor prep.  . COLONOSCOPY WITH PROPOFOL N/A 03/17/2020   Procedure: COLONOSCOPY WITH PROPOFOL;  Surgeon: Daneil Dolin, MD;  [facility];  Service: Endoscopy;  Laterality: N/A;  8:45am - pt does not need covid test, was + 2/4 <90 days  . CORONARY ARTERY BYPASS GRAFT N/A 08/29/2015   Procedure: CORONARY ARTERY BYPASS GRAFTING (CABG);  Surgeon: [name], MD;  Location: [place];  Service: Open Heart Surgery;  Laterality: N/A;  . ESOPHAGOGASTRODUODENOSCOPY (EGD) WITH PROPOFOL N/A 11/22/2019   Procedure: ESOPHAGOGASTRODUODENOSCOPY (EGD) WITH PROPOFOL;  Surgeon: Daneil Dolin, MD; 4 columns of grade 2-3 esophageal varices, portal gastropathy, gastric polyp/abnormal gastric mucosa s/p biopsy.  Pathology with hyperplastic polyp, mild chronic gastritis, negative H. pylori.  [name] KNEE ARTHROSCOPY Left   . TEE WITHOUT CARDIOVERSION N/A 08/29/2015   Procedure: TRANSESOPHAGEAL ECHOCARDIOGRAM (TEE);  Surgeon: [name], MD;  Location: [facility];  Service: Open Heart Surgery;  Laterality: N/A;  . TOTAL KNEE ARTHROPLASTY Left 03/09/2017   Procedure: TOTAL KNEE ARTHROPLASTY;  Surgeon: Carole Civil, MD;  [facility];  Service: Orthopedics;  Laterality: Left;    There were no vitals filed for this visit.  Subjective Assessment - 02/11/21 0837    Subjective Soreness Rt knee and Lt shoulder today, pain scale 6/10.  No reports of recent fall.    Currently in Pain? Yes    Pain Score 6     Pain Location Shoulder    Pain Orientation Left   Lt shoulder and Rt knee   Pain Descriptors / Indicators Aching;Sore    Pain Type Acute pain    Pain Onset In the past 7 days    Pain Frequency Intermittent    Aggravating Factors  cold weather    Pain Relieving Factors elevation    Effect of Pain on Daily  Activities limits                             [facility] Adult PT Treatment/Exercise - 02/11/21 0001      Knee/Hip Exercises: Aerobic   Nustep 5' UE/LE L2      Knee/Hip Exercises: Machines for Strengthening   Cybex Leg Press 3pls, 2x 10 slow controlled.  Educated ease to get on/off of machine      Knee/Hip Exercises: Standing   Lateral Step Up Both;10 reps;Hand Hold: 2;Step Height: 4"    Forward Step Up Both;10 reps;Hand Hold: 2;Step Height: 6"    Step Down Both;10 reps;Hand Hold: 2;Step Height: 4"    Functional Squat 15 reps    SLS with Vectors 3x 5" with UE support      Knee/Hip Exercises: Seated   Sit to Sand 10 reps;without UE support               Balance Exercises - 02/11/21 0001      Balance Exercises: Standing   Marching Solid surface;10 reps   toe tapping 6in step alternating no HHA              PT Short Term Goals - 02/03/21 0931      PT SHORT TERM GOAL #1   Title Patient will be independent with HEP in order to improve functional outcomes.    Baseline 02/03/21: reports compliance with HEP 4x a week.    Status Achieved      PT SHORT TERM GOAL #2   Title Patient will manifest low risk for falls per time of 12 sec 5 Times Sit to Stand Test    Baseline 02/03/21: 5 STS no HHA 13.85" standard chair height;  inital eval was 17 sec 20" seat height    Status On-going      PT SHORT TERM GOAL #3   Title Patient will demo improved ambulation as evidenced by distance of 300 ft during 2MWT without need for rest period    Baseline 02/03/21: 2MWT 318ft initial eval was 200 ft with LOB and 4/10 RPE    Status Achieved             PT Long Term Goals - 02/03/21 0939      PT LONG TERM GOAL #1   Title Patient will be able to navigate stairs with reciprocal pattern without compensation in order to demonstrate improved LE strength.    Baseline 02/03/21: difficulty with eccentric control descending Rt LE required BHR and tendency to step-to pattern;  initial eval: BHR and non-reciprocal    Status On-going      PT LONG TERM GOAL #2   Title Patient will demonstrate improved ambulation tolerance as evidenced by distance of 350 ft during 2MWT    Baseline 02/03/21: 2MWT 376ft  initial eval was 200 ft with LOB and 4/10 RPE    Status Achieved                 Plan - 02/11/21 0858    Clinical Impression Statement Began step up training for functional quad strengthening, required Bil UE support for eccentric control due to weakness and for safety. Pt reports he has began [employer], mainly doing walking and cardio machines.  Educated strengthening machines, proper mechanics and to keep weight light to medium and controlled slow reps, verbalized understanding.    Personal Factors and Comorbidities Age;Comorbidity 1;Time since onset of injury/illness/exacerbation    Comorbidities PMH, liver    Examination-Activity Limitations Bed Mobility;Bend;Carry;Lift;Stand;Stairs;Squat;Locomotion Level;Transfers    Examination-Participation Restrictions Community Activity;Yard Work;Meal Prep    Stability/Clinical Decision Making Stable/Uncomplicated    Clinical Decision Making Low    Rehab Potential Good    PT Frequency 2x / week    PT Duration 6 weeks    PT Treatment/Interventions ADLs/Self Care Home Management;Aquatic Therapy;Electrical Stimulation;DME Instruction;Gait training;Stair training;Functional mobility training;Therapeutic activities;Therapeutic exercise;Balance training;Patient/family education;Neuromuscular re-education;Manual techniques;Dry needling;Energy conservation;Spinal Manipulations;Joint Manipulations    PT Next Visit Plan Increased focus with balance training and continue functional strengthening exercises including quad strengthening with stairs.    PT Home Exercise Plan SLR, hip add, 1/18: bridge, STS; 1/27: heel/toe raise and sidelying abd; 2/2: tandem stance and SLS by counter           Patient will benefit from skilled  therapeutic intervention in order to improve the following deficits and impairments:  Abnormal gait,Decreased activity tolerance,Decreased balance,Decreased mobility,Decreased knowledge of use of DME,Decreased endurance,Decreased strength,Difficulty walking,Impaired perceived functional ability,Improper body mechanics,Obesity  Visit Diagnosis: Unsteadiness on feet  Muscle weakness (generalized)     Problem List Patient Active Problem List   Diagnosis Date Noted  . Hypertensive heart and kidney disease with heart failure and with chronic kidney disease stage III ([place]) 12/17/2020  . Chronic diastolic heart failure ([place]) 12/17/2020  . Type 2 diabetes mellitus with stage 3b chronic kidney disease and hypertension ([place]) 12/17/2020  . CKD stage 3 due to type 2 diabetes mellitus ([place]) 12/17/2020  . Hyperlipidemia associated with type 2 diabetes mellitus ([place]) 12/17/2020  . Anxiety 12/17/2020  . Chronic non-seasonal allergic rhinitis 12/17/2020  . Hypotension 12/10/2020  . Acute renal failure superimposed on stage 3b chronic kidney disease ([place]) 12/10/2020  . Acute renal failure superimposed on stage 3b chronic kidney disease, unspecified acute renal failure type ([place]) 12/10/2020  . AKI (acute kidney injury) ([place]) 12/09/2020  . GERD (gastroesophageal reflux disease) 10/09/2020  . Anasarca   . Cirrhosis of liver ([place])   . Acute on chronic heart failure with preserved ejection fraction ([place])   . Liver failure ([place]) 06/20/2020  . Esophageal varices ([place]) 01/09/2020  . History of adenomatous polyp of colon 01/09/2020  . Elevated AST (SGOT) 08/23/2019  . Diarrhea 07/17/2019  . Hypersomnia, persistent 03/12/2019  . Macrocytic anemia 01/18/2019  . Other pancytopenia ([place]) 01/18/2019  . Thrombocytopenia ([place]) 12/30/2018  . Polyclonal gammopathy 12/30/2018  . S/P total knee replacement, left 03/09/17 11/22/2017  . Primary osteoarthritis of left knee 03/09/2017  . History of nocturia 03/02/2017   . OSA on CPAP 03/02/2016  . Hypoxia, sleep related 03/02/2016  . Cyst of mediastinum 01/15/2016  . S/P CABG x 4 08/29/2015  . CAD (coronary artery disease)   . Obesity, Class III, BMI 40-49.9 (morbid obesity) ([place])   . Hyperglycemia   . Pain in the chest 08/23/2015  .  Non-ST elevation MI (NSTEMI) ([place]) 08/23/2015  . Diabetes mellitus type 2, uncontrolled ([place]) 08/23/2015  . Hypertension 08/23/2015  . Hyperlipidemia 08/23/2015  . Type 2 diabetes mellitus with hyperglycemia ([place]) 2016  . Class 2 obesity 2016  . Hyponatremia 2016  . Osteoarthritis, knee 05/24/2012  . Knee pain 10/29/2011  . Knee stiffness 10/29/2011  . S/P right knee arthroscopy 10/26/2011  . Medial meniscus, posterior horn derangement 09/07/2011  . Lateral meniscus derangement 09/07/2011  . OA (osteoarthritis) of knee 09/07/2011  . Rotator cuff syndrome of left shoulder 08/19/2011  . Right knee meniscal tear 08/19/2011   Ihor Austin, LPTA/CLT; CBIS [phone]  Aldona Lento 02/11/2021, 1:53 PM  [facility] [address] [place], [place], [address] Phone: [phone]   Fax:  [phone]  Name: [name] MRN: [id number] Date of Birth: 11-Jun-1961

## 2021-02-16 ENCOUNTER — Encounter (HOSPITAL_COMMUNITY): Payer: Self-pay

## 2021-02-16 ENCOUNTER — Other Ambulatory Visit: Payer: Self-pay

## 2021-02-16 ENCOUNTER — Ambulatory Visit (HOSPITAL_COMMUNITY): Payer: Federal, State, Local not specified - PPO

## 2021-02-16 DIAGNOSIS — M6281 Muscle weakness (generalized): Secondary | ICD-10-CM | POA: Diagnosis not present

## 2021-02-16 DIAGNOSIS — R2681 Unsteadiness on feet: Secondary | ICD-10-CM

## 2021-02-16 NOTE — Therapy (Signed)
[name], [place], [address] Phone: [phone]   Fax:  [phone]  Physical Therapy Treatment  Patient Details  Name: [name] MRN: [id number] Date of Birth: [birth year] Referring Provider (PT): Dr. [name]   Encounter Date: 02/16/2021   PT End of Session - 02/16/21 1613    Visit Number 9    Number of Visits 12    Date for PT Re-Evaluation 03/03/21    Authorization Type BCBS/Federal employee, no auth, no VL    Progress Note Due on Visit 16    PT Start Time 1607    PT Stop Time 1645    PT Time Calculation (min) 38 min    Equipment Utilized During Treatment --   SBA   Activity Tolerance Patient tolerated treatment well;Patient limited by fatigue    Behavior During Therapy [facility] for tasks assessed/performed           Past Medical History:  Diagnosis Date  . Anemia   . Arthritis   . CAD (coronary artery disease)    Multivessel disease status post CABG 08/2015  . Cirrhosis ([place])   . CKD (chronic kidney disease) stage 3, GFR 30-59 ml/min (HCC)   . Diastolic CHF ([place])   . Essential hypertension   . Hyperlipidemia   . OSA on CPAP   . Polyclonal gammopathy   . Thrombocytopenia ([place]) 2016  . Type 2 diabetes mellitus ([place])     Past Surgical History:  Procedure Laterality Date  . APPENDECTOMY    . Biceps tendon surgery Right   . BIOPSY  11/22/2019   Procedure: BIOPSY;  Surgeon: Daneil Dolin, MD;  [facility];  Service: Endoscopy;;  . CARDIAC CATHETERIZATION N/A 08/25/2015   Procedure: Left Heart Cath and Coronary Angiography;  Surgeon: Belva Crome, MD;  Location: [place] CV LAB;  Service: Cardiovascular;  Laterality: N/A;  . COLONOSCOPY WITH PROPOFOL N/A 11/22/2019   Procedure: COLONOSCOPY WITH PROPOFOL;  Surgeon: Daneil Dolin, MD; Four 4-5 mm polyps, findings suggestive of portal colopathy, congested appearing colonic mucosa diffusely, rectal varices, and adequate  right colon prep.  Pathology with tubular adenomas and hyperplastic polyp.  Right colon biopsy with focal active colitis.  Recommendations to repeat colonoscopy in 3 months due to poor prep.  . COLONOSCOPY WITH PROPOFOL N/A 03/17/2020   Procedure: COLONOSCOPY WITH PROPOFOL;  Surgeon: Daneil Dolin, MD;  [facility];  Service: Endoscopy;  Laterality: N/A;  8:45am - pt does not need covid test, was + 2/4 <90 days  . CORONARY ARTERY BYPASS GRAFT N/A 08/29/2015   Procedure: CORONARY ARTERY BYPASS GRAFTING (CABG);  Surgeon: [name], MD;  Location: [place];  Service: Open Heart Surgery;  Laterality: N/A;  . ESOPHAGOGASTRODUODENOSCOPY (EGD) WITH PROPOFOL N/A 11/22/2019   Procedure: ESOPHAGOGASTRODUODENOSCOPY (EGD) WITH PROPOFOL;  Surgeon: Daneil Dolin, MD; 4 columns of grade 2-3 esophageal varices, portal gastropathy, gastric polyp/abnormal gastric mucosa s/p biopsy.  Pathology with hyperplastic polyp, mild chronic gastritis, negative H. pylori.  [name] KNEE ARTHROSCOPY Left   . TEE WITHOUT CARDIOVERSION N/A 08/29/2015   Procedure: TRANSESOPHAGEAL ECHOCARDIOGRAM (TEE);  Surgeon: [name], MD;  Location: [place];  Service: Open Heart Surgery;  Laterality: N/A;  . TOTAL KNEE ARTHROPLASTY Left 03/09/2017   Procedure: TOTAL KNEE ARTHROPLASTY;  Surgeon: Carole Civil, MD;  [facility];  Service: Orthopedics;  Laterality: Left;    There were no vitals filed for this visit.  Subjective Assessment - 02/16/21 1612    Subjective Pt reports continued left shoulder soreness and right knee but not feeling any worse. Pt denies falls or hypotension events    Currently in Pain? Yes    Pain Score 3     Pain Location Shoulder    Pain Orientation Left    Pain Descriptors / Indicators Aching    Pain Type Acute pain    Pain Onset In the past 7 days              [facility] PT Assessment - 02/16/21 0001      Assessment   Medical Diagnosis Hypotension    Referring Provider (PT)  Dr. [name]                         [facility] Adult PT Treatment/Exercise - 02/16/21 0001      Knee/Hip Exercises: Aerobic   Nustep 5' UE/LE L3      Knee/Hip Exercises: Machines for Strengthening   Cybex Leg Press 4 pls, 2x15 emphasis on eccentric control      Knee/Hip Exercises: Standing   Other Standing Knee Exercises resisted backward/forward walking 3 plates 2x2 min                  PT Education - 02/16/21 1637    Education Details pt educated in HEP activities to improve activity tolerance. Encouraged to use cane to provide support for RLE    Person(s) Educated Patient    Methods Explanation    Comprehension Verbalized understanding            PT Short Term Goals - 02/03/21 0931      PT SHORT TERM GOAL #1   Title Patient will be independent with HEP in order to improve functional outcomes.    Baseline 02/03/21: reports compliance with HEP 4x a week.    Status Achieved      PT SHORT TERM GOAL #2   Title Patient will manifest low risk for falls per time of 12 sec 5 Times Sit to Stand Test    Baseline 02/03/21: 5 STS no HHA 13.85" standard chair height;  inital eval was 17 sec 20" seat height    Status On-going      PT SHORT TERM GOAL #3   Title Patient will demo improved ambulation as evidenced by distance of 300 ft during 2MWT without need for rest period    Baseline 02/03/21: 2MWT 368ft initial eval was 200 ft with LOB and 4/10 RPE    Status Achieved             PT Long Term Goals - 02/03/21 0939      PT LONG TERM GOAL #1   Title Patient will be able to navigate stairs with reciprocal pattern without compensation in order to demonstrate improved LE strength.    Baseline 02/03/21: difficulty with eccentric control descending Rt LE required BHR and tendency to step-to pattern; initial eval: BHR and non-reciprocal    Status On-going      PT LONG TERM GOAL #2   Title Patient will demonstrate improved ambulation tolerance as evidenced by  distance of 350 ft during 2MWT    Baseline 02/03/21: 2MWT 377ft initial eval was 200 ft with LOB and 4/10 RPE    Status Achieved                 Plan - 02/16/21 1646    Clinical Impression Statement Continues  to exhibit RLE deficits and difficulty with single limb support throughout activities requiring RLE weight acceptance and stability.  Continued tx sessions indicated to improve activity tolerance and reduce risk for falls.    Personal Factors and Comorbidities Age;Comorbidity 1;Time since onset of injury/illness/exacerbation    Comorbidities PMH, liver    Examination-Activity Limitations Bed Mobility;Bend;Carry;Lift;Stand;Stairs;Squat;Locomotion Level;Transfers    Examination-Participation Restrictions Community Activity;Yard Work;Meal Prep    Stability/Clinical Decision Making Stable/Uncomplicated    Rehab Potential Good    PT Frequency 2x / week    PT Duration 6 weeks    PT Treatment/Interventions ADLs/Self Care Home Management;Aquatic Therapy;Electrical Stimulation;DME Instruction;Gait training;Stair training;Functional mobility training;Therapeutic activities;Therapeutic exercise;Balance training;Patient/family education;Neuromuscular re-education;Manual techniques;Dry needling;Energy conservation;Spinal Manipulations;Joint Manipulations    PT Next Visit Plan Increased focus with balance training and continue functional strengthening exercises including quad strengthening with stairs.    PT Home Exercise Plan SLR, hip add, 1/18: bridge, STS; 1/27: heel/toe raise and sidelying abd; 2/2: tandem stance and SLS by counter           Patient will benefit from skilled therapeutic intervention in order to improve the following deficits and impairments:  Abnormal gait,Decreased activity tolerance,Decreased balance,Decreased mobility,Decreased knowledge of use of DME,Decreased endurance,Decreased strength,Difficulty walking,Impaired perceived functional ability,Improper body  mechanics,Obesity  Visit Diagnosis: Unsteadiness on feet  Muscle weakness (generalized)     Problem List Patient Active Problem List   Diagnosis Date Noted  . Hypertensive heart and kidney disease with heart failure and with chronic kidney disease stage III ([name]) 12/17/2020  . Chronic diastolic heart failure ([place]) 12/17/2020  . Type 2 diabetes mellitus with stage 3b chronic kidney disease and hypertension ([place]) 12/17/2020  . CKD stage 3 due to type 2 diabetes mellitus ([place]) 12/17/2020  . Hyperlipidemia associated with type 2 diabetes mellitus ([place]) 12/17/2020  . Anxiety 12/17/2020  . Chronic non-seasonal allergic rhinitis 12/17/2020  . Hypotension 12/10/2020  . Acute renal failure superimposed on stage 3b chronic kidney disease ([place]) 12/10/2020  . Acute renal failure superimposed on stage 3b chronic kidney disease, unspecified acute renal failure type ([place]) 12/10/2020  . AKI (acute kidney injury) ([place]) 12/09/2020  . GERD (gastroesophageal reflux disease) 10/09/2020  . Anasarca   . Cirrhosis of liver ([place])   . Acute on chronic heart failure with preserved ejection fraction ([place])   . Liver failure ([place]) 06/20/2020  . Esophageal varices ([place]) 01/09/2020  . History of adenomatous polyp of colon 01/09/2020  . Elevated AST (SGOT) 08/23/2019  . Diarrhea 07/17/2019  . Hypersomnia, persistent 03/12/2019  . Macrocytic anemia 01/18/2019  . Other pancytopenia ([place]) 01/18/2019  . Thrombocytopenia ([place]) 12/30/2018  . Polyclonal gammopathy 12/30/2018  . S/P total knee replacement, left 03/09/17 11/22/2017  . Primary osteoarthritis of left knee 03/09/2017  . History of nocturia 03/02/2017  . OSA on CPAP 03/02/2016  . Hypoxia, sleep related 03/02/2016  . Cyst of mediastinum 01/15/2016  . S/P CABG x 4 08/29/2015  . CAD (coronary artery disease)   . Obesity, Class III, BMI 40-49.9 (morbid obesity) ([place])   . Hyperglycemia   . Pain in the chest 08/23/2015  . Non-ST elevation MI (NSTEMI)  ([place]) 08/23/2015  . Diabetes mellitus type 2, uncontrolled ([place]) 08/23/2015  . Hypertension 08/23/2015  . Hyperlipidemia 08/23/2015  . Type 2 diabetes mellitus with hyperglycemia ([place]) 2016  . Class 2 obesity 2016  . Hyponatremia 2016  . Osteoarthritis, knee 05/24/2012  . Knee pain 10/29/2011  . Knee stiffness 10/29/2011  . S/P right knee arthroscopy 10/26/2011  .  Medial meniscus, posterior horn derangement 09/07/2011  . Lateral meniscus derangement 09/07/2011  . OA (osteoarthritis) of knee 09/07/2011  . Rotator cuff syndrome of left shoulder 08/19/2011  . Right knee meniscal tear 08/19/2011   4:49 PM, 02/16/21 M. Sherlyn Lees, PT, DPT Physical Therapist- [place] Office Number: [phone]  [facility] [address] [place], [place], [address] Phone: [phone]   Fax:  [phone]  Name: MEDARD DECUIR MRN: [id number] Date of Birth: 1961/05/11

## 2021-02-18 ENCOUNTER — Encounter (HOSPITAL_COMMUNITY): Payer: Self-pay

## 2021-02-18 ENCOUNTER — Ambulatory Visit (HOSPITAL_COMMUNITY): Payer: Federal, State, Local not specified - PPO

## 2021-02-18 ENCOUNTER — Other Ambulatory Visit: Payer: Self-pay

## 2021-02-18 DIAGNOSIS — R2681 Unsteadiness on feet: Secondary | ICD-10-CM | POA: Diagnosis not present

## 2021-02-18 DIAGNOSIS — M6281 Muscle weakness (generalized): Secondary | ICD-10-CM | POA: Diagnosis not present

## 2021-02-18 NOTE — Therapy (Signed)
[facility] [address] [place], [place], [address] Phone: [phone]   Fax:  [phone]  Physical Therapy Treatment  Patient Details  Name: [name] MRN: [id number] Date of Birth: [birth year] Referring Provider (PT): Dr. [name]   Encounter Date: 02/18/2021   PT End of Session - 02/18/21 0925    Visit Number 10    Number of Visits 12    Date for PT Re-Evaluation 03/03/21    Authorization Type BCBS/Federal employee, no auth, no VL    Progress Note Due on Visit 16    PT Start Time 0925   late arrival then restroom break   PT Stop Time 1008   EOS on Nustep, not included wiht charges   PT Time Calculation (min) 43 min    Activity Tolerance Patient tolerated treatment well;Patient limited by fatigue    Behavior During Therapy [facility] for tasks assessed/performed           Past Medical History:  Diagnosis Date  . Anemia   . Arthritis   . CAD (coronary artery disease)    Multivessel disease status post CABG 08/2015  . Cirrhosis ([place])   . CKD (chronic kidney disease) stage 3, GFR 30-59 ml/min (HCC)   . Diastolic CHF ([place])   . Essential hypertension   . Hyperlipidemia   . OSA on CPAP   . Polyclonal gammopathy   . Thrombocytopenia ([place]) 2016  . Type 2 diabetes mellitus ([place])     Past Surgical History:  Procedure Laterality Date  . APPENDECTOMY    . Biceps tendon surgery Right   . BIOPSY  11/22/2019   Procedure: BIOPSY;  Surgeon: Daneil Dolin, MD;  [facility];  Service: Endoscopy;;  . CARDIAC CATHETERIZATION N/A 08/25/2015   Procedure: Left Heart Cath and Coronary Angiography;  Surgeon: Belva Crome, MD;  Location: [place] CV LAB;  Service: Cardiovascular;  Laterality: N/A;  . COLONOSCOPY WITH PROPOFOL N/A 11/22/2019   Procedure: COLONOSCOPY WITH PROPOFOL;  Surgeon: Daneil Dolin, MD; Four 4-5 mm polyps, findings suggestive of portal colopathy, congested appearing colonic mucosa diffusely,  rectal varices, and adequate right colon prep.  Pathology with tubular adenomas and hyperplastic polyp.  Right colon biopsy with focal active colitis.  Recommendations to repeat colonoscopy in 3 months due to poor prep.  . COLONOSCOPY WITH PROPOFOL N/A 03/17/2020   Procedure: COLONOSCOPY WITH PROPOFOL;  Surgeon: Daneil Dolin, MD;  [facility];  Service: Endoscopy;  Laterality: N/A;  8:45am - pt does not need covid test, was + 2/4 <90 days  . CORONARY ARTERY BYPASS GRAFT N/A 08/29/2015   Procedure: CORONARY ARTERY BYPASS GRAFTING (CABG);  Surgeon: [name], MD;  Location: [place];  Service: Open Heart Surgery;  Laterality: N/A;  . ESOPHAGOGASTRODUODENOSCOPY (EGD) WITH PROPOFOL N/A 11/22/2019   Procedure: ESOPHAGOGASTRODUODENOSCOPY (EGD) WITH PROPOFOL;  Surgeon: Daneil Dolin, MD; 4 columns of grade 2-3 esophageal varices, portal gastropathy, gastric polyp/abnormal gastric mucosa s/p biopsy.  Pathology with hyperplastic polyp, mild chronic gastritis, negative H. pylori.  [name] KNEE ARTHROSCOPY Left   . TEE WITHOUT CARDIOVERSION N/A 08/29/2015   Procedure: TRANSESOPHAGEAL ECHOCARDIOGRAM (TEE);  Surgeon: [name], MD;  Location: [place];  Service: Open Heart Surgery;  Laterality: N/A;  . TOTAL KNEE ARTHROPLASTY Left 03/09/2017   Procedure: TOTAL KNEE ARTHROPLASTY;  Surgeon: Carole Civil, MD;  [facility];  Service: Orthopedics;  Laterality: Left;    There were no vitals  filed for this visit.   Subjective Assessment - 02/18/21 0926    Subjective Reports increased pain Rt knee due to the rain, pain scale 8/10.    Currently in Pain? Yes    Pain Score 8     Pain Location Knee    Pain Orientation Right    Pain Descriptors / Indicators Sore    Pain Type Acute pain    Pain Onset More than a month ago    Pain Frequency Intermittent    Aggravating Factors  cold weather, rain    Pain Relieving Factors elevation, ice    Effect of Pain on Daily Activities limits                              [facility] Adult PT Treatment/Exercise - 02/18/21 0001      Knee/Hip Exercises: Aerobic   Nustep 5' UE/LE L3      Knee/Hip Exercises: Machines for Strengthening   Cybex Leg Press 4 pls, 2x15 emphasis on eccentric control      Knee/Hip Exercises: Standing   Heel Raises 2 sets;10 reps    Heel Raises Limitations toe raises with UE Support    Other Standing Knee Exercises resisted backward/forward walking 3 plates 2x2 min               Balance Exercises - 02/18/21 0001      Balance Exercises: Standing   Tandem Stance Eyes open;Foam/compliant surface;3 reps;30 secs    SLS with Vectors 3 reps;Upper extremity assist 1   3x 5" required HHA   Marching Solid surface;20 reps   alternating toe tapping 6in step no HHA              PT Short Term Goals - 02/03/21 0931      PT SHORT TERM GOAL #1   Title Patient will be independent with HEP in order to improve functional outcomes.    Baseline 02/03/21: reports compliance with HEP 4x a week.    Status Achieved      PT SHORT TERM GOAL #2   Title Patient will manifest low risk for falls per time of 12 sec 5 Times Sit to Stand Test    Baseline 02/03/21: 5 STS no HHA 13.85" standard chair height;  inital eval was 17 sec 20" seat height    Status On-going      PT SHORT TERM GOAL #3   Title Patient will demo improved ambulation as evidenced by distance of 300 ft during 2MWT without need for rest period    Baseline 02/03/21: 2MWT 337ft initial eval was 200 ft with LOB and 4/10 RPE    Status Achieved             PT Long Term Goals - 02/03/21 0939      PT LONG TERM GOAL #1   Title Patient will be able to navigate stairs with reciprocal pattern without compensation in order to demonstrate improved LE strength.    Baseline 02/03/21: difficulty with eccentric control descending Rt LE required BHR and tendency to step-to pattern; initial eval: BHR and non-reciprocal    Status On-going      PT LONG  TERM GOAL #2   Title Patient will demonstrate improved ambulation tolerance as evidenced by distance of 350 ft during 2MWT    Baseline 02/03/21: 2MWT 333ft initial eval was 200 ft with LOB and 4/10 RPE    Status Achieved  Plan - 02/18/21 0959    Clinical Impression Statement Session focus with static dynamic surface balance activities and functional strengthening.  Pt tolerated well with no reports of increased pain during closed chain exercises.  Continues to exhibit Rt LE deficits with quad weakness and difficulty with SLS without UE support.    Personal Factors and Comorbidities Age;Comorbidity 1;Time since onset of injury/illness/exacerbation    Comorbidities PMH, liver    Examination-Activity Limitations Bed Mobility;Bend;Carry;Lift;Stand;Stairs;Squat;Locomotion Level;Transfers    Examination-Participation Restrictions Community Activity;Yard Work;Meal Prep    Stability/Clinical Decision Making Stable/Uncomplicated    Clinical Decision Making Low    Rehab Potential Good    PT Frequency 2x / week    PT Duration 6 weeks    PT Treatment/Interventions ADLs/Self Care Home Management;Aquatic Therapy;Electrical Stimulation;DME Instruction;Gait training;Stair training;Functional mobility training;Therapeutic activities;Therapeutic exercise;Balance training;Patient/family education;Neuromuscular re-education;Manual techniques;Dry needling;Energy conservation;Spinal Manipulations;Joint Manipulations    PT Next Visit Plan Increased focus with balance training and continue functional strengthening exercises including quad strengthening with stairs.    PT Home Exercise Plan SLR, hip add, 1/18: bridge, STS; 1/27: heel/toe raise and sidelying abd; 2/2: tandem stance and SLS by counter           Patient will benefit from skilled therapeutic intervention in order to improve the following deficits and impairments:  Abnormal gait,Decreased activity tolerance,Decreased  balance,Decreased mobility,Decreased knowledge of use of DME,Decreased endurance,Decreased strength,Difficulty walking,Impaired perceived functional ability,Improper body mechanics,Obesity  Visit Diagnosis: Unsteadiness on feet  Muscle weakness (generalized)     Problem List Patient Active Problem List   Diagnosis Date Noted  . Hypertensive heart and kidney disease with heart failure and with chronic kidney disease stage III ([place]) 12/17/2020  . Chronic diastolic heart failure ([place]) 12/17/2020  . Type 2 diabetes mellitus with stage 3b chronic kidney disease and hypertension ([place]) 12/17/2020  . CKD stage 3 due to type 2 diabetes mellitus ([place]) 12/17/2020  . Hyperlipidemia associated with type 2 diabetes mellitus ([place]) 12/17/2020  . Anxiety 12/17/2020  . Chronic non-seasonal allergic rhinitis 12/17/2020  . Hypotension 12/10/2020  . Acute renal failure superimposed on stage 3b chronic kidney disease ([place]) 12/10/2020  . Acute renal failure superimposed on stage 3b chronic kidney disease, unspecified acute renal failure type ([place]) 12/10/2020  . AKI (acute kidney injury) ([place]) 12/09/2020  . GERD (gastroesophageal reflux disease) 10/09/2020  . Anasarca   . Cirrhosis of liver ([place])   . Acute on chronic heart failure with preserved ejection fraction ([place])   . Liver failure ([place]) 06/20/2020  . Esophageal varices ([place]) 01/09/2020  . History of adenomatous polyp of colon 01/09/2020  . Elevated AST (SGOT) 08/23/2019  . Diarrhea 07/17/2019  . Hypersomnia, persistent 03/12/2019  . Macrocytic anemia 01/18/2019  . Other pancytopenia ([place]) 01/18/2019  . Thrombocytopenia ([place]) 12/30/2018  . Polyclonal gammopathy 12/30/2018  . S/P total knee replacement, left 03/09/17 11/22/2017  . Primary osteoarthritis of left knee 03/09/2017  . History of nocturia 03/02/2017  . OSA on CPAP 03/02/2016  . Hypoxia, sleep related 03/02/2016  . Cyst of mediastinum 01/15/2016  . S/P CABG x 4 08/29/2015  . CAD  (coronary artery disease)   . Obesity, Class III, BMI 40-49.9 (morbid obesity) ([place])   . Hyperglycemia   . Pain in the chest 08/23/2015  . Non-ST elevation MI (NSTEMI) ([place]) 08/23/2015  . Diabetes mellitus type 2, uncontrolled ([place]) 08/23/2015  . Hypertension 08/23/2015  . Hyperlipidemia 08/23/2015  . Type 2 diabetes mellitus with hyperglycemia ([place]) 2016  . Class 2 obesity 2016  .  Hyponatremia 2016  . Osteoarthritis, knee 05/24/2012  . Knee pain 10/29/2011  . Knee stiffness 10/29/2011  . S/P right knee arthroscopy 10/26/2011  . Medial meniscus, posterior horn derangement 09/07/2011  . Lateral meniscus derangement 09/07/2011  . OA (osteoarthritis) of knee 09/07/2011  . Rotator cuff syndrome of left shoulder 08/19/2011  . Right knee meniscal tear 08/19/2011   Ihor Austin, LPTA/CLT; CBIS [phone]  Aldona Lento 02/18/2021, 10:08 AM  [facility] [address] [place], [place], [address] Phone: [phone]   Fax:  [phone]  Name: [name] MRN: [id number] Date of Birth: 10-21-1961

## 2021-02-19 DIAGNOSIS — M79671 Pain in right foot: Secondary | ICD-10-CM | POA: Diagnosis not present

## 2021-02-19 DIAGNOSIS — E1151 Type 2 diabetes mellitus with diabetic peripheral angiopathy without gangrene: Secondary | ICD-10-CM | POA: Diagnosis not present

## 2021-02-19 DIAGNOSIS — L110 Acquired keratosis follicularis: Secondary | ICD-10-CM | POA: Diagnosis not present

## 2021-02-19 DIAGNOSIS — I739 Peripheral vascular disease, unspecified: Secondary | ICD-10-CM | POA: Diagnosis not present

## 2021-02-20 DIAGNOSIS — J45909 Unspecified asthma, uncomplicated: Secondary | ICD-10-CM | POA: Diagnosis not present

## 2021-02-20 DIAGNOSIS — Z6837 Body mass index (BMI) 37.0-37.9, adult: Secondary | ICD-10-CM | POA: Diagnosis not present

## 2021-02-25 ENCOUNTER — Telehealth (HOSPITAL_COMMUNITY): Payer: Self-pay | Admitting: Physical Therapy

## 2021-02-25 ENCOUNTER — Ambulatory Visit (HOSPITAL_COMMUNITY): Payer: Federal, State, Local not specified - PPO | Admitting: Physical Therapy

## 2021-02-25 NOTE — Telephone Encounter (Signed)
No show. Patient reported he felt sick and forgot to cancel appointment. Confirmed next appointment.   1:40 PM, 02/25/21 Jerene Pitch, DPT Physical Therapy with [facility]  [phone] office

## 2021-02-26 ENCOUNTER — Ambulatory Visit: Payer: Federal, State, Local not specified - PPO | Admitting: Student

## 2021-02-26 NOTE — Progress Notes (Signed)
Cardiology Office Note  Date: 02/27/2021   ID: Christopher, [name] March 20, 1961, MRN [id number]  PCP:  [name], MD  Cardiologist:  Rozann Lesches, MD Electrophysiologist:  None   Chief Complaint  Patient presents with  . Cardiac follow-up    History of Present Illness: [name] is a 60 y.o. male last seen in January.  He is here for a routine follow-up visit.  Reports no active angina symptoms on medical therapy.  He continues to follow with nephrology and has maintained Demadex at 20 mg daily and Aldactone 50 mg daily with most recent creatinine 1.66 and normal potassium.  He continues to follow at [facility] undergoing evaluation for possible liver transplantation, states that he is currently listed and has a visit scheduled next week for follow-up.[name]  He was also seen by [facility] cardiology.  Echocardiogram in January revealed normal LVEF with mild aortic stenosis.  PET myocardial perfusion imaging indicated inferior wall scar with peri-infarct ischemia.  I reviewed his current medications and most recent lab work.  Past Medical History:  Diagnosis Date  . Anemia   . Arthritis   . CAD (coronary artery disease)    Multivessel disease status post CABG 08/2015  . Cirrhosis ([place])   . CKD (chronic kidney disease) stage 3, GFR 30-59 ml/min (HCC)   . Diastolic CHF ([place])   . Essential hypertension   . Hyperlipidemia   . OSA on CPAP   . Polyclonal gammopathy   . Thrombocytopenia ([place]) 2016  . Type 2 diabetes mellitus ([place])     Past Surgical History:  Procedure Laterality Date  . APPENDECTOMY    . Biceps tendon surgery Right   . BIOPSY  11/22/2019   Procedure: BIOPSY;  Surgeon: Daneil Dolin, MD;  [facility];  Service: Endoscopy;;  . CARDIAC CATHETERIZATION N/A 08/25/2015   Procedure: Left Heart Cath and Coronary Angiography;  Surgeon: Belva Crome, MD;  Location: [place] CV LAB;  Service: Cardiovascular;  Laterality: N/A;  . COLONOSCOPY WITH  PROPOFOL N/A 11/22/2019   Procedure: COLONOSCOPY WITH PROPOFOL;  Surgeon: Daneil Dolin, MD; Four 4-5 mm polyps, findings suggestive of portal colopathy, congested appearing colonic mucosa diffusely, rectal varices, and adequate right colon prep.  Pathology with tubular adenomas and hyperplastic polyp.  Right colon biopsy with focal active colitis.  Recommendations to repeat colonoscopy in 3 months due to poor prep.  . COLONOSCOPY WITH PROPOFOL N/A 03/17/2020   Procedure: COLONOSCOPY WITH PROPOFOL;  Surgeon: Daneil Dolin, MD;  [facility];  Service: Endoscopy;  Laterality: N/A;  8:45am - pt does not need covid test, was + 2/4 <90 days  . CORONARY ARTERY BYPASS GRAFT N/A 08/29/2015   Procedure: CORONARY ARTERY BYPASS GRAFTING (CABG);  Surgeon: [name], MD;  Location: [place];  Service: Open Heart Surgery;  Laterality: N/A;  . ESOPHAGOGASTRODUODENOSCOPY (EGD) WITH PROPOFOL N/A 11/22/2019   Procedure: ESOPHAGOGASTRODUODENOSCOPY (EGD) WITH PROPOFOL;  Surgeon: Daneil Dolin, MD; 4 columns of grade 2-3 esophageal varices, portal gastropathy, gastric polyp/abnormal gastric mucosa s/p biopsy.  Pathology with hyperplastic polyp, mild chronic gastritis, negative H. pylori.  [name] KNEE ARTHROSCOPY Left   . TEE WITHOUT CARDIOVERSION N/A 08/29/2015   Procedure: TRANSESOPHAGEAL ECHOCARDIOGRAM (TEE);  Surgeon: [name], MD;  Location: [place];  Service: Open Heart Surgery;  Laterality: N/A;  . TOTAL KNEE ARTHROPLASTY Left 03/09/2017   Procedure: TOTAL KNEE ARTHROPLASTY;  Surgeon: Carole Civil, MD;  Location: [facility];  Service: Orthopedics;  Laterality: Left;    Current Outpatient Medications  Medication Sig Dispense Refill  . acetaminophen (TYLENOL) 325 MG tablet Take 650 mg by mouth every 6 (six) hours as needed.    [name] albuterol (VENTOLIN HFA) 108 (90 Base) MCG/ACT inhaler Inhale 2 puffs into the lungs every 6 (six) hours as needed for wheezing or shortness of breath. 18 g 0   . ALPRAZolam (XANAX) 0.5 MG tablet Take 1 tablet (0.5 mg total) by mouth daily as needed. 10 tablet 0  . Armodafinil 200 MG TABS Take 200 mg by mouth daily as needed (for sleepiness). 30 tablet 0  . aspirin EC 81 MG tablet Take 81 mg by mouth at bedtime.     . Cholecalciferol (DIALYVITE VITAMIN D 5000) 125 MCG (5000 UT) capsule Take 5,000 Units by mouth in the morning.     . Continuous Blood Gluc Receiver (FREESTYLE LIBRE 14 DAY READER) DEVI 1 reader 10 each 0  . Continuous Blood Gluc Sensor (FREESTYLE LIBRE 14 DAY SENSOR) MISC 1 sensor    . Elastic Bandages & Supports (MEDICAL COMPRESSION STOCKINGS) MISC 1 each by Does not apply route as directed. Low pressure knee high compression stockings Dx: leg swelling 1 each 0  . ferrous sulfate 325 (65 FE) MG tablet Take 325 mg by mouth in the morning.     . Flaxseed, Linseed, (FLAXSEED OIL) 1000 MG CAPS Take 1,000 mg by mouth at bedtime.    . INS SYRINGE/NEEDLE .5CC/28G 28G X 1/2" 0.5 ML MISC Inject 1 Syringe into the skin See admin instructions. 100 each 0  . insulin aspart (NOVOLOG) 100 UNIT/ML injection Inject 5 Units into the skin 3 (three) times daily before meals. Special Instructions: If accu-check is greater than 150. Hold for accu-check 150 or below. With Meals 10 mL 0  . insulin degludec (TRESIBA FLEXTOUCH) 200 UNIT/ML FlexTouch Pen Inject 40 Units into the skin in the morning. Give 30 units Subcutaneous  at Bedtime 8:00 pm 15 mL 0  . lactulose (CHRONULAC) 10 GM/15ML solution Take 30 mLs (20 g total) by mouth daily. 900 mL 0  . loratadine (CLARITIN) 10 MG tablet Take 10 mg by mouth in the morning.     . Magnesium 250 MG TABS Take 250 mg by mouth daily.    . Misc Natural Products (ELDERBERRY ZINC/VIT C/IMMUNE) LOZG Take 1 lozenge by mouth at bedtime.    . NON FORMULARY Diet: _____ Regular, __x____ NAS, __x_____Consistent Carbohydrate, _______NPO _____Other    . omeprazole (PRILOSEC) 40 MG capsule Take 1 capsule (40 mg total) by mouth at  bedtime. 30 capsule 0  . OZEMPIC, 1 MG/DOSE, 4 MG/3ML SOPN Inject 1 mg into the skin once a week. 3 mL 0  . potassium chloride SA (KLOR-CON) 20 MEQ tablet Take 20 mEq by mouth 2 (two) times daily.    . pravastatin (PRAVACHOL) 20 MG tablet TAKE 1 TABLET(20 MG) BY MOUTH AT BEDTIME 30 tablet 0  . spironolactone (ALDACTONE) 100 MG tablet Take 0.5 tablets (50 mg total) by mouth daily. 15 tablet 0  . torsemide (DEMADEX) 20 MG tablet Take 1 tablet (20 mg total) by mouth daily. 90 tablet 3  . TURMERIC CURCUMIN PO Take 2,000 mg by mouth at bedtime.      No current facility-administered medications for this visit.   Allergies:  Fluticasone   ROS: No syncope.  Physical Exam: VS:  BP 106/66   Pulse 100   Ht 5\' 10"  (1.778 m)   Wt 264  lb (119.7 kg)   SpO2 99%   BMI 37.88 kg/m , BMI Body mass index is 37.88 kg/m.  Wt Readings from Last 3 Encounters:  02/27/21 264 lb (119.7 kg)  01/26/21 252 lb (114.3 kg)  01/09/21 256 lb (116.1 kg)    General: Patient appears comfortable at rest. HEENT: Conjunctiva and lids normal, wearing a mask. Neck: No elevated JVP. Lungs: Clear to auscultation, nonlabored breathing at rest. Cardiac: Regular rate and rhythm, no S3 or significant systolic murmur, no pericardial rub. Abdomen: Protuberant, bowel sounds present. Extremities: Continues to use compression stockings.  ECG:  An ECG dated 12/09/2020 was personally reviewed today and demonstrated:  Sinus rhythm with low voltage, nonspecific T wave changes.  Recent Labwork: 06/20/2020: B Natriuretic Peptide 601.0 12/14/2020: Magnesium 1.7 12/19/2020: ALT 13; AST 19 02/02/2021: BUN 36; Creatinine, Ser 1.66; Hemoglobin 13.0; Platelets 74; Potassium 4.7; Sodium 130; TSH 1.901     Component Value Date/Time   CHOL 120 05/25/2019 1001   TRIG 136 05/25/2019 1001   HDL 38 (L) 05/25/2019 1001   CHOLHDL 3.2 05/25/2019 1001   VLDL 27 05/25/2019 1001   LDLCALC 55 05/25/2019 1001    Other Studies Reviewed  Today:  Echocardiogram 01/06/2021 ([facility]): INTERPRETATION --------------------------------------------------------------- NORMAL LEFT VENTRICULAR SYSTOLIC FUNCTION NORMAL RIGHT VENTRICULAR SYSTOLIC FUNCTION NO VALVULAR REGURGITATION VALVULAR STENOSIS: MILD AS POOR SOUND TRANSMISSION  PET myocardial perfusion imaging 01/16/2021 ([facility]): Image Analysis:    Rest Stress Conclusion  Basal Anterior Normal Normal Normal  Basal Ant Septal Normal Normal Normal  Basal Inf Septal Normal Normal Normal  Basal Inferior Normal Moderate Ischemia  Basal Inf Lat Normal Normal Normal  Basal Ant Lat Normal Normal Normal  Mid Anterior Normal Normal Normal  Mid Ant Septal Normal Normal Normal  Mid Inf Septal Normal Normal Normal  Mid Inferior Mild Moderate Infrc/Isch  Mid Inf Lateral Normal Normal Normal  Mid Ant Lateral Normal Normal Normal  Apical AnteriorNormal Normal Normal  Apical Septal Normal Normal Normal  Apical Inferior Normal Normal Normal  Apical Lateral Normal Normal Normal  Apex Normal Normal Normal  SPECT RESULTS  Technical Quality: Excellent   PERFUSION: Abnormal rest and stress perfusion.    STRESS TEST Pharmacologic  Protocol: Regadenoson Dose: 0.4 mg  Stage Dose Time (min) BP (mmHg) HR (bpm) Comments  Resting  129/56 65 PVC  Stage 1 0.4 1:00 94   Recovery 1 2:00 93 Technical difficulties with BP monitor  Recovery 2 2:00 91 Technical difficulties with BP monitor  Recovery 3 2:00 110/58 90   Recovery 4 2:00 111/56 88 PVC   Resting HR (bpm): 65 Resting BP (mmHg): 129 / 56 MaxPHR: 159 Target HR (bpm): 135  Peak HR (bpm): 94 Peak BP (mmHg): 111 / 56 % MaxPHR: 59 Double Product: 35329  BP Response: Normal blood pressure response during stress  Stress Termination: Planned / protocol completed  Stress Symptoms: No CP reported.    ECG Negative  ECG Interpretation: Heart rate is N/A   IMAGE PROTOCOLRest/Stress 1 Day  Modality: PET- Discovery MI  Radiopharmaceutical Dose (mCi) Imaging Date Inj Time Img Time Rescan Reason Rescan Time  Rest: RB-82 Rubidium, IV 35.08 01/16/2021 1400 1400  Stress: RB-82 Rubidium, IV 34.93 01/16/2021 1415 1415  Rest Administration Site: Left antecubital fossa Tech Administering Rest Dose: Alessandra Grout, CNMT  Stress Administration Site: Left antecubital fossa Tech Administering Stress Dose: Bijal Shah, CNMT   FUNCTIONAL RESULTS (calculated via Gated SPECT)  Post Stress Image LV EF: 64 %  Stress EDV: 63 ml  EDVI: 26 ml/m TID:  Stress ESV: 23 ml ESVI: 9 ml/m  Rest Image LV EF: 60 %  Rest Image LV EF:  Rest EDV: 63 ml EDVI: 26 ml/m  Rest ESV: 25 ml ESVI: 10 ml/m      Wall Motion  Wall Thickening  Anterior: Normal Normal  Apex: Normal Normal  Inferior: Normal Normal  Lateral: Normal Normal  Septal: Normal Normal   LV Ejection Fraction & Size: Normal left ventricular size and ejection fraction.    LV Function & Wall Thickening: Normal left ventricular wall motion and thickening.    FINAL COMMENTS  Cardiac PET with concurrent CT was performed. Per-infarct ischemia and ischemia in the inferior wall.   Assessment and Plan:  1.  Multivessel CAD status post CABG in 2016.  He reports no active angina symptoms and underwent a PET myocardial perfusion study at [facility] in February showing evidence of inferior wall scar with peri-infarct ischemia.  LVEF normal by echocardiogram.  Continue aspirin and Pravachol.  2.  Cirrhosis with portal hypertension, currently on the liver transplant list at [facility] with regular follow-up in place.  3.  CKD stage IIIb, last creatinine 1.66 in late February.  He follows with Dr. Theador Hawthorne.  4.  Fluctuating volume overload in the setting of chronic diastolic heart failure, cirrhosis, and CKD stage IIIb.  Current diuretics include Demadex 20 mg daily and Aldactone 50 mg daily.  5.  Mild aortic stenosis,  asymptomatic.  Medication Adjustments/Labs and Tests Ordered: Current medicines are reviewed at length with the patient today.  Concerns regarding medicines are outlined above.   Tests Ordered: No orders of the defined types were placed in this encounter.   Medication Changes: No orders of the defined types were placed in this encounter.   Disposition:  Follow up 3 months in the [place] office.  Signed, Satira Sark, MD, [facility] 02/27/2021 3:11 PM    [facility] at [facility] [address] [place], [place] [address] Phone: [phone]; Fax: [phone]

## 2021-02-27 ENCOUNTER — Encounter: Payer: Self-pay | Admitting: Cardiology

## 2021-02-27 ENCOUNTER — Other Ambulatory Visit: Payer: Self-pay

## 2021-02-27 ENCOUNTER — Ambulatory Visit (INDEPENDENT_AMBULATORY_CARE_PROVIDER_SITE_OTHER): Payer: Federal, State, Local not specified - PPO | Admitting: Cardiology

## 2021-02-27 VITALS — BP 106/66 | HR 100 | Ht 70.0 in | Wt 264.0 lb

## 2021-02-27 DIAGNOSIS — I350 Nonrheumatic aortic (valve) stenosis: Secondary | ICD-10-CM

## 2021-02-27 DIAGNOSIS — I25810 Atherosclerosis of coronary artery bypass graft(s) without angina pectoris: Secondary | ICD-10-CM

## 2021-02-27 DIAGNOSIS — I25119 Atherosclerotic heart disease of native coronary artery with unspecified angina pectoris: Secondary | ICD-10-CM | POA: Diagnosis not present

## 2021-02-27 DIAGNOSIS — N1832 Chronic kidney disease, stage 3b: Secondary | ICD-10-CM | POA: Diagnosis not present

## 2021-02-27 NOTE — Patient Instructions (Signed)
Medication Instructions:  Your physician recommends that you continue on your current medications as directed. Please refer to the Current Medication list given to you today.  *If you need a refill on your cardiac medications before your next appointment, please call your pharmacy*   Lab Work: None today If you have labs (blood work) drawn today and your tests are completely normal, you will receive your results only by: . MyChart Message (if you have MyChart) OR . A paper copy in the mail If you have any lab test that is abnormal or we need to change your treatment, we will call you to review the results.   Testing/Procedures: None today   Follow-Up: At [facility], you and your health needs are our priority.  As part of our continuing mission to provide you with exceptional heart care, we have created designated Provider Care Teams.  These Care Teams include your primary Cardiologist (physician) and Advanced Practice Providers (APPs -  Physician Assistants and Nurse Practitioners) who all work together to provide you with the care you need, when you need it.  We recommend signing up for the patient portal called "MyChart".  Sign up information is provided on this After Visit Summary.  MyChart is used to connect with patients for Virtual Visits (Telemedicine).  Patients are able to view lab/test results, encounter notes, upcoming appointments, etc.  Non-urgent messages can be sent to your provider as well.   To learn more about what you can do with MyChart, go to [email or web address].    Your next appointment:   3 month(s)  The format for your next appointment:   In Person  Provider:   Samuel McDowell, MD   Other Instructions None      Thank you for choosing  [facility] !         

## 2021-03-02 ENCOUNTER — Other Ambulatory Visit: Payer: Self-pay

## 2021-03-02 ENCOUNTER — Ambulatory Visit (HOSPITAL_COMMUNITY): Payer: Federal, State, Local not specified - PPO | Admitting: Physical Therapy

## 2021-03-02 ENCOUNTER — Encounter (HOSPITAL_COMMUNITY): Payer: Self-pay | Admitting: Physical Therapy

## 2021-03-02 DIAGNOSIS — M6281 Muscle weakness (generalized): Secondary | ICD-10-CM

## 2021-03-02 DIAGNOSIS — R2681 Unsteadiness on feet: Secondary | ICD-10-CM

## 2021-03-02 NOTE — Therapy (Signed)
[facility] [place] [address] [place], [place], [address] Phone: [phone]   Fax:  [phone]  Physical Therapy Treatment and Discharge note   Patient Details  Name: [name] MRN: [id number] Date of Birth: May 16, 1961 Referring Provider (PT): Dr. [name]   PHYSICAL THERAPY DISCHARGE SUMMARY  Visits from Start of Care:11  Current functional level related to goals / functional outcomes: See below   Remaining deficits: Continued weakness   Education / Equipment: See  below Plan: Patient agrees to discharge.  Patient goals were partially met. Patient is being discharged due to meeting the stated rehab goals.  ?????            Encounter Date: 03/02/2021   PT End of Session - 03/02/21 0751    Visit Number 11    Number of Visits 12    Date for PT Re-Evaluation 03/03/21    Authorization Type [employer]/Federal employee, no auth, no VL    Progress Note Due on Visit 16    PT Start Time 0750   pt late to session   PT Stop Time 0815    PT Time Calculation (min) 25 min    Activity Tolerance Patient tolerated treatment well;Patient limited by fatigue    Behavior During Therapy [facility] for tasks assessed/performed           Past Medical History:  Diagnosis Date  . Anemia   . Arthritis   . CAD (coronary artery disease)    Multivessel disease status post CABG 08/2015  . Cirrhosis ([place])   . CKD (chronic kidney disease) stage 3, GFR 30-59 ml/min (HCC)   . Diastolic CHF ([place])   . Essential hypertension   . Hyperlipidemia   . OSA on CPAP   . Polyclonal gammopathy   . Thrombocytopenia ([place]) 2016  . Type 2 diabetes mellitus ([place])     Past Surgical History:  Procedure Laterality Date  . APPENDECTOMY    . Biceps tendon surgery Right   . BIOPSY  11/22/2019   Procedure: BIOPSY;  Surgeon: Daneil Dolin, MD;  [facility];  Service: Endoscopy;;  . CARDIAC CATHETERIZATION N/A 08/25/2015   Procedure: Left Heart  Cath and Coronary Angiography;  Surgeon: Belva Crome, MD;  Location: [facility];  Service: Cardiovascular;  Laterality: N/A;  . COLONOSCOPY WITH PROPOFOL N/A 11/22/2019   Procedure: COLONOSCOPY WITH PROPOFOL;  Surgeon: Daneil Dolin, MD; Four 4-5 mm polyps, findings suggestive of portal colopathy, congested appearing colonic mucosa diffusely, rectal varices, and adequate right colon prep.  Pathology with tubular adenomas and hyperplastic polyp.  Right colon biopsy with focal active colitis.  Recommendations to repeat colonoscopy in 3 months due to poor prep.  . COLONOSCOPY WITH PROPOFOL N/A 03/17/2020   Procedure: COLONOSCOPY WITH PROPOFOL;  Surgeon: Daneil Dolin, MD;  [facility];  Service: Endoscopy;  Laterality: N/A;  8:45am - pt does not need covid test, was + 2/4 <90 days  . CORONARY ARTERY BYPASS GRAFT N/A 08/29/2015   Procedure: CORONARY ARTERY BYPASS GRAFTING (CABG);  Surgeon: [name], MD;  Location: [facility];  Service: Open Heart Surgery;  Laterality: N/A;  . ESOPHAGOGASTRODUODENOSCOPY (EGD) WITH PROPOFOL N/A 11/22/2019   Procedure: ESOPHAGOGASTRODUODENOSCOPY (EGD) WITH PROPOFOL;  Surgeon: Daneil Dolin, MD; 4 columns of grade 2-3 esophageal varices, portal gastropathy, gastric polyp/abnormal gastric mucosa s/p biopsy.  Pathology with hyperplastic polyp, mild chronic gastritis, negative H. pylori.  [name] KNEE ARTHROSCOPY Left   .  TEE WITHOUT CARDIOVERSION N/A 08/29/2015   Procedure: TRANSESOPHAGEAL ECHOCARDIOGRAM (TEE);  Surgeon: [name], MD;  Location: [facility];  Service: Open Heart Surgery;  Laterality: N/A;  . TOTAL KNEE ARTHROPLASTY Left 03/09/2017   Procedure: TOTAL KNEE ARTHROPLASTY;  Surgeon: Carole Civil, MD;  Location: [facility];  Service: Orthopedics;  Laterality: Left;    There were no vitals filed for this visit.   Subjective Assessment - 03/02/21 0750    Subjective States that this right knee is painful 6/10 and described as dull  achy. Reports he had rough weekend as he was very busy. States overall he feels 75-80% better. States that when he starts getting up to walk he feels like he might fall states he has a cane but forgets it.    Currently in Pain? Yes    Pain Score 6     Pain Location Knee    Pain Orientation Right    Pain Descriptors / Indicators Aching;Dull    Pain Onset More than a month ago              [facility] PT Assessment - 03/02/21 0001      Assessment   Medical Diagnosis Hypotension    Referring Provider (PT) Dr. [name]      Transfers   Five time sit to stand comments  13.79 from standard chair height      Ambulation/Gait   Ambulation/Gait Yes    Ambulation/Gait Assistance 5: Supervision    Ambulation Distance (Feet) 330 Feet    Assistive device None    Gait Pattern Decreased stance time - right;Decreased stride length    Ambulation Surface Level;Indoor    Gait velocity decreased    Stairs Yes    Stairs Assistance 6: Modified independent (Device/Increase time)    Stair Management Technique One rail Right;Alternating pattern    Number of Stairs 4    Height of Stairs 7    Gait Comments decreased control with decent on steps                                 PT Education - 03/02/21 0810    Education Details reviewed HEP, answered all questions, focus moving forward, goals met and plan moving forward.    Person(s) Educated Patient    Methods Explanation    Comprehension Verbalized understanding            PT Short Term Goals - 03/02/21 0759      PT SHORT TERM GOAL #1   Title Patient will be independent with HEP in order to improve functional outcomes.    Baseline 3/21 - 4 times a week    Status Achieved      PT SHORT TERM GOAL #2   Title Patient will manifest low risk for falls per time of 12 sec 5 Times Sit to Stand Test    Baseline 03/02/21: 5 STS no HHA 13.79" standard chair height;  inital eval was 17 sec 20" seat height    Status On-going       PT SHORT TERM GOAL #3   Title Patient will demo improved ambulation as evidenced by distance of 300 ft during 2MWT without need for rest period    Baseline 02/03/21: 2MWT 340f initial eval was 200 ft with LOB and 4/10 RPE    Status Achieved             PT Long Term  Goals - 03/02/21 0802      PT LONG TERM GOAL #1   Title Patient will be able to navigate stairs with reciprocal pattern without compensation in order to demonstrate improved LE strength.    Baseline 3/21 able to perform with decreased control on right with descent on steps but this is due to right knee pain as paitent awaiting right knee replacment.    Status On-going      PT LONG TERM GOAL #2   Title Patient will demonstrate improved ambulation tolerance as evidenced by distance of 350 ft during 2MWT    Baseline 02/03/21: 2MWT 338f initial eval was 200 ft with LOB and 4/10 RPE    Status Achieved                 Plan - 03/02/21 0751    Clinical Impression Statement Patient present for a progress note on this date overall patient has made good progress. Continues to demonstrate difficulty descending stairs but this is secondary to pain in right knee as patient is awaiting right knee replacement. Removed progress and answered all questions. Patient to discharge from PT to HEP at this time secondary to progress made.    Personal Factors and Comorbidities Age;Comorbidity 1;Time since onset of injury/illness/exacerbation    Comorbidities PMH, liver    Examination-Activity Limitations Bed Mobility;Bend;Carry;Lift;Stand;Stairs;Squat;Locomotion Level;Transfers    Examination-Participation Restrictions Community Activity;Yard Work;Meal Prep    Stability/Clinical Decision Making Stable/Uncomplicated    Rehab Potential Good    PT Frequency 2x / week    PT Duration 6 weeks    PT Treatment/Interventions ADLs/Self Care Home Management;Aquatic Therapy;Electrical Stimulation;DME Instruction;Gait training;Stair  training;Functional mobility training;Therapeutic activities;Therapeutic exercise;Balance training;Patient/family education;Neuromuscular re-education;Manual techniques;Dry needling;Energy conservation;Spinal Manipulations;Joint Manipulations    PT Next Visit Plan DC to HEP    PT Home Exercise Plan SLR, hip add, 1/18: bridge, STS; 1/27: heel/toe raise and sidelying abd; 2/2: tandem stance and SLS by counter, bike and leg press.           Patient will benefit from skilled therapeutic intervention in order to improve the following deficits and impairments:  Abnormal gait,Decreased activity tolerance,Decreased balance,Decreased mobility,Decreased knowledge of use of DME,Decreased endurance,Decreased strength,Difficulty walking,Impaired perceived functional ability,Improper body mechanics,Obesity  Visit Diagnosis: Unsteadiness on feet  Muscle weakness (generalized)     Problem List Patient Active Problem List   Diagnosis Date Noted  . Hypertensive heart and kidney disease with heart failure and with chronic kidney disease stage III ([place] 12/17/2020  . Chronic diastolic heart failure ([place] 12/17/2020  . Type 2 diabetes mellitus with stage 3b chronic kidney disease and hypertension ([place] 12/17/2020  . CKD stage 3 due to type 2 diabetes mellitus ([place] 12/17/2020  . Hyperlipidemia associated with type 2 diabetes mellitus ([place] 12/17/2020  . Anxiety 12/17/2020  . Chronic non-seasonal allergic rhinitis 12/17/2020  . Hypotension 12/10/2020  . Acute renal failure superimposed on stage 3b chronic kidney disease ([place] 12/10/2020  . Acute renal failure superimposed on stage 3b chronic kidney disease, unspecified acute renal failure type ([place] 12/10/2020  . AKI (acute kidney injury) ([place] 12/09/2020  . GERD (gastroesophageal reflux disease) 10/09/2020  . Anasarca   . Cirrhosis of liver ([place]   . Acute on chronic heart failure with preserved ejection fraction ([place]   . Liver failure ([facility]  06/20/2020  . Esophageal varices ([place] 01/09/2020  . History of adenomatous polyp of colon 01/09/2020  . Elevated AST (SGOT) 08/23/2019  . Diarrhea 07/17/2019  . Hypersomnia, persistent 03/12/2019  .  Macrocytic anemia 01/18/2019  . Other pancytopenia ([place]) 01/18/2019  . Thrombocytopenia ([place]) 12/30/2018  . Polyclonal gammopathy 12/30/2018  . S/P total knee replacement, left 03/09/17 11/22/2017  . Primary osteoarthritis of left knee 03/09/2017  . History of nocturia 03/02/2017  . OSA on CPAP 03/02/2016  . Hypoxia, sleep related 03/02/2016  . Cyst of mediastinum 01/15/2016  . S/P CABG x 4 08/29/2015  . CAD (coronary artery disease)   . Obesity, Class III, BMI 40-49.9 (morbid obesity) ([place])   . Hyperglycemia   . Pain in the chest 08/23/2015  . Non-ST elevation MI (NSTEMI) ([place]) 08/23/2015  . Diabetes mellitus type 2, uncontrolled ([place]) 08/23/2015  . Hypertension 08/23/2015  . Hyperlipidemia 08/23/2015  . Type 2 diabetes mellitus with hyperglycemia ([place]) 2016  . Class 2 obesity 2016  . Hyponatremia 2016  . Osteoarthritis, knee 05/24/2012  . Knee pain 10/29/2011  . Knee stiffness 10/29/2011  . S/P right knee arthroscopy 10/26/2011  . Medial meniscus, posterior horn derangement 09/07/2011  . Lateral meniscus derangement 09/07/2011  . OA (osteoarthritis) of knee 09/07/2011  . Rotator cuff syndrome of left shoulder 08/19/2011  . Right knee meniscal tear 08/19/2011    8:20 AM, 03/02/21 Jerene Pitch, DPT Physical Therapy with [facility]  [phone] office  [facility] [address] [place], [place], [address] Phone: [phone]   Fax:  [phone]  Name: [name] MRN: [id number] Date of Birth: [birth year]

## 2021-03-03 DIAGNOSIS — Z7682 Awaiting organ transplant status: Secondary | ICD-10-CM | POA: Diagnosis not present

## 2021-03-03 DIAGNOSIS — F419 Anxiety disorder, unspecified: Secondary | ICD-10-CM | POA: Diagnosis not present

## 2021-03-03 DIAGNOSIS — F32A Depression, unspecified: Secondary | ICD-10-CM | POA: Diagnosis not present

## 2021-03-04 DIAGNOSIS — D696 Thrombocytopenia, unspecified: Secondary | ICD-10-CM | POA: Diagnosis not present

## 2021-03-04 DIAGNOSIS — E1165 Type 2 diabetes mellitus with hyperglycemia: Secondary | ICD-10-CM | POA: Diagnosis not present

## 2021-03-04 DIAGNOSIS — E785 Hyperlipidemia, unspecified: Secondary | ICD-10-CM | POA: Diagnosis not present

## 2021-03-04 DIAGNOSIS — G4733 Obstructive sleep apnea (adult) (pediatric): Secondary | ICD-10-CM | POA: Diagnosis not present

## 2021-03-04 DIAGNOSIS — Z79899 Other long term (current) drug therapy: Secondary | ICD-10-CM | POA: Diagnosis not present

## 2021-03-04 DIAGNOSIS — Z23 Encounter for immunization: Secondary | ICD-10-CM | POA: Diagnosis not present

## 2021-03-04 DIAGNOSIS — R188 Other ascites: Secondary | ICD-10-CM | POA: Diagnosis not present

## 2021-03-04 DIAGNOSIS — I8500 Esophageal varices without bleeding: Secondary | ICD-10-CM | POA: Diagnosis not present

## 2021-03-04 DIAGNOSIS — K7460 Unspecified cirrhosis of liver: Secondary | ICD-10-CM | POA: Diagnosis not present

## 2021-03-04 DIAGNOSIS — I10 Essential (primary) hypertension: Secondary | ICD-10-CM | POA: Diagnosis not present

## 2021-03-04 DIAGNOSIS — K766 Portal hypertension: Secondary | ICD-10-CM | POA: Diagnosis not present

## 2021-03-04 DIAGNOSIS — E119 Type 2 diabetes mellitus without complications: Secondary | ICD-10-CM | POA: Diagnosis not present

## 2021-03-04 DIAGNOSIS — Z01818 Encounter for other preprocedural examination: Secondary | ICD-10-CM | POA: Diagnosis not present

## 2021-03-04 DIAGNOSIS — Z6841 Body Mass Index (BMI) 40.0 and over, adult: Secondary | ICD-10-CM | POA: Diagnosis not present

## 2021-03-04 DIAGNOSIS — K7210 Chronic hepatic failure without coma: Secondary | ICD-10-CM | POA: Diagnosis not present

## 2021-03-04 DIAGNOSIS — D509 Iron deficiency anemia, unspecified: Secondary | ICD-10-CM | POA: Diagnosis not present

## 2021-03-12 ENCOUNTER — Telehealth: Payer: Self-pay | Admitting: Cardiology

## 2021-03-12 NOTE — Telephone Encounter (Signed)
Pt c/o swelling: STAT is pt has developed SOB within 24 hours  1) How much weight have you gained and in what time span?  5lbs in 2 days  2) If swelling, where is the swelling located? In his legs   3) Are you currently taking a fluid pill?  yes  4) Are you currently SOB? A little bit   5) Do you have a log of your daily weights (if so, list)?  Yes  Today 260 couple days ago 255   6) Have you gained 3 pounds in a day or 5 pounds in a week? 5lbs   7) Have you traveled recently? no

## 2021-03-12 NOTE — Telephone Encounter (Signed)
I spoke with patient. He agrees to take torsemide 40 mg today and tomorrow and call us back on Monday 03/16/21 with update.

## 2021-03-12 NOTE — Telephone Encounter (Signed)
He can double the Demadex for 2 days and then resume prior dose.

## 2021-03-12 NOTE — Telephone Encounter (Signed)
Patient does not weigh self daily. Last office visit on 02/27/21 his weight was 264 lbs. His weight today is 260 lbs although he states his weight earlier in the week was 255 lbs. Torsemide is 20 mg daily    Please advise.

## 2021-03-16 ENCOUNTER — Inpatient Hospital Stay (HOSPITAL_COMMUNITY)
Admission: EM | Admit: 2021-03-16 | Discharge: 2021-03-24 | DRG: 682 | Disposition: A | Discharge status: Home Health | Payer: Federal, State, Local not specified - PPO | Attending: Internal Medicine | Admitting: Internal Medicine

## 2021-03-16 ENCOUNTER — Encounter (HOSPITAL_COMMUNITY): Payer: Self-pay | Admitting: Emergency Medicine

## 2021-03-16 ENCOUNTER — Emergency Department (HOSPITAL_COMMUNITY): Payer: Federal, State, Local not specified - PPO

## 2021-03-16 ENCOUNTER — Other Ambulatory Visit: Payer: Self-pay

## 2021-03-16 DIAGNOSIS — N1832 Chronic kidney disease, stage 3b: Secondary | ICD-10-CM | POA: Diagnosis present

## 2021-03-16 DIAGNOSIS — Z20822 Contact with and (suspected) exposure to covid-19: Secondary | ICD-10-CM | POA: Diagnosis present

## 2021-03-16 DIAGNOSIS — K219 Gastro-esophageal reflux disease without esophagitis: Secondary | ICD-10-CM | POA: Diagnosis present

## 2021-03-16 DIAGNOSIS — K8020 Calculus of gallbladder without cholecystitis without obstruction: Secondary | ICD-10-CM | POA: Diagnosis not present

## 2021-03-16 DIAGNOSIS — Z800 Family history of malignant neoplasm of digestive organs: Secondary | ICD-10-CM | POA: Diagnosis not present

## 2021-03-16 DIAGNOSIS — Z96652 Presence of left artificial knee joint: Secondary | ICD-10-CM | POA: Diagnosis present

## 2021-03-16 DIAGNOSIS — Z794 Long term (current) use of insulin: Secondary | ICD-10-CM | POA: Diagnosis not present

## 2021-03-16 DIAGNOSIS — M549 Dorsalgia, unspecified: Secondary | ICD-10-CM

## 2021-03-16 DIAGNOSIS — K8689 Other specified diseases of pancreas: Secondary | ICD-10-CM | POA: Diagnosis not present

## 2021-03-16 DIAGNOSIS — K767 Hepatorenal syndrome: Secondary | ICD-10-CM | POA: Diagnosis present

## 2021-03-16 DIAGNOSIS — K7460 Unspecified cirrhosis of liver: Secondary | ICD-10-CM | POA: Diagnosis not present

## 2021-03-16 DIAGNOSIS — Z6841 Body Mass Index (BMI) 40.0 and over, adult: Secondary | ICD-10-CM | POA: Diagnosis not present

## 2021-03-16 DIAGNOSIS — Z7682 Awaiting organ transplant status: Secondary | ICD-10-CM | POA: Diagnosis not present

## 2021-03-16 DIAGNOSIS — E1165 Type 2 diabetes mellitus with hyperglycemia: Secondary | ICD-10-CM | POA: Diagnosis present

## 2021-03-16 DIAGNOSIS — Z8249 Family history of ischemic heart disease and other diseases of the circulatory system: Secondary | ICD-10-CM

## 2021-03-16 DIAGNOSIS — E785 Hyperlipidemia, unspecified: Secondary | ICD-10-CM | POA: Diagnosis not present

## 2021-03-16 DIAGNOSIS — Z79899 Other long term (current) drug therapy: Secondary | ICD-10-CM | POA: Diagnosis not present

## 2021-03-16 DIAGNOSIS — E871 Hypo-osmolality and hyponatremia: Secondary | ICD-10-CM | POA: Diagnosis present

## 2021-03-16 DIAGNOSIS — G4733 Obstructive sleep apnea (adult) (pediatric): Secondary | ICD-10-CM | POA: Diagnosis present

## 2021-03-16 DIAGNOSIS — I959 Hypotension, unspecified: Secondary | ICD-10-CM | POA: Diagnosis not present

## 2021-03-16 DIAGNOSIS — N179 Acute kidney failure, unspecified: Secondary | ICD-10-CM | POA: Diagnosis not present

## 2021-03-16 DIAGNOSIS — K7689 Other specified diseases of liver: Secondary | ICD-10-CM | POA: Diagnosis not present

## 2021-03-16 DIAGNOSIS — K859 Acute pancreatitis without necrosis or infection, unspecified: Secondary | ICD-10-CM | POA: Diagnosis present

## 2021-03-16 DIAGNOSIS — E1122 Type 2 diabetes mellitus with diabetic chronic kidney disease: Secondary | ICD-10-CM | POA: Diagnosis not present

## 2021-03-16 DIAGNOSIS — F419 Anxiety disorder, unspecified: Secondary | ICD-10-CM | POA: Diagnosis present

## 2021-03-16 DIAGNOSIS — E2740 Unspecified adrenocortical insufficiency: Secondary | ICD-10-CM | POA: Diagnosis present

## 2021-03-16 DIAGNOSIS — I2510 Atherosclerotic heart disease of native coronary artery without angina pectoris: Secondary | ICD-10-CM | POA: Diagnosis not present

## 2021-03-16 DIAGNOSIS — Z833 Family history of diabetes mellitus: Secondary | ICD-10-CM

## 2021-03-16 DIAGNOSIS — I130 Hypertensive heart and chronic kidney disease with heart failure and stage 1 through stage 4 chronic kidney disease, or unspecified chronic kidney disease: Secondary | ICD-10-CM | POA: Diagnosis not present

## 2021-03-16 DIAGNOSIS — M542 Cervicalgia: Secondary | ICD-10-CM | POA: Diagnosis not present

## 2021-03-16 DIAGNOSIS — I10 Essential (primary) hypertension: Secondary | ICD-10-CM | POA: Diagnosis not present

## 2021-03-16 DIAGNOSIS — I5032 Chronic diastolic (congestive) heart failure: Secondary | ICD-10-CM | POA: Diagnosis present

## 2021-03-16 DIAGNOSIS — Z807 Family history of other malignant neoplasms of lymphoid, hematopoietic and related tissues: Secondary | ICD-10-CM | POA: Diagnosis not present

## 2021-03-16 DIAGNOSIS — E11649 Type 2 diabetes mellitus with hypoglycemia without coma: Secondary | ICD-10-CM | POA: Diagnosis present

## 2021-03-16 DIAGNOSIS — Z7982 Long term (current) use of aspirin: Secondary | ICD-10-CM

## 2021-03-16 DIAGNOSIS — E875 Hyperkalemia: Secondary | ICD-10-CM

## 2021-03-16 DIAGNOSIS — Z951 Presence of aortocoronary bypass graft: Secondary | ICD-10-CM

## 2021-03-16 DIAGNOSIS — Z6837 Body mass index (BMI) 37.0-37.9, adult: Secondary | ICD-10-CM

## 2021-03-16 DIAGNOSIS — Z888 Allergy status to other drugs, medicaments and biological substances status: Secondary | ICD-10-CM

## 2021-03-16 DIAGNOSIS — K7581 Nonalcoholic steatohepatitis (NASH): Secondary | ICD-10-CM

## 2021-03-16 DIAGNOSIS — Z452 Encounter for adjustment and management of vascular access device: Secondary | ICD-10-CM

## 2021-03-16 DIAGNOSIS — R778 Other specified abnormalities of plasma proteins: Secondary | ICD-10-CM

## 2021-03-16 DIAGNOSIS — R5381 Other malaise: Secondary | ICD-10-CM | POA: Diagnosis not present

## 2021-03-16 DIAGNOSIS — J811 Chronic pulmonary edema: Secondary | ICD-10-CM | POA: Diagnosis not present

## 2021-03-16 DIAGNOSIS — K7469 Other cirrhosis of liver: Secondary | ICD-10-CM | POA: Diagnosis not present

## 2021-03-16 DIAGNOSIS — M545 Low back pain, unspecified: Secondary | ICD-10-CM | POA: Diagnosis not present

## 2021-03-16 LAB — PROTIME-INR
INR: 1.1 (ref 0.8–1.2)
Prothrombin Time: 14.2 seconds (ref 11.4–15.2)

## 2021-03-16 MED ORDER — SODIUM CHLORIDE 0.9 % IV BOLUS
1000.0000 mL | Freq: Once | INTRAVENOUS | Status: AC
  Administered 2021-03-16: 1000 mL via INTRAVENOUS

## 2021-03-16 MED ORDER — FENTANYL CITRATE (PF) 100 MCG/2ML IJ SOLN
50.0000 ug | Freq: Once | INTRAMUSCULAR | Status: AC
  Administered 2021-03-16: 50 ug via INTRAVENOUS
  Filled 2021-03-16: qty 2

## 2021-03-16 NOTE — ED Provider Notes (Signed)
[facility] EMERGENCY DEPARTMENT Provider Note   CSN: [id number] Arrival date & time: 03/16/21  2129     History No chief complaint on file.   [name] is a 60 y.o. male.  Patient is a 60 year old male with past medical history of coronary artery disease with CABG in 2016, chronic renal insufficiency, nonalcoholic cirrhosis on transplant list, type 2 diabetes.  Patient presents today for evaluation of back and neck pain.  This began earlier today in the absence of any injury or trauma.  He denies any weakness or numbness in his extremities.  He denies any bowel or bladder issues.  His pain is worse when he attempts to ambulate and sit forward.  He denies alleviating factors.  The history is provided by the patient.       Past Medical History:  Diagnosis Date  . Anemia   . Arthritis   . CAD (coronary artery disease)    Multivessel disease status post CABG 08/2015  . Cirrhosis ([place])   . CKD (chronic kidney disease) stage 3, GFR 30-59 ml/min (HCC)   . Diastolic CHF ([place])   . Essential hypertension   . Hyperlipidemia   . OSA on CPAP   . Polyclonal gammopathy   . Thrombocytopenia ([place]) 2016  . Type 2 diabetes mellitus [facility])     Patient Active Problem List   Diagnosis Date Noted  . Hypertensive heart and kidney disease with heart failure and with chronic kidney disease stage III ([place]) 12/17/2020  . Chronic diastolic heart failure ([place]) 12/17/2020  . Type 2 diabetes mellitus with stage 3b chronic kidney disease and hypertension ([place]) 12/17/2020  . CKD stage 3 due to type 2 diabetes mellitus ([place]) 12/17/2020  . Hyperlipidemia associated with type 2 diabetes mellitus ([place]) 12/17/2020  . Anxiety 12/17/2020  . Chronic non-seasonal allergic rhinitis 12/17/2020  . Hypotension 12/10/2020  . Acute renal failure superimposed on stage 3b chronic kidney disease ([place]) 12/10/2020  . Acute renal failure superimposed on stage 3b chronic kidney disease, unspecified acute renal  failure type ([place]) 12/10/2020  . AKI (acute kidney injury) ([place]) 12/09/2020  . GERD (gastroesophageal reflux disease) 10/09/2020  . Anasarca   . Cirrhosis of liver ([place])   . Acute on chronic heart failure with preserved ejection fraction ([place])   . Liver failure ([place]) 06/20/2020  . Esophageal varices ([place]) 01/09/2020  . History of adenomatous polyp of colon 01/09/2020  . Elevated AST (SGOT) 08/23/2019  . Diarrhea 07/17/2019  . Hypersomnia, persistent 03/12/2019  . Macrocytic anemia 01/18/2019  . Other pancytopenia ([place]) 01/18/2019  . Thrombocytopenia ([place]) 12/30/2018  . Polyclonal gammopathy 12/30/2018  . S/P total knee replacement, left 03/09/17 11/22/2017  . Primary osteoarthritis of left knee 03/09/2017  . History of nocturia 03/02/2017  . OSA on CPAP 03/02/2016  . Hypoxia, sleep related 03/02/2016  . Cyst of mediastinum 01/15/2016  . S/P CABG x 4 08/29/2015  . CAD (coronary artery disease)   . Obesity, Class III, BMI 40-49.9 (morbid obesity) ([place])   . Hyperglycemia   . Pain in the chest 08/23/2015  . Non-ST elevation MI (NSTEMI) ([place]) 08/23/2015  . Diabetes mellitus type 2, uncontrolled ([place]) 08/23/2015  . Hypertension 08/23/2015  . Hyperlipidemia 08/23/2015  . Type 2 diabetes mellitus with hyperglycemia ([place]) 2016  . Class 2 obesity 2016  . Hyponatremia 2016  . Osteoarthritis, knee 05/24/2012  . Knee pain 10/29/2011  . Knee stiffness 10/29/2011  . S/P right knee arthroscopy 10/26/2011  . Medial meniscus, posterior horn  derangement 09/07/2011  . Lateral meniscus derangement 09/07/2011  . OA (osteoarthritis) of knee 09/07/2011  . Rotator cuff syndrome of left shoulder 08/19/2011  . Right knee meniscal tear 08/19/2011    Past Surgical History:  Procedure Laterality Date  . APPENDECTOMY    . Biceps tendon surgery Right   . BIOPSY  11/22/2019   Procedure: BIOPSY;  Surgeon: Daneil Dolin, MD;  Location[facility];  Service: Endoscopy;;  . CARDIAC CATHETERIZATION  N/A 08/25/2015   Procedure: Left Heart Cath and Coronary Angiography;  Surgeon: Belva Crome, MD;  Location: [place] CV LAB;  Service: Cardiovascular;  Laterality: N/A;  . COLONOSCOPY WITH PROPOFOL N/A 11/22/2019   Procedure: COLONOSCOPY WITH PROPOFOL;  Surgeon: Daneil Dolin, MD; Four 4-5 mm polyps, findings suggestive of portal colopathy, congested appearing colonic mucosa diffusely, rectal varices, and adequate right colon prep.  Pathology with tubular adenomas and hyperplastic polyp.  Right colon biopsy with focal active colitis.  Recommendations to repeat colonoscopy in 3 months due to poor prep.  . COLONOSCOPY WITH PROPOFOL N/A 03/17/2020   Procedure: COLONOSCOPY WITH PROPOFOL;  Surgeon: Daneil Dolin, MD;  Location: [facility] END[facility];  Service: Endoscopy;  Laterality: N/A;  8:45am - pt does not need covid test, was + 2/4 <90 days  . CORONARY ARTERY BYPASS GRAFT N/A 08/29/2015   Procedure: CORONARY ARTERY BYPASS GRAFTING (CABG);  Surgeon: [name], MD;  Location: [place];  Service: Open Heart Surgery;  Laterality: N/A;  . ESOPHAGOGASTRODUODENOSCOPY (EGD) WITH PROPOFOL N/A 11/22/2019   Procedure: ESOPHAGOGASTRODUODENOSCOPY (EGD) WITH PROPOFOL;  Surgeon: Daneil Dolin, MD; 4 columns of grade 2-3 esophageal varices, portal gastropathy, gastric polyp/abnormal gastric mucosa s/p biopsy.  Pathology with hyperplastic polyp, mild chronic gastritis, negative H. pylori.  [name] KNEE ARTHROSCOPY Left   . TEE WITHOUT CARDIOVERSION N/A 08/29/2015   Procedure: TRANSESOPHAGEAL ECHOCARDIOGRAM (TEE);  Surgeon: [name], MD;  Location: [place];  Service: Open Heart Surgery;  Laterality: N/A;  . TOTAL KNEE ARTHROPLASTY Left 03/09/2017   Procedure: TOTAL KNEE ARTHROPLASTY;  Surgeon: Carole Civil, MD;  Location: [facility] ORS;  Service: Orthopedics;  Laterality: Left;       Family History  Problem Relation Age of Onset  . Arthritis Other   . Cancer Other   . Diabetes Other   . CAD Father   .  Diabetes Mellitus II Father   . Liver cancer Father   . Hodgkin's lymphoma Father   . CAD Brother   . Diabetes Mellitus II Brother   . ALS Mother   . Diabetes Mellitus II Sister   . Diabetes Mellitus II Brother   . Diabetes Mellitus II Maternal Grandmother   . Aneurysm Maternal Grandmother   . Cancer Maternal Grandfather   . Anesthesia problems Neg Hx   . Hypotension Neg Hx   . Malignant hyperthermia Neg Hx   . Pseudochol deficiency Neg Hx   . Colon cancer Neg Hx     Social History   Tobacco Use  . Smoking status: Never Smoker  . Smokeless tobacco: Never Used  Vaping Use  . Vaping Use: Never used  Substance Use Topics  . Alcohol use: No    Alcohol/week: 0.0 standard drinks  . Drug use: No    Home Medications Prior to Admission medications   Medication Sig Start Date End Date Taking? Authorizing Provider  acetaminophen (TYLENOL) 325 MG tablet Take 650 mg by mouth every 6 (six) hours as needed. 12/19/20   [provider]  albuterol (VENTOLIN HFA) 108 (90 Base) MCG/ACT inhaler Inhale 2 puffs into the lungs every 6 (six) hours as needed for wheezing or shortness of breath. 12/22/20   Gerlene Fee, NP  ALPRAZolam Duanne Moron) 0.5 MG tablet Take 1 tablet (0.5 mg total) by mouth daily as needed. 12/22/20   Gerlene Fee, NP  Armodafinil 200 MG TABS Take 200 mg by mouth daily as needed (for sleepiness). 12/22/20   Gerlene Fee, NP  aspirin EC 81 MG tablet Take 81 mg by mouth at bedtime.     [provider]  Cholecalciferol (DIALYVITE VITAMIN D 5000) 125 MCG (5000 UT) capsule Take 5,000 Units by mouth in the morning.     [provider]  Continuous Blood Gluc Receiver (FREESTYLE LIBRE 14 DAY READER) DEVI 1 reader 12/22/20   Gerlene Fee, NP  Continuous Blood Gluc Sensor (FREESTYLE LIBRE 14 DAY SENSOR) MISC 1 sensor 12/28/17   [provider]  Elastic Bandages & Supports ([place]) [place] 1 each by Does not apply route as  directed. Low pressure knee high compression stockings Dx: leg swelling 05/13/20   Satira Sark, MD  ferrous sulfate 325 (65 FE) MG tablet Take 325 mg by mouth in the morning.     [provider]  Flaxseed, Linseed, (FLAXSEED OIL) 1000 MG CAPS Take 1,000 mg by mouth at bedtime. 12/16/20   [provider]  INS SYRINGE/NEEDLE .5CC/28G 28G X 1/2" 0.5 ML MISC Inject 1 Syringe into the skin See admin instructions. 12/22/20   Gerlene Fee, NP  insulin aspart (NOVOLOG) 100 UNIT/ML injection Inject 5 Units into the skin 3 (three) times daily before meals. Special Instructions: If accu-check is greater than 150. Hold for accu-check 150 or below. With Meals 12/22/20   Gerlene Fee, NP  insulin degludec (TRESIBA FLEXTOUCH) 200 UNIT/ML FlexTouch Pen Inject 40 Units into the skin in the morning. Give 30 units Subcutaneous  at Bedtime 8:00 pm 12/22/20   Gerlene Fee, NP  lactulose (CHRONULAC) 10 GM/15ML solution Take 30 mLs (20 g total) by mouth daily. 12/22/20   Gerlene Fee, NP  loratadine (CLARITIN) 10 MG tablet Take 10 mg by mouth in the morning.     [provider]  Magnesium 250 MG TABS Take 250 mg by mouth daily.    [provider]  Misc Natural Products (ELDERBERRY ZINC/VIT C/IMMUNE) LOZG Take 1 lozenge by mouth at bedtime. 12/16/20   [provider]  NON FORMULARY Diet: _____ Regular, __x____ NAS, __x_____Consistent Carbohydrate, _______NPO _____Other 12/16/20   [provider]  omeprazole (PRILOSEC) 40 MG capsule Take 1 capsule (40 mg total) by mouth at bedtime. 12/22/20   Gerlene Fee, NP  OZEMPIC, 1 MG/DOSE, 4 MG/3ML SOPN Inject 1 mg into the skin once a week. 12/22/20   Gerlene Fee, NP  potassium chloride SA (KLOR-CON) 20 MEQ tablet Take 20 mEq by mouth 2 (two) times daily. 12/31/20   [provider]  pravastatin (PRAVACHOL) 20 MG tablet TAKE 1 TABLET(20 MG) BY MOUTH AT BEDTIME 12/22/20   Gerlene Fee, NP   spironolactone (ALDACTONE) 100 MG tablet Take 0.5 tablets (50 mg total) by mouth daily. 12/22/20   Gerlene Fee, NP  torsemide (DEMADEX) 20 MG tablet Take 1 tablet (20 mg total) by mouth daily. 01/09/21 04/09/21  Satira Sark, MD  TURMERIC CURCUMIN PO Take 2,000 mg by mouth at bedtime.     [provider]  Allergies    Fluticasone  Review of Systems   Review of Systems  All other systems reviewed and are negative.   Physical Exam Updated Vital Signs BP (!) 89/61   Pulse 65   Temp 98.7 F (37.1 C) (Oral)   Resp 18   Ht 5\' 10"  (1.778 m)   Wt 120 kg   SpO2 96%   BMI 37.96 kg/m   Physical Exam Vitals and nursing note reviewed.  Constitutional:      General: He is not in acute distress.    Appearance: He is well-developed. He is not diaphoretic.  HENT:     Head: Normocephalic and atraumatic.  Cardiovascular:     Rate and Rhythm: Normal rate and regular rhythm.     Heart sounds: No murmur heard. No friction rub.  Pulmonary:     Effort: Pulmonary effort is normal. No respiratory distress.     Breath sounds: Normal breath sounds. No wheezing or rales.  Abdominal:     General: Bowel sounds are normal. There is no distension.     Palpations: Abdomen is soft.     Tenderness: There is no abdominal tenderness.  Musculoskeletal:        General: Normal range of motion.     Cervical back: Normal range of motion and neck supple.     Comments: There is tenderness to palpation in the soft tissues of the lumbar and cervical region.  There is no bony tenderness or step-off.  Skin:    General: Skin is warm and dry.  Neurological:     Mental Status: He is alert and oriented to person, place, and time.     Coordination: Coordination normal.     Comments: Strength is 5 out of 5 in both lower extremities.  DTRs are trace and symmetrical in the patellar and Achilles tendons bilaterally.  Sensation is intact to both legs and feet.     ED Results / Procedures /  Treatments   Labs (all labs ordered are listed, but only abnormal results are displayed) Labs Reviewed  COMPREHENSIVE METABOLIC PANEL  LIPASE, BLOOD  CBC WITH DIFFERENTIAL/PLATELET  PROTIME-INR    EKG EKG Interpretation  Date/Time:  Monday March 16 2021 23:45:46 EDT Ventricular Rate:  70 PR Interval:  169 QRS Duration: 99 QT Interval:  387 QTC Calculation: 418 R Axis:   82 Text Interpretation: Sinus rhythm Borderline right axis deviation Low voltage, extremity and precordial leads Confirmed by Veryl Speak [phone]) on 03/17/2021 3:15:11 AM   Radiology No results found.  Procedures Procedures   Medications Ordered in ED Medications  sodium chloride 0.9 % bolus 1,000 mL (has no administration in time range)  fentaNYL (SUBLIMAZE) injection 50 mcg (has no administration in time range)    ED Course  I have reviewed the triage vital signs and the nursing notes.  Pertinent labs & imaging results that were available during my care of the patient were reviewed by me and considered in my medical decision making (see chart for details).    MDM Rules/Calculators/A&P  Patient is a 60 year old male with extensive past medical history as described in the HPI.  He presents with pain in his back and neck in the absence of any injury or trauma.  CT scan of the cervical spine, lumbar spine, and abdomen/pelvis were obtained showing inflammation around the pancreas which I suspect is the cause of his pain.  He is also found to have significant laboratory abnormalities including worsening renal function with creatinine of  3.9, up from 1.6.  He also has an elevated BUN and potassium of 6.6.  Patient was given D50 and insulin.  Patient also has a troponin of 244, the significance of which I am uncertain.  He is not having any chest pain or shortness of breath.  This may be related to his renal failure and will need to be trended.  Patient to be admitted to the hospitalist service for further  work-up.  He was given IV hydration here in the ER.  CRITICAL CARE Performed by: Veryl Speak Total critical care time: 35 minutes Critical care time was exclusive of separately billable procedures and treating other patients. Critical care was necessary to treat or prevent imminent or life-threatening deterioration. Critical care was time spent personally by me on the following activities: development of treatment plan with patient and/or surrogate as well as nursing, discussions with consultants, evaluation of patient's response to treatment, examination of patient, obtaining history from patient or surrogate, ordering and performing treatments and interventions, ordering and review of laboratory studies, ordering and review of radiographic studies, pulse oximetry and re-evaluation of patient's condition.   Final Clinical Impression(s) / ED Diagnoses Final diagnoses:  None    Rx / DC Orders ED Discharge Orders    None       Veryl Speak, MD 03/17/21 [phone]

## 2021-03-16 NOTE — ED Triage Notes (Signed)
Pt brought in by EMS with c/o "severe lower back and neck pain". Pt denies any injury.

## 2021-03-17 ENCOUNTER — Telehealth: Payer: Self-pay | Admitting: Cardiology

## 2021-03-17 ENCOUNTER — Encounter (HOSPITAL_COMMUNITY): Payer: Self-pay | Admitting: Family Medicine

## 2021-03-17 ENCOUNTER — Inpatient Hospital Stay (HOSPITAL_COMMUNITY): Payer: Federal, State, Local not specified - PPO

## 2021-03-17 DIAGNOSIS — K7460 Unspecified cirrhosis of liver: Secondary | ICD-10-CM | POA: Diagnosis present

## 2021-03-17 DIAGNOSIS — Z833 Family history of diabetes mellitus: Secondary | ICD-10-CM | POA: Diagnosis not present

## 2021-03-17 DIAGNOSIS — Z794 Long term (current) use of insulin: Secondary | ICD-10-CM

## 2021-03-17 DIAGNOSIS — K859 Acute pancreatitis without necrosis or infection, unspecified: Secondary | ICD-10-CM

## 2021-03-17 DIAGNOSIS — E2740 Unspecified adrenocortical insufficiency: Secondary | ICD-10-CM | POA: Diagnosis present

## 2021-03-17 DIAGNOSIS — Z96652 Presence of left artificial knee joint: Secondary | ICD-10-CM | POA: Diagnosis present

## 2021-03-17 DIAGNOSIS — E1122 Type 2 diabetes mellitus with diabetic chronic kidney disease: Secondary | ICD-10-CM | POA: Diagnosis present

## 2021-03-17 DIAGNOSIS — E1165 Type 2 diabetes mellitus with hyperglycemia: Secondary | ICD-10-CM

## 2021-03-17 DIAGNOSIS — E875 Hyperkalemia: Secondary | ICD-10-CM | POA: Diagnosis present

## 2021-03-17 DIAGNOSIS — K8020 Calculus of gallbladder without cholecystitis without obstruction: Secondary | ICD-10-CM | POA: Diagnosis not present

## 2021-03-17 DIAGNOSIS — Z20822 Contact with and (suspected) exposure to covid-19: Secondary | ICD-10-CM | POA: Diagnosis present

## 2021-03-17 DIAGNOSIS — I130 Hypertensive heart and chronic kidney disease with heart failure and stage 1 through stage 4 chronic kidney disease, or unspecified chronic kidney disease: Secondary | ICD-10-CM | POA: Diagnosis present

## 2021-03-17 DIAGNOSIS — G4733 Obstructive sleep apnea (adult) (pediatric): Secondary | ICD-10-CM | POA: Diagnosis present

## 2021-03-17 DIAGNOSIS — M549 Dorsalgia, unspecified: Secondary | ICD-10-CM

## 2021-03-17 DIAGNOSIS — I5032 Chronic diastolic (congestive) heart failure: Secondary | ICD-10-CM | POA: Diagnosis present

## 2021-03-17 DIAGNOSIS — Z800 Family history of malignant neoplasm of digestive organs: Secondary | ICD-10-CM | POA: Diagnosis not present

## 2021-03-17 DIAGNOSIS — I2510 Atherosclerotic heart disease of native coronary artery without angina pectoris: Secondary | ICD-10-CM | POA: Diagnosis present

## 2021-03-17 DIAGNOSIS — Z8249 Family history of ischemic heart disease and other diseases of the circulatory system: Secondary | ICD-10-CM | POA: Diagnosis not present

## 2021-03-17 DIAGNOSIS — N179 Acute kidney failure, unspecified: Principal | ICD-10-CM

## 2021-03-17 DIAGNOSIS — Z79899 Other long term (current) drug therapy: Secondary | ICD-10-CM | POA: Diagnosis not present

## 2021-03-17 DIAGNOSIS — I10 Essential (primary) hypertension: Secondary | ICD-10-CM | POA: Diagnosis not present

## 2021-03-17 DIAGNOSIS — K767 Hepatorenal syndrome: Secondary | ICD-10-CM | POA: Diagnosis present

## 2021-03-17 DIAGNOSIS — R7989 Other specified abnormal findings of blood chemistry: Secondary | ICD-10-CM

## 2021-03-17 DIAGNOSIS — E871 Hypo-osmolality and hyponatremia: Secondary | ICD-10-CM | POA: Diagnosis present

## 2021-03-17 DIAGNOSIS — K7689 Other specified diseases of liver: Secondary | ICD-10-CM | POA: Diagnosis not present

## 2021-03-17 DIAGNOSIS — Z807 Family history of other malignant neoplasms of lymphoid, hematopoietic and related tissues: Secondary | ICD-10-CM | POA: Diagnosis not present

## 2021-03-17 DIAGNOSIS — M545 Low back pain, unspecified: Secondary | ICD-10-CM | POA: Diagnosis not present

## 2021-03-17 DIAGNOSIS — K8689 Other specified diseases of pancreas: Secondary | ICD-10-CM | POA: Diagnosis not present

## 2021-03-17 DIAGNOSIS — K7581 Nonalcoholic steatohepatitis (NASH): Secondary | ICD-10-CM

## 2021-03-17 DIAGNOSIS — J811 Chronic pulmonary edema: Secondary | ICD-10-CM | POA: Diagnosis not present

## 2021-03-17 DIAGNOSIS — Z7982 Long term (current) use of aspirin: Secondary | ICD-10-CM | POA: Diagnosis not present

## 2021-03-17 DIAGNOSIS — Z7682 Awaiting organ transplant status: Secondary | ICD-10-CM | POA: Diagnosis not present

## 2021-03-17 DIAGNOSIS — R778 Other specified abnormalities of plasma proteins: Secondary | ICD-10-CM

## 2021-03-17 DIAGNOSIS — M542 Cervicalgia: Secondary | ICD-10-CM | POA: Diagnosis not present

## 2021-03-17 DIAGNOSIS — E785 Hyperlipidemia, unspecified: Secondary | ICD-10-CM | POA: Diagnosis present

## 2021-03-17 DIAGNOSIS — I959 Hypotension, unspecified: Secondary | ICD-10-CM | POA: Diagnosis present

## 2021-03-17 LAB — RESP PANEL BY RT-PCR (FLU A&B, COVID) ARPGX2
Influenza A by PCR: NEGATIVE
Influenza B by PCR: NEGATIVE
SARS Coronavirus 2 by RT PCR: NEGATIVE

## 2021-03-17 LAB — COMPREHENSIVE METABOLIC PANEL
ALT: 15 U/L (ref 0–44)
ALT: 18 U/L (ref 0–44)
AST: 31 U/L (ref 15–41)
AST: 31 U/L (ref 15–41)
Albumin: 2.9 g/dL — ABNORMAL LOW (ref 3.5–5.0)
Albumin: 3.2 g/dL — ABNORMAL LOW (ref 3.5–5.0)
Alkaline Phosphatase: 42 U/L (ref 38–126)
Alkaline Phosphatase: 51 U/L (ref 38–126)
Anion gap: 11 (ref 5–15)
Anion gap: 9 (ref 5–15)
BUN: 70 mg/dL — ABNORMAL HIGH (ref 8–23)
BUN: 71 mg/dL — ABNORMAL HIGH (ref 8–23)
CO2: 17 mmol/L — ABNORMAL LOW (ref 22–32)
CO2: 17 mmol/L — ABNORMAL LOW (ref 22–32)
Calcium: 8.2 mg/dL — ABNORMAL LOW (ref 8.9–10.3)
Calcium: 8.3 mg/dL — ABNORMAL LOW (ref 8.9–10.3)
Chloride: 101 mmol/L (ref 98–111)
Chloride: 99 mmol/L (ref 98–111)
Creatinine, Ser: 3.7 mg/dL — ABNORMAL HIGH (ref 0.61–1.24)
Creatinine, Ser: 3.86 mg/dL — ABNORMAL HIGH (ref 0.61–1.24)
GFR, Estimated: 17 mL/min — ABNORMAL LOW (ref >60)
GFR, Estimated: 18 mL/min — ABNORMAL LOW (ref >60)
Glucose, Bld: 270 mg/dL — ABNORMAL HIGH (ref 70–99)
Glucose, Bld: 322 mg/dL — ABNORMAL HIGH (ref 70–99)
Potassium: 5.3 mmol/L — ABNORMAL HIGH (ref 3.5–5.1)
Potassium: 6.6 mmol/L (ref 3.5–5.1)
Sodium: 125 mmol/L — ABNORMAL LOW (ref 135–145)
Sodium: 129 mmol/L — ABNORMAL LOW (ref 135–145)
Total Bilirubin: 1 mg/dL (ref 0.3–1.2)
Total Bilirubin: 1.3 mg/dL — ABNORMAL HIGH (ref 0.3–1.2)
Total Protein: 5.6 g/dL — ABNORMAL LOW (ref 6.5–8.1)
Total Protein: 6.2 g/dL — ABNORMAL LOW (ref 6.5–8.1)

## 2021-03-17 LAB — CBC WITH DIFFERENTIAL/PLATELET
Abs Immature Granulocytes: 0.05 10*3/uL (ref 0.00–0.07)
Basophils Absolute: 0 10*3/uL (ref 0.0–0.1)
Basophils Relative: 0 %
Eosinophils Absolute: 0.1 10*3/uL (ref 0.0–0.5)
Eosinophils Relative: 2 %
HCT: 30.9 % — ABNORMAL LOW (ref 39.0–52.0)
Hemoglobin: 10.3 g/dL — ABNORMAL LOW (ref 13.0–17.0)
Immature Granulocytes: 1 %
Lymphocytes Relative: 20 %
Lymphs Abs: 1.6 10*3/uL (ref 0.7–4.0)
MCH: 34.2 pg — ABNORMAL HIGH (ref 26.0–34.0)
MCHC: 33.3 g/dL (ref 30.0–36.0)
MCV: 102.7 fL — ABNORMAL HIGH (ref 80.0–100.0)
Monocytes Absolute: 1 10*3/uL (ref 0.1–1.0)
Monocytes Relative: 12 %
Neutro Abs: 5.2 10*3/uL (ref 1.7–7.7)
Neutrophils Relative %: 65 %
Platelets: 96 10*3/uL — ABNORMAL LOW (ref 150–400)
RBC: 3.01 MIL/uL — ABNORMAL LOW (ref 4.22–5.81)
RDW: 16.9 % — ABNORMAL HIGH (ref 11.5–15.5)
WBC: 8 10*3/uL (ref 4.0–10.5)
nRBC: 0 % (ref 0.0–0.2)

## 2021-03-17 LAB — GLUCOSE, CAPILLARY
Glucose-Capillary: 274 mg/dL — ABNORMAL HIGH (ref 70–99)
Glucose-Capillary: 293 mg/dL — ABNORMAL HIGH (ref 70–99)
Glucose-Capillary: 249 mg/dL — ABNORMAL HIGH (ref 70–99)
Glucose-Capillary: 223 mg/dL — ABNORMAL HIGH (ref 70–99)
Glucose-Capillary: 177 mg/dL — ABNORMAL HIGH (ref 70–99)
Glucose-Capillary: 175 mg/dL — ABNORMAL HIGH (ref 70–99)
Glucose-Capillary: 181 mg/dL — ABNORMAL HIGH (ref 70–99)
Glucose-Capillary: 217 mg/dL — ABNORMAL HIGH (ref 70–99)
Glucose-Capillary: 190 mg/dL — ABNORMAL HIGH (ref 70–99)

## 2021-03-17 LAB — LIPASE, BLOOD
Lipase: 32 U/L (ref 11–51)
Lipase: 37 U/L (ref 11–51)

## 2021-03-17 LAB — CBG MONITORING, ED: Glucose-Capillary: 362 mg/dL — ABNORMAL HIGH (ref 70–99)

## 2021-03-17 LAB — CBC
HCT: 28 % — ABNORMAL LOW (ref 39.0–52.0)
Hemoglobin: 9.1 g/dL — ABNORMAL LOW (ref 13.0–17.0)
MCH: 34 pg (ref 26.0–34.0)
MCHC: 32.5 g/dL (ref 30.0–36.0)
MCV: 104.5 fL — ABNORMAL HIGH (ref 80.0–100.0)
Platelets: 79 10*3/uL — ABNORMAL LOW (ref 150–400)
RBC: 2.68 MIL/uL — ABNORMAL LOW (ref 4.22–5.81)
RDW: 16.7 % — ABNORMAL HIGH (ref 11.5–15.5)
WBC: 5.1 10*3/uL (ref 4.0–10.5)
nRBC: 0 % (ref 0.0–0.2)

## 2021-03-17 LAB — TROPONIN I (HIGH SENSITIVITY)
Troponin I (High Sensitivity): 222 ng/L (ref <18)
Troponin I (High Sensitivity): 244 ng/L (ref <18)

## 2021-03-17 LAB — MAGNESIUM: Magnesium: 2.8 mg/dL — ABNORMAL HIGH (ref 1.7–2.4)

## 2021-03-17 LAB — HEMOGLOBIN A1C
Hgb A1c MFr Bld: 7.7 % — ABNORMAL HIGH (ref 4.8–5.6)
Mean Plasma Glucose: 174.29 mg/dL

## 2021-03-17 LAB — POTASSIUM: Potassium: 6.5 mmol/L (ref 3.5–5.1)

## 2021-03-17 LAB — LACTIC ACID, PLASMA
Lactic Acid, Venous: 1.4 mmol/L (ref 0.5–1.9)
Lactic Acid, Venous: 2.1 mmol/L (ref 0.5–1.9)

## 2021-03-17 LAB — BLOOD GAS, VENOUS
Acid-base deficit: 6 mmol/L — ABNORMAL HIGH (ref 0.0–2.0)
Bicarbonate: 19 mmol/L — ABNORMAL LOW (ref 20.0–28.0)
FIO2: 21
O2 Saturation: 68.6 %
Patient temperature: 37
pCO2, Ven: 38.5 mmHg — ABNORMAL LOW (ref 44.0–60.0)
pH, Ven: 7.315 (ref 7.250–7.430)
pO2, Ven: 41.9 mmHg (ref 32.0–45.0)

## 2021-03-17 LAB — MRSA PCR SCREENING: MRSA by PCR: NEGATIVE

## 2021-03-17 LAB — AMMONIA: Ammonia: 78 umol/L — ABNORMAL HIGH (ref 9–35)

## 2021-03-17 MED ORDER — INSULIN REGULAR(HUMAN) IN NACL 100-0.9 UT/100ML-% IV SOLN
INTRAVENOUS | Status: DC
  Administered 2021-03-17: 9 [IU]/h via INTRAVENOUS
  Administered 2021-03-17: 11.5 [IU]/h via INTRAVENOUS
  Filled 2021-03-17 (×2): qty 100

## 2021-03-17 MED ORDER — LACTULOSE 10 GM/15ML PO SOLN
20.0000 g | Freq: Every day | ORAL | Status: DC
  Administered 2021-03-17 – 2021-03-19 (×3): 20 g via ORAL
  Filled 2021-03-17 (×3): qty 30

## 2021-03-17 MED ORDER — LACTATED RINGERS IV SOLN: INTRAVENOUS | Status: DC

## 2021-03-17 MED ORDER — SODIUM BICARBONATE 8.4 % IV SOLN
50.0000 meq | Freq: Once | INTRAVENOUS | Status: AC
  Administered 2021-03-17: 50 meq via INTRAVENOUS

## 2021-03-17 MED ORDER — ALPRAZOLAM 0.5 MG PO TABS
0.5000 mg | ORAL_TABLET | Freq: Three times a day (TID) | ORAL | Status: DC | PRN
  Administered 2021-03-17 – 2021-03-23 (×5): 0.5 mg via ORAL
  Filled 2021-03-17 (×5): qty 1

## 2021-03-17 MED ORDER — DEXTROSE 50 % IV SOLN: 0.0000 mL | INTRAVENOUS | Status: DC | PRN

## 2021-03-17 MED ORDER — INSULIN ASPART 100 UNIT/ML ~~LOC~~ SOLN: 0.0000 [IU] | Freq: Three times a day (TID) | SUBCUTANEOUS | Status: DC

## 2021-03-17 MED ORDER — DEXTROSE IN LACTATED RINGERS 5 % IV SOLN: INTRAVENOUS | Status: DC

## 2021-03-17 MED ORDER — ALBUTEROL SULFATE HFA 108 (90 BASE) MCG/ACT IN AERS: 2.0000 | INHALATION_SPRAY | Freq: Four times a day (QID) | RESPIRATORY_TRACT | Status: DC | PRN

## 2021-03-17 MED ORDER — ONDANSETRON HCL 4 MG/2ML IJ SOLN
4.0000 mg | Freq: Four times a day (QID) | INTRAMUSCULAR | Status: DC | PRN
  Administered 2021-03-18: 4 mg via INTRAVENOUS
  Filled 2021-03-17: qty 2

## 2021-03-17 MED ORDER — PANTOPRAZOLE SODIUM 40 MG PO TBEC
40.0000 mg | DELAYED_RELEASE_TABLET | Freq: Every day | ORAL | Status: DC
  Administered 2021-03-17 – 2021-03-24 (×8): 40 mg via ORAL
  Filled 2021-03-17 (×8): qty 1

## 2021-03-17 MED ORDER — ASPIRIN EC 81 MG PO TBEC
81.0000 mg | DELAYED_RELEASE_TABLET | Freq: Every day | ORAL | Status: DC
  Administered 2021-03-17 – 2021-03-23 (×7): 81 mg via ORAL
  Filled 2021-03-17 (×7): qty 1

## 2021-03-17 MED ORDER — SPIRONOLACTONE 25 MG PO TABS: 50.0000 mg | ORAL_TABLET | Freq: Every day | ORAL | Status: DC

## 2021-03-17 MED ORDER — HEPARIN SODIUM (PORCINE) 5000 UNIT/ML IJ SOLN
5000.0000 [IU] | Freq: Three times a day (TID) | INTRAMUSCULAR | Status: DC
  Administered 2021-03-17 – 2021-03-21 (×13): 5000 [IU] via SUBCUTANEOUS
  Filled 2021-03-17 (×13): qty 1

## 2021-03-17 MED ORDER — NOREPINEPHRINE 4 MG/250ML-% IV SOLN
0.0000 ug/min | INTRAVENOUS | Status: DC
  Administered 2021-03-17: 2 ug/min via INTRAVENOUS
  Administered 2021-03-18: 3 ug/min via INTRAVENOUS
  Administered 2021-03-18: 6 ug/min via INTRAVENOUS
  Administered 2021-03-19: 4 ug/min via INTRAVENOUS
  Administered 2021-03-20: 2 ug/min via INTRAVENOUS
  Filled 2021-03-17 (×5): qty 250

## 2021-03-17 MED ORDER — INSULIN ASPART 100 UNIT/ML IV SOLN
10.0000 [IU] | Freq: Once | INTRAVENOUS | Status: AC
  Administered 2021-03-17: 10 [IU] via INTRAVENOUS

## 2021-03-17 MED ORDER — INSULIN DETEMIR 100 UNIT/ML ~~LOC~~ SOLN
25.0000 [IU] | Freq: Two times a day (BID) | SUBCUTANEOUS | Status: DC
  Administered 2021-03-17 – 2021-03-19 (×6): 25 [IU] via SUBCUTANEOUS
  Filled 2021-03-17 (×7): qty 0.25

## 2021-03-17 MED ORDER — DEXTROSE 50 % IV SOLN
50.0000 mL | Freq: Once | INTRAVENOUS | Status: AC
  Administered 2021-03-17: 50 mL via INTRAVENOUS
  Filled 2021-03-17: qty 50

## 2021-03-17 MED ORDER — INSULIN ASPART 100 UNIT/ML ~~LOC~~ SOLN: 0.0000 [IU] | Freq: Every day | SUBCUTANEOUS | Status: DC

## 2021-03-17 MED ORDER — ONDANSETRON HCL 4 MG PO TABS: 4.0000 mg | ORAL_TABLET | Freq: Four times a day (QID) | ORAL | Status: DC | PRN

## 2021-03-17 MED ORDER — INSULIN ASPART 100 UNIT/ML ~~LOC~~ SOLN
0.0000 [IU] | Freq: Three times a day (TID) | SUBCUTANEOUS | Status: DC
Start: 2021-03-17 — End: 2021-03-20
  Administered 2021-03-17: 3 [IU] via SUBCUTANEOUS
  Administered 2021-03-18: 5 [IU] via SUBCUTANEOUS
  Administered 2021-03-18: 2 [IU] via SUBCUTANEOUS
  Administered 2021-03-19: 7 [IU] via SUBCUTANEOUS
  Administered 2021-03-19: 2 [IU] via SUBCUTANEOUS
  Administered 2021-03-19: 5 [IU] via SUBCUTANEOUS

## 2021-03-17 MED ORDER — ALBUTEROL SULFATE (2.5 MG/3ML) 0.083% IN NEBU
10.0000 mg | INHALATION_SOLUTION | Freq: Once | RESPIRATORY_TRACT | Status: AC
  Administered 2021-03-17: 10 mg via RESPIRATORY_TRACT
  Filled 2021-03-17: qty 12

## 2021-03-17 MED ORDER — CHLORHEXIDINE GLUCONATE CLOTH 2 % EX PADS
6.0000 | MEDICATED_PAD | Freq: Every day | CUTANEOUS | Status: DC
  Administered 2021-03-18 – 2021-03-22 (×5): 6 via TOPICAL

## 2021-03-17 MED ORDER — LIDOCAINE 5 % EX PTCH
1.0000 | MEDICATED_PATCH | CUTANEOUS | Status: DC
  Administered 2021-03-18 – 2021-03-24 (×7): 1 via TRANSDERMAL
  Filled 2021-03-17 (×9): qty 1

## 2021-03-17 MED ORDER — SODIUM CHLORIDE 0.9 % IV BOLUS
500.0000 mL | Freq: Once | INTRAVENOUS | Status: AC
  Administered 2021-03-17: 500 mL via INTRAVENOUS

## 2021-03-17 MED ORDER — MIDODRINE HCL 5 MG PO TABS
10.0000 mg | ORAL_TABLET | Freq: Three times a day (TID) | ORAL | Status: DC
  Administered 2021-03-17 – 2021-03-21 (×15): 10 mg via ORAL
  Filled 2021-03-17 (×15): qty 2

## 2021-03-17 NOTE — Telephone Encounter (Signed)
Thank you for letting me know.  I am working in [place] this whole week.  I will let our rounding team know however.

## 2021-03-17 NOTE — Progress Notes (Signed)
Inpatient Diabetes Program Recommendations  AACE/ADA: New Consensus Statement on Inpatient Glycemic Control (2015)  Target Ranges:  Prepandial:   less than 140 mg/dL      Peak postprandial:   less than 180 mg/dL (1-2 hours)      Critically ill patients:  140 - 180 mg/dL   Lab Results  Component Value Date   GLUCAP 223 (H) 03/17/2021   HGBA1C 7.7 (H) 03/17/2021    Review of Glycemic Control Results for [name], [name]" (MRN [id number]) as of 03/17/2021 08:59  Ref. Range 03/17/2021 04:34 03/17/2021 05:29 03/17/2021 06:30 03/17/2021 08:05  Glucose-Capillary Latest Ref Range: 70 - 99 mg/dL 293 (H) 274 (H) 249 (H) 223 (H)   Diabetes history: DM 2 Outpatient Diabetes medications:  Novolog 5 units tid with meals Tresiba 40 units q AM Ozempic 1 mg weekly Current orders for Inpatient glycemic control:  IV insulin  Inpatient Diabetes Program Recommendations:    Note patient on IV insulin-for hyperkalemia.  Agree with current orders.  Should be able to transition to home regimen when insulin drip is stopped (needs basal insulin 2 hours prior to d/c of IV insulin).   Thanks,  Adah Perl, RN, BC-ADM Inpatient Diabetes Coordinator Pager [phone] (8a-5p)

## 2021-03-17 NOTE — Procedures (Signed)
Procedure Note  03/17/21    Preoperative Diagnosis: Acute kidney injury, hypervolemia, congestive heart failure, poor access    Postoperative Diagnosis: Same   Procedure(s) Performed: Central Line placement,  Right internal jugular    Surgeon: Lanell Matar. Constance Haw, MD   Assistants: None   Anesthesia: 1% lidocaine    Complications: None    Indications: [name] is a 60 y.o. with AKI, hypervolemia, CHF and poor access that needs CVP monitoring and lasix.  I discussed the risk and benefits of placement of the central line with him, including but not limited to bleeding, infection, and risk of pneumothorax. He has given verbal consent for the procedure.    Procedure: The patient placed supine. The right chest and neck was prepped and draped in the usual sterile fashion.  Wearing full gown and gloves, I performed the procedure.  One percent lidocaine was used for local anesthesia. An ultrasound was utilized to assess the jugular vein.  The needle with syringe was advanced into the vein with dark venous return, and a wire was placed using the Seldinger technique without difficulty.  Ectopia was not noted.  The skin was knicked and a dilator was placed, and the three lumen catheter was placed over the wire with continued control of the wire.  There was good draw back of blood from all three lumens and each flushed easily with saline.  The catheter was secured in 4 points with 2-0 silk and a biopatch and dressing was placed.     The patient tolerated the procedure well, and the CXR was ordered to confirm position of the central line.   Christopher Labrum, MD [facility] [address] [place], [place] [address] [phone] (office)

## 2021-03-17 NOTE — Telephone Encounter (Signed)
We left message for wife that Dr.McDowell was at or [facility] site this week and was not currently the hospital rounder.   I will FYI Dr.McDowell.

## 2021-03-17 NOTE — Progress Notes (Signed)
[facility]  CXR with CVL in appropriate position, no PTX.  Curlene Labrum, MD [facility] [address] [place], [place] [address] [phone] (office)

## 2021-03-17 NOTE — Progress Notes (Signed)
Patient seen and examined. Admitted after midnight with complaints of back pain, hyperglycemia, hyperkalemia and acute on chronic renal failure. Patient with PMH of CAD, chronic diastolic HF, type 2 diabetes with nephropathy, chronic kidney disease stage 3 B and also non alcoholic cirrhosis. Patient reported recent increase in his demadex as recommended by cardiology service. Patient denies abd pain, nausea, vomiting and dysuria. He suffered was chronic hypotension and uses midodrine TID. Workup in the hospital so far is suggesting the presence of mild pancreatitis and uncontrolled hyperglycemia. No anion gap metabolic acidosis, electrolytes derangements (mainly hyperkalemia/hyponatremia) and worsening renal function. Please refer to H&P written by Dr. Clearence Ped on 03/17/21 for further info/details on admission.  Plan: -continue IVF's -follow electrolytes, renal function and lactic acid -continue insulin drip for now. -close monitoring of I's and O's and daily weight -follow clinical response.  Barton Dubois MD [phone]

## 2021-03-17 NOTE — H&P (Signed)
TRH H&P    Patient Demographics:    [name], is a 60 y.o. male  MRN: [id number]  DOB - 1961-09-26  Admit Date - 03/16/2021  Referring MD/NP/PA: Stark Jock  Outpatient Primary MD for the patient is [name], MD  Patient coming from: [facility]  Chief complaint- Back pain   HPI:    [name]  is a 60 y.o. male, with history of type 2 diabetes mellitus, thrombocytopenia, hypotension, hyperlipidemia, CKD, nonalcoholic cirrhosis, coronary artery disease status post CABG, and more presents to the ED with a chief complaint of back pain.  Patient reports that the pain was acute in onset in his lower back yesterday.  It got gradually worse through the night.  He reports that at its worst it was a 10 out of 10 and right now it is an 8 out of 10.  He reports that he attempted slouched walking to relieve the pain, but did not made his neck hurt.  He did not try any pain medications as he is very careful with medications due to his cirrhosis and kidney disease.  He reports that he attempted sitting straight up in a chair and that seemed to help the pain.  He tried Biofreeze and that helped and then he was able to get to sleep.  Patient reports when he woke up this morning the pain was much worse and he can even walk.  He reports no recent falls or trauma to his back.  The pain is not on one side more than the other but across the whole back.  Pain is not associated with with voiding.  Patient also has a history of hypotension for which he takes midodrine.  He takes his midodrine 3 times a day and has missed 2 doses today.  His blood pressures are low in the ER, but he is asymptomatic.  Patient denies any decrease in appetite, nausea, abdominal pain.  Patient also denies chest pain, palpitations, worse shortness of breath.  He does admit to fluid retention and reports that he has had about 5 pounds of weight gain in just  a few days.  CT scan of the abdomen does not comment on ascites.  Patient does not smoke, does not drink alcohol, does not use illicit drugs, is vaccinated for COVID, and wishes to remain full code.  Goals of care discussion may be beneficial.  In the ED Temp 98.7, heart rate 65-72, respiratory rate 14-30, blood pressure 84/51 -104/58 satting 100% No leukocytosis with a white blood cell count of 8 Chemistry panel reveals hyponatremia at 125, hyperkalemia at 6.6, decreased bicarb at 17, BUN of 70, creatinine of 3.86, glucose 322 CT for cervical spine shows no acute bony abnormality CT renal study shows pancreatitis EKG shows a heart rate is 70 sinus rhythm QTC 418 Admission was requested to tune up electrolytes, glucose, and AKI as well as trend the second troponin    Review of systems:    In addition to the HPI above,  No Fever-chills, No Headache, No changes with Vision or  hearing, No problems swallowing food or Liquids, No Chest pain, Cough or Shortness of Breath, No Abdominal pain, No Nausea or Vomiting, bowel movements are regular, No Blood in stool or Urine, No dysuria, No new skin rashes or bruises, Admits to acute onset back pain No new asymmetric weakness, tingling, numbness in any extremity, Admits to 5 pounds weight gain No polyuria, polydypsia or polyphagia, No significant Mental Stressors.  All other systems reviewed and are negative.    Past History of the following :    Past Medical History:  Diagnosis Date  . Anemia   . Arthritis   . CAD (coronary artery disease)    Multivessel disease status post CABG 08/2015  . Cirrhosis ([place])   . CKD (chronic kidney disease) stage 3, GFR 30-59 ml/min (HCC)   . Diastolic CHF ([place])   . Essential hypertension   . Hyperlipidemia   . OSA on CPAP   . Polyclonal gammopathy   . Thrombocytopenia ([place]) 2016  . Type 2 diabetes mellitus ([place])       Past Surgical History:  Procedure Laterality Date  . APPENDECTOMY    .  Biceps tendon surgery Right   . BIOPSY  11/22/2019   Procedure: BIOPSY;  Surgeon: Daneil Dolin, MD;  [facility];  Service: Endoscopy;;  . CARDIAC CATHETERIZATION N/A 08/25/2015   Procedure: Left Heart Cath and Coronary Angiography;  Surgeon: Belva Crome, MD;  Location: [facility];  Service: Cardiovascular;  Laterality: N/A;  . COLONOSCOPY WITH PROPOFOL N/A 11/22/2019   Procedure: COLONOSCOPY WITH PROPOFOL;  Surgeon: Daneil Dolin, MD; Four 4-5 mm polyps, findings suggestive of portal colopathy, congested appearing colonic mucosa diffusely, rectal varices, and adequate right colon prep.  Pathology with tubular adenomas and hyperplastic polyp.  Right colon biopsy with focal active colitis.  Recommendations to repeat colonoscopy in 3 months due to poor prep.  . COLONOSCOPY WITH PROPOFOL N/A 03/17/2020   Procedure: COLONOSCOPY WITH PROPOFOL;  Surgeon: Daneil Dolin, MD;  [facility];  Service: Endoscopy;  Laterality: N/A;  8:45am - pt does not need covid test, was + 2/4 <90 days  . CORONARY ARTERY BYPASS GRAFT N/A 08/29/2015   Procedure: CORONARY ARTERY BYPASS GRAFTING (CABG);  Surgeon: [name], MD;  Location: [facility];  Service: Open Heart Surgery;  Laterality: N/A;  . ESOPHAGOGASTRODUODENOSCOPY (EGD) WITH PROPOFOL N/A 11/22/2019   Procedure: ESOPHAGOGASTRODUODENOSCOPY (EGD) WITH PROPOFOL;  Surgeon: Daneil Dolin, MD; 4 columns of grade 2-3 esophageal varices, portal gastropathy, gastric polyp/abnormal gastric mucosa s/p biopsy.  Pathology with hyperplastic polyp, mild chronic gastritis, negative H. pylori.  [name] KNEE ARTHROSCOPY Left   . TEE WITHOUT CARDIOVERSION N/A 08/29/2015   Procedure: TRANSESOPHAGEAL ECHOCARDIOGRAM (TEE);  Surgeon: [name], MD;  Location: [facility];  Service: Open Heart Surgery;  Laterality: N/A;  . TOTAL KNEE ARTHROPLASTY Left 03/09/2017   Procedure: TOTAL KNEE ARTHROPLASTY;  Surgeon: Carole Civil, MD;  Location: [facility];  Service: Orthopedics;  Laterality: Left;      Social History:      Social History   Tobacco Use  . Smoking status: Never Smoker  . Smokeless tobacco: Never Used  Substance Use Topics  . Alcohol use: No    Alcohol/week: 0.0 standard drinks       Family History :     Family History  Problem Relation Age of Onset  . Arthritis Other   . Cancer Other   . Diabetes Other   .  CAD Father   . Diabetes Mellitus II Father   . Liver cancer Father   . Hodgkin's lymphoma Father   . CAD Brother   . Diabetes Mellitus II Brother   . ALS Mother   . Diabetes Mellitus II Sister   . Diabetes Mellitus II Brother   . Diabetes Mellitus II Maternal Grandmother   . Aneurysm Maternal Grandmother   . Cancer Maternal Grandfather   . Anesthesia problems Neg Hx   . Hypotension Neg Hx   . Malignant hyperthermia Neg Hx   . Pseudochol deficiency Neg Hx   . Colon cancer Neg Hx       Home Medications:   Prior to Admission medications   Medication Sig Start Date End Date Taking? Authorizing Provider  acetaminophen (TYLENOL) 325 MG tablet Take 650 mg by mouth every 6 (six) hours as needed. 12/19/20   [provider]  albuterol (VENTOLIN HFA) 108 (90 Base) MCG/ACT inhaler Inhale 2 puffs into the lungs every 6 (six) hours as needed for wheezing or shortness of breath. 12/22/20   Gerlene Fee, NP  ALPRAZolam Duanne Moron) 0.5 MG tablet Take 1 tablet (0.5 mg total) by mouth daily as needed. 12/22/20   Gerlene Fee, NP  Armodafinil 200 MG TABS Take 200 mg by mouth daily as needed (for sleepiness). 12/22/20   Gerlene Fee, NP  aspirin EC 81 MG tablet Take 81 mg by mouth at bedtime.     [provider]  Cholecalciferol (DIALYVITE VITAMIN D 5000) 125 MCG (5000 UT) capsule Take 5,000 Units by mouth in the morning.     [provider]  Continuous Blood Gluc Receiver (FREESTYLE LIBRE 14 DAY READER) DEVI 1 reader 12/22/20   Gerlene Fee, NP  Continuous Blood Gluc Sensor  (FREESTYLE LIBRE 14 DAY SENSOR) MISC 1 sensor 12/28/17   [provider]  Elastic Bandages & Supports ([place]) [place] 1 each by Does not apply route as directed. Low pressure knee high compression stockings Dx: leg swelling 05/13/20   Satira Sark, MD  ferrous sulfate 325 (65 FE) MG tablet Take 325 mg by mouth in the morning.     [provider]  Flaxseed, Linseed, (FLAXSEED OIL) 1000 MG CAPS Take 1,000 mg by mouth at bedtime. 12/16/20   [provider]  INS SYRINGE/NEEDLE .5CC/28G 28G X 1/2" 0.5 ML MISC Inject 1 Syringe into the skin See admin instructions. 12/22/20   Gerlene Fee, NP  insulin aspart (NOVOLOG) 100 UNIT/ML injection Inject 5 Units into the skin 3 (three) times daily before meals. Special Instructions: If accu-check is greater than 150. Hold for accu-check 150 or below. With Meals 12/22/20   Gerlene Fee, NP  insulin degludec (TRESIBA FLEXTOUCH) 200 UNIT/ML FlexTouch Pen Inject 40 Units into the skin in the morning. Give 30 units Subcutaneous  at Bedtime 8:00 pm 12/22/20   Gerlene Fee, NP  lactulose (CHRONULAC) 10 GM/15ML solution Take 30 mLs (20 g total) by mouth daily. 12/22/20   Gerlene Fee, NP  loratadine (CLARITIN) 10 MG tablet Take 10 mg by mouth in the morning.     [provider]  Magnesium 250 MG TABS Take 250 mg by mouth daily.    [provider]  Misc Natural Products (ELDERBERRY ZINC/VIT C/IMMUNE) LOZG Take 1 lozenge by mouth at bedtime. 12/16/20   [provider]  NON FORMULARY Diet: _____ Regular, __x____ NAS, __x_____Consistent Carbohydrate, _______NPO _____Other 12/16/20   [provider]  omeprazole (PRILOSEC) 40 MG capsule Take 1 capsule (40 mg total) by mouth at bedtime. 12/22/20   Gerlene Fee, NP  OZEMPIC, 1 MG/DOSE, 4 MG/3ML SOPN Inject 1 mg into the skin once a week. 12/22/20   Gerlene Fee, NP  potassium chloride SA (KLOR-CON) 20 MEQ tablet Take 20 mEq by  mouth 2 (two) times daily. 12/31/20   [provider]  pravastatin (PRAVACHOL) 20 MG tablet TAKE 1 TABLET(20 MG) BY MOUTH AT BEDTIME 12/22/20   Gerlene Fee, NP  spironolactone (ALDACTONE) 100 MG tablet Take 0.5 tablets (50 mg total) by mouth daily. 12/22/20   Gerlene Fee, NP  torsemide (DEMADEX) 20 MG tablet Take 1 tablet (20 mg total) by mouth daily. 01/09/21 04/09/21  Satira Sark, MD  TURMERIC CURCUMIN PO Take 2,000 mg by mouth at bedtime.     [provider]     Allergies:     Allergies  Allergen Reactions  . Fluticasone Rash     Physical Exam:   Vitals  Blood pressure (!) 87/43, pulse 72, temperature 98.7 F (37.1 C), temperature source Oral, resp. rate 19, height 5\' 10"  (1.778 m), weight 120 kg, SpO2 100 %.  1.  General: Patient lying supine in bed, drowsy, no acute distress  2. Psychiatric: Mood and behavior normal for situation, oriented x3, pleasant and cooperative with exam  3. Neurologic: Face is symmetric, speech and language are normal, drowsy, but oriented, moves all 4 extremities voluntarily, no acute focal deficit on limited exam  4. HEENMT:  Head is atraumatic, normocephalic, pupils reactive to light, neck is supple, trachea is midline, mucous membranes are moist  5. Respiratory : Diminished breath sounds in the lower lung fields bilaterally, no wheezes, crackles, rhonchi, no cyanosis, no clubbing  6. Cardiovascular : Heart rate is normal, rhythm is regular, no murmurs rubs or gallops, 3+ pitting edema, venous stasis changes of the lower extremities  7. Gastrointestinal:  Abdomen is soft, fluid wave, bowel sounds active, nontender, distended with fluid, no guarding  8. Skin:  Venous stasis changes of the lower extremities bilaterally, rough skin on abdomen that almost appears as a callus in the lower right quadrant, no acute rashes or lesions on limited exam  9.Musculoskeletal:  No calf tenderness, no acute deformities,  peripheral edema present, peripheral pulses palpated    Data Review:    CBC Recent Labs  Lab 03/16/21 2332  WBC 8.0  HGB 10.3*  HCT 30.9*  PLT 96*  MCV 102.7*  MCH 34.2*  MCHC 33.3  RDW 16.9*  LYMPHSABS 1.6  MONOABS 1.0  EOSABS 0.1  BASOSABS 0.0   ------------------------------------------------------------------------------------------------------------------  Results for orders placed or performed during the hospital encounter of 03/16/21 (from the past 48 hour(s))  Comprehensive metabolic panel     Status: Abnormal   Collection Time: 03/16/21 11:32 PM  Result Value Ref Range   Sodium 125 (L) 135 - 145 mmol/L   Potassium 6.6 (HH) 3.5 - 5.1 mmol/L    Comment: CRITICAL RESULT CALLED TO, READ BACK BY AND VERIFIED WITH: FLYNN,RN@0042  03/17/21 MKELLY NO VISIBLE HEMOLYSIS CORRECTED ON 04/05 AT 0045: PREVIOUSLY REPORTED AS 6.6 CRITICAL RESULT CALLED TO, READ BACK BY AND VERIFIED WITH: FLYNN,RN@0042  03/17/21 MKELLY    Chloride 99 98 - 111 mmol/L   CO2 17 (L) 22 - 32 mmol/L   Glucose, Bld 322 (H) 70 - 99 mg/dL    Comment: Glucose reference range applies only to samples taken after fasting for at least 8  hours.   BUN 70 (H) 8 - 23 mg/dL   Creatinine, Ser 3.86 (H) 0.61 - 1.24 mg/dL   Calcium 8.3 (L) 8.9 - 10.3 mg/dL   Total Protein 6.2 (L) 6.5 - 8.1 g/dL   Albumin 3.2 (L) 3.5 - 5.0 g/dL   AST 31 15 - 41 U/L   ALT 18 0 - 44 U/L   Alkaline Phosphatase 51 38 - 126 U/L   Total Bilirubin 1.3 (H) 0.3 - 1.2 mg/dL   GFR, Estimated 17 (L) >60 mL/min    Comment: (NOTE) Calculated using the CKD-EPI Creatinine Equation (2021)    Anion gap 9 5 - 15    Comment: Performed at [facility], [address], [place], [place] [address]  Lipase, blood     Status: None   Collection Time: 03/16/21 11:32 PM  Result Value Ref Range   Lipase 37 11 - 51 U/L    Comment: Performed at [facility], [address], [place], [place] [address]  CBC with Differential     Status: Abnormal   Collection  Time: 03/16/21 11:32 PM  Result Value Ref Range   WBC 8.0 4.0 - 10.5 K/uL   RBC 3.01 (L) 4.22 - 5.81 MIL/uL   Hemoglobin 10.3 (L) 13.0 - 17.0 g/dL   HCT 30.9 (L) 39.0 - 52.0 %   MCV 102.7 (H) 80.0 - 100.0 fL   MCH 34.2 (H) 26.0 - 34.0 pg   MCHC 33.3 30.0 - 36.0 g/dL   RDW 16.9 (H) 11.5 - 15.5 %   Platelets 96 (L) 150 - 400 K/uL    Comment: Immature Platelet Fraction may be clinically indicated, consider ordering this additional test [id number] SPECIMEN CHECKED FOR CLOTS PLATELET COUNT CONFIRMED BY SMEAR    nRBC 0.0 0.0 - 0.2 %   Neutrophils Relative % 65 %   Neutro Abs 5.2 1.7 - 7.7 K/uL   Lymphocytes Relative 20 %   Lymphs Abs 1.6 0.7 - 4.0 K/uL   Monocytes Relative 12 %   Monocytes Absolute 1.0 0.1 - 1.0 K/uL   Eosinophils Relative 2 %   Eosinophils Absolute 0.1 0.0 - 0.5 K/uL   Basophils Relative 0 %   Basophils Absolute 0.0 0.0 - 0.1 K/uL   Immature Granulocytes 1 %   Abs Immature Granulocytes 0.05 0.00 - 0.07 K/uL    Comment: Performed at [facility], [address], [place], [place] [address]  Protime-INR     Status: None   Collection Time: 03/16/21 11:32 PM  Result Value Ref Range   Prothrombin Time 14.2 11.4 - 15.2 seconds   INR 1.1 0.8 - 1.2    Comment: (NOTE) INR goal varies based on device and disease states. Performed at [facility] [address] [place], [place] [address]   Troponin I (High Sensitivity)     Status: Abnormal   Collection Time: 03/16/21 11:32 PM  Result Value Ref Range   Troponin I (High Sensitivity) 244 (HH) <18 ng/L    Comment: CRITICAL RESULT CALLED TO, READ BACK BY AND VERIFIED WITH: FLYNN,RN@0042  03/17/21 MKELLY (NOTE) Elevated high sensitivity troponin I (hsTnI) values and significant  changes across serial measurements may suggest ACS but many other  chronic and acute conditions are known to elevate hsTnI results.  Refer to the Links section for chest pain algorithms and additional  guidance. Performed at [facility],  [address], [place], [place] [address]   Resp Panel by RT-PCR (Flu A&B, Covid) Nasopharyngeal Swab  Status: None   Collection Time: 03/17/21  1:28 AM   Specimen: Nasopharyngeal Swab; Nasopharyngeal(NP) swabs in vial transport medium  Result Value Ref Range   SARS Coronavirus 2 by RT PCR NEGATIVE NEGATIVE    Comment: (NOTE) SARS-CoV-2 target nucleic acids are NOT DETECTED.  The SARS-CoV-2 RNA is generally detectable in upper respiratory specimens during the acute phase of infection. The lowest concentration of SARS-CoV-2 viral copies this assay can detect is 138 copies/mL. A negative result does not preclude SARS-Cov-2 infection and should not be used as the sole basis for treatment or other patient management decisions. A negative result may occur with  improper specimen collection/handling, submission of specimen other than nasopharyngeal swab, presence of viral mutation(s) within the areas targeted by this assay, and inadequate number of viral copies(<138 copies/mL). A negative result must be combined with clinical observations, patient history, and epidemiological information. The expected result is Negative.  Fact Sheet for Patients:  EntrepreneurPulse.com.au  Fact Sheet for Healthcare Providers:  IncredibleEmployment.be  This test is no t yet approved or cleared by the [place] FDA and  has been authorized for detection and/or diagnosis of SARS-CoV-2 by FDA under an Emergency Use Authorization (EUA). This EUA will remain  in effect (meaning this test can be used) for the duration of the COVID-19 declaration under Section 564(b)(1) of the Act, 21 U.S.C.section 360bbb-3(b)(1), unless the authorization is terminated  or revoked sooner.       Influenza A by PCR NEGATIVE NEGATIVE   Influenza B by PCR NEGATIVE NEGATIVE    Comment: (NOTE) The Xpert Xpress SARS-CoV-2/FLU/RSV plus assay is intended as an aid in the diagnosis of influenza  from Nasopharyngeal swab specimens and should not be used as a sole basis for treatment. Nasal washings and aspirates are unacceptable for Xpert Xpress SARS-CoV-2/FLU/RSV testing.  Fact Sheet for Patients: EntrepreneurPulse.com.au  Fact Sheet for Healthcare Providers: IncredibleEmployment.be  This test is not yet approved or cleared by the [place] FDA and has been authorized for detection and/or diagnosis of SARS-CoV-2 by FDA under an Emergency Use Authorization (EUA). This EUA will remain in effect (meaning this test can be used) for the duration of the COVID-19 declaration under Section 564(b)(1) of the Act, 21 U.S.C. section 360bbb-3(b)(1), unless the authorization is terminated or revoked.  Performed at [facility], [address] [place], [place] [address]   Troponin I (High Sensitivity)     Status: Abnormal   Collection Time: 03/17/21  1:31 AM  Result Value Ref Range   Troponin I (High Sensitivity) 222 (HH) <18 ng/L    Comment: DELTA CHECK NOTED RESULT CALLED TO, READ BACK BY AND VERIFIED WITH: D FLYNN,RN@0239  03/17/21 MKELLY (NOTE) Elevated high sensitivity troponin I (hsTnI) values and significant  changes across serial measurements may suggest ACS but many other  chronic and acute conditions are known to elevate hsTnI results.  Refer to the Links section for chest pain algorithms and additional  guidance. Performed at [facility], [address] [place], [place] [address]   Potassium     Status: Abnormal   Collection Time: 03/17/21  1:31 AM  Result Value Ref Range   Potassium 6.5 (HH) 3.5 - 5.1 mmol/L    Comment: NO VISIBLE HEMOLYSIS CRITICAL RESULT CALLED TO, READ BACK BY AND VERIFIED WITH: D FLYNN,RN@0238  03/17/21 MKELLY Performed at [facility], [address], [place], [place] [address]   Lactic acid, plasma     Status: None   Collection Time: 03/17/21  2:32 AM  Result Value Ref Range   Lactic Acid, Venous 1.4 0.5  - 1.9 mmol/L    Comment: Performed at [facility], [address] [place], [place] [address]  CBG monitoring, ED     Status: Abnormal   Collection Time: 03/17/21  2:57 AM  Result Value Ref Range   Glucose-Capillary 362 (H) 70 - 99 mg/dL    Comment: Glucose reference range applies only to samples taken after fasting for at least 8 hours.    Chemistries  Recent Labs  Lab 03/16/21 2332 03/17/21 0131  NA 125*  --   K 6.6* 6.5*  CL 99  --   CO2 17*  --   GLUCOSE 322*  --   BUN 70*  --   CREATININE 3.86*  --   CALCIUM 8.3*  --   AST 31  --   ALT 18  --   ALKPHOS 51  --   BILITOT 1.3*  --    ------------------------------------------------------------------------------------------------------------------  ------------------------------------------------------------------------------------------------------------------ GFR: Estimated Creatinine Clearance: 26.1 mL/min (A) (by C-G formula based on SCr of 3.86 mg/dL (H)). Liver Function Tests: Recent Labs  Lab 03/16/21 2332  AST 31  ALT 18  ALKPHOS 51  BILITOT 1.3*  PROT 6.2*  ALBUMIN 3.2*   Recent Labs  Lab 03/16/21 2332  LIPASE 37   No results for input(s): AMMONIA in the last 168 hours. Coagulation Profile: Recent Labs  Lab 03/16/21 2332  INR 1.1   Cardiac Enzymes: No results for input(s): CKTOTAL, CKMB, CKMBINDEX, TROPONINI in the last 168 hours. BNP (last 3 results) No results for input(s): PROBNP in the last 8760 hours. HbA1C: No results for input(s): HGBA1C in the last 72 hours. CBG: Recent Labs  Lab 03/17/21 0257  GLUCAP 362*   Lipid Profile: No results for input(s): CHOL, HDL, LDLCALC, TRIG, CHOLHDL, LDLDIRECT in the last 72 hours. Thyroid Function Tests: No results for input(s): TSH, T4TOTAL, FREET4, T3FREE, THYROIDAB in the last 72 hours. Anemia Panel: No results for input(s): VITAMINB12, FOLATE, FERRITIN, TIBC, IRON, RETICCTPCT in the last 72  hours.  --------------------------------------------------------------------------------------------------------------- Urine analysis:    Component Value Date/Time   COLORURINE YELLOW 12/09/2020 1700   APPEARANCEUR CLEAR 12/09/2020 1700   LABSPEC 1.008 12/09/2020 1700   PHURINE 5.0 12/09/2020 1700   GLUCOSEU NEGATIVE 12/09/2020 1700   HGBUR NEGATIVE 12/09/2020 1700   BILIRUBINUR NEGATIVE 12/09/2020 1700   KETONESUR NEGATIVE 12/09/2020 1700   PROTEINUR NEGATIVE 12/09/2020 1700   UROBILINOGEN 1.0 08/28/2015 1634   NITRITE NEGATIVE 12/09/2020 1700   LEUKOCYTESUR NEGATIVE 12/09/2020 1700      Imaging Results:    CT CERVICAL SPINE WO CONTRAST  Result Date: 03/17/2021 CLINICAL DATA:  Neck pain EXAM: CT CERVICAL SPINE WITHOUT CONTRAST TECHNIQUE: Multidetector CT imaging of the cervical spine was performed without intravenous contrast. Multiplanar CT image reconstructions were also generated. COMPARISON:  None. FINDINGS: Alignment: No subluxation. Skull base and vertebrae: No acute fracture. No primary bone lesion or focal pathologic process. Soft tissues and spinal canal: No prevertebral fluid or swelling. No visible canal hematoma. Disc levels:  Disc spaces maintained. Upper chest: Negative Other: None IMPRESSION: No acute bony abnormality. Electronically Signed   By: Rolm Baptise M.D.   On: 03/17/2021 01:05   CT L-SPINE NO CHARGE  Result Date: 03/17/2021 CLINICAL DATA:  Initial evaluation for acute severe lower back pain. EXAM: CT LUMBAR SPINE WITHOUT CONTRAST TECHNIQUE: Multidetector CT imaging of the lumbar spine was performed without intravenous contrast administration. Multiplanar CT image reconstructions were also generated. COMPARISON:  None available. FINDINGS: Segmentation: Standard. Lowest well-formed disc space labeled the L5-S1 level. Alignment: Trace anterolisthesis of L4 on L5, chronic and facet mediated. Alignment otherwise normal preservation of the normal lumbar lordosis.  Vertebrae: Vertebral body height maintained without evidence for acute or chronic fracture. Visualized sacrum and pelvis intact. SI joints approximated symmetric. No discrete or worrisome osseous lesions. Paraspinal and other soft tissues: Paraspinous soft tissues demonstrate no acute finding. Disc levels: L1-2:  Unremarkable. L2-3: Mild disc bulge, slightly eccentric to the right. No spinal stenosis. Foramina remain patent. L3-4: Mild diffuse disc bulge with annular calcification. Superimposed right foraminal to extraforaminal disc protrusion closely approximates and potentially affects the exiting right L3 nerve root (series 7, image 66). Superimposed mild facet hypertrophy. No more than mild spinal stenosis. Mild bilateral L3 foraminal narrowing. L4-5: Trace anterolisthesis. Diffuse disc bulge, slightly asymmetric to the right. Mild to moderate facet and ligament flavum hypertrophy. Resultant moderate canal with bilateral subarticular stenosis. Moderate right worse than left L4 foraminal narrowing. L5-S1: Mild disc bulge. Superimposed left foraminal disc protrusion closely approximates and could potentially affect the exiting left L5 nerve root (series 7, image 97). Mild to moderate bilateral facet hypertrophy. No significant canal or lateral recess stenosis. Mild right with moderate left L5 foraminal narrowing. IMPRESSION: 1. No acute osseous abnormality within the lumbar spine. 2. Trace anterolisthesis of L4 on L5 with associated disc bulge and facet hypertrophy, resulting in moderate canal with bilateral subarticular stenosis. 3. Right foraminal to extraforaminal disc protrusion at L3-4, closely approximating and potentially affecting the exiting right L3 nerve root. 4. Left foraminal disc protrusion at L5-S1, potentially affecting the exiting left L5 nerve root. 5. Mild-to-moderate lower lumbar facet arthropathy. Finding could contribute to lower back pain. Electronically Signed   By: Jeannine Boga  M.D.   On: 03/17/2021 01:13   CT Renal Stone Study  Result Date: 03/17/2021 CLINICAL DATA:  Severe low back pain and neck pain.  No injury. EXAM: CT ABDOMEN AND PELVIS WITHOUT CONTRAST TECHNIQUE: Multidetector CT imaging of the abdomen and pelvis was performed following the standard protocol without IV contrast. COMPARISON:  05/16/2016 FINDINGS: Lower chest: Previous sternotomy.  Lung bases are clear. Hepatobiliary: Probable hepatic cirrhosis with nodular liver contour and enlarged lateral segment left lobe. No focal lesions are identified on noncontrast imaging. Cholelithiasis. No bile duct dilatation. Pancreas: Edema and stranding in the peripancreatic fat. No loculated collections. No pancreatic ductal dilatation. Spleen: Normal in size without focal abnormality. Adrenals/Urinary Tract: Adrenal glands are unremarkable. Kidneys are normal, without renal calculi, focal lesion, or hydronephrosis. Bladder is unremarkable. Stomach/Bowel: Stomach, small bowel, and colon are not abnormally distended. No wall thickening or inflammatory changes appreciated. Appendix is not identified. Vascular/Lymphatic: Aortic atherosclerosis. No enlarged abdominal or pelvic lymph nodes. Reproductive: Prostate is unremarkable. Other: No free air or free fluid in the abdomen. Abdominal wall musculature appears intact. Musculoskeletal: Normal alignment of the lumbar spine. Degenerative changes most prominent in the lower thoracic spine. No destructive bone lesions. IMPRESSION: 1. Edema and stranding in the peripancreatic fat consistent with acute pancreatitis. No loculated collections. 2. Probable hepatic cirrhosis. 3. Cholelithiasis without evidence of cholecystitis. 4. Aortic atherosclerosis. Aortic Atherosclerosis (ICD10-I70.0). Electronically Signed   By: Lucienne Capers M.D.   On: 03/17/2021 00:49      Assessment & Plan:    Principal Problem:   AKI (acute kidney injury) ([place]) Active Problems:   Type 2 diabetes mellitus  with hyperglycemia (HCC)   Hyponatremia   Hypotension   Hyperkalemia  Elevated troponin   Back pain   1. AKI 1. 2/2 hepato renal syndrome 2. Pressures are too soft for lasix at this time 3. Cr at baseline 1.66, today 3.86 4. Avoid nephrotoxic agents when possible 5. On list for renal transplant 6. If AKI worsens - consider levophed and lasix  7. Albumin is low but stable at this time  8. Trend in the AM 2. Hyperkalemia 1. Potassium 6.6 2. 2/2 AKI and home supplementation 3. Novolog and D50 given in ED - in the setting of hyperglycemia 4. Repeat K+ 6.5 5. Continuous beta agonist ordered, as well as insulin drip to shift the potassium 6. If next K+ is still elevated - will consider lokelma 7. Monitor on tele 8. No EKG changes 3. Hyponatremia 1. Today Na is 125 2. Baseline Na is 130 3. Hypervolemic hyponatremia 4. Can't give lasix at this time 2/2 hypotension 5. Fluid restriction - patient is NPO at this time 6. Trend in the AM 4. Back pain 1. CT renal study reveals no cause of back pain 2. History consistent with MSK back pain 3. Avoiding tylenol and ibuprofen when possible 2/2 hepatorenal syndrome 4. Lidocaine patch ordered 5. Pancreatitis 1. Asymtpomatic 2. Seen on CT 3. NPO 4. Trend lipase in AM 6. Elevated trop 1. Down trending 244 to 222 2. No ischemic changes on EKG 3. Monitor on tele 4. Most likely 2/2 AKI 7. Hyperglycemia in the setting of DMII 1. Insulin drip started for hyperglycemia 2. Continue Endotool 8. Hypotension 1. Pressures soft at baseline 2. Restart midodrine 3. Holding boluses in hypervolemic status 4.  9.    DVT Prophylaxis-   heparin - SCDs  AM Labs Ordered, also please review Full Orders  Family Communication: No family at bedside  Code Status:  Full Admission status: Inpatient :The appropriate admission status for this patient is INPATIENT. Inpatient status is judged to be reasonable and necessary in order to provide the  required intensity of service to ensure the patient's safety. The patient's presenting symptoms, physical exam findings, and initial radiographic and laboratory data in the context of their chronic comorbidities is felt to place them at high risk for further clinical deterioration. Furthermore, it is not anticipated that the patient will be medically stable for discharge from the hospital within 2 midnights of admission. The following factors support the admission status of inpatient.      The patient's presenting symptoms include back and neck pain The worrisome physical exam findings include hypotension. The initial radiographic and laboratory data are worrisome because of hyperkalemia, AKI The chronic co-morbidities include NASH, hepatorenal syndrome, DMII,        * I certify that at the point of admission it is my clinical judgment that the patient will require inpatient hospital care spanning beyond 2 midnights from the point of admission due to high intensity of service, high risk for further deterioration and high frequency of surveillance required.*  Time spent in minutes : [place]

## 2021-03-17 NOTE — Telephone Encounter (Signed)
New message    Wife called to let you know [name] is admitted to [facility] ICU he is not doing well , they said that he has a kidney injury and pancreatitis.  They would like you to round on him while he is in ICU .  His bp is 87/48 it was 71/37 last nght when the squad picked him put to take to ER.

## 2021-03-18 DIAGNOSIS — N179 Acute kidney failure, unspecified: Secondary | ICD-10-CM | POA: Diagnosis not present

## 2021-03-18 LAB — LACTIC ACID, PLASMA: Lactic Acid, Venous: 1.2 mmol/L (ref 0.5–1.9)

## 2021-03-18 LAB — MAGNESIUM: Magnesium: 2.6 mg/dL — ABNORMAL HIGH (ref 1.7–2.4)

## 2021-03-18 LAB — CBC
HCT: 31.4 % — ABNORMAL LOW (ref 39.0–52.0)
Hemoglobin: 10.3 g/dL — ABNORMAL LOW (ref 13.0–17.0)
MCH: 33.6 pg (ref 26.0–34.0)
MCHC: 32.8 g/dL (ref 30.0–36.0)
MCV: 102.3 fL — ABNORMAL HIGH (ref 80.0–100.0)
Platelets: 102 10*3/uL — ABNORMAL LOW (ref 150–400)
RBC: 3.07 MIL/uL — ABNORMAL LOW (ref 4.22–5.81)
RDW: 16.8 % — ABNORMAL HIGH (ref 11.5–15.5)
WBC: 6.5 10*3/uL (ref 4.0–10.5)
nRBC: 0 % (ref 0.0–0.2)

## 2021-03-18 LAB — BASIC METABOLIC PANEL
Anion gap: 8 (ref 5–15)
BUN: 60 mg/dL — ABNORMAL HIGH (ref 8–23)
CO2: 21 mmol/L — ABNORMAL LOW (ref 22–32)
Calcium: 8.7 mg/dL — ABNORMAL LOW (ref 8.9–10.3)
Chloride: 106 mmol/L (ref 98–111)
Creatinine, Ser: 2.48 mg/dL — ABNORMAL HIGH (ref 0.61–1.24)
GFR, Estimated: 29 mL/min — ABNORMAL LOW (ref >60)
Glucose, Bld: 110 mg/dL — ABNORMAL HIGH (ref 70–99)
Potassium: 5.4 mmol/L — ABNORMAL HIGH (ref 3.5–5.1)
Sodium: 135 mmol/L (ref 135–145)

## 2021-03-18 LAB — AMMONIA: Ammonia: 55 umol/L — ABNORMAL HIGH (ref 9–35)

## 2021-03-18 LAB — GLUCOSE, CAPILLARY
Glucose-Capillary: 96 mg/dL (ref 70–99)
Glucose-Capillary: 195 mg/dL — ABNORMAL HIGH (ref 70–99)
Glucose-Capillary: 275 mg/dL — ABNORMAL HIGH (ref 70–99)
Glucose-Capillary: 272 mg/dL — ABNORMAL HIGH (ref 70–99)

## 2021-03-18 MED ORDER — SODIUM ZIRCONIUM CYCLOSILICATE 10 G PO PACK
10.0000 g | PACK | Freq: Once | ORAL | Status: AC
  Administered 2021-03-18: 10 g via ORAL
  Filled 2021-03-18: qty 1

## 2021-03-18 NOTE — Progress Notes (Signed)
    Made aware by the patient's wife he is currently admitted to the hospital (see phone note from 03/17/2021). Mr. Treadway is well-known to our practice as he has a history of CAD (s/p CABG in 2016) and chronic diastolic CHF. Also has known liver cirrhosis and is being followed at [facility] for a possible liver transplant.   He recently presented to [facility] ED on 03/16/2021 for evaluation of lower back pain and was found to have an AKI with creatinine at 3.86 (previously 1.66 in 01/2021) and K+ at 6.6. CT Imaging also consistent with acute pancreatitis. Also hypotensive on admission. He was started on IVF and Insulin drip at the time of admission.   Labs continue to improve with K+ at 5.4 this AM and creatinine at 2.48. Lactic Acid at 2.1 on admission and improved to 1.2 today. BP has remained soft and he has been restarted on Midodrine 10mg  TID along with being on low-dose Levophed for BP support. Agree with holding PTA Spironolactone until renal function and BP stabilize. Will ultimately require diuresis as weight was at 264 lbs in 02/2021 and recorded at 283 lbs today.   Went to visit with the patient who reports he is overall feeling much better. No family at the bedside. Reviewed with Dr. Harl Bowie and we will continue to follow peripherally for now.  Signed, Erma Heritage, PA-C 03/18/2021, 10:42 AM Pager: [phone]

## 2021-03-18 NOTE — Progress Notes (Signed)
PROGRESS NOTE    TANNOR PYON  TYO:[id number] DOB: Apr 18, 1961 DOA: 03/16/2021 PCP: [name], MD   Brief Narrative:   [name]  is a 60 y.o. male, with history of type 2 diabetes mellitus, thrombocytopenia, hypotension, hyperlipidemia, CKD, nonalcoholic cirrhosis, coronary artery disease status post CABG, and more presents to the ED with a chief complaint of back pain.  Patient was admitted with AKI as well as hyperkalemia and possible mild pancreatitis that was noted on CT, but he is otherwise asymptomatic from this.  He was also noted to have significant hyperglycemia and hypotension.  AKI is improving as well as his blood glucose levels.  Assessment & Plan:   Principal Problem:   AKI (acute kidney injury) ([place]) Active Problems:   Type 2 diabetes mellitus with hyperglycemia (HCC)   Hyponatremia   Hypotension   Hyperkalemia   Elevated troponin   Back pain   AKI-improving -Nonoliguric -Likely related to hepatorenal syndrome -Continue IV fluid for now -Recheck a.m. labs  Hyperkalemia -related to above -Give further dose of Lokelma today -Recheck in a.m.  Hyperglycemia in the setting of type 2 diabetes -Insulin drip now discontinued and patient now on SSI and long-acting -Carb modified diet  Elevated troponin -Likely related to AKI, no signs of ACS otherwise noted  Hyponatremia-resolved -Continue to monitor  Back pain -Likely musculoskeletal -Lidocaine patch for now  Questionable pancreatitis -Seen on CT scan, but lipase negative -Otherwise asymptomatic, continue to monitor  History of chronic diastolic CHF -Holding spironolactone for now, given above -Weight of 283lbs and baseline 264lbs -Will plan to diurese prior to DC  History of CAD and prior CABG 2016  History of hypertension -Continue midodrine and wean norepinephrine  History of liver cirrhosis -He is [facility] liver transplant list  DVT prophylaxis: Heparin Code Status:  Full Family Communication: Discussed with wife on phone 4/6 Disposition Plan:  Status is: Inpatient  Remains inpatient appropriate because:IV treatments appropriate due to intensity of illness or inability to take PO and Inpatient level of care appropriate due to severity of illness   Dispo: The patient is from: Home              Anticipated d/c is to: Home              Patient currently is not medically stable to d/c.   Difficult to place patient No   Consultants:   None  Procedures:   Right internal jugular central venous line 4/5  See below  Antimicrobials:  Anti-infectives (From admission, onward)   None       Subjective: Patient seen and evaluated today with no new acute complaints or concerns. No acute concerns or events noted overnight.  Objective: Vitals:   03/18/21 0415 03/18/21 0451 03/18/21 0829 03/18/21 1236  BP:      Pulse:      Resp:      Temp: 98.4 F (36.9 C)  98.6 F (37 C) 97.8 F (36.6 C)  TempSrc: Oral  Oral Axillary  SpO2:      Weight:  128.6 kg    Height:        Intake/Output Summary (Last 24 hours) at 03/18/2021 1533 Last data filed at 03/18/2021 1200 Gross per 24 hour  Intake 549.19 ml  Output 1777 ml  Net -1227.81 ml   Filed Weights   03/16/21 2212 03/18/21 0451  Weight: 120 kg 128.6 kg    Examination:  General exam: Appears calm and comfortable, obese Respiratory system: Clear to  auscultation. Respiratory effort normal. Cardiovascular system: S1 & S2 heard, RRR.  Gastrointestinal system: Abdomen is soft Central nervous system: Alert and awake Extremities: No edema Skin: No significant lesions noted Psychiatry: Flat affect.    Data Reviewed: I have personally reviewed following labs and imaging studies  CBC: Recent Labs  Lab 03/16/21 2332 03/17/21 0607 03/18/21 0905  WBC 8.0 5.1 6.5  NEUTROABS 5.2  --   --   HGB 10.3* 9.1* 10.3*  HCT 30.9* 28.0* 31.4*  MCV 102.7* 104.5* 102.3*  PLT 96* 79* 102*   Basic  Metabolic Panel: Recent Labs  Lab 03/16/21 2332 03/17/21 0131 03/17/21 0607 03/18/21 0633 03/18/21 0905  NA 125*  --  129* 135  --   K 6.6* 6.5* 5.3* 5.4*  --   CL 99  --  101 106  --   CO2 17*  --  17* 21*  --   GLUCOSE 322*  --  270* 110*  --   BUN 70*  --  71* 60*  --   CREATININE 3.86*  --  3.70* 2.48*  --   CALCIUM 8.3*  --  8.2* 8.7*  --   MG  --   --  2.8*  --  2.6*   GFR: Estimated Creatinine Clearance: 42.1 mL/min (A) (by C-G formula based on SCr of 2.48 mg/dL (H)). Liver Function Tests: Recent Labs  Lab 03/16/21 2332 03/17/21 0607  AST 31 31  ALT 18 15  ALKPHOS 51 42  BILITOT 1.3* 1.0  PROT 6.2* 5.6*  ALBUMIN 3.2* 2.9*   Recent Labs  Lab 03/16/21 2332 03/17/21 0607  LIPASE 37 32   Recent Labs  Lab 03/17/21 0607 03/18/21 0905  AMMONIA 78* 55*   Coagulation Profile: Recent Labs  Lab 03/16/21 2332  INR 1.1   Cardiac Enzymes: No results for input(s): CKTOTAL, CKMB, CKMBINDEX, TROPONINI in the last 168 hours. BNP (last 3 results) No results for input(s): PROBNP in the last 8760 hours. HbA1C: Recent Labs    03/17/21 0607  HGBA1C 7.7*   CBG: Recent Labs  Lab 03/17/21 1124 03/17/21 1651 03/17/21 2159 03/18/21 0745 03/18/21 1145  GLUCAP 181* 217* 190* 96 195*   Lipid Profile: No results for input(s): CHOL, HDL, LDLCALC, TRIG, CHOLHDL, LDLDIRECT in the last 72 hours. Thyroid Function Tests: No results for input(s): TSH, T4TOTAL, FREET4, T3FREE, THYROIDAB in the last 72 hours. Anemia Panel: No results for input(s): VITAMINB12, FOLATE, FERRITIN, TIBC, IRON, RETICCTPCT in the last 72 hours. Sepsis Labs: Recent Labs  Lab 03/17/21 0232 03/17/21 0607 03/18/21 0905  LATICACIDVEN 1.4 2.1* 1.2    Recent Results (from the past 240 hour(s))  Resp Panel by RT-PCR (Flu A&B, Covid) Nasopharyngeal Swab     Status: None   Collection Time: 03/17/21  1:28 AM   Specimen: Nasopharyngeal Swab; Nasopharyngeal(NP) swabs in vial transport medium   Result Value Ref Range Status   SARS Coronavirus 2 by RT PCR NEGATIVE NEGATIVE Final    Comment: (NOTE) SARS-CoV-2 target nucleic acids are NOT DETECTED.  The SARS-CoV-2 RNA is generally detectable in upper respiratory specimens during the acute phase of infection. The lowest concentration of SARS-CoV-2 viral copies this assay can detect is 138 copies/mL. A negative result does not preclude SARS-Cov-2 infection and should not be used as the sole basis for treatment or other patient management decisions. A negative result may occur with  improper specimen collection/handling, submission of specimen other than nasopharyngeal swab, presence of viral mutation(s) within the areas targeted by  this assay, and inadequate number of viral copies(<138 copies/mL). A negative result must be combined with clinical observations, patient history, and epidemiological information. The expected result is Negative.  Fact Sheet for Patients:  EntrepreneurPulse.com.au  Fact Sheet for Healthcare Providers:  IncredibleEmployment.be  This test is no t yet approved or cleared by the [place] FDA and  has been authorized for detection and/or diagnosis of SARS-CoV-2 by FDA under an Emergency Use Authorization (EUA). This EUA will remain  in effect (meaning this test can be used) for the duration of the COVID-19 declaration under Section 564(b)(1) of the Act, 21 U.S.C.section 360bbb-3(b)(1), unless the authorization is terminated  or revoked sooner.       Influenza A by PCR NEGATIVE NEGATIVE Final   Influenza B by PCR NEGATIVE NEGATIVE Final    Comment: (NOTE) The Xpert Xpress SARS-CoV-2/FLU/RSV plus assay is intended as an aid in the diagnosis of influenza from Nasopharyngeal swab specimens and should not be used as a sole basis for treatment. Nasal washings and aspirates are unacceptable for Xpert Xpress SARS-CoV-2/FLU/RSV testing.  Fact Sheet for  Patients: EntrepreneurPulse.com.au  Fact Sheet for Healthcare Providers: IncredibleEmployment.be  This test is not yet approved or cleared by the [place] FDA and has been authorized for detection and/or diagnosis of SARS-CoV-2 by FDA under an Emergency Use Authorization (EUA). This EUA will remain in effect (meaning this test can be used) for the duration of the COVID-19 declaration under Section 564(b)(1) of the Act, 21 U.S.C. section 360bbb-3(b)(1), unless the authorization is terminated or revoked.  Performed at [facility] [address], [place], [place] [address]   MRSA PCR Screening     Status: None   Collection Time: 03/17/21  3:37 AM   Specimen: Nasal Mucosa; Nasopharyngeal  Result Value Ref Range Status   MRSA by PCR NEGATIVE NEGATIVE Final    Comment:        The GeneXpert MRSA Assay (FDA approved for NASAL specimens only), is one component of a comprehensive MRSA colonization surveillance program. It is not intended to diagnose MRSA infection nor to guide or monitor treatment for MRSA infections. Performed at [facility], [address], [place], [place] [address]          Radiology Studies: CT CERVICAL SPINE WO CONTRAST  Result Date: 03/17/2021 CLINICAL DATA:  Neck pain EXAM: CT CERVICAL SPINE WITHOUT CONTRAST TECHNIQUE: Multidetector CT imaging of the cervical spine was performed without intravenous contrast. Multiplanar CT image reconstructions were also generated. COMPARISON:  None. FINDINGS: Alignment: No subluxation. Skull base and vertebrae: No acute fracture. No primary bone lesion or focal pathologic process. Soft tissues and spinal canal: No prevertebral fluid or swelling. No visible canal hematoma. Disc levels:  Disc spaces maintained. Upper chest: Negative Other: None IMPRESSION: No acute bony abnormality. Electronically Signed   By: Rolm Baptise M.D.   On: 03/17/2021 01:05   CT L-SPINE NO CHARGE  Result  Date: 03/17/2021 CLINICAL DATA:  Initial evaluation for acute severe lower back pain. EXAM: CT LUMBAR SPINE WITHOUT CONTRAST TECHNIQUE: Multidetector CT imaging of the lumbar spine was performed without intravenous contrast administration. Multiplanar CT image reconstructions were also generated. COMPARISON:  None available. FINDINGS: Segmentation: Standard. Lowest well-formed disc space labeled the L5-S1 level. Alignment: Trace anterolisthesis of L4 on L5, chronic and facet mediated. Alignment otherwise normal preservation of the normal lumbar lordosis. Vertebrae: Vertebral body height maintained without evidence for acute or chronic fracture. Visualized sacrum and pelvis intact. SI joints approximated symmetric. No discrete  or worrisome osseous lesions. Paraspinal and other soft tissues: Paraspinous soft tissues demonstrate no acute finding. Disc levels: L1-2:  Unremarkable. L2-3: Mild disc bulge, slightly eccentric to the right. No spinal stenosis. Foramina remain patent. L3-4: Mild diffuse disc bulge with annular calcification. Superimposed right foraminal to extraforaminal disc protrusion closely approximates and potentially affects the exiting right L3 nerve root (series 7, image 66). Superimposed mild facet hypertrophy. No more than mild spinal stenosis. Mild bilateral L3 foraminal narrowing. L4-5: Trace anterolisthesis. Diffuse disc bulge, slightly asymmetric to the right. Mild to moderate facet and ligament flavum hypertrophy. Resultant moderate canal with bilateral subarticular stenosis. Moderate right worse than left L4 foraminal narrowing. L5-S1: Mild disc bulge. Superimposed left foraminal disc protrusion closely approximates and could potentially affect the exiting left L5 nerve root (series 7, image 97). Mild to moderate bilateral facet hypertrophy. No significant canal or lateral recess stenosis. Mild right with moderate left L5 foraminal narrowing. IMPRESSION: 1. No acute osseous abnormality within  the lumbar spine. 2. Trace anterolisthesis of L4 on L5 with associated disc bulge and facet hypertrophy, resulting in moderate canal with bilateral subarticular stenosis. 3. Right foraminal to extraforaminal disc protrusion at L3-4, closely approximating and potentially affecting the exiting right L3 nerve root. 4. Left foraminal disc protrusion at L5-S1, potentially affecting the exiting left L5 nerve root. 5. Mild-to-moderate lower lumbar facet arthropathy. Finding could contribute to lower back pain. Electronically Signed   By: Jeannine Boga M.D.   On: 03/17/2021 01:13   DG CHEST PORT 1 VIEW  Result Date: 03/17/2021 CLINICAL DATA:  Central line placement EXAM: PORTABLE CHEST 1 VIEW COMPARISON:  12/09/2020 FINDINGS: Right IJ venous catheter terminates at the cavoatrial junction. Pulmonary vascular congestion without frank interstitial edema. No pleural effusion or pneumothorax. The heart is normal in size. Postsurgical changes related to prior CABG. Median sternotomy. IMPRESSION: Right IJ venous catheter terminates at the cavoatrial junction. No pneumothorax. Electronically Signed   By: Julian Hy M.D.   On: 03/17/2021 16:10   CT Renal Stone Study  Result Date: 03/17/2021 CLINICAL DATA:  Severe low back pain and neck pain.  No injury. EXAM: CT ABDOMEN AND PELVIS WITHOUT CONTRAST TECHNIQUE: Multidetector CT imaging of the abdomen and pelvis was performed following the standard protocol without IV contrast. COMPARISON:  05/16/2016 FINDINGS: Lower chest: Previous sternotomy.  Lung bases are clear. Hepatobiliary: Probable hepatic cirrhosis with nodular liver contour and enlarged lateral segment left lobe. No focal lesions are identified on noncontrast imaging. Cholelithiasis. No bile duct dilatation. Pancreas: Edema and stranding in the peripancreatic fat. No loculated collections. No pancreatic ductal dilatation. Spleen: Normal in size without focal abnormality. Adrenals/Urinary Tract: Adrenal  glands are unremarkable. Kidneys are normal, without renal calculi, focal lesion, or hydronephrosis. Bladder is unremarkable. Stomach/Bowel: Stomach, small bowel, and colon are not abnormally distended. No wall thickening or inflammatory changes appreciated. Appendix is not identified. Vascular/Lymphatic: Aortic atherosclerosis. No enlarged abdominal or pelvic lymph nodes. Reproductive: Prostate is unremarkable. Other: No free air or free fluid in the abdomen. Abdominal wall musculature appears intact. Musculoskeletal: Normal alignment of the lumbar spine. Degenerative changes most prominent in the lower thoracic spine. No destructive bone lesions. IMPRESSION: 1. Edema and stranding in the peripancreatic fat consistent with acute pancreatitis. No loculated collections. 2. Probable hepatic cirrhosis. 3. Cholelithiasis without evidence of cholecystitis. 4. Aortic atherosclerosis. Aortic Atherosclerosis (ICD10-I70.0). Electronically Signed   By: Lucienne Capers M.D.   On: 03/17/2021 00:49        Scheduled Meds: . aspirin  EC  81 mg Oral QHS  . Chlorhexidine Gluconate Cloth  6 each Topical Daily  . heparin  5,000 Units Subcutaneous Q8H  . insulin aspart  0-9 Units Subcutaneous TID WC  . insulin detemir  25 Units Subcutaneous BID  . lactulose  20 g Oral Daily  . lidocaine  1 patch Transdermal Q24H  . midodrine  10 mg Oral TID WC  . pantoprazole  40 mg Oral Daily  . sodium zirconium cyclosilicate  10 g Oral Once   Continuous Infusions: . lactated ringers 100 mL/hr at 03/17/21 1208  . norepinephrine (LEVOPHED) Adult infusion 6 mcg/min (03/18/21 0734)     LOS: 1 day    Time spent: 35 minutes    Antero Derosia D Manuella Ghazi, DO [facility]  If 7PM-7AM, please contact night-coverage [email or web address] 03/18/2021, 3:33 PM

## 2021-03-19 ENCOUNTER — Ambulatory Visit: Payer: Federal, State, Local not specified - PPO | Admitting: Adult Health

## 2021-03-19 DIAGNOSIS — N179 Acute kidney failure, unspecified: Secondary | ICD-10-CM | POA: Diagnosis not present

## 2021-03-19 LAB — CBC
HCT: 26.7 % — ABNORMAL LOW (ref 39.0–52.0)
Hemoglobin: 8.8 g/dL — ABNORMAL LOW (ref 13.0–17.0)
MCH: 33.5 pg (ref 26.0–34.0)
MCHC: 33 g/dL (ref 30.0–36.0)
MCV: 101.5 fL — ABNORMAL HIGH (ref 80.0–100.0)
Platelets: 74 10*3/uL — ABNORMAL LOW (ref 150–400)
RBC: 2.63 MIL/uL — ABNORMAL LOW (ref 4.22–5.81)
RDW: 15.9 % — ABNORMAL HIGH (ref 11.5–15.5)
WBC: 6.2 10*3/uL (ref 4.0–10.5)
nRBC: 0 % (ref 0.0–0.2)

## 2021-03-19 LAB — MAGNESIUM: Magnesium: 2.2 mg/dL (ref 1.7–2.4)

## 2021-03-19 LAB — COMPREHENSIVE METABOLIC PANEL
ALT: 16 U/L (ref 0–44)
AST: 34 U/L (ref 15–41)
Albumin: 2.8 g/dL — ABNORMAL LOW (ref 3.5–5.0)
Alkaline Phosphatase: 39 U/L (ref 38–126)
Anion gap: 8 (ref 5–15)
BUN: 41 mg/dL — ABNORMAL HIGH (ref 8–23)
CO2: 22 mmol/L (ref 22–32)
Calcium: 8.8 mg/dL — ABNORMAL LOW (ref 8.9–10.3)
Chloride: 104 mmol/L (ref 98–111)
Creatinine, Ser: 1.85 mg/dL — ABNORMAL HIGH (ref 0.61–1.24)
GFR, Estimated: 41 mL/min — ABNORMAL LOW (ref >60)
Glucose, Bld: 167 mg/dL — ABNORMAL HIGH (ref 70–99)
Potassium: 4.9 mmol/L (ref 3.5–5.1)
Sodium: 134 mmol/L — ABNORMAL LOW (ref 135–145)
Total Bilirubin: 1.5 mg/dL — ABNORMAL HIGH (ref 0.3–1.2)
Total Protein: 5.7 g/dL — ABNORMAL LOW (ref 6.5–8.1)

## 2021-03-19 LAB — GLUCOSE, CAPILLARY
Glucose-Capillary: 159 mg/dL — ABNORMAL HIGH (ref 70–99)
Glucose-Capillary: 258 mg/dL — ABNORMAL HIGH (ref 70–99)
Glucose-Capillary: 348 mg/dL — ABNORMAL HIGH (ref 70–99)
Glucose-Capillary: 461 mg/dL — ABNORMAL HIGH (ref 70–99)

## 2021-03-19 MED ORDER — DEXAMETHASONE SODIUM PHOSPHATE 4 MG/ML IJ SOLN
4.0000 mg | Freq: Two times a day (BID) | INTRAMUSCULAR | Status: DC
  Administered 2021-03-19 – 2021-03-21 (×6): 4 mg via INTRAVENOUS
  Filled 2021-03-19 (×6): qty 1

## 2021-03-19 MED ORDER — INSULIN ASPART 100 UNIT/ML ~~LOC~~ SOLN
4.0000 [IU] | Freq: Three times a day (TID) | SUBCUTANEOUS | Status: DC
  Administered 2021-03-19 – 2021-03-20 (×2): 4 [IU] via SUBCUTANEOUS

## 2021-03-19 NOTE — Progress Notes (Signed)
PROGRESS NOTE    [name]  KYH:[id number] DOB: 1961/01/02 DOA: 03/16/2021 PCP: [name], MD   Brief Narrative:   [name] a59 y.o.male,with history of type 2 diabetes mellitus, thrombocytopenia, hypotension, hyperlipidemia, CKD, nonalcoholic cirrhosis, coronary artery disease status post CABG, and more presents to the ED with a chief complaint of back pain.  Patient was admitted with AKI as well as hyperkalemia and possible mild pancreatitis that was noted on CT, but he is otherwise asymptomatic from this.  He was also noted to have significant hyperglycemia and hypotension.  AKI is improving as well as his blood glucose levels.  Assessment & Plan:   Principal Problem:   AKI (acute kidney injury) ([place]) Active Problems:   Type 2 diabetes mellitus with hyperglycemia (HCC)   Hyponatremia   Hypotension   Hyperkalemia   Elevated troponin   Back pain   AKI-improving -Nonoliguric -Likely related to hepatorenal syndrome -Continue IV fluid for now -Recheck a.m. labs  Ongoing hypotension -Continue to wean norepinephrine -Continue midodrine -Check cortisol level and consider hydrocortisone initiation after -Start dexamethasone 4 mg IV twice daily for now  Hyperglycemia in the setting of type 2 diabetes -Insulin drip now discontinued and patient now on SSI and long-acting -Carb modified diet -Added NovoLog 4 units 3 times daily per coordinator recommendations  Elevated troponin -Likely related to AKI, no signs of ACS otherwise noted  Hyponatremia-resolved -Continue to monitor  Back pain-resolved -Likely musculoskeletal -Lidocaine patch for now  Questionable pancreatitis -Seen on CT scan, but lipase negative -Otherwise asymptomatic, continue to monitor  History of chronic diastolic CHF -Holding spironolactone for now, given above -Weight of 280lbs and baseline 264lbs -Will plan to diurese prior to DC once off pressors  History of  CAD and prior CABG 2016  History of hypertension -Continue midodrine and wean norepinephrine  History of liver cirrhosis -He is [facility] liver transplant list  DVT prophylaxis: Heparin Code Status: Full Family Communication: Discussed with wife at bedside 4/7 Disposition Plan:  Status is: Inpatient  Remains inpatient appropriate because:IV treatments appropriate due to intensity of illness or inability to take PO and Inpatient level of care appropriate due to severity of illness   Dispo: The patient is from: Home  Anticipated d/c is to: Home  Patient currently is not medically stable to d/c.              Difficult to place patient No   Consultants:   None  Procedures:   Right internal jugular central venous line 4/5  See below  Antimicrobials:  Anti-infectives (From admission, onward)   None        Subjective: Patient seen and evaluated today with no new acute complaints or concerns. No acute concerns or events noted overnight.  He continues to remain on norepinephrine that is being slowly weaned.  Objective: Vitals:   03/19/21 1130 03/19/21 1145 03/19/21 1200 03/19/21 1215  BP: (!) 91/37 (!) 119/52 (!) 109/46 (!) 100/50  Pulse:      Resp: 20 (!) 23 (!) 26 (!) 23  Temp:  97.8 F (36.6 C)    TempSrc:  Oral    SpO2:      Weight:      Height:        Intake/Output Summary (Last 24 hours) at 03/19/2021 1326 Last data filed at 03/19/2021 0900 Gross per 24 hour  Intake --  Output 2003 ml  Net -2003 ml   Filed Weights   03/16/21 2212 03/18/21 0451 03/19/21 0534  Weight:  120 kg 128.6 kg 127.1 kg    Examination:  General exam: Appears calm and comfortable, obese Respiratory system: Clear to auscultation. Respiratory effort normal. Cardiovascular system: S1 & S2 heard, RRR.  Gastrointestinal system: Abdomen is soft Central nervous system: Alert and awake Extremities: No edema Skin: No significant lesions noted Psychiatry:  Flat affect.    Data Reviewed: I have personally reviewed following labs and imaging studies  CBC: Recent Labs  Lab 03/16/21 2332 03/17/21 0607 03/18/21 0905 03/19/21 0636  WBC 8.0 5.1 6.5 6.2  NEUTROABS 5.2  --   --   --   HGB 10.3* 9.1* 10.3* 8.8*  HCT 30.9* 28.0* 31.4* 26.7*  MCV 102.7* 104.5* 102.3* 101.5*  PLT 96* 79* 102* 74*   Basic Metabolic Panel: Recent Labs  Lab 03/16/21 2332 03/17/21 0131 03/17/21 0607 03/18/21 0633 03/18/21 0905 03/19/21 0636  NA 125*  --  129* 135  --  134*  K 6.6* 6.5* 5.3* 5.4*  --  4.9  CL 99  --  101 106  --  104  CO2 17*  --  17* 21*  --  22  GLUCOSE 322*  --  270* 110*  --  167*  BUN 70*  --  71* 60*  --  41*  CREATININE 3.86*  --  3.70* 2.48*  --  1.85*  CALCIUM 8.3*  --  8.2* 8.7*  --  8.8*  MG  --   --  2.8*  --  2.6* 2.2   GFR: Estimated Creatinine Clearance: 56.1 mL/min (A) (by C-G formula based on SCr of 1.85 mg/dL (H)). Liver Function Tests: Recent Labs  Lab 03/16/21 2332 03/17/21 0607 03/19/21 0636  AST 31 31 34  ALT 18 15 16   ALKPHOS 51 42 39  BILITOT 1.3* 1.0 1.5*  PROT 6.2* 5.6* 5.7*  ALBUMIN 3.2* 2.9* 2.8*   Recent Labs  Lab 03/16/21 2332 03/17/21 0607  LIPASE 37 32   Recent Labs  Lab 03/17/21 0607 03/18/21 0905  AMMONIA 78* 55*   Coagulation Profile: Recent Labs  Lab 03/16/21 2332  INR 1.1   Cardiac Enzymes: No results for input(s): CKTOTAL, CKMB, CKMBINDEX, TROPONINI in the last 168 hours. BNP (last 3 results) No results for input(s): PROBNP in the last 8760 hours. HbA1C: Recent Labs    03/17/21 0607  HGBA1C 7.7*   CBG: Recent Labs  Lab 03/18/21 1145 03/18/21 1647 03/18/21 2014 03/19/21 0754 03/19/21 1118  GLUCAP 195* 275* 272* 159* 258*   Lipid Profile: No results for input(s): CHOL, HDL, LDLCALC, TRIG, CHOLHDL, LDLDIRECT in the last 72 hours. Thyroid Function Tests: No results for input(s): TSH, T4TOTAL, FREET4, T3FREE, THYROIDAB in the last 72 hours. Anemia Panel: No  results for input(s): VITAMINB12, FOLATE, FERRITIN, TIBC, IRON, RETICCTPCT in the last 72 hours. Sepsis Labs: Recent Labs  Lab 03/17/21 0232 03/17/21 0607 03/18/21 0905  LATICACIDVEN 1.4 2.1* 1.2    Recent Results (from the past 240 hour(s))  Resp Panel by RT-PCR (Flu A&B, Covid) Nasopharyngeal Swab     Status: None   Collection Time: 03/17/21  1:28 AM   Specimen: Nasopharyngeal Swab; Nasopharyngeal(NP) swabs in vial transport medium  Result Value Ref Range Status   SARS Coronavirus 2 by RT PCR NEGATIVE NEGATIVE Final    Comment: (NOTE) SARS-CoV-2 target nucleic acids are NOT DETECTED.  The SARS-CoV-2 RNA is generally detectable in upper respiratory specimens during the acute phase of infection. The lowest concentration of SARS-CoV-2 viral copies this assay can detect is 138  copies/mL. A negative result does not preclude SARS-Cov-2 infection and should not be used as the sole basis for treatment or other patient management decisions. A negative result may occur with  improper specimen collection/handling, submission of specimen other than nasopharyngeal swab, presence of viral mutation(s) within the areas targeted by this assay, and inadequate number of viral copies(<138 copies/mL). A negative result must be combined with clinical observations, patient history, and epidemiological information. The expected result is Negative.  Fact Sheet for Patients:  EntrepreneurPulse.com.au  Fact Sheet for Healthcare Providers:  IncredibleEmployment.be  This test is no t yet approved or cleared by the [place] FDA and  has been authorized for detection and/or diagnosis of SARS-CoV-2 by FDA under an Emergency Use Authorization (EUA). This EUA will remain  in effect (meaning this test can be used) for the duration of the COVID-19 declaration under Section 564(b)(1) of the Act, 21 U.S.C.section 360bbb-3(b)(1), unless the authorization is terminated   or revoked sooner.       Influenza A by PCR NEGATIVE NEGATIVE Final   Influenza B by PCR NEGATIVE NEGATIVE Final    Comment: (NOTE) The Xpert Xpress SARS-CoV-2/FLU/RSV plus assay is intended as an aid in the diagnosis of influenza from Nasopharyngeal swab specimens and should not be used as a sole basis for treatment. Nasal washings and aspirates are unacceptable for Xpert Xpress SARS-CoV-2/FLU/RSV testing.  Fact Sheet for Patients: EntrepreneurPulse.com.au  Fact Sheet for Healthcare Providers: IncredibleEmployment.be  This test is not yet approved or cleared by the [place] FDA and has been authorized for detection and/or diagnosis of SARS-CoV-2 by FDA under an Emergency Use Authorization (EUA). This EUA will remain in effect (meaning this test can be used) for the duration of the COVID-19 declaration under Section 564(b)(1) of the Act, 21 U.S.C. section 360bbb-3(b)(1), unless the authorization is terminated or revoked.  Performed at [facility], [address], [place], [place] [address]   MRSA PCR Screening     Status: None   Collection Time: 03/17/21  3:37 AM   Specimen: Nasal Mucosa; Nasopharyngeal  Result Value Ref Range Status   MRSA by PCR NEGATIVE NEGATIVE Final    Comment:        The GeneXpert MRSA Assay (FDA approved for NASAL specimens only), is one component of a comprehensive MRSA colonization surveillance program. It is not intended to diagnose MRSA infection nor to guide or monitor treatment for MRSA infections. Performed at [facility], [address] [place], [place] [address]          Radiology Studies: DG CHEST PORT 1 VIEW  Result Date: 03/17/2021 CLINICAL DATA:  Central line placement EXAM: PORTABLE CHEST 1 VIEW COMPARISON:  12/09/2020 FINDINGS: Right IJ venous catheter terminates at the cavoatrial junction. Pulmonary vascular congestion without frank interstitial edema. No pleural effusion or  pneumothorax. The heart is normal in size. Postsurgical changes related to prior CABG. Median sternotomy. IMPRESSION: Right IJ venous catheter terminates at the cavoatrial junction. No pneumothorax. Electronically Signed   By: Julian Hy M.D.   On: 03/17/2021 16:10        Scheduled Meds: . aspirin EC  81 mg Oral QHS  . Chlorhexidine Gluconate Cloth  6 each Topical Daily  . heparin  5,000 Units Subcutaneous Q8H  . insulin aspart  0-9 Units Subcutaneous TID WC  . insulin aspart  4 Units Subcutaneous TID WC  . insulin detemir  25 Units Subcutaneous BID  . lactulose  20 g Oral Daily  . lidocaine  1 patch Transdermal Q24H  . midodrine  10 mg Oral TID WC  . pantoprazole  40 mg Oral Daily   Continuous Infusions: . norepinephrine (LEVOPHED) Adult infusion 4 mcg/min (03/19/21 1034)     LOS: 2 days    Time spent: 35 minutes    Tavares Levinson Darleen Crocker, DO [facility]  If 7PM-7AM, please contact night-coverage [email or web address] 03/19/2021, 1:26 PM

## 2021-03-19 NOTE — Progress Notes (Signed)
Inpatient Diabetes Program Recommendations  AACE/ADA: New Consensus Statement on Inpatient Glycemic Control (2015)  Target Ranges:  Prepandial:   less than 140 mg/dL      Peak postprandial:   less than 180 mg/dL (1-2 hours)      Critically ill patients:  140 - 180 mg/dL   Lab Results  Component Value Date   GLUCAP 159 (H) 03/19/2021   HGBA1C 7.7 (H) 03/17/2021    Review of Glycemic Control Results for JOVANN, LUSE" (MRN [id number]) as of 03/19/2021 09:51  Ref. Range 03/18/2021 07:45 03/18/2021 11:45 03/18/2021 16:47 03/18/2021 20:14 03/19/2021 07:54  Glucose-Capillary Latest Ref Range: 70 - 99 mg/dL 96 195 (H) 275 (H) 272 (H) 159 (H)   Diabetes history: DM 2 Outpatient Diabetes medications:  Novolog 5 units tid with meals Tresiba 40 units q AM Ozempic 1 mg weekly Current orders for Inpatient glycemic control:  Novolog sensitive tid with meals Levemir 25 units bid Inpatient Diabetes Program Recommendations:   Please consider adding Novolog meal coverage 4 units tid with meals (hold if patient eats less than 50% or NPO).    Thanks,  Adah Perl, RN, BC-ADM Inpatient Diabetes Coordinator Pager [phone] (8a-5p)

## 2021-03-20 DIAGNOSIS — N179 Acute kidney failure, unspecified: Secondary | ICD-10-CM | POA: Diagnosis not present

## 2021-03-20 LAB — CBC
HCT: 25.4 % — ABNORMAL LOW (ref 39.0–52.0)
Hemoglobin: 8.6 g/dL — ABNORMAL LOW (ref 13.0–17.0)
MCH: 33.5 pg (ref 26.0–34.0)
MCHC: 33.9 g/dL (ref 30.0–36.0)
MCV: 98.8 fL (ref 80.0–100.0)
Platelets: 55 10*3/uL — ABNORMAL LOW (ref 150–400)
RBC: 2.57 MIL/uL — ABNORMAL LOW (ref 4.22–5.81)
RDW: 14.9 % (ref 11.5–15.5)
WBC: 4.4 10*3/uL (ref 4.0–10.5)
nRBC: 0 % (ref 0.0–0.2)

## 2021-03-20 LAB — GLUCOSE, CAPILLARY
Glucose-Capillary: 328 mg/dL — ABNORMAL HIGH (ref 70–99)
Glucose-Capillary: 394 mg/dL — ABNORMAL HIGH (ref 70–99)
Glucose-Capillary: 381 mg/dL — ABNORMAL HIGH (ref 70–99)
Glucose-Capillary: 402 mg/dL — ABNORMAL HIGH (ref 70–99)
Glucose-Capillary: 360 mg/dL — ABNORMAL HIGH (ref 70–99)

## 2021-03-20 LAB — BASIC METABOLIC PANEL
Anion gap: 8 (ref 5–15)
BUN: 34 mg/dL — ABNORMAL HIGH (ref 8–23)
CO2: 20 mmol/L — ABNORMAL LOW (ref 22–32)
Calcium: 8.7 mg/dL — ABNORMAL LOW (ref 8.9–10.3)
Chloride: 99 mmol/L (ref 98–111)
Creatinine, Ser: 1.58 mg/dL — ABNORMAL HIGH (ref 0.61–1.24)
GFR, Estimated: 49 mL/min — ABNORMAL LOW (ref >60)
Glucose, Bld: 361 mg/dL — ABNORMAL HIGH (ref 70–99)
Potassium: 5.4 mmol/L — ABNORMAL HIGH (ref 3.5–5.1)
Sodium: 127 mmol/L — ABNORMAL LOW (ref 135–145)

## 2021-03-20 LAB — MAGNESIUM: Magnesium: 1.8 mg/dL (ref 1.7–2.4)

## 2021-03-20 LAB — CORTISOL-AM, BLOOD: Cortisol - AM: 1.4 ug/dL — ABNORMAL LOW (ref 6.7–22.6)

## 2021-03-20 MED ORDER — LOPERAMIDE HCL 2 MG PO CAPS
2.0000 mg | ORAL_CAPSULE | ORAL | Status: DC | PRN
  Administered 2021-03-20: 2 mg via ORAL
  Filled 2021-03-20: qty 1

## 2021-03-20 MED ORDER — INSULIN DETEMIR 100 UNIT/ML ~~LOC~~ SOLN
30.0000 [IU] | Freq: Two times a day (BID) | SUBCUTANEOUS | Status: DC
  Administered 2021-03-20 – 2021-03-24 (×8): 30 [IU] via SUBCUTANEOUS
  Filled 2021-03-20 (×10): qty 0.3

## 2021-03-20 MED ORDER — INSULIN ASPART 100 UNIT/ML ~~LOC~~ SOLN
0.0000 [IU] | Freq: Three times a day (TID) | SUBCUTANEOUS | Status: DC
  Administered 2021-03-20: 20 [IU] via SUBCUTANEOUS

## 2021-03-20 MED ORDER — INSULIN ASPART 100 UNIT/ML ~~LOC~~ SOLN
6.0000 [IU] | Freq: Three times a day (TID) | SUBCUTANEOUS | Status: DC
  Administered 2021-03-20 – 2021-03-21 (×2): 6 [IU] via SUBCUTANEOUS

## 2021-03-20 MED ORDER — INSULIN DETEMIR 100 UNIT/ML ~~LOC~~ SOLN
28.0000 [IU] | Freq: Two times a day (BID) | SUBCUTANEOUS | Status: DC
  Administered 2021-03-20: 28 [IU] via SUBCUTANEOUS
  Filled 2021-03-20 (×7): qty 0.28

## 2021-03-20 MED ORDER — SODIUM CHLORIDE 0.9 % IV SOLN: INTRAVENOUS | Status: DC

## 2021-03-20 MED ORDER — INSULIN ASPART 100 UNIT/ML ~~LOC~~ SOLN: 0.0000 [IU] | Freq: Every day | SUBCUTANEOUS | Status: DC

## 2021-03-20 MED ORDER — INSULIN ASPART 100 UNIT/ML ~~LOC~~ SOLN
4.0000 [IU] | Freq: Three times a day (TID) | SUBCUTANEOUS | Status: DC
  Administered 2021-03-20: 4 [IU] via SUBCUTANEOUS

## 2021-03-20 MED ORDER — INSULIN ASPART 100 UNIT/ML ~~LOC~~ SOLN
0.0000 [IU] | SUBCUTANEOUS | Status: DC
  Administered 2021-03-20 – 2021-03-21 (×3): 20 [IU] via SUBCUTANEOUS
  Administered 2021-03-21: 11 [IU] via SUBCUTANEOUS
  Administered 2021-03-21: 30 [IU] via SUBCUTANEOUS
  Administered 2021-03-21: 11 [IU] via SUBCUTANEOUS
  Administered 2021-03-21: 4 [IU] via SUBCUTANEOUS
  Administered 2021-03-21: 7 [IU] via SUBCUTANEOUS
  Administered 2021-03-22: 4 [IU] via SUBCUTANEOUS
  Administered 2021-03-22: 11 [IU] via SUBCUTANEOUS
  Administered 2021-03-22 (×2): 7 [IU] via SUBCUTANEOUS
  Administered 2021-03-22: 3 [IU] via SUBCUTANEOUS
  Administered 2021-03-22 – 2021-03-23 (×2): 4 [IU] via SUBCUTANEOUS
  Administered 2021-03-23: 3 [IU] via SUBCUTANEOUS
  Administered 2021-03-23 (×2): 4 [IU] via SUBCUTANEOUS

## 2021-03-20 MED ORDER — HYDROCORTISONE 10 MG PO TABS
20.0000 mg | ORAL_TABLET | Freq: Two times a day (BID) | ORAL | Status: DC
  Administered 2021-03-20 – 2021-03-21 (×3): 20 mg via ORAL
  Filled 2021-03-20 (×7): qty 2

## 2021-03-20 NOTE — Progress Notes (Signed)
Levophed has been stopped since 0736, BP's have been resolving slowly on their own. Will continue to monitor.

## 2021-03-20 NOTE — Progress Notes (Signed)
Pt in bed, A/O, denies any pain at this time. Wife was at bed side during shift change pt's wife verbalized concern about pt's bp being low. I demonstrated that due to cuff malpositioning a false bp was displayed and reassessed the pt's bp following.

## 2021-03-20 NOTE — Progress Notes (Addendum)
PROGRESS NOTE    [name]  YTK:[id number] DOB: 1961/05/28 DOA: 03/16/2021 PCP: [name], MD   Brief Narrative:   [name] a59 y.o.male,with history of type 2 diabetes mellitus, thrombocytopenia, hypotension, hyperlipidemia, CKD, nonalcoholic cirrhosis, coronary artery disease status post CABG, and more presents to the ED with a chief complaint of back pain.Patient was admitted with AKI as well as hyperkalemia and possible mild pancreatitis that was noted on CT, but he is otherwise asymptomatic from this. He was also noted to have significant hyperglycemia and hypotension. AKI is improving and blood glucose levels continue to fluctuate with the addition of steroids.  Norepinephrine has been weaned.  Assessment & Plan:   Principal Problem:   AKI (acute kidney injury) ([place]) Active Problems:   Type 2 diabetes mellitus with hyperglycemia (HCC)   Hyponatremia   Hypotension   Hyperkalemia   Elevated troponin   Back pain   AKI-improving -Nonoliguric -Likely related to hepatorenal syndrome -Continue IV fluid for now -Recheck a.m. labs  Ongoing hypotension -Likely related to adrenal insufficiency -Norepinephrine has been weaned off, continue to monitor in ICU for now -Continue midodrine -Cortisol level very low at 1.4 -Continue dexamethasone 4 mg IV twice daily for now and plan to start while hydrocortisone which should take effect in the next 1-2 days at which point IV dexamethasone can be weaned hopefully to off  Hyperglycemia in the setting of type 2 diabetes -Insulin drip now discontinued and patientnow onSSI and long-acting -Carb modified diet -Adjusted insulin dosages with aggressive SSI given use of dexamethasone  Elevated troponin -Likely related to AKI, no signs of ACS otherwise noted  Hyponatremia-ongoing -Likely related to adrenal insufficiency -Continue to monitor -Trial of IV fluid normal saline  Back  pain-resolved -Likely musculoskeletal -Lidocaine patch for now  Diarrhea -May be related to pancreatic insufficiency, may need to try low-fat diet and Creon -No recent antibiotic use or abdominal pain noted -Plan to check GI panel -Imodium as needed  History of chronic diastolic CHF -Holding spironolactone for now, given above -Weight of 278lbs and baseline 264lbs -Will plan to diurese prior to DC once off pressors  History of CAD and prior CABG 2016  History of hypertension -Continue midodrine and wean norepinephrine -Noted to have adrenal insufficiency as noted above and will maintain on steroids  History of liver cirrhosis -He is [facility] liver transplant list  DVT prophylaxis:Heparin Code Status:Full Family Communication:Discussed with wife on the phone 4/8 Disposition Plan: Status is: Inpatient  Remains inpatient appropriate because:IV treatments appropriate due to intensity of illness or inability to take PO and Inpatient level of care appropriate due to severity of illness   Dispo: The patient is from:Home Anticipated d/c is FT:[facility] Patient currently is not medically stable to d/c. Difficult to place patient No   Consultants:  None  Procedures:  Right internal jugular central venous line 4/5  See below  Antimicrobials:   None   Subjective: Patient seen and evaluated today with some loose stools and frequent bowel movements noted overnight.  Blood pressure has improved and patient has been weaned off of norepinephrine.  Objective: Vitals:   03/20/21 1000 03/20/21 1030 03/20/21 1102 03/20/21 1120  BP: 106/70 (!) 100/54 (!) 101/39   Pulse: 66 60 68   Resp: 15 14 (!) 23   Temp:    (!) 97.4 F (36.3 C)  TempSrc:    Oral  SpO2: 100% 99% 100%   Weight:      Height:  Intake/Output Summary (Last 24 hours) at 03/20/2021 1238 Last data filed at 03/20/2021 1129 Gross per 24 hour  Intake  1703.27 ml  Output 4900 ml  Net -3196.73 ml   Filed Weights   03/18/21 0451 03/19/21 0534 03/20/21 0436  Weight: 128.6 kg 127.1 kg 126.1 kg    Examination:  General exam: Appears calm and comfortable, obese  Respiratory system: Clear to auscultation. Respiratory effort normal. Cardiovascular system: S1 & S2 heard, RRR.  Gastrointestinal system: Abdomen is soft Central nervous system: Alert and awake Extremities: No edema Skin: No significant lesions noted Psychiatry: Flat affect.    Data Reviewed: I have personally reviewed following labs and imaging studies  CBC: Recent Labs  Lab 03/16/21 2332 03/17/21 0607 03/18/21 0905 03/19/21 0636 03/20/21 0438  WBC 8.0 5.1 6.5 6.2 4.4  NEUTROABS 5.2  --   --   --   --   HGB 10.3* 9.1* 10.3* 8.8* 8.6*  HCT 30.9* 28.0* 31.4* 26.7* 25.4*  MCV 102.7* 104.5* 102.3* 101.5* 98.8  PLT 96* 79* 102* 74* 55*   Basic Metabolic Panel: Recent Labs  Lab 03/16/21 2332 03/17/21 0131 03/17/21 0607 03/18/21 0633 03/18/21 0905 03/19/21 0636 03/20/21 0438  NA 125*  --  129* 135  --  134* 127*  K 6.6* 6.5* 5.3* 5.4*  --  4.9 5.4*  CL 99  --  101 106  --  104 99  CO2 17*  --  17* 21*  --  22 20*  GLUCOSE 322*  --  270* 110*  --  167* 361*  BUN 70*  --  71* 60*  --  41* 34*  CREATININE 3.86*  --  3.70* 2.48*  --  1.85* 1.58*  CALCIUM 8.3*  --  8.2* 8.7*  --  8.8* 8.7*  MG  --   --  2.8*  --  2.6* 2.2 1.8   GFR: Estimated Creatinine Clearance: 65.4 mL/min (A) (by C-G formula based on SCr of 1.58 mg/dL (H)). Liver Function Tests: Recent Labs  Lab 03/16/21 2332 03/17/21 0607 03/19/21 0636  AST 31 31 34  ALT 18 15 16   ALKPHOS 51 42 39  BILITOT 1.3* 1.0 1.5*  PROT 6.2* 5.6* 5.7*  ALBUMIN 3.2* 2.9* 2.8*   Recent Labs  Lab 03/16/21 2332 03/17/21 0607  LIPASE 37 32   Recent Labs  Lab 03/17/21 0607 03/18/21 0905  AMMONIA 78* 55*   Coagulation Profile: Recent Labs  Lab 03/16/21 2332  INR 1.1   Cardiac Enzymes: No  results for input(s): CKTOTAL, CKMB, CKMBINDEX, TROPONINI in the last 168 hours. BNP (last 3 results) No results for input(s): PROBNP in the last 8760 hours. HbA1C: No results for input(s): HGBA1C in the last 72 hours. CBG: Recent Labs  Lab 03/19/21 1118 03/19/21 1653 03/19/21 2001 03/20/21 0732 03/20/21 1119  GLUCAP 258* 348* 461* 328* 394*   Lipid Profile: No results for input(s): CHOL, HDL, LDLCALC, TRIG, CHOLHDL, LDLDIRECT in the last 72 hours. Thyroid Function Tests: No results for input(s): TSH, T4TOTAL, FREET4, T3FREE, THYROIDAB in the last 72 hours. Anemia Panel: No results for input(s): VITAMINB12, FOLATE, FERRITIN, TIBC, IRON, RETICCTPCT in the last 72 hours. Sepsis Labs: Recent Labs  Lab 03/17/21 0232 03/17/21 0607 03/18/21 0905  LATICACIDVEN 1.4 2.1* 1.2    Recent Results (from the past 240 hour(s))  Resp Panel by RT-PCR (Flu A&B, Covid) Nasopharyngeal Swab     Status: None   Collection Time: 03/17/21  1:28 AM   Specimen: Nasopharyngeal Swab; Nasopharyngeal(NP)  swabs in vial transport medium  Result Value Ref Range Status   SARS Coronavirus 2 by RT PCR NEGATIVE NEGATIVE Final    Comment: (NOTE) SARS-CoV-2 target nucleic acids are NOT DETECTED.  The SARS-CoV-2 RNA is generally detectable in upper respiratory specimens during the acute phase of infection. The lowest concentration of SARS-CoV-2 viral copies this assay can detect is 138 copies/mL. A negative result does not preclude SARS-Cov-2 infection and should not be used as the sole basis for treatment or other patient management decisions. A negative result may occur with  improper specimen collection/handling, submission of specimen other than nasopharyngeal swab, presence of viral mutation(s) within the areas targeted by this assay, and inadequate number of viral copies(<138 copies/mL). A negative result must be combined with clinical observations, patient history, and epidemiological information. The  expected result is Negative.  Fact Sheet for Patients:  EntrepreneurPulse.com.au  Fact Sheet for Healthcare Providers:  IncredibleEmployment.be  This test is no t yet approved or cleared by the [place] FDA and  has been authorized for detection and/or diagnosis of SARS-CoV-2 by FDA under an Emergency Use Authorization (EUA). This EUA will remain  in effect (meaning this test can be used) for the duration of the COVID-19 declaration under Section 564(b)(1) of the Act, 21 U.S.C.section 360bbb-3(b)(1), unless the authorization is terminated  or revoked sooner.       Influenza A by PCR NEGATIVE NEGATIVE Final   Influenza B by PCR NEGATIVE NEGATIVE Final    Comment: (NOTE) The Xpert Xpress SARS-CoV-2/FLU/RSV plus assay is intended as an aid in the diagnosis of influenza from Nasopharyngeal swab specimens and should not be used as a sole basis for treatment. Nasal washings and aspirates are unacceptable for Xpert Xpress SARS-CoV-2/FLU/RSV testing.  Fact Sheet for Patients: EntrepreneurPulse.com.au  Fact Sheet for Healthcare Providers: IncredibleEmployment.be  This test is not yet approved or cleared by the [place] FDA and has been authorized for detection and/or diagnosis of SARS-CoV-2 by FDA under an Emergency Use Authorization (EUA). This EUA will remain in effect (meaning this test can be used) for the duration of the COVID-19 declaration under Section 564(b)(1) of the Act, 21 U.S.C. section 360bbb-3(b)(1), unless the authorization is terminated or revoked.  Performed at [facility], [address], [place], [place] [address]   MRSA PCR Screening     Status: None   Collection Time: 03/17/21  3:37 AM   Specimen: Nasal Mucosa; Nasopharyngeal  Result Value Ref Range Status   MRSA by PCR NEGATIVE NEGATIVE Final    Comment:        The GeneXpert MRSA Assay (FDA approved for NASAL  specimens only), is one component of a comprehensive MRSA colonization surveillance program. It is not intended to diagnose MRSA infection nor to guide or monitor treatment for MRSA infections. Performed at [facility], [address] [place], [place] [address]          Radiology Studies: No results found.      Scheduled Meds: . aspirin EC  81 mg Oral QHS  . Chlorhexidine Gluconate Cloth  6 each Topical Daily  . dexamethasone (DECADRON) injection  4 mg Intravenous Q12H  . heparin  5,000 Units Subcutaneous Q8H  . hydrocortisone  20 mg Oral BID  . insulin aspart  0-20 Units Subcutaneous Q4H  . insulin aspart  6 Units Subcutaneous TID WC  . insulin detemir  30 Units Subcutaneous BID  . lactulose  20 g Oral Daily  . lidocaine  1 patch Transdermal  Q24H  . midodrine  10 mg Oral TID WC  . pantoprazole  40 mg Oral Daily   Continuous Infusions: . sodium chloride    . norepinephrine (LEVOPHED) Adult infusion Stopped (03/20/21 0736)     LOS: 3 days    Time spent: 35 minutes    Ayman Brull D Manuella Ghazi, DO [facility]  If 7PM-7AM, please contact night-coverage [email or web address] 03/20/2021, 12:38 PM

## 2021-03-20 NOTE — Plan of Care (Signed)
  Problem: Education: Goal: Knowledge of General Education information will improve Description: Including pain rating scale, medication(s)/side effects and non-pharmacologic comfort measures 03/20/2021 2249 by Marinus Maw, RN Outcome: Progressing 03/20/2021 2248 by Marinus Maw, RN Outcome: Progressing   Problem: Health Behavior/Discharge Planning: Goal: Ability to manage health-related needs will improve 03/20/2021 2249 by Marinus Maw, RN Outcome: Progressing 03/20/2021 2248 by Marinus Maw, RN Outcome: Progressing   Problem: Clinical Measurements: Goal: Ability to maintain clinical measurements within normal limits will improve 03/20/2021 2249 by Marinus Maw, RN Outcome: Progressing 03/20/2021 2248 by Marinus Maw, RN Outcome: Progressing Goal: Will remain free from infection 03/20/2021 2249 by Marinus Maw, RN Outcome: Progressing 03/20/2021 2248 by Marinus Maw, RN Outcome: Progressing Goal: Diagnostic test results will improve 03/20/2021 2249 by Marinus Maw, RN Outcome: Progressing 03/20/2021 2248 by Marinus Maw, RN Outcome: Progressing Goal: Respiratory complications will improve 03/20/2021 2249 by Marinus Maw, RN Outcome: Progressing 03/20/2021 2248 by Marinus Maw, RN Outcome: Progressing Goal: Cardiovascular complication will be avoided 03/20/2021 2249 by Marinus Maw, RN Outcome: Progressing 03/20/2021 2248 by Marinus Maw, RN Outcome: Progressing   Problem: Activity: Goal: Risk for activity intolerance will decrease 03/20/2021 2249 by Marinus Maw, RN Outcome: Progressing 03/20/2021 2248 by Marinus Maw, RN Outcome: Progressing   Problem: Nutrition: Goal: Adequate nutrition will be maintained 03/20/2021 2249 by Marinus Maw, RN Outcome: Progressing 03/20/2021 2248 by Marinus Maw, RN Outcome: Progressing   Problem: Coping: Goal: Level of anxiety will decrease 03/20/2021 2249 by Marinus Maw, RN Outcome:  Progressing 03/20/2021 2248 by Marinus Maw, RN Outcome: Progressing   Problem: Elimination: Goal: Will not experience complications related to bowel motility 03/20/2021 2249 by Marinus Maw, RN Outcome: Progressing 03/20/2021 2248 by Marinus Maw, RN Outcome: Progressing Goal: Will not experience complications related to urinary retention 03/20/2021 2249 by Marinus Maw, RN Outcome: Progressing 03/20/2021 2248 by Marinus Maw, RN Outcome: Progressing   Problem: Pain Managment: Goal: General experience of comfort will improve 03/20/2021 2249 by Marinus Maw, RN Outcome: Progressing 03/20/2021 2248 by Marinus Maw, RN Outcome: Progressing   Problem: Safety: Goal: Ability to remain free from injury will improve 03/20/2021 2249 by Marinus Maw, RN Outcome: Progressing 03/20/2021 2248 by Marinus Maw, RN Outcome: Progressing   Problem: Skin Integrity: Goal: Risk for impaired skin integrity will decrease 03/20/2021 2249 by Marinus Maw, RN Outcome: Progressing 03/20/2021 2248 by Marinus Maw, RN Outcome: Progressing

## 2021-03-21 DIAGNOSIS — N179 Acute kidney failure, unspecified: Secondary | ICD-10-CM | POA: Diagnosis not present

## 2021-03-21 LAB — RETICULOCYTES
Immature Retic Fract: 21.3 % — ABNORMAL HIGH (ref 2.3–15.9)
RBC.: 2.24 MIL/uL — ABNORMAL LOW (ref 4.22–5.81)
Retic Count, Absolute: 134 10*3/uL (ref 19.0–186.0)
Retic Ct Pct: 6 % — ABNORMAL HIGH (ref 0.4–3.1)

## 2021-03-21 LAB — BASIC METABOLIC PANEL
Anion gap: 8 (ref 5–15)
BUN: 38 mg/dL — ABNORMAL HIGH (ref 8–23)
CO2: 21 mmol/L — ABNORMAL LOW (ref 22–32)
Calcium: 9 mg/dL (ref 8.9–10.3)
Chloride: 105 mmol/L (ref 98–111)
Creatinine, Ser: 1.56 mg/dL — ABNORMAL HIGH (ref 0.61–1.24)
GFR, Estimated: 50 mL/min — ABNORMAL LOW (ref >60)
Glucose, Bld: 207 mg/dL — ABNORMAL HIGH (ref 70–99)
Potassium: 4.8 mmol/L (ref 3.5–5.1)
Sodium: 134 mmol/L — ABNORMAL LOW (ref 135–145)

## 2021-03-21 LAB — GLUCOSE, CAPILLARY
Glucose-Capillary: 183 mg/dL — ABNORMAL HIGH (ref 70–99)
Glucose-Capillary: 218 mg/dL — ABNORMAL HIGH (ref 70–99)
Glucose-Capillary: 276 mg/dL — ABNORMAL HIGH (ref 70–99)
Glucose-Capillary: 295 mg/dL — ABNORMAL HIGH (ref 70–99)
Glucose-Capillary: 386 mg/dL — ABNORMAL HIGH (ref 70–99)
Glucose-Capillary: 393 mg/dL — ABNORMAL HIGH (ref 70–99)
Glucose-Capillary: 406 mg/dL — ABNORMAL HIGH (ref 70–99)

## 2021-03-21 LAB — CBC
HCT: 23 % — ABNORMAL LOW (ref 39.0–52.0)
Hemoglobin: 7.7 g/dL — ABNORMAL LOW (ref 13.0–17.0)
MCH: 33.5 pg (ref 26.0–34.0)
MCHC: 33.5 g/dL (ref 30.0–36.0)
MCV: 100 fL (ref 80.0–100.0)
Platelets: 48 10*3/uL — ABNORMAL LOW (ref 150–400)
RBC: 2.3 MIL/uL — ABNORMAL LOW (ref 4.22–5.81)
RDW: 15.3 % (ref 11.5–15.5)
WBC: 3.8 10*3/uL — ABNORMAL LOW (ref 4.0–10.5)
nRBC: 0 % (ref 0.0–0.2)

## 2021-03-21 LAB — IRON AND TIBC
Iron: 32 ug/dL — ABNORMAL LOW (ref 45–182)
Saturation Ratios: 10 % — ABNORMAL LOW (ref 17.9–39.5)
TIBC: 331 ug/dL (ref 250–450)
UIBC: 299 ug/dL

## 2021-03-21 LAB — MAGNESIUM: Magnesium: 1.8 mg/dL (ref 1.7–2.4)

## 2021-03-21 LAB — FERRITIN: Ferritin: 86 ng/mL (ref 24–336)

## 2021-03-21 LAB — VITAMIN B12: Vitamin B-12: 470 pg/mL (ref 180–914)

## 2021-03-21 LAB — FOLATE: Folate: 7.8 ng/mL (ref >5.9)

## 2021-03-21 LAB — GLUCOSE, RANDOM: Glucose, Bld: 441 mg/dL — ABNORMAL HIGH (ref 70–99)

## 2021-03-21 MED ORDER — INSULIN ASPART 100 UNIT/ML ~~LOC~~ SOLN
8.0000 [IU] | Freq: Three times a day (TID) | SUBCUTANEOUS | Status: DC
  Administered 2021-03-21 – 2021-03-23 (×7): 8 [IU] via SUBCUTANEOUS

## 2021-03-21 MED ORDER — SODIUM CHLORIDE 0.9 % IV SOLN
510.0000 mg | Freq: Once | INTRAVENOUS | Status: AC
  Administered 2021-03-21: 510 mg via INTRAVENOUS
  Filled 2021-03-21: qty 17

## 2021-03-21 NOTE — Progress Notes (Signed)
Inpatient Diabetes Program Recommendations  AACE/ADA: New Consensus Statement on Inpatient Glycemic Control (2015)  Target Ranges:  Prepandial:   less than 140 mg/dL      Peak postprandial:   less than 180 mg/dL (1-2 hours)      Critically ill patients:  140 - 180 mg/dL   Lab Results  Component Value Date   GLUCAP 183 (H) 03/21/2021   HGBA1C 7.7 (H) 03/17/2021    Review of Glycemic Control Results for ANSELMO, REIHL" (MRN [id number]) as of 03/21/2021 08:56  Ref. Range 03/20/2021 19:54 03/20/2021 21:33 03/21/2021 00:27 03/21/2021 03:43 03/21/2021 07:27  Glucose-Capillary Latest Ref Range: 70 - 99 mg/dL 402 (H) 360 (H) 295 (H) 218 (H) 183 (H)   Inpatient Diabetes Program Recommendations: Noted postprandial CBGs elevated. Please consider: -Increase Novolog meal coverage to 8 units tid if eats 50%  Thank you, Nani Gasser. Areyanna Figeroa, RN, MSN, CDE  Diabetes Coordinator Inpatient Glycemic Control Team Team Pager [phone] (8am-5pm) 03/21/2021 8:58 AM

## 2021-03-21 NOTE — Treatment Plan (Signed)
Pts CHG dressing to right IJ changed at this time. CHG disc changed as well. Site is clean, dry, pink, site into IJ. Pt tolerated dressing change well. Prior dressing had moderate dried blood to site. CHG scrubber cleansed triple lumen connector port in aseptic technique at this time removing dried blood. Dressing date, time and intialed by Probation officer.

## 2021-03-21 NOTE — Treatment Plan (Signed)
Lab called and asked about add on fecal occult and cdiff. Labs states due to preservative, unable to add on labs. Pt has not had a bowel movement as of yet at this time. Will continue to try to collect.

## 2021-03-21 NOTE — Progress Notes (Signed)
PROGRESS NOTE    DAYMOND CORDTS  QAS:[id number] DOB: October 30, 1961 DOA: 03/16/2021 PCP: [name], MD   Brief Narrative:   [name] a59 y.o.male,with history of type 2 diabetes mellitus, thrombocytopenia, hypotension, hyperlipidemia, CKD, nonalcoholic cirrhosis, coronary artery disease status post CABG, and more presents to the ED with a chief complaint of back pain.Patient was admitted with AKI as well as hyperkalemia and possible mild pancreatitis that was noted on CT, but he is otherwise asymptomatic from this. He was also noted to have significant hyperglycemia and hypotension. AKI is improving and blood glucose levels continue to fluctuate with the addition of steroids.  Norepinephrine has been weaned.  He is noted to have some ongoing diarrhea.  Assessment & Plan:   Principal Problem:   AKI (acute kidney injury) ([place]) Active Problems:   Type 2 diabetes mellitus with hyperglycemia (HCC)   Hyponatremia   Hypotension   Hyperkalemia   Elevated troponin   Back pain   AKI-improving -Nonoliguric -Likely related to hepatorenal syndrome -Continue IV fluid for now -Recheck a.m. labs  Ongoing hypotension-improving -Likely related to adrenal insufficiency -Norepinephrine has been weaned off, continue to monitor in ICU for now -Continue midodrine -Cortisol level very low at 1.4 -Continue dexamethasone 4 mg IV twice daily for now and plan to start while hydrocortisone which should take effect in the next 1-2 days at which point IV dexamethasone can be weaned hopefully to off -Okay for PT assessment  Hyperglycemia in the setting of type 2 diabetes-improving -Insulin drip now discontinued and patientnow onSSI and long-acting -Carb modified diet -Adjusted insulin dosages with aggressive SSI given use of dexamethasone  Elevated troponin -Likely related to AKI, no signs of ACS otherwise noted  Hyponatremia-ongoing -Likely related to adrenal  insufficiency -Continue to monitor -Trial of IV fluid normal saline  Back pain-resolved -Likely musculoskeletal -Lidocaine patch for now  Diarrhea -May be related to pancreatic insufficiency, may need to try low-fat diet and Creon -No recent antibiotic use or abdominal pain noted -Plan to check GI panel-pending -Cdiff pending -Imodium as needed  History of chronic diastolic CHF -Holding spironolactone for now, given above -Weight of 278lbs and baseline 264lbs -Will plan to diurese prior to [facility] off pressors  History of CAD and prior CABG 2016  History of hypertension -Continue midodrine and wean norepinephrine -Noted to have adrenal insufficiency as noted above and will maintain on steroids  History of liver cirrhosis -He is [facility] liver transplant list  DVT prophylaxis:Heparin Code Status:Full Family Communication:Discussed with wifeon the phone 4/8 Disposition Plan: Status is: Inpatient  Remains inpatient appropriate because:IV treatments appropriate due to intensity of illness or inability to take PO and Inpatient level of care appropriate due to severity of illness   Dispo: The patient is from:Home Anticipated d/c is NL:GXQJ Patient currently is not medically stable to d/c. Difficult to place patient No   Consultants:  None  Procedures:  Right internal jugular central venous line 4/5  See below  Antimicrobials:   None   Subjective: Patient seen and evaluated today with no new acute complaints or concerns. No acute concerns or events noted overnight.  He states that his diarrhea is improving.  Objective: Vitals:   03/21/21 0219 03/21/21 0611 03/21/21 0747 03/21/21 0944  BP: 119/68 (!) 95/47 (!) 102/50 (!) 104/59  Pulse: 70 69 67 71  Resp: 20  20 16   Temp: (!) 97.5 F (36.4 C) (!) 97.5 F (36.4 C)  97.8 F (36.6 C)  TempSrc: Oral Oral  Oral  SpO2: 100% 99%  99%  Weight:       Height:        Intake/Output Summary (Last 24 hours) at 03/21/2021 1105 Last data filed at 03/21/2021 1014 Gross per 24 hour  Intake 1787.5 ml  Output 3300 ml  Net -1512.5 ml   Filed Weights   03/18/21 0451 03/19/21 0534 03/20/21 0436  Weight: 128.6 kg 127.1 kg 126.1 kg    Examination:  General exam: Appears calm and comfortable, obese Respiratory system: Clear to auscultation. Respiratory effort normal. Cardiovascular system: S1 & S2 heard, RRR.  Gastrointestinal system: Abdomen is soft Central nervous system: Alert and awake Extremities: No edema Skin: No significant lesions noted Psychiatry: Flat affect.    Data Reviewed: I have personally reviewed following labs and imaging studies  CBC: Recent Labs  Lab 03/16/21 2332 03/17/21 0607 03/18/21 0905 03/19/21 0636 03/20/21 0438 03/21/21 0643  WBC 8.0 5.1 6.5 6.2 4.4 3.8*  NEUTROABS 5.2  --   --   --   --   --   HGB 10.3* 9.1* 10.3* 8.8* 8.6* 7.7*  HCT 30.9* 28.0* 31.4* 26.7* 25.4* 23.0*  MCV 102.7* 104.5* 102.3* 101.5* 98.8 100.0  PLT 96* 79* 102* 74* 55* 48*   Basic Metabolic Panel: Recent Labs  Lab 03/17/21 0607 03/18/21 0633 03/18/21 0905 03/19/21 0636 03/20/21 0438 03/21/21 0643  NA 129* 135  --  134* 127* 134*  K 5.3* 5.4*  --  4.9 5.4* 4.8  CL 101 106  --  104 99 105  CO2 17* 21*  --  22 20* 21*  GLUCOSE 270* 110*  --  167* 361* 207*  BUN 71* 60*  --  41* 34* 38*  CREATININE 3.70* 2.48*  --  1.85* 1.58* 1.56*  CALCIUM 8.2* 8.7*  --  8.8* 8.7* 9.0  MG 2.8*  --  2.6* 2.2 1.8 1.8   GFR: Estimated Creatinine Clearance: 66.3 mL/min (A) (by C-G formula based on SCr of 1.56 mg/dL (H)). Liver Function Tests: Recent Labs  Lab 03/16/21 2332 03/17/21 0607 03/19/21 0636  AST 31 31 34  ALT 18 15 16   ALKPHOS 51 42 39  BILITOT 1.3* 1.0 1.5*  PROT 6.2* 5.6* 5.7*  ALBUMIN 3.2* 2.9* 2.8*   Recent Labs  Lab 03/16/21 2332 03/17/21 0607  LIPASE 37 32   Recent Labs  Lab 03/17/21 0607 03/18/21 0905   AMMONIA 78* 55*   Coagulation Profile: Recent Labs  Lab 03/16/21 2332  INR 1.1   Cardiac Enzymes: No results for input(s): CKTOTAL, CKMB, CKMBINDEX, TROPONINI in the last 168 hours. BNP (last 3 results) No results for input(s): PROBNP in the last 8760 hours. HbA1C: No results for input(s): HGBA1C in the last 72 hours. CBG: Recent Labs  Lab 03/20/21 2133 03/21/21 0027 03/21/21 0343 03/21/21 0727 03/21/21 1058  GLUCAP 360* 295* 218* 183* 276*   Lipid Profile: No results for input(s): CHOL, HDL, LDLCALC, TRIG, CHOLHDL, LDLDIRECT in the last 72 hours. Thyroid Function Tests: No results for input(s): TSH, T4TOTAL, FREET4, T3FREE, THYROIDAB in the last 72 hours. Anemia Panel: Recent Labs    03/21/21 0803  VITAMINB12 470  FOLATE 7.8  FERRITIN 86  TIBC 331  IRON 32*  RETICCTPCT 6.0*   Sepsis Labs: Recent Labs  Lab 03/17/21 0232 03/17/21 0607 03/18/21 0905  LATICACIDVEN 1.4 2.1* 1.2    Recent Results (from the past 240 hour(s))  Resp Panel by RT-PCR (Flu A&B, Covid) Nasopharyngeal Swab     Status: None  Collection Time: 03/17/21  1:28 AM   Specimen: Nasopharyngeal Swab; Nasopharyngeal(NP) swabs in vial transport medium  Result Value Ref Range Status   SARS Coronavirus 2 by RT PCR NEGATIVE NEGATIVE Final    Comment: (NOTE) SARS-CoV-2 target nucleic acids are NOT DETECTED.  The SARS-CoV-2 RNA is generally detectable in upper respiratory specimens during the acute phase of infection. The lowest concentration of SARS-CoV-2 viral copies this assay can detect is 138 copies/mL. A negative result does not preclude SARS-Cov-2 infection and should not be used as the sole basis for treatment or other patient management decisions. A negative result may occur with  improper specimen collection/handling, submission of specimen other than nasopharyngeal swab, presence of viral mutation(s) within the areas targeted by this assay, and inadequate number of viral copies(<138  copies/mL). A negative result must be combined with clinical observations, patient history, and epidemiological information. The expected result is Negative.  Fact Sheet for Patients:  EntrepreneurPulse.com.au  Fact Sheet for Healthcare Providers:  IncredibleEmployment.be  This test is no t yet approved or cleared by the [place] FDA and  has been authorized for detection and/or diagnosis of SARS-CoV-2 by FDA under an Emergency Use Authorization (EUA). This EUA will remain  in effect (meaning this test can be used) for the duration of the COVID-19 declaration under Section 564(b)(1) of the Act, 21 U.S.C.section 360bbb-3(b)(1), unless the authorization is terminated  or revoked sooner.       Influenza A by PCR NEGATIVE NEGATIVE Final   Influenza B by PCR NEGATIVE NEGATIVE Final    Comment: (NOTE) The Xpert Xpress SARS-CoV-2/FLU/RSV plus assay is intended as an aid in the diagnosis of influenza from Nasopharyngeal swab specimens and should not be used as a sole basis for treatment. Nasal washings and aspirates are unacceptable for Xpert Xpress SARS-CoV-2/FLU/RSV testing.  Fact Sheet for Patients: EntrepreneurPulse.com.au  Fact Sheet for Healthcare Providers: IncredibleEmployment.be  This test is not yet approved or cleared by the [place] FDA and has been authorized for detection and/or diagnosis of SARS-CoV-2 by FDA under an Emergency Use Authorization (EUA). This EUA will remain in effect (meaning this test can be used) for the duration of the COVID-19 declaration under Section 564(b)(1) of the Act, 21 U.S.C. section 360bbb-3(b)(1), unless the authorization is terminated or revoked.  Performed at [facility], [address] [place], [place] [address]   MRSA PCR Screening     Status: None   Collection Time: 03/17/21  3:37 AM   Specimen: Nasal Mucosa; Nasopharyngeal  Result Value Ref Range  Status   MRSA by PCR NEGATIVE NEGATIVE Final    Comment:        The GeneXpert MRSA Assay (FDA approved for NASAL specimens only), is one component of a comprehensive MRSA colonization surveillance program. It is not intended to diagnose MRSA infection nor to guide or monitor treatment for MRSA infections. Performed at [facility], [address], [place], [place] [address]          Radiology Studies: No results found.      Scheduled Meds: . aspirin EC  81 mg Oral QHS  . Chlorhexidine Gluconate Cloth  6 each Topical Daily  . dexamethasone (DECADRON) injection  4 mg Intravenous Q12H  . hydrocortisone  20 mg Oral BID  . insulin aspart  0-20 Units Subcutaneous Q4H  . insulin aspart  8 Units Subcutaneous TID WC  . insulin detemir  30 Units Subcutaneous BID  . lidocaine  1 patch Transdermal Q24H  . midodrine  10 mg Oral TID WC  . pantoprazole  40 mg Oral Daily   Continuous Infusions: . sodium chloride 75 mL/hr at 03/21/21 0640  . ferumoxytol       LOS: 4 days    Time spent: 35 minutes    Terik Haughey Darleen Crocker, DO [facility]  If 7PM-7AM, please contact night-coverage [email or web address] 03/21/2021, 11:05 AM

## 2021-03-21 NOTE — Treatment Plan (Addendum)
FSBS is 406.MD Manuella Ghazi secure messaged at this time. Glucose for lab verification ordered per protocol. 8 Units with dinner given at this time. Awaiting Md response.   MD paged at this time. 109   MD Manuella Ghazi repaged at this time 1622. Md called back and states to administer 30 units at this time of Novolog.   Md text paged at this time 1635 : lab verification was 441. I gave 30 units plus extra 8 for dinner, MD states thanks at 1637.     FSBS 393 recheck at this time.  1703.

## 2021-03-21 NOTE — Progress Notes (Signed)
Rapport given to [employer] on 300 unit.

## 2021-03-22 DIAGNOSIS — N179 Acute kidney failure, unspecified: Secondary | ICD-10-CM | POA: Diagnosis not present

## 2021-03-22 LAB — GLUCOSE, CAPILLARY
Glucose-Capillary: 121 mg/dL — ABNORMAL HIGH (ref 70–99)
Glucose-Capillary: 173 mg/dL — ABNORMAL HIGH (ref 70–99)
Glucose-Capillary: 179 mg/dL — ABNORMAL HIGH (ref 70–99)
Glucose-Capillary: 202 mg/dL — ABNORMAL HIGH (ref 70–99)
Glucose-Capillary: 226 mg/dL — ABNORMAL HIGH (ref 70–99)
Glucose-Capillary: 275 mg/dL — ABNORMAL HIGH (ref 70–99)

## 2021-03-22 LAB — GASTROINTESTINAL PANEL BY PCR, STOOL (REPLACES STOOL CULTURE)

## 2021-03-22 LAB — BASIC METABOLIC PANEL
Anion gap: 8 (ref 5–15)
BUN: 37 mg/dL — ABNORMAL HIGH (ref 8–23)
CO2: 21 mmol/L — ABNORMAL LOW (ref 22–32)
Calcium: 9.1 mg/dL (ref 8.9–10.3)
Chloride: 106 mmol/L (ref 98–111)
Creatinine, Ser: 1.4 mg/dL — ABNORMAL HIGH (ref 0.61–1.24)
GFR, Estimated: 57 mL/min — ABNORMAL LOW (ref >60)
Glucose, Bld: 140 mg/dL — ABNORMAL HIGH (ref 70–99)
Potassium: 4.4 mmol/L (ref 3.5–5.1)
Sodium: 135 mmol/L (ref 135–145)

## 2021-03-22 LAB — CBC
HCT: 23.7 % — ABNORMAL LOW (ref 39.0–52.0)
Hemoglobin: 7.9 g/dL — ABNORMAL LOW (ref 13.0–17.0)
MCH: 33.8 pg (ref 26.0–34.0)
MCHC: 33.3 g/dL (ref 30.0–36.0)
MCV: 101.3 fL — ABNORMAL HIGH (ref 80.0–100.0)
Platelets: 51 10*3/uL — ABNORMAL LOW (ref 150–400)
RBC: 2.34 MIL/uL — ABNORMAL LOW (ref 4.22–5.81)
RDW: 15.7 % — ABNORMAL HIGH (ref 11.5–15.5)
WBC: 4.4 10*3/uL (ref 4.0–10.5)
nRBC: 0.7 % — ABNORMAL HIGH (ref 0.0–0.2)

## 2021-03-22 LAB — MAGNESIUM: Magnesium: 1.8 mg/dL (ref 1.7–2.4)

## 2021-03-22 MED ORDER — LACTULOSE 10 GM/15ML PO SOLN
20.0000 g | Freq: Every day | ORAL | Status: DC
  Administered 2021-03-22 – 2021-03-24 (×3): 20 g via ORAL
  Filled 2021-03-22 (×3): qty 30

## 2021-03-22 MED ORDER — HYDROCORTISONE 20 MG PO TABS
20.0000 mg | ORAL_TABLET | Freq: Two times a day (BID) | ORAL | Status: DC
  Administered 2021-03-22 – 2021-03-24 (×5): 20 mg via ORAL
  Filled 2021-03-22 (×7): qty 1

## 2021-03-22 MED ORDER — MIDODRINE HCL 5 MG PO TABS
15.0000 mg | ORAL_TABLET | Freq: Three times a day (TID) | ORAL | Status: DC
  Administered 2021-03-22 – 2021-03-24 (×7): 15 mg via ORAL
  Filled 2021-03-22 (×7): qty 3

## 2021-03-22 NOTE — Progress Notes (Signed)
PROGRESS NOTE    [name]  JFH:[id number] DOB: 1961-03-17 DOA: 03/16/2021 PCP: [name], MD   Brief Narrative:   [name] a59 y.o.male,with history of type 2 diabetes mellitus, thrombocytopenia, hypotension, hyperlipidemia, CKD, nonalcoholic cirrhosis, coronary artery disease status post CABG, and more presents to the ED with a chief complaint of back pain.Patient was admitted with AKI as well as hyperkalemia and possible mild pancreatitis that was noted on CT, but he is otherwise asymptomatic from this. He was also noted to have significant hyperglycemia and hypotension. AKI is improvingand blood glucose levels continue to fluctuate with the addition of steroids. Norepinephrine has been weaned.  He is noted to have some ongoing diarrhea, which has now resolved.  Assessment & Plan:   Principal Problem:   AKI (acute kidney injury) ([place]) Active Problems:   Type 2 diabetes mellitus with hyperglycemia (HCC)   Hyponatremia   Hypotension   Hyperkalemia   Elevated troponin   Back pain   AKI-improving -Nonoliguric -Likely related to hepatorenal syndrome -Continue IV fluid for now -Recheck a.m. labs  Ongoing hypotension-improving -Likely related to adrenal insufficiency -Norepinephrine has been weaned off, continue to monitor in ICU for now -Continue midodrine -Cortisol level very low at 1.4 -Discontinue dexamethasone today and continue hydrocortisone as ordered -Okay to pull central venous line today -Okay for PT assessment, pending  Hyperglycemia in the setting of type 2 diabetes-improving -Insulin drip now discontinued and patientnow onSSI and long-acting -Carb modified diet -Adjusted insulin dosages with aggressive SSI given use of dexamethasone  Elevated troponin -Likely related to AKI, no signs of ACS otherwise noted  Hyponatremia-ongoing -Likely related to adrenal insufficiency -Continue to monitor -Trial of IV fluid normal  saline to continue  Back pain-resolved -Likely musculoskeletal -Lidocaine patch for now  Diarrhea-resolved -Okay to resume lactulose/time -Imodium as needed  History of chronic diastolic CHF -Holding spironolactone for now, given above -Weight of 278lbs and baseline 264lbs -Hold diuresis for now  History of CAD and prior CABG 2016  History of hypertension -Continue midodrine and wean norepinephrine -Noted to have adrenal insufficiency as noted above and will maintain on steroids  History of liver cirrhosis -He is [facility] liver transplant list -Resume lactulose 4/10  DVT prophylaxis:Heparin Code Status:Full Family Communication:Discussed with wifeon the phone 4/10 Disposition Plan: Status is: Inpatient  Remains inpatient appropriate because:IV treatments appropriate due to intensity of illness or inability to take PO and Inpatient level of care appropriate due to severity of illness   Dispo: The patient is from:Home Anticipated d/c is LH:[facility] Patient currently is not medically stable to d/c. Difficult to place patient No   Consultants:  None  Procedures:  Right internal jugular central venous line 4/5  See below  Antimicrobials:  None   Subjective: Patient seen and evaluated today with no new acute complaints or concerns. No acute concerns or events noted overnight. His BP remains stable and he has no further diarrhea today.  Objective: Vitals:   03/21/21 1314 03/21/21 2021 03/22/21 0531 03/22/21 0844  BP: 122/63 (!) 107/52 (!) 101/57 114/65  Pulse: 72 68 63 73  Resp: 20 20 20    Temp: 97.6 F (36.4 C) (!) 97.4 F (36.3 C) 97.8 F (36.6 C)   TempSrc: Oral Oral Oral   SpO2: 100% 100% 100% 100%  Weight:      Height:        Intake/Output Summary (Last 24 hours) at 03/22/2021 1102 Last data filed at 03/22/2021 0420 Gross per 24 hour  Intake 1107.96 ml  Output 2250 ml  Net -1142.04 ml    Filed Weights   03/19/21 0534 03/20/21 0436 03/21/21 0747  Weight: 127.1 kg 126.1 kg 123.3 kg    Examination:  General exam: Appears calm and comfortable, obese Respiratory system: Clear to auscultation. Respiratory effort normal. Cardiovascular system: S1 & S2 heard, RRR.  Gastrointestinal system: Abdomen is soft Central nervous system: Alert and awake Extremities: No edema Skin: No significant lesions noted Psychiatry: Flat affect.    Data Reviewed: I have personally reviewed following labs and imaging studies  CBC: Recent Labs  Lab 03/16/21 2332 03/17/21 0607 03/18/21 0905 03/19/21 0636 03/20/21 0438 03/21/21 0643 03/22/21 0651  WBC 8.0   < > 6.5 6.2 4.4 3.8* 4.4  NEUTROABS 5.2  --   --   --   --   --   --   HGB 10.3*   < > 10.3* 8.8* 8.6* 7.7* 7.9*  HCT 30.9*   < > 31.4* 26.7* 25.4* 23.0* 23.7*  MCV 102.7*   < > 102.3* 101.5* 98.8 100.0 101.3*  PLT 96*   < > 102* 74* 55* 48* 51*   < > = values in this interval not displayed.   Basic Metabolic Panel: Recent Labs  Lab 03/18/21 0633 03/18/21 0905 03/19/21 0636 03/20/21 0438 03/21/21 0643 03/21/21 1609 03/22/21 0651  NA 135  --  134* 127* 134*  --  135  K 5.4*  --  4.9 5.4* 4.8  --  4.4  CL 106  --  104 99 105  --  106  CO2 21*  --  22 20* 21*  --  21*  GLUCOSE 110*  --  167* 361* 207* 441* 140*  BUN 60*  --  41* 34* 38*  --  37*  CREATININE 2.48*  --  1.85* 1.58* 1.56*  --  1.40*  CALCIUM 8.7*  --  8.8* 8.7* 9.0  --  9.1  MG  --  2.6* 2.2 1.8 1.8  --  1.8   GFR: Estimated Creatinine Clearance: 73 mL/min (A) (by C-G formula based on SCr of 1.4 mg/dL (H)). Liver Function Tests: Recent Labs  Lab 03/16/21 2332 03/17/21 0607 03/19/21 0636  AST 31 31 34  ALT 18 15 16   ALKPHOS 51 42 39  BILITOT 1.3* 1.0 1.5*  PROT 6.2* 5.6* 5.7*  ALBUMIN 3.2* 2.9* 2.8*   Recent Labs  Lab 03/16/21 2332 03/17/21 0607  LIPASE 37 32   Recent Labs  Lab 03/17/21 0607 03/18/21 0905  AMMONIA 78* 55*    Coagulation Profile: Recent Labs  Lab 03/16/21 2332  INR 1.1   Cardiac Enzymes: No results for input(s): CKTOTAL, CKMB, CKMBINDEX, TROPONINI in the last 168 hours. BNP (last 3 results) No results for input(s): PROBNP in the last 8760 hours. HbA1C: No results for input(s): HGBA1C in the last 72 hours. CBG: Recent Labs  Lab 03/21/21 1701 03/21/21 2007 03/22/21 0006 03/22/21 0401 03/22/21 0741  GLUCAP 393* 386* 275* 179* 121*   Lipid Profile: No results for input(s): CHOL, HDL, LDLCALC, TRIG, CHOLHDL, LDLDIRECT in the last 72 hours. Thyroid Function Tests: No results for input(s): TSH, T4TOTAL, FREET4, T3FREE, THYROIDAB in the last 72 hours. Anemia Panel: Recent Labs    03/21/21 0803  VITAMINB12 470  FOLATE 7.8  FERRITIN 86  TIBC 331  IRON 32*  RETICCTPCT 6.0*   Sepsis Labs: Recent Labs  Lab 03/17/21 0232 03/17/21 0607 03/18/21 0905  LATICACIDVEN 1.4 2.1* 1.2    Recent Results (from the past  240 hour(s))  Resp Panel by RT-PCR (Flu A&B, Covid) Nasopharyngeal Swab     Status: None   Collection Time: 03/17/21  1:28 AM   Specimen: Nasopharyngeal Swab; Nasopharyngeal(NP) swabs in vial transport medium  Result Value Ref Range Status   SARS Coronavirus 2 by RT PCR NEGATIVE NEGATIVE Final    Comment: (NOTE) SARS-CoV-2 target nucleic acids are NOT DETECTED.  The SARS-CoV-2 RNA is generally detectable in upper respiratory specimens during the acute phase of infection. The lowest concentration of SARS-CoV-2 viral copies this assay can detect is 138 copies/mL. A negative result does not preclude SARS-Cov-2 infection and should not be used as the sole basis for treatment or other patient management decisions. A negative result may occur with  improper specimen collection/handling, submission of specimen other than nasopharyngeal swab, presence of viral mutation(s) within the areas targeted by this assay, and inadequate number of viral copies(<138 copies/mL). A  negative result must be combined with clinical observations, patient history, and epidemiological information. The expected result is Negative.  Fact Sheet for Patients:  EntrepreneurPulse.com.au  Fact Sheet for Healthcare Providers:  IncredibleEmployment.be  This test is no t yet approved or cleared by the [place] FDA and  has been authorized for detection and/or diagnosis of SARS-CoV-2 by FDA under an Emergency Use Authorization (EUA). This EUA will remain  in effect (meaning this test can be used) for the duration of the COVID-19 declaration under Section 564(b)(1) of the Act, 21 U.S.C.section 360bbb-3(b)(1), unless the authorization is terminated  or revoked sooner.       Influenza A by PCR NEGATIVE NEGATIVE Final   Influenza B by PCR NEGATIVE NEGATIVE Final    Comment: (NOTE) The Xpert Xpress SARS-CoV-2/FLU/RSV plus assay is intended as an aid in the diagnosis of influenza from Nasopharyngeal swab specimens and should not be used as a sole basis for treatment. Nasal washings and aspirates are unacceptable for Xpert Xpress SARS-CoV-2/FLU/RSV testing.  Fact Sheet for Patients: EntrepreneurPulse.com.au  Fact Sheet for Healthcare Providers: IncredibleEmployment.be  This test is not yet approved or cleared by the [place] FDA and has been authorized for detection and/or diagnosis of SARS-CoV-2 by FDA under an Emergency Use Authorization (EUA). This EUA will remain in effect (meaning this test can be used) for the duration of the COVID-19 declaration under Section 564(b)(1) of the Act, 21 U.S.C. section 360bbb-3(b)(1), unless the authorization is terminated or revoked.  Performed at [facility], [address] [place], [place] [address]   MRSA PCR Screening     Status: None   Collection Time: 03/17/21  3:37 AM   Specimen: Nasal Mucosa; Nasopharyngeal  Result Value Ref Range Status   MRSA  by PCR NEGATIVE NEGATIVE Final    Comment:        The GeneXpert MRSA Assay (FDA approved for NASAL specimens only), is one component of a comprehensive MRSA colonization surveillance program. It is not intended to diagnose MRSA infection nor to guide or monitor treatment for MRSA infections. Performed at [facility], [address], [place], [place] [address]          Radiology Studies: No results found.      Scheduled Meds: . aspirin EC  81 mg Oral QHS  . Chlorhexidine Gluconate Cloth  6 each Topical Daily  . hydrocortisone  20 mg Oral BID  . insulin aspart  0-20 Units Subcutaneous Q4H  . insulin aspart  8 Units Subcutaneous TID WC  . insulin detemir  30 Units Subcutaneous BID  .  lactulose  20 g Oral Daily  . lidocaine  1 patch Transdermal Q24H  . midodrine  15 mg Oral TID WC  . pantoprazole  40 mg Oral Daily   Continuous Infusions: . sodium chloride 75 mL/hr at 03/22/21 0426     LOS: 5 days    Time spent: 35 minutes    Eliyah Mcshea Darleen Crocker, DO [facility]  If 7PM-7AM, please contact night-coverage [email or web address] 03/22/2021, 11:02 AM

## 2021-03-23 DIAGNOSIS — N179 Acute kidney failure, unspecified: Secondary | ICD-10-CM | POA: Diagnosis not present

## 2021-03-23 LAB — CBC
HCT: 25.7 % — ABNORMAL LOW (ref 39.0–52.0)
Hemoglobin: 8.3 g/dL — ABNORMAL LOW (ref 13.0–17.0)
MCH: 34 pg (ref 26.0–34.0)
MCHC: 32.3 g/dL (ref 30.0–36.0)
MCV: 105.3 fL — ABNORMAL HIGH (ref 80.0–100.0)
Platelets: 59 10*3/uL — ABNORMAL LOW (ref 150–400)
RBC: 2.44 MIL/uL — ABNORMAL LOW (ref 4.22–5.81)
RDW: 16.3 % — ABNORMAL HIGH (ref 11.5–15.5)
WBC: 5.8 10*3/uL (ref 4.0–10.5)
nRBC: 3.6 % — ABNORMAL HIGH (ref 0.0–0.2)

## 2021-03-23 LAB — BASIC METABOLIC PANEL
Anion gap: 9 (ref 5–15)
BUN: 37 mg/dL — ABNORMAL HIGH (ref 8–23)
CO2: 21 mmol/L — ABNORMAL LOW (ref 22–32)
Calcium: 9 mg/dL (ref 8.9–10.3)
Chloride: 107 mmol/L (ref 98–111)
Creatinine, Ser: 1.52 mg/dL — ABNORMAL HIGH (ref 0.61–1.24)
GFR, Estimated: 52 mL/min — ABNORMAL LOW (ref >60)
Glucose, Bld: 104 mg/dL — ABNORMAL HIGH (ref 70–99)
Potassium: 4 mmol/L (ref 3.5–5.1)
Sodium: 137 mmol/L (ref 135–145)

## 2021-03-23 LAB — MAGNESIUM: Magnesium: 1.9 mg/dL (ref 1.7–2.4)

## 2021-03-23 LAB — GLUCOSE, CAPILLARY
Glucose-Capillary: 115 mg/dL — ABNORMAL HIGH (ref 70–99)
Glucose-Capillary: 117 mg/dL — ABNORMAL HIGH (ref 70–99)
Glucose-Capillary: 136 mg/dL — ABNORMAL HIGH (ref 70–99)
Glucose-Capillary: 156 mg/dL — ABNORMAL HIGH (ref 70–99)
Glucose-Capillary: 159 mg/dL — ABNORMAL HIGH (ref 70–99)
Glucose-Capillary: 160 mg/dL — ABNORMAL HIGH (ref 70–99)
Glucose-Capillary: 86 mg/dL (ref 70–99)

## 2021-03-23 LAB — AMMONIA: Ammonia: 56 umol/L — ABNORMAL HIGH (ref 9–35)

## 2021-03-23 NOTE — Plan of Care (Signed)
  Problem: Acute Rehab PT Goals(only PT should resolve) Goal: Pt Will Go Supine/Side To Sit Outcome: Progressing Flowsheets (Taken 03/23/2021 1436) Pt will go Supine/Side to Sit:  Independently  with modified independence Goal: Patient Will Transfer Sit To/From Stand Outcome: Progressing Flowsheets (Taken 03/23/2021 1436) Patient will transfer sit to/from stand: with modified independence Goal: Pt Will Transfer Bed To Chair/Chair To Bed Outcome: Progressing Flowsheets (Taken 03/23/2021 1436) Pt will Transfer Bed to Chair/Chair to Bed: with modified independence Goal: Pt Will Ambulate Outcome: Progressing Flowsheets (Taken 03/23/2021 1436) Pt will Ambulate:  100 feet  with modified independence  with rolling walker   2:37 PM, 03/23/21 Lonell Grandchild, MPT Physical Therapist with [facility] [phone] office [phone] mobile phone

## 2021-03-23 NOTE — Evaluation (Signed)
Physical Therapy Evaluation Patient Details Name: [name] MRN: [id number] DOB: 04/24/1961 Today's Date: 03/23/2021   History of Present Illness  [name]  is a 60 y.o. male, with history of type 2 diabetes mellitus, thrombocytopenia, hypotension, hyperlipidemia, CKD, nonalcoholic cirrhosis, coronary artery disease status post CABG, and more presents to the ED with a chief complaint of back pain.  Patient reports that the pain was acute in onset in his lower back yesterday.  It got gradually worse through the night.  He reports that at its worst it was a 10 out of 10 and right now it is an 8 out of 10.  He reports that he attempted slouched walking to relieve the pain, but did not made his neck hurt.  He did not try any pain medications as he is very careful with medications due to his cirrhosis and kidney disease.  He reports that he attempted sitting straight up in a chair and that seemed to help the pain.  He tried Biofreeze and that helped and then he was able to get to sleep.  Patient reports when he woke up this morning the pain was much worse and he can even walk.  He reports no recent falls or trauma to his back.  The pain is not on one side more than the other but across the whole back.  Pain is not associated with with voiding.    Clinical Impression  Patient functioning near baseline for functional mobility and gait demonstrating slightly labored cadence using RW without loss of balance, limited mostly due to fatigue and tolerated sitting up in chair after therapy.  Patient will benefit from continued physical therapy in hospital and recommended venue below to increase strength, balance, endurance for safe ADLs and gait.     Follow Up Recommendations Home health PT;Supervision for mobility/OOB;Supervision - Intermittent    Equipment Recommendations  None recommended by PT    Recommendations for Other Services       Precautions / Restrictions  Precautions Precautions: Fall Restrictions Weight Bearing Restrictions: No      Mobility  Bed Mobility Overal bed mobility: Modified Independent                  Transfers Overall transfer level: Needs assistance Equipment used: Rolling walker (2 wheeled) Transfers: Sit to/from [employer] Transfers Sit to Stand: Supervision Stand pivot transfers: Supervision       General transfer comment: increased time, slightly labored movement  Ambulation/Gait Ambulation/Gait assistance: Supervision Gait Distance (Feet): 80 Feet Assistive device: Rolling walker (2 wheeled) Gait Pattern/deviations: Decreased step length - left;Decreased stance time - right;Decreased stride length Gait velocity: decreased   General Gait Details: slightly labored cadence without loss of balance, limited mostly due to fatigue  Stairs            Wheelchair Mobility    Modified Rankin (Stroke Patients Only)       Balance Overall balance assessment: Needs assistance Sitting-balance support: Feet supported;No upper extremity supported Sitting balance-Leahy Scale: Good Sitting balance - Comments: seated at EOB   Standing balance support: During functional activity;Bilateral upper extremity supported Standing balance-Leahy Scale: Fair Standing balance comment: fair/good using RW                             Pertinent Vitals/Pain Pain Assessment: No/denies pain    Home Living Family/patient expects to be discharged to:: Private residence Living Arrangements: Spouse/significant other Available Help at  Discharge: Family;Available 24 hours/day Type of Home: House Home Access: Stairs to enter Entrance Stairs-Rails: Left Entrance Stairs-Number of Steps: 3 Home Layout: One level Home Equipment: Walker - 2 wheels;Cane - single point;Cane - quad      Prior Function Level of Independence: Independent with assistive device(s)         Comments: Hydrographic surveyor  using RW PRN     Hand Dominance   Dominant Hand: Right    Extremity/Trunk Assessment   Upper Extremity Assessment Upper Extremity Assessment: Overall WFL for tasks assessed    Lower Extremity Assessment Lower Extremity Assessment: Generalized weakness    Cervical / Trunk Assessment Cervical / Trunk Assessment: Normal  Communication   Communication: No difficulties  Cognition Arousal/Alertness: Awake/alert Behavior During Therapy: WFL for tasks assessed/performed Overall Cognitive Status: Within Functional Limits for tasks assessed                                        General Comments      Exercises     Assessment/Plan    PT Assessment Patient needs continued PT services  PT Problem List Decreased strength;Decreased activity tolerance;Decreased balance;Decreased mobility       PT Treatment Interventions DME instruction;Stair training;Gait training;Functional mobility training;Therapeutic activities;Therapeutic exercise;Patient/family education;Balance training    PT Goals (Current goals can be found in the Care Plan section)  Acute Rehab PT Goals Patient Stated Goal: return home with family to assist PT Goal Formulation: With patient Time For Goal Achievement: 03/27/21 Potential to Achieve Goals: Good    Frequency Min 3X/week   Barriers to discharge        Co-evaluation               AM-PAC PT "6 Clicks" Mobility  Outcome Measure Help needed turning from your back to your side while in a flat bed without using bedrails?: None Help needed moving from lying on your back to sitting on the side of a flat bed without using bedrails?: None Help needed moving to and from a bed to a chair (including a wheelchair)?: A Little Help needed standing up from a chair using your arms (e.g., wheelchair or bedside chair)?: A Little Help needed to walk in hospital room?: A Little Help needed climbing 3-5 steps with a railing? : A Little 6 Click  Score: 20    End of Session   Activity Tolerance: Patient tolerated treatment well;Patient limited by fatigue Patient left: in chair;with call bell/phone within reach Nurse Communication: Mobility status PT Visit Diagnosis: Unsteadiness on feet (R26.81);Other abnormalities of gait and mobility (R26.89);Muscle weakness (generalized) (M62.81)    Time: 0165-5374 PT Time Calculation (min) (ACUTE ONLY): 22 min   Charges:   PT Evaluation $PT Eval Moderate Complexity: 1 Mod PT Treatments $Therapeutic Activity: 8-22 mins        2:35 PM, 03/23/21 Lonell Grandchild, MPT Physical Therapist with [facility] [phone] office [phone] mobile phone

## 2021-03-23 NOTE — Plan of Care (Signed)

## 2021-03-23 NOTE — Progress Notes (Signed)
Social visit for Dr Domenic Polite Wife called our office He has pancreatitis with cirrhosis waiting transplant Post CABG no chest pain wit elevated troponin from CRF Ammonia level stable 55 Cr stable 1.52 Volume status ok and off pressors He indicates feeling better   Call if needed but cardiac status is stable   Jenkins Rouge MD [facility]

## 2021-03-23 NOTE — Progress Notes (Signed)
PROGRESS NOTE    [name]  ACZ:[id number] DOB: May 09, 1961 DOA: 03/16/2021 PCP: Christopher Sites, MD   Brief Narrative:   [name] a59 y.o.male,with history of type 2 diabetes mellitus, thrombocytopenia, hypotension, hyperlipidemia, CKD, nonalcoholic cirrhosis, coronary artery disease status post CABG, and more presents to the ED with a chief complaint of back pain.Patient was admitted with AKI as well as hyperkalemia and possible mild pancreatitis that was noted on CT, but he is otherwise asymptomatic from this. He was also noted to have significant hyperglycemia and hypotension. AKI is improvingand blood glucose levels continue to fluctuate with the addition of steroids. Norepinephrine has been weaned.He is noted to have some ongoing diarrhea, which has now resolved.  Assessment & Plan:   Principal Problem:   AKI (acute kidney injury) ([place]) Active Problems:   Type 2 diabetes mellitus with hyperglycemia (HCC)   Hyponatremia   Hypotension   Hyperkalemia   Elevated troponin   Back pain   AKI-improving -Nonoliguric -Likely related to hepatorenal syndrome -Continue IV fluid for now -Recheck a.m. labs  Ongoing hypotension-improving -Likely related to adrenal insufficiency -Norepinephrine has been weaned off -Continue midodrine -Cortisol level very low at 1.4 -Continue hydrocortisone as ordered -Okay for PT assessment, pending  Hyperglycemia in the setting of type 2 diabetes-improving -Insulin drip now discontinued and patientnow onSSI and long-acting -Carb modified diet -Adjusted insulin dosages with aggressive SSI given use of dexamethasone  Elevated troponin -Likely related to AKI, no signs of ACS otherwise noted  Hyponatremia -Likely related to adrenal insufficiency -Currently resolved  Back pain-resolved -Likely musculoskeletal -Lidocaine patch for now  Diarrhea-resolved -Okay to resume lactulose/time -Imodium as  needed  History of chronic diastolic CHF -Holding spironolactone for now, given above -Weight of 272lbs and baseline near 264lbs -Hold diuresis for now  History of CAD and prior CABG 2016  History of hypertension -Avoid antihypertensives -Continue midodrine and hydrocortisone -Noted to have adrenal insufficiency as noted above and will maintain on steroids  History of liver cirrhosis -He is [facility] liver transplant list -Resume lactulose 4/10  DVT prophylaxis:Heparin Code Status:Full Family Communication:Discussed with wifeon the phone 4/10 Disposition Plan: Status is: Inpatient  Remains inpatient appropriate because:IV treatments appropriate due to intensity of illness or inability to take PO and Inpatient level of care appropriate due to severity of illness   Dispo: The patient is from:Home Anticipated d/c is FU:XNAT Patient currently is not medically stable to d/c. Difficult to place patient No   Consultants:  None  Procedures:  Right internal jugular central venous line 4/5  See below  Antimicrobials:  None  Subjective: Patient seen and evaluated today with no new acute complaints or concerns. No acute concerns or events noted overnight. Somnolent.  Objective: Vitals:   03/22/21 1424 03/22/21 2115 03/23/21 0450 03/23/21 0500  BP: (!) 151/82 138/77 (!) 94/51   Pulse: 69 69 61   Resp: 20 20 20    Temp: 97.6 F (36.4 C) 97.8 F (36.6 C) 97.9 F (36.6 C)   TempSrc: Oral Oral Oral   SpO2: 100% 100% 97%   Weight:    123.8 kg  Height:        Intake/Output Summary (Last 24 hours) at 03/23/2021 1136 Last data filed at 03/23/2021 1050 Gross per 24 hour  Intake 600 ml  Output 2350 ml  Net -1750 ml   Filed Weights   03/20/21 0436 03/21/21 0747 03/23/21 0500  Weight: 126.1 kg 123.3 kg 123.8 kg    Examination:  General exam: Appears calm and  comfortable, somnolent, obese  Respiratory system:  Clear to auscultation. Respiratory effort normal. Cardiovascular system: S1 & S2 heard, RRR.  Gastrointestinal system: Abdomen is soft Central nervous system: Alert and awake Extremities: No edema Skin: No significant lesions noted Psychiatry: Flat affect.    Data Reviewed: I have personally reviewed following labs and imaging studies  CBC: Recent Labs  Lab 03/16/21 2332 03/17/21 0607 03/19/21 0636 03/20/21 0438 03/21/21 0643 03/22/21 0651 03/23/21 0519  WBC 8.0   < > 6.2 4.4 3.8* 4.4 5.8  NEUTROABS 5.2  --   --   --   --   --   --   HGB 10.3*   < > 8.8* 8.6* 7.7* 7.9* 8.3*  HCT 30.9*   < > 26.7* 25.4* 23.0* 23.7* 25.7*  MCV 102.7*   < > 101.5* 98.8 100.0 101.3* 105.3*  PLT 96*   < > 74* 55* 48* 51* 59*   < > = values in this interval not displayed.   Basic Metabolic Panel: Recent Labs  Lab 03/19/21 0636 03/20/21 0438 03/21/21 0643 03/21/21 1609 03/22/21 0651 03/23/21 0519  NA 134* 127* 134*  --  135 137  K 4.9 5.4* 4.8  --  4.4 4.0  CL 104 99 105  --  106 107  CO2 22 20* 21*  --  21* 21*  GLUCOSE 167* 361* 207* 441* 140* 104*  BUN 41* 34* 38*  --  37* 37*  CREATININE 1.85* 1.58* 1.56*  --  1.40* 1.52*  CALCIUM 8.8* 8.7* 9.0  --  9.1 9.0  MG 2.2 1.8 1.8  --  1.8 1.9   GFR: Estimated Creatinine Clearance: 67.3 mL/min (A) (by C-G formula based on SCr of 1.52 mg/dL (H)). Liver Function Tests: Recent Labs  Lab 03/16/21 2332 03/17/21 0607 03/19/21 0636  AST 31 31 34  ALT 18 15 16   ALKPHOS 51 42 39  BILITOT 1.3* 1.0 1.5*  PROT 6.2* 5.6* 5.7*  ALBUMIN 3.2* 2.9* 2.8*   Recent Labs  Lab 03/16/21 2332 03/17/21 0607  LIPASE 37 32   Recent Labs  Lab 03/17/21 0607 03/18/21 0905 03/23/21 0519  AMMONIA 78* 55* 56*   Coagulation Profile: Recent Labs  Lab 03/16/21 2332  INR 1.1   Cardiac Enzymes: No results for input(s): CKTOTAL, CKMB, CKMBINDEX, TROPONINI in the last 168 hours. BNP (last 3 results) No results for input(s): PROBNP in the last 8760  hours. HbA1C: No results for input(s): HGBA1C in the last 72 hours. CBG: Recent Labs  Lab 03/22/21 1650 03/22/21 2032 03/23/21 0037 03/23/21 0406 03/23/21 0730  GLUCAP 202* 226* 159* 117* 86   Lipid Profile: No results for input(s): CHOL, HDL, LDLCALC, TRIG, CHOLHDL, LDLDIRECT in the last 72 hours. Thyroid Function Tests: No results for input(s): TSH, T4TOTAL, FREET4, T3FREE, THYROIDAB in the last 72 hours. Anemia Panel: Recent Labs    03/21/21 0803  VITAMINB12 470  FOLATE 7.8  FERRITIN 86  TIBC 331  IRON 32*  RETICCTPCT 6.0*   Sepsis Labs: Recent Labs  Lab 03/17/21 0232 03/17/21 0607 03/18/21 0905  LATICACIDVEN 1.4 2.1* 1.2    Recent Results (from the past 240 hour(s))  Resp Panel by RT-PCR (Flu A&B, Covid) Nasopharyngeal Swab     Status: None   Collection Time: 03/17/21  1:28 AM   Specimen: Nasopharyngeal Swab; Nasopharyngeal(NP) swabs in vial transport medium  Result Value Ref Range Status   SARS Coronavirus 2 by RT PCR NEGATIVE NEGATIVE Final    Comment: (NOTE) SARS-CoV-2 target  nucleic acids are NOT DETECTED.  The SARS-CoV-2 RNA is generally detectable in upper respiratory specimens during the acute phase of infection. The lowest concentration of SARS-CoV-2 viral copies this assay can detect is 138 copies/mL. A negative result does not preclude SARS-Cov-2 infection and should not be used as the sole basis for treatment or other patient management decisions. A negative result may occur with  improper specimen collection/handling, submission of specimen other than nasopharyngeal swab, presence of viral mutation(s) within the areas targeted by this assay, and inadequate number of viral copies(<138 copies/mL). A negative result must be combined with clinical observations, patient history, and epidemiological information. The expected result is Negative.  Fact Sheet for Patients:  EntrepreneurPulse.com.au  Fact Sheet for Healthcare  Providers:  IncredibleEmployment.be  This test is no t yet approved or cleared by the [place] FDA and  has been authorized for detection and/or diagnosis of SARS-CoV-2 by FDA under an Emergency Use Authorization (EUA). This EUA will remain  in effect (meaning this test can be used) for the duration of the COVID-19 declaration under Section 564(b)(1) of the Act, 21 U.S.C.section 360bbb-3(b)(1), unless the authorization is terminated  or revoked sooner.       Influenza A by PCR NEGATIVE NEGATIVE Final   Influenza B by PCR NEGATIVE NEGATIVE Final    Comment: (NOTE) The Xpert Xpress SARS-CoV-2/FLU/RSV plus assay is intended as an aid in the diagnosis of influenza from Nasopharyngeal swab specimens and should not be used as a sole basis for treatment. Nasal washings and aspirates are unacceptable for Xpert Xpress SARS-CoV-2/FLU/RSV testing.  Fact Sheet for Patients: EntrepreneurPulse.com.au  Fact Sheet for Healthcare Providers: IncredibleEmployment.be  This test is not yet approved or cleared by the [place] FDA and has been authorized for detection and/or diagnosis of SARS-CoV-2 by FDA under an Emergency Use Authorization (EUA). This EUA will remain in effect (meaning this test can be used) for the duration of the COVID-19 declaration under Section 564(b)(1) of the Act, 21 U.S.C. section 360bbb-3(b)(1), unless the authorization is terminated or revoked.  Performed at [facility], [address], [place], [place] [address]   MRSA PCR Screening     Status: None   Collection Time: 03/17/21  3:37 AM   Specimen: Nasal Mucosa; Nasopharyngeal  Result Value Ref Range Status   MRSA by PCR NEGATIVE NEGATIVE Final    Comment:        The GeneXpert MRSA Assay (FDA approved for NASAL specimens only), is one component of a comprehensive MRSA colonization surveillance program. It is not intended to diagnose MRSA infection  nor to guide or monitor treatment for MRSA infections. Performed at [facility], [address], [place], [place] [address]   Gastrointestinal Panel by PCR , Stool     Status: None   Collection Time: 03/20/21  1:38 PM   Specimen: Stool  Result Value Ref Range Status   Campylobacter species NOT DETECTED NOT DETECTED Final   Plesimonas shigelloides NOT DETECTED NOT DETECTED Final   Salmonella species NOT DETECTED NOT DETECTED Final   Yersinia enterocolitica NOT DETECTED NOT DETECTED Final   Vibrio species NOT DETECTED NOT DETECTED Final   Vibrio cholerae NOT DETECTED NOT DETECTED Final   Enteroaggregative E coli (EAEC) NOT DETECTED NOT DETECTED Final   Enteropathogenic E coli (EPEC) NOT DETECTED NOT DETECTED Final   Enterotoxigenic E coli (ETEC) NOT DETECTED NOT DETECTED Final   Shiga like toxin producing E coli (STEC) NOT DETECTED NOT DETECTED Final   Shigella/Enteroinvasive E  coli (EIEC) NOT DETECTED NOT DETECTED Final   Cryptosporidium NOT DETECTED NOT DETECTED Final   Cyclospora cayetanensis NOT DETECTED NOT DETECTED Final   Entamoeba histolytica NOT DETECTED NOT DETECTED Final   Giardia lamblia NOT DETECTED NOT DETECTED Final   Adenovirus F40/41 NOT DETECTED NOT DETECTED Final   Astrovirus NOT DETECTED NOT DETECTED Final   Norovirus GI/GII NOT DETECTED NOT DETECTED Final   Rotavirus A NOT DETECTED NOT DETECTED Final   Sapovirus (I, II, IV, and V) NOT DETECTED NOT DETECTED Final    Comment: Performed at [facility], [address], [place], [place] [address]         Radiology Studies: No results found.      Scheduled Meds: . aspirin EC  81 mg Oral QHS  . hydrocortisone  20 mg Oral BID  . insulin aspart  0-20 Units Subcutaneous Q4H  . insulin aspart  8 Units Subcutaneous TID WC  . insulin detemir  30 Units Subcutaneous BID  . lactulose  20 g Oral Daily  . lidocaine  1 patch Transdermal Q24H  . midodrine  15 mg Oral TID WC  . pantoprazole  40 mg Oral  Daily   Continuous Infusions: . sodium chloride 75 mL/hr at 03/23/21 0046     LOS: 6 days    Time spent: 35 minutes    Breannah Kratt Darleen Crocker, DO [facility]  If 7PM-7AM, please contact night-coverage [email or web address] 03/23/2021, 11:36 AM

## 2021-03-24 ENCOUNTER — Other Ambulatory Visit: Payer: Self-pay | Admitting: Nurse Practitioner

## 2021-03-24 DIAGNOSIS — N179 Acute kidney failure, unspecified: Secondary | ICD-10-CM | POA: Diagnosis not present

## 2021-03-24 DIAGNOSIS — I8500 Esophageal varices without bleeding: Secondary | ICD-10-CM

## 2021-03-24 DIAGNOSIS — K7460 Unspecified cirrhosis of liver: Secondary | ICD-10-CM

## 2021-03-24 LAB — GLUCOSE, CAPILLARY
Glucose-Capillary: 68 mg/dL — ABNORMAL LOW (ref 70–99)
Glucose-Capillary: 78 mg/dL (ref 70–99)

## 2021-03-24 MED ORDER — MIDODRINE HCL 5 MG PO TABS: 15.0000 mg | ORAL_TABLET | Freq: Three times a day (TID) | ORAL | 2 refills | Status: AC

## 2021-03-24 MED ORDER — HYDROCORTISONE 20 MG PO TABS: 20.0000 mg | ORAL_TABLET | Freq: Two times a day (BID) | ORAL | 3 refills | Status: AC

## 2021-03-24 MED ORDER — LIDOCAINE 5 % EX PTCH
1.0000 | MEDICATED_PATCH | CUTANEOUS | 0 refills | Status: DC
Start: 2021-03-25 — End: 2021-07-25

## 2021-03-24 NOTE — Plan of Care (Signed)
Alert and oriented x 4. Denies pain or discomfort. Continues on po steroids. Glucose levels checked q 4 hrs. Needs 1 person assist with transfers. Call bell within reach. Continue plan of care.  Problem: Education: Goal: Knowledge of General Education information will improve Description: Including pain rating scale, medication(s)/side effects and non-pharmacologic comfort measures Outcome: Progressing   Problem: Health Behavior/Discharge Planning: Goal: Ability to manage health-related needs will improve Outcome: Progressing   Problem: Clinical Measurements: Goal: Ability to maintain clinical measurements within normal limits will improve Outcome: Progressing Goal: Will remain free from infection Outcome: Progressing Goal: Diagnostic test results will improve Outcome: Progressing Goal: Respiratory complications will improve Outcome: Progressing Goal: Cardiovascular complication will be avoided Outcome: Progressing   Problem: Activity: Goal: Risk for activity intolerance will decrease Outcome: Progressing   Problem: Nutrition: Goal: Adequate nutrition will be maintained Outcome: Progressing   Problem: Coping: Goal: Level of anxiety will decrease Outcome: Progressing   Problem: Elimination: Goal: Will not experience complications related to bowel motility Outcome: Progressing Goal: Will not experience complications related to urinary retention Outcome: Progressing   Problem: Pain Managment: Goal: General experience of comfort will improve Outcome: Progressing   Problem: Safety: Goal: Ability to remain free from injury will improve Outcome: Progressing   Problem: Skin Integrity: Goal: Risk for impaired skin integrity will decrease Outcome: Progressing

## 2021-03-24 NOTE — Progress Notes (Signed)
Pt has discharge orders. Discharge instructions given and no further questions at this time. Pt wheeled down to main lobby by staff to vehicle accompanied by his brother.

## 2021-03-24 NOTE — TOC Transition Note (Signed)
Transition of Care [facility]) - CM/SW Discharge Note   Patient Details  Name: [name] MRN: [id number] Date of Birth: 25-Aug-1961  Transition of Care [facility]) CM/SW Contact:  Boneta Lucks, RN Phone Number: 03/24/2021, 10:37 AM   Clinical Narrative:  Patient medically ready to discharge home. MD ordering RN/PT. TOC checking with multiple agencies. Wife is requesting [facility]. Vaughan Basta was able to take referral with a pair. Add to AVS.    Final next level of care: [place] Barriers to Discharge: Barriers Resolved   Patient Goals and CMS Choice Patient states their goals for this hospitalization and ongoing recovery are:: to go home. CMS Medicare.gov Compare Post Acute Care list provided to:: Patient Represenative (must comment) Choice offered to / list presented to : Spouse  Discharge Placement            Patient and family notified of of transfer: 03/24/21  Discharge Plan and Services    HH Arranged: RN,PT [facility] Agency: [place] ([place]) Date [place]: 03/24/21 Time [place]: 0930 Representative spoke with at [place]: Romualdo Bolk  Readmission Risk Interventions Readmission Risk Prevention Plan 03/24/2021 12/10/2020  Transportation Screening Complete Complete  [place] or [place] - Complete  Social Work Consult for [place] Planning/Counseling - Complete  Palliative Care Screening - Not Applicable  Medication Review Press photographer) Complete Complete  PCP or Specialist appointment within 3-5 days of discharge Complete -  [place] or Home Care Consult Complete -  SW Recovery Care/Counseling Consult Complete -  Palliative Care Screening Not Applicable -  [place] Not Applicable -  Some recent data might be hidden

## 2021-03-24 NOTE — Discharge Summary (Signed)
Physician Discharge Summary  [name] MVH:[id number] DOB: December 04, 1961 DOA: 03/16/2021  PCP: Christopher Sites, MD  Admit date: 03/16/2021  Discharge date: 03/24/2021  Admitted From:Home  Disposition:  Home  Recommendations for Outpatient Follow-up:  1. Follow up with PCP in 1-2 weeks 2. Please obtain BMP/CBC in one week 3. Continue on midodrine and hydrocortisone as prescribed to support blood pressure 4. Hold home diuretics and beta-blocker for now until further follow-up regarding blood pressures 5. Follow-up with transplant center for potential liver transplant  Home Health: Yes with PT, RN  Equipment/Devices: None  Discharge Condition:Stable  CODE STATUS: Full  Diet recommendation: Heart Healthy/carb modified  Brief/Interim Summary:  [name] a59 y.o.male,with history of type 2 diabetes mellitus, thrombocytopenia, hypotension, hyperlipidemia, CKD, nonalcoholic cirrhosis, coronary artery disease status post CABG, and more presents to the ED with a chief complaint of back pain.Patient was admitted with AKI as well as hyperkalemia and possible mild pancreatitis that was noted on CT, but he is otherwise asymptomatic from this. He was also noted to have significant hyperglycemia and hypotension. AKI is improvingand blood glucose levels continue to fluctuate with the addition of steroids. Norepinephrine has been weaned.He was noted to have some ongoing diarrhea,which has now resolved.  His main concern throughout the course of the admission was hypotension that was discovered to be related to adrenal insufficiency.  He remains on high-dose midodrine 3 times daily as well as now hydrocortisone 20 mg twice daily.  His AKI has resolved and he is otherwise in stable condition for discharge after several days of admission and weaning off pressors.  No other complaints noted throughout the course of his stay.  Discharge Diagnoses:  Principal Problem:   AKI  (acute kidney injury) ([place]) Active Problems:   Type 2 diabetes mellitus with hyperglycemia (HCC)   Hyponatremia   Hypotension   Hyperkalemia   Elevated troponin   Back pain  Principal discharge diagnosis: AKI with multiple electrolyte abnormalities likely related to hypotension in the setting of adrenal insufficiency.  Hypoglycemia in the setting of type 2 diabetes.  Discharge Instructions  Discharge Instructions    Diet - low sodium heart healthy   Complete by: As directed    Increase activity slowly   Complete by: As directed      Allergies as of 03/24/2021      Reactions   Fluticasone Rash      Medication List    STOP taking these medications   nadolol 40 MG tablet Commonly known as: CORGARD   spironolactone 100 MG tablet Commonly known as: ALDACTONE   torsemide 20 MG tablet Commonly known as: DEMADEX     TAKE these medications   acetaminophen 325 MG tablet Commonly known as: TYLENOL Take 650 mg by mouth every 6 (six) hours as needed for mild pain.   albuterol 108 (90 Base) MCG/ACT inhaler Commonly known as: VENTOLIN HFA Inhale 2 puffs into the lungs every 6 (six) hours as needed for wheezing or shortness of breath.   ALPRAZolam 0.5 MG tablet Commonly known as: XANAX Take 1 tablet (0.5 mg total) by mouth daily as needed.   ALPRAZolam 0.25 MG tablet Commonly known as: XANAX Take 0.25 mg by mouth daily as needed for anxiety.   Armodafinil 200 MG Tabs Take 200 mg by mouth daily as needed (for sleepiness).   aspirin EC 81 MG tablet Take 81 mg by mouth at bedtime.   budesonide 180 MCG/ACT inhaler Commonly known as: PULMICORT Inhale 1 puff into the lungs daily.  Dialyvite Vitamin D 5000 125 MCG (5000 UT) capsule Generic drug: Cholecalciferol Take 5,000 Units by mouth in the morning.   Elderberry Zinc/Vit C/Immune Lozg Take 1 lozenge by mouth at bedtime.   ferrous sulfate 325 (65 FE) MG tablet Take 325 mg by mouth in the morning.   Flaxseed Oil  1000 MG Caps Take 1,000 mg by mouth at bedtime.   FreeStyle Libre 14 Day Reader [name] 1 reader   [employer] 14 Day Sensor Misc 1 sensor   hydrocortisone 20 MG tablet Commonly known as: CORTEF Take 1 tablet (20 mg total) by mouth 2 (two) times daily.   INS SYRINGE/NEEDLE .5CC/28G 28G X 1/2" 0.5 ML Misc Inject 1 Syringe into the skin See admin instructions.   insulin aspart 100 UNIT/ML injection Commonly known as: novoLOG Inject 5 Units into the skin 3 (three) times daily before meals. Special Instructions: If accu-check is greater than 150. Hold for accu-check 150 or below. With Meals   lactulose 10 GM/15ML solution Commonly known as: CHRONULAC Take 30 mLs (20 g total) by mouth daily.   lidocaine 5 % Commonly known as: LIDODERM Place 1 patch onto the skin daily. Remove & Discard patch within 12 hours or as directed by MD Start taking on: March 25, 2021   loratadine 10 MG tablet Commonly known as: CLARITIN Take 10 mg by mouth in the morning.   Magnesium 250 MG Tabs Take 250 mg by mouth daily.   Medical Compression Stockings Misc 1 each by Does not apply route as directed. Low pressure knee high compression stockings Dx: leg swelling   midodrine 5 MG tablet Commonly known as: PROAMATINE Take 3 tablets (15 mg total) by mouth 3 (three) times daily with meals.   NON FORMULARY Diet: _____ Regular, __x____ NAS, __x_____Consistent Carbohydrate, _______NPO _____Other   omeprazole 40 MG capsule Commonly known as: PRILOSEC Take 1 capsule (40 mg total) by mouth at bedtime.   Ozempic (1 MG/DOSE) 4 MG/3ML Sopn Generic drug: Semaglutide (1 MG/DOSE) Inject 1 mg into the skin once a week.   potassium chloride SA 20 MEQ tablet Commonly known as: KLOR-CON Take 20 mEq by mouth 2 (two) times daily.   pravastatin 20 MG tablet Commonly known as: PRAVACHOL TAKE 1 TABLET(20 MG) BY MOUTH AT BEDTIME What changed:   how much to take  how to take this  when to take  this  additional instructions   Tresiba FlexTouch 200 UNIT/ML FlexTouch Pen Generic drug: insulin degludec Inject 40 Units into the skin in the morning. Give 30 units Subcutaneous  at Bedtime 8:00 pm   TURMERIC CURCUMIN PO Take 2,000 mg by mouth at bedtime.       Follow-up Information    Christopher Sites, MD. Schedule an appointment as soon as possible for a visit in 1 week(s).   Specialty: Family Medicine Contact information: [address] [place] [address] [phone]              Allergies  Allergen Reactions  . Fluticasone Rash    Consultations:  None   Procedures/Studies: CT CERVICAL SPINE WO CONTRAST  Result Date: 03/17/2021 CLINICAL DATA:  Neck pain EXAM: CT CERVICAL SPINE WITHOUT CONTRAST TECHNIQUE: Multidetector CT imaging of the cervical spine was performed without intravenous contrast. Multiplanar CT image reconstructions were also generated. COMPARISON:  None. FINDINGS: Alignment: No subluxation. Skull base and vertebrae: No acute fracture. No primary bone lesion or focal pathologic process. Soft tissues and spinal canal: No prevertebral fluid or swelling. No visible  canal hematoma. Disc levels:  Disc spaces maintained. Upper chest: Negative Other: None IMPRESSION: No acute bony abnormality. Electronically Signed   By: Rolm Baptise M.D.   On: 03/17/2021 01:05   CT L-SPINE NO CHARGE  Result Date: 03/17/2021 CLINICAL DATA:  Initial evaluation for acute severe lower back pain. EXAM: CT LUMBAR SPINE WITHOUT CONTRAST TECHNIQUE: Multidetector CT imaging of the lumbar spine was performed without intravenous contrast administration. Multiplanar CT image reconstructions were also generated. COMPARISON:  None available. FINDINGS: Segmentation: Standard. Lowest well-formed disc space labeled the L5-S1 level. Alignment: Trace anterolisthesis of L4 on L5, chronic and facet mediated. Alignment otherwise normal preservation of the normal lumbar lordosis. Vertebrae:  Vertebral body height maintained without evidence for acute or chronic fracture. Visualized sacrum and pelvis intact. SI joints approximated symmetric. No discrete or worrisome osseous lesions. Paraspinal and other soft tissues: Paraspinous soft tissues demonstrate no acute finding. Disc levels: L1-2:  Unremarkable. L2-3: Mild disc bulge, slightly eccentric to the right. No spinal stenosis. Foramina remain patent. L3-4: Mild diffuse disc bulge with annular calcification. Superimposed right foraminal to extraforaminal disc protrusion closely approximates and potentially affects the exiting right L3 nerve root (series 7, image 66). Superimposed mild facet hypertrophy. No more than mild spinal stenosis. Mild bilateral L3 foraminal narrowing. L4-5: Trace anterolisthesis. Diffuse disc bulge, slightly asymmetric to the right. Mild to moderate facet and ligament flavum hypertrophy. Resultant moderate canal with bilateral subarticular stenosis. Moderate right worse than left L4 foraminal narrowing. L5-S1: Mild disc bulge. Superimposed left foraminal disc protrusion closely approximates and could potentially affect the exiting left L5 nerve root (series 7, image 97). Mild to moderate bilateral facet hypertrophy. No significant canal or lateral recess stenosis. Mild right with moderate left L5 foraminal narrowing. IMPRESSION: 1. No acute osseous abnormality within the lumbar spine. 2. Trace anterolisthesis of L4 on L5 with associated disc bulge and facet hypertrophy, resulting in moderate canal with bilateral subarticular stenosis. 3. Right foraminal to extraforaminal disc protrusion at L3-4, closely approximating and potentially affecting the exiting right L3 nerve root. 4. Left foraminal disc protrusion at L5-S1, potentially affecting the exiting left L5 nerve root. 5. Mild-to-moderate lower lumbar facet arthropathy. Finding could contribute to lower back pain. Electronically Signed   By: Jeannine Boga M.D.   On:  03/17/2021 01:13   DG CHEST PORT 1 VIEW  Result Date: 03/17/2021 CLINICAL DATA:  Central line placement EXAM: PORTABLE CHEST 1 VIEW COMPARISON:  12/09/2020 FINDINGS: Right IJ venous catheter terminates at the cavoatrial junction. Pulmonary vascular congestion without frank interstitial edema. No pleural effusion or pneumothorax. The heart is normal in size. Postsurgical changes related to prior CABG. Median sternotomy. IMPRESSION: Right IJ venous catheter terminates at the cavoatrial junction. No pneumothorax. Electronically Signed   By: Julian Hy M.D.   On: 03/17/2021 16:10   CT Renal Stone Study  Result Date: 03/17/2021 CLINICAL DATA:  Severe low back pain and neck pain.  No injury. EXAM: CT ABDOMEN AND PELVIS WITHOUT CONTRAST TECHNIQUE: Multidetector CT imaging of the abdomen and pelvis was performed following the standard protocol without IV contrast. COMPARISON:  05/16/2016 FINDINGS: Lower chest: Previous sternotomy.  Lung bases are clear. Hepatobiliary: Probable hepatic cirrhosis with nodular liver contour and enlarged lateral segment left lobe. No focal lesions are identified on noncontrast imaging. Cholelithiasis. No bile duct dilatation. Pancreas: Edema and stranding in the peripancreatic fat. No loculated collections. No pancreatic ductal dilatation. Spleen: Normal in size without focal abnormality. Adrenals/Urinary Tract: Adrenal glands are unremarkable. Kidneys are  normal, without renal calculi, focal lesion, or hydronephrosis. Bladder is unremarkable. Stomach/Bowel: Stomach, small bowel, and colon are not abnormally distended. No wall thickening or inflammatory changes appreciated. Appendix is not identified. Vascular/Lymphatic: Aortic atherosclerosis. No enlarged abdominal or pelvic lymph nodes. Reproductive: Prostate is unremarkable. Other: No free air or free fluid in the abdomen. Abdominal wall musculature appears intact. Musculoskeletal: Normal alignment of the lumbar spine.  Degenerative changes most prominent in the lower thoracic spine. No destructive bone lesions. IMPRESSION: 1. Edema and stranding in the peripancreatic fat consistent with acute pancreatitis. No loculated collections. 2. Probable hepatic cirrhosis. 3. Cholelithiasis without evidence of cholecystitis. 4. Aortic atherosclerosis. Aortic Atherosclerosis (ICD10-I70.0). Electronically Signed   By: Lucienne Capers M.D.   On: 03/17/2021 00:49      Discharge Exam: Vitals:   03/24/21 0517 03/24/21 0935  BP: (!) 106/59 118/68  Pulse: 72 70  Resp: 16 18  Temp: 98.2 F (36.8 C) 97.7 F (36.5 C)  SpO2: 100% 100%   Vitals:   03/23/21 1353 03/23/21 2153 03/24/21 0517 03/24/21 0935  BP: 123/64 (!) 144/75 (!) 106/59 118/68  Pulse: 66 66 72 70  Resp: 16 18 16 18   Temp: 97.8 F (36.6 C) 97.6 F (36.4 C) 98.2 F (36.8 C) 97.7 F (36.5 C)  TempSrc: Oral Oral Oral Oral  SpO2: 99% 100% 100% 100%  Weight:   129.8 kg   Height:        General: Pt is alert, awake, not in acute distress, obese Cardiovascular: RRR, S1/S2 +, no rubs, no gallops Respiratory: CTA bilaterally, no wheezing, no rhonchi Abdominal: Soft, NT, ND, bowel sounds + Extremities: no edema, no cyanosis    The results of significant diagnostics from this hospitalization (including imaging, microbiology, ancillary and laboratory) are listed below for reference.     Microbiology: Recent Results (from the past 240 hour(s))  Resp Panel by RT-PCR (Flu A&B, Covid) Nasopharyngeal Swab     Status: None   Collection Time: 03/17/21  1:28 AM   Specimen: Nasopharyngeal Swab; Nasopharyngeal(NP) swabs in vial transport medium  Result Value Ref Range Status   SARS Coronavirus 2 by RT PCR NEGATIVE NEGATIVE Final    Comment: (NOTE) SARS-CoV-2 target nucleic acids are NOT DETECTED.  The SARS-CoV-2 RNA is generally detectable in upper respiratory specimens during the acute phase of infection. The lowest concentration of SARS-CoV-2 viral copies  this assay can detect is 138 copies/mL. A negative result does not preclude SARS-Cov-2 infection and should not be used as the sole basis for treatment or other patient management decisions. A negative result may occur with  improper specimen collection/handling, submission of specimen other than nasopharyngeal swab, presence of viral mutation(s) within the areas targeted by this assay, and inadequate number of viral copies(<138 copies/mL). A negative result must be combined with clinical observations, patient history, and epidemiological information. The expected result is Negative.  Fact Sheet for Patients:  EntrepreneurPulse.com.au  Fact Sheet for Healthcare Providers:  IncredibleEmployment.be  This test is no t yet approved or cleared by the [place] FDA and  has been authorized for detection and/or diagnosis of SARS-CoV-2 by FDA under an Emergency Use Authorization (EUA). This EUA will remain  in effect (meaning this test can be used) for the duration of the COVID-19 declaration under Section 564(b)(1) of the Act, 21 U.S.C.section 360bbb-3(b)(1), unless the authorization is terminated  or revoked sooner.       Influenza A by PCR NEGATIVE NEGATIVE Final   Influenza B by PCR NEGATIVE  NEGATIVE Final    Comment: (NOTE) The Xpert Xpress SARS-CoV-2/FLU/RSV plus assay is intended as an aid in the diagnosis of influenza from Nasopharyngeal swab specimens and should not be used as a sole basis for treatment. Nasal washings and aspirates are unacceptable for Xpert Xpress SARS-CoV-2/FLU/RSV testing.  Fact Sheet for Patients: EntrepreneurPulse.com.au  Fact Sheet for Healthcare Providers: IncredibleEmployment.be  This test is not yet approved or cleared by the [place] FDA and has been authorized for detection and/or diagnosis of SARS-CoV-2 by FDA under an Emergency Use Authorization (EUA). This EUA  will remain in effect (meaning this test can be used) for the duration of the COVID-19 declaration under Section 564(b)(1) of the Act, 21 U.S.C. section 360bbb-3(b)(1), unless the authorization is terminated or revoked.  Performed at [facility], [address] [place], [place] [address]   MRSA PCR Screening     Status: None   Collection Time: 03/17/21  3:37 AM   Specimen: Nasal Mucosa; Nasopharyngeal  Result Value Ref Range Status   MRSA by PCR NEGATIVE NEGATIVE Final    Comment:        The GeneXpert MRSA Assay (FDA approved for NASAL specimens only), is one component of a comprehensive MRSA colonization surveillance program. It is not intended to diagnose MRSA infection nor to guide or monitor treatment for MRSA infections. Performed at [facility], [address], [place], [place] [address]   Gastrointestinal Panel by PCR , Stool     Status: None   Collection Time: 03/20/21  1:38 PM   Specimen: Stool  Result Value Ref Range Status   Campylobacter species NOT DETECTED NOT DETECTED Final   Plesimonas shigelloides NOT DETECTED NOT DETECTED Final   Salmonella species NOT DETECTED NOT DETECTED Final   Yersinia enterocolitica NOT DETECTED NOT DETECTED Final   Vibrio species NOT DETECTED NOT DETECTED Final   Vibrio cholerae NOT DETECTED NOT DETECTED Final   Enteroaggregative E coli (EAEC) NOT DETECTED NOT DETECTED Final   Enteropathogenic E coli (EPEC) NOT DETECTED NOT DETECTED Final   Enterotoxigenic E coli (ETEC) NOT DETECTED NOT DETECTED Final   Shiga like toxin producing E coli (STEC) NOT DETECTED NOT DETECTED Final   Shigella/Enteroinvasive E coli (EIEC) NOT DETECTED NOT DETECTED Final   Cryptosporidium NOT DETECTED NOT DETECTED Final   Cyclospora cayetanensis NOT DETECTED NOT DETECTED Final   Entamoeba histolytica NOT DETECTED NOT DETECTED Final   Giardia lamblia NOT DETECTED NOT DETECTED Final   Adenovirus F40/41 NOT DETECTED NOT DETECTED Final   Astrovirus NOT  DETECTED NOT DETECTED Final   Norovirus GI/GII NOT DETECTED NOT DETECTED Final   Rotavirus A NOT DETECTED NOT DETECTED Final   Sapovirus (I, II, IV, and V) NOT DETECTED NOT DETECTED Final    Comment: Performed at [facility], [address] [place], [place] [address]     Labs: BNP (last 3 results) Recent Labs    06/20/20 1800  BNP 350.0*   Basic Metabolic Panel: Recent Labs  Lab 03/19/21 0636 03/20/21 0438 03/21/21 0643 03/21/21 1609 03/22/21 0651 03/23/21 0519  NA 134* 127* 134*  --  135 137  K 4.9 5.4* 4.8  --  4.4 4.0  CL 104 99 105  --  106 107  CO2 22 20* 21*  --  21* 21*  GLUCOSE 167* 361* 207* 441* 140* 104*  BUN 41* 34* 38*  --  37* 37*  CREATININE 1.85* 1.58* 1.56*  --  1.40* 1.52*  CALCIUM 8.8* 8.7* 9.0  --  9.1 9.0  MG 2.2 1.8 1.8  --  1.8 1.9   Liver Function Tests: Recent Labs  Lab 03/19/21 0636  AST 34  ALT 16  ALKPHOS 39  BILITOT 1.5*  PROT 5.7*  ALBUMIN 2.8*   No results for input(s): LIPASE, AMYLASE in the last 168 hours. Recent Labs  Lab 03/18/21 0905 03/23/21 0519  AMMONIA 55* 56*   CBC: Recent Labs  Lab 03/19/21 0636 03/20/21 0438 03/21/21 0643 03/22/21 0651 03/23/21 0519  WBC 6.2 4.4 3.8* 4.4 5.8  HGB 8.8* 8.6* 7.7* 7.9* 8.3*  HCT 26.7* 25.4* 23.0* 23.7* 25.7*  MCV 101.5* 98.8 100.0 101.3* 105.3*  PLT 74* 55* 48* 51* 59*   Cardiac Enzymes: No results for input(s): CKTOTAL, CKMB, CKMBINDEX, TROPONINI in the last 168 hours. BNP: Invalid input(s): POCBNP CBG: Recent Labs  Lab 03/23/21 1638 03/23/21 2039 03/23/21 2330 03/24/21 0429 03/24/21 0738  GLUCAP 156* 160* 115* 68* 78   D-Dimer No results for input(s): DDIMER in the last 72 hours. Hgb A1c No results for input(s): HGBA1C in the last 72 hours. Lipid Profile No results for input(s): CHOL, HDL, LDLCALC, TRIG, CHOLHDL, LDLDIRECT in the last 72 hours. Thyroid function studies No results for input(s): TSH, T4TOTAL, T3FREE, THYROIDAB in the last 72  hours.  Invalid input(s): FREET3 Anemia work up No results for input(s): VITAMINB12, FOLATE, FERRITIN, TIBC, IRON, RETICCTPCT in the last 72 hours. Urinalysis    Component Value Date/Time   COLORURINE YELLOW 12/09/2020 1700   APPEARANCEUR CLEAR 12/09/2020 1700   LABSPEC 1.008 12/09/2020 1700   PHURINE 5.0 12/09/2020 1700   GLUCOSEU NEGATIVE 12/09/2020 1700   HGBUR NEGATIVE 12/09/2020 1700   BILIRUBINUR NEGATIVE 12/09/2020 1700   KETONESUR NEGATIVE 12/09/2020 1700   PROTEINUR NEGATIVE 12/09/2020 1700   UROBILINOGEN 1.0 08/28/2015 1634   NITRITE NEGATIVE 12/09/2020 1700   LEUKOCYTESUR NEGATIVE 12/09/2020 1700   Sepsis Labs Invalid input(s): PROCALCITONIN,  WBC,  LACTICIDVEN Microbiology Recent Results (from the past 240 hour(s))  Resp Panel by RT-PCR (Flu A&B, Covid) Nasopharyngeal Swab     Status: None   Collection Time: 03/17/21  1:28 AM   Specimen: Nasopharyngeal Swab; Nasopharyngeal(NP) swabs in vial transport medium  Result Value Ref Range Status   SARS Coronavirus 2 by RT PCR NEGATIVE NEGATIVE Final    Comment: (NOTE) SARS-CoV-2 target nucleic acids are NOT DETECTED.  The SARS-CoV-2 RNA is generally detectable in upper respiratory specimens during the acute phase of infection. The lowest concentration of SARS-CoV-2 viral copies this assay can detect is 138 copies/mL. A negative result does not preclude SARS-Cov-2 infection and should not be used as the sole basis for treatment or other patient management decisions. A negative result may occur with  improper specimen collection/handling, submission of specimen other than nasopharyngeal swab, presence of viral mutation(s) within the areas targeted by this assay, and inadequate number of viral copies(<138 copies/mL). A negative result must be combined with clinical observations, patient history, and epidemiological information. The expected result is Negative.  Fact Sheet for Patients:   EntrepreneurPulse.com.au  Fact Sheet for Healthcare Providers:  IncredibleEmployment.be  This test is no t yet approved or cleared by the [place] FDA and  has been authorized for detection and/or diagnosis of SARS-CoV-2 by FDA under an Emergency Use Authorization (EUA). This EUA will remain  in effect (meaning this test can be used) for the duration of the COVID-19 declaration under Section 564(b)(1) of the Act, 21 U.S.C.section 360bbb-3(b)(1), unless the authorization is terminated  or revoked  sooner.       Influenza A by PCR NEGATIVE NEGATIVE Final   Influenza B by PCR NEGATIVE NEGATIVE Final    Comment: (NOTE) The Xpert Xpress SARS-CoV-2/FLU/RSV plus assay is intended as an aid in the diagnosis of influenza from Nasopharyngeal swab specimens and should not be used as a sole basis for treatment. Nasal washings and aspirates are unacceptable for Xpert Xpress SARS-CoV-2/FLU/RSV testing.  Fact Sheet for Patients: EntrepreneurPulse.com.au  Fact Sheet for Healthcare Providers: IncredibleEmployment.be  This test is not yet approved or cleared by the [place] FDA and has been authorized for detection and/or diagnosis of SARS-CoV-2 by FDA under an Emergency Use Authorization (EUA). This EUA will remain in effect (meaning this test can be used) for the duration of the COVID-19 declaration under Section 564(b)(1) of the Act, 21 U.S.C. section 360bbb-3(b)(1), unless the authorization is terminated or revoked.  Performed at [facility], [address], [place], [place] [address]   MRSA PCR Screening     Status: None   Collection Time: 03/17/21  3:37 AM   Specimen: Nasal Mucosa; Nasopharyngeal  Result Value Ref Range Status   MRSA by PCR NEGATIVE NEGATIVE Final    Comment:        The GeneXpert MRSA Assay (FDA approved for NASAL specimens only), is one component of a comprehensive MRSA  colonization surveillance program. It is not intended to diagnose MRSA infection nor to guide or monitor treatment for MRSA infections. Performed at [facility], [address], [place], [place] [address]   Gastrointestinal Panel by PCR , Stool     Status: None   Collection Time: 03/20/21  1:38 PM   Specimen: Stool  Result Value Ref Range Status   Campylobacter species NOT DETECTED NOT DETECTED Final   Plesimonas shigelloides NOT DETECTED NOT DETECTED Final   Salmonella species NOT DETECTED NOT DETECTED Final   Yersinia enterocolitica NOT DETECTED NOT DETECTED Final   Vibrio species NOT DETECTED NOT DETECTED Final   Vibrio cholerae NOT DETECTED NOT DETECTED Final   Enteroaggregative E coli (EAEC) NOT DETECTED NOT DETECTED Final   Enteropathogenic E coli (EPEC) NOT DETECTED NOT DETECTED Final   Enterotoxigenic E coli (ETEC) NOT DETECTED NOT DETECTED Final   Shiga like toxin producing E coli (STEC) NOT DETECTED NOT DETECTED Final   Shigella/Enteroinvasive E coli (EIEC) NOT DETECTED NOT DETECTED Final   Cryptosporidium NOT DETECTED NOT DETECTED Final   Cyclospora cayetanensis NOT DETECTED NOT DETECTED Final   Entamoeba histolytica NOT DETECTED NOT DETECTED Final   Giardia lamblia NOT DETECTED NOT DETECTED Final   Adenovirus F40/41 NOT DETECTED NOT DETECTED Final   Astrovirus NOT DETECTED NOT DETECTED Final   Norovirus GI/GII NOT DETECTED NOT DETECTED Final   Rotavirus A NOT DETECTED NOT DETECTED Final   Sapovirus (I, II, IV, and V) NOT DETECTED NOT DETECTED Final    Comment: Performed at [facility], [address] [place], [place] [address]     Time coordinating discharge: 35 minutes  SIGNED:   Rodena Goldmann, DO [facility] 03/24/2021, 10:14 AM  If 7PM-7AM, please contact night-coverage [email or web address]

## 2021-03-24 NOTE — Progress Notes (Signed)
Physical Therapy Treatment Patient Details Name: [name] MRN: [id number] DOB: [birth year] Today's Date: 03/24/2021    History of Present Illness [name]  is a 60 y.o. male, with history of type 2 diabetes mellitus, thrombocytopenia, hypotension, hyperlipidemia, CKD, nonalcoholic cirrhosis, coronary artery disease status post CABG, and more presents to the ED with a chief complaint of back pain.  Patient reports that the pain was acute in onset in his lower back yesterday.  It got gradually worse through the night.  He reports that at its worst it was a 10 out of 10 and right now it is an 8 out of 10.  He reports that he attempted slouched walking to relieve the pain, but did not made his neck hurt.  He did not try any pain medications as he is very careful with medications due to his cirrhosis and kidney disease.  He reports that he attempted sitting straight up in a chair and that seemed to help the pain.  He tried Biofreeze and that helped and then he was able to get to sleep.  Patient reports when he woke up this morning the pain was much worse and he can even walk.  He reports no recent falls or trauma to his back.  The pain is not on one side more than the other but across the whole back.  Pain is not associated with with voiding.    PT Comments    Great participation in PT. Demo transfers without PT support, but required supervision and guarding throughout. Demo 1 initial LOB when transitioning to stand and putting on belt, but able to regain balance with support from bed rail and continue donning belt independently. Cont difficulty with endurance and fatigue, especially noted in marching. RN notified of patient progress and that patient in chair. Patient will benefit from continued physical therapy in hospital and recommended venue below to increase strength, balance, endurance for safe ADLs and gait.    Follow Up Recommendations  Home health PT;Supervision for  mobility/OOB;Supervision - Intermittent     Equipment Recommendations  None recommended by PT    Recommendations for Other Services       Precautions / Restrictions Precautions Precautions: Fall Restrictions Weight Bearing Restrictions: No    Mobility  Bed Mobility Overal bed mobility: Modified Independent                  Transfers Overall transfer level: Needs assistance Equipment used: Rolling walker (2 wheeled) Transfers: Sit to/from [employer] Transfers Sit to Stand: Supervision Stand pivot transfers: Supervision       General transfer comment: increased time, slightly labored movement  Ambulation/Gait Ambulation/Gait assistance: Supervision Gait Distance (Feet): 80 Feet Assistive device: Rolling walker (2 wheeled) Gait Pattern/deviations: Decreased step length - left;Decreased stance time - right;Decreased stride length Gait velocity: decreased   General Gait Details: slightly labored cadence without loss of balance, limited mostly due to fatigue   Stairs             Wheelchair Mobility    Modified Rankin (Stroke Patients Only)       Balance Overall balance assessment: Needs assistance Sitting-balance support: Feet supported;No upper extremity supported Sitting balance-Leahy Scale: Good Sitting balance - Comments: seated at EOB   Standing balance support: No upper extremity supported;During functional activity Standing balance-Leahy Scale: Fair Standing balance comment: static stance at EOB putting on belt, 1 LOB initially then good for rest of time.  Cognition Arousal/Alertness: Awake/alert Behavior During Therapy: WFL for tasks assessed/performed Overall Cognitive Status: Within Functional Limits for tasks assessed                                        Exercises General Exercises - Lower Extremity Long Arc Quad: AROM;Strengthening;Both;10 reps;Seated Hip  Flexion/Marching: AROM;Strengthening;Both;10 reps;Standing Toe Raises: AROM;Strengthening;Both;10 reps;Standing Heel Raises: AROM;Strengthening;Both;10 reps;Standing    General Comments        Pertinent Vitals/Pain Pain Assessment: No/denies pain    Home Living                      Prior Function            PT Goals (current goals can now be found in the care plan section) Acute Rehab PT Goals Patient Stated Goal: return home with family to assist PT Goal Formulation: With patient Time For Goal Achievement: 03/27/21 Potential to Achieve Goals: Good Progress towards PT goals: Progressing toward goals    Frequency    Min 3X/week      PT Plan      Co-evaluation              AM-PAC PT "6 Clicks" Mobility   Outcome Measure  Help needed turning from your back to your side while in a flat bed without using bedrails?: None Help needed moving from lying on your back to sitting on the side of a flat bed without using bedrails?: None Help needed moving to and from a bed to a chair (including a wheelchair)?: A Little Help needed standing up from a chair using your arms (e.g., wheelchair or bedside chair)?: A Little Help needed to walk in hospital room?: A Little Help needed climbing 3-5 steps with a railing? : A Little 6 Click Score: 20    End of Session   Activity Tolerance: Patient tolerated treatment well;Patient limited by fatigue Patient left: in chair;with call bell/phone within reach Nurse Communication: Mobility status PT Visit Diagnosis: Unsteadiness on feet (R26.81);Other abnormalities of gait and mobility (R26.89);Muscle weakness (generalized) (M62.81)     Time: 6606-3016 PT Time Calculation (min) (ACUTE ONLY): 15 min  Charges:  $Therapeutic Activity: 8-22 mins                     9:25 AM,03/24/21 Domenic Moras, PT, DPT Physical Therapist at [facility]

## 2021-03-26 ENCOUNTER — Ambulatory Visit: Payer: Federal, State, Local not specified - PPO | Admitting: Adult Health

## 2021-03-26 DIAGNOSIS — I959 Hypotension, unspecified: Secondary | ICD-10-CM | POA: Diagnosis not present

## 2021-03-26 DIAGNOSIS — E1165 Type 2 diabetes mellitus with hyperglycemia: Secondary | ICD-10-CM | POA: Diagnosis not present

## 2021-03-26 DIAGNOSIS — I2510 Atherosclerotic heart disease of native coronary artery without angina pectoris: Secondary | ICD-10-CM | POA: Diagnosis not present

## 2021-03-26 DIAGNOSIS — R748 Abnormal levels of other serum enzymes: Secondary | ICD-10-CM | POA: Diagnosis not present

## 2021-03-26 DIAGNOSIS — E875 Hyperkalemia: Secondary | ICD-10-CM | POA: Diagnosis not present

## 2021-03-26 DIAGNOSIS — E1122 Type 2 diabetes mellitus with diabetic chronic kidney disease: Secondary | ICD-10-CM | POA: Diagnosis not present

## 2021-03-26 DIAGNOSIS — N189 Chronic kidney disease, unspecified: Secondary | ICD-10-CM | POA: Diagnosis not present

## 2021-03-26 DIAGNOSIS — D631 Anemia in chronic kidney disease: Secondary | ICD-10-CM | POA: Diagnosis not present

## 2021-03-26 DIAGNOSIS — E871 Hypo-osmolality and hyponatremia: Secondary | ICD-10-CM | POA: Diagnosis not present

## 2021-03-26 DIAGNOSIS — I5030 Unspecified diastolic (congestive) heart failure: Secondary | ICD-10-CM | POA: Diagnosis not present

## 2021-03-26 DIAGNOSIS — M549 Dorsalgia, unspecified: Secondary | ICD-10-CM | POA: Diagnosis not present

## 2021-03-26 DIAGNOSIS — I130 Hypertensive heart and chronic kidney disease with heart failure and stage 1 through stage 4 chronic kidney disease, or unspecified chronic kidney disease: Secondary | ICD-10-CM | POA: Diagnosis not present

## 2021-03-26 DIAGNOSIS — N179 Acute kidney failure, unspecified: Secondary | ICD-10-CM | POA: Diagnosis not present

## 2021-03-30 DIAGNOSIS — Z6838 Body mass index (BMI) 38.0-38.9, adult: Secondary | ICD-10-CM | POA: Diagnosis not present

## 2021-03-30 DIAGNOSIS — E1165 Type 2 diabetes mellitus with hyperglycemia: Secondary | ICD-10-CM | POA: Diagnosis not present

## 2021-03-30 DIAGNOSIS — R748 Abnormal levels of other serum enzymes: Secondary | ICD-10-CM | POA: Diagnosis not present

## 2021-03-30 DIAGNOSIS — I2510 Atherosclerotic heart disease of native coronary artery without angina pectoris: Secondary | ICD-10-CM | POA: Diagnosis not present

## 2021-03-30 DIAGNOSIS — D631 Anemia in chronic kidney disease: Secondary | ICD-10-CM | POA: Diagnosis not present

## 2021-03-30 DIAGNOSIS — E875 Hyperkalemia: Secondary | ICD-10-CM | POA: Diagnosis not present

## 2021-03-30 DIAGNOSIS — I959 Hypotension, unspecified: Secondary | ICD-10-CM | POA: Diagnosis not present

## 2021-03-30 DIAGNOSIS — E1122 Type 2 diabetes mellitus with diabetic chronic kidney disease: Secondary | ICD-10-CM | POA: Diagnosis not present

## 2021-03-30 DIAGNOSIS — N189 Chronic kidney disease, unspecified: Secondary | ICD-10-CM | POA: Diagnosis not present

## 2021-03-30 DIAGNOSIS — N179 Acute kidney failure, unspecified: Secondary | ICD-10-CM | POA: Diagnosis not present

## 2021-03-30 DIAGNOSIS — I130 Hypertensive heart and chronic kidney disease with heart failure and stage 1 through stage 4 chronic kidney disease, or unspecified chronic kidney disease: Secondary | ICD-10-CM | POA: Diagnosis not present

## 2021-03-30 DIAGNOSIS — E871 Hypo-osmolality and hyponatremia: Secondary | ICD-10-CM | POA: Diagnosis not present

## 2021-03-30 DIAGNOSIS — D649 Anemia, unspecified: Secondary | ICD-10-CM | POA: Diagnosis not present

## 2021-03-30 DIAGNOSIS — I5030 Unspecified diastolic (congestive) heart failure: Secondary | ICD-10-CM | POA: Diagnosis not present

## 2021-03-30 DIAGNOSIS — M549 Dorsalgia, unspecified: Secondary | ICD-10-CM | POA: Diagnosis not present

## 2021-03-31 ENCOUNTER — Other Ambulatory Visit: Payer: Self-pay

## 2021-03-31 ENCOUNTER — Inpatient Hospital Stay (HOSPITAL_COMMUNITY): Payer: Federal, State, Local not specified - PPO | Attending: Hematology

## 2021-03-31 DIAGNOSIS — I129 Hypertensive chronic kidney disease with stage 1 through stage 4 chronic kidney disease, or unspecified chronic kidney disease: Secondary | ICD-10-CM | POA: Diagnosis not present

## 2021-03-31 DIAGNOSIS — Z809 Family history of malignant neoplasm, unspecified: Secondary | ICD-10-CM | POA: Insufficient documentation

## 2021-03-31 DIAGNOSIS — E119 Type 2 diabetes mellitus without complications: Secondary | ICD-10-CM | POA: Insufficient documentation

## 2021-03-31 DIAGNOSIS — Z79899 Other long term (current) drug therapy: Secondary | ICD-10-CM | POA: Diagnosis not present

## 2021-03-31 DIAGNOSIS — N183 Chronic kidney disease, stage 3 unspecified: Secondary | ICD-10-CM | POA: Diagnosis not present

## 2021-03-31 DIAGNOSIS — D696 Thrombocytopenia, unspecified: Secondary | ICD-10-CM

## 2021-03-31 DIAGNOSIS — I2510 Atherosclerotic heart disease of native coronary artery without angina pectoris: Secondary | ICD-10-CM | POA: Diagnosis not present

## 2021-03-31 DIAGNOSIS — E2740 Unspecified adrenocortical insufficiency: Secondary | ICD-10-CM | POA: Diagnosis not present

## 2021-03-31 DIAGNOSIS — E785 Hyperlipidemia, unspecified: Secondary | ICD-10-CM | POA: Diagnosis not present

## 2021-03-31 LAB — CBC WITH DIFFERENTIAL/PLATELET
Abs Immature Granulocytes: 0.02 10*3/uL (ref 0.00–0.07)
Basophils Absolute: 0 10*3/uL (ref 0.0–0.1)
Basophils Relative: 0 %
Eosinophils Absolute: 0.1 10*3/uL (ref 0.0–0.5)
Eosinophils Relative: 2 %
HCT: 34.3 % — ABNORMAL LOW (ref 39.0–52.0)
Hemoglobin: 11 g/dL — ABNORMAL LOW (ref 13.0–17.0)
Immature Granulocytes: 0 %
Lymphocytes Relative: 10 %
Lymphs Abs: 0.7 10*3/uL (ref 0.7–4.0)
MCH: 33.7 pg (ref 26.0–34.0)
MCHC: 32.1 g/dL (ref 30.0–36.0)
MCV: 105.2 fL — ABNORMAL HIGH (ref 80.0–100.0)
Monocytes Absolute: 0.6 10*3/uL (ref 0.1–1.0)
Monocytes Relative: 9 %
Neutro Abs: 5.7 10*3/uL (ref 1.7–7.7)
Neutrophils Relative %: 79 %
Platelets: 75 10*3/uL — ABNORMAL LOW (ref 150–400)
RBC: 3.26 MIL/uL — ABNORMAL LOW (ref 4.22–5.81)
RDW: 17.1 % — ABNORMAL HIGH (ref 11.5–15.5)
WBC: 7.2 10*3/uL (ref 4.0–10.5)
nRBC: 0 % (ref 0.0–0.2)

## 2021-03-31 LAB — IRON AND TIBC
Iron: 67 ug/dL (ref 45–182)
Saturation Ratios: 18 % (ref 17.9–39.5)
TIBC: 377 ug/dL (ref 250–450)
UIBC: 310 ug/dL

## 2021-03-31 LAB — FERRITIN: Ferritin: 127 ng/mL (ref 24–336)

## 2021-04-02 DIAGNOSIS — N189 Chronic kidney disease, unspecified: Secondary | ICD-10-CM | POA: Diagnosis not present

## 2021-04-02 DIAGNOSIS — D631 Anemia in chronic kidney disease: Secondary | ICD-10-CM | POA: Diagnosis not present

## 2021-04-02 DIAGNOSIS — I959 Hypotension, unspecified: Secondary | ICD-10-CM | POA: Diagnosis not present

## 2021-04-02 DIAGNOSIS — E1165 Type 2 diabetes mellitus with hyperglycemia: Secondary | ICD-10-CM | POA: Diagnosis not present

## 2021-04-02 DIAGNOSIS — I2510 Atherosclerotic heart disease of native coronary artery without angina pectoris: Secondary | ICD-10-CM | POA: Diagnosis not present

## 2021-04-02 DIAGNOSIS — M549 Dorsalgia, unspecified: Secondary | ICD-10-CM | POA: Diagnosis not present

## 2021-04-02 DIAGNOSIS — I5030 Unspecified diastolic (congestive) heart failure: Secondary | ICD-10-CM | POA: Diagnosis not present

## 2021-04-02 DIAGNOSIS — R748 Abnormal levels of other serum enzymes: Secondary | ICD-10-CM | POA: Diagnosis not present

## 2021-04-02 DIAGNOSIS — E1122 Type 2 diabetes mellitus with diabetic chronic kidney disease: Secondary | ICD-10-CM | POA: Diagnosis not present

## 2021-04-02 DIAGNOSIS — E875 Hyperkalemia: Secondary | ICD-10-CM | POA: Diagnosis not present

## 2021-04-02 DIAGNOSIS — I130 Hypertensive heart and chronic kidney disease with heart failure and stage 1 through stage 4 chronic kidney disease, or unspecified chronic kidney disease: Secondary | ICD-10-CM | POA: Diagnosis not present

## 2021-04-02 DIAGNOSIS — N179 Acute kidney failure, unspecified: Secondary | ICD-10-CM | POA: Diagnosis not present

## 2021-04-02 DIAGNOSIS — E871 Hypo-osmolality and hyponatremia: Secondary | ICD-10-CM | POA: Diagnosis not present

## 2021-04-06 DIAGNOSIS — N179 Acute kidney failure, unspecified: Secondary | ICD-10-CM | POA: Diagnosis not present

## 2021-04-06 DIAGNOSIS — M549 Dorsalgia, unspecified: Secondary | ICD-10-CM | POA: Diagnosis not present

## 2021-04-06 DIAGNOSIS — N189 Chronic kidney disease, unspecified: Secondary | ICD-10-CM | POA: Diagnosis not present

## 2021-04-06 DIAGNOSIS — E875 Hyperkalemia: Secondary | ICD-10-CM | POA: Diagnosis not present

## 2021-04-06 DIAGNOSIS — I2510 Atherosclerotic heart disease of native coronary artery without angina pectoris: Secondary | ICD-10-CM | POA: Diagnosis not present

## 2021-04-06 DIAGNOSIS — E871 Hypo-osmolality and hyponatremia: Secondary | ICD-10-CM | POA: Diagnosis not present

## 2021-04-06 DIAGNOSIS — R748 Abnormal levels of other serum enzymes: Secondary | ICD-10-CM | POA: Diagnosis not present

## 2021-04-06 DIAGNOSIS — H353111 Nonexudative age-related macular degeneration, right eye, early dry stage: Secondary | ICD-10-CM | POA: Diagnosis not present

## 2021-04-06 DIAGNOSIS — D631 Anemia in chronic kidney disease: Secondary | ICD-10-CM | POA: Diagnosis not present

## 2021-04-06 DIAGNOSIS — E1165 Type 2 diabetes mellitus with hyperglycemia: Secondary | ICD-10-CM | POA: Diagnosis not present

## 2021-04-06 DIAGNOSIS — I130 Hypertensive heart and chronic kidney disease with heart failure and stage 1 through stage 4 chronic kidney disease, or unspecified chronic kidney disease: Secondary | ICD-10-CM | POA: Diagnosis not present

## 2021-04-06 DIAGNOSIS — I959 Hypotension, unspecified: Secondary | ICD-10-CM | POA: Diagnosis not present

## 2021-04-06 DIAGNOSIS — I5030 Unspecified diastolic (congestive) heart failure: Secondary | ICD-10-CM | POA: Diagnosis not present

## 2021-04-06 DIAGNOSIS — E1122 Type 2 diabetes mellitus with diabetic chronic kidney disease: Secondary | ICD-10-CM | POA: Diagnosis not present

## 2021-04-07 ENCOUNTER — Inpatient Hospital Stay (HOSPITAL_BASED_OUTPATIENT_CLINIC_OR_DEPARTMENT_OTHER): Payer: Federal, State, Local not specified - PPO | Admitting: Hematology

## 2021-04-07 ENCOUNTER — Other Ambulatory Visit: Payer: Self-pay

## 2021-04-07 VITALS — BP 145/68 | HR 79 | Temp 96.9°F | Resp 20 | Wt 253.0 lb

## 2021-04-07 DIAGNOSIS — Z79899 Other long term (current) drug therapy: Secondary | ICD-10-CM | POA: Diagnosis not present

## 2021-04-07 DIAGNOSIS — D696 Thrombocytopenia, unspecified: Secondary | ICD-10-CM

## 2021-04-07 DIAGNOSIS — E2740 Unspecified adrenocortical insufficiency: Secondary | ICD-10-CM | POA: Diagnosis not present

## 2021-04-07 DIAGNOSIS — N183 Chronic kidney disease, stage 3 unspecified: Secondary | ICD-10-CM | POA: Diagnosis not present

## 2021-04-07 DIAGNOSIS — I2510 Atherosclerotic heart disease of native coronary artery without angina pectoris: Secondary | ICD-10-CM | POA: Diagnosis not present

## 2021-04-07 DIAGNOSIS — I129 Hypertensive chronic kidney disease with stage 1 through stage 4 chronic kidney disease, or unspecified chronic kidney disease: Secondary | ICD-10-CM | POA: Diagnosis not present

## 2021-04-07 DIAGNOSIS — E785 Hyperlipidemia, unspecified: Secondary | ICD-10-CM | POA: Diagnosis not present

## 2021-04-07 DIAGNOSIS — E119 Type 2 diabetes mellitus without complications: Secondary | ICD-10-CM | POA: Diagnosis not present

## 2021-04-07 DIAGNOSIS — Z809 Family history of malignant neoplasm, unspecified: Secondary | ICD-10-CM | POA: Diagnosis not present

## 2021-04-07 NOTE — Progress Notes (Signed)
[address] [place], [place] [address]   CLINIC:  Medical Oncology/Hematology  PCP:  [name], [place] / [place] [address]  [phone]  REASON FOR VISIT:  Follow-up for thrombocytopenia  PRIOR THERAPY: Intermittent Feraheme last on 03/21/2021  CURRENT THERAPY: Iron tablet daily  INTERVAL HISTORY:  [name], a 60 y.o. male, returns for routine follow-up for his thrombocytopenia. [name] was last seen on 12/09/2020.  Today he reports feeling okay. He denies having any nosebleeds, melena, hematemesis, hematochezia or hematuria. His energy levels have improved since getting his Feraheme on 04/09. He was hospitalized with adrenal insufficiency on 04/04 and has been taking midodrine 15 mg TID since getting out of [facility]. He is taking an iron tablet daily and takes lactulose for constipation.  He is scheduled to have an EGD on 06/02 at [facility].   REVIEW OF SYSTEMS:  Review of Systems  Constitutional: Positive for fatigue (50%). Negative for appetite change.  HENT:   Negative for nosebleeds.   Gastrointestinal: Positive for constipation (on lactulose). Negative for blood in stool.  Genitourinary: Negative for hematuria.   Musculoskeletal: Positive for arthralgias (3/10 knee pain).  All other systems reviewed and are negative.   PAST MEDICAL/SURGICAL HISTORY:  Past Medical History:  Diagnosis Date  . Anemia   . Arthritis   . CAD (coronary artery disease)    Multivessel disease status post CABG 08/2015  . Cirrhosis ([place])   . CKD (chronic kidney disease) stage 3, GFR 30-59 ml/min (HCC)   . Diastolic CHF ([place])   . Essential hypertension   . Hyperlipidemia   . OSA on CPAP   . Polyclonal gammopathy   . Thrombocytopenia ([place]) 2016  . Type 2 diabetes mellitus ([place])    Past Surgical History:  Procedure Laterality Date  . APPENDECTOMY    . Biceps tendon surgery Right   . BIOPSY  11/22/2019   Procedure: BIOPSY;   Surgeon: Daneil Dolin, MD;  [facility];  Service: Endoscopy;;  . CARDIAC CATHETERIZATION N/A 08/25/2015   Procedure: Left Heart Cath and Coronary Angiography;  Surgeon: Belva Crome, MD;  Location: [place] CV LAB;  Service: Cardiovascular;  Laterality: N/A;  . COLONOSCOPY WITH PROPOFOL N/A 11/22/2019   Procedure: COLONOSCOPY WITH PROPOFOL;  Surgeon: Daneil Dolin, MD; Four 4-5 mm polyps, findings suggestive of portal colopathy, congested appearing colonic mucosa diffusely, rectal varices, and adequate right colon prep.  Pathology with tubular adenomas and hyperplastic polyp.  Right colon biopsy with focal active colitis.  Recommendations to repeat colonoscopy in 3 months due to poor prep.  . COLONOSCOPY WITH PROPOFOL N/A 03/17/2020   Procedure: COLONOSCOPY WITH PROPOFOL;  Surgeon: Daneil Dolin, MD;  [facility];  Service: Endoscopy;  Laterality: N/A;  8:45am - pt does not need covid test, was + 2/4 <90 days  . CORONARY ARTERY BYPASS GRAFT N/A 08/29/2015   Procedure: CORONARY ARTERY BYPASS GRAFTING (CABG);  Surgeon: [name], MD;  Location: [facility];  Service: Open Heart Surgery;  Laterality: N/A;  . ESOPHAGOGASTRODUODENOSCOPY (EGD) WITH PROPOFOL N/A 11/22/2019   Procedure: ESOPHAGOGASTRODUODENOSCOPY (EGD) WITH PROPOFOL;  Surgeon: Daneil Dolin, MD; 4 columns of grade 2-3 esophageal varices, portal gastropathy, gastric polyp/abnormal gastric mucosa s/p biopsy.  Pathology with hyperplastic polyp, mild chronic gastritis, negative H. pylori.  [name] KNEE ARTHROSCOPY Left   . TEE WITHOUT CARDIOVERSION N/A 08/29/2015   Procedure: TRANSESOPHAGEAL ECHOCARDIOGRAM (TEE);  Surgeon: [name],  MD;  Location: [facility];  Service: Open Heart Surgery;  Laterality: N/A;  . TOTAL KNEE ARTHROPLASTY Left 03/09/2017   Procedure: TOTAL KNEE ARTHROPLASTY;  Surgeon: Carole Civil, MD;  Location: [facility];  Service: Orthopedics;  Laterality: Left;    SOCIAL HISTORY:  Social  History   Socioeconomic History  . Marital status: Married    Spouse name: Not on file  . Number of children: 0  . Years of education: college  . Highest education level: Not on file  Occupational History  . Occupation: Maintenance tech    Employer: [employer]  Tobacco Use  . Smoking status: Never Smoker  . Smokeless tobacco: Never Used  Vaping Use  . Vaping Use: Never used  Substance and Sexual Activity  . Alcohol use: No    Alcohol/week: 0.0 standard drinks  . Drug use: No  . Sexual activity: Not Currently  Other Topics Concern  . Not on file  Social History Narrative  . Not on file   Social Determinants of Health   Financial Resource Strain: Not on file  Food Insecurity: Not on file  Transportation Needs: Not on file  Physical Activity: Not on file  Stress: Not on file  Social Connections: Not on file  Intimate Partner Violence: Not on file    FAMILY HISTORY:  Family History  Problem Relation Age of Onset  . Arthritis Other   . Cancer Other   . Diabetes Other   . CAD Father   . Diabetes Mellitus II Father   . Liver cancer Father   . Hodgkin's lymphoma Father   . CAD Brother   . Diabetes Mellitus II Brother   . ALS Mother   . Diabetes Mellitus II Sister   . Diabetes Mellitus II Brother   . Diabetes Mellitus II Maternal Grandmother   . Aneurysm Maternal Grandmother   . Cancer Maternal Grandfather   . Anesthesia problems Neg Hx   . Hypotension Neg Hx   . Malignant hyperthermia Neg Hx   . Pseudochol deficiency Neg Hx   . Colon cancer Neg Hx     CURRENT MEDICATIONS:  Current Outpatient Medications  Medication Sig Dispense Refill  . acetaminophen (TYLENOL) 325 MG tablet Take 650 mg by mouth every 6 (six) hours as needed for mild pain.    [name] albuterol (VENTOLIN HFA) 108 (90 Base) MCG/ACT inhaler Inhale 2 puffs into the lungs every 6 (six) hours as needed for wheezing or shortness of breath. 18 g 0  . ALPRAZolam (XANAX) 0.25 MG tablet Take 0.25 mg by  mouth daily as needed for anxiety.    . ALPRAZolam (XANAX) 0.5 MG tablet Take 1 tablet (0.5 mg total) by mouth daily as needed. 10 tablet 0  . Armodafinil 200 MG TABS Take 200 mg by mouth daily as needed (for sleepiness). 30 tablet 0  . aspirin EC 81 MG tablet Take 81 mg by mouth at bedtime.     . budesonide (PULMICORT) 180 MCG/ACT inhaler Inhale 1 puff into the lungs daily.    . Cholecalciferol (DIALYVITE VITAMIN D 5000) 125 MCG (5000 UT) capsule Take 5,000 Units by mouth in the morning.     . Continuous Blood Gluc Receiver (FREESTYLE LIBRE 14 DAY READER) DEVI 1 reader 10 each 0  . Continuous Blood Gluc Sensor (FREESTYLE LIBRE 14 DAY SENSOR) MISC 1 sensor    . Elastic Bandages & Supports (MEDICAL COMPRESSION STOCKINGS) MISC 1 each by Does not apply route as directed. Low pressure knee high  compression stockings Dx: leg swelling 1 each 0  . ferrous sulfate 325 (65 FE) MG tablet Take 325 mg by mouth in the morning.     . Flaxseed, Linseed, (FLAXSEED OIL) 1000 MG CAPS Take 1,000 mg by mouth at bedtime.    . hydrochlorothiazide (HYDRODIURIL) 25 MG tablet Take 0.5 tablets by mouth daily.    . hydrocortisone (CORTEF) 20 MG tablet Take 1 tablet (20 mg total) by mouth 2 (two) times daily. 60 tablet 3  . INS SYRINGE/NEEDLE .5CC/28G 28G X 1/2" 0.5 ML MISC Inject 1 Syringe into the skin See admin instructions. 100 each 0  . insulin aspart (NOVOLOG) 100 UNIT/ML injection Inject 5 Units into the skin 3 (three) times daily before meals. Special Instructions: If accu-check is greater than 150. Hold for accu-check 150 or below. With Meals 10 mL 0  . insulin degludec (TRESIBA FLEXTOUCH) 200 UNIT/ML FlexTouch Pen Inject 40 Units into the skin in the morning. Give 30 units Subcutaneous  at Bedtime 8:00 pm 15 mL 0  . lactulose (CHRONULAC) 10 GM/15ML solution Take 30 mLs (20 g total) by mouth daily. 900 mL 0  . lidocaine (LIDODERM) 5 % Place 1 patch onto the skin daily. Remove & Discard patch within 12 hours or as  directed by MD 30 patch 0  . loratadine (CLARITIN) 10 MG tablet Take 10 mg by mouth in the morning.     . Magnesium 250 MG TABS Take 250 mg by mouth daily.    . midodrine (PROAMATINE) 5 MG tablet Take 3 tablets (15 mg total) by mouth 3 (three) times daily with meals. 270 tablet 2  . Misc Natural Products (ELDERBERRY ZINC/VIT C/IMMUNE) LOZG Take 1 lozenge by mouth at bedtime.    . NON FORMULARY Diet: _____ Regular, __x____ NAS, __x_____Consistent Carbohydrate, _______NPO _____Other    . olmesartan (BENICAR) 40 MG tablet Take 40 mg by mouth daily.    [name] omeprazole (PRILOSEC) 40 MG capsule Take 1 capsule (40 mg total) by mouth at bedtime. 30 capsule 0  . OZEMPIC, 1 MG/DOSE, 4 MG/3ML SOPN Inject 1 mg into the skin once a week. 3 mL 0  . potassium chloride SA (KLOR-CON) 20 MEQ tablet Take 20 mEq by mouth 2 (two) times daily.    . pravastatin (PRAVACHOL) 20 MG tablet TAKE 1 TABLET(20 MG) BY MOUTH AT BEDTIME (Patient taking differently: Take 20 mg by mouth daily.) 30 tablet 0  . torsemide (DEMADEX) 20 MG tablet Take 40 mg by mouth 2 (two) times daily.    . TURMERIC CURCUMIN PO Take 2,000 mg by mouth at bedtime.     [name] venlafaxine XR (EFFEXOR-XR) 37.5 MG 24 hr capsule Take 37.5 mg by mouth daily.     No current facility-administered medications for this visit.    ALLERGIES:  Allergies  Allergen Reactions  . Fluticasone Rash    PHYSICAL EXAM:  Performance status (ECOG): 1 - Symptomatic but completely ambulatory  Vitals:   04/07/21 1531  BP: (!) 145/68  Pulse: 79  Resp: 20  Temp: (!) 96.9 F (36.1 C)  SpO2: 100%   Wt Readings from Last 3 Encounters:  04/07/21 253 lb (114.8 kg)  03/24/21 286 lb 2.5 oz (129.8 kg)  02/27/21 264 lb (119.7 kg)   Physical Exam Vitals reviewed.  Constitutional:      Appearance: Normal appearance. He is obese.  Cardiovascular:     Rate and Rhythm: Normal rate and regular rhythm.     Pulses: Normal pulses.  Heart sounds: Normal heart sounds.   Pulmonary:     Effort: Pulmonary effort is normal.     Breath sounds: Normal breath sounds.  Neurological:     General: No focal deficit present.     Mental Status: He is alert and oriented to person, place, and time.  Psychiatric:        Mood and Affect: Mood normal.        Behavior: Behavior normal.     LABORATORY DATA:  I have reviewed the labs as listed.  CBC Latest Ref Rng & Units 03/31/2021 03/23/2021 03/22/2021  WBC 4.0 - 10.5 K/uL 7.2 5.8 4.4  Hemoglobin 13.0 - 17.0 g/dL 11.0(L) 8.3(L) 7.9(L)  Hematocrit 39.0 - 52.0 % 34.3(L) 25.7(L) 23.7(L)  Platelets 150 - 400 K/uL 75(L) 59(L) 51(L)   CMP Latest Ref Rng & Units 03/23/2021 03/22/2021 03/21/2021  Glucose 70 - 99 mg/dL 104(H) 140(H) 441(H)  BUN 8 - 23 mg/dL 37(H) 37(H) -  Creatinine 0.61 - 1.24 mg/dL 1.52(H) 1.40(H) -  Sodium 135 - 145 mmol/L 137 135 -  Potassium 3.5 - 5.1 mmol/L 4.0 4.4 -  Chloride 98 - 111 mmol/L 107 106 -  CO2 22 - 32 mmol/L 21(L) 21(L) -  Calcium 8.9 - 10.3 mg/dL 9.0 9.1 -  Total Protein 6.5 - 8.1 g/dL - - -  Total Bilirubin 0.3 - 1.2 mg/dL - - -  Alkaline Phos 38 - 126 U/L - - -  AST 15 - 41 U/L - - -  ALT 0 - 44 U/L - - -      Component Value Date/Time   RBC 3.26 (L) 03/31/2021 1359   MCV 105.2 (H) 03/31/2021 1359   MCV 84 12/15/2018 1525   MCH 33.7 03/31/2021 1359   MCHC 32.1 03/31/2021 1359   RDW 17.1 (H) 03/31/2021 1359   RDW 15.1 12/15/2018 1525   LYMPHSABS 0.7 03/31/2021 1359   LYMPHSABS 1.5 12/15/2018 1525   MONOABS 0.6 03/31/2021 1359   EOSABS 0.1 03/31/2021 1359   EOSABS 0.1 12/15/2018 1525   BASOSABS 0.0 03/31/2021 1359   BASOSABS 0.0 12/15/2018 1525   Lab Results  Component Value Date   TIBC 377 03/31/2021   TIBC 331 03/21/2021   TIBC 368 12/01/2020   FERRITIN 127 03/31/2021   FERRITIN 86 03/21/2021   FERRITIN 301 12/01/2020   IRONPCTSAT 18 03/31/2021   IRONPCTSAT 10 (L) 03/21/2021   IRONPCTSAT 35 12/01/2020    DIAGNOSTIC IMAGING:  I have independently reviewed the  scans and discussed with the patient. CT CERVICAL SPINE WO CONTRAST  Result Date: 03/17/2021 CLINICAL DATA:  Neck pain EXAM: CT CERVICAL SPINE WITHOUT CONTRAST TECHNIQUE: Multidetector CT imaging of the cervical spine was performed without intravenous contrast. Multiplanar CT image reconstructions were also generated. COMPARISON:  None. FINDINGS: Alignment: No subluxation. Skull base and vertebrae: No acute fracture. No primary bone lesion or focal pathologic process. Soft tissues and spinal canal: No prevertebral fluid or swelling. No visible canal hematoma. Disc levels:  Disc spaces maintained. Upper chest: Negative Other: None IMPRESSION: No acute bony abnormality. Electronically Signed   By: Rolm Baptise M.D.   On: 03/17/2021 01:05   CT L-SPINE NO CHARGE  Result Date: 03/17/2021 CLINICAL DATA:  Initial evaluation for acute severe lower back pain. EXAM: CT LUMBAR SPINE WITHOUT CONTRAST TECHNIQUE: Multidetector CT imaging of the lumbar spine was performed without intravenous contrast administration. Multiplanar CT image reconstructions were also generated. COMPARISON:  None available. FINDINGS: Segmentation: Standard. Lowest well-formed disc space  labeled the L5-S1 level. Alignment: Trace anterolisthesis of L4 on L5, chronic and facet mediated. Alignment otherwise normal preservation of the normal lumbar lordosis. Vertebrae: Vertebral body height maintained without evidence for acute or chronic fracture. Visualized sacrum and pelvis intact. SI joints approximated symmetric. No discrete or worrisome osseous lesions. Paraspinal and other soft tissues: Paraspinous soft tissues demonstrate no acute finding. Disc levels: L1-2:  Unremarkable. L2-3: Mild disc bulge, slightly eccentric to the right. No spinal stenosis. Foramina remain patent. L3-4: Mild diffuse disc bulge with annular calcification. Superimposed right foraminal to extraforaminal disc protrusion closely approximates and potentially affects the exiting  right L3 nerve root (series 7, image 66). Superimposed mild facet hypertrophy. No more than mild spinal stenosis. Mild bilateral L3 foraminal narrowing. L4-5: Trace anterolisthesis. Diffuse disc bulge, slightly asymmetric to the right. Mild to moderate facet and ligament flavum hypertrophy. Resultant moderate canal with bilateral subarticular stenosis. Moderate right worse than left L4 foraminal narrowing. L5-S1: Mild disc bulge. Superimposed left foraminal disc protrusion closely approximates and could potentially affect the exiting left L5 nerve root (series 7, image 97). Mild to moderate bilateral facet hypertrophy. No significant canal or lateral recess stenosis. Mild right with moderate left L5 foraminal narrowing. IMPRESSION: 1. No acute osseous abnormality within the lumbar spine. 2. Trace anterolisthesis of L4 on L5 with associated disc bulge and facet hypertrophy, resulting in moderate canal with bilateral subarticular stenosis. 3. Right foraminal to extraforaminal disc protrusion at L3-4, closely approximating and potentially affecting the exiting right L3 nerve root. 4. Left foraminal disc protrusion at L5-S1, potentially affecting the exiting left L5 nerve root. 5. Mild-to-moderate lower lumbar facet arthropathy. Finding could contribute to lower back pain. Electronically Signed   By: Jeannine Boga M.D.   On: 03/17/2021 01:13   DG CHEST PORT 1 VIEW  Result Date: 03/17/2021 CLINICAL DATA:  Central line placement EXAM: PORTABLE CHEST 1 VIEW COMPARISON:  12/09/2020 FINDINGS: Right IJ venous catheter terminates at the cavoatrial junction. Pulmonary vascular congestion without frank interstitial edema. No pleural effusion or pneumothorax. The heart is normal in size. Postsurgical changes related to prior CABG. Median sternotomy. IMPRESSION: Right IJ venous catheter terminates at the cavoatrial junction. No pneumothorax. Electronically Signed   By: Julian Hy M.D.   On: 03/17/2021 16:10   CT  Renal Stone Study  Result Date: 03/17/2021 CLINICAL DATA:  Severe low back pain and neck pain.  No injury. EXAM: CT ABDOMEN AND PELVIS WITHOUT CONTRAST TECHNIQUE: Multidetector CT imaging of the abdomen and pelvis was performed following the standard protocol without IV contrast. COMPARISON:  05/16/2016 FINDINGS: Lower chest: Previous sternotomy.  Lung bases are clear. Hepatobiliary: Probable hepatic cirrhosis with nodular liver contour and enlarged lateral segment left lobe. No focal lesions are identified on noncontrast imaging. Cholelithiasis. No bile duct dilatation. Pancreas: Edema and stranding in the peripancreatic fat. No loculated collections. No pancreatic ductal dilatation. Spleen: Normal in size without focal abnormality. Adrenals/Urinary Tract: Adrenal glands are unremarkable. Kidneys are normal, without renal calculi, focal lesion, or hydronephrosis. Bladder is unremarkable. Stomach/Bowel: Stomach, small bowel, and colon are not abnormally distended. No wall thickening or inflammatory changes appreciated. Appendix is not identified. Vascular/Lymphatic: Aortic atherosclerosis. No enlarged abdominal or pelvic lymph nodes. Reproductive: Prostate is unremarkable. Other: No free air or free fluid in the abdomen. Abdominal wall musculature appears intact. Musculoskeletal: Normal alignment of the lumbar spine. Degenerative changes most prominent in the lower thoracic spine. No destructive bone lesions. IMPRESSION: 1. Edema and stranding in the peripancreatic fat consistent  with acute pancreatitis. No loculated collections. 2. Probable hepatic cirrhosis. 3. Cholelithiasis without evidence of cholecystitis. 4. Aortic atherosclerosis. Aortic Atherosclerosis (ICD10-I70.0). Electronically Signed   By: Lucienne Capers M.D.   On: 03/17/2021 00:49     ASSESSMENT:  1. Moderate thrombocytopenia: -Thrombocytopenia since 2016. Platelets between 55-90 since 2018. -BMBX on 01/26/2019 showed slightly hypercellular  marrow for age with trilineage hematopoiesis. No significant dyspoiesis or increase in blasts. Plasma cells are slightly increased in number but with polyclonal staining pattern. Abundant megakaryocytes with scattered small hypolobulated forms. Chromosome analysis was normal. Biopsy was done for work-up of increased IgA levels. -Ultrasound abdomen on 01/15/2020 showed nodular contour of the liver consistent with cirrhosis. Splenomegaly with volume of 1562 mL. -Thrombocytopenia secondary to splenomegaly from cirrhosis.  2. Normocytic to macrocytic anemia: -CBC on 03/26/2020 shows hemoglobin 11.8. MCV is 100. -He does have CKD. Ferritin was 63 and percent saturation was 22. -He is feeling very tired. This is a combination anemia from CKD and iron deficiency. -Colonoscopy on 03/17/2020 showed inadequate preparation of the colon with rectal varices. Much of the colon could not be seen. -Last Feraheme infusion was in May 2021.  3. Positive rheumatoid factor: -Work-up for thrombocytopenia showed rheumatoid factor positive at 58.6. ANA was negative. -He does not have any clinical signs or symptoms of rheumatoid arthritis.   PLAN:  1. Moderate thrombocytopenia: -This is thought to be secondary to splenomegaly. - Latest platelet count is stable around 70 5K.  No bleeding issues.  We will continue to monitor.  2. Normocytic to macrocytic anemia: -He received Feraheme on 03/21/2021 during recent hospitalization. - Ferritin improved to 127 on 03/31/2021.  Hemoglobin was 11. - Continue iron tablet daily. - RTC 3 months with repeat CBC, ferritin and iron panel.  3.   Adrenal insufficiency: -He was recently hospitalized in the first week of April with adrenal insufficiency. - His blood pressure is well maintained at this time. - Continue hydrocortisone 20 mg twice daily and midodrine 15 mg 3 times daily.  Orders placed this encounter:  Orders Placed This Encounter  Procedures  . CBC  with Differential/Platelet  . Ferritin  . Iron and TIBC     Derek Jack, MD [place] [phone]   I, Milinda Antis, am acting as a scribe for Dr. Sanda Linger.  I, Derek Jack MD, have reviewed the above documentation for accuracy and completeness, and I agree with the above.

## 2021-04-07 NOTE — Patient Instructions (Signed)
[place] at [facility] Discharge Instructions  You were seen today by Dr. Delton Coombes. He went over your recent results. If your energy levels start to decrease, call the office to have your blood checked for additional iron infusions. Dr. Delton Coombes will see you back in 3 months for labs and follow up.   Thank you for choosing [place] at [facility] to provide your oncology and hematology care.  To afford each patient quality time with our provider, please arrive at least 15 minutes before your scheduled appointment time.   If you have a lab appointment with the [place] please come in thru the Main Entrance and check in at the main information desk  You need to re-schedule your appointment should you arrive 10 or more minutes late.  We strive to give you quality time with our providers, and arriving late affects you and other patients whose appointments are after yours.  Also, if you no show three or more times for appointments you may be dismissed from the clinic at the providers discretion.     Again, thank you for choosing [facility].  Our hope is that these requests will decrease the amount of time that you wait before being seen by our physicians.       _____________________________________________________________  Should you have questions after your visit to [facility], please contact our office at [phone] between the hours of 8:00 a.m. and 4:30 p.m.  Voicemails left after 4:00 p.m. will not be returned until the following business day.  For prescription refill requests, have your pharmacy contact our office and allow 72 hours.    Cancer Center Support Programs:   > Cancer Support Group  2nd Tuesday of the month 1pm-2pm, Journey Room

## 2021-04-08 DIAGNOSIS — R748 Abnormal levels of other serum enzymes: Secondary | ICD-10-CM | POA: Diagnosis not present

## 2021-04-08 DIAGNOSIS — N179 Acute kidney failure, unspecified: Secondary | ICD-10-CM | POA: Diagnosis not present

## 2021-04-08 DIAGNOSIS — N189 Chronic kidney disease, unspecified: Secondary | ICD-10-CM | POA: Diagnosis not present

## 2021-04-08 DIAGNOSIS — I130 Hypertensive heart and chronic kidney disease with heart failure and stage 1 through stage 4 chronic kidney disease, or unspecified chronic kidney disease: Secondary | ICD-10-CM | POA: Diagnosis not present

## 2021-04-08 DIAGNOSIS — M549 Dorsalgia, unspecified: Secondary | ICD-10-CM | POA: Diagnosis not present

## 2021-04-08 DIAGNOSIS — E871 Hypo-osmolality and hyponatremia: Secondary | ICD-10-CM | POA: Diagnosis not present

## 2021-04-08 DIAGNOSIS — I5030 Unspecified diastolic (congestive) heart failure: Secondary | ICD-10-CM | POA: Diagnosis not present

## 2021-04-08 DIAGNOSIS — E1165 Type 2 diabetes mellitus with hyperglycemia: Secondary | ICD-10-CM | POA: Diagnosis not present

## 2021-04-08 DIAGNOSIS — E1122 Type 2 diabetes mellitus with diabetic chronic kidney disease: Secondary | ICD-10-CM | POA: Diagnosis not present

## 2021-04-08 DIAGNOSIS — I959 Hypotension, unspecified: Secondary | ICD-10-CM | POA: Diagnosis not present

## 2021-04-08 DIAGNOSIS — E875 Hyperkalemia: Secondary | ICD-10-CM | POA: Diagnosis not present

## 2021-04-08 DIAGNOSIS — D631 Anemia in chronic kidney disease: Secondary | ICD-10-CM | POA: Diagnosis not present

## 2021-04-08 DIAGNOSIS — I2510 Atherosclerotic heart disease of native coronary artery without angina pectoris: Secondary | ICD-10-CM | POA: Diagnosis not present

## 2021-04-09 ENCOUNTER — Ambulatory Visit: Payer: Federal, State, Local not specified - PPO | Admitting: Gastroenterology

## 2021-04-10 DIAGNOSIS — I129 Hypertensive chronic kidney disease with stage 1 through stage 4 chronic kidney disease, or unspecified chronic kidney disease: Secondary | ICD-10-CM | POA: Diagnosis not present

## 2021-04-10 DIAGNOSIS — E1122 Type 2 diabetes mellitus with diabetic chronic kidney disease: Secondary | ICD-10-CM | POA: Diagnosis not present

## 2021-04-10 DIAGNOSIS — I130 Hypertensive heart and chronic kidney disease with heart failure and stage 1 through stage 4 chronic kidney disease, or unspecified chronic kidney disease: Secondary | ICD-10-CM | POA: Diagnosis not present

## 2021-04-10 DIAGNOSIS — I2510 Atherosclerotic heart disease of native coronary artery without angina pectoris: Secondary | ICD-10-CM | POA: Diagnosis not present

## 2021-04-10 DIAGNOSIS — D631 Anemia in chronic kidney disease: Secondary | ICD-10-CM | POA: Diagnosis not present

## 2021-04-10 DIAGNOSIS — E2740 Unspecified adrenocortical insufficiency: Secondary | ICD-10-CM | POA: Diagnosis not present

## 2021-04-10 DIAGNOSIS — N179 Acute kidney failure, unspecified: Secondary | ICD-10-CM | POA: Diagnosis not present

## 2021-04-10 DIAGNOSIS — E872 Acidosis: Secondary | ICD-10-CM | POA: Diagnosis not present

## 2021-04-10 DIAGNOSIS — N189 Chronic kidney disease, unspecified: Secondary | ICD-10-CM | POA: Diagnosis not present

## 2021-04-10 DIAGNOSIS — M549 Dorsalgia, unspecified: Secondary | ICD-10-CM | POA: Diagnosis not present

## 2021-04-10 DIAGNOSIS — E871 Hypo-osmolality and hyponatremia: Secondary | ICD-10-CM | POA: Diagnosis not present

## 2021-04-10 DIAGNOSIS — I5030 Unspecified diastolic (congestive) heart failure: Secondary | ICD-10-CM | POA: Diagnosis not present

## 2021-04-10 DIAGNOSIS — R748 Abnormal levels of other serum enzymes: Secondary | ICD-10-CM | POA: Diagnosis not present

## 2021-04-10 DIAGNOSIS — I959 Hypotension, unspecified: Secondary | ICD-10-CM | POA: Diagnosis not present

## 2021-04-10 DIAGNOSIS — E1165 Type 2 diabetes mellitus with hyperglycemia: Secondary | ICD-10-CM | POA: Diagnosis not present

## 2021-04-10 DIAGNOSIS — E875 Hyperkalemia: Secondary | ICD-10-CM | POA: Diagnosis not present

## 2021-04-14 ENCOUNTER — Telehealth: Payer: Self-pay

## 2021-04-14 DIAGNOSIS — N189 Chronic kidney disease, unspecified: Secondary | ICD-10-CM | POA: Diagnosis not present

## 2021-04-14 DIAGNOSIS — R748 Abnormal levels of other serum enzymes: Secondary | ICD-10-CM | POA: Diagnosis not present

## 2021-04-14 DIAGNOSIS — Z01818 Encounter for other preprocedural examination: Secondary | ICD-10-CM | POA: Diagnosis not present

## 2021-04-14 DIAGNOSIS — E0822 Diabetes mellitus due to underlying condition with diabetic chronic kidney disease: Secondary | ICD-10-CM | POA: Diagnosis not present

## 2021-04-14 DIAGNOSIS — E875 Hyperkalemia: Secondary | ICD-10-CM | POA: Diagnosis not present

## 2021-04-14 DIAGNOSIS — D631 Anemia in chronic kidney disease: Secondary | ICD-10-CM | POA: Diagnosis not present

## 2021-04-14 DIAGNOSIS — N179 Acute kidney failure, unspecified: Secondary | ICD-10-CM | POA: Diagnosis not present

## 2021-04-14 DIAGNOSIS — I130 Hypertensive heart and chronic kidney disease with heart failure and stage 1 through stage 4 chronic kidney disease, or unspecified chronic kidney disease: Secondary | ICD-10-CM | POA: Diagnosis not present

## 2021-04-14 DIAGNOSIS — I2510 Atherosclerotic heart disease of native coronary artery without angina pectoris: Secondary | ICD-10-CM | POA: Diagnosis not present

## 2021-04-14 DIAGNOSIS — I5030 Unspecified diastolic (congestive) heart failure: Secondary | ICD-10-CM | POA: Diagnosis not present

## 2021-04-14 DIAGNOSIS — E871 Hypo-osmolality and hyponatremia: Secondary | ICD-10-CM | POA: Diagnosis not present

## 2021-04-14 DIAGNOSIS — E1165 Type 2 diabetes mellitus with hyperglycemia: Secondary | ICD-10-CM | POA: Diagnosis not present

## 2021-04-14 DIAGNOSIS — K769 Liver disease, unspecified: Secondary | ICD-10-CM | POA: Diagnosis not present

## 2021-04-14 DIAGNOSIS — M549 Dorsalgia, unspecified: Secondary | ICD-10-CM | POA: Diagnosis not present

## 2021-04-14 DIAGNOSIS — I959 Hypotension, unspecified: Secondary | ICD-10-CM | POA: Diagnosis not present

## 2021-04-14 DIAGNOSIS — E1122 Type 2 diabetes mellitus with diabetic chronic kidney disease: Secondary | ICD-10-CM | POA: Diagnosis not present

## 2021-04-14 NOTE — Telephone Encounter (Signed)
Reviewed routine cirrhosis labs completed with [facility] in March 2022.  Overall, labs are stable.  MELD 17 which is actually improved from the time of our last visit.  He will continue to follow closely with [facility] and [facility].  We have follow-up with him in June 2022.

## 2021-04-14 NOTE — Telephone Encounter (Signed)
Aliene Altes, PA. I was asked to not mail lab orders for cbc, CMP, INR, AFP to pt since he might be having them completed with [facility]. Pt completed these labs and they can be seen in care everywhere 02/2021.

## 2021-04-15 ENCOUNTER — Ambulatory Visit: Payer: Federal, State, Local not specified - PPO | Attending: Internal Medicine

## 2021-04-15 DIAGNOSIS — Z23 Encounter for immunization: Secondary | ICD-10-CM

## 2021-04-15 NOTE — Telephone Encounter (Signed)
Spoke with pt. Pt was notified that results were reviewed and received. Pt will continue to follow [facility] and transplant. Pt will f/u with our office in 05/2021.

## 2021-04-15 NOTE — Progress Notes (Signed)
   Covid-19 Vaccination Clinic  Name:  [name]    MRN: [id number] DOB: February 14, 1961  04/15/2021  Mr. [name] was observed post Covid-19 immunization for 15 minutes without incident. He was provided with Vaccine Information Sheet and instruction to access the V-Safe system.   Mr. [name] was instructed to call 911 with any severe reactions post vaccine: [name] Difficulty breathing  . Swelling of face and throat  . A fast heartbeat  . A bad rash all over body  . Dizziness and weakness   Immunizations Administered    Name Date Dose VIS Date Route   PFIZER Comrnaty(Gray TOP) Covid-19 Vaccine 04/15/2021 10:16 AM 0.3 mL 11/20/2020 Intramuscular   Manufacturer: [employer], [employer]   Lot: [id number]   NDC: [phone]

## 2021-04-16 ENCOUNTER — Other Ambulatory Visit (HOSPITAL_COMMUNITY): Payer: Self-pay

## 2021-04-16 MED ORDER — COVID-19 MRNA VAC-TRIS(PFIZER) 30 MCG/0.3ML IM SUSP
INTRAMUSCULAR | 0 refills | Status: DC
  Filled 2021-04-16: qty 0.3, 17d supply, fill #0

## 2021-04-17 DIAGNOSIS — I959 Hypotension, unspecified: Secondary | ICD-10-CM | POA: Diagnosis not present

## 2021-04-17 DIAGNOSIS — E1122 Type 2 diabetes mellitus with diabetic chronic kidney disease: Secondary | ICD-10-CM | POA: Diagnosis not present

## 2021-04-17 DIAGNOSIS — M549 Dorsalgia, unspecified: Secondary | ICD-10-CM | POA: Diagnosis not present

## 2021-04-17 DIAGNOSIS — I5030 Unspecified diastolic (congestive) heart failure: Secondary | ICD-10-CM | POA: Diagnosis not present

## 2021-04-17 DIAGNOSIS — I130 Hypertensive heart and chronic kidney disease with heart failure and stage 1 through stage 4 chronic kidney disease, or unspecified chronic kidney disease: Secondary | ICD-10-CM | POA: Diagnosis not present

## 2021-04-17 DIAGNOSIS — D631 Anemia in chronic kidney disease: Secondary | ICD-10-CM | POA: Diagnosis not present

## 2021-04-17 DIAGNOSIS — I2510 Atherosclerotic heart disease of native coronary artery without angina pectoris: Secondary | ICD-10-CM | POA: Diagnosis not present

## 2021-04-17 DIAGNOSIS — E871 Hypo-osmolality and hyponatremia: Secondary | ICD-10-CM | POA: Diagnosis not present

## 2021-04-17 DIAGNOSIS — F419 Anxiety disorder, unspecified: Secondary | ICD-10-CM | POA: Diagnosis not present

## 2021-04-17 DIAGNOSIS — N179 Acute kidney failure, unspecified: Secondary | ICD-10-CM | POA: Diagnosis not present

## 2021-04-17 DIAGNOSIS — R748 Abnormal levels of other serum enzymes: Secondary | ICD-10-CM | POA: Diagnosis not present

## 2021-04-17 DIAGNOSIS — N189 Chronic kidney disease, unspecified: Secondary | ICD-10-CM | POA: Diagnosis not present

## 2021-04-17 DIAGNOSIS — F32A Depression, unspecified: Secondary | ICD-10-CM | POA: Diagnosis not present

## 2021-04-17 DIAGNOSIS — Z7682 Awaiting organ transplant status: Secondary | ICD-10-CM | POA: Diagnosis not present

## 2021-04-17 DIAGNOSIS — E1165 Type 2 diabetes mellitus with hyperglycemia: Secondary | ICD-10-CM | POA: Diagnosis not present

## 2021-04-17 DIAGNOSIS — E875 Hyperkalemia: Secondary | ICD-10-CM | POA: Diagnosis not present

## 2021-04-23 DIAGNOSIS — Z681 Body mass index (BMI) 19 or less, adult: Secondary | ICD-10-CM | POA: Diagnosis not present

## 2021-04-23 DIAGNOSIS — J22 Unspecified acute lower respiratory infection: Secondary | ICD-10-CM | POA: Diagnosis not present

## 2021-05-07 DIAGNOSIS — I739 Peripheral vascular disease, unspecified: Secondary | ICD-10-CM | POA: Diagnosis not present

## 2021-05-07 DIAGNOSIS — E1151 Type 2 diabetes mellitus with diabetic peripheral angiopathy without gangrene: Secondary | ICD-10-CM | POA: Diagnosis not present

## 2021-05-07 DIAGNOSIS — L110 Acquired keratosis follicularis: Secondary | ICD-10-CM | POA: Diagnosis not present

## 2021-05-07 DIAGNOSIS — M79671 Pain in right foot: Secondary | ICD-10-CM | POA: Diagnosis not present

## 2021-05-14 DIAGNOSIS — K3189 Other diseases of stomach and duodenum: Secondary | ICD-10-CM | POA: Diagnosis not present

## 2021-05-14 DIAGNOSIS — K219 Gastro-esophageal reflux disease without esophagitis: Secondary | ICD-10-CM | POA: Diagnosis not present

## 2021-05-14 DIAGNOSIS — Z6835 Body mass index (BMI) 35.0-35.9, adult: Secondary | ICD-10-CM | POA: Diagnosis not present

## 2021-05-14 DIAGNOSIS — Z79899 Other long term (current) drug therapy: Secondary | ICD-10-CM | POA: Diagnosis not present

## 2021-05-14 DIAGNOSIS — E1122 Type 2 diabetes mellitus with diabetic chronic kidney disease: Secondary | ICD-10-CM | POA: Diagnosis not present

## 2021-05-14 DIAGNOSIS — K766 Portal hypertension: Secondary | ICD-10-CM | POA: Diagnosis not present

## 2021-05-14 DIAGNOSIS — D631 Anemia in chronic kidney disease: Secondary | ICD-10-CM | POA: Diagnosis not present

## 2021-05-14 DIAGNOSIS — K7460 Unspecified cirrhosis of liver: Secondary | ICD-10-CM | POA: Diagnosis not present

## 2021-05-14 DIAGNOSIS — I129 Hypertensive chronic kidney disease with stage 1 through stage 4 chronic kidney disease, or unspecified chronic kidney disease: Secondary | ICD-10-CM | POA: Diagnosis not present

## 2021-05-14 DIAGNOSIS — K31819 Angiodysplasia of stomach and duodenum without bleeding: Secondary | ICD-10-CM | POA: Diagnosis not present

## 2021-05-14 DIAGNOSIS — Z7982 Long term (current) use of aspirin: Secondary | ICD-10-CM | POA: Diagnosis not present

## 2021-05-14 DIAGNOSIS — G4730 Sleep apnea, unspecified: Secondary | ICD-10-CM | POA: Diagnosis not present

## 2021-05-14 DIAGNOSIS — I8500 Esophageal varices without bleeding: Secondary | ICD-10-CM | POA: Diagnosis not present

## 2021-05-14 DIAGNOSIS — J45909 Unspecified asthma, uncomplicated: Secondary | ICD-10-CM | POA: Diagnosis not present

## 2021-05-14 DIAGNOSIS — Z794 Long term (current) use of insulin: Secondary | ICD-10-CM | POA: Diagnosis not present

## 2021-05-14 DIAGNOSIS — N189 Chronic kidney disease, unspecified: Secondary | ICD-10-CM | POA: Diagnosis not present

## 2021-05-14 DIAGNOSIS — E669 Obesity, unspecified: Secondary | ICD-10-CM | POA: Diagnosis not present

## 2021-05-14 DIAGNOSIS — I8510 Secondary esophageal varices without bleeding: Secondary | ICD-10-CM | POA: Diagnosis not present

## 2021-06-09 ENCOUNTER — Encounter: Payer: Self-pay | Admitting: Internal Medicine

## 2021-06-09 ENCOUNTER — Ambulatory Visit: Payer: Federal, State, Local not specified - PPO | Admitting: Internal Medicine

## 2021-06-10 DIAGNOSIS — K7460 Unspecified cirrhosis of liver: Secondary | ICD-10-CM | POA: Diagnosis not present

## 2021-06-10 DIAGNOSIS — Z01818 Encounter for other preprocedural examination: Secondary | ICD-10-CM | POA: Diagnosis not present

## 2021-06-18 DIAGNOSIS — Z951 Presence of aortocoronary bypass graft: Secondary | ICD-10-CM | POA: Diagnosis not present

## 2021-06-18 DIAGNOSIS — K7460 Unspecified cirrhosis of liver: Secondary | ICD-10-CM | POA: Diagnosis not present

## 2021-06-18 DIAGNOSIS — I8500 Esophageal varices without bleeding: Secondary | ICD-10-CM | POA: Diagnosis not present

## 2021-06-18 DIAGNOSIS — E1122 Type 2 diabetes mellitus with diabetic chronic kidney disease: Secondary | ICD-10-CM | POA: Diagnosis not present

## 2021-06-18 DIAGNOSIS — Z7982 Long term (current) use of aspirin: Secondary | ICD-10-CM | POA: Diagnosis not present

## 2021-06-18 DIAGNOSIS — I129 Hypertensive chronic kidney disease with stage 1 through stage 4 chronic kidney disease, or unspecified chronic kidney disease: Secondary | ICD-10-CM | POA: Diagnosis not present

## 2021-06-18 DIAGNOSIS — Z794 Long term (current) use of insulin: Secondary | ICD-10-CM | POA: Diagnosis not present

## 2021-06-18 DIAGNOSIS — E785 Hyperlipidemia, unspecified: Secondary | ICD-10-CM | POA: Diagnosis not present

## 2021-06-18 DIAGNOSIS — Z79899 Other long term (current) drug therapy: Secondary | ICD-10-CM | POA: Diagnosis not present

## 2021-06-18 DIAGNOSIS — D631 Anemia in chronic kidney disease: Secondary | ICD-10-CM | POA: Diagnosis not present

## 2021-06-18 DIAGNOSIS — K31819 Angiodysplasia of stomach and duodenum without bleeding: Secondary | ICD-10-CM | POA: Diagnosis not present

## 2021-06-18 DIAGNOSIS — E669 Obesity, unspecified: Secondary | ICD-10-CM | POA: Diagnosis not present

## 2021-06-18 DIAGNOSIS — Z6837 Body mass index (BMI) 37.0-37.9, adult: Secondary | ICD-10-CM | POA: Diagnosis not present

## 2021-06-18 DIAGNOSIS — N183 Chronic kidney disease, stage 3 unspecified: Secondary | ICD-10-CM | POA: Diagnosis not present

## 2021-06-18 DIAGNOSIS — K766 Portal hypertension: Secondary | ICD-10-CM | POA: Diagnosis not present

## 2021-06-18 DIAGNOSIS — K3189 Other diseases of stomach and duodenum: Secondary | ICD-10-CM | POA: Diagnosis not present

## 2021-06-18 DIAGNOSIS — I8510 Secondary esophageal varices without bleeding: Secondary | ICD-10-CM | POA: Diagnosis not present

## 2021-06-18 DIAGNOSIS — G4733 Obstructive sleep apnea (adult) (pediatric): Secondary | ICD-10-CM | POA: Diagnosis not present

## 2021-06-24 ENCOUNTER — Ambulatory Visit: Payer: Federal, State, Local not specified - PPO | Admitting: Family Medicine

## 2021-06-25 ENCOUNTER — Ambulatory Visit (INDEPENDENT_AMBULATORY_CARE_PROVIDER_SITE_OTHER): Payer: Federal, State, Local not specified - PPO | Admitting: Cardiology

## 2021-06-25 ENCOUNTER — Encounter: Payer: Self-pay | Admitting: Cardiology

## 2021-06-25 ENCOUNTER — Other Ambulatory Visit: Payer: Self-pay

## 2021-06-25 VITALS — BP 130/80 | HR 68 | Ht 70.0 in | Wt 267.0 lb

## 2021-06-25 DIAGNOSIS — I25119 Atherosclerotic heart disease of native coronary artery with unspecified angina pectoris: Secondary | ICD-10-CM | POA: Diagnosis not present

## 2021-06-25 DIAGNOSIS — N1832 Chronic kidney disease, stage 3b: Secondary | ICD-10-CM | POA: Diagnosis not present

## 2021-06-25 DIAGNOSIS — I25810 Atherosclerosis of coronary artery bypass graft(s) without angina pectoris: Secondary | ICD-10-CM

## 2021-06-25 DIAGNOSIS — I350 Nonrheumatic aortic (valve) stenosis: Secondary | ICD-10-CM | POA: Diagnosis not present

## 2021-06-25 NOTE — Progress Notes (Signed)
Cardiology Office Note  Date: 06/25/2021   ID: [name], DOB 1961-04-16, MRN [id number]  PCP:  Christopher Sites, MD  Cardiologist:  Christopher Lesches, MD Electrophysiologist:  None   Chief Complaint  Patient presents with   Cardiac follow-up    History of Present Illness: [name] is a 60 y.o. male last seen in March.  He presents for a follow-up visit.  Reports gradual weight gain over the last few months, up at least 10 pounds.  He does not describe any angina symptoms.  It was hospitalized in April with acute kidney injury associated with hyperkalemia and also suspected mild pancreatitis.  Also found to have adrenal insufficiency.  Hypotension improved with midodrine as well as hydrocortisone.  He was taken off Aldactone, Demadex, and nadolol at discharge however all subsequently resumed.  He has had interval follow-up at [facility] in the hepatology clinic, I reviewed the note from June.  He has been back on Demadex 20 mg daily along with potassium supplement, no longer on Benicar, tolerating Corgard, and remains on midodrine with good blood pressure today.  He follows up at [facility] this afternoon.  Past Medical History:  Diagnosis Date   Anemia    Arthritis    CAD (coronary artery disease)    Multivessel disease status post CABG 08/2015   Cirrhosis (HCC)    CKD (chronic kidney disease) stage 3, GFR 30-59 ml/min (HCC)    Diastolic CHF (HCC)    Essential hypertension    Hyperlipidemia    OSA on CPAP    Polyclonal gammopathy    Thrombocytopenia ([place]) 2016   Type 2 diabetes mellitus ([place])     Past Surgical History:  Procedure Laterality Date   APPENDECTOMY     Biceps tendon surgery Right    BIOPSY  11/22/2019   Procedure: BIOPSY;  Surgeon: Daneil Dolin, MD;  [facility]: [facility];  Service: Endoscopy;;   CARDIAC CATHETERIZATION N/A 08/25/2015   Procedure: Left Heart Cath and Coronary Angiography;  Surgeon: Belva Crome, MD;  Location: [place] CV  LAB;  Service: Cardiovascular;  Laterality: N/A;   COLONOSCOPY WITH PROPOFOL N/A 11/22/2019   Procedure: COLONOSCOPY WITH PROPOFOL;  Surgeon: Daneil Dolin, MD; Four 4-5 mm polyps, findings suggestive of portal colopathy, congested appearing colonic mucosa diffusely, rectal varices, and adequate right colon prep.  Pathology with tubular adenomas and hyperplastic polyp.  Right colon biopsy with focal active colitis.  Recommendations to repeat colonoscopy in 3 months due to poor prep.   COLONOSCOPY WITH PROPOFOL N/A 03/17/2020   Procedure: COLONOSCOPY WITH PROPOFOL;  Surgeon: Daneil Dolin, MD;  [facility];  Service: Endoscopy;  Laterality: N/A;  8:45am - pt does not need covid test, was + 2/4 <90 days   CORONARY ARTERY BYPASS GRAFT N/A 08/29/2015   Procedure: CORONARY ARTERY BYPASS GRAFTING (CABG);  Surgeon: [name], MD;  Location: [place];  Service: Open Heart Surgery;  Laterality: N/A;   ESOPHAGOGASTRODUODENOSCOPY (EGD) WITH PROPOFOL N/A 11/22/2019   Procedure: ESOPHAGOGASTRODUODENOSCOPY (EGD) WITH PROPOFOL;  Surgeon: Daneil Dolin, MD; 4 columns of grade 2-3 esophageal varices, portal gastropathy, gastric polyp/abnormal gastric mucosa s/p biopsy.  Pathology with hyperplastic polyp, mild chronic gastritis, negative H. pylori.   KNEE ARTHROSCOPY Left    TEE WITHOUT CARDIOVERSION N/A 08/29/2015   Procedure: TRANSESOPHAGEAL ECHOCARDIOGRAM (TEE);  Surgeon: [name], MD;  Location: [place];  Service: Open Heart Surgery;  Laterality: N/A;   TOTAL KNEE ARTHROPLASTY Left  03/09/2017   Procedure: TOTAL KNEE ARTHROPLASTY;  Surgeon: Carole Civil, MD;  Location: [facility] OR[facility];  Service: Orthopedics;  Laterality: Left;    Current Outpatient Medications  Medication Sig Dispense Refill   acetaminophen (TYLENOL) 325 MG tablet Take 650 mg by mouth every 6 (six) hours as needed for mild pain.     albuterol (VENTOLIN HFA) 108 (90 Base) MCG/ACT inhaler Inhale 2 puffs into the lungs  every 6 (six) hours as needed for wheezing or shortness of breath. 18 g 0   ALPRAZolam (XANAX) 0.5 MG tablet Take 1 tablet (0.5 mg total) by mouth daily as needed. 10 tablet 0   Armodafinil 200 MG TABS Take 200 mg by mouth daily as needed (for sleepiness). 30 tablet 0   aspirin EC 81 MG tablet Take 81 mg by mouth at bedtime.      budesonide (PULMICORT) 180 MCG/ACT inhaler Inhale 1 puff into the lungs daily.     Cholecalciferol (DIALYVITE VITAMIN D 5000) 125 MCG (5000 UT) capsule Take 5,000 Units by mouth in the morning.      Continuous Blood Gluc Receiver (FREESTYLE LIBRE 14 DAY READER) DEVI 1 reader 10 each 0   Continuous Blood Gluc Sensor (FREESTYLE LIBRE 14 DAY SENSOR) MISC 1 sensor     Elastic Bandages & Supports (MEDICAL COMPRESSION STOCKINGS) MISC 1 each by Does not apply route as directed. Low pressure knee high compression stockings Dx: leg swelling 1 each 0   ferrous sulfate 325 (65 FE) MG tablet Take 325 mg by mouth in the morning.      Flaxseed, Linseed, (FLAXSEED OIL) 1000 MG CAPS Take 1,000 mg by mouth at bedtime.     hydrochlorothiazide (HYDRODIURIL) 25 MG tablet Take 0.5 tablets by mouth daily.     INS SYRINGE/NEEDLE .5CC/28G 28G X 1/2" 0.5 ML MISC Inject 1 Syringe into the skin See admin instructions. 100 each 0   insulin aspart (NOVOLOG) 100 UNIT/ML injection Inject 5 Units into the skin 3 (three) times daily before meals. Special Instructions: If accu-check is greater than 150. Hold for accu-check 150 or below. With Meals 10 mL 0   insulin degludec (TRESIBA FLEXTOUCH) 200 UNIT/ML FlexTouch Pen Inject 40 Units into the skin in the morning. Give 30 units Subcutaneous  at Bedtime 8:00 pm 15 mL 0   lactulose (CHRONULAC) 10 GM/15ML solution Take 30 mLs (20 g total) by mouth daily. 900 mL 0   lidocaine (LIDODERM) 5 % Place 1 patch onto the skin daily. Remove & Discard patch within 12 hours or as directed by MD 30 patch 0   lidocaine (XYLOCAINE) 2 % solution 5 mLs as needed.      loratadine (CLARITIN) 10 MG tablet Take 10 mg by mouth in the morning.      Magnesium 250 MG TABS Take 250 mg by mouth daily.     midodrine (PROAMATINE) 10 MG tablet Take 10 mg by mouth 3 (three) times daily.     Misc Natural Products (ELDERBERRY ZINC/VIT C/IMMUNE) LOZG Take 1 lozenge by mouth at bedtime.     nadolol (CORGARD) 20 MG tablet Take by mouth daily.     omeprazole (PRILOSEC) 40 MG capsule Take 1 capsule (40 mg total) by mouth at bedtime. 30 capsule 0   OZEMPIC, 1 MG/DOSE, 4 MG/3ML SOPN Inject 1 mg into the skin once a week. 3 mL 0   potassium chloride SA (KLOR-CON) 20 MEQ tablet Take 20 mEq by mouth 2 (two) times daily.     pravastatin (  PRAVACHOL) 20 MG tablet TAKE 1 TABLET(20 MG) BY MOUTH AT BEDTIME 30 tablet 0   torsemide (DEMADEX) 20 MG tablet Take 40 mg by mouth 2 (two) times daily.     TURMERIC CURCUMIN PO Take 2,000 mg by mouth at bedtime.      venlafaxine XR (EFFEXOR-XR) 37.5 MG 24 hr capsule Take 37.5 mg by mouth daily.     No current facility-administered medications for this visit.   Allergies:  Fluticasone   ROS: No palpitations or syncope.  Physical Exam: VS:  BP 130/80   Pulse 68   Ht 5\' 10"  (1.778 m)   Wt 267 lb (121.1 kg)   SpO2 98%   BMI 38.31 kg/m , BMI Body mass index is 38.31 kg/m.  Wt Readings from Last 3 Encounters:  06/25/21 267 lb (121.1 kg)  04/07/21 253 lb (114.8 kg)  03/24/21 286 lb 2.5 oz (129.8 kg)    General: Patient appears comfortable at rest. HEENT: Conjunctiva and lids normal, wearing a mask. Neck: Supple, no elevated JVP. Lungs: Clear to auscultation, nonlabored breathing at rest. Cardiac: Regular rate and rhythm, no S3 or significant systolic murmur, no pericardial rub. Abdomen: Protuberant, nontender, bowel sounds present. Extremities: 2+ edema with venous stasis, compression stockings not in place today.  ECG:  An ECG dated 03/16/2021 was personally reviewed today and demonstrated:  Sinus rhythm with low voltage.  Recent  Labwork: 02/02/2021: TSH 1.901 03/19/2021: ALT 16; AST 34 03/23/2021: BUN 37; Creatinine, Ser 1.52; Magnesium 1.9; Potassium 4.0; Sodium 137 03/31/2021: Hemoglobin 11.0; Platelets 75  June 2022: Hemoglobin 8.0, platelets 82, AST 38, ALT 18, potassium 4.4, creatinine 1.6  Other Studies Reviewed Today:  Echocardiogram 01/06/2021 ([facility]): INTERPRETATION ---------------------------------------------------------------  NORMAL LEFT VENTRICULAR SYSTOLIC FUNCTION  NORMAL RIGHT VENTRICULAR SYSTOLIC FUNCTION  NO VALVULAR REGURGITATION  VALVULAR STENOSIS: MILD AS  POOR SOUND TRANSMISSION   PET myocardial perfusion imaging 01/16/2021 ([facility]): Image Analysis:     Rest Stress Conclusion   Basal Anterior Normal Normal Normal   Basal Ant Septal Normal Normal Normal   Basal Inf Septal Normal Normal Normal   Basal Inferior Normal Moderate Ischemia   Basal Inf Lat Normal Normal Normal   Basal Ant Lat Normal Normal Normal   Mid Anterior Normal Normal Normal   Mid Ant Septal Normal Normal Normal   Mid Inf Septal Normal Normal Normal   Mid Inferior Mild Moderate Infrc/Isch   Mid Inf Lateral Normal Normal Normal   Mid Ant Lateral Normal Normal Normal   Apical Anterior Normal Normal Normal   Apical Septal Normal Normal Normal   Apical Inferior Normal Normal Normal   Apical Lateral Normal Normal Normal   Apex Normal Normal Normal   SPECT RESULTS   Technical Quality: Excellent    PERFUSION: Abnormal rest and stress perfusion.     STRESS TEST Pharmacologic   Protocol: Regadenoson Dose: 0.4 mg   Stage Dose Time (min) BP (mmHg) HR (bpm) Comments   Resting   129/56 65 PVC   Stage 1 0.4 1:00  94     Recovery 1  2:00  93 Technical difficulties with BP monitor   Recovery 2  2:00  91 Technical difficulties with BP monitor   Recovery 3  2:00 110/58 90     Recovery 4  2:00 111/56 88 PVC    Resting HR (bpm): 65 Resting BP (mmHg): 129 / 56 MaxPHR: 159 Target HR (bpm): 135   Peak HR (bpm): 94 Peak BP (mmHg):  111 / 56 % MaxPHR:  59 Double Product: 25852   BP Response: Normal blood pressure response during stress   Stress Termination: Planned / protocol completed   Stress Symptoms: No CP reported.     ECG Negative   ECG Interpretation: Heart rate is N/A    IMAGE PROTOCOL Rest/Stress 1 Day Modality: PET- Discovery MI   Radiopharmaceutical Dose (mCi) Imaging  Date Inj Time Img Time Rescan Reason Rescan Time   Rest: RB-82 Rubidium, IV 35.08 01/16/2021 1400 1400   Stress: RB-82 Rubidium, IV 34.93 01/16/2021 1415 1415   Rest Administration Site: Left antecubital fossa Tech Administering Rest Dose: Alessandra Grout, CNMT   Stress Administration Site: Left antecubital fossa Tech Administering Stress Dose: Bijal Shah, CNMT    FUNCTIONAL RESULTS (calculated via Gated SPECT)   Post Stress Image LV EF: 64 %   Stress EDV: 63 ml EDVI: 26 ml/m TID:   Stress ESV: 23 ml ESVI: 9 ml/m   Rest Image LV EF: 60 %   Rest Image LV EF:   Rest EDV: 63 ml EDVI: 26 ml/m   Rest ESV: 25 ml ESVI: 10 ml/m          Wall Motion   Wall Thickening   Anterior: Normal Normal   Apex: Normal Normal   Inferior: Normal Normal   Lateral: Normal Normal   Septal: Normal Normal    LV Ejection Fraction  & Size: Normal left ventricular size and ejection fraction.     LV Function & Wall Thickening: Normal left ventricular wall motion and thickening.     FINAL COMMENTS   Cardiac PET with concurrent CT was performed. Per-infarct ischemia and ischemia in the inferior wall.   Assessment and Plan:  1.  Multivessel CAD status post CABG in 2016.  He does not report any angina on medical therapy and underwent a PET myocardial perfusion study earlier this year at [facility] indicating inferior wall scar with mild peri-infarct ischemia which we will manage medically.  Continue aspirin and Pravachol.  2.  Mild aortic stenosis.  3.  CKD stage IIIb, creatinine 1.6 in June.  4.  Cirrhosis with portal hypertension, continues to follow closely at  [facility] on the liver transplant list.  He remains on nadolol.  5.  Hypotension with component of possible adrenal insufficiency.  Blood pressure stable on midodrine.  He is no longer on Benicar.  6.  Gradually increasing weight complicated by cirrhosis and chronic diastolic heart failure as well as CKD stage IIIb.  He will double Demadex for the next 2 to 3 days and then resume prior dose.  Medication Adjustments/Labs and Tests Ordered: Current medicines are reviewed at length with the patient today.  Concerns regarding medicines are outlined above.   Tests Ordered: No orders of the defined types were placed in this encounter.   Medication Changes: No orders of the defined types were placed in this encounter.   Disposition:  Follow up  3 months.  Signed, Satira Sark, MD, [facility] 06/25/2021 9:11 AM    [place] [facility] [address] [place], [place] [address] Phone: [phone]; Fax: [phone]

## 2021-06-25 NOTE — Patient Instructions (Signed)
Medication Instructions:   Take Demadex 40 mg daily for 3 days and then go back to your regular dose of 20 mg daily   *If you need a refill on your cardiac medications before your next appointment, please call your pharmacy*   Lab Work: None today  If you have labs (blood work) drawn today and your tests are completely normal, you will receive your results only by: [place] (if you have MyChart) OR A paper copy in the mail If you have any lab test that is abnormal or we need to change your treatment, we will call you to review the results.   Testing/Procedures: None today    Follow-Up: At [facility], you and your health needs are our priority.  As part of our continuing mission to provide you with exceptional heart care, we have created designated Provider Care Teams.  These Care Teams include your primary Cardiologist (physician) and Advanced Practice Providers (APPs -  Physician Assistants and Nurse Practitioners) who all work together to provide you with the care you need, when you need it.  We recommend signing up for the patient portal called "MyChart".  Sign up information is provided on this After Visit Summary.  MyChart is used to connect with patients for Virtual Visits (Telemedicine).  Patients are able to view lab/test results, encounter notes, upcoming appointments, etc.  Non-urgent messages can be sent to your provider as well.   To learn more about what you can do with MyChart, go to [email or web address].    Your next appointment:   3 month(s)  The format for your next appointment:   In Person  Provider:   Rozann Lesches, MD   Other Instructions None

## 2021-07-08 ENCOUNTER — Inpatient Hospital Stay (HOSPITAL_COMMUNITY): Payer: Federal, State, Local not specified - PPO | Attending: Hematology

## 2021-07-08 ENCOUNTER — Other Ambulatory Visit: Payer: Self-pay

## 2021-07-08 DIAGNOSIS — I10 Essential (primary) hypertension: Secondary | ICD-10-CM | POA: Diagnosis not present

## 2021-07-08 DIAGNOSIS — M1991 Primary osteoarthritis, unspecified site: Secondary | ICD-10-CM | POA: Diagnosis not present

## 2021-07-08 DIAGNOSIS — J329 Chronic sinusitis, unspecified: Secondary | ICD-10-CM | POA: Diagnosis not present

## 2021-07-08 DIAGNOSIS — J45909 Unspecified asthma, uncomplicated: Secondary | ICD-10-CM | POA: Diagnosis not present

## 2021-07-08 NOTE — Progress Notes (Deleted)
PATIENT: [name] DOB: 1961-09-16  REASON FOR VISIT: follow up HISTORY FROM: patient  No chief complaint on file.    HISTORY OF PRESENT ILLNESS: 07/08/21 ALL:  ZIDAN HELGET is a 60 y.o. male here today for follow up for OSA on CPAP. He has been on CPAP for years but recently received new machine. HST 11/2020 showed overall mild OSa with AHI of 5.9 and REM AHI of 19.1/hr, however, due to medical history and years of daytime sleepiness improved on CPAP, Dr Dohmeier recommended he continue CPAP therapy.       HISTORY: (copied from Dr Dohmeier's previous note)  Interval history : 10-16-2020: I have the pleasure of meeting today with [name]. Keehan who is called [name].  He is a meanwhile 60 year old and some 60 year old Caucasian gentleman who has followed in our practice for over 5 years with a diagnosis of obstructive sleep apnea snoring and being treated on CPAP.  His risk factors have always included morbid obesity. He states that he was recently told that he will need a liver transplant in response to a NASH nonalcoholic steatosis of the liver. Liver is cirrhotic. Had pancreatic failure-  He states that his pancreas makes neither digestive enzymes nor insulin. His diabetes is better controlled - he has a no needle check device.    He endorsed today the Epworth Sleepiness Scale at only 4 points which is a remarkable response in the past he had sometimes been excessively daytime sleepy.  His fatigue score was high also at 53 out of 63 points and he stated today that he does feel sometimes depressed.  This is a score of 3 out of 15 points so a mild depression.  His C-Flex machine was interrogated today is a DreamStation auto CPAP this device had been recalled.  He has used it compliantly for 97% of the last 30 days usual average daily use is 4 hours 42 minutes only but he achieves a reduced AHI of only 0.3/h and his CPAP is set at 9 cm water.  There are sometimes  air leaks but these seem to be related to movement going to the bathroom etc.  Coumadin humidification setting has been medium, he has a C-Flex function of 1 cm water, the equivalent of an expiratory pressure relief.  He is on 10 cm ramp starting at 4 cmH2O pressure and reaching his max pressure of 9 cm.   He never used an ozone cleaner- his machine doesn't make noise and he has not noted particles in his water-chamber.   Due to supply chain delays, I cannot promise a replacement device to reach him  before January anyway. So I will order an HST to establish his baseline this year and order his new machine based on that- in the meantime he will continue to use the [employer] device.   REVIEW OF SYSTEMS: Out of a complete 14 system review of symptoms, the patient complains only of the following symptoms, and all other reviewed systems are negative.  ESS: FSS:  ALLERGIES: Allergies  Allergen Reactions   Fluticasone Rash    HOME MEDICATIONS: Outpatient Medications Prior to Visit  Medication Sig Dispense Refill   acetaminophen (TYLENOL) 325 MG tablet Take 650 mg by mouth every 6 (six) hours as needed for mild pain.     albuterol (VENTOLIN HFA) 108 (90 Base) MCG/ACT inhaler Inhale 2 puffs into the lungs every 6 (six) hours as needed for wheezing or shortness of breath. 18 g  0   ALPRAZolam (XANAX) 0.5 MG tablet Take 1 tablet (0.5 mg total) by mouth daily as needed. 10 tablet 0   Armodafinil 200 MG TABS Take 200 mg by mouth daily as needed (for sleepiness). 30 tablet 0   aspirin EC 81 MG tablet Take 81 mg by mouth at bedtime.      budesonide (PULMICORT) 180 MCG/ACT inhaler Inhale 1 puff into the lungs daily.     Cholecalciferol (DIALYVITE VITAMIN D 5000) 125 MCG (5000 UT) capsule Take 5,000 Units by mouth in the morning.      Continuous Blood Gluc Receiver (FREESTYLE LIBRE 14 DAY READER) DEVI 1 reader 10 each 0   Continuous Blood Gluc Sensor (FREESTYLE LIBRE 14 DAY SENSOR) MISC 1  sensor     Elastic Bandages & Supports (MEDICAL COMPRESSION STOCKINGS) MISC 1 each by Does not apply route as directed. Low pressure knee high compression stockings Dx: leg swelling 1 each 0   ferrous sulfate 325 (65 FE) MG tablet Take 325 mg by mouth in the morning.      Flaxseed, Linseed, (FLAXSEED OIL) 1000 MG CAPS Take 1,000 mg by mouth at bedtime.     hydrochlorothiazide (HYDRODIURIL) 25 MG tablet Take 0.5 tablets by mouth daily.     INS SYRINGE/NEEDLE .5CC/28G 28G X 1/2" 0.5 ML MISC Inject 1 Syringe into the skin See admin instructions. 100 each 0   insulin aspart (NOVOLOG) 100 UNIT/ML injection Inject 5 Units into the skin 3 (three) times daily before meals. Special Instructions: If accu-check is greater than 150. Hold for accu-check 150 or below. With Meals 10 mL 0   insulin degludec (TRESIBA FLEXTOUCH) 200 UNIT/ML FlexTouch Pen Inject 40 Units into the skin in the morning. Give 30 units Subcutaneous  at Bedtime 8:00 pm 15 mL 0   lactulose (CHRONULAC) 10 GM/15ML solution Take 30 mLs (20 g total) by mouth daily. 900 mL 0   lidocaine (LIDODERM) 5 % Place 1 patch onto the skin daily. Remove & Discard patch within 12 hours or as directed by MD 30 patch 0   lidocaine (XYLOCAINE) 2 % solution 5 mLs as needed.     loratadine (CLARITIN) 10 MG tablet Take 10 mg by mouth in the morning.      Magnesium 250 MG TABS Take 250 mg by mouth daily.     midodrine (PROAMATINE) 10 MG tablet Take 10 mg by mouth 3 (three) times daily.     Misc Natural Products (ELDERBERRY ZINC/VIT C/IMMUNE) LOZG Take 1 lozenge by mouth at bedtime.     nadolol (CORGARD) 20 MG tablet Take by mouth daily.     omeprazole (PRILOSEC) 40 MG capsule Take 1 capsule (40 mg total) by mouth at bedtime. 30 capsule 0   OZEMPIC, 1 MG/DOSE, 4 MG/3ML SOPN Inject 1 mg into the skin once a week. 3 mL 0   potassium chloride SA (KLOR-CON) 20 MEQ tablet Take 20 mEq by mouth 2 (two) times daily.     pravastatin (PRAVACHOL) 20 MG tablet TAKE 1  TABLET(20 MG) BY MOUTH AT BEDTIME 30 tablet 0   torsemide (DEMADEX) 20 MG tablet Take 40 mg by mouth 2 (two) times daily.     TURMERIC CURCUMIN PO Take 2,000 mg by mouth at bedtime.      venlafaxine XR (EFFEXOR-XR) 37.5 MG 24 hr capsule Take 37.5 mg by mouth daily.     No facility-administered medications prior to visit.    PAST MEDICAL HISTORY: Past Medical History:  Diagnosis Date  Anemia    Arthritis    CAD (coronary artery disease)    Multivessel disease status post CABG 08/2015   Cirrhosis (HCC)    CKD (chronic kidney disease) stage 3, GFR 30-59 ml/min (HCC)    Diastolic CHF (HCC)    Essential hypertension    Hyperlipidemia    OSA on CPAP    Polyclonal gammopathy    Thrombocytopenia ([place]) 2016   Type 2 diabetes mellitus ([place])     PAST SURGICAL HISTORY: Past Surgical History:  Procedure Laterality Date   APPENDECTOMY     Biceps tendon surgery Right    BIOPSY  11/22/2019   Procedure: BIOPSY;  Surgeon: Daneil Dolin, MD;  [facility];  Service: Endoscopy;;   CARDIAC CATHETERIZATION N/A 08/25/2015   Procedure: Left Heart Cath and Coronary Angiography;  Surgeon: Belva Crome, MD;  Location: [facility];  Service: Cardiovascular;  Laterality: N/A;   COLONOSCOPY WITH PROPOFOL N/A 11/22/2019   Procedure: COLONOSCOPY WITH PROPOFOL;  Surgeon: Daneil Dolin, MD; Four 4-5 mm polyps, findings suggestive of portal colopathy, congested appearing colonic mucosa diffusely, rectal varices, and adequate right colon prep.  Pathology with tubular adenomas and hyperplastic polyp.  Right colon biopsy with focal active colitis.  Recommendations to repeat colonoscopy in 3 months due to poor prep.   COLONOSCOPY WITH PROPOFOL N/A 03/17/2020   Procedure: COLONOSCOPY WITH PROPOFOL;  Surgeon: Daneil Dolin, MD;  [facility];  Service: Endoscopy;  Laterality: N/A;  8:45am - pt does not need covid test, was + 2/4 <90 days   CORONARY ARTERY BYPASS GRAFT N/A 08/29/2015    Procedure: CORONARY ARTERY BYPASS GRAFTING (CABG);  Surgeon: [name], MD;  Location: [facility];  Service: Open Heart Surgery;  Laterality: N/A;   ESOPHAGOGASTRODUODENOSCOPY (EGD) WITH PROPOFOL N/A 11/22/2019   Procedure: ESOPHAGOGASTRODUODENOSCOPY (EGD) WITH PROPOFOL;  Surgeon: Daneil Dolin, MD; 4 columns of grade 2-3 esophageal varices, portal gastropathy, gastric polyp/abnormal gastric mucosa s/p biopsy.  Pathology with hyperplastic polyp, mild chronic gastritis, negative H. pylori.   KNEE ARTHROSCOPY Left    TEE WITHOUT CARDIOVERSION N/A 08/29/2015   Procedure: TRANSESOPHAGEAL ECHOCARDIOGRAM (TEE);  Surgeon: [name], MD;  Location: [facility];  Service: Open Heart Surgery;  Laterality: N/A;   TOTAL KNEE ARTHROPLASTY Left 03/09/2017   Procedure: TOTAL KNEE ARTHROPLASTY;  Surgeon: Carole Civil, MD;  Location: [facility];  Service: Orthopedics;  Laterality: Left;    FAMILY HISTORY: Family History  Problem Relation Age of Onset   Arthritis Other    Cancer Other    Diabetes Other    CAD Father    Diabetes Mellitus II Father    Liver cancer Father    Hodgkin's lymphoma Father    CAD Brother    Diabetes Mellitus II Brother    ALS Mother    Diabetes Mellitus II Sister    Diabetes Mellitus II Brother    Diabetes Mellitus II Maternal Grandmother    Aneurysm Maternal Grandmother    Cancer Maternal Grandfather    Anesthesia problems Neg Hx    Hypotension Neg Hx    Malignant hyperthermia Neg Hx    Pseudochol deficiency Neg Hx    Colon cancer Neg Hx     SOCIAL HISTORY: Social History   Socioeconomic History   Marital status: Married    Spouse name: Not on file   Number of children: 0   Years of education: college   Highest education level: Not on file  Occupational History   Occupation: Magazine features editor: [employer]  Tobacco Use   Smoking status: Never   Smokeless tobacco: Never  Vaping Use   Vaping Use: Never used  Substance and Sexual  Activity   Alcohol use: No    Alcohol/week: 0.0 standard drinks   Drug use: No   Sexual activity: Not Currently  Other Topics Concern   Not on file  Social History Narrative   Not on file   Social Determinants of Health   Financial Resource Strain: Not on file  Food Insecurity: Not on file  Transportation Needs: Not on file  Physical Activity: Not on file  Stress: Not on file  Social Connections: Not on file  Intimate Partner Violence: Not on file     PHYSICAL EXAM  There were no vitals filed for this visit. There is no height or weight on file to calculate BMI.  Generalized: Well developed, in no acute distress  Cardiology: normal rate and rhythm, no murmur noted Respiratory: clear to auscultation bilaterally  Neurological examination  Mentation: Alert oriented to time, place, history taking. Follows all commands speech and language fluent Cranial nerve II-XII: Pupils were equal round reactive to light. Extraocular movements were full, visual field were full  Motor: The motor testing reveals 5 over 5 strength of all 4 extremities. Good symmetric motor tone is noted throughout.  Gait and station: Gait is normal.    DIAGNOSTIC DATA (LABS, IMAGING, TESTING) - I reviewed patient records, labs, notes, testing and imaging myself where available.  No flowsheet data found.   Lab Results  Component Value Date   WBC 7.2 03/31/2021   HGB 11.0 (L) 03/31/2021   HCT 34.3 (L) 03/31/2021   MCV 105.2 (H) 03/31/2021   PLT 75 (L) 03/31/2021      Component Value Date/Time   NA 137 03/23/2021 0519   NA 140 12/15/2018 1525   K 4.0 03/23/2021 0519   CL 107 03/23/2021 0519   CO2 21 (L) 03/23/2021 0519   GLUCOSE 104 (H) 03/23/2021 0519   BUN 37 (H) 03/23/2021 0519   BUN 20 12/15/2018 1525   CREATININE 1.52 (H) 03/23/2021 0519   CREATININE 1.40 (H) 01/09/2020 1204   CALCIUM 9.0 03/23/2021 0519   PROT 5.7 (L) 03/19/2021 0636   PROT 7.6 12/25/2018 1223   ALBUMIN 2.8 (L)  03/19/2021 0636   ALBUMIN 3.9 12/15/2018 1525   AST 34 03/19/2021 0636   AST 42 (H) 05/03/2019 1005   ALT 16 03/19/2021 0636   ALT 19 05/03/2019 1005   ALKPHOS 39 03/19/2021 0636   BILITOT 1.5 (H) 03/19/2021 0636   BILITOT 1.0 05/03/2019 1005   GFRNONAA 52 (L) 03/23/2021 0519   GFRNONAA 54 (L) 01/09/2020 1204   GFRAA 27 (L) 08/25/2020 1223   GFRAA 63 01/09/2020 1204   Lab Results  Component Value Date   CHOL 120 05/25/2019   HDL 38 (L) 05/25/2019   LDLCALC 55 05/25/2019   TRIG 136 05/25/2019   CHOLHDL 3.2 05/25/2019   Lab Results  Component Value Date   HGBA1C 7.7 (H) 03/17/2021   Lab Results  Component Value Date   VITAMINB12 470 03/21/2021   Lab Results  Component Value Date   TSH 1.901 02/02/2021     ASSESSMENT AND PLAN 60 y.o. year old male  has a past medical history of Anemia, Arthritis, CAD (coronary artery disease), Cirrhosis ([place]), CKD (chronic kidney disease) stage 3, GFR 30-59 ml/min ([place]), Diastolic CHF ([place]), Essential  hypertension, Hyperlipidemia, OSA on CPAP, Polyclonal gammopathy, Thrombocytopenia ([place]) (2016), and Type 2 diabetes mellitus ([place]). here with   No diagnosis found.    [name] is doing well on CPAP therapy. Compliance report reveals ***. *** was encouraged to continue using CPAP nightly and for greater than 4 hours each night. We will update supply orders as indicated. Risks of untreated sleep apnea review and education materials provided. Healthy lifestyle habits encouraged. *** will follow up in ***, sooner if needed. *** verbalizes understanding and agreement with this plan.    No orders of the defined types were placed in this encounter.    No orders of the defined types were placed in this encounter.     Debbora Presto, FNP-C 07/08/2021, 4:00 PM [facility] [address] [place], [place] [address] [phone]

## 2021-07-09 ENCOUNTER — Ambulatory Visit: Payer: Federal, State, Local not specified - PPO | Admitting: Family Medicine

## 2021-07-10 ENCOUNTER — Other Ambulatory Visit (HOSPITAL_COMMUNITY): Payer: Self-pay

## 2021-07-10 DIAGNOSIS — D509 Iron deficiency anemia, unspecified: Secondary | ICD-10-CM

## 2021-07-10 DIAGNOSIS — D696 Thrombocytopenia, unspecified: Secondary | ICD-10-CM

## 2021-07-13 ENCOUNTER — Inpatient Hospital Stay (HOSPITAL_COMMUNITY): Payer: Federal, State, Local not specified - PPO | Attending: Hematology

## 2021-07-13 ENCOUNTER — Other Ambulatory Visit: Payer: Self-pay

## 2021-07-13 ENCOUNTER — Telehealth: Payer: Self-pay

## 2021-07-13 DIAGNOSIS — I10 Essential (primary) hypertension: Secondary | ICD-10-CM | POA: Insufficient documentation

## 2021-07-13 DIAGNOSIS — D732 Chronic congestive splenomegaly: Secondary | ICD-10-CM | POA: Insufficient documentation

## 2021-07-13 DIAGNOSIS — I129 Hypertensive chronic kidney disease with stage 1 through stage 4 chronic kidney disease, or unspecified chronic kidney disease: Secondary | ICD-10-CM | POA: Diagnosis not present

## 2021-07-13 DIAGNOSIS — D509 Iron deficiency anemia, unspecified: Secondary | ICD-10-CM | POA: Diagnosis not present

## 2021-07-13 DIAGNOSIS — M129 Arthropathy, unspecified: Secondary | ICD-10-CM | POA: Insufficient documentation

## 2021-07-13 DIAGNOSIS — K7460 Unspecified cirrhosis of liver: Secondary | ICD-10-CM | POA: Insufficient documentation

## 2021-07-13 DIAGNOSIS — E119 Type 2 diabetes mellitus without complications: Secondary | ICD-10-CM | POA: Diagnosis not present

## 2021-07-13 DIAGNOSIS — I2510 Atherosclerotic heart disease of native coronary artery without angina pectoris: Secondary | ICD-10-CM | POA: Insufficient documentation

## 2021-07-13 DIAGNOSIS — Z79899 Other long term (current) drug therapy: Secondary | ICD-10-CM | POA: Insufficient documentation

## 2021-07-13 DIAGNOSIS — E785 Hyperlipidemia, unspecified: Secondary | ICD-10-CM | POA: Diagnosis not present

## 2021-07-13 DIAGNOSIS — D696 Thrombocytopenia, unspecified: Secondary | ICD-10-CM | POA: Insufficient documentation

## 2021-07-13 DIAGNOSIS — D631 Anemia in chronic kidney disease: Secondary | ICD-10-CM | POA: Insufficient documentation

## 2021-07-13 DIAGNOSIS — N183 Chronic kidney disease, stage 3 unspecified: Secondary | ICD-10-CM | POA: Diagnosis not present

## 2021-07-13 DIAGNOSIS — Z7982 Long term (current) use of aspirin: Secondary | ICD-10-CM | POA: Insufficient documentation

## 2021-07-13 DIAGNOSIS — E2740 Unspecified adrenocortical insufficiency: Secondary | ICD-10-CM | POA: Insufficient documentation

## 2021-07-13 DIAGNOSIS — G4730 Sleep apnea, unspecified: Secondary | ICD-10-CM | POA: Diagnosis not present

## 2021-07-13 DIAGNOSIS — R5383 Other fatigue: Secondary | ICD-10-CM | POA: Diagnosis not present

## 2021-07-13 DIAGNOSIS — G4733 Obstructive sleep apnea (adult) (pediatric): Secondary | ICD-10-CM | POA: Diagnosis not present

## 2021-07-13 DIAGNOSIS — Z809 Family history of malignant neoplasm, unspecified: Secondary | ICD-10-CM | POA: Diagnosis not present

## 2021-07-13 DIAGNOSIS — I509 Heart failure, unspecified: Secondary | ICD-10-CM | POA: Insufficient documentation

## 2021-07-13 LAB — IRON AND TIBC
Iron: 19 ug/dL — ABNORMAL LOW (ref 45–182)
Saturation Ratios: 4 % — ABNORMAL LOW (ref 17.9–39.5)
TIBC: 467 ug/dL — ABNORMAL HIGH (ref 250–450)
UIBC: 448 ug/dL

## 2021-07-13 LAB — CBC WITH DIFFERENTIAL/PLATELET
Abs Immature Granulocytes: 0.06 10*3/uL (ref 0.00–0.07)
Basophils Absolute: 0 10*3/uL (ref 0.0–0.1)
Basophils Relative: 0 %
Eosinophils Absolute: 0.1 10*3/uL (ref 0.0–0.5)
Eosinophils Relative: 1 %
HCT: 29.3 % — ABNORMAL LOW (ref 39.0–52.0)
Hemoglobin: 8.3 g/dL — ABNORMAL LOW (ref 13.0–17.0)
Immature Granulocytes: 1 %
Lymphocytes Relative: 17 %
Lymphs Abs: 1.3 10*3/uL (ref 0.7–4.0)
MCH: 26.1 pg (ref 26.0–34.0)
MCHC: 28.3 g/dL — ABNORMAL LOW (ref 30.0–36.0)
MCV: 92.1 fL (ref 80.0–100.0)
Monocytes Absolute: 0.5 10*3/uL (ref 0.1–1.0)
Monocytes Relative: 7 %
Neutro Abs: 5.8 10*3/uL (ref 1.7–7.7)
Neutrophils Relative %: 74 %
Platelets: 77 10*3/uL — ABNORMAL LOW (ref 150–400)
RBC: 3.18 MIL/uL — ABNORMAL LOW (ref 4.22–5.81)
RDW: 17.3 % — ABNORMAL HIGH (ref 11.5–15.5)
WBC: 7.8 10*3/uL (ref 4.0–10.5)
nRBC: 0.9 % — ABNORMAL HIGH (ref 0.0–0.2)

## 2021-07-13 LAB — FERRITIN: Ferritin: 19 ng/mL — ABNORMAL LOW (ref 24–336)

## 2021-07-13 NOTE — Telephone Encounter (Signed)
Called and LVM for pt to r/s his initial cpap f/u appt. For compliance appt needs to be made between 9/1-10-30.   If pt calls please r/s with Dr. Brett Fairy.

## 2021-07-14 NOTE — Progress Notes (Signed)
[name], [place] [address]   CLINIC:  Medical Oncology/Hematology  PCP:  [name], [place] [address] [phone]   REASON FOR VISIT:  Follow-up for anemia and thrombocytopenia  CURRENT THERAPY: Intermittent IV iron infusions  INTERVAL HISTORY:  [name] 60 y.o. male returns for routine follow-up of his thrombocytopenia and anemia.  He was last seen by Dr. Delton Coombes on 04/07/2021.  At today's visit, he reports feeling fairly well.  No recent hospitalizations, surgeries, or changes in baseline health status.  He admits to fatigue, reports that his energy is about 50% of his usual.  He states that this happens when his blood and iron counts are low.  He denies any chest pain, exertional dyspnea, palpitations, or syncope.  He denies any overt signs of blood loss such as hematemesis, hematochezia, melena, or epistaxis.  He does report easy bruising.  He denies any fever, chills, night sweats, unintentional weight loss.  No frequent infections.  He has 50% energy and 100% appetite. He endorses that he is maintaining a stable weight.    REVIEW OF SYSTEMS:  Review of Systems  Constitutional:  Positive for fatigue. Negative for appetite change, chills, diaphoresis, fever and unexpected weight change.  HENT:   Negative for lump/mass and nosebleeds.   Eyes:  Negative for eye problems.  Respiratory:  Negative for cough, hemoptysis and shortness of breath.   Cardiovascular:  Negative for chest pain, leg swelling and palpitations.  Gastrointestinal:  Negative for abdominal pain, blood in stool, constipation, diarrhea, nausea and vomiting.  Genitourinary:  Negative for hematuria.   Musculoskeletal:  Positive for arthralgias (Right knee).  Skin: Negative.   Neurological:  Negative for dizziness, headaches and light-headedness.  Hematological:  Does not bruise/bleed easily.     PAST MEDICAL/SURGICAL HISTORY:   Past Medical History:  Diagnosis Date   Anemia    Arthritis    CAD (coronary artery disease)    Multivessel disease status post CABG 08/2015   Cirrhosis (HCC)    CKD (chronic kidney disease) stage 3, GFR 30-59 ml/min (HCC)    Diastolic CHF (HCC)    Essential hypertension    Hyperlipidemia    OSA on CPAP    Polyclonal gammopathy    Thrombocytopenia ([facility]) 2016   Type 2 diabetes mellitus ([facility])    Past Surgical History:  Procedure Laterality Date   APPENDECTOMY     Biceps tendon surgery Right    BIOPSY  11/22/2019   Procedure: BIOPSY;  Surgeon: Daneil Dolin, MD;  [facility];  Service: Endoscopy;;   CARDIAC CATHETERIZATION N/A 08/25/2015   Procedure: Left Heart Cath and Coronary Angiography;  Surgeon: Belva Crome, MD;  Location: [facility];  Service: Cardiovascular;  Laterality: N/A;   COLONOSCOPY WITH PROPOFOL N/A 11/22/2019   Procedure: COLONOSCOPY WITH PROPOFOL;  Surgeon: Daneil Dolin, MD; Four 4-5 mm polyps, findings suggestive of portal colopathy, congested appearing colonic mucosa diffusely, rectal varices, and adequate right colon prep.  Pathology with tubular adenomas and hyperplastic polyp.  Right colon biopsy with focal active colitis.  Recommendations to repeat colonoscopy in 3 months due to poor prep.   COLONOSCOPY WITH PROPOFOL N/A 03/17/2020   Procedure: COLONOSCOPY WITH PROPOFOL;  Surgeon: Daneil Dolin, MD;  [facility];  Service: Endoscopy;  Laterality: N/A;  8:45am - pt does not need covid test, was + 2/4 <90 days   CORONARY ARTERY BYPASS GRAFT N/A 08/29/2015  Procedure: CORONARY ARTERY BYPASS GRAFTING (CABG);  Surgeon: [name], MD;  Location: [facility];  Service: Open Heart Surgery;  Laterality: N/A;   ESOPHAGOGASTRODUODENOSCOPY (EGD) WITH PROPOFOL N/A 11/22/2019   Procedure: ESOPHAGOGASTRODUODENOSCOPY (EGD) WITH PROPOFOL;  Surgeon: Daneil Dolin, MD; 4 columns of grade 2-3 esophageal varices, portal gastropathy, gastric  polyp/abnormal gastric mucosa s/p biopsy.  Pathology with hyperplastic polyp, mild chronic gastritis, negative H. pylori.   KNEE ARTHROSCOPY Left    TEE WITHOUT CARDIOVERSION N/A 08/29/2015   Procedure: TRANSESOPHAGEAL ECHOCARDIOGRAM (TEE);  Surgeon: [name], MD;  Location: [facility];  Service: Open Heart Surgery;  Laterality: N/A;   TOTAL KNEE ARTHROPLASTY Left 03/09/2017   Procedure: TOTAL KNEE ARTHROPLASTY;  Surgeon: Carole Civil, MD;  Location: [facility];  Service: Orthopedics;  Laterality: Left;     SOCIAL HISTORY:  Social History   Socioeconomic History   Marital status: Married    Spouse name: Not on file   Number of children: 0   Years of education: college   Highest education level: Not on file  Occupational History   Occupation: Maintenance tech    Employer: [employer]  Tobacco Use   Smoking status: Never   Smokeless tobacco: Never  Vaping Use   Vaping Use: Never used  Substance and Sexual Activity   Alcohol use: No    Alcohol/week: 0.0 standard drinks   Drug use: No   Sexual activity: Not Currently  Other Topics Concern   Not on file  Social History Narrative   Not on file   Social Determinants of Health   Financial Resource Strain: Not on file  Food Insecurity: Not on file  Transportation Needs: Not on file  Physical Activity: Not on file  Stress: Not on file  Social Connections: Not on file  Intimate Partner Violence: Not on file    FAMILY HISTORY:  Family History  Problem Relation Age of Onset   Arthritis Other    Cancer Other    Diabetes Other    CAD Father    Diabetes Mellitus II Father    Liver cancer Father    Hodgkin's lymphoma Father    CAD Brother    Diabetes Mellitus II Brother    ALS Mother    Diabetes Mellitus II Sister    Diabetes Mellitus II Brother    Diabetes Mellitus II Maternal Grandmother    Aneurysm Maternal Grandmother    Cancer Maternal Grandfather    Anesthesia problems Neg Hx    Hypotension Neg Hx     Malignant hyperthermia Neg Hx    Pseudochol deficiency Neg Hx    Colon cancer Neg Hx     CURRENT MEDICATIONS:  Outpatient Encounter Medications as of 07/15/2021  Medication Sig   acetaminophen (TYLENOL) 325 MG tablet Take 650 mg by mouth every 6 (six) hours as needed for mild pain.   albuterol (VENTOLIN HFA) 108 (90 Base) MCG/ACT inhaler Inhale 2 puffs into the lungs every 6 (six) hours as needed for wheezing or shortness of breath.   ALPRAZolam (XANAX) 0.5 MG tablet Take 1 tablet (0.5 mg total) by mouth daily as needed.   Armodafinil 200 MG TABS Take 200 mg by mouth daily as needed (for sleepiness).   aspirin EC 81 MG tablet Take 81 mg by mouth at bedtime.    budesonide (PULMICORT) 180 MCG/ACT inhaler Inhale 1 puff into the lungs daily.   Cholecalciferol (DIALYVITE VITAMIN D 5000) 125 MCG (5000 UT) capsule Take 5,000 Units by mouth in  the morning.    Continuous Blood Gluc Receiver (FREESTYLE LIBRE 14 DAY READER) DEVI 1 reader   Continuous Blood Gluc Sensor (FREESTYLE LIBRE 14 DAY SENSOR) MISC 1 sensor   Elastic Bandages & Supports (MEDICAL COMPRESSION STOCKINGS) MISC 1 each by Does not apply route as directed. Low pressure knee high compression stockings Dx: leg swelling   ferrous sulfate 325 (65 FE) MG tablet Take 325 mg by mouth in the morning.    Flaxseed, Linseed, (FLAXSEED OIL) 1000 MG CAPS Take 1,000 mg by mouth at bedtime.   hydrochlorothiazide (HYDRODIURIL) 25 MG tablet Take 0.5 tablets by mouth daily.   INS SYRINGE/NEEDLE .5CC/28G 28G X 1/2" 0.5 ML MISC Inject 1 Syringe into the skin See admin instructions.   insulin aspart (NOVOLOG) 100 UNIT/ML injection Inject 5 Units into the skin 3 (three) times daily before meals. Special Instructions: If accu-check is greater than 150. Hold for accu-check 150 or below. With Meals   insulin degludec (TRESIBA FLEXTOUCH) 200 UNIT/ML FlexTouch Pen Inject 40 Units into the skin in the morning. Give 30 units Subcutaneous  at Bedtime 8:00 pm    lactulose (CHRONULAC) 10 GM/15ML solution Take 30 mLs (20 g total) by mouth daily.   lidocaine (LIDODERM) 5 % Place 1 patch onto the skin daily. Remove & Discard patch within 12 hours or as directed by MD   lidocaine (XYLOCAINE) 2 % solution 5 mLs as needed.   loratadine (CLARITIN) 10 MG tablet Take 10 mg by mouth in the morning.    Magnesium 250 MG TABS Take 250 mg by mouth daily.   midodrine (PROAMATINE) 10 MG tablet Take 10 mg by mouth 3 (three) times daily.   Misc Natural Products (ELDERBERRY ZINC/VIT C/IMMUNE) LOZG Take 1 lozenge by mouth at bedtime.   nadolol (CORGARD) 20 MG tablet Take by mouth daily.   omeprazole (PRILOSEC) 40 MG capsule Take 1 capsule (40 mg total) by mouth at bedtime.   OZEMPIC, 1 MG/DOSE, 4 MG/3ML SOPN Inject 1 mg into the skin once a week.   potassium chloride SA (KLOR-CON) 20 MEQ tablet Take 20 mEq by mouth 2 (two) times daily.   pravastatin (PRAVACHOL) 20 MG tablet TAKE 1 TABLET(20 MG) BY MOUTH AT BEDTIME   torsemide (DEMADEX) 20 MG tablet Take 40 mg by mouth 2 (two) times daily.   TURMERIC CURCUMIN PO Take 2,000 mg by mouth at bedtime.    venlafaxine XR (EFFEXOR-XR) 37.5 MG 24 hr capsule Take 37.5 mg by mouth daily.   No facility-administered encounter medications on file as of 07/15/2021.    ALLERGIES:  Allergies  Allergen Reactions   Fluticasone Rash     PHYSICAL EXAM:  ECOG PERFORMANCE STATUS: 1 - Symptomatic but completely ambulatory  There were no vitals filed for this visit. There were no vitals filed for this visit. Physical Exam Constitutional:      Appearance: Normal appearance. He is obese.  HENT:     Head: Normocephalic and atraumatic.     Mouth/Throat:     Mouth: Mucous membranes are moist.  Eyes:     Extraocular Movements: Extraocular movements intact.     Pupils: Pupils are equal, round, and reactive to light.  Cardiovascular:     Rate and Rhythm: Normal rate and regular rhythm.     Pulses: Normal pulses.     Heart sounds: Normal  heart sounds.  Pulmonary:     Effort: Pulmonary effort is normal.     Breath sounds: Rales (Faint crackles of bilateral lower lung  fields) present.  Abdominal:     General: Bowel sounds are normal.     Palpations: Abdomen is soft.     Tenderness: There is no abdominal tenderness.  Musculoskeletal:        General: No swelling.     Right lower leg: No edema.     Left lower leg: No edema.  Lymphadenopathy:     Cervical: No cervical adenopathy.  Skin:    General: Skin is warm and dry.     Findings: Bruising present.  Neurological:     General: No focal deficit present.     Mental Status: He is alert and oriented to person, place, and time.  Psychiatric:        Mood and Affect: Mood normal.        Behavior: Behavior normal.     LABORATORY DATA:  I have reviewed the labs as listed.  CBC    Component Value Date/Time   WBC 7.8 07/13/2021 1047   RBC 3.18 (L) 07/13/2021 1047   HGB 8.3 (L) 07/13/2021 1047   HGB 12.5 (L) 05/03/2019 1005   HGB 10.1 (L) 12/15/2018 1525   HCT 29.3 (L) 07/13/2021 1047   HCT 30.2 (L) 12/15/2018 1525   PLT 77 (L) 07/13/2021 1047   PLT 72 (L) 05/03/2019 1005   PLT 87 (LL) 12/15/2018 1525   MCV 92.1 07/13/2021 1047   MCV 84 12/15/2018 1525   MCH 26.1 07/13/2021 1047   MCHC 28.3 (L) 07/13/2021 1047   RDW 17.3 (H) 07/13/2021 1047   RDW 15.1 12/15/2018 1525   LYMPHSABS 1.3 07/13/2021 1047   LYMPHSABS 1.5 12/15/2018 1525   MONOABS 0.5 07/13/2021 1047   EOSABS 0.1 07/13/2021 1047   EOSABS 0.1 12/15/2018 1525   BASOSABS 0.0 07/13/2021 1047   BASOSABS 0.0 12/15/2018 1525   CMP Latest Ref Rng & Units 03/23/2021 03/22/2021 03/21/2021  Glucose 70 - 99 mg/dL 104(H) 140(H) 441(H)  BUN 8 - 23 mg/dL 37(H) 37(H) -  Creatinine 0.61 - 1.24 mg/dL 1.52(H) 1.40(H) -  Sodium 135 - 145 mmol/L 137 135 -  Potassium 3.5 - 5.1 mmol/L 4.0 4.4 -  Chloride 98 - 111 mmol/L 107 106 -  CO2 22 - 32 mmol/L 21(L) 21(L) -  Calcium 8.9 - 10.3 mg/dL 9.0 9.1 -  Total Protein 6.5 -  8.1 g/dL - - -  Total Bilirubin 0.3 - 1.2 mg/dL - - -  Alkaline Phos 38 - 126 U/L - - -  AST 15 - 41 U/L - - -  ALT 0 - 44 U/L - - -    DIAGNOSTIC IMAGING:  I have independently reviewed the relevant imaging and discussed with the patient.  ASSESSMENT & PLAN: 1.  Moderate thrombocytopenia, secondary to cirrhosis/splenomegaly -Thrombocytopenia since 2016.  Platelets between 55-90 since 2018. - BMBX on 01/26/2019 showed slightly hypercellular marrow for age with trilineage hematopoiesis.  No significant dyspoiesis or increase in blasts.  Plasma cells are slightly increased in number but with polyclonal staining pattern.  Abundant megakaryocytes with scattered small hypolobulated forms.  Chromosome analysis was normal.  Biopsy was done for work-up of increased IgA levels. - Ultrasound abdomen on 01/15/2020 showed nodular contour of the liver consistent with cirrhosis.  Splenomegaly with volume of 1562 mL. -Thrombocytopenia secondary to splenomegaly from cirrhosis - Most recent platelets (07/13/2021): 77 - Admits to easy bruising but denies bleeding.   - PLAN: Platelets stable at baseline.  No treatment indicated at this time.  We will continue to monitor.  2.  Normocytic to macrocytic anemia: - Combination anemia from CKD and iron deficiency - Received Feraheme on 04/20/2021 during recent hospitalization, with subsequent improvement in his hemoglobin - Colonoscopy on 03/17/2020 showed inadequate preparation of the colon with rectal varices.  Much of the colon could not be seen. - EGD (06/18/2021) at [facility]: Grade 2 esophageal varices s/p banding; gastric antral vascular ectasia without bleeding, portal hypertensive gastropathy - recommended repeat EGD in 4 weeks for endoscopic band ligation - Denies any signs or symptoms of gross rectal hemorrhage or melena.   - Symptomatic with significant fatigue  - Most recent labs (07/13/2021): Hgb 8.3 with MCV 92.1, ferritin 19, iron saturation 4%, TIBC 467, serum  iron 19 -Differential diagnosis favors anemia secondary to iron deficiency, suspect chronic GI blood loss in the setting of cirrhosis and esophageal varices; macrocytosis secondary to liver disease; element of CKD is also contributing to his anemia (creatinine 1.6 on 06/10/2021) - PLAN: Proceed with IV iron (Venofer 300-300-400).  RTC in 2 months with repeat labs and follow-up visit.  3.  Positive rheumatoid factor: -Work-up for thrombocytopenia showed rheumatoid factor positive at 58.6.  ANA was negative. -He does not have any clinical signs or symptoms of rheumatoid arthritis.  4.  Adrenal insufficiency: -He was recently hospitalized in the first week of April 2022 with adrenal insufficiency. - Was placed on hydrocortisone 20 mg twice daily and midodrine 15 mg 3 times daily - His blood pressure is well maintained at this time.   PLAN SUMMARY & DISPOSITION: -IV Venofer x3 (300-300-400) - Repeat iron panel and CBC in 2 months - RTC after labs  All questions were answered. The patient knows to call the clinic with any problems, questions or concerns.  Medical decision making: Moderate  Time spent on visit: I spent 15 minutes counseling the patient face to face. The total time spent in the appointment was 25 minutes and more than 50% was on counseling.   Harriett Rush, PA-C  07/15/2021 3:25 PM

## 2021-07-15 ENCOUNTER — Encounter (HOSPITAL_COMMUNITY): Payer: Self-pay | Admitting: Physician Assistant

## 2021-07-15 ENCOUNTER — Other Ambulatory Visit: Payer: Self-pay

## 2021-07-15 ENCOUNTER — Inpatient Hospital Stay (HOSPITAL_BASED_OUTPATIENT_CLINIC_OR_DEPARTMENT_OTHER): Payer: Federal, State, Local not specified - PPO | Admitting: Physician Assistant

## 2021-07-15 VITALS — BP 135/64 | HR 71 | Temp 96.7°F | Resp 20 | Wt 264.3 lb

## 2021-07-15 DIAGNOSIS — I509 Heart failure, unspecified: Secondary | ICD-10-CM | POA: Diagnosis not present

## 2021-07-15 DIAGNOSIS — G4730 Sleep apnea, unspecified: Secondary | ICD-10-CM | POA: Diagnosis not present

## 2021-07-15 DIAGNOSIS — D732 Chronic congestive splenomegaly: Secondary | ICD-10-CM | POA: Diagnosis not present

## 2021-07-15 DIAGNOSIS — D509 Iron deficiency anemia, unspecified: Secondary | ICD-10-CM

## 2021-07-15 DIAGNOSIS — I10 Essential (primary) hypertension: Secondary | ICD-10-CM | POA: Diagnosis not present

## 2021-07-15 DIAGNOSIS — I129 Hypertensive chronic kidney disease with stage 1 through stage 4 chronic kidney disease, or unspecified chronic kidney disease: Secondary | ICD-10-CM | POA: Diagnosis not present

## 2021-07-15 DIAGNOSIS — D696 Thrombocytopenia, unspecified: Secondary | ICD-10-CM | POA: Diagnosis not present

## 2021-07-15 DIAGNOSIS — I2510 Atherosclerotic heart disease of native coronary artery without angina pectoris: Secondary | ICD-10-CM | POA: Diagnosis not present

## 2021-07-15 DIAGNOSIS — E2740 Unspecified adrenocortical insufficiency: Secondary | ICD-10-CM | POA: Diagnosis not present

## 2021-07-15 DIAGNOSIS — E785 Hyperlipidemia, unspecified: Secondary | ICD-10-CM | POA: Diagnosis not present

## 2021-07-15 DIAGNOSIS — N183 Chronic kidney disease, stage 3 unspecified: Secondary | ICD-10-CM | POA: Diagnosis not present

## 2021-07-15 DIAGNOSIS — K7460 Unspecified cirrhosis of liver: Secondary | ICD-10-CM | POA: Diagnosis not present

## 2021-07-15 DIAGNOSIS — D631 Anemia in chronic kidney disease: Secondary | ICD-10-CM | POA: Diagnosis not present

## 2021-07-15 DIAGNOSIS — R5383 Other fatigue: Secondary | ICD-10-CM | POA: Diagnosis not present

## 2021-07-15 DIAGNOSIS — M129 Arthropathy, unspecified: Secondary | ICD-10-CM | POA: Diagnosis not present

## 2021-07-15 DIAGNOSIS — E119 Type 2 diabetes mellitus without complications: Secondary | ICD-10-CM | POA: Diagnosis not present

## 2021-07-15 HISTORY — DX: Iron deficiency anemia, unspecified: D50.9

## 2021-07-15 NOTE — Patient Instructions (Signed)
[place] at [facility] Discharge Instructions  You were seen today by Tarri Abernethy PA-C for your low platelets and your anemia.  ANEMIA: Your blood count is low (hemoglobin 8.3) with low iron.  This is likely due to some chronic slow bleeding from your intestines.  We will treat this by giving you IV iron infusions x3 doses.  We will repeat your labs and see you back for another office visit in 2 months.  LOW PLATELETS: This is related to your cirrhosis.  Your platelets are stable at their baseline, and they do not require any particular treatment at this time.  We will continue to monitor them at your follow-up visits.  LABS: Return in 2 months for repeat labs  OTHER TESTS: No other tests at this time  MEDICATIONS: No changes to home medications  FOLLOW-UP APPOINTMENT: Office visit in 2 months   Thank you for choosing [place] at [facility] to provide your oncology and hematology care.  To afford each patient quality time with our provider, please arrive at least 15 minutes before your scheduled appointment time.   If you have a lab appointment with the [facility] please come in thru the Main Entrance and check in at the main information desk.  You need to re-schedule your appointment should you arrive 10 or more minutes late.  We strive to give you quality time with our providers, and arriving late affects you and other patients whose appointments are after yours.  Also, if you no show three or more times for appointments you may be dismissed from the clinic at the providers discretion.     Again, thank you for choosing [facility].  Our hope is that these requests will decrease the amount of time that you wait before being seen by our physicians.       _____________________________________________________________  Should you have questions after your visit to [facility], please contact our office at  [phone] and follow the prompts.  Our office hours are 8:00 a.m. and 4:30 p.m. Monday - Friday.  Please note that voicemails left after 4:00 p.m. may not be returned until the following business day.  We are closed weekends and major holidays.  You do have access to a nurse 24-7, just call the main number to the clinic [phone] and do not press any options, hold on the line and a nurse will answer the phone.    For prescription refill requests, have your pharmacy contact our office and allow 72 hours.    Due to Covid, you will need to wear a mask upon entering the hospital. If you do not have a mask, a mask will be given to you at the Main Entrance upon arrival. For doctor visits, patients may have 1 support person age 62 or older with them. For treatment visits, patients can not have anyone with them due to social distancing guidelines and our immunocompromised population.

## 2021-07-20 ENCOUNTER — Inpatient Hospital Stay (HOSPITAL_COMMUNITY): Payer: Federal, State, Local not specified - PPO

## 2021-07-20 ENCOUNTER — Other Ambulatory Visit: Payer: Self-pay

## 2021-07-20 VITALS — BP 111/68 | HR 84 | Temp 98.0°F | Resp 18

## 2021-07-20 DIAGNOSIS — D539 Nutritional anemia, unspecified: Secondary | ICD-10-CM

## 2021-07-20 DIAGNOSIS — D509 Iron deficiency anemia, unspecified: Secondary | ICD-10-CM

## 2021-07-20 MED ORDER — SODIUM CHLORIDE 0.9 % IV SOLN
300.0000 mg | Freq: Once | INTRAVENOUS | Status: AC
  Administered 2021-07-20: 300 mg via INTRAVENOUS
  Filled 2021-07-20: qty 300

## 2021-07-20 MED ORDER — SODIUM CHLORIDE 0.9 % IV SOLN: Freq: Once | INTRAVENOUS | Status: AC

## 2021-07-20 NOTE — Progress Notes (Signed)
Patient presents today for Venofer infusion per providers order.  Vital signs WNL.  Patient has no new complaints today except that he had a fall over the past week.  Peripheral IV started and blood return noted pre and post infusion.  Venofer infusion given today per MD orders.  Stable during infusion without adverse affects.  Vital signs stable.  No complaints at this time.  Discharge from clinic ambulatory in stable condition.  Alert and oriented X 3.  Follow up with [facility] as scheduled.

## 2021-07-20 NOTE — Patient Instructions (Signed)
[facility]  Discharge Instructions: Thank you for choosing [facility] to provide your oncology and hematology care.  If you have a lab appointment with the Cancer Center, please come in thru the Main Entrance and check in at the main information desk.  Wear comfortable clothing and clothing appropriate for easy access to any Portacath or PICC line.   We strive to give you quality time with your provider. You may need to reschedule your appointment if you arrive late (15 or more minutes).  Arriving late affects you and other patients whose appointments are after yours.  Also, if you miss three or more appointments without notifying the office, you may be dismissed from the clinic at the provider's discretion.      For prescription refill requests, have your pharmacy contact our office and allow 72 hours for refills to be completed.    Today you received the following chemotherapy and/or immunotherapy agents Venofer      To help prevent nausea and vomiting after your treatment, we encourage you to take your nausea medication as directed.  BELOW ARE SYMPTOMS THAT SHOULD BE REPORTED IMMEDIATELY: *FEVER GREATER THAN 100.4 F (38 C) OR HIGHER *CHILLS OR SWEATING *NAUSEA AND VOMITING THAT IS NOT CONTROLLED WITH YOUR NAUSEA MEDICATION *UNUSUAL SHORTNESS OF BREATH *UNUSUAL BRUISING OR BLEEDING *URINARY PROBLEMS (pain or burning when urinating, or frequent urination) *BOWEL PROBLEMS (unusual diarrhea, constipation, pain near the anus) TENDERNESS IN MOUTH AND THROAT WITH OR WITHOUT PRESENCE OF ULCERS (sore throat, sores in mouth, or a toothache) UNUSUAL RASH, SWELLING OR PAIN  UNUSUAL VAGINAL DISCHARGE OR ITCHING   Items with * indicate a potential emergency and should be followed up as soon as possible or go to the Emergency Department if any problems should occur.  Please show the CHEMOTHERAPY ALERT CARD or IMMUNOTHERAPY ALERT CARD at check-in to the Emergency  Department and triage nurse.  Should you have questions after your visit or need to cancel or reschedule your appointment, please contact [facility] [phone]  and follow the prompts.  Office hours are 8:00 a.m. to 4:30 p.m. Monday - Friday. Please note that voicemails left after 4:00 p.m. may not be returned until the following business day.  We are closed weekends and major holidays. You have access to a nurse at all times for urgent questions. Please call the main number to the clinic [phone] and follow the prompts.  For any non-urgent questions, you may also contact your provider using MyChart. We now offer e-Visits for anyone 18 and older to request care online for non-urgent symptoms. For details visit [email or web address].   Also download the MyChart app! Go to the app store, search "MyChart", open the app, select [facility], and log in with your MyChart username and password.  Due to Covid, a mask is required upon entering the hospital/clinic. If you do not have a mask, one will be given to you upon arrival. For doctor visits, patients may have 1 support person aged 18 or older with them. For treatment visits, patients cannot have anyone with them due to current Covid guidelines and our immunocompromised population.  

## 2021-07-21 ENCOUNTER — Other Ambulatory Visit: Payer: Self-pay

## 2021-07-21 ENCOUNTER — Inpatient Hospital Stay (HOSPITAL_COMMUNITY)
Admission: EM | Admit: 2021-07-21 | Discharge: 2021-07-25 | DRG: 368 | Disposition: A | Discharge status: Home / Self Care | Payer: Federal, State, Local not specified - PPO | Attending: Internal Medicine | Admitting: Internal Medicine

## 2021-07-21 ENCOUNTER — Emergency Department (HOSPITAL_COMMUNITY): Payer: Federal, State, Local not specified - PPO

## 2021-07-21 ENCOUNTER — Encounter (HOSPITAL_COMMUNITY): Payer: Self-pay

## 2021-07-21 DIAGNOSIS — N390 Urinary tract infection, site not specified: Secondary | ICD-10-CM | POA: Diagnosis present

## 2021-07-21 DIAGNOSIS — F1010 Alcohol abuse, uncomplicated: Secondary | ICD-10-CM | POA: Diagnosis present

## 2021-07-21 DIAGNOSIS — M25669 Stiffness of unspecified knee, not elsewhere classified: Secondary | ICD-10-CM

## 2021-07-21 DIAGNOSIS — Z7682 Awaiting organ transplant status: Secondary | ICD-10-CM

## 2021-07-21 DIAGNOSIS — G9341 Metabolic encephalopathy: Secondary | ICD-10-CM | POA: Diagnosis not present

## 2021-07-21 DIAGNOSIS — I2510 Atherosclerotic heart disease of native coronary artery without angina pectoris: Secondary | ICD-10-CM | POA: Diagnosis present

## 2021-07-21 DIAGNOSIS — Z888 Allergy status to other drugs, medicaments and biological substances status: Secondary | ICD-10-CM

## 2021-07-21 DIAGNOSIS — Z8249 Family history of ischemic heart disease and other diseases of the circulatory system: Secondary | ICD-10-CM

## 2021-07-21 DIAGNOSIS — I130 Hypertensive heart and chronic kidney disease with heart failure and stage 1 through stage 4 chronic kidney disease, or unspecified chronic kidney disease: Secondary | ICD-10-CM | POA: Diagnosis present

## 2021-07-21 DIAGNOSIS — I8511 Secondary esophageal varices with bleeding: Principal | ICD-10-CM | POA: Diagnosis present

## 2021-07-21 DIAGNOSIS — I868 Varicose veins of other specified sites: Secondary | ICD-10-CM | POA: Diagnosis present

## 2021-07-21 DIAGNOSIS — N1832 Chronic kidney disease, stage 3b: Secondary | ICD-10-CM | POA: Diagnosis present

## 2021-07-21 DIAGNOSIS — Z7951 Long term (current) use of inhaled steroids: Secondary | ICD-10-CM

## 2021-07-21 DIAGNOSIS — K922 Gastrointestinal hemorrhage, unspecified: Secondary | ICD-10-CM | POA: Diagnosis not present

## 2021-07-21 DIAGNOSIS — I8510 Secondary esophageal varices without bleeding: Secondary | ICD-10-CM | POA: Diagnosis not present

## 2021-07-21 DIAGNOSIS — K7290 Hepatic failure, unspecified without coma: Secondary | ICD-10-CM | POA: Diagnosis not present

## 2021-07-21 DIAGNOSIS — D649 Anemia, unspecified: Secondary | ICD-10-CM | POA: Diagnosis present

## 2021-07-21 DIAGNOSIS — R0989 Other specified symptoms and signs involving the circulatory and respiratory systems: Secondary | ICD-10-CM | POA: Diagnosis present

## 2021-07-21 DIAGNOSIS — K766 Portal hypertension: Secondary | ICD-10-CM | POA: Diagnosis not present

## 2021-07-21 DIAGNOSIS — M23302 Other meniscus derangements, unspecified lateral meniscus, unspecified knee: Secondary | ICD-10-CM

## 2021-07-21 DIAGNOSIS — D631 Anemia in chronic kidney disease: Secondary | ICD-10-CM | POA: Diagnosis not present

## 2021-07-21 DIAGNOSIS — Z833 Family history of diabetes mellitus: Secondary | ICD-10-CM

## 2021-07-21 DIAGNOSIS — K3189 Other diseases of stomach and duodenum: Secondary | ICD-10-CM | POA: Diagnosis present

## 2021-07-21 DIAGNOSIS — K529 Noninfective gastroenteritis and colitis, unspecified: Secondary | ICD-10-CM | POA: Diagnosis present

## 2021-07-21 DIAGNOSIS — D890 Polyclonal hypergammaglobulinemia: Secondary | ICD-10-CM | POA: Diagnosis present

## 2021-07-21 DIAGNOSIS — M1710 Unilateral primary osteoarthritis, unspecified knee: Secondary | ICD-10-CM

## 2021-07-21 DIAGNOSIS — M23329 Other meniscus derangements, posterior horn of medial meniscus, unspecified knee: Secondary | ICD-10-CM

## 2021-07-21 DIAGNOSIS — E2740 Unspecified adrenocortical insufficiency: Secondary | ICD-10-CM | POA: Diagnosis present

## 2021-07-21 DIAGNOSIS — E119 Type 2 diabetes mellitus without complications: Secondary | ICD-10-CM | POA: Diagnosis present

## 2021-07-21 DIAGNOSIS — Z794 Long term (current) use of insulin: Secondary | ICD-10-CM

## 2021-07-21 DIAGNOSIS — Z7982 Long term (current) use of aspirin: Secondary | ICD-10-CM

## 2021-07-21 DIAGNOSIS — Z20822 Contact with and (suspected) exposure to covid-19: Secondary | ICD-10-CM | POA: Diagnosis present

## 2021-07-21 DIAGNOSIS — E1165 Type 2 diabetes mellitus with hyperglycemia: Secondary | ICD-10-CM | POA: Diagnosis present

## 2021-07-21 DIAGNOSIS — M179 Osteoarthritis of knee, unspecified: Secondary | ICD-10-CM

## 2021-07-21 DIAGNOSIS — Z807 Family history of other malignant neoplasms of lymphoid, hematopoietic and related tissues: Secondary | ICD-10-CM

## 2021-07-21 DIAGNOSIS — E7800 Pure hypercholesterolemia, unspecified: Secondary | ICD-10-CM | POA: Insufficient documentation

## 2021-07-21 DIAGNOSIS — R4182 Altered mental status, unspecified: Secondary | ICD-10-CM | POA: Diagnosis not present

## 2021-07-21 DIAGNOSIS — K7460 Unspecified cirrhosis of liver: Secondary | ICD-10-CM

## 2021-07-21 DIAGNOSIS — N189 Chronic kidney disease, unspecified: Secondary | ICD-10-CM | POA: Insufficient documentation

## 2021-07-21 DIAGNOSIS — IMO0002 Reserved for concepts with insufficient information to code with codable children: Secondary | ICD-10-CM | POA: Diagnosis present

## 2021-07-21 DIAGNOSIS — I10 Essential (primary) hypertension: Secondary | ICD-10-CM | POA: Diagnosis not present

## 2021-07-21 DIAGNOSIS — M25569 Pain in unspecified knee: Secondary | ICD-10-CM

## 2021-07-21 DIAGNOSIS — I959 Hypotension, unspecified: Secondary | ICD-10-CM | POA: Diagnosis not present

## 2021-07-21 DIAGNOSIS — R531 Weakness: Secondary | ICD-10-CM | POA: Diagnosis not present

## 2021-07-21 DIAGNOSIS — E1122 Type 2 diabetes mellitus with diabetic chronic kidney disease: Secondary | ICD-10-CM | POA: Diagnosis not present

## 2021-07-21 DIAGNOSIS — R4781 Slurred speech: Secondary | ICD-10-CM | POA: Diagnosis not present

## 2021-07-21 DIAGNOSIS — D509 Iron deficiency anemia, unspecified: Secondary | ICD-10-CM | POA: Diagnosis not present

## 2021-07-21 DIAGNOSIS — Z800 Family history of malignant neoplasm of digestive organs: Secondary | ICD-10-CM

## 2021-07-21 DIAGNOSIS — I5033 Acute on chronic diastolic (congestive) heart failure: Secondary | ICD-10-CM | POA: Diagnosis present

## 2021-07-21 DIAGNOSIS — G4733 Obstructive sleep apnea (adult) (pediatric): Secondary | ICD-10-CM | POA: Diagnosis present

## 2021-07-21 DIAGNOSIS — D62 Acute posthemorrhagic anemia: Secondary | ICD-10-CM | POA: Diagnosis present

## 2021-07-21 DIAGNOSIS — K317 Polyp of stomach and duodenum: Secondary | ICD-10-CM | POA: Diagnosis present

## 2021-07-21 DIAGNOSIS — Z79899 Other long term (current) drug therapy: Secondary | ICD-10-CM

## 2021-07-21 DIAGNOSIS — E785 Hyperlipidemia, unspecified: Secondary | ICD-10-CM | POA: Diagnosis present

## 2021-07-21 DIAGNOSIS — E876 Hypokalemia: Secondary | ICD-10-CM | POA: Diagnosis present

## 2021-07-21 DIAGNOSIS — K31811 Angiodysplasia of stomach and duodenum with bleeding: Secondary | ICD-10-CM | POA: Diagnosis present

## 2021-07-21 DIAGNOSIS — K31819 Angiodysplasia of stomach and duodenum without bleeding: Secondary | ICD-10-CM | POA: Diagnosis not present

## 2021-07-21 DIAGNOSIS — N179 Acute kidney failure, unspecified: Secondary | ICD-10-CM | POA: Diagnosis not present

## 2021-07-21 DIAGNOSIS — Z96652 Presence of left artificial knee joint: Secondary | ICD-10-CM | POA: Diagnosis present

## 2021-07-21 DIAGNOSIS — K7581 Nonalcoholic steatohepatitis (NASH): Secondary | ICD-10-CM | POA: Diagnosis present

## 2021-07-21 DIAGNOSIS — Z951 Presence of aortocoronary bypass graft: Secondary | ICD-10-CM

## 2021-07-21 LAB — COMPREHENSIVE METABOLIC PANEL
ALT: 14 U/L (ref 0–44)
AST: 26 U/L (ref 15–41)
Albumin: 3 g/dL — ABNORMAL LOW (ref 3.5–5.0)
Alkaline Phosphatase: 70 U/L (ref 38–126)
Anion gap: 8 (ref 5–15)
BUN: 26 mg/dL — ABNORMAL HIGH (ref 8–23)
CO2: 26 mmol/L (ref 22–32)
Calcium: 8.1 mg/dL — ABNORMAL LOW (ref 8.9–10.3)
Chloride: 98 mmol/L (ref 98–111)
Creatinine, Ser: 1.29 mg/dL — ABNORMAL HIGH (ref 0.61–1.24)
GFR, Estimated: >60 mL/min (ref >60)
Glucose, Bld: 297 mg/dL — ABNORMAL HIGH (ref 70–99)
Potassium: 3.4 mmol/L — ABNORMAL LOW (ref 3.5–5.1)
Sodium: 132 mmol/L — ABNORMAL LOW (ref 135–145)
Total Bilirubin: 1.3 mg/dL — ABNORMAL HIGH (ref 0.3–1.2)
Total Protein: 6.4 g/dL — ABNORMAL LOW (ref 6.5–8.1)

## 2021-07-21 LAB — CBC WITH DIFFERENTIAL/PLATELET
Abs Immature Granulocytes: 0.05 10*3/uL (ref 0.00–0.07)
Basophils Absolute: 0 10*3/uL (ref 0.0–0.1)
Basophils Relative: 1 %
Eosinophils Absolute: 0.1 10*3/uL (ref 0.0–0.5)
Eosinophils Relative: 1 %
HCT: 23.8 % — ABNORMAL LOW (ref 39.0–52.0)
Hemoglobin: 6.7 g/dL — CL (ref 13.0–17.0)
Immature Granulocytes: 1 %
Lymphocytes Relative: 16 %
Lymphs Abs: 1.1 10*3/uL (ref 0.7–4.0)
MCH: 25.9 pg — ABNORMAL LOW (ref 26.0–34.0)
MCHC: 28.2 g/dL — ABNORMAL LOW (ref 30.0–36.0)
MCV: 91.9 fL (ref 80.0–100.0)
Monocytes Absolute: 0.6 10*3/uL (ref 0.1–1.0)
Monocytes Relative: 9 %
Neutro Abs: 5 10*3/uL (ref 1.7–7.7)
Neutrophils Relative %: 72 %
Platelets: 95 10*3/uL — ABNORMAL LOW (ref 150–400)
RBC: 2.59 MIL/uL — ABNORMAL LOW (ref 4.22–5.81)
RDW: 16.9 % — ABNORMAL HIGH (ref 11.5–15.5)
WBC: 6.9 10*3/uL (ref 4.0–10.5)
nRBC: 0.3 % — ABNORMAL HIGH (ref 0.0–0.2)

## 2021-07-21 LAB — PROTIME-INR
INR: 1.2 (ref 0.8–1.2)
Prothrombin Time: 15 seconds (ref 11.4–15.2)

## 2021-07-21 LAB — URINALYSIS, ROUTINE W REFLEX MICROSCOPIC
Bilirubin Urine: NEGATIVE
Glucose, UA: 150 mg/dL — AB
Ketones, ur: 5 mg/dL — AB
Nitrite: NEGATIVE
Protein, ur: NEGATIVE mg/dL
Specific Gravity, Urine: 1.018 (ref 1.005–1.030)
pH: 6 (ref 5.0–8.0)

## 2021-07-21 LAB — PREPARE RBC (CROSSMATCH)

## 2021-07-21 LAB — POC OCCULT BLOOD, ED: Fecal Occult Bld: POSITIVE — AB

## 2021-07-21 LAB — RESP PANEL BY RT-PCR (FLU A&B, COVID) ARPGX2
Influenza A by PCR: NEGATIVE
Influenza B by PCR: NEGATIVE
SARS Coronavirus 2 by RT PCR: NEGATIVE

## 2021-07-21 LAB — AMMONIA: Ammonia: 68 umol/L — ABNORMAL HIGH (ref 9–35)

## 2021-07-21 LAB — CBG MONITORING, ED: Glucose-Capillary: 177 mg/dL — ABNORMAL HIGH (ref 70–99)

## 2021-07-21 MED ORDER — SODIUM CHLORIDE 0.9 % IV SOLN
50.0000 ug/h | INTRAVENOUS | Status: DC
  Administered 2021-07-21 – 2021-07-24 (×6): 50 ug/h via INTRAVENOUS
  Filled 2021-07-21 (×14): qty 1

## 2021-07-21 MED ORDER — PRAVASTATIN SODIUM 10 MG PO TABS
20.0000 mg | ORAL_TABLET | Freq: Every day | ORAL | Status: DC
  Administered 2021-07-22 – 2021-07-24 (×3): 20 mg via ORAL
  Filled 2021-07-21 (×3): qty 2

## 2021-07-21 MED ORDER — PANTOPRAZOLE 80MG IVPB - SIMPLE MED
80.0000 mg | Freq: Once | INTRAVENOUS | Status: AC
  Administered 2021-07-21: 80 mg via INTRAVENOUS
  Filled 2021-07-21: qty 100

## 2021-07-21 MED ORDER — INSULIN DETEMIR 100 UNIT/ML ~~LOC~~ SOLN
30.0000 [IU] | Freq: Every day | SUBCUTANEOUS | Status: DC
  Administered 2021-07-21 – 2021-07-24 (×4): 30 [IU] via SUBCUTANEOUS
  Filled 2021-07-21 (×6): qty 0.3

## 2021-07-21 MED ORDER — SODIUM CHLORIDE 0.9 % IV SOLN
10.0000 mL/h | Freq: Once | INTRAVENOUS | Status: AC
  Administered 2021-07-21: 10 mL/h via INTRAVENOUS

## 2021-07-21 MED ORDER — HYDROCORTISONE NA SUCCINATE PF 100 MG IJ SOLR
100.0000 mg | Freq: Once | INTRAMUSCULAR | Status: AC
  Administered 2021-07-21: 100 mg via INTRAVENOUS
  Filled 2021-07-21: qty 2

## 2021-07-21 MED ORDER — INSULIN ASPART 100 UNIT/ML IJ SOLN
0.0000 [IU] | Freq: Three times a day (TID) | INTRAMUSCULAR | Status: DC
  Administered 2021-07-22 – 2021-07-23 (×3): 2 [IU] via SUBCUTANEOUS
  Administered 2021-07-24: 5 [IU] via SUBCUTANEOUS
  Administered 2021-07-24: 2 [IU] via SUBCUTANEOUS
  Administered 2021-07-25 (×2): 3 [IU] via SUBCUTANEOUS

## 2021-07-21 MED ORDER — ALBUTEROL SULFATE HFA 108 (90 BASE) MCG/ACT IN AERS: 2.0000 | INHALATION_SPRAY | Freq: Four times a day (QID) | RESPIRATORY_TRACT | Status: DC | PRN

## 2021-07-21 MED ORDER — OCTREOTIDE LOAD VIA INFUSION
50.0000 ug | Freq: Once | INTRAVENOUS | Status: AC
  Administered 2021-07-21: 50 ug via INTRAVENOUS
  Filled 2021-07-21: qty 25

## 2021-07-21 MED ORDER — INSULIN ASPART 100 UNIT/ML IJ SOLN
0.0000 [IU] | Freq: Every day | INTRAMUSCULAR | Status: DC
  Administered 2021-07-21: 0 [IU] via SUBCUTANEOUS
  Administered 2021-07-24: 2 [IU] via SUBCUTANEOUS

## 2021-07-21 MED ORDER — PANTOPRAZOLE SODIUM 40 MG IV SOLR: 40.0000 mg | Freq: Two times a day (BID) | INTRAVENOUS | Status: DC

## 2021-07-21 MED ORDER — FUROSEMIDE 10 MG/ML IJ SOLN
40.0000 mg | Freq: Once | INTRAMUSCULAR | Status: AC
  Administered 2021-07-21: 40 mg via INTRAVENOUS
  Filled 2021-07-21: qty 4

## 2021-07-21 MED ORDER — LORATADINE 10 MG PO TABS
10.0000 mg | ORAL_TABLET | Freq: Every morning | ORAL | Status: DC
  Administered 2021-07-22 – 2021-07-25 (×4): 10 mg via ORAL
  Filled 2021-07-21 (×4): qty 1

## 2021-07-21 MED ORDER — ALPRAZOLAM 0.5 MG PO TABS
0.5000 mg | ORAL_TABLET | Freq: Every day | ORAL | Status: DC | PRN
  Administered 2021-07-21 – 2021-07-23 (×3): 0.5 mg via ORAL
  Filled 2021-07-21 (×3): qty 1

## 2021-07-21 MED ORDER — PANTOPRAZOLE INFUSION (NEW) - SIMPLE MED
8.0000 mg/h | INTRAVENOUS | Status: DC
  Administered 2021-07-21 – 2021-07-24 (×6): 8 mg/h via INTRAVENOUS
  Filled 2021-07-21: qty 80
  Filled 2021-07-21 (×2): qty 100
  Filled 2021-07-21: qty 80
  Filled 2021-07-21 (×2): qty 100
  Filled 2021-07-21: qty 80
  Filled 2021-07-21: qty 100

## 2021-07-21 MED ORDER — NADOLOL 20 MG PO TABS
20.0000 mg | ORAL_TABLET | Freq: Every day | ORAL | Status: DC
  Filled 2021-07-21: qty 1

## 2021-07-21 MED ORDER — LACTULOSE 10 GM/15ML PO SOLN
20.0000 g | Freq: Every day | ORAL | Status: DC
  Administered 2021-07-22 – 2021-07-25 (×4): 20 g via ORAL
  Filled 2021-07-21 (×4): qty 30

## 2021-07-21 MED ORDER — VENLAFAXINE HCL ER 37.5 MG PO CP24
37.5000 mg | ORAL_CAPSULE | Freq: Every day | ORAL | Status: DC
  Administered 2021-07-23 – 2021-07-25 (×3): 37.5 mg via ORAL
  Filled 2021-07-21 (×3): qty 1
  Filled 2021-07-21: qty 3
  Filled 2021-07-21 (×3): qty 1

## 2021-07-21 MED ORDER — MIDODRINE HCL 5 MG PO TABS
10.0000 mg | ORAL_TABLET | Freq: Three times a day (TID) | ORAL | Status: DC
  Filled 2021-07-21: qty 2

## 2021-07-21 MED ORDER — SODIUM CHLORIDE 0.9 % IV SOLN: 1.0000 g | INTRAVENOUS | Status: DC

## 2021-07-21 MED ORDER — FERROUS SULFATE 325 (65 FE) MG PO TABS
325.0000 mg | ORAL_TABLET | Freq: Every morning | ORAL | Status: DC
  Administered 2021-07-22: 325 mg via ORAL
  Filled 2021-07-21: qty 1

## 2021-07-21 MED ORDER — IBUPROFEN 400 MG PO TABS
400.0000 mg | ORAL_TABLET | Freq: Four times a day (QID) | ORAL | Status: DC | PRN
Start: 2021-07-21 — End: 2021-07-22

## 2021-07-21 MED ORDER — SODIUM CHLORIDE 0.9 % IV SOLN
1.0000 g | Freq: Once | INTRAVENOUS | Status: AC
  Administered 2021-07-21: 1 g via INTRAVENOUS
  Filled 2021-07-21: qty 10

## 2021-07-21 MED ORDER — SODIUM CHLORIDE 0.9 % IV SOLN
2.0000 g | INTRAVENOUS | Status: DC
  Administered 2021-07-22 – 2021-07-24 (×3): 2 g via INTRAVENOUS
  Filled 2021-07-21 (×3): qty 20

## 2021-07-21 MED ORDER — MORPHINE SULFATE (PF) 2 MG/ML IV SOLN
2.0000 mg | INTRAVENOUS | Status: DC | PRN
  Administered 2021-07-21 – 2021-07-22 (×2): 2 mg via INTRAVENOUS
  Filled 2021-07-21 (×2): qty 1

## 2021-07-21 MED ORDER — ONDANSETRON HCL 4 MG PO TABS: 4.0000 mg | ORAL_TABLET | Freq: Four times a day (QID) | ORAL | Status: DC | PRN

## 2021-07-21 MED ORDER — OXYCODONE HCL 5 MG PO TABS
5.0000 mg | ORAL_TABLET | ORAL | Status: DC | PRN
  Administered 2021-07-21 – 2021-07-23 (×3): 5 mg via ORAL
  Filled 2021-07-21 (×3): qty 1

## 2021-07-21 MED ORDER — ALBUTEROL SULFATE (2.5 MG/3ML) 0.083% IN NEBU: 2.5000 mg | INHALATION_SOLUTION | Freq: Four times a day (QID) | RESPIRATORY_TRACT | Status: DC | PRN

## 2021-07-21 MED ORDER — MIDODRINE HCL 5 MG PO TABS
10.0000 mg | ORAL_TABLET | Freq: Three times a day (TID) | ORAL | Status: DC
  Administered 2021-07-22 – 2021-07-24 (×6): 10 mg via ORAL
  Filled 2021-07-21 (×7): qty 2

## 2021-07-21 MED ORDER — ONDANSETRON HCL 4 MG/2ML IJ SOLN
4.0000 mg | Freq: Four times a day (QID) | INTRAMUSCULAR | Status: DC | PRN
  Administered 2021-07-21: 4 mg via INTRAVENOUS
  Filled 2021-07-21: qty 2

## 2021-07-21 NOTE — ED Triage Notes (Signed)
[facility] called out for possible stroke; however stroke screen negative by EMS and EDP upon arrival to ER-spouse reports some confusion

## 2021-07-21 NOTE — Progress Notes (Signed)
Freestyle Libre removed by patient prior to CT Head. RN Lyn Henri notified. Patient given instructions on paper of how to replace device for free.

## 2021-07-21 NOTE — ED Notes (Signed)
Date and time results received: 07/21/21 1730  Test: Hgb Critical Value: 6.7  Name of Provider Notified: Alvino Chapel  Orders Received? Or Actions Taken?: n/a

## 2021-07-21 NOTE — ED Notes (Signed)
DR assessed pt prior to triage when ambulance arrived.

## 2021-07-21 NOTE — ED Notes (Signed)
Called [facility] for Sandostatin

## 2021-07-21 NOTE — ED Notes (Signed)
Report given to Cody, RN.

## 2021-07-21 NOTE — ED Triage Notes (Signed)
Pt arrived to ED via [facility] for c/o AMS. Pt attempted to call his Dr at [facility] and because he forgot the names of his medication and nurse said slurred speech. Pt was brought to ED. No slurred speech , and A&Ox4

## 2021-07-21 NOTE — ED Provider Notes (Signed)
[facility] EMERGENCY DEPARTMENT Provider Note   CSN: [id number] Arrival date & time: 07/21/21  1625     History Chief Complaint  Patient presents with   Altered Mental Status    [name] is a 60 y.o. male.  The history is provided by the patient.  Altered Mental Status Presenting symptoms: confusion   Associated symptoms: no abdominal pain and no rash   Patient brought in by EMS.  Reportedly called with worry for stroke.  Reportedly was talking on the phone with some people about potential liver transplant.  Reportedly was slurring the words.  May have had some confusion.  EMS ended up being called.  Patient states he did feel little confused and is having difficulty speaking.  States he is feeling better now.  Almost back to normal.  EMS stated there was no deficits upon arrival.  Has been doing well the last few days.  No headache.  No fevers.    Past Medical History:  Diagnosis Date   Anemia    Arthritis    CAD (coronary artery disease)    Multivessel disease status post CABG 08/2015   Cirrhosis ([place])    CKD (chronic kidney disease) stage 3, GFR 30-59 ml/min (HCC)    Diastolic CHF ([place])    Essential hypertension    Hyperlipidemia    Iron deficiency anemia 07/15/2021   OSA on CPAP    Polyclonal gammopathy    Thrombocytopenia (HCC) 2016   Type 2 diabetes mellitus ([place])     Patient Active Problem List   Diagnosis Date Noted   Chronic kidney disease 07/21/2021   Pure hypercholesterolemia 07/21/2021   Iron deficiency anemia 07/15/2021   Hyperkalemia 03/17/2021   Elevated troponin 03/17/2021   Back pain 03/17/2021   Hypertensive heart and kidney disease with heart failure and with chronic kidney disease stage III ([place]) 12/17/2020   Chronic diastolic heart failure ([place]) 12/17/2020   CKD stage 3 due to type 2 diabetes mellitus ([place]) 12/17/2020   Hyperlipidemia associated with type 2 diabetes mellitus ([place]) 12/17/2020   Anxiety 12/17/2020   Chronic non-seasonal  allergic rhinitis 12/17/2020   Hypotension 12/10/2020   Acute renal failure superimposed on stage 3b chronic kidney disease ([place]) 12/10/2020   Acute renal failure superimposed on stage 3b chronic kidney disease, unspecified acute renal failure type ([place]) 12/10/2020   AKI (acute kidney injury) ([place]) 12/09/2020   GERD (gastroesophageal reflux disease) 10/09/2020   Portal hypertension ([place]) 08/26/2020   Pre-transplant evaluation for liver transplant 08/26/2020   Atherosclerotic heart disease of native coronary artery without angina pectoris 07/17/2020   Unspecified cirrhosis of liver ([place]) 07/17/2020   Anasarca 07/17/2020   Heart failure, unspecified ([place]) 07/17/2020   Morbid obesity ([place]) 07/17/2020   Acute on chronic heart failure with preserved ejection fraction (HCC)    Liver failure ([place]) 06/20/2020   Esophageal varices ([place]) 01/09/2020   History of adenomatous polyp of colon 01/09/2020   Elevated AST (SGOT) 08/23/2019   Elevation of level of transaminase and lactic acid dehydrogenase (LDH) 08/23/2019   Diarrhea 07/17/2019   Hypersomnia, persistent 03/12/2019   Macrocytic anemia 01/18/2019   Other pancytopenia ([place]) 01/18/2019   Anemia 01/18/2019   Thrombocytopenia ([place]) 12/30/2018   Polyclonal gammopathy 12/30/2018   S/P total knee replacement, left 03/09/17 11/22/2017   Primary osteoarthritis of left knee 03/09/2017   History of nocturia 03/02/2017   OSA on CPAP 03/02/2016   Hypoxia 03/02/2016   Obstructive sleep apnea (adult) (pediatric) 03/02/2016   Cyst  of mediastinum 01/15/2016   S/P CABG x 4 08/29/2015   Obesity, Class III, BMI 40-49.9 (morbid obesity) ([place])    Hyperglycemia    Pain in the chest 08/23/2015   Non-ST elevation MI (NSTEMI) ([place]) 08/23/2015   Diabetes mellitus type 2, uncontrolled ([place]) 08/23/2015   Essential hypertension 08/23/2015   Hyperlipidemia 08/23/2015   Type 2 diabetes mellitus with hypoglycemia with coma ([place]) 08/23/2015   Myocardial infarction  ([place]) 08/23/2015   Type 2 diabetes mellitus with hyperglycemia ([place]) 2016   Class 2 obesity 2016   Hyponatremia 2016   Osteoarthritis, knee 05/24/2012   Knee pain 10/29/2011   Knee stiffness 10/29/2011   S/P right knee arthroscopy 10/26/2011   Medial meniscus, posterior horn derangement 09/07/2011   Lateral meniscus derangement 09/07/2011   OA (osteoarthritis) of knee 09/07/2011   Rotator cuff syndrome of left shoulder 08/19/2011   Right knee meniscal tear 08/19/2011    Past Surgical History:  Procedure Laterality Date   APPENDECTOMY     Biceps tendon surgery Right    BIOPSY  11/22/2019   Procedure: BIOPSY;  Surgeon: Daneil Dolin, MD;  Location: [facility];  Service: Endoscopy;;   CARDIAC CATHETERIZATION N/A 08/25/2015   Procedure: Left Heart Cath and Coronary Angiography;  Surgeon: Belva Crome, MD;  Location: [facility];  Service: Cardiovascular;  Laterality: N/A;   COLONOSCOPY WITH PROPOFOL N/A 11/22/2019   Procedure: COLONOSCOPY WITH PROPOFOL;  Surgeon: Daneil Dolin, MD; Four 4-5 mm polyps, findings suggestive of portal colopathy, congested appearing colonic mucosa diffusely, rectal varices, and adequate right colon prep.  Pathology with tubular adenomas and hyperplastic polyp.  Right colon biopsy with focal active colitis.  Recommendations to repeat colonoscopy in 3 months due to poor prep.   COLONOSCOPY WITH PROPOFOL N/A 03/17/2020   Procedure: COLONOSCOPY WITH PROPOFOL;  Surgeon: Daneil Dolin, MD;  Location: [facility];  Service: Endoscopy;  Laterality: N/A;  8:45am - pt does not need covid test, was + 2/4 <90 days   CORONARY ARTERY BYPASS GRAFT N/A 08/29/2015   Procedure: CORONARY ARTERY BYPASS GRAFTING (CABG);  Surgeon: [name], MD;  Location: [facility];  Service: Open Heart Surgery;  Laterality: N/A;   ESOPHAGOGASTRODUODENOSCOPY (EGD) WITH PROPOFOL N/A 11/22/2019   Procedure: ESOPHAGOGASTRODUODENOSCOPY (EGD) WITH PROPOFOL;  Surgeon: Daneil Dolin, MD; 4 columns of grade 2-3 esophageal varices, portal gastropathy, gastric polyp/abnormal gastric mucosa s/p biopsy.  Pathology with hyperplastic polyp, mild chronic gastritis, negative H. pylori.   KNEE ARTHROSCOPY Left    TEE WITHOUT CARDIOVERSION N/A 08/29/2015   Procedure: TRANSESOPHAGEAL ECHOCARDIOGRAM (TEE);  Surgeon: [name], MD;  Location: [facility];  Service: Open Heart Surgery;  Laterality: N/A;   TOTAL KNEE ARTHROPLASTY Left 03/09/2017   Procedure: TOTAL KNEE ARTHROPLASTY;  Surgeon: Carole Civil, MD;  Location: [facility];  Service: Orthopedics;  Laterality: Left;       Family History  Problem Relation Age of Onset   Arthritis Other    Cancer Other    Diabetes Other    CAD Father    Diabetes Mellitus II Father    Liver cancer Father    Hodgkin's lymphoma Father    CAD Brother    Diabetes Mellitus II Brother    ALS Mother    Diabetes Mellitus II Sister    Diabetes Mellitus II Brother    Diabetes Mellitus II Maternal Grandmother    Aneurysm Maternal Grandmother    Cancer Maternal Grandfather  Anesthesia problems Neg Hx    Hypotension Neg Hx    Malignant hyperthermia Neg Hx    Pseudochol deficiency Neg Hx    Colon cancer Neg Hx     Social History   Tobacco Use   Smoking status: Never   Smokeless tobacco: Never  Vaping Use   Vaping Use: Never used  Substance Use Topics   Alcohol use: Not Currently   Drug use: No    Home Medications Prior to Admission medications   Medication Sig Start Date End Date Taking? Authorizing Provider  acetaminophen (TYLENOL) 325 MG tablet Take 650 mg by mouth every 6 (six) hours as needed for mild pain. 12/19/20  Yes [provider]  albuterol (VENTOLIN HFA) 108 (90 Base) MCG/ACT inhaler Inhale 2 puffs into the lungs every 6 (six) hours as needed for wheezing or shortness of breath. 12/22/20  Yes Gerlene Fee, NP  ALPRAZolam Duanne Moron) 0.5 MG tablet Take 1 tablet (0.5 mg total) by mouth daily as needed. 12/22/20   Yes Gerlene Fee, NP  Armodafinil 200 MG TABS Take 200 mg by mouth daily as needed (for sleepiness). 12/22/20  Yes Gerlene Fee, NP  aspirin EC 81 MG tablet Take 81 mg by mouth at bedtime.    Yes [provider]  Cholecalciferol (DIALYVITE VITAMIN D 5000) 125 MCG (5000 UT) capsule Take 5,000 Units by mouth in the morning.    Yes [provider]  ferrous sulfate 325 (65 FE) MG tablet Take 325 mg by mouth in the morning.    Yes [provider]  Flaxseed, Linseed, (FLAXSEED OIL) 1000 MG CAPS Take 1,000 mg by mouth at bedtime. 12/16/20  Yes [provider]  hydrochlorothiazide (HYDRODIURIL) 25 MG tablet Take 0.5 tablets by mouth daily. 03/22/21  Yes [provider]  insulin aspart (NOVOLOG) 100 UNIT/ML injection Inject 5 Units into the skin 3 (three) times daily before meals. Special Instructions: If accu-check is greater than 150. Hold for accu-check 150 or below. With Meals 12/22/20  Yes Gerlene Fee, NP  insulin degludec (TRESIBA FLEXTOUCH) 200 UNIT/ML FlexTouch Pen Inject 40 Units into the skin in the morning. Give 30 units Subcutaneous  at Bedtime 8:00 pm 12/22/20  Yes Gerlene Fee, NP  lactulose (CHRONULAC) 10 GM/15ML solution Take 30 mLs (20 g total) by mouth daily. 12/22/20  Yes Gerlene Fee, NP  lidocaine (XYLOCAINE) 2 % solution 5 mLs as needed. 06/18/21  Yes [provider]  loratadine (CLARITIN) 10 MG tablet Take 10 mg by mouth in the morning.    Yes [provider]  Magnesium 250 MG TABS Take 250 mg by mouth daily.   Yes [provider]  midodrine (PROAMATINE) 10 MG tablet Take 10 mg by mouth 3 (three) times daily.   Yes [provider]  Misc Natural Products (ELDERBERRY ZINC/VIT C/IMMUNE) LOZG Take 1 lozenge by mouth at bedtime. 12/16/20  Yes [provider]  nadolol (CORGARD) 20 MG tablet Take by mouth daily. 12/18/20  Yes [provider]  omeprazole (PRILOSEC) 40 MG capsule Take 1  capsule (40 mg total) by mouth at bedtime. Patient taking differently: Take 40 mg by mouth 2 (two) times daily. 12/22/20  Yes Gerlene Fee, NP  OZEMPIC, 1 MG/DOSE, 4 MG/3ML SOPN Inject 1 mg into the skin once a week. 12/22/20  Yes Gerlene Fee, NP  potassium chloride SA (KLOR-CON) 20 MEQ tablet Take 20 mEq by mouth 2 (two) times daily. 12/31/20  Yes [provider]  pravastatin (PRAVACHOL) 20 MG tablet TAKE 1 TABLET(20 MG) BY MOUTH AT BEDTIME 12/22/20  Yes Gerlene Fee, NP  torsemide (DEMADEX) 20 MG tablet Take 40 mg by mouth 2 (two) times daily. 03/25/21  Yes [provider]  TURMERIC CURCUMIN PO Take 2,000 mg by mouth at bedtime.    Yes [provider]  venlafaxine XR (EFFEXOR-XR) 37.5 MG 24 hr capsule Take 37.5 mg by mouth daily. 04/01/21  Yes [provider]  budesonide (PULMICORT) 180 MCG/ACT inhaler Inhale 1 puff into the lungs daily. Patient not taking: Reported on 07/21/2021    [provider]  Continuous Blood Gluc Receiver (FREESTYLE LIBRE 14 DAY READER) DEVI 1 reader 12/22/20   Gerlene Fee, NP  Continuous Blood Gluc Sensor (FREESTYLE LIBRE 14 DAY SENSOR) MISC 1 sensor 12/28/17   [provider]  Elastic Bandages & Supports ([place]) [place] 1 each by Does not apply route as directed. Low pressure knee high compression stockings Dx: leg swelling 05/13/20   Satira Sark, MD  INS SYRINGE/NEEDLE .5CC/28G 28G X 1/2" 0.5 ML MISC Inject 1 Syringe into the skin See admin instructions. 12/22/20   Gerlene Fee, NP  lidocaine (LIDODERM) 5 % Place 1 patch onto the skin daily. Remove & Discard patch within 12 hours or as directed by MD Patient not taking: No sig reported 03/25/21   Heath Lark D, DO    Allergies    Fluticasone  Review of Systems   Review of Systems  Constitutional:  Negative for appetite change.  Respiratory:  Negative for shortness of breath.   Cardiovascular:  Negative for chest pain.   Gastrointestinal:  Negative for abdominal pain.  Genitourinary:  Negative for flank pain.  Musculoskeletal:  Negative for back pain.  Skin:  Negative for rash.  Neurological:  Positive for speech difficulty.  Psychiatric/Behavioral:  Positive for confusion.    Physical Exam Updated Vital Signs BP 119/63   Pulse 83   Temp 98 F (36.7 C) (Oral)   Resp (!) 21   Ht 5\' 10"  (1.778 m)   Wt 120.2 kg   SpO2 100%   BMI 38.02 kg/m   Physical Exam Vitals and nursing note reviewed.  HENT:     Head: Atraumatic.  Eyes:     Extraocular Movements: Extraocular movements intact.     Pupils: Pupils are equal, round, and reactive to light.  Cardiovascular:     Rate and Rhythm: Regular rhythm.  Pulmonary:     Breath sounds: No wheezing or rhonchi.  Abdominal:     Tenderness: There is no abdominal tenderness.  Musculoskeletal:        General: No tenderness.     Cervical back: Neck supple.     Right lower leg: Edema present.     Left lower leg: Edema present.  Skin:    General: Skin is warm.     Capillary Refill: Capillary refill takes less than 2 seconds.     Coloration: Skin is not jaundiced.  Neurological:     Mental Status: He is alert and oriented to person, place, and time.     Comments: Awake and pleasant.  Normal speech.  Moving all extremities.  No slurring of words.  Awake and appropriate    ED Results / Procedures / Treatments   Labs (all labs ordered are listed, but only abnormal results are displayed) Labs Reviewed  CBC WITH DIFFERENTIAL/PLATELET - Abnormal; Notable for the following components:  Result Value   RBC 2.59 (*)    Hemoglobin 6.7 (*)    HCT 23.8 (*)    MCH 25.9 (*)    MCHC 28.2 (*)    RDW 16.9 (*)    Platelets 95 (*)    nRBC 0.3 (*)    All other components within normal limits  COMPREHENSIVE METABOLIC PANEL - Abnormal; Notable for the following components:   Sodium 132 (*)    Potassium 3.4 (*)    Glucose, Bld 297 (*)    BUN 26 (*)     Creatinine, Ser 1.29 (*)    Calcium 8.1 (*)    Total Protein 6.4 (*)    Albumin 3.0 (*)    Total Bilirubin 1.3 (*)    All other components within normal limits  AMMONIA - Abnormal; Notable for the following components:   Ammonia 68 (*)    All other components within normal limits  URINALYSIS, ROUTINE W REFLEX MICROSCOPIC - Abnormal; Notable for the following components:   APPearance HAZY (*)    Glucose, UA 150 (*)    Hgb urine dipstick MODERATE (*)    Ketones, ur 5 (*)    Leukocytes,Ua LARGE (*)    Bacteria, UA RARE (*)    All other components within normal limits  PROTIME-INR  POC OCCULT BLOOD, ED  PREPARE RBC (CROSSMATCH)  TYPE AND SCREEN    EKG None  Radiology CT HEAD WO CONTRAST (5MM)  Result Date: 07/21/2021 CLINICAL DATA:  Slurred speech. Concern for cerebrovascular accident EXAM: CT HEAD WITHOUT CONTRAST TECHNIQUE: Contiguous axial images were obtained from the base of the skull through the vertex without intravenous contrast. COMPARISON:  07/08/2016 FINDINGS: Brain: No acute intracranial hemorrhage. No focal mass lesion. No CT evidence of acute infarction. No midline shift or mass effect. No hydrocephalus. Basilar cisterns are patent. Vascular: No hyperdense vessel or unexpected calcification. Skull: Normal. Negative for fracture or focal lesion. Sinuses/Orbits: Mucosal thickening in the RIGHT maxillary sinus. Prior ethmoidectomy. Other: None IMPRESSION: 1. No acute intracranial findings. 2. No CT evidence of cortical infarction. 3. Chronic mucosal thickening in the RIGHT maxillary sinus. Electronically Signed   By: Suzy Bouchard M.D.   On: 07/21/2021 17:49    Procedures Procedures   Medications Ordered in ED Medications  hydrocortisone sodium succinate (SOLU-CORTEF) 100 MG injection 100 mg (has no administration in time range)  cefTRIAXone (ROCEPHIN) 1 g in sodium chloride 0.9 % 100 mL IVPB (has no administration in time range)  0.9 %  sodium chloride infusion (has no  administration in time range)  octreotide (SANDOSTATIN) 2 mcg/mL load via infusion 50 mcg (has no administration in time range)    And  octreotide (SANDOSTATIN) 500 mcg in sodium chloride 0.9 % 250 mL (2 mcg/mL) infusion (has no administration in time range)  pantoprazole (PROTONIX) 80 mg /NS 100 mL IVPB (has no administration in time range)  pantoprozole (PROTONIX) 80 mg /NS 100 mL infusion (has no administration in time range)  pantoprazole (PROTONIX) injection 40 mg (has no administration in time range)    ED Course  I have reviewed the triage vital signs and the nursing notes.  Pertinent labs & imaging results that were available during my care of the patient were reviewed by me and considered in my medical decision making (see chart for details).    MDM Rules/Calculators/A&P  Patient presents with episodic confusion.  Reportedly had some difficulty speaking with slurred speech and some confusion.  History of cirrhosis.  Ammonia is elevated.  However also has anemia.  Hemoglobin recently was 8 and now is 6.7.  Patient is guaiac positive.  Brown stool no gross blood.  Known cirrhosis with varices.  Also has a history of hypotension.  Had been on hydrocortisone and midodrine.  Stress dose steroid given here.  Sees [facility].  Head CT reassuring.  With the bleeding given Rocephin.  Discussed with Dr. Jenetta Downer.  Also will start octreotide and PPI drip.  Plan for EGD tomorrow as long as patient remains stable.  Target hemoglobin of around 8.  CRITICAL CARE Performed by: Davonna Belling Total critical care time: 30 minutes Critical care time was exclusive of separately billable procedures and treating other patients. Critical care was necessary to treat or prevent imminent or life-threatening deterioration. Critical care was time spent personally by me on the following activities: development of treatment plan with patient and/or surrogate as well as nursing,  discussions with consultants, evaluation of patient's response to treatment, examination of patient, obtaining history from patient or surrogate, ordering and performing treatments and interventions, ordering and review of laboratory studies, ordering and review of radiographic studies, pulse oximetry and re-evaluation of patient's condition.  Final Clinical Impression(s) / ED Diagnoses Final diagnoses:  Gastrointestinal hemorrhage, unspecified gastrointestinal hemorrhage type  Cirrhosis of liver without ascites, unspecified hepatic cirrhosis type [facility])    Rx / DC Orders ED Discharge Orders     None        Davonna Belling, MD 07/21/21 2115

## 2021-07-22 ENCOUNTER — Encounter (HOSPITAL_COMMUNITY): Payer: Self-pay | Admitting: Family Medicine

## 2021-07-22 DIAGNOSIS — E1165 Type 2 diabetes mellitus with hyperglycemia: Secondary | ICD-10-CM | POA: Diagnosis not present

## 2021-07-22 DIAGNOSIS — D649 Anemia, unspecified: Secondary | ICD-10-CM | POA: Diagnosis not present

## 2021-07-22 DIAGNOSIS — N1832 Chronic kidney disease, stage 3b: Secondary | ICD-10-CM

## 2021-07-22 DIAGNOSIS — K922 Gastrointestinal hemorrhage, unspecified: Secondary | ICD-10-CM | POA: Diagnosis not present

## 2021-07-22 DIAGNOSIS — K7460 Unspecified cirrhosis of liver: Secondary | ICD-10-CM | POA: Diagnosis not present

## 2021-07-22 DIAGNOSIS — N179 Acute kidney failure, unspecified: Secondary | ICD-10-CM

## 2021-07-22 DIAGNOSIS — G9341 Metabolic encephalopathy: Secondary | ICD-10-CM

## 2021-07-22 DIAGNOSIS — I5033 Acute on chronic diastolic (congestive) heart failure: Secondary | ICD-10-CM | POA: Diagnosis not present

## 2021-07-22 LAB — PROTIME-INR
INR: 1.3 — ABNORMAL HIGH (ref 0.8–1.2)
Prothrombin Time: 16.3 seconds — ABNORMAL HIGH (ref 11.4–15.2)

## 2021-07-22 LAB — GLUCOSE, CAPILLARY
Glucose-Capillary: 107 mg/dL — ABNORMAL HIGH (ref 70–99)
Glucose-Capillary: 126 mg/dL — ABNORMAL HIGH (ref 70–99)
Glucose-Capillary: 131 mg/dL — ABNORMAL HIGH (ref 70–99)
Glucose-Capillary: 155 mg/dL — ABNORMAL HIGH (ref 70–99)
Glucose-Capillary: 191 mg/dL — ABNORMAL HIGH (ref 70–99)

## 2021-07-22 LAB — MRSA NEXT GEN BY PCR, NASAL: MRSA by PCR Next Gen: NOT DETECTED

## 2021-07-22 LAB — CBC
HCT: 25.2 % — ABNORMAL LOW (ref 39.0–52.0)
Hemoglobin: 7.1 g/dL — ABNORMAL LOW (ref 13.0–17.0)
MCH: 26.1 pg (ref 26.0–34.0)
MCHC: 28.2 g/dL — ABNORMAL LOW (ref 30.0–36.0)
MCV: 92.6 fL (ref 80.0–100.0)
Platelets: 78 10*3/uL — ABNORMAL LOW (ref 150–400)
RBC: 2.72 MIL/uL — ABNORMAL LOW (ref 4.22–5.81)
RDW: 16.4 % — ABNORMAL HIGH (ref 11.5–15.5)
WBC: 4.6 10*3/uL (ref 4.0–10.5)
nRBC: 0 % (ref 0.0–0.2)

## 2021-07-22 LAB — COMPREHENSIVE METABOLIC PANEL
ALT: 13 U/L (ref 0–44)
AST: 25 U/L (ref 15–41)
Albumin: 2.7 g/dL — ABNORMAL LOW (ref 3.5–5.0)
Alkaline Phosphatase: 56 U/L (ref 38–126)
Anion gap: 6 (ref 5–15)
BUN: 22 mg/dL (ref 8–23)
CO2: 27 mmol/L (ref 22–32)
Calcium: 8.1 mg/dL — ABNORMAL LOW (ref 8.9–10.3)
Chloride: 102 mmol/L (ref 98–111)
Creatinine, Ser: 1.24 mg/dL (ref 0.61–1.24)
GFR, Estimated: >60 mL/min (ref >60)
Glucose, Bld: 149 mg/dL — ABNORMAL HIGH (ref 70–99)
Potassium: 4.3 mmol/L (ref 3.5–5.1)
Sodium: 135 mmol/L (ref 135–145)
Total Bilirubin: 1.6 mg/dL — ABNORMAL HIGH (ref 0.3–1.2)
Total Protein: 5.8 g/dL — ABNORMAL LOW (ref 6.5–8.1)

## 2021-07-22 LAB — HEMOGLOBIN A1C
Hgb A1c MFr Bld: 6.7 % — ABNORMAL HIGH (ref 4.8–5.6)
Mean Plasma Glucose: 145.59 mg/dL

## 2021-07-22 LAB — HIV ANTIBODY (ROUTINE TESTING W REFLEX): HIV Screen 4th Generation wRfx: NONREACTIVE

## 2021-07-22 LAB — MAGNESIUM: Magnesium: 2.2 mg/dL (ref 1.7–2.4)

## 2021-07-22 MED ORDER — POTASSIUM CHLORIDE 10 MEQ/100ML IV SOLN
10.0000 meq | INTRAVENOUS | Status: AC
  Administered 2021-07-22 (×2): 10 meq via INTRAVENOUS
  Filled 2021-07-22 (×2): qty 100

## 2021-07-22 MED ORDER — CHLORHEXIDINE GLUCONATE CLOTH 2 % EX PADS
6.0000 | MEDICATED_PAD | Freq: Every day | CUTANEOUS | Status: DC
  Administered 2021-07-22 – 2021-07-25 (×4): 6 via TOPICAL

## 2021-07-22 MED ORDER — HYDROCORTISONE NA SUCCINATE PF 100 MG IJ SOLR
100.0000 mg | Freq: Two times a day (BID) | INTRAMUSCULAR | Status: DC
  Administered 2021-07-22 – 2021-07-25 (×7): 100 mg via INTRAVENOUS
  Filled 2021-07-22 (×7): qty 2

## 2021-07-22 NOTE — H&P (Signed)
TRH H&P    Patient Demographics:    [name], is a 60 y.o. male  MRN: [id number]  DOB - [birth year]  Admit Date - 07/21/2021  Referring MD/NP/PA: Alvino Chapel  Outpatient Primary MD for the patient is [name], MD  Patient coming from: home  Chief complaint- altered mental status   HPI:    [name]  is a 60 y.o. male, with history of anemia, coronary artery disease, cirrhosis currently undergoing evaluation to be on transplant list, CKD, diastolic CHF, essential hypertension, hyperlipidemia, type 2 diabetes mellitus, and more presents the ED with a chief complaint of altered mental status.  Patient reports he is not sure why is in the ED and to to ask his wife at bedside.  Wife is very vague history.  She reports that patient has been confused but got worse at 10 AM today.  She says that he called her and he was not giving appropriate answers to questions.  For example she asked to have what he had for breakfast, he responded I love you to.  She reports that she was worried about him being dehydrated because he had some chest congestion and was started on a 10-day course of antibiotics-which she finished.  He also started on Mucinex, and he takes fluid pill.  She thought the combination of these medications may have dehydrated the patient.  She reports last night he was agitated and uncomfortable.  She reports he is never short tempered and he never uses profanity.  Last night he was very short tempered and also profane.  She reports has been complaining of shortness of breath and taking albuterol.  She does not report any visualized blood in the stool.  He has been following with [facility] for possible liver transplant.  He is also had 9 varices banded over the last 2 months.  He was supposed to get another EGD for follow-up in 5 days.  She reports at baseline he is sharp, good memory.  She reports that he  has never had hepatic encephalopathy in the past.  She does report that he takes his lactulose.  She had no other history to give at this time.  Patient reports he has nothing to add at this time either.  Patient does not smoke, do not drink, does not use illicit drugs.  He is vaccinated for COVID.  He is full code.  In the ED Temp 98, heart rate 70-87, respiratory 16-22, blood pressure 102/54, satting 100% No leukocytosis with white blood cell count 6.9, hemoglobin 6.7 Chemistry panel reveals a slight hyponatremia 132, slight hypokalemia, elevated BUN at 26, elevated creatinine 1.29 Albumin 3.0 Ammonia 68 UA borderline for UTI, urine culture pending CT head with no acute findings EKG shows sinus rhythm, QTC 4 and 69, rate 85 Solu-Cortef given in the ED for adrenal insufficiency Admission requested for further work-up of acute metabolic encephalopathy, GI bleed    Review of systems:    In addition to the HPI above,  No Fever-chills, No Headache, No changes with Vision or hearing,  No problems swallowing food or Liquids, No Chest pain, Cough admits to dyspnea No Abdominal pain, No Nausea or Vomiting, bowel movements are regular, No Blood in stool or Urine, No dysuria, No new skin rashes or bruises, No new joints pains-aches,  No new weakness, tingling, numbness in any extremity, No recent weight gain or loss, No polyuria, polydypsia or polyphagia, No significant Mental Stressors.  All other systems reviewed and are negative. Accuracy review of systems is questionable given patient's altered mental status.   Past History of the following :    Past Medical History:  Diagnosis Date   Anemia    Arthritis    CAD (coronary artery disease)    Multivessel disease status post CABG 08/2015   Cirrhosis (HCC)    CKD (chronic kidney disease) stage 3, GFR 30-59 ml/min (HCC)    Diastolic CHF (HCC)    Essential hypertension    Hyperlipidemia    Iron deficiency anemia 07/15/2021   OSA  on CPAP    Polyclonal gammopathy    Thrombocytopenia (HCC) 2016   Type 2 diabetes mellitus ([place])       Past Surgical History:  Procedure Laterality Date   APPENDECTOMY     Biceps tendon surgery Right    BIOPSY  11/22/2019   Procedure: BIOPSY;  Surgeon: Daneil Dolin, MD;  [facility];  Service: Endoscopy;;   CARDIAC CATHETERIZATION N/A 08/25/2015   Procedure: Left Heart Cath and Coronary Angiography;  Surgeon: Belva Crome, MD;  Location: [place] [facility];  Service: Cardiovascular;  Laterality: N/A;   COLONOSCOPY WITH PROPOFOL N/A 11/22/2019   Procedure: COLONOSCOPY WITH PROPOFOL;  Surgeon: Daneil Dolin, MD; Four 4-5 mm polyps, findings suggestive of portal colopathy, congested appearing colonic mucosa diffusely, rectal varices, and adequate right colon prep.  Pathology with tubular adenomas and hyperplastic polyp.  Right colon biopsy with focal active colitis.  Recommendations to repeat colonoscopy in 3 months due to poor prep.   COLONOSCOPY WITH PROPOFOL N/A 03/17/2020   Procedure: COLONOSCOPY WITH PROPOFOL;  Surgeon: Daneil Dolin, MD;  [facility];  Service: Endoscopy;  Laterality: N/A;  8:45am - pt does not need covid test, was + 2/4 <90 days   CORONARY ARTERY BYPASS GRAFT N/A 08/29/2015   Procedure: CORONARY ARTERY BYPASS GRAFTING (CABG);  Surgeon: [name], MD;  Location: [facility];  Service: Open Heart Surgery;  Laterality: N/A;   ESOPHAGOGASTRODUODENOSCOPY (EGD) WITH PROPOFOL N/A 11/22/2019   Procedure: ESOPHAGOGASTRODUODENOSCOPY (EGD) WITH PROPOFOL;  Surgeon: Daneil Dolin, MD; 4 columns of grade 2-3 esophageal varices, portal gastropathy, gastric polyp/abnormal gastric mucosa s/p biopsy.  Pathology with hyperplastic polyp, mild chronic gastritis, negative H. pylori.   KNEE ARTHROSCOPY Left    TEE WITHOUT CARDIOVERSION N/A 08/29/2015   Procedure: TRANSESOPHAGEAL ECHOCARDIOGRAM (TEE);  Surgeon: [name], MD;  Location: [facility];   Service: Open Heart Surgery;  Laterality: N/A;   TOTAL KNEE ARTHROPLASTY Left 03/09/2017   Procedure: TOTAL KNEE ARTHROPLASTY;  Surgeon: Carole Civil, MD;  Location: [facility];  Service: Orthopedics;  Laterality: Left;      Social History:      Social History   Tobacco Use   Smoking status: Never   Smokeless tobacco: Never  Substance Use Topics   Alcohol use: Not Currently       Family History :     Family History  Problem Relation Age of Onset   Arthritis Other    Cancer Other  Diabetes Other    CAD Father    Diabetes Mellitus II Father    Liver cancer Father    Hodgkin's lymphoma Father    CAD Brother    Diabetes Mellitus II Brother    ALS Mother    Diabetes Mellitus II Sister    Diabetes Mellitus II Brother    Diabetes Mellitus II Maternal Grandmother    Aneurysm Maternal Grandmother    Cancer Maternal Grandfather    Anesthesia problems Neg Hx    Hypotension Neg Hx    Malignant hyperthermia Neg Hx    Pseudochol deficiency Neg Hx    Colon cancer Neg Hx       Home Medications:   Prior to Admission medications   Medication Sig Start Date End Date Taking? Authorizing Provider  acetaminophen (TYLENOL) 325 MG tablet Take 650 mg by mouth every 6 (six) hours as needed for mild pain. 12/19/20  Yes [provider]  albuterol (VENTOLIN HFA) 108 (90 Base) MCG/ACT inhaler Inhale 2 puffs into the lungs every 6 (six) hours as needed for wheezing or shortness of breath. 12/22/20  Yes Gerlene Fee, NP  ALPRAZolam Duanne Moron) 0.5 MG tablet Take 1 tablet (0.5 mg total) by mouth daily as needed. 12/22/20  Yes Gerlene Fee, NP  Armodafinil 200 MG TABS Take 200 mg by mouth daily as needed (for sleepiness). 12/22/20  Yes Gerlene Fee, NP  aspirin EC 81 MG tablet Take 81 mg by mouth at bedtime.    Yes [provider]  Cholecalciferol (DIALYVITE VITAMIN D 5000) 125 MCG (5000 UT) capsule Take 5,000 Units by mouth in the morning.    Yes [provider]  ferrous sulfate 325 (65 FE) MG tablet Take 325 mg by mouth in the morning.    Yes [provider]  Flaxseed, Linseed, (FLAXSEED OIL) 1000 MG CAPS Take 1,000 mg by mouth at bedtime. 12/16/20  Yes [provider]  hydrochlorothiazide (HYDRODIURIL) 25 MG tablet Take 0.5 tablets by mouth daily. 03/22/21  Yes [provider]  insulin aspart (NOVOLOG) 100 UNIT/ML injection Inject 5 Units into the skin 3 (three) times daily before meals. Special Instructions: If accu-check is greater than 150. Hold for accu-check 150 or below. With Meals 12/22/20  Yes Gerlene Fee, NP  insulin degludec (TRESIBA FLEXTOUCH) 200 UNIT/ML FlexTouch Pen Inject 40 Units into the skin in the morning. Give 30 units Subcutaneous  at Bedtime 8:00 pm 12/22/20  Yes Gerlene Fee, NP  lactulose (CHRONULAC) 10 GM/15ML solution Take 30 mLs (20 g total) by mouth daily. 12/22/20  Yes Gerlene Fee, NP  lidocaine (XYLOCAINE) 2 % solution 5 mLs as needed. 06/18/21  Yes [provider]  loratadine (CLARITIN) 10 MG tablet Take 10 mg by mouth in the morning.    Yes [provider]  Magnesium 250 MG TABS Take 250 mg by mouth daily.   Yes [provider]  midodrine (PROAMATINE) 10 MG tablet Take 10 mg by mouth 3 (three) times daily.   Yes [provider]  Misc Natural Products (ELDERBERRY ZINC/VIT C/IMMUNE) LOZG Take 1 lozenge by mouth at bedtime. 12/16/20  Yes [provider]  nadolol (CORGARD) 20 MG tablet Take by mouth daily. 12/18/20  Yes [provider]  omeprazole (PRILOSEC) 40 MG capsule Take 1 capsule (40 mg total) by mouth at bedtime. Patient taking differently: Take 40 mg by mouth 2 (two) times daily. 12/22/20  Yes Gerlene Fee, NP  OZEMPIC, 1 MG/DOSE,  4 MG/3ML SOPN Inject 1 mg into the skin once a week. 12/22/20  Yes Gerlene Fee, NP  potassium chloride SA (KLOR-CON) 20 MEQ tablet Take 20 mEq by mouth 2 (two) times daily. 12/31/20  Yes [provider]  pravastatin (PRAVACHOL) 20 MG tablet TAKE 1 TABLET(20 MG) BY MOUTH AT BEDTIME 12/22/20  Yes Gerlene Fee, NP  torsemide (DEMADEX) 20 MG tablet Take 40 mg by mouth 2 (two) times daily. 03/25/21  Yes [provider]  TURMERIC CURCUMIN PO Take 2,000 mg by mouth at bedtime.    Yes [provider]  venlafaxine XR (EFFEXOR-XR) 37.5 MG 24 hr capsule Take 37.5 mg by mouth daily. 04/01/21  Yes [provider]  budesonide (PULMICORT) 180 MCG/ACT inhaler Inhale 1 puff into the lungs daily. Patient not taking: Reported on 07/21/2021    [provider]  Continuous Blood Gluc Receiver (FREESTYLE LIBRE 14 DAY READER) DEVI 1 reader 12/22/20   Gerlene Fee, NP  Continuous Blood Gluc Sensor (FREESTYLE LIBRE 14 DAY SENSOR) MISC 1 sensor 12/28/17   [provider]  Elastic Bandages & Supports ([place]) [place] 1 each by Does not apply route as directed. Low pressure knee high compression stockings Dx: leg swelling 05/13/20   Satira Sark, MD  INS SYRINGE/NEEDLE .5CC/28G 28G X 1/2" 0.5 ML MISC Inject 1 Syringe into the skin See admin instructions. 12/22/20   Gerlene Fee, NP  lidocaine (LIDODERM) 5 % Place 1 patch onto the skin daily. Remove & Discard patch within 12 hours or as directed by MD Patient not taking: No sig reported 03/25/21   Heath Lark D, DO     Allergies:     Allergies  Allergen Reactions   Fluticasone Rash     Physical Exam:   Vitals  Blood pressure 107/65, pulse 71, temperature 97.9 F (36.6 C), temperature source Oral, resp. rate 13, height 5\' 10"  (1.778 m), weight 124.1 kg, SpO2 98 %.  1.  General: Patient lying supine in bed,  no acute distress   2. Psychiatric: Alert and oriented x 3, mood and behavior normal for situation, pleasant and cooperative with exam, some difficulty with memory as he is obviously not able to, recent memory is not intact with patient not being able to express what was  happening today   3. Neurologic: Speech and language are normal, face is symmetric, moves all 4 extremities voluntarily, confused  4. HEENMT:  Head is atraumatic, normocephalic, pupils reactive to light, neck is supple, trachea is midline, mucous membranes are moist   5. Respiratory : Lungs are clear to auscultation bilaterally without wheezing, rhonchi, rales, no cyanosis, no increase in work of breathing or accessory muscle use   6. Cardiovascular : Heart rate normal, rhythm is regular, no murmurs, rubs or gallops, significant peripheral edema up to proximal thigh   7. Gastrointestinal:  Abdomen is soft, nondistended, nontender to palpation bowel sounds active, no masses or organomegaly palpated   8. Skin:  Skin is warm, dry and intact without rashes, acute lesions, or ulcers on limited exam   9.Musculoskeletal:  No acute deformities or trauma, no asymmetry in tone, significant peripheral edema, peripheral pulses difficult to palpate secondary to edema, no tenderness to palpation in the extremities    Data Review:    CBC Recent Labs  Lab 07/21/21 1644  WBC 6.9  HGB 6.7*  HCT 23.8*  PLT 95*  MCV 91.9  MCH 25.9*  MCHC 28.2*  RDW 16.9*  LYMPHSABS 1.1  MONOABS 0.6  EOSABS 0.1  BASOSABS 0.0   ------------------------------------------------------------------------------------------------------------------  Results for orders placed or performed during the hospital encounter of 07/21/21 (from the past 48 hour(s))  Protime-INR     Status: None   Collection Time: 07/21/21  4:44 PM  Result Value Ref Range   Prothrombin Time 15.0 11.4 - 15.2 seconds   INR 1.2 0.8 - 1.2    Comment: (NOTE) INR goal varies based on device and disease states. Performed at [facility], [address] [place], [place] [address]   CBC with Differential     Status: Abnormal   Collection Time: 07/21/21  4:44 PM  Result Value Ref Range   WBC 6.9 4.0 - 10.5 K/uL   RBC 2.59 (L) 4.22 - 5.81  MIL/uL   Hemoglobin 6.7 (LL) 13.0 - 17.0 g/dL    Comment: This critical result has verified and been called to [facility] by Westley Foots on 08 09 2022 at 1729, and has been read back.    HCT 23.8 (L) 39.0 - 52.0 %   MCV 91.9 80.0 - 100.0 fL   MCH 25.9 (L) 26.0 - 34.0 pg   MCHC 28.2 (L) 30.0 - 36.0 g/dL   RDW 16.9 (H) 11.5 - 15.5 %   Platelets 95 (L) 150 - 400 K/uL    Comment: SPECIMEN CHECKED FOR CLOTS Immature Platelet Fraction may be clinically indicated, consider ordering this additional test [id number] CONSISTENT WITH PREVIOUS RESULT REPEATED TO VERIFY    nRBC 0.3 (H) 0.0 - 0.2 %   Neutrophils Relative % 72 %   Neutro Abs 5.0 1.7 - 7.7 K/uL   Lymphocytes Relative 16 %   Lymphs Abs 1.1 0.7 - 4.0 K/uL   Monocytes Relative 9 %   Monocytes Absolute 0.6 0.1 - 1.0 K/uL   Eosinophils Relative 1 %   Eosinophils Absolute 0.1 0.0 - 0.5 K/uL   Basophils Relative 1 %   Basophils Absolute 0.0 0.0 - 0.1 K/uL   RBC Morphology MACROCYTES PRESENT    Immature Granulocytes 1 %   Abs Immature Granulocytes 0.05 0.00 - 0.07 K/uL    Comment: Performed at [facility], [address], [place], [place] [address]  Comprehensive metabolic panel     Status: Abnormal   Collection Time: 07/21/21  4:44 PM  Result Value Ref Range   Sodium 132 (L) 135 - 145 mmol/L   Potassium 3.4 (L) 3.5 - 5.1 mmol/L   Chloride 98 98 - 111 mmol/L   CO2 26 22 - 32 mmol/L   Glucose, Bld 297 (H) 70 - 99 mg/dL    Comment: Glucose reference range applies only to samples taken after fasting for at least 8 hours.   BUN 26 (H) 8 - 23 mg/dL   Creatinine, Ser 1.29 (H) 0.61 - 1.24 mg/dL   Calcium 8.1 (L) 8.9 - 10.3 mg/dL   Total Protein 6.4 (L) 6.5 - 8.1 g/dL   Albumin 3.0 (L) 3.5 - 5.0 g/dL   AST 26 15 - 41 U/L   ALT 14 0 - 44 U/L   Alkaline Phosphatase 70 38 - 126 U/L   Total Bilirubin 1.3 (H) 0.3 - 1.2 mg/dL   GFR, Estimated >60 >60 mL/min    Comment: (NOTE) Calculated using the CKD-EPI Creatinine Equation (2021)     Anion gap 8 5 - 15    Comment: Performed at [facility], [address], [place], [place] [address]  Ammonia     Status: Abnormal  Collection Time: 07/21/21  4:44 PM  Result Value Ref Range   Ammonia 68 (H) 9 - 35 umol/L    Comment: Performed at [facility], [address] [place], [place] [address]  Urinalysis, Routine w reflex microscopic Urine, Clean Catch     Status: Abnormal   Collection Time: 07/21/21  5:05 PM  Result Value Ref Range   Color, Urine YELLOW YELLOW   APPearance HAZY (A) CLEAR   Specific Gravity, Urine 1.018 1.005 - 1.030   pH 6.0 5.0 - 8.0   Glucose, UA 150 (A) NEGATIVE mg/dL   Hgb urine dipstick MODERATE (A) NEGATIVE   Bilirubin Urine NEGATIVE NEGATIVE   Ketones, ur 5 (A) NEGATIVE mg/dL   Protein, ur NEGATIVE NEGATIVE mg/dL   Nitrite NEGATIVE NEGATIVE   Leukocytes,Ua LARGE (A) NEGATIVE   RBC / HPF 11-20 0 - 5 RBC/hpf   WBC, UA 21-50 0 - 5 WBC/hpf   Bacteria, UA RARE (A) NONE SEEN   Squamous Epithelial / LPF 6-10 0 - 5   Mucus PRESENT    Budding Yeast PRESENT     Comment: Performed at [facility], [address] [place], [place] [address]  POC occult blood, ED     Status: Abnormal   Collection Time: 07/21/21  7:54 PM  Result Value Ref Range   Fecal Occult Bld POSITIVE (A) NEGATIVE  Prepare RBC (crossmatch)     Status: None   Collection Time: 07/21/21  8:29 PM  Result Value Ref Range   Order Confirmation      ORDER PROCESSED BY BLOOD BANK Performed at [facility], [address], [place], [place] [address]   Type and screen [facility]     Status: None (Preliminary result)   Collection Time: 07/21/21  8:55 PM  Result Value Ref Range   ABO/RH(D) A POS    Antibody Screen NEG    Sample Expiration 07/24/2021,2359    Unit Number [id number]    Blood Component Type RED CELLS,LR    Unit division 00    Status of Unit ISSUED    Transfusion Status OK TO TRANSFUSE    Crossmatch Result      Compatible Performed at [facility], [address], [place], [place] [address]   Resp Panel by RT-PCR (Flu A&B, Covid) Nasopharyngeal Swab     Status: None   Collection Time: 07/21/21  9:21 PM   Specimen: Nasopharyngeal Swab; Nasopharyngeal(NP) swabs in vial transport medium  Result Value Ref Range   SARS Coronavirus 2 by RT PCR NEGATIVE NEGATIVE    Comment: (NOTE) SARS-CoV-2 target nucleic acids are NOT DETECTED.  The SARS-CoV-2 RNA is generally detectable in upper respiratory specimens during the acute phase of infection. The lowest concentration of SARS-CoV-2 viral copies this assay can detect is 138 copies/mL. A negative result does not preclude SARS-Cov-2 infection and should not be used as the sole basis for treatment or other patient management decisions. A negative result may occur with  improper specimen collection/handling, submission of specimen other than nasopharyngeal swab, presence of viral mutation(s) within the areas targeted by this assay, and inadequate number of viral copies(<138 copies/mL). A negative result must be combined with clinical observations, patient history, and epidemiological information. The expected result is Negative.  Fact Sheet for Patients:  EntrepreneurPulse.com.au  Fact Sheet for Healthcare Providers:  IncredibleEmployment.be  This test is no t yet approved or cleared by the [place] FDA and  has been authorized for detection and/or diagnosis of SARS-CoV-2 by  FDA under an Emergency Use Authorization (EUA). This EUA will remain  in effect (meaning this test can be used) for the duration of the COVID-19 declaration under Section 564(b)(1) of the Act, 21 U.S.C.section 360bbb-3(b)(1), unless the authorization is terminated  or revoked sooner.       Influenza A by PCR NEGATIVE NEGATIVE   Influenza B by PCR NEGATIVE NEGATIVE    Comment: (NOTE) The Xpert Xpress SARS-CoV-2/FLU/RSV plus assay is intended as an aid in the diagnosis of influenza from  Nasopharyngeal swab specimens and should not be used as a sole basis for treatment. Nasal washings and aspirates are unacceptable for Xpert Xpress SARS-CoV-2/FLU/RSV testing.  Fact Sheet for Patients: EntrepreneurPulse.com.au  Fact Sheet for Healthcare Providers: IncredibleEmployment.be  This test is not yet approved or cleared by the [place] FDA and has been authorized for detection and/or diagnosis of SARS-CoV-2 by FDA under an Emergency Use Authorization (EUA). This EUA will remain in effect (meaning this test can be used) for the duration of the COVID-19 declaration under Section 564(b)(1) of the Act, 21 U.S.C. section 360bbb-3(b)(1), unless the authorization is terminated or revoked.  Performed at [facility], [address] [place], [place] [address]   CBG monitoring, ED     Status: Abnormal   Collection Time: 07/21/21 11:07 PM  Result Value Ref Range   Glucose-Capillary 177 (H) 70 - 99 mg/dL    Comment: Glucose reference range applies only to samples taken after fasting for at least 8 hours.  Glucose, capillary     Status: Abnormal   Collection Time: 07/22/21 12:33 AM  Result Value Ref Range   Glucose-Capillary 155 (H) 70 - 99 mg/dL    Comment: Glucose reference range applies only to samples taken after fasting for at least 8 hours.    Chemistries  Recent Labs  Lab 07/21/21 1644  NA 132*  K 3.4*  CL 98  CO2 26  GLUCOSE 297*  BUN 26*  CREATININE 1.29*  CALCIUM 8.1*  AST 26  ALT 14  ALKPHOS 70  BILITOT 1.3*   ------------------------------------------------------------------------------------------------------------------  ------------------------------------------------------------------------------------------------------------------ GFR: Estimated Creatinine Clearance: 79.4 mL/min (A) (by C-G formula based on SCr of 1.29 mg/dL (H)). Liver Function Tests: Recent Labs  Lab 07/21/21 1644  AST 26  ALT 14   ALKPHOS 70  BILITOT 1.3*  PROT 6.4*  ALBUMIN 3.0*   No results for input(s): LIPASE, AMYLASE in the last 168 hours. Recent Labs  Lab 07/21/21 1644  AMMONIA 68*   Coagulation Profile: Recent Labs  Lab 07/21/21 1644  INR 1.2   Cardiac Enzymes: No results for input(s): CKTOTAL, CKMB, CKMBINDEX, TROPONINI in the last 168 hours. BNP (last 3 results) No results for input(s): PROBNP in the last 8760 hours. HbA1C: No results for input(s): HGBA1C in the last 72 hours. CBG: Recent Labs  Lab 07/21/21 2307 07/22/21 0033  GLUCAP 177* 155*   Lipid Profile: No results for input(s): CHOL, HDL, LDLCALC, TRIG, CHOLHDL, LDLDIRECT in the last 72 hours. Thyroid Function Tests: No results for input(s): TSH, T4TOTAL, FREET4, T3FREE, THYROIDAB in the last 72 hours. Anemia Panel: No results for input(s): VITAMINB12, FOLATE, FERRITIN, TIBC, IRON, RETICCTPCT in the last 72 hours.  --------------------------------------------------------------------------------------------------------------- Urine analysis:    Component Value Date/Time   COLORURINE YELLOW 07/21/2021 1705   APPEARANCEUR HAZY (A) 07/21/2021 1705   LABSPEC 1.018 07/21/2021 1705   PHURINE 6.0 07/21/2021 1705   GLUCOSEU 150 (A) 07/21/2021 1705   HGBUR MODERATE (A) 07/21/2021 1705   BILIRUBINUR NEGATIVE 07/21/2021  [place] (A) 07/21/2021 1705   PROTEINUR NEGATIVE 07/21/2021 1705   UROBILINOGEN 1.0 08/28/2015 1634   NITRITE NEGATIVE 07/21/2021 1705   LEUKOCYTESUR LARGE (A) 07/21/2021 1705      Imaging Results:    CT HEAD WO CONTRAST (5MM)  Result Date: 07/21/2021 CLINICAL DATA:  Slurred speech. Concern for cerebrovascular accident EXAM: CT HEAD WITHOUT CONTRAST TECHNIQUE: Contiguous axial images were obtained from the base of the skull through the vertex without intravenous contrast. COMPARISON:  07/08/2016 FINDINGS: Brain: No acute intracranial hemorrhage. No focal mass lesion. No CT evidence of acute infarction.  No midline shift or mass effect. No hydrocephalus. Basilar cisterns are patent. Vascular: No hyperdense vessel or unexpected calcification. Skull: Normal. Negative for fracture or focal lesion. Sinuses/Orbits: Mucosal thickening in the RIGHT maxillary sinus. Prior ethmoidectomy. Other: None IMPRESSION: 1. No acute intracranial findings. 2. No CT evidence of cortical infarction. 3. Chronic mucosal thickening in the RIGHT maxillary sinus. Electronically Signed   By: Suzy Bouchard M.D.   On: 07/21/2021 17:49       Assessment & Plan:    Active Problems:   Diabetes mellitus type 2, uncontrolled (HCC)   Acute on chronic heart failure with preserved ejection fraction (HCC)   Acute renal failure superimposed on stage 3b chronic kidney disease (HCC)   Anemia   GI bleed   Acute metabolic encephalopathy   GI bleeding and symptomatic anemia With history of banded varices ED provider reports that GI is aware the patient has had banded varices in the last 2 months and states the patient is safe to stay here at [facility], hold aspirin Continue simvastatin and Rocephin Continue transfusion and recheck CBC in the a.m. Patient is at high risk of decompensation due to history of varices, admit to ICU N.p.o. Continue to monitor Acute metabolic encephalopathy Multifactorial Possible UTI, elevated ammonia, anemia all contributing Treat as below CT head shows no acute changes Elevated ammonia In setting of cirrhosis Continue lactulose Trend in the a.m. CKD stage III Renal function seems to be at baseline Continue to monitor Diabetes mellitus type 2 Currently n.p.o. Monitor CBG every 6 hours Sliding scale coverage Reduce dose of basal insulin Continue to monitor Possible UTI UA is borderline suspicious for UTI Urine culture pending Rocephin that has already been started for GI bleed will cover UTI Continue to monitor Adrenal insufficiency Solu-Cortef given in the  ED Continue Solu-Cortef Hypokalemia Replace and recheck  Hypertension Holding hypertensive medication secondary to soft pressures    DVT Prophylaxis-   SCDs   AM Labs Ordered, also please review Full Orders  Family Communication: Admission, patients condition and plan of care including tests being ordered have been discussed with the patient and wife who indicate understanding and agree with the plan and Code Status.  Code Status: Full  Admission status: Inpatient :The appropriate admission status for this patient is INPATIENT. Inpatient status is judged to be reasonable and necessary in order to provide the required intensity of service to ensure the patient's safety. The patient's presenting symptoms, physical exam findings, and initial radiographic and laboratory data in the context of their chronic comorbidities is felt to place them at high risk for further clinical deterioration. Furthermore, it is not anticipated that the patient will be medically stable for discharge from the hospital within 2 midnights of admission. The following factors support the admission status of inpatient.     The patient's presenting symptoms include altered mental status. The worrisome physical  exam findings include encephalopathic. The initial radiographic and laboratory data are worrisome because of elevated ammonia, hemoglobin 6.7, possible UTI.        * I certify that at the point of admission it is my clinical judgment that the patient will require inpatient hospital care spanning beyond 2 midnights from the point of admission due to high intensity of service, high risk for further deterioration and high frequency of surveillance required.*  Time spent in minutes : [place]

## 2021-07-22 NOTE — H&P (View-Only) (Signed)
Referring Provider: No ref. provider found Primary Care Physician:  [name], MD Primary Gastroenterologist:  Dr.Vance  Date of Admission: 07/21/21 Date of Consultation: 07/22/21  Reason for Consultation: GI bleed  HPI:  [name] is a 60 y.o. year old male with pmh of anemia, CAD, cirrhosis (currently under evalution to be on transplant list), CKD, diastolic CHF, HTN, HLD, Type II DM who presnted to the ED yesterday with c/o AMS. Patient unsure why he was in the ED, was accompanied by his wife who apparently was not able to provide much significant history either. States patient had been confused but it worsened around 10  am yesterday. States he called her and was not answering questions appropriately. He was recently started on 10 day course of antibiotics due to chest congestion and wife was concerned patient may be dehydrated. Wife reported patient is normally very even tempered, however, the night prior to ED admission, patient was short tempered and using profanity. No reports of any visualized blood in stools. He is following at [facility] for possible liver transplant and has had 9 esophageal varices banded over the past 2 months. Supposed to have follow up EGD on 8/15. At baseline patient is A/Ox4 with no reported memory issues. He has reportedly never had hepatic encephalopathy in the past and he is compliant with his lactulose.   ED Course: initially brought in by EMS with c/o possible stroke, reportedly slurring his words and confused, patient reported feeling confused and having some issues speaking but states he felt better on arrival to ED. vitals WNL, WBC 6.9, hgb 6.7, Na 132, creatnine 1.29, albumin 3, ammonia 68, CT head with no acute findings, solu-cortef given in ED for adrenal insufficiency, UA borderling for UTI with urine culture pending. FOBT positive, brown stool no BRBPR noted per ED notes, Rocephin started in ED for SBP prophylaxis as well as octreotide and PPI  drip.   Consult:  Patient a/o this morning during consult and is able to conversate appropriately. Reports that he was feeling confused which is what brought him to the ED. He denies any recent black stools or BRBPR. He denies any previous issues with confusion. He is unable to pinpoint when his confusion started but states according to what his wife told him, it was yesterday. He denies any abdominal pain, nausea, vomiting, constipation or diarrhea. No swelling to abdomen, but he has noticed some swelling to bilateral LEs. Reports he is taking his lactulose as prescribed and is having 6-7 BMs per day. Patient's wife reports that patient had multiple esophageal bandings recently and 8/15 was his follow up EGD for evaluation/possible banding.   Hgb 7.1 this morning, plt 78,000, INR 1.3, up from 1.2 yesterday, transaminases and alk phos WNL, total bili 1.6, trending up from 1.3 yesterday. Albumin 2.7. ammonia 68 yesterday.   Past Medical History:  Diagnosis Date   Anemia    Arthritis    CAD (coronary artery disease)    Multivessel disease status post CABG 08/2015   Cirrhosis ([name])    CKD (chronic kidney disease) stage 3, GFR 30-59 ml/min (HCC)    Diastolic CHF ([place])    Essential hypertension    Hyperlipidemia    Iron deficiency anemia 07/15/2021   OSA on CPAP    Polyclonal gammopathy    Thrombocytopenia ([place]) 2016   Type 2 diabetes mellitus ([place])     Past Surgical History:  Procedure Laterality Date   APPENDECTOMY     Biceps tendon surgery  Right    BIOPSY  11/22/2019   Procedure: BIOPSY;  Surgeon: Daneil Dolin, MD;  [facility];  Service: Endoscopy;;   CARDIAC CATHETERIZATION N/A 08/25/2015   Procedure: Left Heart Cath and Coronary Angiography;  Surgeon: Belva Crome, MD;  Location: [facility];  Service: Cardiovascular;  Laterality: N/A;   COLONOSCOPY WITH PROPOFOL N/A 11/22/2019   Procedure: COLONOSCOPY WITH PROPOFOL;  Surgeon: Daneil Dolin, MD; Four 4-5 mm  polyps, findings suggestive of portal colopathy, congested appearing colonic mucosa diffusely, rectal varices, and adequate right colon prep.  Pathology with tubular adenomas and hyperplastic polyp.  Right colon biopsy with focal active colitis.  Recommendations to repeat colonoscopy in 3 months due to poor prep.   COLONOSCOPY WITH PROPOFOL N/A 03/17/2020   Procedure: COLONOSCOPY WITH PROPOFOL;  Surgeon: Daneil Dolin, MD;  [facility];  Service: Endoscopy;  Laterality: N/A;  8:45am - pt does not need covid test, was + 2/4 <90 days   CORONARY ARTERY BYPASS GRAFT N/A 08/29/2015   Procedure: CORONARY ARTERY BYPASS GRAFTING (CABG);  Surgeon: [name], MD;  Location: [facility];  Service: Open Heart Surgery;  Laterality: N/A;   ESOPHAGOGASTRODUODENOSCOPY (EGD) WITH PROPOFOL N/A 11/22/2019   Procedure: ESOPHAGOGASTRODUODENOSCOPY (EGD) WITH PROPOFOL;  Surgeon: Daneil Dolin, MD; 4 columns of grade 2-3 esophageal varices, portal gastropathy, gastric polyp/abnormal gastric mucosa s/p biopsy.  Pathology with hyperplastic polyp, mild chronic gastritis, negative H. pylori.   KNEE ARTHROSCOPY Left    TEE WITHOUT CARDIOVERSION N/A 08/29/2015   Procedure: TRANSESOPHAGEAL ECHOCARDIOGRAM (TEE);  Surgeon: [name], MD;  Location: [facility];  Service: Open Heart Surgery;  Laterality: N/A;   TOTAL KNEE ARTHROPLASTY Left 03/09/2017   Procedure: TOTAL KNEE ARTHROPLASTY;  Surgeon: Carole Civil, MD;  Location: [facility];  Service: Orthopedics;  Laterality: Left;    Prior to Admission medications   Medication Sig Start Date End Date Taking? Authorizing Provider  acetaminophen (TYLENOL) 325 MG tablet Take 650 mg by mouth every 6 (six) hours as needed for mild pain. 12/19/20  Yes [provider]  albuterol (VENTOLIN HFA) 108 (90 Base) MCG/ACT inhaler Inhale 2 puffs into the lungs every 6 (six) hours as needed for wheezing or shortness of breath. 12/22/20  Yes Gerlene Fee, NP   ALPRAZolam Duanne Moron) 0.5 MG tablet Take 1 tablet (0.5 mg total) by mouth daily as needed. 12/22/20  Yes Gerlene Fee, NP  Armodafinil 200 MG TABS Take 200 mg by mouth daily as needed (for sleepiness). 12/22/20  Yes Gerlene Fee, NP  aspirin EC 81 MG tablet Take 81 mg by mouth at bedtime.    Yes [provider]  Cholecalciferol (DIALYVITE VITAMIN D 5000) 125 MCG (5000 UT) capsule Take 5,000 Units by mouth in the morning.    Yes [provider]  ferrous sulfate 325 (65 FE) MG tablet Take 325 mg by mouth in the morning.    Yes [provider]  Flaxseed, Linseed, (FLAXSEED OIL) 1000 MG CAPS Take 1,000 mg by mouth at bedtime. 12/16/20  Yes [provider]  hydrochlorothiazide (HYDRODIURIL) 25 MG tablet Take 0.5 tablets by mouth daily. 03/22/21  Yes [provider]  insulin aspart (NOVOLOG) 100 UNIT/ML injection Inject 5 Units into the skin 3 (three) times daily before meals. Special Instructions: If accu-check is greater than 150. Hold for accu-check 150 or below. With Meals 12/22/20  Yes Gerlene Fee, NP  insulin degludec (TRESIBA FLEXTOUCH) 200 UNIT/ML  FlexTouch Pen Inject 40 Units into the skin in the morning. Give 30 units Subcutaneous  at Bedtime 8:00 pm 12/22/20  Yes Gerlene Fee, NP  lactulose (CHRONULAC) 10 GM/15ML solution Take 30 mLs (20 g total) by mouth daily. 12/22/20  Yes Gerlene Fee, NP  lidocaine (XYLOCAINE) 2 % solution 5 mLs as needed. 06/18/21  Yes [provider]  loratadine (CLARITIN) 10 MG tablet Take 10 mg by mouth in the morning.    Yes [provider]  Magnesium 250 MG TABS Take 250 mg by mouth daily.   Yes [provider]  midodrine (PROAMATINE) 10 MG tablet Take 10 mg by mouth 3 (three) times daily.   Yes [provider]  Misc Natural Products (ELDERBERRY ZINC/VIT C/IMMUNE) LOZG Take 1 lozenge by mouth at bedtime. 12/16/20  Yes [provider]  nadolol (CORGARD) 20 MG tablet Take by  mouth daily. 12/18/20  Yes [provider]  omeprazole (PRILOSEC) 40 MG capsule Take 1 capsule (40 mg total) by mouth at bedtime. Patient taking differently: Take 40 mg by mouth 2 (two) times daily. 12/22/20  Yes Gerlene Fee, NP  OZEMPIC, 1 MG/DOSE, 4 MG/3ML SOPN Inject 1 mg into the skin once a week. 12/22/20  Yes Gerlene Fee, NP  potassium chloride SA (KLOR-CON) 20 MEQ tablet Take 20 mEq by mouth 2 (two) times daily. 12/31/20  Yes [provider]  pravastatin (PRAVACHOL) 20 MG tablet TAKE 1 TABLET(20 MG) BY MOUTH AT BEDTIME 12/22/20  Yes Gerlene Fee, NP  torsemide (DEMADEX) 20 MG tablet Take 40 mg by mouth 2 (two) times daily. 03/25/21  Yes [provider]  TURMERIC CURCUMIN PO Take 2,000 mg by mouth at bedtime.    Yes [provider]  venlafaxine XR (EFFEXOR-XR) 37.5 MG 24 hr capsule Take 37.5 mg by mouth daily. 04/01/21  Yes [provider]  budesonide (PULMICORT) 180 MCG/ACT inhaler Inhale 1 puff into the lungs daily. Patient not taking: Reported on 07/21/2021    [provider]  Continuous Blood Gluc Receiver (FREESTYLE LIBRE 14 DAY READER) DEVI 1 reader 12/22/20   Gerlene Fee, NP  Continuous Blood Gluc Sensor (FREESTYLE LIBRE 14 DAY SENSOR) MISC 1 sensor 12/28/17   [provider]  Elastic Bandages & Supports ([place]) [place] 1 each by Does not apply route as directed. Low pressure knee high compression stockings Dx: leg swelling 05/13/20   Satira Sark, MD  INS SYRINGE/NEEDLE .5CC/28G 28G X 1/2" 0.5 ML MISC Inject 1 Syringe into the skin See admin instructions. 12/22/20   Gerlene Fee, NP  lidocaine (LIDODERM) 5 % Place 1 patch onto the skin daily. Remove & Discard patch within 12 hours or as directed by MD Patient not taking: No sig reported 03/25/21   Heath Lark D, DO    Current Facility-Administered Medications  Medication Dose Route Frequency Provider Last Rate Last Admin   albuterol  (PROVENTIL) (2.5 MG/3ML) 0.083% nebulizer solution 2.5 mg  2.5 mg Nebulization Q6H PRN Zierle-Ghosh, Asia B, DO       ALPRAZolam (XANAX) tablet 0.5 mg  0.5 mg Oral Daily PRN Zierle-Ghosh, Asia B, DO   0.5 mg at 07/21/21 2304   cefTRIAXone (ROCEPHIN) 2 g in sodium chloride 0.9 % 100 mL IVPB  2 g Intravenous Q24H Poindexter, Leann T, RPH       Chlorhexidine Gluconate Cloth 2 % PADS 6 each  6 each Topical Daily Donne Hazel, MD   6  each at 07/22/21 0958   ferrous sulfate tablet 325 mg  325 mg Oral q AM Zierle-Ghosh, Asia B, DO   325 mg at 07/22/21 7169   hydrocortisone sodium succinate (SOLU-CORTEF) 100 MG injection 100 mg  100 mg Intravenous Q12H Zierle-Ghosh, Asia B, DO   100 mg at 07/22/21 0516   ibuprofen (ADVIL) tablet 400 mg  400 mg Oral Q6H PRN Zierle-Ghosh, Asia B, DO       insulin aspart (novoLOG) injection 0-15 Units  0-15 Units Subcutaneous TID WC Zierle-Ghosh, Asia B, DO   2 Units at 07/22/21 0808   insulin aspart (novoLOG) injection 0-5 Units  0-5 Units Subcutaneous QHS Zierle-Ghosh, Asia B, DO   0 Units at 07/21/21 2309   insulin detemir (LEVEMIR) injection 30 Units  30 Units Subcutaneous QHS Zierle-Ghosh, Asia B, DO   30 Units at 07/21/21 2341   lactulose (CHRONULAC) 10 GM/15ML solution 20 g  20 g Oral Daily Zierle-Ghosh, Asia B, DO       loratadine (CLARITIN) tablet 10 mg  10 mg Oral q AM Zierle-Ghosh, Asia B, DO   10 mg at 07/22/21 6789   midodrine (PROAMATINE) tablet 10 mg  10 mg Oral TID AC Zierle-Ghosh, Asia B, DO   10 mg at 07/22/21 0847   morphine 2 MG/ML injection 2 mg  2 mg Intravenous Q2H PRN Zierle-Ghosh, Asia B, DO   2 mg at 07/21/21 2303   octreotide (SANDOSTATIN) 500 mcg in sodium chloride 0.9 % 250 mL (2 mcg/mL) infusion  50 mcg/hr Intravenous Continuous Zierle-Ghosh, Asia B, DO 25 mL/hr at 07/22/21 0953 50 mcg/hr at 07/22/21 0953   ondansetron (ZOFRAN) tablet 4 mg  4 mg Oral Q6H PRN Zierle-Ghosh, Asia B, DO       Or   ondansetron (ZOFRAN) injection 4 mg  4 mg  Intravenous Q6H PRN Zierle-Ghosh, Asia B, DO   4 mg at 07/21/21 2302   oxyCODONE (Oxy IR/ROXICODONE) immediate release tablet 5 mg  5 mg Oral Q4H PRN Zierle-Ghosh, Asia B, DO   5 mg at 07/21/21 2304   pantoprozole (PROTONIX) 80 mg /NS 100 mL infusion  8 mg/hr Intravenous Continuous Zierle-Ghosh, Asia B, DO 10 mL/hr at 07/22/21 0958 8 mg/hr at 07/22/21 0958   pravastatin (PRAVACHOL) tablet 20 mg  20 mg Oral q1800 Zierle-Ghosh, Asia B, DO       venlafaxine XR (EFFEXOR-XR) 24 hr capsule 37.5 mg  37.5 mg Oral Daily Zierle-Ghosh, Asia B, DO        Allergies as of 07/21/2021 - Review Complete 07/21/2021  Allergen Reaction Noted   Fluticasone Rash 04/30/2020    Family History  Problem Relation Age of Onset   Arthritis Other    Cancer Other    Diabetes Other    CAD Father    Diabetes Mellitus II Father    Liver cancer Father    Hodgkin's lymphoma Father    CAD Brother    Diabetes Mellitus II Brother    ALS Mother    Diabetes Mellitus II Sister    Diabetes Mellitus II Brother    Diabetes Mellitus II Maternal Grandmother    Aneurysm Maternal Grandmother    Cancer Maternal Grandfather    Anesthesia problems Neg Hx    Hypotension Neg Hx    Malignant hyperthermia Neg Hx    Pseudochol deficiency Neg Hx    Colon cancer Neg Hx     Social History   Socioeconomic History   Marital status: Married    Spouse  name: Not on file   Number of children: 0   Years of education: college   Highest education level: Not on file  Occupational History   Occupation: Maintenance tech    Employer: [employer]  Tobacco Use   Smoking status: Never   Smokeless tobacco: Never  Vaping Use   Vaping Use: Never used  Substance and Sexual Activity   Alcohol use: Not Currently   Drug use: No   Sexual activity: Not Currently  Other Topics Concern   Not on file  Social History Narrative   Not on file   Social Determinants of Health   Financial Resource Strain: Not on file  Food Insecurity: Not on  file  Transportation Needs: Not on file  Physical Activity: Not on file  Stress: Not on file  Social Connections: Not on file  Intimate Partner Violence: Not on file    Review of Systems: Gen: Denies fever, chills, loss of appetite, change in weight or weight loss CV: Denies chest pain, heart palpitations, syncope, edema  Resp: Denies shortness of breath with rest, cough, wheezing GI: Denies dysphagia or odynophagia. Denies vomiting blood, jaundice, and fecal incontinence.  GU : Denies urinary burning, urinary frequency, urinary incontinence.  MS: Denies joint pain,swelling, cramping Derm: Denies rash, itching, dry skin Psych: Denies depression, anxiety, or memory loss. Endorses confusion prior to admission. Heme: Denies bruising, bleeding, and enlarged lymph nodes.  Physical Exam: Vital signs in last 24 hours: Temp:  [97.6 F (36.4 C)-98.4 F (36.9 C)] 97.6 F (36.4 C) (08/10 0754) Pulse Rate:  [70-87] 71 (08/10 0422) Resp:  [12-22] 13 (08/10 0422) BP: (93-139)/(33-94) 107/65 (08/10 0422) SpO2:  [96 %-100 %] 98 % (08/10 0422) Weight:  [120.2 kg-124.1 kg] 124.1 kg (08/10 0032) Last BM Date: 07/21/21 General:   Alert,  Well-developed, well-nourished, pleasant and cooperative in NAD Head:  Normocephalic and atraumatic. Eyes:  Sclera clear, no icterus.   Conjunctiva pink. Nose:  No deformity, discharge,  or lesions. Mouth:  No deformity or lesions, dentition normal. Neck:  Supple; no masses or thyromegaly. Lungs:  Clear throughout to auscultation.   No wheezes, crackles, or rhonchi. No acute distress. Heart:  Regular rate and rhythm; no murmurs, clicks, rubs,  or gallops. Abdomen:  Soft, nontender and nondistended. No masses, hepatosplenomegaly or hernias noted. Normal bowel sounds, without guarding, and without rebound.   Rectal:  Deferred until time of colonoscopy.   Msk:  Symmetrical without gross deformities. Normal posture. Pulses:  Normal pulses noted. Extremities:   Without clubbing, mild pitting edema of bilateral feet Neurologic:  Alert and  oriented x4;  grossly normal neurologically. Skin:  Intact without significant lesions or rashes. No jaundice Psych:  Alert and cooperative. Normal mood and affect.  Intake/Output from previous day: 08/09 0701 - 08/10 0700 In: 1267.9 [I.V.:817.9; Blood:350; IV Piggyback:100] Out: -  Intake/Output this shift: Total I/O In: 342.8 [I.V.:142.8; IV Piggyback:200] Out: -   Lab Results: Recent Labs    07/21/21 1644 07/22/21 0623  WBC 6.9 4.6  HGB 6.7* 7.1*  HCT 23.8* 25.2*  PLT 95* 78*   BMET Recent Labs    07/21/21 1644 07/22/21 0623  NA 132* 135  K 3.4* 4.3  CL 98 102  CO2 26 27  GLUCOSE 297* 149*  BUN 26* 22  CREATININE 1.29* 1.24  CALCIUM 8.1* 8.1*   LFT Recent Labs    07/21/21 1644 07/22/21 0623  PROT 6.4* 5.8*  ALBUMIN 3.0* 2.7*  AST 26 25  ALT  14 13  ALKPHOS 70 56  BILITOT 1.3* 1.6*   PT/INR Recent Labs    07/21/21 1644 07/22/21 0623  LABPROT 15.0 16.3*  INR 1.2 1.3*   Hepatitis Panel No results for input(s): HEPBSAG, HCVAB, HEPAIGM, HEPBIGM in the last 72 hours. C-Diff No results for input(s): CDIFFTOX in the last 72 hours.  Studies/Results: CT HEAD WO CONTRAST (5MM)  Result Date: 07/21/2021 CLINICAL DATA:  Slurred speech. Concern for cerebrovascular accident EXAM: CT HEAD WITHOUT CONTRAST TECHNIQUE: Contiguous axial images were obtained from the base of the skull through the vertex without intravenous contrast. COMPARISON:  07/08/2016 FINDINGS: Brain: No acute intracranial hemorrhage. No focal mass lesion. No CT evidence of acute infarction. No midline shift or mass effect. No hydrocephalus. Basilar cisterns are patent. Vascular: No hyperdense vessel or unexpected calcification. Skull: Normal. Negative for fracture or focal lesion. Sinuses/Orbits: Mucosal thickening in the RIGHT maxillary sinus. Prior ethmoidectomy. Other: None IMPRESSION: 1. No acute intracranial  findings. 2. No CT evidence of cortical infarction. 3. Chronic mucosal thickening in the RIGHT maxillary sinus. Electronically Signed   By: Suzy Bouchard M.D.   On: 07/21/2021 17:49    Impression: [name] is a 60 year old male with history of liver cirrhosis (previous history of alcohol abuse, but none since 1982, likely NASH) and esophageal varices with multiple previous bandings who presented to the ED last night with altered mental status. Patient is currently following with [facility] for possible acceptance onto transplant list.   Encephalopathy: A/O this morning with no signs of confusion. Ammonia 68 yesterday. No asterixis noted during exam. Patient able to provide some history and converse appropriately. Lactulose started this morning. Will continue lactulose daily with good improvement of mental status.  Anemia: ongoing, patient has been seen by hem/onc in the past and had previous bone marrow biopsy in 2021 for evaluation of anemia, was thought to be likely autoimmune related.   hgb 7.1 today s/p 1 unit PRBCs yesterday. Creatnine WNL this morning at 1.24. Patient denies any melena or overt GI bleeding at this time, however, FOBT in ED was positive and patient has significant history of esophageal varices r/t cirrhosis. Has not seen any black stools or BRBPR at home, and denies hematemesis, however, Patient will need EGD for further evaluation of anemia, as variceal bleeding cannot be ruled out. patient is followed by [facility] and had recent EGD for variceal evaluation and banding a few weeks ago. Anemia likely multifactorial given his history.  Rocephin prophylaxis for SBP will be continued at this time.  Cirrhosis: Hepatic cirrhosis with portal hypertension on US abdomen 06/21/20, diagnosis of NASH in 2021 (history of heavy alcohol use in 1982, but none since) patient was referred to [facility] for decompensated cirrhosis with/portal HTN and HE at that time.  LFTs remain WNL this morning. INR 1.3  Total bili 1.6, up from 1.3 yesterday. Albumin 2.7. AFP 2.8 in march 2022. Patient on 150 mg spironolactone daily and 48m torsemide daily at home. Nadolol 219mdaily outpatient as well, held inpatient due to hypotension. No jaundice, no overt ascites, mild edema to bilatera LEs.   Meld-Na: 1939hild Pugh: 7, Class B  Hep A antibody non reactive, Hep C antibody non reactive, Hep B core and surface antibody and antigen non reactive in Feb 2022.   Plan: Continue rocephin for SBP prophylaxis Continue to monitor mental status related to HE, ammonia repeat tomorrow morning Continue lactulose daily Continue to Trend H&H Continue to trend LFTs Monitor INR  Continue  octreotide infusion Continue PPI infusion Plan for EGD when hemoglobin stable (8 or better) Transfuse as necessary per hospitalist orders Continue to hold nadalol due to hypotension  Indications, risks and benefits of procedure discussed in detail with patient. Patient verbalized understanding and is in agreement to proceed with EGD at this time.     LOS: 1 day    07/22/2021, 10:03 AM  Santana Gosdin L. Alver Sorrow, MSN, APRN, AGNP-C Adult-Gerontology Nurse Practitioner [facility]

## 2021-07-22 NOTE — Progress Notes (Signed)
PROGRESS NOTE    [name]  JHE:[id number] DOB: October 20, 1961 DOA: 07/21/2021 PCP: Christopher Sites, MD    Brief Narrative:  60 y.o. male, with history of anemia, coronary artery disease, cirrhosis currently undergoing evaluation to be on transplant list, CKD, diastolic CHF, essential hypertension, hyperlipidemia, type 2 diabetes mellitus, and more presents the ED with a chief complaint of altered mental status. Of note, pt recently had multiple esophageal varices banded. Presented with hgb of 6.7 with ammonia of 68. Pt was admitted for concerns of acute blood loss anemia  Assessment & Plan:   Active Problems:   Diabetes mellitus type 2, uncontrolled (HCC)   Acute on chronic heart failure with preserved ejection fraction (HCC)   Acute renal failure superimposed on stage 3b chronic kidney disease (HCC)   Anemia   GI bleed   Acute metabolic encephalopathy    GI bleeding and symptomatic anemia With history of banded varices Continued on Protonix, hold aspirin Continue simvastatin and Rocephin Clear liquids per GI GI consulted. Recs for possible EGD if hgb remains stable >8 Continue to follow CBC trends Acute metabolic encephalopathy Multifactorial Suspect possible UTI, hepatic encephalopahty Treat as below Cont abx and lactulose CT head shows no acute changes Elevated ammonia In setting of cirrhosis Continue lactulose Cont to follow ammonia trends CKD stage III Renal function seems to be at baseline Continue to monitor Diabetes mellitus type 2 Monitor CBG every 6 hours Sliding scale coverage Reduce dose of basal insulin Continue to monitor Possible UTI UA is borderline suspicious for UTI Urine culture pending Continue Rocephin that has already been started for GI bleed will cover UTI Continue to monitor Adrenal insufficiency Solu-Cortef given in the ED Continue Solu-Cortef BP presently stable and controlled Hypokalemia Corrected Repeat bmet in AM    Hypertension BP meds remain on hold secondary to soft pressures seen at time of presentation Would gradually resume bp meds as bp allows    DVT prophylaxis: SCD's Code Status: Full Family Communication: Pt in room, family not at bedside  Status is: Inpatient  Remains inpatient appropriate because:Inpatient level of care appropriate due to severity of illness  Dispo: The patient is from: Home              Anticipated d/c is to: Home              Patient currently is not medically stable to d/c.   Difficult to place patient No       Consultants:  GI  Procedures:    Antimicrobials: Anti-infectives (From admission, onward)    Start     Dose/Rate Route Frequency Ordered Stop   07/22/21 2100  cefTRIAXone (ROCEPHIN) 2 g in sodium chloride 0.9 % 100 mL IVPB        2 g 200 mL/hr over 30 Minutes Intravenous Every 24 hours 07/21/21 2159     07/22/21 2030  cefTRIAXone (ROCEPHIN) 1 g in sodium chloride 0.9 % 100 mL IVPB  Status:  Discontinued        1 g 200 mL/hr over 30 Minutes Intravenous Every 24 hours 07/21/21 2128 07/21/21 2159   07/21/21 2030  cefTRIAXone (ROCEPHIN) 1 g in sodium chloride 0.9 % 100 mL IVPB        1 g 200 mL/hr over 30 Minutes Intravenous  Once 07/21/21 2025 07/21/21 2158       Subjective: Without complaints  Objective: Vitals:   07/22/21 1400 07/22/21 1500 07/22/21 1600 07/22/21 1700  BP: (!) 111/57 (!) 122/45 [name])  120/46 107/84  Pulse: 68 68 71 68  Resp:      Temp:      TempSrc:      SpO2: 98% 99% 100% 100%  Weight:      Height:        Intake/Output Summary (Last 24 hours) at 07/22/2021 1759 Last data filed at 07/22/2021 1751 Gross per 24 hour  Intake 1882.92 ml  Output 1 ml  Net 1881.92 ml   Filed Weights   07/21/21 1645 07/22/21 0032  Weight: 120.2 kg 124.1 kg    Examination: General exam: Awake, laying in bed, in nad Respiratory system: Normal respiratory effort, no wheezing Cardiovascular system: regular rate, s1,  s2 Gastrointestinal system: Soft, nondistended, positive BS Central nervous system: CN2-12 grossly intact, strength intact Extremities: Perfused, no clubbing Skin: Normal skin turgor, no notable skin lesions seen Psychiatry: Mood normal // no visual hallucinations   Data Reviewed: I have personally reviewed following labs and imaging studies  CBC: Recent Labs  Lab 07/21/21 1644 07/22/21 0623  WBC 6.9 4.6  NEUTROABS 5.0  --   HGB 6.7* 7.1*  HCT 23.8* 25.2*  MCV 91.9 92.6  PLT 95* 78*   Basic Metabolic Panel: Recent Labs  Lab 07/21/21 1644 07/22/21 0623  NA 132* 135  K 3.4* 4.3  CL 98 102  CO2 26 27  GLUCOSE 297* 149*  BUN 26* 22  CREATININE 1.29* 1.24  CALCIUM 8.1* 8.1*  MG  --  2.2   GFR: Estimated Creatinine Clearance: 82.6 mL/min (by C-G formula based on SCr of 1.24 mg/dL). Liver Function Tests: Recent Labs  Lab 07/21/21 1644 07/22/21 0623  AST 26 25  ALT 14 13  ALKPHOS 70 56  BILITOT 1.3* 1.6*  PROT 6.4* 5.8*  ALBUMIN 3.0* 2.7*   No results for input(s): LIPASE, AMYLASE in the last 168 hours. Recent Labs  Lab 07/21/21 1644  AMMONIA 68*   Coagulation Profile: Recent Labs  Lab 07/21/21 1644 07/22/21 0623  INR 1.2 1.3*   Cardiac Enzymes: No results for input(s): CKTOTAL, CKMB, CKMBINDEX, TROPONINI in the last 168 hours. BNP (last 3 results) No results for input(s): PROBNP in the last 8760 hours. HbA1C: Recent Labs    07/22/21 0623  HGBA1C 6.7*   CBG: Recent Labs  Lab 07/21/21 2307 07/22/21 0033 07/22/21 0757 07/22/21 1141 07/22/21 1707  GLUCAP 177* 155* 131* 126* 107*   Lipid Profile: No results for input(s): CHOL, HDL, LDLCALC, TRIG, CHOLHDL, LDLDIRECT in the last 72 hours. Thyroid Function Tests: No results for input(s): TSH, T4TOTAL, FREET4, T3FREE, THYROIDAB in the last 72 hours. Anemia Panel: No results for input(s): VITAMINB12, FOLATE, FERRITIN, TIBC, IRON, RETICCTPCT in the last 72 hours. Sepsis Labs: No results for  input(s): PROCALCITON, LATICACIDVEN in the last 168 hours.  Recent Results (from the past 240 hour(s))  Resp Panel by RT-PCR (Flu A&B, Covid) Nasopharyngeal Swab     Status: None   Collection Time: 07/21/21  9:21 PM   Specimen: Nasopharyngeal Swab; Nasopharyngeal(NP) swabs in vial transport medium  Result Value Ref Range Status   SARS Coronavirus 2 by RT PCR NEGATIVE NEGATIVE Final    Comment: (NOTE) SARS-CoV-2 target nucleic acids are NOT DETECTED.  The SARS-CoV-2 RNA is generally detectable in upper respiratory specimens during the acute phase of infection. The lowest concentration of SARS-CoV-2 viral copies this assay can detect is 138 copies/mL. A negative result does not preclude SARS-Cov-2 infection and should not be used as the sole basis for treatment or other  patient management decisions. A negative result may occur with  improper specimen collection/handling, submission of specimen other than nasopharyngeal swab, presence of viral mutation(s) within the areas targeted by this assay, and inadequate number of viral copies(<138 copies/mL). A negative result must be combined with clinical observations, patient history, and epidemiological information. The expected result is Negative.  Fact Sheet for Patients:  EntrepreneurPulse.com.au  Fact Sheet for Healthcare Providers:  IncredibleEmployment.be  This test is no t yet approved or cleared by the [place] FDA and  has been authorized for detection and/or diagnosis of SARS-CoV-2 by FDA under an Emergency Use Authorization (EUA). This EUA will remain  in effect (meaning this test can be used) for the duration of the COVID-19 declaration under Section 564(b)(1) of the Act, 21 U.S.C.section 360bbb-3(b)(1), unless the authorization is terminated  or revoked sooner.       Influenza A by PCR NEGATIVE NEGATIVE Final   Influenza B by PCR NEGATIVE NEGATIVE Final    Comment: (NOTE) The  Xpert Xpress SARS-CoV-2/FLU/RSV plus assay is intended as an aid in the diagnosis of influenza from Nasopharyngeal swab specimens and should not be used as a sole basis for treatment. Nasal washings and aspirates are unacceptable for Xpert Xpress SARS-CoV-2/FLU/RSV testing.  Fact Sheet for Patients: EntrepreneurPulse.com.au  Fact Sheet for Healthcare Providers: IncredibleEmployment.be  This test is not yet approved or cleared by the [place] FDA and has been authorized for detection and/or diagnosis of SARS-CoV-2 by FDA under an Emergency Use Authorization (EUA). This EUA will remain in effect (meaning this test can be used) for the duration of the COVID-19 declaration under Section 564(b)(1) of the Act, 21 U.S.C. section 360bbb-3(b)(1), unless the authorization is terminated or revoked.  Performed at [facility], [address] [place], [place] [address]   MRSA Next Gen by PCR, Nasal     Status: None   Collection Time: 07/22/21 12:11 AM   Specimen: Nasal Mucosa; Nasal Swab  Result Value Ref Range Status   MRSA by PCR Next Gen NOT DETECTED NOT DETECTED Final    Comment: (NOTE) The GeneXpert MRSA Assay (FDA approved for NASAL specimens only), is one component of a comprehensive MRSA colonization surveillance program. It is not intended to diagnose MRSA infection nor to guide or monitor treatment for MRSA infections. Test performance is not FDA approved in patients less than 93 years old. Performed at [facility], [address], [place], [place] [address]      Radiology Studies: CT HEAD WO CONTRAST (5MM)  Result Date: 07/21/2021 CLINICAL DATA:  Slurred speech. Concern for cerebrovascular accident EXAM: CT HEAD WITHOUT CONTRAST TECHNIQUE: Contiguous axial images were obtained from the base of the skull through the vertex without intravenous contrast. COMPARISON:  07/08/2016 FINDINGS: Brain: No acute intracranial hemorrhage. No focal mass  lesion. No CT evidence of acute infarction. No midline shift or mass effect. No hydrocephalus. Basilar cisterns are patent. Vascular: No hyperdense vessel or unexpected calcification. Skull: Normal. Negative for fracture or focal lesion. Sinuses/Orbits: Mucosal thickening in the RIGHT maxillary sinus. Prior ethmoidectomy. Other: None IMPRESSION: 1. No acute intracranial findings. 2. No CT evidence of cortical infarction. 3. Chronic mucosal thickening in the RIGHT maxillary sinus. Electronically Signed   By: Suzy Bouchard M.D.   On: 07/21/2021 17:49    Scheduled Meds:  Chlorhexidine Gluconate Cloth  6 each Topical Daily   ferrous sulfate  325 mg Oral q AM   hydrocortisone sod succinate (SOLU-CORTEF) inj  100 mg Intravenous Q12H  insulin aspart  0-15 Units Subcutaneous TID WC   insulin aspart  0-5 Units Subcutaneous QHS   insulin detemir  30 Units Subcutaneous QHS   lactulose  20 g Oral Daily   loratadine  10 mg Oral q AM   midodrine  10 mg Oral TID AC   pravastatin  20 mg Oral q1800   venlafaxine XR  37.5 mg Oral Daily   Continuous Infusions:  cefTRIAXone (ROCEPHIN)  IV     octreotide  (SANDOSTATIN)    IV infusion Stopped (07/22/21 1750)   pantoprazole 8 mg/hr (07/22/21 1751)     LOS: 1 day   Marylu Lund, MD [facility] Pager On Amion  If 7PM-7AM, please contact night-coverage 07/22/2021, 5:59 PM

## 2021-07-22 NOTE — Consult Note (Signed)
Referring Provider: No ref. provider found Primary Care Physician:  [name], MD Primary Gastroenterologist:  Dr.Vance  Date of Admission: 07/21/21 Date of Consultation: 07/22/21  Reason for Consultation: GI bleed  HPI:  [name] is a 60 y.o. year old male with pmh of anemia, CAD, cirrhosis (currently under evalution to be on transplant list), CKD, diastolic CHF, HTN, HLD, Type II DM who presnted to the ED yesterday with c/o AMS. Patient unsure why he was in the ED, was accompanied by his wife who apparently was not able to provide much significant history either. States patient had been confused but it worsened around 10  am yesterday. States he called her and was not answering questions appropriately. He was recently started on 10 day course of antibiotics due to chest congestion and wife was concerned patient may be dehydrated. Wife reported patient is normally very even tempered, however, the night prior to ED admission, patient was short tempered and using profanity. No reports of any visualized blood in stools. He is following at [facility] for possible liver transplant and has had 9 esophageal varices banded over the past 2 months. Supposed to have follow up EGD on 8/15. At baseline patient is A/Ox4 with no reported memory issues. He has reportedly never had hepatic encephalopathy in the past and he is compliant with his lactulose.   ED Course: initially brought in by EMS with c/o possible stroke, reportedly slurring his words and confused, patient reported feeling confused and having some issues speaking but states he felt better on arrival to ED. vitals WNL, WBC 6.9, hgb 6.7, Na 132, creatnine 1.29, albumin 3, ammonia 68, CT head with no acute findings, solu-cortef given in ED for adrenal insufficiency, UA borderling for UTI with urine culture pending. FOBT positive, brown stool no BRBPR noted per ED notes, Rocephin started in ED for SBP prophylaxis as well as octreotide and PPI  drip.   Consult:  Patient a/o this morning during consult and is able to conversate appropriately. Reports that he was feeling confused which is what brought him to the ED. He denies any recent black stools or BRBPR. He denies any previous issues with confusion. He is unable to pinpoint when his confusion started but states according to what his wife told him, it was yesterday. He denies any abdominal pain, nausea, vomiting, constipation or diarrhea. No swelling to abdomen, but he has noticed some swelling to bilateral LEs. Reports he is taking his lactulose as prescribed and is having 6-7 BMs per day. Patient's wife reports that patient had multiple esophageal bandings recently and 8/15 was his follow up EGD for evaluation/possible banding.   Hgb 7.1 this morning, plt 78,000, INR 1.3, up from 1.2 yesterday, transaminases and alk phos WNL, total bili 1.6, trending up from 1.3 yesterday. Albumin 2.7. ammonia 68 yesterday.   Past Medical History:  Diagnosis Date   Anemia    Arthritis    CAD (coronary artery disease)    Multivessel disease status post CABG 08/2015   Cirrhosis ([name])    CKD (chronic kidney disease) stage 3, GFR 30-59 ml/min (HCC)    Diastolic CHF ([place])    Essential hypertension    Hyperlipidemia    Iron deficiency anemia 07/15/2021   OSA on CPAP    Polyclonal gammopathy    Thrombocytopenia ([place]) 2016   Type 2 diabetes mellitus ([place])     Past Surgical History:  Procedure Laterality Date   APPENDECTOMY     Biceps tendon surgery  Right    BIOPSY  11/22/2019   Procedure: BIOPSY;  Surgeon: Daneil Dolin, MD;  [facility];  Service: Endoscopy;;   CARDIAC CATHETERIZATION N/A 08/25/2015   Procedure: Left Heart Cath and Coronary Angiography;  Surgeon: Belva Crome, MD;  Location: [facility];  Service: Cardiovascular;  Laterality: N/A;   COLONOSCOPY WITH PROPOFOL N/A 11/22/2019   Procedure: COLONOSCOPY WITH PROPOFOL;  Surgeon: Daneil Dolin, MD; Four 4-5 mm  polyps, findings suggestive of portal colopathy, congested appearing colonic mucosa diffusely, rectal varices, and adequate right colon prep.  Pathology with tubular adenomas and hyperplastic polyp.  Right colon biopsy with focal active colitis.  Recommendations to repeat colonoscopy in 3 months due to poor prep.   COLONOSCOPY WITH PROPOFOL N/A 03/17/2020   Procedure: COLONOSCOPY WITH PROPOFOL;  Surgeon: Daneil Dolin, MD;  [facility];  Service: Endoscopy;  Laterality: N/A;  8:45am - pt does not need covid test, was + 2/4 <90 days   CORONARY ARTERY BYPASS GRAFT N/A 08/29/2015   Procedure: CORONARY ARTERY BYPASS GRAFTING (CABG);  Surgeon: [name], MD;  Location: [facility];  Service: Open Heart Surgery;  Laterality: N/A;   ESOPHAGOGASTRODUODENOSCOPY (EGD) WITH PROPOFOL N/A 11/22/2019   Procedure: ESOPHAGOGASTRODUODENOSCOPY (EGD) WITH PROPOFOL;  Surgeon: Daneil Dolin, MD; 4 columns of grade 2-3 esophageal varices, portal gastropathy, gastric polyp/abnormal gastric mucosa s/p biopsy.  Pathology with hyperplastic polyp, mild chronic gastritis, negative H. pylori.   KNEE ARTHROSCOPY Left    TEE WITHOUT CARDIOVERSION N/A 08/29/2015   Procedure: TRANSESOPHAGEAL ECHOCARDIOGRAM (TEE);  Surgeon: [name], MD;  Location: [facility];  Service: Open Heart Surgery;  Laterality: N/A;   TOTAL KNEE ARTHROPLASTY Left 03/09/2017   Procedure: TOTAL KNEE ARTHROPLASTY;  Surgeon: Carole Civil, MD;  Location: [facility];  Service: Orthopedics;  Laterality: Left;    Prior to Admission medications   Medication Sig Start Date End Date Taking? Authorizing Provider  acetaminophen (TYLENOL) 325 MG tablet Take 650 mg by mouth every 6 (six) hours as needed for mild pain. 12/19/20  Yes [provider]  albuterol (VENTOLIN HFA) 108 (90 Base) MCG/ACT inhaler Inhale 2 puffs into the lungs every 6 (six) hours as needed for wheezing or shortness of breath. 12/22/20  Yes Gerlene Fee, NP   ALPRAZolam Duanne Moron) 0.5 MG tablet Take 1 tablet (0.5 mg total) by mouth daily as needed. 12/22/20  Yes Gerlene Fee, NP  Armodafinil 200 MG TABS Take 200 mg by mouth daily as needed (for sleepiness). 12/22/20  Yes Gerlene Fee, NP  aspirin EC 81 MG tablet Take 81 mg by mouth at bedtime.    Yes [provider]  Cholecalciferol (DIALYVITE VITAMIN D 5000) 125 MCG (5000 UT) capsule Take 5,000 Units by mouth in the morning.    Yes [provider]  ferrous sulfate 325 (65 FE) MG tablet Take 325 mg by mouth in the morning.    Yes [provider]  Flaxseed, Linseed, (FLAXSEED OIL) 1000 MG CAPS Take 1,000 mg by mouth at bedtime. 12/16/20  Yes [provider]  hydrochlorothiazide (HYDRODIURIL) 25 MG tablet Take 0.5 tablets by mouth daily. 03/22/21  Yes [provider]  insulin aspart (NOVOLOG) 100 UNIT/ML injection Inject 5 Units into the skin 3 (three) times daily before meals. Special Instructions: If accu-check is greater than 150. Hold for accu-check 150 or below. With Meals 12/22/20  Yes Gerlene Fee, NP  insulin degludec (TRESIBA FLEXTOUCH) 200 UNIT/ML  FlexTouch Pen Inject 40 Units into the skin in the morning. Give 30 units Subcutaneous  at Bedtime 8:00 pm 12/22/20  Yes Gerlene Fee, NP  lactulose (CHRONULAC) 10 GM/15ML solution Take 30 mLs (20 g total) by mouth daily. 12/22/20  Yes Gerlene Fee, NP  lidocaine (XYLOCAINE) 2 % solution 5 mLs as needed. 06/18/21  Yes [provider]  loratadine (CLARITIN) 10 MG tablet Take 10 mg by mouth in the morning.    Yes [provider]  Magnesium 250 MG TABS Take 250 mg by mouth daily.   Yes [provider]  midodrine (PROAMATINE) 10 MG tablet Take 10 mg by mouth 3 (three) times daily.   Yes [provider]  Misc Natural Products (ELDERBERRY ZINC/VIT C/IMMUNE) LOZG Take 1 lozenge by mouth at bedtime. 12/16/20  Yes [provider]  nadolol (CORGARD) 20 MG tablet Take by  mouth daily. 12/18/20  Yes [provider]  omeprazole (PRILOSEC) 40 MG capsule Take 1 capsule (40 mg total) by mouth at bedtime. Patient taking differently: Take 40 mg by mouth 2 (two) times daily. 12/22/20  Yes Gerlene Fee, NP  OZEMPIC, 1 MG/DOSE, 4 MG/3ML SOPN Inject 1 mg into the skin once a week. 12/22/20  Yes Gerlene Fee, NP  potassium chloride SA (KLOR-CON) 20 MEQ tablet Take 20 mEq by mouth 2 (two) times daily. 12/31/20  Yes [provider]  pravastatin (PRAVACHOL) 20 MG tablet TAKE 1 TABLET(20 MG) BY MOUTH AT BEDTIME 12/22/20  Yes Gerlene Fee, NP  torsemide (DEMADEX) 20 MG tablet Take 40 mg by mouth 2 (two) times daily. 03/25/21  Yes [provider]  TURMERIC CURCUMIN PO Take 2,000 mg by mouth at bedtime.    Yes [provider]  venlafaxine XR (EFFEXOR-XR) 37.5 MG 24 hr capsule Take 37.5 mg by mouth daily. 04/01/21  Yes [provider]  budesonide (PULMICORT) 180 MCG/ACT inhaler Inhale 1 puff into the lungs daily. Patient not taking: Reported on 07/21/2021    [provider]  Continuous Blood Gluc Receiver (FREESTYLE LIBRE 14 DAY READER) DEVI 1 reader 12/22/20   Gerlene Fee, NP  Continuous Blood Gluc Sensor (FREESTYLE LIBRE 14 DAY SENSOR) MISC 1 sensor 12/28/17   [provider]  Elastic Bandages & Supports ([place]) [place] 1 each by Does not apply route as directed. Low pressure knee high compression stockings Dx: leg swelling 05/13/20   Satira Sark, MD  INS SYRINGE/NEEDLE .5CC/28G 28G X 1/2" 0.5 ML MISC Inject 1 Syringe into the skin See admin instructions. 12/22/20   Gerlene Fee, NP  lidocaine (LIDODERM) 5 % Place 1 patch onto the skin daily. Remove & Discard patch within 12 hours or as directed by MD Patient not taking: No sig reported 03/25/21   Heath Lark D, DO    Current Facility-Administered Medications  Medication Dose Route Frequency Provider Last Rate Last Admin   albuterol  (PROVENTIL) (2.5 MG/3ML) 0.083% nebulizer solution 2.5 mg  2.5 mg Nebulization Q6H PRN Zierle-Ghosh, Asia B, DO       ALPRAZolam (XANAX) tablet 0.5 mg  0.5 mg Oral Daily PRN Zierle-Ghosh, Asia B, DO   0.5 mg at 07/21/21 2304   cefTRIAXone (ROCEPHIN) 2 g in sodium chloride 0.9 % 100 mL IVPB  2 g Intravenous Q24H Poindexter, Leann T, RPH       Chlorhexidine Gluconate Cloth 2 % PADS 6 each  6 each Topical Daily Donne Hazel, MD   6  each at 07/22/21 0958   ferrous sulfate tablet 325 mg  325 mg Oral q AM Zierle-Ghosh, Asia B, DO   325 mg at 07/22/21 7169   hydrocortisone sodium succinate (SOLU-CORTEF) 100 MG injection 100 mg  100 mg Intravenous Q12H Zierle-Ghosh, Asia B, DO   100 mg at 07/22/21 0516   ibuprofen (ADVIL) tablet 400 mg  400 mg Oral Q6H PRN Zierle-Ghosh, Asia B, DO       insulin aspart (novoLOG) injection 0-15 Units  0-15 Units Subcutaneous TID WC Zierle-Ghosh, Asia B, DO   2 Units at 07/22/21 0808   insulin aspart (novoLOG) injection 0-5 Units  0-5 Units Subcutaneous QHS Zierle-Ghosh, Asia B, DO   0 Units at 07/21/21 2309   insulin detemir (LEVEMIR) injection 30 Units  30 Units Subcutaneous QHS Zierle-Ghosh, Asia B, DO   30 Units at 07/21/21 2341   lactulose (CHRONULAC) 10 GM/15ML solution 20 g  20 g Oral Daily Zierle-Ghosh, Asia B, DO       loratadine (CLARITIN) tablet 10 mg  10 mg Oral q AM Zierle-Ghosh, Asia B, DO   10 mg at 07/22/21 6789   midodrine (PROAMATINE) tablet 10 mg  10 mg Oral TID AC Zierle-Ghosh, Asia B, DO   10 mg at 07/22/21 0847   morphine 2 MG/ML injection 2 mg  2 mg Intravenous Q2H PRN Zierle-Ghosh, Asia B, DO   2 mg at 07/21/21 2303   octreotide (SANDOSTATIN) 500 mcg in sodium chloride 0.9 % 250 mL (2 mcg/mL) infusion  50 mcg/hr Intravenous Continuous Zierle-Ghosh, Asia B, DO 25 mL/hr at 07/22/21 0953 50 mcg/hr at 07/22/21 0953   ondansetron (ZOFRAN) tablet 4 mg  4 mg Oral Q6H PRN Zierle-Ghosh, Asia B, DO       Or   ondansetron (ZOFRAN) injection 4 mg  4 mg  Intravenous Q6H PRN Zierle-Ghosh, Asia B, DO   4 mg at 07/21/21 2302   oxyCODONE (Oxy IR/ROXICODONE) immediate release tablet 5 mg  5 mg Oral Q4H PRN Zierle-Ghosh, Asia B, DO   5 mg at 07/21/21 2304   pantoprozole (PROTONIX) 80 mg /NS 100 mL infusion  8 mg/hr Intravenous Continuous Zierle-Ghosh, Asia B, DO 10 mL/hr at 07/22/21 0958 8 mg/hr at 07/22/21 0958   pravastatin (PRAVACHOL) tablet 20 mg  20 mg Oral q1800 Zierle-Ghosh, Asia B, DO       venlafaxine XR (EFFEXOR-XR) 24 hr capsule 37.5 mg  37.5 mg Oral Daily Zierle-Ghosh, Asia B, DO        Allergies as of 07/21/2021 - Review Complete 07/21/2021  Allergen Reaction Noted   Fluticasone Rash 04/30/2020    Family History  Problem Relation Age of Onset   Arthritis Other    Cancer Other    Diabetes Other    CAD Father    Diabetes Mellitus II Father    Liver cancer Father    Hodgkin's lymphoma Father    CAD Brother    Diabetes Mellitus II Brother    ALS Mother    Diabetes Mellitus II Sister    Diabetes Mellitus II Brother    Diabetes Mellitus II Maternal Grandmother    Aneurysm Maternal Grandmother    Cancer Maternal Grandfather    Anesthesia problems Neg Hx    Hypotension Neg Hx    Malignant hyperthermia Neg Hx    Pseudochol deficiency Neg Hx    Colon cancer Neg Hx     Social History   Socioeconomic History   Marital status: Married    Spouse  name: Not on file   Number of children: 0   Years of education: college   Highest education level: Not on file  Occupational History   Occupation: Maintenance tech    Employer: [employer]  Tobacco Use   Smoking status: Never   Smokeless tobacco: Never  Vaping Use   Vaping Use: Never used  Substance and Sexual Activity   Alcohol use: Not Currently   Drug use: No   Sexual activity: Not Currently  Other Topics Concern   Not on file  Social History Narrative   Not on file   Social Determinants of Health   Financial Resource Strain: Not on file  Food Insecurity: Not on  file  Transportation Needs: Not on file  Physical Activity: Not on file  Stress: Not on file  Social Connections: Not on file  Intimate Partner Violence: Not on file    Review of Systems: Gen: Denies fever, chills, loss of appetite, change in weight or weight loss CV: Denies chest pain, heart palpitations, syncope, edema  Resp: Denies shortness of breath with rest, cough, wheezing GI: Denies dysphagia or odynophagia. Denies vomiting blood, jaundice, and fecal incontinence.  GU : Denies urinary burning, urinary frequency, urinary incontinence.  MS: Denies joint pain,swelling, cramping Derm: Denies rash, itching, dry skin Psych: Denies depression, anxiety, or memory loss. Endorses confusion prior to admission. Heme: Denies bruising, bleeding, and enlarged lymph nodes.  Physical Exam: Vital signs in last 24 hours: Temp:  [97.6 F (36.4 C)-98.4 F (36.9 C)] 97.6 F (36.4 C) (08/10 0754) Pulse Rate:  [70-87] 71 (08/10 0422) Resp:  [12-22] 13 (08/10 0422) BP: (93-139)/(33-94) 107/65 (08/10 0422) SpO2:  [96 %-100 %] 98 % (08/10 0422) Weight:  [120.2 kg-124.1 kg] 124.1 kg (08/10 0032) Last BM Date: 07/21/21 General:   Alert,  Well-developed, well-nourished, pleasant and cooperative in NAD Head:  Normocephalic and atraumatic. Eyes:  Sclera clear, no icterus.   Conjunctiva pink. Nose:  No deformity, discharge,  or lesions. Mouth:  No deformity or lesions, dentition normal. Neck:  Supple; no masses or thyromegaly. Lungs:  Clear throughout to auscultation.   No wheezes, crackles, or rhonchi. No acute distress. Heart:  Regular rate and rhythm; no murmurs, clicks, rubs,  or gallops. Abdomen:  Soft, nontender and nondistended. No masses, hepatosplenomegaly or hernias noted. Normal bowel sounds, without guarding, and without rebound.   Rectal:  Deferred until time of colonoscopy.   Msk:  Symmetrical without gross deformities. Normal posture. Pulses:  Normal pulses noted. Extremities:   Without clubbing, mild pitting edema of bilateral feet Neurologic:  Alert and  oriented x4;  grossly normal neurologically. Skin:  Intact without significant lesions or rashes. No jaundice Psych:  Alert and cooperative. Normal mood and affect.  Intake/Output from previous day: 08/09 0701 - 08/10 0700 In: 1267.9 [I.V.:817.9; Blood:350; IV Piggyback:100] Out: -  Intake/Output this shift: Total I/O In: 342.8 [I.V.:142.8; IV Piggyback:200] Out: -   Lab Results: Recent Labs    07/21/21 1644 07/22/21 0623  WBC 6.9 4.6  HGB 6.7* 7.1*  HCT 23.8* 25.2*  PLT 95* 78*   BMET Recent Labs    07/21/21 1644 07/22/21 0623  NA 132* 135  K 3.4* 4.3  CL 98 102  CO2 26 27  GLUCOSE 297* 149*  BUN 26* 22  CREATININE 1.29* 1.24  CALCIUM 8.1* 8.1*   LFT Recent Labs    07/21/21 1644 07/22/21 0623  PROT 6.4* 5.8*  ALBUMIN 3.0* 2.7*  AST 26 25  ALT  14 13  ALKPHOS 70 56  BILITOT 1.3* 1.6*   PT/INR Recent Labs    07/21/21 1644 07/22/21 0623  LABPROT 15.0 16.3*  INR 1.2 1.3*   Hepatitis Panel No results for input(s): HEPBSAG, HCVAB, HEPAIGM, HEPBIGM in the last 72 hours. C-Diff No results for input(s): CDIFFTOX in the last 72 hours.  Studies/Results: CT HEAD WO CONTRAST (5MM)  Result Date: 07/21/2021 CLINICAL DATA:  Slurred speech. Concern for cerebrovascular accident EXAM: CT HEAD WITHOUT CONTRAST TECHNIQUE: Contiguous axial images were obtained from the base of the skull through the vertex without intravenous contrast. COMPARISON:  07/08/2016 FINDINGS: Brain: No acute intracranial hemorrhage. No focal mass lesion. No CT evidence of acute infarction. No midline shift or mass effect. No hydrocephalus. Basilar cisterns are patent. Vascular: No hyperdense vessel or unexpected calcification. Skull: Normal. Negative for fracture or focal lesion. Sinuses/Orbits: Mucosal thickening in the RIGHT maxillary sinus. Prior ethmoidectomy. Other: None IMPRESSION: 1. No acute intracranial  findings. 2. No CT evidence of cortical infarction. 3. Chronic mucosal thickening in the RIGHT maxillary sinus. Electronically Signed   By: Suzy Bouchard M.D.   On: 07/21/2021 17:49    Impression: [name] is a 60 year old male with history of liver cirrhosis (previous history of alcohol abuse, but none since 1982, likely NASH) and esophageal varices with multiple previous bandings who presented to the ED last night with altered mental status. Patient is currently following with [facility] for possible acceptance onto transplant list.   Encephalopathy: A/O this morning with no signs of confusion. Ammonia 68 yesterday. No asterixis noted during exam. Patient able to provide some history and converse appropriately. Lactulose started this morning. Will continue lactulose daily with good improvement of mental status.  Anemia: ongoing, patient has been seen by hem/onc in the past and had previous bone marrow biopsy in 2021 for evaluation of anemia, was thought to be likely autoimmune related.   hgb 7.1 today s/p 1 unit PRBCs yesterday. Creatnine WNL this morning at 1.24. Patient denies any melena or overt GI bleeding at this time, however, FOBT in ED was positive and patient has significant history of esophageal varices r/t cirrhosis. Has not seen any black stools or BRBPR at home, and denies hematemesis, however, Patient will need EGD for further evaluation of anemia, as variceal bleeding cannot be ruled out. patient is followed by [facility] and had recent EGD for variceal evaluation and banding a few weeks ago. Anemia likely multifactorial given his history.  Rocephin prophylaxis for SBP will be continued at this time.  Cirrhosis: Hepatic cirrhosis with portal hypertension on US abdomen 06/21/20, diagnosis of NASH in 2021 (history of heavy alcohol use in 1982, but none since) patient was referred to [facility] for decompensated cirrhosis with/portal HTN and HE at that time.  LFTs remain WNL this morning. INR 1.3  Total bili 1.6, up from 1.3 yesterday. Albumin 2.7. AFP 2.8 in march 2022. Patient on 150 mg spironolactone daily and 48m torsemide daily at home. Nadolol 219mdaily outpatient as well, held inpatient due to hypotension. No jaundice, no overt ascites, mild edema to bilatera LEs.   Meld-Na: 1939hild Pugh: 7, Class B  Hep A antibody non reactive, Hep C antibody non reactive, Hep B core and surface antibody and antigen non reactive in Feb 2022.   Plan: Continue rocephin for SBP prophylaxis Continue to monitor mental status related to HE, ammonia repeat tomorrow morning Continue lactulose daily Continue to Trend H&H Continue to trend LFTs Monitor INR  Continue  octreotide infusion Continue PPI infusion Plan for EGD when hemoglobin stable (8 or better) Transfuse as necessary per hospitalist orders Continue to hold nadalol due to hypotension  Indications, risks and benefits of procedure discussed in detail with patient. Patient verbalized understanding and is in agreement to proceed with EGD at this time.     LOS: 1 day    07/22/2021, 10:03 AM  Regnald Bowens L. Alver Sorrow, MSN, APRN, AGNP-C Adult-Gerontology Nurse Practitioner [facility]

## 2021-07-22 NOTE — TOC Initial Note (Signed)
Transition of Care [facility]) - Initial/Assessment Note    Patient Details  Name: [name] MRN: [id number] Date of Birth: 12/07/1961  Transition of Care [facility]) CM/SW Contact:    Iona Beard, [place] Phone Number: 07/22/2021, 11:56 AM  Clinical Narrative:                 Pt is high risk for readmission. CSW spoke with pts wife Ryson Bacha to complete assessment. Pt lives with his wife. Pt is able to complete ADLs independently. Pt is able to provide his own transportation. Pt has had [place] services in the past, pt states it was [place]. Pt has a walker to use when needed, but is not currently using it. TOC to follow.   Expected Discharge Plan: [place] Barriers to Discharge: Continued Medical Work up   Patient Goals and CMS Choice Patient states their goals for this hospitalization and ongoing recovery are:: Return home CMS Medicare.gov Compare Post Acute Care list provided to:: Patient Represenative (must comment) Choice offered to / list presented to : Patient, Spouse  Expected Discharge Plan and Services Expected Discharge Plan: [place] In-house Referral: Clinical Social Work Discharge Planning Services: CM Consult   Living arrangements for the past 2 months: [place]                                      Prior Living Arrangements/Services Living arrangements for the past 2 months: Single Family Home Lives with:: Spouse Patient language and need for interpreter reviewed:: Yes Do you feel safe going back to the place where you live?: Yes      Need for Family Participation in Patient Care: Yes (Comment) Care giver support system in place?: Yes (comment) Current home services: DME (Cane, walker, and potty chair) Criminal Activity/Legal Involvement Pertinent to Current Situation/Hospitalization: No - Comment as needed  Activities of Daily Living Home Assistive Devices/Equipment: None ADL Screening  (condition at time of admission) Patient's cognitive ability adequate to safely complete daily activities?: Yes Is the patient deaf or have difficulty hearing?: No Does the patient have difficulty seeing, even when wearing glasses/contacts?: No Does the patient have difficulty concentrating, remembering, or making decisions?: No Patient able to express need for assistance with ADLs?: Yes Does the patient have difficulty dressing or bathing?: No Independently performs ADLs?: Yes (appropriate for developmental age) Does the patient have difficulty walking or climbing stairs?: No Weakness of Legs: None Weakness of Arms/Hands: None  Permission Sought/Granted                  Emotional Assessment Appearance:: Appears stated age Attitude/Demeanor/Rapport: Unable to Assess Affect (typically observed): Unable to Assess Orientation: : Fluctuating Orientation (Suspected and/or reported [place]) Alcohol / Substance Use: Not Applicable Psych Involvement: No (comment)  Admission diagnosis:  GI bleed [K92.2] Cirrhosis of liver without ascites, unspecified hepatic cirrhosis type ([place]) [K74.60] Gastrointestinal hemorrhage, unspecified gastrointestinal hemorrhage type [K92.2] Patient Active Problem List   Diagnosis Date Noted   Acute metabolic encephalopathy 76/28/3151   Chronic kidney disease 07/21/2021   Pure hypercholesterolemia 07/21/2021   GI bleed 07/21/2021   Iron deficiency anemia 07/15/2021   Hyperkalemia 03/17/2021   Elevated troponin 03/17/2021   Back pain 03/17/2021   Hypertensive heart and kidney disease with heart failure and with chronic kidney disease stage III ([place]) 12/17/2020   Chronic diastolic heart failure (  [place]) 12/17/2020   CKD stage 3 due to type 2 diabetes mellitus ([place]) 12/17/2020   Hyperlipidemia associated with type 2 diabetes mellitus ([place]) 12/17/2020   Anxiety 12/17/2020   Chronic non-seasonal allergic rhinitis 12/17/2020   Hypotension 12/10/2020   Acute  renal failure superimposed on stage 3b chronic kidney disease ([place]) 12/10/2020   Acute renal failure superimposed on stage 3b chronic kidney disease, unspecified acute renal failure type ([place]) 12/10/2020   AKI (acute kidney injury) ([place]) 12/09/2020   GERD (gastroesophageal reflux disease) 10/09/2020   Portal hypertension ([place]) 08/26/2020   Pre-transplant evaluation for liver transplant 08/26/2020   Atherosclerotic heart disease of native coronary artery without angina pectoris 07/17/2020   Unspecified cirrhosis of liver ([place]) 07/17/2020   Anasarca 07/17/2020   Heart failure, unspecified ([place]) 07/17/2020   Morbid obesity ([place]) 07/17/2020   Acute on chronic heart failure with preserved ejection fraction (HCC)    Liver failure ([place]) 06/20/2020   Esophageal varices ([place]) 01/09/2020   History of adenomatous polyp of colon 01/09/2020   Elevated AST (SGOT) 08/23/2019   Elevation of level of transaminase and lactic acid dehydrogenase (LDH) 08/23/2019   Diarrhea 07/17/2019   Hypersomnia, persistent 03/12/2019   Macrocytic anemia 01/18/2019   Other pancytopenia ([place]) 01/18/2019   Anemia 01/18/2019   Thrombocytopenia ([place]) 12/30/2018   Polyclonal gammopathy 12/30/2018   S/P total knee replacement, left 03/09/17 11/22/2017   Primary osteoarthritis of left knee 03/09/2017   History of nocturia 03/02/2017   OSA on CPAP 03/02/2016   Hypoxia 03/02/2016   Obstructive sleep apnea (adult) (pediatric) 03/02/2016   Cyst of mediastinum 01/15/2016   S/P CABG x 4 08/29/2015   Obesity, Class III, BMI 40-49.9 (morbid obesity) ([place])    Hyperglycemia    Pain in the chest 08/23/2015   Non-ST elevation MI (NSTEMI) ([place]) 08/23/2015   Diabetes mellitus type 2, uncontrolled ([place]) 08/23/2015   Essential hypertension 08/23/2015   Hyperlipidemia 08/23/2015   Type 2 diabetes mellitus with hypoglycemia with coma ([place]) 08/23/2015   Myocardial infarction ([place]) 08/23/2015   Type 2 diabetes mellitus with hyperglycemia  ([place]) 2016   Class 2 obesity 2016   Hyponatremia 2016   Osteoarthritis, knee 05/24/2012   Knee pain 10/29/2011   Knee stiffness 10/29/2011   S/P right knee arthroscopy 10/26/2011   Medial meniscus, posterior horn derangement 09/07/2011   Lateral meniscus derangement 09/07/2011   OA (osteoarthritis) of knee 09/07/2011   Rotator cuff syndrome of left shoulder 08/19/2011   Right knee meniscal tear 08/19/2011   PCP:  [name], MD Pharmacy:   [facility], [place] [address] Phone: [phone] Fax: [place], [place] - [address] AT [place] [place] [address] Phone: [phone] Fax: [phone]     Social Determinants of Health (SDOH) Interventions    Readmission Risk Interventions Readmission Risk Prevention Plan 07/22/2021 03/24/2021 12/10/2020  Transportation Screening Complete Complete Complete  HRI or Home Care Consult Complete - Complete  Social Work Consult for [place] Planning/Counseling Complete - Complete  Palliative Care Screening Not Applicable - Not Applicable  Medication Review Press photographer) Complete Complete Complete  PCP or Specialist appointment within 3-5 days of discharge - Complete -  [place] or [place] - Complete -  SW Recovery Care/Counseling Consult - Complete -  [place] -  Not Applicable -  Some recent data might be hidden

## 2021-07-22 NOTE — Plan of Care (Signed)
  Problem: Education: Goal: Knowledge of General Education information will improve Description: Including pain rating scale, medication(s)/side effects and non-pharmacologic comfort measures Outcome: Not Progressing   Problem: Health Behavior/Discharge Planning: Goal: Ability to manage health-related needs will improve Outcome: Progressing   Problem: Clinical Measurements: Goal: Ability to maintain clinical measurements within normal limits will improve Outcome: Progressing Goal: Will remain free from infection Outcome: Progressing Goal: Diagnostic test results will improve Outcome: Progressing Goal: Respiratory complications will improve Outcome: Progressing Goal: Cardiovascular complication will be avoided Outcome: Progressing   Problem: Activity: Goal: Risk for activity intolerance will decrease Outcome: Progressing   Problem: Nutrition: Goal: Adequate nutrition will be maintained Outcome: Not Progressing   Problem: Coping: Goal: Level of anxiety will decrease Outcome: Progressing   Problem: Elimination: Goal: Will not experience complications related to bowel motility Outcome: Progressing Goal: Will not experience complications related to urinary retention Outcome: Progressing   Problem: Pain Managment: Goal: General experience of comfort will improve Outcome: Progressing   Problem: Safety: Goal: Ability to remain free from injury will improve Outcome: Progressing   Problem: Skin Integrity: Goal: Risk for impaired skin integrity will decrease Outcome: Progressing   Problem: Education: Goal: Ability to demonstrate management of disease process will improve Outcome: Not Progressing Goal: Ability to verbalize understanding of medication therapies will improve Outcome: Progressing Goal: Individualized Educational Video(s) Outcome: Not Progressing   Problem: Activity: Goal: Capacity to carry out activities will improve Outcome: Not Progressing   Problem:  Cardiac: Goal: Ability to achieve and maintain adequate cardiopulmonary perfusion will improve Outcome: Progressing

## 2021-07-23 ENCOUNTER — Encounter (HOSPITAL_COMMUNITY): Payer: Self-pay | Admitting: Family Medicine

## 2021-07-23 ENCOUNTER — Inpatient Hospital Stay (HOSPITAL_COMMUNITY): Payer: Federal, State, Local not specified - PPO | Admitting: Anesthesiology

## 2021-07-23 ENCOUNTER — Encounter (HOSPITAL_COMMUNITY)
Admission: EM | Disposition: A | Discharge status: Home / Self Care | Payer: Self-pay | Source: Home / Self Care | Attending: Internal Medicine

## 2021-07-23 DIAGNOSIS — K766 Portal hypertension: Secondary | ICD-10-CM | POA: Diagnosis not present

## 2021-07-23 DIAGNOSIS — D649 Anemia, unspecified: Secondary | ICD-10-CM | POA: Diagnosis not present

## 2021-07-23 DIAGNOSIS — K31819 Angiodysplasia of stomach and duodenum without bleeding: Secondary | ICD-10-CM

## 2021-07-23 DIAGNOSIS — I8510 Secondary esophageal varices without bleeding: Secondary | ICD-10-CM | POA: Diagnosis not present

## 2021-07-23 DIAGNOSIS — K922 Gastrointestinal hemorrhage, unspecified: Secondary | ICD-10-CM | POA: Diagnosis not present

## 2021-07-23 DIAGNOSIS — K3189 Other diseases of stomach and duodenum: Secondary | ICD-10-CM | POA: Diagnosis not present

## 2021-07-23 DIAGNOSIS — K7460 Unspecified cirrhosis of liver: Secondary | ICD-10-CM | POA: Diagnosis not present

## 2021-07-23 DIAGNOSIS — D62 Acute posthemorrhagic anemia: Secondary | ICD-10-CM | POA: Diagnosis not present

## 2021-07-23 HISTORY — PX: ESOPHAGEAL BANDING: SHX5518

## 2021-07-23 HISTORY — PX: ESOPHAGOGASTRODUODENOSCOPY (EGD) WITH PROPOFOL: SHX5813

## 2021-07-23 LAB — BPAM RBC
Blood Product Expiration Date: 202209032359
ISSUE DATE / TIME: 202208100118
Unit Type and Rh: 6200

## 2021-07-23 LAB — CBC
HCT: 26.9 % — ABNORMAL LOW (ref 39.0–52.0)
Hemoglobin: 7.5 g/dL — ABNORMAL LOW (ref 13.0–17.0)
MCH: 26.6 pg (ref 26.0–34.0)
MCHC: 27.9 g/dL — ABNORMAL LOW (ref 30.0–36.0)
MCV: 95.4 fL (ref 80.0–100.0)
Platelets: 81 10*3/uL — ABNORMAL LOW (ref 150–400)
RBC: 2.82 MIL/uL — ABNORMAL LOW (ref 4.22–5.81)
RDW: 17.5 % — ABNORMAL HIGH (ref 11.5–15.5)
WBC: 6 10*3/uL (ref 4.0–10.5)
nRBC: 0 % (ref 0.0–0.2)

## 2021-07-23 LAB — GLUCOSE, CAPILLARY
Glucose-Capillary: 123 mg/dL — ABNORMAL HIGH (ref 70–99)
Glucose-Capillary: 102 mg/dL — ABNORMAL HIGH (ref 70–99)
Glucose-Capillary: 112 mg/dL — ABNORMAL HIGH (ref 70–99)
Glucose-Capillary: 107 mg/dL — ABNORMAL HIGH (ref 70–99)
Glucose-Capillary: 96 mg/dL (ref 70–99)
Glucose-Capillary: 148 mg/dL — ABNORMAL HIGH (ref 70–99)

## 2021-07-23 LAB — COMPREHENSIVE METABOLIC PANEL
ALT: 14 U/L (ref 0–44)
AST: 28 U/L (ref 15–41)
Albumin: 2.7 g/dL — ABNORMAL LOW (ref 3.5–5.0)
Alkaline Phosphatase: 54 U/L (ref 38–126)
Anion gap: 7 (ref 5–15)
BUN: 23 mg/dL (ref 8–23)
CO2: 24 mmol/L (ref 22–32)
Calcium: 8.1 mg/dL — ABNORMAL LOW (ref 8.9–10.3)
Chloride: 102 mmol/L (ref 98–111)
Creatinine, Ser: 1.28 mg/dL — ABNORMAL HIGH (ref 0.61–1.24)
GFR, Estimated: >60 mL/min (ref >60)
Glucose, Bld: 178 mg/dL — ABNORMAL HIGH (ref 70–99)
Potassium: 4.2 mmol/L (ref 3.5–5.1)
Sodium: 133 mmol/L — ABNORMAL LOW (ref 135–145)
Total Bilirubin: 1 mg/dL (ref 0.3–1.2)
Total Protein: 5.8 g/dL — ABNORMAL LOW (ref 6.5–8.1)

## 2021-07-23 LAB — TYPE AND SCREEN
ABO/RH(D): A POS
Antibody Screen: NEGATIVE
Unit division: 0

## 2021-07-23 LAB — PROTIME-INR
INR: 1.3 — ABNORMAL HIGH (ref 0.8–1.2)
Prothrombin Time: 16.2 seconds — ABNORMAL HIGH (ref 11.4–15.2)

## 2021-07-23 LAB — AMMONIA: Ammonia: 27 umol/L (ref 9–35)

## 2021-07-23 SURGERY — ESOPHAGOGASTRODUODENOSCOPY (EGD) WITH PROPOFOL
Anesthesia: General

## 2021-07-23 MED ORDER — SODIUM CHLORIDE 0.9 % IV SOLN: INTRAVENOUS | Status: DC

## 2021-07-23 MED ORDER — STERILE WATER FOR IRRIGATION IR SOLN
Status: DC | PRN
  Administered 2021-07-23: 100 mL

## 2021-07-23 MED ORDER — LIDOCAINE HCL (CARDIAC) PF 100 MG/5ML IV SOSY
PREFILLED_SYRINGE | INTRAVENOUS | Status: DC | PRN
  Administered 2021-07-23: 60 mg via INTRAVENOUS

## 2021-07-23 MED ORDER — PROPOFOL 10 MG/ML IV BOLUS
INTRAVENOUS | Status: DC | PRN
  Administered 2021-07-23: 30 mg via INTRAVENOUS
  Administered 2021-07-23: 40 mg via INTRAVENOUS
  Administered 2021-07-23: 100 mg via INTRAVENOUS
  Administered 2021-07-23: 30 mg via INTRAVENOUS

## 2021-07-23 MED ORDER — LACTATED RINGERS IV SOLN
INTRAVENOUS | Status: DC
  Administered 2021-07-23: 1000 mL via INTRAVENOUS

## 2021-07-23 NOTE — Progress Notes (Signed)
Subjective: Feeling well today.  Mental status is back to normal.  Denies BRBPR, melena, abdominal pain, nausea, vomiting.  No increased abdominal distention.  Had a little swelling in his leg which is chronic and at baseline.  No BMs yesterday that he only had clear liquids.  Usually, he will have about 6 BMs per day.  He did recently complete course of antibiotics about 1 week ago due to congestion  Objective: Vital signs in last 24 hours: Temp:  [97 F (36.1 C)-98.2 F (36.8 C)] 97.6 F (36.4 C) (08/11 0431) Pulse Rate:  [67-84] 71 (08/11 0700) Resp:  [13-19] 18 (08/10 2300) BP: (76-136)/(30-93) 114/93 (08/11 0700) SpO2:  [92 %-100 %] 96 % (08/11 0700) Weight:  [125.4 kg] 125.4 kg (08/11 0431) Last BM Date: 07/21/21 General:   Alert and oriented, pleasant, no acute distress. Head:  Normocephalic and atraumatic. Eyes:  No icterus, sclera clear. Conjuctiva pink.  Abdomen:  Bowel sounds present.  Obese abdomen that is soft and nontender to palpation.  No rebound or guarding. No masses appreciated  Msk:  Symmetrical without gross deformities. Normal posture. Extremities:  With 1+ ankle edema. Neurologic:  Alert and  oriented x4;  grossly normal neurologically.  No asterixis. Skin:  Warm and dry, intact without significant lesions.  Psych:  Normal mood and affect.  Intake/Output from previous day: 08/10 0701 - 08/11 0700 In: 915.7 [I.V.:615.7; IV Piggyback:300] Out: 1 [Urine:1] Intake/Output this shift: No intake/output data recorded.  Lab Results: Recent Labs    07/21/21 1644 07/22/21 0623 07/23/21 0426  WBC 6.9 4.6 6.0  HGB 6.7* 7.1* 7.5*  HCT 23.8* 25.2* 26.9*  PLT 95* 78* 81*   BMET Recent Labs    07/21/21 1644 07/22/21 0623 07/23/21 0426  NA 132* 135 133*  K 3.4* 4.3 4.2  CL 98 102 102  CO2 26 27 24   GLUCOSE 297* 149* 178*  BUN 26* 22 23  CREATININE 1.29* 1.24 1.28*  CALCIUM 8.1* 8.1* 8.1*   LFT Recent Labs    07/21/21 1644 07/22/21 0623  07/23/21 0426  PROT 6.4* 5.8* 5.8*  ALBUMIN 3.0* 2.7* 2.7*  AST 26 25 28   ALT 14 13 14   ALKPHOS 70 56 54  BILITOT 1.3* 1.6* 1.0   PT/INR Recent Labs    07/22/21 0623 07/23/21 0426  LABPROT 16.3* 16.2*  INR 1.3* 1.3*    Studies/Results: CT HEAD WO CONTRAST (5MM)  Result Date: 07/21/2021 CLINICAL DATA:  Slurred speech. Concern for cerebrovascular accident EXAM: CT HEAD WITHOUT CONTRAST TECHNIQUE: Contiguous axial images were obtained from the base of the skull through the vertex without intravenous contrast. COMPARISON:  07/08/2016 FINDINGS: Brain: No acute intracranial hemorrhage. No focal mass lesion. No CT evidence of acute infarction. No midline shift or mass effect. No hydrocephalus. Basilar cisterns are patent. Vascular: No hyperdense vessel or unexpected calcification. Skull: Normal. Negative for fracture or focal lesion. Sinuses/Orbits: Mucosal thickening in the RIGHT maxillary sinus. Prior ethmoidectomy. Other: None IMPRESSION: 1. No acute intracranial findings. 2. No CT evidence of cortical infarction. 3. Chronic mucosal thickening in the RIGHT maxillary sinus. Electronically Signed   By: Suzy Bouchard M.D.   On: 07/21/2021 17:49    Assessment: 60 year old male with history of liver cirrhosis (previous history of alcohol abuse, but none since 1982, likely NASH), esophageal varices with multiple previous bandings, GAVE who presented to the ED 8/9 with altered mental status. Patient is currently followed at [facility] for consideration of transplant.   Hepatic  encephalopathy: Resolved. This may been provoked by subacute GI bleeding, urinary tract infection, and recent respiratory illness (outpatient).  Continue lactulose. Titrate to 3-4 BMs daily.   Acute on chronic anemia: Likely subacute GI bleed, possibly secondary to severe GAVE. Less likely variceal bleed. No reports of BRBPR or melena.  Hemoglobin 6.7 on admission, down from 8.3, 8 days prior.  Received 1 unit PRBCs.  Hemoglobin  up to 7.5 this morning. INR stable at 1.3. Considering acute decline in hemoglobin, we have reevaluation of his upper GI tract this admission.  Considering history of esophageal varices, we can perform banding as appropriate.  Last banding on 06/18/2021 at [facility]. He remains on octreotide and pantoprazole infusion as well as ceftriaxone for SBP prophylaxis and UTI.   Cirrhosis: Secondary to NASH (history of heavy alcohol use, but one since 1982). Complicated by esophageal varices, GAVE, HE. Following with [facility] for consideration of transplant. MELD 16. Chronically on lactulose, 50 mg spironolactone BID, 20mg  torsemide daily, Nadolol 20mg  daily which is on hold this admission due to hypotension. Clinically stable today without jaundice, overt ascites, or HE. Minimal bilateral LE edema.    Plan: EGD with possible esophageal variceal banding with propofol with Dr. Abbey Chatters today.  Continue Protonix and octreotide.  Continue Rocephin.  Continue lactulose. Titrate to 3-4 Bms daily. May need to increase dose if he doesn't have Bms today.  Monitor for mental status changes.  Continue to follow H/H and transfuse as necessary.  Continue to monitor LFTs and INR daily.  Continue to hold nadolol for now.    LOS: 2 days    07/23/2021, 7:32 AM   Aliene Altes, PA-C [facility]

## 2021-07-23 NOTE — Interval H&P Note (Signed)
History and Physical Interval Note:  07/23/2021 10:39 AM  [name]  has presented today for surgery, with the diagnosis of acute on chronic anemia, history of esophageal varices s/p banding, last banding 06/18/21..  The various methods of treatment have been discussed with the patient and family. After consideration of risks, benefits and other options for treatment, the patient has consented to  Procedure(s): ESOPHAGOGASTRODUODENOSCOPY (EGD) WITH PROPOFOL (N/A) ESOPHAGEAL BANDING (N/A) as a surgical intervention.  The patient's history has been reviewed, patient examined, no change in status, stable for surgery.  I have reviewed the patient's chart and labs.  Questions were answered to the patient's satisfaction.     [name]

## 2021-07-23 NOTE — Transfer of Care (Signed)
Immediate Anesthesia Transfer of Care Note  Patient: [name]  Procedure(s) Performed: ESOPHAGOGASTRODUODENOSCOPY (EGD) WITH PROPOFOL ESOPHAGEAL BANDING  Patient Location: PACU  Anesthesia Type:General  Level of Consciousness: awake  Airway & Oxygen Therapy: Patient Spontanous Breathing and Patient connected to nasal cannula oxygen  Post-op Assessment: Report given to RN and Post -op Vital signs reviewed and stable  Post vital signs: Reviewed and stable  Last Vitals:  Vitals Value Taken Time  BP 141/70 07/23/21 1105  Temp    Pulse 74 07/23/21 1107  Resp    SpO2 100 % 07/23/21 1107  Vitals shown include unvalidated device data.  Last Pain:  Vitals:   07/23/21 1047  TempSrc:   PainSc: 0-No pain      Patients Stated Pain Goal: 7 (14/44/58 4835)  Complications: No notable events documented.

## 2021-07-23 NOTE — Plan of Care (Signed)

## 2021-07-23 NOTE — Progress Notes (Signed)
PROGRESS NOTE    [name]  UXL:[id number] DOB: 07-May-1961 DOA: 07/21/2021 PCP: [name], MD    Brief Narrative:  60 y.o. male, with history of anemia, coronary artery disease, cirrhosis currently undergoing evaluation to be on transplant list, CKD, diastolic CHF, essential hypertension, hyperlipidemia, type 2 diabetes mellitus, and more presents the ED with a chief complaint of altered mental status. Of note, pt recently had multiple esophageal varices banded. Presented with hgb of 6.7 with ammonia of 68. Pt was admitted for concerns of acute blood loss anemia  Assessment & Plan:   Active Problems:   Diabetes mellitus type 2, uncontrolled (HCC)   Acute on chronic heart failure with preserved ejection fraction (HCC)   Acute renal failure superimposed on stage 3b chronic kidney disease (HCC)   Anemia   GI bleed   Acute metabolic encephalopathy    GI bleeding and symptomatic anemia With history of banded varices GI consulted and pt underwent EGD 8/11 with findings of grade 2 esophageal varices with no stigmata of recent bleed, banded GI recs for BID PO PPI x 8 weeks and carafate 1g before every meal and nightly Repeat EGD in 4 weeks for retreatment Continue to follow CBC trends and transfuse for hgb <7 Acute metabolic encephalopathy Multifactorial Suspect possible UTI, hepatic encephalopahty Treat as below Cont abx and lactulose CT head shows no acute changes Mentation has improved Elevated ammonia In setting of cirrhosis Continue lactulose Lactulose down to 27 CKD stage III Renal function seems to be at baseline Continue to monitor Diabetes mellitus type 2 Monitor CBG every 6 hours Sliding scale coverage Continue to monitor Possible UTI UA is borderline suspicious for UTI Urine culture pending Continue Rocephin that has already been started for GI bleed will cover UTI Continue to monitor Adrenal insufficiency Solu-Cortef given in the ED Continue  Solu-Cortef BP presently stable and controlled Hypokalemia Corrected Repeat bmet in AM  Hypertension BP meds remain on hold secondary to soft pressures seen at time of presentation Would gradually resume bp meds as bp allows   DVT prophylaxis: SCD's Code Status: Full Family Communication: Pt in room, family not at bedside  Status is: Inpatient  Remains inpatient appropriate because:Inpatient level of care appropriate due to severity of illness  Dispo: The patient is from: Home              Anticipated d/c is to: Home              Patient currently is not medically stable to d/c.   Difficult to place patient No   Consultants:  GI  Procedures:    Antimicrobials: Anti-infectives (From admission, onward)    Start     Dose/Rate Route Frequency Ordered Stop   07/22/21 2100  cefTRIAXone (ROCEPHIN) 2 g in sodium chloride 0.9 % 100 mL IVPB        2 g 200 mL/hr over 30 Minutes Intravenous Every 24 hours 07/21/21 2159     07/22/21 2030  cefTRIAXone (ROCEPHIN) 1 g in sodium chloride 0.9 % 100 mL IVPB  Status:  Discontinued        1 g 200 mL/hr over 30 Minutes Intravenous Every 24 hours 07/21/21 2128 07/21/21 2159   07/21/21 2030  cefTRIAXone (ROCEPHIN) 1 g in sodium chloride 0.9 % 100 mL IVPB        1 g 200 mL/hr over 30 Minutes Intravenous  Once 07/21/21 2025 07/21/21 2158       Subjective: No complaints this AM  Objective: Vitals:   07/23/21 1145 07/23/21 1300 07/23/21 1400 07/23/21 1500  BP: 128/65 137/68 (!) 116/57 121/66  Pulse:  69 69 65  Resp: 16 (!) 21 18 17   Temp: 97.8 F (36.6 C)     TempSrc:      SpO2: 100% 100% 99% 93%  Weight:      Height:        Intake/Output Summary (Last 24 hours) at 07/23/2021 1629 Last data filed at 07/23/2021 1108 Gross per 24 hour  Intake 972.82 ml  Output 850 ml  Net 122.82 ml    Filed Weights   07/21/21 1645 07/22/21 0032 07/23/21 0431  Weight: 120.2 kg 124.1 kg 125.4 kg    Examination: General exam: Conversant, in  no acute distress Respiratory system: normal chest rise, clear, no audible wheezing Cardiovascular system: regular rhythm, s1-s2 Gastrointestinal system: Nondistended, nontender, pos BS Central nervous system: No seizures, no tremors Extremities: No cyanosis, no joint deformities Skin: No rashes, no pallor Psychiatry: Affect normal // no auditory hallucinations   Data Reviewed: I have personally reviewed following labs and imaging studies  CBC: Recent Labs  Lab 07/21/21 1644 07/22/21 0623 07/23/21 0426  WBC 6.9 4.6 6.0  NEUTROABS 5.0  --   --   HGB 6.7* 7.1* 7.5*  HCT 23.8* 25.2* 26.9*  MCV 91.9 92.6 95.4  PLT 95* 78* 81*    Basic Metabolic Panel: Recent Labs  Lab 07/21/21 1644 07/22/21 0623 07/23/21 0426  NA 132* 135 133*  K 3.4* 4.3 4.2  CL 98 102 102  CO2 26 27 24   GLUCOSE 297* 149* 178*  BUN 26* 22 23  CREATININE 1.29* 1.24 1.28*  CALCIUM 8.1* 8.1* 8.1*  MG  --  2.2  --     GFR: Estimated Creatinine Clearance: 80.6 mL/min (A) (by C-G formula based on SCr of 1.28 mg/dL (H)). Liver Function Tests: Recent Labs  Lab 07/21/21 1644 07/22/21 0623 07/23/21 0426  AST 26 25 28   ALT 14 13 14   ALKPHOS 70 56 54  BILITOT 1.3* 1.6* 1.0  PROT 6.4* 5.8* 5.8*  ALBUMIN 3.0* 2.7* 2.7*    No results for input(s): LIPASE, AMYLASE in the last 168 hours. Recent Labs  Lab 07/21/21 1644 07/23/21 0426  AMMONIA 68* 27    Coagulation Profile: Recent Labs  Lab 07/21/21 1644 07/22/21 0623 07/23/21 0426  INR 1.2 1.3* 1.3*    Cardiac Enzymes: No results for input(s): CKTOTAL, CKMB, CKMBINDEX, TROPONINI in the last 168 hours. BNP (last 3 results) No results for input(s): PROBNP in the last 8760 hours. HbA1C: Recent Labs    07/22/21 0623  HGBA1C 6.7*    CBG: Recent Labs  Lab 07/22/21 2211 07/23/21 0801 07/23/21 1027 07/23/21 1117 07/23/21 1212  GLUCAP 191* 123* 102* 112* 107*    Lipid Profile: No results for input(s): CHOL, HDL, LDLCALC, TRIG,  CHOLHDL, LDLDIRECT in the last 72 hours. Thyroid Function Tests: No results for input(s): TSH, T4TOTAL, FREET4, T3FREE, THYROIDAB in the last 72 hours. Anemia Panel: No results for input(s): VITAMINB12, FOLATE, FERRITIN, TIBC, IRON, RETICCTPCT in the last 72 hours. Sepsis Labs: No results for input(s): PROCALCITON, LATICACIDVEN in the last 168 hours.  Recent Results (from the past 240 hour(s))  Resp Panel by RT-PCR (Flu A&B, Covid) Nasopharyngeal Swab     Status: None   Collection Time: 07/21/21  9:21 PM   Specimen: Nasopharyngeal Swab; Nasopharyngeal(NP) swabs in vial transport medium  Result Value Ref Range Status   SARS Coronavirus 2 by  RT PCR NEGATIVE NEGATIVE Final    Comment: (NOTE) SARS-CoV-2 target nucleic acids are NOT DETECTED.  The SARS-CoV-2 RNA is generally detectable in upper respiratory specimens during the acute phase of infection. The lowest concentration of SARS-CoV-2 viral copies this assay can detect is 138 copies/mL. A negative result does not preclude SARS-Cov-2 infection and should not be used as the sole basis for treatment or other patient management decisions. A negative result may occur with  improper specimen collection/handling, submission of specimen other than nasopharyngeal swab, presence of viral mutation(s) within the areas targeted by this assay, and inadequate number of viral copies(<138 copies/mL). A negative result must be combined with clinical observations, patient history, and epidemiological information. The expected result is Negative.  Fact Sheet for Patients:  EntrepreneurPulse.com.au  Fact Sheet for Healthcare Providers:  IncredibleEmployment.be  This test is no t yet approved or cleared by the [place] FDA and  has been authorized for detection and/or diagnosis of SARS-CoV-2 by FDA under an Emergency Use Authorization (EUA). This EUA will remain  in effect (meaning this test can be used) for  the duration of the COVID-19 declaration under Section 564(b)(1) of the Act, 21 U.S.C.section 360bbb-3(b)(1), unless the authorization is terminated  or revoked sooner.       Influenza A by PCR NEGATIVE NEGATIVE Final   Influenza B by PCR NEGATIVE NEGATIVE Final    Comment: (NOTE) The Xpert Xpress SARS-CoV-2/FLU/RSV plus assay is intended as an aid in the diagnosis of influenza from Nasopharyngeal swab specimens and should not be used as a sole basis for treatment. Nasal washings and aspirates are unacceptable for Xpert Xpress SARS-CoV-2/FLU/RSV testing.  Fact Sheet for Patients: EntrepreneurPulse.com.au  Fact Sheet for Healthcare Providers: IncredibleEmployment.be  This test is not yet approved or cleared by the [place] FDA and has been authorized for detection and/or diagnosis of SARS-CoV-2 by FDA under an Emergency Use Authorization (EUA). This EUA will remain in effect (meaning this test can be used) for the duration of the COVID-19 declaration under Section 564(b)(1) of the Act, 21 U.S.C. section 360bbb-3(b)(1), unless the authorization is terminated or revoked.  Performed at [facility], [address], [place], [place] [address]   MRSA Next Gen by PCR, Nasal     Status: None   Collection Time: 07/22/21 12:11 AM   Specimen: Nasal Mucosa; Nasal Swab  Result Value Ref Range Status   MRSA by PCR Next Gen NOT DETECTED NOT DETECTED Final    Comment: (NOTE) The GeneXpert MRSA Assay (FDA approved for NASAL specimens only), is one component of a comprehensive MRSA colonization surveillance program. It is not intended to diagnose MRSA infection nor to guide or monitor treatment for MRSA infections. Test performance is not FDA approved in patients less than 52 years old. Performed at [facility], [address], [place], [place] [address]       Radiology Studies: CT HEAD WO CONTRAST (5MM)  Result Date: 07/21/2021 CLINICAL DATA:   Slurred speech. Concern for cerebrovascular accident EXAM: CT HEAD WITHOUT CONTRAST TECHNIQUE: Contiguous axial images were obtained from the base of the skull through the vertex without intravenous contrast. COMPARISON:  07/08/2016 FINDINGS: Brain: No acute intracranial hemorrhage. No focal mass lesion. No CT evidence of acute infarction. No midline shift or mass effect. No hydrocephalus. Basilar cisterns are patent. Vascular: No hyperdense vessel or unexpected calcification. Skull: Normal. Negative for fracture or focal lesion. Sinuses/Orbits: Mucosal thickening in the RIGHT maxillary sinus. Prior ethmoidectomy. Other: None IMPRESSION: 1. No acute  intracranial findings. 2. No CT evidence of cortical infarction. 3. Chronic mucosal thickening in the RIGHT maxillary sinus. Electronically Signed   By: Suzy Bouchard M.D.   On: 07/21/2021 17:49    Scheduled Meds:  Chlorhexidine Gluconate Cloth  6 each Topical Daily   hydrocortisone sod succinate (SOLU-CORTEF) inj  100 mg Intravenous Q12H   insulin aspart  0-15 Units Subcutaneous TID WC   insulin aspart  0-5 Units Subcutaneous QHS   insulin detemir  30 Units Subcutaneous QHS   lactulose  20 g Oral Daily   loratadine  10 mg Oral q AM   midodrine  10 mg Oral TID AC   pravastatin  20 mg Oral q1800   venlafaxine XR  37.5 mg Oral Daily   Continuous Infusions:  cefTRIAXone (ROCEPHIN)  IV Stopped (07/22/21 2142)   octreotide  (SANDOSTATIN)    IV infusion 50 mcg/hr (07/23/21 1046)   pantoprazole 8 mg/hr (07/23/21 1326)     LOS: 2 days   Marylu Lund, MD [facility] Pager On Amion  If 7PM-7AM, please contact night-coverage 07/23/2021, 4:29 PM

## 2021-07-23 NOTE — Progress Notes (Signed)
Patient O2 sat on room air to drop to 68% then fluctuate back up to the 90's then back down in to the 70's. Upon assessment, patient noted to be sleeping, easily awakens when calling out name. Patient stated he has sleep apnea and uses CPAP at home. Dr Wyline Copas aware and in to assess and placed orders for Cpap. Patient placed on 2 Liters O2 due to O2 dropping while asleep until Cpap comes from Respiratory. Dr Wyline Copas aware.

## 2021-07-23 NOTE — Op Note (Signed)
[facility] Patient Name: [name] Procedure Date: 07/23/2021 10:30 AM MRN: [id number] Date of Birth: 08-Aug-1961 Attending MD: Elon Alas. Abbey Chatters DO CSN: [id number] Age: 60 Admit Type: Inpatient Procedure:                Upper GI endoscopy Indications:              Acute post hemorrhagic anemia Providers:                Elon Alas. Abbey Chatters, DO, Tammy Vaught, RN, Dereck Leep, Technician Referring MD:              Medicines:                See the Anesthesia note for documentation of the                            administered medications Complications:            No immediate complications. Estimated Blood Loss:     Estimated blood loss was minimal. Estimated blood                            loss: none. Procedure:                Pre-Anesthesia Assessment:                           - The anesthesia plan was to use monitored                            anesthesia care (MAC).                           After obtaining informed consent, the endoscope was                            passed under direct vision. Throughout the                            procedure, the patient's blood pressure, pulse, and                            oxygen saturations were monitored continuously. The                            [id number]-H190 ([id number]) scope was introduced through the                            mouth, and advanced to the second part of duodenum.                            The upper GI endoscopy was accomplished without                            difficulty. The patient tolerated  the procedure                            well. Scope In: 10:52:10 AM Scope Out: 11:01:07 AM Total Procedure Duration: 0 hours 8 minutes 57 seconds  Findings:      Two columns of grade II varices with no stigmata of recent bleeding were       found in the lower third of the esophagus,. No red wale signs were       present. Scarring from prior treatment was visible. Two bands  were       successfully placed with complete eradication, resulting in deflation of       varices. There was no bleeding during and at the end of the procedure.      Moderate portal hypertensive gastropathy was found in the gastric       fundus. Small amount of fresh blood in this region. No active oozing.      Moderate gastric antral vascular ectasia without bleeding was present in       the gastric antrum.      The duodenal bulb, first portion of the duodenum and second portion of       the duodenum were normal. Impression:               - Grade II esophageal varices with no stigmata of                            recent bleeding. Completely eradicated. Banded.                           - Portal hypertensive gastropathy.                           - Gastric antral vascular ectasia without bleeding.                           - Normal duodenal bulb, first portion of the                            duodenum and second portion of the duodenum.                           - No specimens collected. Moderate Sedation:      Per Anesthesia Care Recommendation:           - Return patient to hospital ward for ongoing care.                           - Clear liquid diet for today.                           - IV BID while inaptient then BID PO PPI for 8 weeks                           - Initiate Carafate suspension 1 g before every                            meal and  nightly starting tomorrow.                           - IV antiemetics every 6 hours as needed                           - Continue to monitor H&H and transfuse for less                            than 7                           - Repeat upper endoscopy in 4 weeks for retreatment Procedure Code(s):        --- Professional ---                           [phone], Esophagogastroduodenoscopy, flexible,                            transoral; with band ligation of esophageal/gastric                            varices Diagnosis Code(s):        ---  Professional ---                           I85.00, Esophageal varices without bleeding                           K76.6, Portal hypertension                           K31.89, Other diseases of stomach and duodenum                           K31.819, Angiodysplasia of stomach and duodenum                            without bleeding                           D62, Acute posthemorrhagic anemia CPT copyright 2019 American Medical Association. All rights reserved. The codes documented in this report are preliminary and upon coder review may  be revised to meet current compliance requirements. Elon Alas. Abbey Chatters, DO [place] Abbey Chatters, DO 07/23/2021 11:26:36 AM This report has been signed electronically. Number of Addenda: 0

## 2021-07-23 NOTE — Anesthesia Postprocedure Evaluation (Signed)
Anesthesia Post Note  Patient: [name]  Procedure(s) Performed: ESOPHAGOGASTRODUODENOSCOPY (EGD) WITH PROPOFOL ESOPHAGEAL BANDING  Patient location during evaluation: PACU Anesthesia Type: General Level of consciousness: awake and alert Pain management: pain level controlled Vital Signs Assessment: post-procedure vital signs reviewed and stable Respiratory status: spontaneous breathing, nonlabored ventilation, respiratory function stable and patient connected to nasal cannula oxygen Cardiovascular status: blood pressure returned to baseline and stable Postop Assessment: no apparent nausea or vomiting Anesthetic complications: no   No notable events documented.   Last Vitals:  Vitals:   07/23/21 1130 07/23/21 1145  BP: 127/87 128/65  Pulse: 70   Resp: 18 16  Temp:  36.6 C  SpO2: 100% 100%    Last Pain:  Vitals:   07/23/21 1130  TempSrc:   PainSc: Asleep                 Nicanor Alcon

## 2021-07-23 NOTE — Anesthesia Procedure Notes (Signed)
Date/Time: 07/23/2021 10:54 AM Performed by: Orlie Dakin, CRNA Pre-anesthesia Checklist: Patient identified, Emergency Drugs available, Suction available and Patient being monitored Patient Re-evaluated:Patient Re-evaluated prior to induction Oxygen Delivery Method: Nasal cannula Induction Type: IV induction Placement Confirmation: positive ETCO2

## 2021-07-23 NOTE — Anesthesia Preprocedure Evaluation (Signed)
Anesthesia Evaluation  Patient identified by MRN, date of birth, ID band Patient awake    Reviewed: Allergy & Precautions, NPO status , Patient's Chart, lab work & pertinent test results, reviewed documented beta blocker date and time   Airway Mallampati: III  TM Distance: >3 FB Neck ROM: Full    Dental  (+) Missing, Dental Advisory Given   Pulmonary sleep apnea and Continuous Positive Airway Pressure Ventilation ,    Pulmonary exam normal breath sounds clear to auscultation       Cardiovascular Exercise Tolerance: Good METS: 3 - Mets hypertension, Pt. on medications (-) angina+ CAD, + Past MI and + CABG  Normal cardiovascular exam Rhythm:Regular Rate:Normal  History:   PMH:  Acquired from the patient and from the patient&'s chart.  PMH:  OSA on CPAP. Non-ST elevation MI (NSTEMI) Non-ST elevation MI (NSTEMI)  Risk factors:  Hypertension. Diabetes mellitus. Dyslipidemia.  ------------------------------------------------------------------- Study Conclusions  - Left ventricle: The cavity size was normal. Wall thickness was   increased in a pattern of mild LVH. Systolic function was normal.   The estimated ejection fraction was in the range of 55% to 60%.   Wall motion was normal; there were no regional wall motion   abnormalities. Left ventricular diastolic function parameters   were normal for the patient&'s age. - Aortic valve: Moderately calcified annulus. Trileaflet; mildly   calcified leaflets. There was mild stenosis. Mean gradient (S):   10 mm Hg. Peak gradient (S): 18 mm Hg. Valve area (VTI): 1.68   cm^2. - Mitral valve: Mildly calcified annulus. There was trivial   regurgitation. - Right ventricle: Systolic function was low normal. - Right atrium: Central venous pressure (est): 3 mm Hg. - Atrial septum: No defect or patent foramen ovale was identified. - Tricuspid valve: There was trivial regurgitation. - Pulmonary  arteries: PA peak pressure: 9 mm Hg (S). - Pericardium, extracardiac: There was no pericardial effusion.    Neuro/Psych Anxiety    GI/Hepatic GERD  Medicated and Controlled,(+) Cirrhosis   Esophageal Varices    , Esophageal varices    Endo/Other  diabetes, Well Controlled, Type 2, Insulin Dependent, Oral Hypoglycemic Agents  Renal/GU Renal InsufficiencyRenal disease     Musculoskeletal  (+) Arthritis ,   Abdominal   Peds  Hematology  (+) anemia ,   Anesthesia Other Findings *Morbid obesity *undergoing evaluation to be on liver transplant list * Admitted with acute metabolic encephalopathy *Acute metabolic encephalopathy with history of banded varices  Reproductive/Obstetrics                             Anesthesia Physical  Anesthesia Plan  ASA: III  Anesthesia Plan: General   Post-op Pain Management:    Induction: Intravenous  PONV Risk Score and Plan: 0 and TIVA  Airway Management Planned: Nasal Cannula, Natural Airway and Simple Face Mask  Additional Equipment:   Intra-op Plan:   Post-operative Plan:   Informed Consent: I have reviewed the patients History and Physical, chart, labs and discussed the procedure including the risks, benefits and alternatives for the proposed anesthesia with the patient or authorized representative who has indicated his/her understanding and acceptance.     Dental advisory given  Plan Discussed with: CRNA and Surgeon  Anesthesia Plan Comments:         Anesthesia Quick Evaluation

## 2021-07-23 NOTE — Progress Notes (Signed)
Urine culture obtained and sent to lab.

## 2021-07-24 ENCOUNTER — Encounter (HOSPITAL_COMMUNITY): Payer: Self-pay | Admitting: Family Medicine

## 2021-07-24 DIAGNOSIS — K922 Gastrointestinal hemorrhage, unspecified: Secondary | ICD-10-CM | POA: Diagnosis not present

## 2021-07-24 DIAGNOSIS — I5033 Acute on chronic diastolic (congestive) heart failure: Secondary | ICD-10-CM | POA: Diagnosis not present

## 2021-07-24 DIAGNOSIS — K7460 Unspecified cirrhosis of liver: Secondary | ICD-10-CM

## 2021-07-24 DIAGNOSIS — D649 Anemia, unspecified: Secondary | ICD-10-CM

## 2021-07-24 DIAGNOSIS — I8510 Secondary esophageal varices without bleeding: Secondary | ICD-10-CM | POA: Diagnosis not present

## 2021-07-24 LAB — COMPREHENSIVE METABOLIC PANEL
ALT: 12 U/L (ref 0–44)
AST: 28 U/L (ref 15–41)
Albumin: 2.8 g/dL — ABNORMAL LOW (ref 3.5–5.0)
Alkaline Phosphatase: 54 U/L (ref 38–126)
Anion gap: 8 (ref 5–15)
BUN: 21 mg/dL (ref 8–23)
CO2: 24 mmol/L (ref 22–32)
Calcium: 8.5 mg/dL — ABNORMAL LOW (ref 8.9–10.3)
Chloride: 105 mmol/L (ref 98–111)
Creatinine, Ser: 1.2 mg/dL (ref 0.61–1.24)
GFR, Estimated: >60 mL/min (ref >60)
Glucose, Bld: 86 mg/dL (ref 70–99)
Potassium: 4.3 mmol/L (ref 3.5–5.1)
Sodium: 137 mmol/L (ref 135–145)
Total Bilirubin: 1 mg/dL (ref 0.3–1.2)
Total Protein: 5.9 g/dL — ABNORMAL LOW (ref 6.5–8.1)

## 2021-07-24 LAB — CBC
HCT: 27.2 % — ABNORMAL LOW (ref 39.0–52.0)
Hemoglobin: 7.4 g/dL — ABNORMAL LOW (ref 13.0–17.0)
MCH: 26.5 pg (ref 26.0–34.0)
MCHC: 27.2 g/dL — ABNORMAL LOW (ref 30.0–36.0)
MCV: 97.5 fL (ref 80.0–100.0)
Platelets: 67 10*3/uL — ABNORMAL LOW (ref 150–400)
RBC: 2.79 MIL/uL — ABNORMAL LOW (ref 4.22–5.81)
RDW: 18.3 % — ABNORMAL HIGH (ref 11.5–15.5)
WBC: 4.9 10*3/uL (ref 4.0–10.5)
nRBC: 0 % (ref 0.0–0.2)

## 2021-07-24 LAB — GLUCOSE, CAPILLARY
Glucose-Capillary: 46 mg/dL — ABNORMAL LOW (ref 70–99)
Glucose-Capillary: 89 mg/dL (ref 70–99)
Glucose-Capillary: 142 mg/dL — ABNORMAL HIGH (ref 70–99)
Glucose-Capillary: 209 mg/dL — ABNORMAL HIGH (ref 70–99)
Glucose-Capillary: 250 mg/dL — ABNORMAL HIGH (ref 70–99)

## 2021-07-24 LAB — PROTIME-INR
INR: 1.3 — ABNORMAL HIGH (ref 0.8–1.2)
Prothrombin Time: 16 seconds — ABNORMAL HIGH (ref 11.4–15.2)

## 2021-07-24 MED ORDER — PANTOPRAZOLE SODIUM 40 MG PO TBEC
40.0000 mg | DELAYED_RELEASE_TABLET | Freq: Two times a day (BID) | ORAL | Status: DC
  Administered 2021-07-24 – 2021-07-25 (×2): 40 mg via ORAL
  Filled 2021-07-24 (×2): qty 1

## 2021-07-24 MED ORDER — SODIUM CHLORIDE 0.9 % IV SOLN
250.0000 mg | Freq: Every day | INTRAVENOUS | Status: AC
  Administered 2021-07-24 – 2021-07-25 (×2): 250 mg via INTRAVENOUS
  Filled 2021-07-24 (×2): qty 20

## 2021-07-24 MED ORDER — DEXTROSE 50 % IV SOLN
25.0000 g | INTRAVENOUS | Status: AC
  Administered 2021-07-24: 25 g via INTRAVENOUS

## 2021-07-24 MED ORDER — DEXTROSE 50 % IV SOLN
INTRAVENOUS | Status: AC
  Filled 2021-07-24: qty 50

## 2021-07-24 NOTE — Plan of Care (Signed)

## 2021-07-24 NOTE — Progress Notes (Signed)
Subjective: No confusion or mental status changes. No overt GI bleeding. Tolerating clear liquids. No abdominal pain. No N/V. Wife at bedside.   Objective: Vital signs in last 24 hours: Temp:  [97.5 F (36.4 C)-97.9 F (36.6 C)] 97.5 F (36.4 C) (08/12 0806) Pulse Rate:  [57-79] 69 (08/12 0800) Resp:  [13-21] 16 (08/12 0800) BP: (84-141)/(48-98) 117/51 (08/12 0800) SpO2:  [92 %-100 %] 94 % (08/12 0800) Last BM Date: 07/21/21 General:   Alert and oriented, pleasant, pale appearing Head:  Normocephalic and atraumatic. Abdomen:  Bowel sounds present, soft, non-tender, non-distended. No HSM or hernias noted. No rebound or guarding. No masses appreciated  Extremities:  Without edema. Neurologic:  Alert and  oriented x4 Psych:  Alert and cooperative. Normal mood and affect.  Intake/Output from previous day: 08/11 0701 - 08/12 0700 In: 1376.4 [I.V.:1376.4] Out: 1450 [Urine:1450] Intake/Output this shift: Total I/O In: 138.9 [I.V.:138.9] Out: -   Lab Results: Recent Labs    07/22/21 0623 07/23/21 0426 07/24/21 0349  WBC 4.6 6.0 4.9  HGB 7.1* 7.5* 7.4*  HCT 25.2* 26.9* 27.2*  PLT 78* 81* 67*   BMET Recent Labs    07/22/21 0623 07/23/21 0426 07/24/21 0349  NA 135 133* 137  K 4.3 4.2 4.3  CL 102 102 105  CO2 27 24 24   GLUCOSE 149* 178* 86  BUN 22 23 21   CREATININE 1.24 1.28* 1.20  CALCIUM 8.1* 8.1* 8.5*   LFT Recent Labs    07/22/21 0623 07/23/21 0426 07/24/21 0349  PROT 5.8* 5.8* 5.9*  ALBUMIN 2.7* 2.7* 2.8*  AST 25 28 28   ALT 13 14 12   ALKPHOS 56 54 54  BILITOT 1.6* 1.0 1.0   PT/INR Recent Labs    07/22/21 0623 07/23/21 0426  LABPROT 16.3* 16.2*  INR 1.3* 1.3*     Assessment: 60 year old male with history of cirrhosis likely secondary to NASH, prior history of very remote ETOH abuse, known esophageal varices with multiple prior bandings, GAVE, followed by [facility] for consideration of transplant, presenting to the ED 8/9 with altered mental  status and acute on chronic anemia.   Acute on chronic anemia: chronic anemia in setting of CKD and IDA. Heme positive on admission but WITHOUT overt bleeding. Hgb 6.7 on admission, receiving 1 unit PRBCs this admission and 7.4 today. EGD yesterday with Grade 2 esophageal varices without stigmata of bleeding, s/p banding and eradicated. Portal gastropathy and GAVE without active bleeding noted but small amount of fresh blood noted in gastric fundus. Normal duodenum. Followed by Hematology and has received outpatient Venofer infusion this past Monday and next due on the 17th. Colonoscopy in 2020 with multiple polyps and portal colopathy. Inadequate prep at that time. Rectal varices noted. Repeat attempt in 2021 with inadequate prep. Remains without overt GI bleeding. Suspect chronic blood loss secondary to portal gastropathy/GAVE. Unable to rule out concomitant small bowel source. Less likely occult malignancy in colon although he has not had an adequate colonoscopy.   Will continue to follow H/H and anticipate discharge tomorrow. Needs close follow-up as outpatient with serial labs. I have reached out to Tarri Abernethy, PA-C with Hematology regarding iron infusions, as he received earlier this week. Will order ferric gluconate (on formulary) 250 mg IV daily X 2 (last dose tomorrow). Hematology will continue with iron infusions as outpatient due to ongoing deficit.      Plan: Change PPI infusion to BID, then continue BID as outpatient for 8 weeks then down to once  daily Carafate 1 g TID before meals and at bedtime Repeat EGD in 4 weeks Consider capsule study if concern for small bowel etiology contributing  Ferric gluconate 250 mg IV daily X 2 ordered, with last dose tomorrow Hematology will adjust additional iron dosing as outpatient Discontinue octreotide Stop PPI infusion Will arrange outpatient follow-up   Annitta Needs, PhD, ANP-BC [facility]      LOS: 3 days     07/24/2021, 9:20 AM

## 2021-07-24 NOTE — Evaluation (Signed)
Physical Therapy Evaluation Patient Details Name: [name] MRN: [id number] DOB: 1961/05/09 Today's Date: 07/24/2021   History of Present Illness  [name]  is a 60 y.o. male, with history of anemia, coronary artery disease, cirrhosis currently undergoing evaluation to be on transplant list, CKD, diastolic CHF, essential hypertension, hyperlipidemia, type 2 diabetes mellitus, and more presents the ED with a chief complaint of altered mental status.  Patient reports he is not sure why is in the ED and to to ask his wife at bedside.  Wife is very vague history.  She reports that patient has been confused but got worse at 10 AM today.  She says that he called her and he was not giving appropriate answers to questions.  For example she asked to have what he had for breakfast, he responded I love you to.  She reports that she was worried about him being dehydrated because he had some chest congestion and was started on a 10-day course of antibiotics-which she finished.  He also started on Mucinex, and he takes fluid pill.  She thought the combination of these medications may have dehydrated the patient.  She reports last night he was agitated and uncomfortable.  She reports he is never short tempered and he never uses profanity.  Last night he was very short tempered and also profane.  She reports has been complaining of shortness of breath and taking albuterol.  She does not report any visualized blood in the stool.  He has been following with [facility] for possible liver transplant.  He is also had 9 varices banded over the last 2 months.  He was supposed to get another EGD for follow-up in 5 days.  She reports at baseline he is sharp, good memory.  She reports that he has never had hepatic encephalopathy in the past.  She does report that he takes his lactulose.  She had no other history to give at this time.  Patient reports he has nothing to add at this time either.   Clinical Impression   Patient limited for functional mobility as stated below secondary to BLE weakness, fatigue and impaired standing balance. Patient requires minimal assist to pull to seated to sit EOB. He demonstrates good sitting tolerance and sitting balance EOB. He transfers to standing with min assist and RW and is slightly unsteady upon initial standing. Patient ambulates several feet to chair at bedside with labored cadence with use of RW for support. Patient ends session seated in chair - RN aware. Patient will benefit from continued physical therapy in hospital and recommended venue below to increase strength, balance, endurance for safe ADLs and gait.     Follow Up Recommendations Home health PT    Equipment Recommendations  None recommended by PT    Recommendations for Other Services       Precautions / Restrictions Precautions Precautions: Fall Restrictions Weight Bearing Restrictions: No      Mobility  Bed Mobility Overal bed mobility: Needs Assistance Bed Mobility: Supine to Sit     Supine to sit: Min guard;Min assist     General bed mobility comments: minimal assist to pull to sitting    Transfers Overall transfer level: Needs assistance Equipment used: Rolling walker (2 wheeled) Transfers: Sit to/from Stand Sit to Stand: Min guard;Min assist         General transfer comment: transfer to standing with RW, requires use of bed rail and RW  Ambulation/Gait Ambulation/Gait assistance: Min guard Gait Distance (Feet): 4  Feet Assistive device: Rolling walker (2 wheeled) Gait Pattern/deviations: Shuffle Gait velocity: decreased   General Gait Details: slow, labored cadence with RW to chair at bedside  Stairs            Wheelchair Mobility    Modified Rankin (Stroke Patients Only)       Balance Overall balance assessment: Needs assistance Sitting-balance support: Feet supported;No upper extremity supported Sitting balance-Leahy Scale: Good Sitting balance -  Comments: seated EOB   Standing balance support: Bilateral upper extremity supported Standing balance-Leahy Scale: Fair Standing balance comment: with RW                             Pertinent Vitals/Pain Pain Assessment: No/denies pain    Home Living Family/patient expects to be discharged to:: Private residence Living Arrangements: Spouse/significant other Available Help at Discharge: Family;Available 24 hours/day Type of Home: House Home Access: Stairs to enter Entrance Stairs-Rails: Left Entrance Stairs-Number of Steps: 3 Home Layout: One level Home Equipment: Walker - 2 wheels;Cane - single point;Cane - quad      Prior Function Level of Independence: Independent with assistive device(s)         Comments: Hydrographic surveyor using RW PRN     Hand Dominance        Extremity/Trunk Assessment   Upper Extremity Assessment Upper Extremity Assessment: Overall WFL for tasks assessed    Lower Extremity Assessment Lower Extremity Assessment: Generalized weakness    Cervical / Trunk Assessment Cervical / Trunk Assessment: Normal  Communication   Communication: No difficulties  Cognition Arousal/Alertness: Awake/alert Behavior During Therapy: WFL for tasks assessed/performed Overall Cognitive Status: Within Functional Limits for tasks assessed                                        General Comments      Exercises     Assessment/Plan    PT Assessment Patient needs continued PT services  PT Problem List Decreased strength;Decreased mobility;Decreased activity tolerance;Decreased balance       PT Treatment Interventions DME instruction;Therapeutic exercise;Gait training;Balance training;Stair training;Neuromuscular re-education;Functional mobility training;Patient/family education;Therapeutic activities    PT Goals (Current goals can be found in the Care Plan section)  Acute Rehab PT Goals Patient Stated Goal: Return home PT  Goal Formulation: With patient Time For Goal Achievement: 08/07/21 Potential to Achieve Goals: Good    Frequency Min 3X/week   Barriers to discharge        Co-evaluation               AM-PAC PT "6 Clicks" Mobility  Outcome Measure Help needed turning from your back to your side while in a flat bed without using bedrails?: None Help needed moving from lying on your back to sitting on the side of a flat bed without using bedrails?: A Little Help needed moving to and from a bed to a chair (including a wheelchair)?: A Little Help needed standing up from a chair using your arms (e.g., wheelchair or bedside chair)?: A Little Help needed to walk in hospital room?: A Little Help needed climbing 3-5 steps with a railing? : A Lot 6 Click Score: 18    End of Session Equipment Utilized During Treatment: Gait belt Activity Tolerance: Patient tolerated treatment well Patient left: in chair;with call bell/phone within reach Nurse Communication: Mobility status PT Visit Diagnosis: Unsteadiness on  feet (R26.81);Other abnormalities of gait and mobility (R26.89);Muscle weakness (generalized) (M62.81)    Time: 0962-8366 PT Time Calculation (min) (ACUTE ONLY): 29 min   Charges:   PT Evaluation $PT Eval Low Complexity: 1 Low PT Treatments $Therapeutic Activity: 23-37 mins        2:59 PM, 07/24/21 Mearl Latin PT, DPT Physical Therapist at [facility]

## 2021-07-24 NOTE — Plan of Care (Signed)
  Problem: Acute Rehab PT Goals(only PT should resolve) Goal: Pt Will Go Supine/Side To Sit Outcome: Progressing Flowsheets (Taken 07/24/2021 1501) Pt will go Supine/Side to Sit: with supervision Goal: Patient Will Transfer Sit To/From Stand Outcome: Progressing Flowsheets (Taken 07/24/2021 1501) Patient will transfer sit to/from stand: with supervision Goal: Pt Will Transfer Bed To Chair/Chair To Bed Outcome: Progressing Flowsheets (Taken 07/24/2021 1501) Pt will Transfer Bed to Chair/Chair to Bed: with supervision Goal: Pt Will Ambulate Outcome: Progressing Flowsheets (Taken 07/24/2021 1501) Pt will Ambulate:  50 feet  with modified independence  with least restrictive assistive device Goal: Pt/caregiver will Perform Home Exercise Program Outcome: Progressing Flowsheets (Taken 07/24/2021 1501) Pt/caregiver will Perform Home Exercise Program:  For increased strengthening  For improved balance  Independently  3:02 PM, 07/24/21 Mearl Latin PT, DPT Physical Therapist at [facility]

## 2021-07-24 NOTE — Progress Notes (Signed)
PROGRESS NOTE    STEPEHN ECKARD  FVC:[id number] DOB: 1961-11-24 DOA: 07/21/2021 PCP: [name], MD    Brief Narrative:  60 y.o. male, with history of anemia, coronary artery disease, cirrhosis currently undergoing evaluation to be on transplant list, CKD, diastolic CHF, essential hypertension, hyperlipidemia, type 2 diabetes mellitus, and more presents the ED with a chief complaint of altered mental status. Of note, pt recently had multiple esophageal varices banded. Presented with hgb of 6.7 with ammonia of 68. Pt was admitted for concerns of acute blood loss anemia  Assessment & Plan:   Active Problems:   Diabetes mellitus type 2, uncontrolled (HCC)   Acute on chronic heart failure with preserved ejection fraction (HCC)   Acute renal failure superimposed on stage 3b chronic kidney disease (HCC)   Anemia   GI bleed   Acute metabolic encephalopathy    GI bleeding and symptomatic anemia With history of banded varices GI consulted and pt underwent EGD 8/11 with findings of grade 2 esophageal varices with no stigmata of recent bleed, banded GI recs for BID PO PPI x 8 weeks and carafate 1g before every meal and nightly Repeat EGD in 4 weeks for retreatment Octreotide stopped today per GI Acute metabolic encephalopathy Multifactorial Suspect possible UTI, hepatic encephalopahty Treat as below Cont abx and lactulose CT head shows no acute changes Mentation normalized Elevated ammonia In setting of cirrhosis Continue lactulose Ammonia improved CKD stage III Renal function seems to be at baseline Continue to monitor Diabetes mellitus type 2 Monitor CBG every 6 hours Sliding scale coverage Glycemic trends stable Possible UTI UA is borderline suspicious for UTI Urine culture pending Continue Rocephin that has already been started for GI bleed will cover UTI Stable Adrenal insufficiency Solu-Cortef given in the ED Was continued on Solu-Cortef BP stable at this  time Hypokalemia Corrected Repeat bmet in AM  Hypertension BP meds remain on hold secondary to soft pressures seen at time of presentation BP now stable Hold further midodrine Would resume bp meds as able   DVT prophylaxis: SCD's Code Status: Full Family Communication: Pt in room, family not at bedside  Status is: Inpatient  Remains inpatient appropriate because:Inpatient level of care appropriate due to severity of illness  Dispo: The patient is from: Home              Anticipated d/c is to: Home              Patient currently is not medically stable to d/c.   Difficult to place patient No   Consultants:  GI  Procedures:    Antimicrobials: Anti-infectives (From admission, onward)    Start     Dose/Rate Route Frequency Ordered Stop   07/22/21 2100  cefTRIAXone (ROCEPHIN) 2 g in sodium chloride 0.9 % 100 mL IVPB        2 g 200 mL/hr over 30 Minutes Intravenous Every 24 hours 07/21/21 2159     07/22/21 2030  cefTRIAXone (ROCEPHIN) 1 g in sodium chloride 0.9 % 100 mL IVPB  Status:  Discontinued        1 g 200 mL/hr over 30 Minutes Intravenous Every 24 hours 07/21/21 2128 07/21/21 2159   07/21/21 2030  cefTRIAXone (ROCEPHIN) 1 g in sodium chloride 0.9 % 100 mL IVPB        1 g 200 mL/hr over 30 Minutes Intravenous  Once 07/21/21 2025 07/21/21 2158       Subjective: Without complaints  Objective: Vitals:   07/24/21 1400  07/24/21 1500 07/24/21 1600 07/24/21 1628  BP: 108/63 137/84 (!) 140/57   Pulse: 81 75 72   Resp: 16 15 16    Temp:    97.9 F (36.6 C)  TempSrc:    Oral  SpO2: 100% 99% 100%   Weight:      Height:        Intake/Output Summary (Last 24 hours) at 07/24/2021 1628 Last data filed at 07/24/2021 1612 Gross per 24 hour  Intake 1364.17 ml  Output 1400 ml  Net -35.83 ml    Filed Weights   07/21/21 1645 07/22/21 0032 07/23/21 0431  Weight: 120.2 kg 124.1 kg 125.4 kg    Examination: General exam: Awake, laying in bed, in nad Respiratory  system: Normal respiratory effort, no wheezing Cardiovascular system: regular rate, s1, s2 Gastrointestinal system: Soft, nondistended, positive BS Central nervous system: CN2-12 grossly intact, strength intact Extremities: Perfused, no clubbing Skin: Normal skin turgor, no notable skin lesions seen Psychiatry: Mood normal // no visual hallucinations   Data Reviewed: I have personally reviewed following labs and imaging studies  CBC: Recent Labs  Lab 07/21/21 1644 07/22/21 0623 07/23/21 0426 07/24/21 0349  WBC 6.9 4.6 6.0 4.9  NEUTROABS 5.0  --   --   --   HGB 6.7* 7.1* 7.5* 7.4*  HCT 23.8* 25.2* 26.9* 27.2*  MCV 91.9 92.6 95.4 97.5  PLT 95* 78* 81* 67*    Basic Metabolic Panel: Recent Labs  Lab 07/21/21 1644 07/22/21 0623 07/23/21 0426 07/24/21 0349  NA 132* 135 133* 137  K 3.4* 4.3 4.2 4.3  CL 98 102 102 105  CO2 26 27 24 24   GLUCOSE 297* 149* 178* 86  BUN 26* 22 23 21   CREATININE 1.29* 1.24 1.28* 1.20  CALCIUM 8.1* 8.1* 8.1* 8.5*  MG  --  2.2  --   --     GFR: Estimated Creatinine Clearance: 85.9 mL/min (by C-G formula based on SCr of 1.2 mg/dL). Liver Function Tests: Recent Labs  Lab 07/21/21 1644 07/22/21 0623 07/23/21 0426 07/24/21 0349  AST 26 25 28 28   ALT 14 13 14 12   ALKPHOS 70 56 54 54  BILITOT 1.3* 1.6* 1.0 1.0  PROT 6.4* 5.8* 5.8* 5.9*  ALBUMIN 3.0* 2.7* 2.7* 2.8*    No results for input(s): LIPASE, AMYLASE in the last 168 hours. Recent Labs  Lab 07/21/21 1644 07/23/21 0426  AMMONIA 68* 27    Coagulation Profile: Recent Labs  Lab 07/21/21 1644 07/22/21 0623 07/23/21 0426 07/24/21 1026  INR 1.2 1.3* 1.3* 1.3*    Cardiac Enzymes: No results for input(s): CKTOTAL, CKMB, CKMBINDEX, TROPONINI in the last 168 hours. BNP (last 3 results) No results for input(s): PROBNP in the last 8760 hours. HbA1C: Recent Labs    07/22/21 0623  HGBA1C 6.7*    CBG: Recent Labs  Lab 07/23/21 1648 07/23/21 2103 07/24/21 0746  07/24/21 0817 07/24/21 1138  GLUCAP 96 148* 46* 89 142*    Lipid Profile: No results for input(s): CHOL, HDL, LDLCALC, TRIG, CHOLHDL, LDLDIRECT in the last 72 hours. Thyroid Function Tests: No results for input(s): TSH, T4TOTAL, FREET4, T3FREE, THYROIDAB in the last 72 hours. Anemia Panel: No results for input(s): VITAMINB12, FOLATE, FERRITIN, TIBC, IRON, RETICCTPCT in the last 72 hours. Sepsis Labs: No results for input(s): PROCALCITON, LATICACIDVEN in the last 168 hours.  Recent Results (from the past 240 hour(s))  Resp Panel by RT-PCR (Flu A&B, Covid) Nasopharyngeal Swab     Status: None   Collection  Time: 07/21/21  9:21 PM   Specimen: Nasopharyngeal Swab; Nasopharyngeal(NP) swabs in vial transport medium  Result Value Ref Range Status   SARS Coronavirus 2 by RT PCR NEGATIVE NEGATIVE Final    Comment: (NOTE) SARS-CoV-2 target nucleic acids are NOT DETECTED.  The SARS-CoV-2 RNA is generally detectable in upper respiratory specimens during the acute phase of infection. The lowest concentration of SARS-CoV-2 viral copies this assay can detect is 138 copies/mL. A negative result does not preclude SARS-Cov-2 infection and should not be used as the sole basis for treatment or other patient management decisions. A negative result may occur with  improper specimen collection/handling, submission of specimen other than nasopharyngeal swab, presence of viral mutation(s) within the areas targeted by this assay, and inadequate number of viral copies(<138 copies/mL). A negative result must be combined with clinical observations, patient history, and epidemiological information. The expected result is Negative.  Fact Sheet for Patients:  EntrepreneurPulse.com.au  Fact Sheet for Healthcare Providers:  IncredibleEmployment.be  This test is no t yet approved or cleared by the [place] FDA and  has been authorized for detection and/or diagnosis of  SARS-CoV-2 by FDA under an Emergency Use Authorization (EUA). This EUA will remain  in effect (meaning this test can be used) for the duration of the COVID-19 declaration under Section 564(b)(1) of the Act, 21 U.S.C.section 360bbb-3(b)(1), unless the authorization is terminated  or revoked sooner.       Influenza A by PCR NEGATIVE NEGATIVE Final   Influenza B by PCR NEGATIVE NEGATIVE Final    Comment: (NOTE) The Xpert Xpress SARS-CoV-2/FLU/RSV plus assay is intended as an aid in the diagnosis of influenza from Nasopharyngeal swab specimens and should not be used as a sole basis for treatment. Nasal washings and aspirates are unacceptable for Xpert Xpress SARS-CoV-2/FLU/RSV testing.  Fact Sheet for Patients: EntrepreneurPulse.com.au  Fact Sheet for Healthcare Providers: IncredibleEmployment.be  This test is not yet approved or cleared by the [place] FDA and has been authorized for detection and/or diagnosis of SARS-CoV-2 by FDA under an Emergency Use Authorization (EUA). This EUA will remain in effect (meaning this test can be used) for the duration of the COVID-19 declaration under Section 564(b)(1) of the Act, 21 U.S.C. section 360bbb-3(b)(1), unless the authorization is terminated or revoked.  Performed at [facility], [address], [place], [place] [address]   MRSA Next Gen by PCR, Nasal     Status: None   Collection Time: 07/22/21 12:11 AM   Specimen: Nasal Mucosa; Nasal Swab  Result Value Ref Range Status   MRSA by PCR Next Gen NOT DETECTED NOT DETECTED Final    Comment: (NOTE) The GeneXpert MRSA Assay (FDA approved for NASAL specimens only), is one component of a comprehensive MRSA colonization surveillance program. It is not intended to diagnose MRSA infection nor to guide or monitor treatment for MRSA infections. Test performance is not FDA approved in patients less than 48 years old. Performed at [facility], [address] [place],  [address]       Radiology Studies: No results found.  Scheduled Meds:  Chlorhexidine Gluconate Cloth  6 each Topical Daily   dextrose       hydrocortisone sod succinate (SOLU-CORTEF) inj  100 mg Intravenous Q12H   insulin aspart  0-15 Units Subcutaneous TID WC   insulin aspart  0-5 Units Subcutaneous QHS   insulin detemir  30 Units Subcutaneous QHS   lactulose  20 g Oral Daily   loratadine  10  mg Oral q AM   midodrine  10 mg Oral TID AC   pantoprazole  40 mg Oral BID AC   pravastatin  20 mg Oral q1800   venlafaxine XR  37.5 mg Oral Daily   Continuous Infusions:  cefTRIAXone (ROCEPHIN)  IV Stopped (07/23/21 2145)   ferric gluconate (FERRLECIT) IVPB 250 mg (07/24/21 1612)     LOS: 3 days   Marylu Lund, MD [facility] Pager On Amion  If 7PM-7AM, please contact night-coverage 07/24/2021, 4:28 PM

## 2021-07-25 DIAGNOSIS — I5033 Acute on chronic diastolic (congestive) heart failure: Secondary | ICD-10-CM | POA: Diagnosis not present

## 2021-07-25 DIAGNOSIS — I8510 Secondary esophageal varices without bleeding: Secondary | ICD-10-CM | POA: Diagnosis not present

## 2021-07-25 DIAGNOSIS — K922 Gastrointestinal hemorrhage, unspecified: Secondary | ICD-10-CM | POA: Diagnosis not present

## 2021-07-25 DIAGNOSIS — K7460 Unspecified cirrhosis of liver: Secondary | ICD-10-CM | POA: Diagnosis not present

## 2021-07-25 LAB — URINE CULTURE: Culture: NO GROWTH

## 2021-07-25 LAB — GLUCOSE, CAPILLARY
Glucose-Capillary: 169 mg/dL — ABNORMAL HIGH (ref 70–99)
Glucose-Capillary: 171 mg/dL — ABNORMAL HIGH (ref 70–99)
Glucose-Capillary: 224 mg/dL — ABNORMAL HIGH (ref 70–99)

## 2021-07-25 LAB — CBC
HCT: 29.3 % — ABNORMAL LOW (ref 39.0–52.0)
Hemoglobin: 7.9 g/dL — ABNORMAL LOW (ref 13.0–17.0)
MCH: 26.4 pg (ref 26.0–34.0)
MCHC: 27 g/dL — ABNORMAL LOW (ref 30.0–36.0)
MCV: 98 fL (ref 80.0–100.0)
Platelets: 54 10*3/uL — ABNORMAL LOW (ref 150–400)
RBC: 2.99 MIL/uL — ABNORMAL LOW (ref 4.22–5.81)
RDW: 19 % — ABNORMAL HIGH (ref 11.5–15.5)
WBC: 5.3 10*3/uL (ref 4.0–10.5)
nRBC: 0 % (ref 0.0–0.2)

## 2021-07-25 LAB — COMPREHENSIVE METABOLIC PANEL
ALT: 12 U/L (ref 0–44)
AST: 21 U/L (ref 15–41)
Albumin: 2.8 g/dL — ABNORMAL LOW (ref 3.5–5.0)
Alkaline Phosphatase: 54 U/L (ref 38–126)
Anion gap: 4 — ABNORMAL LOW (ref 5–15)
BUN: 20 mg/dL (ref 8–23)
CO2: 26 mmol/L (ref 22–32)
Calcium: 8.3 mg/dL — ABNORMAL LOW (ref 8.9–10.3)
Chloride: 105 mmol/L (ref 98–111)
Creatinine, Ser: 1.13 mg/dL (ref 0.61–1.24)
GFR, Estimated: >60 mL/min (ref >60)
Glucose, Bld: 217 mg/dL — ABNORMAL HIGH (ref 70–99)
Potassium: 4.2 mmol/L (ref 3.5–5.1)
Sodium: 135 mmol/L (ref 135–145)
Total Bilirubin: 0.9 mg/dL (ref 0.3–1.2)
Total Protein: 6.2 g/dL — ABNORMAL LOW (ref 6.5–8.1)

## 2021-07-25 MED ORDER — PANTOPRAZOLE SODIUM 40 MG PO TBEC: 40.0000 mg | DELAYED_RELEASE_TABLET | Freq: Two times a day (BID) | ORAL | 0 refills | Status: DC

## 2021-07-25 MED ORDER — SUCRALFATE 1 G PO TABS: 1.0000 g | ORAL_TABLET | Freq: Four times a day (QID) | ORAL | 0 refills | Status: DC

## 2021-07-25 NOTE — Progress Notes (Signed)
Subjective: Patient doing well.  No epigastric pain.  No nausea or vomiting.  No evidence of further bleeding.  Hemoglobin stable at 7.9.  Objective: Vital signs in last 24 hours: Temp:  [97.5 F (36.4 C)-98.4 F (36.9 C)] 98.4 F (36.9 C) (08/13 0736) Pulse Rate:  [59-81] 76 (08/13 0736) Resp:  [14-26] 16 (08/13 0736) BP: (108-155)/(57-85) 125/76 (08/13 0736) SpO2:  [94 %-100 %] 99 % (08/13 0736) Last BM Date: 07/21/21 General:   Alert and oriented, pleasant Head:  Normocephalic and atraumatic. Eyes:  No icterus, sclera clear. Conjuctiva pink.  Mouth:  Without lesions, mucosa pink and moist.  Abdomen:   soft, non-tender, non-distended. No HSM or hernias noted. No rebound or guarding. No masses appreciated  Msk:  Symmetrical without gross deformities. Normal posture. Extremities:  Without clubbing or edema. Neurologic:  Alert and  oriented x4;  grossly normal neurologically. Skin:  Warm and dry, intact without significant lesions.  Cervical Nodes:  No significant cervical adenopathy. Psych:  Alert and cooperative. Normal mood and affect.  Intake/Output from previous day: 08/12 0701 - 08/13 0700 In: 962 [I.V.:387.8; IV Piggyback:445.2] Out: 825 [Urine:825] Intake/Output this shift: Total I/O In: 360 [P.O.:360] Out: 150 [Urine:150]  Lab Results: Recent Labs    07/23/21 0426 07/24/21 0349 07/25/21 0536  WBC 6.0 4.9 5.3  HGB 7.5* 7.4* 7.9*  HCT 26.9* 27.2* 29.3*  PLT 81* 67* 54*   BMET Recent Labs    07/23/21 0426 07/24/21 0349 07/25/21 0536  NA 133* 137 135  K 4.2 4.3 4.2  CL 102 105 105  CO2 24 24 26   GLUCOSE 178* 86 217*  BUN 23 21 20   CREATININE 1.28* 1.20 1.13  CALCIUM 8.1* 8.5* 8.3*   LFT Recent Labs    07/23/21 0426 07/24/21 0349 07/25/21 0536  PROT 5.8* 5.9* 6.2*  ALBUMIN 2.7* 2.8* 2.8*  AST 28 28 21   ALT 14 12 12   ALKPHOS 54 54 54  BILITOT 1.0 1.0 0.9   PT/INR Recent Labs    07/23/21 0426 07/24/21 1026  LABPROT 16.2* 16.0*  INR 1.3*  1.3*   Hepatitis Panel No results for input(s): HEPBSAG, HCVAB, HEPAIGM, HEPBIGM in the last 72 hours.   Studies/Results: No results found.  Assessment: *Acute GI bleeding *Decompensated NASH cirrhosis complicated by portal hypertension *Esophageal varices status post EVL x2 07/23/2021  Plan: Patient doing well today.  Continue on twice daily p.o. PPI for 8 weeks.  Carafate 1 g 4 times daily x2 weeks.  Receiving IV iron today and yesterday.  Follow-up with hematology for further iron dosing as needed.  Bleeding likely from slow ooze from portal hypertensive gastropathy.  No stigmata or active bleeding noted on varices though I did elect to place 2 bands with complete eradication.  He will need repeat EGD in 4 weeks.  This can be done at [facility] where he has been evaluated for liver transplant.  We will arrange outpatient follow-up.  GI to sign off, please call with any questions concerns.  Elon Alas. Abbey Chatters, D.O. Gastroenterology and Hepatology [facility]   LOS: 4 days    07/25/2021, 11:37 AM

## 2021-07-25 NOTE — Discharge Summary (Addendum)
Physician Discharge Summary  [name] FTD:[id number] DOB: [birth year] DOA: 07/21/2021  PCP: [name], MD  Admit date: 07/21/2021 Discharge date: 07/25/2021  Admitted From: Home Disposition:  Home  Recommendations for Outpatient Follow-up:  Follow up with PCP in 1-2 weeks  Home Health:HHPT   Discharge Condition:Improved, stable CODE STATUS:Full Diet recommendation: Diabetic, heart healthy   Brief/Interim Summary: 60 y.o. male, with history of anemia, coronary artery disease, cirrhosis currently undergoing evaluation to be on transplant list, CKD, diastolic CHF, essential hypertension, hyperlipidemia, type 2 diabetes mellitus, and more presents the ED with a chief complaint of altered mental status. Of note, pt recently had multiple esophageal varices banded. Presented with hgb of 6.7 with ammonia of 68. Pt was admitted for concerns of acute blood loss anemia  Discharge Diagnoses:  Active Problems:   Diabetes mellitus type 2, uncontrolled (HCC)   Acute on chronic heart failure with preserved ejection fraction (HCC)   Acute renal failure superimposed on stage 3b chronic kidney disease (HCC)   Anemia   GI bleed   Acute metabolic encephalopathy  GI bleeding and symptomatic anemia With history of banded varices GI consulted and pt underwent EGD 8/11 with findings of grade 2 esophageal varices with no stigmata of recent bleed, banded GI recs for BID PO PPI x 8 weeks and carafate 1g before every meal and nightly Repeat EGD in 4 weeks for retreatment Octreotide stopped today per GI Acute metabolic encephalopathy Multifactorial Suspect possible UTI, hepatic encephalopahty Continue lactulose CT head shows no acute changes Resolved Elevated ammonia In setting of cirrhosis Would continue lactulose Ammonia improved CKD stage III Renal function seems to be at baseline Continue to monitor Diabetes mellitus type 2 Monitor CBG every 6 hours Sliding scale coverage Glycemic  trends stable Possible UTI UA is borderline suspicious for UTI Urine culture pending Continue Rocephin that has already been started for GI bleed will cover UTI Stable Adrenal insufficiency Solu-Cortef given in the ED Was continued on Solu-Cortef BP has improved Would have pt continue midodrine per home regimen Hypokalemia Corrected Repeat bmet in AM  Hypertension BP meds remain on hold secondary to soft pressures seen at time of presentation BP now stable with IV hydrocortisone Would have pt continue midodrine per home regimen As bp is trending up and given hx of heart disease s/p remote CABG, have resumed beta blocker on d/c CAD s/p CABG in 2016  Per above, hypotension resulted in holding bp meds initially Stress steroids now off and pt to continue home midodrine Given hx of CAD with CABG, have resumed beta blocker on d/c Discussed with GI, OK to resume ASA on 8/14    Discharge Instructions   Allergies as of 07/25/2021       Reactions   Fluticasone Rash        Medication List     STOP taking these medications    Armodafinil 200 MG Tabs   budesonide 180 MCG/ACT inhaler Commonly known as: PULMICORT   hydrochlorothiazide 25 MG tablet Commonly known as: HYDRODIURIL   lidocaine 5 % Commonly known as: LIDODERM   midodrine 10 MG tablet Commonly known as: PROAMATINE   omeprazole 40 MG capsule Commonly known as: PRILOSEC   potassium chloride SA 20 MEQ tablet Commonly known as: KLOR-CON   torsemide 20 MG tablet Commonly known as: DEMADEX       TAKE these medications    acetaminophen 325 MG tablet Commonly known as: TYLENOL Take 650 mg by mouth every 6 (six) hours as needed  for mild pain.   albuterol 108 (90 Base) MCG/ACT inhaler Commonly known as: VENTOLIN HFA Inhale 2 puffs into the lungs every 6 (six) hours as needed for wheezing or shortness of breath.   ALPRAZolam 0.5 MG tablet Commonly known as: XANAX Take 1 tablet (0.5 mg total) by mouth  daily as needed.   aspirin EC 81 MG tablet Take 81 mg by mouth at bedtime.   Dialyvite Vitamin D 5000 125 MCG (5000 UT) capsule Generic drug: Cholecalciferol Take 5,000 Units by mouth in the morning.   Elderberry Zinc/Vit C/Immune Lozg Take 1 lozenge by mouth at bedtime.   ferrous sulfate 325 (65 FE) MG tablet Take 325 mg by mouth in the morning.   Flaxseed Oil 1000 MG Caps Take 1,000 mg by mouth at bedtime.   FreeStyle Libre 14 Day Reader [name] 1 reader   [employer] 14 Day Sensor Misc 1 sensor   INS SYRINGE/NEEDLE .5CC/28G 28G X 1/2" 0.5 ML Misc Inject 1 Syringe into the skin See admin instructions.   insulin aspart 100 UNIT/ML injection Commonly known as: novoLOG Inject 5 Units into the skin 3 (three) times daily before meals. Special Instructions: If accu-check is greater than 150. Hold for accu-check 150 or below. With Meals   lactulose 10 GM/15ML solution Commonly known as: CHRONULAC Take 30 mLs (20 g total) by mouth daily.   lidocaine 2 % solution Commonly known as: XYLOCAINE 5 mLs as needed.   loratadine 10 MG tablet Commonly known as: CLARITIN Take 10 mg by mouth in the morning.   Magnesium 250 MG Tabs Take 250 mg by mouth daily.   Medical Compression Stockings Misc 1 each by Does not apply route as directed. Low pressure knee high compression stockings Dx: leg swelling   nadolol 20 MG tablet Commonly known as: CORGARD Take by mouth daily.   Ozempic (1 MG/DOSE) 4 MG/3ML Sopn Generic drug: Semaglutide (1 MG/DOSE) Inject 1 mg into the skin once a week.   pantoprazole 40 MG tablet Commonly known as: PROTONIX Take 1 tablet (40 mg total) by mouth 2 (two) times daily before a meal.   pravastatin 20 MG tablet Commonly known as: PRAVACHOL TAKE 1 TABLET(20 MG) BY MOUTH AT BEDTIME   sucralfate 1 g tablet Commonly known as: Carafate Take 1 tablet (1 g total) by mouth 4 (four) times daily for 14 days.   Tyler Aas FlexTouch 200 UNIT/ML FlexTouch  Pen Generic drug: insulin degludec Inject 40 Units into the skin in the morning. Give 30 units Subcutaneous  at Bedtime 8:00 pm   TURMERIC CURCUMIN PO Take 2,000 mg by mouth at bedtime.   venlafaxine XR 37.5 MG 24 hr capsule Commonly known as: EFFEXOR-XR Take 37.5 mg by mouth daily.        Follow-up Information     [name], MD Follow up in 2 week(s).   Specialty: Family Medicine Why: Hospital follow up Contact information: [address] [place] [address] [phone]         Satira Sark, MD .   Specialty: Cardiology Contact information: [place] [address] [phone]                Allergies  Allergen Reactions   Fluticasone Rash    Consultations: GI  Procedures/Studies: CT HEAD WO CONTRAST (5MM)  Result Date: 07/21/2021 CLINICAL DATA:  Slurred speech. Concern for cerebrovascular accident EXAM: CT HEAD WITHOUT CONTRAST TECHNIQUE: Contiguous axial images were obtained from the base of the skull through  the vertex without intravenous contrast. COMPARISON:  07/08/2016 FINDINGS: Brain: No acute intracranial hemorrhage. No focal mass lesion. No CT evidence of acute infarction. No midline shift or mass effect. No hydrocephalus. Basilar cisterns are patent. Vascular: No hyperdense vessel or unexpected calcification. Skull: Normal. Negative for fracture or focal lesion. Sinuses/Orbits: Mucosal thickening in the RIGHT maxillary sinus. Prior ethmoidectomy. Other: None IMPRESSION: 1. No acute intracranial findings. 2. No CT evidence of cortical infarction. 3. Chronic mucosal thickening in the RIGHT maxillary sinus. Electronically Signed   By: Suzy Bouchard M.D.   On: 07/21/2021 17:49    Subjective: Eager to go home  Discharge Exam: Vitals:   07/25/21 0736 07/25/21 1219  BP: 125/76 (!) 141/75  Pulse: 76 66  Resp: 16 16  Temp: 98.4 F (36.9 C) 97.7 F (36.5 C)  SpO2: 99% 99%   Vitals:   07/25/21 0140 07/25/21 0447  07/25/21 0736 07/25/21 1219  BP: 135/66 (!) 155/82 125/76 (!) 141/75  Pulse: 71 74 76 66  Resp: 14 18 16 16   Temp:  (!) 97.5 F (36.4 C) 98.4 F (36.9 C) 97.7 F (36.5 C)  TempSrc:  Oral  Oral  SpO2: 100% 100% 99% 99%  Weight:      Height:        General: Pt is alert, awake, not in acute distress Cardiovascular: RRR, S1/S2 + Respiratory: CTA bilaterally, no wheezing, no rhonchi Abdominal: Soft, NT, ND, bowel sounds + Extremities: no edema, no cyanosis   The results of significant diagnostics from this hospitalization (including imaging, microbiology, ancillary and laboratory) are listed below for reference.     Microbiology: Recent Results (from the past 240 hour(s))  Resp Panel by RT-PCR (Flu A&B, Covid) Nasopharyngeal Swab     Status: None   Collection Time: 07/21/21  9:21 PM   Specimen: Nasopharyngeal Swab; Nasopharyngeal(NP) swabs in vial transport medium  Result Value Ref Range Status   SARS Coronavirus 2 by RT PCR NEGATIVE NEGATIVE Final    Comment: (NOTE) SARS-CoV-2 target nucleic acids are NOT DETECTED.  The SARS-CoV-2 RNA is generally detectable in upper respiratory specimens during the acute phase of infection. The lowest concentration of SARS-CoV-2 viral copies this assay can detect is 138 copies/mL. A negative result does not preclude SARS-Cov-2 infection and should not be used as the sole basis for treatment or other patient management decisions. A negative result may occur with  improper specimen collection/handling, submission of specimen other than nasopharyngeal swab, presence of viral mutation(s) within the areas targeted by this assay, and inadequate number of viral copies(<138 copies/mL). A negative result must be combined with clinical observations, patient history, and epidemiological information. The expected result is Negative.  Fact Sheet for Patients:  EntrepreneurPulse.com.au  Fact Sheet for Healthcare Providers:   IncredibleEmployment.be  This test is no t yet approved or cleared by the [place] FDA and  has been authorized for detection and/or diagnosis of SARS-CoV-2 by FDA under an Emergency Use Authorization (EUA). This EUA will remain  in effect (meaning this test can be used) for the duration of the COVID-19 declaration under Section 564(b)(1) of the Act, 21 U.S.C.section 360bbb-3(b)(1), unless the authorization is terminated  or revoked sooner.       Influenza A by PCR NEGATIVE NEGATIVE Final   Influenza B by PCR NEGATIVE NEGATIVE Final    Comment: (NOTE) The Xpert Xpress SARS-CoV-2/FLU/RSV plus assay is intended as an aid in the diagnosis of influenza from Nasopharyngeal swab specimens and should not be used as  a sole basis for treatment. Nasal washings and aspirates are unacceptable for Xpert Xpress SARS-CoV-2/FLU/RSV testing.  Fact Sheet for Patients: EntrepreneurPulse.com.au  Fact Sheet for Healthcare Providers: IncredibleEmployment.be  This test is not yet approved or cleared by the [place] FDA and has been authorized for detection and/or diagnosis of SARS-CoV-2 by FDA under an Emergency Use Authorization (EUA). This EUA will remain in effect (meaning this test can be used) for the duration of the COVID-19 declaration under Section 564(b)(1) of the Act, 21 U.S.C. section 360bbb-3(b)(1), unless the authorization is terminated or revoked.  Performed at [facility], [address], [place], [place] [address]   MRSA Next Gen by PCR, Nasal     Status: None   Collection Time: 07/22/21 12:11 AM   Specimen: Nasal Mucosa; Nasal Swab  Result Value Ref Range Status   MRSA by PCR Next Gen NOT DETECTED NOT DETECTED Final    Comment: (NOTE) The GeneXpert MRSA Assay (FDA approved for NASAL specimens only), is one component of a comprehensive MRSA colonization surveillance program. It is not intended to diagnose MRSA  infection nor to guide or monitor treatment for MRSA infections. Test performance is not FDA approved in patients less than 30 years old. Performed at [facility], [address], [place], [place] [address]   Urine Culture     Status: None   Collection Time: 07/23/21  6:35 PM   Specimen: Urine, Clean Catch  Result Value Ref Range Status   Specimen Description   Final    URINE, CLEAN CATCH Performed at [facility], [address], [place], [place] [address]    Special Requests   Final    NONE Performed at [facility], [address] [place], [place] [address]    Culture   Final    NO GROWTH Performed at [facility], [place] [address], [place], [place] [address]    Report Status 07/25/2021 FINAL  Final     Labs: BNP (last 3 results) No results for input(s): BNP in the last 8760 hours. Basic Metabolic Panel: Recent Labs  Lab 07/21/21 1644 07/22/21 0623 07/23/21 0426 07/24/21 0349 07/25/21 0536  NA 132* 135 133* 137 135  K 3.4* 4.3 4.2 4.3 4.2  CL 98 102 102 105 105  CO2 26 27 24 24 26   GLUCOSE 297* 149* 178* 86 217*  BUN 26* 22 23 21 20   CREATININE 1.29* 1.24 1.28* 1.20 1.13  CALCIUM 8.1* 8.1* 8.1* 8.5* 8.3*  MG  --  2.2  --   --   --    Liver Function Tests: Recent Labs  Lab 07/21/21 1644 07/22/21 0623 07/23/21 0426 07/24/21 0349 07/25/21 0536  AST 26 25 28 28 21   ALT 14 13 14 12 12   ALKPHOS 70 56 54 54 54  BILITOT 1.3* 1.6* 1.0 1.0 0.9  PROT 6.4* 5.8* 5.8* 5.9* 6.2*  ALBUMIN 3.0* 2.7* 2.7* 2.8* 2.8*   No results for input(s): LIPASE, AMYLASE in the last 168 hours. Recent Labs  Lab 07/21/21 1644 07/23/21 0426  AMMONIA 68* 27   CBC: Recent Labs  Lab 07/21/21 1644 07/22/21 0623 07/23/21 0426 07/24/21 0349 07/25/21 0536  WBC 6.9 4.6 6.0 4.9 5.3  NEUTROABS 5.0  --   --   --   --   HGB 6.7* 7.1* 7.5* 7.4* 7.9*  HCT 23.8* 25.2* 26.9* 27.2* 29.3*  MCV 91.9 92.6 95.4 97.5 98.0  PLT 95* 78* 81* 67* 54*   Cardiac Enzymes: No results for  input(s): CKTOTAL, CKMB, CKMBINDEX, TROPONINI in the last 168 hours. BNP: Invalid input(s): POCBNP CBG: Recent Labs  Lab 07/24/21 1138 07/24/21 1643 07/24/21 2107 07/25/21 0745 07/25/21 1132  GLUCAP 142* 209* 250* 169* 171*   D-Dimer No results for input(s): DDIMER in the last 72 hours. Hgb A1c No results for input(s): HGBA1C in the last 72 hours. Lipid Profile No results for input(s): CHOL, HDL, LDLCALC, TRIG, CHOLHDL, LDLDIRECT in the last 72 hours. Thyroid function studies No results for input(s): TSH, T4TOTAL, T3FREE, THYROIDAB in the last 72 hours.  Invalid input(s): FREET3 Anemia work up No results for input(s): VITAMINB12, FOLATE, FERRITIN, TIBC, IRON, RETICCTPCT in the last 72 hours. Urinalysis    Component Value Date/Time   COLORURINE YELLOW 07/21/2021 1705   APPEARANCEUR HAZY (A) 07/21/2021 1705   LABSPEC 1.018 07/21/2021 1705   PHURINE 6.0 07/21/2021 1705   GLUCOSEU 150 (A) 07/21/2021 1705   HGBUR MODERATE (A) 07/21/2021 1705   BILIRUBINUR NEGATIVE 07/21/2021 1705   KETONESUR 5 (A) 07/21/2021 1705   PROTEINUR NEGATIVE 07/21/2021 1705   UROBILINOGEN 1.0 08/28/2015 1634   NITRITE NEGATIVE 07/21/2021 1705   LEUKOCYTESUR LARGE (A) 07/21/2021 1705   Sepsis Labs Invalid input(s): PROCALCITONIN,  WBC,  LACTICIDVEN Microbiology Recent Results (from the past 240 hour(s))  Resp Panel by RT-PCR (Flu A&B, Covid) Nasopharyngeal Swab     Status: None   Collection Time: 07/21/21  9:21 PM   Specimen: Nasopharyngeal Swab; Nasopharyngeal(NP) swabs in vial transport medium  Result Value Ref Range Status   SARS Coronavirus 2 by RT PCR NEGATIVE NEGATIVE Final    Comment: (NOTE) SARS-CoV-2 target nucleic acids are NOT DETECTED.  The SARS-CoV-2 RNA is generally detectable in upper respiratory specimens during the acute phase of infection. The lowest concentration of SARS-CoV-2 viral copies this assay can detect is 138 copies/mL. A negative result does not preclude  SARS-Cov-2 infection and should not be used as the sole basis for treatment or other patient management decisions. A negative result may occur with  improper specimen collection/handling, submission of specimen other than nasopharyngeal swab, presence of viral mutation(s) within the areas targeted by this assay, and inadequate number of viral copies(<138 copies/mL). A negative result must be combined with clinical observations, patient history, and epidemiological information. The expected result is Negative.  Fact Sheet for Patients:  EntrepreneurPulse.com.au  Fact Sheet for Healthcare Providers:  IncredibleEmployment.be  This test is no t yet approved or cleared by the [place] FDA and  has been authorized for detection and/or diagnosis of SARS-CoV-2 by FDA under an Emergency Use Authorization (EUA). This EUA will remain  in effect (meaning this test can be used) for the duration of the COVID-19 declaration under Section 564(b)(1) of the Act, 21 U.S.C.section 360bbb-3(b)(1), unless the authorization is terminated  or revoked sooner.       Influenza A by PCR NEGATIVE NEGATIVE Final   Influenza B by PCR NEGATIVE NEGATIVE Final    Comment: (NOTE) The Xpert Xpress SARS-CoV-2/FLU/RSV plus assay is intended as an aid in the diagnosis of influenza from Nasopharyngeal swab specimens and should not be used as a sole basis for treatment. Nasal washings and aspirates are unacceptable for Xpert Xpress SARS-CoV-2/FLU/RSV testing.  Fact Sheet for Patients: EntrepreneurPulse.com.au  Fact Sheet for Healthcare Providers: IncredibleEmployment.be  This test is not yet approved or cleared by the [place] FDA and has been authorized for detection and/or diagnosis of SARS-CoV-2 by FDA under an Emergency Use Authorization (EUA). This EUA will remain in effect (  meaning this test can be used) for the duration of  the COVID-19 declaration under Section 564(b)(1) of the Act, 21 U.S.C. section 360bbb-3(b)(1), unless the authorization is terminated or revoked.  Performed at [facility], [address], [place], [place] [address]   MRSA Next Gen by PCR, Nasal     Status: None   Collection Time: 07/22/21 12:11 AM   Specimen: Nasal Mucosa; Nasal Swab  Result Value Ref Range Status   MRSA by PCR Next Gen NOT DETECTED NOT DETECTED Final    Comment: (NOTE) The GeneXpert MRSA Assay (FDA approved for NASAL specimens only), is one component of a comprehensive MRSA colonization surveillance program. It is not intended to diagnose MRSA infection nor to guide or monitor treatment for MRSA infections. Test performance is not FDA approved in patients less than 109 years old. Performed at [facility], [address], [place], [place] [address]   Urine Culture     Status: None   Collection Time: 07/23/21  6:35 PM   Specimen: Urine, Clean Catch  Result Value Ref Range Status   Specimen Description   Final    URINE, CLEAN CATCH Performed at [facility], [address], [place], [place] [address]    Special Requests   Final    NONE Performed at [facility], [address] [place], [place] [address]    Culture   Final    NO GROWTH Performed at [facility], [place] [address] [place], [place] [address]    Report Status 07/25/2021 FINAL  Final   Time spent: 30 min  SIGNED:   Marylu Lund, MD  [facility] 07/25/2021, 1:23 PM  If 7PM-7AM, please contact night-coverage

## 2021-07-25 NOTE — TOC Transition Note (Deleted)
Transition of Care [facility]) - CM/SW Discharge Note   Patient Details  Name: [name] MRN: [id number] Date of Birth: 08/23/1961  [facility] CM/SW Contact:  Natasha Bence, LCSW Phone Number: 07/25/2021, 2:55 PM   Clinical Narrative:    CSW notified of patient's readiness for discharge. CSW refferred patient to [facility], [facility], and [facility]. [facility] agencies not able to take patient. Patient agreeable to OPPT. CSW referred patient to OPPT. TOC to follow.    Final next level of care: OP Rehab Barriers to Discharge: Barriers Resolved   Patient Goals and CMS Choice Patient states their goals for this hospitalization and ongoing recovery are:: Return home CMS Medicare.gov Compare Post Acute Care list provided to:: Patient Represenative (must comment) Choice offered to / list presented to : Patient, Spouse  Discharge Placement                    Patient and family notified of of transfer: 07/25/21  Discharge Plan and Services In-house Referral: Clinical Social Work Discharge Planning Services: CM Consult                                 Social Determinants of Health (SDOH) Interventions     Readmission Risk Interventions Readmission Risk Prevention Plan 07/22/2021 03/24/2021 12/10/2020  Transportation Screening Complete Complete Complete  HRI or Home Care Consult Complete - Complete  Social Work Consult for [place] Planning/Counseling Complete - Complete  Palliative Care Screening Not Applicable - Not Applicable  Medication Review Press photographer) Complete Complete Complete  PCP or Specialist appointment within 3-5 days of discharge - Complete -  [place] or [place] - Complete -  SW Recovery Care/Counseling Consult - Complete -  Palliative Care Screening - Not Applicable -  [place] - Not Applicable -  Some recent data might be hidden

## 2021-07-25 NOTE — Discharge Instructions (Signed)
Resume your aspirin on 8/14

## 2021-07-25 NOTE — TOC Transition Note (Signed)
Transition of Care [facility]) - CM/SW Discharge Note   Patient Details  Name: [name] MRN: [id number] Date of Birth: Jun 05, 1961  Transition of Care [facility]) CM/SW Contact:  Natasha Bence, LCSW Phone Number: 07/25/2021, 3:06 PM   Clinical Narrative:    CSW notified of patient's readiness for discharge. Patient agreeable to [facility] referral. CSW referred patient to [place] with Alvis Lemmings. Georgina Snell agreeable to provide services TOC signing off.   Final next level of care: [place] Barriers to Discharge: Barriers Resolved   Patient Goals and CMS Choice Patient states their goals for this hospitalization and ongoing recovery are:: Home with [facility] CMS Medicare.gov Compare Post Acute Care list provided to:: Patient Choice offered to / list presented to : Patient  Discharge Placement                    Patient and family notified of of transfer: 07/25/21  Discharge Plan and Services In-house Referral: Clinical Social Work Discharge Planning Services: CM Consult                      HH Arranged: PT [place] Agency: [place] Date [place]: 07/25/21 Time [place]: [phone] Representative spoke with at [place]: [place] Determinants of Health ([place]) Interventions     Readmission Risk Interventions Readmission Risk Prevention Plan 07/22/2021 03/24/2021 12/10/2020  Transportation Screening Complete Complete Complete  HRI or Home Care Consult Complete - Complete  Social Work Consult for [place] Planning/Counseling Complete - Complete  Palliative Care Screening Not Applicable - Not Applicable  Medication Review Press photographer) Complete Complete Complete  PCP or Specialist appointment within 3-5 days of discharge - Complete -  [place] or [place] - Complete -  SW Recovery Care/Counseling Consult - Complete -  [place] - Not Applicable -  Some recent data  might be hidden

## 2021-07-27 ENCOUNTER — Telehealth: Payer: Self-pay | Admitting: Cardiology

## 2021-07-27 NOTE — Telephone Encounter (Signed)
Pt c/o swelling: STAT is pt has developed SOB within 24 hours  If swelling, where is the swelling located? ALL OVER ABDOMIN IS SWOLLEN FEET AND LEGS ARE SWOLLEN  How much weight have you gained and in what time span? 10LBS   Have you gained 3 pounds in a day or 5 pounds in a week? YES  Do you have a log of your daily weights (if so, list) 255 , 260 265   Are you currently taking a fluid pill? YES 1X A DAY  Are you currently SOB? NO  Have you traveled recently? NO

## 2021-07-27 NOTE — Telephone Encounter (Signed)
I spoke with patient. He will take torsemide 40 mg daily for the next 3-4 days or shorter term if weight drops back to baseline. He will call us with update.

## 2021-07-27 NOTE — Telephone Encounter (Signed)
Patient taking Torsemide 20 mg daily

## 2021-07-28 ENCOUNTER — Encounter (HOSPITAL_COMMUNITY): Payer: Self-pay | Admitting: Emergency Medicine

## 2021-07-28 ENCOUNTER — Other Ambulatory Visit: Payer: Self-pay

## 2021-07-28 ENCOUNTER — Emergency Department (HOSPITAL_COMMUNITY): Payer: Federal, State, Local not specified - PPO

## 2021-07-28 ENCOUNTER — Emergency Department (HOSPITAL_COMMUNITY)
Admission: EM | Admit: 2021-07-28 | Discharge: 2021-07-28 | Disposition: A | Discharge status: Home / Self Care | Payer: Federal, State, Local not specified - PPO | Attending: Emergency Medicine | Admitting: Emergency Medicine

## 2021-07-28 DIAGNOSIS — D631 Anemia in chronic kidney disease: Secondary | ICD-10-CM | POA: Diagnosis not present

## 2021-07-28 DIAGNOSIS — I25118 Atherosclerotic heart disease of native coronary artery with other forms of angina pectoris: Secondary | ICD-10-CM | POA: Diagnosis not present

## 2021-07-28 DIAGNOSIS — I8500 Esophageal varices without bleeding: Secondary | ICD-10-CM | POA: Diagnosis not present

## 2021-07-28 DIAGNOSIS — E876 Hypokalemia: Secondary | ICD-10-CM | POA: Diagnosis not present

## 2021-07-28 DIAGNOSIS — R0602 Shortness of breath: Secondary | ICD-10-CM | POA: Diagnosis not present

## 2021-07-28 DIAGNOSIS — Z955 Presence of coronary angioplasty implant and graft: Secondary | ICD-10-CM | POA: Insufficient documentation

## 2021-07-28 DIAGNOSIS — R140 Abdominal distension (gaseous): Secondary | ICD-10-CM | POA: Diagnosis not present

## 2021-07-28 DIAGNOSIS — Z79899 Other long term (current) drug therapy: Secondary | ICD-10-CM | POA: Insufficient documentation

## 2021-07-28 DIAGNOSIS — N1832 Chronic kidney disease, stage 3b: Secondary | ICD-10-CM | POA: Insufficient documentation

## 2021-07-28 DIAGNOSIS — K7581 Nonalcoholic steatohepatitis (NASH): Secondary | ICD-10-CM | POA: Diagnosis not present

## 2021-07-28 DIAGNOSIS — E1140 Type 2 diabetes mellitus with diabetic neuropathy, unspecified: Secondary | ICD-10-CM | POA: Diagnosis not present

## 2021-07-28 DIAGNOSIS — I5022 Chronic systolic (congestive) heart failure: Secondary | ICD-10-CM | POA: Diagnosis not present

## 2021-07-28 DIAGNOSIS — I2510 Atherosclerotic heart disease of native coronary artery without angina pectoris: Secondary | ICD-10-CM | POA: Diagnosis not present

## 2021-07-28 DIAGNOSIS — D62 Acute posthemorrhagic anemia: Secondary | ICD-10-CM | POA: Diagnosis not present

## 2021-07-28 DIAGNOSIS — K7291 Hepatic failure, unspecified with coma: Secondary | ICD-10-CM | POA: Diagnosis not present

## 2021-07-28 DIAGNOSIS — E785 Hyperlipidemia, unspecified: Secondary | ICD-10-CM | POA: Diagnosis not present

## 2021-07-28 DIAGNOSIS — Z7982 Long term (current) use of aspirin: Secondary | ICD-10-CM | POA: Insufficient documentation

## 2021-07-28 DIAGNOSIS — Z1331 Encounter for screening for depression: Secondary | ICD-10-CM | POA: Diagnosis not present

## 2021-07-28 DIAGNOSIS — I130 Hypertensive heart and chronic kidney disease with heart failure and stage 1 through stage 4 chronic kidney disease, or unspecified chronic kidney disease: Secondary | ICD-10-CM | POA: Insufficient documentation

## 2021-07-28 DIAGNOSIS — Z794 Long term (current) use of insulin: Secondary | ICD-10-CM | POA: Insufficient documentation

## 2021-07-28 DIAGNOSIS — K7460 Unspecified cirrhosis of liver: Secondary | ICD-10-CM | POA: Diagnosis not present

## 2021-07-28 DIAGNOSIS — I5033 Acute on chronic diastolic (congestive) heart failure: Secondary | ICD-10-CM | POA: Diagnosis not present

## 2021-07-28 DIAGNOSIS — Z96652 Presence of left artificial knee joint: Secondary | ICD-10-CM | POA: Insufficient documentation

## 2021-07-28 DIAGNOSIS — Z6841 Body Mass Index (BMI) 40.0 and over, adult: Secondary | ICD-10-CM | POA: Diagnosis not present

## 2021-07-28 DIAGNOSIS — Z951 Presence of aortocoronary bypass graft: Secondary | ICD-10-CM | POA: Diagnosis not present

## 2021-07-28 DIAGNOSIS — E1122 Type 2 diabetes mellitus with diabetic chronic kidney disease: Secondary | ICD-10-CM | POA: Insufficient documentation

## 2021-07-28 DIAGNOSIS — J811 Chronic pulmonary edema: Secondary | ICD-10-CM | POA: Diagnosis not present

## 2021-07-28 DIAGNOSIS — E2740 Unspecified adrenocortical insufficiency: Secondary | ICD-10-CM | POA: Diagnosis not present

## 2021-07-28 DIAGNOSIS — I110 Hypertensive heart disease with heart failure: Secondary | ICD-10-CM | POA: Diagnosis not present

## 2021-07-28 DIAGNOSIS — I5030 Unspecified diastolic (congestive) heart failure: Secondary | ICD-10-CM | POA: Diagnosis not present

## 2021-07-28 DIAGNOSIS — K219 Gastro-esophageal reflux disease without esophagitis: Secondary | ICD-10-CM | POA: Insufficient documentation

## 2021-07-28 DIAGNOSIS — R531 Weakness: Secondary | ICD-10-CM | POA: Diagnosis not present

## 2021-07-28 DIAGNOSIS — R5381 Other malaise: Secondary | ICD-10-CM | POA: Diagnosis not present

## 2021-07-28 DIAGNOSIS — J683 Other acute and subacute respiratory conditions due to chemicals, gases, fumes and vapors: Secondary | ICD-10-CM | POA: Diagnosis not present

## 2021-07-28 LAB — CBC WITH DIFFERENTIAL/PLATELET
Abs Immature Granulocytes: 0.04 10*3/uL (ref 0.00–0.07)
Basophils Absolute: 0 10*3/uL (ref 0.0–0.1)
Basophils Relative: 0 %
Eosinophils Absolute: 0.2 10*3/uL (ref 0.0–0.5)
Eosinophils Relative: 3 %
HCT: 32.6 % — ABNORMAL LOW (ref 39.0–52.0)
Hemoglobin: 9.6 g/dL — ABNORMAL LOW (ref 13.0–17.0)
Immature Granulocytes: 0 %
Lymphocytes Relative: 15 %
Lymphs Abs: 1.3 10*3/uL (ref 0.7–4.0)
MCH: 28.6 pg (ref 26.0–34.0)
MCHC: 29.4 g/dL — ABNORMAL LOW (ref 30.0–36.0)
MCV: 97 fL (ref 80.0–100.0)
Monocytes Absolute: 0.8 10*3/uL (ref 0.1–1.0)
Monocytes Relative: 8 %
Neutro Abs: 6.6 10*3/uL (ref 1.7–7.7)
Neutrophils Relative %: 74 %
Platelets: 74 10*3/uL — ABNORMAL LOW (ref 150–400)
RBC: 3.36 MIL/uL — ABNORMAL LOW (ref 4.22–5.81)
RDW: 21.9 % — ABNORMAL HIGH (ref 11.5–15.5)
WBC: 8.9 10*3/uL (ref 4.0–10.5)
nRBC: 0 % (ref 0.0–0.2)

## 2021-07-28 LAB — BASIC METABOLIC PANEL
Anion gap: 7 (ref 5–15)
BUN: 20 mg/dL (ref 8–23)
CO2: 27 mmol/L (ref 22–32)
Calcium: 8 mg/dL — ABNORMAL LOW (ref 8.9–10.3)
Chloride: 100 mmol/L (ref 98–111)
Creatinine, Ser: 1.24 mg/dL (ref 0.61–1.24)
GFR, Estimated: >60 mL/min (ref >60)
Glucose, Bld: 214 mg/dL — ABNORMAL HIGH (ref 70–99)
Potassium: 3.2 mmol/L — ABNORMAL LOW (ref 3.5–5.1)
Sodium: 134 mmol/L — ABNORMAL LOW (ref 135–145)

## 2021-07-28 LAB — BRAIN NATRIURETIC PEPTIDE: B Natriuretic Peptide: 351 pg/mL — ABNORMAL HIGH (ref 0.0–100.0)

## 2021-07-28 MED ORDER — FUROSEMIDE 10 MG/ML IJ SOLN
80.0000 mg | Freq: Once | INTRAMUSCULAR | Status: AC
  Administered 2021-07-28: 80 mg via INTRAVENOUS
  Filled 2021-07-28: qty 8

## 2021-07-28 NOTE — ED Triage Notes (Signed)
Pt recently discharged from hospital Saturday. Pt sent today by PCP for 35lb weight gain since discharge. Pt c/o some shortness of breath.

## 2021-07-28 NOTE — ED Provider Notes (Signed)
[place] Provider Note   CSN: [id number] Arrival date & time: 07/28/21  1422     History Chief Complaint  Patient presents with   Shortness of Breath    [name] is a 60 y.o. male.  Patient recently discharged from the hospital on Saturday.  Patient states he has had a 35 pound weight gain since discharge.  Is having some shortness of breath but not severe shortness of breath.  Heart rate on arrival was 73 oxygen sats were 100%.  Blood pressure was 104/64.  Patient was admitted from August 9 August 13 he was admitted for altered mental status and for a low hemoglobin.  Hemoglobin at time of admission was 6.7.  An ammonia level was 68.  Patient is also known to have ascites.  Known to have diastolic congestive heart failure.  He was discharged on furosemide 20 mg.  Patient states that he just started taking it today.  Past medical history significant for hypertension hyperlipidemia type 2 diabetes cirrhosis coronary artery disease.  Patient denies any chest pain.  Patient also states that he has had increased leg swelling.      Past Medical History:  Diagnosis Date   Anemia    Arthritis    CAD (coronary artery disease)    Multivessel disease status post CABG 08/2015   Cirrhosis ([place])    CKD (chronic kidney disease) stage 3, GFR 30-59 ml/min (HCC)    Diastolic CHF ([place])    Essential hypertension    Hyperlipidemia    Iron deficiency anemia 07/15/2021   OSA on CPAP    Polyclonal gammopathy    Thrombocytopenia (HCC) 2016   Type 2 diabetes mellitus ([place])     Patient Active Problem List   Diagnosis Date Noted   Acute metabolic encephalopathy 01/60/1093   Chronic kidney disease 07/21/2021   Pure hypercholesterolemia 07/21/2021   GI bleed 07/21/2021   Iron deficiency anemia 07/15/2021   Hyperkalemia 03/17/2021   Elevated troponin 03/17/2021   Back pain 03/17/2021   Hypertensive heart and kidney disease with heart failure and with chronic kidney  disease stage III ([place]) 12/17/2020   Chronic diastolic heart failure ([place]) 12/17/2020   CKD stage 3 due to type 2 diabetes mellitus ([place]) 12/17/2020   Hyperlipidemia associated with type 2 diabetes mellitus ([place]) 12/17/2020   Anxiety 12/17/2020   Chronic non-seasonal allergic rhinitis 12/17/2020   Hypotension 12/10/2020   Acute renal failure superimposed on stage 3b chronic kidney disease ([place]) 12/10/2020   Acute renal failure superimposed on stage 3b chronic kidney disease, unspecified acute renal failure type ([place]) 12/10/2020   AKI (acute kidney injury) ([place]) 12/09/2020   GERD (gastroesophageal reflux disease) 10/09/2020   Portal hypertension ([place]) 08/26/2020   Pre-transplant evaluation for liver transplant 08/26/2020   Atherosclerotic heart disease of native coronary artery without angina pectoris 07/17/2020   Unspecified cirrhosis of liver ([place]) 07/17/2020   Anasarca 07/17/2020   Heart failure, unspecified ([place]) 07/17/2020   Morbid obesity ([place]) 07/17/2020   Acute on chronic heart failure with preserved ejection fraction (HCC)    Liver failure ([place]) 06/20/2020   Esophageal varices ([place]) 01/09/2020   History of adenomatous polyp of colon 01/09/2020   Elevated AST (SGOT) 08/23/2019   Elevation of level of transaminase and lactic acid dehydrogenase (LDH) 08/23/2019   Diarrhea 07/17/2019   Hypersomnia, persistent 03/12/2019   Macrocytic anemia 01/18/2019   Other pancytopenia ([place]) 01/18/2019   Anemia 01/18/2019   Thrombocytopenia ([place]) 12/30/2018   Polyclonal gammopathy  12/30/2018   S/P total knee replacement, left 03/09/17 11/22/2017   Primary osteoarthritis of left knee 03/09/2017   History of nocturia 03/02/2017   OSA on CPAP 03/02/2016   Hypoxia 03/02/2016   Obstructive sleep apnea (adult) (pediatric) 03/02/2016   Cyst of mediastinum 01/15/2016   S/P CABG x 4 08/29/2015   Obesity, Class III, BMI 40-49.9 (morbid obesity) ([place])    Hyperglycemia    Pain in the chest  08/23/2015   Non-ST elevation MI (NSTEMI) ([place]) 08/23/2015   Diabetes mellitus type 2, uncontrolled ([place]) 08/23/2015   Essential hypertension 08/23/2015   Hyperlipidemia 08/23/2015   Type 2 diabetes mellitus with hypoglycemia with coma ([place]) 08/23/2015   Myocardial infarction ([place]) 08/23/2015   Type 2 diabetes mellitus with hyperglycemia ([place]) 2016   Class 2 obesity 2016   Hyponatremia 2016   Osteoarthritis, knee 05/24/2012   Knee pain 10/29/2011   Knee stiffness 10/29/2011   S/P right knee arthroscopy 10/26/2011   Medial meniscus, posterior horn derangement 09/07/2011   Lateral meniscus derangement 09/07/2011   OA (osteoarthritis) of knee 09/07/2011   Rotator cuff syndrome of left shoulder 08/19/2011   Right knee meniscal tear 08/19/2011    Past Surgical History:  Procedure Laterality Date   APPENDECTOMY     Biceps tendon surgery Right    BIOPSY  11/22/2019   Procedure: BIOPSY;  Surgeon: Daneil Dolin, MD;  [facility];  Service: Endoscopy;;   CARDIAC CATHETERIZATION N/A 08/25/2015   Procedure: Left Heart Cath and Coronary Angiography;  Surgeon: Belva Crome, MD;  Location: [facility] CV LAB;  Service: Cardiovascular;  Laterality: N/A;   COLONOSCOPY WITH PROPOFOL N/A 11/22/2019   Procedure: COLONOSCOPY WITH PROPOFOL;  Surgeon: Daneil Dolin, MD; Four 4-5 mm polyps, findings suggestive of portal colopathy, congested appearing colonic mucosa diffusely, rectal varices, and adequate right colon prep.  Pathology with tubular adenomas and hyperplastic polyp.  Right colon biopsy with focal active colitis.  Recommendations to repeat colonoscopy in 3 months due to poor prep.   COLONOSCOPY WITH PROPOFOL N/A 03/17/2020   Procedure: COLONOSCOPY WITH PROPOFOL;  Surgeon: Daneil Dolin, MD;  [facility];  Service: Endoscopy;  Laterality: N/A;  8:45am - pt does not need covid test, was + 2/4 <90 days   CORONARY ARTERY BYPASS GRAFT N/A 08/29/2015   Procedure: CORONARY  ARTERY BYPASS GRAFTING (CABG);  Surgeon: [name], MD;  Location: [facility];  Service: Open Heart Surgery;  Laterality: N/A;   ESOPHAGEAL BANDING N/A 07/23/2021   Procedure: ESOPHAGEAL BANDING;  Surgeon: Eloise Harman, DO;  [facility];  Service: Endoscopy;  Laterality: N/A;   ESOPHAGOGASTRODUODENOSCOPY (EGD) WITH PROPOFOL N/A 11/22/2019   Procedure: ESOPHAGOGASTRODUODENOSCOPY (EGD) WITH PROPOFOL;  Surgeon: Daneil Dolin, MD; 4 columns of grade 2-3 esophageal varices, portal gastropathy, gastric polyp/abnormal gastric mucosa s/p biopsy.  Pathology with hyperplastic polyp, mild chronic gastritis, negative H. pylori.   ESOPHAGOGASTRODUODENOSCOPY (EGD) WITH PROPOFOL N/A 07/23/2021   Procedure: ESOPHAGOGASTRODUODENOSCOPY (EGD) WITH PROPOFOL;  Surgeon: Eloise Harman, DO;  [facility];  Service: Endoscopy;  Laterality: N/A;   KNEE ARTHROSCOPY Left    TEE WITHOUT CARDIOVERSION N/A 08/29/2015   Procedure: TRANSESOPHAGEAL ECHOCARDIOGRAM (TEE);  Surgeon: [name], MD;  Location: [facility];  Service: Open Heart Surgery;  Laterality: N/A;   TOTAL KNEE ARTHROPLASTY Left 03/09/2017   Procedure: TOTAL KNEE ARTHROPLASTY;  Surgeon: Carole Civil, MD;  Location: [facility];  Service: Orthopedics;  Laterality: Left;  Family History  Problem Relation Age of Onset   Arthritis Other    Cancer Other    Diabetes Other    CAD Father    Diabetes Mellitus II Father    Liver cancer Father    Hodgkin's lymphoma Father    CAD Brother    Diabetes Mellitus II Brother    ALS Mother    Diabetes Mellitus II Sister    Diabetes Mellitus II Brother    Diabetes Mellitus II Maternal Grandmother    Aneurysm Maternal Grandmother    Cancer Maternal Grandfather    Anesthesia problems Neg Hx    Hypotension Neg Hx    Malignant hyperthermia Neg Hx    Pseudochol deficiency Neg Hx    Colon cancer Neg Hx     Social History   Tobacco Use   Smoking status: Never    Smokeless tobacco: Never  Vaping Use   Vaping Use: Never used  Substance Use Topics   Alcohol use: Not Currently   Drug use: No    Home Medications Prior to Admission medications   Medication Sig Start Date End Date Taking? Authorizing Provider  acetaminophen (TYLENOL) 325 MG tablet Take 650 mg by mouth every 6 (six) hours as needed for mild pain. 12/19/20  Yes [provider]  albuterol (VENTOLIN HFA) 108 (90 Base) MCG/ACT inhaler Inhale 2 puffs into the lungs every 6 (six) hours as needed for wheezing or shortness of breath. 12/22/20  Yes Gerlene Fee, NP  ALPRAZolam Duanne Moron) 0.5 MG tablet Take 1 tablet (0.5 mg total) by mouth daily as needed. 12/22/20  Yes Gerlene Fee, NP  aspirin EC 81 MG tablet Take 81 mg by mouth at bedtime.    Yes [provider]  Cholecalciferol (DIALYVITE VITAMIN D 5000) 125 MCG (5000 UT) capsule Take 5,000 Units by mouth in the morning.    Yes [provider]  ferrous sulfate 325 (65 FE) MG tablet Take 325 mg by mouth in the morning.    Yes [provider]  Flaxseed, Linseed, (FLAXSEED OIL) 1000 MG CAPS Take 1,000 mg by mouth at bedtime. 12/16/20  Yes [provider]  insulin aspart (NOVOLOG) 100 UNIT/ML injection Inject 5 Units into the skin 3 (three) times daily before meals. Special Instructions: If accu-check is greater than 150. Hold for accu-check 150 or below. With Meals 12/22/20  Yes Gerlene Fee, NP  insulin degludec (TRESIBA FLEXTOUCH) 200 UNIT/ML FlexTouch Pen Inject 40 Units into the skin in the morning. Give 30 units Subcutaneous  at Bedtime 8:00 pm 12/22/20  Yes Gerlene Fee, NP  lactulose (CHRONULAC) 10 GM/15ML solution Take 30 mLs (20 g total) by mouth daily. 12/22/20  Yes Gerlene Fee, NP  loratadine (CLARITIN) 10 MG tablet Take 10 mg by mouth in the morning.    Yes [provider]  Magnesium 250 MG TABS Take 250 mg by mouth daily.   Yes [provider]  Misc Natural Products  (ELDERBERRY ZINC/VIT C/IMMUNE) LOZG Take 1 lozenge by mouth at bedtime. 12/16/20  Yes [provider]  nadolol (CORGARD) 20 MG tablet Take by mouth daily. 12/18/20  Yes [provider]  OZEMPIC, 1 MG/DOSE, 4 MG/3ML SOPN Inject 1 mg into the skin once a week. 12/22/20  Yes Gerlene Fee, NP  pantoprazole (PROTONIX) 40 MG tablet Take 1 tablet (40 mg total) by mouth 2 (two) times daily before a meal. 07/25/21 09/19/21 Yes Donne Hazel, MD  Potassium Chloride ER 20  MEQ TBCR Take 1 tablet by mouth daily. 07/26/21  Yes [provider]  pravastatin (PRAVACHOL) 20 MG tablet TAKE 1 TABLET(20 MG) BY MOUTH AT BEDTIME 12/22/20  Yes Gerlene Fee, NP  sucralfate (CARAFATE) 1 g tablet Take 1 tablet (1 g total) by mouth 4 (four) times daily for 14 days. 07/25/21 08/08/21 Yes Donne Hazel, MD  TURMERIC CURCUMIN PO Take 2,000 mg by mouth at bedtime.    Yes [provider]  venlafaxine XR (EFFEXOR-XR) 37.5 MG 24 hr capsule Take 37.5 mg by mouth daily. 04/01/21  Yes [provider]  Continuous Blood Gluc Receiver (FREESTYLE LIBRE 14 DAY READER) DEVI 1 reader 12/22/20   Gerlene Fee, NP  Continuous Blood Gluc Sensor (FREESTYLE LIBRE 14 DAY SENSOR) MISC 1 sensor 12/28/17   [provider]  Elastic Bandages & Supports ([place]) [place] 1 each by Does not apply route as directed. Low pressure knee high compression stockings Dx: leg swelling 05/13/20   Satira Sark, MD  INS SYRINGE/NEEDLE .5CC/28G 28G X 1/2" 0.5 ML MISC Inject 1 Syringe into the skin See admin instructions. 12/22/20   Gerlene Fee, NP  lidocaine (XYLOCAINE) 2 % solution 5 mLs as needed. Patient not taking: Reported on 07/28/2021 06/18/21   [provider]    Allergies    Fluticasone  Review of Systems   Review of Systems  Constitutional:  Positive for unexpected weight change. Negative for chills and fever.  HENT:  Negative for ear pain and sore throat.   Eyes:   Negative for pain and visual disturbance.  Respiratory:  Positive for shortness of breath. Negative for cough.   Cardiovascular:  Positive for leg swelling. Negative for chest pain and palpitations.  Gastrointestinal:  Negative for abdominal pain and vomiting.  Genitourinary:  Negative for dysuria and hematuria.  Musculoskeletal:  Negative for arthralgias and back pain.  Skin:  Negative for color change and rash.  Neurological:  Negative for seizures and syncope.  All other systems reviewed and are negative.  Physical Exam Updated Vital Signs BP 129/79   Pulse 65   Temp 98.3 F (36.8 C) (Oral)   Resp 14   Ht 1.778 m (5\' 10" )   Wt 131.5 kg   SpO2 100%   BMI 41.61 kg/m   Physical Exam Vitals and nursing note reviewed.  Constitutional:      General: He is not in acute distress.    Appearance: Normal appearance. He is well-developed.  HENT:     Head: Normocephalic and atraumatic.  Eyes:     Extraocular Movements: Extraocular movements intact.     Conjunctiva/sclera: Conjunctivae normal.     Pupils: Pupils are equal, round, and reactive to light.  Cardiovascular:     Rate and Rhythm: Normal rate and regular rhythm.     Heart sounds: No murmur heard. Pulmonary:     Effort: Pulmonary effort is normal. No respiratory distress.     Breath sounds: Normal breath sounds. No wheezing, rhonchi or rales.  Abdominal:     General: There is distension.     Palpations: Abdomen is soft.     Tenderness: There is no abdominal tenderness.  Musculoskeletal:     Cervical back: Normal range of motion and neck supple.     Right lower leg: Edema present.     Left lower leg: Edema present.  Skin:    General: Skin is warm and dry.  Neurological:     General: No focal deficit  present.     Mental Status: He is alert and oriented to person, place, and time.    ED Results / Procedures / Treatments   Labs (all labs ordered are listed, but only abnormal results are displayed) Labs Reviewed   CBC WITH DIFFERENTIAL/PLATELET - Abnormal; Notable for the following components:      Result Value   RBC 3.36 (*)    Hemoglobin 9.6 (*)    HCT 32.6 (*)    MCHC 29.4 (*)    RDW 21.9 (*)    Platelets 74 (*)    All other components within normal limits  BASIC METABOLIC PANEL - Abnormal; Notable for the following components:   Sodium 134 (*)    Potassium 3.2 (*)    Glucose, Bld 214 (*)    Calcium 8.0 (*)    All other components within normal limits  BRAIN NATRIURETIC PEPTIDE - Abnormal; Notable for the following components:   B Natriuretic Peptide 351.0 (*)    All other components within normal limits    EKG None  Radiology DG Chest Portable 1 View  Result Date: 07/28/2021 CLINICAL DATA:  Shortness of breath. released from the hospital on Saturday and has gained 35 lbs in fluid since EXAM: PORTABLE CHEST 1 VIEW COMPARISON:  Chest x-ray 10/28/2020, chest x-ray 03/17/2021 FINDINGS: The heart size and mediastinal contours are unchanged. Aortic calcification. No focal consolidation. Increased interstitial markings. No pleural effusion. No pneumothorax. No acute osseous abnormality. IMPRESSION: Mild pulmonary edema. No definite pleural effusions with limited evaluation on this [facility] view. Electronically Signed   By: Iven Finn M.D.   On: 07/28/2021 15:48    Procedures Procedures   Medications Ordered in ED Medications  furosemide (LASIX) injection 80 mg (80 mg Intravenous Given 07/28/21 1950)    ED Course  I have reviewed the triage vital signs and the nursing notes.  Pertinent labs & imaging results that were available during my care of the patient were reviewed by me and considered in my medical decision making (see chart for details).    MDM Rules/Calculators/A&P                         CRITICAL CARE Performed by: Fredia Sorrow Total critical care time: 35 minutes Critical care time was exclusive of separately billable procedures and treating other patients. Critical  care was necessary to treat or prevent imminent or life-threatening deterioration. Critical care was time spent personally by me on the following activities: development of treatment plan with patient and/or surrogate as well as nursing, discussions with consultants, evaluation of patient's response to treatment, examination of patient, obtaining history from patient or surrogate, ordering and performing treatments and interventions, ordering and review of laboratory studies, ordering and review of radiographic studies, pulse oximetry and re-evaluation of patient's condition.   Patient's chest x-ray shows some mild pulmonary edema.  Patient's hemoglobin is much better following his recent admission is 9.6 today.  Potassium slightly low at 3.2.  Patient's creatinine is 1.24.  Patient on exam did have bilateral lower extremity edema.  Probably a little bit fluid overloaded.  And then the chest x-ray showing some mild pulmonary edema.  Patient given 80 mg of Lasix diuresed well.  Feeling better.  Will have follow-up with his primary care doctor this week.  And also gave referral to cardiology.  Patient not having any chest pain.   Final Clinical Impression(s) / ED Diagnoses Final diagnoses:  Acute on chronic diastolic congestive  heart failure [facility])    Rx / DC Orders ED Discharge Orders     None        Fredia Sorrow, MD 07/28/21 2137

## 2021-07-28 NOTE — Discharge Instructions (Addendum)
Continue to take your furosemide as scheduled.  To help get the fluid off.  Make an appointment to follow-up with your primary care doctor an appointment to follow-up with cardiology for the congestive heart failure.  Return for any new or worse symptoms.

## 2021-07-29 ENCOUNTER — Ambulatory Visit (HOSPITAL_COMMUNITY): Payer: Federal, State, Local not specified - PPO

## 2021-07-30 DIAGNOSIS — E1122 Type 2 diabetes mellitus with diabetic chronic kidney disease: Secondary | ICD-10-CM | POA: Diagnosis not present

## 2021-07-30 DIAGNOSIS — D631 Anemia in chronic kidney disease: Secondary | ICD-10-CM | POA: Diagnosis not present

## 2021-07-30 DIAGNOSIS — D62 Acute posthemorrhagic anemia: Secondary | ICD-10-CM | POA: Diagnosis not present

## 2021-07-30 DIAGNOSIS — I5033 Acute on chronic diastolic (congestive) heart failure: Secondary | ICD-10-CM | POA: Diagnosis not present

## 2021-07-30 DIAGNOSIS — Z951 Presence of aortocoronary bypass graft: Secondary | ICD-10-CM | POA: Diagnosis not present

## 2021-07-30 DIAGNOSIS — I130 Hypertensive heart and chronic kidney disease with heart failure and stage 1 through stage 4 chronic kidney disease, or unspecified chronic kidney disease: Secondary | ICD-10-CM | POA: Diagnosis not present

## 2021-07-30 DIAGNOSIS — Z794 Long term (current) use of insulin: Secondary | ICD-10-CM | POA: Diagnosis not present

## 2021-07-30 DIAGNOSIS — E785 Hyperlipidemia, unspecified: Secondary | ICD-10-CM | POA: Diagnosis not present

## 2021-07-30 DIAGNOSIS — I2510 Atherosclerotic heart disease of native coronary artery without angina pectoris: Secondary | ICD-10-CM | POA: Diagnosis not present

## 2021-07-30 DIAGNOSIS — E876 Hypokalemia: Secondary | ICD-10-CM | POA: Diagnosis not present

## 2021-07-30 DIAGNOSIS — N1832 Chronic kidney disease, stage 3b: Secondary | ICD-10-CM | POA: Diagnosis not present

## 2021-07-30 DIAGNOSIS — E2740 Unspecified adrenocortical insufficiency: Secondary | ICD-10-CM | POA: Diagnosis not present

## 2021-07-30 DIAGNOSIS — K7460 Unspecified cirrhosis of liver: Secondary | ICD-10-CM | POA: Diagnosis not present

## 2021-07-30 DIAGNOSIS — I8500 Esophageal varices without bleeding: Secondary | ICD-10-CM | POA: Diagnosis not present

## 2021-07-30 DIAGNOSIS — K7291 Hepatic failure, unspecified with coma: Secondary | ICD-10-CM | POA: Diagnosis not present

## 2021-07-30 DIAGNOSIS — Z7982 Long term (current) use of aspirin: Secondary | ICD-10-CM | POA: Diagnosis not present

## 2021-08-04 DIAGNOSIS — I8500 Esophageal varices without bleeding: Secondary | ICD-10-CM | POA: Diagnosis not present

## 2021-08-04 DIAGNOSIS — E876 Hypokalemia: Secondary | ICD-10-CM | POA: Diagnosis not present

## 2021-08-04 DIAGNOSIS — E1122 Type 2 diabetes mellitus with diabetic chronic kidney disease: Secondary | ICD-10-CM | POA: Diagnosis not present

## 2021-08-04 DIAGNOSIS — Z951 Presence of aortocoronary bypass graft: Secondary | ICD-10-CM | POA: Diagnosis not present

## 2021-08-04 DIAGNOSIS — N1832 Chronic kidney disease, stage 3b: Secondary | ICD-10-CM | POA: Diagnosis not present

## 2021-08-04 DIAGNOSIS — I130 Hypertensive heart and chronic kidney disease with heart failure and stage 1 through stage 4 chronic kidney disease, or unspecified chronic kidney disease: Secondary | ICD-10-CM | POA: Diagnosis not present

## 2021-08-04 DIAGNOSIS — D631 Anemia in chronic kidney disease: Secondary | ICD-10-CM | POA: Diagnosis not present

## 2021-08-04 DIAGNOSIS — I5033 Acute on chronic diastolic (congestive) heart failure: Secondary | ICD-10-CM | POA: Diagnosis not present

## 2021-08-04 DIAGNOSIS — D62 Acute posthemorrhagic anemia: Secondary | ICD-10-CM | POA: Diagnosis not present

## 2021-08-04 DIAGNOSIS — I2510 Atherosclerotic heart disease of native coronary artery without angina pectoris: Secondary | ICD-10-CM | POA: Diagnosis not present

## 2021-08-04 DIAGNOSIS — Z7982 Long term (current) use of aspirin: Secondary | ICD-10-CM | POA: Diagnosis not present

## 2021-08-04 DIAGNOSIS — Z794 Long term (current) use of insulin: Secondary | ICD-10-CM | POA: Diagnosis not present

## 2021-08-04 DIAGNOSIS — E785 Hyperlipidemia, unspecified: Secondary | ICD-10-CM | POA: Diagnosis not present

## 2021-08-04 DIAGNOSIS — E2740 Unspecified adrenocortical insufficiency: Secondary | ICD-10-CM | POA: Diagnosis not present

## 2021-08-04 DIAGNOSIS — K7460 Unspecified cirrhosis of liver: Secondary | ICD-10-CM | POA: Diagnosis not present

## 2021-08-04 DIAGNOSIS — K7291 Hepatic failure, unspecified with coma: Secondary | ICD-10-CM | POA: Diagnosis not present

## 2021-08-05 DIAGNOSIS — N1832 Chronic kidney disease, stage 3b: Secondary | ICD-10-CM | POA: Diagnosis not present

## 2021-08-05 DIAGNOSIS — I130 Hypertensive heart and chronic kidney disease with heart failure and stage 1 through stage 4 chronic kidney disease, or unspecified chronic kidney disease: Secondary | ICD-10-CM | POA: Diagnosis not present

## 2021-08-05 DIAGNOSIS — I5033 Acute on chronic diastolic (congestive) heart failure: Secondary | ICD-10-CM | POA: Diagnosis not present

## 2021-08-05 DIAGNOSIS — E1122 Type 2 diabetes mellitus with diabetic chronic kidney disease: Secondary | ICD-10-CM | POA: Diagnosis not present

## 2021-08-06 ENCOUNTER — Telehealth: Payer: Self-pay | Admitting: Cardiology

## 2021-08-06 DIAGNOSIS — I8500 Esophageal varices without bleeding: Secondary | ICD-10-CM | POA: Diagnosis not present

## 2021-08-06 DIAGNOSIS — E785 Hyperlipidemia, unspecified: Secondary | ICD-10-CM | POA: Diagnosis not present

## 2021-08-06 DIAGNOSIS — K7291 Hepatic failure, unspecified with coma: Secondary | ICD-10-CM | POA: Diagnosis not present

## 2021-08-06 DIAGNOSIS — E1122 Type 2 diabetes mellitus with diabetic chronic kidney disease: Secondary | ICD-10-CM | POA: Diagnosis not present

## 2021-08-06 DIAGNOSIS — I5033 Acute on chronic diastolic (congestive) heart failure: Secondary | ICD-10-CM | POA: Diagnosis not present

## 2021-08-06 DIAGNOSIS — Z951 Presence of aortocoronary bypass graft: Secondary | ICD-10-CM | POA: Diagnosis not present

## 2021-08-06 DIAGNOSIS — E876 Hypokalemia: Secondary | ICD-10-CM | POA: Diagnosis not present

## 2021-08-06 DIAGNOSIS — I130 Hypertensive heart and chronic kidney disease with heart failure and stage 1 through stage 4 chronic kidney disease, or unspecified chronic kidney disease: Secondary | ICD-10-CM | POA: Diagnosis not present

## 2021-08-06 DIAGNOSIS — D62 Acute posthemorrhagic anemia: Secondary | ICD-10-CM | POA: Diagnosis not present

## 2021-08-06 DIAGNOSIS — Z794 Long term (current) use of insulin: Secondary | ICD-10-CM | POA: Diagnosis not present

## 2021-08-06 DIAGNOSIS — N1832 Chronic kidney disease, stage 3b: Secondary | ICD-10-CM | POA: Diagnosis not present

## 2021-08-06 DIAGNOSIS — Z79899 Other long term (current) drug therapy: Secondary | ICD-10-CM

## 2021-08-06 DIAGNOSIS — I2510 Atherosclerotic heart disease of native coronary artery without angina pectoris: Secondary | ICD-10-CM | POA: Diagnosis not present

## 2021-08-06 DIAGNOSIS — Z7982 Long term (current) use of aspirin: Secondary | ICD-10-CM | POA: Diagnosis not present

## 2021-08-06 DIAGNOSIS — D631 Anemia in chronic kidney disease: Secondary | ICD-10-CM | POA: Diagnosis not present

## 2021-08-06 DIAGNOSIS — K7460 Unspecified cirrhosis of liver: Secondary | ICD-10-CM | POA: Diagnosis not present

## 2021-08-06 DIAGNOSIS — E2740 Unspecified adrenocortical insufficiency: Secondary | ICD-10-CM | POA: Diagnosis not present

## 2021-08-06 MED ORDER — TORSEMIDE 20 MG PO TABS: 20.0000 mg | ORAL_TABLET | Freq: Every day | ORAL | 3 refills | Status: DC

## 2021-08-06 MED FILL — Iron Sucrose Inj 20 MG/ML (Fe Equiv): INTRAVENOUS | Qty: 20 | Status: AC

## 2021-08-06 NOTE — Telephone Encounter (Signed)
I spoke with patient and he confirms that he is taking Torsemide 40 mg daily since 07/27/21 after our phone conversation. He states his weight never went down so he stayed at the 40 mg dose. He confirms he is taking potassium 20 meq daily.  It appears a Dr.Stephen Wyline Copas stopped torsemide on 07/25/21- states "stop taking on discharge", even though patient never stopped taking.

## 2021-08-06 NOTE — Telephone Encounter (Signed)
furosemide (LASIX)  Patient said he saw Dr Domenic Polite about 2 weeks ago. He increased his Furosemide from 1 pill > 2pills. He had top go last week to [facility] ED to IV lasixs to remove 15lbs of fluid. He is still swollen and normally weighs around 265.[name] He can be reached [phone]  Weight: 8/16 290 8/17 285 8/18 284 8/19 286 8/20 280 8/21 278 8/22 275 8/23 278 8/24 279 8/25 280

## 2021-08-06 NOTE — Telephone Encounter (Signed)
Patient called again , he has not heard anything from Dr Domenic Polite  , its getting late and he would like this address today if possible ?

## 2021-08-06 NOTE — Telephone Encounter (Signed)
Spoke with pt who states that he was seen in the [facility] ED on last week. Pt c/o swelling to legs and abd. Pt denies SOB at this time. States that he never got back to baseline weight. Please advise.

## 2021-08-06 NOTE — Telephone Encounter (Signed)
Spoke with pt and informed him that Dr. Domenic Polite has left for the day. Informed pt that we will call him as soon as Dr. Domenic Polite is able to respond.

## 2021-08-07 ENCOUNTER — Inpatient Hospital Stay (HOSPITAL_COMMUNITY): Payer: Federal, State, Local not specified - PPO

## 2021-08-07 MED ORDER — TORSEMIDE 20 MG PO TABS: 40.0000 mg | ORAL_TABLET | Freq: Two times a day (BID) | ORAL | 3 refills | Status: DC

## 2021-08-07 MED ORDER — POTASSIUM CHLORIDE CRYS ER 20 MEQ PO TBCR: 20.0000 meq | EXTENDED_RELEASE_TABLET | Freq: Two times a day (BID) | ORAL | 3 refills | Status: DC

## 2021-08-07 NOTE — Telephone Encounter (Signed)
I spoke with patient.He will increase Torsemide to 40 mg bid and potassium 20 meq bid. BMET next week (9/2)   Follow up apt made for 09/11/21 with B.Strader, PA-C at 3:30 pm.    [name] will call on Monday 8/29 with update.

## 2021-08-10 ENCOUNTER — Other Ambulatory Visit: Payer: Self-pay | Admitting: Adult Health

## 2021-08-10 ENCOUNTER — Telehealth: Payer: Self-pay | Admitting: Cardiology

## 2021-08-10 DIAGNOSIS — R5381 Other malaise: Secondary | ICD-10-CM | POA: Diagnosis not present

## 2021-08-10 DIAGNOSIS — R531 Weakness: Secondary | ICD-10-CM | POA: Diagnosis not present

## 2021-08-10 NOTE — Telephone Encounter (Signed)
Noted. Will forward to provider as Juluis Rainier

## 2021-08-10 NOTE — Telephone Encounter (Signed)
Patient called stating that he is down 231 lbs.

## 2021-08-11 ENCOUNTER — Other Ambulatory Visit: Payer: Self-pay

## 2021-08-11 ENCOUNTER — Inpatient Hospital Stay (HOSPITAL_COMMUNITY)
Admission: EM | Admit: 2021-08-11 | Discharge: 2021-08-25 | DRG: 641 | Disposition: A | Discharge status: Skilled Nursing Facility | Payer: Federal, State, Local not specified - PPO | Attending: Family Medicine | Admitting: Family Medicine

## 2021-08-11 ENCOUNTER — Encounter (HOSPITAL_COMMUNITY): Payer: Self-pay | Admitting: Emergency Medicine

## 2021-08-11 ENCOUNTER — Emergency Department (HOSPITAL_COMMUNITY): Payer: Federal, State, Local not specified - PPO

## 2021-08-11 ENCOUNTER — Encounter: Payer: Self-pay | Admitting: Internal Medicine

## 2021-08-11 ENCOUNTER — Ambulatory Visit: Payer: Federal, State, Local not specified - PPO | Admitting: Internal Medicine

## 2021-08-11 DIAGNOSIS — I10 Essential (primary) hypertension: Secondary | ICD-10-CM | POA: Diagnosis present

## 2021-08-11 DIAGNOSIS — W1809XA Striking against other object with subsequent fall, initial encounter: Secondary | ICD-10-CM | POA: Diagnosis present

## 2021-08-11 DIAGNOSIS — Z86010 Personal history of colonic polyps: Secondary | ICD-10-CM

## 2021-08-11 DIAGNOSIS — J320 Chronic maxillary sinusitis: Secondary | ICD-10-CM | POA: Diagnosis not present

## 2021-08-11 DIAGNOSIS — D61818 Other pancytopenia: Secondary | ICD-10-CM | POA: Diagnosis present

## 2021-08-11 DIAGNOSIS — J811 Chronic pulmonary edema: Secondary | ICD-10-CM | POA: Diagnosis not present

## 2021-08-11 DIAGNOSIS — E876 Hypokalemia: Secondary | ICD-10-CM | POA: Diagnosis present

## 2021-08-11 DIAGNOSIS — N1832 Chronic kidney disease, stage 3b: Secondary | ICD-10-CM | POA: Diagnosis present

## 2021-08-11 DIAGNOSIS — Z96652 Presence of left artificial knee joint: Secondary | ICD-10-CM | POA: Diagnosis present

## 2021-08-11 DIAGNOSIS — S0990XA Unspecified injury of head, initial encounter: Secondary | ICD-10-CM | POA: Diagnosis not present

## 2021-08-11 DIAGNOSIS — I9589 Other hypotension: Secondary | ICD-10-CM | POA: Diagnosis present

## 2021-08-11 DIAGNOSIS — M549 Dorsalgia, unspecified: Secondary | ICD-10-CM | POA: Diagnosis present

## 2021-08-11 DIAGNOSIS — I8500 Esophageal varices without bleeding: Secondary | ICD-10-CM | POA: Diagnosis present

## 2021-08-11 DIAGNOSIS — I130 Hypertensive heart and chronic kidney disease with heart failure and stage 1 through stage 4 chronic kidney disease, or unspecified chronic kidney disease: Secondary | ICD-10-CM | POA: Diagnosis present

## 2021-08-11 DIAGNOSIS — E1169 Type 2 diabetes mellitus with other specified complication: Secondary | ICD-10-CM | POA: Diagnosis present

## 2021-08-11 DIAGNOSIS — R296 Repeated falls: Secondary | ICD-10-CM | POA: Diagnosis present

## 2021-08-11 DIAGNOSIS — W19XXXA Unspecified fall, initial encounter: Secondary | ICD-10-CM

## 2021-08-11 DIAGNOSIS — G4733 Obstructive sleep apnea (adult) (pediatric): Secondary | ICD-10-CM | POA: Diagnosis present

## 2021-08-11 DIAGNOSIS — D509 Iron deficiency anemia, unspecified: Secondary | ICD-10-CM | POA: Diagnosis present

## 2021-08-11 DIAGNOSIS — I959 Hypotension, unspecified: Secondary | ICD-10-CM | POA: Diagnosis present

## 2021-08-11 DIAGNOSIS — Z8249 Family history of ischemic heart disease and other diseases of the circulatory system: Secondary | ICD-10-CM

## 2021-08-11 DIAGNOSIS — E860 Dehydration: Secondary | ICD-10-CM | POA: Diagnosis present

## 2021-08-11 DIAGNOSIS — I252 Old myocardial infarction: Secondary | ICD-10-CM

## 2021-08-11 DIAGNOSIS — N179 Acute kidney failure, unspecified: Secondary | ICD-10-CM | POA: Diagnosis present

## 2021-08-11 DIAGNOSIS — I5032 Chronic diastolic (congestive) heart failure: Secondary | ICD-10-CM | POA: Diagnosis present

## 2021-08-11 DIAGNOSIS — Z888 Allergy status to other drugs, medicaments and biological substances status: Secondary | ICD-10-CM

## 2021-08-11 DIAGNOSIS — D649 Anemia, unspecified: Secondary | ICD-10-CM | POA: Diagnosis present

## 2021-08-11 DIAGNOSIS — E1122 Type 2 diabetes mellitus with diabetic chronic kidney disease: Secondary | ICD-10-CM | POA: Diagnosis present

## 2021-08-11 DIAGNOSIS — M546 Pain in thoracic spine: Secondary | ICD-10-CM | POA: Diagnosis not present

## 2021-08-11 DIAGNOSIS — I5033 Acute on chronic diastolic (congestive) heart failure: Secondary | ICD-10-CM | POA: Diagnosis present

## 2021-08-11 DIAGNOSIS — Z807 Family history of other malignant neoplasms of lymphoid, hematopoietic and related tissues: Secondary | ICD-10-CM

## 2021-08-11 DIAGNOSIS — K7460 Unspecified cirrhosis of liver: Secondary | ICD-10-CM | POA: Diagnosis present

## 2021-08-11 DIAGNOSIS — M25569 Pain in unspecified knee: Secondary | ICD-10-CM

## 2021-08-11 DIAGNOSIS — R7989 Other specified abnormal findings of blood chemistry: Secondary | ICD-10-CM | POA: Diagnosis present

## 2021-08-11 DIAGNOSIS — Z951 Presence of aortocoronary bypass graft: Secondary | ICD-10-CM

## 2021-08-11 DIAGNOSIS — Z9989 Dependence on other enabling machines and devices: Secondary | ICD-10-CM

## 2021-08-11 DIAGNOSIS — E785 Hyperlipidemia, unspecified: Secondary | ICD-10-CM | POA: Diagnosis present

## 2021-08-11 DIAGNOSIS — E669 Obesity, unspecified: Secondary | ICD-10-CM | POA: Diagnosis present

## 2021-08-11 DIAGNOSIS — E871 Hypo-osmolality and hyponatremia: Secondary | ICD-10-CM | POA: Diagnosis not present

## 2021-08-11 DIAGNOSIS — Z794 Long term (current) use of insulin: Secondary | ICD-10-CM

## 2021-08-11 DIAGNOSIS — R262 Difficulty in walking, not elsewhere classified: Secondary | ICD-10-CM | POA: Diagnosis not present

## 2021-08-11 DIAGNOSIS — K7581 Nonalcoholic steatohepatitis (NASH): Secondary | ICD-10-CM | POA: Diagnosis present

## 2021-08-11 DIAGNOSIS — K7290 Hepatic failure, unspecified without coma: Secondary | ICD-10-CM | POA: Diagnosis present

## 2021-08-11 DIAGNOSIS — Z800 Family history of malignant neoplasm of digestive organs: Secondary | ICD-10-CM

## 2021-08-11 DIAGNOSIS — Y92009 Unspecified place in unspecified non-institutional (private) residence as the place of occurrence of the external cause: Secondary | ICD-10-CM

## 2021-08-11 DIAGNOSIS — M62838 Other muscle spasm: Secondary | ICD-10-CM | POA: Diagnosis present

## 2021-08-11 DIAGNOSIS — Z20822 Contact with and (suspected) exposure to covid-19: Secondary | ICD-10-CM | POA: Diagnosis present

## 2021-08-11 DIAGNOSIS — Y9301 Activity, walking, marching and hiking: Secondary | ICD-10-CM | POA: Diagnosis present

## 2021-08-11 DIAGNOSIS — Z6838 Body mass index (BMI) 38.0-38.9, adult: Secondary | ICD-10-CM

## 2021-08-11 DIAGNOSIS — Z833 Family history of diabetes mellitus: Secondary | ICD-10-CM

## 2021-08-11 DIAGNOSIS — M545 Low back pain, unspecified: Secondary | ICD-10-CM | POA: Diagnosis not present

## 2021-08-11 DIAGNOSIS — D693 Immune thrombocytopenic purpura: Secondary | ICD-10-CM | POA: Diagnosis present

## 2021-08-11 DIAGNOSIS — E1165 Type 2 diabetes mellitus with hyperglycemia: Secondary | ICD-10-CM | POA: Diagnosis present

## 2021-08-11 DIAGNOSIS — I2510 Atherosclerotic heart disease of native coronary artery without angina pectoris: Secondary | ICD-10-CM | POA: Diagnosis present

## 2021-08-11 DIAGNOSIS — Z7982 Long term (current) use of aspirin: Secondary | ICD-10-CM

## 2021-08-11 DIAGNOSIS — S199XXA Unspecified injury of neck, initial encounter: Secondary | ICD-10-CM | POA: Diagnosis not present

## 2021-08-11 DIAGNOSIS — E861 Hypovolemia: Secondary | ICD-10-CM | POA: Diagnosis present

## 2021-08-11 DIAGNOSIS — Z79899 Other long term (current) drug therapy: Secondary | ICD-10-CM

## 2021-08-11 LAB — BLOOD GAS, ARTERIAL
Acid-Base Excess: 11.1 mmol/L — ABNORMAL HIGH (ref 0.0–2.0)
Bicarbonate: 34.6 mmol/L — ABNORMAL HIGH (ref 20.0–28.0)
FIO2: 21
O2 Saturation: 95.9 %
Patient temperature: 36.8
pCO2 arterial: 44.9 mmHg (ref 32.0–48.0)
pH, Arterial: 7.503 — ABNORMAL HIGH (ref 7.350–7.450)
pO2, Arterial: 74.5 mmHg — ABNORMAL LOW (ref 83.0–108.0)

## 2021-08-11 LAB — URINALYSIS, ROUTINE W REFLEX MICROSCOPIC
Bilirubin Urine: NEGATIVE
Glucose, UA: NEGATIVE mg/dL
Hgb urine dipstick: NEGATIVE
Ketones, ur: NEGATIVE mg/dL
Leukocytes,Ua: NEGATIVE
Nitrite: NEGATIVE
Protein, ur: NEGATIVE mg/dL
Specific Gravity, Urine: 1.009 (ref 1.005–1.030)
pH: 7 (ref 5.0–8.0)

## 2021-08-11 LAB — GLUCOSE, CAPILLARY
Glucose-Capillary: 243 mg/dL — ABNORMAL HIGH (ref 70–99)
Glucose-Capillary: 341 mg/dL — ABNORMAL HIGH (ref 70–99)

## 2021-08-11 LAB — CBC WITH DIFFERENTIAL/PLATELET
Abs Immature Granulocytes: 0.03 10*3/uL (ref 0.00–0.07)
Basophils Absolute: 0 10*3/uL (ref 0.0–0.1)
Basophils Relative: 0 %
Eosinophils Absolute: 0.1 10*3/uL (ref 0.0–0.5)
Eosinophils Relative: 2 %
HCT: 29.9 % — ABNORMAL LOW (ref 39.0–52.0)
Hemoglobin: 9.3 g/dL — ABNORMAL LOW (ref 13.0–17.0)
Immature Granulocytes: 1 %
Lymphocytes Relative: 17 %
Lymphs Abs: 1 10*3/uL (ref 0.7–4.0)
MCH: 29.2 pg (ref 26.0–34.0)
MCHC: 31.1 g/dL (ref 30.0–36.0)
MCV: 94 fL (ref 80.0–100.0)
Monocytes Absolute: 0.8 10*3/uL (ref 0.1–1.0)
Monocytes Relative: 14 %
Neutro Abs: 3.8 10*3/uL (ref 1.7–7.7)
Neutrophils Relative %: 66 %
Platelets: 88 10*3/uL — ABNORMAL LOW (ref 150–400)
RBC: 3.18 MIL/uL — ABNORMAL LOW (ref 4.22–5.81)
RDW: 20 % — ABNORMAL HIGH (ref 11.5–15.5)
WBC: 5.7 10*3/uL (ref 4.0–10.5)
nRBC: 0 % (ref 0.0–0.2)

## 2021-08-11 LAB — COMPREHENSIVE METABOLIC PANEL
ALT: 15 U/L (ref 0–44)
AST: 38 U/L (ref 15–41)
Albumin: 3 g/dL — ABNORMAL LOW (ref 3.5–5.0)
Alkaline Phosphatase: 66 U/L (ref 38–126)
Anion gap: 12 (ref 5–15)
BUN: 31 mg/dL — ABNORMAL HIGH (ref 8–23)
CO2: 33 mmol/L — ABNORMAL HIGH (ref 22–32)
Calcium: 8.4 mg/dL — ABNORMAL LOW (ref 8.9–10.3)
Chloride: 86 mmol/L — ABNORMAL LOW (ref 98–111)
Creatinine, Ser: 1.53 mg/dL — ABNORMAL HIGH (ref 0.61–1.24)
GFR, Estimated: 51 mL/min — ABNORMAL LOW (ref >60)
Glucose, Bld: 165 mg/dL — ABNORMAL HIGH (ref 70–99)
Potassium: 2.4 mmol/L — CL (ref 3.5–5.1)
Sodium: 131 mmol/L — ABNORMAL LOW (ref 135–145)
Total Bilirubin: 1.9 mg/dL — ABNORMAL HIGH (ref 0.3–1.2)
Total Protein: 6.8 g/dL (ref 6.5–8.1)

## 2021-08-11 LAB — CBG MONITORING, ED: Glucose-Capillary: 163 mg/dL — ABNORMAL HIGH (ref 70–99)

## 2021-08-11 LAB — AMMONIA: Ammonia: 42 umol/L — ABNORMAL HIGH (ref 9–35)

## 2021-08-11 LAB — RESP PANEL BY RT-PCR (FLU A&B, COVID) ARPGX2
Influenza A by PCR: NEGATIVE
Influenza B by PCR: NEGATIVE
SARS Coronavirus 2 by RT PCR: NEGATIVE

## 2021-08-11 MED ORDER — POTASSIUM CHLORIDE CRYS ER 20 MEQ PO TBCR
40.0000 meq | EXTENDED_RELEASE_TABLET | Freq: Two times a day (BID) | ORAL | Status: DC
  Administered 2021-08-11 – 2021-08-13 (×5): 40 meq via ORAL
  Filled 2021-08-11 (×5): qty 2

## 2021-08-11 MED ORDER — ACETAMINOPHEN 325 MG PO TABS
650.0000 mg | ORAL_TABLET | Freq: Four times a day (QID) | ORAL | Status: DC | PRN
  Administered 2021-08-12 – 2021-08-23 (×9): 650 mg via ORAL
  Filled 2021-08-11 (×8): qty 2

## 2021-08-11 MED ORDER — MAGNESIUM OXIDE -MG SUPPLEMENT 400 (240 MG) MG PO TABS
400.0000 mg | ORAL_TABLET | Freq: Every day | ORAL | Status: DC
  Administered 2021-08-11 – 2021-08-25 (×15): 400 mg via ORAL
  Filled 2021-08-11 (×14): qty 1

## 2021-08-11 MED ORDER — POTASSIUM CHLORIDE CRYS ER 20 MEQ PO TBCR
20.0000 meq | EXTENDED_RELEASE_TABLET | Freq: Two times a day (BID) | ORAL | Status: DC
  Administered 2021-08-11 – 2021-08-25 (×29): 20 meq via ORAL
  Filled 2021-08-11 (×8): qty 1
  Filled 2021-08-11: qty 2
  Filled 2021-08-11 (×19): qty 1

## 2021-08-11 MED ORDER — ASPIRIN EC 81 MG PO TBEC
81.0000 mg | DELAYED_RELEASE_TABLET | Freq: Every day | ORAL | Status: DC
  Administered 2021-08-11 – 2021-08-24 (×14): 81 mg via ORAL
  Filled 2021-08-11 (×14): qty 1

## 2021-08-11 MED ORDER — INSULIN ASPART 100 UNIT/ML IJ SOLN
0.0000 [IU] | Freq: Every day | INTRAMUSCULAR | Status: DC
  Administered 2021-08-11: 4 [IU] via SUBCUTANEOUS
  Administered 2021-08-12: 3 [IU] via SUBCUTANEOUS
  Administered 2021-08-13: 4 [IU] via SUBCUTANEOUS
  Administered 2021-08-14 – 2021-08-20 (×3): 3 [IU] via SUBCUTANEOUS

## 2021-08-11 MED ORDER — ONDANSETRON HCL 4 MG/2ML IJ SOLN: 4.0000 mg | Freq: Four times a day (QID) | INTRAMUSCULAR | Status: DC | PRN

## 2021-08-11 MED ORDER — LACTULOSE ENEMA
300.0000 mL | Freq: Once | ORAL | Status: AC
  Administered 2021-08-11: 300 mL via RECTAL
  Filled 2021-08-11: qty 300

## 2021-08-11 MED ORDER — TORSEMIDE 20 MG PO TABS: 40.0000 mg | ORAL_TABLET | Freq: Every day | ORAL | 3 refills | Status: DC

## 2021-08-11 MED ORDER — MIDODRINE HCL 5 MG PO TABS
5.0000 mg | ORAL_TABLET | Freq: Three times a day (TID) | ORAL | Status: DC
  Administered 2021-08-11 – 2021-08-13 (×6): 5 mg via ORAL
  Filled 2021-08-11 (×6): qty 1

## 2021-08-11 MED ORDER — NADOLOL 20 MG PO TABS
20.0000 mg | ORAL_TABLET | Freq: Every day | ORAL | Status: DC
  Administered 2021-08-12 – 2021-08-25 (×14): 20 mg via ORAL
  Filled 2021-08-11 (×18): qty 1

## 2021-08-11 MED ORDER — SODIUM CHLORIDE 0.9 % IV BOLUS
1000.0000 mL | Freq: Once | INTRAVENOUS | Status: AC
  Administered 2021-08-11: 1000 mL via INTRAVENOUS

## 2021-08-11 MED ORDER — INSULIN ASPART 100 UNIT/ML ~~LOC~~ SOLN
5.0000 [IU] | Freq: Three times a day (TID) | SUBCUTANEOUS | Status: DC
  Administered 2021-08-11 – 2021-08-12 (×5): 5 [IU] via SUBCUTANEOUS
  Filled 2021-08-11: qty 1
  Filled 2021-08-11: qty 0.05

## 2021-08-11 MED ORDER — POTASSIUM CHLORIDE 10 MEQ/100ML IV SOLN
10.0000 meq | Freq: Once | INTRAVENOUS | Status: AC
  Administered 2021-08-11: 10 meq via INTRAVENOUS
  Filled 2021-08-11: qty 100

## 2021-08-11 MED ORDER — POTASSIUM CHLORIDE CRYS ER 20 MEQ PO TBCR: 20.0000 meq | EXTENDED_RELEASE_TABLET | Freq: Every day | ORAL | 3 refills | Status: DC

## 2021-08-11 MED ORDER — INSULIN GLARGINE-YFGN 100 UNIT/ML ~~LOC~~ SOLN
30.0000 [IU] | Freq: Every day | SUBCUTANEOUS | Status: DC
  Administered 2021-08-11 – 2021-08-24 (×14): 30 [IU] via SUBCUTANEOUS
  Filled 2021-08-11 (×15): qty 0.3

## 2021-08-11 MED ORDER — ACETAMINOPHEN 325 MG PO TABS: 650.0000 mg | ORAL_TABLET | Freq: Four times a day (QID) | ORAL | Status: DC | PRN

## 2021-08-11 MED ORDER — VENLAFAXINE HCL ER 37.5 MG PO CP24
37.5000 mg | ORAL_CAPSULE | Freq: Every day | ORAL | Status: DC
  Administered 2021-08-11 – 2021-08-25 (×15): 37.5 mg via ORAL
  Filled 2021-08-11 (×18): qty 1

## 2021-08-11 MED ORDER — INSULIN GLARGINE-YFGN 100 UNIT/ML ~~LOC~~ SOLN
40.0000 [IU] | Freq: Every day | SUBCUTANEOUS | Status: DC
  Administered 2021-08-11 – 2021-08-25 (×13): 40 [IU] via SUBCUTANEOUS
  Filled 2021-08-11 (×16): qty 0.4

## 2021-08-11 MED ORDER — INSULIN DEGLUDEC 200 UNIT/ML ~~LOC~~ SOPN: PEN_INJECTOR | Freq: Every morning | SUBCUTANEOUS | Status: DC

## 2021-08-11 MED ORDER — PRAVASTATIN SODIUM 10 MG PO TABS
20.0000 mg | ORAL_TABLET | Freq: Every day | ORAL | Status: DC
  Administered 2021-08-11 – 2021-08-24 (×14): 20 mg via ORAL
  Filled 2021-08-11 (×15): qty 2

## 2021-08-11 MED ORDER — ONDANSETRON HCL 4 MG PO TABS: 4.0000 mg | ORAL_TABLET | Freq: Four times a day (QID) | ORAL | Status: DC | PRN

## 2021-08-11 MED ORDER — INSULIN ASPART 100 UNIT/ML IJ SOLN
0.0000 [IU] | Freq: Three times a day (TID) | INTRAMUSCULAR | Status: DC
  Administered 2021-08-11 – 2021-08-12 (×2): 5 [IU] via SUBCUTANEOUS
  Administered 2021-08-12: 8 [IU] via SUBCUTANEOUS
  Administered 2021-08-12: 5 [IU] via SUBCUTANEOUS
  Administered 2021-08-13 (×3): 3 [IU] via SUBCUTANEOUS
  Administered 2021-08-14: 8 [IU] via SUBCUTANEOUS
  Administered 2021-08-14: 5 [IU] via SUBCUTANEOUS
  Administered 2021-08-14: 11 [IU] via SUBCUTANEOUS
  Administered 2021-08-15: 5 [IU] via SUBCUTANEOUS
  Administered 2021-08-15: 3 [IU] via SUBCUTANEOUS
  Administered 2021-08-15: 2 [IU] via SUBCUTANEOUS
  Administered 2021-08-16 (×2): 3 [IU] via SUBCUTANEOUS
  Administered 2021-08-17: 5 [IU] via SUBCUTANEOUS
  Administered 2021-08-17 – 2021-08-18 (×2): 3 [IU] via SUBCUTANEOUS
  Administered 2021-08-18: 5 [IU] via SUBCUTANEOUS
  Administered 2021-08-18: 2 [IU] via SUBCUTANEOUS
  Administered 2021-08-19: 3 [IU] via SUBCUTANEOUS
  Administered 2021-08-19: 2 [IU] via SUBCUTANEOUS
  Administered 2021-08-20 (×2): 5 [IU] via SUBCUTANEOUS
  Administered 2021-08-21: 2 [IU] via SUBCUTANEOUS

## 2021-08-11 MED ORDER — FERROUS SULFATE 325 (65 FE) MG PO TABS
325.0000 mg | ORAL_TABLET | Freq: Every morning | ORAL | Status: DC
  Administered 2021-08-12 – 2021-08-25 (×14): 325 mg via ORAL
  Filled 2021-08-11 (×14): qty 1

## 2021-08-11 MED ORDER — POTASSIUM CHLORIDE IN NACL 40-0.9 MEQ/L-% IV SOLN
INTRAVENOUS | Status: AC
  Filled 2021-08-11: qty 1000

## 2021-08-11 MED ORDER — LACTULOSE 10 GM/15ML PO SOLN
20.0000 g | Freq: Every day | ORAL | Status: DC
  Administered 2021-08-12 – 2021-08-25 (×14): 20 g via ORAL
  Filled 2021-08-11 (×14): qty 30

## 2021-08-11 MED ORDER — ACETAMINOPHEN 650 MG RE SUPP: 650.0000 mg | Freq: Four times a day (QID) | RECTAL | Status: DC | PRN

## 2021-08-11 MED ORDER — PANTOPRAZOLE SODIUM 40 MG PO TBEC
40.0000 mg | DELAYED_RELEASE_TABLET | Freq: Two times a day (BID) | ORAL | Status: DC
  Administered 2021-08-11 – 2021-08-25 (×28): 40 mg via ORAL
  Filled 2021-08-11 (×28): qty 1

## 2021-08-11 NOTE — ED Notes (Signed)
Pt calling wife so she can come back to room.

## 2021-08-11 NOTE — ED Notes (Signed)
Pt has urinal for sample

## 2021-08-11 NOTE — Progress Notes (Signed)
Pt placed on CPAP for hs , machine plugged in red outlet, pt tolerating well. Vitals within normal limits.

## 2021-08-11 NOTE — ED Notes (Signed)
Pt says that he was walking to with his walker when he tripped over a rug. Pt denied cause of fall from weakness or loc. Pt states that he is having back pain that wont resolve. Said that he mainly fell on his right side.

## 2021-08-11 NOTE — ED Notes (Addendum)
Pt asleep ; easy to arouse

## 2021-08-11 NOTE — Evaluation (Signed)
Physical Therapy Evaluation Patient Details Name: [name] MRN: [id number] DOB: July 06, 1961 Today's Date: 08/11/2021   History of Present Illness  [name] is a 60 y.o. male with medical history significant for CAD with prior CABG, cirrhosis, hypertension, dyslipidemia, OSA on CPAP, thrombocytopenia, and type 2 diabetes who presented to the ED after falling at home.  He states that he was using his walker when he tripped over a rug.  He is unsure of losing consciousness after hitting his head, but he did have difficulty getting off the floor due to low back pain.  He denies any other recent events and states that he lives with his wife at home and takes his medications as prescribed.  Wife states that he has been getting more deconditioned lately and has been falling more frequently.   Clinical Impression  Patient demonstrates slow labored movement for sitting up at bedside requiring Mod assist and frequent rest breaks, limited to a few side steps at bedside due to generalized weakness and fair/poor standing balance using RW.  Patient tolerated sitting up in chair after therapy.  Patient states his spouse will be able assist him at home.  Patient will benefit from continued physical therapy in hospital and recommended venue below to increase strength, balance, endurance for safe ADLs and gait.       Follow Up Recommendations Home health PT;Supervision for mobility/OOB;Supervision - Intermittent    Equipment Recommendations  None recommended by PT    Recommendations for Other Services       Precautions / Restrictions Precautions Precautions: Fall Restrictions Weight Bearing Restrictions: No      Mobility  Bed Mobility Overal bed mobility: Needs Assistance Bed Mobility: Supine to Sit     Supine to sit: Mod assist     General bed mobility comments: increased time, labored movement    Transfers Overall transfer level: Needs assistance Equipment used:  Rolling walker (2 wheeled) Transfers: Sit to/from [facility] Sit to Stand: Min assist Stand pivot transfers: Min assist;Mod assist       General transfer comment: slow labored movement  Ambulation/Gait Ambulation/Gait assistance: Mod assist Gait Distance (Feet): 6 Feet Assistive device: Rolling walker (2 wheeled) Gait Pattern/deviations: Decreased step length - right;Decreased step length - left;Decreased stance time - left Gait velocity: decreased   General Gait Details: limited to a few slow labored side steps due to fatigue and fair/poor standing balance using RW  Stairs            Wheelchair Mobility    Modified Rankin (Stroke Patients Only)       Balance Overall balance assessment: Needs assistance Sitting-balance support: Feet supported;No upper extremity supported Sitting balance-Leahy Scale: Fair Sitting balance - Comments: fair/good seated at EOB   Standing balance support: Bilateral upper extremity supported Standing balance-Leahy Scale: Poor Standing balance comment: fair/poor using RW                             Pertinent Vitals/Pain Pain Assessment: 0-10 Pain Score: 6  Pain Location: low back Pain Descriptors / Indicators: Sore Pain Intervention(s): Limited activity within patient's tolerance;Monitored during session;Repositioned    Home Living Family/patient expects to be discharged to:: Private residence Living Arrangements: Spouse/significant other Available Help at Discharge: Family;Available 24 hours/day Type of Home: House Home Access: Stairs to enter Entrance Stairs-Rails: Left Entrance Stairs-Number of Steps: 3 Home Layout: One level Home Equipment: Walker - 2 wheels;Cane - single point;Cane -  quad      Prior Function Level of Independence: Independent with assistive device(s)         Comments: Hydrographic surveyor using RW PRN     Hand Dominance   Dominant Hand: Right    Extremity/Trunk  Assessment   Upper Extremity Assessment Upper Extremity Assessment: Generalized weakness    Lower Extremity Assessment Lower Extremity Assessment: Generalized weakness    Cervical / Trunk Assessment Cervical / Trunk Assessment: Normal  Communication   Communication: No difficulties  Cognition Arousal/Alertness: Awake/alert Behavior During Therapy: WFL for tasks assessed/performed Overall Cognitive Status: Within Functional Limits for tasks assessed                                        General Comments      Exercises     Assessment/Plan    PT Assessment Patient needs continued PT services  PT Problem List Decreased strength;Decreased activity tolerance;Decreased balance;Decreased mobility       PT Treatment Interventions DME instruction;Therapeutic exercise;Gait training;Balance training;Stair training;Neuromuscular re-education;Functional mobility training;Patient/family education;Therapeutic activities    PT Goals (Current goals can be found in the Care Plan section)  Acute Rehab PT Goals Patient Stated Goal: Return home PT Goal Formulation: With patient Time For Goal Achievement: 08/14/21 Potential to Achieve Goals: Good    Frequency Min 3X/week   Barriers to discharge        Co-evaluation               AM-PAC PT "6 Clicks" Mobility  Outcome Measure Help needed turning from your back to your side while in a flat bed without using bedrails?: A Little Help needed moving from lying on your back to sitting on the side of a flat bed without using bedrails?: A Lot Help needed moving to and from a bed to a chair (including a wheelchair)?: A Lot Help needed standing up from a chair using your arms (e.g., wheelchair or bedside chair)?: A Little Help needed to walk in hospital room?: A Lot Help needed climbing 3-5 steps with a railing? : A Lot 6 Click Score: 14    End of Session   Activity Tolerance: Patient tolerated treatment well;Patient  limited by fatigue Patient left: in chair;with call bell/phone within reach Nurse Communication: Mobility status PT Visit Diagnosis: Unsteadiness on feet (R26.81);Other abnormalities of gait and mobility (R26.89);Muscle weakness (generalized) (M62.81)    Time: 1779-3903 PT Time Calculation (min) (ACUTE ONLY): 28 min   Charges:   PT Evaluation $PT Eval Moderate Complexity: 1 Mod PT Treatments $Therapeutic Activity: 23-37 mins        3:49 PM, 08/11/21 Lonell Grandchild, MPT Physical Therapist with [facility] [phone] office [phone] mobile phone

## 2021-08-11 NOTE — ED Provider Notes (Signed)
[name] EMERGENCY DEPARTMENT Provider Note   CSN: [id number] Arrival date & time: 08/11/21  0402     History Chief Complaint  Patient presents with   [name] is a 60 y.o. male.  The history is provided by the patient and the spouse.  Fall This is a new problem. The problem has not changed since onset.Associated symptoms include headaches. Pertinent negatives include no chest pain, no abdominal pain and no shortness of breath. The symptoms are aggravated by walking. The symptoms are relieved by rest.  Patient with extensive history including cirrhosis, chronic kidney disease, CAD presents after fall.  Patient reports he has  fallen at least once or twice over the past 2 weeks.  This most recent fall occurred when he was using a walker and he tripped over a rug.  He thinks he hit his head but is unsure if he had loss of consciousness.  He reports he had difficulty getting off the floor and low back pain    Past Medical History:  Diagnosis Date   Anemia    Arthritis    CAD (coronary artery disease)    Multivessel disease status post CABG 08/2015   Cirrhosis ([place])    CKD (chronic kidney disease) stage 3, GFR 30-59 ml/min (HCC)    Diastolic CHF ([place])    Essential hypertension    Hyperlipidemia    Iron deficiency anemia 07/15/2021   OSA on CPAP    Polyclonal gammopathy    Thrombocytopenia (HCC) 2016   Type 2 diabetes mellitus ([place])     Patient Active Problem List   Diagnosis Date Noted   Hypokalemia 51/88/4166   Acute metabolic encephalopathy 06/11/1600   Chronic kidney disease 07/21/2021   Pure hypercholesterolemia 07/21/2021   GI bleed 07/21/2021   Iron deficiency anemia 07/15/2021   Hyperkalemia 03/17/2021   Elevated troponin 03/17/2021   Back pain 03/17/2021   Hypertensive heart and kidney disease with heart failure and with chronic kidney disease stage III ([place]) 12/17/2020   Chronic diastolic heart failure ([place]) 12/17/2020   CKD stage 3 due to  type 2 diabetes mellitus ([place]) 12/17/2020   Hyperlipidemia associated with type 2 diabetes mellitus ([place]) 12/17/2020   Anxiety 12/17/2020   Chronic non-seasonal allergic rhinitis 12/17/2020   Hypotension 12/10/2020   Acute renal failure superimposed on stage 3b chronic kidney disease ([place]) 12/10/2020   Acute renal failure superimposed on stage 3b chronic kidney disease, unspecified acute renal failure type ([place]) 12/10/2020   AKI (acute kidney injury) ([place]) 12/09/2020   GERD (gastroesophageal reflux disease) 10/09/2020   Portal hypertension ([place]) 08/26/2020   Pre-transplant evaluation for liver transplant 08/26/2020   Atherosclerotic heart disease of native coronary artery without angina pectoris 07/17/2020   Unspecified cirrhosis of liver ([place]) 07/17/2020   Anasarca 07/17/2020   Heart failure, unspecified ([place]) 07/17/2020   Morbid obesity ([place]) 07/17/2020   Acute on chronic heart failure with preserved ejection fraction (HCC)    Liver failure ([place]) 06/20/2020   Esophageal varices ([place]) 01/09/2020   History of adenomatous polyp of colon 01/09/2020   Elevated AST (SGOT) 08/23/2019   Elevation of level of transaminase and lactic acid dehydrogenase (LDH) 08/23/2019   Diarrhea 07/17/2019   Hypersomnia, persistent 03/12/2019   Macrocytic anemia 01/18/2019   Other pancytopenia ([place]) 01/18/2019   Anemia 01/18/2019   Thrombocytopenia ([place]) 12/30/2018   Polyclonal gammopathy 12/30/2018   S/P total knee replacement, left 03/09/17 11/22/2017   Primary osteoarthritis of left knee 03/09/2017  History of nocturia 03/02/2017   OSA on CPAP 03/02/2016   Hypoxia 03/02/2016   Obstructive sleep apnea (adult) (pediatric) 03/02/2016   Cyst of mediastinum 01/15/2016   S/P CABG x 4 08/29/2015   Obesity, Class III, BMI 40-49.9 (morbid obesity) ([place])    Hyperglycemia    Pain in the chest 08/23/2015   Non-ST elevation MI (NSTEMI) ([place]) 08/23/2015   Diabetes mellitus type 2, uncontrolled ([place]) 08/23/2015    Essential hypertension 08/23/2015   Hyperlipidemia 08/23/2015   Type 2 diabetes mellitus with hypoglycemia with coma ([place]) 08/23/2015   Myocardial infarction ([place]) 08/23/2015   Type 2 diabetes mellitus with hyperglycemia ([place]) 2016   Class 2 obesity 2016   Hyponatremia 2016   Osteoarthritis, knee 05/24/2012   Knee pain 10/29/2011   Knee stiffness 10/29/2011   S/P right knee arthroscopy 10/26/2011   Medial meniscus, posterior horn derangement 09/07/2011   Lateral meniscus derangement 09/07/2011   OA (osteoarthritis) of knee 09/07/2011   Rotator cuff syndrome of left shoulder 08/19/2011   Right knee meniscal tear 08/19/2011    Past Surgical History:  Procedure Laterality Date   APPENDECTOMY     Biceps tendon surgery Right    BIOPSY  11/22/2019   Procedure: BIOPSY;  Surgeon: Daneil Dolin, MD;  Location[facility];  Service: Endoscopy;;   CARDIAC CATHETERIZATION N/A 08/25/2015   Procedure: Left Heart Cath and Coronary Angiography;  Surgeon: Belva Crome, MD;  Location: [place] [facility];  Service: Cardiovascular;  Laterality: N/A;   COLONOSCOPY WITH PROPOFOL N/A 11/22/2019   Procedure: COLONOSCOPY WITH PROPOFOL;  Surgeon: Daneil Dolin, MD; Four 4-5 mm polyps, findings suggestive of portal colopathy, congested appearing colonic mucosa diffusely, rectal varices, and adequate right colon prep.  Pathology with tubular adenomas and hyperplastic polyp.  Right colon biopsy with focal active colitis.  Recommendations to repeat colonoscopy in 3 months due to poor prep.   COLONOSCOPY WITH PROPOFOL N/A 03/17/2020   Procedure: COLONOSCOPY WITH PROPOFOL;  Surgeon: Daneil Dolin, MD;  Location: [facility];  Service: Endoscopy;  Laterality: N/A;  8:45am - pt does not need covid test, was + 2/4 <90 days   CORONARY ARTERY BYPASS GRAFT N/A 08/29/2015   Procedure: CORONARY ARTERY BYPASS GRAFTING (CABG);  Surgeon: [name], MD;  Location: [place];  Service: Open Heart Surgery;   Laterality: N/A;   ESOPHAGEAL BANDING N/A 07/23/2021   Procedure: ESOPHAGEAL BANDING;  Surgeon: Eloise Harman, DO;  [facility];  Service: Endoscopy;  Laterality: N/A;   ESOPHAGOGASTRODUODENOSCOPY (EGD) WITH PROPOFOL N/A 11/22/2019   Procedure: ESOPHAGOGASTRODUODENOSCOPY (EGD) WITH PROPOFOL;  Surgeon: Daneil Dolin, MD; 4 columns of grade 2-3 esophageal varices, portal gastropathy, gastric polyp/abnormal gastric mucosa s/p biopsy.  Pathology with hyperplastic polyp, mild chronic gastritis, negative H. pylori.   ESOPHAGOGASTRODUODENOSCOPY (EGD) WITH PROPOFOL N/A 07/23/2021   Procedure: ESOPHAGOGASTRODUODENOSCOPY (EGD) WITH PROPOFOL;  Surgeon: Eloise Harman, DO;  [facility];  Service: Endoscopy;  Laterality: N/A;   KNEE ARTHROSCOPY Left    TEE WITHOUT CARDIOVERSION N/A 08/29/2015   Procedure: TRANSESOPHAGEAL ECHOCARDIOGRAM (TEE);  Surgeon: [name], MD;  Location: [place];  Service: Open Heart Surgery;  Laterality: N/A;   TOTAL KNEE ARTHROPLASTY Left 03/09/2017   Procedure: TOTAL KNEE ARTHROPLASTY;  Surgeon: Carole Civil, MD;  Location: [facility];  Service: Orthopedics;  Laterality: Left;       Family History  Problem Relation Age of Onset   Arthritis Other  Cancer Other    Diabetes Other    CAD Father    Diabetes Mellitus II Father    Liver cancer Father    Hodgkin's lymphoma Father    CAD Brother    Diabetes Mellitus II Brother    ALS Mother    Diabetes Mellitus II Sister    Diabetes Mellitus II Brother    Diabetes Mellitus II Maternal Grandmother    Aneurysm Maternal Grandmother    Cancer Maternal Grandfather    Anesthesia problems Neg Hx    Hypotension Neg Hx    Malignant hyperthermia Neg Hx    Pseudochol deficiency Neg Hx    Colon cancer Neg Hx     Social History   Tobacco Use   Smoking status: Never   Smokeless tobacco: Never  Vaping Use   Vaping Use: Never used  Substance Use Topics   Alcohol use: Not Currently   Drug  use: No    Home Medications Prior to Admission medications   Medication Sig Start Date End Date Taking? Authorizing Provider  acetaminophen (TYLENOL) 325 MG tablet Take 650 mg by mouth every 6 (six) hours as needed for mild pain. 12/19/20   [provider]  albuterol (VENTOLIN HFA) 108 (90 Base) MCG/ACT inhaler Inhale 2 puffs into the lungs every 6 (six) hours as needed for wheezing or shortness of breath. 12/22/20   Gerlene Fee, NP  ALPRAZolam Duanne Moron) 0.5 MG tablet Take 1 tablet (0.5 mg total) by mouth daily as needed. 12/22/20   Gerlene Fee, NP  aspirin EC 81 MG tablet Take 81 mg by mouth at bedtime.     [provider]  Cholecalciferol (DIALYVITE VITAMIN D 5000) 125 MCG (5000 UT) capsule Take 5,000 Units by mouth in the morning.     [provider]  Continuous Blood Gluc Receiver (FREESTYLE LIBRE 14 DAY READER) DEVI 1 reader 12/22/20   Gerlene Fee, NP  Continuous Blood Gluc Sensor (FREESTYLE LIBRE 14 DAY SENSOR) MISC 1 sensor 12/28/17   [provider]  Elastic Bandages & Supports ([place]) [place] 1 each by Does not apply route as directed. Low pressure knee high compression stockings Dx: leg swelling 05/13/20   Satira Sark, MD  ferrous sulfate 325 (65 FE) MG tablet Take 325 mg by mouth in the morning.     [provider]  Flaxseed, Linseed, (FLAXSEED OIL) 1000 MG CAPS Take 1,000 mg by mouth at bedtime. 12/16/20   [provider]  INS SYRINGE/NEEDLE .5CC/28G 28G X 1/2" 0.5 ML MISC Inject 1 Syringe into the skin See admin instructions. 12/22/20   Gerlene Fee, NP  insulin aspart (NOVOLOG) 100 UNIT/ML injection Inject 5 Units into the skin 3 (three) times daily before meals. Special Instructions: If accu-check is greater than 150. Hold for accu-check 150 or below. With Meals 12/22/20   Gerlene Fee, NP  insulin degludec (TRESIBA FLEXTOUCH) 200 UNIT/ML FlexTouch Pen Inject 40 Units into the skin in the  morning. Give 30 units Subcutaneous  at Bedtime 8:00 pm 12/22/20   Gerlene Fee, NP  lactulose (CHRONULAC) 10 GM/15ML solution Take 30 mLs (20 g total) by mouth daily. 12/22/20   Gerlene Fee, NP  lidocaine (XYLOCAINE) 2 % solution 5 mLs as needed. Patient not taking: Reported on 07/28/2021 06/18/21   [provider]  loratadine (CLARITIN) 10 MG tablet Take 10 mg by mouth in the morning.     [provider]  Magnesium 250 MG  TABS Take 250 mg by mouth daily.    [provider]  Misc Natural Products (ELDERBERRY ZINC/VIT C/IMMUNE) LOZG Take 1 lozenge by mouth at bedtime. 12/16/20   [provider]  nadolol (CORGARD) 20 MG tablet Take by mouth daily. 12/18/20   [provider]  OZEMPIC, 1 MG/DOSE, 4 MG/3ML SOPN Inject 1 mg into the skin once a week. 12/22/20   Gerlene Fee, NP  pantoprazole (PROTONIX) 40 MG tablet Take 1 tablet (40 mg total) by mouth 2 (two) times daily before a meal. 07/25/21 09/19/21  Donne Hazel, MD  potassium chloride SA (KLOR-CON M20) 20 MEQ tablet Take 1 tablet (20 mEq total) by mouth 2 (two) times daily. 08/07/21 11/05/21  Satira Sark, MD  pravastatin (PRAVACHOL) 20 MG tablet TAKE 1 TABLET(20 MG) BY MOUTH AT BEDTIME 12/22/20   Gerlene Fee, NP  sucralfate (CARAFATE) 1 g tablet Take 1 tablet (1 g total) by mouth 4 (four) times daily for 14 days. 07/25/21 08/08/21  Donne Hazel, MD  torsemide (DEMADEX) 20 MG tablet Take 2 tablets (40 mg total) by mouth 2 (two) times daily. 08/07/21 11/05/21  Satira Sark, MD  TURMERIC CURCUMIN PO Take 2,000 mg by mouth at bedtime.     [provider]  venlafaxine XR (EFFEXOR-XR) 37.5 MG 24 hr capsule Take 37.5 mg by mouth daily. 04/01/21   [provider]    Allergies    Fluticasone  Review of Systems   Review of Systems  Constitutional:  Negative for fever.  Respiratory:  Negative for shortness of breath.   Cardiovascular:  Negative for chest pain.   Gastrointestinal:  Negative for abdominal pain.  Musculoskeletal:  Positive for back pain.  Neurological:  Positive for headaches.  All other systems reviewed and are negative.  Physical Exam Updated Vital Signs BP 106/68   Pulse 74   Temp 98.2 F (36.8 C) (Oral)   Resp 15   Ht 1.778 m (5\' 10" )   Wt 121.6 kg   SpO2 97%   BMI 38.45 kg/m   Physical Exam CONSTITUTIONAL: Chronically ill-appearing HEAD: Normocephalic/atraumatic EYES: EOMI/PERRL ENMT: Mucous membranes dry NECK: supple no meningeal signs SPINE/BACK: Lumbar tenderness noted.  No thoracic tenderness. No cervical spine tenderness No bruising/crepitance/stepoffs noted to spine CV: S1/S2 noted, no murmurs/rubs/gallops noted LUNGS: Lungs are clear to auscultation bilaterally, no apparent distress ABDOMEN: soft, nontender, no rebound or guarding, bowel sounds noted throughout abdomen, no bruising GU:no cva tenderness NEURO: Pt is somnolent but arousable.  Patient appears mildly sedated during exam.  He moves all extremities x4 EXTREMITIES: pulses normal/equal, full ROM No deformities are noted.  Pelvis stable SKIN: warm, color normal PSYCH: no abnormalities of mood noted, alert and oriented to situation  ED Results / Procedures / Treatments   Labs (all labs ordered are listed, but only abnormal results are displayed) Labs Reviewed  CBC WITH DIFFERENTIAL/PLATELET - Abnormal; Notable for the following components:      Result Value   RBC 3.18 (*)    Hemoglobin 9.3 (*)    HCT 29.9 (*)    RDW 20.0 (*)    Platelets 88 (*)    All other components within normal limits  COMPREHENSIVE METABOLIC PANEL - Abnormal; Notable for the following components:   Sodium 131 (*)    Potassium 2.4 (*)    Chloride 86 (*)    CO2 33 (*)    Glucose, Bld 165 (*)    BUN 31 (*)  Creatinine, Ser 1.53 (*)    Calcium 8.4 (*)    Albumin 3.0 (*)    Total Bilirubin 1.9 (*)    GFR, Estimated 51 (*)    All other components within normal  limits  AMMONIA - Abnormal; Notable for the following components:   Ammonia 42 (*)    All other components within normal limits  RESP PANEL BY RT-PCR (FLU A&B, COVID) [id number]  URINALYSIS, ROUTINE W REFLEX MICROSCOPIC    EKG EKG Interpretation  Date/Time:  Tuesday August 11 2021 04:13:00 EDT Ventricular Rate:  79 PR Interval:  163 QRS Duration: 92 QT Interval:  394 QTC Calculation: 452 R Axis:   81 Text Interpretation: Sinus rhythm Borderline right axis deviation Low voltage, precordial leads Borderline T abnormalities, diffuse leads Interpretation limited secondary to artifact Confirmed by Ripley Fraise [phone]) on 08/11/2021 4:23:10 AM  Radiology DG Chest 1 View  Result Date: 08/11/2021 CLINICAL DATA:  Fall.  Pain. EXAM: CHEST  1 VIEW COMPARISON:  07/28/2021 FINDINGS: Low lung volumes with asymmetric elevation right hemidiaphragm. The cardio pericardial silhouette is enlarged. There is pulmonary vascular congestion hazy opacity in the upper lungs bilaterally. Interstitial markings are diffusely coarsened with chronic features. The lungs are clear without focal pneumonia, edema, pneumothorax or pleural effusion. The visualized bony structures of the thorax show no acute abnormality. Telemetry leads overlie the chest. IMPRESSION: Low lung volumes hazy opacity over the upper lungs bilaterally, potentially related to technique although pneumonia not excluded. Dedicated upright PA and lateral chest x-ray recommended to further evaluate. Electronically Signed   By: Misty Stanley M.D.   On: 08/11/2021 05:45   DG Thoracic Spine 2 View  Result Date: 08/11/2021 CLINICAL DATA:  Fall.  Pain. EXAM: THORACIC SPINE 2 VIEWS COMPARISON:  None. FINDINGS: Study limited by bony demineralization and technique. Within this limitation, no thoracic spine fracture evident. Frontal film shows no abnormal paraspinal line. Patient is status post CABG. IMPRESSION: No acute bony abnormality evident in the thoracic  spine. Electronically Signed   By: Misty Stanley M.D.   On: 08/11/2021 05:42   DG Lumbar Spine Complete  Result Date: 08/11/2021 CLINICAL DATA:  Fall.  Pain. EXAM: LUMBAR SPINE - COMPLETE 4+ VIEW COMPARISON:  None. FINDINGS: There is no evidence of lumbar spine fracture. Alignment is normal. Intervertebral disc spaces are maintained. IMPRESSION: Negative. Electronically Signed   By: Misty Stanley M.D.   On: 08/11/2021 05:41   CT HEAD WO CONTRAST (5MM)  Result Date: 08/11/2021 CLINICAL DATA:  Fall.  Neck trauma. EXAM: CT HEAD WITHOUT CONTRAST CT CERVICAL SPINE WITHOUT CONTRAST TECHNIQUE: Multidetector CT imaging of the head and cervical spine was performed following the standard protocol without intravenous contrast. Multiplanar CT image reconstructions of the cervical spine were also generated. COMPARISON:  Cervical spine CT 03/17/2021. head CT 07/08/2016 FINDINGS: CT HEAD FINDINGS Brain: There is no evidence for acute hemorrhage, hydrocephalus, mass lesion, or abnormal extra-axial fluid collection. No definite CT evidence for acute infarction. Vascular: No hyperdense vessel or unexpected calcification. Skull: No evidence for fracture. No worrisome lytic or sclerotic lesion. Sinuses/Orbits: Chronic mucosal disease noted right maxillary sinus. Remaining visualized paranasal sinuses and mastoid air cells are clear. Visualized portions of the globes and intraorbital fat are unremarkable. Other: None. CT CERVICAL SPINE FINDINGS Alignment: Normal. Skull base and vertebrae: No acute fracture. No primary bone lesion or focal pathologic process. Soft tissues and spinal canal: No prevertebral fluid or swelling. No visible canal hematoma. Disc levels:  Loss of disc height noted  C7-T1. Upper chest: Unremarkable. Other: None. IMPRESSION: 1. No acute intracranial abnormality. 2. No cervical spine fracture. Electronically Signed   By: Misty Stanley M.D.   On: 08/11/2021 05:24   CT Cervical Spine Wo Contrast  Result  Date: 08/11/2021 CLINICAL DATA:  Fall.  Neck trauma. EXAM: CT HEAD WITHOUT CONTRAST CT CERVICAL SPINE WITHOUT CONTRAST TECHNIQUE: Multidetector CT imaging of the head and cervical spine was performed following the standard protocol without intravenous contrast. Multiplanar CT image reconstructions of the cervical spine were also generated. COMPARISON:  Cervical spine CT 03/17/2021. head CT 07/08/2016 FINDINGS: CT HEAD FINDINGS Brain: There is no evidence for acute hemorrhage, hydrocephalus, mass lesion, or abnormal extra-axial fluid collection. No definite CT evidence for acute infarction. Vascular: No hyperdense vessel or unexpected calcification. Skull: No evidence for fracture. No worrisome lytic or sclerotic lesion. Sinuses/Orbits: Chronic mucosal disease noted right maxillary sinus. Remaining visualized paranasal sinuses and mastoid air cells are clear. Visualized portions of the globes and intraorbital fat are unremarkable. Other: None. CT CERVICAL SPINE FINDINGS Alignment: Normal. Skull base and vertebrae: No acute fracture. No primary bone lesion or focal pathologic process. Soft tissues and spinal canal: No prevertebral fluid or swelling. No visible canal hematoma. Disc levels:  Loss of disc height noted C7-T1. Upper chest: Unremarkable. Other: None. IMPRESSION: 1. No acute intracranial abnormality. 2. No cervical spine fracture. Electronically Signed   By: Misty Stanley M.D.   On: 08/11/2021 05:24    Procedures .Critical Care  Date/Time: 08/11/2021 7:08 AM Performed by: Ripley Fraise, MD Authorized by: Ripley Fraise, MD   Critical care provider statement:    Critical care time (minutes):  45   Critical care start time:  08/11/2021 6:10 AM   Critical care end time:  08/11/2021 6:55 AM   Critical care time was exclusive of:  Separately billable procedures and treating other patients   Critical care was necessary to treat or prevent imminent or life-threatening deterioration of the following  conditions:  Dehydration and metabolic crisis   Critical care was time spent personally by me on the following activities:  Development of treatment plan with patient or surrogate, evaluation of patient's response to treatment, examination of patient, obtaining history from patient or surrogate, re-evaluation of patient's condition, pulse oximetry, ordering and review of radiographic studies, ordering and review of laboratory studies, ordering and performing treatments and interventions and review of old charts   I assumed direction of critical care for this patient from another provider in my specialty: no     Care discussed with: admitting provider     Medications Ordered in ED Medications  potassium chloride 10 mEq in 100 mL IVPB (10 mEq Intravenous New Bag/Given 08/11/21 0704)  lactulose (CHRONULAC) enema 200 gm (has no administration in time range)  sodium chloride 0.9 % bolus 1,000 mL (1,000 mLs Intravenous New Bag/Given 08/11/21 0704)    ED Course  I have reviewed the triage vital signs and the nursing notes.  Pertinent labs & imaging results that were available during my care of the patient were reviewed by me and considered in my medical decision making (see chart for details).    MDM Rules/Calculators/A&P                           Patient presents after fall.  He has an extensive history including cirrhosis.  He appears mildly sedated and confused.  I am unable to fully exclude a head or cervical spine injury  therefore we will proceed with CT head/spine.  Chest and thoracic/lumbar spine films have been ordered since patient is very unreliable. I spoke to his wife who confirms the  story 7:06 AM All imaging is negative However, patient found to have significant hypokalemia and also dehydration.  Patient is still somnolent however CT head is negative and ammonia is not very elevated.  Urinalysis is still pending. Due to electrolyte derangements, patient will be admitted discussed  with Dr. Josephine Cables for admission He request to give lactulose rectally due to patient somnolence Final Clinical Impression(s) / ED Diagnoses Final diagnoses:  Hypokalemia  Dehydration    Rx / DC Orders ED Discharge Orders     None        Ripley Fraise, MD 08/11/21 0710

## 2021-08-11 NOTE — ED Notes (Signed)
edp in room  

## 2021-08-11 NOTE — Telephone Encounter (Signed)
Returned call to pt. No answer. Left msg to call back.  

## 2021-08-11 NOTE — Telephone Encounter (Signed)
Spoke to pt to verify weight loss and pt stated he weighs 261 lbs.

## 2021-08-11 NOTE — Plan of Care (Signed)
  Problem: Education: Goal: Knowledge of General Education information will improve Description Including pain rating scale, medication(s)/side effects and non-pharmacologic comfort measures Outcome: Progressing   

## 2021-08-11 NOTE — ED Triage Notes (Signed)
Pt suffered fall earlier and now c/o pain all over and unable to stand per ems.

## 2021-08-11 NOTE — Plan of Care (Signed)
  Problem: Education: Goal: Knowledge of General Education information will improve Description: Including pain rating scale, medication(s)/side effects and non-pharmacologic comfort measures Outcome: Progressing   Problem: Activity: Goal: Risk for activity intolerance will decrease Outcome: Progressing   

## 2021-08-11 NOTE — Telephone Encounter (Signed)
Pt verbalized understanding. Pt had no questions or concerns at this time.  °

## 2021-08-11 NOTE — ED Notes (Addendum)
Reminded of urine sample

## 2021-08-11 NOTE — ED Notes (Signed)
Pt to CT/XR.

## 2021-08-11 NOTE — ED Notes (Signed)
Pt incontinent of large amount of urine.

## 2021-08-11 NOTE — ED Notes (Signed)
Pt slightly drowsy, easily rousable, reminded of need for urine specimen

## 2021-08-11 NOTE — Plan of Care (Signed)
  Problem: Acute Rehab PT Goals(only PT should resolve) Goal: Pt Will Go Supine/Side To Sit Outcome: Progressing Flowsheets (Taken 08/11/2021 1550) Pt will go Supine/Side to Sit:  with min guard assist  with minimal assist Goal: Patient Will Transfer Sit To/From Stand Outcome: Progressing Flowsheets (Taken 08/11/2021 1550) Patient will transfer sit to/from stand:  with min guard assist  with minimal assist Goal: Pt Will Transfer Bed To Chair/Chair To Bed Outcome: Progressing Flowsheets (Taken 08/11/2021 1550) Pt will Transfer Bed to Chair/Chair to Bed: with min assist Goal: Pt Will Ambulate Outcome: Progressing Flowsheets (Taken 08/11/2021 1550) Pt will Ambulate:  50 feet  with min guard assist  with minimal assist  with rolling walker   3:51 PM, 08/11/21 Lonell Grandchild, MPT Physical Therapist with [facility] [phone] office [phone] mobile phone

## 2021-08-11 NOTE — ED Notes (Signed)
Attempt to call report to unit, nurse unavailable at this time, will return call

## 2021-08-11 NOTE — ED Notes (Addendum)
Pt aware of need for urine specimen, RT notified of order for ABG

## 2021-08-11 NOTE — H&P (Addendum)
History and Physical    [name] NIO:[id number] DOB: 11-Jun-1961 DOA: 08/11/2021  PCP: [name], MD   Patient coming from: Home  Chief Complaint: Fall  HPI: [name] is a 60 y.o. male with medical history significant for CAD with prior CABG, cirrhosis, hypertension, dyslipidemia, OSA on CPAP, thrombocytopenia, and type 2 diabetes who presented to the ED after falling at home.  He states that he was using his walker when he tripped over a rug.  He is unsure of losing consciousness after hitting his head, but he did have difficulty getting off the floor due to low back pain.  He denies any other recent events and states that he lives with his wife at home and takes his medications as prescribed.  Wife states that he has been getting more deconditioned lately and has been falling more frequently.   ED Course: Stable vital signs noted and patient is afebrile.  Potassium is 2.4, BUN 31, creatinine 1.53, platelets 88,000, sodium 131, and ammonia 42.  EKG with no significant changes identified.  He has been given lactulose enema in the ED as well as some potassium supplementation.  Review of Systems: Reviewed as noted above, otherwise negative.  Past Medical History:  Diagnosis Date   Anemia    Arthritis    CAD (coronary artery disease)    Multivessel disease status post CABG 08/2015   Cirrhosis (HCC)    CKD (chronic kidney disease) stage 3, GFR 30-59 ml/min (HCC)    Diastolic CHF (HCC)    Essential hypertension    Hyperlipidemia    Iron deficiency anemia 07/15/2021   OSA on CPAP    Polyclonal gammopathy    Thrombocytopenia (HCC) 2016   Type 2 diabetes mellitus ([place])     Past Surgical History:  Procedure Laterality Date   APPENDECTOMY     Biceps tendon surgery Right    BIOPSY  11/22/2019   Procedure: BIOPSY;  Surgeon: Daneil Dolin, MD;  [facility];  Service: Endoscopy;;   CARDIAC CATHETERIZATION N/A 08/25/2015   Procedure: Left Heart Cath and  Coronary Angiography;  Surgeon: Belva Crome, MD;  Location: [place] [facility];  Service: Cardiovascular;  Laterality: N/A;   COLONOSCOPY WITH PROPOFOL N/A 11/22/2019   Procedure: COLONOSCOPY WITH PROPOFOL;  Surgeon: Daneil Dolin, MD; Four 4-5 mm polyps, findings suggestive of portal colopathy, congested appearing colonic mucosa diffusely, rectal varices, and adequate right colon prep.  Pathology with tubular adenomas and hyperplastic polyp.  Right colon biopsy with focal active colitis.  Recommendations to repeat colonoscopy in 3 months due to poor prep.   COLONOSCOPY WITH PROPOFOL N/A 03/17/2020   Procedure: COLONOSCOPY WITH PROPOFOL;  Surgeon: Daneil Dolin, MD;  [facility];  Service: Endoscopy;  Laterality: N/A;  8:45am - pt does not need covid test, was + 2/4 <90 days   CORONARY ARTERY BYPASS GRAFT N/A 08/29/2015   Procedure: CORONARY ARTERY BYPASS GRAFTING (CABG);  Surgeon: [name], MD;  Location: [place];  Service: Open Heart Surgery;  Laterality: N/A;   ESOPHAGEAL BANDING N/A 07/23/2021   Procedure: ESOPHAGEAL BANDING;  Surgeon: Eloise Harman, DO;  [facility];  Service: Endoscopy;  Laterality: N/A;   ESOPHAGOGASTRODUODENOSCOPY (EGD) WITH PROPOFOL N/A 11/22/2019   Procedure: ESOPHAGOGASTRODUODENOSCOPY (EGD) WITH PROPOFOL;  Surgeon: Daneil Dolin, MD; 4 columns of grade 2-3 esophageal varices, portal gastropathy, gastric polyp/abnormal gastric mucosa s/p biopsy.  Pathology with hyperplastic polyp, mild chronic gastritis, negative H.  pylori.   ESOPHAGOGASTRODUODENOSCOPY (EGD) WITH PROPOFOL N/A 07/23/2021   Procedure: ESOPHAGOGASTRODUODENOSCOPY (EGD) WITH PROPOFOL;  Surgeon: Eloise Harman, DO;  [facility];  Service: Endoscopy;  Laterality: N/A;   KNEE ARTHROSCOPY Left    TEE WITHOUT CARDIOVERSION N/A 08/29/2015   Procedure: TRANSESOPHAGEAL ECHOCARDIOGRAM (TEE);  Surgeon: [name], MD;  Location: [place];  Service: Open Heart  Surgery;  Laterality: N/A;   TOTAL KNEE ARTHROPLASTY Left 03/09/2017   Procedure: TOTAL KNEE ARTHROPLASTY;  Surgeon: Carole Civil, MD;  Location: [facility];  Service: Orthopedics;  Laterality: Left;     reports that he has never smoked. He has never used smokeless tobacco. He reports that he does not currently use alcohol. He reports that he does not use drugs.  Allergies  Allergen Reactions   Fluticasone Rash    Family History  Problem Relation Age of Onset   Arthritis Other    Cancer Other    Diabetes Other    CAD Father    Diabetes Mellitus II Father    Liver cancer Father    Hodgkin's lymphoma Father    CAD Brother    Diabetes Mellitus II Brother    ALS Mother    Diabetes Mellitus II Sister    Diabetes Mellitus II Brother    Diabetes Mellitus II Maternal Grandmother    Aneurysm Maternal Grandmother    Cancer Maternal Grandfather    Anesthesia problems Neg Hx    Hypotension Neg Hx    Malignant hyperthermia Neg Hx    Pseudochol deficiency Neg Hx    Colon cancer Neg Hx     Prior to Admission medications   Medication Sig Start Date End Date Taking? Authorizing Provider  acetaminophen (TYLENOL) 325 MG tablet Take 650 mg by mouth every 6 (six) hours as needed for mild pain. 12/19/20  Yes [provider]  albuterol (VENTOLIN HFA) 108 (90 Base) MCG/ACT inhaler Inhale 2 puffs into the lungs every 6 (six) hours as needed for wheezing or shortness of breath. 12/22/20  Yes Gerlene Fee, NP  ALPRAZolam Duanne Moron) 0.5 MG tablet Take 1 tablet (0.5 mg total) by mouth daily as needed. 12/22/20  Yes Gerlene Fee, NP  aspirin EC 81 MG tablet Take 81 mg by mouth at bedtime.    Yes [provider]  Cholecalciferol (DIALYVITE VITAMIN D 5000) 125 MCG (5000 UT) capsule Take 5,000 Units by mouth in the morning.    Yes [provider]  ferrous sulfate 325 (65 FE) MG tablet Take 325 mg by mouth in the morning.    Yes [provider]  Flaxseed, Linseed,  (FLAXSEED OIL) 1000 MG CAPS Take 1,000 mg by mouth at bedtime. 12/16/20  Yes [provider]  insulin aspart (NOVOLOG) 100 UNIT/ML injection Inject 5 Units into the skin 3 (three) times daily before meals. Special Instructions: If accu-check is greater than 150. Hold for accu-check 150 or below. With Meals 12/22/20  Yes Gerlene Fee, NP  insulin degludec (TRESIBA FLEXTOUCH) 200 UNIT/ML FlexTouch Pen Inject 40 Units into the skin in the morning. Give 30 units Subcutaneous  at Bedtime 8:00 pm 12/22/20  Yes Gerlene Fee, NP  lactulose (CHRONULAC) 10 GM/15ML solution Take 30 mLs (20 g total) by mouth daily. 12/22/20  Yes Gerlene Fee, NP  loratadine (CLARITIN) 10 MG tablet Take 10 mg by mouth in the morning.    Yes [provider]  Magnesium 250 MG TABS Take 250 mg by mouth daily.  Yes [provider]  Misc Natural Products (ELDERBERRY ZINC/VIT C/IMMUNE) LOZG Take 1 lozenge by mouth at bedtime. 12/16/20  Yes [provider]  nadolol (CORGARD) 20 MG tablet Take by mouth daily. 12/18/20  Yes [provider]  OZEMPIC, 1 MG/DOSE, 4 MG/3ML SOPN Inject 1 mg into the skin once a week. 12/22/20  Yes Gerlene Fee, NP  pantoprazole (PROTONIX) 40 MG tablet Take 1 tablet (40 mg total) by mouth 2 (two) times daily before a meal. 07/25/21 09/19/21 Yes Donne Hazel, MD  potassium chloride SA (KLOR-CON M20) 20 MEQ tablet Take 1 tablet (20 mEq total) by mouth 2 (two) times daily. 08/07/21 11/05/21 Yes Satira Sark, MD  pravastatin (PRAVACHOL) 20 MG tablet TAKE 1 TABLET(20 MG) BY MOUTH AT BEDTIME 12/22/20  Yes Gerlene Fee, NP  torsemide (DEMADEX) 20 MG tablet Take 2 tablets (40 mg total) by mouth 2 (two) times daily. 08/07/21 11/05/21 Yes Satira Sark, MD  TURMERIC CURCUMIN PO Take 2,000 mg by mouth at bedtime.    Yes [provider]  venlafaxine XR (EFFEXOR-XR) 37.5 MG 24 hr capsule Take 37.5 mg by mouth daily. 04/01/21  Yes [provider]   lidocaine (XYLOCAINE) 2 % solution 5 mLs as needed. Patient not taking: Reported on 07/28/2021 06/18/21   [provider]  sucralfate (CARAFATE) 1 g tablet Take 1 tablet (1 g total) by mouth 4 (four) times daily for 14 days. 07/25/21 08/08/21  Donne Hazel, MD    Physical Exam: Vitals:   08/11/21 0730 08/11/21 0800 08/11/21 0830 08/11/21 0900  BP: 103/60 (!) 107/55 125/67 112/66  Pulse: 73 72 72 73  Resp: 20 20 18 14   Temp:      TempSrc:      SpO2: 98% 100% 97% 99%  Weight:      Height:        Constitutional: NAD, calm, comfortable Vitals:   08/11/21 0730 08/11/21 0800 08/11/21 0830 08/11/21 0900  BP: 103/60 (!) 107/55 125/67 112/66  Pulse: 73 72 72 73  Resp: 20 20 18 14   Temp:      TempSrc:      SpO2: 98% 100% 97% 99%  Weight:      Height:       Eyes: lids and conjunctivae normal Neck: normal, supple Respiratory: clear to auscultation bilaterally. Normal respiratory effort. No accessory muscle use.  Cardiovascular: Regular rate and rhythm, no murmurs. Abdomen: no tenderness, no distention. Bowel sounds positive.  Musculoskeletal:  No edema. Skin: no rashes, lesions, ulcers.  Psychiatric: Flat affect  Labs on Admission: I have personally reviewed following labs and imaging studies  CBC: Recent Labs  Lab 08/11/21 0558  WBC 5.7  NEUTROABS 3.8  HGB 9.3*  HCT 29.9*  MCV 94.0  PLT 88*   Basic Metabolic Panel: Recent Labs  Lab 08/11/21 0558  NA 131*  K 2.4*  CL 86*  CO2 33*  GLUCOSE 165*  BUN 31*  CREATININE 1.53*  CALCIUM 8.4*   GFR: Estimated Creatinine Clearance: 66.3 mL/min (A) (by C-G formula based on SCr of 1.53 mg/dL (H)). Liver Function Tests: Recent Labs  Lab 08/11/21 0558  AST 38  ALT 15  ALKPHOS 66  BILITOT 1.9*  PROT 6.8  ALBUMIN 3.0*   No results for input(s): LIPASE, AMYLASE in the last 168 hours. Recent Labs  Lab 08/11/21 0558  AMMONIA 42*   Coagulation Profile: No results for input(s): INR, PROTIME in the last 168  hours. Cardiac Enzymes:  No results for input(s): CKTOTAL, CKMB, CKMBINDEX, TROPONINI in the last 168 hours. BNP (last 3 results) No results for input(s): PROBNP in the last 8760 hours. HbA1C: No results for input(s): HGBA1C in the last 72 hours. CBG: No results for input(s): GLUCAP in the last 168 hours. Lipid Profile: No results for input(s): CHOL, HDL, LDLCALC, TRIG, CHOLHDL, LDLDIRECT in the last 72 hours. Thyroid Function Tests: No results for input(s): TSH, T4TOTAL, FREET4, T3FREE, THYROIDAB in the last 72 hours. Anemia Panel: No results for input(s): VITAMINB12, FOLATE, FERRITIN, TIBC, IRON, RETICCTPCT in the last 72 hours. Urine analysis:    Component Value Date/Time   COLORURINE YELLOW 07/21/2021 1705   APPEARANCEUR HAZY (A) 07/21/2021 1705   LABSPEC 1.018 07/21/2021 1705   PHURINE 6.0 07/21/2021 1705   GLUCOSEU 150 (A) 07/21/2021 1705   HGBUR MODERATE (A) 07/21/2021 1705   BILIRUBINUR NEGATIVE 07/21/2021 1705   KETONESUR 5 (A) 07/21/2021 1705   PROTEINUR NEGATIVE 07/21/2021 1705   UROBILINOGEN 1.0 08/28/2015 1634   NITRITE NEGATIVE 07/21/2021 1705   LEUKOCYTESUR LARGE (A) 07/21/2021 1705    Radiological Exams on Admission: DG Chest 1 View  Result Date: 08/11/2021 CLINICAL DATA:  Fall.  Pain. EXAM: CHEST  1 VIEW COMPARISON:  07/28/2021 FINDINGS: Low lung volumes with asymmetric elevation right hemidiaphragm. The cardio pericardial silhouette is enlarged. There is pulmonary vascular congestion hazy opacity in the upper lungs bilaterally. Interstitial markings are diffusely coarsened with chronic features. The lungs are clear without focal pneumonia, edema, pneumothorax or pleural effusion. The visualized bony structures of the thorax show no acute abnormality. Telemetry leads overlie the chest. IMPRESSION: Low lung volumes hazy opacity over the upper lungs bilaterally, potentially related to technique although pneumonia not excluded. Dedicated upright PA and lateral chest  x-ray recommended to further evaluate. Electronically Signed   By: Misty Stanley M.D.   On: 08/11/2021 05:45   DG Thoracic Spine 2 View  Result Date: 08/11/2021 CLINICAL DATA:  Fall.  Pain. EXAM: THORACIC SPINE 2 VIEWS COMPARISON:  None. FINDINGS: Study limited by bony demineralization and technique. Within this limitation, no thoracic spine fracture evident. Frontal film shows no abnormal paraspinal line. Patient is status post CABG. IMPRESSION: No acute bony abnormality evident in the thoracic spine. Electronically Signed   By: Misty Stanley M.D.   On: 08/11/2021 05:42   DG Lumbar Spine Complete  Result Date: 08/11/2021 CLINICAL DATA:  Fall.  Pain. EXAM: LUMBAR SPINE - COMPLETE 4+ VIEW COMPARISON:  None. FINDINGS: There is no evidence of lumbar spine fracture. Alignment is normal. Intervertebral disc spaces are maintained. IMPRESSION: Negative. Electronically Signed   By: Misty Stanley M.D.   On: 08/11/2021 05:41   CT HEAD WO CONTRAST (5MM)  Result Date: 08/11/2021 CLINICAL DATA:  Fall.  Neck trauma. EXAM: CT HEAD WITHOUT CONTRAST CT CERVICAL SPINE WITHOUT CONTRAST TECHNIQUE: Multidetector CT imaging of the head and cervical spine was performed following the standard protocol without intravenous contrast. Multiplanar CT image reconstructions of the cervical spine were also generated. COMPARISON:  Cervical spine CT 03/17/2021. head CT 07/08/2016 FINDINGS: CT HEAD FINDINGS Brain: There is no evidence for acute hemorrhage, hydrocephalus, mass lesion, or abnormal extra-axial fluid collection. No definite CT evidence for acute infarction. Vascular: No hyperdense vessel or unexpected calcification. Skull: No evidence for fracture. No worrisome lytic or sclerotic lesion. Sinuses/Orbits: Chronic mucosal disease noted right maxillary sinus. Remaining visualized paranasal sinuses and mastoid air cells are clear. Visualized portions of the globes and intraorbital fat are unremarkable. Other: None.  CT CERVICAL  SPINE FINDINGS Alignment: Normal. Skull base and vertebrae: No acute fracture. No primary bone lesion or focal pathologic process. Soft tissues and spinal canal: No prevertebral fluid or swelling. No visible canal hematoma. Disc levels:  Loss of disc height noted C7-T1. Upper chest: Unremarkable. Other: None. IMPRESSION: 1. No acute intracranial abnormality. 2. No cervical spine fracture. Electronically Signed   By: Misty Stanley M.D.   On: 08/11/2021 05:24   CT Cervical Spine Wo Contrast  Result Date: 08/11/2021 CLINICAL DATA:  Fall.  Neck trauma. EXAM: CT HEAD WITHOUT CONTRAST CT CERVICAL SPINE WITHOUT CONTRAST TECHNIQUE: Multidetector CT imaging of the head and cervical spine was performed following the standard protocol without intravenous contrast. Multiplanar CT image reconstructions of the cervical spine were also generated. COMPARISON:  Cervical spine CT 03/17/2021. head CT 07/08/2016 FINDINGS: CT HEAD FINDINGS Brain: There is no evidence for acute hemorrhage, hydrocephalus, mass lesion, or abnormal extra-axial fluid collection. No definite CT evidence for acute infarction. Vascular: No hyperdense vessel or unexpected calcification. Skull: No evidence for fracture. No worrisome lytic or sclerotic lesion. Sinuses/Orbits: Chronic mucosal disease noted right maxillary sinus. Remaining visualized paranasal sinuses and mastoid air cells are clear. Visualized portions of the globes and intraorbital fat are unremarkable. Other: None. CT CERVICAL SPINE FINDINGS Alignment: Normal. Skull base and vertebrae: No acute fracture. No primary bone lesion or focal pathologic process. Soft tissues and spinal canal: No prevertebral fluid or swelling. No visible canal hematoma. Disc levels:  Loss of disc height noted C7-T1. Upper chest: Unremarkable. Other: None. IMPRESSION: 1. No acute intracranial abnormality. 2. No cervical spine fracture. Electronically Signed   By: Misty Stanley M.D.   On: 08/11/2021 05:24    EKG:  Independently reviewed. SR 79bpm.  Assessment/Plan Active Problems:   Hypokalemia    Fall -PT evaluation with possible need for placement due to frequent falls at home -Fall precautions -No signs of injury on imaging  AKI-likely prerenal -Appears dehydrated -IVF -Avoid nephrotoxic agents -Repeat BMP in am  Hypokalemia -Replete IV and PO -Recheck in am  CAD/HTN/HLD -Prior CABG -Continue home medications  Chronic thrombocytopenia -Likely related to liver cirrhosis -Continue to monitor -Monitor repeat CBC -No heparin agents  Iron deficiency anemia -Continue Fe supplementation  DM2 -SSI -Carb modified diet  Liver cirrhosis -Monitor ammonia levels; no significant encephalopathy currently noted -Continue home lactulose -Monitor ammonia  OSA -CPAP at night  DVT prophylaxis: SCDs Code Status: Full Family Communication: Discussed with wife on phone Disposition Plan:Fall evaluation Consults called:None Admission status: Obs, Tele   Vestal Crandall D Chistopher Mangino DO [facility]  If 7PM-7AM, please contact night-coverage [email or web address]  08/11/2021, 10:30 AM

## 2021-08-12 DIAGNOSIS — E860 Dehydration: Secondary | ICD-10-CM | POA: Diagnosis not present

## 2021-08-12 DIAGNOSIS — R7989 Other specified abnormal findings of blood chemistry: Secondary | ICD-10-CM | POA: Diagnosis not present

## 2021-08-12 DIAGNOSIS — Y92009 Unspecified place in unspecified non-institutional (private) residence as the place of occurrence of the external cause: Secondary | ICD-10-CM

## 2021-08-12 DIAGNOSIS — E1165 Type 2 diabetes mellitus with hyperglycemia: Secondary | ICD-10-CM

## 2021-08-12 DIAGNOSIS — D638 Anemia in other chronic diseases classified elsewhere: Secondary | ICD-10-CM

## 2021-08-12 DIAGNOSIS — D696 Thrombocytopenia, unspecified: Secondary | ICD-10-CM

## 2021-08-12 DIAGNOSIS — K7200 Acute and subacute hepatic failure without coma: Secondary | ICD-10-CM | POA: Diagnosis not present

## 2021-08-12 DIAGNOSIS — K7030 Alcoholic cirrhosis of liver without ascites: Secondary | ICD-10-CM | POA: Diagnosis not present

## 2021-08-12 DIAGNOSIS — E876 Hypokalemia: Secondary | ICD-10-CM | POA: Diagnosis not present

## 2021-08-12 DIAGNOSIS — W19XXXA Unspecified fall, initial encounter: Secondary | ICD-10-CM

## 2021-08-12 LAB — BASIC METABOLIC PANEL
Anion gap: 8 (ref 5–15)
BUN: 25 mg/dL — ABNORMAL HIGH (ref 8–23)
CO2: 33 mmol/L — ABNORMAL HIGH (ref 22–32)
Calcium: 8.6 mg/dL — ABNORMAL LOW (ref 8.9–10.3)
Chloride: 94 mmol/L — ABNORMAL LOW (ref 98–111)
Creatinine, Ser: 1.3 mg/dL — ABNORMAL HIGH (ref 0.61–1.24)
GFR, Estimated: >60 mL/min (ref >60)
Glucose, Bld: 251 mg/dL — ABNORMAL HIGH (ref 70–99)
Potassium: 3.6 mmol/L (ref 3.5–5.1)
Sodium: 135 mmol/L (ref 135–145)

## 2021-08-12 LAB — GLUCOSE, CAPILLARY
Glucose-Capillary: 241 mg/dL — ABNORMAL HIGH (ref 70–99)
Glucose-Capillary: 217 mg/dL — ABNORMAL HIGH (ref 70–99)
Glucose-Capillary: 251 mg/dL — ABNORMAL HIGH (ref 70–99)
Glucose-Capillary: 262 mg/dL — ABNORMAL HIGH (ref 70–99)

## 2021-08-12 LAB — CBC
HCT: 27.5 % — ABNORMAL LOW (ref 39.0–52.0)
Hemoglobin: 8.4 g/dL — ABNORMAL LOW (ref 13.0–17.0)
MCH: 29.2 pg (ref 26.0–34.0)
MCHC: 30.5 g/dL (ref 30.0–36.0)
MCV: 95.5 fL (ref 80.0–100.0)
Platelets: 83 10*3/uL — ABNORMAL LOW (ref 150–400)
RBC: 2.88 MIL/uL — ABNORMAL LOW (ref 4.22–5.81)
RDW: 19.9 % — ABNORMAL HIGH (ref 11.5–15.5)
WBC: 5.1 10*3/uL (ref 4.0–10.5)
nRBC: 0 % (ref 0.0–0.2)

## 2021-08-12 LAB — MAGNESIUM: Magnesium: 1.9 mg/dL (ref 1.7–2.4)

## 2021-08-12 LAB — AMMONIA: Ammonia: 81 umol/L — ABNORMAL HIGH (ref 9–35)

## 2021-08-12 MED ORDER — INSULIN ASPART 100 UNIT/ML ~~LOC~~ SOLN
10.0000 [IU] | Freq: Three times a day (TID) | SUBCUTANEOUS | Status: DC
  Administered 2021-08-13 – 2021-08-14 (×6): 10 [IU] via SUBCUTANEOUS
  Filled 2021-08-12: qty 0.1

## 2021-08-12 MED ORDER — METHOCARBAMOL 500 MG PO TABS
500.0000 mg | ORAL_TABLET | Freq: Two times a day (BID) | ORAL | Status: DC | PRN
  Administered 2021-08-12 – 2021-08-25 (×10): 500 mg via ORAL
  Filled 2021-08-12 (×11): qty 1

## 2021-08-12 MED ORDER — ALBUMIN HUMAN 25 % IV SOLN
12.5000 g | Freq: Once | INTRAVENOUS | Status: AC
  Administered 2021-08-12: 12.5 g via INTRAVENOUS
  Filled 2021-08-12: qty 50

## 2021-08-12 MED ORDER — RIFAXIMIN 550 MG PO TABS
550.0000 mg | ORAL_TABLET | Freq: Two times a day (BID) | ORAL | Status: DC
  Administered 2021-08-12 – 2021-08-25 (×26): 550 mg via ORAL
  Filled 2021-08-12 (×26): qty 1

## 2021-08-12 NOTE — Progress Notes (Signed)
Physical Therapy Treatment Patient Details Name: [name] MRN: [id number] DOB: [birth year] Today's Date: 08/12/2021    History of Present Illness [name] is a 60 y.o. male with medical history significant for CAD with prior CABG, cirrhosis, hypertension, dyslipidemia, OSA on CPAP, thrombocytopenia, and type 2 diabetes who presented to the ED after falling at home.  He states that he was using his walker when he tripped over a rug.  He is unsure of losing consciousness after hitting his head, but he did have difficulty getting off the floor due to low back pain.  He denies any other recent events and states that he lives with his wife at home and takes his medications as prescribed.  Wife states that he has been getting more deconditioned lately and has been falling more frequently.    PT Comments    Patient demonstrates slow labored movement for rolling to side and sitting up from side lying position requiring Mod assist due to generalized weakness, required bed slightly raised to complete sit to stands and limited to a few steps at bedside before having to sit due to fatigue.  Patient tolerated sitting up in chair after therapy - nursing staff aware.  Patient will benefit from continued physical therapy in hospital and recommended venue below to increase strength, balance, endurance for safe ADLs and gait.     Follow Up Recommendations  Home health PT;Supervision for mobility/OOB;Supervision - Intermittent     Equipment Recommendations  None recommended by PT    Recommendations for Other Services       Precautions / Restrictions Precautions Precautions: Fall Restrictions Weight Bearing Restrictions: No    Mobility  Bed Mobility Overal bed mobility: Needs Assistance Bed Mobility: Rolling;Sidelying to Sit Rolling: Mod assist Sidelying to sit: Mod assist       General bed mobility comments: slow labored movement with fair carryover for rolling to side and  sitting up from sidelying position    Transfers Overall transfer level: Needs assistance Equipment used: Rolling walker (2 wheeled) Transfers: Stand Pivot Transfers;Sit to/from Stand Sit to Stand: Mod assist;From elevated surface Stand pivot transfers: Mod assist       General transfer comment: required bed slightly elevated in order to complete sit to stand from bed side  Ambulation/Gait Ambulation/Gait assistance: Mod assist Gait Distance (Feet): 5 Feet Assistive device: Rolling walker (2 wheeled) Gait Pattern/deviations: Decreased step length - right;Decreased step length - left;Decreased stance time - left Gait velocity: decreased   General Gait Details: limited to 5-6 slow labored side steps and stepping backwards before having to sit due to fatigue   Stairs             Wheelchair Mobility    Modified Rankin (Stroke Patients Only)       Balance Overall balance assessment: Needs assistance Sitting-balance support: Feet supported;No upper extremity supported Sitting balance-Leahy Scale: Fair Sitting balance - Comments: fair/good seated at EOB   Standing balance support: During functional activity;Bilateral upper extremity supported Standing balance-Leahy Scale: Poor Standing balance comment: fair/poor using RW                            Cognition Arousal/Alertness: Awake/alert Behavior During Therapy: WFL for tasks assessed/performed Overall Cognitive Status: Within Functional Limits for tasks assessed  Exercises General Exercises - Lower Extremity Long Arc Quad: Seated;AROM;Strengthening;Both;10 reps Hip Flexion/Marching: Seated;AROM;Strengthening;Both;10 reps Toe Raises: Seated;AROM;Strengthening;Both;10 reps Heel Raises: Seated;AROM;Strengthening;Both;10 reps    General Comments        Pertinent Vitals/Pain Pain Assessment: Faces Faces Pain Scale: Hurts even more Pain  Location: low back Pain Descriptors / Indicators: Sore;Aching;Grimacing Pain Intervention(s): Limited activity within patient's tolerance;Monitored during session;Premedicated before session;Repositioned    Home Living                      Prior Function            PT Goals (current goals can now be found in the care plan section) Acute Rehab PT Goals Patient Stated Goal: Return home PT Goal Formulation: With patient Time For Goal Achievement: 08/14/21 Potential to Achieve Goals: Fair Progress towards PT goals: Progressing toward goals    Frequency    Min 3X/week      PT Plan      Co-evaluation              AM-PAC PT "6 Clicks" Mobility   Outcome Measure  Help needed turning from your back to your side while in a flat bed without using bedrails?: A Little Help needed moving from lying on your back to sitting on the side of a flat bed without using bedrails?: A Lot Help needed moving to and from a bed to a chair (including a wheelchair)?: A Lot Help needed standing up from a chair using your arms (e.g., wheelchair or bedside chair)?: A Lot Help needed to walk in hospital room?: A Lot Help needed climbing 3-5 steps with a railing? : Total 6 Click Score: 12    End of Session Equipment Utilized During Treatment: Gait belt Activity Tolerance: Patient tolerated treatment well;Patient limited by fatigue Patient left: in chair;with call bell/phone within reach Nurse Communication: Mobility status PT Visit Diagnosis: Unsteadiness on feet (R26.81);Other abnormalities of gait and mobility (R26.89);Muscle weakness (generalized) (M62.81)     Time: 0086-7619 PT Time Calculation (min) (ACUTE ONLY): 30 min  Charges:  $Therapeutic Exercise: 8-22 mins $Therapeutic Activity: 8-22 mins                     2:15 PM, 08/12/21 Lonell Grandchild, MPT Physical Therapist with [facility] [phone] office [phone] mobile phone

## 2021-08-12 NOTE — Progress Notes (Signed)
Patient complained of pain at lower back. Requested a different medication than Tylenol. MD Sherral Hammers aware.

## 2021-08-12 NOTE — Progress Notes (Signed)
Inpatient Diabetes Program Recommendations  AACE/ADA: New Consensus Statement on Inpatient Glycemic Control   Target Ranges:  Prepandial:   less than 140 mg/dL      Peak postprandial:   less than 180 mg/dL (1-2 hours)      Critically ill patients:  140 - 180 mg/dL   Results for ILAN, KAHRS" (MRN [id number]) as of 08/12/2021 08:29  Ref. Range 08/11/2021 12:20 08/11/2021 17:04 08/11/2021 20:45 08/12/2021 07:42  Glucose-Capillary Latest Ref Range: 70 - 99 mg/dL 163 (H) 243 (H) 341 (H) 241 (H)    Review of Glycemic Control  Diabetes history: DM2 Outpatient Diabetes medications: Tresiba 40 units QAM, Tresiba 30 units QHS, Novolog 5 units TID with meals (if CBG >150 mg/dl), Ozempic 1 mg Qweek Current orders for Inpatient glycemic control: Semglee 40 units QAM, Semglee 30 units QHS, Novolog 0-15 units TID with melas, Novolog 0-5 units QHS, Novolog 5 units TID with meals for meal coverage  Inpatient Diabetes Program Recommendations:    Insulin: Please consider increasing Semglee to 45 units QAM and Semglee 35 units QHS.  Thanks, Barnie Alderman, RN, MSN, CDE Diabetes Coordinator Inpatient Diabetes Program [phone] (Team Pager from 8am to 5pm)

## 2021-08-12 NOTE — Progress Notes (Signed)
Spouse called concerned about patient. Stated he was in some pain in his back while she was on the phone with him. Patient was given PO Tylenol for pain. Spouse informed this writing that patient could not take certain medications such as Tylenol due to patients medical history. Noted patient has had increased restlessness this shift. Spouse stated this has been his normal lately. MD Sherral Hammers made aware.

## 2021-08-12 NOTE — Telephone Encounter (Signed)
Spoke to pt's wife and let her know that I had spoke with the pt yesterday afternoon and relayed the medication changes that were initiated by Dr. Domenic Polite. Pt's wife asked if we needed to increase pts potassium d/t it being 2.4 when he had lab work completed this morning. I assured pt's wife that the hospitalist would make sure pt's potassium level was in a normal range before pt is released for home. Pt's wife thanked our office for our call back.

## 2021-08-12 NOTE — Telephone Encounter (Signed)
Pt's wife called stating that the pt lost 23lbs over the weekend.   She had to call EMS yesterday morning because the patient could not get up and was to weak.   His potassium was to low - and he is currently admitted.   Pt's wife would like Dr. Domenic Polite to review his chart and make an assessment.

## 2021-08-12 NOTE — Progress Notes (Signed)
PROGRESS NOTE    [name]  DZH:[id number] DOB: 01-Jul-1961 DOA: 08/11/2021 PCP: Christopher Sites, MD   Brief Narrative:  [name] is a 60 y.o. WM PMHx CAD with prior CABG, cirrhosis, hypertension, dyslipidemia, OSA on CPAP, thrombocytopenia, and type 2 diabetes   Presented to the ED after falling at home.  He states that he was using his walker when he tripped over a rug.  He is unsure of losing consciousness after hitting his head, but he did have difficulty getting off the floor due to low back pain.  He denies any other recent events and states that he lives with his wife at home and takes his medications as prescribed.  Wife states that he has been getting more deconditioned lately and has been falling more frequently.   ED Course: Stable vital signs noted and patient is afebrile.  Potassium is 2.4, BUN 31, creatinine 1.53, platelets 88,000, sodium 131, and ammonia 42.  EKG with no significant changes identified.  He has been given lactulose enema in the ED as well as some potassium supplementation.   Subjective: Afebrile overnight, A/O x4 sitting in chair.  Complaining of lower back pain.  States was not taking his lactulose at home because he ran out.  Per wife Dr. Brigitte [name] informed them that he had complete adrenal failure which was why his BP runs low.   Assessment & Plan:  Covid vaccination; vaccinated 3/3  Active Problems:   Hypokalemia   Fall -PT evaluation with possible need for placement due to frequent falls at home -Fall precautions -No signs of injury on imaging  Hypotension - Midodrine 5 mg TID -8/21 albumin 12.5 g x 1  Muscle spasm -Bilateral oblique muscle spasm LEFT>> RIGHT - Robaxin 500 mg BID   AKI-likely prerenal (baseline Cr 1.2) -Appears dehydrated -IVF -Avoid nephrotoxic agents -Strict in and out - Daily weight Lab Results  Component Value Date   CREATININE 1.30 (H) 08/12/2021   CREATININE 1.53 (H) 08/11/2021   CREATININE 1.24  07/28/2021   CREATININE 1.13 07/25/2021   CREATININE 1.20 07/24/2021  -8/31 improving but not back to baseline   Hypokalemia -Resolved   CAD/HTN/HLD -Prior CABG -Continue home medications   Chronic thrombocytopenia -Likely related to liver cirrhosis Results for Christopher, [name]" (MRN [id number]) as of 08/12/2021 19:19  Ref. Range 07/24/2021 03:49 07/25/2021 05:36 07/28/2021 15:10 08/11/2021 05:58 08/12/2021 06:07  Platelets Latest Ref Range: 150 - 400 K/uL 67 (L) 54 (L) 74 (L) 88 (L) 83 (L)  -No heparin agents -Stable   Iron deficiency anemia -Continue Fe supplementation -8/31 per patient and wife scheduled for iron infusion on Friday if patient still hospitalized will arrange for iron infusion.   DM type II controlled with hyperglycemia -'Semglee 30 units bedtime -Semglee 40 units daily -8/31 increase NovoLog 10 units qac - Moderate SSI   Liver cirrhosis -Per wife increased somnolence, currently A/O x4 - Monitor for encephalopathy  Elevated ammonia - Lactulose 20 g daily - Rifaximin 550 mg BID   OSA -CPAP at night  Obese (BMI 37.67 kg/m) -   DVT prophylaxis: SCD Code Status: Full Family Communication: 8/31 wife at bedside for discussion of plan of care all questions answered Status is: Inpatient  Remains inpatient appropriate because:Inpatient level of care appropriate due to severity of illness  Dispo: The patient is from: Home              Anticipated d/c is to: SNF?  Anticipated d/c date is: 3 days              Patient currently is not medically stable to d/c.      Consultants:    Procedures/Significant Events:     I have personally reviewed and interpreted all radiology studies and my findings are as above.  VENTILATOR SETTINGS:    Cultures   Antimicrobials:    Devices    LINES / TUBES:      Continuous Infusions:   Objective: Vitals:   08/11/21 1600 08/11/21 2000 08/11/21 2250 08/12/21 0406  BP:  125/67 112/68  115/68  [name]: 88 94  (!) 107  Resp: 20 20  19   Temp: 97.6 F (36.4 C) 99.5 F (37.5 C)  98.4 F (36.9 C)  TempSrc: Oral Oral    SpO2: 100% 93% 94% 96%  Weight: 119.1 kg     Height: 5\' 10"  (1.778 m)       Intake/Output Summary (Last 24 hours) at 08/12/2021 1116 Last data filed at 08/12/2021 0846 Gross per 24 hour  Intake 951.17 ml  Output --  Net 951.17 ml   Filed Weights   08/11/21 0403 08/11/21 1600  Weight: 121.6 kg 119.1 kg    Examination:  General: A/O x4, No acute respiratory distress Eyes: negative scleral hemorrhage, negative anisocoria, negative icterus ENT: Negative Runny nose, negative gingival bleeding, Neck:  Negative scars, masses, torticollis, lymphadenopathy, JVD Lungs: Clear to auscultation bilaterally without wheezes or crackles Cardiovascular: Regular rate and rhythm without murmur gallop or rub normal S1 and S2 Abdomen: OBESE, negative abdominal pain, nondistended, positive soft, bowel sounds, no rebound, no ascites, no appreciable mass Extremities: No significant cyanosis, clubbing, or edema bilateral lower extremities Skin: Negative rashes, lesions, ulcers Psychiatric:  Negative depression, negative anxiety, negative fatigue, negative mania  Central nervous system:  Cranial nerves II through XII intact, tongue/uvula midline, all extremities muscle strength 5/5, sensation intact throughout, expressive aphasia, negative receptive aphasia.  .     Data Reviewed: Care during the described time interval was provided by me .  I have reviewed this patient's available data, including medical history, events of note, physical examination, and all test results as part of my evaluation.   CBC: Recent Labs  Lab 08/11/21 0558 08/12/21 0607  WBC 5.7 5.1  NEUTROABS 3.8  --   HGB 9.3* 8.4*  HCT 29.9* 27.5*  MCV 94.0 95.5  PLT 88* 83*   Basic Metabolic Panel: Recent Labs  Lab 08/11/21 0558 08/12/21 0607  NA 131* 135  K 2.4* 3.6  CL 86*  94*  CO2 33* 33*  GLUCOSE 165* 251*  BUN 31* 25*  CREATININE 1.53* 1.30*  CALCIUM 8.4* 8.6*  MG  --  1.9   GFR: Estimated Creatinine Clearance: 77.1 mL/min (A) (by C-G formula based on SCr of 1.3 mg/dL (H)). Liver Function Tests: Recent Labs  Lab 08/11/21 0558  AST 38  ALT 15  ALKPHOS 66  BILITOT 1.9*  PROT 6.8  ALBUMIN 3.0*   No results for input(s): LIPASE, AMYLASE in the last 168 hours. Recent Labs  Lab 08/11/21 0558 08/12/21 0607  AMMONIA 42* 81*   Coagulation Profile: No results for input(s): INR, PROTIME in the last 168 hours. Cardiac Enzymes: No results for input(s): CKTOTAL, CKMB, CKMBINDEX, TROPONINI in the last 168 hours. BNP (last 3 results) No results for input(s): PROBNP in the last 8760 hours. HbA1C: No results for input(s): HGBA1C in the last 72 hours. CBG: Recent Labs  Lab 08/11/21  1220 08/11/21 1704 08/11/21 2045 08/12/21 0742  GLUCAP 163* 243* 341* 241*   Lipid Profile: No results for input(s): CHOL, HDL, LDLCALC, TRIG, CHOLHDL, LDLDIRECT in the last 72 hours. Thyroid Function Tests: No results for input(s): TSH, T4TOTAL, FREET4, T3FREE, THYROIDAB in the last 72 hours. Anemia Panel: No results for input(s): VITAMINB12, FOLATE, FERRITIN, TIBC, IRON, RETICCTPCT in the last 72 hours. Urine analysis:    Component Value Date/Time   COLORURINE YELLOW 08/11/2021 1420   APPEARANCEUR CLEAR 08/11/2021 1420   LABSPEC 1.009 08/11/2021 1420   PHURINE 7.0 08/11/2021 1420   GLUCOSEU NEGATIVE 08/11/2021 1420   HGBUR NEGATIVE 08/11/2021 1420   BILIRUBINUR NEGATIVE 08/11/2021 1420   KETONESUR NEGATIVE 08/11/2021 1420   PROTEINUR NEGATIVE 08/11/2021 1420   UROBILINOGEN 1.0 08/28/2015 1634   NITRITE NEGATIVE 08/11/2021 1420   LEUKOCYTESUR NEGATIVE 08/11/2021 1420   Sepsis Labs: @LABRCNTIP (procalcitonin:4,lacticidven:4)  ) Recent Results (from the past 240 hour(s))  Resp Panel by RT-PCR (Flu A&B, Covid) Nasopharyngeal Swab     Status: None    Collection Time: 08/11/21  7:09 AM   Specimen: Nasopharyngeal Swab; Nasopharyngeal(NP) swabs in vial transport medium  Result Value Ref Range Status   SARS Coronavirus 2 by RT PCR NEGATIVE NEGATIVE Final    Comment: (NOTE) SARS-CoV-2 target nucleic acids are NOT DETECTED.  The SARS-CoV-2 RNA is generally detectable in upper respiratory specimens during the acute phase of infection. The lowest concentration of SARS-CoV-2 viral copies this assay can detect is 138 copies/mL. A negative result does not preclude SARS-Cov-2 infection and should not be used as the sole basis for treatment or other patient management decisions. A negative result may occur with  improper specimen collection/handling, submission of specimen other than nasopharyngeal swab, presence of viral mutation(s) within the areas targeted by this assay, and inadequate number of viral copies(<138 copies/mL). A negative result must be combined with clinical observations, patient history, and epidemiological information. The expected result is Negative.  Fact Sheet for Patients:  EntrepreneurPulse.com.au  Fact Sheet for Healthcare Providers:  IncredibleEmployment.be  This test is no t yet approved or cleared by the [place] FDA and  has been authorized for detection and/or diagnosis of SARS-CoV-2 by FDA under an Emergency Use Authorization (EUA). This EUA will remain  in effect (meaning this test can be used) for the duration of the COVID-19 declaration under Section 564(b)(1) of the Act, 21 U.S.C.section 360bbb-3(b)(1), unless the authorization is terminated  or revoked sooner.       Influenza A by PCR NEGATIVE NEGATIVE Final   Influenza B by PCR NEGATIVE NEGATIVE Final    Comment: (NOTE) The Xpert Xpress SARS-CoV-2/FLU/RSV plus assay is intended as an aid in the diagnosis of influenza from Nasopharyngeal swab specimens and should not be used as a sole basis for treatment.  Nasal washings and aspirates are unacceptable for Xpert Xpress SARS-CoV-2/FLU/RSV testing.  Fact Sheet for Patients: EntrepreneurPulse.com.au  Fact Sheet for Healthcare Providers: IncredibleEmployment.be  This test is not yet approved or cleared by the [place] FDA and has been authorized for detection and/or diagnosis of SARS-CoV-2 by FDA under an Emergency Use Authorization (EUA). This EUA will remain in effect (meaning this test can be used) for the duration of the COVID-19 declaration under Section 564(b)(1) of the Act, 21 U.S.C. section 360bbb-3(b)(1), unless the authorization is terminated or revoked.  Performed at [facility], [address] [place], [place] [address]          Radiology Studies: DG Chest 1 View  Result Date: 08/11/2021 CLINICAL DATA:  Fall.  Pain. EXAM: CHEST  1 VIEW COMPARISON:  07/28/2021 FINDINGS: Low lung volumes with asymmetric elevation right hemidiaphragm. The cardio pericardial silhouette is enlarged. There is pulmonary vascular congestion hazy opacity in the upper lungs bilaterally. Interstitial markings are diffusely coarsened with chronic features. The lungs are clear without focal pneumonia, edema, pneumothorax or pleural effusion. The visualized bony structures of the thorax show no acute abnormality. Telemetry leads overlie the chest. IMPRESSION: Low lung volumes hazy opacity over the upper lungs bilaterally, potentially related to technique although pneumonia not excluded. Dedicated upright PA and lateral chest x-ray recommended to further evaluate. Electronically Signed   By: Misty Stanley M.D.   On: 08/11/2021 05:45   DG Thoracic Spine 2 View  Result Date: 08/11/2021 CLINICAL DATA:  Fall.  Pain. EXAM: THORACIC SPINE 2 VIEWS COMPARISON:  None. FINDINGS: Study limited by bony demineralization and technique. Within this limitation, no thoracic spine fracture evident. Frontal film shows no abnormal  paraspinal line. Patient is status post CABG. IMPRESSION: No acute bony abnormality evident in the thoracic spine. Electronically Signed   By: Misty Stanley M.D.   On: 08/11/2021 05:42   DG Lumbar Spine Complete  Result Date: 08/11/2021 CLINICAL DATA:  Fall.  Pain. EXAM: LUMBAR SPINE - COMPLETE 4+ VIEW COMPARISON:  None. FINDINGS: There is no evidence of lumbar spine fracture. Alignment is normal. Intervertebral disc spaces are maintained. IMPRESSION: Negative. Electronically Signed   By: Misty Stanley M.D.   On: 08/11/2021 05:41   CT HEAD WO CONTRAST (5MM)  Result Date: 08/11/2021 CLINICAL DATA:  Fall.  Neck trauma. EXAM: CT HEAD WITHOUT CONTRAST CT CERVICAL SPINE WITHOUT CONTRAST TECHNIQUE: Multidetector CT imaging of the head and cervical spine was performed following the standard protocol without intravenous contrast. Multiplanar CT image reconstructions of the cervical spine were also generated. COMPARISON:  Cervical spine CT 03/17/2021. head CT 07/08/2016 FINDINGS: CT HEAD FINDINGS Brain: There is no evidence for acute hemorrhage, hydrocephalus, mass lesion, or abnormal extra-axial fluid collection. No definite CT evidence for acute infarction. Vascular: No hyperdense vessel or unexpected calcification. Skull: No evidence for fracture. No worrisome lytic or sclerotic lesion. Sinuses/Orbits: Chronic mucosal disease noted right maxillary sinus. Remaining visualized paranasal sinuses and mastoid air cells are clear. Visualized portions of the globes and intraorbital fat are unremarkable. Other: None. CT CERVICAL SPINE FINDINGS Alignment: Normal. Skull base and vertebrae: No acute fracture. No primary bone lesion or focal pathologic process. Soft tissues and spinal canal: No prevertebral fluid or swelling. No visible canal hematoma. Disc levels:  Loss of disc height noted C7-T1. Upper chest: Unremarkable. Other: None. IMPRESSION: 1. No acute intracranial abnormality. 2. No cervical spine fracture.  Electronically Signed   By: Misty Stanley M.D.   On: 08/11/2021 05:24   CT Cervical Spine Wo Contrast  Result Date: 08/11/2021 CLINICAL DATA:  Fall.  Neck trauma. EXAM: CT HEAD WITHOUT CONTRAST CT CERVICAL SPINE WITHOUT CONTRAST TECHNIQUE: Multidetector CT imaging of the head and cervical spine was performed following the standard protocol without intravenous contrast. Multiplanar CT image reconstructions of the cervical spine were also generated. COMPARISON:  Cervical spine CT 03/17/2021. head CT 07/08/2016 FINDINGS: CT HEAD FINDINGS Brain: There is no evidence for acute hemorrhage, hydrocephalus, mass lesion, or abnormal extra-axial fluid collection. No definite CT evidence for acute infarction. Vascular: No hyperdense vessel or unexpected calcification. Skull: No evidence for fracture. No worrisome lytic or sclerotic lesion. Sinuses/Orbits: Chronic mucosal disease noted right maxillary sinus. Remaining visualized  paranasal sinuses and mastoid air cells are clear. Visualized portions of the globes and intraorbital fat are unremarkable. Other: None. CT CERVICAL SPINE FINDINGS Alignment: Normal. Skull base and vertebrae: No acute fracture. No primary bone lesion or focal pathologic process. Soft tissues and spinal canal: No prevertebral fluid or swelling. No visible canal hematoma. Disc levels:  Loss of disc height noted C7-T1. Upper chest: Unremarkable. Other: None. IMPRESSION: 1. No acute intracranial abnormality. 2. No cervical spine fracture. Electronically Signed   By: Misty Stanley M.D.   On: 08/11/2021 05:24        Scheduled Meds:  aspirin EC  81 mg Oral QHS   ferrous sulfate  325 mg Oral q AM   insulin aspart  0-15 Units Subcutaneous TID WC   insulin aspart  0-5 Units Subcutaneous QHS   insulin aspart  5 Units Subcutaneous TID AC   insulin glargine-yfgn  30 Units Subcutaneous QHS   insulin glargine-yfgn  40 Units Subcutaneous Daily   lactulose  20 g Oral Daily   magnesium oxide  400 mg  Oral Daily   midodrine  5 mg Oral TID WC   nadolol  20 mg Oral Daily   pantoprazole  40 mg Oral BID AC   potassium chloride SA  20 mEq Oral BID   potassium chloride  40 mEq Oral BID   pravastatin  20 mg Oral q1800   venlafaxine XR  37.5 mg Oral Daily   Continuous Infusions:   LOS: 0 days   The patient is critically ill with multiple organ systems failure and requires high complexity decision making for assessment and support, frequent evaluation and titration of therapies, application of advanced monitoring technologies and extensive interpretation of multiple databases. Critical Care Time devoted to patient care services described in this note  Time spent: 40 minutes     Cecilee Rosner, Geraldo Docker, MD [facility]   If 7PM-7AM, please contact night-coverage 08/12/2021, 11:16 AM

## 2021-08-13 ENCOUNTER — Encounter (HOSPITAL_COMMUNITY): Payer: Self-pay | Admitting: Internal Medicine

## 2021-08-13 DIAGNOSIS — K7200 Acute and subacute hepatic failure without coma: Secondary | ICD-10-CM | POA: Diagnosis not present

## 2021-08-13 DIAGNOSIS — K7030 Alcoholic cirrhosis of liver without ascites: Secondary | ICD-10-CM

## 2021-08-13 DIAGNOSIS — E876 Hypokalemia: Secondary | ICD-10-CM | POA: Diagnosis not present

## 2021-08-13 DIAGNOSIS — E860 Dehydration: Secondary | ICD-10-CM | POA: Diagnosis not present

## 2021-08-13 DIAGNOSIS — G4733 Obstructive sleep apnea (adult) (pediatric): Secondary | ICD-10-CM | POA: Diagnosis not present

## 2021-08-13 LAB — CBC WITH DIFFERENTIAL/PLATELET
Abs Immature Granulocytes: 0.02 10*3/uL (ref 0.00–0.07)
Basophils Absolute: 0 10*3/uL (ref 0.0–0.1)
Basophils Relative: 1 %
Eosinophils Absolute: 0.1 10*3/uL (ref 0.0–0.5)
Eosinophils Relative: 3 %
HCT: 30.9 % — ABNORMAL LOW (ref 39.0–52.0)
Hemoglobin: 9.2 g/dL — ABNORMAL LOW (ref 13.0–17.0)
Immature Granulocytes: 1 %
Lymphocytes Relative: 20 %
Lymphs Abs: 0.9 10*3/uL (ref 0.7–4.0)
MCH: 29.1 pg (ref 26.0–34.0)
MCHC: 29.8 g/dL — ABNORMAL LOW (ref 30.0–36.0)
MCV: 97.8 fL (ref 80.0–100.0)
Monocytes Absolute: 0.5 10*3/uL (ref 0.1–1.0)
Monocytes Relative: 13 %
Neutro Abs: 2.7 10*3/uL (ref 1.7–7.7)
Neutrophils Relative %: 62 %
Platelets: 73 10*3/uL — ABNORMAL LOW (ref 150–400)
RBC: 3.16 MIL/uL — ABNORMAL LOW (ref 4.22–5.81)
RDW: 19.8 % — ABNORMAL HIGH (ref 11.5–15.5)
WBC: 4.3 10*3/uL (ref 4.0–10.5)
nRBC: 0 % (ref 0.0–0.2)

## 2021-08-13 LAB — COMPREHENSIVE METABOLIC PANEL
ALT: 14 U/L (ref 0–44)
AST: 58 U/L — ABNORMAL HIGH (ref 15–41)
Albumin: 2.9 g/dL — ABNORMAL LOW (ref 3.5–5.0)
Alkaline Phosphatase: 67 U/L (ref 38–126)
Anion gap: 8 (ref 5–15)
BUN: 19 mg/dL (ref 8–23)
CO2: 29 mmol/L (ref 22–32)
Calcium: 8.5 mg/dL — ABNORMAL LOW (ref 8.9–10.3)
Chloride: 96 mmol/L — ABNORMAL LOW (ref 98–111)
Creatinine, Ser: 1.14 mg/dL (ref 0.61–1.24)
GFR, Estimated: >60 mL/min (ref >60)
Glucose, Bld: 187 mg/dL — ABNORMAL HIGH (ref 70–99)
Potassium: 3.7 mmol/L (ref 3.5–5.1)
Sodium: 133 mmol/L — ABNORMAL LOW (ref 135–145)
Total Bilirubin: 2.1 mg/dL — ABNORMAL HIGH (ref 0.3–1.2)
Total Protein: 6.6 g/dL (ref 6.5–8.1)

## 2021-08-13 LAB — GLUCOSE, CAPILLARY
Glucose-Capillary: 185 mg/dL — ABNORMAL HIGH (ref 70–99)
Glucose-Capillary: 170 mg/dL — ABNORMAL HIGH (ref 70–99)
Glucose-Capillary: 194 mg/dL — ABNORMAL HIGH (ref 70–99)
Glucose-Capillary: 314 mg/dL — ABNORMAL HIGH (ref 70–99)

## 2021-08-13 LAB — PHOSPHORUS: Phosphorus: 1.9 mg/dL — ABNORMAL LOW (ref 2.5–4.6)

## 2021-08-13 LAB — MAGNESIUM: Magnesium: 2.1 mg/dL (ref 1.7–2.4)

## 2021-08-13 MED ORDER — LIDOCAINE 5 % EX PTCH
2.0000 | MEDICATED_PATCH | CUTANEOUS | Status: DC
  Administered 2021-08-13 – 2021-08-14 (×2): 2 via TRANSDERMAL
  Filled 2021-08-13 (×2): qty 2

## 2021-08-13 MED ORDER — SODIUM PHOSPHATES 45 MMOLE/15ML IV SOLN
20.0000 mmol | Freq: Once | INTRAVENOUS | Status: AC
  Administered 2021-08-13: 20 mmol via INTRAVENOUS
  Filled 2021-08-13: qty 6.67

## 2021-08-13 MED ORDER — MIDODRINE HCL 5 MG PO TABS
10.0000 mg | ORAL_TABLET | Freq: Three times a day (TID) | ORAL | Status: DC
  Administered 2021-08-13 – 2021-08-25 (×34): 10 mg via ORAL
  Filled 2021-08-13 (×33): qty 2

## 2021-08-13 NOTE — TOC Initial Note (Signed)
Transition of Care [facility]) - Initial/Assessment Note    Patient Details  Name: [name] MRN: [id number] Date of Birth: Dec 08, 1961  Transition of [facility]) CM/SW Contact:    Boneta Lucks, RN Phone Number: 08/13/2021, 12:45 PM  Clinical Narrative:    Patient admitted with Hypokalemia.  Patient lives at home with his wife. TOC spoke to [place]. Patient is active with [facility] health. PT has seen twice and continues to recommend [facility]. They are agreeable.  MD has concerns with patient going home. TOC reviewed ALF and family care home prices for [place]. [facility] only accepted 19 and greater years old patient. [name] state they can not afford family care.  She state she normally does better with his medications while she is working. She is aware they need a plan for patient to take medication as scheduled. TOC asked MD for home health orders.          Expected Discharge Plan: [place] Barriers to Discharge: Continued Medical Work up  Patient Goals and CMS Choice Patient states their goals for this hospitalization and ongoing recovery are:: Home with Home health. CMS Medicare.gov Compare Post Acute Care list provided to:: Patient Represenative (must comment) Choice offered to / list presented to : Spouse  Expected Discharge Plan and Services Expected Discharge Plan: [place]      Prior Living Arrangements/Services    Patient language and need for interpreter reviewed:: Yes Do you feel safe going back to the place where you live?: Yes      Need for Family Participation in Patient Care: Yes (Comment) Care giver support system in place?: Yes (comment)   Criminal Activity/Legal Involvement Pertinent to Current Situation/Hospitalization: No - Comment as needed  Activities of Daily Living Home Assistive Devices/Equipment: Eyeglasses, Walker (specify type) ADL Screening (condition at time of admission) Patient's cognitive  ability adequate to safely complete daily activities?: Yes Is the patient deaf or have difficulty hearing?: No Does the patient have difficulty seeing, even when wearing glasses/contacts?: No Does the patient have difficulty concentrating, remembering, or making decisions?: No Patient able to express need for assistance with ADLs?: Yes Does the patient have difficulty dressing or bathing?: No Independently performs ADLs?: Yes (appropriate for developmental age) Does the patient have difficulty walking or climbing stairs?: No Weakness of Legs: None Weakness of Arms/Hands: None  Permission Sought/Granted     Share Information with NAME: [name]     Permission granted to share info w Relationship: Wife     Emotional Assessment     Orientation: : Oriented to Self, Oriented to Place, Oriented to  Time, Oriented to Situation Alcohol / Substance Use: Not Applicable Psych Involvement: No (comment)  Admission diagnosis:  Dehydration [E86.0] Hypokalemia [E87.6] Patient Active Problem List   Diagnosis Date Noted   Hypokalemia 85/88/5027   Acute metabolic encephalopathy 74/11/8785   Chronic kidney disease 07/21/2021   Pure hypercholesterolemia 07/21/2021   GI bleed 07/21/2021   Iron deficiency anemia 07/15/2021   Hyperkalemia 03/17/2021   Elevated troponin 03/17/2021   Back pain 03/17/2021   Hypertensive heart and kidney disease with heart failure and with chronic kidney disease stage III ([place]) 12/17/2020   Chronic diastolic heart failure ([place]) 12/17/2020   CKD stage 3 due to type 2 diabetes mellitus ([place]) 12/17/2020   Hyperlipidemia associated with type 2 diabetes mellitus ([place]) 12/17/2020   Anxiety 12/17/2020   Chronic non-seasonal allergic rhinitis 12/17/2020   Hypotension 12/10/2020  Acute renal failure superimposed on stage 3b chronic kidney disease ([place]) 12/10/2020   Acute renal failure superimposed on stage 3b chronic kidney disease, unspecified acute renal failure type ([place])  12/10/2020   AKI (acute kidney injury) ([place]) 12/09/2020   GERD (gastroesophageal reflux disease) 10/09/2020   Portal hypertension ([place]) 08/26/2020   Pre-transplant evaluation for liver transplant 08/26/2020   Atherosclerotic heart disease of native coronary artery without angina pectoris 07/17/2020   Unspecified cirrhosis of liver ([place]) 07/17/2020   Anasarca 07/17/2020   Heart failure, unspecified ([place]) 07/17/2020   Morbid obesity ([place]) 07/17/2020   Acute on chronic heart failure with preserved ejection fraction (HCC)    Liver failure ([place]) 06/20/2020   Esophageal varices ([place]) 01/09/2020   History of adenomatous polyp of colon 01/09/2020   Elevated AST (SGOT) 08/23/2019   Elevation of level of transaminase and lactic acid dehydrogenase (LDH) 08/23/2019   Diarrhea 07/17/2019   Hypersomnia, persistent 03/12/2019   Macrocytic anemia 01/18/2019   Other pancytopenia ([place]) 01/18/2019   Anemia 01/18/2019   Thrombocytopenia ([place]) 12/30/2018   Polyclonal gammopathy 12/30/2018   S/P total knee replacement, left 03/09/17 11/22/2017   Primary osteoarthritis of left knee 03/09/2017   History of nocturia 03/02/2017   OSA on CPAP 03/02/2016   Hypoxia 03/02/2016   Obstructive sleep apnea (adult) (pediatric) 03/02/2016   Cyst of mediastinum 01/15/2016   S/P CABG x 4 08/29/2015   Obesity, Class III, BMI 40-49.9 (morbid obesity) ([place])    Hyperglycemia    Pain in the chest 08/23/2015   Non-ST elevation MI (NSTEMI) ([place]) 08/23/2015   Diabetes mellitus type 2, uncontrolled ([place]) 08/23/2015   Essential hypertension 08/23/2015   Hyperlipidemia 08/23/2015   Type 2 diabetes mellitus with hypoglycemia with coma ([place]) 08/23/2015   Myocardial infarction ([place]) 08/23/2015   Type 2 diabetes mellitus with hyperglycemia ([place]) 2016   Class 2 obesity 2016   Hyponatremia 2016   Osteoarthritis, knee 05/24/2012   Knee pain 10/29/2011   Knee stiffness 10/29/2011   S/P right knee arthroscopy 10/26/2011    Medial meniscus, posterior horn derangement 09/07/2011   Lateral meniscus derangement 09/07/2011   OA (osteoarthritis) of knee 09/07/2011   Rotator cuff syndrome of left shoulder 08/19/2011   Right knee meniscal tear 08/19/2011   PCP:  [name], MD Pharmacy:   [facility], [place] [address] Phone: [phone] Fax: [place], [place] - [address] AT [address]. [place] [address] Phone: [phone] Fax: [phone]      Readmission Risk Interventions Readmission Risk Prevention Plan 07/22/2021 03/24/2021 12/10/2020  Transportation Screening Complete Complete Complete  HRI or Home Care Consult Complete - Complete  Social Work Consult for [place] Planning/Counseling Complete - Complete  Palliative Care Screening Not Applicable - Not Applicable  Medication Review Press photographer) Complete Complete Complete  PCP or Specialist appointment within 3-5 days of discharge - Complete -  [place] or [place] - Complete -  SW Recovery Care/Counseling Consult - Complete -  Palliative Care Screening - Not Applicable -  [place] - Not Applicable -  Some recent data might be hidden

## 2021-08-13 NOTE — Progress Notes (Signed)
Pt reports that he still is in pain, 8 of 10.  Wife is still at bedside wanting to talk to the MD. MD notified.

## 2021-08-13 NOTE — Progress Notes (Signed)
Pt reports of a 9 of 10 level. PRN tylenol given at 1035 am. Wife is at bedside requesting more pain medication for pt. She stated " the Tylenol is not going to do, He needs something else". MD Dia Crawford notified. MD responded to give Robaxin. Medication administered. Pt situated in bed.

## 2021-08-13 NOTE — Progress Notes (Signed)
Physical Therapy Treatment Patient Details Name: [name] MRN: [id number] DOB: 12-09-1961 Today's Date: 08/13/2021    History of Present Illness [name] is a 60 y.o. male with medical history significant for CAD with prior CABG, cirrhosis, hypertension, dyslipidemia, OSA on CPAP, thrombocytopenia, and type 2 diabetes who presented to the ED after falling at home.  He states that he was using his walker when he tripped over a rug.  He is unsure of losing consciousness after hitting his head, but he did have difficulty getting off the floor due to low back pain.  He denies any other recent events and states that he lives with his wife at home and takes his medications as prescribed.  Wife states that he has been getting more deconditioned lately and has been falling more frequently.    PT Comments    PT unable to come to standing but able to assist in scooting to the head of the bed.   Follow Up Recommendations     SNF   Equipment Recommendations     none  Recommendations for Other Services       Precautions / Restrictions Precautions Precautions: Fall    Mobility  Bed Mobility Overal bed mobility: Needs Assistance Bed Mobility: Rolling;Sidelying to Sit Rolling: Mod assist   Supine to sit: Mod assist     General bed mobility comments: worked on rolling to RT and Lt to improve bed mobility                     General transfer comment: unable today                                     Exercises General Exercises - Lower Extremity Ankle Circles/Pumps: Both;5 reps Quad Sets: Both;5 reps Straight Leg Raises: Both;5 reps        Pertinent Vitals/Pain      Home Living Family/patient expects to be discharged to:: Skilled nursing facility Living Arrangements: Spouse/significant other Available Help at Discharge: Family;Available 24 hours/day Type of Home: House Home Access: Stairs to enter Entrance Stairs-Rails: Left Home  Layout: One level Home Equipment: Walker - 2 wheels;Cane - single point;Cane - quad      Prior Function Level of Independence: Independent with assistive device(s)      Comments: Hydrographic surveyor using RW PRN   PT Goals (current goals can now be found in the care plan section)      Frequency    Min 3X/week      PT Plan                 Continue to work on strengthening and mobility      AM-PAC PT "6 Clicks" Mobility   Outcome Measure                   End of Session Equipment Utilized During Treatment: Gait belt Activity Tolerance: Patient tolerated treatment well;Patient limited by fatigue Patient left: in chair;with call bell/phone within reach Nurse Communication: Mobility status PT Visit Diagnosis: Unsteadiness on feet (R26.81);Other abnormalities of gait and mobility (R26.89);Muscle weakness (generalized) (M62.81)     Time: 1600-1630 PT Time Calculation (min) (ACUTE ONLY): 30 min  Charges:  $Therapeutic Activity: 23-37 mins                     Rayetta Humphrey, PT CLT [id number]  08/13/2021, 4:30  PM

## 2021-08-13 NOTE — Progress Notes (Signed)
PROGRESS NOTE    [name]  IYM:[id number] DOB: [birth year] DOA: 08/11/2021 PCP: [name], MD   Brief Narrative:  [name] is a 60 y.o. WM PMHx CAD with prior CABG, cirrhosis, hypertension, dyslipidemia, OSA on CPAP, thrombocytopenia, and type 2 diabetes   Presented to the ED after falling at home.  He states that he was using his walker when he tripped over a rug.  He is unsure of losing consciousness after hitting his head, but he did have difficulty getting off the floor due to low back pain.  He denies any other recent events and states that he lives with his wife at home and takes his medications as prescribed.  Wife states that he has been getting more deconditioned lately and has been falling more frequently.   ED Course: Stable vital signs noted and patient is afebrile.  Potassium is 2.4, BUN 31, creatinine 1.53, platelets 88,000, sodium 131, and ammonia 42.  EKG with no significant changes identified.  He has been given lactulose enema in the ED as well as some potassium supplementation.   Subjective: 9/1 afebrile overnight A/O x4 laying in the bed comfortably.  States continues to have back pain but has not asked for his Robaxin.  Also states would be willing to go to SNF if it would benefit his conditioning.  Has not talked over with his wife concerning going to SNF.   Assessment & Plan:  Covid vaccination; vaccinated 3/3  Active Problems:   Hypokalemia   Fall/ -PT evaluation with possible need for placement due to frequent falls at home -Fall precautions -No signs of injury on imaging -9/1 PT recommends SNF   Hypotension -8/21 albumin 12.5 g x 1 - 9/1 increase Midodrine 10 mg TID   Muscle spasm -Bilateral oblique muscle spasm LEFT>> RIGHT - Robaxin 500 mg BID -9/1 Lidoderm patch to lower back every 12 hours   AKI-likely prerenal (baseline Cr 1.2) -Appears dehydrated -IVF -Avoid nephrotoxic agents -Strict in and out - Daily  weight Lab Results  Component Value Date   CREATININE 1.14 08/13/2021   CREATININE 1.30 (H) 08/12/2021   CREATININE 1.53 (H) 08/11/2021   CREATININE 1.24 07/28/2021   CREATININE 1.13 07/25/2021  -8/31 improving but not back to baseline   Hypokalemia -Resolved  Hyponatremia - Most likely secondary to his liver failure -9/1 see hypophosphatemia  Hypophosphatemia - Phosphorus goal> 0.5 - 9/1 sodium phosphate 20 mmol   CAD/HTN/HLD -Prior CABG -Continue home medications   Chronic thrombocytopenia -Likely related to liver cirrhosis Results for [name], [name]" (MRN [id number]) as of 08/14/2021 08:41  Ref. Range 07/28/2021 15:10 08/11/2021 05:58 08/12/2021 06:07 08/13/2021 05:30  Platelets Latest Ref Range: 150 - 400 K/uL 74 (L) 88 (L) 83 (L) 73 (L)  -No heparin agents -Stable   Iron deficiency anemia -Continue Fe supplementation -8/31 per patient and wife scheduled for iron infusion on Friday if patient still hospitalized will arrange for iron infusion.   DM type II controlled with hyperglycemia -'Semglee 30 units bedtime -Semglee 40 units daily -8/31 increase NovoLog 10 units qac - Moderate SSI   Liver cirrhosis -Per wife increased somnolence, currently A/O x4 - Monitor for encephalopathy  Elevated ammonia - Lactulose 20 g daily - Rifaximin 550 mg BID Lab Results  Component Value Date   AMMONIA 81 (H) 08/12/2021   AMMONIA 42 (H) 08/11/2021   AMMONIA 27 07/23/2021   AMMONIA 68 (H) 07/21/2021   AMMONIA 56 (H) 03/23/2021     OSA -  CPAP at night  Obese (BMI 37.67 kg/m) -   DVT prophylaxis: SCD Code Status: Full Family Communication: 8/31 wife at bedside for discussion of plan of care all questions answered Status is: Inpatient  Remains inpatient appropriate because:Inpatient level of care appropriate due to severity of illness  Dispo: The patient is from: Home              Anticipated d/c is to: SNF?              Anticipated d/c date is: 3 days               Patient currently is not medically stable to d/c.      Consultants:    Procedures/Significant Events:     I have personally reviewed and interpreted all radiology studies and my findings are as above.  VENTILATOR SETTINGS:    Cultures   Antimicrobials:    Devices    LINES / TUBES:      Continuous Infusions:   Objective: Vitals:   08/13/21 0457 08/13/21 0500 08/13/21 0825 08/13/21 1321  BP: (!) 113/58  113/70 (!) 102/43  Pulse: 84  82 85  Resp: 18   18  Temp: 98 F (36.7 C)   99.1 F (37.3 C)  TempSrc:    Oral  SpO2: 99%   98%  Weight:  119.4 kg    Height:        Intake/Output Summary (Last 24 hours) at 08/13/2021 1648 Last data filed at 08/13/2021 0851 Gross per 24 hour  Intake 720 ml  Output 1300 ml  Net -580 ml    Filed Weights   08/11/21 0403 08/11/21 1600 08/13/21 0500  Weight: 121.6 kg 119.1 kg 119.4 kg    Examination:  General: A/O x4, No acute respiratory distress Eyes: negative scleral hemorrhage, negative anisocoria, negative icterus ENT: Negative Runny nose, negative gingival bleeding, Neck:  Negative scars, masses, torticollis, lymphadenopathy, JVD Lungs: Clear to auscultation bilaterally without wheezes or crackles Cardiovascular: Regular rate and rhythm without murmur gallop or rub normal S1 and S2 Abdomen: OBESE, negative abdominal pain, nondistended, positive soft, bowel sounds, no rebound, no ascites, no appreciable mass Extremities: No significant cyanosis, clubbing, or edema bilateral lower extremities Skin: Negative rashes, lesions, ulcers Psychiatric:  Negative depression, negative anxiety, negative fatigue, negative mania  Central nervous system:  Cranial nerves II through XII intact, tongue/uvula midline, all extremities muscle strength 5/5, sensation intact throughout, expressive aphasia, negative receptive aphasia.  .     Data Reviewed: Care during the described time interval was provided by me .  I have  reviewed this patient's available data, including medical history, events of note, physical examination, and all test results as part of my evaluation.   CBC: Recent Labs  Lab 08/11/21 0558 08/12/21 0607 08/13/21 0530  WBC 5.7 5.1 4.3  NEUTROABS 3.8  --  2.7  HGB 9.3* 8.4* 9.2*  HCT 29.9* 27.5* 30.9*  MCV 94.0 95.5 97.8  PLT 88* 83* 73*    Basic Metabolic Panel: Recent Labs  Lab 08/11/21 0558 08/12/21 0607 08/13/21 0530  NA 131* 135 133*  K 2.4* 3.6 3.7  CL 86* 94* 96*  CO2 33* 33* 29  GLUCOSE 165* 251* 187*  BUN 31* 25* 19  CREATININE 1.53* 1.30* 1.14  CALCIUM 8.4* 8.6* 8.5*  MG  --  1.9 2.1  PHOS  --   --  1.9*    GFR: Estimated Creatinine Clearance: 88.2 mL/min (by C-G formula based on SCr  of 1.14 mg/dL). Liver Function Tests: Recent Labs  Lab 08/11/21 0558 08/13/21 0530  AST 38 58*  ALT 15 14  ALKPHOS 66 67  BILITOT 1.9* 2.1*  PROT 6.8 6.6  ALBUMIN 3.0* 2.9*    No results for input(s): LIPASE, AMYLASE in the last 168 hours. Recent Labs  Lab 08/11/21 0558 08/12/21 0607  AMMONIA 42* 81*    Coagulation Profile: No results for input(s): INR, PROTIME in the last 168 hours. Cardiac Enzymes: No results for input(s): CKTOTAL, CKMB, CKMBINDEX, TROPONINI in the last 168 hours. BNP (last 3 results) No results for input(s): PROBNP in the last 8760 hours. HbA1C: No results for input(s): HGBA1C in the last 72 hours. CBG: Recent Labs  Lab 08/12/21 1610 08/12/21 2020 08/13/21 0721 08/13/21 1128 08/13/21 1608  GLUCAP 251* 262* 185* 170* 194*    Lipid Profile: No results for input(s): CHOL, HDL, LDLCALC, TRIG, CHOLHDL, LDLDIRECT in the last 72 hours. Thyroid Function Tests: No results for input(s): TSH, T4TOTAL, FREET4, T3FREE, THYROIDAB in the last 72 hours. Anemia Panel: No results for input(s): VITAMINB12, FOLATE, FERRITIN, TIBC, IRON, RETICCTPCT in the last 72 hours. Urine analysis:    Component Value Date/Time   COLORURINE YELLOW 08/11/2021  1420   APPEARANCEUR CLEAR 08/11/2021 1420   LABSPEC 1.009 08/11/2021 1420   PHURINE 7.0 08/11/2021 1420   GLUCOSEU NEGATIVE 08/11/2021 1420   HGBUR NEGATIVE 08/11/2021 1420   BILIRUBINUR NEGATIVE 08/11/2021 1420   KETONESUR NEGATIVE 08/11/2021 1420   PROTEINUR NEGATIVE 08/11/2021 1420   UROBILINOGEN 1.0 08/28/2015 1634   NITRITE NEGATIVE 08/11/2021 1420   LEUKOCYTESUR NEGATIVE 08/11/2021 1420   Sepsis Labs: @LABRCNTIP (procalcitonin:4,lacticidven:4)  ) Recent Results (from the past 240 hour(s))  Resp Panel by RT-PCR (Flu A&B, Covid) Nasopharyngeal Swab     Status: None   Collection Time: 08/11/21  7:09 AM   Specimen: Nasopharyngeal Swab; Nasopharyngeal(NP) swabs in vial transport medium  Result Value Ref Range Status   SARS Coronavirus 2 by RT PCR NEGATIVE NEGATIVE Final    Comment: (NOTE) SARS-CoV-2 target nucleic acids are NOT DETECTED.  The SARS-CoV-2 RNA is generally detectable in upper respiratory specimens during the acute phase of infection. The lowest concentration of SARS-CoV-2 viral copies this assay can detect is 138 copies/mL. A negative result does not preclude SARS-Cov-2 infection and should not be used as the sole basis for treatment or other patient management decisions. A negative result may occur with  improper specimen collection/handling, submission of specimen other than nasopharyngeal swab, presence of viral mutation(s) within the areas targeted by this assay, and inadequate number of viral copies(<138 copies/mL). A negative result must be combined with clinical observations, patient history, and epidemiological information. The expected result is Negative.  Fact Sheet for Patients:  EntrepreneurPulse.com.au  Fact Sheet for Healthcare Providers:  IncredibleEmployment.be  This test is no t yet approved or cleared by the [place] FDA and  has been authorized for detection and/or diagnosis of SARS-CoV-2 by FDA  under an Emergency Use Authorization (EUA). This EUA will remain  in effect (meaning this test can be used) for the duration of the COVID-19 declaration under Section 564(b)(1) of the Act, 21 U.S.C.section 360bbb-3(b)(1), unless the authorization is terminated  or revoked sooner.       Influenza A by PCR NEGATIVE NEGATIVE Final   Influenza B by PCR NEGATIVE NEGATIVE Final    Comment: (NOTE) The Xpert Xpress SARS-CoV-2/FLU/RSV plus assay is intended as an aid in the diagnosis of influenza from Nasopharyngeal swab specimens and  should not be used as a sole basis for treatment. Nasal washings and aspirates are unacceptable for Xpert Xpress SARS-CoV-2/FLU/RSV testing.  Fact Sheet for Patients: EntrepreneurPulse.com.au  Fact Sheet for Healthcare Providers: IncredibleEmployment.be  This test is not yet approved or cleared by the [place] FDA and has been authorized for detection and/or diagnosis of SARS-CoV-2 by FDA under an Emergency Use Authorization (EUA). This EUA will remain in effect (meaning this test can be used) for the duration of the COVID-19 declaration under Section 564(b)(1) of the Act, 21 U.S.C. section 360bbb-3(b)(1), unless the authorization is terminated or revoked.  Performed at [facility], [address] [place], [place] [address]          Radiology Studies: No results found.      Scheduled Meds:  aspirin EC  81 mg Oral QHS   ferrous sulfate  325 mg Oral q AM   insulin aspart  0-15 Units Subcutaneous TID WC   insulin aspart  0-5 Units Subcutaneous QHS   insulin aspart  10 Units Subcutaneous TID AC   insulin glargine-yfgn  30 Units Subcutaneous QHS   insulin glargine-yfgn  40 Units Subcutaneous Daily   lactulose  20 g Oral Daily   magnesium oxide  400 mg Oral Daily   midodrine  5 mg Oral TID WC   nadolol  20 mg Oral Daily   pantoprazole  40 mg Oral BID AC   potassium chloride SA  20 mEq Oral BID    potassium chloride  40 mEq Oral BID   pravastatin  20 mg Oral q1800   rifaximin  550 mg Oral BID   venlafaxine XR  37.5 mg Oral Daily   Continuous Infusions:   LOS: 0 days   The patient is critically ill with multiple organ systems failure and requires high complexity decision making for assessment and support, frequent evaluation and titration of therapies, application of advanced monitoring technologies and extensive interpretation of multiple databases. Critical Care Time devoted to patient care services described in this note  Time spent: 40 minutes     Wah Sabic, Geraldo Docker, MD [facility]   If 7PM-7AM, please contact night-coverage 08/13/2021, 4:48 PM

## 2021-08-14 ENCOUNTER — Inpatient Hospital Stay (HOSPITAL_COMMUNITY): Payer: Federal, State, Local not specified - PPO

## 2021-08-14 DIAGNOSIS — I5032 Chronic diastolic (congestive) heart failure: Secondary | ICD-10-CM | POA: Diagnosis not present

## 2021-08-14 DIAGNOSIS — I9589 Other hypotension: Secondary | ICD-10-CM

## 2021-08-14 DIAGNOSIS — E669 Obesity, unspecified: Secondary | ICD-10-CM | POA: Diagnosis present

## 2021-08-14 DIAGNOSIS — Z9989 Dependence on other enabling machines and devices: Secondary | ICD-10-CM

## 2021-08-14 DIAGNOSIS — W19XXXA Unspecified fall, initial encounter: Secondary | ICD-10-CM

## 2021-08-14 DIAGNOSIS — I8500 Esophageal varices without bleeding: Secondary | ICD-10-CM

## 2021-08-14 DIAGNOSIS — D693 Immune thrombocytopenic purpura: Secondary | ICD-10-CM | POA: Diagnosis not present

## 2021-08-14 DIAGNOSIS — K717 Toxic liver disease with fibrosis and cirrhosis of liver: Secondary | ICD-10-CM

## 2021-08-14 DIAGNOSIS — I2510 Atherosclerotic heart disease of native coronary artery without angina pectoris: Secondary | ICD-10-CM

## 2021-08-14 DIAGNOSIS — E785 Hyperlipidemia, unspecified: Secondary | ICD-10-CM | POA: Diagnosis present

## 2021-08-14 DIAGNOSIS — M62838 Other muscle spasm: Secondary | ICD-10-CM

## 2021-08-14 DIAGNOSIS — E7800 Pure hypercholesterolemia, unspecified: Secondary | ICD-10-CM

## 2021-08-14 DIAGNOSIS — R7989 Other specified abnormal findings of blood chemistry: Secondary | ICD-10-CM | POA: Diagnosis present

## 2021-08-14 DIAGNOSIS — I10 Essential (primary) hypertension: Secondary | ICD-10-CM

## 2021-08-14 DIAGNOSIS — E876 Hypokalemia: Secondary | ICD-10-CM | POA: Diagnosis not present

## 2021-08-14 DIAGNOSIS — E861 Hypovolemia: Secondary | ICD-10-CM

## 2021-08-14 DIAGNOSIS — G4733 Obstructive sleep apnea (adult) (pediatric): Secondary | ICD-10-CM

## 2021-08-14 LAB — CBC WITH DIFFERENTIAL/PLATELET
Abs Immature Granulocytes: 0.01 10*3/uL (ref 0.00–0.07)
Basophils Absolute: 0 10*3/uL (ref 0.0–0.1)
Basophils Relative: 1 %
Eosinophils Absolute: 0.1 10*3/uL (ref 0.0–0.5)
Eosinophils Relative: 3 %
HCT: 28.3 % — ABNORMAL LOW (ref 39.0–52.0)
Hemoglobin: 8.4 g/dL — ABNORMAL LOW (ref 13.0–17.0)
Immature Granulocytes: 0 %
Lymphocytes Relative: 20 %
Lymphs Abs: 0.8 10*3/uL (ref 0.7–4.0)
MCH: 29.2 pg (ref 26.0–34.0)
MCHC: 29.7 g/dL — ABNORMAL LOW (ref 30.0–36.0)
MCV: 98.3 fL (ref 80.0–100.0)
Monocytes Absolute: 0.5 10*3/uL (ref 0.1–1.0)
Monocytes Relative: 12 %
Neutro Abs: 2.5 10*3/uL (ref 1.7–7.7)
Neutrophils Relative %: 64 %
Platelets: 72 10*3/uL — ABNORMAL LOW (ref 150–400)
RBC: 2.88 MIL/uL — ABNORMAL LOW (ref 4.22–5.81)
RDW: 19.1 % — ABNORMAL HIGH (ref 11.5–15.5)
WBC: 3.8 10*3/uL — ABNORMAL LOW (ref 4.0–10.5)
nRBC: 0 % (ref 0.0–0.2)

## 2021-08-14 LAB — COMPREHENSIVE METABOLIC PANEL
ALT: 16 U/L (ref 0–44)
AST: 44 U/L — ABNORMAL HIGH (ref 15–41)
Albumin: 2.7 g/dL — ABNORMAL LOW (ref 3.5–5.0)
Alkaline Phosphatase: 64 U/L (ref 38–126)
Anion gap: 7 (ref 5–15)
BUN: 17 mg/dL (ref 8–23)
CO2: 29 mmol/L (ref 22–32)
Calcium: 8.3 mg/dL — ABNORMAL LOW (ref 8.9–10.3)
Chloride: 99 mmol/L (ref 98–111)
Creatinine, Ser: 1.2 mg/dL (ref 0.61–1.24)
GFR, Estimated: >60 mL/min (ref >60)
Glucose, Bld: 284 mg/dL — ABNORMAL HIGH (ref 70–99)
Potassium: 4.1 mmol/L (ref 3.5–5.1)
Sodium: 135 mmol/L (ref 135–145)
Total Bilirubin: 1.6 mg/dL — ABNORMAL HIGH (ref 0.3–1.2)
Total Protein: 6.3 g/dL — ABNORMAL LOW (ref 6.5–8.1)

## 2021-08-14 LAB — MAGNESIUM: Magnesium: 2.2 mg/dL (ref 1.7–2.4)

## 2021-08-14 LAB — GLUCOSE, CAPILLARY
Glucose-Capillary: 237 mg/dL — ABNORMAL HIGH (ref 70–99)
Glucose-Capillary: 339 mg/dL — ABNORMAL HIGH (ref 70–99)
Glucose-Capillary: 263 mg/dL — ABNORMAL HIGH (ref 70–99)
Glucose-Capillary: 252 mg/dL — ABNORMAL HIGH (ref 70–99)

## 2021-08-14 LAB — CBC
HCT: 33.6 % — ABNORMAL LOW (ref 39.0–52.0)
Hemoglobin: 9.9 g/dL — ABNORMAL LOW (ref 13.0–17.0)
MCH: 29.2 pg (ref 26.0–34.0)
MCHC: 29.5 g/dL — ABNORMAL LOW (ref 30.0–36.0)
MCV: 99.1 fL (ref 80.0–100.0)
Platelets: 78 10*3/uL — ABNORMAL LOW (ref 150–400)
RBC: 3.39 MIL/uL — ABNORMAL LOW (ref 4.22–5.81)
RDW: 19.7 % — ABNORMAL HIGH (ref 11.5–15.5)
WBC: 4.1 10*3/uL (ref 4.0–10.5)
nRBC: 0 % (ref 0.0–0.2)

## 2021-08-14 LAB — OCCULT BLOOD X 1 CARD TO LAB, STOOL: Fecal Occult Bld: POSITIVE — AB

## 2021-08-14 LAB — PHOSPHORUS: Phosphorus: 2.8 mg/dL (ref 2.5–4.6)

## 2021-08-14 LAB — AMMONIA: Ammonia: 49 umol/L — ABNORMAL HIGH (ref 9–35)

## 2021-08-14 MED ORDER — LIDOCAINE 5 % EX PTCH
4.0000 | MEDICATED_PATCH | CUTANEOUS | Status: DC
  Administered 2021-08-15 – 2021-08-24 (×10): 4 via TRANSDERMAL
  Filled 2021-08-14 (×10): qty 4

## 2021-08-14 MED ORDER — INSULIN ASPART 100 UNIT/ML IJ SOLN
15.0000 [IU] | Freq: Three times a day (TID) | INTRAMUSCULAR | Status: DC
  Administered 2021-08-15 – 2021-08-21 (×13): 15 [IU] via SUBCUTANEOUS

## 2021-08-14 NOTE — Progress Notes (Signed)
Physical Therapy Treatment Patient Details Name: [name] MRN: [id number] DOB: 1961-08-03 Today's Date: 08/14/2021    History of Present Illness [name] is a 60 y.o. male with medical history significant for CAD with prior CABG, cirrhosis, hypertension, dyslipidemia, OSA on CPAP, thrombocytopenia, and type 2 diabetes who presented to the ED after falling at home.  He states that he was using his walker when he tripped over a rug.  He is unsure of losing consciousness after hitting his head, but he did have difficulty getting off the floor due to low back pain.  He denies any other recent events and states that he lives with his wife at home and takes his medications as prescribed.  Wife states that he has been getting more deconditioned lately and has been falling more frequently.    PT Comments    Patient presents up in chair (assisted by nursing staff) and agreeable for therapy.  Patient requires much time, frequent rest breaks and repeated attempts to complete sit to stands, very unsteady on feet and limited to a few steps at bedside before having to sit due to c/o fatigue and generalized weakness.  Patient put back to bed and required Min assist to move legs back onto bed.  Patient will benefit from continued physical therapy in hospital and recommended venue below to increase strength, balance, endurance for safe ADLs and gait.     Follow Up Recommendations  SNF     Equipment Recommendations  None recommended by PT    Recommendations for Other Services       Precautions / Restrictions Precautions Precautions: Fall Restrictions Weight Bearing Restrictions: No    Mobility  Bed Mobility Overal bed mobility: Needs Assistance Bed Mobility: Sit to Sidelying;Rolling Rolling: Min guard       Sit to sidelying: Min assist General bed mobility comments: increased time, labored movement    Transfers Overall transfer level: Needs assistance Equipment used:  Rolling walker (2 wheeled) Transfers: Stand Pivot Transfers;Sit to/from Stand Sit to Stand: Min assist;Mod assist Stand pivot transfers: Min assist;Mod assist       General transfer comment: required frequent rest breaks and multiple attempts before able to stand  Ambulation/Gait Ambulation/Gait assistance: Mod assist Gait Distance (Feet): 5 Feet Assistive device: Rolling walker (2 wheeled) Gait Pattern/deviations: Decreased step length - right;Decreased step length - left;Decreased stance time - left Gait velocity: decreased   General Gait Details: limited to a few slow labored side steps and stepping backwards before having to sit due to fatigue   Stairs             Wheelchair Mobility    Modified Rankin (Stroke Patients Only)       Balance Overall balance assessment: Needs assistance Sitting-balance support: Feet supported;No upper extremity supported Sitting balance-Leahy Scale: Fair Sitting balance - Comments: fair/good seated at EOB   Standing balance support: During functional activity;Bilateral upper extremity supported Standing balance-Leahy Scale: Poor Standing balance comment: fair/poor using RW                            Cognition Arousal/Alertness: Awake/alert Behavior During Therapy: WFL for tasks assessed/performed Overall Cognitive Status: Within Functional Limits for tasks assessed                                        Exercises General Exercises -  Lower Extremity Long Arc Quad: Seated;AROM;Strengthening;Both;10 reps Hip Flexion/Marching: Seated;AROM;Strengthening;Both;10 reps Toe Raises: Seated;AROM;Strengthening;Both;10 reps Heel Raises: Seated;AROM;Strengthening;Both;10 reps    General Comments        Pertinent Vitals/Pain Pain Assessment: Faces Faces Pain Scale: Hurts little more Pain Location: low back Pain Descriptors / Indicators: Sore;Aching;Grimacing Pain Intervention(s): Limited activity within  patient's tolerance;Monitored during session;Repositioned    Home Living                      Prior Function            PT Goals (current goals can now be found in the care plan section) Acute Rehab PT Goals Patient Stated Goal: Return home PT Goal Formulation: With patient Time For Goal Achievement: 08/14/21 Potential to Achieve Goals: Fair Progress towards PT goals: Progressing toward goals    Frequency    Min 3X/week      PT Plan Discharge plan needs to be updated    Co-evaluation              AM-PAC PT "6 Clicks" Mobility   Outcome Measure  Help needed turning from your back to your side while in a flat bed without using bedrails?: A Little Help needed moving from lying on your back to sitting on the side of a flat bed without using bedrails?: A Lot Help needed moving to and from a bed to a chair (including a wheelchair)?: A Lot Help needed standing up from a chair using your arms (e.g., wheelchair or bedside chair)?: A Lot Help needed to walk in hospital room?: A Lot Help needed climbing 3-5 steps with a railing? : Total 6 Click Score: 12    End of Session   Activity Tolerance: Patient tolerated treatment well;Patient limited by fatigue Patient left: in bed;with call bell/phone within reach Nurse Communication: Mobility status PT Visit Diagnosis: Unsteadiness on feet (R26.81);Other abnormalities of gait and mobility (R26.89);Muscle weakness (generalized) (M62.81)     Time: 8921-1941 PT Time Calculation (min) (ACUTE ONLY): 35 min  Charges:  $Therapeutic Exercise: 8-22 mins $Therapeutic Activity: 8-22 mins                     2:16 PM, 08/14/21 Lonell Grandchild, MPT Physical Therapist with [facility] 336 [phone] office [phone] mobile phone

## 2021-08-14 NOTE — TOC Progression Note (Signed)
Transition of Care [facility]) - Progression Note    Patient Details  Name: [name] MRN: [id number] Date of Birth: 01/18/1961  Transition of Care [facility]) CM/SW Contact  Joaquin Courts, RN Phone Number: 08/14/2021, 2:26 PM  Clinical Narrative:  CM spoke with patient and spouse regarding updated recommendation for SNF for short term rehab.  Spouse and patient initially hesitant about pursuing SNF, CM provided family and patient with time to consider their options.  Ultimately spouse and patient both felt patient is deconditioned and will need to improve mobility prior to return home.   [facility]ist [facility] for snf [facility], patient previously went for rehab in January 2022.  Facility however has no bed to offer, this information was communicated to spouse.  With permission CM faxed out FL2 to all area SNF.  CM also discussed the authorization process with spouse.  TOC will continue to follow.       Expected Discharge Plan: Skilled Nursing Facility Barriers to Discharge: No SNF bed, Insurance Authorization  Expected Discharge Plan and Services Expected Discharge Plan: [place]                                               Social Determinants of Health (SDOH) Interventions    Readmission Risk Interventions Readmission Risk Prevention Plan 07/22/2021 03/24/2021 12/10/2020  Transportation Screening Complete Complete Complete  HRI or Home Care Consult Complete - Complete  Social Work Consult for [place] Planning/Counseling Complete - Complete  Palliative Care Screening Not Applicable - Not Applicable  Medication Review Press photographer) Complete Complete Complete  PCP or Specialist appointment within 3-5 days of discharge - Complete -  [place] or [place] - Complete -  SW Recovery Care/Counseling Consult - Complete -  Palliative Care Screening - Not Applicable -  [place] - Not Applicable -  Some recent data might be  hidden

## 2021-08-14 NOTE — Progress Notes (Signed)
Pts wife updated several times via phone call. This nurse explained the situation of the pt and his care. Pt wife has some concerns that was expressed and MD notified.

## 2021-08-14 NOTE — Progress Notes (Signed)
RN just reported to RT that patient came off CPAP shortly after he was put on last night so therefore he was noncompliant with wearing machine. Unit still in room.

## 2021-08-14 NOTE — Progress Notes (Addendum)
PROGRESS NOTE    [name]  KPT:[id number] DOB: 1961/06/03 DOA: 08/11/2021 PCP: [name], MD   Brief Narrative:  [name] is a 60 y.o. WM PMHx CAD with prior CABG, cirrhosis, hypertension, dyslipidemia, OSA on CPAP, thrombocytopenia, and type 2 diabetes   Presented to the ED after falling at home.  He states that he was using his walker when he tripped over a rug.  He is unsure of losing consciousness after hitting his head, but he did have difficulty getting off the floor due to low back pain.  He denies any other recent events and states that he lives with his wife at home and takes his medications as prescribed.  Wife states that he has been getting more deconditioned lately and has been falling more frequently.   ED Course: Stable vital signs noted and patient is afebrile.  Potassium is 2.4, BUN 31, creatinine 1.53, platelets 88,000, sodium 131, and ammonia 42.  EKG with no significant changes identified.  He has been given lactulose enema in the ED as well as some potassium supplementation.   Subjective: 9/2 A/O x4, patient said set this chair most of the day.  States after speaking with his wife they have decided he will go to SNF    Assessment & Plan:  Covid vaccination; vaccinated 3/3  Active Problems:   Essential hypertension   OSA on CPAP   Esophageal varices (HCC)   Liver failure (HCC)   Unspecified cirrhosis of liver (HCC)   Type 2 diabetes mellitus with hyperglycemia (HCC)   Hypotension   Chronic diastolic heart failure (HCC)   Hyperlipidemia associated with type 2 diabetes mellitus (HCC)   Back pain   Anemia   Obstructive sleep apnea (adult) (pediatric)   Hypokalemia   Muscle spasm   CAD (coronary artery disease)   HLD (hyperlipidemia)   Chronic idiopathic thrombocytopenia (HCC)   Increased ammonia level   Obese   Fall at home, initial encounter   Hypotension due to hypovolemia   Fall/ -PT evaluation with possible need for  placement due to frequent falls at home -Fall precautions -No signs of injury on imaging -9/1 PT recommends SNF   Hypotension -8/21 albumin 12.5 g x 1 - 9/1 increase Midodrine 10 mg TID  Muscle spasm -Bilateral oblique muscle spasm LEFT>> RIGHT - Robaxin 500 mg BID -9/1 Lidoderm patch to lower back every 12 hours   AKI-likely prerenal (baseline Cr 1.2) -Appears dehydrated -IVF -Avoid nephrotoxic agents -Strict in and out - Daily weight Lab Results  Component Value Date   CREATININE 1.20 08/14/2021   CREATININE 1.14 08/13/2021   CREATININE 1.30 (H) 08/12/2021   CREATININE 1.53 (H) 08/11/2021   CREATININE 1.24 07/28/2021  - 9/2 resolved    Hypokalemia -Resolved  Hyponatremia - Most likely secondary to his liver failure -9/1 see hypophosphatemia  Hypophosphatemia - Phosphorus goal> 0.5 - 9/1 sodium phosphate 20 mmol   CAD/HTN/HLD -Prior CABG -Continue home medications   Chronic thrombocytopenia -Likely related to liver cirrhosis Results for DONAVIN, AUDINO" (MRN [id number]) as of 08/14/2021 08:41  Ref. Range 07/28/2021 15:10 08/11/2021 05:58 08/12/2021 06:07 08/13/2021 05:30  Platelets Latest Ref Range: 150 - 400 K/uL 74 (L) 88 (L) 83 (L) 73 (L)  -No heparin agents -Stable   Iron deficiency anemia -Continue Fe supplementation -8/31 per patient and wife scheduled for iron infusion on Friday if patient still hospitalized will arrange for iron infusion. Lab Results  Component Value Date   HGB 9.9 (L)  08/14/2021   HGB 8.4 (L) 08/14/2021   HGB 9.2 (L) 08/13/2021   HGB 8.4 (L) 08/12/2021   HGB 9.3 (L) 08/11/2021     DM type II controlled with hyperglycemia -Semglee 30 units bedtime -9/2 Semglee 40 units daily - 9/2 increase NovoLog 15 units qac  - Moderate SSI   Liver cirrhosis -Per wife increased somnolence, currently A/O x4 - Monitor for encephalopathy  Increased Ammonia Level - Lactulose 20 g daily - Rifaximin 550 mg BID Lab Results   Component Value Date   AMMONIA 49 (H) 08/14/2021   AMMONIA 81 (H) 08/12/2021   AMMONIA 42 (H) 08/11/2021   AMMONIA 27 07/23/2021   AMMONIA 68 (H) 07/21/2021     OSA -CPAP at night  Obese (BMI 37.67 kg/m) -   DVT prophylaxis: SCD Code Status: Full Family Communication: 9/2 wife at bedside for discussion of plan of care all questions answered Status is: Inpatient  Remains inpatient appropriate because:Inpatient level of care appropriate due to severity of illness  Dispo: The patient is from: Home              Anticipated d/c is to: SNF?              Anticipated d/c date is: 3 days              Patient currently is not medically stable to d/c.      Consultants:    Procedures/Significant Events:     I have personally reviewed and interpreted all radiology studies and my findings are as above.  VENTILATOR SETTINGS:    Cultures   Antimicrobials:    Devices    LINES / TUBES:      Continuous Infusions:   Objective: Vitals:   08/13/21 2012 08/14/21 0525 08/14/21 0700 08/14/21 1425  BP: 112/66 110/77  126/68  Pulse: 84 85  78  Resp: 20 20  20   Temp: 98.1 F (36.7 C) 98.5 F (36.9 C)  98.4 F (36.9 C)  TempSrc:  Oral  Oral  SpO2: 99% 98%  100%  Weight:   121.7 kg   Height:        Intake/Output Summary (Last 24 hours) at 08/14/2021 2031 Last data filed at 08/14/2021 1817 Gross per 24 hour  Intake 259.38 ml  Output 700 ml  Net -440.62 ml   Filed Weights   08/11/21 1600 08/13/21 0500 08/14/21 0700  Weight: 119.1 kg 119.4 kg 121.7 kg    Examination:  General: A/O x4, No acute respiratory distress Eyes: negative scleral hemorrhage, negative anisocoria, negative icterus ENT: Negative Runny nose, negative gingival bleeding, Neck:  Negative scars, masses, torticollis, lymphadenopathy, JVD Lungs: Clear to auscultation bilaterally without wheezes or crackles Cardiovascular: Regular rate and rhythm without murmur gallop or rub normal S1 and  S2 Abdomen: OBESE, negative abdominal pain, nondistended, positive soft, bowel sounds, no rebound, no ascites, no appreciable mass Extremities: No significant cyanosis, clubbing, or edema bilateral lower extremities Skin: Negative rashes, lesions, ulcers Psychiatric:  Negative depression, negative anxiety, negative fatigue, negative mania  Central nervous system:  Cranial nerves II through XII intact, tongue/uvula midline, all extremities muscle strength 5/5, sensation intact throughout, expressive aphasia, negative receptive aphasia.  .     Data Reviewed: Care during the described time interval was provided by me .  I have reviewed this patient's available data, including medical history, events of note, physical examination, and all test results as part of my evaluation.   CBC: Recent Labs  Lab 08/11/21  3300 08/12/21 0607 08/13/21 0530 08/14/21 0603 08/14/21 1258  WBC 5.7 5.1 4.3 3.8* 4.1  NEUTROABS 3.8  --  2.7 2.5  --   HGB 9.3* 8.4* 9.2* 8.4* 9.9*  HCT 29.9* 27.5* 30.9* 28.3* 33.6*  MCV 94.0 95.5 97.8 98.3 99.1  PLT 88* 83* 73* 72* 78*   Basic Metabolic Panel: Recent Labs  Lab 08/11/21 0558 08/12/21 0607 08/13/21 0530 08/14/21 0603  NA 131* 135 133* 135  K 2.4* 3.6 3.7 4.1  CL 86* 94* 96* 99  CO2 33* 33* 29 29  GLUCOSE 165* 251* 187* 284*  BUN 31* 25* 19 17  CREATININE 1.53* 1.30* 1.14 1.20  CALCIUM 8.4* 8.6* 8.5* 8.3*  MG  --  1.9 2.1 2.2  PHOS  --   --  1.9* 2.8   GFR: Estimated Creatinine Clearance: 84.6 mL/min (by C-G formula based on SCr of 1.2 mg/dL). Liver Function Tests: Recent Labs  Lab 08/11/21 0558 08/13/21 0530 08/14/21 0603  AST 38 58* 44*  ALT 15 14 16   ALKPHOS 66 67 64  BILITOT 1.9* 2.1* 1.6*  PROT 6.8 6.6 6.3*  ALBUMIN 3.0* 2.9* 2.7*   No results for input(s): LIPASE, AMYLASE in the last 168 hours. Recent Labs  Lab 08/11/21 0558 08/12/21 0607 08/14/21 0924  AMMONIA 42* 81* 49*   Coagulation Profile: No results for input(s):  INR, PROTIME in the last 168 hours. Cardiac Enzymes: No results for input(s): CKTOTAL, CKMB, CKMBINDEX, TROPONINI in the last 168 hours. BNP (last 3 results) No results for input(s): PROBNP in the last 8760 hours. HbA1C: No results for input(s): HGBA1C in the last 72 hours. CBG: Recent Labs  Lab 08/13/21 1608 08/13/21 2210 08/14/21 0738 08/14/21 1208 08/14/21 1624  GLUCAP 194* 314* 237* 339* 263*   Lipid Profile: No results for input(s): CHOL, HDL, LDLCALC, TRIG, CHOLHDL, LDLDIRECT in the last 72 hours. Thyroid Function Tests: No results for input(s): TSH, T4TOTAL, FREET4, T3FREE, THYROIDAB in the last 72 hours. Anemia Panel: No results for input(s): VITAMINB12, FOLATE, FERRITIN, TIBC, IRON, RETICCTPCT in the last 72 hours. Urine analysis:    Component Value Date/Time   COLORURINE YELLOW 08/11/2021 1420   APPEARANCEUR CLEAR 08/11/2021 1420   LABSPEC 1.009 08/11/2021 1420   PHURINE 7.0 08/11/2021 1420   GLUCOSEU NEGATIVE 08/11/2021 1420   HGBUR NEGATIVE 08/11/2021 1420   BILIRUBINUR NEGATIVE 08/11/2021 1420   KETONESUR NEGATIVE 08/11/2021 1420   PROTEINUR NEGATIVE 08/11/2021 1420   UROBILINOGEN 1.0 08/28/2015 1634   NITRITE NEGATIVE 08/11/2021 1420   LEUKOCYTESUR NEGATIVE 08/11/2021 1420   Sepsis Labs: @LABRCNTIP (procalcitonin:4,lacticidven:4)  ) Recent Results (from the past 240 hour(s))  Resp Panel by RT-PCR (Flu A&B, Covid) Nasopharyngeal Swab     Status: None   Collection Time: 08/11/21  7:09 AM   Specimen: Nasopharyngeal Swab; Nasopharyngeal(NP) swabs in vial transport medium  Result Value Ref Range Status   SARS Coronavirus 2 by RT PCR NEGATIVE NEGATIVE Final    Comment: (NOTE) SARS-CoV-2 target nucleic acids are NOT DETECTED.  The SARS-CoV-2 RNA is generally detectable in upper respiratory specimens during the acute phase of infection. The lowest concentration of SARS-CoV-2 viral copies this assay can detect is 138 copies/mL. A negative result does not  preclude SARS-Cov-2 infection and should not be used as the sole basis for treatment or other patient management decisions. A negative result may occur with  improper specimen collection/handling, submission of specimen other than nasopharyngeal swab, presence of viral mutation(s) within the areas targeted by this assay, and  inadequate number of viral copies(<138 copies/mL). A negative result must be combined with clinical observations, patient history, and epidemiological information. The expected result is Negative.  Fact Sheet for Patients:  EntrepreneurPulse.com.au  Fact Sheet for Healthcare Providers:  IncredibleEmployment.be  This test is no t yet approved or cleared by the [place] FDA and  has been authorized for detection and/or diagnosis of SARS-CoV-2 by FDA under an Emergency Use Authorization (EUA). This EUA will remain  in effect (meaning this test can be used) for the duration of the COVID-19 declaration under Section 564(b)(1) of the Act, 21 U.S.C.section 360bbb-3(b)(1), unless the authorization is terminated  or revoked sooner.       Influenza A by PCR NEGATIVE NEGATIVE Final   Influenza B by PCR NEGATIVE NEGATIVE Final    Comment: (NOTE) The Xpert Xpress SARS-CoV-2/FLU/RSV plus assay is intended as an aid in the diagnosis of influenza from Nasopharyngeal swab specimens and should not be used as a sole basis for treatment. Nasal washings and aspirates are unacceptable for Xpert Xpress SARS-CoV-2/FLU/RSV testing.  Fact Sheet for Patients: EntrepreneurPulse.com.au  Fact Sheet for Healthcare Providers: IncredibleEmployment.be  This test is not yet approved or cleared by the [place] FDA and has been authorized for detection and/or diagnosis of SARS-CoV-2 by FDA under an Emergency Use Authorization (EUA). This EUA will remain in effect (meaning this test can be used) for the  duration of the COVID-19 declaration under Section 564(b)(1) of the Act, 21 U.S.C. section 360bbb-3(b)(1), unless the authorization is terminated or revoked.  Performed at [facility], [address] [place], [place] [address]          Radiology Studies: No results found.      Scheduled Meds:  aspirin EC  81 mg Oral QHS   ferrous sulfate  325 mg Oral q AM   insulin aspart  0-15 Units Subcutaneous TID WC   insulin aspart  0-5 Units Subcutaneous QHS   [START ON 08/15/2021] insulin aspart  15 Units Subcutaneous TID AC   insulin glargine-yfgn  30 Units Subcutaneous QHS   insulin glargine-yfgn  40 Units Subcutaneous Daily   lactulose  20 g Oral Daily   [START ON 08/15/2021] lidocaine  4 patch Transdermal Q24H   magnesium oxide  400 mg Oral Daily   midodrine  10 mg Oral TID WC   nadolol  20 mg Oral Daily   pantoprazole  40 mg Oral BID AC   potassium chloride SA  20 mEq Oral BID   pravastatin  20 mg Oral q1800   rifaximin  550 mg Oral BID   venlafaxine XR  37.5 mg Oral Daily   Continuous Infusions:   LOS: 0 days   The patient is critically ill with multiple organ systems failure and requires high complexity decision making for assessment and support, frequent evaluation and titration of therapies, application of advanced monitoring technologies and extensive interpretation of multiple databases. Critical Care Time devoted to patient care services described in this note  Time spent: 40 minutes     Forever Arechiga, Geraldo Docker, MD [facility]   If 7PM-7AM, please contact night-coverage 08/14/2021, 8:31 PM

## 2021-08-15 ENCOUNTER — Encounter (HOSPITAL_COMMUNITY): Payer: Self-pay | Admitting: Internal Medicine

## 2021-08-15 DIAGNOSIS — D509 Iron deficiency anemia, unspecified: Secondary | ICD-10-CM | POA: Diagnosis present

## 2021-08-15 DIAGNOSIS — E785 Hyperlipidemia, unspecified: Secondary | ICD-10-CM | POA: Diagnosis not present

## 2021-08-15 DIAGNOSIS — I130 Hypertensive heart and chronic kidney disease with heart failure and stage 1 through stage 4 chronic kidney disease, or unspecified chronic kidney disease: Secondary | ICD-10-CM | POA: Diagnosis present

## 2021-08-15 DIAGNOSIS — E876 Hypokalemia: Secondary | ICD-10-CM | POA: Diagnosis not present

## 2021-08-15 DIAGNOSIS — Y92009 Unspecified place in unspecified non-institutional (private) residence as the place of occurrence of the external cause: Secondary | ICD-10-CM | POA: Diagnosis not present

## 2021-08-15 DIAGNOSIS — K7031 Alcoholic cirrhosis of liver with ascites: Secondary | ICD-10-CM

## 2021-08-15 DIAGNOSIS — E1122 Type 2 diabetes mellitus with diabetic chronic kidney disease: Secondary | ICD-10-CM | POA: Diagnosis present

## 2021-08-15 DIAGNOSIS — Z20822 Contact with and (suspected) exposure to covid-19: Secondary | ICD-10-CM | POA: Diagnosis present

## 2021-08-15 DIAGNOSIS — W19XXXD Unspecified fall, subsequent encounter: Secondary | ICD-10-CM | POA: Diagnosis not present

## 2021-08-15 DIAGNOSIS — R7989 Other specified abnormal findings of blood chemistry: Secondary | ICD-10-CM | POA: Diagnosis not present

## 2021-08-15 DIAGNOSIS — N1832 Chronic kidney disease, stage 3b: Secondary | ICD-10-CM | POA: Diagnosis present

## 2021-08-15 DIAGNOSIS — D508 Other iron deficiency anemias: Secondary | ICD-10-CM | POA: Diagnosis not present

## 2021-08-15 DIAGNOSIS — M25562 Pain in left knee: Secondary | ICD-10-CM | POA: Diagnosis not present

## 2021-08-15 DIAGNOSIS — E1169 Type 2 diabetes mellitus with other specified complication: Secondary | ICD-10-CM | POA: Diagnosis present

## 2021-08-15 DIAGNOSIS — Z6838 Body mass index (BMI) 38.0-38.9, adult: Secondary | ICD-10-CM | POA: Diagnosis not present

## 2021-08-15 DIAGNOSIS — E861 Hypovolemia: Secondary | ICD-10-CM | POA: Diagnosis not present

## 2021-08-15 DIAGNOSIS — D61818 Other pancytopenia: Secondary | ICD-10-CM | POA: Diagnosis present

## 2021-08-15 DIAGNOSIS — K7290 Hepatic failure, unspecified without coma: Secondary | ICD-10-CM | POA: Diagnosis present

## 2021-08-15 DIAGNOSIS — E870 Hyperosmolality and hypernatremia: Secondary | ICD-10-CM | POA: Diagnosis not present

## 2021-08-15 DIAGNOSIS — E860 Dehydration: Secondary | ICD-10-CM | POA: Diagnosis not present

## 2021-08-15 DIAGNOSIS — I2510 Atherosclerotic heart disease of native coronary artery without angina pectoris: Secondary | ICD-10-CM | POA: Diagnosis not present

## 2021-08-15 DIAGNOSIS — G4733 Obstructive sleep apnea (adult) (pediatric): Secondary | ICD-10-CM | POA: Diagnosis not present

## 2021-08-15 DIAGNOSIS — W1809XA Striking against other object with subsequent fall, initial encounter: Secondary | ICD-10-CM | POA: Diagnosis present

## 2021-08-15 DIAGNOSIS — W19XXXA Unspecified fall, initial encounter: Secondary | ICD-10-CM | POA: Diagnosis not present

## 2021-08-15 DIAGNOSIS — E7800 Pure hypercholesterolemia, unspecified: Secondary | ICD-10-CM | POA: Diagnosis not present

## 2021-08-15 DIAGNOSIS — G8911 Acute pain due to trauma: Secondary | ICD-10-CM

## 2021-08-15 DIAGNOSIS — E1365 Other specified diabetes mellitus with hyperglycemia: Secondary | ICD-10-CM | POA: Diagnosis not present

## 2021-08-15 DIAGNOSIS — M545 Low back pain, unspecified: Secondary | ICD-10-CM

## 2021-08-15 DIAGNOSIS — K7460 Unspecified cirrhosis of liver: Secondary | ICD-10-CM | POA: Diagnosis not present

## 2021-08-15 DIAGNOSIS — D693 Immune thrombocytopenic purpura: Secondary | ICD-10-CM | POA: Diagnosis not present

## 2021-08-15 DIAGNOSIS — I5032 Chronic diastolic (congestive) heart failure: Secondary | ICD-10-CM | POA: Diagnosis not present

## 2021-08-15 DIAGNOSIS — I10 Essential (primary) hypertension: Secondary | ICD-10-CM | POA: Diagnosis not present

## 2021-08-15 DIAGNOSIS — N179 Acute kidney failure, unspecified: Secondary | ICD-10-CM | POA: Diagnosis present

## 2021-08-15 DIAGNOSIS — K7210 Chronic hepatic failure without coma: Secondary | ICD-10-CM

## 2021-08-15 DIAGNOSIS — Y9301 Activity, walking, marching and hiking: Secondary | ICD-10-CM | POA: Diagnosis present

## 2021-08-15 DIAGNOSIS — E1165 Type 2 diabetes mellitus with hyperglycemia: Secondary | ICD-10-CM | POA: Diagnosis not present

## 2021-08-15 DIAGNOSIS — M6281 Muscle weakness (generalized): Secondary | ICD-10-CM | POA: Diagnosis not present

## 2021-08-15 DIAGNOSIS — I9589 Other hypotension: Secondary | ICD-10-CM | POA: Diagnosis present

## 2021-08-15 DIAGNOSIS — E669 Obesity, unspecified: Secondary | ICD-10-CM | POA: Diagnosis not present

## 2021-08-15 LAB — PROTIME-INR
INR: 1.2 (ref 0.8–1.2)
Prothrombin Time: 15.4 seconds — ABNORMAL HIGH (ref 11.4–15.2)

## 2021-08-15 LAB — COMPREHENSIVE METABOLIC PANEL
ALT: 14 U/L (ref 0–44)
AST: 37 U/L (ref 15–41)
Albumin: 2.8 g/dL — ABNORMAL LOW (ref 3.5–5.0)
Alkaline Phosphatase: 65 U/L (ref 38–126)
Anion gap: 9 (ref 5–15)
BUN: 15 mg/dL (ref 8–23)
CO2: 27 mmol/L (ref 22–32)
Calcium: 8.7 mg/dL — ABNORMAL LOW (ref 8.9–10.3)
Chloride: 102 mmol/L (ref 98–111)
Creatinine, Ser: 1.12 mg/dL (ref 0.61–1.24)
GFR, Estimated: >60 mL/min (ref >60)
Glucose, Bld: 154 mg/dL — ABNORMAL HIGH (ref 70–99)
Potassium: 3.9 mmol/L (ref 3.5–5.1)
Sodium: 138 mmol/L (ref 135–145)
Total Bilirubin: 1.5 mg/dL — ABNORMAL HIGH (ref 0.3–1.2)
Total Protein: 6.6 g/dL (ref 6.5–8.1)

## 2021-08-15 LAB — CBC WITH DIFFERENTIAL/PLATELET
Abs Immature Granulocytes: 0.02 10*3/uL (ref 0.00–0.07)
Basophils Absolute: 0 10*3/uL (ref 0.0–0.1)
Basophils Relative: 1 %
Eosinophils Absolute: 0.1 10*3/uL (ref 0.0–0.5)
Eosinophils Relative: 3 %
HCT: 29.4 % — ABNORMAL LOW (ref 39.0–52.0)
Hemoglobin: 8.6 g/dL — ABNORMAL LOW (ref 13.0–17.0)
Immature Granulocytes: 1 %
Lymphocytes Relative: 19 %
Lymphs Abs: 0.7 10*3/uL (ref 0.7–4.0)
MCH: 29.2 pg (ref 26.0–34.0)
MCHC: 29.3 g/dL — ABNORMAL LOW (ref 30.0–36.0)
MCV: 99.7 fL (ref 80.0–100.0)
Monocytes Absolute: 0.5 10*3/uL (ref 0.1–1.0)
Monocytes Relative: 13 %
Neutro Abs: 2.4 10*3/uL (ref 1.7–7.7)
Neutrophils Relative %: 63 %
Platelets: 90 10*3/uL — ABNORMAL LOW (ref 150–400)
RBC: 2.95 MIL/uL — ABNORMAL LOW (ref 4.22–5.81)
RDW: 19.4 % — ABNORMAL HIGH (ref 11.5–15.5)
WBC: 3.8 10*3/uL — ABNORMAL LOW (ref 4.0–10.5)
nRBC: 0 % (ref 0.0–0.2)

## 2021-08-15 LAB — MAGNESIUM: Magnesium: 2.2 mg/dL (ref 1.7–2.4)

## 2021-08-15 LAB — PHOSPHORUS: Phosphorus: 2.5 mg/dL (ref 2.5–4.6)

## 2021-08-15 LAB — GLUCOSE, CAPILLARY
Glucose-Capillary: 147 mg/dL — ABNORMAL HIGH (ref 70–99)
Glucose-Capillary: 229 mg/dL — ABNORMAL HIGH (ref 70–99)
Glucose-Capillary: 200 mg/dL — ABNORMAL HIGH (ref 70–99)
Glucose-Capillary: 186 mg/dL — ABNORMAL HIGH (ref 70–99)

## 2021-08-15 LAB — AMMONIA: Ammonia: 46 umol/L — ABNORMAL HIGH (ref 9–35)

## 2021-08-15 NOTE — Progress Notes (Signed)
PROGRESS NOTE    [name]  ZOX:[id number] DOB: March 06, 1961 DOA: 08/11/2021 PCP: [name], MD   Brief Narrative:  [name] is a 60 y.o. WM PMHx CAD with prior CABG, cirrhosis, hypertension, dyslipidemia, OSA on CPAP, thrombocytopenia, and type 2 diabetes   Presented to the ED after falling at home.  He states that he was using his walker when he tripped over a rug.  He is unsure of losing consciousness after hitting his head, but he did have difficulty getting off the floor due to low back pain.  He denies any other recent events and states that he lives with his wife at home and takes his medications as prescribed.  Wife states that he has been getting more deconditioned lately and has been falling more frequently.   ED Course: Stable vital signs noted and patient is afebrile.  Potassium is 2.4, BUN 31, creatinine 1.53, platelets 88,000, sodium 131, and ammonia 42.  EKG with no significant changes identified.  He has been given lactulose enema in the ED as well as some potassium supplementation.   Subjective: 9/3 afebrile overnight A/O x4, negative complaint of chest pain, SOB.   Assessment & Plan:  Covid vaccination; vaccinated 3/3  Active Problems:   Essential hypertension   OSA on CPAP   Esophageal varices (HCC)   Liver failure (HCC)   Unspecified cirrhosis of liver (HCC)   Type 2 diabetes mellitus with hyperglycemia (HCC)   Hypotension   Chronic diastolic heart failure (HCC)   Hyperlipidemia associated with type 2 diabetes mellitus (HCC)   Back pain   Anemia   Obstructive sleep apnea (adult) (pediatric)   Hypokalemia   Muscle spasm   CAD (coronary artery disease)   HLD (hyperlipidemia)   Chronic idiopathic thrombocytopenia (HCC)   Increased ammonia level   Obese   Fall at home, initial encounter   Hypotension due to hypovolemia   Fall/ -PT evaluation with possible need for placement due to frequent falls at home -Fall precautions -No  signs of injury on imaging -9/1 PT recommends SNF -9/3 ambulate patient every shift  Hypotension -8/21 albumin 12.5 g x 1 - 9/1 increase Midodrine 10 mg TID  Muscle spasm -Bilateral oblique muscle spasm LEFT>> RIGHT - Robaxin 500 mg BID -9/1 Lidoderm patch to lower back every 12 hours   AKI-likely prerenal (baseline Cr 1.2) -Appears dehydrated -IVF -Avoid nephrotoxic agents -Strict in and out +720.50ml - Daily weight Lab Results  Component Value Date   CREATININE 1.12 08/15/2021   CREATININE 1.20 08/14/2021   CREATININE 1.14 08/13/2021   CREATININE 1.30 (H) 08/12/2021   CREATININE 1.53 (H) 08/11/2021  - 9/2 resolved    Hypokalemia -Resolved  Hyponatremia - Most likely secondary to his liver failure -9/1 see hypophosphatemia  Hypophosphatemia - Phosphorus goal> 0.5 - 9/1 sodium phosphate 20 mmol   CAD/HTN/HLD -Prior CABG -Continue home medications   Chronic thrombocytopenia -Likely related to liver cirrhosis Results for KELDRICK, POMPLUN" (MRN [id number]) as of 08/14/2021 08:41  Ref. Range 07/28/2021 15:10 08/11/2021 05:58 08/12/2021 06:07 08/13/2021 05:30  Platelets Latest Ref Range: 150 - 400 K/uL 74 (L) 88 (L) 83 (L) 73 (L)  -No heparin agents -Stable   Iron deficiency anemia -Continue Fe supplementation -8/31 per patient and wife scheduled for iron infusion on Friday if patient still hospitalized will arrange for iron infusion. Lab Results  Component Value Date   HGB 8.6 (L) 08/15/2021   HGB 9.9 (L) 08/14/2021   HGB 8.4 (L)  08/14/2021   HGB 9.2 (L) 08/13/2021   HGB 8.4 (L) 08/12/2021     DM type II controlled with hyperglycemia -Semglee 30 units bedtime -9/2 Semglee 40 units daily - 9/2 increase NovoLog 15 units qac  - Moderate SSI   Liver cirrhosis -Per wife increased somnolence, currently A/O x4 - Monitor for encephalopathy  Increased Ammonia Level - Lactulose 20 g daily - Rifaximin 550 mg BID Lab Results  Component Value Date    AMMONIA 46 (H) 08/15/2021   AMMONIA 49 (H) 08/14/2021   AMMONIA 81 (H) 08/12/2021   AMMONIA 42 (H) 08/11/2021   AMMONIA 27 07/23/2021     OSA -CPAP at night  Obese (BMI 37.67 kg/m) -   DVT prophylaxis: SCD Code Status: Full Family Communication: 9/2 discussed at length on phone with patient's wife prognosis  answered all questions.   Status is: Inpatient  Remains inpatient appropriate because:Inpatient level of care appropriate due to severity of illness  Dispo: The patient is from: Home              Anticipated d/c is to: SNF?              Anticipated d/c date is: 3 days              Patient currently is not medically stable to d/c.      Consultants:    Procedures/Significant Events:     I have personally reviewed and interpreted all radiology studies and my findings are as above.  VENTILATOR SETTINGS:    Cultures   Antimicrobials:    Devices    LINES / TUBES:      Continuous Infusions:   Objective: Vitals:   08/14/21 1425 08/14/21 2117 08/15/21 0500 08/15/21 0513  BP: 126/68 114/65  104/60  Pulse: 78 81  80  Resp: 20     Temp: 98.4 F (36.9 C) 98.6 F (37 C)  98.7 F (37.1 C)  TempSrc: Oral     SpO2: 100% 98%  99%  Weight:   117.6 kg   Height:        Intake/Output Summary (Last 24 hours) at 08/15/2021 7096 Last data filed at 08/15/2021 0500 Gross per 24 hour  Intake 420 ml  Output 650 ml  Net -230 ml    Filed Weights   08/13/21 0500 08/14/21 0700 08/15/21 0500  Weight: 119.4 kg 121.7 kg 117.6 kg    Examination:  General: A/O x4, No acute respiratory distress Eyes: negative scleral hemorrhage, negative anisocoria, negative icterus ENT: Negative Runny nose, negative gingival bleeding, Neck:  Negative scars, masses, torticollis, lymphadenopathy, JVD Lungs: Clear to auscultation bilaterally without wheezes or crackles Cardiovascular: Regular rate and rhythm without murmur gallop or rub normal S1 and S2 Abdomen: OBESE,  negative abdominal pain, nondistended, positive soft, bowel sounds, no rebound, no ascites, no appreciable mass Extremities: No significant cyanosis, clubbing, or edema bilateral lower extremities Skin: Negative rashes, lesions, ulcers Psychiatric:  Negative depression, negative anxiety, negative fatigue, negative mania  Central nervous system:  Cranial nerves II through XII intact, tongue/uvula midline, all extremities muscle strength 5/5, sensation intact throughout, expressive aphasia, negative receptive aphasia.  .     Data Reviewed: Care during the described time interval was provided by me .  I have reviewed this patient's available data, including medical history, events of note, physical examination, and all test results as part of my evaluation.   CBC: Recent Labs  Lab 08/11/21 0558 08/12/21 0607 08/13/21 0530 08/14/21 0603 08/14/21  1258 08/15/21 0414  WBC 5.7 5.1 4.3 3.8* 4.1 3.8*  NEUTROABS 3.8  --  2.7 2.5  --  2.4  HGB 9.3* 8.4* 9.2* 8.4* 9.9* 8.6*  HCT 29.9* 27.5* 30.9* 28.3* 33.6* 29.4*  MCV 94.0 95.5 97.8 98.3 99.1 99.7  PLT 88* 83* 73* 72* 78* 90*    Basic Metabolic Panel: Recent Labs  Lab 08/11/21 0558 08/12/21 0607 08/13/21 0530 08/14/21 0603 08/15/21 0414  NA 131* 135 133* 135 138  K 2.4* 3.6 3.7 4.1 3.9  CL 86* 94* 96* 99 102  CO2 33* 33* 29 29 27   GLUCOSE 165* 251* 187* 284* 154*  BUN 31* 25* 19 17 15   CREATININE 1.53* 1.30* 1.14 1.20 1.12  CALCIUM 8.4* 8.6* 8.5* 8.3* 8.7*  MG  --  1.9 2.1 2.2 2.2  PHOS  --   --  1.9* 2.8 2.5    GFR: Estimated Creatinine Clearance: 89 mL/min (by C-G formula based on SCr of 1.12 mg/dL). Liver Function Tests: Recent Labs  Lab 08/11/21 0558 08/13/21 0530 08/14/21 0603 08/15/21 0414  AST 38 58* 44* 37  ALT 15 14 16 14   ALKPHOS 66 67 64 65  BILITOT 1.9* 2.1* 1.6* 1.5*  PROT 6.8 6.6 6.3* 6.6  ALBUMIN 3.0* 2.9* 2.7* 2.8*    No results for input(s): LIPASE, AMYLASE in the last 168 hours. Recent Labs   Lab 08/11/21 0558 08/12/21 0607 08/14/21 0924 08/15/21 0414  AMMONIA 42* 81* 49* 46*    Coagulation Profile: Recent Labs  Lab 08/15/21 0414  INR 1.2   Cardiac Enzymes: No results for input(s): CKTOTAL, CKMB, CKMBINDEX, TROPONINI in the last 168 hours. BNP (last 3 results) No results for input(s): PROBNP in the last 8760 hours. HbA1C: No results for input(s): HGBA1C in the last 72 hours. CBG: Recent Labs  Lab 08/14/21 0738 08/14/21 1208 08/14/21 1624 08/14/21 2118 08/15/21 0745  GLUCAP 237* 339* 263* 252* 147*    Lipid Profile: No results for input(s): CHOL, HDL, LDLCALC, TRIG, CHOLHDL, LDLDIRECT in the last 72 hours. Thyroid Function Tests: No results for input(s): TSH, T4TOTAL, FREET4, T3FREE, THYROIDAB in the last 72 hours. Anemia Panel: No results for input(s): VITAMINB12, FOLATE, FERRITIN, TIBC, IRON, RETICCTPCT in the last 72 hours. Urine analysis:    Component Value Date/Time   COLORURINE YELLOW 08/11/2021 1420   APPEARANCEUR CLEAR 08/11/2021 1420   LABSPEC 1.009 08/11/2021 1420   PHURINE 7.0 08/11/2021 1420   GLUCOSEU NEGATIVE 08/11/2021 1420   HGBUR NEGATIVE 08/11/2021 1420   BILIRUBINUR NEGATIVE 08/11/2021 1420   KETONESUR NEGATIVE 08/11/2021 1420   PROTEINUR NEGATIVE 08/11/2021 1420   UROBILINOGEN 1.0 08/28/2015 1634   NITRITE NEGATIVE 08/11/2021 1420   LEUKOCYTESUR NEGATIVE 08/11/2021 1420   Sepsis Labs: @LABRCNTIP (procalcitonin:4,lacticidven:4)  ) Recent Results (from the past 240 hour(s))  Resp Panel by RT-PCR (Flu A&B, Covid) Nasopharyngeal Swab     Status: None   Collection Time: 08/11/21  7:09 AM   Specimen: Nasopharyngeal Swab; Nasopharyngeal(NP) swabs in vial transport medium  Result Value Ref Range Status   SARS Coronavirus 2 by RT PCR NEGATIVE NEGATIVE Final    Comment: (NOTE) SARS-CoV-2 target nucleic acids are NOT DETECTED.  The SARS-CoV-2 RNA is generally detectable in upper respiratory specimens during the acute phase of  infection. The lowest concentration of SARS-CoV-2 viral copies this assay can detect is 138 copies/mL. A negative result does not preclude SARS-Cov-2 infection and should not be used as the sole basis for treatment or other patient management decisions. A  negative result may occur with  improper specimen collection/handling, submission of specimen other than nasopharyngeal swab, presence of viral mutation(s) within the areas targeted by this assay, and inadequate number of viral copies(<138 copies/mL). A negative result must be combined with clinical observations, patient history, and epidemiological information. The expected result is Negative.  Fact Sheet for Patients:  EntrepreneurPulse.com.au  Fact Sheet for Healthcare Providers:  IncredibleEmployment.be  This test is no t yet approved or cleared by the [place] FDA and  has been authorized for detection and/or diagnosis of SARS-CoV-2 by FDA under an Emergency Use Authorization (EUA). This EUA will remain  in effect (meaning this test can be used) for the duration of the COVID-19 declaration under Section 564(b)(1) of the Act, 21 U.S.C.section 360bbb-3(b)(1), unless the authorization is terminated  or revoked sooner.       Influenza A by PCR NEGATIVE NEGATIVE Final   Influenza B by PCR NEGATIVE NEGATIVE Final    Comment: (NOTE) The Xpert Xpress SARS-CoV-2/FLU/RSV plus assay is intended as an aid in the diagnosis of influenza from Nasopharyngeal swab specimens and should not be used as a sole basis for treatment. Nasal washings and aspirates are unacceptable for Xpert Xpress SARS-CoV-2/FLU/RSV testing.  Fact Sheet for Patients: EntrepreneurPulse.com.au  Fact Sheet for Healthcare Providers: IncredibleEmployment.be  This test is not yet approved or cleared by the [place] FDA and has been authorized for detection and/or diagnosis of SARS-CoV-2  by FDA under an Emergency Use Authorization (EUA). This EUA will remain in effect (meaning this test can be used) for the duration of the COVID-19 declaration under Section 564(b)(1) of the Act, 21 U.S.C. section 360bbb-3(b)(1), unless the authorization is terminated or revoked.  Performed at [facility], [address] [place], [place] [address]          Radiology Studies: No results found.      Scheduled Meds:  aspirin EC  81 mg Oral QHS   ferrous sulfate  325 mg Oral q AM   insulin aspart  0-15 Units Subcutaneous TID WC   insulin aspart  0-5 Units Subcutaneous QHS   insulin aspart  15 Units Subcutaneous TID AC   insulin glargine-yfgn  30 Units Subcutaneous QHS   insulin glargine-yfgn  40 Units Subcutaneous Daily   lactulose  20 g Oral Daily   lidocaine  4 patch Transdermal Q24H   magnesium oxide  400 mg Oral Daily   midodrine  10 mg Oral TID WC   nadolol  20 mg Oral Daily   pantoprazole  40 mg Oral BID AC   potassium chloride SA  20 mEq Oral BID   pravastatin  20 mg Oral q1800   rifaximin  550 mg Oral BID   venlafaxine XR  37.5 mg Oral Daily   Continuous Infusions:   LOS: 0 days   The patient is critically ill with multiple organ systems failure and requires high complexity decision making for assessment and support, frequent evaluation and titration of therapies, application of advanced monitoring technologies and extensive interpretation of multiple databases. Critical Care Time devoted to patient care services described in this note  Time spent: 40 minutes     Quantrell Splitt, Geraldo Docker, MD [facility]   If 7PM-7AM, please contact night-coverage 08/15/2021, 8:22 AM

## 2021-08-15 NOTE — TOC Progression Note (Signed)
Transition of Care [facility]) - Progression Note    Patient Details  Name: [name] MRN: [id number] Date of Birth: 1960-12-30  Transition of Care [facility]) CM/SW Contact  Natasha Bence, LCSW Phone Number: 08/15/2021, 4:25 PM  Clinical Narrative:    No bed offers. CSW faxed out to additional facilities.   Expected Discharge Plan: Skilled Nursing Facility Barriers to Discharge: No SNF bed, Insurance Authorization  Expected Discharge Plan and Services Expected Discharge Plan: [place]                                               Social Determinants of Health (SDOH) Interventions    Readmission Risk Interventions Readmission Risk Prevention Plan 07/22/2021 03/24/2021 12/10/2020  Transportation Screening Complete Complete Complete  HRI or Home Care Consult Complete - Complete  Social Work Consult for [place] Planning/Counseling Complete - Complete  Palliative Care Screening Not Applicable - Not Applicable  Medication Review [employer] photographer) Complete Complete Complete  PCP or Specialist appointment within 3-5 days of discharge - Complete -  [place] or [place] - Complete -  SW Recovery Care/Counseling Consult - Complete -  Palliative Care Screening - Not Applicable -  [place] - Not Applicable -  Some recent data might be hidden

## 2021-08-15 NOTE — Progress Notes (Signed)
Pt was ambulated from bed to chair. Pt is a max 2 assist. Pt tolerated sitting in the chair for around 2 hours.

## 2021-08-15 NOTE — Progress Notes (Signed)
Pt tolerated dressing change to lower back.well.This LPN  assisted NT in bathing Pt and changed bed linen from pt having BM, and placed a new male external catheter.  PT stated he "was not in any pain or discomfort". Call bell placed in reach. Bed alarm on, Cell phone placed on side table as requested. Will continue to monitor

## 2021-08-16 DIAGNOSIS — E876 Hypokalemia: Secondary | ICD-10-CM | POA: Diagnosis not present

## 2021-08-16 DIAGNOSIS — D508 Other iron deficiency anemias: Secondary | ICD-10-CM | POA: Diagnosis not present

## 2021-08-16 DIAGNOSIS — M545 Low back pain, unspecified: Secondary | ICD-10-CM | POA: Diagnosis not present

## 2021-08-16 DIAGNOSIS — I2510 Atherosclerotic heart disease of native coronary artery without angina pectoris: Secondary | ICD-10-CM | POA: Diagnosis not present

## 2021-08-16 LAB — CBC WITH DIFFERENTIAL/PLATELET
Abs Immature Granulocytes: 0.01 10*3/uL (ref 0.00–0.07)
Basophils Absolute: 0 10*3/uL (ref 0.0–0.1)
Basophils Relative: 1 %
Eosinophils Absolute: 0.1 10*3/uL (ref 0.0–0.5)
Eosinophils Relative: 3 %
HCT: 29.3 % — ABNORMAL LOW (ref 39.0–52.0)
Hemoglobin: 8.6 g/dL — ABNORMAL LOW (ref 13.0–17.0)
Immature Granulocytes: 0 %
Lymphocytes Relative: 25 %
Lymphs Abs: 0.9 10*3/uL (ref 0.7–4.0)
MCH: 29.1 pg (ref 26.0–34.0)
MCHC: 29.4 g/dL — ABNORMAL LOW (ref 30.0–36.0)
MCV: 99 fL (ref 80.0–100.0)
Monocytes Absolute: 0.4 10*3/uL (ref 0.1–1.0)
Monocytes Relative: 11 %
Neutro Abs: 2.1 10*3/uL (ref 1.7–7.7)
Neutrophils Relative %: 60 %
Platelets: 92 10*3/uL — ABNORMAL LOW (ref 150–400)
RBC: 2.96 MIL/uL — ABNORMAL LOW (ref 4.22–5.81)
RDW: 19.4 % — ABNORMAL HIGH (ref 11.5–15.5)
WBC: 3.6 10*3/uL — ABNORMAL LOW (ref 4.0–10.5)
nRBC: 0 % (ref 0.0–0.2)

## 2021-08-16 LAB — COMPREHENSIVE METABOLIC PANEL
ALT: 11 U/L (ref 0–44)
AST: 29 U/L (ref 15–41)
Albumin: 2.6 g/dL — ABNORMAL LOW (ref 3.5–5.0)
Alkaline Phosphatase: 60 U/L (ref 38–126)
Anion gap: 4 — ABNORMAL LOW (ref 5–15)
BUN: 14 mg/dL (ref 8–23)
CO2: 26 mmol/L (ref 22–32)
Calcium: 8 mg/dL — ABNORMAL LOW (ref 8.9–10.3)
Chloride: 104 mmol/L (ref 98–111)
Creatinine, Ser: 1.09 mg/dL (ref 0.61–1.24)
GFR, Estimated: >60 mL/min (ref >60)
Glucose, Bld: 104 mg/dL — ABNORMAL HIGH (ref 70–99)
Potassium: 4 mmol/L (ref 3.5–5.1)
Sodium: 134 mmol/L — ABNORMAL LOW (ref 135–145)
Total Bilirubin: 1.5 mg/dL — ABNORMAL HIGH (ref 0.3–1.2)
Total Protein: 6.4 g/dL — ABNORMAL LOW (ref 6.5–8.1)

## 2021-08-16 LAB — PHOSPHORUS: Phosphorus: 3 mg/dL (ref 2.5–4.6)

## 2021-08-16 LAB — PROTIME-INR
INR: 1.3 — ABNORMAL HIGH (ref 0.8–1.2)
Prothrombin Time: 16 seconds — ABNORMAL HIGH (ref 11.4–15.2)

## 2021-08-16 LAB — MAGNESIUM: Magnesium: 2 mg/dL (ref 1.7–2.4)

## 2021-08-16 LAB — GLUCOSE, CAPILLARY
Glucose-Capillary: 99 mg/dL (ref 70–99)
Glucose-Capillary: 190 mg/dL — ABNORMAL HIGH (ref 70–99)
Glucose-Capillary: 198 mg/dL — ABNORMAL HIGH (ref 70–99)
Glucose-Capillary: 179 mg/dL — ABNORMAL HIGH (ref 70–99)

## 2021-08-16 LAB — AMMONIA: Ammonia: 43 umol/L — ABNORMAL HIGH (ref 9–35)

## 2021-08-16 NOTE — Progress Notes (Signed)
PROGRESS NOTE    [name]  GDJ:[id number] DOB: 1961/02/18 DOA: 08/11/2021 PCP: [name], MD   Brief Narrative:  [name] is a 60 y.o. WM PMHx CAD with prior CABG, cirrhosis, hypertension, dyslipidemia, OSA on CPAP, thrombocytopenia, and type 2 diabetes   Presented to the ED after falling at home.  He states that he was using his walker when he tripped over a rug.  He is unsure of losing consciousness after hitting his head, but he did have difficulty getting off the floor due to low back pain.  He denies any other recent events and states that he lives with his wife at home and takes his medications as prescribed.  Wife states that he has been getting more deconditioned lately and has been falling more frequently.   ED Course: Stable vital signs noted and patient is afebrile.  Potassium is 2.4, BUN 31, creatinine 1.53, platelets 88,000, sodium 131, and ammonia 42.  EKG with no significant changes identified.  He has been given lactulose enema in the ED as well as some potassium supplementation.   Subjective: 9/4 afebrile overnight A/O x4, laying in bed comfortably.  States ambulated today.   Assessment & Plan:  Covid vaccination; vaccinated 3/3  Active Problems:   Essential hypertension   OSA on CPAP   Esophageal varices (HCC)   Liver failure (HCC)   Unspecified cirrhosis of liver (HCC)   Type 2 diabetes mellitus with hyperglycemia (HCC)   Hypotension   Chronic diastolic heart failure (HCC)   Hyperlipidemia associated with type 2 diabetes mellitus (HCC)   Back pain   Anemia   Obstructive sleep apnea (adult) (pediatric)   Hypokalemia   Muscle spasm   CAD (coronary artery disease)   HLD (hyperlipidemia)   Chronic idiopathic thrombocytopenia (HCC)   Increased ammonia level   Obese   Fall at home, initial encounter   Hypotension due to hypovolemia   Fall/ -PT evaluation with possible need for placement due to frequent falls at home -Fall  precautions -No signs of injury on imaging -9/1 PT recommends SNF -9/3 ambulate patient every shift  Hypotension -8/21 albumin 12.5 g x 1 - 9/1 increase Midodrine 10 mg TID  Muscle spasm -Bilateral oblique muscle spasm LEFT>> RIGHT - Robaxin 500 mg BID -9/1 Lidoderm patch to lower back every 12 hours   AKI-likely prerenal (baseline Cr 1.2) -Appears dehydrated -IVF -Avoid nephrotoxic agents -Strict in and out +720.4ml - Daily weight Lab Results  Component Value Date   CREATININE 1.12 08/15/2021   CREATININE 1.20 08/14/2021   CREATININE 1.14 08/13/2021   CREATININE 1.30 (H) 08/12/2021   CREATININE 1.53 (H) 08/11/2021  - 9/2 resolved    Hypokalemia -Resolved  Hyponatremia - Most likely secondary to his liver failure -9/1 see hypophosphatemia  Hypophosphatemia - Phosphorus goal> 0.5 - 9/1 sodium phosphate 20 mmol   CAD/HTN/HLD -Prior CABG -Continue home medications   Chronic thrombocytopenia -Likely related to liver cirrhosis Results for NAJIR, ROOP" (MRN [id number]) as of 08/14/2021 08:41  Ref. Range 07/28/2021 15:10 08/11/2021 05:58 08/12/2021 06:07 08/13/2021 05:30  Platelets Latest Ref Range: 150 - 400 K/uL 74 (L) 88 (L) 83 (L) 73 (L)  -No heparin agents -Stable   Iron deficiency anemia -Continue Fe supplementation -8/31 per patient and wife scheduled for iron infusion on Friday if patient still hospitalized will arrange for iron infusion. Lab Results  Component Value Date   HGB 8.6 (L) 08/16/2021   HGB 8.6 (L) 08/15/2021   HGB  9.9 (L) 08/14/2021   HGB 8.4 (L) 08/14/2021   HGB 9.2 (L) 08/13/2021  ]   DM type II controlled with hyperglycemia -Semglee 30 units bedtime -9/2 Semglee 40 units daily - 9/2 increase NovoLog 15 units qac  - Moderate SSI   Liver cirrhosis -Per wife increased somnolence, currently A/O x4 - Monitor for encephalopathy Lab Results  Component Value Date   INR 1.2 08/15/2021   INR 1.3 (H) 07/24/2021   INR 1.3 (H)  07/23/2021   INR 1.3 (H) 07/22/2021   INR 1.2 07/21/2021    Increased Ammonia Level - Lactulose 20 g daily - Rifaximin 550 mg BID Lab Results  Component Value Date   AMMONIA 43 (H) 08/16/2021   AMMONIA 46 (H) 08/15/2021   AMMONIA 49 (H) 08/14/2021   AMMONIA 81 (H) 08/12/2021   AMMONIA 42 (H) 08/11/2021     OSA -CPAP at night  Obese (BMI 37.67 kg/m) -   DVT prophylaxis: SCD Code Status: Full Family Communication: 9/2 discussed at length on phone with patient's wife prognosis  answered all questions.   Status is: Inpatient  Remains inpatient appropriate because:Inpatient level of care appropriate due to severity of illness  Dispo: The patient is from: Home              Anticipated d/c is to: SNF?              Anticipated d/c date is: 3 days              Patient currently is not medically stable to d/c.      Consultants:    Procedures/Significant Events:     I have personally reviewed and interpreted all radiology studies and my findings are as above.  VENTILATOR SETTINGS:    Cultures   Antimicrobials:    Devices    LINES / TUBES:      Continuous Infusions:   Objective: Vitals:   08/15/21 2131 08/15/21 2312 08/16/21 0433 08/16/21 0500  BP: 116/62  97/61   Pulse: 74 75 72   Resp:  16 18   Temp:   98.4 F (36.9 C)   TempSrc:   Oral   SpO2: 97% 96% 99%   Weight:    117.1 kg  Height:        Intake/Output Summary (Last 24 hours) at 08/16/2021 1194 Last data filed at 08/16/2021 0500 Gross per 24 hour  Intake 960 ml  Output 500 ml  Net 460 ml    Filed Weights   08/14/21 0700 08/15/21 0500 08/16/21 0500  Weight: 121.7 kg 117.6 kg 117.1 kg    Examination:  General: A/O x4, No acute respiratory distress Eyes: negative scleral hemorrhage, negative anisocoria, negative icterus ENT: Negative Runny nose, negative gingival bleeding, Neck:  Negative scars, masses, torticollis, lymphadenopathy, JVD Lungs: Clear to auscultation  bilaterally without wheezes or crackles Cardiovascular: Regular rate and rhythm without murmur gallop or rub normal S1 and S2 Abdomen: OBESE, negative abdominal pain, nondistended, positive soft, bowel sounds, no rebound, no ascites, no appreciable mass Extremities: No significant cyanosis, clubbing, or edema bilateral lower extremities Skin: Negative rashes, lesions, ulcers Psychiatric:  Negative depression, negative anxiety, negative fatigue, negative mania  Central nervous system:  Cranial nerves II through XII intact, tongue/uvula midline, all extremities muscle strength 5/5, sensation intact throughout, expressive aphasia, negative receptive aphasia.  .     Data Reviewed: Care during the described time interval was provided by me .  I have reviewed this patient's available data,  including medical history, events of note, physical examination, and all test results as part of my evaluation.   CBC: Recent Labs  Lab 08/11/21 0558 08/12/21 0607 08/13/21 0530 08/14/21 0603 08/14/21 1258 08/15/21 0414  WBC 5.7 5.1 4.3 3.8* 4.1 3.8*  NEUTROABS 3.8  --  2.7 2.5  --  2.4  HGB 9.3* 8.4* 9.2* 8.4* 9.9* 8.6*  HCT 29.9* 27.5* 30.9* 28.3* 33.6* 29.4*  MCV 94.0 95.5 97.8 98.3 99.1 99.7  PLT 88* 83* 73* 72* 78* 90*    Basic Metabolic Panel: Recent Labs  Lab 08/11/21 0558 08/12/21 0607 08/13/21 0530 08/14/21 0603 08/15/21 0414  NA 131* 135 133* 135 138  K 2.4* 3.6 3.7 4.1 3.9  CL 86* 94* 96* 99 102  CO2 33* 33* 29 29 27   GLUCOSE 165* 251* 187* 284* 154*  BUN 31* 25* 19 17 15   CREATININE 1.53* 1.30* 1.14 1.20 1.12  CALCIUM 8.4* 8.6* 8.5* 8.3* 8.7*  MG  --  1.9 2.1 2.2 2.2  PHOS  --   --  1.9* 2.8 2.5    GFR: Estimated Creatinine Clearance: 88.8 mL/min (by C-G formula based on SCr of 1.12 mg/dL). Liver Function Tests: Recent Labs  Lab 08/11/21 0558 08/13/21 0530 08/14/21 0603 08/15/21 0414  AST 38 58* 44* 37  ALT 15 14 16 14   ALKPHOS 66 67 64 65  BILITOT 1.9* 2.1* 1.6*  1.5*  PROT 6.8 6.6 6.3* 6.6  ALBUMIN 3.0* 2.9* 2.7* 2.8*    No results for input(s): LIPASE, AMYLASE in the last 168 hours. Recent Labs  Lab 08/11/21 0558 08/12/21 0607 08/14/21 0924 08/15/21 0414  AMMONIA 42* 81* 49* 46*    Coagulation Profile: Recent Labs  Lab 08/15/21 0414  INR 1.2    Cardiac Enzymes: No results for input(s): CKTOTAL, CKMB, CKMBINDEX, TROPONINI in the last 168 hours. BNP (last 3 results) No results for input(s): PROBNP in the last 8760 hours. HbA1C: No results for input(s): HGBA1C in the last 72 hours. CBG: Recent Labs  Lab 08/15/21 0745 08/15/21 1137 08/15/21 1629 08/15/21 2130 08/16/21 0725  GLUCAP 147* 229* 200* 186* 99    Lipid Profile: No results for input(s): CHOL, HDL, LDLCALC, TRIG, CHOLHDL, LDLDIRECT in the last 72 hours. Thyroid Function Tests: No results for input(s): TSH, T4TOTAL, FREET4, T3FREE, THYROIDAB in the last 72 hours. Anemia Panel: No results for input(s): VITAMINB12, FOLATE, FERRITIN, TIBC, IRON, RETICCTPCT in the last 72 hours. Urine analysis:    Component Value Date/Time   COLORURINE YELLOW 08/11/2021 1420   APPEARANCEUR CLEAR 08/11/2021 1420   LABSPEC 1.009 08/11/2021 1420   PHURINE 7.0 08/11/2021 1420   GLUCOSEU NEGATIVE 08/11/2021 1420   HGBUR NEGATIVE 08/11/2021 1420   BILIRUBINUR NEGATIVE 08/11/2021 1420   KETONESUR NEGATIVE 08/11/2021 1420   PROTEINUR NEGATIVE 08/11/2021 1420   UROBILINOGEN 1.0 08/28/2015 1634   NITRITE NEGATIVE 08/11/2021 1420   LEUKOCYTESUR NEGATIVE 08/11/2021 1420   Sepsis Labs: @LABRCNTIP (procalcitonin:4,lacticidven:4)  ) Recent Results (from the past 240 hour(s))  Resp Panel by RT-PCR (Flu A&B, Covid) Nasopharyngeal Swab     Status: None   Collection Time: 08/11/21  7:09 AM   Specimen: Nasopharyngeal Swab; Nasopharyngeal(NP) swabs in vial transport medium  Result Value Ref Range Status   SARS Coronavirus 2 by RT PCR NEGATIVE NEGATIVE Final    Comment: (NOTE) SARS-CoV-2  target nucleic acids are NOT DETECTED.  The SARS-CoV-2 RNA is generally detectable in upper respiratory specimens during the acute phase of infection. The lowest concentration of SARS-CoV-2  viral copies this assay can detect is 138 copies/mL. A negative result does not preclude SARS-Cov-2 infection and should not be used as the sole basis for treatment or other patient management decisions. A negative result may occur with  improper specimen collection/handling, submission of specimen other than nasopharyngeal swab, presence of viral mutation(s) within the areas targeted by this assay, and inadequate number of viral copies(<138 copies/mL). A negative result must be combined with clinical observations, patient history, and epidemiological information. The expected result is Negative.  Fact Sheet for Patients:  EntrepreneurPulse.com.au  Fact Sheet for Healthcare Providers:  IncredibleEmployment.be  This test is no t yet approved or cleared by the [place] FDA and  has been authorized for detection and/or diagnosis of SARS-CoV-2 by FDA under an Emergency Use Authorization (EUA). This EUA will remain  in effect (meaning this test can be used) for the duration of the COVID-19 declaration under Section 564(b)(1) of the Act, 21 U.S.C.section 360bbb-3(b)(1), unless the authorization is terminated  or revoked sooner.       Influenza A by PCR NEGATIVE NEGATIVE Final   Influenza B by PCR NEGATIVE NEGATIVE Final    Comment: (NOTE) The Xpert Xpress SARS-CoV-2/FLU/RSV plus assay is intended as an aid in the diagnosis of influenza from Nasopharyngeal swab specimens and should not be used as a sole basis for treatment. Nasal washings and aspirates are unacceptable for Xpert Xpress SARS-CoV-2/FLU/RSV testing.  Fact Sheet for Patients: EntrepreneurPulse.com.au  Fact Sheet for Healthcare  Providers: IncredibleEmployment.be  This test is not yet approved or cleared by the [place] FDA and has been authorized for detection and/or diagnosis of SARS-CoV-2 by FDA under an Emergency Use Authorization (EUA). This EUA will remain in effect (meaning this test can be used) for the duration of the COVID-19 declaration under Section 564(b)(1) of the Act, 21 U.S.C. section 360bbb-3(b)(1), unless the authorization is terminated or revoked.  Performed at [facility], [address], [place], [place] [address]          Radiology Studies: No results found.      Scheduled Meds:  aspirin EC  81 mg Oral QHS   ferrous sulfate  325 mg Oral q AM   insulin aspart  0-15 Units Subcutaneous TID WC   insulin aspart  0-5 Units Subcutaneous QHS   insulin aspart  15 Units Subcutaneous TID AC   insulin glargine-yfgn  30 Units Subcutaneous QHS   insulin glargine-yfgn  40 Units Subcutaneous Daily   lactulose  20 g Oral Daily   lidocaine  4 patch Transdermal Q24H   magnesium oxide  400 mg Oral Daily   midodrine  10 mg Oral TID WC   nadolol  20 mg Oral Daily   pantoprazole  40 mg Oral BID AC   potassium chloride SA  20 mEq Oral BID   pravastatin  20 mg Oral q1800   rifaximin  550 mg Oral BID   venlafaxine XR  37.5 mg Oral Daily   Continuous Infusions:   LOS: 1 day   The patient is critically ill with multiple organ systems failure and requires high complexity decision making for assessment and support, frequent evaluation and titration of therapies, application of advanced monitoring technologies and extensive interpretation of multiple databases. Critical Care Time devoted to patient care services described in this note  Time spent: 40 minutes     Raylene Carmickle, Geraldo Docker, MD [facility]   If 7PM-7AM, please contact night-coverage 08/16/2021, 7:27 AM

## 2021-08-16 NOTE — TOC Progression Note (Signed)
Transition of Care [facility]) - Progression Note    Patient Details  Name: [name] MRN: [id number] Date of Birth: [birth year]  [facility]) CM/SW Contact  Natasha Bence, LCSW Phone Number: 08/16/2021, 12:19 PM  Clinical Narrative:    No bed offers received. CSW faxed out patient to additional SNF's. CSW followed up with [facility], [facility], and [facility]. Ebony Hail with [facility] reported that the BO will have to run patient's insurance to see if patient's insurance will cover SNF. Ebony Hail reported that [place] and or Shanon with [facility] will be able to run patient's insurance on Monday. Lavella Lemons with [facility] reported that she will also have to run patient's insurance but it will not be able to be ran until Tuesday. CSW LVM with [facility]. [facility]'s front desk reported that admissions would not be available until Tuesday. TOC to follow.   Expected Discharge Plan: Skilled Nursing Facility Barriers to Discharge: No SNF bed, Insurance Authorization  Expected Discharge Plan and Services Expected Discharge Plan: [place]                                               Social Determinants of Health (SDOH) Interventions    Readmission Risk Interventions Readmission Risk Prevention Plan 07/22/2021 03/24/2021 12/10/2020  Transportation Screening Complete Complete Complete  HRI or Home Care Consult Complete - Complete  Social Work Consult for [place] Planning/Counseling Complete - Complete  Palliative Care Screening Not Applicable - Not Applicable  Medication Review Press photographer) Complete Complete Complete  PCP or Specialist appointment within 3-5 days of discharge - Complete -  [place] or [place] - Complete -  SW Recovery Care/Counseling Consult - Complete -  Palliative Care Screening - Not Applicable -  [place] - Not Applicable -  Some recent data might be hidden

## 2021-08-17 ENCOUNTER — Encounter (HOSPITAL_COMMUNITY): Payer: Self-pay | Admitting: Internal Medicine

## 2021-08-17 DIAGNOSIS — M545 Low back pain, unspecified: Secondary | ICD-10-CM | POA: Diagnosis not present

## 2021-08-17 DIAGNOSIS — E876 Hypokalemia: Secondary | ICD-10-CM | POA: Diagnosis not present

## 2021-08-17 DIAGNOSIS — D508 Other iron deficiency anemias: Secondary | ICD-10-CM | POA: Diagnosis not present

## 2021-08-17 DIAGNOSIS — I2510 Atherosclerotic heart disease of native coronary artery without angina pectoris: Secondary | ICD-10-CM | POA: Diagnosis not present

## 2021-08-17 LAB — CBC WITH DIFFERENTIAL/PLATELET
Abs Immature Granulocytes: 0.02 10*3/uL (ref 0.00–0.07)
Basophils Absolute: 0 10*3/uL (ref 0.0–0.1)
Basophils Relative: 1 %
Eosinophils Absolute: 0.1 10*3/uL (ref 0.0–0.5)
Eosinophils Relative: 2 %
HCT: 30.3 % — ABNORMAL LOW (ref 39.0–52.0)
Hemoglobin: 9 g/dL — ABNORMAL LOW (ref 13.0–17.0)
Immature Granulocytes: 1 %
Lymphocytes Relative: 24 %
Lymphs Abs: 0.8 10*3/uL (ref 0.7–4.0)
MCH: 28.8 pg (ref 26.0–34.0)
MCHC: 29.7 g/dL — ABNORMAL LOW (ref 30.0–36.0)
MCV: 97.1 fL (ref 80.0–100.0)
Monocytes Absolute: 0.5 10*3/uL (ref 0.1–1.0)
Monocytes Relative: 14 %
Neutro Abs: 2 10*3/uL (ref 1.7–7.7)
Neutrophils Relative %: 58 %
Platelets: 109 10*3/uL — ABNORMAL LOW (ref 150–400)
RBC: 3.12 MIL/uL — ABNORMAL LOW (ref 4.22–5.81)
RDW: 19 % — ABNORMAL HIGH (ref 11.5–15.5)
WBC: 3.3 10*3/uL — ABNORMAL LOW (ref 4.0–10.5)
nRBC: 0 % (ref 0.0–0.2)

## 2021-08-17 LAB — COMPREHENSIVE METABOLIC PANEL
ALT: 12 U/L (ref 0–44)
AST: 31 U/L (ref 15–41)
Albumin: 2.6 g/dL — ABNORMAL LOW (ref 3.5–5.0)
Alkaline Phosphatase: 72 U/L (ref 38–126)
Anion gap: 6 (ref 5–15)
BUN: 13 mg/dL (ref 8–23)
CO2: 26 mmol/L (ref 22–32)
Calcium: 8.6 mg/dL — ABNORMAL LOW (ref 8.9–10.3)
Chloride: 104 mmol/L (ref 98–111)
Creatinine, Ser: 1.07 mg/dL (ref 0.61–1.24)
GFR, Estimated: >60 mL/min (ref >60)
Glucose, Bld: 114 mg/dL — ABNORMAL HIGH (ref 70–99)
Potassium: 4.1 mmol/L (ref 3.5–5.1)
Sodium: 136 mmol/L (ref 135–145)
Total Bilirubin: 1.4 mg/dL — ABNORMAL HIGH (ref 0.3–1.2)
Total Protein: 6.5 g/dL (ref 6.5–8.1)

## 2021-08-17 LAB — MAGNESIUM: Magnesium: 2.1 mg/dL (ref 1.7–2.4)

## 2021-08-17 LAB — GLUCOSE, CAPILLARY
Glucose-Capillary: 93 mg/dL (ref 70–99)
Glucose-Capillary: 156 mg/dL — ABNORMAL HIGH (ref 70–99)
Glucose-Capillary: 212 mg/dL — ABNORMAL HIGH (ref 70–99)
Glucose-Capillary: 263 mg/dL — ABNORMAL HIGH (ref 70–99)

## 2021-08-17 LAB — AMMONIA: Ammonia: 49 umol/L — ABNORMAL HIGH (ref 9–35)

## 2021-08-17 LAB — PROTIME-INR
INR: 1.2 (ref 0.8–1.2)
Prothrombin Time: 15.5 seconds — ABNORMAL HIGH (ref 11.4–15.2)

## 2021-08-17 LAB — PHOSPHORUS: Phosphorus: 2.9 mg/dL (ref 2.5–4.6)

## 2021-08-17 NOTE — Progress Notes (Signed)
Patient refused CPAP for tonight. Unit still at bedside.

## 2021-08-17 NOTE — Plan of Care (Signed)

## 2021-08-17 NOTE — Tx Team (Signed)
Patient ambulated to chair. Patient tolerated fairly.

## 2021-08-17 NOTE — TOC Progression Note (Signed)
Transition of Care [facility]) - Progression Note    Patient Details  Name: [name] MRN: [id number] Date of Birth: [birth year]  [facility]) CM/SW Contact  Boneta Lucks, RN Phone Number: 08/17/2021, 1:14 PM  Clinical Narrative:   Wife is accepting bed offer from [facility]. TOC checking with Debbie about INS AUTH, patient has BC/BS and Medicare.    Expected Discharge Plan: Skilled Nursing Facility Barriers to Discharge: No SNF bed, Insurance Authorization  Expected Discharge Plan and Services Expected Discharge Plan: [place]     Readmission Risk Interventions Readmission Risk Prevention Plan 07/22/2021 03/24/2021 12/10/2020  Transportation Screening Complete Complete Complete  HRI or Home Care Consult Complete - Complete  Social Work Consult for [place] Planning/Counseling Complete - Complete  Palliative Care Screening Not Applicable - Not Applicable  Medication Review Press photographer) Complete Complete Complete  PCP or Specialist appointment within 3-5 days of discharge - Complete -  [place] or [place] - Complete -  SW Recovery Care/Counseling Consult - Complete -  Palliative Care Screening - Not Applicable -  [place] - Not Applicable -  Some recent data might be hidden

## 2021-08-17 NOTE — Progress Notes (Signed)
PROGRESS NOTE    DONTARIOUS SCHAUM  LKG:[id number] DOB: Jul 17, 1961 DOA: 08/11/2021 PCP: [name], MD   Brief Narrative:  [name] is a 60 y.o. WM PMHx CAD with prior CABG, cirrhosis, hypertension, dyslipidemia, OSA on CPAP, thrombocytopenia, and type 2 diabetes   Presented to the ED after falling at home.  He states that he was using his walker when he tripped over a rug.  He is unsure of losing consciousness after hitting his head, but he did have difficulty getting off the floor due to low back pain.  He denies any other recent events and states that he lives with his wife at home and takes his medications as prescribed.  Wife states that he has been getting more deconditioned lately and has been falling more frequently.   ED Course: Stable vital signs noted and patient is afebrile.  Potassium is 2.4, BUN 31, creatinine 1.53, platelets 88,000, sodium 131, and ammonia 42.  EKG with no significant changes identified.  He has been given lactulose enema in the ED as well as some potassium supplementation.   Subjective: 9/5 afebrile overnight A/O x4, staff has not been ambulating patient as requested.   Assessment & Plan:  Covid vaccination; vaccinated 3/3  Active Problems:   Essential hypertension   OSA on CPAP   Esophageal varices (HCC)   Liver failure (HCC)   Unspecified cirrhosis of liver (HCC)   Type 2 diabetes mellitus with hyperglycemia (HCC)   Hypotension   Chronic diastolic heart failure (HCC)   Hyperlipidemia associated with type 2 diabetes mellitus (HCC)   Back pain   Anemia   Obstructive sleep apnea (adult) (pediatric)   Hypokalemia   Muscle spasm   CAD (coronary artery disease)   HLD (hyperlipidemia)   Chronic idiopathic thrombocytopenia (HCC)   Increased ammonia level   Obese   Fall at home, initial encounter   Hypotension due to hypovolemia   Fall/ -PT evaluation with possible need for placement due to frequent falls at home -Fall  precautions -No signs of injury on imaging -9/1 PT recommends SNF -9/3 ambulate patient every shift  Hypotension -8/21 albumin 12.5 g x 1 - 9/1 increase Midodrine 10 mg TID  Muscle spasm -Bilateral oblique muscle spasm LEFT>> RIGHT - Robaxin 500 mg BID -9/1 Lidoderm patch to lower back every 12 hours   AKI-likely prerenal (baseline Cr 1.2) -Appears dehydrated -IVF -Avoid nephrotoxic agents -Strict in and out +720.10ml - Daily weight Lab Results  Component Value Date   CREATININE 1.07 08/17/2021   CREATININE 1.09 08/16/2021   CREATININE 1.12 08/15/2021   CREATININE 1.20 08/14/2021   CREATININE 1.14 08/13/2021  - 9/2 resolved    Hypokalemia -Resolved  Hyponatremia - Most likely secondary to his liver failure -9/1 see hypophosphatemia  Hypophosphatemia - Phosphorus goal> 0.5 - 9/1 sodium phosphate 20 mmol   CAD/HTN/HLD -Prior CABG -Continue home medications   Chronic thrombocytopenia -Likely related to liver cirrhosis Results for RIKER, COLLIER" (MRN [id number]) as of 08/14/2021 08:41  Ref. Range 07/28/2021 15:10 08/11/2021 05:58 08/12/2021 06:07 08/13/2021 05:30  Platelets Latest Ref Range: 150 - 400 K/uL 74 (L) 88 (L) 83 (L) 73 (L)  -No heparin agents -Stable   Iron deficiency anemia -Continue Fe supplementation -8/31 per patient and wife scheduled for iron infusion on Friday if patient still hospitalized will arrange for iron infusion. Lab Results  Component Value Date   HGB 9.0 (L) 08/17/2021   HGB 8.6 (L) 08/16/2021   HGB 8.6 (L)  08/15/2021   HGB 9.9 (L) 08/14/2021   HGB 8.4 (L) 08/14/2021  ]   DM type II controlled with hyperglycemia -Semglee 30 units bedtime -9/2 Semglee 40 units daily - 9/2 increase NovoLog 15 units qac  - Moderate SSI   Liver cirrhosis -Per wife increased somnolence, currently A/O x4 - Monitor for encephalopathy Lab Results  Component Value Date   INR 1.2 08/17/2021   INR 1.3 (H) 08/16/2021   INR 1.2 08/15/2021    INR 1.3 (H) 07/24/2021   INR 1.3 (H) 07/23/2021    Increased Ammonia Level - Lactulose 20 g daily - Rifaximin 550 mg BID Lab Results  Component Value Date   AMMONIA 49 (H) 08/17/2021   AMMONIA 43 (H) 08/16/2021   AMMONIA 46 (H) 08/15/2021   AMMONIA 49 (H) 08/14/2021   AMMONIA 81 (H) 08/12/2021     OSA -CPAP at night  Obese (BMI 37.67 kg/m) -   DVT prophylaxis: SCD Code Status: Full Family Communication: 9/2 discussed at length on phone with patient's wife prognosis  answered all questions.   Status is: Inpatient  Remains inpatient appropriate because:Inpatient level of care appropriate due to severity of illness  Dispo: The patient is from: Home              Anticipated d/c is to: SNF?              Anticipated d/c date is: 3 days              Patient currently is not medically stable to d/c.      Consultants:    Procedures/Significant Events:     I have personally reviewed and interpreted all radiology studies and my findings are as above.  VENTILATOR SETTINGS:    Cultures   Antimicrobials:    Devices    LINES / TUBES:      Continuous Infusions:   Objective: Vitals:   08/16/21 0500 08/16/21 2057 08/16/21 2318 08/17/21 0513  BP:  113/68  113/70  Pulse:  74 73 81  Resp:  18 20 18   Temp:  98.3 F (36.8 C)  98.5 F (36.9 C)  TempSrc:  Oral  Oral  SpO2:  99% 98% 100%  Weight: 117.1 kg     Height:        Intake/Output Summary (Last 24 hours) at 08/17/2021 1532 Last data filed at 08/17/2021 0915 Gross per 24 hour  Intake 240 ml  Output 900 ml  Net -660 ml    Filed Weights   08/14/21 0700 08/15/21 0500 08/16/21 0500  Weight: 121.7 kg 117.6 kg 117.1 kg    Examination:  General: A/O x4, No acute respiratory distress Eyes: negative scleral hemorrhage, negative anisocoria, negative icterus ENT: Negative Runny nose, negative gingival bleeding, Neck:  Negative scars, masses, torticollis, lymphadenopathy, JVD Lungs: Clear to  auscultation bilaterally without wheezes or crackles Cardiovascular: Regular rate and rhythm without murmur gallop or rub normal S1 and S2 Abdomen: OBESE, negative abdominal pain, nondistended, positive soft, bowel sounds, no rebound, no ascites, no appreciable mass Extremities: No significant cyanosis, clubbing, or edema bilateral lower extremities Skin: Negative rashes, lesions, ulcers Psychiatric:  Negative depression, negative anxiety, negative fatigue, negative mania  Central nervous system:  Cranial nerves II through XII intact, tongue/uvula midline, all extremities muscle strength 5/5, sensation intact throughout, expressive aphasia, negative receptive aphasia.  .     Data Reviewed: Care during the described time interval was provided by me .  I have reviewed this patient's available  data, including medical history, events of note, physical examination, and all test results as part of my evaluation.   CBC: Recent Labs  Lab 08/13/21 0530 08/14/21 0603 08/14/21 1258 08/15/21 0414 08/16/21 0605 08/17/21 0443  WBC 4.3 3.8* 4.1 3.8* 3.6* 3.3*  NEUTROABS 2.7 2.5  --  2.4 2.1 2.0  HGB 9.2* 8.4* 9.9* 8.6* 8.6* 9.0*  HCT 30.9* 28.3* 33.6* 29.4* 29.3* 30.3*  MCV 97.8 98.3 99.1 99.7 99.0 97.1  PLT 73* 72* 78* 90* 92* 109*    Basic Metabolic Panel: Recent Labs  Lab 08/13/21 0530 08/14/21 0603 08/15/21 0414 08/16/21 0605 08/17/21 0443  NA 133* 135 138 134* 136  K 3.7 4.1 3.9 4.0 4.1  CL 96* 99 102 104 104  CO2 29 29 27 26 26   GLUCOSE 187* 284* 154* 104* 114*  BUN 19 17 15 14 13   CREATININE 1.14 1.20 1.12 1.09 1.07  CALCIUM 8.5* 8.3* 8.7* 8.0* 8.6*  MG 2.1 2.2 2.2 2.0 2.1  PHOS 1.9* 2.8 2.5 3.0 2.9    GFR: Estimated Creatinine Clearance: 92.9 mL/min (by C-G formula based on SCr of 1.07 mg/dL). Liver Function Tests: Recent Labs  Lab 08/13/21 0530 08/14/21 0603 08/15/21 0414 08/16/21 0605 08/17/21 0443  AST 58* 44* 37 29 31  ALT 14 16 14 11 12   ALKPHOS 67 64 65  60 72  BILITOT 2.1* 1.6* 1.5* 1.5* 1.4*  PROT 6.6 6.3* 6.6 6.4* 6.5  ALBUMIN 2.9* 2.7* 2.8* 2.6* 2.6*    No results for input(s): LIPASE, AMYLASE in the last 168 hours. Recent Labs  Lab 08/12/21 0607 08/14/21 0924 08/15/21 0414 08/16/21 0605 08/17/21 0443  AMMONIA 81* 49* 46* 43* 49*    Coagulation Profile: Recent Labs  Lab 08/15/21 0414 08/16/21 0605 08/17/21 0443  INR 1.2 1.3* 1.2    Cardiac Enzymes: No results for input(s): CKTOTAL, CKMB, CKMBINDEX, TROPONINI in the last 168 hours. BNP (last 3 results) No results for input(s): PROBNP in the last 8760 hours. HbA1C: No results for input(s): HGBA1C in the last 72 hours. CBG: Recent Labs  Lab 08/16/21 1156 08/16/21 1710 08/16/21 2101 08/17/21 0755 08/17/21 1139  GLUCAP 190* 198* 179* 93 156*    Lipid Profile: No results for input(s): CHOL, HDL, LDLCALC, TRIG, CHOLHDL, LDLDIRECT in the last 72 hours. Thyroid Function Tests: No results for input(s): TSH, T4TOTAL, FREET4, T3FREE, THYROIDAB in the last 72 hours. Anemia Panel: No results for input(s): VITAMINB12, FOLATE, FERRITIN, TIBC, IRON, RETICCTPCT in the last 72 hours. Urine analysis:    Component Value Date/Time   COLORURINE YELLOW 08/11/2021 1420   APPEARANCEUR CLEAR 08/11/2021 1420   LABSPEC 1.009 08/11/2021 1420   PHURINE 7.0 08/11/2021 1420   GLUCOSEU NEGATIVE 08/11/2021 1420   HGBUR NEGATIVE 08/11/2021 1420   BILIRUBINUR NEGATIVE 08/11/2021 1420   KETONESUR NEGATIVE 08/11/2021 1420   PROTEINUR NEGATIVE 08/11/2021 1420   UROBILINOGEN 1.0 08/28/2015 1634   NITRITE NEGATIVE 08/11/2021 1420   LEUKOCYTESUR NEGATIVE 08/11/2021 1420   Sepsis Labs: @LABRCNTIP (procalcitonin:4,lacticidven:4)  ) Recent Results (from the past 240 hour(s))  Resp Panel by RT-PCR (Flu A&B, Covid) Nasopharyngeal Swab     Status: None   Collection Time: 08/11/21  7:09 AM   Specimen: Nasopharyngeal Swab; Nasopharyngeal(NP) swabs in vial transport medium  Result Value Ref  Range Status   SARS Coronavirus 2 by RT PCR NEGATIVE NEGATIVE Final    Comment: (NOTE) SARS-CoV-2 target nucleic acids are NOT DETECTED.  The SARS-CoV-2 RNA is generally detectable in upper respiratory specimens during  the acute phase of infection. The lowest concentration of SARS-CoV-2 viral copies this assay can detect is 138 copies/mL. A negative result does not preclude SARS-Cov-2 infection and should not be used as the sole basis for treatment or other patient management decisions. A negative result may occur with  improper specimen collection/handling, submission of specimen other than nasopharyngeal swab, presence of viral mutation(s) within the areas targeted by this assay, and inadequate number of viral copies(<138 copies/mL). A negative result must be combined with clinical observations, patient history, and epidemiological information. The expected result is Negative.  Fact Sheet for Patients:  EntrepreneurPulse.com.au  Fact Sheet for Healthcare Providers:  IncredibleEmployment.be  This test is no t yet approved or cleared by the [place] FDA and  has been authorized for detection and/or diagnosis of SARS-CoV-2 by FDA under an Emergency Use Authorization (EUA). This EUA will remain  in effect (meaning this test can be used) for the duration of the COVID-19 declaration under Section 564(b)(1) of the Act, 21 U.S.C.section 360bbb-3(b)(1), unless the authorization is terminated  or revoked sooner.       Influenza A by PCR NEGATIVE NEGATIVE Final   Influenza B by PCR NEGATIVE NEGATIVE Final    Comment: (NOTE) The Xpert Xpress SARS-CoV-2/FLU/RSV plus assay is intended as an aid in the diagnosis of influenza from Nasopharyngeal swab specimens and should not be used as a sole basis for treatment. Nasal washings and aspirates are unacceptable for Xpert Xpress SARS-CoV-2/FLU/RSV testing.  Fact Sheet for  Patients: EntrepreneurPulse.com.au  Fact Sheet for Healthcare Providers: IncredibleEmployment.be  This test is not yet approved or cleared by the [place] FDA and has been authorized for detection and/or diagnosis of SARS-CoV-2 by FDA under an Emergency Use Authorization (EUA). This EUA will remain in effect (meaning this test can be used) for the duration of the COVID-19 declaration under Section 564(b)(1) of the Act, 21 U.S.C. section 360bbb-3(b)(1), unless the authorization is terminated or revoked.  Performed at [facility], [address] [place],  [address]          Radiology Studies: No results found.      Scheduled Meds:  aspirin EC  81 mg Oral QHS   ferrous sulfate  325 mg Oral q AM   insulin aspart  0-15 Units Subcutaneous TID WC   insulin aspart  0-5 Units Subcutaneous QHS   insulin aspart  15 Units Subcutaneous TID AC   insulin glargine-yfgn  30 Units Subcutaneous QHS   insulin glargine-yfgn  40 Units Subcutaneous Daily   lactulose  20 g Oral Daily   lidocaine  4 patch Transdermal Q24H   magnesium oxide  400 mg Oral Daily   midodrine  10 mg Oral TID WC   nadolol  20 mg Oral Daily   pantoprazole  40 mg Oral BID AC   potassium chloride SA  20 mEq Oral BID   pravastatin  20 mg Oral q1800   rifaximin  550 mg Oral BID   venlafaxine XR  37.5 mg Oral Daily   Continuous Infusions:   LOS: 2 days   The patient is critically ill with multiple organ systems failure and requires high complexity decision making for assessment and support, frequent evaluation and titration of therapies, application of advanced monitoring technologies and extensive interpretation of multiple databases. Critical Care Time devoted to patient care services described in this note  Time spent: 40 minutes     Davene Jobin, Geraldo Docker, MD [facility]   If 7PM-7AM, please contact  night-coverage 08/17/2021, 3:32 PM

## 2021-08-18 ENCOUNTER — Encounter (HOSPITAL_COMMUNITY): Payer: Self-pay | Admitting: Internal Medicine

## 2021-08-18 LAB — CBC WITH DIFFERENTIAL/PLATELET
Abs Immature Granulocytes: 0.01 10*3/uL (ref 0.00–0.07)
Basophils Absolute: 0 10*3/uL (ref 0.0–0.1)
Basophils Relative: 1 %
Eosinophils Absolute: 0.1 10*3/uL (ref 0.0–0.5)
Eosinophils Relative: 2 %
HCT: 27.6 % — ABNORMAL LOW (ref 39.0–52.0)
Hemoglobin: 8.5 g/dL — ABNORMAL LOW (ref 13.0–17.0)
Immature Granulocytes: 0 %
Lymphocytes Relative: 24 %
Lymphs Abs: 0.8 10*3/uL (ref 0.7–4.0)
MCH: 29.6 pg (ref 26.0–34.0)
MCHC: 30.8 g/dL (ref 30.0–36.0)
MCV: 96.2 fL (ref 80.0–100.0)
Monocytes Absolute: 0.5 10*3/uL (ref 0.1–1.0)
Monocytes Relative: 14 %
Neutro Abs: 2 10*3/uL (ref 1.7–7.7)
Neutrophils Relative %: 59 %
Platelets: 105 10*3/uL — ABNORMAL LOW (ref 150–400)
RBC: 2.87 MIL/uL — ABNORMAL LOW (ref 4.22–5.81)
RDW: 18.9 % — ABNORMAL HIGH (ref 11.5–15.5)
WBC: 3.3 10*3/uL — ABNORMAL LOW (ref 4.0–10.5)
nRBC: 0 % (ref 0.0–0.2)

## 2021-08-18 LAB — COMPREHENSIVE METABOLIC PANEL
ALT: 11 U/L (ref 0–44)
AST: 27 U/L (ref 15–41)
Albumin: 2.5 g/dL — ABNORMAL LOW (ref 3.5–5.0)
Alkaline Phosphatase: 68 U/L (ref 38–126)
Anion gap: 5 (ref 5–15)
BUN: 13 mg/dL (ref 8–23)
CO2: 23 mmol/L (ref 22–32)
Calcium: 8.4 mg/dL — ABNORMAL LOW (ref 8.9–10.3)
Chloride: 106 mmol/L (ref 98–111)
Creatinine, Ser: 1.09 mg/dL (ref 0.61–1.24)
GFR, Estimated: >60 mL/min (ref >60)
Glucose, Bld: 145 mg/dL — ABNORMAL HIGH (ref 70–99)
Potassium: 4.3 mmol/L (ref 3.5–5.1)
Sodium: 134 mmol/L — ABNORMAL LOW (ref 135–145)
Total Bilirubin: 1.2 mg/dL (ref 0.3–1.2)
Total Protein: 6.2 g/dL — ABNORMAL LOW (ref 6.5–8.1)

## 2021-08-18 LAB — GLUCOSE, CAPILLARY
Glucose-Capillary: 127 mg/dL — ABNORMAL HIGH (ref 70–99)
Glucose-Capillary: 193 mg/dL — ABNORMAL HIGH (ref 70–99)
Glucose-Capillary: 206 mg/dL — ABNORMAL HIGH (ref 70–99)
Glucose-Capillary: 188 mg/dL — ABNORMAL HIGH (ref 70–99)

## 2021-08-18 LAB — MAGNESIUM: Magnesium: 2 mg/dL (ref 1.7–2.4)

## 2021-08-18 LAB — PROTIME-INR
INR: 1.3 — ABNORMAL HIGH (ref 0.8–1.2)
Prothrombin Time: 15.8 seconds — ABNORMAL HIGH (ref 11.4–15.2)

## 2021-08-18 LAB — AMMONIA: Ammonia: 72 umol/L — ABNORMAL HIGH (ref 9–35)

## 2021-08-18 LAB — PHOSPHORUS: Phosphorus: 3 mg/dL (ref 2.5–4.6)

## 2021-08-18 NOTE — Progress Notes (Signed)
PROGRESS NOTE    [name]  PZW:[id number] DOB: 09/13/1961 DOA: 08/11/2021 PCP: [name], MD   Brief Narrative:  [name] is a 60 y.o. WM PMHx CAD with prior CABG, cirrhosis, hypertension, dyslipidemia, OSA on CPAP, thrombocytopenia, and type 2 diabetes   Presented to the ED after falling at home.  He states that he was using his walker when he tripped over a rug.  He is unsure of losing consciousness after hitting his head, but he did have difficulty getting off the floor due to low back pain.  He denies any other recent events and states that he lives with his wife at home and takes his medications as prescribed.  Wife states that he has been getting more deconditioned lately and has been falling more frequently.   ED Course: Stable vital signs noted and patient is afebrile.  Potassium is 2.4, BUN 31, creatinine 1.53, platelets 88,000, sodium 131, and ammonia 42.  EKG with no significant changes identified.  He has been given lactulose enema in the ED as well as some potassium supplementation.   Subjective: 9/6 afebrile overnight A/O x4, patient states he has been out of the bed today but not sure that he has been up and ambulating as ordered.   Assessment & Plan:  Covid vaccination; vaccinated 3/3  Active Problems:   Essential hypertension   OSA on CPAP   Esophageal varices (HCC)   Liver failure (HCC)   Unspecified cirrhosis of liver (HCC)   Type 2 diabetes mellitus with hyperglycemia (HCC)   Hypotension   Chronic diastolic heart failure (HCC)   Hyperlipidemia associated with type 2 diabetes mellitus (HCC)   Back pain   Anemia   Obstructive sleep apnea (adult) (pediatric)   Hypokalemia   Muscle spasm   CAD (coronary artery disease)   HLD (hyperlipidemia)   Chronic idiopathic thrombocytopenia (HCC)   Increased ammonia level   Obese   Fall at home, initial encounter   Hypotension due to hypovolemia   Fall/ -PT evaluation with possible need for  placement due to frequent falls at home -Fall precautions -No signs of injury on imaging -9/1 PT recommends SNF -9/3 ambulate patient every shift  Hypotension -8/21 albumin 12.5 g x 1 - 9/1 increase Midodrine 10 mg TID  Muscle spasm -Bilateral oblique muscle spasm LEFT>> RIGHT - Robaxin 500 mg BID -9/1 Lidoderm patch to lower back every 12 hours   AKI-likely prerenal (baseline Cr 1.2) -Appears dehydrated -IVF -Avoid nephrotoxic agents -Strict in and out +680.49ml - Daily weight Lab Results  Component Value Date   CREATININE 1.09 08/18/2021   CREATININE 1.07 08/17/2021   CREATININE 1.09 08/16/2021   CREATININE 1.12 08/15/2021   CREATININE 1.20 08/14/2021  - 9/2 resolved    Hypokalemia -Resolved  Hyponatremia - Most likely secondary to his liver failure -9/1 see hypophosphatemia  Hypophosphatemia - Phosphorus goal> 0.5 - 9/1 sodium phosphate 20 mmol   CAD/HTN/HLD -Prior CABG -Continue home medications   Chronic thrombocytopenia -Likely related to liver cirrhosis Results for GORDAN, GRELL" (MRN [id number]) as of 08/14/2021 08:41  Ref. Range 07/28/2021 15:10 08/11/2021 05:58 08/12/2021 06:07 08/13/2021 05:30  Platelets Latest Ref Range: 150 - 400 K/uL 74 (L) 88 (L) 83 (L) 73 (L)  -No heparin agents -Stable   Iron deficiency anemia -Continue Fe supplementation -8/31 per patient and wife scheduled for iron infusion on Friday if patient still hospitalized will arrange for iron infusion. Lab Results  Component Value Date   HGB  8.5 (L) 08/18/2021   HGB 9.0 (L) 08/17/2021   HGB 8.6 (L) 08/16/2021   HGB 8.6 (L) 08/15/2021   HGB 9.9 (L) 08/14/2021     DM type II controlled with hyperglycemia -Semglee 30 units bedtime -9/2 Semglee 40 units daily - 9/2 increase NovoLog 15 units qac  - Moderate SSI   Liver cirrhosis -Per wife increased somnolence, currently A/O x4 - Monitor for encephalopathy Lab Results  Component Value Date   INR 1.3 (H) 08/18/2021    INR 1.2 08/17/2021   INR 1.3 (H) 08/16/2021   INR 1.2 08/15/2021   INR 1.3 (H) 07/24/2021    Increased Ammonia Level - Lactulose 20 g daily - Rifaximin 550 mg BID Lab Results  Component Value Date   AMMONIA 72 (H) 08/18/2021   AMMONIA 49 (H) 08/17/2021   AMMONIA 43 (H) 08/16/2021   AMMONIA 46 (H) 08/15/2021   AMMONIA 49 (H) 08/14/2021     OSA -CPAP at night  Obese (BMI 37.67 kg/m) -  Goals of care - 9/6 nursing orders  1..  Ensure the blinds are open during the daytime at all times  2.  Ensure all lights are on during the daytime 3.  Ensure patient ambulates in the hallway every shift 4.  Ensure patient takes all meals sitting in the chair These are nonnegotiable    DVT prophylaxis: SCD Code Status: Full Family Communication: 9/2 discussed at length on phone with patient's wife prognosis  answered all questions.   Status is: Inpatient  Remains inpatient appropriate because:Inpatient level of care appropriate due to severity of illness  Dispo: The patient is from: Home              Anticipated d/c is to: SNF?              Anticipated d/c date is: 3 days              Patient currently is not medically stable to d/c.      Consultants:    Procedures/Significant Events:     I have personally reviewed and interpreted all radiology studies and my findings are as above.  VENTILATOR SETTINGS:    Cultures   Antimicrobials:    Devices    LINES / TUBES:      Continuous Infusions:   Objective: Vitals:   08/16/21 2318 08/17/21 0513 08/17/21 2057 08/18/21 0602  BP:  113/70 113/66 100/64  Pulse: 73 81 72 76  Resp: 20 18 19 19   Temp:  98.5 F (36.9 C) 98.3 F (36.8 C) 98.2 F (36.8 C)  TempSrc:  Oral Oral Oral  SpO2: 98% 100% 100% 99%  Weight:    122.6 kg  Height:        Intake/Output Summary (Last 24 hours) at 08/18/2021 0736 Last data filed at 08/17/2021 1500 Gross per 24 hour  Intake 480 ml  Output --  Net 480 ml    Filed  Weights   08/15/21 0500 08/16/21 0500 08/18/21 0602  Weight: 117.6 kg 117.1 kg 122.6 kg    Examination:  General: A/O x4, No acute respiratory distress Eyes: negative scleral hemorrhage, negative anisocoria, negative icterus ENT: Negative Runny nose, negative gingival bleeding, Neck:  Negative scars, masses, torticollis, lymphadenopathy, JVD Lungs: Clear to auscultation bilaterally without wheezes or crackles Cardiovascular: Regular rate and rhythm without murmur gallop or rub normal S1 and S2 Abdomen: OBESE, negative abdominal pain, nondistended, positive soft, bowel sounds, no rebound, no ascites, no appreciable mass Extremities: No significant  cyanosis, clubbing, or edema bilateral lower extremities Skin: Negative rashes, lesions, ulcers Psychiatric:  Negative depression, negative anxiety, negative fatigue, negative mania  Central nervous system:  Cranial nerves II through XII intact, tongue/uvula midline, all extremities muscle strength 5/5, sensation intact throughout, expressive aphasia, negative receptive aphasia.  .     Data Reviewed: Care during the described time interval was provided by me .  I have reviewed this patient's available data, including medical history, events of note, physical examination, and all test results as part of my evaluation.   CBC: Recent Labs  Lab 08/14/21 0603 08/14/21 1258 08/15/21 0414 08/16/21 0605 08/17/21 0443 08/18/21 0532  WBC 3.8* 4.1 3.8* 3.6* 3.3* 3.3*  NEUTROABS 2.5  --  2.4 2.1 2.0 2.0  HGB 8.4* 9.9* 8.6* 8.6* 9.0* 8.5*  HCT 28.3* 33.6* 29.4* 29.3* 30.3* 27.6*  MCV 98.3 99.1 99.7 99.0 97.1 96.2  PLT 72* 78* 90* 92* 109* 105*    Basic Metabolic Panel: Recent Labs  Lab 08/14/21 0603 08/15/21 0414 08/16/21 0605 08/17/21 0443 08/18/21 0532  NA 135 138 134* 136 134*  K 4.1 3.9 4.0 4.1 4.3  CL 99 102 104 104 106  CO2 29 27 26 26 23   GLUCOSE 284* 154* 104* 114* 145*  BUN 17 15 14 13 13   CREATININE 1.20 1.12 1.09 1.07  1.09  CALCIUM 8.3* 8.7* 8.0* 8.6* 8.4*  MG 2.2 2.2 2.0 2.1 2.0  PHOS 2.8 2.5 3.0 2.9 3.0    GFR: Estimated Creatinine Clearance: 93.4 mL/min (by C-G formula based on SCr of 1.09 mg/dL). Liver Function Tests: Recent Labs  Lab 08/14/21 0603 08/15/21 0414 08/16/21 0605 08/17/21 0443 08/18/21 0532  AST 44* 37 29 31 27   ALT 16 14 11 12 11   ALKPHOS 64 65 60 72 68  BILITOT 1.6* 1.5* 1.5* 1.4* 1.2  PROT 6.3* 6.6 6.4* 6.5 6.2*  ALBUMIN 2.7* 2.8* 2.6* 2.6* 2.5*    No results for input(s): LIPASE, AMYLASE in the last 168 hours. Recent Labs  Lab 08/14/21 0924 08/15/21 0414 08/16/21 0605 08/17/21 0443 08/18/21 0532  AMMONIA 49* 46* 43* 49* 72*    Coagulation Profile: Recent Labs  Lab 08/15/21 0414 08/16/21 0605 08/17/21 0443 08/18/21 0532  INR 1.2 1.3* 1.2 1.3*    Cardiac Enzymes: No results for input(s): CKTOTAL, CKMB, CKMBINDEX, TROPONINI in the last 168 hours. BNP (last 3 results) No results for input(s): PROBNP in the last 8760 hours. HbA1C: No results for input(s): HGBA1C in the last 72 hours. CBG: Recent Labs  Lab 08/17/21 0755 08/17/21 1139 08/17/21 1621 08/17/21 2100 08/18/21 0710  GLUCAP 93 156* 212* 263* 127*    Lipid Profile: No results for input(s): CHOL, HDL, LDLCALC, TRIG, CHOLHDL, LDLDIRECT in the last 72 hours. Thyroid Function Tests: No results for input(s): TSH, T4TOTAL, FREET4, T3FREE, THYROIDAB in the last 72 hours. Anemia Panel: No results for input(s): VITAMINB12, FOLATE, FERRITIN, TIBC, IRON, RETICCTPCT in the last 72 hours. Urine analysis:    Component Value Date/Time   COLORURINE YELLOW 08/11/2021 1420   APPEARANCEUR CLEAR 08/11/2021 1420   LABSPEC 1.009 08/11/2021 1420   PHURINE 7.0 08/11/2021 1420   GLUCOSEU NEGATIVE 08/11/2021 1420   HGBUR NEGATIVE 08/11/2021 1420   BILIRUBINUR NEGATIVE 08/11/2021 1420   [place] 08/11/2021 1420   PROTEINUR NEGATIVE 08/11/2021 1420   UROBILINOGEN 1.0 08/28/2015 1634   NITRITE  NEGATIVE 08/11/2021 1420   LEUKOCYTESUR NEGATIVE 08/11/2021 1420   Sepsis Labs: @LABRCNTIP (procalcitonin:4,lacticidven:4)  ) Recent Results (from the past 240  hour(s))  Resp Panel by RT-PCR (Flu A&B, Covid) Nasopharyngeal Swab     Status: None   Collection Time: 08/11/21  7:09 AM   Specimen: Nasopharyngeal Swab; Nasopharyngeal(NP) swabs in vial transport medium  Result Value Ref Range Status   SARS Coronavirus 2 by RT PCR NEGATIVE NEGATIVE Final    Comment: (NOTE) SARS-CoV-2 target nucleic acids are NOT DETECTED.  The SARS-CoV-2 RNA is generally detectable in upper respiratory specimens during the acute phase of infection. The lowest concentration of SARS-CoV-2 viral copies this assay can detect is 138 copies/mL. A negative result does not preclude SARS-Cov-2 infection and should not be used as the sole basis for treatment or other patient management decisions. A negative result may occur with  improper specimen collection/handling, submission of specimen other than nasopharyngeal swab, presence of viral mutation(s) within the areas targeted by this assay, and inadequate number of viral copies(<138 copies/mL). A negative result must be combined with clinical observations, patient history, and epidemiological information. The expected result is Negative.  Fact Sheet for Patients:  EntrepreneurPulse.com.au  Fact Sheet for Healthcare Providers:  IncredibleEmployment.be  This test is no t yet approved or cleared by the [place] FDA and  has been authorized for detection and/or diagnosis of SARS-CoV-2 by FDA under an Emergency Use Authorization (EUA). This EUA will remain  in effect (meaning this test can be used) for the duration of the COVID-19 declaration under Section 564(b)(1) of the Act, 21 U.S.C.section 360bbb-3(b)(1), unless the authorization is terminated  or revoked sooner.       Influenza A by PCR NEGATIVE NEGATIVE Final    Influenza B by PCR NEGATIVE NEGATIVE Final    Comment: (NOTE) The Xpert Xpress SARS-CoV-2/FLU/RSV plus assay is intended as an aid in the diagnosis of influenza from Nasopharyngeal swab specimens and should not be used as a sole basis for treatment. Nasal washings and aspirates are unacceptable for Xpert Xpress SARS-CoV-2/FLU/RSV testing.  Fact Sheet for Patients: EntrepreneurPulse.com.au  Fact Sheet for Healthcare Providers: IncredibleEmployment.be  This test is not yet approved or cleared by the [place] FDA and has been authorized for detection and/or diagnosis of SARS-CoV-2 by FDA under an Emergency Use Authorization (EUA). This EUA will remain in effect (meaning this test can be used) for the duration of the COVID-19 declaration under Section 564(b)(1) of the Act, 21 U.S.C. section 360bbb-3(b)(1), unless the authorization is terminated or revoked.  Performed at [facility], [address], [place], [place] [address]          Radiology Studies: No results found.      Scheduled Meds:  aspirin EC  81 mg Oral QHS   ferrous sulfate  325 mg Oral q AM   insulin aspart  0-15 Units Subcutaneous TID WC   insulin aspart  0-5 Units Subcutaneous QHS   insulin aspart  15 Units Subcutaneous TID AC   insulin glargine-yfgn  30 Units Subcutaneous QHS   insulin glargine-yfgn  40 Units Subcutaneous Daily   lactulose  20 g Oral Daily   lidocaine  4 patch Transdermal Q24H   magnesium oxide  400 mg Oral Daily   midodrine  10 mg Oral TID WC   nadolol  20 mg Oral Daily   pantoprazole  40 mg Oral BID AC   potassium chloride SA  20 mEq Oral BID   pravastatin  20 mg Oral q1800   rifaximin  550 mg Oral BID   venlafaxine XR  37.5 mg Oral Daily   Continuous Infusions:  LOS: 3 days   The patient is critically ill with multiple organ systems failure and requires high complexity decision making for assessment and support, frequent evaluation and  titration of therapies, application of advanced monitoring technologies and extensive interpretation of multiple databases. Critical Care Time devoted to patient care services described in this note  Time spent: 40 minutes     Madaline Lefeber, Geraldo Docker, MD [facility]   If 7PM-7AM, please contact night-coverage 08/18/2021, 7:36 AM

## 2021-08-18 NOTE — Progress Notes (Signed)
Patient refused CPAP again tonight; unit pulled out of room, circuit left in room in case he changes his mind.

## 2021-08-18 NOTE — Progress Notes (Signed)
Physical Therapy Note  Patient Details  Name: [name] MRN: [id number] Date of Birth: 20-May-1961 Today's Date: 08/18/2021    Pt refused therapy today stating he had been up several times today ambulating and wishes to rest this evening.   Teena Irani, PTA/CLT [place], [place] 08/18/2021, 4:39 PM

## 2021-08-19 LAB — COMPREHENSIVE METABOLIC PANEL
ALT: 10 U/L (ref 0–44)
AST: 24 U/L (ref 15–41)
Albumin: 2.5 g/dL — ABNORMAL LOW (ref 3.5–5.0)
Alkaline Phosphatase: 67 U/L (ref 38–126)
Anion gap: 5 (ref 5–15)
BUN: 12 mg/dL (ref 8–23)
CO2: 25 mmol/L (ref 22–32)
Calcium: 8.4 mg/dL — ABNORMAL LOW (ref 8.9–10.3)
Chloride: 106 mmol/L (ref 98–111)
Creatinine, Ser: 1.14 mg/dL (ref 0.61–1.24)
GFR, Estimated: >60 mL/min (ref >60)
Glucose, Bld: 100 mg/dL — ABNORMAL HIGH (ref 70–99)
Potassium: 4.1 mmol/L (ref 3.5–5.1)
Sodium: 136 mmol/L (ref 135–145)
Total Bilirubin: 1.1 mg/dL (ref 0.3–1.2)
Total Protein: 5.9 g/dL — ABNORMAL LOW (ref 6.5–8.1)

## 2021-08-19 LAB — CBC WITH DIFFERENTIAL/PLATELET
Abs Immature Granulocytes: 0.02 10*3/uL (ref 0.00–0.07)
Basophils Absolute: 0 10*3/uL (ref 0.0–0.1)
Basophils Relative: 1 %
Eosinophils Absolute: 0.1 10*3/uL (ref 0.0–0.5)
Eosinophils Relative: 3 %
HCT: 28.4 % — ABNORMAL LOW (ref 39.0–52.0)
Hemoglobin: 8.4 g/dL — ABNORMAL LOW (ref 13.0–17.0)
Immature Granulocytes: 1 %
Lymphocytes Relative: 26 %
Lymphs Abs: 0.9 10*3/uL (ref 0.7–4.0)
MCH: 28.7 pg (ref 26.0–34.0)
MCHC: 29.6 g/dL — ABNORMAL LOW (ref 30.0–36.0)
MCV: 96.9 fL (ref 80.0–100.0)
Monocytes Absolute: 0.5 10*3/uL (ref 0.1–1.0)
Monocytes Relative: 14 %
Neutro Abs: 2 10*3/uL (ref 1.7–7.7)
Neutrophils Relative %: 55 %
Platelets: 112 10*3/uL — ABNORMAL LOW (ref 150–400)
RBC: 2.93 MIL/uL — ABNORMAL LOW (ref 4.22–5.81)
RDW: 18.6 % — ABNORMAL HIGH (ref 11.5–15.5)
WBC: 3.6 10*3/uL — ABNORMAL LOW (ref 4.0–10.5)
nRBC: 0 % (ref 0.0–0.2)

## 2021-08-19 LAB — MAGNESIUM: Magnesium: 2 mg/dL (ref 1.7–2.4)

## 2021-08-19 LAB — GLUCOSE, CAPILLARY
Glucose-Capillary: 80 mg/dL (ref 70–99)
Glucose-Capillary: 172 mg/dL — ABNORMAL HIGH (ref 70–99)
Glucose-Capillary: 131 mg/dL — ABNORMAL HIGH (ref 70–99)
Glucose-Capillary: 165 mg/dL — ABNORMAL HIGH (ref 70–99)

## 2021-08-19 LAB — AMMONIA: Ammonia: 43 umol/L — ABNORMAL HIGH (ref 9–35)

## 2021-08-19 LAB — PROTIME-INR
INR: 1.3 — ABNORMAL HIGH (ref 0.8–1.2)
Prothrombin Time: 15.9 seconds — ABNORMAL HIGH (ref 11.4–15.2)

## 2021-08-19 LAB — PHOSPHORUS: Phosphorus: 2.8 mg/dL (ref 2.5–4.6)

## 2021-08-19 NOTE — Progress Notes (Signed)
PROGRESS NOTE    [name]  EAV:[id number] DOB: [birth year] DOA: 08/11/2021 PCP: [name], MD   Brief Narrative:   60 y.o. WM PMHx CAD with prior CABG, cirrhosis, hypertension, dyslipidemia, OSA on CPAP, thrombocytopenia, and type 2 diabetes comes to the hospital with complains with fall.  Found to have kalemia, elevated ammonia and thrombocytopenia.  His symptoms started improving with IV fluids, electrolyte supplementation.  PT/OT recommended SNF therefore arrangements been made.  Assessment & Plan:   Active Problems:   Essential hypertension   OSA on CPAP   Esophageal varices (HCC)   Liver failure (HCC)   Unspecified cirrhosis of liver (HCC)   Type 2 diabetes mellitus with hyperglycemia (HCC)   Hypotension   Chronic diastolic heart failure (HCC)   Hyperlipidemia associated with type 2 diabetes mellitus (HCC)   Back pain   Anemia   Obstructive sleep apnea (adult) (pediatric)   Hypokalemia   Muscle spasm   CAD (coronary artery disease)   HLD (hyperlipidemia)   Chronic idiopathic thrombocytopenia (HCC)   Increased ammonia level   Obese   Fall at home, initial encounter   Hypotension due to hypovolemia   Generalized weakness and falls Hypotension, improved - Secondary to electrolyte imbalance and dehydration.  This is improved with IV fluids.  Currently on midodrine.  Also required albumin.  Electrolytes have improved.  PT/OT has recommended SNF.  Chronic thrombocytopenia - Secondary to underlying liver disease.  Platelets have improved today 112 K  CAD status post CABG - Resume home meds.  Currently chest pain-free  Cirrhosis of the liver; NASH Hx of EV s/p EVL 07/2021 - Mild off-and-on hepatic encephalopathy.  On daily lactulose, rifaximin  Diabetes mellitus type 2 with hyperglycemia - Continue current regimen.  Sliding scale Accu-Cheks  OSA - Nightly CPAP    DVT prophylaxis: SCDs Code Status: Full code Family Communication:    Status is:  Inpatient  Remains inpatient appropriate because:Inpatient level of care appropriate due to severity of illness. Pending SNF  Dispo: The patient is from: Home              Anticipated d/c is to: SNF              Patient currently is medically stable to d/c. TOC workjing on placement   Difficult to place patient No     Subjective: Feels great no complaints this morning.  Overall does feel very weak wants to go to rehab  Review of Systems Otherwise negative except as per HPI, including: General: Denies fever, chills, night sweats or unintended weight loss. Resp: Denies cough, wheezing, shortness of breath. Cardiac: Denies chest pain, palpitations, orthopnea, paroxysmal nocturnal dyspnea. GI: Denies abdominal pain, nausea, vomiting, diarrhea or constipation GU: Denies dysuria, frequency, hesitancy or incontinence MS: Denies muscle aches, joint pain or swelling Neuro: Denies headache, neurologic deficits (focal weakness, numbness, tingling), abnormal gait Psych: Denies anxiety, depression, SI/HI/AVH Skin: Denies new rashes or lesions ID: Denies sick contacts, exotic exposures, travel  Examination:  General exam: Appears calm and comfortable  Respiratory system: Clear to auscultation. Respiratory effort normal. Cardiovascular system: S1 & S2 heard, RRR. No JVD, murmurs, rubs, gallops or clicks. No pedal edema. Gastrointestinal system: Abdomen is nondistended, soft and nontender. No organomegaly or masses felt. Normal bowel sounds heard. Central nervous system: Alert and oriented. No focal neurological deficits. Extremities: Symmetric 5 x 5 power. Skin: No rashes, lesions or ulcers Psychiatry: Judgement and insight appear normal. Mood & affect appropriate.  Objective: Vitals:   08/18/21 2048 08/19/21 0406 08/19/21 0500 08/19/21 1429  BP: 119/73 109/68  109/72  Pulse: 67 69  72  Resp: 18 19  20   Temp: 98.1 F (36.7 C) 98.6 F (37 C)  98.6 F (37 C)  TempSrc:    Oral   SpO2: 97% 100%  98%  Weight:   122.7 kg   Height:        Intake/Output Summary (Last 24 hours) at 08/19/2021 1503 Last data filed at 08/19/2021 0500 Gross per 24 hour  Intake --  Output 600 ml  Net -600 ml   Filed Weights   08/16/21 0500 08/18/21 0602 08/19/21 0500  Weight: 117.1 kg 122.6 kg 122.7 kg     Data Reviewed:   CBC: Recent Labs  Lab 08/15/21 0414 08/16/21 0605 08/17/21 0443 08/18/21 0532 08/19/21 0521  WBC 3.8* 3.6* 3.3* 3.3* 3.6*  NEUTROABS 2.4 2.1 2.0 2.0 2.0  HGB 8.6* 8.6* 9.0* 8.5* 8.4*  HCT 29.4* 29.3* 30.3* 27.6* 28.4*  MCV 99.7 99.0 97.1 96.2 96.9  PLT 90* 92* 109* 105* 737*   Basic Metabolic Panel: Recent Labs  Lab 08/15/21 0414 08/16/21 0605 08/17/21 0443 08/18/21 0532 08/19/21 0521  NA 138 134* 136 134* 136  K 3.9 4.0 4.1 4.3 4.1  CL 102 104 104 106 106  CO2 27 26 26 23 25   GLUCOSE 154* 104* 114* 145* 100*  BUN 15 14 13 13 12   CREATININE 1.12 1.09 1.07 1.09 1.14  CALCIUM 8.7* 8.0* 8.6* 8.4* 8.4*  MG 2.2 2.0 2.1 2.0 2.0  PHOS 2.5 3.0 2.9 3.0 2.8   GFR: Estimated Creatinine Clearance: 89.4 mL/min (by C-G formula based on SCr of 1.14 mg/dL). Liver Function Tests: Recent Labs  Lab 08/15/21 0414 08/16/21 0605 08/17/21 0443 08/18/21 0532 08/19/21 0521  AST 37 29 31 27 24   ALT 14 11 12 11 10   ALKPHOS 65 60 72 68 67  BILITOT 1.5* 1.5* 1.4* 1.2 1.1  PROT 6.6 6.4* 6.5 6.2* 5.9*  ALBUMIN 2.8* 2.6* 2.6* 2.5* 2.5*   No results for input(s): LIPASE, AMYLASE in the last 168 hours. Recent Labs  Lab 08/15/21 0414 08/16/21 0605 08/17/21 0443 08/18/21 0532 08/19/21 0521  AMMONIA 46* 43* 49* 72* 43*   Coagulation Profile: Recent Labs  Lab 08/15/21 0414 08/16/21 0605 08/17/21 0443 08/18/21 0532 08/19/21 0521  INR 1.2 1.3* 1.2 1.3* 1.3*   Cardiac Enzymes: No results for input(s): CKTOTAL, CKMB, CKMBINDEX, TROPONINI in the last 168 hours. BNP (last 3 results) No results for input(s): PROBNP in the last 8760 hours. HbA1C: No  results for input(s): HGBA1C in the last 72 hours. CBG: Recent Labs  Lab 08/18/21 1117 08/18/21 1622 08/18/21 2045 08/19/21 0753 08/19/21 1139  GLUCAP 193* 206* 188* 80 172*   Lipid Profile: No results for input(s): CHOL, HDL, LDLCALC, TRIG, CHOLHDL, LDLDIRECT in the last 72 hours. Thyroid Function Tests: No results for input(s): TSH, T4TOTAL, FREET4, T3FREE, THYROIDAB in the last 72 hours. Anemia Panel: No results for input(s): VITAMINB12, FOLATE, FERRITIN, TIBC, IRON, RETICCTPCT in the last 72 hours. Sepsis Labs: No results for input(s): PROCALCITON, LATICACIDVEN in the last 168 hours.  Recent Results (from the past 240 hour(s))  Resp Panel by RT-PCR (Flu A&B, Covid) Nasopharyngeal Swab     Status: None   Collection Time: 08/11/21  7:09 AM   Specimen: Nasopharyngeal Swab; Nasopharyngeal(NP) swabs in vial transport medium  Result Value Ref Range Status   SARS Coronavirus 2 by RT PCR  NEGATIVE NEGATIVE Final    Comment: (NOTE) SARS-CoV-2 target nucleic acids are NOT DETECTED.  The SARS-CoV-2 RNA is generally detectable in upper respiratory specimens during the acute phase of infection. The lowest concentration of SARS-CoV-2 viral copies this assay can detect is 138 copies/mL. A negative result does not preclude SARS-Cov-2 infection and should not be used as the sole basis for treatment or other patient management decisions. A negative result may occur with  improper specimen collection/handling, submission of specimen other than nasopharyngeal swab, presence of viral mutation(s) within the areas targeted by this assay, and inadequate number of viral copies(<138 copies/mL). A negative result must be combined with clinical observations, patient history, and epidemiological information. The expected result is Negative.  Fact Sheet for Patients:  EntrepreneurPulse.com.au  Fact Sheet for Healthcare Providers:  IncredibleEmployment.be  This  test is no t yet approved or cleared by the [place] FDA and  has been authorized for detection and/or diagnosis of SARS-CoV-2 by FDA under an Emergency Use Authorization (EUA). This EUA will remain  in effect (meaning this test can be used) for the duration of the COVID-19 declaration under Section 564(b)(1) of the Act, 21 U.S.C.section 360bbb-3(b)(1), unless the authorization is terminated  or revoked sooner.       Influenza A by PCR NEGATIVE NEGATIVE Final   Influenza B by PCR NEGATIVE NEGATIVE Final    Comment: (NOTE) The Xpert Xpress SARS-CoV-2/FLU/RSV plus assay is intended as an aid in the diagnosis of influenza from Nasopharyngeal swab specimens and should not be used as a sole basis for treatment. Nasal washings and aspirates are unacceptable for Xpert Xpress SARS-CoV-2/FLU/RSV testing.  Fact Sheet for Patients: EntrepreneurPulse.com.au  Fact Sheet for Healthcare Providers: IncredibleEmployment.be  This test is not yet approved or cleared by the [place] FDA and has been authorized for detection and/or diagnosis of SARS-CoV-2 by FDA under an Emergency Use Authorization (EUA). This EUA will remain in effect (meaning this test can be used) for the duration of the COVID-19 declaration under Section 564(b)(1) of the Act, 21 U.S.C. section 360bbb-3(b)(1), unless the authorization is terminated or revoked.  Performed at [facility], [address], [place], [place] [address]          Radiology Studies: No results found.      Scheduled Meds:  aspirin EC  81 mg Oral QHS   ferrous sulfate  325 mg Oral q AM   insulin aspart  0-15 Units Subcutaneous TID WC   insulin aspart  0-5 Units Subcutaneous QHS   insulin aspart  15 Units Subcutaneous TID AC   insulin glargine-yfgn  30 Units Subcutaneous QHS   insulin glargine-yfgn  40 Units Subcutaneous Daily   lactulose  20 g Oral Daily   lidocaine  4 patch Transdermal Q24H    magnesium oxide  400 mg Oral Daily   midodrine  10 mg Oral TID WC   nadolol  20 mg Oral Daily   pantoprazole  40 mg Oral BID AC   potassium chloride SA  20 mEq Oral BID   pravastatin  20 mg Oral q1800   rifaximin  550 mg Oral BID   venlafaxine XR  37.5 mg Oral Daily   Continuous Infusions:   LOS: 4 days   Time spent= 35 mins    Shawan Tosh Arsenio Loader, MD [facility]  If 7PM-7AM, please contact night-coverage  08/19/2021, 3:03 PM

## 2021-08-20 ENCOUNTER — Telehealth: Payer: Self-pay | Admitting: Orthopedic Surgery

## 2021-08-20 ENCOUNTER — Inpatient Hospital Stay (HOSPITAL_COMMUNITY): Payer: Federal, State, Local not specified - PPO

## 2021-08-20 LAB — CBC WITH DIFFERENTIAL/PLATELET
Abs Immature Granulocytes: 0.02 10*3/uL (ref 0.00–0.07)
Basophils Absolute: 0 10*3/uL (ref 0.0–0.1)
Basophils Relative: 1 %
Eosinophils Absolute: 0.1 10*3/uL (ref 0.0–0.5)
Eosinophils Relative: 2 %
HCT: 29.3 % — ABNORMAL LOW (ref 39.0–52.0)
Hemoglobin: 8.8 g/dL — ABNORMAL LOW (ref 13.0–17.0)
Immature Granulocytes: 1 %
Lymphocytes Relative: 24 %
Lymphs Abs: 0.8 10*3/uL (ref 0.7–4.0)
MCH: 29.3 pg (ref 26.0–34.0)
MCHC: 30 g/dL (ref 30.0–36.0)
MCV: 97.7 fL (ref 80.0–100.0)
Monocytes Absolute: 0.4 10*3/uL (ref 0.1–1.0)
Monocytes Relative: 12 %
Neutro Abs: 2.1 10*3/uL (ref 1.7–7.7)
Neutrophils Relative %: 60 %
Platelets: 119 10*3/uL — ABNORMAL LOW (ref 150–400)
RBC: 3 MIL/uL — ABNORMAL LOW (ref 4.22–5.81)
RDW: 18.8 % — ABNORMAL HIGH (ref 11.5–15.5)
WBC: 3.4 10*3/uL — ABNORMAL LOW (ref 4.0–10.5)
nRBC: 0 % (ref 0.0–0.2)

## 2021-08-20 LAB — MAGNESIUM: Magnesium: 1.9 mg/dL (ref 1.7–2.4)

## 2021-08-20 LAB — COMPREHENSIVE METABOLIC PANEL
ALT: 9 U/L (ref 0–44)
AST: 24 U/L (ref 15–41)
Albumin: 2.5 g/dL — ABNORMAL LOW (ref 3.5–5.0)
Alkaline Phosphatase: 72 U/L (ref 38–126)
Anion gap: 6 (ref 5–15)
BUN: 12 mg/dL (ref 8–23)
CO2: 25 mmol/L (ref 22–32)
Calcium: 8.5 mg/dL — ABNORMAL LOW (ref 8.9–10.3)
Chloride: 104 mmol/L (ref 98–111)
Creatinine, Ser: 1.13 mg/dL (ref 0.61–1.24)
GFR, Estimated: >60 mL/min (ref >60)
Glucose, Bld: 105 mg/dL — ABNORMAL HIGH (ref 70–99)
Potassium: 4.1 mmol/L (ref 3.5–5.1)
Sodium: 135 mmol/L (ref 135–145)
Total Bilirubin: 1.1 mg/dL (ref 0.3–1.2)
Total Protein: 6.2 g/dL — ABNORMAL LOW (ref 6.5–8.1)

## 2021-08-20 LAB — GLUCOSE, CAPILLARY
Glucose-Capillary: 103 mg/dL — ABNORMAL HIGH (ref 70–99)
Glucose-Capillary: 215 mg/dL — ABNORMAL HIGH (ref 70–99)
Glucose-Capillary: 220 mg/dL — ABNORMAL HIGH (ref 70–99)
Glucose-Capillary: 262 mg/dL — ABNORMAL HIGH (ref 70–99)

## 2021-08-20 LAB — PROTIME-INR
INR: 1.2 (ref 0.8–1.2)
Prothrombin Time: 15.3 seconds — ABNORMAL HIGH (ref 11.4–15.2)

## 2021-08-20 LAB — AMMONIA: Ammonia: 42 umol/L — ABNORMAL HIGH (ref 9–35)

## 2021-08-20 LAB — PHOSPHORUS: Phosphorus: 2.9 mg/dL (ref 2.5–4.6)

## 2021-08-20 NOTE — Telephone Encounter (Signed)
Called back to patient to relay; mailbox full; unable to leave message; called alternate phone number to leave message, per Dr Harrison's response.

## 2021-08-20 NOTE — Progress Notes (Signed)
Physical Therapy Treatment Patient Details Name: DAVI [name] MRN: [id number] DOB: [birth year] Today's Date: 08/20/2021    History of Present Illness [name] is a 60 y.o. male with medical history significant for CAD with prior CABG, cirrhosis, hypertension, dyslipidemia, OSA on CPAP, thrombocytopenia, and type 2 diabetes who presented to the ED after falling at home.  He states that he was using his walker when he tripped over a rug.  He is unsure of losing consciousness after hitting his head, but he did have difficulty getting off the floor due to low back pain.  He denies any other recent events and states that he lives with his wife at home and takes his medications as prescribed.  Wife states that he has been getting more deconditioned lately and has been falling more frequently.    PT Comments    Patient presents seated in chair (assisted by nursing staff) and agreeable for therapy.  Patient demonstrates fair/good return for completing BLE ROM/strengthening exercises with verbal cues and demonstration, has difficulty completing sit stands due to left knee pain and limited to few steps at bedside before having to sit due to fatigue.  Patient demonstrates good return for moving BLE when getting back into bed.  Patient will benefit from continued physical therapy in hospital and recommended venue below to increase strength, balance, endurance for safe ADLs and gait.     Follow Up Recommendations  SNF     Equipment Recommendations  None recommended by PT    Recommendations for Other Services       Precautions / Restrictions Precautions Precautions: Fall Restrictions Weight Bearing Restrictions: No    Mobility  Bed Mobility Overal bed mobility: Needs Assistance Bed Mobility: Sit to Supine;Rolling Rolling: Min guard Sidelying to sit: Min guard       General bed mobility comments: presents seated in chair (assisted by nursing staff), demonstrates good return  for moving BLE when getting back in bed    Transfers Overall transfer level: Needs assistance Equipment used: Rolling walker (2 wheeled) Transfers: Stand Pivot Transfers;Sit to/from Stand Sit to Stand: Min assist;Mod assist Stand pivot transfers: Min assist       General transfer comment: has diffiuclty with sit to stands due to left knee pain  Ambulation/Gait Ambulation/Gait assistance: Mod assist Gait Distance (Feet): 5 Feet Assistive device: Rolling walker (2 wheeled) Gait Pattern/deviations: Decreased step length - right;Decreased step length - left;Decreased stance time - left;Decreased stride length;Antalgic Gait velocity: decreased   General Gait Details: slow labord side steps without loss of balance, limited mostly due to fatigue and left knee pain   Stairs             Wheelchair Mobility    Modified Rankin (Stroke Patients Only)       Balance Overall balance assessment: Needs assistance Sitting-balance support: Feet supported;No upper extremity supported Sitting balance-Leahy Scale: Fair Sitting balance - Comments: fair/good seated at EOB   Standing balance support: During functional activity;Bilateral upper extremity supported Standing balance-Leahy Scale: Fair Standing balance comment: using RW                            Cognition Arousal/Alertness: Awake/alert Behavior During Therapy: WFL for tasks assessed/performed Overall Cognitive Status: Within Functional Limits for tasks assessed  Exercises General Exercises - Lower Extremity Long Arc Quad: Seated;AROM;Strengthening;Both;15 reps Hip Flexion/Marching: Seated;AROM;Strengthening;15 reps;Both Toe Raises: Seated;AROM;Strengthening;Both;15 reps Heel Raises: Seated;AROM;Strengthening;Both;15 reps    General Comments        Pertinent Vitals/Pain Pain Assessment: Faces Faces Pain Scale: Hurts little more Pain Location:  left knee Pain Descriptors / Indicators: Sore;Aching Pain Intervention(s): Limited activity within patient's tolerance;Monitored during session;Repositioned    Home Living                      Prior Function            PT Goals (current goals can now be found in the care plan section) Acute Rehab PT Goals Patient Stated Goal: Return home PT Goal Formulation: With patient Time For Goal Achievement: 08/27/21 Potential to Achieve Goals: Good Progress towards PT goals: Progressing toward goals    Frequency    Min 3X/week      PT Plan Current plan remains appropriate    Co-evaluation              AM-PAC PT "6 Clicks" Mobility   Outcome Measure  Help needed turning from your back to your side while in a flat bed without using bedrails?: A Little Help needed moving from lying on your back to sitting on the side of a flat bed without using bedrails?: A Little Help needed moving to and from a bed to a chair (including a wheelchair)?: A Little Help needed standing up from a chair using your arms (e.g., wheelchair or bedside chair)?: A Lot Help needed to walk in hospital room?: A Lot Help needed climbing 3-5 steps with a railing? : A Lot 6 Click Score: 15    End of Session   Activity Tolerance: Patient tolerated treatment well;Patient limited by fatigue Patient left: in bed;with call bell/phone within reach Nurse Communication: Mobility status PT Visit Diagnosis: Unsteadiness on feet (R26.81);Other abnormalities of gait and mobility (R26.89);Muscle weakness (generalized) (M62.81)     Time: 1455-1520 PT Time Calculation (min) (ACUTE ONLY): 25 min  Charges:  $Therapeutic Exercise: 8-22 mins $Therapeutic Activity: 8-22 mins                     3:33 PM, 08/20/21 Lonell Grandchild, MPT Physical Therapist with [facility] [phone] office [phone] mobile phone

## 2021-08-20 NOTE — Progress Notes (Addendum)
PROGRESS NOTE    [name]  QZE:[id number] DOB: 1961-07-11 DOA: 08/11/2021 PCP: [name], MD   Brief Narrative:   60 y.o. WM PMHx CAD with prior CABG, cirrhosis, hypertension, dyslipidemia, OSA on CPAP, thrombocytopenia, and type 2 diabetes comes to the hospital with complains with fall.  Found to have kalemia, elevated ammonia and thrombocytopenia.  His symptoms started improving with IV fluids, electrolyte supplementation.  PT/OT recommended SNF therefore arrangements been made.  Assessment & Plan:   Active Problems:   Essential hypertension   OSA on CPAP   Esophageal varices (HCC)   Liver failure (HCC)   Unspecified cirrhosis of liver (HCC)   Type 2 diabetes mellitus with hyperglycemia (HCC)   Hypotension   Chronic diastolic heart failure (HCC)   Hyperlipidemia associated with type 2 diabetes mellitus (HCC)   Back pain   Anemia   Obstructive sleep apnea (adult) (pediatric)   Hypokalemia   Muscle spasm   CAD (coronary artery disease)   HLD (hyperlipidemia)   Chronic idiopathic thrombocytopenia (HCC)   Increased ammonia level   Obese   Fall at home, initial encounter   Hypotension due to hypovolemia   Generalized weakness and falls Hypotension, improved - Secondary to electrolyte imbalance and dehydration.  This is improved with IV fluids.  Currently on midodrine.  Also required albumin.  Electrolytes have improved.  PT/OT - SNF  Chronic thrombocytopenia - Secondary to underlying liver disease.  Platelets - 119K  CAD status post CABG - Resume home meds.  Currently chest pain-free  Cirrhosis of the liver; NASH Hx of EV s/p EVL 07/2021 - Mild off-and-on hepatic encephalopathy.  On daily lactulose, rifaximin  Diabetes mellitus type 2 with hyperglycemia - Continue current regimen.  Sliding scale Accu-Cheks  OSA - Nightly CPAP    DVT prophylaxis: SCDs Code Status: Full code Family Communication:  Wife updated.   Status is: Inpatient  Remains  inpatient appropriate because: Pending placement, safe dc planning.   Dispo: The patient is from: Home              Anticipated d/c is to: SNF              Patient currently is medically stable to d/c. TOC working on placement   Difficult to place patient No     Subjective: No acute events overnight. No new complaints.   Review of Systems Otherwise negative except as per HPI, including: General = no fevers, chills, dizziness,  fatigue HEENT/EYES = negative for loss of vision, double vision, blurred vision,  sore throa Cardiovascular= negative for chest pain, palpitation Respiratory/lungs= negative for shortness of breath, cough, wheezing; hemoptysis,  Gastrointestinal= negative for nausea, vomiting, abdominal pain Genitourinary= negative for Dysuria MSK = Negative for arthralgia, myalgias Neurology= Negative for headache, numbness, tingling  Psychiatry= Negative for suicidal and homocidal ideation Skin= Negative for Rash   Examination:  Constitutional: Not in acute distress Respiratory: Clear to auscultation bilaterally Cardiovascular: Normal sinus rhythm, no rubs Abdomen: Nontender nondistended good bowel sounds Musculoskeletal: No edema noted Skin: No rashes seen Neurologic: CN 2-12 grossly intact.  And nonfocal Psychiatric: Normal judgment and insight. Alert and oriented x 3. Normal mood.     Objective: Vitals:   08/19/21 1429 08/19/21 2100 08/20/21 0500 08/20/21 0536  BP: 109/72 117/68  113/68  Pulse: 72 75  72  Resp: 20 18  16   Temp: 98.6 F (37 C) 98.1 F (36.7 C)  98.4 F (36.9 C)  TempSrc: Oral Oral  SpO2: 98% 100%  100%  Weight:   124.7 kg 124.7 kg  Height:        Intake/Output Summary (Last 24 hours) at 08/20/2021 1137 Last data filed at 08/20/2021 1610 Gross per 24 hour  Intake --  Output 600 ml  Net -600 ml   Filed Weights   08/19/21 0500 08/20/21 0500 08/20/21 0536  Weight: 122.7 kg 124.7 kg 124.7 kg     Data Reviewed:   CBC: Recent  Labs  Lab 08/16/21 0605 08/17/21 0443 08/18/21 0532 08/19/21 0521 08/20/21 0543  WBC 3.6* 3.3* 3.3* 3.6* 3.4*  NEUTROABS 2.1 2.0 2.0 2.0 2.1  HGB 8.6* 9.0* 8.5* 8.4* 8.8*  HCT 29.3* 30.3* 27.6* 28.4* 29.3*  MCV 99.0 97.1 96.2 96.9 97.7  PLT 92* 109* 105* 112* 960*   Basic Metabolic Panel: Recent Labs  Lab 08/16/21 0605 08/17/21 0443 08/18/21 0532 08/19/21 0521 08/20/21 0543  NA 134* 136 134* 136 135  K 4.0 4.1 4.3 4.1 4.1  CL 104 104 106 106 104  CO2 26 26 23 25 25   GLUCOSE 104* 114* 145* 100* 105*  BUN 14 13 13 12 12   CREATININE 1.09 1.07 1.09 1.14 1.13  CALCIUM 8.0* 8.6* 8.4* 8.4* 8.5*  MG 2.0 2.1 2.0 2.0 1.9  PHOS 3.0 2.9 3.0 2.8 2.9   GFR: Estimated Creatinine Clearance: 91 mL/min (by C-G formula based on SCr of 1.13 mg/dL). Liver Function Tests: Recent Labs  Lab 08/16/21 0605 08/17/21 0443 08/18/21 0532 08/19/21 0521 08/20/21 0543  AST 29 31 27 24 24   ALT 11 12 11 10 9   ALKPHOS 60 72 68 67 72  BILITOT 1.5* 1.4* 1.2 1.1 1.1  PROT 6.4* 6.5 6.2* 5.9* 6.2*  ALBUMIN 2.6* 2.6* 2.5* 2.5* 2.5*   No results for input(s): LIPASE, AMYLASE in the last 168 hours. Recent Labs  Lab 08/16/21 0605 08/17/21 0443 08/18/21 0532 08/19/21 0521 08/20/21 0543  AMMONIA 43* 49* 72* 43* 42*   Coagulation Profile: Recent Labs  Lab 08/16/21 0605 08/17/21 0443 08/18/21 0532 08/19/21 0521 08/20/21 0543  INR 1.3* 1.2 1.3* 1.3* 1.2   Cardiac Enzymes: No results for input(s): CKTOTAL, CKMB, CKMBINDEX, TROPONINI in the last 168 hours. BNP (last 3 results) No results for input(s): PROBNP in the last 8760 hours. HbA1C: No results for input(s): HGBA1C in the last 72 hours. CBG: Recent Labs  Lab 08/19/21 1139 08/19/21 1622 08/19/21 2057 08/20/21 0708 08/20/21 1116  GLUCAP 172* 131* 165* 103* 215*   Lipid Profile: No results for input(s): CHOL, HDL, LDLCALC, TRIG, CHOLHDL, LDLDIRECT in the last 72 hours. Thyroid Function Tests: No results for input(s): TSH,  T4TOTAL, FREET4, T3FREE, THYROIDAB in the last 72 hours. Anemia Panel: No results for input(s): VITAMINB12, FOLATE, FERRITIN, TIBC, IRON, RETICCTPCT in the last 72 hours. Sepsis Labs: No results for input(s): PROCALCITON, LATICACIDVEN in the last 168 hours.  Recent Results (from the past 240 hour(s))  Resp Panel by RT-PCR (Flu A&B, Covid) Nasopharyngeal Swab     Status: None   Collection Time: 08/11/21  7:09 AM   Specimen: Nasopharyngeal Swab; Nasopharyngeal(NP) swabs in vial transport medium  Result Value Ref Range Status   SARS Coronavirus 2 by RT PCR NEGATIVE NEGATIVE Final    Comment: (NOTE) SARS-CoV-2 target nucleic acids are NOT DETECTED.  The SARS-CoV-2 RNA is generally detectable in upper respiratory specimens during the acute phase of infection. The lowest concentration of SARS-CoV-2 viral copies this assay can detect is 138 copies/mL. A negative result does not  preclude SARS-Cov-2 infection and should not be used as the sole basis for treatment or other patient management decisions. A negative result may occur with  improper specimen collection/handling, submission of specimen other than nasopharyngeal swab, presence of viral mutation(s) within the areas targeted by this assay, and inadequate number of viral copies(<138 copies/mL). A negative result must be combined with clinical observations, patient history, and epidemiological information. The expected result is Negative.  Fact Sheet for Patients:  EntrepreneurPulse.com.au  Fact Sheet for Healthcare Providers:  IncredibleEmployment.be  This test is no t yet approved or cleared by the [place] FDA and  has been authorized for detection and/or diagnosis of SARS-CoV-2 by FDA under an Emergency Use Authorization (EUA). This EUA will remain  in effect (meaning this test can be used) for the duration of the COVID-19 declaration under Section 564(b)(1) of the Act, 21 U.S.C.section  360bbb-3(b)(1), unless the authorization is terminated  or revoked sooner.       Influenza A by PCR NEGATIVE NEGATIVE Final   Influenza B by PCR NEGATIVE NEGATIVE Final    Comment: (NOTE) The Xpert Xpress SARS-CoV-2/FLU/RSV plus assay is intended as an aid in the diagnosis of influenza from Nasopharyngeal swab specimens and should not be used as a sole basis for treatment. Nasal washings and aspirates are unacceptable for Xpert Xpress SARS-CoV-2/FLU/RSV testing.  Fact Sheet for Patients: EntrepreneurPulse.com.au  Fact Sheet for Healthcare Providers: IncredibleEmployment.be  This test is not yet approved or cleared by the [place] FDA and has been authorized for detection and/or diagnosis of SARS-CoV-2 by FDA under an Emergency Use Authorization (EUA). This EUA will remain in effect (meaning this test can be used) for the duration of the COVID-19 declaration under Section 564(b)(1) of the Act, 21 U.S.C. section 360bbb-3(b)(1), unless the authorization is terminated or revoked.  Performed at [facility], [address], [place], [place] [address]          Radiology Studies: No results found.      Scheduled Meds:  aspirin EC  81 mg Oral QHS   ferrous sulfate  325 mg Oral q AM   insulin aspart  0-15 Units Subcutaneous TID WC   insulin aspart  0-5 Units Subcutaneous QHS   insulin aspart  15 Units Subcutaneous TID AC   insulin glargine-yfgn  30 Units Subcutaneous QHS   insulin glargine-yfgn  40 Units Subcutaneous Daily   lactulose  20 g Oral Daily   lidocaine  4 patch Transdermal Q24H   magnesium oxide  400 mg Oral Daily   midodrine  10 mg Oral TID WC   nadolol  20 mg Oral Daily   pantoprazole  40 mg Oral BID AC   potassium chloride SA  20 mEq Oral BID   pravastatin  20 mg Oral q1800   rifaximin  550 mg Oral BID   venlafaxine XR  37.5 mg Oral Daily   Continuous Infusions:   LOS: 5 days   Time spent= 35  mins    Shriley Joffe Arsenio Loader, MD [facility]  If 7PM-7AM, please contact night-coverage  08/20/2021, 11:37 AM

## 2021-08-20 NOTE — Progress Notes (Signed)
Patient placed on nasal mask CPAP of 5 at 21%. Patient tolerating well at this time.  RT will continue to monitor.

## 2021-08-20 NOTE — Telephone Encounter (Signed)
Call received from patient, cell ph#[phone], relaying that he is in [facility], Room 310, following a fall at home onto his left side. Relayed he is also awaiting a liver transplant. States his question is: would Dr Aline Brochure possibly Xray his left knee, same knee which Dr Aline Brochure had done total knee replacement, on 08/09/17, due to left knee pain increasing. Please advise.

## 2021-08-20 NOTE — TOC Progression Note (Signed)
Transition of Care [facility]) - Progression Note    Patient Details  Name: [name] MRN: [id number] Date of Birth: 1961-05-16  Transition of Care [facility]) CM/SW Contact  Salome Arnt, [place] Phone Number: 08/20/2021, 2:23 PM  Clinical Narrative:  Per [name] at [facility], insurance authorization is still pending. TOC will continue to follow.      Expected Discharge Plan: Skilled Nursing Facility Barriers to Discharge: Insurance Authorization  Expected Discharge Plan and Services Expected Discharge Plan: [place]                                               Social Determinants of Health (SDOH) Interventions    Readmission Risk Interventions Readmission Risk Prevention Plan 07/22/2021 03/24/2021 12/10/2020  Transportation Screening Complete Complete Complete  HRI or Home Care Consult Complete - Complete  Social Work Consult for [place] Planning/Counseling Complete - Complete  Palliative Care Screening Not Applicable - Not Applicable  Medication Review Press photographer) Complete Complete Complete  PCP or Specialist appointment within 3-5 days of discharge - Complete -  [place] or [place] - Complete -  SW Recovery Care/Counseling Consult - Complete -  Palliative Care Screening - Not Applicable -  [place] - Not Applicable -  Some recent data might be hidden

## 2021-08-21 LAB — COMPREHENSIVE METABOLIC PANEL
ALT: 8 U/L (ref 0–44)
AST: 21 U/L (ref 15–41)
Albumin: 2.5 g/dL — ABNORMAL LOW (ref 3.5–5.0)
Alkaline Phosphatase: 70 U/L (ref 38–126)
Anion gap: 6 (ref 5–15)
BUN: 14 mg/dL (ref 8–23)
CO2: 24 mmol/L (ref 22–32)
Calcium: 8.4 mg/dL — ABNORMAL LOW (ref 8.9–10.3)
Chloride: 106 mmol/L (ref 98–111)
Creatinine, Ser: 1.09 mg/dL (ref 0.61–1.24)
GFR, Estimated: >60 mL/min (ref >60)
Glucose, Bld: 149 mg/dL — ABNORMAL HIGH (ref 70–99)
Potassium: 4 mmol/L (ref 3.5–5.1)
Sodium: 136 mmol/L (ref 135–145)
Total Bilirubin: 1 mg/dL (ref 0.3–1.2)
Total Protein: 6.3 g/dL — ABNORMAL LOW (ref 6.5–8.1)

## 2021-08-21 LAB — CBC WITH DIFFERENTIAL/PLATELET
Abs Immature Granulocytes: 0.02 10*3/uL (ref 0.00–0.07)
Basophils Absolute: 0 10*3/uL (ref 0.0–0.1)
Basophils Relative: 1 %
Eosinophils Absolute: 0.1 10*3/uL (ref 0.0–0.5)
Eosinophils Relative: 2 %
HCT: 27.7 % — ABNORMAL LOW (ref 39.0–52.0)
Hemoglobin: 8.5 g/dL — ABNORMAL LOW (ref 13.0–17.0)
Immature Granulocytes: 1 %
Lymphocytes Relative: 24 %
Lymphs Abs: 0.9 10*3/uL (ref 0.7–4.0)
MCH: 29.5 pg (ref 26.0–34.0)
MCHC: 30.7 g/dL (ref 30.0–36.0)
MCV: 96.2 fL (ref 80.0–100.0)
Monocytes Absolute: 0.5 10*3/uL (ref 0.1–1.0)
Monocytes Relative: 14 %
Neutro Abs: 2.1 10*3/uL (ref 1.7–7.7)
Neutrophils Relative %: 58 %
Platelets: 122 10*3/uL — ABNORMAL LOW (ref 150–400)
RBC: 2.88 MIL/uL — ABNORMAL LOW (ref 4.22–5.81)
RDW: 18.2 % — ABNORMAL HIGH (ref 11.5–15.5)
WBC: 3.6 10*3/uL — ABNORMAL LOW (ref 4.0–10.5)
nRBC: 0 % (ref 0.0–0.2)

## 2021-08-21 LAB — MAGNESIUM: Magnesium: 1.9 mg/dL (ref 1.7–2.4)

## 2021-08-21 LAB — PHOSPHORUS: Phosphorus: 2.7 mg/dL (ref 2.5–4.6)

## 2021-08-21 LAB — GLUCOSE, CAPILLARY
Glucose-Capillary: 130 mg/dL — ABNORMAL HIGH (ref 70–99)
Glucose-Capillary: 111 mg/dL — ABNORMAL HIGH (ref 70–99)
Glucose-Capillary: 114 mg/dL — ABNORMAL HIGH (ref 70–99)
Glucose-Capillary: 116 mg/dL — ABNORMAL HIGH (ref 70–99)

## 2021-08-21 LAB — PROTIME-INR
INR: 1.3 — ABNORMAL HIGH (ref 0.8–1.2)
Prothrombin Time: 16.3 seconds — ABNORMAL HIGH (ref 11.4–15.2)

## 2021-08-21 LAB — AMMONIA: Ammonia: 53 umol/L — ABNORMAL HIGH (ref 9–35)

## 2021-08-21 NOTE — Plan of Care (Signed)
  Problem: Acute Rehab OT Goals (only OT should resolve) Goal: Pt. Will Perform Grooming Flowsheets (Taken 08/21/2021 1224) Pt Will Perform Grooming:  with modified independence  standing  with adaptive equipment Goal: Pt. Will Perform Lower Body Dressing Flowsheets (Taken 08/21/2021 1224) Pt Will Perform Lower Body Dressing:  with modified independence  sitting/lateral leans  with adaptive equipment Goal: Pt. Will Transfer To Toilet Flowsheets (Taken 08/21/2021 1224) Pt Will Transfer to Toilet:  with modified independence  stand pivot transfer Goal: Pt/Caregiver Will Perform Home Exercise Program Flowsheets (Taken 08/21/2021 1224) Pt/caregiver will Perform Home Exercise Program:  Increased strength  Both right and left upper extremity  Independently  Aiyannah Fayad OT, MOT

## 2021-08-21 NOTE — Progress Notes (Signed)
PROGRESS NOTE    [name]  UDJ:[id number] DOB: 1961-06-24 DOA: 08/11/2021 PCP: [name], MD   Brief Narrative:   60 y.o. WM PMHx CAD with prior CABG, cirrhosis, hypertension, dyslipidemia, OSA on CPAP, thrombocytopenia, and type 2 diabetes comes to the hospital with complains with fall.  Found to have kalemia, elevated ammonia and thrombocytopenia.  His symptoms started improving with IV fluids, electrolyte supplementation.  PT/OT recommended SNF therefore arrangements been made.  Assessment & Plan:   Active Problems:   Essential hypertension   OSA on CPAP   Esophageal varices (HCC)   Liver failure (HCC)   Unspecified cirrhosis of liver (HCC)   Type 2 diabetes mellitus with hyperglycemia (HCC)   Hypotension   Chronic diastolic heart failure (HCC)   Hyperlipidemia associated with type 2 diabetes mellitus (HCC)   Back pain   Anemia   Obstructive sleep apnea (adult) (pediatric)   Hypokalemia   Muscle spasm   CAD (coronary artery disease)   HLD (hyperlipidemia)   Chronic idiopathic thrombocytopenia (HCC)   Increased ammonia level   Obese   Fall at home, initial encounter   Hypotension due to hypovolemia   Generalized weakness and falls Hypotension, improved - Secondary to electrolyte imbalance and dehydration.  This is improved with IV fluids.  Currently on midodrine.  Also required albumin.  Electrolytes have improved.  PT/OT - SNF  Chronic thrombocytopenia - Secondary to underlying liver disease.  Platelets - 119K  CAD status post CABG - Resume home meds.  Currently chest pain-free  Cirrhosis of the liver; NASH Hx of EV s/p EVL 07/2021 - Mild off-and-on hepatic encephalopathy.  On daily lactulose, rifaximin  Diabetes mellitus type 2 with hyperglycemia - Continue current regimen.  Sliding scale Accu-Cheks  OSA - Nightly CPAP  Generalized weakness,  and ambulatory dysfunction - Recurrent falls---PTA pt lived alone and did very poorly, patient has  significant limitations with mobility related ADLs- this patient needs to continue to be monitored in the hospital until a SNF bed is obtained as she is not safe to go home with her current physcical limitations  DVT prophylaxis: SCDs Code Status: Full code Family Communication:  Wife updated.   Status is: Inpatient  Remains inpatient appropriate because: Pending placement, safe dc planning.   Dispo: The patient is from: Home              Anticipated d/c is to: SNF              Patient currently is medically stable to d/c. TOC working on placement   Difficult to place patient No     Subjective: No acute events overnight. No new complaints.  -Resting comfortably complains of fatigue and weakness   Examination:  Constitutional: Not in acute distress Respiratory: Clear to auscultation bilaterally Cardiovascular: Normal sinus rhythm, no rubs Abdomen: Nontender nondistended good bowel sounds Musculoskeletal: No edema noted Skin: No rashes seen Neurologic: Generalized weakness and deconditioning without new focal deficits  Psychiatric: Normal judgment and insight. Alert and oriented x 3. Normal mood.     Objective: Vitals:   08/20/21 2232 08/21/21 0315 08/21/21 0500 08/21/21 1235  BP:  (!) 100/59  128/70  Pulse: 75 69  74  Resp: 16 19  20   Temp:  98.2 F (36.8 C)  98.1 F (36.7 C)  TempSrc:    Oral  SpO2: 99% 99%  100%  Weight:   124.8 kg   Height:        Intake/Output Summary (Last  24 hours) at 08/21/2021 1927 Last data filed at 08/21/2021 1300 Gross per 24 hour  Intake 480 ml  Output 900 ml  Net -420 ml   Filed Weights   08/20/21 0500 08/20/21 0536 08/21/21 0500  Weight: 124.7 kg 124.7 kg 124.8 kg     Data Reviewed:   CBC: Recent Labs  Lab 08/17/21 0443 08/18/21 0532 08/19/21 0521 08/20/21 0543 08/21/21 0521  WBC 3.3* 3.3* 3.6* 3.4* 3.6*  NEUTROABS 2.0 2.0 2.0 2.1 2.1  HGB 9.0* 8.5* 8.4* 8.8* 8.5*  HCT 30.3* 27.6* 28.4* 29.3* 27.7*  MCV 97.1 96.2  96.9 97.7 96.2  PLT 109* 105* 112* 119* 088*   Basic Metabolic Panel: Recent Labs  Lab 08/17/21 0443 08/18/21 0532 08/19/21 0521 08/20/21 0543 08/21/21 0521  NA 136 134* 136 135 136  K 4.1 4.3 4.1 4.1 4.0  CL 104 106 106 104 106  CO2 26 23 25 25 24   GLUCOSE 114* 145* 100* 105* 149*  BUN 13 13 12 12 14   CREATININE 1.07 1.09 1.14 1.13 1.09  CALCIUM 8.6* 8.4* 8.4* 8.5* 8.4*  MG 2.1 2.0 2.0 1.9 1.9  PHOS 2.9 3.0 2.8 2.9 2.7   GFR: Estimated Creatinine Clearance: 94.3 mL/min (by C-G formula based on SCr of 1.09 mg/dL). Liver Function Tests: Recent Labs  Lab 08/17/21 0443 08/18/21 0532 08/19/21 0521 08/20/21 0543 08/21/21 0521  AST 31 27 24 24 21   ALT 12 11 10 9 8   ALKPHOS 72 68 67 72 70  BILITOT 1.4* 1.2 1.1 1.1 1.0  PROT 6.5 6.2* 5.9* 6.2* 6.3*  ALBUMIN 2.6* 2.5* 2.5* 2.5* 2.5*   No results for input(s): LIPASE, AMYLASE in the last 168 hours. Recent Labs  Lab 08/17/21 0443 08/18/21 0532 08/19/21 0521 08/20/21 0543 08/21/21 0521  AMMONIA 49* 72* 43* 42* 53*   Coagulation Profile: Recent Labs  Lab 08/17/21 0443 08/18/21 0532 08/19/21 0521 08/20/21 0543 08/21/21 0521  INR 1.2 1.3* 1.3* 1.2 1.3*   Cardiac Enzymes: No results for input(s): CKTOTAL, CKMB, CKMBINDEX, TROPONINI in the last 168 hours. BNP (last 3 results) No results for input(s): PROBNP in the last 8760 hours. HbA1C: No results for input(s): HGBA1C in the last 72 hours. CBG: Recent Labs  Lab 08/20/21 1603 08/20/21 2215 08/21/21 0717 08/21/21 1056 08/21/21 1555  GLUCAP 220* 262* 130* 111* 114*   Lipid Profile: No results for input(s): CHOL, HDL, LDLCALC, TRIG, CHOLHDL, LDLDIRECT in the last 72 hours. Thyroid Function Tests: No results for input(s): TSH, T4TOTAL, FREET4, T3FREE, THYROIDAB in the last 72 hours. Anemia Panel: No results for input(s): VITAMINB12, FOLATE, FERRITIN, TIBC, IRON, RETICCTPCT in the last 72 hours. Sepsis Labs: No results for input(s): PROCALCITON,  LATICACIDVEN in the last 168 hours.  No results found for this or any previous visit (from the past 240 hour(s)).    Radiology Studies: DG Knee Complete 4 Views Left  Result Date: 08/20/2021 CLINICAL DATA:  Fall on left side 2 weeks ago.  Left knee pain. EXAM: LEFT KNEE - COMPLETE 4+ VIEW COMPARISON:  Radiograph 11/22/2017 FINDINGS: Left knee arthroplasty in expected alignment. There is no periprosthetic lucency or fracture. There has been patellar resurfacing. Minimal joint effusion. Small quadriceps and patellar tendon enthesophytes again seen. Mild soft tissue edema. IMPRESSION: 1. Intact left knee arthroplasty without complication or acute osseous abnormality. 2. Minimal joint effusion. Mild soft tissue edema. Electronically Signed   By: Keith Rake M.D.   On: 08/20/2021 16:49     Scheduled Meds:  aspirin EC  81 mg Oral QHS   ferrous sulfate  325 mg Oral q AM   insulin aspart  0-15 Units Subcutaneous TID WC   insulin aspart  0-5 Units Subcutaneous QHS   insulin aspart  15 Units Subcutaneous TID AC   insulin glargine-yfgn  30 Units Subcutaneous QHS   insulin glargine-yfgn  40 Units Subcutaneous Daily   lactulose  20 g Oral Daily   lidocaine  4 patch Transdermal Q24H   magnesium oxide  400 mg Oral Daily   midodrine  10 mg Oral TID WC   nadolol  20 mg Oral Daily   pantoprazole  40 mg Oral BID AC   potassium chloride SA  20 mEq Oral BID   pravastatin  20 mg Oral q1800   rifaximin  550 mg Oral BID   venlafaxine XR  37.5 mg Oral Daily   Continuous Infusions:   LOS: 6 days   Roxan Hockey, MD [facility]  If 7PM-7AM, please contact night-coverage  08/21/2021, 7:27 PM

## 2021-08-21 NOTE — Evaluation (Signed)
Occupational Therapy Evaluation Patient Details Name: [name] MRN: [id number] DOB: 02-07-1961 Today's Date: 08/21/2021    History of Present Illness [name] is a 60 y.o. male with medical history significant for CAD with prior CABG, cirrhosis, hypertension, dyslipidemia, OSA on CPAP, thrombocytopenia, and type 2 diabetes who presented to the ED after falling at home.  He states that he was using his walker when he tripped over a rug.  He is unsure of losing consciousness after hitting his head, but he did have difficulty getting off the floor due to low back pain.  He denies any other recent events and states that he lives with his wife at home and takes his medications as prescribed.  Wife states that he has been getting more deconditioned lately and has been falling more frequently.   Clinical Impression   Pt agreeable to OT treatment. Pt required min A for supine to sit mobility via single hand held assist to pull to sit. Pt required   min to mod A for initial sit to stand from EOB and chair. Once standing pt requires more Min G to min A for pivot and sitting portions of transfer. Pt demonstrates mild B UE shoulder weakness but is able to use B UE functionally. Pt left seated at EOB waiting for nursing with call bell within reach. Pt will benefit from continued OT in the hospital and recommended venue below to increase strength, balance, and endurance for safe ADL's.       Follow Up Recommendations  SNF    Equipment Recommendations  None recommended by OT           Precautions / Restrictions Precautions Precautions: Fall Restrictions Weight Bearing Restrictions: No      Mobility Bed Mobility Overal bed mobility: Needs Assistance Bed Mobility: Supine to Sit     Supine to sit: Min assist     General bed mobility comments: Single hand held assist; slow labored movement.    Transfers Overall transfer level: Needs assistance Equipment used: Rolling  walker (2 wheeled) Transfers: Stand Pivot Transfers;Sit to/from Stand Sit to Stand: Mod assist;Min assist Stand pivot transfers: Min assist;Mod assist       General transfer comment: Min to mod A needed for initial sit to stand using RW. Slow labored movement for stand pivot but less assist needed.    Balance Overall balance assessment: Needs assistance Sitting-balance support: Feet supported;No upper extremity supported Sitting balance-Leahy Scale: Good Sitting balance - Comments: seated at EOB   Standing balance support: During functional activity;Bilateral upper extremity supported Standing balance-Leahy Scale: Fair Standing balance comment: using RW                           ADL either performed or assessed with clinical judgement   ADL Overall ADL's : Needs assistance/impaired                         Toilet Transfer: Moderate assistance;Stand-pivot;Minimal assistance;RW Armed forces technical officer Details (indicate cue type and reason): simulated via EOB to chair transfer with RW           General ADL Comments: Pt reported he was unable to remove and don a sock while at bed level.     Vision Baseline Vision/History: 1 Wears glasses Ability to See in Adequate Light: 0 Adequate Patient Visual Report: No change from baseline  Pertinent Vitals/Pain Pain Assessment: 0-10 Pain Score: 7  Faces Pain Scale: Hurts a little bit Pain Location: left knee Pain Descriptors / Indicators: Other (Comment) (swollen) Pain Intervention(s): Limited activity within patient's tolerance;Repositioned;Monitored during session     Hand Dominance Right   Extremity/Trunk Assessment Upper Extremity Assessment Upper Extremity Assessment: Generalized weakness (Generalized weakness at shoulder. 5/5 elbow and grip MMT.)   Lower Extremity Assessment Lower Extremity Assessment: Defer to PT evaluation   Cervical / Trunk Assessment Cervical / Trunk Assessment:  Normal   Communication Communication Communication: No difficulties   Cognition Arousal/Alertness: Awake/alert Behavior During Therapy: WFL for tasks assessed/performed Overall Cognitive Status: Within Functional Limits for tasks assessed                                           Exercises General Exercises - Lower Extremity Long Arc Quad: Seated;AROM;Strengthening;Both;15 reps Hip Flexion/Marching: Seated;AROM;Strengthening;15 reps;Both Toe Raises: Seated;AROM;Strengthening;Both;15 reps Heel Raises: Seated;AROM;Strengthening;Both;15 reps   Shoulder Instructions      Home Living Family/patient expects to be discharged to:: Private residence Living Arrangements: Spouse/significant other Available Help at Discharge: Family;Available 24 hours/day Type of Home: House Home Access: Stairs to enter [employer] of Steps: 3 Entrance Stairs-Rails: Left Home Layout: One level     Bathroom Shower/Tub: Occupational psychologist: Handicapped height Bathroom Accessibility: Yes   Home Equipment: Environmental consultant - 2 wheels;Cane - single point;Cane - quad   Additional Comments: taken via review of PT note      Prior Functioning/Environment Level of Independence: Independent with assistive device(s)        Comments: Community ambulator using RW PRN        OT Problem List: Decreased strength;Decreased activity tolerance;Impaired balance (sitting and/or standing)      OT Treatment/Interventions: Self-care/ADL training;Therapeutic exercise;Patient/family education;Balance training;Therapeutic activities    OT Goals(Current goals can be found in the care plan section) Acute Rehab OT Goals Patient Stated Goal: Return home OT Goal Formulation: With patient Time For Goal Achievement: 09/04/21 Potential to Achieve Goals: Good  OT Frequency: Min 2X/week    End of Session Equipment Utilized During Treatment: Surveyor, mining Communication: Other  (comment) (Notified pt was waiting for nurse in room for bed change.)  Activity Tolerance: Patient tolerated treatment well Patient left: in bed;with call bell/phone within reach (seated at EOB waiting for nursing)  OT Visit Diagnosis: Unsteadiness on feet (R26.81);Muscle weakness (generalized) (M62.81);History of falling (Z91.81)                Time: 9767-3419 OT Time Calculation (min): 13 min Charges:  OT General Charges $OT Visit: 1 Visit OT Evaluation $OT Eval Low Complexity: [place] OT, MOT  Larey Seat 08/21/2021, 12:22 PM

## 2021-08-21 NOTE — Progress Notes (Signed)
Physical Therapy Treatment Patient Details Name: [name] MRN: [id number] DOB: 1961-07-16 Today's Date: 08/21/2021    History of Present Illness [name] is a 60 y.o. male with medical history significant for CAD with prior CABG, cirrhosis, hypertension, dyslipidemia, OSA on CPAP, thrombocytopenia, and type 2 diabetes who presented to the ED after falling at home.  He states that he was using his walker when he tripped over a rug.  He is unsure of losing consciousness after hitting his head, but he did have difficulty getting off the floor due to low back pain.  He denies any other recent events and states that he lives with his wife at home and takes his medications as prescribed.  Wife states that he has been getting more deconditioned lately and has been falling more frequently.    PT Comments    Patient presents seated at EOB (assisted by OT) and agreeable for therapy.  Patient demonstrates increased endurance/distance for ambulation demonstrating slow labored cadence with occasional standing rest breaks, no loss of balance and limited mostly due to fatigue.  Patient requested to continue sitting up at bedside after therapy - nursing staff notified.  Patient will benefit from continued physical therapy in hospital and recommended venue below to increase strength, balance, endurance for safe ADLs and gait.     Follow Up Recommendations  SNF     Equipment Recommendations  None recommended by PT    Recommendations for Other Services       Precautions / Restrictions Precautions Precautions: Fall Restrictions Weight Bearing Restrictions: No    Mobility  Bed Mobility               General bed mobility comments: Patient presents seated at EOB (assisted by OT)    Transfers Overall transfer level: Needs assistance Equipment used: Rolling walker (2 wheeled) Transfers: Stand Pivot Transfers;Sit to/from Stand Sit to Stand: Min assist Stand pivot transfers:  Min assist;Min guard       General transfer comment: slightly increased BLE strength for completing sit to stands  Ambulation/Gait Ambulation/Gait assistance: Min guard;Min assist Gait Distance (Feet): 40 Feet Assistive device: Rolling walker (2 wheeled) Gait Pattern/deviations: Decreased step length - right;Decreased step length - left;Decreased stride length Gait velocity: decreased   General Gait Details: increased endurance/distance for ambulation in room/hallway with slow labored cadence, occasional standing rest breaks and limited mostly due to c/o fatigue   Stairs             Wheelchair Mobility    Modified Rankin (Stroke Patients Only)       Balance Overall balance assessment: Needs assistance Sitting-balance support: Feet supported;No upper extremity supported Sitting balance-Leahy Scale: Good Sitting balance - Comments: seated at EOB   Standing balance support: During functional activity;Bilateral upper extremity supported Standing balance-Leahy Scale: Fair Standing balance comment: using RW                            Cognition Arousal/Alertness: Awake/alert Behavior During Therapy: WFL for tasks assessed/performed Overall Cognitive Status: Within Functional Limits for tasks assessed                                        Exercises General Exercises - Lower Extremity Long Arc Quad: Seated;AROM;Strengthening;Both;15 reps Hip Flexion/Marching: Seated;AROM;Strengthening;15 reps;Both Toe Raises: Seated;AROM;Strengthening;Both;15 reps Heel Raises: Seated;AROM;Strengthening;Both;15 reps    General  Comments        Pertinent Vitals/Pain Pain Assessment: Faces Faces Pain Scale: Hurts a little bit Pain Location: left knee Pain Descriptors / Indicators: Sore Pain Intervention(s): Limited activity within patient's tolerance;Monitored during session    Home Living                      Prior Function             PT Goals (current goals can now be found in the care plan section) Acute Rehab PT Goals Patient Stated Goal: Return home PT Goal Formulation: With patient Time For Goal Achievement: 08/27/21 Potential to Achieve Goals: Good Progress towards PT goals: Progressing toward goals    Frequency    Min 3X/week      PT Plan Current plan remains appropriate    Co-evaluation              AM-PAC PT "6 Clicks" Mobility   Outcome Measure  Help needed turning from your back to your side while in a flat bed without using bedrails?: A Little Help needed moving from lying on your back to sitting on the side of a flat bed without using bedrails?: A Little Help needed moving to and from a bed to a chair (including a wheelchair)?: A Little Help needed standing up from a chair using your arms (e.g., wheelchair or bedside chair)?: A Little Help needed to walk in hospital room?: A Lot Help needed climbing 3-5 steps with a railing? : A Lot 6 Click Score: 16    End of Session Equipment Utilized During Treatment: Gait belt Activity Tolerance: Patient tolerated treatment well;Patient limited by fatigue Patient left: in bed;with call bell/phone within reach (Patient left seated at bedside - nursing staff informed) Nurse Communication: Mobility status PT Visit Diagnosis: Unsteadiness on feet (R26.81);Other abnormalities of gait and mobility (R26.89);Muscle weakness (generalized) (M62.81)     Time: 2103-1281 PT Time Calculation (min) (ACUTE ONLY): 31 min  Charges:  $Gait Training: 8-22 mins $Therapeutic Exercise: 8-22 mins                     11:33 AM, 08/21/21 Lonell Grandchild, MPT Physical Therapist with [facility] [phone] office [phone] mobile phone

## 2021-08-22 LAB — PHOSPHORUS: Phosphorus: 3.1 mg/dL (ref 2.5–4.6)

## 2021-08-22 LAB — CBC WITH DIFFERENTIAL/PLATELET
Abs Immature Granulocytes: 0.02 10*3/uL (ref 0.00–0.07)
Basophils Absolute: 0 10*3/uL (ref 0.0–0.1)
Basophils Relative: 1 %
Eosinophils Absolute: 0.1 10*3/uL (ref 0.0–0.5)
Eosinophils Relative: 2 %
HCT: 27.8 % — ABNORMAL LOW (ref 39.0–52.0)
Hemoglobin: 8.4 g/dL — ABNORMAL LOW (ref 13.0–17.0)
Immature Granulocytes: 1 %
Lymphocytes Relative: 24 %
Lymphs Abs: 0.8 10*3/uL (ref 0.7–4.0)
MCH: 29.3 pg (ref 26.0–34.0)
MCHC: 30.2 g/dL (ref 30.0–36.0)
MCV: 96.9 fL (ref 80.0–100.0)
Monocytes Absolute: 0.4 10*3/uL (ref 0.1–1.0)
Monocytes Relative: 12 %
Neutro Abs: 2 10*3/uL (ref 1.7–7.7)
Neutrophils Relative %: 60 %
Platelets: 125 10*3/uL — ABNORMAL LOW (ref 150–400)
RBC: 2.87 MIL/uL — ABNORMAL LOW (ref 4.22–5.81)
RDW: 18.1 % — ABNORMAL HIGH (ref 11.5–15.5)
WBC: 3.3 10*3/uL — ABNORMAL LOW (ref 4.0–10.5)
nRBC: 0 % (ref 0.0–0.2)

## 2021-08-22 LAB — COMPREHENSIVE METABOLIC PANEL
ALT: 9 U/L (ref 0–44)
AST: 23 U/L (ref 15–41)
Albumin: 2.5 g/dL — ABNORMAL LOW (ref 3.5–5.0)
Alkaline Phosphatase: 73 U/L (ref 38–126)
Anion gap: 5 (ref 5–15)
BUN: 12 mg/dL (ref 8–23)
CO2: 25 mmol/L (ref 22–32)
Calcium: 8.5 mg/dL — ABNORMAL LOW (ref 8.9–10.3)
Chloride: 107 mmol/L (ref 98–111)
Creatinine, Ser: 1.09 mg/dL (ref 0.61–1.24)
GFR, Estimated: >60 mL/min (ref >60)
Glucose, Bld: 99 mg/dL (ref 70–99)
Potassium: 4 mmol/L (ref 3.5–5.1)
Sodium: 137 mmol/L (ref 135–145)
Total Bilirubin: 0.8 mg/dL (ref 0.3–1.2)
Total Protein: 6.2 g/dL — ABNORMAL LOW (ref 6.5–8.1)

## 2021-08-22 LAB — PROTIME-INR
INR: 1.3 — ABNORMAL HIGH (ref 0.8–1.2)
Prothrombin Time: 15.8 seconds — ABNORMAL HIGH (ref 11.4–15.2)

## 2021-08-22 LAB — GLUCOSE, CAPILLARY
Glucose-Capillary: 80 mg/dL (ref 70–99)
Glucose-Capillary: 124 mg/dL — ABNORMAL HIGH (ref 70–99)
Glucose-Capillary: 164 mg/dL — ABNORMAL HIGH (ref 70–99)
Glucose-Capillary: 219 mg/dL — ABNORMAL HIGH (ref 70–99)

## 2021-08-22 LAB — MAGNESIUM: Magnesium: 1.9 mg/dL (ref 1.7–2.4)

## 2021-08-22 LAB — AMMONIA: Ammonia: 42 umol/L — ABNORMAL HIGH (ref 9–35)

## 2021-08-22 MED ORDER — INSULIN ASPART 100 UNIT/ML IJ SOLN
0.0000 [IU] | Freq: Three times a day (TID) | INTRAMUSCULAR | Status: DC
  Administered 2021-08-22 – 2021-08-23 (×2): 1 [IU] via SUBCUTANEOUS
  Administered 2021-08-24: 2 [IU] via SUBCUTANEOUS
  Administered 2021-08-24: 1 [IU] via SUBCUTANEOUS

## 2021-08-22 MED ORDER — INSULIN ASPART 100 UNIT/ML IJ SOLN
4.0000 [IU] | Freq: Three times a day (TID) | INTRAMUSCULAR | Status: DC
  Administered 2021-08-22 – 2021-08-25 (×9): 4 [IU] via SUBCUTANEOUS

## 2021-08-22 MED ORDER — INSULIN ASPART 100 UNIT/ML IJ SOLN
0.0000 [IU] | Freq: Every day | INTRAMUSCULAR | Status: DC
Start: 2021-08-22 — End: 2021-08-25
  Administered 2021-08-22 – 2021-08-23 (×2): 2 [IU] via SUBCUTANEOUS

## 2021-08-22 NOTE — TOC Progression Note (Signed)
Transition of Care [facility]) - Progression Note    Patient Details  Name: [name] MRN: [id number] Date of Birth: 07-10-1961  Transition of Care [facility]) CM/SW Contact  Natasha Bence, LCSW Phone Number: 08/22/2021, 2:07 PM  Clinical Narrative:    CSW contacted Debbie with [facility] to inquire about auth. Debbie reported auth still pending TOC to follow.   Expected Discharge Plan: [place] Barriers to Discharge: Continued Medical Work up  Expected Discharge Plan and Services Expected Discharge Plan: [place]         Expected Discharge Date: 08/21/21                                     Social Determinants of Health (SDOH) Interventions    Readmission Risk Interventions Readmission Risk Prevention Plan 07/22/2021 03/24/2021 12/10/2020  Transportation Screening Complete Complete Complete  HRI or Home Care Consult Complete - Complete  Social Work Consult for [place] Planning/Counseling Complete - Complete  Palliative Care Screening Not Applicable - Not Applicable  Medication Review Press photographer) Complete Complete Complete  PCP or Specialist appointment within 3-5 days of discharge - Complete -  [place] or [place] - Complete -  SW Recovery Care/Counseling Consult - Complete -  Palliative Care Screening - Not Applicable -  [place] - Not Applicable -  Some recent data might be hidden

## 2021-08-22 NOTE — Progress Notes (Signed)
PROGRESS NOTE    SY SAINTJEAN  MWU:[id number] DOB: 1961/06/20 DOA: 08/11/2021 PCP: [name], MD   Brief Narrative:   60 y.o. WM PMHx CAD with prior CABG, cirrhosis, hypertension, dyslipidemia, OSA on CPAP, thrombocytopenia, and type 2 diabetes comes to the hospital with complains with fall.  Found to have kalemia, elevated ammonia and thrombocytopenia.  His symptoms started improving with IV fluids, electrolyte supplementation.  PT/OT recommended SNF therefore arrangements been made. --Remains medically stable for discharge to SNF when insurance approval for SNF placement is obtained  Assessment & Plan:   Active Problems:   Essential hypertension   OSA on CPAP   Esophageal varices (HCC)   Liver failure (HCC)   Unspecified cirrhosis of liver (HCC)   Type 2 diabetes mellitus with hyperglycemia (HCC)   Hypotension   Chronic diastolic heart failure (HCC)   Hyperlipidemia associated with type 2 diabetes mellitus (HCC)   Back pain   Anemia   Obstructive sleep apnea (adult) (pediatric)   Hypokalemia   Muscle spasm   CAD (coronary artery disease)   HLD (hyperlipidemia)   Chronic idiopathic thrombocytopenia (HCC)   Increased ammonia level   Obese   Fall at home, initial encounter   Hypotension due to hypovolemia   Generalized weakness and falls Hypotension, improved - Secondary to electrolyte imbalance and dehydration.  This is improved with IV fluids.  Currently on midodrine.  Also required albumin.  Electrolytes have improved.   PT/OT - SNF --Remains medically stable for discharge to SNF when insurance approval for SNF placement is obtained  Chronic thrombocytopenia/pancytopenia - Secondary to underlying liver disease.   Platelets -stable around 120k  -Hemoglobin stable above 8  CAD status post CABG  Currently chest pain-free -Continue pravastatin, aspirin and nadolol  Cirrhosis of the liver; NASH Hx of EV s/p EVL 07/2021 - Mild off-and-on hepatic  encephalopathy.  On daily lactulose, rifaximin -Continue nadolol -Ammonia has been stable around 40  Diabetes mellitus type 2 with hyperglycemia - Continue Lantus insulin as well as Use Novolog/Humalog Sliding scale insulin with Accu-Cheks/Fingersticks as ordered   OSA - Nightly CPAP  Generalized weakness,  and ambulatory dysfunction - Recurrent falls---PTA pt lived alone and did very poorly, patient has significant limitations with mobility related ADLs- this patient needs to continue to be monitored in the hospital until a SNF bed is obtained as she is not safe to go home with her current physcical limitations  DVT prophylaxis: SCDs Code Status: Full code Family Communication:  Wife updated.   Status is: Inpatient  Remains inpatient appropriate because: Pending placement, safe dc planning.  -Remains medically stable for discharge to SNF when insurance approval for SNF placement is obtained Dispo: The patient is from: Home              Anticipated d/c is to: SNF              Patient currently is medically stable to d/c. TOC working on placement   Difficult to place patient No     Subjective: No acute events overnight.  -Resting comfortably complains of fatigue and weakness Incontinent in bed  Examination:  Physical Exam  Gen:- Awake Alert,  in no apparent distress  HEENT:- [place].AT, No sclera icterus Neck-Supple Neck,No JVD,.  Lungs-  CTAB, no wheezing CV- S1, S2 normal, RRR Abd-  +ve B.Sounds, Abd Soft, No tenderness,    Extremity/Skin:- trace  edema, good pulses  Psych-affect is appropriate, oriented x3 Neuro-Generalized weakness, no new focal deficits, no  tremors  Objective: Vitals:   08/21/21 0500 08/21/21 1235 08/21/21 2003 08/22/21 0533  BP:  128/70 99/63 113/66  Pulse:  74 70 70  Resp:  20 15 20   Temp:  98.1 F (36.7 C) 98.3 F (36.8 C) 98.2 F (36.8 C)  TempSrc:  Oral Oral Oral  SpO2:  100% 100% 100%  Weight: 124.8 kg     Height:         Intake/Output Summary (Last 24 hours) at 08/22/2021 1228 Last data filed at 08/22/2021 0900 Gross per 24 hour  Intake 600 ml  Output 800 ml  Net -200 ml   Filed Weights   08/20/21 0500 08/20/21 0536 08/21/21 0500  Weight: 124.7 kg 124.7 kg 124.8 kg   Data Reviewed:   CBC: Recent Labs  Lab 08/18/21 0532 08/19/21 0521 08/20/21 0543 08/21/21 0521 08/22/21 0559  WBC 3.3* 3.6* 3.4* 3.6* 3.3*  NEUTROABS 2.0 2.0 2.1 2.1 2.0  HGB 8.5* 8.4* 8.8* 8.5* 8.4*  HCT 27.6* 28.4* 29.3* 27.7* 27.8*  MCV 96.2 96.9 97.7 96.2 96.9  PLT 105* 112* 119* 122* 941*   Basic Metabolic Panel: Recent Labs  Lab 08/18/21 0532 08/19/21 0521 08/20/21 0543 08/21/21 0521 08/22/21 0559  NA 134* 136 135 136 137  K 4.3 4.1 4.1 4.0 4.0  CL 106 106 104 106 107  CO2 23 25 25 24 25   GLUCOSE 145* 100* 105* 149* 99  BUN 13 12 12 14 12   CREATININE 1.09 1.14 1.13 1.09 1.09  CALCIUM 8.4* 8.4* 8.5* 8.4* 8.5*  MG 2.0 2.0 1.9 1.9 1.9  PHOS 3.0 2.8 2.9 2.7 3.1   GFR: Estimated Creatinine Clearance: 94.3 mL/min (by C-G formula based on SCr of 1.09 mg/dL). Liver Function Tests: Recent Labs  Lab 08/18/21 0532 08/19/21 0521 08/20/21 0543 08/21/21 0521 08/22/21 0559  AST 27 24 24 21 23   ALT 11 10 9 8 9   ALKPHOS 68 67 72 70 73  BILITOT 1.2 1.1 1.1 1.0 0.8  PROT 6.2* 5.9* 6.2* 6.3* 6.2*  ALBUMIN 2.5* 2.5* 2.5* 2.5* 2.5*   No results for input(s): LIPASE, AMYLASE in the last 168 hours. Recent Labs  Lab 08/18/21 0532 08/19/21 0521 08/20/21 0543 08/21/21 0521 08/22/21 0559  AMMONIA 72* 43* 42* 53* 42*   Coagulation Profile: Recent Labs  Lab 08/18/21 0532 08/19/21 0521 08/20/21 0543 08/21/21 0521 08/22/21 0559  INR 1.3* 1.3* 1.2 1.3* 1.3*   Cardiac Enzymes: No results for input(s): CKTOTAL, CKMB, CKMBINDEX, TROPONINI in the last 168 hours. BNP (last 3 results) No results for input(s): PROBNP in the last 8760 hours. HbA1C: No results for input(s): HGBA1C in the last 72  hours. CBG: Recent Labs  Lab 08/21/21 1056 08/21/21 1555 08/21/21 2107 08/22/21 0749 08/22/21 1123  GLUCAP 111* 114* 116* 80 124*   Lipid Profile: No results for input(s): CHOL, HDL, LDLCALC, TRIG, CHOLHDL, LDLDIRECT in the last 72 hours. Thyroid Function Tests: No results for input(s): TSH, T4TOTAL, FREET4, T3FREE, THYROIDAB in the last 72 hours. Anemia Panel: No results for input(s): VITAMINB12, FOLATE, FERRITIN, TIBC, IRON, RETICCTPCT in the last 72 hours. Sepsis Labs: No results for input(s): PROCALCITON, LATICACIDVEN in the last 168 hours.  No results found for this or any previous visit (from the past 240 hour(s)).    Radiology Studies: DG Knee Complete 4 Views Left  Result Date: 08/20/2021 CLINICAL DATA:  Fall on left side 2 weeks ago.  Left knee pain. EXAM: LEFT KNEE - COMPLETE 4+ VIEW COMPARISON:  Radiograph 11/22/2017 FINDINGS:  Left knee arthroplasty in expected alignment. There is no periprosthetic lucency or fracture. There has been patellar resurfacing. Minimal joint effusion. Small quadriceps and patellar tendon enthesophytes again seen. Mild soft tissue edema. IMPRESSION: 1. Intact left knee arthroplasty without complication or acute osseous abnormality. 2. Minimal joint effusion. Mild soft tissue edema. Electronically Signed   By: Keith Rake M.D.   On: 08/20/2021 16:49     Scheduled Meds:  aspirin EC  81 mg Oral QHS   ferrous sulfate  325 mg Oral q AM   insulin aspart  0-5 Units Subcutaneous QHS   insulin aspart  0-6 Units Subcutaneous TID WC   insulin aspart  4 Units Subcutaneous TID WC   insulin glargine-yfgn  30 Units Subcutaneous QHS   insulin glargine-yfgn  40 Units Subcutaneous Daily   lactulose  20 g Oral Daily   lidocaine  4 patch Transdermal Q24H   magnesium oxide  400 mg Oral Daily   midodrine  10 mg Oral TID WC   nadolol  20 mg Oral Daily   pantoprazole  40 mg Oral BID AC   potassium chloride SA  20 mEq Oral BID   pravastatin  20 mg Oral  q1800   rifaximin  550 mg Oral BID   venlafaxine XR  37.5 mg Oral Daily   Continuous Infusions:   LOS: 7 days   Roxan Hockey, MD [facility]  If 7PM-7AM, please contact night-coverage  08/22/2021, 12:28 PM

## 2021-08-23 LAB — GLUCOSE, CAPILLARY
Glucose-Capillary: 112 mg/dL — ABNORMAL HIGH (ref 70–99)
Glucose-Capillary: 153 mg/dL — ABNORMAL HIGH (ref 70–99)
Glucose-Capillary: 160 mg/dL — ABNORMAL HIGH (ref 70–99)
Glucose-Capillary: 215 mg/dL — ABNORMAL HIGH (ref 70–99)

## 2021-08-23 LAB — PROTIME-INR
INR: 1.3 — ABNORMAL HIGH (ref 0.8–1.2)
Prothrombin Time: 16.3 seconds — ABNORMAL HIGH (ref 11.4–15.2)

## 2021-08-23 MED ORDER — TORSEMIDE 20 MG PO TABS
20.0000 mg | ORAL_TABLET | Freq: Every day | ORAL | Status: DC
  Administered 2021-08-23 – 2021-08-25 (×3): 20 mg via ORAL
  Filled 2021-08-23 (×3): qty 1

## 2021-08-23 MED ORDER — SPIRONOLACTONE 25 MG PO TABS
12.5000 mg | ORAL_TABLET | Freq: Every day | ORAL | Status: DC
  Administered 2021-08-23 – 2021-08-25 (×3): 12.5 mg via ORAL
  Filled 2021-08-23 (×6): qty 1

## 2021-08-23 MED ORDER — SPIRONOLACTONE 25 MG PO TABS
ORAL_TABLET | ORAL | Status: AC
  Filled 2021-08-23: qty 1

## 2021-08-23 NOTE — Progress Notes (Signed)
PROGRESS NOTE    [name]  GYK:[id number] DOB: Feb 13, 1961 DOA: 08/11/2021 PCP: [name], MD   Brief Narrative:   60 y.o. WM PMHx CAD with prior CABG, cirrhosis, hypertension, dyslipidemia, OSA on CPAP, thrombocytopenia, and type 2 diabetes comes to the hospital with complains with fall.  Found to have kalemia, elevated ammonia and thrombocytopenia.  His symptoms started improving with IV fluids, electrolyte supplementation.  PT/OT recommended SNF therefore arrangements been made. --Remains medically stable for discharge to SNF when insurance approval for SNF placement is obtained  Assessment & Plan:   Active Problems:   Essential hypertension   OSA on CPAP   Esophageal varices (HCC)   Liver failure (HCC)   Unspecified cirrhosis of liver (HCC)   Type 2 diabetes mellitus with hyperglycemia (HCC)   Hypotension   Chronic diastolic heart failure (HCC)   Hyperlipidemia associated with type 2 diabetes mellitus (HCC)   Back pain   Anemia   Obstructive sleep apnea (adult) (pediatric)   Hypokalemia   Muscle spasm   CAD (coronary artery disease)   HLD (hyperlipidemia)   Chronic idiopathic thrombocytopenia (HCC)   Increased ammonia level   Obese   Fall at home, initial encounter   Hypotension due to hypovolemia   Generalized weakness and falls Hypotension, improved - Secondary to electrolyte imbalance and dehydration.  This is improved with IV fluids.  Currently on midodrine.  Also required albumin.  Electrolytes have improved.   PT/OT - SNF --Remains medically stable for discharge to SNF when insurance approval for SNF placement is obtained  Chronic thrombocytopenia/pancytopenia - Secondary to underlying liver disease.   Platelets -stable around 120k  -Hemoglobin stable above 8  CAD status post CABG  Currently chest pain-free -Continue pravastatin, aspirin and nadolol  Cirrhosis of the liver; NASH Hx of EV s/p EVL 07/2021 - Mild off-and-on hepatic  encephalopathy.  On daily lactulose, rifaximin -Continue nadolol -Ammonia has been stable around 40 -Torsemide and Aldactone as ordered  Diabetes mellitus type 2 with hyperglycemia - Continue Lantus insulin as well as Use Novolog/Humalog Sliding scale insulin with Accu-Cheks/Fingersticks as ordered   OSA - Nightly CPAP  Generalized weakness,  and ambulatory dysfunction - Recurrent falls---PTA pt lived alone and did very poorly, patient has significant limitations with mobility related ADLs- this patient needs to continue to be monitored in the hospital until a SNF bed is obtained as she is not safe to go home with her current physcical limitations  DVT prophylaxis: SCDs Code Status: Full code Family Communication:  Wife updated   Status is: Inpatient  Remains inpatient appropriate because: Pending placement, safe dc planning.  -Remains medically stable for discharge to SNF when insurance approval for SNF placement is obtained Dispo: The patient is from: Home              Anticipated d/c is to: SNF              Patient currently is medically stable to d/c. TOC working on placement   Difficult to place patient No     Subjective:  -Resting comfortably, talking with wife on the phone Patient and wife asking if torsemide can be restarted  Examination:  Physical Exam  Gen:- Awake Alert,  in no apparent distress  HEENT:- [place].AT, No sclera icterus Neck-Supple Neck,No JVD,.  Lungs-  CTAB, no wheezing CV- S1, S2 normal, 3/6 SM, RRR Abd-  +ve B.Sounds, Abd Soft, No tenderness,    Extremity/Skin:- trace  edema, good pulses  Psych-affect is  appropriate, oriented x3 Neuro-Generalized weakness, no new focal deficits, no tremors  Objective: Vitals:   08/22/21 2312 08/23/21 0600 08/23/21 0706 08/23/21 1449  BP:   118/66 118/69  Pulse: 76  66 70  Resp: 16  16 20   Temp:   98.3 F (36.8 C) 98.2 F (36.8 C)  TempSrc:   Oral Oral  SpO2: 96%  100% 99%  Weight:  125.2 kg     Height:        Intake/Output Summary (Last 24 hours) at 08/23/2021 1759 Last data filed at 08/23/2021 0630 Gross per 24 hour  Intake 480 ml  Output 450 ml  Net 30 ml   Filed Weights   08/20/21 0536 08/21/21 0500 08/23/21 0600  Weight: 124.7 kg 124.8 kg 125.2 kg   Data Reviewed:   CBC: Recent Labs  Lab 08/18/21 0532 08/19/21 0521 08/20/21 0543 08/21/21 0521 08/22/21 0559  WBC 3.3* 3.6* 3.4* 3.6* 3.3*  NEUTROABS 2.0 2.0 2.1 2.1 2.0  HGB 8.5* 8.4* 8.8* 8.5* 8.4*  HCT 27.6* 28.4* 29.3* 27.7* 27.8*  MCV 96.2 96.9 97.7 96.2 96.9  PLT 105* 112* 119* 122* 628*   Basic Metabolic Panel: Recent Labs  Lab 08/18/21 0532 08/19/21 0521 08/20/21 0543 08/21/21 0521 08/22/21 0559  NA 134* 136 135 136 137  K 4.3 4.1 4.1 4.0 4.0  CL 106 106 104 106 107  CO2 23 25 25 24 25   GLUCOSE 145* 100* 105* 149* 99  BUN 13 12 12 14 12   CREATININE 1.09 1.14 1.13 1.09 1.09  CALCIUM 8.4* 8.4* 8.5* 8.4* 8.5*  MG 2.0 2.0 1.9 1.9 1.9  PHOS 3.0 2.8 2.9 2.7 3.1   GFR: Estimated Creatinine Clearance: 94.5 mL/min (by C-G formula based on SCr of 1.09 mg/dL). Liver Function Tests: Recent Labs  Lab 08/18/21 0532 08/19/21 0521 08/20/21 0543 08/21/21 0521 08/22/21 0559  AST 27 24 24 21 23   ALT 11 10 9 8 9   ALKPHOS 68 67 72 70 73  BILITOT 1.2 1.1 1.1 1.0 0.8  PROT 6.2* 5.9* 6.2* 6.3* 6.2*  ALBUMIN 2.5* 2.5* 2.5* 2.5* 2.5*   No results for input(s): LIPASE, AMYLASE in the last 168 hours. Recent Labs  Lab 08/18/21 0532 08/19/21 0521 08/20/21 0543 08/21/21 0521 08/22/21 0559  AMMONIA 72* 43* 42* 53* 42*   Coagulation Profile: Recent Labs  Lab 08/19/21 0521 08/20/21 0543 08/21/21 0521 08/22/21 0559 08/23/21 0459  INR 1.3* 1.2 1.3* 1.3* 1.3*   Cardiac Enzymes: No results for input(s): CKTOTAL, CKMB, CKMBINDEX, TROPONINI in the last 168 hours. BNP (last 3 results) No results for input(s): PROBNP in the last 8760 hours. HbA1C: No results for input(s): HGBA1C in the last 72  hours. CBG: Recent Labs  Lab 08/22/21 1619 08/22/21 2103 08/23/21 0754 08/23/21 1206 08/23/21 1637  GLUCAP 164* 219* 112* 153* 160*   Lipid Profile: No results for input(s): CHOL, HDL, LDLCALC, TRIG, CHOLHDL, LDLDIRECT in the last 72 hours. Thyroid Function Tests: No results for input(s): TSH, T4TOTAL, FREET4, T3FREE, THYROIDAB in the last 72 hours. Anemia Panel: No results for input(s): VITAMINB12, FOLATE, FERRITIN, TIBC, IRON, RETICCTPCT in the last 72 hours. Sepsis Labs: No results for input(s): PROCALCITON, LATICACIDVEN in the last 168 hours.  No results found for this or any previous visit (from the past 240 hour(s)).    Radiology Studies: No results found.   Scheduled Meds:  aspirin EC  81 mg Oral QHS   ferrous sulfate  325 mg Oral q AM   insulin aspart  0-5 Units Subcutaneous QHS   insulin aspart  0-6 Units Subcutaneous TID WC   insulin aspart  4 Units Subcutaneous TID WC   insulin glargine-yfgn  30 Units Subcutaneous QHS   insulin glargine-yfgn  40 Units Subcutaneous Daily   lactulose  20 g Oral Daily   lidocaine  4 patch Transdermal Q24H   magnesium oxide  400 mg Oral Daily   midodrine  10 mg Oral TID WC   nadolol  20 mg Oral Daily   pantoprazole  40 mg Oral BID AC   potassium chloride SA  20 mEq Oral BID   pravastatin  20 mg Oral q1800   rifaximin  550 mg Oral BID   venlafaxine XR  37.5 mg Oral Daily   Continuous Infusions:   LOS: 8 days   Roxan Hockey, MD [facility]  If 7PM-7AM, please contact night-coverage  08/23/2021, 5:59 PM

## 2021-08-23 NOTE — TOC Progression Note (Signed)
Transition of Care [facility]) - Progression Note    Patient Details  Name: KASPER MUDRICK MRN: [id number] Date of Birth: [birth year]  [facility]) CM/SW Contact  Natasha Bence, LCSW Phone Number: 08/23/2021, 1:36 PM  Clinical Narrative:    CSW contacted Debbie with [facility]. Debbie reported auth not yet received. TOC to follow.   Expected Discharge Plan: [place] Barriers to Discharge: Continued Medical Work up  Expected Discharge Plan and Services Expected Discharge Plan: [place]         Expected Discharge Date: 08/21/21                                     Social Determinants of Health (SDOH) Interventions    Readmission Risk Interventions Readmission Risk Prevention Plan 07/22/2021 03/24/2021 12/10/2020  Transportation Screening Complete Complete Complete  HRI or Home Care Consult Complete - Complete  Social Work Consult for [place] Planning/Counseling Complete - Complete  Palliative Care Screening Not Applicable - Not Applicable  Medication Review Press photographer) Complete Complete Complete  PCP or Specialist appointment within 3-5 days of discharge - Complete -  [place] or [place] - Complete -  SW Recovery Care/Counseling Consult - Complete -  Palliative Care Screening - Not Applicable -  [place] - Not Applicable -  Some recent data might be hidden

## 2021-08-24 ENCOUNTER — Ambulatory Visit (HOSPITAL_COMMUNITY): Payer: Federal, State, Local not specified - PPO | Attending: Internal Medicine | Admitting: Physical Therapy

## 2021-08-24 LAB — GLUCOSE, CAPILLARY
Glucose-Capillary: 133 mg/dL — ABNORMAL HIGH (ref 70–99)
Glucose-Capillary: 197 mg/dL — ABNORMAL HIGH (ref 70–99)
Glucose-Capillary: 212 mg/dL — ABNORMAL HIGH (ref 70–99)
Glucose-Capillary: 184 mg/dL — ABNORMAL HIGH (ref 70–99)

## 2021-08-24 LAB — CBC
HCT: 28.7 % — ABNORMAL LOW (ref 39.0–52.0)
Hemoglobin: 9 g/dL — ABNORMAL LOW (ref 13.0–17.0)
MCH: 29.7 pg (ref 26.0–34.0)
MCHC: 31.4 g/dL (ref 30.0–36.0)
MCV: 94.7 fL (ref 80.0–100.0)
Platelets: 136 10*3/uL — ABNORMAL LOW (ref 150–400)
RBC: 3.03 MIL/uL — ABNORMAL LOW (ref 4.22–5.81)
RDW: 17.5 % — ABNORMAL HIGH (ref 11.5–15.5)
WBC: 3.5 10*3/uL — ABNORMAL LOW (ref 4.0–10.5)
nRBC: 0 % (ref 0.0–0.2)

## 2021-08-24 MED ORDER — SODIUM CHLORIDE 0.9 % IV SOLN
250.0000 mg | Freq: Once | INTRAVENOUS | Status: AC
  Administered 2021-08-24: 250 mg via INTRAVENOUS
  Filled 2021-08-24: qty 20

## 2021-08-24 NOTE — Progress Notes (Signed)
Physical Therapy Treatment Patient Details Name: [name] MRN: [id number] DOB: 01/01/1961 Today's Date: 08/24/2021   History of Present Illness [name] is a 60 y.o. male who presented to the ED after falling at home.  He states that he was using his walker when he tripped over a rug.  He is unsure of losing consciousness after hitting his head, but he did have difficulty getting off the floor due to low back pain.  He denies any other recent events and states that he lives with his wife at home and takes his medications as prescribed.  Wife states that he has been getting more deconditioned lately and has been falling more frequently. PMH: CAD with prior CABG, cirrhosis, hypertension, dyslipidemia, OSA on CPAP, thrombocytopenia, and type 2 diabetes    PT Comments    Pt progressing with ambulation tolerance, requiring min guard with RW. Pt continues to require mod A to power up from low seated recliner, heavy use of UE to power up. Pt tolerates LE strengthening exercises with rest breaks between sets. Pt c/o L knee pain in weight-bearing, no buckling or functional deficits noted. Pt continues to be agreeable to SNF for strengthening prior to return home. Continue to progress as able.   Recommendations for follow up therapy are one component of a multi-disciplinary discharge planning process, led by the attending physician.  Recommendations may be updated based on patient status, additional functional criteria and insurance authorization.  Follow Up Recommendations  SNF     Equipment Recommendations  None recommended by PT    Recommendations for Other Services       Precautions / Restrictions Precautions Precautions: Fall Restrictions Weight Bearing Restrictions: No     Mobility  Bed Mobility  General bed mobility comments: in recliner    Transfers Overall transfer level: Needs assistance Equipment used: Rolling walker (2 wheeled) Transfers: Sit to/from  Stand Sit to Stand: Mod assist  General transfer comment: mod A to power to stand with heavy use of UEs and therapist assisting to power up  Ambulation/Gait Ambulation/Gait assistance: Min guard Gait Distance (Feet): 60 Feet Assistive device: Rolling walker (2 wheeled) Gait Pattern/deviations: Step-through pattern;Decreased stride length;Wide base of support Gait velocity: decreased   General Gait Details: slow, short steps with equal bil foot clearance, wide BOS, slightly labored limiting tolerance, L knee pain without buckling noted   Stairs             Wheelchair Mobility    Modified Rankin (Stroke Patients Only)       Balance Overall balance assessment: Needs assistance Sitting-balance support: Feet supported Sitting balance-Leahy Scale: Good Sitting balance - Comments: seated at EOB   Standing balance support: During functional activity;Bilateral upper extremity supported Standing balance-Leahy Scale: Poor Standing balance comment: reliant on UE support         Cognition Arousal/Alertness: Awake/alert Behavior During Therapy: WFL for tasks assessed/performed Overall Cognitive Status: Within Functional Limits for tasks assessed       Exercises General Exercises - Lower Extremity Long Arc Quad: Seated;AROM;Strengthening;Both;15 reps Hip Flexion/Marching: Seated;AROM;Strengthening;15 reps;Both Toe Raises: Seated;AROM;Strengthening;Both;15 reps Heel Raises: Seated;AROM;Strengthening;Both;15 reps    General Comments        Pertinent Vitals/Pain Pain Assessment: Faces Faces Pain Scale: Hurts a little bit Pain Location: left knee Pain Descriptors / Indicators: Aching;Sore Pain Intervention(s): Limited activity within patient's tolerance;Monitored during session    Home Living  Prior Function            PT Goals (current goals can now be found in the care plan section) Acute Rehab PT Goals Patient Stated Goal: Return  home PT Goal Formulation: With patient Time For Goal Achievement: 08/27/21 Potential to Achieve Goals: Good Progress towards PT goals: Progressing toward goals    Frequency    Min 3X/week      PT Plan Current plan remains appropriate    Co-evaluation              AM-PAC PT "6 Clicks" Mobility   Outcome Measure  Help needed turning from your back to your side while in a flat bed without using bedrails?: A Little Help needed moving from lying on your back to sitting on the side of a flat bed without using bedrails?: A Little Help needed moving to and from a bed to a chair (including a wheelchair)?: A Little Help needed standing up from a chair using your arms (e.g., wheelchair or bedside chair)?: A Lot Help needed to walk in hospital room?: A Little Help needed climbing 3-5 steps with a railing? : A Lot 6 Click Score: 16    End of Session Equipment Utilized During Treatment: Gait belt Activity Tolerance: Patient tolerated treatment well;Patient limited by fatigue Patient left: in bed;with call bell/phone within reach (seated EOB) Nurse Communication: Mobility status PT Visit Diagnosis: Unsteadiness on feet (R26.81);Other abnormalities of gait and mobility (R26.89);Muscle weakness (generalized) (M62.81)     Time: 2263-3354 PT Time Calculation (min) (ACUTE ONLY): 28 min  Charges:  $Gait Training: 8-22 mins $Therapeutic Exercise: 8-22 mins                      Talbot Grumbling PT, DPT 08/24/21, 2:37 PM

## 2021-08-24 NOTE — Progress Notes (Signed)
PROGRESS NOTE    [name]  TSV:[id number] DOB: 01-06-1961 DOA: 08/11/2021 PCP: [name], MD   Brief Narrative:   60 y.o. WM PMHx CAD with prior CABG, cirrhosis, hypertension, dyslipidemia, OSA on CPAP, thrombocytopenia, and type 2 diabetes comes to the hospital with complains with fall.  Found to have kalemia, elevated ammonia and thrombocytopenia.  His symptoms started improving with IV fluids, electrolyte supplementation.  PT/OT recommended SNF therefore arrangements been made. --Remains medically stable for discharge to SNF when insurance approval for SNF placement is obtained  Assessment & Plan:   Active Problems:   Essential hypertension   OSA on CPAP   Esophageal varices (HCC)   Liver failure (HCC)   Unspecified cirrhosis of liver (HCC)   Type 2 diabetes mellitus with hyperglycemia (HCC)   Hypotension   Chronic diastolic heart failure (HCC)   Hyperlipidemia associated with type 2 diabetes mellitus (HCC)   Back pain   Anemia   Obstructive sleep apnea (adult) (pediatric)   Hypokalemia   Muscle spasm   CAD (coronary artery disease)   HLD (hyperlipidemia)   Chronic idiopathic thrombocytopenia (HCC)   Increased ammonia level   Obese   Fall at home, initial encounter   Hypotension due to hypovolemia   Generalized weakness and falls Hypotension, improved - Secondary to electrolyte imbalance and dehydration.  This is improved with IV fluids.  Currently on midodrine.  Also required albumin.  Electrolytes have improved.   PT/OT - SNF --Remains medically stable for discharge to SNF when insurance approval for SNF placement is obtained  Chronic thrombocytopenia/pancytopenia - Secondary to underlying liver disease.   Platelets -stable around 120k  -Hemoglobin stable -Patient with iron infusions from time to time -Discussed with Dr. Delton Coombes he recommends iron infusion on 08/24/21 -  CAD status post CABG  Currently chest pain-free -Continue pravastatin,  aspirin and nadolol  Cirrhosis of the liver; NASH Hx of EV s/p EVL 07/2021 - Encephalopathy mostly resolved with on daily lactulose, rifaximin -Continue nadolol -Ammonia has been stable around 40 -Torsemide and Aldactone as ordered  Diabetes mellitus type 2 with hyperglycemia - Continue Lantus insulin as well as Use Novolog/Humalog Sliding scale insulin with Accu-Cheks/Fingersticks as ordered   OSA - Nightly CPAP  Generalized weakness,  and ambulatory dysfunction - Recurrent falls---PTA pt lived alone and did very poorly, patient has significant limitations with mobility related ADLs- this patient needs to continue to be monitored in the hospital until a SNF bed is obtained as she is not safe to go home with her current physcical limitations  DVT prophylaxis: SCDs Code Status: Full code Family Communication:  Wife updated   Status is: Inpatient  Remains inpatient appropriate because: Pending placement, safe dc planning.  -Remains medically stable for discharge to SNF when insurance approval for SNF placement is obtained Dispo: The patient is from: Home              Anticipated d/c is to: SNF              Patient currently is medically stable to d/c. TOC working on placement   Difficult to place patient No     Subjective:  -c/o fatigue Had BM --Participated in physical therapy  Examination:  Physical Exam  Gen:- Awake Alert,  in no apparent distress  HEENT:- Gray.AT, No sclera icterus Neck-Supple Neck,No JVD,.  Lungs-  CTAB, no wheezing CV- S1, S2 normal, 3/6 SM, RRR Abd-  +ve B.Sounds, Abd Soft, No tenderness,    Extremity/Skin:- trace  edema, good pulses  Psych-affect is appropriate, oriented x3 Neuro-Generalized weakness, no new focal deficits, no tremors  Objective: Vitals:   08/23/21 1449 08/23/21 2114 08/24/21 0513 08/24/21 1421  BP: 118/69 119/73 102/67 112/70  Pulse: 70 71 69 76  Resp: 20 20 18 17   Temp: 98.2 F (36.8 C) 97.7 F (36.5 C) 97.9 F (36.6  C) 98.6 F (37 C)  TempSrc: Oral Oral Oral Oral  SpO2: 99% 100% 98% 99%  Weight:      Height:        Intake/Output Summary (Last 24 hours) at 08/24/2021 1658 Last data filed at 08/24/2021 1300 Gross per 24 hour  Intake 480 ml  Output 2100 ml  Net -1620 ml   Filed Weights   08/20/21 0536 08/21/21 0500 08/23/21 0600  Weight: 124.7 kg 124.8 kg 125.2 kg   Data Reviewed:   CBC: Recent Labs  Lab 08/18/21 0532 08/19/21 0521 08/20/21 0543 08/21/21 0521 08/22/21 0559 08/24/21 0405  WBC 3.3* 3.6* 3.4* 3.6* 3.3* 3.5*  NEUTROABS 2.0 2.0 2.1 2.1 2.0  --   HGB 8.5* 8.4* 8.8* 8.5* 8.4* 9.0*  HCT 27.6* 28.4* 29.3* 27.7* 27.8* 28.7*  MCV 96.2 96.9 97.7 96.2 96.9 94.7  PLT 105* 112* 119* 122* 125* 427*   Basic Metabolic Panel: Recent Labs  Lab 08/18/21 0532 08/19/21 0521 08/20/21 0543 08/21/21 0521 08/22/21 0559  NA 134* 136 135 136 137  K 4.3 4.1 4.1 4.0 4.0  CL 106 106 104 106 107  CO2 23 25 25 24 25   GLUCOSE 145* 100* 105* 149* 99  BUN 13 12 12 14 12   CREATININE 1.09 1.14 1.13 1.09 1.09  CALCIUM 8.4* 8.4* 8.5* 8.4* 8.5*  MG 2.0 2.0 1.9 1.9 1.9  PHOS 3.0 2.8 2.9 2.7 3.1   GFR: Estimated Creatinine Clearance: 94.5 mL/min (by C-G formula based on SCr of 1.09 mg/dL). Liver Function Tests: Recent Labs  Lab 08/18/21 0532 08/19/21 0521 08/20/21 0543 08/21/21 0521 08/22/21 0559  AST 27 24 24 21 23   ALT 11 10 9 8 9   ALKPHOS 68 67 72 70 73  BILITOT 1.2 1.1 1.1 1.0 0.8  PROT 6.2* 5.9* 6.2* 6.3* 6.2*  ALBUMIN 2.5* 2.5* 2.5* 2.5* 2.5*   No results for input(s): LIPASE, AMYLASE in the last 168 hours. Recent Labs  Lab 08/18/21 0532 08/19/21 0521 08/20/21 0543 08/21/21 0521 08/22/21 0559  AMMONIA 72* 43* 42* 53* 42*   Coagulation Profile: Recent Labs  Lab 08/19/21 0521 08/20/21 0543 08/21/21 0521 08/22/21 0559 08/23/21 0459  INR 1.3* 1.2 1.3* 1.3* 1.3*   Cardiac Enzymes: No results for input(s): CKTOTAL, CKMB, CKMBINDEX, TROPONINI in the last 168  hours. BNP (last 3 results) No results for input(s): PROBNP in the last 8760 hours. HbA1C: No results for input(s): HGBA1C in the last 72 hours. CBG: Recent Labs  Lab 08/23/21 1637 08/23/21 2112 08/24/21 0725 08/24/21 1121 08/24/21 1650  GLUCAP 160* 215* 133* 197* 212*   Lipid Profile: No results for input(s): CHOL, HDL, LDLCALC, TRIG, CHOLHDL, LDLDIRECT in the last 72 hours. Thyroid Function Tests: No results for input(s): TSH, T4TOTAL, FREET4, T3FREE, THYROIDAB in the last 72 hours. Anemia Panel: No results for input(s): VITAMINB12, FOLATE, FERRITIN, TIBC, IRON, RETICCTPCT in the last 72 hours. Sepsis Labs: No results for input(s): PROCALCITON, LATICACIDVEN in the last 168 hours.  No results found for this or any previous visit (from the past 240 hour(s)).    Radiology Studies: No results found.   Scheduled Meds:  aspirin EC  81 mg Oral QHS   ferrous sulfate  325 mg Oral q AM   insulin aspart  0-5 Units Subcutaneous QHS   insulin aspart  0-6 Units Subcutaneous TID WC   insulin aspart  4 Units Subcutaneous TID WC   insulin glargine-yfgn  30 Units Subcutaneous QHS   insulin glargine-yfgn  40 Units Subcutaneous Daily   lactulose  20 g Oral Daily   lidocaine  4 patch Transdermal Q24H   magnesium oxide  400 mg Oral Daily   midodrine  10 mg Oral TID WC   nadolol  20 mg Oral Daily   pantoprazole  40 mg Oral BID AC   potassium chloride SA  20 mEq Oral BID   pravastatin  20 mg Oral q1800   rifaximin  550 mg Oral BID   spironolactone  12.5 mg Oral Daily   torsemide  20 mg Oral Daily   venlafaxine XR  37.5 mg Oral Daily   Continuous Infusions:  ferric gluconate (FERRLECIT) IVPB       LOS: 9 days   Roxan Hockey, MD [facility]  If 7PM-7AM, please contact night-coverage  08/24/2021, 4:58 PM

## 2021-08-25 DIAGNOSIS — E1365 Other specified diabetes mellitus with hyperglycemia: Secondary | ICD-10-CM | POA: Diagnosis not present

## 2021-08-25 DIAGNOSIS — E861 Hypovolemia: Secondary | ICD-10-CM | POA: Diagnosis not present

## 2021-08-25 DIAGNOSIS — E785 Hyperlipidemia, unspecified: Secondary | ICD-10-CM | POA: Diagnosis not present

## 2021-08-25 DIAGNOSIS — K7460 Unspecified cirrhosis of liver: Secondary | ICD-10-CM | POA: Diagnosis not present

## 2021-08-25 DIAGNOSIS — E860 Dehydration: Secondary | ICD-10-CM | POA: Diagnosis not present

## 2021-08-25 DIAGNOSIS — W19XXXD Unspecified fall, subsequent encounter: Secondary | ICD-10-CM | POA: Diagnosis not present

## 2021-08-25 DIAGNOSIS — Y92009 Unspecified place in unspecified non-institutional (private) residence as the place of occurrence of the external cause: Secondary | ICD-10-CM | POA: Diagnosis not present

## 2021-08-25 DIAGNOSIS — I5032 Chronic diastolic (congestive) heart failure: Secondary | ICD-10-CM | POA: Diagnosis not present

## 2021-08-25 DIAGNOSIS — I2510 Atherosclerotic heart disease of native coronary artery without angina pectoris: Secondary | ICD-10-CM | POA: Diagnosis not present

## 2021-08-25 DIAGNOSIS — W19XXXA Unspecified fall, initial encounter: Secondary | ICD-10-CM | POA: Diagnosis not present

## 2021-08-25 DIAGNOSIS — E876 Hypokalemia: Secondary | ICD-10-CM | POA: Diagnosis not present

## 2021-08-25 DIAGNOSIS — M545 Low back pain, unspecified: Secondary | ICD-10-CM | POA: Diagnosis not present

## 2021-08-25 DIAGNOSIS — I10 Essential (primary) hypertension: Secondary | ICD-10-CM | POA: Diagnosis not present

## 2021-08-25 DIAGNOSIS — M6281 Muscle weakness (generalized): Secondary | ICD-10-CM | POA: Diagnosis not present

## 2021-08-25 DIAGNOSIS — E669 Obesity, unspecified: Secondary | ICD-10-CM | POA: Diagnosis not present

## 2021-08-25 DIAGNOSIS — E870 Hyperosmolality and hypernatremia: Secondary | ICD-10-CM | POA: Diagnosis not present

## 2021-08-25 DIAGNOSIS — G4733 Obstructive sleep apnea (adult) (pediatric): Secondary | ICD-10-CM | POA: Diagnosis not present

## 2021-08-25 LAB — GLUCOSE, CAPILLARY
Glucose-Capillary: 99 mg/dL (ref 70–99)
Glucose-Capillary: 129 mg/dL — ABNORMAL HIGH (ref 70–99)

## 2021-08-25 MED ORDER — POTASSIUM CHLORIDE ER 10 MEQ PO TBCR: 10.0000 meq | EXTENDED_RELEASE_TABLET | Freq: Every day | ORAL | 2 refills | Status: DC

## 2021-08-25 MED ORDER — RIFAXIMIN 550 MG PO TABS: 550.0000 mg | ORAL_TABLET | Freq: Two times a day (BID) | ORAL | 5 refills | Status: DC

## 2021-08-25 MED ORDER — MIDODRINE HCL 10 MG PO TABS: 10.0000 mg | ORAL_TABLET | Freq: Three times a day (TID) | ORAL | 5 refills | Status: DC

## 2021-08-25 MED ORDER — VENLAFAXINE HCL ER 75 MG PO CP24: 75.0000 mg | ORAL_CAPSULE | Freq: Every day | ORAL | 3 refills | Status: DC

## 2021-08-25 MED ORDER — TORSEMIDE 20 MG PO TABS
20.0000 mg | ORAL_TABLET | Freq: Every day | ORAL | 1 refills | Status: DC
Start: 2021-08-26 — End: 2022-09-13

## 2021-08-25 MED ORDER — METHOCARBAMOL 500 MG PO TABS: 500.0000 mg | ORAL_TABLET | Freq: Three times a day (TID) | ORAL | 0 refills | Status: AC

## 2021-08-25 MED ORDER — ALPRAZOLAM 0.5 MG PO TABS: 0.5000 mg | ORAL_TABLET | Freq: Every day | ORAL | 0 refills | Status: DC | PRN

## 2021-08-25 MED ORDER — SPIRONOLACTONE 25 MG PO TABS: 12.5000 mg | ORAL_TABLET | Freq: Every day | ORAL | 3 refills | Status: DC

## 2021-08-25 MED ORDER — ONDANSETRON HCL 4 MG PO TABS
4.0000 mg | ORAL_TABLET | Freq: Four times a day (QID) | ORAL | 0 refills | Status: DC | PRN
Start: 2021-08-25 — End: 2021-10-06

## 2021-08-25 MED ORDER — PANTOPRAZOLE SODIUM 40 MG PO TBEC: 40.0000 mg | DELAYED_RELEASE_TABLET | Freq: Every day | ORAL | 5 refills | Status: AC

## 2021-08-25 MED ORDER — ASPIRIN EC 81 MG PO TBEC: 81.0000 mg | DELAYED_RELEASE_TABLET | Freq: Every day | ORAL | 11 refills | Status: DC

## 2021-08-25 MED ORDER — FERROUS SULFATE 325 (65 FE) MG PO TABS: 325.0000 mg | ORAL_TABLET | Freq: Every day | ORAL | 3 refills | Status: DC

## 2021-08-25 MED ORDER — MAGNESIUM OXIDE -MG SUPPLEMENT 400 (240 MG) MG PO TABS
400.0000 mg | ORAL_TABLET | Freq: Every day | ORAL | 2 refills | Status: DC
Start: 2021-08-26 — End: 2022-10-23

## 2021-08-25 NOTE — Discharge Instructions (Signed)
1)Avoid ibuprofen/Advil/Aleve/Motrin/Goody Powders/Naproxen/BC powders/Meloxicam/Diclofenac/Indomethacin and other Nonsteroidal anti-inflammatory medications as these will make you more likely to bleed and can cause stomach ulcers, can also cause Kidney problems.   2)Repeat CMP (Chem 14) , CBC and PT/INR in 1 week---

## 2021-08-25 NOTE — TOC Transition Note (Signed)
Transition of Care [facility]) - CM/SW Discharge Note   Patient Details  Name: ADOLPHUS HANF MRN: [id number] Date of Birth: January 04, 1961  Transition of Care [facility]) CM/SW Contact:  Boneta Lucks, RN Phone Number: 08/25/2021, 2:26 PM   Clinical Narrative:   Insurance Auth received, Debbie provided room # M46 Bed 2 at [facility]. RN to call report. Patient can transport by wheelchair, unit is aware to call [facility] for transport. MD and patient has call his wife.   Final next level of care: Skilled Nursing Facility Barriers to Discharge: Barriers Resolved   Patient Goals and CMS Choice Patient states their goals for this hospitalization and ongoing recovery are:: spouse and patient feel they cannot maange level of care at home and want short term rehab CMS Medicare.gov Compare Post Acute Care list provided to:: Patient Represenative (must comment) Choice offered to / list presented to : Patient, Spouse  Discharge Placement              Patient chooses bed at:  [facility]) Patient to be transferred to facility by: [facility]   Patient and family notified of of transfer: 08/25/21  Discharge Plan and Services   [place]   Readmission Risk Interventions Readmission Risk Prevention Plan 08/25/2021 07/22/2021 03/24/2021  Transportation Screening Complete Complete Complete  HRI or Home Care Consult - Complete -  Social Work Consult for [place] Planning/Counseling - Complete -  Palliative Care Screening - Not Applicable -  Medication Review Press photographer) Complete Complete Complete  PCP or Specialist appointment within 3-5 days of discharge - - Complete  HRI or Home Care Consult Complete - Complete  SW Recovery Care/Counseling Consult Complete - Complete  Palliative Care Screening Not Applicable - Not Applicable  Skilled Nursing Facility Complete - Not Applicable  Some recent data might be hidden

## 2021-08-25 NOTE — Discharge Summary (Signed)
[name], is a 60 y.o. male  DOB [birth year]  MRN [id number].  Admission date:  08/11/2021  Admitting Physician  Bernadette Hoit, DO  Discharge Date:  08/25/2021   Primary MD  [name], MD  Recommendations for primary care physician for things to follow:   1)Avoid ibuprofen/Advil/Aleve/Motrin/Goody Powders/Naproxen/BC powders/Meloxicam/Diclofenac/Indomethacin and other Nonsteroidal anti-inflammatory medications as these will make you more likely to bleed and can cause stomach ulcers, can also cause Kidney problems.   2)Repeat CMP (Chem 14) , CBC and PT/INR in 1 week---   Admission Diagnosis  Dehydration [E86.0] Hypokalemia [E87.6]   Discharge Diagnosis  Dehydration [E86.0] Hypokalemia [E87.6]    Principal Problem:   Fall at home/Back Pain and Ambulatory Dysfunction Active Problems:   Chronic diastolic heart failure (HCC)   NASH cirrhosis of liver (HCC)   Type 2 diabetes mellitus with hyperglycemia (HCC)   Back pain   CAD (coronary artery disease)   Chronic idiopathic thrombocytopenia (HCC)   Essential hypertension   OSA on CPAP   Esophageal varices (HCC)   Liver failure (HCC)   Hypotension   Hyperlipidemia associated with type 2 diabetes mellitus (HCC)   Anemia   Obstructive sleep apnea (adult) (pediatric)   Hypokalemia   Muscle spasm   HLD (hyperlipidemia)   Increased ammonia level   Obese   Hypotension due to hypovolemia      Past Medical History:  Diagnosis Date   Anemia    Arthritis    CAD (coronary artery disease)    Multivessel disease status post CABG 08/2015   Cirrhosis (HCC)    CKD (chronic kidney disease) stage 3, GFR 30-59 ml/min (HCC)    Diastolic CHF (HCC)    Essential hypertension    Hyperlipidemia    Iron deficiency anemia 07/15/2021   OSA on CPAP    Polyclonal gammopathy    Thrombocytopenia (HCC) 2016   Type 2 diabetes mellitus (HCC)     Past  Surgical History:  Procedure Laterality Date   APPENDECTOMY     Biceps tendon surgery Right    BIOPSY  11/22/2019   Procedure: BIOPSY;  Surgeon: Daneil Dolin, MD;  [facility];  Service: Endoscopy;;   CARDIAC CATHETERIZATION N/A 08/25/2015   Procedure: Left Heart Cath and Coronary Angiography;  Surgeon: Belva Crome, MD;  Location: [place] [facility];  Service: Cardiovascular;  Laterality: N/A;   COLONOSCOPY WITH PROPOFOL N/A 11/22/2019   Procedure: COLONOSCOPY WITH PROPOFOL;  Surgeon: Daneil Dolin, MD; Four 4-5 mm polyps, findings suggestive of portal colopathy, congested appearing colonic mucosa diffusely, rectal varices, and adequate right colon prep.  Pathology with tubular adenomas and hyperplastic polyp.  Right colon biopsy with focal active colitis.  Recommendations to repeat colonoscopy in 3 months due to poor prep.   COLONOSCOPY WITH PROPOFOL N/A 03/17/2020   Procedure: COLONOSCOPY WITH PROPOFOL;  Surgeon: Daneil Dolin, MD;  [facility];  Service: Endoscopy;  Laterality: N/A;  8:45am - pt does not need covid test, was + 2/4 <  90 days   CORONARY ARTERY BYPASS GRAFT N/A 08/29/2015   Procedure: CORONARY ARTERY BYPASS GRAFTING (CABG);  Surgeon: [name], MD;  Location: [place] [facility];  Service: Open Heart Surgery;  Laterality: N/A;   ESOPHAGEAL BANDING N/A 07/23/2021   Procedure: ESOPHAGEAL BANDING;  Surgeon: Eloise Harman, DO;  [facility];  Service: Endoscopy;  Laterality: N/A;   ESOPHAGOGASTRODUODENOSCOPY (EGD) WITH PROPOFOL N/A 11/22/2019   Procedure: ESOPHAGOGASTRODUODENOSCOPY (EGD) WITH PROPOFOL;  Surgeon: Daneil Dolin, MD; 4 columns of grade 2-3 esophageal varices, portal gastropathy, gastric polyp/abnormal gastric mucosa s/p biopsy.  Pathology with hyperplastic polyp, mild chronic gastritis, negative H. pylori.   ESOPHAGOGASTRODUODENOSCOPY (EGD) WITH PROPOFOL N/A 07/23/2021   Procedure: ESOPHAGOGASTRODUODENOSCOPY (EGD) WITH PROPOFOL;   Surgeon: Eloise Harman, DO;  [facility];  Service: Endoscopy;  Laterality: N/A;   KNEE ARTHROSCOPY Left    TEE WITHOUT CARDIOVERSION N/A 08/29/2015   Procedure: TRANSESOPHAGEAL ECHOCARDIOGRAM (TEE);  Surgeon: [name], MD;  Location: [place];  Service: Open Heart Surgery;  Laterality: N/A;   TOTAL KNEE ARTHROPLASTY Left 03/09/2017   Procedure: TOTAL KNEE ARTHROPLASTY;  Surgeon: Carole Civil, MD;  Location: [facility];  Service: Orthopedics;  Laterality: Left;      HPI  from the history and physical done on the day of admission:    Patient coming from: Home   Chief Complaint: Fall   HPI: [name] is a 60 y.o. male with medical history significant for CAD with prior CABG, cirrhosis, hypertension, dyslipidemia, OSA on CPAP, thrombocytopenia, and type 2 diabetes who presented to the ED after falling at home.  He states that he was using his walker when he tripped over a rug.  He is unsure of losing consciousness after hitting his head, but he did have difficulty getting off the floor due to low back pain.  He denies any other recent events and states that he lives with his wife at home and takes his medications as prescribed.  Wife states that he has been getting more deconditioned lately and has been falling more frequently.   ED Course: Stable vital signs noted and patient is afebrile.  Potassium is 2.4, BUN 31, creatinine 1.53, platelets 88,000, sodium 131, and ammonia 42.  EKG with no significant changes identified.  He has been given lactulose enema in the ED as well as some potassium supplementation.   Review of Systems: Reviewed as noted above, otherwise negative.     Hospital Course:     Generalized weakness and falls Hypotension, improved - Secondary to electrolyte imbalance and dehydration.  This is improved with IV fluids.   --Currently on midodrine.  Also required albumin.  Electrolytes have improved.   PT/OT - SNF ---Remains medically  stable for discharge to SNF Rehab   Chronic thrombocytopenia/pancytopenia - Secondary to underlying liver disease.   Platelets -stable around 120k  -Hemoglobin stable -Patient with iron infusions from time to time -Discussed with Dr. Dana Allan received iron infusion on 08/24/21 -  CAD status post CABG  Currently chest pain-free -Continue pravastatin, aspirin and nadolol   Cirrhosis of the liver; NASH Hx of EV s/p EVL 07/2021 - Encephalopathy mostly resolved with on daily lactulose, rifaximin -Continue nadolol -Ammonia has been stable around 40 -Torsemide and Aldactone as ordered   Diabetes mellitus type 2 with hyperglycemia - Continue insulin Regimen  OSA - Nightly CPAP   Generalized weakness and ambulatory dysfunction - Recurrent falls---PTA pt lived alone and did very poorly, patient has  significant limitations with mobility related ADLs- this patient needs to continue to be monitored in the hospital until a SNF bed is obtained as she is not safe to go home with her current physcical limitations  Code Status: Full code Family Communication:  Wife updated by Phone on /08/25/2021   -Remains medically stable for discharge to SNF Rehab Dispo: The patient is from: Home              Anticipated d/c is to: SNF  Discharge Condition: stable   Follow UP   Contact information for after-discharge care     [facility] .   Service: Skilled Nursing Contact information: [address] [place] [phone]                     Consults obtained - Na  Diet and Activity recommendation:  As advised  Discharge Instructions    Discharge Instructions     Call MD for:  difficulty breathing, headache or visual disturbances   Complete by: As directed    Call MD for:  persistant dizziness or light-headedness   Complete by: As directed    Call MD for:  persistant nausea and vomiting   Complete  by: As directed    Call MD for:  severe uncontrolled pain   Complete by: As directed    Call MD for:  temperature >100.4   Complete by: As directed    Diet - low sodium heart healthy   Complete by: As directed    Discharge instructions   Complete by: As directed    1)Avoid ibuprofen/Advil/Aleve/Motrin/Goody Powders/Naproxen/BC powders/Meloxicam/Diclofenac/Indomethacin and other Nonsteroidal anti-inflammatory medications as these will make you more likely to bleed and can cause stomach ulcers, can also cause Kidney problems.   2)Repeat CMP (Chem 14) , CBC and PT/INR in 1 week---   Increase activity slowly   Complete by: As directed        Discharge Medications     Allergies as of 08/25/2021       Reactions   Fluticasone Rash        Medication List     STOP taking these medications    Elderberry Zinc/Vit C/Immune Lozg   Flaxseed Oil 1000 MG Caps   Magnesium 250 MG Tabs Replaced by: magnesium oxide 400 (240 Mg) MG tablet   sucralfate 1 g tablet Commonly known as: Carafate   TURMERIC CURCUMIN PO       TAKE these medications    acetaminophen 325 MG tablet Commonly known as: TYLENOL Take 650 mg by mouth every 6 (six) hours as needed for mild pain.   albuterol 108 (90 Base) MCG/ACT inhaler Commonly known as: VENTOLIN HFA Inhale 2 puffs into the lungs every 6 (six) hours as needed for wheezing or shortness of breath.   ALPRAZolam 0.5 MG tablet Commonly known as: XANAX Take 1 tablet (0.5 mg total) by mouth daily as needed.   aspirin EC 81 MG tablet Take 1 tablet (81 mg total) by mouth daily with breakfast. What changed: when to take this   Dialyvite Vitamin D 5000 125 MCG (5000 UT) capsule Generic drug: Cholecalciferol Take 5,000 Units by mouth in the morning.   ferrous sulfate 325 (65 FE) MG tablet Take 1 tablet (325 mg total) by mouth daily with breakfast. What changed: when to take this   insulin aspart 100 UNIT/ML injection Commonly known as:  novoLOG Inject 5 Units into the  skin 3 (three) times daily before meals. Special Instructions: If accu-check is greater than 150. Hold for accu-check 150 or below. With Meals   lactulose 10 GM/15ML solution Commonly known as: CHRONULAC Take 30 mLs (20 g total) by mouth daily.   lidocaine 2 % solution Commonly known as: XYLOCAINE 5 mLs as needed.   loratadine 10 MG tablet Commonly known as: CLARITIN Take 10 mg by mouth in the morning.   magnesium oxide 400 (240 Mg) MG tablet Commonly known as: MAG-OX Take 1 tablet (400 mg total) by mouth daily. Start taking on: August 26, 2021 Replaces: Magnesium 250 MG Tabs   methocarbamol 500 MG tablet Commonly known as: ROBAXIN Take 1 tablet (500 mg total) by mouth 3 (three) times daily.   midodrine 10 MG tablet Commonly known as: PROAMATINE Take 1 tablet (10 mg total) by mouth 3 (three) times daily with meals.   nadolol 20 MG tablet Commonly known as: CORGARD Take by mouth daily.   ondansetron 4 MG tablet Commonly known as: ZOFRAN Take 1 tablet (4 mg total) by mouth every 6 (six) hours as needed for nausea.   Ozempic (1 MG/DOSE) 4 MG/3ML Sopn Generic drug: Semaglutide (1 MG/DOSE) Inject 1 mg into the skin once a week.   pantoprazole 40 MG tablet Commonly known as: PROTONIX Take 1 tablet (40 mg total) by mouth daily. What changed: when to take this   potassium chloride 10 MEQ tablet Commonly known as: KLOR-CON Take 1 tablet (10 mEq total) by mouth daily. Take While taking Torsemide   pravastatin 20 MG tablet Commonly known as: PRAVACHOL TAKE 1 TABLET(20 MG) BY MOUTH AT BEDTIME   rifaximin 550 MG Tabs tablet Commonly known as: XIFAXAN Take 1 tablet (550 mg total) by mouth 2 (two) times daily.   spironolactone 25 MG tablet Commonly known as: ALDACTONE Take 0.5 tablets (12.5 mg total) by mouth daily. Start taking on: August 26, 2021   torsemide 20 MG tablet Commonly known as: DEMADEX Take 1 tablet (20 mg total) by  mouth daily. Start taking on: August 26, 2021   Tyler Aas FlexTouch 200 UNIT/ML FlexTouch Pen Generic drug: insulin degludec Inject 40 Units into the skin in the morning. Give 30 units Subcutaneous  at Bedtime 8:00 pm   venlafaxine XR 75 MG 24 hr capsule Commonly known as: EFFEXOR-XR Take 1 capsule (75 mg total) by mouth daily with breakfast. What changed:  medication strength how much to take when to take this       Major procedures and Radiology Reports - PLEASE review detailed and final reports for all details, in brief -   DG Chest 1 View  Result Date: 08/11/2021 CLINICAL DATA:  Fall.  Pain. EXAM: CHEST  1 VIEW COMPARISON:  07/28/2021 FINDINGS: Low lung volumes with asymmetric elevation right hemidiaphragm. The cardio pericardial silhouette is enlarged. There is pulmonary vascular congestion hazy opacity in the upper lungs bilaterally. Interstitial markings are diffusely coarsened with chronic features. The lungs are clear without focal pneumonia, edema, pneumothorax or pleural effusion. The visualized bony structures of the thorax show no acute abnormality. Telemetry leads overlie the chest. IMPRESSION: Low lung volumes hazy opacity over the upper lungs bilaterally, potentially related to technique although pneumonia not excluded. Dedicated upright PA and lateral chest x-ray recommended to further evaluate. Electronically Signed   By: Misty Stanley M.D.   On: 08/11/2021 05:45   DG Thoracic Spine 2 View  Result Date: 08/11/2021 CLINICAL DATA:  Fall.  Pain. EXAM: THORACIC SPINE 2 VIEWS  COMPARISON:  None. FINDINGS: Study limited by bony demineralization and technique. Within this limitation, no thoracic spine fracture evident. Frontal film shows no abnormal paraspinal line. Patient is status post CABG. IMPRESSION: No acute bony abnormality evident in the thoracic spine. Electronically Signed   By: Misty Stanley M.D.   On: 08/11/2021 05:42   DG Lumbar Spine Complete  Result Date:  08/11/2021 CLINICAL DATA:  Fall.  Pain. EXAM: LUMBAR SPINE - COMPLETE 4+ VIEW COMPARISON:  None. FINDINGS: There is no evidence of lumbar spine fracture. Alignment is normal. Intervertebral disc spaces are maintained. IMPRESSION: Negative. Electronically Signed   By: Misty Stanley M.D.   On: 08/11/2021 05:41   CT HEAD WO CONTRAST (5MM)  Result Date: 08/11/2021 CLINICAL DATA:  Fall.  Neck trauma. EXAM: CT HEAD WITHOUT CONTRAST CT CERVICAL SPINE WITHOUT CONTRAST TECHNIQUE: Multidetector CT imaging of the head and cervical spine was performed following the standard protocol without intravenous contrast. Multiplanar CT image reconstructions of the cervical spine were also generated. COMPARISON:  Cervical spine CT 03/17/2021. head CT 07/08/2016 FINDINGS: CT HEAD FINDINGS Brain: There is no evidence for acute hemorrhage, hydrocephalus, mass lesion, or abnormal extra-axial fluid collection. No definite CT evidence for acute infarction. Vascular: No hyperdense vessel or unexpected calcification. Skull: No evidence for fracture. No worrisome lytic or sclerotic lesion. Sinuses/Orbits: Chronic mucosal disease noted right maxillary sinus. Remaining visualized paranasal sinuses and mastoid air cells are clear. Visualized portions of the globes and intraorbital fat are unremarkable. Other: None. CT CERVICAL SPINE FINDINGS Alignment: Normal. Skull base and vertebrae: No acute fracture. No primary bone lesion or focal pathologic process. Soft tissues and spinal canal: No prevertebral fluid or swelling. No visible canal hematoma. Disc levels:  Loss of disc height noted C7-T1. Upper chest: Unremarkable. Other: None. IMPRESSION: 1. No acute intracranial abnormality. 2. No cervical spine fracture. Electronically Signed   By: Misty Stanley M.D.   On: 08/11/2021 05:24   CT Cervical Spine Wo Contrast  Result Date: 08/11/2021 CLINICAL DATA:  Fall.  Neck trauma. EXAM: CT HEAD WITHOUT CONTRAST CT CERVICAL SPINE WITHOUT CONTRAST  TECHNIQUE: Multidetector CT imaging of the head and cervical spine was performed following the standard protocol without intravenous contrast. Multiplanar CT image reconstructions of the cervical spine were also generated. COMPARISON:  Cervical spine CT 03/17/2021. head CT 07/08/2016 FINDINGS: CT HEAD FINDINGS Brain: There is no evidence for acute hemorrhage, hydrocephalus, mass lesion, or abnormal extra-axial fluid collection. No definite CT evidence for acute infarction. Vascular: No hyperdense vessel or unexpected calcification. Skull: No evidence for fracture. No worrisome lytic or sclerotic lesion. Sinuses/Orbits: Chronic mucosal disease noted right maxillary sinus. Remaining visualized paranasal sinuses and mastoid air cells are clear. Visualized portions of the globes and intraorbital fat are unremarkable. Other: None. CT CERVICAL SPINE FINDINGS Alignment: Normal. Skull base and vertebrae: No acute fracture. No primary bone lesion or focal pathologic process. Soft tissues and spinal canal: No prevertebral fluid or swelling. No visible canal hematoma. Disc levels:  Loss of disc height noted C7-T1. Upper chest: Unremarkable. Other: None. IMPRESSION: 1. No acute intracranial abnormality. 2. No cervical spine fracture. Electronically Signed   By: Misty Stanley M.D.   On: 08/11/2021 05:24   DG Chest Portable 1 View  Result Date: 07/28/2021 CLINICAL DATA:  Shortness of breath. released from the hospital on Saturday and has gained 35 lbs in fluid since EXAM: PORTABLE CHEST 1 VIEW COMPARISON:  Chest x-ray 10/28/2020, chest x-ray 03/17/2021 FINDINGS: The heart size and mediastinal contours are  unchanged. Aortic calcification. No focal consolidation. Increased interstitial markings. No pleural effusion. No pneumothorax. No acute osseous abnormality. IMPRESSION: Mild pulmonary edema. No definite pleural effusions with limited evaluation on this [facility] view. Electronically Signed   By: Iven Finn M.D.   On:  07/28/2021 15:48   DG Knee Complete 4 Views Left  Result Date: 08/20/2021 CLINICAL DATA:  Fall on left side 2 weeks ago.  Left knee pain. EXAM: LEFT KNEE - COMPLETE 4+ VIEW COMPARISON:  Radiograph 11/22/2017 FINDINGS: Left knee arthroplasty in expected alignment. There is no periprosthetic lucency or fracture. There has been patellar resurfacing. Minimal joint effusion. Small quadriceps and patellar tendon enthesophytes again seen. Mild soft tissue edema. IMPRESSION: 1. Intact left knee arthroplasty without complication or acute osseous abnormality. 2. Minimal joint effusion. Mild soft tissue edema. Electronically Signed   By: Keith Rake M.D.   On: 08/20/2021 16:49    Micro Results   No results found for this or any previous visit (from the past 240 hour(s)).  Today   Subjective   [name] today has no new concerns -No fever  Or chills  No Nausea, Vomiting or Diarrhea --Remains medically stable for discharge to SNF Rehab-         Patient has been seen and examined prior to discharge   Objective   Blood pressure 110/67, pulse 77, temperature 98.1 F (36.7 C), temperature source Oral, resp. rate 18, height 5\' 10"  (1.778 m), weight 121 kg, SpO2 100 %.   Intake/Output Summary (Last 24 hours) at 08/25/2021 1418 Last data filed at 08/25/2021 0916 Gross per 24 hour  Intake 480 ml  Output 700 ml  Net -220 ml   Exam Gen:- Awake Alert,  in no apparent distress  HEENT:- [place].AT, No sclera icterus Neck-Supple Neck,No JVD,.  Lungs-  CTAB, no wheezing CV- S1, S2 normal, 3/6 SM, RRR Abd-  +ve B.Sounds, Abd Soft, No tenderness,    Extremity/Skin:- resolved  edema, good pulses  Psych-affect is appropriate, oriented x3 Neuro-Generalized weakness, no new focal deficits, no tremors   Data Review   CBC w Diff:  Lab Results  Component Value Date   WBC 3.5 (L) 08/24/2021   HGB 9.0 (L) 08/24/2021   HGB 12.5 (L) 05/03/2019   HGB 10.1 (L) 12/15/2018   HCT 28.7 (L) 08/24/2021    HCT 30.2 (L) 12/15/2018   PLT 136 (L) 08/24/2021   PLT 72 (L) 05/03/2019   PLT 87 (LL) 12/15/2018   LYMPHOPCT 24 08/22/2021   BANDSPCT 0 03/26/2020   MONOPCT 12 08/22/2021   EOSPCT 2 08/22/2021   BASOPCT 1 08/22/2021    CMP:  Lab Results  Component Value Date   NA 137 08/22/2021   NA 140 12/15/2018   K 4.0 08/22/2021   CL 107 08/22/2021   CO2 25 08/22/2021   BUN 12 08/22/2021   BUN 20 12/15/2018   CREATININE 1.09 08/22/2021   CREATININE 1.40 (H) 01/09/2020   PROT 6.2 (L) 08/22/2021   PROT 7.6 12/25/2018   ALBUMIN 2.5 (L) 08/22/2021   ALBUMIN 3.9 12/15/2018   BILITOT 0.8 08/22/2021   BILITOT 1.0 05/03/2019   ALKPHOS 73 08/22/2021   AST 23 08/22/2021   AST 42 (H) 05/03/2019   ALT 9 08/22/2021   ALT 19 05/03/2019   Total Discharge time is about 33 minutes  Roxan Hockey M.D on 08/25/2021 at 2:18 PM  Go to [email or web address] -  for contact info  [facility] - Office  [phone]

## 2021-08-27 DIAGNOSIS — Z794 Long term (current) use of insulin: Secondary | ICD-10-CM | POA: Diagnosis not present

## 2021-08-27 DIAGNOSIS — G4733 Obstructive sleep apnea (adult) (pediatric): Secondary | ICD-10-CM | POA: Diagnosis not present

## 2021-08-27 DIAGNOSIS — I5032 Chronic diastolic (congestive) heart failure: Secondary | ICD-10-CM | POA: Diagnosis not present

## 2021-08-27 DIAGNOSIS — E785 Hyperlipidemia, unspecified: Secondary | ICD-10-CM | POA: Diagnosis not present

## 2021-08-27 DIAGNOSIS — I2510 Atherosclerotic heart disease of native coronary artery without angina pectoris: Secondary | ICD-10-CM | POA: Diagnosis not present

## 2021-08-27 DIAGNOSIS — E1169 Type 2 diabetes mellitus with other specified complication: Secondary | ICD-10-CM | POA: Diagnosis not present

## 2021-08-27 DIAGNOSIS — I10 Essential (primary) hypertension: Secondary | ICD-10-CM | POA: Diagnosis not present

## 2021-08-27 DIAGNOSIS — L03019 Cellulitis of unspecified finger: Secondary | ICD-10-CM | POA: Diagnosis not present

## 2021-08-28 DIAGNOSIS — D649 Anemia, unspecified: Secondary | ICD-10-CM | POA: Diagnosis not present

## 2021-08-31 DIAGNOSIS — E870 Hyperosmolality and hypernatremia: Secondary | ICD-10-CM | POA: Diagnosis not present

## 2021-08-31 DIAGNOSIS — R5381 Other malaise: Secondary | ICD-10-CM | POA: Diagnosis not present

## 2021-08-31 DIAGNOSIS — I2510 Atherosclerotic heart disease of native coronary artery without angina pectoris: Secondary | ICD-10-CM | POA: Diagnosis not present

## 2021-08-31 DIAGNOSIS — E785 Hyperlipidemia, unspecified: Secondary | ICD-10-CM | POA: Diagnosis not present

## 2021-08-31 DIAGNOSIS — I10 Essential (primary) hypertension: Secondary | ICD-10-CM | POA: Diagnosis not present

## 2021-08-31 DIAGNOSIS — E1365 Other specified diabetes mellitus with hyperglycemia: Secondary | ICD-10-CM | POA: Diagnosis not present

## 2021-08-31 DIAGNOSIS — G4733 Obstructive sleep apnea (adult) (pediatric): Secondary | ICD-10-CM | POA: Diagnosis not present

## 2021-08-31 DIAGNOSIS — E669 Obesity, unspecified: Secondary | ICD-10-CM | POA: Diagnosis not present

## 2021-08-31 DIAGNOSIS — E1169 Type 2 diabetes mellitus with other specified complication: Secondary | ICD-10-CM | POA: Diagnosis not present

## 2021-08-31 DIAGNOSIS — E876 Hypokalemia: Secondary | ICD-10-CM | POA: Diagnosis not present

## 2021-08-31 DIAGNOSIS — M545 Low back pain, unspecified: Secondary | ICD-10-CM | POA: Diagnosis not present

## 2021-08-31 DIAGNOSIS — I5032 Chronic diastolic (congestive) heart failure: Secondary | ICD-10-CM | POA: Diagnosis not present

## 2021-08-31 DIAGNOSIS — Z79899 Other long term (current) drug therapy: Secondary | ICD-10-CM | POA: Diagnosis not present

## 2021-08-31 DIAGNOSIS — E860 Dehydration: Secondary | ICD-10-CM | POA: Diagnosis not present

## 2021-08-31 DIAGNOSIS — M6281 Muscle weakness (generalized): Secondary | ICD-10-CM | POA: Diagnosis not present

## 2021-08-31 DIAGNOSIS — K7460 Unspecified cirrhosis of liver: Secondary | ICD-10-CM | POA: Diagnosis not present

## 2021-08-31 DIAGNOSIS — F419 Anxiety disorder, unspecified: Secondary | ICD-10-CM | POA: Diagnosis not present

## 2021-08-31 DIAGNOSIS — E861 Hypovolemia: Secondary | ICD-10-CM | POA: Diagnosis not present

## 2021-08-31 DIAGNOSIS — W19XXXD Unspecified fall, subsequent encounter: Secondary | ICD-10-CM | POA: Diagnosis not present

## 2021-09-01 DIAGNOSIS — R5381 Other malaise: Secondary | ICD-10-CM | POA: Diagnosis not present

## 2021-09-01 DIAGNOSIS — E860 Dehydration: Secondary | ICD-10-CM | POA: Diagnosis not present

## 2021-09-03 DIAGNOSIS — E785 Hyperlipidemia, unspecified: Secondary | ICD-10-CM | POA: Diagnosis not present

## 2021-09-03 DIAGNOSIS — Z794 Long term (current) use of insulin: Secondary | ICD-10-CM | POA: Diagnosis not present

## 2021-09-03 DIAGNOSIS — Z79899 Other long term (current) drug therapy: Secondary | ICD-10-CM | POA: Diagnosis not present

## 2021-09-03 DIAGNOSIS — I5032 Chronic diastolic (congestive) heart failure: Secondary | ICD-10-CM | POA: Diagnosis not present

## 2021-09-03 DIAGNOSIS — I10 Essential (primary) hypertension: Secondary | ICD-10-CM | POA: Diagnosis not present

## 2021-09-03 DIAGNOSIS — G4733 Obstructive sleep apnea (adult) (pediatric): Secondary | ICD-10-CM | POA: Diagnosis not present

## 2021-09-03 DIAGNOSIS — E1169 Type 2 diabetes mellitus with other specified complication: Secondary | ICD-10-CM | POA: Diagnosis not present

## 2021-09-03 DIAGNOSIS — F419 Anxiety disorder, unspecified: Secondary | ICD-10-CM | POA: Diagnosis not present

## 2021-09-04 DIAGNOSIS — E119 Type 2 diabetes mellitus without complications: Secondary | ICD-10-CM | POA: Diagnosis not present

## 2021-09-04 DIAGNOSIS — N184 Chronic kidney disease, stage 4 (severe): Secondary | ICD-10-CM | POA: Diagnosis not present

## 2021-09-04 DIAGNOSIS — D649 Anemia, unspecified: Secondary | ICD-10-CM | POA: Diagnosis not present

## 2021-09-04 DIAGNOSIS — R262 Difficulty in walking, not elsewhere classified: Secondary | ICD-10-CM | POA: Diagnosis not present

## 2021-09-11 ENCOUNTER — Encounter (HOSPITAL_COMMUNITY): Payer: Self-pay | Admitting: Radiology

## 2021-09-11 ENCOUNTER — Ambulatory Visit: Payer: Federal, State, Local not specified - PPO | Admitting: Student

## 2021-09-11 NOTE — Progress Notes (Deleted)
Cardiology Office Note    Date:  09/11/2021   ID:  Demondre, Aguas 1961/02/04, MRN [id number]  PCP:  [name], MD  Cardiologist: Rozann Lesches, MD    No chief complaint on file.   History of Present Illness:    [name] is a 61 y.o. male with past medical history of CAD (s/p CABG in 2016), HFpEF, aortic stenosis, hepatic cirrhosis, adrenal insufficiency, HTN, HLD, Type 2 DM, Stage 3 CKD, OSA, thrombocytopenia and polyclonal gammopathy who presents to the office today for 24-month follow-up.  He was examined by Dr. Domenic Polite in 06/2021 and reported a gradual weight gain of over 10 pounds within the past few months.  He denied any recent anginal symptoms and recent PET myocardial perfusion study earlier that year had shown only mild peri-infarct ischemia and medical management was recommended.  He was encouraged to take extra Torsemide for the next 2 to 3 days and resume his baseline dose.  By review of notes, he was admitted to [facility] in 07/2021 for GI bleeding and symptomatic anemia and was found to have grade 2 esophageal varices by EGD performed during admission. It was recommended that he be on twice daily PPI therapy for [redacted] weeks along with Carafate.  He was again admitted from 8/30 - 08/25/2021 for hypotension in the setting of electrolyte imbalances and dehydration.  Torsemide was reduced to 20 mg daily at the time of discharge along with Spironolactone being reduced to 12.5 mg daily.  He was continued on Nadolol 20 mg daily and midodrine 10 mg 3 times daily  Past Medical History:  Diagnosis Date   Anemia    Arthritis    CAD (coronary artery disease)    Multivessel disease status post CABG 08/2015   Cirrhosis (HCC)    CKD (chronic kidney disease) stage 3, GFR 30-59 ml/min (HCC)    Diastolic CHF (HCC)    Essential hypertension    Hyperlipidemia    Iron deficiency anemia 07/15/2021   OSA on CPAP    Polyclonal gammopathy    Thrombocytopenia ([place]) 2016    Type 2 diabetes mellitus ([place])     Past Surgical History:  Procedure Laterality Date   APPENDECTOMY     Biceps tendon surgery Right    BIOPSY  11/22/2019   Procedure: BIOPSY;  Surgeon: Daneil Dolin, MD;  [facility];  Service: Endoscopy;;   CARDIAC CATHETERIZATION N/A 08/25/2015   Procedure: Left Heart Cath and Coronary Angiography;  Surgeon: Belva Crome, MD;  Location: [facility];  Service: Cardiovascular;  Laterality: N/A;   COLONOSCOPY WITH PROPOFOL N/A 11/22/2019   Procedure: COLONOSCOPY WITH PROPOFOL;  Surgeon: Daneil Dolin, MD; Four 4-5 mm polyps, findings suggestive of portal colopathy, congested appearing colonic mucosa diffusely, rectal varices, and adequate right colon prep.  Pathology with tubular adenomas and hyperplastic polyp.  Right colon biopsy with focal active colitis.  Recommendations to repeat colonoscopy in 3 months due to poor prep.   COLONOSCOPY WITH PROPOFOL N/A 03/17/2020   Procedure: COLONOSCOPY WITH PROPOFOL;  Surgeon: Daneil Dolin, MD;  [facility];  Service: Endoscopy;  Laterality: N/A;  8:45am - pt does not need covid test, was + 2/4 <90 days   CORONARY ARTERY BYPASS GRAFT N/A 08/29/2015   Procedure: CORONARY ARTERY BYPASS GRAFTING (CABG);  Surgeon: [name], MD;  Location: [place];  Service: Open Heart Surgery;  Laterality: N/A;   ESOPHAGEAL BANDING N/A 07/23/2021  Procedure: ESOPHAGEAL BANDING;  Surgeon: Eloise Harman, DO;  [facility];  Service: Endoscopy;  Laterality: N/A;   ESOPHAGOGASTRODUODENOSCOPY (EGD) WITH PROPOFOL N/A 11/22/2019   Procedure: ESOPHAGOGASTRODUODENOSCOPY (EGD) WITH PROPOFOL;  Surgeon: Daneil Dolin, MD; 4 columns of grade 2-3 esophageal varices, portal gastropathy, gastric polyp/abnormal gastric mucosa s/p biopsy.  Pathology with hyperplastic polyp, mild chronic gastritis, negative H. pylori.   ESOPHAGOGASTRODUODENOSCOPY (EGD) WITH PROPOFOL N/A 07/23/2021   Procedure:  ESOPHAGOGASTRODUODENOSCOPY (EGD) WITH PROPOFOL;  Surgeon: Eloise Harman, DO;  [facility];  Service: Endoscopy;  Laterality: N/A;   KNEE ARTHROSCOPY Left    TEE WITHOUT CARDIOVERSION N/A 08/29/2015   Procedure: TRANSESOPHAGEAL ECHOCARDIOGRAM (TEE);  Surgeon: [name], MD;  Location: [place];  Service: Open Heart Surgery;  Laterality: N/A;   TOTAL KNEE ARTHROPLASTY Left 03/09/2017   Procedure: TOTAL KNEE ARTHROPLASTY;  Surgeon: Carole Civil, MD;  Location: [facility];  Service: Orthopedics;  Laterality: Left;    Current Medications: Outpatient Medications Prior to Visit  Medication Sig Dispense Refill   acetaminophen (TYLENOL) 325 MG tablet Take 650 mg by mouth every 6 (six) hours as needed for mild pain.     albuterol (VENTOLIN HFA) 108 (90 Base) MCG/ACT inhaler Inhale 2 puffs into the lungs every 6 (six) hours as needed for wheezing or shortness of breath. 18 g 0   ALPRAZolam (XANAX) 0.5 MG tablet Take 1 tablet (0.5 mg total) by mouth daily as needed. 10 tablet 0   aspirin EC 81 MG tablet Take 1 tablet (81 mg total) by mouth daily with breakfast. 30 tablet 11   Cholecalciferol (DIALYVITE VITAMIN D 5000) 125 MCG (5000 UT) capsule Take 5,000 Units by mouth in the morning.      ferrous sulfate 325 (65 FE) MG tablet Take 1 tablet (325 mg total) by mouth daily with breakfast. 30 tablet 3   insulin aspart (NOVOLOG) 100 UNIT/ML injection Inject 5 Units into the skin 3 (three) times daily before meals. Special Instructions: If accu-check is greater than 150. Hold for accu-check 150 or below. With Meals 10 mL 0   insulin degludec (TRESIBA FLEXTOUCH) 200 UNIT/ML FlexTouch Pen Inject 40 Units into the skin in the morning. Give 30 units Subcutaneous  at Bedtime 8:00 pm 15 mL 0   lactulose (CHRONULAC) 10 GM/15ML solution Take 30 mLs (20 g total) by mouth daily. 900 mL 0   lidocaine (XYLOCAINE) 2 % solution 5 mLs as needed. (Patient not taking: Reported on 07/28/2021)     loratadine  (CLARITIN) 10 MG tablet Take 10 mg by mouth in the morning.      magnesium oxide (MAG-OX) 400 (240 Mg) MG tablet Take 1 tablet (400 mg total) by mouth daily. 30 tablet 2   methocarbamol (ROBAXIN) 500 MG tablet Take 1 tablet (500 mg total) by mouth 3 (three) times daily. 90 tablet 0   midodrine (PROAMATINE) 10 MG tablet Take 1 tablet (10 mg total) by mouth 3 (three) times daily with meals. 90 tablet 5   nadolol (CORGARD) 20 MG tablet Take by mouth daily.     ondansetron (ZOFRAN) 4 MG tablet Take 1 tablet (4 mg total) by mouth every 6 (six) hours as needed for nausea. 20 tablet 0   OZEMPIC, 1 MG/DOSE, 4 MG/3ML SOPN Inject 1 mg into the skin once a week. 3 mL 0   pantoprazole (PROTONIX) 40 MG tablet Take 1 tablet (40 mg total) by mouth daily. 30 tablet 5   potassium chloride (  KLOR-CON) 10 MEQ tablet Take 1 tablet (10 mEq total) by mouth daily. Take While taking Torsemide 30 tablet 2   pravastatin (PRAVACHOL) 20 MG tablet TAKE 1 TABLET(20 MG) BY MOUTH AT BEDTIME 30 tablet 0   rifaximin (XIFAXAN) 550 MG TABS tablet Take 1 tablet (550 mg total) by mouth 2 (two) times daily. 60 tablet 5   spironolactone (ALDACTONE) 25 MG tablet Take 0.5 tablets (12.5 mg total) by mouth daily. 30 tablet 3   torsemide (DEMADEX) 20 MG tablet Take 1 tablet (20 mg total) by mouth daily. 30 tablet 1   venlafaxine XR (EFFEXOR-XR) 75 MG 24 hr capsule Take 1 capsule (75 mg total) by mouth daily with breakfast. 30 capsule 3   No facility-administered medications prior to visit.     Allergies:   Fluticasone   Social History   Socioeconomic History   Marital status: Married    Spouse name: Not on file   Number of children: 0   Years of education: college   Highest education level: Not on file  Occupational History   Occupation: Maintenance tech    Employer: [employer]  Tobacco Use   Smoking status: Never   Smokeless tobacco: Never  Vaping Use   Vaping Use: Never used  Substance and Sexual Activity   Alcohol  use: Not Currently   Drug use: No   Sexual activity: Not Currently  Other Topics Concern   Not on file  Social History Narrative   Not on file   Social Determinants of Health   Financial Resource Strain: Not on file  Food Insecurity: Not on file  Transportation Needs: Not on file  Physical Activity: Not on file  Stress: Not on file  Social Connections: Not on file     Family History:  The patient's ***family history includes ALS in his mother; Aneurysm in his maternal grandmother; Arthritis in an other family member; CAD in his brother and father; Cancer in his maternal grandfather and another family member; Diabetes in an other family member; Diabetes Mellitus II in his brother, brother, father, maternal grandmother, and sister; Hodgkin's lymphoma in his father; Liver cancer in his father.   Review of Systems:    Please see the history of present illness.     All other systems reviewed and are otherwise negative except as noted above.   Physical Exam:    VS:  There were no vitals taken for this visit.   General: Well developed, well nourished,male appearing in no acute distress. Head: Normocephalic, atraumatic. Neck: No carotid bruits. JVD not elevated.  Lungs: Respirations regular and unlabored, without wheezes or rales.  Heart: ***Regular rate and rhythm. No S3 or S4.  No murmur, no rubs, or gallops appreciated. Abdomen: Appears non-distended. No obvious abdominal masses. Msk:  Strength and tone appear normal for age. No obvious joint deformities or effusions. Extremities: No clubbing or cyanosis. No edema.  Distal pedal pulses are 2+ bilaterally. Neuro: Alert and oriented X 3. Moves all extremities spontaneously. No focal deficits noted. Psych:  Responds to questions appropriately with a normal affect. Skin: No rashes or lesions noted  Wt Readings from Last 3 Encounters:  08/25/21 266 lb 12.1 oz (121 kg)  07/28/21 290 lb (131.5 kg)  07/23/21 276 lb 7.3 oz (125.4 kg)         Studies/Labs Reviewed:   EKG:  EKG is*** ordered today.  The ekg ordered today demonstrates ***  Recent Labs: 02/02/2021: TSH 1.901 07/28/2021: B Natriuretic Peptide 351.0 08/22/2021:  ALT 9; BUN 12; Creatinine, Ser 1.09; Magnesium 1.9; Potassium 4.0; Sodium 137 08/24/2021: Hemoglobin 9.0; Platelets 136   Lipid Panel    Component Value Date/Time   CHOL 120 05/25/2019 1001   TRIG 136 05/25/2019 1001   HDL 38 (L) 05/25/2019 1001   CHOLHDL 3.2 05/25/2019 1001   VLDL 27 05/25/2019 1001   LDLCALC 55 05/25/2019 1001    Additional studies/ records that were reviewed today include:   Cardiac Catheterization: 08/2015 Mid RCA to Dist RCA lesion, 100% stenosed. 1st Mrg lesion, 90% stenosed. Ramus lesion, 70% stenosed. Ost Cx to Dist Cx lesion, 60% stenosed. Ost 1st Diag lesion, 90% stenosed. Ost 2nd Diag to 2nd Diag lesion, 70% stenosed. Mid LAD lesion, 90% stenosed. Post Atrio lesion, 90% stenosed. Dist RCA lesion, 100% stenosed.   Severe native vessel coronary artery disease with total occlusion of the distal RCA (likely culprit for acute presentation), high-grade obstruction in the proximal to mid LAD, high-grade obstruction in the first obtuse marginal, significant stenosis in the ramus intermedius, first diagonal, and second diagonal. LVEF 50% with marked elevation in LVEDP, consistent with acute diastolic left heart failure. Distal right coronary is collateralized from the LAD and circumflex. The right coronary is dominant.     RECOMMENDATIONS:   TCTS consultation for consideration of surgical revascularization. Continue aspirin. No Plavix. In TIMI IV heparin.  Echocardiogram: 05/2020 IMPRESSIONS     1. Left ventricular ejection fraction, by estimation, is 60 to 65%. The  left ventricle has normal function. The left ventricle has no regional  wall motion abnormalities. The left ventricular internal cavity size was  mildly dilated. There is mild left  ventricular  hypertrophy. Left ventricular diastolic parameters are  indeterminate.   2. Right ventricular systolic function is normal. The right ventricular  size is normal. There is normal pulmonary artery systolic pressure.   3. The mitral valve is abnormal. Mild mitral valve regurgitation.   4. AV is thickened, calcifieid Difficult to see well. Peak and mean  gradients through the valve are 31 and 18 mm Hg respectively consistent  with mild AS. Compared to echo report from 2019, gradients are increased.  . The aortic valve is abnormal. Aortic  valve regurgitation is not visualized. Mild aortic valve stenosis.   5. The inferior vena cava is dilated in size with <50% respiratory  variability, suggesting right atrial pressure of 15 mmHg.   Assessment:    No diagnosis found.   Plan:   In order of problems listed above:  ***    Shared Decision Making/Informed Consent:   {Are you ordering a CV Procedure (e.g. stress test, cath, DCCV, TEE, etc)?   Press F2        :[id number]}    Medication Adjustments/Labs and Tests Ordered: Current medicines are reviewed at length with the patient today.  Concerns regarding medicines are outlined above.  Medication changes, Labs and Tests ordered today are listed in the Patient Instructions below. There are no Patient Instructions on file for this visit.   Signed, Erma Heritage, PA-C  09/11/2021 12:03 PM    [place] [address] [place], [place] [address] Phone: [phone] Fax: [phone]

## 2021-09-12 DIAGNOSIS — G4733 Obstructive sleep apnea (adult) (pediatric): Secondary | ICD-10-CM | POA: Diagnosis not present

## 2021-09-16 ENCOUNTER — Other Ambulatory Visit: Payer: Self-pay | Admitting: Student

## 2021-09-22 ENCOUNTER — Telehealth (HOSPITAL_COMMUNITY): Payer: Self-pay | Admitting: *Deleted

## 2021-09-22 NOTE — Telephone Encounter (Signed)
Received TC from wife with concerns related to his labile hemoglobins in relation to liver disease and esophageal varices.  Would like for his Hgb to be checked more frequently to evaluate for bleeding.  Education completed in relation to his GI disease and that component being managed by that group.  Offered an earlier appointment to discuss hematology realm with Tarri Abernethy.  Verbalized understanding and accepted appointment for 10/19 for labs and follow up.

## 2021-09-23 ENCOUNTER — Inpatient Hospital Stay (HOSPITAL_COMMUNITY): Payer: Federal, State, Local not specified - PPO | Attending: Physician Assistant

## 2021-09-23 DIAGNOSIS — Z79899 Other long term (current) drug therapy: Secondary | ICD-10-CM | POA: Insufficient documentation

## 2021-09-23 DIAGNOSIS — D696 Thrombocytopenia, unspecified: Secondary | ICD-10-CM | POA: Insufficient documentation

## 2021-09-23 DIAGNOSIS — D631 Anemia in chronic kidney disease: Secondary | ICD-10-CM | POA: Insufficient documentation

## 2021-09-23 DIAGNOSIS — N189 Chronic kidney disease, unspecified: Secondary | ICD-10-CM | POA: Insufficient documentation

## 2021-09-23 DIAGNOSIS — R161 Splenomegaly, not elsewhere classified: Secondary | ICD-10-CM | POA: Diagnosis not present

## 2021-09-23 DIAGNOSIS — I129 Hypertensive chronic kidney disease with stage 1 through stage 4 chronic kidney disease, or unspecified chronic kidney disease: Secondary | ICD-10-CM | POA: Diagnosis not present

## 2021-09-23 DIAGNOSIS — K7460 Unspecified cirrhosis of liver: Secondary | ICD-10-CM | POA: Insufficient documentation

## 2021-09-23 DIAGNOSIS — D509 Iron deficiency anemia, unspecified: Secondary | ICD-10-CM | POA: Insufficient documentation

## 2021-09-23 DIAGNOSIS — I8500 Esophageal varices without bleeding: Secondary | ICD-10-CM | POA: Insufficient documentation

## 2021-09-23 DIAGNOSIS — E2740 Unspecified adrenocortical insufficiency: Secondary | ICD-10-CM | POA: Diagnosis not present

## 2021-09-23 LAB — IRON AND TIBC
Iron: 137 ug/dL (ref 45–182)
Saturation Ratios: 34 % (ref 17.9–39.5)
TIBC: 405 ug/dL (ref 250–450)
UIBC: 268 ug/dL

## 2021-09-23 LAB — CBC WITH DIFFERENTIAL/PLATELET
Abs Immature Granulocytes: 0.02 10*3/uL (ref 0.00–0.07)
Basophils Absolute: 0 10*3/uL (ref 0.0–0.1)
Basophils Relative: 0 %
Eosinophils Absolute: 0.1 10*3/uL (ref 0.0–0.5)
Eosinophils Relative: 1 %
HCT: 33.6 % — ABNORMAL LOW (ref 39.0–52.0)
Hemoglobin: 11 g/dL — ABNORMAL LOW (ref 13.0–17.0)
Immature Granulocytes: 0 %
Lymphocytes Relative: 13 %
Lymphs Abs: 0.7 10*3/uL (ref 0.7–4.0)
MCH: 29.9 pg (ref 26.0–34.0)
MCHC: 32.7 g/dL (ref 30.0–36.0)
MCV: 91.3 fL (ref 80.0–100.0)
Monocytes Absolute: 0.5 10*3/uL (ref 0.1–1.0)
Monocytes Relative: 9 %
Neutro Abs: 4.3 10*3/uL (ref 1.7–7.7)
Neutrophils Relative %: 77 %
Platelets: 102 10*3/uL — ABNORMAL LOW (ref 150–400)
RBC: 3.68 MIL/uL — ABNORMAL LOW (ref 4.22–5.81)
RDW: 15 % (ref 11.5–15.5)
WBC: 5.6 10*3/uL (ref 4.0–10.5)
nRBC: 0 % (ref 0.0–0.2)

## 2021-09-23 LAB — FERRITIN: Ferritin: 33 ng/mL (ref 24–336)

## 2021-09-24 DIAGNOSIS — R29898 Other symptoms and signs involving the musculoskeletal system: Secondary | ICD-10-CM | POA: Diagnosis not present

## 2021-09-24 DIAGNOSIS — E559 Vitamin D deficiency, unspecified: Secondary | ICD-10-CM | POA: Diagnosis not present

## 2021-09-24 DIAGNOSIS — E7800 Pure hypercholesterolemia, unspecified: Secondary | ICD-10-CM | POA: Diagnosis not present

## 2021-09-24 DIAGNOSIS — I129 Hypertensive chronic kidney disease with stage 1 through stage 4 chronic kidney disease, or unspecified chronic kidney disease: Secondary | ICD-10-CM | POA: Diagnosis not present

## 2021-09-24 DIAGNOSIS — E1165 Type 2 diabetes mellitus with hyperglycemia: Secondary | ICD-10-CM | POA: Diagnosis not present

## 2021-09-24 DIAGNOSIS — Z7689 Persons encountering health services in other specified circumstances: Secondary | ICD-10-CM | POA: Diagnosis not present

## 2021-09-24 DIAGNOSIS — M792 Neuralgia and neuritis, unspecified: Secondary | ICD-10-CM | POA: Diagnosis not present

## 2021-09-24 DIAGNOSIS — I2510 Atherosclerotic heart disease of native coronary artery without angina pectoris: Secondary | ICD-10-CM | POA: Diagnosis not present

## 2021-09-24 DIAGNOSIS — K766 Portal hypertension: Secondary | ICD-10-CM | POA: Diagnosis not present

## 2021-09-24 DIAGNOSIS — Z8639 Personal history of other endocrine, nutritional and metabolic disease: Secondary | ICD-10-CM | POA: Diagnosis not present

## 2021-09-24 DIAGNOSIS — I214 Non-ST elevation (NSTEMI) myocardial infarction: Secondary | ICD-10-CM | POA: Diagnosis not present

## 2021-09-24 DIAGNOSIS — Z713 Dietary counseling and surveillance: Secondary | ICD-10-CM | POA: Diagnosis not present

## 2021-09-24 DIAGNOSIS — K219 Gastro-esophageal reflux disease without esophagitis: Secondary | ICD-10-CM | POA: Diagnosis not present

## 2021-09-24 DIAGNOSIS — Z862 Personal history of diseases of the blood and blood-forming organs and certain disorders involving the immune mechanism: Secondary | ICD-10-CM | POA: Diagnosis not present

## 2021-09-24 DIAGNOSIS — Z79899 Other long term (current) drug therapy: Secondary | ICD-10-CM | POA: Diagnosis not present

## 2021-09-24 DIAGNOSIS — N189 Chronic kidney disease, unspecified: Secondary | ICD-10-CM | POA: Diagnosis not present

## 2021-09-24 DIAGNOSIS — D649 Anemia, unspecified: Secondary | ICD-10-CM | POA: Diagnosis not present

## 2021-09-24 DIAGNOSIS — M6281 Muscle weakness (generalized): Secondary | ICD-10-CM | POA: Diagnosis not present

## 2021-09-24 DIAGNOSIS — G4733 Obstructive sleep apnea (adult) (pediatric): Secondary | ICD-10-CM | POA: Diagnosis not present

## 2021-09-24 DIAGNOSIS — Z951 Presence of aortocoronary bypass graft: Secondary | ICD-10-CM | POA: Diagnosis not present

## 2021-09-24 DIAGNOSIS — D631 Anemia in chronic kidney disease: Secondary | ICD-10-CM | POA: Diagnosis not present

## 2021-09-28 DIAGNOSIS — E1165 Type 2 diabetes mellitus with hyperglycemia: Secondary | ICD-10-CM | POA: Diagnosis not present

## 2021-09-28 DIAGNOSIS — Z79899 Other long term (current) drug therapy: Secondary | ICD-10-CM | POA: Diagnosis not present

## 2021-09-28 DIAGNOSIS — Z951 Presence of aortocoronary bypass graft: Secondary | ICD-10-CM | POA: Diagnosis not present

## 2021-09-28 DIAGNOSIS — E785 Hyperlipidemia, unspecified: Secondary | ICD-10-CM | POA: Diagnosis not present

## 2021-09-28 DIAGNOSIS — E119 Type 2 diabetes mellitus without complications: Secondary | ICD-10-CM | POA: Diagnosis not present

## 2021-09-28 DIAGNOSIS — K7581 Nonalcoholic steatohepatitis (NASH): Secondary | ICD-10-CM | POA: Diagnosis not present

## 2021-09-28 DIAGNOSIS — E669 Obesity, unspecified: Secondary | ICD-10-CM | POA: Diagnosis not present

## 2021-09-28 DIAGNOSIS — Z23 Encounter for immunization: Secondary | ICD-10-CM | POA: Diagnosis not present

## 2021-09-28 DIAGNOSIS — I10 Essential (primary) hypertension: Secondary | ICD-10-CM | POA: Diagnosis not present

## 2021-09-28 DIAGNOSIS — Z794 Long term (current) use of insulin: Secondary | ICD-10-CM | POA: Diagnosis not present

## 2021-09-28 DIAGNOSIS — K7460 Unspecified cirrhosis of liver: Secondary | ICD-10-CM | POA: Diagnosis not present

## 2021-09-28 DIAGNOSIS — G4733 Obstructive sleep apnea (adult) (pediatric): Secondary | ICD-10-CM | POA: Diagnosis not present

## 2021-09-28 DIAGNOSIS — Z6834 Body mass index (BMI) 34.0-34.9, adult: Secondary | ICD-10-CM | POA: Diagnosis not present

## 2021-09-30 ENCOUNTER — Other Ambulatory Visit: Payer: Self-pay

## 2021-09-30 ENCOUNTER — Inpatient Hospital Stay (HOSPITAL_BASED_OUTPATIENT_CLINIC_OR_DEPARTMENT_OTHER): Payer: Federal, State, Local not specified - PPO | Admitting: Physician Assistant

## 2021-09-30 VITALS — BP 143/67 | HR 72 | Temp 97.0°F | Resp 20 | Wt 239.4 lb

## 2021-09-30 DIAGNOSIS — D631 Anemia in chronic kidney disease: Secondary | ICD-10-CM | POA: Diagnosis not present

## 2021-09-30 DIAGNOSIS — D509 Iron deficiency anemia, unspecified: Secondary | ICD-10-CM

## 2021-09-30 DIAGNOSIS — I8500 Esophageal varices without bleeding: Secondary | ICD-10-CM | POA: Diagnosis not present

## 2021-09-30 DIAGNOSIS — I129 Hypertensive chronic kidney disease with stage 1 through stage 4 chronic kidney disease, or unspecified chronic kidney disease: Secondary | ICD-10-CM | POA: Diagnosis not present

## 2021-09-30 DIAGNOSIS — E2740 Unspecified adrenocortical insufficiency: Secondary | ICD-10-CM | POA: Diagnosis not present

## 2021-09-30 DIAGNOSIS — Z79899 Other long term (current) drug therapy: Secondary | ICD-10-CM | POA: Diagnosis not present

## 2021-09-30 DIAGNOSIS — D696 Thrombocytopenia, unspecified: Secondary | ICD-10-CM | POA: Diagnosis not present

## 2021-09-30 DIAGNOSIS — K7460 Unspecified cirrhosis of liver: Secondary | ICD-10-CM | POA: Diagnosis not present

## 2021-09-30 DIAGNOSIS — R161 Splenomegaly, not elsewhere classified: Secondary | ICD-10-CM | POA: Diagnosis not present

## 2021-09-30 DIAGNOSIS — N189 Chronic kidney disease, unspecified: Secondary | ICD-10-CM | POA: Diagnosis not present

## 2021-09-30 NOTE — Progress Notes (Signed)
Cardiology Office Note    Date:  10/01/2021   ID:  Camron, Monday Apr 30, 1961, MRN [id number]  PCP:  [name], MD  Cardiologist: Rozann Lesches, MD    Chief Complaint  Patient presents with   Follow-up    2 month visit    History of Present Illness:    [name] is a 60 y.o. male with past medical history of CAD (s/p CABG in 2016), HFpEF, aortic stenosis, hepatic cirrhosis, adrenal insufficiency, HTN, HLD, Type 2 DM, Stage 3 CKD, OSA, anemia, thrombocytopenia and polyclonal gammopathy who presents to the office today for 69-month follow-up.   He was examined by Dr. Domenic Polite in 06/2021 and reported a gradual weight gain of over 10 pounds within the past few months. He denied any recent anginal symptoms and recent PET myocardial perfusion study earlier that year had shown only mild peri-infarct ischemia and medical management was recommended. He was encouraged to take extra Torsemide for the next 2 to 3 days and resume his baseline dose.   By review of notes, he was admitted to [facility] in 07/2021 for GI bleeding and symptomatic anemia and was found to have grade 2 esophageal varices by EGD performed during admission. It was recommended that he be on twice daily PPI therapy for [redacted] weeks along with Carafate. He was again admitted from 8/30 - 08/25/2021 for hypotension in the setting of electrolyte imbalances and dehydration. Torsemide was reduced to 20 mg daily at the time of discharge along with Spironolactone being reduced to 12.5 mg daily.  He was continued on Nadolol 20 mg daily and Midodrine 10 mg 3 times daily.  In talking with the patient today, he reports he has returned home from SNF and is scheduled to start outpatient PT next week. He has overall been doing well since returning home and denies any recurrent dizziness or presyncope. His blood pressure has been well controlled with no recent hypotension. HCTZ and Benicar remain on his medication list but he  does not believe he is taking these.  He denies any recent chest pain, palpitations, orthopnea, PND or pitting edema. He does wear compression stockings on a daily basis. He has lost almost 30 pounds within the past 3 months which is intentional with dietary changes as his goal weight is around 220 - 225 lbs prior to undergoing liver transplant.    Past Medical History:  Diagnosis Date   Anemia    Arthritis    CAD (coronary artery disease)    Multivessel disease status post CABG 08/2015   Cirrhosis (HCC)    CKD (chronic kidney disease) stage 3, GFR 30-59 ml/min (HCC)    Diastolic CHF (HCC)    Essential hypertension    Hyperlipidemia    Iron deficiency anemia 07/15/2021   OSA on CPAP    Polyclonal gammopathy    Thrombocytopenia (HCC) 2016   Type 2 diabetes mellitus ([place])     Past Surgical History:  Procedure Laterality Date   APPENDECTOMY     Biceps tendon surgery Right    BIOPSY  11/22/2019   Procedure: BIOPSY;  Surgeon: Daneil Dolin, MD;  [facility];  Service: Endoscopy;;   CARDIAC CATHETERIZATION N/A 08/25/2015   Procedure: Left Heart Cath and Coronary Angiography;  Surgeon: Belva Crome, MD;  Location: [facility];  Service: Cardiovascular;  Laterality: N/A;   COLONOSCOPY WITH PROPOFOL N/A 11/22/2019   Procedure: COLONOSCOPY WITH PROPOFOL;  Surgeon: Daneil Dolin, MD; Four  4-5 mm polyps, findings suggestive of portal colopathy, congested appearing colonic mucosa diffusely, rectal varices, and adequate right colon prep.  Pathology with tubular adenomas and hyperplastic polyp.  Right colon biopsy with focal active colitis.  Recommendations to repeat colonoscopy in 3 months due to poor prep.   COLONOSCOPY WITH PROPOFOL N/A 03/17/2020   Procedure: COLONOSCOPY WITH PROPOFOL;  Surgeon: Daneil Dolin, MD;  [facility];  Service: Endoscopy;  Laterality: N/A;  8:45am - pt does not need covid test, was + 2/4 <90 days   CORONARY ARTERY BYPASS GRAFT N/A  08/29/2015   Procedure: CORONARY ARTERY BYPASS GRAFTING (CABG);  Surgeon: [name], MD;  Location: [place];  Service: Open Heart Surgery;  Laterality: N/A;   ESOPHAGEAL BANDING N/A 07/23/2021   Procedure: ESOPHAGEAL BANDING;  Surgeon: Eloise Harman, DO;  [facility];  Service: Endoscopy;  Laterality: N/A;   ESOPHAGOGASTRODUODENOSCOPY (EGD) WITH PROPOFOL N/A 11/22/2019   Procedure: ESOPHAGOGASTRODUODENOSCOPY (EGD) WITH PROPOFOL;  Surgeon: Daneil Dolin, MD; 4 columns of grade 2-3 esophageal varices, portal gastropathy, gastric polyp/abnormal gastric mucosa s/p biopsy.  Pathology with hyperplastic polyp, mild chronic gastritis, negative H. pylori.   ESOPHAGOGASTRODUODENOSCOPY (EGD) WITH PROPOFOL N/A 07/23/2021   Procedure: ESOPHAGOGASTRODUODENOSCOPY (EGD) WITH PROPOFOL;  Surgeon: Eloise Harman, DO;  [facility];  Service: Endoscopy;  Laterality: N/A;   KNEE ARTHROSCOPY Left    TEE WITHOUT CARDIOVERSION N/A 08/29/2015   Procedure: TRANSESOPHAGEAL ECHOCARDIOGRAM (TEE);  Surgeon: [name], MD;  Location: [place];  Service: Open Heart Surgery;  Laterality: N/A;   TOTAL KNEE ARTHROPLASTY Left 03/09/2017   Procedure: TOTAL KNEE ARTHROPLASTY;  Surgeon: Carole Civil, MD;  Location: [facility];  Service: Orthopedics;  Laterality: Left;    Current Medications: Outpatient Medications Prior to Visit  Medication Sig Dispense Refill   acetaminophen (TYLENOL) 325 MG tablet Take 650 mg by mouth every 6 (six) hours as needed for mild pain.     albuterol (VENTOLIN HFA) 108 (90 Base) MCG/ACT inhaler Inhale 2 puffs into the lungs every 6 (six) hours as needed for wheezing or shortness of breath. 18 g 0   ALPRAZolam (XANAX) 0.5 MG tablet Take 1 tablet (0.5 mg total) by mouth daily as needed. 10 tablet 0   aspirin EC 81 MG tablet Take 1 tablet (81 mg total) by mouth daily with breakfast. 30 tablet 11   Cholecalciferol (DIALYVITE VITAMIN D 5000) 125 MCG (5000 UT)  capsule Take 5,000 Units by mouth in the morning.      ferrous sulfate 325 (65 FE) MG tablet Take 1 tablet (325 mg total) by mouth daily with breakfast. 30 tablet 3   insulin aspart (NOVOLOG) 100 UNIT/ML injection Inject 5 Units into the skin 3 (three) times daily before meals. Special Instructions: If accu-check is greater than 150. Hold for accu-check 150 or below. With Meals 10 mL 0   insulin degludec (TRESIBA FLEXTOUCH) 200 UNIT/ML FlexTouch Pen Inject 40 Units into the skin in the morning. Give 30 units Subcutaneous  at Bedtime 8:00 pm 15 mL 0   lactulose (CHRONULAC) 10 GM/15ML solution Take 30 mLs (20 g total) by mouth daily. 900 mL 0   lactulose (CHRONULAC) 10 GM/15ML solution Take by mouth.     lidocaine (XYLOCAINE) 2 % solution 5 mLs as needed.     loratadine (CLARITIN) 10 MG tablet Take 10 mg by mouth in the morning.      magnesium oxide (MAG-OX) 400 (240 Mg) MG tablet  Take 1 tablet (400 mg total) by mouth daily. 30 tablet 2   nadolol (CORGARD) 20 MG tablet Take by mouth daily.     ondansetron (ZOFRAN) 4 MG tablet Take 1 tablet (4 mg total) by mouth every 6 (six) hours as needed for nausea. 20 tablet 0   OZEMPIC, 1 MG/DOSE, 4 MG/3ML SOPN Inject 1 mg into the skin once a week. 3 mL 0   pantoprazole (PROTONIX) 40 MG tablet Take 1 tablet (40 mg total) by mouth daily. 30 tablet 5   potassium chloride (KLOR-CON) 10 MEQ tablet Take 1 tablet (10 mEq total) by mouth daily. Take While taking Torsemide 30 tablet 2   pravastatin (PRAVACHOL) 20 MG tablet TAKE 1 TABLET(20 MG) BY MOUTH AT BEDTIME 30 tablet 0   PULMICORT FLEXHALER 180 MCG/ACT inhaler Inhale into the lungs.     rifaximin (XIFAXAN) 550 MG TABS tablet Take 1 tablet (550 mg total) by mouth 2 (two) times daily. 60 tablet 5   spironolactone (ALDACTONE) 25 MG tablet Take 0.5 tablets (12.5 mg total) by mouth daily. 30 tablet 3   torsemide (DEMADEX) 20 MG tablet Take 1 tablet (20 mg total) by mouth daily. 30 tablet 1   venlafaxine XR  (EFFEXOR-XR) 75 MG 24 hr capsule Take 1 capsule (75 mg total) by mouth daily with breakfast. 30 capsule 3   hydrochlorothiazide (HYDRODIURIL) 25 MG tablet Take 12.5 mg by mouth daily.     olmesartan (BENICAR) 40 MG tablet Take 40 mg by mouth daily.     midodrine (PROAMATINE) 10 MG tablet Take 1 tablet (10 mg total) by mouth 3 (three) times daily with meals. 90 tablet 5   magnesium oxide (MAG-OX) 400 MG tablet Take 1 tablet by mouth daily.     pantoprazole (PROTONIX) 40 MG tablet Take 1 tablet by mouth daily.     rifaximin (XIFAXAN) 550 MG TABS tablet Take 1 tablet by mouth 2 (two) times daily.     No facility-administered medications prior to visit.     Allergies:   Fluticasone   Social History   Socioeconomic History   Marital status: Married    Spouse name: Not on file   Number of children: 0   Years of education: college   Highest education level: Not on file  Occupational History   Occupation: Maintenance tech    Employer: [employer]  Tobacco Use   Smoking status: Never   Smokeless tobacco: Never  Vaping Use   Vaping Use: Never used  Substance and Sexual Activity   Alcohol use: Not Currently   Drug use: No   Sexual activity: Not Currently  Other Topics Concern   Not on file  Social History Narrative   Not on file   Social Determinants of Health   Financial Resource Strain: Not on file  Food Insecurity: Not on file  Transportation Needs: Not on file  Physical Activity: Not on file  Stress: Not on file  Social Connections: Not on file     Family History:  The patient's family history includes ALS in his mother; Aneurysm in his maternal grandmother; Arthritis in an other family member; CAD in his brother and father; Cancer in his maternal grandfather and another family member; Diabetes in an other family member; Diabetes Mellitus II in his brother, brother, father, maternal grandmother, and sister; Hodgkin's lymphoma in his father; Liver cancer in his father.    Review of Systems:    Please see the history of present illness.  All other systems reviewed and are otherwise negative except as noted above.   Physical Exam:    VS:  BP 129/65   Pulse 65   Ht 5\' 10"  (1.778 m)   Wt 239 lb 3.2 oz (108.5 kg)   BMI 34.32 kg/m    General: Well developed, well nourished,male appearing in no acute distress. Head: Normocephalic, atraumatic. Neck: No carotid bruits. JVD not elevated.  Lungs: Respirations regular and unlabored, without wheezes or rales.  Heart: Regular rate and rhythm. No S3 or S4.  2/6 SEM along RUSB.  Abdomen: Appears non-distended. No obvious abdominal masses. Msk:  Strength and tone appear normal for age. No obvious joint deformities or effusions. Extremities: No clubbing or cyanosis. Trace lower extremity edema.  Distal pedal pulses are 2+ bilaterally. Neuro: Alert and oriented X 3. Moves all extremities spontaneously. No focal deficits noted. Psych:  Responds to questions appropriately with a normal affect. Skin: No rashes or lesions noted  Wt Readings from Last 3 Encounters:  10/01/21 239 lb 3.2 oz (108.5 kg)  09/30/21 239 lb 6.7 oz (108.6 kg)  08/25/21 266 lb 12.1 oz (121 kg)    Studies/Labs Reviewed:   EKG:  EKG is not ordered today.    Recent Labs: 02/02/2021: TSH 1.901 07/28/2021: B Natriuretic Peptide 351.0 08/22/2021: ALT 9; BUN 12; Creatinine, Ser 1.09; Magnesium 1.9; Potassium 4.0; Sodium 137 09/23/2021: Hemoglobin 11.0; Platelets 102   Lipid Panel    Component Value Date/Time   CHOL 120 05/25/2019 1001   TRIG 136 05/25/2019 1001   HDL 38 (L) 05/25/2019 1001   CHOLHDL 3.2 05/25/2019 1001   VLDL 27 05/25/2019 1001   LDLCALC 55 05/25/2019 1001    Additional studies/ records that were reviewed today include:   Cardiac Catheterization: 08/2015 Mid RCA to Dist RCA lesion, 100% stenosed. 1st Mrg lesion, 90% stenosed. Ramus lesion, 70% stenosed. Ost Cx to Dist Cx lesion, 60% stenosed. Ost 1st Diag  lesion, 90% stenosed. Ost 2nd Diag to 2nd Diag lesion, 70% stenosed. Mid LAD lesion, 90% stenosed. Post Atrio lesion, 90% stenosed. Dist RCA lesion, 100% stenosed.   Severe native vessel coronary artery disease with total occlusion of the distal RCA (likely culprit for acute presentation), high-grade obstruction in the proximal to mid LAD, high-grade obstruction in the first obtuse marginal, significant stenosis in the ramus intermedius, first diagonal, and second diagonal. LVEF 50% with marked elevation in LVEDP, consistent with acute diastolic left heart failure. Distal right coronary is collateralized from the LAD and circumflex. The right coronary is dominant.     RECOMMENDATIONS:   TCTS consultation for consideration of surgical revascularization. Continue aspirin. No Plavix. In TIMI IV heparin.   Echocardiogram: 05/2020 IMPRESSIONS     1. Left ventricular ejection fraction, by estimation, is 60 to 65%. The  left ventricle has normal function. The left ventricle has no regional  wall motion abnormalities. The left ventricular internal cavity size was  mildly dilated. There is mild left  ventricular hypertrophy. Left ventricular diastolic parameters are  indeterminate.   2. Right ventricular systolic function is normal. The right ventricular  size is normal. There is normal pulmonary artery systolic pressure.   3. The mitral valve is abnormal. Mild mitral valve regurgitation.   4. AV is thickened, calcifieid Difficult to see well. Peak and mean  gradients through the valve are 31 and 18 mm Hg respectively consistent  with mild AS. Compared to echo report from 2019, gradients are increased.  . The aortic valve is  abnormal. Aortic  valve regurgitation is not visualized. Mild aortic valve stenosis.   5. The inferior vena cava is dilated in size with <50% respiratory  variability, suggesting right atrial pressure of 15 mmHg.   Echocardiogram: 12/2020 NORMAL LEFT VENTRICULAR  SYSTOLIC FUNCTION    NORMAL RIGHT VENTRICULAR SYSTOLIC FUNCTION    NO VALVULAR REGURGITATION    VALVULAR STENOSIS: MILD AS    POOR SOUND TRANSMISSION   Assessment:    1. Coronary artery disease involving native coronary artery of native heart without angina pectoris   2. Chronic diastolic heart failure ([place])   3. Nonrheumatic aortic valve stenosis   4. Stage 3a chronic kidney disease ([place])   5. Cirrhosis of liver with ascites, unspecified hepatic cirrhosis type ([place])   6. OSA on CPAP      Plan:   In order of problems listed above:  1. CAD - He is s/p CABG in 2016 and he did undergo a PET myocardial perfusion study in 01/2021 at [facility] which showed inferior wall scar and mild peri-infarct ischemia with medical management recommended at that time.  - He denies any recent anginal symptoms. Continue current medication regimen with ASA 81 mg daily, Nadolol 20 mg daily and Pravastatin 20 mg daily.  2. HFpEF - He has lost over 30 pounds within the past 3 months with dietary changes and reports his breathing has been stable. He only has trace edema on examination today and lungs are clear. - Continue Torsemide 20 mg daily along with Spironolactone 12.5 mg daily. He is listed as taking both Benicar and HCTZ but both of these were discontinued during prior admissions given hypotension. I did encourage him to check his medications upon returning home to make sure he is no longer taking these. He does remain on Midodrine 10 mg TID for BP support in the setting of adrenal insufficiency.   3. Aortic Stenosis - This was mild by repeat echocardiogram at [facility] in 12/2020. Will continue to follow.  4. Stage 3 CKD - His creatinine was stable at 1.0 - 1.1 during his recent admission.   5. Hepatic Cirrhosis - He is being followed by [facility] with continued plans for eventual liver transplant. His fluid status overall remains stable at this time. He does remain on Lactulose, Nadolol, Spironolactone and  Torsemide.  6. OSA - He does use a CPAP on a nightly basis and continued compliance with this was encouraged.   He does have previously scheduled follow-up with Dr. Domenic Polite next month and wishes to keep that for now. We reviewed if symptoms remain stable, this could be pushed out for 2-3 months and he is aware to give [place] 24-48 hours notice if wishing to cancel the visit.   Signed, Erma Heritage, PA-C  10/01/2021 5:05 PM    [address] [place], [place] [address] Phone: [phone] Fax: [phone]

## 2021-09-30 NOTE — Patient Instructions (Signed)
[place] at [facility] Discharge Instructions  You were seen today by Tarri Abernethy PA-C for your low platelets and your low blood count.  LOW PLATELETS: This is called "thrombocytopenia."  As we have discussed, this is secondary to your liver cirrhosis and enlarged spleen.  At this time, your platelet levels are mildly low, but stable within their baseline range.  You are not at any increased risk for spontaneous bleeding events from your platelets.  We will continue to monitor them and repeat follow-up visits, but at this time you do not need any treatment for your low platelets.  ANEMIA: Your blood count and iron levels are low due to your GI bleeding.  Because of your cirrhosis and esophageal varices, you will always be at high risk for recurrent GI bleeding, and will require ongoing monitoring and occasional IV iron treatments in the future.  At this time, we will schedule you for IV iron treatments x3 (Venofer).  We will also check your CBC once a month to make sure you are not losing any large amounts of blood in between visits.  LABS: Return every month for labs.  MEDICATIONS: IV iron x3 doses  FOLLOW-UP APPOINTMENT: Office visit in 2 months, same day as labs   Thank you for choosing [place] at [facility] to provide your oncology and hematology care.  To afford each patient quality time with our provider, please arrive at least 15 minutes before your scheduled appointment time.   If you have a lab appointment with the [facility] please come in thru the Main Entrance and check in at the main information desk.  You need to re-schedule your appointment should you arrive 10 or more minutes late.  We strive to give you quality time with our providers, and arriving late affects you and other patients whose appointments are after yours.  Also, if you no show three or more times for appointments you may be dismissed from the clinic at  the providers discretion.     Again, thank you for choosing [facility].  Our hope is that these requests will decrease the amount of time that you wait before being seen by our physicians.       _____________________________________________________________  Should you have questions after your visit to [facility], please contact our office at [phone] and follow the prompts.  Our office hours are 8:00 a.m. and 4:30 p.m. Monday - Friday.  Please note that voicemails left after 4:00 p.m. may not be returned until the following business day.  We are closed weekends and major holidays.  You do have access to a nurse 24-7, just call the main number to the clinic [phone] and do not press any options, hold on the line and a nurse will answer the phone.    For prescription refill requests, have your pharmacy contact our office and allow 72 hours.    Due to Covid, you will need to wear a mask upon entering the hospital. If you do not have a mask, a mask will be given to you at the Main Entrance upon arrival. For doctor visits, patients may have 1 support person age 43 or older with them. For treatment visits, patients can not have anyone with them due to social distancing guidelines and our immunocompromised population.

## 2021-09-30 NOTE — Progress Notes (Signed)
[name], [place] [address]   CLINIC:  Medical Oncology/Hematology  PCP:  [name], [place] [address] [phone]   REASON FOR VISIT:  Follow-up for anemia and thrombocytopenia  CURRENT THERAPY: Intermittent IV iron infusions  INTERVAL HISTORY:  [name] 60 y.o. male returns for routine follow-up of anemia and thrombocytopenia in the setting of liver cirrhosis.  He was last seen by Christopher Abernethy PA-C on 07/15/2021.  Patient has had multiple hospitalizations since his last visit. 07/21/2021 through 07/25/2021: Presented to ED with Hgb 6.7 and admitted for acute blood loss anemia.  Patient had EGD on 07/23/2021 with banding of grade 2 esophageal varices.  He was transfused PRBC x1 on 07/22/2021. 08/11/2021 through 08/25/2021: Hepatic encephalopathy, dehydration, electrolyte imbalances, generalized weakness, and falls.  He spent 1 week in SNF after hospital discharge for rehabilitation.  Most recent EGD (07/23/2021) 2 columns of grade 2 varices s/p banding; moderate portal hypertensive gastropathy with small amount of fresh blood in this region, but without any active oozing; moderate gastric antral vascular ectasia without bleeding in gastric antrum.  Of note, patient had previously had banding of grade 2 esophageal varices done at [facility] on 06/18/2021.  At today's visit, he reports feeling improved and is recovering well.    Even during his episode of acute blood loss anemia, he did not notice any hematemesis, hematochezia, or melena.  He continues to deny any gross signs of GI hemorrhage.  He does admit to easy bruising but denies any petechial rash.  He continues to have some fatigue as he is recovering from his recent hospital stays.  He denies any pica, restless legs, chest pain, dyspnea on exertion, lightheadedness, or syncope.  He has 75% energy and 75% appetite. He has been working on losing weight in order to  improve his transplant eligibility for a new liver.   REVIEW OF SYSTEMS:  Review of Systems  Constitutional:  Positive for fatigue. Negative for appetite change, chills, diaphoresis, fever and unexpected weight change.  HENT:   Negative for lump/mass and nosebleeds.   Eyes:  Negative for eye problems.  Respiratory:  Negative for cough, hemoptysis and shortness of breath.   Cardiovascular:  Negative for chest pain, leg swelling and palpitations.  Gastrointestinal:  Negative for abdominal pain, blood in stool, constipation, diarrhea, nausea and vomiting.  Genitourinary:  Negative for hematuria.   Musculoskeletal:  Positive for arthralgias (right knee).  Skin: Negative.   Neurological:  Negative for dizziness, headaches and light-headedness.  Hematological:  Does not bruise/bleed easily.     PAST MEDICAL/SURGICAL HISTORY:  Past Medical History:  Diagnosis Date   Anemia    Arthritis    CAD (coronary artery disease)    Multivessel disease status post CABG 08/2015   Cirrhosis (HCC)    CKD (chronic kidney disease) stage 3, GFR 30-59 ml/min (HCC)    Diastolic CHF (HCC)    Essential hypertension    Hyperlipidemia    Iron deficiency anemia 07/15/2021   OSA on CPAP    Polyclonal gammopathy    Thrombocytopenia (HCC) 2016   Type 2 diabetes mellitus ([place])    Past Surgical History:  Procedure Laterality Date   APPENDECTOMY     Biceps tendon surgery Right    BIOPSY  11/22/2019   Procedure: BIOPSY;  Surgeon: Daneil Dolin, MD;  [facility];  Service: Endoscopy;;   CARDIAC CATHETERIZATION N/A 08/25/2015   Procedure: Left Heart Cath and  Coronary Angiography;  Surgeon: Belva Crome, MD;  Location: [place] [facility];  Service: Cardiovascular;  Laterality: N/A;   COLONOSCOPY WITH PROPOFOL N/A 11/22/2019   Procedure: COLONOSCOPY WITH PROPOFOL;  Surgeon: Daneil Dolin, MD; Four 4-5 mm polyps, findings suggestive of portal colopathy, congested appearing colonic mucosa diffusely,  rectal varices, and adequate right colon prep.  Pathology with tubular adenomas and hyperplastic polyp.  Right colon biopsy with focal active colitis.  Recommendations to repeat colonoscopy in 3 months due to poor prep.   COLONOSCOPY WITH PROPOFOL N/A 03/17/2020   Procedure: COLONOSCOPY WITH PROPOFOL;  Surgeon: Daneil Dolin, MD;  [facility];  Service: Endoscopy;  Laterality: N/A;  8:45am - pt does not need covid test, was + 2/4 <90 days   CORONARY ARTERY BYPASS GRAFT N/A 08/29/2015   Procedure: CORONARY ARTERY BYPASS GRAFTING (CABG);  Surgeon: [name], MD;  Location: [place];  Service: Open Heart Surgery;  Laterality: N/A;   ESOPHAGEAL BANDING N/A 07/23/2021   Procedure: ESOPHAGEAL BANDING;  Surgeon: Eloise Harman, DO;  [facility];  Service: Endoscopy;  Laterality: N/A;   ESOPHAGOGASTRODUODENOSCOPY (EGD) WITH PROPOFOL N/A 11/22/2019   Procedure: ESOPHAGOGASTRODUODENOSCOPY (EGD) WITH PROPOFOL;  Surgeon: Daneil Dolin, MD; 4 columns of grade 2-3 esophageal varices, portal gastropathy, gastric polyp/abnormal gastric mucosa s/p biopsy.  Pathology with hyperplastic polyp, mild chronic gastritis, negative H. pylori.   ESOPHAGOGASTRODUODENOSCOPY (EGD) WITH PROPOFOL N/A 07/23/2021   Procedure: ESOPHAGOGASTRODUODENOSCOPY (EGD) WITH PROPOFOL;  Surgeon: Eloise Harman, DO;  [facility];  Service: Endoscopy;  Laterality: N/A;   KNEE ARTHROSCOPY Left    TEE WITHOUT CARDIOVERSION N/A 08/29/2015   Procedure: TRANSESOPHAGEAL ECHOCARDIOGRAM (TEE);  Surgeon: [name], MD;  Location: [place];  Service: Open Heart Surgery;  Laterality: N/A;   TOTAL KNEE ARTHROPLASTY Left 03/09/2017   Procedure: TOTAL KNEE ARTHROPLASTY;  Surgeon: Carole Civil, MD;  Location: [facility];  Service: Orthopedics;  Laterality: Left;     SOCIAL HISTORY:  Social History   Socioeconomic History   Marital status: Married    Spouse name: Not on file   Number of children: 0    Years of education: college   Highest education level: Not on file  Occupational History   Occupation: Maintenance tech    Employer: [employer]  Tobacco Use   Smoking status: Never   Smokeless tobacco: Never  Vaping Use   Vaping Use: Never used  Substance and Sexual Activity   Alcohol use: Not Currently   Drug use: No   Sexual activity: Not Currently  Other Topics Concern   Not on file  Social History Narrative   Not on file   Social Determinants of Health   Financial Resource Strain: Not on file  Food Insecurity: Not on file  Transportation Needs: Not on file  Physical Activity: Not on file  Stress: Not on file  Social Connections: Not on file  Intimate Partner Violence: Not on file    FAMILY HISTORY:  Family History  Problem Relation Age of Onset   Arthritis Other    Cancer Other    Diabetes Other    CAD Father    Diabetes Mellitus II Father    Liver cancer Father    Hodgkin's lymphoma Father    CAD Brother    Diabetes Mellitus II Brother    ALS Mother    Diabetes Mellitus II Sister    Diabetes Mellitus II Brother    Diabetes  Mellitus II Maternal Grandmother    Aneurysm Maternal Grandmother    Cancer Maternal Grandfather    Anesthesia problems Neg Hx    Hypotension Neg Hx    Malignant hyperthermia Neg Hx    Pseudochol deficiency Neg Hx    Colon cancer Neg Hx     CURRENT MEDICATIONS:  Outpatient Encounter Medications as of 09/30/2021  Medication Sig Note   acetaminophen (TYLENOL) 325 MG tablet Take 650 mg by mouth every 6 (six) hours as needed for mild pain.    albuterol (VENTOLIN HFA) 108 (90 Base) MCG/ACT inhaler Inhale 2 puffs into the lungs every 6 (six) hours as needed for wheezing or shortness of breath.    ALPRAZolam (XANAX) 0.5 MG tablet Take 1 tablet (0.5 mg total) by mouth daily as needed.    aspirin EC 81 MG tablet Take 1 tablet (81 mg total) by mouth daily with breakfast.    Cholecalciferol (DIALYVITE VITAMIN D 5000) 125 MCG (5000 UT)  capsule Take 5,000 Units by mouth in the morning.     ferrous sulfate 325 (65 FE) MG tablet Take 1 tablet (325 mg total) by mouth daily with breakfast.    insulin aspart (NOVOLOG) 100 UNIT/ML injection Inject 5 Units into the skin 3 (three) times daily before meals. Special Instructions: If accu-check is greater than 150. Hold for accu-check 150 or below. With Meals    insulin degludec (TRESIBA FLEXTOUCH) 200 UNIT/ML FlexTouch Pen Inject 40 Units into the skin in the morning. Give 30 units Subcutaneous  at Bedtime 8:00 pm    lactulose (CHRONULAC) 10 GM/15ML solution Take 30 mLs (20 g total) by mouth daily.    lidocaine (XYLOCAINE) 2 % solution 5 mLs as needed. (Patient not taking: Reported on 07/28/2021)    loratadine (CLARITIN) 10 MG tablet Take 10 mg by mouth in the morning.     magnesium oxide (MAG-OX) 400 (240 Mg) MG tablet Take 1 tablet (400 mg total) by mouth daily.    midodrine (PROAMATINE) 10 MG tablet Take 1 tablet (10 mg total) by mouth 3 (three) times daily with meals.    nadolol (CORGARD) 20 MG tablet Take by mouth daily.    ondansetron (ZOFRAN) 4 MG tablet Take 1 tablet (4 mg total) by mouth every 6 (six) hours as needed for nausea.    OZEMPIC, 1 MG/DOSE, 4 MG/3ML SOPN Inject 1 mg into the skin once a week. 08/11/2021: Fridays    pantoprazole (PROTONIX) 40 MG tablet Take 1 tablet (40 mg total) by mouth daily.    potassium chloride (KLOR-CON) 10 MEQ tablet Take 1 tablet (10 mEq total) by mouth daily. Take While taking Torsemide    pravastatin (PRAVACHOL) 20 MG tablet TAKE 1 TABLET(20 MG) BY MOUTH AT BEDTIME    rifaximin (XIFAXAN) 550 MG TABS tablet Take 1 tablet (550 mg total) by mouth 2 (two) times daily.    spironolactone (ALDACTONE) 25 MG tablet Take 0.5 tablets (12.5 mg total) by mouth daily.    torsemide (DEMADEX) 20 MG tablet Take 1 tablet (20 mg total) by mouth daily.    venlafaxine XR (EFFEXOR-XR) 75 MG 24 hr capsule Take 1 capsule (75 mg total) by mouth daily with breakfast.     No facility-administered encounter medications on file as of 09/30/2021.    ALLERGIES:  Allergies  Allergen Reactions   Fluticasone Rash     PHYSICAL EXAM:  ECOG PERFORMANCE STATUS: 2 - Symptomatic, <50% confined to bed  There were no vitals filed for this visit. There  were no vitals filed for this visit. Physical Exam Constitutional:      Appearance: Normal appearance. He is obese.  HENT:     Head: Normocephalic and atraumatic.     Mouth/Throat:     Mouth: Mucous membranes are moist.  Eyes:     Extraocular Movements: Extraocular movements intact.     Pupils: Pupils are equal, round, and reactive to light.  Cardiovascular:     Rate and Rhythm: Normal rate and regular rhythm.     Pulses: Normal pulses.     Heart sounds: Normal heart sounds.  Pulmonary:     Effort: Pulmonary effort is normal.     Breath sounds: Normal breath sounds.  Abdominal:     General: Bowel sounds are normal.     Palpations: Abdomen is soft.     Tenderness: There is no abdominal tenderness.  Musculoskeletal:        General: No swelling.     Right lower leg: No edema.     Left lower leg: No edema.  Lymphadenopathy:     Cervical: No cervical adenopathy.  Skin:    General: Skin is warm and dry.  Neurological:     General: No focal deficit present.     Mental Status: He is alert and oriented to person, place, and time.  Psychiatric:        Mood and Affect: Mood normal.        Behavior: Behavior normal.     LABORATORY DATA:  I have reviewed the labs as listed.  CBC    Component Value Date/Time   WBC 5.6 09/23/2021 1215   RBC 3.68 (L) 09/23/2021 1215   HGB 11.0 (L) 09/23/2021 1215   HGB 12.5 (L) 05/03/2019 1005   HGB 10.1 (L) 12/15/2018 1525   HCT 33.6 (L) 09/23/2021 1215   HCT 30.2 (L) 12/15/2018 1525   PLT 102 (L) 09/23/2021 1215   PLT 72 (L) 05/03/2019 1005   PLT 87 (LL) 12/15/2018 1525   MCV 91.3 09/23/2021 1215   MCV 84 12/15/2018 1525   MCH 29.9 09/23/2021 1215   MCHC  32.7 09/23/2021 1215   RDW 15.0 09/23/2021 1215   RDW 15.1 12/15/2018 1525   LYMPHSABS 0.7 09/23/2021 1215   LYMPHSABS 1.5 12/15/2018 1525   MONOABS 0.5 09/23/2021 1215   EOSABS 0.1 09/23/2021 1215   EOSABS 0.1 12/15/2018 1525   BASOSABS 0.0 09/23/2021 1215   BASOSABS 0.0 12/15/2018 1525   CMP Latest Ref Rng & Units 08/22/2021 08/21/2021 08/20/2021  Glucose 70 - 99 mg/dL 99 149(H) 105(H)  BUN 8 - 23 mg/dL 12 14 12   Creatinine 0.61 - 1.24 mg/dL 1.09 1.09 1.13  Sodium 135 - 145 mmol/L 137 136 135  Potassium 3.5 - 5.1 mmol/L 4.0 4.0 4.1  Chloride 98 - 111 mmol/L 107 106 104  CO2 22 - 32 mmol/L 25 24 25   Calcium 8.9 - 10.3 mg/dL 8.5(L) 8.4(L) 8.5(L)  Total Protein 6.5 - 8.1 g/dL 6.2(L) 6.3(L) 6.2(L)  Total Bilirubin 0.3 - 1.2 mg/dL 0.8 1.0 1.1  Alkaline Phos 38 - 126 U/L 73 70 72  AST 15 - 41 U/L 23 21 24   ALT 0 - 44 U/L 9 8 9     DIAGNOSTIC IMAGING:  I have independently reviewed the relevant imaging and discussed with the patient.  ASSESSMENT & PLAN:    1.  Moderate thrombocytopenia, secondary to cirrhosis/splenomegaly -Thrombocytopenia since 2016.  Platelets between 55-90 since 2018. - BMBX on 01/26/2019 showed slightly hypercellular marrow  for age with trilineage hematopoiesis.  No significant dyspoiesis or increase in blasts.  Plasma cells are slightly increased in number but with polyclonal staining pattern.  Abundant megakaryocytes with scattered small hypolobulated forms.  Chromosome analysis was normal.  Biopsy was done for work-up of increased IgA levels. - Ultrasound abdomen on 01/15/2020 showed nodular contour of the liver consistent with cirrhosis.  Splenomegaly with volume of 1562 mL. -Thrombocytopenia secondary to splenomegaly from cirrhosis - Most recent CBC (09/23/2021): Platelets 102, within baseline range - He admits to easy bruising but denies petechial rash or major bleeding events - PLAN: Platelets stable at baseline.  No treatment indicated at this time.  We will  continue to monitor. - If platelets drop to < 30,000 would consider referral for partial splenic embolization.  2.  Normocytic to macrocytic anemia: - Combination anemia from CKD, iron deficiency, and intermittent GI bleeding -Recent hospitalization from 07/21/2021 through 07/25/2021: Presented to ED with Hgb 6.7 and admitted for acute blood loss anemia.  Patient had EGD on 07/23/2021 with banding of grade 2 esophageal varices.  He was transfused PRBC x1 on 07/22/2021. - Colonoscopy on 03/17/2020 showed inadequate preparation of the colon with rectal varices.  Much of the colon could not be seen. - EGD (06/18/2021) at [facility]: Grade 2 esophageal varices s/p banding; gastric antral vascular ectasia without bleeding, portal hypertensive gastropathy - EGD (07/23/2021): Two columns of grade 2 varices s/p banding; moderate portal hypertensive gastropathy with small amount of fresh blood in this region, but without any active oozing; moderate gastric antral vascular ectasia without bleeding in gastric antrum. - Most recent blood transfusion with PRBC x1 on 07/22/2021 - Most recent IV iron with Venofer on 07/20/2021 - Denies any signs or symptoms of gross rectal hemorrhage or melena, even during his episode of acute blood loss anemia - Symptomatic with fatigue  - Most recent labs (09/23/2021): Hgb 11.0 with MCV at 91.3.  Ferritin 33 with TIBC 405 and iron saturation 34% - Differential diagnosis favors anemia secondary to iron deficiency, suspect chronic GI blood loss in the setting of cirrhosis and esophageal varices; macrocytosis secondary to liver disease; element of mild CKD may also be contributing to his anemia  - PLAN: Proceed with IV iron (Venofer 300-300-400). - Due to concern for ongoing blood loss, we will check CBC once per month. - RTC in 2 months for repeat iron panel, CBC, and follow-up visit.  3.  Positive rheumatoid factor: -Work-up for thrombocytopenia showed rheumatoid factor positive at 58.6.  ANA  was negative. -He does not have any clinical signs or symptoms of rheumatoid arthritis.   4.  Adrenal insufficiency: - He was hospitalized in the first week of April 2022 with adrenal insufficiency. - Was placed on hydrocortisone 20 mg twice daily and midodrine 15 mg 3 times daily - His blood pressure is well maintained at this time.  5.  Liver cirrhosis - He follows with [facility] and is undergoing evaluation to be put on transplant list   PLAN SUMMARY & DISPOSITION: Proceed with IV iron (Venofer 300-300-400). Due to concern for ongoing blood loss, we will check CBC once per month. RTC in 2 months for repeat iron panel, CBC, and follow-up visit.  All questions were answered. The patient knows to call the clinic with any problems, questions or concerns.  Medical decision making: Moderate  Time spent on visit: I spent 20 minutes counseling the patient face to face. The total time spent in the appointment was 30 minutes and more  than 50% was on counseling.   Harriett Rush, PA-C  09/30/21 12:06 PM

## 2021-10-01 ENCOUNTER — Ambulatory Visit (INDEPENDENT_AMBULATORY_CARE_PROVIDER_SITE_OTHER): Payer: Federal, State, Local not specified - PPO | Admitting: Student

## 2021-10-01 ENCOUNTER — Encounter: Payer: Self-pay | Admitting: Student

## 2021-10-01 VITALS — BP 129/65 | HR 65 | Ht 70.0 in | Wt 239.2 lb

## 2021-10-01 DIAGNOSIS — Z9989 Dependence on other enabling machines and devices: Secondary | ICD-10-CM

## 2021-10-01 DIAGNOSIS — N1831 Chronic kidney disease, stage 3a: Secondary | ICD-10-CM | POA: Diagnosis not present

## 2021-10-01 DIAGNOSIS — I350 Nonrheumatic aortic (valve) stenosis: Secondary | ICD-10-CM | POA: Diagnosis not present

## 2021-10-01 DIAGNOSIS — I25810 Atherosclerosis of coronary artery bypass graft(s) without angina pectoris: Secondary | ICD-10-CM

## 2021-10-01 DIAGNOSIS — I2510 Atherosclerotic heart disease of native coronary artery without angina pectoris: Secondary | ICD-10-CM

## 2021-10-01 DIAGNOSIS — K7460 Unspecified cirrhosis of liver: Secondary | ICD-10-CM

## 2021-10-01 DIAGNOSIS — I5032 Chronic diastolic (congestive) heart failure: Secondary | ICD-10-CM

## 2021-10-01 DIAGNOSIS — G4733 Obstructive sleep apnea (adult) (pediatric): Secondary | ICD-10-CM

## 2021-10-01 DIAGNOSIS — R188 Other ascites: Secondary | ICD-10-CM

## 2021-10-01 NOTE — Patient Instructions (Signed)
Medication Instructions:  1.Make sure you are NOT taking HCTZ 12.5mg  daily and NOT taking Olmesartan 40mg  daily. These were stopped during your admission.  *If you need a refill on your cardiac medications before your next appointment, please call your pharmacy*   Lab Work: None If you have labs (blood work) drawn today and your tests are completely normal, you will receive your results only by: [place] (if you have MyChart) OR A paper copy in the mail If you have any lab test that is abnormal or we need to change your treatment, we will call you to review the results.   Follow-Up: At [facility], you and your health needs are our priority.  As part of our continuing mission to provide you with exceptional heart care, we have created designated Provider Care Teams.  These Care Teams include your primary Cardiologist (physician) and Advanced Practice Providers (APPs -  Physician Assistants and Nurse Practitioners) who all work together to provide you with the care you need, when you need it.   Your next appointment:   10/30/21 at 1:00 PM   The format for your next appointment:   In Person  Provider:   Rozann Lesches, MD

## 2021-10-05 ENCOUNTER — Other Ambulatory Visit: Payer: Self-pay

## 2021-10-05 ENCOUNTER — Encounter (HOSPITAL_COMMUNITY): Payer: Self-pay

## 2021-10-05 ENCOUNTER — Inpatient Hospital Stay (HOSPITAL_COMMUNITY): Payer: Federal, State, Local not specified - PPO

## 2021-10-05 VITALS — BP 112/66 | HR 71 | Temp 98.0°F | Resp 18

## 2021-10-05 DIAGNOSIS — D539 Nutritional anemia, unspecified: Secondary | ICD-10-CM

## 2021-10-05 DIAGNOSIS — R161 Splenomegaly, not elsewhere classified: Secondary | ICD-10-CM | POA: Diagnosis not present

## 2021-10-05 DIAGNOSIS — D631 Anemia in chronic kidney disease: Secondary | ICD-10-CM | POA: Diagnosis not present

## 2021-10-05 DIAGNOSIS — I8500 Esophageal varices without bleeding: Secondary | ICD-10-CM | POA: Diagnosis not present

## 2021-10-05 DIAGNOSIS — E2740 Unspecified adrenocortical insufficiency: Secondary | ICD-10-CM | POA: Diagnosis not present

## 2021-10-05 DIAGNOSIS — Z79899 Other long term (current) drug therapy: Secondary | ICD-10-CM | POA: Diagnosis not present

## 2021-10-05 DIAGNOSIS — D509 Iron deficiency anemia, unspecified: Secondary | ICD-10-CM

## 2021-10-05 DIAGNOSIS — D696 Thrombocytopenia, unspecified: Secondary | ICD-10-CM | POA: Diagnosis not present

## 2021-10-05 DIAGNOSIS — N189 Chronic kidney disease, unspecified: Secondary | ICD-10-CM | POA: Diagnosis not present

## 2021-10-05 DIAGNOSIS — I129 Hypertensive chronic kidney disease with stage 1 through stage 4 chronic kidney disease, or unspecified chronic kidney disease: Secondary | ICD-10-CM | POA: Diagnosis not present

## 2021-10-05 DIAGNOSIS — K7460 Unspecified cirrhosis of liver: Secondary | ICD-10-CM | POA: Diagnosis not present

## 2021-10-05 MED ORDER — SODIUM CHLORIDE 0.9 % IV SOLN
300.0000 mg | Freq: Once | INTRAVENOUS | Status: AC
  Administered 2021-10-05: 300 mg via INTRAVENOUS
  Filled 2021-10-05: qty 300

## 2021-10-05 MED ORDER — SODIUM CHLORIDE 0.9 % IV SOLN: Freq: Once | INTRAVENOUS | Status: AC

## 2021-10-05 MED ORDER — LORATADINE 10 MG PO TABS
10.0000 mg | ORAL_TABLET | Freq: Once | ORAL | Status: AC
  Administered 2021-10-05: 10 mg via ORAL
  Filled 2021-10-05: qty 1

## 2021-10-05 NOTE — Progress Notes (Signed)
PATIENT: [name] DOB: 12-31-1960  REASON FOR VISIT: follow up HISTORY FROM: patient  Virtual Visit via Telephone Note  I connected with [name] on 10/07/21 at 10:00 AM EDT by telephone and verified that I am speaking with the correct person using two identifiers.   I discussed the limitations, risks, security and privacy concerns of performing an evaluation and management service by telephone and the availability of in person appointments. I also discussed with the patient that there may be a patient responsible charge related to this service. The patient expressed understanding and agreed to proceed.   History of Present Illness:   10/07/21 ALL:  [name] is a 59 y.o. male here today for follow up for OSA on CPAP.  He is doing well with new machine. He feels it is much quieter. He denies concerns with machine or supplies. He was hospitalized 08/11/2021 for hypokalemia and dehydration. He admits that 4 hour usage has been a little low since. He is recovering well. He does note a leak in mask at times.      HISTORY: (copied from Dr Dohmeier's previous note)  Interval history : 10-16-2020: I have the pleasure of meeting today with [name]. [name] who is called [name].  He is a meanwhile 60 year old and some 60 year old Caucasian gentleman who has followed in our practice for over 5 years with a diagnosis of obstructive sleep apnea snoring and being treated on CPAP.  His risk factors have always included morbid obesity. He states that he was recently told that he will need a liver transplant in response to a NASH nonalcoholic steatosis of the liver. Liver is cirrhotic. Had pancreatic failure-  He states that his pancreas makes neither digestive enzymes nor insulin. His diabetes is better controlled - he has a no needle check device.    He endorsed today the Epworth Sleepiness Scale at only 4 points which is a remarkable response in the past he had  sometimes been excessively daytime sleepy.  His fatigue score was high also at 53 out of 63 points and he stated today that he does feel sometimes depressed.  This is a score of 3 out of 15 points so a mild depression.  His C-Flex machine was interrogated today is a DreamStation auto CPAP this device had been recalled.  He has used it compliantly for 97% of the last 30 days usual average daily use is 4 hours 42 minutes only but he achieves a reduced AHI of only 0.3/h and his CPAP is set at 9 cm water.  There are sometimes air leaks but these seem to be related to movement going to the bathroom etc.  Coumadin humidification setting has been medium, he has a C-Flex function of 1 cm water, the equivalent of an expiratory pressure relief.  He is on 10 cm ramp starting at 4 cmH2O pressure and reaching his max pressure of 9 cm.   He never used an ozone cleaner- his machine doesn't make noise and he has not noted particles in his water-chamber.   Due to supply chain delays, I cannot promise a replacement device to reach him  before January anyway. So I will order an HST to establish his baseline this year and order his new machine based on that- in the meantime he will continue to use the [employer] device.    Observations/Objective:  Generalized: Well developed, in no acute distress  Mentation: Alert oriented to time, place, history taking. Follows all  commands speech and language fluent   Assessment and Plan:  60 y.o. year old male  has a past medical history of Anemia, Arthritis, CAD (coronary artery disease), Cirrhosis ([place]), CKD (chronic kidney disease) stage 3, GFR 30-59 ml/min ([place]), Diastolic CHF ([place]), Essential hypertension, Hyperlipidemia, Iron deficiency anemia (07/15/2021), OSA on CPAP, Polyclonal gammopathy, Thrombocytopenia ([place]) (2016), and Type 2 diabetes mellitus ([place]). here with    ICD-10-CM   1. OSA on CPAP  G47.33    Z99.89      He is doing well with new CPAP machine.  Compliance report shows optimal daily usage but sub optimal 4 hour compliance. I have encouraged him to work on using CPAP every night for at least 4 hours. He will monitor for leak and let me know if it continues. I will recheck download in 6-8 weeks to assess leak and compliance. He will plan to follow up in 1 year, sooner if needed.   No orders of the defined types were placed in this encounter.   No orders of the defined types were placed in this encounter.    Follow Up Instructions:  I discussed the assessment and treatment plan with the patient. The patient was provided an opportunity to ask questions and all were answered. The patient agreed with the plan and demonstrated an understanding of the instructions.   The patient was advised to call back or seek an in-person evaluation if the symptoms worsen or if the condition fails to improve as anticipated.  I provided 15 minutes of non-face-to-face time during this encounter. Patient located at their place of residence during [place] visit. Provider is in the office.    Debbora Presto, NP

## 2021-10-05 NOTE — Progress Notes (Signed)
[name] here for venofer 300 mg.  Claritin 10 mg pre-medication given.  Tolerated infusion well today without incidence.  Stable during and after infusion.  AVS reviewed.  Vital signs stable prior to discharge.  Discharged in stable condition ambulatory.

## 2021-10-05 NOTE — Patient Instructions (Signed)
[facility]  Discharge Instructions: °Thank you for choosing [facility] to provide your oncology and hematology care.  °If you have a lab appointment with the Cancer Center, please come in thru the Main Entrance and check in at the main information desk. ° °Wear comfortable clothing and clothing appropriate for easy access to any Portacath or PICC line.  ° °We strive to give you quality time with your provider. You may need to reschedule your appointment if you arrive late (15 or more minutes).  Arriving late affects you and other patients whose appointments are after yours.  Also, if you miss three or more appointments without notifying the office, you may be dismissed from the clinic at the provider’s discretion.    °  °For prescription refill requests, have your pharmacy contact our office and allow 72 hours for refills to be completed.   ° °Today you received the following chemotherapy and/or immunotherapy agents venofer 300 mg    °  °To help prevent nausea and vomiting after your treatment, we encourage you to take your nausea medication as directed. ° °BELOW ARE SYMPTOMS THAT SHOULD BE REPORTED IMMEDIATELY: °*FEVER GREATER THAN 100.4 F (38 °C) OR HIGHER °*CHILLS OR SWEATING °*NAUSEA AND VOMITING THAT IS NOT CONTROLLED WITH YOUR NAUSEA MEDICATION °*UNUSUAL SHORTNESS OF BREATH °*UNUSUAL BRUISING OR BLEEDING °*URINARY PROBLEMS (pain or burning when urinating, or frequent urination) °*BOWEL PROBLEMS (unusual diarrhea, constipation, pain near the anus) °TENDERNESS IN MOUTH AND THROAT WITH OR WITHOUT PRESENCE OF ULCERS (sore throat, sores in mouth, or a toothache) °UNUSUAL RASH, SWELLING OR PAIN  °UNUSUAL VAGINAL DISCHARGE OR ITCHING  ° °Items with * indicate a potential emergency and should be followed up as soon as possible or go to the Emergency Department if any problems should occur. ° °Please show the CHEMOTHERAPY ALERT CARD or IMMUNOTHERAPY ALERT CARD at check-in to the Emergency  Department and triage nurse. ° °Should you have questions after your visit or need to cancel or reschedule your appointment, please contact [facility] [phone]  and follow the prompts.  Office hours are 8:00 a.m. to 4:30 p.m. Monday - Friday. Please note that voicemails left after 4:00 p.m. may not be returned until the following business day.  We are closed weekends and major holidays. You have access to a nurse at all times for urgent questions. Please call the main number to the clinic [phone] and follow the prompts. ° °For any non-urgent questions, you may also contact your provider using MyChart. We now offer e-Visits for anyone 18 and older to request care online for non-urgent symptoms. For details visit [email or web address]. °  °Also download the MyChart app! Go to the app store, search "MyChart", open the app, select [facility], and log in with your MyChart username and password. ° °Due to Covid, a mask is required upon entering the hospital/clinic. If you do not have a mask, one will be given to you upon arrival. For doctor visits, patients may have 1 support person aged 18 or older with them. For treatment visits, patients cannot have anyone with them due to current Covid guidelines and our immunocompromised population.  °

## 2021-10-05 NOTE — Patient Instructions (Signed)
Please continue using your CPAP regularly. While your insurance requires that you use CPAP at least 4 hours each night on 70% of the nights, I recommend, that you not skip any nights and use it throughout the night if you can. Getting used to CPAP and staying with the treatment long term does take time and patience and discipline. Untreated obstructive sleep apnea when it is moderate to severe can have an adverse impact on cardiovascular health and raise her risk for heart disease, arrhythmias, hypertension, congestive heart failure, stroke and diabetes. Untreated obstructive sleep apnea causes sleep disruption, nonrestorative sleep, and sleep deprivation. This can have an impact on your day to day functioning and cause daytime sleepiness and impairment of cognitive function, memory loss, mood disturbance, and problems focussing. Using CPAP regularly can improve these symptoms.   Continue to monitor for increased air leak. Adjust mask accordingly and let me know if leak continues. I will recheck compliance in 6-8 weeks.   Follow up in 1 year

## 2021-10-06 ENCOUNTER — Encounter: Payer: Self-pay | Admitting: Internal Medicine

## 2021-10-06 ENCOUNTER — Ambulatory Visit (HOSPITAL_COMMUNITY): Payer: Federal, State, Local not specified - PPO | Attending: Internal Medicine | Admitting: Physical Therapy

## 2021-10-06 ENCOUNTER — Telehealth (INDEPENDENT_AMBULATORY_CARE_PROVIDER_SITE_OTHER): Payer: Federal, State, Local not specified - PPO | Admitting: Internal Medicine

## 2021-10-06 DIAGNOSIS — I25810 Atherosclerosis of coronary artery bypass graft(s) without angina pectoris: Secondary | ICD-10-CM

## 2021-10-06 DIAGNOSIS — K7291 Hepatic failure, unspecified with coma: Secondary | ICD-10-CM | POA: Diagnosis not present

## 2021-10-06 DIAGNOSIS — K7460 Unspecified cirrhosis of liver: Secondary | ICD-10-CM | POA: Diagnosis not present

## 2021-10-06 DIAGNOSIS — R2681 Unsteadiness on feet: Secondary | ICD-10-CM | POA: Diagnosis not present

## 2021-10-06 DIAGNOSIS — K824 Cholesterolosis of gallbladder: Secondary | ICD-10-CM | POA: Diagnosis not present

## 2021-10-06 DIAGNOSIS — M6281 Muscle weakness (generalized): Secondary | ICD-10-CM | POA: Diagnosis not present

## 2021-10-06 DIAGNOSIS — K7469 Other cirrhosis of liver: Secondary | ICD-10-CM

## 2021-10-06 NOTE — Patient Instructions (Addendum)
Good to talk with you today.  I hope you find your keys soon!  No change in your medical regimen at this time.  Continue lactulose, Xifaxan, Corgard, ProAmatine, Protonix, torsemide and spironolactone.  Keep your follow-up appointment at the [place] transplant clinic next week  You should have a another attempt at completing a colonoscopy for surveillance purposes.  As discussed, given the difficulties locally, I recommend you have your procedure done down at [facility]  Gallbladder polyp noted on prior imaging here.  This should be addressed with the folks at [facility] as well.  Ideally, we need to see you back in the office for a face-to-face encounter.  We will plan to see you back in 3 months  Consider repeat upper endoscopy in 6 to 12 months from now.

## 2021-10-06 NOTE — Progress Notes (Signed)
Referring Provider:  Primary Care Physician:  [name], MD  Primary GI:   Patient Location: Home   Provider Location: [place] office   Reason for Visit: Hospital follow-up.   Persons present on the virtual encounter, with roles:    Total time (minutes) spent on medical discussion: 10 minutes   Patient losing his car keys, visit was conducted using virtual method.  Visit was requested by patient virtually as he could not drive to the office..  Virtual Visit via MyChart Video Note Due to patient losing his keys.  Visit is conducted virtually and was requested by patient.   I connected with [name] on 10/06/21 at  3:00 PM EDT by telephone and verified that I am speaking with the correct person using two identifiers.   I discussed the limitations, risks, security and privacy concerns of performing an evaluation and management service by telephone and the availability of in person appointments. I also discussed with the patient that there may be a patient responsible charge related to this service. The patient expressed understanding and agreed to proceed.  Chief Complaint  Patient presents with   Cirrhosis    F/u. Doing okay   GI Bleeding    Hospitalized back in August.      History of Present Illness:     60 year old gentleman with [name] cirrhosis  scheduled to be seen in the office today but could not find his car keys and therefore could not come to the office.  Request telephone consultation. He was hospitalized a month ago with dehydration and falls.  Treated with IV fluids.  Question of recurrent GI bleeding underwent an EGD.  Innocent grade 2 varices were treated with band ligation.  Mark congestive portal gastropathy with mucosal oozing also noted.   Recent MELD 29.  Patient states he is listed for a new liver down at [facility].  He says currently he is not having any difficulties with overt GI bleeding/encephalopathy.  Denies abdominal pain or fever.   his  significant comorbidities include morbid obesity, coronary artery disease -  status post CABG, type 2 diabetes mellitus   And history of acute on chronic stage IIIb kidney disease. He reports he is to see the folks down at [facility] next week.  Loose ends include a history of colonic polyps in his colon has not been cleared although we have made multiple attempts locally and have been thwarted by poor prep.  History of 7 mm gallbladder polyp for which surgery was previously recommended.   Past Medical History:  Diagnosis Date   Anemia    Arthritis    CAD (coronary artery disease)    Multivessel disease status post CABG 08/2015   Cirrhosis (HCC)    CKD (chronic kidney disease) stage 3, GFR 30-59 ml/min (HCC)    Diastolic CHF (HCC)    Essential hypertension    Hyperlipidemia    Iron deficiency anemia 07/15/2021   OSA on CPAP    Polyclonal gammopathy    Thrombocytopenia (HCC) 2016   Type 2 diabetes mellitus ([place])      Past Surgical History:  Procedure Laterality Date   APPENDECTOMY     Biceps tendon surgery Right    BIOPSY  11/22/2019   Procedure: BIOPSY;  Surgeon: Daneil Dolin, MD;  [facility];  Service: Endoscopy;;   CARDIAC CATHETERIZATION N/A 08/25/2015   Procedure: Left Heart Cath and Coronary Angiography;  Surgeon: Belva Crome, MD;  Location: [facility];  Service: Cardiovascular;  Laterality: N/A;   COLONOSCOPY WITH PROPOFOL N/A 11/22/2019   Procedure: COLONOSCOPY WITH PROPOFOL;  Surgeon: Daneil Dolin, MD; Four 4-5 mm polyps, findings suggestive of portal colopathy, congested appearing colonic mucosa diffusely, rectal varices, and adequate right colon prep.  Pathology with tubular adenomas and hyperplastic polyp.  Right colon biopsy with focal active colitis.  Recommendations to repeat colonoscopy in 3 months due to poor prep.   COLONOSCOPY WITH PROPOFOL N/A 03/17/2020   Procedure: COLONOSCOPY WITH PROPOFOL;  Surgeon: Daneil Dolin, MD;  [facility];  Service: Endoscopy;  Laterality: N/A;  8:45am - pt does not need covid test, was + 2/4 <90 days   CORONARY ARTERY BYPASS GRAFT N/A 08/29/2015   Procedure: CORONARY ARTERY BYPASS GRAFTING (CABG);  Surgeon: [name], MD;  Location: [facility];  Service: Open Heart Surgery;  Laterality: N/A;   ESOPHAGEAL BANDING N/A 07/23/2021   Procedure: ESOPHAGEAL BANDING;  Surgeon: Eloise Harman, DO;  [facility];  Service: Endoscopy;  Laterality: N/A;   ESOPHAGOGASTRODUODENOSCOPY (EGD) WITH PROPOFOL N/A 11/22/2019   Procedure: ESOPHAGOGASTRODUODENOSCOPY (EGD) WITH PROPOFOL;  Surgeon: Daneil Dolin, MD; 4 columns of grade 2-3 esophageal varices, portal gastropathy, gastric polyp/abnormal gastric mucosa s/p biopsy.  Pathology with hyperplastic polyp, mild chronic gastritis, negative H. pylori.   ESOPHAGOGASTRODUODENOSCOPY (EGD) WITH PROPOFOL N/A 07/23/2021   Procedure: ESOPHAGOGASTRODUODENOSCOPY (EGD) WITH PROPOFOL;  Surgeon: Eloise Harman, DO;  [facility];  Service: Endoscopy;  Laterality: N/A;   KNEE ARTHROSCOPY Left    TEE WITHOUT CARDIOVERSION N/A 08/29/2015   Procedure: TRANSESOPHAGEAL ECHOCARDIOGRAM (TEE);  Surgeon: [name], MD;  Location: [facility];  Service: Open Heart Surgery;  Laterality: N/A;   TOTAL KNEE ARTHROPLASTY Left 03/09/2017   Procedure: TOTAL KNEE ARTHROPLASTY;  Surgeon: Carole Civil, MD;  Location: [facility];  Service: Orthopedics;  Laterality: Left;     Current Meds  Medication Sig   acetaminophen (TYLENOL) 325 MG tablet Take 650 mg by mouth every 6 (six) hours as needed for mild pain.   albuterol (VENTOLIN HFA) 108 (90 Base) MCG/ACT inhaler Inhale 2 puffs into the lungs every 6 (six) hours as needed for wheezing or shortness of breath.   ALPRAZolam (XANAX) 0.5 MG tablet Take 1 tablet (0.5 mg total) by mouth daily as needed.   aspirin EC 81 MG tablet Take 1 tablet (81 mg total) by mouth daily with breakfast.   Cholecalciferol  (DIALYVITE VITAMIN D 5000) 125 MCG (5000 UT) capsule Take 5,000 Units by mouth in the morning.    ferrous sulfate 325 (65 FE) MG tablet Take 1 tablet (325 mg total) by mouth daily with breakfast.   insulin aspart (NOVOLOG) 100 UNIT/ML injection Inject 5 Units into the skin 3 (three) times daily before meals. Special Instructions: If accu-check is greater than 150. Hold for accu-check 150 or below. With Meals   insulin degludec (TRESIBA FLEXTOUCH) 200 UNIT/ML FlexTouch Pen Inject 40 Units into the skin in the morning. Give 30 units Subcutaneous  at Bedtime 8:00 pm (Patient taking differently: Inject 30 Units into the skin in the morning. Give 30 units Subcutaneous  at Bedtime 8:00 pm)   lactulose (CHRONULAC) 10 GM/15ML solution Take 30 mLs (20 g total) by mouth daily.   lactulose (CHRONULAC) 10 GM/15ML solution Take 30 g by mouth daily.   lidocaine (XYLOCAINE) 2 % solution 5 mLs as needed.   loratadine (CLARITIN) 10 MG tablet Take 10 mg by mouth in the  morning.    magnesium oxide (MAG-OX) 400 (240 Mg) MG tablet Take 1 tablet (400 mg total) by mouth daily.   midodrine (PROAMATINE) 10 MG tablet Take 1 tablet (10 mg total) by mouth 3 (three) times daily with meals.   nadolol (CORGARD) 20 MG tablet Take by mouth daily.   OZEMPIC, 1 MG/DOSE, 4 MG/3ML SOPN Inject 1 mg into the skin once a week.   pantoprazole (PROTONIX) 40 MG tablet Take 1 tablet (40 mg total) by mouth daily.   potassium chloride (KLOR-CON) 10 MEQ tablet Take 1 tablet (10 mEq total) by mouth daily. Take While taking Torsemide   pravastatin (PRAVACHOL) 20 MG tablet TAKE 1 TABLET(20 MG) BY MOUTH AT BEDTIME   PULMICORT FLEXHALER 180 MCG/ACT inhaler Inhale into the lungs.   rifaximin (XIFAXAN) 550 MG TABS tablet Take 1 tablet (550 mg total) by mouth 2 (two) times daily.   spironolactone (ALDACTONE) 25 MG tablet Take 0.5 tablets (12.5 mg total) by mouth daily.   torsemide (DEMADEX) 20 MG tablet Take 1 tablet (20 mg total) by mouth daily.    venlafaxine XR (EFFEXOR-XR) 75 MG 24 hr capsule Take 1 capsule (75 mg total) by mouth daily with breakfast.     Family History  Problem Relation Age of Onset   Arthritis Other    Cancer Other    Diabetes Other    CAD Father    Diabetes Mellitus II Father    Liver cancer Father    Hodgkin's lymphoma Father    CAD Brother    Diabetes Mellitus II Brother    ALS Mother    Diabetes Mellitus II Sister    Diabetes Mellitus II Brother    Diabetes Mellitus II Maternal Grandmother    Aneurysm Maternal Grandmother    Cancer Maternal Grandfather    Anesthesia problems Neg Hx    Hypotension Neg Hx    Malignant hyperthermia Neg Hx    Pseudochol deficiency Neg Hx    Colon cancer Neg Hx     Social History   Socioeconomic History   Marital status: Married    Spouse name: Not on file   Number of children: 0   Years of education: college   Highest education level: Not on file  Occupational History   Occupation: Maintenance tech    Employer: [employer]  Tobacco Use   Smoking status: Never   Smokeless tobacco: Never  Vaping Use   Vaping Use: Never used  Substance and Sexual Activity   Alcohol use: Not Currently   Drug use: No   Sexual activity: Not Currently  Other Topics Concern   Not on file  Social History Narrative   Not on file   Social Determinants of Health   Financial Resource Strain: Not on file  Food Insecurity: Not on file  Transportation Needs: Not on file  Physical Activity: Not on file  Stress: Not on file  Social Connections: Not on file       Review of Systems:  As in HPI    Observations/Objective: No distress. Unable to perform physical exam due to telephone encounter. No video available.   Assessment and Plan:    Patient is 60 year old gentleman with marginally compensated end-stage Nash related cirrhosis.    Meld 29.  Recent admission for dehydration/GI bleed as outlined above.  He remains fragile.  Unfortunately, he could not make his  office visit in person today due to lack of transportation.  I told him  a telephone visit today is  not ideal given his comorbidities.  However, it sounds like he  has no new symptoms.  I am glad to hear he has a follow-up appointment down at [facility] next week. Loose ends at this time include need for clearance of his colon and management plan for gallbladder polyp.   Recommendations:  No change in medical regimen at this time.  Continue lactulose, Xifaxan, Corgard, ProAmatine, Protonix, torsemide and spironolactone.  Keep follow-up appointment at the [facility] transplant clinic next week   Consider another attempt at completing a colonoscopy for surveillance purposes.  As discussed, given the difficulties locally, I recommend you have your procedure done down at [facility]  Gallbladder polyp noted on prior imaging here.  This should be addressed with the folks at [facility] as well.  Ideally, we need to see you back in the office for a face-to-face encounter.  We will plan to see you back in 3 months  Consider repeat upper endoscopy in 6 to 12 months from now.   Follow Up Instructions:   see AVS.    I discussed the assessment and treatment plan with the patient. The patient was provided an opportunity to ask questions and all were answered. The patient agreed with the plan and demonstrated an understanding of the instructions.   The patient was advised to call back or seek an in-person evaluation if the symptoms worsen or if the condition fails to improve as anticipated.  I provided 10 minutes of face-to-face time during this MyChart Video encounter.  Annitta Needs, PhD, ANP-BC [facility]

## 2021-10-06 NOTE — Therapy (Signed)
[place], [place], [address] Phone: [phone]   Fax:  [phone]  Physical Therapy Evaluation  Patient Details  Name: [name] MRN: [id number] Date of Birth: [birth year] Referring Provider (PT): Dr. [name]   Encounter Date: 10/06/2021   PT End of Session - 10/06/21 0818     Visit Number 1    Number of Visits 12    Date for PT Re-Evaluation 11/17/21    Authorization Type BCBS 75 VL with 61 left PT/OT/SP, Medicare as secondary.    Authorization - Visit Number 1    Authorization - Number of Visits 61    Progress Note Due on Visit 10    PT Start Time 0801   late to session   PT Stop Time 0824    PT Time Calculation (min) 23 min    Equipment Utilized During Treatment Gait belt    Activity Tolerance Patient limited by fatigue    Behavior During Therapy [facility] for tasks assessed/performed             Past Medical History:  Diagnosis Date   Anemia    Arthritis    CAD (coronary artery disease)    Multivessel disease status post CABG 08/2015   Cirrhosis (HCC)    CKD (chronic kidney disease) stage 3, GFR 30-59 ml/min (HCC)    Diastolic CHF (HCC)    Essential hypertension    Hyperlipidemia    Iron deficiency anemia 07/15/2021   OSA on CPAP    Polyclonal gammopathy    Thrombocytopenia (HCC) 2016   Type 2 diabetes mellitus ([place])     Past Surgical History:  Procedure Laterality Date   APPENDECTOMY     Biceps tendon surgery Right    BIOPSY  11/22/2019   Procedure: BIOPSY;  Surgeon: Daneil Dolin, MD;  [facility];  Service: Endoscopy;;   CARDIAC CATHETERIZATION N/A 08/25/2015   Procedure: Left Heart Cath and Coronary Angiography;  Surgeon: Belva Crome, MD;  Location: [facility];  Service: Cardiovascular;  Laterality: N/A;   COLONOSCOPY WITH PROPOFOL N/A 11/22/2019   Procedure: COLONOSCOPY WITH PROPOFOL;  Surgeon: Daneil Dolin, MD; Four 4-5 mm polyps, findings  suggestive of portal colopathy, congested appearing colonic mucosa diffusely, rectal varices, and adequate right colon prep.  Pathology with tubular adenomas and hyperplastic polyp.  Right colon biopsy with focal active colitis.  Recommendations to repeat colonoscopy in 3 months due to poor prep.   COLONOSCOPY WITH PROPOFOL N/A 03/17/2020   Procedure: COLONOSCOPY WITH PROPOFOL;  Surgeon: Daneil Dolin, MD;  [facility];  Service: Endoscopy;  Laterality: N/A;  8:45am - pt does not need covid test, was + 2/4 <90 days   CORONARY ARTERY BYPASS GRAFT N/A 08/29/2015   Procedure: CORONARY ARTERY BYPASS GRAFTING (CABG);  Surgeon: [name], MD;  Location: [facility];  Service: Open Heart Surgery;  Laterality: N/A;   ESOPHAGEAL BANDING N/A 07/23/2021   Procedure: ESOPHAGEAL BANDING;  Surgeon: Eloise Harman, DO;  [facility];  Service: Endoscopy;  Laterality: N/A;   ESOPHAGOGASTRODUODENOSCOPY (EGD) WITH PROPOFOL N/A 11/22/2019   Procedure: ESOPHAGOGASTRODUODENOSCOPY (EGD) WITH PROPOFOL;  Surgeon: Daneil Dolin, MD; 4 columns of grade 2-3 esophageal varices, portal gastropathy, gastric polyp/abnormal gastric mucosa s/p biopsy.  Pathology with hyperplastic polyp, mild chronic gastritis, negative H. pylori.   ESOPHAGOGASTRODUODENOSCOPY (EGD) WITH PROPOFOL N/A 07/23/2021   Procedure: ESOPHAGOGASTRODUODENOSCOPY (EGD) WITH PROPOFOL;  Surgeon: Hurshel Keys  K, DO;  [facility];  Service: Endoscopy;  Laterality: N/A;   KNEE ARTHROSCOPY Left    TEE WITHOUT CARDIOVERSION N/A 08/29/2015   Procedure: TRANSESOPHAGEAL ECHOCARDIOGRAM (TEE);  Surgeon: [name], MD;  Location: [facility];  Service: Open Heart Surgery;  Laterality: N/A;   TOTAL KNEE ARTHROPLASTY Left 03/09/2017   Procedure: TOTAL KNEE ARTHROPLASTY;  Surgeon: Carole Civil, MD;  Location[facility];  Service: Orthopedics;  Laterality: Left;    There were no vitals filed for this visit.    Subjective  Assessment - 10/06/21 0806     Subjective States that he has had 3 falls and 3 hospital stays in the last 4 months. States he is weak. Reports he usually uses a cane but forgot it today as he was running late. States that he would like to work on his strength.  States he would like to walk with more confidence with and without the cane. Reports the last hospitalization was in September. STates 2 falls he fell out of the bed because the bed was so high he had to use a step to get in and out of the bed. States he now has a lower bed. States that the other falls were related to his blood pressure.    Pertinent History CHF, DB, CABG x4, Kidney disease, hx of hypotension, awaiting liver transplant and RTKA,    Currently in Pain? Yes    Pain Score 6     Pain Orientation Right    Pain Descriptors / Indicators Aching    Pain Type Chronic pain                [facility] PT Assessment - 10/06/21 0001       Assessment   Medical Diagnosis weakness and falls    Prior Therapy HHPT, hospital, rehab      Balance Screen   Has the patient fallen in the past 6 months Yes    How many times? 3    Has the patient had a decrease in activity level because of a fear of falling?  Yes    Is the patient reluctant to leave their home because of a fear of falling?  Yes      [place] Private residence    Living Arrangements Spouse/significant other    Available Help at Discharge Family    Type of [place] to enter    Entrance Stairs-Number of Steps 2-3    Entrance Stairs-Rails Can reach both    [place] One level    [place] - 2 wheels;Cane - single point;Walker - 4 wheels      Prior Function   Level of Independence Independent    Vocation Retired    Leisure fish, hunt. golf      Cognition   Overall Cognitive Status Within Functional Limits for tasks assessed      Transfers   Five time sit to stand comments  22.76   arms crossed across  chest     Ambulation/Gait   Ambulation/Gait Yes    Ambulation/Gait Assistance 5: Supervision    Ambulation Distance (Feet) 176 Feet    Assistive device Straight cane    Gait Pattern Decreased stride length;Right foot flat;Left foot flat;Scissoring;Trunk flexed;Wide base of support;Decreased trunk rotation   scissored movements of legs, cane not synced up with contralateral leg   Ambulation Surface Level;Indoor    Gait velocity reduced  Gait Comments 2MW      Balance   Balance Assessed Yes      Static Standing Balance   Static Standing - Balance Support No upper extremity supported    Static Standing - Level of Assistance 5: Stand by assistance    Static Standing Balance -  Activities  Single Leg Stance - Right Leg;Single Leg Stance - Left Leg    Static Standing - Comment/# of Minutes L 8 seconds, R 14 seconds                        Objective measurements completed on examination: See above findings.       [facility] Adult PT Treatment/Exercise - 10/06/21 0001       Exercises   Exercises Lumbar      Lumbar Exercises: Seated   Sit to Stand 5 reps   slow on the down                    PT Education - 10/06/21 8563     Education Details on current presentation, on HEP, on POC. and using RW.    Person(s) Educated Patient    Methods Explanation    Comprehension Verbalized understanding              PT Short Term Goals - 10/06/21 0825       PT SHORT TERM GOAL #1   Title Patient will be able to perform 5x STS in <18 seconds to demonstrate reduced fall risk    Time 3    Period Weeks    Status New    Target Date 10/27/21      PT SHORT TERM GOAL #2   Title Patient will report at least 25% improvement in overall symptoms and/or function to demonstrate improved functional mobility    Time 3    Period Weeks    Status New    Target Date 10/27/21      PT SHORT TERM GOAL #3   Title Patient will be independent in self management strategies to  improve quality of life and functional outcomes.    Time 3    Period Weeks    Status New    Target Date 10/27/21               PT Long Term Goals - 10/06/21 0826       PT LONG TERM GOAL #1   Title Patient will be able to stand on either leg for at least 20 seconds without UE support to demonstrate improved static balance.    Time 6    Period Weeks    Status New    Target Date 11/17/21      PT LONG TERM GOAL #2   Title Patient will report at least 50% improvement in overall symptoms and/or function to demonstrate improved functional mobility    Time 6    Period Weeks    Status New    Target Date 11/17/21      PT LONG TERM GOAL #3   Title Patient to be able to ambulate 374ft during 2MWT with least restrictive AD in order to improve mobilty and overall community access    Time 6    Period Weeks    Status New    Target Date 11/17/21                    Plan - 10/06/21 0827  Clinical Impression Statement Patient is a 60 y.o. male who presents to physical therapy with complaint of balance and weakness deficits. Patient has been hospitalized 3 times in the last 4 months contributing to current functional presentation. Patient demonstrates decreased strength, balance deficits and gait abnormalities which are negatively impacting patient ability to perform ADLs and functional mobility tasks. Patient will benefit from skilled physical therapy services to address these deficits to improve level of function with ADLs, functional mobility tasks, and reduce risk for falls.    Personal Factors and Comorbidities Comorbidity 1;Comorbidity 3+;Comorbidity 2;Age;Fitness    Comorbidities CHF, DB, CABG x4, Kidney disease, hx of hypotension    Examination-Activity Limitations Locomotion Level;Transfers;Stand;Stairs;Squat;Bathing;Dressing    Examination-Participation Restrictions Meal Prep;Yard Work;Community Activity;Shop;Cleaning    Stability/Clinical Decision Making  Evolving/Moderate complexity    Clinical Decision Making Moderate    Rehab Potential Good    PT Frequency 2x / week    PT Duration 6 weeks    PT Treatment/Interventions ADLs/Self Care Home Management;Cryotherapy;Electrical Stimulation;Moist Heat;Traction;Balance training;Therapeutic exercise;Therapeutic activities;Manual techniques;Stair training;Gait training;DME Instruction;Neuromuscular re-education;Patient/family education;Passive range of motion    PT Next Visit Plan functional strengthening, FOTO, balance static and dynamic, work on endurance    PT Home Exercise Plan STS, using RW    Consulted and Agree with Plan of Care Patient             Patient will benefit from skilled therapeutic intervention in order to improve the following deficits and impairments:  Pain, Decreased strength, Decreased activity tolerance, Decreased balance, Difficulty walking, Decreased mobility, Decreased knowledge of use of DME  Visit Diagnosis: Unsteadiness on feet  Muscle weakness (generalized)     Problem List Patient Active Problem List   Diagnosis Date Noted   Muscle spasm 08/14/2021   CAD (coronary artery disease) 08/14/2021   HLD (hyperlipidemia) 08/14/2021   Chronic idiopathic thrombocytopenia ([place]) 08/14/2021   Increased ammonia level 08/14/2021   Obese 08/14/2021   Fall at home/Back Pain and Ambulatory Dysfunction 08/14/2021   Hypotension due to hypovolemia 08/14/2021   Hypokalemia 19/62/2297   Acute metabolic encephalopathy 98/92/1194   Chronic kidney disease 07/21/2021   Pure hypercholesterolemia 07/21/2021   GI bleed 07/21/2021   Iron deficiency anemia 07/15/2021   Hyperkalemia 03/17/2021   Elevated troponin 03/17/2021   Back pain 03/17/2021   Hypertensive heart and kidney disease with heart failure and with chronic kidney disease stage III ([place]) 12/17/2020   Chronic diastolic heart failure ([place]) 12/17/2020   CKD stage 3 due to type 2 diabetes mellitus ([place]) 12/17/2020    Hyperlipidemia associated with type 2 diabetes mellitus ([place]) 12/17/2020   Anxiety 12/17/2020   Chronic non-seasonal allergic rhinitis 12/17/2020   Hypotension 12/10/2020   Acute renal failure superimposed on stage 3b chronic kidney disease ([place]) 12/10/2020   Acute renal failure superimposed on stage 3b chronic kidney disease, unspecified acute renal failure type ([place]) 12/10/2020   AKI (acute kidney injury) ([place]) 12/09/2020   GERD (gastroesophageal reflux disease) 10/09/2020   Portal hypertension ([place]) 08/26/2020   Pre-transplant evaluation for liver transplant 08/26/2020   Atherosclerotic heart disease of native coronary artery without angina pectoris 07/17/2020   NASH cirrhosis of liver ([place]) 07/17/2020   Anasarca 07/17/2020   Heart failure, unspecified ([place]) 07/17/2020   Morbid obesity ([place]) 07/17/2020   Acute on chronic heart failure with preserved ejection fraction ([place])    Liver failure ([place]) 06/20/2020   Esophageal varices ([place]) 01/09/2020   History of adenomatous polyp of colon 01/09/2020   Elevated AST (SGOT) 08/23/2019  Elevation of level of transaminase and lactic acid dehydrogenase (LDH) 08/23/2019   Diarrhea 07/17/2019   Hypersomnia, persistent 03/12/2019   Macrocytic anemia 01/18/2019   Other pancytopenia ([place]) 01/18/2019   Anemia 01/18/2019   Thrombocytopenia ([place]) 12/30/2018   Polyclonal gammopathy 12/30/2018   S/P total knee replacement, left 03/09/17 11/22/2017   Primary osteoarthritis of left knee 03/09/2017   History of nocturia 03/02/2017   OSA on CPAP 03/02/2016   Hypoxia 03/02/2016   Obstructive sleep apnea (adult) (pediatric) 03/02/2016   Cyst of mediastinum 01/15/2016   S/P CABG x 4 08/29/2015   Obesity, Class III, BMI 40-49.9 (morbid obesity) ([place])    Hyperglycemia    Pain in the chest 08/23/2015   Non-ST elevation MI (NSTEMI) ([place]) 08/23/2015   Diabetes mellitus type 2, uncontrolled 08/23/2015   Essential hypertension 08/23/2015   Hyperlipidemia  08/23/2015   Type 2 diabetes mellitus with hypoglycemia with coma ([place]) 08/23/2015   Myocardial infarction ([place]) 08/23/2015   Type 2 diabetes mellitus with hyperglycemia ([place]) 2016   Class 2 obesity 2016   Hyponatremia 2016   Osteoarthritis, knee 05/24/2012   Knee pain 10/29/2011   Knee stiffness 10/29/2011   S/P right knee arthroscopy 10/26/2011   Medial meniscus, posterior horn derangement 09/07/2011   Lateral meniscus derangement 09/07/2011   OA (osteoarthritis) of knee 09/07/2011   Rotator cuff syndrome of left shoulder 08/19/2011   Right knee meniscal tear 08/19/2011   8:31 AM, 10/06/21 Jerene Pitch, DPT Physical Therapy with [facility]  [phone] office   [place] [address] [place], [place], [address] Phone: [phone]   Fax:  [phone]  Name: LANDIS DOWDY MRN: [id number] Date of Birth: Oct 15, 1961

## 2021-10-07 ENCOUNTER — Encounter: Payer: Self-pay | Admitting: Family Medicine

## 2021-10-07 ENCOUNTER — Encounter (HOSPITAL_COMMUNITY): Payer: Federal, State, Local not specified - PPO | Admitting: Physical Therapy

## 2021-10-07 ENCOUNTER — Telehealth (INDEPENDENT_AMBULATORY_CARE_PROVIDER_SITE_OTHER): Payer: Federal, State, Local not specified - PPO | Admitting: Family Medicine

## 2021-10-07 ENCOUNTER — Telehealth: Payer: Self-pay | Admitting: Neurology

## 2021-10-07 ENCOUNTER — Other Ambulatory Visit (HOSPITAL_COMMUNITY): Payer: Federal, State, Local not specified - PPO

## 2021-10-07 DIAGNOSIS — Z9989 Dependence on other enabling machines and devices: Secondary | ICD-10-CM | POA: Diagnosis not present

## 2021-10-07 DIAGNOSIS — G4733 Obstructive sleep apnea (adult) (pediatric): Secondary | ICD-10-CM | POA: Diagnosis not present

## 2021-10-07 DIAGNOSIS — I25810 Atherosclerosis of coronary artery bypass graft(s) without angina pectoris: Secondary | ICD-10-CM

## 2021-10-07 NOTE — Telephone Encounter (Signed)
Change made for the patient and he was called to advise to get online for visit.

## 2021-10-07 NOTE — Telephone Encounter (Signed)
Pt is unable to make his initial CPAP f/u today but is unable to come.The schedule for Dr Brett Fairy along with Jinny Blossom, NP, Amy, NP and Janett Billow, NP has been checked.  No one has anything available.  Pt stated he is willing and able to do a my chart vv if that is an option for him. Please call.

## 2021-10-08 ENCOUNTER — Telehealth: Payer: Self-pay | Admitting: *Deleted

## 2021-10-08 NOTE — Telephone Encounter (Signed)
Pt consented to a virtual visit on 10/06/2021.

## 2021-10-08 NOTE — Telephone Encounter (Signed)
[name], you are scheduled for a virtual visit with your provider today.  Just as we do with appointments in the office, we must obtain your consent to participate.  Your consent will be active for this visit and any virtual visit you may have with one of our providers in the next 365 days.  If you have a MyChart account, I can also send a copy of this consent to you electronically.  All virtual visits are billed to your insurance company just like a traditional visit in the office.  As this is a virtual visit, video technology does not allow for your provider to perform a traditional examination.  This may limit your provider's ability to fully assess your condition.  If your provider identifies any concerns that need to be evaluated in person or the need to arrange testing such as labs, EKG, etc, we will make arrangements to do so.  Although advances in technology are sophisticated, we cannot ensure that it will always work on either your end or our end.  If the connection with a video visit is poor, we may have to switch to a telephone visit.  With either a video or telephone visit, we are not always able to ensure that we have a secure connection.   I need to obtain your verbal consent now.   Are you willing to proceed with your visit today?

## 2021-10-09 ENCOUNTER — Ambulatory Visit (HOSPITAL_COMMUNITY): Payer: Federal, State, Local not specified - PPO | Admitting: Physician Assistant

## 2021-10-09 IMAGING — DX DG CHEST 2V
2 series · 2 of 2 positions shown · non-contrast
Comparison: 02/01/2020

CLINICAL DATA: Cough. COVID positive.

EXAM:
CHEST - 2 VIEW

[chest pa]
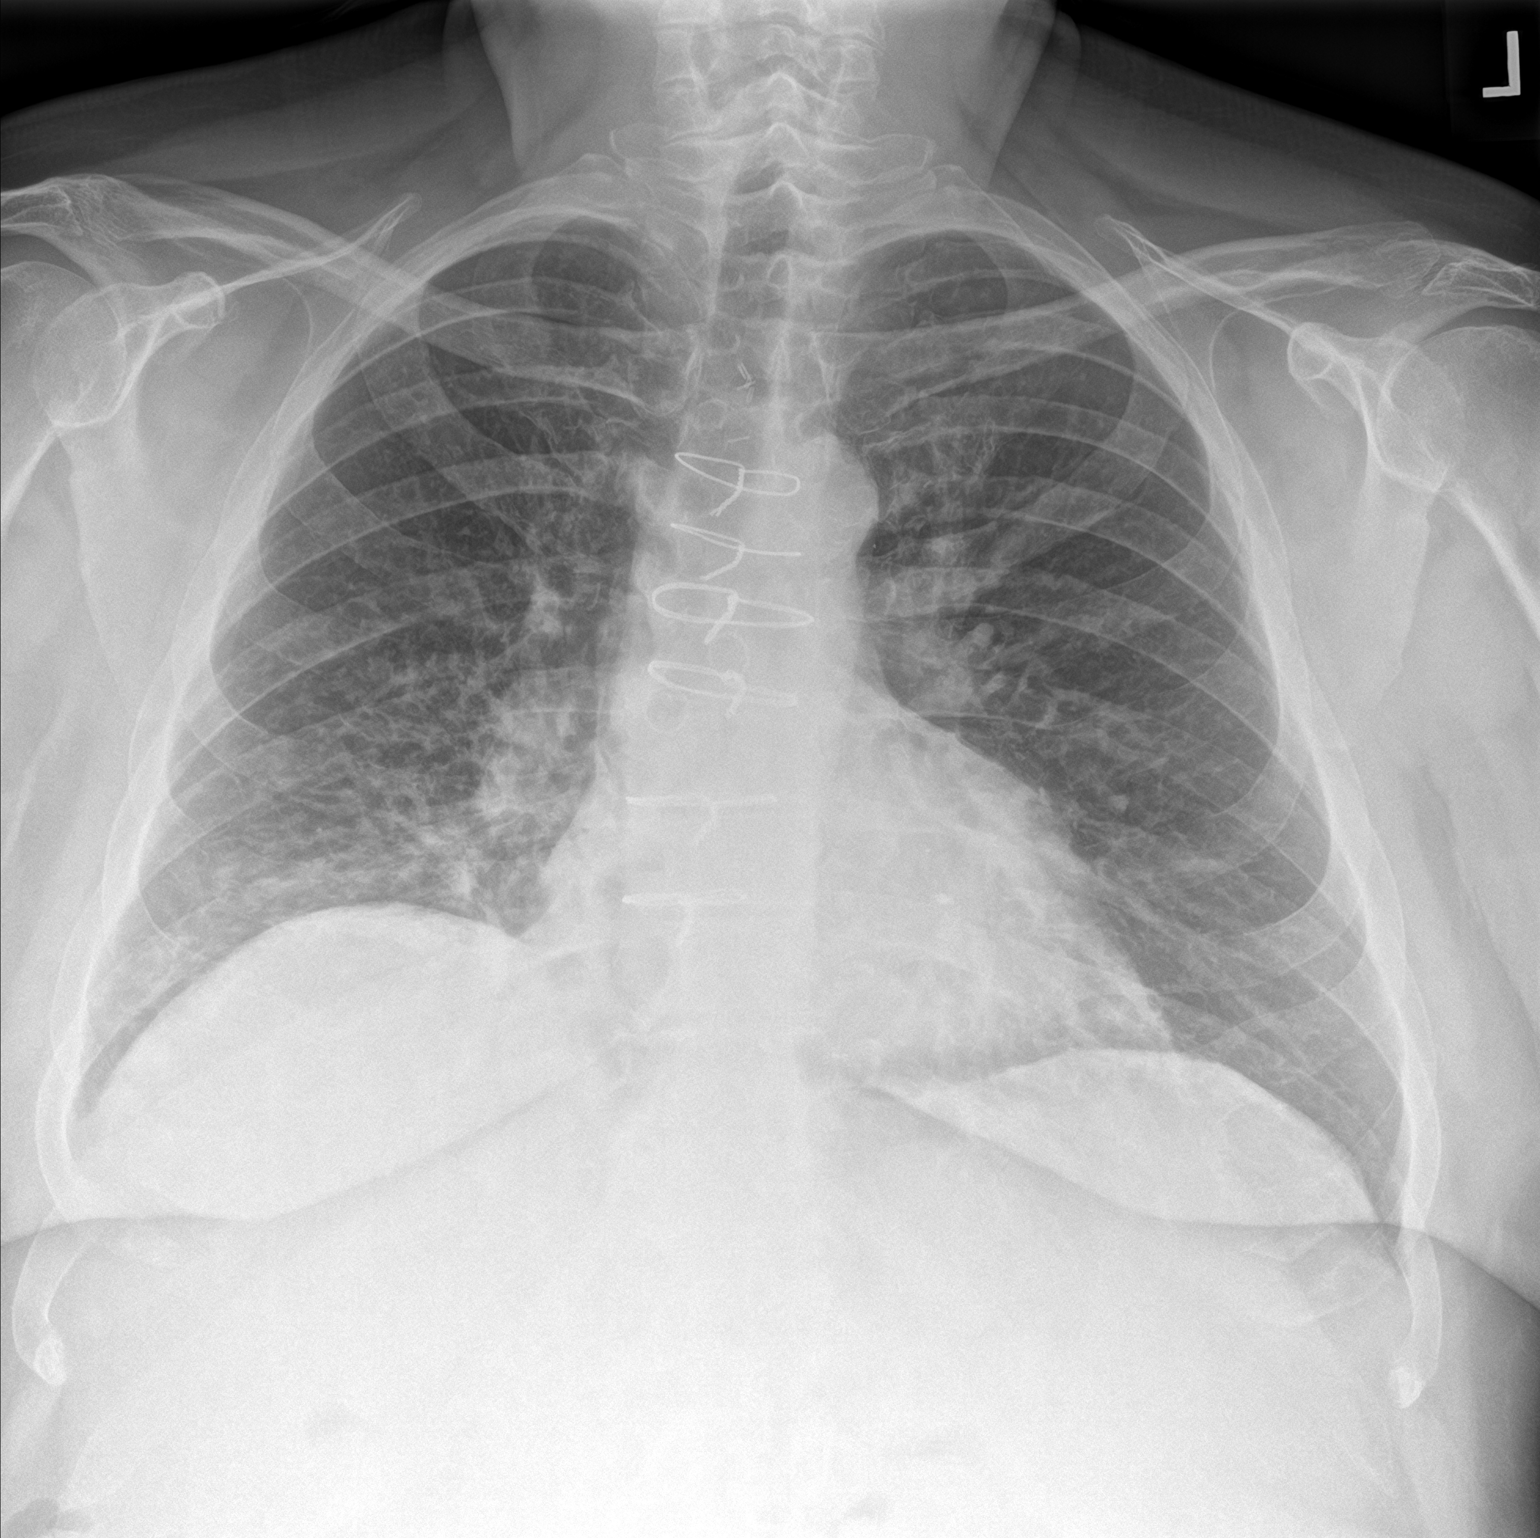

[chest lat]
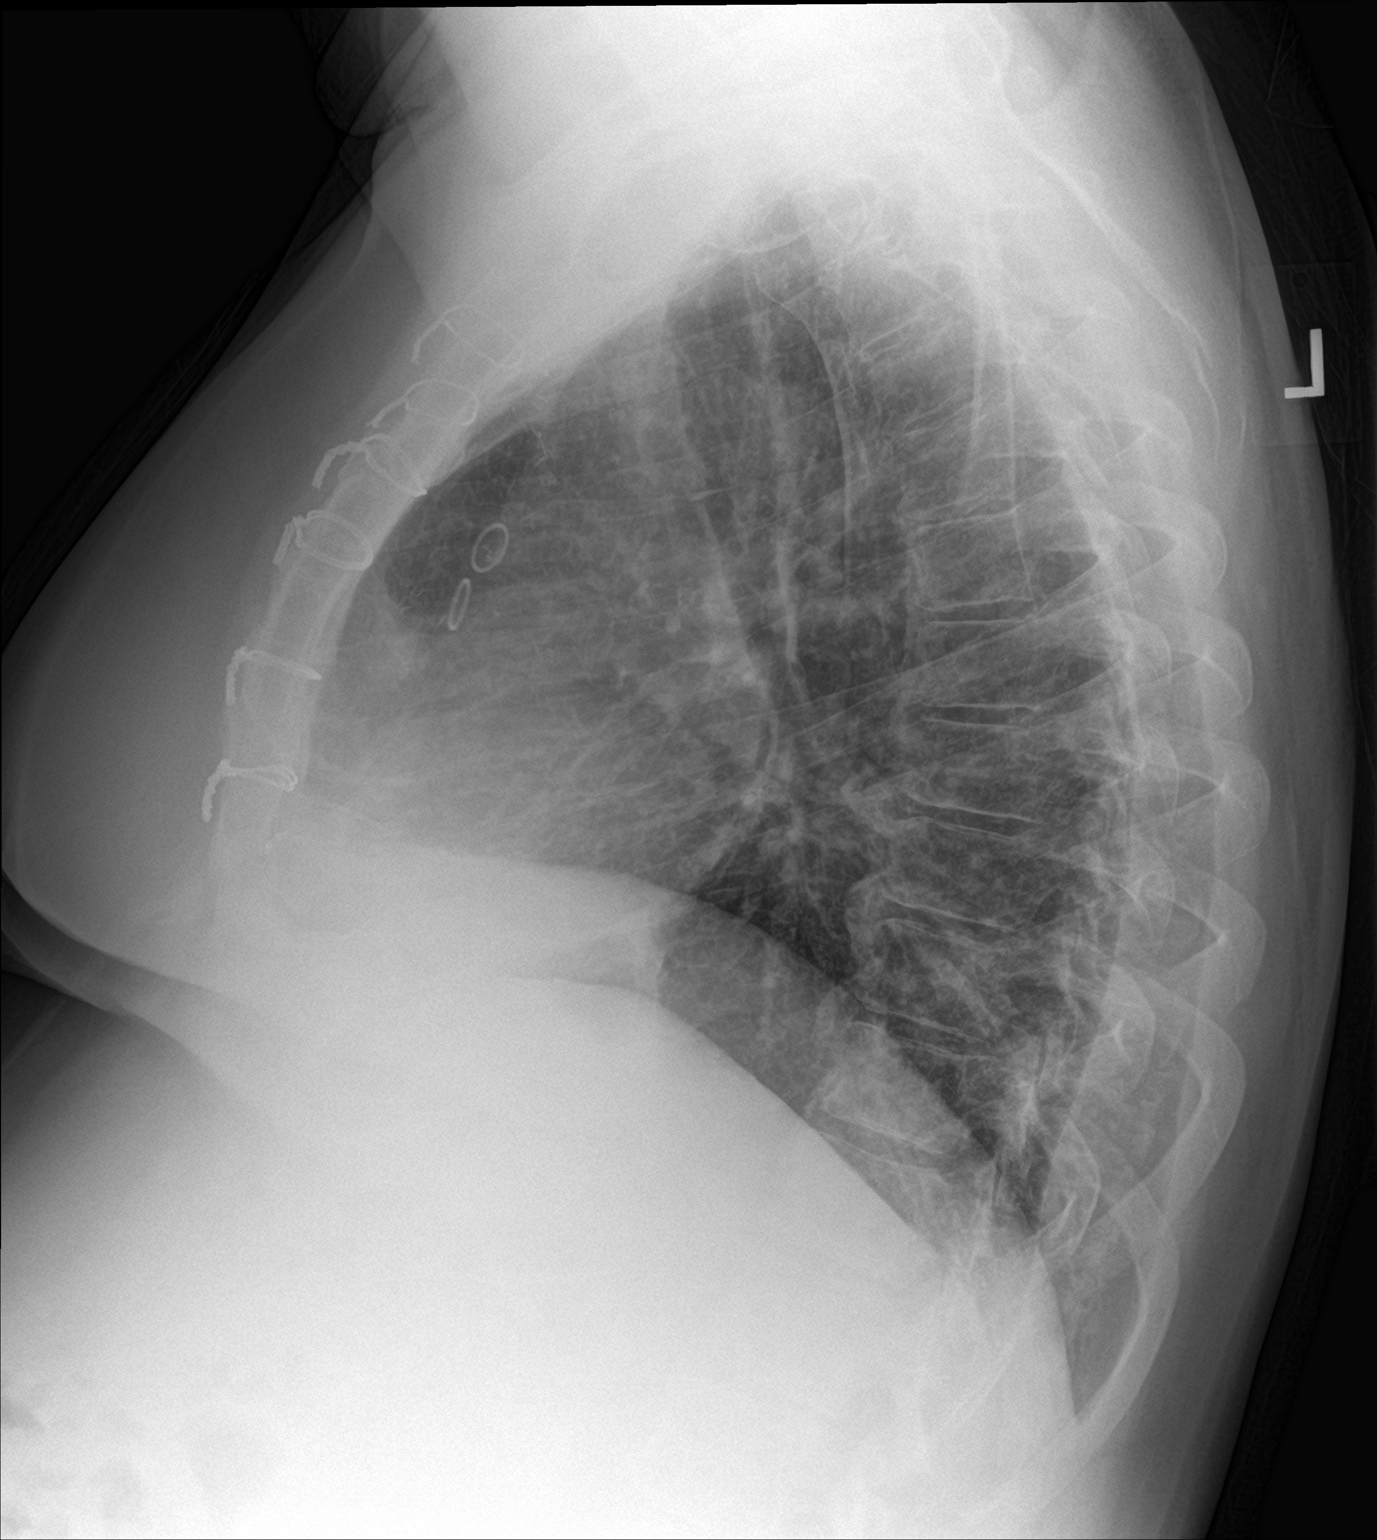

[2 of 2 positions shown; findings below may reference images not displayed]

FINDINGS: Previous median sternotomy and CABG procedure. Normal heart size.
Persistent but improved airspace disease within the right lower
lobe. No new pulmonary opacities. No pleural effusion or edema.
IMPRESSION: Improving right lower lobe pneumonia.

## 2021-10-12 ENCOUNTER — Other Ambulatory Visit: Payer: Self-pay

## 2021-10-12 ENCOUNTER — Ambulatory Visit (HOSPITAL_COMMUNITY): Payer: Federal, State, Local not specified - PPO | Admitting: Physical Therapy

## 2021-10-12 DIAGNOSIS — R2681 Unsteadiness on feet: Secondary | ICD-10-CM | POA: Diagnosis not present

## 2021-10-12 DIAGNOSIS — M79671 Pain in right foot: Secondary | ICD-10-CM | POA: Diagnosis not present

## 2021-10-12 DIAGNOSIS — L110 Acquired keratosis follicularis: Secondary | ICD-10-CM | POA: Diagnosis not present

## 2021-10-12 DIAGNOSIS — M6281 Muscle weakness (generalized): Secondary | ICD-10-CM | POA: Diagnosis not present

## 2021-10-12 DIAGNOSIS — E1151 Type 2 diabetes mellitus with diabetic peripheral angiopathy without gangrene: Secondary | ICD-10-CM | POA: Diagnosis not present

## 2021-10-12 DIAGNOSIS — I739 Peripheral vascular disease, unspecified: Secondary | ICD-10-CM | POA: Diagnosis not present

## 2021-10-12 NOTE — Patient Instructions (Signed)
Functional Quadriceps: Sit to Stand    Sit on edge of chair, feet flat on floor. Stand upright, extending knees fully. Repeat _10___ times per set. Do _2___ sessions per day.

## 2021-10-12 NOTE — Therapy (Signed)
[name], [place], [address] Phone: [phone]   Fax:  [phone]  Physical Therapy Treatment  Patient Details  Name: [name] MRN: [id number] Date of Birth: [birth year] Referring Provider (PT): Dr. [name]   Encounter Date: 10/12/2021   PT End of Session - 10/12/21 1125     Visit Number 2    Number of Visits 12    Date for PT Re-Evaluation 11/17/21    Authorization Type BCBS 75 VL with 61 left PT/OT/SP, Medicare as secondary.    Authorization - Visit Number 2    Authorization - Number of Visits 61    Progress Note Due on Visit 10    PT Start Time 7342    PT Stop Time 1130    PT Time Calculation (min) 39 min    Equipment Utilized During Treatment Gait belt    Activity Tolerance Patient limited by fatigue    Behavior During Therapy WFL for tasks assessed/performed             Past Medical History:  Diagnosis Date   Anemia    Arthritis    CAD (coronary artery disease)    Multivessel disease status post CABG 08/2015   Cirrhosis (HCC)    CKD (chronic kidney disease) stage 3, GFR 30-59 ml/min (HCC)    Diastolic CHF (HCC)    Essential hypertension    Hyperlipidemia    Iron deficiency anemia 07/15/2021   OSA on CPAP    Polyclonal gammopathy    Thrombocytopenia (HCC) 2016   Type 2 diabetes mellitus ([place])     Past Surgical History:  Procedure Laterality Date   APPENDECTOMY     Biceps tendon surgery Right    BIOPSY  11/22/2019   Procedure: BIOPSY;  Surgeon: Daneil Dolin, MD;  [facility];  Service: Endoscopy;;   CARDIAC CATHETERIZATION N/A 08/25/2015   Procedure: Left Heart Cath and Coronary Angiography;  Surgeon: Belva Crome, MD;  Location: [facility];  Service: Cardiovascular;  Laterality: N/A;   COLONOSCOPY WITH PROPOFOL N/A 11/22/2019   Procedure: COLONOSCOPY WITH PROPOFOL;  Surgeon: Daneil Dolin, MD; Four 4-5 mm polyps, findings suggestive of portal  colopathy, congested appearing colonic mucosa diffusely, rectal varices, and adequate right colon prep.  Pathology with tubular adenomas and hyperplastic polyp.  Right colon biopsy with focal active colitis.  Recommendations to repeat colonoscopy in 3 months due to poor prep.   COLONOSCOPY WITH PROPOFOL N/A 03/17/2020   Procedure: COLONOSCOPY WITH PROPOFOL;  Surgeon: Daneil Dolin, MD;  [facility];  Service: Endoscopy;  Laterality: N/A;  8:45am - pt does not need covid test, was + 2/4 <90 days   CORONARY ARTERY BYPASS GRAFT N/A 08/29/2015   Procedure: CORONARY ARTERY BYPASS GRAFTING (CABG);  Surgeon: [name], MD;  Location: [facility];  Service: Open Heart Surgery;  Laterality: N/A;   ESOPHAGEAL BANDING N/A 07/23/2021   Procedure: ESOPHAGEAL BANDING;  Surgeon: Eloise Harman, DO;  [facility];  Service: Endoscopy;  Laterality: N/A;   ESOPHAGOGASTRODUODENOSCOPY (EGD) WITH PROPOFOL N/A 11/22/2019   Procedure: ESOPHAGOGASTRODUODENOSCOPY (EGD) WITH PROPOFOL;  Surgeon: Daneil Dolin, MD; 4 columns of grade 2-3 esophageal varices, portal gastropathy, gastric polyp/abnormal gastric mucosa s/p biopsy.  Pathology with hyperplastic polyp, mild chronic gastritis, negative H. pylori.   ESOPHAGOGASTRODUODENOSCOPY (EGD) WITH PROPOFOL N/A 07/23/2021   Procedure: ESOPHAGOGASTRODUODENOSCOPY (EGD) WITH PROPOFOL;  Surgeon: Eloise Harman, DO;  [facility];  Service: Endoscopy;  Laterality: N/A;   KNEE ARTHROSCOPY Left    TEE WITHOUT CARDIOVERSION N/A 08/29/2015   Procedure: TRANSESOPHAGEAL ECHOCARDIOGRAM (TEE);  Surgeon: [name], MD;  Location: [facility];  Service: Open Heart Surgery;  Laterality: N/A;   TOTAL KNEE ARTHROPLASTY Left 03/09/2017   Procedure: TOTAL KNEE ARTHROPLASTY;  Surgeon: Carole Civil, MD;  Location: [facility];  Service: Orthopedics;  Laterality: Left;    There were no vitals filed for this visit.   Subjective Assessment - 10/12/21 1054      Subjective Pt states his Rt knee always hurts; states he's waiting on a liver transplant before he can get his Rt knee replaced.  STates he got his Lt knee replaced back in 2017. Pt states he's doing some exercises given to him while in the hospital but was not given any to do at evaluation here.    Currently in Pain? Yes    Pain Score 6     Pain Location Knee    Pain Orientation Right    Pain Descriptors / Indicators Aching                               [facility] Adult PT Treatment/Exercise - 10/12/21 0001       Lumbar Exercises: Standing   Heel Raises 15 reps    Forward Lunge 10 reps    Forward Lunge Limitations no UE onto 4" step    Other Standing Lumbar Exercises hip abduction, hip extension 10X each    Other Standing Lumbar Exercises tandem stance 30" each LE lead      Lumbar Exercises: Seated   Sit to Stand 10 reps    Sit to Stand Limitations no UE                     PT Education - 10/12/21 1128     Education Details review of goals and POC moving forward.  initiated HEP    Person(s) Educated Patient    Methods Explanation;Demonstration    Comprehension Verbalized understanding;Returned demonstration;Verbal cues required;Tactile cues required;Need further instruction              PT Short Term Goals - 10/12/21 1126       PT SHORT TERM GOAL #1   Title Patient will be able to perform 5x STS in <18 seconds to demonstrate reduced fall risk    Time 3    Period Weeks    Status On-going    Target Date 10/27/21      PT SHORT TERM GOAL #2   Title Patient will report at least 25% improvement in overall symptoms and/or function to demonstrate improved functional mobility    Time 3    Period Weeks    Status On-going    Target Date 10/27/21      PT SHORT TERM GOAL #3   Title Patient will be independent in self management strategies to improve quality of life and functional outcomes.    Time 3    Period Weeks    Status On-going     Target Date 10/27/21               PT Long Term Goals - 10/12/21 1127       PT LONG TERM GOAL #1   Title Patient will be able to stand on either leg for at least 20 seconds without UE support to  demonstrate improved static balance.    Time 6    Period Weeks    Status On-going      PT LONG TERM GOAL #2   Title Patient will report at least 50% improvement in overall symptoms and/or function to demonstrate improved functional mobility    Time 6    Period Weeks    Status On-going      PT LONG TERM GOAL #3   Title Patient to be able to ambulate 332ft during 2MWT with least restrictive AD in order to improve mobilty and overall community access    Time 6    Period Weeks    Status On-going                   Plan - 10/12/21 1131     Clinical Impression Statement Reviewed goals and POC moving forward.  Began standing strengthening and stability exercises.  PT with cues for form and reducing speed/control of movement.  Several LOB but able to self correct.  2 seated rest breaks needed during session today.  Pt has lost 80 pounds in preparation for liver transplant and is feeling better however weak and losing his balance.  Pt given 4 exercises to add to HEP in standing.    Personal Factors and Comorbidities Comorbidity 1;Comorbidity 3+;Comorbidity 2;Age;Fitness    Comorbidities CHF, DB, CABG x4, Kidney disease, hx of hypotension    Examination-Activity Limitations Locomotion Level;Transfers;Stand;Stairs;Squat;Bathing;Dressing    Examination-Participation Restrictions Meal Prep;Yard Work;Community Activity;Shop;Cleaning    Stability/Clinical Decision Making Evolving/Moderate complexity    Rehab Potential Good    PT Frequency 2x / week    PT Duration 6 weeks    PT Treatment/Interventions ADLs/Self Care Home Management;Cryotherapy;Electrical Stimulation;Moist Heat;Traction;Balance training;Therapeutic exercise;Therapeutic activities;Manual techniques;Stair training;Gait  training;DME Instruction;Neuromuscular re-education;Patient/family education;Passive range of motion    PT Next Visit Plan Increase functional strengthening and balance.  complete FOTO next session.    PT Home Exercise Plan STS no UE, hip abduction, hip extension, heelraises    Consulted and Agree with Plan of Care Patient             Patient will benefit from skilled therapeutic intervention in order to improve the following deficits and impairments:  Pain, Decreased strength, Decreased activity tolerance, Decreased balance, Difficulty walking, Decreased mobility, Decreased knowledge of use of DME  Visit Diagnosis: Unsteadiness on feet  Muscle weakness (generalized)     Problem List Patient Active Problem List   Diagnosis Date Noted   Muscle spasm 08/14/2021   CAD (coronary artery disease) 08/14/2021   HLD (hyperlipidemia) 08/14/2021   Chronic idiopathic thrombocytopenia ([place]) 08/14/2021   Increased ammonia level 08/14/2021   Obese 08/14/2021   Fall at home/Back Pain and Ambulatory Dysfunction 08/14/2021   Hypotension due to hypovolemia 08/14/2021   Hypokalemia 95/08/3266   Acute metabolic encephalopathy 12/45/8099   Chronic kidney disease 07/21/2021   Pure hypercholesterolemia 07/21/2021   GI bleed 07/21/2021   Iron deficiency anemia 07/15/2021   Hyperkalemia 03/17/2021   Elevated troponin 03/17/2021   Back pain 03/17/2021   Hypertensive heart and kidney disease with heart failure and with chronic kidney disease stage III ([place]) 12/17/2020   Chronic diastolic heart failure ([place]) 12/17/2020   CKD stage 3 due to type 2 diabetes mellitus ([place]) 12/17/2020   Hyperlipidemia associated with type 2 diabetes mellitus ([place]) 12/17/2020   Anxiety 12/17/2020   Chronic non-seasonal allergic rhinitis 12/17/2020   Hypotension 12/10/2020   Acute renal failure superimposed on stage 3b chronic kidney disease ([place])  12/10/2020   Acute renal failure superimposed on stage 3b chronic kidney  disease, unspecified acute renal failure type ([place]) 12/10/2020   AKI (acute kidney injury) ([place]) 12/09/2020   GERD (gastroesophageal reflux disease) 10/09/2020   Portal hypertension ([place]) 08/26/2020   Pre-transplant evaluation for liver transplant 08/26/2020   Atherosclerotic heart disease of native coronary artery without angina pectoris 07/17/2020   NASH cirrhosis of liver ([place]) 07/17/2020   Anasarca 07/17/2020   Heart failure, unspecified ([place]) 07/17/2020   Morbid obesity ([place]) 07/17/2020   Acute on chronic heart failure with preserved ejection fraction (HCC)    Liver failure ([place]) 06/20/2020   Esophageal varices ([place]) 01/09/2020   History of adenomatous polyp of colon 01/09/2020   Elevated AST (SGOT) 08/23/2019   Elevation of level of transaminase and lactic acid dehydrogenase (LDH) 08/23/2019   Diarrhea 07/17/2019   Hypersomnia, persistent 03/12/2019   Macrocytic anemia 01/18/2019   Other pancytopenia ([place]) 01/18/2019   Anemia 01/18/2019   Thrombocytopenia ([place]) 12/30/2018   Polyclonal gammopathy 12/30/2018   S/P total knee replacement, left 03/09/17 11/22/2017   Primary osteoarthritis of left knee 03/09/2017   History of nocturia 03/02/2017   OSA on CPAP 03/02/2016   Hypoxia 03/02/2016   Obstructive sleep apnea (adult) (pediatric) 03/02/2016   Cyst of mediastinum 01/15/2016   S/P CABG x 4 08/29/2015   Obesity, Class III, BMI 40-49.9 (morbid obesity) ([place])    Hyperglycemia    Pain in the chest 08/23/2015   Non-ST elevation MI (NSTEMI) ([place]) 08/23/2015   Diabetes mellitus type 2, uncontrolled 08/23/2015   Essential hypertension 08/23/2015   Hyperlipidemia 08/23/2015   Type 2 diabetes mellitus with hypoglycemia with coma ([place]) 08/23/2015   Myocardial infarction ([place]) 08/23/2015   Type 2 diabetes mellitus with hyperglycemia ([place]) 2016   Class 2 obesity 2016   Hyponatremia 2016   Osteoarthritis, knee 05/24/2012   Knee pain 10/29/2011   Knee stiffness 10/29/2011   S/P  right knee arthroscopy 10/26/2011   Medial meniscus, posterior horn derangement 09/07/2011   Lateral meniscus derangement 09/07/2011   OA (osteoarthritis) of knee 09/07/2011   Rotator cuff syndrome of left shoulder 08/19/2011   Right knee meniscal tear 08/19/2011   Teena Irani, PTA/CLT, WTA [phone]  Teena Irani, PTA 10/12/2021, 11:33 AM  [facility] [address] [place], [place], [address] Phone: [phone]   Fax:  [phone]  Name: [name] MRN: [id number] Date of Birth: Oct 18, 1961

## 2021-10-13 ENCOUNTER — Encounter: Payer: Self-pay | Admitting: Family Medicine

## 2021-10-13 ENCOUNTER — Ambulatory Visit (INDEPENDENT_AMBULATORY_CARE_PROVIDER_SITE_OTHER): Payer: Federal, State, Local not specified - PPO | Admitting: Family Medicine

## 2021-10-13 VITALS — BP 118/80 | HR 73 | Ht 70.5 in | Wt 254.8 lb

## 2021-10-13 DIAGNOSIS — K7460 Unspecified cirrhosis of liver: Secondary | ICD-10-CM

## 2021-10-13 DIAGNOSIS — R188 Other ascites: Secondary | ICD-10-CM

## 2021-10-13 DIAGNOSIS — I5032 Chronic diastolic (congestive) heart failure: Secondary | ICD-10-CM | POA: Diagnosis not present

## 2021-10-13 DIAGNOSIS — N1832 Chronic kidney disease, stage 3b: Secondary | ICD-10-CM

## 2021-10-13 DIAGNOSIS — I350 Nonrheumatic aortic (valve) stenosis: Secondary | ICD-10-CM | POA: Diagnosis not present

## 2021-10-13 DIAGNOSIS — Z9989 Dependence on other enabling machines and devices: Secondary | ICD-10-CM

## 2021-10-13 DIAGNOSIS — I2510 Atherosclerotic heart disease of native coronary artery without angina pectoris: Secondary | ICD-10-CM

## 2021-10-13 DIAGNOSIS — G4733 Obstructive sleep apnea (adult) (pediatric): Secondary | ICD-10-CM

## 2021-10-13 NOTE — Patient Instructions (Addendum)
Medication Instructions:   Your physician recommends that you continue on your current medications as directed. Please refer to the Current Medication list given to you today.  Labwork:  none  Testing/Procedures:  none  Follow-Up:  Your physician recommends that you schedule a follow-up appointment in: 3 months.  Any Other Special Instructions Will Be Listed Below (If Applicable).  If you need a refill on your cardiac medications before your next appointment, please call your pharmacy. 

## 2021-10-13 NOTE — Progress Notes (Signed)
Cardiology Office Note  Date: 10/13/2021   ID: [name], DOB 1961-08-13, MRN [id number]  PCP:  [name], MD  Cardiologist:  Rozann Lesches, MD Electrophysiologist:  None   Chief Complaint: 1 month follow-up  History of Present Illness: [name] is a 60 y.o. male with a history of CAD/NSTEMI/CABG 2016, aortic stenosis, adrenal insufficiency, polyclonal gammopathy, CKD, chronic diastolic heart failure, HTN, HLD, IDA, OSA, thrombocytopenia, NASH/cirrhosis/esophageal varices/portal hypertension, DM2.  Admission to [facility] in August 2022 for GI bleeding and symptomatic anemia.  Diagnosis grade 2 esophageal varices.  He was to continue PPI p.o. twice daily along with Carafate.  Later admitted August 30 through 08/25/2021 for hypotension with electrolyte disturbances and dehydration.  Torsemide was reduced to 20 mg daily along with decreasing spironolactone to 12.5 mg daily.  He was continuing nadolol 20 mg daily, midodrine 10 mg 3 times daily.  Has been admitted to SNF for outpatient physical therapy.  On recent follow-up with Bernerd Pho on 10/01/2021.  He denied any chest pain, palpitation, lower extremity edema he was wearing compression stockings.  He had recently lost 30 pounds.  He is here today after having been seen 11 days ago.  He states he is wondering why he is here today.  He states he initially had an appointment with Dr. Domenic Polite on November 18 but this was rescheduled.  He denies any changes from last visit with [name] on October 01, 2021.  The only question he has today is why his hands are recently feeling a little cold in the evening.  He states usually when he puts his hands is under the covers at night during sleep his hands warm up.  He has good radial pulses and good cap refill.  Hands are warm.  States he has some occasional bruising on his arm if he hits something.  He states he knows it may be related to his liver.  He denies any  recent bleeding in stool or urine.  He does have black stools secondary to iron supplementation.  He has been receiving iron infusions also recently.  His blood pressure is well controlled today.  He denies any anginal symptoms.  Denies any orthostatic symptoms or lightheadedness, dizziness, presyncope or syncopal episodes.  Denies any CVA or TIA-like symptoms.  Denies any claudication-like symptoms.  No DVT or PE-like symptoms, or lower extremity edema.  He states he is going to do for evaluation for possible liver transplant.  He states his sister is going with him and may be a donor.  Past Medical History:  Diagnosis Date   Anemia    Arthritis    CAD (coronary artery disease)    Multivessel disease status post CABG 08/2015   Cirrhosis (HCC)    CKD (chronic kidney disease) stage 3, GFR 30-59 ml/min (HCC)    Diastolic CHF (HCC)    Essential hypertension    Hyperlipidemia    Iron deficiency anemia 07/15/2021   OSA on CPAP    Polyclonal gammopathy    Thrombocytopenia (HCC) 2016   Type 2 diabetes mellitus ([place])     Past Surgical History:  Procedure Laterality Date   APPENDECTOMY     Biceps tendon surgery Right    BIOPSY  11/22/2019   Procedure: BIOPSY;  Surgeon: Daneil Dolin, MD;  [facility];  Service: Endoscopy;;   CARDIAC CATHETERIZATION N/A 08/25/2015   Procedure: Left Heart Cath and Coronary Angiography;  Surgeon: Belva Crome, MD;  Location:  [facility];  Service: Cardiovascular;  Laterality: N/A;   COLONOSCOPY WITH PROPOFOL N/A 11/22/2019   Procedure: COLONOSCOPY WITH PROPOFOL;  Surgeon: Daneil Dolin, MD; Four 4-5 mm polyps, findings suggestive of portal colopathy, congested appearing colonic mucosa diffusely, rectal varices, and adequate right colon prep.  Pathology with tubular adenomas and hyperplastic polyp.  Right colon biopsy with focal active colitis.  Recommendations to repeat colonoscopy in 3 months due to poor prep.   COLONOSCOPY WITH PROPOFOL N/A  03/17/2020   Procedure: COLONOSCOPY WITH PROPOFOL;  Surgeon: Daneil Dolin, MD;  [facility];  Service: Endoscopy;  Laterality: N/A;  8:45am - pt does not need covid test, was + 2/4 <90 days   CORONARY ARTERY BYPASS GRAFT N/A 08/29/2015   Procedure: CORONARY ARTERY BYPASS GRAFTING (CABG);  Surgeon: [name], MD;  Location: [facility];  Service: Open Heart Surgery;  Laterality: N/A;   ESOPHAGEAL BANDING N/A 07/23/2021   Procedure: ESOPHAGEAL BANDING;  Surgeon: Eloise Harman, DO;  [facility];  Service: Endoscopy;  Laterality: N/A;   ESOPHAGOGASTRODUODENOSCOPY (EGD) WITH PROPOFOL N/A 11/22/2019   Procedure: ESOPHAGOGASTRODUODENOSCOPY (EGD) WITH PROPOFOL;  Surgeon: Daneil Dolin, MD; 4 columns of grade 2-3 esophageal varices, portal gastropathy, gastric polyp/abnormal gastric mucosa s/p biopsy.  Pathology with hyperplastic polyp, mild chronic gastritis, negative H. pylori.   ESOPHAGOGASTRODUODENOSCOPY (EGD) WITH PROPOFOL N/A 07/23/2021   Procedure: ESOPHAGOGASTRODUODENOSCOPY (EGD) WITH PROPOFOL;  Surgeon: Eloise Harman, DO;  [facility];  Service: Endoscopy;  Laterality: N/A;   KNEE ARTHROSCOPY Left    TEE WITHOUT CARDIOVERSION N/A 08/29/2015   Procedure: TRANSESOPHAGEAL ECHOCARDIOGRAM (TEE);  Surgeon: [name], MD;  Location: [facility];  Service: Open Heart Surgery;  Laterality: N/A;   TOTAL KNEE ARTHROPLASTY Left 03/09/2017   Procedure: TOTAL KNEE ARTHROPLASTY;  Surgeon: Carole Civil, MD;  Location: [facility];  Service: Orthopedics;  Laterality: Left;    Current Outpatient Medications  Medication Sig Dispense Refill   acetaminophen (TYLENOL) 325 MG tablet Take 650 mg by mouth every 6 (six) hours as needed for mild pain.     albuterol (VENTOLIN HFA) 108 (90 Base) MCG/ACT inhaler Inhale 2 puffs into the lungs every 6 (six) hours as needed for wheezing or shortness of breath. 18 g 0   ALPRAZolam (XANAX) 0.5 MG tablet Take 1 tablet (0.5 mg  total) by mouth daily as needed. 10 tablet 0   aspirin EC 81 MG tablet Take 1 tablet (81 mg total) by mouth daily with breakfast. 30 tablet 11   Cholecalciferol (DIALYVITE VITAMIN D 5000) 125 MCG (5000 UT) capsule Take 5,000 Units by mouth in the morning.      ferrous sulfate 325 (65 FE) MG tablet Take 1 tablet (325 mg total) by mouth daily with breakfast. 30 tablet 3   insulin aspart (NOVOLOG) 100 UNIT/ML injection Inject 5 Units into the skin 3 (three) times daily before meals. Special Instructions: If accu-check is greater than 150. Hold for accu-check 150 or below. With Meals 10 mL 0   insulin degludec (TRESIBA FLEXTOUCH) 200 UNIT/ML FlexTouch Pen Inject 40 Units into the skin in the morning. Give 30 units Subcutaneous  at Bedtime 8:00 pm (Patient taking differently: Inject 30 Units into the skin in the morning. Give 30 units Subcutaneous  at Bedtime 8:00 pm) 15 mL 0   lactulose (CHRONULAC) 10 GM/15ML solution Take 30 mLs (20 g total) by mouth daily. 900 mL 0   lidocaine (XYLOCAINE) 2 %  solution 5 mLs as needed.     loratadine (CLARITIN) 10 MG tablet Take 10 mg by mouth in the morning.      magnesium oxide (MAG-OX) 400 (240 Mg) MG tablet Take 1 tablet (400 mg total) by mouth daily. 30 tablet 2   midodrine (PROAMATINE) 10 MG tablet Take 1 tablet (10 mg total) by mouth 3 (three) times daily with meals. 90 tablet 5   nadolol (CORGARD) 20 MG tablet Take by mouth daily.     OZEMPIC, 1 MG/DOSE, 4 MG/3ML SOPN Inject 1 mg into the skin once a week. 3 mL 0   pantoprazole (PROTONIX) 40 MG tablet Take 1 tablet (40 mg total) by mouth daily. 30 tablet 5   potassium chloride (KLOR-CON) 10 MEQ tablet Take 1 tablet (10 mEq total) by mouth daily. Take While taking Torsemide 30 tablet 2   pravastatin (PRAVACHOL) 20 MG tablet TAKE 1 TABLET(20 MG) BY MOUTH AT BEDTIME 30 tablet 0   PULMICORT FLEXHALER 180 MCG/ACT inhaler Inhale into the lungs.     rifaximin (XIFAXAN) 550 MG TABS tablet Take 1 tablet (550 mg total)  by mouth 2 (two) times daily. 60 tablet 5   spironolactone (ALDACTONE) 25 MG tablet Take 0.5 tablets (12.5 mg total) by mouth daily. 30 tablet 3   torsemide (DEMADEX) 20 MG tablet Take 1 tablet (20 mg total) by mouth daily. 30 tablet 1   venlafaxine XR (EFFEXOR-XR) 75 MG 24 hr capsule Take 1 capsule (75 mg total) by mouth daily with breakfast. 30 capsule 3   No current facility-administered medications for this visit.   Allergies:  Fluticasone   Social History: The patient  reports that he has never smoked. He has never used smokeless tobacco. He reports that he does not currently use alcohol. He reports that he does not use drugs.   Family History: The patient's family history includes ALS in his mother; Aneurysm in his maternal grandmother; Arthritis in an other family member; CAD in his brother and father; Cancer in his maternal grandfather and another family member; Diabetes in an other family member; Diabetes Mellitus II in his brother, brother, father, maternal grandmother, and sister; Hodgkin's lymphoma in his father; Liver cancer in his father.   ROS:  Please see the history of present illness. Otherwise, complete review of systems is positive for none.  All other systems are reviewed and negative.   Physical Exam: VS:  BP 118/80   Pulse 73   Ht 5' 10.5" (1.791 m)   Wt 254 lb 12.8 oz (115.6 kg)   SpO2 99%   BMI 36.04 kg/m , BMI Body mass index is 36.04 kg/m.  Wt Readings from Last 3 Encounters:  10/13/21 254 lb 12.8 oz (115.6 kg)  10/01/21 239 lb 3.2 oz (108.5 kg)  09/30/21 239 lb 6.7 oz (108.6 kg)    General: Patient appears comfortable at rest. Neck: Supple, no elevated JVP or carotid bruits, no thyromegaly. Lungs: Clear to auscultation, nonlabored breathing at rest. Cardiac: Regular rate and rhythm, no S3 or significant systolic murmur, no pericardial rub. Extremities: No pitting edema, distal pulses 2+. Skin: Warm and dry. Musculoskeletal: No  kyphosis. Neuropsychiatric: Alert and oriented x3, affect grossly appropriate.  ECG:    Recent Labwork: 02/02/2021: TSH 1.901 07/28/2021: B Natriuretic Peptide 351.0 08/22/2021: ALT 9; AST 23; BUN 12; Creatinine, Ser 1.09; Magnesium 1.9; Potassium 4.0; Sodium 137 09/23/2021: Hemoglobin 11.0; Platelets 102     Component Value Date/Time   CHOL 120 05/25/2019 1001  TRIG 136 05/25/2019 1001   HDL 38 (L) 05/25/2019 1001   CHOLHDL 3.2 05/25/2019 1001   VLDL 27 05/25/2019 1001   LDLCALC 55 05/25/2019 1001    Other Studies Reviewed Today:   Cardiac Catheterization: 08/2015 Mid RCA to Dist RCA lesion, 100% stenosed. 1st Mrg lesion, 90% stenosed. Ramus lesion, 70% stenosed. Ost Cx to Dist Cx lesion, 60% stenosed. Ost 1st Diag lesion, 90% stenosed. Ost 2nd Diag to 2nd Diag lesion, 70% stenosed. Mid LAD lesion, 90% stenosed. Post Atrio lesion, 90% stenosed. Dist RCA lesion, 100% stenosed.   Severe native vessel coronary artery disease with total occlusion of the distal RCA (likely culprit for acute presentation), high-grade obstruction in the proximal to mid LAD, high-grade obstruction in the first obtuse marginal, significant stenosis in the ramus intermedius, first diagonal, and second diagonal. LVEF 50% with marked elevation in LVEDP, consistent with acute diastolic left heart failure. Distal right coronary is collateralized from the LAD and circumflex. The right coronary is dominant.     RECOMMENDATIONS:   TCTS consultation for consideration of surgical revascularization. Continue aspirin. No Plavix. In TIMI IV heparin.     Echocardiogram: 05/2020 IMPRESSIONS     1. Left ventricular ejection fraction, by estimation, is 60 to 65%. The  left ventricle has normal function. The left ventricle has no regional  wall motion abnormalities. The left ventricular internal cavity size was  mildly dilated. There is mild left  ventricular hypertrophy. Left ventricular diastolic parameters  are  indeterminate.   2. Right ventricular systolic function is normal. The right ventricular  size is normal. There is normal pulmonary artery systolic pressure.   3. The mitral valve is abnormal. Mild mitral valve regurgitation.   4. AV is thickened, calcifieid Difficult to see well. Peak and mean  gradients through the valve are 31 and 18 mm Hg respectively consistent  with mild AS. Compared to echo report from 2019, gradients are increased.  . The aortic valve is abnormal. Aortic  valve regurgitation is not visualized. Mild aortic valve stenosis.   5. The inferior vena cava is dilated in size with <50% respiratory  variability, suggesting right atrial pressure of 15 mmHg.    Echocardiogram: 12/2020 NORMAL LEFT VENTRICULAR SYSTOLIC FUNCTION    NORMAL RIGHT VENTRICULAR SYSTOLIC FUNCTION    NO VALVULAR REGURGITATION    VALVULAR STENOSIS: MILD AS    POOR SOUND TRANSMISSION   Assessment and Plan:  1. CAD in native artery   2. Chronic diastolic heart failure ([place])   3. Nonrheumatic aortic valve stenosis   4. Stage 3b chronic kidney disease ([place])   5. Cirrhosis of liver with ascites, unspecified hepatic cirrhosis type ([place])   6. OSA on CPAP    1. CAD in native artery Denies any anginal or exertional symptoms.  Continue aspirin 81 mg daily.  Pravastatin 20 mg daily.  2. Chronic diastolic heart failure ([place]) Denies any DOE or SOB.  Denies any PND, orthopnea.  No lower extremity edema.  He has gained some weight back from previous weight.  Today's weight 254 on our scales.  His lungs are clear to auscultation all fields.  No overt clinical signs of heart failure.  Continue spironolactone 12.5 mg p.o. daily.  Continue torsemide 20 mg daily.  Continue nadolol 20 mg p.o. daily.   3. Nonrheumatic aortic valve stenosis Last echocardiogram January 7793 normal LV systolic function, normal right ventricular systolic function, mild AS.  4. Stage 3b chronic kidney disease ([place]) Comprehensive  metabolic panel on  08/2021 creatinine 1.09 GFR greater than 60.  5. Cirrhosis of liver with ascites, unspecified hepatic cirrhosis type ([place]) History of [place], liver cirrhosis, esophageal varices, portal hypertension.  Needing liver transplant.  Patient states he will be following up soon with doctors at [facility] for potential liver transplant.  He states his sister may be a potential donor.  6. OSA on CPAP He is compliant with CPAP  Medication Adjustments/Labs and Tests Ordered: Current medicines are reviewed at length with the patient today.  Concerns regarding medicines are outlined above.   Disposition: Follow-up with with Bernerd Pho, PA at scheduled visit in January  Signed, Meela Wareing Quinn, NP 10/13/2021 4:57 PM    [facility], [place], [place] [address] Phone: [phone]; Fax: [phone]

## 2021-10-14 ENCOUNTER — Telehealth (HOSPITAL_COMMUNITY): Payer: Self-pay | Admitting: Physical Therapy

## 2021-10-14 ENCOUNTER — Encounter (HOSPITAL_COMMUNITY): Payer: Federal, State, Local not specified - PPO | Admitting: Physical Therapy

## 2021-10-14 DIAGNOSIS — K7460 Unspecified cirrhosis of liver: Secondary | ICD-10-CM | POA: Diagnosis not present

## 2021-10-14 DIAGNOSIS — D696 Thrombocytopenia, unspecified: Secondary | ICD-10-CM | POA: Diagnosis not present

## 2021-10-14 NOTE — Telephone Encounter (Signed)
Pt did not show for appointment.  Called and left message regarding missed appointment and reminder for next appt on Tuesday 11/8 at 10 am.  Teena Irani, PTA/CLT, [place]

## 2021-10-15 ENCOUNTER — Other Ambulatory Visit (HOSPITAL_COMMUNITY)
Admission: RE | Admit: 2021-10-15 | Discharge: 2021-10-15 | Disposition: A | Discharge status: Home / Self Care | Payer: Federal, State, Local not specified - PPO | Source: Ambulatory Visit | Attending: Nephrology | Admitting: Nephrology

## 2021-10-15 DIAGNOSIS — N189 Chronic kidney disease, unspecified: Secondary | ICD-10-CM | POA: Insufficient documentation

## 2021-10-15 LAB — CBC
HCT: 36.8 % — ABNORMAL LOW (ref 39.0–52.0)
Hemoglobin: 12 g/dL — ABNORMAL LOW (ref 13.0–17.0)
MCH: 30.1 pg (ref 26.0–34.0)
MCHC: 32.6 g/dL (ref 30.0–36.0)
MCV: 92.2 fL (ref 80.0–100.0)
Platelets: 76 10*3/uL — ABNORMAL LOW (ref 150–400)
RBC: 3.99 MIL/uL — ABNORMAL LOW (ref 4.22–5.81)
RDW: 17.2 % — ABNORMAL HIGH (ref 11.5–15.5)
WBC: 4.1 10*3/uL (ref 4.0–10.5)
nRBC: 0 % (ref 0.0–0.2)

## 2021-10-15 LAB — RENAL FUNCTION PANEL
Albumin: 3.3 g/dL — ABNORMAL LOW (ref 3.5–5.0)
Anion gap: 6 (ref 5–15)
BUN: 16 mg/dL (ref 8–23)
CO2: 24 mmol/L (ref 22–32)
Calcium: 9.1 mg/dL (ref 8.9–10.3)
Chloride: 103 mmol/L (ref 98–111)
Creatinine, Ser: 1.08 mg/dL (ref 0.61–1.24)
GFR, Estimated: >60 mL/min (ref >60)
Glucose, Bld: 256 mg/dL — ABNORMAL HIGH (ref 70–99)
Phosphorus: 2.6 mg/dL (ref 2.5–4.6)
Potassium: 4.3 mmol/L (ref 3.5–5.1)
Sodium: 133 mmol/L — ABNORMAL LOW (ref 135–145)

## 2021-10-15 LAB — IRON AND TIBC
Iron: 203 ug/dL — ABNORMAL HIGH (ref 45–182)
Saturation Ratios: 62 % — ABNORMAL HIGH (ref 17.9–39.5)
TIBC: 328 ug/dL (ref 250–450)
UIBC: 125 ug/dL

## 2021-10-15 LAB — PROTEIN / CREATININE RATIO, URINE
Creatinine, Urine: 119.2 mg/dL
Protein Creatinine Ratio: 0.15 mg/mg{Cre} (ref 0.00–0.15)
Total Protein, Urine: 18 mg/dL

## 2021-10-15 LAB — FERRITIN: Ferritin: 149 ng/mL (ref 24–336)

## 2021-10-15 LAB — VITAMIN D 25 HYDROXY (VIT D DEFICIENCY, FRACTURES): Vit D, 25-Hydroxy: 34.83 ng/mL (ref 30–100)

## 2021-10-16 ENCOUNTER — Inpatient Hospital Stay (HOSPITAL_COMMUNITY): Payer: Federal, State, Local not specified - PPO | Attending: Hematology

## 2021-10-16 ENCOUNTER — Other Ambulatory Visit: Payer: Self-pay

## 2021-10-16 ENCOUNTER — Encounter (HOSPITAL_COMMUNITY): Payer: Self-pay

## 2021-10-16 VITALS — BP 111/55 | HR 68 | Temp 97.2°F | Resp 18

## 2021-10-16 DIAGNOSIS — Z79899 Other long term (current) drug therapy: Secondary | ICD-10-CM | POA: Insufficient documentation

## 2021-10-16 DIAGNOSIS — D696 Thrombocytopenia, unspecified: Secondary | ICD-10-CM | POA: Diagnosis not present

## 2021-10-16 DIAGNOSIS — D509 Iron deficiency anemia, unspecified: Secondary | ICD-10-CM

## 2021-10-16 DIAGNOSIS — D539 Nutritional anemia, unspecified: Secondary | ICD-10-CM

## 2021-10-16 DIAGNOSIS — E871 Hypo-osmolality and hyponatremia: Secondary | ICD-10-CM | POA: Diagnosis not present

## 2021-10-16 DIAGNOSIS — I129 Hypertensive chronic kidney disease with stage 1 through stage 4 chronic kidney disease, or unspecified chronic kidney disease: Secondary | ICD-10-CM | POA: Diagnosis not present

## 2021-10-16 DIAGNOSIS — N189 Chronic kidney disease, unspecified: Secondary | ICD-10-CM | POA: Diagnosis not present

## 2021-10-16 MED ORDER — SODIUM CHLORIDE 0.9 % IV SOLN: Freq: Once | INTRAVENOUS | Status: AC

## 2021-10-16 MED ORDER — SODIUM CHLORIDE 0.9 % IV SOLN
300.0000 mg | Freq: Once | INTRAVENOUS | Status: AC
  Administered 2021-10-16: 300 mg via INTRAVENOUS
  Filled 2021-10-16: qty 300

## 2021-10-16 NOTE — Progress Notes (Signed)
Patient takes his own claritin at home.   Iron infusion given per orders. Patient tolerated it well without problems. Vitals stable and discharged home from clinic ambulatory. Follow up as scheduled.

## 2021-10-16 NOTE — Patient Instructions (Signed)
[facility]  Discharge Instructions: Thank you for choosing [facility] to provide your oncology and hematology care.  If you have a lab appointment with the Cancer Center, please come in thru the Main Entrance and check in at the main information desk.     We strive to give you quality time with your provider. You may need to reschedule your appointment if you arrive late (15 or more minutes).  Arriving late affects you and other patients whose appointments are after yours.  Also, if you miss three or more appointments without notifying the office, you may be dismissed from the clinic at the provider's discretion.      For prescription refill requests, have your pharmacy contact our office and allow 72 hours for refills to be completed.     Should you have questions after your visit or need to cancel or reschedule your appointment, please contact [facility] [phone]  and follow the prompts.  Office hours are 8:00 a.m. to 4:30 p.m. Monday - Friday. Please note that voicemails left after 4:00 p.m. may not be returned until the following business day.  We are closed weekends and major holidays. You have access to a nurse at all times for urgent questions. Please call the main number to the clinic [phone] and follow the prompts.  For any non-urgent questions, you may also contact your provider using MyChart. We now offer e-Visits for anyone 18 and older to request care online for non-urgent symptoms. For details visit [email or web address].   Also download the MyChart app! Go to the app store, search "MyChart", open the app, select , and log in with your MyChart username and password.  Due to Covid, a mask is required upon entering the hospital/clinic. If you do not have a mask, one will be given to you upon arrival. For doctor visits, patients may have 1 support person aged 18 or older with them. For treatment visits, patients cannot have  anyone with them due to current Covid guidelines and our immunocompromised population.  

## 2021-10-17 LAB — PTH, INTACT AND CALCIUM
Calcium, Total (PTH): 9.3 mg/dL (ref 8.6–10.2)
PTH: 22 pg/mL (ref 15–65)

## 2021-10-18 ENCOUNTER — Emergency Department (HOSPITAL_COMMUNITY)
Admission: EM | Admit: 2021-10-18 | Discharge: 2021-10-18 | Disposition: A | Discharge status: Home / Self Care | Payer: Federal, State, Local not specified - PPO | Attending: Emergency Medicine | Admitting: Emergency Medicine

## 2021-10-18 ENCOUNTER — Other Ambulatory Visit: Payer: Self-pay

## 2021-10-18 ENCOUNTER — Emergency Department (HOSPITAL_COMMUNITY): Payer: Federal, State, Local not specified - PPO

## 2021-10-18 ENCOUNTER — Encounter (HOSPITAL_COMMUNITY): Payer: Self-pay

## 2021-10-18 DIAGNOSIS — R609 Edema, unspecified: Secondary | ICD-10-CM | POA: Diagnosis not present

## 2021-10-18 DIAGNOSIS — R0602 Shortness of breath: Secondary | ICD-10-CM | POA: Diagnosis not present

## 2021-10-18 DIAGNOSIS — I5032 Chronic diastolic (congestive) heart failure: Secondary | ICD-10-CM | POA: Diagnosis not present

## 2021-10-18 DIAGNOSIS — E1122 Type 2 diabetes mellitus with diabetic chronic kidney disease: Secondary | ICD-10-CM | POA: Insufficient documentation

## 2021-10-18 DIAGNOSIS — I130 Hypertensive heart and chronic kidney disease with heart failure and stage 1 through stage 4 chronic kidney disease, or unspecified chronic kidney disease: Secondary | ICD-10-CM | POA: Diagnosis not present

## 2021-10-18 DIAGNOSIS — Z794 Long term (current) use of insulin: Secondary | ICD-10-CM | POA: Insufficient documentation

## 2021-10-18 DIAGNOSIS — Z7982 Long term (current) use of aspirin: Secondary | ICD-10-CM | POA: Diagnosis not present

## 2021-10-18 DIAGNOSIS — N1832 Chronic kidney disease, stage 3b: Secondary | ICD-10-CM | POA: Diagnosis not present

## 2021-10-18 DIAGNOSIS — Z96652 Presence of left artificial knee joint: Secondary | ICD-10-CM | POA: Insufficient documentation

## 2021-10-18 DIAGNOSIS — R079 Chest pain, unspecified: Secondary | ICD-10-CM | POA: Diagnosis not present

## 2021-10-18 DIAGNOSIS — I2510 Atherosclerotic heart disease of native coronary artery without angina pectoris: Secondary | ICD-10-CM | POA: Insufficient documentation

## 2021-10-18 DIAGNOSIS — R0789 Other chest pain: Secondary | ICD-10-CM | POA: Insufficient documentation

## 2021-10-18 DIAGNOSIS — I4891 Unspecified atrial fibrillation: Secondary | ICD-10-CM | POA: Diagnosis not present

## 2021-10-18 DIAGNOSIS — Z951 Presence of aortocoronary bypass graft: Secondary | ICD-10-CM | POA: Insufficient documentation

## 2021-10-18 DIAGNOSIS — R7989 Other specified abnormal findings of blood chemistry: Secondary | ICD-10-CM | POA: Diagnosis not present

## 2021-10-18 DIAGNOSIS — Z79899 Other long term (current) drug therapy: Secondary | ICD-10-CM | POA: Diagnosis not present

## 2021-10-18 LAB — CBC WITH DIFFERENTIAL/PLATELET
Abs Immature Granulocytes: 0.03 10*3/uL (ref 0.00–0.07)
Basophils Absolute: 0 10*3/uL (ref 0.0–0.1)
Basophils Relative: 0 %
Eosinophils Absolute: 0.1 10*3/uL (ref 0.0–0.5)
Eosinophils Relative: 2 %
HCT: 34.9 % — ABNORMAL LOW (ref 39.0–52.0)
Hemoglobin: 11.6 g/dL — ABNORMAL LOW (ref 13.0–17.0)
Immature Granulocytes: 1 %
Lymphocytes Relative: 21 %
Lymphs Abs: 1 10*3/uL (ref 0.7–4.0)
MCH: 31 pg (ref 26.0–34.0)
MCHC: 33.2 g/dL (ref 30.0–36.0)
MCV: 93.3 fL (ref 80.0–100.0)
Monocytes Absolute: 0.7 10*3/uL (ref 0.1–1.0)
Monocytes Relative: 15 %
Neutro Abs: 2.8 10*3/uL (ref 1.7–7.7)
Neutrophils Relative %: 61 %
Platelets: 76 10*3/uL — ABNORMAL LOW (ref 150–400)
RBC: 3.74 MIL/uL — ABNORMAL LOW (ref 4.22–5.81)
RDW: 17.5 % — ABNORMAL HIGH (ref 11.5–15.5)
WBC: 4.6 10*3/uL (ref 4.0–10.5)
nRBC: 0 % (ref 0.0–0.2)

## 2021-10-18 LAB — D-DIMER, QUANTITATIVE: D-Dimer, Quant: 0.97 ug/mL-FEU — ABNORMAL HIGH (ref 0.00–0.50)

## 2021-10-18 LAB — COMPREHENSIVE METABOLIC PANEL
ALT: 19 U/L (ref 0–44)
AST: 45 U/L — ABNORMAL HIGH (ref 15–41)
Albumin: 3.3 g/dL — ABNORMAL LOW (ref 3.5–5.0)
Alkaline Phosphatase: 95 U/L (ref 38–126)
Anion gap: 7 (ref 5–15)
BUN: 18 mg/dL (ref 8–23)
CO2: 26 mmol/L (ref 22–32)
Calcium: 9.2 mg/dL (ref 8.9–10.3)
Chloride: 100 mmol/L (ref 98–111)
Creatinine, Ser: 1.12 mg/dL (ref 0.61–1.24)
GFR, Estimated: >60 mL/min (ref >60)
Glucose, Bld: 241 mg/dL — ABNORMAL HIGH (ref 70–99)
Potassium: 3.6 mmol/L (ref 3.5–5.1)
Sodium: 133 mmol/L — ABNORMAL LOW (ref 135–145)
Total Bilirubin: 1 mg/dL (ref 0.3–1.2)
Total Protein: 7.2 g/dL (ref 6.5–8.1)

## 2021-10-18 LAB — TROPONIN I (HIGH SENSITIVITY)
Troponin I (High Sensitivity): 7 ng/L (ref <18)
Troponin I (High Sensitivity): 7 ng/L (ref <18)

## 2021-10-18 LAB — LIPASE, BLOOD: Lipase: 36 U/L (ref 11–51)

## 2021-10-18 MED ORDER — IOHEXOL 350 MG/ML SOLN
100.0000 mL | Freq: Once | INTRAVENOUS | Status: AC | PRN
  Administered 2021-10-18: 100 mL via INTRAVENOUS

## 2021-10-18 MED ORDER — KETOROLAC TROMETHAMINE 30 MG/ML IJ SOLN
15.0000 mg | Freq: Once | INTRAMUSCULAR | Status: AC
  Administered 2021-10-18: 15 mg via INTRAVENOUS
  Filled 2021-10-18: qty 1

## 2021-10-18 NOTE — ED Provider Notes (Signed)
[facility] Provider Note   CSN: [id number] Arrival date & time: 10/18/21  0239     History Chief Complaint  Patient presents with   Chest Pain    [name] is a 60 y.o. male.  60 y.o. male with a history of CAD/NSTEMI/CABG 2016, aortic stenosis, adrenal insufficiency, polyclonal gammopathy, CKD, chronic diastolic heart failure, HTN, HLD, IDA, OSA, thrombocytopenia, NASH/cirrhosis/esophageal varices/portal hypertension, DM2 presenting with right-sided chest pain and pressure that 45 minutes ago while he was resting.  Denies any fall or injury.  There is some shortness of breath.  No nausea, vomiting, cough, fever, diaphoresis.  He has never had this kind of pain in the past.  He took aspirin at home without relief.  Did not take any nitroglycerin.  Pain is worse with movement and palpation.  It is not exertional or pleuritic. Patient recently saw his cardiologist had a reassuring work-up.  This pain is not similar to when he had his bypass in 2016.  He does not think he had a stress test since then. The pain is worse when he lifts his arm and worse when his chest is pressed on.  Denies abdominal pain or back pain.  Denies any cough or fever.  Denies any vomiting or diarrhea.  Denies any diaphoresis.  The history is provided by the patient.  Chest Pain Associated symptoms: shortness of breath   Associated symptoms: no abdominal pain, no back pain, no dizziness, no fatigue, no fever, no headache, no nausea, no vomiting and no weakness       Past Medical History:  Diagnosis Date   Anemia    Arthritis    CAD (coronary artery disease)    Multivessel disease status post CABG 08/2015   Cirrhosis ([place])    CKD (chronic kidney disease) stage 3, GFR 30-59 ml/min (HCC)    Diastolic CHF ([place])    Essential hypertension    Hyperlipidemia    Iron deficiency anemia 07/15/2021   OSA on CPAP    Polyclonal gammopathy    Thrombocytopenia (HCC) 2016   Type 2 diabetes  mellitus ([place])     Patient Active Problem List   Diagnosis Date Noted   Muscle spasm 08/14/2021   CAD (coronary artery disease) 08/14/2021   HLD (hyperlipidemia) 08/14/2021   Chronic idiopathic thrombocytopenia ([place]) 08/14/2021   Increased ammonia level 08/14/2021   Obese 08/14/2021   Fall at home/Back Pain and Ambulatory Dysfunction 08/14/2021   Hypotension due to hypovolemia 08/14/2021   Hypokalemia 26/71/2458   Acute metabolic encephalopathy 09/98/3382   Chronic kidney disease 07/21/2021   Pure hypercholesterolemia 07/21/2021   GI bleed 07/21/2021   Iron deficiency anemia 07/15/2021   Hyperkalemia 03/17/2021   Elevated troponin 03/17/2021   Back pain 03/17/2021   Hypertensive heart and kidney disease with heart failure and with chronic kidney disease stage III ([place]) 12/17/2020   Chronic diastolic heart failure ([place]) 12/17/2020   CKD stage 3 due to type 2 diabetes mellitus ([place]) 12/17/2020   Hyperlipidemia associated with type 2 diabetes mellitus ([place]) 12/17/2020   Anxiety 12/17/2020   Chronic non-seasonal allergic rhinitis 12/17/2020   Hypotension 12/10/2020   Acute renal failure superimposed on stage 3b chronic kidney disease ([place]) 12/10/2020   Acute renal failure superimposed on stage 3b chronic kidney disease, unspecified acute renal failure type ([place]) 12/10/2020   AKI (acute kidney injury) ([place]) 12/09/2020   GERD (gastroesophageal reflux disease) 10/09/2020   Portal hypertension ([place]) 08/26/2020   Pre-transplant evaluation for liver transplant  08/26/2020   Atherosclerotic heart disease of native coronary artery without angina pectoris 07/17/2020   NASH cirrhosis of liver ([place]) 07/17/2020   Anasarca 07/17/2020   Heart failure, unspecified ([place]) 07/17/2020   Morbid obesity ([place]) 07/17/2020   Acute on chronic heart failure with preserved ejection fraction (HCC)    Liver failure ([place]) 06/20/2020   Esophageal varices ([place]) 01/09/2020   History of adenomatous polyp of  colon 01/09/2020   Elevated AST (SGOT) 08/23/2019   Elevation of level of transaminase and lactic acid dehydrogenase (LDH) 08/23/2019   Diarrhea 07/17/2019   Hypersomnia, persistent 03/12/2019   Macrocytic anemia 01/18/2019   Other pancytopenia ([place]) 01/18/2019   Anemia 01/18/2019   Thrombocytopenia ([place]) 12/30/2018   Polyclonal gammopathy 12/30/2018   S/P total knee replacement, left 03/09/17 11/22/2017   Primary osteoarthritis of left knee 03/09/2017   History of nocturia 03/02/2017   OSA on CPAP 03/02/2016   Hypoxia 03/02/2016   Obstructive sleep apnea (adult) (pediatric) 03/02/2016   Cyst of mediastinum 01/15/2016   S/P CABG x 4 08/29/2015   Obesity, Class III, BMI 40-49.9 (morbid obesity) ([place])    Hyperglycemia    Pain in the chest 08/23/2015   Non-ST elevation MI (NSTEMI) ([place]) 08/23/2015   Diabetes mellitus type 2, uncontrolled 08/23/2015   Essential hypertension 08/23/2015   Hyperlipidemia 08/23/2015   Type 2 diabetes mellitus with hypoglycemia with coma ([place]) 08/23/2015   Myocardial infarction ([place]) 08/23/2015   Type 2 diabetes mellitus with hyperglycemia ([place]) 2016   Class 2 obesity 2016   Hyponatremia 2016   Osteoarthritis, knee 05/24/2012   Knee pain 10/29/2011   Knee stiffness 10/29/2011   S/P right knee arthroscopy 10/26/2011   Medial meniscus, posterior horn derangement 09/07/2011   Lateral meniscus derangement 09/07/2011   OA (osteoarthritis) of knee 09/07/2011   Rotator cuff syndrome of left shoulder 08/19/2011   Right knee meniscal tear 08/19/2011    Past Surgical History:  Procedure Laterality Date   APPENDECTOMY     Biceps tendon surgery Right    BIOPSY  11/22/2019   Procedure: BIOPSY;  Surgeon: Daneil Dolin, MD;  [facility];  Service: Endoscopy;;   CARDIAC CATHETERIZATION N/A 08/25/2015   Procedure: Left Heart Cath and Coronary Angiography;  Surgeon: Belva Crome, MD;  Location: [facility];  Service: Cardiovascular;   Laterality: N/A;   COLONOSCOPY WITH PROPOFOL N/A 11/22/2019   Procedure: COLONOSCOPY WITH PROPOFOL;  Surgeon: Daneil Dolin, MD; Four 4-5 mm polyps, findings suggestive of portal colopathy, congested appearing colonic mucosa diffusely, rectal varices, and adequate right colon prep.  Pathology with tubular adenomas and hyperplastic polyp.  Right colon biopsy with focal active colitis.  Recommendations to repeat colonoscopy in 3 months due to poor prep.   COLONOSCOPY WITH PROPOFOL N/A 03/17/2020   Procedure: COLONOSCOPY WITH PROPOFOL;  Surgeon: Daneil Dolin, MD;  [facility];  Service: Endoscopy;  Laterality: N/A;  8:45am - pt does not need covid test, was + 2/4 <90 days   CORONARY ARTERY BYPASS GRAFT N/A 08/29/2015   Procedure: CORONARY ARTERY BYPASS GRAFTING (CABG);  Surgeon: [name], MD;  Location: [facility];  Service: Open Heart Surgery;  Laterality: N/A;   ESOPHAGEAL BANDING N/A 07/23/2021   Procedure: ESOPHAGEAL BANDING;  Surgeon: Eloise Harman, DO;  [facility];  Service: Endoscopy;  Laterality: N/A;   ESOPHAGOGASTRODUODENOSCOPY (EGD) WITH PROPOFOL N/A 11/22/2019   Procedure: ESOPHAGOGASTRODUODENOSCOPY (EGD) WITH PROPOFOL;  Surgeon: Daneil Dolin, MD; 4  columns of grade 2-3 esophageal varices, portal gastropathy, gastric polyp/abnormal gastric mucosa s/p biopsy.  Pathology with hyperplastic polyp, mild chronic gastritis, negative H. pylori.   ESOPHAGOGASTRODUODENOSCOPY (EGD) WITH PROPOFOL N/A 07/23/2021   Procedure: ESOPHAGOGASTRODUODENOSCOPY (EGD) WITH PROPOFOL;  Surgeon: Eloise Harman, DO;  [facility];  Service: Endoscopy;  Laterality: N/A;   KNEE ARTHROSCOPY Left    TEE WITHOUT CARDIOVERSION N/A 08/29/2015   Procedure: TRANSESOPHAGEAL ECHOCARDIOGRAM (TEE);  Surgeon: [name], MD;  Location: [facility];  Service: Open Heart Surgery;  Laterality: N/A;   TOTAL KNEE ARTHROPLASTY Left 03/09/2017   Procedure: TOTAL KNEE ARTHROPLASTY;   Surgeon: Carole Civil, MD;  Location: [facility];  Service: Orthopedics;  Laterality: Left;       Family History  Problem Relation Age of Onset   Arthritis Other    Cancer Other    Diabetes Other    CAD Father    Diabetes Mellitus II Father    Liver cancer Father    Hodgkin's lymphoma Father    CAD Brother    Diabetes Mellitus II Brother    ALS Mother    Diabetes Mellitus II Sister    Diabetes Mellitus II Brother    Diabetes Mellitus II Maternal Grandmother    Aneurysm Maternal Grandmother    Cancer Maternal Grandfather    Anesthesia problems Neg Hx    Hypotension Neg Hx    Malignant hyperthermia Neg Hx    Pseudochol deficiency Neg Hx    Colon cancer Neg Hx     Social History   Tobacco Use   Smoking status: Never   Smokeless tobacco: Never  Vaping Use   Vaping Use: Never used  Substance Use Topics   Alcohol use: Not Currently   Drug use: No    Home Medications Prior to Admission medications   Medication Sig Start Date End Date Taking? Authorizing Provider  acetaminophen (TYLENOL) 325 MG tablet Take 650 mg by mouth every 6 (six) hours as needed for mild pain. 12/19/20   [provider]  albuterol (VENTOLIN HFA) 108 (90 Base) MCG/ACT inhaler Inhale 2 puffs into the lungs every 6 (six) hours as needed for wheezing or shortness of breath. 12/22/20   Gerlene Fee, NP  ALPRAZolam Duanne Moron) 0.5 MG tablet Take 1 tablet (0.5 mg total) by mouth daily as needed. 08/25/21   Roxan Hockey, MD  aspirin EC 81 MG tablet Take 1 tablet (81 mg total) by mouth daily with breakfast. 08/25/21   Roxan Hockey, MD  Cholecalciferol (DIALYVITE VITAMIN D 5000) 125 MCG (5000 UT) capsule Take 5,000 Units by mouth in the morning.     [provider]  ferrous sulfate 325 (65 FE) MG tablet Take 1 tablet (325 mg total) by mouth daily with breakfast. 08/25/21   Emokpae, Courage, MD  insulin aspart (NOVOLOG) 100 UNIT/ML injection Inject 5 Units into the skin 3 (three) times daily  before meals. Special Instructions: If accu-check is greater than 150. Hold for accu-check 150 or below. With Meals 12/22/20   Gerlene Fee, NP  insulin degludec (TRESIBA FLEXTOUCH) 200 UNIT/ML FlexTouch Pen Inject 40 Units into the skin in the morning. Give 30 units Subcutaneous  at Bedtime 8:00 pm Patient taking differently: Inject 30 Units into the skin in the morning. Give 30 units Subcutaneous  at Bedtime 8:00 pm 12/22/20   Gerlene Fee, NP  lactulose (CHRONULAC) 10 GM/15ML solution Take 30 mLs (20 g total) by mouth daily. 12/22/20   Ok Edwards  S, NP  lidocaine (XYLOCAINE) 2 % solution 5 mLs as needed. 06/18/21   [provider]  loratadine (CLARITIN) 10 MG tablet Take 10 mg by mouth in the morning.     [provider]  magnesium oxide (MAG-OX) 400 (240 Mg) MG tablet Take 1 tablet (400 mg total) by mouth daily. 08/26/21   Roxan Hockey, MD  midodrine (PROAMATINE) 10 MG tablet Take 1 tablet (10 mg total) by mouth 3 (three) times daily with meals. 08/25/21   Roxan Hockey, MD  nadolol (CORGARD) 20 MG tablet Take by mouth daily. 12/18/20   [provider]  OZEMPIC, 1 MG/DOSE, 4 MG/3ML SOPN Inject 1 mg into the skin once a week. 12/22/20   Gerlene Fee, NP  pantoprazole (PROTONIX) 40 MG tablet Take 1 tablet (40 mg total) by mouth daily. 08/25/21 08/25/22  Roxan Hockey, MD  potassium chloride (KLOR-CON) 10 MEQ tablet Take 1 tablet (10 mEq total) by mouth daily. Take While taking Torsemide 08/25/21   Roxan Hockey, MD  pravastatin (PRAVACHOL) 20 MG tablet TAKE 1 TABLET(20 MG) BY MOUTH AT BEDTIME 12/22/20   Gerlene Fee, NP  PULMICORT FLEXHALER 180 MCG/ACT inhaler Inhale into the lungs. 08/24/21   [provider]  rifaximin (XIFAXAN) 550 MG TABS tablet Take 1 tablet (550 mg total) by mouth 2 (two) times daily. 08/25/21   Roxan Hockey, MD  spironolactone (ALDACTONE) 25 MG tablet Take 0.5 tablets (12.5 mg total) by mouth daily. 08/26/21   Roxan Hockey, MD  torsemide (DEMADEX) 20 MG tablet Take 1 tablet (20 mg total) by mouth daily. 08/26/21   Roxan Hockey, MD  venlafaxine XR (EFFEXOR-XR) 75 MG 24 hr capsule Take 1 capsule (75 mg total) by mouth daily with breakfast. 08/25/21   Roxan Hockey, MD    Allergies    Fluticasone  Review of Systems   Review of Systems  Constitutional:  Negative for activity change, appetite change, fatigue and fever.  HENT:  Negative for congestion and rhinorrhea.   Respiratory:  Positive for chest tightness and shortness of breath.   Cardiovascular:  Positive for chest pain.  Gastrointestinal:  Negative for abdominal pain, nausea and vomiting.  Genitourinary:  Negative for dysuria and hematuria.  Musculoskeletal:  Negative for arthralgias, back pain and myalgias.  Skin:  Negative for rash.  Neurological:  Negative for dizziness, weakness and headaches.   all other systems are negative except as noted in the HPI and PMH.   Physical Exam Updated Vital Signs BP 98/85   Pulse 75   Temp 97.8 F (36.6 C) (Oral)   Resp 19   Ht 5\' 10"  (1.778 m)   Wt 117.9 kg   SpO2 96%   BMI 37.31 kg/m   Physical Exam Vitals and nursing note reviewed.  Constitutional:      General: He is not in acute distress.    Appearance: He is well-developed. He is obese.  HENT:     Head: Normocephalic and atraumatic.     Mouth/Throat:     Pharynx: No oropharyngeal exudate.  Eyes:     Conjunctiva/sclera: Conjunctivae normal.     Pupils: Pupils are equal, round, and reactive to light.  Neck:     Comments: No meningismus. Cardiovascular:     Rate and Rhythm: Normal rate and regular rhythm.     Heart sounds: Normal heart sounds. No murmur heard. Pulmonary:     Effort: Pulmonary effort is normal. No respiratory distress.     Breath sounds: Normal breath  sounds.     Comments: Right-sided chest tender to palpation, worse with arm movement Chest:     Chest wall: Tenderness present.  Abdominal:     Palpations:  Abdomen is soft.     Tenderness: There is no abdominal tenderness. There is no guarding or rebound.  Musculoskeletal:        General: No tenderness. Normal range of motion.     Cervical back: Normal range of motion and neck supple.     Right lower leg: Edema present.     Left lower leg: Edema present.  Skin:    General: Skin is warm.  Neurological:     Mental Status: He is alert and oriented to person, place, and time.     Cranial Nerves: No cranial nerve deficit.     Motor: No abnormal muscle tone.     Coordination: Coordination normal.     Comments:  5/5 strength throughout. CN 2-12 intact.Equal grip strength.   Psychiatric:        Behavior: Behavior normal.    ED Results / Procedures / Treatments   Labs (all labs ordered are listed, but only abnormal results are displayed) Labs Reviewed  CBC WITH DIFFERENTIAL/PLATELET - Abnormal; Notable for the following components:      Result Value   RBC 3.74 (*)    Hemoglobin 11.6 (*)    HCT 34.9 (*)    RDW 17.5 (*)    Platelets 76 (*)    All other components within normal limits  COMPREHENSIVE METABOLIC PANEL - Abnormal; Notable for the following components:   Sodium 133 (*)    Glucose, Bld 241 (*)    Albumin 3.3 (*)    AST 45 (*)    All other components within normal limits  D-DIMER, QUANTITATIVE - Abnormal; Notable for the following components:   D-Dimer, Quant 0.97 (*)    All other components within normal limits  LIPASE, BLOOD  TROPONIN I (HIGH SENSITIVITY)  TROPONIN I (HIGH SENSITIVITY)    EKG EKG Interpretation  Date/Time:  Sunday October 18 2021 02:50:15 EST Ventricular Rate:  71 PR Interval:  158 QRS Duration: 95 QT Interval:  408 QTC Calculation: 444 R Axis:   66 Text Interpretation: Sinus rhythm Low voltage, precordial leads No significant change was found Confirmed by [name] [phone]) on 10/18/2021 3:03:19 AM  Radiology CT Angio Chest PE W and/or Wo Contrast  Result Date: 10/18/2021 CLINICAL DATA:   Suspected pulmonary embolism.  Positive D-dimer. EXAM: CT ANGIOGRAPHY CHEST WITH CONTRAST TECHNIQUE: Multidetector CT imaging of the chest was performed using the standard protocol during bolus administration of intravenous contrast. Multiplanar CT image reconstructions and MIPs were obtained to evaluate the vascular anatomy. CONTRAST:  144mL OMNIPAQUE IOHEXOL 350 MG/ML SOLN COMPARISON:  Portable chest today, portable chest 08/11/2021. FINDINGS: Cardiovascular: The cardiac size is normal. There are coronary artery calcifications and evidence of remote CABG. There is no pericardial effusion. There is no aortic aneurysm or dissection, with scattered calcifications in the arch and descending segment and homogeneous enhancement. The great vessels are clear. The pulmonary arteries are normal in caliber without visible filling defect through the proximal segmental arteries. The distal segmental arteries and subsegmental arterial bed are obscured by abundant breathing motion. Mediastinum/Nodes: There is no intrathoracic or axillary adenopathy. The lower poles of the thyroid are unremarkable. There are small focal retained mucoid secretions at the distal trachea to the right. Central airways appear clear. There is mild-to-moderate thickening in the distal thoracic esophagus. Lungs/Pleura: There  is no pleural effusion. There is a mildly elevated right diaphragm. The lungs are not well evaluated due to breathing motion. Linear scarring is noted anteriorly in the left upper lobe, mild atelectasis in the posterior bases. No focal consolidation or mass is seen. Upper Abdomen: Both liver and spleen are enlarged, liver measuring 21.5 cm length and the spleen 17.5 cm AP. There is a prominent portal vein measuring 16 mm and cirrhotic configuration of liver without mass enhancement. There are multiple tiny stones in the gallbladder without wall thickening. Some images equivocally suggest faint peripancreatic edema which could be due  to mesenteric congestion related to portal hypertension or could indicate pancreatitis. There is no ductal dilatation or mass. There are mildly enlarged retroperitoneal lymph nodes in the periportal and portacaval spaces. There is no biliary dilatation. Musculoskeletal: There are degenerative changes of the thoracic spine. There is gynecomastia. Review of the MIP images confirms the above findings. IMPRESSION: 1. There is no central, lobar or proximal segmental arterial embolus. The distal segmental arteries and subsegmental arterial bed are obscured due to breathing motion. 1. Old CABG. No cardiomegaly, venous distention or edema. Calcific CAD. 2. Retained mucoid secretions in the trachea. No focal pulmonary consolidation. 3. Mild to moderate distal esophageal thickening. Consider endoscopic follow-up. 4. Cirrhosis of the liver with hepatosplenomegaly and prominent portal vein. 5. Cholelithiasis. 6. Equivocal findings of possible pancreatitis. 7. Mildly enlarged retroperitoneal nodes. 8. Gynecomastia. Electronically Signed   By: Telford Nab M.D.   On: 10/18/2021 04:48   DG Chest Port 1 View  Result Date: 10/18/2021 CLINICAL DATA:  Right-sided chest pain for 1 hour EXAM: PORTABLE CHEST 1 VIEW COMPARISON:  08/11/2021 FINDINGS: Postsurgical changes are again seen and stable. Cardiac shadow is within normal limits. Aortic calcifications are seen. Lungs are well aerated bilaterally. No focal infiltrate or sizable effusion is seen. No bony abnormality is noted. IMPRESSION: No active disease. Electronically Signed   By: Inez Catalina M.D.   On: 10/18/2021 03:19    Procedures Procedures   Medications Ordered in ED Medications - No data to display  ED Course  I have reviewed the triage vital signs and the nursing notes.  Pertinent labs & imaging results that were available during my care of the patient were reviewed by me and considered in my medical decision making (see chart for details).    MDM  Rules/Calculators/A&P                           Right sided chest pain onset about 45 minutes ago with shortness of breath.  Pain worse with palpation and movement.  EKG shows no acute ST changes.  Pain appears atypical for ACS.  Labs and chest x-ray will be obtained.  Troponin is negative.  D-dimer slightly positive.  Chest x-ray is negative.  CT shows no large pulmonary embolism but there is some obscuring from respiratory motion. No aortic dissection.  Some fluid in the esophagus which may correlate with esophagitis.  Patient feels improved.  Troponin negative x2.  Low suspicion for ACS.  EKG is unchanged and troponin negative x2.  CT as above with no evidence of pulmonary embolism or aortic dissection.  Suspect likely musculoskeletal chest pain.  Given patient's chronic thrombocytopenia will need to avoid NSAIDs.  Discussed PCP follow-up.  Return to the ED with exertional chest pain, pain associate with shortness of breath, nausea, vomiting, diaphoresis or any other concerns Final Clinical Impression(s) / ED Diagnoses Final  diagnoses:  Atypical chest pain    Rx / DC Orders ED Discharge Orders     None        Sorcha Rotunno, Annie Main, MD 10/18/21 [phone]

## 2021-10-18 NOTE — ED Triage Notes (Signed)
Pt c/o right sided chest pain that started about an hour ago, denies SOB. 324 of aspirin given in route.   Pt has hx of a-fib.

## 2021-10-18 NOTE — Discharge Instructions (Signed)
There is no evidence of heart attack or blood clot in the lung.  Your pain seems to be coming from your chest wall.  You may take anti-inflammatories as needed for this.  Follow-up with your cardiologist.  Return to the ED with exertional chest pain, shortness of breath, nausea, vomiting, sweating or any other concerns.

## 2021-10-19 ENCOUNTER — Ambulatory Visit: Payer: Self-pay | Admitting: Neurology

## 2021-10-19 ENCOUNTER — Telehealth: Payer: Self-pay | Admitting: Internal Medicine

## 2021-10-19 DIAGNOSIS — F419 Anxiety disorder, unspecified: Secondary | ICD-10-CM | POA: Diagnosis not present

## 2021-10-19 DIAGNOSIS — Z7682 Awaiting organ transplant status: Secondary | ICD-10-CM | POA: Diagnosis not present

## 2021-10-19 DIAGNOSIS — F32A Depression, unspecified: Secondary | ICD-10-CM | POA: Diagnosis not present

## 2021-10-19 LAB — PROTEIN ELECTROPHORESIS, SERUM
A/G Ratio: 0.9 (ref 0.7–1.7)
Albumin ELP: 3.3 g/dL (ref 2.9–4.4)
Alpha-1-Globulin: 0.2 g/dL (ref 0.0–0.4)
Alpha-2-Globulin: 0.6 g/dL (ref 0.4–1.0)
Beta Globulin: 1.4 g/dL — ABNORMAL HIGH (ref 0.7–1.3)
Gamma Globulin: 1.5 g/dL (ref 0.4–1.8)
Globulin, Total: 3.8 g/dL (ref 2.2–3.9)
Total Protein ELP: 7.1 g/dL (ref 6.0–8.5)

## 2021-10-19 NOTE — Telephone Encounter (Signed)
Patient called and said we referred him somewhere else to have his colonoscopy and he said they are referring him back here.  Please call him

## 2021-10-19 NOTE — Telephone Encounter (Signed)
Spoke to pt.  He informed me that [facility] told him that we should be able to do his colonoscopy locally.  Informed pt that I would further discuss with Dr. Gala Romney.  According to last video visit note from Dr. Gala Romney, he recommended pt to have his procedure at [facility] given the difficulties locally.  Pt told me to just let him know and he could make a call to [facility] if needed.  Dr. Gala Romney:  Please advise.

## 2021-10-19 NOTE — Telephone Encounter (Signed)
Spoke to pt.  OV made for 03/02/2022 at 8:30 with Roseanne Kaufman, NP.

## 2021-10-20 ENCOUNTER — Ambulatory Visit (HOSPITAL_COMMUNITY): Payer: Federal, State, Local not specified - PPO | Attending: Internal Medicine | Admitting: Physical Therapy

## 2021-10-20 ENCOUNTER — Other Ambulatory Visit: Payer: Self-pay

## 2021-10-20 DIAGNOSIS — M6281 Muscle weakness (generalized): Secondary | ICD-10-CM | POA: Diagnosis not present

## 2021-10-20 DIAGNOSIS — I10 Essential (primary) hypertension: Secondary | ICD-10-CM | POA: Diagnosis not present

## 2021-10-20 DIAGNOSIS — R2681 Unsteadiness on feet: Secondary | ICD-10-CM | POA: Diagnosis not present

## 2021-10-20 DIAGNOSIS — K766 Portal hypertension: Secondary | ICD-10-CM | POA: Diagnosis not present

## 2021-10-20 DIAGNOSIS — R161 Splenomegaly, not elsewhere classified: Secondary | ICD-10-CM | POA: Diagnosis not present

## 2021-10-20 DIAGNOSIS — K7460 Unspecified cirrhosis of liver: Secondary | ICD-10-CM | POA: Diagnosis not present

## 2021-10-20 NOTE — Therapy (Signed)
[place], [place], [address] Phone: [phone]   Fax:  [phone]  Physical Therapy Treatment  Patient Details  Name: [name] MRN: [id number] Date of Birth: [birth year] Referring Provider (PT): Dr. [name]   Encounter Date: 10/20/2021   PT End of Session - 10/20/21 1044     Visit Number 3    Number of Visits 12    Date for PT Re-Evaluation 11/17/21    Authorization Type BCBS 75 VL with 61 left PT/OT/SP, Medicare as secondary.    Authorization - Visit Number 3    Authorization - Number of Visits 61    Progress Note Due on Visit 10    PT Start Time 6962    PT Stop Time 1044    PT Time Calculation (min) 29 min    Equipment Utilized During Treatment Gait belt    Activity Tolerance Patient limited by fatigue    Behavior During Therapy WFL for tasks assessed/performed             Past Medical History:  Diagnosis Date   Anemia    Arthritis    CAD (coronary artery disease)    Multivessel disease status post CABG 08/2015   Cirrhosis (HCC)    CKD (chronic kidney disease) stage 3, GFR 30-59 ml/min (HCC)    Diastolic CHF ([place])    Essential hypertension    Hyperlipidemia    Iron deficiency anemia 07/15/2021   OSA on CPAP    Polyclonal gammopathy    Thrombocytopenia (HCC) 2016   Type 2 diabetes mellitus ([place])     Past Surgical History:  Procedure Laterality Date   APPENDECTOMY     Biceps tendon surgery Right    BIOPSY  11/22/2019   Procedure: BIOPSY;  Surgeon: Daneil Dolin, MD;  [facility];  Service: Endoscopy;;   CARDIAC CATHETERIZATION N/A 08/25/2015   Procedure: Left Heart Cath and Coronary Angiography;  Surgeon: Belva Crome, MD;  Location: [facility];  Service: Cardiovascular;  Laterality: N/A;   COLONOSCOPY WITH PROPOFOL N/A 11/22/2019   Procedure: COLONOSCOPY WITH PROPOFOL;  Surgeon: Daneil Dolin, MD; Four 4-5 mm polyps, findings suggestive of portal  colopathy, congested appearing colonic mucosa diffusely, rectal varices, and adequate right colon prep.  Pathology with tubular adenomas and hyperplastic polyp.  Right colon biopsy with focal active colitis.  Recommendations to repeat colonoscopy in 3 months due to poor prep.   COLONOSCOPY WITH PROPOFOL N/A 03/17/2020   Procedure: COLONOSCOPY WITH PROPOFOL;  Surgeon: Daneil Dolin, MD;  [facility];  Service: Endoscopy;  Laterality: N/A;  8:45am - pt does not need covid test, was + 2/4 <90 days   CORONARY ARTERY BYPASS GRAFT N/A 08/29/2015   Procedure: CORONARY ARTERY BYPASS GRAFTING (CABG);  Surgeon: [name], MD;  Location: [facility];  Service: Open Heart Surgery;  Laterality: N/A;   ESOPHAGEAL BANDING N/A 07/23/2021   Procedure: ESOPHAGEAL BANDING;  Surgeon: Eloise Harman, DO;  [facility];  Service: Endoscopy;  Laterality: N/A;   ESOPHAGOGASTRODUODENOSCOPY (EGD) WITH PROPOFOL N/A 11/22/2019   Procedure: ESOPHAGOGASTRODUODENOSCOPY (EGD) WITH PROPOFOL;  Surgeon: Daneil Dolin, MD; 4 columns of grade 2-3 esophageal varices, portal gastropathy, gastric polyp/abnormal gastric mucosa s/p biopsy.  Pathology with hyperplastic polyp, mild chronic gastritis, negative H. pylori.   ESOPHAGOGASTRODUODENOSCOPY (EGD) WITH PROPOFOL N/A 07/23/2021   Procedure: ESOPHAGOGASTRODUODENOSCOPY (EGD) WITH PROPOFOL;  Surgeon: Eloise Harman, DO;  [facility];  Service: Endoscopy;  Laterality: N/A;   KNEE ARTHROSCOPY Left    TEE WITHOUT CARDIOVERSION N/A 08/29/2015   Procedure: TRANSESOPHAGEAL ECHOCARDIOGRAM (TEE);  Surgeon: [name], MD;  Location: [facility];  Service: Open Heart Surgery;  Laterality: N/A;   TOTAL KNEE ARTHROPLASTY Left 03/09/2017   Procedure: TOTAL KNEE ARTHROPLASTY;  Surgeon: Carole Civil, MD;  [facility];  Service: Orthopedics;  Laterality: Left;    There were no vitals filed for this visit.   Subjective Assessment - 10/20/21 1017      Subjective Pt arrived late forappt today.  States he missed last appt because he was called to come to an appt at [facility] and forgot to cancel here.  STates he goes back to [facility] this pm for a liver doppler.  States he went to ED Sunday morning due to chest pain but was cleared.  Feeling okay now without any pain or issues.    Currently in Pain? No/denies                [facility] PT Assessment - 10/20/21 0001       Assessment   Medical Diagnosis weakness and falls      Observation/Other Assessments   Focus on Therapeutic Outcomes (FOTO)  FOTO: 63% functional status                           [facility] Adult PT Treatment/Exercise - 10/20/21 0001       Lumbar Exercises: Standing   Heel Raises 15 reps    Other Standing Lumbar Exercises hip abduction, hip extension 15X each    Other Standing Lumbar Exercises tandem stance 30" each LE lead      Lumbar Exercises: Seated   Sit to Stand 10 reps    Sit to Stand Limitations no UE                       PT Short Term Goals - 10/12/21 1126       PT SHORT TERM GOAL #1   Title Patient will be able to perform 5x STS in <18 seconds to demonstrate reduced fall risk    Time 3    Period Weeks    Status On-going    Target Date 10/27/21      PT SHORT TERM GOAL #2   Title Patient will report at least 25% improvement in overall symptoms and/or function to demonstrate improved functional mobility    Time 3    Period Weeks    Status On-going    Target Date 10/27/21      PT SHORT TERM GOAL #3   Title Patient will be independent in self management strategies to improve quality of life and functional outcomes.    Time 3    Period Weeks    Status On-going    Target Date 10/27/21               PT Long Term Goals - 10/12/21 1127       PT LONG TERM GOAL #1   Title Patient will be able to stand on either leg for at least 20 seconds without UE support to demonstrate improved static balance.    Time 6     Period Weeks    Status On-going      PT LONG TERM GOAL #2   Title Patient will report at least 50% improvement in overall symptoms and/or  function to demonstrate improved functional mobility    Time 6    Period Weeks    Status On-going      PT LONG TERM GOAL #3   Title Patient to be able to ambulate 325ft during 2MWT with least restrictive AD in order to improve mobilty and overall community access    Time 6    Period Weeks    Status On-going                   Plan - 10/20/21 1047     Clinical Impression Statement Pt late for appt so unable to progress therex this session.  Completed FOTO with resultant score of 63% functional status.  Continued with established therex with continued challenge noted with tandem stance without UE assist.  Lt leading more challenging than Rt leading.    Personal Factors and Comorbidities Comorbidity 1;Comorbidity 3+;Comorbidity 2;Age;Fitness    Comorbidities CHF, DB, CABG x4, Kidney disease, hx of hypotension    Examination-Activity Limitations Locomotion Level;Transfers;Stand;Stairs;Squat;Bathing;Dressing    Examination-Participation Restrictions Meal Prep;Yard Work;Community Activity;Shop;Cleaning    Stability/Clinical Decision Making Evolving/Moderate complexity    Rehab Potential Good    PT Frequency 2x / week    PT Duration 6 weeks    PT Treatment/Interventions ADLs/Self Care Home Management;Cryotherapy;Electrical Stimulation;Moist Heat;Traction;Balance training;Therapeutic exercise;Therapeutic activities;Manual techniques;Stair training;Gait training;DME Instruction;Neuromuscular re-education;Patient/family education;Passive range of motion    PT Next Visit Plan Increase functional strengthening and balance.    PT Home Exercise Plan STS no UE, hip abduction, hip extension, heelraises    Consulted and Agree with Plan of Care Patient             Patient will benefit from skilled therapeutic intervention in order to improve the  following deficits and impairments:  Pain, Decreased strength, Decreased activity tolerance, Decreased balance, Difficulty walking, Decreased mobility, Decreased knowledge of use of DME  Visit Diagnosis: Unsteadiness on feet  Muscle weakness (generalized)     Problem List Patient Active Problem List   Diagnosis Date Noted   Muscle spasm 08/14/2021   CAD (coronary artery disease) 08/14/2021   HLD (hyperlipidemia) 08/14/2021   Chronic idiopathic thrombocytopenia ([place]) 08/14/2021   Increased ammonia level 08/14/2021   Obese 08/14/2021   Fall at home/Back Pain and Ambulatory Dysfunction 08/14/2021   Hypotension due to hypovolemia 08/14/2021   Hypokalemia 40/98/1191   Acute metabolic encephalopathy 47/82/9562   Chronic kidney disease 07/21/2021   Pure hypercholesterolemia 07/21/2021   GI bleed 07/21/2021   Iron deficiency anemia 07/15/2021   Hyperkalemia 03/17/2021   Elevated troponin 03/17/2021   Back pain 03/17/2021   Hypertensive heart and kidney disease with heart failure and with chronic kidney disease stage III ([place]) 12/17/2020   Chronic diastolic heart failure ([place]) 12/17/2020   CKD stage 3 due to type 2 diabetes mellitus ([employer]) 12/17/2020   Hyperlipidemia associated with type 2 diabetes mellitus ([place]) 12/17/2020   Anxiety 12/17/2020   Chronic non-seasonal allergic rhinitis 12/17/2020   Hypotension 12/10/2020   Acute renal failure superimposed on stage 3b chronic kidney disease ([place]) 12/10/2020   Acute renal failure superimposed on stage 3b chronic kidney disease, unspecified acute renal failure type ([place]) 12/10/2020   AKI (acute kidney injury) ([place]) 12/09/2020   GERD (gastroesophageal reflux disease) 10/09/2020   Portal hypertension ([place]) 08/26/2020   Pre-transplant evaluation for liver transplant 08/26/2020   Atherosclerotic heart disease of native coronary artery without angina pectoris 07/17/2020   NASH cirrhosis of liver ([place]) 07/17/2020   Anasarca 07/17/2020  Heart failure, unspecified ([place]) 07/17/2020   Morbid obesity ([place]) 07/17/2020   Acute on chronic heart failure with preserved ejection fraction (HCC)    Liver failure ([place]) 06/20/2020   Esophageal varices ([place]) 01/09/2020   History of adenomatous polyp of colon 01/09/2020   Elevated AST (SGOT) 08/23/2019   Elevation of level of transaminase and lactic acid dehydrogenase (LDH) 08/23/2019   Diarrhea 07/17/2019   Hypersomnia, persistent 03/12/2019   Macrocytic anemia 01/18/2019   Other pancytopenia ([place]) 01/18/2019   Anemia 01/18/2019   Thrombocytopenia ([place]) 12/30/2018   Polyclonal gammopathy 12/30/2018   S/P total knee replacement, left 03/09/17 11/22/2017   Primary osteoarthritis of left knee 03/09/2017   History of nocturia 03/02/2017   OSA on CPAP 03/02/2016   Hypoxia 03/02/2016   Obstructive sleep apnea (adult) (pediatric) 03/02/2016   Cyst of mediastinum 01/15/2016   S/P CABG x 4 08/29/2015   Obesity, Class III, BMI 40-49.9 (morbid obesity) ([place])    Hyperglycemia    Pain in the chest 08/23/2015   Non-ST elevation MI (NSTEMI) ([place]) 08/23/2015   Diabetes mellitus type 2, uncontrolled 08/23/2015   Essential hypertension 08/23/2015   Hyperlipidemia 08/23/2015   Type 2 diabetes mellitus with hypoglycemia with coma ([place]) 08/23/2015   Myocardial infarction ([place]) 08/23/2015   Type 2 diabetes mellitus with hyperglycemia ([place]) 2016   Class 2 obesity 2016   Hyponatremia 2016   Osteoarthritis, knee 05/24/2012   Knee pain 10/29/2011   Knee stiffness 10/29/2011   S/P right knee arthroscopy 10/26/2011   Medial meniscus, posterior horn derangement 09/07/2011   Lateral meniscus derangement 09/07/2011   OA (osteoarthritis) of knee 09/07/2011   Rotator cuff syndrome of left shoulder 08/19/2011   Right knee meniscal tear 08/19/2011   Teena Irani, PTA/CLT, WTA [phone]  Teena Irani, PTA 10/20/2021, 10:48 AM  [facility] [address] [place], [place], [address] Phone: [phone]   Fax:  [phone]  Name: [name] MRN: [id number] Date of Birth: 1961-06-27

## 2021-10-22 ENCOUNTER — Encounter (HOSPITAL_COMMUNITY): Payer: Self-pay

## 2021-10-22 ENCOUNTER — Ambulatory Visit (HOSPITAL_COMMUNITY): Payer: Federal, State, Local not specified - PPO

## 2021-10-22 ENCOUNTER — Other Ambulatory Visit: Payer: Self-pay

## 2021-10-22 DIAGNOSIS — R2681 Unsteadiness on feet: Secondary | ICD-10-CM | POA: Diagnosis not present

## 2021-10-22 DIAGNOSIS — M6281 Muscle weakness (generalized): Secondary | ICD-10-CM

## 2021-10-22 NOTE — Therapy (Signed)
[place], [place], [address] Phone: [phone]   Fax:  [phone]  Physical Therapy Treatment  Patient Details  Name: [name] MRN: [id number] Date of Birth: Jul 24, 1961 Referring Provider (PT): Dr. [name]   Encounter Date: 10/22/2021   PT End of Session - 10/22/21 0925     Visit Number 4    Number of Visits 12    Date for PT Re-Evaluation 11/17/21    Authorization Type BCBS 75 VL with 61 left PT/OT/SP, Medicare as secondary.    Authorization - Visit Number 4    Authorization - Number of Visits 61    Progress Note Due on Visit 10    PT Start Time 0919    PT Stop Time 1000    PT Time Calculation (min) 41 min    Equipment Utilized During Treatment Gait belt    Activity Tolerance Patient limited by fatigue    Behavior During Therapy WFL for tasks assessed/performed             Past Medical History:  Diagnosis Date   Anemia    Arthritis    CAD (coronary artery disease)    Multivessel disease status post CABG 08/2015   Cirrhosis (HCC)    CKD (chronic kidney disease) stage 3, GFR 30-59 ml/min (HCC)    Diastolic CHF (HCC)    Essential hypertension    Hyperlipidemia    Iron deficiency anemia 07/15/2021   OSA on CPAP    Polyclonal gammopathy    Thrombocytopenia (HCC) 2016   Type 2 diabetes mellitus ([place])     Past Surgical History:  Procedure Laterality Date   APPENDECTOMY     Biceps tendon surgery Right    BIOPSY  11/22/2019   Procedure: BIOPSY;  Surgeon: Daneil Dolin, MD;  [facility];  Service: Endoscopy;;   CARDIAC CATHETERIZATION N/A 08/25/2015   Procedure: Left Heart Cath and Coronary Angiography;  Surgeon: Belva Crome, MD;  Location: [facility];  Service: Cardiovascular;  Laterality: N/A;   COLONOSCOPY WITH PROPOFOL N/A 11/22/2019   Procedure: COLONOSCOPY WITH PROPOFOL;  Surgeon: Daneil Dolin, MD; Four 4-5 mm polyps, findings suggestive of portal  colopathy, congested appearing colonic mucosa diffusely, rectal varices, and adequate right colon prep.  Pathology with tubular adenomas and hyperplastic polyp.  Right colon biopsy with focal active colitis.  Recommendations to repeat colonoscopy in 3 months due to poor prep.   COLONOSCOPY WITH PROPOFOL N/A 03/17/2020   Procedure: COLONOSCOPY WITH PROPOFOL;  Surgeon: Daneil Dolin, MD;  [facility];  Service: Endoscopy;  Laterality: N/A;  8:45am - pt does not need covid test, was + 2/4 <90 days   CORONARY ARTERY BYPASS GRAFT N/A 08/29/2015   Procedure: CORONARY ARTERY BYPASS GRAFTING (CABG);  Surgeon: [name], MD;  Location: [facility];  Service: Open Heart Surgery;  Laterality: N/A;   ESOPHAGEAL BANDING N/A 07/23/2021   Procedure: ESOPHAGEAL BANDING;  Surgeon: Eloise Harman, DO;  [facility];  Service: Endoscopy;  Laterality: N/A;   ESOPHAGOGASTRODUODENOSCOPY (EGD) WITH PROPOFOL N/A 11/22/2019   Procedure: ESOPHAGOGASTRODUODENOSCOPY (EGD) WITH PROPOFOL;  Surgeon: Daneil Dolin, MD; 4 columns of grade 2-3 esophageal varices, portal gastropathy, gastric polyp/abnormal gastric mucosa s/p biopsy.  Pathology with hyperplastic polyp, mild chronic gastritis, negative H. pylori.   ESOPHAGOGASTRODUODENOSCOPY (EGD) WITH PROPOFOL N/A 07/23/2021   Procedure: ESOPHAGOGASTRODUODENOSCOPY (EGD) WITH PROPOFOL;  Surgeon: Eloise Harman, DO;  [facility];  Service: Endoscopy;  Laterality: N/A;   KNEE ARTHROSCOPY Left    TEE WITHOUT CARDIOVERSION N/A 08/29/2015   Procedure: TRANSESOPHAGEAL ECHOCARDIOGRAM (TEE);  Surgeon: [name], MD;  Location: [facility];  Service: Open Heart Surgery;  Laterality: N/A;   TOTAL KNEE ARTHROPLASTY Left 03/09/2017   Procedure: TOTAL KNEE ARTHROPLASTY;  Surgeon: Carole Civil, MD;  [facility];  Service: Orthopedics;  Laterality: Left;    There were no vitals filed for this visit.   Subjective Assessment - 10/22/21 0922      Subjective Pt stated he has constant Rt knee pain, reports compliance with HEP and walks 1/2 mile 3 times a week.  No reports of recent fall.    Pertinent History CHF, DB, CABG x4, Kidney disease, hx of hypotension, awaiting liver transplant and RTKA,    Currently in Pain? Yes    Pain Score 6     Pain Location Knee    Pain Orientation Right    Pain Descriptors / Indicators Aching;Sore    Pain Type Acute pain    Pain Frequency Constant                               [facility] Adult PT Treatment/Exercise - 10/22/21 0001       Lumbar Exercises: Standing   Heel Raises 10 reps;5 seconds    Heel Raises Limitations incline/decline slope, 2 sets toe raises    Functional Squats 10 reps    Functional Squats Limitations front of chair    Other Standing Lumbar Exercises step up 4in 10x BLE      Lumbar Exercises: Seated   Sit to Stand 10 reps    Sit to Stand Limitations no UE                 Balance Exercises - 10/22/21 0001       Balance Exercises: Standing   Tandem Stance Eyes open;Foam/compliant surface;3 reps;30 secs;Intermittent upper extremity support   1 set on floor, 2 on foam wiht intermittent UE support   Sidestepping 2 reps;Theraband   GTB around thigh   Marching Solid surface;10 reps;Intermittent upper extremity assist   Toe tapping on 6in step alternating minimal HHA   Heel Raises Both;10 reps   2 sets incline slope   Toe Raise Both;10 reps   2 sets decline. slope                 PT Short Term Goals - 10/12/21 1126       PT SHORT TERM GOAL #1   Title Patient will be able to perform 5x STS in <18 seconds to demonstrate reduced fall risk    Time 3    Period Weeks    Status On-going    Target Date 10/27/21      PT SHORT TERM GOAL #2   Title Patient will report at least 25% improvement in overall symptoms and/or function to demonstrate improved functional mobility    Time 3    Period Weeks    Status On-going    Target Date 10/27/21       PT SHORT TERM GOAL #3   Title Patient will be independent in self management strategies to improve quality of life and functional outcomes.    Time 3    Period Weeks    Status On-going    Target Date 10/27/21  PT Long Term Goals - 10/12/21 1127       PT LONG TERM GOAL #1   Title Patient will be able to stand on either leg for at least 20 seconds without UE support to demonstrate improved static balance.    Time 6    Period Weeks    Status On-going      PT LONG TERM GOAL #2   Title Patient will report at least 50% improvement in overall symptoms and/or function to demonstrate improved functional mobility    Time 6    Period Weeks    Status On-going      PT LONG TERM GOAL #3   Title Patient to be able to ambulate 392ft during 2MWT with least restrictive AD in order to improve mobilty and overall community access    Time 6    Period Weeks    Status On-going                   Plan - 10/22/21 1001     Clinical Impression Statement Progressed functional strengthening and balalnce training.  Pt tolerated well to session wiht no reports of increased pain.  Was limited by fatigue required periodic short duration seated rest breaks.  Added tandem stance, sidestep with GTB, squat front of chair to HEP, encouraged to complete front of counter and squats in front of chair; verbalized understanding.    Personal Factors and Comorbidities Comorbidity 1;Comorbidity 3+;Comorbidity 2;Age;Fitness    Comorbidities CHF, DB, CABG x4, Kidney disease, hx of hypotension    Examination-Activity Limitations Locomotion Level;Transfers;Stand;Stairs;Squat;Bathing;Dressing    Examination-Participation Restrictions Meal Prep;Yard Work;Community Activity;Shop;Cleaning    Stability/Clinical Decision Making Evolving/Moderate complexity    Clinical Decision Making Moderate    Rehab Potential Good    PT Frequency 2x / week    PT Duration 6 weeks    PT Treatment/Interventions  ADLs/Self Care Home Management;Cryotherapy;Electrical Stimulation;Moist Heat;Traction;Balance training;Therapeutic exercise;Therapeutic activities;Manual techniques;Stair training;Gait training;DME Instruction;Neuromuscular re-education;Patient/family education;Passive range of motion    PT Next Visit Plan Increase functional strengthening and balance.    PT Home Exercise Plan STS no UE, hip abduction, hip extension, heelraises; 11/10: tandem stance, sidestep with GTB, squat front of chair    Consulted and Agree with Plan of Care Patient             Patient will benefit from skilled therapeutic intervention in order to improve the following deficits and impairments:  Pain, Decreased strength, Decreased activity tolerance, Decreased balance, Difficulty walking, Decreased mobility, Decreased knowledge of use of DME  Visit Diagnosis: Muscle weakness (generalized)  Unsteadiness on feet     Problem List Patient Active Problem List   Diagnosis Date Noted   Muscle spasm 08/14/2021   CAD (coronary artery disease) 08/14/2021   HLD (hyperlipidemia) 08/14/2021   Chronic idiopathic thrombocytopenia ([place]) 08/14/2021   Increased ammonia level 08/14/2021   Obese 08/14/2021   Fall at home/Back Pain and Ambulatory Dysfunction 08/14/2021   Hypotension due to hypovolemia 08/14/2021   Hypokalemia 45/80/9983   Acute metabolic encephalopathy 38/25/0539   Chronic kidney disease 07/21/2021   Pure hypercholesterolemia 07/21/2021   GI bleed 07/21/2021   Iron deficiency anemia 07/15/2021   Hyperkalemia 03/17/2021   Elevated troponin 03/17/2021   Back pain 03/17/2021   Hypertensive heart and kidney disease with heart failure and with chronic kidney disease stage III ([place]) 12/17/2020   Chronic diastolic heart failure ([place]) 12/17/2020   CKD stage 3 due to type 2 diabetes mellitus ([place]) 12/17/2020   Hyperlipidemia associated  with type 2 diabetes mellitus ([place]) 12/17/2020   Anxiety 12/17/2020   Chronic  non-seasonal allergic rhinitis 12/17/2020   Hypotension 12/10/2020   Acute renal failure superimposed on stage 3b chronic kidney disease ([place]) 12/10/2020   Acute renal failure superimposed on stage 3b chronic kidney disease, unspecified acute renal failure type ([place]) 12/10/2020   AKI (acute kidney injury) ([place]) 12/09/2020   GERD (gastroesophageal reflux disease) 10/09/2020   Portal hypertension ([place]) 08/26/2020   Pre-transplant evaluation for liver transplant 08/26/2020   Atherosclerotic heart disease of native coronary artery without angina pectoris 07/17/2020   NASH cirrhosis of liver ([place]) 07/17/2020   Anasarca 07/17/2020   Heart failure, unspecified ([place]) 07/17/2020   Morbid obesity ([place]) 07/17/2020   Acute on chronic heart failure with preserved ejection fraction (HCC)    Liver failure ([place]) 06/20/2020   Esophageal varices ([place]) 01/09/2020   History of adenomatous polyp of colon 01/09/2020   Elevated AST (SGOT) 08/23/2019   Elevation of level of transaminase and lactic acid dehydrogenase (LDH) 08/23/2019   Diarrhea 07/17/2019   Hypersomnia, persistent 03/12/2019   Macrocytic anemia 01/18/2019   Other pancytopenia ([place]) 01/18/2019   Anemia 01/18/2019   Thrombocytopenia ([place]) 12/30/2018   Polyclonal gammopathy 12/30/2018   S/P total knee replacement, left 03/09/17 11/22/2017   Primary osteoarthritis of left knee 03/09/2017   History of nocturia 03/02/2017   OSA on CPAP 03/02/2016   Hypoxia 03/02/2016   Obstructive sleep apnea (adult) (pediatric) 03/02/2016   Cyst of mediastinum 01/15/2016   S/P CABG x 4 08/29/2015   Obesity, Class III, BMI 40-49.9 (morbid obesity) ([place])    Hyperglycemia    Pain in the chest 08/23/2015   Non-ST elevation MI (NSTEMI) ([place]) 08/23/2015   Diabetes mellitus type 2, uncontrolled 08/23/2015   Essential hypertension 08/23/2015   Hyperlipidemia 08/23/2015   Type 2 diabetes mellitus with hypoglycemia with coma ([place]) 08/23/2015   Myocardial infarction  ([place]) 08/23/2015   Type 2 diabetes mellitus with hyperglycemia ([place]) 2016   Class 2 obesity 2016   Hyponatremia 2016   Osteoarthritis, knee 05/24/2012   Knee pain 10/29/2011   Knee stiffness 10/29/2011   S/P right knee arthroscopy 10/26/2011   Medial meniscus, posterior horn derangement 09/07/2011   Lateral meniscus derangement 09/07/2011   OA (osteoarthritis) of knee 09/07/2011   Rotator cuff syndrome of left shoulder 08/19/2011   Right knee meniscal tear 08/19/2011   Ihor Austin, LPTA/CLT; CBIS [phone]  Aldona Lento, PTA 10/22/2021, 6:30 PM  [facility] [address] [place], [place], [address] Phone: [phone]   Fax:  [phone]  Name: [name] MRN: [id number] Date of Birth: 1961-12-12

## 2021-10-23 ENCOUNTER — Inpatient Hospital Stay (HOSPITAL_COMMUNITY): Payer: Federal, State, Local not specified - PPO

## 2021-10-23 ENCOUNTER — Encounter (HOSPITAL_COMMUNITY): Payer: Self-pay

## 2021-10-23 VITALS — BP 120/66 | HR 68 | Temp 96.7°F | Resp 18

## 2021-10-23 DIAGNOSIS — D539 Nutritional anemia, unspecified: Secondary | ICD-10-CM

## 2021-10-23 DIAGNOSIS — D509 Iron deficiency anemia, unspecified: Secondary | ICD-10-CM

## 2021-10-23 DIAGNOSIS — Z79899 Other long term (current) drug therapy: Secondary | ICD-10-CM | POA: Diagnosis not present

## 2021-10-23 DIAGNOSIS — D696 Thrombocytopenia, unspecified: Secondary | ICD-10-CM | POA: Diagnosis not present

## 2021-10-23 MED ORDER — SODIUM CHLORIDE 0.9 % IV SOLN
400.0000 mg | Freq: Once | INTRAVENOUS | Status: AC
  Administered 2021-10-23: 400 mg via INTRAVENOUS
  Filled 2021-10-23: qty 20

## 2021-10-23 MED ORDER — SODIUM CHLORIDE 0.9 % IV SOLN
Freq: Once | INTRAVENOUS | Status: AC
Start: 2021-10-23 — End: 2021-10-23

## 2021-10-23 MED ORDER — LORATADINE 10 MG PO TABS
10.0000 mg | ORAL_TABLET | Freq: Once | ORAL | Status: AC
  Administered 2021-10-23: 10 mg via ORAL
  Filled 2021-10-23: qty 1

## 2021-10-23 NOTE — Progress Notes (Signed)
Venofer 400 mgs given today per MD orders. Tolerated infusion without adverse affects. Vital signs stable. No complaints at this time. Discharged from clinic ambulatory in stable condition. Alert and oriented x 3. F/U with [facility] as scheduled.

## 2021-10-23 NOTE — Patient Instructions (Signed)
[employer]  Discharge Instructions: Thank you for choosing [facility] to provide your oncology and hematology care.  If you have a lab appointment with the [facility], please come in thru the Main Entrance and check in at the main information desk.  Wear comfortable clothing and clothing appropriate for easy access to any Portacath or PICC line.   We strive to give you quality time with your provider. You may need to reschedule your appointment if you arrive late (15 or more minutes).  Arriving late affects you and other patients whose appointments are after yours.  Also, if you miss three or more appointments without notifying the office, you may be dismissed from the clinic at the provider's discretion.      For prescription refill requests, have your pharmacy contact our office and allow 72 hours for refills to be completed.    Today you received the following : Venofer 400 mg.       To help prevent nausea and vomiting after your treatment, we encourage you to take your nausea medication as directed.  BELOW ARE SYMPTOMS THAT SHOULD BE REPORTED IMMEDIATELY: *FEVER GREATER THAN 100.4 F (38 C) OR HIGHER *CHILLS OR SWEATING *NAUSEA AND VOMITING THAT IS NOT CONTROLLED WITH YOUR NAUSEA MEDICATION *UNUSUAL SHORTNESS OF BREATH *UNUSUAL BRUISING OR BLEEDING *URINARY PROBLEMS (pain or burning when urinating, or frequent urination) *BOWEL PROBLEMS (unusual diarrhea, constipation, pain near the anus) TENDERNESS IN MOUTH AND THROAT WITH OR WITHOUT PRESENCE OF ULCERS (sore throat, sores in mouth, or a toothache) UNUSUAL RASH, SWELLING OR PAIN  UNUSUAL VAGINAL DISCHARGE OR ITCHING   Items with * indicate a potential emergency and should be followed up as soon as possible or go to the Emergency Department if any problems should occur.  Please show the CHEMOTHERAPY ALERT CARD or IMMUNOTHERAPY ALERT CARD at check-in to the Emergency Department and triage nurse.  Should  you have questions after your visit or need to cancel or reschedule your appointment, please contact [facility] [phone]  and follow the prompts.  Office hours are 8:00 a.m. to 4:30 p.m. Monday - Friday. Please note that voicemails left after 4:00 p.m. may not be returned until the following business day.  We are closed weekends and major holidays. You have access to a nurse at all times for urgent questions. Please call the main number to the clinic [phone] and follow the prompts.  For any non-urgent questions, you may also contact your provider using MyChart. We now offer e-Visits for anyone 14 and older to request care online for non-urgent symptoms. For details visit [email or web address].   Also download the MyChart app! Go to the app store, search "MyChart", open the app, select [facility], and log in with your MyChart username and password.  Due to Covid, a mask is required upon entering the hospital/clinic. If you do not have a mask, one will be given to you upon arrival. For doctor visits, patients may have 1 support person aged 69 or older with them. For treatment visits, patients cannot have anyone with them due to current Covid guidelines and our immunocompromised population.

## 2021-10-26 ENCOUNTER — Telehealth (HOSPITAL_COMMUNITY): Payer: Self-pay | Admitting: Physical Therapy

## 2021-10-26 ENCOUNTER — Encounter (HOSPITAL_COMMUNITY): Payer: Federal, State, Local not specified - PPO | Admitting: Physical Therapy

## 2021-10-26 NOTE — Telephone Encounter (Signed)
Called patient about missed appt today and reminder of upcoming visit 11/16. Left VM.   10:19 AM, 10/26/21 Josue Hector PT DPT  Physical Therapist with [facility]  [phone]

## 2021-10-27 DIAGNOSIS — K7581 Nonalcoholic steatohepatitis (NASH): Secondary | ICD-10-CM | POA: Diagnosis not present

## 2021-10-27 DIAGNOSIS — I10 Essential (primary) hypertension: Secondary | ICD-10-CM | POA: Diagnosis not present

## 2021-10-27 DIAGNOSIS — E1140 Type 2 diabetes mellitus with diabetic neuropathy, unspecified: Secondary | ICD-10-CM | POA: Diagnosis not present

## 2021-10-27 DIAGNOSIS — I5030 Unspecified diastolic (congestive) heart failure: Secondary | ICD-10-CM | POA: Diagnosis not present

## 2021-10-27 DIAGNOSIS — J683 Other acute and subacute respiratory conditions due to chemicals, gases, fumes and vapors: Secondary | ICD-10-CM | POA: Diagnosis not present

## 2021-10-27 DIAGNOSIS — M1991 Primary osteoarthritis, unspecified site: Secondary | ICD-10-CM | POA: Diagnosis not present

## 2021-10-27 DIAGNOSIS — I25118 Atherosclerotic heart disease of native coronary artery with other forms of angina pectoris: Secondary | ICD-10-CM | POA: Diagnosis not present

## 2021-10-27 DIAGNOSIS — J45909 Unspecified asthma, uncomplicated: Secondary | ICD-10-CM | POA: Diagnosis not present

## 2021-10-27 DIAGNOSIS — Z6837 Body mass index (BMI) 37.0-37.9, adult: Secondary | ICD-10-CM | POA: Diagnosis not present

## 2021-10-27 DIAGNOSIS — E6609 Other obesity due to excess calories: Secondary | ICD-10-CM | POA: Diagnosis not present

## 2021-10-27 DIAGNOSIS — I5022 Chronic systolic (congestive) heart failure: Secondary | ICD-10-CM | POA: Diagnosis not present

## 2021-10-28 ENCOUNTER — Ambulatory Visit (HOSPITAL_COMMUNITY): Payer: Federal, State, Local not specified - PPO | Admitting: Physical Therapy

## 2021-10-30 ENCOUNTER — Ambulatory Visit: Payer: Federal, State, Local not specified - PPO | Admitting: Cardiology

## 2021-11-02 ENCOUNTER — Inpatient Hospital Stay (HOSPITAL_COMMUNITY): Payer: Federal, State, Local not specified - PPO

## 2021-11-02 ENCOUNTER — Other Ambulatory Visit: Payer: Self-pay

## 2021-11-02 DIAGNOSIS — D509 Iron deficiency anemia, unspecified: Secondary | ICD-10-CM

## 2021-11-02 DIAGNOSIS — Z79899 Other long term (current) drug therapy: Secondary | ICD-10-CM | POA: Diagnosis not present

## 2021-11-02 DIAGNOSIS — D696 Thrombocytopenia, unspecified: Secondary | ICD-10-CM | POA: Diagnosis not present

## 2021-11-02 LAB — CBC
HCT: 35.2 % — ABNORMAL LOW (ref 39.0–52.0)
Hemoglobin: 12.3 g/dL — ABNORMAL LOW (ref 13.0–17.0)
MCH: 33.1 pg (ref 26.0–34.0)
MCHC: 34.9 g/dL (ref 30.0–36.0)
MCV: 94.6 fL (ref 80.0–100.0)
Platelets: 66 10*3/uL — ABNORMAL LOW (ref 150–400)
RBC: 3.72 MIL/uL — ABNORMAL LOW (ref 4.22–5.81)
RDW: 17.4 % — ABNORMAL HIGH (ref 11.5–15.5)
WBC: 4.7 10*3/uL (ref 4.0–10.5)
nRBC: 0 % (ref 0.0–0.2)

## 2021-11-03 ENCOUNTER — Encounter (HOSPITAL_COMMUNITY): Payer: Self-pay | Admitting: Physical Therapy

## 2021-11-03 ENCOUNTER — Ambulatory Visit (HOSPITAL_COMMUNITY): Payer: Federal, State, Local not specified - PPO | Admitting: Physical Therapy

## 2021-11-03 DIAGNOSIS — M6281 Muscle weakness (generalized): Secondary | ICD-10-CM

## 2021-11-03 DIAGNOSIS — R2681 Unsteadiness on feet: Secondary | ICD-10-CM

## 2021-11-03 NOTE — Therapy (Signed)
[facility] [address] [place], [place], [address] Phone: [phone]   Fax:  [phone]  Physical Therapy Treatment and Discharge Note  Patient Details  Name: [name] MRN: [id number] Date of Birth: [birth year] Referring Provider (PT): Dr. [name]  PHYSICAL THERAPY DISCHARGE SUMMARY  Visits from Start of Care: 5  Current functional level related to goals / functional outcomes: See below    Remaining deficits: See below  Education / Equipment: See below  Patient agrees to discharge. Patient goals were partially met. Patient is being discharged due to  too many medical apts at this time.  Encounter Date: 11/03/2021   PT End of Session - 11/03/21 1149     Visit Number 5    Number of Visits 12    Date for PT Re-Evaluation 11/17/21    Authorization Type BCBS 75 VL with 61 left PT/OT/SP, Medicare as secondary.    Authorization - Visit Number 5    Authorization - Number of Visits 61    Progress Note Due on Visit 10    PT Start Time 1150   pt late to session and then answered phone call   PT Stop Time 1213    PT Time Calculation (min) 23 min    Equipment Utilized During Treatment Gait belt    Activity Tolerance Patient limited by fatigue    Behavior During Therapy WFL for tasks assessed/performed             Past Medical History:  Diagnosis Date   Anemia    Arthritis    CAD (coronary artery disease)    Multivessel disease status post CABG 08/2015   Cirrhosis (HCC)    CKD (chronic kidney disease) stage 3, GFR 30-59 ml/min (HCC)    Diastolic CHF ([place])    Essential hypertension    Hyperlipidemia    Iron deficiency anemia 07/15/2021   OSA on CPAP    Polyclonal gammopathy    Thrombocytopenia (HCC) 2016   Type 2 diabetes mellitus ([place])     Past Surgical History:  Procedure Laterality Date   APPENDECTOMY     Biceps tendon surgery Right    BIOPSY  11/22/2019   Procedure: BIOPSY;  Surgeon: Daneil Dolin, MD;  [facility];  Service: Endoscopy;;   CARDIAC CATHETERIZATION N/A 08/25/2015   Procedure: Left Heart Cath and Coronary Angiography;  Surgeon: Belva Crome, MD;  Location: [facility];  Service: Cardiovascular;  Laterality: N/A;   COLONOSCOPY WITH PROPOFOL N/A 11/22/2019   Procedure: COLONOSCOPY WITH PROPOFOL;  Surgeon: Daneil Dolin, MD; Four 4-5 mm polyps, findings suggestive of portal colopathy, congested appearing colonic mucosa diffusely, rectal varices, and adequate right colon prep.  Pathology with tubular adenomas and hyperplastic polyp.  Right colon biopsy with focal active colitis.  Recommendations to repeat colonoscopy in 3 months due to poor prep.   COLONOSCOPY WITH PROPOFOL N/A 03/17/2020   Procedure: COLONOSCOPY WITH PROPOFOL;  Surgeon: Daneil Dolin, MD;  [facility];  Service: Endoscopy;  Laterality: N/A;  8:45am - pt does not need covid test, was + 2/4 <90 days   CORONARY ARTERY BYPASS GRAFT N/A 08/29/2015   Procedure: CORONARY ARTERY BYPASS GRAFTING (CABG);  Surgeon: [name], MD;  Location: [facility];  Service: Open Heart Surgery;  Laterality: N/A;   ESOPHAGEAL BANDING N/A 07/23/2021   Procedure: ESOPHAGEAL BANDING;  Surgeon: Eloise Harman, DO;  [facility];  Service: Endoscopy;  Laterality: N/A;   ESOPHAGOGASTRODUODENOSCOPY (EGD) WITH PROPOFOL N/A 11/22/2019   Procedure: ESOPHAGOGASTRODUODENOSCOPY (EGD) WITH PROPOFOL;  Surgeon: Daneil Dolin, MD; 4 columns of grade 2-3 esophageal varices, portal gastropathy, gastric polyp/abnormal gastric mucosa s/p biopsy.  Pathology with hyperplastic polyp, mild chronic gastritis, negative H. pylori.   ESOPHAGOGASTRODUODENOSCOPY (EGD) WITH PROPOFOL N/A 07/23/2021   Procedure: ESOPHAGOGASTRODUODENOSCOPY (EGD) WITH PROPOFOL;  Surgeon: Eloise Harman, DO;  [facility];  Service: Endoscopy;  Laterality: N/A;   KNEE ARTHROSCOPY Left    TEE WITHOUT CARDIOVERSION N/A  08/29/2015   Procedure: TRANSESOPHAGEAL ECHOCARDIOGRAM (TEE);  Surgeon: [name], MD;  Location: [facility];  Service: Open Heart Surgery;  Laterality: N/A;   TOTAL KNEE ARTHROPLASTY Left 03/09/2017   Procedure: TOTAL KNEE ARTHROPLASTY;  Surgeon: Carole Civil, MD;  Location: [facility];  Service: Orthopedics;  Laterality: Left;    There were no vitals filed for this visit.   Subjective Assessment - 11/03/21 1150     Subjective States he is feeling better. Overall feels about 90% better.    Pertinent History CHF, DB, CABG x4, Kidney disease, hx of hypotension, awaiting liver transplant and RTKA,    Currently in Pain? Yes    Pain Score 3     Pain Location Knee    Pain Orientation Right    Pain Descriptors / Indicators Aching                [facility] PT Assessment - 11/03/21 0001       Assessment   Medical Diagnosis weakness and falls      Observation/Other Assessments   Focus on Therapeutic Outcomes (FOTO)  64% function, predicted 73% function, original was 63% function.      Transfers   Five time sit to stand comments  15.46   no UE use     Ambulation/Gait   Ambulation/Gait Yes    Ambulation/Gait Assistance 5: Supervision    Ambulation Distance (Feet) 282 Feet    Assistive device None    Gait Pattern Decreased stride length;Right foot flat;Left foot flat;Scissoring;Trunk flexed;Wide base of support;Decreased trunk rotation    Ambulation Surface Level;Indoor    Gait velocity reduced    Gait Comments 2MW      Static Standing Balance   Static Standing - Balance Support No upper extremity supported    Static Standing - Level of Assistance 5: Stand by assistance    Static Standing Balance -  Activities  Single Leg Stance - Right Leg;Single Leg Stance - Left Leg    Static Standing - Comment/# of Minutes L 9 R 13 seconds                                    PT Education - 11/03/21 1200     Education Details reviewed progress, HEP and current  presentation.    Person(s) Educated Patient    Methods Explanation    Comprehension Verbalized understanding              PT Short Term Goals - 11/03/21 1151       PT SHORT TERM GOAL #1   Title Patient will be able to perform 5x STS in <18 seconds to demonstrate reduced fall risk    Baseline 15.46 seconds    Time 3    Period Weeks    Status Achieved    Target Date 10/27/21      PT  SHORT TERM GOAL #2   Title Patient will report at least 25% improvement in overall symptoms and/or function to demonstrate improved functional mobility    Time 3    Period Weeks    Status Achieved    Target Date 10/27/21      PT SHORT TERM GOAL #3   Title Patient will be independent in self management strategies to improve quality of life and functional outcomes.    Time 3    Period Weeks    Status Achieved    Target Date 10/27/21               PT Long Term Goals - 11/03/21 1153       PT LONG TERM GOAL #1   Title Patient will be able to stand on either leg for at least 20 seconds without UE support to demonstrate improved static balance.    Time 6    Period Weeks    Status Not Met      PT LONG TERM GOAL #2   Title Patient will report at least 50% improvement in overall symptoms and/or function to demonstrate improved functional mobility    Baseline 90%    Time 6    Period Weeks    Status Achieved      PT LONG TERM GOAL #3   Title Patient to be able to ambulate 361ft during 2MWT with least restrictive AD in order to improve mobilty and overall community access    Time 6    Period Weeks    Status Not Met                   Plan - 11/03/21 1154     Clinical Impression Statement Patient has met all short term goals and has met 1/3 long term goals. Reviewed HEP and answered all questions. Patient to discharge from PT secondary to having too many medical appointments at this time. Encouraged patient to return to therapy once he can focus on PT.    Personal Factors and  Comorbidities Comorbidity 1;Comorbidity 3+;Comorbidity 2;Age;Fitness    Comorbidities CHF, DB, CABG x4, Kidney disease, hx of hypotension    Examination-Activity Limitations Locomotion Level;Transfers;Stand;Stairs;Squat;Bathing;Dressing    Examination-Participation Restrictions Meal Prep;Yard Work;Community Activity;Shop;Cleaning    Stability/Clinical Decision Making Evolving/Moderate complexity    Rehab Potential Good    PT Frequency 2x / week    PT Duration 6 weeks    PT Treatment/Interventions ADLs/Self Care Home Management;Cryotherapy;Electrical Stimulation;Moist Heat;Traction;Balance training;Therapeutic exercise;Therapeutic activities;Manual techniques;Stair training;Gait training;DME Instruction;Neuromuscular re-education;Patient/family education;Passive range of motion    PT Next Visit Plan DC to HEP.    PT Home Exercise Plan STS no UE, hip abduction, hip extension, heelraises; 11/10: tandem stance, sidestep with GTB, squat front of chair    Consulted and Agree with Plan of Care Patient             Patient will benefit from skilled therapeutic intervention in order to improve the following deficits and impairments:  Pain, Decreased strength, Decreased activity tolerance, Decreased balance, Difficulty walking, Decreased mobility, Decreased knowledge of use of DME  Visit Diagnosis: Muscle weakness (generalized)  Unsteadiness on feet     Problem List Patient Active Problem List   Diagnosis Date Noted   Muscle spasm 08/14/2021   CAD (coronary artery disease) 08/14/2021   HLD (hyperlipidemia) 08/14/2021   Chronic idiopathic thrombocytopenia ([place]) 08/14/2021   Increased ammonia level 08/14/2021   Obese 08/14/2021   Fall at home/Back Pain and Ambulatory Dysfunction 08/14/2021  Hypotension due to hypovolemia 08/14/2021   Hypokalemia 80/88/1103   Acute metabolic encephalopathy 15/94/5859   Chronic kidney disease 07/21/2021   Pure hypercholesterolemia 07/21/2021   GI bleed  07/21/2021   Iron deficiency anemia 07/15/2021   Hyperkalemia 03/17/2021   Elevated troponin 03/17/2021   Back pain 03/17/2021   Hypertensive heart and kidney disease with heart failure and with chronic kidney disease stage III ([place]) 12/17/2020   Chronic diastolic heart failure ([place]) 12/17/2020   CKD stage 3 due to type 2 diabetes mellitus ([place]) 12/17/2020   Hyperlipidemia associated with type 2 diabetes mellitus ([place]) 12/17/2020   Anxiety 12/17/2020   Chronic non-seasonal allergic rhinitis 12/17/2020   Hypotension 12/10/2020   Acute renal failure superimposed on stage 3b chronic kidney disease ([place]) 12/10/2020   Acute renal failure superimposed on stage 3b chronic kidney disease, unspecified acute renal failure type ([place]) 12/10/2020   AKI (acute kidney injury) ([place]) 12/09/2020   GERD (gastroesophageal reflux disease) 10/09/2020   Portal hypertension ([place]) 08/26/2020   Pre-transplant evaluation for liver transplant 08/26/2020   Atherosclerotic heart disease of native coronary artery without angina pectoris 07/17/2020   NASH cirrhosis of liver ([place]) 07/17/2020   Anasarca 07/17/2020   Heart failure, unspecified ([place]) 07/17/2020   Morbid obesity ([place]) 07/17/2020   Acute on chronic heart failure with preserved ejection fraction (HCC)    Liver failure ([place]) 06/20/2020   Esophageal varices ([place]) 01/09/2020   History of adenomatous polyp of colon 01/09/2020   Elevated AST (SGOT) 08/23/2019   Elevation of level of transaminase and lactic acid dehydrogenase (LDH) 08/23/2019   Diarrhea 07/17/2019   Hypersomnia, persistent 03/12/2019   Macrocytic anemia 01/18/2019   Other pancytopenia ([place]) 01/18/2019   Anemia 01/18/2019   Thrombocytopenia ([place]) 12/30/2018   Polyclonal gammopathy 12/30/2018   S/P total knee replacement, left 03/09/17 11/22/2017   Primary osteoarthritis of left knee 03/09/2017   History of nocturia 03/02/2017   OSA on CPAP 03/02/2016   Hypoxia 03/02/2016   Obstructive  sleep apnea (adult) (pediatric) 03/02/2016   Cyst of mediastinum 01/15/2016   S/P CABG x 4 08/29/2015   Obesity, Class III, BMI 40-49.9 (morbid obesity) ([place])    Hyperglycemia    Pain in the chest 08/23/2015   Non-ST elevation MI (NSTEMI) ([place]) 08/23/2015   Diabetes mellitus type 2, uncontrolled 08/23/2015   Essential hypertension 08/23/2015   Hyperlipidemia 08/23/2015   Type 2 diabetes mellitus with hypoglycemia with coma ([place]) 08/23/2015   Myocardial infarction ([place]) 08/23/2015   Type 2 diabetes mellitus with hyperglycemia ([place]) 2016   Class 2 obesity 2016   Hyponatremia 2016   Osteoarthritis, knee 05/24/2012   Knee pain 10/29/2011   Knee stiffness 10/29/2011   S/P right knee arthroscopy 10/26/2011   Medial meniscus, posterior horn derangement 09/07/2011   Lateral meniscus derangement 09/07/2011   OA (osteoarthritis) of knee 09/07/2011   Rotator cuff syndrome of left shoulder 08/19/2011   Right knee meniscal tear 08/19/2011   12:16 PM, 11/03/21 Jerene Pitch, DPT Physical Therapy with [facility]  [phone] office   [facility] [address] [place], [place], [address] Phone: [phone]   Fax:  [phone]  Name: [name] MRN: [id number] Date of Birth: [birth year]

## 2021-11-04 ENCOUNTER — Encounter (HOSPITAL_COMMUNITY): Payer: Federal, State, Local not specified - PPO

## 2021-11-04 ENCOUNTER — Encounter: Payer: Self-pay | Admitting: Internal Medicine

## 2021-11-04 ENCOUNTER — Ambulatory Visit (INDEPENDENT_AMBULATORY_CARE_PROVIDER_SITE_OTHER): Payer: Federal, State, Local not specified - PPO | Admitting: Internal Medicine

## 2021-11-04 ENCOUNTER — Other Ambulatory Visit: Payer: Self-pay

## 2021-11-04 VITALS — BP 124/66 | HR 74 | Ht 70.5 in | Wt 254.0 lb

## 2021-11-04 DIAGNOSIS — Z951 Presence of aortocoronary bypass graft: Secondary | ICD-10-CM

## 2021-11-04 DIAGNOSIS — I2510 Atherosclerotic heart disease of native coronary artery without angina pectoris: Secondary | ICD-10-CM | POA: Diagnosis not present

## 2021-11-04 DIAGNOSIS — R0789 Other chest pain: Secondary | ICD-10-CM

## 2021-11-04 DIAGNOSIS — K7460 Unspecified cirrhosis of liver: Secondary | ICD-10-CM

## 2021-11-04 DIAGNOSIS — R188 Other ascites: Secondary | ICD-10-CM

## 2021-11-04 MED ORDER — NITROGLYCERIN 0.4 MG SL SUBL: 0.4000 mg | SUBLINGUAL_TABLET | SUBLINGUAL | 3 refills | Status: DC | PRN

## 2021-11-04 NOTE — Progress Notes (Signed)
Cardiology Office Note    Date:  11/04/2021   ID:  Kazumi, Lachney 08-03-1961, MRN [id number]  PCP:  [name], MD  Cardiologist: Rozann Lesches, MD    CC: Follow-up hospitalization for chest pain.   History of Present Illness:    [name] is a 60 y.o. male with past medical history of CAD (s/p CABG in 2016), HFpEF, aortic stenosis, hepatic cirrhosis, adrenal insufficiency, HTN, HLD, Type 2 DM, Stage 3 CKD, OSA, anemia, thrombocytopenia and polyclonal gammopathy who presents to the office today for 6-month follow-up.   He was examined by Dr. Domenic Polite in 06/2021 and reported a gradual weight gain of over 10 pounds within the past few months. He denied any recent anginal symptoms and recent PET myocardial perfusion study earlier that year had shown only mild peri-infarct ischemia and medical management was recommended. He was encouraged to take extra Torsemide for the next 2 to 3 days and resume his baseline dose.   By review of notes, he was admitted to [facility] in 07/2021 for GI bleeding and symptomatic anemia and was found to have grade 2 esophageal varices by EGD performed during admission. It was recommended that he be on twice daily PPI therapy for [redacted] weeks along with Carafate. He was again admitted from 8/30 - 08/25/2021 for hypotension in the setting of electrolyte imbalances and dehydration. Torsemide was reduced to 20 mg daily at the time of discharge along with Spironolactone being reduced to 12.5 mg daily.  He was continued on Nadolol 20 mg daily and Midodrine 10 mg 3 times daily.  11/04/2021  [name] is seen today in ER follow-up.  He was just seen in the emergency department for chest pain.  This was right-sided and somewhat atypical.  He ruled out for MI with negative troponins.  He reports that that is dissipated and he has had no recurrent chest pain.  Blood pressure actually looks good today 124/66.  He denies any worsening shortness of  breath.  He is still on the liver transplant list at [facility].  He is under a lot of stress and has often office visits for that.  He is status post CABG in 2016 and has not had any repeat coronary assessment since then.  As his symptoms are very atypical though however I did not recommend an ischemia evaluation today.   Past Medical History:  Diagnosis Date   Anemia    Arthritis    CAD (coronary artery disease)    Multivessel disease status post CABG 08/2015   Cirrhosis (HCC)    CKD (chronic kidney disease) stage 3, GFR 30-59 ml/min (HCC)    Diastolic CHF (HCC)    Essential hypertension    Hyperlipidemia    Iron deficiency anemia 07/15/2021   OSA on CPAP    Polyclonal gammopathy    Thrombocytopenia (HCC) 2016   Type 2 diabetes mellitus ([place])     Past Surgical History:  Procedure Laterality Date   APPENDECTOMY     Biceps tendon surgery Right    BIOPSY  11/22/2019   Procedure: BIOPSY;  Surgeon: Daneil Dolin, MD;  [facility];  Service: Endoscopy;;   CARDIAC CATHETERIZATION N/A 08/25/2015   Procedure: Left Heart Cath and Coronary Angiography;  Surgeon: Belva Crome, MD;  Location: [place] [facility];  Service: Cardiovascular;  Laterality: N/A;   COLONOSCOPY WITH PROPOFOL N/A 11/22/2019   Procedure: COLONOSCOPY WITH PROPOFOL;  Surgeon: Daneil Dolin, MD; Four 4-5 mm  polyps, findings suggestive of portal colopathy, congested appearing colonic mucosa diffusely, rectal varices, and adequate right colon prep.  Pathology with tubular adenomas and hyperplastic polyp.  Right colon biopsy with focal active colitis.  Recommendations to repeat colonoscopy in 3 months due to poor prep.   COLONOSCOPY WITH PROPOFOL N/A 03/17/2020   Procedure: COLONOSCOPY WITH PROPOFOL;  Surgeon: Daneil Dolin, MD;  [facility];  Service: Endoscopy;  Laterality: N/A;  8:45am - pt does not need covid test, was + 2/4 <90 days   CORONARY ARTERY BYPASS GRAFT N/A 08/29/2015   Procedure: CORONARY  ARTERY BYPASS GRAFTING (CABG);  Surgeon: [name], MD;  Location: [place];  Service: Open Heart Surgery;  Laterality: N/A;   ESOPHAGEAL BANDING N/A 07/23/2021   Procedure: ESOPHAGEAL BANDING;  Surgeon: Eloise Harman, DO;  [facility];  Service: Endoscopy;  Laterality: N/A;   ESOPHAGOGASTRODUODENOSCOPY (EGD) WITH PROPOFOL N/A 11/22/2019   Procedure: ESOPHAGOGASTRODUODENOSCOPY (EGD) WITH PROPOFOL;  Surgeon: Daneil Dolin, MD; 4 columns of grade 2-3 esophageal varices, portal gastropathy, gastric polyp/abnormal gastric mucosa s/p biopsy.  Pathology with hyperplastic polyp, mild chronic gastritis, negative H. pylori.   ESOPHAGOGASTRODUODENOSCOPY (EGD) WITH PROPOFOL N/A 07/23/2021   Procedure: ESOPHAGOGASTRODUODENOSCOPY (EGD) WITH PROPOFOL;  Surgeon: Eloise Harman, DO;  [facility];  Service: Endoscopy;  Laterality: N/A;   KNEE ARTHROSCOPY Left    TEE WITHOUT CARDIOVERSION N/A 08/29/2015   Procedure: TRANSESOPHAGEAL ECHOCARDIOGRAM (TEE);  Surgeon: [name], MD;  Location: [place];  Service: Open Heart Surgery;  Laterality: N/A;   TOTAL KNEE ARTHROPLASTY Left 03/09/2017   Procedure: TOTAL KNEE ARTHROPLASTY;  Surgeon: Carole Civil, MD;  Location: [facility];  Service: Orthopedics;  Laterality: Left;    Current Medications: Outpatient Medications Prior to Visit  Medication Sig Dispense Refill   acetaminophen (TYLENOL) 325 MG tablet Take 650 mg by mouth every 6 (six) hours as needed for mild pain.     albuterol (VENTOLIN HFA) 108 (90 Base) MCG/ACT inhaler Inhale 2 puffs into the lungs every 6 (six) hours as needed for wheezing or shortness of breath. 18 g 0   ALPRAZolam (XANAX) 0.5 MG tablet Take 1 tablet (0.5 mg total) by mouth daily as needed. 10 tablet 0   aspirin EC 81 MG tablet Take 1 tablet (81 mg total) by mouth daily with breakfast. 30 tablet 11   Cholecalciferol (DIALYVITE VITAMIN D 5000) 125 MCG (5000 UT) capsule Take 5,000 Units by mouth in  the morning.      Continuous Blood Gluc Sensor (FREESTYLE LIBRE 14 DAY SENSOR) MISC SMARTSIG:1 Each Topical Every 2 Weeks     ferrous sulfate 325 (65 FE) MG tablet Take 1 tablet (325 mg total) by mouth daily with breakfast. 30 tablet 3   insulin aspart (NOVOLOG) 100 UNIT/ML injection Inject 5 Units into the skin 3 (three) times daily before meals. Special Instructions: If accu-check is greater than 150. Hold for accu-check 150 or below. With Meals 10 mL 0   insulin degludec (TRESIBA FLEXTOUCH) 200 UNIT/ML FlexTouch Pen Inject 40 Units into the skin in the morning. Give 30 units Subcutaneous  at Bedtime 8:00 pm (Patient taking differently: Inject 30 Units into the skin in the morning. Give 30 units Subcutaneous  at Bedtime 8:00 pm) 15 mL 0   lactulose (CHRONULAC) 10 GM/15ML solution Take 30 mLs (20 g total) by mouth daily. 900 mL 0   lidocaine (XYLOCAINE) 2 % solution 5 mLs as needed.  loratadine (CLARITIN) 10 MG tablet Take 10 mg by mouth in the morning.      magnesium oxide (MAG-OX) 400 (240 Mg) MG tablet Take 1 tablet (400 mg total) by mouth daily. 30 tablet 2   midodrine (PROAMATINE) 10 MG tablet Take 1 tablet (10 mg total) by mouth 3 (three) times daily with meals. 90 tablet 5   nadolol (CORGARD) 20 MG tablet Take by mouth daily.     NOVOLOG FLEXPEN 100 UNIT/ML FlexPen Inject into the skin.     OZEMPIC, 0.25 OR 0.5 MG/DOSE, 2 MG/1.5ML SOPN Inject into the skin.     OZEMPIC, 1 MG/DOSE, 4 MG/3ML SOPN Inject 1 mg into the skin once a week. 3 mL 0   pantoprazole (PROTONIX) 40 MG tablet Take 1 tablet (40 mg total) by mouth daily. 30 tablet 5   potassium chloride (KLOR-CON) 10 MEQ tablet Take 1 tablet (10 mEq total) by mouth daily. Take While taking Torsemide 30 tablet 2   pravastatin (PRAVACHOL) 20 MG tablet TAKE 1 TABLET(20 MG) BY MOUTH AT BEDTIME 30 tablet 0   PULMICORT FLEXHALER 180 MCG/ACT inhaler Inhale into the lungs.     rifaximin (XIFAXAN) 550 MG TABS tablet Take 1 tablet (550 mg total)  by mouth 2 (two) times daily. 60 tablet 5   spironolactone (ALDACTONE) 25 MG tablet Take 0.5 tablets (12.5 mg total) by mouth daily. 30 tablet 3   torsemide (DEMADEX) 20 MG tablet Take 1 tablet (20 mg total) by mouth daily. 30 tablet 1   TRESIBA FLEXTOUCH 100 UNIT/ML FlexTouch Pen Inject into the skin.     venlafaxine XR (EFFEXOR-XR) 75 MG 24 hr capsule Take 1 capsule (75 mg total) by mouth daily with breakfast. 30 capsule 3   venlafaxine XR (EFFEXOR-XR) 37.5 MG 24 hr capsule Take 37.5 mg by mouth daily. (Patient not taking: Reported on 11/04/2021)     venlafaxine XR (EFFEXOR-XR) 37.5 MG 24 hr capsule Take by mouth. (Patient not taking: Reported on 11/04/2021)     No facility-administered medications prior to visit.     Allergies:   Fluticasone   Social History   Socioeconomic History   Marital status: Married    Spouse name: Not on file   Number of children: 0   Years of education: college   Highest education level: Not on file  Occupational History   Occupation: Maintenance tech    Employer: [employer]  Tobacco Use   Smoking status: Never   Smokeless tobacco: Never  Vaping Use   Vaping Use: Never used  Substance and Sexual Activity   Alcohol use: Not Currently   Drug use: No   Sexual activity: Not Currently  Other Topics Concern   Not on file  Social History Narrative   Not on file   Social Determinants of Health   Financial Resource Strain: Not on file  Food Insecurity: Not on file  Transportation Needs: Not on file  Physical Activity: Not on file  Stress: Not on file  Social Connections: Not on file     Family History:  The patient's family history includes ALS in his mother; Aneurysm in his maternal grandmother; Arthritis in an other family member; CAD in his brother and father; Cancer in his maternal grandfather and another family member; Diabetes in an other family member; Diabetes Mellitus II in his brother, brother, father, maternal grandmother, and  sister; Hodgkin's lymphoma in his father; Liver cancer in his father.   Review of Systems:    Please see  the history of present illness.     All other systems reviewed and are otherwise negative except as noted above.   Physical Exam:    VS:  BP 124/66   Pulse 74   Ht 5' 10.5" (1.791 m)   Wt 254 lb (115.2 kg)   SpO2 98%   BMI 35.93 kg/m    General: Well developed, well nourished,male appearing in no acute distress. Head: Normocephalic, atraumatic. Neck: No carotid bruits. JVD not elevated.  Lungs: Respirations regular and unlabored, without wheezes or rales.  Heart: Regular rate and rhythm. No S3 or S4.  3/6 SEM along RUSB.  Abdomen: Appears non-distended. No obvious abdominal masses. Msk:  Strength and tone appear normal for age. No obvious joint deformities or effusions. Extremities: No clubbing or cyanosis. Trace lower extremity edema.  Distal pedal pulses are 2+ bilaterally. Neuro: Alert and oriented X 3. Moves all extremities spontaneously. No focal deficits noted. Psych:  Responds to questions appropriately with a normal affect. Skin: No rashes or lesions noted  Wt Readings from Last 3 Encounters:  11/04/21 254 lb (115.2 kg)  10/18/21 260 lb (117.9 kg)  10/13/21 254 lb 12.8 oz (115.6 kg)    Studies/Labs Reviewed:   EKG:  EKG is not ordered today.    Recent Labs: 02/02/2021: TSH 1.901 07/28/2021: B Natriuretic Peptide 351.0 08/22/2021: Magnesium 1.9 10/18/2021: ALT 19; BUN 18; Creatinine, Ser 1.12; Potassium 3.6; Sodium 133 11/02/2021: Hemoglobin 12.3; Platelets 66   Lipid Panel    Component Value Date/Time   CHOL 120 05/25/2019 1001   TRIG 136 05/25/2019 1001   HDL 38 (L) 05/25/2019 1001   CHOLHDL 3.2 05/25/2019 1001   VLDL 27 05/25/2019 1001   LDLCALC 55 05/25/2019 1001    Additional studies/ records that were reviewed today include:   Cardiac Catheterization: 08/2015 Mid RCA to Dist RCA lesion, 100% stenosed. 1st Mrg lesion, 90% stenosed. Ramus  lesion, 70% stenosed. Ost Cx to Dist Cx lesion, 60% stenosed. Ost 1st Diag lesion, 90% stenosed. Ost 2nd Diag to 2nd Diag lesion, 70% stenosed. Mid LAD lesion, 90% stenosed. Post Atrio lesion, 90% stenosed. Dist RCA lesion, 100% stenosed.   Severe native vessel coronary artery disease with total occlusion of the distal RCA (likely culprit for acute presentation), high-grade obstruction in the proximal to mid LAD, high-grade obstruction in the first obtuse marginal, significant stenosis in the ramus intermedius, first diagonal, and second diagonal. LVEF 50% with marked elevation in LVEDP, consistent with acute diastolic left heart failure. Distal right coronary is collateralized from the LAD and circumflex. The right coronary is dominant.     RECOMMENDATIONS:   TCTS consultation for consideration of surgical revascularization. Continue aspirin. No Plavix. In TIMI IV heparin.   Echocardiogram: 05/2020 IMPRESSIONS     1. Left ventricular ejection fraction, by estimation, is 60 to 65%. The  left ventricle has normal function. The left ventricle has no regional  wall motion abnormalities. The left ventricular internal cavity size was  mildly dilated. There is mild left  ventricular hypertrophy. Left ventricular diastolic parameters are  indeterminate.   2. Right ventricular systolic function is normal. The right ventricular  size is normal. There is normal pulmonary artery systolic pressure.   3. The mitral valve is abnormal. Mild mitral valve regurgitation.   4. AV is thickened, calcifieid Difficult to see well. Peak and mean  gradients through the valve are 31 and 18 mm Hg respectively consistent  with mild AS. Compared to echo report from 2019, gradients  are increased.  . The aortic valve is abnormal. Aortic  valve regurgitation is not visualized. Mild aortic valve stenosis.   5. The inferior vena cava is dilated in size with <50% respiratory  variability, suggesting right atrial  pressure of 15 mmHg.   Echocardiogram: 12/2020 NORMAL LEFT VENTRICULAR SYSTOLIC FUNCTION    NORMAL RIGHT VENTRICULAR SYSTOLIC FUNCTION    NO VALVULAR REGURGITATION    VALVULAR STENOSIS: MILD AS    POOR SOUND TRANSMISSION   Assessment:    No diagnosis found.    Plan:   In order of problems listed above:  1.  Chest pain/CAD - He is s/p CABG in 2016 and he did undergo a PET myocardial perfusion study in 01/2021 at [facility] which showed inferior wall scar and mild peri-infarct ischemia with medical management recommended at that time.  -Recent ER visit for chest pain sounded atypical, right-sided.  Troponins negative.  Given recent perfusion testing at [facility], would not recommend additional testing at this time.  Would titrate medical therapy if his symptoms recur.  2. HFpEF - He has lost over 30 pounds within the past 3 months with dietary changes and reports his breathing has been stable. He only has trace edema on examination today and lungs are clear. - Continue Torsemide 20 mg daily along with Spironolactone 12.5 mg daily. He is listed as taking both Benicar and HCTZ but both of these were discontinued during prior admissions given hypotension. I did encourage him to check his medications upon returning home to make sure he is no longer taking these. He does remain on Midodrine 10 mg TID for BP support in the setting of adrenal insufficiency.   3. Aortic Stenosis - This was mild by repeat echocardiogram at [facility] in 12/2020. Will continue to follow.  4. Stage 3 CKD - His creatinine was stable at 1.0 - 1.1 during his recent admission.   5. Hepatic Cirrhosis - He is being followed by [facility] with continued plans for eventual liver transplant. His fluid status overall remains stable at this time. He does remain on Lactulose, Nadolol, Spironolactone and Torsemide.  6. OSA - He does use a CPAP on a nightly basis and continued compliance with this was encouraged.  Follow-up in 6 months with  Dr. Domenic Polite or Bernerd Pho, PA-C.  Pixie Casino, MD, [facility], [place] Director of the Advanced Lipid Disorders &  Cardiovascular Risk Reduction Clinic Diplomate of the American Board of Clinical Lipidology Attending Cardiologist  Direct Dial: [phone]  Fax: [phone]  Website:  [email or web address]  Pixie Casino, MD  11/04/2021 9:19 PM

## 2021-11-04 NOTE — Patient Instructions (Signed)
Medication Instructions:  NITROGLYCERIN UNDER THE TONGUE AS NEEDED EVERY 5 MINS FOR CHEST PAIN. DO NOT EXCEED 3 DOSES IN 15 MINS    NITROGLYCERIN  is a type of vasodilator. It relaxes blood vessels, increasing the blood and oxygen supply to your heart. This medicine is used to relieve chest pain caused by angina.  In an angina attack (CHEST PAIN), you should feel better within 5 minutes after your first dose. Do not swallow whole. Place tablet under your tongue. Sit down when taking this medicine. You can take a dose every 5 minutes up to a total of 3 doses. If you do not feel better or feel worse after 1 dose, call 9-1-1 at once. Do not take more than 3 doses in 15 minutes. Do not take your medicine more often than directed.  *If you need a refill on your cardiac medications before your next appointment, please call your pharmacy*  Follow-Up: At [facility], you and your health needs are our priority.  As part of our continuing mission to provide you with exceptional heart care, we have created designated Provider Care Teams.  These Care Teams include your primary Cardiologist (physician) and Advanced Practice Providers (APPs -  Physician Assistants and Nurse Practitioners) who all work together to provide you with the care you need, when you need it.  Your next appointment:   AS SCHEDULED with Bernerd Pho PA-C  }

## 2021-11-09 ENCOUNTER — Encounter (HOSPITAL_COMMUNITY): Payer: Federal, State, Local not specified - PPO | Admitting: Physical Therapy

## 2021-11-09 ENCOUNTER — Ambulatory Visit: Payer: Federal, State, Local not specified - PPO | Admitting: Orthopedic Surgery

## 2021-11-10 ENCOUNTER — Encounter: Payer: Self-pay | Admitting: Orthopedic Surgery

## 2021-11-11 ENCOUNTER — Encounter (HOSPITAL_COMMUNITY): Payer: Federal, State, Local not specified - PPO | Admitting: Physical Therapy

## 2021-11-17 ENCOUNTER — Encounter (HOSPITAL_COMMUNITY): Payer: Federal, State, Local not specified - PPO | Admitting: Physical Therapy

## 2021-11-19 ENCOUNTER — Other Ambulatory Visit: Payer: Self-pay

## 2021-11-19 ENCOUNTER — Encounter: Payer: Self-pay | Admitting: Orthopedic Surgery

## 2021-11-19 ENCOUNTER — Encounter (HOSPITAL_COMMUNITY): Payer: Federal, State, Local not specified - PPO | Admitting: Physical Therapy

## 2021-11-19 ENCOUNTER — Ambulatory Visit: Payer: Federal, State, Local not specified - PPO

## 2021-11-19 ENCOUNTER — Ambulatory Visit (INDEPENDENT_AMBULATORY_CARE_PROVIDER_SITE_OTHER): Payer: Federal, State, Local not specified - PPO | Admitting: Orthopedic Surgery

## 2021-11-19 VITALS — BP 149/74 | HR 72 | Ht 70.5 in | Wt 255.0 lb

## 2021-11-19 DIAGNOSIS — G8929 Other chronic pain: Secondary | ICD-10-CM

## 2021-11-19 DIAGNOSIS — S46012A Strain of muscle(s) and tendon(s) of the rotator cuff of left shoulder, initial encounter: Secondary | ICD-10-CM

## 2021-11-19 DIAGNOSIS — M25512 Pain in left shoulder: Secondary | ICD-10-CM

## 2021-11-19 DIAGNOSIS — I2510 Atherosclerotic heart disease of native coronary artery without angina pectoris: Secondary | ICD-10-CM

## 2021-11-19 NOTE — Progress Notes (Signed)
Chief Complaint  Patient presents with   Shoulder Pain    Left / fell in August     HPI: 60 year old male with diabetes and liver disease needs liver transplant pending presents with pain in his left shoulder since a fall in August.  He has a history of pain in his shoulder dating back to 2019 and was also seen in February 2022  His chronic shoulder pain is now worse after the fall.  The patient was with his walker and backed up and fell onto his left side.  Since that time is noticed pain in the front of the shoulder and pain when he tries to move his arm  Past Medical History:  Diagnosis Date   Anemia    Arthritis    CAD (coronary artery disease)    Multivessel disease status post CABG 08/2015   Cirrhosis ([place])    CKD (chronic kidney disease) stage 3, GFR 30-59 ml/min (HCC)    Diastolic CHF ([place])    Essential hypertension    Hyperlipidemia    Iron deficiency anemia 07/15/2021   OSA on CPAP    Polyclonal gammopathy    Thrombocytopenia ([place]) 2016   Type 2 diabetes mellitus (HCC)     BP (!) 149/74   Pulse 72   Ht 5' 10.5" (1.791 m)   Wt 255 lb (115.7 kg)   BMI 36.07 kg/m    General appearance: Well-developed well-nourished no gross deformities  Cardiovascular normal pulse and perfusion normal color without edema  Neurologically no sensation loss or deficits or pathologic reflexes  Psychological: Awake alert and oriented x3 mood and affect normal  Skin no lacerations or ulcerations no nodularity no palpable masses, no erythema or nodularity  Musculoskeletal:  Left shoulder is tender over the anterior joint line no tenderness posteriorly he has active flexion of 120 degrees he has weakness in abduction and flexion to manual muscle testing.  The shoulder is otherwise stable  Imaging x-rays of the left shoulder show proximal migration of the humerus consistent with chronic rotator cuff disease if not tear  A/P  The patient is scheduled for transplant at some point so  avoid any surgery at this time  Inject left shoulder and start home exercise program for strengthening  Procedure note the subacromial injection shoulder left   Verbal consent was obtained to inject the  Left   Shoulder  Timeout was completed to confirm the injection site is a subacromial space of the  left  shoulder  Medication used Depo-Medrol 40 mg and lidocaine 1% 3 cc  Anesthesia was provided by ethyl chloride  The injection was performed in the left  posterior subacromial space. After pinning the skin with alcohol and anesthetized the skin with ethyl chloride the subacromial space was injected using a 20-gauge needle. There were no complications  Sterile dressing was applied.

## 2021-11-28 NOTE — Progress Notes (Signed)
[address] [place], [place] [address]   CLINIC:  Medical Oncology/Hematology  PCP:  [name], [place] [address] [phone]   REASON FOR VISIT:  Follow-up for iron deficiency anemia and thrombocytopenia  CURRENT THERAPY: Intermittent IV iron infusions  INTERVAL HISTORY:  [name] 60 y.o. male returns for routine follow-up of his anemia and thrombocytopenia in the setting of liver cirrhosis.  He received IV Venofer 300 mg x 3 doses from 10/05/2021 through 10/23/2021.  At today's visit, he reports feeling well.  Since his last visit, he has not had any hospitalizations, surgeries, or changes in baseline health status.  He denies any obvious bleeding events such as epistaxis, hematemesis, hematochezia, melena, or hematuria.  He reports easy bruising but denies petechial rash.  He denies any B symptoms such as fever, chills, night sweats, unintentional weight loss.  He reports that his energy is much better, but he still has some mild persistent fatigue with energy about 80%.  He denies any pica, restless legs, headaches, chest pain, dyspnea on exertion, lightheadedness, or syncope.  He reports 100% appetite, but reports that he is intentionally losing weight in order to improve his eligibility for liver transplant.   REVIEW OF SYSTEMS:  Review of Systems  Constitutional:  Positive for fatigue (mild). Negative for appetite change, chills, diaphoresis, fever and unexpected weight change.  HENT:   Negative for lump/mass and nosebleeds.   Eyes:  Negative for eye problems.  Respiratory:  Negative for cough, hemoptysis and shortness of breath.   Cardiovascular:  Negative for chest pain, leg swelling and palpitations.  Gastrointestinal:  Negative for abdominal pain, blood in stool, constipation, diarrhea, nausea and vomiting.  Genitourinary:  Negative for hematuria.   Skin: Negative.   Neurological:  Negative for  dizziness, headaches and light-headedness.  Hematological:  Does not bruise/bleed easily.     PAST MEDICAL/SURGICAL HISTORY:  Past Medical History:  Diagnosis Date   Anemia    Arthritis    CAD (coronary artery disease)    Multivessel disease status post CABG 08/2015   Cirrhosis (HCC)    CKD (chronic kidney disease) stage 3, GFR 30-59 ml/min (HCC)    Diastolic CHF (HCC)    Essential hypertension    Hyperlipidemia    Iron deficiency anemia 07/15/2021   OSA on CPAP    Polyclonal gammopathy    Thrombocytopenia (HCC) 2016   Type 2 diabetes mellitus ([facility])    Past Surgical History:  Procedure Laterality Date   APPENDECTOMY     Biceps tendon surgery Right    BIOPSY  11/22/2019   Procedure: BIOPSY;  Surgeon: Daneil Dolin, MD;  [facility];  Service: Endoscopy;;   CARDIAC CATHETERIZATION N/A 08/25/2015   Procedure: Left Heart Cath and Coronary Angiography;  Surgeon: Belva Crome, MD;  Location: [facility];  Service: Cardiovascular;  Laterality: N/A;   COLONOSCOPY WITH PROPOFOL N/A 11/22/2019   Procedure: COLONOSCOPY WITH PROPOFOL;  Surgeon: Daneil Dolin, MD; Four 4-5 mm polyps, findings suggestive of portal colopathy, congested appearing colonic mucosa diffusely, rectal varices, and adequate right colon prep.  Pathology with tubular adenomas and hyperplastic polyp.  Right colon biopsy with focal active colitis.  Recommendations to repeat colonoscopy in 3 months due to poor prep.   COLONOSCOPY WITH PROPOFOL N/A 03/17/2020   Procedure: COLONOSCOPY WITH PROPOFOL;  Surgeon: Daneil Dolin, MD;  [facility];  Service: Endoscopy;  Laterality: N/A;  8:45am -  pt does not need covid test, was + 2/4 <90 days   CORONARY ARTERY BYPASS GRAFT N/A 08/29/2015   Procedure: CORONARY ARTERY BYPASS GRAFTING (CABG);  Surgeon: [name], MD;  Location: [facility];  Service: Open Heart Surgery;  Laterality: N/A;   ESOPHAGEAL BANDING N/A 07/23/2021   Procedure: ESOPHAGEAL  BANDING;  Surgeon: Eloise Harman, DO;  [facility];  Service: Endoscopy;  Laterality: N/A;   ESOPHAGOGASTRODUODENOSCOPY (EGD) WITH PROPOFOL N/A 11/22/2019   Procedure: ESOPHAGOGASTRODUODENOSCOPY (EGD) WITH PROPOFOL;  Surgeon: Daneil Dolin, MD; 4 columns of grade 2-3 esophageal varices, portal gastropathy, gastric polyp/abnormal gastric mucosa s/p biopsy.  Pathology with hyperplastic polyp, mild chronic gastritis, negative H. pylori.   ESOPHAGOGASTRODUODENOSCOPY (EGD) WITH PROPOFOL N/A 07/23/2021   Procedure: ESOPHAGOGASTRODUODENOSCOPY (EGD) WITH PROPOFOL;  Surgeon: Eloise Harman, DO;  [facility];  Service: Endoscopy;  Laterality: N/A;   KNEE ARTHROSCOPY Left    TEE WITHOUT CARDIOVERSION N/A 08/29/2015   Procedure: TRANSESOPHAGEAL ECHOCARDIOGRAM (TEE);  Surgeon: [name], MD;  Location: [facility];  Service: Open Heart Surgery;  Laterality: N/A;   TOTAL KNEE ARTHROPLASTY Left 03/09/2017   Procedure: TOTAL KNEE ARTHROPLASTY;  Surgeon: Carole Civil, MD;  Location: [facility];  Service: Orthopedics;  Laterality: Left;     SOCIAL HISTORY:  Social History   Socioeconomic History   Marital status: Married    Spouse name: Not on file   Number of children: 0   Years of education: college   Highest education level: Not on file  Occupational History   Occupation: Maintenance tech    Employer: [employer]  Tobacco Use   Smoking status: Never   Smokeless tobacco: Never  Vaping Use   Vaping Use: Never used  Substance and Sexual Activity   Alcohol use: Not Currently   Drug use: No   Sexual activity: Not Currently  Other Topics Concern   Not on file  Social History Narrative   Not on file   Social Determinants of Health   Financial Resource Strain: Not on file  Food Insecurity: Not on file  Transportation Needs: Not on file  Physical Activity: Not on file  Stress: Not on file  Social Connections: Not on file  Intimate Partner Violence: Not  on file    FAMILY HISTORY:  Family History  Problem Relation Age of Onset   Arthritis Other    Cancer Other    Diabetes Other    CAD Father    Diabetes Mellitus II Father    Liver cancer Father    Hodgkin's lymphoma Father    CAD Brother    Diabetes Mellitus II Brother    ALS Mother    Diabetes Mellitus II Sister    Diabetes Mellitus II Brother    Diabetes Mellitus II Maternal Grandmother    Aneurysm Maternal Grandmother    Cancer Maternal Grandfather    Anesthesia problems Neg Hx    Hypotension Neg Hx    Malignant hyperthermia Neg Hx    Pseudochol deficiency Neg Hx    Colon cancer Neg Hx     CURRENT MEDICATIONS:  Outpatient Encounter Medications as of 11/30/2021  Medication Sig   acetaminophen (TYLENOL) 325 MG tablet Take 650 mg by mouth every 6 (six) hours as needed for mild pain.   albuterol (VENTOLIN HFA) 108 (90 Base) MCG/ACT inhaler Inhale 2 puffs into the lungs every 6 (six) hours as needed for wheezing or shortness of breath.   ALPRAZolam (XANAX) 0.5 MG  tablet Take 1 tablet (0.5 mg total) by mouth daily as needed.   aspirin EC 81 MG tablet Take 1 tablet (81 mg total) by mouth daily with breakfast.   Cholecalciferol (DIALYVITE VITAMIN D 5000) 125 MCG (5000 UT) capsule Take 5,000 Units by mouth in the morning.    Continuous Blood Gluc Sensor (FREESTYLE LIBRE 14 DAY SENSOR) MISC SMARTSIG:1 Each Topical Every 2 Weeks   ferrous sulfate 325 (65 FE) MG tablet Take 1 tablet (325 mg total) by mouth daily with breakfast.   insulin aspart (NOVOLOG) 100 UNIT/ML injection Inject 5 Units into the skin 3 (three) times daily before meals. Special Instructions: If accu-check is greater than 150. Hold for accu-check 150 or below. With Meals   insulin degludec (TRESIBA FLEXTOUCH) 200 UNIT/ML FlexTouch Pen Inject 40 Units into the skin in the morning. Give 30 units Subcutaneous  at Bedtime 8:00 pm (Patient taking differently: Inject 30 Units into the skin in the morning. Give 30 units  Subcutaneous  at Bedtime 8:00 pm)   lactulose (CHRONULAC) 10 GM/15ML solution Take 30 mLs (20 g total) by mouth daily.   lidocaine (XYLOCAINE) 2 % solution 5 mLs as needed.   loratadine (CLARITIN) 10 MG tablet Take 10 mg by mouth in the morning.    magnesium oxide (MAG-OX) 400 (240 Mg) MG tablet Take 1 tablet (400 mg total) by mouth daily.   midodrine (PROAMATINE) 10 MG tablet Take 1 tablet (10 mg total) by mouth 3 (three) times daily with meals.   nadolol (CORGARD) 20 MG tablet Take by mouth daily.   nitroGLYCERIN (NITROSTAT) 0.4 MG SL tablet Place 1 tablet (0.4 mg total) under the tongue every 5 (five) minutes as needed for chest pain. Do not exceed 3 doses in 15 mins   NOVOLOG FLEXPEN 100 UNIT/ML FlexPen Inject into the skin.   OZEMPIC, 0.25 OR 0.5 MG/DOSE, 2 MG/1.5ML SOPN Inject into the skin.   OZEMPIC, 1 MG/DOSE, 4 MG/3ML SOPN Inject 1 mg into the skin once a week.   pantoprazole (PROTONIX) 40 MG tablet Take 1 tablet (40 mg total) by mouth daily.   potassium chloride (KLOR-CON) 10 MEQ tablet Take 1 tablet (10 mEq total) by mouth daily. Take While taking Torsemide   pravastatin (PRAVACHOL) 20 MG tablet TAKE 1 TABLET(20 MG) BY MOUTH AT BEDTIME   PULMICORT FLEXHALER 180 MCG/ACT inhaler Inhale into the lungs.   rifaximin (XIFAXAN) 550 MG TABS tablet Take 1 tablet (550 mg total) by mouth 2 (two) times daily.   spironolactone (ALDACTONE) 25 MG tablet Take 0.5 tablets (12.5 mg total) by mouth daily.   torsemide (DEMADEX) 20 MG tablet Take 1 tablet (20 mg total) by mouth daily.   TRESIBA FLEXTOUCH 100 UNIT/ML FlexTouch Pen Inject into the skin.   venlafaxine XR (EFFEXOR-XR) 37.5 MG 24 hr capsule Take 37.5 mg by mouth daily.   venlafaxine XR (EFFEXOR-XR) 37.5 MG 24 hr capsule Take by mouth.   venlafaxine XR (EFFEXOR-XR) 75 MG 24 hr capsule Take 1 capsule (75 mg total) by mouth daily with breakfast.   No facility-administered encounter medications on file as of 11/30/2021.    ALLERGIES:   Allergies  Allergen Reactions   Fluticasone Rash     PHYSICAL EXAM:  ECOG PERFORMANCE STATUS: 1 - Symptomatic but completely ambulatory  There were no vitals filed for this visit. There were no vitals filed for this visit. Physical Exam Constitutional:      Appearance: Normal appearance. He is obese.  HENT:  Head: Normocephalic and atraumatic.     Mouth/Throat:     Mouth: Mucous membranes are moist.  Eyes:     Extraocular Movements: Extraocular movements intact.     Pupils: Pupils are equal, round, and reactive to light.  Cardiovascular:     Rate and Rhythm: Normal rate and regular rhythm.     Pulses: Normal pulses.     Heart sounds: Normal heart sounds.  Pulmonary:     Effort: Pulmonary effort is normal.     Breath sounds: Normal breath sounds.  Abdominal:     General: Bowel sounds are normal.     Palpations: Abdomen is soft.     Tenderness: There is no abdominal tenderness.  Musculoskeletal:        General: No swelling.     Right lower leg: Edema (1+, chronic) present.     Left lower leg: Edema (trace, chronic) present.  Lymphadenopathy:     Cervical: No cervical adenopathy.  Skin:    General: Skin is warm and dry.  Neurological:     General: No focal deficit present.     Mental Status: He is alert and oriented to person, place, and time.  Psychiatric:        Mood and Affect: Mood normal.        Behavior: Behavior normal.     LABORATORY DATA:  I have reviewed the labs as listed.  CBC    Component Value Date/Time   WBC 4.7 11/02/2021 1019   RBC 3.72 (L) 11/02/2021 1019   HGB 12.3 (L) 11/02/2021 1019   HGB 12.5 (L) 05/03/2019 1005   HGB 10.1 (L) 12/15/2018 1525   HCT 35.2 (L) 11/02/2021 1019   HCT 30.2 (L) 12/15/2018 1525   PLT 66 (L) 11/02/2021 1019   PLT 72 (L) 05/03/2019 1005   PLT 87 (LL) 12/15/2018 1525   MCV 94.6 11/02/2021 1019   MCV 84 12/15/2018 1525   MCH 33.1 11/02/2021 1019   MCHC 34.9 11/02/2021 1019   RDW 17.4 (H) 11/02/2021 1019    RDW 15.1 12/15/2018 1525   LYMPHSABS 1.0 10/18/2021 0250   LYMPHSABS 1.5 12/15/2018 1525   MONOABS 0.7 10/18/2021 0250   EOSABS 0.1 10/18/2021 0250   EOSABS 0.1 12/15/2018 1525   BASOSABS 0.0 10/18/2021 0250   BASOSABS 0.0 12/15/2018 1525   CMP Latest Ref Rng & Units 10/18/2021 10/15/2021 10/15/2021  Glucose 70 - 99 mg/dL 241(H) 256(H) -  BUN 8 - 23 mg/dL 18 16 -  Creatinine 0.61 - 1.24 mg/dL 1.12 1.08 -  Sodium 135 - 145 mmol/L 133(L) 133(L) -  Potassium 3.5 - 5.1 mmol/L 3.6 4.3 -  Chloride 98 - 111 mmol/L 100 103 -  CO2 22 - 32 mmol/L 26 24 -  Calcium 8.9 - 10.3 mg/dL 9.2 9.1 9.3  Total Protein 6.5 - 8.1 g/dL 7.2 - -  Total Bilirubin 0.3 - 1.2 mg/dL 1.0 - -  Alkaline Phos 38 - 126 U/L 95 - -  AST 15 - 41 U/L 45(H) - -  ALT 0 - 44 U/L 19 - -    DIAGNOSTIC IMAGING:  I have independently reviewed the relevant imaging and discussed with the patient.  ASSESSMENT & PLAN: 1.   Moderate thrombocytopenia, secondary to cirrhosis/splenomegaly -Thrombocytopenia since 2016.  Platelets between 55-90 since 2018. - BMBX on 01/26/2019 showed slightly hypercellular marrow for age with trilineage hematopoiesis.  No significant dyspoiesis or increase in blasts.  Plasma cells are slightly increased in number but with polyclonal  staining pattern.  Abundant megakaryocytes with scattered small hypolobulated forms.  Chromosome analysis was normal.  Biopsy was done for work-up of increased IgA levels. - Ultrasound abdomen on 01/15/2020 showed nodular contour of the liver consistent with cirrhosis.  Splenomegaly with volume of 1562 mL. -Thrombocytopenia secondary to splenomegaly from cirrhosis - CBC today (11/30/2021): Platelets 81 - He admits to easy bruising but denies petechial rash or major bleeding events - PLAN: Platelets stable at baseline.  No treatment indicated at this time.  We will continue to monitor.  If platelets drop to < 30,000 would consider referral for partial splenic embolization.   2.   Normocytic to macrocytic anemia: - Combination anemia from CKD, iron deficiency, and intermittent GI bleeding - Recent hospitalization from 07/21/2021 through 07/25/2021: Presented to ED with Hgb 6.7 and admitted for acute blood loss anemia.  Patient had EGD on 07/23/2021 with banding of grade 2 esophageal varices.  He was transfused PRBC x1 on 07/22/2021. - Colonoscopy on 03/17/2020 showed inadequate preparation of the colon with rectal varices.  Much of the colon could not be seen. - EGD (06/18/2021) at [facility]: Grade 2 esophageal varices s/p banding; gastric antral vascular ectasia without bleeding, portal hypertensive gastropathy - EGD (07/23/2021): Two columns of grade 2 varices s/p banding; moderate portal hypertensive gastropathy with small amount of fresh blood in this region, but without any active oozing; moderate gastric antral vascular ectasia without bleeding in gastric antrum. - Most recent blood transfusion with PRBC x1 on 07/22/2021 - Most recent IV iron with Venofer on 10/23/2021 - Denies any signs or symptoms of gross rectal hemorrhage or melena, even during his episode of acute blood loss anemia   -Fatigue has improved after most recent IV iron infusion - Labs today (11/30/2021): Hgb 13.6, ferritin 151, iron saturation 32% - Differential diagnosis favors anemia secondary to iron deficiency, suspect chronic GI blood loss in the setting of cirrhosis and esophageal varices; macrocytosis secondary to liver disease; element of mild CKD may also be contributing to his anemia  - PLAN: No indication for IV iron at this time. - Repeat labs and RTC in 3 months.  Patient is aware of alarm symptoms that would prompt more immediate medical attention. - Continue follow-up with GI for ongoing management of cirrhosis and esophageal varices.  3.  Positive rheumatoid factor: -Work-up for thrombocytopenia showed rheumatoid factor positive at 58.6.  ANA was negative. -He does not have any clinical signs or  symptoms of rheumatoid arthritis.   4.  Adrenal insufficiency: - He was hospitalized in the first week of April 2022 with adrenal insufficiency. - Was placed on hydrocortisone 20 mg twice daily and midodrine 15 mg 3 times daily - His blood pressure is well maintained at this time.   5.  Liver cirrhosis - He follows with [facility] and is undergoing evaluation to be put on transplant list    PLAN SUMMARY & DISPOSITION: Labs in 3 months RTC after labs  All questions were answered. The patient knows to call the clinic with any problems, questions or concerns.  Medical decision making: Moderate  Time spent on visit: I spent 20 minutes counseling the patient face to face. The total time spent in the appointment was 30 minutes and more than 50% was on counseling.   Harriett Rush, PA-C  11/30/2021 5:04 PM

## 2021-11-30 ENCOUNTER — Inpatient Hospital Stay (HOSPITAL_COMMUNITY): Payer: Federal, State, Local not specified - PPO | Attending: Hematology

## 2021-11-30 ENCOUNTER — Inpatient Hospital Stay (HOSPITAL_BASED_OUTPATIENT_CLINIC_OR_DEPARTMENT_OTHER): Payer: Federal, State, Local not specified - PPO | Admitting: Physician Assistant

## 2021-11-30 ENCOUNTER — Other Ambulatory Visit: Payer: Self-pay

## 2021-11-30 VITALS — BP 114/62 | HR 58 | Temp 97.9°F | Resp 16 | Ht 70.5 in | Wt 246.3 lb

## 2021-11-30 DIAGNOSIS — D696 Thrombocytopenia, unspecified: Secondary | ICD-10-CM | POA: Insufficient documentation

## 2021-11-30 DIAGNOSIS — E2740 Unspecified adrenocortical insufficiency: Secondary | ICD-10-CM | POA: Diagnosis not present

## 2021-11-30 DIAGNOSIS — G4733 Obstructive sleep apnea (adult) (pediatric): Secondary | ICD-10-CM | POA: Diagnosis not present

## 2021-11-30 DIAGNOSIS — Z808 Family history of malignant neoplasm of other organs or systems: Secondary | ICD-10-CM | POA: Insufficient documentation

## 2021-11-30 DIAGNOSIS — Z809 Family history of malignant neoplasm, unspecified: Secondary | ICD-10-CM | POA: Diagnosis not present

## 2021-11-30 DIAGNOSIS — D509 Iron deficiency anemia, unspecified: Secondary | ICD-10-CM

## 2021-11-30 DIAGNOSIS — N183 Chronic kidney disease, stage 3 unspecified: Secondary | ICD-10-CM | POA: Diagnosis not present

## 2021-11-30 DIAGNOSIS — K766 Portal hypertension: Secondary | ICD-10-CM | POA: Diagnosis not present

## 2021-11-30 DIAGNOSIS — E785 Hyperlipidemia, unspecified: Secondary | ICD-10-CM | POA: Insufficient documentation

## 2021-11-30 DIAGNOSIS — Z8249 Family history of ischemic heart disease and other diseases of the circulatory system: Secondary | ICD-10-CM | POA: Diagnosis not present

## 2021-11-30 DIAGNOSIS — R5383 Other fatigue: Secondary | ICD-10-CM | POA: Diagnosis not present

## 2021-11-30 DIAGNOSIS — Z79899 Other long term (current) drug therapy: Secondary | ICD-10-CM | POA: Insufficient documentation

## 2021-11-30 DIAGNOSIS — Z8261 Family history of arthritis: Secondary | ICD-10-CM | POA: Diagnosis not present

## 2021-11-30 DIAGNOSIS — Z800 Family history of malignant neoplasm of digestive organs: Secondary | ICD-10-CM | POA: Diagnosis not present

## 2021-11-30 DIAGNOSIS — Z7682 Awaiting organ transplant status: Secondary | ICD-10-CM | POA: Insufficient documentation

## 2021-11-30 DIAGNOSIS — Z833 Family history of diabetes mellitus: Secondary | ICD-10-CM | POA: Diagnosis not present

## 2021-11-30 DIAGNOSIS — I2510 Atherosclerotic heart disease of native coronary artery without angina pectoris: Secondary | ICD-10-CM | POA: Insufficient documentation

## 2021-11-30 DIAGNOSIS — I5032 Chronic diastolic (congestive) heart failure: Secondary | ICD-10-CM | POA: Insufficient documentation

## 2021-11-30 DIAGNOSIS — K7460 Unspecified cirrhosis of liver: Secondary | ICD-10-CM | POA: Insufficient documentation

## 2021-11-30 DIAGNOSIS — Z9049 Acquired absence of other specified parts of digestive tract: Secondary | ICD-10-CM | POA: Diagnosis not present

## 2021-11-30 LAB — CBC WITH DIFFERENTIAL/PLATELET
Abs Immature Granulocytes: 0.02 10*3/uL (ref 0.00–0.07)
Basophils Absolute: 0 10*3/uL (ref 0.0–0.1)
Basophils Relative: 1 %
Eosinophils Absolute: 0.1 10*3/uL (ref 0.0–0.5)
Eosinophils Relative: 2 %
HCT: 39.9 % (ref 39.0–52.0)
Hemoglobin: 13.6 g/dL (ref 13.0–17.0)
Immature Granulocytes: 0 %
Lymphocytes Relative: 22 %
Lymphs Abs: 1.2 10*3/uL (ref 0.7–4.0)
MCH: 33.6 pg (ref 26.0–34.0)
MCHC: 34.1 g/dL (ref 30.0–36.0)
MCV: 98.5 fL (ref 80.0–100.0)
Monocytes Absolute: 0.6 10*3/uL (ref 0.1–1.0)
Monocytes Relative: 11 %
Neutro Abs: 3.6 10*3/uL (ref 1.7–7.7)
Neutrophils Relative %: 64 %
Platelets: 81 10*3/uL — ABNORMAL LOW (ref 150–400)
RBC: 4.05 MIL/uL — ABNORMAL LOW (ref 4.22–5.81)
RDW: 16.7 % — ABNORMAL HIGH (ref 11.5–15.5)
WBC: 5.6 10*3/uL (ref 4.0–10.5)
nRBC: 0 % (ref 0.0–0.2)

## 2021-11-30 LAB — IRON AND TIBC
Iron: 104 ug/dL (ref 45–182)
Saturation Ratios: 32 % (ref 17.9–39.5)
TIBC: 327 ug/dL (ref 250–450)
UIBC: 223 ug/dL

## 2021-11-30 LAB — FERRITIN: Ferritin: 151 ng/mL (ref 24–336)

## 2021-11-30 NOTE — Patient Instructions (Signed)
[facility] at [facility] Discharge Instructions  You were seen today by Tarri Abernethy PA-C for the following conditions.  LOW PLATELETS: Your platelets remain mildly low, but at their usual baseline due to your underlying cirrhosis and enlarged liver.  You do not need any treatment for your low platelets at this time, but you should be aware that you are at increased risk of bleeding events.  IRON DEFICIENCY ANEMIA: Your blood and iron levels looked excellent at today's visit!  You do not need any IV iron at this time.  However, you are always at a risk of recurrent anemia due to your underlying risk factors for GI bleeding.  Please seek immediate medical attention if you notice any bright red blood or dark black bowel movements in the toilet.  You should also let us know if you begin to experience symptoms of anemia such as worsening fatigue, shortness of breath with exertion, chest pain, or feeling like you may pass out.  LABS: Return in 3 months for repeat labs  OTHER TESTS: Continue follow-up with GI as previously scheduled  MEDICATIONS: No changes to home medications  FOLLOW-UP APPOINTMENT: Office visit in 3 months, after labs   Thank you for choosing [place] at [facility] to provide your oncology and hematology care.  To afford each patient quality time with our provider, please arrive at least 15 minutes before your scheduled appointment time.   If you have a lab appointment with the [facility] please come in thru the Main Entrance and check in at the main information desk.  You need to re-schedule your appointment should you arrive 10 or more minutes late.  We strive to give you quality time with our providers, and arriving late affects you and other patients whose appointments are after yours.  Also, if you no show three or more times for appointments you may be dismissed from the clinic at the providers discretion.     Again,  thank you for choosing [facility].  Our hope is that these requests will decrease the amount of time that you wait before being seen by our physicians.       _____________________________________________________________  Should you have questions after your visit to [facility], please contact our office at [phone] and follow the prompts.  Our office hours are 8:00 a.m. and 4:30 p.m. Monday - Friday.  Please note that voicemails left after 4:00 p.m. may not be returned until the following business day.  We are closed weekends and major holidays.  You do have access to a nurse 24-7, just call the main number to the clinic [phone] and do not press any options, hold on the line and a nurse will answer the phone.    For prescription refill requests, have your pharmacy contact our office and allow 72 hours.    Due to Covid, you will need to wear a mask upon entering the hospital. If you do not have a mask, a mask will be given to you at the Main Entrance upon arrival. For doctor visits, patients may have 1 support person age 28 or older with them. For treatment visits, patients can not have anyone with them due to social distancing guidelines and our immunocompromised population.

## 2021-12-17 ENCOUNTER — Emergency Department (HOSPITAL_COMMUNITY)
Admission: EM | Admit: 2021-12-17 | Discharge: 2021-12-17 | Disposition: A | Discharge status: Home / Self Care | Payer: Federal, State, Local not specified - PPO | Attending: Emergency Medicine | Admitting: Emergency Medicine

## 2021-12-17 DIAGNOSIS — Z7982 Long term (current) use of aspirin: Secondary | ICD-10-CM | POA: Insufficient documentation

## 2021-12-17 DIAGNOSIS — Z79899 Other long term (current) drug therapy: Secondary | ICD-10-CM | POA: Diagnosis not present

## 2021-12-17 DIAGNOSIS — Y92410 Unspecified street and highway as the place of occurrence of the external cause: Secondary | ICD-10-CM | POA: Insufficient documentation

## 2021-12-17 DIAGNOSIS — R5381 Other malaise: Secondary | ICD-10-CM | POA: Diagnosis not present

## 2021-12-17 DIAGNOSIS — I2510 Atherosclerotic heart disease of native coronary artery without angina pectoris: Secondary | ICD-10-CM | POA: Diagnosis not present

## 2021-12-17 DIAGNOSIS — E1122 Type 2 diabetes mellitus with diabetic chronic kidney disease: Secondary | ICD-10-CM | POA: Diagnosis not present

## 2021-12-17 DIAGNOSIS — I5030 Unspecified diastolic (congestive) heart failure: Secondary | ICD-10-CM | POA: Diagnosis not present

## 2021-12-17 DIAGNOSIS — Z23 Encounter for immunization: Secondary | ICD-10-CM | POA: Insufficient documentation

## 2021-12-17 DIAGNOSIS — Z794 Long term (current) use of insulin: Secondary | ICD-10-CM | POA: Diagnosis not present

## 2021-12-17 DIAGNOSIS — N183 Chronic kidney disease, stage 3 unspecified: Secondary | ICD-10-CM | POA: Insufficient documentation

## 2021-12-17 DIAGNOSIS — S4991XA Unspecified injury of right shoulder and upper arm, initial encounter: Secondary | ICD-10-CM | POA: Diagnosis not present

## 2021-12-17 DIAGNOSIS — S59911A Unspecified injury of right forearm, initial encounter: Secondary | ICD-10-CM | POA: Diagnosis not present

## 2021-12-17 DIAGNOSIS — Z743 Need for continuous supervision: Secondary | ICD-10-CM | POA: Diagnosis not present

## 2021-12-17 DIAGNOSIS — S51811A Laceration without foreign body of right forearm, initial encounter: Secondary | ICD-10-CM | POA: Diagnosis not present

## 2021-12-17 DIAGNOSIS — S50811A Abrasion of right forearm, initial encounter: Secondary | ICD-10-CM | POA: Diagnosis not present

## 2021-12-17 DIAGNOSIS — T148XXA Other injury of unspecified body region, initial encounter: Secondary | ICD-10-CM

## 2021-12-17 DIAGNOSIS — I130 Hypertensive heart and chronic kidney disease with heart failure and stage 1 through stage 4 chronic kidney disease, or unspecified chronic kidney disease: Secondary | ICD-10-CM | POA: Diagnosis not present

## 2021-12-17 DIAGNOSIS — T1490XA Injury, unspecified, initial encounter: Secondary | ICD-10-CM | POA: Diagnosis not present

## 2021-12-17 MED ORDER — MUPIROCIN CALCIUM 2 % EX CREA
TOPICAL_CREAM | Freq: Two times a day (BID) | CUTANEOUS | Status: DC
  Administered 2021-12-17: 1 via TOPICAL
  Filled 2021-12-17: qty 15

## 2021-12-17 MED ORDER — TETANUS-DIPHTH-ACELL PERTUSSIS 5-2.5-18.5 LF-MCG/0.5 IM SUSY
0.5000 mL | PREFILLED_SYRINGE | Freq: Once | INTRAMUSCULAR | Status: AC
  Administered 2021-12-17: 0.5 mL via INTRAMUSCULAR
  Filled 2021-12-17: qty 0.5
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
## 2021-12-17 NOTE — Discharge Instructions (Signed)
Please use Bactroban over your abrasion twice a day until healed. You can wash with soap and water. Observe for signs of infection such as redness surrounding wound, fevers, or purulent drainage (yellowish). Please return if you notice these symptoms.
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
## 2021-12-22 ENCOUNTER — Encounter: Payer: Self-pay | Admitting: Family Medicine

## 2021-12-24 DIAGNOSIS — M2391 Unspecified internal derangement of right knee: Secondary | ICD-10-CM | POA: Diagnosis not present

## 2021-12-24 DIAGNOSIS — Z6839 Body mass index (BMI) 39.0-39.9, adult: Secondary | ICD-10-CM | POA: Diagnosis not present

## 2021-12-24 DIAGNOSIS — M1711 Unilateral primary osteoarthritis, right knee: Secondary | ICD-10-CM | POA: Diagnosis not present

## 2021-12-31 ENCOUNTER — Ambulatory Visit: Payer: Federal, State, Local not specified - PPO

## 2022-01-06 ENCOUNTER — Ambulatory Visit: Payer: Federal, State, Local not specified - PPO | Admitting: Orthopedic Surgery

## 2022-01-07 ENCOUNTER — Encounter: Payer: Self-pay | Admitting: Orthopedic Surgery

## 2022-01-07 ENCOUNTER — Other Ambulatory Visit: Payer: Self-pay

## 2022-01-07 ENCOUNTER — Ambulatory Visit (INDEPENDENT_AMBULATORY_CARE_PROVIDER_SITE_OTHER): Payer: Federal, State, Local not specified - PPO | Admitting: Orthopedic Surgery

## 2022-01-07 DIAGNOSIS — Z96652 Presence of left artificial knee joint: Secondary | ICD-10-CM | POA: Diagnosis not present

## 2022-01-07 MED ORDER — TRAMADOL HCL 50 MG PO TABS: 50.0000 mg | ORAL_TABLET | Freq: Four times a day (QID) | ORAL | 5 refills | Status: DC | PRN
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
## 2022-01-12 NOTE — Progress Notes (Signed)
Cardiology Office Note    Date:  01/13/2022   ID:  Christopher, [name] 1961-04-10, MRN [id number]  PCP:  [name], MD  Cardiologist: Rozann Lesches, MD    Chief Complaint  Patient presents with   Follow-up    3 month visit    History of Present Illness:    [name] is a 61 y.o. male with past medical history of CAD (s/p CABG in 2016, PET Perfusion study in 01/2021 showing inferior scar and mild ischemia with medical management recommended), HFpEF, aortic stenosis, hepatic cirrhosis, adrenal insufficiency, HTN, HLD, Type 2 DM, Stage 3 CKD, OSA, anemia, thrombocytopenia and polyclonal gammopathy who presents to the office today for 24-month follow-up.   He was examined by Dr. Debara Pickett in 10/2021 for follow-up from a recent Emergency Dept visit for chest pain but enzymes had been negative at that time and symptoms were felt to be atypical for a cardiac etiology. He denied any recurrent pain and given his recent reassuring stress test, further testing was not arranged and it was recommended to titrate medical therapy if he developed recurrent symptoms.    In talking with the patient today, he reports overall doing well from a cardiac perspective since his last office visit. He denies any recurrent chest pain. Reports his breathing has been stable and denies any orthopnea, PND or pitting edema. He continues to take Torsemide 20 mg once daily and does not have to take extra doses routinely. Says his blood pressure has been well controlled at home and the lowest his SBP has been over the past few weeks is 105 but he denies any associated lightheadedness or dizziness when this occurs.   Past Medical History:  Diagnosis Date   Anemia    Arthritis    CAD (coronary artery disease)    Multivessel disease status post CABG 08/2015   Cirrhosis (HCC)    CKD (chronic kidney disease) stage 3, GFR 30-59 ml/min (HCC)    Diastolic CHF (HCC)    Essential hypertension    Hyperlipidemia     Iron deficiency anemia 07/15/2021   OSA on CPAP    Polyclonal gammopathy    Thrombocytopenia (HCC) 2016   Type 2 diabetes mellitus ([place])     Past Surgical History:  Procedure Laterality Date   APPENDECTOMY     Biceps tendon surgery Right    BIOPSY  11/22/2019   Procedure: BIOPSY;  Surgeon: Daneil Dolin, MD;  [facility];  Service: Endoscopy;;   CARDIAC CATHETERIZATION N/A 08/25/2015   Procedure: Left Heart Cath and Coronary Angiography;  Surgeon: Belva Crome, MD;  Location: [place] [facility];  Service: Cardiovascular;  Laterality: N/A;   COLONOSCOPY WITH PROPOFOL N/A 11/22/2019   Procedure: COLONOSCOPY WITH PROPOFOL;  Surgeon: Daneil Dolin, MD; Four 4-5 mm polyps, findings suggestive of portal colopathy, congested appearing colonic mucosa diffusely, rectal varices, and adequate right colon prep.  Pathology with tubular adenomas and hyperplastic polyp.  Right colon biopsy with focal active colitis.  Recommendations to repeat colonoscopy in 3 months due to poor prep.   COLONOSCOPY WITH PROPOFOL N/A 03/17/2020   Procedure: COLONOSCOPY WITH PROPOFOL;  Surgeon: Daneil Dolin, MD;  [facility];  Service: Endoscopy;  Laterality: N/A;  8:45am - pt does not need covid test, was + 2/4 <90 days   CORONARY ARTERY BYPASS GRAFT N/A 08/29/2015   Procedure: CORONARY ARTERY BYPASS GRAFTING (CABG);  Surgeon: [name], MD;  Location: [place];  Service: Open Heart Surgery;  Laterality: N/A;   ESOPHAGEAL BANDING N/A 07/23/2021   Procedure: ESOPHAGEAL BANDING;  Surgeon: Eloise Harman, DO;  [facility];  Service: Endoscopy;  Laterality: N/A;   ESOPHAGOGASTRODUODENOSCOPY (EGD) WITH PROPOFOL N/A 11/22/2019   Procedure: ESOPHAGOGASTRODUODENOSCOPY (EGD) WITH PROPOFOL;  Surgeon: Daneil Dolin, MD; 4 columns of grade 2-3 esophageal varices, portal gastropathy, gastric polyp/abnormal gastric mucosa s/p biopsy.  Pathology with hyperplastic polyp, mild chronic  gastritis, negative H. pylori.   ESOPHAGOGASTRODUODENOSCOPY (EGD) WITH PROPOFOL N/A 07/23/2021   Procedure: ESOPHAGOGASTRODUODENOSCOPY (EGD) WITH PROPOFOL;  Surgeon: Eloise Harman, DO;  [facility];  Service: Endoscopy;  Laterality: N/A;   KNEE ARTHROSCOPY Left    TEE WITHOUT CARDIOVERSION N/A 08/29/2015   Procedure: TRANSESOPHAGEAL ECHOCARDIOGRAM (TEE);  Surgeon: [name], MD;  Location: [place];  Service: Open Heart Surgery;  Laterality: N/A;   TOTAL KNEE ARTHROPLASTY Left 03/09/2017   Procedure: TOTAL KNEE ARTHROPLASTY;  Surgeon: Carole Civil, MD;  Location: [facility];  Service: Orthopedics;  Laterality: Left;    Current Medications: Outpatient Medications Prior to Visit  Medication Sig Dispense Refill   acetaminophen (TYLENOL) 325 MG tablet Take 650 mg by mouth every 6 (six) hours as needed for mild pain.     albuterol (VENTOLIN HFA) 108 (90 Base) MCG/ACT inhaler Inhale 2 puffs into the lungs every 6 (six) hours as needed for wheezing or shortness of breath. 18 g 0   ALPRAZolam (XANAX) 0.5 MG tablet Take 1 tablet (0.5 mg total) by mouth daily as needed. 10 tablet 0   aspirin EC 81 MG tablet Take 1 tablet (81 mg total) by mouth daily with breakfast. 30 tablet 11   Cholecalciferol (DIALYVITE VITAMIN D 5000) 125 MCG (5000 UT) capsule Take 5,000 Units by mouth in the morning.      Continuous Blood Gluc Sensor (FREESTYLE LIBRE 14 DAY SENSOR) MISC SMARTSIG:1 Each Topical Every 2 Weeks     ferrous sulfate 325 (65 FE) MG tablet Take 1 tablet (325 mg total) by mouth daily with breakfast. 30 tablet 3   insulin aspart (NOVOLOG) 100 UNIT/ML injection Inject 5 Units into the skin 3 (three) times daily before meals. Special Instructions: If accu-check is greater than 150. Hold for accu-check 150 or below. With Meals 10 mL 0   insulin degludec (TRESIBA FLEXTOUCH) 200 UNIT/ML FlexTouch Pen Inject 40 Units into the skin in the morning. Give 30 units Subcutaneous  at Bedtime 8:00 pm  (Patient taking differently: Inject 30 Units into the skin in the morning. Give 30 units Subcutaneous  at Bedtime 8:00 pm) 15 mL 0   lactulose (CHRONULAC) 10 GM/15ML solution Take 30 mLs (20 g total) by mouth daily. 900 mL 0   lidocaine (XYLOCAINE) 2 % solution 5 mLs as needed.     loratadine (CLARITIN) 10 MG tablet Take 10 mg by mouth in the morning.      magnesium oxide (MAG-OX) 400 (240 Mg) MG tablet Take 1 tablet (400 mg total) by mouth daily. 30 tablet 2   midodrine (PROAMATINE) 10 MG tablet Take 1 tablet (10 mg total) by mouth 3 (three) times daily with meals. 90 tablet 5   nadolol (CORGARD) 20 MG tablet Take by mouth daily.     nitroGLYCERIN (NITROSTAT) 0.4 MG SL tablet Place 1 tablet (0.4 mg total) under the tongue every 5 (five) minutes as needed for chest pain. Do not exceed 3 doses in 15 mins 25 tablet 3   [place]  100 UNIT/ML FlexPen Inject into the skin.     OZEMPIC, 0.25 OR 0.5 MG/DOSE, 2 MG/1.5ML SOPN Inject into the skin.     OZEMPIC, 1 MG/DOSE, 4 MG/3ML SOPN Inject 1 mg into the skin once a week. 3 mL 0   pantoprazole (PROTONIX) 40 MG tablet Take 1 tablet (40 mg total) by mouth daily. 30 tablet 5   potassium chloride (KLOR-CON) 10 MEQ tablet Take 1 tablet (10 mEq total) by mouth daily. Take While taking Torsemide 30 tablet 2   pravastatin (PRAVACHOL) 20 MG tablet TAKE 1 TABLET(20 MG) BY MOUTH AT BEDTIME 30 tablet 0   PULMICORT FLEXHALER 180 MCG/ACT inhaler Inhale into the lungs.     rifaximin (XIFAXAN) 550 MG TABS tablet Take 1 tablet (550 mg total) by mouth 2 (two) times daily. 60 tablet 5   spironolactone (ALDACTONE) 25 MG tablet Take 0.5 tablets (12.5 mg total) by mouth daily. 30 tablet 3   torsemide (DEMADEX) 20 MG tablet Take 1 tablet (20 mg total) by mouth daily. 30 tablet 1   traMADol (ULTRAM) 50 MG tablet Take 1 tablet (50 mg total) by mouth every 6 (six) hours as needed. 60 tablet 5   venlafaxine XR (EFFEXOR-XR) 37.5 MG 24 hr capsule Take 37.5 mg by mouth daily.      venlafaxine XR (EFFEXOR-XR) 37.5 MG 24 hr capsule Take by mouth.     venlafaxine XR (EFFEXOR-XR) 75 MG 24 hr capsule Take 1 capsule (75 mg total) by mouth daily with breakfast. 30 capsule 3   No facility-administered medications prior to visit.     Allergies:   Fluticasone   Social History   Socioeconomic History   Marital status: Married    Spouse name: Not on file   Number of children: 0   Years of education: college   Highest education level: Not on file  Occupational History   Occupation: Maintenance tech    Employer: [employer]  Tobacco Use   Smoking status: Never   Smokeless tobacco: Never  Vaping Use   Vaping Use: Never used  Substance and Sexual Activity   Alcohol use: Not Currently   Drug use: No   Sexual activity: Not Currently  Other Topics Concern   Not on file  Social History Narrative   Not on file   Social Determinants of Health   Financial Resource Strain: Not on file  Food Insecurity: Not on file  Transportation Needs: Not on file  Physical Activity: Not on file  Stress: Not on file  Social Connections: Not on file     Family History:  The patient's family history includes ALS in his mother; Aneurysm in his maternal grandmother; Arthritis in an other family member; CAD in his brother and father; Cancer in his maternal grandfather and another family member; Diabetes in an other family member; Diabetes Mellitus II in his brother, brother, father, maternal grandmother, and sister; Hodgkin's lymphoma in his father; Liver cancer in his father.   Review of Systems:    Please see the history of present illness.     All other systems reviewed and are otherwise negative except as noted above.   Physical Exam:    VS:  BP 122/68    Pulse 68    Ht 5' 10.5" (1.791 m)    Wt 247 lb 3.2 oz (112.1 kg)    SpO2 98%    BMI 34.97 kg/m    General: Well developed, well nourished,male appearing in no acute distress. Head: Normocephalic, atraumatic.  Neck: No  carotid bruits. JVD not elevated.  Lungs: Respirations regular and unlabored, without wheezes or rales.  Heart: Regular rate and rhythm. No S3 or S4. 2/6 SEM along RUSB. Abdomen: Appears non-distended. No obvious abdominal masses. Msk:  Strength and tone appear normal for age. No obvious joint deformities or effusions. Extremities: No clubbing or cyanosis. Trace lower extremity edema.  Distal pedal pulses are 2+ bilaterally. Neuro: Alert and oriented X 3. Moves all extremities spontaneously. No focal deficits noted. Psych:  Responds to questions appropriately with a normal affect. Skin: No rashes or lesions noted  Wt Readings from Last 3 Encounters:  01/13/22 247 lb 3.2 oz (112.1 kg)  12/17/21 246 lb 4.1 oz (111.7 kg)  11/30/21 246 lb 4.1 oz (111.7 kg)     Studies/Labs Reviewed:   EKG:  EKG is not ordered today.   Recent Labs: 02/02/2021: TSH 1.901 07/28/2021: B Natriuretic Peptide 351.0 08/22/2021: Magnesium 1.9 10/18/2021: ALT 19; BUN 18; Creatinine, Ser 1.12; Potassium 3.6; Sodium 133 11/30/2021: Hemoglobin 13.6; Platelets 81   Lipid Panel    Component Value Date/Time   CHOL 120 05/25/2019 1001   TRIG 136 05/25/2019 1001   HDL 38 (L) 05/25/2019 1001   CHOLHDL 3.2 05/25/2019 1001   VLDL 27 05/25/2019 1001   LDLCALC 55 05/25/2019 1001    Additional studies/ records that were reviewed today include:   Echocardiogram: 05/2020 IMPRESSIONS     1. Left ventricular ejection fraction, by estimation, is 60 to 65%. The  left ventricle has normal function. The left ventricle has no regional  wall motion abnormalities. The left ventricular internal cavity size was  mildly dilated. There is mild left  ventricular hypertrophy. Left ventricular diastolic parameters are  indeterminate.   2. Right ventricular systolic function is normal. The right ventricular  size is normal. There is normal pulmonary artery systolic pressure.   3. The mitral valve is abnormal. Mild mitral valve  regurgitation.   4. AV is thickened, calcifieid Difficult to see well. Peak and mean  gradients through the valve are 31 and 18 mm Hg respectively consistent  with mild AS. Compared to echo report from 2019, gradients are increased.  . The aortic valve is abnormal. Aortic  valve regurgitation is not visualized. Mild aortic valve stenosis.   5. The inferior vena cava is dilated in size with <50% respiratory  variability, suggesting right atrial pressure of 15 mmHg.   Carotid Dopplers: 05/2020 Summary:  Right Carotid: Velocities in the right ICA are consistent with a 1-39%  stenosis.   Left Carotid: Velocities in the left ICA are consistent with a 40-59%  stenosis.  Echocardiogram: 12/2020 NORMAL LEFT VENTRICULAR SYSTOLIC FUNCTION    NORMAL RIGHT VENTRICULAR SYSTOLIC FUNCTION    NO VALVULAR REGURGITATION    VALVULAR STENOSIS: MILD AS    POOR SOUND TRANSMISSION   Assessment:    1. Coronary artery disease involving native coronary artery of native heart without angina pectoris   2. Chronic diastolic heart failure ([place])   3. Nonrheumatic aortic valve stenosis   4. Cirrhosis of liver with ascites, unspecified hepatic cirrhosis type ([place])   5. OSA on CPAP      Plan:   In order of problems listed above:  1. CAD - He is s/p CABG in 2016 and PET Perfusion study in 01/2021 showed inferior scar and mild ischemia with medical management recommended. - He did experience chest pain in 10/2021 but denies any recurrent symptoms since and his work-up at that time  was reassuring. - Continue current medication regimen with ASA 81 mg daily, Nadolol 20 mg daily and Pravastatin 20 mg daily.  2. HFpEF - His volume status has been stable with no significant change in his weight over the past few months. He remains on Torsemide 20 mg daily along with Spironolactone 12.5 mg daily and we reviewed that he could take an extra Torsemide tablet if needed for weight gain or worsening edema. Creatinine was  stable at 1.12 in 10/2021. I did not further titrate his baseline dosing at this time given his intermittent orthostatic hypotension as he does take Midodrine 10mg  TID due to adrenal insufficiency.  3. Aortic Stenosis - He had mild AS by echocardiogram in 12/2020. Would anticipate obtaining repeat imaging within the next 12 months unless clinically indicated in the interim.  4. Hepatic Cirrhosis - He is followed at [facility] for potential liver transplant. His volume status has remained stable and he remains on Lactulose, Spironolactone, Nadolol and Torsemide.  5. OSA - Continued compliance with CPAP encouraged.     Medication Adjustments/Labs and Tests Ordered: Current medicines are reviewed at length with the patient today.  Concerns regarding medicines are outlined above.  Medication changes, Labs and Tests ordered today are listed in the Patient Instructions below. Patient Instructions  Medication Instructions:   *If you need a refill on your cardiac medications before your next appointment, please call your pharmacy*   Lab Work:  None  Testing/Procedures:  None   Follow-Up: At [facility], you and your health needs are our priority.  As part of our continuing mission to provide you with exceptional heart care, we have created designated Provider Care Teams.  These Care Teams include your primary Cardiologist (physician) and Advanced Practice Providers (APPs -  Physician Assistants and Nurse Practitioners) who all work together to provide you with the care you need, when you need it.  We recommend signing up for the patient portal called "MyChart".  Sign up information is provided on this After Visit Summary.  MyChart is used to connect with patients for Virtual Visits (Telemedicine).  Patients are able to view lab/test results, encounter notes, upcoming appointments, etc.  Non-urgent messages can be sent to your provider as well.   To learn more about what you can do with  MyChart, go to [email or web address].    Your next appointment:   3-4 month(s)  The format for your next appointment:   In Person  Provider:   You may see Rozann Lesches, MD or one of the following Advanced Practice Providers on your designated Care Team:   Bernerd Pho, PA-C  Ermalinda Barrios, [place]     Other Instructions:  Please weigh yourself every morning. Take an extra Torsemide tablet if weight increases by 3 pounds overnight or 5 pounds in a single week.    Signed, Erma Heritage, PA-C  01/13/2022 4:53 PM    [facility] [address] [place], [place] [address] Phone: [phone] Fax: [phone]

## 2022-01-13 ENCOUNTER — Encounter: Payer: Self-pay | Admitting: Student

## 2022-01-13 ENCOUNTER — Other Ambulatory Visit: Payer: Self-pay

## 2022-01-13 ENCOUNTER — Encounter (HOSPITAL_COMMUNITY): Payer: Self-pay | Admitting: Hematology

## 2022-01-13 ENCOUNTER — Ambulatory Visit (INDEPENDENT_AMBULATORY_CARE_PROVIDER_SITE_OTHER): Payer: Federal, State, Local not specified - PPO | Admitting: Student

## 2022-01-13 VITALS — BP 122/68 | HR 68 | Ht 70.5 in | Wt 247.2 lb

## 2022-01-13 DIAGNOSIS — I350 Nonrheumatic aortic (valve) stenosis: Secondary | ICD-10-CM

## 2022-01-13 DIAGNOSIS — R188 Other ascites: Secondary | ICD-10-CM

## 2022-01-13 DIAGNOSIS — Z9989 Dependence on other enabling machines and devices: Secondary | ICD-10-CM

## 2022-01-13 DIAGNOSIS — K7460 Unspecified cirrhosis of liver: Secondary | ICD-10-CM

## 2022-01-13 DIAGNOSIS — I5032 Chronic diastolic (congestive) heart failure: Secondary | ICD-10-CM

## 2022-01-13 DIAGNOSIS — I2510 Atherosclerotic heart disease of native coronary artery without angina pectoris: Secondary | ICD-10-CM | POA: Diagnosis not present

## 2022-01-13 DIAGNOSIS — G4733 Obstructive sleep apnea (adult) (pediatric): Secondary | ICD-10-CM

## 2022-01-13 NOTE — Patient Instructions (Addendum)
Medication Instructions:   *If you need a refill on your cardiac medications before your next appointment, please call your pharmacy*   Lab Work:  None  Testing/Procedures:  None   Follow-Up: At [facility], you and your health needs are our priority.  As part of our continuing mission to provide you with exceptional heart care, we have created designated Provider Care Teams.  These Care Teams include your primary Cardiologist (physician) and Advanced Practice Providers (APPs -  Physician Assistants and Nurse Practitioners) who all work together to provide you with the care you need, when you need it.  We recommend signing up for the patient portal called "MyChart".  Sign up information is provided on this After Visit Summary.  MyChart is used to connect with patients for Virtual Visits (Telemedicine).  Patients are able to view lab/test results, encounter notes, upcoming appointments, etc.  Non-urgent messages can be sent to your provider as well.   To learn more about what you can do with MyChart, go to [email or web address].    Your next appointment:   3-4 month(s)  The format for your next appointment:   In Person  Provider:   You may see Rozann Lesches, MD or one of the following Advanced Practice Providers on your designated Care Team:   Bernerd Pho, PA-C  Ermalinda Barrios, [place]     Other Instructions:  Please weigh yourself every morning. Take an extra Torsemide tablet if weight increases by 3 pounds overnight or 5 pounds in a single week.

## 2022-01-21 DIAGNOSIS — R5381 Other malaise: Secondary | ICD-10-CM | POA: Diagnosis not present

## 2022-01-21 DIAGNOSIS — R531 Weakness: Secondary | ICD-10-CM | POA: Diagnosis not present

## 2022-01-21 DIAGNOSIS — W19XXXA Unspecified fall, initial encounter: Secondary | ICD-10-CM | POA: Diagnosis not present

## 2022-01-22 DIAGNOSIS — N189 Chronic kidney disease, unspecified: Secondary | ICD-10-CM | POA: Diagnosis not present

## 2022-01-22 DIAGNOSIS — E871 Hypo-osmolality and hyponatremia: Secondary | ICD-10-CM | POA: Diagnosis not present

## 2022-01-22 DIAGNOSIS — I129 Hypertensive chronic kidney disease with stage 1 through stage 4 chronic kidney disease, or unspecified chronic kidney disease: Secondary | ICD-10-CM | POA: Diagnosis not present

## 2022-01-22 DIAGNOSIS — E1122 Type 2 diabetes mellitus with diabetic chronic kidney disease: Secondary | ICD-10-CM | POA: Diagnosis not present

## 2022-02-12 DIAGNOSIS — E1140 Type 2 diabetes mellitus with diabetic neuropathy, unspecified: Secondary | ICD-10-CM | POA: Diagnosis not present

## 2022-02-12 DIAGNOSIS — J3089 Other allergic rhinitis: Secondary | ICD-10-CM | POA: Diagnosis not present

## 2022-02-12 DIAGNOSIS — Z6837 Body mass index (BMI) 37.0-37.9, adult: Secondary | ICD-10-CM | POA: Diagnosis not present

## 2022-02-12 DIAGNOSIS — J4531 Mild persistent asthma with (acute) exacerbation: Secondary | ICD-10-CM | POA: Diagnosis not present

## 2022-02-12 DIAGNOSIS — K7581 Nonalcoholic steatohepatitis (NASH): Secondary | ICD-10-CM | POA: Diagnosis not present

## 2022-02-12 DIAGNOSIS — E6609 Other obesity due to excess calories: Secondary | ICD-10-CM | POA: Diagnosis not present

## 2022-02-15 ENCOUNTER — Other Ambulatory Visit: Payer: Self-pay | Admitting: Adult Health

## 2022-02-15 DIAGNOSIS — I739 Peripheral vascular disease, unspecified: Secondary | ICD-10-CM | POA: Diagnosis not present

## 2022-02-15 DIAGNOSIS — E1151 Type 2 diabetes mellitus with diabetic peripheral angiopathy without gangrene: Secondary | ICD-10-CM | POA: Diagnosis not present

## 2022-02-15 DIAGNOSIS — M79671 Pain in right foot: Secondary | ICD-10-CM | POA: Diagnosis not present

## 2022-02-15 DIAGNOSIS — L110 Acquired keratosis follicularis: Secondary | ICD-10-CM | POA: Diagnosis not present

## 2022-02-22 ENCOUNTER — Encounter (HOSPITAL_COMMUNITY): Payer: Self-pay | Admitting: Hematology

## 2022-02-26 ENCOUNTER — Telehealth: Payer: Self-pay | Admitting: Cardiology

## 2022-02-26 DIAGNOSIS — J4531 Mild persistent asthma with (acute) exacerbation: Secondary | ICD-10-CM | POA: Diagnosis not present

## 2022-02-26 DIAGNOSIS — J3089 Other allergic rhinitis: Secondary | ICD-10-CM | POA: Diagnosis not present

## 2022-02-26 DIAGNOSIS — Z6837 Body mass index (BMI) 37.0-37.9, adult: Secondary | ICD-10-CM | POA: Diagnosis not present

## 2022-02-26 DIAGNOSIS — I10 Essential (primary) hypertension: Secondary | ICD-10-CM | POA: Diagnosis not present

## 2022-02-26 DIAGNOSIS — E1140 Type 2 diabetes mellitus with diabetic neuropathy, unspecified: Secondary | ICD-10-CM | POA: Diagnosis not present

## 2022-02-26 DIAGNOSIS — K7581 Nonalcoholic steatohepatitis (NASH): Secondary | ICD-10-CM | POA: Diagnosis not present

## 2022-02-26 NOTE — Telephone Encounter (Signed)
Pt c/o swelling: STAT is pt has developed SOB within 24 hours ? ?How much weight have you gained and in what time span? 5lbs in last 3 weeks ? ?If swelling, where is the swelling located? Stomach and legs ? ?Are you currently taking a fluid pill? yes ? ?Are you currently SOB? no ? ?Do you have a log of your daily weights (if so, list)?   ? ?Have you gained 3 pounds in a day or 5 pounds in a week? no ? ?Have you traveled recently? no ? ?

## 2022-02-26 NOTE — Telephone Encounter (Signed)
I spoke with patient. His last weight was 247 lbs. Today is 265 lbs. ? ?He denies SOB, just notes swelling in legs and abdomen. He states he weighs self daily and has noticed weight was "creeping  up" over past several weeks. ? ?He will take a additional Torsemide 20 mg today and the weekend. He will call us Monday, 3/20 with update. ? ? ? ? ?I will FYI B.Strader, PA-C ?

## 2022-03-01 ENCOUNTER — Inpatient Hospital Stay (HOSPITAL_COMMUNITY): Payer: Federal, State, Local not specified - PPO | Attending: Hematology

## 2022-03-01 DIAGNOSIS — K7460 Unspecified cirrhosis of liver: Secondary | ICD-10-CM | POA: Diagnosis not present

## 2022-03-01 DIAGNOSIS — D631 Anemia in chronic kidney disease: Secondary | ICD-10-CM | POA: Diagnosis not present

## 2022-03-01 DIAGNOSIS — K922 Gastrointestinal hemorrhage, unspecified: Secondary | ICD-10-CM | POA: Diagnosis not present

## 2022-03-01 DIAGNOSIS — I129 Hypertensive chronic kidney disease with stage 1 through stage 4 chronic kidney disease, or unspecified chronic kidney disease: Secondary | ICD-10-CM | POA: Insufficient documentation

## 2022-03-01 DIAGNOSIS — N183 Chronic kidney disease, stage 3 unspecified: Secondary | ICD-10-CM | POA: Diagnosis not present

## 2022-03-01 DIAGNOSIS — D6959 Other secondary thrombocytopenia: Secondary | ICD-10-CM | POA: Insufficient documentation

## 2022-03-01 DIAGNOSIS — E611 Iron deficiency: Secondary | ICD-10-CM | POA: Insufficient documentation

## 2022-03-01 DIAGNOSIS — D509 Iron deficiency anemia, unspecified: Secondary | ICD-10-CM | POA: Insufficient documentation

## 2022-03-01 DIAGNOSIS — R161 Splenomegaly, not elsewhere classified: Secondary | ICD-10-CM | POA: Diagnosis not present

## 2022-03-01 DIAGNOSIS — D696 Thrombocytopenia, unspecified: Secondary | ICD-10-CM | POA: Diagnosis not present

## 2022-03-01 LAB — CBC WITH DIFFERENTIAL/PLATELET
Abs Immature Granulocytes: 0.01 10*3/uL (ref 0.00–0.07)
Basophils Absolute: 0 10*3/uL (ref 0.0–0.1)
Basophils Relative: 1 %
Eosinophils Absolute: 0.1 10*3/uL (ref 0.0–0.5)
Eosinophils Relative: 2 %
HCT: 37.9 % — ABNORMAL LOW (ref 39.0–52.0)
Hemoglobin: 13 g/dL (ref 13.0–17.0)
Immature Granulocytes: 0 %
Lymphocytes Relative: 25 %
Lymphs Abs: 1.1 10*3/uL (ref 0.7–4.0)
MCH: 35 pg — ABNORMAL HIGH (ref 26.0–34.0)
MCHC: 34.3 g/dL (ref 30.0–36.0)
MCV: 102.2 fL — ABNORMAL HIGH (ref 80.0–100.0)
Monocytes Absolute: 0.4 10*3/uL (ref 0.1–1.0)
Monocytes Relative: 9 %
Neutro Abs: 2.8 10*3/uL (ref 1.7–7.7)
Neutrophils Relative %: 63 %
Platelets: 81 10*3/uL — ABNORMAL LOW (ref 150–400)
RBC: 3.71 MIL/uL — ABNORMAL LOW (ref 4.22–5.81)
RDW: 14.8 % (ref 11.5–15.5)
WBC: 4.4 10*3/uL (ref 4.0–10.5)
nRBC: 0 % (ref 0.0–0.2)

## 2022-03-01 LAB — FOLATE: Folate: 9.5 ng/mL (ref >5.9)

## 2022-03-01 LAB — FERRITIN: Ferritin: 99 ng/mL (ref 24–336)

## 2022-03-01 LAB — IRON AND TIBC
Iron: 88 ug/dL (ref 45–182)
Saturation Ratios: 26 % (ref 17.9–39.5)
TIBC: 335 ug/dL (ref 250–450)
UIBC: 247 ug/dL

## 2022-03-01 LAB — VITAMIN B12: Vitamin B-12: 819 pg/mL (ref 180–914)

## 2022-03-01 NOTE — Progress Notes (Signed)
? ? ? ? ? ? ?Gastroenterology Office Note   ? ? ?Primary Care Physician:  [name], MD  ?Primary Gastroenterologist: Dr. Gala Romney  ? ? ?Chief Complaint  ? ?Chief Complaint  ?Patient presents with  ? Follow-up  ?  3 month follow-up  ? ? ? ?History of Present Illness  ? ?[name] is a 61 y.o. male presenting today in follow-up with a history of cirrhosis likely secondary to NASH, prior history of very remote ETOH abuse, known esophageal varices with multiple prior bandings, GAVE, followed by [facility] for consideration of transplant, with recent hospitalization in Aug 2022 for acute on chronic anemia in setting of CKD and IDA. Received transfusion and underwent EGD with Grade 2 esophageal varices but no stigmata of bleeding, small amount of fresh blood noted in gastric fundus, portal gastropathy, GAVE. Colonoscopy in 2020 with multiple polyps and portal colopathy. Inadequate prep at that time. Rectal varices noted. Repeat attempt in 2021 with inadequate prep. Recommended colonoscopy at [facility] due to multiple difficult attempts locally.  ? ?EGD has been recommended to be updated by [facility]. Last [place] in Nov 2022 per [facility]. Gallbladder polyp noted in past but not on several prior ultrasounds. He is no longer on transplant list as MELD has improved. Now 11 as of Nov 2022. He is followed closely by [facility] for ultrasounds and routine visits. Next in April 2023.  ? ?Torsemide 20 mg daily spironolactone 12.5 mg daily. No lower extremity edema or ascites. No abdominal distension. No overt GI bleeding. Xifaxan BID. 3 BMs per day. Lactulose 30 ml daily. No jaundice or pruritis. No mental status changes or confusion. ? ?Recent labs by Hem/Onc: Hgb 13.0. Ferritin 99.  ? ? ? ?Past Medical History:  ?Diagnosis Date  ? Anemia   ? Arthritis   ? CAD (coronary artery disease)   ? Multivessel disease status post CABG 08/2015  ? Cirrhosis ([place])   ? CKD (chronic kidney disease) stage 3, GFR 30-59 ml/min (HCC)   ? Diastolic CHF ([place])   ?  Essential hypertension   ? Hyperlipidemia   ? Iron deficiency anemia 07/15/2021  ? OSA on CPAP   ? Polyclonal gammopathy   ? Thrombocytopenia ([place]) 2016  ? Type 2 diabetes mellitus ([place])   ? ? ?Past Surgical History:  ?Procedure Laterality Date  ? APPENDECTOMY    ? Biceps tendon surgery Right   ? BIOPSY  11/22/2019  ? Procedure: BIOPSY;  Surgeon: Daneil Dolin, MD;  [facility];  Service: Endoscopy;;  ? CARDIAC CATHETERIZATION N/A 08/25/2015  ? Procedure: Left Heart Cath and Coronary Angiography;  Surgeon: Belva Crome, MD;  Location: [place] CV LAB;  Service: Cardiovascular;  Laterality: N/A;  ? COLONOSCOPY WITH PROPOFOL N/A 11/22/2019  ? Procedure: COLONOSCOPY WITH PROPOFOL;  Surgeon: Daneil Dolin, MD; Four 4-5 mm polyps, findings suggestive of portal colopathy, congested appearing colonic mucosa diffusely, rectal varices, and adequate right colon prep.  Pathology with tubular adenomas and hyperplastic polyp.  Right colon biopsy with focal active colitis.  Recommendations to repeat colonoscopy in 3 months due to poor prep.  ? COLONOSCOPY WITH PROPOFOL N/A 03/17/2020  ? Procedure: COLONOSCOPY WITH PROPOFOL;  Surgeon: Daneil Dolin, MD;  [facility];  Service: Endoscopy;  Laterality: N/A;  8:45am - pt does not need covid test, was + 2/4 <90 days  ? CORONARY ARTERY BYPASS GRAFT N/A 08/29/2015  ? Procedure: CORONARY ARTERY BYPASS GRAFTING (CABG);  Surgeon: [name], MD;  [facility];  Service: Open Heart Surgery;  Laterality: N/A;  ? ESOPHAGEAL BANDING N/A 07/23/2021  ? Procedure: ESOPHAGEAL BANDING;  Surgeon: Eloise Harman, DO;  [facility];  Service: Endoscopy;  Laterality: N/A;  ? ESOPHAGOGASTRODUODENOSCOPY (EGD) WITH PROPOFOL N/A 11/22/2019  ? Procedure: ESOPHAGOGASTRODUODENOSCOPY (EGD) WITH PROPOFOL;  Surgeon: Daneil Dolin, MD; 4 columns of grade 2-3 esophageal varices, portal gastropathy, gastric polyp/abnormal gastric mucosa s/p biopsy.  Pathology  with hyperplastic polyp, mild chronic gastritis, negative H. pylori.  ? ESOPHAGOGASTRODUODENOSCOPY (EGD) WITH PROPOFOL N/A 07/23/2021  ? Procedure: ESOPHAGOGASTRODUODENOSCOPY (EGD) WITH PROPOFOL;  Surgeon: Eloise Harman, DO;  [facility];  Service: Endoscopy;  Laterality: N/A;  ? KNEE ARTHROSCOPY Left   ? TEE WITHOUT CARDIOVERSION N/A 08/29/2015  ? Procedure: TRANSESOPHAGEAL ECHOCARDIOGRAM (TEE);  Surgeon: [name], MD;  Location: [place];  Service: Open Heart Surgery;  Laterality: N/A;  ? TOTAL KNEE ARTHROPLASTY Left 03/09/2017  ? Procedure: TOTAL KNEE ARTHROPLASTY;  Surgeon: Carole Civil, MD;  Location: [facility] OR[facility];  Service: Orthopedics;  Laterality: Left;  ? ? ?Current Outpatient Medications  ?Medication Sig Dispense Refill  ? acetaminophen (TYLENOL) 325 MG tablet Take 650 mg by mouth every 6 (six) hours as needed for mild pain.    ? albuterol (VENTOLIN HFA) 108 (90 Base) MCG/ACT inhaler Inhale 2 puffs into the lungs every 6 (six) hours as needed for wheezing or shortness of breath. 18 g 0  ? ALPRAZolam (XANAX) 0.5 MG tablet Take 1 tablet (0.5 mg total) by mouth daily as needed. 10 tablet 0  ? aspirin EC 81 MG tablet Take 1 tablet (81 mg total) by mouth daily with breakfast. 30 tablet 11  ? Cholecalciferol (DIALYVITE VITAMIN D 5000) 125 MCG (5000 UT) capsule Take 5,000 Units by mouth in the morning.     ? Continuous Blood Gluc Sensor (FREESTYLE LIBRE 14 DAY SENSOR) MISC SMARTSIG:1 Each Topical Every 2 Weeks    ? ferrous sulfate 325 (65 FE) MG tablet Take 1 tablet (325 mg total) by mouth daily with breakfast. 30 tablet 3  ? insulin aspart (NOVOLOG) 100 UNIT/ML injection Inject 5 Units into the skin 3 (three) times daily before meals. Special Instructions: If accu-check is greater than 150. Hold for accu-check 150 or below. With Meals 10 mL 0  ? insulin degludec (TRESIBA FLEXTOUCH) 200 UNIT/ML FlexTouch Pen Inject 40 Units into the skin in the morning. Give 30 units Subcutaneous  at Bedtime  8:00 pm (Patient taking differently: Inject 30 Units into the skin in the morning. Give 30 units Subcutaneous  at Bedtime 8:00 pm) 15 mL 0  ? lactulose (CHRONULAC) 10 GM/15ML solution Take 30 mLs (20 g total) by mouth daily. 900 mL 0  ? loratadine (CLARITIN) 10 MG tablet Take 10 mg by mouth in the morning.     ? magnesium oxide (MAG-OX) 400 (240 Mg) MG tablet Take 1 tablet (400 mg total) by mouth daily. 30 tablet 2  ? midodrine (PROAMATINE) 10 MG tablet Take 1 tablet (10 mg total) by mouth 3 (three) times daily with meals. 90 tablet 5  ? nadolol (CORGARD) 20 MG tablet Take by mouth daily.    ? NOVOLOG FLEXPEN 100 UNIT/ML FlexPen Inject into the skin.    ? OZEMPIC, 0.25 OR 0.5 MG/DOSE, 2 MG/1.5ML SOPN Inject into the skin.    ? OZEMPIC, 1 MG/DOSE, 4 MG/3ML SOPN Inject 1 mg into the skin once a week. 3 mL 0  ? pantoprazole (PROTONIX) 40  MG tablet Take 1 tablet (40 mg total) by mouth daily. 30 tablet 5  ? potassium chloride (KLOR-CON) 10 MEQ tablet Take 1 tablet (10 mEq total) by mouth daily. Take While taking Torsemide 30 tablet 2  ? pravastatin (PRAVACHOL) 20 MG tablet TAKE 1 TABLET(20 MG) BY MOUTH AT BEDTIME 30 tablet 0  ? PULMICORT FLEXHALER 180 MCG/ACT inhaler Inhale into the lungs.    ? rifaximin (XIFAXAN) 550 MG TABS tablet Take 1 tablet (550 mg total) by mouth 2 (two) times daily. 60 tablet 5  ? spironolactone (ALDACTONE) 25 MG tablet Take 0.5 tablets (12.5 mg total) by mouth daily. 30 tablet 3  ? torsemide (DEMADEX) 20 MG tablet Take 1 tablet (20 mg total) by mouth daily. 30 tablet 1  ? traMADol (ULTRAM) 50 MG tablet Take 1 tablet (50 mg total) by mouth every 6 (six) hours as needed. 60 tablet 5  ? venlafaxine XR (EFFEXOR-XR) 37.5 MG 24 hr capsule Take by mouth.    ? venlafaxine XR (EFFEXOR-XR) 75 MG 24 hr capsule Take 1 capsule (75 mg total) by mouth daily with breakfast. 30 capsule 3  ? nitroGLYCERIN (NITROSTAT) 0.4 MG SL tablet Place 1 tablet (0.4 mg total) under the tongue every 5 (five) minutes as needed  for chest pain. Do not exceed 3 doses in 15 mins 25 tablet 3  ? ?No current facility-administered medications for this visit.  ? ? ?Allergies as of 03/02/2022 - Review Complete 03/02/2022  ?Allergen Ulyess Mort

## 2022-03-02 ENCOUNTER — Other Ambulatory Visit: Payer: Self-pay

## 2022-03-02 ENCOUNTER — Encounter: Payer: Self-pay | Admitting: Gastroenterology

## 2022-03-02 ENCOUNTER — Ambulatory Visit (INDEPENDENT_AMBULATORY_CARE_PROVIDER_SITE_OTHER): Payer: Federal, State, Local not specified - PPO | Admitting: Gastroenterology

## 2022-03-02 VITALS — BP 120/60 | HR 72 | Temp 96.8°F | Ht 70.0 in | Wt 255.0 lb

## 2022-03-02 DIAGNOSIS — I8500 Esophageal varices without bleeding: Secondary | ICD-10-CM | POA: Diagnosis not present

## 2022-03-02 DIAGNOSIS — I2510 Atherosclerotic heart disease of native coronary artery without angina pectoris: Secondary | ICD-10-CM

## 2022-03-02 DIAGNOSIS — K7460 Unspecified cirrhosis of liver: Secondary | ICD-10-CM

## 2022-03-02 NOTE — Patient Instructions (Signed)
I am glad you are doing well! ? ?We are arranging an upper endoscopy with Dr. Gala Romney in the near future. Please don't take [name] the day of the procedure. Also, only take 1/2 dose the evening prior.  ? ?Continue to follow with [facility] as you are doing. ? ?We recommend a colonoscopy at [place], as we have had difficulty completing here.  ? ?We will see you in 6 months! ? ?I enjoyed seeing you again today! As you know, I value our relationship and want to provide genuine, compassionate, and quality care. I welcome your feedback. If you receive a survey regarding your visit,  I greatly appreciate you taking time to fill this out. See you next time! ? ?Christopher Needs, PhD, ANP-BC ?[facility]  ? ?

## 2022-03-04 LAB — METHYLMALONIC ACID, SERUM: Methylmalonic Acid, Quantitative: 156 nmol/L (ref 0–378)

## 2022-03-08 ENCOUNTER — Other Ambulatory Visit (HOSPITAL_COMMUNITY): Payer: Self-pay | Admitting: Physician Assistant

## 2022-03-08 ENCOUNTER — Other Ambulatory Visit: Payer: Self-pay

## 2022-03-08 ENCOUNTER — Ambulatory Visit (HOSPITAL_COMMUNITY)
Admission: RE | Admit: 2022-03-08 | Discharge: 2022-03-08 | Disposition: A | Discharge status: Home / Self Care | Payer: Federal, State, Local not specified - PPO | Source: Ambulatory Visit | Attending: Physician Assistant | Admitting: Physician Assistant

## 2022-03-08 DIAGNOSIS — R053 Chronic cough: Secondary | ICD-10-CM | POA: Insufficient documentation

## 2022-03-08 DIAGNOSIS — I700 Atherosclerosis of aorta: Secondary | ICD-10-CM | POA: Diagnosis not present

## 2022-03-08 DIAGNOSIS — Z951 Presence of aortocoronary bypass graft: Secondary | ICD-10-CM | POA: Diagnosis not present

## 2022-03-08 DIAGNOSIS — E6609 Other obesity due to excess calories: Secondary | ICD-10-CM | POA: Diagnosis not present

## 2022-03-08 DIAGNOSIS — R059 Cough, unspecified: Secondary | ICD-10-CM | POA: Diagnosis not present

## 2022-03-08 DIAGNOSIS — Z6838 Body mass index (BMI) 38.0-38.9, adult: Secondary | ICD-10-CM | POA: Diagnosis not present

## 2022-03-08 NOTE — Progress Notes (Signed)
? ?[facility] ?[address] ?[place], [place] [address] ? ? ? ?Virtual Visit Progress Note ? ?I connected with [name] on 03/09/22 at 10:30 AM EDT by video enabled telemedicine visit and verified that I am speaking with the correct person using two identifiers.  ? ?I discussed the limitations, risks, security and privacy concerns of performing an evaluation and management service by telemedicine and the availability of in-person appointments. I also discussed with the patient that there may be a patient responsible charge related to this service. The patient expressed understanding and agreed to proceed.  ? ?Other persons participating in the visit and their role in the encounter: none  ? ?Patient?s location: home  ?Provider?s location: clinic  ? ?CLINIC:  ?Medical Oncology/Hematology ? ?PCP:  ?[name], MD ?[address] ?[place] [id number] ?[phone] ? ?REASON FOR VISIT:  ?Follow-up for iron deficiency anemia and thrombocytopenia ?  ?CURRENT THERAPY: Intermittent IV iron infusions ?  ?INTERVAL HISTORY:  ?[name] 61 y.o. male returns for routine follow-up of his anemia and thrombocytopenia in the setting of liver cirrhosis.  Last IV iron was from 10/05/2021 through 10/23/2021.  He was last seen by Tarri Abernethy PA-C on 11/30/2021. ? ?At today's visit, he reports feeling well.  No recent hospitalizations, surgeries, or changes in baseline health status. He denies any obvious bleeding events such as epistaxis, hematemesis, hematochezia, melena, or hematuria. He denies petechial rash. He denies any B symptoms such as fever, chills, night sweats, unintentional weight loss. He denies any pica, restless legs, headaches, chest pain, dyspnea on exertion, lightheadedness, or syncope. He reports 100% appetite, but reports that he is intentionally losing weight in order to improve his eligibility for liver transplant. Working to take in oral iron.  ? ?REVIEW OF SYSTEMS:   ?Review of Systems  ?Constitutional:  Negative for appetite change, fatigue and unexpected weight change.  ?HENT:   Negative for mouth sores, sore throat and trouble swallowing.   ?Respiratory:  Negative for chest tightness and shortness of breath.   ?Cardiovascular:  Negative for leg swelling.  ?Gastrointestinal:  Negative for abdominal pain, constipation, diarrhea, nausea and vomiting.  ?Genitourinary:  Negative for bladder incontinence and dysuria.   ?Musculoskeletal:  Negative for flank pain and neck stiffness.  ?Skin:  Negative for itching, rash and wound.  ?Neurological:  Negative for dizziness, headaches, light-headedness and numbness.  ?Psychiatric/Behavioral:  Negative for confusion, depression and sleep disturbance. The patient is not nervous/anxious.    ? ? ?PAST MEDICAL/SURGICAL HISTORY:  ?Past Medical History:  ?Diagnosis Date  ? Anemia   ? Arthritis   ? CAD (coronary artery disease)   ? Multivessel disease status post CABG 08/2015  ? Cirrhosis ([place])   ? CKD (chronic kidney disease) stage 3, GFR 30-59 ml/min (HCC)   ? Diastolic CHF ([place])   ? Essential hypertension   ? Hyperlipidemia   ? Iron deficiency anemia 07/15/2021  ? OSA on CPAP   ? Polyclonal gammopathy   ? Thrombocytopenia ([place]) 2016  ? Type 2 diabetes mellitus ([place])   ? ?Past Surgical History:  ?Procedure Laterality Date  ? APPENDECTOMY    ? Biceps tendon surgery Right   ? BIOPSY  11/22/2019  ? Procedure: BIOPSY;  Surgeon: Daneil Dolin, MD;  [facility];  Service: Endoscopy;;  ? CARDIAC CATHETERIZATION N/A 08/25/2015  ? Procedure: Left Heart Cath and Coronary Angiography;  Surgeon: Belva Crome, MD;  Location: [place] CV LAB;  Service: Cardiovascular;  Laterality: N/A;  ? COLONOSCOPY WITH PROPOFOL N/A 11/22/2019  ? Procedure: COLONOSCOPY WITH PROPOFOL;  Surgeon: Daneil Dolin, MD; Four 4-5 mm polyps, findings suggestive of portal colopathy, congested appearing colonic mucosa diffusely, rectal varices, and adequate right  colon prep.  Pathology with tubular adenomas and hyperplastic polyp.  Right colon biopsy with focal active colitis.  Recommendations to repeat colonoscopy in 3 months due to poor prep.  ? COLONOSCOPY WITH PROPOFOL N/A 03/17/2020  ? Procedure: COLONOSCOPY WITH PROPOFOL;  Surgeon: Daneil Dolin, MD;  [facility];  Service: Endoscopy;  Laterality: N/A;  8:45am - pt does not need covid test, was + 2/4 <90 days  ? CORONARY ARTERY BYPASS GRAFT N/A 08/29/2015  ? Procedure: CORONARY ARTERY BYPASS GRAFTING (CABG);  Surgeon: [name], MD;  Location: [place];  Service: Open Heart Surgery;  Laterality: N/A;  ? ESOPHAGEAL BANDING N/A 07/23/2021  ? Procedure: ESOPHAGEAL BANDING;  Surgeon: Eloise Harman, DO;  [facility];  Service: Endoscopy;  Laterality: N/A;  ? ESOPHAGOGASTRODUODENOSCOPY (EGD) WITH PROPOFOL N/A 11/22/2019  ? Procedure: ESOPHAGOGASTRODUODENOSCOPY (EGD) WITH PROPOFOL;  Surgeon: Daneil Dolin, MD; 4 columns of grade 2-3 esophageal varices, portal gastropathy, gastric polyp/abnormal gastric mucosa s/p biopsy.  Pathology with hyperplastic polyp, mild chronic gastritis, negative H. pylori.  ? ESOPHAGOGASTRODUODENOSCOPY (EGD) WITH PROPOFOL N/A 07/23/2021  ? Grade 2 esophageal varices without stigmata of bleeding, completely eradicated with banding, portal gastropathy, GAVE without bleeding.  ? KNEE ARTHROSCOPY Left   ? TEE WITHOUT CARDIOVERSION N/A 08/29/2015  ? Procedure: TRANSESOPHAGEAL ECHOCARDIOGRAM (TEE);  Surgeon: [name], MD;  Location: [place];  Service: Open Heart Surgery;  Laterality: N/A;  ? TOTAL KNEE ARTHROPLASTY Left 03/09/2017  ? Procedure: TOTAL KNEE ARTHROPLASTY;  Surgeon: Carole Civil, MD;  [facility];  Service: Orthopedics;  Laterality: Left;  ? ? ? ?SOCIAL HISTORY:  ?Social History  ? ?Socioeconomic History  ? Marital status: Married  ?  Spouse name: Not on file  ? Number of children: 0  ? Years of education: college  ? Highest  education level: Not on file  ?Occupational History  ? Occupation: Maintenance tech  ?  Employer: [employer]  ?Tobacco Use  ? Smoking status: Never  ? Smokeless tobacco: Never  ?Vaping Use  ? Vaping Use: Never used  ?Substance and Sexual Activity  ? Alcohol use: Not Currently  ? Drug use: No  ? Sexual activity: Not Currently  ?Other Topics Concern  ? Not on file  ?Social History Narrative  ? Not on file  ? ?Social Determinants of Health  ? ?Financial Resource Strain: Not on file  ?Food Insecurity: Not on file  ?Transportation Needs: Not on file  ?Physical Activity: Not on file  ?Stress: Not on file  ?Social Connections: Not on file  ?Intimate Partner Violence: Not on file  ? ? ?FAMILY HISTORY:  ?Family History  ?Problem Relation Age of Onset  ? Arthritis Other   ? Cancer Other   ? Diabetes Other   ? CAD Father   ? Diabetes Mellitus II Father   ? Liver cancer Father   ? Hodgkin's lymphoma Father   ? CAD Brother   ? Diabetes Mellitus II Brother   ? ALS Mother   ? Diabetes Mellitus II Sister   ? Diabetes Mellitus II Brother   ? Diabetes Mellitus II Maternal Grandmother   ? Aneurysm Maternal Grandmother   ? Cancer Maternal Grandfather   ? Anesthesia problems Neg Hx   ?  Hypotension Neg Hx   ? Malignant hyperthermia Neg Hx   ? Pseudochol deficiency Neg Hx   ? Colon cancer Neg Hx   ? ? ?CURRENT MEDICATIONS:  ?Outpatient Encounter Medications as of 03/09/2022  ?Medication Sig  ? acetaminophen (TYLENOL) 325 MG tablet Take 650 mg by mouth every 6 (six) hours as needed for mild pain.  ? albuterol (VENTOLIN HFA) 108 (90 Base) MCG/ACT inhaler Inhale 2 puffs into the lungs every 6 (six) hours as needed for wheezing or shortness of breath.  ? ALPRAZolam (XANAX) 0.5 MG tablet Take 1 tablet (0.5 mg total) by mouth daily as needed.  ? aspirin EC 81 MG tablet Take 1 tablet (81 mg total) by mouth daily with breakfast.  ? Cholecalciferol (DIALYVITE VITAMIN D 5000) 125 MCG (5000 UT) capsule Take 5,000 Units by mouth in the morning.    ? Continuous Blood Gluc Sensor (FREESTYLE LIBRE 14 DAY SENSOR) MISC SMARTSIG:1 Each Topical Every 2 Weeks  ? ferrous sulfate 325 (65 FE) MG tablet Take 1 tablet (325 mg total) by mouth daily with breakfast.  ? insu

## 2022-03-09 ENCOUNTER — Telehealth (HOSPITAL_BASED_OUTPATIENT_CLINIC_OR_DEPARTMENT_OTHER): Payer: Federal, State, Local not specified - PPO | Admitting: Nurse Practitioner

## 2022-03-09 DIAGNOSIS — E538 Deficiency of other specified B group vitamins: Secondary | ICD-10-CM | POA: Diagnosis not present

## 2022-03-09 DIAGNOSIS — D696 Thrombocytopenia, unspecified: Secondary | ICD-10-CM

## 2022-03-09 DIAGNOSIS — D509 Iron deficiency anemia, unspecified: Secondary | ICD-10-CM

## 2022-03-09 DIAGNOSIS — D539 Nutritional anemia, unspecified: Secondary | ICD-10-CM

## 2022-03-11 ENCOUNTER — Telehealth: Payer: Self-pay

## 2022-03-11 NOTE — Telephone Encounter (Signed)
Pt phoned and LMOVM regarding why you are scheduling him a colonoscopy and endoscopy. This needs to be put in writing and why they are needed. This is to be faxed to [facility] @ [phone]. ?

## 2022-03-16 ENCOUNTER — Encounter: Payer: Self-pay | Admitting: Gastroenterology

## 2022-03-16 NOTE — Telephone Encounter (Signed)
Spoke with Tretha Sciara she will investigate to see what is going on ?

## 2022-03-16 NOTE — Telephone Encounter (Signed)
I composed letter. We are doing endoscopy here locally, but we have requested that colonoscopy be done at [facility] due to several failed attempts due to poor prep.  ?

## 2022-03-16 NOTE — Telephone Encounter (Signed)
FYI: ? ?Hey I phoned and spoke with the pt and advised him of what and where he will be having his procedures. The pt stated that we had scheduled him in [place] for both. He thanks [place] for letting him know that and also that they will be contacting him. The pt expressed understanding. ?

## 2022-03-16 NOTE — Progress Notes (Signed)
Letter completed per patient request.  

## 2022-03-16 NOTE — Telephone Encounter (Signed)
I'm a bit confused as to what is going on. I believe EGD was going to be here, and we were referring him to [facility] for colonoscopy. However, looks like EGD is happening at [facility]? [facility] clinical pool: can you help clarify?  ?

## 2022-03-17 ENCOUNTER — Other Ambulatory Visit: Payer: Self-pay | Admitting: Adult Health

## 2022-03-17 DIAGNOSIS — E1122 Type 2 diabetes mellitus with diabetic chronic kidney disease: Secondary | ICD-10-CM | POA: Diagnosis not present

## 2022-03-17 DIAGNOSIS — Z79899 Other long term (current) drug therapy: Secondary | ICD-10-CM | POA: Diagnosis not present

## 2022-03-17 DIAGNOSIS — N183 Chronic kidney disease, stage 3 unspecified: Secondary | ICD-10-CM | POA: Diagnosis not present

## 2022-03-17 DIAGNOSIS — G4733 Obstructive sleep apnea (adult) (pediatric): Secondary | ICD-10-CM | POA: Diagnosis not present

## 2022-03-17 DIAGNOSIS — Z9049 Acquired absence of other specified parts of digestive tract: Secondary | ICD-10-CM | POA: Diagnosis not present

## 2022-03-17 DIAGNOSIS — R188 Other ascites: Secondary | ICD-10-CM | POA: Diagnosis not present

## 2022-03-17 DIAGNOSIS — I8510 Secondary esophageal varices without bleeding: Secondary | ICD-10-CM | POA: Diagnosis not present

## 2022-03-17 DIAGNOSIS — I5032 Chronic diastolic (congestive) heart failure: Secondary | ICD-10-CM | POA: Diagnosis not present

## 2022-03-17 DIAGNOSIS — Z794 Long term (current) use of insulin: Secondary | ICD-10-CM | POA: Diagnosis not present

## 2022-03-17 DIAGNOSIS — E1165 Type 2 diabetes mellitus with hyperglycemia: Secondary | ICD-10-CM | POA: Diagnosis not present

## 2022-03-17 DIAGNOSIS — K7460 Unspecified cirrhosis of liver: Secondary | ICD-10-CM | POA: Diagnosis not present

## 2022-03-17 DIAGNOSIS — I130 Hypertensive heart and chronic kidney disease with heart failure and stage 1 through stage 4 chronic kidney disease, or unspecified chronic kidney disease: Secondary | ICD-10-CM | POA: Diagnosis not present

## 2022-03-17 DIAGNOSIS — I2510 Atherosclerotic heart disease of native coronary artery without angina pectoris: Secondary | ICD-10-CM | POA: Diagnosis not present

## 2022-03-17 DIAGNOSIS — Z96652 Presence of left artificial knee joint: Secondary | ICD-10-CM | POA: Diagnosis not present

## 2022-03-17 DIAGNOSIS — K760 Fatty (change of) liver, not elsewhere classified: Secondary | ICD-10-CM | POA: Diagnosis not present

## 2022-03-17 DIAGNOSIS — R519 Headache, unspecified: Secondary | ICD-10-CM | POA: Diagnosis not present

## 2022-03-17 DIAGNOSIS — I8500 Esophageal varices without bleeding: Secondary | ICD-10-CM | POA: Diagnosis not present

## 2022-03-17 DIAGNOSIS — Z86010 Personal history of colonic polyps: Secondary | ICD-10-CM | POA: Diagnosis not present

## 2022-03-17 DIAGNOSIS — K766 Portal hypertension: Secondary | ICD-10-CM | POA: Diagnosis not present

## 2022-03-17 DIAGNOSIS — Z6837 Body mass index (BMI) 37.0-37.9, adult: Secondary | ICD-10-CM | POA: Diagnosis not present

## 2022-03-17 DIAGNOSIS — Z951 Presence of aortocoronary bypass graft: Secondary | ICD-10-CM | POA: Diagnosis not present

## 2022-03-17 NOTE — Telephone Encounter (Signed)
It was ordered by transplant team at [facility] ?

## 2022-03-17 NOTE — Telephone Encounter (Signed)
Our referral we sent only states for TCS and hepatology referral to transplant when sent  ?

## 2022-03-21 ENCOUNTER — Other Ambulatory Visit: Payer: Self-pay | Admitting: Cardiology

## 2022-03-26 ENCOUNTER — Other Ambulatory Visit (HOSPITAL_COMMUNITY)
Admission: RE | Admit: 2022-03-26 | Discharge: 2022-03-26 | Disposition: A | Discharge status: Home / Self Care | Payer: Federal, State, Local not specified - PPO | Source: Ambulatory Visit | Attending: Nephrology | Admitting: Nephrology

## 2022-03-26 DIAGNOSIS — E1122 Type 2 diabetes mellitus with diabetic chronic kidney disease: Secondary | ICD-10-CM | POA: Insufficient documentation

## 2022-03-26 DIAGNOSIS — N189 Chronic kidney disease, unspecified: Secondary | ICD-10-CM | POA: Diagnosis not present

## 2022-03-26 DIAGNOSIS — E876 Hypokalemia: Secondary | ICD-10-CM | POA: Diagnosis not present

## 2022-03-26 DIAGNOSIS — I517 Cardiomegaly: Secondary | ICD-10-CM | POA: Diagnosis not present

## 2022-03-26 DIAGNOSIS — D696 Thrombocytopenia, unspecified: Secondary | ICD-10-CM | POA: Insufficient documentation

## 2022-03-26 DIAGNOSIS — I129 Hypertensive chronic kidney disease with stage 1 through stage 4 chronic kidney disease, or unspecified chronic kidney disease: Secondary | ICD-10-CM | POA: Insufficient documentation

## 2022-03-26 DIAGNOSIS — E871 Hypo-osmolality and hyponatremia: Secondary | ICD-10-CM | POA: Diagnosis not present

## 2022-03-26 LAB — RENAL FUNCTION PANEL
Albumin: 3.3 g/dL — ABNORMAL LOW (ref 3.5–5.0)
Anion gap: 5 (ref 5–15)
BUN: 20 mg/dL (ref 8–23)
CO2: 22 mmol/L (ref 22–32)
Calcium: 8.9 mg/dL (ref 8.9–10.3)
Chloride: 111 mmol/L (ref 98–111)
Creatinine, Ser: 0.95 mg/dL (ref 0.61–1.24)
GFR, Estimated: >60 mL/min (ref >60)
Glucose, Bld: 124 mg/dL — ABNORMAL HIGH (ref 70–99)
Phosphorus: 3.3 mg/dL (ref 2.5–4.6)
Potassium: 3.9 mmol/L (ref 3.5–5.1)
Sodium: 138 mmol/L (ref 135–145)

## 2022-03-26 LAB — CBC WITH DIFFERENTIAL/PLATELET
Abs Immature Granulocytes: 0.02 10*3/uL (ref 0.00–0.07)
Basophils Absolute: 0 10*3/uL (ref 0.0–0.1)
Basophils Relative: 1 %
Eosinophils Absolute: 0.1 10*3/uL (ref 0.0–0.5)
Eosinophils Relative: 3 %
HCT: 34.6 % — ABNORMAL LOW (ref 39.0–52.0)
Hemoglobin: 12.1 g/dL — ABNORMAL LOW (ref 13.0–17.0)
Immature Granulocytes: 0 %
Lymphocytes Relative: 17 %
Lymphs Abs: 0.9 10*3/uL (ref 0.7–4.0)
MCH: 35 pg — ABNORMAL HIGH (ref 26.0–34.0)
MCHC: 35 g/dL (ref 30.0–36.0)
MCV: 100 fL (ref 80.0–100.0)
Monocytes Absolute: 0.7 10*3/uL (ref 0.1–1.0)
Monocytes Relative: 14 %
Neutro Abs: 3.4 10*3/uL (ref 1.7–7.7)
Neutrophils Relative %: 65 %
Platelets: 95 10*3/uL — ABNORMAL LOW (ref 150–400)
RBC: 3.46 MIL/uL — ABNORMAL LOW (ref 4.22–5.81)
RDW: 15.6 % — ABNORMAL HIGH (ref 11.5–15.5)
WBC: 5.2 10*3/uL (ref 4.0–10.5)
nRBC: 0 % (ref 0.0–0.2)

## 2022-03-26 LAB — PROTEIN / CREATININE RATIO, URINE
Creatinine, Urine: 134.7 mg/dL
Protein Creatinine Ratio: 0.12 mg/mg{Cre} (ref 0.00–0.15)
Total Protein, Urine: 16 mg/dL

## 2022-03-29 ENCOUNTER — Encounter: Payer: Self-pay | Admitting: Internal Medicine

## 2022-03-31 DIAGNOSIS — E871 Hypo-osmolality and hyponatremia: Secondary | ICD-10-CM | POA: Diagnosis not present

## 2022-03-31 DIAGNOSIS — D696 Thrombocytopenia, unspecified: Secondary | ICD-10-CM | POA: Diagnosis not present

## 2022-03-31 DIAGNOSIS — D472 Monoclonal gammopathy: Secondary | ICD-10-CM | POA: Diagnosis not present

## 2022-03-31 DIAGNOSIS — E1122 Type 2 diabetes mellitus with diabetic chronic kidney disease: Secondary | ICD-10-CM | POA: Diagnosis not present

## 2022-03-31 DIAGNOSIS — N189 Chronic kidney disease, unspecified: Secondary | ICD-10-CM | POA: Diagnosis not present

## 2022-03-31 DIAGNOSIS — I129 Hypertensive chronic kidney disease with stage 1 through stage 4 chronic kidney disease, or unspecified chronic kidney disease: Secondary | ICD-10-CM | POA: Diagnosis not present

## 2022-04-01 ENCOUNTER — Encounter: Payer: Self-pay | Admitting: Cardiology

## 2022-04-04 ENCOUNTER — Other Ambulatory Visit: Payer: Self-pay | Admitting: Adult Health

## 2022-04-09 DIAGNOSIS — H5703 Miosis: Secondary | ICD-10-CM | POA: Diagnosis not present

## 2022-04-09 DIAGNOSIS — H52223 Regular astigmatism, bilateral: Secondary | ICD-10-CM | POA: Insufficient documentation

## 2022-04-09 DIAGNOSIS — H2513 Age-related nuclear cataract, bilateral: Secondary | ICD-10-CM | POA: Diagnosis not present

## 2022-04-16 ENCOUNTER — Telehealth: Payer: Self-pay | Admitting: Orthopedic Surgery

## 2022-04-16 NOTE — Telephone Encounter (Signed)
Call received from Andreas Ohm, following up on faxed request for medical records from date 04/05/22. I verified our fax number. She will re-fax to our same number. Please review as to original fax request (not finding in release system) ?

## 2022-04-19 NOTE — Telephone Encounter (Signed)
Fax received last week, sent to [facility] to process, should be on its way. ?

## 2022-04-23 NOTE — Telephone Encounter (Signed)
The office of Andreas Ohm called back to inquire about medical records request as noted. States did not received. I relayed, per Release of information system, fax was sent on 04/22/22, but not to fax number on this particular request. Please notify [facility] to re-send request to Fax# [phone]. ?

## 2022-04-28 DIAGNOSIS — I8500 Esophageal varices without bleeding: Secondary | ICD-10-CM | POA: Diagnosis not present

## 2022-04-28 DIAGNOSIS — I8510 Secondary esophageal varices without bleeding: Secondary | ICD-10-CM | POA: Diagnosis not present

## 2022-04-28 DIAGNOSIS — R188 Other ascites: Secondary | ICD-10-CM | POA: Diagnosis not present

## 2022-04-28 DIAGNOSIS — R161 Splenomegaly, not elsewhere classified: Secondary | ICD-10-CM | POA: Diagnosis not present

## 2022-04-28 DIAGNOSIS — K8020 Calculus of gallbladder without cholecystitis without obstruction: Secondary | ICD-10-CM | POA: Diagnosis not present

## 2022-04-28 DIAGNOSIS — K7460 Unspecified cirrhosis of liver: Secondary | ICD-10-CM | POA: Diagnosis not present

## 2022-04-28 DIAGNOSIS — K766 Portal hypertension: Secondary | ICD-10-CM | POA: Diagnosis not present

## 2022-05-05 ENCOUNTER — Other Ambulatory Visit: Payer: Self-pay | Admitting: Family Medicine

## 2022-05-05 NOTE — Telephone Encounter (Signed)
Pt states for months he has been trying to get a refill on his Armodafinil, pt would like the Rx sent to [facility] [phone]

## 2022-05-06 ENCOUNTER — Encounter: Payer: Self-pay | Admitting: Neurology

## 2022-05-06 ENCOUNTER — Encounter: Payer: Self-pay | Admitting: Cardiology

## 2022-05-06 ENCOUNTER — Telehealth: Payer: Self-pay

## 2022-05-06 NOTE — Telephone Encounter (Signed)
Called the patient to get a little more information. Advised that in reviewing the chart I saw we use to order this for him in the past and then it appeared that he was getting it from another MD. Pt states he was getting it from Dr Gerarda Fraction and according to [facility] drug registry it was last filled 06/21/2021. Pt states that despite being compliant with cpap he still feels tired and sometimes feels like he could go right back to sleep. He states the armodafinil was helping with keeping him awake.  He tried calling Dr Gerarda Fraction first and there has been no response from their office. He reached out to [place] because we were the original prescribers and since he uses this due to fatigue r/t to osa he wanted to see if we could re start this for him.  I have advised that Dr Brett Fairy is out of the office and informed that I brought this to Amy NP's attention and she was wanting me to get more information. He states that he had stopped taking it and would only use as needed but the is finding that he is needing it more often to help with daytime sleepiness pt states he checks his BP daily and it has been running good, 120's/70's. Which is consistent with previous ov in mychart. I will have the patient reach out to his cardiologist to also get their take on restarting armodafinil. Advised the patient our office is closed until Tuesday may 30 and that hopefully between hearing back from cardiology and discussing with Amy we can give him an update then. Pt verbalized understanding and was appreciative.

## 2022-05-06 NOTE — Telephone Encounter (Signed)
Neurologist what's me to ask you if it is safe for me to continue to take this medication    Medication is Armodafinil    I will forward to [address] for review.

## 2022-05-11 NOTE — Telephone Encounter (Signed)
I left message for Mr.Christy to return my call.

## 2022-05-11 NOTE — Telephone Encounter (Signed)
I would be very hesitant to use this medication for this patient.  -CAD: The safety of armodafinil in those with cardiac disease has not been established. Modafinil, a racemic compound containing armodafinil, has been associated with cardiovascular side effects in clinical trials in some patients with cardiac disease. Some of these changes have included palpitations, chest pain, ischemic ECG changes, and dyspnea, particularly in patients with valvular heart disease (mitral valve prolapse) or left ventricular dysfunction. It is recommended that armodafinil not be prescribed to patients with chest pain, heart failure, cardiac arrhythmias, left ventricular hypertrophy, ischemic ECG changes, or valvular symptoms associated with CNS stimulants (e.g., mitral valve prolapse syndrome). Patients with a history of unstable angina or recent myocardial infarction should be treated with caution, as there is not extensive experience with armodafinil use in these patients. Armodafinil is not recommended for use in the setting of acute myocardial infarction. A small increase in blood pressure occurred in some patients during clinical trials with armodafinil; it is advisable to evaluate blood pressure periodically in patients with hypertension.    -Liver failure: Data on the use of armodafinil in those with hepatic disease, including cirrhosis and hepatitis, are not available. Serum concentrations of modafinil, a racemic compound containing armodafinil, increase by 50% and oral clearance is decreased by about 60% in the presence of severe hepatic disease due to decreased hepatic metabolism. Therefore, the manufacturer recommends that the dose of armodafinil be reduced in patients with severe hepatic disease.

## 2022-05-11 NOTE — Telephone Encounter (Signed)
I spoke with [name] and he will discuss medication with neurologist. He states he was already on medication to help keep him awake in the mornings and ran out.

## 2022-05-21 DIAGNOSIS — K7581 Nonalcoholic steatohepatitis (NASH): Secondary | ICD-10-CM | POA: Diagnosis not present

## 2022-05-21 DIAGNOSIS — J329 Chronic sinusitis, unspecified: Secondary | ICD-10-CM | POA: Diagnosis not present

## 2022-05-21 DIAGNOSIS — I5022 Chronic systolic (congestive) heart failure: Secondary | ICD-10-CM | POA: Diagnosis not present

## 2022-05-21 DIAGNOSIS — E1140 Type 2 diabetes mellitus with diabetic neuropathy, unspecified: Secondary | ICD-10-CM | POA: Diagnosis not present

## 2022-05-21 DIAGNOSIS — Z6841 Body Mass Index (BMI) 40.0 and over, adult: Secondary | ICD-10-CM | POA: Diagnosis not present

## 2022-05-21 DIAGNOSIS — I10 Essential (primary) hypertension: Secondary | ICD-10-CM | POA: Diagnosis not present

## 2022-05-21 DIAGNOSIS — J40 Bronchitis, not specified as acute or chronic: Secondary | ICD-10-CM | POA: Diagnosis not present

## 2022-05-25 NOTE — Progress Notes (Unsigned)
Cardiology Office Note    Date:  05/25/2022   ID:  DALESSANDRO [name], DOB 1960/12/30, MRN [id number]   PCP:  [name], [place]  Cardiologist:  Rozann Lesches, MD   Advanced Practice Provider:  No care team member to display Electrophysiologist:  None   [phone]   No chief complaint on file.   History of Present Illness:  [name] is a 61 y.o. male with past medical history of CAD (s/p CABG in 2016, PET Perfusion study in 01/2021 showing inferior scar and mild ischemia with medical management recommended), HFpEF, aortic stenosis, hepatic cirrhosis, adrenal insufficiency, HTN, HLD, Type 2 DM, Stage 3 CKD, OSA, anemia, thrombocytopenia and polyclonal gammopathy who presents to the office today for 10-monthfollow-up.    He was examined by Dr. HDebara Pickettin 10/2021 for follow-up from a recent Emergency Dept visit for chest pain but enzymes had been negative at that time and symptoms were felt to be atypical for a cardiac etiology. He denied any recurrent pain and given his recent reassuring stress test, further testing was not arranged and it was recommended to titrate medical therapy if he developed recurrent symptoms.     He last saw [facility]2023 and was stable.  He recently asked to be started on armodafinil by neurologist but our clinical pharmacist and Dr. MDomenic Politefelt the cardiac side effects were not acceptable to start.  Patient called in with increase leg edema.   Past Medical History:  Diagnosis Date   Anemia    Arthritis    CAD (coronary artery disease)    Multivessel disease status post CABG 08/2015   Cirrhosis ([facility]    CKD (chronic kidney disease) stage 3, GFR 30-59 ml/min (HCC)    Diastolic CHF (HCC)    Essential hypertension    Hyperlipidemia    Iron deficiency anemia 07/15/2021   OSA on CPAP    Polyclonal gammopathy    Thrombocytopenia (HCC) 2016   Type 2 diabetes mellitus ([facility]     Past Surgical  History:  Procedure Laterality Date   APPENDECTOMY     Biceps tendon surgery Right    BIOPSY  11/22/2019   Procedure: BIOPSY;  Surgeon: RDaneil Dolin MD;  [facility];  Service: Endoscopy;;   CARDIAC CATHETERIZATION N/A 08/25/2015   Procedure: Left Heart Cath and Coronary Angiography;  Surgeon: HBelva Crome MD;  [facility];  Service: Cardiovascular;  Laterality: N/A;   COLONOSCOPY WITH PROPOFOL N/A 11/22/2019   Procedure: COLONOSCOPY WITH PROPOFOL;  Surgeon: RDaneil Dolin MD; Four 4-5 mm polyps, findings suggestive of portal colopathy, congested appearing colonic mucosa diffusely, rectal varices, and adequate right colon prep.  Pathology with tubular adenomas and hyperplastic polyp.  Right colon biopsy with focal active colitis.  Recommendations to repeat colonoscopy in 3 months due to poor prep.   COLONOSCOPY WITH PROPOFOL N/A 03/17/2020   Procedure: COLONOSCOPY WITH PROPOFOL;  Surgeon: RDaneil Dolin MD;  [facility];  Service: Endoscopy;  Laterality: N/A;  8:45am - pt does not need covid test, was + 2/4 <90 days   CORONARY ARTERY BYPASS GRAFT N/A 08/29/2015   Procedure: CORONARY ARTERY BYPASS GRAFTING (CABG);  Surgeon: SMelrose Nakayama MD;  [facility]  Service: Open Heart Surgery;  Laterality: N/A;   ESOPHAGEAL BANDING N/A 07/23/2021   Procedure: ESOPHAGEAL BANDING;  Surgeon: CEloise Harman DO;  [facility];  Service: Endoscopy;  Laterality: N/A;   ESOPHAGOGASTRODUODENOSCOPY (EGD) WITH PROPOFOL N/A 11/22/2019   Procedure: ESOPHAGOGASTRODUODENOSCOPY (EGD) WITH PROPOFOL;  Surgeon: Daneil Dolin, MD; 4 columns of grade 2-3 esophageal varices, portal gastropathy, gastric polyp/abnormal gastric mucosa s/p biopsy.  Pathology with hyperplastic polyp, mild chronic gastritis, negative H. pylori.   ESOPHAGOGASTRODUODENOSCOPY (EGD) WITH PROPOFOL N/A 07/23/2021   Grade 2 esophageal varices without stigmata of bleeding,  completely eradicated with banding, portal gastropathy, GAVE without bleeding.   KNEE ARTHROSCOPY Left    TEE WITHOUT CARDIOVERSION N/A 08/29/2015   Procedure: TRANSESOPHAGEAL ECHOCARDIOGRAM (TEE);  Surgeon: [name], MD;  Location: [place];  Service: Open Heart Surgery;  Laterality: N/A;   TOTAL KNEE ARTHROPLASTY Left 03/09/2017   Procedure: TOTAL KNEE ARTHROPLASTY;  Surgeon: Carole Civil, MD;  Location: [facility];  Service: Orthopedics;  Laterality: Left;    Current Medications: No outpatient medications have been marked as taking for the 06/07/22 encounter (Appointment) with Imogene Burn, PA-C.     Allergies:   Fluticasone   Social History   Socioeconomic History   Marital status: Married    Spouse name: Not on file   Number of children: 0   Years of education: college   Highest education level: Not on file  Occupational History   Occupation: Maintenance tech    Employer: [employer]  Tobacco Use   Smoking status: Never   Smokeless tobacco: Never  Vaping Use   Vaping Use: Never used  Substance and Sexual Activity   Alcohol use: Not Currently   Drug use: No   Sexual activity: Not Currently  Other Topics Concern   Not on file  Social History Narrative   Not on file   Social Determinants of Health   Financial Resource Strain: Not on file  Food Insecurity: Not on file  Transportation Needs: Not on file  Physical Activity: Not on file  Stress: Not on file  Social Connections: Not on file     Family History:  The patient's ***family history includes ALS in his mother; Aneurysm in his maternal grandmother; Arthritis in an other family member; CAD in his brother and father; Cancer in his maternal grandfather and another family member; Diabetes in an other family member; Diabetes Mellitus II in his brother, brother, father, maternal grandmother, and sister; Hodgkin's lymphoma in his father; Liver cancer in his father.   ROS:   Please see the history of  present illness.    ROS All other systems reviewed and are negative.   PHYSICAL EXAM:   VS:  There were no vitals taken for this visit.  Physical Exam  GEN: Well nourished, well developed, in no acute distress  HEENT: normal  Neck: no JVD, carotid bruits, or masses Cardiac:RRR; no murmurs, rubs, or gallops  Respiratory:  clear to auscultation bilaterally, normal work of breathing GI: soft, nontender, nondistended, + BS Ext: without cyanosis, clubbing, or edema, Good distal pulses bilaterally MS: no deformity or atrophy  Skin: warm and dry, no rash Neuro:  Alert and Oriented x 3, Strength and sensation are intact Psych: euthymic mood, full affect  Wt Readings from Last 3 Encounters:  03/02/22 255 lb (115.7 kg)  01/13/22 247 lb 3.2 oz (112.1 kg)  12/17/21 246 lb 4.1 oz (111.7 kg)      Studies/Labs Reviewed:   EKG:  EKG is*** ordered today.  The ekg ordered today demonstrates ***  Recent Labs: 07/28/2021: B Natriuretic Peptide 351.0 08/22/2021: Magnesium 1.9 10/18/2021: ALT 19 03/26/2022: BUN 20; Creatinine, Ser  0.95; Hemoglobin 12.1; Platelets 95; Potassium 3.9; Sodium 138   Lipid Panel    Component Value Date/Time   CHOL 120 05/25/2019 1001   TRIG 136 05/25/2019 1001   HDL 38 (L) 05/25/2019 1001   CHOLHDL 3.2 05/25/2019 1001   VLDL 27 05/25/2019 1001   LDLCALC 55 05/25/2019 1001    Additional studies/ records that were reviewed today include:  Echocardiogram: 05/2020 IMPRESSIONS     1. Left ventricular ejection fraction, by estimation, is 60 to 65%. The  left ventricle has normal function. The left ventricle has no regional  wall motion abnormalities. The left ventricular internal cavity size was  mildly dilated. There is mild left  ventricular hypertrophy. Left ventricular diastolic parameters are  indeterminate.   2. Right ventricular systolic function is normal. The right ventricular  size is normal. There is normal pulmonary artery systolic pressure.   3.  The mitral valve is abnormal. Mild mitral valve regurgitation.   4. AV is thickened, calcifieid Difficult to see well. Peak and mean  gradients through the valve are 31 and 18 mm Hg respectively consistent  with mild AS. Compared to echo report from 2019, gradients are increased.  . The aortic valve is abnormal. Aortic  valve regurgitation is not visualized. Mild aortic valve stenosis.   5. The inferior vena cava is dilated in size with <50% respiratory  variability, suggesting right atrial pressure of 15 mmHg.    Carotid Dopplers: 05/2020 Summary:  Right Carotid: Velocities in the right ICA are consistent with a 1-39%  stenosis.   Left Carotid: Velocities in the left ICA are consistent with a 40-59%  stenosis.   Echocardiogram: 12/2020 NORMAL LEFT VENTRICULAR SYSTOLIC FUNCTION    NORMAL RIGHT VENTRICULAR SYSTOLIC FUNCTION    NO VALVULAR REGURGITATION    VALVULAR STENOSIS: MILD AS    POOR SOUND TRANSMISSION      Risk Assessment/Calculations:   {Does this patient have ATRIAL FIBRILLATION?:[phone]}     ASSESSMENT:    No diagnosis found.   PLAN:  In order of problems listed above:   CAD s/p CABG in 2016 and PET Perfusion study in 01/2021 showed inferior scar and mild ischemia with medical management recommended. - Continue current medication regimen with ASA 81 mg daily, Nadolol 20 mg daily and Pravastatin 20 mg daily.   HFpEF - His volume status has been stable with no significant change in his weight over the past few months. He remains on Torsemide 20 mg daily along with Spironolactone 12.5 mg daily and we reviewed that he could take an extra Torsemide tablet if needed for weight gain or worsening edema. Creatinine was stable at 1.12 in 10/2021. I did not further titrate his baseline dosing at this time given his intermittent orthostatic hypotension as he does take Midodrine '10mg'$  TID due to adrenal insufficiency.   Aortic Stenosis - He had mild AS by echocardiogram  in 12/2020. Would anticipate obtaining repeat imaging within the next 12 months unless clinically indicated in the interim.    Hepatic Cirrhosis - He is followed at [facility] for potential liver transplant. His volume status has remained stable and he remains on Lactulose, Spironolactone, Nadolol and Torsemide.   OSA - Continued compliance with CPAP encouraged.     Shared Decision Making/Informed Consent   {Are you ordering a CV Procedure (e.g. stress test, cath, DCCV, TEE, etc)?   Press F2        :[id number]}    Medication Adjustments/Labs and Tests Ordered: Current medicines are reviewed  at length with the patient today.  Concerns regarding medicines are outlined above.  Medication changes, Labs and Tests ordered today are listed in the Patient Instructions below. There are no Patient Instructions on file for this visit.   Sumner Boast, PA-C  05/25/2022 3:45 PM    [facility] [address], [place], [place]  [address] Phone: [phone]; Fax: [phone]

## 2022-05-31 NOTE — Telephone Encounter (Signed)
Persistent hypersomnia on CPAP-  Consider modafinil/armodafinil.  If the patient has an Epworth score of over 14/ 24 points consider HLA narcoelpsy test and MSLT if positive.  Clinical signs supporting would be sleep paralysis , cataplexy, dream intrusion, vivid dreams.   What is his fatigue score? Is it more significant than the sleepiness complaint?

## 2022-06-01 ENCOUNTER — Other Ambulatory Visit (HOSPITAL_COMMUNITY): Payer: Self-pay

## 2022-06-01 DIAGNOSIS — D509 Iron deficiency anemia, unspecified: Secondary | ICD-10-CM

## 2022-06-02 ENCOUNTER — Inpatient Hospital Stay (HOSPITAL_COMMUNITY): Payer: Federal, State, Local not specified - PPO | Attending: Hematology

## 2022-06-02 DIAGNOSIS — E2740 Unspecified adrenocortical insufficiency: Secondary | ICD-10-CM | POA: Insufficient documentation

## 2022-06-02 DIAGNOSIS — D696 Thrombocytopenia, unspecified: Secondary | ICD-10-CM | POA: Diagnosis not present

## 2022-06-02 DIAGNOSIS — N183 Chronic kidney disease, stage 3 unspecified: Secondary | ICD-10-CM | POA: Diagnosis not present

## 2022-06-02 DIAGNOSIS — D509 Iron deficiency anemia, unspecified: Secondary | ICD-10-CM | POA: Diagnosis not present

## 2022-06-02 DIAGNOSIS — K7460 Unspecified cirrhosis of liver: Secondary | ICD-10-CM | POA: Insufficient documentation

## 2022-06-02 DIAGNOSIS — D631 Anemia in chronic kidney disease: Secondary | ICD-10-CM | POA: Insufficient documentation

## 2022-06-02 LAB — CBC WITH DIFFERENTIAL/PLATELET
Abs Immature Granulocytes: 0.01 10*3/uL (ref 0.00–0.07)
Basophils Absolute: 0 10*3/uL (ref 0.0–0.1)
Basophils Relative: 1 %
Eosinophils Absolute: 0.1 10*3/uL (ref 0.0–0.5)
Eosinophils Relative: 3 %
HCT: 37.1 % — ABNORMAL LOW (ref 39.0–52.0)
Hemoglobin: 12.6 g/dL — ABNORMAL LOW (ref 13.0–17.0)
Immature Granulocytes: 0 %
Lymphocytes Relative: 13 %
Lymphs Abs: 0.7 10*3/uL (ref 0.7–4.0)
MCH: 32.7 pg (ref 26.0–34.0)
MCHC: 34 g/dL (ref 30.0–36.0)
MCV: 96.4 fL (ref 80.0–100.0)
Monocytes Absolute: 0.5 10*3/uL (ref 0.1–1.0)
Monocytes Relative: 9 %
Neutro Abs: 4.2 10*3/uL (ref 1.7–7.7)
Neutrophils Relative %: 74 %
Platelets: 85 10*3/uL — ABNORMAL LOW (ref 150–400)
RBC: 3.85 MIL/uL — ABNORMAL LOW (ref 4.22–5.81)
RDW: 14.6 % (ref 11.5–15.5)
WBC: 5.6 10*3/uL (ref 4.0–10.5)
nRBC: 0 % (ref 0.0–0.2)

## 2022-06-02 LAB — IRON AND TIBC
Iron: 78 ug/dL (ref 45–182)
Saturation Ratios: 27 % (ref 17.9–39.5)
TIBC: 288 ug/dL (ref 250–450)
UIBC: 210 ug/dL

## 2022-06-02 LAB — FERRITIN: Ferritin: 93 ng/mL (ref 24–336)

## 2022-06-03 ENCOUNTER — Encounter: Payer: Self-pay | Admitting: Neurology

## 2022-06-03 ENCOUNTER — Ambulatory Visit (INDEPENDENT_AMBULATORY_CARE_PROVIDER_SITE_OTHER): Payer: Federal, State, Local not specified - PPO | Admitting: Neurology

## 2022-06-03 VITALS — BP 144/78 | HR 80 | Ht 70.5 in | Wt 259.0 lb

## 2022-06-03 DIAGNOSIS — G4733 Obstructive sleep apnea (adult) (pediatric): Secondary | ICD-10-CM | POA: Diagnosis not present

## 2022-06-03 DIAGNOSIS — I5033 Acute on chronic diastolic (congestive) heart failure: Secondary | ICD-10-CM | POA: Diagnosis not present

## 2022-06-03 DIAGNOSIS — R188 Other ascites: Secondary | ICD-10-CM | POA: Diagnosis not present

## 2022-06-03 DIAGNOSIS — I2510 Atherosclerotic heart disease of native coronary artery without angina pectoris: Secondary | ICD-10-CM

## 2022-06-03 DIAGNOSIS — Z9989 Dependence on other enabling machines and devices: Secondary | ICD-10-CM

## 2022-06-03 DIAGNOSIS — G4710 Hypersomnia, unspecified: Secondary | ICD-10-CM | POA: Diagnosis not present

## 2022-06-03 DIAGNOSIS — K7460 Unspecified cirrhosis of liver: Secondary | ICD-10-CM

## 2022-06-03 MED ORDER — ARMODAFINIL 50 MG PO TABS: 50.0000 mg | ORAL_TABLET | ORAL | 5 refills | Status: DC | PRN

## 2022-06-03 NOTE — Patient Instructions (Signed)
Armodafinil Tablets What is this medication? ARMODAFINIL (ar moe DAF i nil) treats sleep disorders, such as narcolepsy, obstructive sleep apnea, and shift work disorder. It works by promoting wakefulness. It belongs to a group of medications called stimulants. This medicine may be used for other purposes; ask your health care provider or pharmacist if you have questions. COMMON BRAND NAME(S): Nuvigil  Prn 50 mg, allow 6 hours before bed time.  What should I tell my care team before I take this medication? They need to know if you have any of these conditions: Depression Heart disease High blood pressure Kidney disease Liver disease Schizophrenia Substance use disorder Suicidal thoughts, plans, or attempt by you or a family member An unusual or allergic reaction to armodafinil, other medications, foods, dyes, or preservatives Pregnant or trying to get pregnant Breast-feeding How should I use this medication? Take this medication by mouth with water. Take it as directed on the prescription label at the same time every day. Keep taking this medication unless your care team tells you to stop. Stopping it too quickly can cause serious side effects. It can also make your condition worse. A special MedGuide will be given to you by the pharmacist with each prescription and refill. Be sure to read this information carefully each time. Talk to your care team about the use of this medication in children. While this medication may be prescribed for children as young as 17 years for selected conditions, precautions do apply. Overdosage: If you think you have taken too much of this medicine contact a poison control center or emergency room at once. NOTE: This medicine is only for you. Do not share this medicine with others. What if I miss a dose? If you miss a dose, take it as soon as you can. If it is almost time for your next dose, take only that dose. Do not take double or extra doses. What may  interact with this medication? Do not take this medication with any of the following: Amphetamine or dextroamphetamine Dexmethylphenidate or methylphenidate MAOIs, such as Marplan, Nardil, and Parnate Pemoline Procarbazine This medication may also interact with the following: Antifungal medications, such as itraconazole or ketoconazole Barbiturates, such as phenobarbital Carbamazepine Cyclosporine Diazepam Estrogen or progestin hormones Medications for mental health conditions Phenytoin Propranolol Triazolam Warfarin This list may not describe all possible interactions. Give your health care provider a list of all the medicines, herbs, non-prescription drugs, or dietary supplements you use. Also tell them if you smoke, drink alcohol, or use illegal drugs. Some items may interact with your medicine. What should I watch for while using this medication? Visit your care team for regular checks on your progress. It may be some time before you see the benefit from this medication. This medication may affect your coordination, reaction time, or judgment. It may also hide signs that you are tired. This medication will not eliminate your abnormal tendency to fall asleep and is not a replacement for sleep. Do not drive or operate machinery until you know how this medication affects you. Sit up or stand slowly to reduce the risk of dizzy or fainting spells. Drinking alcohol with this medication can increase the risk of these side effects. This medication may cause serious skin reactions. They can happen weeks to months after starting the medication. Contact your care team right away if you notice fevers or flu-like symptoms with a rash. The rash may be red or purple and then turn into blisters or peeling of the skin.  You may also notice a red rash with swelling of the face, lips, or lymph nodes in your neck or under your arms. Estrogen or progestin hormones may not work as well while you are taking this  medication and for 1 month after stopping treatment. If you are using these hormones for contraception, talk to your care team about using a second type of contraception. A barrier contraceptive, such as a condom or diaphragm, is recommended. It is unknown if the effects of this medication will be increased by the use of caffeine. Caffeine is found in many foods, beverages, and medications. Ask your care team if you should limit or change your intake of caffeine-containing products while on this medication. Do not stop previously prescribed treatments for your condition, such as a CPAP machine, except on the advice of your care team. What side effects may I notice from receiving this medication? Side effects that you should report to your care team as soon as possible: Allergic reactions or angioedema--skin rash, itching or hives, swelling of the face, eyes, lips, tongue, arms, or legs, trouble swallowing or breathing Increase in blood pressure Mood and behavior changes--anxiety, nervousness, confusion, hallucinations, irritability, hostility, thoughts of suicide or self-harm, worsening mood, feelings of depression Rash, fever, and swollen lymph nodes Redness, blistering, peeling, or loosening of the skin, including inside the mouth Side effects that usually do not require medical attention (report to your care team if they continue or are bothersome): Dizziness Headache Nausea Trouble sleeping This list may not describe all possible side effects. Call your doctor for medical advice about side effects. You may report side effects to FDA at 1-800-FDA-1088. Where should I keep my medication? Keep out of the reach of children and pets. This medication can be abused. Keep it in a safe place to protect it from theft. Do not share it with anyone. It is only for you. Selling or giving away this medication is dangerous and against the law. Store at room temperature between 20 and 25 degrees C (68 and 77  degrees F). Get rid of any unused medication after the expiration date. This medication may cause harm and death if it is taken by other adults, children, or pets. It is important to get rid of the medication as soon as you no longer need it or it is expired. You can do this in two ways: Take the medication to a medication take-back program. Check with your pharmacy or law enforcement to find a location. If you cannot return the medication, check the label or package insert to see if the medication should be thrown out in the garbage or flushed down the toilet. If you are not sure, ask your care team. If it is safe to put it in the trash, take the medication out of the container. Mix the medication with cat litter, dirt, coffee grounds, or other unwanted substance. Seal the mixture in a bag or container. Put it in the trash. NOTE: This sheet is a summary. It may not cover all possible information. If you have questions about this medicine, talk to your doctor, pharmacist, or health care provider.  2023 Elsevier/Gold Standard (2022-01-07 00:00:00)

## 2022-06-03 NOTE — Progress Notes (Signed)
SLEEP MED[facility]INE [facility]IC   Provider:  Larey Seat, M D  Referring Provider: [name], MD Primary Care Physician:  [name], MD    06-03-2022;  I have the pleasure of meeting today again with Christopher. [name]. [place] who prefers to go by [name] and his wife.  The patient has been using CPAP compliantly 100% the last 30 out of 30 days his residual apnea hypopnea index is 0.2 which speaks for an excellent resolution.  The 95th percentile pressure is 13 cm water.  His machine is set at 9 cm water with 3 cm water pressure release upon exhalation.  He is using the machine on average 6 hours and 5 minutes.  So this is a CPAP set to 9 cm of water.  He does not have a high air leak but his wife is concerned and he is to that he has ongoing excessive daytime sleepiness and fatigue.  Amy Lomax had seen him last and wrote me a note making me aware that he has persistent hypersomnia on CPAP at the question what other work-up could be indicated. Narcolepsy symptoms are not present; no sleep paralysis, no cataplexy, no sleep attacks, no vivid dreams.  He has so many metabolic disorders: Cirrhosis of liver without ascites, unspecified hepatic cirrhosis type (CMS-HCC)   Esophageal varices with bleeding, banding  5 times- esophageal varices type (CMS-HCC)   Portal hypertension (CMS-HCC) . Had iron overload from transfusions. Anemic , chronic  low RBC, but ferritin was 93. Albumin 3.3 low. He was on modafinil in the past , but  liver and kidney effects were deemed to severe.  Reading Dr. West Carbo note from the pharmacy he was concerned about his coronary artery disease EKG changes of dyspnea and patient's with left ventricular dysfunction.  This patient has a history of coronary artery disease and heart failure.  So patient's should be treated with caution but there is no exclusion of ARMODAFINIL we have to adjust the dose to the lowest possible.  The same is true for liver failure there are no data  stating that armodafinil should not be used in patients with hepatic disease it should be reduced in patients with hepatic disease.  Will offer 50 mg modafinil, and prn use.        10/07/21 ALL:  [name] is a 61 y.o. male here today for follow up for OSA on CPAP.  He is doing well with new machine. He feels it is much quieter. He denies concerns with machine or supplies. He was hospitalized 08/11/2021 for hypokalemia and dehydration. He admits that 4 hour usage has been a little low since. He is recovering well. He does note a leak in mask at times.       Interval history : 10-16-2020: I have the pleasure of meeting today with [name]. Cauthorn who is called [name].  He is a meanwhile 61 year old and some 61 year old Caucasian gentleman who has followed in our practice for over 5 years with a diagnosis of obstructive sleep apnea snoring and being treated on CPAP.  His risk factors have always included morbid obesity. He states that he was recently told that he will need a liver transplant in response to a NASH nonalcoholic steatosis of the liver. Liver is cirrhotic. Had pancreatic failure-  He states that his pancreas makes neither digestive enzymes nor insulin. His diabetes is better controlled - he has a no needle check device.   He endorsed today the Epworth Sleepiness Scale  at only 4 points which is a remarkable response in the past he had sometimes been excessively daytime sleepy.  His fatigue score was high also at 53 out of 63 points and he stated today that he does feel sometimes depressed.  This is a score of 3 out of 15 points so a mild depression.  His C-Flex machine was interrogated today is a DreamStation auto CPAP this device had been recalled.  He has used it compliantly for 97% of the last 30 days usual average daily use is 4 hours 42 minutes only but he achieves a reduced AHI of only 0.3/h and his CPAP is set at 9 cm water.  There are sometimes air leaks but these  seem to be related to movement going to the bathroom etc.  Coumadin humidification setting has been medium, he has a C-Flex function of 1 cm water, the equivalent of an expiratory pressure relief.  He is on 10 cm ramp starting at 4 cmH2O pressure and reaching his max pressure of 9 cm.  He never used an ozone cleaner- his machine doesn't make noise and he has not noted particles in his water-chamber.  Due to supply chain delays, I cannot promise a replacement device to reach him  before January anyway. So I will order an HST to establish his baseline this year and order his new machine based on that- in the meantime he will continue to use the [employer] device.       06-19-2019. This is a revisit for [name],  61 year old caucasian right handed married male with Bypass surgery vessels 4.  Presenting with CPAP compliance data  And is suing OSA treatment by CPAP with since January 2017. He reached a BMI 38.31 kg/m2. under the corona pandemic he reported having less work and he is feeling well.  The patient is followed by our [facility] and uses a dream station auto CPAP.  30 days included the last night to be the night to 19 June 2019 with 100% days of use and 87% of Spiriva 4 hours of consecutive use.  The average AHI was 0.3 with a CPAP set at 9 cm water pressure.  Ramp time was 10 minutes and stop pressure is 5 cm water.  1 cm C-Flex setting which equals expiratory pressure relief in this model. In our last visit we had also discussed help to help with the excessive daytime fatigue and I suggested to try Nuvigil.  Today's Epworth score is endorsed at only 4 out of 24 points and seems to have improved greatly, the fatigue severity scale is still at 42 out of 63 points but this has been actually present for well over 3 years.     HPI:  [name] is a 61 y.o. male , who had been seen here on 03-03-2015 upon  referral from Dr. Hilma Favors for a sleep evaluation the first time. .    Christopher. [name] states that he is here because his physician and his spouse are concerned about his sleep apnea. About 7 weeks ago he had presented with chest tightness and was told that he had significant coronary artery disease. Some of his vessels were 95% occluded. He underwent bypass surgery on 08-29-15, and it was in the hospital that he was observed having apnea by his nurses as well as by his spouse. I would like to allude that the patient has other risk factors such as diabetes, hypertension, hypercholesterolemia and that he used to  be morbidly obese but lost about 80 pounds within the last 12 months. The patient acknowledges that he feels fatigued and daytime sleepy also this varies day by day. Often his sleep is not refreshing nonrestorative. His wife has noticed him to snore as well as witnessed apneas. Sleep habits are as follows:The patient retreats usually between 9 and 10 at night to the bedroom, he is promptly asleep. The bedroom is described as core, quiet and dark. He shares the bedroom in bed with his wife. His wife has not mentioned any restlessness kicking or thrashing. He prefers to sleep on his side and states that he cannot sleep on his back. He usually wakes up every 2 hours or so goes to the bathroom. 2-3 times at night. He goes promptly to sleep again. He rises in the morning at 6 AM and wakes spontaneously without alarm. He has not noticed a dry mouth in the morning, he may sometimes have a morning headache. He reports no numbness, no jaw pain. He has never been woken up by headaches from sleep. He reports that he dreams also his dreams are not acted out upon, nor are extremely vivid or Water quality scientist. When he wakes up he is often on his back and not on his side. Sleep medical history and family sleep history: Christopher. Kolander father suffered from coronary artery disease and died of multiorgan failure. Christopher. Mcadams younger brother also has undergone bypass surgery for  coronary artery disease. His brother also has been diagnosed with obstructive sleep apnea. Social history: married, disabled due to heart disease. He does not use tobacco products and does not drink alcohol, he drinks mainly water and has 1 or 2 coffees in the morning. No soda or iced tea.   Interval history from 03/02/2016  We are meeting today to follow-up on a recent sleep study;  the patient underwent polysomnography on 11/30/2015 and was diagnosed with mild to moderate apnea at an AHI of 16.5, REM AHI of 30.7, supine AHI of 90.6. He also had the oxygen nadir as low as 79% saturation was 30 minutes of desaturation time.  For this reason I suggested strongly to use CPAP rather than a dental device. The CPAP titration followed on 12/30/2015 and reduced the AHI to 1.3 the patient did best at 9 cm water pressure was 17 m EPR I recommended an air-fit P 10 in medium size with heated humidity. The patient's compliance download today shows 100% compliance for the last 30 days 97% compliance for use over 4 hours average user time is 7 hours 31 minutes and the patient has an AHI of 0.6 at 9 cm water pressure. This is an almost complete alleviation there is no significant air leak and he has been using the same unit humidity settings for the last month. He endorsed today the Epworth sleepiness score at 4 points and the fatigue severity score at 17 points.  Interval history from 03/02/2017, the patient continues to use CPAP at 100% compliance with an average of 6 hours and 38 minutes of nightly use, with a residual AHI of 0.4 at a setting of 9 cm water pressure. 100% compliant by time and number of days -his machine should be WiFi compatible more actual data should be obtained.  He assured me that he much more alert, able to multitask, concentrated and that his Epworth sleepiness score is 3 points, his fatigue severity score is 12 points. He lost 30 pounds since last year, he reported.  Interval history from 06 March 2018, I have the pleasure of seeing Vashaun Osmon today for his yearly follow-up.  His DME is [facility], he has been followed in this practice since 2016.  He is a very compliant CPAP user but this last 30-day download had a couple of gap days.  On 5 days he did not use his device he had a power outage.  Overall his compliance is still very good at 85%, average AHI is 0.7.  CPAP is set at 9 cm water pressure.  His ramp starts over 10 minutes at 5 cmH2O and he uses a 1 cm C-Flex setting.  Humidifier is set at level 2, the patient still endorses a moderate degree of fatigue and a SSS of 39, daytime sleepiness however is not present the Epworth sleepiness score was endorsed at 6 points. His sleep habits have been impaired by a shoulder injury- he cannot sleep on his preferred left side, neither on his back.  He had surgery for rotator cuff injury. No problems with anesthesia and waking up was uncomplicated.     VIDEO VISIT - 23 minutes.  Interval history from 12 March 2019.  This is a virtual visit with the patient Garrette Caine. Treml, I meanwhile 61 year old Caucasian male with a history of coronary artery disease status post four-vessel bypass, anemia followed by oncology-hematology and obstructive sleep apnea on CPAP.  In preparation for this video conference call we asked Christopher. Arnott to endorse the Epworth sleepiness score, he did this at 20 out of 24 possible points, he also endorsed fatigue and acknowledged some depression to be present.  He continues to have ankle edema has a history of right heart dysfunction, but he denies any interval history of atrial fibrillation, stroke, chest pain or shortness of breath.  I asked him questions about possible cataplectic symptoms, which he denied.  He reports that he sleeps between 8 and 9 hours each night, and that his wife has noted that he sometimes seems to still gasp for breath and may be wake up or seemingly wake up for a couple of seconds but  then resumes sleeping quietly and restfully.  He does not feel that he has extreme REM pressure he dreams vividly but not in naps, he had isolated cases of sleep paralysis.  His main problem is that he sleeps when he is not stimulated or physically active.  I reviewed his sleep records and his CPAP download, he has been my patient since 2016 he is using a [employer] C-Flex machine through [facility] his 100% compliance average user time 8 hours 45 minutes, he does have occasional air leakage but his residual AHI is only 0.4 events per hour of sleep at a CPAP setting of 9 cmH2O with an expiratory pressure relief setting of 1 cm.  Ramp time is 10 minutes start pressure of 5.  He was evaluated in May of last year by Dr. Conni Elliot for lower extremity edema and had a transthoracic echocardiography at [facility], this was compared to a previous study from June 2018.   Left ventricle was normal with very mild left ventricular hypertrophy, systolic function was normal with an ejection fraction of about 60%, aortic valve has mild calcification mild stenosis and no significant regurgitation.  Mildly calcified annulus of the mitral valve noted.  Right and left atrium were normal in size, pericardium appeared normal Dr. Conni Elliot felt that his right heart function was likely low normal due to obesity and obstructive sleep  apnea.  I would consider the sleep apnea however to be well treated.  The patient's sleep study PSG took place on 30 November 2015 with an AHI of 16.5/h during REM sleep of 30.7/h oxygen nadir 79% SPO2.   This was followed by a CPAP titration on 30 December 2015, he did excellent at 9 cmH2O was 1 cm EPR and uses a ResMed air fit P 10 and medium size.  In my last visit with the patient in March 2019 I had also asked him to consider medical weight loss and wellness program here at [facility].    I reviewed the patient's medication and his hematology oncology visit.  He was seen by Dr. Lorna Few on 30 December 2018 remarkable for persistent dry cough shortness of breath with exertion but no chest pain.  No night sweats.  Hemoglobin was 10.1 hematocrit 30.2% platelet count 80 7K.  Normal quantitative immunoglobulin.  IgA was slightly elevated  I reviewed the patient's current medication which includes tramadol as needed I think this was actually prescribed after a knee surgery and may not be his daily routine. Given that the patient has persistent hypersomnia while being well treated for obstructive sleep apnea on CPAP and has been highly compliant with this therapy, I am not sure if his continued sleepiness and fatigue at this point related to anemia or depression.  It seems that the heart function was not a big part.  I spoke with the patient about his safety when driving and he is concerned about his sleepiness.  For this reason I would consider prescribing modafinil or armodafinil.  I explained to the patient that it should not be taken with caffeine, that it is usually blood pressure neutral, that it has to be taken 12 hours before intended bedtime to not cause insomnia.  The patient agreed with a trial and I advised him that I will also inform Dr. Conni Elliot of his treatmen  Review of Systems: 11-04-201Out of a complete 14 system review, the patient complains of only the following symptoms, and all other reviewed systems are negative.  How likely are you to doze in the following situations: 0 = not likely, 1 = slight chance, 2 = moderate chance, 3 = high chance  Sitting and Reading? Watching Television? Sitting inactive in a public place (theater or meeting)? Lying down in the afternoon when circumstances permit? Sitting and talking to someone? Sitting quietly after lunch without alcohol? In a car, while stopped for a few minutes in traffic? As a passenger in a car for an hour without a break?  Total =      Epworth score was 6 in 2019 and is not 20/24 while comlpiant on  CPAP THERAPY,  FSS remains 39 points, no nocturia. Ankle edema and right heart dysfunction.   No dream pressure. No chest pain, no SOB.  Agrees to feeling depressed and fatigued but less sleepy on NUVIGIL>     Average sleep time each night is 8-9 hours, no dream enactment, no cataplexy, rare sleep paralysis.  How likely are you to doze in the following situations: 0 = not likely, 1 = slight chance, 2 = moderate chance, 3 = high chance  Sitting and Reading?1 Watching Television?1 Sitting inactive in a public place (theater or meeting)?1 Lying down in the afternoon when circumstances permit? Sitting and talking to someone? Sitting quietly after lunch without alcohol? In a car, while stopped for a few minutes in traffic? As a passenger in a  car for an hour without a break?1  Total = 18/ 24    FSS at 63/ 63 - not really fatigued.       Social History   Socioeconomic History   Marital status: Married    Spouse name: Not on file   Number of children: 0   Years of education: college   Highest education level: Not on file  Occupational History   Occupation: Maintenance tech    Employer: [employer]  Tobacco Use   Smoking status: Never   Smokeless tobacco: Never  Vaping Use   Vaping Use: Never used  Substance and Sexual Activity   Alcohol use: Not Currently   Drug use: No   Sexual activity: Not Currently  Other Topics Concern   Not on file  Social History Narrative   Not on file   Social Determinants of Health   Financial Resource Strain: Not on file  Food Insecurity: Not on file  Transportation Needs: Not on file  Physical Activity: Not on file  Stress: Not on file  Social Connections: Not on file  Intimate Partner Violence: Not on file    Family History  Problem Relation Age of Onset   Arthritis Other    Cancer Other    Diabetes Other    CAD Father    Diabetes Mellitus II Father    Liver cancer Father    Hodgkin's lymphoma Father    CAD Brother     Diabetes Mellitus II Brother    ALS Mother    Diabetes Mellitus II Sister    Diabetes Mellitus II Brother    Diabetes Mellitus II Maternal Grandmother    Aneurysm Maternal Grandmother    Cancer Maternal Grandfather    Anesthesia problems Neg Hx    Hypotension Neg Hx    Malignant hyperthermia Neg Hx    Pseudochol deficiency Neg Hx    Colon cancer Neg Hx     Past Medical History:  Diagnosis Date   Anemia    Arthritis    CAD (coronary artery disease)    Multivessel disease status post CABG 08/2015   Cirrhosis ([place])    CKD (chronic kidney disease) stage 3, GFR 30-59 ml/min (HCC)    Diastolic CHF ([place])    Essential hypertension    Hyperlipidemia    Iron deficiency anemia 07/15/2021   OSA on CPAP    Polyclonal gammopathy    Thrombocytopenia ([place]) 2016   Type 2 diabetes mellitus ([place])     Past Surgical History:  Procedure Laterality Date   APPENDECTOMY     Biceps tendon surgery Right    BIOPSY  11/22/2019   Procedure: BIOPSY;  Surgeon: Daneil Dolin, MD;  [facility];  Service: Endoscopy;;   CARDIAC CATHETERIZATION N/A 08/25/2015   Procedure: Left Heart Cath and Coronary Angiography;  Surgeon: Belva Crome, MD;  Location: [place] [facility];  Service: Cardiovascular;  Laterality: N/A;   COLONOSCOPY WITH PROPOFOL N/A 11/22/2019   Procedure: COLONOSCOPY WITH PROPOFOL;  Surgeon: Daneil Dolin, MD; Four 4-5 mm polyps, findings suggestive of portal colopathy, congested appearing colonic mucosa diffusely, rectal varices, and adequate right colon prep.  Pathology with tubular adenomas and hyperplastic polyp.  Right colon biopsy with focal active colitis.  Recommendations to repeat colonoscopy in 3 months due to poor prep.   COLONOSCOPY WITH PROPOFOL N/A 03/17/2020   Procedure: COLONOSCOPY WITH PROPOFOL;  Surgeon: Daneil Dolin, MD;  [facility];  Service: Endoscopy;  Laterality:  N/A;  8:45am - pt does not need covid test, was + 2/4 <90 days   CORONARY ARTERY  BYPASS GRAFT N/A 08/29/2015   Procedure: CORONARY ARTERY BYPASS GRAFTING (CABG);  Surgeon: [name], MD;  Location: [place];  Service: Open Heart Surgery;  Laterality: N/A;   ESOPHAGEAL BANDING N/A 07/23/2021   Procedure: ESOPHAGEAL BANDING;  Surgeon: Eloise Harman, DO;  [facility];  Service: Endoscopy;  Laterality: N/A;   ESOPHAGOGASTRODUODENOSCOPY (EGD) WITH PROPOFOL N/A 11/22/2019   Procedure: ESOPHAGOGASTRODUODENOSCOPY (EGD) WITH PROPOFOL;  Surgeon: Daneil Dolin, MD; 4 columns of grade 2-3 esophageal varices, portal gastropathy, gastric polyp/abnormal gastric mucosa s/p biopsy.  Pathology with hyperplastic polyp, mild chronic gastritis, negative H. pylori.   ESOPHAGOGASTRODUODENOSCOPY (EGD) WITH PROPOFOL N/A 07/23/2021   Grade 2 esophageal varices without stigmata of bleeding, completely eradicated with banding, portal gastropathy, GAVE without bleeding.   KNEE ARTHROSCOPY Left    TEE WITHOUT CARDIOVERSION N/A 08/29/2015   Procedure: TRANSESOPHAGEAL ECHOCARDIOGRAM (TEE);  Surgeon: [name], MD;  Location: [place];  Service: Open Heart Surgery;  Laterality: N/A;   TOTAL KNEE ARTHROPLASTY Left 03/09/2017   Procedure: TOTAL KNEE ARTHROPLASTY;  Surgeon: Carole Civil, MD;  Location: [facility];  Service: Orthopedics;  Laterality: Left;    Current Outpatient Medications  Medication Sig Dispense Refill   acetaminophen (TYLENOL) 325 MG tablet Take 650 mg by mouth every 6 (six) hours as needed for mild pain.     albuterol (VENTOLIN HFA) 108 (90 Base) MCG/ACT inhaler Inhale 2 puffs into the lungs every 6 (six) hours as needed for wheezing or shortness of breath. 18 g 0   ALPRAZolam (XANAX) 0.5 MG tablet Take 1 tablet (0.5 mg total) by mouth daily as needed. 10 tablet 0   aspirin EC 81 MG tablet Take 1 tablet (81 mg total) by mouth daily with breakfast. 30 tablet 11   Cholecalciferol (DIALYVITE VITAMIN D 5000) 125 MCG (5000 UT) capsule Take 5,000 Units by mouth  in the morning.      Continuous Blood Gluc Sensor (FREESTYLE LIBRE 14 DAY SENSOR) MISC SMARTSIG:1 Each Topical Every 2 Weeks     ferrous sulfate 325 (65 FE) MG tablet Take 1 tablet (325 mg total) by mouth daily with breakfast. 30 tablet 3   insulin aspart (NOVOLOG) 100 UNIT/ML injection Inject 5 Units into the skin 3 (three) times daily before meals. Special Instructions: If accu-check is greater than 150. Hold for accu-check 150 or below. With Meals 10 mL 0   insulin degludec (TRESIBA FLEXTOUCH) 200 UNIT/ML FlexTouch Pen Inject 40 Units into the skin in the morning. Give 30 units Subcutaneous  at Bedtime 8:00 pm (Patient taking differently: Inject 30 Units into the skin in the morning. Give 30 units Subcutaneous  at Bedtime 8:00 pm) 15 mL 0   lactulose (CHRONULAC) 10 GM/15ML solution Take 30 mLs (20 g total) by mouth daily. 900 mL 0   loratadine (CLARITIN) 10 MG tablet Take 10 mg by mouth in the morning.      magnesium oxide (MAG-OX) 400 (240 Mg) MG tablet Take 1 tablet (400 mg total) by mouth daily. 30 tablet 2   midodrine (PROAMATINE) 10 MG tablet Take 1 tablet (10 mg total) by mouth 3 (three) times daily with meals. 90 tablet 5   nadolol (CORGARD) 20 MG tablet Take by mouth daily.     NOVOLOG FLEXPEN 100 UNIT/ML FlexPen Inject into the skin.     OZEMPIC, 0.25 OR 0.5 MG/DOSE, 2  MG/1.5ML SOPN Inject into the skin.     OZEMPIC, 1 MG/DOSE, 4 MG/3ML SOPN Inject 1 mg into the skin once a week. 3 mL 0   pantoprazole (PROTONIX) 40 MG tablet Take 1 tablet (40 mg total) by mouth daily. 30 tablet 5   potassium chloride (KLOR-CON) 10 MEQ tablet Take 1 tablet (10 mEq total) by mouth daily. Take While taking Torsemide 30 tablet 2   pravastatin (PRAVACHOL) 20 MG tablet TAKE 1 TABLET(20 MG) BY MOUTH AT BEDTIME 90 tablet 1   PULMICORT FLEXHALER 180 MCG/ACT inhaler Inhale into the lungs.     rifaximin (XIFAXAN) 550 MG TABS tablet Take 1 tablet (550 mg total) by mouth 2 (two) times daily. 60 tablet 5    spironolactone (ALDACTONE) 25 MG tablet Take 0.5 tablets (12.5 mg total) by mouth daily. 30 tablet 3   torsemide (DEMADEX) 20 MG tablet Take 1 tablet (20 mg total) by mouth daily. 30 tablet 1   traMADol (ULTRAM) 50 MG tablet Take 1 tablet (50 mg total) by mouth every 6 (six) hours as needed. 60 tablet 5   venlafaxine XR (EFFEXOR-XR) 37.5 MG 24 hr capsule Take by mouth.     venlafaxine XR (EFFEXOR-XR) 75 MG 24 hr capsule Take 1 capsule (75 mg total) by mouth daily with breakfast. 30 capsule 3   nitroGLYCERIN (NITROSTAT) 0.4 MG SL tablet Place 1 tablet (0.4 mg total) under the tongue every 5 (five) minutes as needed for chest pain. Do not exceed 3 doses in 15 mins 25 tablet 3   No current facility-administered medications for this visit.    Allergies as of 06/03/2022 - Review Complete 06/03/2022  Allergen Reaction Noted   Fluticasone Rash 04/30/2020    Vitals: BP (!) 144/78   Pulse 80   Ht 5' 10.5" (1.791 m)   Wt 259 lb (117.5 kg)   BMI 36.64 kg/m  Last Weight:  Wt Readings from Last 1 Encounters:  06/03/22 259 lb (117.5 kg)   HQP:RFFM mass index is 36.64 kg/m.     Last Height:   Ht Readings from Last 1 Encounters:  06/03/22 5' 10.5" (1.791 m)    Physical exam:  General: The patient is awake, alert and appears not in acute distress. He is morbidly obese and still has ankle edema, reportedly lost 100 pounds in fluid on torsemide.  Head: Normocephalic, atraumatic.  Neck is supple, but short and thick-. Mallampati 2  ,  neck circumference:18.00"   .Neurologic exam : The patient is awake and alert, oriented to place and time.  Mood and affect are appropriate.  CN; intact; normal EOM, visual fil elds and pupillary response to light - direct and consensual.   Motor :  Denies tremor. No jaundice noted.   Assessment:  After physical and neurologic examination, review of laboratory studies. Personal review of imaging studies, reports of other /same  Imaging studies, results of  polysomnography/ neurophysiology testing and pre-existing records as far as provided in visit.   His sleep habits are stable , he rises at 6 AM. Even during the pandemic.  He is a compliant CPAP user, lost power for 4 days which affected this years compliance. He felt the difference in sleep quality, wife noted jerking and snoring.     Plan:  Treatment plan and additional workup :  Good luck with the liver transplant ! He is on the list.   I like for Christopher. Kynard to continue using his CPAP at the current setting he is using  also a dream wear mask . CPAP autotitration device was set up for January - February 2022.   He tolerated NUVIGIl very well, no palpitations, he also cut down from 4 to one cup of coffee, has achieved better blood glucose control I discussed the assessment and treatment plan with the patient. I refilled this script for PRN use.   I will refill at 50 mg.  See above for caveats with liver, CAD and anemia.    I provided 23 minutes of -face-to-face time during this encounter.   Asencion Partridge Prescott Truex, MD  RV in 12 month,   for CPAP compliance and prn Nuvigil refills.     Asencion Partridge Fred Franzen MD  06/03/2022   CC:  Dr. Domenic Polite, Dr.Mohamed,   PCP- [name], [address] [place] [address],  [place] [address]

## 2022-06-07 ENCOUNTER — Other Ambulatory Visit (HOSPITAL_COMMUNITY)
Admission: RE | Admit: 2022-06-07 | Discharge: 2022-06-07 | Disposition: A | Discharge status: Home / Self Care | Payer: Federal, State, Local not specified - PPO | Source: Ambulatory Visit | Attending: Physician Assistant | Admitting: Physician Assistant

## 2022-06-07 ENCOUNTER — Encounter: Payer: Self-pay | Admitting: Physician Assistant

## 2022-06-07 ENCOUNTER — Ambulatory Visit (INDEPENDENT_AMBULATORY_CARE_PROVIDER_SITE_OTHER): Payer: Federal, State, Local not specified - PPO | Admitting: Physician Assistant

## 2022-06-07 VITALS — BP 116/68 | HR 100 | Ht 70.5 in | Wt 258.0 lb

## 2022-06-07 DIAGNOSIS — I2510 Atherosclerotic heart disease of native coronary artery without angina pectoris: Secondary | ICD-10-CM | POA: Insufficient documentation

## 2022-06-07 DIAGNOSIS — I5032 Chronic diastolic (congestive) heart failure: Secondary | ICD-10-CM | POA: Insufficient documentation

## 2022-06-07 DIAGNOSIS — I350 Nonrheumatic aortic (valve) stenosis: Secondary | ICD-10-CM | POA: Diagnosis not present

## 2022-06-07 DIAGNOSIS — I8290 Acute embolism and thrombosis of unspecified vein: Secondary | ICD-10-CM

## 2022-06-07 DIAGNOSIS — K7460 Unspecified cirrhosis of liver: Secondary | ICD-10-CM

## 2022-06-07 DIAGNOSIS — G4733 Obstructive sleep apnea (adult) (pediatric): Secondary | ICD-10-CM

## 2022-06-07 DIAGNOSIS — Z9989 Dependence on other enabling machines and devices: Secondary | ICD-10-CM | POA: Diagnosis not present

## 2022-06-07 DIAGNOSIS — R188 Other ascites: Secondary | ICD-10-CM

## 2022-06-07 LAB — BASIC METABOLIC PANEL
Anion gap: 9 (ref 5–15)
BUN: 12 mg/dL (ref 8–23)
CO2: 23 mmol/L (ref 22–32)
Calcium: 8.5 mg/dL — ABNORMAL LOW (ref 8.9–10.3)
Chloride: 104 mmol/L (ref 98–111)
Creatinine, Ser: 1.03 mg/dL (ref 0.61–1.24)
GFR, Estimated: >60 mL/min (ref >60)
Glucose, Bld: 327 mg/dL — ABNORMAL HIGH (ref 70–99)
Potassium: 3.8 mmol/L (ref 3.5–5.1)
Sodium: 136 mmol/L (ref 135–145)

## 2022-06-07 LAB — BRAIN NATRIURETIC PEPTIDE: B Natriuretic Peptide: 51 pg/mL (ref 0.0–100.0)

## 2022-06-07 MED ORDER — POTASSIUM CHLORIDE ER 10 MEQ PO TBCR: 10.0000 meq | EXTENDED_RELEASE_TABLET | Freq: Every day | ORAL | 3 refills | Status: DC

## 2022-06-07 MED ORDER — NADOLOL 20 MG PO TABS: 20.0000 mg | ORAL_TABLET | Freq: Every day | ORAL | 3 refills | Status: DC

## 2022-06-08 ENCOUNTER — Ambulatory Visit (HOSPITAL_COMMUNITY)
Admission: RE | Admit: 2022-06-08 | Discharge: 2022-06-08 | Disposition: A | Discharge status: Home / Self Care | Payer: Federal, State, Local not specified - PPO | Source: Ambulatory Visit | Attending: Physician Assistant | Admitting: Physician Assistant

## 2022-06-08 ENCOUNTER — Other Ambulatory Visit: Payer: Self-pay

## 2022-06-08 ENCOUNTER — Telehealth: Payer: Self-pay

## 2022-06-08 DIAGNOSIS — I350 Nonrheumatic aortic (valve) stenosis: Secondary | ICD-10-CM | POA: Diagnosis not present

## 2022-06-08 LAB — ECHOCARDIOGRAM COMPLETE
AR max vel: 1.09 cm2
AV Area VTI: 1.08 cm2
AV Area mean vel: 1.07 cm2
AV Mean grad: 25 mmHg
AV Peak grad: 39.4 mmHg
Ao pk vel: 3.14 m/s
Area-P 1/2: 2.37 cm2
MV M vel: 5.66 m/s
MV Peak grad: 128.1 mmHg
S' Lateral: 3 cm

## 2022-06-08 NOTE — Progress Notes (Signed)
[name], [place] [address]   CLINIC:  Medical Oncology/Hematology  PCP:  [name], [place] [place] [address] [phone]   REASON FOR VISIT:  Follow-up for iron deficiency anemia and thrombocytopenia   CURRENT THERAPY: Intermittent IV iron infusions   INTERVAL HISTORY:  [name] 61 y.o. male returns for routine follow-up of his anemia and thrombocytopenia in the setting of liver cirrhosis.  Last IV iron was from 10/05/2021 through 10/23/2021.  He was last seen by NP Beckey Rutter on 03/09/2022.  At today's visit, he reports feeling well.  No recent hospitalizations, surgeries, or changes in baseline health status.  He denies any obvious bleeding events such as epistaxis, hematemesis, hematochezia, melena, or hematuria.  He reports easy bruising but denies petechial rash.  He denies any B symptoms such as fever, chills, night sweats, unintentional weight loss.  He does note worsening fatigue and sluggishness over the past month.  He denies any pica, restless legs, headaches, chest pain, dyspnea on exertion, lightheadedness, or syncope.  He has 60% energy and 80% appetite. He endorses that he is maintaining a stable weight.   REVIEW OF SYSTEMS:  Review of Systems  Constitutional:  Positive for fatigue. Negative for appetite change, chills, diaphoresis, fever and unexpected weight change.  HENT:   Negative for lump/mass and nosebleeds.   Eyes:  Negative for eye problems.  Respiratory:  Negative for cough, hemoptysis and shortness of breath.   Cardiovascular:  Negative for chest pain, leg swelling and palpitations.  Gastrointestinal:  Negative for abdominal pain, blood in stool, constipation, diarrhea, nausea and vomiting.  Genitourinary:  Negative for hematuria.   Musculoskeletal:  Positive for arthralgias.  Skin: Negative.   Neurological:  Negative for dizziness, headaches and light-headedness.  Hematological:   Does not bruise/bleed easily.      PAST MEDICAL/SURGICAL HISTORY:  Past Medical History:  Diagnosis Date   Anemia    Arthritis    CAD (coronary artery disease)    Multivessel disease status post CABG 08/2015   Cirrhosis (HCC)    CKD (chronic kidney disease) stage 3, GFR 30-59 ml/min (HCC)    Diastolic CHF (HCC)    Essential hypertension    Hyperlipidemia    Iron deficiency anemia 07/15/2021   OSA on CPAP    Polyclonal gammopathy    Thrombocytopenia (HCC) 2016   Type 2 diabetes mellitus ([facility])    Past Surgical History:  Procedure Laterality Date   APPENDECTOMY     Biceps tendon surgery Right    BIOPSY  11/22/2019   Procedure: BIOPSY;  Surgeon: Daneil Dolin, MD;  [facility];  Service: Endoscopy;;   CARDIAC CATHETERIZATION N/A 08/25/2015   Procedure: Left Heart Cath and Coronary Angiography;  Surgeon: Belva Crome, MD;  Location: [facility];  Service: Cardiovascular;  Laterality: N/A;   COLONOSCOPY WITH PROPOFOL N/A 11/22/2019   Procedure: COLONOSCOPY WITH PROPOFOL;  Surgeon: Daneil Dolin, MD; Four 4-5 mm polyps, findings suggestive of portal colopathy, congested appearing colonic mucosa diffusely, rectal varices, and adequate right colon prep.  Pathology with tubular adenomas and hyperplastic polyp.  Right colon biopsy with focal active colitis.  Recommendations to repeat colonoscopy in 3 months due to poor prep.   COLONOSCOPY WITH PROPOFOL N/A 03/17/2020   Procedure: COLONOSCOPY WITH PROPOFOL;  Surgeon: Daneil Dolin, MD;  [facility];  Service: Endoscopy;  Laterality: N/A;  8:45am - pt does not need covid test,  was + 2/4 <90 days   CORONARY ARTERY BYPASS GRAFT N/A 08/29/2015   Procedure: CORONARY ARTERY BYPASS GRAFTING (CABG);  Surgeon: [name], MD;  Location: [facility];  Service: Open Heart Surgery;  Laterality: N/A;   ESOPHAGEAL BANDING N/A 07/23/2021   Procedure: ESOPHAGEAL BANDING;  Surgeon: Eloise Harman, DO;  [facility];  Service: Endoscopy;  Laterality: N/A;   ESOPHAGOGASTRODUODENOSCOPY (EGD) WITH PROPOFOL N/A 11/22/2019   Procedure: ESOPHAGOGASTRODUODENOSCOPY (EGD) WITH PROPOFOL;  Surgeon: Daneil Dolin, MD; 4 columns of grade 2-3 esophageal varices, portal gastropathy, gastric polyp/abnormal gastric mucosa s/p biopsy.  Pathology with hyperplastic polyp, mild chronic gastritis, negative H. pylori.   ESOPHAGOGASTRODUODENOSCOPY (EGD) WITH PROPOFOL N/A 07/23/2021   Grade 2 esophageal varices without stigmata of bleeding, completely eradicated with banding, portal gastropathy, GAVE without bleeding.   KNEE ARTHROSCOPY Left    TEE WITHOUT CARDIOVERSION N/A 08/29/2015   Procedure: TRANSESOPHAGEAL ECHOCARDIOGRAM (TEE);  Surgeon: [name], MD;  Location: [facility];  Service: Open Heart Surgery;  Laterality: N/A;   TOTAL KNEE ARTHROPLASTY Left 03/09/2017   Procedure: TOTAL KNEE ARTHROPLASTY;  Surgeon: Carole Civil, MD;  Location: [facility];  Service: Orthopedics;  Laterality: Left;     SOCIAL HISTORY:  Social History   Socioeconomic History   Marital status: Married    Spouse name: Not on file   Number of children: 0   Years of education: college   Highest education level: Not on file  Occupational History   Occupation: Maintenance tech    Employer: [employer]  Tobacco Use   Smoking status: Never   Smokeless tobacco: Never  Vaping Use   Vaping Use: Never used  Substance and Sexual Activity   Alcohol use: Not Currently   Drug use: No   Sexual activity: Not Currently  Other Topics Concern   Not on file  Social History Narrative   Not on file   Social Determinants of Health   Financial Resource Strain: Not on file  Food Insecurity: Not on file  Transportation Needs: Not on file  Physical Activity: Not on file  Stress: Not on file  Social Connections: Not on file  Intimate Partner Violence: Not on file    FAMILY HISTORY:  Family History  Problem Relation Age of  Onset   Arthritis Other    Cancer Other    Diabetes Other    CAD Father    Diabetes Mellitus II Father    Liver cancer Father    Hodgkin's lymphoma Father    CAD Brother    Diabetes Mellitus II Brother    ALS Mother    Diabetes Mellitus II Sister    Diabetes Mellitus II Brother    Diabetes Mellitus II Maternal Grandmother    Aneurysm Maternal Grandmother    Cancer Maternal Grandfather    Anesthesia problems Neg Hx    Hypotension Neg Hx    Malignant hyperthermia Neg Hx    Pseudochol deficiency Neg Hx    Colon cancer Neg Hx     CURRENT MEDICATIONS:  Outpatient Encounter Medications as of 06/09/2022  Medication Sig   acetaminophen (TYLENOL) 325 MG tablet Take 650 mg by mouth every 6 (six) hours as needed for mild pain.   albuterol (VENTOLIN HFA) 108 (90 Base) MCG/ACT inhaler Inhale 2 puffs into the lungs every 6 (six) hours as needed for wheezing or shortness of breath.   ALPRAZolam (XANAX) 0.5 MG tablet Take 1 tablet (0.5 mg total) by mouth daily as  needed.   Armodafinil 50 MG tablet Take 1 tablet (50 mg total) by mouth as needed.   aspirin EC 81 MG tablet Take 1 tablet (81 mg total) by mouth daily with breakfast.   Cholecalciferol (DIALYVITE VITAMIN D 5000) 125 MCG (5000 UT) capsule Take 5,000 Units by mouth in the morning.    Continuous Blood Gluc Sensor (FREESTYLE LIBRE 14 DAY SENSOR) MISC SMARTSIG:1 Each Topical Every 2 Weeks   ferrous sulfate 325 (65 FE) MG tablet Take 1 tablet (325 mg total) by mouth daily with breakfast.   insulin aspart (NOVOLOG) 100 UNIT/ML injection Inject 5 Units into the skin 3 (three) times daily before meals. Special Instructions: If accu-check is greater than 150. Hold for accu-check 150 or below. With Meals   insulin degludec (TRESIBA FLEXTOUCH) 200 UNIT/ML FlexTouch Pen Inject 40 Units into the skin in the morning. Give 30 units Subcutaneous  at Bedtime 8:00 pm (Patient taking differently: Inject 30 Units into the skin in the morning. Give 30 units  Subcutaneous  at Bedtime 8:00 pm)   lactulose (CHRONULAC) 10 GM/15ML solution Take 30 mLs (20 g total) by mouth daily.   loratadine (CLARITIN) 10 MG tablet Take 10 mg by mouth in the morning.    magnesium oxide (MAG-OX) 400 (240 Mg) MG tablet Take 1 tablet (400 mg total) by mouth daily.   midodrine (PROAMATINE) 10 MG tablet Take 1 tablet (10 mg total) by mouth 3 (three) times daily with meals.   nadolol (CORGARD) 20 MG tablet Take 1 tablet (20 mg total) by mouth daily.   nitroGLYCERIN (NITROSTAT) 0.4 MG SL tablet Place 1 tablet (0.4 mg total) under the tongue every 5 (five) minutes as needed for chest pain. Do not exceed 3 doses in 15 mins   NOVOLOG FLEXPEN 100 UNIT/ML FlexPen Inject into the skin.   OZEMPIC, 0.25 OR 0.5 MG/DOSE, 2 MG/1.5ML SOPN Inject into the skin.   OZEMPIC, 1 MG/DOSE, 4 MG/3ML SOPN Inject 1 mg into the skin once a week.   pantoprazole (PROTONIX) 40 MG tablet Take 1 tablet (40 mg total) by mouth daily.   potassium chloride (KLOR-CON) 10 MEQ tablet Take 1 tablet (10 mEq total) by mouth daily. Take While taking Torsemide   pravastatin (PRAVACHOL) 20 MG tablet TAKE 1 TABLET(20 MG) BY MOUTH AT BEDTIME   PULMICORT FLEXHALER 180 MCG/ACT inhaler Inhale into the lungs.   rifaximin (XIFAXAN) 550 MG TABS tablet Take 1 tablet (550 mg total) by mouth 2 (two) times daily.   spironolactone (ALDACTONE) 25 MG tablet Take 0.5 tablets (12.5 mg total) by mouth daily.   torsemide (DEMADEX) 20 MG tablet Take 1 tablet (20 mg total) by mouth daily.   traMADol (ULTRAM) 50 MG tablet Take 1 tablet (50 mg total) by mouth every 6 (six) hours as needed.   venlafaxine XR (EFFEXOR-XR) 37.5 MG 24 hr capsule Take by mouth.   venlafaxine XR (EFFEXOR-XR) 75 MG 24 hr capsule Take 1 capsule (75 mg total) by mouth daily with breakfast.   No facility-administered encounter medications on file as of 06/09/2022.    ALLERGIES:  Allergies  Allergen Reactions   Fluticasone Rash     PHYSICAL EXAM:  ECOG  PERFORMANCE STATUS: 1 - Symptomatic but completely ambulatory  There were no vitals filed for this visit. There were no vitals filed for this visit. Physical Exam Constitutional:      Appearance: Normal appearance. He is obese.  HENT:     Head: Normocephalic and atraumatic.  Mouth/Throat:     Mouth: Mucous membranes are moist.  Eyes:     Extraocular Movements: Extraocular movements intact.     Pupils: Pupils are equal, round, and reactive to light.  Cardiovascular:     Rate and Rhythm: Normal rate and regular rhythm.     Pulses: Normal pulses.     Heart sounds: Normal heart sounds.  Pulmonary:     Effort: Pulmonary effort is normal.     Breath sounds: Normal breath sounds.  Abdominal:     General: Bowel sounds are normal.     Palpations: Abdomen is soft.     Tenderness: There is no abdominal tenderness.  Musculoskeletal:        General: No swelling.     Right lower leg: Edema (1+, chronic) present.     Left lower leg: Edema (trace, chronic) present.  Lymphadenopathy:     Cervical: No cervical adenopathy.  Skin:    General: Skin is warm and dry.  Neurological:     General: No focal deficit present.     Mental Status: He is alert and oriented to person, place, and time.  Psychiatric:        Mood and Affect: Mood normal.        Behavior: Behavior normal.      LABORATORY DATA:  I have reviewed the labs as listed.  CBC    Component Value Date/Time   WBC 5.6 06/02/2022 1016   RBC 3.85 (L) 06/02/2022 1016   HGB 12.6 (L) 06/02/2022 1016   HGB 12.5 (L) 05/03/2019 1005   HGB 10.1 (L) 12/15/2018 1525   HCT 37.1 (L) 06/02/2022 1016   HCT 30.2 (L) 12/15/2018 1525   PLT 85 (L) 06/02/2022 1016   PLT 72 (L) 05/03/2019 1005   PLT 87 (LL) 12/15/2018 1525   MCV 96.4 06/02/2022 1016   MCV 84 12/15/2018 1525   MCH 32.7 06/02/2022 1016   MCHC 34.0 06/02/2022 1016   RDW 14.6 06/02/2022 1016   RDW 15.1 12/15/2018 1525   LYMPHSABS 0.7 06/02/2022 1016   LYMPHSABS 1.5  12/15/2018 1525   MONOABS 0.5 06/02/2022 1016   EOSABS 0.1 06/02/2022 1016   EOSABS 0.1 12/15/2018 1525   BASOSABS 0.0 06/02/2022 1016   BASOSABS 0.0 12/15/2018 1525      Latest Ref Rng & Units 06/07/2022    3:19 PM 03/26/2022   12:29 PM 10/18/2021    2:50 AM  CMP  Glucose 70 - 99 mg/dL 327  124  241   BUN 8 - 23 mg/dL '12  20  18   '$ Creatinine 0.61 - 1.24 mg/dL 1.03  0.95  1.12   Sodium 135 - 145 mmol/L 136  138  133   Potassium 3.5 - 5.1 mmol/L 3.8  3.9  3.6   Chloride 98 - 111 mmol/L 104  111  100   CO2 22 - 32 mmol/L '23  22  26   '$ Calcium 8.9 - 10.3 mg/dL 8.5  8.9  9.2   Total Protein 6.5 - 8.1 g/dL   7.2   Total Bilirubin 0.3 - 1.2 mg/dL   1.0   Alkaline Phos 38 - 126 U/L   95   AST 15 - 41 U/L   45   ALT 0 - 44 U/L   19     DIAGNOSTIC IMAGING:  I have independently reviewed the relevant imaging and discussed with the patient.  ASSESSMENT & PLAN: 1.   Moderate thrombocytopenia, secondary to cirrhosis/splenomegaly -Thrombocytopenia since  2016.  Platelets between 55-90 since 2018. - BMBX on 01/26/2019 showed slightly hypercellular marrow for age with trilineage hematopoiesis.  No significant dyspoiesis or increase in blasts.  Plasma cells are slightly increased in number but with polyclonal staining pattern.  Abundant megakaryocytes with scattered small hypolobulated forms.  Chromosome analysis was normal.  Biopsy was done for work-up of increased IgA levels. - Ultrasound abdomen on 01/15/2020 showed nodular contour of the liver consistent with cirrhosis.  Splenomegaly with volume of 1562 mL. -Thrombocytopenia secondary to splenomegaly from cirrhosis - Most recent CBC (06/02/2022): Platelets 85, stable at baseline - He admits to easy bruising but denies petechial rash or major bleeding events   - PLAN: Platelets stable at baseline.  No treatment indicated at this time.  We will continue to monitor.  If platelets drop to < 30,000 would consider referral for partial splenic  embolization.   2.  Normocytic to macrocytic anemia: - Combination anemia from CKD, iron deficiency, and intermittent GI bleeding - Hospitalization from 07/21/2021 through 07/25/2021: Presented to ED with Hgb 6.7 and admitted for acute blood loss anemia.  Patient had EGD on 07/23/2021 with banding of grade 2 esophageal varices.  He was transfused PRBC x1 on 07/22/2021. - Colonoscopy on 03/17/2020 showed inadequate preparation of the colon with rectal varices.  Much of the colon could not be seen. - EGD (06/18/2021) at [facility]: Grade 2 esophageal varices s/p banding; gastric antral vascular ectasia without bleeding, portal hypertensive gastropathy - EGD (07/23/2021): Two columns of grade 2 varices s/p banding; moderate portal hypertensive gastropathy with small amount of fresh blood in this region, but without any active oozing; moderate gastric antral vascular ectasia without bleeding in gastric antrum. - Most recent blood transfusion with PRBC x1 on 07/22/2021 - Most recent IV iron with Venofer on 10/23/2021 - Denies any signs or symptoms of gross rectal hemorrhage or melena, even during his episode of acute blood loss anemia     - Symptomatic with worsening fatigue - Most recent labs (06/02/2022): Hgb 12.6/MCV 96.4, ferritin 93, iron saturation 27%. - Differential diagnosis favors anemia secondary to iron deficiency, suspect chronic GI blood loss in the setting of cirrhosis and esophageal varices; macrocytosis secondary to liver disease; element of mild CKD may also be contributing to his anemia  - PLAN: We will give IV Venofer 400 mg x 1 dose due to fatigue in the setting of ferritin <100. - Repeat labs and RTC in 3 months.  Patient is aware of alarm symptoms that would prompt more immediate medical attention. - Continue follow-up with GI for ongoing management of cirrhosis and esophageal varices.   3.  Positive rheumatoid factor: -Work-up for thrombocytopenia showed rheumatoid factor positive at 58.6.  ANA  was negative. -He does not have any clinical signs or symptoms of rheumatoid arthritis.   4.  Adrenal insufficiency: - He was hospitalized in the first week of April 2022 with adrenal insufficiency. - Was placed on hydrocortisone 20 mg twice daily and midodrine 15 mg 3 times daily - His blood pressure is well maintained at this time.   5.  Liver cirrhosis - He follows with [facility] and is undergoing evaluation to be put on transplant list   All questions were answered. The patient knows to call the clinic with any problems, questions or concerns.  Medical decision making: Moderate  Time spent on visit: I spent 20 minutes counseling the patient face to face. The total time spent in the appointment was 30 minutes and more than  50% was on counseling.   Harriett Rush, PA-C  06/09/2022 8:59 AM

## 2022-06-08 NOTE — Telephone Encounter (Signed)
(  Key: [id number])  This request has received an approval. View the bottom of the request for an electronic copy of the approval letter. The authorization is valid from 05/09/2022 through 06/08/2023

## 2022-06-09 ENCOUNTER — Inpatient Hospital Stay (HOSPITAL_BASED_OUTPATIENT_CLINIC_OR_DEPARTMENT_OTHER): Payer: Federal, State, Local not specified - PPO | Admitting: Physician Assistant

## 2022-06-09 VITALS — BP 126/73 | HR 75 | Temp 97.4°F | Resp 18 | Ht 68.7 in | Wt 258.2 lb

## 2022-06-09 DIAGNOSIS — D696 Thrombocytopenia, unspecified: Secondary | ICD-10-CM | POA: Diagnosis not present

## 2022-06-09 DIAGNOSIS — K7460 Unspecified cirrhosis of liver: Secondary | ICD-10-CM | POA: Diagnosis not present

## 2022-06-09 DIAGNOSIS — E2740 Unspecified adrenocortical insufficiency: Secondary | ICD-10-CM | POA: Diagnosis not present

## 2022-06-09 DIAGNOSIS — D631 Anemia in chronic kidney disease: Secondary | ICD-10-CM | POA: Diagnosis not present

## 2022-06-09 DIAGNOSIS — D509 Iron deficiency anemia, unspecified: Secondary | ICD-10-CM | POA: Diagnosis not present

## 2022-06-09 DIAGNOSIS — N183 Chronic kidney disease, stage 3 unspecified: Secondary | ICD-10-CM | POA: Diagnosis not present

## 2022-06-09 NOTE — Patient Instructions (Signed)
[name] at [facility] Discharge Instructions  You were seen today by Tarri Abernethy PA-C for the following conditions.  LOW PLATELETS: Your platelets remain mildly low, but at their usual baseline due to your underlying cirrhosis and enlarged liver.  You do not need any treatment for your low platelets at this time, but you should be aware that you are at increased risk of bleeding events.  IRON DEFICIENCY ANEMIA: Your blood and iron levels are stable.  Your iron levels are slightly lower than their goal, so we will give you 1 dose IV iron, since you are also having significant fatigue.  **You are always at a risk of recurrent anemia due to your underlying risk factors for GI bleeding.  Please seek immediate medical attention if you notice any bright red blood or dark black bowel movements in the toilet.  You should also let us know if you begin to experience symptoms of anemia such as worsening fatigue, shortness of breath with exertion, chest pain, or feeling like you may pass out.  LABS: Return in 3 months for repeat labs  FOLLOW-UP APPOINTMENT: Office visit in 3 months, after labs   Thank you for choosing [facility] at [facility] to provide your oncology and hematology care.  To afford each patient quality time with our provider, please arrive at least 15 minutes before your scheduled appointment time.   If you have a lab appointment with the [facility] please come in thru the Main Entrance and check in at the main information desk.  You need to re-schedule your appointment should you arrive 10 or more minutes late.  We strive to give you quality time with our providers, and arriving late affects you and other patients whose appointments are after yours.  Also, if you no show three or more times for appointments you may be dismissed from the clinic at the providers discretion.     Again, thank you for choosing [facility].  Our hope is that these requests will decrease the amount of time that you wait before being seen by our physicians.       _____________________________________________________________  Should you have questions after your visit to [facility], please contact our office at [phone] and follow the prompts.  Our office hours are 8:00 a.m. and 4:30 p.m. Monday - Friday.  Please note that voicemails left after 4:00 p.m. may not be returned until the following business day.  We are closed weekends and major holidays.  You do have access to a nurse 24-7, just call the main number to the clinic [phone] and do not press any options, hold on the line and a nurse will answer the phone.    For prescription refill requests, have your pharmacy contact our office and allow 72 hours.    Due to Covid, you will need to wear a mask upon entering the hospital. If you do not have a mask, a mask will be given to you at the Main Entrance upon arrival. For doctor visits, patients may have 1 support person age 55 or older with them. For treatment visits, patients can not have anyone with them due to social distancing guidelines and our immunocompromised population.

## 2022-06-10 ENCOUNTER — Encounter (HOSPITAL_COMMUNITY): Payer: Self-pay

## 2022-06-10 ENCOUNTER — Inpatient Hospital Stay (HOSPITAL_COMMUNITY): Payer: Federal, State, Local not specified - PPO

## 2022-06-10 VITALS — BP 129/68 | HR 73 | Temp 97.5°F | Resp 18

## 2022-06-10 DIAGNOSIS — E2740 Unspecified adrenocortical insufficiency: Secondary | ICD-10-CM | POA: Diagnosis not present

## 2022-06-10 DIAGNOSIS — D509 Iron deficiency anemia, unspecified: Secondary | ICD-10-CM | POA: Diagnosis not present

## 2022-06-10 DIAGNOSIS — D631 Anemia in chronic kidney disease: Secondary | ICD-10-CM | POA: Diagnosis not present

## 2022-06-10 DIAGNOSIS — D539 Nutritional anemia, unspecified: Secondary | ICD-10-CM

## 2022-06-10 DIAGNOSIS — K7460 Unspecified cirrhosis of liver: Secondary | ICD-10-CM | POA: Diagnosis not present

## 2022-06-10 DIAGNOSIS — D696 Thrombocytopenia, unspecified: Secondary | ICD-10-CM | POA: Diagnosis not present

## 2022-06-10 DIAGNOSIS — N183 Chronic kidney disease, stage 3 unspecified: Secondary | ICD-10-CM | POA: Diagnosis not present

## 2022-06-10 MED ORDER — SODIUM CHLORIDE 0.9 % IV SOLN
400.0000 mg | Freq: Once | INTRAVENOUS | Status: AC
  Administered 2022-06-10: 400 mg via INTRAVENOUS
  Filled 2022-06-10: qty 20

## 2022-06-10 MED ORDER — SODIUM CHLORIDE 0.9 % IV SOLN: Freq: Once | INTRAVENOUS | Status: AC

## 2022-06-10 MED ORDER — LORATADINE 10 MG PO TABS
10.0000 mg | ORAL_TABLET | Freq: Once | ORAL | Status: AC
  Administered 2022-06-10: 10 mg via ORAL
  Filled 2022-06-10: qty 1

## 2022-06-10 NOTE — Patient Instructions (Signed)
[name]  Discharge Instructions: Thank you for choosing [facility] to provide your oncology and hematology care.  If you have a lab appointment with the [facility], please come in thru the Main Entrance and check in at the main information desk.  Wear comfortable clothing and clothing appropriate for easy access to any Portacath or PICC line.   We strive to give you quality time with your provider. You may need to reschedule your appointment if you arrive late (15 or more minutes).  Arriving late affects you and other patients whose appointments are after yours.  Also, if you miss three or more appointments without notifying the office, you may be dismissed from the clinic at the provider's discretion.      For prescription refill requests, have your pharmacy contact our office and allow 72 hours for refills to be completed.    Today you received the following Venofer, return as scheduled.  Iron Sucrose Injection What is this medication? IRON SUCROSE (EYE ern SOO krose) treats low levels of iron (iron deficiency anemia) in people with kidney disease. Iron is a mineral that plays an important role in making red blood cells, which carry oxygen from your lungs to the rest of your body. This medicine may be used for other purposes; ask your health care provider or pharmacist if you have questions. COMMON BRAND NAME(S): Venofer What should I tell my care team before I take this medication? They need to know if you have any of these conditions: Anemia not caused by low iron levels Heart disease High levels of iron in the blood Kidney disease Liver disease An unusual or allergic reaction to iron, other medications, foods, dyes, or preservatives Pregnant or trying to get pregnant Breast-feeding How should I use this medication? This medication is for infusion into a vein. It is given in a hospital or clinic setting. Talk to your care team about the use of this  medication in children. While this medication may be prescribed for children as young as 2 years for selected conditions, precautions do apply. Overdosage: If you think you have taken too much of this medicine contact a poison control center or emergency room at once. NOTE: This medicine is only for you. Do not share this medicine with others. What if I miss a dose? It is important not to miss your dose. Call your care team if you are unable to keep an appointment. What may interact with this medication? Do not take this medication with any of the following: Deferoxamine Dimercaprol Other iron products This medication may also interact with the following: Chloramphenicol Deferasirox This list may not describe all possible interactions. Give your health care provider a list of all the medicines, herbs, non-prescription drugs, or dietary supplements you use. Also tell them if you smoke, drink alcohol, or use illegal drugs. Some items may interact with your medicine. What should I watch for while using this medication? Visit your care team regularly. Tell your care team if your symptoms do not start to get better or if they get worse. You may need blood work done while you are taking this medication. You may need to follow a special diet. Talk to your care team. Foods that contain iron include: whole grains/cereals, dried fruits, beans, or peas, leafy green vegetables, and organ meats (liver, kidney). What side effects may I notice from receiving this medication? Side effects that you should report to your care team as soon as possible: Allergic  reactions--skin rash, itching, hives, swelling of the face, lips, tongue, or throat Low blood pressure--dizziness, feeling faint or lightheaded, blurry vision Shortness of breath Side effects that usually do not require medical attention (report to your care team if they continue or are bothersome): Flushing Headache Joint pain Muscle  pain Nausea Pain, redness, or irritation at injection site This list may not describe all possible side effects. Call your doctor for medical advice about side effects. You may report side effects to FDA at 1-800-FDA-1088. Where should I keep my medication? This medication is given in a hospital or clinic and will not be stored at home. NOTE: This sheet is a summary. It may not cover all possible information. If you have questions about this medicine, talk to your doctor, pharmacist, or health care provider.  2023 Elsevier/Gold Standard (2021-04-24 00:00:00)    To help prevent nausea and vomiting after your treatment, we encourage you to take your nausea medication as directed.  BELOW ARE SYMPTOMS THAT SHOULD BE REPORTED IMMEDIATELY: *FEVER GREATER THAN 100.4 F (38 C) OR HIGHER *CHILLS OR SWEATING *NAUSEA AND VOMITING THAT IS NOT CONTROLLED WITH YOUR NAUSEA MEDICATION *UNUSUAL SHORTNESS OF BREATH *UNUSUAL BRUISING OR BLEEDING *URINARY PROBLEMS (pain or burning when urinating, or frequent urination) *BOWEL PROBLEMS (unusual diarrhea, constipation, pain near the anus) TENDERNESS IN MOUTH AND THROAT WITH OR WITHOUT PRESENCE OF ULCERS (sore throat, sores in mouth, or a toothache) UNUSUAL RASH, SWELLING OR PAIN  UNUSUAL VAGINAL DISCHARGE OR ITCHING   Items with * indicate a potential emergency and should be followed up as soon as possible or go to the Emergency Department if any problems should occur.  Please show the CHEMOTHERAPY ALERT CARD or IMMUNOTHERAPY ALERT CARD at check-in to the Emergency Department and triage nurse.  Should you have questions after your visit or need to cancel or reschedule your appointment, please contact [facility] [phone]  and follow the prompts.  Office hours are 8:00 a.m. to 4:30 p.m. Monday - Friday. Please note that voicemails left after 4:00 p.m. may not be returned until the following business day.  We are closed weekends and major  holidays. You have access to a nurse at all times for urgent questions. Please call the main number to the clinic [phone] and follow the prompts.  For any non-urgent questions, you may also contact your provider using MyChart. We now offer e-Visits for anyone 110 and older to request care online for non-urgent symptoms. For details visit [email or web address].   Also download the MyChart app! Go to the app store, search "MyChart", open the app, select [facility], and log in with your MyChart username and password.  Masks are optional in the cancer centers. If you would like for your care team to wear a mask while they are taking care of you, please let them know. For doctor visits, patients may have with them one support person who is at least 63 years old. At this time, visitors are not allowed in the infusion area.

## 2022-06-10 NOTE — Progress Notes (Signed)
Patient tolerated iron infusion with no complaints voiced.  Peripheral IV site clean and dry with good blood return noted before and after infusion.  Band aid applied.  VSS with discharge and left in satisfactory condition with no s/s of distress noted.   

## 2022-06-11 ENCOUNTER — Ambulatory Visit (HOSPITAL_COMMUNITY)
Admission: RE | Admit: 2022-06-11 | Discharge: 2022-06-11 | Disposition: A | Discharge status: Home / Self Care | Payer: Federal, State, Local not specified - PPO | Source: Ambulatory Visit | Attending: Physician Assistant | Admitting: Physician Assistant

## 2022-06-11 DIAGNOSIS — I8290 Acute embolism and thrombosis of unspecified vein: Secondary | ICD-10-CM | POA: Insufficient documentation

## 2022-06-11 DIAGNOSIS — M7989 Other specified soft tissue disorders: Secondary | ICD-10-CM | POA: Diagnosis not present

## 2022-06-11 DIAGNOSIS — R600 Localized edema: Secondary | ICD-10-CM | POA: Diagnosis not present

## 2022-06-11 DIAGNOSIS — L0390 Cellulitis, unspecified: Secondary | ICD-10-CM | POA: Diagnosis not present

## 2022-06-18 DIAGNOSIS — I2510 Atherosclerotic heart disease of native coronary artery without angina pectoris: Secondary | ICD-10-CM | POA: Diagnosis not present

## 2022-06-24 ENCOUNTER — Other Ambulatory Visit: Payer: Self-pay

## 2022-06-24 ENCOUNTER — Emergency Department (HOSPITAL_COMMUNITY): Payer: Federal, State, Local not specified - PPO

## 2022-06-24 ENCOUNTER — Emergency Department (HOSPITAL_COMMUNITY)
Admission: EM | Admit: 2022-06-24 | Discharge: 2022-06-24 | Disposition: A | Discharge status: Home / Self Care | Payer: Federal, State, Local not specified - PPO | Attending: Emergency Medicine | Admitting: Emergency Medicine

## 2022-06-24 ENCOUNTER — Encounter (HOSPITAL_COMMUNITY): Payer: Self-pay

## 2022-06-24 DIAGNOSIS — R739 Hyperglycemia, unspecified: Secondary | ICD-10-CM

## 2022-06-24 DIAGNOSIS — R0609 Other forms of dyspnea: Secondary | ICD-10-CM | POA: Insufficient documentation

## 2022-06-24 DIAGNOSIS — M7989 Other specified soft tissue disorders: Secondary | ICD-10-CM | POA: Diagnosis not present

## 2022-06-24 DIAGNOSIS — R059 Cough, unspecified: Secondary | ICD-10-CM | POA: Diagnosis not present

## 2022-06-24 DIAGNOSIS — R0602 Shortness of breath: Secondary | ICD-10-CM | POA: Diagnosis not present

## 2022-06-24 DIAGNOSIS — E1165 Type 2 diabetes mellitus with hyperglycemia: Secondary | ICD-10-CM | POA: Diagnosis not present

## 2022-06-24 DIAGNOSIS — J209 Acute bronchitis, unspecified: Secondary | ICD-10-CM

## 2022-06-24 DIAGNOSIS — Z20822 Contact with and (suspected) exposure to covid-19: Secondary | ICD-10-CM | POA: Diagnosis not present

## 2022-06-24 DIAGNOSIS — Z951 Presence of aortocoronary bypass graft: Secondary | ICD-10-CM | POA: Insufficient documentation

## 2022-06-24 DIAGNOSIS — R531 Weakness: Secondary | ICD-10-CM | POA: Diagnosis not present

## 2022-06-24 DIAGNOSIS — R509 Fever, unspecified: Secondary | ICD-10-CM | POA: Diagnosis not present

## 2022-06-24 LAB — BASIC METABOLIC PANEL
Anion gap: 8 (ref 5–15)
BUN: 21 mg/dL (ref 8–23)
CO2: 27 mmol/L (ref 22–32)
Calcium: 8.7 mg/dL — ABNORMAL LOW (ref 8.9–10.3)
Chloride: 98 mmol/L (ref 98–111)
Creatinine, Ser: 1.11 mg/dL (ref 0.61–1.24)
GFR, Estimated: >60 mL/min (ref >60)
Glucose, Bld: 359 mg/dL — ABNORMAL HIGH (ref 70–99)
Potassium: 4.4 mmol/L (ref 3.5–5.1)
Sodium: 133 mmol/L — ABNORMAL LOW (ref 135–145)

## 2022-06-24 LAB — CBC
HCT: 37.7 % — ABNORMAL LOW (ref 39.0–52.0)
Hemoglobin: 12.9 g/dL — ABNORMAL LOW (ref 13.0–17.0)
MCH: 32.9 pg (ref 26.0–34.0)
MCHC: 34.2 g/dL (ref 30.0–36.0)
MCV: 96.2 fL (ref 80.0–100.0)
RBC: 3.92 MIL/uL — ABNORMAL LOW (ref 4.22–5.81)
RDW: 15.7 % — ABNORMAL HIGH (ref 11.5–15.5)
WBC: 5.5 10*3/uL (ref 4.0–10.5)
nRBC: 0 % (ref 0.0–0.2)

## 2022-06-24 LAB — URINALYSIS, ROUTINE W REFLEX MICROSCOPIC
Bilirubin Urine: NEGATIVE
Glucose, UA: >=500 mg/dL — AB
Hgb urine dipstick: NEGATIVE
Ketones, ur: NEGATIVE mg/dL
Nitrite: NEGATIVE
Protein, ur: NEGATIVE mg/dL
Specific Gravity, Urine: 1.02 (ref 1.005–1.030)
pH: 7 (ref 5.0–8.0)

## 2022-06-24 LAB — HEPATIC FUNCTION PANEL
ALT: 16 U/L (ref 0–44)
AST: 35 U/L (ref 15–41)
Albumin: 3.6 g/dL (ref 3.5–5.0)
Alkaline Phosphatase: 86 U/L (ref 38–126)
Bilirubin, Direct: 0.7 mg/dL — ABNORMAL HIGH (ref 0.0–0.2)
Indirect Bilirubin: 2.2 mg/dL — ABNORMAL HIGH (ref 0.3–0.9)
Total Bilirubin: 2.9 mg/dL — ABNORMAL HIGH (ref 0.3–1.2)
Total Protein: 7.5 g/dL (ref 6.5–8.1)

## 2022-06-24 LAB — BRAIN NATRIURETIC PEPTIDE: B Natriuretic Peptide: 74 pg/mL (ref 0.0–100.0)

## 2022-06-24 LAB — RESP PANEL BY RT-PCR (FLU A&B, COVID) ARPGX2
Influenza A by PCR: NEGATIVE
Influenza B by PCR: NEGATIVE
SARS Coronavirus 2 by RT PCR: NEGATIVE

## 2022-06-24 LAB — CBG MONITORING, ED: Glucose-Capillary: 339 mg/dL — ABNORMAL HIGH (ref 70–99)

## 2022-06-24 MED ORDER — SODIUM CHLORIDE 0.9 % IV BOLUS
500.0000 mL | Freq: Once | INTRAVENOUS | Status: AC
Start: 2022-06-24 — End: 2022-06-24
  Administered 2022-06-24: 500 mL via INTRAVENOUS

## 2022-06-24 MED ORDER — ALBUTEROL SULFATE HFA 108 (90 BASE) MCG/ACT IN AERS
2.0000 | INHALATION_SPRAY | Freq: Once | RESPIRATORY_TRACT | Status: AC
  Administered 2022-06-24: 2 via RESPIRATORY_TRACT
  Filled 2022-06-24: qty 6.7

## 2022-06-24 MED ORDER — IPRATROPIUM-ALBUTEROL 0.5-2.5 (3) MG/3ML IN SOLN
3.0000 mL | Freq: Once | RESPIRATORY_TRACT | Status: AC
  Administered 2022-06-24: 3 mL via RESPIRATORY_TRACT
  Filled 2022-06-24: qty 3

## 2022-06-24 NOTE — ED Triage Notes (Signed)
Pt presents to ED with complaints of increased SOB, dizziness and non productive cough x couple of days.

## 2022-06-24 NOTE — ED Provider Notes (Signed)
[facility] EMERGENCY DEPARTMENT Provider Note   CSN: [id number] Arrival date & time: 06/24/22  1604     History  Chief Complaint  Patient presents with   Weakness    [name] is a 61 y.o. male.  HPI 61 year old male presents with cough, fatigue/weakness and dyspnea. Symptoms have been present for about 5 days. Progressively worsening.  Had a subjective fever.  No chest pain but he gets exertional dyspnea.  Chronic leg swelling that is unchanged from baseline.  Nonproductive but significant cough.  No obvious wheezing.  No sore throat.  Denies abdominal pain or vomiting.  Feels generally weak/fatigued.  Home Medications Prior to Admission medications   Medication Sig Start Date End Date Taking? Authorizing Provider  acetaminophen (TYLENOL) 325 MG tablet Take 650 mg by mouth every 6 (six) hours as needed for mild pain. 12/19/20  Yes [provider]  albuterol (VENTOLIN HFA) 108 (90 Base) MCG/ACT inhaler Inhale 2 puffs into the lungs every 6 (six) hours as needed for wheezing or shortness of breath. 12/22/20  Yes Gerlene Fee, NP  ALPRAZolam Duanne Moron) 0.25 MG tablet Take by mouth. 02/20/21  Yes [provider]  Armodafinil 50 MG tablet Take 1 tablet (50 mg total) by mouth as needed. 06/03/22  Yes Dohmeier, Asencion Partridge, MD  aspirin EC 81 MG tablet Take 1 tablet (81 mg total) by mouth daily with breakfast. Patient taking differently: Take 81 mg by mouth every evening. 08/25/21  Yes Emokpae, Courage, MD  budesonide-formoterol (SYMBICORT) 160-4.5 MCG/ACT inhaler Inhale 2 puffs into the lungs 2 (two) times daily. 02/12/22  Yes [provider]  Cholecalciferol (DIALYVITE VITAMIN D 5000) 125 MCG (5000 UT) capsule Take 5,000 Units by mouth in the morning.    Yes [provider]  COMBIVENT RESPIMAT 20-100 MCG/ACT AERS respimat Inhale 1 puff into the lungs 4 (four) times daily. 03/25/22  Yes [provider]  ferrous sulfate 325 (65 FE) MG tablet Take 1  tablet (325 mg total) by mouth daily with breakfast. 08/25/21  Yes Emokpae, Courage, MD  insulin aspart (NOVOLOG) 100 UNIT/ML injection Inject 5 Units into the skin 3 (three) times daily before meals. Special Instructions: If accu-check is greater than 150. Hold for accu-check 150 or below. With Meals 12/22/20  Yes Gerlene Fee, NP  insulin degludec (TRESIBA FLEXTOUCH) 200 UNIT/ML FlexTouch Pen Inject 40 Units into the skin in the morning. Give 30 units Subcutaneous  at Bedtime 8:00 pm Patient taking differently: Inject 30 Units into the skin See admin instructions. Give 30 units Subcutaneous  every evening 12/22/20  Yes Gerlene Fee, NP  lactulose (CHRONULAC) 10 GM/15ML solution Take 30 mLs (20 g total) by mouth daily. 12/22/20  Yes Gerlene Fee, NP  loratadine (CLARITIN) 10 MG tablet Take 10 mg by mouth in the morning.    Yes [provider]  magnesium oxide (MAG-OX) 400 (240 Mg) MG tablet Take 1 tablet (400 mg total) by mouth daily. 08/26/21  Yes Emokpae, Courage, MD  midodrine (PROAMATINE) 10 MG tablet Take 1 tablet (10 mg total) by mouth 3 (three) times daily with meals. 08/25/21  Yes Emokpae, Courage, MD  nadolol (CORGARD) 20 MG tablet Take 1 tablet (20 mg total) by mouth daily. 06/07/22  Yes Imogene Burn, PA-C  nitroGLYCERIN (NITROSTAT) 0.4 MG SL tablet Place 1 tablet (0.4 mg total) under the tongue every 5 (five) minutes as needed for chest pain. Do not exceed 3 doses in 15 mins 11/04/21 06/24/22  Yes Hilty, Nadean Corwin, MD  OZEMPIC, 0.25 OR 0.5 MG/DOSE, 2 MG/1.5ML SOPN Inject into the skin. 10/20/21  Yes [provider]  pantoprazole (PROTONIX) 40 MG tablet Take 1 tablet (40 mg total) by mouth daily. 08/25/21 08/25/22 Yes Emokpae, Courage, MD  potassium chloride SA (KLOR-CON M) 20 MEQ tablet Take 20 mEq by mouth daily. 03/22/22  Yes [provider]  pravastatin (PRAVACHOL) 20 MG tablet TAKE 1 TABLET(20 MG) BY MOUTH AT BEDTIME Patient taking differently: Take 20 mg  by mouth at bedtime. 03/22/22  Yes Strader, [place] M, PA-C  propranolol (INDERAL) 40 MG tablet Take 40 mg by mouth daily. 05/31/22  Yes [provider]  spironolactone (ALDACTONE) 25 MG tablet Take 0.5 tablets (12.5 mg total) by mouth daily. 08/26/21  Yes Emokpae, Courage, MD  torsemide (DEMADEX) 20 MG tablet Take 1 tablet (20 mg total) by mouth daily. 08/26/21  Yes Emokpae, Courage, MD  traMADol (ULTRAM) 50 MG tablet Take 1 tablet (50 mg total) by mouth every 6 (six) hours as needed. 01/07/22  Yes Carole Civil, MD  venlafaxine XR (EFFEXOR-XR) 75 MG 24 hr capsule Take 1 capsule (75 mg total) by mouth daily with breakfast. 08/25/21  Yes Emokpae, Courage, MD  ACCU-CHEK GUIDE test strip 3 (three) times daily. 02/12/22   [provider]  BD PEN NEEDLE NANO 2ND GEN 32G X 4 MM MISC  04/24/22   [provider]  Continuous Blood Gluc Sensor (FREESTYLE LIBRE 14 DAY SENSOR) MISC SMARTSIG:1 Each Topical Every 2 Weeks 10/03/21   [provider]      Allergies    Fluticasone    Review of Systems   Review of Systems  Constitutional:  Positive for fever.  Respiratory:  Positive for cough and shortness of breath.   Cardiovascular:  Positive for leg swelling. Negative for chest pain.  Gastrointestinal:  Negative for abdominal pain and vomiting.    Physical Exam Updated Vital Signs BP 136/79   Pulse 75   Temp 98.3 F (36.8 C) (Oral)   Resp (!) 22   Ht 5' 10.5" (1.791 m)   Wt 111.1 kg   SpO2 97%   BMI 34.66 kg/m  Physical Exam Vitals and nursing note reviewed.  Constitutional:      Appearance: He is well-developed. He is not ill-appearing or diaphoretic.  HENT:     Head: Normocephalic and atraumatic.  Cardiovascular:     Rate and Rhythm: Normal rate and regular rhythm.     Heart sounds: Murmur heard.  Pulmonary:     Effort: Pulmonary effort is normal.     Breath sounds: Normal breath sounds. No wheezing.     Comments: Perhaps mild decreased breath sounds  at bases Abdominal:     Palpations: Abdomen is soft.     Tenderness: There is no abdominal tenderness.  Musculoskeletal:     Right lower leg: Edema present.     Left lower leg: Edema present.  Skin:    General: Skin is warm and dry.  Neurological:     Mental Status: He is alert.     Comments: 5/5 strength in all 4 extremities.  Normal speech     ED Results / Procedures / Treatments   Labs (all labs ordered are listed, but only abnormal results are displayed) Labs Reviewed  BASIC METABOLIC PANEL - Abnormal; Notable for the following components:      Result Value   Sodium 133 (*)    Glucose, Bld 359 (*)  Calcium 8.7 (*)    All other components within normal limits  CBC - Abnormal; Notable for the following components:   RBC 3.92 (*)    Hemoglobin 12.9 (*)    HCT 37.7 (*)    RDW 15.7 (*)    All other components within normal limits  URINALYSIS, ROUTINE W REFLEX MICROSCOPIC - Abnormal; Notable for the following components:   Glucose, UA >=500 (*)    Leukocytes,Ua TRACE (*)    Bacteria, UA RARE (*)    All other components within normal limits  HEPATIC FUNCTION PANEL - Abnormal; Notable for the following components:   Total Bilirubin 2.9 (*)    Bilirubin, Direct 0.7 (*)    Indirect Bilirubin 2.2 (*)    All other components within normal limits  CBG MONITORING, ED - Abnormal; Notable for the following components:   Glucose-Capillary 339 (*)    All other components within normal limits  RESP PANEL BY RT-PCR (FLU A&B, COVID) ARPGX2  BRAIN NATRIURETIC PEPTIDE    EKG EKG Interpretation  Date/Time:  Thursday June 24 2022 16:41:26 EDT Ventricular Rate:  74 PR Interval:  152 QRS Duration: 80 QT Interval:  388 QTC Calculation: 430 R Axis:   72 Text Interpretation: Normal sinus rhythm Low voltage QRS Cannot rule out Anterior infarct , age undetermined similar to Nov 2022 Confirmed by Sherwood Gambler [phone]) on 06/24/2022 5:21:30 PM  Radiology DG Chest 2 View  Result  Date: 06/24/2022 CLINICAL DATA:  Cough cough fever EXAM: CHEST - 2 VIEW COMPARISON:  03/08/2022 FINDINGS: Prior CABG. Heart and mediastinal contours are within normal limits. No focal opacities or effusions. No acute bony abnormality. IMPRESSION: No active cardiopulmonary disease. Electronically Signed   By: Rolm Baptise M.D.   On: 06/24/2022 19:00    Procedures Procedures    Medications Ordered in ED Medications  ipratropium-albuterol (DUONEB) 0.5-2.5 (3) MG/3ML nebulizer solution 3 mL (3 mLs Nebulization Given 06/24/22 2125)  sodium chloride 0.9 % bolus 500 mL (500 mLs Intravenous New Bag/Given 06/24/22 2315)  albuterol (VENTOLIN HFA) 108 (90 Base) MCG/ACT inhaler 2 puff (2 puffs Inhalation Given 06/24/22 2315)    ED Course/ Medical Decision Making/ A&P                           Medical Decision Making Amount and/or Complexity of Data Reviewed Independent Historian: spouse Labs: ordered.    Details: Normal WBC, normal BNP and hyperglycemia without acidosis.  Normal renal function. Radiology: ordered and independent interpretation performed.    Details: No pneumonia on x-ray ECG/medicine tests: ordered and independent interpretation performed.    Details: No acute ischemia  Risk Prescription drug management.   Patient does not appear ill.  Perhaps some mild tachypnea but O2 sats are okay.  X-ray shows no pneumonia.  Labs are reassuring besides some hyperglycemia without acidosis.  COVID/flu testing negative.  He has chronic leg swelling and is on torsemide but this does not appear consistent with heart failure at this time.  Probably has a bronchitis as he does use albuterol at home and feels better after neb here.  He ran out of his inhaler so I will give him 1 here.  Otherwise he feels like he might be dehydrated which could be true with the hyperglycemia and so he was given a small fluid bolus given his known history of CHF.  Otherwise, he ambulated without difficulty or hypoxia.  Low  suspicion this is a bacterial infection.  Will discharge home with return precautions.  While he does have some bronchitis, he has hyperglycemia and a fairly mild course so I do not think steroids are warranted as this would probably do more harm than good.        Final Clinical Impression(s) / ED Diagnoses Final diagnoses:  Acute bronchitis, unspecified organism  Hyperglycemia    Rx / DC Orders ED Discharge Orders     None         Sherwood Gambler, MD 06/24/22 2347

## 2022-06-24 NOTE — Discharge Instructions (Signed)
Return to the ER if you develop new or worsening shortness of breath, coughing up blood, fever, chest pain, or any other new/concerning symptoms.

## 2022-06-30 DIAGNOSIS — J40 Bronchitis, not specified as acute or chronic: Secondary | ICD-10-CM | POA: Diagnosis not present

## 2022-06-30 DIAGNOSIS — Z6837 Body mass index (BMI) 37.0-37.9, adult: Secondary | ICD-10-CM | POA: Diagnosis not present

## 2022-06-30 DIAGNOSIS — E6609 Other obesity due to excess calories: Secondary | ICD-10-CM | POA: Diagnosis not present

## 2022-07-04 NOTE — Progress Notes (Unsigned)
Office Visit    Patient Name: [name] Date of Encounter: 07/05/2022  PCP:  [name], [facility]  Cardiologist:  Rozann Lesches, MD  Advanced Practice Provider:  No care team member to display Electrophysiologist:  None      Chief Complaint    [name] is a 61 y.o. male presents today for follow up of LE edema   Past Medical History    Past Medical History:  Diagnosis Date   Anemia    Arthritis    CAD (coronary artery disease)    Multivessel disease status post CABG 08/2015   Cirrhosis ([place])    CKD (chronic kidney disease) stage 3, GFR 30-59 ml/min (HCC)    Diastolic CHF ([place])    Essential hypertension    Hyperlipidemia    Iron deficiency anemia 07/15/2021   OSA on CPAP    Polyclonal gammopathy    Thrombocytopenia ([place]) 2016   Type 2 diabetes mellitus ([place])    Past Surgical History:  Procedure Laterality Date   APPENDECTOMY     Biceps tendon surgery Right    BIOPSY  11/22/2019   Procedure: BIOPSY;  Surgeon: Daneil Dolin, MD;  [facility];  Service: Endoscopy;;   CARDIAC CATHETERIZATION N/A 08/25/2015   Procedure: Left Heart Cath and Coronary Angiography;  Surgeon: Belva Crome, MD;  Location: [facility];  Service: Cardiovascular;  Laterality: N/A;   COLONOSCOPY WITH PROPOFOL N/A 11/22/2019   Procedure: COLONOSCOPY WITH PROPOFOL;  Surgeon: Daneil Dolin, MD; Four 4-5 mm polyps, findings suggestive of portal colopathy, congested appearing colonic mucosa diffusely, rectal varices, and adequate right colon prep.  Pathology with tubular adenomas and hyperplastic polyp.  Right colon biopsy with focal active colitis.  Recommendations to repeat colonoscopy in 3 months due to poor prep.   COLONOSCOPY WITH PROPOFOL N/A 03/17/2020   Procedure: COLONOSCOPY WITH PROPOFOL;  Surgeon: Daneil Dolin, MD;  [facility];  Service: Endoscopy;  Laterality: N/A;  8:45am - pt does not need covid  test, was + 2/4 <90 days   CORONARY ARTERY BYPASS GRAFT N/A 08/29/2015   Procedure: CORONARY ARTERY BYPASS GRAFTING (CABG);  Surgeon: [name], MD;  Location: [facility];  Service: Open Heart Surgery;  Laterality: N/A;   ESOPHAGEAL BANDING N/A 07/23/2021   Procedure: ESOPHAGEAL BANDING;  Surgeon: Eloise Harman, DO;  [facility];  Service: Endoscopy;  Laterality: N/A;   ESOPHAGOGASTRODUODENOSCOPY (EGD) WITH PROPOFOL N/A 11/22/2019   Procedure: ESOPHAGOGASTRODUODENOSCOPY (EGD) WITH PROPOFOL;  Surgeon: Daneil Dolin, MD; 4 columns of grade 2-3 esophageal varices, portal gastropathy, gastric polyp/abnormal gastric mucosa s/p biopsy.  Pathology with hyperplastic polyp, mild chronic gastritis, negative H. pylori.   ESOPHAGOGASTRODUODENOSCOPY (EGD) WITH PROPOFOL N/A 07/23/2021   Grade 2 esophageal varices without stigmata of bleeding, completely eradicated with banding, portal gastropathy, GAVE without bleeding.   KNEE ARTHROSCOPY Left    TEE WITHOUT CARDIOVERSION N/A 08/29/2015   Procedure: TRANSESOPHAGEAL ECHOCARDIOGRAM (TEE);  Surgeon: [name], MD;  Location: [facility];  Service: Open Heart Surgery;  Laterality: N/A;   TOTAL KNEE ARTHROPLASTY Left 03/09/2017   Procedure: TOTAL KNEE ARTHROPLASTY;  Surgeon: Carole Civil, MD;  Location: [facility];  Service: Orthopedics;  Laterality: Left;    Allergies  Allergies  Allergen Reactions   Fluticasone Rash    History of Present Illness    [name] is a 61 y.o. male with a hx  of CAD (s/p CABG 2016, PET perfousion 01/2021 inferior scar and mild ischemia with medical mgmt recommended), HFpEF, aortic stenosis, hepatic cirrhosis, adrenal insufficiency, HTN, HLD, Dm2, CKD3, OSA, anemia, thrombocytopenia, polyclonal gammopathy last seen 06/07/22.  Seen 10/2021 by Dr. Debara Pickett after ED vsiit for chest pain. Enzymes negative and symptoms atypical for angina. Seen by Bernerd Pho, PA 01/2022 and was stable. Neurology  inquired about armodafinil, but our clinical pharmacist and Dr. Domenic Polite felt cardiac side effects not acceptable to start.   Seen 06/07/22 by Estella Husk, PA due to LE edema. Mostly in right leg which was warm, swollen. Noted high sodium intake with canned soups, hamburger helper, take out. Had been out of Nadolol and K for one week. He was recommended to increase Torsemide to '40mg'$  x 3 days then return to '20mg'$  daily. LE duplex with no DVT but did not superficial subcutaneous edema/cellulitis. Further escalation of diuretics deferred due to intermittent orthostatic hypotension for which he takes Midodrine TID. Echo 05/2022 normal LVEF 60-65%, mild LVH, gr1DD, moderate AS(mean gradient 25.0 mmHg).   ED visit 06/24/22 with cough, fatigue, dyspnea diagnosed with bronchitis. Treated with inhaler. As case was mild and concern for hyperglycemia, steroid deferred.   He presents today for follow up. *** Weight is down 9 pounds.   BP at home 165/68 HR 70  Feeling about 80% percent better. Cough is almost gone. He is taking cough syrup at night time as it makes him sleepy.     EKGs/Labs/Other Studies Reviewed:   The following studies were reviewed today:  Echo 05/2022    1. Left ventricular ejection fraction, by estimation, is 60 to 65%. The  left ventricle has normal function. Left ventricular endocardial border  not optimally defined to evaluate regional wall motion. There is mild left  ventricular hypertrophy. Left  ventricular diastolic parameters are consistent with Grade I diastolic  dysfunction (impaired relaxation).   2. Right ventricular systolic function is normal. The right ventricular  size is normal. There is normal pulmonary artery systolic pressure.   3. The mitral valve is abnormal. Trivial mitral valve regurgitation. No  evidence of mitral stenosis.   4. The tricuspid valve is abnormal.   5. The aortic valve has an indeterminant number of cusps. There is severe  calcifcation of  the aortic valve. There is severe thickening of the aortic  valve. Aortic valve regurgitation is not visualized. Moderate aortic valve  stenosis. Aortic valve mean  gradient measures 25.0 mmHg. Aortic valve peak gradient measures 39.4  mmHg. Aortic valve area, by VTI measures 1.08 cm.   6. The inferior vena cava is normal in size with greater than 50%  respiratory variability, suggesting right atrial pressure of 3 mmHg.  Echocardiogram: 05/2020 IMPRESSIONS     1. Left ventricular ejection fraction, by estimation, is 60 to 65%. The  left ventricle has normal function. The left ventricle has no regional  wall motion abnormalities. The left ventricular internal cavity size was  mildly dilated. There is mild left  ventricular hypertrophy. Left ventricular diastolic parameters are  indeterminate.   2. Right ventricular systolic function is normal. The right ventricular  size is normal. There is normal pulmonary artery systolic pressure.   3. The mitral valve is abnormal. Mild mitral valve regurgitation.   4. AV is thickened, calcifieid Difficult to see well. Peak and mean  gradients through the valve are 31 and 18 mm Hg respectively consistent  with mild AS. Compared to echo report from 2019, gradients are increased.  [name]  The aortic valve is abnormal. Aortic  valve regurgitation is not visualized. Mild aortic valve stenosis.   5. The inferior vena cava is dilated in size with <50% respiratory  variability, suggesting right atrial pressure of 15 mmHg.    Carotid Dopplers: 05/2020 Summary:  Right Carotid: Velocities in the right ICA are consistent with a 1-39%  stenosis.   Left Carotid: Velocities in the left ICA are consistent with a 40-59%  stenosis.   Echocardiogram: 12/2020 NORMAL LEFT VENTRICULAR SYSTOLIC FUNCTION    NORMAL RIGHT VENTRICULAR SYSTOLIC FUNCTION    NO VALVULAR REGURGITATION    VALVULAR STENOSIS: MILD AS    POOR SOUND TRANSMISSION     EKG:  No EKG today.    Recent Labs: 08/22/2021: Magnesium 1.9 06/24/2022: ALT 16; B Natriuretic Peptide 74.0; BUN 21; Creatinine, Ser 1.11; Hemoglobin 12.9; Platelets DCLMP; Potassium 4.4; Sodium 133  Recent Lipid Panel    Component Value Date/Time   CHOL 120 05/25/2019 1001   TRIG 136 05/25/2019 1001   HDL 38 (L) 05/25/2019 1001   CHOLHDL 3.2 05/25/2019 1001   VLDL 27 05/25/2019 1001   LDLCALC 55 05/25/2019 1001    Home Medications   Current Meds  Medication Sig   ACCU-CHEK GUIDE test strip 3 (three) times daily.   acetaminophen (TYLENOL) 325 MG tablet Take 650 mg by mouth every 6 (six) hours as needed for mild pain.   albuterol (VENTOLIN HFA) 108 (90 Base) MCG/ACT inhaler Inhale 2 puffs into the lungs every 6 (six) hours as needed for wheezing or shortness of breath.   ALPRAZolam (XANAX) 0.25 MG tablet Take by mouth.   Armodafinil 50 MG tablet Take 1 tablet (50 mg total) by mouth as needed.   aspirin EC 81 MG tablet Take 1 tablet (81 mg total) by mouth daily with breakfast. (Patient taking differently: Take 81 mg by mouth every evening.)   BD PEN NEEDLE NANO 2ND GEN 32G X 4 MM MISC    budesonide-formoterol (SYMBICORT) 160-4.5 MCG/ACT inhaler Inhale 2 puffs into the lungs 2 (two) times daily.   Cholecalciferol (DIALYVITE VITAMIN D 5000) 125 MCG (5000 UT) capsule Take 5,000 Units by mouth in the morning.    COMBIVENT RESPIMAT 20-100 MCG/ACT AERS respimat Inhale 1 puff into the lungs 4 (four) times daily.   Continuous Blood Gluc Sensor (FREESTYLE LIBRE 14 DAY SENSOR) MISC SMARTSIG:1 Each Topical Every 2 Weeks   ferrous sulfate 325 (65 FE) MG tablet Take 1 tablet (325 mg total) by mouth daily with breakfast.   insulin aspart (NOVOLOG) 100 UNIT/ML injection Inject 5 Units into the skin 3 (three) times daily before meals. Special Instructions: If accu-check is greater than 150. Hold for accu-check 150 or below. With Meals   insulin degludec (TRESIBA FLEXTOUCH) 200 UNIT/ML FlexTouch Pen Inject 40 Units into the  skin in the morning. Give 30 units Subcutaneous  at Bedtime 8:00 pm (Patient taking differently: Inject 30 Units into the skin See admin instructions. Give 30 units Subcutaneous  every evening)   lactulose (CHRONULAC) 10 GM/15ML solution Take 30 mLs (20 g total) by mouth daily.   loratadine (CLARITIN) 10 MG tablet Take 10 mg by mouth in the morning.    magnesium oxide (MAG-OX) 400 (240 Mg) MG tablet Take 1 tablet (400 mg total) by mouth daily.   midodrine (PROAMATINE) 10 MG tablet Take 1 tablet (10 mg total) by mouth 3 (three) times daily with meals.   nadolol (CORGARD) 20 MG tablet Take 1 tablet (20 mg total) by  mouth daily.   OZEMPIC, 0.25 OR 0.5 MG/DOSE, 2 MG/1.5ML SOPN Inject into the skin.   pantoprazole (PROTONIX) 40 MG tablet Take 1 tablet (40 mg total) by mouth daily.   potassium chloride SA (KLOR-CON M) 20 MEQ tablet Take 20 mEq by mouth daily.   pravastatin (PRAVACHOL) 20 MG tablet TAKE 1 TABLET(20 MG) BY MOUTH AT BEDTIME (Patient taking differently: Take 20 mg by mouth at bedtime.)   propranolol (INDERAL) 40 MG tablet Take 40 mg by mouth daily.   spironolactone (ALDACTONE) 25 MG tablet Take 0.5 tablets (12.5 mg total) by mouth daily.   torsemide (DEMADEX) 20 MG tablet Take 1 tablet (20 mg total) by mouth daily.   traMADol (ULTRAM) 50 MG tablet Take 1 tablet (50 mg total) by mouth every 6 (six) hours as needed.   venlafaxine XR (EFFEXOR-XR) 75 MG 24 hr capsule Take 1 capsule (75 mg total) by mouth daily with breakfast.     Review of Systems      All other systems reviewed and are otherwise negative except as noted above.  Physical Exam    VS:  BP 130/76   Pulse 75   Ht 5' 10.5" (1.791 m)   Wt 249 lb (112.9 kg)   BMI 35.22 kg/m  , BMI Body mass index is 35.22 kg/m.  Wt Readings from Last 3 Encounters:  07/05/22 249 lb (112.9 kg)  06/24/22 245 lb (111.1 kg)  06/09/22 258 lb 2.5 oz (117.1 kg)     GEN: Well nourished, well developed, in no acute distress. HEENT:  normal. Neck: Supple, no JVD, carotid bruits, or masses. Cardiac: ***RRR, no murmurs, rubs, or gallops. No clubbing, cyanosis, edema.  ***Radials/PT 2+ and equal bilaterally.  Respiratory:  ***Respirations regular and unlabored, clear to auscultation bilaterally. GI: Soft, nontender, nondistended. MS: No deformity or atrophy. Skin: Warm and dry, no rash. Neuro:  Strength and sensation are intact. Psych: Normal affect.  Assessment & Plan    CAD - CABG in 2016 and PET perfusion study 01/2021 inferior scar and mild ischemia with medical management recommended. GDMT aspirin, Nadolol, Pravastatin. Heart healthy diet and regular cardiovascular exercise encouraged.  ***  HFpEF -   AS - Mild by echo 05/2020. Repeat echo 05/2022 moderate AS. Monitor with annual echo. Continue optimal BP control.   Heppatic cirrhosis - Follows at [facility] for potential liver transplant. GDMT lactulose, spironolactone, nadolol, torsemide. ***  OSA - CPAP compliance encouraged.   Adrenal insufficiency - On Midodrine due to hypotension. ***  Disposition: Follow up {follow up:15908} with Rozann Lesches, MD or APP.  Signed, Loel Dubonnet, NP 07/05/2022, 11:49 AM [employer]

## 2022-07-05 ENCOUNTER — Encounter (HOSPITAL_BASED_OUTPATIENT_CLINIC_OR_DEPARTMENT_OTHER): Payer: Self-pay | Admitting: Family

## 2022-07-05 ENCOUNTER — Ambulatory Visit (INDEPENDENT_AMBULATORY_CARE_PROVIDER_SITE_OTHER): Payer: Federal, State, Local not specified - PPO | Admitting: Family

## 2022-07-05 VITALS — BP 130/76 | HR 75 | Ht 70.5 in | Wt 249.0 lb

## 2022-07-05 DIAGNOSIS — Z951 Presence of aortocoronary bypass graft: Secondary | ICD-10-CM

## 2022-07-05 DIAGNOSIS — I5032 Chronic diastolic (congestive) heart failure: Secondary | ICD-10-CM

## 2022-07-05 DIAGNOSIS — I350 Nonrheumatic aortic (valve) stenosis: Secondary | ICD-10-CM | POA: Diagnosis not present

## 2022-07-05 DIAGNOSIS — G4733 Obstructive sleep apnea (adult) (pediatric): Secondary | ICD-10-CM

## 2022-07-05 DIAGNOSIS — I25118 Atherosclerotic heart disease of native coronary artery with other forms of angina pectoris: Secondary | ICD-10-CM | POA: Diagnosis not present

## 2022-07-05 NOTE — Patient Instructions (Signed)
Medication Instructions:  Continue your current medications.   You may take an extra torsemide as needed for weight gain of 2 pounds overnight or 5 pounds in one week.   *If you need a refill on your cardiac medications before your next appointment, please call your pharmacy*   Lab Work: None ordered today.   Testing/Procedures: Your physician has requested that you have an echocardiogram in one year for monitoring of aortic stenosis. Echocardiography is a painless test that uses sound waves to create images of your heart. It provides your doctor with information about the size and shape of your heart and how well your heart's chambers and valves are working. This procedure takes approximately one hour. There are no restrictions for this procedure.    Follow-Up: At [facility], you and your health needs are our priority.  As part of our continuing mission to provide you with exceptional heart care, we have created designated Provider Care Teams.  These Care Teams include your primary Cardiologist (physician) and Advanced Practice Providers (APPs -  Physician Assistants and Nurse Practitioners) who all work together to provide you with the care you need, when you need it.  We recommend signing up for the patient portal called "MyChart".  Sign up information is provided on this After Visit Summary.  MyChart is used to connect with patients for Virtual Visits (Telemedicine).  Patients are able to view lab/test results, encounter notes, upcoming appointments, etc.  Non-urgent messages can be sent to your provider as well.   To learn more about what you can do with MyChart, go to [email or web address].    Your next appointment:   As scheduled with Dr. Domenic Polite   Other Instructions  Heart Healthy Diet Recommendations: A low-salt diet is recommended. Meats should be grilled, baked, or boiled. Avoid fried foods. Focus on lean protein sources like fish or chicken with vegetables and fruits.  The American Heart Association is a Microbiologist!  American Heart Association Diet and Lifeystyle Recommendations   Exercise recommendations: The American Heart Association recommends 150 minutes of moderate intensity exercise weekly. Try 30 minutes of moderate intensity exercise 4-5 times per week. This could include walking, jogging, or swimming.   Important Information About Sugar

## 2022-07-06 ENCOUNTER — Encounter (HOSPITAL_BASED_OUTPATIENT_CLINIC_OR_DEPARTMENT_OTHER): Payer: Self-pay | Admitting: Family

## 2022-07-07 DIAGNOSIS — I5030 Unspecified diastolic (congestive) heart failure: Secondary | ICD-10-CM | POA: Diagnosis not present

## 2022-07-07 DIAGNOSIS — I719 Aortic aneurysm of unspecified site, without rupture: Secondary | ICD-10-CM | POA: Diagnosis not present

## 2022-07-07 DIAGNOSIS — E559 Vitamin D deficiency, unspecified: Secondary | ICD-10-CM | POA: Diagnosis not present

## 2022-07-07 DIAGNOSIS — Z1331 Encounter for screening for depression: Secondary | ICD-10-CM | POA: Diagnosis not present

## 2022-07-07 DIAGNOSIS — E1140 Type 2 diabetes mellitus with diabetic neuropathy, unspecified: Secondary | ICD-10-CM | POA: Diagnosis not present

## 2022-07-07 DIAGNOSIS — E039 Hypothyroidism, unspecified: Secondary | ICD-10-CM | POA: Diagnosis not present

## 2022-07-07 DIAGNOSIS — Z0001 Encounter for general adult medical examination with abnormal findings: Secondary | ICD-10-CM | POA: Diagnosis not present

## 2022-07-07 DIAGNOSIS — Z125 Encounter for screening for malignant neoplasm of prostate: Secondary | ICD-10-CM | POA: Diagnosis not present

## 2022-07-07 DIAGNOSIS — Z6836 Body mass index (BMI) 36.0-36.9, adult: Secondary | ICD-10-CM | POA: Diagnosis not present

## 2022-07-07 DIAGNOSIS — I25118 Atherosclerotic heart disease of native coronary artery with other forms of angina pectoris: Secondary | ICD-10-CM | POA: Diagnosis not present

## 2022-07-07 DIAGNOSIS — D693 Immune thrombocytopenic purpura: Secondary | ICD-10-CM | POA: Diagnosis not present

## 2022-07-07 DIAGNOSIS — J45909 Unspecified asthma, uncomplicated: Secondary | ICD-10-CM | POA: Diagnosis not present

## 2022-07-07 DIAGNOSIS — E6609 Other obesity due to excess calories: Secondary | ICD-10-CM | POA: Diagnosis not present

## 2022-07-07 DIAGNOSIS — K7581 Nonalcoholic steatohepatitis (NASH): Secondary | ICD-10-CM | POA: Diagnosis not present

## 2022-07-07 DIAGNOSIS — D518 Other vitamin B12 deficiency anemias: Secondary | ICD-10-CM | POA: Diagnosis not present

## 2022-07-07 DIAGNOSIS — I10 Essential (primary) hypertension: Secondary | ICD-10-CM | POA: Diagnosis not present

## 2022-07-09 DIAGNOSIS — E1122 Type 2 diabetes mellitus with diabetic chronic kidney disease: Secondary | ICD-10-CM | POA: Diagnosis not present

## 2022-07-09 DIAGNOSIS — N189 Chronic kidney disease, unspecified: Secondary | ICD-10-CM | POA: Diagnosis not present

## 2022-07-09 DIAGNOSIS — E871 Hypo-osmolality and hyponatremia: Secondary | ICD-10-CM | POA: Diagnosis not present

## 2022-07-09 DIAGNOSIS — I129 Hypertensive chronic kidney disease with stage 1 through stage 4 chronic kidney disease, or unspecified chronic kidney disease: Secondary | ICD-10-CM | POA: Diagnosis not present

## 2022-07-14 DIAGNOSIS — J4531 Mild persistent asthma with (acute) exacerbation: Secondary | ICD-10-CM | POA: Diagnosis not present

## 2022-07-14 DIAGNOSIS — N189 Chronic kidney disease, unspecified: Secondary | ICD-10-CM | POA: Diagnosis not present

## 2022-07-14 DIAGNOSIS — K7460 Unspecified cirrhosis of liver: Secondary | ICD-10-CM | POA: Diagnosis not present

## 2022-07-14 DIAGNOSIS — K219 Gastro-esophageal reflux disease without esophagitis: Secondary | ICD-10-CM | POA: Diagnosis not present

## 2022-07-14 DIAGNOSIS — D472 Monoclonal gammopathy: Secondary | ICD-10-CM | POA: Diagnosis not present

## 2022-07-14 DIAGNOSIS — E1122 Type 2 diabetes mellitus with diabetic chronic kidney disease: Secondary | ICD-10-CM | POA: Diagnosis not present

## 2022-07-14 DIAGNOSIS — E669 Obesity, unspecified: Secondary | ICD-10-CM | POA: Diagnosis not present

## 2022-07-14 DIAGNOSIS — I129 Hypertensive chronic kidney disease with stage 1 through stage 4 chronic kidney disease, or unspecified chronic kidney disease: Secondary | ICD-10-CM | POA: Diagnosis not present

## 2022-07-14 DIAGNOSIS — Z87898 Personal history of other specified conditions: Secondary | ICD-10-CM | POA: Diagnosis not present

## 2022-07-14 DIAGNOSIS — D638 Anemia in other chronic diseases classified elsewhere: Secondary | ICD-10-CM | POA: Diagnosis not present

## 2022-07-14 DIAGNOSIS — K7581 Nonalcoholic steatohepatitis (NASH): Secondary | ICD-10-CM | POA: Diagnosis not present

## 2022-07-14 DIAGNOSIS — D696 Thrombocytopenia, unspecified: Secondary | ICD-10-CM | POA: Diagnosis not present

## 2022-07-28 ENCOUNTER — Telehealth: Payer: Self-pay | Admitting: Cardiology

## 2022-07-28 DIAGNOSIS — L110 Acquired keratosis follicularis: Secondary | ICD-10-CM | POA: Diagnosis not present

## 2022-07-28 DIAGNOSIS — I739 Peripheral vascular disease, unspecified: Secondary | ICD-10-CM | POA: Diagnosis not present

## 2022-07-28 DIAGNOSIS — E1151 Type 2 diabetes mellitus with diabetic peripheral angiopathy without gangrene: Secondary | ICD-10-CM | POA: Diagnosis not present

## 2022-07-28 DIAGNOSIS — M79671 Pain in right foot: Secondary | ICD-10-CM | POA: Diagnosis not present

## 2022-07-28 NOTE — Telephone Encounter (Signed)
Noted  

## 2022-07-28 NOTE — Telephone Encounter (Signed)
Pt c/o medication issue:  1. Name of Medication: Midodrine  2. How are you currently taking this medication (dosage and times per day)?  Do not know how he is supposed to be taking it daily  3. Are you having a reaction (difficulty breathing--STAT)?   4. What is your medication issue?  How is he supposed to take this  medicine?

## 2022-07-28 NOTE — Telephone Encounter (Signed)
Spoke with pt who states that he has been taking Midodrine 20 mg 3 tablets 3 times daily. Pt states that his PCP office wrote the last Rx. Pt encouraged to call their office.

## 2022-08-03 DIAGNOSIS — H52223 Regular astigmatism, bilateral: Secondary | ICD-10-CM | POA: Diagnosis not present

## 2022-08-03 DIAGNOSIS — H5703 Miosis: Secondary | ICD-10-CM | POA: Diagnosis not present

## 2022-08-03 DIAGNOSIS — H2513 Age-related nuclear cataract, bilateral: Secondary | ICD-10-CM | POA: Diagnosis not present

## 2022-08-18 HISTORY — PX: CATARACT EXTRACTION: SUR2

## 2022-08-21 ENCOUNTER — Other Ambulatory Visit: Payer: Self-pay | Admitting: Orthopedic Surgery

## 2022-08-24 DIAGNOSIS — H2512 Age-related nuclear cataract, left eye: Secondary | ICD-10-CM | POA: Insufficient documentation

## 2022-08-25 DIAGNOSIS — E1165 Type 2 diabetes mellitus with hyperglycemia: Secondary | ICD-10-CM | POA: Diagnosis not present

## 2022-08-25 DIAGNOSIS — E1122 Type 2 diabetes mellitus with diabetic chronic kidney disease: Secondary | ICD-10-CM | POA: Diagnosis not present

## 2022-08-25 DIAGNOSIS — G4730 Sleep apnea, unspecified: Secondary | ICD-10-CM | POA: Diagnosis not present

## 2022-08-25 DIAGNOSIS — J45909 Unspecified asthma, uncomplicated: Secondary | ICD-10-CM | POA: Diagnosis not present

## 2022-08-25 DIAGNOSIS — Z7982 Long term (current) use of aspirin: Secondary | ICD-10-CM | POA: Diagnosis not present

## 2022-08-25 DIAGNOSIS — H52222 Regular astigmatism, left eye: Secondary | ICD-10-CM | POA: Diagnosis not present

## 2022-08-25 DIAGNOSIS — Z955 Presence of coronary angioplasty implant and graft: Secondary | ICD-10-CM | POA: Diagnosis not present

## 2022-08-25 DIAGNOSIS — Z7951 Long term (current) use of inhaled steroids: Secondary | ICD-10-CM | POA: Diagnosis not present

## 2022-08-25 DIAGNOSIS — H2512 Age-related nuclear cataract, left eye: Secondary | ICD-10-CM | POA: Diagnosis not present

## 2022-08-25 DIAGNOSIS — N189 Chronic kidney disease, unspecified: Secondary | ICD-10-CM | POA: Diagnosis not present

## 2022-08-25 DIAGNOSIS — I129 Hypertensive chronic kidney disease with stage 1 through stage 4 chronic kidney disease, or unspecified chronic kidney disease: Secondary | ICD-10-CM | POA: Diagnosis not present

## 2022-08-25 DIAGNOSIS — I252 Old myocardial infarction: Secondary | ICD-10-CM | POA: Diagnosis not present

## 2022-08-25 DIAGNOSIS — Z79899 Other long term (current) drug therapy: Secondary | ICD-10-CM | POA: Diagnosis not present

## 2022-08-25 DIAGNOSIS — Z794 Long term (current) use of insulin: Secondary | ICD-10-CM | POA: Diagnosis not present

## 2022-08-25 DIAGNOSIS — K7581 Nonalcoholic steatohepatitis (NASH): Secondary | ICD-10-CM | POA: Diagnosis not present

## 2022-08-30 ENCOUNTER — Encounter: Payer: Self-pay | Admitting: *Deleted

## 2022-09-02 ENCOUNTER — Inpatient Hospital Stay: Payer: Federal, State, Local not specified - PPO | Attending: Hematology

## 2022-09-02 DIAGNOSIS — Z807 Family history of other malignant neoplasms of lymphoid, hematopoietic and related tissues: Secondary | ICD-10-CM | POA: Insufficient documentation

## 2022-09-02 DIAGNOSIS — E2740 Unspecified adrenocortical insufficiency: Secondary | ICD-10-CM | POA: Diagnosis not present

## 2022-09-02 DIAGNOSIS — I5030 Unspecified diastolic (congestive) heart failure: Secondary | ICD-10-CM | POA: Diagnosis not present

## 2022-09-02 DIAGNOSIS — D6959 Other secondary thrombocytopenia: Secondary | ICD-10-CM | POA: Diagnosis not present

## 2022-09-02 DIAGNOSIS — D509 Iron deficiency anemia, unspecified: Secondary | ICD-10-CM | POA: Diagnosis not present

## 2022-09-02 DIAGNOSIS — D696 Thrombocytopenia, unspecified: Secondary | ICD-10-CM

## 2022-09-02 DIAGNOSIS — D631 Anemia in chronic kidney disease: Secondary | ICD-10-CM | POA: Insufficient documentation

## 2022-09-02 DIAGNOSIS — I130 Hypertensive heart and chronic kidney disease with heart failure and stage 1 through stage 4 chronic kidney disease, or unspecified chronic kidney disease: Secondary | ICD-10-CM | POA: Diagnosis not present

## 2022-09-02 DIAGNOSIS — Z808 Family history of malignant neoplasm of other organs or systems: Secondary | ICD-10-CM | POA: Diagnosis not present

## 2022-09-02 DIAGNOSIS — K7460 Unspecified cirrhosis of liver: Secondary | ICD-10-CM | POA: Insufficient documentation

## 2022-09-02 DIAGNOSIS — N183 Chronic kidney disease, stage 3 unspecified: Secondary | ICD-10-CM | POA: Insufficient documentation

## 2022-09-02 DIAGNOSIS — E1122 Type 2 diabetes mellitus with diabetic chronic kidney disease: Secondary | ICD-10-CM | POA: Diagnosis not present

## 2022-09-02 LAB — CBC WITH DIFFERENTIAL/PLATELET
Abs Immature Granulocytes: 0.01 10*3/uL (ref 0.00–0.07)
Basophils Absolute: 0 10*3/uL (ref 0.0–0.1)
Basophils Relative: 1 %
Eosinophils Absolute: 0.1 10*3/uL (ref 0.0–0.5)
Eosinophils Relative: 2 %
HCT: 40.1 % (ref 39.0–52.0)
Hemoglobin: 13.4 g/dL (ref 13.0–17.0)
Immature Granulocytes: 0 %
Lymphocytes Relative: 15 %
Lymphs Abs: 0.8 10*3/uL (ref 0.7–4.0)
MCH: 33.5 pg (ref 26.0–34.0)
MCHC: 33.4 g/dL (ref 30.0–36.0)
MCV: 100.3 fL — ABNORMAL HIGH (ref 80.0–100.0)
Monocytes Absolute: 0.3 10*3/uL (ref 0.1–1.0)
Monocytes Relative: 5 %
Neutro Abs: 4.1 10*3/uL (ref 1.7–7.7)
Neutrophils Relative %: 77 %
Platelets: 113 10*3/uL — ABNORMAL LOW (ref 150–400)
RBC: 4 MIL/uL — ABNORMAL LOW (ref 4.22–5.81)
RDW: 15.6 % — ABNORMAL HIGH (ref 11.5–15.5)
WBC: 5.3 10*3/uL (ref 4.0–10.5)
nRBC: 0 % (ref 0.0–0.2)

## 2022-09-02 LAB — IRON AND TIBC
Iron: 120 ug/dL (ref 45–182)
Saturation Ratios: 39 % (ref 17.9–39.5)
TIBC: 309 ug/dL (ref 250–450)
UIBC: 189 ug/dL

## 2022-09-02 LAB — COMPREHENSIVE METABOLIC PANEL
ALT: 14 U/L (ref 0–44)
AST: 34 U/L (ref 15–41)
Albumin: 3.5 g/dL (ref 3.5–5.0)
Alkaline Phosphatase: 67 U/L (ref 38–126)
Anion gap: 6 (ref 5–15)
BUN: 16 mg/dL (ref 8–23)
CO2: 25 mmol/L (ref 22–32)
Calcium: 8.9 mg/dL (ref 8.9–10.3)
Chloride: 105 mmol/L (ref 98–111)
Creatinine, Ser: 1.14 mg/dL (ref 0.61–1.24)
GFR, Estimated: >60 mL/min (ref >60)
Glucose, Bld: 297 mg/dL — ABNORMAL HIGH (ref 70–99)
Potassium: 4.7 mmol/L (ref 3.5–5.1)
Sodium: 136 mmol/L (ref 135–145)
Total Bilirubin: 2.1 mg/dL — ABNORMAL HIGH (ref 0.3–1.2)
Total Protein: 7.5 g/dL (ref 6.5–8.1)

## 2022-09-02 LAB — FERRITIN: Ferritin: 166 ng/mL (ref 24–336)

## 2022-09-06 ENCOUNTER — Ambulatory Visit (INDEPENDENT_AMBULATORY_CARE_PROVIDER_SITE_OTHER): Payer: Federal, State, Local not specified - PPO

## 2022-09-06 ENCOUNTER — Ambulatory Visit (INDEPENDENT_AMBULATORY_CARE_PROVIDER_SITE_OTHER): Payer: Federal, State, Local not specified - PPO | Admitting: Orthopedic Surgery

## 2022-09-06 ENCOUNTER — Encounter: Payer: Self-pay | Admitting: Orthopedic Surgery

## 2022-09-06 VITALS — Ht 70.5 in | Wt 269.0 lb

## 2022-09-06 DIAGNOSIS — Z6839 Body mass index (BMI) 39.0-39.9, adult: Secondary | ICD-10-CM | POA: Diagnosis not present

## 2022-09-06 DIAGNOSIS — E1140 Type 2 diabetes mellitus with diabetic neuropathy, unspecified: Secondary | ICD-10-CM | POA: Diagnosis not present

## 2022-09-06 DIAGNOSIS — E6609 Other obesity due to excess calories: Secondary | ICD-10-CM | POA: Diagnosis not present

## 2022-09-06 DIAGNOSIS — Z96652 Presence of left artificial knee joint: Secondary | ICD-10-CM

## 2022-09-06 DIAGNOSIS — I5030 Unspecified diastolic (congestive) heart failure: Secondary | ICD-10-CM | POA: Diagnosis not present

## 2022-09-06 DIAGNOSIS — M1711 Unilateral primary osteoarthritis, right knee: Secondary | ICD-10-CM

## 2022-09-06 DIAGNOSIS — I2510 Atherosclerotic heart disease of native coronary artery without angina pectoris: Secondary | ICD-10-CM

## 2022-09-06 DIAGNOSIS — I10 Essential (primary) hypertension: Secondary | ICD-10-CM | POA: Diagnosis not present

## 2022-09-06 DIAGNOSIS — R197 Diarrhea, unspecified: Secondary | ICD-10-CM | POA: Diagnosis not present

## 2022-09-06 NOTE — Progress Notes (Signed)
Chief Complaint  Patient presents with   History of TKA-LEFT    DOS 03/09/2017 LEFT   Encounter Diagnoses  Name Primary?   S/P total knee replacement, left Yes   Arthritis of right knee     Annual follow-up for [name] status post left total knee 5 years ago with [employer] fixed-bearing implant patient with history of liver and kidney dysfunction but recently received good word that his kidney and liver function have returned to normal  Complains of chronic right knee pain  Left knee functioning well, left knee 0-1 05, normal incision  Right knee 0-1 10  No gait assistive devices needed.    X-rays show stable knee and implant  Recommend follow-up in a month after he sees the kidney and liver specialist to see if he is ready for right total knee replacement

## 2022-09-08 ENCOUNTER — Ambulatory Visit: Payer: Federal, State, Local not specified - PPO | Attending: Cardiology | Admitting: Cardiology

## 2022-09-08 ENCOUNTER — Encounter: Payer: Self-pay | Admitting: Cardiology

## 2022-09-08 ENCOUNTER — Other Ambulatory Visit: Payer: Self-pay | Admitting: Student

## 2022-09-08 VITALS — BP 138/70 | HR 84 | Ht 70.5 in | Wt 272.4 lb

## 2022-09-08 DIAGNOSIS — I2510 Atherosclerotic heart disease of native coronary artery without angina pectoris: Secondary | ICD-10-CM

## 2022-09-08 DIAGNOSIS — N1832 Chronic kidney disease, stage 3b: Secondary | ICD-10-CM

## 2022-09-08 DIAGNOSIS — I350 Nonrheumatic aortic (valve) stenosis: Secondary | ICD-10-CM

## 2022-09-08 DIAGNOSIS — I5032 Chronic diastolic (congestive) heart failure: Secondary | ICD-10-CM

## 2022-09-08 DIAGNOSIS — I25118 Atherosclerotic heart disease of native coronary artery with other forms of angina pectoris: Secondary | ICD-10-CM | POA: Diagnosis not present

## 2022-09-08 NOTE — Progress Notes (Unsigned)
[name], [name] [id number]   CLINIC:  Medical Oncology/Hematology  PCP:  [name], [address] [place] [address] [phone]   REASON FOR VISIT:  Follow-up for iron deficiency anemia and thrombocytopenia   CURRENT THERAPY: Intermittent IV iron infusions   INTERVAL HISTORY:  [name] 61 y.o. male returns for routine follow-up of his anemia and thrombocytopenia in the setting of liver cirrhosis.  Last IV iron was 06/10/2022 (Venofer 400 mg).  He was last seen by Tarri Abernethy PA-C on 06/09/2022.  At today's visit, he reports feeling well.  No recent hospitalizations, surgeries, or changes in baseline health status.  He denies any obvious bleeding events such as epistaxis, hematemesis, hematochezia, melena, or hematuria. He reports easy bruising but denies petechial rash.  He denies any B symptoms such as fever, chills, night sweats, unintentional weight loss.  He does note worsening fatigue and sluggishness over the past month.  He denies any pica, restless legs, headaches, chest pain, dyspnea on exertion, lightheadedness, or syncope.  He has 50% energy and 75% appetite. He endorses that he is maintaining a stable weight.   REVIEW OF SYSTEMS:  Review of Systems  Constitutional:  Positive for fatigue. Negative for appetite change, chills, diaphoresis, fever and unexpected weight change.  HENT:   Negative for lump/mass and nosebleeds.   Eyes:  Negative for eye problems.  Respiratory:  Negative for cough, hemoptysis and shortness of breath.   Cardiovascular:  Negative for chest pain, leg swelling and palpitations.  Gastrointestinal:  Negative for abdominal pain, blood in stool, constipation, diarrhea, nausea and vomiting.  Genitourinary:  Negative for hematuria.   Musculoskeletal:  Positive for arthralgias.  Skin: Negative.   Neurological:  Positive for headaches. Negative for dizziness and light-headedness.   Hematological:  Does not bruise/bleed easily.      PAST MEDICAL/SURGICAL HISTORY:  Past Medical History:  Diagnosis Date   Anemia    Arthritis    CAD (coronary artery disease)    Multivessel disease status post CABG 08/2015   Cirrhosis (HCC)    CKD (chronic kidney disease) stage 3, GFR 30-59 ml/min (HCC)    Diastolic CHF (HCC)    Essential hypertension    Hyperlipidemia    Iron deficiency anemia 07/15/2021   OSA on CPAP    Polyclonal gammopathy    Thrombocytopenia (HCC) 2016   Type 2 diabetes mellitus (Iron)    Past Surgical History:  Procedure Laterality Date   APPENDECTOMY     Biceps tendon surgery Right    BIOPSY  11/22/2019   Procedure: BIOPSY;  Surgeon: Daneil Dolin, MD;  Location: [facility];  Service: Endoscopy;;   CARDIAC CATHETERIZATION N/A 08/25/2015   Procedure: Left Heart Cath and Coronary Angiography;  Surgeon: Belva Crome, MD;  Location: [facility];  Service: Cardiovascular;  Laterality: N/A;   CATARACT EXTRACTION Left 08/18/2022   COLONOSCOPY WITH PROPOFOL N/A 11/22/2019   Procedure: COLONOSCOPY WITH PROPOFOL;  Surgeon: Daneil Dolin, MD; Four 4-5 mm polyps, findings suggestive of portal colopathy, congested appearing colonic mucosa diffusely, rectal varices, and adequate right colon prep.  Pathology with tubular adenomas and hyperplastic polyp.  Right colon biopsy with focal active colitis.  Recommendations to repeat colonoscopy in 3 months due to poor prep.   COLONOSCOPY WITH PROPOFOL N/A 03/17/2020   Procedure: COLONOSCOPY WITH PROPOFOL;  Surgeon: Daneil Dolin, MD;  Location: [facility];  Service: Endoscopy;  Laterality: N/A;  8:45am -  pt does not need covid test, was + 2/4 <90 days   CORONARY ARTERY BYPASS GRAFT N/A 08/29/2015   Procedure: CORONARY ARTERY BYPASS GRAFTING (CABG);  Surgeon: [name], MD;  Location: [facility];  Service: Open Heart Surgery;  Laterality: N/A;   ESOPHAGEAL BANDING N/A 07/23/2021   Procedure: ESOPHAGEAL  BANDING;  Surgeon: Eloise Harman, DO;  Location: [facility];  Service: Endoscopy;  Laterality: N/A;   ESOPHAGOGASTRODUODENOSCOPY (EGD) WITH PROPOFOL N/A 11/22/2019   Procedure: ESOPHAGOGASTRODUODENOSCOPY (EGD) WITH PROPOFOL;  Surgeon: Daneil Dolin, MD; 4 columns of grade 2-3 esophageal varices, portal gastropathy, gastric polyp/abnormal gastric mucosa s/p biopsy.  Pathology with hyperplastic polyp, mild chronic gastritis, negative H. pylori.   ESOPHAGOGASTRODUODENOSCOPY (EGD) WITH PROPOFOL N/A 07/23/2021   Grade 2 esophageal varices without stigmata of bleeding, completely eradicated with banding, portal gastropathy, GAVE without bleeding.   KNEE ARTHROSCOPY Left    TEE WITHOUT CARDIOVERSION N/A 08/29/2015   Procedure: TRANSESOPHAGEAL ECHOCARDIOGRAM (TEE);  Surgeon: [name], MD;  Location: [facility];  Service: Open Heart Surgery;  Laterality: N/A;   TOTAL KNEE ARTHROPLASTY Left 03/09/2017   Procedure: TOTAL KNEE ARTHROPLASTY;  Surgeon: Carole Civil, MD;  Location: [facility];  Service: Orthopedics;  Laterality: Left;     SOCIAL HISTORY:  Social History   Socioeconomic History   Marital status: Married    Spouse name: Not on file   Number of children: 0   Years of education: college   Highest education level: Not on file  Occupational History   Occupation: Maintenance tech    Employer: [employer]  Tobacco Use   Smoking status: Never   Smokeless tobacco: Never  Vaping Use   Vaping Use: Never used  Substance and Sexual Activity   Alcohol use: Not Currently   Drug use: No   Sexual activity: Not Currently  Other Topics Concern   Not on file  Social History Narrative   Not on file   Social Determinants of Health   Financial Resource Strain: Not on file  Food Insecurity: Not on file  Transportation Needs: Not on file  Physical Activity: Not on file  Stress: Not on file  Social Connections: Not on file  Intimate Partner Violence: Not on file     FAMILY HISTORY:  Family History  Problem Relation Age of Onset   Arthritis Other    Cancer Other    Diabetes Other    CAD Father    Diabetes Mellitus II Father    Liver cancer Father    Hodgkin's lymphoma Father    CAD Brother    Diabetes Mellitus II Brother    ALS Mother    Diabetes Mellitus II Sister    Diabetes Mellitus II Brother    Diabetes Mellitus II Maternal Grandmother    Aneurysm Maternal Grandmother    Cancer Maternal Grandfather    Anesthesia problems Neg Hx    Hypotension Neg Hx    Malignant hyperthermia Neg Hx    Pseudochol deficiency Neg Hx    Colon cancer Neg Hx     CURRENT MEDICATIONS:  Outpatient Encounter Medications as of 09/09/2022  Medication Sig   ACCU-CHEK GUIDE test strip 3 (three) times daily.   acetaminophen (TYLENOL) 325 MG tablet Take 650 mg by mouth every 6 (six) hours as needed for mild pain.   albuterol (VENTOLIN HFA) 108 (90 Base) MCG/ACT inhaler Inhale 2 puffs into the lungs every 6 (six) hours as needed for wheezing or shortness of breath.  ALPRAZolam (XANAX) 0.25 MG tablet Take by mouth.   Armodafinil 50 MG tablet Take 1 tablet (50 mg total) by mouth as needed.   aspirin EC 81 MG tablet Take 1 tablet (81 mg total) by mouth daily with breakfast. (Patient taking differently: Take 81 mg by mouth every evening.)   BD PEN NEEDLE NANO 2ND GEN 32G X 4 MM MISC    budesonide-formoterol (SYMBICORT) 160-4.5 MCG/ACT inhaler Inhale 2 puffs into the lungs 2 (two) times daily.   Cholecalciferol (DIALYVITE VITAMIN D 5000) 125 MCG (5000 UT) capsule Take 5,000 Units by mouth in the morning.    COMBIVENT RESPIMAT 20-100 MCG/ACT AERS respimat Inhale 1 puff into the lungs 4 (four) times daily.   Continuous Blood Gluc Sensor (FREESTYLE LIBRE 14 DAY SENSOR) MISC SMARTSIG:1 Each Topical Every 2 Weeks   ferrous sulfate 325 (65 FE) MG tablet Take 1 tablet (325 mg total) by mouth daily with breakfast.   insulin aspart (NOVOLOG) 100 UNIT/ML injection Inject 5  Units into the skin 3 (three) times daily before meals. Special Instructions: If accu-check is greater than 150. Hold for accu-check 150 or below. With Meals   insulin degludec (TRESIBA FLEXTOUCH) 200 UNIT/ML FlexTouch Pen Inject 40 Units into the skin in the morning. Give 30 units Subcutaneous  at Bedtime 8:00 pm (Patient taking differently: Inject 30 Units into the skin See admin instructions. Give 30 units Subcutaneous  every evening)   lactulose (CHRONULAC) 10 GM/15ML solution Take 30 mLs (20 g total) by mouth daily.   loratadine (CLARITIN) 10 MG tablet Take 10 mg by mouth in the morning.    magnesium oxide (MAG-OX) 400 (240 Mg) MG tablet Take 1 tablet (400 mg total) by mouth daily.   midodrine (PROAMATINE) 10 MG tablet Take 1 tablet (10 mg total) by mouth 3 (three) times daily with meals.   nadolol (CORGARD) 20 MG tablet Take 1 tablet (20 mg total) by mouth daily.   nitroGLYCERIN (NITROSTAT) 0.4 MG SL tablet Place 1 tablet (0.4 mg total) under the tongue every 5 (five) minutes as needed for chest pain. Do not exceed 3 doses in 15 mins   OZEMPIC, 0.25 OR 0.5 MG/DOSE, 2 MG/1.5ML SOPN Inject into the skin.   pantoprazole (PROTONIX) 40 MG tablet Take 1 tablet (40 mg total) by mouth daily.   potassium chloride SA (KLOR-CON M) 20 MEQ tablet Take 20 mEq by mouth daily.   pravastatin (PRAVACHOL) 20 MG tablet TAKE 1 TABLET(20 MG) BY MOUTH AT BEDTIME   propranolol (INDERAL) 40 MG tablet Take 40 mg by mouth daily.   spironolactone (ALDACTONE) 25 MG tablet Take 0.5 tablets (12.5 mg total) by mouth daily.   torsemide (DEMADEX) 20 MG tablet Take 1 tablet (20 mg total) by mouth daily.   traMADol (ULTRAM) 50 MG tablet TAKE 1 TABLET(50 MG) BY MOUTH EVERY 6 HOURS AS NEEDED   venlafaxine XR (EFFEXOR-XR) 75 MG 24 hr capsule Take 1 capsule (75 mg total) by mouth daily with breakfast.   No facility-administered encounter medications on file as of 09/09/2022.    ALLERGIES:  Allergies  Allergen Reactions    Fluticasone Rash     PHYSICAL EXAM:  ECOG PERFORMANCE STATUS: 1 - Symptomatic but completely ambulatory  There were no vitals filed for this visit. There were no vitals filed for this visit. Physical Exam Constitutional:      Appearance: Normal appearance. He is obese.  HENT:     Head: Normocephalic and atraumatic.     Mouth/Throat:  Mouth: Mucous membranes are moist.  Eyes:     Extraocular Movements: Extraocular movements intact.     Pupils: Pupils are equal, round, and reactive to light.  Cardiovascular:     Rate and Rhythm: Normal rate and regular rhythm.     Pulses: Normal pulses.     Heart sounds: Murmur heard.  Pulmonary:     Effort: Pulmonary effort is normal.     Breath sounds: Normal breath sounds.  Abdominal:     General: Abdomen is protuberant. Bowel sounds are normal.     Palpations: Abdomen is soft.     Tenderness: There is no abdominal tenderness.  Musculoskeletal:        General: No swelling.     Right lower leg: Edema (1+, chronic) present.     Left lower leg: Edema (trace, chronic) present.  Lymphadenopathy:     Cervical: No cervical adenopathy.  Skin:    General: Skin is warm and dry.  Neurological:     General: No focal deficit present.     Mental Status: He is alert and oriented to person, place, and time.  Psychiatric:        Mood and Affect: Mood normal.        Behavior: Behavior normal.      LABORATORY DATA:  I have reviewed the labs as listed.  CBC    Component Value Date/Time   WBC 5.3 09/02/2022 1001   RBC 4.00 (L) 09/02/2022 1001   HGB 13.4 09/02/2022 1001   HGB 12.5 (L) 05/03/2019 1005   HGB 10.1 (L) 12/15/2018 1525   HCT 40.1 09/02/2022 1001   HCT 30.2 (L) 12/15/2018 1525   PLT 113 (L) 09/02/2022 1001   PLT 72 (L) 05/03/2019 1005   PLT 87 (LL) 12/15/2018 1525   MCV 100.3 (H) 09/02/2022 1001   MCV 84 12/15/2018 1525   MCH 33.5 09/02/2022 1001   MCHC 33.4 09/02/2022 1001   RDW 15.6 (H) 09/02/2022 1001   RDW 15.1  12/15/2018 1525   LYMPHSABS 0.8 09/02/2022 1001   LYMPHSABS 1.5 12/15/2018 1525   MONOABS 0.3 09/02/2022 1001   EOSABS 0.1 09/02/2022 1001   EOSABS 0.1 12/15/2018 1525   BASOSABS 0.0 09/02/2022 1001   BASOSABS 0.0 12/15/2018 1525      Latest Ref Rng & Units 09/02/2022   10:01 AM 06/24/2022    5:01 PM 06/07/2022    3:19 PM  CMP  Glucose 70 - 99 mg/dL 297  359  327   BUN 8 - 23 mg/dL 16  21  12    Creatinine 0.61 - 1.24 mg/dL 1.14  1.11  1.03   Sodium 135 - 145 mmol/L 136  133  136   Potassium 3.5 - 5.1 mmol/L 4.7  4.4  3.8   Chloride 98 - 111 mmol/L 105  98  104   CO2 22 - 32 mmol/L 25  27  23    Calcium 8.9 - 10.3 mg/dL 8.9  8.7  8.5   Total Protein 6.5 - 8.1 g/dL 7.5  7.5    Total Bilirubin 0.3 - 1.2 mg/dL 2.1  2.9    Alkaline Phos 38 - 126 U/L 67  86    AST 15 - 41 U/L 34  35    ALT 0 - 44 U/L 14  16      DIAGNOSTIC IMAGING:  I have independently reviewed the relevant imaging and discussed with the patient.  ASSESSMENT & PLAN: 1.   Moderate thrombocytopenia, secondary to cirrhosis/splenomegaly -  Thrombocytopenia since 2016.  Platelets between 55-90 since 2018. - BMBX on 01/26/2019 showed slightly hypercellular marrow for age with trilineage hematopoiesis.  No significant dyspoiesis or increase in blasts.  Plasma cells are slightly increased in number but with polyclonal staining pattern.  Abundant megakaryocytes with scattered small hypolobulated forms.  Chromosome analysis was normal.  Biopsy was done for work-up of increased IgA levels. - Ultrasound abdomen on 01/15/2020 showed nodular contour of the liver consistent with cirrhosis.  Splenomegaly with volume of 1562 mL. -Thrombocytopenia secondary to splenomegaly from cirrhosis - Most recent CBC (09/02/2022): Platelets 113, stable at baseline - He admits to easy bruising but denies petechial rash or major bleeding events   - PLAN: Platelets stable at baseline.  No treatment indicated at this time.  We will continue to monitor.  If  platelets drop to < 30,000 would consider referral for partial splenic embolization.   2.  Normocytic to macrocytic anemia: - Combination anemia from CKD, iron deficiency, and intermittent GI bleeding - Hospitalization from 07/21/2021 through 07/25/2021: Presented to ED with Hgb 6.7 and admitted for acute blood loss anemia.  Patient had EGD on 07/23/2021 with banding of grade 2 esophageal varices.  He was transfused PRBC x1 on 07/22/2021. - Colonoscopy on 03/17/2020 showed inadequate preparation of the colon with rectal varices.  Much of the colon could not be seen. - EGD (06/18/2021) at [facility]: Grade 2 esophageal varices s/p banding; gastric antral vascular ectasia without bleeding, portal hypertensive gastropathy - EGD (07/23/2021): Two columns of grade 2 varices s/p banding; moderate portal hypertensive gastropathy with small amount of fresh blood in this region, but without any active oozing; moderate gastric antral vascular ectasia without bleeding in gastric antrum. - Most recent blood transfusion with PRBC x1 on 07/22/2021 - Most recent IV iron with Venofer on 06/10/2022 - Denies any signs or symptoms of gross rectal hemorrhage or melena, even during his episode of acute blood loss anemia     -Chronic fatigue unchanged - Most recent labs (09/02/2022): Hgb 13.4/MCV 100.3, ferritin 166, iron saturation 39% - Differential diagnosis favors anemia secondary to iron deficiency, suspect chronic GI blood loss in the setting of cirrhosis and esophageal varices; macrocytosis secondary to liver disease; element of mild CKD may also be contributing to his anemia  - PLAN: No indication for IV iron at this time. - Repeat labs and RTC in 3 months followed by office visit - Patient is aware of alarm symptoms that would prompt more immediate medical attention. - Continue follow-up with GI for ongoing management of cirrhosis and esophageal varices.   3.  Positive rheumatoid factor: -Work-up for thrombocytopenia showed  rheumatoid factor positive at 58.6.  ANA was negative. -He does not have any clinical signs or symptoms of rheumatoid arthritis.   4.  Adrenal insufficiency: - He was hospitalized in the first week of April 2022 with adrenal insufficiency. - Was placed on hydrocortisone 20 mg twice daily and midodrine 15 mg 3 times daily - His blood pressure is well maintained at this time.   5.  Liver cirrhosis - He follows with [facility] and is undergoing evaluation to be put on transplant list   All questions were answered. The patient knows to call the clinic with any problems, questions or concerns.  Medical decision making: Moderate  Time spent on visit: I spent 20 minutes counseling the patient face to face. The total time spent in the appointment was 30 minutes and more than 50% was on counseling.   [name]  Hervey Ard, PA-C  09/09/2022 10:13 AM

## 2022-09-08 NOTE — Patient Instructions (Signed)
Medication Instructions:  INCREASE Torsemide to 40 mg daily for 1 week, then call our office to report weight. [phone]   Labwork: None today  Testing/Procedures: None today  Follow-Up: 6 months  Any Other Special Instructions Will Be Listed Below (If Applicable).  If you need a refill on your cardiac medications before your next appointment, please call your pharmacy.

## 2022-09-08 NOTE — Progress Notes (Signed)
Cardiology Office Note  Date: 09/08/2022   ID: Christopher, [name] [birth year], MRN [id number]  PCP:  [name], MD  Cardiologist:  Rozann Lesches, MD Electrophysiologist:  None   Chief Complaint  Patient presents with   Cardiac follow-up    History of Present Illness: [name] is a 61 y.o. male last seen in July by Ms. Walker NP, I reviewed the note.  He is here for a follow-up visit.  States that he is doing well with NYHA class II dyspnea, however his weight looks to be up about 20 pounds since earlier in the year.  He he did take an extra Demadex about a week ago.  He continues to follow with [facility] for management of cirrhosis.  Fortunately, his renal function has improved, most recent creatinine 1.14 and normal potassium.  He is typically on Demadex 20 mg daily and Aldactone 12.5 mg daily, also potassium supplement.  Additional medical therapy includes Ozempic with concurrent type 2 diabetes mellitus.  He is on midodrine with history of orthostatic hypotension.  He did have a follow-up echocardiogram in June which revealed LVEF 60 to 65% with mild diastolic dysfunction, normal RV contraction and estimated PASP, also moderate aortic stenosis with mean gradient 25 mmHg.  We discussed this today.  Past Medical History:  Diagnosis Date   Anemia    Arthritis    CAD (coronary artery disease)    Multivessel disease status post CABG 08/2015   Cirrhosis ([place])    CKD (chronic kidney disease) stage 3, GFR 30-59 ml/min (HCC)    Diastolic CHF (HCC)    Essential hypertension    Hyperlipidemia    Iron deficiency anemia 07/15/2021   OSA on CPAP    Polyclonal gammopathy    Thrombocytopenia (HCC) 2016   Type 2 diabetes mellitus ([place])     Past Surgical History:  Procedure Laterality Date   APPENDECTOMY     Biceps tendon surgery Right    BIOPSY  11/22/2019   Procedure: BIOPSY;  Surgeon: Daneil Dolin, MD;  [facility];  Service: Endoscopy;;   CARDIAC  CATHETERIZATION N/A 08/25/2015   Procedure: Left Heart Cath and Coronary Angiography;  Surgeon: Belva Crome, MD;  Location: [facility];  Service: Cardiovascular;  Laterality: N/A;   CATARACT EXTRACTION Left 08/18/2022   COLONOSCOPY WITH PROPOFOL N/A 11/22/2019   Procedure: COLONOSCOPY WITH PROPOFOL;  Surgeon: Daneil Dolin, MD; Four 4-5 mm polyps, findings suggestive of portal colopathy, congested appearing colonic mucosa diffusely, rectal varices, and adequate right colon prep.  Pathology with tubular adenomas and hyperplastic polyp.  Right colon biopsy with focal active colitis.  Recommendations to repeat colonoscopy in 3 months due to poor prep.   COLONOSCOPY WITH PROPOFOL N/A 03/17/2020   Procedure: COLONOSCOPY WITH PROPOFOL;  Surgeon: Daneil Dolin, MD;  [facility];  Service: Endoscopy;  Laterality: N/A;  8:45am - pt does not need covid test, was + 2/4 <90 days   CORONARY ARTERY BYPASS GRAFT N/A 08/29/2015   Procedure: CORONARY ARTERY BYPASS GRAFTING (CABG);  Surgeon: [name], MD;  Location: [place];  Service: Open Heart Surgery;  Laterality: N/A;   ESOPHAGEAL BANDING N/A 07/23/2021   Procedure: ESOPHAGEAL BANDING;  Surgeon: Eloise Harman, DO;  [facility];  Service: Endoscopy;  Laterality: N/A;   ESOPHAGOGASTRODUODENOSCOPY (EGD) WITH PROPOFOL N/A 11/22/2019   Procedure: ESOPHAGOGASTRODUODENOSCOPY (EGD) WITH PROPOFOL;  Surgeon: Daneil Dolin, MD; 4 columns of grade 2-3 esophageal  varices, portal gastropathy, gastric polyp/abnormal gastric mucosa s/p biopsy.  Pathology with hyperplastic polyp, mild chronic gastritis, negative H. pylori.   ESOPHAGOGASTRODUODENOSCOPY (EGD) WITH PROPOFOL N/A 07/23/2021   Grade 2 esophageal varices without stigmata of bleeding, completely eradicated with banding, portal gastropathy, GAVE without bleeding.   KNEE ARTHROSCOPY Left    TEE WITHOUT CARDIOVERSION N/A 08/29/2015   Procedure: TRANSESOPHAGEAL  ECHOCARDIOGRAM (TEE);  Surgeon: [name], MD;  Location: [place];  Service: Open Heart Surgery;  Laterality: N/A;   TOTAL KNEE ARTHROPLASTY Left 03/09/2017   Procedure: TOTAL KNEE ARTHROPLASTY;  Surgeon: Carole Civil, MD;  Location: [facility];  Service: Orthopedics;  Laterality: Left;    Current Outpatient Medications  Medication Sig Dispense Refill   ACCU-CHEK GUIDE test strip 3 (three) times daily.     acetaminophen (TYLENOL) 325 MG tablet Take 650 mg by mouth every 6 (six) hours as needed for mild pain.     albuterol (VENTOLIN HFA) 108 (90 Base) MCG/ACT inhaler Inhale 2 puffs into the lungs every 6 (six) hours as needed for wheezing or shortness of breath. 18 g 0   ALPRAZolam (XANAX) 0.25 MG tablet Take by mouth.     Armodafinil 50 MG tablet Take 1 tablet (50 mg total) by mouth as needed. 30 tablet 5   aspirin EC 81 MG tablet Take 1 tablet (81 mg total) by mouth daily with breakfast. (Patient taking differently: Take 81 mg by mouth every evening.) 30 tablet 11   BD PEN NEEDLE NANO 2ND GEN 32G X 4 MM MISC      budesonide-formoterol (SYMBICORT) 160-4.5 MCG/ACT inhaler Inhale 2 puffs into the lungs 2 (two) times daily.     Cholecalciferol (DIALYVITE VITAMIN D 5000) 125 MCG (5000 UT) capsule Take 5,000 Units by mouth in the morning.      COMBIVENT RESPIMAT 20-100 MCG/ACT AERS respimat Inhale 1 puff into the lungs 4 (four) times daily.     Continuous Blood Gluc Sensor (FREESTYLE LIBRE 14 DAY SENSOR) MISC SMARTSIG:1 Each Topical Every 2 Weeks     ferrous sulfate 325 (65 FE) MG tablet Take 1 tablet (325 mg total) by mouth daily with breakfast. 30 tablet 3   insulin aspart (NOVOLOG) 100 UNIT/ML injection Inject 5 Units into the skin 3 (three) times daily before meals. Special Instructions: If accu-check is greater than 150. Hold for accu-check 150 or below. With Meals 10 mL 0   insulin degludec (TRESIBA FLEXTOUCH) 200 UNIT/ML FlexTouch Pen Inject 40 Units into the skin in the morning.  Give 30 units Subcutaneous  at Bedtime 8:00 pm (Patient taking differently: Inject 30 Units into the skin See admin instructions. Give 30 units Subcutaneous  every evening) 15 mL 0   lactulose (CHRONULAC) 10 GM/15ML solution Take 30 mLs (20 g total) by mouth daily. 900 mL 0   loratadine (CLARITIN) 10 MG tablet Take 10 mg by mouth in the morning.      magnesium oxide (MAG-OX) 400 (240 Mg) MG tablet Take 1 tablet (400 mg total) by mouth daily. 30 tablet 2   midodrine (PROAMATINE) 10 MG tablet Take 1 tablet (10 mg total) by mouth 3 (three) times daily with meals. 90 tablet 5   nadolol (CORGARD) 20 MG tablet Take 1 tablet (20 mg total) by mouth daily. 90 tablet 3   nitroGLYCERIN (NITROSTAT) 0.4 MG SL tablet Place 1 tablet (0.4 mg total) under the tongue every 5 (five) minutes as needed for chest pain. Do not exceed 3 doses in 15 mins  25 tablet 3   OZEMPIC, 0.25 OR 0.5 MG/DOSE, 2 MG/1.5ML SOPN Inject into the skin.     pantoprazole (PROTONIX) 40 MG tablet Take 1 tablet (40 mg total) by mouth daily. 30 tablet 5   potassium chloride SA (KLOR-CON M) 20 MEQ tablet Take 20 mEq by mouth daily.     pravastatin (PRAVACHOL) 20 MG tablet TAKE 1 TABLET(20 MG) BY MOUTH AT BEDTIME 90 tablet 1   propranolol (INDERAL) 40 MG tablet Take 40 mg by mouth daily.     spironolactone (ALDACTONE) 25 MG tablet Take 0.5 tablets (12.5 mg total) by mouth daily. 30 tablet 3   torsemide (DEMADEX) 20 MG tablet Take 1 tablet (20 mg total) by mouth daily. 30 tablet 1   traMADol (ULTRAM) 50 MG tablet TAKE 1 TABLET(50 MG) BY MOUTH EVERY 6 HOURS AS NEEDED 60 tablet 0   venlafaxine XR (EFFEXOR-XR) 75 MG 24 hr capsule Take 1 capsule (75 mg total) by mouth daily with breakfast. 30 capsule 3   No current facility-administered medications for this visit.   Allergies:  Fluticasone   ROS: Palpitations or syncope.  Physical Exam: VS:  BP 138/70   Pulse 84   Ht 5' 10.5" (1.791 m)   Wt 272 lb 6.4 oz (123.6 kg)   SpO2 97%   BMI 38.53  kg/m , BMI Body mass index is 38.53 kg/m.  Wt Readings from Last 3 Encounters:  09/08/22 272 lb 6.4 oz (123.6 kg)  09/06/22 269 lb (122 kg)  07/05/22 249 lb (112.9 kg)    General: Patient appears comfortable at rest. HEENT: Conjunctiva and lids normal. Neck: Supple, no elevated JVP or carotid bruits, no thyromegaly. Lungs: Clear to auscultation, nonlabored breathing at rest. Cardiac: Regular rate and rhythm, no S3, 1-6/1 systolic murmur, no pericardial rub. Abdomen: Soft, bowel sounds present. Extremities: Compression stockings in place.  ECG:  An ECG dated 06/24/2022 was personally reviewed today and demonstrated:  Sinus rhythm with low voltage, decreased R wave progression.  Recent Labwork: 06/24/2022: B Natriuretic Peptide 74.0 09/02/2022: ALT 14; AST 34; BUN 16; Creatinine, Ser 1.14; Hemoglobin 13.4; Platelets 113; Potassium 4.7; Sodium 136     Component Value Date/Time   CHOL 120 05/25/2019 1001   TRIG 136 05/25/2019 1001   HDL 38 (L) 05/25/2019 1001   CHOLHDL 3.2 05/25/2019 1001   VLDL 27 05/25/2019 1001   LDLCALC 55 05/25/2019 1001    Other Studies Reviewed Today:  Echocardiogram 06/08/2022:  1. Left ventricular ejection fraction, by estimation, is 60 to 65%. The  left ventricle has normal function. Left ventricular endocardial border  not optimally defined to evaluate regional wall motion. There is mild left  ventricular hypertrophy. Left  ventricular diastolic parameters are consistent with Grade I diastolic  dysfunction (impaired relaxation).   2. Right ventricular systolic function is normal. The right ventricular  size is normal. There is normal pulmonary artery systolic pressure.   3. The mitral valve is abnormal. Trivial mitral valve regurgitation. No  evidence of mitral stenosis.   4. The tricuspid valve is abnormal.   5. The aortic valve has an indeterminant number of cusps. There is severe  calcifcation of the aortic valve. There is severe thickening of the  aortic  valve. Aortic valve regurgitation is not visualized. Moderate aortic valve  stenosis. Aortic valve mean  gradient measures 25.0 mmHg. Aortic valve peak gradient measures 39.4  mmHg. Aortic valve area, by VTI measures 1.08 cm.   6. The inferior vena cava is normal  in size with greater than 50%  respiratory variability, suggesting right atrial pressure of 3 mmHg.   Assessment and Plan:  1.  HFpEF, fluid status also complicated by cirrhosis with portal hypertension, moderate aortic stenosis, and fluctuating renal insufficiency.  His weight is up around 20 pounds, we will double his Demadex for the next week and have him report back, also check into telehealth monitoring as well.  He is on Ozempic for concurrent treatment of type 2 diabetes mellitus, also Aldactone.  Recent creatinine down to 1.14 with normal potassium.  2.  CKD stage IIIb, most recent creatinine down to 1.14.  He does follow with nephrology.  3.  Cirrhosis with portal hypertension, continues to follow with [facility].  Reportedly no longer on transplant list due to relative improvement.  He is on nadolol.  4.  Multivessel CAD status post CABG in 2016.  No active angina symptoms.  Continue aspirin and Pravachol.  5.  Orthostatic hypotension, stable on midodrine.  6.  Aortic stenosis, moderate by echocardiogram in June.  Mean gradient 25 mmHg at that time.  He will need a follow-up echocardiogram around June 2024.  Medication Adjustments/Labs and Tests Ordered: Current medicines are reviewed at length with the patient today.  Concerns regarding medicines are outlined above.   Tests Ordered: No orders of the defined types were placed in this encounter.   Medication Changes: No orders of the defined types were placed in this encounter.   Disposition:  Follow up  6 months.  Signed, Satira Sark, MD, [facility] 09/08/2022 9:25 AM    [facility] at [facility] [address] [place], [place]  [address] Phone: [phone]; Fax: [phone]

## 2022-09-09 ENCOUNTER — Inpatient Hospital Stay (HOSPITAL_BASED_OUTPATIENT_CLINIC_OR_DEPARTMENT_OTHER): Payer: Federal, State, Local not specified - PPO | Admitting: Physician Assistant

## 2022-09-09 VITALS — BP 109/58 | HR 76 | Temp 98.1°F | Resp 18 | Wt 270.9 lb

## 2022-09-09 DIAGNOSIS — E1122 Type 2 diabetes mellitus with diabetic chronic kidney disease: Secondary | ICD-10-CM | POA: Diagnosis not present

## 2022-09-09 DIAGNOSIS — I130 Hypertensive heart and chronic kidney disease with heart failure and stage 1 through stage 4 chronic kidney disease, or unspecified chronic kidney disease: Secondary | ICD-10-CM | POA: Diagnosis not present

## 2022-09-09 DIAGNOSIS — D631 Anemia in chronic kidney disease: Secondary | ICD-10-CM | POA: Diagnosis not present

## 2022-09-09 DIAGNOSIS — K7460 Unspecified cirrhosis of liver: Secondary | ICD-10-CM | POA: Diagnosis not present

## 2022-09-09 DIAGNOSIS — D6959 Other secondary thrombocytopenia: Secondary | ICD-10-CM | POA: Diagnosis not present

## 2022-09-09 DIAGNOSIS — D696 Thrombocytopenia, unspecified: Secondary | ICD-10-CM | POA: Diagnosis not present

## 2022-09-09 DIAGNOSIS — D509 Iron deficiency anemia, unspecified: Secondary | ICD-10-CM | POA: Diagnosis not present

## 2022-09-09 DIAGNOSIS — Z808 Family history of malignant neoplasm of other organs or systems: Secondary | ICD-10-CM | POA: Diagnosis not present

## 2022-09-09 DIAGNOSIS — Z807 Family history of other malignant neoplasms of lymphoid, hematopoietic and related tissues: Secondary | ICD-10-CM | POA: Diagnosis not present

## 2022-09-09 DIAGNOSIS — I5030 Unspecified diastolic (congestive) heart failure: Secondary | ICD-10-CM | POA: Diagnosis not present

## 2022-09-09 DIAGNOSIS — N183 Chronic kidney disease, stage 3 unspecified: Secondary | ICD-10-CM | POA: Diagnosis not present

## 2022-09-09 DIAGNOSIS — E2740 Unspecified adrenocortical insufficiency: Secondary | ICD-10-CM | POA: Diagnosis not present

## 2022-09-09 NOTE — Patient Instructions (Signed)
[place] at [facility] Discharge Instructions  You were seen today by Tarri Abernethy PA-C for the following conditions.  LOW PLATELETS: Your platelets remain mildly low, but at their usual baseline due to your underlying cirrhosis and enlarged liver.  You do not need any treatment for your low platelets at this time, but you should be aware that you are at increased risk of bleeding events.  IRON DEFICIENCY ANEMIA: Your blood and iron levels look great.  You do not need any IV iron at this time!  **You are always at a risk of recurrent anemia due to your underlying risk factors for GI bleeding.  Please seek immediate medical attention if you notice any bright red blood or dark black bowel movements in the toilet.  You should also let us know if you begin to experience symptoms of anemia such as worsening fatigue, shortness of breath with exertion, chest pain, or feeling like you may pass out.  LABS: Return in 3 months for repeat labs  FOLLOW-UP APPOINTMENT: Office visit in 3 months, after labs  ** Thank you for trusting me with your healthcare!  I strive to provide all of my patients with quality care at each visit.  If you receive a survey for this visit, I would be so grateful to you for taking the time to provide feedback.  Thank you in advance!  ~ Ivanna Kocak                   Dr. Derek Jack   &   Tarri Abernethy, PA-C   - - - - - - - - - - - - - - - - - -     Thank you for choosing [place] at [facility] to provide your oncology and hematology care.  To afford each patient quality time with our provider, please arrive at least 15 minutes before your scheduled appointment time.   If you have a lab appointment with the [place] please come in thru the Main Entrance and check in at the main information desk.  You need to re-schedule your appointment should you arrive 10 or more minutes late.  We strive to give you  quality time with our providers, and arriving late affects you and other patients whose appointments are after yours.  Also, if you no show three or more times for appointments you may be dismissed from the clinic at the providers discretion.     Again, thank you for choosing [facility].  Our hope is that these requests will decrease the amount of time that you wait before being seen by our physicians.       _____________________________________________________________  Should you have questions after your visit to [facility], please contact our office at [phone] and follow the prompts.  Our office hours are 8:00 a.m. and 4:30 p.m. Monday - Friday.  Please note that voicemails left after 4:00 p.m. may not be returned until the following business day.  We are closed weekends and major holidays.  You do have access to a nurse 24-7, just call the main number to the clinic [phone] and do not press any options, hold on the line and a nurse will answer the phone.    For prescription refill requests, have your pharmacy contact our office and allow 72 hours.    Due to Covid, you will need to wear a mask upon entering the hospital. If  you do not have a mask, a mask will be given to you at the Main Entrance upon arrival. For doctor visits, patients may have 1 support person age 72 or older with them. For treatment visits, patients can not have anyone with them due to social distancing guidelines and our immunocompromised population.

## 2022-09-10 DIAGNOSIS — R197 Diarrhea, unspecified: Secondary | ICD-10-CM | POA: Diagnosis not present

## 2022-09-10 DIAGNOSIS — E1140 Type 2 diabetes mellitus with diabetic neuropathy, unspecified: Secondary | ICD-10-CM | POA: Diagnosis not present

## 2022-09-12 ENCOUNTER — Other Ambulatory Visit: Payer: Self-pay

## 2022-09-12 ENCOUNTER — Telehealth: Payer: Self-pay | Admitting: Physician Assistant

## 2022-09-12 ENCOUNTER — Emergency Department (HOSPITAL_COMMUNITY): Payer: Federal, State, Local not specified - PPO

## 2022-09-12 ENCOUNTER — Encounter (HOSPITAL_COMMUNITY): Payer: Self-pay | Admitting: *Deleted

## 2022-09-12 ENCOUNTER — Emergency Department (HOSPITAL_COMMUNITY)
Admission: EM | Admit: 2022-09-12 | Discharge: 2022-09-12 | Disposition: A | Discharge status: Home / Self Care | Payer: Federal, State, Local not specified - PPO | Attending: Emergency Medicine | Admitting: Emergency Medicine

## 2022-09-12 DIAGNOSIS — K7460 Unspecified cirrhosis of liver: Secondary | ICD-10-CM | POA: Diagnosis not present

## 2022-09-12 DIAGNOSIS — Z7982 Long term (current) use of aspirin: Secondary | ICD-10-CM | POA: Insufficient documentation

## 2022-09-12 DIAGNOSIS — L03115 Cellulitis of right lower limb: Secondary | ICD-10-CM

## 2022-09-12 DIAGNOSIS — I110 Hypertensive heart disease with heart failure: Secondary | ICD-10-CM | POA: Diagnosis not present

## 2022-09-12 DIAGNOSIS — R770 Abnormality of albumin: Secondary | ICD-10-CM | POA: Insufficient documentation

## 2022-09-12 DIAGNOSIS — Z79899 Other long term (current) drug therapy: Secondary | ICD-10-CM | POA: Diagnosis not present

## 2022-09-12 DIAGNOSIS — R600 Localized edema: Secondary | ICD-10-CM | POA: Diagnosis not present

## 2022-09-12 DIAGNOSIS — I509 Heart failure, unspecified: Secondary | ICD-10-CM

## 2022-09-12 DIAGNOSIS — E8809 Other disorders of plasma-protein metabolism, not elsewhere classified: Secondary | ICD-10-CM

## 2022-09-12 DIAGNOSIS — Z20822 Contact with and (suspected) exposure to covid-19: Secondary | ICD-10-CM | POA: Insufficient documentation

## 2022-09-12 DIAGNOSIS — Z794 Long term (current) use of insulin: Secondary | ICD-10-CM | POA: Diagnosis not present

## 2022-09-12 DIAGNOSIS — R0602 Shortness of breath: Secondary | ICD-10-CM | POA: Diagnosis not present

## 2022-09-12 DIAGNOSIS — D696 Thrombocytopenia, unspecified: Secondary | ICD-10-CM | POA: Diagnosis not present

## 2022-09-12 LAB — COMPREHENSIVE METABOLIC PANEL
ALT: 14 U/L (ref 0–44)
AST: 40 U/L (ref 15–41)
Albumin: 3.3 g/dL — ABNORMAL LOW (ref 3.5–5.0)
Alkaline Phosphatase: 69 U/L (ref 38–126)
Anion gap: 8 (ref 5–15)
BUN: 14 mg/dL (ref 8–23)
CO2: 24 mmol/L (ref 22–32)
Calcium: 8.7 mg/dL — ABNORMAL LOW (ref 8.9–10.3)
Chloride: 104 mmol/L (ref 98–111)
Creatinine, Ser: 1.14 mg/dL (ref 0.61–1.24)
GFR, Estimated: >60 mL/min (ref >60)
Glucose, Bld: 252 mg/dL — ABNORMAL HIGH (ref 70–99)
Potassium: 4.4 mmol/L (ref 3.5–5.1)
Sodium: 136 mmol/L (ref 135–145)
Total Bilirubin: 1.8 mg/dL — ABNORMAL HIGH (ref 0.3–1.2)
Total Protein: 7.5 g/dL (ref 6.5–8.1)

## 2022-09-12 LAB — CBC WITH DIFFERENTIAL/PLATELET
Abs Immature Granulocytes: 0 10*3/uL (ref 0.00–0.07)
Basophils Absolute: 0 10*3/uL (ref 0.0–0.1)
Basophils Relative: 1 %
Eosinophils Absolute: 0.1 10*3/uL (ref 0.0–0.5)
Eosinophils Relative: 4 %
HCT: 35.6 % — ABNORMAL LOW (ref 39.0–52.0)
Hemoglobin: 12 g/dL — ABNORMAL LOW (ref 13.0–17.0)
Immature Granulocytes: 0 %
Lymphocytes Relative: 15 %
Lymphs Abs: 0.6 10*3/uL — ABNORMAL LOW (ref 0.7–4.0)
MCH: 33.4 pg (ref 26.0–34.0)
MCHC: 33.7 g/dL (ref 30.0–36.0)
MCV: 99.2 fL (ref 80.0–100.0)
Monocytes Absolute: 0.5 10*3/uL (ref 0.1–1.0)
Monocytes Relative: 11 %
Neutro Abs: 2.7 10*3/uL (ref 1.7–7.7)
Neutrophils Relative %: 69 %
Platelets: 106 10*3/uL — ABNORMAL LOW (ref 150–400)
RBC: 3.59 MIL/uL — ABNORMAL LOW (ref 4.22–5.81)
RDW: 15 % (ref 11.5–15.5)
WBC: 3.9 10*3/uL — ABNORMAL LOW (ref 4.0–10.5)
nRBC: 0 % (ref 0.0–0.2)

## 2022-09-12 LAB — BRAIN NATRIURETIC PEPTIDE: B Natriuretic Peptide: 136 pg/mL — ABNORMAL HIGH (ref 0.0–100.0)

## 2022-09-12 LAB — SARS CORONAVIRUS 2 BY RT PCR: SARS Coronavirus 2 by RT PCR: NEGATIVE

## 2022-09-12 LAB — TROPONIN I (HIGH SENSITIVITY): Troponin I (High Sensitivity): 3 ng/L (ref <18)

## 2022-09-12 MED ORDER — CEFAZOLIN SODIUM-DEXTROSE 1-4 GM/50ML-% IV SOLN
1.0000 g | Freq: Once | INTRAVENOUS | Status: AC
  Administered 2022-09-12: 1 g via INTRAVENOUS
  Filled 2022-09-12 (×2): qty 50

## 2022-09-12 MED ORDER — CEPHALEXIN 500 MG PO CAPS: 500.0000 mg | ORAL_CAPSULE | Freq: Four times a day (QID) | ORAL | 0 refills | Status: DC

## 2022-09-12 NOTE — ED Triage Notes (Signed)
Here with spouse from home for sob and fluid overload, edema in legs and abd. Lasix recently doubled Wednesday, and doesn't seem to be working.

## 2022-09-12 NOTE — Telephone Encounter (Signed)
   The patient called the answering service after-hours today. Recently seen in office 09/08/22 with volume overload, weight up 20lb. Torsemide was doubled with plan for close report back of symptoms. Unfortunately he's continued to retain fluid and states the swelling has persisted and is up to his abdomen. He has a complex PMH to include cirrhosis, AS, and orthostatic hypotension as well so we have met the endpoint of being able to safely adjust meds as OP. Recommended proceeding to ER for evaluation, likely hospitalization for diuresis. He will have family member drive him. He plans to go to [facility]. Will send as FYI to Dr. Domenic Polite.  Charlie Pitter, PA-C

## 2022-09-12 NOTE — ED Provider Notes (Signed)
[facility] EMERGENCY DEPARTMENT Provider Note   CSN: [id number] Arrival date & time: 09/12/22  1613     History  Chief Complaint  Patient presents with   Shortness of Breath    [name] is a 61 y.o. male.  Pt with hx chf, c/o increased bilateral leg swelling and sob/doe in the past week. Symptoms acute onset, moderate, persistent. States recently saw cardiology for same and was told to double lasix dose for the past 3-4 days. Since then, no improvement in symptoms. Is urinating normal amount, but not more than normal. No dysuria. No abd pain or distension. Occasional non prod cough. No sore throat. No fever or chills. No chest pain. No focal or unilateral leg pain or swelling. Indicates compliant w home meds. ?mild orthopnea.    The history is provided by the patient, the spouse and medical records.  Shortness of Breath Associated symptoms: no abdominal pain, no chest pain, no fever, no headaches, no neck pain, no rash, no sore throat and no vomiting        Home Medications Prior to Admission medications   Medication Sig Start Date End Date Taking? Authorizing Provider  ACCU-CHEK GUIDE test strip 3 (three) times daily. 02/12/22   [provider]  acetaminophen (TYLENOL) 325 MG tablet Take 650 mg by mouth every 6 (six) hours as needed for mild pain. 12/19/20   [provider]  albuterol (VENTOLIN HFA) 108 (90 Base) MCG/ACT inhaler Inhale 2 puffs into the lungs every 6 (six) hours as needed for wheezing or shortness of breath. 12/22/20   Gerlene Fee, NP  ALPRAZolam Duanne Moron) 0.25 MG tablet Take by mouth. 02/20/21   [provider]  Armodafinil 50 MG tablet Take 1 tablet (50 mg total) by mouth as needed. 06/03/22   Dohmeier, Asencion Partridge, MD  aspirin EC 81 MG tablet Take 1 tablet (81 mg total) by mouth daily with breakfast. Patient taking differently: Take 81 mg by mouth every evening. 08/25/21   Roxan Hockey, MD  BD PEN NEEDLE NANO 2ND GEN 32G X 4 MM  MISC  04/24/22   [provider]  budesonide-formoterol (SYMBICORT) 160-4.5 MCG/ACT inhaler Inhale 2 puffs into the lungs 2 (two) times daily. 02/12/22   [provider]  Cholecalciferol (DIALYVITE VITAMIN D 5000) 125 MCG (5000 UT) capsule Take 5,000 Units by mouth in the morning.     [provider]  COMBIVENT RESPIMAT 20-100 MCG/ACT AERS respimat Inhale 1 puff into the lungs 4 (four) times daily. 03/25/22   [provider]  Continuous Blood Gluc Sensor (FREESTYLE LIBRE 14 DAY SENSOR) MISC SMARTSIG:1 Each Topical Every 2 Weeks 10/03/21   [provider]  ferrous sulfate 325 (65 FE) MG tablet Take 1 tablet (325 mg total) by mouth daily with breakfast. 08/25/21   Emokpae, Courage, MD  insulin aspart (NOVOLOG) 100 UNIT/ML injection Inject 5 Units into the skin 3 (three) times daily before meals. Special Instructions: If accu-check is greater than 150. Hold for accu-check 150 or below. With Meals 12/22/20   Gerlene Fee, NP  insulin degludec (TRESIBA FLEXTOUCH) 200 UNIT/ML FlexTouch Pen Inject 40 Units into the skin in the morning. Give 30 units Subcutaneous  at Bedtime 8:00 pm Patient taking differently: Inject 30 Units into the skin See admin instructions. Give 30 units Subcutaneous  every evening 12/22/20   Gerlene Fee, NP  lactulose (CHRONULAC) 10 GM/15ML solution Take 30 mLs (20 g total) by mouth daily. 12/22/20   Gerlene Fee,  NP  loratadine (CLARITIN) 10 MG tablet Take 10 mg by mouth in the morning.     [provider]  magnesium oxide (MAG-OX) 400 (240 Mg) MG tablet Take 1 tablet (400 mg total) by mouth daily. 08/26/21   Roxan Hockey, MD  midodrine (PROAMATINE) 10 MG tablet Take 1 tablet (10 mg total) by mouth 3 (three) times daily with meals. 08/25/21   Roxan Hockey, MD  nadolol (CORGARD) 20 MG tablet Take 1 tablet (20 mg total) by mouth daily. 06/07/22   Imogene Burn, PA-C  nitroGLYCERIN (NITROSTAT) 0.4 MG SL tablet Place 1  tablet (0.4 mg total) under the tongue every 5 (five) minutes as needed for chest pain. Do not exceed 3 doses in 15 mins 11/04/21 09/08/22  Hilty, Nadean Corwin, MD  OZEMPIC, 0.25 OR 0.5 MG/DOSE, 2 MG/1.5ML SOPN Inject into the skin. 10/20/21   [provider]  pantoprazole (PROTONIX) 40 MG tablet Take 1 tablet (40 mg total) by mouth daily. 08/25/21 09/08/22  Roxan Hockey, MD  potassium chloride SA (KLOR-CON M) 20 MEQ tablet Take 20 mEq by mouth daily. 03/22/22   [provider]  pravastatin (PRAVACHOL) 20 MG tablet TAKE 1 TABLET(20 MG) BY MOUTH AT BEDTIME 09/08/22   Strader, [place] M, PA-C  propranolol (INDERAL) 40 MG tablet Take 40 mg by mouth daily. 05/31/22   [provider]  spironolactone (ALDACTONE) 25 MG tablet Take 0.5 tablets (12.5 mg total) by mouth daily. 08/26/21   Roxan Hockey, MD  torsemide (DEMADEX) 20 MG tablet Take 1 tablet (20 mg total) by mouth daily. 08/26/21   Roxan Hockey, MD  traMADol (ULTRAM) 50 MG tablet TAKE 1 TABLET(50 MG) BY MOUTH EVERY 6 HOURS AS NEEDED 08/23/22   Carole Civil, MD  venlafaxine XR (EFFEXOR-XR) 75 MG 24 hr capsule Take 1 capsule (75 mg total) by mouth daily with breakfast. 08/25/21   Roxan Hockey, MD      Allergies    Fluticasone    Review of Systems   Review of Systems  Constitutional:  Negative for chills and fever.  HENT:  Negative for sore throat.   Eyes:  Negative for redness.  Respiratory:  Positive for shortness of breath.   Cardiovascular:  Positive for leg swelling. Negative for chest pain and palpitations.  Gastrointestinal:  Negative for abdominal pain, diarrhea and vomiting.  Genitourinary:  Negative for dysuria and flank pain.  Musculoskeletal:  Negative for back pain and neck pain.  Skin:  Negative for rash.  Neurological:  Negative for headaches.  Hematological:  Does not bruise/bleed easily.  Psychiatric/Behavioral:  Negative for confusion.     Physical Exam Updated Vital Signs BP 125/72    Pulse 71   Temp 98 F (36.7 C) (Oral)   Resp 20   Ht 1.778 m (5' 10" )   Wt 122.5 kg   SpO2 100%   BMI 38.74 kg/m  Physical Exam Vitals and nursing note reviewed.  Constitutional:      Appearance: Normal appearance. He is well-developed.  HENT:     Head: Atraumatic.     Nose: Nose normal.     Mouth/Throat:     Mouth: Mucous membranes are moist.  Eyes:     General: No scleral icterus.    Conjunctiva/sclera: Conjunctivae normal.  Neck:     Trachea: No tracheal deviation.  Cardiovascular:     Rate and Rhythm: Normal rate and regular rhythm.     Pulses: Normal pulses.     Heart sounds: Normal heart  sounds. No murmur heard.    No friction rub. No gallop.  Pulmonary:     Effort: Pulmonary effort is normal. No accessory muscle usage or respiratory distress.     Breath sounds: Normal breath sounds.  Abdominal:     General: Bowel sounds are normal. There is no distension.     Palpations: Abdomen is soft.     Tenderness: There is no abdominal tenderness. There is no guarding.  Genitourinary:    Comments: No cva tenderness. Musculoskeletal:     Cervical back: Normal range of motion and neck supple. No rigidity.     Right lower leg: Edema present.     Left lower leg: Edema present.     Comments: Bilateral lower leg swelling. Skin changes lower legs c/w chronic venous stasis - on the right  there is increased erythema and warmth compared to left, which pt/spouse indicate is new, there is some superficially cracked skin to area.  No crepitus. Distal pulses palp bil.   Skin:    General: Skin is warm and dry.     Findings: No rash.  Neurological:     Mental Status: He is alert.     Comments: Alert, speech clear.   Psychiatric:        Mood and Affect: Mood normal.     ED Results / Procedures / Treatments   Labs (all labs ordered are listed, but only abnormal results are displayed) Results for orders placed or performed during the hospital encounter of 09/12/22  SARS  Coronavirus 2 by RT PCR (hospital order, performed in [facility] hospital lab) *cepheid single result test* Anterior Nasal Swab   Specimen: Anterior Nasal Swab  Result Value Ref Range   SARS Coronavirus 2 by RT PCR NEGATIVE NEGATIVE  CBC with Differential  Result Value Ref Range   WBC 3.9 (L) 4.0 - 10.5 K/uL   RBC 3.59 (L) 4.22 - 5.81 MIL/uL   Hemoglobin 12.0 (L) 13.0 - 17.0 g/dL   HCT 35.6 (L) 39.0 - 52.0 %   MCV 99.2 80.0 - 100.0 fL   MCH 33.4 26.0 - 34.0 pg   MCHC 33.7 30.0 - 36.0 g/dL   RDW 15.0 11.5 - 15.5 %   Platelets 106 (L) 150 - 400 K/uL   nRBC 0.0 0.0 - 0.2 %   Neutrophils Relative % 69 %   Neutro Abs 2.7 1.7 - 7.7 K/uL   Lymphocytes Relative 15 %   Lymphs Abs 0.6 (L) 0.7 - 4.0 K/uL   Monocytes Relative 11 %   Monocytes Absolute 0.5 0.1 - 1.0 K/uL   Eosinophils Relative 4 %   Eosinophils Absolute 0.1 0.0 - 0.5 K/uL   Basophils Relative 1 %   Basophils Absolute 0.0 0.0 - 0.1 K/uL   Immature Granulocytes 0 %   Abs Immature Granulocytes 0.00 0.00 - 0.07 K/uL  Comprehensive metabolic panel  Result Value Ref Range   Sodium 136 135 - 145 mmol/L   Potassium 4.4 3.5 - 5.1 mmol/L   Chloride 104 98 - 111 mmol/L   CO2 24 22 - 32 mmol/L   Glucose, Bld 252 (H) 70 - 99 mg/dL   BUN 14 8 - 23 mg/dL   Creatinine, Ser 1.14 0.61 - 1.24 mg/dL   Calcium 8.7 (L) 8.9 - 10.3 mg/dL   Total Protein 7.5 6.5 - 8.1 g/dL   Albumin 3.3 (L) 3.5 - 5.0 g/dL   AST 40 15 - 41 U/L   ALT 14 0 - 44  U/L   Alkaline Phosphatase 69 38 - 126 U/L   Total Bilirubin 1.8 (H) 0.3 - 1.2 mg/dL   GFR, Estimated >60 >60 mL/min   Anion gap 8 5 - 15  Brain natriuretic peptide  Result Value Ref Range   B Natriuretic Peptide 136.0 (H) 0.0 - 100.0 pg/mL  Troponin I (High Sensitivity)  Result Value Ref Range   Troponin I (High Sensitivity) 3 <18 ng/L   DG Chest Port 1 View  Result Date: 09/12/2022 CLINICAL DATA:  Shortness of breath. Edema in legs and abdomen. Concern for fluid overload EXAM: PORTABLE CHEST  1 VIEW COMPARISON:  Chest radiograph 06/24/2022 and earlier FINDINGS: Of note, the patient is mildly rotated on this portable examination. Postoperative changes of median sternotomy and coronary artery bypass graft. The heart size and mediastinal contours are within normal limits. Both lungs are clear. No pleural effusion or pneumothorax. The visualized skeletal structures are unremarkable. IMPRESSION: No acute cardiopulmonary abnormality. No findings of pulmonary edema as queried. Electronically Signed   By: Ileana Roup M.D.   On: 09/12/2022 18:54   DG Knee AP/LAT W/Sunrise Left  Result Date: 09/07/2022 Imaging left total knee Annual follow-up x-ray Alignment normal with some patellar tilt Loosening none Impression stable prosthesis normal appearance left knee     EKG EKG Interpretation  Date/Time:  Sunday September 12 2022 19:02:27 EDT Ventricular Rate:  72 PR Interval:  153 QRS Duration: 97 QT Interval:  446 QTC Calculation: 489 R Axis:   85 Text Interpretation: Sinus rhythm Nonspecific T wave abnormality Confirmed by Lajean Saver [phone]) on 09/12/2022 7:06:14 PM  Radiology DG Chest Port 1 View  Result Date: 09/12/2022 CLINICAL DATA:  Shortness of breath. Edema in legs and abdomen. Concern for fluid overload EXAM: PORTABLE CHEST 1 VIEW COMPARISON:  Chest radiograph 06/24/2022 and earlier FINDINGS: Of note, the patient is mildly rotated on this portable examination. Postoperative changes of median sternotomy and coronary artery bypass graft. The heart size and mediastinal contours are within normal limits. Both lungs are clear. No pleural effusion or pneumothorax. The visualized skeletal structures are unremarkable. IMPRESSION: No acute cardiopulmonary abnormality. No findings of pulmonary edema as queried. Electronically Signed   By: Ileana Roup M.D.   On: 09/12/2022 18:54    Procedures Procedures    Medications Ordered in ED Medications - No data to display  ED Course/ Medical  Decision Making/ A&P                           Medical Decision Making Problems Addressed: Bilateral leg edema: acute illness or injury with systemic symptoms Cellulitis of right lower leg: acute illness or injury with systemic symptoms Chronic congestive heart failure, unspecified heart failure type [facility]): chronic illness or injury with exacerbation, progression, or side effects of treatment that poses a threat to life or bodily functions Chronic liver disease and cirrhosis ([place]): chronic illness or injury with exacerbation, progression, or side effects of treatment that poses a threat to life or bodily functions Hypoalbuminemia: chronic illness or injury Thrombocytopenia ([place]): chronic illness or injury  Amount and/or Complexity of Data Reviewed Independent Historian: spouse    Details: hx External Data Reviewed: labs and notes. Labs: ordered. Decision-making details documented in ED Course. Radiology: ordered and independent interpretation performed. Decision-making details documented in ED Course. ECG/medicine tests: ordered and independent interpretation performed. Decision-making details documented in ED Course.  Risk Prescription drug management. Decision regarding hospitalization.   Iv ns. Continuous  pulse ox and cardiac monitoring. Labs ordered/sent. Imaging ordered.   Diff dx includes pulm edema, chf, cirrhosis/fluid overload, low albumin, cellulitis - dispo decision including potential need for admission considered - will get labs and imaging and reassess.   Reviewed nursing notes and prior charts for additional history. External reports reviewed. Additional history from:  Cardiac monitor: sinus rhythm, rate 72.  Labs reviewed/interpreted by me - bnp not significantly high.   Xrays reviewed/interpreted by me - no edema.   ?early/mild cellulitis right lower leg. Ancef iv. Also ?whether some of leg edema bil due to hx chr liver dis, low alb, as opposed to acute chf.    Recheck pt, no chest pain, no pleuritic pain, breathing comfortably. Hr 68, rr 16, pulse ox 100% room air.  Pt currently appears stable for d/c.    Rec close pcp/card f/u.  Return precautions provided.            Final Clinical Impression(s) / ED Diagnoses Final diagnoses:  None    Rx / DC Orders ED Discharge Orders     None         Lajean Saver, MD 09/12/22 [phone]

## 2022-09-12 NOTE — Discharge Instructions (Addendum)
It was our pleasure to provide your ER care today - we hope that you feel better.  Elevate legs, wear compression stockings, limit salt intake, and continue your meds.   Take keflex (antibiotic) as prescribed.  Follow up closely with your doctor/cardiologist this coming week.  Return to ER right away if worse, new symptoms, recurrent or persistent chest pain, increased trouble breathing, high fevers, spreading redness, severe pain/swelling, or other concern.

## 2022-09-13 ENCOUNTER — Telehealth: Payer: Self-pay | Admitting: Cardiology

## 2022-09-13 MED ORDER — TORSEMIDE 20 MG PO TABS: 60.0000 mg | ORAL_TABLET | Freq: Two times a day (BID) | ORAL | 11 refills | Status: DC

## 2022-09-13 NOTE — Telephone Encounter (Signed)
New Message:        Patient said he was seen at [facility] yesterday for swelling in his legs and stomach.and Shortness of Breath. They told him to contact Dr Domenic Polite today to see what Sr Domenic Polite wanted him to do. See  notes in Epic.   Pt c/o swelling: STAT is pt has developed SOB within 24 hours  If swelling, where is the swelling located? Legs and stomach  How much weight have you gained and in what time span? 20 lbs in 4 weeks  Have you gained 3 pounds in a day or 5 pounds in a week?   Do you have a log of your daily weights (if so, list)? yes  Are you currently taking a fluid pill? yes  Are you currently SOB? some  Have you traveled recently?  no

## 2022-09-13 NOTE — Telephone Encounter (Signed)
Spoke with pt who states that he was seen recently. He states that he has had weight gain for the last 3 weeks and SOB since yesterday. When asked pt states that his weight has been between 268 and 270 lbs. He was seen in the ER on yesterday and told to call Dr. Domenic Polite on this morning.

## 2022-09-13 NOTE — Telephone Encounter (Signed)
Pt notified to increase Torsemide to 60 mg Daily.

## 2022-09-13 NOTE — Telephone Encounter (Signed)
E-mail confirmed by Jennette Kettle that ref was received last week for ventricular health and that would be calling pt.

## 2022-09-15 DIAGNOSIS — R197 Diarrhea, unspecified: Secondary | ICD-10-CM | POA: Diagnosis not present

## 2022-09-15 DIAGNOSIS — I25118 Atherosclerotic heart disease of native coronary artery with other forms of angina pectoris: Secondary | ICD-10-CM | POA: Diagnosis not present

## 2022-09-15 DIAGNOSIS — E1129 Type 2 diabetes mellitus with other diabetic kidney complication: Secondary | ICD-10-CM | POA: Diagnosis not present

## 2022-09-15 DIAGNOSIS — E6609 Other obesity due to excess calories: Secondary | ICD-10-CM | POA: Diagnosis not present

## 2022-09-15 DIAGNOSIS — K7581 Nonalcoholic steatohepatitis (NASH): Secondary | ICD-10-CM | POA: Diagnosis not present

## 2022-09-15 DIAGNOSIS — I10 Essential (primary) hypertension: Secondary | ICD-10-CM | POA: Diagnosis not present

## 2022-09-15 DIAGNOSIS — I350 Nonrheumatic aortic (valve) stenosis: Secondary | ICD-10-CM | POA: Diagnosis not present

## 2022-09-15 DIAGNOSIS — Z6838 Body mass index (BMI) 38.0-38.9, adult: Secondary | ICD-10-CM | POA: Diagnosis not present

## 2022-09-15 DIAGNOSIS — I5030 Unspecified diastolic (congestive) heart failure: Secondary | ICD-10-CM | POA: Diagnosis not present

## 2022-09-20 ENCOUNTER — Encounter (HOSPITAL_BASED_OUTPATIENT_CLINIC_OR_DEPARTMENT_OTHER): Payer: Self-pay | Admitting: Family

## 2022-09-20 ENCOUNTER — Ambulatory Visit (INDEPENDENT_AMBULATORY_CARE_PROVIDER_SITE_OTHER): Payer: Federal, State, Local not specified - PPO | Admitting: Family

## 2022-09-20 VITALS — BP 126/74 | HR 68 | Ht 70.5 in | Wt 260.0 lb

## 2022-09-20 DIAGNOSIS — G4733 Obstructive sleep apnea (adult) (pediatric): Secondary | ICD-10-CM

## 2022-09-20 DIAGNOSIS — Z951 Presence of aortocoronary bypass graft: Secondary | ICD-10-CM | POA: Diagnosis not present

## 2022-09-20 DIAGNOSIS — I5032 Chronic diastolic (congestive) heart failure: Secondary | ICD-10-CM | POA: Diagnosis not present

## 2022-09-20 DIAGNOSIS — K7460 Unspecified cirrhosis of liver: Secondary | ICD-10-CM

## 2022-09-20 DIAGNOSIS — R188 Other ascites: Secondary | ICD-10-CM

## 2022-09-20 DIAGNOSIS — I2510 Atherosclerotic heart disease of native coronary artery without angina pectoris: Secondary | ICD-10-CM

## 2022-09-20 DIAGNOSIS — I350 Nonrheumatic aortic (valve) stenosis: Secondary | ICD-10-CM

## 2022-09-20 NOTE — Progress Notes (Unsigned)
Office Visit    Patient Name: [name] Date of Encounter: 09/20/2022  PCP:  [name], [place]  Cardiologist:  Rozann Lesches, MD  Advanced Practice Provider:  No care team member to display Electrophysiologist:  None     Chief Complaint    [name] is a 61 y.o. male presents today for follow up of LE edema   Past Medical History    Past Medical History:  Diagnosis Date   Anemia    Arthritis    CAD (coronary artery disease)    Multivessel disease status post CABG 08/2015   Cirrhosis ([place])    CKD (chronic kidney disease) stage 3, GFR 30-59 ml/min (HCC)    Diastolic CHF ([place])    Essential hypertension    Hyperlipidemia    Iron deficiency anemia 07/15/2021   OSA on CPAP    Polyclonal gammopathy    Thrombocytopenia ([place]) 2016   Type 2 diabetes mellitus ([place])    Past Surgical History:  Procedure Laterality Date   APPENDECTOMY     Biceps tendon surgery Right    BIOPSY  11/22/2019   Procedure: BIOPSY;  Surgeon: Daneil Dolin, MD;  Location: [facility];  Service: Endoscopy;;   CARDIAC CATHETERIZATION N/A 08/25/2015   Procedure: Left Heart Cath and Coronary Angiography;  Surgeon: Belva Crome, MD;  Location: [place] [facility];  Service: Cardiovascular;  Laterality: N/A;   CATARACT EXTRACTION Left 08/18/2022   COLONOSCOPY WITH PROPOFOL N/A 11/22/2019   Procedure: COLONOSCOPY WITH PROPOFOL;  Surgeon: Daneil Dolin, MD; Four 4-5 mm polyps, findings suggestive of portal colopathy, congested appearing colonic mucosa diffusely, rectal varices, and adequate right colon prep.  Pathology with tubular adenomas and hyperplastic polyp.  Right colon biopsy with focal active colitis.  Recommendations to repeat colonoscopy in 3 months due to poor prep.   COLONOSCOPY WITH PROPOFOL N/A 03/17/2020   Procedure: COLONOSCOPY WITH PROPOFOL;  Surgeon: Daneil Dolin, MD;  [facility];  Service: Endoscopy;  Laterality:  N/A;  8:45am - pt does not need covid test, was + 2/4 <90 days   CORONARY ARTERY BYPASS GRAFT N/A 08/29/2015   Procedure: CORONARY ARTERY BYPASS GRAFTING (CABG);  Surgeon: [name], MD;  Location: [place];  Service: Open Heart Surgery;  Laterality: N/A;   ESOPHAGEAL BANDING N/A 07/23/2021   Procedure: ESOPHAGEAL BANDING;  Surgeon: Eloise Harman, DO;  [facility];  Service: Endoscopy;  Laterality: N/A;   ESOPHAGOGASTRODUODENOSCOPY (EGD) WITH PROPOFOL N/A 11/22/2019   Procedure: ESOPHAGOGASTRODUODENOSCOPY (EGD) WITH PROPOFOL;  Surgeon: Daneil Dolin, MD; 4 columns of grade 2-3 esophageal varices, portal gastropathy, gastric polyp/abnormal gastric mucosa s/p biopsy.  Pathology with hyperplastic polyp, mild chronic gastritis, negative H. pylori.   ESOPHAGOGASTRODUODENOSCOPY (EGD) WITH PROPOFOL N/A 07/23/2021   Grade 2 esophageal varices without stigmata of bleeding, completely eradicated with banding, portal gastropathy, GAVE without bleeding.   KNEE ARTHROSCOPY Left    TEE WITHOUT CARDIOVERSION N/A 08/29/2015   Procedure: TRANSESOPHAGEAL ECHOCARDIOGRAM (TEE);  Surgeon: [name], MD;  Location: [place];  Service: Open Heart Surgery;  Laterality: N/A;   TOTAL KNEE ARTHROPLASTY Left 03/09/2017   Procedure: TOTAL KNEE ARTHROPLASTY;  Surgeon: Carole Civil, MD;  Location: [facility];  Service: Orthopedics;  Laterality: Left;    Allergies  Allergies  Allergen Reactions   Fluticasone Rash    History of Present Illness    [name] is a 61  y.o. male with a hx of CAD (s/p CABG 2016, PET perfousion 01/2021 inferior scar and mild ischemia with medical mgmt recommended), HFpEF, aortic stenosis, hepatic cirrhosis, adrenal insufficiency, HTN, HLD, Dm2, CKD3, OSA, anemia, thrombocytopenia, polyclonal gammopathy last seen 06/07/22.  Seen 10/2021 by Dr. Debara Pickett after ED vsiit for chest pain. Enzymes negative and symptoms atypical for angina. Seen by Bernerd Pho, PA 01/2022 and was stable. Neurology inquired about armodafinil, but our clinical pharmacist and Dr. Domenic Polite felt cardiac side effects not acceptable to start.   Seen 06/07/22 by Estella Husk, PA due to LE edema. Mostly in right leg which was warm, swollen. Noted high sodium intake with canned soups, hamburger helper, take out. Had been out of Nadolol and K for one week. He was recommended to increase Torsemide to 32m x 3 days then return to 214mdaily. LE duplex with no DVT but did not superficial subcutaneous edema/cellulitis. Further escalation of diuretics deferred due to intermittent orthostatic hypotension for which he takes Midodrine TID. Echo 05/2022 normal LVEF 60-65%, mild LVH, gr1DD, moderate AS(mean gradient 25.0 mmHg).   ED visit 06/24/22 with cough, fatigue, dyspnea diagnosed with bronchitis. Treated with inhaler. As case was mild and concern for hyperglycemia, steroid deferred. Seen at follow up 07/05/22 with weight down 9 pounds and swelling improved.   Saw Dr. McDomenic Polite/27/23 with weight up 20 lbs recommended to increase Torsemide BID for one week and f/u in 6 months. ED visit 09/12/22 with LE edema with questionable early RLE cellulitis treated with Keflex. Contacted Dr. McDomenic Politehe next day and recommended to increase to Torsemide 6074mID and follow up in 7-10 days.   He presents today for follow up.  Weight is down 10 lbs compared to 2 weeks ago. He is taking Torsemide 76m65mM. Unclear whether he took BID dosing. No chest pain. Stable mild exertional dyspnea. LE edema improved. RLE without redness, stable venous stasis color changes. No formal exercise routine.   EKGs/Labs/Other Studies Reviewed:   The following studies were reviewed today:  Echo 05/2022    1. Left ventricular ejection fraction, by estimation, is 60 to 65%. The  left ventricle has normal function. Left ventricular endocardial border  not optimally defined to evaluate regional wall motion. There is  mild left  ventricular hypertrophy. Left  ventricular diastolic parameters are consistent with Grade I diastolic  dysfunction (impaired relaxation).   2. Right ventricular systolic function is normal. The right ventricular  size is normal. There is normal pulmonary artery systolic pressure.   3. The mitral valve is abnormal. Trivial mitral valve regurgitation. No  evidence of mitral stenosis.   4. The tricuspid valve is abnormal.   5. The aortic valve has an indeterminant number of cusps. There is severe  calcifcation of the aortic valve. There is severe thickening of the aortic  valve. Aortic valve regurgitation is not visualized. Moderate aortic valve  stenosis. Aortic valve mean  gradient measures 25.0 mmHg. Aortic valve peak gradient measures 39.4  mmHg. Aortic valve area, by VTI measures 1.08 cm.   6. The inferior vena cava is normal in size with greater than 50%  respiratory variability, suggesting right atrial pressure of 3 mmHg.  Echocardiogram: 05/2020 IMPRESSIONS     1. Left ventricular ejection fraction, by estimation, is 60 to 65%. The  left ventricle has normal function. The left ventricle has no regional  wall motion abnormalities. The left ventricular internal cavity size was  mildly dilated. There is mild left  ventricular hypertrophy.  Left ventricular diastolic parameters are  indeterminate.   2. Right ventricular systolic function is normal. The right ventricular  size is normal. There is normal pulmonary artery systolic pressure.   3. The mitral valve is abnormal. Mild mitral valve regurgitation.   4. AV is thickened, calcifieid Difficult to see well. Peak and mean  gradients through the valve are 31 and 18 mm Hg respectively consistent  with mild AS. Compared to echo report from 2019, gradients are increased.  . The aortic valve is abnormal. Aortic  valve regurgitation is not visualized. Mild aortic valve stenosis.   5. The inferior vena cava is dilated in size  with <50% respiratory  variability, suggesting right atrial pressure of 15 mmHg.    Carotid Dopplers: 05/2020 Summary:  Right Carotid: Velocities in the right ICA are consistent with a 1-39%  stenosis.   Left Carotid: Velocities in the left ICA are consistent with a 40-59%  stenosis.   Echocardiogram: 12/2020 NORMAL LEFT VENTRICULAR SYSTOLIC FUNCTION    NORMAL RIGHT VENTRICULAR SYSTOLIC FUNCTION    NO VALVULAR REGURGITATION    VALVULAR STENOSIS: MILD AS    POOR SOUND TRANSMISSION     EKG:  No EKG today.   Recent Labs: 09/12/2022: ALT 14; B Natriuretic Peptide 136.0; BUN 14; Creatinine, Ser 1.14; Hemoglobin 12.0; Platelets 106; Potassium 4.4; Sodium 136  Recent Lipid Panel    Component Value Date/Time   CHOL 120 05/25/2019 1001   TRIG 136 05/25/2019 1001   HDL 38 (L) 05/25/2019 1001   CHOLHDL 3.2 05/25/2019 1001   VLDL 27 05/25/2019 1001   LDLCALC 55 05/25/2019 1001    Home Medications   Current Meds  Medication Sig   ACCU-CHEK GUIDE test strip 3 (three) times daily.   acetaminophen (TYLENOL) 325 MG tablet Take 650 mg by mouth every 6 (six) hours as needed for mild pain.   albuterol (VENTOLIN HFA) 108 (90 Base) MCG/ACT inhaler Inhale 2 puffs into the lungs every 6 (six) hours as needed for wheezing or shortness of breath.   ALPRAZolam (XANAX) 0.25 MG tablet Take by mouth.   Armodafinil 50 MG tablet Take 1 tablet (50 mg total) by mouth as needed.   aspirin EC 81 MG tablet Take 1 tablet (81 mg total) by mouth daily with breakfast. (Patient taking differently: Take 81 mg by mouth every evening.)   BD PEN NEEDLE NANO 2ND GEN 32G X 4 MM MISC    budesonide-formoterol (SYMBICORT) 160-4.5 MCG/ACT inhaler Inhale 2 puffs into the lungs 2 (two) times daily.   cephALEXin (KEFLEX) 500 MG capsule Take 1 capsule (500 mg total) by mouth 4 (four) times daily.   Cholecalciferol (DIALYVITE VITAMIN D 5000) 125 MCG (5000 UT) capsule Take 5,000 Units by mouth in the morning.    COMBIVENT  RESPIMAT 20-100 MCG/ACT AERS respimat Inhale 1 puff into the lungs 4 (four) times daily.   Continuous Blood Gluc Sensor (FREESTYLE LIBRE 14 DAY SENSOR) MISC SMARTSIG:1 Each Topical Every 2 Weeks   ferrous sulfate 325 (65 FE) MG tablet Take 1 tablet (325 mg total) by mouth daily with breakfast.   insulin aspart (NOVOLOG) 100 UNIT/ML injection Inject 5 Units into the skin 3 (three) times daily before meals. Special Instructions: If accu-check is greater than 150. Hold for accu-check 150 or below. With Meals   insulin degludec (TRESIBA FLEXTOUCH) 200 UNIT/ML FlexTouch Pen Inject 40 Units into the skin in the morning. Give 30 units Subcutaneous  at Bedtime 8:00 pm (Patient taking differently: Inject 30  Units into the skin See admin instructions. Give 30 units Subcutaneous  every evening)   lactulose (CHRONULAC) 10 GM/15ML solution Take 30 mLs (20 g total) by mouth daily.   loratadine (CLARITIN) 10 MG tablet Take 10 mg by mouth in the morning.    magnesium oxide (MAG-OX) 400 (240 Mg) MG tablet Take 1 tablet (400 mg total) by mouth daily.   midodrine (PROAMATINE) 10 MG tablet Take 1 tablet (10 mg total) by mouth 3 (three) times daily with meals.   nadolol (CORGARD) 20 MG tablet Take 1 tablet (20 mg total) by mouth daily.   OZEMPIC, 0.25 OR 0.5 MG/DOSE, 2 MG/1.5ML SOPN Inject into the skin.   potassium chloride SA (KLOR-CON M) 20 MEQ tablet Take 20 mEq by mouth daily.   pravastatin (PRAVACHOL) 20 MG tablet TAKE 1 TABLET(20 MG) BY MOUTH AT BEDTIME   propranolol (INDERAL) 40 MG tablet Take 40 mg by mouth daily.   spironolactone (ALDACTONE) 25 MG tablet Take 0.5 tablets (12.5 mg total) by mouth daily.   torsemide (DEMADEX) 20 MG tablet Take 3 tablets (60 mg total) by mouth 2 (two) times daily.   traMADol (ULTRAM) 50 MG tablet TAKE 1 TABLET(50 MG) BY MOUTH EVERY 6 HOURS AS NEEDED   venlafaxine XR (EFFEXOR-XR) 75 MG 24 hr capsule Take 1 capsule (75 mg total) by mouth daily with breakfast.     Review of  Systems      All other systems reviewed and are otherwise negative except as noted above.  Physical Exam    VS:  BP 126/74   Pulse 68   Ht 5' 10.5" (1.791 m)   Wt 260 lb (117.9 kg)   BMI 36.78 kg/m  , BMI Body mass index is 36.78 kg/m.  Wt Readings from Last 3 Encounters:  09/20/22 260 lb (117.9 kg)  09/12/22 270 lb (122.5 kg)  09/09/22 270 lb 15.1 oz (122.9 kg)    GEN: Well nourished, overweight, well developed, in no acute distress. HEENT: normal. Neck: Supple, no JVD, carotid bruits, or masses. Cardiac: RRR, no murmurs, rubs, or gallops. No clubbing, cyanosis, edema.  Radials/PT 2+ and equal bilaterally.  Respiratory:  Respirations regular and unlabored, clear to auscultation bilaterally. GI: Soft, nontender, nondistended. MS: No deformity or atrophy. Skin: Warm and dry, no rash. Neuro:  Strength and sensation are intact. Psych: Normal affect.  Assessment & Plan    CAD - CABG in 2016 and PET perfusion study 01/2021 inferior scar and mild ischemia with medical management recommended. GDMT aspirin, Nadolol, Pravastatin. Heart healthy diet and regular cardiovascular exercise encouraged.    HFpEF - Volume status improved, down 10 pounds. Continue Torsemide 23m QD with additional 415mPRN for weight gain of 2 pounds overnight or 5 pounds in 1 week. Low sodium diet, fluid restriction <2L, and daily weights encouraged. Educated to contact our office for weight gain of 2 lbs overnight or 5 lbs in one week. Update BMP, BNP to determine next steps with Torsemide dosing.   AS - Mild by echo 05/2020. Repeat echo 05/2022 moderate AS. Monitor with annual echo. Repeat echo ordered for 05/2023. Continue optimal BP control.   Obesity - Weight loss via diet and exercise encouraged. Discussed the impact being overweight would have on cardiovascular risk. Refer to PREP exercise program.  Heppatic cirrhosis - GDMT lactulose, spironolactone, nadolol, torsemide. Managed by GI.  OSA - CPAP  compliance encouraged.   Adrenal insufficiency - On Midodrine due to hypotension. No recurrent hypotension  Disposition: Follow up  in 3 month(s) with Rozann Lesches, MD  Signed, Loel Dubonnet, NP 09/20/2022, 10:28 AM [place]

## 2022-09-20 NOTE — Patient Instructions (Signed)
Medication Instructions:  Your Physician recommend you continue on your current medication as directed.    Continue Torsemide 35m daily with an extra 411min the afternoon for swelling or 2 pounds gained overnight or 5 pound gained in one week.   *If you need a refill on your cardiac medications before your next appointment, please call your pharmacy*   Lab Work: Your physician recommends that you return for lab work today- BMP, BNP  If you have labs (blood work) drawn today and your tests are completely normal, you will receive your results only by: MyChart Message (if you have MyChart) OR A paper copy in the mail If you have any lab test that is abnormal or we need to change your treatment, we will call you to review the results.  Follow-Up: [facility] our [facility].  As part of our continuing mission to provide you with exceptional heart care, we have created designated Provider Care Teams.  These Care Teams include your primary Cardiologist (physician) and Advanced Practice Providers (APPs -  Physician Assistants and Nurse Practitioners) who all work together to provide you with the care you need, when you need it.  We recommend signing up for the patient portal called "MyChart".  Sign up information is provided on this After Visit Summary.  MyChart is used to connect with patients for Virtual Visits (Telemedicine).  Patients are able to view lab/test results, encounter notes, upcoming appointments, etc.  Non-urgent messages can be sent to your provider as well.   To learn more about what you can do with MyChart, go to [email or web address]   Your next appointment:   3 month(s)  The format for your next appointment:   In Person  Provider:   Dr. McDomenic PoliteReLinna HoffPP or CaLaurann MontanaNP    Other Instructions Heart Healthy Diet Recommendations: A low-salt diet is recommended. Meats should be grilled, baked, or boiled. Avoid fried  foods. Focus on lean protein sources like fish or chicken with vegetables and fruits. The American Heart Association is a GRMicrobiologist American Heart Association Diet and Lifeystyle Recommendations   Exercise recommendations: The American Heart Association recommends 150 minutes of moderate intensity exercise weekly. Try 30 minutes of moderate intensity exercise 4-5 times per week. This could include walking, jogging, or swimming.   Important Information About Sugar

## 2022-09-21 ENCOUNTER — Encounter (HOSPITAL_BASED_OUTPATIENT_CLINIC_OR_DEPARTMENT_OTHER): Payer: Self-pay | Admitting: Family

## 2022-09-21 LAB — BASIC METABOLIC PANEL
BUN/Creatinine Ratio: 11 (ref 10–24)
BUN: 14 mg/dL (ref 8–27)
CO2: 23 mmol/L (ref 20–29)
Calcium: 9.1 mg/dL (ref 8.6–10.2)
Chloride: 98 mmol/L (ref 96–106)
Creatinine, Ser: 1.27 mg/dL (ref 0.76–1.27)
Glucose: 280 mg/dL — ABNORMAL HIGH (ref 70–99)
Potassium: 3.6 mmol/L (ref 3.5–5.2)
Sodium: 139 mmol/L (ref 134–144)
eGFR: 64 mL/min/{1.73_m2} (ref >59)

## 2022-09-21 LAB — BRAIN NATRIURETIC PEPTIDE: BNP: 110 pg/mL — ABNORMAL HIGH (ref 0.0–100.0)

## 2022-09-22 ENCOUNTER — Telehealth (HOSPITAL_BASED_OUTPATIENT_CLINIC_OR_DEPARTMENT_OTHER): Payer: Self-pay

## 2022-09-22 ENCOUNTER — Telehealth: Payer: Self-pay | Admitting: *Deleted

## 2022-09-22 ENCOUNTER — Ambulatory Visit (HOSPITAL_BASED_OUTPATIENT_CLINIC_OR_DEPARTMENT_OTHER): Payer: Federal, State, Local not specified - PPO | Admitting: Family

## 2022-09-22 DIAGNOSIS — Z7951 Long term (current) use of inhaled steroids: Secondary | ICD-10-CM | POA: Diagnosis not present

## 2022-09-22 DIAGNOSIS — E7800 Pure hypercholesterolemia, unspecified: Secondary | ICD-10-CM | POA: Diagnosis not present

## 2022-09-22 DIAGNOSIS — N189 Chronic kidney disease, unspecified: Secondary | ICD-10-CM | POA: Diagnosis not present

## 2022-09-22 DIAGNOSIS — G4733 Obstructive sleep apnea (adult) (pediatric): Secondary | ICD-10-CM | POA: Diagnosis not present

## 2022-09-22 DIAGNOSIS — E1122 Type 2 diabetes mellitus with diabetic chronic kidney disease: Secondary | ICD-10-CM | POA: Diagnosis not present

## 2022-09-22 DIAGNOSIS — E1136 Type 2 diabetes mellitus with diabetic cataract: Secondary | ICD-10-CM | POA: Diagnosis not present

## 2022-09-22 DIAGNOSIS — I129 Hypertensive chronic kidney disease with stage 1 through stage 4 chronic kidney disease, or unspecified chronic kidney disease: Secondary | ICD-10-CM | POA: Diagnosis not present

## 2022-09-22 DIAGNOSIS — Z79899 Other long term (current) drug therapy: Secondary | ICD-10-CM | POA: Diagnosis not present

## 2022-09-22 DIAGNOSIS — H2511 Age-related nuclear cataract, right eye: Secondary | ICD-10-CM | POA: Diagnosis not present

## 2022-09-22 DIAGNOSIS — H52223 Regular astigmatism, bilateral: Secondary | ICD-10-CM | POA: Diagnosis not present

## 2022-09-22 DIAGNOSIS — I2510 Atherosclerotic heart disease of native coronary artery without angina pectoris: Secondary | ICD-10-CM | POA: Diagnosis not present

## 2022-09-22 DIAGNOSIS — Z794 Long term (current) use of insulin: Secondary | ICD-10-CM | POA: Diagnosis not present

## 2022-09-22 DIAGNOSIS — H5703 Miosis: Secondary | ICD-10-CM | POA: Diagnosis not present

## 2022-09-22 DIAGNOSIS — Z961 Presence of intraocular lens: Secondary | ICD-10-CM | POA: Diagnosis not present

## 2022-09-22 DIAGNOSIS — Z7982 Long term (current) use of aspirin: Secondary | ICD-10-CM | POA: Diagnosis not present

## 2022-09-22 NOTE — Telephone Encounter (Signed)
Contacted patient regarding PREP Class referral. Unable to leave voice message, mail box full. Sent a text including reason for call and requested call back.

## 2022-09-22 NOTE — Telephone Encounter (Addendum)
Call attempt, no answer, unable to leave message due to full voicemail.     ----- Message from Loel Dubonnet, NP sent at 09/22/2022  7:58 AM EDT ----- Normal kidney function, electrolytes. BNP mildly elevated. Would continue Torsemide 36m QD with additional 467mas needed in the afternoon for weight gain 2 lbs over night or 5 lbs in one week. Repeat BMP/BNP in 2-3 weeks for monitoring.

## 2022-09-23 ENCOUNTER — Other Ambulatory Visit (HOSPITAL_BASED_OUTPATIENT_CLINIC_OR_DEPARTMENT_OTHER): Payer: Self-pay

## 2022-09-23 ENCOUNTER — Telehealth (HOSPITAL_BASED_OUTPATIENT_CLINIC_OR_DEPARTMENT_OTHER): Payer: Self-pay

## 2022-09-23 ENCOUNTER — Telehealth: Payer: Self-pay | Admitting: *Deleted

## 2022-09-23 DIAGNOSIS — I5032 Chronic diastolic (congestive) heart failure: Secondary | ICD-10-CM

## 2022-09-23 MED ORDER — COVID-19 MRNA 2023-2024 VACCINE (COMIRNATY) 0.3 ML INJECTION
INTRAMUSCULAR | 0 refills | Status: DC
  Filled 2022-09-23: qty 0.3, 1d supply, fill #0

## 2022-09-23 MED ORDER — INFLUENZA VAC SPLIT QUAD 0.5 ML IM SUSY
PREFILLED_SYRINGE | INTRAMUSCULAR | 0 refills | Status: DC
  Filled 2022-09-23: qty 0.5, 1d supply, fill #0

## 2022-09-23 NOTE — Telephone Encounter (Addendum)
Results called to patient who verbalizes understanding!  Labs mailed to patient.     ----- Message from Loel Dubonnet, NP sent at 09/22/2022  7:58 AM EDT ----- Normal kidney function, electrolytes. BNP mildly elevated. Would continue Torsemide 78m QD with additional 420mas needed in the afternoon for weight gain 2 lbs over night or 5 lbs in one week. Repeat BMP/BNP in 2-3 weeks for monitoring.

## 2022-09-23 NOTE — Telephone Encounter (Signed)
Patient returned call and would like to participate in the PREP class at the [facility]. Class start date is 11/09/2022.

## 2022-09-25 ENCOUNTER — Other Ambulatory Visit: Payer: Self-pay | Admitting: Cardiology

## 2022-10-15 DIAGNOSIS — I25118 Atherosclerotic heart disease of native coronary artery with other forms of angina pectoris: Secondary | ICD-10-CM | POA: Diagnosis not present

## 2022-10-15 DIAGNOSIS — I5030 Unspecified diastolic (congestive) heart failure: Secondary | ICD-10-CM | POA: Diagnosis not present

## 2022-10-15 DIAGNOSIS — I350 Nonrheumatic aortic (valve) stenosis: Secondary | ICD-10-CM | POA: Diagnosis not present

## 2022-10-15 DIAGNOSIS — R197 Diarrhea, unspecified: Secondary | ICD-10-CM | POA: Diagnosis not present

## 2022-10-17 DIAGNOSIS — R5381 Other malaise: Secondary | ICD-10-CM | POA: Diagnosis not present

## 2022-10-17 DIAGNOSIS — T148XXA Other injury of unspecified body region, initial encounter: Secondary | ICD-10-CM | POA: Diagnosis not present

## 2022-10-20 ENCOUNTER — Encounter: Payer: Self-pay | Admitting: Cardiology

## 2022-10-20 ENCOUNTER — Other Ambulatory Visit: Payer: Self-pay

## 2022-10-20 ENCOUNTER — Encounter (HOSPITAL_BASED_OUTPATIENT_CLINIC_OR_DEPARTMENT_OTHER): Payer: Self-pay

## 2022-10-20 DIAGNOSIS — Z79899 Other long term (current) drug therapy: Secondary | ICD-10-CM

## 2022-10-20 MED ORDER — TORSEMIDE 20 MG PO TABS: ORAL_TABLET | ORAL | 3 refills | Status: DC

## 2022-10-21 ENCOUNTER — Observation Stay (HOSPITAL_COMMUNITY): Payer: Federal, State, Local not specified - PPO

## 2022-10-21 ENCOUNTER — Inpatient Hospital Stay (HOSPITAL_COMMUNITY)
Admission: EM | Admit: 2022-10-21 | Discharge: 2022-10-23 | DRG: 947 | Disposition: A | Discharge status: Home / Self Care | Payer: Federal, State, Local not specified - PPO | Attending: Family Medicine | Admitting: Family Medicine

## 2022-10-21 ENCOUNTER — Emergency Department (HOSPITAL_COMMUNITY): Payer: Federal, State, Local not specified - PPO

## 2022-10-21 ENCOUNTER — Other Ambulatory Visit: Payer: Self-pay

## 2022-10-21 ENCOUNTER — Encounter (HOSPITAL_COMMUNITY): Payer: Self-pay | Admitting: Emergency Medicine

## 2022-10-21 DIAGNOSIS — Z794 Long term (current) use of insulin: Secondary | ICD-10-CM | POA: Diagnosis not present

## 2022-10-21 DIAGNOSIS — I9589 Other hypotension: Secondary | ICD-10-CM

## 2022-10-21 DIAGNOSIS — Z86010 Personal history of colonic polyps: Secondary | ICD-10-CM

## 2022-10-21 DIAGNOSIS — I10 Essential (primary) hypertension: Secondary | ICD-10-CM | POA: Diagnosis not present

## 2022-10-21 DIAGNOSIS — I959 Hypotension, unspecified: Secondary | ICD-10-CM | POA: Diagnosis not present

## 2022-10-21 DIAGNOSIS — Z96652 Presence of left artificial knee joint: Secondary | ICD-10-CM | POA: Diagnosis present

## 2022-10-21 DIAGNOSIS — K7581 Nonalcoholic steatohepatitis (NASH): Secondary | ICD-10-CM | POA: Diagnosis not present

## 2022-10-21 DIAGNOSIS — E785 Hyperlipidemia, unspecified: Secondary | ICD-10-CM | POA: Diagnosis present

## 2022-10-21 DIAGNOSIS — K7290 Hepatic failure, unspecified without coma: Secondary | ICD-10-CM | POA: Diagnosis present

## 2022-10-21 DIAGNOSIS — Z951 Presence of aortocoronary bypass graft: Secondary | ICD-10-CM

## 2022-10-21 DIAGNOSIS — R0789 Other chest pain: Secondary | ICD-10-CM | POA: Diagnosis not present

## 2022-10-21 DIAGNOSIS — E669 Obesity, unspecified: Secondary | ICD-10-CM | POA: Diagnosis not present

## 2022-10-21 DIAGNOSIS — R188 Other ascites: Principal | ICD-10-CM | POA: Diagnosis present

## 2022-10-21 DIAGNOSIS — K219 Gastro-esophageal reflux disease without esophagitis: Secondary | ICD-10-CM | POA: Diagnosis present

## 2022-10-21 DIAGNOSIS — I350 Nonrheumatic aortic (valve) stenosis: Secondary | ICD-10-CM | POA: Diagnosis present

## 2022-10-21 DIAGNOSIS — K766 Portal hypertension: Secondary | ICD-10-CM | POA: Diagnosis not present

## 2022-10-21 DIAGNOSIS — Z6837 Body mass index (BMI) 37.0-37.9, adult: Secondary | ICD-10-CM

## 2022-10-21 DIAGNOSIS — N1831 Chronic kidney disease, stage 3a: Secondary | ICD-10-CM | POA: Diagnosis not present

## 2022-10-21 DIAGNOSIS — R079 Chest pain, unspecified: Secondary | ICD-10-CM | POA: Diagnosis not present

## 2022-10-21 DIAGNOSIS — Z23 Encounter for immunization: Secondary | ICD-10-CM

## 2022-10-21 DIAGNOSIS — E1149 Type 2 diabetes mellitus with other diabetic neurological complication: Secondary | ICD-10-CM | POA: Diagnosis present

## 2022-10-21 DIAGNOSIS — K7460 Unspecified cirrhosis of liver: Secondary | ICD-10-CM | POA: Diagnosis present

## 2022-10-21 DIAGNOSIS — Z7982 Long term (current) use of aspirin: Secondary | ICD-10-CM

## 2022-10-21 DIAGNOSIS — K7469 Other cirrhosis of liver: Secondary | ICD-10-CM | POA: Diagnosis not present

## 2022-10-21 DIAGNOSIS — E2740 Unspecified adrenocortical insufficiency: Secondary | ICD-10-CM | POA: Diagnosis present

## 2022-10-21 DIAGNOSIS — I130 Hypertensive heart and chronic kidney disease with heart failure and stage 1 through stage 4 chronic kidney disease, or unspecified chronic kidney disease: Secondary | ICD-10-CM | POA: Diagnosis not present

## 2022-10-21 DIAGNOSIS — Z800 Family history of malignant neoplasm of digestive organs: Secondary | ICD-10-CM

## 2022-10-21 DIAGNOSIS — Z833 Family history of diabetes mellitus: Secondary | ICD-10-CM

## 2022-10-21 DIAGNOSIS — R1084 Generalized abdominal pain: Secondary | ICD-10-CM | POA: Diagnosis not present

## 2022-10-21 DIAGNOSIS — I8510 Secondary esophageal varices without bleeding: Secondary | ICD-10-CM | POA: Diagnosis present

## 2022-10-21 DIAGNOSIS — Z792 Long term (current) use of antibiotics: Secondary | ICD-10-CM

## 2022-10-21 DIAGNOSIS — Z7985 Long-term (current) use of injectable non-insulin antidiabetic drugs: Secondary | ICD-10-CM

## 2022-10-21 DIAGNOSIS — R161 Splenomegaly, not elsewhere classified: Secondary | ICD-10-CM | POA: Diagnosis present

## 2022-10-21 DIAGNOSIS — I5032 Chronic diastolic (congestive) heart failure: Secondary | ICD-10-CM | POA: Diagnosis not present

## 2022-10-21 DIAGNOSIS — I2510 Atherosclerotic heart disease of native coronary artery without angina pectoris: Secondary | ICD-10-CM | POA: Diagnosis present

## 2022-10-21 DIAGNOSIS — Z8249 Family history of ischemic heart disease and other diseases of the circulatory system: Secondary | ICD-10-CM

## 2022-10-21 DIAGNOSIS — N183 Chronic kidney disease, stage 3 unspecified: Secondary | ICD-10-CM | POA: Diagnosis present

## 2022-10-21 DIAGNOSIS — E8770 Fluid overload, unspecified: Secondary | ICD-10-CM | POA: Diagnosis present

## 2022-10-21 DIAGNOSIS — M1990 Unspecified osteoarthritis, unspecified site: Secondary | ICD-10-CM | POA: Diagnosis present

## 2022-10-21 DIAGNOSIS — Z79899 Other long term (current) drug therapy: Secondary | ICD-10-CM | POA: Diagnosis not present

## 2022-10-21 DIAGNOSIS — Z7951 Long term (current) use of inhaled steroids: Secondary | ICD-10-CM

## 2022-10-21 DIAGNOSIS — R601 Generalized edema: Secondary | ICD-10-CM | POA: Diagnosis present

## 2022-10-21 DIAGNOSIS — E1122 Type 2 diabetes mellitus with diabetic chronic kidney disease: Secondary | ICD-10-CM | POA: Diagnosis present

## 2022-10-21 DIAGNOSIS — Z9842 Cataract extraction status, left eye: Secondary | ICD-10-CM

## 2022-10-21 DIAGNOSIS — E119 Type 2 diabetes mellitus without complications: Secondary | ICD-10-CM

## 2022-10-21 DIAGNOSIS — R1013 Epigastric pain: Secondary | ICD-10-CM | POA: Diagnosis not present

## 2022-10-21 DIAGNOSIS — G4733 Obstructive sleep apnea (adult) (pediatric): Secondary | ICD-10-CM | POA: Diagnosis present

## 2022-10-21 DIAGNOSIS — K767 Hepatorenal syndrome: Secondary | ICD-10-CM | POA: Diagnosis not present

## 2022-10-21 DIAGNOSIS — D509 Iron deficiency anemia, unspecified: Secondary | ICD-10-CM | POA: Diagnosis present

## 2022-10-21 DIAGNOSIS — Z807 Family history of other malignant neoplasms of lymphoid, hematopoietic and related tissues: Secondary | ICD-10-CM

## 2022-10-21 DIAGNOSIS — K8020 Calculus of gallbladder without cholecystitis without obstruction: Secondary | ICD-10-CM | POA: Diagnosis present

## 2022-10-21 DIAGNOSIS — Z820 Family history of epilepsy and other diseases of the nervous system: Secondary | ICD-10-CM

## 2022-10-21 DIAGNOSIS — Z8719 Personal history of other diseases of the digestive system: Secondary | ICD-10-CM

## 2022-10-21 DIAGNOSIS — I81 Portal vein thrombosis: Secondary | ICD-10-CM | POA: Diagnosis not present

## 2022-10-21 DIAGNOSIS — D890 Polyclonal hypergammaglobulinemia: Secondary | ICD-10-CM | POA: Diagnosis not present

## 2022-10-21 DIAGNOSIS — T68XXXA Hypothermia, initial encounter: Secondary | ICD-10-CM | POA: Diagnosis not present

## 2022-10-21 DIAGNOSIS — I5033 Acute on chronic diastolic (congestive) heart failure: Secondary | ICD-10-CM | POA: Diagnosis present

## 2022-10-21 DIAGNOSIS — Z888 Allergy status to other drugs, medicaments and biological substances status: Secondary | ICD-10-CM

## 2022-10-21 LAB — GLUCOSE, PLEURAL OR PERITONEAL FLUID: Glucose, Fluid: 217 mg/dL

## 2022-10-21 LAB — BASIC METABOLIC PANEL
Anion gap: 8 (ref 5–15)
BUN: 19 mg/dL (ref 8–23)
CO2: 27 mmol/L (ref 22–32)
Calcium: 8.5 mg/dL — ABNORMAL LOW (ref 8.9–10.3)
Chloride: 99 mmol/L (ref 98–111)
Creatinine, Ser: 1.36 mg/dL — ABNORMAL HIGH (ref 0.61–1.24)
GFR, Estimated: 59 mL/min — ABNORMAL LOW (ref >60)
Glucose, Bld: 164 mg/dL — ABNORMAL HIGH (ref 70–99)
Potassium: 4 mmol/L (ref 3.5–5.1)
Sodium: 134 mmol/L — ABNORMAL LOW (ref 135–145)

## 2022-10-21 LAB — GLUCOSE, CAPILLARY
Glucose-Capillary: 213 mg/dL — ABNORMAL HIGH (ref 70–99)
Glucose-Capillary: 208 mg/dL — ABNORMAL HIGH (ref 70–99)
Glucose-Capillary: 197 mg/dL — ABNORMAL HIGH (ref 70–99)
Glucose-Capillary: 269 mg/dL — ABNORMAL HIGH (ref 70–99)

## 2022-10-21 LAB — BODY FLUID CELL COUNT WITH DIFFERENTIAL
Eos, Fluid: 1 %
Lymphs, Fluid: 58 %
Monocyte-Macrophage-Serous Fluid: 38 % — ABNORMAL LOW (ref 50–90)
Neutrophil Count, Fluid: 3 % (ref 0–25)
Total Nucleated Cell Count, Fluid: 277 cu mm (ref 0–1000)

## 2022-10-21 LAB — GRAM STAIN

## 2022-10-21 LAB — HEPATIC FUNCTION PANEL
ALT: 11 U/L (ref 0–44)
AST: 32 U/L (ref 15–41)
Albumin: 2.8 g/dL — ABNORMAL LOW (ref 3.5–5.0)
Alkaline Phosphatase: 62 U/L (ref 38–126)
Bilirubin, Direct: 0.5 mg/dL — ABNORMAL HIGH (ref 0.0–0.2)
Indirect Bilirubin: 1.4 mg/dL — ABNORMAL HIGH (ref 0.3–0.9)
Total Bilirubin: 1.9 mg/dL — ABNORMAL HIGH (ref 0.3–1.2)
Total Protein: 6.8 g/dL (ref 6.5–8.1)

## 2022-10-21 LAB — PROTEIN, PLEURAL OR PERITONEAL FLUID: Total protein, fluid: <3 g/dL

## 2022-10-21 LAB — CBC
HCT: 33.1 % — ABNORMAL LOW (ref 39.0–52.0)
Hemoglobin: 11.4 g/dL — ABNORMAL LOW (ref 13.0–17.0)
MCH: 32.6 pg (ref 26.0–34.0)
MCHC: 34.4 g/dL (ref 30.0–36.0)
MCV: 94.6 fL (ref 80.0–100.0)
Platelets: 131 10*3/uL — ABNORMAL LOW (ref 150–400)
RBC: 3.5 MIL/uL — ABNORMAL LOW (ref 4.22–5.81)
RDW: 15.9 % — ABNORMAL HIGH (ref 11.5–15.5)
WBC: 4.6 10*3/uL (ref 4.0–10.5)
nRBC: 0 % (ref 0.0–0.2)

## 2022-10-21 LAB — PROTIME-INR
INR: 1.3 — ABNORMAL HIGH (ref 0.8–1.2)
Prothrombin Time: 16.4 seconds — ABNORMAL HIGH (ref 11.4–15.2)

## 2022-10-21 LAB — ALBUMIN, PLEURAL OR PERITONEAL FLUID: Albumin, Fluid: <1.5 g/dL

## 2022-10-21 LAB — HIV ANTIBODY (ROUTINE TESTING W REFLEX): HIV Screen 4th Generation wRfx: NONREACTIVE

## 2022-10-21 LAB — TROPONIN I (HIGH SENSITIVITY)
Troponin I (High Sensitivity): 4 ng/L (ref <18)
Troponin I (High Sensitivity): 5 ng/L (ref <18)

## 2022-10-21 LAB — HEMOGLOBIN A1C
Hgb A1c MFr Bld: 5.6 % (ref 4.8–5.6)
Mean Plasma Glucose: 114.02 mg/dL

## 2022-10-21 MED ORDER — INSULIN ASPART 100 UNIT/ML IJ SOLN
0.0000 [IU] | Freq: Every day | INTRAMUSCULAR | Status: DC
  Administered 2022-10-21: 3 [IU] via SUBCUTANEOUS
  Administered 2022-10-22: 4 [IU] via SUBCUTANEOUS

## 2022-10-21 MED ORDER — NADOLOL 20 MG PO TABS
20.0000 mg | ORAL_TABLET | Freq: Every day | ORAL | Status: DC
  Administered 2022-10-21 – 2022-10-23 (×3): 20 mg via ORAL
  Filled 2022-10-21 (×5): qty 1

## 2022-10-21 MED ORDER — PRAVASTATIN SODIUM 10 MG PO TABS
20.0000 mg | ORAL_TABLET | Freq: Every day | ORAL | Status: DC
  Administered 2022-10-21 – 2022-10-22 (×2): 20 mg via ORAL
  Filled 2022-10-21 (×2): qty 2

## 2022-10-21 MED ORDER — ACETAMINOPHEN 325 MG PO TABS
650.0000 mg | ORAL_TABLET | ORAL | Status: DC | PRN
  Administered 2022-10-23: 650 mg via ORAL
  Filled 2022-10-21: qty 2

## 2022-10-21 MED ORDER — ASPIRIN 81 MG PO TBEC
81.0000 mg | DELAYED_RELEASE_TABLET | Freq: Every evening | ORAL | Status: DC
  Administered 2022-10-21 – 2022-10-22 (×2): 81 mg via ORAL
  Filled 2022-10-21 (×2): qty 1

## 2022-10-21 MED ORDER — ONDANSETRON HCL 4 MG/2ML IJ SOLN: 4.0000 mg | Freq: Four times a day (QID) | INTRAMUSCULAR | Status: DC | PRN

## 2022-10-21 MED ORDER — FUROSEMIDE 10 MG/ML IJ SOLN
40.0000 mg | Freq: Once | INTRAMUSCULAR | Status: AC
  Administered 2022-10-21: 40 mg via INTRAVENOUS
  Filled 2022-10-21: qty 4

## 2022-10-21 MED ORDER — SPIRONOLACTONE 12.5 MG HALF TABLET
12.5000 mg | ORAL_TABLET | Freq: Every day | ORAL | Status: DC
  Administered 2022-10-21: 12.5 mg via ORAL
  Filled 2022-10-21: qty 1

## 2022-10-21 MED ORDER — HEPARIN SODIUM (PORCINE) 5000 UNIT/ML IJ SOLN
5000.0000 [IU] | Freq: Three times a day (TID) | INTRAMUSCULAR | Status: DC
  Administered 2022-10-21 – 2022-10-23 (×7): 5000 [IU] via SUBCUTANEOUS
  Filled 2022-10-21 (×6): qty 1

## 2022-10-21 MED ORDER — INSULIN ASPART 100 UNIT/ML IJ SOLN
0.0000 [IU] | Freq: Three times a day (TID) | INTRAMUSCULAR | Status: DC
  Administered 2022-10-21 (×2): 5 [IU] via SUBCUTANEOUS
  Administered 2022-10-21 – 2022-10-22 (×2): 3 [IU] via SUBCUTANEOUS
  Administered 2022-10-22: 2 [IU] via SUBCUTANEOUS
  Administered 2022-10-22: 5 [IU] via SUBCUTANEOUS
  Administered 2022-10-23: 3 [IU] via SUBCUTANEOUS
  Administered 2022-10-23: 5 [IU] via SUBCUTANEOUS

## 2022-10-21 MED ORDER — IPRATROPIUM-ALBUTEROL 0.5-2.5 (3) MG/3ML IN SOLN
3.0000 mL | Freq: Two times a day (BID) | RESPIRATORY_TRACT | Status: DC
  Administered 2022-10-22 (×2): 3 mL via RESPIRATORY_TRACT
  Filled 2022-10-21 (×2): qty 3

## 2022-10-21 MED ORDER — PNEUMOCOCCAL VAC POLYVALENT 25 MCG/0.5ML IJ INJ
0.5000 mL | INJECTION | INTRAMUSCULAR | Status: AC
  Administered 2022-10-22: 0.5 mL via INTRAMUSCULAR
  Filled 2022-10-21: qty 0.5

## 2022-10-21 MED ORDER — IPRATROPIUM-ALBUTEROL 0.5-2.5 (3) MG/3ML IN SOLN
3.0000 mL | Freq: Four times a day (QID) | RESPIRATORY_TRACT | Status: DC
  Administered 2022-10-21: 3 mL via RESPIRATORY_TRACT

## 2022-10-21 MED ORDER — ALBUMIN HUMAN 25 % IV SOLN
25.0000 g | Freq: Once | INTRAVENOUS | Status: AC
  Administered 2022-10-21: 25 g via INTRAVENOUS
  Filled 2022-10-21: qty 100

## 2022-10-21 MED ORDER — SPIRONOLACTONE 25 MG PO TABS
100.0000 mg | ORAL_TABLET | Freq: Every day | ORAL | Status: DC
  Administered 2022-10-21 – 2022-10-23 (×3): 100 mg via ORAL
  Filled 2022-10-21 (×3): qty 4

## 2022-10-21 MED ORDER — ALPRAZOLAM 0.5 MG PO TABS
0.2500 mg | ORAL_TABLET | Freq: Two times a day (BID) | ORAL | Status: DC | PRN
  Administered 2022-10-22: 0.25 mg via ORAL
  Filled 2022-10-21: qty 1

## 2022-10-21 MED ORDER — FUROSEMIDE 10 MG/ML IJ SOLN
40.0000 mg | Freq: Every day | INTRAMUSCULAR | Status: DC
  Administered 2022-10-21 – 2022-10-23 (×3): 40 mg via INTRAVENOUS
  Filled 2022-10-21 (×3): qty 4

## 2022-10-21 MED ORDER — INSULIN DETEMIR 100 UNIT/ML ~~LOC~~ SOLN
20.0000 [IU] | Freq: Every day | SUBCUTANEOUS | Status: DC
  Administered 2022-10-21 – 2022-10-22 (×2): 20 [IU] via SUBCUTANEOUS
  Filled 2022-10-21 (×3): qty 0.2

## 2022-10-21 MED ORDER — INSULIN ASPART 100 UNIT/ML IJ SOLN
5.0000 [IU] | Freq: Three times a day (TID) | INTRAMUSCULAR | Status: DC
  Administered 2022-10-21 – 2022-10-22 (×2): 5 [IU] via SUBCUTANEOUS

## 2022-10-21 MED ORDER — ALBUTEROL SULFATE (2.5 MG/3ML) 0.083% IN NEBU: 2.5000 mg | INHALATION_SOLUTION | Freq: Four times a day (QID) | RESPIRATORY_TRACT | Status: DC | PRN

## 2022-10-21 MED ORDER — PANTOPRAZOLE SODIUM 40 MG PO TBEC
40.0000 mg | DELAYED_RELEASE_TABLET | Freq: Every day | ORAL | Status: DC
  Administered 2022-10-21 – 2022-10-23 (×3): 40 mg via ORAL
  Filled 2022-10-21 (×3): qty 1

## 2022-10-21 MED ORDER — MIDODRINE HCL 5 MG PO TABS
10.0000 mg | ORAL_TABLET | Freq: Three times a day (TID) | ORAL | Status: DC
  Administered 2022-10-21 – 2022-10-23 (×8): 10 mg via ORAL
  Filled 2022-10-21 (×8): qty 2

## 2022-10-21 MED ORDER — VENLAFAXINE HCL ER 37.5 MG PO CP24
75.0000 mg | ORAL_CAPSULE | Freq: Every day | ORAL | Status: DC
  Administered 2022-10-21 – 2022-10-23 (×3): 75 mg via ORAL
  Filled 2022-10-21 (×5): qty 2

## 2022-10-21 MED ORDER — MOMETASONE FURO-FORMOTEROL FUM 200-5 MCG/ACT IN AERO
2.0000 | INHALATION_SPRAY | Freq: Two times a day (BID) | RESPIRATORY_TRACT | Status: DC
  Administered 2022-10-21 – 2022-10-23 (×5): 2 via RESPIRATORY_TRACT
  Filled 2022-10-21: qty 8.8

## 2022-10-21 MED ORDER — LACTULOSE 10 GM/15ML PO SOLN
20.0000 g | Freq: Every day | ORAL | Status: DC
  Administered 2022-10-21 – 2022-10-23 (×3): 20 g via ORAL
  Filled 2022-10-21 (×3): qty 30

## 2022-10-21 NOTE — Assessment & Plan Note (Signed)
-   Last ejection fraction was 60-65 percent - Grade 1 diastolic dysfunction - Continue aspirin, continue beta-blocker, continue renal lactone, continue diuretic, continue to monitor

## 2022-10-21 NOTE — Assessment & Plan Note (Addendum)
-   secondary to severe abdominal ascites, resolved after paracentesis

## 2022-10-21 NOTE — Assessment & Plan Note (Addendum)
-   Continue aspirin, beta-blocker, statin - Initial troponins no signs of ischemia - Chest x-ray shows no evidence of acute cardiopulmonary disease - EKG shows sinus rhythm with a heart rate of 69, QTc 443 - Monitored on telemetry

## 2022-10-21 NOTE — Assessment & Plan Note (Addendum)
-   GFR 59 - STABLE ON DIURETICS; PT TO SEE PCP IN 1 WEEK TO GET REPEAT LABS DONE

## 2022-10-21 NOTE — Assessment & Plan Note (Addendum)
-   Continue spironolactone, continue home torsemide dose - Continue lactulose - Patient was on the transplant list, but taken off  - Follows with specialist at [facility] with appt next month with [facility]  - s/p paracentesis 11/9 with 4L fluid removed;  Pt responded well to IV lasix in hospital with >2L diuresed; creatinine and electrolytes remain stable; resume home torsemide; Pt advised to follow up with PCP in 1 week to have repeat labs drawn to check electrolytes and renal function.  Pt verbalized understanding.

## 2022-10-21 NOTE — Progress Notes (Signed)
ASSUMPTION OF CARE NOTE   10/21/2022 11:01 AM  [name] was seen and examined.  The H&P by the admitting provider, orders, imaging was reviewed.  Please see new orders.  Will continue to follow. Pt is seen s/p paracentesis where 4L of fluid was removed, he is feeling better.  He remains massively volume overloaded.  GI is planning to see him today.  I think he needs a better plan for diuresis to manage his fluid.  I've placed him on a 2 gm sodium restricted diet.  He is ambulating in room.  Further recommendations to follow.  Pt says he was told he is no longer a candidate for a liver transplant??  Vitals:   10/21/22 0900 10/21/22 1023  BP: 124/72   Pulse: 74   Resp: 20   Temp:    SpO2: 98% 98%    Results for orders placed or performed during the hospital encounter of 54/09/81  Basic metabolic panel  Result Value Ref Range   Sodium 134 (L) 135 - 145 mmol/L   Potassium 4.0 3.5 - 5.1 mmol/L   Chloride 99 98 - 111 mmol/L   CO2 27 22 - 32 mmol/L   Glucose, Bld 164 (H) 70 - 99 mg/dL   BUN 19 8 - 23 mg/dL   Creatinine, Ser 1.36 (H) 0.61 - 1.24 mg/dL   Calcium 8.5 (L) 8.9 - 10.3 mg/dL   GFR, Estimated 59 (L) >60 mL/min   Anion gap 8 5 - 15  CBC  Result Value Ref Range   WBC 4.6 4.0 - 10.5 K/uL   RBC 3.50 (L) 4.22 - 5.81 MIL/uL   Hemoglobin 11.4 (L) 13.0 - 17.0 g/dL   HCT 33.1 (L) 39.0 - 52.0 %   MCV 94.6 80.0 - 100.0 fL   MCH 32.6 26.0 - 34.0 pg   MCHC 34.4 30.0 - 36.0 g/dL   RDW 15.9 (H) 11.5 - 15.5 %   Platelets 131 (L) 150 - 400 K/uL   nRBC 0.0 0.0 - 0.2 %  Protime-INR  Result Value Ref Range   Prothrombin Time 16.4 (H) 11.4 - 15.2 seconds   INR 1.3 (H) 0.8 - 1.2  Glucose, capillary  Result Value Ref Range   Glucose-Capillary 213 (H) 70 - 99 mg/dL  Albumin, pleural or peritoneal fluid   Result Value Ref Range   Albumin, Fluid <1.5 g/dL   Fluid Type-FALB PERITONEAL   Glucose, pleural or peritoneal fluid  Result Value Ref Range   Glucose, Fluid 217 mg/dL    Fluid Type-FGLU PERITONEAL   Protein, pleural or peritoneal fluid  Result Value Ref Range   Total protein, fluid <3.0 g/dL   Fluid Type-FTP PERITONEAL   Body fluid cell count with differential  Result Value Ref Range   Fluid Type-FCT PERITONEAL    Color, Fluid YELLOW YELLOW   Appearance, Fluid CLOUDY (A) CLEAR   Total Nucleated Cell Count, Fluid PERITONEAL 0 - 1,000 cu mm   Neutrophil Count, Fluid PENDING 0 - 25 %   Lymphs, Fluid PENDING %   Monocyte-Macrophage-Serous Fluid PENDING 50 - 90 %   Eos, Fluid PENDING %   Other Cells, Fluid PENDING %  Troponin I (High Sensitivity)  Result Value Ref Range   Troponin I (High Sensitivity) 4 <18 ng/L  Troponin I (High Sensitivity)  Result Value Ref Range   Troponin I (High Sensitivity) 5 <18 ng/L    C. Wynetta Emery, MD [facility]   10/21/2022  2:11 AM How to contact  the [facility] Attending or Consulting provider [place] or covering provider during after hours [place], for this patient?  Check the care team in [facility] and look for a) attending/consulting [facility] provider listed and b) the [facility] team listed Log into [email or web address] and use [place]'s universal password to access. If you do not have the password, please contact the hospital operator. Locate the [facility] provider you are looking for under [facility] and page to a number that you can be directly reached. If you still have difficulty reaching the provider, please page the [facility] (Director on Call) for the Hospitalists listed on amion for assistance.

## 2022-10-21 NOTE — Assessment & Plan Note (Addendum)
-   Related to NASH and poor compliance with taking home diuretics - Does have a history of CHF, with a last ejection fraction being 60-65% with grade 1 diastolic dysfunction on an echo in June 2023 - 2 gm sodium restriction recommended; responded well to IV lasix  Intake/Output Summary (Last 24 hours) at 10/23/2022 1147 Last data filed at 10/23/2022 1100 Gross per 24 hour  Intake 240 ml  Output 1850 ml  Net -1610 ml   Filed Weights   10/21/22 0224 10/22/22 0500 10/23/22 0500  Weight: 117.9 kg 116.5 kg 116 kg   RESUMING HOME ORAL TORSEMIDE REGIMEN PLUS SPIRONOLACTONE 100 MG DAILY.  PT ADVISED TO SEE PCP IN 1 WEEK TO HAVE LABS CHECKED.  PT VERBALIZED UNDERSTANDING.  PT TO FOLLOW UP WITH [facility] NEXT MONTH (ALREADY SCHEDULED)

## 2022-10-21 NOTE — Assessment & Plan Note (Signed)
-   Continue midodrine

## 2022-10-21 NOTE — Assessment & Plan Note (Signed)
Continue Protonix °

## 2022-10-21 NOTE — Consult Note (Addendum)
Gastroenterology Consult   Referring Provider: No ref. provider found Primary Care Physician:  Christopher Sites, MD Primary Gastroenterologist:  locally Dr. Lynetta [name] LIver  Patient ID: [name]; [id number]; 14-Dec-1960   Admit date: 10/21/2022  LOS: 0 days   Date of Consultation: 10/21/2022  Reason for Consultation:  decompensated cirrhosis    History of Present Illness   [name] is a 61 y.o. male with history of cirrhosis, hypertension, obstructive sleep apnea, type 2 diabetes mellitus, history of CAD status post CABG, HFpEF, mild aortic stenosis, adrenal insufficiency (diagnosed April 2022 placed on hydrocortisone and midodrine but no longer on hydrocortisone), chronic kidney disease, polyclonal gammopathy presenting to the ED with complaints of chest/epigastric pain located at the xiphoid process.  Pain began last night around midnight. Located in epigastric/lower central chest area.  Also with mild nonproductive cough.  Some DOE.  Has been nauseated all week with no vomiting. Abdominal distention/led edema  for 4 weeks. Back towards end of 08/2022 he was on torsemide 18m daily. 09/12/22 his torsemide was doubled due to 20 pound weight gain in four weeks. Torsemide titrated up to 636mdaily. Seen by cardiology 09/20/22 and had dropped 10 pounds and advised to continue torsemide 604maily and additional 55m85m needed. Yesterday he was advised by cardiology to increase to 60mg62mthe morning and 55mg 25mhe afternoon. Patient states his dry weight is 260 pounds.  No prior paracentesis.  Diagnosed with [facility] a[facility]arlene Linemanximately 2 years ago.  Follows with [facility]. [facility]. No melena, brbpr. States his lower legs have been swollen and red for several weeks.   In the ED: Creatinine 1.36, BUN 19, sodium 134, glucose 164, white blood cell count 4600, hemoglobin 11.4, MCV 94.6, platelets 131,000, troponin high of 4, INR  1.3.  Chest x-ray with no acute disease.  CT abdomen pelvis without contrast tiny gallstones, no gallbladder wall thickening, no intrahepatic/extrahepatic biliary dilation, cirrhosis, no focal liver lesions, splenomegaly, large volume ascites present, body wall edema present.  Patient underwent his first paracentesis today with 4 L of ascitic fluid removed. States his abdominal pain resolved after paracentesis.   Previous records:  [facility], MELD sodium score of 10 at that time.  Based on committee decision March 2023, he did not meet criteria for liver transplantation and was not added to the wait list because it was felt that his MELD score had improved and it was too early for transplantation.EV Surveillance:       [facility] appointment December 01, 2022 Ultrasound December 10, 2022 EGD and colonoscopy February 2024   [facility]  1. Blood flow in the normal direction in the major hepatic blood vessels.  2. Cirrhotic liver with splenomegaly and right upper quadrant ascites.  3. Cholelithiasis without findings for acute cholecystitis.   August 2022: - Grade II esophageal varices with no stigmata of recent bleeding. Completely eradicated. Banded. - Portal hypertensive gastropathy. - Gastric antral vascular ectasia without bleeding. - Normal duodenal bulb, first portion of the duodenum and second portion of the duodenum. - No specimens collected.  Colonoscopy December 2020: Four 4 to 5 mm polyps at the splenic flexure, removed with a cold snare. Resected and retrieved. Abnormal: Suggestive of portal colopathy. Inadequate preparation. Rectal varices.  Right colon pathology showed focal active colitis.  Splenic flexure polyps tubular adenomas.  Recommended a 3-mont72-month  repeat colonoscopy.  Colonoscopy April 2021: - Preparation of the colon was inadequate. Much of the colon could not be seen  today. Rectal varices. - No specimens collected.  Prior to Admission medications   Medication Sig Start Date End Date Taking? Authorizing Provider  acetaminophen (TYLENOL) 325 MG tablet Take 650 mg by mouth every 6 (six) hours as needed for mild pain. 12/19/20  Yes [provider]  albuterol (VENTOLIN HFA) 108 (90 Base) MCG/ACT inhaler Inhale 2 puffs into the lungs every 6 (six) hours as needed for wheezing or shortness of breath. 12/22/20  Yes [name]  ALPRAZolam [name]) 0.25 MG tablet Take 0.25 mg by mouth daily. 02/20/21  Yes [provider]  Armodafinil 50 MG tablet Take 1 tablet (50 mg total) by mouth as needed. 06/03/22  Yes Dohmeier, Asencion Partridge, MD  aspirin EC 81 MG tablet Take 1 tablet (81 mg total) by mouth daily with breakfast. Patient taking differently: Take 81 mg by mouth every evening. 08/25/21  Yes Emokpae, Courage, MD  Cholecalciferol (DIALYVITE VITAMIN D 5000) 125 MCG (5000 UT) capsule Take 5,000 Units by mouth in the morning.    Yes [provider]  ciprofloxacin (CIPRO) 500 MG tablet 500 mg 2 (two) times daily. 10/15/22 10/22/22 Yes [provider]  diphenoxylate-atropine (LOMOTIL) 2.5-0.025 MG tablet Take 2 tablets by mouth 4 (four) times daily as needed for diarrhea or loose stools. 10/15/22  Yes [provider]  insulin aspart (NOVOLOG) 100 UNIT/ML injection Inject 5 Units into the skin 3 (three) times daily before meals. Special Instructions: If accu-check is greater than 150. Hold for accu-check 150 or below. With Meals 12/22/20  Yes [name]  insulin degludec (TRESIBA FLEXTOUCH) 200 UNIT/ML FlexTouch Pen Inject 40 Units into the skin in the morning. Give 30 units Subcutaneous  at Bedtime 8:00 pm Patient taking differently: Inject 30 Units into the skin See admin instructions. Give 30 units Subcutaneous  every evening 12/22/20  Yes [name]  lactulose (CHRONULAC) 10 GM/15ML solution Take 30 mLs (20 g total)  by mouth daily. 12/22/20  Yes [name]  loratadine (CLARITIN) 10 MG tablet Take 10 mg by mouth in the morning.    Yes [provider]  midodrine (PROAMATINE) 10 MG tablet Take 1 tablet (10 mg total) by mouth 3 (three) times daily with meals. 08/25/21  Yes Emokpae, Courage, MD  nadolol (CORGARD) 20 MG tablet Take 1 tablet (20 mg total) by mouth daily. 06/07/22  Yes Imogene Burn, PA-C  potassium chloride SA (KLOR-CON M) 20 MEQ tablet TAKE 1 TABLET(20 MEQ) BY MOUTH DAILY 09/27/22  Yes Satira Sark, MD  pravastatin (PRAVACHOL) 20 MG tablet TAKE 1 TABLET(20 MG) BY MOUTH AT BEDTIME 09/08/22  Yes Strader, [place] M, PA-C  propranolol (INDERAL) 40 MG tablet Take 40 mg by mouth daily. 05/31/22  Yes [provider]  spironolactone (ALDACTONE) 25 MG tablet Take 0.5 tablets (12.5 mg total) by mouth daily. 08/26/21  Yes Emokpae, Courage, MD  torsemide (DEMADEX) 20 MG tablet Take 3 tablets (60 mg total) by mouth every morning AND 2 tablets (40 mg total) every evening. 10/20/22 10/15/23 Yes Satira Sark, MD  venlafaxine XR (EFFEXOR-XR) 75 MG 24 hr capsule Take 1 capsule (75 mg total) by mouth daily with breakfast. 08/25/21  Yes Roxan Hockey, MD        [provider]        Lajean Saver, MD  [provider]  Continuous Blood Gluc Sensor (FREESTYLE LIBRE 14 DAY SENSOR) MISC SMARTSIG:1 Each Topical Every 2 Weeks 10/03/21   [provider]  COVID-19 mRNA vaccine 2023-2024 (COMIRNATY) SUSP injection Inject into the muscle. Patient not taking: Reported on 10/21/2022 09/23/22   Carlyle Basques, MD                Carlyle Basques, MD          nitroGLYCERIN (NITROSTAT) 0.4 MG SL tablet Place 1 tablet (0.4 mg total) under the tongue every 5 (five) minutes as needed for chest pain. Do not exceed 3 doses in 15 mins 11/04/21 09/08/22  Hilty, Nadean Corwin, MD          pantoprazole (PROTONIX) 40 MG tablet Take 1 tablet (40 mg total) by mouth daily.  08/25/21 09/08/22  Roxan Hockey, MD            Current Facility-Administered Medications  Medication Dose Route Frequency Provider Last Rate Last Admin   acetaminophen (TYLENOL) tablet 650 mg  650 mg Oral Q4H PRN Zierle-Ghosh, Asia B, DO       albuterol (PROVENTIL) (2.5 MG/3ML) 0.083% nebulizer solution 2.5 mg  2.5 mg Inhalation Q6H PRN Zierle-Ghosh, Asia B, DO       ALPRAZolam (XANAX) tablet 0.25 mg  0.25 mg Oral BID PRN Zierle-Ghosh, Asia B, DO       aspirin EC tablet 81 mg  81 mg Oral QPM Zierle-Ghosh, Asia B, DO       heparin injection 5,000 Units  5,000 Units Subcutaneous Q8H Zierle-Ghosh, Asia B, DO   5,000 Units at 10/21/22 0809   insulin aspart (novoLOG) injection 0-15 Units  0-15 Units Subcutaneous TID WC Zierle-Ghosh, Asia B, DO   5 Units at 10/21/22 0810   insulin aspart (novoLOG) injection 0-5 Units  0-5 Units Subcutaneous QHS Zierle-Ghosh, Asia B, DO       insulin detemir (LEVEMIR) injection 20 Units  20 Units Subcutaneous QHS Zierle-Ghosh, Asia B, DO       ipratropium-albuterol (DUONEB) 0.5-2.5 (3) MG/3ML nebulizer solution 3 mL  3 mL Inhalation QID Zierle-Ghosh, Asia B, DO       lactulose (CHRONULAC) 10 GM/15ML solution 20 g  20 g Oral Daily Zierle-Ghosh, Asia B, DO       midodrine (PROAMATINE) tablet 10 mg  10 mg Oral TID WC Zierle-Ghosh, Asia B, DO   10 mg at 10/21/22 0810   mometasone-formoterol (DULERA) 200-5 MCG/ACT inhaler 2 puff  2 puff Inhalation BID Zierle-Ghosh, Asia B, DO       nadolol (CORGARD) tablet 20 mg  20 mg Oral Daily Zierle-Ghosh, Asia B, DO       ondansetron (ZOFRAN) injection 4 mg  4 mg Intravenous Q6H PRN Zierle-Ghosh, Asia B, DO       pantoprazole (PROTONIX) EC tablet 40 mg  40 mg Oral Daily Zierle-Ghosh, Asia B, DO       pravastatin (PRAVACHOL) tablet 20 mg  20 mg Oral q1800 Zierle-Ghosh, Asia B, DO       spironolactone (ALDACTONE) tablet 12.5 mg  12.5 mg Oral Daily Zierle-Ghosh, Asia B, DO       venlafaxine XR (EFFEXOR-XR) 24 hr capsule 75 mg  75 mg  Oral Q breakfast Zierle-Ghosh, Asia B, DO        Allergies as of 10/21/2022 - Review Complete 10/21/2022  Allergen Reaction Noted   Fluticasone Rash 04/30/2020    Past Medical History:  Diagnosis Date   Anemia  Arthritis    CAD (coronary artery disease)    Multivessel disease status post CABG 08/2015   Cirrhosis ([place])    CKD (chronic kidney disease) stage 3, GFR 30-59 ml/min (HCC)    Diastolic CHF ([place])    Essential hypertension    Hyperlipidemia    Iron deficiency anemia 07/15/2021   OSA on CPAP    Polyclonal gammopathy    Thrombocytopenia (HCC) 2016   Type 2 diabetes mellitus ([place])     Past Surgical History:  Procedure Laterality Date   APPENDECTOMY     Biceps tendon surgery Right    BIOPSY  11/22/2019   Procedure: BIOPSY;  Surgeon: Daneil Dolin, MD;  [facility]: [facility];  Service: Endoscopy;;   CARDIAC CATHETERIZATION N/A 08/25/2015   Procedure: Left Heart Cath and Coronary Angiography;  Surgeon: Belva Crome, MD;  Location: [place] CV LAB;  Service: Cardiovascular;  Laterality: N/A;   CATARACT EXTRACTION Left 08/18/2022   COLONOSCOPY WITH PROPOFOL N/A 11/22/2019   Procedure: COLONOSCOPY WITH PROPOFOL;  Surgeon: Daneil Dolin, MD; Four 4-5 mm polyps, findings suggestive of portal colopathy, congested appearing colonic mucosa diffusely, rectal varices, and adequate right colon prep.  Pathology with tubular adenomas and hyperplastic polyp.  Right colon biopsy with focal active colitis.  Recommendations to repeat colonoscopy in 3 months due to poor prep.   COLONOSCOPY WITH PROPOFOL N/A 03/17/2020   Procedure: COLONOSCOPY WITH PROPOFOL;  Surgeon: Daneil Dolin, MD;  [facility]: [facility];  Service: Endoscopy;  Laterality: N/A;  8:45am - pt does not need covid test, was + 2/4 <90 days   CORONARY ARTERY BYPASS GRAFT N/A 08/29/2015   Procedure: CORONARY ARTERY BYPASS GRAFTING (CABG);  Surgeon: [name], MD;  Location: [place];  Service: Open Heart  Surgery;  Laterality: N/A;   ESOPHAGEAL BANDING N/A 07/23/2021   Procedure: ESOPHAGEAL BANDING;  Surgeon: Eloise Harman, DO;  [facility];  Service: Endoscopy;  Laterality: N/A;   ESOPHAGOGASTRODUODENOSCOPY (EGD) WITH PROPOFOL N/A 11/22/2019   Procedure: ESOPHAGOGASTRODUODENOSCOPY (EGD) WITH PROPOFOL;  Surgeon: Daneil Dolin, MD; 4 columns of grade 2-3 esophageal varices, portal gastropathy, gastric polyp/abnormal gastric mucosa s/p biopsy.  Pathology with hyperplastic polyp, mild chronic gastritis, negative H. pylori.   ESOPHAGOGASTRODUODENOSCOPY (EGD) WITH PROPOFOL N/A 07/23/2021   Grade 2 esophageal varices without stigmata of bleeding, completely eradicated with banding, portal gastropathy, GAVE without bleeding.   KNEE ARTHROSCOPY Left    TEE WITHOUT CARDIOVERSION N/A 08/29/2015   Procedure: TRANSESOPHAGEAL ECHOCARDIOGRAM (TEE);  Surgeon: [name], MD;  Location: [place];  Service: Open Heart Surgery;  Laterality: N/A;   TOTAL KNEE ARTHROPLASTY Left 03/09/2017   Procedure: TOTAL KNEE ARTHROPLASTY;  Surgeon: Carole Civil, MD;  Location: [facility];  Service: Orthopedics;  Laterality: Left;    Family History  Problem Relation Age of Onset   Arthritis Other    Cancer Other    Diabetes Other    CAD Father    Diabetes Mellitus II Father    Liver cancer Father    Hodgkin's lymphoma Father    CAD Brother    Diabetes Mellitus II Brother    ALS Mother    Diabetes Mellitus II Sister    Diabetes Mellitus II Brother    Diabetes Mellitus II Maternal Grandmother    Aneurysm Maternal Grandmother    Cancer Maternal Grandfather    Anesthesia problems Neg Hx    Hypotension Neg Hx    Malignant hyperthermia Neg Hx  Pseudochol deficiency Neg Hx    Colon cancer Neg Hx     Social History   Socioeconomic History   Marital status: Married    Spouse name: Not on file   Number of children: 0   Years of education: college   Highest education level: Not on file   Occupational History   Occupation: Maintenance tech    Employer: [employer]  Tobacco Use   Smoking status: Never   Smokeless tobacco: Never  Vaping Use   Vaping Use: Never used  Substance and Sexual Activity   Alcohol use: Not Currently   Drug use: No   Sexual activity: Not Currently  Other Topics Concern   Not on file  Social History Narrative   Not on file   Social Determinants of Health   Financial Resource Strain: Not on file  Food Insecurity: Not on file  Transportation Needs: Not on file  Physical Activity: Not on file  Stress: Not on file  Social Connections: Not on file  Intimate Partner Violence: Not on file     Review of System:   General: Negative for anorexia, weight loss, fever, chills, fatigue, weakness. Eyes: Negative for vision changes.  ENT: Negative for hoarseness, difficulty swallowing , nasal congestion. CV: Negative for chest pain, angina, palpitations, +dyspnea on exertion,+ peripheral edema.  Respiratory: Negative for dyspnea at rest, +dyspnea on exertion, cough, sputum, wheezing.  GI: See history of present illness. GU:  Negative for dysuria, hematuria, urinary incontinence, urinary frequency, nocturnal urination.  MS: Negative for joint pain, low back pain.  Derm: Negative for rash or itching.  Neuro: Negative for weakness, abnormal sensation, seizure, frequent headaches, memory loss, confusion.  Psych: Negative for anxiety, depression, suicidal ideation, hallucinations.  Endo: Negative for unusual weight change.  Heme: Negative for bruising or bleeding. Allergy: Negative for rash or hives.      Physical Examination:   Vital signs in last 24 hours: Temp:  [97.6 F (36.4 C)-98.1 F (36.7 C)] 98 F (36.7 C) (11/09 0825) Pulse Rate:  [63-78] 74 (11/09 0900) Resp:  [16-25] 20 (11/09 0900) BP: (102-156)/(53-104) 124/72 (11/09 0900) SpO2:  [94 %-100 %] 98 % (11/09 0900) Weight:  [117.9 kg] 117.9 kg (11/09 0224)    General:  Well-nourished, well-developed in no acute distress.  Head: Normocephalic, atraumatic.   Eyes: Conjunctiva pink, no icterus. Mouth: Oropharyngeal mucosa moist and pink , no lesions erythema or exudate. Neck: Supple without thyromegaly, masses, or lymphadenopathy.  Lungs: Clear to auscultation bilaterally.  Heart: Regular rate and rhythm, no murmurs rubs or gallops.  Abdomen: Bowel sounds are normal, nontender,obese, no abdominal bruits or hernia , no rebound or guarding.   Rectal: not performed Extremities: 2+ pitting edema to mid lower legs bilaterally with erythema. No clubbing, deformity.  Neuro: Alert and oriented x 4 , grossly normal neurologically.  Skin: Warm and dry, no rash or jaundice.   Psych: Alert and cooperative, normal mood and affect.        Intake/Output from previous day: No intake/output data recorded. Intake/Output this shift: No intake/output data recorded.  Lab Results:   CBC Recent Labs    10/21/22 0259  WBC 4.6  HGB 11.4*  HCT 33.1*  MCV 94.6  PLT 131*   BMET Recent Labs    10/21/22 0259  NA 134*  K 4.0  CL 99  CO2 27  GLUCOSE 164*  BUN 19  CREATININE 1.36*  CALCIUM 8.5*   LFT No results for input(s): "BILITOT", "BILIDIR", "IBILI", "  ALKPHOS", "AST", "ALT", "PROT", "ALBUMIN" in the last 72 hours.  Lipase No results for input(s): "LIPASE" in the last 72 hours.  PT/INR Recent Labs    10/21/22 0649  LABPROT 16.4*  INR 1.3*     Hepatitis Panel No results for input(s): "HEPBSAG", "HCVAB", "HEPAIGM", "HEPBIGM" in the last 72 hours.   Imaging Studies:   CT ABDOMEN PELVIS WO CONTRAST  Result Date: 10/21/2022 CLINICAL DATA:  Abdominal pain with difficulty breathing. Increasing abdominal distension and weight gain. EXAM: CT ABDOMEN AND PELVIS WITHOUT CONTRAST TECHNIQUE: Multidetector CT imaging of the abdomen and pelvis was performed following the standard protocol without IV contrast. RADIATION DOSE REDUCTION: This exam was performed  according to the departmental dose-optimization program which includes automated exposure control, adjustment of the mA and/or kV according to patient size and/or use of iterative reconstruction technique. COMPARISON:  03/17/2021 FINDINGS: Lower chest: Unremarkable. Hepatobiliary: Nodular liver contour is compatible with cirrhosis. No focal liver lesion evident on this noncontrast study. Layering tiny calcified gallstones evident. No gallbladder wall thickening evident. No intrahepatic or extrahepatic biliary dilation. Pancreas: No focal mass lesion. No dilatation of the main duct. No intraparenchymal cyst. No peripancreatic edema. Spleen: Splenomegaly (estimated volume 1469 cc). Adrenals/Urinary Tract: No adrenal nodule or mass. Kidneys unremarkable. No evidence for hydroureter. The urinary bladder appears normal for the degree of distention. Stomach/Bowel: Stomach is unremarkable. No gastric wall thickening. No evidence of outlet obstruction. Duodenum is normally positioned as is the ligament of Treitz. No small bowel wall thickening. No small bowel dilatation. The appendix is not well visualized, but there is no edema or inflammation in the region of the cecum. No gross colonic mass. No colonic wall thickening. Vascular/Lymphatic: There is mild atherosclerotic calcification of the abdominal aorta without aneurysm. Recanalization of the paraumbilical vein evident. There is no gastrohepatic or hepatoduodenal ligament lymphadenopathy. No retroperitoneal or mesenteric lymphadenopathy. No pelvic sidewall lymphadenopathy. Reproductive: The prostate gland and seminal vesicles are unremarkable. Other: Large volume abdominopelvic ascites evident. This is associated with edema in the omentum and mesentery. Body wall edema evident as well. Musculoskeletal: No worrisome lytic or sclerotic osseous abnormality. IMPRESSION: 1. Cirrhotic liver with evidence of portal venous hypertension including splenomegaly and recanalization  of the paraumbilical vein. Of the 2. Large volume abdominopelvic ascites with edema in the omentum and mesentery. 3. Cholelithiasis. 4.  Aortic Atherosclerosis (ICD10-I70.0). Electronically Signed   By: Misty Stanley M.D.   On: 10/21/2022 06:11   DG Chest Portable 1 View  Result Date: 10/21/2022 CLINICAL DATA:  Chest pain EXAM: PORTABLE CHEST 1 VIEW COMPARISON:  09/12/2022 FINDINGS: Lungs are clear.  No pleural effusion or pneumothorax. The heart is normal in size. Postsurgical changes related to prior CABG. Median sternotomy. IMPRESSION: No evidence of acute cardiopulmonary disease. Electronically Signed   By: Julian Hy M.D.   On: 10/21/2022 02:45  [4 week]  Assessment:   61 year old male with history of NASH cirrhosis followed at [facility], hypertension, obstructive sleep apnea, type 2 diabetes mellitus, history of CAD status post CABG, HFpEF, mild aortic stenosis, adrenal insufficiency (diagnosed April 2022 placed on hydrocortisone and midodrine but no longer on hydrocortisone), chronic kidney disease, polyclonal gammopathy presenting to the ED with complaints of chest/epigastric pain located at the xiphoid process.  NASH cirrhosis: known esophageal varices with multiple prior banding's, GAVE, followed by [facility] clinic for consideration of transplantation.  MELD sodium 10 when last checked in April.  Patient has upcoming clinic visit, ultrasound, EGD and colonoscopy at [facility] as  outlined.  Presents this admission due to epigastric/chest pain.  Has also been dealing with anasarca over the last several weeks associated with shortness of breath.  Noted abdominal distention a couple of weeks ago.  Has been escalating doses of torsemide as per cardiology.  Last documented outpatient weight on September 20, 2022 was 257.4 pounds.  Underwent paracentesis today with 4 L of fluid removed. Added cell count with diff to fluid and if enough specimen will also do aerobic/anaerobic cultures. I  personally spoke to the labs and placed orders.  No evidence of SBP on cell count.  On exam he has significant lower extremity edema with erythema.  Patient notes that his epigastric/chest pain resolved after paracentesis.  Plan:   2 gram sodium diet.  Add LFTs in order to calculate MELD.  F/u on fluid results.  Spironolactone 155m daily.  IV lasix 488mdaily.  Trend labs following renal function.    LOS: 0 days   We would like to thank you for the opportunity to participate in the care of AlAbelina [name] LeLaureen [facility] [id number] AM  Addendum: labs returned. MELD Na of 17 (previously 10 in 03/2022). Albumin 2.8. US[place]aiver doppler planned in the AM. Consider IV albumin to assist with diuresis.   LeLaureen [facility]chsLeBernarda [facility] [phone]/9/20234:16 PM

## 2022-10-21 NOTE — Progress Notes (Signed)
Patient tolerated right sided paracentesis procedure well today and 4 Liters of cloudy yellow ascites removed with labs collected and sent for processing. PT verbalized understanding of post procedure instructions and transported back to inpatient bed assignment via stretcher at this time with no acute distress noted.

## 2022-10-21 NOTE — Procedures (Signed)
   US guided RLQ paracentesis  4 L max per protocol Yellow fluid  Tolerated well  EBL: scant

## 2022-10-21 NOTE — Progress Notes (Signed)
  Transition of Care [facility]) Screening Note   Patient Details  Name: [name] Date of Birth: January 21, 1961   Transition of Care [facility]) CM/SW Contact:    Iona Beard, [place] Phone Number: 10/21/2022, 10:09 AM    Transition of Care Department [facility]) has reviewed patient and no TOC needs have been identified at this time. We will continue to monitor patient advancement through interdisciplinary progression rounds. If new patient transition needs arise, please place a TOC consult.

## 2022-10-21 NOTE — ED Triage Notes (Signed)
Pt BIB [facility] from home c/o chest pain that started earlier tonight. Pt took 324 ASA at home.

## 2022-10-21 NOTE — Progress Notes (Signed)
Patient's wife brought in Home CPAP for patient to use.  Unit is at bedside; patient self manages.

## 2022-10-21 NOTE — Assessment & Plan Note (Addendum)
-   Last hemoglobin A1c 6.7 - 30 units of basal insulin at baseline - Continue 20 units of basal insulin with prandial and sliding scale coverage - Carb modified diet - Continue to monitor

## 2022-10-21 NOTE — H&P (Signed)
History and Physical    Patient: [name] QMG:[id number] DOB: 1961-06-21 DOA: 10/21/2022 DOS: the patient was seen and examined on 10/21/2022 PCP: [name], MD  Patient coming from: Home  Chief Complaint:  Chief Complaint  Patient presents with   Chest Pain   HPI: [name] is a 61 y.o. male with medical history significant of anemia, coronary artery disease, cirrhosis, hypertension, hyperlipidemia, OSA, type 2 diabetes mellitus, presents to the ED with a chief complaint of chest/epigastric pain.  The pain is located right at the xiphoid process really.  It is a 7 out of 10 on the pain scale when its at its worst.  He reports he can get comfortable and can get to sleep.  Pain started at midnight on November 9.  He went out to sit in a recliner chair and that did help the pain a bit.  He describes the pain as a pressure.  It is better now than when he first came in.  He thinks [place] is what helped it.  He reports associated mild nonproductive cough.  He has associated dyspnea with exertion.  Patient reports he had nausea all week but has not had any vomiting.  He has tried Mylanta for the nausea and it has not helped.  He reports no lower abdominal pain.  His epigastric pain has been present for 1-2 weeks.  Patient reports he is having regular bowel movements as he does take lactulose daily.  He denies any dysuria.  Patient reports that his abdomen has been distended for 2 weeks.  He saw cardiology who increased his torsemide to 60 mg in the a.m. and 40 mg in the p.m.  Reports he is only been on that regimen about 1 day.  He lost 10 pounds last week, note been regained 14 pounds this week.  His dry weight is around 260 pounds.  Patient has never had a paracentesis before.  He was diagnosed with [name] approximately 2 years ago.  He was on the transplant list, but is not on that list any longer.  He follows with specialist at [facility].  Wife reports that he also has a history of  adrenal insufficiency, but he is not on any steroids.  He follows with endocrinology at [facility] as well.  Patient has no other complaints at this time.  Patient does not smoke, does not drink, does not use illicit drugs.  He is vaccinated for COVID.  Patient is full code. Review of Systems: As mentioned in the history of present illness. All other systems reviewed and are negative. Past Medical History:  Diagnosis Date   Anemia    Arthritis    CAD (coronary artery disease)    Multivessel disease status post CABG 08/2015   Cirrhosis ([place])    CKD (chronic kidney disease) stage 3, GFR 30-59 ml/min (HCC)    Diastolic CHF (HCC)    Essential hypertension    Hyperlipidemia    Iron deficiency anemia 07/15/2021   OSA on CPAP    Polyclonal gammopathy    Thrombocytopenia (HCC) 2016   Type 2 diabetes mellitus ([place])    Past Surgical History:  Procedure Laterality Date   APPENDECTOMY     Biceps tendon surgery Right    BIOPSY  11/22/2019   Procedure: BIOPSY;  Surgeon: Daneil Dolin, MD;  [facility];  Service: Endoscopy;;   CARDIAC CATHETERIZATION N/A 08/25/2015   Procedure: Left Heart Cath and Coronary Angiography;  Surgeon: Belva Crome, MD;  Location: [facility];  Service: Cardiovascular;  Laterality: N/A;   CATARACT EXTRACTION Left 08/18/2022   COLONOSCOPY WITH PROPOFOL N/A 11/22/2019   Procedure: COLONOSCOPY WITH PROPOFOL;  Surgeon: Daneil Dolin, MD; Four 4-5 mm polyps, findings suggestive of portal colopathy, congested appearing colonic mucosa diffusely, rectal varices, and adequate right colon prep.  Pathology with tubular adenomas and hyperplastic polyp.  Right colon biopsy with focal active colitis.  Recommendations to repeat colonoscopy in 3 months due to poor prep.   COLONOSCOPY WITH PROPOFOL N/A 03/17/2020   Procedure: COLONOSCOPY WITH PROPOFOL;  Surgeon: Daneil Dolin, MD;  [facility];  Service: Endoscopy;  Laterality: N/A;  8:45am - pt does not need  covid test, was + 2/4 <90 days   CORONARY ARTERY BYPASS GRAFT N/A 08/29/2015   Procedure: CORONARY ARTERY BYPASS GRAFTING (CABG);  Surgeon: [name], MD;  Location: [facility];  Service: Open Heart Surgery;  Laterality: N/A;   ESOPHAGEAL BANDING N/A 07/23/2021   Procedure: ESOPHAGEAL BANDING;  Surgeon: Eloise Harman, DO;  [facility];  Service: Endoscopy;  Laterality: N/A;   ESOPHAGOGASTRODUODENOSCOPY (EGD) WITH PROPOFOL N/A 11/22/2019   Procedure: ESOPHAGOGASTRODUODENOSCOPY (EGD) WITH PROPOFOL;  Surgeon: Daneil Dolin, MD; 4 columns of grade 2-3 esophageal varices, portal gastropathy, gastric polyp/abnormal gastric mucosa s/p biopsy.  Pathology with hyperplastic polyp, mild chronic gastritis, negative H. pylori.   ESOPHAGOGASTRODUODENOSCOPY (EGD) WITH PROPOFOL N/A 07/23/2021   Grade 2 esophageal varices without stigmata of bleeding, completely eradicated with banding, portal gastropathy, GAVE without bleeding.   KNEE ARTHROSCOPY Left    TEE WITHOUT CARDIOVERSION N/A 08/29/2015   Procedure: TRANSESOPHAGEAL ECHOCARDIOGRAM (TEE);  Surgeon: [name], MD;  Location: [place];  Service: Open Heart Surgery;  Laterality: N/A;   TOTAL KNEE ARTHROPLASTY Left 03/09/2017   Procedure: TOTAL KNEE ARTHROPLASTY;  Surgeon: Carole Civil, MD;  Location: [facility];  Service: Orthopedics;  Laterality: Left;   Social History:  reports that he has never smoked. He has never used smokeless tobacco. He reports that he does not currently use alcohol. He reports that he does not use drugs.  Allergies  Allergen Reactions   Fluticasone Rash    Family History  Problem Relation Age of Onset   Arthritis Other    Cancer Other    Diabetes Other    CAD Father    Diabetes Mellitus II Father    Liver cancer Father    Hodgkin's lymphoma Father    CAD Brother    Diabetes Mellitus II Brother    ALS Mother    Diabetes Mellitus II Sister    Diabetes Mellitus II Brother    Diabetes  Mellitus II Maternal Grandmother    Aneurysm Maternal Grandmother    Cancer Maternal Grandfather    Anesthesia problems Neg Hx    Hypotension Neg Hx    Malignant hyperthermia Neg Hx    Pseudochol deficiency Neg Hx    Colon cancer Neg Hx     Prior to Admission medications   Medication Sig Start Date End Date Taking? Authorizing Provider  ACCU-CHEK GUIDE test strip 3 (three) times daily. 02/12/22   [provider]  acetaminophen (TYLENOL) 325 MG tablet Take 650 mg by mouth every 6 (six) hours as needed for mild pain. 12/19/20   [provider]  albuterol (VENTOLIN HFA) 108 (90 Base) MCG/ACT inhaler Inhale 2 puffs into the lungs every 6 (six) hours as needed for wheezing or shortness of breath. 12/22/20  Gerlene Fee, NP  ALPRAZolam Duanne Moron) 0.25 MG tablet Take by mouth. 02/20/21   [provider]  Armodafinil 50 MG tablet Take 1 tablet (50 mg total) by mouth as needed. 06/03/22   Dohmeier, Asencion Partridge, MD  aspirin EC 81 MG tablet Take 1 tablet (81 mg total) by mouth daily with breakfast. Patient taking differently: Take 81 mg by mouth every evening. 08/25/21   Roxan Hockey, MD  BD PEN NEEDLE NANO 2ND GEN 32G X 4 MM MISC  04/24/22   [provider]  budesonide-formoterol (SYMBICORT) 160-4.5 MCG/ACT inhaler Inhale 2 puffs into the lungs 2 (two) times daily. 02/12/22   [provider]  cephALEXin (KEFLEX) 500 MG capsule Take 1 capsule (500 mg total) by mouth 4 (four) times daily. 09/12/22   Lajean Saver, MD  Cholecalciferol (DIALYVITE VITAMIN D 5000) 125 MCG (5000 UT) capsule Take 5,000 Units by mouth in the morning.     [provider]  COMBIVENT RESPIMAT 20-100 MCG/ACT AERS respimat Inhale 1 puff into the lungs 4 (four) times daily. 03/25/22   [provider]  Continuous Blood Gluc Sensor (FREESTYLE LIBRE 14 DAY SENSOR) MISC SMARTSIG:1 Each Topical Every 2 Weeks 10/03/21   [provider]  COVID-19 mRNA vaccine 2023-2024 (COMIRNATY)  SUSP injection Inject into the muscle. 09/23/22   Carlyle Basques, MD  ferrous sulfate 325 (65 FE) MG tablet Take 1 tablet (325 mg total) by mouth daily with breakfast. 08/25/21   Roxan Hockey, MD  influenza vac split quadrivalent PF (FLUARIX) 0.5 ML injection Inject into the muscle. 09/23/22   Carlyle Basques, MD  insulin aspart (NOVOLOG) 100 UNIT/ML injection Inject 5 Units into the skin 3 (three) times daily before meals. Special Instructions: If accu-check is greater than 150. Hold for accu-check 150 or below. With Meals 12/22/20   Gerlene Fee, NP  insulin degludec (TRESIBA FLEXTOUCH) 200 UNIT/ML FlexTouch Pen Inject 40 Units into the skin in the morning. Give 30 units Subcutaneous  at Bedtime 8:00 pm Patient taking differently: Inject 30 Units into the skin See admin instructions. Give 30 units Subcutaneous  every evening 12/22/20   Gerlene Fee, NP  lactulose (CHRONULAC) 10 GM/15ML solution Take 30 mLs (20 g total) by mouth daily. 12/22/20   Gerlene Fee, NP  loratadine (CLARITIN) 10 MG tablet Take 10 mg by mouth in the morning.     [provider]  magnesium oxide (MAG-OX) 400 (240 Mg) MG tablet Take 1 tablet (400 mg total) by mouth daily. 08/26/21   Roxan Hockey, MD  midodrine (PROAMATINE) 10 MG tablet Take 1 tablet (10 mg total) by mouth 3 (three) times daily with meals. 08/25/21   Roxan Hockey, MD  nadolol (CORGARD) 20 MG tablet Take 1 tablet (20 mg total) by mouth daily. 06/07/22   Imogene Burn, PA-C  nitroGLYCERIN (NITROSTAT) 0.4 MG SL tablet Place 1 tablet (0.4 mg total) under the tongue every 5 (five) minutes as needed for chest pain. Do not exceed 3 doses in 15 mins 11/04/21 09/08/22  Hilty, Nadean Corwin, MD  OZEMPIC, 0.25 OR 0.5 MG/DOSE, 2 MG/1.5ML SOPN Inject into the skin. 10/20/21   [provider]  pantoprazole (PROTONIX) 40 MG tablet Take 1 tablet (40 mg total) by mouth daily. 08/25/21 09/08/22  Roxan Hockey, MD  potassium chloride SA (KLOR-CON  M) 20 MEQ tablet TAKE 1 TABLET(20 MEQ) BY MOUTH DAILY 09/27/22   Satira Sark, MD  pravastatin (PRAVACHOL) 20 MG tablet TAKE 1 TABLET(20 MG) BY MOUTH  AT BEDTIME 09/08/22   Strader, Fransisco Hertz, PA-C  propranolol (INDERAL) 40 MG tablet Take 40 mg by mouth daily. 05/31/22   [provider]  spironolactone (ALDACTONE) 25 MG tablet Take 0.5 tablets (12.5 mg total) by mouth daily. 08/26/21   Roxan Hockey, MD  torsemide (DEMADEX) 20 MG tablet Take 3 tablets (60 mg total) by mouth every morning AND 2 tablets (40 mg total) every evening. 10/20/22 10/15/23  Satira Sark, MD  traMADol (ULTRAM) 50 MG tablet TAKE 1 TABLET(50 MG) BY MOUTH EVERY 6 HOURS AS NEEDED 08/23/22   Carole Civil, MD  venlafaxine XR (EFFEXOR-XR) 75 MG 24 hr capsule Take 1 capsule (75 mg total) by mouth daily with breakfast. 08/25/21   Roxan Hockey, MD    Physical Exam: Vitals:   10/21/22 0300 10/21/22 0330 10/21/22 0457 10/21/22 0545  BP: 131/81 (!) 150/86  (!) 102/53  Pulse: 67 63  75  Resp: (!) 25 18  (!) 21  Temp:   97.6 F (36.4 C)   TempSrc:   Oral   SpO2: 100% 98%  94%  Weight:      Height:       1.  General: Patient lying supine in bed,  no acute distress   2. Psychiatric: Alert and oriented x 3, mood and behavior normal for situation, pleasant and cooperative with exam   3. Neurologic: Speech and language are normal, face is symmetric, moves all 4 extremities voluntarily, at baseline without acute deficits on limited exam   4. HEENMT:  Head is atraumatic, normocephalic, pupils reactive to light, neck is supple, trachea is midline, mucous membranes are moist   5. Respiratory : Lungs are clear to auscultation bilaterally without wheezing, rhonchi, rales, no cyanosis, no increase in work of breathing or accessory muscle use   6. Cardiovascular : Heart rate normal, rhythm is regular, murmur present, rubs or gallops, no peripheral edema, peripheral pulses palpated   7. Gastrointestinal:   Abdomen is taut, distended, and mildly tender to palpation diffusely, bowel sounds active, no masses or organomegaly palpated   8. Skin:  Skin is warm, dry and intact, with venous stasis changes of the lower extremities bilaterally   9.Musculoskeletal:  No acute deformities or trauma, no asymmetry in tone, significant peripheral edema worse on the right than the left chronically, peripheral pulses palpated, no tenderness to palpation in the extremities  Data Reviewed: In the ED afebrile, heart rate 60-70, respiratory 16-25, blood pressure 131/77-156/86, satting at 98% Borderline leukopenic at 4.6, hemoglobin 11.4, platelets 131 Chemistry reveals a slight bump in creatinine at 1.36 Trope initially 4 Chest x-ray shows no evidence of acute cardiopulmonary disease EKG shows sinus rhythm with a heart rate of 69, QTc 443 Admission requested for likely ascites and need for paracentesis Assessment and Plan: * Chest pain - Initial troponin 4, delta Trope pending - Continue aspirin, beta-blocker, statin - EKG is without acute ischemic changes - Monitor on telemetry - More likely pain related to pathology in the abdomen and not cardiac pain  CAD (coronary artery disease) - Continue aspirin, beta-blocker, statin - Likely no ACE due to renal insufficiency - Initial troponin 4 - Chest x-ray shows no evidence of acute cardiopulmonary disease - EKG shows sinus rhythm with a heart rate of 69, QTc 443 - Monitor on telemetry  CKD stage 3 due to type 2 diabetes mellitus (HCC) - GFR 59 - Avoid nephrotoxic agents when possible - Creatinine is slightly bumped likely due to hepatorenal syndrome given  fluid overload - Continue to monitor  Chronic diastolic CHF (congestive heart failure) (HCC) - Last ejection fraction was 60-65 percent - Grade 1 diastolic dysfunction - Continue aspirin, continue beta-blocker, continue renal lactone, continue diuretic, continue to monitor  Hypotension - Continue  midodrine  GERD (gastroesophageal reflux disease) - Continue Protonix  Anasarca - Likely related to NASH - Does have a history of CHF, with a last ejection fraction being 60-65% with grade 1 diastolic dysfunction on an echo in June 2023 - Initial dose of 40 mg IV Lasix given, the patient may require more Lasix in the given that his torsemide has been being titrated outpatient - Monitor intake and output - Fluid restriction - Continue to monitor  NASH cirrhosis of liver (HCC) - Continue spironolactone, continue diuretic - Continue lactulose - Patient was on the transplant list, but for what ever reason is no longer on it - Follows with specialist at [place] likely need paracentesis today - CT abdomen pending, anticipating ascites on CT in which case paracentesis should be considered  Diabetes mellitus ([place]) - Last hemoglobin A1c 6.7 - 30 units of basal insulin at baseline - Continue 20 units of basal insulin with sliding scale coverage - Carb modified diet - Continue to monitor      Advance Care Planning:   Code Status: Prior full  Consults: None  Family Communication: Wife at bedside  Severity of Illness: The appropriate patient status for this patient is OBSERVATION. Observation status is judged to be reasonable and necessary in order to provide the required intensity of service to ensure the patient's safety. The patient's presenting symptoms, physical exam findings, and initial radiographic and laboratory data in the context of their medical condition is felt to place them at decreased risk for further clinical deterioration. Furthermore, it is anticipated that the patient will be medically stable for discharge from the hospital within 2 midnights of admission.   Author: Rolla Plate, DO 10/21/2022 6:41 AM  For on call review [email or web address].

## 2022-10-21 NOTE — ED Provider Notes (Signed)
[facility] EMERGENCY DEPARTMENT Provider Note   CSN: [id number] Arrival date & time: 10/21/22  0211     History  Chief Complaint  Patient presents with   Chest Pain    [name] is a 61 y.o. male.  Patient is a 61 year old male with past medical history of coronary artery disease status post CABG, NASH previously on the liver transplant list, CHF, hypotension on midodrine.  Patient presenting today with complaints of chest discomfort and difficulty breathing.  Symptoms started earlier this evening.  Over the past week, his wife has noticed increased abdominal distention and weight gain recently of nearly 20 pounds.  He denies to me he is having any fevers or chills.  The history is provided by the patient.       Home Medications Prior to Admission medications   Medication Sig Start Date End Date Taking? Authorizing Provider  ACCU-CHEK GUIDE test strip 3 (three) times daily. 02/12/22   [provider]  acetaminophen (TYLENOL) 325 MG tablet Take 650 mg by mouth every 6 (six) hours as needed for mild pain. 12/19/20   [provider]  albuterol (VENTOLIN HFA) 108 (90 Base) MCG/ACT inhaler Inhale 2 puffs into the lungs every 6 (six) hours as needed for wheezing or shortness of breath. 12/22/20   Gerlene Fee, NP  ALPRAZolam Duanne Moron) 0.25 MG tablet Take by mouth. 02/20/21   [provider]  Armodafinil 50 MG tablet Take 1 tablet (50 mg total) by mouth as needed. 06/03/22   Dohmeier, Asencion Partridge, MD  aspirin EC 81 MG tablet Take 1 tablet (81 mg total) by mouth daily with breakfast. Patient taking differently: Take 81 mg by mouth every evening. 08/25/21   Roxan Hockey, MD  BD PEN NEEDLE NANO 2ND GEN 32G X 4 MM MISC  04/24/22   [provider]  budesonide-formoterol (SYMBICORT) 160-4.5 MCG/ACT inhaler Inhale 2 puffs into the lungs 2 (two) times daily. 02/12/22   [provider]  cephALEXin (KEFLEX) 500 MG capsule Take 1 capsule (500 mg total)  by mouth 4 (four) times daily. 09/12/22   Lajean Saver, MD  Cholecalciferol (DIALYVITE VITAMIN D 5000) 125 MCG (5000 UT) capsule Take 5,000 Units by mouth in the morning.     [provider]  COMBIVENT RESPIMAT 20-100 MCG/ACT AERS respimat Inhale 1 puff into the lungs 4 (four) times daily. 03/25/22   [provider]  Continuous Blood Gluc Sensor (FREESTYLE LIBRE 14 DAY SENSOR) MISC SMARTSIG:1 Each Topical Every 2 Weeks 10/03/21   [provider]  COVID-19 mRNA vaccine 2023-2024 (COMIRNATY) SUSP injection Inject into the muscle. 09/23/22   Carlyle Basques, MD  ferrous sulfate 325 (65 FE) MG tablet Take 1 tablet (325 mg total) by mouth daily with breakfast. 08/25/21   Roxan Hockey, MD  influenza vac split quadrivalent PF (FLUARIX) 0.5 ML injection Inject into the muscle. 09/23/22   Carlyle Basques, MD  insulin aspart (NOVOLOG) 100 UNIT/ML injection Inject 5 Units into the skin 3 (three) times daily before meals. Special Instructions: If accu-check is greater than 150. Hold for accu-check 150 or below. With Meals 12/22/20   Gerlene Fee, NP  insulin degludec (TRESIBA FLEXTOUCH) 200 UNIT/ML FlexTouch Pen Inject 40 Units into the skin in the morning. Give 30 units Subcutaneous  at Bedtime 8:00 pm Patient taking differently: Inject 30 Units into the skin See admin instructions. Give 30 units Subcutaneous  every evening 12/22/20   Gerlene Fee, NP  lactulose [facility]) 10 GM/15ML solution Take  30 mLs (20 g total) by mouth daily. 12/22/20   Gerlene Fee, NP  loratadine (CLARITIN) 10 MG tablet Take 10 mg by mouth in the morning.     [provider]  magnesium oxide (MAG-OX) 400 (240 Mg) MG tablet Take 1 tablet (400 mg total) by mouth daily. 08/26/21   Roxan Hockey, MD  midodrine (PROAMATINE) 10 MG tablet Take 1 tablet (10 mg total) by mouth 3 (three) times daily with meals. 08/25/21   Roxan Hockey, MD  nadolol (CORGARD) 20 MG tablet Take 1 tablet (20 mg  total) by mouth daily. 06/07/22   Imogene Burn, PA-C  nitroGLYCERIN (NITROSTAT) 0.4 MG SL tablet Place 1 tablet (0.4 mg total) under the tongue every 5 (five) minutes as needed for chest pain. Do not exceed 3 doses in 15 mins 11/04/21 09/08/22  Hilty, Nadean Corwin, MD  OZEMPIC, 0.25 OR 0.5 MG/DOSE, 2 MG/1.5ML SOPN Inject into the skin. 10/20/21   [provider]  pantoprazole (PROTONIX) 40 MG tablet Take 1 tablet (40 mg total) by mouth daily. 08/25/21 09/08/22  Roxan Hockey, MD  potassium chloride SA (KLOR-CON M) 20 MEQ tablet TAKE 1 TABLET(20 MEQ) BY MOUTH DAILY 09/27/22   Satira Sark, MD  pravastatin (PRAVACHOL) 20 MG tablet TAKE 1 TABLET(20 MG) BY MOUTH AT BEDTIME 09/08/22   Strader, [place] M, PA-C  propranolol (INDERAL) 40 MG tablet Take 40 mg by mouth daily. 05/31/22   [provider]  spironolactone (ALDACTONE) 25 MG tablet Take 0.5 tablets (12.5 mg total) by mouth daily. 08/26/21   Roxan Hockey, MD  torsemide (DEMADEX) 20 MG tablet Take 3 tablets (60 mg total) by mouth every morning AND 2 tablets (40 mg total) every evening. 10/20/22 10/15/23  Satira Sark, MD  traMADol (ULTRAM) 50 MG tablet TAKE 1 TABLET(50 MG) BY MOUTH EVERY 6 HOURS AS NEEDED 08/23/22   Carole Civil, MD  venlafaxine XR (EFFEXOR-XR) 75 MG 24 hr capsule Take 1 capsule (75 mg total) by mouth daily with breakfast. 08/25/21   Roxan Hockey, MD      Allergies    Fluticasone    Review of Systems   Review of Systems  All other systems reviewed and are negative.   Physical Exam Updated Vital Signs BP (!) 150/86   Pulse 63   Temp 97.6 F (36.4 C) (Oral)   Resp 18   Ht 5' 10"  (1.778 m)   Wt 117.9 kg   SpO2 98%   BMI 37.29 kg/m  Physical Exam Vitals and nursing note reviewed.  Constitutional:      General: He is not in acute distress.    Appearance: He is well-developed. He is not diaphoretic.     Comments: Patient is a chronically ill-appearing male in no acute distress.   HENT:     Head: Normocephalic and atraumatic.  Cardiovascular:     Rate and Rhythm: Normal rate and regular rhythm.     Heart sounds: No murmur heard.    No friction rub.  Pulmonary:     Effort: Pulmonary effort is normal. No respiratory distress.     Breath sounds: Normal breath sounds. No wheezing or rales.  Abdominal:     General: Bowel sounds are normal. There is no distension.     Palpations: There is fluid wave.     Tenderness: There is no abdominal tenderness.     Comments: Abdomen is firm and distended with positive fluid wave.  Musculoskeletal:  General: Normal range of motion.     Cervical back: Normal range of motion and neck supple.     Right lower leg: Edema present.     Left lower leg: Edema present.     Comments: Lower extremities have the appearance of chronic lymphedema.  Skin:    General: Skin is warm and dry.  Neurological:     Mental Status: He is alert and oriented to person, place, and time.     Coordination: Coordination normal.     ED Results / Procedures / Treatments   Labs (all labs ordered are listed, but only abnormal results are displayed) Labs Reviewed  BASIC METABOLIC PANEL - Abnormal; Notable for the following components:      Result Value   Sodium 134 (*)    Glucose, Bld 164 (*)    Creatinine, Ser 1.36 (*)    Calcium 8.5 (*)    GFR, Estimated 59 (*)    All other components within normal limits  CBC - Abnormal; Notable for the following components:   RBC 3.50 (*)    Hemoglobin 11.4 (*)    HCT 33.1 (*)    RDW 15.9 (*)    Platelets 131 (*)    All other components within normal limits  TROPONIN I (HIGH SENSITIVITY)  TROPONIN I (HIGH SENSITIVITY)    EKG EKG Interpretation  Date/Time:  Thursday October 21 2022 02:25:39 EST Ventricular Rate:  69 PR Interval:  159 QRS Duration: 95 QT Interval:  413 QTC Calculation: 443 R Axis:   53 Text Interpretation: Sinus rhythm Low voltage, extremity and precordial leads No significant  change since // Confirmed by Veryl Speak [phone]) on 10/21/2022 3:10:19 AM  Radiology DG Chest Portable 1 View  Result Date: 10/21/2022 CLINICAL DATA:  Chest pain EXAM: PORTABLE CHEST 1 VIEW COMPARISON:  09/12/2022 FINDINGS: Lungs are clear.  No pleural effusion or pneumothorax. The heart is normal in size. Postsurgical changes related to prior CABG. Median sternotomy. IMPRESSION: No evidence of acute cardiopulmonary disease. Electronically Signed   By: Julian Hy M.D.   On: 10/21/2022 02:45    Procedures Procedures    Medications Ordered in ED Medications - No data to display  ED Course/ Medical Decision Making/ A&P  Patient is a 61 year old male with extensive past medical history as described in the HPI.  He presents today with complaints of chest discomfort and abdominal distention.  Patient arrives here with stable vital signs, no hypoxia.  He does have a significantly distended abdomen with fluid wave.  Work-up initiated including CBC, CMP, and troponin.  These are all essentially unremarkable.  Chest x-ray also obtained showing no acute process.  Patient presenting with weight gain and increased abdominal distention in the setting of liver disease.  Presentation consistent with ascites that I suspect is compressing his diaphragm and causing his shortness of breath and chest discomfort.  There is no evidence at this point for a cardiac etiology.  Patient will be admitted to the hospitalist service for further monitoring, trending of heart enzymes, and possible paracentesis.  Final Clinical Impression(s) / ED Diagnoses Final diagnoses:  None    Rx / DC Orders ED Discharge Orders     None         Veryl Speak, MD 10/21/22 [phone]

## 2022-10-22 ENCOUNTER — Observation Stay (HOSPITAL_COMMUNITY): Payer: Federal, State, Local not specified - PPO

## 2022-10-22 DIAGNOSIS — E8770 Fluid overload, unspecified: Secondary | ICD-10-CM | POA: Diagnosis not present

## 2022-10-22 DIAGNOSIS — Z23 Encounter for immunization: Secondary | ICD-10-CM | POA: Diagnosis not present

## 2022-10-22 DIAGNOSIS — I130 Hypertensive heart and chronic kidney disease with heart failure and stage 1 through stage 4 chronic kidney disease, or unspecified chronic kidney disease: Secondary | ICD-10-CM | POA: Diagnosis present

## 2022-10-22 DIAGNOSIS — K7460 Unspecified cirrhosis of liver: Secondary | ICD-10-CM | POA: Diagnosis present

## 2022-10-22 DIAGNOSIS — K219 Gastro-esophageal reflux disease without esophagitis: Secondary | ICD-10-CM | POA: Diagnosis present

## 2022-10-22 DIAGNOSIS — I8510 Secondary esophageal varices without bleeding: Secondary | ICD-10-CM | POA: Diagnosis present

## 2022-10-22 DIAGNOSIS — Z8249 Family history of ischemic heart disease and other diseases of the circulatory system: Secondary | ICD-10-CM | POA: Diagnosis not present

## 2022-10-22 DIAGNOSIS — K8020 Calculus of gallbladder without cholecystitis without obstruction: Secondary | ICD-10-CM | POA: Diagnosis not present

## 2022-10-22 DIAGNOSIS — I5032 Chronic diastolic (congestive) heart failure: Secondary | ICD-10-CM | POA: Diagnosis present

## 2022-10-22 DIAGNOSIS — E1149 Type 2 diabetes mellitus with other diabetic neurological complication: Secondary | ICD-10-CM | POA: Diagnosis present

## 2022-10-22 DIAGNOSIS — I350 Nonrheumatic aortic (valve) stenosis: Secondary | ICD-10-CM | POA: Diagnosis present

## 2022-10-22 DIAGNOSIS — K7581 Nonalcoholic steatohepatitis (NASH): Secondary | ICD-10-CM

## 2022-10-22 DIAGNOSIS — E2740 Unspecified adrenocortical insufficiency: Secondary | ICD-10-CM | POA: Diagnosis present

## 2022-10-22 DIAGNOSIS — E1122 Type 2 diabetes mellitus with diabetic chronic kidney disease: Secondary | ICD-10-CM | POA: Diagnosis present

## 2022-10-22 DIAGNOSIS — D890 Polyclonal hypergammaglobulinemia: Secondary | ICD-10-CM | POA: Diagnosis present

## 2022-10-22 DIAGNOSIS — E785 Hyperlipidemia, unspecified: Secondary | ICD-10-CM | POA: Diagnosis present

## 2022-10-22 DIAGNOSIS — K7290 Hepatic failure, unspecified without coma: Secondary | ICD-10-CM | POA: Diagnosis present

## 2022-10-22 DIAGNOSIS — R188 Other ascites: Secondary | ICD-10-CM | POA: Diagnosis present

## 2022-10-22 DIAGNOSIS — D509 Iron deficiency anemia, unspecified: Secondary | ICD-10-CM | POA: Diagnosis present

## 2022-10-22 DIAGNOSIS — E669 Obesity, unspecified: Secondary | ICD-10-CM | POA: Diagnosis present

## 2022-10-22 DIAGNOSIS — Z79899 Other long term (current) drug therapy: Secondary | ICD-10-CM | POA: Diagnosis not present

## 2022-10-22 DIAGNOSIS — Z794 Long term (current) use of insulin: Secondary | ICD-10-CM | POA: Diagnosis not present

## 2022-10-22 DIAGNOSIS — I81 Portal vein thrombosis: Secondary | ICD-10-CM | POA: Diagnosis not present

## 2022-10-22 DIAGNOSIS — I959 Hypotension, unspecified: Secondary | ICD-10-CM | POA: Diagnosis present

## 2022-10-22 DIAGNOSIS — N1831 Chronic kidney disease, stage 3a: Secondary | ICD-10-CM | POA: Diagnosis present

## 2022-10-22 DIAGNOSIS — K766 Portal hypertension: Secondary | ICD-10-CM | POA: Diagnosis present

## 2022-10-22 DIAGNOSIS — I2510 Atherosclerotic heart disease of native coronary artery without angina pectoris: Secondary | ICD-10-CM | POA: Diagnosis present

## 2022-10-22 DIAGNOSIS — K767 Hepatorenal syndrome: Secondary | ICD-10-CM | POA: Diagnosis present

## 2022-10-22 DIAGNOSIS — Z951 Presence of aortocoronary bypass graft: Secondary | ICD-10-CM | POA: Diagnosis not present

## 2022-10-22 DIAGNOSIS — R079 Chest pain, unspecified: Secondary | ICD-10-CM | POA: Diagnosis not present

## 2022-10-22 DIAGNOSIS — R0789 Other chest pain: Secondary | ICD-10-CM | POA: Diagnosis not present

## 2022-10-22 DIAGNOSIS — K7469 Other cirrhosis of liver: Secondary | ICD-10-CM | POA: Diagnosis not present

## 2022-10-22 LAB — COMPREHENSIVE METABOLIC PANEL
ALT: 10 U/L (ref 0–44)
AST: 29 U/L (ref 15–41)
Albumin: 2.8 g/dL — ABNORMAL LOW (ref 3.5–5.0)
Alkaline Phosphatase: 56 U/L (ref 38–126)
Anion gap: 8 (ref 5–15)
BUN: 16 mg/dL (ref 8–23)
CO2: 26 mmol/L (ref 22–32)
Calcium: 8.3 mg/dL — ABNORMAL LOW (ref 8.9–10.3)
Chloride: 102 mmol/L (ref 98–111)
Creatinine, Ser: 1.13 mg/dL (ref 0.61–1.24)
GFR, Estimated: >60 mL/min (ref >60)
Glucose, Bld: 172 mg/dL — ABNORMAL HIGH (ref 70–99)
Potassium: 3.5 mmol/L (ref 3.5–5.1)
Sodium: 136 mmol/L (ref 135–145)
Total Bilirubin: 1.9 mg/dL — ABNORMAL HIGH (ref 0.3–1.2)
Total Protein: 6.6 g/dL (ref 6.5–8.1)

## 2022-10-22 LAB — LIPID PANEL
Cholesterol: 105 mg/dL (ref 0–200)
HDL: 31 mg/dL — ABNORMAL LOW (ref >40)
LDL Cholesterol: 62 mg/dL (ref 0–99)
Total CHOL/HDL Ratio: 3.4 RATIO
Triglycerides: 61 mg/dL (ref <150)
VLDL: 12 mg/dL (ref 0–40)

## 2022-10-22 LAB — GLUCOSE, CAPILLARY
Glucose-Capillary: 152 mg/dL — ABNORMAL HIGH (ref 70–99)
Glucose-Capillary: 228 mg/dL — ABNORMAL HIGH (ref 70–99)
Glucose-Capillary: 334 mg/dL — ABNORMAL HIGH (ref 70–99)

## 2022-10-22 LAB — PROTIME-INR
INR: 1.3 — ABNORMAL HIGH (ref 0.8–1.2)
Prothrombin Time: 16.4 seconds — ABNORMAL HIGH (ref 11.4–15.2)

## 2022-10-22 LAB — MAGNESIUM: Magnesium: 2.1 mg/dL (ref 1.7–2.4)

## 2022-10-22 LAB — AMYLASE, BODY FLUID (OTHER): Amylase, Body Fluid: 11 U/L

## 2022-10-22 LAB — CYTOLOGY - NON PAP

## 2022-10-22 MED ORDER — IPRATROPIUM-ALBUTEROL 0.5-2.5 (3) MG/3ML IN SOLN: 3.0000 mL | Freq: Four times a day (QID) | RESPIRATORY_TRACT | Status: DC | PRN

## 2022-10-22 NOTE — Assessment & Plan Note (Addendum)
--   continue diuretics as ordered and sodium restricted diet

## 2022-10-22 NOTE — Progress Notes (Cosign Needed Addendum)
Gastroenterology Progress Note   Referring Provider: No ref. provider found Primary Care Physician:  [name], MD Primary Gastroenterologist:  [name].[place]ora, MD and [facility]  Patient ID: [name]; [id number]; 05/26/1961    Subjective   Tolerating diet until made NPO for ultrasound. Denies abdominal pain, nausea, vomiting, shortness of breath. Passing gas, had a BM this morning. Denies melena or BRBPR. Has been having an adequate response to diuretics. Reiterates he has follow up with [facility] next month. States he is also due to see ortho for consideration of right knee replacement.   Weight down 1.4kg from admission.    Objective   Vital signs in last 24 hours Temp:  [97.7 F (36.5 C)-98 F (36.7 C)] 97.9 F (36.6 C) (11/10 0455) Pulse Rate:  [69-76] 69 (11/10 0455) Resp:  [20] 20 (11/10 0455) BP: (110-123)/(65-70) 110/65 (11/10 0455) SpO2:  [95 %-100 %] 99 % (11/10 0813) Weight:  [116.5 kg] 116.5 kg (11/10 0500) Last BM Date : 10/21/22  Physical Exam General:   Alert and oriented, pleasant Head:  Normocephalic and atraumatic. Eyes:  No icterus, sclera clear. Conjuctiva pink.  Mouth:  Without lesions, mucosa pink and moist.  Heart:  S1, S2 present, no murmurs noted.  Lungs: Clear to auscultation bilaterally, without wheezing, rales, or rhonchi.  Abdomen:  Bowel sounds present, soft, non-tender, mildly distended. No HSM or hernias noted. No rebound or guarding. No masses appreciated. Paracentesis site C/D/I.  Msk:  Symmetrical without gross deformities. Normal posture. Extremities:  +1 edema to mid bilateral thigh, +2 edema below knees bilaterally. Neurologic:  Alert and  oriented x4;  grossly normal neurologically. No signs of HE, no asterixis.  Skin:  Warm and dry, intact without significant lesions.  Psych:  Alert and cooperative. Normal mood and affect.  Intake/Output from previous day: 11/09 0701 - 11/10 0700 In: 120 [P.O.:120] Out: 400  [Urine:400] Intake/Output this shift: No intake/output data recorded.  Lab Results  Recent Labs    10/21/22 0259  WBC 4.6  HGB 11.4*  HCT 33.1*  PLT 131*   BMET Recent Labs    10/21/22 0259 10/22/22 0413  NA 134* 136  K 4.0 3.5  CL 99 102  CO2 27 26  GLUCOSE 164* 172*  BUN 19 16  CREATININE 1.36* 1.13  CALCIUM 8.5* 8.3*   LFT Recent Labs    10/21/22 1510 10/22/22 0413  PROT 6.8 6.6  ALBUMIN 2.8* 2.8*  AST 32 29  ALT 11 10  ALKPHOS 62 56  BILITOT 1.9* 1.9*  BILIDIR 0.5*  --   IBILI 1.4*  --    PT/INR Recent Labs    10/21/22 0649 10/22/22 0413  LABPROT 16.4* 16.4*  INR 1.3* 1.3*   Hepatitis Panel No results for input(s): "HEPBSAG", "HCVAB", "HEPAIGM", "HEPBIGM" in the last 72 hours.   Studies/Results US Paracentesis  Result Date: 10/21/2022 INDICATION: Ascites NASH Cirrhosis EXAM: ULTRASOUND GUIDED RLQ PARACENTESIS MEDICATIONS: 10 cc 1% lidocaine COMPLICATIONS: None immediate. PROCEDURE: Informed written consent was obtained from the patient after a discussion of the risks, benefits and alternatives to treatment. A timeout was performed prior to the initiation of the procedure. Initial ultrasound scanning demonstrates a large amount of ascites within the right lower abdominal quadrant. The right lower abdomen was prepped and draped in the usual sterile fashion. 1% lidocaine was used for local anesthesia. Following this, a Yueh catheter was introduced. An ultrasound image was saved for documentation purposes. The paracentesis was performed. The catheter was  removed and a dressing was applied. The patient tolerated the procedure well without immediate post procedural complication. Patient received post-procedure intravenous albumin; see nursing notes for details. FINDINGS: A total of approximately 4 liters Max - per protocol of yellow fluid was removed. Samples were sent to the laboratory as requested by the clinical team. IMPRESSION: Successful ultrasound-guided  paracentesis yielding 4 liters of peritoneal fluid. Read by Lavonia Drafts [facility] Electronically Signed   By: Lavonia Dana M.D.   On: 10/21/2022 10:07   CT ABDOMEN PELVIS WO CONTRAST  Result Date: 10/21/2022 CLINICAL DATA:  Abdominal pain with difficulty breathing. Increasing abdominal distension and weight gain. EXAM: CT ABDOMEN AND PELVIS WITHOUT CONTRAST TECHNIQUE: Multidetector CT imaging of the abdomen and pelvis was performed following the standard protocol without IV contrast. RADIATION DOSE REDUCTION: This exam was performed according to the departmental dose-optimization program which includes automated exposure control, adjustment of the mA and/or kV according to patient size and/or use of iterative reconstruction technique. COMPARISON:  03/17/2021 FINDINGS: Lower chest: Unremarkable. Hepatobiliary: Nodular liver contour is compatible with cirrhosis. No focal liver lesion evident on this noncontrast study. Layering tiny calcified gallstones evident. No gallbladder wall thickening evident. No intrahepatic or extrahepatic biliary dilation. Pancreas: No focal mass lesion. No dilatation of the main duct. No intraparenchymal cyst. No peripancreatic edema. Spleen: Splenomegaly (estimated volume 1469 cc). Adrenals/Urinary Tract: No adrenal nodule or mass. Kidneys unremarkable. No evidence for hydroureter. The urinary bladder appears normal for the degree of distention. Stomach/Bowel: Stomach is unremarkable. No gastric wall thickening. No evidence of outlet obstruction. Duodenum is normally positioned as is the ligament of Treitz. No small bowel wall thickening. No small bowel dilatation. The appendix is not well visualized, but there is no edema or inflammation in the region of the cecum. No gross colonic mass. No colonic wall thickening. Vascular/Lymphatic: There is mild atherosclerotic calcification of the abdominal aorta without aneurysm. Recanalization of the paraumbilical vein evident. There is no  gastrohepatic or hepatoduodenal ligament lymphadenopathy. No retroperitoneal or mesenteric lymphadenopathy. No pelvic sidewall lymphadenopathy. Reproductive: The prostate gland and seminal vesicles are unremarkable. Other: Large volume abdominopelvic ascites evident. This is associated with edema in the omentum and mesentery. Body wall edema evident as well. Musculoskeletal: No worrisome lytic or sclerotic osseous abnormality. IMPRESSION: 1. Cirrhotic liver with evidence of portal venous hypertension including splenomegaly and recanalization of the paraumbilical vein. Of the 2. Large volume abdominopelvic ascites with edema in the omentum and mesentery. 3. Cholelithiasis. 4.  Aortic Atherosclerosis (ICD10-I70.0). Electronically Signed   By: Misty Stanley M.D.   On: 10/21/2022 06:11   DG Chest Portable 1 View  Result Date: 10/21/2022 CLINICAL DATA:  Chest pain EXAM: PORTABLE CHEST 1 VIEW COMPARISON:  09/12/2022 FINDINGS: Lungs are clear.  No pleural effusion or pneumothorax. The heart is normal in size. Postsurgical changes related to prior CABG. Median sternotomy. IMPRESSION: No evidence of acute cardiopulmonary disease. Electronically Signed   By: Julian Hy M.D.   On: 10/21/2022 02:45    Assessment  61 y.o. male with a history of cirrhosis followed at [facility] liver clinic, HTN, OSA, type 2 diabetes, CAD s/p CABG, HFpEF, mild aortic stenosis, adrenal sufficiency (diagnosed April 2022 and placed on hydrocortisone and midodrine but no longer on hydrocortisone), CKD, and polyclonal gammopathy who presented to the ED with complaints of chest/epigastric pain located the xiphoid process.  GI consulted for decompensated cirrhosis.  NASH cirrhosis: Followed by [facility] liver clinic for consideration of transplantation.  Last visit in April with  MELD score 10.  Has known esophageal varices with multiple prior banding's and GAVE.  He has an appointment in December with [facility] for a clinic visit, ultrasound, and  EGD/TCS.  He presented with epigastric/chest pain and anasarca and worsening shortness of breath and abdominal ascension over the last couple weeks.  He has been followed with cardiology and receiving increasing doses of torsemide.  He not paracentesis yesterday 11/9 with 4 L of ascites removed and negative for SBP, culture currently without any growth.  Continues to have 2+ pitting edema to his bilateral lower extremities up to mid thigh.  No significant abdominal wall edema, exam is soft and nontender.  No evidence of hepatic encephalopathy. Improvement in renal function today with Cr 1.13 (1.36), MELD 17 yesterday. Today MELD sodium: 14. Has had an adequate response to diuretics with 1L urine observed in canister today on exam. Has had a 1.4kg weight loss in 24 hours. Feeling well without any pain or shortness of breath. No signs of HE. We will follow-up liver Doppler ultrasound to rule out portal vein thrombosis. Albumin level remains at 2.8, may benefit from additional albumin if renal function worsens.   Plan / Recommendations  Follow up liver doppler Continue IV lasix 40 mg daily, if continued improvement in edema and no worsening renal function, can transition to oral.  Spironolactone 100 mg daily Continue lactulose 20g daily Continue daily PPI Heart healthy, 2g sodium diet Daily MELD labs (CMP, INR), trend renal function Follow up outpatient with nephrology Keep follow up with [facility] next month.    Addendum: Liver doppler with Cirrhotic liver morphology with sequela of portal hypertension (splenomegaly, large volume ascites). Patent hepatic vasculature with appropriate direction of flow. Cholelithiasis without acute cholecystitis.thrombosis not contributing to ascites production.      LOS: 0 days    10/22/2022, 10:01 AM   Venetia Night, MSN, FNP-BC, AGACNP-BC [facility]

## 2022-10-22 NOTE — Progress Notes (Signed)
Patient a/ox4. Patient rested comfortably this shift. Patient had no complaints of pain. Cpap on. Will continue to monitor.

## 2022-10-22 NOTE — Hospital Course (Signed)
62 y.o. male with medical history significant of anemia, coronary artery disease, cirrhosis, hypertension, hyperlipidemia, OSA, type 2 diabetes mellitus, presents to the ED with a chief complaint of chest/epigastric pain.  The pain is located right at the xiphoid process really.  It is a 7 out of 10 on the pain scale when its at its worst.  He reports he can get comfortable and can get to sleep.  Pain started at midnight on November 9.  He went out to sit in a recliner chair and that did help the pain a bit.  He describes the pain as a pressure.  It is better now than when he first came in.  He thinks Naiping is what helped it.  He reports associated mild nonproductive cough.  He has associated dyspnea with exertion.  Patient reports he had nausea all week but has not had any vomiting.  He has tried Mylanta for the nausea and it has not helped.  He reports no lower abdominal pain.  His epigastric pain has been present for 1-2 weeks.  Patient reports he is having regular bowel movements as he does take lactulose daily.  He denies any dysuria.  Patient reports that his abdomen has been distended for 2 weeks.  He saw cardiology who increased his torsemide to 60 mg in the a.m. and 40 mg in the p.m.  Reports he is only been on that regimen about 1 day.  He lost 10 pounds last week, note been regained 14 pounds this week.  His dry weight is around 260 pounds.  Patient has never had a paracentesis before.  He was diagnosed with [name] approximately 2 years ago.  He was on the transplant list, but is not on that list any longer.  He follows with specialist at [facility].  Wife reports that he also has a history of adrenal insufficiency, but he is not on any steroids.  He follows with endocrinology at [facility] as well.  Patient has no other complaints at this time.

## 2022-10-22 NOTE — Assessment & Plan Note (Signed)
--   improved after paracentesis where 4L fluid was removed

## 2022-10-22 NOTE — Progress Notes (Signed)
PROGRESS NOTE   [name]  BHA:[id number] DOB: 02-27-1961 DOA: 10/21/2022 PCP: [name], MD   Chief Complaint  Patient presents with   Chest Pain   Level of care: Telemetry  Brief Admission History:  61 y.o. male with medical history significant of anemia, coronary artery disease, cirrhosis, hypertension, hyperlipidemia, OSA, type 2 diabetes mellitus, presents to the ED with a chief complaint of chest/epigastric pain.  The pain is located right at the xiphoid process really.  It is a 7 out of 10 on the pain scale when its at its worst.  He reports he can get comfortable and can get to sleep.  Pain started at midnight on November 9.  He went out to sit in a recliner chair and that did help the pain a bit.  He describes the pain as a pressure.  It is better now than when he first came in.  He thinks Naiping is what helped it.  He reports associated mild nonproductive cough.  He has associated dyspnea with exertion.  Patient reports he had nausea all week but has not had any vomiting.  He has tried Mylanta for the nausea and it has not helped.  He reports no lower abdominal pain.  His epigastric pain has been present for 1-2 weeks.  Patient reports he is having regular bowel movements as he does take lactulose daily.  He denies any dysuria.  Patient reports that his abdomen has been distended for 2 weeks.  He saw cardiology who increased his torsemide to 60 mg in the a.m. and 40 mg in the p.m.  Reports he is only been on that regimen about 1 day.  He lost 10 pounds last week, note been regained 14 pounds this week.  His dry weight is around 260 pounds.  Patient has never had a paracentesis before.  He was diagnosed with [name] approximately 2 years ago.  He was on the transplant list, but is not on that list any longer.  He follows with specialist at [facility].  Wife reports that he also has a history of adrenal insufficiency, but he is not on any steroids.  He follows with endocrinology at [facility] as  well.  Patient has no other complaints at this time.     Assessment and Plan: * Chest pain - secondary to severe abdominal ascites, resolved after paracentesis   Abdominal pain, epigastric -- improved after paracentesis where 4L fluid was removed  Hypervolemia -- continue diuretics as ordered  CAD (coronary artery disease) - Continue aspirin, beta-blocker, statin - Likely no ACE due to renal insufficiency - Initial troponin 4 - Chest x-ray shows no evidence of acute cardiopulmonary disease - EKG shows sinus rhythm with a heart rate of 69, QTc 443 - Monitor on telemetry  CKD stage 3 due to type 2 diabetes mellitus (HCC) - GFR 59 - Avoid nephrotoxic agents when possible - Creatinine is slightly bumped likely due to hepatorenal syndrome given fluid overload - Continue to monitor  Chronic diastolic CHF (congestive heart failure) (HCC) - Last ejection fraction was 60-65 percent - Grade 1 diastolic dysfunction - Continue aspirin, continue beta-blocker, continue renal lactone, continue diuretic, continue to monitor  Hypotension - Continue midodrine  GERD (gastroesophageal reflux disease) - Continue Protonix  Anasarca - Likely related to NASH - Does have a history of CHF, with a last ejection fraction being 60-65% with grade 1 diastolic dysfunction on an echo in June 2023 - Monitor intake and output - Fluid restriction - Continue  to monitor  NASH cirrhosis of liver (HCC) - Continue spironolactone, continue furosemide - Continue lactulose - Patient was on the transplant list, but taken off  - Follows with specialist at [facility] - s/p paracentesis 11/9 with 4L fluid removed  Diabetes mellitus, type 2 controlled with neurological complications - Last hemoglobin A1c 6.7 - 30 units of basal insulin at baseline - Continue 20 units of basal insulin with prandial and sliding scale coverage - Carb modified diet - Continue to monitor   DVT prophylaxis: Mason heparin  Code Status:  Full  Family Communication:  Disposition: Status is: Observation The patient remains OBS appropriate and will d/c before 2 midnights.   Consultants:  GI service  Procedures:  Paracentesis 11/9 Antimicrobials:    Subjective: Pt reports he has been urinating frequently on diuretics, abdomen feels better, no more chest Objective: Vitals:   10/22/22 0455 10/22/22 0500 10/22/22 0804 10/22/22 0813  BP: 110/65     Pulse: 69     Resp: 20     Temp: 97.9 F (36.6 C)     TempSrc: Oral     SpO2: 97%  95% 99%  Weight:  116.5 kg    Height:        Intake/Output Summary (Last 24 hours) at 10/22/2022 1156 Last data filed at 10/22/2022 1000 Gross per 24 hour  Intake 120 ml  Output 1400 ml  Net -1280 ml   Filed Weights   10/21/22 0224 10/22/22 0500  Weight: 117.9 kg 116.5 kg   Examination:  General exam: Appears calm and comfortable  Respiratory system: Clear to auscultation. Respiratory effort normal. Cardiovascular system: normal S1 & S2 heard. No JVD, murmurs, rubs, gallops or clicks. No pedal edema. Gastrointestinal system: Abdomen is nondistended, soft and nontender. No organomegaly or masses felt. Normal bowel sounds heard. Central nervous system: Alert and oriented. No focal neurological deficits. Extremities: Symmetric 5 x 5 power. Skin: No rashes, lesions or ulcers. Psychiatry: Judgement and insight appear normal. Mood & affect appropriate.   Data Reviewed: I have personally reviewed following labs and imaging studies  CBC: Recent Labs  Lab 10/21/22 0259  WBC 4.6  HGB 11.4*  HCT 33.1*  MCV 94.6  PLT 131*    Basic Metabolic Panel: Recent Labs  Lab 10/21/22 0259 10/22/22 0413  NA 134* 136  K 4.0 3.5  CL 99 102  CO2 27 26  GLUCOSE 164* 172*  BUN 19 16  CREATININE 1.36* 1.13  CALCIUM 8.5* 8.3*  MG  --  2.1    CBG: Recent Labs  Lab 10/21/22 0746 10/21/22 1130 10/21/22 1724 10/21/22 2155 10/22/22 0743  GLUCAP 213* 208* 197* 269* 152*    Recent  Results (from the past 240 hour(s))  Gram stain     Status: None   Collection Time: 10/21/22  8:45 AM   Specimen: Peritoneal Washings  Result Value Ref Range Status   Specimen Description PERITONEAL  Final   Special Requests PERITONEAL  Final   Gram Stain   Final    NO ORGANISMS SEEN WBC PRESENT,BOTH PMN AND MONONUCLEAR CYTOSPIN SMEAR Performed at [facility], [address] [place], [place] [address]    Report Status 10/21/2022 FINAL  Final  Culture, body fluid w Gram Stain-bottle     Status: None (Preliminary result)   Collection Time: 10/21/22  8:45 AM   Specimen: Peritoneal Washings  Result Value Ref Range Status   Specimen Description PERITONEAL  Final   Special Requests 10CC BOTTLES DRAWN AEROBIC AND ANAEROBIC  Final   Culture   Final    NO GROWTH < 24 HOURS Performed at [facility], [address] [place], [place] [address]    Report Status PENDING  Incomplete     Radiology Studies: US Paracentesis  Result Date: 10/21/2022 INDICATION: Ascites NASH Cirrhosis EXAM: ULTRASOUND GUIDED RLQ PARACENTESIS MEDICATIONS: 10 cc 1% lidocaine COMPLICATIONS: None immediate. PROCEDURE: Informed written consent was obtained from the patient after a discussion of the risks, benefits and alternatives to treatment. A timeout was performed prior to the initiation of the procedure. Initial ultrasound scanning demonstrates a large amount of ascites within the right lower abdominal quadrant. The right lower abdomen was prepped and draped in the usual sterile fashion. 1% lidocaine was used for local anesthesia. Following this, a Yueh catheter was introduced. An ultrasound image was saved for documentation purposes. The paracentesis was performed. The catheter was removed and a dressing was applied. The patient tolerated the procedure well without immediate post procedural complication. Patient received post-procedure intravenous albumin; see nursing notes for details. FINDINGS: A total of approximately  4 liters Max - per protocol of yellow fluid was removed. Samples were sent to the laboratory as requested by the clinical team. IMPRESSION: Successful ultrasound-guided paracentesis yielding 4 liters of peritoneal fluid. Read by Lavonia Drafts [facility] Electronically Signed   By: Lavonia Dana M.D.   On: 10/21/2022 10:07   CT ABDOMEN PELVIS WO CONTRAST  Result Date: 10/21/2022 CLINICAL DATA:  Abdominal pain with difficulty breathing. Increasing abdominal distension and weight gain. EXAM: CT ABDOMEN AND PELVIS WITHOUT CONTRAST TECHNIQUE: Multidetector CT imaging of the abdomen and pelvis was performed following the standard protocol without IV contrast. RADIATION DOSE REDUCTION: This exam was performed according to the departmental dose-optimization program which includes automated exposure control, adjustment of the mA and/or kV according to patient size and/or use of iterative reconstruction technique. COMPARISON:  03/17/2021 FINDINGS: Lower chest: Unremarkable. Hepatobiliary: Nodular liver contour is compatible with cirrhosis. No focal liver lesion evident on this noncontrast study. Layering tiny calcified gallstones evident. No gallbladder wall thickening evident. No intrahepatic or extrahepatic biliary dilation. Pancreas: No focal mass lesion. No dilatation of the main duct. No intraparenchymal cyst. No peripancreatic edema. Spleen: Splenomegaly (estimated volume 1469 cc). Adrenals/Urinary Tract: No adrenal nodule or mass. Kidneys unremarkable. No evidence for hydroureter. The urinary bladder appears normal for the degree of distention. Stomach/Bowel: Stomach is unremarkable. No gastric wall thickening. No evidence of outlet obstruction. Duodenum is normally positioned as is the ligament of Treitz. No small bowel wall thickening. No small bowel dilatation. The appendix is not well visualized, but there is no edema or inflammation in the region of the cecum. No gross colonic mass. No colonic wall thickening.  Vascular/Lymphatic: There is mild atherosclerotic calcification of the abdominal aorta without aneurysm. Recanalization of the paraumbilical vein evident. There is no gastrohepatic or hepatoduodenal ligament lymphadenopathy. No retroperitoneal or mesenteric lymphadenopathy. No pelvic sidewall lymphadenopathy. Reproductive: The prostate gland and seminal vesicles are unremarkable. Other: Large volume abdominopelvic ascites evident. This is associated with edema in the omentum and mesentery. Body wall edema evident as well. Musculoskeletal: No worrisome lytic or sclerotic osseous abnormality. IMPRESSION: 1. Cirrhotic liver with evidence of portal venous hypertension including splenomegaly and recanalization of the paraumbilical vein. Of the 2. Large volume abdominopelvic ascites with edema in the omentum and mesentery. 3. Cholelithiasis. 4.  Aortic Atherosclerosis (ICD10-I70.0). Electronically Signed   By: Misty Stanley M.D.   On: 10/21/2022 06:11   DG Chest Portable 1  View  Result Date: 10/21/2022 CLINICAL DATA:  Chest pain EXAM: PORTABLE CHEST 1 VIEW COMPARISON:  09/12/2022 FINDINGS: Lungs are clear.  No pleural effusion or pneumothorax. The heart is normal in size. Postsurgical changes related to prior CABG. Median sternotomy. IMPRESSION: No evidence of acute cardiopulmonary disease. Electronically Signed   By: Julian Hy M.D.   On: 10/21/2022 02:45    Scheduled Meds:  aspirin EC  81 mg Oral QPM   furosemide  40 mg Intravenous Daily   heparin  5,000 Units Subcutaneous Q8H   insulin aspart  0-15 Units Subcutaneous TID WC   insulin aspart  0-5 Units Subcutaneous QHS   insulin aspart  5 Units Subcutaneous TID WC   insulin detemir  20 Units Subcutaneous QHS   ipratropium-albuterol  3 mL Inhalation BID   lactulose  20 g Oral Daily   midodrine  10 mg Oral TID WC   mometasone-formoterol  2 puff Inhalation BID   nadolol  20 mg Oral Daily   pantoprazole  40 mg Oral Daily   pravastatin  20 mg Oral  q1800   spironolactone  100 mg Oral Daily   venlafaxine XR  75 mg Oral Q breakfast   Continuous Infusions:   LOS: 0 days   Time spent: 36 mins  Gianni Mihalik Wynetta Emery, MD How to contact the [facility] Attending or Consulting provider 7A - 7P or covering provider during after hours [place], for this patient?  Check the care team in [facility] and look for a) attending/consulting [facility] provider listed and b) the [facility] team listed Log into [email or web address] and use [place]'s universal password to access. If you do not have the password, please contact the hospital operator. Locate the [facility] provider you are looking for under [facility] and page to a number that you can be directly reached. If you still have difficulty reaching the provider, please page the [facility] (Director on Call) for the Hospitalists listed on amion for assistance.  10/22/2022, 11:56 AM

## 2022-10-23 DIAGNOSIS — E8770 Fluid overload, unspecified: Secondary | ICD-10-CM | POA: Diagnosis not present

## 2022-10-23 DIAGNOSIS — K7469 Other cirrhosis of liver: Secondary | ICD-10-CM | POA: Diagnosis not present

## 2022-10-23 DIAGNOSIS — I5032 Chronic diastolic (congestive) heart failure: Secondary | ICD-10-CM

## 2022-10-23 DIAGNOSIS — R188 Other ascites: Secondary | ICD-10-CM | POA: Diagnosis not present

## 2022-10-23 DIAGNOSIS — R079 Chest pain, unspecified: Secondary | ICD-10-CM

## 2022-10-23 LAB — BASIC METABOLIC PANEL
Anion gap: 6 (ref 5–15)
BUN: 17 mg/dL (ref 8–23)
CO2: 27 mmol/L (ref 22–32)
Calcium: 8.1 mg/dL — ABNORMAL LOW (ref 8.9–10.3)
Chloride: 101 mmol/L (ref 98–111)
Creatinine, Ser: 1.15 mg/dL (ref 0.61–1.24)
GFR, Estimated: >60 mL/min (ref >60)
Glucose, Bld: 190 mg/dL — ABNORMAL HIGH (ref 70–99)
Potassium: 3.8 mmol/L (ref 3.5–5.1)
Sodium: 134 mmol/L — ABNORMAL LOW (ref 135–145)

## 2022-10-23 LAB — GLUCOSE, CAPILLARY
Glucose-Capillary: 137 mg/dL — ABNORMAL HIGH (ref 70–99)
Glucose-Capillary: 182 mg/dL — ABNORMAL HIGH (ref 70–99)
Glucose-Capillary: 245 mg/dL — ABNORMAL HIGH (ref 70–99)

## 2022-10-23 MED ORDER — INSULIN DETEMIR 100 UNIT/ML ~~LOC~~ SOLN
30.0000 [IU] | Freq: Every day | SUBCUTANEOUS | Status: DC
  Filled 2022-10-23: qty 0.3

## 2022-10-23 MED ORDER — INSULIN ASPART 100 UNIT/ML IJ SOLN
8.0000 [IU] | Freq: Three times a day (TID) | INTRAMUSCULAR | Status: DC
  Administered 2022-10-23 (×2): 8 [IU] via SUBCUTANEOUS

## 2022-10-23 MED ORDER — TRESIBA FLEXTOUCH 200 UNIT/ML ~~LOC~~ SOPN: 30.0000 [IU] | PEN_INJECTOR | SUBCUTANEOUS | Status: DC

## 2022-10-23 MED ORDER — SPIRONOLACTONE 100 MG PO TABS: 100.0000 mg | ORAL_TABLET | Freq: Every day | ORAL | 1 refills | Status: DC

## 2022-10-23 MED ORDER — ASPIRIN EC 81 MG PO TBEC: 81.0000 mg | DELAYED_RELEASE_TABLET | Freq: Every evening | ORAL | Status: DC

## 2022-10-23 NOTE — Discharge Summary (Signed)
Physician Discharge Summary  [name]:[id number] DOB: Apr 20, 1961 DOA: 10/21/2022  PCP: [name], MD GI: [facility]   Admit date: 10/21/2022 Discharge date: 10/23/2022  Admitted From:  HOME  Disposition: HOME   Recommendations for Outpatient Follow-up:  Follow up with PCP in 1 weeks to have labs checked Please obtain BMP in 1 week  Follow up with [place] next month as scheduled   Discharge Condition: STABLE   CODE STATUS: FULL DIET: 2 gm sodium restricted /carb modified    Brief Hospitalization Summary: Please see all hospital notes, images, labs for full details of the hospitalization. ADMISSION HPI:  Brief Admission History:  61 y.o. male with medical history significant of anemia, coronary artery disease, cirrhosis, hypertension, hyperlipidemia, OSA, type 2 diabetes mellitus, presents to the ED with a chief complaint of chest/epigastric pain.  The pain is located right at the xiphoid process really.  It is a 7 out of 10 on the pain scale when its at its worst.  He reports he can get comfortable and can get to sleep.  Pain started at midnight on November 9.  He went out to sit in a recliner chair and that did help the pain a bit.  He describes the pain as a pressure.  It is better now than when he first came in.  He thinks [place] is what helped it.  He reports associated mild nonproductive cough.  He has associated dyspnea with exertion.  Patient reports he had nausea all week but has not had any vomiting.  He has tried Mylanta for the nausea and it has not helped.  He reports no lower abdominal pain.  His epigastric pain has been present for 1-2 weeks.  Patient reports he is having regular bowel movements as he does take lactulose daily.  He denies any dysuria.  Patient reports that his abdomen has been distended for 2 weeks.  He saw cardiology who increased his torsemide to 60 mg in the a.m. and 40 mg in the p.m.  Reports he is only been on that regimen  about 1 day.  He lost 10 pounds last week, note been regained 14 pounds this week.  His dry weight is around 260 pounds.  Patient has never had a paracentesis before.  He was diagnosed with [name] approximately 2 years ago.  He was on the transplant list, but is not on that list any longer.  He follows with specialist at [facility].  Wife reports that he also has a history of adrenal insufficiency, but he is not on any steroids.  He follows with endocrinology at [facility] as well.  Patient has no other complaints at this time.  HOSPITAL COURSE BY PROBLEM LIST   Assessment and Plan: * Chest pain - secondary to severe abdominal ascites, resolved after paracentesis   Abdominal pain, epigastric -- improved after paracentesis where 4L fluid was removed  Hypervolemia -- continue diuretics as ordered and sodium restricted diet  CAD (coronary artery disease) - Continue aspirin, beta-blocker, statin - Initial troponins no signs of ischemia - Chest x-ray shows no evidence of acute cardiopulmonary disease - EKG shows sinus rhythm with a heart rate of 69, QTc 443 - Monitored on telemetry  CKD stage 3 due to type 2 diabetes mellitus (HCC) - GFR 59 - STABLE ON DIURETICS; PT TO SEE PCP IN 1 WEEK TO GET REPEAT LABS DONE  Chronic diastolic CHF (congestive heart failure) (HCC) - Last ejection fraction was 60-65 percent - Grade  1 diastolic dysfunction - Continue aspirin, continue beta-blocker, continue renal lactone, continue diuretic, continue to monitor  Hypotension - Continue midodrine  GERD (gastroesophageal reflux disease) - Continue Protonix  Anasarca - Related to NASH and poor compliance with taking home diuretics - Does have a history of CHF, with a last ejection fraction being 60-65% with grade 1 diastolic dysfunction on an echo in June 2023 - 2 gm sodium restriction recommended; responded well to IV lasix  Intake/Output Summary (Last 24 hours) at 10/23/2022 1147 Last data filed at 10/23/2022  1100 Gross per 24 hour  Intake 240 ml  Output 1850 ml  Net -1610 ml   Filed Weights   10/21/22 0224 10/22/22 0500 10/23/22 0500  Weight: 117.9 kg 116.5 kg 116 kg   RESUMING HOME ORAL TORSEMIDE REGIMEN PLUS SPIRONOLACTONE 100 MG DAILY.  PT ADVISED TO SEE PCP IN 1 WEEK TO HAVE LABS CHECKED.  PT VERBALIZED UNDERSTANDING.  PT TO FOLLOW UP WITH [facility] NEXT MONTH (ALREADY SCHEDULED)  NASH cirrhosis of liver (HCC) - Continue spironolactone, continue home torsemide dose - Continue lactulose - Patient was on the transplant list, but taken off  - Follows with specialist at [facility] with appt next month with [facility]  - s/p paracentesis 11/9 with 4L fluid removed;  Pt responded well to IV lasix in hospital with >2L diuresed; creatinine and electrolytes remain stable; resume home torsemide; Pt advised to follow up with PCP in 1 week to have repeat labs drawn to check electrolytes and renal function.  Pt verbalized understanding.    Diabetes mellitus, type 2 controlled with neurological complications - Last hemoglobin A1c 6.7 - 30 units of basal insulin at baseline - Continue  basal insulin and home coverage - Carb modified diet - Continue to monitor with PCP on outpatient basis     Discharge Diagnoses:  Principal Problem:   Chest pain Active Problems:   Diabetes mellitus, type 2 controlled with neurological complications   NASH cirrhosis of liver (HCC)   Anasarca   GERD (gastroesophageal reflux disease)   Hypotension   Chronic diastolic CHF (congestive heart failure) (HCC)   CKD stage 3 due to type 2 diabetes mellitus (HCC)   CAD (coronary artery disease)   Hypervolemia   Abdominal pain, epigastric   Other ascites   Liver cirrhosis secondary to NASH ([place])   Decompensated hepatic cirrhosis ([place])   Discharge Instructions:  Allergies as of 10/23/2022       Reactions   Fluticasone Rash        Medication List     STOP taking these medications     budesonide-formoterol 160-4.5 MCG/ACT inhaler Commonly known as: SYMBICORT   cephALEXin 500 MG capsule Commonly known as: Keflex   ciprofloxacin 500 MG tablet Commonly known as: CIPRO   Combivent Respimat 20-100 MCG/ACT Aers respimat Generic drug: Ipratropium-Albuterol   Comirnaty Susp injection Generic drug: COVID-19 mRNA Vac-TriS (Pfizer)   ferrous sulfate 325 (65 FE) MG tablet   Fluarix Quadrivalent 0.5 ML injection Generic drug: influenza vac split quadrivalent PF   magnesium oxide 400 (240 Mg) MG tablet Commonly known as: MAG-OX   Ozempic (0.25 or 0.5 MG/DOSE) 2 MG/1.5ML Sopn Generic drug: Semaglutide(0.25 or 0.5MG/DOS)   propranolol 40 MG tablet Commonly known as: INDERAL   traMADol 50 MG tablet Commonly known as: ULTRAM       TAKE these medications    acetaminophen 325 MG tablet Commonly known as: TYLENOL Take 650 mg by mouth every 6 (six) hours  as needed for mild pain.   albuterol 108 (90 Base) MCG/ACT inhaler Commonly known as: VENTOLIN HFA Inhale 2 puffs into the lungs every 6 (six) hours as needed for wheezing or shortness of breath.   ALPRAZolam 0.25 MG tablet Commonly known as: XANAX Take 0.25 mg by mouth daily.   Armodafinil 50 MG tablet Take 1 tablet (50 mg total) by mouth as needed.   aspirin EC 81 MG tablet Take 1 tablet (81 mg total) by mouth every evening.   Dialyvite Vitamin D 5000 125 MCG (5000 UT) capsule Generic drug: Cholecalciferol Take 5,000 Units by mouth in the morning.   diphenoxylate-atropine 2.5-0.025 MG tablet Commonly known as: LOMOTIL Take 2 tablets by mouth 4 (four) times daily as needed for diarrhea or loose stools.   FreeStyle Libre 14 Day Sensor Misc SMARTSIG:1 Each Topical Every 2 Weeks   insulin aspart 100 UNIT/ML injection Commonly known as: novoLOG Inject 5 Units into the skin 3 (three) times daily before meals. Special Instructions: If accu-check is greater than 150. Hold for accu-check 150 or below.  With Meals   lactulose 10 GM/15ML solution Commonly known as: CHRONULAC Take 30 mLs (20 g total) by mouth daily.   loratadine 10 MG tablet Commonly known as: CLARITIN Take 10 mg by mouth in the morning.   midodrine 10 MG tablet Commonly known as: PROAMATINE Take 1 tablet (10 mg total) by mouth 3 (three) times daily with meals.   nadolol 20 MG tablet Commonly known as: CORGARD Take 1 tablet (20 mg total) by mouth daily.   nitroGLYCERIN 0.4 MG SL tablet Commonly known as: NITROSTAT Place 1 tablet (0.4 mg total) under the tongue every 5 (five) minutes as needed for chest pain. Do not exceed 3 doses in 15 mins   pantoprazole 40 MG tablet Commonly known as: PROTONIX Take 1 tablet (40 mg total) by mouth daily.   potassium chloride SA 20 MEQ tablet Commonly known as: KLOR-CON M TAKE 1 TABLET(20 MEQ) BY MOUTH DAILY   pravastatin 20 MG tablet Commonly known as: PRAVACHOL TAKE 1 TABLET(20 MG) BY MOUTH AT BEDTIME   spironolactone 100 MG tablet Commonly known as: ALDACTONE Take 1 tablet (100 mg total) by mouth daily. What changed:  medication strength how much to take   torsemide 20 MG tablet Commonly known as: DEMADEX Take 3 tablets (60 mg total) by mouth every morning AND 2 tablets (40 mg total) every evening.   Tyler Aas FlexTouch 200 UNIT/ML FlexTouch Pen Generic drug: insulin degludec Inject 30 Units into the skin See admin instructions. Give 30 units Subcutaneous  every evening   venlafaxine XR 75 MG 24 hr capsule Commonly known as: EFFEXOR-XR Take 1 capsule (75 mg total) by mouth daily with breakfast.        Follow-up Information     [facility]. Go in 1 month(s).   Why: AS SCHEDULED        [name], MD. Schedule an appointment as soon as possible for a visit in 1 week(s).   Specialty: Family Medicine Why: Hospital Follow Up AND CHECK LABS Contact information: [place] [address] [phone]                 Allergies  Allergen Reactions   Fluticasone Rash   Allergies as of 10/23/2022       Reactions   Fluticasone Rash        Medication List     STOP taking these medications    budesonide-formoterol  160-4.5 MCG/ACT inhaler Commonly known as: SYMBICORT   cephALEXin 500 MG capsule Commonly known as: Keflex   ciprofloxacin 500 MG tablet Commonly known as: CIPRO   Combivent Respimat 20-100 MCG/ACT Aers respimat Generic drug: Ipratropium-Albuterol   Comirnaty Susp injection Generic drug: COVID-19 mRNA Vac-TriS (Pfizer)   ferrous sulfate 325 (65 FE) MG tablet   Fluarix Quadrivalent 0.5 ML injection Generic drug: influenza vac split quadrivalent PF   magnesium oxide 400 (240 Mg) MG tablet Commonly known as: MAG-OX   Ozempic (0.25 or 0.5 MG/DOSE) 2 MG/1.5ML Sopn Generic drug: Semaglutide(0.25 or 0.5MG/DOS)   propranolol 40 MG tablet Commonly known as: INDERAL   traMADol 50 MG tablet Commonly known as: ULTRAM       TAKE these medications    acetaminophen 325 MG tablet Commonly known as: TYLENOL Take 650 mg by mouth every 6 (six) hours as needed for mild pain.   albuterol 108 (90 Base) MCG/ACT inhaler Commonly known as: VENTOLIN HFA Inhale 2 puffs into the lungs every 6 (six) hours as needed for wheezing or shortness of breath.   ALPRAZolam 0.25 MG tablet Commonly known as: XANAX Take 0.25 mg by mouth daily.   Armodafinil 50 MG tablet Take 1 tablet (50 mg total) by mouth as needed.   aspirin EC 81 MG tablet Take 1 tablet (81 mg total) by mouth every evening.   Dialyvite Vitamin D 5000 125 MCG (5000 UT) capsule Generic drug: Cholecalciferol Take 5,000 Units by mouth in the morning.   diphenoxylate-atropine 2.5-0.025 MG tablet Commonly known as: LOMOTIL Take 2 tablets by mouth 4 (four) times daily as needed for diarrhea or loose stools.   FreeStyle Libre 14 Day Sensor Misc SMARTSIG:1 Each Topical Every 2 Weeks   insulin aspart 100 UNIT/ML  injection Commonly known as: novoLOG Inject 5 Units into the skin 3 (three) times daily before meals. Special Instructions: If accu-check is greater than 150. Hold for accu-check 150 or below. With Meals   lactulose 10 GM/15ML solution Commonly known as: CHRONULAC Take 30 mLs (20 g total) by mouth daily.   loratadine 10 MG tablet Commonly known as: CLARITIN Take 10 mg by mouth in the morning.   midodrine 10 MG tablet Commonly known as: PROAMATINE Take 1 tablet (10 mg total) by mouth 3 (three) times daily with meals.   nadolol 20 MG tablet Commonly known as: CORGARD Take 1 tablet (20 mg total) by mouth daily.   nitroGLYCERIN 0.4 MG SL tablet Commonly known as: NITROSTAT Place 1 tablet (0.4 mg total) under the tongue every 5 (five) minutes as needed for chest pain. Do not exceed 3 doses in 15 mins   pantoprazole 40 MG tablet Commonly known as: PROTONIX Take 1 tablet (40 mg total) by mouth daily.   potassium chloride SA 20 MEQ tablet Commonly known as: KLOR-CON M TAKE 1 TABLET(20 MEQ) BY MOUTH DAILY   pravastatin 20 MG tablet Commonly known as: PRAVACHOL TAKE 1 TABLET(20 MG) BY MOUTH AT BEDTIME   spironolactone 100 MG tablet Commonly known as: ALDACTONE Take 1 tablet (100 mg total) by mouth daily. What changed:  medication strength how much to take   torsemide 20 MG tablet Commonly known as: DEMADEX Take 3 tablets (60 mg total) by mouth every morning AND 2 tablets (40 mg total) every evening.   Tyler Aas FlexTouch 200 UNIT/ML FlexTouch Pen Generic drug: insulin degludec Inject 30 Units into the skin See admin instructions. Give 30 units Subcutaneous  every evening   venlafaxine XR 75 MG  24 hr capsule Commonly known as: EFFEXOR-XR Take 1 capsule (75 mg total) by mouth daily with breakfast.        Procedures/Studies: US ABDOMEN LIMITED WITH LIVER DOPPLER  Result Date: 10/22/2022 CLINICAL DATA:  Ascites EXAM: DUPLEX ULTRASOUND OF LIVER TECHNIQUE: Color and  duplex Doppler ultrasound was performed to evaluate the hepatic in-flow and out-flow vessels. COMPARISON:  CT abdomen and pelvis dated 10/21/2022 FINDINGS: Liver: Cirrhotic liver morphology.  No focal lesions. No focal lesion, mass or intrahepatic biliary ductal dilatation. Main Portal Vein size: 0.9 cm Portal Vein Velocities Main Prox:  21.8 cm/sec Main Mid: 21.2 cm/sec Main Dist:  20.9 cm/sec Right: 16.9 cm/sec Left: 15.6 cm/sec Hepatic Vein Velocities Right:  92.7 cm/sec Middle:  16.5 cm/sec Left:  37.9 cm/sec IVC: Present and patent with normal respiratory phasicity. Hepatic Artery Velocity:  54 cm/sec Splenic Vein Velocity:  21.5 cm/sec Spleen: 18.0 cm x 7.7 cm x 16.5 cm with a total volume of 1198.3 cm^3 (411 cm^3 is upper limit normal) Portal Vein Occlusion/Thrombus: No Splenic Vein Occlusion/Thrombus: No Ascites: Large volume ascites. Varices: None Cholelithiasis without acute cholecystitis. Common bile duct measures 4 mm. IMPRESSION: 1. Cirrhotic liver morphology with sequela of portal hypertension (splenomegaly, large volume ascites). 2. Patent hepatic vasculature with appropriate direction of flow. 3. Cholelithiasis without acute cholecystitis. Electronically Signed   By: Darrin Nipper M.D.   On: 10/22/2022 15:03   US Paracentesis  Result Date: 10/21/2022 INDICATION: Ascites NASH Cirrhosis EXAM: ULTRASOUND GUIDED RLQ PARACENTESIS MEDICATIONS: 10 cc 1% lidocaine COMPLICATIONS: None immediate. PROCEDURE: Informed written consent was obtained from the patient after a discussion of the risks, benefits and alternatives to treatment. A timeout was performed prior to the initiation of the procedure. Initial ultrasound scanning demonstrates a large amount of ascites within the right lower abdominal quadrant. The right lower abdomen was prepped and draped in the usual sterile fashion. 1% lidocaine was used for local anesthesia. Following this, a Yueh catheter was introduced. An ultrasound image was saved for  documentation purposes. The paracentesis was performed. The catheter was removed and a dressing was applied. The patient tolerated the procedure well without immediate post procedural complication. Patient received post-procedure intravenous albumin; see nursing notes for details. FINDINGS: A total of approximately 4 liters Max - per protocol of yellow fluid was removed. Samples were sent to the laboratory as requested by the clinical team. IMPRESSION: Successful ultrasound-guided paracentesis yielding 4 liters of peritoneal fluid. Read by Lavonia Drafts [facility] Electronically Signed   By: Lavonia Dana M.D.   On: 10/21/2022 10:07   CT ABDOMEN PELVIS WO CONTRAST  Result Date: 10/21/2022 CLINICAL DATA:  Abdominal pain with difficulty breathing. Increasing abdominal distension and weight gain. EXAM: CT ABDOMEN AND PELVIS WITHOUT CONTRAST TECHNIQUE: Multidetector CT imaging of the abdomen and pelvis was performed following the standard protocol without IV contrast. RADIATION DOSE REDUCTION: This exam was performed according to the departmental dose-optimization program which includes automated exposure control, adjustment of the mA and/or kV according to patient size and/or use of iterative reconstruction technique. COMPARISON:  03/17/2021 FINDINGS: Lower chest: Unremarkable. Hepatobiliary: Nodular liver contour is compatible with cirrhosis. No focal liver lesion evident on this noncontrast study. Layering tiny calcified gallstones evident. No gallbladder wall thickening evident. No intrahepatic or extrahepatic biliary dilation. Pancreas: No focal mass lesion. No dilatation of the main duct. No intraparenchymal cyst. No peripancreatic edema. Spleen: Splenomegaly (estimated volume 1469 cc). Adrenals/Urinary Tract: No adrenal nodule or mass. Kidneys unremarkable. No evidence for hydroureter. The  urinary bladder appears normal for the degree of distention. Stomach/Bowel: Stomach is unremarkable. No gastric wall thickening.  No evidence of outlet obstruction. Duodenum is normally positioned as is the ligament of Treitz. No small bowel wall thickening. No small bowel dilatation. The appendix is not well visualized, but there is no edema or inflammation in the region of the cecum. No gross colonic mass. No colonic wall thickening. Vascular/Lymphatic: There is mild atherosclerotic calcification of the abdominal aorta without aneurysm. Recanalization of the paraumbilical vein evident. There is no gastrohepatic or hepatoduodenal ligament lymphadenopathy. No retroperitoneal or mesenteric lymphadenopathy. No pelvic sidewall lymphadenopathy. Reproductive: The prostate gland and seminal vesicles are unremarkable. Other: Large volume abdominopelvic ascites evident. This is associated with edema in the omentum and mesentery. Body wall edema evident as well. Musculoskeletal: No worrisome lytic or sclerotic osseous abnormality. IMPRESSION: 1. Cirrhotic liver with evidence of portal venous hypertension including splenomegaly and recanalization of the paraumbilical vein. Of the 2. Large volume abdominopelvic ascites with edema in the omentum and mesentery. 3. Cholelithiasis. 4.  Aortic Atherosclerosis (ICD10-I70.0). Electronically Signed   By: Misty Stanley M.D.   On: 10/21/2022 06:11   DG Chest Portable 1 View  Result Date: 10/21/2022 CLINICAL DATA:  Chest pain EXAM: PORTABLE CHEST 1 VIEW COMPARISON:  09/12/2022 FINDINGS: Lungs are clear.  No pleural effusion or pneumothorax. The heart is normal in size. Postsurgical changes related to prior CABG. Median sternotomy. IMPRESSION: No evidence of acute cardiopulmonary disease. Electronically Signed   By: Julian Hy M.D.   On: 10/21/2022 02:45     Subjective: Pt reports feeling like he lost a lot of weight, no SOB and no CP.  He says he will take his diuretics as home as prescribed and follow up as recommended and get his labs checked in 1 week with PCP.   Discharge Exam: Vitals:    10/23/22 0425 10/23/22 0814  BP: 111/64   Pulse: 74   Resp: 19   Temp: 98.1 F (36.7 C)   SpO2: 94% 96%   Vitals:   10/22/22 2014 10/23/22 0425 10/23/22 0500 10/23/22 0814  BP: 116/65 111/64    Pulse: 68 74    Resp: 18 19    Temp: 98.5 F (36.9 C) 98.1 F (36.7 C)    TempSrc: Axillary Oral    SpO2: 100% 94%  96%  Weight:   116 kg   Height:       General: Pt is alert, awake, not in acute distress Cardiovascular: normal S1/S2 +, no rubs, no gallops Respiratory: CTA bilaterally, no wheezing, no rhonchi Abdominal: Soft, obese, NT, ND, bowel sounds + Extremities: no pretibial edema, no cyanosis   The results of significant diagnostics from this hospitalization (including imaging, microbiology, ancillary and laboratory) are listed below for reference.     Microbiology: Recent Results (from the past 240 hour(s))  Gram stain     Status: None   Collection Time: 10/21/22  8:45 AM   Specimen: Peritoneal Washings  Result Value Ref Range Status   Specimen Description PERITONEAL  Final   Special Requests PERITONEAL  Final   Gram Stain   Final    NO ORGANISMS SEEN WBC PRESENT,BOTH PMN AND MONONUCLEAR CYTOSPIN SMEAR Performed at [facility], [address], [place], [place] [address]    Report Status 10/21/2022 FINAL  Final  Culture, body fluid w Gram Stain-bottle     Status: None (Preliminary result)   Collection Time: 10/21/22  8:45 AM   Specimen: Peritoneal Washings  Result  Value Ref Range Status   Specimen Description PERITONEAL  Final   Special Requests 10CC BOTTLES DRAWN AEROBIC AND ANAEROBIC  Final   Culture   Final    NO GROWTH 2 DAYS Performed at [facility], [address], [place], [place] [address]    Report Status PENDING  Incomplete     Labs: BNP (last 3 results) Recent Labs    06/24/22 1701 09/12/22 1812 09/20/22 1107  BNP 74.0 136.0* 841.6*   Basic Metabolic Panel: Recent Labs  Lab 10/21/22 0259 10/22/22 0413 10/23/22 0809  NA 134* 136 134*  K  4.0 3.5 3.8  CL 99 102 101  CO2 27 26 27   GLUCOSE 164* 172* 190*  BUN 19 16 17   CREATININE 1.36* 1.13 1.15  CALCIUM 8.5* 8.3* 8.1*  MG  --  2.1  --    Liver Function Tests: Recent Labs  Lab 10/21/22 1510 10/22/22 0413  AST 32 29  ALT 11 10  ALKPHOS 62 56  BILITOT 1.9* 1.9*  PROT 6.8 6.6  ALBUMIN 2.8* 2.8*   No results for input(s): "LIPASE", "AMYLASE" in the last 168 hours. No results for input(s): "AMMONIA" in the last 168 hours. CBC: Recent Labs  Lab 10/21/22 0259  WBC 4.6  HGB 11.4*  HCT 33.1*  MCV 94.6  PLT 131*   Cardiac Enzymes: No results for input(s): "CKTOTAL", "CKMB", "CKMBINDEX", "TROPONINI" in the last 168 hours. BNP: Invalid input(s): "POCBNP" CBG: Recent Labs  Lab 10/22/22 1157 10/22/22 1636 10/22/22 2214 10/23/22 0745 10/23/22 1113  GLUCAP 137* 228* 334* 182* 245*   D-Dimer No results for input(s): "DDIMER" in the last 72 hours. Hgb A1c Recent Labs    10/21/22 1130  HGBA1C 5.6   Lipid Profile Recent Labs    10/22/22 0413  CHOL 105  HDL 31*  LDLCALC 62  TRIG 61  CHOLHDL 3.4   Thyroid function studies No results for input(s): "TSH", "T4TOTAL", "T3FREE", "THYROIDAB" in the last 72 hours.  Invalid input(s): "FREET3" Anemia work up No results for input(s): "VITAMINB12", "FOLATE", "FERRITIN", "TIBC", "IRON", "RETICCTPCT" in the last 72 hours. Urinalysis    Component Value Date/Time   COLORURINE YELLOW 06/24/2022 1841   APPEARANCEUR CLEAR 06/24/2022 1841   LABSPEC 1.020 06/24/2022 1841   PHURINE 7.0 06/24/2022 1841   GLUCOSEU >=500 (A) 06/24/2022 1841   HGBUR NEGATIVE 06/24/2022 1841   BILIRUBINUR NEGATIVE 06/24/2022 1841   KETONESUR NEGATIVE 06/24/2022 1841   PROTEINUR NEGATIVE 06/24/2022 1841   UROBILINOGEN 1.0 08/28/2015 1634   NITRITE NEGATIVE 06/24/2022 1841   LEUKOCYTESUR TRACE (A) 06/24/2022 1841   Sepsis Labs Recent Labs  Lab 10/21/22 0259  WBC 4.6   Microbiology Recent Results (from the past 240 hour(s))   Gram stain     Status: None   Collection Time: 10/21/22  8:45 AM   Specimen: Peritoneal Washings  Result Value Ref Range Status   Specimen Description PERITONEAL  Final   Special Requests PERITONEAL  Final   Gram Stain   Final    NO ORGANISMS SEEN WBC PRESENT,BOTH PMN AND MONONUCLEAR CYTOSPIN SMEAR Performed at [facility], [address], [place], [place] [address]    Report Status 10/21/2022 FINAL  Final  Culture, body fluid w Gram Stain-bottle     Status: None (Preliminary result)   Collection Time: 10/21/22  8:45 AM   Specimen: Peritoneal Washings  Result Value Ref Range Status   Specimen Description PERITONEAL  Final   Special Requests 10CC BOTTLES DRAWN AEROBIC AND ANAEROBIC  Final   Culture   Final    NO GROWTH 2 DAYS Performed at [facility], [address] [place], [place] [address]    Report Status PENDING  Incomplete   Time coordinating discharge: 41 mins   SIGNED:  Irwin Brakeman, MD  [facility] 10/23/2022, 11:51 AM How to contact the [place] [facility] Attending or Consulting provider [place] or covering provider during after hours [place], for this patient?  Check the care team in [facility] and look for a) attending/consulting [facility] provider listed and b) the [facility] team listed Log into [email or web address] and use [place]'s universal password to access. If you do not have the password, please contact the hospital operator. Locate the [facility] provider you are looking for under [facility] and page to a number that you can be directly reached. If you still have difficulty reaching the provider, please page the [facility] (Director on Call) for the Hospitalists listed on amion for assistance.

## 2022-10-23 NOTE — Progress Notes (Signed)
IV removed and discharge instructions reviewed.  Scripts sent to pharmacy and to make follow up appointment with primary MD.  Has called wife for ride home and she is on her way.

## 2022-10-23 NOTE — Discharge Instructions (Addendum)
PLEASE GO TO [facility] NEXT MONTH AS SCHEDULED  PLEASE TAKE YOUR DIURETICS TO KEEP THE FLUID OFF AS MUCH AS POSSIBLE   PLEASE HAVE LABS CHECKED WITH PRIMARY CARE PROVIDER IN 1 WEEK    IMPORTANT INFORMATION: PAY CLOSE ATTENTION   PHYSICIAN DISCHARGE INSTRUCTIONS  Follow with Primary care provider  [name], MD  and other consultants as instructed by your Hospitalist Physician  [place] IF SYMPTOMS COME BACK, WORSEN OR NEW PROBLEM DEVELOPS   Please note: You were cared for by a hospitalist during your hospital stay. Every effort will be made to forward records to your primary care provider.  You can request that your primary care provider send for your hospital records if they have not received them.  Once you are discharged, your primary care physician will handle any further medical issues. Please note that NO REFILLS for any discharge medications will be authorized once you are discharged, as it is imperative that you return to your primary care physician (or establish a relationship with a primary care physician if you do not have one) for your post hospital discharge needs so that they can reassess your need for medications and monitor your lab values.  Please get a complete blood count and chemistry panel checked by your Primary MD at your next visit, and again as instructed by your Primary MD.  Get Medicines reviewed and adjusted: Please take all your medications with you for your next visit with your Primary MD  Laboratory/radiological data: Please request your Primary MD to go over all hospital tests and procedure/radiological results at the follow up, please ask your primary care provider to get all Hospital records sent to his/her office.  In some cases, they will be blood work, cultures and biopsy results pending at the time of your discharge. Please request that your primary care provider follow up on these results.  If you are  diabetic, please bring your blood sugar readings with you to your follow up appointment with primary care.    Please call and make your follow up appointments as soon as possible.    Also Note the following: If you experience worsening of your admission symptoms, develop shortness of breath, life threatening emergency, suicidal or homicidal thoughts you must seek medical attention immediately by calling 911 or calling your MD immediately  if symptoms less severe.  You must read complete instructions/literature along with all the possible adverse reactions/side effects for all the Medicines you take and that have been prescribed to you. Take any new Medicines after you have completely understood and accpet all the possible adverse reactions/side effects.   Do not drive when taking Pain medications or sleeping medications (Benzodiazepines)  Do not take more than prescribed Pain, Sleep and Anxiety Medications. It is not advisable to combine anxiety,sleep and pain medications without talking with your primary care practitioner  Special Instructions: If you have smoked or chewed Tobacco  in the last 2 yrs please stop smoking, stop any regular Alcohol  and or any Recreational drug use.  Wear Seat belts while driving.  Do not drive if taking any narcotic, mind altering or controlled substances or recreational drugs or alcohol.

## 2022-10-23 NOTE — Progress Notes (Signed)
No CP/SHOB this shift.  NSR on tele.  Slept well last noc with prn xanax, pt forgot to place on CPAP but showed no apnea on monitor.

## 2022-10-23 NOTE — Progress Notes (Signed)
Nsg Discharge Note  Admit Date:  10/21/2022 Discharge date: 10/23/2022   [name] to be D/C'd Home per MD order.  AVS completed.   Patient/caregiver able to verbalize understanding.  Discharge Medication: Allergies as of 10/23/2022       Reactions   Fluticasone Rash        Medication List     STOP taking these medications    budesonide-formoterol 160-4.5 MCG/ACT inhaler Commonly known as: SYMBICORT   cephALEXin 500 MG capsule Commonly known as: Keflex   ciprofloxacin 500 MG tablet Commonly known as: CIPRO   Combivent Respimat 20-100 MCG/ACT Aers respimat Generic drug: Ipratropium-Albuterol   Comirnaty Susp injection Generic drug: COVID-19 mRNA Vac-TriS (Pf[employer])   ferrous sulfate 325 (65 FE) MG tablet   Fluarix Quadrivalent 0.5 ML injection Generic drug: influenza vac split quadrivalent PF   magnesium oxide 400 (240 Mg) MG tablet Commonly known as: MAG-OX   Ozempic (0.25 or 0.5 MG/DOSE) 2 MG/1.5ML Sopn Generic drug: Semaglutide(0.25 or 0.5MG/DOS)   propranolol 40 MG tablet Commonly known as: INDERAL   traMADol 50 MG tablet Commonly known as: ULTRAM       TAKE these medications    acetaminophen 325 MG tablet Commonly known as: TYLENOL Take 650 mg by mouth every 6 (six) hours as needed for mild pain.   albuterol 108 (90 Base) MCG/ACT inhaler Commonly known as: VENTOLIN HFA Inhale 2 puffs into the lungs every 6 (six) hours as needed for wheezing or shortness of breath.   ALPRAZolam 0.25 MG tablet Commonly known as: XANAX Take 0.25 mg by mouth daily.   Armodafinil 50 MG tablet Take 1 tablet (50 mg total) by mouth as needed.   aspirin EC 81 MG tablet Take 1 tablet (81 mg total) by mouth every evening.   Dialyvite Vitamin D 5000 125 MCG (5000 UT) capsule Generic drug: Cholecalciferol Take 5,000 Units by mouth in the morning.   diphenoxylate-atropine 2.5-0.025 MG tablet Commonly known as: LOMOTIL Take 2 tablets by mouth 4 (four)  times daily as needed for diarrhea or loose stools.   FreeStyle Libre 14 Day Sensor Misc SMARTSIG:1 Each Topical Every 2 Weeks   insulin aspart 100 UNIT/ML injection Commonly known as: novoLOG Inject 5 Units into the skin 3 (three) times daily before meals. Special Instructions: If accu-check is greater than 150. Hold for accu-check 150 or below. With Meals   lactulose 10 GM/15ML solution Commonly known as: CHRONULAC Take 30 mLs (20 g total) by mouth daily.   loratadine 10 MG tablet Commonly known as: CLARITIN Take 10 mg by mouth in the morning.   midodrine 10 MG tablet Commonly known as: PROAMATINE Take 1 tablet (10 mg total) by mouth 3 (three) times daily with meals.   nadolol 20 MG tablet Commonly known as: CORGARD Take 1 tablet (20 mg total) by mouth daily.   nitroGLYCERIN 0.4 MG SL tablet Commonly known as: NITROSTAT Place 1 tablet (0.4 mg total) under the tongue every 5 (five) minutes as needed for chest pain. Do not exceed 3 doses in 15 mins   pantoprazole 40 MG tablet Commonly known as: PROTONIX Take 1 tablet (40 mg total) by mouth daily.   potassium chloride SA 20 MEQ tablet Commonly known as: KLOR-CON M TAKE 1 TABLET(20 MEQ) BY MOUTH DAILY   pravastatin 20 MG tablet Commonly known as: PRAVACHOL TAKE 1 TABLET(20 MG) BY MOUTH AT BEDTIME   spironolactone 100 MG tablet Commonly known as: ALDACTONE Take 1 tablet (100 mg total) by mouth  daily. What changed:  medication strength how much to take   torsemide 20 MG tablet Commonly known as: DEMADEX Take 3 tablets (60 mg total) by mouth every morning AND 2 tablets (40 mg total) every evening.   Tyler Aas FlexTouch 200 UNIT/ML FlexTouch Pen Generic drug: insulin degludec Inject 30 Units into the skin See admin instructions. Give 30 units Subcutaneous  every evening   venlafaxine XR 75 MG 24 hr capsule Commonly known as: EFFEXOR-XR Take 1 capsule (75 mg total) by mouth daily with breakfast.        Discharge  Assessment: Vitals:   10/23/22 0425 10/23/22 0814  BP: 111/64   Pulse: 74   Resp: 19   Temp: 98.1 F (36.7 C)   SpO2: 94% 96%   Skin clean, dry and intact without evidence of skin break down, no evidence of skin tears noted. IV catheter discontinued intact. Site without signs and symptoms of complications - no redness or edema noted at insertion site, patient denies c/o pain - only slight tenderness at site.  Dressing with slight pressure applied.  D/c Instructions-Education: Discharge instructions given to patient/family with verbalized understanding. D/c education completed with patient/family including follow up instructions, medication list, d/c activities limitations if indicated, with other d/c instructions as indicated by MD - patient able to verbalize understanding, all questions fully answered. Patient instructed to return to ED, call 911, or call MD for any changes in condition.  Patient escorted via [place], and D/C home via private auto.  Kathie Rhodes, RN 10/23/2022 12:06 PM

## 2022-10-26 LAB — CULTURE, BODY FLUID W GRAM STAIN -BOTTLE: Culture: NO GROWTH

## 2022-10-28 DIAGNOSIS — I959 Hypotension, unspecified: Secondary | ICD-10-CM | POA: Diagnosis not present

## 2022-10-28 DIAGNOSIS — S0083XA Contusion of other part of head, initial encounter: Secondary | ICD-10-CM | POA: Diagnosis not present

## 2022-10-28 DIAGNOSIS — W19XXXA Unspecified fall, initial encounter: Secondary | ICD-10-CM | POA: Diagnosis not present

## 2022-10-28 DIAGNOSIS — S80212A Abrasion, left knee, initial encounter: Secondary | ICD-10-CM | POA: Diagnosis not present

## 2022-10-28 DIAGNOSIS — S0003XA Contusion of scalp, initial encounter: Secondary | ICD-10-CM | POA: Diagnosis not present

## 2022-10-28 DIAGNOSIS — Z043 Encounter for examination and observation following other accident: Secondary | ICD-10-CM | POA: Diagnosis not present

## 2022-10-28 DIAGNOSIS — J019 Acute sinusitis, unspecified: Secondary | ICD-10-CM | POA: Diagnosis not present

## 2022-10-28 DIAGNOSIS — R58 Hemorrhage, not elsewhere classified: Secondary | ICD-10-CM | POA: Diagnosis not present

## 2022-10-28 DIAGNOSIS — T07XXXA Unspecified multiple injuries, initial encounter: Secondary | ICD-10-CM | POA: Diagnosis not present

## 2022-10-28 DIAGNOSIS — M7981 Nontraumatic hematoma of soft tissue: Secondary | ICD-10-CM | POA: Diagnosis not present

## 2022-10-28 DIAGNOSIS — R52 Pain, unspecified: Secondary | ICD-10-CM | POA: Diagnosis not present

## 2022-10-28 DIAGNOSIS — W1830XA Fall on same level, unspecified, initial encounter: Secondary | ICD-10-CM | POA: Diagnosis not present

## 2022-10-28 DIAGNOSIS — S0993XA Unspecified injury of face, initial encounter: Secondary | ICD-10-CM | POA: Diagnosis not present

## 2022-10-28 DIAGNOSIS — M25552 Pain in left hip: Secondary | ICD-10-CM | POA: Diagnosis not present

## 2022-10-28 DIAGNOSIS — S20212A Contusion of left front wall of thorax, initial encounter: Secondary | ICD-10-CM | POA: Diagnosis not present

## 2022-10-28 DIAGNOSIS — R079 Chest pain, unspecified: Secondary | ICD-10-CM | POA: Diagnosis not present

## 2022-10-28 DIAGNOSIS — S8002XA Contusion of left knee, initial encounter: Secondary | ICD-10-CM | POA: Diagnosis not present

## 2022-10-29 ENCOUNTER — Ambulatory Visit: Payer: Federal, State, Local not specified - PPO | Admitting: Orthopedic Surgery

## 2022-11-02 ENCOUNTER — Telehealth: Payer: Self-pay | Admitting: Cardiology

## 2022-11-02 ENCOUNTER — Telehealth: Payer: Self-pay | Admitting: *Deleted

## 2022-11-02 NOTE — Telephone Encounter (Signed)
Pt wife called in regarding concern for worsening cough and fluid retention. Reports patient fell on 11/17 and was seen in the ED at [facility], cleared for DC home. Also with recent admission for CHF with abd acsites requiring paracentesis. Reports his abd is mildly swollen but better than when he was admitted, mostly concerned regarding his deep congested cough. Weight is slightly up. No fevers. Urine output has been ok. I advised ok to take additional torsemide 68m later this morning after receiving his usual 637mdose now and follow UOP/symptoms. If no significant improvement later today then would be evaluated in the closest ED. She voiced understanding and thanked me for callback.

## 2022-11-02 NOTE — Telephone Encounter (Signed)
Patient had PREP initial assessment visit scheduled for this morning at 1000. When he did not arrive I sent a text message. He informed me of his recent fall. PREP class is to begin on 11/09/22. I asked [name] to see how he was feeling next week and let me know if we need to delay to a future PREP class. I have asked him to contact me once he determines what is best for him.

## 2022-11-08 ENCOUNTER — Telehealth: Payer: Self-pay | Admitting: Cardiology

## 2022-11-08 MED ORDER — TORSEMIDE 20 MG PO TABS: 60.0000 mg | ORAL_TABLET | Freq: Two times a day (BID) | ORAL | 3 refills | Status: DC

## 2022-11-08 NOTE — Telephone Encounter (Signed)
Pt notified and instructed to increase Torsemide to 60 mg Two Times Daily. Pt agrees with plan of care.

## 2022-11-08 NOTE — Telephone Encounter (Signed)
Pt c/o medication issue:  1. Name of Medication: torsemide (DEMADEX) 20 MG tablet   2. How are you currently taking this medication (dosage and times per day)? Increased from 1 tablet daily, 1 tablet 3 times in the morning and 2 times in the afternoon  3. Are you having a reaction (difficulty breathing--STAT)? no  4. What is your medication issue? Patient states the medication was increased, but it has not made a difference.

## 2022-11-08 NOTE — Telephone Encounter (Signed)
Spoke with pt who states that he continues to have swelling in his belly. Pt reports taking 3 tablets in the am and taking 2 tablets in the pm. He denies swelling to the lower extremities, and SOB. Current weights are 254 lbs on 11/27, 255 lbs on 11/26, 255 lbs on 11/25, 256 lbs 11/24. Please advise.

## 2022-11-10 DIAGNOSIS — E1129 Type 2 diabetes mellitus with other diabetic kidney complication: Secondary | ICD-10-CM | POA: Diagnosis not present

## 2022-11-10 DIAGNOSIS — I25118 Atherosclerotic heart disease of native coronary artery with other forms of angina pectoris: Secondary | ICD-10-CM | POA: Diagnosis not present

## 2022-11-10 DIAGNOSIS — I350 Nonrheumatic aortic (valve) stenosis: Secondary | ICD-10-CM | POA: Diagnosis not present

## 2022-11-10 DIAGNOSIS — I5030 Unspecified diastolic (congestive) heart failure: Secondary | ICD-10-CM | POA: Diagnosis not present

## 2022-11-10 IMAGING — CT CT L SPINE W/O CM
3 series · 11 of 33 positions shown, 13 images · non-contrast
Comparison: None available.

CLINICAL DATA: Initial evaluation for acute severe lower back pain.

EXAM:
CT LUMBAR SPINE WITHOUT CONTRAST
TECHNIQUE: Multidetector CT imaging of the lumbar spine was performed without
intravenous contrast administration. Multiplanar CT image
reconstructions were also generated.

[Series 7: axial tissue spine · axial · 0.30mm/px · z∈[-563,-387]mm · 3 of 144 slices shown, 4 images]
[im 34/144  soft-tissue]
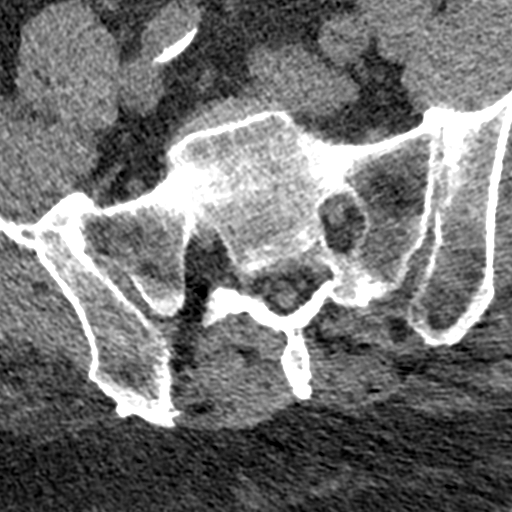
[im 34/144  bone]
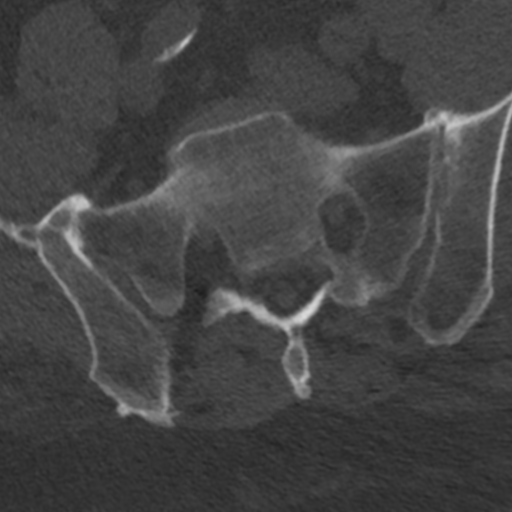
[im 78/144  bone]
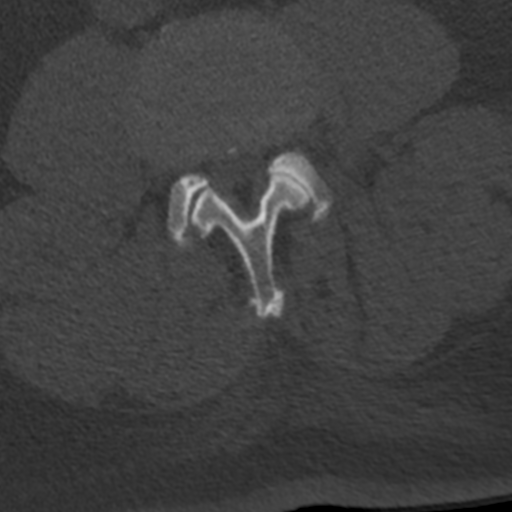
[im 122/144  bone]
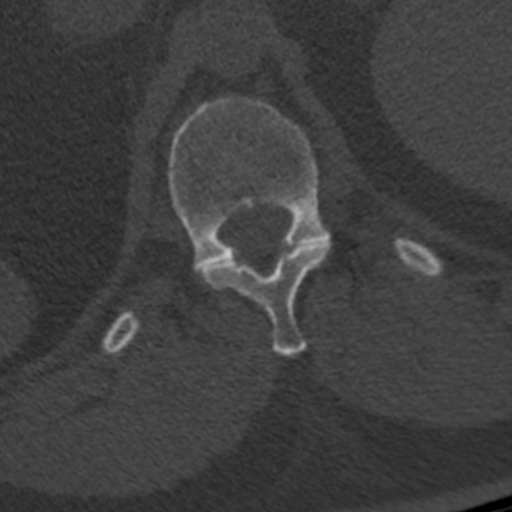

[Series 10: sagital bone spine · sagittal · 0.31mm/px · 5 of 79 slices shown, 6 images]
[im 27/79  bone]
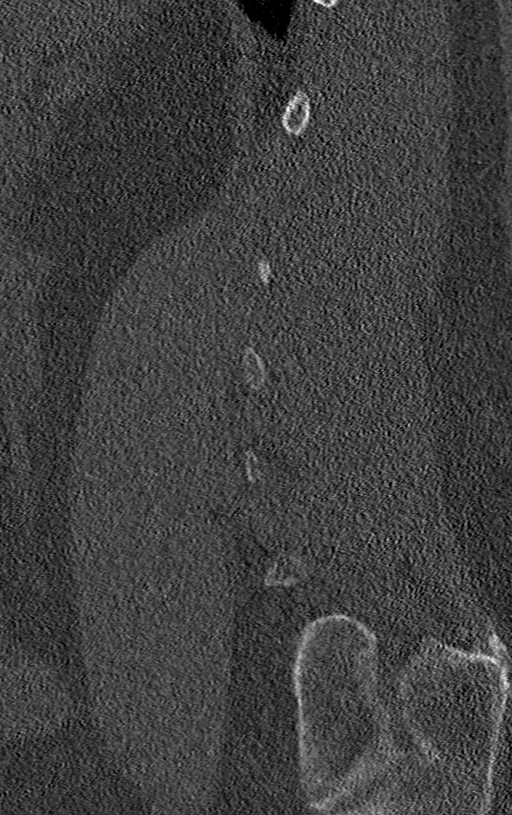
[im 33/79  bone]
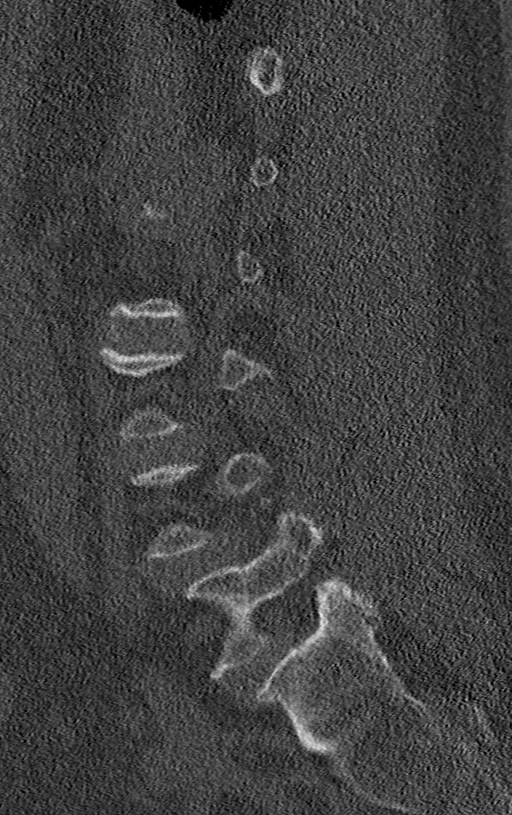
[im 40/79  soft-tissue]
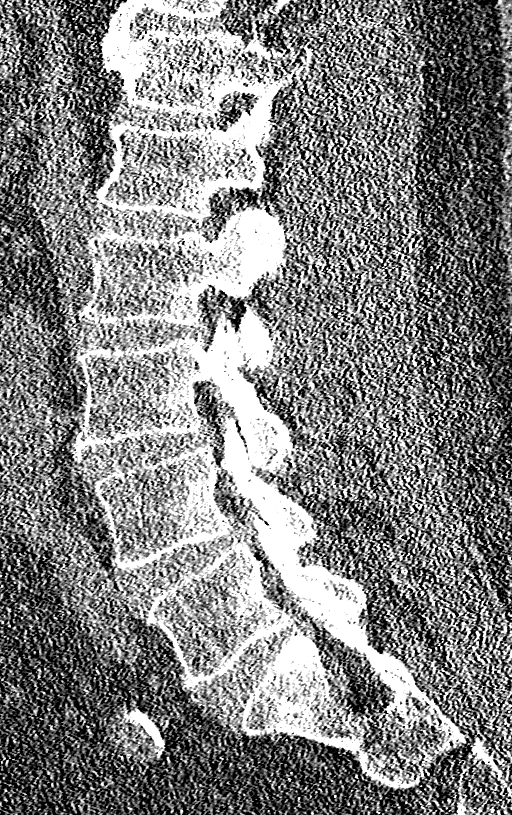
[im 40/79  bone]
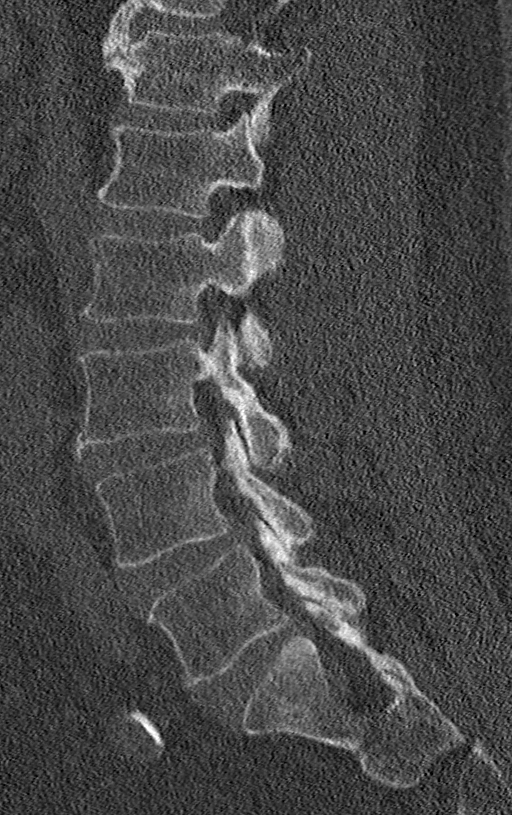
[im 46/79  bone]
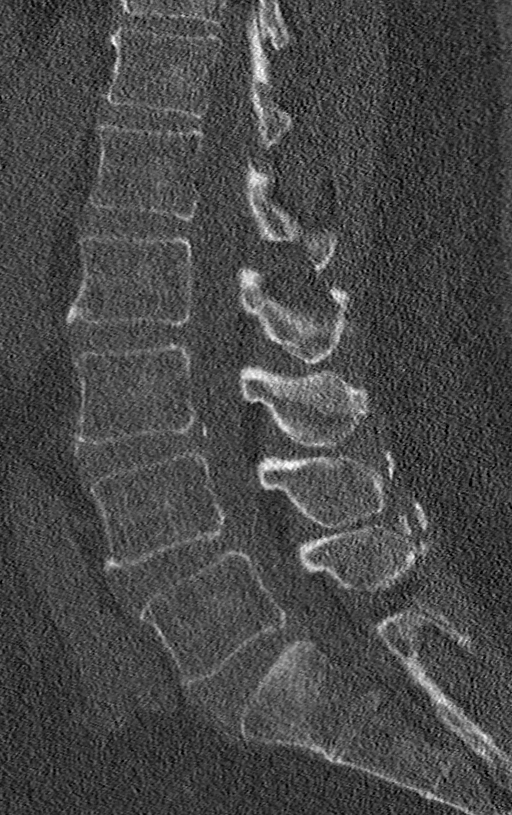
[im 53/79  bone]
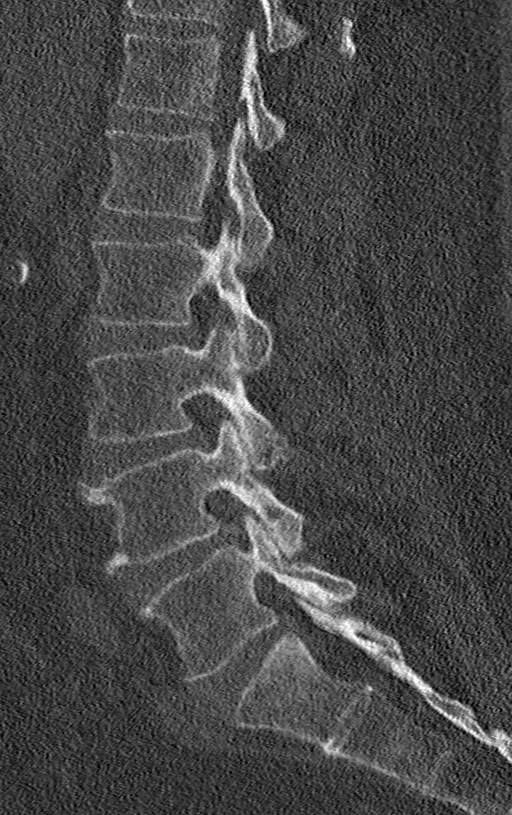

[Series 11: coronal tissue spine · coronal · 0.38mm/px · 3 of 76 slices shown]
[im 16/76  bone]
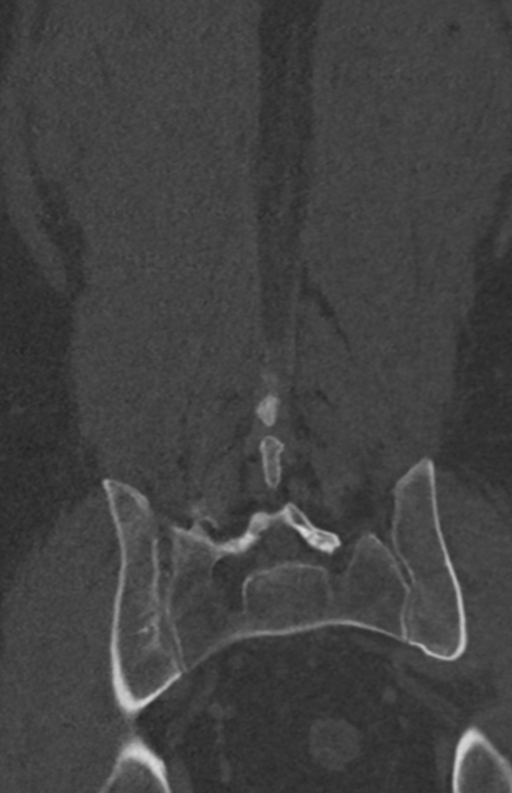
[im 31/76  bone]
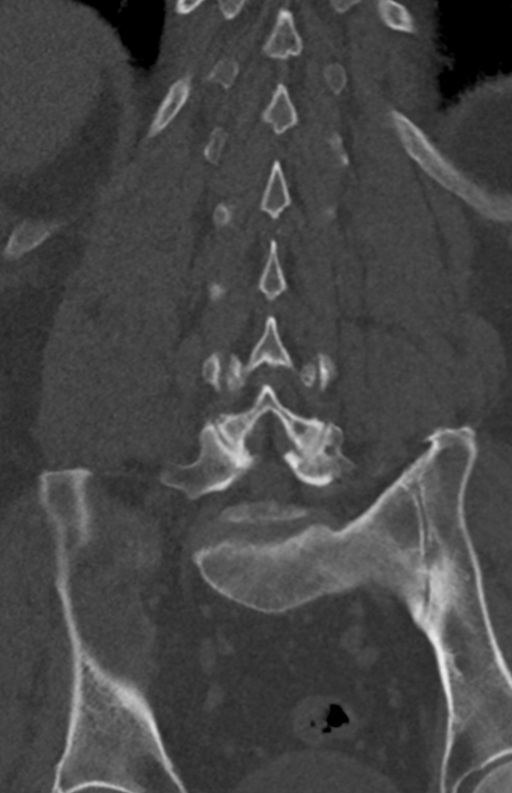
[im 46/76  bone]
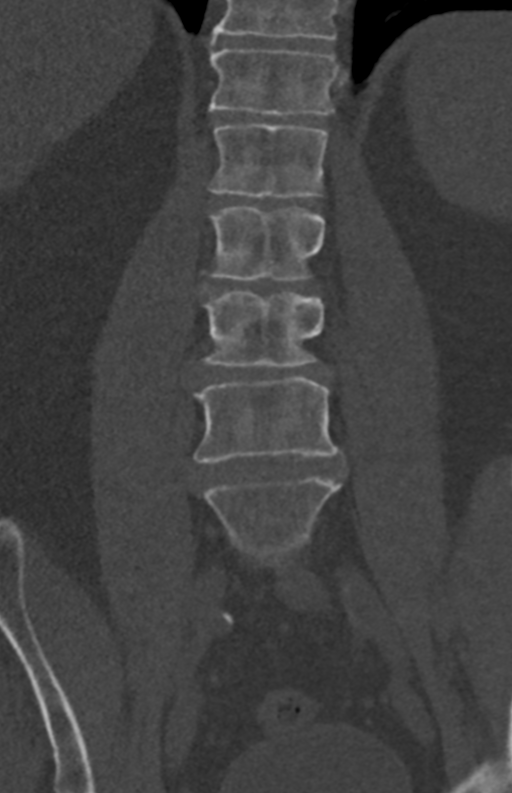

[11 of 33 positions shown; findings below may reference images not displayed]

FINDINGS: Segmentation: Standard. Lowest well-formed disc space labeled the
L5-S1 level.

Alignment: Trace anterolisthesis of L4 on L5, chronic and facet
mediated. Alignment otherwise normal preservation of the normal
lumbar lordosis.

Vertebrae: Vertebral body height maintained without evidence for
acute or chronic fracture. Visualized sacrum and pelvis intact. SI
joints approximated symmetric. No discrete or worrisome osseous
lesions.

Paraspinal and other soft tissues: Paraspinous soft tissues
demonstrate no acute finding.

Disc levels:

L1-2:  Unremarkable.

L2-3: Mild disc bulge, slightly eccentric to the right. No spinal
stenosis. Foramina remain patent.

L3-4: Mild diffuse disc bulge with annular calcification.
Superimposed right foraminal to extraforaminal disc protrusion
closely approximates and potentially affects the exiting right L3
nerve root (series 7, image 66). Superimposed mild facet
hypertrophy. No more than mild spinal stenosis. Mild bilateral L3
foraminal narrowing.

L4-5: Trace anterolisthesis. Diffuse disc bulge, slightly asymmetric
to the right. Mild to moderate facet and ligament flavum
hypertrophy. Resultant moderate canal with bilateral subarticular
stenosis. Moderate right worse than left L4 foraminal narrowing.

L5-S1: Mild disc bulge. Superimposed left foraminal disc protrusion
closely approximates and could potentially affect the exiting left
L5 nerve root (series 7, image 97). Mild to moderate bilateral facet
hypertrophy. No significant canal or lateral recess stenosis. Mild
right with moderate left L5 foraminal narrowing.
IMPRESSION: 1. No acute osseous abnormality within the lumbar spine.
2. Trace anterolisthesis of L4 on L5 with associated disc bulge and
facet hypertrophy, resulting in moderate canal with bilateral
subarticular stenosis.
3. Right foraminal to extraforaminal disc protrusion at L3-4,
closely approximating and potentially affecting the exiting right L3
nerve root.
4. Left foraminal disc protrusion at L5-S1, potentially affecting
the exiting left L5 nerve root.
5. Mild-to-moderate lower lumbar facet arthropathy. Finding could
contribute to lower back pain.

## 2022-11-10 IMAGING — CT CT RENAL STONE PROTOCOL
2 of 4 series · 17 of 46 positions shown, 19 images · non-contrast
Comparison: 05/16/2016

CLINICAL DATA: Severe low back pain and neck pain.  No injury.

EXAM:
CT ABDOMEN AND PELVIS WITHOUT CONTRAST
TECHNIQUE: Multidetector CT imaging of the abdomen and pelvis was performed
following the standard protocol without IV contrast.

[Series 2: axial st · axial · 1.27mm/px · z∈[-705,-255]mm · 14 of 103 slices shown, 16 images]
[im 7/103  soft-tissue]
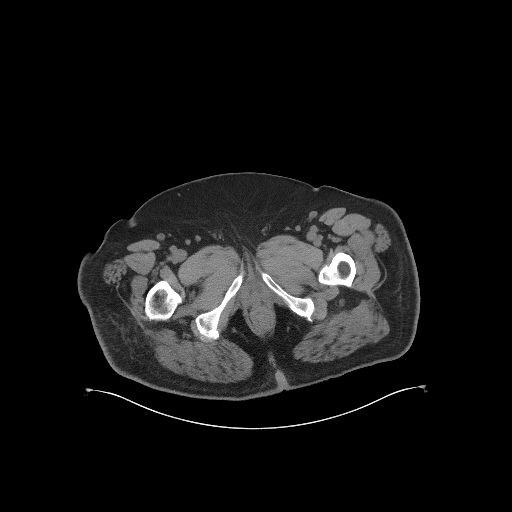
[im 7/103  bone]
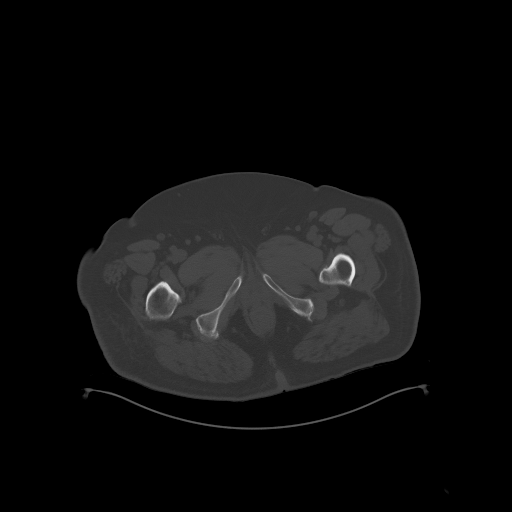
[im 13/103  soft-tissue]
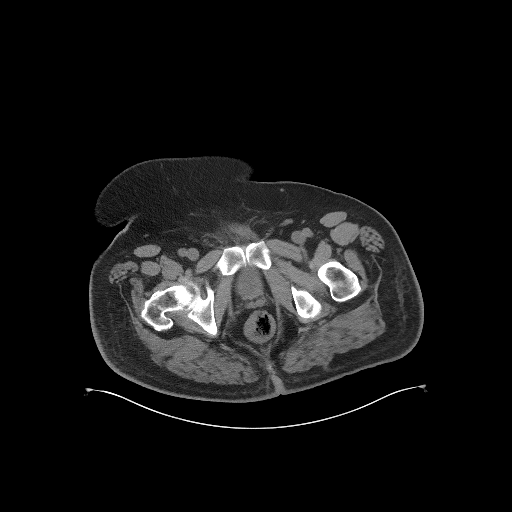
[im 19/103  soft-tissue]
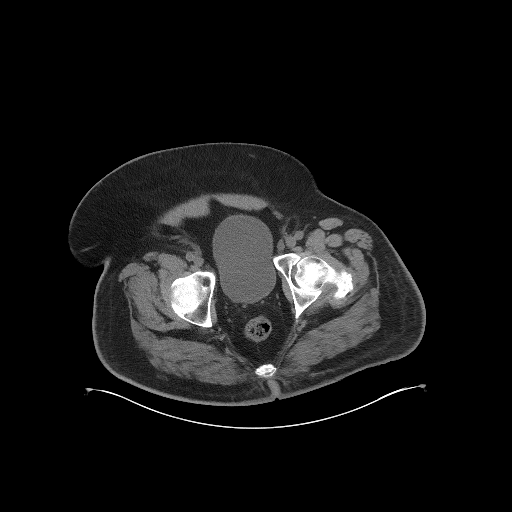
[im 31/103  soft-tissue]
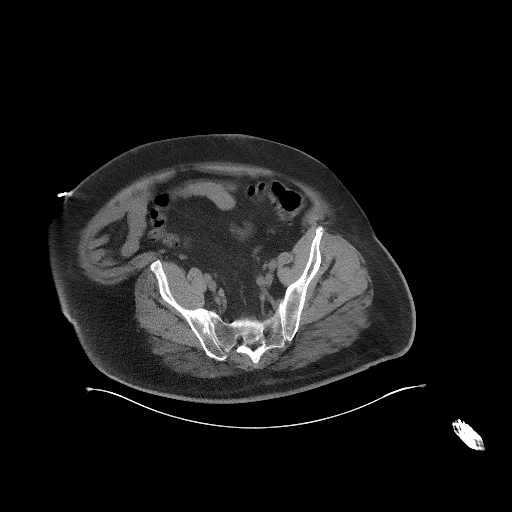
[im 37/103  soft-tissue]
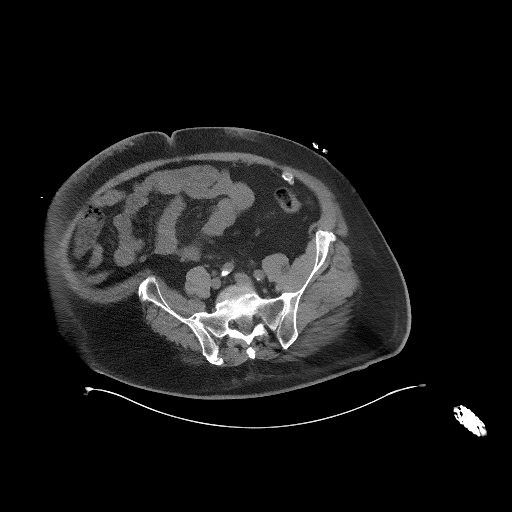
[im 43/103  soft-tissue]
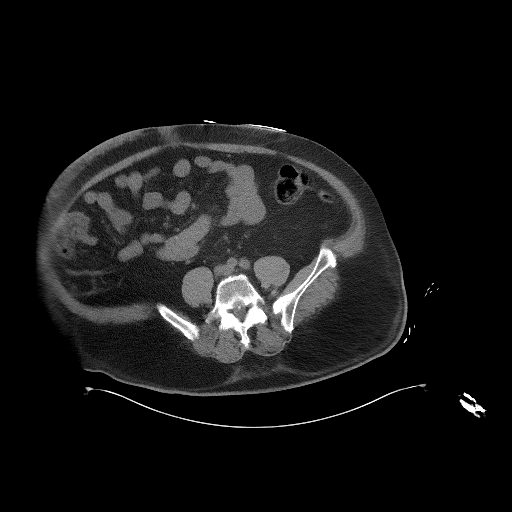
[im 49/103  soft-tissue]
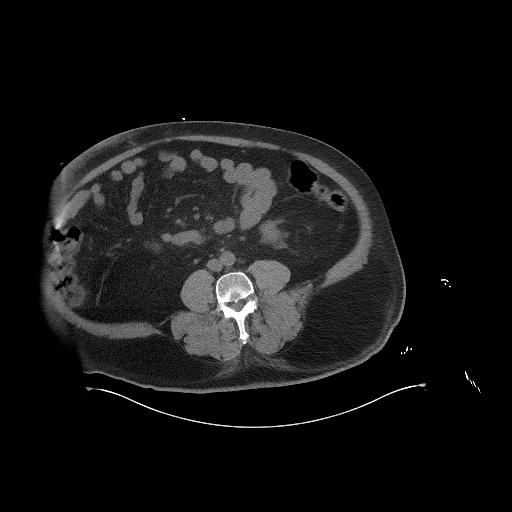
[im 55/103  soft-tissue]
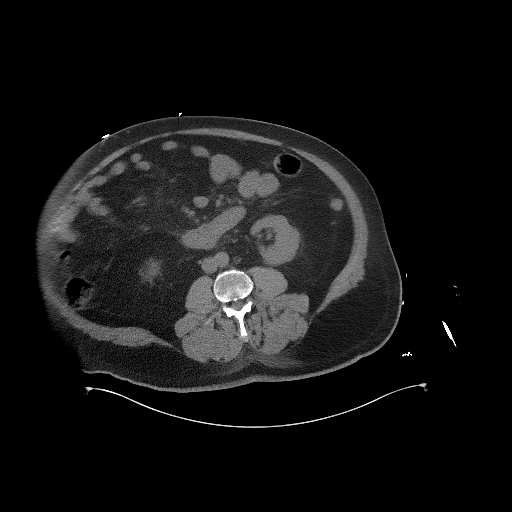
[im 61/103  soft-tissue]
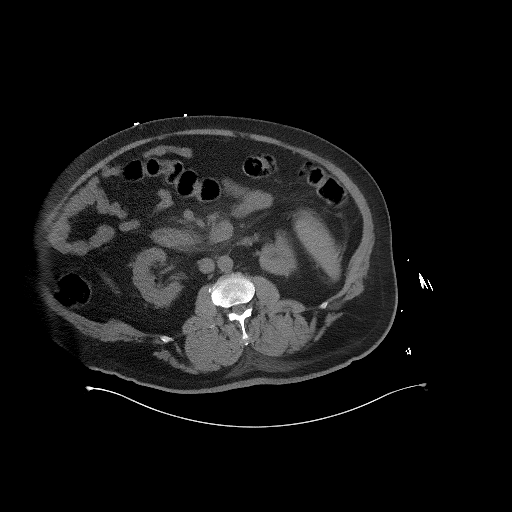
[im 61/103  bone]
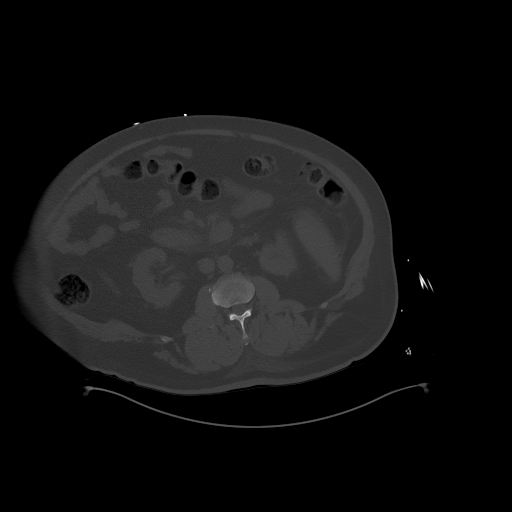
[im 67/103  soft-tissue]
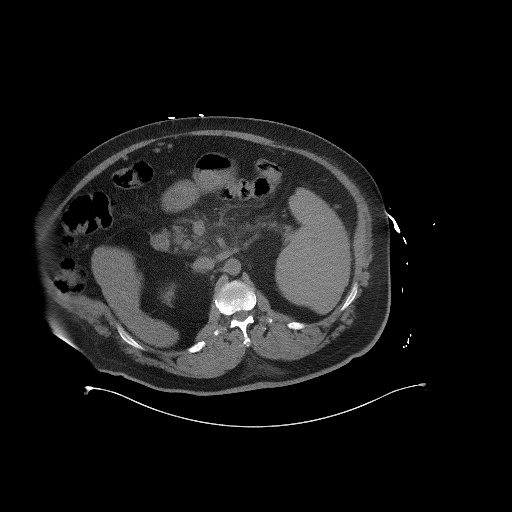
[im 79/103  soft-tissue]
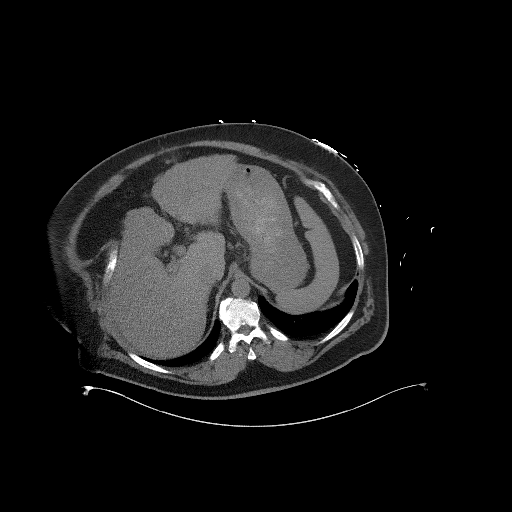
[im 85/103  soft-tissue]
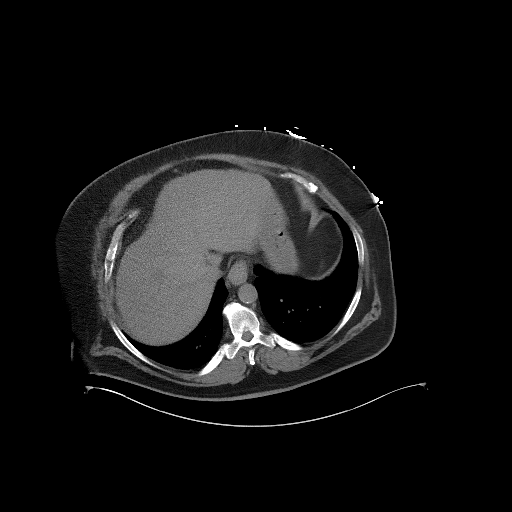
[im 91/103  soft-tissue]
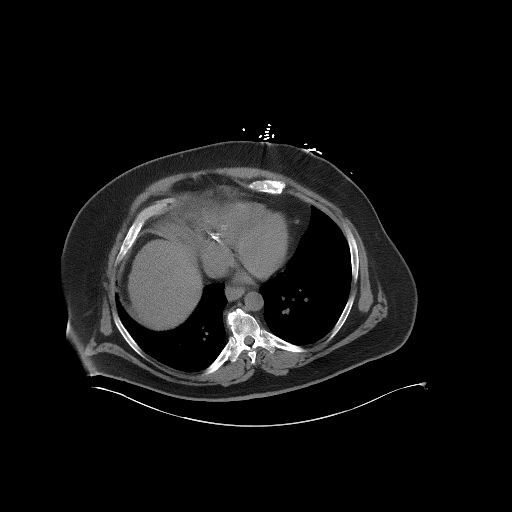
[im 97/103  soft-tissue]
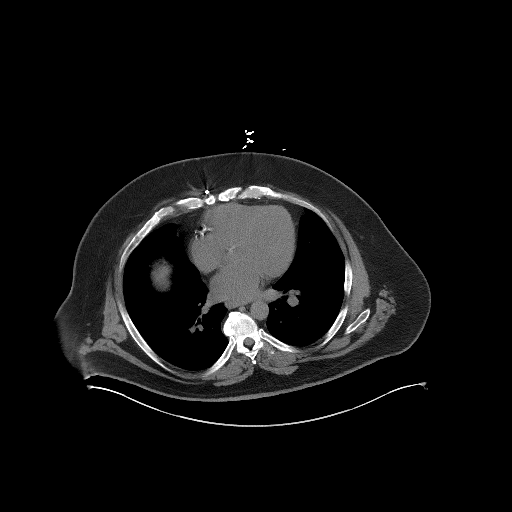

[Series 5: coronal st · coronal · 0.97mm/px · 3 of 117 slices shown]
[im 39/117  soft-tissue]
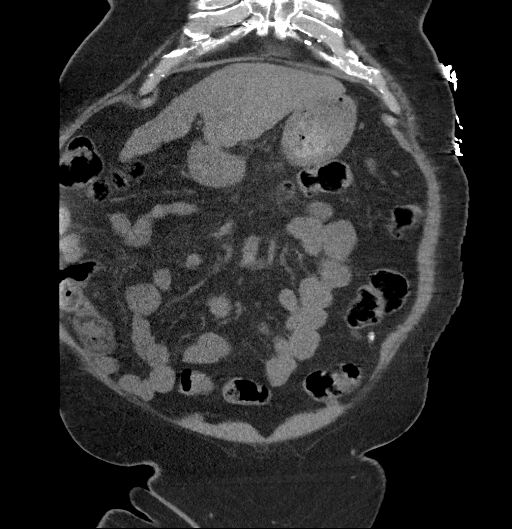
[im 52/117  soft-tissue]
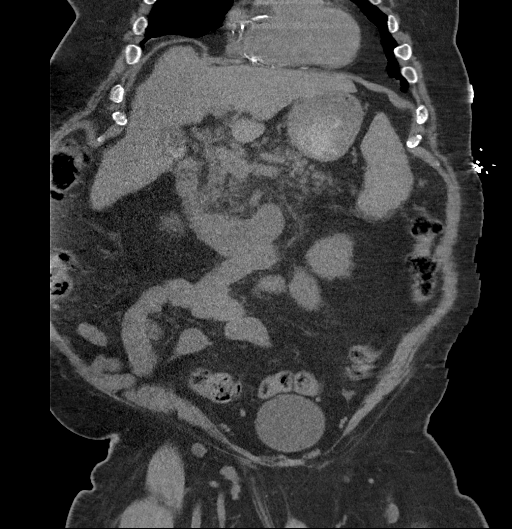
[im 65/117  soft-tissue]
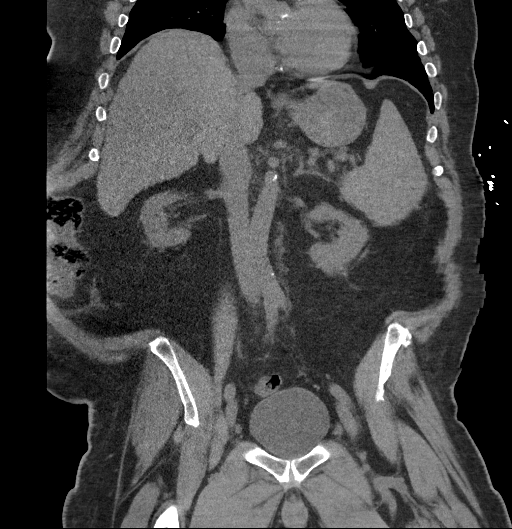

[17 of 46 positions shown; findings below may reference images not displayed]

FINDINGS: Lower chest: Previous sternotomy.  Lung bases are clear.

Hepatobiliary: Probable hepatic cirrhosis with nodular liver contour
and enlarged lateral segment left lobe. No focal lesions are
identified on noncontrast imaging. Cholelithiasis. No bile duct
dilatation.

Pancreas: Edema and stranding in the peripancreatic fat. No
loculated collections. No pancreatic ductal dilatation.

Spleen: Normal in size without focal abnormality.

Adrenals/Urinary Tract: Adrenal glands are unremarkable. Kidneys are
normal, without renal calculi, focal lesion, or hydronephrosis.
Bladder is unremarkable.

Stomach/Bowel: Stomach, small bowel, and colon are not abnormally
distended. No wall thickening or inflammatory changes appreciated.
Appendix is not identified.

Vascular/Lymphatic: Aortic atherosclerosis. No enlarged abdominal or
pelvic lymph nodes.

Reproductive: Prostate is unremarkable.

Other: No free air or free fluid in the abdomen. Abdominal wall
musculature appears intact.

Musculoskeletal: Normal alignment of the lumbar spine. Degenerative
changes most prominent in the lower thoracic spine. No destructive
bone lesions.
IMPRESSION: 1. Edema and stranding in the peripancreatic fat consistent with
acute pancreatitis. No loculated collections.
2. Probable hepatic cirrhosis.
3. Cholelithiasis without evidence of cholecystitis.
4. Aortic atherosclerosis.

Aortic Atherosclerosis ([id number]-N76.6).

## 2022-11-18 ENCOUNTER — Encounter (HOSPITAL_COMMUNITY): Payer: Self-pay

## 2022-11-18 ENCOUNTER — Observation Stay (HOSPITAL_COMMUNITY): Payer: Federal, State, Local not specified - PPO

## 2022-11-18 ENCOUNTER — Emergency Department (HOSPITAL_COMMUNITY): Payer: Federal, State, Local not specified - PPO

## 2022-11-18 ENCOUNTER — Inpatient Hospital Stay (HOSPITAL_COMMUNITY)
Admission: EM | Admit: 2022-11-18 | Discharge: 2022-11-23 | DRG: 441 | Disposition: A | Discharge status: Skilled Nursing Facility | Payer: Federal, State, Local not specified - PPO | Attending: Internal Medicine | Admitting: Internal Medicine

## 2022-11-18 ENCOUNTER — Other Ambulatory Visit: Payer: Self-pay

## 2022-11-18 DIAGNOSIS — N1831 Chronic kidney disease, stage 3a: Secondary | ICD-10-CM | POA: Diagnosis present

## 2022-11-18 DIAGNOSIS — Z6835 Body mass index (BMI) 35.0-35.9, adult: Secondary | ICD-10-CM

## 2022-11-18 DIAGNOSIS — I8510 Secondary esophageal varices without bleeding: Secondary | ICD-10-CM | POA: Diagnosis not present

## 2022-11-18 DIAGNOSIS — Z951 Presence of aortocoronary bypass graft: Secondary | ICD-10-CM | POA: Diagnosis not present

## 2022-11-18 DIAGNOSIS — K7581 Nonalcoholic steatohepatitis (NASH): Principal | ICD-10-CM | POA: Diagnosis present

## 2022-11-18 DIAGNOSIS — E871 Hypo-osmolality and hyponatremia: Secondary | ICD-10-CM | POA: Diagnosis not present

## 2022-11-18 DIAGNOSIS — Z807 Family history of other malignant neoplasms of lymphoid, hematopoietic and related tissues: Secondary | ICD-10-CM

## 2022-11-18 DIAGNOSIS — R531 Weakness: Principal | ICD-10-CM

## 2022-11-18 DIAGNOSIS — R600 Localized edema: Secondary | ICD-10-CM

## 2022-11-18 DIAGNOSIS — R188 Other ascites: Secondary | ICD-10-CM | POA: Diagnosis not present

## 2022-11-18 DIAGNOSIS — D693 Immune thrombocytopenic purpura: Secondary | ICD-10-CM | POA: Diagnosis present

## 2022-11-18 DIAGNOSIS — N179 Acute kidney failure, unspecified: Secondary | ICD-10-CM | POA: Diagnosis not present

## 2022-11-18 DIAGNOSIS — E1169 Type 2 diabetes mellitus with other specified complication: Secondary | ICD-10-CM | POA: Diagnosis not present

## 2022-11-18 DIAGNOSIS — J9811 Atelectasis: Secondary | ICD-10-CM | POA: Diagnosis present

## 2022-11-18 DIAGNOSIS — E1122 Type 2 diabetes mellitus with diabetic chronic kidney disease: Secondary | ICD-10-CM | POA: Diagnosis present

## 2022-11-18 DIAGNOSIS — N183 Chronic kidney disease, stage 3 unspecified: Secondary | ICD-10-CM | POA: Diagnosis present

## 2022-11-18 DIAGNOSIS — Z8249 Family history of ischemic heart disease and other diseases of the circulatory system: Secondary | ICD-10-CM

## 2022-11-18 DIAGNOSIS — Z833 Family history of diabetes mellitus: Secondary | ICD-10-CM

## 2022-11-18 DIAGNOSIS — Z6838 Body mass index (BMI) 38.0-38.9, adult: Secondary | ICD-10-CM | POA: Diagnosis not present

## 2022-11-18 DIAGNOSIS — E669 Obesity, unspecified: Secondary | ICD-10-CM | POA: Diagnosis present

## 2022-11-18 DIAGNOSIS — K7460 Unspecified cirrhosis of liver: Secondary | ICD-10-CM | POA: Diagnosis present

## 2022-11-18 DIAGNOSIS — L89152 Pressure ulcer of sacral region, stage 2: Secondary | ICD-10-CM | POA: Diagnosis present

## 2022-11-18 DIAGNOSIS — I868 Varicose veins of other specified sites: Secondary | ICD-10-CM | POA: Diagnosis present

## 2022-11-18 DIAGNOSIS — E11649 Type 2 diabetes mellitus with hypoglycemia without coma: Secondary | ICD-10-CM | POA: Diagnosis present

## 2022-11-18 DIAGNOSIS — I2510 Atherosclerotic heart disease of native coronary artery without angina pectoris: Secondary | ICD-10-CM | POA: Diagnosis present

## 2022-11-18 DIAGNOSIS — K7469 Other cirrhosis of liver: Secondary | ICD-10-CM | POA: Diagnosis not present

## 2022-11-18 DIAGNOSIS — Z800 Family history of malignant neoplasm of digestive organs: Secondary | ICD-10-CM

## 2022-11-18 DIAGNOSIS — K922 Gastrointestinal hemorrhage, unspecified: Secondary | ICD-10-CM | POA: Diagnosis not present

## 2022-11-18 DIAGNOSIS — E1165 Type 2 diabetes mellitus with hyperglycemia: Secondary | ICD-10-CM | POA: Diagnosis present

## 2022-11-18 DIAGNOSIS — I5033 Acute on chronic diastolic (congestive) heart failure: Secondary | ICD-10-CM | POA: Diagnosis present

## 2022-11-18 DIAGNOSIS — Z96652 Presence of left artificial knee joint: Secondary | ICD-10-CM | POA: Diagnosis present

## 2022-11-18 DIAGNOSIS — E875 Hyperkalemia: Secondary | ICD-10-CM | POA: Diagnosis present

## 2022-11-18 DIAGNOSIS — I9589 Other hypotension: Secondary | ICD-10-CM | POA: Diagnosis present

## 2022-11-18 DIAGNOSIS — E2740 Unspecified adrenocortical insufficiency: Secondary | ICD-10-CM | POA: Diagnosis present

## 2022-11-18 DIAGNOSIS — L8990 Pressure ulcer of unspecified site, unspecified stage: Secondary | ICD-10-CM | POA: Insufficient documentation

## 2022-11-18 DIAGNOSIS — K766 Portal hypertension: Secondary | ICD-10-CM | POA: Diagnosis not present

## 2022-11-18 DIAGNOSIS — E785 Hyperlipidemia, unspecified: Secondary | ICD-10-CM | POA: Diagnosis present

## 2022-11-18 DIAGNOSIS — I8511 Secondary esophageal varices with bleeding: Secondary | ICD-10-CM | POA: Diagnosis present

## 2022-11-18 DIAGNOSIS — R000 Tachycardia, unspecified: Secondary | ICD-10-CM | POA: Diagnosis not present

## 2022-11-18 DIAGNOSIS — I130 Hypertensive heart and chronic kidney disease with heart failure and stage 1 through stage 4 chronic kidney disease, or unspecified chronic kidney disease: Secondary | ICD-10-CM | POA: Diagnosis not present

## 2022-11-18 DIAGNOSIS — K219 Gastro-esophageal reflux disease without esophagitis: Secondary | ICD-10-CM | POA: Diagnosis not present

## 2022-11-18 DIAGNOSIS — Z751 Person awaiting admission to adequate facility elsewhere: Secondary | ICD-10-CM

## 2022-11-18 DIAGNOSIS — D62 Acute posthemorrhagic anemia: Secondary | ICD-10-CM | POA: Diagnosis not present

## 2022-11-18 DIAGNOSIS — J95821 Acute postprocedural respiratory failure: Secondary | ICD-10-CM | POA: Diagnosis not present

## 2022-11-18 DIAGNOSIS — M6281 Muscle weakness (generalized): Secondary | ICD-10-CM | POA: Diagnosis present

## 2022-11-18 DIAGNOSIS — K7290 Hepatic failure, unspecified without coma: Secondary | ICD-10-CM | POA: Diagnosis not present

## 2022-11-18 DIAGNOSIS — Z9989 Dependence on other enabling machines and devices: Secondary | ICD-10-CM

## 2022-11-18 DIAGNOSIS — E876 Hypokalemia: Secondary | ICD-10-CM | POA: Diagnosis present

## 2022-11-18 DIAGNOSIS — W19XXXA Unspecified fall, initial encounter: Secondary | ICD-10-CM | POA: Diagnosis not present

## 2022-11-18 DIAGNOSIS — K8020 Calculus of gallbladder without cholecystitis without obstruction: Secondary | ICD-10-CM | POA: Diagnosis present

## 2022-11-18 DIAGNOSIS — G4733 Obstructive sleep apnea (adult) (pediatric): Secondary | ICD-10-CM | POA: Diagnosis present

## 2022-11-18 DIAGNOSIS — D508 Other iron deficiency anemias: Secondary | ICD-10-CM | POA: Diagnosis not present

## 2022-11-18 DIAGNOSIS — Z888 Allergy status to other drugs, medicaments and biological substances status: Secondary | ICD-10-CM

## 2022-11-18 DIAGNOSIS — R2681 Unsteadiness on feet: Secondary | ICD-10-CM | POA: Diagnosis present

## 2022-11-18 DIAGNOSIS — Z79899 Other long term (current) drug therapy: Secondary | ICD-10-CM

## 2022-11-18 DIAGNOSIS — Z86010 Personal history of colonic polyps: Secondary | ICD-10-CM

## 2022-11-18 DIAGNOSIS — Z7982 Long term (current) use of aspirin: Secondary | ICD-10-CM

## 2022-11-18 DIAGNOSIS — Z794 Long term (current) use of insulin: Secondary | ICD-10-CM

## 2022-11-18 DIAGNOSIS — D631 Anemia in chronic kidney disease: Secondary | ICD-10-CM | POA: Diagnosis not present

## 2022-11-18 LAB — CBC
HCT: 28.7 % — ABNORMAL LOW (ref 39.0–52.0)
HCT: 29.5 % — ABNORMAL LOW (ref 39.0–52.0)
Hemoglobin: 9.9 g/dL — ABNORMAL LOW (ref 13.0–17.0)
Hemoglobin: 9.8 g/dL — ABNORMAL LOW (ref 13.0–17.0)
MCH: 32.5 pg (ref 26.0–34.0)
MCH: 31.5 pg (ref 26.0–34.0)
MCHC: 34.5 g/dL (ref 30.0–36.0)
MCHC: 33.2 g/dL (ref 30.0–36.0)
MCV: 94.1 fL (ref 80.0–100.0)
MCV: 94.9 fL (ref 80.0–100.0)
Platelets: 166 10*3/uL (ref 150–400)
Platelets: 141 10*3/uL — ABNORMAL LOW (ref 150–400)
RBC: 3.05 MIL/uL — ABNORMAL LOW (ref 4.22–5.81)
RBC: 3.11 MIL/uL — ABNORMAL LOW (ref 4.22–5.81)
RDW: 17 % — ABNORMAL HIGH (ref 11.5–15.5)
RDW: 17.2 % — ABNORMAL HIGH (ref 11.5–15.5)
WBC: 6.4 10*3/uL (ref 4.0–10.5)
WBC: 5.7 10*3/uL (ref 4.0–10.5)
nRBC: 0 % (ref 0.0–0.2)
nRBC: 0 % (ref 0.0–0.2)

## 2022-11-18 LAB — PHOSPHORUS: Phosphorus: 2.9 mg/dL (ref 2.5–4.6)

## 2022-11-18 LAB — URINALYSIS, ROUTINE W REFLEX MICROSCOPIC
Bacteria, UA: NONE SEEN
Bilirubin Urine: NEGATIVE
Glucose, UA: NEGATIVE mg/dL
Ketones, ur: NEGATIVE mg/dL
Leukocytes,Ua: NEGATIVE
Nitrite: NEGATIVE
Protein, ur: NEGATIVE mg/dL
Specific Gravity, Urine: 1.01 (ref 1.005–1.030)
pH: 6 (ref 5.0–8.0)

## 2022-11-18 LAB — BASIC METABOLIC PANEL
Anion gap: 8 (ref 5–15)
BUN: 30 mg/dL — ABNORMAL HIGH (ref 8–23)
CO2: 28 mmol/L (ref 22–32)
Calcium: 8.4 mg/dL — ABNORMAL LOW (ref 8.9–10.3)
Chloride: 94 mmol/L — ABNORMAL LOW (ref 98–111)
Creatinine, Ser: 1.45 mg/dL — ABNORMAL HIGH (ref 0.61–1.24)
GFR, Estimated: 54 mL/min — ABNORMAL LOW (ref >60)
Glucose, Bld: 60 mg/dL — ABNORMAL LOW (ref 70–99)
Potassium: 3.3 mmol/L — ABNORMAL LOW (ref 3.5–5.1)
Sodium: 130 mmol/L — ABNORMAL LOW (ref 135–145)

## 2022-11-18 LAB — TSH: TSH: 1.827 u[IU]/mL (ref 0.350–4.500)

## 2022-11-18 LAB — BODY FLUID CELL COUNT WITH DIFFERENTIAL
Lymphs, Fluid: 63 %
Monocyte-Macrophage-Serous Fluid: 34 % — ABNORMAL LOW (ref 50–90)
Neutrophil Count, Fluid: 3 % (ref 0–25)
Total Nucleated Cell Count, Fluid: 326 cu mm (ref 0–1000)

## 2022-11-18 LAB — CBG MONITORING, ED
Glucose-Capillary: 107 mg/dL — ABNORMAL HIGH (ref 70–99)
Glucose-Capillary: 187 mg/dL — ABNORMAL HIGH (ref 70–99)
Glucose-Capillary: 218 mg/dL — ABNORMAL HIGH (ref 70–99)

## 2022-11-18 LAB — BRAIN NATRIURETIC PEPTIDE: B Natriuretic Peptide: 211 pg/mL — ABNORMAL HIGH (ref 0.0–100.0)

## 2022-11-18 LAB — GRAM STAIN

## 2022-11-18 LAB — GLUCOSE, CAPILLARY: Glucose-Capillary: 245 mg/dL — ABNORMAL HIGH (ref 70–99)

## 2022-11-18 LAB — PROTEIN, PLEURAL OR PERITONEAL FLUID: Total protein, fluid: <3 g/dL

## 2022-11-18 LAB — CREATININE, SERUM
Creatinine, Ser: 1.22 mg/dL (ref 0.61–1.24)
GFR, Estimated: >60 mL/min (ref >60)

## 2022-11-18 LAB — MAGNESIUM: Magnesium: 2.1 mg/dL (ref 1.7–2.4)

## 2022-11-18 LAB — LACTATE DEHYDROGENASE, PLEURAL OR PERITONEAL FLUID: LD, Fluid: 93 U/L — ABNORMAL HIGH (ref 3–23)

## 2022-11-18 LAB — AMMONIA: Ammonia: 34 umol/L (ref 9–35)

## 2022-11-18 MED ORDER — SODIUM CHLORIDE 0.9% FLUSH: 3.0000 mL | INTRAVENOUS | Status: DC | PRN

## 2022-11-18 MED ORDER — LORATADINE 10 MG PO TABS
10.0000 mg | ORAL_TABLET | Freq: Every morning | ORAL | Status: DC
  Administered 2022-11-18 – 2022-11-23 (×6): 10 mg via ORAL
  Filled 2022-11-18 (×7): qty 1

## 2022-11-18 MED ORDER — INSULIN ASPART 100 UNIT/ML IJ SOLN
0.0000 [IU] | Freq: Three times a day (TID) | INTRAMUSCULAR | Status: DC
  Administered 2022-11-18: 5 [IU] via SUBCUTANEOUS
  Administered 2022-11-18: 3 [IU] via SUBCUTANEOUS
  Administered 2022-11-19 (×2): 11 [IU] via SUBCUTANEOUS
  Administered 2022-11-19: 3 [IU] via SUBCUTANEOUS
  Administered 2022-11-20: 5 [IU] via SUBCUTANEOUS
  Administered 2022-11-20: 8 [IU] via SUBCUTANEOUS
  Administered 2022-11-20: 5 [IU] via SUBCUTANEOUS
  Administered 2022-11-21: 3 [IU] via SUBCUTANEOUS
  Administered 2022-11-21: 8 [IU] via SUBCUTANEOUS
  Administered 2022-11-21: 11 [IU] via SUBCUTANEOUS
  Administered 2022-11-22: 8 [IU] via SUBCUTANEOUS
  Administered 2022-11-22: 2 [IU] via SUBCUTANEOUS
  Administered 2022-11-22 – 2022-11-23 (×2): 3 [IU] via SUBCUTANEOUS
  Administered 2022-11-23: 11 [IU] via SUBCUTANEOUS
  Filled 2022-11-18 (×2): qty 1

## 2022-11-18 MED ORDER — HEPARIN SODIUM (PORCINE) 5000 UNIT/ML IJ SOLN
5000.0000 [IU] | Freq: Three times a day (TID) | INTRAMUSCULAR | Status: DC
  Administered 2022-11-18 – 2022-11-23 (×15): 5000 [IU] via SUBCUTANEOUS
  Filled 2022-11-18 (×15): qty 1

## 2022-11-18 MED ORDER — POTASSIUM CHLORIDE CRYS ER 20 MEQ PO TBCR
40.0000 meq | EXTENDED_RELEASE_TABLET | Freq: Every day | ORAL | Status: DC
  Administered 2022-11-18 – 2022-11-23 (×6): 40 meq via ORAL
  Filled 2022-11-18 (×6): qty 2

## 2022-11-18 MED ORDER — ONDANSETRON HCL 4 MG PO TABS: 4.0000 mg | ORAL_TABLET | Freq: Four times a day (QID) | ORAL | Status: DC | PRN

## 2022-11-18 MED ORDER — INSULIN ASPART 100 UNIT/ML IJ SOLN
0.0000 [IU] | Freq: Every day | INTRAMUSCULAR | Status: DC
  Administered 2022-11-18: 2 [IU] via SUBCUTANEOUS
  Administered 2022-11-19: 5 [IU] via SUBCUTANEOUS

## 2022-11-18 MED ORDER — MIDODRINE HCL 5 MG PO TABS
10.0000 mg | ORAL_TABLET | Freq: Three times a day (TID) | ORAL | Status: DC
  Administered 2022-11-18 – 2022-11-23 (×16): 10 mg via ORAL
  Filled 2022-11-18 (×16): qty 2

## 2022-11-18 MED ORDER — ACETAMINOPHEN 325 MG PO TABS
650.0000 mg | ORAL_TABLET | Freq: Four times a day (QID) | ORAL | Status: DC | PRN
  Administered 2022-11-18 – 2022-11-22 (×6): 650 mg via ORAL
  Filled 2022-11-18 (×6): qty 2

## 2022-11-18 MED ORDER — ALBUMIN HUMAN 25 % IV SOLN
50.0000 g | Freq: Four times a day (QID) | INTRAVENOUS | Status: AC
  Administered 2022-11-18: 50 g via INTRAVENOUS
  Filled 2022-11-18 (×3): qty 200

## 2022-11-18 MED ORDER — ALPRAZOLAM 0.25 MG PO TABS
0.2500 mg | ORAL_TABLET | Freq: Two times a day (BID) | ORAL | Status: DC | PRN
  Administered 2022-11-20 – 2022-11-22 (×2): 0.25 mg via ORAL
  Filled 2022-11-18 (×2): qty 1

## 2022-11-18 MED ORDER — SODIUM CHLORIDE 0.9 % IV SOLN: 250.0000 mL | INTRAVENOUS | Status: DC | PRN

## 2022-11-18 MED ORDER — PRAVASTATIN SODIUM 10 MG PO TABS
20.0000 mg | ORAL_TABLET | Freq: Every day | ORAL | Status: DC
  Administered 2022-11-18 – 2022-11-22 (×5): 20 mg via ORAL
  Filled 2022-11-18 (×5): qty 2

## 2022-11-18 MED ORDER — LACTULOSE 10 GM/15ML PO SOLN
20.0000 g | Freq: Every day | ORAL | Status: DC
  Administered 2022-11-18 – 2022-11-23 (×6): 20 g via ORAL
  Filled 2022-11-18 (×6): qty 30

## 2022-11-18 MED ORDER — ASPIRIN 81 MG PO TBEC
81.0000 mg | DELAYED_RELEASE_TABLET | Freq: Every evening | ORAL | Status: DC
  Administered 2022-11-18 – 2022-11-22 (×5): 81 mg via ORAL
  Filled 2022-11-18 (×5): qty 1

## 2022-11-18 MED ORDER — SODIUM CHLORIDE 0.9% FLUSH
3.0000 mL | Freq: Two times a day (BID) | INTRAVENOUS | Status: DC
  Administered 2022-11-18 – 2022-11-23 (×10): 3 mL via INTRAVENOUS

## 2022-11-18 MED ORDER — ONDANSETRON HCL 4 MG/2ML IJ SOLN: 4.0000 mg | Freq: Four times a day (QID) | INTRAMUSCULAR | Status: DC | PRN

## 2022-11-18 MED ORDER — FUROSEMIDE 10 MG/ML IJ SOLN
40.0000 mg | Freq: Once | INTRAMUSCULAR | Status: AC
  Administered 2022-11-18: 40 mg via INTRAVENOUS
  Filled 2022-11-18: qty 4

## 2022-11-18 MED ORDER — VENLAFAXINE HCL ER 75 MG PO CP24
75.0000 mg | ORAL_CAPSULE | Freq: Every day | ORAL | Status: DC
  Administered 2022-11-18 – 2022-11-23 (×6): 75 mg via ORAL
  Filled 2022-11-18 (×3): qty 1
  Filled 2022-11-18: qty 2
  Filled 2022-11-18 (×2): qty 1

## 2022-11-18 MED ORDER — ALBUMIN HUMAN 25 % IV SOLN
50.0000 g | Freq: Four times a day (QID) | INTRAVENOUS | Status: DC
  Administered 2022-11-18: 50 g via INTRAVENOUS
  Filled 2022-11-18 (×3): qty 200

## 2022-11-18 MED ORDER — NADOLOL 20 MG PO TABS
20.0000 mg | ORAL_TABLET | Freq: Every day | ORAL | Status: DC
  Administered 2022-11-18 – 2022-11-23 (×6): 20 mg via ORAL
  Filled 2022-11-18 (×7): qty 1

## 2022-11-18 MED ORDER — FUROSEMIDE 10 MG/ML IJ SOLN
40.0000 mg | Freq: Two times a day (BID) | INTRAMUSCULAR | Status: DC
  Administered 2022-11-18 – 2022-11-23 (×11): 40 mg via INTRAVENOUS
  Filled 2022-11-18 (×11): qty 4

## 2022-11-18 MED ORDER — DEXTROSE 50 % IV SOLN
25.0000 mL | Freq: Once | INTRAVENOUS | Status: AC
  Administered 2022-11-18: 25 mL via INTRAVENOUS
  Filled 2022-11-18: qty 50

## 2022-11-18 MED ORDER — PANTOPRAZOLE SODIUM 40 MG PO TBEC
40.0000 mg | DELAYED_RELEASE_TABLET | Freq: Every day | ORAL | Status: DC
  Administered 2022-11-18 – 2022-11-23 (×6): 40 mg via ORAL
  Filled 2022-11-18 (×6): qty 1

## 2022-11-18 MED ORDER — SPIRONOLACTONE 25 MG PO TABS
100.0000 mg | ORAL_TABLET | Freq: Every day | ORAL | Status: DC
  Administered 2022-11-18 – 2022-11-23 (×6): 100 mg via ORAL
  Filled 2022-11-18 (×2): qty 4
  Filled 2022-11-18: qty 1
  Filled 2022-11-18 (×3): qty 4

## 2022-11-18 NOTE — ED Notes (Signed)
Pt unable to give UA at this time

## 2022-11-18 NOTE — Procedures (Signed)
    US guided RLQ paracentesis  4 Liters dark yellow fluid obtained Sent for labs per MD  Tolerated well  EBL: None

## 2022-11-18 NOTE — H&P (Signed)
History and Physical    Patient: [name] GUR:[id number] DOB: December 29, 1960 DOA: 11/18/2022 DOS: the patient was seen and examined on 11/18/2022 PCP: [name], MD  Patient coming from: Home  Chief Complaint:  Chief Complaint  Patient presents with   Weakness   HPI: YUREM VINER is a 61 y.o. male with medical history significant of [name] cirrhosis, chronic kidney disease stage III 3 a, obstructive sleep apnea on CPAP, type 2 diabetes with nephropathy, anemia of chronic kidney disease, diastolic heart failure and class II obesity; who presented to the hospital secondary to generalized weakness, increased weight gain of more than 30 pounds, shortness of breath with activity and worsening ascites. Patient denies fever, chest pain, abdominal pain, nausea or vomiting, dysuria, hematuria, melena, hematochezia or focal neurologic deficit.  Expressed associated generalized weakness and overall decreased appetite due to early satiety.  Workup in the ED demonstrating elevated BNP, chest x-ray with vascular congestion and low lung volumes with atelectasis at the bases.  Significant ascites on physical exam.  TRH has been consulted to assist with hospital placement, diuresis management and paracentesis.   Review of Systems: As mentioned in the history of present illness. All other systems reviewed and are negative.  Past Medical History:  Diagnosis Date   Anemia    Arthritis    CAD (coronary artery disease)    Multivessel disease status post CABG 08/2015   Cirrhosis ([place])    CKD (chronic kidney disease) stage 3, GFR 30-59 ml/min (HCC)    Diastolic CHF (HCC)    Essential hypertension    Hyperlipidemia    Iron deficiency anemia 07/15/2021   OSA on CPAP    Polyclonal gammopathy    Thrombocytopenia (HCC) 2016   Type 2 diabetes mellitus ([place])    Past Surgical History:  Procedure Laterality Date   APPENDECTOMY     Biceps tendon surgery Right    BIOPSY  11/22/2019   Procedure:  BIOPSY;  Surgeon: Daneil Dolin, MD;  [facility];  Service: Endoscopy;;   CARDIAC CATHETERIZATION N/A 08/25/2015   Procedure: Left Heart Cath and Coronary Angiography;  Surgeon: Belva Crome, MD;  Location: [facility];  Service: Cardiovascular;  Laterality: N/A;   CATARACT EXTRACTION Left 08/18/2022   COLONOSCOPY WITH PROPOFOL N/A 11/22/2019   Procedure: COLONOSCOPY WITH PROPOFOL;  Surgeon: Daneil Dolin, MD; Four 4-5 mm polyps, findings suggestive of portal colopathy, congested appearing colonic mucosa diffusely, rectal varices, and adequate right colon prep.  Pathology with tubular adenomas and hyperplastic polyp.  Right colon biopsy with focal active colitis.  Recommendations to repeat colonoscopy in 3 months due to poor prep.   COLONOSCOPY WITH PROPOFOL N/A 03/17/2020   Procedure: COLONOSCOPY WITH PROPOFOL;  Surgeon: Daneil Dolin, MD;  [facility];  Service: Endoscopy;  Laterality: N/A;  8:45am - pt does not need covid test, was + 2/4 <90 days   CORONARY ARTERY BYPASS GRAFT N/A 08/29/2015   Procedure: CORONARY ARTERY BYPASS GRAFTING (CABG);  Surgeon: [name], MD;  Location: [place];  Service: Open Heart Surgery;  Laterality: N/A;   ESOPHAGEAL BANDING N/A 07/23/2021   Procedure: ESOPHAGEAL BANDING;  Surgeon: Eloise Harman, DO;  [facility];  Service: Endoscopy;  Laterality: N/A;   ESOPHAGOGASTRODUODENOSCOPY (EGD) WITH PROPOFOL N/A 11/22/2019   Procedure: ESOPHAGOGASTRODUODENOSCOPY (EGD) WITH PROPOFOL;  Surgeon: Daneil Dolin, MD; 4 columns of grade 2-3 esophageal varices, portal gastropathy, gastric polyp/abnormal gastric mucosa s/p biopsy.  Pathology  with hyperplastic polyp, mild chronic gastritis, negative H. pylori.   ESOPHAGOGASTRODUODENOSCOPY (EGD) WITH PROPOFOL N/A 07/23/2021   Grade 2 esophageal varices without stigmata of bleeding, completely eradicated with banding, portal gastropathy, GAVE without bleeding.   KNEE  ARTHROSCOPY Left    TEE WITHOUT CARDIOVERSION N/A 08/29/2015   Procedure: TRANSESOPHAGEAL ECHOCARDIOGRAM (TEE);  Surgeon: [name], MD;  Location: [place];  Service: Open Heart Surgery;  Laterality: N/A;   TOTAL KNEE ARTHROPLASTY Left 03/09/2017   Procedure: TOTAL KNEE ARTHROPLASTY;  Surgeon: Carole Civil, MD;  Location: [facility];  Service: Orthopedics;  Laterality: Left;   Social History:  reports that he has never smoked. He has never used smokeless tobacco. He reports that he does not currently use alcohol. He reports that he does not use drugs.  Allergies  Allergen Reactions   Fluticasone Rash    Family History  Problem Relation Age of Onset   Arthritis Other    Cancer Other    Diabetes Other    CAD Father    Diabetes Mellitus II Father    Liver cancer Father    Hodgkin's lymphoma Father    CAD Brother    Diabetes Mellitus II Brother    ALS Mother    Diabetes Mellitus II Sister    Diabetes Mellitus II Brother    Diabetes Mellitus II Maternal Grandmother    Aneurysm Maternal Grandmother    Cancer Maternal Grandfather    Anesthesia problems Neg Hx    Hypotension Neg Hx    Malignant hyperthermia Neg Hx    Pseudochol deficiency Neg Hx    Colon cancer Neg Hx     Prior to Admission medications   Medication Sig Start Date End Date Taking? Authorizing Provider  acetaminophen (TYLENOL) 325 MG tablet Take 650 mg by mouth every 6 (six) hours as needed for mild pain. 12/19/20  Yes [provider]  albuterol (VENTOLIN HFA) 108 (90 Base) MCG/ACT inhaler Inhale 2 puffs into the lungs every 6 (six) hours as needed for wheezing or shortness of breath. 12/22/20  Yes Gerlene Fee, NP  ALPRAZolam Duanne Moron) 0.25 MG tablet Take 0.25 mg by mouth daily. 02/20/21  Yes [provider]  aspirin EC 81 MG tablet Take 1 tablet (81 mg total) by mouth every evening. 10/23/22  Yes Johnson, Clanford L, MD  Cholecalciferol (DIALYVITE VITAMIN D 5000) 125 MCG (5000 UT) capsule  Take 5,000 Units by mouth in the morning.    Yes [provider]  diphenoxylate-atropine (LOMOTIL) 2.5-0.025 MG tablet Take 2 tablets by mouth 4 (four) times daily as needed for diarrhea or loose stools. 10/15/22  Yes [provider]  doxycycline (VIBRAMYCIN) 100 MG capsule Take 100 mg by mouth 2 (two) times daily. 11/11/22  Yes [provider]  insulin aspart (NOVOLOG) 100 UNIT/ML injection Inject 5 Units into the skin 3 (three) times daily before meals. Special Instructions: If accu-check is greater than 150. Hold for accu-check 150 or below. With Meals 12/22/20  Yes Gerlene Fee, NP  insulin degludec (TRESIBA) 100 UNIT/ML FlexTouch Pen Inject 52 Units into the skin daily. 11/07/22 02/05/23 Yes [provider]  lactulose (CHRONULAC) 10 GM/15ML solution Take 30 mLs (20 g total) by mouth daily. 12/22/20  Yes Gerlene Fee, NP  loratadine (CLARITIN) 10 MG tablet Take 10 mg by mouth in the morning.    Yes [provider]  midodrine (PROAMATINE) 10 MG tablet Take 1 tablet (10 mg total) by mouth 3 (three) times daily  with meals. 08/25/21  Yes Emokpae, Courage, MD  nadolol (CORGARD) 20 MG tablet Take 1 tablet (20 mg total) by mouth daily. 06/07/22  Yes Imogene Burn, PA-C  oxyCODONE (OXY IR/ROXICODONE) 5 MG immediate release tablet Take 5 mg by mouth every 4 (four) hours as needed for moderate pain. 11/11/22  Yes [provider]  potassium chloride SA (KLOR-CON M) 20 MEQ tablet TAKE 1 TABLET(20 MEQ) BY MOUTH DAILY 09/27/22  Yes Satira Sark, MD  pravastatin (PRAVACHOL) 20 MG tablet TAKE 1 TABLET(20 MG) BY MOUTH AT BEDTIME 09/08/22  Yes Strader, [place] M, PA-C  spironolactone (ALDACTONE) 100 MG tablet Take 1 tablet (100 mg total) by mouth daily. 10/23/22  Yes Johnson, Clanford L, MD  torsemide (DEMADEX) 20 MG tablet Take 3 tablets (60 mg total) by mouth 2 (two) times daily. 11/08/22 11/03/23 Yes Satira Sark, MD  venlafaxine XR  (EFFEXOR-XR) 75 MG 24 hr capsule Take 1 capsule (75 mg total) by mouth daily with breakfast. 08/25/21  Yes Emokpae, Courage, MD  Armodafinil 50 MG tablet Take 1 tablet (50 mg total) by mouth as needed. Patient not taking: Reported on 11/18/2022 06/03/22   Dohmeier, Asencion Partridge, MD  Continuous Blood Gluc Sensor (FREESTYLE LIBRE 14 DAY SENSOR) MISC SMARTSIG:1 Each Topical Every 2 Weeks 10/03/21   [provider]  nitroGLYCERIN (NITROSTAT) 0.4 MG SL tablet Place 1 tablet (0.4 mg total) under the tongue every 5 (five) minutes as needed for chest pain. Do not exceed 3 doses in 15 mins 11/04/21 09/08/22  Hilty, Nadean Corwin, MD  pantoprazole (PROTONIX) 40 MG tablet Take 1 tablet (40 mg total) by mouth daily. 08/25/21 09/08/22  Roxan Hockey, MD    Physical Exam: Vitals:   11/18/22 1630 11/18/22 1700 11/18/22 1745 11/18/22 1815  BP: 98/72 96/73    Pulse: 76 75 80 74  Resp: (!) 21 (!) 25 19 17   Temp:      TempSrc:      SpO2: 98% 97% 98% 98%  Weight:      Height:       General exam: Alert, awake, oriented x 3; no fever, no chest pain, reports no nausea or vomiting. Respiratory system: Slightly tachypneic with activity (patient reports inability to take deep breath due to ascites).  Fine crackles at the bases; no using accessory muscles. Cardiovascular system:RRR. No rubs or gallops; no JVD. Gastrointestinal system: Abdomen is distended, soft and nontender.  Positive fluid wave on exam; positive bowel sounds. Central nervous system: Alert and oriented. No focal neurological deficits. Extremities: No cyanosis or clubbing; 2+ edema appreciated bilaterally. Skin: No petechiae. Psychiatry: Judgement and insight appear normal. Mood & affect appropriate.   Data Reviewed: Basic metabolic panel: Sodium 017, potassium 3.3, chloride 94, bicarb 28, BUN 30, creatinine 1.45 and GFR 54 CBC: WBC 6.4, hemoglobin 9.9 and platelet count 166 K Brain natruretic peptide: 211 Magnesium 2.1 TSH: 1.827  Assessment  and Plan: * Ascites -will provide paracentesis and check fluid to rule out SBP -patient without fever, abdominal pain or elevated WBC's currently -albumin and lasix will be provided -continue use of aldactone -low sodium diet discussed with patient.  Obese -Body mass index is 35.49 kg/m. -class 2 obesity -low calorie diet and protion control discussed with patient.  Hyperlipidemia associated with type 2 diabetes mellitus ([place]) -continue statin  CKD stage 3 due to type 2 diabetes mellitus ([place]) -appears to be stable overall -class 3a at baseline -follow renal function trend with acute diuresis   Acute on  chronic diastolic CHF (congestive heart failure) ([place]) -patient currently 30 pounds above usual weight -follow daily weight, strict intake/output and los sodium diet -IV lasix initiated -continue treatment with aldactone and follow urine output, if needed will add enhancing  metolazone to his regimen. -follow clinical response, renal function and electrolytes during acute diuresis process. -recent echo demonstrating diastolic heart failure with preserved EF; will not repeat during this hospitalization.  GERD (gastroesophageal reflux disease) -continue PPI  NASH cirrhosis of liver (HCC) -stable LFT's and MELD currently -ascites appreciated -will continue low sodium diet -continue aldactone and diuresis with IV lasix -will provide paracentesis and give albumin -continue b-blocker, PPI and lactulose.  OSA on CPAP -expressed he has not been using CPAP lately  -will monitor and resume usage as needed.  S/P CABG x 4 -currently CP free -continue telemetry monitoring -continue aspirin, b-blocker, aldactone and statin      Advance Care Planning:   Code Status: Full Code   Consults: None  Family Communication: Wife at bedside  Severity of Illness: The appropriate patient status for this patient is INPATIENT. Inpatient status is judged to be reasonable and necessary  in order to provide the required intensity of service to ensure the patient's safety. The patient's presenting symptoms, physical exam findings, and initial radiographic and laboratory data in the context of their chronic comorbidities is felt to place them at high risk for further clinical deterioration. Furthermore, it is not anticipated that the patient will be medically stable for discharge from the hospital within 2 midnights of admission.   * I certify that at the point of admission it is my clinical judgment that the patient will require inpatient hospital care spanning beyond 2 midnights from the point of admission due to high intensity of service, high risk for further deterioration and high frequency of surveillance required.*  Author: Barton Dubois, MD 11/18/2022 6:25 PM  For on call review [email or web address].

## 2022-11-18 NOTE — ED Provider Notes (Signed)
[facility] EMERGENCY DEPARTMENT Provider Note   CSN: [id number] Arrival date & time: 11/18/22  0143     History  Chief Complaint  Patient presents with   Weakness    [name] is a 61 y.o. male.  Patient is a 61 year old male with past medical history of hypertension, type 2 diabetes, hyperlipidemia, coronary artery disease with CABG, obstructive sleep apnea, chronic renal insufficiency, and cirrhosis.  Patient presenting with complaints of weakness.  He was attempting to ambulate this evening and was unable to navigate walking around the corner and actually had to slide to the floor.  His wife apparently helped lower him to the ground and he denies sustaining any injury.  He reports that this has been occurring intermittently for the past 2 weeks.  He denies to me he is experiencing any fevers or chills.  He reports generalized joint and muscle pain and also feels as though he is swollen and retaining fluid.  This is happened to him in the past.  The history is provided by the patient.       Home Medications Prior to Admission medications   Medication Sig Start Date End Date Taking? Authorizing Provider  acetaminophen (TYLENOL) 325 MG tablet Take 650 mg by mouth every 6 (six) hours as needed for mild pain. 12/19/20   [provider]  albuterol (VENTOLIN HFA) 108 (90 Base) MCG/ACT inhaler Inhale 2 puffs into the lungs every 6 (six) hours as needed for wheezing or shortness of breath. 12/22/20   Gerlene Fee, NP  ALPRAZolam Duanne Moron) 0.25 MG tablet Take 0.25 mg by mouth daily. 02/20/21   [provider]  Armodafinil 50 MG tablet Take 1 tablet (50 mg total) by mouth as needed. 06/03/22   Dohmeier, Asencion Partridge, MD  aspirin EC 81 MG tablet Take 1 tablet (81 mg total) by mouth every evening. 10/23/22   Johnson, Clanford L, MD  Cholecalciferol (DIALYVITE VITAMIN D 5000) 125 MCG (5000 UT) capsule Take 5,000 Units by mouth in the morning.     [provider]   Continuous Blood Gluc Sensor (FREESTYLE LIBRE 14 DAY SENSOR) MISC SMARTSIG:1 Each Topical Every 2 Weeks 10/03/21   [provider]  diphenoxylate-atropine (LOMOTIL) 2.5-0.025 MG tablet Take 2 tablets by mouth 4 (four) times daily as needed for diarrhea or loose stools. 10/15/22   [provider]  insulin aspart (NOVOLOG) 100 UNIT/ML injection Inject 5 Units into the skin 3 (three) times daily before meals. Special Instructions: If accu-check is greater than 150. Hold for accu-check 150 or below. With Meals 12/22/20   Gerlene Fee, NP  insulin degludec (TRESIBA FLEXTOUCH) 200 UNIT/ML FlexTouch Pen Inject 30 Units into the skin See admin instructions. Give 30 units Subcutaneous  every evening 10/23/22   Johnson, Clanford L, MD  lactulose (CHRONULAC) 10 GM/15ML solution Take 30 mLs (20 g total) by mouth daily. 12/22/20   Gerlene Fee, NP  loratadine (CLARITIN) 10 MG tablet Take 10 mg by mouth in the morning.     [provider]  midodrine (PROAMATINE) 10 MG tablet Take 1 tablet (10 mg total) by mouth 3 (three) times daily with meals. 08/25/21   Roxan Hockey, MD  nadolol (CORGARD) 20 MG tablet Take 1 tablet (20 mg total) by mouth daily. 06/07/22   Imogene Burn, PA-C  nitroGLYCERIN (NITROSTAT) 0.4 MG SL tablet Place 1 tablet (0.4 mg total) under the tongue every 5 (five) minutes as needed for chest pain. Do not exceed 3 doses  in 15 mins 11/04/21 09/08/22  Pixie Casino, MD  pantoprazole (PROTONIX) 40 MG tablet Take 1 tablet (40 mg total) by mouth daily. 08/25/21 09/08/22  Roxan Hockey, MD  potassium chloride SA (KLOR-CON M) 20 MEQ tablet TAKE 1 TABLET(20 MEQ) BY MOUTH DAILY 09/27/22   Satira Sark, MD  pravastatin (PRAVACHOL) 20 MG tablet TAKE 1 TABLET(20 MG) BY MOUTH AT BEDTIME 09/08/22   Strader, [place] M, PA-C  spironolactone (ALDACTONE) 100 MG tablet Take 1 tablet (100 mg total) by mouth daily. 10/23/22   Johnson, Clanford L, MD  torsemide (DEMADEX) 20  MG tablet Take 3 tablets (60 mg total) by mouth 2 (two) times daily. 11/08/22 11/03/23  Satira Sark, MD  venlafaxine XR (EFFEXOR-XR) 75 MG 24 hr capsule Take 1 capsule (75 mg total) by mouth daily with breakfast. 08/25/21   Roxan Hockey, MD      Allergies    Fluticasone    Review of Systems   Review of Systems  All other systems reviewed and are negative.   Physical Exam Updated Vital Signs BP 92/68 (BP Location: Left Arm)   Pulse 88   Temp 98.2 F (36.8 C) (Oral)   Resp 16   Ht 5' 10"  (1.778 m)   Wt 116.1 kg   SpO2 98%   BMI 36.73 kg/m  Physical Exam Vitals and nursing note reviewed.  Constitutional:      General: He is not in acute distress.    Appearance: He is well-developed. He is not diaphoretic.  HENT:     Head: Normocephalic and atraumatic.  Cardiovascular:     Rate and Rhythm: Normal rate and regular rhythm.     Heart sounds: No murmur heard.    No friction rub.  Pulmonary:     Effort: Pulmonary effort is normal. No respiratory distress.     Breath sounds: Normal breath sounds. No wheezing or rales.  Abdominal:     General: Bowel sounds are normal. There is no distension.     Palpations: Abdomen is soft.     Tenderness: There is no abdominal tenderness.  Musculoskeletal:        General: Normal range of motion.     Cervical back: Normal range of motion and neck supple.     Right lower leg: Edema present.     Left lower leg: Edema present.     Comments: There is 1-2+ pitting edema both lower extremities.  Skin:    General: Skin is warm and dry.  Neurological:     Mental Status: He is alert and oriented to person, place, and time.     Coordination: Coordination normal.     ED Results / Procedures / Treatments   Labs (all labs ordered are listed, but only abnormal results are displayed) Labs Reviewed  BASIC METABOLIC PANEL - Abnormal; Notable for the following components:      Result Value   Sodium 130 (*)    Potassium 3.3 (*)    Chloride  94 (*)    Glucose, Bld 60 (*)    BUN 30 (*)    Creatinine, Ser 1.45 (*)    Calcium 8.4 (*)    GFR, Estimated 54 (*)    All other components within normal limits  CBC - Abnormal; Notable for the following components:   RBC 3.05 (*)    Hemoglobin 9.9 (*)    HCT 28.7 (*)    RDW 17.0 (*)    All other components within normal limits  URINALYSIS, ROUTINE W REFLEX MICROSCOPIC  BRAIN NATRIURETIC PEPTIDE  CBG MONITORING, ED    EKG EKG Interpretation  Date/Time:  Thursday November 18 2022 02:06:18 EST Ventricular Rate:  86 PR Interval:  159 QRS Duration: 97 QT Interval:  377 QTC Calculation: 451 R Axis:   31 Text Interpretation: Sinus rhythm Inferior infarct, age indeterminate Baseline wander in lead(s) V2 No significant change since 10/21/2022 Confirmed by Veryl Speak [phone]) on 11/18/2022 3:27:03 AM  Radiology DG Chest Port 1 View  Result Date: 11/18/2022 CLINICAL DATA:  Weakness, history of fall 2 weeks ago. EXAM: PORTABLE CHEST 1 VIEW COMPARISON:  10/28/2022. FINDINGS: The heart size and mediastinal contours are stable. There is atherosclerotic calcification of the aorta. Lung volumes are low with mild atelectasis at the lung bases. No definite effusion or pneumothorax. Sternotomy wires are noted. No acute osseous abnormality. IMPRESSION: Low lung volumes with atelectasis at the lung bases. Electronically Signed   By: Brett Fairy M.D.   On: 11/18/2022 03:45    Procedures Procedures    Medications Ordered in ED Medications  furosemide (LASIX) injection 40 mg (40 mg Intravenous Given 11/18/22 0346)    ED Course/ Medical Decision Making/ A&P  Patient is a 61 year old male with extensive past medical history as described in the HPI.  Patient presenting with complaints of weakness, frequent falls,, increased leg edema, and abdominal distention.  He has history of cirrhosis and has had a paracentesis in the past approximately 1 month ago.  Patient arrives here with stable vital  signs and is afebrile.  He does have significant abdominal distention and 2+ pitting edema of both lower extremities.  Physical examination otherwise unremarkable.  Workup initiated including CBC, CMP, and BNP.  BNP mildly elevated at 211, but laboratory studies otherwise unremarkable.  Chest x-ray shows no acute process.  Patient reports a 30 pound weight gain over the past 2 weeks and I suspect that this is the cause of his weakness and instability with gait.  I feel as though he will need diuresis and was given IV Lasix here in the ER.  I also feel as though he will likely require a therapeutic paracentesis.  I have spoken with the hospitalist who agrees to admit.  Final Clinical Impression(s) / ED Diagnoses Final diagnoses:  None    Rx / DC Orders ED Discharge Orders     None         Veryl Speak, MD 11/18/22 [phone]

## 2022-11-18 NOTE — ED Notes (Signed)
Gave pt graham crackers and diet soda

## 2022-11-18 NOTE — ED Triage Notes (Signed)
Pt to ED via [facility] from home. EMS states pt was unable to turn a corner with a walker and wife helped him slide down the wall. Pt states he fell 2 weeks ago and has been getting weaker since it occurred. Pt state he hurts all over.   EMS VS: 110/70 HR=80 Fsbs=113

## 2022-11-18 NOTE — Progress Notes (Signed)
Paracentesis complete no signs of distress. 4L ascites removed.

## 2022-11-18 NOTE — ED Notes (Signed)
Pt given lunch tray.

## 2022-11-18 NOTE — ED Notes (Signed)
Pt given breakfast tray

## 2022-11-18 NOTE — ED Notes (Signed)
Patient transported to Ultrasound 

## 2022-11-19 DIAGNOSIS — J9811 Atelectasis: Secondary | ICD-10-CM | POA: Diagnosis present

## 2022-11-19 DIAGNOSIS — D631 Anemia in chronic kidney disease: Secondary | ICD-10-CM | POA: Diagnosis present

## 2022-11-19 DIAGNOSIS — E669 Obesity, unspecified: Secondary | ICD-10-CM | POA: Diagnosis present

## 2022-11-19 DIAGNOSIS — E871 Hypo-osmolality and hyponatremia: Secondary | ICD-10-CM | POA: Diagnosis present

## 2022-11-19 DIAGNOSIS — N1831 Chronic kidney disease, stage 3a: Secondary | ICD-10-CM | POA: Diagnosis present

## 2022-11-19 DIAGNOSIS — G4733 Obstructive sleep apnea (adult) (pediatric): Secondary | ICD-10-CM | POA: Diagnosis not present

## 2022-11-19 DIAGNOSIS — D62 Acute posthemorrhagic anemia: Secondary | ICD-10-CM | POA: Diagnosis not present

## 2022-11-19 DIAGNOSIS — K7581 Nonalcoholic steatohepatitis (NASH): Secondary | ICD-10-CM | POA: Diagnosis present

## 2022-11-19 DIAGNOSIS — I8510 Secondary esophageal varices without bleeding: Secondary | ICD-10-CM | POA: Diagnosis present

## 2022-11-19 DIAGNOSIS — I130 Hypertensive heart and chronic kidney disease with heart failure and stage 1 through stage 4 chronic kidney disease, or unspecified chronic kidney disease: Secondary | ICD-10-CM | POA: Diagnosis present

## 2022-11-19 DIAGNOSIS — Z951 Presence of aortocoronary bypass graft: Secondary | ICD-10-CM | POA: Diagnosis not present

## 2022-11-19 DIAGNOSIS — Z794 Long term (current) use of insulin: Secondary | ICD-10-CM | POA: Diagnosis not present

## 2022-11-19 DIAGNOSIS — R531 Weakness: Secondary | ICD-10-CM | POA: Diagnosis not present

## 2022-11-19 DIAGNOSIS — I8511 Secondary esophageal varices with bleeding: Secondary | ICD-10-CM | POA: Diagnosis present

## 2022-11-19 DIAGNOSIS — L89152 Pressure ulcer of sacral region, stage 2: Secondary | ICD-10-CM | POA: Diagnosis present

## 2022-11-19 DIAGNOSIS — K219 Gastro-esophageal reflux disease without esophagitis: Secondary | ICD-10-CM | POA: Diagnosis not present

## 2022-11-19 DIAGNOSIS — E2740 Unspecified adrenocortical insufficiency: Secondary | ICD-10-CM | POA: Diagnosis present

## 2022-11-19 DIAGNOSIS — N179 Acute kidney failure, unspecified: Secondary | ICD-10-CM | POA: Diagnosis not present

## 2022-11-19 DIAGNOSIS — E11649 Type 2 diabetes mellitus with hypoglycemia without coma: Secondary | ICD-10-CM | POA: Diagnosis present

## 2022-11-19 DIAGNOSIS — R188 Other ascites: Secondary | ICD-10-CM | POA: Diagnosis not present

## 2022-11-19 DIAGNOSIS — K7460 Unspecified cirrhosis of liver: Secondary | ICD-10-CM | POA: Diagnosis present

## 2022-11-19 DIAGNOSIS — J95821 Acute postprocedural respiratory failure: Secondary | ICD-10-CM | POA: Diagnosis not present

## 2022-11-19 DIAGNOSIS — I5033 Acute on chronic diastolic (congestive) heart failure: Secondary | ICD-10-CM | POA: Diagnosis not present

## 2022-11-19 DIAGNOSIS — N183 Chronic kidney disease, stage 3 unspecified: Secondary | ICD-10-CM

## 2022-11-19 DIAGNOSIS — D693 Immune thrombocytopenic purpura: Secondary | ICD-10-CM | POA: Diagnosis present

## 2022-11-19 DIAGNOSIS — E1169 Type 2 diabetes mellitus with other specified complication: Secondary | ICD-10-CM | POA: Diagnosis not present

## 2022-11-19 DIAGNOSIS — E1165 Type 2 diabetes mellitus with hyperglycemia: Secondary | ICD-10-CM | POA: Diagnosis present

## 2022-11-19 DIAGNOSIS — K766 Portal hypertension: Secondary | ICD-10-CM | POA: Diagnosis present

## 2022-11-19 DIAGNOSIS — K7469 Other cirrhosis of liver: Secondary | ICD-10-CM | POA: Diagnosis not present

## 2022-11-19 DIAGNOSIS — E1122 Type 2 diabetes mellitus with diabetic chronic kidney disease: Secondary | ICD-10-CM | POA: Diagnosis not present

## 2022-11-19 LAB — GLUCOSE, CAPILLARY
Glucose-Capillary: 168 mg/dL — ABNORMAL HIGH (ref 70–99)
Glucose-Capillary: 311 mg/dL — ABNORMAL HIGH (ref 70–99)
Glucose-Capillary: 340 mg/dL — ABNORMAL HIGH (ref 70–99)
Glucose-Capillary: 367 mg/dL — ABNORMAL HIGH (ref 70–99)

## 2022-11-19 LAB — AMYLASE, BODY FLUID (OTHER): Amylase, Body Fluid: 12 U/L

## 2022-11-19 LAB — CYTOLOGY - NON PAP

## 2022-11-19 MED ORDER — INSULIN ASPART 100 UNIT/ML IJ SOLN
3.0000 [IU] | Freq: Three times a day (TID) | INTRAMUSCULAR | Status: DC
  Administered 2022-11-19: 3 [IU] via SUBCUTANEOUS

## 2022-11-19 MED ORDER — OXYCODONE HCL 5 MG PO TABS
5.0000 mg | ORAL_TABLET | Freq: Three times a day (TID) | ORAL | Status: DC | PRN
  Administered 2022-11-19 – 2022-11-23 (×7): 5 mg via ORAL
  Filled 2022-11-19 (×7): qty 1

## 2022-11-19 MED ORDER — ORAL CARE MOUTH RINSE: 15.0000 mL | OROMUCOSAL | Status: DC | PRN

## 2022-11-19 MED ORDER — LIVING BETTER WITH HEART FAILURE BOOK: Freq: Once | Status: AC

## 2022-11-19 NOTE — Progress Notes (Signed)
Progress Note   Patient: [name]:[id number] DOB: 12/26/1960 DOA: 11/18/2022     0 DOS: the patient was seen and examined on 11/19/2022   Brief hospital course: ISIAC BREIGHNER is a 61 y.o. male with medical history significant of [name] cirrhosis, chronic kidney disease stage III 3 a, obstructive sleep apnea on CPAP, type 2 diabetes with nephropathy, anemia of chronic kidney disease, diastolic heart failure and class II obesity; who presented to the hospital secondary to generalized weakness, increased weight gain of more than 30 pounds, shortness of breath with activity and worsening ascites. Patient denies fever, chest pain, abdominal pain, nausea or vomiting, dysuria, hematuria, melena, hematochezia or focal neurologic deficit.  Expressed associated generalized weakness and overall decreased appetite due to early satiety.   Workup in the ED demonstrating elevated BNP, chest x-ray with vascular congestion and low lung volumes with atelectasis at the bases.  Significant ascites on physical exam.  TRH has been consulted to assist with hospital placement, diuresis management and paracentesis.  Assessment and Plan: * Ascites -will provide paracentesis and check fluid to rule out SBP -patient without fever, abdominal pain or elevated WBC's currently -albumin and lasix will be provided -continue use of aldactone -low sodium diet discussed with patient.  Obese -Body mass index is 35.49 kg/m. -class 2 obesity -low calorie diet and protion control discussed with patient.  Hyperlipidemia associated with type 2 diabetes mellitus ([place]) -continue statin  CKD stage 3 due to type 2 diabetes mellitus ([place]) -appears to be stable overall -class 3a at baseline -follow renal function trend with acute diuresis   Acute on chronic diastolic CHF (congestive heart failure) ([place]) -patient currently 30 pounds above usual weight -follow daily weight, strict intake/output and los sodium  diet -IV lasix initiated -continue treatment with aldactone and follow urine output, if needed will add enhancing  metolazone to his regimen. -follow clinical response, renal function and electrolytes during acute diuresis process. -recent echo demonstrating diastolic heart failure with preserved EF; will not repeat during this hospitalization.  GERD (gastroesophageal reflux disease) -continue PPI  NASH cirrhosis of liver (HCC) -stable LFT's and MELD currently -ascites appreciated -will continue low sodium diet -continue aldactone and diuresis with IV lasix -will provide paracentesis and give albumin -continue b-blocker, PPI and lactulose.  OSA on CPAP -expressed he has not been using CPAP lately  -will monitor and resume usage as needed.  S/P CABG x 4 -currently CP free -continue telemetry monitoring -continue aspirin, b-blocker, aldactone and statin    Subjective:  Still short winded with minimal activity, complaining of orthopnea and demonstrating fluid overload on examination.  No chest pain, no nausea, no vomiting, no abdominal pain.  Patient is afebrile.  Physical Exam: Vitals:   11/18/22 1833 11/18/22 1850 11/18/22 2218 11/19/22 0555  BP: (!) 104/59 (!) 104/55 108/62 (!) 115/56  Pulse: 78 78 76 76  Resp: 19 (!) 24 18 18   Temp: 98.3 F (36.8 C) 98 F (36.7 C) 98.1 F (36.7 C) 98.2 F (36.8 C)  TempSrc: Oral Oral    SpO2: 98% 100% 97% 100%  Weight:  113.2 kg  113.8 kg  Height:  5' 10.5" (1.791 m)     General exam: Alert, awake, oriented x 3; no chest pain, reports feeling better today.  Still with signs of fluid overload and complaining of short winded sensation with activity.  Patient expressed mild orthopnea. Respiratory system: Positive fine crackles, no using accessory muscle.  Good saturation on room air at rest.  Cardiovascular system:RRR. No rubs or Rubs; unable to properly assess JVD with body habitus. Gastrointestinal system: Abdomen is less distended,  positive weight fluids due to ascites and increased abdominal girth.  Positive bowel sounds.  No guarding, no pain. Central nervous system: Alert and oriented. No focal neurological deficits. Extremities: No cyanosis or clubbing; 2+ edema appreciated bilaterally. Skin: No petechiae. Psychiatry: Judgement and insight appear normal. Mood & affect appropriate.   Data Reviewed: TSH: 1.827 Ascitic fluid: With LDH of  93, 326 nucleated cell count; less than 3 protein, amylase 12.  Culture pending.  Family Communication: No family at bedside.  Disposition: Status [id number]   Planned Discharge Destination: Home   Time spent: 35 minutes  Author: Barton Dubois, MD 11/19/2022 12:47 PM  For on call review [email or web address].

## 2022-11-19 NOTE — Assessment & Plan Note (Addendum)
-  Status post paracentesis; 4 L removed -Cultures results pending -Continue treatment with IV diuretics, low-sodium diet, albumin and Aldactone. -LFTs overall stable at time of admission. -Continue to follow clinical response.

## 2022-11-19 NOTE — Assessment & Plan Note (Addendum)
-  Stable and at baseline currently -Continue to follow renal function trend with current diuresis -Chronic kidney disease a stage IIIa at baseline. -Maintain adequate oral hydration and minimize the use of nephrotoxic agents as much as possible.

## 2022-11-19 NOTE — Assessment & Plan Note (Addendum)
-  Continue statins -Heart healthy diet discussed with patient. -Continue treatment with a sliding scale insulin for his diabetes.

## 2022-11-19 NOTE — TOC Progression Note (Signed)
  Transition of Care [facility]) Screening Note   Patient Details  Name: [name] Date of Birth: 1961/01/11   [facility]) CM/SW Contact:    Shade Flood, LCSW Phone Number: 11/19/2022, 9:36 AM    Living Well with CHF education materials ordered for delivery to pt at bedside. Pt is established with Cardiology.  Transition of Care Department [facility]) has reviewed patient and no TOC needs have been identified at this time. We will continue to monitor patient advancement through interdisciplinary progression rounds. If new patient transition needs arise, please place a TOC consult.

## 2022-11-19 NOTE — Assessment & Plan Note (Addendum)
-  Body mass index is 35.49 kg/m. -class 2 obesity per BMI; appears to be in the setting of excess calorie intake. -Low-calorie diet, portion control and increase physical activity discussed with patient.

## 2022-11-19 NOTE — Progress Notes (Signed)
Patient slept through the nigh no complaints of pain.

## 2022-11-19 NOTE — Assessment & Plan Note (Addendum)
-  Patient remains chest pain-free -continue telemetry monitoring -continue aspirin, b-blocker, aldactone and statin -Continue outpatient follow-up with cardiology service.

## 2022-11-19 NOTE — Assessment & Plan Note (Addendum)
-  patient presented with at least 30 pounds above usual weight -His symptoms are improving with diuresis  -Continue IV diuresis, continue Aldactone -Continue daily weights, low-sodium diet and strict I's and O's. -Follow reds clip measurement as able.  Intake/Output Summary (Last 24 hours) at 11/20/2022 1528 Last data filed at 11/20/2022 1300 Gross per 24 hour  Intake 843 ml  Output 2100 ml  Net -1257 ml   Filed Weights   11/18/22 1850 11/19/22 0555 11/20/22 0440  Weight: 113.2 kg 113.8 kg 114.4 kg

## 2022-11-19 NOTE — Assessment & Plan Note (Addendum)
-  Importance of CPAP compliance discussed with patient -CPAP nightly has been ordered.

## 2022-11-19 NOTE — Assessment & Plan Note (Addendum)
Continue PPI ?

## 2022-11-19 NOTE — Assessment & Plan Note (Addendum)
-  stable LFT's and MELD score currently -Continue outpatient follow-up with gastroenterology service -Continue the use of beta-blocker, PPI, lactulose and Aldactone. -Continue treatment with IV Lasix. -IV albumin has been provided; follow clinical response.

## 2022-11-19 NOTE — Inpatient Diabetes Management (Signed)
Inpatient Diabetes Program Recommendations  AACE/ADA: New Consensus Statement on Inpatient Glycemic Control (2015)  Target Ranges:  Prepandial:   less than 140 mg/dL      Peak postprandial:   less than 180 mg/dL (1-2 hours)      Critically ill patients:  140 - 180 mg/dL   Lab Results  Component Value Date   GLUCAP 311 (H) 11/19/2022   HGBA1C 5.6 10/21/2022    Latest Reference Range & Units 11/18/22 12:24 11/18/22 17:34 11/18/22 22:22 11/19/22 07:20 11/19/22 11:04  Glucose-Capillary 70 - 99 mg/dL 187 (H) 218 (H) 245 (H) 168 (H) 311 (H)  (H): Data is abnormally high  Diabetes history: DM2 Outpatient Diabetes medications: Tresiba 52 units qd, Novolog 5 units tid [facility] Current orders for Inpatient glycemic control: Novolog 0-15 units tid  Inpatient Diabetes Program Recommendations:   Please consider: -Novolog 3 units tid meal coverage if eats 50%  Thank you, Bethena Roys E. Phoenix Dresser, RN, MSN, CDE  Diabetes Coordinator Inpatient Glycemic Control Team Team Pager [phone] (8am-5pm) 11/19/2022 11:30 AM

## 2022-11-20 DIAGNOSIS — K219 Gastro-esophageal reflux disease without esophagitis: Secondary | ICD-10-CM | POA: Diagnosis not present

## 2022-11-20 DIAGNOSIS — R188 Other ascites: Secondary | ICD-10-CM | POA: Diagnosis not present

## 2022-11-20 DIAGNOSIS — I5033 Acute on chronic diastolic (congestive) heart failure: Secondary | ICD-10-CM | POA: Diagnosis not present

## 2022-11-20 DIAGNOSIS — E1169 Type 2 diabetes mellitus with other specified complication: Secondary | ICD-10-CM | POA: Diagnosis not present

## 2022-11-20 LAB — BASIC METABOLIC PANEL
Anion gap: 6 (ref 5–15)
BUN: 25 mg/dL — ABNORMAL HIGH (ref 8–23)
CO2: 27 mmol/L (ref 22–32)
Calcium: 8.2 mg/dL — ABNORMAL LOW (ref 8.9–10.3)
Chloride: 97 mmol/L — ABNORMAL LOW (ref 98–111)
Creatinine, Ser: 1.28 mg/dL — ABNORMAL HIGH (ref 0.61–1.24)
GFR, Estimated: >60 mL/min (ref >60)
Glucose, Bld: 249 mg/dL — ABNORMAL HIGH (ref 70–99)
Potassium: 3.7 mmol/L (ref 3.5–5.1)
Sodium: 130 mmol/L — ABNORMAL LOW (ref 135–145)

## 2022-11-20 LAB — GLUCOSE, CAPILLARY
Glucose-Capillary: 242 mg/dL — ABNORMAL HIGH (ref 70–99)
Glucose-Capillary: 271 mg/dL — ABNORMAL HIGH (ref 70–99)
Glucose-Capillary: 234 mg/dL — ABNORMAL HIGH (ref 70–99)
Glucose-Capillary: 156 mg/dL — ABNORMAL HIGH (ref 70–99)
Glucose-Capillary: 163 mg/dL — ABNORMAL HIGH (ref 70–99)

## 2022-11-20 MED ORDER — INSULIN GLARGINE-YFGN 100 UNIT/ML ~~LOC~~ SOLN
25.0000 [IU] | Freq: Every day | SUBCUTANEOUS | Status: DC
  Administered 2022-11-20 – 2022-11-23 (×4): 25 [IU] via SUBCUTANEOUS
  Filled 2022-11-20 (×6): qty 0.25

## 2022-11-20 MED ORDER — INSULIN ASPART 100 UNIT/ML IJ SOLN
12.0000 [IU] | Freq: Three times a day (TID) | INTRAMUSCULAR | Status: DC
  Administered 2022-11-20 – 2022-11-21 (×4): 12 [IU] via SUBCUTANEOUS

## 2022-11-20 MED ORDER — INSULIN ASPART 100 UNIT/ML IJ SOLN
10.0000 [IU] | Freq: Three times a day (TID) | INTRAMUSCULAR | Status: DC
  Administered 2022-11-20 (×2): 10 [IU] via SUBCUTANEOUS

## 2022-11-20 NOTE — Progress Notes (Signed)
Progress Note   Patient: [name] WHQ:[id number] DOB: 06-Dec-1961 DOA: 11/18/2022     1 DOS: the patient was seen and examined on 11/20/2022   Brief hospital course: [name] is a 61 y.o. male with medical history significant of [name] cirrhosis, chronic kidney disease stage III 3 a, obstructive sleep apnea on CPAP, type 2 diabetes with nephropathy, anemia of chronic kidney disease, diastolic heart failure and class II obesity; who presented to the hospital secondary to generalized weakness, increased weight gain of more than 30 pounds, shortness of breath with activity and worsening ascites. Patient denies fever, chest pain, abdominal pain, nausea or vomiting, dysuria, hematuria, melena, hematochezia or focal neurologic deficit.  Expressed associated generalized weakness and overall decreased appetite due to early satiety.   Workup in the ED demonstrating elevated BNP, chest x-ray with vascular congestion and low lung volumes with atelectasis at the bases.  Significant ascites on physical exam.  TRH has been consulted to assist with hospital placement, diuresis management and paracentesis.  Assessment and Plan: * Ascites -Status post paracentesis; 4 L removed -Cultures results pending -Continue treatment with IV diuretics, low-sodium diet, albumin and Aldactone. -LFTs overall stable at time of admission. -Continue to follow clinical response.    Obese -Body mass index is 35.49 kg/m. -class 2 obesity per BMI; appears to be in the setting of excess calorie intake. -Low-calorie diet, portion control and increase physical activity discussed with patient.   Hyperlipidemia associated with type 2 diabetes mellitus ([place]) -Continue statin -Heart healthy diet discussed with patient. -Continue treatment with a sliding scale insulin for his diabetes -uncontrolled at this time, added prandial and basal coverage CBG (last 3)  Recent Labs    11/19/22 2120 11/20/22 0706  11/20/22 1150  GLUCAP 367* 242* 271*     CKD stage 3 due to type 2 diabetes mellitus (HCC) -Stable and at baseline at this time -Continue to follow renal function trend with current diuresis -Chronic kidney disease a stage IIIa at baseline. -Maintain adequate oral hydration and minimize the use of nephrotoxic agents as much as possible.   Acute on chronic diastolic CHF (congestive heart failure) ([place]) -patient presented with at least 30 pounds above usual weight -His symptoms are improving with diuresis  -Continue IV diuresis, continue Aldactone -Continue daily weights, low-sodium diet and strict I's and O's. -Follow reds clip measurement as able.  Intake/Output Summary (Last 24 hours) at 11/20/2022 1528 Last data filed at 11/20/2022 1300 Gross per 24 hour  Intake 843 ml  Output 2100 ml  Net -1257 ml   Filed Weights   11/18/22 1850 11/19/22 0555 11/20/22 0440  Weight: 113.2 kg 113.8 kg 114.4 kg     GERD (gastroesophageal reflux disease) -Continue PPI for GI protection  NASH cirrhosis of liver (HCC) -stable LFT's and MELD score currently -Continue outpatient follow-up with gastroenterology service -Continue the use of beta-blocker, PPI, lactulose and Aldactone. -Continue treatment with IV Lasix. -IV albumin has been provided; follow clinical response.  OSA on CPAP -Importance of CPAP compliance discussed with patient -CPAP nightly has been ordered.  S/P CABG x 4 -Patient remains chest pain-free -continue telemetry monitoring -continue aspirin, b-blocker, aldactone and statin -Continue outpatient follow-up with cardiology service.  Subjective:  Pt reports he continues to urinate frequently, no abd pain, no SOB, no CP.   Physical Exam: Vitals:   11/20/22 0426 11/20/22 0440 11/20/22 0836 11/20/22 1300  BP: 111/60  113/64 (!) 112/53  Pulse: 70  79 69  Resp: 17  18  Temp: 98 F (36.7 C)   98.2 F (36.8 C)  TempSrc:    Oral  SpO2: 98%   97%  Weight:  114.4  kg    Height:       General exam: awake, alert, cooperative, lying supine, NAD.  Respiratory system: BBS shallow but clear to auscultation Cardiovascular system: normal s1, s2 sounds.  Gastrointestinal system: Abdomen is distended, tympanitic;Positive bowel sounds.  Central nervous system: Alert and oriented. No focal neurological deficits. Extremities: No cyanosis or clubbing; 2+ edema appreciated bilaterally. Skin: No petechiae. Psychiatry: Judgement and insight appear normal. Mood & affect appropriate.   Data Reviewed: TSH: 1.827 Ascitic fluid: With LDH of  93, 326 nucleated cell count; less than 3 protein, amylase 12.  Culture pending.  Family Communication: attempted to call wife, no answer x 2, left message 12/9  Disposition: Status [id number]   Planned Discharge Destination: Home    Time spent: 35 minutes  Author: Irwin Brakeman, MD 11/20/2022 3:30 PM  For on call review [email or web address].

## 2022-11-21 DIAGNOSIS — R188 Other ascites: Secondary | ICD-10-CM | POA: Diagnosis not present

## 2022-11-21 DIAGNOSIS — E1169 Type 2 diabetes mellitus with other specified complication: Secondary | ICD-10-CM | POA: Diagnosis not present

## 2022-11-21 DIAGNOSIS — G4733 Obstructive sleep apnea (adult) (pediatric): Secondary | ICD-10-CM | POA: Diagnosis not present

## 2022-11-21 DIAGNOSIS — Z951 Presence of aortocoronary bypass graft: Secondary | ICD-10-CM | POA: Diagnosis not present

## 2022-11-21 LAB — GLUCOSE, CAPILLARY
Glucose-Capillary: 133 mg/dL — ABNORMAL HIGH (ref 70–99)
Glucose-Capillary: 172 mg/dL — ABNORMAL HIGH (ref 70–99)
Glucose-Capillary: 312 mg/dL — ABNORMAL HIGH (ref 70–99)
Glucose-Capillary: 282 mg/dL — ABNORMAL HIGH (ref 70–99)
Glucose-Capillary: 160 mg/dL — ABNORMAL HIGH (ref 70–99)

## 2022-11-21 LAB — CBC
HCT: 28.6 % — ABNORMAL LOW (ref 39.0–52.0)
Hemoglobin: 9.6 g/dL — ABNORMAL LOW (ref 13.0–17.0)
MCH: 31.9 pg (ref 26.0–34.0)
MCHC: 33.6 g/dL (ref 30.0–36.0)
MCV: 95 fL (ref 80.0–100.0)
Platelets: 125 10*3/uL — ABNORMAL LOW (ref 150–400)
RBC: 3.01 MIL/uL — ABNORMAL LOW (ref 4.22–5.81)
RDW: 17.2 % — ABNORMAL HIGH (ref 11.5–15.5)
WBC: 4.6 10*3/uL (ref 4.0–10.5)
nRBC: 0 % (ref 0.0–0.2)

## 2022-11-21 LAB — BASIC METABOLIC PANEL
Anion gap: 6 (ref 5–15)
BUN: 24 mg/dL — ABNORMAL HIGH (ref 8–23)
CO2: 27 mmol/L (ref 22–32)
Calcium: 8.3 mg/dL — ABNORMAL LOW (ref 8.9–10.3)
Chloride: 97 mmol/L — ABNORMAL LOW (ref 98–111)
Creatinine, Ser: 1.28 mg/dL — ABNORMAL HIGH (ref 0.61–1.24)
GFR, Estimated: >60 mL/min (ref >60)
Glucose, Bld: 152 mg/dL — ABNORMAL HIGH (ref 70–99)
Potassium: 4.3 mmol/L (ref 3.5–5.1)
Sodium: 130 mmol/L — ABNORMAL LOW (ref 135–145)

## 2022-11-21 MED ORDER — INSULIN ASPART 100 UNIT/ML IJ SOLN
13.0000 [IU] | Freq: Three times a day (TID) | INTRAMUSCULAR | Status: DC
  Administered 2022-11-22 – 2022-11-23 (×5): 13 [IU] via SUBCUTANEOUS

## 2022-11-21 NOTE — Progress Notes (Signed)
Progress Note   Patient: [name] IRJ:[id number] DOB: 05-26-1961 DOA: 11/18/2022     2 DOS: the patient was seen and examined on 11/21/2022   Brief hospital course: [name] is a 61 y.o. male with medical history significant of [name] cirrhosis, chronic kidney disease stage III 3 a, obstructive sleep apnea on CPAP, type 2 diabetes with nephropathy, anemia of chronic kidney disease, diastolic heart failure and class II obesity; who presented to the hospital secondary to generalized weakness, increased weight gain of more than 30 pounds, shortness of breath with activity and worsening ascites. Patient denies fever, chest pain, abdominal pain, nausea or vomiting, dysuria, hematuria, melena, hematochezia or focal neurologic deficit.  Expressed associated generalized weakness and overall decreased appetite due to early satiety.   Workup in the ED demonstrating elevated BNP, chest x-ray with vascular congestion and low lung volumes with atelectasis at the bases.  Significant ascites on physical exam.  TRH has been consulted to assist with hospital placement, diuresis management and paracentesis.  Assessment and Plan: * Ascites -Status post paracentesis; 4 L removed -Cultures results pending -Continue treatment with IV diuretics, low-sodium diet, albumin and Aldactone. -LFTs overall stable at time of admission. -Continue to follow clinical response.    Obese -Body mass index is 35.49 kg/m. -class 2 obesity per BMI; appears to be in the setting of excess calorie intake. -Low-calorie diet, portion control and increase physical activity discussed with patient.   Hyperlipidemia associated with type 2 diabetes mellitus ([place]) -Continue statin -Heart healthy diet discussed with patient. -Continue treatment with a sliding scale insulin for his diabetes -uncontrolled at this time, added prandial and basal coverage CBG (last 3)  Recent Labs    11/19/22 2120 11/20/22 0706  11/20/22 1150  GLUCAP 367* 242* 271*     CKD stage 3 due to type 2 diabetes mellitus (HCC) -Stable and at baseline at this time -Continue to follow renal function trend with current diuresis -Chronic kidney disease a stage IIIa at baseline. -Maintain adequate oral hydration and minimize the use of nephrotoxic agents as much as possible.   Acute on chronic diastolic CHF (congestive heart failure) ([place]) -patient presented with at least 30 pounds above usual weight -His symptoms are improving with diuresis  -Continue IV diuresis, continue Aldactone -Continue daily weights, low-sodium diet and strict I's and O's. -Follow reds clip measurement as able.  Intake/Output Summary (Last 24 hours) at 11/20/2022 1528 Last data filed at 11/20/2022 1300 Gross per 24 hour  Intake 843 ml  Output 2100 ml  Net -1257 ml   Filed Weights   11/18/22 1850 11/19/22 0555 11/20/22 0440  Weight: 113.2 kg 113.8 kg 114.4 kg     GERD (gastroesophageal reflux disease) -Continue PPI for GI protection  NASH cirrhosis of liver (HCC) -stable LFT's and MELD score currently -Continue outpatient follow-up with gastroenterology service -Continue the use of beta-blocker, PPI, lactulose and Aldactone. -Continue treatment with IV Lasix. -IV albumin has been provided; follow clinical response.  OSA on CPAP -Importance of CPAP compliance discussed with patient -CPAP nightly has been ordered.  S/P CABG x 4 -Patient remains chest pain-free -continue telemetry monitoring -continue aspirin, b-blocker, aldactone and statin -Continue outpatient follow-up with cardiology service.  Physical deconditioning -PT assessment will be requested  Subjective:  No fever, no chest pain, no nausea or vomiting.  Patient still short winded with minimal activity and expressing feeling weak and deconditioned.  Positive signs of fluid appreciated exam.  Physical Exam: Vitals:   11/20/22  2212 11/21/22 0204 11/21/22 0328  11/21/22 1335  BP: (!) 103/53  102/74 121/67  Pulse: 65 67 67 77  Resp: 16 16 20 17   Temp: 97.8 F (36.6 C)  98.2 F (36.8 C) 98.3 F (36.8 C)  TempSrc: Oral  Oral Oral  SpO2: 96% 97% 98% 96%  Weight:   113.3 kg   Height:       General exam: Alert, awake, oriented x 3; reporting feeling weak and deconditioned and is still short winded with activity.  Fluid overload appreciated on exam. Respiratory system: Decreased breath sounds at the bases appreciated; no wheezing.  Respiratory effort normal.  No using oxygen supplementation Cardiovascular system:RRR. No murmurs, rubs, gallops.  Unable to properly assess JVD with body habitus. Gastrointestinal system: Abdomen is obese, distended with positive fluid wave (improving comparison to admission); soft and nontender. No organomegaly or masses felt. Normal bowel sounds heard. Central nervous system:No focal neurological deficits. Extremities: No cyanosis or clubbing.  2+ edema appreciated bilaterally. Skin: No petechiae. Psychiatry: Judgement and insight appear normal. Mood & affect appropriate.    Data Reviewed: TSH: 6.734 Basic metabolic panel: Sodium 193, potassium 4.3, chloride 97, bicarb 27, BUN 24, creatinine 1.28 and GFR 60 CBC: WBCs 4.6, hemoglobin 9.6 and platelet count 120 5K  Family Communication: No family at bedside.  Disposition: Status [id number]   Planned Discharge Destination: Home   Time spent: 35 minutes  Author: Barton Dubois, MD 11/21/2022 5:03 PM  For on call review [email or web address].

## 2022-11-22 DIAGNOSIS — R188 Other ascites: Secondary | ICD-10-CM | POA: Diagnosis not present

## 2022-11-22 DIAGNOSIS — G4733 Obstructive sleep apnea (adult) (pediatric): Secondary | ICD-10-CM | POA: Diagnosis not present

## 2022-11-22 DIAGNOSIS — Z951 Presence of aortocoronary bypass graft: Secondary | ICD-10-CM | POA: Diagnosis not present

## 2022-11-22 DIAGNOSIS — E1169 Type 2 diabetes mellitus with other specified complication: Secondary | ICD-10-CM | POA: Diagnosis not present

## 2022-11-22 LAB — BASIC METABOLIC PANEL
Anion gap: 6 (ref 5–15)
BUN: 25 mg/dL — ABNORMAL HIGH (ref 8–23)
CO2: 28 mmol/L (ref 22–32)
Calcium: 8.2 mg/dL — ABNORMAL LOW (ref 8.9–10.3)
Chloride: 97 mmol/L — ABNORMAL LOW (ref 98–111)
Creatinine, Ser: 1.34 mg/dL — ABNORMAL HIGH (ref 0.61–1.24)
GFR, Estimated: 60 mL/min — ABNORMAL LOW (ref >60)
Glucose, Bld: 166 mg/dL — ABNORMAL HIGH (ref 70–99)
Potassium: 4.3 mmol/L (ref 3.5–5.1)
Sodium: 131 mmol/L — ABNORMAL LOW (ref 135–145)

## 2022-11-22 LAB — GLUCOSE, CAPILLARY
Glucose-Capillary: 158 mg/dL — ABNORMAL HIGH (ref 70–99)
Glucose-Capillary: 142 mg/dL — ABNORMAL HIGH (ref 70–99)
Glucose-Capillary: 259 mg/dL — ABNORMAL HIGH (ref 70–99)
Glucose-Capillary: 178 mg/dL — ABNORMAL HIGH (ref 70–99)
Glucose-Capillary: 195 mg/dL — ABNORMAL HIGH (ref 70–99)

## 2022-11-22 NOTE — Progress Notes (Signed)
Still watching tv holding CPAP for now.

## 2022-11-22 NOTE — Progress Notes (Signed)
Progress Note   Patient: [name] WOE:[id number] DOB: 01-26-1961 DOA: 11/18/2022     3 DOS: the patient was seen and examined on 11/22/2022   Brief hospital course: [name] is a 61 y.o. male with medical history significant of [name] cirrhosis, chronic kidney disease stage III 3 a, obstructive sleep apnea on CPAP, type 2 diabetes with nephropathy, anemia of chronic kidney disease, diastolic heart failure and class II obesity; who presented to the hospital secondary to generalized weakness, increased weight gain of more than 30 pounds, shortness of breath with activity and worsening ascites. Patient denies fever, chest pain, abdominal pain, nausea or vomiting, dysuria, hematuria, melena, hematochezia or focal neurologic deficit.  Expressed associated generalized weakness and overall decreased appetite due to early satiety.   Workup in the ED demonstrating elevated BNP, chest x-ray with vascular congestion and low lung volumes with atelectasis at the bases.  Significant ascites on physical exam.  TRH has been consulted to assist with hospital placement, diuresis management and paracentesis.  Assessment and Plan: * Ascites -Status post paracentesis; 4 L removed -Cultures results pending -Continue treatment with IV diuretics, low-sodium diet, albumin and Aldactone. -LFTs overall stable at time of admission. -Continue to follow clinical response.    Obese -Body mass index is 35.49 kg/m. -class 2 obesity per BMI; appears to be in the setting of excess calorie intake. -Low-calorie diet, portion control and increase physical activity discussed with patient.   Hyperlipidemia associated with type 2 diabetes mellitus ([name]) -Continue statin -Heart healthy diet discussed with patient. -Continue treatment with a sliding scale insulin for his diabetes -uncontrolled at this time, added prandial and basal coverage CBG (last 3)  Recent Labs    11/19/22 2120 11/20/22 0706  11/20/22 1150  GLUCAP 367* 242* 271*     CKD stage 3 due to type 2 diabetes mellitus (HCC) -Stable and at baseline at this time -Continue to follow renal function trend with current diuresis -Chronic kidney disease a stage IIIa at baseline. -Maintain adequate oral hydration and minimize the use of nephrotoxic agents as much as possible.   Acute on chronic diastolic CHF (congestive heart failure) ([place]) -patient presented with at least 30 pounds above usual weight -His symptoms are improving with diuresis  -Continue IV diuresis, continue Aldactone -Continue daily weights, low-sodium diet and strict I's and O's. -Follow reds clip measurement as able.  Intake/Output Summary (Last 24 hours) at 11/20/2022 1528 Last data filed at 11/20/2022 1300 Gross per 24 hour  Intake 843 ml  Output 2100 ml  Net -1257 ml   Filed Weights   11/18/22 1850 11/19/22 0555 11/20/22 0440  Weight: 113.2 kg 113.8 kg 114.4 kg     GERD (gastroesophageal reflux disease) -Continue PPI for GI protection  NASH cirrhosis of liver (HCC) -stable LFT's and MELD score currently -Continue outpatient follow-up with gastroenterology service -Continue the use of beta-blocker, PPI, lactulose and Aldactone. -Continue treatment with IV Lasix. -IV albumin has been provided; follow clinical response.  OSA on CPAP -Importance of CPAP compliance discussed with patient -CPAP nightly has been ordered.  S/P CABG x 4 -Patient remains chest pain-free -continue telemetry monitoring -continue aspirin, b-blocker, aldactone and statin -Continue outpatient follow-up with cardiology service.  Physical deconditioning -Physical therapy has seen patient and recommended home health PT at discharge.  Subjective:  No fever, no chest pain, no nausea, no vomiting.  No overt bleeding reported.  Patient expressed improvement in his breathing and is just feeling weak and deconditioned.  Physical  Exam: Vitals:   11/21/22 1335  11/21/22 2113 11/22/22 0528 11/22/22 1349  BP: 121/67 96/66 100/65 111/80  Pulse: 77 73 73 71  Resp: 17 17 17 17   Temp: 98.3 F (36.8 C) 98.2 F (36.8 C) 98.4 F (36.9 C) 98.4 F (36.9 C)  TempSrc: Oral Oral Oral   SpO2: 96% 100% 97% 100%  Weight:   113.6 kg   Height:       General exam: Alert, awake, oriented x 3; no chest pain, no nausea, no vomiting; reports feeling better and breathing easier.  No abdominal pain and not overt bleeding reported. Respiratory system: Improving movement bilaterally; good saturation on room air and no using accessory muscles.  Decreased breath sounds at the bases appreciated. Cardiovascular system:RRR. No murmurs, rubs, gallops. Gastrointestinal system: Abdomen is distended, nontender, positive fluid wave from ascites (less than at time of admission), positive bowel sounds.  No guarding. Central nervous system: Alert and oriented. No focal neurological deficits. Extremities: No cyanosis; trace to 1+ edema appreciated bilaterally. Skin: No petechiae. Psychiatry: Judgement and insight appear normal. Mood & affect appropriate.   Data Reviewed: TSH: 1.827 CBC: WBCs 4.6, hemoglobin 9.6 and platelet count 683 5K Basic metabolic panel: Sodium 729, potassium 4.3, chloride 97, bicarb 28, BUN 25 and creatinine 1.34  Family Communication: No family at bedside.  Disposition: Status MS:[id number]   Planned Discharge Destination: Home    Time spent: 35 minutes  Author: Barton Dubois, MD 11/22/2022 3:39 PM  For on call review [email or web address].

## 2022-11-22 NOTE — TOC Progression Note (Signed)
Transition of Care [facility]) - Progression Note    Patient Details  Name: DAVARI LOPES MRN: [id number] Date of Birth: 1961-01-22  Transition of Care [facility]) CM/SW Contact  Salome Arnt, [place] Phone Number: 11/22/2022, 11:09 AM  Clinical Narrative: [facility] accepted pt for HHPT. Pt notified. TOC will continue to follow.       Expected Discharge Plan: [place] Barriers to Discharge: Continued Medical Work up  Expected Discharge Plan and Services Expected Discharge Plan: [place] In-house Referral: Clinical Social Work   Post Acute Care Choice: [place] arrangements for the past 2 months: Single Family Home                                       Social Determinants of Health (SDOH) Interventions    Readmission Risk Interventions    11/22/2022    8:40 AM 08/25/2021    2:25 PM 07/22/2021   11:48 AM  Readmission Risk Prevention Plan  Transportation Screening Complete Complete Complete  HRI or Home Care Consult   Complete  Social Work Consult for [place] Planning/Counseling   Complete  Palliative Care Screening   Not Applicable  Medication Review Press photographer) Complete Complete Complete  HRI or Home Care Consult Complete Complete   SW Recovery Care/Counseling Consult Complete Complete   Palliative Care Screening Not Applicable Not Applicable   Skilled Nursing Facility Not Applicable Complete

## 2022-11-22 NOTE — Plan of Care (Signed)
  Problem: Acute Rehab PT Goals(only PT should resolve) Goal: Pt Will Go Supine/Side To Sit Outcome: Progressing Flowsheets (Taken 11/22/2022 1443) Pt will go Supine/Side to Sit:  with supervision  with min guard assist Goal: Patient Will Transfer Sit To/From Stand 11/22/2022 1443 by Lonell Grandchild, PT Flowsheets (Taken 11/22/2022 1443) Patient will transfer sit to/from stand:  with supervision  with min guard assist 11/22/2022 1443 by Lonell Grandchild, PT Outcome: Progressing Goal: Pt Will Transfer Bed To Chair/Chair To Bed 11/22/2022 1443 by Lonell Grandchild, PT Flowsheets (Taken 11/22/2022 1443) Pt will Transfer Bed to Chair/Chair to Bed:  min guard assist  with min assist 11/22/2022 1443 by Lonell Grandchild, PT Outcome: Progressing Goal: Pt Will Ambulate 11/22/2022 1443 by Lonell Grandchild, PT Flowsheets (Taken 11/22/2022 1443) Pt will Ambulate:  50 feet  with min guard assist  with rolling walker  with supervision 11/22/2022 1443 by Lonell Grandchild, PT Outcome: Progressing   2:44 PM, 11/22/22 Lonell Grandchild, MPT Physical Therapist with [facility] [phone] office [phone] mobile phone

## 2022-11-22 NOTE — Plan of Care (Signed)
  Problem: Education: Goal: Understanding of cardiac disease, CV risk reduction, and recovery process will improve Outcome: Progressing Goal: Individualized Educational Video(s) Outcome: Progressing   Problem: Activity: Goal: Ability to tolerate increased activity will improve Outcome: Progressing   Problem: Cardiac: Goal: Ability to achieve and maintain adequate cardiovascular perfusion will improve Outcome: Progressing   Problem: Health Behavior/Discharge Planning: Goal: Ability to safely manage health-related needs after discharge will improve Outcome: Progressing   Problem: Education: Goal: Knowledge of General Education information will improve Description: Including pain rating scale, medication(s)/side effects and non-pharmacologic comfort measures Outcome: Progressing   Problem: Health Behavior/Discharge Planning: Goal: Ability to manage health-related needs will improve Outcome: Progressing   Problem: Clinical Measurements: Goal: Ability to maintain clinical measurements within normal limits will improve Outcome: Progressing Goal: Will remain free from infection Outcome: Progressing Goal: Diagnostic test results will improve Outcome: Progressing Goal: Respiratory complications will improve Outcome: Progressing Goal: Cardiovascular complication will be avoided Outcome: Progressing   Problem: Activity: Goal: Risk for activity intolerance will decrease Outcome: Progressing   Problem: Nutrition: Goal: Adequate nutrition will be maintained Outcome: Progressing   Problem: Coping: Goal: Level of anxiety will decrease Outcome: Progressing   Problem: Elimination: Goal: Will not experience complications related to bowel motility Outcome: Progressing Goal: Will not experience complications related to urinary retention Outcome: Progressing   Problem: Pain Managment: Goal: General experience of comfort will improve Outcome: Progressing   Problem:  Safety: Goal: Ability to remain free from injury will improve Outcome: Progressing   Problem: Skin Integrity: Goal: Risk for impaired skin integrity will decrease Outcome: Progressing   Problem: Education: Goal: Ability to demonstrate management of disease process will improve Outcome: Progressing Goal: Ability to verbalize understanding of medication therapies will improve Outcome: Progressing Goal: Individualized Educational Video(s) Outcome: Progressing   Problem: Activity: Goal: Capacity to carry out activities will improve Outcome: Progressing   Problem: Cardiac: Goal: Ability to achieve and maintain adequate cardiopulmonary perfusion will improve Outcome: Progressing   Problem: Education: Goal: Ability to describe self-care measures that may prevent or decrease complications (Diabetes Survival Skills Education) will improve Outcome: Progressing Goal: Individualized Educational Video(s) Outcome: Progressing   Problem: Coping: Goal: Ability to adjust to condition or change in health will improve Outcome: Progressing   Problem: Fluid Volume: Goal: Ability to maintain a balanced intake and output will improve Outcome: Progressing   Problem: Health Behavior/Discharge Planning: Goal: Ability to identify and utilize available resources and services will improve Outcome: Progressing Goal: Ability to manage health-related needs will improve Outcome: Progressing   Problem: Metabolic: Goal: Ability to maintain appropriate glucose levels will improve Outcome: Progressing   Problem: Nutritional: Goal: Maintenance of adequate nutrition will improve Outcome: Progressing Goal: Progress toward achieving an optimal weight will improve Outcome: Progressing   Problem: Skin Integrity: Goal: Risk for impaired skin integrity will decrease Outcome: Progressing   Problem: Tissue Perfusion: Goal: Adequacy of tissue perfusion will improve Outcome: Progressing

## 2022-11-22 NOTE — Evaluation (Signed)
Physical Therapy Evaluation Patient Details Name: [name] MRN: [id number] DOB: 1961-04-12 Today's Date: 11/22/2022  History of Present Illness  [name] is a 61 y.o. male with medical history significant of [name] cirrhosis, chronic kidney disease stage III 3 a, obstructive sleep apnea on CPAP, type 2 diabetes with nephropathy, anemia of chronic kidney disease, diastolic heart failure and class II obesity; who presented to the hospital secondary to generalized weakness, increased weight gain of more than 30 pounds, shortness of breath with activity and worsening ascites.  Patient denies fever, chest pain, abdominal pain, nausea or vomiting, dysuria, hematuria, melena, hematochezia or focal neurologic deficit.  Expressed associated generalized weakness and overall decreased appetite due to early satiety.   Clinical Impression  Patient demonstrates labored movement for sitting up at bedside requiring HOB partially raised, use of bed rail and Min assist with most difficulty scooting to EOB.  Patient required verbal/tactile cue for proper hand place during sit to stands with fair/good carryover, able to ambulate up to doorway and back without loss of balance and tolerated sitting up in chair after therapy - nursing staff aware.  Patient will benefit from continued skilled physical therapy in hospital and recommended venue below to increase strength, balance, endurance for safe ADLs and gait.      Recommendations for follow up therapy are one component of a multi-disciplinary discharge planning process, led by the attending physician.  Recommendations may be updated based on patient status, additional functional criteria and insurance authorization.  Follow Up Recommendations Home health PT      Assistance Recommended at Discharge Set up Supervision/Assistance  Patient can return home with the following  A little help with walking and/or transfers;A little help with  bathing/dressing/bathroom;Help with stairs or ramp for entrance;Assistance with cooking/housework    Equipment Recommendations None recommended by PT  Recommendations for Other Services       Functional Status Assessment Patient has had a recent decline in their functional status and demonstrates the ability to make significant improvements in function in a reasonable and predictable amount of time.     Precautions / Restrictions Precautions Precautions: Fall Restrictions Weight Bearing Restrictions: No      Mobility  Bed Mobility Overal bed mobility: Needs Assistance Bed Mobility: Rolling, Sidelying to Sit Rolling: Min assist Sidelying to sit: Min assist, Mod assist, HOB elevated       General bed mobility comments: increased time, labored movement requiring use of bed rail with HOB partially raised, verbal/tacile cueing for properly scooting to EOB    Transfers Overall transfer level: Needs assistance Equipment used: Rolling walker (2 wheels) Transfers: Sit to/from Stand, Bed to chair/wheelchair/BSC Sit to Stand: Min guard, Min assist   Step pivot transfers: Min guard, Min assist       General transfer comment: increased time, labored movement    Ambulation/Gait Ambulation/Gait assistance: Min guard, Min assist Gait Distance (Feet): 35 Feet Assistive device: Rolling walker (2 wheels) Gait Pattern/deviations: Decreased step length - right, Decreased step length - left, Decreased stride length Gait velocity: decreased     General Gait Details: slow labored cadence without loss of balance, limited mostly due to fatigue  Stairs            Wheelchair Mobility    Modified Rankin (Stroke Patients Only)       Balance Overall balance assessment: Needs assistance Sitting-balance support: Feet supported, No upper extremity supported Sitting balance-Leahy Scale: Fair Sitting balance - Comments: fair/good seated at EOB  Standing balance support: During  functional activity, Bilateral upper extremity supported, Reliant on assistive device for balance Standing balance-Leahy Scale: Fair Standing balance comment: using RW                             Pertinent Vitals/Pain Pain Assessment Pain Assessment: 0-10 Pain Score: 8  Pain Location: mostly right shoulder and hip and Pain Descriptors / Indicators: Sore, Discomfort Pain Intervention(s): Limited activity within patient's tolerance, Monitored during session, Repositioned    Home Living Family/patient expects to be discharged to:: Private residence Living Arrangements: Spouse/significant other Available Help at Discharge: Family;Available 24 hours/day Type of Home: House Home Access: Stairs to enter Entrance Stairs-Rails: Left Entrance Stairs-Number of Steps: 3   Home Layout: One level Home Equipment: Conservation officer, nature (2 wheels);Cane - single point;Cane - quad      Prior Function Prior Level of Function : Needs assist       Physical Assist : Mobility (physical);ADLs (physical) Mobility (physical): Bed mobility;Transfers;Gait;Stairs   Mobility Comments: household ambulator using RW ADLs Comments: assisted by family     Hand Dominance   Dominant Hand: Right    Extremity/Trunk Assessment   Upper Extremity Assessment Upper Extremity Assessment: Generalized weakness    Lower Extremity Assessment Lower Extremity Assessment: Generalized weakness    Cervical / Trunk Assessment Cervical / Trunk Assessment: Normal  Communication   Communication: No difficulties  Cognition Arousal/Alertness: Awake/alert Behavior During Therapy: WFL for tasks assessed/performed Overall Cognitive Status: Within Functional Limits for tasks assessed                                          General Comments      Exercises     Assessment/Plan    PT Assessment Patient needs continued PT services  PT Problem List Decreased strength;Decreased activity  tolerance;Decreased balance;Decreased mobility       PT Treatment Interventions DME instruction;Gait training;Stair training;Functional mobility training;Therapeutic activities;Therapeutic exercise;Patient/family education;Balance training    PT Goals (Current goals can be found in the Care Plan section)  Acute Rehab PT Goals Patient Stated Goal: return home with family to assist PT Goal Formulation: With patient Time For Goal Achievement: 11/26/22 Potential to Achieve Goals: Good    Frequency Min 3X/week     Co-evaluation               AM-PAC PT "6 Clicks" Mobility  Outcome Measure Help needed turning from your back to your side while in a flat bed without using bedrails?: A Little Help needed moving from lying on your back to sitting on the side of a flat bed without using bedrails?: A Lot Help needed moving to and from a bed to a chair (including a wheelchair)?: A Little Help needed standing up from a chair using your arms (e.g., wheelchair or bedside chair)?: A Little Help needed to walk in hospital room?: A Little Help needed climbing 3-5 steps with a railing? : A Lot 6 Click Score: 16    End of Session   Activity Tolerance: Patient tolerated treatment well;Patient limited by fatigue Patient left: in chair;with call bell/phone within reach Nurse Communication: Mobility status PT Visit Diagnosis: Unsteadiness on feet (R26.81);Other abnormalities of gait and mobility (R26.89);Muscle weakness (generalized) (M62.81)    Time: 7893-8101 PT Time Calculation (min) (ACUTE ONLY): 31 min   Charges:   PT  Evaluation $PT Eval Moderate Complexity: 1 Mod PT Treatments $Therapeutic Activity: 23-37 mins        2:42 PM, 11/22/22 Lonell Grandchild, MPT Physical Therapist with [facility] [phone] office [phone] mobile phone

## 2022-11-22 NOTE — TOC Initial Note (Signed)
Transition of Care [facility]) - Initial/Assessment Note    Patient Details  Name: [name] MRN: [id number] Date of Birth: 1961/12/01  Transition of Care [facility]) CM/SW Contact:    Salome Arnt, [place] Phone Number: 11/22/2022, 8:43 AM  Clinical Narrative: Pt admitted for ascites. Assessment completed with pt due to high risk readmission score. Pt reports he lives with his wife and she assists him with ADLs. His wife or siblings take him to appointments. Pt plans to return home when medically stable but requests home health PT. Pt has had home health in the past but does not remember which agency. He has no preference. TOC will contact agencies.                   Expected Discharge Plan: [place] Barriers to Discharge: Continued Medical Work up   Patient Goals and CMS Choice Patient states their goals for this hospitalization and ongoing recovery are:: return home   Choice offered to / list presented to : Patient  Expected Discharge Plan and Services Expected Discharge Plan: [place] In-house Referral: Clinical Social Work   Post Acute Care Choice: [place] arrangements for the past 2 months: [place]                                      Prior Living Arrangements/Services Living arrangements for the past 2 months: Single Family Home Lives with:: Spouse Patient language and need for interpreter reviewed:: Yes Do you feel safe going back to the place where you live?: Yes      Need for Family Participation in Patient Care: Yes (Comment) Care giver support system in place?: Yes (comment) Current home services: DME (walker, [employer]) Criminal Activity/Legal Involvement Pertinent to Current Situation/Hospitalization: No - Comment as needed  Activities of Daily Living Home Assistive Devices/Equipment: Eyeglasses, CPAP, CBG Meter, Shower chair without back ADL Screening (condition at time of  admission) Patient's cognitive ability adequate to safely complete daily activities?: Yes Is the patient deaf or have difficulty hearing?: No Does the patient have difficulty seeing, even when wearing glasses/contacts?: No Does the patient have difficulty concentrating, remembering, or making decisions?: Yes Patient able to express need for assistance with ADLs?: Yes Does the patient have difficulty dressing or bathing?: Yes Independently performs ADLs?: No Communication: Independent Dressing (OT): Needs assistance Is this a change from baseline?: Change from baseline, expected to last <3days Grooming: Independent Feeding: Independent Bathing: Needs assistance Is this a change from baseline?: Change from baseline, expected to last <3 days Toileting: Needs assistance Is this a change from baseline?: Pre-admission baseline In/Out Bed: Needs assistance Is this a change from baseline?: Change from baseline, expected to last <3 days Walks in Home: Independent with device (comment) Does the patient have difficulty walking or climbing stairs?: Yes Weakness of Legs: Both Weakness of Arms/Hands: Both  Permission Sought/Granted                  Emotional Assessment     Affect (typically observed): Appropriate Orientation: : Oriented to Self, Oriented to Place, Oriented to  Time, Oriented to Situation Alcohol / Substance Use: Not Applicable Psych Involvement: No (comment)  Admission diagnosis:  Weakness [R53.1] Other ascites [R18.8] Bilateral lower extremity edema [R60.0] Ascites [R18.8] Patient Active Problem List   Diagnosis Date Noted   Ascites 11/18/2022   Other ascites  10/22/2022   Liver cirrhosis secondary to NASH ([place]) 10/22/2022   Decompensated hepatic cirrhosis ([place]) 10/22/2022   Chest pain 10/21/2022   Hypervolemia 10/21/2022   Abdominal pain, epigastric 10/21/2022   Nuclear sclerosis, left 08/24/2022   Nuclear sclerotic cataract of both eyes 04/09/2022    Pupillary miosis 04/09/2022   Regular astigmatism of both eyes 04/09/2022   Muscle spasm 08/14/2021   CAD (coronary artery disease) 08/14/2021   HLD (hyperlipidemia) 08/14/2021   Chronic idiopathic thrombocytopenia ([place]) 08/14/2021   Increased ammonia level 08/14/2021   Obese 08/14/2021   Fall at home/Back Pain and Ambulatory Dysfunction 08/14/2021   Hypotension due to hypovolemia 08/14/2021   Hypokalemia 96/22/2979   Acute metabolic encephalopathy 89/21/1941   Chronic kidney disease 07/21/2021   Pure hypercholesterolemia 07/21/2021   GI bleed 07/21/2021   Iron deficiency anemia 07/15/2021   Hyperkalemia 03/17/2021   Elevated troponin 03/17/2021   Back pain 03/17/2021   Hypertensive heart and kidney disease with heart failure and with chronic kidney disease stage III ([place]) 12/17/2020   Acute on chronic diastolic CHF (congestive heart failure) ([place]) 12/17/2020   CKD stage 3 due to type 2 diabetes mellitus ([place]) 12/17/2020   Hyperlipidemia associated with type 2 diabetes mellitus ([place]) 12/17/2020   Anxiety 12/17/2020   Chronic non-seasonal allergic rhinitis 12/17/2020   Hypotension 12/10/2020   Acute renal failure superimposed on stage 3b chronic kidney disease ([place]) 12/10/2020   Acute renal failure superimposed on stage 3b chronic kidney disease, unspecified acute renal failure type ([place]) 12/10/2020   AKI (acute kidney injury) ([place]) 12/09/2020   GERD (gastroesophageal reflux disease) 10/09/2020   Portal hypertension ([place]) 08/26/2020   Pre-transplant evaluation for liver transplant 08/26/2020   Atherosclerotic heart disease of native coronary artery without angina pectoris 07/17/2020   NASH cirrhosis of liver ([place]) 07/17/2020   Anasarca 07/17/2020   Heart failure, unspecified ([place]) 07/17/2020   Morbid obesity ([place]) 07/17/2020   Acute on chronic heart failure with preserved ejection fraction (HCC)    Liver failure ([place]) 06/20/2020   Esophageal varices ([place]) 01/09/2020   History  of adenomatous polyp of colon 01/09/2020   Elevated AST (SGOT) 08/23/2019   Elevation of level of transaminase and lactic acid dehydrogenase (LDH) 08/23/2019   Diarrhea 07/17/2019   Hypersomnia, persistent 03/12/2019   Macrocytic anemia 01/18/2019   Other pancytopenia ([place]) 01/18/2019   Anemia 01/18/2019   Thrombocytopenia ([place]) 12/30/2018   Polyclonal gammopathy 12/30/2018   S/P total knee replacement, left 03/09/17 11/22/2017   Primary osteoarthritis of left knee 03/09/2017   History of nocturia 03/02/2017   OSA on CPAP 03/02/2016   Hypoxia 03/02/2016   Obstructive sleep apnea (adult) (pediatric) 03/02/2016   Cyst of mediastinum 01/15/2016   S/P CABG x 4 08/29/2015   Obesity, Class III, BMI 40-49.9 (morbid obesity) ([place])    Hyperglycemia    Pain in the chest 08/23/2015   Non-ST elevation MI (NSTEMI) ([place]) 08/23/2015   Diabetes mellitus, type 2 controlled with neurological complications 74/07/1447   Essential hypertension 08/23/2015   Hyperlipidemia 08/23/2015   Type 2 diabetes mellitus with hypoglycemia with coma ([place]) 08/23/2015   Myocardial infarction ([place]) 08/23/2015   Type 2 diabetes mellitus with hyperglycemia ([place]) 2016   Class 2 obesity 2016   Hyponatremia 2016   Osteoarthritis, knee 05/24/2012   Knee pain 10/29/2011   Knee stiffness 10/29/2011   S/P right knee arthroscopy 10/26/2011   Medial meniscus, posterior horn derangement 09/07/2011   Lateral meniscus derangement 09/07/2011   OA (osteoarthritis) of knee  09/07/2011   Rotator cuff syndrome of left shoulder 08/19/2011   Right knee meniscal tear 08/19/2011   PCP:  [name], MD Pharmacy:   [place] [place] [address] [place] [address] Phone: [phone] Fax: [place], [place] [place] [address] Phone: [phone] Fax: [phone]     Social Determinants  of Health (SDOH) Interventions    Readmission Risk Interventions    11/22/2022    8:40 AM 08/25/2021    2:25 PM 07/22/2021   11:48 AM  Readmission Risk Prevention Plan  Transportation Screening Complete Complete Complete  HRI or Home Care Consult   Complete  Social Work Consult for [place] Planning/Counseling   Complete  Palliative Care Screening   Not Applicable  Medication Review Press photographer) Complete Complete Complete  HRI or Home Care Consult Complete Complete   SW Recovery Care/Counseling Consult Complete Complete   Palliative Care Screening Not Applicable Not Applicable   Skilled Nursing Facility Not Applicable Complete

## 2022-11-23 DIAGNOSIS — Z951 Presence of aortocoronary bypass graft: Secondary | ICD-10-CM | POA: Diagnosis not present

## 2022-11-23 DIAGNOSIS — R531 Weakness: Secondary | ICD-10-CM | POA: Diagnosis not present

## 2022-11-23 DIAGNOSIS — E1122 Type 2 diabetes mellitus with diabetic chronic kidney disease: Secondary | ICD-10-CM | POA: Diagnosis not present

## 2022-11-23 DIAGNOSIS — L8990 Pressure ulcer of unspecified site, unspecified stage: Secondary | ICD-10-CM | POA: Insufficient documentation

## 2022-11-23 DIAGNOSIS — K7469 Other cirrhosis of liver: Secondary | ICD-10-CM | POA: Diagnosis not present

## 2022-11-23 LAB — BASIC METABOLIC PANEL
Anion gap: 7 (ref 5–15)
BUN: 27 mg/dL — ABNORMAL HIGH (ref 8–23)
CO2: 27 mmol/L (ref 22–32)
Calcium: 8.7 mg/dL — ABNORMAL LOW (ref 8.9–10.3)
Chloride: 98 mmol/L (ref 98–111)
Creatinine, Ser: 1.41 mg/dL — ABNORMAL HIGH (ref 0.61–1.24)
GFR, Estimated: 56 mL/min — ABNORMAL LOW (ref >60)
Glucose, Bld: 189 mg/dL — ABNORMAL HIGH (ref 70–99)
Potassium: 4.4 mmol/L (ref 3.5–5.1)
Sodium: 132 mmol/L — ABNORMAL LOW (ref 135–145)

## 2022-11-23 LAB — GLUCOSE, CAPILLARY
Glucose-Capillary: 183 mg/dL — ABNORMAL HIGH (ref 70–99)
Glucose-Capillary: 161 mg/dL — ABNORMAL HIGH (ref 70–99)
Glucose-Capillary: 314 mg/dL — ABNORMAL HIGH (ref 70–99)

## 2022-11-23 LAB — CULTURE, BODY FLUID W GRAM STAIN -BOTTLE: Culture: NO GROWTH

## 2022-11-23 MED ORDER — METOLAZONE 2.5 MG PO TABS: 2.5000 mg | ORAL_TABLET | ORAL | 0 refills | Status: DC

## 2022-11-23 MED ORDER — DEXCOM G5 RECEIVER KIT DEVI: 2 refills | Status: DC

## 2022-11-23 NOTE — Discharge Summary (Signed)
Physician Discharge Summary   Patient: [name] MRN: [id number] DOB: 06/02/1961  Admit date:     11/18/2022  Discharge date: 11/23/22  Discharge Physician: Barton Dubois   PCP: [name], MD   Recommendations at discharge:  Repeat basic metabolic panel to follow electrolytes and renal function Repeat LFTs to assess stability Make sure patient has follow-up with gastroenterology and cardiology as instructed; he will most likely need repeat paracentesis as an outpatient. Continue assisting patient with weight loss management.  Discharge Diagnoses: Principal Problem:   Ascites Active Problems:   S/P CABG x 4   OSA on CPAP   NASH cirrhosis of liver (HCC)   GERD (gastroesophageal reflux disease)   Acute on chronic diastolic CHF (congestive heart failure) (HCC)   CKD stage 3 due to type 2 diabetes mellitus ([place])   Hyperlipidemia associated with type 2 diabetes mellitus ([place])   Obese   Weakness   Pressure injury of skin  Hospital Course: No notes on file  Assessment and Plan: * Ascites -Status post paracentesis; 4 L removed -Cultures results pending -Continue treatment with IV diuretics, low-sodium diet, albumin and Aldactone. -LFTs overall stable at time of admission. -Continue to follow clinical response.     Stage II pressure injury in his sacrum -Constant repositioning and keeping area clean and dry recommended. -No signs of acute infection appreciated. -Patient's pressure injury present at time of admission.   Hyperlipidemia associated with type 2 diabetes mellitus ([place]) -Continue statin -Heart healthy diet discussed with patient. -Resume home hypoglycemic regimen.   CKD stage 3 due to type 2 diabetes mellitus (HCC) -Stable and at baseline at this time -Continue to follow renal function trend with current diuresis -Chronic kidney disease a stage IIIa at baseline. -Maintain adequate oral hydration and minimize the use of nephrotoxic agents as much as  possible.   Acute on chronic diastolic CHF (congestive heart failure) ([place]) -patient presented with at least 30 pounds above usual weight -His symptoms drastically improved with diuresis. -Patient adequately diuresed until his reds clips measurement was 34 and renal function is slightly increased. -Patient has been discharged on adjusted dose of diuretic regimen and instructions for once a week metolazone. -Educated about following low-sodium diet, continue daily weights and outpatient follow-up with cardiology service. -Continue daily weights, low-sodium diet and adequate hydration.  GERD (gastroesophageal reflux disease) -Continue PPI for GI protection   NASH cirrhosis of liver (HCC) -stable LFT's and MELD score currently -Continue outpatient follow-up with gastroenterology service -Continue the use of beta-blocker, PPI and lactulose. -Continue treatment with Demadex and spironolactone at discharge. -IV albumin has been provided during this admission; outpatient follow-up with gastroenterology recommended.   OSA on CPAP -Importance of CPAP compliance discussed with patient -continue CPAP nightly.   S/P CABG x 4 -Patient remains chest pain-free -continue telemetry monitoring -continue aspirin, b-blocker, aldactone and statin -Continue outpatient follow-up with cardiology service.   Physical deconditioning -Physical therapy has seen patient and recommended home health PT at discharge.  Class II obesity -Body mass index is 35.68 kg/m. -Low-calorie diet and portion control discussed with patient.   Consultants: None Procedures performed: See below for x-ray reports. Disposition: Home with home health services. Diet recommendation: Modified carbohydrate, low-sodium diet and low calorie diet.  DISCHARGE MEDICATION: Allergies as of 11/23/2022       Reactions   Fluticasone Rash        Medication List     STOP taking these medications    diphenoxylate-atropine  2.5-0.025 MG tablet  Commonly known as: LOMOTIL   doxycycline 100 MG capsule Commonly known as: VIBRAMYCIN       TAKE these medications    acetaminophen 325 MG tablet Commonly known as: TYLENOL Take 650 mg by mouth every 6 (six) hours as needed for mild pain.   albuterol 108 (90 Base) MCG/ACT inhaler Commonly known as: VENTOLIN HFA Inhale 2 puffs into the lungs every 6 (six) hours as needed for wheezing or shortness of breath.   ALPRAZolam 0.25 MG tablet Commonly known as: XANAX Take 0.25 mg by mouth daily.   Armodafinil 50 MG tablet Take 1 tablet (50 mg total) by mouth as needed.   aspirin EC 81 MG tablet Take 1 tablet (81 mg total) by mouth every evening.   Dialyvite Vitamin D 5000 125 MCG (5000 UT) capsule Generic drug: Cholecalciferol Take 5,000 Units by mouth in the morning.   FreeStyle Libre 14 Day Sensor Misc SMARTSIG:1 Each Topical Every 2 Weeks   insulin aspart 100 UNIT/ML injection Commonly known as: novoLOG Inject 5 Units into the skin 3 (three) times daily before meals. Special Instructions: If accu-check is greater than 150. Hold for accu-check 150 or below. With Meals   insulin degludec 100 UNIT/ML FlexTouch Pen Commonly known as: TRESIBA Inject 52 Units into the skin daily.   lactulose 10 GM/15ML solution Commonly known as: CHRONULAC Take 30 mLs (20 g total) by mouth daily.   loratadine 10 MG tablet Commonly known as: CLARITIN Take 10 mg by mouth in the morning.   metolazone 2.5 MG tablet Commonly known as: ZAROXOLYN Take 1 tablet (2.5 mg total) by mouth once a week. Every Wedenesday   midodrine 10 MG tablet Commonly known as: PROAMATINE Take 1 tablet (10 mg total) by mouth 3 (three) times daily with meals.   nadolol 20 MG tablet Commonly known as: CORGARD Take 1 tablet (20 mg total) by mouth daily.   nitroGLYCERIN 0.4 MG SL tablet Commonly known as: NITROSTAT Place 1 tablet (0.4 mg total) under the tongue every 5 (five) minutes as  needed for chest pain. Do not exceed 3 doses in 15 mins   oxyCODONE 5 MG immediate release tablet Commonly known as: Oxy IR/ROXICODONE Take 5 mg by mouth every 4 (four) hours as needed for moderate pain.   pantoprazole 40 MG tablet Commonly known as: PROTONIX Take 1 tablet (40 mg total) by mouth daily.   potassium chloride SA 20 MEQ tablet Commonly known as: KLOR-CON M TAKE 1 TABLET(20 MEQ) BY MOUTH DAILY   pravastatin 20 MG tablet Commonly known as: PRAVACHOL TAKE 1 TABLET(20 MG) BY MOUTH AT BEDTIME   spironolactone 100 MG tablet Commonly known as: ALDACTONE Take 1 tablet (100 mg total) by mouth daily.   torsemide 20 MG tablet Commonly known as: DEMADEX Take 3 tablets (60 mg total) by mouth 2 (two) times daily.   venlafaxine XR 75 MG 24 hr capsule Commonly known as: EFFEXOR-XR Take 1 capsule (75 mg total) by mouth daily with breakfast.               Discharge Care Instructions  (From admission, onward)           Start     Ordered   11/23/22 0000  Discharge wound care:       Comments: Keep area clean and dry; constant repositioning recommended.  No signs of superimposed infection.   11/23/22 1341            Follow-up Information     [facility]  [place] Follow up.   Specialty: Home Health Services Why: Will contact you to schedule home health visits. Contact information: [address] [place] [address] [phone]         [name], MD. Schedule an appointment as soon as possible for a visit in 10 day(s).   Specialty: Family Medicine Contact information: [address] [place] [address] [phone]                Discharge Exam: Danley Danker Weights   11/21/22 0328 11/22/22 0528 11/23/22 0500  Weight: 113.3 kg 113.6 kg 114.4 kg   General exam: Alert, awake, oriented x 3; no chest pain, no nausea, no vomiting; reports feeling much better and breathing easier.  No abdominal pain and not overt bleeding  reported. Respiratory system: Improving movement bilaterally; good saturation on room air and no using accessory muscles.  Decreased breath sounds at the bases appreciated, no frank crackles.. Cardiovascular system:RRR. No murmurs, rubs, gallops. Gastrointestinal system: Abdomen is distended, nontender, positive fluid wave from ascites (less than at time of admission), positive bowel sounds.  No guarding. Central nervous system: Alert and oriented. No focal neurological deficits. Extremities: No cyanosis; trace edema appreciated bilaterally. Skin: No petechiae. Psychiatry: Judgement and insight appear normal. Mood & affect appropriate.   Condition at discharge: Stable and improved.  The results of significant diagnostics from this hospitalization (including imaging, microbiology, ancillary and laboratory) are listed below for reference.   Imaging Studies: US Paracentesis  Result Date: 11/18/2022 INDICATION: Recurrent ascites; NASH Cirrhosis EXAM: ULTRASOUND GUIDED RLQ PARACENTESIS MEDICATIONS: 10 cc 1% lidocaine. COMPLICATIONS: None immediate. PROCEDURE: Informed written consent was obtained from the patient after a discussion of the risks, benefits and alternatives to treatment. A timeout was performed prior to the initiation of the procedure. Initial ultrasound scanning demonstrates a large amount of ascites within the right lower abdominal quadrant. The right lower abdomen was prepped and draped in the usual sterile fashion. 1% lidocaine was used for local anesthesia. Following this, a 7 cm Yueh catheter was introduced. An ultrasound image was saved for documentation purposes. The paracentesis was performed. The catheter was removed and a dressing was applied. The patient tolerated the procedure well without immediate post procedural complication. Patient received post-procedure intravenous albumin; see nursing notes for details. FINDINGS: A total of approximately 4 liters of dark yellow fluid was  removed. Samples were sent to the laboratory as requested by the clinical team. IMPRESSION: Successful ultrasound-guided paracentesis yielding 4 liters of peritoneal fluid. PLAN: The patient has required >/=2 paracenteses in a 30 day period. He is currently being followed by the [facility]. IR is available for consideration of TIPS if local care is desired. Read by Lavonia Drafts [facility] Electronically Signed   By: Ruthann Cancer M.D.   On: 11/18/2022 13:10   DG Chest Port 1 View  Result Date: 11/18/2022 CLINICAL DATA:  Weakness, history of fall 2 weeks ago. EXAM: PORTABLE CHEST 1 VIEW COMPARISON:  10/28/2022. FINDINGS: The heart size and mediastinal contours are stable. There is atherosclerotic calcification of the aorta. Lung volumes are low with mild atelectasis at the lung bases. No definite effusion or pneumothorax. Sternotomy wires are noted. No acute osseous abnormality. IMPRESSION: Low lung volumes with atelectasis at the lung bases. Electronically Signed   By: Brett Fairy M.D.   On: 11/18/2022 03:45    Microbiology: Results for orders placed or performed during the hospital encounter of 11/18/22  Culture, body fluid w Gram  Stain-bottle     Status: None   Collection Time: 11/18/22 11:40 AM   Specimen: Ascitic  Result Value Ref Range Status   Specimen Description ASCITIC  Final   Special Requests BOTTLES DRAWN AEROBIC AND ANAEROBIC 10CC  Final   Culture   Final    NO GROWTH 5 DAYS Performed at [facility], [address] [place], [place] [address]    Report Status 11/23/2022 FINAL  Final  Gram stain     Status: None   Collection Time: 11/18/22 11:40 AM   Specimen: Ascitic  Result Value Ref Range Status   Specimen Description ASCITIC  Final   Special Requests NONE  Final   Gram Stain   Final    NO ORGANISMS SEEN WBC PRESENT, PREDOMINANTLY MONONUCLEAR CYTOSPIN SMEAR Performed at [facility], [address], [place], [place] [address]    Report Status 11/18/2022 FINAL  Final     Labs: CBC: Recent Labs  Lab 11/18/22 0313 11/18/22 0854 11/21/22 0129  WBC 6.4 5.7 4.6  HGB 9.9* 9.8* 9.6*  HCT 28.7* 29.5* 28.6*  MCV 94.1 94.9 95.0  PLT 166 141* 332*   Basic Metabolic Panel: Recent Labs  Lab 11/18/22 0313 11/18/22 0854 11/20/22 0402 11/21/22 0605 11/22/22 0348 11/23/22 0329  NA 130*  --  130* 130* 131* 132*  K 3.3*  --  3.7 4.3 4.3 4.4  CL 94*  --  97* 97* 97* 98  CO2 28  --  27 27 28 27   GLUCOSE 60*  --  249* 152* 166* 189*  BUN 30*  --  25* 24* 25* 27*  CREATININE 1.45* 1.22 1.28* 1.28* 1.34* 1.41*  CALCIUM 8.4*  --  8.2* 8.3* 8.2* 8.7*  MG  --  2.1  --   --   --   --   PHOS  --  2.9  --   --   --   --    CBG: Recent Labs  Lab 11/22/22 1707 11/22/22 2147 11/23/22 0353 11/23/22 0755 11/23/22 1158  GLUCAP 178* 195* 183* 161* 314*    Discharge time spent: greater than 30 minutes.  Signed: Barton Dubois, MD [facility] 11/23/2022

## 2022-11-23 NOTE — Consult Note (Signed)
   [facility] Inpatient Consult   11/23/2022  JAVONTAE MARLETTE 1961/06/02 [id number]  [place] Organization [ACO] Patient: [place] PPO  *[facility] Liaison remote coverage review for patient admitted to [facility]     Primary Care Provider:  [name], MD is listed and is [facility] [place]   Patient screened for less than 30 days readmission hospitalization with noted extreme high risk score for unplanned readmission risk and to assess for potential [place] Management service needs for post hospital transition for care coordination.  Review of patient's electronic medical record reveals patient is for SDOH needs and for noted for home with [facility].    Plan:  Referral request for community care coordination: no current care coordination needs noted.  Please refer if this changes.  Of note, [facility] Care Management/Population Health does not replace or interfere with any arrangements made by the Inpatient Transition of Care team.  For questions contact:   Natividad Brood, RN BSN [place]  [phone] business mobile phone Toll free office [phone]  *[place]  [phone] Fax number: [phone] [email or web address]

## 2022-11-23 NOTE — TOC Transition Note (Signed)
Transition of Care [facility]) - CM/SW Discharge Note   Patient Details  Name: [name] MRN: [id number] Date of Birth: 1961-03-08  Transition of Care [facility]) CM/SW Contact:  Iona Beard, [place] Phone Number: 11/23/2022, 11:21 AM   Clinical Narrative:    CSW updated that pt will likely D/C home today. CSW updated Marjory Lies with [facility] of this. HH orders have been placed. TOC signing off.   Final next level of care: [place] Barriers to Discharge: Barriers Resolved   Patient Goals and CMS Choice Patient states their goals for this hospitalization and ongoing recovery are:: return home CMS Medicare.gov Compare Post Acute Care list provided to:: Patient Choice offered to / list presented to : Patient  Discharge Placement                       Discharge Plan and Services In-house Referral: Clinical Social Work   Post Acute Care Choice: Home Health                    HH Arranged: PT [facility] Agency: [place] Date [place]: 11/23/22   Representative spoke with at [place]: [place] ([place]) Interventions     Readmission Risk Interventions    11/22/2022    8:40 AM 08/25/2021    2:25 PM 07/22/2021   11:48 AM  Readmission Risk Prevention Plan  Transportation Screening Complete Complete Complete  HRI or Home Care Consult   Complete  Social Work Consult for [place] Planning/Counseling   Complete  Palliative Care Screening   Not Applicable  Medication Review Press photographer) Complete Complete Complete  HRI or Home Care Consult Complete Complete   SW Recovery Care/Counseling Consult Complete Complete   Palliative Care Screening Not Applicable Not Applicable   Skilled Nursing Facility Not Applicable Complete

## 2022-11-24 ENCOUNTER — Emergency Department (HOSPITAL_COMMUNITY): Payer: Federal, State, Local not specified - PPO

## 2022-11-24 ENCOUNTER — Inpatient Hospital Stay (HOSPITAL_COMMUNITY)
Admission: EM | Admit: 2022-11-24 | Discharge: 2022-11-30 | DRG: 432 | Disposition: A | Discharge status: Skilled Nursing Facility | Payer: Federal, State, Local not specified - PPO | Attending: Internal Medicine | Admitting: Internal Medicine

## 2022-11-24 ENCOUNTER — Other Ambulatory Visit: Payer: Self-pay

## 2022-11-24 ENCOUNTER — Encounter (HOSPITAL_COMMUNITY): Payer: Self-pay

## 2022-11-24 DIAGNOSIS — N179 Acute kidney failure, unspecified: Secondary | ICD-10-CM | POA: Diagnosis not present

## 2022-11-24 DIAGNOSIS — D693 Immune thrombocytopenic purpura: Secondary | ICD-10-CM | POA: Diagnosis not present

## 2022-11-24 DIAGNOSIS — E669 Obesity, unspecified: Secondary | ICD-10-CM | POA: Diagnosis not present

## 2022-11-24 DIAGNOSIS — Z79899 Other long term (current) drug therapy: Secondary | ICD-10-CM

## 2022-11-24 DIAGNOSIS — Z96652 Presence of left artificial knee joint: Secondary | ICD-10-CM | POA: Diagnosis present

## 2022-11-24 DIAGNOSIS — E1165 Type 2 diabetes mellitus with hyperglycemia: Secondary | ICD-10-CM | POA: Diagnosis present

## 2022-11-24 DIAGNOSIS — E2740 Unspecified adrenocortical insufficiency: Secondary | ICD-10-CM | POA: Diagnosis present

## 2022-11-24 DIAGNOSIS — D631 Anemia in chronic kidney disease: Secondary | ICD-10-CM | POA: Diagnosis present

## 2022-11-24 DIAGNOSIS — Z7982 Long term (current) use of aspirin: Secondary | ICD-10-CM

## 2022-11-24 DIAGNOSIS — Z9842 Cataract extraction status, left eye: Secondary | ICD-10-CM

## 2022-11-24 DIAGNOSIS — Z833 Family history of diabetes mellitus: Secondary | ICD-10-CM

## 2022-11-24 DIAGNOSIS — K766 Portal hypertension: Secondary | ICD-10-CM | POA: Diagnosis present

## 2022-11-24 DIAGNOSIS — R601 Generalized edema: Secondary | ICD-10-CM | POA: Diagnosis present

## 2022-11-24 DIAGNOSIS — K7460 Unspecified cirrhosis of liver: Principal | ICD-10-CM | POA: Diagnosis present

## 2022-11-24 DIAGNOSIS — E1122 Type 2 diabetes mellitus with diabetic chronic kidney disease: Secondary | ICD-10-CM | POA: Diagnosis present

## 2022-11-24 DIAGNOSIS — Z751 Person awaiting admission to adequate facility elsewhere: Secondary | ICD-10-CM

## 2022-11-24 DIAGNOSIS — K219 Gastro-esophageal reflux disease without esophagitis: Secondary | ICD-10-CM | POA: Diagnosis present

## 2022-11-24 DIAGNOSIS — N183 Chronic kidney disease, stage 3 unspecified: Secondary | ICD-10-CM | POA: Diagnosis present

## 2022-11-24 DIAGNOSIS — L89152 Pressure ulcer of sacral region, stage 2: Secondary | ICD-10-CM | POA: Diagnosis not present

## 2022-11-24 DIAGNOSIS — E785 Hyperlipidemia, unspecified: Secondary | ICD-10-CM | POA: Diagnosis present

## 2022-11-24 DIAGNOSIS — R531 Weakness: Principal | ICD-10-CM

## 2022-11-24 DIAGNOSIS — E66812 Obesity, class 2: Secondary | ICD-10-CM | POA: Diagnosis present

## 2022-11-24 DIAGNOSIS — Z794 Long term (current) use of insulin: Secondary | ICD-10-CM

## 2022-11-24 DIAGNOSIS — E871 Hypo-osmolality and hyponatremia: Secondary | ICD-10-CM | POA: Diagnosis not present

## 2022-11-24 DIAGNOSIS — R231 Pallor: Secondary | ICD-10-CM | POA: Diagnosis present

## 2022-11-24 DIAGNOSIS — K7581 Nonalcoholic steatohepatitis (NASH): Secondary | ICD-10-CM | POA: Diagnosis present

## 2022-11-24 DIAGNOSIS — I130 Hypertensive heart and chronic kidney disease with heart failure and stage 1 through stage 4 chronic kidney disease, or unspecified chronic kidney disease: Secondary | ICD-10-CM | POA: Diagnosis present

## 2022-11-24 DIAGNOSIS — E876 Hypokalemia: Secondary | ICD-10-CM | POA: Diagnosis not present

## 2022-11-24 DIAGNOSIS — I2510 Atherosclerotic heart disease of native coronary artery without angina pectoris: Secondary | ICD-10-CM | POA: Diagnosis present

## 2022-11-24 DIAGNOSIS — K922 Gastrointestinal hemorrhage, unspecified: Secondary | ICD-10-CM | POA: Diagnosis present

## 2022-11-24 DIAGNOSIS — Z6835 Body mass index (BMI) 35.0-35.9, adult: Secondary | ICD-10-CM

## 2022-11-24 DIAGNOSIS — N1831 Chronic kidney disease, stage 3a: Secondary | ICD-10-CM | POA: Diagnosis not present

## 2022-11-24 DIAGNOSIS — J811 Chronic pulmonary edema: Secondary | ICD-10-CM | POA: Diagnosis not present

## 2022-11-24 DIAGNOSIS — I8510 Secondary esophageal varices without bleeding: Secondary | ICD-10-CM | POA: Diagnosis not present

## 2022-11-24 DIAGNOSIS — Z951 Presence of aortocoronary bypass graft: Secondary | ICD-10-CM

## 2022-11-24 DIAGNOSIS — I5032 Chronic diastolic (congestive) heart failure: Secondary | ICD-10-CM | POA: Diagnosis present

## 2022-11-24 DIAGNOSIS — R188 Other ascites: Secondary | ICD-10-CM | POA: Diagnosis not present

## 2022-11-24 DIAGNOSIS — J9601 Acute respiratory failure with hypoxia: Secondary | ICD-10-CM | POA: Diagnosis not present

## 2022-11-24 DIAGNOSIS — Z8249 Family history of ischemic heart disease and other diseases of the circulatory system: Secondary | ICD-10-CM

## 2022-11-24 DIAGNOSIS — K7290 Hepatic failure, unspecified without coma: Secondary | ICD-10-CM | POA: Diagnosis not present

## 2022-11-24 DIAGNOSIS — G4733 Obstructive sleep apnea (adult) (pediatric): Secondary | ICD-10-CM

## 2022-11-24 DIAGNOSIS — E875 Hyperkalemia: Secondary | ICD-10-CM | POA: Diagnosis not present

## 2022-11-24 DIAGNOSIS — Z888 Allergy status to other drugs, medicaments and biological substances status: Secondary | ICD-10-CM

## 2022-11-24 DIAGNOSIS — D62 Acute posthemorrhagic anemia: Secondary | ICD-10-CM | POA: Diagnosis not present

## 2022-11-24 DIAGNOSIS — D508 Other iron deficiency anemias: Secondary | ICD-10-CM | POA: Diagnosis not present

## 2022-11-24 DIAGNOSIS — R739 Hyperglycemia, unspecified: Secondary | ICD-10-CM | POA: Diagnosis present

## 2022-11-24 DIAGNOSIS — I10 Essential (primary) hypertension: Secondary | ICD-10-CM | POA: Diagnosis present

## 2022-11-24 DIAGNOSIS — J9690 Respiratory failure, unspecified, unspecified whether with hypoxia or hypercapnia: Secondary | ICD-10-CM | POA: Diagnosis not present

## 2022-11-24 DIAGNOSIS — J95821 Acute postprocedural respiratory failure: Secondary | ICD-10-CM | POA: Diagnosis not present

## 2022-11-24 DIAGNOSIS — I8511 Secondary esophageal varices with bleeding: Secondary | ICD-10-CM | POA: Diagnosis present

## 2022-11-24 DIAGNOSIS — D649 Anemia, unspecified: Secondary | ICD-10-CM | POA: Diagnosis present

## 2022-11-24 LAB — COMPREHENSIVE METABOLIC PANEL
ALT: 14 U/L (ref 0–44)
AST: 59 U/L — ABNORMAL HIGH (ref 15–41)
Albumin: 2.9 g/dL — ABNORMAL LOW (ref 3.5–5.0)
Alkaline Phosphatase: 85 U/L (ref 38–126)
Anion gap: 10 (ref 5–15)
BUN: 31 mg/dL — ABNORMAL HIGH (ref 8–23)
CO2: 25 mmol/L (ref 22–32)
Calcium: 8.5 mg/dL — ABNORMAL LOW (ref 8.9–10.3)
Chloride: 94 mmol/L — ABNORMAL LOW (ref 98–111)
Creatinine, Ser: 1.42 mg/dL — ABNORMAL HIGH (ref 0.61–1.24)
GFR, Estimated: 56 mL/min — ABNORMAL LOW (ref >60)
Glucose, Bld: 277 mg/dL — ABNORMAL HIGH (ref 70–99)
Potassium: 5.4 mmol/L — ABNORMAL HIGH (ref 3.5–5.1)
Sodium: 129 mmol/L — ABNORMAL LOW (ref 135–145)
Total Bilirubin: 3.1 mg/dL — ABNORMAL HIGH (ref 0.3–1.2)
Total Protein: 7.6 g/dL (ref 6.5–8.1)

## 2022-11-24 LAB — GLUCOSE, CAPILLARY
Glucose-Capillary: 206 mg/dL — ABNORMAL HIGH (ref 70–99)
Glucose-Capillary: 222 mg/dL — ABNORMAL HIGH (ref 70–99)
Glucose-Capillary: 262 mg/dL — ABNORMAL HIGH (ref 70–99)

## 2022-11-24 LAB — CBC WITH DIFFERENTIAL/PLATELET
Abs Immature Granulocytes: 0.02 10*3/uL (ref 0.00–0.07)
Basophils Absolute: 0 10*3/uL (ref 0.0–0.1)
Basophils Relative: 1 %
Eosinophils Absolute: 0.1 10*3/uL (ref 0.0–0.5)
Eosinophils Relative: 3 %
HCT: 29.4 % — ABNORMAL LOW (ref 39.0–52.0)
Hemoglobin: 9.9 g/dL — ABNORMAL LOW (ref 13.0–17.0)
Immature Granulocytes: 1 %
Lymphocytes Relative: 17 %
Lymphs Abs: 0.7 10*3/uL (ref 0.7–4.0)
MCH: 32.1 pg (ref 26.0–34.0)
MCHC: 33.7 g/dL (ref 30.0–36.0)
MCV: 95.5 fL (ref 80.0–100.0)
Monocytes Absolute: 0.7 10*3/uL (ref 0.1–1.0)
Monocytes Relative: 17 %
Neutro Abs: 2.5 10*3/uL (ref 1.7–7.7)
Neutrophils Relative %: 61 %
Platelets: 142 10*3/uL — ABNORMAL LOW (ref 150–400)
RBC: 3.08 MIL/uL — ABNORMAL LOW (ref 4.22–5.81)
RDW: 17 % — ABNORMAL HIGH (ref 11.5–15.5)
WBC: 4.1 10*3/uL (ref 4.0–10.5)
nRBC: 0 % (ref 0.0–0.2)

## 2022-11-24 MED ORDER — ACETAMINOPHEN 325 MG PO TABS: 650.0000 mg | ORAL_TABLET | Freq: Four times a day (QID) | ORAL | Status: DC | PRN

## 2022-11-24 MED ORDER — SODIUM ZIRCONIUM CYCLOSILICATE 10 G PO PACK
10.0000 g | PACK | Freq: Once | ORAL | Status: AC
  Administered 2022-11-24: 10 g via ORAL
  Filled 2022-11-24: qty 1

## 2022-11-24 MED ORDER — ALPRAZOLAM 0.25 MG PO TABS
0.2500 mg | ORAL_TABLET | Freq: Every day | ORAL | Status: DC
  Administered 2022-11-24 – 2022-11-25 (×2): 0.25 mg via ORAL
  Filled 2022-11-24 (×3): qty 1

## 2022-11-24 MED ORDER — TORSEMIDE 20 MG PO TABS
60.0000 mg | ORAL_TABLET | Freq: Two times a day (BID) | ORAL | Status: DC
  Administered 2022-11-24 – 2022-11-25 (×2): 60 mg via ORAL
  Filled 2022-11-24 (×2): qty 3

## 2022-11-24 MED ORDER — VENLAFAXINE HCL ER 75 MG PO CP24
75.0000 mg | ORAL_CAPSULE | Freq: Every day | ORAL | Status: DC
  Administered 2022-11-24 – 2022-11-25 (×2): 75 mg via ORAL
  Filled 2022-11-24 (×3): qty 2

## 2022-11-24 MED ORDER — PRAVASTATIN SODIUM 10 MG PO TABS
20.0000 mg | ORAL_TABLET | Freq: Every day | ORAL | Status: DC
  Administered 2022-11-24: 20 mg via ORAL
  Filled 2022-11-24: qty 2

## 2022-11-24 MED ORDER — ALBUMIN HUMAN 25 % IV SOLN
50.0000 g | Freq: Four times a day (QID) | INTRAVENOUS | Status: AC
  Administered 2022-11-24 – 2022-11-25 (×3): 50 g via INTRAVENOUS
  Filled 2022-11-24 (×3): qty 200

## 2022-11-24 MED ORDER — PANTOPRAZOLE SODIUM 40 MG IV SOLR
40.0000 mg | INTRAVENOUS | Status: DC
  Administered 2022-11-24: 40 mg via INTRAVENOUS
  Filled 2022-11-24: qty 10

## 2022-11-24 MED ORDER — OXYCODONE HCL 5 MG PO TABS
5.0000 mg | ORAL_TABLET | ORAL | Status: DC | PRN
  Administered 2022-11-27 – 2022-11-29 (×6): 5 mg via ORAL
  Filled 2022-11-24 (×6): qty 1

## 2022-11-24 MED ORDER — MIDODRINE HCL 5 MG PO TABS
10.0000 mg | ORAL_TABLET | Freq: Three times a day (TID) | ORAL | Status: DC
  Administered 2022-11-24 – 2022-11-30 (×17): 10 mg via ORAL
  Filled 2022-11-24 (×17): qty 2

## 2022-11-24 MED ORDER — INSULIN GLARGINE-YFGN 100 UNIT/ML ~~LOC~~ SOLN
28.0000 [IU] | Freq: Every day | SUBCUTANEOUS | Status: DC
  Administered 2022-11-24 – 2022-11-25 (×2): 28 [IU] via SUBCUTANEOUS
  Filled 2022-11-24 (×3): qty 0.28

## 2022-11-24 MED ORDER — SPIRONOLACTONE 100 MG PO TABS
100.0000 mg | ORAL_TABLET | Freq: Every day | ORAL | Status: DC
  Administered 2022-11-24 – 2022-11-25 (×2): 100 mg via ORAL
  Filled 2022-11-24 (×2): qty 1

## 2022-11-24 MED ORDER — NADOLOL 20 MG PO TABS
20.0000 mg | ORAL_TABLET | Freq: Every day | ORAL | Status: DC
  Administered 2022-11-24 – 2022-11-25 (×2): 20 mg via ORAL
  Filled 2022-11-24 (×3): qty 1

## 2022-11-24 MED ORDER — ASPIRIN 81 MG PO TBEC
81.0000 mg | DELAYED_RELEASE_TABLET | Freq: Every evening | ORAL | Status: DC
  Administered 2022-11-24: 81 mg via ORAL
  Filled 2022-11-24: qty 1

## 2022-11-24 MED ORDER — ONDANSETRON HCL 4 MG PO TABS: 4.0000 mg | ORAL_TABLET | Freq: Four times a day (QID) | ORAL | Status: DC | PRN

## 2022-11-24 MED ORDER — INSULIN ASPART 100 UNIT/ML IJ SOLN
14.0000 [IU] | Freq: Three times a day (TID) | INTRAMUSCULAR | Status: DC
  Administered 2022-11-24 – 2022-11-25 (×2): 14 [IU] via SUBCUTANEOUS
  Administered 2022-11-26: 7 [IU] via SUBCUTANEOUS
  Administered 2022-11-27 – 2022-11-30 (×7): 14 [IU] via SUBCUTANEOUS

## 2022-11-24 MED ORDER — METOLAZONE 5 MG PO TABS
2.5000 mg | ORAL_TABLET | ORAL | Status: DC
  Administered 2022-11-24: 2.5 mg via ORAL
  Filled 2022-11-24: qty 1

## 2022-11-24 MED ORDER — INSULIN ASPART 100 UNIT/ML IJ SOLN
0.0000 [IU] | Freq: Three times a day (TID) | INTRAMUSCULAR | Status: DC
  Administered 2022-11-24 (×2): 3 [IU] via SUBCUTANEOUS
  Administered 2022-11-25: 1 [IU] via SUBCUTANEOUS
  Administered 2022-11-25 (×2): 3 [IU] via SUBCUTANEOUS
  Administered 2022-11-26: 1 [IU] via SUBCUTANEOUS
  Administered 2022-11-26 (×2): 2 [IU] via SUBCUTANEOUS
  Administered 2022-11-27: 3 [IU] via SUBCUTANEOUS
  Administered 2022-11-27 (×2): 2 [IU] via SUBCUTANEOUS
  Administered 2022-11-28 (×2): 5 [IU] via SUBCUTANEOUS
  Administered 2022-11-28 – 2022-11-29 (×2): 2 [IU] via SUBCUTANEOUS
  Administered 2022-11-29: 3 [IU] via SUBCUTANEOUS
  Administered 2022-11-29: 5 [IU] via SUBCUTANEOUS
  Administered 2022-11-30: 2 [IU] via SUBCUTANEOUS

## 2022-11-24 MED ORDER — ACETAMINOPHEN 650 MG RE SUPP: 650.0000 mg | Freq: Four times a day (QID) | RECTAL | Status: DC | PRN

## 2022-11-24 MED ORDER — ONDANSETRON HCL 4 MG/2ML IJ SOLN: 4.0000 mg | Freq: Four times a day (QID) | INTRAMUSCULAR | Status: DC | PRN

## 2022-11-24 MED ORDER — INSULIN ASPART 100 UNIT/ML IJ SOLN
0.0000 [IU] | Freq: Every day | INTRAMUSCULAR | Status: DC
  Administered 2022-11-24: 3 [IU] via SUBCUTANEOUS
  Administered 2022-11-26: 2 [IU] via SUBCUTANEOUS
  Administered 2022-11-29: 5 [IU] via SUBCUTANEOUS

## 2022-11-24 MED ORDER — SODIUM CHLORIDE 0.9 % IV SOLN
250.0000 mL | INTRAVENOUS | Status: DC | PRN
  Administered 2022-11-26: 250 mL via INTRAVENOUS

## 2022-11-24 MED ORDER — SODIUM CHLORIDE 0.9% FLUSH
3.0000 mL | Freq: Two times a day (BID) | INTRAVENOUS | Status: DC
  Administered 2022-11-24 – 2022-11-30 (×13): 3 mL via INTRAVENOUS

## 2022-11-24 MED ORDER — SODIUM CHLORIDE 0.9% FLUSH: 3.0000 mL | INTRAVENOUS | Status: DC | PRN

## 2022-11-24 MED ORDER — LORATADINE 10 MG PO TABS
10.0000 mg | ORAL_TABLET | Freq: Every morning | ORAL | Status: DC
  Administered 2022-11-24 – 2022-11-25 (×2): 10 mg via ORAL
  Filled 2022-11-24 (×2): qty 1

## 2022-11-24 MED ORDER — ALBUTEROL SULFATE (2.5 MG/3ML) 0.083% IN NEBU: 3.0000 mL | INHALATION_SOLUTION | Freq: Four times a day (QID) | RESPIRATORY_TRACT | Status: DC | PRN

## 2022-11-24 MED ORDER — ACETAMINOPHEN 325 MG PO TABS
650.0000 mg | ORAL_TABLET | Freq: Four times a day (QID) | ORAL | Status: DC | PRN
  Administered 2022-11-29: 650 mg via ORAL
  Filled 2022-11-24: qty 2

## 2022-11-24 MED ORDER — LACTULOSE 10 GM/15ML PO SOLN
20.0000 g | Freq: Every day | ORAL | Status: DC
  Administered 2022-11-24 – 2022-11-25 (×2): 20 g via ORAL
  Filled 2022-11-24 (×2): qty 30

## 2022-11-24 NOTE — ED Provider Notes (Signed)
[facility] EMERGENCY DEPARTMENT Provider Note   CSN: [id number] Arrival date & time: 11/24/22  0725     History  Chief Complaint  Patient presents with   Weakness    [name] is a 61 y.o. male.   Patient has a history of [name] with cirrhosis and congestive heart failure and coronary artery disease with renal disease and diabetes.  He was just in the hospital for 6 days and was discharged yesterday.  Patient states he used to weak to move around and realizes he needs to go to a nursing home because he cannot take care of himself with only his wife helping  The history is provided by the patient and medical records. No language interpreter was used.  Weakness Severity:  Severe Onset quality:  Gradual Timing:  Constant Progression:  Worsening Chronicity:  Recurrent Context: not alcohol use   Relieved by:  Nothing Worsened by:  Nothing Ineffective treatments:  None tried Associated symptoms: no abdominal pain, no chest pain, no cough, no diarrhea, no frequency, no headaches and no seizures        Home Medications Prior to Admission medications   Medication Sig Start Date End Date Taking? Authorizing Provider  acetaminophen (TYLENOL) 325 MG tablet Take 650 mg by mouth every 6 (six) hours as needed for mild pain. 12/19/20  Yes [provider]  albuterol (VENTOLIN HFA) 108 (90 Base) MCG/ACT inhaler Inhale 2 puffs into the lungs every 6 (six) hours as needed for wheezing or shortness of breath. 12/22/20  Yes Gerlene Fee, NP  ALPRAZolam Duanne Moron) 0.25 MG tablet Take 0.25 mg by mouth daily. 02/20/21  Yes [provider]  Armodafinil 50 MG tablet Take 1 tablet (50 mg total) by mouth as needed. 06/03/22  Yes Dohmeier, Asencion Partridge, MD  aspirin EC 81 MG tablet Take 1 tablet (81 mg total) by mouth every evening. 10/23/22  Yes Johnson, Clanford L, MD  Cholecalciferol (DIALYVITE VITAMIN D 5000) 125 MCG (5000 UT) capsule Take 5,000 Units by mouth in the morning.    Yes  [provider]  insulin aspart (NOVOLOG) 100 UNIT/ML injection Inject 5 Units into the skin 3 (three) times daily before meals. Special Instructions: If accu-check is greater than 150. Hold for accu-check 150 or below. With Meals 12/22/20  Yes Gerlene Fee, NP  insulin degludec (TRESIBA) 100 UNIT/ML FlexTouch Pen Inject 52 Units into the skin daily. 11/07/22 02/05/23 Yes [provider]  lactulose (CHRONULAC) 10 GM/15ML solution Take 30 mLs (20 g total) by mouth daily. 12/22/20  Yes Gerlene Fee, NP  loratadine (CLARITIN) 10 MG tablet Take 10 mg by mouth in the morning.    Yes [provider]  metolazone (ZAROXOLYN) 2.5 MG tablet Take 1 tablet (2.5 mg total) by mouth once a week. Every Wedenesday 11/23/22  Yes Barton Dubois, MD  midodrine (PROAMATINE) 10 MG tablet Take 1 tablet (10 mg total) by mouth 3 (three) times daily with meals. 08/25/21  Yes Emokpae, Courage, MD  nadolol (CORGARD) 20 MG tablet Take 1 tablet (20 mg total) by mouth daily. 06/07/22  Yes Imogene Burn, PA-C  oxyCODONE (OXY IR/ROXICODONE) 5 MG immediate release tablet Take 5 mg by mouth every 4 (four) hours as needed for moderate pain. 11/11/22  Yes [provider]  potassium chloride SA (KLOR-CON M) 20 MEQ tablet TAKE 1 TABLET(20 MEQ) BY MOUTH DAILY 09/27/22  Yes Satira Sark, MD  pravastatin (PRAVACHOL) 20 MG tablet TAKE 1 TABLET(20 MG) BY MOUTH  AT BEDTIME 09/08/22  Yes Strader, [place] M, PA-C  spironolactone (ALDACTONE) 100 MG tablet Take 1 tablet (100 mg total) by mouth daily. 10/23/22  Yes Johnson, Clanford L, MD  torsemide (DEMADEX) 20 MG tablet Take 3 tablets (60 mg total) by mouth 2 (two) times daily. 11/08/22 11/03/23 Yes Satira Sark, MD  venlafaxine XR (EFFEXOR-XR) 75 MG 24 hr capsule Take 1 capsule (75 mg total) by mouth daily with breakfast. 08/25/21  Yes Emokpae, Courage, MD  Continuous Blood Gluc Receiver (DEXCOM G5 RECEIVER KIT) DEVI Use to check/monitor sugar.  11/23/22   Barton Dubois, MD  Continuous Blood Gluc Sensor (FREESTYLE LIBRE 14 DAY SENSOR) MISC SMARTSIG:1 Each Topical Every 2 Weeks 10/03/21   [provider]  nitroGLYCERIN (NITROSTAT) 0.4 MG SL tablet Place 1 tablet (0.4 mg total) under the tongue every 5 (five) minutes as needed for chest pain. Do not exceed 3 doses in 15 mins 11/04/21 09/08/22  Hilty, Nadean Corwin, MD  pantoprazole (PROTONIX) 40 MG tablet Take 1 tablet (40 mg total) by mouth daily. 08/25/21 09/08/22  Roxan Hockey, MD      Allergies    Fluticasone    Review of Systems   Review of Systems  Constitutional:  Negative for appetite change and fatigue.  HENT:  Negative for congestion, ear discharge and sinus pressure.   Eyes:  Negative for discharge.  Respiratory:  Negative for cough.   Cardiovascular:  Negative for chest pain.  Gastrointestinal:  Negative for abdominal pain and diarrhea.  Genitourinary:  Negative for frequency and hematuria.  Musculoskeletal:  Negative for back pain.  Skin:  Negative for rash.  Neurological:  Positive for weakness. Negative for seizures and headaches.  Psychiatric/Behavioral:  Negative for hallucinations.     Physical Exam Updated Vital Signs BP 108/73   Pulse 69   Temp 97.9 F (36.6 C)   Resp 16   SpO2 100%  Physical Exam Vitals and nursing note reviewed.  Constitutional:      Appearance: He is well-developed.  HENT:     Head: Normocephalic.     Nose: Nose normal.  Eyes:     General: No scleral icterus.    Conjunctiva/sclera: Conjunctivae normal.  Neck:     Thyroid: No thyromegaly.  Cardiovascular:     Rate and Rhythm: Normal rate and regular rhythm.     Heart sounds: No murmur heard.    No friction rub. No gallop.  Pulmonary:     Breath sounds: No stridor. No wheezing or rales.  Chest:     Chest wall: No tenderness.  Abdominal:     General: There is no distension.     Tenderness: There is no abdominal tenderness. There is no rebound.     Comments:  Distended abdomen  Musculoskeletal:        General: Normal range of motion.     Cervical back: Neck supple.  Lymphadenopathy:     Cervical: No cervical adenopathy.  Skin:    Findings: No erythema or rash.  Neurological:     Mental Status: He is oriented to person, place, and time.     Motor: No abnormal muscle tone.     Coordination: Coordination normal.  Psychiatric:        Behavior: Behavior normal.     ED Results / Procedures / Treatments   Labs (all labs ordered are listed, but only abnormal results are displayed) Labs Reviewed  CBC WITH DIFFERENTIAL/PLATELET - Abnormal; Notable for the following components:  Result Value   RBC 3.08 (*)    Hemoglobin 9.9 (*)    HCT 29.4 (*)    RDW 17.0 (*)    Platelets 142 (*)    All other components within normal limits  COMPREHENSIVE METABOLIC PANEL - Abnormal; Notable for the following components:   Sodium 129 (*)    Potassium 5.4 (*)    Chloride 94 (*)    Glucose, Bld 277 (*)    BUN 31 (*)    Creatinine, Ser 1.42 (*)    Calcium 8.5 (*)    Albumin 2.9 (*)    AST 59 (*)    Total Bilirubin 3.1 (*)    GFR, Estimated 56 (*)    All other components within normal limits    EKG EKG Interpretation  Date/Time:  Wednesday November 24 2022 07:34:53 EST Ventricular Rate:  70 PR Interval:  153 QRS Duration: 96 QT Interval:  412 QTC Calculation: 445 R Axis:   81 Text Interpretation: Sinus rhythm Inferior infarct, age indeterminate Confirmed by Milton Ferguson ([id number]) on 11/24/2022 8:41:51 AM  Radiology DG Chest Port 1 View  Result Date: 11/24/2022 CLINICAL DATA:  Unable to get up, week EXAM: PORTABLE CHEST 1 VIEW COMPARISON:  10/18/2021 FINDINGS: Mild bilateral interstitial thickening. No focal consolidation. No pleural effusion or pneumothorax. Heart and mediastinal contours are unremarkable. Prior CABG. No acute osseous abnormality. IMPRESSION: 1. Mild pulmonary vascular congestion. Electronically Signed   By: Kathreen Devoid  M.D.   On: 11/24/2022 09:33    Procedures Procedures    Medications Ordered in ED Medications  sodium zirconium cyclosilicate (LOKELMA) packet 10 g (has no administration in time range)    ED Course/ Medical Decision Making/ A&P                           Medical Decision Making Amount and/or Complexity of Data Reviewed Labs: ordered. Radiology: ordered.  Risk Decision regarding hospitalization.  This patient presents to the ED for concern of weakness, this involves an extensive number of treatment options, and is a complaint that carries with it a high risk of complications and morbidity.  The differential diagnosis includes worsening congestive heart failure and cirrhosis   Co morbidities that complicate the patient evaluation  Congestive heart failure and cirrhosis   Additional history obtained:  Additional history obtained from patient External records from outside source obtained and reviewed including hospital records   Lab Tests:  I Ordered, and personally interpreted labs.  The pertinent results include: Potassium 5.4, glucose 277, bilirubin 3.1   Imaging Studies ordered:  I ordered imaging studies including chest x-ray I independently visualized and interpreted imaging which showed pulmonary vascular congestion I agree with the radiologist interpretation   Cardiac Monitoring: / EKG:  The patient was maintained on a cardiac monitor.  I personally viewed and interpreted the cardiac monitored which showed an underlying rhythm of: Normal sinus rhythm   Consultations Obtained:  I requested consultation with the hospitalist,  and discussed lab and imaging findings as well as pertinent plan - they recommend: Admit   Problem List / ED Course / Critical interventions / Medication management  Cirrhosis and congestive heart failure No medicines given Reevaluation of the patient after these medicines showed that the patient stayed the same I have reviewed  the patients home medicines and have made adjustments as needed   Social Determinants of Health:  None   Test / Admission - Considered:  None  Patient  will be admitted for worsening weakness from cirrhosis and congestive heart failure, and hyperkalemia        Final Clinical Impression(s) / ED Diagnoses Final diagnoses:  Weakness  Hyperkalemia    Rx / DC Orders ED Discharge Orders     None         Milton Ferguson, MD 11/24/22 1807

## 2022-11-24 NOTE — Inpatient Diabetes Management (Signed)
Inpatient Diabetes Program Recommendations  AACE/ADA: New Consensus Statement on Inpatient Glycemic Control (2015)  Target Ranges:  Prepandial:   less than 140 mg/dL      Peak postprandial:   less than 180 mg/dL (1-2 hours)      Critically ill patients:  140 - 180 mg/dL   Lab Results  Component Value Date   GLUCAP 314 (H) 11/23/2022   HGBA1C 5.6 10/21/2022    Review of Glycemic Control  Latest Reference Range & Units 11/23/22 03:53 11/23/22 07:55 11/23/22 11:58  Glucose-Capillary 70 - 99 mg/dL 183 (H) 161 (H) 314 (H)  (H): Data is abnormally high Diabetes history: Type 2 DM Outpatient Diabetes medications: Tresiba 52 units QD, Novolog 5 units TID Current orders for Inpatient glycemic control: none  Inpatient Diabetes Program Recommendations:    Consider: -Adding Semglee 28 units QD -Adding Novolog 0-9 units TID & HS -Adding Novolog 14 units TID (assuming patient is consuming >50% of meals)  Thanks, Bronson Curb, MSN, RNC-OB Diabetes Coordinator [phone] (8a-5p)

## 2022-11-24 NOTE — H&P (Addendum)
History and Physical    [name] JIR:[id number] DOB: 26-Sep-1961 DOA: 11/24/2022  PCP: [name], MD   Patient coming from: Home  Chief Complaint: Weakness  HPI: [name] is a 61 y.o. male with medical history significant for [name] cirrhosis with ascites, chronic kidney disease stage IIIa, obstructive sleep apnea on CPAP, type 2 diabetes with nephropathy, anemia of chronic kidney disease, diastolic heart failure and class II obesity who was just recently discharged from the hospital on 12/12 with treatment of a ascites with paracentesis after he presented for generalized weakness and increased weight gain and shortness of breath.  He had 4 L of fluid removed on 12/7 and appears to require another paracentesis and is complaining again of significant weakness and inability to ambulate at home.     ED Course: Stable vital signs noted and hemoglobin 9.9 with platelets of 142.  Potassium 5.4 and creatinine 1.42 and BUN 31.  Sodium is 129 and blood glucose levels are elevated in the 200-300 range.  Review of Systems: Reviewed as noted above, otherwise negative.  Past Medical History:  Diagnosis Date   Anemia    Arthritis    CAD (coronary artery disease)    Multivessel disease status post CABG 08/2015   Cirrhosis ([place])    CKD (chronic kidney disease) stage 3, GFR 30-59 ml/min (HCC)    Diastolic CHF (HCC)    Essential hypertension    Hyperlipidemia    Iron deficiency anemia 07/15/2021   OSA on CPAP    Polyclonal gammopathy    Thrombocytopenia (HCC) 2016   Type 2 diabetes mellitus ([place])     Past Surgical History:  Procedure Laterality Date   APPENDECTOMY     Biceps tendon surgery Right    BIOPSY  11/22/2019   Procedure: BIOPSY;  Surgeon: Daneil Dolin, MD;  [facility]: [facility];  Service: Endoscopy;;   CARDIAC CATHETERIZATION N/A 08/25/2015   Procedure: Left Heart Cath and Coronary Angiography;  Surgeon: Belva Crome, MD;  Location: [facility];   Service: Cardiovascular;  Laterality: N/A;   CATARACT EXTRACTION Left 08/18/2022   COLONOSCOPY WITH PROPOFOL N/A 11/22/2019   Procedure: COLONOSCOPY WITH PROPOFOL;  Surgeon: Daneil Dolin, MD; Four 4-5 mm polyps, findings suggestive of portal colopathy, congested appearing colonic mucosa diffusely, rectal varices, and adequate right colon prep.  Pathology with tubular adenomas and hyperplastic polyp.  Right colon biopsy with focal active colitis.  Recommendations to repeat colonoscopy in 3 months due to poor prep.   COLONOSCOPY WITH PROPOFOL N/A 03/17/2020   Procedure: COLONOSCOPY WITH PROPOFOL;  Surgeon: Daneil Dolin, MD;  [facility]: [facility];  Service: Endoscopy;  Laterality: N/A;  8:45am - pt does not need covid test, was + 2/4 <90 days   CORONARY ARTERY BYPASS GRAFT N/A 08/29/2015   Procedure: CORONARY ARTERY BYPASS GRAFTING (CABG);  Surgeon: [name], MD;  Location: [facility];  Service: Open Heart Surgery;  Laterality: N/A;   ESOPHAGEAL BANDING N/A 07/23/2021   Procedure: ESOPHAGEAL BANDING;  Surgeon: Eloise Harman, DO;  [facility];  Service: Endoscopy;  Laterality: N/A;   ESOPHAGOGASTRODUODENOSCOPY (EGD) WITH PROPOFOL N/A 11/22/2019   Procedure: ESOPHAGOGASTRODUODENOSCOPY (EGD) WITH PROPOFOL;  Surgeon: Daneil Dolin, MD; 4 columns of grade 2-3 esophageal varices, portal gastropathy, gastric polyp/abnormal gastric mucosa s/p biopsy.  Pathology with hyperplastic polyp, mild chronic gastritis, negative H. pylori.   ESOPHAGOGASTRODUODENOSCOPY (EGD) WITH PROPOFOL N/A 07/23/2021   Grade 2 esophageal varices without stigmata  of bleeding, completely eradicated with banding, portal gastropathy, GAVE without bleeding.   KNEE ARTHROSCOPY Left    TEE WITHOUT CARDIOVERSION N/A 08/29/2015   Procedure: TRANSESOPHAGEAL ECHOCARDIOGRAM (TEE);  Surgeon: [name], MD;  Location: [place];  Service: Open Heart Surgery;  Laterality: N/A;   TOTAL KNEE ARTHROPLASTY Left  03/09/2017   Procedure: TOTAL KNEE ARTHROPLASTY;  Surgeon: Carole Civil, MD;  Location: [facility];  Service: Orthopedics;  Laterality: Left;     reports that he has never smoked. He has never used smokeless tobacco. He reports that he does not currently use alcohol. He reports that he does not use drugs.  Allergies  Allergen Reactions   Fluticasone Rash    Family History  Problem Relation Age of Onset   Arthritis Other    Cancer Other    Diabetes Other    CAD Father    Diabetes Mellitus II Father    Liver cancer Father    Hodgkin's lymphoma Father    CAD Brother    Diabetes Mellitus II Brother    ALS Mother    Diabetes Mellitus II Sister    Diabetes Mellitus II Brother    Diabetes Mellitus II Maternal Grandmother    Aneurysm Maternal Grandmother    Cancer Maternal Grandfather    Anesthesia problems Neg Hx    Hypotension Neg Hx    Malignant hyperthermia Neg Hx    Pseudochol deficiency Neg Hx    Colon cancer Neg Hx     Prior to Admission medications   Medication Sig Start Date End Date Taking? Authorizing Provider  acetaminophen (TYLENOL) 325 MG tablet Take 650 mg by mouth every 6 (six) hours as needed for mild pain. 12/19/20  Yes [provider]  albuterol (VENTOLIN HFA) 108 (90 Base) MCG/ACT inhaler Inhale 2 puffs into the lungs every 6 (six) hours as needed for wheezing or shortness of breath. 12/22/20  Yes Gerlene Fee, NP  ALPRAZolam Duanne Moron) 0.25 MG tablet Take 0.25 mg by mouth daily. 02/20/21  Yes [provider]  Armodafinil 50 MG tablet Take 1 tablet (50 mg total) by mouth as needed. 06/03/22  Yes Dohmeier, Asencion Partridge, MD  aspirin EC 81 MG tablet Take 1 tablet (81 mg total) by mouth every evening. 10/23/22  Yes Johnson, Clanford L, MD  Cholecalciferol (DIALYVITE VITAMIN D 5000) 125 MCG (5000 UT) capsule Take 5,000 Units by mouth in the morning.    Yes [provider]  insulin aspart (NOVOLOG) 100 UNIT/ML injection Inject 5 Units into the skin  3 (three) times daily before meals. Special Instructions: If accu-check is greater than 150. Hold for accu-check 150 or below. With Meals 12/22/20  Yes Gerlene Fee, NP  insulin degludec (TRESIBA) 100 UNIT/ML FlexTouch Pen Inject 52 Units into the skin daily. 11/07/22 02/05/23 Yes [provider]  lactulose (CHRONULAC) 10 GM/15ML solution Take 30 mLs (20 g total) by mouth daily. 12/22/20  Yes Gerlene Fee, NP  loratadine (CLARITIN) 10 MG tablet Take 10 mg by mouth in the morning.    Yes [provider]  metolazone (ZAROXOLYN) 2.5 MG tablet Take 1 tablet (2.5 mg total) by mouth once a week. Every Wedenesday 11/23/22  Yes Barton Dubois, MD  midodrine (PROAMATINE) 10 MG tablet Take 1 tablet (10 mg total) by mouth 3 (three) times daily with meals. 08/25/21  Yes Emokpae, Courage, MD  nadolol (CORGARD) 20 MG tablet Take 1 tablet (20 mg total) by mouth daily. 06/07/22  Yes Imogene Burn,  PA-C  oxyCODONE (OXY IR/ROXICODONE) 5 MG immediate release tablet Take 5 mg by mouth every 4 (four) hours as needed for moderate pain. 11/11/22  Yes [provider]  potassium chloride SA (KLOR-CON M) 20 MEQ tablet TAKE 1 TABLET(20 MEQ) BY MOUTH DAILY 09/27/22  Yes Satira Sark, MD  pravastatin (PRAVACHOL) 20 MG tablet TAKE 1 TABLET(20 MG) BY MOUTH AT BEDTIME 09/08/22  Yes Strader, [place] M, PA-C  spironolactone (ALDACTONE) 100 MG tablet Take 1 tablet (100 mg total) by mouth daily. 10/23/22  Yes Johnson, Clanford L, MD  torsemide (DEMADEX) 20 MG tablet Take 3 tablets (60 mg total) by mouth 2 (two) times daily. 11/08/22 11/03/23 Yes Satira Sark, MD  venlafaxine XR (EFFEXOR-XR) 75 MG 24 hr capsule Take 1 capsule (75 mg total) by mouth daily with breakfast. 08/25/21  Yes Emokpae, Courage, MD  Continuous Blood Gluc Receiver (DEXCOM G5 RECEIVER KIT) DEVI Use to check/monitor sugar. 11/23/22   Barton Dubois, MD  Continuous Blood Gluc Sensor (FREESTYLE LIBRE 14 DAY SENSOR) MISC  SMARTSIG:1 Each Topical Every 2 Weeks 10/03/21   [provider]  nitroGLYCERIN (NITROSTAT) 0.4 MG SL tablet Place 1 tablet (0.4 mg total) under the tongue every 5 (five) minutes as needed for chest pain. Do not exceed 3 doses in 15 mins 11/04/21 09/08/22  Hilty, Nadean Corwin, MD  pantoprazole (PROTONIX) 40 MG tablet Take 1 tablet (40 mg total) by mouth daily. 08/25/21 09/08/22  Roxan Hockey, MD    Physical Exam: Vitals:   11/24/22 0800 11/24/22 0915 11/24/22 0930 11/24/22 1030  BP: 98/67 97/67 108/73 98/71  Pulse: 73 70 69 70  Resp: _0 Temp:      SpO2: 99% 100% 100% 100%    Constitutional: NAD, calm, comfortable Vitals:   11/24/22 0800 11/24/22 0915 11/24/22 0930 11/24/22 1030  BP: 98/67 97/67 108/73 98/71  Pulse: 73 70 69 70  Resp: _1 Temp:      SpO2: 99% 100% 100% 100%   Eyes: lids and conjunctivae normal Neck: normal, supple Respiratory: clear to auscultation bilaterally. Normal respiratory effort. No accessory muscle use.  Cardiovascular: Regular rate and rhythm, no murmurs. Abdomen: no tenderness, abdomen distended Musculoskeletal:  No edema. Skin: Chronic venous ulcer changes to bilateral lower extremities Psychiatric: Flat affect  Labs on Admission: I have personally reviewed following labs and imaging studies  CBC: Recent Labs  Lab 11/18/22 0313 11/18/22 0854 11/21/22 0129 11/24/22 0746  WBC 6.4 5.7 4.6 4.1  NEUTROABS  --   --   --  2.5  HGB 9.9* 9.8* 9.6* 9.9*  HCT 28.7* 29.5* 28.6* 29.4*  MCV 94.1 94.9 95.0 95.5  PLT 166 141* 125* 426*   Basic Metabolic Panel: Recent Labs  Lab 11/18/22 0854 11/20/22 0402 11/21/22 0605 11/22/22 0348 11/23/22 0329 11/24/22 0746  NA  --  130* 130* 131* 132* 129*  K  --  3.7 4.3 4.3 4.4 5.4*  CL  --  97* 97* 97* 98 94*  CO2  --  _2 GLUCOSE  --  249* 152* 166* 189* 277*  BUN  --  25* 24* 25* 27* 31*  CREATININE 1.22 1.28* 1.28* 1.34* 1.41* 1.42*  CALCIUM  --  8.2* 8.3* 8.2*  8.7* 8.5*  MG 2.1  --   --   --   --   --   PHOS 2.9  --   --   --   --   --  GFR: Estimated Creatinine Clearance: 68.9 mL/min (A) (by C-G formula based on SCr of 1.42 mg/dL (H)). Liver Function Tests: Recent Labs  Lab 11/24/22 0746  AST 59*  ALT 14  ALKPHOS 85  BILITOT 3.1*  PROT 7.6  ALBUMIN 2.9*   No results for input(s): "LIPASE", "AMYLASE" in the last 168 hours. Recent Labs  Lab 11/18/22 0431  AMMONIA 34   Coagulation Profile: No results for input(s): "INR", "PROTIME" in the last 168 hours. Cardiac Enzymes: No results for input(s): "CKTOTAL", "CKMB", "CKMBINDEX", "TROPONINI" in the last 168 hours. BNP (last 3 results) No results for input(s): "PROBNP" in the last 8760 hours. HbA1C: No results for input(s): "HGBA1C" in the last 72 hours. CBG: Recent Labs  Lab 11/22/22 1707 11/22/22 2147 11/23/22 0353 11/23/22 0755 11/23/22 1158  GLUCAP 178* 195* 183* 161* 314*   Lipid Profile: No results for input(s): "CHOL", "HDL", "LDLCALC", "TRIG", "CHOLHDL", "LDLDIRECT" in the last 72 hours. Thyroid Function Tests: No results for input(s): "TSH", "T4TOTAL", "FREET4", "T3FREE", "THYROIDAB" in the last 72 hours. Anemia Panel: No results for input(s): "VITAMINB12", "FOLATE", "FERRITIN", "TIBC", "IRON", "RETICCTPCT" in the last 72 hours. Urine analysis:    Component Value Date/Time   COLORURINE YELLOW 11/18/2022 0313   APPEARANCEUR CLEAR 11/18/2022 0313   LABSPEC 1.010 11/18/2022 0313   PHURINE 6.0 11/18/2022 0313   GLUCOSEU NEGATIVE 11/18/2022 0313   HGBUR MODERATE (A) 11/18/2022 0313   BILIRUBINUR NEGATIVE 11/18/2022 0313   KETONESUR NEGATIVE 11/18/2022 0313   PROTEINUR NEGATIVE 11/18/2022 0313   UROBILINOGEN 1.0 08/28/2015 1634   NITRITE NEGATIVE 11/18/2022 0313   LEUKOCYTESUR NEGATIVE 11/18/2022 0313    Radiological Exams on Admission: DG Chest Port 1 View  Result Date: 11/24/2022 CLINICAL DATA:  Unable to get up, week EXAM: PORTABLE CHEST 1 VIEW  COMPARISON:  10/18/2021 FINDINGS: Mild bilateral interstitial thickening. No focal consolidation. No pleural effusion or pneumothorax. Heart and mediastinal contours are unremarkable. Prior CABG. No acute osseous abnormality. IMPRESSION: 1. Mild pulmonary vascular congestion. Electronically Signed   By: Kathreen Devoid M.D.   On: 11/24/2022 09:33    EKG: Independently reviewed.  SR 70 bpm.  Assessment/Plan Principal Problem:   Weakness Active Problems:   Essential hypertension   Hyperglycemia   S/P CABG x 4   OSA on CPAP   NASH cirrhosis of liver (HCC)   Anasarca   Class 2 obesity   Hyponatremia   CKD stage 3 due to type 2 diabetes mellitus (HCC)   Anemia   CAD (coronary artery disease)   HLD (hyperlipidemia)   Chronic idiopathic thrombocytopenia (HCC)   Decompensated hepatic cirrhosis (HCC)   Ascites    Generalized weakness in the setting of recurrent ascites due to NASH cirrhosis-decompensated cirrhosis - plan to give albumin for now and await paracentesis once abdomen is more tense and plan to do 4 L paracentesis as needed -Fall precautions -PT evaluation  Hyperkalemia -[place] x 1 -Monitor on tele and recheck am labs  Mild hyponatremia -Likely due to volume overload -Monitor with paracentesis  Chronic diastolic CHF -No indication for exacerbation at this time, monitor  Stage II pressure injury to sacrum -Reposition and keep dry -Wound care  Dyslipidemia -continue statin and monitor LFTs  Type 2 diabetes with hyperglycemia -semglee 28 units daily -Novolog 14 units TID -SSI and carb modified diet -Appreciate diabetes coordinator  CKD stage IIIa -Currently stable -Continue to monitor with paracentesis  GERD -PPI  Nash liver cirrhosis -LFTs currently stable -Plan to order a.m. labs to calculate MELD  score  OSA on CPAP -Plan to order CPAP at night  Status post CABG x 4 -Continue aspirin, beta-blocker, Aldactone, and statin  Physical  deconditioning -Appreciate PT reevaluation given worsening weakness and likely need for SNF  Class II obesity -BMI 35.68   DVT prophylaxis: SCDs Code Status: Full Family Communication: None at bedside Disposition Plan: Admit for paracentesis and PT evaluation Consults called: None Admission status: Observation, telemetry  Severity of Illness: The appropriate patient status for this patient is OBSERVATION. Observation status is judged to be reasonable and necessary in order to provide the required intensity of service to ensure the patient's safety. The patient's presenting symptoms, physical exam findings, and initial radiographic and laboratory data in the context of their medical condition is felt to place them at decreased risk for further clinical deterioration. Furthermore, it is anticipated that the patient will be medically stable for discharge from the hospital within 2 midnights of admission.    Tryston Gilliam D Manuella Ghazi DO [facility]  If 7PM-7AM, please contact night-coverage [email or web address]  11/24/2022, 10:57 AM

## 2022-11-24 NOTE — ED Notes (Addendum)
Wife voiced concerns to RT that patient had stroke. Spoke with her and she states that she feels like he has not been acting right since he fell two weeks ago. States that he is forgetful and weak. Patient noted to be sleep, wife states that has been awake all night. MD notified.

## 2022-11-24 NOTE — ED Triage Notes (Signed)
Patient states that he was discharged from hospital yesterday afternoon and placed in recliner at home. States that he is so weak he has been unable to get up. EMS removed patient from chair. States that he is unable to walk. Asked patient about SNF placement and states he does not know why he wasn't discharged there and sent home instead. Complaining of generalized pain from fall two weeks ago.

## 2022-11-24 NOTE — ED Notes (Signed)
Freestyle device removed from right arm for x-ray.

## 2022-11-25 ENCOUNTER — Encounter (HOSPITAL_COMMUNITY)
Admission: EM | Disposition: A | Discharge status: Skilled Nursing Facility | Payer: Self-pay | Source: Home / Self Care | Attending: Internal Medicine

## 2022-11-25 ENCOUNTER — Encounter (HOSPITAL_COMMUNITY): Payer: Self-pay | Admitting: Internal Medicine

## 2022-11-25 ENCOUNTER — Inpatient Hospital Stay (HOSPITAL_COMMUNITY): Payer: Federal, State, Local not specified - PPO | Admitting: Anesthesiology

## 2022-11-25 DIAGNOSIS — R531 Weakness: Secondary | ICD-10-CM | POA: Diagnosis not present

## 2022-11-25 DIAGNOSIS — R188 Other ascites: Secondary | ICD-10-CM | POA: Diagnosis present

## 2022-11-25 DIAGNOSIS — L89152 Pressure ulcer of sacral region, stage 2: Secondary | ICD-10-CM | POA: Diagnosis present

## 2022-11-25 DIAGNOSIS — E2740 Unspecified adrenocortical insufficiency: Secondary | ICD-10-CM | POA: Diagnosis present

## 2022-11-25 DIAGNOSIS — K7581 Nonalcoholic steatohepatitis (NASH): Secondary | ICD-10-CM | POA: Diagnosis present

## 2022-11-25 DIAGNOSIS — J95821 Acute postprocedural respiratory failure: Secondary | ICD-10-CM | POA: Diagnosis not present

## 2022-11-25 DIAGNOSIS — J9601 Acute respiratory failure with hypoxia: Secondary | ICD-10-CM | POA: Diagnosis not present

## 2022-11-25 DIAGNOSIS — E785 Hyperlipidemia, unspecified: Secondary | ICD-10-CM | POA: Diagnosis present

## 2022-11-25 DIAGNOSIS — N179 Acute kidney failure, unspecified: Secondary | ICD-10-CM | POA: Diagnosis not present

## 2022-11-25 DIAGNOSIS — K766 Portal hypertension: Secondary | ICD-10-CM | POA: Diagnosis present

## 2022-11-25 DIAGNOSIS — D631 Anemia in chronic kidney disease: Secondary | ICD-10-CM | POA: Diagnosis present

## 2022-11-25 DIAGNOSIS — K7290 Hepatic failure, unspecified without coma: Secondary | ICD-10-CM | POA: Diagnosis not present

## 2022-11-25 DIAGNOSIS — G4733 Obstructive sleep apnea (adult) (pediatric): Secondary | ICD-10-CM | POA: Diagnosis present

## 2022-11-25 DIAGNOSIS — Z794 Long term (current) use of insulin: Secondary | ICD-10-CM | POA: Diagnosis not present

## 2022-11-25 DIAGNOSIS — E1165 Type 2 diabetes mellitus with hyperglycemia: Secondary | ICD-10-CM | POA: Diagnosis present

## 2022-11-25 DIAGNOSIS — N1831 Chronic kidney disease, stage 3a: Secondary | ICD-10-CM | POA: Diagnosis present

## 2022-11-25 DIAGNOSIS — D62 Acute posthemorrhagic anemia: Secondary | ICD-10-CM | POA: Diagnosis not present

## 2022-11-25 DIAGNOSIS — I8511 Secondary esophageal varices with bleeding: Secondary | ICD-10-CM | POA: Diagnosis present

## 2022-11-25 DIAGNOSIS — K7460 Unspecified cirrhosis of liver: Secondary | ICD-10-CM | POA: Diagnosis present

## 2022-11-25 DIAGNOSIS — K922 Gastrointestinal hemorrhage, unspecified: Secondary | ICD-10-CM | POA: Diagnosis not present

## 2022-11-25 DIAGNOSIS — E1122 Type 2 diabetes mellitus with diabetic chronic kidney disease: Secondary | ICD-10-CM | POA: Diagnosis present

## 2022-11-25 DIAGNOSIS — D693 Immune thrombocytopenic purpura: Secondary | ICD-10-CM | POA: Diagnosis present

## 2022-11-25 DIAGNOSIS — E875 Hyperkalemia: Secondary | ICD-10-CM | POA: Diagnosis present

## 2022-11-25 DIAGNOSIS — E876 Hypokalemia: Secondary | ICD-10-CM | POA: Diagnosis not present

## 2022-11-25 DIAGNOSIS — E871 Hypo-osmolality and hyponatremia: Secondary | ICD-10-CM | POA: Diagnosis present

## 2022-11-25 DIAGNOSIS — D508 Other iron deficiency anemias: Secondary | ICD-10-CM | POA: Diagnosis not present

## 2022-11-25 DIAGNOSIS — I5032 Chronic diastolic (congestive) heart failure: Secondary | ICD-10-CM | POA: Diagnosis present

## 2022-11-25 DIAGNOSIS — E669 Obesity, unspecified: Secondary | ICD-10-CM | POA: Diagnosis present

## 2022-11-25 DIAGNOSIS — I8510 Secondary esophageal varices without bleeding: Secondary | ICD-10-CM | POA: Diagnosis not present

## 2022-11-25 DIAGNOSIS — I130 Hypertensive heart and chronic kidney disease with heart failure and stage 1 through stage 4 chronic kidney disease, or unspecified chronic kidney disease: Secondary | ICD-10-CM | POA: Diagnosis present

## 2022-11-25 HISTORY — PX: ESOPHAGEAL BANDING: SHX5518

## 2022-11-25 HISTORY — PX: ESOPHAGOGASTRODUODENOSCOPY (EGD) WITH PROPOFOL: SHX5813

## 2022-11-25 LAB — HEMOGLOBIN AND HEMATOCRIT, BLOOD
HCT: 23 % — ABNORMAL LOW (ref 39.0–52.0)
Hemoglobin: 7.6 g/dL — ABNORMAL LOW (ref 13.0–17.0)

## 2022-11-25 LAB — BLOOD GAS, ARTERIAL
Acid-Base Excess: 9.3 mmol/L — ABNORMAL HIGH (ref 0.0–2.0)
Bicarbonate: 32.2 mmol/L — ABNORMAL HIGH (ref 20.0–28.0)
Drawn by: 22766
FIO2: 100 %
O2 Saturation: 99.9 %
Patient temperature: 36.8
pCO2 arterial: 36 mmHg (ref 32–48)
pH, Arterial: 7.56 — ABNORMAL HIGH (ref 7.35–7.45)
pO2, Arterial: 422 mmHg — ABNORMAL HIGH (ref 83–108)

## 2022-11-25 LAB — TYPE AND SCREEN
ABO/RH(D): A POS
Antibody Screen: NEGATIVE
Unit division: 0
Unit division: 0

## 2022-11-25 LAB — BPAM RBC
Blood Product Expiration Date: 202401082359
Blood Product Expiration Date: 202401162359
ISSUE DATE / TIME: 202312141431
ISSUE DATE / TIME: 202312141431
Unit Type and Rh: 9500
Unit Type and Rh: 9500

## 2022-11-25 LAB — MRSA NEXT GEN BY PCR, NASAL: MRSA by PCR Next Gen: DETECTED — AB

## 2022-11-25 LAB — GLUCOSE, CAPILLARY
Glucose-Capillary: 241 mg/dL — ABNORMAL HIGH (ref 70–99)
Glucose-Capillary: 245 mg/dL — ABNORMAL HIGH (ref 70–99)
Glucose-Capillary: 135 mg/dL — ABNORMAL HIGH (ref 70–99)
Glucose-Capillary: 147 mg/dL — ABNORMAL HIGH (ref 70–99)

## 2022-11-25 LAB — COMPREHENSIVE METABOLIC PANEL
ALT: 9 U/L (ref 0–44)
AST: 22 U/L (ref 15–41)
Albumin: 3.6 g/dL (ref 3.5–5.0)
Alkaline Phosphatase: 61 U/L (ref 38–126)
Anion gap: 9 (ref 5–15)
BUN: 30 mg/dL — ABNORMAL HIGH (ref 8–23)
CO2: 27 mmol/L (ref 22–32)
Calcium: 9 mg/dL (ref 8.9–10.3)
Chloride: 93 mmol/L — ABNORMAL LOW (ref 98–111)
Creatinine, Ser: 1.54 mg/dL — ABNORMAL HIGH (ref 0.61–1.24)
GFR, Estimated: 50 mL/min — ABNORMAL LOW (ref >60)
Glucose, Bld: 190 mg/dL — ABNORMAL HIGH (ref 70–99)
Potassium: 4.2 mmol/L (ref 3.5–5.1)
Sodium: 129 mmol/L — ABNORMAL LOW (ref 135–145)
Total Bilirubin: 2.9 mg/dL — ABNORMAL HIGH (ref 0.3–1.2)
Total Protein: 7.3 g/dL (ref 6.5–8.1)

## 2022-11-25 LAB — PROTIME-INR
INR: 1.4 — ABNORMAL HIGH (ref 0.8–1.2)
Prothrombin Time: 16.8 seconds — ABNORMAL HIGH (ref 11.4–15.2)

## 2022-11-25 LAB — MAGNESIUM: Magnesium: 2.1 mg/dL (ref 1.7–2.4)

## 2022-11-25 LAB — PREPARE RBC (CROSSMATCH)

## 2022-11-25 LAB — AMMONIA: Ammonia: 64 umol/L — ABNORMAL HIGH (ref 9–35)

## 2022-11-25 SURGERY — ESOPHAGOGASTRODUODENOSCOPY (EGD) WITH PROPOFOL
Anesthesia: General

## 2022-11-25 MED ORDER — LACTATED RINGERS IV SOLN: INTRAVENOUS | Status: DC | PRN

## 2022-11-25 MED ORDER — OCTREOTIDE LOAD VIA INFUSION
50.0000 ug | Freq: Once | INTRAVENOUS | Status: AC
  Administered 2022-11-25: 50 ug via INTRAVENOUS
  Filled 2022-11-25: qty 25

## 2022-11-25 MED ORDER — PROPOFOL 10 MG/ML IV BOLUS
INTRAVENOUS | Status: DC | PRN
  Administered 2022-11-25: 150 mg via INTRAVENOUS

## 2022-11-25 MED ORDER — OCTREOTIDE ACETATE 500 MCG/ML IJ SOLN
INTRAMUSCULAR | Status: AC
  Filled 2022-11-25: qty 1

## 2022-11-25 MED ORDER — MUPIROCIN 2 % EX OINT
1.0000 | TOPICAL_OINTMENT | Freq: Two times a day (BID) | CUTANEOUS | Status: AC
  Administered 2022-11-25 – 2022-11-30 (×10): 1 via NASAL
  Filled 2022-11-25 (×3): qty 22

## 2022-11-25 MED ORDER — FENTANYL CITRATE PF 50 MCG/ML IJ SOSY
50.0000 ug | PREFILLED_SYRINGE | INTRAMUSCULAR | Status: DC | PRN
  Administered 2022-11-25: 50 ug via INTRAVENOUS
  Filled 2022-11-25: qty 1

## 2022-11-25 MED ORDER — SODIUM CHLORIDE 0.9 % IV SOLN
50.0000 ug/h | INTRAVENOUS | Status: DC
  Administered 2022-11-25 – 2022-11-26 (×3): 50 ug/h via INTRAVENOUS
  Filled 2022-11-25 (×5): qty 1

## 2022-11-25 MED ORDER — SODIUM CHLORIDE 0.9 % IV SOLN: INTRAVENOUS | Status: DC

## 2022-11-25 MED ORDER — PANTOPRAZOLE SODIUM 40 MG IV SOLR
40.0000 mg | Freq: Two times a day (BID) | INTRAVENOUS | Status: DC
  Administered 2022-11-25 – 2022-11-29 (×10): 40 mg via INTRAVENOUS
  Filled 2022-11-25 (×11): qty 10

## 2022-11-25 MED ORDER — CHLORHEXIDINE GLUCONATE CLOTH 2 % EX PADS
6.0000 | MEDICATED_PAD | Freq: Every day | CUTANEOUS | Status: DC
  Administered 2022-11-25 – 2022-11-26 (×2): 6 via TOPICAL

## 2022-11-25 MED ORDER — ROCURONIUM 10MG/ML (10ML) SYRINGE FOR MEDFUSION PUMP - OPTIME
INTRAVENOUS | Status: DC | PRN
  Administered 2022-11-25: 50 mg via INTRAVENOUS

## 2022-11-25 MED ORDER — SUCCINYLCHOLINE 20MG/ML (10ML) SYRINGE FOR MEDFUSION PUMP - OPTIME
INTRAMUSCULAR | Status: DC | PRN
  Administered 2022-11-25: 140 mg via INTRAVENOUS

## 2022-11-25 MED ORDER — ORAL CARE MOUTH RINSE: 15.0000 mL | OROMUCOSAL | Status: DC | PRN

## 2022-11-25 MED ORDER — ORAL CARE MOUTH RINSE
15.0000 mL | OROMUCOSAL | Status: DC
  Administered 2022-11-25 – 2022-11-26 (×9): 15 mL via OROMUCOSAL

## 2022-11-25 MED ORDER — EPHEDRINE SULFATE (PRESSORS) 50 MG/ML IJ SOLN
INTRAMUSCULAR | Status: DC | PRN
  Administered 2022-11-25 (×3): 5 mg via INTRAVENOUS

## 2022-11-25 MED ORDER — FENTANYL CITRATE PF 50 MCG/ML IJ SOSY: 50.0000 ug | PREFILLED_SYRINGE | INTRAMUSCULAR | Status: DC | PRN

## 2022-11-25 MED ORDER — LIDOCAINE HCL (CARDIAC) PF 50 MG/5ML IV SOSY
PREFILLED_SYRINGE | INTRAVENOUS | Status: DC | PRN
  Administered 2022-11-25: 50 mg via INTRAVENOUS

## 2022-11-25 MED ORDER — PHENYLEPHRINE HCL (PRESSORS) 10 MG/ML IV SOLN
INTRAVENOUS | Status: DC | PRN
  Administered 2022-11-25 (×3): 160 ug via INTRAVENOUS

## 2022-11-25 MED ORDER — PROPOFOL 1000 MG/100ML IV EMUL
0.0000 ug/kg/min | INTRAVENOUS | Status: DC
  Administered 2022-11-25: 5 ug/kg/min via INTRAVENOUS
  Administered 2022-11-25: 30 ug/kg/min via INTRAVENOUS
  Administered 2022-11-26: 25 ug/kg/min via INTRAVENOUS
  Administered 2022-11-26: 30 ug/kg/min via INTRAVENOUS
  Filled 2022-11-25 (×4): qty 100

## 2022-11-25 MED ORDER — INSULIN GLARGINE-YFGN 100 UNIT/ML ~~LOC~~ SOLN
36.0000 [IU] | Freq: Every day | SUBCUTANEOUS | Status: DC
  Administered 2022-11-27 – 2022-11-30 (×4): 36 [IU] via SUBCUTANEOUS
  Filled 2022-11-25 (×6): qty 0.36

## 2022-11-25 MED ORDER — MIDAZOLAM HCL 2 MG/2ML IJ SOLN
1.0000 mg | INTRAMUSCULAR | Status: DC | PRN
  Administered 2022-11-25: 2 mg via INTRAVENOUS
  Filled 2022-11-25: qty 2

## 2022-11-25 MED ORDER — SODIUM CHLORIDE 0.9 % IV SOLN
1.0000 g | INTRAVENOUS | Status: DC
  Administered 2022-11-25 – 2022-11-28 (×4): 1 g via INTRAVENOUS
  Filled 2022-11-25 (×4): qty 10

## 2022-11-25 NOTE — Plan of Care (Signed)
  Problem: Education: Goal: Knowledge of General Education information will improve Description: Including pain rating scale, medication(s)/side effects and non-pharmacologic comfort measures Outcome: Progressing   Problem: Health Behavior/Discharge Planning: Goal: Ability to manage health-related needs will improve Outcome: Progressing   Problem: Clinical Measurements: Goal: Ability to maintain clinical measurements within normal limits will improve Outcome: Progressing Goal: Will remain free from infection Outcome: Progressing Goal: Diagnostic test results will improve Outcome: Progressing Goal: Respiratory complications will improve Outcome: Progressing Goal: Cardiovascular complication will be avoided Outcome: Progressing   Problem: Activity: Goal: Risk for activity intolerance will decrease Outcome: Progressing   Problem: Nutrition: Goal: Adequate nutrition will be maintained Outcome: Progressing   Problem: Coping: Goal: Level of anxiety will decrease Outcome: Progressing   Problem: Elimination: Goal: Will not experience complications related to bowel motility Outcome: Progressing Goal: Will not experience complications related to urinary retention Outcome: Progressing   Problem: Pain Managment: Goal: General experience of comfort will improve Outcome: Progressing   Problem: Safety: Goal: Ability to remain free from injury will improve Outcome: Progressing   Problem: Skin Integrity: Goal: Risk for impaired skin integrity will decrease Outcome: Progressing   Problem: Education: Goal: Ability to describe self-care measures that may prevent or decrease complications (Diabetes Survival Skills Education) will improve Outcome: Progressing Goal: Individualized Educational Video(s) Outcome: Progressing   Problem: Coping: Goal: Ability to adjust to condition or change in health will improve Outcome: Progressing   Problem: Fluid Volume: Goal: Ability to  maintain a balanced intake and output will improve Outcome: Progressing   Problem: Health Behavior/Discharge Planning: Goal: Ability to identify and utilize available resources and services will improve Outcome: Progressing Goal: Ability to manage health-related needs will improve Outcome: Progressing   Problem: Metabolic: Goal: Ability to maintain appropriate glucose levels will improve Outcome: Progressing   Problem: Nutritional: Goal: Maintenance of adequate nutrition will improve Outcome: Progressing Goal: Progress toward achieving an optimal weight will improve Outcome: Progressing   Problem: Skin Integrity: Goal: Risk for impaired skin integrity will decrease Outcome: Progressing   Problem: Tissue Perfusion: Goal: Adequacy of tissue perfusion will improve Outcome: Progressing   Problem: Education: Goal: Knowledge of General Education information will improve Description: Including pain rating scale, medication(s)/side effects and non-pharmacologic comfort measures Outcome: Progressing   Problem: Health Behavior/Discharge Planning: Goal: Ability to manage health-related needs will improve Outcome: Progressing   Problem: Clinical Measurements: Goal: Ability to maintain clinical measurements within normal limits will improve Outcome: Progressing Goal: Will remain free from infection Outcome: Progressing Goal: Diagnostic test results will improve Outcome: Progressing Goal: Respiratory complications will improve Outcome: Progressing Goal: Cardiovascular complication will be avoided Outcome: Progressing   Problem: Activity: Goal: Risk for activity intolerance will decrease Outcome: Progressing   Problem: Nutrition: Goal: Adequate nutrition will be maintained Outcome: Progressing   Problem: Coping: Goal: Level of anxiety will decrease Outcome: Progressing   Problem: Elimination: Goal: Will not experience complications related to bowel motility Outcome:  Progressing Goal: Will not experience complications related to urinary retention Outcome: Progressing   Problem: Pain Managment: Goal: General experience of comfort will improve Outcome: Progressing   Problem: Safety: Goal: Ability to remain free from injury will improve Outcome: Progressing   Problem: Skin Integrity: Goal: Risk for impaired skin integrity will decrease Outcome: Progressing

## 2022-11-25 NOTE — Anesthesia Preprocedure Evaluation (Addendum)
Anesthesia Evaluation  Patient identified by MRN, date of birth, ID band Patient awake    Reviewed: Allergy & Precautions, NPO status , Patient's Chart, lab work & pertinent test results, reviewed documented beta blocker date and time   Airway Mallampati: III  TM Distance: >3 FB Neck ROM: Full    Dental  (+) Missing, Dental Advisory Given   Pulmonary sleep apnea and Continuous Positive Airway Pressure Ventilation    Pulmonary exam normal breath sounds clear to auscultation       Cardiovascular Exercise Tolerance: Good METS: 3 - Mets hypertension, Pt. on medications (-) angina + CAD, + Past MI, + CABG and +CHF (hx)  Normal cardiovascular exam Rhythm:Regular Rate:Normal  History:   PMH:  Acquired from the patient and from the patient&'s chart.  PMH:  OSA on CPAP. Non-ST elevation MI (NSTEMI) Non-ST elevation MI (NSTEMI)  Risk factors:  Hypertension. Diabetes mellitus. Dyslipidemia.  ------------------------------------------------------------------- Study Conclusions  - Left ventricle: The cavity size was normal. Wall thickness was   increased in a pattern of mild LVH. Systolic function was normal.   The estimated ejection fraction was in the range of 55% to 60%.   Wall motion was normal; there were no regional wall motion   abnormalities. Left ventricular diastolic function parameters   were normal for the patient&'s age. - Aortic valve: Moderately calcified annulus. Trileaflet; mildly   calcified leaflets. There was mild stenosis. Mean gradient (S):   10 mm Hg. Peak gradient (S): 18 mm Hg. Valve area (VTI): 1.68   cm^2. - Mitral valve: Mildly calcified annulus. There was trivial   regurgitation. - Right ventricle: Systolic function was low normal. - Right atrium: Central venous pressure (est): 3 mm Hg. - Atrial septum: No defect or patent foramen ovale was identified. - Tricuspid valve: There was trivial regurgitation. -  Pulmonary arteries: PA peak pressure: 9 mm Hg (S). - Pericardium, extracardiac: There was no pericardial effusion.    Neuro/Psych  PSYCHIATRIC DISORDERS Anxiety     negative neurological ROS     GI/Hepatic ,GERD  Medicated and Controlled,,(+) Cirrhosis  (NASH)  Esophageal Varices and ascites    Esophageal varices    Endo/Other  diabetes, Well Controlled, Type 2, Insulin Dependent, Oral Hypoglycemic Agents    Renal/GU Renal InsufficiencyRenal disease     Musculoskeletal  (+) Arthritis ,    Abdominal   Peds  Hematology  (+) Blood dyscrasia, anemia   Anesthesia Other Findings Ate lunch at 1100, treat like full stomach-urgent/emergent per surgeon  *Morbid obesity *undergoing evaluation to be on liver transplant list * Admitted with acute metabolic encephalopathy *Acute metabolic encephalopathy with history of banded varices  Reproductive/Obstetrics                             Anesthesia Physical Anesthesia Plan  ASA: 4 and emergent  Anesthesia Plan: General   Post-op Pain Management: Minimal or no pain anticipated   Induction: Intravenous  PONV Risk Score and Plan: TIVA and Propofol infusion  Airway Management Planned: Nasal Cannula and Natural Airway  Additional Equipment:   Intra-op Plan:   Post-operative Plan:   Informed Consent: I have reviewed the patients History and Physical, chart, labs and discussed the procedure including the risks, benefits and alternatives for the proposed anesthesia with the patient or authorized representative who has indicated his/her understanding and acceptance.       Plan Discussed with: CRNA  Anesthesia Plan Comments: (Blood will be  available in the OR during case for transfusion if necessary, start with TIVA, will intubate if necessary to protect airway)        Anesthesia Quick Evaluation

## 2022-11-25 NOTE — Anesthesia Procedure Notes (Addendum)
Procedure Name: Intubation Date/Time: 11/25/2022 3:06 PM  Performed by: Ollen Bowl, CRNAPre-anesthesia Checklist: Patient identified, Patient being monitored, Timeout performed, Emergency Drugs available and Suction available Patient Re-evaluated:Patient Re-evaluated prior to induction Oxygen Delivery Method: Circle system utilized Preoxygenation: Pre-oxygenation with 100% oxygen Induction Type: IV induction Ventilation: Mask ventilation without difficulty Laryngoscope Size: Mac and 3 Grade View: Grade I Tube type: Oral Tube size: 7.0 mm Number of attempts: 1 Airway Equipment and Method: Stylet Placement Confirmation: ETT inserted through vocal cords under direct vision, positive ETCO2 and breath sounds checked- equal and bilateral Secured at: 21 cm Tube secured with: Tape Dental Injury: Teeth and Oropharynx as per pre-operative assessment

## 2022-11-25 NOTE — Consult Note (Signed)
Gastroenterology Consult   Referring Provider: No ref. provider found Primary Care Physician:  [name], MD Primary Gastroenterologist:  locally Dr. Lynetta Mare liver  Patient ID: [name]; [id number]; [birth year]   Admit date: 11/24/2022  LOS: 0 days   Date of Consultation: 11/25/2022  Reason for Consultation:  GI bleed in patient with NASH cirrhosis, maroon stools this am and drop in Hgb   History of Present Illness   [name] is a 61 y.o. male with history of NASH cirrhosis, hypertension, obstructive sleep apnea, type 2 diabetes mellitus, history of CAD status post CABG, HFpEF, mild aortic stenosis, adrenal insufficiency (diagnosed April 2022 placed on hydrocortisone and midodrine but no longer on hydrocortisone), chronic kidney disease, polyclonal gammopathy presented back to ED (discharged 11/23/22) via EMS for profound weakness and unable to ambulate at home.   Recent hospitalization in November when he presented with chest pain/epigastric pain with worsening shortness of breath with abdominal distention.  Required paracentesis in the setting of decompensated Nash cirrhosis.  No evidence of SBP on cell count, cultures negative.  Liver Doppler obtained with no signs of thrombosis.  Readmission 11/18/22 for weakness, decreased appetite, early satiety, fluid overload. Discharged 11/23/22. GI was not consulted. Had 4L of ascites removed. No SBP.  Discharged on spironolactone 100 mg daily, torsemide 60 mg twice daily.  Notably he is on midodrine for low blood pressures but remains on nadolol 20 mg daily. [facility] recommended consider stopping nadolol.   Patient presented back to the ED yesterday morning due to profound weakness, unable to walk.  Has been sitting in his recliner since he arrived home yesterday after discharge.  In ED: Hgb 9.9. Potassium 5.4, Creatinine 1.42. BUN 31. Sodium 129. Noted to have stage II pressure injury to sacrum.   Today:  Creatinine 1.54, BUN 30, sodium 129, wbc 3300, Hgb 7.9, Hct 23.7. platelets 107,000. INR 1.4. ammonia 64. Repeat H/H of 7.6/23.   He has had two stools, maroon in color today. Discussed with nursing who witnessed. Patient states at home he has not had any bloody stools or melena. Typically 3-4 BMs daily on lactulose. No abdominal pain. Denies SOB. No N/V. He denies confusion. However in the ED wife reported voiced concerns that he has not been acting right, with being forgetful over the past two weeks since he fell. Patient tells me he is schedule to go to [facility] this month, EGD/colonoscopy in 01/2023.   Previous records:   April 2023 seen at [facility] liver clinic, MELD sodium score of 10 at that time.  Based on committee decision March 2023, he did not meet criteria for liver transplantation and was not added to the wait list because it was felt that his MELD score had improved and it was too early for transplantation.EV Surveillance:       [facility] clinic appointment December 01, 2022 Ultrasound December 10, 2022 EGD and colonoscopy February 2024   Ultrasound liver Doppler protocol May 2023 at [facility]: Impression:  1. Blood flow in the normal direction in the major hepatic blood vessels.  2. Cirrhotic liver with splenomegaly and right upper quadrant ascites.  3. Cholelithiasis without findings for acute cholecystitis.    August 2022: - Grade II esophageal varices with no stigmata of recent bleeding. Completely eradicated. Banded. - Portal hypertensive gastropathy. - Gastric antral vascular ectasia without bleeding. - Normal duodenal bulb, first portion of the duodenum and second portion of the duodenum. - No specimens collected.   Colonoscopy  December 2020: Four 4 to 5 mm polyps at the splenic flexure, removed with a cold snare. Resected and retrieved. Abnormal: Suggestive of portal colopathy. Inadequate preparation. Rectal varices.  Right colon pathology showed focal active colitis.   Splenic flexure polyps tubular adenomas.  Recommended a 16-monthrepeat colonoscopy.   Colonoscopy April 2021: - Preparation of the colon was inadequate. Much of the colon could not be seen today. Rectal varices. - No specimens collected.      Prior to Admission medications   Medication Sig Start Date End Date Taking? Authorizing Provider  acetaminophen (TYLENOL) 325 MG tablet Take 650 mg by mouth every 6 (six) hours as needed for mild pain. 12/19/20  Yes [provider]  albuterol (VENTOLIN HFA) 108 (90 Base) MCG/ACT inhaler Inhale 2 puffs into the lungs every 6 (six) hours as needed for wheezing or shortness of breath. 12/22/20  Yes GGerlene Fee NP  ALPRAZolam (Duanne Moron 0.25 MG tablet Take 0.25 mg by mouth daily. 02/20/21  Yes [provider]  Armodafinil 50 MG tablet Take 1 tablet (50 mg total) by mouth as needed. 06/03/22  Yes Dohmeier, CAsencion Partridge MD  aspirin EC 81 MG tablet Take 1 tablet (81 mg total) by mouth every evening. 10/23/22  Yes Johnson, Clanford L, MD  Cholecalciferol (DIALYVITE VITAMIN D 5000) 125 MCG (5000 UT) capsule Take 5,000 Units by mouth in the morning.    Yes [provider]  insulin aspart (NOVOLOG) 100 UNIT/ML injection Inject 5 Units into the skin 3 (three) times daily before meals. Special Instructions: If accu-check is greater than 150. Hold for accu-check 150 or below. With Meals 12/22/20  Yes GGerlene Fee NP  insulin degludec (TRESIBA) 100 UNIT/ML FlexTouch Pen Inject 52 Units into the skin daily. 11/07/22 02/05/23 Yes [provider]  lactulose (CHRONULAC) 10 GM/15ML solution Take 30 mLs (20 g total) by mouth daily. 12/22/20  Yes GGerlene Fee NP  loratadine (CLARITIN) 10 MG tablet Take 10 mg by mouth in the morning.    Yes [provider]  metolazone (ZAROXOLYN) 2.5 MG tablet Take 1 tablet (2.5 mg total) by mouth once a week. Every Wedenesday 11/23/22  Yes MBarton Dubois MD  midodrine (PROAMATINE) 10 MG tablet Take  1 tablet (10 mg total) by mouth 3 (three) times daily with meals. 08/25/21  Yes Emokpae, Courage, MD  nadolol (CORGARD) 20 MG tablet Take 1 tablet (20 mg total) by mouth daily. 06/07/22  Yes LImogene Burn PA-C  oxyCODONE (OXY IR/ROXICODONE) 5 MG immediate release tablet Take 5 mg by mouth every 4 (four) hours as needed for moderate pain. 11/11/22  Yes [provider]  potassium chloride SA (KLOR-CON M) 20 MEQ tablet TAKE 1 TABLET(20 MEQ) BY MOUTH DAILY 09/27/22  Yes MSatira Sark MD  pravastatin (PRAVACHOL) 20 MG tablet TAKE 1 TABLET(20 MG) BY MOUTH AT BEDTIME 09/08/22  Yes Strader, BTanzaniaM, PA-C  spironolactone (ALDACTONE) 100 MG tablet Take 1 tablet (100 mg total) by mouth daily. 10/23/22  Yes Johnson, Clanford L, MD  torsemide (DEMADEX) 20 MG tablet Take 3 tablets (60 mg total) by mouth 2 (two) times daily. 11/08/22 11/03/23 Yes MSatira Sark MD  venlafaxine XR (EFFEXOR-XR) 75 MG 24 hr capsule Take 1 capsule (75 mg total) by mouth daily with breakfast. 08/25/21  Yes Emokpae, Courage, MD  Continuous Blood Gluc Receiver (DEXCOM G5 RECEIVER KIT) DEVI Use to check/monitor sugar. 11/23/22   MBarton Dubois MD  Continuous Blood Gluc Sensor (FREESTYLE LIBRE 14 DAY  SENSOR) MISC SMARTSIG:1 Each Topical Every 2 Weeks 10/03/21   [provider]  nitroGLYCERIN (NITROSTAT) 0.4 MG SL tablet Place 1 tablet (0.4 mg total) under the tongue every 5 (five) minutes as needed for chest pain. Do not exceed 3 doses in 15 mins 11/04/21 09/08/22  Hilty, Nadean Corwin, MD  pantoprazole (PROTONIX) 40 MG tablet Take 1 tablet (40 mg total) by mouth daily. 08/25/21 09/08/22  Roxan Hockey, MD    Current Facility-Administered Medications  Medication Dose Route Frequency Provider Last Rate Last Admin   0.9 %  sodium chloride infusion  250 mL Intravenous PRN Manuella Ghazi, Pratik D, DO       acetaminophen (TYLENOL) tablet 650 mg  650 mg Oral Q6H PRN Manuella Ghazi, Pratik D, DO       Or   acetaminophen (TYLENOL)  suppository 650 mg  650 mg Rectal Q6H PRN Manuella Ghazi, Pratik D, DO       albumin human 25 % solution 50 g  50 g Intravenous Q6H Shah, Pratik D, DO 60 mL/hr at 11/25/22 0423 50 g at 11/25/22 0423   albuterol (PROVENTIL) (2.5 MG/3ML) 0.083% nebulizer solution 3 mL  3 mL Inhalation Q6H PRN Manuella Ghazi, Pratik D, DO       ALPRAZolam Duanne Moron) tablet 0.25 mg  0.25 mg Oral Daily Manuella Ghazi, Pratik D, DO   0.25 mg at 11/25/22 0902   insulin aspart (novoLOG) injection 0-5 Units  0-5 Units Subcutaneous QHS Manuella Ghazi, Pratik D, DO   3 Units at 11/24/22 2209   insulin aspart (novoLOG) injection 0-9 Units  0-9 Units Subcutaneous TID WC Manuella Ghazi, Pratik D, DO   3 Units at 11/25/22 0913   insulin aspart (novoLOG) injection 14 Units  14 Units Subcutaneous TID WC Shah, Pratik D, DO   14 Units at 11/25/22 0911   insulin glargine-yfgn (SEMGLEE) injection 28 Units  28 Units Subcutaneous Daily Heath Lark D, DO   28 Units at 11/24/22 1155   lactulose (CHRONULAC) 10 GM/15ML solution 20 g  20 g Oral Daily Manuella Ghazi, Pratik D, DO   20 g at 11/25/22 0912   loratadine (CLARITIN) tablet 10 mg  10 mg Oral q AM Manuella Ghazi, Pratik D, DO   10 mg at 11/25/22 8315   metolazone (ZAROXOLYN) tablet 2.5 mg  2.5 mg Oral Weekly Manuella Ghazi, Pratik D, DO   2.5 mg at 11/24/22 1153   midodrine (PROAMATINE) tablet 10 mg  10 mg Oral TID WC Manuella Ghazi, Pratik D, DO   10 mg at 11/25/22 0902   nadolol (CORGARD) tablet 20 mg  20 mg Oral Daily Manuella Ghazi, Pratik D, DO   20 mg at 11/25/22 0923   ondansetron (ZOFRAN) tablet 4 mg  4 mg Oral Q6H PRN Manuella Ghazi, Pratik D, DO       Or   ondansetron (ZOFRAN) injection 4 mg  4 mg Intravenous Q6H PRN Manuella Ghazi, Pratik D, DO       oxyCODONE (Oxy IR/ROXICODONE) immediate release tablet 5 mg  5 mg Oral Q4H PRN Manuella Ghazi, Pratik D, DO       pantoprazole (PROTONIX) injection 40 mg  40 mg Intravenous Q12H Shah, Pratik D, DO       pravastatin (PRAVACHOL) tablet 20 mg  20 mg Oral q1800 Manuella Ghazi, Pratik D, DO   20 mg at 11/24/22 1728   sodium chloride flush (NS) 0.9 % injection 3 mL  3 mL  Intravenous Q12H Shah, Pratik D, DO   3 mL at 11/25/22 0915   sodium chloride flush (NS) 0.9 % injection  3 mL  3 mL Intravenous PRN Manuella Ghazi, Pratik D, DO       spironolactone (ALDACTONE) tablet 100 mg  100 mg Oral Daily Manuella Ghazi, Pratik D, DO   100 mg at 11/25/22 0915   torsemide (DEMADEX) tablet 60 mg  60 mg Oral BID Heath Lark D, DO   60 mg at 11/25/22 0901   venlafaxine XR (EFFEXOR-XR) 24 hr capsule 75 mg  75 mg Oral Q breakfast Heath Lark D, DO   75 mg at 11/25/22 8850    Allergies as of 11/24/2022 - Review Complete 11/24/2022  Allergen Reaction Noted   Fluticasone Rash 04/30/2020    Past Medical History:  Diagnosis Date   Anemia    Arthritis    CAD (coronary artery disease)    Multivessel disease status post CABG 08/2015   Cirrhosis (HCC)    CKD (chronic kidney disease) stage 3, GFR 30-59 ml/min (HCC)    Diastolic CHF ([place])    Essential hypertension    Hyperlipidemia    Iron deficiency anemia 07/15/2021   OSA on CPAP    Polyclonal gammopathy    Thrombocytopenia ([place]) 2016   Type 2 diabetes mellitus ([place])     Past Surgical History:  Procedure Laterality Date   APPENDECTOMY     Biceps tendon surgery Right    BIOPSY  11/22/2019   Procedure: BIOPSY;  Surgeon: Daneil Dolin, MD;  Location: [facility];  Service: Endoscopy;;   CARDIAC CATHETERIZATION N/A 08/25/2015   Procedure: Left Heart Cath and Coronary Angiography;  Surgeon: Belva Crome, MD;  Location: [place] CV LAB;  Service: Cardiovascular;  Laterality: N/A;   CATARACT EXTRACTION Left 08/18/2022   COLONOSCOPY WITH PROPOFOL N/A 11/22/2019   Procedure: COLONOSCOPY WITH PROPOFOL;  Surgeon: Daneil Dolin, MD; Four 4-5 mm polyps, findings suggestive of portal colopathy, congested appearing colonic mucosa diffusely, rectal varices, and adequate right colon prep.  Pathology with tubular adenomas and hyperplastic polyp.  Right colon biopsy with focal active colitis.  Recommendations to repeat colonoscopy in 3 months due to  poor prep.   COLONOSCOPY WITH PROPOFOL N/A 03/17/2020   Procedure: COLONOSCOPY WITH PROPOFOL;  Surgeon: Daneil Dolin, MD;  Location: [facility];  Service: Endoscopy;  Laterality: N/A;  8:45am - pt does not need covid test, was + 2/4 <90 days   CORONARY ARTERY BYPASS GRAFT N/A 08/29/2015   Procedure: CORONARY ARTERY BYPASS GRAFTING (CABG);  Surgeon: [name], MD;  Location: [place];  Service: Open Heart Surgery;  Laterality: N/A;   ESOPHAGEAL BANDING N/A 07/23/2021   Procedure: ESOPHAGEAL BANDING;  Surgeon: Eloise Harman, DO;  [facility];  Service: Endoscopy;  Laterality: N/A;   ESOPHAGOGASTRODUODENOSCOPY (EGD) WITH PROPOFOL N/A 11/22/2019   Procedure: ESOPHAGOGASTRODUODENOSCOPY (EGD) WITH PROPOFOL;  Surgeon: Daneil Dolin, MD; 4 columns of grade 2-3 esophageal varices, portal gastropathy, gastric polyp/abnormal gastric mucosa s/p biopsy.  Pathology with hyperplastic polyp, mild chronic gastritis, negative H. pylori.   ESOPHAGOGASTRODUODENOSCOPY (EGD) WITH PROPOFOL N/A 07/23/2021   Grade 2 esophageal varices without stigmata of bleeding, completely eradicated with banding, portal gastropathy, GAVE without bleeding.   KNEE ARTHROSCOPY Left    TEE WITHOUT CARDIOVERSION N/A 08/29/2015   Procedure: TRANSESOPHAGEAL ECHOCARDIOGRAM (TEE);  Surgeon: [name], MD;  Location: [place];  Service: Open Heart Surgery;  Laterality: N/A;   TOTAL KNEE ARTHROPLASTY Left 03/09/2017   Procedure: TOTAL KNEE ARTHROPLASTY;  Surgeon: Carole Civil, MD;  Location: [facility];  Service: Orthopedics;  Laterality: Left;    Family History  Problem Relation Age of Onset   Arthritis Other    Cancer Other    Diabetes Other    CAD Father    Diabetes Mellitus II Father    Liver cancer Father    Hodgkin's lymphoma Father    CAD Brother    Diabetes Mellitus II Brother    ALS Mother    Diabetes Mellitus II Sister    Diabetes Mellitus II Brother    Diabetes Mellitus II Maternal  Grandmother    Aneurysm Maternal Grandmother    Cancer Maternal Grandfather    Anesthesia problems Neg Hx    Hypotension Neg Hx    Malignant hyperthermia Neg Hx    Pseudochol deficiency Neg Hx    Colon cancer Neg Hx     Social History   Socioeconomic History   Marital status: Married    Spouse name: Not on file   Number of children: 0   Years of education: college   Highest education level: Not on file  Occupational History   Occupation: Maintenance tech    Employer: [employer]  Tobacco Use   Smoking status: Never   Smokeless tobacco: Never  Vaping Use   Vaping Use: Never used  Substance and Sexual Activity   Alcohol use: Not Currently   Drug use: No   Sexual activity: Not Currently  Other Topics Concern   Not on file  Social History Narrative   Not on file   Social Determinants of Health   Financial Resource Strain: Not on file  Food Insecurity: No Food Insecurity (11/24/2022)   Hunger Vital Sign    Worried About Running Out of Food in the Last Year: Never true    Ran Out of Food in the Last Year: Never true  Transportation Needs: No Transportation Needs (11/24/2022)   PRAPARE - Hydrologist (Medical): No    Lack of Transportation (Non-Medical): No  Physical Activity: Not on file  Stress: Not on file  Social Connections: Not on file  Intimate Partner Violence: Not At Risk (11/24/2022)   Humiliation, Afraid, Rape, and Kick questionnaire    Fear of Current or Ex-Partner: No    Emotionally Abused: No    Physically Abused: No    Sexually Abused: No     Review of System:   General: Negative for anorexia, weight loss, fever, chills, fatigue, +weakness. Eyes: Negative for vision changes.  ENT: Negative for hoarseness, difficulty swallowing , nasal congestion. CV: Negative for chest pain, angina, palpitations, dyspnea on exertion,+ peripheral edema.  Respiratory: Negative for dyspnea at rest, dyspnea on exertion, cough, sputum,  wheezing.  GI: See history of present illness. GU:  Negative for dysuria, hematuria, urinary incontinence, urinary frequency, nocturnal urination.  MS: Negative for joint pain, low back pain.  Derm: Negative for rash or itching.  Neuro: Negative for  abnormal sensation, seizure, frequent headaches, memory loss, confusion. +weak, see hpi Psych: Negative for anxiety, depression, suicidal ideation, hallucinations.  Endo: Negative for unusual weight change.  Heme: Negative for bruising or bleeding. Allergy: Negative for rash or hives.      Physical Examination:   Vital signs in last 24 hours: Temp:  [97.9 F (36.6 C)-98.7 F (37.1 C)] 98 F (36.7 C) (12/14 0519) Pulse Rate:  [66-79] 79 (12/14 0519) Resp:  [18-19] 19 (12/14 0519) BP: (100-123)/(69-77) 120/73 (12/14 0519) SpO2:  [97 %-100 %] 98 % (12/14 0519) Weight:  [114.4 kg] 114.4 kg (  12/13 2329) Last BM Date : 11/23/22  General: chronically ill appearing male in NAD. Sitting in recliner.  Head: Normocephalic, atraumatic.   Eyes: Conjunctiva pink, no icterus. Mouth: Oropharyngeal mucosa moist and pink , no lesions erythema or exudate. Neck: Supple without thyromegaly, masses, or lymphadenopathy.  Lungs: Clear to auscultation bilaterally.  Heart: Regular rate and rhythm, no murmurs rubs or gallops.  Abdomen: Bowel sounds are normal, nontender, no hepatosplenomegaly or masses, no abdominal bruits or hernia , no rebound or guarding.  Distended, tense Rectal: not performed Extremities: 1+ pitting edema bilaterally, chronic venous stasis changes. No clubbing, deformity.  Neuro: Alert and oriented x 4 , grossly normal neurologically. No asterixis. Skin: Warm and dry, no jaundice.   Psych: Alert and cooperative, normal mood and affect.        Intake/Output from previous day: 12/13 0701 - 12/14 0700 In: 1100.1 [P.O.:720; IV Piggyback:380.1] Out: 1500 [Urine:1500] Intake/Output this shift: No intake/output data recorded.  Lab  Results:   CBC Recent Labs    11/24/22 0746 11/25/22 0312 11/25/22 0839  WBC 4.1 3.3*  --   HGB 9.9* 7.9* 7.6*  HCT 29.4* 23.7* 23.0*  MCV 95.5 94.8  --   PLT 142* 107*  --    BMET Recent Labs    11/23/22 0329 11/24/22 0746 11/25/22 0312  NA 132* 129* 129*  K 4.4 5.4* 4.2  CL 98 94* 93*  CO2 _0 GLUCOSE 189* 277* 190*  BUN 27* 31* 30*  CREATININE 1.41* 1.42* 1.54*  CALCIUM 8.7* 8.5* 9.0   LFT Recent Labs    11/24/22 0746 11/25/22 0312  BILITOT 3.1* 2.9*  ALKPHOS 85 61  AST 59* 22  ALT 14 9  PROT 7.6 7.3  ALBUMIN 2.9* 3.6    Lipase No results for input(s): "LIPASE" in the last 72 hours.  PT/INR Recent Labs    11/25/22 0312  LABPROT 16.8*  INR 1.4*     Hepatitis Panel No results for input(s): "HEPBSAG", "HCVAB", "HEPAIGM", "HEPBIGM" in the last 72 hours.   Imaging Studies:   DG Chest Port 1 View  Result Date: 11/24/2022 CLINICAL DATA:  Unable to get up, week EXAM: PORTABLE CHEST 1 VIEW COMPARISON:  10/18/2021 FINDINGS: Mild bilateral interstitial thickening. No focal consolidation. No pleural effusion or pneumothorax. Heart and mediastinal contours are unremarkable. Prior CABG. No acute osseous abnormality. IMPRESSION: 1. Mild pulmonary vascular congestion. Electronically Signed   By: Kathreen Devoid M.D.   On: 11/24/2022 09:33   US Paracentesis  Result Date: 11/18/2022 INDICATION: Recurrent ascites; NASH Cirrhosis EXAM: ULTRASOUND GUIDED RLQ PARACENTESIS MEDICATIONS: 10 cc 1% lidocaine. COMPLICATIONS: None immediate. PROCEDURE: Informed written consent was obtained from the patient after a discussion of the risks, benefits and alternatives to treatment. A timeout was performed prior to the initiation of the procedure. Initial ultrasound scanning demonstrates a large amount of ascites within the right lower abdominal quadrant. The right lower abdomen was prepped and draped in the usual sterile fashion. 1% lidocaine was used for local anesthesia.  Following this, a 7 cm Yueh catheter was introduced. An ultrasound image was saved for documentation purposes. The paracentesis was performed. The catheter was removed and a dressing was applied. The patient tolerated the procedure well without immediate post procedural complication. Patient received post-procedure intravenous albumin; see nursing notes for details. FINDINGS: A total of approximately 4 liters of dark yellow fluid was removed. Samples were sent to the laboratory as requested by the clinical team. IMPRESSION: Successful ultrasound-guided paracentesis  yielding 4 liters of peritoneal fluid. PLAN: The patient has required >/=2 paracenteses in a 30 day period. He is currently being followed by the [facility]. IR is available for consideration of TIPS if local care is desired. Read by Lavonia Drafts [facility] Electronically Signed   By: Ruthann Cancer M.D.   On: 11/18/2022 13:10   DG Chest Port 1 View  Result Date: 11/18/2022 CLINICAL DATA:  Weakness, history of fall 2 weeks ago. EXAM: PORTABLE CHEST 1 VIEW COMPARISON:  10/28/2022. FINDINGS: The heart size and mediastinal contours are stable. There is atherosclerotic calcification of the aorta. Lung volumes are low with mild atelectasis at the lung bases. No definite effusion or pneumothorax. Sternotomy wires are noted. No acute osseous abnormality. IMPRESSION: Low lung volumes with atelectasis at the lung bases. Electronically Signed   By: Brett Fairy M.D.   On: 11/18/2022 03:45  [4 week]  Assessment:   61 y/o male with history of NASH cirrhosis (recent decompensation) followed at [facility] liver clinic, hypertension, obstructive sleep apnea, type 2 diabetes mellitus, history of CAD status post CABG, HFpEF, mild aortic stenosis, adrenal insufficiency (diagnosed April 2022 placed on hydrocortisone and midodrine but no longer on hydrocortisone), chronic kidney disease, polyclonal gammopathy presenting for third hospitalization in the past month for  ongoing weakness.  GI consulted this admission due to GI bleeding.  GI bleed: Patient denies any bleeding prior to this morning.  Nursing staff reports maroon-colored stool, twice.  Notable decline in hemoglobin from 9.9-7.6.  Hemodynamically has been stable.  Last known endoscopy August 2022 at which time he had grade 2 esophageal varices with no stigmata of recent bleeding status post banding.  Noted to have portal hypertensive gastropathy.  GAVE without bleeding.  Plans for repeat EGD in July 2023 but I cannot see that this never took place.  Currently scheduled for EGD in February.  Last attempted colonoscopy April 2021, much of the colon cannot be seen, he had rectal varices.  In 2020 he had an attempted colonoscopy and noted to have 4 polyps (tubular adenomas) which were removed, possible portal colopathy, rectal varices, right colon pathology showed focal active colitis.  Due to 2 failed attempts because of poor prep, plans for colonoscopy at [facility].  Would be concerned about possibility of upper GI bleed from GAVE, esophageal varices.  Cannot rule out proximal colonic source.  Would recommend SBP prophylaxis, octreotide, upper endoscopy.  Cirrhosis: More recently has become decompensated.  3 hospitalizations for anasarca recently.  2 paracenteses within 30 days.  This admission MELD Na 24 (MELD Na 10 in 03/2022. MELD Na 14 10/2022).  Ultrasound liver Doppler last admission with no thrombosis.  Plan:   Consider stopping nadolol due to hypotension requiring midodrine.  Rocephin 1 gram daily. Octreotide 31mg bolus, 532m/hr infusion. EGD with possible esophageal variceal banding. Patient ate breakfast and had water 11:30am.  NPO.     LOS: 0 days   We would like to thank you for the opportunity to participate in the care of AlAbelina [name] LeLaureen OchsLeBernarda CaffeyoNorman [facility] [id number]/202310:53 AM

## 2022-11-25 NOTE — Inpatient Diabetes Management (Signed)
Inpatient Diabetes Program Recommendations  AACE/ADA: New Consensus Statement on Inpatient Glycemic Control (2015)  Target Ranges:  Prepandial:   less than 140 mg/dL      Peak postprandial:   less than 180 mg/dL (1-2 hours)      Critically ill patients:  140 - 180 mg/dL   Lab Results  Component Value Date   GLUCAP 245 (H) 11/25/2022   HGBA1C 5.6 10/21/2022    Review of Glycemic Control  Latest Reference Range & Units 11/24/22 12:17 11/24/22 16:00 11/24/22 21:39 11/25/22 07:06 11/25/22 11:05  Glucose-Capillary 70 - 99 mg/dL 206 (H) 222 (H) 262 (H) 241 (H) 245 (H)  (H): Data is abnormally high Diabetes history: Type 2 DM Outpatient Diabetes medications: Tresiba 52 units QD, Novolog 5 units TID Current orders for Inpatient glycemic control: Semglee 28 units QD, Novolog 14 units TID, Novolog 0-9 units TID & HS   Inpatient Diabetes Program Recommendations:     Consider: Increasing Semglee to 36 units QD.   Thanks, Bronson Curb, MSN, RNC-OB Diabetes Coordinator [phone] (8a-5p)

## 2022-11-25 NOTE — NC FL2 (Signed)
[place] LEVEL OF CARE FORM     IDENTIFICATION  Patient Name: [name] Birthdate: [birth year] Sex: male Admission Date (Current Location): 11/24/2022  [facility] and [place] Number:  [employer] and Address:  [facility] [address] [place]      Provider Number: [id number]  Attending Physician Name and Address:  Rodena Goldmann, DO  Relative Name and Phone Number:       Current Level of Care: Hospital Recommended Level of Care: [place] Prior Approval Number:    Date Approved/Denied:   PASRR Number: [id number]  Discharge Plan: SNF    Current Diagnoses: Patient Active Problem List   Diagnosis Date Noted   Weakness 11/23/2022   Pressure injury of skin 11/23/2022   Ascites 11/18/2022   Other ascites 10/22/2022   Liver cirrhosis secondary to NASH ([place]) 10/22/2022   Decompensated hepatic cirrhosis ([place]) 10/22/2022   Chest pain 10/21/2022   Hypervolemia 10/21/2022   Abdominal pain, epigastric 10/21/2022   Nuclear sclerosis, left 08/24/2022   Nuclear sclerotic cataract of both eyes 04/09/2022   Pupillary miosis 04/09/2022   Regular astigmatism of both eyes 04/09/2022   Muscle spasm 08/14/2021   CAD (coronary artery disease) 08/14/2021   HLD (hyperlipidemia) 08/14/2021   Chronic idiopathic thrombocytopenia ([place]) 08/14/2021   Increased ammonia level 08/14/2021   Obese 08/14/2021   Fall at home/Back Pain and Ambulatory Dysfunction 08/14/2021   Hypotension due to hypovolemia 08/14/2021   Hypokalemia 94/85/4627   Acute metabolic encephalopathy 03/50/0938   Chronic kidney disease 07/21/2021   Pure hypercholesterolemia 07/21/2021   GI bleed 07/21/2021   Iron deficiency anemia 07/15/2021   Hyperkalemia 03/17/2021   Elevated troponin 03/17/2021   Back pain 03/17/2021   Hypertensive heart and kidney disease with heart failure and with chronic kidney disease stage III ([place]) 12/17/2020   Acute  on chronic diastolic CHF (congestive heart failure) ([place]) 12/17/2020   CKD stage 3 due to type 2 diabetes mellitus ([place]) 12/17/2020   Hyperlipidemia associated with type 2 diabetes mellitus ([place]) 12/17/2020   Anxiety 12/17/2020   Chronic non-seasonal allergic rhinitis 12/17/2020   Hypotension 12/10/2020   Acute renal failure superimposed on stage 3b chronic kidney disease ([place]) 12/10/2020   Acute renal failure superimposed on stage 3b chronic kidney disease, unspecified acute renal failure type ([place]) 12/10/2020   AKI (acute kidney injury) ([place]) 12/09/2020   GERD (gastroesophageal reflux disease) 10/09/2020   Portal hypertension ([place]) 08/26/2020   Pre-transplant evaluation for liver transplant 08/26/2020   Atherosclerotic heart disease of native coronary artery without angina pectoris 07/17/2020   NASH cirrhosis of liver ([place]) 07/17/2020   Anasarca 07/17/2020   Heart failure, unspecified ([place]) 07/17/2020   Morbid obesity ([place]) 07/17/2020   Acute on chronic heart failure with preserved ejection fraction (HCC)    Liver failure ([place]) 06/20/2020   Esophageal varices ([place]) 01/09/2020   History of adenomatous polyp of colon 01/09/2020   Elevated AST (SGOT) 08/23/2019   Elevation of level of transaminase and lactic acid dehydrogenase (LDH) 08/23/2019   Diarrhea 07/17/2019   Hypersomnia, persistent 03/12/2019   Macrocytic anemia 01/18/2019   Other pancytopenia ([place]) 01/18/2019   Anemia 01/18/2019   Thrombocytopenia ([place]) 12/30/2018   Polyclonal gammopathy 12/30/2018   S/P total knee replacement, left 03/09/17 11/22/2017   Primary osteoarthritis of left knee 03/09/2017   History of nocturia 03/02/2017   OSA on CPAP 03/02/2016   Hypoxia 03/02/2016   Obstructive sleep apnea (adult) (pediatric) 03/02/2016  Cyst of mediastinum 01/15/2016   S/P CABG x 4 08/29/2015   Obesity, Class III, BMI 40-49.9 (morbid obesity) ([place])    Hyperglycemia    Pain in the chest 08/23/2015   Non-ST elevation MI  (NSTEMI) ([place]) 08/23/2015   Diabetes mellitus, type 2 controlled with neurological complications 92/42/6834   Essential hypertension 08/23/2015   Hyperlipidemia 08/23/2015   Type 2 diabetes mellitus with hypoglycemia with coma ([place]) 08/23/2015   Myocardial infarction ([place]) 08/23/2015   Type 2 diabetes mellitus with hyperglycemia ([place]) 2016   Class 2 obesity 2016   Hyponatremia 2016   Osteoarthritis, knee 05/24/2012   Knee pain 10/29/2011   Knee stiffness 10/29/2011   S/P right knee arthroscopy 10/26/2011   Medial meniscus, posterior horn derangement 09/07/2011   Lateral meniscus derangement 09/07/2011   OA (osteoarthritis) of knee 09/07/2011   Rotator cuff syndrome of left shoulder 08/19/2011   Right knee meniscal tear 08/19/2011    Orientation RESPIRATION BLADDER Height & Weight     Self, Time, Situation, Place  Normal External catheter Weight: 252 lb 3.3 oz (114.4 kg) Height:  5' 10.5" (179.1 cm)  BEHAVIORAL SYMPTOMS/MOOD NEUROLOGICAL BOWEL NUTRITION STATUS      Incontinent Diet (NPO time specified. See d/c summary for updates.)  AMBULATORY STATUS COMMUNICATION OF NEEDS Skin   Extensive Assist Verbally Skin abrasions, Bruising, Other (Comment) (Redness to sacrum/scrotum. Skin tear pretibial distal with foam dressing.)                       Personal Care Assistance Level of Assistance  Bathing, Feeding, Dressing Bathing Assistance: Maximum assistance Feeding assistance: Limited assistance Dressing Assistance: Maximum assistance     Functional Limitations Info  Sight, Hearing, Speech Sight Info: Impaired Hearing Info: Adequate Speech Info: Adequate    SPECIAL CARE FACTORS FREQUENCY  PT (By licensed PT)     PT Frequency: 5x weekly              Contractures      Additional Factors Info  Code Status, Allergies, Psychotropic Code Status Info: Full code Allergies Info: Fluticasone Psychotropic Info: Xanax, Effexor         Current Medications  (11/25/2022):  This is the current hospital active medication list Current Facility-Administered Medications  Medication Dose Route Frequency Provider Last Rate Last Admin   0.9 %  sodium chloride infusion  250 mL Intravenous PRN Manuella Ghazi, Pratik D, DO       acetaminophen (TYLENOL) tablet 650 mg  650 mg Oral Q6H PRN Manuella Ghazi, Pratik D, DO       Or   acetaminophen (TYLENOL) suppository 650 mg  650 mg Rectal Q6H PRN Manuella Ghazi, Pratik D, DO       albuterol (PROVENTIL) (2.5 MG/3ML) 0.083% nebulizer solution 3 mL  3 mL Inhalation Q6H PRN Manuella Ghazi, Pratik D, DO       ALPRAZolam Duanne Moron) tablet 0.25 mg  0.25 mg Oral Daily Manuella Ghazi, Pratik D, DO   0.25 mg at 11/25/22 0902   cefTRIAXone (ROCEPHIN) 1 g in sodium chloride 0.9 % 100 mL IVPB  1 g Intravenous Q24H Mahala Menghini, PA-C   Stopped at 11/25/22 1237   insulin aspart (novoLOG) injection 0-5 Units  0-5 Units Subcutaneous QHS Heath Lark D, DO   3 Units at 11/24/22 2209   insulin aspart (novoLOG) injection 0-9 Units  0-9 Units Subcutaneous TID WC Shah, Pratik D, DO   3 Units at 11/25/22 1208   insulin aspart (novoLOG) injection 14 Units  14 Units Subcutaneous TID WC Manuella Ghazi, Pratik D, DO   14 Units at 11/25/22 0911   [START ON 11/26/2022] insulin glargine-yfgn (SEMGLEE) injection 36 Units  36 Units Subcutaneous Daily Manuella Ghazi, Pratik D, DO       lactulose (CHRONULAC) 10 GM/15ML solution 20 g  20 g Oral Daily Manuella Ghazi, Pratik D, DO   20 g at 11/25/22 0912   loratadine (CLARITIN) tablet 10 mg  10 mg Oral q AM Manuella Ghazi, Pratik D, DO   10 mg at 11/25/22 3845   metolazone (ZAROXOLYN) tablet 2.5 mg  2.5 mg Oral Weekly Manuella Ghazi, Pratik D, DO   2.5 mg at 11/24/22 1153   midodrine (PROAMATINE) tablet 10 mg  10 mg Oral TID WC Manuella Ghazi, Pratik D, DO   10 mg at 11/25/22 1223   nadolol (CORGARD) tablet 20 mg  20 mg Oral Daily Manuella Ghazi, Pratik D, DO   20 mg at 11/25/22 3646   octreotide (SANDOSTATIN) 2 mcg/mL load via infusion 50 mcg  50 mcg Intravenous Once Mahala Menghini, PA-C       And   octreotide  (SANDOSTATIN) 500 mcg in sodium chloride 0.9 % 250 mL (2 mcg/mL) infusion  50 mcg/hr Intravenous Continuous Mahala Menghini, PA-C       ondansetron (ZOFRAN) tablet 4 mg  4 mg Oral Q6H PRN Manuella Ghazi, Pratik D, DO       Or   ondansetron (ZOFRAN) injection 4 mg  4 mg Intravenous Q6H PRN Manuella Ghazi, Pratik D, DO       oxyCODONE (Oxy IR/ROXICODONE) immediate release tablet 5 mg  5 mg Oral Q4H PRN Manuella Ghazi, Pratik D, DO       pantoprazole (PROTONIX) injection 40 mg  40 mg Intravenous Q12H Shah, Pratik D, DO   40 mg at 11/25/22 1107   pravastatin (PRAVACHOL) tablet 20 mg  20 mg Oral q1800 Manuella Ghazi, Pratik D, DO   20 mg at 11/24/22 1728   sodium chloride flush (NS) 0.9 % injection 3 mL  3 mL Intravenous Q12H Shah, Pratik D, DO   3 mL at 11/25/22 0915   sodium chloride flush (NS) 0.9 % injection 3 mL  3 mL Intravenous PRN Manuella Ghazi, Pratik D, DO       spironolactone (ALDACTONE) tablet 100 mg  100 mg Oral Daily Manuella Ghazi, Pratik D, DO   100 mg at 11/25/22 0915   torsemide (DEMADEX) tablet 60 mg  60 mg Oral BID Heath Lark D, DO   60 mg at 11/25/22 0901   venlafaxine XR (EFFEXOR-XR) 24 hr capsule 75 mg  75 mg Oral Q breakfast Heath Lark D, DO   75 mg at 11/25/22 8032     Discharge Medications: Please see discharge summary for a list of discharge medications.  Relevant Imaging Results:  Relevant Lab Results:   Additional Information SSN: [id number]. Pt has home cpap.  Salome Arnt, LCSW

## 2022-11-25 NOTE — Plan of Care (Signed)
  Problem: Acute Rehab PT Goals(only PT should resolve) Goal: Pt Will Go Supine/Side To Sit Outcome: Progressing Flowsheets (Taken 11/25/2022 1224) Pt will go Supine/Side to Sit:  with minimal assist  with moderate assist Goal: Patient Will Transfer Sit To/From Stand Outcome: Progressing Flowsheets (Taken 11/25/2022 1224) Patient will transfer sit to/from stand:  with minimal assist  with moderate assist Goal: Pt Will Transfer Bed To Chair/Chair To Bed Outcome: Progressing Flowsheets (Taken 11/25/2022 1224) Pt will Transfer Bed to Chair/Chair to Bed:  with min assist  with mod assist Goal: Pt Will Ambulate Outcome: Progressing Flowsheets (Taken 11/25/2022 1224) Pt will Ambulate:  50 feet  with minimal assist  with rolling walker   12:24 PM, 11/25/22 Lonell Grandchild, MPT Physical Therapist with [facility] [phone] office [phone] mobile phone

## 2022-11-25 NOTE — Transfer of Care (Signed)
Immediate Anesthesia Transfer of Care Note  Patient: [name]  Procedure(s) Performed: ESOPHAGOGASTRODUODENOSCOPY (EGD) WITH PROPOFOL ESOPHAGEAL BANDING  Patient Location: PACU and ICU  Anesthesia Type:General  Level of Consciousness: sedated and Patient remains intubated per anesthesia plan  Airway & Oxygen Therapy: Patient remains intubated per anesthesia plan and Patient placed on Ventilator (see vital sign flow sheet for setting)  Post-op Assessment: Report given to RN and Post -op Vital signs reviewed and stable  Post vital signs: Reviewed and stable  Last Vitals:  Vitals Value Taken Time  BP 121/68 11/25/22 1540  Temp    Pulse 77 11/25/22 1551  Resp 21 11/25/22 1551  SpO2 100 % 11/25/22 1551  Vitals shown include unvalidated device data.  Last Pain:  Vitals:   11/25/22 1505  TempSrc:   PainSc: 0-No pain         Complications: No notable events documented.

## 2022-11-25 NOTE — Progress Notes (Signed)
PROGRESS NOTE    [name]  WER:[id number] DOB: 02-06-1961 DOA: 11/24/2022 PCP: [name], MD   Brief Narrative:    [name] is a 61 y.o. male with medical history significant for [name] cirrhosis with ascites, chronic kidney disease stage IIIa, obstructive sleep apnea on CPAP, type 2 diabetes with nephropathy, anemia of chronic kidney disease, diastolic heart failure and class II obesity who was just recently discharged from the hospital on 12/12 with treatment of a ascites with paracentesis after he presented for generalized weakness and increased weight gain and shortness of breath.  He had 4 L of fluid removed on 12/7 and appears to require another paracentesis and is complaining again of significant weakness and inability to ambulate at home.   He was noted to have GI bleeding with worsening anemia today and underwent EGD with esophageal varices noted and had some banding.  No stigmata of bleeding was noted, however he returned intubated and on ventilator.  Assessment & Plan:   Principal Problem:   Weakness Active Problems:   Essential hypertension   Hyperglycemia   S/P CABG x 4   OSA on CPAP   NASH cirrhosis of liver (HCC)   Anasarca   Class 2 obesity   Hyponatremia   CKD stage 3 due to type 2 diabetes mellitus (HCC)   Anemia   GI bleed   CAD (coronary artery disease)   HLD (hyperlipidemia)   Chronic idiopathic thrombocytopenia (HCC)   Decompensated hepatic cirrhosis (HCC)   Ascites  Assessment and Plan:   Acute blood loss anemia secondary to GI bleed -Status post EGD with esophageal varices and banding performed -Now intubated status post procedure, anticipate extubation with spontaneous breathing trial in a.m. -Rocephin 1 g daily -Octreotide infusion -PPI twice daily -Monitor CBC  Generalized weakness in the setting of recurrent ascites due to NASH cirrhosis-decompensated cirrhosis - plan to give albumin for now and await paracentesis once  abdomen is more tense and plan to do 4 L paracentesis as needed -PT recommending SNF   Mild hyponatremia -Likely due to volume overload -Monitor closely   Chronic diastolic CHF -No indication for exacerbation at this time, monitor   Stage II pressure injury to sacrum -Reposition and keep dry -Wound care   Dyslipidemia -continue statin and monitor LFTs   Type 2 diabetes with hyperglycemia -semglee 28 units daily increased to 36 units -Novolog 14 units TID -SSI 4 hours and holding diet for now -Appreciate diabetes coordinator   CKD stage IIIa -Currently stable -Continue to monitor    GERD -PPI   [place] liver cirrhosis -LFTs currently stable -MELD score of 24   OSA on CPAP -Currently intubated   Status post CABG x 4 -Hold home medications for now   Physical deconditioning -PT recommending SNF   Class II obesity -BMI 35.68    DVT prophylaxis: SCDs Code Status: Full Family Communication: None at bedside Disposition Plan:  Status is: Inpatient Remains inpatient appropriate because: Need for mechanical ventilation, IV medications.   Consultants:  GI  Procedures:  EGD with variceal banding 12/14  Antimicrobials:  Anti-infectives (From admission, onward)    Start     Dose/Rate Route Frequency Ordered Stop   11/25/22 1215  cefTRIAXone (ROCEPHIN) 1 g in sodium chloride 0.9 % 100 mL IVPB        1 g 200 mL/hr over 30 Minutes Intravenous Every 24 hours 11/25/22 1129         Subjective: Patient seen and evaluated today with  noted maroon-colored stools this morning and worsening hemoglobin levels.  He denied any symptomatic complaints or concerns.  He has now returned after EGD on the ventilator and is sedated.  Objective: Vitals:   11/25/22 1431 11/25/22 1548 11/25/22 1554 11/25/22 1600  BP: 105/64 121/68 121/68 119/74  Pulse: 66 75 76 69  Resp:  20 (!) 23 20  Temp: 98.3 F (36.8 C)     TempSrc: Oral     SpO2: 99% 100% 100% 100%  Weight:       Height:        Intake/Output Summary (Last 24 hours) at 11/25/2022 1608 Last data filed at 11/25/2022 1531 Gross per 24 hour  Intake 636.32 ml  Output 2300 ml  Net -1663.68 ml   Filed Weights   11/24/22 2329  Weight: 114.4 kg    Examination:  General exam: Appears calm and comfortable  Respiratory system: Clear to auscultation. Respiratory effort normal. Cardiovascular system: S1 & S2 heard, RRR.  Gastrointestinal system: Abdomen is soft Central nervous system: Alert and awake Extremities: No edema Skin: No significant lesions noted Psychiatry: Flat affect.    Data Reviewed: I have personally reviewed following labs and imaging studies  CBC: Recent Labs  Lab 11/21/22 0129 11/24/22 0746 11/25/22 0312 11/25/22 0839  WBC 4.6 4.1 3.3*  --   NEUTROABS  --  2.5  --   --   HGB 9.6* 9.9* 7.9* 7.6*  HCT 28.6* 29.4* 23.7* 23.0*  MCV 95.0 95.5 94.8  --   PLT 125* 142* 107*  --    Basic Metabolic Panel: Recent Labs  Lab 11/21/22 0605 11/22/22 0348 11/23/22 0329 11/24/22 0746 11/25/22 0312  NA 130* 131* 132* 129* 129*  K 4.3 4.3 4.4 5.4* 4.2  CL 97* 97* 98 94* 93*  CO2 27 28 27 25 27   GLUCOSE 152* 166* 189* 277* 190*  BUN 24* 25* 27* 31* 30*  CREATININE 1.28* 1.34* 1.41* 1.42* 1.54*  CALCIUM 8.3* 8.2* 8.7* 8.5* 9.0  MG  --   --   --   --  2.1   GFR: Estimated Creatinine Clearance: 62.7 mL/min (A) (by C-G formula based on SCr of 1.54 mg/dL (H)). Liver Function Tests: Recent Labs  Lab 11/24/22 0746 11/25/22 0312  AST 59* 22  ALT 14 9  ALKPHOS 85 61  BILITOT 3.1* 2.9*  PROT 7.6 7.3  ALBUMIN 2.9* 3.6   No results for input(s): "LIPASE", "AMYLASE" in the last 168 hours. Recent Labs  Lab 11/25/22 0312  AMMONIA 64*   Coagulation Profile: Recent Labs  Lab 11/25/22 0312  INR 1.4*   Cardiac Enzymes: No results for input(s): "CKTOTAL", "CKMB", "CKMBINDEX", "TROPONINI" in the last 168 hours. BNP (last 3 results) No results for input(s): "PROBNP" in  the last 8760 hours. HbA1C: No results for input(s): "HGBA1C" in the last 72 hours. CBG: Recent Labs  Lab 11/24/22 1217 11/24/22 1600 11/24/22 2139 11/25/22 0706 11/25/22 1105  GLUCAP 206* 222* 262* 241* 245*   Lipid Profile: No results for input(s): "CHOL", "HDL", "LDLCALC", "TRIG", "CHOLHDL", "LDLDIRECT" in the last 72 hours. Thyroid Function Tests: No results for input(s): "TSH", "T4TOTAL", "FREET4", "T3FREE", "THYROIDAB" in the last 72 hours. Anemia Panel: No results for input(s): "VITAMINB12", "FOLATE", "FERRITIN", "TIBC", "IRON", "RETICCTPCT" in the last 72 hours. Sepsis Labs: No results for input(s): "PROCALCITON", "LATICACIDVEN" in the last 168 hours.  Recent Results (from the past 240 hour(s))  Culture, body fluid w Gram Stain-bottle     Status: None  Collection Time: 11/18/22 11:40 AM   Specimen: Ascitic  Result Value Ref Range Status   Specimen Description ASCITIC  Final   Special Requests BOTTLES DRAWN AEROBIC AND ANAEROBIC 10CC  Final   Culture   Final    NO GROWTH 5 DAYS Performed at [facility], [address] [place], [place] [address]    Report Status 11/23/2022 FINAL  Final  Gram stain     Status: None   Collection Time: 11/18/22 11:40 AM   Specimen: Ascitic  Result Value Ref Range Status   Specimen Description ASCITIC  Final   Special Requests NONE  Final   Gram Stain   Final    NO ORGANISMS SEEN WBC PRESENT, PREDOMINANTLY MONONUCLEAR CYTOSPIN SMEAR Performed at [facility], [address], [place], [place] [address]    Report Status 11/18/2022 FINAL  Final         Radiology Studies: DG Chest Port 1 View  Result Date: 11/24/2022 CLINICAL DATA:  Unable to get up, week EXAM: PORTABLE CHEST 1 VIEW COMPARISON:  10/18/2021 FINDINGS: Mild bilateral interstitial thickening. No focal consolidation. No pleural effusion or pneumothorax. Heart and mediastinal contours are unremarkable. Prior CABG. No acute osseous abnormality. IMPRESSION: 1. Mild  pulmonary vascular congestion. Electronically Signed   By: Kathreen Devoid M.D.   On: 11/24/2022 09:33        Scheduled Meds:  ALPRAZolam  0.25 mg Oral Daily   Chlorhexidine Gluconate Cloth  6 each Topical Daily   insulin aspart  0-5 Units Subcutaneous QHS   insulin aspart  0-9 Units Subcutaneous TID WC   insulin aspart  14 Units Subcutaneous TID WC   [START ON 11/26/2022] insulin glargine-yfgn  36 Units Subcutaneous Daily   lactulose  20 g Oral Daily   loratadine  10 mg Oral q AM   metolazone  2.5 mg Oral Weekly   midodrine  10 mg Oral TID WC   nadolol  20 mg Oral Daily   pantoprazole (PROTONIX) IV  40 mg Intravenous Q12H   pravastatin  20 mg Oral q1800   sodium chloride flush  3 mL Intravenous Q12H   spironolactone  100 mg Oral Daily   torsemide  60 mg Oral BID   venlafaxine XR  75 mg Oral Q breakfast   Continuous Infusions:  sodium chloride     cefTRIAXone (ROCEPHIN)  IV Stopped (11/25/22 1237)   octreotide (SANDOSTATIN) 500 mcg in sodium chloride 0.9 % 250 mL (2 mcg/mL) infusion 50 mcg/hr (11/25/22 1549)   propofol (DIPRIVAN) infusion       LOS: 0 days    Time spent: 35 minutes    Denetria Luevanos Darleen Crocker, DO [facility]  If 7PM-7AM, please contact night-coverage [email or web address] 11/25/2022, 4:08 PM

## 2022-11-25 NOTE — Anesthesia Postprocedure Evaluation (Signed)
Anesthesia Post Note  Patient: [name]  Procedure(s) Performed: ESOPHAGOGASTRODUODENOSCOPY (EGD) WITH PROPOFOL ESOPHAGEAL BANDING  Patient location during evaluation: SICU Anesthesia Type: General Level of consciousness: sedated Pain management: pain level controlled Vital Signs Assessment: post-procedure vital signs reviewed and stable Respiratory status: patient remains intubated per anesthesia plan and patient on ventilator - see flowsheet for VS Cardiovascular status: stable Postop Assessment: no apparent nausea or vomiting Anesthetic complications: no   There were no known notable events for this encounter.   Last Vitals:  Vitals:   11/25/22 1431 11/25/22 1554  BP: 105/64 121/68  Pulse: 66 76  Resp:  (!) 23  Temp: 36.8 C   SpO2: 99% 100%    Last Pain:  Vitals:   11/25/22 1505  TempSrc:   PainSc: 0-No pain                 Trixie Rude

## 2022-11-25 NOTE — Addendum Note (Signed)
Addendum  created 11/25/22 1610 by Jonna Munro, CRNA   Intraprocedure Event edited, Intraprocedure Meds edited

## 2022-11-25 NOTE — TOC Initial Note (Addendum)
Transition of Care [facility]) - Initial/Assessment Note    Patient Details  Name: [name] MRN: [id number] Date of Birth: 20-Nov-1961  Transition of [facility]) CM/SW Contact:    Salome Arnt, [place] Phone Number: 11/25/2022, 12:34 PM  Clinical Narrative:  Pt admitted due to weakness. LCSW worked with pt earlier in the week when pt returned home with home health. Pt returned to ED because he was unable to ambulate at home. PT evaluated pt this morning and recommended SNF. Discussed with pt who is agreeable. He has been to [facility] in the past and requests this facility again. Will send referral. Discussed with Debbie at [facility] who will start authorization.                Update: [facility] has made bed offer and Debbie ha started authorization.  Expected Discharge Plan: Skilled Nursing Facility Barriers to Discharge: Continued Medical Work up   Patient Goals and CMS Choice Patient states their goals for this hospitalization and ongoing recovery are:: rehab   Choice offered to / list presented to : Patient  Expected Discharge Plan and Services Expected Discharge Plan: [place] In-house Referral: Clinical Social Work   Post Acute Care Choice: [place] Living arrangements for the past 2 months: [place]                                      Prior Living Arrangements/Services Living arrangements for the past 2 months: Single Family Home Lives with:: Spouse Patient language and need for interpreter reviewed:: Yes Do you feel safe going back to the place where you live?: Yes          Current home services: DME, Home PT (walker, 3N1) Criminal Activity/Legal Involvement Pertinent to Current Situation/Hospitalization: No - Comment as needed  Activities of Daily Living Home Assistive Devices/Equipment: CPAP, Eyeglasses, CBG Meter, Shower chair with back ADL Screening (condition at time of  admission) Patient's cognitive ability adequate to safely complete daily activities?: Yes Is the patient deaf or have difficulty hearing?: No Does the patient have difficulty seeing, even when wearing glasses/contacts?: No Does the patient have difficulty concentrating, remembering, or making decisions?: No Patient able to express need for assistance with ADLs?: Yes Does the patient have difficulty dressing or bathing?: Yes Independently performs ADLs?: No Communication: Independent Dressing (OT): Needs assistance Is this a change from baseline?: Change from baseline, expected to last >3 days Feeding: Independent Bathing: Needs assistance Is this a change from baseline?: Change from baseline, expected to last >3 days Toileting: Needs assistance Is this a change from baseline?: Change from baseline, expected to last >3days In/Out Bed: Needs assistance Is this a change from baseline?: Change from baseline, expected to last >3 days Walks in Home: Needs assistance Is this a change from baseline?: Change from baseline, expected to last >3 days Does the patient have difficulty walking or climbing stairs?: Yes Weakness of Legs: Both Weakness of Arms/Hands: Both  Permission Sought/Granted                  Emotional Assessment     Affect (typically observed): Appropriate Orientation: : Oriented to Self, Oriented to Place, Oriented to  Time, Oriented to Situation Alcohol / Substance Use: Not Applicable Psych Involvement: No (comment)  Admission diagnosis:  Hyperkalemia [E87.5] Weakness [R53.1] Patient Active Problem List   Diagnosis Date Noted  Weakness 11/23/2022   Pressure injury of skin 11/23/2022   Ascites 11/18/2022   Other ascites 10/22/2022   Liver cirrhosis secondary to NASH ([place]) 10/22/2022   Decompensated hepatic cirrhosis ([place]) 10/22/2022   Chest pain 10/21/2022   Hypervolemia 10/21/2022   Abdominal pain, epigastric 10/21/2022   Nuclear sclerosis, left  08/24/2022   Nuclear sclerotic cataract of both eyes 04/09/2022   Pupillary miosis 04/09/2022   Regular astigmatism of both eyes 04/09/2022   Muscle spasm 08/14/2021   CAD (coronary artery disease) 08/14/2021   HLD (hyperlipidemia) 08/14/2021   Chronic idiopathic thrombocytopenia ([place]) 08/14/2021   Increased ammonia level 08/14/2021   Obese 08/14/2021   Fall at home/Back Pain and Ambulatory Dysfunction 08/14/2021   Hypotension due to hypovolemia 08/14/2021   Hypokalemia 37/48/2707   Acute metabolic encephalopathy 86/75/4492   Chronic kidney disease 07/21/2021   Pure hypercholesterolemia 07/21/2021   GI bleed 07/21/2021   Iron deficiency anemia 07/15/2021   Hyperkalemia 03/17/2021   Elevated troponin 03/17/2021   Back pain 03/17/2021   Hypertensive heart and kidney disease with heart failure and with chronic kidney disease stage III ([place]) 12/17/2020   Acute on chronic diastolic CHF (congestive heart failure) ([place]) 12/17/2020   CKD stage 3 due to type 2 diabetes mellitus ([place]) 12/17/2020   Hyperlipidemia associated with type 2 diabetes mellitus ([place]) 12/17/2020   Anxiety 12/17/2020   Chronic non-seasonal allergic rhinitis 12/17/2020   Hypotension 12/10/2020   Acute renal failure superimposed on stage 3b chronic kidney disease ([place]) 12/10/2020   Acute renal failure superimposed on stage 3b chronic kidney disease, unspecified acute renal failure type ([place]) 12/10/2020   AKI (acute kidney injury) ([place]) 12/09/2020   GERD (gastroesophageal reflux disease) 10/09/2020   Portal hypertension ([place]) 08/26/2020   Pre-transplant evaluation for liver transplant 08/26/2020   Atherosclerotic heart disease of native coronary artery without angina pectoris 07/17/2020   NASH cirrhosis of liver ([place]) 07/17/2020   Anasarca 07/17/2020   Heart failure, unspecified ([place]) 07/17/2020   Morbid obesity ([place]) 07/17/2020   Acute on chronic heart failure with preserved ejection fraction (HCC)    Liver failure  ([place]) 06/20/2020   Esophageal varices ([place]) 01/09/2020   History of adenomatous polyp of colon 01/09/2020   Elevated AST (SGOT) 08/23/2019   Elevation of level of transaminase and lactic acid dehydrogenase (LDH) 08/23/2019   Diarrhea 07/17/2019   Hypersomnia, persistent 03/12/2019   Macrocytic anemia 01/18/2019   Other pancytopenia ([place]) 01/18/2019   Anemia 01/18/2019   Thrombocytopenia ([place]) 12/30/2018   Polyclonal gammopathy 12/30/2018   S/P total knee replacement, left 03/09/17 11/22/2017   Primary osteoarthritis of left knee 03/09/2017   History of nocturia 03/02/2017   OSA on CPAP 03/02/2016   Hypoxia 03/02/2016   Obstructive sleep apnea (adult) (pediatric) 03/02/2016   Cyst of mediastinum 01/15/2016   S/P CABG x 4 08/29/2015   Obesity, Class III, BMI 40-49.9 (morbid obesity) ([place])    Hyperglycemia    Pain in the chest 08/23/2015   Non-ST elevation MI (NSTEMI) ([place]) 08/23/2015   Diabetes mellitus, type 2 controlled with neurological complications 01/00/7121   Essential hypertension 08/23/2015   Hyperlipidemia 08/23/2015   Type 2 diabetes mellitus with hypoglycemia with coma ([place]) 08/23/2015   Myocardial infarction ([place]) 08/23/2015   Type 2 diabetes mellitus with hyperglycemia ([place]) 2016   Class 2 obesity 2016   Hyponatremia 2016   Osteoarthritis, knee 05/24/2012   Knee pain 10/29/2011   Knee stiffness 10/29/2011   S/P right knee arthroscopy 10/26/2011   Medial  meniscus, posterior horn derangement 09/07/2011   Lateral meniscus derangement 09/07/2011   OA (osteoarthritis) of knee 09/07/2011   Rotator cuff syndrome of left shoulder 08/19/2011   Right knee meniscal tear 08/19/2011   PCP:  [name], MD Pharmacy:   [facility], [place] [address] [place] [address] Phone: [phone] Fax: [place], [address] [place]  [address] Phone: [phone] Fax: [phone]     Social Determinants of Health (SDOH) Interventions    Readmission Risk Interventions    11/22/2022    8:40 AM 08/25/2021    2:25 PM 07/22/2021   11:48 AM  Readmission Risk Prevention Plan  Transportation Screening Complete Complete Complete  HRI or Home Care Consult   Complete  Social Work Consult for [place] Planning/Counseling   Complete  Palliative Care Screening   Not Applicable  Medication Review Press photographer) Complete Complete Complete  HRI or Home Care Consult Complete Complete   SW Recovery Care/Counseling Consult Complete Complete   Palliative Care Screening Not Applicable Not Applicable   Skilled Nursing Facility Not Applicable Complete

## 2022-11-25 NOTE — Evaluation (Signed)
Physical Therapy Evaluation Patient Details Name: [name] MRN: [id number] DOB: 09/16/1961 Today's Date: 11/25/2022  History of Present Illness  [name] is a 61 y.o. male with medical history significant for [name] cirrhosis with ascites, chronic kidney disease stage IIIa, obstructive sleep apnea on CPAP, type 2 diabetes with nephropathy, anemia of chronic kidney disease, diastolic heart failure and class II obesity who was just recently discharged from the hospital on 12/12 with treatment of a ascites with paracentesis after he presented for generalized weakness and increased weight gain and shortness of breath.  He had 4 L of fluid removed on 12/7 and appears to require another paracentesis and is complaining again of significant weakness and inability to ambulate at home.   Clinical Impression  Patient demonstrates slow labored movement for sitting up at bedside with c/o stomach discomfort, difficulty scooting to EOB and  limited to a few slow labored side steps and a few steps forward/backward before having to sit due to fatigue and generalized weakness.  Patient tolerated sitting up in chair after therapy - nursing staff notified.  Patient will benefit from continued skilled physical therapy in hospital and recommended venue below to increase strength, balance, endurance for safe ADLs and gait.       Recommendations for follow up therapy are one component of a multi-disciplinary discharge planning process, led by the attending physician.  Recommendations may be updated based on patient status, additional functional criteria and insurance authorization.  Follow Up Recommendations Skilled nursing-short term rehab (<3 hours/day) Can patient physically be transported by private vehicle: Yes    Assistance Recommended at Discharge Set up Supervision/Assistance  Patient can return home with the following  A lot of help with bathing/dressing/bathroom;A lot of help with walking  and/or transfers;Help with stairs or ramp for entrance;Assistance with cooking/housework    Equipment Recommendations None recommended by PT  Recommendations for Other Services       Functional Status Assessment Patient has had a recent decline in their functional status and demonstrates the ability to make significant improvements in function in a reasonable and predictable amount of time.     Precautions / Restrictions Precautions Precautions: Fall Restrictions Weight Bearing Restrictions: No      Mobility  Bed Mobility Overal bed mobility: Needs Assistance Bed Mobility: Supine to Sit     Supine to sit: Mod assist     General bed mobility comments: increased time, labored movement, had diffiuclty propping up on elbows during supine to sitting and scooting to EOB    Transfers Overall transfer level: Needs assistance Equipment used: Rolling walker (2 wheels) Transfers: Sit to/from Stand, Bed to chair/wheelchair/BSC Sit to Stand: Min assist, Mod assist   Step pivot transfers: Min assist, Mod assist       General transfer comment: diffiuclty completing sit to stands due to BLE weakness    Ambulation/Gait Ambulation/Gait assistance: Mod assist Gait Distance (Feet): 5 Feet Assistive device: Rolling walker (2 wheels) Gait Pattern/deviations: Decreased step length - right, Decreased step length - left, Decreased stride length Gait velocity: decreased     General Gait Details: limited to a few slow labored side steps and a few steps forward/backward before having to sit due to fatigue  Stairs            Wheelchair Mobility    Modified Rankin (Stroke Patients Only)       Balance Overall balance assessment: Needs assistance Sitting-balance support: Feet supported, No upper extremity supported Sitting balance-Leahy Scale: [place] Sitting  balance - Comments: fair/good seated at EOB   Standing balance support: During functional activity, Bilateral upper  extremity supported, Reliant on assistive device for balance Standing balance-Leahy Scale: Poor Standing balance comment: fair/poor using RW                             Pertinent Vitals/Pain Pain Assessment Pain Assessment: Faces Faces Pain Scale: Hurts a little bit Pain Location: abdomen Pain Descriptors / Indicators: Dull Pain Intervention(s): Limited activity within patient's tolerance, Monitored during session, Repositioned    Home Living Family/patient expects to be discharged to:: Private residence Living Arrangements: Spouse/significant other Available Help at Discharge: Family;Available 24 hours/day Type of Home: House Home Access: Stairs to enter Entrance Stairs-Rails: Left Entrance Stairs-Number of Steps: 3   Home Layout: One level Home Equipment: Conservation officer, nature (2 wheels);Cane - single point;Cane - quad      Prior Function Prior Level of Function : Needs assist       Physical Assist : Mobility (physical);ADLs (physical) Mobility (physical): Bed mobility;Transfers;Gait;Stairs   Mobility Comments: household ambulator using RW ADLs Comments: assisted by family     Hand Dominance   Dominant Hand: Right    Extremity/Trunk Assessment   Upper Extremity Assessment Upper Extremity Assessment: Generalized weakness    Lower Extremity Assessment Lower Extremity Assessment: Generalized weakness    Cervical / Trunk Assessment Cervical / Trunk Assessment: Normal  Communication   Communication: No difficulties  Cognition Arousal/Alertness: Awake/alert Behavior During Therapy: WFL for tasks assessed/performed Overall Cognitive Status: Within Functional Limits for tasks assessed                                          General Comments      Exercises     Assessment/Plan    PT Assessment Patient needs continued PT services  PT Problem List Decreased strength;Decreased activity tolerance;Decreased balance;Decreased mobility        PT Treatment Interventions DME instruction;Gait training;Stair training;Functional mobility training;Therapeutic activities;Therapeutic exercise;Patient/family education;Balance training    PT Goals (Current goals can be found in the Care Plan section)  Acute Rehab PT Goals Patient Stated Goal: return home after rehab PT Goal Formulation: With patient Time For Goal Achievement: 12/09/22 Potential to Achieve Goals: Good    Frequency Min 3X/week     Co-evaluation               AM-PAC PT "6 Clicks" Mobility  Outcome Measure Help needed turning from your back to your side while in a flat bed without using bedrails?: A Lot Help needed moving from lying on your back to sitting on the side of a flat bed without using bedrails?: A Lot Help needed moving to and from a bed to a chair (including a wheelchair)?: A Lot Help needed standing up from a chair using your arms (e.g., wheelchair or bedside chair)?: A Lot Help needed to walk in hospital room?: A Lot Help needed climbing 3-5 steps with a railing? : A Lot 6 Click Score: 12    End of Session   Activity Tolerance: Patient tolerated treatment well;Patient limited by fatigue Patient left: in chair;with call bell/phone within reach Nurse Communication: Mobility status PT Visit Diagnosis: Unsteadiness on feet (R26.81);Other abnormalities of gait and mobility (R26.89);Muscle weakness (generalized) (M62.81)    Time: 2130-8657 PT Time Calculation (min) (ACUTE ONLY): 21 min  Charges:   PT Evaluation $PT Eval Moderate Complexity: 1 Mod PT Treatments $Therapeutic Activity: 8-22 mins        12:22 PM, 11/25/22 Lonell Grandchild, MPT Physical Therapist with [facility] [phone] office [phone] mobile phone

## 2022-11-25 NOTE — Op Note (Signed)
[facility] Patient Name: [name] Procedure Date: 11/25/2022 2:36 PM MRN: [id number] Date of Birth: 1961-09-21 Attending MD: Norvel Richards , MD, [id number] CSN: [id number] Age: 61 Admit Type: Inpatient Procedure:                Upper GI endoscopy Indications:              Recent gastrointestinal bleeding Providers:                Norvel Richards, MD, Caprice Kluver, Ladoris Gene                            Technician, Technician Referring MD:              Medicines:                Propofol per Anesthesia Complications:            No immediate complications. Estimated Blood Loss:     Estimated blood loss: none. Procedure:                Pre-Anesthesia Assessment:                           - Prior to the procedure, a History and Physical                            was performed, and patient medications and                            allergies were reviewed. The patient's tolerance of                            previous anesthesia was also reviewed. The risks                            and benefits of the procedure and the sedation                            options and risks were discussed with the patient.                            All questions were answered, and informed consent                            was obtained. Prior Anticoagulants: The patient has                            taken no anticoagulant or antiplatelet agents. ASA                            Grade Assessment: III - A patient with severe                            systemic disease. After reviewing the risks and  benefits, the patient was deemed in satisfactory                            condition to undergo the procedure.                           After obtaining informed consent, the endoscope was                            passed under direct vision. Throughout the                            procedure, the patient's blood pressure, pulse, and                             oxygen saturations were monitored continuously. The                            [id number]-H190 ([id number]) scope was introduced through the                            mouth, and advanced to the second part of duodenum.                            The upper GI endoscopy was accomplished without                            difficulty. The patient tolerated the procedure                            well. Scope In: 3:11:59 PM Scope Out: 3:22:46 PM Total Procedure Duration: 0 hours 10 minutes 47 seconds  Findings:      Some blood and fresh clot at the tracheal inlet around the endotracheal       tube. 4 columns of grade 2-3 esophageal varices. No convincing bleeding       stigmata. Gastric cavity with some retained gastric contents but no       blood. Prominent changes of portal get hypertensive gastropathy. Touch       friability of the gastric mucosa. No gastric varices seen. Pylorus       patent easily traversed; examination bulb second portion revealed no       abnormalities,      Scope pulled out of the patient a Microvasive 7 shot bander loaded up       scope reintroduced into the esophagus 1 band each applied to each of the       4 columns. Good hemostasis maintained. Another look was taken at the       hypopharyngeal area there was some blood and clot but no active bleeding       around the endotracheal tube. Impression:               - Grade 2-3 esophageal varices with no convincing                            bleeding stigmata?"status post banding - for total  eradication/prophylaxis. Portal hypertensive                            gastropathy.                           -Retained gastric contents. Blood around ET tube                            likely indicative of a traumatic intubation.                            Patient may well have bled from his lower GI tract.                            May be a self-limiting phenomenon. Moderate Sedation:      Moderate  (conscious) sedation was personally administered by an       anesthesia professional. The following parameters were monitored: oxygen       saturation, heart rate, blood pressure, respiratory rate, EKG, adequacy       of pulmonary ventilation, and response to care. Recommendation:           - Return patient to ICU for observation.                           - NPO. Leave intubated overnight (particularly in                            view of retained gastric contents). Reassess                            tomorrow morning. May or may not need a                            colonoscopy. Continue ceftriaxone continue IV                            octreotide, PPI.                           -May or may not need a colonoscopy. We will                            reassess tomorrow morning. At patient request, I                            called [name], spouse, at                            [phone]?"reviewed findings and recommendations. Procedure Code(s):        --- Professional ---                           [phone], Esophagogastroduodenoscopy, flexible,  transoral; diagnostic, including collection of                            specimen(s) by brushing or washing, when performed                            (separate procedure) Diagnosis Code(s):        --- Professional ---                           K92.2, Gastrointestinal hemorrhage, unspecified CPT copyright 2022 American Medical Association. All rights reserved. The codes documented in this report are preliminary and upon coder review may  be revised to meet current compliance requirements. [name]. Christopher Dunigan, MD Norvel Richards, MD 11/25/2022 3:37:09 PM This report has been signed electronically. Number of Addenda: 0

## 2022-11-26 ENCOUNTER — Inpatient Hospital Stay (HOSPITAL_COMMUNITY): Payer: Federal, State, Local not specified - PPO

## 2022-11-26 DIAGNOSIS — K7290 Hepatic failure, unspecified without coma: Secondary | ICD-10-CM | POA: Diagnosis not present

## 2022-11-26 DIAGNOSIS — R188 Other ascites: Secondary | ICD-10-CM | POA: Diagnosis not present

## 2022-11-26 DIAGNOSIS — K7460 Unspecified cirrhosis of liver: Secondary | ICD-10-CM

## 2022-11-26 DIAGNOSIS — D508 Other iron deficiency anemias: Secondary | ICD-10-CM | POA: Diagnosis not present

## 2022-11-26 DIAGNOSIS — K922 Gastrointestinal hemorrhage, unspecified: Secondary | ICD-10-CM

## 2022-11-26 DIAGNOSIS — R531 Weakness: Secondary | ICD-10-CM | POA: Diagnosis not present

## 2022-11-26 DIAGNOSIS — J95821 Acute postprocedural respiratory failure: Secondary | ICD-10-CM

## 2022-11-26 LAB — CBC
HCT: 24.3 % — ABNORMAL LOW (ref 39.0–52.0)
Hemoglobin: 8 g/dL — ABNORMAL LOW (ref 13.0–17.0)
MCH: 31.4 pg (ref 26.0–34.0)
MCHC: 32.9 g/dL (ref 30.0–36.0)
MCV: 95.3 fL (ref 80.0–100.0)
Platelets: 112 10*3/uL — ABNORMAL LOW (ref 150–400)
RBC: 2.55 MIL/uL — ABNORMAL LOW (ref 4.22–5.81)
RDW: 16.8 % — ABNORMAL HIGH (ref 11.5–15.5)
WBC: 4 10*3/uL (ref 4.0–10.5)
nRBC: 0 % (ref 0.0–0.2)

## 2022-11-26 LAB — GLUCOSE, CAPILLARY
Glucose-Capillary: 150 mg/dL — ABNORMAL HIGH (ref 70–99)
Glucose-Capillary: 154 mg/dL — ABNORMAL HIGH (ref 70–99)
Glucose-Capillary: 155 mg/dL — ABNORMAL HIGH (ref 70–99)
Glucose-Capillary: 167 mg/dL — ABNORMAL HIGH (ref 70–99)
Glucose-Capillary: 167 mg/dL — ABNORMAL HIGH (ref 70–99)
Glucose-Capillary: 252 mg/dL — ABNORMAL HIGH (ref 70–99)

## 2022-11-26 LAB — BODY FLUID CELL COUNT WITH DIFFERENTIAL
Eos, Fluid: 0 %
Lymphs, Fluid: 87 %
Monocyte-Macrophage-Serous Fluid: 12 % — ABNORMAL LOW (ref 50–90)
Neutrophil Count, Fluid: 1 % (ref 0–25)
Total Nucleated Cell Count, Fluid: 326 cu mm (ref 0–1000)

## 2022-11-26 LAB — GRAM STAIN

## 2022-11-26 LAB — PROTIME-INR
INR: 1.6 — ABNORMAL HIGH (ref 0.8–1.2)
Prothrombin Time: 19 seconds — ABNORMAL HIGH (ref 11.4–15.2)

## 2022-11-26 LAB — TRIGLYCERIDES: Triglycerides: 68 mg/dL (ref <150)

## 2022-11-26 LAB — COMPREHENSIVE METABOLIC PANEL
ALT: 8 U/L (ref 0–44)
AST: 22 U/L (ref 15–41)
Albumin: 3.4 g/dL — ABNORMAL LOW (ref 3.5–5.0)
Alkaline Phosphatase: 60 U/L (ref 38–126)
Anion gap: 12 (ref 5–15)
BUN: 34 mg/dL — ABNORMAL HIGH (ref 8–23)
CO2: 25 mmol/L (ref 22–32)
Calcium: 8.8 mg/dL — ABNORMAL LOW (ref 8.9–10.3)
Chloride: 94 mmol/L — ABNORMAL LOW (ref 98–111)
Creatinine, Ser: 1.82 mg/dL — ABNORMAL HIGH (ref 0.61–1.24)
GFR, Estimated: 41 mL/min — ABNORMAL LOW (ref >60)
Glucose, Bld: 161 mg/dL — ABNORMAL HIGH (ref 70–99)
Potassium: 3.7 mmol/L (ref 3.5–5.1)
Sodium: 131 mmol/L — ABNORMAL LOW (ref 135–145)
Total Bilirubin: 2.7 mg/dL — ABNORMAL HIGH (ref 0.3–1.2)
Total Protein: 6.9 g/dL (ref 6.5–8.1)

## 2022-11-26 LAB — AMMONIA: Ammonia: 58 umol/L — ABNORMAL HIGH (ref 9–35)

## 2022-11-26 LAB — MAGNESIUM: Magnesium: 1.9 mg/dL (ref 1.7–2.4)

## 2022-11-26 MED ORDER — ALBUMIN HUMAN 25 % IV SOLN
12.5000 g | Freq: Once | INTRAVENOUS | Status: AC
  Administered 2022-11-26: 12.5 g via INTRAVENOUS
  Filled 2022-11-26: qty 50

## 2022-11-26 MED ORDER — OCTREOTIDE ACETATE 500 MCG/ML IJ SOLN
INTRAMUSCULAR | Status: AC
  Filled 2022-11-26: qty 1

## 2022-11-26 MED ORDER — ORAL CARE MOUTH RINSE: 15.0000 mL | OROMUCOSAL | Status: DC | PRN

## 2022-11-26 MED ORDER — ORAL CARE MOUTH RINSE
15.0000 mL | OROMUCOSAL | Status: DC
  Administered 2022-11-26 – 2022-11-30 (×17): 15 mL via OROMUCOSAL

## 2022-11-26 NOTE — Progress Notes (Signed)
Patient extubated at 0916 to [place] per MD order. No complications noted.

## 2022-11-26 NOTE — Progress Notes (Signed)
Transported to [place] per stretcher no acute distress. On 2L , sat 99. Paracentesis explained, consent obtained. Prepped draped in sterile manner. Access gained at right abdomen. 4L retrieved during procedure, tolerated well. Sats have remained at 99 . Access removed, bandaged, no hematoma, no leakage.

## 2022-11-26 NOTE — Progress Notes (Signed)
O2 decreased from 50% to 40% spo2 100%

## 2022-11-26 NOTE — Consult Note (Signed)
NAME:  [name], MRN:  [id number], DOB:  12/09/1961, LOS: 1 ADMISSION DATE:  11/24/2022, CONSULTATION DATE:  11/26/2022  REFERRING MD:  Lanice Shirts, CHIEF COMPLAINT: Respiratory failure  History of Present Illness:  61 year old man with known cirrhosis and ascites presented back to the ED on 12/14 2 days after discharge with profound weakness and unable to ambulate.  Hemoglobin was 9.9, BUN/creatinine was 31/1.4. He was noted to have maroon stools He was recently discharged 12/12 after admission for ascites and underwent paracentesis with removal of 4 L of fluid. Prior to that, he was hospitalized in November and required paracentesis again. He is maintained on midodrine for chronic low blood pressure and also on nadolol 20 mg daily. He has been evaluated in the [facility] 03/2022, felt that it was too early for transplantation. 12/14 underwent EGD, 4 columns of varices noted which were banded, no active bleeding was noted.  Some bleeding noted at the tracheal inlet around the endotracheal tube and felt that this may be indicative of traumatic intubation He was left intubated and transferred to the ICU   Pertinent  Medical History  HFpEF , status post CABG CKD stage IIIa OSA Cirrhosis with ascites-NASH Type 2 diabetes with nephropathy Adrenal insufficiency Polyclonal gammopathy  Significant Hospital Events: Including procedures, antibiotic start and stop dates in addition to other pertinent events     Interim History / Subjective:  Critically ill, intubated Sedated on propofol  Objective   Blood pressure 121/66, pulse 61, temperature (!) 97.3 F (36.3 C), temperature source Axillary, resp. rate (!) 24, height 5' 10.5" (1.791 m), weight 111.5 kg, SpO2 99 %.    Vent Mode: PRVC FiO2 (%):  [40 %-100 %] 40 % Set Rate:  [16 bmp-20 bmp] 16 bmp Vt Set:  [560 mL] 560 mL PEEP:  [5 cmH20] 5 cmH20 Plateau Pressure:  [19 cmH20-24 cmH20] 24 cmH20   Intake/Output  Summary (Last 24 hours) at 11/26/2022 1307 Last data filed at 11/26/2022 3557 Gross per 24 hour  Intake 1211.61 ml  Output 950 ml  Net 261.61 ml   Filed Weights   11/24/22 2329 11/26/22 0604  Weight: 114.4 kg 111.5 kg    Examination: General: Acutely ill, obese man, intubated and sedated with propofol, no distress HENT: Mild pallor, no icterus, no JVD Lungs: Decreased breath sounds bilateral, no accessory muscle use Cardiovascular: S1-S2 regular, no rub Abdomen: Soft, distended, fluid thrill present Extremities: No deformity, 1+ edema Neuro: Sedate, RASS -3  Labs show hyponatremia, slight increase in BUN/creatinine to 34/1.8, normal LFTs, ammonia 58, bilirubin 2.7, hemoglobin 8.0, INR 1.6  Chest x-ray independently reviewed shows low lung volumes and mild pulmonary vascular congestion  Resolved Hospital Problem list     Assessment & Plan:  Postprocedure acute respiratory failure related to traumatic intubation, distended abdomen and complicated procedure. -We proceeded with spontaneous breathing trial, tidal volumes were low at 300 range, he was extubated to BiPAP and can be weaned off BiPAP. -Continue using CPAP during sleep given his history of OSA.  Cirrhosis with ascites -paracentesis is planned , may need albumin infusion EGD showed esophageal varices which were banded -Octreotide infusion per GI, ceftriaxone for SBP prophylaxis  Chronic hypotension -maintained on midodrine. Okay to stop nadolol per [facility]  AKI -trend BMET    Best Practice (right click and "Reselect all SmartList Selections" daily)    Code Status:  full code Last date of multidisciplinary goals of care discussion [NA]  Labs  CBC: Recent Labs  Lab 11/21/22 0129 11/24/22 0746 11/25/22 0312 11/25/22 0839 11/26/22 0439  WBC 4.6 4.1 3.3*  --  4.0  NEUTROABS  --  2.5  --   --   --   HGB 9.6* 9.9* 7.9* 7.6* 8.0*  HCT 28.6* 29.4* 23.7* 23.0* 24.3*  MCV 95.0 95.5 94.8  --  95.3   PLT 125* 142* 107*  --  112*    Basic Metabolic Panel: Recent Labs  Lab 11/22/22 0348 11/23/22 0329 11/24/22 0746 11/25/22 0312 11/26/22 0439  NA 131* 132* 129* 129* 131*  K 4.3 4.4 5.4* 4.2 3.7  CL 97* 98 94* 93* 94*  CO2 _0 GLUCOSE 166* 189* 277* 190* 161*  BUN 25* 27* 31* 30* 34*  CREATININE 1.34* 1.41* 1.42* 1.54* 1.82*  CALCIUM 8.2* 8.7* 8.5* 9.0 8.8*  MG  --   --   --  2.1 1.9   GFR: Estimated Creatinine Clearance: 52.4 mL/min (A) (by C-G formula based on SCr of 1.82 mg/dL (H)). Recent Labs  Lab 11/21/22 0129 11/24/22 0746 11/25/22 0312 11/26/22 0439  WBC 4.6 4.1 3.3* 4.0    Liver Function Tests: Recent Labs  Lab 11/24/22 0746 11/25/22 0312 11/26/22 0439  AST 59* 22 22  ALT _1 ALKPHOS 85 61 60  BILITOT 3.1* 2.9* 2.7*  PROT 7.6 7.3 6.9  ALBUMIN 2.9* 3.6 3.4*   No results for input(s): "LIPASE", "AMYLASE" in the last 168 hours. Recent Labs  Lab 11/25/22 0312 11/26/22 0439  AMMONIA 64* 58*    ABG    Component Value Date/Time   PHART 7.56 (H) 11/25/2022 1638   PCO2ART 36 11/25/2022 1638   PO2ART 422 (H) 11/25/2022 1638   HCO3 32.2 (H) 11/25/2022 1638   TCO2 22 08/30/2015 1710   ACIDBASEDEF 6.0 (H) 03/17/2021 0607   O2SAT 99.9 11/25/2022 1638     Coagulation Profile: Recent Labs  Lab 11/25/22 0312 11/26/22 0439  INR 1.4* 1.6*    Cardiac Enzymes: No results for input(s): "CKTOTAL", "CKMB", "CKMBINDEX", "TROPONINI" in the last 168 hours.  HbA1C: Hgb A1c MFr Bld  Date/Time Value Ref Range Status  10/21/2022 11:30 AM 5.6 4.8 - 5.6 % Final    Comment:    (NOTE) Pre diabetes:          5.7%-6.4%  Diabetes:              >6.4%  Glycemic control for   <7.0% adults with diabetes   07/22/2021 06:23 AM 6.7 (H) 4.8 - 5.6 % Final    Comment:    (NOTE) Pre diabetes:          5.7%-6.4%  Diabetes:              >6.4%  Glycemic control for   <7.0% adults with diabetes     CBG: Recent Labs  Lab 11/25/22 2016  11/26/22 0003 11/26/22 0529 11/26/22 0711 11/26/22 1246  GLUCAP 147* 155* 167* 167* 150*    Review of Systems:   Unable to obtain since intubated  Past Medical History:  He,  has a past medical history of Anemia, Arthritis, CAD (coronary artery disease), Cirrhosis ([place]), CKD (chronic kidney disease) stage 3, GFR 30-59 ml/min ([place]), Diastolic CHF ([place]), Essential hypertension, Hyperlipidemia, Iron deficiency anemia (07/15/2021), OSA on CPAP, Polyclonal gammopathy, Thrombocytopenia ([place]) (2016), and Type 2 diabetes mellitus ([place]).   Surgical History:   Past Surgical History:  Procedure Laterality Date   APPENDECTOMY  Biceps tendon surgery Right    BIOPSY  11/22/2019   Procedure: BIOPSY;  Surgeon: Daneil Dolin, MD;  [facility];  Service: Endoscopy;;   CARDIAC CATHETERIZATION N/A 08/25/2015   Procedure: Left Heart Cath and Coronary Angiography;  Surgeon: Belva Crome, MD;  Location: [facility];  Service: Cardiovascular;  Laterality: N/A;   CATARACT EXTRACTION Left 08/18/2022   COLONOSCOPY WITH PROPOFOL N/A 11/22/2019   Procedure: COLONOSCOPY WITH PROPOFOL;  Surgeon: Daneil Dolin, MD; Four 4-5 mm polyps, findings suggestive of portal colopathy, congested appearing colonic mucosa diffusely, rectal varices, and adequate right colon prep.  Pathology with tubular adenomas and hyperplastic polyp.  Right colon biopsy with focal active colitis.  Recommendations to repeat colonoscopy in 3 months due to poor prep.   COLONOSCOPY WITH PROPOFOL N/A 03/17/2020   Procedure: COLONOSCOPY WITH PROPOFOL;  Surgeon: Daneil Dolin, MD;  [facility];  Service: Endoscopy;  Laterality: N/A;  8:45am - pt does not need covid test, was + 2/4 <90 days   CORONARY ARTERY BYPASS GRAFT N/A 08/29/2015   Procedure: CORONARY ARTERY BYPASS GRAFTING (CABG);  Surgeon: [name], MD;  Location: [facility];  Service: Open Heart Surgery;  Laterality: N/A;   ESOPHAGEAL BANDING N/A  07/23/2021   Procedure: ESOPHAGEAL BANDING;  Surgeon: Eloise Harman, DO;  [facility];  Service: Endoscopy;  Laterality: N/A;   ESOPHAGOGASTRODUODENOSCOPY (EGD) WITH PROPOFOL N/A 11/22/2019   Procedure: ESOPHAGOGASTRODUODENOSCOPY (EGD) WITH PROPOFOL;  Surgeon: Daneil Dolin, MD; 4 columns of grade 2-3 esophageal varices, portal gastropathy, gastric polyp/abnormal gastric mucosa s/p biopsy.  Pathology with hyperplastic polyp, mild chronic gastritis, negative H. pylori.   ESOPHAGOGASTRODUODENOSCOPY (EGD) WITH PROPOFOL N/A 07/23/2021   Grade 2 esophageal varices without stigmata of bleeding, completely eradicated with banding, portal gastropathy, GAVE without bleeding.   KNEE ARTHROSCOPY Left    TEE WITHOUT CARDIOVERSION N/A 08/29/2015   Procedure: TRANSESOPHAGEAL ECHOCARDIOGRAM (TEE);  Surgeon: [name], MD;  Location: [facility];  Service: Open Heart Surgery;  Laterality: N/A;   TOTAL KNEE ARTHROPLASTY Left 03/09/2017   Procedure: TOTAL KNEE ARTHROPLASTY;  Surgeon: Carole Civil, MD;  Location: [facility];  Service: Orthopedics;  Laterality: Left;     Social History:   reports that he has never smoked. He has never used smokeless tobacco. He reports that he does not currently use alcohol. He reports that he does not use drugs.   Family History:  His family history includes ALS in his mother; Aneurysm in his maternal grandmother; Arthritis in an other family member; CAD in his brother and father; Cancer in his maternal grandfather and another family member; Diabetes in an other family member; Diabetes Mellitus II in his brother, brother, father, maternal grandmother, and sister; Hodgkin's lymphoma in his father; Liver cancer in his father. There is no history of Anesthesia problems, Hypotension, Malignant hyperthermia, Pseudochol deficiency, or Colon cancer.   Allergies Allergies  Allergen Reactions   Fluticasone Rash     Home Medications  Prior to Admission  medications   Medication Sig Start Date End Date Taking? Authorizing Provider  acetaminophen (TYLENOL) 325 MG tablet Take 650 mg by mouth every 6 (six) hours as needed for mild pain. 12/19/20  Yes [provider]  albuterol (VENTOLIN HFA) 108 (90 Base) MCG/ACT inhaler Inhale 2 puffs into the lungs every 6 (six) hours as needed for wheezing or shortness of breath. 12/22/20  Yes Gerlene Fee, NP  ALPRAZolam Duanne Moron) 0.25 MG  tablet Take 0.25 mg by mouth daily. 02/20/21  Yes [provider]  Armodafinil 50 MG tablet Take 1 tablet (50 mg total) by mouth as needed. 06/03/22  Yes Dohmeier, Asencion Partridge, MD  aspirin EC 81 MG tablet Take 1 tablet (81 mg total) by mouth every evening. 10/23/22  Yes Johnson, Clanford L, MD  Cholecalciferol (DIALYVITE VITAMIN D 5000) 125 MCG (5000 UT) capsule Take 5,000 Units by mouth in the morning.    Yes [provider]  insulin aspart (NOVOLOG) 100 UNIT/ML injection Inject 5 Units into the skin 3 (three) times daily before meals. Special Instructions: If accu-check is greater than 150. Hold for accu-check 150 or below. With Meals 12/22/20  Yes Gerlene Fee, NP  insulin degludec (TRESIBA) 100 UNIT/ML FlexTouch Pen Inject 52 Units into the skin daily. 11/07/22 02/05/23 Yes [provider]  lactulose (CHRONULAC) 10 GM/15ML solution Take 30 mLs (20 g total) by mouth daily. 12/22/20  Yes Gerlene Fee, NP  loratadine (CLARITIN) 10 MG tablet Take 10 mg by mouth in the morning.    Yes [provider]  metolazone (ZAROXOLYN) 2.5 MG tablet Take 1 tablet (2.5 mg total) by mouth once a week. Every Wedenesday 11/23/22  Yes Barton Dubois, MD  midodrine (PROAMATINE) 10 MG tablet Take 1 tablet (10 mg total) by mouth 3 (three) times daily with meals. 08/25/21  Yes Emokpae, Courage, MD  nadolol (CORGARD) 20 MG tablet Take 1 tablet (20 mg total) by mouth daily. 06/07/22  Yes Imogene Burn, PA-C  oxyCODONE (OXY IR/ROXICODONE) 5 MG immediate release  tablet Take 5 mg by mouth every 4 (four) hours as needed for moderate pain. 11/11/22  Yes [provider]  potassium chloride SA (KLOR-CON M) 20 MEQ tablet TAKE 1 TABLET(20 MEQ) BY MOUTH DAILY 09/27/22  Yes Satira Sark, MD  pravastatin (PRAVACHOL) 20 MG tablet TAKE 1 TABLET(20 MG) BY MOUTH AT BEDTIME 09/08/22  Yes Strader, [place] M, PA-C  spironolactone (ALDACTONE) 100 MG tablet Take 1 tablet (100 mg total) by mouth daily. 10/23/22  Yes Johnson, Clanford L, MD  torsemide (DEMADEX) 20 MG tablet Take 3 tablets (60 mg total) by mouth 2 (two) times daily. 11/08/22 11/03/23 Yes Satira Sark, MD  venlafaxine XR (EFFEXOR-XR) 75 MG 24 hr capsule Take 1 capsule (75 mg total) by mouth daily with breakfast. 08/25/21  Yes Emokpae, Courage, MD  Continuous Blood Gluc Receiver (DEXCOM G5 RECEIVER KIT) DEVI Use to check/monitor sugar. 11/23/22   Barton Dubois, MD  Continuous Blood Gluc Sensor (FREESTYLE LIBRE 14 DAY SENSOR) MISC SMARTSIG:1 Each Topical Every 2 Weeks 10/03/21   [provider]  nitroGLYCERIN (NITROSTAT) 0.4 MG SL tablet Place 1 tablet (0.4 mg total) under the tongue every 5 (five) minutes as needed for chest pain. Do not exceed 3 doses in 15 mins 11/04/21 09/08/22  Hilty, Nadean Corwin, MD  pantoprazole (PROTONIX) 40 MG tablet Take 1 tablet (40 mg total) by mouth daily. 08/25/21 09/08/22  Roxan Hockey, MD     Critical care time: 20m      RKara MeadMD. FAustin Gi [facility] [place] [facility] Pager : [phone]  If no response to pager , please call [phone] until 7 pm After 7:00 pm call Elink  [phone]  11/26/2022

## 2022-11-26 NOTE — Procedures (Signed)
PreOperative Dx: Cirrhosis due to NASH, ascites Postoperative Dx: Cirrhosis due to NASH, ascites Procedure:   US guided paracentesis Radiologist:  Thornton Papas Anesthesia:  10 ml of1% lidocaine Specimen:  4 L of dark amber ascitic fluid EBL:   < 1 ml Complications:  None

## 2022-11-26 NOTE — Progress Notes (Signed)
Swallow screen done earlier on patient with no issues noted. Patient tolerated thin liquid well and swallowed PO medication one at a time with NO issues (no cough or clearing of throat noted). Patient tolerated clear liquid diet at dinnertime well. Verified with Dr Manuella Ghazi about 14 units of novolog at meal time due to it was ordered prior to ventilation and on previous diet. Notified Dr Manuella Ghazi that patient got the 2 units sliding scale for the CBG result of 154 and per verbal from Dr Manuella Ghazi, give only 7 units of meal time coverage at this time. Dr Manuella Ghazi aware that patient drank everything on his dinner tray and tolerated well thus far.

## 2022-11-26 NOTE — Progress Notes (Signed)
Physical Therapy Treatment Patient Details Name: [name] MRN: [id number] DOB: 1961-06-15 Today's Date: 11/26/2022   History of Present Illness [name] is a 61 y.o. male with medical history significant for [name] cirrhosis with ascites, chronic kidney disease stage IIIa, obstructive sleep apnea on CPAP, type 2 diabetes with nephropathy, anemia of chronic kidney disease, diastolic heart failure and class II obesity who was just recently discharged from the hospital on 12/12 with treatment of a ascites with paracentesis after he presented for generalized weakness and increased weight gain and shortness of breath.  He had 4 L of fluid removed on 12/7 and appears to require another paracentesis and is complaining again of significant weakness and inability to ambulate at home.    PT Comments    REASSESSMENT: Patient s/p transfer to ICU due to having to be intubated and put on BiPAP and cleared by MD to resume therapy.  Patient demonstrates slow labored movement for sitting up at bedside, has difficulty completing sit to stands due to BLE weakness, right foot sliding forward resulting in loss of balance and limited to a few slow side steps before having to sit at bedside due to legs giving out.  Patient unsafe to transfer to chair due to weakness, mild lethargy and put back to bed.  Patient will benefit from continued skilled physical therapy in hospital and recommended venue below to increase strength, balance, endurance for safe ADLs and gait.    Recommendations for follow up therapy are one component of a multi-disciplinary discharge planning process, led by the attending physician.  Recommendations may be updated based on patient status, additional functional criteria and insurance authorization.  Follow Up Recommendations  Skilled nursing-short term rehab (<3 hours/day) Can patient physically be transported by private vehicle: No   Assistance Recommended at Discharge Set up  Supervision/Assistance  Patient can return home with the following A lot of help with bathing/dressing/bathroom;A lot of help with walking and/or transfers;Help with stairs or ramp for entrance;Assistance with cooking/housework   Equipment Recommendations  None recommended by PT    Recommendations for Other Services       Precautions / Restrictions Precautions Precautions: Fall Restrictions Weight Bearing Restrictions: No     Mobility  Bed Mobility Overal bed mobility: Needs Assistance Bed Mobility: Rolling, Sidelying to Sit, Sit to Sidelying Rolling: Min assist Sidelying to sit: Mod assist     Sit to sidelying: Mod assist General bed mobility comments: slow labored movement, discomfort in abdomen    Transfers Overall transfer level: Needs assistance Equipment used: Rolling walker (2 wheels) Transfers: Sit to/from Stand Sit to Stand: Mod assist, Max assist           General transfer comment: required repeated attmepts for completing sit to stands due to BLE weakness right worse than left with right foot frequently sliding forward when attempting to take steps forward    Ambulation/Gait Ambulation/Gait assistance: Mod assist, Max assist Gait Distance (Feet): 4 Feet Assistive device: Rolling walker (2 wheels) Gait Pattern/deviations: Decreased step length - right, Decreased step length - left, Decreased stride length Gait velocity: decreased     General Gait Details: limited to a few slow labored side steps due to BLE weakness with RLE frequent sliding forward resulting in loss of balance   Stairs             Wheelchair Mobility    Modified Rankin (Stroke Patients Only)       Balance Overall balance assessment: Needs assistance Sitting-balance support:  Feet supported, No upper extremity supported Sitting balance-Leahy Scale: Fair Sitting balance - Comments: seated at EOB   Standing balance support: During functional activity, Reliant on assistive  device for balance, Bilateral upper extremity supported Standing balance-Leahy Scale: Poor Standing balance comment: using RW                            Cognition Arousal/Alertness: Awake/alert Behavior During Therapy: WFL for tasks assessed/performed Overall Cognitive Status: Within Functional Limits for tasks assessed                                          Exercises      General Comments        Pertinent Vitals/Pain Pain Assessment Pain Assessment: Faces Faces Pain Scale: Hurts a little bit Pain Location: abdomen Pain Descriptors / Indicators: Sore, Grimacing Pain Intervention(s): Limited activity within patient's tolerance, Monitored during session, Repositioned    Home Living                          Prior Function            PT Goals (current goals can now be found in the care plan section) Acute Rehab PT Goals Patient Stated Goal: return home after rehab PT Goal Formulation: With patient Time For Goal Achievement: 12/09/22 Potential to Achieve Goals: Good Progress towards PT goals: Progressing toward goals    Frequency    Min 3X/week      PT Plan Current plan remains appropriate    Co-evaluation              AM-PAC PT "6 Clicks" Mobility   Outcome Measure  Help needed turning from your back to your side while in a flat bed without using bedrails?: A Lot Help needed moving from lying on your back to sitting on the side of a flat bed without using bedrails?: A Lot Help needed moving to and from a bed to a chair (including a wheelchair)?: A Lot Help needed standing up from a chair using your arms (e.g., wheelchair or bedside chair)?: A Lot Help needed to walk in hospital room?: A Lot Help needed climbing 3-5 steps with a railing? : Total 6 Click Score: 11    End of Session   Activity Tolerance: Patient tolerated treatment well;Patient limited by fatigue Patient left: in bed;with call bell/phone within  reach Nurse Communication: Mobility status PT Visit Diagnosis: Unsteadiness on feet (R26.81);Other abnormalities of gait and mobility (R26.89);Muscle weakness (generalized) (M62.81)     Time: 0092-3300 PT Time Calculation (min) (ACUTE ONLY): 27 min  Charges:  $Therapeutic Activity: 23-37 mins                     2:05 PM, 11/26/22 Lonell Grandchild, MPT Physical Therapist with [facility] [phone] office [phone] mobile phone

## 2022-11-26 NOTE — Progress Notes (Signed)
Gastroenterology Progress Note   Referring Provider: No ref. provider found Primary Care Physician:  [name], MD Primary Gastroenterologist:  Dr. Gala Romney  Patient ID: [name]; [id number]; March 03, 1961    Subjective   Patient undergoing extubation while entering room. Weak voice. Placed on Bipap for O2 therapy per critical care attending. Nursing to attempt bedside swallow after bipap removed. Patient reports tenderness to lower abdomen. Nursing states abdomen appears distended.   Objective   Vital signs in last 24 hours Temp:  [97.3 F (36.3 C)-98.3 F (36.8 C)] 97.3 F (36.3 C) (12/15 0531) Pulse Rate:  [47-76] 64 (12/15 0916) Resp:  [14-23] 22 (12/15 0916) BP: (90-145)/(46-82) 104/46 (12/15 0900) SpO2:  [99 %-100 %] 99 % (12/15 0916) FiO2 (%):  [40 %-100 %] 40 % (12/15 0746) Weight:  [111.5 kg] 111.5 kg (12/15 0604) Last BM Date : 11/25/22  Physical Exam General:  Drowsy, ill appearing.  Head:  Normocephalic and atraumatic. Eyes:  No icterus, sclera clear. Conjuctiva pink.  Mouth:  Dry.  Heart:  S1, S2 present, no murmurs noted.  Lungs: Clear to auscultation bilaterally, without wheezing, rales, or rhonchi.  Abdomen:  Bowel sounds present, tense, distended. Ttp to lower abdomen. No HSM or hernias noted. No rebound or guarding. No masses appreciated  Msk:  Symmetrical without gross deformities. Normal posture. Extremities:  1+ pitting edema, venous statis.  Neurologic:  Drowsy, oriented x3;  grossly normal neurologically. Skin:  Warm and dry, intact without significant lesions. No jaundice.  Psych:  drowsy and cooperative.  Intake/Output from previous day: 12/14 0701 - 12/15 0700 In: 1179.9 [I.V.:887.8; IV Piggyback:292.1] Out: 7681 [Urine:1750] Intake/Output this shift: Total I/O In: 228 [I.V.:228] Out: -   Lab Results  Recent Labs    11/24/22 0746 11/25/22 0312 11/25/22 0839 11/26/22 0439  WBC 4.1 3.3*  --  4.0  HGB 9.9* 7.9* 7.6* 8.0*   HCT 29.4* 23.7* 23.0* 24.3*  PLT 142* 107*  --  112*   BMET Recent Labs    11/24/22 0746 11/25/22 0312 11/26/22 0439  NA 129* 129* 131*  K 5.4* 4.2 3.7  CL 94* 93* 94*  CO2 25 27 25   GLUCOSE 277* 190* 161*  BUN 31* 30* 34*  CREATININE 1.42* 1.54* 1.82*  CALCIUM 8.5* 9.0 8.8*   LFT Recent Labs    11/24/22 0746 11/25/22 0312 11/26/22 0439  PROT 7.6 7.3 6.9  ALBUMIN 2.9* 3.6 3.4*  AST 59* 22 22  ALT 14 9 8   ALKPHOS 85 61 60  BILITOT 3.1* 2.9* 2.7*   PT/INR Recent Labs    11/25/22 0312 11/26/22 0439  LABPROT 16.8* 19.0*  INR 1.4* 1.6*   Hepatitis Panel No results for input(s): "HEPBSAG", "HCVAB", "HEPAIGM", "HEPBIGM" in the last 72 hours.  Studies/Results DG CHEST PORT 1 VIEW  Result Date: 11/26/2022 CLINICAL DATA:  Acute respiratory failure. EXAM: PORTABLE CHEST 1 VIEW COMPARISON:  One-view chest x-ray 11/24/2022 FINDINGS: Heart size is exaggerated by low lung volumes. Progressive pulmonary vascular congestion and mild edema is now present. No definite effusions are present. Chronic elevation of the right hemidiaphragm noted. Visualized soft tissues and bony thorax are otherwise stable. IMPRESSION: Progressive pulmonary vascular congestion and mild edema. Electronically Signed   By: San Morelle M.D.   On: 11/26/2022 09:40   DG Chest Port 1 View  Result Date: 11/24/2022 CLINICAL DATA:  Unable to get up, week EXAM: PORTABLE CHEST 1 VIEW COMPARISON:  10/18/2021 FINDINGS: Mild bilateral interstitial thickening. No focal consolidation. No  pleural effusion or pneumothorax. Heart and mediastinal contours are unremarkable. Prior CABG. No acute osseous abnormality. IMPRESSION: 1. Mild pulmonary vascular congestion. Electronically Signed   By: Kathreen Devoid M.D.   On: 11/24/2022 09:33   US Paracentesis  Result Date: 11/18/2022 INDICATION: Recurrent ascites; NASH Cirrhosis EXAM: ULTRASOUND GUIDED RLQ PARACENTESIS MEDICATIONS: 10 cc 1% lidocaine. COMPLICATIONS: None  immediate. PROCEDURE: Informed written consent was obtained from the patient after a discussion of the risks, benefits and alternatives to treatment. A timeout was performed prior to the initiation of the procedure. Initial ultrasound scanning demonstrates a large amount of ascites within the right lower abdominal quadrant. The right lower abdomen was prepped and draped in the usual sterile fashion. 1% lidocaine was used for local anesthesia. Following this, a 7 cm Yueh catheter was introduced. An ultrasound image was saved for documentation purposes. The paracentesis was performed. The catheter was removed and a dressing was applied. The patient tolerated the procedure well without immediate post procedural complication. Patient received post-procedure intravenous albumin; see nursing notes for details. FINDINGS: A total of approximately 4 liters of dark yellow fluid was removed. Samples were sent to the laboratory as requested by the clinical team. IMPRESSION: Successful ultrasound-guided paracentesis yielding 4 liters of peritoneal fluid. PLAN: The patient has required >/=2 paracenteses in a 30 day period. He is currently being followed by the [facility]. IR is available for consideration of TIPS if local care is desired. Read by Lavonia Drafts [facility] Electronically Signed   By: Ruthann Cancer M.D.   On: 11/18/2022 13:10   DG Chest Port 1 View  Result Date: 11/18/2022 CLINICAL DATA:  Weakness, history of fall 2 weeks ago. EXAM: PORTABLE CHEST 1 VIEW COMPARISON:  10/28/2022. FINDINGS: The heart size and mediastinal contours are stable. There is atherosclerotic calcification of the aorta. Lung volumes are low with mild atelectasis at the lung bases. No definite effusion or pneumothorax. Sternotomy wires are noted. No acute osseous abnormality. IMPRESSION: Low lung volumes with atelectasis at the lung bases. Electronically Signed   By: Brett Fairy M.D.   On: 11/18/2022 03:45    Assessment  61 y.o. male  with a history of [name] cirrhosis (recent decompensation) followed at [facility] liver clinic, HTN, OSA, type 2 diabetes, CAD s/p CABG, HFpEF, mild aortic stenosis, adrenal insufficiency (diagnosed April 2022 placed on hydrocortisone and midodrine but no longer on hydrocortisone), chronic kidney disease, polyclonal gammopathy presenting for his third hospitalization in the past month for ongoing weakness.  GI consult is admission due to GI bleeding.  GI bleed: On admission patient denied any bleeding prior to yesterday.  Nursing staff previously reported maroon-colored stool x 2.  Had decline in his hemoglobin from 9.9-7.6 yesterday, has improved slightly to 8 today.  Noted to have grade 2 esophageal varices with no recent bleeding s/p banding and GAVE without bleeding in August 2022.  There were plans for EGD in July 2023 but never followed through.  Was scheduled for EGD in February with [facility].  Last colonoscopy in April 2021 without good visibility of the colon, did have presence of rectal varices.  4 polyps noted on colonoscopy in 2020 for which she had portal colopathy, rectal varices, and pathology showing focal active colitis.  Plans for colonoscopy at [facility] given to failed attempts with poor prep.  He underwent EGD yesterday 11/25/2022 with 4 columns of grade 2/3 varices with no stigmata of bleeding s/p banding x 4, portal hypertensive gastropathy, retained gastric contents.  Patient was  left intubated for safety, extubated this morning.  Nursing reported no signs of any GI bleeding overnight.  Will continue octreotide, PPI twice daily for now.  Once respiratory status improved and swallow study passed, may start clear liquid diet.  If recurrent bleeding or further drop in hemoglobin, may consider inpatient colonoscopy.  Cirrhosis: Recent decompensation.  Has had 3 hospitalizations for anasarca.  Has had 2 paracenteses in the last 30 days.  MELD sodium on admission 24. (April MELD was 10, last month 14).  Follows  with [facility] liver clinic.  Had liver ultrasound with Doppler last admission with no thrombosis.  Previously on nadolol.  Recommend discontinuing given hypotension.  Abdomen remains distended and tense.  Patient has some tenderness on exam today.  Will order ultrasound paracentesis with labs for therapeutic purposes and remove no more than 4 L.  Will receive 1 bottle of albumin with paracentesis.  Will continue Rocephin 1 g daily for SBP prophylaxis given GI bleed. Given current MELD score and multiple co morbidities, likely would not meet criteria for TIPS at this point. Will need follow up with [facility] post hospitalization.   Plan / Recommendations  Continue octreotide 50 mcg/hr  Continue Rocephin 1g daily for 5 days Continue PPI BID Monitor for overt GI bleeding, may consider colonoscopy if further drop in hemoglobin or recurrent bleeding.  U/S paracentesis with labs, remove no more than 4L Albumin 25% 12.5g with paracentesis. Continue to hold diuretics for now given acute on chronic kidney disease.  Keep NPO, Clear liquid diet after paracentesis.     LOS: 1 day    11/26/2022, 10:01 AM   Venetia Night, MSN, FNP-BC, AGACNP-BC [facility]

## 2022-11-26 NOTE — Progress Notes (Signed)
PT Cancellation Note  Patient Details Name: [name] MRN: [id number] DOB: Aug 25, 1961   Cancelled Treatment:    Reason Eval/Treat Not Completed: Medical issues which prohibited therapy.  Patient transferred to a higher level of care and will need new PT consult to resume therapy when patient is medically stable.  Thank you.    7:54 AM, 11/26/22 Lonell Grandchild, MPT Physical Therapist with [facility] [phone] office [phone] mobile phone

## 2022-11-26 NOTE — Progress Notes (Addendum)
PROGRESS NOTE    [name] DOB: 1961/09/15 DOA: 11/24/2022 PCP: [name], MD   Brief Narrative:    [name] is a 61 y.o. male with medical history significant for [name] cirrhosis with ascites, chronic kidney disease stage IIIa, obstructive sleep apnea on CPAP, type 2 diabetes with nephropathy, anemia of chronic kidney disease, diastolic heart failure and class II obesity who was just recently discharged from the hospital on 12/12 with treatment of a ascites with paracentesis after he presented for generalized weakness and increased weight gain and shortness of breath.  He had 4 L of fluid removed on 12/7 and appears to require another paracentesis and is complaining again of significant weakness and inability to ambulate at home.   He was noted to have GI bleeding with worsening anemia on 12/14 and underwent EGD with esophageal varices noted and had some banding.  No stigmata of bleeding was noted, however he returned intubated and on ventilator.  He was subsequently extubated on 12/15 and did undergo 4 L paracentesis as well.  Assessment & Plan:   Principal Problem:   Weakness Active Problems:   Essential hypertension   Hyperglycemia   S/P CABG x 4   OSA on CPAP   NASH cirrhosis of liver (HCC)   Anasarca   Class 2 obesity   Hyponatremia   CKD stage 3 due to type 2 diabetes mellitus (HCC)   Anemia   GI bleed   CAD (coronary artery disease)   HLD (hyperlipidemia)   Chronic idiopathic thrombocytopenia (HCC)   Decompensated hepatic cirrhosis (HCC)   Ascites  Assessment and Plan:  Acute blood loss anemia secondary to GI bleed -Status post EGD with esophageal varices and banding performed -Now extubated and started on clear liquid diet -Rocephin 1 g daily for 5 days -Octreotide infusion -PPI twice daily -Monitor CBC   Generalized weakness in the setting of recurrent ascites due to NASH cirrhosis-decompensated cirrhosis - plan to give  albumin for now and await paracentesis once abdomen is more tense and plan to do 4 L paracentesis as needed -PT recommending SNF -4 L paracentesis performed on 12/15   Mild hyponatremia-stable -Likely due to volume overload -Monitor closely   Chronic diastolic CHF -No indication for exacerbation at this time, monitor   Stage II pressure injury to sacrum -Reposition and keep dry -Wound care   Dyslipidemia -continue statin and monitor LFTs   Type 2 diabetes with hyperglycemia -semglee 36 units daily -Novolog 14 units TID -SSI 4 hours and holding diet for now -Appreciate diabetes coordinator   AKI on CKD stage IIIa -Continue to monitor -Avoid nephrotoxic agents -Started on clear liquid diet, but likely may require some more albumin infusions after paracentesis 12/15 -Holding diuretics for now   GERD -PPI   Nash liver cirrhosis -LFTs currently stable -MELD score of 24 on 12/14   OSA on CPAP -PAP at bedtime now that patient is extubated   Status post CABG x 4 -Hold home medications for now   Physical deconditioning -PT recommending SNF   Class II obesity -BMI 35.68     DVT prophylaxis: SCDs Code Status: Full Family Communication: None at bedside Disposition Plan:  Status is: Inpatient Remains inpatient appropriate because: Need for mechanical ventilation, IV medications.     Consultants:  GI   Procedures:  EGD with variceal banding 12/14   Antimicrobials:  Anti-infectives (From admission, onward)    Start     Dose/Rate Route Frequency Ordered Stop  11/25/22 1215  cefTRIAXone (ROCEPHIN) 1 g in sodium chloride 0.9 % 100 mL IVPB        1 g 200 mL/hr over 30 Minutes Intravenous Every 24 hours 11/25/22 1129         Subjective: Patient seen and evaluated today while sedated on ventilator.  No acute overnight events noted.  Objective: Vitals:   11/26/22 1146 11/26/22 1200 11/26/22 1330 11/26/22 1400  BP:  (!) 107/51 (!) 108/49 (!) 111/52  Pulse:  61 61 (!) 58 (!) 59  Resp: (!) 24 (!) 25  19  Temp:      TempSrc:      SpO2: 99% 100%  100%  Weight:      Height:        Intake/Output Summary (Last 24 hours) at 11/26/2022 1452 Last data filed at 11/26/2022 0904 Gross per 24 hour  Intake 1211.61 ml  Output 950 ml  Net 261.61 ml   Filed Weights   11/24/22 2329 11/26/22 0604  Weight: 114.4 kg 111.5 kg    Examination:  General exam: Appears calm and comfortable, sedated Respiratory system: Clear to auscultation. Respiratory effort normal.  Intubated on mechanical ventilator Cardiovascular system: S1 & S2 heard, RRR.  Gastrointestinal system: Abdomen is distended/tense Central nervous system: Sedated Extremities: No edema Skin: No significant lesions noted Psychiatry: Flat affect.    Data Reviewed: I have personally reviewed following labs and imaging studies  CBC: Recent Labs  Lab 11/21/22 0129 11/24/22 0746 11/25/22 0312 11/25/22 0839 11/26/22 0439  WBC 4.6 4.1 3.3*  --  4.0  NEUTROABS  --  2.5  --   --   --   HGB 9.6* 9.9* 7.9* 7.6* 8.0*  HCT 28.6* 29.4* 23.7* 23.0* 24.3*  MCV 95.0 95.5 94.8  --  95.3  PLT 125* 142* 107*  --  740*   Basic Metabolic Panel: Recent Labs  Lab 11/22/22 0348 11/23/22 0329 11/24/22 0746 11/25/22 0312 11/26/22 0439  NA 131* 132* 129* 129* 131*  K 4.3 4.4 5.4* 4.2 3.7  CL 97* 98 94* 93* 94*  CO2 28 27 25 27 25   GLUCOSE 166* 189* 277* 190* 161*  BUN 25* 27* 31* 30* 34*  CREATININE 1.34* 1.41* 1.42* 1.54* 1.82*  CALCIUM 8.2* 8.7* 8.5* 9.0 8.8*  MG  --   --   --  2.1 1.9   GFR: Estimated Creatinine Clearance: 52.4 mL/min (A) (by C-G formula based on SCr of 1.82 mg/dL (H)). Liver Function Tests: Recent Labs  Lab 11/24/22 0746 11/25/22 0312 11/26/22 0439  AST 59* 22 22  ALT 14 9 8   ALKPHOS 85 61 60  BILITOT 3.1* 2.9* 2.7*  PROT 7.6 7.3 6.9  ALBUMIN 2.9* 3.6 3.4*   No results for input(s): "LIPASE", "AMYLASE" in the last 168 hours. Recent Labs  Lab 11/25/22 0312  11/26/22 0439  AMMONIA 64* 58*   Coagulation Profile: Recent Labs  Lab 11/25/22 0312 11/26/22 0439  INR 1.4* 1.6*   Cardiac Enzymes: No results for input(s): "CKTOTAL", "CKMB", "CKMBINDEX", "TROPONINI" in the last 168 hours. BNP (last 3 results) No results for input(s): "PROBNP" in the last 8760 hours. HbA1C: No results for input(s): "HGBA1C" in the last 72 hours. CBG: Recent Labs  Lab 11/25/22 2016 11/26/22 0003 11/26/22 0529 11/26/22 0711 11/26/22 1246  GLUCAP 147* 155* 167* 167* 150*   Lipid Profile: Recent Labs    11/26/22 0439  TRIG 68   Thyroid Function Tests: No results for input(s): "TSH", "T4TOTAL", "FREET4", "T3FREE", "THYROIDAB"  in the last 72 hours. Anemia Panel: No results for input(s): "VITAMINB12", "FOLATE", "FERRITIN", "TIBC", "IRON", "RETICCTPCT" in the last 72 hours. Sepsis Labs: No results for input(s): "PROCALCITON", "LATICACIDVEN" in the last 168 hours.  Recent Results (from the past 240 hour(s))  Culture, body fluid w Gram Stain-bottle     Status: None   Collection Time: 11/18/22 11:40 AM   Specimen: Ascitic  Result Value Ref Range Status   Specimen Description ASCITIC  Final   Special Requests BOTTLES DRAWN AEROBIC AND ANAEROBIC 10CC  Final   Culture   Final    NO GROWTH 5 DAYS Performed at [facility], [address], [place], [place] [address]    Report Status 11/23/2022 FINAL  Final  Gram stain     Status: None   Collection Time: 11/18/22 11:40 AM   Specimen: Ascitic  Result Value Ref Range Status   Specimen Description ASCITIC  Final   Special Requests NONE  Final   Gram Stain   Final    NO ORGANISMS SEEN WBC PRESENT, PREDOMINANTLY MONONUCLEAR CYTOSPIN SMEAR Performed at [facility], [address], [place], [place] [address]    Report Status 11/18/2022 FINAL  Final  MRSA Next Gen by PCR, Nasal     Status: Abnormal   Collection Time: 11/25/22  4:00 PM   Specimen: Nasal Mucosa; Nasal Swab  Result Value Ref Range Status    MRSA by PCR Next Gen DETECTED (A) NOT DETECTED Final    Comment: RESULT CALLED TO, READ BACK BY AND VERIFIED WITH: ELLER,J ON 11/25/22 AT 2255 BY LOY,C (NOTE) The GeneXpert MRSA Assay (FDA approved for NASAL specimens only), is one component of a comprehensive MRSA colonization surveillance program. It is not intended to diagnose MRSA infection nor to guide or monitor treatment for MRSA infections. Test performance is not FDA approved in patients less than 16 years old. Performed at [facility], [address] [place], [place] [address]   Gram stain     Status: None (Preliminary result)   Collection Time: 11/26/22 11:51 AM   Specimen: Peritoneal Washings  Result Value Ref Range Status   Specimen Description PERITONEAL  Final   Special Requests NONE  Final   Gram Stain   Final    NO ORGANISMS SEEN WBC PRESENT, PREDOMINANTLY MONONUCLEAR CYTOSPIN SMEAR Performed at [facility], [address], [place], [place] [address]    Report Status PENDING  Incomplete  Culture, body fluid w Gram Stain-bottle     Status: None (Preliminary result)   Collection Time: 11/26/22 11:51 AM   Specimen: Peritoneal Washings  Result Value Ref Range Status   Specimen Description PERITONEAL  Final   Special Requests   Final    BOTTLES DRAWN AEROBIC AND ANAEROBIC 10CC Performed at [facility], [address], [place], [place] [address]    Culture PENDING  Incomplete   Report Status PENDING  Incomplete         Radiology Studies: US Paracentesis  Result Date: 11/26/2022 Lavonia Dana, MD     11/26/2022 12:48 PM PreOperative Dx: Cirrhosis due to NASH, ascites Postoperative Dx: Cirrhosis due to NASH, ascites Procedure:   US guided paracentesis Radiologist:  Thornton Papas Anesthesia:  10 ml of1% lidocaine Specimen:  4 L of dark amber ascitic fluid EBL:   < 1 ml Complications:  None  DG CHEST PORT 1 VIEW  Result Date: 11/26/2022 CLINICAL DATA:  Acute respiratory failure. EXAM: PORTABLE CHEST 1 VIEW COMPARISON:   One-view chest x-ray 11/24/2022 FINDINGS: Heart size  is exaggerated by low lung volumes. Progressive pulmonary vascular congestion and mild edema is now present. No definite effusions are present. Chronic elevation of the right hemidiaphragm noted. Visualized soft tissues and bony thorax are otherwise stable. IMPRESSION: Progressive pulmonary vascular congestion and mild edema. Electronically Signed   By: San Morelle M.D.   On: 11/26/2022 09:40        Scheduled Meds:  Chlorhexidine Gluconate Cloth  6 each Topical Daily   insulin aspart  0-5 Units Subcutaneous QHS   insulin aspart  0-9 Units Subcutaneous TID WC   insulin aspart  14 Units Subcutaneous TID WC   insulin glargine-yfgn  36 Units Subcutaneous Daily   midodrine  10 mg Oral TID WC   mupirocin ointment  1 Application Nasal BID   mouth rinse  15 mL Mouth Rinse 4 times per day   pantoprazole (PROTONIX) IV  40 mg Intravenous Q12H   sodium chloride flush  3 mL Intravenous Q12H   Continuous Infusions:  sodium chloride 250 mL (11/26/22 1250)   cefTRIAXone (ROCEPHIN)  IV 1 g (11/26/22 1252)   octreotide (SANDOSTATIN) 500 mcg in sodium chloride 0.9 % 250 mL (2 mcg/mL) infusion 50 mcg/hr (11/26/22 0904)     LOS: 1 day    Time spent: 35 minutes    Bianka Liberati Darleen Crocker, DO [facility]  If 7PM-7AM, please contact night-coverage [email or web address] 11/26/2022, 2:52 PM

## 2022-11-26 NOTE — TOC Progression Note (Signed)
Transition of Care [facility]) - Progression Note    Patient Details  Name: [name] MRN: [id number] Date of Birth: 1961-10-06  Transition of [facility] CM/SW Contact  Salome Arnt, [place] Phone Number: 11/26/2022, 3:55 PM  Clinical Narrative:  LCSW spoke with Festus Holts at [facility] who states they have canceled authorization because pt is not close to being medically ready. SNF will need to restart auth next week. Pt's case manager is Elzie Rings [phone]).       Expected Discharge Plan: [place] Barriers to Discharge: Continued Medical Work up  Expected Discharge Plan and Services Expected Discharge Plan: [place] In-house Referral: Clinical Social Work   Post Acute Care Choice: [place] Living arrangements for the past 2 months: Single Family Home                                       Social Determinants of Health (SDOH) Interventions    Readmission Risk Interventions    11/22/2022    8:40 AM 08/25/2021    2:25 PM 07/22/2021   11:48 AM  Readmission Risk Prevention Plan  Transportation Screening Complete Complete Complete  HRI or Home Care Consult   Complete  Social Work Consult for [place] Planning/Counseling   Complete  Palliative Care Screening   Not Applicable  Medication Review Press photographer) Complete Complete Complete  HRI or Home Care Consult Complete Complete   SW Recovery Care/Counseling Consult Complete Complete   Palliative Care Screening Not Applicable Not Applicable   Skilled Nursing Facility Not Applicable Complete

## 2022-11-26 NOTE — Plan of Care (Signed)
  Problem: Education: Goal: Knowledge of General Education information will improve Description: Including pain rating scale, medication(s)/side effects and non-pharmacologic comfort measures Outcome: Progressing   Problem: Health Behavior/Discharge Planning: Goal: Ability to manage health-related needs will improve Outcome: Progressing   Problem: Clinical Measurements: Goal: Ability to maintain clinical measurements within normal limits will improve Outcome: Progressing Goal: Will remain free from infection Outcome: Progressing Goal: Diagnostic test results will improve Outcome: Progressing Goal: Respiratory complications will improve Outcome: Progressing Goal: Cardiovascular complication will be avoided Outcome: Progressing   Problem: Activity: Goal: Risk for activity intolerance will decrease Outcome: Progressing   Problem: Nutrition: Goal: Adequate nutrition will be maintained Outcome: Progressing   Problem: Coping: Goal: Level of anxiety will decrease Outcome: Progressing   Problem: Elimination: Goal: Will not experience complications related to bowel motility Outcome: Progressing Goal: Will not experience complications related to urinary retention Outcome: Progressing   Problem: Pain Managment: Goal: General experience of comfort will improve Outcome: Progressing   Problem: Safety: Goal: Ability to remain free from injury will improve Outcome: Progressing   Problem: Skin Integrity: Goal: Risk for impaired skin integrity will decrease Outcome: Progressing   Problem: Education: Goal: Ability to describe self-care measures that may prevent or decrease complications (Diabetes Survival Skills Education) will improve Outcome: Progressing Goal: Individualized Educational Video(s) Outcome: Progressing   Problem: Coping: Goal: Ability to adjust to condition or change in health will improve Outcome: Progressing   Problem: Fluid Volume: Goal: Ability to  maintain a balanced intake and output will improve Outcome: Progressing   Problem: Health Behavior/Discharge Planning: Goal: Ability to identify and utilize available resources and services will improve Outcome: Progressing Goal: Ability to manage health-related needs will improve Outcome: Progressing   Problem: Metabolic: Goal: Ability to maintain appropriate glucose levels will improve Outcome: Progressing   Problem: Nutritional: Goal: Maintenance of adequate nutrition will improve Outcome: Progressing Goal: Progress toward achieving an optimal weight will improve Outcome: Progressing   Problem: Skin Integrity: Goal: Risk for impaired skin integrity will decrease Outcome: Progressing   Problem: Tissue Perfusion: Goal: Adequacy of tissue perfusion will improve Outcome: Progressing   Problem: Education: Goal: Knowledge of General Education information will improve Description: Including pain rating scale, medication(s)/side effects and non-pharmacologic comfort measures Outcome: Progressing   Problem: Health Behavior/Discharge Planning: Goal: Ability to manage health-related needs will improve Outcome: Progressing   Problem: Clinical Measurements: Goal: Ability to maintain clinical measurements within normal limits will improve Outcome: Progressing Goal: Will remain free from infection Outcome: Progressing Goal: Diagnostic test results will improve Outcome: Progressing Goal: Respiratory complications will improve Outcome: Progressing Goal: Cardiovascular complication will be avoided Outcome: Progressing   Problem: Activity: Goal: Risk for activity intolerance will decrease Outcome: Progressing   Problem: Nutrition: Goal: Adequate nutrition will be maintained Outcome: Progressing   Problem: Coping: Goal: Level of anxiety will decrease Outcome: Progressing   Problem: Elimination: Goal: Will not experience complications related to bowel motility Outcome:  Progressing Goal: Will not experience complications related to urinary retention Outcome: Progressing   Problem: Pain Managment: Goal: General experience of comfort will improve Outcome: Progressing   Problem: Safety: Goal: Ability to remain free from injury will improve Outcome: Progressing   Problem: Skin Integrity: Goal: Risk for impaired skin integrity will decrease Outcome: Progressing

## 2022-11-26 NOTE — Progress Notes (Signed)
Patient extubated and placed on Bipap by RT and writer per verbal from Dr Elsworth Soho. Mouth care provided both before and after extubation. Patient tolerating Bipap well so far. Patient alert with eyes open and following commands. Head of bed elevated and patient sitting up currently. Propofol wasted with Lunette Stands RN witness. Will continue to monitor.

## 2022-11-27 DIAGNOSIS — I8510 Secondary esophageal varices without bleeding: Secondary | ICD-10-CM

## 2022-11-27 DIAGNOSIS — D508 Other iron deficiency anemias: Secondary | ICD-10-CM | POA: Diagnosis not present

## 2022-11-27 DIAGNOSIS — K922 Gastrointestinal hemorrhage, unspecified: Secondary | ICD-10-CM | POA: Diagnosis not present

## 2022-11-27 DIAGNOSIS — R188 Other ascites: Secondary | ICD-10-CM | POA: Diagnosis not present

## 2022-11-27 DIAGNOSIS — R531 Weakness: Secondary | ICD-10-CM | POA: Diagnosis not present

## 2022-11-27 LAB — CBC
HCT: 28.9 % — ABNORMAL LOW (ref 39.0–52.0)
Hemoglobin: 9.5 g/dL — ABNORMAL LOW (ref 13.0–17.0)
MCH: 32.1 pg (ref 26.0–34.0)
MCHC: 32.9 g/dL (ref 30.0–36.0)
MCV: 97.6 fL (ref 80.0–100.0)
Platelets: 157 10*3/uL (ref 150–400)
RBC: 2.96 MIL/uL — ABNORMAL LOW (ref 4.22–5.81)
RDW: 17.2 % — ABNORMAL HIGH (ref 11.5–15.5)
WBC: 4.7 10*3/uL (ref 4.0–10.5)
nRBC: 0 % (ref 0.0–0.2)

## 2022-11-27 LAB — GLUCOSE, CAPILLARY
Glucose-Capillary: 149 mg/dL — ABNORMAL HIGH (ref 70–99)
Glucose-Capillary: 151 mg/dL — ABNORMAL HIGH (ref 70–99)
Glucose-Capillary: 159 mg/dL — ABNORMAL HIGH (ref 70–99)
Glucose-Capillary: 163 mg/dL — ABNORMAL HIGH (ref 70–99)
Glucose-Capillary: 192 mg/dL — ABNORMAL HIGH (ref 70–99)
Glucose-Capillary: 233 mg/dL — ABNORMAL HIGH (ref 70–99)

## 2022-11-27 LAB — COMPREHENSIVE METABOLIC PANEL
ALT: 9 U/L (ref 0–44)
AST: 20 U/L (ref 15–41)
Albumin: 3.2 g/dL — ABNORMAL LOW (ref 3.5–5.0)
Alkaline Phosphatase: 63 U/L (ref 38–126)
Anion gap: 12 (ref 5–15)
BUN: 34 mg/dL — ABNORMAL HIGH (ref 8–23)
CO2: 27 mmol/L (ref 22–32)
Calcium: 8.6 mg/dL — ABNORMAL LOW (ref 8.9–10.3)
Chloride: 93 mmol/L — ABNORMAL LOW (ref 98–111)
Creatinine, Ser: 1.75 mg/dL — ABNORMAL HIGH (ref 0.61–1.24)
GFR, Estimated: 43 mL/min — ABNORMAL LOW (ref >60)
Glucose, Bld: 155 mg/dL — ABNORMAL HIGH (ref 70–99)
Potassium: 4.1 mmol/L (ref 3.5–5.1)
Sodium: 132 mmol/L — ABNORMAL LOW (ref 135–145)
Total Bilirubin: 2.7 mg/dL — ABNORMAL HIGH (ref 0.3–1.2)
Total Protein: 7.1 g/dL (ref 6.5–8.1)

## 2022-11-27 LAB — AMMONIA: Ammonia: 32 umol/L (ref 9–35)

## 2022-11-27 LAB — PROTIME-INR
INR: 1.4 — ABNORMAL HIGH (ref 0.8–1.2)
Prothrombin Time: 17.1 seconds — ABNORMAL HIGH (ref 11.4–15.2)

## 2022-11-27 LAB — MAGNESIUM: Magnesium: 2.1 mg/dL (ref 1.7–2.4)

## 2022-11-27 MED ORDER — ALBUMIN HUMAN 25 % IV SOLN
50.0000 g | Freq: Three times a day (TID) | INTRAVENOUS | Status: DC
  Administered 2022-11-27 – 2022-11-30 (×10): 50 g via INTRAVENOUS
  Filled 2022-11-27 (×12): qty 200

## 2022-11-27 NOTE — Progress Notes (Signed)
Patient's wife Arrie Aran called and was updated with new orders to transfer to unit 300 and the advanced diet. Wife asked if she could request a hemoglobin check and agreed to speak with the provider. Patient is stable and not ina nay distress. No stools this morning. Will cont to monitor and endorse.

## 2022-11-27 NOTE — Progress Notes (Signed)
PROGRESS NOTE    [name]  QMV:[id number] DOB: 07-Nov-1961 DOA: 11/24/2022 PCP: [name], MD   Brief Narrative:    [name] is a 61 y.o. male with medical history significant for [name] cirrhosis with ascites, chronic kidney disease stage IIIa, obstructive sleep apnea on CPAP, type 2 diabetes with nephropathy, anemia of chronic kidney disease, diastolic heart failure and class II obesity who was just recently discharged from the hospital on 12/12 with treatment of a ascites with paracentesis after he presented for generalized weakness and increased weight gain and shortness of breath.  He had 4 L of fluid removed on 12/7 and appears to require another paracentesis and is complaining again of significant weakness and inability to ambulate at home.   He was noted to have GI bleeding with worsening anemia on 12/14 and underwent EGD with esophageal varices noted and had some banding.  No stigmata of bleeding was noted, however he returned intubated and on ventilator.  He was subsequently extubated on 12/15 and did undergo 4 L paracentesis as well.   Assessment & Plan:   Principal Problem:   Weakness Active Problems:   Essential hypertension   Hyperglycemia   S/P CABG x 4   OSA on CPAP   NASH cirrhosis of liver (HCC)   Anasarca   Class 2 obesity   Hyponatremia   CKD stage 3 due to type 2 diabetes mellitus (HCC)   Anemia   GI bleed   CAD (coronary artery disease)   HLD (hyperlipidemia)   Chronic idiopathic thrombocytopenia (HCC)   Decompensated hepatic cirrhosis (HCC)   Ascites  Assessment and Plan:   Acute blood loss anemia secondary to GI bleed-stable -Status post EGD with esophageal varices and banding performed -Now extubated and started on clear liquid diet -Rocephin 1 g daily for 5 days -PPI twice daily -Monitor CBC -Advance to soft diet today   Generalized weakness in the setting of recurrent ascites due to NASH cirrhosis-decompensated  cirrhosis - plan to give albumin for now and await paracentesis once abdomen is more tense and plan to do 4 L paracentesis as needed -PT recommending SNF -4 L paracentesis performed on 12/15 -Okay for transfer to MedSurg   Mild hyponatremia-stable -Likely due to volume overload -Monitor closely   Chronic diastolic CHF -No indication for exacerbation at this time, monitor   Stage II pressure injury to sacrum -Reposition and keep dry -Wound care   Dyslipidemia -continue statin and monitor LFTs   Type 2 diabetes with hyperglycemia -semglee 36 units daily -Novolog 14 units TID -SSI 4 hours and holding diet for now -Appreciate diabetes coordinator   AKI on CKD stage IIIa-improving -Continue to monitor -Avoid nephrotoxic agents -Holding diuretics for now   GERD -PPI   Nash liver cirrhosis -LFTs currently stable -MELD score of 24 on 12/14   OSA on CPAP -PAP at bedtime now that patient is extubated   Status post CABG x 4 -Hold home medications for now   Physical deconditioning -PT recommending SNF   Class II obesity -BMI 35.68     DVT prophylaxis: SCDs Code Status: Full Family Communication: None at bedside Disposition Plan:  Status is: Inpatient Remains inpatient appropriate because: Need for mechanical ventilation, IV medications.     Consultants:  GI Pulmonology   Procedures:  EGD with variceal banding 12/14 and subsequent intubation Extubation 12/15 4 L paracentesis 12/15    Antimicrobials:  Anti-infectives (From admission, onward)    Start     Dose/Rate  Route Frequency Ordered Stop   11/25/22 1215  cefTRIAXone (ROCEPHIN) 1 g in sodium chloride 0.9 % 100 mL IVPB        1 g 200 mL/hr over 30 Minutes Intravenous Every 24 hours 11/25/22 1129        Subjective: Patient seen and evaluated today with no new acute complaints or concerns. No acute concerns or events noted overnight.  He has been able to tolerate clear liquid diet and has had no  acute overnight events noted.  Objective: Vitals:   11/27/22 0759 11/27/22 0800 11/27/22 0900 11/27/22 1000  BP: (!) 98/46 (!) 98/46 131/74   Pulse: 77 77 76 76  Resp: 17 18 (!) 22 19  Temp: 98.1 F (36.7 C)     TempSrc: Oral     SpO2: 99% 99% 100% 100%  Weight:      Height:        Intake/Output Summary (Last 24 hours) at 11/27/2022 1127 Last data filed at 11/27/2022 0900 Gross per 24 hour  Intake 466.77 ml  Output 1650 ml  Net -1183.23 ml   Filed Weights   11/24/22 2329 11/26/22 0604  Weight: 114.4 kg 111.5 kg    Examination:  General exam: Appears calm and comfortable  Respiratory system: Clear to auscultation. Respiratory effort normal.  2 L nasal cannula Cardiovascular system: S1 & S2 heard, RRR.  Gastrointestinal system: Abdomen is soft, minimally distended Central nervous system: Alert and awake Extremities: No edema Skin: No significant lesions noted Psychiatry: Flat affect.    Data Reviewed: I have personally reviewed following labs and imaging studies  CBC: Recent Labs  Lab 11/21/22 0129 11/24/22 0746 11/25/22 0312 11/25/22 0839 11/26/22 0439  WBC 4.6 4.1 3.3*  --  4.0  NEUTROABS  --  2.5  --   --   --   HGB 9.6* 9.9* 7.9* 7.6* 8.0*  HCT 28.6* 29.4* 23.7* 23.0* 24.3*  MCV 95.0 95.5 94.8  --  95.3  PLT 125* 142* 107*  --  606*   Basic Metabolic Panel: Recent Labs  Lab 11/23/22 0329 11/24/22 0746 11/25/22 0312 11/26/22 0439 11/27/22 0409  NA 132* 129* 129* 131* 132*  K 4.4 5.4* 4.2 3.7 4.1  CL 98 94* 93* 94* 93*  CO2 27 25 27 25 27   GLUCOSE 189* 277* 190* 161* 155*  BUN 27* 31* 30* 34* 34*  CREATININE 1.41* 1.42* 1.54* 1.82* 1.75*  CALCIUM 8.7* 8.5* 9.0 8.8* 8.6*  MG  --   --  2.1 1.9 2.1   GFR: Estimated Creatinine Clearance: 54.5 mL/min (A) (by C-G formula based on SCr of 1.75 mg/dL (H)). Liver Function Tests: Recent Labs  Lab 11/24/22 0746 11/25/22 0312 11/26/22 0439 11/27/22 0409  AST 59* 22 22 20   ALT 14 9 8 9   ALKPHOS  85 61 60 63  BILITOT 3.1* 2.9* 2.7* 2.7*  PROT 7.6 7.3 6.9 7.1  ALBUMIN 2.9* 3.6 3.4* 3.2*   No results for input(s): "LIPASE", "AMYLASE" in the last 168 hours. Recent Labs  Lab 11/25/22 0312 11/26/22 0439 11/27/22 0409  AMMONIA 64* 58* 32   Coagulation Profile: Recent Labs  Lab 11/25/22 0312 11/26/22 0439 11/27/22 0409  INR 1.4* 1.6* 1.4*   Cardiac Enzymes: No results for input(s): "CKTOTAL", "CKMB", "CKMBINDEX", "TROPONINI" in the last 168 hours. BNP (last 3 results) No results for input(s): "PROBNP" in the last 8760 hours. HbA1C: No results for input(s): "HGBA1C" in the last 72 hours. CBG: Recent Labs  Lab 11/26/22 1728 11/26/22  2015 11/27/22 0007 11/27/22 0411 11/27/22 0801  GLUCAP 154* 252* 163* 149* 151*   Lipid Profile: Recent Labs    11/26/22 0439  TRIG 68   Thyroid Function Tests: No results for input(s): "TSH", "T4TOTAL", "FREET4", "T3FREE", "THYROIDAB" in the last 72 hours. Anemia Panel: No results for input(s): "VITAMINB12", "FOLATE", "FERRITIN", "TIBC", "IRON", "RETICCTPCT" in the last 72 hours. Sepsis Labs: No results for input(s): "PROCALCITON", "LATICACIDVEN" in the last 168 hours.  Recent Results (from the past 240 hour(s))  Culture, body fluid w Gram Stain-bottle     Status: None   Collection Time: 11/18/22 11:40 AM   Specimen: Ascitic  Result Value Ref Range Status   Specimen Description ASCITIC  Final   Special Requests BOTTLES DRAWN AEROBIC AND ANAEROBIC 10CC  Final   Culture   Final    NO GROWTH 5 DAYS Performed at [facility], [address], [place], [place] [address]    Report Status 11/23/2022 FINAL  Final  Gram stain     Status: None   Collection Time: 11/18/22 11:40 AM   Specimen: Ascitic  Result Value Ref Range Status   Specimen Description ASCITIC  Final   Special Requests NONE  Final   Gram Stain   Final    NO ORGANISMS SEEN WBC PRESENT, PREDOMINANTLY MONONUCLEAR CYTOSPIN SMEAR Performed at [facility], [address], [place], [place] [address]    Report Status 11/18/2022 FINAL  Final  MRSA Next Gen by PCR, Nasal     Status: Abnormal   Collection Time: 11/25/22  4:00 PM   Specimen: Nasal Mucosa; Nasal Swab  Result Value Ref Range Status   MRSA by PCR Next Gen DETECTED (A) NOT DETECTED Final    Comment: RESULT CALLED TO, READ BACK BY AND VERIFIED WITH: ELLER,J ON 11/25/22 AT 2255 BY LOY,C (NOTE) The GeneXpert MRSA Assay (FDA approved for NASAL specimens only), is one component of a comprehensive MRSA colonization surveillance program. It is not intended to diagnose MRSA infection nor to guide or monitor treatment for MRSA infections. Test performance is not FDA approved in patients less than 32 years old. Performed at [facility], [address], [place], [place] [address]   Gram stain     Status: None   Collection Time: 11/26/22 11:51 AM   Specimen: Peritoneal Washings  Result Value Ref Range Status   Specimen Description PERITONEAL  Final   Special Requests NONE  Final   Gram Stain   Final    NO ORGANISMS SEEN WBC PRESENT, PREDOMINANTLY MONONUCLEAR CYTOSPIN SMEAR Performed at [facility], [address] [place], [place] [address]    Report Status 11/26/2022 FINAL  Final  Culture, body fluid w Gram Stain-bottle     Status: None (Preliminary result)   Collection Time: 11/26/22 11:51 AM   Specimen: Peritoneal Washings  Result Value Ref Range Status   Specimen Description PERITONEAL  Final   Special Requests BOTTLES DRAWN AEROBIC AND ANAEROBIC 10CC  Final   Culture   Final    NO GROWTH < 12 HOURS Performed at [facility], [address], [place], [place] [address]    Report Status PENDING  Incomplete         Radiology Studies: US Paracentesis  Result Date: 11/26/2022 INDICATION: Cirrhosis secondary to NASH, ascites EXAM: ULTRASOUND GUIDED DIAGNOSTIC AND THERAPEUTIC PARACENTESIS MEDICATIONS: None. COMPLICATIONS: None immediate. PROCEDURE: Informed written consent was obtained  from the patient after a discussion of the risks, benefits and alternatives to treatment. A timeout was performed  prior to the initiation of the procedure. Initial ultrasound scanning demonstrates a large amount of ascites within the right lower abdominal quadrant. The right lower abdomen was prepped and draped in the usual sterile fashion. 1% lidocaine was used for local anesthesia. Following this, a 19 gauge, 7-cm, Yueh catheter was introduced. An ultrasound image was saved for documentation purposes. The paracentesis was performed. The catheter was removed and a dressing was applied. The patient tolerated the procedure well without immediate post procedural complication. Patient received post-procedure intravenous albumin; see nursing notes for details. FINDINGS: A total of approximately 4 L of dark amber colored fluid was removed. Samples were sent to the laboratory as requested by the clinical team. IMPRESSION: Successful ultrasound-guided paracentesis yielding 4 liters of peritoneal fluid. Electronically Signed   By: Lavonia Dana M.D.   On: 11/26/2022 12:48   DG CHEST PORT 1 VIEW  Result Date: 11/26/2022 CLINICAL DATA:  Acute respiratory failure. EXAM: PORTABLE CHEST 1 VIEW COMPARISON:  One-view chest x-ray 11/24/2022 FINDINGS: Heart size is exaggerated by low lung volumes. Progressive pulmonary vascular congestion and mild edema is now present. No definite effusions are present. Chronic elevation of the right hemidiaphragm noted. Visualized soft tissues and bony thorax are otherwise stable. IMPRESSION: Progressive pulmonary vascular congestion and mild edema. Electronically Signed   By: San Morelle M.D.   On: 11/26/2022 09:40        Scheduled Meds:  Chlorhexidine Gluconate Cloth  6 each Topical Daily   insulin aspart  0-5 Units Subcutaneous QHS   insulin aspart  0-9 Units Subcutaneous TID WC   insulin aspart  14 Units Subcutaneous TID WC   insulin glargine-yfgn  36 Units Subcutaneous  Daily   midodrine  10 mg Oral TID WC   mupirocin ointment  1 Application Nasal BID   mouth rinse  15 mL Mouth Rinse 4 times per day   pantoprazole (PROTONIX) IV  40 mg Intravenous Q12H   sodium chloride flush  3 mL Intravenous Q12H   Continuous Infusions:  sodium chloride 10 mL/hr at 11/26/22 1516   cefTRIAXone (ROCEPHIN)  IV Stopped (11/26/22 1325)     LOS: 2 days    Time spent: 35 minutes    Niya Behler Darleen Crocker, DO [facility]  If 7PM-7AM, please contact night-coverage [email or web address] 11/27/2022, 11:27 AM

## 2022-11-27 NOTE — Clinical Note (Incomplete)
Called back to unit 300. Spoke to [place] who informs that [employer] LPN>

## 2022-11-27 NOTE — Progress Notes (Signed)
Report given to [place] LPN on unit 953 at [id number]. ICU CN [place], advised that [facility] Med/surge cannot hang albumin and that patient needed to stay in room ICU 7 to receive the albumin. Patient transported to AP 314 at 1555.

## 2022-11-27 NOTE — Progress Notes (Signed)
Maylon Peppers, M.D. Gastroenterology & Hepatology   Interval History:  Patient reports feeling well, denies any complaints. Able to tolerate clear liquid diet. The patient denies having any worsening abdominal distention or pain in his abdomen.  No melena or hematochezia reported. Hemoglobin has remained stable at 9.5.  Inpatient Medications:  Current Facility-Administered Medications:    0.9 %  sodium chloride infusion, 250 mL, Intravenous, PRN, Rourk, [name], MD, Last Rate: 10 mL/hr at 11/26/22 1516, Infusion Verify at 11/26/22 1516   acetaminophen (TYLENOL) tablet 650 mg, 650 mg, Oral, Q6H PRN **OR** acetaminophen (TYLENOL) suppository 650 mg, 650 mg, Rectal, Q6H PRN, Rourk, [name], MD   albumin human 25 % solution 50 g, 50 g, Intravenous, Q8H, Montez Morita, [place] Langstaff, MD   albuterol (PROVENTIL) (2.5 MG/3ML) 0.083% nebulizer solution 3 mL, 3 mL, Inhalation, Q6H PRN, Rourk, [name], MD   cefTRIAXone (ROCEPHIN) 1 g in sodium chloride 0.9 % 100 mL IVPB, 1 g, Intravenous, Q24H, Rourk, [name], MD, Last Rate: 200 mL/hr at 11/27/22 1146, 1 g at 11/27/22 1146   Chlorhexidine Gluconate Cloth 2 % PADS 6 each, 6 each, Topical, Daily, Manuella Ghazi, Pratik D, DO, 6 each at 11/26/22 0557   insulin aspart (novoLOG) injection 0-5 Units, 0-5 Units, Subcutaneous, QHS, Rourk, [name], MD, 2 Units at 11/26/22 2144   insulin aspart (novoLOG) injection 0-9 Units, 0-9 Units, Subcutaneous, TID WC, Rourk, [name], MD, 3 Units at 11/27/22 1142   insulin aspart (novoLOG) injection 14 Units, 14 Units, Subcutaneous, TID WC, Rourk, [name], MD, 14 Units at 11/27/22 1142   insulin glargine-yfgn (SEMGLEE) injection 36 Units, 36 Units, Subcutaneous, Daily, Rourk, [name], MD, 36 Units at 11/27/22 1037   midodrine (PROAMATINE) tablet 10 mg, 10 mg, Oral, TID WC, Rourk, [name], MD, 10 mg at 11/27/22 1141   mupirocin ointment (BACTROBAN) 2 % 1 Application, 1 Application, Nasal, BID, Manuella Ghazi, Pratik D, DO, 1 Application  at 59/56/38 1038   ondansetron (ZOFRAN) tablet 4 mg, 4 mg, Oral, Q6H PRN **OR** ondansetron (ZOFRAN) injection 4 mg, 4 mg, Intravenous, Q6H PRN, Daneil Dolin, MD   Oral care mouth rinse, 15 mL, Mouth Rinse, 4 times per day, Manuella Ghazi, Pratik D, DO, 15 mL at 11/27/22 1146   Oral care mouth rinse, 15 mL, Mouth Rinse, PRN, Manuella Ghazi, Pratik D, DO   oxyCODONE (Oxy IR/ROXICODONE) immediate release tablet 5 mg, 5 mg, Oral, Q4H PRN, Rourk, [name], MD   pantoprazole (PROTONIX) injection 40 mg, 40 mg, Intravenous, Q12H, Rourk, [name], MD, 40 mg at 11/27/22 1037   sodium chloride flush (NS) 0.9 % injection 3 mL, 3 mL, Intravenous, Q12H, Rourk, [name], MD, 3 mL at 11/27/22 1042   sodium chloride flush (NS) 0.9 % injection 3 mL, 3 mL, Intravenous, PRN, Daneil Dolin, MD   I/O    Intake/Output Summary (Last 24 hours) at 11/27/2022 1220 Last data filed at 11/27/2022 0900 Gross per 24 hour  Intake 466.77 ml  Output 1650 ml  Net -1183.23 ml     Physical Exam: Temp:  [98.1 F (36.7 C)-99.8 F (37.7 C)] 98.7 F (37.1 C) (12/16 1135) Pulse Rate:  [58-77] 72 (12/16 1135) Resp:  [16-24] 24 (12/16 1135) BP: (98-144)/(44-74) 131/74 (12/16 0900) SpO2:  [97 %-100 %] 98 % (12/16 1135)  Temp (24hrs), Avg:98.7 F (37.1 C), Min:98.1 F (36.7 C), Max:99.8 F (37.7 C)  GENERAL: The patient is AO x3, in no acute distress. HEENT: Head is normocephalic and atraumatic. EOMI  are intact. Mouth is well hydrated and without lesions. NECK: Supple. No masses LUNGS: Clear to auscultation. No presence of rhonchi/wheezing/rales. Adequate chest expansion HEART: RRR, normal s1 and s2. ABDOMEN: Soft, nontender, no guarding, no peritoneal signs, and nondistended. BS +. No masses. EXTREMITIES: Without any cyanosis, clubbing, rash, lesions or edema. NEUROLOGIC: AOx3, no focal motor deficit. SKIN: no jaundice, no rashes  Laboratory Data: CBC:     Component Value Date/Time   WBC 4.7 11/27/2022 0409   RBC 2.96 (L)  11/27/2022 0409   HGB 9.5 (L) 11/27/2022 0409   HGB 12.5 (L) 05/03/2019 1005   HGB 10.1 (L) 12/15/2018 1525   HCT 28.9 (L) 11/27/2022 0409   HCT 30.2 (L) 12/15/2018 1525   PLT 157 11/27/2022 0409   PLT 72 (L) 05/03/2019 1005   PLT 87 (LL) 12/15/2018 1525   MCV 97.6 11/27/2022 0409   MCV 84 12/15/2018 1525   MCH 32.1 11/27/2022 0409   MCHC 32.9 11/27/2022 0409   RDW 17.2 (H) 11/27/2022 0409   RDW 15.1 12/15/2018 1525   LYMPHSABS 0.7 11/24/2022 0746   LYMPHSABS 1.5 12/15/2018 1525   MONOABS 0.7 11/24/2022 0746   EOSABS 0.1 11/24/2022 0746   EOSABS 0.1 12/15/2018 1525   BASOSABS 0.0 11/24/2022 0746   BASOSABS 0.0 12/15/2018 1525   COAG:  Lab Results  Component Value Date   INR 1.4 (H) 11/27/2022   INR 1.6 (H) 11/26/2022   INR 1.4 (H) 11/25/2022    BMP:     Latest Ref Rng & Units 11/27/2022    4:09 AM 11/26/2022    4:39 AM 11/25/2022    3:12 AM  BMP  Glucose 70 - 99 mg/dL 155  161  190   BUN 8 - 23 mg/dL 34  34  30   Creatinine 0.61 - 1.24 mg/dL 1.75  1.82  1.54   Sodium 135 - 145 mmol/L 132  131  129   Potassium 3.5 - 5.1 mmol/L 4.1  3.7  4.2   Chloride 98 - 111 mmol/L 93  94  93   CO2 22 - 32 mmol/L _0 Calcium 8.9 - 10.3 mg/dL 8.6  8.8  9.0     HEPATIC:     Latest Ref Rng & Units 11/27/2022    4:09 AM 11/26/2022    4:39 AM 11/25/2022    3:12 AM  Hepatic Function  Total Protein 6.5 - 8.1 g/dL 7.1  6.9  7.3   Albumin 3.5 - 5.0 g/dL 3.2  3.4  3.6   AST 15 - 41 U/L _1 ALT 0 - 44 U/L _2 Alk Phosphatase 38 - 126 U/L 63  60  61   Total Bilirubin 0.3 - 1.2 mg/dL 2.7  2.7  2.9     CARDIAC:  Lab Results  Component Value Date   TROPONINI <0.03 05/16/2016      Imaging: I personally reviewed and interpreted the available labs, imaging and endoscopic files.   Assessment/Plan: ????nd A???????pse All      Gastroenterology Progress Note   Referring Provider: No ref. provider found Primary Care Physician:  [name], MD Primary  Gastroenterologist:  Dr. Gala Romney   Patient ID: [name]; [id number]; 10/14/1961       Subjective   Patient undergoing extubation while entering room. Weak voice. Placed on Bipap for O2 therapy per critical care attending. Nursing to attempt bedside swallow after  bipap removed. Patient reports tenderness to lower abdomen. Nursing states abdomen appears distended.      Objective   Vital signs in last 24 hours Temp:  [97.3 F (36.3 C)-98.3 F (36.8 C)] 97.3 F (36.3 C) (12/15 0531) Pulse Rate:  [47-76] 64 (12/15 0916) Resp:  [14-23] 22 (12/15 0916) BP: (90-145)/(46-82) 104/46 (12/15 0900) SpO2:  [99 %-100 %] 99 % (12/15 0916) FiO2 (%):  [40 %-100 %] 40 % (12/15 0746) Weight:  [111.5 kg] 111.5 kg (12/15 0604) Last BM Date : 11/25/22   Physical Exam General:  Drowsy, ill appearing.  Head:  Normocephalic and atraumatic. Eyes:  No icterus, sclera clear. Conjuctiva pink.  Mouth:  Dry.  Heart:  S1, S2 present, no murmurs noted.  Lungs: Clear to auscultation bilaterally, without wheezing, rales, or rhonchi.  Abdomen:  Bowel sounds present, tense, distended. Ttp to lower abdomen. No HSM or hernias noted. No rebound or guarding. No masses appreciated  Msk:  Symmetrical without gross deformities. Normal posture. Extremities:  1+ pitting edema, venous statis.  Neurologic:  Drowsy, oriented x3;  grossly normal neurologically. Skin:  Warm and dry, intact without significant lesions. No jaundice.  Psych:  drowsy and cooperative.   Intake/Output from previous day: 12/14 0701 - 12/15 0700 In: 1179.9 [I.V.:887.8; IV Piggyback:292.1] Out: 1287 [Urine:1750] Intake/Output this shift: Total I/O In: 228 [I.V.:228] Out: -    Lab Results    Recent Labs (last 2 labs) Recent Labs   11/24/22 0746 11/25/22 0312 11/25/22 0839 11/26/22 0439 WBC 4.1 3.3*  --  4.0 HGB 9.9* 7.9* 7.6* 8.0* HCT 29.4* 23.7* 23.0* 24.3* PLT 142* 107*  --  112*    BMET  Recent Labs (last 2  labs) Recent Labs   11/24/22 0746 11/25/22 0312 11/26/22 0439 NA 129* 129* 131* K 5.4* 4.2 3.7 CL 94* 93* 94* CO2 _0 GLUCOSE 277* 190* 161* BUN 31* 30* 34* CREATININE 1.42* 1.54* 1.82* CALCIUM 8.5* 9.0 8.8*    LFT  Recent Labs (last 2 labs) Recent Labs   11/24/22 0746 11/25/22 0312 11/26/22 0439 PROT 7.6 7.3 6.9 ALBUMIN 2.9* 3.6 3.4* AST 59* 22 22 ALT _1 ALKPHOS 85 61 60 BILITOT 3.1* 2.9* 2.7*    PT/INR  Recent Labs (last 2 labs) Recent Labs   11/25/22 0312 11/26/22 0439 LABPROT 16.8* 19.0* INR 1.4* 1.6*    Hepatitis Panel  Recent Labs (last 2 labs) No results for input(s): "HEPBSAG", "HCVAB", "HEPAIGM", "HEPBIGM" in the last 72 hours.    Studies/Results   Imaging Results DG CHEST PORT 1 VIEW   Result Date: 11/26/2022 CLINICAL DATA:  Acute respiratory failure. EXAM: PORTABLE CHEST 1 VIEW COMPARISON:  One-view chest x-ray 11/24/2022 FINDINGS: Heart size is exaggerated by low lung volumes. Progressive pulmonary vascular congestion and mild edema is now present. No definite effusions are present. Chronic elevation of the right hemidiaphragm noted. Visualized soft tissues and bony thorax are otherwise stable. IMPRESSION: Progressive pulmonary vascular congestion and mild edema. Electronically Signed   By: San Morelle M.D.   On: 11/26/2022 09:40    DG Chest Port 1 View   Result Date: 11/24/2022 CLINICAL DATA:  Unable to get up, week EXAM: PORTABLE CHEST 1 VIEW COMPARISON:  10/18/2021 FINDINGS: Mild bilateral interstitial thickening. No focal consolidation. No pleural effusion or pneumothorax. Heart and mediastinal contours are unremarkable. Prior CABG. No acute osseous abnormality. IMPRESSION: 1. Mild pulmonary vascular congestion. Electronically Signed   By: Kathreen Devoid M.D.   On: 11/24/2022 09:33  US Paracentesis   Result Date: 11/18/2022 INDICATION: Recurrent ascites; NASH Cirrhosis EXAM: ULTRASOUND GUIDED RLQ PARACENTESIS  MEDICATIONS: 10 cc 1% lidocaine. COMPLICATIONS: None immediate. PROCEDURE: Informed written consent was obtained from the patient after a discussion of the risks, benefits and alternatives to treatment. A timeout was performed prior to the initiation of the procedure. Initial ultrasound scanning demonstrates a large amount of ascites within the right lower abdominal quadrant. The right lower abdomen was prepped and draped in the usual sterile fashion. 1% lidocaine was used for local anesthesia. Following this, a 7 cm Yueh catheter was introduced. An ultrasound image was saved for documentation purposes. The paracentesis was performed. The catheter was removed and a dressing was applied. The patient tolerated the procedure well without immediate post procedural complication. Patient received post-procedure intravenous albumin; see nursing notes for details. FINDINGS: A total of approximately 4 liters of dark yellow fluid was removed. Samples were sent to the laboratory as requested by the clinical team. IMPRESSION: Successful ultrasound-guided paracentesis yielding 4 liters of peritoneal fluid. PLAN: The patient has required >/=2 paracenteses in a 30 day period. He is currently being followed by the [facility]. IR is available for consideration of TIPS if local care is desired. Read by Lavonia Drafts [facility] Electronically Signed   By: Ruthann Cancer M.D.   On: 11/18/2022 13:10    DG Chest Port 1 View   Result Date: 11/18/2022 CLINICAL DATA:  Weakness, history of fall 2 weeks ago. EXAM: PORTABLE CHEST 1 VIEW COMPARISON:  10/28/2022. FINDINGS: The heart size and mediastinal contours are stable. There is atherosclerotic calcification of the aorta. Lung volumes are low with mild atelectasis at the lung bases. No definite effusion or pneumothorax. Sternotomy wires are noted. No acute osseous abnormality. IMPRESSION: Low lung volumes with atelectasis at the lung bases. Electronically Signed   By: Brett Fairy M.D.    On: 11/18/2022 03:45       Assessment 61 y.o. male with a history of [name] cirrhosis c/ ascites, HTN, OSA, type 2 diabetes, CAD s/p CABG, HFpEF, mild aortic stenosis, adrenal insufficiency, chronic kidney disease, polyclonal gammopathy, who was admitted to the hospital after presenting worsening weakness and anemia.  Patient had some maroon color stools.  Patient had worsening anemia, for which she underwent an EGD during this hospitalization.  Patient was found to have 4 columns of grade 2/3 varices with no stigmata of bleeding s/p banding x 4, portal hypertensive gastropathy.  He required to be intubated for his esophagogastroduodenospy but now is extubated and has recovered adequately after he is procedure.  Hemoglobin has remained largely stable after his procedure.  Will need to keep him on PPI twice daily but can advance his diet today.  He will need to have a repeat EGD in 4 weeks and will need to be restarted on his nadolol 20 mg daily upon discharge.  Regarding his decompensated liver disease, he had presence of recurrent ascites for which he required to have paracentesis with removal of 4 L, this was negative for SBP.  Unfortunately, use of diuretics has been limited by new AKI.  This is slowly improved with use of albumin, which should be continued for now.  No signs of hepatic encephalopathy. MELD 3.0 today was 23.  - Rocephin PPX for total 5 days - GI soft diet - Pantoprazole 40 mg BID IV - Check H/H daily - Albumin 50 g TID IV - Daily MELD labs - Repeat EGD in 4 weeks - Hold  diuretics for now  Maylon Peppers, MD Gastroenterology and Hepatology [facility] Gastroenterology

## 2022-11-27 NOTE — Progress Notes (Signed)
Call unit 300 to give report. Spoke to ann who stated she needed to call transferring nurse back.

## 2022-11-27 NOTE — Progress Notes (Signed)
Called to give report to unit 300 and was informed that they don not have a nurse to take the patient and that they dont have a room 333. Per Lolita Patella, this must have been an error done by bed placement. CN made aware.

## 2022-11-28 DIAGNOSIS — D508 Other iron deficiency anemias: Secondary | ICD-10-CM | POA: Diagnosis not present

## 2022-11-28 DIAGNOSIS — R188 Other ascites: Secondary | ICD-10-CM | POA: Diagnosis not present

## 2022-11-28 DIAGNOSIS — R531 Weakness: Secondary | ICD-10-CM | POA: Diagnosis not present

## 2022-11-28 LAB — CBC
HCT: 24.4 % — ABNORMAL LOW (ref 39.0–52.0)
Hemoglobin: 8.3 g/dL — ABNORMAL LOW (ref 13.0–17.0)
MCH: 31.9 pg (ref 26.0–34.0)
MCHC: 34 g/dL (ref 30.0–36.0)
MCV: 93.8 fL (ref 80.0–100.0)
Platelets: 111 10*3/uL — ABNORMAL LOW (ref 150–400)
RBC: 2.6 MIL/uL — ABNORMAL LOW (ref 4.22–5.81)
RDW: 16.2 % — ABNORMAL HIGH (ref 11.5–15.5)
WBC: 3.8 10*3/uL — ABNORMAL LOW (ref 4.0–10.5)
nRBC: 0 % (ref 0.0–0.2)

## 2022-11-28 LAB — COMPREHENSIVE METABOLIC PANEL
ALT: 8 U/L (ref 0–44)
AST: 19 U/L (ref 15–41)
Albumin: 4 g/dL (ref 3.5–5.0)
Alkaline Phosphatase: 51 U/L (ref 38–126)
Anion gap: 9 (ref 5–15)
BUN: 30 mg/dL — ABNORMAL HIGH (ref 8–23)
CO2: 27 mmol/L (ref 22–32)
Calcium: 8.8 mg/dL — ABNORMAL LOW (ref 8.9–10.3)
Chloride: 95 mmol/L — ABNORMAL LOW (ref 98–111)
Creatinine, Ser: 1.47 mg/dL — ABNORMAL HIGH (ref 0.61–1.24)
GFR, Estimated: 53 mL/min — ABNORMAL LOW (ref >60)
Glucose, Bld: 180 mg/dL — ABNORMAL HIGH (ref 70–99)
Potassium: 3.7 mmol/L (ref 3.5–5.1)
Sodium: 131 mmol/L — ABNORMAL LOW (ref 135–145)
Total Bilirubin: 2.6 mg/dL — ABNORMAL HIGH (ref 0.3–1.2)
Total Protein: 7.1 g/dL (ref 6.5–8.1)

## 2022-11-28 LAB — MAGNESIUM: Magnesium: 2.1 mg/dL (ref 1.7–2.4)

## 2022-11-28 LAB — GLUCOSE, CAPILLARY
Glucose-Capillary: 166 mg/dL — ABNORMAL HIGH (ref 70–99)
Glucose-Capillary: 169 mg/dL — ABNORMAL HIGH (ref 70–99)
Glucose-Capillary: 183 mg/dL — ABNORMAL HIGH (ref 70–99)
Glucose-Capillary: 185 mg/dL — ABNORMAL HIGH (ref 70–99)
Glucose-Capillary: 289 mg/dL — ABNORMAL HIGH (ref 70–99)
Glucose-Capillary: 293 mg/dL — ABNORMAL HIGH (ref 70–99)

## 2022-11-28 NOTE — Progress Notes (Signed)
PROGRESS NOTE    [name]  OIZ:[id number] DOB: 14-Oct-1961 DOA: 11/24/2022 PCP: [name], MD   Brief Narrative:    [name] is a 61 y.o. male with medical history significant for [name] cirrhosis with ascites, chronic kidney disease stage IIIa, obstructive sleep apnea on CPAP, type 2 diabetes with nephropathy, anemia of chronic kidney disease, diastolic heart failure and class II obesity who was just recently discharged from the hospital on 12/12 with treatment of a ascites with paracentesis after he presented for generalized weakness and increased weight gain and shortness of breath.  He had 4 L of fluid removed on 12/7 and appears to require another paracentesis and is complaining again of significant weakness and inability to ambulate at home.   He was noted to have GI bleeding with worsening anemia on 12/14 and underwent EGD with esophageal varices noted and had some banding.  No stigmata of bleeding was noted, however he returned intubated and on ventilator.  He was subsequently extubated on 12/15 and did undergo 4 L paracentesis as well.  He is currently awaiting SNF placement.  Assessment & Plan:   Principal Problem:   Weakness Active Problems:   Essential hypertension   Hyperglycemia   S/P CABG x 4   OSA on CPAP   NASH cirrhosis of liver (HCC)   Anasarca   Class 2 obesity   Hyponatremia   CKD stage 3 due to type 2 diabetes mellitus (HCC)   Anemia   GI bleed   CAD (coronary artery disease)   HLD (hyperlipidemia)   Chronic idiopathic thrombocytopenia (HCC)   Decompensated hepatic cirrhosis (HCC)   Ascites  Assessment and Plan:   Acute blood loss anemia secondary to GI bleed-stable -Status post EGD with esophageal varices and banding performed -Now extubated and started on clear liquid diet -Rocephin 1 g daily for 5 days day 4/5 -PPI twice daily -Monitor CBC -Currently on soft diet   Generalized weakness in the setting of recurrent ascites  due to NASH cirrhosis-decompensated cirrhosis - plan to give albumin for now and await paracentesis once abdomen is more tense and plan to do 4 L paracentesis as needed -PT recommending SNF, awaiting placement -4 L paracentesis performed on 12/15 -Albumin 50 mg 3 times daily per GI   Mild hyponatremia-stable -Likely due to volume overload -Monitor closely   Chronic diastolic CHF -No indication for exacerbation at this time, monitor   Stage II pressure injury to sacrum -Reposition and keep dry -Wound care   Dyslipidemia -continue statin and monitor LFTs   Type 2 diabetes with hyperglycemia -semglee 36 units daily -Novolog 14 units TID -SSI 4 hours and holding diet for now -Appreciate diabetes coordinator   AKI on CKD stage IIIa-improving -Continue to monitor -Avoid nephrotoxic agents -Holding diuretics for now   GERD -PPI   Nash liver cirrhosis -LFTs currently stable -MELD score of 24 on 12/14   OSA on CPAP -PAP at bedtime now that patient is extubated   Status post CABG x 4 -Hold home medications for now   Physical deconditioning -PT recommending SNF   Class II obesity -BMI 35.68     DVT prophylaxis: SCDs Code Status: Full Family Communication: None at bedside Disposition Plan:  Status is: Inpatient Remains inpatient appropriate because: Need for mechanical ventilation, IV medications.     Consultants:  GI Pulmonology   Procedures:  EGD with variceal banding 12/14 and subsequent intubation Extubation 12/15 4 L paracentesis 12/15   Antimicrobials:  Anti-infectives (From  admission, onward)    Start     Dose/Rate Route Frequency Ordered Stop   11/25/22 1215  cefTRIAXone (ROCEPHIN) 1 g in sodium chloride 0.9 % 100 mL IVPB        1 g 200 mL/hr over 30 Minutes Intravenous Every 24 hours 11/25/22 1129         Subjective: Patient seen and evaluated today with no new acute complaints or concerns. No acute concerns or events noted  overnight.  Objective: Vitals:   11/27/22 1700 11/27/22 2052 11/27/22 2323 11/28/22 0330  BP: 113/64 112/60  (!) 102/58  Pulse:  77 76 74  Resp:  (!) 22 20 20   Temp:  99.2 F (37.3 C)  98.1 F (36.7 C)  TempSrc:      SpO2:  93% 94% 93%  Weight:      Height:        Intake/Output Summary (Last 24 hours) at 11/28/2022 0929 Last data filed at 11/28/2022 0900 Gross per 24 hour  Intake 1420 ml  Output 600 ml  Net 820 ml   Filed Weights   11/24/22 2329 11/26/22 0604  Weight: 114.4 kg 111.5 kg    Examination:  General exam: Appears calm and comfortable  Respiratory system: Clear to auscultation. Respiratory effort normal. Cardiovascular system: S1 & S2 heard, RRR.  Gastrointestinal system: Abdomen is soft, mildly distended Central nervous system: Alert and awake Extremities: No edema Skin: No significant lesions noted Psychiatry: Flat affect.    Data Reviewed: I have personally reviewed following labs and imaging studies  CBC: Recent Labs  Lab 11/24/22 0746 11/25/22 0312 11/25/22 0839 11/26/22 0439 11/27/22 0409 11/28/22 0323  WBC 4.1 3.3*  --  4.0 4.7 3.8*  NEUTROABS 2.5  --   --   --   --   --   HGB 9.9* 7.9* 7.6* 8.0* 9.5* 8.3*  HCT 29.4* 23.7* 23.0* 24.3* 28.9* 24.4*  MCV 95.5 94.8  --  95.3 97.6 93.8  PLT 142* 107*  --  112* 157 604*   Basic Metabolic Panel: Recent Labs  Lab 11/24/22 0746 11/25/22 0312 11/26/22 0439 11/27/22 0409 11/28/22 0323  NA 129* 129* 131* 132* 131*  K 5.4* 4.2 3.7 4.1 3.7  CL 94* 93* 94* 93* 95*  CO2 25 27 25 27 27   GLUCOSE 277* 190* 161* 155* 180*  BUN 31* 30* 34* 34* 30*  CREATININE 1.42* 1.54* 1.82* 1.75* 1.47*  CALCIUM 8.5* 9.0 8.8* 8.6* 8.8*  MG  --  2.1 1.9 2.1 2.1   GFR: Estimated Creatinine Clearance: 64.8 mL/min (A) (by C-G formula based on SCr of 1.47 mg/dL (H)). Liver Function Tests: Recent Labs  Lab 11/24/22 0746 11/25/22 0312 11/26/22 0439 11/27/22 0409 11/28/22 0323  AST 59* 22 22 20 19   ALT 14  9 8 9 8   ALKPHOS 85 61 60 63 51  BILITOT 3.1* 2.9* 2.7* 2.7* 2.6*  PROT 7.6 7.3 6.9 7.1 7.1  ALBUMIN 2.9* 3.6 3.4* 3.2* 4.0   No results for input(s): "LIPASE", "AMYLASE" in the last 168 hours. Recent Labs  Lab 11/25/22 0312 11/26/22 0439 11/27/22 0409  AMMONIA 64* 58* 32   Coagulation Profile: Recent Labs  Lab 11/25/22 0312 11/26/22 0439 11/27/22 0409  INR 1.4* 1.6* 1.4*   Cardiac Enzymes: No results for input(s): "CKTOTAL", "CKMB", "CKMBINDEX", "TROPONINI" in the last 168 hours. BNP (last 3 results) No results for input(s): "PROBNP" in the last 8760 hours. HbA1C: No results for input(s): "HGBA1C" in the last 72 hours. CBG:  Recent Labs  Lab 11/27/22 1626 11/27/22 2054 11/28/22 0002 11/28/22 0334 11/28/22 0730  GLUCAP 159* 192* 185* 183* 166*   Lipid Profile: Recent Labs    11/26/22 0439  TRIG 68   Thyroid Function Tests: No results for input(s): "TSH", "T4TOTAL", "FREET4", "T3FREE", "THYROIDAB" in the last 72 hours. Anemia Panel: No results for input(s): "VITAMINB12", "FOLATE", "FERRITIN", "TIBC", "IRON", "RETICCTPCT" in the last 72 hours. Sepsis Labs: No results for input(s): "PROCALCITON", "LATICACIDVEN" in the last 168 hours.  Recent Results (from the past 240 hour(s))  Culture, body fluid w Gram Stain-bottle     Status: None   Collection Time: 11/18/22 11:40 AM   Specimen: Ascitic  Result Value Ref Range Status   Specimen Description ASCITIC  Final   Special Requests BOTTLES DRAWN AEROBIC AND ANAEROBIC 10CC  Final   Culture   Final    NO GROWTH 5 DAYS Performed at [facility], [address], [place], [place] [address]    Report Status 11/23/2022 FINAL  Final  Gram stain     Status: None   Collection Time: 11/18/22 11:40 AM   Specimen: Ascitic  Result Value Ref Range Status   Specimen Description ASCITIC  Final   Special Requests NONE  Final   Gram Stain   Final    NO ORGANISMS SEEN WBC PRESENT, PREDOMINANTLY MONONUCLEAR CYTOSPIN  SMEAR Performed at [facility], [address], [place], [place] [address]    Report Status 11/18/2022 FINAL  Final  MRSA Next Gen by PCR, Nasal     Status: Abnormal   Collection Time: 11/25/22  4:00 PM   Specimen: Nasal Mucosa; Nasal Swab  Result Value Ref Range Status   MRSA by PCR Next Gen DETECTED (A) NOT DETECTED Final    Comment: RESULT CALLED TO, READ BACK BY AND VERIFIED WITH: ELLER,J ON 11/25/22 AT 2255 BY LOY,C (NOTE) The GeneXpert MRSA Assay (FDA approved for NASAL specimens only), is one component of a comprehensive MRSA colonization surveillance program. It is not intended to diagnose MRSA infection nor to guide or monitor treatment for MRSA infections. Test performance is not FDA approved in patients less than 78 years old. Performed at [facility], [address] [place], [place] [address]   Gram stain     Status: None   Collection Time: 11/26/22 11:51 AM   Specimen: Peritoneal Washings  Result Value Ref Range Status   Specimen Description PERITONEAL  Final   Special Requests NONE  Final   Gram Stain   Final    NO ORGANISMS SEEN WBC PRESENT, PREDOMINANTLY MONONUCLEAR CYTOSPIN SMEAR Performed at [facility], [address] [place], [place] [address]    Report Status 11/26/2022 FINAL  Final  Culture, body fluid w Gram Stain-bottle     Status: None (Preliminary result)   Collection Time: 11/26/22 11:51 AM   Specimen: Peritoneal Washings  Result Value Ref Range Status   Specimen Description PERITONEAL  Final   Special Requests BOTTLES DRAWN AEROBIC AND ANAEROBIC 10CC  Final   Culture   Final    NO GROWTH < 12 HOURS Performed at [facility], [address] [place], [place] [address]    Report Status PENDING  Incomplete         Radiology Studies: US Paracentesis  Result Date: 11/26/2022 INDICATION: Cirrhosis secondary to NASH, ascites EXAM: ULTRASOUND GUIDED DIAGNOSTIC AND THERAPEUTIC PARACENTESIS MEDICATIONS: None. COMPLICATIONS: None immediate.  PROCEDURE: Informed written consent was obtained from the patient after a discussion of the risks, benefits and  alternatives to treatment. A timeout was performed prior to the initiation of the procedure. Initial ultrasound scanning demonstrates a large amount of ascites within the right lower abdominal quadrant. The right lower abdomen was prepped and draped in the usual sterile fashion. 1% lidocaine was used for local anesthesia. Following this, a 19 gauge, 7-cm, Yueh catheter was introduced. An ultrasound image was saved for documentation purposes. The paracentesis was performed. The catheter was removed and a dressing was applied. The patient tolerated the procedure well without immediate post procedural complication. Patient received post-procedure intravenous albumin; see nursing notes for details. FINDINGS: A total of approximately 4 L of dark amber colored fluid was removed. Samples were sent to the laboratory as requested by the clinical team. IMPRESSION: Successful ultrasound-guided paracentesis yielding 4 liters of peritoneal fluid. Electronically Signed   By: Lavonia Dana M.D.   On: 11/26/2022 12:48   DG CHEST PORT 1 VIEW  Result Date: 11/26/2022 CLINICAL DATA:  Acute respiratory failure. EXAM: PORTABLE CHEST 1 VIEW COMPARISON:  One-view chest x-ray 11/24/2022 FINDINGS: Heart size is exaggerated by low lung volumes. Progressive pulmonary vascular congestion and mild edema is now present. No definite effusions are present. Chronic elevation of the right hemidiaphragm noted. Visualized soft tissues and bony thorax are otherwise stable. IMPRESSION: Progressive pulmonary vascular congestion and mild edema. Electronically Signed   By: San Morelle M.D.   On: 11/26/2022 09:40        Scheduled Meds:  insulin aspart  0-5 Units Subcutaneous QHS   insulin aspart  0-9 Units Subcutaneous TID WC   insulin aspart  14 Units Subcutaneous TID WC   insulin glargine-yfgn  36 Units Subcutaneous Daily    midodrine  10 mg Oral TID WC   mupirocin ointment  1 Application Nasal BID   mouth rinse  15 mL Mouth Rinse 4 times per day   pantoprazole (PROTONIX) IV  40 mg Intravenous Q12H   sodium chloride flush  3 mL Intravenous Q12H   Continuous Infusions:  sodium chloride 10 mL/hr at 11/26/22 1516   albumin human 50 g (11/28/22 0552)   cefTRIAXone (ROCEPHIN)  IV Stopped (11/27/22 1217)     LOS: 3 days    Time spent: 35 minutes    Matisse Salais Darleen Crocker, DO [facility]  If 7PM-7AM, please contact night-coverage [email or web address] 11/28/2022, 9:29 AM

## 2022-11-28 NOTE — Progress Notes (Signed)
Patient alert and verbal. Tolerated meds whole with no complaints. Patients spouse brought patients home CPAP machine due to patient removing hospitals CPAP machine during the night. Patient reported complaints of pain to back PRN given, see MAR.

## 2022-11-28 NOTE — Progress Notes (Addendum)
[name], M.D. Gastroenterology & Hepatology   Interval History:  No acute events overnight. Patient tolerated oral intake of food without nausea or vomiting.  No episodes of melena or hematochezia.  Hemoglobin has remained relatively stable at 8.3.  Inpatient Medications:  Current Facility-Administered Medications:    0.9 %  sodium chloride infusion, 250 mL, Intravenous, PRN, Rourk, [name], MD, Last Rate: 10 mL/hr at 11/26/22 1516, Infusion Verify at 11/26/22 1516   acetaminophen (TYLENOL) tablet 650 mg, 650 mg, Oral, Q6H PRN **OR** acetaminophen (TYLENOL) suppository 650 mg, 650 mg, Rectal, Q6H PRN, Rourk, [name], MD   albumin human 25 % solution 50 g, 50 g, Intravenous, Q8H, Montez Morita, Quillian Quince, MD, Last Rate: 60 mL/hr at 11/28/22 0552, 50 g at 11/28/22 0552   albuterol (PROVENTIL) (2.5 MG/3ML) 0.083% nebulizer solution 3 mL, 3 mL, Inhalation, Q6H PRN, Rourk, [name], MD   cefTRIAXone (ROCEPHIN) 1 g in sodium chloride 0.9 % 100 mL IVPB, 1 g, Intravenous, Q24H, Rourk, [name], MD, Stopped at 11/27/22 1217   insulin aspart (novoLOG) injection 0-5 Units, 0-5 Units, Subcutaneous, QHS, Rourk, [name], MD, 2 Units at 11/26/22 2144   insulin aspart (novoLOG) injection 0-9 Units, 0-9 Units, Subcutaneous, TID WC, Rourk, [name], MD, 2 Units at 11/28/22 0905   insulin aspart (novoLOG) injection 14 Units, 14 Units, Subcutaneous, TID WC, Rourk, [name], MD, 14 Units at 11/27/22 1142   insulin glargine-yfgn (SEMGLEE) injection 36 Units, 36 Units, Subcutaneous, Daily, Rourk, [name], MD, 36 Units at 11/28/22 0905   midodrine (PROAMATINE) tablet 10 mg, 10 mg, Oral, TID WC, Rourk, [name], MD, 10 mg at 11/28/22 3810   mupirocin ointment (BACTROBAN) 2 % 1 Application, 1 Application, Nasal, BID, Manuella Ghazi, Pratik D, DO, 1 Application at 17/51/02 0906   ondansetron (ZOFRAN) tablet 4 mg, 4 mg, Oral, Q6H PRN **OR** ondansetron (ZOFRAN) injection 4 mg, 4 mg, Intravenous, Q6H PRN, Daneil Dolin, MD   Oral care mouth rinse, 15 mL, Mouth Rinse, 4 times per day, Manuella Ghazi, Pratik D, DO, 15 mL at 11/28/22 0906   Oral care mouth rinse, 15 mL, Mouth Rinse, PRN, Manuella Ghazi, Pratik D, DO   oxyCODONE (Oxy IR/ROXICODONE) immediate release tablet 5 mg, 5 mg, Oral, Q4H PRN, Daneil Dolin, MD, 5 mg at 11/28/22 0152   pantoprazole (PROTONIX) injection 40 mg, 40 mg, Intravenous, Q12H, Rourk, [name], MD, 40 mg at 11/28/22 0903   sodium chloride flush (NS) 0.9 % injection 3 mL, 3 mL, Intravenous, Q12H, Rourk, [name], MD, 3 mL at 11/28/22 0906   sodium chloride flush (NS) 0.9 % injection 3 mL, 3 mL, Intravenous, PRN, Daneil Dolin, MD   I/O    Intake/Output Summary (Last 24 hours) at 11/28/2022 0911 Last data filed at 11/28/2022 0900 Gross per 24 hour  Intake 1420 ml  Output 600 ml  Net 820 ml     Physical Exam: Temp:  [98.1 F (36.7 C)-99.2 F (37.3 C)] 98.1 F (36.7 C) (12/17 0330) Pulse Rate:  [71-77] 74 (12/17 0330) Resp:  [16-24] 20 (12/17 0330) BP: (83-114)/(39-71) 102/58 (12/17 0330) SpO2:  [93 %-100 %] 93 % (12/17 0330)  Temp (24hrs), Avg:98.7 F (37.1 C), Min:98.1 F (36.7 C), Max:99.2 F (37.3 C) GENERAL: The patient is AO x3, in no acute distress. HEENT: Head is normocephalic and atraumatic. EOMI are intact. Mouth is well hydrated and without lesions. NECK: Supple. No masses LUNGS: Clear to auscultation. No presence of rhonchi/wheezing/rales. Adequate chest expansion  HEART: RRR, normal s1 and s2. ABDOMEN: Soft, nontender, no guarding, no peritoneal signs.  Has very mild distention. BS +. No masses. EXTREMITIES: Without any cyanosis, clubbing, rash, lesions or edema. NEUROLOGIC: AOx3, no focal motor deficit.  No asterixis. SKIN: no jaundice, no rashes  Laboratory Data: CBC:     Component Value Date/Time   WBC 3.8 (L) 11/28/2022 0323   RBC 2.60 (L) 11/28/2022 0323   HGB 8.3 (L) 11/28/2022 0323   HGB 12.5 (L) 05/03/2019 1005   HGB 10.1 (L) 12/15/2018 1525   HCT 24.4  (L) 11/28/2022 0323   HCT 30.2 (L) 12/15/2018 1525   PLT 111 (L) 11/28/2022 0323   PLT 72 (L) 05/03/2019 1005   PLT 87 (LL) 12/15/2018 1525   MCV 93.8 11/28/2022 0323   MCV 84 12/15/2018 1525   MCH 31.9 11/28/2022 0323   MCHC 34.0 11/28/2022 0323   RDW 16.2 (H) 11/28/2022 0323   RDW 15.1 12/15/2018 1525   LYMPHSABS 0.7 11/24/2022 0746   LYMPHSABS 1.5 12/15/2018 1525   MONOABS 0.7 11/24/2022 0746   EOSABS 0.1 11/24/2022 0746   EOSABS 0.1 12/15/2018 1525   BASOSABS 0.0 11/24/2022 0746   BASOSABS 0.0 12/15/2018 1525   COAG:  Lab Results  Component Value Date   INR 1.4 (H) 11/27/2022   INR 1.6 (H) 11/26/2022   INR 1.4 (H) 11/25/2022    BMP:     Latest Ref Rng & Units 11/28/2022    3:23 AM 11/27/2022    4:09 AM 11/26/2022    4:39 AM  BMP  Glucose 70 - 99 mg/dL 180  155  161   BUN 8 - 23 mg/dL 30  34  34   Creatinine 0.61 - 1.24 mg/dL 1.47  1.75  1.82   Sodium 135 - 145 mmol/L 131  132  131   Potassium 3.5 - 5.1 mmol/L 3.7  4.1  3.7   Chloride 98 - 111 mmol/L 95  93  94   CO2 22 - 32 mmol/L _0 Calcium 8.9 - 10.3 mg/dL 8.8  8.6  8.8     HEPATIC:     Latest Ref Rng & Units 11/28/2022    3:23 AM 11/27/2022    4:09 AM 11/26/2022    4:39 AM  Hepatic Function  Total Protein 6.5 - 8.1 g/dL 7.1  7.1  6.9   Albumin 3.5 - 5.0 g/dL 4.0  3.2  3.4   AST 15 - 41 U/L _1 ALT 0 - 44 U/L _2 Alk Phosphatase 38 - 126 U/L 51  63  60   Total Bilirubin 0.3 - 1.2 mg/dL 2.6  2.7  2.7     CARDIAC:  Lab Results  Component Value Date   TROPONINI <0.03 05/16/2016      Imaging: I personally reviewed and interpreted the available labs, imaging and endoscopic files.   Assessment/Plan: 61 y.o. male with a history of [name] cirrhosis c/ ascites, HTN, OSA, type 2 diabetes, CAD s/p CABG, HFpEF, mild aortic stenosis, adrenal insufficiency, chronic kidney disease, polyclonal gammopathy, who was admitted to the hospital after presenting worsening weakness and anemia.   Patient had some maroon color stools.  Patient had worsening anemia, for which she underwent an EGD during this hospitalization.  Patient was found to have 4 columns of grade 2/3 varices with no stigmata of bleeding s/p banding x 4, portal hypertensive gastropathy.  He  required to be intubated for his esophagogastroduodenospy but now is extubated and has recovered adequately after his procedure.  Hemoglobin has remained stable after his procedure.  Will need to keep him on PPI twice daily for now and he will need to have a repeat EGD in 4 weeks.  He will need to be restarted on his nadolol 20 mg daily upon discharge.   Regarding his decompensated liver disease, he had presence of recurrent ascites for which he required to have paracentesis with removal of 4 L, this was negative for SBP.  Unfortunately, use of diuretics has been limited by new AKI which has been slowly improving.  He can restart his diuretics tomorrow with close monitoring of his creatinine.  No signs of hepatic encephalopathy. MELD 3.0 today was 21.  The patient will need to follow-up up closely with [facility] transplant hepatology.   - Rocephin PPX for total 5 days - GI soft diet - Pantoprazole 40 mg BID IV - Check H/H daily - Albumin 50 g TID IV - Daily MELD labs - Repeat EGD in 4 weeks -Can restart home diuretics tomorrow -Restart nadolol 20 milligrams daily upon discharge -Continue midodrine 10 mg 3 times daily - Patient will need to follow up with [facility] transplant hepatology soon, will also follow up locally in 2-3 weeks, with possible repeat EGD in 4 weeks. - GI service will sign-off, please call us back if you have any more questions.  Christopher Peppers, MD Gastroenterology and Hepatology [facility]

## 2022-11-29 DIAGNOSIS — R531 Weakness: Secondary | ICD-10-CM | POA: Diagnosis not present

## 2022-11-29 LAB — COMPREHENSIVE METABOLIC PANEL
ALT: 7 U/L (ref 0–44)
AST: 19 U/L (ref 15–41)
Albumin: 4.8 g/dL (ref 3.5–5.0)
Alkaline Phosphatase: 48 U/L (ref 38–126)
Anion gap: 11 (ref 5–15)
BUN: 29 mg/dL — ABNORMAL HIGH (ref 8–23)
CO2: 28 mmol/L (ref 22–32)
Calcium: 9.1 mg/dL (ref 8.9–10.3)
Chloride: 94 mmol/L — ABNORMAL LOW (ref 98–111)
Creatinine, Ser: 1.52 mg/dL — ABNORMAL HIGH (ref 0.61–1.24)
GFR, Estimated: 51 mL/min — ABNORMAL LOW (ref >60)
Glucose, Bld: 175 mg/dL — ABNORMAL HIGH (ref 70–99)
Potassium: 3.7 mmol/L (ref 3.5–5.1)
Sodium: 133 mmol/L — ABNORMAL LOW (ref 135–145)
Total Bilirubin: 3.2 mg/dL — ABNORMAL HIGH (ref 0.3–1.2)
Total Protein: 7.6 g/dL (ref 6.5–8.1)

## 2022-11-29 LAB — GLUCOSE, CAPILLARY
Glucose-Capillary: 168 mg/dL — ABNORMAL HIGH (ref 70–99)
Glucose-Capillary: 180 mg/dL — ABNORMAL HIGH (ref 70–99)
Glucose-Capillary: 180 mg/dL — ABNORMAL HIGH (ref 70–99)
Glucose-Capillary: 212 mg/dL — ABNORMAL HIGH (ref 70–99)
Glucose-Capillary: 250 mg/dL — ABNORMAL HIGH (ref 70–99)
Glucose-Capillary: 298 mg/dL — ABNORMAL HIGH (ref 70–99)
Glucose-Capillary: 321 mg/dL — ABNORMAL HIGH (ref 70–99)

## 2022-11-29 LAB — CBC
HCT: 27 % — ABNORMAL LOW (ref 39.0–52.0)
Hemoglobin: 8.9 g/dL — ABNORMAL LOW (ref 13.0–17.0)
MCH: 31.4 pg (ref 26.0–34.0)
MCHC: 33 g/dL (ref 30.0–36.0)
MCV: 95.4 fL (ref 80.0–100.0)
Platelets: 112 10*3/uL — ABNORMAL LOW (ref 150–400)
RBC: 2.83 MIL/uL — ABNORMAL LOW (ref 4.22–5.81)
RDW: 16.1 % — ABNORMAL HIGH (ref 11.5–15.5)
WBC: 4 10*3/uL (ref 4.0–10.5)
nRBC: 0 % (ref 0.0–0.2)

## 2022-11-29 LAB — PROTIME-INR
INR: 1.7 — ABNORMAL HIGH (ref 0.8–1.2)
Prothrombin Time: 19.4 seconds — ABNORMAL HIGH (ref 11.4–15.2)

## 2022-11-29 LAB — MAGNESIUM: Magnesium: 2.2 mg/dL (ref 1.7–2.4)

## 2022-11-29 MED ORDER — SODIUM CHLORIDE 0.9 % IV SOLN
1.0000 g | INTRAVENOUS | Status: AC
  Administered 2022-11-29: 1 g via INTRAVENOUS
  Filled 2022-11-29: qty 10

## 2022-11-29 MED ORDER — LACTULOSE 10 GM/15ML PO SOLN
20.0000 g | Freq: Every day | ORAL | Status: DC
  Administered 2022-11-29 – 2022-11-30 (×2): 20 g via ORAL
  Filled 2022-11-29 (×2): qty 30

## 2022-11-29 MED ORDER — SPIRONOLACTONE 25 MG PO TABS
100.0000 mg | ORAL_TABLET | Freq: Every day | ORAL | Status: DC
  Administered 2022-11-29: 100 mg via ORAL
  Filled 2022-11-29 (×2): qty 4

## 2022-11-29 MED ORDER — TORSEMIDE 20 MG PO TABS
60.0000 mg | ORAL_TABLET | Freq: Two times a day (BID) | ORAL | Status: DC
  Administered 2022-11-29 (×2): 60 mg via ORAL
  Filled 2022-11-29 (×3): qty 3

## 2022-11-29 NOTE — Progress Notes (Signed)
Patient requested pain medication at beginning of shift. Roxicodone given at 2007 for shoulder pain rated at a 9. Patient slept the rest of the night after taking night time medications. Continued to monitor patient.

## 2022-11-29 NOTE — Progress Notes (Signed)
Physical Therapy Treatment Patient Details Name: [name] MRN: [id number] DOB: 1961-03-02 Today's Date: 11/29/2022   History of Present Illness [name] is a 61 y.o. male with medical history significant for [name] cirrhosis with ascites, chronic kidney disease stage IIIa, obstructive sleep apnea on CPAP, type 2 diabetes with nephropathy, anemia of chronic kidney disease, diastolic heart failure and class II obesity who was just recently discharged from the hospital on 12/12 with treatment of a ascites with paracentesis after he presented for generalized weakness and increased weight gain and shortness of breath.  He had 4 L of fluid removed on 12/7 and appears to require another paracentesis and is complaining again of significant weakness and inability to ambulate at home.    PT Comments    Pt very slow with motion.  Pt needs mod assist for bed mobility max for transfer.  Pt will benefit from skilled PT to improve his functioning ability at this time.   Recommendations for follow up therapy are one component of a multi-disciplinary discharge planning process, led by the attending physician.  Recommendations may be updated based on patient status, additional functional criteria and insurance authorization.  Follow Up Recommendations  Skilled nursing-short term rehab (<3 hours/day) Can patient physically be transported by private vehicle: No   Assistance Recommended at Discharge Set up Supervision/Assistance  Patient can return home with the following A lot of help with bathing/dressing/bathroom;A lot of help with walking and/or transfers;Help with stairs or ramp for entrance;Assistance with cooking/housework   Equipment Recommendations  None recommended by PT    Recommendations for Other Services       Precautions / Restrictions Precautions Precautions: Fall Restrictions Weight Bearing Restrictions: No     Mobility  Bed Mobility Overal bed mobility: Needs  Assistance Bed Mobility: Rolling, Sidelying to Sit, Sit to Sidelying Rolling: Min assist Sidelying to sit: Mod assist Supine to sit: Mod assist   Sit to sidelying: Mod assist General bed mobility comments: slow labored movement, discomfort in abdomen    Transfers Overall transfer level: Needs assistance Equipment used: Rolling walker (2 wheels) Transfers: Sit to/from Stand Sit to Stand: Max assist (unable to stand.)           General transfer comment: Pt unable to stand worked on scooting to head of bed.  PT fatigued after treatment          Cognition Arousal/Alertness: Awake/alert Behavior During Therapy: WFL for tasks assessed/performed Overall Cognitive Status: Within Functional Limits for tasks assessed                                                 Pertinent Vitals/Pain Pain Assessment Pain Assessment: No/denies pain    Home Living Family/patient expects to be discharged to:: Private residence Living Arrangements: Spouse/significant other Available Help at Discharge: Family;Available 24 hours/day Type of Home: House Home Access: Stairs to enter Entrance Stairs-Rails: Left Entrance Stairs-Number of Steps: 3   Home Layout: One level Home Equipment: Conservation officer, nature (2 wheels);Cane - single point;Cane - quad      Prior Function            PT Goals (current goals can now be found in the care plan section)      Frequency    Min 3X/week      PT Plan      Co-evaluation  AM-PAC PT "6 Clicks" Mobility   Outcome Measure                   End of Session Equipment Utilized During Treatment: Gait belt Activity Tolerance: Patient limited by fatigue Patient left: in bed;with call bell/phone within reach Nurse Communication: Mobility status PT Visit Diagnosis: Unsteadiness on feet (R26.81);Other abnormalities of gait and mobility (R26.89);Muscle weakness (generalized) (M62.81)     Time: 0979-4997 PT  Time Calculation (min) (ACUTE ONLY): 20 min  Charges:  $Therapeutic Exercise: 8-22 mins                    Rayetta Humphrey, PT CLT [phone]  11/29/2022, 2:22 PM

## 2022-11-29 NOTE — Progress Notes (Signed)
PROGRESS NOTE    [name]  WCH:[id number] DOB: 01-Oct-1961 DOA: 11/24/2022 PCP: [name], MD   Brief Narrative:    [name] is a 61 y.o. male with medical history significant for [name] cirrhosis with ascites, chronic kidney disease stage IIIa, obstructive sleep apnea on CPAP, type 2 diabetes with nephropathy, anemia of chronic kidney disease, diastolic heart failure and class II obesity who was just recently discharged from the hospital on 12/12 with treatment of a ascites with paracentesis after he presented for generalized weakness and increased weight gain and shortness of breath.  He had 4 L of fluid removed on 12/7 and appears to require another paracentesis and is complaining again of significant weakness and inability to ambulate at home.   He was noted to have GI bleeding with worsening anemia on 12/14 and underwent EGD with esophageal varices noted and had some banding.  No stigmata of bleeding was noted, however he returned intubated and on ventilator.  He was subsequently extubated on 12/15 and did undergo 4 L paracentesis as well.  He is currently awaiting SNF placement.   Assessment & Plan:   Principal Problem:   Weakness Active Problems:   Essential hypertension   Hyperglycemia   S/P CABG x 4   OSA on CPAP   NASH cirrhosis of liver (HCC)   Anasarca   Class 2 obesity   Hyponatremia   CKD stage 3 due to type 2 diabetes mellitus (HCC)   Anemia   GI bleed   CAD (coronary artery disease)   HLD (hyperlipidemia)   Chronic idiopathic thrombocytopenia (HCC)   Decompensated hepatic cirrhosis (HCC)   Ascites  Assessment and Plan:   Acute blood loss anemia secondary to GI bleed-stable -Status post EGD with esophageal varices and banding performed -Now extubated and started on clear liquid diet -Rocephin 1 g daily for 5 days day 5/5 -PPI twice daily -Monitor CBC -Currently on soft diet   Generalized weakness in the setting of recurrent ascites  due to NASH cirrhosis-decompensated cirrhosis - plan to give albumin for now and await paracentesis once abdomen is more tense and plan to do 4 L paracentesis as needed -PT recommending SNF, awaiting placement -4 L paracentesis performed on 12/15 -Albumin 50 mg 3 times daily per GI -Resume spironolactone and torsemide 12/18   Mild hyponatremia-stable -Likely due to volume overload -Monitor closely   Chronic diastolic CHF -No indication for exacerbation at this time, monitor   Stage II pressure injury to sacrum -Reposition and keep dry -Wound care   Dyslipidemia -continue statin and monitor LFTs   Type 2 diabetes with hyperglycemia -semglee 36 units daily -Novolog 14 units TID -SSI 4 hours and holding diet for now -Appreciate diabetes coordinator   AKI on CKD stage IIIa-improving -Continue to monitor -Avoid nephrotoxic agents -Holding diuretics for now   GERD -PPI   Nash liver cirrhosis -LFTs currently stable -MELD score of 24 on 12/14   OSA on CPAP -PAP at bedtime now that patient is extubated   Status post CABG x 4 -Hold home medications for now   Physical deconditioning -PT recommending SNF   Class II obesity -BMI 35.68     DVT prophylaxis: SCDs Code Status: Full Family Communication: None at bedside Disposition Plan:  Status is: Inpatient Remains inpatient appropriate because: Need for mechanical ventilation, IV medications.     Consultants:  GI Pulmonology   Procedures:  EGD with variceal banding 12/14 and subsequent intubation Extubation 12/15 4 L paracentesis 12/15  Antimicrobials:  Anti-infectives (From admission, onward)    Start     Dose/Rate Route Frequency Ordered Stop   11/25/22 1215  cefTRIAXone (ROCEPHIN) 1 g in sodium chloride 0.9 % 100 mL IVPB        1 g 200 mL/hr over 30 Minutes Intravenous Every 24 hours 11/25/22 1129         Subjective: Patient seen and evaluated today with no new acute complaints or concerns. No  acute concerns or events noted overnight.  Objective: Vitals:   11/28/22 0330 11/28/22 1535 11/28/22 2124 11/29/22 0427  BP: (!) 102/58 120/61 103/60 (!) 95/56  Pulse: 74 79 80 76  Resp: 20 18 20 20   Temp: 98.1 F (36.7 C) 98.1 F (36.7 C) 98.3 F (36.8 C) 98.3 F (36.8 C)  TempSrc:  Oral    SpO2: 93% 97% 92% 99%  Weight:      Height:        Intake/Output Summary (Last 24 hours) at 11/29/2022 0928 Last data filed at 11/29/2022 0431 Gross per 24 hour  Intake 515.97 ml  Output 1100 ml  Net -584.03 ml   Filed Weights   11/24/22 2329 11/26/22 0604  Weight: 114.4 kg 111.5 kg    Examination:  General exam: Appears calm and comfortable  Respiratory system: Clear to auscultation. Respiratory effort normal. Cardiovascular system: S1 & S2 heard, RRR.  Gastrointestinal system: Abdomen is soft, minimally distended Central nervous system: Alert and awake Extremities: No edema Skin: No significant lesions noted Psychiatry: Flat affect.    Data Reviewed: I have personally reviewed following labs and imaging studies  CBC: Recent Labs  Lab 11/24/22 0746 11/25/22 0312 11/25/22 0839 11/26/22 0439 11/27/22 0409 11/28/22 0323 11/29/22 0437  WBC 4.1 3.3*  --  4.0 4.7 3.8* 4.0  NEUTROABS 2.5  --   --   --   --   --   --   HGB 9.9* 7.9* 7.6* 8.0* 9.5* 8.3* 8.9*  HCT 29.4* 23.7* 23.0* 24.3* 28.9* 24.4* 27.0*  MCV 95.5 94.8  --  95.3 97.6 93.8 95.4  PLT 142* 107*  --  112* 157 111* 027*   Basic Metabolic Panel: Recent Labs  Lab 11/25/22 0312 11/26/22 0439 11/27/22 0409 11/28/22 0323 11/29/22 0437  NA 129* 131* 132* 131* 133*  K 4.2 3.7 4.1 3.7 3.7  CL 93* 94* 93* 95* 94*  CO2 27 25 27 27 28   GLUCOSE 190* 161* 155* 180* 175*  BUN 30* 34* 34* 30* 29*  CREATININE 1.54* 1.82* 1.75* 1.47* 1.52*  CALCIUM 9.0 8.8* 8.6* 8.8* 9.1  MG 2.1 1.9 2.1 2.1 2.2   GFR: Estimated Creatinine Clearance: 62.7 mL/min (A) (by C-G formula based on SCr of 1.52 mg/dL (H)). Liver  Function Tests: Recent Labs  Lab 11/25/22 0312 11/26/22 0439 11/27/22 0409 11/28/22 0323 11/29/22 0437  AST 22 22 20 19 19   ALT 9 8 9 8 7   ALKPHOS 61 60 63 51 48  BILITOT 2.9* 2.7* 2.7* 2.6* 3.2*  PROT 7.3 6.9 7.1 7.1 7.6  ALBUMIN 3.6 3.4* 3.2* 4.0 4.8   No results for input(s): "LIPASE", "AMYLASE" in the last 168 hours. Recent Labs  Lab 11/25/22 0312 11/26/22 0439 11/27/22 0409  AMMONIA 64* 58* 32   Coagulation Profile: Recent Labs  Lab 11/25/22 0312 11/26/22 0439 11/27/22 0409 11/29/22 0437  INR 1.4* 1.6* 1.4* 1.7*   Cardiac Enzymes: No results for input(s): "CKTOTAL", "CKMB", "CKMBINDEX", "TROPONINI" in the last 168 hours. BNP (last 3 results) No  results for input(s): "PROBNP" in the last 8760 hours. HbA1C: No results for input(s): "HGBA1C" in the last 72 hours. CBG: Recent Labs  Lab 11/28/22 1625 11/28/22 2127 11/29/22 0025 11/29/22 0427 11/29/22 0732  GLUCAP 289* 169* 168* 180* 180*   Lipid Profile: No results for input(s): "CHOL", "HDL", "LDLCALC", "TRIG", "CHOLHDL", "LDLDIRECT" in the last 72 hours. Thyroid Function Tests: No results for input(s): "TSH", "T4TOTAL", "FREET4", "T3FREE", "THYROIDAB" in the last 72 hours. Anemia Panel: No results for input(s): "VITAMINB12", "FOLATE", "FERRITIN", "TIBC", "IRON", "RETICCTPCT" in the last 72 hours. Sepsis Labs: No results for input(s): "PROCALCITON", "LATICACIDVEN" in the last 168 hours.  Recent Results (from the past 240 hour(s))  MRSA Next Gen by PCR, Nasal     Status: Abnormal   Collection Time: 11/25/22  4:00 PM   Specimen: Nasal Mucosa; Nasal Swab  Result Value Ref Range Status   MRSA by PCR Next Gen DETECTED (A) NOT DETECTED Final    Comment: RESULT CALLED TO, READ BACK BY AND VERIFIED WITH: ELLER,J ON 11/25/22 AT 2255 BY LOY,C (NOTE) The GeneXpert MRSA Assay (FDA approved for NASAL specimens only), is one component of a comprehensive MRSA colonization surveillance program. It is not intended  to diagnose MRSA infection nor to guide or monitor treatment for MRSA infections. Test performance is not FDA approved in patients less than 38 years old. Performed at [facility], [address] [place], [place] [address]   Gram stain     Status: None   Collection Time: 11/26/22 11:51 AM   Specimen: Peritoneal Washings  Result Value Ref Range Status   Specimen Description PERITONEAL  Final   Special Requests NONE  Final   Gram Stain   Final    NO ORGANISMS SEEN WBC PRESENT, PREDOMINANTLY MONONUCLEAR CYTOSPIN SMEAR Performed at [facility], [address], [place], [place] [address]    Report Status 11/26/2022 FINAL  Final  Culture, body fluid w Gram Stain-bottle     Status: None (Preliminary result)   Collection Time: 11/26/22 11:51 AM   Specimen: Peritoneal Washings  Result Value Ref Range Status   Specimen Description PERITONEAL  Final   Special Requests BOTTLES DRAWN AEROBIC AND ANAEROBIC 10CC  Final   Culture   Final    NO GROWTH 3 DAYS Performed at [facility], [address] [place], [place] [address]    Report Status PENDING  Incomplete         Radiology Studies: No results found.      Scheduled Meds:  insulin aspart  0-5 Units Subcutaneous QHS   insulin aspart  0-9 Units Subcutaneous TID WC   insulin aspart  14 Units Subcutaneous TID WC   insulin glargine-yfgn  36 Units Subcutaneous Daily   midodrine  10 mg Oral TID WC   mupirocin ointment  1 Application Nasal BID   mouth rinse  15 mL Mouth Rinse 4 times per day   pantoprazole (PROTONIX) IV  40 mg Intravenous Q12H   sodium chloride flush  3 mL Intravenous Q12H   spironolactone  100 mg Oral Daily   torsemide  60 mg Oral BID   Continuous Infusions:  sodium chloride 10 mL/hr at 11/26/22 1516   albumin human 50 g (11/29/22 0532)   cefTRIAXone (ROCEPHIN)  IV Stopped (11/28/22 1236)     LOS: 4 days    Time spent: 35 minutes    Christopher Dia Darleen Crocker, DO [facility]  If 7PM-7AM, please contact  night-coverage [email or web address] 11/29/2022, 9:28 AM

## 2022-11-30 DIAGNOSIS — K7290 Hepatic failure, unspecified without coma: Secondary | ICD-10-CM | POA: Diagnosis not present

## 2022-11-30 DIAGNOSIS — H5703 Miosis: Secondary | ICD-10-CM | POA: Diagnosis not present

## 2022-11-30 DIAGNOSIS — H52223 Regular astigmatism, bilateral: Secondary | ICD-10-CM | POA: Diagnosis not present

## 2022-11-30 DIAGNOSIS — R293 Abnormal posture: Secondary | ICD-10-CM | POA: Diagnosis not present

## 2022-11-30 DIAGNOSIS — I2510 Atherosclerotic heart disease of native coronary artery without angina pectoris: Secondary | ICD-10-CM | POA: Diagnosis not present

## 2022-11-30 DIAGNOSIS — E669 Obesity, unspecified: Secondary | ICD-10-CM | POA: Diagnosis not present

## 2022-11-30 DIAGNOSIS — K7469 Other cirrhosis of liver: Secondary | ICD-10-CM | POA: Diagnosis not present

## 2022-11-30 DIAGNOSIS — R5381 Other malaise: Secondary | ICD-10-CM | POA: Diagnosis not present

## 2022-11-30 DIAGNOSIS — D693 Immune thrombocytopenic purpura: Secondary | ICD-10-CM | POA: Diagnosis not present

## 2022-11-30 DIAGNOSIS — E785 Hyperlipidemia, unspecified: Secondary | ICD-10-CM | POA: Diagnosis not present

## 2022-11-30 DIAGNOSIS — R188 Other ascites: Secondary | ICD-10-CM | POA: Diagnosis not present

## 2022-11-30 DIAGNOSIS — R269 Unspecified abnormalities of gait and mobility: Secondary | ICD-10-CM | POA: Diagnosis not present

## 2022-11-30 DIAGNOSIS — K922 Gastrointestinal hemorrhage, unspecified: Secondary | ICD-10-CM | POA: Diagnosis not present

## 2022-11-30 DIAGNOSIS — K7581 Nonalcoholic steatohepatitis (NASH): Secondary | ICD-10-CM | POA: Diagnosis not present

## 2022-11-30 DIAGNOSIS — Z7401 Bed confinement status: Secondary | ICD-10-CM | POA: Diagnosis not present

## 2022-11-30 DIAGNOSIS — M6281 Muscle weakness (generalized): Secondary | ICD-10-CM | POA: Diagnosis not present

## 2022-11-30 DIAGNOSIS — H2513 Age-related nuclear cataract, bilateral: Secondary | ICD-10-CM | POA: Diagnosis not present

## 2022-11-30 DIAGNOSIS — R531 Weakness: Secondary | ICD-10-CM | POA: Diagnosis not present

## 2022-11-30 DIAGNOSIS — R279 Unspecified lack of coordination: Secondary | ICD-10-CM | POA: Diagnosis not present

## 2022-11-30 LAB — BASIC METABOLIC PANEL
Anion gap: 12 (ref 5–15)
BUN: 33 mg/dL — ABNORMAL HIGH (ref 8–23)
CO2: 26 mmol/L (ref 22–32)
Calcium: 9.2 mg/dL (ref 8.9–10.3)
Chloride: 95 mmol/L — ABNORMAL LOW (ref 98–111)
Creatinine, Ser: 1.7 mg/dL — ABNORMAL HIGH (ref 0.61–1.24)
GFR, Estimated: 45 mL/min — ABNORMAL LOW (ref >60)
Glucose, Bld: 211 mg/dL — ABNORMAL HIGH (ref 70–99)
Potassium: 3.4 mmol/L — ABNORMAL LOW (ref 3.5–5.1)
Sodium: 133 mmol/L — ABNORMAL LOW (ref 135–145)

## 2022-11-30 LAB — GLUCOSE, CAPILLARY
Glucose-Capillary: 223 mg/dL — ABNORMAL HIGH (ref 70–99)
Glucose-Capillary: 189 mg/dL — ABNORMAL HIGH (ref 70–99)
Glucose-Capillary: 350 mg/dL — ABNORMAL HIGH (ref 70–99)

## 2022-11-30 LAB — CYTOLOGY - NON PAP

## 2022-11-30 LAB — MAGNESIUM: Magnesium: 2 mg/dL (ref 1.7–2.4)

## 2022-11-30 MED ORDER — ALPRAZOLAM 0.25 MG PO TABS: 0.2500 mg | ORAL_TABLET | Freq: Every day | ORAL | 0 refills | Status: DC

## 2022-11-30 MED ORDER — OXYCODONE HCL 5 MG PO TABS: 5.0000 mg | ORAL_TABLET | ORAL | 0 refills | Status: DC | PRN

## 2022-11-30 MED ORDER — INSULIN ASPART 100 UNIT/ML IJ SOLN: 0.0000 [IU] | Freq: Every day | INTRAMUSCULAR | Status: DC

## 2022-11-30 MED ORDER — POTASSIUM CHLORIDE CRYS ER 20 MEQ PO TBCR
40.0000 meq | EXTENDED_RELEASE_TABLET | Freq: Once | ORAL | Status: AC
  Administered 2022-11-30: 40 meq via ORAL
  Filled 2022-11-30: qty 2

## 2022-11-30 MED ORDER — PANTOPRAZOLE SODIUM 40 MG PO TBEC
40.0000 mg | DELAYED_RELEASE_TABLET | Freq: Two times a day (BID) | ORAL | Status: DC
  Administered 2022-11-30: 40 mg via ORAL
  Filled 2022-11-30: qty 1

## 2022-11-30 MED ORDER — INSULIN ASPART 100 UNIT/ML IJ SOLN
0.0000 [IU] | Freq: Three times a day (TID) | INTRAMUSCULAR | Status: DC
  Administered 2022-11-30: 11 [IU] via SUBCUTANEOUS

## 2022-11-30 NOTE — Consult Note (Signed)
   [facility] Inpatient Consult   11/30/2022  [name] [birth year] [id number]  [place] Organization [ACO] Patient: [name] Liaison remote coverage review for patient admitted to [facility]    Primary Care Provider:  [name], MD with [facility]   Patient screened for less than 7 days readmission hospitalization with noted extreme high risk score for unplanned readmission to assess for potential [place] [facility] service needs for post hospital transition for care coordination.  Review of patient's electronic medical record reveals patient is for a skilled nursing facility level of care.   Plan:  Referral request for community care coordination:  Patient transition to [facility]. Needs to be met at SNF for transition from the hospital.  For questions contact:   Natividad Brood, RN BSN [place]  [phone] business mobile phone Toll free office [phone]  *[place]  [phone] Fax number: [phone] [email or web address]

## 2022-11-30 NOTE — TOC Transition Note (Signed)
Transition of Care [facility]) - CM/SW Discharge Note   Patient Details  Name: [name] MRN: [id number] Date of Birth: 08/21/1961  Transition of Care [facility] CM/SW Contact:  Boneta Lucks, RN Phone Number: 11/30/2022, 1:07 PM   Clinical Narrative:   [name] received [facility] [place] provided bed number, RN will call report. EMS scheduled for 2:30 at [place] request. Wife updated. DC summary sent in the hub.     Barriers to Discharge: Continued Medical Work up   Patient Goals and CMS Choice Patient states their goals for this hospitalization and ongoing recovery are:: rehab   Choice offered to / list presented to : Patient  Discharge Placement                       Discharge Plan and Services In-house Referral: Clinical Social Work   Post Acute Care Choice: [place]              Readmission Risk Interventions    11/22/2022    8:40 AM 08/25/2021    2:25 PM 07/22/2021   11:48 AM  Readmission Risk Prevention Plan  Transportation Screening Complete Complete Complete  HRI or Home Care Consult   Complete  Social Work Consult for [place] Planning/Counseling   Complete  Palliative Care Screening   Not Applicable  Medication Review Press photographer) Complete Complete Complete  HRI or Home Care Consult Complete Complete   SW Recovery Care/Counseling Consult Complete Complete   Palliative Care Screening Not Applicable Not Applicable   Skilled Nursing Facility Not Applicable Complete

## 2022-11-30 NOTE — Discharge Summary (Signed)
Physician Discharge Summary  KOHNER ORLICK NLG:[id number] DOB: January 06, 1961 DOA: 11/24/2022  PCP: [name], MD  Admit date: 11/24/2022  Discharge date: 11/30/2022  Admitted From:Home  Disposition:  SNF  Recommendations for Outpatient Follow-up:  Follow up with PCP in 1-2 weeks Continue on medications as noted below Patient will need to follow-up with [facility] transplant hepatology soon and will follow-up with local GI service in 2-3 weeks with consideration for repeat EGD in 4 weeks  Home Health: None  Equipment/Devices: None  Discharge Condition:Stable  CODE STATUS: Full  Diet recommendation: Heart Healthy/carb modified  Brief/Interim Summary: PANCHO RUSHING is a 61 y.o. male with medical history significant for [name] cirrhosis with ascites, chronic kidney disease stage IIIa, obstructive sleep apnea on CPAP, type 2 diabetes with nephropathy, anemia of chronic kidney disease, diastolic heart failure and class II obesity who was just recently discharged from the hospital on 12/12 with treatment of a ascites with paracentesis after he presented for generalized weakness and increased weight gain and shortness of breath.  He had 4 L of fluid removed on 12/7 and appears to require another paracentesis and is complaining again of significant weakness and inability to ambulate at home.   He was noted to have GI bleeding with worsening anemia on 12/14 and underwent EGD with esophageal varices noted and had some banding.  No stigmata of bleeding was noted, however he returned intubated and on ventilator.  He was subsequently extubated on 12/15 and did undergo 4 L paracentesis as well.  He has overall been doing quite well and is now in stable condition for discharge to SNF.  Discharge Diagnoses:  Principal Problem:   Weakness Active Problems:   Essential hypertension   Hyperglycemia   S/P CABG x 4   OSA on CPAP   NASH cirrhosis of liver (HCC)   Anasarca   Class 2 obesity    Hyponatremia   CKD stage 3 due to type 2 diabetes mellitus (HCC)   Anemia   GI bleed   CAD (coronary artery disease)   HLD (hyperlipidemia)   Chronic idiopathic thrombocytopenia (HCC)   Decompensated hepatic cirrhosis (HCC)   Ascites  Principal discharge diagnosis: Generalized weakness in the setting of recurrent ascites due to NASH cirrhosis with decompensated cirrhosis as well as acute blood loss anemia secondary to likely variceal bleed-resolved  Discharge Instructions  Discharge Instructions     Diet - low sodium heart healthy   Complete by: As directed    Increase activity slowly   Complete by: As directed    No wound care   Complete by: As directed       Allergies as of 11/30/2022       Reactions   Fluticasone Rash        Medication List     TAKE these medications    acetaminophen 325 MG tablet Commonly known as: TYLENOL Take 650 mg by mouth every 6 (six) hours as needed for mild pain.   albuterol 108 (90 Base) MCG/ACT inhaler Commonly known as: VENTOLIN HFA Inhale 2 puffs into the lungs every 6 (six) hours as needed for wheezing or shortness of breath.   ALPRAZolam 0.25 MG tablet Commonly known as: XANAX Take 1 tablet (0.25 mg total) by mouth daily.   Armodafinil 50 MG tablet Take 1 tablet (50 mg total) by mouth as needed.   aspirin EC 81 MG tablet Take 1 tablet (81 mg total) by mouth every evening.   Dexcom G5 Receiver Kit [employer] Use  to check/monitor sugar.   Dialyvite Vitamin D 5000 125 MCG (5000 UT) capsule Generic drug: Cholecalciferol Take 5,000 Units by mouth in the morning.   FreeStyle Libre 14 Day Sensor Misc SMARTSIG:1 Each Topical Every 2 Weeks   insulin aspart 100 UNIT/ML injection Commonly known as: novoLOG Inject 5 Units into the skin 3 (three) times daily before meals. Special Instructions: If accu-check is greater than 150. Hold for accu-check 150 or below. With Meals   insulin degludec 100 UNIT/ML FlexTouch Pen Commonly known  as: TRESIBA Inject 52 Units into the skin daily.   lactulose 10 GM/15ML solution Commonly known as: CHRONULAC Take 30 mLs (20 g total) by mouth daily.   loratadine 10 MG tablet Commonly known as: CLARITIN Take 10 mg by mouth in the morning.   metolazone 2.5 MG tablet Commonly known as: ZAROXOLYN Take 1 tablet (2.5 mg total) by mouth once a week. Every Wedenesday   midodrine 10 MG tablet Commonly known as: PROAMATINE Take 1 tablet (10 mg total) by mouth 3 (three) times daily with meals.   nadolol 20 MG tablet Commonly known as: CORGARD Take 1 tablet (20 mg total) by mouth daily.   nitroGLYCERIN 0.4 MG SL tablet Commonly known as: NITROSTAT Place 1 tablet (0.4 mg total) under the tongue every 5 (five) minutes as needed for chest pain. Do not exceed 3 doses in 15 mins   oxyCODONE 5 MG immediate release tablet Commonly known as: Oxy IR/ROXICODONE Take 1 tablet (5 mg total) by mouth every 4 (four) hours as needed for moderate pain.   pantoprazole 40 MG tablet Commonly known as: PROTONIX Take 1 tablet (40 mg total) by mouth daily.   potassium chloride SA 20 MEQ tablet Commonly known as: KLOR-CON M TAKE 1 TABLET(20 MEQ) BY MOUTH DAILY   pravastatin 20 MG tablet Commonly known as: PRAVACHOL TAKE 1 TABLET(20 MG) BY MOUTH AT BEDTIME   spironolactone 100 MG tablet Commonly known as: ALDACTONE Take 1 tablet (100 mg total) by mouth daily.   torsemide 20 MG tablet Commonly known as: DEMADEX Take 3 tablets (60 mg total) by mouth 2 (two) times daily.   venlafaxine XR 75 MG 24 hr capsule Commonly known as: EFFEXOR-XR Take 1 capsule (75 mg total) by mouth daily with breakfast.        Contact information for follow-up providers     [name], MD. Schedule an appointment as soon as possible for a visit in 1 week(s).   Specialty: Family Medicine Contact information: [address] [place] [address] [phone]              Contact information for  after-discharge care     Destination     [facility] .   Service: Skilled Nursing Contact information: [place] [address] [phone]                    Allergies  Allergen Reactions   Fluticasone Rash    Consultations: GI   Procedures/Studies: US Paracentesis  Result Date: 11/26/2022 INDICATION: Cirrhosis secondary to NASH, ascites EXAM: ULTRASOUND GUIDED DIAGNOSTIC AND THERAPEUTIC PARACENTESIS MEDICATIONS: None. COMPLICATIONS: None immediate. PROCEDURE: Informed written consent was obtained from the patient after a discussion of the risks, benefits and alternatives to treatment. A timeout was performed prior to the initiation of the procedure. Initial ultrasound scanning demonstrates a large amount of ascites within the right lower abdominal quadrant. The right lower abdomen was prepped  and draped in the usual sterile fashion. 1% lidocaine was used for local anesthesia. Following this, a 19 gauge, 7-cm, Yueh catheter was introduced. An ultrasound image was saved for documentation purposes. The paracentesis was performed. The catheter was removed and a dressing was applied. The patient tolerated the procedure well without immediate post procedural complication. Patient received post-procedure intravenous albumin; see nursing notes for details. FINDINGS: A total of approximately 4 L of dark amber colored fluid was removed. Samples were sent to the laboratory as requested by the clinical team. IMPRESSION: Successful ultrasound-guided paracentesis yielding 4 liters of peritoneal fluid. Electronically Signed   By: Lavonia Dana M.D.   On: 11/26/2022 12:48   DG CHEST PORT 1 VIEW  Result Date: 11/26/2022 CLINICAL DATA:  Acute respiratory failure. EXAM: PORTABLE CHEST 1 VIEW COMPARISON:  One-view chest x-ray 11/24/2022 FINDINGS: Heart size is exaggerated by low lung volumes. Progressive pulmonary  vascular congestion and mild edema is now present. No definite effusions are present. Chronic elevation of the right hemidiaphragm noted. Visualized soft tissues and bony thorax are otherwise stable. IMPRESSION: Progressive pulmonary vascular congestion and mild edema. Electronically Signed   By: San Morelle M.D.   On: 11/26/2022 09:40   DG Chest Port 1 View  Result Date: 11/24/2022 CLINICAL DATA:  Unable to get up, week EXAM: PORTABLE CHEST 1 VIEW COMPARISON:  10/18/2021 FINDINGS: Mild bilateral interstitial thickening. No focal consolidation. No pleural effusion or pneumothorax. Heart and mediastinal contours are unremarkable. Prior CABG. No acute osseous abnormality. IMPRESSION: 1. Mild pulmonary vascular congestion. Electronically Signed   By: Kathreen Devoid M.D.   On: 11/24/2022 09:33   US Paracentesis  Result Date: 11/18/2022 INDICATION: Recurrent ascites; NASH Cirrhosis EXAM: ULTRASOUND GUIDED RLQ PARACENTESIS MEDICATIONS: 10 cc 1% lidocaine. COMPLICATIONS: None immediate. PROCEDURE: Informed written consent was obtained from the patient after a discussion of the risks, benefits and alternatives to treatment. A timeout was performed prior to the initiation of the procedure. Initial ultrasound scanning demonstrates a large amount of ascites within the right lower abdominal quadrant. The right lower abdomen was prepped and draped in the usual sterile fashion. 1% lidocaine was used for local anesthesia. Following this, a 7 cm Yueh catheter was introduced. An ultrasound image was saved for documentation purposes. The paracentesis was performed. The catheter was removed and a dressing was applied. The patient tolerated the procedure well without immediate post procedural complication. Patient received post-procedure intravenous albumin; see nursing notes for details. FINDINGS: A total of approximately 4 liters of dark yellow fluid was removed. Samples were sent to the laboratory as requested by the  clinical team. IMPRESSION: Successful ultrasound-guided paracentesis yielding 4 liters of peritoneal fluid. PLAN: The patient has required >/=2 paracenteses in a 30 day period. He is currently being followed by the [facility]. IR is available for consideration of TIPS if local care is desired. Read by Lavonia Drafts [facility] Electronically Signed   By: Ruthann Cancer M.D.   On: 11/18/2022 13:10   DG Chest Port 1 View  Result Date: 11/18/2022 CLINICAL DATA:  Weakness, history of fall 2 weeks ago. EXAM: PORTABLE CHEST 1 VIEW COMPARISON:  10/28/2022. FINDINGS: The heart size and mediastinal contours are stable. There is atherosclerotic calcification of the aorta. Lung volumes are low with mild atelectasis at the lung bases. No definite effusion or pneumothorax. Sternotomy wires are noted. No acute osseous abnormality. IMPRESSION: Low lung volumes with atelectasis at the lung bases. Electronically Signed   By: Regan Rakers.D.  On: 11/18/2022 03:45     Discharge Exam: Vitals:   11/29/22 2110 11/30/22 0346  BP: (!) 108/55 104/62  Pulse: 82 71  Resp: 18 16  Temp: 97.9 F (36.6 C) 98.2 F (36.8 C)  SpO2: 95% 96%   Vitals:   11/29/22 1659 11/29/22 2110 11/30/22 0346 11/30/22 1118  BP: 125/70 (!) 108/55 104/62   Pulse: 85 82 71   Resp: _0 Temp: 98.1 F (36.7 C) 97.9 F (36.6 C) 98.2 F (36.8 C)   TempSrc: Oral Oral Oral   SpO2: 95% 95% 96%   Weight:    110 kg  Height:        General: Pt is alert, awake, not in acute distress Cardiovascular: RRR, S1/S2 +, no rubs, no gallops Respiratory: CTA bilaterally, no wheezing, no rhonchi Abdominal: Soft, NT, ND, bowel sounds +, minimally distended Extremities: no edema, no cyanosis    The results of significant diagnostics from this hospitalization (including imaging, microbiology, ancillary and laboratory) are listed below for reference.     Microbiology: Recent Results (from the past 240 hour(s))  MRSA Next Gen by PCR, Nasal      Status: Abnormal   Collection Time: 11/25/22  4:00 PM   Specimen: Nasal Mucosa; Nasal Swab  Result Value Ref Range Status   MRSA by PCR Next Gen DETECTED (A) NOT DETECTED Final    Comment: RESULT CALLED TO, READ BACK BY AND VERIFIED WITH: ELLER,J ON 11/25/22 AT 2255 BY LOY,C (NOTE) The GeneXpert MRSA Assay (FDA approved for NASAL specimens only), is one component of a comprehensive MRSA colonization surveillance program. It is not intended to diagnose MRSA infection nor to guide or monitor treatment for MRSA infections. Test performance is not FDA approved in patients less than 19 years old. Performed at [facility], [address], [place], [place] [address]   Gram stain     Status: None   Collection Time: 11/26/22 11:51 AM   Specimen: Peritoneal Washings  Result Value Ref Range Status   Specimen Description PERITONEAL  Final   Special Requests NONE  Final   Gram Stain   Final    NO ORGANISMS SEEN WBC PRESENT, PREDOMINANTLY MONONUCLEAR CYTOSPIN SMEAR Performed at [facility], [address] [place], [place] [address]    Report Status 11/26/2022 FINAL  Final  Culture, body fluid w Gram Stain-bottle     Status: None (Preliminary result)   Collection Time: 11/26/22 11:51 AM   Specimen: Peritoneal Washings  Result Value Ref Range Status   Specimen Description PERITONEAL  Final   Special Requests BOTTLES DRAWN AEROBIC AND ANAEROBIC 10CC  Final   Culture   Final    NO GROWTH 3 DAYS Performed at [facility], [address] [place], [place] [address]    Report Status PENDING  Incomplete     Labs: BNP (last 3 results) Recent Labs    09/12/22 1812 09/20/22 1107 11/18/22 0313  BNP 136.0* 110.0* 502.7*   Basic Metabolic Panel: Recent Labs  Lab 11/26/22 0439 11/27/22 0409 11/28/22 0323 11/29/22 0437 11/30/22 0402  NA 131* 132* 131* 133* 133*  K 3.7 4.1 3.7 3.7 3.4*  CL 94* 93* 95* 94* 95*  CO2 _1 GLUCOSE 161* 155* 180* 175* 211*  BUN 34* 34* 30* 29*  33*  CREATININE 1.82* 1.75* 1.47* 1.52* 1.70*  CALCIUM 8.8* 8.6* 8.8* 9.1 9.2  MG 1.9 2.1 2.1 2.2 2.0   Liver Function Tests: Recent Labs  Lab 11/25/22 0312 11/26/22 0439 11/27/22 0409 11/28/22 0323 11/29/22 0437  AST _0 ALT _1 ALKPHOS 61 60 63 51 48  BILITOT 2.9* 2.7* 2.7* 2.6* 3.2*  PROT 7.3 6.9 7.1 7.1 7.6  ALBUMIN 3.6 3.4* 3.2* 4.0 4.8   No results for input(s): "LIPASE", "AMYLASE" in the last 168 hours. Recent Labs  Lab 11/25/22 0312 11/26/22 0439 11/27/22 0409  AMMONIA 64* 58* 32   CBC: Recent Labs  Lab 11/24/22 0746 11/25/22 0312 11/25/22 0839 11/26/22 0439 11/27/22 0409 11/28/22 0323 11/29/22 0437  WBC 4.1 3.3*  --  4.0 4.7 3.8* 4.0  NEUTROABS 2.5  --   --   --   --   --   --   HGB 9.9* 7.9* 7.6* 8.0* 9.5* 8.3* 8.9*  HCT 29.4* 23.7* 23.0* 24.3* 28.9* 24.4* 27.0*  MCV 95.5 94.8  --  95.3 97.6 93.8 95.4  PLT 142* 107*  --  112* 157 111* 112*   Cardiac Enzymes: No results for input(s): "CKTOTAL", "CKMB", "CKMBINDEX", "TROPONINI" in the last 168 hours. BNP: Invalid input(s): "POCBNP" CBG: Recent Labs  Lab 11/29/22 2116 11/29/22 2335 11/30/22 0350 11/30/22 0702 11/30/22 1104  GLUCAP 321* 250* 223* 189* 350*   D-Dimer No results for input(s): "DDIMER" in the last 72 hours. Hgb A1c No results for input(s): "HGBA1C" in the last 72 hours. Lipid Profile No results for input(s): "CHOL", "HDL", "LDLCALC", "TRIG", "CHOLHDL", "LDLDIRECT" in the last 72 hours. Thyroid function studies No results for input(s): "TSH", "T4TOTAL", "T3FREE", "THYROIDAB" in the last 72 hours.  Invalid input(s): "FREET3" Anemia work up No results for input(s): "VITAMINB12", "FOLATE", "FERRITIN", "TIBC", "IRON", "RETICCTPCT" in the last 72 hours. Urinalysis    Component Value Date/Time   COLORURINE YELLOW 11/18/2022 0313   APPEARANCEUR CLEAR 11/18/2022 0313   LABSPEC 1.010 11/18/2022 0313   PHURINE 6.0 11/18/2022 0313   GLUCOSEU NEGATIVE 11/18/2022  0313   HGBUR MODERATE (A) 11/18/2022 0313   BILIRUBINUR NEGATIVE 11/18/2022 0313   KETONESUR NEGATIVE 11/18/2022 0313   PROTEINUR NEGATIVE 11/18/2022 0313   UROBILINOGEN 1.0 08/28/2015 1634   NITRITE NEGATIVE 11/18/2022 0313   LEUKOCYTESUR NEGATIVE 11/18/2022 0313   Sepsis Labs Recent Labs  Lab 11/26/22 0439 11/27/22 0409 11/28/22 0323 11/29/22 0437  WBC 4.0 4.7 3.8* 4.0   Microbiology Recent Results (from the past 240 hour(s))  MRSA Next Gen by PCR, Nasal     Status: Abnormal   Collection Time: 11/25/22  4:00 PM   Specimen: Nasal Mucosa; Nasal Swab  Result Value Ref Range Status   MRSA by PCR Next Gen DETECTED (A) NOT DETECTED Final    Comment: RESULT CALLED TO, READ BACK BY AND VERIFIED WITH: ELLER,J ON 11/25/22 AT 2255 BY LOY,C (NOTE) The GeneXpert MRSA Assay (FDA approved for NASAL specimens only), is one component of a comprehensive MRSA colonization surveillance program. It is not intended to diagnose MRSA infection nor to guide or monitor treatment for MRSA infections. Test performance is not FDA approved in patients less than 24 years old. Performed at [facility], [address], [place], [place] [address]   Gram stain     Status: None   Collection Time: 11/26/22 11:51 AM   Specimen: Peritoneal Washings  Result Value Ref Range Status   Specimen Description PERITONEAL  Final   Special Requests NONE  Final   Gram Stain   Final    NO ORGANISMS SEEN WBC PRESENT, PREDOMINANTLY MONONUCLEAR CYTOSPIN SMEAR Performed  at [facility], [name] [place], [place] [address]    Report Status 11/26/2022 FINAL  Final  Culture, body fluid w Gram Stain-bottle     Status: None (Preliminary result)   Collection Time: 11/26/22 11:51 AM   Specimen: Peritoneal Washings  Result Value Ref Range Status   Specimen Description PERITONEAL  Final   Special Requests BOTTLES DRAWN AEROBIC AND ANAEROBIC 10CC  Final   Culture   Final    NO GROWTH 3 DAYS Performed at [facility], [address], [place], [place] [address]    Report Status PENDING  Incomplete     Time coordinating discharge: 35 minutes  SIGNED:   Rodena Goldmann, DO [facility] 11/30/2022, 12:07 PM  If 7PM-7AM, please contact night-coverage [email or web address]

## 2022-11-30 NOTE — Progress Notes (Signed)
Patient setup with home CPAP unit with 2L oxygen.  Tolerating well at this time

## 2022-11-30 NOTE — Progress Notes (Signed)
PROGRESS NOTE    [name]  FVC:[id number] DOB: [birth year] DOA: 11/24/2022 PCP: [name], MD   Brief Narrative:    [name] is a 60 y.o. male with medical history significant for [name] cirrhosis with ascites, chronic kidney disease stage IIIa, obstructive sleep apnea on CPAP, type 2 diabetes with nephropathy, anemia of chronic kidney disease, diastolic heart failure and class II obesity who was just recently discharged from the hospital on 12/12 with treatment of a ascites with paracentesis after he presented for generalized weakness and increased weight gain and shortness of breath.  He had 4 L of fluid removed on 12/7 and appears to require another paracentesis and is complaining again of significant weakness and inability to ambulate at home.   He was noted to have GI bleeding with worsening anemia on 12/14 and underwent EGD with esophageal varices noted and had some banding.  No stigmata of bleeding was noted, however he returned intubated and on ventilator.  He was subsequently extubated on 12/15 and did undergo 4 L paracentesis as well.  He is currently awaiting SNF placement.    Assessment & Plan:   Principal Problem:   Weakness Active Problems:   Essential hypertension   Hyperglycemia   S/P CABG x 4   OSA on CPAP   NASH cirrhosis of liver (HCC)   Anasarca   Class 2 obesity   Hyponatremia   CKD stage 3 due to type 2 diabetes mellitus (HCC)   Anemia   GI bleed   CAD (coronary artery disease)   HLD (hyperlipidemia)   Chronic idiopathic thrombocytopenia (HCC)   Decompensated hepatic cirrhosis (HCC)   Ascites  Assessment and Plan:   Acute blood loss anemia secondary to GI bleed-stable -Status post EGD with esophageal varices and banding performed -Now extubated and started on soft diet -Completed course of Rocephin -PPI twice daily -Monitor CBC   Generalized weakness in the setting of recurrent ascites due to NASH cirrhosis-decompensated  cirrhosis - plan to give albumin for now and await paracentesis once abdomen is more tense and plan to do 4 L paracentesis as needed -PT recommending SNF, awaiting placement -4 L paracentesis performed on 12/15 -Albumin 50 mg 3 times daily per GI -Resumed spironolactone and torsemide 12/18, but will hold for now given some hypokalemia and elevated creatinine levels   Mild hyponatremia-stable -Likely due to volume overload -Monitor closely  Mild hypokalemia -Replete and reevaluate in a.m.   Chronic diastolic CHF -No indication for exacerbation at this time, monitor   Stage II pressure injury to sacrum -Reposition and keep dry -Wound care   Dyslipidemia -continue statin and monitor LFTs   Type 2 diabetes with hyperglycemia -semglee 36 units daily -Novolog 14 units TID -Appreciate diabetes coordinator   AKI on CKD stage IIIa -Continue to monitor -Avoid nephrotoxic agents -Holding diuretics for now   GERD -PPI   Nash liver cirrhosis -LFTs currently stable -MELD score of 24 on 12/14   OSA on CPAP -PAP at bedtime now that patient is extubated   Status post CABG x 4 -Hold home medications for now   Physical deconditioning -PT recommending SNF   Class II obesity -BMI 35.68     DVT prophylaxis: SCDs Code Status: Full Family Communication: None at bedside Disposition Plan:  Status is: Inpatient Remains inpatient appropriate because: Need for mechanical ventilation, IV medications.     Consultants:  GI Pulmonology   Procedures:  EGD with variceal banding 12/14 and subsequent intubation Extubation 12/15 4  L paracentesis 12/15    Antimicrobials:  Anti-infectives (From admission, onward)    Start     Dose/Rate Route Frequency Ordered Stop   11/29/22 1215  cefTRIAXone (ROCEPHIN) 1 g in sodium chloride 0.9 % 100 mL IVPB        1 g 200 mL/hr over 30 Minutes Intravenous Every 24 hours 11/29/22 0941 11/30/22 0951   11/25/22 1215  cefTRIAXone (ROCEPHIN) 1 g  in sodium chloride 0.9 % 100 mL IVPB  Status:  Discontinued        1 g 200 mL/hr over 30 Minutes Intravenous Every 24 hours 11/25/22 1129 11/29/22 0941      Subjective: Patient seen and evaluated today with no new acute complaints or concerns. No acute concerns or events noted overnight.  Objective: Vitals:   11/29/22 1659 11/29/22 2110 11/30/22 0346 11/30/22 1118  BP: 125/70 (!) 108/55 104/62   Pulse: 85 82 71   Resp: 18 18 16    Temp: 98.1 F (36.7 C) 97.9 F (36.6 C) 98.2 F (36.8 C)   TempSrc: Oral Oral Oral   SpO2: 95% 95% 96%   Weight:    110 kg  Height:        Intake/Output Summary (Last 24 hours) at 11/30/2022 1144 Last data filed at 11/30/2022 0952 Gross per 24 hour  Intake 243 ml  Output 1125 ml  Net -882 ml   Filed Weights   11/24/22 2329 11/26/22 0604 11/30/22 1118  Weight: 114.4 kg 111.5 kg 110 kg    Examination:  General exam: Appears calm and comfortable  Respiratory system: Clear to auscultation. Respiratory effort normal. Cardiovascular system: S1 & S2 heard, RRR.  Gastrointestinal system: Abdomen is soft, mildly distended Central nervous system: Alert and awake Extremities: No edema Skin: No significant lesions noted Psychiatry: Flat affect.    Data Reviewed: I have personally reviewed following labs and imaging studies  CBC: Recent Labs  Lab 11/24/22 0746 11/25/22 0312 11/25/22 0839 11/26/22 0439 11/27/22 0409 11/28/22 0323 11/29/22 0437  WBC 4.1 3.3*  --  4.0 4.7 3.8* 4.0  NEUTROABS 2.5  --   --   --   --   --   --   HGB 9.9* 7.9* 7.6* 8.0* 9.5* 8.3* 8.9*  HCT 29.4* 23.7* 23.0* 24.3* 28.9* 24.4* 27.0*  MCV 95.5 94.8  --  95.3 97.6 93.8 95.4  PLT 142* 107*  --  112* 157 111* 681*   Basic Metabolic Panel: Recent Labs  Lab 11/26/22 0439 11/27/22 0409 11/28/22 0323 11/29/22 0437 11/30/22 0402  NA 131* 132* 131* 133* 133*  K 3.7 4.1 3.7 3.7 3.4*  CL 94* 93* 95* 94* 95*  CO2 25 27 27 28 26   GLUCOSE 161* 155* 180* 175* 211*   BUN 34* 34* 30* 29* 33*  CREATININE 1.82* 1.75* 1.47* 1.52* 1.70*  CALCIUM 8.8* 8.6* 8.8* 9.1 9.2  MG 1.9 2.1 2.1 2.2 2.0   GFR: Estimated Creatinine Clearance: 55.7 mL/min (A) (by C-G formula based on SCr of 1.7 mg/dL (H)). Liver Function Tests: Recent Labs  Lab 11/25/22 0312 11/26/22 0439 11/27/22 0409 11/28/22 0323 11/29/22 0437  AST 22 22 20 19 19   ALT 9 8 9 8 7   ALKPHOS 61 60 63 51 48  BILITOT 2.9* 2.7* 2.7* 2.6* 3.2*  PROT 7.3 6.9 7.1 7.1 7.6  ALBUMIN 3.6 3.4* 3.2* 4.0 4.8   No results for input(s): "LIPASE", "AMYLASE" in the last 168 hours. Recent Labs  Lab 11/25/22 [phone] 11/26/22 0439 11/27/22 0409  AMMONIA 64* 58* 32   Coagulation Profile: Recent Labs  Lab 11/25/22 0312 11/26/22 0439 11/27/22 0409 11/29/22 0437  INR 1.4* 1.6* 1.4* 1.7*   Cardiac Enzymes: No results for input(s): "CKTOTAL", "CKMB", "CKMBINDEX", "TROPONINI" in the last 168 hours. BNP (last 3 results) No results for input(s): "PROBNP" in the last 8760 hours. HbA1C: No results for input(s): "HGBA1C" in the last 72 hours. CBG: Recent Labs  Lab 11/29/22 2116 11/29/22 2335 11/30/22 0350 11/30/22 0702 11/30/22 1104  GLUCAP 321* 250* 223* 189* 350*   Lipid Profile: No results for input(s): "CHOL", "HDL", "LDLCALC", "TRIG", "CHOLHDL", "LDLDIRECT" in the last 72 hours. Thyroid Function Tests: No results for input(s): "TSH", "T4TOTAL", "FREET4", "T3FREE", "THYROIDAB" in the last 72 hours. Anemia Panel: No results for input(s): "VITAMINB12", "FOLATE", "FERRITIN", "TIBC", "IRON", "RETICCTPCT" in the last 72 hours. Sepsis Labs: No results for input(s): "PROCALCITON", "LATICACIDVEN" in the last 168 hours.  Recent Results (from the past 240 hour(s))  MRSA Next Gen by PCR, Nasal     Status: Abnormal   Collection Time: 11/25/22  4:00 PM   Specimen: Nasal Mucosa; Nasal Swab  Result Value Ref Range Status   MRSA by PCR Next Gen DETECTED (A) NOT DETECTED Final    Comment: RESULT CALLED TO,  READ BACK BY AND VERIFIED WITH: ELLER,J ON 11/25/22 AT 2255 BY LOY,C (NOTE) The GeneXpert MRSA Assay (FDA approved for NASAL specimens only), is one component of a comprehensive MRSA colonization surveillance program. It is not intended to diagnose MRSA infection nor to guide or monitor treatment for MRSA infections. Test performance is not FDA approved in patients less than 11 years old. Performed at [facility], [address], [place], [place] [address]   Gram stain     Status: None   Collection Time: 11/26/22 11:51 AM   Specimen: Peritoneal Washings  Result Value Ref Range Status   Specimen Description PERITONEAL  Final   Special Requests NONE  Final   Gram Stain   Final    NO ORGANISMS SEEN WBC PRESENT, PREDOMINANTLY MONONUCLEAR CYTOSPIN SMEAR Performed at [facility], [address], [place], [place] [address]    Report Status 11/26/2022 FINAL  Final  Culture, body fluid w Gram Stain-bottle     Status: None (Preliminary result)   Collection Time: 11/26/22 11:51 AM   Specimen: Peritoneal Washings  Result Value Ref Range Status   Specimen Description PERITONEAL  Final   Special Requests BOTTLES DRAWN AEROBIC AND ANAEROBIC 10CC  Final   Culture   Final    NO GROWTH 3 DAYS Performed at [facility], [address], [place], [place] [address]    Report Status PENDING  Incomplete         Radiology Studies: No results found.      Scheduled Meds:  insulin aspart  0-5 Units Subcutaneous QHS   insulin aspart  0-9 Units Subcutaneous TID WC   insulin aspart  14 Units Subcutaneous TID WC   insulin glargine-yfgn  36 Units Subcutaneous Daily   lactulose  20 g Oral Daily   midodrine  10 mg Oral TID WC   mouth rinse  15 mL Mouth Rinse 4 times per day   pantoprazole  40 mg Oral BID AC   sodium chloride flush  3 mL Intravenous Q12H   Continuous Infusions:  sodium chloride 10 mL/hr at 11/26/22 1516   albumin human 50 g (11/30/22 0501)     LOS: 5 days    Time spent: 35  minutes  Christopher Farha Darleen Crocker, DO [facility]  If 7PM-7AM, please contact night-coverage [email or web address] 11/30/2022, 11:44 AM

## 2022-11-30 NOTE — Progress Notes (Signed)
Spoke to [place]) and gave report. Nurse had no further questions. EMS at bedside to transport pt.

## 2022-12-01 ENCOUNTER — Encounter (HOSPITAL_COMMUNITY): Payer: Self-pay | Admitting: Internal Medicine

## 2022-12-01 DIAGNOSIS — R188 Other ascites: Secondary | ICD-10-CM | POA: Diagnosis not present

## 2022-12-01 DIAGNOSIS — E785 Hyperlipidemia, unspecified: Secondary | ICD-10-CM | POA: Diagnosis not present

## 2022-12-01 DIAGNOSIS — M6281 Muscle weakness (generalized): Secondary | ICD-10-CM | POA: Diagnosis not present

## 2022-12-01 DIAGNOSIS — K769 Liver disease, unspecified: Secondary | ICD-10-CM | POA: Diagnosis not present

## 2022-12-01 LAB — CBC
HCT: 23.7 % — ABNORMAL LOW (ref 39.0–52.0)
Hemoglobin: 7.9 g/dL — ABNORMAL LOW (ref 13.0–17.0)
MCH: 31.6 pg (ref 26.0–34.0)
MCHC: 33.3 g/dL (ref 30.0–36.0)
MCV: 94.8 fL (ref 80.0–100.0)
Platelets: 107 10*3/uL — ABNORMAL LOW (ref 150–400)
RBC: 2.5 MIL/uL — ABNORMAL LOW (ref 4.22–5.81)
RDW: 16.6 % — ABNORMAL HIGH (ref 11.5–15.5)
WBC: 3.3 10*3/uL — ABNORMAL LOW (ref 4.0–10.5)
nRBC: 0 % (ref 0.0–0.2)

## 2022-12-01 LAB — CULTURE, BODY FLUID W GRAM STAIN -BOTTLE: Culture: NO GROWTH

## 2022-12-02 DIAGNOSIS — R188 Other ascites: Secondary | ICD-10-CM | POA: Diagnosis not present

## 2022-12-02 DIAGNOSIS — I10 Essential (primary) hypertension: Secondary | ICD-10-CM | POA: Diagnosis not present

## 2022-12-02 DIAGNOSIS — R5381 Other malaise: Secondary | ICD-10-CM | POA: Diagnosis not present

## 2022-12-02 DIAGNOSIS — K7581 Nonalcoholic steatohepatitis (NASH): Secondary | ICD-10-CM | POA: Diagnosis not present

## 2022-12-03 DIAGNOSIS — L89321 Pressure ulcer of left buttock, stage 1: Secondary | ICD-10-CM | POA: Diagnosis not present

## 2022-12-03 DIAGNOSIS — L89153 Pressure ulcer of sacral region, stage 3: Secondary | ICD-10-CM | POA: Diagnosis not present

## 2022-12-03 DIAGNOSIS — K769 Liver disease, unspecified: Secondary | ICD-10-CM | POA: Diagnosis not present

## 2022-12-03 DIAGNOSIS — M6281 Muscle weakness (generalized): Secondary | ICD-10-CM | POA: Diagnosis not present

## 2022-12-03 DIAGNOSIS — R188 Other ascites: Secondary | ICD-10-CM | POA: Diagnosis not present

## 2022-12-03 DIAGNOSIS — E785 Hyperlipidemia, unspecified: Secondary | ICD-10-CM | POA: Diagnosis not present

## 2022-12-07 ENCOUNTER — Inpatient Hospital Stay (HOSPITAL_COMMUNITY)
Admission: EM | Admit: 2022-12-07 | Discharge: 2023-01-13 | DRG: 177 | Disposition: E | Payer: Federal, State, Local not specified - PPO | Source: Skilled Nursing Facility | Attending: Internal Medicine | Admitting: Internal Medicine

## 2022-12-07 ENCOUNTER — Encounter (HOSPITAL_COMMUNITY): Payer: Self-pay | Admitting: Emergency Medicine

## 2022-12-07 ENCOUNTER — Emergency Department (HOSPITAL_COMMUNITY): Payer: Federal, State, Local not specified - PPO

## 2022-12-07 ENCOUNTER — Other Ambulatory Visit: Payer: Self-pay

## 2022-12-07 DIAGNOSIS — K7581 Nonalcoholic steatohepatitis (NASH): Secondary | ICD-10-CM | POA: Diagnosis not present

## 2022-12-07 DIAGNOSIS — U071 COVID-19: Principal | ICD-10-CM | POA: Diagnosis present

## 2022-12-07 DIAGNOSIS — I5032 Chronic diastolic (congestive) heart failure: Secondary | ICD-10-CM | POA: Diagnosis present

## 2022-12-07 DIAGNOSIS — Z8249 Family history of ischemic heart disease and other diseases of the circulatory system: Secondary | ICD-10-CM | POA: Diagnosis not present

## 2022-12-07 DIAGNOSIS — Z8719 Personal history of other diseases of the digestive system: Secondary | ICD-10-CM

## 2022-12-07 DIAGNOSIS — Z515 Encounter for palliative care: Secondary | ICD-10-CM | POA: Diagnosis not present

## 2022-12-07 DIAGNOSIS — K7460 Unspecified cirrhosis of liver: Secondary | ICD-10-CM | POA: Diagnosis present

## 2022-12-07 DIAGNOSIS — N179 Acute kidney failure, unspecified: Secondary | ICD-10-CM | POA: Diagnosis present

## 2022-12-07 DIAGNOSIS — R739 Hyperglycemia, unspecified: Secondary | ICD-10-CM | POA: Diagnosis not present

## 2022-12-07 DIAGNOSIS — D890 Polyclonal hypergammaglobulinemia: Secondary | ICD-10-CM | POA: Diagnosis present

## 2022-12-07 DIAGNOSIS — Z7982 Long term (current) use of aspirin: Secondary | ICD-10-CM

## 2022-12-07 DIAGNOSIS — R188 Other ascites: Secondary | ICD-10-CM | POA: Diagnosis present

## 2022-12-07 DIAGNOSIS — E7220 Disorder of urea cycle metabolism, unspecified: Secondary | ICD-10-CM | POA: Insufficient documentation

## 2022-12-07 DIAGNOSIS — Z951 Presence of aortocoronary bypass graft: Secondary | ICD-10-CM

## 2022-12-07 DIAGNOSIS — L8990 Pressure ulcer of unspecified site, unspecified stage: Secondary | ICD-10-CM | POA: Diagnosis present

## 2022-12-07 DIAGNOSIS — R404 Transient alteration of awareness: Secondary | ICD-10-CM | POA: Diagnosis not present

## 2022-12-07 DIAGNOSIS — L89159 Pressure ulcer of sacral region, unspecified stage: Secondary | ICD-10-CM

## 2022-12-07 DIAGNOSIS — D631 Anemia in chronic kidney disease: Secondary | ICD-10-CM | POA: Diagnosis present

## 2022-12-07 DIAGNOSIS — E870 Hyperosmolality and hypernatremia: Secondary | ICD-10-CM | POA: Diagnosis present

## 2022-12-07 DIAGNOSIS — R52 Pain, unspecified: Secondary | ICD-10-CM | POA: Diagnosis not present

## 2022-12-07 DIAGNOSIS — G4733 Obstructive sleep apnea (adult) (pediatric): Secondary | ICD-10-CM | POA: Diagnosis present

## 2022-12-07 DIAGNOSIS — Z833 Family history of diabetes mellitus: Secondary | ICD-10-CM

## 2022-12-07 DIAGNOSIS — I2510 Atherosclerotic heart disease of native coronary artery without angina pectoris: Secondary | ICD-10-CM | POA: Diagnosis present

## 2022-12-07 DIAGNOSIS — N189 Chronic kidney disease, unspecified: Secondary | ICD-10-CM | POA: Diagnosis not present

## 2022-12-07 DIAGNOSIS — Z7189 Other specified counseling: Secondary | ICD-10-CM | POA: Diagnosis not present

## 2022-12-07 DIAGNOSIS — E785 Hyperlipidemia, unspecified: Secondary | ICD-10-CM | POA: Diagnosis present

## 2022-12-07 DIAGNOSIS — I10 Essential (primary) hypertension: Secondary | ICD-10-CM | POA: Diagnosis not present

## 2022-12-07 DIAGNOSIS — E119 Type 2 diabetes mellitus without complications: Secondary | ICD-10-CM

## 2022-12-07 DIAGNOSIS — I130 Hypertensive heart and chronic kidney disease with heart failure and stage 1 through stage 4 chronic kidney disease, or unspecified chronic kidney disease: Secondary | ICD-10-CM | POA: Diagnosis present

## 2022-12-07 DIAGNOSIS — E1122 Type 2 diabetes mellitus with diabetic chronic kidney disease: Secondary | ICD-10-CM | POA: Diagnosis present

## 2022-12-07 DIAGNOSIS — Z79899 Other long term (current) drug therapy: Secondary | ICD-10-CM

## 2022-12-07 DIAGNOSIS — M1990 Unspecified osteoarthritis, unspecified site: Secondary | ICD-10-CM | POA: Diagnosis present

## 2022-12-07 DIAGNOSIS — L89152 Pressure ulcer of sacral region, stage 2: Secondary | ICD-10-CM | POA: Diagnosis present

## 2022-12-07 DIAGNOSIS — D509 Iron deficiency anemia, unspecified: Secondary | ICD-10-CM | POA: Diagnosis present

## 2022-12-07 DIAGNOSIS — Z66 Do not resuscitate: Secondary | ICD-10-CM | POA: Diagnosis not present

## 2022-12-07 DIAGNOSIS — R55 Syncope and collapse: Secondary | ICD-10-CM | POA: Diagnosis not present

## 2022-12-07 DIAGNOSIS — E860 Dehydration: Secondary | ICD-10-CM | POA: Diagnosis present

## 2022-12-07 DIAGNOSIS — E782 Mixed hyperlipidemia: Secondary | ICD-10-CM | POA: Diagnosis not present

## 2022-12-07 DIAGNOSIS — Z794 Long term (current) use of insulin: Secondary | ICD-10-CM

## 2022-12-07 DIAGNOSIS — Z888 Allergy status to other drugs, medicaments and biological substances status: Secondary | ICD-10-CM

## 2022-12-07 DIAGNOSIS — R4182 Altered mental status, unspecified: Secondary | ICD-10-CM | POA: Diagnosis not present

## 2022-12-07 DIAGNOSIS — D649 Anemia, unspecified: Secondary | ICD-10-CM | POA: Diagnosis not present

## 2022-12-07 DIAGNOSIS — K7469 Other cirrhosis of liver: Secondary | ICD-10-CM | POA: Diagnosis not present

## 2022-12-07 DIAGNOSIS — E1165 Type 2 diabetes mellitus with hyperglycemia: Secondary | ICD-10-CM | POA: Diagnosis not present

## 2022-12-07 DIAGNOSIS — E876 Hypokalemia: Secondary | ICD-10-CM | POA: Diagnosis present

## 2022-12-07 DIAGNOSIS — K7682 Hepatic encephalopathy: Secondary | ICD-10-CM | POA: Diagnosis present

## 2022-12-07 DIAGNOSIS — G9341 Metabolic encephalopathy: Secondary | ICD-10-CM | POA: Diagnosis present

## 2022-12-07 DIAGNOSIS — R54 Age-related physical debility: Secondary | ICD-10-CM | POA: Diagnosis present

## 2022-12-07 DIAGNOSIS — I129 Hypertensive chronic kidney disease with stage 1 through stage 4 chronic kidney disease, or unspecified chronic kidney disease: Secondary | ICD-10-CM | POA: Diagnosis not present

## 2022-12-07 DIAGNOSIS — N1832 Chronic kidney disease, stage 3b: Secondary | ICD-10-CM | POA: Diagnosis present

## 2022-12-07 LAB — COMPREHENSIVE METABOLIC PANEL
ALT: 10 U/L (ref 0–44)
AST: 26 U/L (ref 15–41)
Albumin: 4.4 g/dL (ref 3.5–5.0)
Alkaline Phosphatase: 73 U/L (ref 38–126)
Anion gap: 16 — ABNORMAL HIGH (ref 5–15)
BUN: 84 mg/dL — ABNORMAL HIGH (ref 8–23)
CO2: 29 mmol/L (ref 22–32)
Calcium: 10.3 mg/dL (ref 8.9–10.3)
Chloride: 90 mmol/L — ABNORMAL LOW (ref 98–111)
Creatinine, Ser: 1.85 mg/dL — ABNORMAL HIGH (ref 0.61–1.24)
GFR, Estimated: 40 mL/min — ABNORMAL LOW (ref >60)
Glucose, Bld: 416 mg/dL — ABNORMAL HIGH (ref 70–99)
Potassium: 3.7 mmol/L (ref 3.5–5.1)
Sodium: 135 mmol/L (ref 135–145)
Total Bilirubin: 4.1 mg/dL — ABNORMAL HIGH (ref 0.3–1.2)
Total Protein: 9.6 g/dL — ABNORMAL HIGH (ref 6.5–8.1)

## 2022-12-07 LAB — CBC WITH DIFFERENTIAL/PLATELET
Abs Immature Granulocytes: 0.05 10*3/uL (ref 0.00–0.07)
Basophils Absolute: 0 10*3/uL (ref 0.0–0.1)
Basophils Relative: 0 %
Eosinophils Absolute: 0 10*3/uL (ref 0.0–0.5)
Eosinophils Relative: 1 %
HCT: 24.3 % — ABNORMAL LOW (ref 39.0–52.0)
Hemoglobin: 8 g/dL — ABNORMAL LOW (ref 13.0–17.0)
Immature Granulocytes: 1 %
Lymphocytes Relative: 6 %
Lymphs Abs: 0.4 10*3/uL — ABNORMAL LOW (ref 0.7–4.0)
MCH: 31.4 pg (ref 26.0–34.0)
MCHC: 32.9 g/dL (ref 30.0–36.0)
MCV: 95.3 fL (ref 80.0–100.0)
Monocytes Absolute: 0.7 10*3/uL (ref 0.1–1.0)
Monocytes Relative: 10 %
Neutro Abs: 5.4 10*3/uL (ref 1.7–7.7)
Neutrophils Relative %: 82 %
Platelets: 173 10*3/uL (ref 150–400)
RBC: 2.55 MIL/uL — ABNORMAL LOW (ref 4.22–5.81)
RDW: 16.1 % — ABNORMAL HIGH (ref 11.5–15.5)
WBC: 6.5 10*3/uL (ref 4.0–10.5)
nRBC: 0 % (ref 0.0–0.2)

## 2022-12-07 LAB — URINALYSIS, ROUTINE W REFLEX MICROSCOPIC
Bilirubin Urine: NEGATIVE
Glucose, UA: 50 mg/dL — AB
Ketones, ur: NEGATIVE mg/dL
Leukocytes,Ua: NEGATIVE
Nitrite: NEGATIVE
Protein, ur: 30 mg/dL — AB
Specific Gravity, Urine: 1.011 (ref 1.005–1.030)
pH: 5 (ref 5.0–8.0)

## 2022-12-07 LAB — RESP PANEL BY RT-PCR (RSV, FLU A&B, COVID)  RVPGX2
Influenza A by PCR: NEGATIVE
Influenza B by PCR: NEGATIVE
Resp Syncytial Virus by PCR: NEGATIVE
SARS Coronavirus 2 by RT PCR: POSITIVE — AB

## 2022-12-07 LAB — BLOOD GAS, VENOUS
Acid-Base Excess: 9.6 mmol/L — ABNORMAL HIGH (ref 0.0–2.0)
Bicarbonate: 33.5 mmol/L — ABNORMAL HIGH (ref 20.0–28.0)
Drawn by: 64452
O2 Saturation: 27.9 %
Patient temperature: 37
pCO2, Ven: 40 mmHg — ABNORMAL LOW (ref 44–60)
pH, Ven: 7.53 — ABNORMAL HIGH (ref 7.25–7.43)
pO2, Ven: <31 mmHg — CL (ref 32–45)

## 2022-12-07 LAB — CBG MONITORING, ED
Glucose-Capillary: 403 mg/dL — ABNORMAL HIGH (ref 70–99)
Glucose-Capillary: 384 mg/dL — ABNORMAL HIGH (ref 70–99)

## 2022-12-07 LAB — AMMONIA: Ammonia: 63 umol/L — ABNORMAL HIGH (ref 9–35)

## 2022-12-07 LAB — LACTIC ACID, PLASMA
Lactic Acid, Venous: 3.8 mmol/L (ref 0.5–1.9)
Lactic Acid, Venous: 3 mmol/L (ref 0.5–1.9)

## 2022-12-07 LAB — BETA-HYDROXYBUTYRIC ACID: Beta-Hydroxybutyric Acid: 0.18 mmol/L (ref 0.05–0.27)

## 2022-12-07 LAB — CK: Total CK: 16 U/L — ABNORMAL LOW (ref 49–397)

## 2022-12-07 MED ORDER — INSULIN ASPART 100 UNIT/ML IJ SOLN
8.0000 [IU] | Freq: Once | INTRAMUSCULAR | Status: AC
  Administered 2022-12-07: 8 [IU] via SUBCUTANEOUS
  Filled 2022-12-07: qty 1

## 2022-12-07 MED ORDER — SODIUM CHLORIDE 0.9 % IV BOLUS
1000.0000 mL | Freq: Once | INTRAVENOUS | Status: AC
  Administered 2022-12-07: 1000 mL via INTRAVENOUS

## 2022-12-07 MED ORDER — MORPHINE SULFATE (PF) 4 MG/ML IV SOLN
4.0000 mg | Freq: Once | INTRAVENOUS | Status: AC
  Administered 2022-12-07: 4 mg via INTRAVENOUS
  Filled 2022-12-07: qty 1

## 2022-12-07 NOTE — ED Provider Notes (Signed)
[name]   CSN: [id number] Arrival date & time: 11/26/2022  1800     History  Chief Complaint  Patient presents with   Altered Mental Status    [name] is a 61 y.o. male.  Pt is a 61 yo male with a pmhx significant for HTN, DM2, HLD, arthritis, CAD, CHF, and NASH cirrhosis.  Pt was admitted from 12/13-19 for GI bleed with esophageal varices.  Pt also had a paracentesis twice while in the hospital.  Pt was sent to the SNF at d/c.  It is unclear how long he's been unwell, but the SNF called EMS today due to AMS and elevated BS.  Pt is unable to contribute to hx.   Pt's wife said he's not been talking to her for 3 days.  He's just been groaning.  He hurts wherever she touches.  He's not eaten since yesterday.  He's been refusing meds.        Home Medications Prior to Admission medications   Medication Sig Start Date End Date Taking? Authorizing Provider  acetaminophen (TYLENOL) 325 MG tablet Take 650 mg by mouth every 6 (six) hours as needed for mild pain. 12/19/20   [provider]  albuterol (VENTOLIN HFA) 108 (90 Base) MCG/ACT inhaler Inhale 2 puffs into the lungs every 6 (six) hours as needed for wheezing or shortness of breath. 12/22/20   Gerlene Fee, NP  ALPRAZolam Duanne Moron) 0.25 MG tablet Take 1 tablet (0.25 mg total) by mouth daily. 11/30/22   Manuella Ghazi, Pratik D, DO  Armodafinil 50 MG tablet Take 1 tablet (50 mg total) by mouth as needed. 06/03/22   Dohmeier, Asencion Partridge, MD  aspirin EC 81 MG tablet Take 1 tablet (81 mg total) by mouth every evening. 10/23/22   Johnson, Clanford L, MD  Cholecalciferol (DIALYVITE VITAMIN D 5000) 125 MCG (5000 UT) capsule Take 5,000 Units by mouth in the morning.     [provider]  Continuous Blood Gluc Receiver (DEXCOM G5 RECEIVER KIT) DEVI Use to check/monitor sugar. 11/23/22   Barton Dubois, MD  Continuous Blood Gluc Sensor (FREESTYLE LIBRE 14 DAY SENSOR) MISC SMARTSIG:1 Each  Topical Every 2 Weeks 10/03/21   [provider]  insulin aspart (NOVOLOG) 100 UNIT/ML injection Inject 5 Units into the skin 3 (three) times daily before meals. Special Instructions: If accu-check is greater than 150. Hold for accu-check 150 or below. With Meals 12/22/20   Gerlene Fee, NP  insulin degludec (TRESIBA) 100 UNIT/ML FlexTouch Pen Inject 52 Units into the skin daily. 11/07/22 02/05/23  [provider]  lactulose (CHRONULAC) 10 GM/15ML solution Take 30 mLs (20 g total) by mouth daily. 12/22/20   Gerlene Fee, NP  loratadine (CLARITIN) 10 MG tablet Take 10 mg by mouth in the morning.     [provider]  metolazone (ZAROXOLYN) 2.5 MG tablet Take 1 tablet (2.5 mg total) by mouth once a week. Every Wedenesday 11/23/22   Barton Dubois, MD  midodrine (PROAMATINE) 10 MG tablet Take 1 tablet (10 mg total) by mouth 3 (three) times daily with meals. 08/25/21   Roxan Hockey, MD  nadolol (CORGARD) 20 MG tablet Take 1 tablet (20 mg total) by mouth daily. 06/07/22   Imogene Burn, PA-C  nitroGLYCERIN (NITROSTAT) 0.4 MG SL tablet Place 1 tablet (0.4 mg total) under the tongue every 5 (five) minutes as needed for chest pain. Do not exceed 3 doses in 15 mins 11/04/21 09/08/22  Hilty, Chrissie Noa  C, MD  oxyCODONE (OXY IR/ROXICODONE) 5 MG immediate release tablet Take 1 tablet (5 mg total) by mouth every 4 (four) hours as needed for moderate pain. 11/30/22   Manuella Ghazi, Pratik D, DO  pantoprazole (PROTONIX) 40 MG tablet Take 1 tablet (40 mg total) by mouth daily. 08/25/21 09/08/22  Roxan Hockey, MD  potassium chloride SA (KLOR-CON M) 20 MEQ tablet TAKE 1 TABLET(20 MEQ) BY MOUTH DAILY 09/27/22   Satira Sark, MD  pravastatin (PRAVACHOL) 20 MG tablet TAKE 1 TABLET(20 MG) BY MOUTH AT BEDTIME 09/08/22   Strader, [place] M, PA-C  spironolactone (ALDACTONE) 100 MG tablet Take 1 tablet (100 mg total) by mouth daily. 10/23/22   Johnson, Clanford L, MD  torsemide (DEMADEX) 20 MG  tablet Take 3 tablets (60 mg total) by mouth 2 (two) times daily. 11/08/22 11/03/23  Satira Sark, MD  venlafaxine XR (EFFEXOR-XR) 75 MG 24 hr capsule Take 1 capsule (75 mg total) by mouth daily with breakfast. 08/25/21   Roxan Hockey, MD      Allergies    Fluticasone    Review of Systems   Review of Systems  Unable to perform ROS: Mental status change  All other systems reviewed and are negative.   Physical Exam Updated Vital Signs BP (!) 112/54   Pulse 80   Temp 98.5 F (36.9 C) (Axillary)   Resp (!) 22   SpO2 95%  Physical Exam Vitals and nursing [name] reviewed.  Constitutional:      General: He is in acute distress.     Appearance: He is obese. He is ill-appearing.  HENT:     Head: Normocephalic and atraumatic.     Right Ear: External ear normal.     Left Ear: External ear normal.     Nose: Nose normal.     Mouth/Throat:     Mouth: Mucous membranes are dry.  Eyes:     Pupils: Pupils are equal, round, and reactive to light.  Cardiovascular:     Rate and Rhythm: Normal rate and regular rhythm.     Pulses: Normal pulses.     Heart sounds: Normal heart sounds.  Pulmonary:     Effort: Pulmonary effort is normal.     Breath sounds: Normal breath sounds.  Abdominal:     General: Abdomen is flat. Bowel sounds are normal.  Musculoskeletal:        General: Normal range of motion.     Cervical back: Normal range of motion and neck supple.  Skin:    General: Skin is warm.     Capillary Refill: Capillary refill takes 2 to 3 seconds.     Comments: Sacral decub   Neurological:     Mental Status: He is disoriented.  Psychiatric:        Behavior: Behavior is agitated.     ED Results / Procedures / Treatments   Labs (all labs ordered are listed, but only abnormal results are displayed) Labs Reviewed  RESP PANEL BY RT-PCR (RSV, FLU A&B, COVID)  RVPGX2 - Abnormal; Notable for the following components:      Result Value   SARS Coronavirus 2 by RT PCR POSITIVE  (*)    All other components within normal limits  CBC WITH DIFFERENTIAL/PLATELET - Abnormal; Notable for the following components:   RBC 2.55 (*)    Hemoglobin 8.0 (*)    HCT 24.3 (*)    RDW 16.1 (*)    Lymphs Abs 0.4 (*)    All other  components within normal limits  COMPREHENSIVE METABOLIC PANEL - Abnormal; Notable for the following components:   Chloride 90 (*)    Glucose, Bld 416 (*)    BUN 84 (*)    Creatinine, Ser 1.85 (*)    Total Protein 9.6 (*)    Total Bilirubin 4.1 (*)    GFR, Estimated 40 (*)    Anion gap 16 (*)    All other components within normal limits  URINALYSIS, ROUTINE W REFLEX MICROSCOPIC - Abnormal; Notable for the following components:   Glucose, UA 50 (*)    Hgb urine dipstick MODERATE (*)    Protein, ur 30 (*)    Bacteria, UA RARE (*)    All other components within normal limits  AMMONIA - Abnormal; Notable for the following components:   Ammonia 63 (*)    All other components within normal limits  BLOOD GAS, VENOUS - Abnormal; Notable for the following components:   pH, Ven 7.53 (*)    pCO2, Ven 40 (*)    pO2, Ven <31 (*)    Bicarbonate 33.5 (*)    Acid-Base Excess 9.6 (*)    All other components within normal limits  LACTIC ACID, PLASMA - Abnormal; Notable for the following components:   Lactic Acid, Venous 3.8 (*)    All other components within normal limits  LACTIC ACID, PLASMA - Abnormal; Notable for the following components:   Lactic Acid, Venous 3.0 (*)    All other components within normal limits  CK - Abnormal; Notable for the following components:   Total CK 16 (*)    All other components within normal limits  CBG MONITORING, ED - Abnormal; Notable for the following components:   Glucose-Capillary 403 (*)    All other components within normal limits  CBG MONITORING, ED - Abnormal; Notable for the following components:   Glucose-Capillary 384 (*)    All other components within normal limits  BETA-HYDROXYBUTYRIC ACID     EKG None  Radiology CT Head Wo Contrast  Result Date: 11/14/2022 CLINICAL DATA:  Mental status change, unknown cause EXAM: CT HEAD WITHOUT CONTRAST TECHNIQUE: Contiguous axial images were obtained from the base of the skull through the vertex without intravenous contrast. RADIATION DOSE REDUCTION: This exam was performed according to the departmental dose-optimization program which includes automated exposure control, adjustment of the mA and/or kV according to patient size and/or use of iterative reconstruction technique. COMPARISON:  10/28/2022 FINDINGS: Technical [name]: Examination is degraded by patient motion artifact, particularly at the skull base and posterior fossa. Examination was repeated once with slight improvement in degree of artifact. Brain: No evidence of acute infarction, hemorrhage, hydrocephalus, extra-axial collection or mass lesion/mass effect. Scattered low-density changes within the periventricular and subcortical white matter compatible with chronic microvascular ischemic change. Mild diffuse cerebral volume loss. Vascular: Atherosclerotic calcifications involving the large vessels of the skull base. No unexpected hyperdense vessel. Skull: No evidence of acute calvarial fracture. Sinuses/Orbits: Right maxillary sinus mucosal thickening. Other: None. IMPRESSION: 1. No acute intracranial findings. 2. Chronic microvascular ischemic change and cerebral volume loss. 3. Right maxillary sinus disease. Electronically Signed   By: Davina Poke D.O.   On: 12/09/2022 18:58   DG Chest 1 View  Result Date: 11/24/2022 CLINICAL DATA:  Altered mental status. EXAM: CHEST  1 VIEW COMPARISON:  November 26, 2022 FINDINGS: The heart size and mediastinal contours are within normal limits. Both lungs are clear. The visualized skeletal structures are unremarkable. IMPRESSION: No active disease. Electronically Signed  By: Dorise Bullion III M.D.   On: 12/11/2022 18:55     Procedures Procedures    Medications Ordered in ED Medications  sodium chloride 0.9 % bolus 1,000 mL (0 mLs Intravenous Stopped 11/21/2022 2126)  sodium chloride 0.9 % bolus 1,000 mL (1,000 mLs Intravenous New Bag/Given 12/08/2022 2137)  insulin aspart (novoLOG) injection 8 Units (8 Units Subcutaneous Given 11/19/2022 2215)  morphine (PF) 4 MG/ML injection 4 mg (4 mg Intravenous Given 11/30/2022 2217)    ED Course/ Medical Decision Making/ A&P                           Medical Decision Making Amount and/or Complexity of Data Reviewed Labs: ordered. Radiology: ordered.  Risk Prescription drug management. Decision regarding hospitalization.   This patient presents to the ED for concern of ams, this involves an extensive number of treatment options, and is a complaint that carries with it a high risk of complications and morbidity.  The differential diagnosis includes hepatic enceph, anemia, electrolyte abn, infection, dka   Co morbidities that complicate the patient evaluation  HTN, DM2, HLD, arthritis, CAD, CHF, and NASH cirrhosis   Additional history obtained:  Additional history obtained from epic chart review External records from outside source obtained and reviewed including EMS report   Lab Tests:  I Ordered, and personally interpreted labs.  The pertinent results include:  cbc with hgb 8 (hgb 8.9 on 12/18); vbg with ph 7.53, pCO2 at 40; cmp with glucose elevated at 416, bun 84 and cr 1.85 (bun 33 and cr 1.70 7 days ago); covid +   Imaging Studies ordered:  I ordered imaging studies including cxr and ct head  I independently visualized and interpreted imaging which showed  CXR: IMPRESSION:  No active disease.  CT head: . No acute intracranial findings.  2. Chronic microvascular ischemic change and cerebral volume loss.  3. Right maxillary sinus disease.   I agree with the radiologist interpretation   Cardiac Monitoring:  The patient was maintained on a  cardiac monitor.  I personally viewed and interpreted the cardiac monitored which showed an underlying rhythm of: nsr   Medicines ordered and prescription drug management:  I ordered medication including ivfs  for dehydration  Reevaluation of the patient after these medicines showed that the patient improved I have reviewed the patients home medicines and have made adjustments as needed   Test Considered:  ct   Critical Interventions:  ivfs   Consultations Obtained:  I requested consultation with the hospitalist (Dr. Clearence Ped),  and discussed lab and imaging findings as well as pertinent plan - she will admit   Problem List / ED Course:  Covid-19:  pt is oxygenating well, but he is altered and has not been eating/drinking Anemia:  slightly worse than d/c.  Will need to follow. Hyperglycemia and dehydration:  ivfs given   Reevaluation:  After the interventions noted above, I reevaluated the patient and found that they have :improved   Social Determinants of Health:  Lives in snf   Dispostion:  After consideration of the diagnostic results and the patients response to treatment, I feel that the patent would benefit from admission.    CRITICAL CARE Performed by: Isla Pence   Total critical care time: 30 minutes  Critical care time was exclusive of separately billable procedures and treating other patients.  Critical care was necessary to treat or prevent imminent or life-threatening deterioration.  Critical care was time  spent personally by me on the following activities: development of treatment plan with patient and/or surrogate as well as nursing, discussions with consultants, evaluation of patient's response to treatment, examination of patient, obtaining history from patient or surrogate, ordering and performing treatments and interventions, ordering and review of laboratory studies, ordering and review of radiographic studies, pulse oximetry and  re-evaluation of patient's condition.   [name] was evaluated in Emergency Department on 12/02/2022 for the symptoms described in the history of present illness. He was evaluated in the context of the global COVID-19 pandemic, which necessitated consideration that the patient might be at risk for infection with the SARS-CoV-2 virus that causes COVID-19. Institutional protocols and algorithms that pertain to the evaluation of patients at risk for COVID-19 are in a state of rapid change based on information released by regulatory bodies including the CDC and federal and state organizations. These policies and algorithms were followed during the patient's care in the ED.        Final Clinical Impression(s) / ED Diagnoses Final diagnoses:  [id number]  Acute metabolic encephalopathy  Dehydration  Hyperglycemia due to diabetes mellitus (HCC)  Anemia, unspecified type  Stage 3b chronic kidney disease ([place])  Pressure injury of skin of sacral region, unspecified injury stage    Rx / DC Orders ED Discharge Orders     None         Isla Pence, MD 12/05/2022 2348

## 2022-12-07 NOTE — ED Triage Notes (Signed)
Pt arrived via [facility] from [facility] due to AMS and high blood sugar 469. Hx of diabetes, not alert to situation, Per EMS, pt typically talks but has not today, just groans

## 2022-12-08 DIAGNOSIS — E860 Dehydration: Secondary | ICD-10-CM | POA: Diagnosis not present

## 2022-12-08 DIAGNOSIS — E7220 Disorder of urea cycle metabolism, unspecified: Secondary | ICD-10-CM

## 2022-12-08 DIAGNOSIS — E119 Type 2 diabetes mellitus without complications: Secondary | ICD-10-CM

## 2022-12-08 DIAGNOSIS — U071 COVID-19: Secondary | ICD-10-CM | POA: Diagnosis not present

## 2022-12-08 DIAGNOSIS — L89152 Pressure ulcer of sacral region, stage 2: Secondary | ICD-10-CM

## 2022-12-08 DIAGNOSIS — I2510 Atherosclerotic heart disease of native coronary artery without angina pectoris: Secondary | ICD-10-CM

## 2022-12-08 DIAGNOSIS — K7469 Other cirrhosis of liver: Secondary | ICD-10-CM

## 2022-12-08 DIAGNOSIS — E782 Mixed hyperlipidemia: Secondary | ICD-10-CM

## 2022-12-08 DIAGNOSIS — G9341 Metabolic encephalopathy: Secondary | ICD-10-CM | POA: Diagnosis not present

## 2022-12-08 LAB — CBC WITH DIFFERENTIAL/PLATELET
Abs Immature Granulocytes: 0.03 10*3/uL (ref 0.00–0.07)
Basophils Absolute: 0 10*3/uL (ref 0.0–0.1)
Basophils Relative: 0 %
Eosinophils Absolute: 0 10*3/uL (ref 0.0–0.5)
Eosinophils Relative: 1 %
HCT: 25 % — ABNORMAL LOW (ref 39.0–52.0)
Hemoglobin: 8 g/dL — ABNORMAL LOW (ref 13.0–17.0)
Immature Granulocytes: 1 %
Lymphocytes Relative: 4 %
Lymphs Abs: 0.3 10*3/uL — ABNORMAL LOW (ref 0.7–4.0)
MCH: 31.6 pg (ref 26.0–34.0)
MCHC: 32 g/dL (ref 30.0–36.0)
MCV: 98.8 fL (ref 80.0–100.0)
Monocytes Absolute: 1 10*3/uL (ref 0.1–1.0)
Monocytes Relative: 16 %
Neutro Abs: 5 10*3/uL (ref 1.7–7.7)
Neutrophils Relative %: 78 %
Platelets: 121 10*3/uL — ABNORMAL LOW (ref 150–400)
RBC: 2.53 MIL/uL — ABNORMAL LOW (ref 4.22–5.81)
RDW: 16.3 % — ABNORMAL HIGH (ref 11.5–15.5)
WBC: 6.4 10*3/uL (ref 4.0–10.5)
nRBC: 0 % (ref 0.0–0.2)

## 2022-12-08 LAB — GLUCOSE, CAPILLARY
Glucose-Capillary: 290 mg/dL — ABNORMAL HIGH (ref 70–99)
Glucose-Capillary: 261 mg/dL — ABNORMAL HIGH (ref 70–99)
Glucose-Capillary: 254 mg/dL — ABNORMAL HIGH (ref 70–99)
Glucose-Capillary: 257 mg/dL — ABNORMAL HIGH (ref 70–99)

## 2022-12-08 LAB — CBC
HCT: 23.9 % — ABNORMAL LOW (ref 39.0–52.0)
Hemoglobin: 7.7 g/dL — ABNORMAL LOW (ref 13.0–17.0)
MCH: 31.7 pg (ref 26.0–34.0)
MCHC: 32.2 g/dL (ref 30.0–36.0)
MCV: 98.4 fL (ref 80.0–100.0)
Platelets: 134 10*3/uL — ABNORMAL LOW (ref 150–400)
RBC: 2.43 MIL/uL — ABNORMAL LOW (ref 4.22–5.81)
RDW: 16.5 % — ABNORMAL HIGH (ref 11.5–15.5)
WBC: 4.8 10*3/uL (ref 4.0–10.5)
nRBC: 0 % (ref 0.0–0.2)

## 2022-12-08 LAB — COMPREHENSIVE METABOLIC PANEL
ALT: 10 U/L (ref 0–44)
AST: 43 U/L — ABNORMAL HIGH (ref 15–41)
Albumin: 4.4 g/dL (ref 3.5–5.0)
Alkaline Phosphatase: 69 U/L (ref 38–126)
Anion gap: 16 — ABNORMAL HIGH (ref 5–15)
BUN: 79 mg/dL — ABNORMAL HIGH (ref 8–23)
CO2: 27 mmol/L (ref 22–32)
Calcium: 10.2 mg/dL (ref 8.9–10.3)
Chloride: 98 mmol/L (ref 98–111)
Creatinine, Ser: 1.65 mg/dL — ABNORMAL HIGH (ref 0.61–1.24)
GFR, Estimated: 46 mL/min — ABNORMAL LOW (ref >60)
Glucose, Bld: 305 mg/dL — ABNORMAL HIGH (ref 70–99)
Potassium: 4.2 mmol/L (ref 3.5–5.1)
Sodium: 141 mmol/L (ref 135–145)
Total Bilirubin: 4.1 mg/dL — ABNORMAL HIGH (ref 0.3–1.2)
Total Protein: 9.1 g/dL — ABNORMAL HIGH (ref 6.5–8.1)

## 2022-12-08 LAB — PROCALCITONIN: Procalcitonin: 0.32 ng/mL

## 2022-12-08 LAB — LACTIC ACID, PLASMA: Lactic Acid, Venous: 3.2 mmol/L (ref 0.5–1.9)

## 2022-12-08 LAB — AMMONIA: Ammonia: 24 umol/L (ref 9–35)

## 2022-12-08 MED ORDER — HEPARIN SODIUM (PORCINE) 5000 UNIT/ML IJ SOLN
5000.0000 [IU] | Freq: Three times a day (TID) | INTRAMUSCULAR | Status: DC
  Administered 2022-12-08 – 2022-12-10 (×8): 5000 [IU] via SUBCUTANEOUS
  Filled 2022-12-08 (×8): qty 1

## 2022-12-08 MED ORDER — SPIRONOLACTONE 25 MG PO TABS
100.0000 mg | ORAL_TABLET | Freq: Every day | ORAL | Status: DC
  Filled 2022-12-08: qty 4

## 2022-12-08 MED ORDER — ALPRAZOLAM 0.25 MG PO TABS
0.2500 mg | ORAL_TABLET | Freq: Every day | ORAL | Status: DC
  Filled 2022-12-08: qty 1

## 2022-12-08 MED ORDER — LACTULOSE ENEMA
300.0000 mL | Freq: Once | ORAL | Status: AC
  Administered 2022-12-08: 300 mL via RECTAL
  Filled 2022-12-08: qty 300

## 2022-12-08 MED ORDER — INSULIN DETEMIR 100 UNIT/ML ~~LOC~~ SOLN
25.0000 [IU] | Freq: Every day | SUBCUTANEOUS | Status: DC
  Administered 2022-12-08: 25 [IU] via SUBCUTANEOUS
  Filled 2022-12-08 (×2): qty 0.25

## 2022-12-08 MED ORDER — MIDODRINE HCL 5 MG PO TABS
10.0000 mg | ORAL_TABLET | Freq: Three times a day (TID) | ORAL | Status: DC
  Filled 2022-12-08: qty 2

## 2022-12-08 MED ORDER — DEXTROSE-NACL 5-0.9 % IV SOLN: INTRAVENOUS | Status: AC

## 2022-12-08 MED ORDER — VENLAFAXINE HCL ER 75 MG PO CP24
75.0000 mg | ORAL_CAPSULE | Freq: Every day | ORAL | Status: DC
  Filled 2022-12-08: qty 1

## 2022-12-08 MED ORDER — ACETAMINOPHEN 325 MG PO TABS: 650.0000 mg | ORAL_TABLET | Freq: Four times a day (QID) | ORAL | Status: DC | PRN

## 2022-12-08 MED ORDER — TORSEMIDE 20 MG PO TABS: 60.0000 mg | ORAL_TABLET | Freq: Two times a day (BID) | ORAL | Status: DC

## 2022-12-08 MED ORDER — ONDANSETRON HCL 4 MG PO TABS: 4.0000 mg | ORAL_TABLET | Freq: Four times a day (QID) | ORAL | Status: DC | PRN

## 2022-12-08 MED ORDER — OXYCODONE HCL 5 MG PO TABS: 5.0000 mg | ORAL_TABLET | ORAL | Status: DC | PRN

## 2022-12-08 MED ORDER — INSULIN ASPART 100 UNIT/ML IJ SOLN
0.0000 [IU] | Freq: Three times a day (TID) | INTRAMUSCULAR | Status: DC
  Administered 2022-12-08 (×3): 8 [IU] via SUBCUTANEOUS
  Administered 2022-12-09: 5 [IU] via SUBCUTANEOUS
  Administered 2022-12-09 – 2022-12-10 (×4): 11 [IU] via SUBCUTANEOUS

## 2022-12-08 MED ORDER — ONDANSETRON HCL 4 MG/2ML IJ SOLN: 4.0000 mg | Freq: Four times a day (QID) | INTRAMUSCULAR | Status: DC | PRN

## 2022-12-08 MED ORDER — METOLAZONE 5 MG PO TABS: 2.5000 mg | ORAL_TABLET | ORAL | Status: DC

## 2022-12-08 MED ORDER — HEPARIN SODIUM (PORCINE) 5000 UNIT/ML IJ SOLN: 5000.0000 [IU] | Freq: Three times a day (TID) | INTRAMUSCULAR | Status: DC

## 2022-12-08 MED ORDER — INSULIN ASPART 100 UNIT/ML IJ SOLN
0.0000 [IU] | Freq: Every day | INTRAMUSCULAR | Status: DC
  Administered 2022-12-08: 3 [IU] via SUBCUTANEOUS
  Administered 2022-12-09: 2 [IU] via SUBCUTANEOUS

## 2022-12-08 MED ORDER — MORPHINE SULFATE (PF) 2 MG/ML IV SOLN
2.0000 mg | INTRAVENOUS | Status: DC | PRN
  Administered 2022-12-08 – 2022-12-14 (×24): 2 mg via INTRAVENOUS
  Filled 2022-12-08 (×27): qty 1

## 2022-12-08 MED ORDER — NADOLOL 20 MG PO TABS
20.0000 mg | ORAL_TABLET | Freq: Every day | ORAL | Status: DC
  Filled 2022-12-08 (×6): qty 1

## 2022-12-08 MED ORDER — ASPIRIN 81 MG PO TBEC: 81.0000 mg | DELAYED_RELEASE_TABLET | Freq: Every evening | ORAL | Status: DC

## 2022-12-08 MED ORDER — PRAVASTATIN SODIUM 10 MG PO TABS: 20.0000 mg | ORAL_TABLET | Freq: Every day | ORAL | Status: DC

## 2022-12-08 NOTE — Assessment & Plan Note (Signed)
-   Continue pravastatin - Continue to monitor

## 2022-12-08 NOTE — TOC Initial Note (Signed)
Transition of Care [facility]) - Initial/Assessment Note    Patient Details  Name: [name] MRN: [id number] Date of Birth: 06/23/1961  [facility]) CM/SW Contact:    Ihor Gully, LCSW Phone Number: 12/08/2022, 1:17 PM  Clinical Narrative:                 Patient admitted to [facility] on 12/19. He has experienced frequent admissions recently. He was admitted for acute metabolic encephalopathy, likely secondary to [place].    Expected Discharge Plan: Skilled Nursing Facility Barriers to Discharge: Continued Medical Work up   Patient Goals and CMS Choice Patient states their goals for this hospitalization and ongoing recovery are:: rehab          Expected Discharge Plan and Services                                              Prior Living Arrangements/Services                       Activities of Daily Living Home Assistive Devices/Equipment: Eyeglasses ADL Screening (condition at time of admission) Patient's cognitive ability adequate to safely complete daily activities?: No Is the patient deaf or have difficulty hearing?: No Does the patient have difficulty seeing, even when wearing glasses/contacts?: No Does the patient have difficulty concentrating, remembering, or making decisions?: Yes Patient able to express need for assistance with ADLs?: No Does the patient have difficulty dressing or bathing?: Yes Independently performs ADLs?: No Communication: Independent Dressing (OT): Dependent Is this a change from baseline?: Change from baseline, expected to last <3days Grooming: Dependent Is this a change from baseline?: Change from baseline, expected to last <3 days Feeding: Needs assistance Is this a change from baseline?: Change from baseline, expected to last <3 days Bathing: Dependent Is this a change from baseline?: Change from baseline, expected to last <3 days Toileting: Dependent Is this a change from baseline?:  Change from baseline, expected to last <3 days In/Out Bed: Dependent Is this a change from baseline?: Change from baseline, expected to last <3 days Walks in Home: Dependent Is this a change from baseline?: Change from baseline, expected to last <3 days Does the patient have difficulty walking or climbing stairs?: Yes Weakness of Legs: Both Weakness of Arms/Hands: Both  Permission Sought/Granted                  Emotional Assessment              Admission diagnosis:  Dehydration [O17.5] Acute metabolic encephalopathy [Z02.58] Anemia, unspecified type [D64.9] Stage 3b chronic kidney disease ([place]) [N18.32] Pressure injury of skin of sacral region, unspecified injury stage [L89.159] Hyperglycemia due to diabetes mellitus ([place]) [E11.65] COVID-19 [U07.1] Patient Active Problem List   Diagnosis Date Noted   COVID-19 virus infection 12/08/2022   Dehydration 12/08/2022   Hyperammonemia ([place]) 12/08/2022   Weakness 11/23/2022   Pressure injury of skin 11/23/2022   Ascites 11/18/2022   Other ascites 10/22/2022   Liver cirrhosis secondary to NASH ([place]) 10/22/2022   Decompensated hepatic cirrhosis ([place]) 10/22/2022   Chest pain 10/21/2022   Hypervolemia 10/21/2022   Abdominal pain, epigastric 10/21/2022   Nuclear sclerosis, left 08/24/2022   Nuclear sclerotic cataract of both eyes 04/09/2022   Pupillary miosis 04/09/2022   Regular astigmatism of both eyes 04/09/2022  Muscle spasm 08/14/2021   CAD (coronary artery disease) 08/14/2021   HLD (hyperlipidemia) 08/14/2021   Chronic idiopathic thrombocytopenia ([place]) 08/14/2021   Increased ammonia level 08/14/2021   Obese 08/14/2021   Fall at home/Back Pain and Ambulatory Dysfunction 08/14/2021   Hypotension due to hypovolemia 08/14/2021   Hypokalemia 39/76/7341   Acute metabolic encephalopathy 93/79/0240   Chronic kidney disease 07/21/2021   Pure hypercholesterolemia 07/21/2021   GI bleed 07/21/2021   Iron deficiency anemia  07/15/2021   Hyperkalemia 03/17/2021   Elevated troponin 03/17/2021   Back pain 03/17/2021   Hypertensive heart and kidney disease with heart failure and with chronic kidney disease stage III ([place]) 12/17/2020   Acute on chronic diastolic CHF (congestive heart failure) ([place]) 12/17/2020   CKD stage 3 due to type 2 diabetes mellitus ([place]) 12/17/2020   Hyperlipidemia associated with type 2 diabetes mellitus ([place]) 12/17/2020   Anxiety 12/17/2020   Chronic non-seasonal allergic rhinitis 12/17/2020   Hypotension 12/10/2020   Acute renal failure superimposed on stage 3b chronic kidney disease ([place]) 12/10/2020   Acute renal failure superimposed on stage 3b chronic kidney disease, unspecified acute renal failure type ([place]) 12/10/2020   AKI (acute kidney injury) ([place]) 12/09/2020   GERD (gastroesophageal reflux disease) 10/09/2020   Portal hypertension ([place]) 08/26/2020   Pre-transplant evaluation for liver transplant 08/26/2020   Atherosclerotic heart disease of native coronary artery without angina pectoris 07/17/2020   NASH cirrhosis of liver ([place]) 07/17/2020   Anasarca 07/17/2020   Heart failure, unspecified ([place]) 07/17/2020   Morbid obesity ([place]) 07/17/2020   Acute on chronic heart failure with preserved ejection fraction (HCC)    Liver failure ([place]) 06/20/2020   Esophageal varices ([place]) 01/09/2020   History of adenomatous polyp of colon 01/09/2020   Elevated AST (SGOT) 08/23/2019   Elevation of level of transaminase and lactic acid dehydrogenase (LDH) 08/23/2019   Diarrhea 07/17/2019   Hypersomnia, persistent 03/12/2019   Macrocytic anemia 01/18/2019   Other pancytopenia ([place]) 01/18/2019   Anemia 01/18/2019   Thrombocytopenia ([place]) 12/30/2018   Polyclonal gammopathy 12/30/2018   S/P total knee replacement, left 03/09/17 11/22/2017   Primary osteoarthritis of left knee 03/09/2017   History of nocturia 03/02/2017   OSA on CPAP 03/02/2016   Hypoxia 03/02/2016   Obstructive sleep apnea  (adult) (pediatric) 03/02/2016   Cyst of mediastinum 01/15/2016   S/P CABG x 4 08/29/2015   Obesity, Class III, BMI 40-49.9 (morbid obesity) ([place])    Hyperglycemia    Pain in the chest 08/23/2015   Non-ST elevation MI (NSTEMI) ([place]) 08/23/2015   Diabetes mellitus, type 2 controlled with neurological complications 97/35/3299   Essential hypertension 08/23/2015   Hyperlipidemia 08/23/2015   Diabetes mellitus type 2 in nonobese ([place]) 08/23/2015   Myocardial infarction ([place]) 08/23/2015   Type 2 diabetes mellitus with hyperglycemia ([place]) 2016   Class 2 obesity 2016   Hyponatremia 2016   Osteoarthritis, knee 05/24/2012   Knee pain 10/29/2011   Knee stiffness 10/29/2011   S/P right knee arthroscopy 10/26/2011   Medial meniscus, posterior horn derangement 09/07/2011   Lateral meniscus derangement 09/07/2011   OA (osteoarthritis) of knee 09/07/2011   Rotator cuff syndrome of left shoulder 08/19/2011   Right knee meniscal tear 08/19/2011   PCP:  [name], MD Pharmacy:   [facility] [place] [facility] [address] [place] [address] Phone: [phone] Fax: [phone]     Social Determinants of Health (SDOH) Social History: SDOH Screenings   Food Insecurity: No Food Insecurity (  12/08/2022)  Housing: Low Risk  (12/08/2022)  Transportation Needs: No Transportation Needs (12/08/2022)  Utilities: Not At Risk (12/08/2022)  Depression (PHQ2-9): Low Risk  (07/24/2020)  Tobacco Use: Low Risk  (11/29/2022)   SDOH Interventions: Housing Interventions: Intervention Not Indicated   Readmission Risk Interventions    11/22/2022    8:40 AM 08/25/2021    2:25 PM 07/22/2021   11:48 AM  Readmission Risk Prevention Plan  Transportation Screening Complete Complete Complete  HRI or [place]   Complete  Social Work Consult for [place] Planning/Counseling   Complete  Palliative Care Screening   Not Applicable  Medication Review Press photographer) Complete Complete  Complete  HRI or Home Care Consult Complete Complete   SW Recovery Care/Counseling Consult Complete Complete   Palliative Care Screening Not Applicable Not Applicable   Skilled Nursing Facility Not Applicable Complete

## 2022-12-08 NOTE — Assessment & Plan Note (Signed)
-   Ammonia 63 - Was 32 the last admission - Too altered to take p.o. - Rectal lactulose ordered - Trend ammonia in the a.m.

## 2022-12-08 NOTE — Assessment & Plan Note (Signed)
-   COVID-positive - Chest x-ray shows no active disease - No leukocytosis - Likely contributing to altered mental status - No oxygen requirement - In setting of liver disease will not start Paxil event - Expectorated sputum assessment - Continue to monitor

## 2022-12-08 NOTE — Progress Notes (Signed)
Patient set up on auto cpap with his home nasal pillows.  2L oxygen bled in.  Patient tolerating well at this time.

## 2022-12-08 NOTE — Progress Notes (Signed)
Same day note   [name] is a 61 y.o. male with medical history significant of CAD, CKD, cirrhosis, diastolic heart failure,, hypertension, hyperlipidemia, iron deficiency anemia, type 2 diabetes mellitus presented to the ED [facility] due to altered mental status.   Patient was reported to be hyperglycemic.  Of note patient was recently discharged on December 19 after admission for GI bleed secondary to varices.  He did have paracentesis while in the hospital at that time and was discharged to skilled nursing facility.    In the ED, patient had stable vitals.  pH was 7.5 with pCO2 of 40.  No leukocytosis.  Hemoglobin at 8.0.  Creatinine was slightly elevated.  Patient was hyperglycemic at 416.  Lactic acid was elevated at 3.8.  Patient tested positive for COVID.  CT head scan showed the chronic microvascular changes.  Chest x-ray was negative for acute findings.  Patient was then admitted hospital for further evaluation and treatment.  Patient seen and examined at bedside.  Patient was admitted to the hospital for altered mental status  At the time of my evaluation, patient morning only, slurred speech, moving extremities, incomprehensible speech.  Physical examination reveals distended abdomen with ascites.  Pressure ulceration over the sacrum.  Moaning speech, dry oral mucosa,  Laboratory data and imaging was reviewed  Assessment and Plan.   Acute metabolic encephalopathy  Likely multifactorial from COVID, dehydration, hepatic encephalopathy.  CT head with microvascular changes.  Ammonia was slightly elevated so rectal lactulose was ordered.  On a rectal tube at this time with some bowel movements.  Might need NG tube with lactulose administration since patient appears to be encephalopathic.  Will add D5 normal saline for 1 day since patient is n.p.o. encephalopathic.   Hyperammonemia/ history of cirrhosis of liver. Follow-up 63 on presentation.  Rectal lactulose was ordered.   Will continue to titrate for bowel movements 2-3 times a day.   Volume depletion/ dehydration. Received IV fluid bolus in the ED.  Lactate was initially at 3.8 with improvement.  Will add D5 normal saline for 1 day since patient is encephalopathic NPO.  COVID-19 virus infection - COVID-positive.  Chest x-ray without any infiltrate.  No leukocytosis.  Not requiring supplemental oxygen.  Patient does have liver disease so no Paxlovid at this time.  Pressure injury of skin No pressure ulcers noted over the sacrum.  Get the wound care consultation.   Diabetes mellitus type 2 in nonobese (HCC) - 52 units of Tresiba at home.  Continue 25 units basal plus sliding scale while in the hospital.  Latest POC glucose of 290.  NASH cirrhosis of liver ([place]) With multiple paracentesis in the past for ascites and recent admission for variceal bleeding.  Hemoglobin on discharge on last admission was 8.9.  Hemoglobin today at 8.0.  Atherosclerotic heart disease of native coronary artery without angina pectoris - Continue aspirin, nadolol, pravastatin, Aldactone, monitoring telemetry.   Hyperlipidemia - Continue pravastatin  No Charge  Signed,  Delila Pereyra, MD [facility]

## 2022-12-08 NOTE — Assessment & Plan Note (Signed)
-   Consult wound - 2 small pressure ulcers over sacrum

## 2022-12-08 NOTE — Assessment & Plan Note (Signed)
-   Known history of NASH cirrhosis - Ammonia elevated at 108, rectal lactulose ordered - Recurrent ascites with multiple paracentesis in the past - Previously admitted with GI bleed secondary to varices, hemoglobin currently 8.0 and will trend in the a.m. - Last discharge hemoglobin was 8.9 - Continue to monitor

## 2022-12-08 NOTE — Consult Note (Signed)
WOC Nurse Consult Note: Remote consult completed with review of electronic medical record.  Will implement skin care protocol for stage 2 pressure injuries.  Reason for Consult: sacral pressure injuries, present on admission.  Likely pressure and moisture are causative.  Recent Ascites with weight gain, GI bleed and edema present Wound type: stage 2 pressure injuries  Pressure Injury POA: Yes Measurement: 1 cm x 1 cm x 0.1 cm  Wound bed: pink and moist Drainage (amount, consistency, odor) minimal serosanguinous   Periwound: intact Dressing procedure/placement/frequency: Sacral silicone foam.  Change every three days and PRN soilage.  Will not follow at this time.  Please re-consult if needed.  Estrellita Ludwig MSN, RN, FNP-BC CWON Wound, Ostomy, Continence Nurse [facility] [phone] Pager [phone]

## 2022-12-08 NOTE — Assessment & Plan Note (Signed)
-   Mucous membranes significantly dry presentation - 2 L boluses in the ED - Lactic acid elevated 3.8, 3.0>> will take some time to clear given cirrhosis - Will trend lactic acid with a.m. labs - Given risk of fluid overload in cirrhotic patient, will not continue IV fluids - Hold torsemide - Reassess in a.m.

## 2022-12-08 NOTE — H&P (Signed)
History and Physical    Patient: [name] IOX:[id number] DOB: 08-05-1961 DOA: 11/17/2022 DOS: the patient was seen and examined on 12/08/2022 PCP: [name], MD  Patient coming from: SNF  Chief Complaint:  Chief Complaint  Patient presents with   Altered Mental Status   HPI: [name] is a 61 y.o. male with medical history significant of CAD, CKD, cirrhosis, diastolic CHF, hypertension, hyperlipidemia, iron deficiency anemia, type 2 diabetes mellitus, presents the ED from [facility] due to altered mental status. Patient was also hyperglycemic with a glucose of 4 and 69.  Unfortunately history could not be obtained from the patient. Patient was recently discharged from the hospital on December 19 after a stay for GI bleed secondary to varices.  He also had paracentesis while in the hospital before.  He was discharged to a skilled nursing facility.  Patient is chronically ill-appearing, and is difficult to tell how long he is been this ill.  He does have atrophy, and microvascular ischemic disease on his CT head, so this could be a stepwise dementia.  Patient's wife was here earlier and reported that he had not been talking for 3 days, he just groaning.  He seems tender in his entire body.  He has not eaten since Christmas Day.  He is refusing his meds.  Full code by default as patient is not able to have this discussion at this time  Review of Systems: unable to review all systems due to the inability of the patient to answer questions. Past Medical History:  Diagnosis Date   Anemia    Arthritis    CAD (coronary artery disease)    Multivessel disease status post CABG 08/2015   Cirrhosis ([place])    CKD (chronic kidney disease) stage 3, GFR 30-59 ml/min (HCC)    Diastolic CHF (HCC)    Essential hypertension    Hyperlipidemia    Iron deficiency anemia 07/15/2021   OSA on CPAP    Polyclonal gammopathy    Thrombocytopenia (HCC) 2016   Type 2 diabetes mellitus  ([place])    Past Surgical History:  Procedure Laterality Date   APPENDECTOMY     Biceps tendon surgery Right    BIOPSY  11/22/2019   Procedure: BIOPSY;  Surgeon: Daneil Dolin, MD;  [facility];  Service: Endoscopy;;   CARDIAC CATHETERIZATION N/A 08/25/2015   Procedure: Left Heart Cath and Coronary Angiography;  Surgeon: Belva Crome, MD;  Location: [place] [facility];  Service: Cardiovascular;  Laterality: N/A;   CATARACT EXTRACTION Left 08/18/2022   COLONOSCOPY WITH PROPOFOL N/A 11/22/2019   Procedure: COLONOSCOPY WITH PROPOFOL;  Surgeon: Daneil Dolin, MD; Four 4-5 mm polyps, findings suggestive of portal colopathy, congested appearing colonic mucosa diffusely, rectal varices, and adequate right colon prep.  Pathology with tubular adenomas and hyperplastic polyp.  Right colon biopsy with focal active colitis.  Recommendations to repeat colonoscopy in 3 months due to poor prep.   COLONOSCOPY WITH PROPOFOL N/A 03/17/2020   Procedure: COLONOSCOPY WITH PROPOFOL;  Surgeon: Daneil Dolin, MD;  [facility];  Service: Endoscopy;  Laterality: N/A;  8:45am - pt does not need covid test, was + 2/4 <90 days   CORONARY ARTERY BYPASS GRAFT N/A 08/29/2015   Procedure: CORONARY ARTERY BYPASS GRAFTING (CABG);  Surgeon: [name], MD;  Location: [place];  Service: Open Heart Surgery;  Laterality: N/A;   ESOPHAGEAL BANDING N/A 07/23/2021   Procedure: ESOPHAGEAL BANDING;  Surgeon: Hurshel Keys  K, DO;  [facility];  Service: Endoscopy;  Laterality: N/A;   ESOPHAGEAL BANDING N/A 11/25/2022   Procedure: ESOPHAGEAL BANDING;  Surgeon: Daneil Dolin, MD;  [facility];  Service: Endoscopy;  Laterality: N/A;   ESOPHAGOGASTRODUODENOSCOPY (EGD) WITH PROPOFOL N/A 11/22/2019   Procedure: ESOPHAGOGASTRODUODENOSCOPY (EGD) WITH PROPOFOL;  Surgeon: Daneil Dolin, MD; 4 columns of grade 2-3 esophageal varices, portal gastropathy, gastric polyp/abnormal gastric  mucosa s/p biopsy.  Pathology with hyperplastic polyp, mild chronic gastritis, negative H. pylori.   ESOPHAGOGASTRODUODENOSCOPY (EGD) WITH PROPOFOL N/A 07/23/2021   Grade 2 esophageal varices without stigmata of bleeding, completely eradicated with banding, portal gastropathy, GAVE without bleeding.   ESOPHAGOGASTRODUODENOSCOPY (EGD) WITH PROPOFOL N/A 11/25/2022   Procedure: ESOPHAGOGASTRODUODENOSCOPY (EGD) WITH PROPOFOL;  Surgeon: Daneil Dolin, MD;  [facility];  Service: Endoscopy;  Laterality: N/A;   KNEE ARTHROSCOPY Left    TEE WITHOUT CARDIOVERSION N/A 08/29/2015   Procedure: TRANSESOPHAGEAL ECHOCARDIOGRAM (TEE);  Surgeon: [name], MD;  Location: [place];  Service: Open Heart Surgery;  Laterality: N/A;   TOTAL KNEE ARTHROPLASTY Left 03/09/2017   Procedure: TOTAL KNEE ARTHROPLASTY;  Surgeon: Carole Civil, MD;  Location[facility];  Service: Orthopedics;  Laterality: Left;   Social History:  reports that he has never smoked. He has never used smokeless tobacco. He reports that he does not currently use alcohol. He reports that he does not use drugs.  Allergies  Allergen Reactions   Fluticasone Rash    Family History  Problem Relation Age of Onset   Arthritis Other    Cancer Other    Diabetes Other    CAD Father    Diabetes Mellitus II Father    Liver cancer Father    Hodgkin's lymphoma Father    CAD Brother    Diabetes Mellitus II Brother    ALS Mother    Diabetes Mellitus II Sister    Diabetes Mellitus II Brother    Diabetes Mellitus II Maternal Grandmother    Aneurysm Maternal Grandmother    Cancer Maternal Grandfather    Anesthesia problems Neg Hx    Hypotension Neg Hx    Malignant hyperthermia Neg Hx    Pseudochol deficiency Neg Hx    Colon cancer Neg Hx     Prior to Admission medications   Medication Sig Start Date End Date Taking? Authorizing Provider  acetaminophen (TYLENOL) 325 MG tablet Take 650 mg by mouth every 6 (six) hours as  needed for mild pain. 12/19/20   [provider]  albuterol (VENTOLIN HFA) 108 (90 Base) MCG/ACT inhaler Inhale 2 puffs into the lungs every 6 (six) hours as needed for wheezing or shortness of breath. 12/22/20   Gerlene Fee, NP  ALPRAZolam Duanne Moron) 0.25 MG tablet Take 1 tablet (0.25 mg total) by mouth daily. 11/30/22   Manuella Ghazi, Pratik D, DO  Armodafinil 50 MG tablet Take 1 tablet (50 mg total) by mouth as needed. 06/03/22   Dohmeier, Asencion Partridge, MD  aspirin EC 81 MG tablet Take 1 tablet (81 mg total) by mouth every evening. 10/23/22   Johnson, Clanford L, MD  Cholecalciferol (DIALYVITE VITAMIN D 5000) 125 MCG (5000 UT) capsule Take 5,000 Units by mouth in the morning.     [provider]  Continuous Blood Gluc Receiver (DEXCOM G5 RECEIVER KIT) DEVI Use to check/monitor sugar. 11/23/22   Barton Dubois, MD  Continuous Blood Gluc Sensor (FREESTYLE LIBRE 14 DAY SENSOR) MISC SMARTSIG:1 Each Topical Every 2 Weeks  10/03/21   [provider]  insulin aspart (NOVOLOG) 100 UNIT/ML injection Inject 5 Units into the skin 3 (three) times daily before meals. Special Instructions: If accu-check is greater than 150. Hold for accu-check 150 or below. With Meals 12/22/20   Gerlene Fee, NP  insulin degludec (TRESIBA) 100 UNIT/ML FlexTouch Pen Inject 52 Units into the skin daily. 11/07/22 02/05/23  [provider]  lactulose (CHRONULAC) 10 GM/15ML solution Take 30 mLs (20 g total) by mouth daily. 12/22/20   Gerlene Fee, NP  loratadine (CLARITIN) 10 MG tablet Take 10 mg by mouth in the morning.     [provider]  metolazone (ZAROXOLYN) 2.5 MG tablet Take 1 tablet (2.5 mg total) by mouth once a week. Every Wedenesday 11/23/22   Barton Dubois, MD  midodrine (PROAMATINE) 10 MG tablet Take 1 tablet (10 mg total) by mouth 3 (three) times daily with meals. 08/25/21   Roxan Hockey, MD  nadolol (CORGARD) 20 MG tablet Take 1 tablet (20 mg total) by mouth daily. 06/07/22   Imogene Burn, PA-C  nitroGLYCERIN (NITROSTAT) 0.4 MG SL tablet Place 1 tablet (0.4 mg total) under the tongue every 5 (five) minutes as needed for chest pain. Do not exceed 3 doses in 15 mins 11/04/21 09/08/22  Hilty, Nadean Corwin, MD  oxyCODONE (OXY IR/ROXICODONE) 5 MG immediate release tablet Take 1 tablet (5 mg total) by mouth every 4 (four) hours as needed for moderate pain. 11/30/22   Manuella Ghazi, Pratik D, DO  pantoprazole (PROTONIX) 40 MG tablet Take 1 tablet (40 mg total) by mouth daily. 08/25/21 09/08/22  Roxan Hockey, MD  potassium chloride SA (KLOR-CON M) 20 MEQ tablet TAKE 1 TABLET(20 MEQ) BY MOUTH DAILY 09/27/22   Satira Sark, MD  pravastatin (PRAVACHOL) 20 MG tablet TAKE 1 TABLET(20 MG) BY MOUTH AT BEDTIME 09/08/22   Strader, [place] M, PA-C  spironolactone (ALDACTONE) 100 MG tablet Take 1 tablet (100 mg total) by mouth daily. 10/23/22   Johnson, Clanford L, MD  torsemide (DEMADEX) 20 MG tablet Take 3 tablets (60 mg total) by mouth 2 (two) times daily. 11/08/22 11/03/23  Satira Sark, MD  venlafaxine XR (EFFEXOR-XR) 75 MG 24 hr capsule Take 1 capsule (75 mg total) by mouth daily with breakfast. 08/25/21   Roxan Hockey, MD    Physical Exam: Vitals:   12/08/22 0030 12/08/22 0110 12/08/22 0330 12/08/22 0345  BP: (!) 96/54  122/67   Pulse: 81 78 84 78  Resp: 20 17 (!) 21 20  Temp:      TempSrc:      SpO2: 99%  97% 94%   1.  General: Patient lying supine in bed, chronically ill-appearing   2. Psychiatric: Nonverbal, moaning incoherently, not following commands   3. Neurologic: Nonverbal and not following commands, seems to be moving all 4 extremities   4. HEENMT:  Head is atraumatic, normocephalic, pupils reactive to light, neck is supple, trachea is midline, mucous membranes are dry, blood at the corner of his mouth   5. Respiratory : Lungs are clear to auscultation bilaterally without wheezing, rhonchi, rales, no cyanosis, no increase in work of breathing or accessory  muscle use   6. Cardiovascular : Heart rate normal, rhythm is regular, murmur present, rubs or gallops, peripheral edema present, peripheral pulses palpated   7. Gastrointestinal:  Abdomen is soft, with ascites, possibly tender in the lower right quadrant as patient was pushing me away, bowel sounds active, no masses or organomegaly palpated  8. Skin:  2 small pressure ulcers over sacrum   9.Musculoskeletal:  No acute deformities or trauma, no asymmetry in tone, peripheral edema present peripheral pulses palpated, no tenderness to palpation in the extremities  Data Reviewed: In the ED Temp 98.5, heart rate 80-90, respiratory rate 16-22, blood pressure 112/54-135/77, satting 95% pH 7.53, pCO2 40 No leukocytosis with white blood cell count of 6.5, hemoglobin 8.0 Chemistries unremarkable except for a bump in creatinine which seems to be consistent with previous uptrend of creatinine Hyperglycemic at 416 Lactic acid 3.8 and then 3.0 COVID-positive CT head shows chronic microvascular ischemic changes and cerebral volume loss Chest x-ray shows no active disease Admission requested for further workup of acute metabolic encephalopathy and COVID 19 infection  Assessment and Plan: * Acute metabolic encephalopathy - Nonverbal - Incoherently moans - Likely secondary to COVID - Dehydration could also be contributing - CT head shows chronic microvascular ischemic changes and cerebral volume loss - Ammonia slightly elevated-rectal lactulose ordered - Continue to monitor  Hyperammonemia (HCC) - Ammonia 63 - Was 32 the last admission - Too altered to take p.o. - Rectal lactulose ordered - Trend ammonia in the a.m.  Dehydration - Mucous membranes significantly dry presentation - 2 L boluses in the ED - Lactic acid elevated 3.8, 3.0>> will take some time to clear given cirrhosis - Will trend lactic acid with a.m. labs - Given risk of fluid overload in cirrhotic patient, will not  continue IV fluids - Hold torsemide - Reassess in a.m.  COVID-19 virus infection - COVID-positive - Chest x-ray shows no active disease - No leukocytosis - Likely contributing to altered mental status - No oxygen requirement - In setting of liver disease will not start Paxil event - Expectorated sputum assessment - Continue to monitor  Pressure injury of skin - Consult wound - 2 small pressure ulcers over sacrum  Diabetes mellitus type 2 in nonobese (HCC) - 52 units of Tresiba at home - 8 units of short acting insulin given in the ED - Continue 25 units basal insulin, sliding scale coverage - Hyperglycemic at 416 in the ED - Bicarb 29 - Continue to monitor  NASH cirrhosis of liver ([place]) - Known history of NASH cirrhosis - Ammonia elevated at 68, rectal lactulose ordered - Recurrent ascites with multiple paracentesis in the past - Previously admitted with GI bleed secondary to varices, hemoglobin currently 8.0 and will trend in the a.m. - Last discharge hemoglobin was 8.9 - Continue to monitor  Atherosclerotic heart disease of native coronary artery without angina pectoris - Continue aspirin, nadolol, pravastatin, Aldactone - Monitor on telemetry  Hyperlipidemia - Continue pravastatin - Continue to monitor      Advance Care Planning:   Code Status: Full Code   Consults: None  Family Communication: No family at bedside  Severity of Illness: The appropriate patient status for this patient is INPATIENT. Inpatient status is judged to be reasonable and necessary in order to provide the required intensity of service to ensure the patient's safety. The patient's presenting symptoms, physical exam findings, and initial radiographic and laboratory data in the context of their chronic comorbidities is felt to place them at high risk for further clinical deterioration. Furthermore, it is not anticipated that the patient will be medically stable for discharge from the hospital  within 2 midnights of admission.   * I certify that at the point of admission it is my clinical judgment that the patient will require inpatient hospital care spanning beyond 2  midnights from the point of admission due to high intensity of service, high risk for further deterioration and high frequency of surveillance required.*  Author: Rolla Plate, DO 12/08/2022 4:07 AM  For on call review [email or web address].

## 2022-12-08 NOTE — Assessment & Plan Note (Signed)
-   52 units of Tresiba at home - 8 units of short acting insulin given in the ED - Continue 25 units basal insulin, sliding scale coverage - Hyperglycemic at 416 in the ED - Bicarb 29 - Continue to monitor

## 2022-12-08 NOTE — IPAL (Signed)
  Interdisciplinary Goals of Care Family Meeting   Date carried out: 12/08/2022  Location of the meeting: Bedside  Member's involved: Physician, Bedside Registered Nurse,  and Family Member or next of kin  Durable Power of Attorney or acting medical decision maker: [name]     Discussion: We discussed goals of care for [name] .  I had a prolonged discussion with the patient's decision maker Ms. [name] at bedside..  Explained the prognosis of the patient in the context of CKD cirrhosis of liver heart failure hypertension hyperlipidemia type 2 diabetes with recurrent admission with current encephalopathy dehydration.  Patient's spouse expressed that he had been declining  and feels like he is starting to suffer.  Patient had expressed that he did not want to continue aggressive treatment.  At this time patient's wife feels like DNR/DNI would be appropriate and would like to talk with palliative care.  Will continue to follow one day at a time to see how the patient progresses.  If continues to decline could consider comfort based approach but will see how things go.  Patient's wife states that she has a lot going on to process.  Code status: DNR  Disposition: Continue current acute care, consult palliative care.  Time spent for the meeting: 45 minutes    Flora Lipps, MD  12/08/2022, 3:22 PM

## 2022-12-08 NOTE — Assessment & Plan Note (Signed)
-   Continue aspirin, nadolol, pravastatin, Aldactone - Monitor on telemetry

## 2022-12-08 NOTE — Assessment & Plan Note (Signed)
-   Nonverbal - Incoherently moans - Likely secondary to COVID - Dehydration could also be contributing - CT head shows chronic microvascular ischemic changes and cerebral volume loss - Ammonia slightly elevated-rectal lactulose ordered - Continue to monitor

## 2022-12-09 DIAGNOSIS — Z515 Encounter for palliative care: Secondary | ICD-10-CM

## 2022-12-09 DIAGNOSIS — E860 Dehydration: Secondary | ICD-10-CM | POA: Diagnosis not present

## 2022-12-09 DIAGNOSIS — I2510 Atherosclerotic heart disease of native coronary artery without angina pectoris: Secondary | ICD-10-CM | POA: Diagnosis not present

## 2022-12-09 DIAGNOSIS — G9341 Metabolic encephalopathy: Secondary | ICD-10-CM | POA: Diagnosis not present

## 2022-12-09 DIAGNOSIS — Z7189 Other specified counseling: Secondary | ICD-10-CM

## 2022-12-09 DIAGNOSIS — U071 COVID-19: Secondary | ICD-10-CM | POA: Diagnosis not present

## 2022-12-09 LAB — MAGNESIUM: Magnesium: 2.2 mg/dL (ref 1.7–2.4)

## 2022-12-09 LAB — GLUCOSE, CAPILLARY
Glucose-Capillary: 326 mg/dL — ABNORMAL HIGH (ref 70–99)
Glucose-Capillary: 320 mg/dL — ABNORMAL HIGH (ref 70–99)
Glucose-Capillary: 228 mg/dL — ABNORMAL HIGH (ref 70–99)
Glucose-Capillary: 202 mg/dL — ABNORMAL HIGH (ref 70–99)

## 2022-12-09 LAB — CBC
HCT: 24.8 % — ABNORMAL LOW (ref 39.0–52.0)
Hemoglobin: 7.7 g/dL — ABNORMAL LOW (ref 13.0–17.0)
MCH: 31.4 pg (ref 26.0–34.0)
MCHC: 31 g/dL (ref 30.0–36.0)
MCV: 101.2 fL — ABNORMAL HIGH (ref 80.0–100.0)
Platelets: 128 10*3/uL — ABNORMAL LOW (ref 150–400)
RBC: 2.45 MIL/uL — ABNORMAL LOW (ref 4.22–5.81)
RDW: 16.8 % — ABNORMAL HIGH (ref 11.5–15.5)
WBC: 5.2 10*3/uL (ref 4.0–10.5)
nRBC: 0 % (ref 0.0–0.2)

## 2022-12-09 LAB — BASIC METABOLIC PANEL
Anion gap: 11 (ref 5–15)
BUN: 74 mg/dL — ABNORMAL HIGH (ref 8–23)
CO2: 32 mmol/L (ref 22–32)
Calcium: 10 mg/dL (ref 8.9–10.3)
Chloride: 104 mmol/L (ref 98–111)
Creatinine, Ser: 1.72 mg/dL — ABNORMAL HIGH (ref 0.61–1.24)
GFR, Estimated: 44 mL/min — ABNORMAL LOW (ref >60)
Glucose, Bld: 313 mg/dL — ABNORMAL HIGH (ref 70–99)
Potassium: 3.2 mmol/L — ABNORMAL LOW (ref 3.5–5.1)
Sodium: 147 mmol/L — ABNORMAL HIGH (ref 135–145)

## 2022-12-09 MED ORDER — POTASSIUM CHLORIDE 2 MEQ/ML IV SOLN: INTRAVENOUS | Status: DC

## 2022-12-09 MED ORDER — KCL IN DEXTROSE-NACL 20-5-0.45 MEQ/L-%-% IV SOLN: INTRAVENOUS | Status: DC

## 2022-12-09 MED ORDER — INSULIN DETEMIR 100 UNIT/ML ~~LOC~~ SOLN
20.0000 [IU] | Freq: Two times a day (BID) | SUBCUTANEOUS | Status: DC
  Administered 2022-12-09 – 2022-12-10 (×3): 20 [IU] via SUBCUTANEOUS
  Filled 2022-12-09 (×5): qty 0.2

## 2022-12-09 MED ORDER — POTASSIUM CHLORIDE CRYS ER 20 MEQ PO TBCR: 40.0000 meq | EXTENDED_RELEASE_TABLET | Freq: Once | ORAL | Status: DC

## 2022-12-09 MED ORDER — DEXTROSE-NACL 5-0.9 % IV SOLN: INTRAVENOUS | Status: DC

## 2022-12-09 NOTE — Progress Notes (Signed)
Palliative: Thank you for this consult.  Unfortunately, due to illness/staffing issues there will be a delay in a Palliative Provider seeing this patient. Palliative Medicine will return to service on 07-Jan-2023 and will see patient at that time, if patient is still hospitalized.  No charge  Quinn Axe, NP Palliative Medicine Please call Palliative Medicine team phone with any questions [phone]. For individual providers please see AMION.

## 2022-12-09 NOTE — TOC Progression Note (Signed)
Transition of Care [facility]) - Progression Note    Patient Details  Name: [name] MRN: [id number] Date of Birth: 14-Aug-1961  [facility] of [facility]) CM/SW Contact  Ihor Gully, LCSW Phone Number: 12/09/2022, 4:14 PM  Clinical Narrative:    Per attending request, patient referred to [facility]. Spouse confirms she is interested in [facility].    Expected Discharge Plan: [place] Barriers to Discharge: Continued Medical Work up  Expected Discharge Plan and Services                                               Social Determinants of Health (SDOH) Interventions SDOH Screenings   Food Insecurity: No Food Insecurity (12/08/2022)  Housing: Low Risk  (12/08/2022)  Transportation Needs: No Transportation Needs (12/08/2022)  Utilities: Not At Risk (12/08/2022)  Depression (PHQ2-9): Low Risk  (07/24/2020)  Tobacco Use: Low Risk  (11/12/2022)    Readmission Risk Interventions    11/22/2022    8:40 AM 08/25/2021    2:25 PM 07/22/2021   11:48 AM  Readmission Risk Prevention Plan  Transportation Screening Complete Complete Complete  HRI or [place]   Complete  Social Work Consult for [place] Planning/Counseling   Complete  Palliative Care Screening   Not Applicable  Medication Review Press photographer) Complete Complete Complete  HRI or Home Care Consult Complete Complete   SW Recovery Care/Counseling Consult Complete Complete   Palliative Care Screening Not Applicable Not Applicable   Skilled Nursing Facility Not Applicable Complete

## 2022-12-09 NOTE — Progress Notes (Addendum)
PROGRESS NOTE    [name]  KKX:[id number] DOB: 1961-04-11 DOA: 11/28/2022 PCP: [name], MD    Brief Narrative:   [name] is a 61 y.o. male with medical history significant of CAD, CKD, cirrhosis, diastolic heart failure,, hypertension, hyperlipidemia, iron deficiency anemia, type 2 diabetes mellitus presented to the ED [facility] due to altered mental status.   Patient was reported to be hyperglycemic.  Of note patient was recently discharged on December 19 after admission for GI bleed secondary to varices.  He did have paracentesis while in the hospital at that time and was discharged to skilled nursing facility.     In the ED, patient had stable vitals. ABG showed  pH was 7.5 with pCO2 of 40.  No leukocytosis..  Hemoglobin at 8.0.  Creatinine was slightly elevated.  Patient was hyperglycemic at 416.  Lactic acid was elevated at 3.8.  Patient tested positive for COVID.  CT head scan showed the chronic microvascular changes.  Chest x-ray was negative for acute findings.  Patient was then admitted hospital for further evaluation and treatment.   Assessment and Plan:  Acute metabolic encephalopathy  Likely multifactorial from COVID, dehydration, hepatic encephalopathy.  CT head with microvascular changes.  Ammonia was slightly elevated so  lactulose was ordered. On D5 drip since the patient is NPO.  Hyperammonemia/ history of cirrhosis of liver. Ammonia 63 on presentation. On  lactulose, aim for 2-3 bowel movements a day.  Hypokalemia.  Continue IV potassium with fluids.  On D5 with KCl.   Volume depletion/ dehydration/hypernatremia. Received IV fluid bolus in the ED.  Lactate was initially at 3.8 with improvement.  Continue D5  since patient is encephalopathic NPO.   COVID-19 virus infection - COVID-positive.  Chest x-ray without any infiltrate.  No leukocytosis.  Not requiring supplemental oxygen.  Patient does have liver disease so no Paxlovid at this  time.   Pressure injury of skin No pressure ulcers noted over the sacrum.     Diabetes mellitus type 2 in nonobese (HCC) - 52 units of Tresiba at home.  Continue long-acting sliding scale insulin while in the hospital.  Latest POC glucose of 302.  Patient is currently on D5 water as well.   NASH cirrhosis of liver ([place]) With multiple paracentesis in the past for ascites and recent admission for variceal bleeding.  Hemoglobin on discharge on last admission was 8.9.  Hemoglobin today at 7.7 from hemodilution.   Atherosclerotic heart disease of native coronary artery without angina pectoris on aspirin, nadolol, pravastatin, Aldactone, monitoring telemetry.   Hyperlipidemia - Continue pravastatin  Acute kidney injury.  Creatinine at 1.7.  Secondary to poor oral intake, liver disease.  Creatinine of 1.6 yesterday.  Goals of care. Had a long discussion with multiple family members yesterday.  Plan is hospice referral at this time.  Critical care has been consulted.   DVT prophylaxis: heparin injection 5,000 Units Start: 12/08/22 0600 SCDs Start: 12/08/22 0031   Code Status:     Code Status: DNR  Disposition: Plan for hospice, TOC has been consulted.  Status is: Inpatient Remains inpatient appropriate because: Metabolic encephalopathy, IV fluids, multiple comorbidities, potential hospice placement   Family Communication: Spoke with the patient and multiple family members at bedside at length on 12/09/2022 . Consultants:  Palliative care  Procedures:  None  Antimicrobials:  None  Anti-infectives (From admission, onward)    None       Subjective: Today, patient was seen and examined at bedside.  Patient has not improved much with moaning and refusing any intervention.  No meaningful interaction. Objective: Vitals:   12/08/22 2151 12/09/22 0420 12/09/22 0900 12/09/22 1346  BP:  109/87 118/75 121/76  Pulse: 89 85 86 85  Resp: 20 16 17 18   Temp:  98.8 F (37.1 C) 99.5  F (37.5 C) 99.2 F (37.3 C)  TempSrc:   Oral Oral  SpO2: 94% 96% 95% 95%  Weight:      Height:        Intake/Output Summary (Last 24 hours) at 12/09/2022 1439 Last data filed at 12/09/2022 1300 Gross per 24 hour  Intake 1237.88 ml  Output 400 ml  Net 837.88 ml   Filed Weights   12/08/22 0803 12/08/22 0814  Weight: 94.2 kg 94.2 kg    Physical Examination: Body mass index is 29.38 kg/m.  General: Patient is weak and deconditioned frail and moaning HENT:   No scleral pallor or icterus noted. Oral mucosa is dry mucosa Chest: Diminished breath sounds noted. CVS: S1 &S2 heard. No murmur.  Regular rate and rhythm. Abdomen: Soft, distended abdomen with ascites, bowel sounds are heard.   Extremities: No cyanosis, clubbing trace edema, peripheral pulses are palpable. Psych: Moaning in pain, anxious and irritated, frail-appearing. CNS: Moves all extremities. Skin: Warm and dry.  No rashes noted.  Data Reviewed:   CBC: Recent Labs  Lab 11/30/2022 1922 12/08/22 0651 12/08/22 1725  WBC 6.5 6.4 4.8  NEUTROABS 5.4 5.0  --   HGB 8.0* 8.0* 7.7*  HCT 24.3* 25.0* 23.9*  MCV 95.3 98.8 98.4  PLT 173 121* 134*    Basic Metabolic Panel: Recent Labs  Lab 11/29/2022 1922 12/08/22 0533 12/09/22 0350  NA 135 141 147*  K 3.7 4.2 3.2*  CL 90* 98 104  CO2 29 27 32  GLUCOSE 416* 305* 313*  BUN 84* 79* 74*  CREATININE 1.85* 1.65* 1.72*  CALCIUM 10.3 10.2 10.0  MG  --   --  2.2    Liver Function Tests: Recent Labs  Lab 11/19/2022 1922 12/08/22 0533  AST 26 43*  ALT 10 10  ALKPHOS 73 69  BILITOT 4.1* 4.1*  PROT 9.6* 9.1*  ALBUMIN 4.4 4.4     Radiology Studies: CT Head Wo Contrast  Result Date: 11/13/2022 CLINICAL DATA:  Mental status change, unknown cause EXAM: CT HEAD WITHOUT CONTRAST TECHNIQUE: Contiguous axial images were obtained from the base of the skull through the vertex without intravenous contrast. RADIATION DOSE REDUCTION: This exam was performed according to  the departmental dose-optimization program which includes automated exposure control, adjustment of the mA and/or kV according to patient size and/or use of iterative reconstruction technique. COMPARISON:  10/28/2022 FINDINGS: Technical note: Examination is degraded by patient motion artifact, particularly at the skull base and posterior fossa. Examination was repeated once with slight improvement in degree of artifact. Brain: No evidence of acute infarction, hemorrhage, hydrocephalus, extra-axial collection or mass lesion/mass effect. Scattered low-density changes within the periventricular and subcortical white matter compatible with chronic microvascular ischemic change. Mild diffuse cerebral volume loss. Vascular: Atherosclerotic calcifications involving the large vessels of the skull base. No unexpected hyperdense vessel. Skull: No evidence of acute calvarial fracture. Sinuses/Orbits: Right maxillary sinus mucosal thickening. Other: None. IMPRESSION: 1. No acute intracranial findings. 2. Chronic microvascular ischemic change and cerebral volume loss. 3. Right maxillary sinus disease. Electronically Signed   By: Davina Poke D.O.   On: 11/25/2022 18:58   DG Chest 1 View  Result Date: 12/12/2022 CLINICAL  DATA:  Altered mental status. EXAM: CHEST  1 VIEW COMPARISON:  November 26, 2022 FINDINGS: The heart size and mediastinal contours are within normal limits. Both lungs are clear. The visualized skeletal structures are unremarkable. IMPRESSION: No active disease. Electronically Signed   By: Dorise Bullion III M.D.   On: 12/10/2022 18:55      LOS: 2 days    Flora Lipps, MD [facility] Available via Epic secure chat 7am-7pm After these hours, please refer to coverage provider listed on [email or web address] 12/09/2022, 2:39 PM

## 2022-12-09 NOTE — Evaluation (Signed)
Clinical/Bedside Swallow Evaluation Patient Details  Name: [name] MRN: [id number] Date of Birth: 11-29-1961  Today's Date: 12/09/2022 Time: SLP Start Time (ACUTE ONLY): 25 SLP Stop Time (ACUTE ONLY): 7121 SLP Time Calculation (min) (ACUTE ONLY): 21 min  Past Medical History:  Past Medical History:  Diagnosis Date   Anemia    Arthritis    CAD (coronary artery disease)    Multivessel disease status post CABG 08/2015   Cirrhosis ([place])    CKD (chronic kidney disease) stage 3, GFR 30-59 ml/min (HCC)    Diastolic CHF ([place])    Essential hypertension    Hyperlipidemia    Iron deficiency anemia 07/15/2021   OSA on CPAP    Polyclonal gammopathy    Thrombocytopenia ([place]) 2016   Type 2 diabetes mellitus ([place])    Past Surgical History:  Past Surgical History:  Procedure Laterality Date   APPENDECTOMY     Biceps tendon surgery Right    BIOPSY  11/22/2019   Procedure: BIOPSY;  Surgeon: Daneil Dolin, MD;  [facility];  Service: Endoscopy;;   CARDIAC CATHETERIZATION N/A 08/25/2015   Procedure: Left Heart Cath and Coronary Angiography;  Surgeon: Belva Crome, MD;  Location: [place] [facility];  Service: Cardiovascular;  Laterality: N/A;   CATARACT EXTRACTION Left 08/18/2022   COLONOSCOPY WITH PROPOFOL N/A 11/22/2019   Procedure: COLONOSCOPY WITH PROPOFOL;  Surgeon: Daneil Dolin, MD; Four 4-5 mm polyps, findings suggestive of portal colopathy, congested appearing colonic mucosa diffusely, rectal varices, and adequate right colon prep.  Pathology with tubular adenomas and hyperplastic polyp.  Right colon biopsy with focal active colitis.  Recommendations to repeat colonoscopy in 3 months due to poor prep.   COLONOSCOPY WITH PROPOFOL N/A 03/17/2020   Procedure: COLONOSCOPY WITH PROPOFOL;  Surgeon: Daneil Dolin, MD;  [facility];  Service: Endoscopy;  Laterality: N/A;  8:45am - pt does not need covid test, was + 2/4 <90 days   CORONARY ARTERY BYPASS  GRAFT N/A 08/29/2015   Procedure: CORONARY ARTERY BYPASS GRAFTING (CABG);  Surgeon: [name], MD;  Location: [place];  Service: Open Heart Surgery;  Laterality: N/A;   ESOPHAGEAL BANDING N/A 07/23/2021   Procedure: ESOPHAGEAL BANDING;  Surgeon: Eloise Harman, DO;  [facility];  Service: Endoscopy;  Laterality: N/A;   ESOPHAGEAL BANDING N/A 11/25/2022   Procedure: ESOPHAGEAL BANDING;  Surgeon: Daneil Dolin, MD;  [facility];  Service: Endoscopy;  Laterality: N/A;   ESOPHAGOGASTRODUODENOSCOPY (EGD) WITH PROPOFOL N/A 11/22/2019   Procedure: ESOPHAGOGASTRODUODENOSCOPY (EGD) WITH PROPOFOL;  Surgeon: Daneil Dolin, MD; 4 columns of grade 2-3 esophageal varices, portal gastropathy, gastric polyp/abnormal gastric mucosa s/p biopsy.  Pathology with hyperplastic polyp, mild chronic gastritis, negative H. pylori.   ESOPHAGOGASTRODUODENOSCOPY (EGD) WITH PROPOFOL N/A 07/23/2021   Grade 2 esophageal varices without stigmata of bleeding, completely eradicated with banding, portal gastropathy, GAVE without bleeding.   ESOPHAGOGASTRODUODENOSCOPY (EGD) WITH PROPOFOL N/A 11/25/2022   Procedure: ESOPHAGOGASTRODUODENOSCOPY (EGD) WITH PROPOFOL;  Surgeon: Daneil Dolin, MD;  [facility];  Service: Endoscopy;  Laterality: N/A;   KNEE ARTHROSCOPY Left    TEE WITHOUT CARDIOVERSION N/A 08/29/2015   Procedure: TRANSESOPHAGEAL ECHOCARDIOGRAM (TEE);  Surgeon: [name], MD;  Location: [place];  Service: Open Heart Surgery;  Laterality: N/A;   TOTAL KNEE ARTHROPLASTY Left 03/09/2017   Procedure: TOTAL KNEE ARTHROPLASTY;  Surgeon: Carole Civil, MD;  Location: [facility];  Service: Orthopedics;  Laterality: Left;  HPI:  [name] is a 61 y.o. male with medical history significant of CAD, CKD, cirrhosis, diastolic heart failure,, hypertension, hyperlipidemia, iron deficiency anemia, type 2 diabetes mellitus presented to the ED [facility] due to  altered mental status.   Patient was reported to be hyperglycemic.  Of note patient was recently discharged on December 19 after admission for GI bleed secondary to varices.  He did have paracentesis while in the hospital at that time and was discharged to skilled nursing facility.       In the ED, patient had stable vitals.  pH was 7.5 with pCO2 of 40.  No leukocytosis.  Hemoglobin at 8.0.  Creatinine was slightly elevated.  Patient was hyperglycemic at 416.  Lactic acid was elevated at 3.8.  Patient tested positive for COVID.  CT head scan showed the chronic microvascular changes.  Chest x-ray was negative for acute findings.  Patient was then admitted hospital for further evaluation and treatment.  BSE requested.    Assessment / Plan / Recommendation  Clinical Impression  Limited clinical swallow evaluation completed due to poor Pt awareness and participation. Pt moaning throughout session. He was unable to follow commands and allowed oral care with mod/max cues. Pt accepted ice chip, but did not move bolus in his mouth and resulted in labial spillage. This was attempted several times and also with small presentation water with poor oral awarness from Pt. He is currently inappropriate for PO given AMS. Recommend NPO and SLP will check back tomorrow. Above to Pt's wife and Therapist, sports. SLP Visit Diagnosis: Dysphagia, unspecified (R13.10)    Aspiration Risk  Moderate aspiration risk;Risk for inadequate nutrition/hydration    Diet Recommendation NPO;Ice chips PRN after oral care   Medication Administration: Via alternative means    Other  Recommendations Oral Care Recommendations: Oral care QID;Oral care prior to ice chip/H20;Staff/trained caregiver to provide oral care    Recommendations for follow up therapy are one component of a multi-disciplinary discharge planning process, led by the attending physician.  Recommendations may be updated based on patient status, additional functional criteria and  insurance authorization.  Follow up Recommendations Skilled nursing-short term rehab (<3 hours/day)      Assistance Recommended at Discharge    Functional Status Assessment Patient has had a recent decline in their functional status and demonstrates the ability to make significant improvements in function in a reasonable and predictable amount of time.  Frequency and Duration min 2x/week  1 week       Prognosis Prognosis for Safe Diet Advancement: Guarded Barriers to Reach Goals:  (level of alertness)      Swallow Study   General Date of Onset: 12/04/2022 HPI: [name] is a 61 y.o. male with medical history significant of CAD, CKD, cirrhosis, diastolic heart failure,, hypertension, hyperlipidemia, iron deficiency anemia, type 2 diabetes mellitus presented to the ED [facility] due to altered mental status.   Patient was reported to be hyperglycemic.  Of note patient was recently discharged on December 19 after admission for GI bleed secondary to varices.  He did have paracentesis while in the hospital at that time and was discharged to skilled nursing facility.       In the ED, patient had stable vitals.  pH was 7.5 with pCO2 of 40.  No leukocytosis.  Hemoglobin at 8.0.  Creatinine was slightly elevated.  Patient was hyperglycemic at 416.  Lactic acid was elevated at 3.8.  Patient tested positive for COVID.  CT head  scan showed the chronic microvascular changes.  Chest x-ray was negative for acute findings.  Patient was then admitted hospital for further evaluation and treatment.  BSE requested. Type of Study: Bedside Swallow Evaluation Diet Prior to this Study: Regular;Thin liquids Temperature Spikes Noted: No Respiratory Status: Room air History of Recent Intubation: No Behavior/Cognition: Alert;Requires cueing Oral Cavity Assessment: Dry;Dried secretions Oral Care Completed by SLP: Yes Oral Cavity - Dentition: Poor condition Vision: Impaired for self-feeding Self-Feeding  Abilities: Total assist Patient Positioning: Upright in bed Baseline Vocal Quality: Not observed Volitional Cough: Cognitively unable to elicit Volitional Swallow: Unable to elicit    Oral/Motor/Sensory Function Overall Oral Motor/Sensory Function: Generalized oral weakness   Ice Chips Ice chips: Impaired Presentation: Spoon Oral Phase Impairments: Reduced labial seal;Reduced lingual movement/coordination;Poor awareness of bolus Oral Phase Functional Implications: Oral residue;Prolonged oral transit Pharyngeal Phase Impairments: Unable to trigger swallow   Thin Liquid Thin Liquid: Impaired Presentation: Spoon Oral Phase Impairments: Reduced labial seal;Poor awareness of bolus;Reduced lingual movement/coordination Oral Phase Functional Implications: Right anterior spillage Pharyngeal  Phase Impairments: Unable to trigger swallow    Nectar Thick Nectar Thick Liquid: Not tested   Honey Thick Honey Thick Liquid: Not tested   Puree Puree: Not tested   Solid     Solid: Not tested     Thank you,  Genene Churn, [place]  [name] 12/09/2022,5:51 PM

## 2022-12-09 NOTE — TOC Progression Note (Signed)
Transition of Care [facility]) - Progression Note    Patient Details  Name: [name] MRN: [id number] Date of Birth: 1961/09/06  [facility] of [facility] CM/SW Contact  Ihor Gully, LCSW Phone Number: 12/09/2022, 4:55 PM  Clinical Narrative:    Per Marchia Meiers with hospice, patient will have to wait 5 days, test negative and be asymptomatic before Hospice can do a virtual bed or admit to [place]. Attending notified.    Expected Discharge Plan: [place] Barriers to Discharge: Continued Medical Work up  Expected Discharge Plan and Services                                               Social Determinants of Health (SDOH) Interventions SDOH Screenings   Food Insecurity: No Food Insecurity (12/08/2022)  Housing: Low Risk  (12/08/2022)  Transportation Needs: No Transportation Needs (12/08/2022)  Utilities: Not At Risk (12/08/2022)  Depression (PHQ2-9): Low Risk  (07/24/2020)  Tobacco Use: Low Risk  (11/13/2022)    Readmission Risk Interventions    11/22/2022    8:40 AM 08/25/2021    2:25 PM 07/22/2021   11:48 AM  Readmission Risk Prevention Plan  Transportation Screening Complete Complete Complete  HRI or [place]   Complete  Social Work Consult for [place] Planning/Counseling   Complete  Palliative Care Screening   Not Applicable  Medication Review Press photographer) Complete Complete Complete  HRI or Home Care Consult Complete Complete   SW Recovery Care/Counseling Consult Complete Complete   Palliative Care Screening Not Applicable Not Applicable   Skilled Nursing Facility Not Applicable Complete

## 2022-12-09 NOTE — Progress Notes (Signed)
Pt responds to voice, but speech is incomprehensible with only occasional moans and groans. PRN morphine given per PAINAD score. Wife attempted to give pt water multiple times and I educated on risk of aspiration. I did not feel comfortable giving pt anything PO. SLP order placed in chart. Vitals stable.

## 2022-12-09 NOTE — Inpatient Diabetes Management (Signed)
Inpatient Diabetes Program Recommendations  AACE/ADA: New Consensus Statement on Inpatient Glycemic Control (2015)  Target Ranges:  Prepandial:   less than 140 mg/dL      Peak postprandial:   less than 180 mg/dL (1-2 hours)      Critically ill patients:  140 - 180 mg/dL   Lab Results  Component Value Date   GLUCAP 326 (H) 12/09/2022   HGBA1C 5.6 10/21/2022    Review of Glycemic Control  Latest Reference Range & Units 12/08/22 09:08 12/08/22 11:28 12/08/22 13:00 12/08/22 16:09 12/08/22 18:15 12/08/22 20:48 12/08/22 21:23 12/09/22 07:18  Glucose-Capillary 70 - 99 mg/dL 290 (H) Novolog 8 units 261 (H) Novolog 8 units  254 (H) Novolog 8 units 257 (H) Novolog 3 units & Levemir 25 units 326 (H)  (H): Data is abnormally high  Diabetes history: DM2 Outpatient Diabetes medications: Tresiba 52 units, Novolog 5 units TID Current orders for Inpatient glycemic control: Novolog 0-15 units TID and 0-5 units QHS, Levemir 25 units QD  Inpatient Diabetes Program Recommendations:    Levemir 20 units BID  Will continue to follow while inpatient.  Thank you, Reche Dixon, MSN, [place] Diabetes Coordinator Inpatient Diabetes Program [phone] (team pager from 8a-5p)

## 2022-12-09 NOTE — Progress Notes (Signed)
Patient placed on CPAP for the night. Patient was very combative while RT was trying to get him on mask. Let RN know to keep an eye on him and call us if needed.

## 2022-12-09 NOTE — Progress Notes (Signed)
Patients wife at bedside concerned regarding patients labs and status, requested to speak with MD. MD Pokhrel made aware.

## 2022-12-10 DIAGNOSIS — Z515 Encounter for palliative care: Secondary | ICD-10-CM | POA: Diagnosis not present

## 2022-12-10 DIAGNOSIS — E860 Dehydration: Secondary | ICD-10-CM | POA: Diagnosis not present

## 2022-12-10 DIAGNOSIS — U071 COVID-19: Secondary | ICD-10-CM | POA: Diagnosis not present

## 2022-12-10 DIAGNOSIS — Z7189 Other specified counseling: Secondary | ICD-10-CM | POA: Diagnosis not present

## 2022-12-10 DIAGNOSIS — I2510 Atherosclerotic heart disease of native coronary artery without angina pectoris: Secondary | ICD-10-CM | POA: Diagnosis not present

## 2022-12-10 DIAGNOSIS — G9341 Metabolic encephalopathy: Secondary | ICD-10-CM | POA: Diagnosis not present

## 2022-12-10 LAB — CBC
HCT: 24.7 % — ABNORMAL LOW (ref 39.0–52.0)
Hemoglobin: 7.8 g/dL — ABNORMAL LOW (ref 13.0–17.0)
MCH: 32.1 pg (ref 26.0–34.0)
MCHC: 31.6 g/dL (ref 30.0–36.0)
MCV: 101.6 fL — ABNORMAL HIGH (ref 80.0–100.0)
Platelets: 114 10*3/uL — ABNORMAL LOW (ref 150–400)
RBC: 2.43 MIL/uL — ABNORMAL LOW (ref 4.22–5.81)
RDW: 17.1 % — ABNORMAL HIGH (ref 11.5–15.5)
WBC: 9.3 10*3/uL (ref 4.0–10.5)
nRBC: 0.2 % (ref 0.0–0.2)

## 2022-12-10 LAB — GLUCOSE, CAPILLARY
Glucose-Capillary: 305 mg/dL — ABNORMAL HIGH (ref 70–99)
Glucose-Capillary: 302 mg/dL — ABNORMAL HIGH (ref 70–99)
Glucose-Capillary: 290 mg/dL — ABNORMAL HIGH (ref 70–99)

## 2022-12-10 LAB — BASIC METABOLIC PANEL
Anion gap: 12 (ref 5–15)
BUN: 57 mg/dL — ABNORMAL HIGH (ref 8–23)
CO2: 27 mmol/L (ref 22–32)
Calcium: 9.9 mg/dL (ref 8.9–10.3)
Chloride: 112 mmol/L — ABNORMAL HIGH (ref 98–111)
Creatinine, Ser: 1.47 mg/dL — ABNORMAL HIGH (ref 0.61–1.24)
GFR, Estimated: 53 mL/min — ABNORMAL LOW (ref >60)
Glucose, Bld: 346 mg/dL — ABNORMAL HIGH (ref 70–99)
Potassium: 3 mmol/L — ABNORMAL LOW (ref 3.5–5.1)
Sodium: 151 mmol/L — ABNORMAL HIGH (ref 135–145)

## 2022-12-10 LAB — MAGNESIUM: Magnesium: 2.2 mg/dL (ref 1.7–2.4)

## 2022-12-10 MED ORDER — ACETAMINOPHEN 650 MG RE SUPP
650.0000 mg | Freq: Every day | RECTAL | Status: DC | PRN
  Administered 2022-12-14: 650 mg via RECTAL
  Filled 2022-12-10 (×2): qty 1

## 2022-12-10 MED ORDER — POLYVINYL ALCOHOL 1.4 % OP SOLN: 1.0000 [drp] | Freq: Four times a day (QID) | OPHTHALMIC | Status: DC | PRN

## 2022-12-10 MED ORDER — LORAZEPAM 2 MG/ML IJ SOLN
0.5000 mg | INTRAMUSCULAR | Status: DC | PRN
  Administered 2022-12-11 (×3): 1 mg via INTRAVENOUS
  Filled 2022-12-10 (×3): qty 1

## 2022-12-10 MED ORDER — HALOPERIDOL LACTATE 5 MG/ML IJ SOLN
2.0000 mg | Freq: Four times a day (QID) | INTRAMUSCULAR | Status: DC | PRN
  Administered 2022-12-11: 2 mg via INTRAVENOUS
  Filled 2022-12-10: qty 1

## 2022-12-10 MED ORDER — KETOROLAC TROMETHAMINE 30 MG/ML IJ SOLN
30.0000 mg | Freq: Four times a day (QID) | INTRAMUSCULAR | Status: DC | PRN
  Administered 2022-12-10 – 2022-12-13 (×4): 30 mg via INTRAVENOUS
  Filled 2022-12-10 (×4): qty 1

## 2022-12-10 MED ORDER — BIOTENE DRY MOUTH MT LIQD: 15.0000 mL | OROMUCOSAL | Status: DC | PRN

## 2022-12-10 MED ORDER — GLYCOPYRROLATE 0.2 MG/ML IJ SOLN
0.4000 mg | INTRAMUSCULAR | Status: DC | PRN
  Administered 2022-12-11 (×3): 0.4 mg via INTRAVENOUS
  Filled 2022-12-10 (×3): qty 2

## 2022-12-10 MED ORDER — POTASSIUM CL IN DEXTROSE 5% 20 MEQ/L IV SOLN
20.0000 meq | INTRAVENOUS | Status: DC
  Administered 2022-12-10 – 2022-12-14 (×6): 20 meq via INTRAVENOUS

## 2022-12-10 NOTE — Progress Notes (Signed)
Speech Language Pathology Treatment: Dysphagia  Patient Details Name: [name] MRN: [id number] DOB: [birth year] Today's Date: 12/10/2022 Time: 0109-3235 SLP Time Calculation (min) (ACUTE ONLY): 21 min  Assessment / Plan / Recommendation Clinical Impression  Ongoing diagnostic dysphagia therapy provided. Pt was lethargic throughout treatment; SLP repositioned Pt to sitting upright with Pt's wife at bedside very kindly speaking to Patient throughout treatment. SLP provided oral care; Pt was resistant to oral care but did allow SLP to cleanse oral cavity with toothette. Pt only opened his eyes X1 for a brief moment and kept them closed for the remainder of the session. SLP placed a single ice chip in oral cavity, however it was eventually removed; note no oral manipulation of bolus. SLP provided tsp sip of cold water which Pt appropriately opened his mouth and took the tsp into oral cavity, however all liquid spilled out of oral cavity and no oral response and no swallowing was noted. Pt continues to be inappropriate for PO. Above to wife and LPN. ST will continue efforts; Pt's wife seemed concerned regarding NPO status asking "will you come back later today to check on him?" and SLP provided education that Pt is VERY high risk for aspiration in his current state; SLP reinforced that nothing should be provided to Pt while he is not alert or responsive. ST will continue efforts,   HPI HPI: [name] is a 61 y.o. male with medical history significant of CAD, CKD, cirrhosis, diastolic heart failure,, hypertension, hyperlipidemia, iron deficiency anemia, type 2 diabetes mellitus presented to the ED [facility] due to altered mental status.   Patient was reported to be hyperglycemic.  Of note patient was recently discharged on December 19 after admission for GI bleed secondary to varices.  He did have paracentesis while in the hospital at that time and was discharged to skilled nursing  facility.       In the ED, patient had stable vitals.  pH was 7.5 with pCO2 of 40.  No leukocytosis.  Hemoglobin at 8.0.  Creatinine was slightly elevated.  Patient was hyperglycemic at 416.  Lactic acid was elevated at 3.8.  Patient tested positive for COVID.  CT head scan showed the chronic microvascular changes.  Chest x-ray was negative for acute findings.  Patient was then admitted hospital for further evaluation and treatment.  BSE requested.      SLP Plan  Continue with current plan of care      Recommendations for follow up therapy are one component of a multi-disciplinary discharge planning process, led by the attending physician.  Recommendations may be updated based on patient status, additional functional criteria and insurance authorization.    Recommendations  Diet recommendations: NPO Medication Administration: Via alternative means                Oral Care Recommendations: Oral care QID;Oral care prior to ice chip/H20;Staff/trained caregiver to provide oral care Follow Up Recommendations: Skilled nursing-short term rehab (<3 hours/day) SLP Visit Diagnosis: Dysphagia, unspecified (R13.10) Plan: Continue with current plan of care         Derris Millan H. Roddie Mc, CCC-SLP Speech Language Pathologist    Wende Bushy  12/10/2022, 2:04 PM

## 2022-12-10 NOTE — Inpatient Diabetes Management (Addendum)
Inpatient Diabetes Program Recommendations  AACE/ADA: New Consensus Statement on Inpatient Glycemic Control (2015)  Target Ranges:  Prepandial:   less than 140 mg/dL      Peak postprandial:   less than 180 mg/dL (1-2 hours)      Critically ill patients:  140 - 180 mg/dL   Lab Results  Component Value Date   GLUCAP 305 (H) 12/10/2022   HGBA1C 5.6 10/21/2022    Review of Glycemic Control  Latest Reference Range & Units 12/09/22 07:18 12/09/22 11:22 12/09/22 16:53 12/09/22 20:14 12/10/22 07:14  Glucose-Capillary 70 - 99 mg/dL 326 (H) 320 (H) 228 (H) 202 (H) 305 (H)  (H): Data is abnormally high Diabetes history: DM2 Outpatient Diabetes medications: Tresiba 52 units, Novolog 5 units TID Current orders for Inpatient glycemic control: Novolog 0-15 units TID and 0-5 units QHS, Levemir 20 units BID   Inpatient Diabetes Program Recommendations:     Consider increasing Levemir 28 units BID   Thanks, Bronson Curb, MSN, RNC-OB Diabetes Coordinator [phone] (8a-5p)

## 2022-12-10 NOTE — Progress Notes (Signed)
PROGRESS NOTE    [name]  YWV:[id number] DOB: 1961-01-07 DOA: 11/27/2022 PCP: [name], MD    Brief Narrative:   [name] is a 61 y.o. male with medical history significant of CAD, CKD, cirrhosis, diastolic heart failure,, hypertension, hyperlipidemia, iron deficiency anemia, type 2 diabetes mellitus presented to the ED [facility] due to altered mental status.   Patient was reported to be hyperglycemic.  Of note patient was recently discharged on December 19 after admission for GI bleed secondary to varices.  He did have paracentesis while in the hospital at that time and was discharged to skilled nursing facility.     In the ED, patient had stable vitals. ABG showed  pH was 7.5 with pCO2 of 40.  No leukocytosis..  Hemoglobin at 8.0.  Creatinine was slightly elevated.  Patient was hyperglycemic at 416.  Lactic acid was elevated at 3.8.  Patient tested positive for COVID.  CT head scan showed the chronic microvascular changes.  Chest x-ray was negative for acute findings.  Patient was then admitted hospital for further evaluation and treatment.   Assessment and Plan:  Acute metabolic encephalopathy  Likely multifactorial from COVID, dehydration, hepatic encephalopathy.  CT head with microvascular changes.  Ammonia was slightly elevated so  lactulose was ordered. On D5 drip since the patient is NPO.  Hyperammonemia/ history of cirrhosis of liver. Ammonia 63 on presentation. On  lactulose, aim for 2-3 bowel movements a day.  Hypokalemia.  Continue IV potassium with fluids.  On D5 with KCl.   Volume depletion/ dehydration/hypernatremia. Received IV fluid bolus in the ED.  Lactate was initially at 3.8 with improvement.  Continue D5  since patient is encephalopathic NPO.   COVID-19 virus infection - COVID-positive.  Chest x-ray without any infiltrate.  No leukocytosis.  Not requiring supplemental oxygen.  Patient does have liver disease so no Paxlovid at this  time.   Pressure injury of skin No pressure ulcers noted over the sacrum.     Diabetes mellitus type 2 in nonobese (HCC) - 52 units of Tresiba at home.  Continue long-acting sliding scale insulin while in the hospital.  Latest POC glucose of 302.  Patient is currently on D5 water as well.   NASH cirrhosis of liver ([place]) With multiple paracentesis in the past for ascites and recent admission for variceal bleeding.  Hemoglobin on discharge on last admission was 8.9.  Hemoglobin today at 7.7 from hemodilution.   Atherosclerotic heart disease of native coronary artery without angina pectoris on aspirin, nadolol, pravastatin, Aldactone, monitoring telemetry.   Hyperlipidemia - Continue pravastatin  Acute kidney injury.  Creatinine at 1.7.  Secondary to poor oral intake, liver disease.  Creatinine of 1.6 yesterday.  Goals of care. Had a long discussion with multiple family members yesterday.  Plan is hospice referral at this time.  Critical care has been consulted.   DVT prophylaxis: heparin injection 5,000 Units Start: 12/08/22 0600 SCDs Start: 12/08/22 0031   Code Status:     Code Status: DNR  Disposition: Plan for hospice, TOC has been consulted.  Status is: Inpatient Remains inpatient appropriate because: Metabolic encephalopathy, IV fluids, multiple comorbidities, potential hospice placement   Family Communication: Spoke with the patient and multiple family members at bedside at length on 12/09/2022 . Consultants:  Palliative care  Procedures:  None  Antimicrobials:  None  Anti-infectives (From admission, onward)    None       Subjective: Today, patient was seen and examined at bedside.  Patient has not improved much with moaning and refusing any intervention.  No meaningful interaction. Objective: Vitals:   12/09/22 2300 12/09/22 2312 12/10/22 0333 12/10/22 1300  BP:  (!) 146/87 (!) 111/59 114/66  Pulse: (!) 103 (!) 103 91 94  Resp: 16 19 20 20   Temp:  97.9  F (36.6 C) 98.6 F (37 C) 100.1 F (37.8 C)  TempSrc:  Oral  Axillary  SpO2: 95% 97% 97% 94%  Weight:      Height:        Intake/Output Summary (Last 24 hours) at 12/10/2022 1449 Last data filed at 12/10/2022 1100 Gross per 24 hour  Intake 2407.07 ml  Output 2200 ml  Net 207.07 ml    Filed Weights   12/08/22 0803 12/08/22 0814  Weight: 94.2 kg 94.2 kg    Physical Examination: Body mass index is 29.38 kg/m.  General: Patient is weak and deconditioned frail and moaning HENT:   No scleral pallor or icterus noted. Oral mucosa is dry mucosa Chest: Diminished breath sounds noted. CVS: S1 &S2 heard. No murmur.  Regular rate and rhythm. Abdomen: Soft, distended abdomen with ascites, bowel sounds are heard.   Extremities: No cyanosis, clubbing trace edema, peripheral pulses are palpable. Psych: Moaning in pain, anxious and irritated, frail-appearing. CNS: Moves all extremities. Skin: Warm and dry.  No rashes noted.  Data Reviewed:   CBC: Recent Labs  Lab 12/03/2022 1922 12/08/22 0651 12/08/22 1725 12/09/22 1622  WBC 6.5 6.4 4.8 5.2  NEUTROABS 5.4 5.0  --   --   HGB 8.0* 8.0* 7.7* 7.7*  HCT 24.3* 25.0* 23.9* 24.8*  MCV 95.3 98.8 98.4 101.2*  PLT 173 121* 134* 128*     Basic Metabolic Panel: Recent Labs  Lab 11/16/2022 1922 12/08/22 0533 12/09/22 0350 12/10/22 0551  NA 135 141 147* 151*  K 3.7 4.2 3.2* 3.0*  CL 90* 98 104 112*  CO2 29 27 32 27  GLUCOSE 416* 305* 313* 346*  BUN 84* 79* 74* 57*  CREATININE 1.85* 1.65* 1.72* 1.47*  CALCIUM 10.3 10.2 10.0 9.9  MG  --   --  2.2 2.2     Liver Function Tests: Recent Labs  Lab 12/11/2022 1922 12/08/22 0533  AST 26 43*  ALT 10 10  ALKPHOS 73 69  BILITOT 4.1* 4.1*  PROT 9.6* 9.1*  ALBUMIN 4.4 4.4      Radiology Studies: No results found.    LOS: 3 days    Flora Lipps, MD [facility] Available via Epic secure chat 7am-7pm After these hours, please refer to coverage provider listed on  [email or web address] 12/10/2022, 2:49 PM

## 2022-12-10 NOTE — Consult Note (Signed)
Consultation Note Date: 12/10/2022   Patient Name: [name]  DOB: 30-Jul-1961  MRN: [id number]  Age / Sex: 61 y.o., male  PCP: Christopher Sites, MD Referring Physician: Flora Lipps, MD  Reason for Consultation: Establishing goals of care  HPI/Patient Profile: 61 y.o. male  with past medical history of CAD, CKD, cirrhosis, HCpEF, HTN, HLD, iron deficiency anemia, T2DM, recent GIB admitted on 12/08/2022 with COVID infection, hyperammonemia, dehydration.   Clinical Assessment and Goals of Care: Chart reviewed. Discussed with Dr. Louanne [name]. I met at [facility] bedside and had conversation with wife [name]. I spoke extensively with wife [name] outside room. She had [name]'s brother, [name], on the phone. I fully explained [name]'s poor prognosis and expectations. We discussed anticipated signs and symptoms at end of life. We discussed that we do not anticipate that he will be able to eat/drink but know he does not feel hunger or thirst but we will make sure we keep his mouth moist and not dry. We discussed the importance of shifting our focus on [name] and his symptoms in front of [place] rather than the numbers of vital signs and labs that we treat to make [place] feel better but they will not make [name] feel better. [name] agrees with stopping lab and CBG sticks. We discussed minimizing medication to IV medication to ensure his comfort. We also discussed the harm of IV fluids causing overload and edema as the body shuts down and agree to stop fluids tomorrow. He will transition to hospice facility as able. [name] understands that we cannot fix him and make him better this time and she knows that he has been suffering. [name] reports that [name] told her recently that he was "tired of being sick." We agree that we feel he is at peace. I encouraged [name] to rely on her faith and her family to get her through. We agree that [name] is in the hands  of the Lord at this time.   [name] had many good questions but good insight that [name] is at end of life and she wants to shift her focus of care for him. She reports that her sister has been trying to prepare her but she has not been ready to hear. She acknowledges that she is sometimes forgetful but she took many good notes during our conversation. All questions/concerns addressed. Emotional support provided.   Primary Decision Maker NEXT OF KIN wife [name]    SUMMARY OF RECOMMENDATIONS   - DNR - Full comfort care - Orders adjusted to reflect comfort per above discussion - Plans to stop fluids 12/11/22  Code Status/Advance Care Planning: DNR   Symptom Management:  PRN comfort medication added.   Prognosis:  Likely days.   Discharge Planning: Hospice facility vs hospital death.       Primary Diagnoses: Present on Admission:  Acute metabolic encephalopathy  Hyperlipidemia  Atherosclerotic heart disease of native coronary artery without angina pectoris  NASH cirrhosis of liver (HCC)  Pressure injury of skin   I have reviewed the medical  record, interviewed the patient and family, and examined the patient. The following aspects are pertinent.  Past Medical History:  Diagnosis Date   Anemia    Arthritis    CAD (coronary artery disease)    Multivessel disease status post CABG 08/2015   Cirrhosis (HCC)    CKD (chronic kidney disease) stage 3, GFR 30-59 ml/min (HCC)    Diastolic CHF (HCC)    Essential hypertension    Hyperlipidemia    Iron deficiency anemia 07/15/2021   OSA on CPAP    Polyclonal gammopathy    Thrombocytopenia (HCC) 2016   Type 2 diabetes mellitus ([place])    Social History   Socioeconomic History   Marital status: Married    Spouse name: Not on file   Number of children: 0   Years of education: college   Highest education level: Not on file  Occupational History   Occupation: Maintenance tech    Employer: [employer]  Tobacco Use   Smoking  status: Never   Smokeless tobacco: Never  Vaping Use   Vaping Use: Never used  Substance and Sexual Activity   Alcohol use: Not Currently   Drug use: No   Sexual activity: Not Currently  Other Topics Concern   Not on file  Social History Narrative   Not on file   Social Determinants of Health   Financial Resource Strain: Not on file  Food Insecurity: No Food Insecurity (12/08/2022)   Hunger Vital Sign    Worried About Running Out of Food in the Last Year: Never true    Ran Out of Food in the Last Year: Never true  Transportation Needs: No Transportation Needs (12/08/2022)   PRAPARE - Hydrologist (Medical): No    Lack of Transportation (Non-Medical): No  Physical Activity: Not on file  Stress: Not on file  Social Connections: Not on file   Family History  Problem Relation Age of Onset   Arthritis Other    Cancer Other    Diabetes Other    CAD Father    Diabetes Mellitus II Father    Liver cancer Father    Hodgkin's lymphoma Father    CAD Brother    Diabetes Mellitus II Brother    ALS Mother    Diabetes Mellitus II Sister    Diabetes Mellitus II Brother    Diabetes Mellitus II Maternal Grandmother    Aneurysm Maternal Grandmother    Cancer Maternal Grandfather    Anesthesia problems Neg Hx    Hypotension Neg Hx    Malignant hyperthermia Neg Hx    Pseudochol deficiency Neg Hx    Colon cancer Neg Hx    Scheduled Meds:  ALPRAZolam  0.25 mg Oral Daily   aspirin EC  81 mg Oral QPM   heparin  5,000 Units Subcutaneous Q8H   insulin aspart  0-15 Units Subcutaneous TID WC   insulin aspart  0-5 Units Subcutaneous QHS   insulin detemir  20 Units Subcutaneous BID   metolazone  2.5 mg Oral Weekly   midodrine  10 mg Oral TID WC   nadolol  20 mg Oral Daily   potassium chloride  40 mEq Oral Once   pravastatin  20 mg Oral q1800   spironolactone  100 mg Oral Daily   venlafaxine XR  75 mg Oral Q breakfast   Continuous Infusions:  dextrose 5 %  with KCl 20 mEq / L     PRN Meds:.acetaminophen, morphine injection, ondansetron **OR**  ondansetron (ZOFRAN) IV, oxyCODONE Allergies  Allergen Reactions   Fluticasone Rash   Review of Systems  Unable to perform ROS: Acuity of condition    Physical Exam Vitals and nursing note reviewed.  Constitutional:      Appearance: He is ill-appearing.     Comments: Frail   Cardiovascular:     Rate and Rhythm: Normal rate.  Pulmonary:     Effort: No tachypnea, accessory muscle usage or respiratory distress.  Abdominal:     General: There is distension.  Neurological:     Mental Status: He is confused.     Comments: Moaning; not following commands     Vital Signs: BP 114/66 (BP Location: Left Arm)   Pulse 94   Temp 100.1 F (37.8 C) (Axillary)   Resp 20   Ht 5' 10.5" (1.791 m)   Wt 94.2 kg   SpO2 94%   BMI 29.38 kg/m  Pain Scale: PAINAD   Pain Score: Asleep   SpO2: SpO2: 94 % O2 Device:SpO2: 94 % O2 Flow Rate: .O2 Flow Rate (L/min): 2 L/min  IO: Intake/output summary:  Intake/Output Summary (Last 24 hours) at 12/10/2022 1454 Last data filed at 12/10/2022 1100 Gross per 24 hour  Intake 2407.07 ml  Output 2200 ml  Net 207.07 ml    LBM: Last BM Date : 12/09/22 Baseline Weight: Weight: 94.2 kg Most recent weight: Weight: 94.2 kg     Palliative Assessment/Data:     Time In: 1445  Time Total: 80 min Greater than 50%  of this time was spent counseling and coordinating care related to the above assessment and plan.  Signed by: Vinie Sill, NP Palliative Medicine Team Pager # [phone] (M-F 8a-5p) Team Phone # [phone] (Nights/Weekends)

## 2022-12-10 NOTE — Progress Notes (Signed)
Pt's wife was informed today about discontinuing fluids tomorrow and has just stated "I think I was too quick to make the decision." She is wanting to talk to an MD or the palliative care nurse about continuing fluids for "another day or two." Education reinforced to pt's wife on his quality of life, however she is adamant about speaking to someone in the a.m.

## 2022-12-10 NOTE — Progress Notes (Signed)
Patient comfort care, continues on IV fluids Dextrose 5% with 20 Kcl. Patients blood glucose checked discontinued. MD Pokhrel and palliative NP Vinie Sill informed about patients fluids being continuous. No new orders.

## 2022-12-11 DIAGNOSIS — G9341 Metabolic encephalopathy: Secondary | ICD-10-CM | POA: Diagnosis not present

## 2022-12-11 DIAGNOSIS — I2510 Atherosclerotic heart disease of native coronary artery without angina pectoris: Secondary | ICD-10-CM | POA: Diagnosis not present

## 2022-12-11 DIAGNOSIS — E119 Type 2 diabetes mellitus without complications: Secondary | ICD-10-CM | POA: Diagnosis not present

## 2022-12-11 DIAGNOSIS — U071 COVID-19: Secondary | ICD-10-CM | POA: Diagnosis not present

## 2022-12-11 NOTE — Progress Notes (Signed)
PROGRESS NOTE    [name]  NOM:[id number] DOB: Oct 10, 1961 DOA: 12/06/2022 PCP: [name], MD    Brief Narrative:   [name] is a 61 y.o. male with medical history significant of CAD, CKD, cirrhosis, diastolic heart failure,, hypertension, hyperlipidemia, iron deficiency anemia, type 2 diabetes mellitus presented to the ED [facility] due to altered mental status.   Patient was reported to be hyperglycemic.  Of note patient was recently discharged on December 19 after admission for GI bleed secondary to varices.  He did have paracentesis while in the hospital at that time and was discharged to skilled nursing facility.  In the ED, patient had stable vitals. ABG showed  pH was 7.5 with pCO2 of 40.  No leukocytosis..  Hemoglobin at 8.0.  Creatinine was slightly elevated.  Patient was hyperglycemic at 416.  Lactic acid was elevated at 3.8.  Patient tested positive for COVID.  CT head scan showed the chronic microvascular changes.  Chest x-ray was negative for acute findings.  Patient was then admitted hospital for further evaluation and treatment.   Assessment and Plan:  Acute metabolic encephalopathy  Likely multifactorial from COVID, dehydration, hepatic encephalopathy.  CT head with microvascular changes.  Ammonia was slightly elevated. D5 drip since the patient is NPO.  No improvement noted  Hyperammonemia/ history of cirrhosis of liver. Ammonia 63 on presentation.  Currently on supportive care.  Hypokalemia.  On D5 with KCl.  No plans for further blood work.   Volume depletion/ dehydration/hypernatremia. Received IV fluid bolus in the ED.  Lactate was initially at 3.8 with improvement.  On D5 water.  COVID-19 virus infection - COVID-positive.  Chest x-ray without any infiltrate.  No leukocytosis.  Not requiring supplemental oxygen.     Diabetes mellitus type 2 in nonobese [facility]) On D5 water as per family request for hydration.[name]   NASH cirrhosis of liver  ([place]) With multiple paracentesis in the past for ascites and recent admission for variceal bleeding.     Atherosclerotic heart disease of native coronary artery without angina pectoris Patient was on aspirin, nadolol, pravastatin, Aldactone.  Focus on symptomatic management at this time.   Hyperlipidemia Patient was on pravastatin.  Acute kidney injury.  On presentation.  Latest creatinine was 1.4.  No plan for further blood work.  Goals of care. Palliative care on board and plan is no escalation of care.  Discussion about hospice on discharge.  Currently focusing on comfort medications/symptomatic care.   DVT prophylaxis:  None for comfort  Code Status:     Code Status: DNR  Disposition: Plan for hospice, TOC and palliative care on board.  Status is: Inpatient Remains inpatient appropriate because: Metabolic encephalopathy, IV fluids, multiple comorbidities, hospice-palliative care status   Family Communication:  Spoke with the patient and multiple family members at bedside at length on 12/09/2022 . Consultants:  Palliative care  Procedures:  None  Antimicrobials:  None  Anti-infectives (From admission, onward)    None       Subjective: Today, patient was seen and examined at bedside.  No improvement in his mentation.  No meaningful interaction.  Trying to avoid any physical stimuli or interaction.   Objective: Vitals:   12/10/22 0333 12/10/22 1300 12/10/22 1533 12/10/22 2101  BP: (!) 111/59 114/66  112/72  Pulse: 91 94  (!) 105  Resp: 20 20  20   Temp: 98.6 F (37 C) 100.1 F (37.8 C) 99.8 F (37.7 C) 99 F (37.2 C)  TempSrc:  Axillary Axillary   SpO2: 97% 94%  (!) 89%  Weight:      Height:        Intake/Output Summary (Last 24 hours) at 12/11/2022 1215 Last data filed at 12/11/2022 1049 Gross per 24 hour  Intake 2873.73 ml  Output 3 ml  Net 2870.73 ml    Filed Weights   12/08/22 0803 12/08/22 0814  Weight: 94.2 kg 94.2 kg    Physical  Examination: Body mass index is 29.38 kg/m.  General: Patient is weak, frail appearing, moaning and deconditioned without meaningful interaction. HENT:   No scleral pallor or icterus noted.  Dry mucosa. Chest: Diminished breath sounds bilaterally CVS: S1 &S2 heard. No murmur.  Regular rate and rhythm. Abdomen: Soft, stented abdomen with ascites present, bowel sounds are heard.   Extremities: No cyanosis, clubbing trace edema noted bilaterally,  peripheral pulses are palpable. Psych: Frail anxious and debilitated. CNS: Moves all extremities. Skin: Warm and dry.  No rashes noted.  Data Reviewed:   CBC: Recent Labs  Lab 11/30/2022 1922 12/08/22 0651 12/08/22 1725 12/09/22 1622 12/10/22 1510  WBC 6.5 6.4 4.8 5.2 9.3  NEUTROABS 5.4 5.0  --   --   --   HGB 8.0* 8.0* 7.7* 7.7* 7.8*  HCT 24.3* 25.0* 23.9* 24.8* 24.7*  MCV 95.3 98.8 98.4 101.2* 101.6*  PLT 173 121* 134* 128* 114*     Basic Metabolic Panel: Recent Labs  Lab 12/04/2022 1922 12/08/22 0533 12/09/22 0350 12/10/22 0551  NA 135 141 147* 151*  K 3.7 4.2 3.2* 3.0*  CL 90* 98 104 112*  CO2 29 27 32 27  GLUCOSE 416* 305* 313* 346*  BUN 84* 79* 74* 57*  CREATININE 1.85* 1.65* 1.72* 1.47*  CALCIUM 10.3 10.2 10.0 9.9  MG  --   --  2.2 2.2     Liver Function Tests: Recent Labs  Lab 11/19/2022 1922 12/08/22 0533  AST 26 43*  ALT 10 10  ALKPHOS 73 69  BILITOT 4.1* 4.1*  PROT 9.6* 9.1*  ALBUMIN 4.4 4.4      Radiology Studies: No results found.    LOS: 4 days    Flora Lipps, MD [facility] Available via Epic secure chat 7am-7pm After these hours, please refer to coverage provider listed on [email or web address] 12/11/2022, 12:15 PM

## 2022-12-11 NOTE — Progress Notes (Signed)
Patient with wife at bedside, he responds to voice and pain. PRN meds given with intentions of helping patient relax. Fluids continued. Patients wife seems hopeful that things will improve regardless of teaching.

## 2022-12-12 DIAGNOSIS — E119 Type 2 diabetes mellitus without complications: Secondary | ICD-10-CM | POA: Diagnosis not present

## 2022-12-12 DIAGNOSIS — I2510 Atherosclerotic heart disease of native coronary artery without angina pectoris: Secondary | ICD-10-CM | POA: Diagnosis not present

## 2022-12-12 DIAGNOSIS — G9341 Metabolic encephalopathy: Secondary | ICD-10-CM | POA: Diagnosis not present

## 2022-12-12 DIAGNOSIS — U071 COVID-19: Secondary | ICD-10-CM | POA: Diagnosis not present

## 2022-12-12 NOTE — Progress Notes (Signed)
PROGRESS NOTE    [name]  NFA:[id number] DOB: [birth year] DOA: 12/02/2022 PCP: [name], MD    Brief Narrative:   [name] is a 61 y.o. male with medical history significant of CAD, CKD, cirrhosis, diastolic heart failure,, hypertension, hyperlipidemia, iron deficiency anemia, type 2 diabetes mellitus presented to the ED [facility] due to altered mental status.   Patient was reported to be hyperglycemic.  Of note patient was recently discharged on December 19 after admission for GI bleed secondary to varices.  He did have paracentesis while in the hospital at that time and was discharged to skilled nursing facility.  In the ED, patient had stable vitals. ABG showed  pH was 7.5 with pCO2 of 40.  No leukocytosis..  Hemoglobin at 8.0.  Creatinine was slightly elevated.  Patient was hyperglycemic at 416.  Lactic acid was elevated at 3.8.  Patient tested positive for COVID.  CT head scan showed the chronic microvascular changes.  Chest x-ray was negative for acute findings.  Patient was then admitted hospital for further evaluation and treatment.   Assessment and Plan:  Acute metabolic encephalopathy  Likely multifactorial from COVID, dehydration, hepatic encephalopathy.  CT head with microvascular changes.  Ammonia was slightly elevated. D5 drip since the patient is NPO.  No interval changes or improvement noted in his clinical condition.  Continues to be very sick  Hyperammonemia/ history of cirrhosis of liver. Ammonia 61 on presentation.  Currently on supportive care.  Hypokalemia.  On D5 with KCl.  No plans for further blood work.   Volume depletion/ dehydration/hypernatremia. Received IV fluid bolus in the ED.  Lactate was initially at 3.8 with improvement.  On D5 water.  COVID-19 virus infection - COVID-positive.  Chest x-ray without any infiltrate.  No leukocytosis.  Not requiring supplemental oxygen.     Diabetes mellitus type 2 in nonobese [facility]) On D5  water as per family request for hydration.[name]   NASH cirrhosis of liver ([place]) With multiple paracentesis in the past for ascites and recent admission for variceal bleeding.     Atherosclerotic heart disease of native coronary artery without angina pectoris Patient was on aspirin, nadolol, pravastatin, Aldactone.  Focus on symptomatic management at this time.   Hyperlipidemia Patient was on pravastatin.  Acute kidney injury.  On presentation.  Latest creatinine was 1.4.  No plan for further blood work.  Goals of care. Palliative care on board and plan is no escalation of care.  Discussion about hospice on discharge.  Currently focusing on comfort medications/symptomatic care.  Appears to be very sick   DVT prophylaxis:  None for comfort  Code Status:     Code Status: DNR  Disposition: Plan for hospice, TOC and palliative care on board.  Status is: Inpatient Remains inpatient appropriate because: Metabolic encephalopathy, IV fluids, multiple comorbidities, hospice-palliative care status   Family Communication:  Spoke with the patient and multiple family members at bedside at length on 12/09/2022 . Consultants:  Palliative care  Procedures:  None  Antimicrobials:  None  Anti-infectives (From admission, onward)    None       Subjective: Today, patient was seen and examined at bedside.  No interval changes noted.  Still very confused disoriented without any meaningful interaction   Trying to avoid any physical stimuli or interaction.  Moans as a response  Objective: Vitals:   12/10/22 1300 12/10/22 1533 12/10/22 2101 12/11/22 1318  BP: 114/66  112/72 115/76  Pulse: 94  (!) 105 98  Resp: 20  20 20   Temp: 100.1 F (37.8 C) 99.8 F (37.7 C) 99 F (37.2 C) 98.9 F (37.2 C)  TempSrc: Axillary Axillary  Axillary  SpO2: 94%  (!) 89% 90%  Weight:      Height:        Intake/Output Summary (Last 24 hours) at 12/12/2022 0952 Last data filed at 12/12/2022 0704 Gross  per 24 hour  Intake 2923.37 ml  Output 200 ml  Net 2723.37 ml    Filed Weights   12/08/22 0803 12/08/22 0814  Weight: 94.2 kg 94.2 kg    Physical Examination: Body mass index is 29.38 kg/m.   General: Weak, deconditioned, frail appearing, moaning without meaningful interaction. HENT:   No scleral pallor or icterus noted.  Dry oral cavity noted Chest: Diminished breath sounds bilaterally CVS: S1 &S2 heard. No murmur.  Regular rate and rhythm. Abdomen: Soft, distended with some ascites, Extremities: No cyanosis, clubbing trace edema noted bilaterally,  peripheral pulses are palpable. Psych: Anxious debilitated, no meaningful interaction. CNS: Moves all extremities. Skin: Warm and dry.  No rashes noted.  Data Reviewed:   CBC: Recent Labs  Lab 12/04/2022 1922 12/08/22 0651 12/08/22 1725 12/09/22 1622 12/10/22 1510  WBC 6.5 6.4 4.8 5.2 9.3  NEUTROABS 5.4 5.0  --   --   --   HGB 8.0* 8.0* 7.7* 7.7* 7.8*  HCT 24.3* 25.0* 23.9* 24.8* 24.7*  MCV 95.3 98.8 98.4 101.2* 101.6*  PLT 173 121* 134* 128* 114*     Basic Metabolic Panel: Recent Labs  Lab 12/03/2022 1922 12/08/22 0533 12/09/22 0350 12/10/22 0551  NA 135 141 147* 151*  K 3.7 4.2 3.2* 3.0*  CL 90* 98 104 112*  CO2 29 27 32 27  GLUCOSE 416* 305* 313* 346*  BUN 84* 79* 74* 57*  CREATININE 1.85* 1.65* 1.72* 1.47*  CALCIUM 10.3 10.2 10.0 9.9  MG  --   --  2.2 2.2     Liver Function Tests: Recent Labs  Lab 11/26/2022 1922 12/08/22 0533  AST 26 43*  ALT 10 10  ALKPHOS 73 69  BILITOT 4.1* 4.1*  PROT 9.6* 9.1*  ALBUMIN 4.4 4.4      Radiology Studies: No results found.    LOS: 5 days    Flora Lipps, MD [facility] Available via Epic secure chat 7am-7pm After these hours, please refer to coverage provider listed on [email or web address] 12/12/2022, 9:52 AM

## 2022-12-12 NOTE — Progress Notes (Signed)
Pt's wife calling for an update. This RN informed her that patient is unchanged since previous conversation at 2027 but he did have a fever. She asked how high his fever was and this RN informed her that it was 102.8 and that medication was given. Wife became belligerent to this RN because she wasn't informed that he had a fever. This RN offered for her to voice what she would like to be called about if it weren't a major change in his condition. She began yelling that  "you are in charge of if he lives or dies". She asked when the last time he had his "required as needed Morphine". This RN reviewed with her PRN orders and when it would be appropriate to be administered. She was informed that Toradol was given for fever and would help with pain however, patient appears comfortable. She stated "but that's not as strong as Morphine. How would you feel laying in there". He is not groaning or moaning. This RN requested multiple times from wife (caller) that she be respectful to this RN during our conversation. She failed to do so. This RN informed her that the call would be ended. Call was ended by this RN. She calls back and is routed to the [employer]. Bryson Corona Edd Fabian

## 2022-12-12 NOTE — Progress Notes (Signed)
Upon this RN's shift assessment, patient is tachypneic, and responds to touch. He is warm to touch and does not appear to be in pain. CPAP in place. Diminished lungs bilaterally. Bounding radial pulses and weak pedal pulses present. Skin appears jaundice. Calming music played to enhance comfort. [name]

## 2022-12-12 NOTE — Progress Notes (Signed)
Pt's wife requested to speak with patient. This RN placed phone to [name] ear so he could hear her. [name]

## 2022-12-13 DIAGNOSIS — U071 COVID-19: Secondary | ICD-10-CM | POA: Diagnosis not present

## 2022-12-13 DIAGNOSIS — E119 Type 2 diabetes mellitus without complications: Secondary | ICD-10-CM | POA: Diagnosis not present

## 2022-12-13 DIAGNOSIS — G9341 Metabolic encephalopathy: Secondary | ICD-10-CM | POA: Diagnosis not present

## 2022-12-13 DIAGNOSIS — I2510 Atherosclerotic heart disease of native coronary artery without angina pectoris: Secondary | ICD-10-CM | POA: Diagnosis not present

## 2022-12-13 MED ORDER — ACETAMINOPHEN 10 MG/ML IV SOLN
1000.0000 mg | Freq: Once | INTRAVENOUS | Status: AC
  Administered 2022-12-13: 1000 mg via INTRAVENOUS
  Filled 2022-12-13: qty 100

## 2022-12-13 NOTE — Progress Notes (Signed)
Morphine was offered however, wife declines at this time. [name]

## 2022-12-13 NOTE — Progress Notes (Signed)
Morphine administered per wife's request. She reports she feels he is in pain. [name]

## 2022-12-13 NOTE — Progress Notes (Signed)
Patient's wife has repeatedly asked for pain medication for husband who shows no distress all morning temp dropped to 99.5 also requests  vitals and weight taken frequently. This nurse explained we only take vitals Q 24 on comfort care but have taken temp X3 and tech got vitals to appease. Gave Toradol at [phone] and morphine at 1300 explained we did not unnecessarily want to put him in respiratory distress. Reports she forgets often and reminded her of pain meds given and  vitals taken.

## 2022-12-13 NOTE — Progress Notes (Signed)
PROGRESS NOTE    [name]  NMM:[id number] DOB: 05/16/1961 DOA: 11/27/2022 PCP: [name], MD    Brief Narrative:   [name] is a 62 y.o. male with medical history significant of CAD, CKD, cirrhosis, diastolic heart failure, hypertension, hyperlipidemia, iron deficiency anemia, type 2 diabetes mellitus presented to the ED  from  [facility] due to altered mental status.   Patient was reported to be hyperglycemic.  Of note patient was recently discharged on December 19 after admission for GI bleed secondary to varices.  He did have paracentesis while in the hospital at that time and was discharged to skilled nursing facility.  In the ED, patient had stable vitals. ABG showed  pH was 7.5 with pCO2 of 40.  No leukocytosis.  Hemoglobin at 8.0.  Creatinine was slightly elevated.  Patient was hyperglycemic at 416.  Lactic acid was elevated at 3.8.  Patient tested positive for COVID.  CT head scan showed the chronic microvascular changes.  Chest x-ray was negative for acute findings.  Patient was then admitted hospital for further evaluation and treatment.  At this time, patient has been focused on symptomatic care.  Hospice placement pending.   Assessment and Plan:  Acute metabolic encephalopathy  Likely multifactorial from COVID, dehydration, hepatic encephalopathy.  CT head with microvascular changes.  Ammonia was slightly elevated. D5 drip since the patient is NPO.  No clinical improvement noted..  Focus on comfort at this time.  Fever.  Tmax of 102.8 F.  On Tylenol.  Treat symptomatically.  Hyperammonemia/ history of cirrhosis of liver. Ammonia 61 on presentation.  Currently on supportive care.  Hypokalemia.  On D5 with KCl.  No plans for further blood work.   Volume depletion/ dehydration/hypernatremia. Received IV fluid bolus in the ED.  Lactate was initially at 3.8 with improvement.  On D5 water.  COVID-19 virus infection - COVID-positive.  Chest x-ray  without any infiltrate.  No leukocytosis.  Oxygen supplementation for comfort.  Diabetes mellitus type 2 in nonobese [facility]) On D5 water as per family request for hydration.[name]   NASH cirrhosis of liver ([place]) With multiple paracentesis in the past for ascites and recent admission for variceal bleeding.     Atherosclerotic heart disease of native coronary artery without angina pectoris Patient was on aspirin, nadolol, pravastatin, Aldactone.  Focus on symptomatic management at this time.  Currently unable to take p.o.   Hyperlipidemia Patient was on pravastatin as outpatient..  Acute kidney injury.  On presentation.  Latest creatinine was 1.4.  No plan for further blood work.  Goals of care. Palliative care on board and plan is no escalation of care.  Awaiting for hospice on discharge.  Currently focusing on comfort medications/symptomatic care.  Poor prognosis.  Very sick patient.   DVT prophylaxis:  None for comfort  Code Status:     Code Status: DNR  Disposition: Plan for hospice, TOC and palliative care on board.  Status is: Inpatient  Remains inpatient appropriate because: Metabolic encephalopathy, IV fluids, multiple comorbidities, hospice-palliative care status   Family Communication:  Spoke with wife at bedside  . Consultants:  Palliative care  Procedures:  None  Antimicrobials:  None  Anti-infectives (From admission, onward)    None       Subjective: Today, patient was seen and examined at bedside.  Still confused disoriented without meaningful interaction.  Patient's wife at bedside.   Objective: Vitals:   12/12/22 2134 12/13/22 0053 12/13/22 0100 12/13/22 0544  BP: 113/65  Pulse: (!) 120  (!) 116   Resp: (!) 37 (!) 40 (!) 40   Temp: (!) 102.8 F (39.3 C)  (!) 101.1 F (38.4 C) (!) 101.7 F (38.7 C)  TempSrc: Axillary  Axillary Axillary  SpO2: 95% (!) 88%    Weight:      Height:        Intake/Output Summary (Last 24 hours) at 12/13/2022  0735 Last data filed at 12/13/2022 0431 Gross per 24 hour  Intake 1650.53 ml  Output --  Net 1650.53 ml    Filed Weights   12/08/22 0803 12/08/22 0814  Weight: 94.2 kg 94.2 kg    Physical Examination: Body mass index is 29.38 kg/m.   General: Frail-appearing male weak and deconditioned, moaning without meaningful interaction. HENT:   No scleral pallor or icterus noted. Oral mucosa is dry. Chest: Diminished breath sounds bilaterally. CVS: S1 &S2 heard. No murmur.  Regular rate and rhythm. Abdomen: Soft, nontender, nondistended.  Bowel sounds are heard.   Extremities: No cyanosis, clubbing with trace edema peripheral pulses are palpable. Psych: Anxious, debilitated, no meaningful interaction. CNS:  No cranial nerve deficits.  Moves all extremities. Skin: Warm and dry.  No rashes noted.   Data Reviewed:   CBC: Recent Labs  Lab 11/18/2022 1922 12/08/22 0651 12/08/22 1725 12/09/22 1622 12/10/22 1510  WBC 6.5 6.4 4.8 5.2 9.3  NEUTROABS 5.4 5.0  --   --   --   HGB 8.0* 8.0* 7.7* 7.7* 7.8*  HCT 24.3* 25.0* 23.9* 24.8* 24.7*  MCV 95.3 98.8 98.4 101.2* 101.6*  PLT 173 121* 134* 128* 114*     Basic Metabolic Panel: Recent Labs  Lab 11/30/2022 1922 12/08/22 0533 12/09/22 0350 12/10/22 0551  NA 135 141 147* 151*  K 3.7 4.2 3.2* 3.0*  CL 90* 98 104 112*  CO2 29 27 32 27  GLUCOSE 416* 305* 313* 346*  BUN 84* 79* 74* 57*  CREATININE 1.85* 1.65* 1.72* 1.47*  CALCIUM 10.3 10.2 10.0 9.9  MG  --   --  2.2 2.2     Liver Function Tests: Recent Labs  Lab 11/12/2022 1922 12/08/22 0533  AST 26 43*  ALT 10 10  ALKPHOS 73 69  BILITOT 4.1* 4.1*  PROT 9.6* 9.1*  ALBUMIN 4.4 4.4      Radiology Studies: No results found.    LOS: 6 days    Flora Lipps, MD [facility] Available via Epic secure chat 7am-7pm After these hours, please refer to coverage provider listed on [email or web address] 12/13/2022, 7:35 AM

## 2022-12-13 NOTE — Progress Notes (Signed)
Wife is very anxious about her husbands situation. She called me and she talked, repeated the same story, for 37 minutes. I had respiratory assess the patient. I have never seen the patient. They told me his respirations were higher than normal and his oxygen level was lower than it had been. I called the wife and asked her to come be with her husband tonight.

## 2022-12-13 NOTE — Progress Notes (Signed)
Patient is comfort Care ' Changed BiPAP from auto to standard BiPAP 12/9 , increased oxygen to 8 liters. This is for comfort.

## 2022-12-13 NOTE — Progress Notes (Signed)
Dr Josephine Cables ordered IV Tylenol. Wife was updated. She inquires about a dose of Morphine or Ativan for patient. Patient's respirations have increased but he does not appear to be in distress or severe pain. No moaning or groaning.  Will reassess with start of IV Tylenol.  Bryson Corona Edd Fabian

## 2022-12-13 NOTE — Progress Notes (Signed)
Wife at bedside. She has specifically requested something else for fever for patient. This RN reached out to Dr Josephine Cables with wife's request. Franco Nones

## 2022-12-13 DEATH — deceased

## 2022-12-14 DIAGNOSIS — G9341 Metabolic encephalopathy: Secondary | ICD-10-CM | POA: Diagnosis not present

## 2022-12-14 DIAGNOSIS — E860 Dehydration: Secondary | ICD-10-CM | POA: Diagnosis not present

## 2022-12-14 DIAGNOSIS — I2510 Atherosclerotic heart disease of native coronary artery without angina pectoris: Secondary | ICD-10-CM | POA: Diagnosis not present

## 2022-12-14 DIAGNOSIS — U071 COVID-19: Secondary | ICD-10-CM | POA: Diagnosis not present

## 2022-12-14 MED ORDER — ACETAMINOPHEN 650 MG RE SUPP: 650.0000 mg | Freq: Every day | RECTAL | 0 refills | Status: AC | PRN

## 2022-12-14 MED ORDER — ONDANSETRON HCL 4 MG PO TABS: 4.0000 mg | ORAL_TABLET | Freq: Four times a day (QID) | ORAL | 0 refills | Status: AC | PRN

## 2022-12-14 MED ORDER — BIOTENE DRY MOUTH MT LIQD: 15.0000 mL | OROMUCOSAL | Status: AC | PRN

## 2022-12-16 ENCOUNTER — Inpatient Hospital Stay: Payer: Federal, State, Local not specified - PPO

## 2022-12-23 ENCOUNTER — Ambulatory Visit: Payer: Federal, State, Local not specified - PPO | Admitting: Physician Assistant

## 2022-12-24 ENCOUNTER — Ambulatory Visit: Payer: Federal, State, Local not specified - PPO | Admitting: Internal Medicine

## 2023-01-07 ENCOUNTER — Ambulatory Visit: Payer: Federal, State, Local not specified - PPO | Admitting: Internal Medicine

## 2023-01-12 ENCOUNTER — Ambulatory Visit: Payer: Federal, State, Local not specified - PPO | Admitting: Cardiology

## 2023-01-13 NOTE — Progress Notes (Signed)
Report called to Airline pilot at [facility].

## 2023-01-13 NOTE — Progress Notes (Signed)
Pt resting comfortably in bed throughout this shift. No signs of pain or discomfort noted, no moaning or facial grimacing. IV fluids continue at 140m/hr. Pt wife called around 0300 for update on pt, this writer informed her there were no new changes. She asked that we let her know of any changes at all regarding pt, this writer informed her I would document and pass this information along. Plan of care ongoing.

## 2023-01-13 NOTE — Progress Notes (Signed)
   January 10, 2023 1600  Attending [place]  Attending Physician Notified Y  Attending Physician (First and Last Name) Flora Lipps  Post Mortem Checklist  Date of Death 01/10/2023  Time of Death 54  Pronounced By Nurse, [facility] Ridge Lafond RN  Next of kin notified Yes  Name of next of kin notified of death [place] Bon  Contact Person's Relationship to Patient Spouse  Contact Person's Phone Number [phone]  Contact Person's address [place]  Was the patient a No Code Blue or a Limited Code Blue? Yes  Did the patient die unattended? No  Patient restrained? Not applicable  Body preparation complete N  [employer] (previously known as Brewing technologist)  Notification Date January 10, 2023  Notification Time 1627  [employer] Number [phone]  Is patient a potential donor? N  Autopsy  Autopsy requested by MD or Family ( Non ME Case) N/A  Patient and [place] Returned  Patient is satisfied that all belongings have been returned? Yes  Name of person receiving valuables? [name] valuables returned clothes  Dermatherapy linen/gowns NOT sent with patient or transporter Not applicable  Dead on Arrival (Emergency Department)  Patient dead on arrival? No  Notifications  Patient Placement notified that Post Mortem checklist is complete Yes  Medical Examiner  Is this a medical examiner's case? [facility] home name/address/phone # [facility] - [address] Linna Hoff [facility] - [phone]  Planned location of pickup [facility]

## 2023-01-13 NOTE — Consult Note (Signed)
   [facility] Inpatient Consult   01/05/2023  ETHELBERT THAIN 18-Sep-1961 [id number]  [facility] [[facility]] Patient: [facility] Liaison coverage for patient at [facility]  Chart reviewed and reveals the patient is currently transitioning to Hospice/Palliative Care facility.   Plan: Patient will have full case management services through Hospice and needs will be met at the hospice level of care. No [facility] Care Management is planned for transitional needs. Will sign off at transition from hospital.  For questions,   Natividad Brood, RN BSN [place]  [phone] business mobile phone Toll free office [phone]  *[place]  [phone] Fax number: [phone] [email or web address]

## 2023-01-13 NOTE — Discharge Summary (Signed)
Physician Discharge Summary  [name] UXY:[id number] DOB: 12/26/1960 DOA: 12/02/2022  PCP: [name], MD  Admit date: 11/24/2022 Discharge date: Dec 23, 2022  Admitted From: Home  Discharge disposition: Hospice facility   Recommendations for Outpatient Follow-Up:   Further care as per hospice facility  Discharge Diagnosis:   Principal Problem:   Acute metabolic encephalopathy Active Problems:   Hyperlipidemia   Atherosclerotic heart disease of native coronary artery without angina pectoris   NASH cirrhosis of liver (HCC)   Diabetes mellitus type 2 in nonobese ([place])   Pressure injury of skin   COVID-19 virus infection   Dehydration   Hyperammonemia ([place])   Discharge Condition: Improved.  Diet recommendation: As tolerated  Wound care: None.  Code status: Full.   History of Present Illness:   [name] is a 62 y.o. male with medical history significant of CAD, CKD, cirrhosis, diastolic heart failure, hypertension, hyperlipidemia, iron deficiency anemia, type 2 diabetes mellitus presented to the ED  from  [facility] due to altered mental status.   Patient was reported to be hyperglycemic.  Of note patient was recently discharged on December 19 after admission for GI bleed secondary to varices.  He did have paracentesis while in the hospital at that time and was discharged to skilled nursing facility.  In the ED, patient had stable vitals. ABG showed  pH was 7.5 with pCO2 of 40.  No leukocytosis.  Hemoglobin at 8.0.  Creatinine was slightly elevated.  Patient was hyperglycemic at 416.  Lactic acid was elevated at 3.8.  Patient tested positive for COVID.  CT head scan showed the chronic microvascular changes.  Chest x-ray was negative for acute findings.  Patient was then admitted hospital for further evaluation and treatment. During hospitalization patient's mental capacity and overall clinical condition did not improve and patient continued to  decline so palliative care was consulted and at this time plan for hospice.     Hospital Course:   Following conditions were addressed during hospitalization as listed below,  Acute metabolic encephalopathy  Likely multifactorial from COVID, dehydration, hepatic encephalopathy.  On improved.  CT head with microvascular changes.  Ammonia was slightly elevated. D5 drip since the patient is NPO.  No clinical improvement noted..  Focus on comfort at this time.   Fever.  Tmax of 102.8 F.  On Tylenol.  Treat symptomatically.  Latest temperature of 100.5 F.   Hyperammonemia/ history of cirrhosis of liver. Ammonia 63 on presentation.  Currently on supportive care.   Hypokalemia.  Received on D5 with KCl patient.  No plans for further blood work.   Volume depletion/ dehydration/hypernatremia. Received IV fluid bolus in the ED.  Lactate was initially at 3.8 with improvement.  Received D5 water during hospitalization..  On Foley catheter for comfort.   COVID-19 virus infection - COVID-positive.  Chest x-ray without any infiltrate.  No leukocytosis.  Oxygen supplementation for comfort.   Diabetes mellitus type 2 in nonobese (HCC) Received on D5 water as per family request for hydration.[name]   NASH cirrhosis of liver ([place]) With multiple paracentesis in the past for ascites and recent admission for variceal bleeding.  On rectal tube.   Atherosclerotic heart disease of native coronary artery without angina pectoris Patient was on aspirin, nadolol, pravastatin, Aldactone.  Focus on symptomatic management at this time.  Currently unable to take p.o.   Hyperlipidemia Patient was on pravastatin as outpatient..  Currently NPO.   Acute kidney injury.  On presentation.  Latest creatinine  was 1.4.  No plan for further blood work.  Disposition.  At this time, patient is stable for disposition to hospice facility.  Medical Consultants:   Palliative care  Procedures:    None Subjective:    Today, patient was seen and examined at bedside.  No clinical improvement noted in his mental Status.  Still moans without any meaningful interaction.    Discharge Exam:   Vitals:   12/13/22 2048 01-05-2023 0425  BP: (!) 91/56 92/61  Pulse: 88 81  Resp: 18 (!) 22  Temp: 98.7 F (37.1 C) 98.8 F (37.1 C)  SpO2: 99% 98%   Vitals:   12/13/22 1300 12/13/22 1353 12/13/22 2048 01-05-2023 0425  BP:  (!) 91/59 (!) 91/56 92/61  Pulse:  93 88 81  Resp:  (!) 32 18 (!) 22  Temp: 100.1 F (37.8 C) (!) 100.5 F (38.1 C) 98.7 F (37.1 C) 98.8 F (37.1 C)  TempSrc: Axillary Axillary    SpO2:  94% 99% 98%  Weight:      Height:        General: Frail-appearing weak male morning only, no meaningful interaction,  HENT:   No scleral pallor or icterus noted. Oral mucosa is dry. Chest: Diminished breath sounds bilaterally.  Coarse breath sounds noted CVS: S1 &S2 heard. No murmur.  Regular rate and rhythm. Abdomen: Soft, nontender, mildly distended with ascites bowel sounds are heard.   Extremities: No cyanosis, clubbing with trace edema peripheral pulses are palpable. Psych: Anxious, debilitated, no meaningful interaction. CNS: Moaning, moves all extremities, no meaningful interaction. Skin: Warm and dry.    The results of significant diagnostics from this hospitalization (including imaging, microbiology, ancillary and laboratory) are listed below for reference.     Diagnostic Studies:   CT Head Wo Contrast  Result Date: 12/01/2022 CLINICAL DATA:  Mental status change, unknown cause EXAM: CT HEAD WITHOUT CONTRAST TECHNIQUE: Contiguous axial images were obtained from the base of the skull through the vertex without intravenous contrast. RADIATION DOSE REDUCTION: This exam was performed according to the departmental dose-optimization program which includes automated exposure control, adjustment of the mA and/or kV according to patient size and/or use of iterative reconstruction technique.  COMPARISON:  10/28/2022 FINDINGS: Technical note: Examination is degraded by patient motion artifact, particularly at the skull base and posterior fossa. Examination was repeated once with slight improvement in degree of artifact. Brain: No evidence of acute infarction, hemorrhage, hydrocephalus, extra-axial collection or mass lesion/mass effect. Scattered low-density changes within the periventricular and subcortical white matter compatible with chronic microvascular ischemic change. Mild diffuse cerebral volume loss. Vascular: Atherosclerotic calcifications involving the large vessels of the skull base. No unexpected hyperdense vessel. Skull: No evidence of acute calvarial fracture. Sinuses/Orbits: Right maxillary sinus mucosal thickening. Other: None. IMPRESSION: 1. No acute intracranial findings. 2. Chronic microvascular ischemic change and cerebral volume loss. 3. Right maxillary sinus disease. Electronically Signed   By: Davina Poke D.O.   On: 11/21/2022 18:58   DG Chest 1 View  Result Date: 11/25/2022 CLINICAL DATA:  Altered mental status. EXAM: CHEST  1 VIEW COMPARISON:  November 26, 2022 FINDINGS: The heart size and mediastinal contours are within normal limits. Both lungs are clear. The visualized skeletal structures are unremarkable. IMPRESSION: No active disease. Electronically Signed   By: Dorise Bullion III M.D.   On: 12/01/2022 18:55     Labs:   Basic Metabolic Panel: Recent Labs  Lab 11/16/2022 1922 12/08/22 0533 12/09/22 0350 12/10/22 0551  NA 135 141  147* 151*  K 3.7 4.2 3.2* 3.0*  CL 90* 98 104 112*  CO2 29 27 32 27  GLUCOSE 416* 305* 313* 346*  BUN 84* 79* 74* 57*  CREATININE 1.85* 1.65* 1.72* 1.47*  CALCIUM 10.3 10.2 10.0 9.9  MG  --   --  2.2 2.2   GFR Estimated Creatinine Clearance: 59.8 mL/min (A) (by C-G formula based on SCr of 1.47 mg/dL (H)). Liver Function Tests: Recent Labs  Lab 12/02/2022 1922 12/08/22 0533  AST 26 43*  ALT 10 10  ALKPHOS 73 69   BILITOT 4.1* 4.1*  PROT 9.6* 9.1*  ALBUMIN 4.4 4.4   No results for input(s): "LIPASE", "AMYLASE" in the last 168 hours. Recent Labs  Lab 11/22/2022 1922 12/08/22 0533  AMMONIA 63* 24   Coagulation profile No results for input(s): "INR", "PROTIME" in the last 168 hours.  CBC: Recent Labs  Lab 11/23/2022 1922 12/08/22 0651 12/08/22 1725 12/09/22 1622 12/10/22 1510  WBC 6.5 6.4 4.8 5.2 9.3  NEUTROABS 5.4 5.0  --   --   --   HGB 8.0* 8.0* 7.7* 7.7* 7.8*  HCT 24.3* 25.0* 23.9* 24.8* 24.7*  MCV 95.3 98.8 98.4 101.2* 101.6*  PLT 173 121* 134* 128* 114*   Cardiac Enzymes: Recent Labs  Lab 11/23/2022 1922  CKTOTAL 16*   BNP: Invalid input(s): "POCBNP" CBG: Recent Labs  Lab 12/09/22 1653 12/09/22 2014 12/10/22 0714 12/10/22 1102 12/10/22 1604  GLUCAP 228* 202* 305* 302* 290*   D-Dimer No results for input(s): "DDIMER" in the last 72 hours. Hgb A1c No results for input(s): "HGBA1C" in the last 72 hours. Lipid Profile No results for input(s): "CHOL", "HDL", "LDLCALC", "TRIG", "CHOLHDL", "LDLDIRECT" in the last 72 hours. Thyroid function studies No results for input(s): "TSH", "T4TOTAL", "T3FREE", "THYROIDAB" in the last 72 hours.  Invalid input(s): "FREET3" Anemia work up No results for input(s): "VITAMINB12", "FOLATE", "FERRITIN", "TIBC", "IRON", "RETICCTPCT" in the last 72 hours. Microbiology Recent Results (from the past 240 hour(s))  Resp panel by RT-PCR (RSV, Flu A&B, Covid) Anterior Nasal Swab     Status: Abnormal   Collection Time: 12/01/2022  6:19 PM   Specimen: Anterior Nasal Swab  Result Value Ref Range Status   SARS Coronavirus 2 by RT PCR POSITIVE (A) NEGATIVE Final    Comment: (NOTE) SARS-CoV-2 target nucleic acids are DETECTED.  The SARS-CoV-2 RNA is generally detectable in upper respiratory specimens during the acute phase of infection. Positive results are indicative of the presence of the identified virus, but do not rule out bacterial infection  or co-infection with other pathogens not detected by the test. Clinical correlation with patient history and other diagnostic information is necessary to determine patient infection status. The expected result is Negative.  Fact Sheet for Patients: EntrepreneurPulse.com.au  Fact Sheet for Healthcare Providers: IncredibleEmployment.be  This test is not yet approved or cleared by the [place] FDA and  has been authorized for detection and/or diagnosis of SARS-CoV-2 by FDA under an Emergency Use Authorization (EUA).  This EUA will remain in effect (meaning this test can be used) for the duration of  the COVID-19 declaration under Section 564(b)(1) of the A ct, 21 U.S.C. section 360bbb-3(b)(1), unless the authorization is terminated or revoked sooner.     Influenza A by PCR NEGATIVE NEGATIVE Final   Influenza B by PCR NEGATIVE NEGATIVE Final    Comment: (NOTE) The Xpert Xpress SARS-CoV-2/FLU/RSV plus assay is intended as an aid in the diagnosis of influenza from Nasopharyngeal swab  specimens and should not be used as a sole basis for treatment. Nasal washings and aspirates are unacceptable for Xpert Xpress SARS-CoV-2/FLU/RSV testing.  Fact Sheet for Patients: EntrepreneurPulse.com.au  Fact Sheet for Healthcare Providers: IncredibleEmployment.be  This test is not yet approved or cleared by the [place] FDA and has been authorized for detection and/or diagnosis of SARS-CoV-2 by FDA under an Emergency Use Authorization (EUA). This EUA will remain in effect (meaning this test can be used) for the duration of the COVID-19 declaration under Section 564(b)(1) of the Act, 21 U.S.C. section 360bbb-3(b)(1), unless the authorization is terminated or revoked.     Resp Syncytial Virus by PCR NEGATIVE NEGATIVE Final    Comment: (NOTE) Fact Sheet for Patients: EntrepreneurPulse.com.au  Fact  Sheet for Healthcare Providers: IncredibleEmployment.be  This test is not yet approved or cleared by the [place] FDA and has been authorized for detection and/or diagnosis of SARS-CoV-2 by FDA under an Emergency Use Authorization (EUA). This EUA will remain in effect (meaning this test can be used) for the duration of the COVID-19 declaration under Section 564(b)(1) of the Act, 21 U.S.C. section 360bbb-3(b)(1), unless the authorization is terminated or revoked.  Performed at [facility], [address] [place], [place] [address]      Discharge Instructions:   Discharge Instructions     Diet - low sodium heart healthy   Complete by: As directed    Discharge instructions   Complete by: As directed    Further care as per hospice facility.   Increase activity slowly   Complete by: As directed    No wound care   Complete by: As directed       Allergies as of 2022/12/15       Reactions   Fluticasone Rash        Medication List     STOP taking these medications    Admelog SoloStar 100 UNIT/ML KwikPen Generic drug: insulin lispro   ALPRAZolam 0.25 MG tablet Commonly known as: XANAX   Armodafinil 50 MG tablet   aspirin EC 81 MG tablet   Dexcom G5 Receiver Kit Devi   insulin degludec 100 UNIT/ML FlexTouch Pen Commonly known as: TRESIBA   loratadine 10 MG tablet Commonly known as: CLARITIN   metolazone 2.5 MG tablet Commonly known as: ZAROXOLYN   midodrine 10 MG tablet Commonly known as: PROAMATINE   nadolol 20 MG tablet Commonly known as: CORGARD   nitroGLYCERIN 0.4 MG SL tablet Commonly known as: NITROSTAT   oxyCODONE 5 MG immediate release tablet Commonly known as: Oxy IR/ROXICODONE   potassium chloride SA 20 MEQ tablet Commonly known as: KLOR-CON M   pravastatin 20 MG tablet Commonly known as: PRAVACHOL   spironolactone 100 MG tablet Commonly known as: ALDACTONE   Torsemide 60 MG Tabs   venlafaxine XR 75 MG 24 hr  capsule Commonly known as: EFFEXOR-XR   Vitamin D (Ergocalciferol) 1.25 MG (50000 UNIT) Caps capsule Commonly known as: DRISDOL       TAKE these medications    acetaminophen 650 MG suppository Commonly known as: TYLENOL Place 1 suppository (650 mg total) rectally daily as needed for fever.   albuterol 108 (90 Base) MCG/ACT inhaler Commonly known as: VENTOLIN HFA Inhale 2 puffs into the lungs every 6 (six) hours as needed for wheezing or shortness of breath.   antiseptic oral rinse Liqd Apply 15 mLs topically as needed for dry mouth.   lactulose 10 GM/15ML solution Commonly known as: CHRONULAC Take 30 mLs (20  g total) by mouth daily.   ondansetron 4 MG tablet Commonly known as: ZOFRAN Take 1 tablet (4 mg total) by mouth every 6 (six) hours as needed for nausea.   pantoprazole 40 MG tablet Commonly known as: PROTONIX Take 1 tablet (40 mg total) by mouth daily.          Time coordinating discharge: 39 minutes  Signed:  Alannah Averhart  [facility] 2023/01/09, 10:58 AM

## 2023-01-13 NOTE — TOC Transition Note (Signed)
Transition of Care [facility]) - CM/SW Discharge Note   Patient Details  Name: [name] MRN: [id number] Date of Birth: 1961-07-31  Transition of Care [facility]) CM/SW Contact:  Iona Beard, [place] Phone Number: 2022/12/25, 11:39 AM   Clinical Narrative:    CSW spoke to [employer] with [facility] who states pt can arrive to their facility today. Luetta Nutting is working to get consents signed by patients families. D/C has been completed. CSW to update RN of plan for D/C. Hospice to call for transport once consents have been signed. TOC signing off.   Final next level of care: [place] Barriers to Discharge: Barriers Resolved   Patient Goals and CMS Choice CMS Medicare.gov Compare Post Acute Care list provided to:: Patient Represenative (must comment) Choice offered to / list presented to : Spouse  Discharge Placement                  Patient to be transferred to facility by: EMS      Discharge Plan and Services Additional resources added to the After Visit Summary for                                       Social Determinants of Health (SDOH) Interventions SDOH Screenings   Food Insecurity: No Food Insecurity (12/08/2022)  Housing: Low Risk  (12/08/2022)  Transportation Needs: No Transportation Needs (12/08/2022)  Utilities: Not At Risk (12/08/2022)  Depression (PHQ2-9): Low Risk  (07/24/2020)  Tobacco Use: Low Risk  (12/02/2022)     Readmission Risk Interventions    11/22/2022    8:40 AM 08/25/2021    2:25 PM 07/22/2021   11:48 AM  Readmission Risk Prevention Plan  Transportation Screening Complete Complete Complete  HRI or Home Care Consult   Complete  Social Work Consult for [employer] Planning/Counseling   Complete  Palliative Care Screening   Not Applicable  Medication Review Press photographer) Complete Complete Complete  HRI or Home Care Consult Complete Complete   SW Recovery Care/Counseling Consult Complete Complete   Palliative  Care Screening Not Applicable Not Applicable   Skilled Nursing Facility Not Applicable Complete

## 2023-01-13 NOTE — Progress Notes (Signed)
PROGRESS NOTE    [name]  [id number] DOB: [birth year] DOA: 11/28/2022 PCP: [name], MD    Brief Narrative:   [name] is a 62 y.o. male with medical history significant of CAD, CKD, cirrhosis, diastolic heart failure, hypertension, hyperlipidemia, iron deficiency anemia, type 2 diabetes mellitus presented to the ED  from  [facility] due to altered mental status.   Patient was reported to be hyperglycemic.  Of note patient was recently discharged on December 19 after admission for GI bleed secondary to varices.  He did have paracentesis while in the hospital at that time and was discharged to skilled nursing facility.  In the ED, patient had stable vitals. ABG showed  pH was 7.5 with pCO2 of 40.  No leukocytosis.  Hemoglobin at 8.0.  Creatinine was slightly elevated.  Patient was hyperglycemic at 416.  Lactic acid was elevated at 3.8.  Patient tested positive for COVID.  CT head scan showed the chronic microvascular changes.  Chest x-ray was negative for acute findings.  Patient was then admitted hospital for further evaluation and treatment.  During hospitalization patient's mental capacity has not improved.  Palliative care has seen the patient.  At this time, patient has been focused on symptomatic care.  Hospice placement pending.   Assessment and Plan:  Acute metabolic encephalopathy  Likely multifactorial from COVID, dehydration, hepatic encephalopathy.  On improved.  CT head with microvascular changes.  Ammonia was slightly elevated. D5 drip since the patient is NPO.  No clinical improvement noted..  Focus on comfort at this time.  Fever.  Tmax of 102.8 F.  On Tylenol.  Treat symptomatically.  Latest temperature of 100.5 F.  Hyperammonemia/ history of cirrhosis of liver. Ammonia 63 on presentation.  Currently on supportive care.  Hypokalemia.  On D5 with KCl.  No plans for further blood work.   Volume depletion/  dehydration/hypernatremia. Received IV fluid bolus in the ED.  Lactate was initially at 3.8 with improvement.  On D5 water.  On Foley catheter for comfort.  COVID-19 virus infection - COVID-positive.  Chest x-ray without any infiltrate.  No leukocytosis.  Oxygen supplementation for comfort.  Diabetes mellitus type 2 in nonobese [facility]) On D5 water as per family request for hydration.[name]   NASH cirrhosis of liver ([place]) With multiple paracentesis in the past for ascites and recent admission for variceal bleeding.  On rectal tube.   Atherosclerotic heart disease of native coronary artery without angina pectoris Patient was on aspirin, nadolol, pravastatin, Aldactone.  Focus on symptomatic management at this time.  Currently unable to take p.o.   Hyperlipidemia Patient was on pravastatin as outpatient..  Currently NPO.  Acute kidney injury.  On presentation.  Latest creatinine was 1.4.  No plan for further blood work.  Goals of care. Palliative care on board and plan is no escalation of care.  Awaiting for hospice on discharge.  Currently focusing on comfort medications/symptomatic care.  Poor prognosis.     DVT prophylaxis:  None for comfort  Code Status:     Code Status: DNR  Disposition: Plan for hospice, TOC and palliative care on board.  Status is: Inpatient  Remains inpatient appropriate because: Metabolic encephalopathy, IV fluids, multiple comorbidities, hospice-palliative care status pending hospice placement   Family Communication:  Spoke with wife at bedside on 12/13/2022 . Consultants:  Palliative care  Procedures:  None  Antimicrobials:  None  Anti-infectives (From admission, onward)    None       Subjective:  Today, patient was seen and examined at bedside.  No clinical improvement noted in his mental Status.  Still moans without any meaningful interaction.   Objective: Vitals:   12/13/22 1300 12/13/22 1353 12/13/22 2048 Dec 22, 2022 0425  BP:  (!) 91/59  (!) 91/56 92/61  Pulse:  93 88 81  Resp:  (!) 32 18 (!) 22  Temp: 100.1 F (37.8 C) (!) 100.5 F (38.1 C) 98.7 F (37.1 C) 98.8 F (37.1 C)  TempSrc: Axillary Axillary    SpO2:  94% 99% 98%  Weight:      Height:        Intake/Output Summary (Last 24 hours) at 12/22/22 1048 Last data filed at 12/22/2022 0500 Gross per 24 hour  Intake 765.23 ml  Output 1160 ml  Net -394.77 ml    Filed Weights   12/08/22 0803 12/08/22 0814  Weight: 94.2 kg 94.2 kg    Physical Examination: Body mass index is 29.38 kg/m.   General: Frail-appearing weak male morning only, no meaningful interaction,  HENT:   No scleral pallor or icterus noted. Oral mucosa is dry. Chest: Diminished breath sounds bilaterally.  Coarse breath sounds noted CVS: S1 &S2 heard. No murmur.  Regular rate and rhythm. Abdomen: Soft, nontender, mildly distended with ascites bowel sounds are heard.   Extremities: No cyanosis, clubbing with trace edema peripheral pulses are palpable. Psych: Anxious, debilitated, no meaningful interaction. CNS: Moaning, moves all extremities, no meaningful interaction. Skin: Warm and dry.     Data Reviewed:   CBC: Recent Labs  Lab 12/11/2022 1922 12/08/22 0651 12/08/22 1725 12/09/22 1622 12/10/22 1510  WBC 6.5 6.4 4.8 5.2 9.3  NEUTROABS 5.4 5.0  --   --   --   HGB 8.0* 8.0* 7.7* 7.7* 7.8*  HCT 24.3* 25.0* 23.9* 24.8* 24.7*  MCV 95.3 98.8 98.4 101.2* 101.6*  PLT 173 121* 134* 128* 114*     Basic Metabolic Panel: Recent Labs  Lab 12/08/2022 1922 12/08/22 0533 12/09/22 0350 12/10/22 0551  NA 135 141 147* 151*  K 3.7 4.2 3.2* 3.0*  CL 90* 98 104 112*  CO2 29 27 32 27  GLUCOSE 416* 305* 313* 346*  BUN 84* 79* 74* 57*  CREATININE 1.85* 1.65* 1.72* 1.47*  CALCIUM 10.3 10.2 10.0 9.9  MG  --   --  2.2 2.2     Liver Function Tests: Recent Labs  Lab 11/29/2022 1922 12/08/22 0533  AST 26 43*  ALT 10 10  ALKPHOS 73 69  BILITOT 4.1* 4.1*  PROT 9.6* 9.1*  ALBUMIN 4.4 4.4       Radiology Studies: No results found.    LOS: 7 days    Flora Lipps, MD [facility] Available via Epic secure chat 7am-7pm After these hours, please refer to coverage provider listed on [email or web address] 12/22/2022, 10:48 AM

## 2023-01-13 DEATH — deceased

## 2023-06-02 ENCOUNTER — Other Ambulatory Visit: Payer: Federal, State, Local not specified - PPO

## 2023-06-09 ENCOUNTER — Ambulatory Visit: Payer: Federal, State, Local not specified - PPO | Admitting: Family Medicine
